# Patient Record
Sex: Female | Born: 1960
Health system: Southern US, Community
[De-identification: ages and names within clinical notes are randomized; demographics above are authoritative.]

## PROBLEM LIST (undated history)

## (undated) DIAGNOSIS — S82892A Other fracture of left lower leg, initial encounter for closed fracture: Secondary | ICD-10-CM

## (undated) DIAGNOSIS — K3184 Gastroparesis: Secondary | ICD-10-CM

## (undated) DIAGNOSIS — I779 Disorder of arteries and arterioles, unspecified: Secondary | ICD-10-CM

## (undated) DIAGNOSIS — N189 Chronic kidney disease, unspecified: Secondary | ICD-10-CM

## (undated) DIAGNOSIS — D72819 Decreased white blood cell count, unspecified: Secondary | ICD-10-CM

## (undated) DIAGNOSIS — I2510 Atherosclerotic heart disease of native coronary artery without angina pectoris: Secondary | ICD-10-CM

## (undated) DIAGNOSIS — N186 End stage renal disease: Secondary | ICD-10-CM

## (undated) DIAGNOSIS — I209 Angina pectoris, unspecified: Secondary | ICD-10-CM

## (undated) DIAGNOSIS — I739 Peripheral vascular disease, unspecified: Secondary | ICD-10-CM

## (undated) DIAGNOSIS — I10 Essential (primary) hypertension: Secondary | ICD-10-CM

## (undated) DIAGNOSIS — I509 Heart failure, unspecified: Secondary | ICD-10-CM

## (undated) DIAGNOSIS — E041 Nontoxic single thyroid nodule: Secondary | ICD-10-CM

## (undated) DIAGNOSIS — E1149 Type 2 diabetes mellitus with other diabetic neurological complication: Secondary | ICD-10-CM

## (undated) DIAGNOSIS — F329 Major depressive disorder, single episode, unspecified: Secondary | ICD-10-CM

## (undated) DIAGNOSIS — E785 Hyperlipidemia, unspecified: Secondary | ICD-10-CM

## (undated) DIAGNOSIS — Z992 Dependence on renal dialysis: Secondary | ICD-10-CM

## (undated) DIAGNOSIS — R1310 Dysphagia, unspecified: Secondary | ICD-10-CM

## (undated) DIAGNOSIS — H9190 Unspecified hearing loss, unspecified ear: Secondary | ICD-10-CM

## (undated) DIAGNOSIS — K589 Irritable bowel syndrome without diarrhea: Secondary | ICD-10-CM

## (undated) DIAGNOSIS — R0600 Dyspnea, unspecified: Secondary | ICD-10-CM

## (undated) DIAGNOSIS — R011 Cardiac murmur, unspecified: Secondary | ICD-10-CM

## (undated) DIAGNOSIS — B029 Zoster without complications: Secondary | ICD-10-CM

## (undated) DIAGNOSIS — D649 Anemia, unspecified: Secondary | ICD-10-CM

## (undated) HISTORY — DX: Other fracture of left lower leg, initial encounter for closed fracture: S82.892A

## (undated) HISTORY — DX: Hyperlipidemia, unspecified: E78.5

## (undated) HISTORY — PX: COLONOSCOPY: SHX174

## (undated) HISTORY — DX: Irritable bowel syndrome without diarrhea: K58.9

## (undated) HISTORY — DX: Zoster without complications: B02.9

## (undated) HISTORY — DX: Nontoxic single thyroid nodule: E04.1

## (undated) HISTORY — DX: Decreased white blood cell count, unspecified: D72.819

## (undated) HISTORY — DX: Anemia, unspecified: D64.9

---

## 1995-12-18 DIAGNOSIS — S82892A Other fracture of left lower leg, initial encounter for closed fracture: Secondary | ICD-10-CM

## 1995-12-18 HISTORY — DX: Other fracture of left lower leg, initial encounter for closed fracture: S82.892A

## 1999-11-06 ENCOUNTER — Other Ambulatory Visit: Admission: RE | Admit: 1999-11-06 | Discharge: 1999-11-06 | Payer: Self-pay | Admitting: Gynecology

## 2000-03-27 ENCOUNTER — Encounter (INDEPENDENT_AMBULATORY_CARE_PROVIDER_SITE_OTHER): Payer: Self-pay | Admitting: *Deleted

## 2000-03-27 ENCOUNTER — Other Ambulatory Visit: Admission: RE | Admit: 2000-03-27 | Discharge: 2000-03-27 | Payer: Self-pay | Admitting: Gynecology

## 2000-11-14 ENCOUNTER — Inpatient Hospital Stay (HOSPITAL_COMMUNITY): Admission: EM | Admit: 2000-11-14 | Discharge: 2000-11-15 | Payer: Self-pay | Admitting: Emergency Medicine

## 2000-12-17 DIAGNOSIS — K589 Irritable bowel syndrome without diarrhea: Secondary | ICD-10-CM

## 2000-12-17 HISTORY — DX: Irritable bowel syndrome, unspecified: K58.9

## 2001-03-03 ENCOUNTER — Encounter: Admission: RE | Admit: 2001-03-03 | Discharge: 2001-03-03 | Payer: Self-pay | Admitting: Family Medicine

## 2001-03-03 ENCOUNTER — Encounter: Payer: Self-pay | Admitting: Family Medicine

## 2001-03-04 ENCOUNTER — Other Ambulatory Visit: Admission: RE | Admit: 2001-03-04 | Discharge: 2001-03-04 | Payer: Self-pay | Admitting: *Deleted

## 2001-12-01 ENCOUNTER — Encounter: Payer: Self-pay | Admitting: Family Medicine

## 2001-12-01 ENCOUNTER — Encounter: Admission: RE | Admit: 2001-12-01 | Discharge: 2001-12-01 | Payer: Self-pay | Admitting: Family Medicine

## 2002-02-23 ENCOUNTER — Other Ambulatory Visit: Admission: RE | Admit: 2002-02-23 | Discharge: 2002-02-23 | Payer: Self-pay | Admitting: *Deleted

## 2002-02-27 ENCOUNTER — Encounter: Payer: Self-pay | Admitting: Family Medicine

## 2002-02-27 ENCOUNTER — Encounter: Admission: RE | Admit: 2002-02-27 | Discharge: 2002-02-27 | Payer: Self-pay | Admitting: Family Medicine

## 2002-10-22 ENCOUNTER — Observation Stay (HOSPITAL_COMMUNITY): Admission: RE | Admit: 2002-10-22 | Discharge: 2002-10-23 | Payer: Self-pay | Admitting: Obstetrics and Gynecology

## 2002-10-22 ENCOUNTER — Encounter (INDEPENDENT_AMBULATORY_CARE_PROVIDER_SITE_OTHER): Payer: Self-pay

## 2002-12-17 DIAGNOSIS — E041 Nontoxic single thyroid nodule: Secondary | ICD-10-CM

## 2002-12-17 DIAGNOSIS — H9190 Unspecified hearing loss, unspecified ear: Secondary | ICD-10-CM

## 2002-12-17 HISTORY — DX: Unspecified hearing loss, unspecified ear: H91.90

## 2002-12-17 HISTORY — DX: Nontoxic single thyroid nodule: E04.1

## 2003-04-16 ENCOUNTER — Encounter: Payer: Self-pay | Admitting: Family Medicine

## 2003-04-16 ENCOUNTER — Encounter: Admission: RE | Admit: 2003-04-16 | Discharge: 2003-04-16 | Payer: Self-pay | Admitting: Family Medicine

## 2003-08-26 ENCOUNTER — Other Ambulatory Visit: Admission: RE | Admit: 2003-08-26 | Discharge: 2003-08-26 | Payer: Self-pay | Admitting: Pediatrics

## 2003-12-18 HISTORY — PX: ABDOMINAL HYSTERECTOMY: SHX81

## 2004-04-05 ENCOUNTER — Encounter: Admission: RE | Admit: 2004-04-05 | Discharge: 2004-07-04 | Payer: Self-pay | Admitting: Family Medicine

## 2004-12-17 DIAGNOSIS — E785 Hyperlipidemia, unspecified: Secondary | ICD-10-CM

## 2004-12-17 DIAGNOSIS — I10 Essential (primary) hypertension: Secondary | ICD-10-CM

## 2004-12-17 DIAGNOSIS — E1149 Type 2 diabetes mellitus with other diabetic neurological complication: Secondary | ICD-10-CM

## 2004-12-17 DIAGNOSIS — D649 Anemia, unspecified: Secondary | ICD-10-CM

## 2004-12-17 HISTORY — DX: Anemia, unspecified: D64.9

## 2004-12-17 HISTORY — DX: Hyperlipidemia, unspecified: E78.5

## 2004-12-17 HISTORY — DX: Essential (primary) hypertension: I10

## 2004-12-17 HISTORY — DX: Type 2 diabetes mellitus with other diabetic neurological complication: E11.49

## 2005-05-22 ENCOUNTER — Other Ambulatory Visit: Admission: RE | Admit: 2005-05-22 | Discharge: 2005-05-22 | Payer: Self-pay | Admitting: *Deleted

## 2007-03-22 ENCOUNTER — Emergency Department (HOSPITAL_COMMUNITY): Admission: EM | Admit: 2007-03-22 | Discharge: 2007-03-22 | Payer: Self-pay | Admitting: Family Medicine

## 2007-04-11 ENCOUNTER — Observation Stay (HOSPITAL_COMMUNITY): Admission: EM | Admit: 2007-04-11 | Discharge: 2007-04-12 | Payer: Self-pay | Admitting: *Deleted

## 2007-12-18 DIAGNOSIS — B029 Zoster without complications: Secondary | ICD-10-CM

## 2007-12-18 HISTORY — DX: Zoster without complications: B02.9

## 2008-02-09 ENCOUNTER — Encounter: Admission: RE | Admit: 2008-02-09 | Discharge: 2008-02-09 | Payer: Self-pay | Admitting: Family Medicine

## 2008-06-28 ENCOUNTER — Ambulatory Visit: Payer: Self-pay | Admitting: Hematology

## 2008-07-08 LAB — CBC WITH DIFFERENTIAL/PLATELET
EOS%: 5.3 % (ref 0.0–7.0)
LYMPH%: 37.4 % (ref 14.0–48.0)
MCH: 28.4 pg (ref 26.0–34.0)
MCV: 84.7 fL (ref 81.0–101.0)
MONO%: 5.2 % (ref 0.0–13.0)
RBC: 4.41 10*6/uL (ref 3.70–5.32)
RDW: 13 % (ref 11.3–14.5)

## 2008-07-08 LAB — MORPHOLOGY

## 2008-07-08 LAB — COMPREHENSIVE METABOLIC PANEL
CO2: 26 mEq/L (ref 19–32)
Calcium: 9.8 mg/dL (ref 8.4–10.5)
Chloride: 103 mEq/L (ref 96–112)
Creatinine, Ser: 0.71 mg/dL (ref 0.40–1.20)
Glucose, Bld: 160 mg/dL — ABNORMAL HIGH (ref 70–99)
Sodium: 139 mEq/L (ref 135–145)
Total Bilirubin: 0.5 mg/dL (ref 0.3–1.2)
Total Protein: 7.7 g/dL (ref 6.0–8.3)

## 2008-07-08 LAB — LACTATE DEHYDROGENASE: LDH: 136 U/L (ref 94–250)

## 2008-11-12 ENCOUNTER — Ambulatory Visit: Payer: Self-pay | Admitting: Hematology

## 2009-01-17 ENCOUNTER — Ambulatory Visit: Payer: Self-pay | Admitting: Hematology

## 2010-01-19 ENCOUNTER — Emergency Department (HOSPITAL_COMMUNITY): Admission: EM | Admit: 2010-01-19 | Discharge: 2010-01-19 | Payer: Self-pay | Admitting: Emergency Medicine

## 2011-01-07 ENCOUNTER — Encounter: Payer: Self-pay | Admitting: Family Medicine

## 2011-03-09 LAB — DIFFERENTIAL
Basophils Absolute: 0.1 10*3/uL (ref 0.0–0.1)
Basophils Relative: 1 % (ref 0–1)
Neutro Abs: 6.4 10*3/uL (ref 1.7–7.7)
Neutrophils Relative %: 47 % (ref 43–77)

## 2011-03-09 LAB — CBC
MCHC: 33.5 g/dL (ref 30.0–36.0)
Platelets: 252 10*3/uL (ref 150–400)
RBC: 4.95 MIL/uL (ref 3.87–5.11)
RDW: 12.8 % (ref 11.5–15.5)

## 2011-03-09 LAB — BASIC METABOLIC PANEL
BUN: 6 mg/dL (ref 6–23)
Calcium: 9.4 mg/dL (ref 8.4–10.5)
GFR calc non Af Amer: >60 mL/min (ref >60)
Glucose, Bld: 236 mg/dL — ABNORMAL HIGH (ref 70–99)
Sodium: 136 mEq/L (ref 135–145)

## 2011-03-09 LAB — POCT CARDIAC MARKERS
CKMB, poc: <1 ng/mL — ABNORMAL LOW (ref 1.0–8.0)
Myoglobin, poc: 48.7 ng/mL (ref 12–200)
Troponin i, poc: <0.05 ng/mL (ref 0.00–0.09)

## 2011-05-04 NOTE — Op Note (Signed)
NAME:  Nancy [name], Nancy [name]                     ACCOUNT NO.:  [id number]   MEDICAL RECORD NO.:  [id number]                   PATIENT TYPE:  OBV   LOCATION:  9399                                 FACILITY:  [facility]   PHYSICIAN:  Eldred Manges, M.D.             DATE OF BIRTH:  [birth year]   DATE OF PROCEDURE:  10/22/2002  DATE OF DISCHARGE:                                 OPERATIVE REPORT   PREOPERATIVE DIAGNOSES:  1. Menorrhagia.  2. Dysmenhorrhea.   POSTOPERATIVE DIAGNOSES:  1. Menorrhagia.  2. Dysmenorrhea.  3. Simple left ovarian cyst.   OPERATION:  1. Total vaginal hysterectomy.  2. Aspiration of left ovarian cyst.   SURGEON:  Eldred Manges, M.D.   FIRST ASSISTANT:  Everett Graff, M.D.   ANESTHESIA:  General orotracheal.   ESTIMATED BLOOD LOSS:  100 cc.   COMPLICATIONS:  None.   FINDINGS:  The uterus was normal size.  The right ovary appeared within  normal limits.  The left ovary appeared within normal limits with a simple  cyst with clear straw colored fluid.   PROCEDURE:  The patient was taken to the operating room after appropriate  identification and placed on the operating table.  After the attainment of  general anesthesia she was placed in the lithotomy position.  The perineum  and vagina were prepped with Hibiclens as the patient has iodine allergy.  It was then draped as a sterile field.  A Foley catheter was inserted into  the bladder and connected to straight drainage.  A weighted speculum was  placed in the posterior vagina and Lahey tenaculum placed on the anterior  and posterior surfaces of the cervix.  The cervicovaginal mucosa was  injected with a dilute solution of Pitressin for a total of 20 cc.  The  cervix was then circumscribed and the anterior vaginal mucosa sharply  dissected off the anterior cervix.  The peritoneum was entered posteriorly.  The uterosacral ligaments on the right and left side were clamped, cut,  suture ligated, and  those sutures held.  The anterior vaginal mucosa and  bladder were further dissected off the anterior cervix and the paracervical  tissues were clamped, cut, and suture ligated and parametrial tissues  clamped, cut, and suture ligated.  The peritoneum was then entered.  The  uterus was inverted and the upper pedicles clamped, cut, suture ligated, and  tied with a free tie.  The uterus was removed from the operative field.  The  aforementioned ovarian cyst was then aspirated for a total of 6 cc of clear  straw colored fluid.  The upper pedicles were inspected and found to be  hemostatic.  The McCall culdoplasty suture was placed in the posterior  peritoneum through the posterior vagina incorporating the uterosacral  ligaments and the intervening posterior peritoneum.  This suture was held.  Vaginal angles were then created with the suture from the uterosacral  ligament on either side  going through the anterior and posterior surfaces of  the vaginal cuff and tied down at the vaginal angles and held.  The  remainder of the vaginal cuff was then closed with figure-of-eight sutures  incorporating the anterior vaginal mucosa, anterior peritoneum, posterior  peritoneum, and posterior vaginal mucosa in each figure-of-eight suture.  Once the entire cuff had been closed hemostasis was noted to be adequate and  the [facility] culdoplasty suture was tied down.  A vaginal pack which had been  moistened with Estrace cream was then placed in the vagina.  All sutures  used in this procedure were 0 Vicryl.  The patient was then awakened from  general anesthesia and taken to the recovery room in satisfactory condition  having tolerated the procedure well with sponge and instrument counts  correct.  Total urine output was 175 cc.                                               Eldred Manges, M.D.    VPH/MEDQ  D:  10/22/2002  T:  10/22/2002  Job:  [id number]

## 2011-05-04 NOTE — H&P (Signed)
NAME:  Nancy [name], STATZER NO.:  [id number]   MEDICAL RECORD NO.:  [id number]          PATIENT TYPE:  INP   LOCATION:  2030                         FACILITY:  [facility]   PHYSICIAN:  Sheila Oats, M.D.DATE OF BIRTH:  24-Oct-1961   DATE OF ADMISSION:  04/11/2007  DATE OF DISCHARGE:                              HISTORY & PHYSICAL   The patient's primary care physician is [facility].   CHIEF COMPLAINT:  Chest pain.   HISTORY OF PRESENT ILLNESS:  The patient is a 50 year old black female  with past medical history significant for type 2 diabetes mellitus,  hypertension, history of costochondritis, GERD, who presents with  complaints of chest pain x1-2 days.  She states that she was in her  usual state of health until the day prior to admission, when she began  having chest pain, right epicardial in location, 6-7/10 in intensity at  its worst, sharp sometimes but for the most part a dull ache.  She  reports that it seems to be slightly alleviated by rest although she  states that it was constant for the most part.  She admits to nausea but  no vomiting.  She denies shortness of breath, diaphoresis, and no  radiation.  She also denies cough, fever, dysuria, abdominal pain,  diarrhea, melena, and no hematochezia.  The patient reports that she had  a cardiac catheterization done in 2001 per Dr. Fransico Him and her  coronaries were within normal limits.   The patient was seen in the ER and point of care markers were negative.  Her chest x-ray was also negative for acute infiltrates.  A D-dimer was  noted to be elevated and a CT angiogram was done and per radiologist  negative for pulmonary embolus.  She is admitted to the [facility] service for further evaluation and management.   PAST MEDICAL HISTORY:  1. As stated above.  2. Depression.  3. History of right thyroid lobe adenoma.  4. Hypothyroidism.  5. History of migraine headaches.  6.  Irritable bowel syndrome.  7. Mitral valve prolapse.  8. History of otosclerosis with some bilateral hearing loss.  9. History of left ankle fracture.   MEDICATIONS:  Altace, Glucophage, Lipitor, Actos, hydrochlorothiazide,  Levoxyl.  The patient does not know the doses of any of her medications.   ALLERGIES/INTOLERANCES:  VICODIN and SULFA cause hives, IODINE causes  vomiting, ASPIRIN causes GI upset, and ROBITUSSIN causes severe GI  upset.   SOCIAL HISTORY:  She states that she smoked for one year and quit in  2007.  She denies alcohol.  She also denies illicit drug use.   FAMILY HISTORY:  Her sister is deceased and was waiting for a heart  transplant following a questioned MI.  Brother age 6 with questioned  coronary artery disease.  Family also positive for thyroid disease,  epilepsy, hypertension, sickle cell trait, breast cancer.   REVIEW OF SYSTEMS:  As per HPI, other review of systems negative.   PHYSICAL EXAMINATION:  GENERAL:  The patient is an overweight middle-  aged black female.  She is alert and appropriate,  in no apparent  distress.  VITAL SIGNS:  Her temperature is 97.3 with a blood pressure of 129/79,  pulse of 80, respiratory rate of 18, and her O2 saturation is 96%.  HEENT:  PERRL, EOMI, sclerae anicteric.  Moist mucous membranes, no oral  exudates.  NECK:  Supple, no adenopathy, no thyromegaly, and no JVD.  LUNGS:  Clear to auscultation bilaterally.  No crackles or wheezes.  CARDIOVASCULAR:  Regular rate and rhythm, normal S1, S2.  No gallops  appreciated.  ABDOMEN:  Soft, bowel sounds present, nontender, nondistended, no  organomegaly and no masses palpable.  EXTREMITIES:  No cyanosis and no edema.  NEUROLOGIC:  She is alert and oriented x3.  Cranial nerves II-XII  grossly intact.  SKIN:  On her upper chest area midline she has a few papular  erythematous lesions and also a few similar lesions on her left lateral  shoulder area, erythematous.    LABORATORY DATA:  Chest x-ray:  No acute infiltrates.  D-dimer 0.51.  Her CT angiogram shows mild diffuse glass opacities, question low volume  versus alveolitis.  Her point of care markers are negative x1.  Her  sodium is 140 with a potassium of 3.8, chloride 104, BUN is 4, glucose  156.  The pH is 7.24, pCO2 65, bicarb is 28.8.  Her white cell count is  7.7, hemoglobin 13.2, hematocrit 39.5, platelet count 285.   ASSESSMENT AND PLAN:  1. Chest pain.  Will obtain serial cardiac enzymes, EKG to rule out      myocardial infarction.  We will place on nitrates.  Noted patient      reports allergy/intolerance to aspirin.  Will continue ACE      inhibitor.  Will place the patient on a PPI to cover for      gastrointestinal etiology, noted history of gastroesophageal reflux      disease.  Will also place the patient on Toradol as she has a      history of costochondritis.  Per E-Chart she had a cardiac      catheterization done in 2001 following presentation with similar      complaints and her coronaries were reported to be normal.  2. Diabetes mellitus.  Monitor Accu-Checks.  Cover with sliding scale.      Continue Actos.  3. Hypertension.  Continue outpatient medications.  4. Rash/dermatitis, not otherwise specified.  Topical      steroids/symptomatic relief, and follow.  5. Hyperlipidemia.  Continue Lipitor.  6. Gastroesophageal reflux disease.  As above.  Place on PPI.      Sheila Oats, M.D.  Electronically Signed     ACV/MEDQ  D:  04/12/2007  T:  04/12/2007  Job:  [id number]   cc:   Fransico Him, M.D.  Development worker, community at [facility], [place].A.

## 2011-05-04 NOTE — Discharge Summary (Signed)
. [facility]  Patient:    Nancy [name], Nancy [name]                        MRN: [id number] Adm. Date:  [id number] Disc. Date: [id number] Attending:  Doyce Para Dictator:   Doyce Para, M.D. CC:         Priscille Heidelberg. Little, M.D.  Fransico Him, M.D.   Discharge Summary  DATE OF BIRTH:  1961-03-29  DISCHARGE DIAGNOSES:  1. Chest pain.     a. Noncardiac.     b. Negative cardiac catheterization.  2. Question gastroesophageal reflux disease.  3. Microcytic anemia.     a. ? secondary to menstruation.  4. Irritable bowel syndrome.     a. Sigmoidoscopy in 1998 (John N. Geri Seminole., M.D. [facility]).  5. Anxiety - depression, controlled.  6. Obesity.  7. Fibroid uterus.  8. Perimenopausal.  9. History of costochondritis. 10. Hypertension.  DISCHARGE MEDICATIONS: 1. (New) Protonix 40 mg q.d. 2. (New) Altace 5 mg q.d. 3. (New) Iron sulfate 325 mg p.o. t.i.d. 4. Colace 100 mg p.o. b.i.d. if constipated. 5. Paxil 20 mg q.d. 6. Levsin p.r.n.  CONDITION ON DISCHARGE:  Stable.  Groin site okay.  Pain-free.  ACTIVITY:  As tolerated.  DIET:  Low fat, low salt.  SPECIAL INSTRUCTIONS:  Contact Dr. Radford Pax if there are problems with the groin site such as bleeding, pain, or redness.  FOLLOW-UP: 1. Call [facility] and schedule a follow-up    appointment with Malachy Mood in one week to check blood pressure and kidneys    while she is on the ACE inhibitor. 2. Dr. Fransico Him on Friday, December 14, at 1:45 p.m.  Recheck blood    pressure.  CONSULTING PHYSICIANS:  Fransico Him, M.D.  PROCEDURE: 1. EKG (Novement 30) normal sinus rhythm with sinus arrhythmia, nonspecific    T wave abnormalities. 2. Chest x-ray; no significant abnormalities. 3. Cardiac catheterization (November 30): Normal.  No evidence of coronary    artery disease. Normal ejection fraction.  HOSPITAL COURSE:  #1 - Chest pain.  [name] is a 50 year old African-American female with  a history of irritable bowel syndrome and microcytic anemia who presents with substernal chest pressure radiating to her left arm and neck.  Her family history is significant for coronary artery disease.  She had a sister who died of an MI at age 38 and her brother had an MI at 82.  Initial EKG and enzymes were negative.  Fransico Him, M.D. was consulted and performed cardiac catheterization on November 15, 2000.  There was no evidence of coronary artery disease.  We empirically treated Ms. Lash for acid reflux and she will follow up with both Dr. Radford Pax and her primary care physician.  #2 - Hypertension.  We did start [name] on an ACE inhibitor prior to discharge.  Her blood pressure and renal function as well as potassium should be checked as an outpatient.  #3 - Microcytic anemia.  Going back through her old records, it is apparent that [name] has had an iron deficiency anemia previously attributed to heavy menstrual cycles.  She apparently has a history of fibroid uterus.  Her hemoglobin here was 10.8 with an MCV of 76.4.  Her ferritin was 6.  I did put her on iron replacement and given her age, would recommend guaiac cards as an outpatient.  Will leave to primary care physician.  DISCHARGE LABORATORY DATA:  TSH  1.975, ferritin 6.  Cholesterol 181, triglycerides 95, HDL 40, LDL 122.  Sodium 137, potassium 3.7, chloride 104, bicarb 30, glucose 105, BUN 9, creatinine 0.6. DD:  11/19/00 TD:  11/19/00 Job: [id number]

## 2011-05-04 NOTE — H&P (Signed)
NAME:  Nancy [name], Nancy [name]                     ACCOUNT NO.:  [id number]   MEDICAL RECORD NO.:  [id number]                   PATIENT TYPE:  AMB   LOCATION:  SDC                                  FACILITY:  [place]   PHYSICIAN:  Eldred Manges, M.D.             DATE OF BIRTH:  [birth year]   DATE OF ADMISSION:  10/16/2002  DATE OF DISCHARGE:                                HISTORY & PHYSICAL   HISTORY OF PRESENT ILLNESS:  The patient is a 50 year old, married, African-  American female,  P1-0-0-1 who presents for a vaginal hysterectomy because  of sever dysmenorrhea and menorrhagia.  For over the past two years, the  patient has had a history of oligomenorrhea (menses every two to three  months) followed by a 7- to 15-day menstrual period in which she changes a  super tampon every hour or less while passing quarter-sized clots.  Along  with her bleeding, the patient experiences menstrual cramps which require  narcotic analgesia.  The patient rates her cramps as a 9/10 on a 10 point  scale and will decrease that pain to 3/10 whenever she takes a narcotic pain  medicine.  On occasion, the patient has experienced such disruptive and  painful menstrual periods that she has to miss work.  Her pelvic ultrasound  in March 2003, showed a normal size uterus and normal ovaries.  A TSH done  at that same time was within normal limits in spite of the patient being  found to have a right lobe thyroid adenoma.  The patient underwent an  endometrial biopsy in September 2003, which revealed benign proliferative  endometrium.  After reviewing consideration of all her medical and surgical  options for menstrual management, the patient has consented to undergo  definitive treatment in the form of a hysterectomy.   PAST OBSTETRICAL HISTORY:  1. G1, P1-0-0-1.  2. Anemia.  3. Cesarean section.  4. Menarche at 50 years old.  5. Menstrual periods are as per history of present illness.  6. The patient uses  condoms for contraception.  7. She denies any history of sexually transmitted diseases or abnormal Pap     smears.  Her most recent Pap smear in March 2003, was within normal     limits.   PAST MEDICAL HISTORY:  1. Otosclerosis with hearing loss bilaterally.  2. Irritable bowel syndrome .  3. Gastroesophageal reflux disease.  4. Hypertension.  5. Right lobe thyroid adenoma with normal TSH.  6. Depression.  7. Left ankle fracture.  8. Migraine.   PAST SURGICAL HISTORY:  1. Cesarean section in 1983.  2. Cardiac catheterization in November 2000 (normal).  3. The patient denies any history of blood transfusions or problems with     anesthesia.   FAMILY HISTORY:  Positive for heart disease, thyroid disease, epilepsy,  hypertension, sickle cell trait, osteopenia and breast cancer (maternal  grandmother at age 27 and maternal aunt in early 42s).  MEDICATIONS:  1. Altace 5 mg daily.  2. Levsin 0.125 mg one to two tablets every four hours as needed.  3. Paxil 20 mg daily.  4. Proventil inhaler as needed.   ALLERGIES:  IODINE which causes severe vomiting.  SULFA causes hives.  ASPIRIN causes severe GI upset.  VICODIN causes hives.  ROBITUSSIN causes  severe GI upset.   HABITS:  The patient does not smoke and she drinks alcohol socially.   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   REVIEW OF SYMPTOMS:  Positive for seasonal allergies and arthralgias.  Otherwise, as in history of present illness, review of systems is negative.   PHYSICAL EXAMINATION:  VITAL SIGNS:  Blood pressure 120/80, weight 220  pounds, height 5 feet 4 inches tall.  NECK:  Supple without masses.  There is no adenopathy or thyromegaly.  HEART:  Regular rate and rhythm.  There is a 2/6 systolic ejection murmur at  the left sternal border.  LUNGS:  Wheezes in left lung field.  There are no rhonchi or rales.  BACK:  Without CVA tenderness.  ABDOMEN:  Bowel sounds present and soft  without tenderness, masses or  organomegaly.  EXTREMITIES:  Without clubbing, cyanosis or edema.  PELVIC:  EG/BUS within normal limits.  Vagina is rugous.  Cervix is  nontender without lesions.  Uterus appears normal size, shape and  consistency without tenderness (exam is limited by body habitus).  Adnexa  without tenderness or masses.  Rectovaginal without tenderness or masses.   IMPRESSION:  1. Severe dysmenorrhea.  2. Menorrhagia.   PLAN:  A long discussion was held with the patient regarding the medical and  surgical options for management of her menstrual symptoms, however, the  patient has consented to undergo a hysterectomy procedure.  The patient  understands the implications for the procedure along with the risk  associated which include, but are not limited to, reaction to anesthesia,  damage to adjacent organs, infection and bleeding.  The patient is scheduled  for a total vaginal hysterectomy at [facility] in [place] on  October 22, 2002, at 7:30 a.m.      [place] J. Elizebeth Koller.                    Eldred Manges, M.D.    EJP/MEDQ  D:  10/16/2002  T:  10/16/2002  Job:  [id number]

## 2011-08-24 ENCOUNTER — Other Ambulatory Visit: Payer: Self-pay | Admitting: Endocrinology

## 2012-02-15 ENCOUNTER — Other Ambulatory Visit: Payer: Self-pay | Admitting: Endocrinology

## 2012-02-15 DIAGNOSIS — Z1231 Encounter for screening mammogram for malignant neoplasm of breast: Secondary | ICD-10-CM

## 2012-02-26 ENCOUNTER — Ambulatory Visit
Admission: RE | Admit: 2012-02-26 | Discharge: 2012-02-26 | Disposition: A | Discharge status: Home / Self Care | Payer: 59 | Source: Ambulatory Visit | Attending: Endocrinology | Admitting: Endocrinology

## 2012-02-26 DIAGNOSIS — Z1231 Encounter for screening mammogram for malignant neoplasm of breast: Secondary | ICD-10-CM

## 2012-03-03 ENCOUNTER — Other Ambulatory Visit: Payer: Self-pay | Admitting: Endocrinology

## 2012-03-03 DIAGNOSIS — R928 Other abnormal and inconclusive findings on diagnostic imaging of breast: Secondary | ICD-10-CM

## 2012-03-05 ENCOUNTER — Ambulatory Visit
Admission: RE | Admit: 2012-03-05 | Discharge: 2012-03-05 | Disposition: A | Discharge status: Home / Self Care | Payer: 59 | Source: Ambulatory Visit | Attending: Endocrinology | Admitting: Endocrinology

## 2012-03-05 DIAGNOSIS — R928 Other abnormal and inconclusive findings on diagnostic imaging of breast: Secondary | ICD-10-CM

## 2012-12-17 DIAGNOSIS — F32A Depression, unspecified: Secondary | ICD-10-CM

## 2012-12-17 HISTORY — DX: Depression, unspecified: F32.A

## 2013-11-17 ENCOUNTER — Emergency Department (HOSPITAL_COMMUNITY): Payer: Self-pay

## 2013-11-17 ENCOUNTER — Emergency Department (HOSPITAL_COMMUNITY)
Admission: EM | Admit: 2013-11-17 | Discharge: 2013-11-17 | Disposition: A | Discharge status: Home / Self Care | Payer: Self-pay | Attending: Emergency Medicine | Admitting: Emergency Medicine

## 2013-11-17 ENCOUNTER — Encounter (HOSPITAL_COMMUNITY): Payer: Self-pay | Admitting: Emergency Medicine

## 2013-11-17 DIAGNOSIS — M79672 Pain in left foot: Secondary | ICD-10-CM

## 2013-11-17 DIAGNOSIS — I10 Essential (primary) hypertension: Secondary | ICD-10-CM | POA: Insufficient documentation

## 2013-11-17 DIAGNOSIS — F329 Major depressive disorder, single episode, unspecified: Secondary | ICD-10-CM | POA: Insufficient documentation

## 2013-11-17 DIAGNOSIS — F3289 Other specified depressive episodes: Secondary | ICD-10-CM | POA: Insufficient documentation

## 2013-11-17 DIAGNOSIS — M25579 Pain in unspecified ankle and joints of unspecified foot: Secondary | ICD-10-CM | POA: Insufficient documentation

## 2013-11-17 DIAGNOSIS — Z794 Long term (current) use of insulin: Secondary | ICD-10-CM | POA: Insufficient documentation

## 2013-11-17 DIAGNOSIS — E119 Type 2 diabetes mellitus without complications: Secondary | ICD-10-CM | POA: Insufficient documentation

## 2013-11-17 DIAGNOSIS — Z8669 Personal history of other diseases of the nervous system and sense organs: Secondary | ICD-10-CM | POA: Insufficient documentation

## 2013-11-17 DIAGNOSIS — Z79899 Other long term (current) drug therapy: Secondary | ICD-10-CM | POA: Insufficient documentation

## 2013-11-17 HISTORY — DX: Essential (primary) hypertension: I10

## 2013-11-17 HISTORY — DX: Major depressive disorder, single episode, unspecified: F32.9

## 2013-11-17 HISTORY — DX: Unspecified hearing loss, unspecified ear: H91.90

## 2013-11-17 MED ORDER — CLINDAMYCIN HCL 150 MG PO CAPS: 150.0000 mg | ORAL_CAPSULE | Freq: Four times a day (QID) | ORAL | Status: DC

## 2013-11-17 NOTE — ED Notes (Signed)
Pt presents with NAD. Pt does not know what she step on. Notice pain to left foot Friday. Has made several attempts to remove object with house hold items without success.

## 2013-11-17 NOTE — ED Provider Notes (Signed)
CSN: [id number]     Arrival date & time 11/17/13  1745 History   First MD Initiated Contact with Patient 11/17/13 1843     Chief Complaint  Patient presents with  . Foot Pain  . Foreign Body in Skin    left foot since firday   (Consider location/radiation/quality/duration/timing/severity/associated sxs/prior Treatment) HPI Comments: Patient presents to the for evaluation of foot pain. Patient reports that she started having pain in the area of her heel of the left foot 4 days ago. The pain has worsened and she feels like there is something under the skin. She has got an area slightly trying to remove the object, has not found anything. Patient is a diabetic. There is no redness or swelling.  Patient is a 52 y.o. female presenting with lower extremity pain.  Foot Pain    Past Medical History  Diagnosis Date  . Diabetes mellitus without complication   . Hypertension   . Depression   . HOH (hard of hearing)    Past Surgical History  Procedure Laterality Date  . Cesarean section     No family history on file. History  Substance Use Topics  . Smoking status: Never Smoker   . Smokeless tobacco: Not on file  . Alcohol Use: No   OB History   Grav Para Term Preterm Abortions TAB SAB Ect Mult Living                 Review of Systems  Constitutional: Negative for fever.  Skin: Positive for wound.  All other systems reviewed and are negative.    Allergies  Hydrocodone and Sulfur  Home Medications   Current Outpatient Rx  Name  Route  Sig  Dispense  Refill  . amitriptyline (ELAVIL) 25 MG tablet   Oral   Take 25 mg by mouth at bedtime.         . Insulin Lispro Prot & Lispro (HUMALOG MIX 75/25 KWIKPEN) (75-25) 100 UNIT/ML SUPN   Subcutaneous   Inject 50 Units into the skin 2 (two) times daily.         Marland Kitchen lisinopril (PRINIVIL,ZESTRIL) 5 MG tablet   Oral   Take 5 mg by mouth daily.         . naproxen sodium (ANAPROX) 220 MG tablet   Oral   Take 220 mg by mouth  2 (two) times daily with a meal.         . pregabalin (LYRICA) 75 MG capsule   Oral   Take 75 mg by mouth daily.         . simvastatin (ZOCOR) 20 MG tablet   Oral   Take 20 mg by mouth every evening.         Marland Kitchen tetrahydrozoline (VISINE) 0.05 % ophthalmic solution   Both Eyes   Place 1 drop into both eyes daily.          BP 152/98  Pulse 97  Temp(Src) 98.1 F (36.7 C) (Oral)  Resp 18  Ht 5\' 3"  (1.6 m)  Wt 192 lb (87.091 kg)  BMI 34.02 kg/m2  SpO2 97% Physical Exam  Constitutional: She is oriented to person, place, and time. She appears well-developed and well-nourished. No distress.  HENT:  Head: Normocephalic and atraumatic.  Right Ear: Hearing normal.  Left Ear: Hearing normal.  Nose: Nose normal.  Mouth/Throat: Oropharynx is clear and moist and mucous membranes are normal.  Eyes: Conjunctivae and EOM are normal. Pupils are equal, round, and reactive to  light.  Neck: Normal range of motion. Neck supple.  Cardiovascular: Regular rhythm, S1 normal and S2 normal.  Exam reveals no gallop and no friction rub.   No murmur heard. Pulmonary/Chest: Effort normal and breath sounds normal. No respiratory distress. She exhibits no tenderness.  Abdominal: Soft. Normal appearance and bowel sounds are normal. There is no hepatosplenomegaly. There is no tenderness. There is no rebound, no guarding, no tenderness at McBurney's point and negative Murphy's sign. No hernia.  Musculoskeletal: Normal range of motion.  Neurological: She is alert and oriented to person, place, and time. She has normal strength. No cranial nerve deficit or sensory deficit. Coordination normal. GCS eye subscore is 4. GCS verbal subscore is 5. GCS motor subscore is 6.  Skin: Skin is warm, dry and intact. No rash noted. No cyanosis.     Psychiatric: She has a normal mood and affect. Her speech is normal and behavior is normal. Thought content normal.    ED Course  Procedures (including critical care  time) Labs Review Labs Reviewed - No data to display Imaging Review Dg Foot Complete Left  11/17/2013   CLINICAL DATA:  Left foot pain without trauma. Question foreign body in heel.  EXAM: LEFT FOOT - COMPLETE 3+ VIEW  COMPARISON:  None.  FINDINGS: No acute fracture or dislocation. Mild degenerative irregularity of the 1st metatarsal phalangeal joint. No radiopaque foreign object. Tiny Achilles and calcaneal spurs.  IMPRESSION: No acute osseous abnormality.   Electronically Signed   By: Abigail Miyamoto M.D.   On: 11/17/2013 19:24    EKG Interpretation   None       MDM  Diagnosis: Foot pain  Patient reports pain in the left heel that started on Friday. As the pain worsened, she started to feel like there might be something in the foot. She has been digging at the area where tweezers, but has not found anything. Examination does not reveal any obvious foreign bodies palpable or visible under the skin. X-ray does not show any foreign body or other acute abnormality. There is no sign of infection at this time. Patient was counseled to stop picking at the area and rest the area. Will empirically be covered with antibiotics. Return to the ER for increased pain, redness, drainage.    Orpah Greek, MD 11/17/13 1929

## 2013-12-17 DIAGNOSIS — D72819 Decreased white blood cell count, unspecified: Secondary | ICD-10-CM

## 2013-12-17 HISTORY — DX: Decreased white blood cell count, unspecified: D72.819

## 2014-08-04 ENCOUNTER — Inpatient Hospital Stay (HOSPITAL_COMMUNITY)
Admission: EM | Admit: 2014-08-04 | Discharge: 2014-08-08 | DRG: 638 | Disposition: A | Discharge status: Home Health | Payer: Managed Care, Other (non HMO) | Attending: Internal Medicine | Admitting: Internal Medicine

## 2014-08-04 ENCOUNTER — Emergency Department (HOSPITAL_COMMUNITY): Payer: Managed Care, Other (non HMO)

## 2014-08-04 ENCOUNTER — Encounter (HOSPITAL_COMMUNITY): Payer: Self-pay | Admitting: Emergency Medicine

## 2014-08-04 DIAGNOSIS — E039 Hypothyroidism, unspecified: Secondary | ICD-10-CM | POA: Diagnosis present

## 2014-08-04 DIAGNOSIS — L02439 Carbuncle of limb, unspecified: Secondary | ICD-10-CM | POA: Diagnosis present

## 2014-08-04 DIAGNOSIS — Z794 Long term (current) use of insulin: Secondary | ICD-10-CM

## 2014-08-04 DIAGNOSIS — K920 Hematemesis: Secondary | ICD-10-CM | POA: Diagnosis present

## 2014-08-04 DIAGNOSIS — A059 Bacterial foodborne intoxication, unspecified: Secondary | ICD-10-CM | POA: Diagnosis present

## 2014-08-04 DIAGNOSIS — F329 Major depressive disorder, single episode, unspecified: Secondary | ICD-10-CM | POA: Diagnosis present

## 2014-08-04 DIAGNOSIS — Z803 Family history of malignant neoplasm of breast: Secondary | ICD-10-CM | POA: Diagnosis not present

## 2014-08-04 DIAGNOSIS — R079 Chest pain, unspecified: Secondary | ICD-10-CM | POA: Diagnosis present

## 2014-08-04 DIAGNOSIS — I10 Essential (primary) hypertension: Secondary | ICD-10-CM | POA: Diagnosis present

## 2014-08-04 DIAGNOSIS — K3184 Gastroparesis: Secondary | ICD-10-CM | POA: Diagnosis present

## 2014-08-04 DIAGNOSIS — Z8249 Family history of ischemic heart disease and other diseases of the circulatory system: Secondary | ICD-10-CM | POA: Diagnosis not present

## 2014-08-04 DIAGNOSIS — L02429 Furuncle of limb, unspecified: Secondary | ICD-10-CM | POA: Diagnosis present

## 2014-08-04 DIAGNOSIS — E1065 Type 1 diabetes mellitus with hyperglycemia: Secondary | ICD-10-CM | POA: Diagnosis present

## 2014-08-04 DIAGNOSIS — E1110 Type 2 diabetes mellitus with ketoacidosis without coma: Secondary | ICD-10-CM | POA: Diagnosis present

## 2014-08-04 DIAGNOSIS — E785 Hyperlipidemia, unspecified: Secondary | ICD-10-CM | POA: Diagnosis present

## 2014-08-04 DIAGNOSIS — E872 Acidosis, unspecified: Secondary | ICD-10-CM

## 2014-08-04 DIAGNOSIS — E1049 Type 1 diabetes mellitus with other diabetic neurological complication: Secondary | ICD-10-CM | POA: Diagnosis present

## 2014-08-04 DIAGNOSIS — E876 Hypokalemia: Secondary | ICD-10-CM

## 2014-08-04 DIAGNOSIS — E1310 Other specified diabetes mellitus with ketoacidosis without coma: Secondary | ICD-10-CM

## 2014-08-04 DIAGNOSIS — E860 Dehydration: Secondary | ICD-10-CM | POA: Diagnosis present

## 2014-08-04 DIAGNOSIS — R779 Abnormality of plasma protein, unspecified: Secondary | ICD-10-CM

## 2014-08-04 DIAGNOSIS — B9789 Other viral agents as the cause of diseases classified elsewhere: Secondary | ICD-10-CM | POA: Diagnosis present

## 2014-08-04 DIAGNOSIS — Z833 Family history of diabetes mellitus: Secondary | ICD-10-CM | POA: Diagnosis not present

## 2014-08-04 DIAGNOSIS — R0789 Other chest pain: Secondary | ICD-10-CM

## 2014-08-04 DIAGNOSIS — E1365 Other specified diabetes mellitus with hyperglycemia: Secondary | ICD-10-CM

## 2014-08-04 DIAGNOSIS — K589 Irritable bowel syndrome without diarrhea: Secondary | ICD-10-CM | POA: Diagnosis present

## 2014-08-04 DIAGNOSIS — F3289 Other specified depressive episodes: Secondary | ICD-10-CM | POA: Diagnosis present

## 2014-08-04 DIAGNOSIS — E1010 Type 1 diabetes mellitus with ketoacidosis without coma: Principal | ICD-10-CM

## 2014-08-04 DIAGNOSIS — E1349 Other specified diabetes mellitus with other diabetic neurological complication: Secondary | ICD-10-CM

## 2014-08-04 DIAGNOSIS — R799 Abnormal finding of blood chemistry, unspecified: Secondary | ICD-10-CM

## 2014-08-04 DIAGNOSIS — E1165 Type 2 diabetes mellitus with hyperglycemia: Secondary | ICD-10-CM

## 2014-08-04 DIAGNOSIS — H9190 Unspecified hearing loss, unspecified ear: Secondary | ICD-10-CM | POA: Diagnosis present

## 2014-08-04 DIAGNOSIS — E1149 Type 2 diabetes mellitus with other diabetic neurological complication: Secondary | ICD-10-CM | POA: Diagnosis present

## 2014-08-04 DIAGNOSIS — R112 Nausea with vomiting, unspecified: Secondary | ICD-10-CM

## 2014-08-04 DIAGNOSIS — R1115 Cyclical vomiting syndrome unrelated to migraine: Secondary | ICD-10-CM

## 2014-08-04 DIAGNOSIS — IMO0002 Reserved for concepts with insufficient information to code with codable children: Secondary | ICD-10-CM

## 2014-08-04 HISTORY — DX: Type 2 diabetes mellitus with other diabetic neurological complication: E11.49

## 2014-08-04 LAB — BASIC METABOLIC PANEL
Anion gap: 13 (ref 5–15)
BUN: 10 mg/dL (ref 6–23)
CHLORIDE: 101 meq/L (ref 96–112)
CO2: 26 mEq/L (ref 19–32)
Calcium: 9.7 mg/dL (ref 8.4–10.5)
Creatinine, Ser: 0.58 mg/dL (ref 0.50–1.10)
GFR calc Af Amer: >90 mL/min (ref >90)
Glucose, Bld: 226 mg/dL — ABNORMAL HIGH (ref 70–99)
POTASSIUM: 3.7 meq/L (ref 3.7–5.3)
SODIUM: 140 meq/L (ref 137–147)

## 2014-08-04 LAB — TROPONIN I
Troponin I: <0.3 ng/mL (ref <0.30)
Troponin I: <0.3 ng/mL (ref <0.30)

## 2014-08-04 LAB — I-STAT TROPONIN, ED: Troponin i, poc: 0 ng/mL (ref 0.00–0.08)

## 2014-08-04 LAB — COMPREHENSIVE METABOLIC PANEL
ALBUMIN: 3.9 g/dL (ref 3.5–5.2)
ALT: 13 U/L (ref 0–35)
ANION GAP: 20 — AB (ref 5–15)
AST: 30 U/L (ref 0–37)
Alkaline Phosphatase: 86 U/L (ref 39–117)
BILIRUBIN TOTAL: 0.7 mg/dL (ref 0.3–1.2)
BUN: 10 mg/dL (ref 6–23)
CO2: 17 mEq/L — ABNORMAL LOW (ref 19–32)
Calcium: 10.2 mg/dL (ref 8.4–10.5)
Chloride: 95 mEq/L — ABNORMAL LOW (ref 96–112)
Creatinine, Ser: 0.43 mg/dL — ABNORMAL LOW (ref 0.50–1.10)
GFR calc Af Amer: >90 mL/min (ref >90)
GFR calc non Af Amer: >90 mL/min (ref >90)
Glucose, Bld: 376 mg/dL — ABNORMAL HIGH (ref 70–99)
POTASSIUM: 5.3 meq/L (ref 3.7–5.3)
Sodium: 132 mEq/L — ABNORMAL LOW (ref 137–147)
Total Protein: 8.9 g/dL — ABNORMAL HIGH (ref 6.0–8.3)

## 2014-08-04 LAB — URINALYSIS, ROUTINE W REFLEX MICROSCOPIC
BILIRUBIN URINE: NEGATIVE
Hgb urine dipstick: NEGATIVE
Ketones, ur: NEGATIVE mg/dL
Leukocytes, UA: NEGATIVE
NITRITE: NEGATIVE
PH: 7 (ref 5.0–8.0)
Protein, ur: NEGATIVE mg/dL
Specific Gravity, Urine: 1.03 (ref 1.005–1.030)
Urobilinogen, UA: 0.2 mg/dL (ref 0.0–1.0)

## 2014-08-04 LAB — CBC WITH DIFFERENTIAL/PLATELET
BASOS PCT: 0 % (ref 0–1)
Basophils Absolute: 0 10*3/uL (ref 0.0–0.1)
Eosinophils Absolute: 0 10*3/uL (ref 0.0–0.7)
Eosinophils Relative: 0 % (ref 0–5)
HCT: 43.7 % (ref 36.0–46.0)
HEMOGLOBIN: 15.3 g/dL — AB (ref 12.0–15.0)
LYMPHS ABS: 2.1 10*3/uL (ref 0.7–4.0)
LYMPHS PCT: 17 % (ref 12–46)
MCH: 28.2 pg (ref 26.0–34.0)
MCHC: 35 g/dL (ref 30.0–36.0)
MCV: 80.5 fL (ref 78.0–100.0)
Monocytes Absolute: 0.5 10*3/uL (ref 0.1–1.0)
Monocytes Relative: 4 % (ref 3–12)
NEUTROS PCT: 79 % — AB (ref 43–77)
Neutro Abs: 9.9 10*3/uL — ABNORMAL HIGH (ref 1.7–7.7)
Platelets: 301 10*3/uL (ref 150–400)
RBC: 5.43 MIL/uL — AB (ref 3.87–5.11)
RDW: 12.6 % (ref 11.5–15.5)
WBC: 12.5 10*3/uL — AB (ref 4.0–10.5)

## 2014-08-04 LAB — CBG MONITORING, ED
GLUCOSE-CAPILLARY: 322 mg/dL — AB (ref 70–99)
Glucose-Capillary: 338 mg/dL — ABNORMAL HIGH (ref 70–99)
Glucose-Capillary: 311 mg/dL — ABNORMAL HIGH (ref 70–99)

## 2014-08-04 LAB — OCCULT BLOOD GASTRIC / DUODENUM (SPECIMEN CUP)
OCCULT BLOOD, GASTRIC: POSITIVE — AB
pH, Gastric: 4

## 2014-08-04 LAB — URINE MICROSCOPIC-ADD ON

## 2014-08-04 LAB — GLUCOSE, CAPILLARY
GLUCOSE-CAPILLARY: 204 mg/dL — AB (ref 70–99)
Glucose-Capillary: 235 mg/dL — ABNORMAL HIGH (ref 70–99)

## 2014-08-04 LAB — LIPASE, BLOOD: Lipase: 15 U/L (ref 11–59)

## 2014-08-04 MED ORDER — SODIUM CHLORIDE 0.9 % IV BOLUS (SEPSIS)
1000.0000 mL | Freq: Once | INTRAVENOUS | Status: AC
  Administered 2014-08-04: 1000 mL via INTRAVENOUS

## 2014-08-04 MED ORDER — OXYCODONE HCL 5 MG PO TABS
5.0000 mg | ORAL_TABLET | ORAL | Status: DC | PRN
  Administered 2014-08-07: 5 mg via ORAL
  Filled 2014-08-04 (×3): qty 1

## 2014-08-04 MED ORDER — HYDROMORPHONE HCL PF 1 MG/ML IJ SOLN
0.5000 mg | Freq: Once | INTRAMUSCULAR | Status: DC
  Filled 2014-08-04: qty 1

## 2014-08-04 MED ORDER — ONDANSETRON HCL 4 MG/2ML IJ SOLN
4.0000 mg | Freq: Three times a day (TID) | INTRAMUSCULAR | Status: DC | PRN
  Administered 2014-08-04: 4 mg via INTRAVENOUS
  Filled 2014-08-04: qty 2

## 2014-08-04 MED ORDER — ASPIRIN 81 MG PO CHEW
324.0000 mg | CHEWABLE_TABLET | Freq: Once | ORAL | Status: AC
  Administered 2014-08-04: 324 mg via ORAL
  Filled 2014-08-04: qty 4

## 2014-08-04 MED ORDER — SODIUM CHLORIDE 0.9 % IV SOLN
INTRAVENOUS | Status: DC
Start: 2014-08-04 — End: 2014-08-06
  Administered 2014-08-04 – 2014-08-06 (×4): via INTRAVENOUS

## 2014-08-04 MED ORDER — ALUM & MAG HYDROXIDE-SIMETH 200-200-20 MG/5ML PO SUSP: 30.0000 mL | Freq: Four times a day (QID) | ORAL | Status: DC | PRN

## 2014-08-04 MED ORDER — MORPHINE SULFATE 4 MG/ML IJ SOLN
4.0000 mg | INTRAMUSCULAR | Status: DC | PRN
  Administered 2014-08-04 – 2014-08-08 (×11): 4 mg via INTRAVENOUS
  Filled 2014-08-04 (×11): qty 1

## 2014-08-04 MED ORDER — INSULIN GLARGINE 100 UNIT/ML ~~LOC~~ SOLN
25.0000 [IU] | Freq: Every day | SUBCUTANEOUS | Status: DC
  Administered 2014-08-04: 25 [IU] via SUBCUTANEOUS
  Filled 2014-08-04: qty 0.25

## 2014-08-04 MED ORDER — HYDROMORPHONE HCL PF 1 MG/ML IJ SOLN
1.0000 mg | INTRAMUSCULAR | Status: DC | PRN
Start: 2014-08-04 — End: 2014-08-04

## 2014-08-04 MED ORDER — ONDANSETRON HCL 4 MG/2ML IJ SOLN
4.0000 mg | Freq: Four times a day (QID) | INTRAMUSCULAR | Status: DC | PRN
  Administered 2014-08-04 – 2014-08-08 (×7): 4 mg via INTRAVENOUS
  Filled 2014-08-04 (×8): qty 2

## 2014-08-04 MED ORDER — FLUCONAZOLE IN SODIUM CHLORIDE 200-0.9 MG/100ML-% IV SOLN
200.0000 mg | INTRAVENOUS | Status: DC
  Administered 2014-08-04 – 2014-08-07 (×4): 200 mg via INTRAVENOUS
  Filled 2014-08-04 (×5): qty 100

## 2014-08-04 MED ORDER — LISINOPRIL 5 MG PO TABS
5.0000 mg | ORAL_TABLET | Freq: Every day | ORAL | Status: DC
  Administered 2014-08-04 – 2014-08-08 (×5): 5 mg via ORAL
  Filled 2014-08-04 (×5): qty 1

## 2014-08-04 MED ORDER — ACETAMINOPHEN 650 MG RE SUPP: 650.0000 mg | Freq: Four times a day (QID) | RECTAL | Status: DC | PRN

## 2014-08-04 MED ORDER — ENOXAPARIN SODIUM 40 MG/0.4ML ~~LOC~~ SOLN
40.0000 mg | SUBCUTANEOUS | Status: DC
  Administered 2014-08-04 – 2014-08-05 (×2): 40 mg via SUBCUTANEOUS
  Filled 2014-08-04 (×3): qty 0.4

## 2014-08-04 MED ORDER — PANTOPRAZOLE SODIUM 40 MG IV SOLR
40.0000 mg | Freq: Every day | INTRAVENOUS | Status: DC
  Administered 2014-08-04 – 2014-08-05 (×2): 40 mg via INTRAVENOUS
  Filled 2014-08-04 (×2): qty 40

## 2014-08-04 MED ORDER — PROMETHAZINE HCL 25 MG/ML IJ SOLN
12.5000 mg | INTRAMUSCULAR | Status: DC | PRN
  Administered 2014-08-04 – 2014-08-07 (×7): 12.5 mg via INTRAVENOUS
  Filled 2014-08-04 (×7): qty 1

## 2014-08-04 MED ORDER — PROMETHAZINE HCL 25 MG/ML IJ SOLN
12.5000 mg | Freq: Once | INTRAMUSCULAR | Status: AC
  Administered 2014-08-04: 12.5 mg via INTRAVENOUS
  Filled 2014-08-04: qty 1

## 2014-08-04 MED ORDER — INSULIN ASPART 100 UNIT/ML ~~LOC~~ SOLN
0.0000 [IU] | Freq: Every day | SUBCUTANEOUS | Status: DC
  Administered 2014-08-04: 2 [IU] via SUBCUTANEOUS

## 2014-08-04 MED ORDER — INSULIN ASPART 100 UNIT/ML ~~LOC~~ SOLN
0.0000 [IU] | Freq: Three times a day (TID) | SUBCUTANEOUS | Status: DC
  Administered 2014-08-04: 5 [IU] via SUBCUTANEOUS

## 2014-08-04 MED ORDER — ASPIRIN 81 MG PO CHEW
324.0000 mg | CHEWABLE_TABLET | Freq: Every day | ORAL | Status: DC
  Administered 2014-08-05: 324 mg via ORAL
  Filled 2014-08-04: qty 4

## 2014-08-04 MED ORDER — SODIUM CHLORIDE 0.9 % IJ SOLN
3.0000 mL | Freq: Two times a day (BID) | INTRAMUSCULAR | Status: DC
  Administered 2014-08-04 – 2014-08-06 (×2): 3 mL via INTRAVENOUS

## 2014-08-04 MED ORDER — NITROGLYCERIN 0.4 MG SL SUBL
0.4000 mg | SUBLINGUAL_TABLET | SUBLINGUAL | Status: DC | PRN
  Administered 2014-08-04 (×2): 0.4 mg via SUBLINGUAL
  Filled 2014-08-04: qty 1

## 2014-08-04 MED ORDER — NYSTATIN 100000 UNIT/GM EX POWD
1.0000 g | Freq: Two times a day (BID) | CUTANEOUS | Status: DC
  Administered 2014-08-04 – 2014-08-08 (×7): 1 g via TOPICAL
  Filled 2014-08-04: qty 15

## 2014-08-04 MED ORDER — ONDANSETRON HCL 4 MG PO TABS: 4.0000 mg | ORAL_TABLET | Freq: Four times a day (QID) | ORAL | Status: DC | PRN

## 2014-08-04 MED ORDER — ONDANSETRON HCL 4 MG/2ML IJ SOLN
4.0000 mg | Freq: Once | INTRAMUSCULAR | Status: AC
  Administered 2014-08-04: 4 mg via INTRAVENOUS
  Filled 2014-08-04: qty 2

## 2014-08-04 MED ORDER — METOCLOPRAMIDE HCL 5 MG/ML IJ SOLN
5.0000 mg | Freq: Four times a day (QID) | INTRAMUSCULAR | Status: DC
  Administered 2014-08-04 – 2014-08-07 (×12): 5 mg via INTRAVENOUS
  Filled 2014-08-04 (×5): qty 1
  Filled 2014-08-04: qty 2
  Filled 2014-08-04 (×3): qty 1
  Filled 2014-08-04: qty 2
  Filled 2014-08-04: qty 1
  Filled 2014-08-04: qty 2
  Filled 2014-08-04 (×3): qty 1

## 2014-08-04 MED ORDER — ACETAMINOPHEN 325 MG PO TABS
650.0000 mg | ORAL_TABLET | Freq: Four times a day (QID) | ORAL | Status: DC | PRN
  Administered 2014-08-08: 650 mg via ORAL
  Filled 2014-08-04: qty 2

## 2014-08-04 MED ORDER — INSULIN ASPART 100 UNIT/ML ~~LOC~~ SOLN
10.0000 [IU] | Freq: Once | SUBCUTANEOUS | Status: AC
  Administered 2014-08-04: 10 [IU] via INTRAVENOUS
  Filled 2014-08-04: qty 1

## 2014-08-04 MED ORDER — INSULIN ASPART 100 UNIT/ML ~~LOC~~ SOLN
4.0000 [IU] | Freq: Three times a day (TID) | SUBCUTANEOUS | Status: DC
  Administered 2014-08-04: 4 [IU] via SUBCUTANEOUS

## 2014-08-04 NOTE — ED Notes (Signed)
In to ready pt for transport to floor. Pt actively vomited. Zofran given per PRN order.

## 2014-08-04 NOTE — Progress Notes (Signed)
Patient  had about 5-10 ml of emesis. Red in color.  PCP on call notified.

## 2014-08-04 NOTE — Plan of Care (Signed)
Problem: Phase I Progression Outcomes Goal: Nausea/vomiting controlled with antiemetics Outcome: Not Progressing Patient had nausea and vomiting once admitted to her room in 1413. Md notified/.

## 2014-08-04 NOTE — ED Notes (Signed)
Bed: WA16 Expected date:  Expected time:  Means of arrival:  Comments: EMS 

## 2014-08-04 NOTE — Progress Notes (Signed)
Patient's emesis was sent to the lab.

## 2014-08-04 NOTE — ED Notes (Signed)
Pt reports n/v starting early this am. Started taking clindamycin last Wed. Believes this is the cause of n/v. Denies abd pain other than r/t vomiting. Denies diarrhea. ems cbg 360. Took Humalog at 6am today. ems IV 20g R hand. 4mg  zofran en route.

## 2014-08-04 NOTE — ED Provider Notes (Signed)
CSN: [id number]     Arrival date & time 08/04/14  1013 History   First MD Initiated Contact with Patient 08/04/14 1019     Chief Complaint  Patient presents with  . Nausea  . Emesis     (Consider location/radiation/quality/duration/timing/severity/associated sxs/prior Treatment) HPI Comments: The patient is a 53 year old female past history hypertension, IBS, Mitral valve prolapse, diabetes, presenting emergency room chief complaint of nausea since approximately 0430 this morning. The patient reports 4 episodes of nonbloody emesis. The patient reports associate "agina" since onset of nausea. She reports associated shortness of breath. The patient reports last stress test 2007, negative. History of early MI in father, multiple brothers. She denies history of MI. Denies diarrhea, constipation, last bowel movement 2 days ago, nonbloody. She reports home glucose 170's normally.  States took 35 units of Humalog 75-25 at 0600.    The history is provided by the patient. No language interpreter was used.    Past Medical History  Diagnosis Date  . Diabetes mellitus without complication   . Hypertension   . Depression   . HOH (hard of hearing)   . Fracture of left ankle   . Shingles   . Hypothyroid   . Hyperlipidemia   . IBS (irritable bowel syndrome)   . Anemia   . Hemorrhoids   . Leukopenia    Past Surgical History  Procedure Laterality Date  . Cesarean section    . Abdominal hysterectomy     Family History  Problem Relation Age of Onset  . Hypertension Mother   . Diabetes Mellitus I Sister   . Breast cancer Maternal Aunt   . Breast cancer Maternal Grandmother   . Colon cancer Maternal Grandfather   . Heart attack Sister    History  Substance Use Topics  . Smoking status: Never Smoker   . Smokeless tobacco: Not on file  . Alcohol Use: No   OB History   Grav Para Term Preterm Abortions TAB SAB Ect Mult Living                 Review of Systems  Constitutional: Positive  for chills. Negative for fever.  Respiratory: Positive for shortness of breath. Negative for cough.   Cardiovascular: Positive for chest pain.  Gastrointestinal: Positive for nausea and vomiting. Negative for abdominal pain, diarrhea, constipation and blood in stool.  Genitourinary: Negative for dysuria and hematuria.      Allergies  Hydrocodone and Sulfur  Home Medications   Prior to Admission medications   Medication Sig Start Date End Date Taking? Authorizing Provider  amitriptyline (ELAVIL) 25 MG tablet Take 25 mg by mouth at bedtime.    Historical Provider, MD  clindamycin (CLEOCIN) 150 MG capsule Take 1 capsule (150 mg total) by mouth every 6 (six) hours. 11/17/13   Orpah Greek, MD  Insulin Lispro Prot & Lispro (HUMALOG MIX 75/25 KWIKPEN) (75-25) 100 UNIT/ML SUPN Inject 50 Units into the skin 2 (two) times daily.    Historical Provider, MD  lisinopril (PRINIVIL,ZESTRIL) 5 MG tablet Take 5 mg by mouth daily.    Historical Provider, MD  naproxen sodium (ANAPROX) 220 MG tablet Take 220 mg by mouth 2 (two) times daily with a meal.    Historical Provider, MD  pregabalin (LYRICA) 75 MG capsule Take 75 mg by mouth daily.    Historical Provider, MD  simvastatin (ZOCOR) 20 MG tablet Take 20 mg by mouth every evening.    Historical Provider, MD  tetrahydrozoline (VISINE) 0.05 %  ophthalmic solution Place 1 drop into both eyes daily.    Historical Provider, MD   BP 183/95  Pulse 100  Temp(Src) 98.8 F (37.1 C) (Oral)  Resp 16  SpO2 99% Physical Exam  Nursing note and vitals reviewed. Constitutional: She is oriented to person, place, and time. She appears well-developed and well-nourished. No distress.  Appears uncomfortable  HENT:  Head: Normocephalic and atraumatic.  Neck: Neck supple.  Cardiovascular: Regular rhythm.  Tachycardia present.   Murmur heard. Pulmonary/Chest: No respiratory distress. She has no decreased breath sounds. She has no wheezes. She has no rhonchi.  She exhibits no tenderness.  Patient is able to speak in complete sentences.   Abdominal: Soft. There is no tenderness. There is no rigidity, no rebound and no guarding.  Musculoskeletal: Normal range of motion.  Neurological: She is alert and oriented to person, place, and time.  Skin: Skin is warm. She is not diaphoretic.  Psychiatric: She has a normal mood and affect. Her behavior is normal.    ED Course  Procedures (including critical care time) Labs Review Results for orders placed during the hospital encounter of 08/04/14  CBC WITH DIFFERENTIAL      Result Value Ref Range   WBC 12.5 (*) 4.0 - 10.5 K/uL   RBC 5.43 (*) 3.87 - 5.11 MIL/uL   Hemoglobin 15.3 (*) 12.0 - 15.0 g/dL   HCT 43.7  36.0 - 46.0 %   MCV 80.5  78.0 - 100.0 fL   MCH 28.2  26.0 - 34.0 pg   MCHC 35.0  30.0 - 36.0 g/dL   RDW 12.6  11.5 - 15.5 %   Platelets 301  150 - 400 K/uL   Neutrophils Relative % 79 (*) 43 - 77 %   Neutro Abs 9.9 (*) 1.7 - 7.7 K/uL   Lymphocytes Relative 17  12 - 46 %   Lymphs Abs 2.1  0.7 - 4.0 K/uL   Monocytes Relative 4  3 - 12 %   Monocytes Absolute 0.5  0.1 - 1.0 K/uL   Eosinophils Relative 0  0 - 5 %   Eosinophils Absolute 0.0  0.0 - 0.7 K/uL   Basophils Relative 0  0 - 1 %   Basophils Absolute 0.0  0.0 - 0.1 K/uL  COMPREHENSIVE METABOLIC PANEL      Result Value Ref Range   Sodium 132 (*) 137 - 147 mEq/L   Potassium 5.3  3.7 - 5.3 mEq/L   Chloride 95 (*) 96 - 112 mEq/L   CO2 17 (*) 19 - 32 mEq/L   Glucose, Bld 376 (*) 70 - 99 mg/dL   BUN 10  6 - 23 mg/dL   Creatinine, Ser 0.43 (*) 0.50 - 1.10 mg/dL   Calcium 10.2  8.4 - 10.5 mg/dL   Total Protein 8.9 (*) 6.0 - 8.3 g/dL   Albumin 3.9  3.5 - 5.2 g/dL   AST 30  0 - 37 U/L   ALT 13  0 - 35 U/L   Alkaline Phosphatase 86  39 - 117 U/L   Total Bilirubin 0.7  0.3 - 1.2 mg/dL   GFR calc non Af Amer >90  >90 mL/min   GFR calc Af Amer >90  >90 mL/min   Anion gap 20 (*) 5 - 15  URINALYSIS, ROUTINE W REFLEX MICROSCOPIC       Result Value Ref Range   Color, Urine YELLOW  YELLOW   APPearance CLEAR  CLEAR   Specific Gravity, Urine 1.030  1.005 - 1.030   pH 7.0  5.0 - 8.0   Glucose, UA >1000 (*) NEGATIVE mg/dL   Hgb urine dipstick NEGATIVE  NEGATIVE   Bilirubin Urine NEGATIVE  NEGATIVE   Ketones, ur NEGATIVE  NEGATIVE mg/dL   Protein, ur NEGATIVE  NEGATIVE mg/dL   Urobilinogen, UA 0.2  0.0 - 1.0 mg/dL   Nitrite NEGATIVE  NEGATIVE   Leukocytes, UA NEGATIVE  NEGATIVE  LIPASE, BLOOD      Result Value Ref Range   Lipase 15  11 - 59 U/L  URINE MICROSCOPIC-ADD ON      Result Value Ref Range   Squamous Epithelial / LPF RARE  RARE   WBC, UA 3-6  <3 WBC/hpf  TROPONIN I      Result Value Ref Range   Troponin I <0.30  <0.30 ng/mL  CBG MONITORING, ED      Result Value Ref Range   Glucose-Capillary 338 (*) 70 - 99 mg/dL  I-STAT TROPOININ, ED      Result Value Ref Range   Troponin i, poc 0.00  0.00 - 0.08 ng/mL   Comment 3           CBG MONITORING, ED      Result Value Ref Range   Glucose-Capillary 322 (*) 70 - 99 mg/dL  CBG MONITORING, ED      Result Value Ref Range   Glucose-Capillary 311 (*) 70 - 99 mg/dL   Comment 1 Notify RN     Dg Chest 2 View  08/04/2014   CLINICAL DATA:  Nausea.  Emesis.  EXAM: CHEST  2 VIEW  COMPARISON:  01/19/2010.  FINDINGS: Mediastinum and hilar structures are normal. Subsegmental atelectasis lung bases. No pleural effusion or pneumothorax. No acute osseus abnormality.  IMPRESSION: Subsegmental atelectasis both lung bases. Exam otherwise unremarkable.   Electronically Signed   By: Marcello Moores  Register   On: 08/04/2014 12:00  Imaging Review No results found.   EKG Interpretation   Date/Time:  Wednesday August 04 2014 10:42:48 EDT Ventricular Rate:  98 PR Interval:  143 QRS Duration: 82 QT Interval:  350 QTC Calculation: 447 R Axis:   -55 Text Interpretation:  Sinus rhythm Inferior infarct, old Consider anterior  infarct Sinus rhythm Artifact T wave abnormality Abnormal ekg  Confirmed by  Carmin Muskrat  MD [phone]) on 08/04/2014 10:47:36 AM      MDM   Final diagnoses:  Diabetic ketoacidosis without coma associated with other specified diabetes mellitus  Other chest pain   Patient presents with chest discomfort and nausea, CBG 3:30 on arrival fluid given. Multiple risk factors for coronary disease, ASA, nitroglycerin given, EMR shows normal catheterization 2001, last stress test 2007/2008.  EKG is shows nonspecific T wave abnormalities, initial troponin negative.  CMP shows anion gap of 20, second bolus ordered. No obvious precipitating cause (negative chest XR, UA) Repeat CBG 311. Second bolus ordered. Re-eval pt sleeping in room reports resolution of abdominal discomfort and chest discomfort. Plan to admit the patient for DKA and ACS and workup. Discussed with Dr. Rockne Menghini, who agrees for admission.   Harvie Heck, PA-C 08/04/14 1623

## 2014-08-04 NOTE — H&P (Signed)
History and Physical:    Nancy [name] [id number] DOB: 05-Jul-1961 DOA: 08/04/2014  Referring provider: Harvie Heck, PA-C PCP: Jacelyn Pi, MD   Chief Complaint: Nausea and vomiting  History of Present Illness:   Nancy [name] is an 53 y.o. female with a PMH of DM, HTN, hyperlipidemia who presents with a chief complaint of several episodes of nausea and vomiting followed by chest pain radiating to the "middle of my back".  Has had a total of 7 episodes of non-bloody vomiting, but no associated diarrhea.  She recently was put on Clindamycin for treatment of furuncles involving the axilla.  The patient also endorses some shortness of breath associated with episodes of vomiting and chest pain. She has multiple cardiac risk factors including positive family history, diabetes, hypertension, hyperlipidemia but reports a negative stress test in 2007 and a prior cardiac cath many years ago that was negative for obstructive pathology. She has not had any recent followup with her PCP secondary to insurance restrictions/finances that require her to pay out of pocket and then get reimbursed.  She is currently chest pain free.  ROS:   Constitutional: No fever, no chills;  Appetite diminished; + 40 lb weight loss over the past year, + fatigue.  HEENT: + blurry vision, no diplopia, no pharyngitis, no dysphagia CV: + chest pain, + chronic palpitations, no PND, no orthopnea, no edema.  Resp: + SOB, no cough, no pleuritic pain. GI: + nausea, + vomiting, no diarrhea, no melena, no hematochezia, no constipation, no abdominal pain.  GU: No dysuria, no hematuria, + frequency, no urgency. MSK: + myalgias, + arthralgias.  Neuro:  No headache, no focal neurological deficits, no history of seizures, + neuropathy.  Psych: + depression, + anxiety.  Endo: No heat intolerance, no cold intolerance, + polyuria, + polydipsia  Skin: No rashes, + axilla skin furuncles.  Heme: No easy bruising.  Travel history:  No recent travel.    Past Medical History:   Past Medical History  Diagnosis Date  . Diabetes mellitus with neurological manifestation   . Hypertension   . Depression   . HOH (hard of hearing)   . Fracture of left ankle   . Shingles   . Hypothyroid   . Hyperlipidemia   . IBS (irritable bowel syndrome)   . Anemia   . Hemorrhoids   . Leukopenia     Past Surgical History:   Past Surgical History  Procedure Laterality Date  . Cesarean section    . Abdominal hysterectomy      Social History:   History   Social History  . Marital Status: Married    Spouse Name: [name]    Number of Children: 1  . Years of Education: N/A   Occupational History  . Employed FT as Landscape architect    Social History Main Topics  . Smoking status: Never Smoker   . Smokeless tobacco: Never Used  . Alcohol Use: No  . Drug Use: No  . Sexual Activity: Not Currently   Other Topics Concern  . Not on file   Social History Narrative   Married.  Lives with husband and daughter.  Works full time in Engineer, structural (high stress).    Family history:   Family History  Problem Relation Age of Onset  . Hypertension Mother   . Diabetes Mellitus I Sister   . Breast cancer Maternal Aunt   . Breast cancer Maternal Grandmother   . Colon cancer Maternal Grandfather   .  Heart attack Sister     Allergies   Hydrocodone and Sulfur  Current Medications:   Prior to Admission medications   Medication Sig Start Date End Date Taking? Authorizing Provider  clindamycin (CLEOCIN) 75 MG capsule Take 75 mg by mouth 4 (four) times daily.   Yes Historical Provider, MD  Insulin Lispro Prot & Lispro (HUMALOG MIX 75/25 KWIKPEN) (75-25) 100 UNIT/ML SUPN Inject 35 Units into the skin 2 (two) times daily.    Yes Historical Provider, MD  lisinopril (PRINIVIL,ZESTRIL) 5 MG tablet Take 5 mg by mouth daily.   Yes Historical Provider, MD  nystatin (MYCOSTATIN/NYSTOP) 100000 UNIT/GM POWD Apply 1 g topically 2  (two) times daily.   Yes Historical Provider, MD    Physical Exam:   Filed Vitals:   08/04/14 1105 08/04/14 1113 08/04/14 1342 08/04/14 1408  BP: 155/87 138/75 149/92   Pulse: 104 106 98   Temp:   98.2 F (36.8 C)   TempSrc:   Oral   Resp: 19 21 18    Height:    5\' 3"  (1.6 m)  Weight:    88.905 kg (196 lb)  SpO2: 97% 100% 98%      Physical Exam: Blood pressure 149/92, pulse 98, temperature 98.2 F (36.8 C), temperature source Oral, resp. rate 18, height 5\' 3"  (1.6 m), weight 88.905 kg (196 lb), SpO2 98.00%. Gen: No acute distress. Head: Normocephalic, atraumatic. Eyes: PERRL, EOMI, sclerae nonicteric. Mouth: Oropharynx clear with dry mucous membranes. Neck: Supple, no thyromegaly, no lymphadenopathy, no jugular venous distention. Chest: Lungs CTAB. CV: Heart sounds are RRR, no M/R/G. Abdomen: Soft, nontender, nondistended with normal active bowel sounds. Extremities: Extremities are without C/E/C. Skin: Warm and dry.  Left axilla with yeast, draining sinus tract, furuncles right axilla. Neuro: Alert and oriented times 3; cranial nerves II through XII grossly intact. Psych: Mood and affect normal.   Data Review:    Labs: Basic Metabolic Panel:  Recent Labs Lab 08/04/14 1104  NA 132*  K 5.3  CL 95*  CO2 17*  GLUCOSE 376*  BUN 10  CREATININE 0.43*  CALCIUM 10.2   Liver Function Tests:  Recent Labs Lab 08/04/14 1104  AST 30  ALT 13  ALKPHOS 86  BILITOT 0.7  PROT 8.9*  ALBUMIN 3.9    Recent Labs Lab 08/04/14 1104  LIPASE 15   CBC:  Recent Labs Lab 08/04/14 1104  WBC 12.5*  NEUTROABS 9.9*  HGB 15.3*  HCT 43.7  MCV 80.5  PLT 301    CBG:  Recent Labs Lab 08/04/14 1028 08/04/14 1209 08/04/14 1216  GLUCAP 338* 322* 311*    Radiographic Studies: Dg Chest 2 View  08/04/2014   CLINICAL DATA:  Nausea.  Emesis.  EXAM: CHEST  2 VIEW  COMPARISON:  01/19/2010.  FINDINGS: Mediastinum and hilar structures are normal. Subsegmental atelectasis  lung bases. No pleural effusion or pneumothorax. No acute osseus abnormality.  IMPRESSION: Subsegmental atelectasis both lung bases. Exam otherwise unremarkable.   Electronically Signed   By: Marcello Moores  Register   On: 08/04/2014 12:00    EKG: Independently reviewed. NSR at 98 bpm, non-specific TWA.   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  Lipase WNL, exam benign.  Treat symptomatically with IVF, anti-emetics, reglan (may have a component of gastroparesis).  Further imaging if symptoms do not resolve with conservative measures, correction of mild DKA.  Vigorously hydrated with 2 L NS, continue IVF at 125 cc/hr.  Will use basal/bolus insulin for now, and obtain DM coordinator consultation.  Patient states her CBGs have been uncontrolled for 2 years.  Check hemoglobin A1c.  Repeat BMET at 6 PM.  If DKA not resolved, may need insulin drip.  Active Problems:   Chest pain  Atypical, likely from episodes of nausea/vomiting.  Cycle troponins Q 6 hours x 3.  Daily ASA ordered and PRN NTG.    Furuncle of axilla  D/C Clindamycin.  Suspect hiadenitis, which are sterile abscesses.  Treat yeast superinfection of left axilla with IV diflucan.    Elevated serum protein level  Likely secondary to dehydration.  Re-check in a.m.    DVT prophylaxis  Lovenox ordered.  Code Status: Full. Family Communication: [name] (husband) [phone] is emergency contact.   Disposition Plan: Home when stable.  Time spent: 70 minutes.  Janyth Riera [facility] Pager [phone] Cell: [phone]   If 7PM-7AM, please contact night-coverage [email or web address] Password [email or web address] 08/04/2014, 3:01 PM

## 2014-08-05 ENCOUNTER — Inpatient Hospital Stay (HOSPITAL_COMMUNITY): Payer: Managed Care, Other (non HMO)

## 2014-08-05 DIAGNOSIS — K920 Hematemesis: Secondary | ICD-10-CM | POA: Diagnosis present

## 2014-08-05 DIAGNOSIS — E785 Hyperlipidemia, unspecified: Secondary | ICD-10-CM

## 2014-08-05 DIAGNOSIS — R1115 Cyclical vomiting syndrome unrelated to migraine: Secondary | ICD-10-CM

## 2014-08-05 DIAGNOSIS — R110 Nausea: Secondary | ICD-10-CM

## 2014-08-05 LAB — COMPREHENSIVE METABOLIC PANEL
ALT: 9 U/L (ref 0–35)
AST: 9 U/L (ref 0–37)
Albumin: 3.2 g/dL — ABNORMAL LOW (ref 3.5–5.2)
Alkaline Phosphatase: 69 U/L (ref 39–117)
Anion gap: 11 (ref 5–15)
BUN: 9 mg/dL (ref 6–23)
CALCIUM: 9.1 mg/dL (ref 8.4–10.5)
CO2: 27 meq/L (ref 19–32)
CREATININE: 0.62 mg/dL (ref 0.50–1.10)
Chloride: 101 mEq/L (ref 96–112)
GFR calc non Af Amer: >90 mL/min (ref >90)
GLUCOSE: 201 mg/dL — AB (ref 70–99)
Potassium: 4 mEq/L (ref 3.7–5.3)
SODIUM: 139 meq/L (ref 137–147)
TOTAL PROTEIN: 7.1 g/dL (ref 6.0–8.3)
Total Bilirubin: 0.7 mg/dL (ref 0.3–1.2)

## 2014-08-05 LAB — CBC
HCT: 36.4 % (ref 36.0–46.0)
Hemoglobin: 12.3 g/dL (ref 12.0–15.0)
MCH: 27.7 pg (ref 26.0–34.0)
MCHC: 33.8 g/dL (ref 30.0–36.0)
MCV: 82 fL (ref 78.0–100.0)
Platelets: 298 10*3/uL (ref 150–400)
RBC: 4.44 MIL/uL (ref 3.87–5.11)
RDW: 12.8 % (ref 11.5–15.5)
WBC: 15.5 10*3/uL — AB (ref 4.0–10.5)

## 2014-08-05 LAB — LIPID PANEL
CHOL/HDL RATIO: 3.9 ratio
Cholesterol: 202 mg/dL — ABNORMAL HIGH (ref 0–200)
HDL: 52 mg/dL (ref >39)
LDL Cholesterol: 129 mg/dL — ABNORMAL HIGH (ref 0–99)
TRIGLYCERIDES: 105 mg/dL (ref <150)
VLDL: 21 mg/dL (ref 0–40)

## 2014-08-05 LAB — GLUCOSE, CAPILLARY
GLUCOSE-CAPILLARY: 177 mg/dL — AB (ref 70–99)
Glucose-Capillary: 254 mg/dL — ABNORMAL HIGH (ref 70–99)
Glucose-Capillary: 158 mg/dL — ABNORMAL HIGH (ref 70–99)

## 2014-08-05 LAB — HEMOGLOBIN A1C
Hgb A1c MFr Bld: 13.4 % — ABNORMAL HIGH (ref <5.7)
Mean Plasma Glucose: 338 mg/dL — ABNORMAL HIGH (ref <117)

## 2014-08-05 LAB — TROPONIN I

## 2014-08-05 MED ORDER — INSULIN ASPART 100 UNIT/ML ~~LOC~~ SOLN
6.0000 [IU] | Freq: Three times a day (TID) | SUBCUTANEOUS | Status: DC
  Administered 2014-08-05 – 2014-08-07 (×3): 6 [IU] via SUBCUTANEOUS

## 2014-08-05 MED ORDER — LORAZEPAM 2 MG/ML IJ SOLN: 1.0000 mg | Freq: Three times a day (TID) | INTRAMUSCULAR | Status: DC

## 2014-08-05 MED ORDER — INSULIN ASPART 100 UNIT/ML ~~LOC~~ SOLN: 0.0000 [IU] | Freq: Every day | SUBCUTANEOUS | Status: DC

## 2014-08-05 MED ORDER — BISACODYL 10 MG RE SUPP
10.0000 mg | Freq: Once | RECTAL | Status: AC
  Administered 2014-08-05: 10 mg via RECTAL
  Filled 2014-08-05: qty 1

## 2014-08-05 MED ORDER — INSULIN GLARGINE 100 UNIT/ML ~~LOC~~ SOLN
35.0000 [IU] | Freq: Every day | SUBCUTANEOUS | Status: DC
  Administered 2014-08-05 – 2014-08-07 (×3): 35 [IU] via SUBCUTANEOUS
  Filled 2014-08-05 (×4): qty 0.35

## 2014-08-05 MED ORDER — PANTOPRAZOLE SODIUM 40 MG IV SOLR
40.0000 mg | Freq: Two times a day (BID) | INTRAVENOUS | Status: DC
  Administered 2014-08-05 – 2014-08-08 (×6): 40 mg via INTRAVENOUS
  Filled 2014-08-05 (×7): qty 40

## 2014-08-05 MED ORDER — LORAZEPAM 2 MG/ML IJ SOLN
1.0000 mg | Freq: Three times a day (TID) | INTRAMUSCULAR | Status: DC | PRN
  Administered 2014-08-06 – 2014-08-07 (×3): 1 mg via INTRAVENOUS
  Filled 2014-08-05 (×3): qty 1

## 2014-08-05 MED ORDER — INSULIN ASPART 100 UNIT/ML ~~LOC~~ SOLN
0.0000 [IU] | Freq: Three times a day (TID) | SUBCUTANEOUS | Status: DC
  Administered 2014-08-05: 11 [IU] via SUBCUTANEOUS
  Administered 2014-08-05: 6 [IU] via SUBCUTANEOUS
  Administered 2014-08-06 – 2014-08-07 (×4): 3 [IU] via SUBCUTANEOUS
  Administered 2014-08-08: 4 [IU] via SUBCUTANEOUS
  Administered 2014-08-08: 3 [IU] via SUBCUTANEOUS

## 2014-08-05 NOTE — Care Management Note (Signed)
    Page 1 of 1   08/05/2014     2:33:45 PM CARE MANAGEMENT NOTE 08/05/2014  Patient:  Nancy [name], Nancy [name]   Account Number:  [id number]  Date Initiated:  08/05/2014  Documentation initiated by:  Dessa Phi  Subjective/Objective Assessment:   53 Y/O F ADMITTED W/N/V.DKA.HX:DM,HTN.     Action/Plan:   FROM HOME.   Anticipated DC Date:  08/09/2014   Anticipated DC Plan:  [place]  CM consult      Choice offered to / List presented to:             Status of service:  In process, will continue to follow Medicare Important Message given?   (If response is "NO", the following Medicare IM given date fields will be blank) Date Medicare IM given:   Medicare IM given by:   Date Additional Medicare IM given:   Additional Medicare IM given by:    Discharge Disposition:    Per UR Regulation:  Reviewed for med. necessity/level of care/duration of stay  If discussed at Long Length of Stay Meetings, dates discussed:    Comments:  08/05/14 Spyros Winch RN,BSN NCM [phone] NO ANTICIPATED D/C NEEDS.

## 2014-08-05 NOTE — Progress Notes (Signed)
Spoke with patient about her diabetes.  Was diagnosed in 2006.  Sees Dr. Bubba Camp, endocrinologist.  Last saw her about 1 1/2 years ago.  Insurance does not cover all of the visit with her, so it has been financially difficult. Now takes Reli-0n 75/25 insulin 35 units BID in the vial due to cost.   Gives injections in abdomen and sometimes the thighs. Did attend DM classes when first diagnosed.  Not interested in talking with dietician at this time.  HgbA1C is 13.4% which is higher than it has been.  Will continue to follow while in hospital. Harvel Ricks RN BSN CDE

## 2014-08-05 NOTE — Progress Notes (Signed)
Progress Note   Nancy [name] [id number] DOB: 06/08/1961 DOA: 08/04/2014 PCP: Jacelyn Pi, MD   Brief Narrative:   Nancy [name] is an 53 y.o. female with a PMH of DM, HTN, hyperlipidemia who was admitted 08/04/14 with nausea, vomiting and chest pain.  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  Lipase WNL, exam benign.  Treated symptomatically with IVF, anti-emetics, reglan (may have a component of gastroparesis).  Vigorously hydrated with 2 L NS, and maintenance IVF at 125 cc/hr. Currently 25 units of Lantus each bedtime and moderate scale SSI with meal coverage.  CBG is 177-322. Change to insulin resistant scale and increase Lantus to 35 units.  DM coordinator consultation requested. Hemoglobin A1c 13.4%. Emesis is Gastroccult positive. Hemoglobin dropped 3 g/24 hours but this is felt to be mostly from dilutional factors. If hemoglobin drops further, will need a GI consultation. In the meantime, increase PPI therapy 2 every 12 hours.  Active Problems:  Hyperlipidemia  Target LDL is less than 100 given her diabetes and hypertension.  We'll start on statin therapy when GI issues resolved.    Chest pain  Atypical, likely from episodes of nausea/vomiting.  Troponins negative x 3. We'll discontinue daily aspirin given GI issues.  Furuncle of axilla  D/C Clindamycin. Suspect hiadenitis, which are sterile abscesses.  Treat yeast superinfection of left axilla with IV diflucan.  Elevated serum protein level  Likely secondary to dehydration. Resolved after being hydrated.  DVT prophylaxis  Lovenox ordered.  Code Status: Full.  Family Communication: [name] (husband) [phone] is emergency contact.  Disposition Plan: Home when  stable.   IV Access:    Peripheral IV   Procedures and diagnostic studies:    Dg Chest 2 View 08/04/14: Subsegmental atelectasis both lung bases. Exam otherwise unremarkable.  DG Abd 2 Views 08/05/14: Diffuse stool throughout the colon. Overall bowel gas pattern unremarkable.   Medical Consultants:    None.   Other Consultants:    Diabetes coordinator   Anti-Infectives:    Diflucan 08/04/14--->  Subjective:   Nancy [name] continues to have nausea/dry heaves.  No diarrhea.  Complains of epigastric pain as well.  Objective:    Filed Vitals:   08/04/14 1342 08/04/14 1408 08/04/14 2032 08/05/14 0609  BP: 149/92  146/99 145/84  Pulse: 98  94 87  Temp: 98.2 F (36.8 C)  98.3 F (36.8 C) 98 F (36.7 C)  TempSrc: Oral  Oral Oral  Resp: 18  20 18   Height:  5\' 3"  (1.6 m)    Weight:  88.905 kg (196 lb)    SpO2: 98%  100% 100%    Intake/Output Summary (Last 24 hours) at 08/05/14 0835 Last data filed at 08/05/14 [id number]  Gross per 24 hour  Intake 2622.08 ml  Output      0 ml  Net 2622.08 ml    Exam: Gen:  Sitting up with emesis bag in hands, nauseated Cardiovascular:  RRR, No M/R/G Respiratory:  Lungs CTAB Gastrointestinal:  Abdomen soft, tender midepigastrium, + BS Extremities:  No C/E/C   Data Reviewed:    Labs: Basic Metabolic Panel:  Recent Labs Lab 08/04/14 1104 08/04/14 1800 08/05/14 0203  NA 132* 140 139  K 5.3 3.7 4.0  CL 95* 101 101  CO2 17*  26 27  GLUCOSE 376* 226* 201*  BUN 10 10 9   CREATININE 0.43* 0.58 0.62  CALCIUM 10.2 9.7 9.1   GFR Estimated Creatinine Clearance: 85 ml/min (by C-G formula based on Cr of 0.62). Liver Function Tests:  Recent Labs Lab 08/04/14 1104 08/05/14 0203  AST 30 9  ALT 13 9  ALKPHOS 86 69  BILITOT 0.7 0.7  PROT 8.9* 7.1  ALBUMIN 3.9 3.2*    Recent Labs Lab 08/04/14 1104  LIPASE 15   CBC:  Recent Labs Lab 08/04/14 1104 08/05/14 0203  WBC 12.5* 15.5*  NEUTROABS 9.9*  --     HGB 15.3* 12.3  HCT 43.7 36.4  MCV 80.5 82.0  PLT 301 298   Cardiac Enzymes:  Recent Labs Lab 08/04/14 1435 08/04/14 1914 08/05/14 0203  TROPONINI <0.30 <0.30 <0.30   CBG:  Recent Labs Lab 08/04/14 1209 08/04/14 1216 08/04/14 1652 08/04/14 2201 08/05/14 0746  GLUCAP 322* 311* 204* 235* 177*   Hgb A1c:  Recent Labs  08/04/14 1510  HGBA1C 13.4*   Lipid Profile:  Recent Labs  08/05/14 0203  CHOL 202*  HDL 52  LDLCALC 129*  TRIG 105  CHOLHDL 3.9   Sepsis Labs:  Recent Labs Lab 08/04/14 1104 08/05/14 0203  WBC 12.5* 15.5*   Microbiology No results found for this or any previous visit (from the past 240 hour(s)).   Medications:   . aspirin  324 mg Oral Daily  . enoxaparin (LOVENOX) injection  40 mg Subcutaneous Q24H  . fluconazole (DIFLUCAN) IV  200 mg Intravenous Q24H  . insulin aspart  0-15 Units Subcutaneous TID WC  . insulin aspart  0-5 Units Subcutaneous QHS  . insulin aspart  4 Units Subcutaneous TID WC  . insulin glargine  25 Units Subcutaneous QHS  . lisinopril  5 mg Oral Daily  . metoCLOPramide (REGLAN) injection  5 mg Intravenous 4 times per day  . nystatin  1 g Topical BID  . pantoprazole (PROTONIX) IV  40 mg Intravenous Daily  . sodium chloride  3 mL Intravenous Q12H   Continuous Infusions: . sodium chloride 125 mL/hr at 08/05/14 0729    Time spent: 35 minutes with > 50% of time discussing current diagnostic test results, clinical impression and plan of care.    LOS: 1 day   Elias Dennington  [facility] Pager [phone]. If unable to reach me by pager, please call my cell phone at [phone].  *Please refer to [email or web address], password [email or web address] to get updated schedule on who will round on this patient, as hospitalists switch teams weekly. If 7PM-7AM, please contact night-coverage at [email or web address], password [email or web address] for any overnight needs.  08/05/2014, 8:35 AM

## 2014-08-06 DIAGNOSIS — E876 Hypokalemia: Secondary | ICD-10-CM | POA: Diagnosis present

## 2014-08-06 LAB — COMPREHENSIVE METABOLIC PANEL
ALBUMIN: 3.3 g/dL — AB (ref 3.5–5.2)
ALK PHOS: 68 U/L (ref 39–117)
ALT: 9 U/L (ref 0–35)
AST: 12 U/L (ref 0–37)
Anion gap: 15 (ref 5–15)
BUN: 7 mg/dL (ref 6–23)
CO2: 24 mEq/L (ref 19–32)
Calcium: 9.1 mg/dL (ref 8.4–10.5)
Chloride: 98 mEq/L (ref 96–112)
Creatinine, Ser: 0.54 mg/dL (ref 0.50–1.10)
GFR calc Af Amer: >90 mL/min (ref >90)
GFR calc non Af Amer: >90 mL/min (ref >90)
Glucose, Bld: 145 mg/dL — ABNORMAL HIGH (ref 70–99)
POTASSIUM: 3.2 meq/L — AB (ref 3.7–5.3)
Sodium: 137 mEq/L (ref 137–147)
Total Bilirubin: 0.6 mg/dL (ref 0.3–1.2)
Total Protein: 7.5 g/dL (ref 6.0–8.3)

## 2014-08-06 LAB — CBC
HEMATOCRIT: 39.5 % (ref 36.0–46.0)
Hemoglobin: 13.4 g/dL (ref 12.0–15.0)
MCH: 27.7 pg (ref 26.0–34.0)
MCHC: 33.9 g/dL (ref 30.0–36.0)
MCV: 81.8 fL (ref 78.0–100.0)
PLATELETS: 276 10*3/uL (ref 150–400)
RBC: 4.83 MIL/uL (ref 3.87–5.11)
RDW: 12.5 % (ref 11.5–15.5)
WBC: 13.5 10*3/uL — ABNORMAL HIGH (ref 4.0–10.5)

## 2014-08-06 LAB — GLUCOSE, CAPILLARY
Glucose-Capillary: 145 mg/dL — ABNORMAL HIGH (ref 70–99)
Glucose-Capillary: 124 mg/dL — ABNORMAL HIGH (ref 70–99)

## 2014-08-06 MED ORDER — POTASSIUM CHLORIDE IN NACL 40-0.9 MEQ/L-% IV SOLN
INTRAVENOUS | Status: DC
  Administered 2014-08-06 – 2014-08-07 (×3): 100 mL/h via INTRAVENOUS
  Administered 2014-08-07 (×2): 10 mL/h via INTRAVENOUS
  Filled 2014-08-06 (×4): qty 1000

## 2014-08-06 MED ORDER — FLEET ENEMA 7-19 GM/118ML RE ENEM
1.0000 | ENEMA | Freq: Once | RECTAL | Status: AC
  Administered 2014-08-06: 1 via RECTAL
  Filled 2014-08-06: qty 1

## 2014-08-06 MED ORDER — HYDRALAZINE HCL 20 MG/ML IJ SOLN
5.0000 mg | INTRAMUSCULAR | Status: DC | PRN
  Administered 2014-08-06 – 2014-08-08 (×3): 10 mg via INTRAVENOUS
  Filled 2014-08-06 (×3): qty 1

## 2014-08-06 MED ORDER — POLYETHYLENE GLYCOL 3350 17 G PO PACK
17.0000 g | PACK | Freq: Every day | ORAL | Status: DC
  Administered 2014-08-06 – 2014-08-08 (×3): 17 g via ORAL
  Filled 2014-08-06 (×3): qty 1

## 2014-08-06 NOTE — Progress Notes (Signed)
CARE MANAGEMENT NOTE 08/06/2014  Patient:  Nancy [name], Nancy [name]   Account Number:  [id number]  Date Initiated:  08/05/2014  Documentation initiated by:  Dessa Phi  Subjective/Objective Assessment:   53 Y/O F ADMITTED W/N/V.DKA.HX:DM,HTN.     Action/Plan:   FROM HOME.   Anticipated DC Date:  08/09/2014   Anticipated DC Plan:  [place]  CM consult      Choice offered to / List presented to:             Status of service:  In process, will continue to follow Medicare Important Message given?   (If response is "NO", the following Medicare IM given date fields will be blank) Date Medicare IM given:   Medicare IM given by:   Date Additional Medicare IM given:   Additional Medicare IM given by:    Discharge Disposition:    Per UR Regulation:  Reviewed for med. necessity/level of care/duration of stay  If discussed at [place] of Stay Meetings, dates discussed:    Comments:  08/06/14 Loy Mccartt RN,BSN NCM [place] LANTUS-$244/MONTH;PEN$366.83/MONTH.WILL PROVIDE DISCOUNT CARD FOR PATIENT.  08/05/14 Alexandre Lightsey RN,BSN NCM [phone] NO ANTICIPATED D/C NEEDS.

## 2014-08-06 NOTE — Progress Notes (Addendum)
Progress Note   [name] [id number] DOB: Oct 14, 1961 DOA: 08/04/2014 PCP: Jacelyn Pi, MD   Brief Narrative:   Nancy [name] is an 53 y.o. female with a PMH of DM, HTN, hyperlipidemia who was admitted 08/04/14 with nausea, vomiting and chest pain.  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  Lipase WNL, exam benign.  Treated symptomatically with IVF, anti-emetics, reglan (may have a component of gastroparesis).  Vigorously hydrated with 2 L NS, and maintenance IVF at 125 cc/hr. Currently 35 units of Lantus each bedtime and resistant scale SSI with meal coverage.  CBG is 145-254.  DM coordinator following. Hemoglobin A1c 13.4%. Emesis is Gastroccult positive. Hemoglobin dropped 3 g/24 hours but this is felt to be mostly from dilutional factors. Hemoglobin stable over the past 24 hours. In the meantime, continue PPI therapy.  Active Problems:  Constipation  No BM with dulcolax, will give Fleet enema today.  Hypokalemia  Add potassium to IV fluids.  Hyperlipidemia  Target LDL is less than 100 given her diabetes and hypertension.  We'll start on statin therapy when GI issues resolved.    Chest pain  Atypical, likely from episodes of nausea/vomiting.  Troponins negative x 3. We'll discontinue daily aspirin given GI issues.  Furuncle of axilla  D/C Clindamycin. Suspect hiadenitis, which are sterile abscesses.  Treat yeast superinfection of left axilla with IV diflucan.  Elevated serum protein level  Likely secondary to dehydration. Resolved after being hydrated.  DVT prophylaxis  Use SCDs given recent hematemesis.  Code Status: Full.  Family Communication: [name] (husband) [phone] is emergency contact.   Disposition Plan: Home when stable.   IV Access:    Peripheral IV   Procedures and diagnostic studies:    Dg Chest 2 View 08/04/14: Subsegmental atelectasis both lung bases. Exam otherwise unremarkable.  DG Abd 2 Views 08/05/14: Diffuse stool throughout the colon. Overall bowel gas pattern unremarkable.   Medical Consultants:    None.   Other Consultants:    Diabetes coordinator   Anti-Infectives:    Diflucan 08/04/14--->  Subjective:   [name] continues to have nausea/dry heaves and abdominal pain.  No BM.    Objective:    Filed Vitals:   08/05/14 2052 08/06/14 0545 08/06/14 0549 08/06/14 0700  BP: 163/97 194/112 185/98 151/87  Pulse:  101    Temp: 98.1 F (36.7 C) 98.2 F (36.8 C)    TempSrc: Oral Oral    Resp: 18 20    Height:      Weight:      SpO2: 100% 100%      Intake/Output Summary (Last 24 hours) at 08/06/14 0830 Last data filed at 08/06/14 0600  Gross per 24 hour  Intake 2985.42 ml  Output    600 ml  Net 2385.42 ml    Exam: Gen:  NAD, nauseated Cardiovascular:  RRR, No M/R/G Respiratory:  Lungs CTAB Gastrointestinal:  Abdomen soft, tender midepigastrium, + BS Extremities:  No C/E/C   Data Reviewed:    Labs: Basic Metabolic Panel:  Recent Labs Lab 08/04/14 1104 08/04/14 1800 08/05/14 0203 08/06/14 0418  NA 132* 140 139 137  K 5.3 3.7 4.0 3.2*  CL 95* 101 101 98  CO2 17* 26 27 24   GLUCOSE 376* 226* 201* 145*  BUN 10 10 9 7   CREATININE 0.43* 0.58 0.62 0.54  CALCIUM 10.2 9.7 9.1 9.1   GFR Estimated Creatinine Clearance: 85 ml/min (by C-G formula based on Cr of 0.54). Liver Function Tests:  Recent Labs Lab 08/04/14 1104 08/05/14 0203 08/06/14 0418  AST 30 9 12   ALT 13 9 9   ALKPHOS 86 69 68  BILITOT 0.7 0.7 0.6  PROT 8.9* 7.1 7.5  ALBUMIN 3.9 3.2* 3.3*    Recent Labs Lab 08/04/14 1104  LIPASE 15   CBC:  Recent Labs Lab 08/04/14 1104 08/05/14 0203 08/06/14 0418  WBC 12.5* 15.5* 13.5*   NEUTROABS 9.9*  --   --   HGB 15.3* 12.3 13.4  HCT 43.7 36.4 39.5  MCV 80.5 82.0 81.8  PLT 301 298 276   Cardiac Enzymes:  Recent Labs Lab 08/04/14 1435 08/04/14 1914 08/05/14 0203  TROPONINI <0.30 <0.30 <0.30   CBG:  Recent Labs Lab 08/04/14 2201 08/05/14 0746 08/05/14 1146 08/05/14 2055 08/06/14 0722  GLUCAP 235* 177* 254* 158* 145*   Hgb A1c:  Recent Labs  08/04/14 1510  HGBA1C 13.4*   Lipid Profile:  Recent Labs  08/05/14 0203  CHOL 202*  HDL 52  LDLCALC 129*  TRIG 105  CHOLHDL 3.9   Sepsis Labs:  Recent Labs Lab 08/04/14 1104 08/05/14 0203 08/06/14 0418  WBC 12.5* 15.5* 13.5*   Microbiology No results found for this or any previous visit (from the past 240 hour(s)).   Medications:   . enoxaparin (LOVENOX) injection  40 mg Subcutaneous Q24H  . fluconazole (DIFLUCAN) IV  200 mg Intravenous Q24H  . insulin aspart  0-20 Units Subcutaneous TID WC  . insulin aspart  0-5 Units Subcutaneous QHS  . insulin aspart  6 Units Subcutaneous TID WC  . insulin glargine  35 Units Subcutaneous QHS  . lisinopril  5 mg Oral Daily  . metoCLOPramide (REGLAN) injection  5 mg Intravenous 4 times per day  . nystatin  1 g Topical BID  . pantoprazole (PROTONIX) IV  40 mg Intravenous Q12H  . sodium chloride  3 mL Intravenous Q12H   Continuous Infusions: . sodium chloride 125 mL/hr at 08/06/14 0108    Time spent: 25 minutes.    LOS: 2 days   Karrie Fluellen  [facility] Pager [phone]. If unable to reach me by pager, please call my cell phone at [phone].  *Please refer to [email or web address], password [email or web address] to get updated schedule on who will round on this patient, as hospitalists switch teams weekly. If 7PM-7AM, please contact night-coverage at [email or web address], password [email or web address] for any overnight needs.  08/06/2014, 8:30 AM

## 2014-08-07 DIAGNOSIS — I10 Essential (primary) hypertension: Secondary | ICD-10-CM

## 2014-08-07 LAB — GLUCOSE, CAPILLARY
GLUCOSE-CAPILLARY: 135 mg/dL — AB (ref 70–99)
GLUCOSE-CAPILLARY: 125 mg/dL — AB (ref 70–99)
GLUCOSE-CAPILLARY: 91 mg/dL (ref 70–99)
Glucose-Capillary: 211 mg/dL — ABNORMAL HIGH (ref 70–99)
Glucose-Capillary: 136 mg/dL — ABNORMAL HIGH (ref 70–99)
Glucose-Capillary: 116 mg/dL — ABNORMAL HIGH (ref 70–99)
Glucose-Capillary: 171 mg/dL — ABNORMAL HIGH (ref 70–99)

## 2014-08-07 LAB — CBC
HEMATOCRIT: 37.9 % (ref 36.0–46.0)
Hemoglobin: 12.9 g/dL (ref 12.0–15.0)
MCH: 27.9 pg (ref 26.0–34.0)
MCHC: 34 g/dL (ref 30.0–36.0)
MCV: 82 fL (ref 78.0–100.0)
PLATELETS: 299 10*3/uL (ref 150–400)
RBC: 4.62 MIL/uL (ref 3.87–5.11)
RDW: 12.7 % (ref 11.5–15.5)
WBC: 14.1 10*3/uL — AB (ref 4.0–10.5)

## 2014-08-07 LAB — BASIC METABOLIC PANEL
ANION GAP: 11 (ref 5–15)
BUN: 10 mg/dL (ref 6–23)
CO2: 28 meq/L (ref 19–32)
Calcium: 8.9 mg/dL (ref 8.4–10.5)
Chloride: 102 mEq/L (ref 96–112)
Creatinine, Ser: 0.71 mg/dL (ref 0.50–1.10)
GFR calc Af Amer: >90 mL/min (ref >90)
GLUCOSE: 108 mg/dL — AB (ref 70–99)
Potassium: 3.4 mEq/L — ABNORMAL LOW (ref 3.7–5.3)
SODIUM: 141 meq/L (ref 137–147)

## 2014-08-07 MED ORDER — METOPROLOL TARTRATE 1 MG/ML IV SOLN
5.0000 mg | Freq: Once | INTRAVENOUS | Status: AC
  Administered 2014-08-07: 5 mg via INTRAVENOUS
  Filled 2014-08-07: qty 5

## 2014-08-07 MED ORDER — POTASSIUM CHLORIDE 10 MEQ/100ML IV SOLN
10.0000 meq | INTRAVENOUS | Status: AC
  Administered 2014-08-07 (×4): 10 meq via INTRAVENOUS
  Filled 2014-08-07 (×4): qty 100

## 2014-08-07 MED ORDER — METOCLOPRAMIDE HCL 5 MG/ML IJ SOLN
10.0000 mg | Freq: Four times a day (QID) | INTRAMUSCULAR | Status: DC
  Administered 2014-08-07 – 2014-08-08 (×4): 10 mg via INTRAVENOUS
  Filled 2014-08-07 (×4): qty 2

## 2014-08-07 MED ORDER — AMLODIPINE BESYLATE 10 MG PO TABS
10.0000 mg | ORAL_TABLET | Freq: Every day | ORAL | Status: DC
  Administered 2014-08-07 – 2014-08-08 (×2): 10 mg via ORAL
  Filled 2014-08-07 (×2): qty 1

## 2014-08-07 NOTE — ED Provider Notes (Signed)
Medical screening examination/treatment/procedure(s) were performed by non-physician practitioner and as supervising physician I was immediately available for consultation/collaboration.   EKG Interpretation   Date/Time:  Wednesday August 04 2014 10:42:48 EDT Ventricular Rate:  98 PR Interval:  143 QRS Duration: 82 QT Interval:  350 QTC Calculation: 447 R Axis:   -55 Text Interpretation:  Sinus rhythm Inferior infarct, old Consider anterior  infarct Sinus rhythm Artifact T wave abnormality Abnormal ekg Confirmed by  Carmin Muskrat  MD ([id number]) on 08/04/2014 10:47:36 AM        Francine Graven, DO 08/07/14 1451

## 2014-08-07 NOTE — Progress Notes (Signed)
Progress Note   Nancy [name] [id number] DOB: 1961-02-07 DOA: 08/04/2014 PCP: Jacelyn Pi, MD   Brief Narrative:   Nancy [name] is an 53 y.o. female with a PMH of DM, HTN, hyperlipidemia who was admitted 08/04/14 with nausea, vomiting and chest pain.  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  Lipase WNL, exam benign.  Treated symptomatically with IVF, anti-emetics, reglan (may have a component of gastroparesis).  Vigorously hydrated with 2 L NS, and maintenance IVF at 125 cc/hr. Currently 35 units of Lantus each bedtime and resistant scale SSI with meal coverage.  CBG is 91-145.  DM coordinator following. Hemoglobin A1c 13.4%. Emesis is Gastroccult positive. Hemoglobin dropped 3 g/24 hours but this is felt to be mostly from dilutional factors. Hemoglobin stable over the past 24 hours. In the meantime, continue PPI therapy.  Active Problems: Accelerated hypertension  Marked elevation of blood pressure noted. On lisinopril. Add Norvasc.  Continue hydralazine when necessary.   Constipation  Bowels moved after enema yesterday.  Continue MiraLAX.  Hypokalemia  We'll give for runs of IV potassium today.  Hyperlipidemia  Target LDL is less than 100 given her diabetes and hypertension.  We'll start on statin therapy when GI issues resolved.    Chest pain  Atypical, likely from episodes of nausea/vomiting.  Troponins negative x 3. Aspirin discontinued secondary hematemesis/GI issues.  Furuncle of axilla  D/C Clindamycin. Suspect hiadenitis, which are sterile abscesses.  Treat yeast superinfection of left axilla with IV diflucan.  Elevated serum protein level  Likely secondary to dehydration. Resolved after being  hydrated.  DVT prophylaxis  Use SCDs given recent hematemesis.  Code Status: Full.  Family Communication: [name] (husband) [phone] is emergency contact.  Disposition Plan: Home when stable.   IV Access:    Peripheral IV   Procedures and diagnostic studies:    Dg Chest 2 View 08/04/14: Subsegmental atelectasis both lung bases. Exam otherwise unremarkable.  DG Abd 2 Views 08/05/14: Diffuse stool throughout the colon. Overall bowel gas pattern unremarkable.   Medical Consultants:    None.   Other Consultants:    Diabetes coordinator   Anti-Infectives:    Diflucan 08/04/14--->  Subjective:   Nancy [name] felt better earlier this morning but has subsequently developed nausea/dry heaves and anxiety.  Bowels moved yesterday after Fleet enema.   Objective:    Filed Vitals:   08/06/14 1900 08/06/14 2152 08/06/14 2228 08/07/14 0500  BP: 151/100 213/109 160/99 137/82  Pulse: 110 99 100 85  Temp: 99.8 F (37.7 C) 98.8 F (37.1 C)  97.9 F (36.6 C)  TempSrc: Oral Oral  Oral  Resp: 20 20  20   Height:      Weight:      SpO2: 100% 99%  100%    Intake/Output Summary (Last 24 hours) at 08/07/14 0848 Last data filed at 08/07/14 0629  Gross per 24 hour  Intake 2068.33 ml  Output      0 ml  Net 2068.33 ml    Exam: Gen:  NAD Cardiovascular:  RRR, No M/R/G Respiratory:  Lungs CTAB Gastrointestinal:  Abdomen soft, tender midepigastrium, + BS Extremities:  No C/E/C   Data Reviewed:    Labs: Basic Metabolic Panel:  Recent Labs Lab 08/04/14 1104 08/04/14 1800 08/05/14 0203 08/06/14  0418 08/07/14 0424  NA 132* 140 139 137 141  K 5.3 3.7 4.0 3.2* 3.4*  CL 95* 101 101 98 102  CO2 17* 26 27 24 28   GLUCOSE 376* 226* 201* 145* 108*  BUN 10 10 9 7 10   CREATININE 0.43* 0.58 0.62 0.54 0.71  CALCIUM 10.2 9.7 9.1 9.1 8.9   GFR Estimated Creatinine Clearance: 85 ml/min (by C-G formula based on Cr of 0.71). Liver Function Tests:  Recent Labs Lab  08/04/14 1104 08/05/14 0203 08/06/14 0418  AST 30 9 12   ALT 13 9 9   ALKPHOS 86 69 68  BILITOT 0.7 0.7 0.6  PROT 8.9* 7.1 7.5  ALBUMIN 3.9 3.2* 3.3*    Recent Labs Lab 08/04/14 1104  LIPASE 15   CBC:  Recent Labs Lab 08/04/14 1104 08/05/14 0203 08/06/14 0418 08/07/14 0424  WBC 12.5* 15.5* 13.5* 14.1*  NEUTROABS 9.9*  --   --   --   HGB 15.3* 12.3 13.4 12.9  HCT 43.7 36.4 39.5 37.9  MCV 80.5 82.0 81.8 82.0  PLT 301 298 276 299   Cardiac Enzymes:  Recent Labs Lab 08/04/14 1435 08/04/14 1914 08/05/14 0203  TROPONINI <0.30 <0.30 <0.30   CBG:  Recent Labs Lab 08/06/14 0722 08/06/14 1156 08/06/14 1646 08/06/14 2155 08/07/14 0738  GLUCAP 145* 124* 135* 125* 91   Hgb A1c:  Recent Labs  08/04/14 1510  HGBA1C 13.4*   Lipid Profile:  Recent Labs  08/05/14 0203  CHOL 202*  HDL 52  LDLCALC 129*  TRIG 105  CHOLHDL 3.9   Microbiology No results found for this or any previous visit (from the past 240 hour(s)).   Medications:   . fluconazole (DIFLUCAN) IV  200 mg Intravenous Q24H  . insulin aspart  0-20 Units Subcutaneous TID WC  . insulin aspart  0-5 Units Subcutaneous QHS  . insulin aspart  6 Units Subcutaneous TID WC  . insulin glargine  35 Units Subcutaneous QHS  . lisinopril  5 mg Oral Daily  . metoCLOPramide (REGLAN) injection  5 mg Intravenous 4 times per day  . nystatin  1 g Topical BID  . pantoprazole (PROTONIX) IV  40 mg Intravenous Q12H  . polyethylene glycol  17 g Oral Daily  . sodium chloride  3 mL Intravenous Q12H   Continuous Infusions: . 0.9 % NaCl with KCl 40 mEq / L 100 mL/hr (08/07/14 0543)    Time spent: 25 minutes.    LOS: 3 days   [facility] Pager [phone]. If unable to reach me by pager, please call my cell phone at [phone].  *Please refer to [email or web address], password [email or web address] to get updated schedule on who will round on this patient, as hospitalists switch teams weekly. If 7PM-7AM, please  contact night-coverage at [email or web address], password [email or web address] for any overnight needs.  08/07/2014, 8:48 AM

## 2014-08-08 LAB — GLUCOSE, CAPILLARY
Glucose-Capillary: 166 mg/dL — ABNORMAL HIGH (ref 70–99)
Glucose-Capillary: 142 mg/dL — ABNORMAL HIGH (ref 70–99)

## 2014-08-08 LAB — BASIC METABOLIC PANEL
ANION GAP: 15 (ref 5–15)
BUN: 8 mg/dL (ref 6–23)
CALCIUM: 9.5 mg/dL (ref 8.4–10.5)
CO2: 25 meq/L (ref 19–32)
Chloride: 96 mEq/L (ref 96–112)
Creatinine, Ser: 0.52 mg/dL (ref 0.50–1.10)
GFR calc non Af Amer: >90 mL/min (ref >90)
Glucose, Bld: 165 mg/dL — ABNORMAL HIGH (ref 70–99)
POTASSIUM: 3.5 meq/L — AB (ref 3.7–5.3)
SODIUM: 136 meq/L — AB (ref 137–147)

## 2014-08-08 MED ORDER — INSULIN LISPRO PROT & LISPRO (75-25 MIX) 100 UNIT/ML KWIKPEN: 30.0000 [IU] | PEN_INJECTOR | Freq: Two times a day (BID) | SUBCUTANEOUS | Status: DC

## 2014-08-08 MED ORDER — LISINOPRIL-HYDROCHLOROTHIAZIDE 20-25 MG PO TABS: 1.0000 | ORAL_TABLET | Freq: Every day | ORAL | Status: DC

## 2014-08-08 MED ORDER — HYDRALAZINE HCL 25 MG PO TABS: 25.0000 mg | ORAL_TABLET | Freq: Three times a day (TID) | ORAL | Status: DC

## 2014-08-08 MED ORDER — HYDRALAZINE HCL 25 MG PO TABS
25.0000 mg | ORAL_TABLET | Freq: Three times a day (TID) | ORAL | Status: DC
  Administered 2014-08-08: 25 mg via ORAL
  Filled 2014-08-08 (×4): qty 1

## 2014-08-08 MED ORDER — POTASSIUM CHLORIDE CRYS ER 20 MEQ PO TBCR
40.0000 meq | EXTENDED_RELEASE_TABLET | Freq: Once | ORAL | Status: AC
  Administered 2014-08-08: 40 meq via ORAL
  Filled 2014-08-08: qty 2

## 2014-08-08 MED ORDER — METOCLOPRAMIDE HCL 10 MG PO TABS: 10.0000 mg | ORAL_TABLET | Freq: Three times a day (TID) | ORAL | Status: DC

## 2014-08-08 MED ORDER — FAMOTIDINE 20 MG PO TABS: 20.0000 mg | ORAL_TABLET | Freq: Two times a day (BID) | ORAL | Status: DC

## 2014-08-08 NOTE — Progress Notes (Signed)
Last night, patient's BP was 200/100. After a new IV was put in, RN was able to give IV morphine and IV apresoline. BP and HR were rechecked 30 minutes later, and BP was 185/99 and HR 120. MD was notified and new orders were given for a one time dose of lopressor. 30 minutes after lopressor was given HR went to 100 and BP to 171/99. Will continue to monitor patient.

## 2014-08-08 NOTE — Progress Notes (Signed)
08/08/2014 1330 Contacted [facility] for [facility] RN. Jonnie Finner RN CCM Case Mgmt phone [phone]

## 2014-08-08 NOTE — Discharge Summary (Signed)
Physician Discharge Summary  Nancy [name] [id number] DOB: July 30, 1961 DOA: 08/04/2014  PCP: Jacelyn Pi, MD  Admit date: 08/04/2014 Discharge date: 08/08/2014   Recommendations for Outpatient Follow-Up:   1. Home health RN set up to evaluate home situation given uncontrolled diabetes and concerns for patient's ability to draw up and inject the correct dose of insulin. 2. Recommend close followup of glycemic and blood pressure control. Recommend initiation of statin therapy once her GI issues are completely resolved. 3. Recommend consideration of outpatient gastric emptying study once symptoms have improved. Note: Patient discharged with therapy with Reglan which would need to be discontinued prior to scheduling a gastric emptying study.   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  Diet recommendation: Low sodium, heart healthy.  Carbohydrate-modified.     History of Present Illness:   Nancy [name] is an 52 y.o. female with a PMH of DM, HTN, hyperlipidemia who was admitted 08/04/14 with nausea, vomiting and chest pain.  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   Lipase WNL, exam benign.   Treated symptomatically with IVF, anti-emetics, reglan (may have a component of gastroparesis).   Vigorously hydrated with 2 L NS,  and maintenance IVF at 125 cc/hr. Currently 35 units of Lantus each bedtime and resistant scale SSI with meal coverage. CBG is 91-145.   DM coordinator following. Hemoglobin A1c 13.4%.   Emesis is Gastroccult positive. Hemoglobin dropped 3 g/24 hours but this was felt to be mostly from dilutional factors. Hemoglobin stable over the past 72 hours. Treated with PPI therapy.  Active Problems:  Accelerated hypertension   Marked elevation of blood pressure noted. Treated with lisinopril and Norvasc.   Due to cost constraints, the patient was discharged on a higher dose of her home regimen of lisinopril with hydrochlorothiazide and hydralazine added.  Needs close followup of blood pressure control.  Constipation   Bowels moved after being given an enema 08/07/14.   Continue MiraLAX.  Hypokalemia   Repleted.  Hyperlipidemia   Target LDL is less than 100 given her diabetes and hypertension.   Chest pain   Atypical, likely from episodes of nausea/vomiting.   Troponins negative x 3. Aspirin discontinued secondary hematemesis/GI issues.  Furuncle of axilla   D/C Clindamycin. Suspect hiadenitis, which are sterile abscesses.   Status post treatment of yeast superinfection of left axilla with IV diflucan x5 days.  Elevated serum protein level   Likely secondary to dehydration. Resolved after being hydrated.   Medical Consultants:    None.   Discharge Exam:   Filed Vitals:   08/08/14 0851  BP: 117/72  Pulse:   Temp:   Resp:    Filed Vitals:   08/07/14 2234 08/08/14 0449 08/08/14 0608 08/08/14 0851  BP: 171/99 187/99 135/71 117/72  Pulse: 100 107    Temp:  98.5 F (  36.9 C)    TempSrc:  Oral    Resp:  20    Height:      Weight:      SpO2:  100%      Gen:  NAD Cardiovascular:  RRR, No M/R/G Respiratory: Lungs CTAB Gastrointestinal: Abdomen soft, NT/ND with normal active bowel sounds. Extremities: No C/E/C   The results of significant diagnostics from this  hospitalization (including imaging, microbiology, ancillary and laboratory) are listed below for reference.     Procedures and Diagnostic Studies:   Dg Chest 2 View 08/04/14: Subsegmental atelectasis both lung bases. Exam otherwise unremarkable.   DG Abd 2 Views 08/05/14: Diffuse stool throughout the colon. Overall bowel gas pattern unremarkable.    Labs:   Basic Metabolic Panel:  Recent Labs Lab 08/04/14 1800 08/05/14 0203 08/06/14 0418 08/07/14 0424 08/08/14 0414  NA 140 139 137 141 136*  K 3.7 4.0 3.2* 3.4* 3.5*  CL 101 101 98 102 96  CO2 26 27 24 28 25   GLUCOSE 226* 201* 145* 108* 165*  BUN 10 9 7 10 8   CREATININE 0.58 0.62 0.54 0.71 0.52  CALCIUM 9.7 9.1 9.1 8.9 9.5   GFR Estimated Creatinine Clearance: 85 ml/min (by C-G formula based on Cr of 0.52). Liver Function Tests:  Recent Labs Lab 08/04/14 1104 08/05/14 0203 08/06/14 0418  AST 30 9 12   ALT 13 9 9   ALKPHOS 86 69 68  BILITOT 0.7 0.7 0.6  PROT 8.9* 7.1 7.5  ALBUMIN 3.9 3.2* 3.3*    Recent Labs Lab 08/04/14 1104  LIPASE 15   CBC:  Recent Labs Lab 08/04/14 1104 08/05/14 0203 08/06/14 0418 08/07/14 0424  WBC 12.5* 15.5* 13.5* 14.1*  NEUTROABS 9.9*  --   --   --   HGB 15.3* 12.3 13.4 12.9  HCT 43.7 36.4 39.5 37.9  MCV 80.5 82.0 81.8 82.0  PLT 301 298 276 299   Cardiac Enzymes:  Recent Labs Lab 08/04/14 1435 08/04/14 1914 08/05/14 0203  TROPONINI <0.30 <0.30 <0.30   CBG:  Recent Labs Lab 08/07/14 1144 08/07/14 1659 08/07/14 2205 08/08/14 0811 08/08/14 1211  GLUCAP 136* 116* 171* 166* 142*     Discharge Instructions:   Discharge Instructions   Activity as tolerated - No restrictions    Complete by:  As directed      Call MD for:  persistant nausea and vomiting    Complete by:  As directed      Call MD for:  severe uncontrolled pain    Complete by:  As directed      Call MD for:    Complete by:  As directed   Persistently elevated blood glucoses greater than 250.       Diet - low sodium heart healthy    Complete by:  As directed      Diet Carb Modified    Complete by:  As directed      Discharge instructions    Complete by:  As directed   You were cared for by Dr. Jacquelynn Cree  (a hospitalist) during your hospital stay. If you have any questions about your discharge medications or the care you received while you were in the hospital after you are discharged, you can call the unit and ask to speak with the hospitalist on call if the hospitalist that took care of you is not available. Once you are discharged, your primary care physician will handle any further medical issues. Please note that NO REFILLS for  any discharge medications will be authorized once you are discharged, as it is imperative that you return to your primary care physician (or establish a relationship with a primary care physician if you do not have one) for your aftercare needs so that they can reassess your need for medications and monitor your lab values.  Any outstanding tests can be reviewed by your PCP at your follow up visit.  It is also important to review any medicine changes with your PCP.  Please bring these d/c instructions with you to your next visit so your physician can review these changes with you.  If you do not have a primary care physician, you can call [phone] for a physician referral.  It is highly recommended that you obtain a PCP for hospital follow up.  It is VERY IMPORTANT that you follow up with a PCP on a regular basis.  Check your blood glucoses before each meal and at bedtime and maintain a log of your readings.  Bring this log with you when you follow up with your PCP so that he or she can adjust your insulin at your follow up visit.     Face-to-face encounter (required for Medicare/Medicaid patients)    Complete by:  As directed   I RAMA,CHRISTINA certify that this patient is under my care and that I, or a nurse practitioner or physician's assistant working with  me, had a face-to-face encounter that meets the physician face-to-face encounter requirements with this patient on 08/08/2014. The encounter with the patient was in whole, or in part for the following medical condition(s) which is the primary reason for home health care (List medical condition): Unc DM, needs HH RN to assess safety to draw up and administer insulin, or teach family how to assist if patient unable to do this due to eyesight limitations.  The encounter with the patient was in whole, or in part, for the following medical condition, which is the primary reason for home health care:  Unc DM on insulin therapy  I certify that, based on my findings, the following services are medically necessary home health services:  Nursing  My clinical findings support the need for the above services:  OTHER SEE COMMENTS  Further, I certify that my clinical findings support that this patient is homebound due to:  Unable to leave home safely without assistance  Reason for Medically Necessary Home Health Services:   Skilled Nursing- Changes in Medication/Medication Management Skilled Nursing- Skilled Assessment/Observation Skilled Nursing- Teaching of Disease Process/Symptom Management       Home Health    Complete by:  As directed   To provide the following care/treatments:  RN            Medication List    STOP taking these medications       clindamycin 75 MG capsule  Commonly known as:  CLEOCIN     lisinopril 5 MG tablet  Commonly known as:  PRINIVIL,ZESTRIL      TAKE these medications       famotidine 20 MG tablet  Commonly known as:  PEPCID  Take 1 tablet (20 mg total) by mouth 2 (two) times daily.     hydrALAZINE 25 MG tablet  Commonly known as:  APRESOLINE  Take 1 tablet (25 mg total) by mouth 3 (three) times daily.     Insulin Lispro Prot & Lispro (75-25) 100 UNIT/ML Kwikpen  Commonly known as:  HUMALOG MIX 75/25 KWIKPEN  Inject 30 Units into the skin  2 (two) times daily.      lisinopril-hydrochlorothiazide 20-25 MG per tablet  Commonly known as:  PRINZIDE,ZESTORETIC  Take 1 tablet by mouth daily.     metoCLOPramide 10 MG tablet  Commonly known as:  REGLAN  Take 1 tablet (10 mg total) by mouth 4 (four) times daily -  before meals and at bedtime.     nystatin 100000 UNIT/GM Powd  Apply 1 g topically 2 (two) times daily.           Follow-up Information   Follow up with [facility]    . (please call to arrange appointment)    Contact information:   [place] [address] [phone]      Follow up with [facility]. ([place])    Contact information:   [address] [place] [address] [phone]        Time coordinating discharge: 35 minutes.  Signed:  RAMA,CHRISTINA  Pager [phone] [facility] 08/08/2014, 4:01 PM

## 2014-08-08 NOTE — Progress Notes (Signed)
CARE MANAGEMENT NOTE 08/08/2014  Patient:  Nancy [name], Nancy [name]   Account Number:  [id number]  Date Initiated:  08/05/2014  Documentation initiated by:  Dessa Phi  Subjective/Objective Assessment:   53 Y/O F ADMITTED W/N/V.DKA.HX:DM,HTN.     Action/Plan:   FROM HOME.   Anticipated DC Date:  08/09/2014   Anticipated DC Plan:  [place]  CM consult  Medication Assistance  [facility]      Choice offered to / List presented to:             Status of service:  Completed, signed off Medicare Important Message given?   (If response is "NO", the following Medicare IM given date fields will be blank) Date Medicare IM given:   Medicare IM given by:   Date Additional Medicare IM given:   Additional Medicare IM given by:    Discharge Disposition:  HOME/SELF CARE  Per UR Regulation:  Reviewed for med. necessity/level of care/duration of stay  If discussed at [place] of Stay Meetings, dates discussed:    Comments:  08/08/2014 1300 NCM spoke to pt and states she uses [facility] to get her medication. Pt will dc on 75/25 Novolog vial, will pick up from [facility] for $29. States her insurance does not cover her meds until she has met $6000 deductible. She still has a high copay for PCP visits and meds. Pt will plan to call [facility] to arrange appt for follow up post dc. States she has to pay $280 out of pocket to see her PCP and her last visit was 9 months ago. States she would receive a $50 check to reimburse for PCP visit. States she works for a [employer] and her monthly payment is $400 for her insurance. States her finances are limited and she cannot afford the high copay cost. Pt will check with her pharmacy to see if she can use the discount card, card may not be honored because she has insurance coverage. Jonnie Finner RN CCM Case Mgmt phone [phone]  08/06/14 KATHY MAHABIR RN,BSN NCM [phone] CHECKED INSURANCE FOR  [place]-$244/MONTH;PEN$366.83/MONTH.WILL PROVIDE DISCOUNT CARD FOR PATIENT.  08/05/14 KATHY MAHABIR RN,BSN NCM [phone] NO ANTICIPATED D/C NEEDS.

## 2014-08-16 NOTE — Progress Notes (Signed)
08/16/2014 1020 Pt called and states she has appt 9/1 at [facility] at 2:30 pm. Jonnie Finner RN CCM Case Mgmt phone [phone]

## 2014-08-17 ENCOUNTER — Telehealth: Payer: Self-pay | Admitting: Family Medicine

## 2014-08-17 ENCOUNTER — Ambulatory Visit: Payer: Managed Care, Other (non HMO) | Attending: Family Medicine | Admitting: Family Medicine

## 2014-08-17 ENCOUNTER — Encounter: Payer: Self-pay | Admitting: Family Medicine

## 2014-08-17 VITALS — BP 140/89 | HR 94 | Temp 98.2°F | Resp 16 | Ht 63.0 in | Wt 192.8 lb

## 2014-08-17 DIAGNOSIS — E1165 Type 2 diabetes mellitus with hyperglycemia: Secondary | ICD-10-CM

## 2014-08-17 DIAGNOSIS — IMO0002 Reserved for concepts with insufficient information to code with codable children: Secondary | ICD-10-CM

## 2014-08-17 DIAGNOSIS — E119 Type 2 diabetes mellitus without complications: Secondary | ICD-10-CM | POA: Diagnosis present

## 2014-08-17 DIAGNOSIS — E876 Hypokalemia: Secondary | ICD-10-CM

## 2014-08-17 DIAGNOSIS — Z794 Long term (current) use of insulin: Secondary | ICD-10-CM | POA: Diagnosis not present

## 2014-08-17 DIAGNOSIS — E1159 Type 2 diabetes mellitus with other circulatory complications: Secondary | ICD-10-CM

## 2014-08-17 DIAGNOSIS — E1149 Type 2 diabetes mellitus with other diabetic neurological complication: Secondary | ICD-10-CM

## 2014-08-17 DIAGNOSIS — Z23 Encounter for immunization: Secondary | ICD-10-CM

## 2014-08-17 DIAGNOSIS — E669 Obesity, unspecified: Secondary | ICD-10-CM | POA: Insufficient documentation

## 2014-08-17 DIAGNOSIS — I152 Hypertension secondary to endocrine disorders: Secondary | ICD-10-CM

## 2014-08-17 DIAGNOSIS — E1349 Other specified diabetes mellitus with other diabetic neurological complication: Secondary | ICD-10-CM

## 2014-08-17 DIAGNOSIS — E1169 Type 2 diabetes mellitus with other specified complication: Secondary | ICD-10-CM

## 2014-08-17 DIAGNOSIS — I10 Essential (primary) hypertension: Secondary | ICD-10-CM

## 2014-08-17 DIAGNOSIS — L02429 Furuncle of limb, unspecified: Secondary | ICD-10-CM

## 2014-08-17 DIAGNOSIS — E041 Nontoxic single thyroid nodule: Secondary | ICD-10-CM

## 2014-08-17 DIAGNOSIS — E785 Hyperlipidemia, unspecified: Secondary | ICD-10-CM

## 2014-08-17 DIAGNOSIS — E1365 Other specified diabetes mellitus with hyperglycemia: Secondary | ICD-10-CM

## 2014-08-17 DIAGNOSIS — L02439 Carbuncle of limb, unspecified: Secondary | ICD-10-CM

## 2014-08-17 DIAGNOSIS — E1010 Type 1 diabetes mellitus with ketoacidosis without coma: Secondary | ICD-10-CM

## 2014-08-17 LAB — BASIC METABOLIC PANEL
BUN: 10 mg/dL (ref 6–23)
CHLORIDE: 99 meq/L (ref 96–112)
CO2: 33 meq/L — AB (ref 19–32)
CREATININE: 0.73 mg/dL (ref 0.50–1.10)
Calcium: 10.3 mg/dL (ref 8.4–10.5)
Glucose, Bld: 203 mg/dL — ABNORMAL HIGH (ref 70–99)
POTASSIUM: 5.2 meq/L (ref 3.5–5.3)
SODIUM: 138 meq/L (ref 135–145)

## 2014-08-17 LAB — GLUCOSE, POCT (MANUAL RESULT ENTRY): POC Glucose: 222 mg/dl — AB (ref 70–99)

## 2014-08-17 LAB — TSH: TSH: 2.587 u[IU]/mL (ref 0.350–4.500)

## 2014-08-17 MED ORDER — ATORVASTATIN CALCIUM 40 MG PO TABS: 40.0000 mg | ORAL_TABLET | Freq: Every day | ORAL | Status: DC

## 2014-08-17 MED ORDER — INSULIN ASPART PROT & ASPART (70-30 MIX) 100 UNIT/ML ~~LOC~~ SUSP: 35.0000 [IU] | Freq: Two times a day (BID) | SUBCUTANEOUS | Status: DC

## 2014-08-17 MED ORDER — PREGABALIN 50 MG PO CAPS: 50.0000 mg | ORAL_CAPSULE | Freq: Three times a day (TID) | ORAL | Status: DC

## 2014-08-17 MED ORDER — METFORMIN HCL ER 500 MG PO TB24: 500.0000 mg | ORAL_TABLET | Freq: Every day | ORAL | Status: DC

## 2014-08-17 NOTE — Assessment & Plan Note (Signed)
Start statin.  

## 2014-08-17 NOTE — Progress Notes (Signed)
Establish Care HFU: feeling good now

## 2014-08-17 NOTE — Assessment & Plan Note (Signed)
Resolved during hospitalization

## 2014-08-17 NOTE — Telephone Encounter (Signed)
Opened in error

## 2014-08-17 NOTE — Assessment & Plan Note (Signed)
History of thyroid nodule. Plan for screening TSH.

## 2014-08-17 NOTE — Assessment & Plan Note (Signed)
A: BP at goal Meds: compliant P: Continue current regimen

## 2014-08-17 NOTE — Patient Instructions (Signed)
Nancy [name],  Thank you for coming in today. It was a pleasure meeting you. I look forward to be a primary doctor.  1. Regarding diabetes: For your diet:  Diet Recommendations for Diabetes   Starchy (carb) foods include: Bread, rice, pasta, potatoes, corn, crackers, bagels, muffins, all baked goods.   Protein foods include: Meat, fish, poultry, eggs, dairy foods, and beans such as pinto and kidney beans (beans also provide carbohydrate).   1. Eat at least 3 meals and 1-2 snacks per day. Never go more than 4-5 hours while awake without eating.  2. Limit starchy foods to TWO per meal and ONE per snack. ONE portion of a starchy  food is equal to the following:   - ONE slice of bread (or its equivalent, such as half of a hamburger bun).   - 1/2 cup of a "scoopable" starchy food such as potatoes or rice.   - 1 OUNCE (28 grams) of starchy snack foods such as crackers or pretzels (look on label).   - 15 grams of carbohydrate as shown on food label.  3. Both lunch and dinner should include a protein food, a carb food, and vegetables.   - Obtain twice as many veg's as protein or carbohydrate foods for both lunch and dinner.   - Try to keep frozen veg's on hand for a quick vegetable serving.     - Fresh or frozen veg's are best.  4. Breakfast should always include protein.    Other recommendations:  3. Exercise such that you sweat some and your heart rate goes up most days of the week.  4. Water, water, water  5. Check blood sugar 2-3 x per week to get a feel for how different foods effect your blood sugar:  Goal fasting 70-130  Goal after eating < 200 6. Beware of hypoglycemia which is blood sugar < 70 with or without symptoms.  Follow up nurse visit in 2 weeks for blood sugar log review and possible insulin dose adjustments based on sugars.  F/u with me in 4-6 weeks.  Dr. Adrian Blackwater

## 2014-08-17 NOTE — Assessment & Plan Note (Addendum)
Improved with resolution of DKA while in the hospital.  Repeat BMP

## 2014-08-17 NOTE — Assessment & Plan Note (Signed)
Resolved per patient.  

## 2014-08-17 NOTE — Assessment & Plan Note (Addendum)
A: uncontrolled diabetes with peripheral neuropathy  P: Start metformin in addition to insulin for improved glycemic control  Start lyrica for peripheral neuropathy  F/u A1c in 12/15  Start statin  RN f/u in 2 weeks for blood sugar log review  F/u wiht me in 4-6 weeks

## 2014-08-17 NOTE — Progress Notes (Signed)
   Subjective:    Patient ID: Nancy [name], female    DOB: [birth year], 53 y.o.   MRN: [id number] CC: HFU for DKA  HPI 53 year old female with diabetes presents to establish care for hospital followup for recent admission for DKA:  #1. HFU for DKA: diagnosed with diabetes in 2006. Previous A1c was 6-10. Over the past year she states insurance and health care has been less accessible to to high co-pay. She has been intermittently compliant with insulin. Since discharge from the hospital she has been compliant with insulin taking 35 units twice a day. She is also compliant with checking CBGs: Fasting: 177-198, 450  Postprandial: 155 Symptoms: tingling/numnbness in feet and hands, vision impairment x 3 months,  Denies: nausea, vomiting, abdominal pain, chest pain, SOB   Social history: Chronic nonsmoker Review of Systems As per history of present illness and    Objective:   Physical Exam BP 140/89  Pulse 94  Temp(Src) 98.2 F (36.8 C) (Oral)  Resp 16  Ht 5\' 3"  (1.6 m)  Wt 192 lb 12.8 oz (87.454 kg)  BMI 34.16 kg/m2  SpO2 98% General appearance: alert, cooperative and no distress Head: Normocephalic, without obvious abnormality, atraumatic Eyes: conjunctivae/corneas clear. PERRL, EOM's intact.  Ears: normal TM's and external ear canals both ears Nose: Nares normal. Septum midline. Mucosa normal. No drainage or sinus tenderness. Throat: lips, mucosa, and tongue normal; teeth and gums normal Neck: no adenopathy, supple, symmetrical, trachea midline and thyroid not enlarged, symmetric, no tenderness/mass/nodules Lungs: clear to auscultation bilaterally Heart: regular rate and rhythm, S1, S2 normal, no murmur, click, rub or gallop Abdomen: soft, non-tender; bowel sounds normal; no masses,  no organomegaly Extremities: extremities normal, atraumatic, no cyanosis or edema Skin: dry and cracked on feet     Lab Results  Component Value Date   HGBA1C 13.4* 08/04/2014   Diabetic  foot exam done: normal foot shape, dry skin, no previous ulcers, ROM normal, skin warm and dry  Pulses: diminished both feet Monofilament: normal except in both heels      Assessment & Plan:

## 2014-08-18 ENCOUNTER — Telehealth: Payer: Self-pay | Admitting: *Deleted

## 2014-08-18 LAB — MICROALBUMIN / CREATININE URINE RATIO
Creatinine, Urine: 50.9 mg/dL
MICROALB UR: 0.5 mg/dL (ref 0.00–1.89)
MICROALB/CREAT RATIO: 9.8 mg/g (ref 0.0–30.0)

## 2014-08-18 NOTE — Telephone Encounter (Signed)
Pt aware of lab results and will continue taking Prinzide

## 2014-08-18 NOTE — Telephone Encounter (Signed)
Message copied by Betti Cruz on Wed Aug 18, 2014  2:38 PM ------      Message from: Boykin Nearing      Created: Wed Aug 18, 2014  9:28 AM       Please inform patient.       BMP wnl except slightly elevated non fasting blood sugar.      Normal urine microalbumin, continue prinzide.      Normal TSH. ------

## 2014-08-21 ENCOUNTER — Encounter (HOSPITAL_COMMUNITY): Payer: Self-pay | Admitting: Emergency Medicine

## 2014-08-21 ENCOUNTER — Emergency Department (HOSPITAL_COMMUNITY): Payer: Managed Care, Other (non HMO)

## 2014-08-21 ENCOUNTER — Emergency Department (HOSPITAL_COMMUNITY)
Admission: EM | Admit: 2014-08-21 | Discharge: 2014-08-21 | Disposition: A | Discharge status: Home / Self Care | Payer: Managed Care, Other (non HMO) | Attending: Emergency Medicine | Admitting: Emergency Medicine

## 2014-08-21 DIAGNOSIS — Z79899 Other long term (current) drug therapy: Secondary | ICD-10-CM | POA: Insufficient documentation

## 2014-08-21 DIAGNOSIS — Z8619 Personal history of other infectious and parasitic diseases: Secondary | ICD-10-CM | POA: Insufficient documentation

## 2014-08-21 DIAGNOSIS — Z794 Long term (current) use of insulin: Secondary | ICD-10-CM | POA: Diagnosis not present

## 2014-08-21 DIAGNOSIS — I10 Essential (primary) hypertension: Secondary | ICD-10-CM | POA: Insufficient documentation

## 2014-08-21 DIAGNOSIS — F3289 Other specified depressive episodes: Secondary | ICD-10-CM | POA: Diagnosis not present

## 2014-08-21 DIAGNOSIS — H9190 Unspecified hearing loss, unspecified ear: Secondary | ICD-10-CM | POA: Diagnosis not present

## 2014-08-21 DIAGNOSIS — R112 Nausea with vomiting, unspecified: Secondary | ICD-10-CM

## 2014-08-21 DIAGNOSIS — R109 Unspecified abdominal pain: Secondary | ICD-10-CM | POA: Insufficient documentation

## 2014-08-21 DIAGNOSIS — E785 Hyperlipidemia, unspecified: Secondary | ICD-10-CM | POA: Insufficient documentation

## 2014-08-21 DIAGNOSIS — K589 Irritable bowel syndrome without diarrhea: Secondary | ICD-10-CM | POA: Diagnosis not present

## 2014-08-21 DIAGNOSIS — Z8781 Personal history of (healed) traumatic fracture: Secondary | ICD-10-CM | POA: Insufficient documentation

## 2014-08-21 DIAGNOSIS — E119 Type 2 diabetes mellitus without complications: Secondary | ICD-10-CM | POA: Diagnosis not present

## 2014-08-21 DIAGNOSIS — F329 Major depressive disorder, single episode, unspecified: Secondary | ICD-10-CM | POA: Insufficient documentation

## 2014-08-21 DIAGNOSIS — Z862 Personal history of diseases of the blood and blood-forming organs and certain disorders involving the immune mechanism: Secondary | ICD-10-CM | POA: Insufficient documentation

## 2014-08-21 LAB — CBC WITH DIFFERENTIAL/PLATELET
Basophils Absolute: 0.1 10*3/uL (ref 0.0–0.1)
Basophils Relative: 1 % (ref 0–1)
EOS PCT: 1 % (ref 0–5)
Eosinophils Absolute: 0.1 10*3/uL (ref 0.0–0.7)
HCT: 41.1 % (ref 36.0–46.0)
Hemoglobin: 14.3 g/dL (ref 12.0–15.0)
LYMPHS ABS: 1.9 10*3/uL (ref 0.7–4.0)
LYMPHS PCT: 19 % (ref 12–46)
MCH: 28.4 pg (ref 26.0–34.0)
MCHC: 34.8 g/dL (ref 30.0–36.0)
MCV: 81.7 fL (ref 78.0–100.0)
Monocytes Absolute: 0.5 10*3/uL (ref 0.1–1.0)
Monocytes Relative: 5 % (ref 3–12)
NEUTROS ABS: 7.3 10*3/uL (ref 1.7–7.7)
NEUTROS PCT: 74 % (ref 43–77)
Platelets: 301 10*3/uL (ref 150–400)
RBC: 5.03 MIL/uL (ref 3.87–5.11)
RDW: 12.3 % (ref 11.5–15.5)
WBC: 9.8 10*3/uL (ref 4.0–10.5)

## 2014-08-21 LAB — COMPREHENSIVE METABOLIC PANEL
ALT: 12 U/L (ref 0–35)
AST: 11 U/L (ref 0–37)
Albumin: 3.8 g/dL (ref 3.5–5.2)
Alkaline Phosphatase: 71 U/L (ref 39–117)
Anion gap: 15 (ref 5–15)
BUN: 12 mg/dL (ref 6–23)
CO2: 26 meq/L (ref 19–32)
Calcium: 10.4 mg/dL (ref 8.4–10.5)
Chloride: 94 mEq/L — ABNORMAL LOW (ref 96–112)
Creatinine, Ser: 0.73 mg/dL (ref 0.50–1.10)
GLUCOSE: 337 mg/dL — AB (ref 70–99)
POTASSIUM: 4.4 meq/L (ref 3.7–5.3)
SODIUM: 135 meq/L — AB (ref 137–147)
Total Bilirubin: 0.6 mg/dL (ref 0.3–1.2)
Total Protein: 8.4 g/dL — ABNORMAL HIGH (ref 6.0–8.3)

## 2014-08-21 LAB — URINALYSIS, ROUTINE W REFLEX MICROSCOPIC
BILIRUBIN URINE: NEGATIVE
Hgb urine dipstick: NEGATIVE
Ketones, ur: 40 mg/dL — AB
LEUKOCYTES UA: NEGATIVE
NITRITE: NEGATIVE
PH: 6 (ref 5.0–8.0)
Protein, ur: NEGATIVE mg/dL
Specific Gravity, Urine: 1.025 (ref 1.005–1.030)
Urobilinogen, UA: 0.2 mg/dL (ref 0.0–1.0)

## 2014-08-21 LAB — I-STAT TROPONIN, ED: Troponin i, poc: 0 ng/mL (ref 0.00–0.08)

## 2014-08-21 LAB — URINE MICROSCOPIC-ADD ON

## 2014-08-21 LAB — LIPASE, BLOOD: Lipase: 35 U/L (ref 11–59)

## 2014-08-21 LAB — CBG MONITORING, ED: Glucose-Capillary: 302 mg/dL — ABNORMAL HIGH (ref 70–99)

## 2014-08-21 MED ORDER — IOHEXOL 300 MG/ML  SOLN
100.0000 mL | Freq: Once | INTRAMUSCULAR | Status: AC | PRN
  Administered 2014-08-21: 100 mL via INTRAVENOUS

## 2014-08-21 MED ORDER — ONDANSETRON HCL 4 MG/2ML IJ SOLN
4.0000 mg | Freq: Once | INTRAMUSCULAR | Status: AC
  Administered 2014-08-21: 4 mg via INTRAVENOUS
  Filled 2014-08-21: qty 2

## 2014-08-21 MED ORDER — IOHEXOL 300 MG/ML  SOLN
50.0000 mL | Freq: Once | INTRAMUSCULAR | Status: AC | PRN
  Administered 2014-08-21: 50 mL via ORAL

## 2014-08-21 MED ORDER — DICYCLOMINE HCL 20 MG PO TABS: 20.0000 mg | ORAL_TABLET | Freq: Three times a day (TID) | ORAL | Status: DC | PRN

## 2014-08-21 MED ORDER — ONDANSETRON 8 MG PO TBDP: 8.0000 mg | ORAL_TABLET | Freq: Three times a day (TID) | ORAL | Status: DC | PRN

## 2014-08-21 MED ORDER — METOCLOPRAMIDE HCL 5 MG/ML IJ SOLN
10.0000 mg | Freq: Once | INTRAMUSCULAR | Status: AC
  Administered 2014-08-21: 10 mg via INTRAVENOUS
  Filled 2014-08-21: qty 2

## 2014-08-21 MED ORDER — MORPHINE SULFATE 4 MG/ML IJ SOLN
6.0000 mg | Freq: Once | INTRAMUSCULAR | Status: AC
  Administered 2014-08-21: 6 mg via INTRAVENOUS
  Filled 2014-08-21: qty 2

## 2014-08-21 MED ORDER — ONDANSETRON HCL 4 MG/2ML IJ SOLN: 4.0000 mg | Freq: Once | INTRAMUSCULAR | Status: DC

## 2014-08-21 MED ORDER — MORPHINE SULFATE 4 MG/ML IJ SOLN
4.0000 mg | Freq: Once | INTRAMUSCULAR | Status: AC
  Administered 2014-08-21: 4 mg via INTRAVENOUS
  Filled 2014-08-21: qty 1

## 2014-08-21 NOTE — ED Notes (Signed)
Pt from home reports that she started vomiting around 1am. Pt has hx of HTN and DM II. Pt husband reports that pt had fever PTA. Pt is A&O and in NAD

## 2014-08-21 NOTE — Discharge Instructions (Signed)

## 2014-08-21 NOTE — ED Notes (Signed)
MD Campos at the bedside answering patient's questions

## 2014-08-21 NOTE — ED Notes (Signed)
Pt actively vomiting when ambulating to BR. Dr Venora Maples notified

## 2014-08-21 NOTE — ED Provider Notes (Signed)
CSN: [id number]     Arrival date & time 08/21/14  0932 History   First MD Initiated Contact with Patient 08/21/14 0940     Chief Complaint  Patient presents with  . Emesis      HPI Patient presents the emergency department complaining of nausea and vomiting.  She denies diarrhea.  She has a history of IBS.  She denies hematemesis.  No fevers or chills.  Does report decreased oral intake over the past 24 hours.  Actively vomiting on arrival to the emergency department.  No urinary symptoms.  Denies lower abdominal pain.  Reports crampy upper abdominal pain.  Symptoms are moderate to severe in severity at this time.  Nothing worsens or improves her pain.   Past Medical History  Diagnosis Date  . HOH (hard of hearing) 2004   . Fracture of left ankle 1997   . Shingles 2009   . IBS (irritable bowel syndrome) 2002   . Leukopenia 2015   . Anemia 2006  . Diabetes mellitus with neurological manifestation 2006  . Hyperlipidemia 2006  . Hypertension 2006  . Depression 2014    previously on amitryptiline   . Thyroid nodule 2004   Past Surgical History  Procedure Laterality Date  . Abdominal hysterectomy  2005  . Cesarean section  1983    Family History  Problem Relation Age of Onset  . Hypertension Mother   . Heart disease Mother   . Diabetes Mother   . Thyroid disease Mother   . Congestive Heart Failure Mother   . Breast cancer Maternal Aunt   . Breast cancer Maternal Grandmother   . Colon cancer Maternal Grandfather   . Heart attack Sister   . Heart disease Brother   . Hyperlipidemia Brother   . Hypertension Brother   . Diabetes Father    History  Substance Use Topics  . Smoking status: Never Smoker   . Smokeless tobacco: Never Used  . Alcohol Use: Yes     Comment: occasion    OB History   Grav Para Term Preterm Abortions TAB SAB Ect Mult Living                 Review of Systems  All other systems reviewed and are negative.     Allergies  Hydrocodone and  Sulfur  Home Medications   Prior to Admission medications   Medication Sig Start Date End Date Taking? Authorizing Provider  acetaminophen (TYLENOL) 500 MG tablet Take 1,000 mg by mouth every 6 (six) hours as needed for mild pain.   Yes Historical Provider, MD  atorvastatin (LIPITOR) 40 MG tablet Take 1 tablet (40 mg total) by mouth daily. 08/17/14  Yes Josalyn C Funches, MD  famotidine (PEPCID) 20 MG tablet Take 1 tablet (20 mg total) by mouth 2 (two) times daily. 08/08/14  Yes Venetia Maxon Rama, MD  hydrALAZINE (APRESOLINE) 25 MG tablet Take 1 tablet (25 mg total) by mouth 3 (three) times daily. 08/08/14  Yes Venetia Maxon Rama, MD  insulin aspart protamine- aspart (NOVOLOG MIX 70/30) (70-30) 100 UNIT/ML injection Inject 0.35 mLs (35 Units total) into the skin 2 (two) times daily with a meal. 08/17/14  Yes Josalyn C Funches, MD  lisinopril-hydrochlorothiazide (PRINZIDE,ZESTORETIC) 20-25 MG per tablet Take 1 tablet by mouth daily. 08/08/14  Yes Venetia Maxon Rama, MD  metFORMIN (GLUCOPHAGE XR) 500 MG 24 hr tablet Take 1 tablet (500 mg total) by mouth daily with breakfast. 08/17/14  Yes Minerva Ends, MD  dicyclomine (  BENTYL) 20 MG tablet Take 1 tablet (20 mg total) by mouth every 8 (eight) hours as needed for spasms. 08/21/14   Hoy Morn, MD  ondansetron (ZOFRAN ODT) 8 MG disintegrating tablet Take 1 tablet (8 mg total) by mouth every 8 (eight) hours as needed for nausea or vomiting. 08/21/14   Hoy Morn, MD   BP 156/94  Pulse 100  Temp(Src) 98.4 F (36.9 C) (Oral)  Resp 14  SpO2 98% Physical Exam  Nursing note and vitals reviewed. Constitutional: She is oriented to person, place, and time. She appears well-developed and well-nourished. No distress.  HENT:  Head: Normocephalic and atraumatic.  Eyes: EOM are normal.  Neck: Normal range of motion.  Cardiovascular: Normal rate, regular rhythm and normal heart sounds.   Pulmonary/Chest: Effort normal and breath sounds normal.  Abdominal:  Soft. She exhibits no distension.  Mild upper abdominal tenderness without guarding or rebound  Musculoskeletal: Normal range of motion.  Neurological: She is alert and oriented to person, place, and time.  Skin: Skin is warm and dry.  Psychiatric: She has a normal mood and affect. Judgment normal.    ED Course  Procedures (including critical care time) Labs Review Labs Reviewed  COMPREHENSIVE METABOLIC PANEL - Abnormal; Notable for the following:    Sodium 135 (*)    Chloride 94 (*)    Glucose, Bld 337 (*)    Total Protein 8.4 (*)    All other components within normal limits  URINALYSIS, ROUTINE W REFLEX MICROSCOPIC - Abnormal; Notable for the following:    Glucose, UA >1000 (*)    Ketones, ur 40 (*)    All other components within normal limits  URINE MICROSCOPIC-ADD ON - Abnormal; Notable for the following:    Squamous Epithelial / LPF FEW (*)    All other components within normal limits  CBG MONITORING, ED - Abnormal; Notable for the following:    Glucose-Capillary 302 (*)    All other components within normal limits  CBC WITH DIFFERENTIAL  LIPASE, BLOOD  I-STAT TROPOININ, ED  CBG MONITORING, ED    Imaging Review Ct Abdomen Pelvis W Contrast  08/21/2014   CLINICAL DATA:  Nausea and vomiting  EXAM: CT ABDOMEN AND PELVIS WITH CONTRAST  TECHNIQUE: Multidetector CT imaging of the abdomen and pelvis was performed using the standard protocol following bolus administration of intravenous contrast.  CONTRAST:  7mL OMNIPAQUE IOHEXOL 300 MG/ML SOLN, 153mL OMNIPAQUE IOHEXOL 300 MG/ML SOLN  COMPARISON:  None.  FINDINGS: Liver, gallbladder, adrenal glands, kidneys, and pancreas are unremarkable. Splenic granuloma incidentally noted. There is minimal hypoenhancement of the pancreatic uncinate process, adjacent duodenal wall thickening, and surrounding stranding. No pancreatic ductal dilatation. No surrounding fluid collection. 7 mm short axis porta hepatis node identified image 24. No free  air. No evidence for bowel obstruction.  No other bowel wall thickening or focal segmental dilatation. The appendix is normal. The bladder is normal. Ovaries are unremarkable. Uterus presumed surgically absent. No acute osseous abnormality.  IMPRESSION: Second portion of duodenum borderline wall thickening, surrounding stranding, and hypoenhancement of the uncinate process of the pancreas; these findings suggest groove pancreatitis. Correlate with amylase and lipase. No complicating features such as abscess/ pseudocyst or bowel obstruction.   Electronically Signed   By: Conchita Paris M.D.   On: 08/21/2014 13:40     EKG Interpretation   Date/Time:  Saturday August 21 2014 09:48:14 EDT Ventricular Rate:  103 PR Interval:  144 QRS Duration: 84 QT Interval:  349  QTC Calculation: 457 R Axis:   -29 Text Interpretation:  Sinus tachycardia Borderline left axis deviation  Abnormal R-wave progression, late transition No significant change since  last tracing Confirmed by [place]  MD, Jeffers [phone]) on 08/21/2014 9:56:42  AM      MDM   Final diagnoses:  Abdominal pain, unspecified abdominal location  Nausea and vomiting, vomiting of unspecified type    3:04 PM CT scan without significant abnormality.  Questionable mild inflammation around the pancreas.  Lipase is normal.  Patient feels much better at this time.  Tolerating oral fluids.  Outpatient PCP followup and GI followup.  She understands to return to the ER for new or worsening symptoms    Hoy Morn, MD 08/21/14 [phone]

## 2014-08-21 NOTE — ED Notes (Signed)
Per pt, same symptoms in August-started feeling nauseated yesterday-actively vomiting

## 2014-08-22 ENCOUNTER — Encounter (HOSPITAL_COMMUNITY): Payer: Self-pay | Admitting: Emergency Medicine

## 2014-08-22 ENCOUNTER — Emergency Department (HOSPITAL_COMMUNITY): Payer: Managed Care, Other (non HMO)

## 2014-08-22 ENCOUNTER — Inpatient Hospital Stay (HOSPITAL_COMMUNITY)
Admission: EM | Admit: 2014-08-22 | Discharge: 2014-08-26 | DRG: 391 | Disposition: A | Discharge status: Home / Self Care | Payer: Managed Care, Other (non HMO) | Attending: Internal Medicine | Admitting: Internal Medicine

## 2014-08-22 DIAGNOSIS — Z79899 Other long term (current) drug therapy: Secondary | ICD-10-CM

## 2014-08-22 DIAGNOSIS — K209 Esophagitis, unspecified without bleeding: Secondary | ICD-10-CM | POA: Diagnosis not present

## 2014-08-22 DIAGNOSIS — R109 Unspecified abdominal pain: Secondary | ICD-10-CM

## 2014-08-22 DIAGNOSIS — E876 Hypokalemia: Secondary | ICD-10-CM

## 2014-08-22 DIAGNOSIS — R634 Abnormal weight loss: Secondary | ICD-10-CM | POA: Diagnosis present

## 2014-08-22 DIAGNOSIS — K226 Gastro-esophageal laceration-hemorrhage syndrome: Secondary | ICD-10-CM | POA: Diagnosis present

## 2014-08-22 DIAGNOSIS — R1110 Vomiting, unspecified: Secondary | ICD-10-CM

## 2014-08-22 DIAGNOSIS — F329 Major depressive disorder, single episode, unspecified: Secondary | ICD-10-CM | POA: Diagnosis present

## 2014-08-22 DIAGNOSIS — E785 Hyperlipidemia, unspecified: Secondary | ICD-10-CM | POA: Diagnosis present

## 2014-08-22 DIAGNOSIS — E1165 Type 2 diabetes mellitus with hyperglycemia: Secondary | ICD-10-CM

## 2014-08-22 DIAGNOSIS — Z794 Long term (current) use of insulin: Secondary | ICD-10-CM

## 2014-08-22 DIAGNOSIS — IMO0002 Reserved for concepts with insufficient information to code with codable children: Secondary | ICD-10-CM

## 2014-08-22 DIAGNOSIS — I10 Essential (primary) hypertension: Secondary | ICD-10-CM

## 2014-08-22 DIAGNOSIS — E669 Obesity, unspecified: Secondary | ICD-10-CM | POA: Diagnosis present

## 2014-08-22 DIAGNOSIS — R739 Hyperglycemia, unspecified: Secondary | ICD-10-CM

## 2014-08-22 DIAGNOSIS — K589 Irritable bowel syndrome without diarrhea: Secondary | ICD-10-CM | POA: Diagnosis present

## 2014-08-22 DIAGNOSIS — Z6832 Body mass index (BMI) 32.0-32.9, adult: Secondary | ICD-10-CM

## 2014-08-22 DIAGNOSIS — F3289 Other specified depressive episodes: Secondary | ICD-10-CM | POA: Diagnosis present

## 2014-08-22 DIAGNOSIS — E1149 Type 2 diabetes mellitus with other diabetic neurological complication: Secondary | ICD-10-CM | POA: Diagnosis present

## 2014-08-22 DIAGNOSIS — E1310 Other specified diabetes mellitus with ketoacidosis without coma: Secondary | ICD-10-CM | POA: Diagnosis present

## 2014-08-22 DIAGNOSIS — H9190 Unspecified hearing loss, unspecified ear: Secondary | ICD-10-CM | POA: Diagnosis present

## 2014-08-22 DIAGNOSIS — K2980 Duodenitis without bleeding: Secondary | ICD-10-CM

## 2014-08-22 LAB — COMPREHENSIVE METABOLIC PANEL
ALBUMIN: 3.7 g/dL (ref 3.5–5.2)
ALT: 12 U/L (ref 0–35)
AST: 15 U/L (ref 0–37)
Alkaline Phosphatase: 66 U/L (ref 39–117)
Anion gap: 20 — ABNORMAL HIGH (ref 5–15)
BUN: 15 mg/dL (ref 6–23)
CO2: 22 mEq/L (ref 19–32)
CREATININE: 0.66 mg/dL (ref 0.50–1.10)
Calcium: 9.4 mg/dL (ref 8.4–10.5)
Chloride: 95 mEq/L — ABNORMAL LOW (ref 96–112)
GFR calc Af Amer: >90 mL/min (ref >90)
GFR calc non Af Amer: >90 mL/min (ref >90)
Glucose, Bld: 171 mg/dL — ABNORMAL HIGH (ref 70–99)
Potassium: 3.7 mEq/L (ref 3.7–5.3)
Sodium: 137 mEq/L (ref 137–147)
TOTAL PROTEIN: 7.9 g/dL (ref 6.0–8.3)
Total Bilirubin: 0.5 mg/dL (ref 0.3–1.2)

## 2014-08-22 LAB — URINALYSIS, ROUTINE W REFLEX MICROSCOPIC
Glucose, UA: NEGATIVE mg/dL
HGB URINE DIPSTICK: NEGATIVE
Ketones, ur: >80 mg/dL — AB
Leukocytes, UA: NEGATIVE
Nitrite: NEGATIVE
PROTEIN: 30 mg/dL — AB
Specific Gravity, Urine: 1.022 (ref 1.005–1.030)
UROBILINOGEN UA: 0.2 mg/dL (ref 0.0–1.0)
pH: 6 (ref 5.0–8.0)

## 2014-08-22 LAB — URINE MICROSCOPIC-ADD ON

## 2014-08-22 LAB — CBC WITH DIFFERENTIAL/PLATELET
BASOS ABS: 0.1 10*3/uL (ref 0.0–0.1)
Basophils Relative: 1 % (ref 0–1)
Eosinophils Absolute: 0 10*3/uL (ref 0.0–0.7)
Eosinophils Relative: 0 % (ref 0–5)
HCT: 39.6 % (ref 36.0–46.0)
Hemoglobin: 13.4 g/dL (ref 12.0–15.0)
LYMPHS ABS: 2 10*3/uL (ref 0.7–4.0)
Lymphocytes Relative: 17 % (ref 12–46)
MCH: 28 pg (ref 26.0–34.0)
MCHC: 33.8 g/dL (ref 30.0–36.0)
MCV: 82.7 fL (ref 78.0–100.0)
Monocytes Absolute: 0.3 10*3/uL (ref 0.1–1.0)
Monocytes Relative: 3 % (ref 3–12)
Neutro Abs: 9.1 10*3/uL — ABNORMAL HIGH (ref 1.7–7.7)
Neutrophils Relative %: 79 % — ABNORMAL HIGH (ref 43–77)
Platelets: 292 10*3/uL (ref 150–400)
RBC: 4.79 MIL/uL (ref 3.87–5.11)
RDW: 12.2 % (ref 11.5–15.5)
WBC: 11.5 10*3/uL — AB (ref 4.0–10.5)

## 2014-08-22 LAB — BLOOD GAS, VENOUS
Acid-base deficit: 0.7 mmol/L (ref 0.0–2.0)
BICARBONATE: 24.8 meq/L — AB (ref 20.0–24.0)
FIO2: 0.21 %
O2 Saturation: 70.4 %
PATIENT TEMPERATURE: 98.6
PH VEN: 7.35 — AB (ref 7.250–7.300)
TCO2: 22 mmol/L (ref 0–100)
pCO2, Ven: 46.3 mmHg (ref 45.0–50.0)
pO2, Ven: 40.5 mmHg (ref 30.0–45.0)

## 2014-08-22 LAB — LIPASE, BLOOD: LIPASE: 22 U/L (ref 11–59)

## 2014-08-22 MED ORDER — SODIUM CHLORIDE 0.9 % IV BOLUS (SEPSIS)
1000.0000 mL | Freq: Once | INTRAVENOUS | Status: AC
  Administered 2014-08-22: 1000 mL via INTRAVENOUS

## 2014-08-22 MED ORDER — HYDROMORPHONE HCL PF 1 MG/ML IJ SOLN
1.0000 mg | Freq: Once | INTRAMUSCULAR | Status: AC
  Administered 2014-08-22: 1 mg via INTRAVENOUS
  Filled 2014-08-22: qty 1

## 2014-08-22 MED ORDER — ONDANSETRON HCL 4 MG/2ML IJ SOLN
4.0000 mg | Freq: Once | INTRAMUSCULAR | Status: AC
  Administered 2014-08-22: 4 mg via INTRAVENOUS
  Filled 2014-08-22: qty 2

## 2014-08-22 NOTE — ED Notes (Signed)
Patient is from home. Patient in with N/V that started this morning. Patient was given 4mg  zofran. C/O SOB, patient has hx anxiety.

## 2014-08-22 NOTE — ED Notes (Signed)
Bed: WA20 Expected date:  Expected time:  Means of arrival:  Comments: EMS/53 yo female with SOB/hx pancreatitis-nausea and vomiting all day

## 2014-08-22 NOTE — ED Provider Notes (Signed)
CSN: [id number]     Arrival date & time 08/22/14  1945 History   First MD Initiated Contact with Patient 08/22/14 1956     Chief Complaint  Patient presents with  . Nausea  . Emesis     (Consider location/radiation/quality/duration/timing/severity/associated sxs/prior Treatment) HPI Comments: Patient with h/o IDDM, hospitalization for DKA from 08/19-08/23/2015, HTN -- presents with uncontrolled N/V, upper abdominal pain with radiation to back. Patient was seen in emergency department yesterday for the same symptoms, was treated, felt better, had CT with question of pancreatitis but normal lipase. She was discharged home with Bentyl and Zofran. She is not able to keep these down. Patient became worse again this morning with uncontrolled nausea and vomiting. She denies alcohol use, no h/o gallbladder disease. She also complains of SOB with vomiting. No fever, URI sx, CP, diarrhea, urinary symptoms, skin rash. The onset of this condition was acute. The course is constant. Aggravating factors: none. Alleviating factors: none.    Patient is a 53 y.o. female presenting with vomiting. The history is provided by the patient and medical records.  Emesis Associated symptoms: abdominal pain   Associated symptoms: no diarrhea, no headaches, no myalgias and no sore throat     Past Medical History  Diagnosis Date  . HOH (hard of hearing) 2004   . Fracture of left ankle 1997   . Shingles 2009   . IBS (irritable bowel syndrome) 2002   . Leukopenia 2015   . Anemia 2006  . Diabetes mellitus with neurological manifestation 2006  . Hyperlipidemia 2006  . Hypertension 2006  . Depression 2014    previously on amitryptiline   . Thyroid nodule 2004   Past Surgical History  Procedure Laterality Date  . Abdominal hysterectomy  2005  . Cesarean section  1983    Family History  Problem Relation Age of Onset  . Hypertension Mother   . Heart disease Mother   . Diabetes Mother   . Thyroid disease Mother    . Congestive Heart Failure Mother   . Breast cancer Maternal Aunt   . Breast cancer Maternal Grandmother   . Colon cancer Maternal Grandfather   . Heart attack Sister   . Heart disease Brother   . Hyperlipidemia Brother   . Hypertension Brother   . Diabetes Father    History  Substance Use Topics  . Smoking status: Never Smoker   . Smokeless tobacco: Never Used  . Alcohol Use: Yes     Comment: occasion    OB History   Grav Para Term Preterm Abortions TAB SAB Ect Mult Living                 Review of Systems  Constitutional: Negative for fever.  HENT: Negative for rhinorrhea and sore throat.   Eyes: Negative for redness.  Respiratory: Positive for shortness of breath. Negative for cough.   Cardiovascular: Negative for chest pain.  Gastrointestinal: Positive for nausea, vomiting and abdominal pain. Negative for diarrhea, constipation, blood in stool and abdominal distention.  Genitourinary: Positive for frequency. Negative for dysuria.  Musculoskeletal: Negative for myalgias.  Skin: Negative for rash.  Neurological: Negative for headaches.      Allergies  Hydrocodone and Sulfur  Home Medications   Prior to Admission medications   Medication Sig Start Date End Date Taking? Authorizing Provider  acetaminophen (TYLENOL) 500 MG tablet Take 1,000 mg by mouth every 6 (six) hours as needed for mild pain.   Yes Historical Provider, MD  atorvastatin (  LIPITOR) 40 MG tablet Take 1 tablet (40 mg total) by mouth daily. 08/17/14  Yes Josalyn C Funches, MD  dicyclomine (BENTYL) 20 MG tablet Take 1 tablet (20 mg total) by mouth every 8 (eight) hours as needed for spasms. 08/21/14  Yes Hoy Morn, MD  famotidine (PEPCID) 20 MG tablet Take 1 tablet (20 mg total) by mouth 2 (two) times daily. 08/08/14  Yes Venetia Maxon Rama, MD  hydrALAZINE (APRESOLINE) 25 MG tablet Take 1 tablet (25 mg total) by mouth 3 (three) times daily. 08/08/14  Yes Venetia Maxon Rama, MD  insulin aspart protamine-  aspart (NOVOLOG MIX 70/30) (70-30) 100 UNIT/ML injection Inject 0.35 mLs (35 Units total) into the skin 2 (two) times daily with a meal. 08/17/14  Yes Josalyn C Funches, MD  lisinopril-hydrochlorothiazide (PRINZIDE,ZESTORETIC) 20-25 MG per tablet Take 1 tablet by mouth daily. 08/08/14  Yes Venetia Maxon Rama, MD  ondansetron (ZOFRAN ODT) 8 MG disintegrating tablet Take 1 tablet (8 mg total) by mouth every 8 (eight) hours as needed for nausea or vomiting. 08/21/14  Yes Hoy Morn, MD   BP 185/97  Pulse 108  Temp(Src) 97.9 F (36.6 C) (Oral)  Resp 24  SpO2 100%  Physical Exam  Nursing note and vitals reviewed. Constitutional: She appears well-developed and well-nourished.  HENT:  Head: Normocephalic and atraumatic.  Eyes: Conjunctivae are normal. Right eye exhibits no discharge. Left eye exhibits no discharge.  Neck: Normal range of motion. Neck supple.  Cardiovascular: Normal rate, regular rhythm and normal heart sounds.   No murmur heard. Pulmonary/Chest: Effort normal and breath sounds normal.  Abdominal: Soft. Bowel sounds are normal. There is tenderness in the epigastric area and left upper quadrant. There is no rigidity, no rebound, no guarding, no tenderness at McBurney's point and negative Murphy's sign.  Musculoskeletal: She exhibits no edema and no tenderness.  Neurological: She is alert.  Skin: Skin is warm and dry.  Psychiatric: She has a normal mood and affect.    ED Course  Procedures (including critical care time) Labs Review Labs Reviewed  URINALYSIS, ROUTINE W REFLEX MICROSCOPIC - Abnormal; Notable for the following:    APPearance CLOUDY (*)    Bilirubin Urine SMALL (*)    Ketones, ur >80 (*)    Protein, ur 30 (*)    All other components within normal limits  CBC WITH DIFFERENTIAL - Abnormal; Notable for the following:    WBC 11.5 (*)    Neutrophils Relative % 79 (*)    Neutro Abs 9.1 (*)    All other components within normal limits  COMPREHENSIVE METABOLIC  PANEL - Abnormal; Notable for the following:    Chloride 95 (*)    Glucose, Bld 171 (*)    Anion gap 20 (*)    All other components within normal limits  URINE MICROSCOPIC-ADD ON - Abnormal; Notable for the following:    Squamous Epithelial / LPF FEW (*)    Casts HYALINE CASTS (*)    All other components within normal limits  BLOOD GAS, VENOUS - Abnormal; Notable for the following:    pH, Ven 7.350 (*)    Bicarbonate 24.8 (*)    All other components within normal limits  CBG MONITORING, ED - Abnormal; Notable for the following:    Glucose-Capillary 192 (*)    All other components within normal limits  LIPASE, BLOOD  CBC WITH DIFFERENTIAL  CBG MONITORING, ED    Imaging Review Ct Abdomen Pelvis W Contrast  08/21/2014  CLINICAL DATA:  Nausea and vomiting  EXAM: CT ABDOMEN AND PELVIS WITH CONTRAST  TECHNIQUE: Multidetector CT imaging of the abdomen and pelvis was performed using the standard protocol following bolus administration of intravenous contrast.  CONTRAST:  49mL OMNIPAQUE IOHEXOL 300 MG/ML SOLN, 174mL OMNIPAQUE IOHEXOL 300 MG/ML SOLN  COMPARISON:  None.  FINDINGS: Liver, gallbladder, adrenal glands, kidneys, and pancreas are unremarkable. Splenic granuloma incidentally noted. There is minimal hypoenhancement of the pancreatic uncinate process, adjacent duodenal wall thickening, and surrounding stranding. No pancreatic ductal dilatation. No surrounding fluid collection. 7 mm short axis porta hepatis node identified image 24. No free air. No evidence for bowel obstruction.  No other bowel wall thickening or focal segmental dilatation. The appendix is normal. The bladder is normal. Ovaries are unremarkable. Uterus presumed surgically absent. No acute osseous abnormality.  IMPRESSION: Second portion of duodenum borderline wall thickening, surrounding stranding, and hypoenhancement of the uncinate process of the pancreas; these findings suggest groove pancreatitis. Correlate with amylase and  lipase. No complicating features such as abscess/ pseudocyst or bowel obstruction.   Electronically Signed   By: Conchita Paris M.D.   On: 08/21/2014 13:40     EKG Interpretation None      9:02 PM Patient seen and examined. Work-up initiated. Medications ordered.   Vital signs reviewed and are as follows: BP 185/97  Pulse 108  Temp(Src) 97.9 F (36.6 C) (Oral)  Resp 24  SpO2 100%  12:32 AM Patient discussed with and seen by Dr. Tomi Bamberger. Patient continues to have vomiting. Query mild DKA, pancreatitis? She has vomited after PO trial of water. At this point she is not well enough to go home. Will consult hospitalist for admission.   12:54 AM Spoke with Dr. [place] Pink who will see patient in ED.   CBG improved to 192.    MDM   Final diagnoses:  Intractable vomiting with nausea, vomiting of unspecified type  Intractable abdominal pain  Hyperglycemia  Essential hypertension   Patient with intractable pain and vomiting, hyperglycemia improved with fluids. Her symptoms however, are not improved with treatment in ED and she is not well enough to go home after 5 hrs of observation in ED.     Carlisle Cater, PA-C 08/23/14 [phone]

## 2014-08-22 NOTE — ED Notes (Signed)
Lab called for recollect on Lt green and Lav.  Informed RN and Tech.

## 2014-08-23 ENCOUNTER — Encounter (HOSPITAL_COMMUNITY): Payer: Self-pay | Admitting: Internal Medicine

## 2014-08-23 DIAGNOSIS — K2980 Duodenitis without bleeding: Secondary | ICD-10-CM

## 2014-08-23 DIAGNOSIS — I10 Essential (primary) hypertension: Secondary | ICD-10-CM | POA: Diagnosis present

## 2014-08-23 DIAGNOSIS — F329 Major depressive disorder, single episode, unspecified: Secondary | ICD-10-CM | POA: Diagnosis present

## 2014-08-23 DIAGNOSIS — K589 Irritable bowel syndrome without diarrhea: Secondary | ICD-10-CM | POA: Diagnosis present

## 2014-08-23 DIAGNOSIS — E1149 Type 2 diabetes mellitus with other diabetic neurological complication: Secondary | ICD-10-CM | POA: Diagnosis present

## 2014-08-23 DIAGNOSIS — F3289 Other specified depressive episodes: Secondary | ICD-10-CM | POA: Diagnosis present

## 2014-08-23 DIAGNOSIS — E785 Hyperlipidemia, unspecified: Secondary | ICD-10-CM | POA: Diagnosis present

## 2014-08-23 DIAGNOSIS — Z794 Long term (current) use of insulin: Secondary | ICD-10-CM | POA: Diagnosis not present

## 2014-08-23 DIAGNOSIS — K226 Gastro-esophageal laceration-hemorrhage syndrome: Secondary | ICD-10-CM | POA: Diagnosis present

## 2014-08-23 DIAGNOSIS — Z79899 Other long term (current) drug therapy: Secondary | ICD-10-CM | POA: Diagnosis not present

## 2014-08-23 DIAGNOSIS — R634 Abnormal weight loss: Secondary | ICD-10-CM | POA: Diagnosis present

## 2014-08-23 DIAGNOSIS — E1310 Other specified diabetes mellitus with ketoacidosis without coma: Secondary | ICD-10-CM | POA: Diagnosis present

## 2014-08-23 DIAGNOSIS — H9190 Unspecified hearing loss, unspecified ear: Secondary | ICD-10-CM | POA: Diagnosis present

## 2014-08-23 DIAGNOSIS — Z6832 Body mass index (BMI) 32.0-32.9, adult: Secondary | ICD-10-CM | POA: Diagnosis not present

## 2014-08-23 DIAGNOSIS — E876 Hypokalemia: Secondary | ICD-10-CM | POA: Diagnosis present

## 2014-08-23 DIAGNOSIS — E669 Obesity, unspecified: Secondary | ICD-10-CM | POA: Diagnosis present

## 2014-08-23 DIAGNOSIS — K209 Esophagitis, unspecified without bleeding: Secondary | ICD-10-CM | POA: Diagnosis present

## 2014-08-23 DIAGNOSIS — R1115 Cyclical vomiting syndrome unrelated to migraine: Secondary | ICD-10-CM

## 2014-08-23 DIAGNOSIS — R109 Unspecified abdominal pain: Secondary | ICD-10-CM | POA: Diagnosis present

## 2014-08-23 LAB — CBG MONITORING, ED: GLUCOSE-CAPILLARY: 192 mg/dL — AB (ref 70–99)

## 2014-08-23 LAB — GLUCOSE, CAPILLARY
GLUCOSE-CAPILLARY: 87 mg/dL (ref 70–99)
GLUCOSE-CAPILLARY: 92 mg/dL (ref 70–99)
Glucose-Capillary: 221 mg/dL — ABNORMAL HIGH (ref 70–99)
Glucose-Capillary: 169 mg/dL — ABNORMAL HIGH (ref 70–99)
Glucose-Capillary: 87 mg/dL (ref 70–99)

## 2014-08-23 LAB — COMPREHENSIVE METABOLIC PANEL
ALT: 10 U/L (ref 0–35)
ANION GAP: 20 — AB (ref 5–15)
AST: 12 U/L (ref 0–37)
Albumin: 3.5 g/dL (ref 3.5–5.2)
Alkaline Phosphatase: 68 U/L (ref 39–117)
BUN: 17 mg/dL (ref 6–23)
CALCIUM: 9.4 mg/dL (ref 8.4–10.5)
CO2: 22 meq/L (ref 19–32)
Chloride: 96 mEq/L (ref 96–112)
Creatinine, Ser: 0.65 mg/dL (ref 0.50–1.10)
GLUCOSE: 228 mg/dL — AB (ref 70–99)
Potassium: 3.5 mEq/L — ABNORMAL LOW (ref 3.7–5.3)
Sodium: 138 mEq/L (ref 137–147)
TOTAL PROTEIN: 7.9 g/dL (ref 6.0–8.3)
Total Bilirubin: 0.4 mg/dL (ref 0.3–1.2)

## 2014-08-23 LAB — BASIC METABOLIC PANEL
Anion gap: 18 — ABNORMAL HIGH (ref 5–15)
BUN: 17 mg/dL (ref 6–23)
CO2: 23 meq/L (ref 19–32)
Calcium: 9.2 mg/dL (ref 8.4–10.5)
Chloride: 96 mEq/L (ref 96–112)
Creatinine, Ser: 0.67 mg/dL (ref 0.50–1.10)
GFR calc Af Amer: >90 mL/min (ref >90)
GFR calc non Af Amer: >90 mL/min (ref >90)
GLUCOSE: 216 mg/dL — AB (ref 70–99)
POTASSIUM: 4 meq/L (ref 3.7–5.3)
SODIUM: 137 meq/L (ref 137–147)

## 2014-08-23 LAB — CBC
HCT: 40.9 % (ref 36.0–46.0)
HEMOGLOBIN: 13.7 g/dL (ref 12.0–15.0)
MCH: 27.9 pg (ref 26.0–34.0)
MCHC: 33.5 g/dL (ref 30.0–36.0)
MCV: 83.3 fL (ref 78.0–100.0)
PLATELETS: 325 10*3/uL (ref 150–400)
RBC: 4.91 MIL/uL (ref 3.87–5.11)
RDW: 12.4 % (ref 11.5–15.5)
WBC: 12.4 10*3/uL — ABNORMAL HIGH (ref 4.0–10.5)

## 2014-08-23 LAB — LACTIC ACID, PLASMA: LACTIC ACID, VENOUS: 1.4 mmol/L (ref 0.5–2.2)

## 2014-08-23 LAB — TROPONIN I

## 2014-08-23 MED ORDER — MORPHINE SULFATE 2 MG/ML IJ SOLN
2.0000 mg | INTRAMUSCULAR | Status: DC | PRN
  Administered 2014-08-23 – 2014-08-25 (×7): 2 mg via INTRAVENOUS
  Filled 2014-08-23 (×7): qty 1

## 2014-08-23 MED ORDER — ONDANSETRON HCL 4 MG/2ML IJ SOLN
4.0000 mg | Freq: Four times a day (QID) | INTRAMUSCULAR | Status: DC | PRN
  Administered 2014-08-23 – 2014-08-25 (×8): 4 mg via INTRAVENOUS
  Filled 2014-08-23 (×7): qty 2

## 2014-08-23 MED ORDER — HYDRALAZINE HCL 20 MG/ML IJ SOLN
10.0000 mg | Freq: Four times a day (QID) | INTRAMUSCULAR | Status: DC | PRN
  Administered 2014-08-23 – 2014-08-25 (×4): 10 mg via INTRAVENOUS
  Filled 2014-08-23 (×4): qty 1

## 2014-08-23 MED ORDER — ACETAMINOPHEN 325 MG PO TABS: 650.0000 mg | ORAL_TABLET | Freq: Four times a day (QID) | ORAL | Status: DC | PRN

## 2014-08-23 MED ORDER — SODIUM CHLORIDE 0.9 % IJ SOLN
3.0000 mL | Freq: Two times a day (BID) | INTRAMUSCULAR | Status: DC
  Administered 2014-08-23 – 2014-08-26 (×4): 3 mL via INTRAVENOUS

## 2014-08-23 MED ORDER — ONDANSETRON HCL 4 MG PO TABS: 4.0000 mg | ORAL_TABLET | Freq: Four times a day (QID) | ORAL | Status: DC | PRN

## 2014-08-23 MED ORDER — INSULIN ASPART 100 UNIT/ML ~~LOC~~ SOLN
0.0000 [IU] | SUBCUTANEOUS | Status: DC
  Administered 2014-08-23: 5 [IU] via SUBCUTANEOUS
  Administered 2014-08-23: 3 [IU] via SUBCUTANEOUS

## 2014-08-23 MED ORDER — SODIUM CHLORIDE 0.45 % IV SOLN
INTRAVENOUS | Status: AC
  Administered 2014-08-23: 03:00:00 via INTRAVENOUS

## 2014-08-23 MED ORDER — HYDRALAZINE HCL 20 MG/ML IJ SOLN
10.0000 mg | Freq: Once | INTRAMUSCULAR | Status: AC
  Administered 2014-08-23: 10 mg via INTRAVENOUS
  Filled 2014-08-23: qty 1

## 2014-08-23 MED ORDER — ACETAMINOPHEN 650 MG RE SUPP: 650.0000 mg | Freq: Four times a day (QID) | RECTAL | Status: DC | PRN

## 2014-08-23 MED ORDER — PANTOPRAZOLE SODIUM 40 MG IV SOLR
40.0000 mg | Freq: Two times a day (BID) | INTRAVENOUS | Status: DC
  Administered 2014-08-23 – 2014-08-25 (×7): 40 mg via INTRAVENOUS
  Filled 2014-08-23 (×9): qty 40

## 2014-08-23 MED ORDER — ONDANSETRON HCL 4 MG/2ML IJ SOLN
4.0000 mg | Freq: Once | INTRAMUSCULAR | Status: AC
  Administered 2014-08-23: 4 mg via INTRAVENOUS
  Filled 2014-08-23: qty 2

## 2014-08-23 MED ORDER — POTASSIUM CHLORIDE IN NACL 40-0.9 MEQ/L-% IV SOLN
INTRAVENOUS | Status: AC
  Administered 2014-08-23: 125 mL/h via INTRAVENOUS
  Filled 2014-08-23 (×2): qty 1000

## 2014-08-23 MED ORDER — ALBUTEROL SULFATE (2.5 MG/3ML) 0.083% IN NEBU: 2.5000 mg | INHALATION_SOLUTION | RESPIRATORY_TRACT | Status: DC | PRN

## 2014-08-23 MED ORDER — INSULIN ASPART PROT & ASPART (70-30 MIX) 100 UNIT/ML ~~LOC~~ SUSP
20.0000 [IU] | Freq: Two times a day (BID) | SUBCUTANEOUS | Status: DC
  Administered 2014-08-23 (×2): 20 [IU] via SUBCUTANEOUS
  Filled 2014-08-23: qty 10

## 2014-08-23 MED ORDER — INSULIN GLARGINE 100 UNIT/ML ~~LOC~~ SOLN
25.0000 [IU] | Freq: Every day | SUBCUTANEOUS | Status: DC
  Filled 2014-08-23: qty 0.25

## 2014-08-23 MED ORDER — INSULIN ASPART 100 UNIT/ML ~~LOC~~ SOLN
0.0000 [IU] | SUBCUTANEOUS | Status: DC
  Administered 2014-08-24: 2 [IU] via SUBCUTANEOUS
  Administered 2014-08-24 (×3): 3 [IU] via SUBCUTANEOUS
  Administered 2014-08-25 (×3): 2 [IU] via SUBCUTANEOUS
  Administered 2014-08-25 (×2): 3 [IU] via SUBCUTANEOUS
  Administered 2014-08-26 (×2): 1 [IU] via SUBCUTANEOUS

## 2014-08-23 MED ORDER — HYDROMORPHONE HCL PF 1 MG/ML IJ SOLN
0.5000 mg | INTRAMUSCULAR | Status: DC | PRN
  Administered 2014-08-23 (×3): 0.5 mg via INTRAVENOUS
  Filled 2014-08-23 (×2): qty 1

## 2014-08-23 NOTE — Consult Note (Signed)
Subjective:   HPI  The patient is a 53 year old female with a history of diabetes mellitus, hypertension, and hyperlipidemia. She was in this hospital 3 weeks ago with diabetic ketoacidosis. She was subsequently discharged. She return to the hospital emergency room where she was subsequently admitted because of ongoing problems with upper abdominal pain, nausea, and vomiting. Patient states that she has been vomiting for 3 months off and on. She got better after her last admission for diabetic ketoacidosis but then the symptoms recurred a few days ago and she has continued to have problems with persistent vomiting. It was suggested on her last admission that a gastric emptying study be done to see if she might have gastroparesis. She takes Aleve twice a day for neck pain. A CT scan of the abdomen was done on this admission which showed findings of duodenitis and findings suggestive of groove pancreatitis. She does not drink alcohol. She has lost 40 pounds in the past year.  Review of Systems Denies chest pain or shortness of breath  Past Medical History  Diagnosis Date  . HOH (hard of hearing) 2004   . Fracture of left ankle 1997   . Shingles 2009   . IBS (irritable bowel syndrome) 2002   . Leukopenia 2015   . Anemia 2006  . Diabetes mellitus with neurological manifestation 2006  . Hyperlipidemia 2006  . Hypertension 2006  . Depression 2014    previously on amitryptiline   . Thyroid nodule 2004   Past Surgical History  Procedure Laterality Date  . Abdominal hysterectomy  2005  . Cesarean section  1983    History   Social History  . Marital Status: Married    Spouse Name: [name]    Number of Children: 1  . Years of Education: some colle   Occupational History  . Employed FT as Animator    Social History Main Topics  . Smoking status: Never Smoker   . Smokeless tobacco: Never Used  . Alcohol Use: Yes     Comment: occasion   . Drug Use: No  . Sexual  Activity: Yes    Birth Control/ Protection: None   Other Topics Concern  . Not on file   Social History Narrative   Married.  Lives with husband and daughter.  Works full time in Engineer, structural (high stress).   Caring for mother who's health is failing, starting dialysis.    family history includes Breast cancer in her maternal aunt and maternal grandmother; Colon cancer in her maternal grandfather; Congestive Heart Failure in her mother; Diabetes in her father and mother; Heart attack in her sister; Heart disease in her brother and mother; Hyperlipidemia in her brother; Hypertension in her brother and mother; Thyroid disease in her mother. Current facility-administered medications:0.45 % sodium chloride infusion, , Intravenous, Continuous, Bonnielee Haff, MD, Last Rate: 75 mL/hr at 08/23/14 0250;  acetaminophen (TYLENOL) suppository 650 mg, 650 mg, Rectal, Q6H PRN, Bonnielee Haff, MD;  acetaminophen (TYLENOL) tablet 650 mg, 650 mg, Oral, Q6H PRN, Bonnielee Haff, MD;  albuterol (PROVENTIL) (2.5 MG/3ML) 0.083% nebulizer solution 2.5 mg, 2.5 mg, Nebulization, Q2H PRN, Bonnielee Haff, MD hydrALAZINE (APRESOLINE) injection 10 mg, 10 mg, Intravenous, Q6H PRN, Bonnielee Haff, MD, 10 mg at 08/23/14 1023;  HYDROmorphone (DILAUDID) injection 0.5 mg, 0.5 mg, Intravenous, Q3H PRN, Bonnielee Haff, MD, 0.5 mg at 08/23/14 1023;  insulin aspart (novoLOG) injection 0-15 Units, 0-15 Units, Subcutaneous, 6 times per day, Bonnielee Haff, MD, 3  Units at 08/23/14 0825 insulin aspart protamine- aspart (NOVOLOG MIX 70/30) injection 20 Units, 20 Units, Subcutaneous, BID WC, Bonnielee Haff, MD, 20 Units at 08/23/14 0826;  ondansetron [facility]) injection 4 mg, 4 mg, Intravenous, Q6H PRN, Bonnielee Haff, MD, 4 mg at 08/23/14 0840;  ondansetron (ZOFRAN) tablet 4 mg, 4 mg, Oral, Q6H PRN, Bonnielee Haff, MD;  pantoprazole (PROTONIX) injection 40 mg, 40 mg, Intravenous, Q12H, Bonnielee Haff, MD, 40 mg at 08/23/14  1023 sodium chloride 0.9 % injection 3 mL, 3 mL, Intravenous, Q12H, Bonnielee Haff, MD, 3 mL at 08/23/14 0450 Allergies  Allergen Reactions  . Hydrocodone Other (See Comments)    Upset stomach  . Sulfur Hives     Objective:     BP 186/106  Pulse 116  Temp(Src) 98.2 F (36.8 C) (Oral)  Resp 24  Ht 5\' 3"  (1.6 m)  Wt 84 kg (185 lb 3 oz)  BMI 32.81 kg/m2  SpO2 100%  She is in no distress  Nonicteric  Heart regular rhythm no murmurs  Lungs clear  Abdomen: Bowel sounds present, soft, tender in the epigastric area    Laboratory No components found with this basename: d1      Assessment:     #1. Diabetes mellitus  #2. Upper abdominal pain  #3. Persistent vomiting  #4. 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 Lipase normal.          Plan:     The patient has several potential reasons for her vomiting. She could have gastroparesis. She could have duodenitis or peptic disease from Aleve. She could have groove pancreatitis. The groove pancreatitis is a segmental chronic pancreatitis that affects the anatomical area between the pancreatic head, duodenum, and common bile duct. Clinical symptoms are weight loss, upper abdominal pain, vomiting, and nausea often do to duodenal stenosis. A differential diagnosis of groove pancreatitis from peri pancreatic cancer is clinically important. Endoscopic ultrasound may be necessary to further investigate. For now I would recommend continuing PPI therapy, keeping the patient n.p.o. I will have Dr. Paulita Fujita look into this further tomorrow. She may need an EUS to further investigate.    Component Value Date/Time   WBC 12.4* 08/23/2014 0612   WBC 12.2* 07/08/2008 1551   HGB 13.7 08/23/2014 0612   HGB 12.5 07/08/2008 1551   HCT 40.9 08/23/2014 0612   HCT 37.3 07/08/2008 1551   PLT 325 08/23/2014 0612   PLT 294 07/08/2008 1551   ALT 10 08/23/2014 0612   AST 12 08/23/2014 0612   NA 138 08/23/2014 0612   K 3.5* 08/23/2014 0612   CL 96 08/23/2014  0612   CREATININE 0.65 08/23/2014 0612   CREATININE 0.73 08/17/2014 1641   BUN 17 08/23/2014 0612   CO2 22 08/23/2014 0612   CALCIUM 9.4 08/23/2014 0612   ALKPHOS 68 08/23/2014 0612    Lab Results  Component Value Date   HGB 13.7 08/23/2014   HGB 13.4 08/22/2014   HGB 14.3 08/21/2014   HGB 12.5 07/08/2008   HCT 40.9 08/23/2014   HCT 39.6 08/22/2014   HCT 41.1 08/21/2014   HCT 37.3 07/08/2008   ALKPHOS 68 08/23/2014   ALKPHOS 66 08/22/2014   ALKPHOS 71 08/21/2014   AST 12 08/23/2014   AST 15 08/22/2014   AST 11 08/21/2014   ALT 10 08/23/2014   ALT 12 08/22/2014   ALT 12 08/21/2014

## 2014-08-23 NOTE — H&P (Signed)
[facility] History and Physical  Nancy [name] LSL:[id number] DOB: August 06, 1961 DOA: 08/22/2014   PCP: Minerva Ends, MD  Specialists: None  Chief Complaint: Abdominal pain with nausea and vomiting  HPI: Nancy [name] is a 53 y.o. female with a past medical history of diabetes on insulin, hypertension, hyperlipidemia, who was in his usual state of health about 2 weeks ago, when she started having pain in the upper abdominal area with nausea and vomiting. The symptoms have gotten worse in the last 4 or 5 days. She's had innumerable episodes of vomiting. Lately she has seen small amounts of blood in the emesis as well. The pain is 7-8 or 10 in intensity, radiating to the back. Denies any fever, but has had chills. Denies any diarrhea. She tells me that over the last one year she has lost about 40 pounds. She's had a colonoscopy in the past, but cannot remember who performed the procedure. It was done in 2007. She actually presented to the emergency department yesterday with similar symptoms. She underwent a CT scan, which showed duodenitis, with possibility of pancreatitis. However, her lipase was normal. She was proved given symptomatic treatment and was discharged home. However, her symptoms have not improved.  Home Medications: Prior to Admission medications   Medication Sig Start Date End Date Taking? Authorizing Provider  acetaminophen (TYLENOL) 500 MG tablet Take 1,000 mg by mouth every 6 (six) hours as needed for mild pain.   Yes Historical Provider, MD  atorvastatin (LIPITOR) 40 MG tablet Take 1 tablet (40 mg total) by mouth daily. 08/17/14  Yes Josalyn C Funches, MD  dicyclomine (BENTYL) 20 MG tablet Take 1 tablet (20 mg total) by mouth every 8 (eight) hours as needed for spasms. 08/21/14  Yes Hoy Morn, MD  famotidine (PEPCID) 20 MG tablet Take 1 tablet (20 mg total) by mouth 2 (two) times daily. 08/08/14  Yes Venetia Maxon Rama, MD  hydrALAZINE (APRESOLINE) 25 MG  tablet Take 1 tablet (25 mg total) by mouth 3 (three) times daily. 08/08/14  Yes Venetia Maxon Rama, MD  insulin aspart protamine- aspart (NOVOLOG MIX 70/30) (70-30) 100 UNIT/ML injection Inject 0.35 mLs (35 Units total) into the skin 2 (two) times daily with a meal. 08/17/14  Yes Josalyn C Funches, MD  lisinopril-hydrochlorothiazide (PRINZIDE,ZESTORETIC) 20-25 MG per tablet Take 1 tablet by mouth daily. 08/08/14  Yes Venetia Maxon Rama, MD  ondansetron (ZOFRAN ODT) 8 MG disintegrating tablet Take 1 tablet (8 mg total) by mouth every 8 (eight) hours as needed for nausea or vomiting. 08/21/14  Yes Hoy Morn, MD    Allergies:  Allergies  Allergen Reactions  . Hydrocodone Other (See Comments)    Upset stomach  . Sulfur Hives    Past Medical History: Past Medical History  Diagnosis Date  . HOH (hard of hearing) 2004   . Fracture of left ankle 1997   . Shingles 2009   . IBS (irritable bowel syndrome) 2002   . Leukopenia 2015   . Anemia 2006  . Diabetes mellitus with neurological manifestation 2006  . Hyperlipidemia 2006  . Hypertension 2006  . Depression 2014    previously on amitryptiline   . Thyroid nodule 2004    Past Surgical History  Procedure Laterality Date  . Abdominal hysterectomy  2005  . Cesarean section  18     Social History: She lives with her husband. Denies smoking, alcohol use illicit drug use. Independent with daily activities  Family History:  Family  History  Problem Relation Age of Onset  . Hypertension Mother   . Heart disease Mother   . Diabetes Mother   . Thyroid disease Mother   . Congestive Heart Failure Mother   . Breast cancer Maternal Aunt   . Breast cancer Maternal Grandmother   . Colon cancer Maternal Grandfather   . Heart attack Sister   . Heart disease Brother   . Hyperlipidemia Brother   . Hypertension Brother   . Diabetes Father      Review of Systems - History obtained from the patient General ROS: positive for  -  fatigue Psychological ROS: negative Ophthalmic ROS: negative ENT ROS: negative Allergy and Immunology ROS: negative Hematological and Lymphatic ROS: negative Endocrine ROS: negative Respiratory ROS: no cough, shortness of breath, or wheezing Cardiovascular ROS: no chest pain or dyspnea on exertion Gastrointestinal ROS: as in hpi Genito-Urinary ROS: no dysuria, trouble voiding, or hematuria Musculoskeletal ROS: negative Neurological ROS: no TIA or stroke symptoms Dermatological ROS: negative  Physical Examination  Filed Vitals:   08/22/14 1947 08/22/14 1948 08/22/14 2230  BP:  185/97 193/107  Pulse:  108 107  Temp:  97.9 F (36.6 C)   TempSrc:  Oral   Resp:  24 14  SpO2: 99% 100% 95%    BP 193/107  Pulse 107  Temp(Src) 97.9 F (36.6 C) (Oral)  Resp 14  SpO2 95%  General appearance: alert, cooperative, appears stated age, no distress and moderately obese Head: Normocephalic, without obvious abnormality, atraumatic Eyes: conjunctivae/corneas clear. PERRL, EOM's intact. Throat: Dry mucous membranes Neck: no adenopathy, no carotid bruit, no JVD, supple, symmetrical, trachea midline and thyroid not enlarged, symmetric, no tenderness/mass/nodules Resp: clear to auscultation bilaterally Cardio: regular rate and rhythm, S1, S2 normal, no murmur, click, rub or gallop GI: Soft. Tender in the upper abdomen without any rebound, rigidity, or guarding. No masses, organomegaly. Extremities: extremities normal, atraumatic, no cyanosis or edema Pulses: 2+ and symmetric Skin: Skin color, texture, turgor normal. No rashes or lesions Lymph nodes: Cervical, supraclavicular, and axillary nodes normal. Neurologic: Alert and oriented x3. No focal neurological deficits are noted  Laboratory Data: Results for orders placed during the hospital encounter of 08/22/14 (from the past 48 hour(s))  CBC WITH DIFFERENTIAL     Status: Abnormal   Collection Time    08/22/14  9:45 PM      Result Value  Ref Range   WBC 11.5 (*) 4.0 - 10.5 K/uL   RBC 4.79  3.87 - 5.11 MIL/uL   Hemoglobin 13.4  12.0 - 15.0 g/dL   HCT 39.6  36.0 - 46.0 %   MCV 82.7  78.0 - 100.0 fL   MCH 28.0  26.0 - 34.0 pg   MCHC 33.8  30.0 - 36.0 g/dL   RDW 12.2  11.5 - 15.5 %   Platelets 292  150 - 400 K/uL   Neutrophils Relative % 79 (*) 43 - 77 %   Lymphocytes Relative 17  12 - 46 %   Monocytes Relative 3  3 - 12 %   Eosinophils Relative 0  0 - 5 %   Basophils Relative 1  0 - 1 %   Neutro Abs 9.1 (*) 1.7 - 7.7 K/uL   Lymphs Abs 2.0  0.7 - 4.0 K/uL   Monocytes Absolute 0.3  0.1 - 1.0 K/uL   Eosinophils Absolute 0.0  0.0 - 0.7 K/uL   Basophils Absolute 0.1  0.0 - 0.1 K/uL   Smear Review MORPHOLOGY UNREMARKABLE  COMPREHENSIVE METABOLIC PANEL     Status: Abnormal   Collection Time    08/22/14  9:45 PM      Result Value Ref Range   Sodium 137  137 - 147 mEq/L   Potassium 3.7  3.7 - 5.3 mEq/L   Chloride 95 (*) 96 - 112 mEq/L   CO2 22  19 - 32 mEq/L   Glucose, Bld 171 (*) 70 - 99 mg/dL   BUN 15  6 - 23 mg/dL   Creatinine, Ser 0.66  0.50 - 1.10 mg/dL   Calcium 9.4  8.4 - 10.5 mg/dL   Total Protein 7.9  6.0 - 8.3 g/dL   Albumin 3.7  3.5 - 5.2 g/dL   AST 15  0 - 37 U/L   ALT 12  0 - 35 U/L   Alkaline Phosphatase 66  39 - 117 U/L   Total Bilirubin 0.5  0.3 - 1.2 mg/dL   GFR calc non Af Amer >90  >90 mL/min   GFR calc Af Amer >90  >90 mL/min   Comment: (NOTE)     The eGFR has been calculated using the CKD EPI equation.     This calculation has not been validated in all clinical situations.     eGFR's persistently <90 mL/min signify possible Chronic Kidney     Disease.   Anion gap 20 (*) 5 - 15  LIPASE, BLOOD     Status: None   Collection Time    08/22/14  9:45 PM      Result Value Ref Range   Lipase 22  11 - 59 U/L  URINALYSIS, ROUTINE W REFLEX MICROSCOPIC     Status: Abnormal   Collection Time    08/22/14 10:10 PM      Result Value Ref Range   Color, Urine YELLOW  YELLOW   APPearance CLOUDY (*)  CLEAR   Specific Gravity, Urine 1.022  1.005 - 1.030   pH 6.0  5.0 - 8.0   Glucose, UA NEGATIVE  NEGATIVE mg/dL   Hgb urine dipstick NEGATIVE  NEGATIVE   Bilirubin Urine SMALL (*) NEGATIVE   Ketones, ur >80 (*) NEGATIVE mg/dL   Protein, ur 30 (*) NEGATIVE mg/dL   Urobilinogen, UA 0.2  0.0 - 1.0 mg/dL   Nitrite NEGATIVE  NEGATIVE   Leukocytes, UA NEGATIVE  NEGATIVE  URINE MICROSCOPIC-ADD ON     Status: Abnormal   Collection Time    08/22/14 10:10 PM      Result Value Ref Range   Squamous Epithelial / LPF FEW (*) RARE   WBC, UA 0-2  <3 WBC/hpf   RBC / HPF 0-2  <3 RBC/hpf   Bacteria, UA RARE  RARE   Casts HYALINE CASTS (*) NEGATIVE   Urine-Other MUCOUS PRESENT    BLOOD GAS, VENOUS     Status: Abnormal   Collection Time    08/22/14 11:17 PM      Result Value Ref Range   FIO2 0.21     pH, Ven 7.350 (*) 7.250 - 7.300   pCO2, Ven 46.3  45.0 - 50.0 mmHg   pO2, Ven 40.5  30.0 - 45.0 mmHg   Bicarbonate 24.8 (*) 20.0 - 24.0 mEq/L   TCO2 22.0  0 - 100 mmol/L   Acid-base deficit 0.7  0.0 - 2.0 mmol/L   O2 Saturation 70.4     Patient temperature 98.6     Collection site VEIN     Drawn by [place]  Sample type VEIN    CBG MONITORING, ED     Status: Abnormal   Collection Time    08/23/14 12:42 AM      Result Value Ref Range   Glucose-Capillary 192 (*) 70 - 99 mg/dL    Radiology Reports: Dg Chest 2 View  08/22/2014   CLINICAL DATA:  Nausea and emesis. Chest and abdomen pain. Weakness.  EXAM: CHEST  2 VIEW  COMPARISON:  07/1914  FINDINGS: The heart size and mediastinal contours are within normal limits. Both lungs are clear. The visualized skeletal structures are unremarkable.  IMPRESSION: No active cardiopulmonary disease.   Electronically Signed   By: Lucienne Capers M.D.   On: 08/22/2014 21:30   Ct Abdomen Pelvis W Contrast  08/21/2014   CLINICAL DATA:  Nausea and vomiting  EXAM: CT ABDOMEN AND PELVIS WITH CONTRAST  TECHNIQUE: Multidetector CT imaging of the abdomen  and pelvis was performed using the standard protocol following bolus administration of intravenous contrast.  CONTRAST:  16m OMNIPAQUE IOHEXOL 300 MG/ML SOLN, 1035mOMNIPAQUE IOHEXOL 300 MG/ML SOLN  COMPARISON:  None.  FINDINGS: Liver, gallbladder, adrenal glands, kidneys, and pancreas are unremarkable. Splenic granuloma incidentally noted. There is minimal hypoenhancement of the pancreatic uncinate process, adjacent duodenal wall thickening, and surrounding stranding. No pancreatic ductal dilatation. No surrounding fluid collection. 7 mm short axis porta hepatis node identified image 24. No free air. No evidence for bowel obstruction.  No other bowel wall thickening or focal segmental dilatation. The appendix is normal. The bladder is normal. Ovaries are unremarkable. Uterus presumed surgically absent. No acute osseous abnormality.  IMPRESSION: Second portion of duodenum borderline wall thickening, surrounding stranding, and hypoenhancement of the uncinate process of the pancreas; these findings suggest groove pancreatitis. Correlate with amylase and lipase. No complicating features such as abscess/ pseudocyst or bowel obstruction.   Electronically Signed   By: GrConchita Paris.D.   On: 08/21/2014 13:40    Electrocardiogram: Sinus tachycardia at 102 beats per minute. Left axis deviation. No Q waves. No concerning ST or T-wave changes.  Problem List  Principal Problem:   Duodenitis Active Problems:   Type II diabetes mellitus with neurological manifestations, uncontrolled   Hypertension associated with diabetes   Hyperlipidemia associated with type 2 diabetes mellitus   Obesity (BMI 30-39.9)   Assessment: This is a [age]ear old, African American female, with a past medical history as stated earlier, presents with nausea vomiting, and upper abdominal pain. CT scans shows findings suggestive of duodenitis. Lipase is normal. LFTs are normal as well. She has mild hyperglycemia with evidence for mild  DKA. She did take her insulin on the morning of September 6. She might have had a mild case of Mallory-Weiss tear.  Plan: #1 nausea, vomiting, and abdominal pain, likely secondary to duodenitis with possible mild Mallory-Weiss tear: She'll be given proton pump inhibitors intravenously. Gastroenterology will be consulted as she will likely require endoscopy. Discussed with Dr. GaPenelope Coopith EaSadie HaberI. She'll be kept n.p.o. for now. There could be an element of diabetic gastroparesis as well. Troponin was normal 2 days ago. This will be repeated.  #2 type 2 diabetes, uncontrolled, with mild DKA: She has been hydrated, in the ED. We will repeat her Bmet at this time. There is no clear indication for intravenous insulin therapy. If the repeat bmet looks worse in terms of acidosis this may have to be considered. I think she can just be placed on her usual insulin regimen. Continue with IV fluids. HbA1c was  13.4 on August 19.  #3 poorly controlled hypertension: Give hydralazine as needed. Resume her home medications as tolerated. Monitor blood pressures closely.  #4 history of weight loss: This will need to be pursued as an outpatient if no clear cause is found on testing to be performed during this hospitalization. TSH was normal on September 1.  DVT Prophylaxis: SCDs for now Code Status: Full code Family Communication: Discussed with the patient and her husband  Disposition Plan: Admit to Tele   Further management decisions will depend on results of further testing and patient's response to treatment.   [facility]  [facility] Pager [phone]  If 7PM-7AM, please contact night-coverage [email or web address] Password [email or web address]  08/23/2014, 1:28 AM  Disclaimer: This note was dictated with voice recognition software. Similar sounding words can inadvertently be transcribed and may not be corrected upon review.

## 2014-08-23 NOTE — ED Provider Notes (Signed)
See prior note   Janice Norrie, MD 08/23/14 0100

## 2014-08-23 NOTE — Progress Notes (Addendum)
PROGRESS NOTE    Nancy [name] [id number] DOB: 21-Apr-1961 DOA: 08/22/2014 PCP: Minerva Ends, MD  HPI/Brief narrative 53 year old female patient with history of DM/IDDM, HTN, HLD, IBS presented with 2-3 month history of intermittent upper abdominal pain with nausea and vomiting which got worse in the last 4-5 days. Lately she saw small amount of blood in the emesis. No BM for 5-6 days prior to which were normal color. She takes Aleve daily for joint pains. She has lost 40 pounds weight over the last 1 year. Recently hospitalized for DKA 3 weeks ago. Colonoscopy in 2007-unknown results. CT abdomen 08/21/14 suggested duodenitis and groove pancreatitis > she was Rx then as acute viral GE and was supposed to have OP GES. GI was consulted.   Assessment/Plan:  1. Intractable nausea, vomiting & abdominal pain: DD-duodenitis, NSAID induced gastritis/PUD, esophagitis, groove pancreatitis/r/o malignancy, IBS flare & diabetic gastroparesis. Blood in emesis could be from any of above reasons or MW tear. Stable hemoglobin argues against significant GI bleed. NPO, IV fluids and IV PPI. GI consultation appreciated and will have colleague reassess in a.m. regarding further evaluation-EGD +/- EUS. Feels better. 2. Uncontrolled type II DM with ?? mild DKA: Treated with IV fluids and subcutaneous insulin. Her bicarbonate is better at 22 but still has AG 20-unclear etiology. Lactate 1.4. We'll continue aggressive IV fluid hydration and follow BMP in a.m. Do not believe she is in DKA. A1c 13.4 in August. At risk for hypoglycemia with 70/30 insulin, especially while NPO-DC same and will place on Lantus 25 units QHS and reduce NovoLog SSI to sensitive. 3. Uncontrolled hypertension: When necessary IV hydralazine. 4. Profound weight loss: May be related to problem #1. Await GI workup. Recent TSH normal. 5. History of hyperlipidemia:   Code Status: Full Family Communication: None at bedside Disposition  Plan: Home in medically stable   Consultants:  Gastroenterology  Procedures:  None  Antibiotics:  None   Subjective: Nonbloody emesis x1 since admission. Complains of epigastric pain-not relieved by Dilaudid-states morphine has worked better in the past.  Objective: Filed Vitals:   08/23/14 0540 08/23/14 1003 08/23/14 1058 08/23/14 1404  BP: 158/86 186/106 129/72 145/85  Pulse: 116 116 110 107  Temp: 98.2 F (36.8 C)   98.6 F (37 C)  TempSrc: Oral   Oral  Resp: 16 24 16 20   Height:      Weight:      SpO2: 100% 100% 99% 100%    Intake/Output Summary (Last 24 hours) at 08/23/14 1655 Last data filed at 08/23/14 1300  Gross per 24 hour  Intake 221.25 ml  Output      0 ml  Net 221.25 ml   Filed Weights   08/23/14 0225  Weight: 84 kg (185 lb 3 oz)     Exam:  General exam: Pleasant middle aged lying comfortably supine in bed. Respiratory system: Clear. No increased work of breathing. Cardiovascular system: S1 & S2 heard, RRR. No JVD, murmurs, gallops, clicks or pedal edema. Tele: ST 110's Gastrointestinal system: Abdomen is nondistended, soft. Mild epigastric tenderness on deep palpation without peritoneal signs. Normal bowel sounds heard. Central nervous system: Alert and oriented. No focal neurological deficits. Extremities: Symmetric 5 x 5 power.   Data Reviewed: Basic Metabolic Panel:  Recent Labs Lab 08/17/14 1641 08/21/14 1005 08/22/14 2145 08/23/14 0200 08/23/14 0612  NA 138 135* 137 137 138  K 5.2 4.4 3.7 4.0 3.5*  CL 99 94* 95* 96 96  CO2 33* 26  22 23 22   GLUCOSE 203* 337* 171* 216* 228*  BUN 10 12 15 17 17   CREATININE 0.73 0.73 0.66 0.67 0.65  CALCIUM 10.3 10.4 9.4 9.2 9.4   Liver Function Tests:  Recent Labs Lab 08/21/14 1005 08/22/14 2145 08/23/14 0612  AST 11 15 12   ALT 12 12 10   ALKPHOS 71 66 68  BILITOT 0.6 0.5 0.4  PROT 8.4* 7.9 7.9  ALBUMIN 3.8 3.7 3.5    Recent Labs Lab 08/21/14 1005 08/22/14 2145  LIPASE 35 22     No results found for this basename: AMMONIA,  in the last 168 hours CBC:  Recent Labs Lab 08/21/14 1005 08/22/14 2145 08/23/14 0612  WBC 9.8 11.5* 12.4*  NEUTROABS 7.3 9.1*  --   HGB 14.3 13.4 13.7  HCT 41.1 39.6 40.9  MCV 81.7 82.7 83.3  PLT 301 292 325   Cardiac Enzymes:  Recent Labs Lab 08/23/14 0200  TROPONINI <0.30   BNP (last 3 results) No results found for this basename: PROBNP,  in the last 8760 hours CBG:  Recent Labs Lab 08/23/14 0042 08/23/14 0324 08/23/14 0757 08/23/14 1133 08/23/14 1535  GLUCAP 192* 221* 169* 87 92    No results found for this or any previous visit (from the past 240 hour(s)).      Studies: Dg Chest 2 View  08/22/2014   CLINICAL DATA:  Nausea and emesis. Chest and abdomen pain. Weakness.  EXAM: CHEST  2 VIEW  COMPARISON:  07/1914  FINDINGS: The heart size and mediastinal contours are within normal limits. Both lungs are clear. The visualized skeletal structures are unremarkable.  IMPRESSION: No active cardiopulmonary disease.   Electronically Signed   By: Lucienne Capers M.D.   On: 08/22/2014 21:30        Scheduled Meds: . insulin aspart  0-15 Units Subcutaneous 6 times per day  . insulin aspart protamine- aspart  20 Units Subcutaneous BID WC  . pantoprazole (PROTONIX) IV  40 mg Intravenous Q12H  . sodium chloride  3 mL Intravenous Q12H   Continuous Infusions:   Principal Problem:   Duodenitis Active Problems:   Type II diabetes mellitus with neurological manifestations, uncontrolled   Hypertension associated with diabetes   Hyperlipidemia associated with type 2 diabetes mellitus   Obesity (BMI 30-39.9)    Time spent: 40 minutes.    Vernell Leep, MD, FACP, FHM. [facility] Pager [phone]  If 7PM-7AM, please contact night-coverage [email or web address] Password [email or web address] 08/23/2014, 4:55 PM    LOS: 1 day

## 2014-08-23 NOTE — ED Provider Notes (Signed)
Patient reports her diabetes has been out of control and her blood sugars were used in the 300s. She reports she's having left upper quadrant pain that radiates into her back that she describes as sharp. She has been having nausea and vomiting. She states she feels short of breath.  Patient appears uncomfortable, she is noted to have a dry tongue. She's tender in her left upper quadrant. Patient is having gagging during her exam.  Medical screening examination/treatment/procedure(s) were conducted as a shared visit with non-physician practitioner(s) and myself.  I personally evaluated the patient during the encounter.   EKG Interpretation None       Rolland Porter, MD, Abram Sander   Janice Norrie, MD 08/23/14 [phone]

## 2014-08-24 ENCOUNTER — Encounter (HOSPITAL_COMMUNITY): Payer: Self-pay | Admitting: Radiology

## 2014-08-24 ENCOUNTER — Inpatient Hospital Stay (HOSPITAL_COMMUNITY): Payer: Managed Care, Other (non HMO)

## 2014-08-24 DIAGNOSIS — E876 Hypokalemia: Secondary | ICD-10-CM

## 2014-08-24 DIAGNOSIS — E1149 Type 2 diabetes mellitus with other diabetic neurological complication: Secondary | ICD-10-CM

## 2014-08-24 DIAGNOSIS — I10 Essential (primary) hypertension: Secondary | ICD-10-CM

## 2014-08-24 LAB — BASIC METABOLIC PANEL
Anion gap: 17 — ABNORMAL HIGH (ref 5–15)
BUN: 11 mg/dL (ref 6–23)
CALCIUM: 9.5 mg/dL (ref 8.4–10.5)
CO2: 22 meq/L (ref 19–32)
CREATININE: 0.6 mg/dL (ref 0.50–1.10)
Chloride: 97 mEq/L (ref 96–112)
GFR calc Af Amer: >90 mL/min (ref >90)
GFR calc non Af Amer: >90 mL/min (ref >90)
Glucose, Bld: 239 mg/dL — ABNORMAL HIGH (ref 70–99)
Potassium: 3.4 mEq/L — ABNORMAL LOW (ref 3.7–5.3)
Sodium: 136 mEq/L — ABNORMAL LOW (ref 137–147)

## 2014-08-24 LAB — GLUCOSE, CAPILLARY
GLUCOSE-CAPILLARY: 150 mg/dL — AB (ref 70–99)
GLUCOSE-CAPILLARY: 210 mg/dL — AB (ref 70–99)
Glucose-Capillary: 156 mg/dL — ABNORMAL HIGH (ref 70–99)
Glucose-Capillary: 163 mg/dL — ABNORMAL HIGH (ref 70–99)
Glucose-Capillary: 184 mg/dL — ABNORMAL HIGH (ref 70–99)
Glucose-Capillary: 210 mg/dL — ABNORMAL HIGH (ref 70–99)
Glucose-Capillary: 217 mg/dL — ABNORMAL HIGH (ref 70–99)
Glucose-Capillary: 235 mg/dL — ABNORMAL HIGH (ref 70–99)
Glucose-Capillary: 78 mg/dL (ref 70–99)

## 2014-08-24 LAB — CBC
HCT: 42.6 % (ref 36.0–46.0)
Hemoglobin: 14.5 g/dL (ref 12.0–15.0)
MCH: 28.5 pg (ref 26.0–34.0)
MCHC: 34 g/dL (ref 30.0–36.0)
MCV: 83.7 fL (ref 78.0–100.0)
Platelets: 359 10*3/uL (ref 150–400)
RBC: 5.09 MIL/uL (ref 3.87–5.11)
RDW: 12.5 % (ref 11.5–15.5)
WBC: 14.1 10*3/uL — ABNORMAL HIGH (ref 4.0–10.5)

## 2014-08-24 LAB — LIPASE, BLOOD: LIPASE: 17 U/L (ref 11–59)

## 2014-08-24 LAB — MAGNESIUM: Magnesium: 2 mg/dL (ref 1.5–2.5)

## 2014-08-24 MED ORDER — INSULIN GLARGINE 100 UNIT/ML ~~LOC~~ SOLN
25.0000 [IU] | Freq: Every day | SUBCUTANEOUS | Status: DC
  Administered 2014-08-24 – 2014-08-25 (×2): 25 [IU] via SUBCUTANEOUS
  Filled 2014-08-24 (×3): qty 0.25

## 2014-08-24 MED ORDER — POTASSIUM CHLORIDE CRYS ER 20 MEQ PO TBCR
40.0000 meq | EXTENDED_RELEASE_TABLET | Freq: Once | ORAL | Status: AC
  Administered 2014-08-24: 40 meq via ORAL
  Filled 2014-08-24: qty 2

## 2014-08-24 MED ORDER — SODIUM CHLORIDE 0.9 % IV SOLN
INTRAVENOUS | Status: DC
  Administered 2014-08-25: 05:00:00 via INTRAVENOUS

## 2014-08-24 MED ORDER — POTASSIUM CL IN DEXTROSE 5% 20 MEQ/L IV SOLN
20.0000 meq | INTRAVENOUS | Status: AC
  Administered 2014-08-24 (×2): 20 meq via INTRAVENOUS
  Filled 2014-08-24 (×2): qty 1000

## 2014-08-24 MED ORDER — IOHEXOL 300 MG/ML  SOLN: 25.0000 mL | INTRAMUSCULAR | Status: AC

## 2014-08-24 MED ORDER — PROMETHAZINE HCL 25 MG/ML IJ SOLN
25.0000 mg | Freq: Three times a day (TID) | INTRAMUSCULAR | Status: DC | PRN
  Administered 2014-08-24 – 2014-08-25 (×4): 25 mg via INTRAVENOUS
  Filled 2014-08-24 (×6): qty 1

## 2014-08-24 MED ORDER — IOHEXOL 300 MG/ML  SOLN
100.0000 mL | Freq: Once | INTRAMUSCULAR | Status: AC | PRN
  Administered 2014-08-24: 100 mL via INTRAVENOUS

## 2014-08-24 MED ORDER — IOHEXOL 300 MG/ML  SOLN
25.0000 mL | INTRAMUSCULAR | Status: AC
  Administered 2014-08-24 (×2): 25 mL via ORAL

## 2014-08-24 MED ORDER — DEXTROSE 50 % IV SOLN
25.0000 mL | Freq: Once | INTRAVENOUS | Status: AC
Start: 2014-08-24 — End: 2014-08-24
  Administered 2014-08-24: 25 mL via INTRAVENOUS
  Filled 2014-08-24: qty 50

## 2014-08-24 NOTE — Anesthesia Preprocedure Evaluation (Addendum)
Anesthesia Evaluation  Patient identified by MRN, date of birth, ID band Patient awake    Reviewed: Allergy & Precautions, H&P , NPO status , Patient's Chart, lab work & pertinent test results  Airway Mallampati: II TM Distance: >3 FB Neck ROM: Full    Dental no notable dental hx.    Pulmonary neg pulmonary ROS,  breath sounds clear to auscultation  Pulmonary exam normal       Cardiovascular hypertension, Pt. on medications negative cardio ROS  Rhythm:Regular Rate:Normal     Neuro/Psych PSYCHIATRIC DISORDERS Anxiety Depression negative neurological ROS     GI/Hepatic Neg liver ROS, IBS   Endo/Other  diabetes, Poorly Controlled, Type 1, Insulin DependentHypothyroidism   Renal/GU negative Renal ROS  negative genitourinary   Musculoskeletal negative musculoskeletal ROS (+)   Abdominal   Peds negative pediatric ROS (+)  Hematology negative hematology ROS (+) anemia ,   Anesthesia Other Findings Patient appears 10x better than yesterday. She denies retching and that her nausea is much improved. She denies headache, vomiting, blurry vision. Blood pressure well controlled overnight.  Reproductive/Obstetrics negative OB ROS                         Anesthesia Physical Anesthesia Plan  ASA: III  Anesthesia Plan: MAC   Post-op Pain Management:    Induction: Intravenous  Airway Management Planned: Nasal Cannula  Additional Equipment:   Intra-op Plan:   Post-operative Plan:   Informed Consent: I have reviewed the patients History and Physical, chart, labs and discussed the procedure including the risks, benefits and alternatives for the proposed anesthesia with the patient or authorized representative who has indicated his/her understanding and acceptance.   Dental advisory given  Plan Discussed with: CRNA and Surgeon  Anesthesia Plan Comments: (Case cancelled yesterday: Patient was  persistently hypertensive with systolics in the 0000000 despite hydralazine and labatelol. She denies headache, blurry vision, LOC but was actively retching. She has been nauseous since her admission. Discussed with Dr. Paulita Fujita who agreed to cancel the case and will discuss blood pressure control with her primary team.  Case rescheduled for today: Blood pressure well controlled overnight on labetalol and hydralazine. )      Anesthesia Quick Evaluation

## 2014-08-24 NOTE — Progress Notes (Addendum)
PROGRESS NOTE    Nancy [name] [id number] DOB: March 02, 1961 DOA: 08/22/2014 PCP: Minerva Ends, MD  HPI/Brief narrative 53 year old female patient with history of DM/IDDM, HTN, HLD, IBS presented with 2-3 month history of intermittent upper abdominal pain with nausea and vomiting which got worse in the last 4-5 days. Lately she saw small amount of blood in the emesis. No BM for 5-6 days prior to which were normal color. She takes Aleve daily for joint pains. She has lost 40 pounds weight over the last 1 year. Recently hospitalized for DKA 3 weeks ago. Colonoscopy in 2007-unknown results. CT abdomen 08/21/14 suggested duodenitis and groove pancreatitis > she was Rx then as acute viral GE and was supposed to have OP GES. GI was consulted.   Assessment/Plan:  1. Intractable nausea, vomiting & abdominal pain: DD-duodenitis, NSAID induced gastritis/PUD, esophagitis, groove pancreatitis/r/o malignancy, IBS flare & diabetic gastroparesis. Blood in emesis could be from any of above reasons or MW tear. Stable hemoglobin argues against significant GI bleed. NPO, IV fluids and IV PPI. GI followup appreciated. CT abdomen and pelvis with contrast 9/8 without acute findings and no pancreatitis identified.? EGD 9/9. 2. Uncontrolled type II DM with ?? mild DKA: Treated with IV fluids and subcutaneous insulin. Lactate 1.4. Do not believe she was in DKA. A1c 13.4 in August. At risk for hypoglycemia with 70/30 insulin, especially while NPO-DC'ed same and placed on Lantus 25 units QHS and reduce NovoLog SSI to sensitive-however did not get last night this dose of Lantus due to CBG in the 70s and concern for hypoglycemia. Resume Lantus today and monitor. 3. Uncontrolled hypertension: When necessary IV hydralazine. 4. Profound weight loss: May be related to problem #1. Await GI workup. Recent TSH normal. 5. History of hyperlipidemia: 6. Hypokalemia: Replace and follow.   Code Status: Full Family  Communication: None at bedside Disposition Plan: Home when medically stable   Consultants:  Gastroenterology  Procedures:  None  Antibiotics:  None   Subjective: Nausea but no vomiting. States that she has 7/10 abdominal pain, however does not look in pain. Feels like eating.  Objective: Filed Vitals:   08/23/14 2009 08/24/14 0514 08/24/14 0646 08/24/14 1440  BP: 139/76 196/100 196/100 163/95  Pulse: 99 95  109  Temp: 98.1 F (36.7 C) 98.3 F (36.8 C)  99.4 F (37.4 C)  TempSrc: Oral Oral  Oral  Resp: 18 18    Height:      Weight:      SpO2: 100% 100%  100%    Intake/Output Summary (Last 24 hours) at 08/24/14 1601 Last data filed at 08/24/14 0700  Gross per 24 hour  Intake 1035.42 ml  Output      2 ml  Net 1033.42 ml   Filed Weights   08/23/14 0225  Weight: 84 kg (185 lb 3 oz)     Exam:  General exam: Pleasant middle aged lying comfortably supine in bed. Respiratory system: Clear. No increased work of breathing. Cardiovascular system: S1 & S2 heard, RRR. No JVD, murmurs, gallops, clicks or pedal edema. Tele: ST 120's- DC telemetry on 9/8 Gastrointestinal system: Abdomen is nondistended, soft and nontender. Normal bowel sounds heard. Central nervous system: Alert and oriented. No focal neurological deficits. Extremities: Symmetric 5 x 5 power.   Data Reviewed: Basic Metabolic Panel:  Recent Labs Lab 08/21/14 1005 08/22/14 2145 08/23/14 0200 08/23/14 0612 08/24/14 0700  NA 135* 137 137 138 136*  K 4.4 3.7 4.0 3.5* 3.4*  CL 94*  95* 96 96 97  CO2 26 22 23 22 22   GLUCOSE 337* 171* 216* 228* 239*  BUN 12 15 17 17 11   CREATININE 0.73 0.66 0.67 0.65 0.60  CALCIUM 10.4 9.4 9.2 9.4 9.5   Liver Function Tests:  Recent Labs Lab 08/21/14 1005 08/22/14 2145 08/23/14 0612  AST 11 15 12   ALT 12 12 10   ALKPHOS 71 66 68  BILITOT 0.6 0.5 0.4  PROT 8.4* 7.9 7.9  ALBUMIN 3.8 3.7 3.5    Recent Labs Lab 08/21/14 1005 08/22/14 2145 08/24/14 0700   LIPASE 35 22 17   No results found for this basename: AMMONIA,  in the last 168 hours CBC:  Recent Labs Lab 08/21/14 1005 08/22/14 2145 08/23/14 0612 08/24/14 0700  WBC 9.8 11.5* 12.4* 14.1*  NEUTROABS 7.3 9.1*  --   --   HGB 14.3 13.4 13.7 14.5  HCT 41.1 39.6 40.9 42.6  MCV 81.7 82.7 83.3 83.7  PLT 301 292 325 359   Cardiac Enzymes:  Recent Labs Lab 08/23/14 0200  TROPONINI <0.30   BNP (last 3 results) No results found for this basename: PROBNP,  in the last 8760 hours CBG:  Recent Labs Lab 08/24/14 0003 08/24/14 0207 08/24/14 0426 08/24/14 0735 08/24/14 1135  GLUCAP 78 150* 156* 210* 217*    No results found for this or any previous visit (from the past 240 hour(s)).      Studies: Dg Chest 2 View  08/22/2014   CLINICAL DATA:  Nausea and emesis. Chest and abdomen pain. Weakness.  EXAM: CHEST  2 VIEW  COMPARISON:  07/1914  FINDINGS: The heart size and mediastinal contours are within normal limits. Both lungs are clear. The visualized skeletal structures are unremarkable.  IMPRESSION: No active cardiopulmonary disease.   Electronically Signed   By: Lucienne Capers M.D.   On: 08/22/2014 21:30   Ct Abdomen Pelvis W Contrast  08/24/2014   CLINICAL DATA:  Diffuse abdominal pain, pancreatitis, leukocytosis, anemia, history diabetes, hypertension, hyperlipidemia, irritable bowel syndrome  EXAM: CT ABDOMEN AND PELVIS WITH CONTRAST  TECHNIQUE: Multidetector CT imaging of the abdomen and pelvis was performed using the standard protocol following bolus administration of intravenous contrast. Sagittal and coronal MPR images reconstructed from axial data set.  CONTRAST:  166mL OMNIPAQUE IOHEXOL 300 MG/ML SOLN IV. Dilute oral contrast.  COMPARISON:  08/21/2014  FINDINGS: Lung bases clear.  Single tiny calcification within RIGHT adrenal gland unchanged.  Single calcified granuloma within spleen.  Liver, spleen, pancreas, kidneys, and adrenal glands otherwise normal appearance.   Specifically, no definite CT features of acute pancreatitis.  Unremarkable bladder, ovaries and ureters with surgical absence of uterus.  Cecal wall minimally prominent but the cecum is incompletely distended, question artifact.  Stomach and bowel loops otherwise normal appearance.  Normal appendix.  No mass, adenopathy, free fluid or inflammatory process.  No acute osseous findings.  IMPRESSION: No acute intra-abdominal or intrapelvic abnormalities.  Specifically no CT features of pancreatitis identified.   Electronically Signed   By: Lavonia Dana M.D.   On: 08/24/2014 15:11        Scheduled Meds: . insulin aspart  0-9 Units Subcutaneous 6 times per day  . insulin glargine  25 Units Subcutaneous Daily  . pantoprazole (PROTONIX) IV  40 mg Intravenous Q12H  . sodium chloride  3 mL Intravenous Q12H   Continuous Infusions: . dextrose 5 % with KCl 20 mEq / L 20 mEq (08/24/14 1446)    Principal Problem:   Duodenitis  Active Problems:   Type II diabetes mellitus with neurological manifestations, uncontrolled   Hypertension associated with diabetes   Hyperlipidemia associated with type 2 diabetes mellitus   Obesity (BMI 30-39.9)    Time spent: 40 minutes.    Vernell Leep, MD, FACP, FHM. [facility] Pager [phone]  If 7PM-7AM, please contact night-coverage [email or web address] Password [email or web address] 08/24/2014, 4:01 PM    LOS: 2 days

## 2014-08-24 NOTE — Progress Notes (Signed)
Subjective: Abdominal pain not improved. Still having significant abdominal pain and vomiting.  Objective: Vital signs in last 24 hours: Temp:  [98.1 F (36.7 C)-98.6 F (37 C)] 98.3 F (36.8 C) (09/08 0514) Pulse Rate:  [95-116] 95 (09/08 0514) Resp:  [16-24] 18 (09/08 0514) BP: (129-196)/(72-106) 196/100 mmHg (09/08 0646) SpO2:  [99 %-100 %] 100 % (09/08 0514) Weight change:  Last BM Date: 08/17/14  PE: GEN:  Uncomfortable, but not acutely toxic-appearing ABD:  Epigastric tenderness with voluntary guarding, hypoactive bowel sounds, no rebound or rigidity  Lab Results: CBC    Component Value Date/Time   WBC 14.1* 08/24/2014 0700   WBC 12.2* 07/08/2008 1551   RBC 5.09 08/24/2014 0700   RBC 4.41 07/08/2008 1551   HGB 14.5 08/24/2014 0700   HGB 12.5 07/08/2008 1551   HCT 42.6 08/24/2014 0700   HCT 37.3 07/08/2008 1551   PLT 359 08/24/2014 0700   PLT 294 07/08/2008 1551   MCV 83.7 08/24/2014 0700   MCV 84.7 07/08/2008 1551   MCH 28.5 08/24/2014 0700   MCH 28.4 07/08/2008 1551   MCHC 34.0 08/24/2014 0700   MCHC 33.5 07/08/2008 1551   RDW 12.5 08/24/2014 0700   RDW 13.0 07/08/2008 1551   LYMPHSABS 2.0 08/22/2014 2145   LYMPHSABS 4.6* 07/08/2008 1551   MONOABS 0.3 08/22/2014 2145   MONOABS 0.6 07/08/2008 1551   EOSABS 0.0 08/22/2014 2145   EOSABS 0.6* 07/08/2008 1551   BASOSABS 0.1 08/22/2014 2145   BASOSABS 0.1 07/08/2008 1551   CMP     Component Value Date/Time   NA 136* 08/24/2014 0700   K 3.4* 08/24/2014 0700   CL 97 08/24/2014 0700   CO2 22 08/24/2014 0700   GLUCOSE 239* 08/24/2014 0700   BUN 11 08/24/2014 0700   CREATININE 0.60 08/24/2014 0700   CREATININE 0.73 08/17/2014 1641   CALCIUM 9.5 08/24/2014 0700   PROT 7.9 08/23/2014 0612   ALBUMIN 3.5 08/23/2014 0612   AST 12 08/23/2014 0612   ALT 10 08/23/2014 0612   ALKPHOS 68 08/23/2014 0612   BILITOT 0.4 08/23/2014 0612   GFRNONAA >90 08/24/2014 0700   GFRAA >90 08/24/2014 0700   Assessment:  1.  Abdominal pain, nausea, vomiting.  Not improved with medical  management. 2.  CT scan with ? Groove pancreatitis; lipase levels normal.  Plan:  1.  Given persistent, and seemingly (by my exam) worsening abdominal pain, and given possible history of pancreatitis (though lipase levels normal), will repeat CT scan today to rule out evolution of pancreatitis (abscess, necrosis, etc) and to rule out evolving abdominal process (worsening small bowel thickening, micro- versus macroperforation, etc.). 2.  If the CT scan doesn't show any significant change, would then do endoscopy in the next day or two. 3.  Repeat lipase today. 4.  Sips ice chips for now; if CT scan looks ok, then could consider clear liquid diet later today. 5.  Will follow.   Landry Dyke 08/24/2014, 9:01 AM

## 2014-08-25 ENCOUNTER — Encounter (HOSPITAL_COMMUNITY): Payer: Self-pay | Admitting: *Deleted

## 2014-08-25 DIAGNOSIS — R7309 Other abnormal glucose: Secondary | ICD-10-CM

## 2014-08-25 DIAGNOSIS — I10 Essential (primary) hypertension: Secondary | ICD-10-CM | POA: Diagnosis present

## 2014-08-25 LAB — GLUCOSE, CAPILLARY
GLUCOSE-CAPILLARY: 209 mg/dL — AB (ref 70–99)
GLUCOSE-CAPILLARY: 171 mg/dL — AB (ref 70–99)
Glucose-Capillary: 199 mg/dL — ABNORMAL HIGH (ref 70–99)
Glucose-Capillary: 186 mg/dL — ABNORMAL HIGH (ref 70–99)
Glucose-Capillary: 173 mg/dL — ABNORMAL HIGH (ref 70–99)

## 2014-08-25 LAB — BASIC METABOLIC PANEL
Anion gap: 14 (ref 5–15)
BUN: 8 mg/dL (ref 6–23)
CHLORIDE: 96 meq/L (ref 96–112)
CO2: 23 mEq/L (ref 19–32)
Calcium: 9.3 mg/dL (ref 8.4–10.5)
Creatinine, Ser: 0.61 mg/dL (ref 0.50–1.10)
GFR calc Af Amer: >90 mL/min (ref >90)
GFR calc non Af Amer: >90 mL/min (ref >90)
GLUCOSE: 218 mg/dL — AB (ref 70–99)
POTASSIUM: 3.7 meq/L (ref 3.7–5.3)
Sodium: 133 mEq/L — ABNORMAL LOW (ref 137–147)

## 2014-08-25 MED ORDER — LABETALOL HCL 5 MG/ML IV SOLN
5.0000 mg | Freq: Once | INTRAVENOUS | Status: AC
  Administered 2014-08-25: 5 mg via INTRAVENOUS

## 2014-08-25 MED ORDER — HYDRALAZINE HCL 20 MG/ML IJ SOLN
INTRAMUSCULAR | Status: AC
  Filled 2014-08-25: qty 1

## 2014-08-25 MED ORDER — HYDRALAZINE HCL 20 MG/ML IJ SOLN: 20.0000 mg | Freq: Four times a day (QID) | INTRAMUSCULAR | Status: DC | PRN

## 2014-08-25 MED ORDER — PROPOFOL 10 MG/ML IV BOLUS
INTRAVENOUS | Status: AC
  Filled 2014-08-25: qty 20

## 2014-08-25 MED ORDER — LABETALOL HCL 5 MG/ML IV SOLN
10.0000 mg | Freq: Two times a day (BID) | INTRAVENOUS | Status: DC
  Administered 2014-08-25 – 2014-08-26 (×3): 10 mg via INTRAVENOUS
  Filled 2014-08-25 (×5): qty 4

## 2014-08-25 MED ORDER — HYDRALAZINE HCL 20 MG/ML IJ SOLN
20.0000 mg | Freq: Three times a day (TID) | INTRAMUSCULAR | Status: DC
  Administered 2014-08-25 – 2014-08-26 (×3): 20 mg via INTRAVENOUS
  Filled 2014-08-25 (×6): qty 1

## 2014-08-25 MED ORDER — LABETALOL HCL 5 MG/ML IV SOLN
INTRAVENOUS | Status: AC
  Filled 2014-08-25: qty 4

## 2014-08-25 NOTE — Progress Notes (Signed)
Subjective: Abdominal pain, nausea, vomiting persists.  Objective: Vital signs in last 24 hours: Temp:  [97.8 F (36.6 C)-99.4 F (37.4 C)] 97.8 F (36.6 C) (09/09 0924) Pulse Rate:  [101-115] 115 (09/09 0532) Resp:  [13-20] 13 (09/09 0924) BP: (148-263)/(84-133) 263/130 mmHg (09/09 0937) SpO2:  [99 %-100 %] 99 % (09/09 0924) Weight:  [83.915 kg (185 lb)] 83.915 kg (185 lb) (09/09 0924) Weight change:  Last BM Date: 08/17/14  PE: GEN:  NAD ABD:  Mild epigastric tenderness, hypoactive but present bowel sounds  Lab Results: Results for orders placed during the hospital encounter of 08/22/14 (from the past 48 hour(s))  GLUCOSE, CAPILLARY     Status: None   Collection Time    08/23/14 11:33 AM      Result Value Ref Range   Glucose-Capillary 87  70 - 99 mg/dL  GLUCOSE, CAPILLARY     Status: None   Collection Time    08/23/14  3:35 PM      Result Value Ref Range   Glucose-Capillary 92  70 - 99 mg/dL  GLUCOSE, CAPILLARY     Status: None   Collection Time    08/23/14  8:11 PM      Result Value Ref Range   Glucose-Capillary 87  70 - 99 mg/dL   Comment 1 Documented in Chart     Comment 2 Notify RN    GLUCOSE, CAPILLARY     Status: None   Collection Time    08/24/14 12:03 AM      Result Value Ref Range   Glucose-Capillary 78  70 - 99 mg/dL   Comment 1 Documented in Chart     Comment 2 Notify RN    GLUCOSE, CAPILLARY     Status: Abnormal   Collection Time    08/24/14  2:07 AM      Result Value Ref Range   Glucose-Capillary 150 (*) 70 - 99 mg/dL  GLUCOSE, CAPILLARY     Status: Abnormal   Collection Time    08/24/14  4:26 AM      Result Value Ref Range   Glucose-Capillary 156 (*) 70 - 99 mg/dL  BASIC METABOLIC PANEL     Status: Abnormal   Collection Time    08/24/14  7:00 AM      Result Value Ref Range   Sodium 136 (*) 137 - 147 mEq/L   Potassium 3.4 (*) 3.7 - 5.3 mEq/L   Chloride 97  96 - 112 mEq/L   CO2 22  19 - 32 mEq/L   Glucose, Bld 239 (*) 70 - 99 mg/dL   BUN  11  6 - 23 mg/dL   Creatinine, Ser 0.60  0.50 - 1.10 mg/dL   Calcium 9.5  8.4 - 10.5 mg/dL   GFR calc non Af Amer >90  >90 mL/min   GFR calc Af Amer >90  >90 mL/min   Comment: (NOTE)     The eGFR has been calculated using the CKD EPI equation.     This calculation has not been validated in all clinical situations.     eGFR's persistently <90 mL/min signify possible Chronic Kidney     Disease.   Anion gap 17 (*) 5 - 15  CBC     Status: Abnormal   Collection Time    08/24/14  7:00 AM      Result Value Ref Range   WBC 14.1 (*) 4.0 - 10.5 K/uL   RBC 5.09  3.87 - 5.11 MIL/uL  Hemoglobin 14.5  12.0 - 15.0 g/dL   HCT 42.6  36.0 - 46.0 %   MCV 83.7  78.0 - 100.0 fL   MCH 28.5  26.0 - 34.0 pg   MCHC 34.0  30.0 - 36.0 g/dL   RDW 12.5  11.5 - 15.5 %   Platelets 359  150 - 400 K/uL  LIPASE, BLOOD     Status: None   Collection Time    08/24/14  7:00 AM      Result Value Ref Range   Lipase 17  11 - 59 U/L  GLUCOSE, CAPILLARY     Status: Abnormal   Collection Time    08/24/14  7:35 AM      Result Value Ref Range   Glucose-Capillary 210 (*) 70 - 99 mg/dL  GLUCOSE, CAPILLARY     Status: Abnormal   Collection Time    08/24/14 11:35 AM      Result Value Ref Range   Glucose-Capillary 217 (*) 70 - 99 mg/dL  GLUCOSE, CAPILLARY     Status: Abnormal   Collection Time    08/24/14  4:09 PM      Result Value Ref Range   Glucose-Capillary 184 (*) 70 - 99 mg/dL  MAGNESIUM     Status: None   Collection Time    08/24/14  4:30 PM      Result Value Ref Range   Magnesium 2.0  1.5 - 2.5 mg/dL  GLUCOSE, CAPILLARY     Status: Abnormal   Collection Time    08/24/14  8:32 PM      Result Value Ref Range   Glucose-Capillary 210 (*) 70 - 99 mg/dL  GLUCOSE, CAPILLARY     Status: Abnormal   Collection Time    08/24/14 11:51 PM      Result Value Ref Range   Glucose-Capillary 235 (*) 70 - 99 mg/dL  BASIC METABOLIC PANEL     Status: Abnormal   Collection Time    08/25/14  4:20 AM      Result Value  Ref Range   Sodium 133 (*) 137 - 147 mEq/L   Potassium 3.7  3.7 - 5.3 mEq/L   Chloride 96  96 - 112 mEq/L   CO2 23  19 - 32 mEq/L   Glucose, Bld 218 (*) 70 - 99 mg/dL   BUN 8  6 - 23 mg/dL   Creatinine, Ser 0.61  0.50 - 1.10 mg/dL   Calcium 9.3  8.4 - 10.5 mg/dL   GFR calc non Af Amer >90  >90 mL/min   GFR calc Af Amer >90  >90 mL/min   Comment: (NOTE)     The eGFR has been calculated using the CKD EPI equation.     This calculation has not been validated in all clinical situations.     eGFR's persistently <90 mL/min signify possible Chronic Kidney     Disease.   Anion gap 14  5 - 15  GLUCOSE, CAPILLARY     Status: Abnormal   Collection Time    08/25/14  4:38 AM      Result Value Ref Range   Glucose-Capillary 199 (*) 70 - 99 mg/dL  GLUCOSE, CAPILLARY     Status: Abnormal   Collection Time    08/25/14  7:44 AM      Result Value Ref Range   Glucose-Capillary 186 (*) 70 - 99 mg/dL   Comment 1 Documented in Chart     Comment 2 Notify RN  Studies/Results: Ct Abdomen Pelvis W Contrast  08/24/2014   CLINICAL DATA:  Diffuse abdominal pain, pancreatitis, leukocytosis, anemia, history diabetes, hypertension, hyperlipidemia, irritable bowel syndrome  EXAM: CT ABDOMEN AND PELVIS WITH CONTRAST  TECHNIQUE: Multidetector CT imaging of the abdomen and pelvis was performed using the standard protocol following bolus administration of intravenous contrast. Sagittal and coronal MPR images reconstructed from axial data set.  CONTRAST:  111mL OMNIPAQUE IOHEXOL 300 MG/ML SOLN IV. Dilute oral contrast.  COMPARISON:  08/21/2014  FINDINGS: Lung bases clear.  Single tiny calcification within RIGHT adrenal gland unchanged.  Single calcified granuloma within spleen.  Liver, spleen, pancreas, kidneys, and adrenal glands otherwise normal appearance.  Specifically, no definite CT features of acute pancreatitis.  Unremarkable bladder, ovaries and ureters with surgical absence of uterus.  Cecal wall minimally  prominent but the cecum is incompletely distended, question artifact.  Stomach and bowel loops otherwise normal appearance.  Normal appendix.  No mass, adenopathy, free fluid or inflammatory process.  No acute osseous findings.  IMPRESSION: No acute intra-abdominal or intrapelvic abnormalities.  Specifically no CT features of pancreatitis identified.   Electronically Signed   By: Lavonia Dana M.D.   On: 08/24/2014 15:11    Assessment:  1.  Abdominal pain. 2.  ? Pancreatitis.  Normal recent lipase and CT yesterday.  Doubt pancreatitis, doubt she ever had pancreatitis. 3.  Poorly controlled hypertension.  Plan:  1.  Anesthesia has cancelled EGD for today, due to patient's significant hypertension. 2.  Will revisit tomorrow; if blood pressure is under better control, we could try to reschedule endoscopy sometime in the next day or two. 3.  Clear liquid diet ok for now, advance slowly when/if patient tolerates. 4.  Continue PPI and medical therapy.   Landry Dyke 08/25/2014, 10:38 AM

## 2014-08-25 NOTE — OR Nursing (Signed)
BP 222/110.  Dr. Jillyn Hidden ordered Labetalol 5 mg IV once.  Given.  Will continue to monitor. GI MD cancelled procedure related to elevated BP.

## 2014-08-25 NOTE — Progress Notes (Signed)
Inpatient Diabetes Program Recommendations  AACE/ADA: New Consensus Statement on Inpatient Glycemic Control (2013)  Target Ranges:  Prepandial:   less than 140 mg/dL      Peak postprandial:   less than 180 mg/dL (1-2 hours)      Critically ill patients:  140 - 180 mg/dL     Results for Nancy [name], Nancy [name] (MRN [id number]) as of 08/25/2014 12:55  Ref. Range 08/24/2014 00:03 08/24/2014 02:07 08/24/2014 04:26 08/24/2014 07:35 08/24/2014 11:35 08/24/2014 16:09 08/24/2014 20:32  Glucose-Capillary Latest Range: 70-99 mg/dL 78 150 (H) 156 (H) 210 (H) 217 (H) 184 (H) 210 (H)    Results for Nancy [name], Nancy [name] (MRN [id number]) as of 08/25/2014 12:55  Ref. Range 08/24/2014 23:51 08/25/2014 04:38 08/25/2014 07:44 08/25/2014 12:01  Glucose-Capillary Latest Range: 70-99 mg/dL 235 (H) 199 (H) 186 (H) 209 (H)     Current Orders: Lantus 25 units daily Novolog Sensitive SSI Q4 hours    Patient NPO for Endoscopy today.  Having some glucose elevations >200 mg/dl.    MD- If patient continues to have glucose elevations, please consider the following:  1. Increase Lantus to 30 units daily 2. Increase SSI to Moderate scale    Will follow Wyn Quaker RN, MSN, CDE Diabetes Coordinator Inpatient Diabetes Program Team Pager: [phone] (8a-10p)

## 2014-08-25 NOTE — OR Nursing (Signed)
BP 209/104. Then 201/98.  Report called to Manuela Schwartz. Pt transported to floor via stretcher.

## 2014-08-25 NOTE — Progress Notes (Signed)
[facility] PROGRESS NOTE  Nancy [name] [id number] DOB: [birth year] DOA: 08/22/2014 PCP: Minerva Ends, MD Brief narrative 53 year old female with history of insulin-dependent diabetes mellitus, hypertension, hyperlipidemia, irritable bowel syndrome is indicative 2-3 months out off-and-on upper abdominal pain with nausea and vomiting which has worsened over the past 4-5 days. She'll also reported small amounts of bloody emesis. Patient has been taking NSAIDs daily for joint pains and has also lost 40 pounds over the past one year. 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 GI consulted.   Assessment/Plan: Abdominal pain with intractable nausea and vomiting Differential includes acute duodenitis, diabetes gastroparesis, gastritis/peptic ulcer disease versus groove pancreatitis. -Hemoglobin stable. Continue n.p.o. with supportive care with IV fluids and IV PPI. Repeat CT abdomen and pelvis with contrast on 9/8 without any acute findings seen on admission. No signs of pancreatitis. -EGD planned for today but was unable to given uncontrolled hypertension and rescheduled for tomorrow.  Accelerated hypertension Will place on scheduled hydralazine and labetalol.  Uncontrolled type 2 diabetes mellitus Last A1c of 13.4. Patient on 70/30 and switch to Lantus given risk for hypoglycemia while she  is n.p.o. Lantus increased to 30 units daily given elevated FSG. Monitor with sliding scale insulin.  Weight loss Possibly to primary problem. Follow with EGD  Hypokalemia Replenished  Diet: N.p.o.  DVT prophylaxis: Subcutaneous Lovenox  Code Status: Full code Family Communication: None at bedside Disposition Plan: home pending EGD   Consultants:  [facility]  Procedures:  For EGD in a.m.  Antibiotics:  None  HPI/Subjective: Patient seen and examined this morning. Patient taken for  EGD but was aborted as blood pressure was uncontrolled. Denies  any nausea or vomiting. Denies abdominal pain  Objective: Filed Vitals:   08/25/14 1411  BP: 181/93  Pulse: 96  Temp: 98.1 F (36.7 C)  Resp: 15    Intake/Output Summary (Last 24 hours) at 08/25/14 1623 Last data filed at 08/25/14 1411  Gross per 24 hour  Intake    207 ml  Output   1600 ml  Net  -1393 ml   Filed Weights   08/23/14 0225 08/25/14 0924  Weight: 84 kg (185 lb 3 oz) 83.915 kg (185 lb)    Exam:   General: Middle aged obese female in no acute distress  HEENT: No pallor, moist oral mucosa  Cardiovascular: Normal S1 and S2, no murmurs  Respiratory: Clear bilaterally, no added sounds  Abdomen: Soft, nondistended, nontender, bowel sounds present  Musculoskeletal: Warm, no edema  CNS: Alert and oriented  Data Reviewed: Basic Metabolic Panel:  Recent Labs Lab 08/22/14 2145 08/23/14 0200 08/23/14 0612 08/24/14 0700 08/24/14 1630 08/25/14 0420  NA 137 137 138 136*  --  133*  K 3.7 4.0 3.5* 3.4*  --  3.7  CL 95* 96 96 97  --  96  CO2 22 23 22 22   --  23  GLUCOSE 171* 216* 228* 239*  --  218*  BUN 15 17 17 11   --  8  CREATININE 0.66 0.67 0.65 0.60  --  0.61  CALCIUM 9.4 9.2 9.4 9.5  --  9.3  MG  --   --   --   --  2.0  --    Liver Function Tests:  Recent Labs Lab 08/21/14 1005 08/22/14 2145 08/23/14 0612  AST 11 15 12   ALT 12 12 10   ALKPHOS 71 66 68  BILITOT 0.6 0.5 0.4  PROT 8.4* 7.9 7.9  ALBUMIN 3.8  3.7 3.5    Recent Labs Lab 08/21/14 1005 08/22/14 2145 08/24/14 0700  LIPASE 35 22 17   No results found for this basename: AMMONIA,  in the last 168 hours CBC:  Recent Labs Lab 08/21/14 1005 08/22/14 2145 08/23/14 0612 08/24/14 0700  WBC 9.8 11.5* 12.4* 14.1*  NEUTROABS 7.3 9.1*  --   --   HGB 14.3 13.4 13.7 14.5  HCT 41.1 39.6 40.9 42.6  MCV 81.7 82.7 83.3 83.7  PLT 301 292 325 359   Cardiac Enzymes:  Recent Labs Lab 08/23/14 0200  TROPONINI <0.30   BNP (last 3 results) No results found for this basename:  PROBNP,  in the last 8760 hours CBG:  Recent Labs Lab 08/24/14 2032 08/24/14 2351 08/25/14 0438 08/25/14 0744 08/25/14 1201  GLUCAP 210* 235* 199* 186* 209*    No results found for this or any previous visit (from the past 240 hour(s)).   Studies: Ct Abdomen Pelvis W Contrast  08/24/2014   CLINICAL DATA:  Diffuse abdominal pain, pancreatitis, leukocytosis, anemia, history diabetes, hypertension, hyperlipidemia, irritable bowel syndrome  EXAM: CT ABDOMEN AND PELVIS WITH CONTRAST  TECHNIQUE: Multidetector CT imaging of the abdomen and pelvis was performed using the standard protocol following bolus administration of intravenous contrast. Sagittal and coronal MPR images reconstructed from axial data set.  CONTRAST:  141mL OMNIPAQUE IOHEXOL 300 MG/ML SOLN IV. Dilute oral contrast.  COMPARISON:  08/21/2014  FINDINGS: Lung bases clear.  Single tiny calcification within RIGHT adrenal gland unchanged.  Single calcified granuloma within spleen.  Liver, spleen, pancreas, kidneys, and adrenal glands otherwise normal appearance.  Specifically, no definite CT features of acute pancreatitis.  Unremarkable bladder, ovaries and ureters with surgical absence of uterus.  Cecal wall minimally prominent but the cecum is incompletely distended, question artifact.  Stomach and bowel loops otherwise normal appearance.  Normal appendix.  No mass, adenopathy, free fluid or inflammatory process.  No acute osseous findings.  IMPRESSION: No acute intra-abdominal or intrapelvic abnormalities.  Specifically no CT features of pancreatitis identified.   Electronically Signed   By: Lavonia Dana M.D.   On: 08/24/2014 15:11    Scheduled Meds: . hydrALAZINE  20 mg Intravenous 3 times per day  . insulin aspart  0-9 Units Subcutaneous 6 times per day  . insulin glargine  25 Units Subcutaneous Daily  . labetalol  10 mg Intravenous Q12H  . pantoprazole (PROTONIX) IV  40 mg Intravenous Q12H  . sodium chloride  3 mL Intravenous Q12H    Continuous Infusions: . sodium chloride 20 mL/hr at 08/25/14 0439      Time spent: 25 minutes    Navika Hoopes, [place]  [facility] Pager [phone]. If 7PM-7AM, please contact night-coverage at [email or web address], password [facility] 08/25/2014, 4:23 PM  LOS: 3 days

## 2014-08-25 NOTE — OR Nursing (Signed)
At 1005 BP was 252/127 and is now 249/121.  MD notified.

## 2014-08-25 NOTE — OR Nursing (Signed)
Pt BP 261/133 then 263/130. Dr, Jillyn Hidden approved Apresoline 10 mg IV to be given. Administered by T. Nirvaan Frett.  Will continue to monitor.

## 2014-08-26 ENCOUNTER — Encounter (HOSPITAL_COMMUNITY): Payer: Self-pay | Admitting: Certified Registered Nurse Anesthetist

## 2014-08-26 ENCOUNTER — Encounter (HOSPITAL_COMMUNITY): Payer: Managed Care, Other (non HMO) | Admitting: Anesthesiology

## 2014-08-26 ENCOUNTER — Inpatient Hospital Stay (HOSPITAL_COMMUNITY): Payer: Managed Care, Other (non HMO) | Admitting: Anesthesiology

## 2014-08-26 ENCOUNTER — Encounter (HOSPITAL_COMMUNITY)
Admission: EM | Disposition: A | Discharge status: Home / Self Care | Payer: Managed Care, Other (non HMO) | Source: Home / Self Care | Attending: Internal Medicine

## 2014-08-26 DIAGNOSIS — K209 Esophagitis, unspecified without bleeding: Principal | ICD-10-CM | POA: Diagnosis present

## 2014-08-26 DIAGNOSIS — R109 Unspecified abdominal pain: Secondary | ICD-10-CM | POA: Diagnosis present

## 2014-08-26 HISTORY — PX: ESOPHAGOGASTRODUODENOSCOPY (EGD) WITH PROPOFOL: SHX5813

## 2014-08-26 LAB — GLUCOSE, CAPILLARY
GLUCOSE-CAPILLARY: 227 mg/dL — AB (ref 70–99)
GLUCOSE-CAPILLARY: 244 mg/dL — AB (ref 70–99)
Glucose-Capillary: 122 mg/dL — ABNORMAL HIGH (ref 70–99)
Glucose-Capillary: 133 mg/dL — ABNORMAL HIGH (ref 70–99)
Glucose-Capillary: 94 mg/dL (ref 70–99)

## 2014-08-26 SURGERY — ESOPHAGOGASTRODUODENOSCOPY (EGD) WITH PROPOFOL
Anesthesia: Monitor Anesthesia Care | Laterality: Left

## 2014-08-26 MED ORDER — METOCLOPRAMIDE HCL 5 MG PO TABS: 5.0000 mg | ORAL_TABLET | Freq: Three times a day (TID) | ORAL | Status: DC | PRN

## 2014-08-26 MED ORDER — PROPOFOL 10 MG/ML IV BOLUS
INTRAVENOUS | Status: DC | PRN
  Administered 2014-08-26: 10 mg via INTRAVENOUS
  Administered 2014-08-26 (×2): 20 mg via INTRAVENOUS

## 2014-08-26 MED ORDER — ONDANSETRON 8 MG PO TBDP: 8.0000 mg | ORAL_TABLET | Freq: Three times a day (TID) | ORAL | Status: DC | PRN

## 2014-08-26 MED ORDER — MIDAZOLAM HCL 2 MG/2ML IJ SOLN
INTRAMUSCULAR | Status: AC
  Filled 2014-08-26: qty 2

## 2014-08-26 MED ORDER — PROPOFOL 10 MG/ML IV BOLUS
INTRAVENOUS | Status: AC
  Filled 2014-08-26: qty 20

## 2014-08-26 MED ORDER — PANTOPRAZOLE SODIUM 40 MG PO TBEC: 40.0000 mg | DELAYED_RELEASE_TABLET | Freq: Every day | ORAL | Status: DC

## 2014-08-26 MED ORDER — HYDRALAZINE HCL 50 MG PO TABS: 50.0000 mg | ORAL_TABLET | Freq: Three times a day (TID) | ORAL | Status: DC

## 2014-08-26 MED ORDER — PANTOPRAZOLE SODIUM 40 MG PO TBEC
40.0000 mg | DELAYED_RELEASE_TABLET | Freq: Every day | ORAL | Status: DC
  Administered 2014-08-26: 40 mg via ORAL
  Filled 2014-08-26: qty 1

## 2014-08-26 MED ORDER — INSULIN GLARGINE 100 UNIT/ML ~~LOC~~ SOLN
30.0000 [IU] | Freq: Every day | SUBCUTANEOUS | Status: DC
  Administered 2014-08-26: 30 [IU] via SUBCUTANEOUS
  Filled 2014-08-26: qty 0.3

## 2014-08-26 MED ORDER — LACTATED RINGERS IV SOLN
INTRAVENOUS | Status: DC | PRN
  Administered 2014-08-26: 08:00:00 via INTRAVENOUS

## 2014-08-26 MED ORDER — LACTATED RINGERS IV SOLN
INTRAVENOUS | Status: DC
Start: 2014-08-26 — End: 2014-08-26
  Administered 2014-08-26: 1000 mL via INTRAVENOUS

## 2014-08-26 MED ORDER — ONDANSETRON HCL 4 MG/2ML IJ SOLN
INTRAMUSCULAR | Status: AC
  Filled 2014-08-26: qty 2

## 2014-08-26 MED ORDER — MIDAZOLAM HCL 5 MG/5ML IJ SOLN
INTRAMUSCULAR | Status: DC | PRN
  Administered 2014-08-26 (×2): 1 mg via INTRAVENOUS

## 2014-08-26 MED ORDER — ONDANSETRON HCL 4 MG/2ML IJ SOLN
INTRAMUSCULAR | Status: DC | PRN
  Administered 2014-08-26 (×2): 2 mg via INTRAVENOUS

## 2014-08-26 MED ORDER — BUTAMBEN-TETRACAINE-BENZOCAINE 2-2-14 % EX AERO
INHALATION_SPRAY | CUTANEOUS | Status: DC | PRN
  Administered 2014-08-26: 2 via TOPICAL

## 2014-08-26 SURGICAL SUPPLY — 14 items

## 2014-08-26 NOTE — H&P (View-Only) (Signed)
[facility] PROGRESS NOTE  SHALYSE KEARLEY [id number] DOB: 02-02-1961 DOA: 08/22/2014 PCP: Minerva Ends, MD Brief narrative 53 year old female with history of insulin-dependent diabetes mellitus, hypertension, hyperlipidemia, irritable bowel syndrome is indicative 2-3 months out off-and-on upper abdominal pain with nausea and vomiting which has worsened over the past 4-5 days. She'll also reported small amounts of bloody emesis. Patient has been taking NSAIDs daily for joint pains and has also lost 40 pounds over the past one year. 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 GI consulted.   Assessment/Plan: Abdominal pain with intractable nausea and vomiting Differential includes acute duodenitis, diabetes gastroparesis, gastritis/peptic ulcer disease versus groove pancreatitis. -Hemoglobin stable. Continue n.p.o. with supportive care with IV fluids and IV PPI. Repeat CT abdomen and pelvis with contrast on 9/8 without any acute findings seen on admission. No signs of pancreatitis. -EGD planned for today but was unable to given uncontrolled hypertension and rescheduled for tomorrow.  Accelerated hypertension Will place on scheduled hydralazine and labetalol.  Uncontrolled type 2 diabetes mellitus Last A1c of 13.4. Patient on 70/30 and switch to Lantus given risk for hypoglycemia while she  is n.p.o. Lantus increased to 30 units daily given elevated FSG. Monitor with sliding scale insulin.  Weight loss Possibly to primary problem. Follow with EGD  Hypokalemia Replenished  Diet: N.p.o.  DVT prophylaxis: Subcutaneous Lovenox  Code Status: Full code Family Communication: None at bedside Disposition Plan: home pending EGD   Consultants:  [facility]  Procedures:  For EGD in a.m.  Antibiotics:  None  HPI/Subjective: Patient seen and examined this morning. Patient taken for  EGD but was aborted as blood pressure was uncontrolled. Denies  any nausea or vomiting. Denies abdominal pain  Objective: Filed Vitals:   08/25/14 1411  BP: 181/93  Pulse: 96  Temp: 98.1 F (36.7 C)  Resp: 15    Intake/Output Summary (Last 24 hours) at 08/25/14 1623 Last data filed at 08/25/14 1411  Gross per 24 hour  Intake    207 ml  Output   1600 ml  Net  -1393 ml   Filed Weights   08/23/14 0225 08/25/14 0924  Weight: 84 kg (185 lb 3 oz) 83.915 kg (185 lb)    Exam:   General: Middle aged obese female in no acute distress  HEENT: No pallor, moist oral mucosa  Cardiovascular: Normal S1 and S2, no murmurs  Respiratory: Clear bilaterally, no added sounds  Abdomen: Soft, nondistended, nontender, bowel sounds present  Musculoskeletal: Warm, no edema  CNS: Alert and oriented  Data Reviewed: Basic Metabolic Panel:  Recent Labs Lab 08/22/14 2145 08/23/14 0200 08/23/14 0612 08/24/14 0700 08/24/14 1630 08/25/14 0420  NA 137 137 138 136*  --  133*  K 3.7 4.0 3.5* 3.4*  --  3.7  CL 95* 96 96 97  --  96  CO2 22 23 22 22   --  23  GLUCOSE 171* 216* 228* 239*  --  218*  BUN 15 17 17 11   --  8  CREATININE 0.66 0.67 0.65 0.60  --  0.61  CALCIUM 9.4 9.2 9.4 9.5  --  9.3  MG  --   --   --   --  2.0  --    Liver Function Tests:  Recent Labs Lab 08/21/14 1005 08/22/14 2145 08/23/14 0612  AST 11 15 12   ALT 12 12 10   ALKPHOS 71 66 68  BILITOT 0.6 0.5 0.4  PROT 8.4* 7.9 7.9  ALBUMIN 3.8  3.7 3.5    Recent Labs Lab 08/21/14 1005 08/22/14 2145 08/24/14 0700  LIPASE 35 22 17   No results found for this basename: AMMONIA,  in the last 168 hours CBC:  Recent Labs Lab 08/21/14 1005 08/22/14 2145 08/23/14 0612 08/24/14 0700  WBC 9.8 11.5* 12.4* 14.1*  NEUTROABS 7.3 9.1*  --   --   HGB 14.3 13.4 13.7 14.5  HCT 41.1 39.6 40.9 42.6  MCV 81.7 82.7 83.3 83.7  PLT 301 292 325 359   Cardiac Enzymes:  Recent Labs Lab 08/23/14 0200  TROPONINI <0.30   BNP (last 3 results) No results found for this basename:  PROBNP,  in the last 8760 hours CBG:  Recent Labs Lab 08/24/14 2032 08/24/14 2351 08/25/14 0438 08/25/14 0744 08/25/14 1201  GLUCAP 210* 235* 199* 186* 209*    No results found for this or any previous visit (from the past 240 hour(s)).   Studies: Ct Abdomen Pelvis W Contrast  08/24/2014   CLINICAL DATA:  Diffuse abdominal pain, pancreatitis, leukocytosis, anemia, history diabetes, hypertension, hyperlipidemia, irritable bowel syndrome  EXAM: CT ABDOMEN AND PELVIS WITH CONTRAST  TECHNIQUE: Multidetector CT imaging of the abdomen and pelvis was performed using the standard protocol following bolus administration of intravenous contrast. Sagittal and coronal MPR images reconstructed from axial data set.  CONTRAST:  159mL OMNIPAQUE IOHEXOL 300 MG/ML SOLN IV. Dilute oral contrast.  COMPARISON:  08/21/2014  FINDINGS: Lung bases clear.  Single tiny calcification within RIGHT adrenal gland unchanged.  Single calcified granuloma within spleen.  Liver, spleen, pancreas, kidneys, and adrenal glands otherwise normal appearance.  Specifically, no definite CT features of acute pancreatitis.  Unremarkable bladder, ovaries and ureters with surgical absence of uterus.  Cecal wall minimally prominent but the cecum is incompletely distended, question artifact.  Stomach and bowel loops otherwise normal appearance.  Normal appendix.  No mass, adenopathy, free fluid or inflammatory process.  No acute osseous findings.  IMPRESSION: No acute intra-abdominal or intrapelvic abnormalities.  Specifically no CT features of pancreatitis identified.   Electronically Signed   By: Lavonia Dana M.D.   On: 08/24/2014 15:11    Scheduled Meds: . hydrALAZINE  20 mg Intravenous 3 times per day  . insulin aspart  0-9 Units Subcutaneous 6 times per day  . insulin glargine  25 Units Subcutaneous Daily  . labetalol  10 mg Intravenous Q12H  . pantoprazole (PROTONIX) IV  40 mg Intravenous Q12H  . sodium chloride  3 mL Intravenous Q12H    Continuous Infusions: . sodium chloride 20 mL/hr at 08/25/14 0439      Time spent: 25 minutes    Santino Kinsella, [place]  [facility] Pager [phone]. If 7PM-7AM, please contact night-coverage at [email or web address], password [facility] 08/25/2014, 4:23 PM  LOS: 3 days

## 2014-08-26 NOTE — Discharge Instructions (Signed)
Esophagitis Esophagitis is inflammation of the esophagus. It can involve swelling, soreness, and pain in the esophagus. This condition can make it difficult and painful to swallow. CAUSES  Most causes of esophagitis are not serious. Many different factors can cause esophagitis, including:  Gastroesophageal reflux disease (GERD). This is when acid from your stomach flows up into the esophagus.  Recurrent vomiting.  An allergic-type reaction.  Certain medicines, especially those that come in large pills.  Ingestion of harmful chemicals, such as household cleaning products.  Heavy alcohol use.  An infection of the esophagus.  Radiation treatment for cancer.  Certain diseases such as sarcoidosis, Crohn's disease, and scleroderma. These diseases may cause recurrent esophagitis. SYMPTOMS   Trouble swallowing.  Painful swallowing.  Chest pain.  Difficulty breathing.  Nausea.  Vomiting.  Abdominal pain. DIAGNOSIS  Your caregiver will take your history and do a physical exam. Depending upon what your caregiver finds, certain tests may also be done, including:  Barium X-ray. You will drink a solution that coats the esophagus, and X-rays will be taken.  Endoscopy. A lighted tube is put down the esophagus so your caregiver can examine the area.  Allergy tests. These can sometimes be arranged through follow-up visits. TREATMENT  Treatment will depend on the cause of your esophagitis. In some cases, steroids or other medicines may be given to help relieve your symptoms or to treat the underlying cause of your condition. Medicines that may be recommended include:  Viscous lidocaine, to soothe the esophagus.  Antacids.  Acid reducers.  Proton pump inhibitors.  Antiviral medicines for certain viral infections of the esophagus.  Antifungal medicines for certain fungal infections of the esophagus.  Antibiotic medicines, depending on the cause of the esophagitis. HOME CARE  INSTRUCTIONS   Avoid foods and drinks that seem to make your symptoms worse.  Eat small, frequent meals instead of large meals.  Avoid eating for the 3 hours prior to your bedtime.  If you have trouble taking pills, use a pill splitter to decrease the size and likelihood of the pill getting stuck or injuring the esophagus on the way down. Drinking water after taking a pill also helps.  Stop smoking if you smoke.  Maintain a healthy weight.  Wear loose-fitting clothing. Do not wear anything tight around your waist that causes pressure on your stomach.  Raise the head of your bed 6 to 8 inches with wood blocks to help you sleep. Extra pillows will not help.  Only take over-the-counter or prescription medicines as directed by your caregiver. SEEK IMMEDIATE MEDICAL CARE IF:  You have severe chest pain that radiates into your arm, neck, or jaw.  You feel sweaty, dizzy, or lightheaded.  You have shortness of breath.  You vomit blood.  You have difficulty or pain with swallowing.  You have bloody or black, tarry stools.  You have a fever.  You have a burning sensation in the chest more than 3 times a week for more than 2 weeks.  You cannot swallow, drink, or eat.  You drool because you cannot swallow your saliva. MAKE SURE YOU:  Understand these instructions.  Will watch your condition.  Will get help right away if you are not doing well or get worse. Document Released: 01/10/2005 Document Revised: 02/25/2012 Document Reviewed: 08/03/2011 Exit[employer] Patient Information 2015 [employer]. This information is not intended to replace advice given to you by your health care provider. Make sure you discuss any questions you have with your health care provider.  

## 2014-08-26 NOTE — Op Note (Signed)
[facility] [place], [address]   ENDOSCOPY PROCEDURE REPORT  PATIENT: Nancy [name], Nancy [name]  MR#: [id number] BIRTHDATE: 17-Apr-1961 , 68  yrs. old GENDER: Female ENDOSCOPIST: Arta Silence, MD REFERRED BY:  [facility] PROCEDURE DATE:  08/26/2014 PROCEDURE:  EGD, diagnostic ASA CLASS:     Class III INDICATIONS:  epigastric abdominal pain. MEDICATIONS: MAC sedation, administered by CRNA TOPICAL ANESTHETIC: Cetacaine Spray  DESCRIPTION OF PROCEDURE: After the risks benefits and alternatives of the procedure were thoroughly explained, informed consent was obtained.  The diagnostic forward-viewing gastroscope was introduced through the mouth and advanced to the second portion of the duodenum. Without limitations.  The instrument was slowly withdrawn as the mucosa was fully examined.     Findings:  Mild distal esophagitis (LA-A/B).  Otherwise normal esophagus.  Normal stomach, including normal retroflexed views of cardia.  Normal pylorus.  Normal duodenum to the second portion.          The scope was then withdrawn from the patient and the procedure completed.  ENDOSCOPIC IMPRESSION:     As above.  No obvious explanation for patient's symptoms identified, though fortunately, with time, her pain has much improved.  RECOMMENDATIONS:     1.  Watch for potential complications of procedure. 2.  Protonix 40 mg po qd for 6 weeks. 3.  Avoid/minimize use of NSAIDs. 4.  Advance diet as tolerated. 5.  As long as patient tolerates advancing of diet, patient can be discharged home from GI perspective. 6.  Will sign-off; will arrange outpatient follow-up; thank you for the consultation; please call with questions.  eSigned:  Arta Silence, MD 08/26/2014 8:53 AM   CC:

## 2014-08-26 NOTE — Interval H&P Note (Signed)
History and Physical Interval Note:  08/26/2014 8:16 AM  Nancy [name]  has presented today for surgery, with the diagnosis of abdominal pain  The various methods of treatment have been discussed with the patient and family. After consideration of risks, benefits and other options for treatment, the patient has consented to  Procedure(s): ESOPHAGOGASTRODUODENOSCOPY (EGD) WITH PROPOFOL (Left) as a surgical intervention .  The patient's history has been reviewed, patient examined, no change in status, stable for surgery.  I have reviewed the patient's chart and labs.  Questions were answered to the patient's satisfaction.     Nancy [name] M  Assessment:  1.  Epigastric abdominal pain.  Much improved. 2.  Hypertension, better controlled.  Plan:  1.  Endoscopy. 2.  Risks (bleeding, infection, bowel perforation that could require surgery, sedation-related changes in cardiopulmonary systems), benefits (identification and possible treatment of source of symptoms, exclusion of certain causes of symptoms), and alternatives (watchful waiting, radiographic imaging studies, empiric medical treatment) of upper endoscopy (EGD) were explained to patient/family in detail and patient wishes to proceed.

## 2014-08-26 NOTE — Anesthesia Postprocedure Evaluation (Signed)
  Anesthesia Post-op Note  Patient: Nancy [name]  Procedure(s) Performed: Procedure(s) (LRB): ESOPHAGOGASTRODUODENOSCOPY (EGD) WITH PROPOFOL (Left)  Patient Location: PACU  Anesthesia Type: MAC  Level of Consciousness: awake and alert   Airway and Oxygen Therapy: Patient Spontanous Breathing  Post-op Pain: mild  Post-op Assessment: Post-op Vital signs reviewed, Patient's Cardiovascular Status Stable, Respiratory Function Stable, Patent Airway and No signs of Nausea or vomiting  Last Vitals:  Filed Vitals:   08/26/14 0848  BP: 137/62  Pulse: 103  Temp: 36.8 C  Resp: 14    Post-op Vital Signs: stable   Complications: No apparent anesthesia complications

## 2014-08-26 NOTE — Transfer of Care (Signed)
Immediate Anesthesia Transfer of Care Note  Patient: Nancy [name]  Procedure(s) Performed: Procedure(s): ESOPHAGOGASTRODUODENOSCOPY (EGD) WITH PROPOFOL (Left)  Patient Location: PACU and Endoscopy Unit  Anesthesia Type:MAC  Level of Consciousness: awake, oriented, patient cooperative, lethargic and responds to stimulation  Airway & Oxygen Therapy: Patient Spontanous Breathing and Patient connected to nasal cannula oxygen  Post-op Assessment: Report given to PACU RN, Post -op Vital signs reviewed and stable and Patient moving all extremities  Post vital signs: Reviewed and stable  Complications: No apparent anesthesia complications

## 2014-08-26 NOTE — Discharge Summary (Signed)
Physician Discharge Summary  Nancy [name] [id number] DOB: [birth year] DOA: 08/22/2014  PCP: Minerva Ends, MD  Admit date: 08/22/2014 Discharge date: 08/26/2014  Time spent: 35 minutes  Recommendations for Outpatient Follow-up:  1. D/c home with outp PCP follow up in 1 week. Patient instructed to keep a log of her blood glucose readings at home. 2. Follow up with Dr Paulita Fujita in 6 weeks.  Discharge Diagnoses:  Principal Problem:   Acute esophagitis   Active Problems:   Type II diabetes mellitus with neurological manifestations, uncontrolled   Hypokalemia   Obesity (BMI 30-39.9)   Severe uncontrolled hypertension   Nausea with vomiting   Intractable abdominal pain   Discharge Condition: fair  Diet recommendation: soft diet, advance to diabetic as tolerated.  Filed Weights   08/23/14 0225 08/25/14 0924  Weight: 84 kg (185 lb 3 oz) 83.915 kg (185 lb)    History of present illness:  Please refer to admission H&P for details, but in brief, 53 year old female with history of insulin-dependent diabetes mellitus, hypertension, hyperlipidemia, irritable bowel syndrome is indicative 2-3 months out off-and-on upper abdominal pain with nausea and vomiting which has worsened over the past 4-5 days. She'll also reported small amounts of bloody emesis. Patient has been taking NSAIDs daily for joint pains and has also lost 40 pounds over the past one year. 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 [facility] consulted.   Hospital Course:  Abdominal pain with intractable nausea and vomiting  -Hemoglobin stable. patient kept n.p.o. with supportive care with IV fluids and IV PPI. Repeat CT abdomen and pelvis with contrast on 9/8 without any acute findings seen on admission. No signs of pancreatitis.  -EGD done on 9/10 shows distal esophagitis without bleeding or ulceration, normal stomach and duodenum ( upto second part). patient tolerate procedure well and  placed on soft diet which she has tolerated. -GI recommends daily protonix for 6 weeks and outpt follow up. patient strongly instructed on avoiding NSAIDs which she has been taking daily as outpt for pain. -cannot r/o component of gastroparesis. Will prescribe prn reglan.    Accelerated hypertension  Will increase hydralazine to 50 mg tid .  continue lisinopril and HCTZ.  Uncontrolled type 2 diabetes mellitus  Last A1c of 13.4. Patient on 70/30 and switch to Lantus given risk for hypoglycemia while she is n.p.o in the hospital.  continue home dose novolog upon discharge. counseled on diet and medication compliance. Also instructed to keep a log of her fsg at home.  Weight loss   May need colonoscopy as outpt.   Hypokalemia  Replenished     Code Status: Full code   Family Communication: None at bedside   Disposition Plan: home    Consultants:  [facility]   Procedures:  EGD on 08/26/2014   Antibiotics:  None     Discharge Exam: Filed Vitals:   08/26/14 1020  BP: 141/77  Pulse: 93  Temp:   Resp:     General:  no acute distress  HEENT:  moist oral mucosa  Cardiovascular: Normal S1 and S2, 2/6 systolic murmur  Respiratory: Clear bilaterally, no added sounds  Abdomen: Soft, nondistended, nontender, bowel sounds present  Musculoskeletal: Warm, no edema     The results of significant diagnostics from this hospitalization (including imaging, microbiology, ancillary and laboratory) are listed below for reference.    Significant Diagnostic Studies: Dg Chest 2 View  08/22/2014   CLINICAL DATA:  Nausea and emesis. Chest and abdomen  pain. Weakness.  EXAM: CHEST  2 VIEW  COMPARISON:  07/1914  FINDINGS: The heart size and mediastinal contours are within normal limits. Both lungs are clear. The visualized skeletal structures are unremarkable.  IMPRESSION: No active cardiopulmonary disease.   Electronically Signed   By: Lucienne Capers M.D.   On: 08/22/2014 21:30   Dg  Chest 2 View  08/04/2014   CLINICAL DATA:  Nausea.  Emesis.  EXAM: CHEST  2 VIEW  COMPARISON:  01/19/2010.  FINDINGS: Mediastinum and hilar structures are normal. Subsegmental atelectasis lung bases. No pleural effusion or pneumothorax. No acute osseus abnormality.  IMPRESSION: Subsegmental atelectasis both lung bases. Exam otherwise unremarkable.   Electronically Signed   By: Marcello Moores  Register   On: 08/04/2014 12:00   Ct Abdomen Pelvis W Contrast  08/24/2014   CLINICAL DATA:  Diffuse abdominal pain, pancreatitis, leukocytosis, anemia, history diabetes, hypertension, hyperlipidemia, irritable bowel syndrome  EXAM: CT ABDOMEN AND PELVIS WITH CONTRAST  TECHNIQUE: Multidetector CT imaging of the abdomen and pelvis was performed using the standard protocol following bolus administration of intravenous contrast. Sagittal and coronal MPR images reconstructed from axial data set.  CONTRAST:  17mL OMNIPAQUE IOHEXOL 300 MG/ML SOLN IV. Dilute oral contrast.  COMPARISON:  08/21/2014  FINDINGS: Lung bases clear.  Single tiny calcification within RIGHT adrenal gland unchanged.  Single calcified granuloma within spleen.  Liver, spleen, pancreas, kidneys, and adrenal glands otherwise normal appearance.  Specifically, no definite CT features of acute pancreatitis.  Unremarkable bladder, ovaries and ureters with surgical absence of uterus.  Cecal wall minimally prominent but the cecum is incompletely distended, question artifact.  Stomach and bowel loops otherwise normal appearance.  Normal appendix.  No mass, adenopathy, free fluid or inflammatory process.  No acute osseous findings.  IMPRESSION: No acute intra-abdominal or intrapelvic abnormalities.  Specifically no CT features of pancreatitis identified.   Electronically Signed   By: Lavonia Dana M.D.   On: 08/24/2014 15:11   Ct Abdomen Pelvis W Contrast  08/21/2014   CLINICAL DATA:  Nausea and vomiting  EXAM: CT ABDOMEN AND PELVIS WITH CONTRAST  TECHNIQUE: Multidetector CT  imaging of the abdomen and pelvis was performed using the standard protocol following bolus administration of intravenous contrast.  CONTRAST:  45mL OMNIPAQUE IOHEXOL 300 MG/ML SOLN, 1103mL OMNIPAQUE IOHEXOL 300 MG/ML SOLN  COMPARISON:  None.  FINDINGS: Liver, gallbladder, adrenal glands, kidneys, and pancreas are unremarkable. Splenic granuloma incidentally noted. There is minimal hypoenhancement of the pancreatic uncinate process, adjacent duodenal wall thickening, and surrounding stranding. No pancreatic ductal dilatation. No surrounding fluid collection. 7 mm short axis porta hepatis node identified image 24. No free air. No evidence for bowel obstruction.  No other bowel wall thickening or focal segmental dilatation. The appendix is normal. The bladder is normal. Ovaries are unremarkable. Uterus presumed surgically absent. No acute osseous abnormality.  IMPRESSION: Second portion of duodenum borderline wall thickening, surrounding stranding, and hypoenhancement of the uncinate process of the pancreas; these findings suggest groove pancreatitis. Correlate with amylase and lipase. No complicating features such as abscess/ pseudocyst or bowel obstruction.   Electronically Signed   By: Conchita Paris M.D.   On: 08/21/2014 13:40   Dg Abd 2 Views  08/05/2014   CLINICAL DATA:  Nausea and vomiting  EXAM: ABDOMEN - 2 VIEW  COMPARISON:  None.  FINDINGS: Supine and upright images were obtained. There is diffuse stool throughout the colon. The bowel gas pattern is unremarkable. No obstruction or free air. There is a small phlebolith  in the right pelvis.  IMPRESSION: Diffuse stool throughout colon. Overall bowel gas pattern unremarkable.   Electronically Signed   By: Lowella Grip M.D.   On: 08/05/2014 14:06    Microbiology: No results found for this or any previous visit (from the past 240 hour(s)).   Labs: Basic Metabolic Panel:  Recent Labs Lab 08/22/14 2145 08/23/14 0200 08/23/14 0612 08/24/14 0700  08/24/14 1630 08/25/14 0420  NA 137 137 138 136*  --  133*  K 3.7 4.0 3.5* 3.4*  --  3.7  CL 95* 96 96 97  --  96  CO2 22 23 22 22   --  23  GLUCOSE 171* 216* 228* 239*  --  218*  BUN 15 17 17 11   --  8  CREATININE 0.66 0.67 0.65 0.60  --  0.61  CALCIUM 9.4 9.2 9.4 9.5  --  9.3  MG  --   --   --   --  2.0  --    Liver Function Tests:  Recent Labs Lab 08/21/14 1005 08/22/14 2145 08/23/14 0612  AST 11 15 12   ALT 12 12 10   ALKPHOS 71 66 68  BILITOT 0.6 0.5 0.4  PROT 8.4* 7.9 7.9  ALBUMIN 3.8 3.7 3.5    Recent Labs Lab 08/21/14 1005 08/22/14 2145 08/24/14 0700  LIPASE 35 22 17   No results found for this basename: AMMONIA,  in the last 168 hours CBC:  Recent Labs Lab 08/21/14 1005 08/22/14 2145 08/23/14 0612 08/24/14 0700  WBC 9.8 11.5* 12.4* 14.1*  NEUTROABS 7.3 9.1*  --   --   HGB 14.3 13.4 13.7 14.5  HCT 41.1 39.6 40.9 42.6  MCV 81.7 82.7 83.3 83.7  PLT 301 292 325 359   Cardiac Enzymes:  Recent Labs Lab 08/23/14 0200  TROPONINI <0.30   BNP: BNP (last 3 results) No results found for this basename: PROBNP,  in the last 8760 hours CBG:  Recent Labs Lab 08/25/14 1615 08/25/14 2012 08/26/14 0006 08/26/14 0430 08/26/14 0914  GLUCAP 171* 173* 133* 122* 94       Signed:  Coben Godshall  [facility] 08/26/2014, 11:12 AM

## 2014-08-26 NOTE — Discharge Summary (Addendum)
Physician Discharge Summary  Nancy [name] [id number] DOB: [birth year] DOA: 08/22/2014  PCP: Minerva Ends, MD  Admit date: 08/22/2014 Discharge date: 08/26/2014  Time spent: 35 minutes  Recommendations for Outpatient Follow-up:  1. D/c home with outp PCP follow up in 1 week.  2. Patient instructed to keep a log of her blood glucose readings at home. Follow up with Dr Paulita Fujita in 6 weeks.   Discharge Diagnoses:  Principal Problem:   Acute esophagitis   Active Problems:   Type II diabetes mellitus with neurological manifestations, uncontrolled   Hypokalemia   Obesity (BMI 30-39.9)   Severe uncontrolled hypertension   Nausea with vomiting   Intractable abdominal pain   Discharge Condition: fair  Diet recommendation: soft diet, advance to diabetic as tolerated.   Filed Weights   08/23/14 0225 08/25/14 0924  Weight: 84 kg (185 lb 3 oz) 83.915 kg (185 lb)    History of present illness:  Please refer to admission H&P for details, but in brief, 53 year old female with history of insulin-dependent diabetes mellitus, hypertension, hyperlipidemia, irritable bowel syndrome presented with 2-3 months  off-and-on upper abdominal pain with nausea and vomiting which has worsened over the past 4-5 days. She also reported small amounts of bloody emesis. Patient has been taking NSAIDs daily for joint pains and has also lost 40 pounds over the past one year. 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 [facility] consulted.   Hospital Course:   Hospital Course:  Abdominal pain with intractable nausea and vomiting  -Hemoglobin stable. patient kept n.p.o. with supportive care with IV fluids and IV PPI. Repeat CT abdomen and pelvis with contrast on 9/8 without any acute findings seen on admission. No signs of pancreatitis.  -EGD done on 9/10 shows distal esophagitis without bleeding or ulceration, normal stomach and duodenum ( upto second part). patient tolerate  procedure well and placed on soft diet which she has tolerated.  -GI recommends daily protonix for 6 weeks and outpt follow up. patient strongly instructed on avoiding NSAIDs which she has been taking daily as outpt for pain.  -cannot r/o component of gastroparesis. Will prescribe prn reglan.   Accelerated hypertension  Will increase hydralazine to 50 mg tid . continue lisinopril and HCTZ.   Uncontrolled type 2 diabetes mellitus  Last A1c of 13.4. Patient on 70/30 and switch to Lantus given risk for hypoglycemia while she is n.p.o in the hospital.  continue home dose novolog upon discharge. counseled on diet and medication compliance. Also instructed to keep a log of her fsg at home.   Weight loss  May need colonoscopy as outpt.   Hypokalemia  Replenished   Code Status: Full code  Family Communication: None at bedside  Disposition Plan: home   Consultants:  [facility]( Dr Paulita Fujita)    Procedures:  EGD on 08/26/2014   Antibiotics:  None       Discharge Exam: Filed Vitals:   08/26/14 1020  BP: 141/77  Pulse: 93  Temp:   Resp:    General: no acute distress  HEENT: moist oral mucosa  Cardiovascular: Normal S1 and S2, 2/6 systolic murmur  Respiratory: Clear bilaterally, no added sounds  Abdomen: Soft, nondistended, nontender, bowel sounds present  Musculoskeletal: Warm, no edema     Discharge Instructions You were cared for by a hospitalist during your hospital stay. If you have any questions about your discharge medications or the care you received while you were in the hospital after you are discharged,  you can call the unit and asked to speak with the hospitalist on call if the hospitalist that took care of you is not available. Once you are discharged, your primary care physician will handle any further medical issues. Please note that NO REFILLS for any discharge medications will be authorized once you are discharged, as it is imperative that you return to your  primary care physician (or establish a relationship with a primary care physician if you do not have one) for your aftercare needs so that they can reassess your need for medications and monitor your lab values.   Current Discharge Medication List    START taking these medications   Details  metoCLOPramide (REGLAN) 5 MG tablet Take 1 tablet (5 mg total) by mouth every 8 (eight) hours as needed for nausea or vomiting. Qty: 30 tablet, Refills: 0    pantoprazole (PROTONIX) 40 MG tablet Take 1 tablet (40 mg total) by mouth daily. Qty: 42 tablet, Refills: 0      CONTINUE these medications which have CHANGED   Details  hydrALAZINE (APRESOLINE) 50 MG tablet Take 1 tablet (50 mg total) by mouth 3 (three) times daily. Qty: 90 tablet, Refills: 0      CONTINUE these medications which have NOT CHANGED   Details  acetaminophen (TYLENOL) 500 MG tablet Take 1,000 mg by mouth every 6 (six) hours as needed for mild pain.    atorvastatin (LIPITOR) 40 MG tablet Take 1 tablet (40 mg total) by mouth daily. Qty: 90 tablet, Refills: 3   Associated Diagnoses: Uncontrolled secondary diabetes mellitus with neurological manifestations; Type II diabetes mellitus with neurological manifestations, uncontrolled    dicyclomine (BENTYL) 20 MG tablet Take 1 tablet (20 mg total) by mouth every 8 (eight) hours as needed for spasms. Qty: 20 tablet, Refills: 0    insulin aspart protamine- aspart (NOVOLOG MIX 70/30) (70-30) 100 UNIT/ML injection Inject 0.35 mLs (35 Units total) into the skin 2 (two) times daily with a meal. Qty: 21 mL, Refills: 11   Associated Diagnoses: Uncontrolled secondary diabetes mellitus with neurological manifestations; Type II diabetes mellitus with neurological manifestations, uncontrolled    lisinopril-hydrochlorothiazide (PRINZIDE,ZESTORETIC) 20-25 MG per tablet Take 1 tablet by mouth daily. Qty: 30 tablet, Refills: 3      STOP taking these medications     famotidine (PEPCID) 20 MG  tablet      ondansetron (ZOFRAN ODT) 8 MG disintegrating tablet        Allergies  Allergen Reactions  . Hydrocodone Other (See Comments)    Upset stomach  . Sulfur Hives   Follow-up Information   Follow up with Minerva Ends, MD. Schedule an appointment as soon as possible for a visit in 1 week.   Specialty:  Family Medicine   Contact information:   [place] [id number] [phone]       Follow up with Landry Dyke, MD In 4 weeks.   Specialty:  Gastroenterology   Contact information:   [id number], [place] [address] [phone]        The results of significant diagnostics from this hospitalization (including imaging, microbiology, ancillary and laboratory) are listed below for reference.    Significant Diagnostic Studies: Dg Chest 2 View  08/22/2014   CLINICAL DATA:  Nausea and emesis. Chest and abdomen pain. Weakness.  EXAM: CHEST  2 VIEW  COMPARISON:  07/1914  FINDINGS: The heart size and mediastinal contours are within normal limits. Both lungs are clear.  The visualized skeletal structures are unremarkable.  IMPRESSION: No active cardiopulmonary disease.   Electronically Signed   By: Lucienne Capers M.D.   On: 08/22/2014 21:30   Dg Chest 2 View  08/04/2014   CLINICAL DATA:  Nausea.  Emesis.  EXAM: CHEST  2 VIEW  COMPARISON:  01/19/2010.  FINDINGS: Mediastinum and hilar structures are normal. Subsegmental atelectasis lung bases. No pleural effusion or pneumothorax. No acute osseus abnormality.  IMPRESSION: Subsegmental atelectasis both lung bases. Exam otherwise unremarkable.   Electronically Signed   By: Marcello Moores  Register   On: 08/04/2014 12:00   Ct Abdomen Pelvis W Contrast  08/24/2014   CLINICAL DATA:  Diffuse abdominal pain, pancreatitis, leukocytosis, anemia, history diabetes, hypertension, hyperlipidemia, irritable bowel syndrome  EXAM: CT ABDOMEN AND PELVIS WITH CONTRAST  TECHNIQUE: Multidetector CT imaging  of the abdomen and pelvis was performed using the standard protocol following bolus administration of intravenous contrast. Sagittal and coronal MPR images reconstructed from axial data set.  CONTRAST:  142mL OMNIPAQUE IOHEXOL 300 MG/ML SOLN IV. Dilute oral contrast.  COMPARISON:  08/21/2014  FINDINGS: Lung bases clear.  Single tiny calcification within RIGHT adrenal gland unchanged.  Single calcified granuloma within spleen.  Liver, spleen, pancreas, kidneys, and adrenal glands otherwise normal appearance.  Specifically, no definite CT features of acute pancreatitis.  Unremarkable bladder, ovaries and ureters with surgical absence of uterus.  Cecal wall minimally prominent but the cecum is incompletely distended, question artifact.  Stomach and bowel loops otherwise normal appearance.  Normal appendix.  No mass, adenopathy, free fluid or inflammatory process.  No acute osseous findings.  IMPRESSION: No acute intra-abdominal or intrapelvic abnormalities.  Specifically no CT features of pancreatitis identified.   Electronically Signed   By: Lavonia Dana M.D.   On: 08/24/2014 15:11   Ct Abdomen Pelvis W Contrast  08/21/2014   CLINICAL DATA:  Nausea and vomiting  EXAM: CT ABDOMEN AND PELVIS WITH CONTRAST  TECHNIQUE: Multidetector CT imaging of the abdomen and pelvis was performed using the standard protocol following bolus administration of intravenous contrast.  CONTRAST:  99mL OMNIPAQUE IOHEXOL 300 MG/ML SOLN, 185mL OMNIPAQUE IOHEXOL 300 MG/ML SOLN  COMPARISON:  None.  FINDINGS: Liver, gallbladder, adrenal glands, kidneys, and pancreas are unremarkable. Splenic granuloma incidentally noted. There is minimal hypoenhancement of the pancreatic uncinate process, adjacent duodenal wall thickening, and surrounding stranding. No pancreatic ductal dilatation. No surrounding fluid collection. 7 mm short axis porta hepatis node identified image 24. No free air. No evidence for bowel obstruction.  No other bowel wall  thickening or focal segmental dilatation. The appendix is normal. The bladder is normal. Ovaries are unremarkable. Uterus presumed surgically absent. No acute osseous abnormality.  IMPRESSION: Second portion of duodenum borderline wall thickening, surrounding stranding, and hypoenhancement of the uncinate process of the pancreas; these findings suggest groove pancreatitis. Correlate with amylase and lipase. No complicating features such as abscess/ pseudocyst or bowel obstruction.   Electronically Signed   By: Conchita Paris M.D.   On: 08/21/2014 13:40   Dg Abd 2 Views  08/05/2014   CLINICAL DATA:  Nausea and vomiting  EXAM: ABDOMEN - 2 VIEW  COMPARISON:  None.  FINDINGS: Supine and upright images were obtained. There is diffuse stool throughout the colon. The bowel gas pattern is unremarkable. No obstruction or free air. There is a small phlebolith in the right pelvis.  IMPRESSION: Diffuse stool throughout colon. Overall bowel gas pattern unremarkable.   Electronically Signed   By: Lowella Grip M.D.  On: 08/05/2014 14:06    Microbiology: No results found for this or any previous visit (from the past 240 hour(s)).   Labs: Basic Metabolic Panel:  Recent Labs Lab 08/22/14 2145 08/23/14 0200 08/23/14 0612 08/24/14 0700 08/24/14 1630 08/25/14 0420  NA 137 137 138 136*  --  133*  K 3.7 4.0 3.5* 3.4*  --  3.7  CL 95* 96 96 97  --  96  CO2 22 23 22 22   --  23  GLUCOSE 171* 216* 228* 239*  --  218*  BUN 15 17 17 11   --  8  CREATININE 0.66 0.67 0.65 0.60  --  0.61  CALCIUM 9.4 9.2 9.4 9.5  --  9.3  MG  --   --   --   --  2.0  --    Liver Function Tests:  Recent Labs Lab 08/21/14 1005 08/22/14 2145 08/23/14 0612  AST 11 15 12   ALT 12 12 10   ALKPHOS 71 66 68  BILITOT 0.6 0.5 0.4  PROT 8.4* 7.9 7.9  ALBUMIN 3.8 3.7 3.5    Recent Labs Lab 08/21/14 1005 08/22/14 2145 08/24/14 0700  LIPASE 35 22 17   No results found for this basename: AMMONIA,  in the last 168  hours CBC:  Recent Labs Lab 08/21/14 1005 08/22/14 2145 08/23/14 0612 08/24/14 0700  WBC 9.8 11.5* 12.4* 14.1*  NEUTROABS 7.3 9.1*  --   --   HGB 14.3 13.4 13.7 14.5  HCT 41.1 39.6 40.9 42.6  MCV 81.7 82.7 83.3 83.7  PLT 301 292 325 359   Cardiac Enzymes:  Recent Labs Lab 08/23/14 0200  TROPONINI <0.30   BNP: BNP (last 3 results) No results found for this basename: PROBNP,  in the last 8760 hours CBG:  Recent Labs Lab 08/25/14 1615 08/25/14 2012 08/26/14 0006 08/26/14 0430 08/26/14 0914  GLUCAP 171* 173* 133* 122* 94       Signed:  Araya Roel  [facility] 08/26/2014, 11:37 AM

## 2014-08-27 ENCOUNTER — Encounter (HOSPITAL_COMMUNITY): Payer: Self-pay | Admitting: Gastroenterology

## 2014-09-07 ENCOUNTER — Encounter (HOSPITAL_COMMUNITY): Payer: Self-pay | Admitting: Emergency Medicine

## 2014-09-07 ENCOUNTER — Inpatient Hospital Stay (HOSPITAL_COMMUNITY)
Admission: EM | Admit: 2014-09-07 | Discharge: 2014-09-14 | DRG: 074 | Disposition: A | Discharge status: Home / Self Care | Payer: Managed Care, Other (non HMO) | Attending: Internal Medicine | Admitting: Internal Medicine

## 2014-09-07 DIAGNOSIS — Z6834 Body mass index (BMI) 34.0-34.9, adult: Secondary | ICD-10-CM

## 2014-09-07 DIAGNOSIS — E876 Hypokalemia: Secondary | ICD-10-CM | POA: Diagnosis present

## 2014-09-07 DIAGNOSIS — E1149 Type 2 diabetes mellitus with other diabetic neurological complication: Secondary | ICD-10-CM | POA: Diagnosis not present

## 2014-09-07 DIAGNOSIS — Z794 Long term (current) use of insulin: Secondary | ICD-10-CM

## 2014-09-07 DIAGNOSIS — E1365 Other specified diabetes mellitus with hyperglycemia: Secondary | ICD-10-CM

## 2014-09-07 DIAGNOSIS — E1165 Type 2 diabetes mellitus with hyperglycemia: Secondary | ICD-10-CM

## 2014-09-07 DIAGNOSIS — I10 Essential (primary) hypertension: Secondary | ICD-10-CM

## 2014-09-07 DIAGNOSIS — K3184 Gastroparesis: Secondary | ICD-10-CM

## 2014-09-07 DIAGNOSIS — R112 Nausea with vomiting, unspecified: Secondary | ICD-10-CM | POA: Diagnosis present

## 2014-09-07 DIAGNOSIS — K209 Esophagitis, unspecified without bleeding: Secondary | ICD-10-CM

## 2014-09-07 DIAGNOSIS — Z79899 Other long term (current) drug therapy: Secondary | ICD-10-CM

## 2014-09-07 DIAGNOSIS — E785 Hyperlipidemia, unspecified: Secondary | ICD-10-CM | POA: Diagnosis present

## 2014-09-07 DIAGNOSIS — E1143 Type 2 diabetes mellitus with diabetic autonomic (poly)neuropathy: Secondary | ICD-10-CM | POA: Diagnosis present

## 2014-09-07 DIAGNOSIS — IMO0002 Reserved for concepts with insufficient information to code with codable children: Secondary | ICD-10-CM | POA: Diagnosis present

## 2014-09-07 DIAGNOSIS — R1110 Vomiting, unspecified: Secondary | ICD-10-CM | POA: Diagnosis present

## 2014-09-07 DIAGNOSIS — R1115 Cyclical vomiting syndrome unrelated to migraine: Secondary | ICD-10-CM

## 2014-09-07 DIAGNOSIS — E669 Obesity, unspecified: Secondary | ICD-10-CM | POA: Diagnosis present

## 2014-09-07 DIAGNOSIS — H9190 Unspecified hearing loss, unspecified ear: Secondary | ICD-10-CM | POA: Diagnosis present

## 2014-09-07 DIAGNOSIS — E1349 Other specified diabetes mellitus with other diabetic neurological complication: Secondary | ICD-10-CM

## 2014-09-07 LAB — CBC WITH DIFFERENTIAL/PLATELET
BASOS PCT: 0 % (ref 0–1)
Basophils Absolute: 0 10*3/uL (ref 0.0–0.1)
Eosinophils Absolute: 0.1 10*3/uL (ref 0.0–0.7)
Eosinophils Relative: 1 % (ref 0–5)
HCT: 41 % (ref 36.0–46.0)
Hemoglobin: 14.2 g/dL (ref 12.0–15.0)
LYMPHS ABS: 2 10*3/uL (ref 0.7–4.0)
Lymphocytes Relative: 18 % (ref 12–46)
MCH: 28.2 pg (ref 26.0–34.0)
MCHC: 34.6 g/dL (ref 30.0–36.0)
MCV: 81.3 fL (ref 78.0–100.0)
Monocytes Absolute: 0.5 10*3/uL (ref 0.1–1.0)
Monocytes Relative: 4 % (ref 3–12)
NEUTROS PCT: 77 % (ref 43–77)
Neutro Abs: 8.9 10*3/uL — ABNORMAL HIGH (ref 1.7–7.7)
Platelets: 282 10*3/uL (ref 150–400)
RBC: 5.04 MIL/uL (ref 3.87–5.11)
RDW: 12.5 % (ref 11.5–15.5)
WBC: 11.4 10*3/uL — ABNORMAL HIGH (ref 4.0–10.5)

## 2014-09-07 LAB — COMPREHENSIVE METABOLIC PANEL
ALBUMIN: 4 g/dL (ref 3.5–5.2)
ALK PHOS: 76 U/L (ref 39–117)
ALT: 16 U/L (ref 0–35)
AST: 10 U/L (ref 0–37)
Anion gap: 17 — ABNORMAL HIGH (ref 5–15)
BUN: 7 mg/dL (ref 6–23)
CO2: 25 mEq/L (ref 19–32)
Calcium: 10.3 mg/dL (ref 8.4–10.5)
Chloride: 94 mEq/L — ABNORMAL LOW (ref 96–112)
Creatinine, Ser: 0.52 mg/dL (ref 0.50–1.10)
GFR calc Af Amer: >90 mL/min (ref >90)
GFR calc non Af Amer: >90 mL/min (ref >90)
GLUCOSE: 356 mg/dL — AB (ref 70–99)
Potassium: 3.7 mEq/L (ref 3.7–5.3)
SODIUM: 136 meq/L — AB (ref 137–147)
TOTAL PROTEIN: 8.6 g/dL — AB (ref 6.0–8.3)
Total Bilirubin: 0.5 mg/dL (ref 0.3–1.2)

## 2014-09-07 LAB — URINALYSIS, ROUTINE W REFLEX MICROSCOPIC
Bilirubin Urine: NEGATIVE
Glucose, UA: >1000 mg/dL — AB
HGB URINE DIPSTICK: NEGATIVE
Ketones, ur: 15 mg/dL — AB
Leukocytes, UA: NEGATIVE
Nitrite: NEGATIVE
Protein, ur: NEGATIVE mg/dL
SPECIFIC GRAVITY, URINE: 1.02 (ref 1.005–1.030)
Urobilinogen, UA: 0.2 mg/dL (ref 0.0–1.0)
pH: 7.5 (ref 5.0–8.0)

## 2014-09-07 LAB — URINE MICROSCOPIC-ADD ON

## 2014-09-07 LAB — GLUCOSE, CAPILLARY: GLUCOSE-CAPILLARY: 250 mg/dL — AB (ref 70–99)

## 2014-09-07 LAB — LIPASE, BLOOD: Lipase: 38 U/L (ref 11–59)

## 2014-09-07 MED ORDER — ACETAMINOPHEN 650 MG RE SUPP: 650.0000 mg | Freq: Four times a day (QID) | RECTAL | Status: DC | PRN

## 2014-09-07 MED ORDER — METOCLOPRAMIDE HCL 5 MG/ML IJ SOLN
10.0000 mg | Freq: Once | INTRAMUSCULAR | Status: AC
  Administered 2014-09-07: 10 mg via INTRAVENOUS
  Filled 2014-09-07: qty 2

## 2014-09-07 MED ORDER — SODIUM CHLORIDE 0.9 % IV SOLN
INTRAVENOUS | Status: AC
  Administered 2014-09-07: 150 mL/h via INTRAVENOUS
  Administered 2014-09-08: 09:00:00 via INTRAVENOUS

## 2014-09-07 MED ORDER — ONDANSETRON HCL 4 MG/2ML IJ SOLN: 4.0000 mg | Freq: Four times a day (QID) | INTRAMUSCULAR | Status: DC | PRN

## 2014-09-07 MED ORDER — INSULIN GLARGINE 100 UNIT/ML ~~LOC~~ SOLN
30.0000 [IU] | Freq: Every day | SUBCUTANEOUS | Status: DC
  Administered 2014-09-07: 30 [IU] via SUBCUTANEOUS
  Filled 2014-09-07 (×2): qty 0.3

## 2014-09-07 MED ORDER — HYDRALAZINE HCL 50 MG PO TABS
50.0000 mg | ORAL_TABLET | Freq: Three times a day (TID) | ORAL | Status: DC
  Administered 2014-09-07 – 2014-09-08 (×5): 50 mg via ORAL
  Filled 2014-09-07 (×9): qty 1

## 2014-09-07 MED ORDER — ALBUTEROL SULFATE (2.5 MG/3ML) 0.083% IN NEBU: 2.5000 mg | INHALATION_SOLUTION | RESPIRATORY_TRACT | Status: DC | PRN

## 2014-09-07 MED ORDER — ONDANSETRON HCL 4 MG/2ML IJ SOLN
4.0000 mg | Freq: Four times a day (QID) | INTRAMUSCULAR | Status: DC | PRN
  Administered 2014-09-08 – 2014-09-11 (×6): 4 mg via INTRAVENOUS
  Filled 2014-09-07 (×6): qty 2

## 2014-09-07 MED ORDER — PROMETHAZINE HCL 25 MG/ML IJ SOLN
25.0000 mg | Freq: Once | INTRAMUSCULAR | Status: AC
  Administered 2014-09-07: 25 mg via INTRAVENOUS
  Filled 2014-09-07: qty 1

## 2014-09-07 MED ORDER — ATORVASTATIN CALCIUM 40 MG PO TABS
40.0000 mg | ORAL_TABLET | Freq: Every day | ORAL | Status: DC
  Administered 2014-09-07 – 2014-09-14 (×8): 40 mg via ORAL
  Filled 2014-09-07 (×8): qty 1

## 2014-09-07 MED ORDER — ONDANSETRON HCL 4 MG/2ML IJ SOLN
4.0000 mg | Freq: Once | INTRAMUSCULAR | Status: AC
  Administered 2014-09-07: 4 mg via INTRAVENOUS
  Filled 2014-09-07: qty 2

## 2014-09-07 MED ORDER — HYDROMORPHONE HCL 1 MG/ML IJ SOLN
0.5000 mg | INTRAMUSCULAR | Status: DC | PRN
  Administered 2014-09-08 – 2014-09-11 (×11): 0.5 mg via INTRAVENOUS
  Filled 2014-09-07 (×11): qty 1

## 2014-09-07 MED ORDER — DIPHENHYDRAMINE HCL 50 MG/ML IJ SOLN
12.5000 mg | Freq: Once | INTRAMUSCULAR | Status: AC
  Administered 2014-09-07: 12.5 mg via INTRAVENOUS
  Filled 2014-09-07: qty 1

## 2014-09-07 MED ORDER — PROMETHAZINE HCL 25 MG/ML IJ SOLN: 6.2500 mg | INTRAMUSCULAR | Status: DC | PRN

## 2014-09-07 MED ORDER — SODIUM CHLORIDE 0.9 % IV SOLN
Freq: Once | INTRAVENOUS | Status: AC
  Administered 2014-09-07: 05:00:00 via INTRAVENOUS

## 2014-09-07 MED ORDER — SUCRALFATE 1 GM/10ML PO SUSP
1.0000 g | Freq: Three times a day (TID) | ORAL | Status: DC
  Administered 2014-09-07 – 2014-09-14 (×19): 1 g via ORAL
  Filled 2014-09-07 (×31): qty 10

## 2014-09-07 MED ORDER — DICYCLOMINE HCL 20 MG PO TABS
20.0000 mg | ORAL_TABLET | Freq: Three times a day (TID) | ORAL | Status: DC | PRN
  Filled 2014-09-07: qty 1

## 2014-09-07 MED ORDER — ACETAMINOPHEN 325 MG PO TABS
650.0000 mg | ORAL_TABLET | Freq: Four times a day (QID) | ORAL | Status: DC | PRN
  Administered 2014-09-08 – 2014-09-12 (×3): 650 mg via ORAL
  Filled 2014-09-07 (×3): qty 2

## 2014-09-07 MED ORDER — METOCLOPRAMIDE HCL 5 MG/ML IJ SOLN
5.0000 mg | Freq: Four times a day (QID) | INTRAMUSCULAR | Status: DC
  Administered 2014-09-07 – 2014-09-09 (×8): 5 mg via INTRAVENOUS
  Filled 2014-09-07: qty 1
  Filled 2014-09-07 (×2): qty 2
  Filled 2014-09-07 (×5): qty 1
  Filled 2014-09-07: qty 2
  Filled 2014-09-07 (×2): qty 1

## 2014-09-07 MED ORDER — PANTOPRAZOLE SODIUM 40 MG IV SOLR
40.0000 mg | Freq: Two times a day (BID) | INTRAVENOUS | Status: DC
  Administered 2014-09-07 – 2014-09-14 (×14): 40 mg via INTRAVENOUS
  Filled 2014-09-07 (×15): qty 40

## 2014-09-07 MED ORDER — INSULIN ASPART 100 UNIT/ML ~~LOC~~ SOLN
0.0000 [IU] | Freq: Every day | SUBCUTANEOUS | Status: DC
  Administered 2014-09-07 – 2014-09-13 (×2): 2 [IU] via SUBCUTANEOUS

## 2014-09-07 MED ORDER — SODIUM CHLORIDE 0.9 % IV BOLUS (SEPSIS)
1000.0000 mL | Freq: Once | INTRAVENOUS | Status: AC
  Administered 2014-09-07: 1000 mL via INTRAVENOUS

## 2014-09-07 MED ORDER — INSULIN ASPART 100 UNIT/ML ~~LOC~~ SOLN
0.0000 [IU] | Freq: Three times a day (TID) | SUBCUTANEOUS | Status: DC
Start: 2014-09-07 — End: 2014-09-08
  Administered 2014-09-07: 2 [IU] via SUBCUTANEOUS
  Administered 2014-09-07: 13:00:00 via SUBCUTANEOUS
  Filled 2014-09-07: qty 1

## 2014-09-07 MED ORDER — LISINOPRIL 20 MG PO TABS
20.0000 mg | ORAL_TABLET | Freq: Every day | ORAL | Status: DC
  Administered 2014-09-07 – 2014-09-08 (×2): 20 mg via ORAL
  Filled 2014-09-07 (×3): qty 1

## 2014-09-07 MED ORDER — ONDANSETRON HCL 4 MG PO TABS: 4.0000 mg | ORAL_TABLET | Freq: Four times a day (QID) | ORAL | Status: DC | PRN

## 2014-09-07 MED ORDER — HYDRALAZINE HCL 20 MG/ML IJ SOLN
10.0000 mg | Freq: Four times a day (QID) | INTRAMUSCULAR | Status: DC | PRN
  Administered 2014-09-07 – 2014-09-10 (×5): 10 mg via INTRAVENOUS
  Filled 2014-09-07 (×6): qty 1

## 2014-09-07 MED ORDER — PROMETHAZINE HCL 25 MG/ML IJ SOLN
12.5000 mg | Freq: Four times a day (QID) | INTRAMUSCULAR | Status: DC | PRN
  Administered 2014-09-07 – 2014-09-11 (×11): 12.5 mg via INTRAVENOUS
  Filled 2014-09-07 (×11): qty 1

## 2014-09-07 NOTE — ED Notes (Signed)
Pt has been in and out of the hospital all month for the same complaint. Pt complains of abd pain and vomiting.

## 2014-09-07 NOTE — ED Provider Notes (Signed)
Medical screening examination/treatment/procedure(s) were performed by non-physician practitioner and as supervising physician I was immediately available for consultation/collaboration.   EKG Interpretation None        Julianne Rice, MD 09/07/14 2314

## 2014-09-07 NOTE — ED Notes (Signed)
CBG 228 VERIFIED

## 2014-09-07 NOTE — ED Notes (Signed)
Attempted to call report. RN not answering at this time.

## 2014-09-07 NOTE — ED Provider Notes (Signed)
CSN: [id number]     Arrival date & time 09/07/14  [id number] History   First MD Initiated Contact with Patient 09/07/14 0431     Chief Complaint  Patient presents with  . Emesis     (Consider location/radiation/quality/duration/timing/severity/associated sxs/prior Treatment) HPI Comments: PAtient with chronic N/V has been vomiting since 6 PM tonight despite taking PO Reglan   Patient is a 53 y.o. female presenting with vomiting. The history is provided by the patient.  Emesis Severity:  Severe Timing:  Intermittent Quality:  Bilious material Progression:  Unchanged Chronicity:  Chronic Recent urination:  Normal Relieved by:  Nothing Ineffective treatments:  Antiemetics Associated symptoms: abdominal pain   Associated symptoms: no chills, no cough, no diarrhea and no fever   Risk factors: diabetes     Past Medical History  Diagnosis Date  . HOH (hard of hearing) 2004   . Fracture of left ankle 1997   . Shingles 2009   . IBS (irritable bowel syndrome) 2002   . Leukopenia 2015   . Anemia 2006  . Diabetes mellitus with neurological manifestation 2006  . Hyperlipidemia 2006  . Hypertension 2006  . Depression 2014    previously on amitryptiline   . Thyroid nodule 2004   Past Surgical History  Procedure Laterality Date  . Abdominal hysterectomy  2005  . Cesarean section  1983   . Esophagogastroduodenoscopy (egd) with propofol Left 08/26/2014    Procedure: ESOPHAGOGASTRODUODENOSCOPY (EGD) WITH PROPOFOL;  Surgeon: Arta Silence, MD;  Location: [facility];  Service: Endoscopy;  Laterality: Left;   Family History  Problem Relation Age of Onset  . Hypertension Mother   . Heart disease Mother   . Diabetes Mother   . Thyroid disease Mother   . Congestive Heart Failure Mother   . Breast cancer Maternal Aunt   . Breast cancer Maternal Grandmother   . Colon cancer Maternal Grandfather   . Heart attack Sister   . Heart disease Brother   . Hyperlipidemia Brother   .  Hypertension Brother   . Diabetes Father    History  Substance Use Topics  . Smoking status: Never Smoker   . Smokeless tobacco: Never Used  . Alcohol Use: Yes     Comment: occasion    OB History   Grav Para Term Preterm Abortions TAB SAB Ect Mult Living                 Review of Systems  Constitutional: Negative for fever and chills.  Gastrointestinal: Positive for nausea, vomiting and abdominal pain. Negative for diarrhea and abdominal distention.  Skin: Positive for pallor.  Neurological: Negative for dizziness.  All other systems reviewed and are negative.     Allergies  Hydrocodone and Sulfur  Home Medications   Prior to Admission medications   Medication Sig Start Date End Date Taking? Authorizing Provider  acetaminophen (TYLENOL) 500 MG tablet Take 1,000 mg by mouth every 6 (six) hours as needed for mild pain.   Yes Historical Provider, MD  atorvastatin (LIPITOR) 40 MG tablet Take 1 tablet (40 mg total) by mouth daily. 08/17/14  Yes Josalyn C Funches, MD  dicyclomine (BENTYL) 20 MG tablet Take 1 tablet (20 mg total) by mouth every 8 (eight) hours as needed for spasms. 08/21/14  Yes Hoy Morn, MD  hydrALAZINE (APRESOLINE) 50 MG tablet Take 1 tablet (50 mg total) by mouth 3 (three) times daily. 08/26/14  Yes Nishant Dhungel, MD  insulin aspart protamine- aspart (NOVOLOG MIX 70/30) (70-30) 100  UNIT/ML injection Inject 0.35 mLs (35 Units total) into the skin 2 (two) times daily with a meal. 08/17/14  Yes Josalyn C Funches, MD  lisinopril-hydrochlorothiazide (PRINZIDE,ZESTORETIC) 20-25 MG per tablet Take 1 tablet by mouth daily. 08/08/14  Yes Venetia Maxon Rama, MD  metoCLOPramide (REGLAN) 5 MG tablet Take 1 tablet (5 mg total) by mouth every 8 (eight) hours as needed for nausea or vomiting. 08/26/14  Yes Nishant Dhungel, MD  pantoprazole (PROTONIX) 40 MG tablet Take 1 tablet (40 mg total) by mouth daily. 08/26/14  Yes Nishant Dhungel, MD   BP 178/124  Pulse 108  Temp(Src) 99.3  F (37.4 C) (Oral)  Resp 18  Ht 5\' 3"  (1.6 m)  Wt 179 lb 14.3 oz (81.6 kg)  BMI 31.88 kg/m2  SpO2 100% Physical Exam  Nursing note and vitals reviewed. Constitutional: She appears well-developed and well-nourished.  HENT:  Head: Normocephalic.  Eyes: Pupils are equal, round, and reactive to light.  Neck: Normal range of motion.  Cardiovascular: Normal rate and regular rhythm.   Pulmonary/Chest: Effort normal.  Abdominal: Soft. Bowel sounds are normal. She exhibits no distension. There is no tenderness.  Musculoskeletal: Normal range of motion.  Neurological: She is alert.  Skin: Skin is warm and dry.    ED Course  Procedures (including critical care time) Labs Review Labs Reviewed  CBC WITH DIFFERENTIAL - Abnormal; Notable for the following:    WBC 11.4 (*)    Neutro Abs 8.9 (*)    All other components within normal limits  COMPREHENSIVE METABOLIC PANEL - Abnormal; Notable for the following:    Sodium 136 (*)    Chloride 94 (*)    Glucose, Bld 356 (*)    Total Protein 8.6 (*)    Anion gap 17 (*)    All other components within normal limits  URINALYSIS, ROUTINE W REFLEX MICROSCOPIC - Abnormal; Notable for the following:    Glucose, UA >1000 (*)    Ketones, ur 15 (*)    All other components within normal limits  URINE MICROSCOPIC-ADD ON - Abnormal; Notable for the following:    Squamous Epithelial / LPF FEW (*)    All other components within normal limits  GLUCOSE, CAPILLARY - Abnormal; Notable for the following:    Glucose-Capillary 250 (*)    All other components within normal limits  LIPASE, BLOOD  BASIC METABOLIC PANEL  CBG MONITORING, ED  CBG MONITORING, ED    Imaging Review No results found.   EKG Interpretation None      MDM   Final diagnoses:  Intractable vomiting with nausea, vomiting of unspecified type  Acute esophagitis  Severe uncontrolled hypertension  Type II diabetes mellitus with neurological manifestations, uncontrolled          Garald Balding, NP 09/07/14 2002

## 2014-09-07 NOTE — ED Notes (Signed)
Pt able to drink 4 ounces of ice water without vomiting. Pt reports still feels nauseous.

## 2014-09-07 NOTE — ED Notes (Signed)
Pt. CBG is 235. RN Notified.

## 2014-09-07 NOTE — H&P (Signed)
History and Physical  KWANA RECZEK [id number] DOB: 05/27/1961 DOA: 09/07/2014  Referring physician: Dr. Evelina Bucy, EDP PCP: Minerva Ends, MD  Outpatient Specialists:  1. None  Chief Complaint: Intractable nausea and vomiting.  HPI: Nancy [name] is a 53 y.o. female with history of DM 2, IBS, HTN, HLD, recent hospitalization 9/6-9/10 for similar complaints when she underwent EGD that showed mild distal esophagitis, presented to the ED with above complaints. She states that she was in usual state of health until 3 days ago when she started having several episodes of nausea and vomiting. Initially she did not have abdominal pain but after several episodes of nausea and vomiting, started complaining of diffuse abdominal soreness. Having normal BM. During the last few episodes, noticed small amount of pink/blood in emesis but no coffee-ground. Unable to eat or drink much over the last 2 days. States that she took her insulins until last night. Denies NSAID use. Despite supportive therapy in the ED, continued to be symptomatic and hospitalist admission was requested.   Review of Systems: All systems reviewed and apart from history of presenting illness, are negative.  Past Medical History  Diagnosis Date  . HOH (hard of hearing) 2004   . Fracture of left ankle 1997   . Shingles 2009   . IBS (irritable bowel syndrome) 2002   . Leukopenia 2015   . Anemia 2006  . Diabetes mellitus with neurological manifestation 2006  . Hyperlipidemia 2006  . Hypertension 2006  . Depression 2014    previously on amitryptiline   . Thyroid nodule 2004   Past Surgical History  Procedure Laterality Date  . Abdominal hysterectomy  2005  . Cesarean section  1983   . Esophagogastroduodenoscopy (egd) with propofol Left 08/26/2014    Procedure: ESOPHAGOGASTRODUODENOSCOPY (EGD) WITH PROPOFOL;  Surgeon: Arta Silence, MD;  Location: [facility];  Service: Endoscopy;  Laterality: Left;    Social History:  reports that she has never smoked. She has never used smokeless tobacco. She reports that she drinks alcohol. She reports that she does not use illicit drugs.  Married. Independent of activities of daily living.   Allergies  Allergen Reactions  . Hydrocodone Other (See Comments)    Upset stomach  . Sulfur Hives    Family History  Problem Relation Age of Onset  . Hypertension Mother   . Heart disease Mother   . Diabetes Mother   . Thyroid disease Mother   . Congestive Heart Failure Mother   . Breast cancer Maternal Aunt   . Breast cancer Maternal Grandmother   . Colon cancer Maternal Grandfather   . Heart attack Sister   . Heart disease Brother   . Hyperlipidemia Brother   . Hypertension Brother   . Diabetes Father     Prior to Admission medications   Medication Sig Start Date End Date Taking? Authorizing Provider  acetaminophen (TYLENOL) 500 MG tablet Take 1,000 mg by mouth every 6 (six) hours as needed for mild pain.   Yes Historical Provider, MD  atorvastatin (LIPITOR) 40 MG tablet Take 1 tablet (40 mg total) by mouth daily. 08/17/14  Yes Josalyn C Funches, MD  dicyclomine (BENTYL) 20 MG tablet Take 1 tablet (20 mg total) by mouth every 8 (eight) hours as needed for spasms. 08/21/14  Yes Hoy Morn, MD  hydrALAZINE (APRESOLINE) 50 MG tablet Take 1 tablet (50 mg total) by mouth 3 (three) times daily. 08/26/14  Yes Nishant Dhungel, MD  insulin aspart protamine-  aspart (NOVOLOG MIX 70/30) (70-30) 100 UNIT/ML injection Inject 0.35 mLs (35 Units total) into the skin 2 (two) times daily with a meal. 08/17/14  Yes Josalyn C Funches, MD  lisinopril-hydrochlorothiazide (PRINZIDE,ZESTORETIC) 20-25 MG per tablet Take 1 tablet by mouth daily. 08/08/14  Yes Venetia Maxon Rama, MD  metoCLOPramide (REGLAN) 5 MG tablet Take 1 tablet (5 mg total) by mouth every 8 (eight) hours as needed for nausea or vomiting. 08/26/14  Yes Nishant Dhungel, MD  pantoprazole (PROTONIX) 40 MG tablet  Take 1 tablet (40 mg total) by mouth daily. 08/26/14  Yes Louellen Molder, MD   Physical Exam: Filed Vitals:   09/07/14 0429 09/07/14 0630 09/07/14 0858  BP: 211/113 159/90 188/97  Pulse: 106 107 111  Temp: 97.8 F (36.6 C)    TempSrc: Oral    Resp: 20 20 18   SpO2: 100% 99% 98%     General exam: Moderately built and nourished middle-age female patient, sitting up on the gurney having dry heaves .  Head, eyes and ENT: Nontraumatic and normocephalic. Pupils equally reacting to light and accommodation. Oral mucosa  borderline hydration .  Neck: Supple. No JVD, carotid bruit or thyromegaly.  Lymphatics: No lymphadenopathy.  Respiratory system: Clear to auscultation. No increased work of breathing.  Cardiovascular system: S1 and S2 heard, RRR. No JVD, murmurs, gallops, clicks or pedal edema.  Gastrointestinal system: Abdomen is nondistended, soft. Mild epigastric tenderness without peritoneal signs . Normal bowel sounds heard. No organomegaly or masses appreciated.  Central nervous system: Alert and oriented. No focal neurological deficits.  Extremities: Symmetric 5 x 5 power. Peripheral pulses symmetrically felt.   Skin: No rashes or acute findings.  Musculoskeletal system: Negative exam.  Psychiatry: Pleasant and cooperative.   Labs on Admission:  Basic Metabolic Panel:  Recent Labs Lab 09/07/14 0459  NA 136*  K 3.7  CL 94*  CO2 25  GLUCOSE 356*  BUN 7  CREATININE 0.52  CALCIUM 10.3   Liver Function Tests:  Recent Labs Lab 09/07/14 0459  AST 10  ALT 16  ALKPHOS 76  BILITOT 0.5  PROT 8.6*  ALBUMIN 4.0    Recent Labs Lab 09/07/14 0940  LIPASE 38   No results found for this basename: AMMONIA,  in the last 168 hours CBC:  Recent Labs Lab 09/07/14 0459  WBC 11.4*  NEUTROABS 8.9*  HGB 14.2  HCT 41.0  MCV 81.3  PLT 282   Cardiac Enzymes: No results found for this basename: CKTOTAL, CKMB, CKMBINDEX, TROPONINI,  in the last 168 hours  BNP  (last 3 results) No results found for this basename: PROBNP,  in the last 8760 hours CBG: No results found for this basename: GLUCAP,  in the last 168 hours  Radiological Exams on Admission: No results found.   Assessment/Plan Principal Problem:   Vomiting without nausea, intractable Active Problems:   Type II diabetes mellitus with neurological manifestations, uncontrolled   Severe uncontrolled hypertension   Acute esophagitis   Intractable nausea and vomiting   1. Intractable nausea and vomiting: Unclear etiology. GI had evaluated during previous admission and performed EGD which showed mild distal esophagitis which did not really explain severity of her symptoms. DD-gastroparesis/esophagitis/? Cyclical vomiting versus other etiology. Treat supportively with bowel rest (clear liquids as tolerated), IV PPI, antiemetics and Reglan. Advance diet as tolerated. 2. Uncontrolled type II DM/IDDM: Even though AG is 17, do not believe that she is in DKA. Aggressive IV fluid hydration. We'll change home 70/30 insulin to Lantus and  sliding scale while in hospital. Monitor closely. 3. Accelerated hypertension: Resume home antihypertensives that she has not taken today. When necessary IV hydralazine. Monitor. 4. Mild distal esophagitis: Seen on recent EGD. Management as above. 5. History of hyperlipidemia: Continue statins    Code Status:  Full   Family Communication: Discussed with spouse at bedside  Disposition Plan:  home when medically stable    Time spent:  45 minutes   Serafino Burciaga, MD, FACP, FHM. [facility] Pager [phone]  If 7PM-7AM, please contact night-coverage [email or web address] Password [facility] 09/07/2014, 11:23 AM

## 2014-09-07 NOTE — ED Provider Notes (Signed)
Patient is 53 yo female with chronic gastroparesis, IBS, DM, depression presenting with persistent nausea, vomiting since 6pm last night. Patient actively vomiting in ED. Patient recently admitted for similar complaints 08/22/14-08/26/14. Patient had Ct with signs of duodenitis and possible pancreatitis. Had repeat CT on 08/24/14 without signs of pancreatitis. Patient had EGD 08/26/14 with signs of distal esophagitis without bleeding or ulceration, normal stomach and duodenum ( up to second part). Patient to take protonix and stop NSAIDs.  Physical Exam  BP 188/97  Pulse 111  Temp(Src) 97.8 F (36.6 C) (Oral)  Resp 18  SpO2 98%  Physical Exam  Nursing note and vitals reviewed. Constitutional: She appears well-developed and well-nourished. No distress.  HENT:  Head: Normocephalic and atraumatic.  Dry mucous membranes  Eyes: Conjunctivae and EOM are normal. Right eye exhibits no discharge. Left eye exhibits no discharge.  Cardiovascular: Normal rate, regular rhythm and normal heart sounds.   Pulmonary/Chest: Effort normal and breath sounds normal. No respiratory distress. She has no wheezes.  Abdominal: Soft.  Diffuse abdominal tenderness without rebound or guarding or signs of peritonitis. +BS  Musculoskeletal:  No midline back tenderness, step off or crepitus. No back tenderness. No CVA tenderness.   Neurological: She is alert. She exhibits normal muscle tone. Coordination normal.  Skin: Skin is warm and dry. She is not diaphoretic.    ED Course  Procedures  Meds given in ED:  Medications  promethazine (PHENERGAN) injection 25 mg (not administered)  0.9 %  sodium chloride infusion ( Intravenous Stopped 09/07/14 0929)  metoCLOPramide (REGLAN) injection 10 mg (10 mg Intravenous Given 09/07/14 0456)  diphenhydrAMINE (BENADRYL) injection 12.5 mg (12.5 mg Intravenous Given 09/07/14 0454)  metoCLOPramide (REGLAN) injection 10 mg (10 mg Intravenous Given 09/07/14 0856)  sodium chloride 0.9 %  bolus 1,000 mL (1,000 mLs Intravenous New Bag/Given 09/07/14 0928)  ondansetron (ZOFRAN) injection 4 mg (4 mg Intravenous Given 09/07/14 0946)    New Prescriptions   No medications on file      MDM Intractable nausea and vomiting.  Gastroparesis  Patient with intractable nausea and vomiting. Patient given 2L NS, two doses of reglan, one zofran and one phenergan. Patient feels better and then begins to vomit again. Patient still vomiting in ED. Patient with persistent tachycardia to 111. Patient with dry mucous membranes.  Labs reviewed with signs of dehydration and hyperglycemia. No DKA. Patients nausea and vomiting not controlled in ED. Consult to [facility] who agree with admission.   Discussed all results and patient verbalizes understanding and agrees with plan.  This is a shared patient. This patient was discussed with the physician, Dr. Mingo Amber who saw and evaluated the patient and agrees with the plan.        Pura Spice, PA-C 09/07/14 1201

## 2014-09-07 NOTE — ED Provider Notes (Signed)
Medical screening examination/treatment/procedure(s) were conducted as a shared visit with non-physician practitioner(s) and myself.  I personally evaluated the patient during the encounter.   EKG Interpretation None      Patient here with vomiting, recently had EGD showing esophagitis. GI was concerned for possible gastroparesis. Here will do well, but then have repeat episodes of vomiting. Persistent vomiting despite anti-emetics here, admitted.  Evelina Bucy, MD 09/07/14 [phone]

## 2014-09-07 NOTE — ED Notes (Signed)
MD at bedside. ADMITTING MD PRESENT 

## 2014-09-08 ENCOUNTER — Observation Stay (HOSPITAL_COMMUNITY): Payer: Managed Care, Other (non HMO)

## 2014-09-08 DIAGNOSIS — E876 Hypokalemia: Secondary | ICD-10-CM | POA: Diagnosis present

## 2014-09-08 DIAGNOSIS — Z79899 Other long term (current) drug therapy: Secondary | ICD-10-CM | POA: Diagnosis not present

## 2014-09-08 DIAGNOSIS — Z794 Long term (current) use of insulin: Secondary | ICD-10-CM | POA: Diagnosis not present

## 2014-09-08 DIAGNOSIS — K3184 Gastroparesis: Secondary | ICD-10-CM | POA: Diagnosis present

## 2014-09-08 DIAGNOSIS — I10 Essential (primary) hypertension: Secondary | ICD-10-CM | POA: Diagnosis present

## 2014-09-08 DIAGNOSIS — Z6834 Body mass index (BMI) 34.0-34.9, adult: Secondary | ICD-10-CM | POA: Diagnosis not present

## 2014-09-08 DIAGNOSIS — R112 Nausea with vomiting, unspecified: Secondary | ICD-10-CM | POA: Diagnosis present

## 2014-09-08 DIAGNOSIS — E1149 Type 2 diabetes mellitus with other diabetic neurological complication: Secondary | ICD-10-CM | POA: Diagnosis present

## 2014-09-08 DIAGNOSIS — E669 Obesity, unspecified: Secondary | ICD-10-CM | POA: Diagnosis present

## 2014-09-08 DIAGNOSIS — E785 Hyperlipidemia, unspecified: Secondary | ICD-10-CM | POA: Diagnosis present

## 2014-09-08 DIAGNOSIS — K209 Esophagitis, unspecified without bleeding: Secondary | ICD-10-CM | POA: Diagnosis present

## 2014-09-08 DIAGNOSIS — H9190 Unspecified hearing loss, unspecified ear: Secondary | ICD-10-CM | POA: Diagnosis present

## 2014-09-08 LAB — GLUCOSE, CAPILLARY
GLUCOSE-CAPILLARY: 228 mg/dL — AB (ref 70–99)
GLUCOSE-CAPILLARY: 264 mg/dL — AB (ref 70–99)
Glucose-Capillary: 314 mg/dL — ABNORMAL HIGH (ref 70–99)
Glucose-Capillary: 228 mg/dL — ABNORMAL HIGH (ref 70–99)
Glucose-Capillary: 235 mg/dL — ABNORMAL HIGH (ref 70–99)
Glucose-Capillary: 267 mg/dL — ABNORMAL HIGH (ref 70–99)
Glucose-Capillary: 172 mg/dL — ABNORMAL HIGH (ref 70–99)
Glucose-Capillary: 140 mg/dL — ABNORMAL HIGH (ref 70–99)

## 2014-09-08 LAB — BASIC METABOLIC PANEL
Anion gap: 15 (ref 5–15)
BUN: 7 mg/dL (ref 6–23)
CALCIUM: 9 mg/dL (ref 8.4–10.5)
CO2: 24 meq/L (ref 19–32)
Chloride: 98 mEq/L (ref 96–112)
Creatinine, Ser: 0.49 mg/dL — ABNORMAL LOW (ref 0.50–1.10)
GFR calc non Af Amer: >90 mL/min (ref >90)
Glucose, Bld: 252 mg/dL — ABNORMAL HIGH (ref 70–99)
Potassium: 3.4 mEq/L — ABNORMAL LOW (ref 3.7–5.3)
Sodium: 137 mEq/L (ref 137–147)

## 2014-09-08 MED ORDER — BOOST / RESOURCE BREEZE PO LIQD
1.0000 | Freq: Three times a day (TID) | ORAL | Status: DC
  Administered 2014-09-08 – 2014-09-13 (×6): 1 via ORAL

## 2014-09-08 MED ORDER — INSULIN ASPART 100 UNIT/ML ~~LOC~~ SOLN
0.0000 [IU] | Freq: Three times a day (TID) | SUBCUTANEOUS | Status: DC
  Administered 2014-09-08: 11 [IU] via SUBCUTANEOUS
  Administered 2014-09-08: 4 [IU] via SUBCUTANEOUS
  Administered 2014-09-09 – 2014-09-12 (×6): 3 [IU] via SUBCUTANEOUS
  Administered 2014-09-13 – 2014-09-14 (×2): 4 [IU] via SUBCUTANEOUS
  Administered 2014-09-14: 3 [IU] via SUBCUTANEOUS

## 2014-09-08 MED ORDER — INSULIN GLARGINE 100 UNIT/ML ~~LOC~~ SOLN
40.0000 [IU] | Freq: Every day | SUBCUTANEOUS | Status: DC
  Administered 2014-09-08 – 2014-09-14 (×7): 40 [IU] via SUBCUTANEOUS
  Filled 2014-09-08 (×7): qty 0.4

## 2014-09-08 NOTE — Progress Notes (Signed)
[facility] PROGRESS NOTE  HAILI VANGUNDY [id number] DOB: June 02, 1961 DOA: 09/07/2014 PCP: Minerva Ends, MD  Assessment/Plan: Intractable nausea and vomiting Possibly gastroparesis related to uncontrolled DM. currently  NPO with plan on gastrci emptying study. Continue supportive care with IV fluids, antiemetics and pain control. needs tight glucose control. continue BID PPI.  Uncontrolled type II DM/IDDM:  A1C >13. Continue  IV fluids. On NPH bid at home which needs to be adjusted. currently on lantus.  Accelerated hypertension:  home antihypertensives and  when necessary IV hydralazine. Monitor.  Mild distal esophagitis: Seen on recent EGD. Continue PPI and sucralfate. If GES negative will ask [facility] to reevaluate.   hyperlipidemia:  Continue statins  DVT prophylaxis  Diet: NPO    Code Status: Full  Family Communication: none at bedside  Disposition Plan: home when medically stable     Consultants:  none  Procedures:  gastric emptying study on 9/23  Antibiotics:  none  HPI/Subjective: Reports nausea and 1 episode of vomiting this am. Reports abd pain  Objective: Filed Vitals:   09/08/14 0654  BP: 184/92  Pulse:   Temp:   Resp:    No intake or output data in the 24 hours ending 09/08/14 1352 Filed Weights   09/07/14 1557 09/08/14 0514  Weight: 81.6 kg (179 lb 14.3 oz) 86.8 kg (191 lb 5.8 oz)    Exam:   General: middle aged female in NAD  HEENT: no pallor, dry mucosa  Chest; clear b/l   CVS: NS1&S2  Abd: soft, BS, non distended, epigastric tenderness  Data Reviewed: Basic Metabolic Panel:  Recent Labs Lab 09/07/14 0459 09/08/14 0516  NA 136* 137  K 3.7 3.4*  CL 94* 98  CO2 25 24  GLUCOSE 356* 252*  BUN 7 7  CREATININE 0.52 0.49*  CALCIUM 10.3 9.0   Liver Function Tests:  Recent Labs Lab 09/07/14 0459  AST 10  ALT 16  ALKPHOS 76  BILITOT 0.5  PROT 8.6*  ALBUMIN 4.0    Recent Labs Lab  09/07/14 0940  LIPASE 38   No results found for this basename: AMMONIA,  in the last 168 hours CBC:  Recent Labs Lab 09/07/14 0459  WBC 11.4*  NEUTROABS 8.9*  HGB 14.2  HCT 41.0  MCV 81.3  PLT 282   Cardiac Enzymes: No results found for this basename: CKTOTAL, CKMB, CKMBINDEX, TROPONINI,  in the last 168 hours BNP (last 3 results) No results found for this basename: PROBNP,  in the last 8760 hours CBG:  Recent Labs Lab 09/07/14 1559 09/07/14 1713 09/07/14 2209 09/08/14 0732 09/08/14 1133  GLUCAP 235* 250* 228* 264* 267*    No results found for this or any previous visit (from the past 240 hour(s)).   Studies: No results found.  Scheduled Meds: . atorvastatin  40 mg Oral Daily  . feeding supplement (RESOURCE BREEZE)  1 Container Oral TID BM  . hydrALAZINE  50 mg Oral TID  . insulin aspart  0-20 Units Subcutaneous TID WC  . insulin aspart  0-5 Units Subcutaneous QHS  . insulin glargine  40 Units Subcutaneous Daily  . lisinopril  20 mg Oral Daily  . metoCLOPramide (REGLAN) injection  5 mg Intravenous 4 times per day  . pantoprazole (PROTONIX) IV  40 mg Intravenous Q12H  . sucralfate  1 g Oral TID WC & HS   Continuous Infusions: . sodium chloride 150 mL/hr at 09/08/14 0903     Time spent: 25 minutes  Louellen Molder  [facility] Pager [phone]. If 7PM-7AM, please contact night-coverage at [email or web address], password [facility] 09/08/2014, 1:52 PM  LOS: 1 day

## 2014-09-08 NOTE — Progress Notes (Signed)
UR completed 

## 2014-09-08 NOTE — Progress Notes (Signed)
INITIAL NUTRITION ASSESSMENT  DOCUMENTATION CODES Per approved criteria  -Obesity Unspecified   INTERVENTION: - Diet advancement per MD - Resource Breeze TID - RD to continue to monitor   NUTRITION DIAGNOSIS: Inadequate oral intake related to clear liquid diet as evidenced by diet order.   Goal: Advance diet as tolerated to diabetic diet  Monitor:  Weights, labs, diet advancement, vomiting  Reason for Assessment: Malnutrition screening tool   53 y.o. female  Admitting Dx: Vomiting without nausea, intractable  ASSESSMENT: Pt with history of DM 2, IBS, HTN, HLD, recent hospitalization 9/6-9/10 for similar complaints when she underwent EGD that showed mild distal esophagitis, presented to the ED with intractable nausea and vomiting. She states that she was in usual state of health until 3 days ago when she started having several episodes of nausea and vomiting. Initially she did not have abdominal pain but after several episodes of nausea and vomiting, started complaining of diffuse abdominal soreness. Having normal BM. During the last few episodes, noticed small amount of pink/blood in emesis but no coffee-ground. Unable to eat or drink much over the last 2 days.   - Attempted to talk with pt however pt had her head in her hands leaning on bedside table and did not look up, appeared uncomfortable, and was asking for Phenergan - RN entered quickly after RD entered  Potassium slightly low   Height: Ht Readings from Last 1 Encounters:  09/07/14 5\' 3"  (1.6 m)    Weight: Wt Readings from Last 1 Encounters:  09/08/14 191 lb 5.8 oz (86.8 kg)    Ideal Body Weight: 115 lbs   % Ideal Body Weight: 166%  Wt Readings from Last 10 Encounters:  09/08/14 191 lb 5.8 oz (86.8 kg)  08/25/14 185 lb (83.915 kg)  08/25/14 185 lb (83.915 kg)  08/17/14 192 lb 12.8 oz (87.454 kg)  08/04/14 196 lb (88.905 kg)  11/17/13 192 lb (87.091 kg)    Usual Body Weight: 185 lbs earlier this  month  % Usual Body Weight: 103%  BMI:  Body mass index is 33.91 kg/(m^2). Class I obesity   Estimated Nutritional Needs: Kcal: 1500-1700 Protein: 60-75g Fluid: 1.5-1.7L/day   Skin: intact   Diet Order: Clear Liquid  EDUCATION NEEDS: -No education needs identified at this time  No intake or output data in the 24 hours ending 09/08/14 1253  Last BM: 9/15  Labs:   Recent Labs Lab 09/07/14 0459 09/08/14 0516  NA 136* 137  K 3.7 3.4*  CL 94* 98  CO2 25 24  BUN 7 7  CREATININE 0.52 0.49*  CALCIUM 10.3 9.0  GLUCOSE 356* 252*    CBG (last 3)   Recent Labs  09/07/14 2209 09/08/14 0732 09/08/14 1133  GLUCAP 228* 264* 267*    Scheduled Meds: . atorvastatin  40 mg Oral Daily  . hydrALAZINE  50 mg Oral TID  . insulin aspart  0-20 Units Subcutaneous TID WC  . insulin aspart  0-5 Units Subcutaneous QHS  . insulin glargine  40 Units Subcutaneous Daily  . lisinopril  20 mg Oral Daily  . metoCLOPramide (REGLAN) injection  5 mg Intravenous 4 times per day  . pantoprazole (PROTONIX) IV  40 mg Intravenous Q12H  . sucralfate  1 g Oral TID WC & HS    Continuous Infusions: . sodium chloride 150 mL/hr at 09/08/14 [id number]    Past Medical History  Diagnosis Date  . HOH (hard of hearing) 2004   . Fracture of left ankle  1997   . Shingles 2009   . IBS (irritable bowel syndrome) 2002   . Leukopenia 2015   . Anemia 2006  . Diabetes mellitus with neurological manifestation 2006  . Hyperlipidemia 2006  . Hypertension 2006  . Depression 2014    previously on amitryptiline   . Thyroid nodule 2004    Past Surgical History  Procedure Laterality Date  . Abdominal hysterectomy  2005  . Cesarean section  1983   . Esophagogastroduodenoscopy (egd) with propofol Left 08/26/2014    Procedure: ESOPHAGOGASTRODUODENOSCOPY (EGD) WITH PROPOFOL;  Surgeon: Arta Silence, MD;  Location: [facility];  Service: Endoscopy;  Laterality: Left;    Carlis Stable MS, [place], [place]  Pager [phone] Weekend/After Hours Pager

## 2014-09-09 LAB — BASIC METABOLIC PANEL
Anion gap: 15 (ref 5–15)
BUN: 11 mg/dL (ref 6–23)
CHLORIDE: 100 meq/L (ref 96–112)
CO2: 24 meq/L (ref 19–32)
CREATININE: 0.58 mg/dL (ref 0.50–1.10)
Calcium: 8.9 mg/dL (ref 8.4–10.5)
GFR calc Af Amer: >90 mL/min (ref >90)
GFR calc non Af Amer: >90 mL/min (ref >90)
Glucose, Bld: 129 mg/dL — ABNORMAL HIGH (ref 70–99)
Potassium: 3.2 mEq/L — ABNORMAL LOW (ref 3.7–5.3)
Sodium: 139 mEq/L (ref 137–147)

## 2014-09-09 LAB — GLUCOSE, CAPILLARY
GLUCOSE-CAPILLARY: 101 mg/dL — AB (ref 70–99)
Glucose-Capillary: 145 mg/dL — ABNORMAL HIGH (ref 70–99)
Glucose-Capillary: 123 mg/dL — ABNORMAL HIGH (ref 70–99)
Glucose-Capillary: 100 mg/dL — ABNORMAL HIGH (ref 70–99)

## 2014-09-09 MED ORDER — HYDRALAZINE HCL 50 MG PO TABS
100.0000 mg | ORAL_TABLET | Freq: Three times a day (TID) | ORAL | Status: DC
  Administered 2014-09-09 – 2014-09-14 (×16): 100 mg via ORAL
  Filled 2014-09-09 (×18): qty 2

## 2014-09-09 MED ORDER — LISINOPRIL 40 MG PO TABS
40.0000 mg | ORAL_TABLET | Freq: Every day | ORAL | Status: DC
  Administered 2014-09-09 – 2014-09-14 (×4): 40 mg via ORAL
  Filled 2014-09-09 (×6): qty 1

## 2014-09-09 MED ORDER — METOCLOPRAMIDE HCL 5 MG/ML IJ SOLN
10.0000 mg | Freq: Four times a day (QID) | INTRAMUSCULAR | Status: DC
  Administered 2014-09-09 – 2014-09-14 (×20): 10 mg via INTRAVENOUS
  Filled 2014-09-09 (×23): qty 2

## 2014-09-09 MED ORDER — POTASSIUM CHLORIDE CRYS ER 20 MEQ PO TBCR
40.0000 meq | EXTENDED_RELEASE_TABLET | Freq: Once | ORAL | Status: AC
  Administered 2014-09-09: 40 meq via ORAL
  Filled 2014-09-09: qty 2

## 2014-09-09 NOTE — Progress Notes (Signed)
[facility] PROGRESS NOTE  SHAKA TANEY [id number] DOB: 10-25-1961 DOA: 09/07/2014 PCP: Minerva Ends, MD  Assessment/Plan: Intractable nausea and vomiting Possibly gastroparesis related to uncontrolled DM. Marland Kitchen Continue supportive care with IV fluids, antiemetics and pain control. needs tight glucose control. continue BID PPI. Patient unable to tolerate gastric emptying study due to ongoing nausea. Have increased scheduled dose of Reglan and possibly perform the study in the morning.   Uncontrolled type II DM/IDDM:  A1C >13. Continue  IV fluids. On NPH bid at home which needs to be adjusted once able to take by mouth. currently on lantus.  Accelerated hypertension:  home antihypertensives and  when necessary IV hydralazine. Monitor.  Mild distal esophagitis:  Seen on recent EGD. Continue PPI and sucralfate. If GES negative will ask [facility] to reevaluate.   hyperlipidemia:  Continue statins  Hypokalemia replenish  DVT prophylaxis  Diet: NPO    Code Status: Full  Family Communication: none at bedside  Disposition Plan: home when medically stable     Consultants:  none  Procedures:  gastric emptying study (pending)  Antibiotics:  none  HPI/Subjective: Patient still nauseous and had a small episode of vomiting this morning. Still has epigastric and right upper quadrant pain  Objective: Filed Vitals:   09/09/14 1351  BP: 177/83  Pulse: 110  Temp: 99 F (37.2 C)  Resp: 16    Intake/Output Summary (Last 24 hours) at 09/09/14 1519 Last data filed at 09/09/14 1300  Gross per 24 hour  Intake    140 ml  Output      0 ml  Net    140 ml   Filed Weights   09/07/14 1557 09/08/14 0514  Weight: 81.6 kg (179 lb 14.3 oz) 86.8 kg (191 lb 5.8 oz)    Exam:   General: middle aged female in NAD  HEENT:  dry mucosa  Chest; clear b/l   CVS: NS1&S2  Abd: soft, BS, non distended, tender to pressure over the epigastric and right upper  quadrant  Extremities: Warm, no edema  Data Reviewed: Basic Metabolic Panel:  Recent Labs Lab 09/07/14 0459 09/08/14 0516 09/09/14 0515  NA 136* 137 139  K 3.7 3.4* 3.2*  CL 94* 98 100  CO2 25 24 24   GLUCOSE 356* 252* 129*  BUN 7 7 11   CREATININE 0.52 0.49* 0.58  CALCIUM 10.3 9.0 8.9   Liver Function Tests:  Recent Labs Lab 09/07/14 0459  AST 10  ALT 16  ALKPHOS 76  BILITOT 0.5  PROT 8.6*  ALBUMIN 4.0    Recent Labs Lab 09/07/14 0940  LIPASE 38   No results found for this basename: AMMONIA,  in the last 168 hours CBC:  Recent Labs Lab 09/07/14 0459  WBC 11.4*  NEUTROABS 8.9*  HGB 14.2  HCT 41.0  MCV 81.3  PLT 282   Cardiac Enzymes: No results found for this basename: CKTOTAL, CKMB, CKMBINDEX, TROPONINI,  in the last 168 hours BNP (last 3 results) No results found for this basename: PROBNP,  in the last 8760 hours CBG:  Recent Labs Lab 09/08/14 1133 09/08/14 1615 09/08/14 2150 09/09/14 0745 09/09/14 1204  GLUCAP 267* 172* 140* 145* 123*    No results found for this or any previous visit (from the past 240 hour(s)).   Studies: No results found.  Scheduled Meds: . atorvastatin  40 mg Oral Daily  . feeding supplement (RESOURCE BREEZE)  1 Container Oral TID BM  . hydrALAZINE  100 mg Oral  TID  . insulin aspart  0-20 Units Subcutaneous TID WC  . insulin aspart  0-5 Units Subcutaneous QHS  . insulin glargine  40 Units Subcutaneous Daily  . lisinopril  40 mg Oral Daily  . metoCLOPramide (REGLAN) injection  10 mg Intravenous 4 times per day  . pantoprazole (PROTONIX) IV  40 mg Intravenous Q12H  . sucralfate  1 g Oral TID WC & HS   Continuous Infusions:     Time spent: 25 minutes    Skyla Champagne, [place]  [facility] Pager [phone]. If 7PM-7AM, please contact night-coverage at [email or web address], password [facility] 09/09/2014, 3:19 PM  LOS: 2 days

## 2014-09-10 ENCOUNTER — Inpatient Hospital Stay (HOSPITAL_COMMUNITY): Payer: Managed Care, Other (non HMO)

## 2014-09-10 LAB — HEPATIC FUNCTION PANEL
ALT: 12 U/L (ref 0–35)
AST: 15 U/L (ref 0–37)
Albumin: 3.5 g/dL (ref 3.5–5.2)
Alkaline Phosphatase: 57 U/L (ref 39–117)
Bilirubin, Direct: <0.2 mg/dL (ref 0.0–0.3)
TOTAL PROTEIN: 7.3 g/dL (ref 6.0–8.3)
Total Bilirubin: 0.6 mg/dL (ref 0.3–1.2)

## 2014-09-10 LAB — GLUCOSE, CAPILLARY
GLUCOSE-CAPILLARY: 111 mg/dL — AB (ref 70–99)
GLUCOSE-CAPILLARY: 126 mg/dL — AB (ref 70–99)
GLUCOSE-CAPILLARY: 94 mg/dL (ref 70–99)
Glucose-Capillary: 94 mg/dL (ref 70–99)

## 2014-09-10 LAB — BASIC METABOLIC PANEL
Anion gap: 15 (ref 5–15)
BUN: 10 mg/dL (ref 6–23)
CHLORIDE: 99 meq/L (ref 96–112)
CO2: 25 mEq/L (ref 19–32)
Calcium: 9.2 mg/dL (ref 8.4–10.5)
Creatinine, Ser: 0.61 mg/dL (ref 0.50–1.10)
GFR calc Af Amer: >90 mL/min (ref >90)
Glucose, Bld: 97 mg/dL (ref 70–99)
Potassium: 3.1 mEq/L — ABNORMAL LOW (ref 3.7–5.3)
Sodium: 139 mEq/L (ref 137–147)

## 2014-09-10 LAB — MAGNESIUM: MAGNESIUM: 1.9 mg/dL (ref 1.5–2.5)

## 2014-09-10 MED ORDER — POTASSIUM CHLORIDE 10 MEQ/100ML IV SOLN
10.0000 meq | INTRAVENOUS | Status: AC
  Administered 2014-09-10 (×4): 10 meq via INTRAVENOUS
  Filled 2014-09-10 (×4): qty 100

## 2014-09-10 MED ORDER — DIPHENHYDRAMINE HCL 50 MG/ML IJ SOLN: 12.5000 mg | Freq: Four times a day (QID) | INTRAMUSCULAR | Status: DC | PRN

## 2014-09-10 NOTE — Progress Notes (Signed)
Pt BP 200/97, HR 113.  Pt complains of headache.  No complaints of chest pain.  Adm scheduled hydralazine 100 mg po.  Notified MD.  Will continue to monitor.

## 2014-09-10 NOTE — Progress Notes (Signed)
[facility] PROGRESS NOTE  Nancy [name] [id number] DOB: 1961/02/12 DOA: 09/07/2014 PCP: Minerva Ends, MD   Brief narrative 53 year old female with uncontrolled type 2 diabetes mellitus, diabetes, hypertension, hyperlipidemia, and recent hospitalization for intractable nausea and vomiting for which he underwent EGD which showed mild distal esophagitis returned to the ED with similar symptoms.    Assessment/Plan: Intractable nausea and vomiting Possibly gastroparesis related to uncontrolled DM. Marland Kitchen Continue supportive care with IV fluids, antiemetics and pain control. FSG better controlled. continue BID PPI. Patient unable to tolerate gastric emptying study due to ongoing nausea.  increased scheduled dose of Reglan . Patient feels she can tolerate the study today. -Patient has increased right upper ordered abdominal pain. LFTs unremarkable. Will check liver ultrasound.  Uncontrolled type II DM/IDDM:  A1C >13. Continue  IV fluids. On NPH bid at home which needs to be adjusted once able to take by mouth. currently on lantus. FSG controlled.  Accelerated hypertension:  home antihypertensives and  when necessary IV hydralazine. Increased home dose hydralazine 200 mg 2 times a day.  Mild distal esophagitis:  Seen on recent EGD. Continue PPI and sucralfate. If GES negative will ask [facility] to reevaluate.   hyperlipidemia:  Continue statins  Hypokalemia replenish with IV KCl. Magnesium of 1.9  DVT prophylaxis  Diet: NPO    Code Status: Full  Family Communication: none at bedside  Disposition Plan: home when medically stable     Consultants:  none  Procedures:  gastric emptying study (pending)  Ultrasound abdomen  Antibiotics:  none  HPI/Subjective: Patient still nauseous but no further vomiting. Reports right upper quadrant pain   Objective: Filed Vitals:   09/10/14 0639  BP: 157/85  Pulse:   Temp:   Resp:     Intake/Output Summary  (Last 24 hours) at 09/10/14 1056 Last data filed at 09/09/14 1300  Gross per 24 hour  Intake     30 ml  Output      0 ml  Net     30 ml   Filed Weights   09/07/14 1557 09/08/14 0514 09/09/14 1351  Weight: 81.6 kg (179 lb 14.3 oz) 86.8 kg (191 lb 5.8 oz) 86.5 kg (190 lb 11.2 oz)    Exam:   General: middle aged female in NAD  HEENT:  dry mucosa  Chest; clear b/l   CVS: NS1&S2  Abd: soft, BS, non distended, right upper quadrant tenderness   Extremities: Warm, no edema  Data Reviewed: Basic Metabolic Panel:  Recent Labs Lab 09/07/14 0459 09/08/14 0516 09/09/14 0515 09/10/14 0512  NA 136* 137 139 139  K 3.7 3.4* 3.2* 3.1*  CL 94* 98 100 99  CO2 25 24 24 25   GLUCOSE 356* 252* 129* 97  BUN 7 7 11 10   CREATININE 0.52 0.49* 0.58 0.61  CALCIUM 10.3 9.0 8.9 9.2  MG  --   --   --  1.9   Liver Function Tests:  Recent Labs Lab 09/07/14 0459 09/10/14 0512  AST 10 15  ALT 16 12  ALKPHOS 76 57  BILITOT 0.5 0.6  PROT 8.6* 7.3  ALBUMIN 4.0 3.5    Recent Labs Lab 09/07/14 0940  LIPASE 38   No results found for this basename: AMMONIA,  in the last 168 hours CBC:  Recent Labs Lab 09/07/14 0459  WBC 11.4*  NEUTROABS 8.9*  HGB 14.2  HCT 41.0  MCV 81.3  PLT 282   Cardiac Enzymes: No results found for this basename: CKTOTAL,  CKMB, CKMBINDEX, TROPONINI,  in the last 168 hours BNP (last 3 results) No results found for this basename: PROBNP,  in the last 8760 hours CBG:  Recent Labs Lab 09/09/14 0745 09/09/14 1204 09/09/14 1636 09/09/14 2150 09/10/14 0752  GLUCAP 145* 123* 101* 100* 94    No results found for this or any previous visit (from the past 240 hour(s)).   Studies: No results found.  Scheduled Meds: . atorvastatin  40 mg Oral Daily  . feeding supplement (RESOURCE BREEZE)  1 Container Oral TID BM  . hydrALAZINE  100 mg Oral TID  . insulin aspart  0-20 Units Subcutaneous TID WC  . insulin aspart  0-5 Units Subcutaneous QHS  . insulin  glargine  40 Units Subcutaneous Daily  . lisinopril  40 mg Oral Daily  . metoCLOPramide (REGLAN) injection  10 mg Intravenous 4 times per day  . pantoprazole (PROTONIX) IV  40 mg Intravenous Q12H  . potassium chloride  10 mEq Intravenous Q1 Hr x 4  . sucralfate  1 g Oral TID WC & HS   Continuous Infusions:     Time spent: 25 minutes    Tajana Crotteau, [place]  [facility] Pager [phone]. If 7PM-7AM, please contact night-coverage at [email or web address], password [facility] 09/10/2014, 10:56 AM  LOS: 3 days

## 2014-09-11 LAB — GLUCOSE, CAPILLARY
GLUCOSE-CAPILLARY: 82 mg/dL (ref 70–99)
GLUCOSE-CAPILLARY: 105 mg/dL — AB (ref 70–99)
Glucose-Capillary: 112 mg/dL — ABNORMAL HIGH (ref 70–99)
Glucose-Capillary: 162 mg/dL — ABNORMAL HIGH (ref 70–99)

## 2014-09-11 LAB — BASIC METABOLIC PANEL
Anion gap: 13 (ref 5–15)
BUN: 10 mg/dL (ref 6–23)
CALCIUM: 8.9 mg/dL (ref 8.4–10.5)
CO2: 26 mEq/L (ref 19–32)
Chloride: 99 mEq/L (ref 96–112)
Creatinine, Ser: 0.66 mg/dL (ref 0.50–1.10)
GFR calc Af Amer: >90 mL/min (ref >90)
Glucose, Bld: 76 mg/dL (ref 70–99)
POTASSIUM: 3.2 meq/L — AB (ref 3.7–5.3)
SODIUM: 138 meq/L (ref 137–147)

## 2014-09-11 MED ORDER — AMLODIPINE BESYLATE 5 MG PO TABS
5.0000 mg | ORAL_TABLET | Freq: Every day | ORAL | Status: DC
  Administered 2014-09-11: 5 mg via ORAL
  Filled 2014-09-11 (×2): qty 1

## 2014-09-11 MED ORDER — POTASSIUM CHLORIDE CRYS ER 20 MEQ PO TBCR
40.0000 meq | EXTENDED_RELEASE_TABLET | ORAL | Status: AC
Start: 2014-09-11 — End: 2014-09-11
  Administered 2014-09-11 (×2): 40 meq via ORAL
  Filled 2014-09-11 (×2): qty 2

## 2014-09-11 NOTE — Progress Notes (Addendum)
[facility] PROGRESS NOTE  Nancy [name] [id number] DOB: 1961-07-20 DOA: 09/07/2014 PCP: Minerva Ends, MD   Brief narrative 53 year old female with uncontrolled type 2 diabetes mellitus, diabetes, hypertension, hyperlipidemia, and recent hospitalization for intractable nausea and vomiting for which he underwent EGD which showed mild distal esophagitis returned to the ED with similar symptoms.    Assessment/Plan: Intractable nausea and vomiting Possibly gastroparesis related to uncontrolled DM. Marland Kitchen Continue supportive care with antiemetics and pain control. FSG better controlled. continue BID PPI. Patient unable to tolerate gastric emptying study due to ongoing nausea and being NPO. As per radiologist she needs to eb able to tolerate food for the test. .  increased scheduled dose of Reglan .  -test scheduled for 9/28 Advance  to soft diet   abdominal pain  secondary to gastroparesis. [place] abd done given RUQ pain and shows GB sludge without inflammation or obstruction. LFTs normal.   Uncontrolled type II DM/IDDM:  A1C >13. Continue  IV fluids. On NPH bid at home which needs to be adjusted once able to take by mouth. currently on lantus. FSG controlled.  Accelerated hypertension:  home antihypertensives and  when necessary IV hydralazine. Increased home dose hydralazine to 100mg  3 times a day. Add norvasc 5 mg daily  Mild distal esophagitis:  Seen on recent EGD. Continue PPI and sucralfate. If GES negative will ask [facility] to reevaluate.   hyperlipidemia:  Continue statins  Hypokalemia replenish with KCl.  DVT prophylaxis  Diet: NPO    Code Status: Full  Family Communication: none at bedside  Disposition Plan: home when medically stable     Consultants:  none  Procedures:  gastric emptying study (pending)  Ultrasound abdomen  Antibiotics:  none  HPI/Subjective: Nausea and abd pain better this am. Wants to advance diet  Objective: Filed  Vitals:   09/11/14 0604  BP: 173/92  Pulse: 91  Temp: 100.3 F (37.9 C)  Resp: 18    Intake/Output Summary (Last 24 hours) at 09/11/14 1052 Last data filed at 09/11/14 0738  Gross per 24 hour  Intake    620 ml  Output      0 ml  Net    620 ml   Filed Weights   09/09/14 1351 09/10/14 1538 09/11/14 0604  Weight: 86.5 kg (190 lb 11.2 oz) 83.734 kg (184 lb 9.6 oz) 87.9 kg (193 lb 12.6 oz)    Exam:   General:  NAD  HEENT: moist  mucosa  Chest; clear b/l   CVS: NS1&S2  Abd: soft, BS, non distended, right upper quadrant tenderness   Extremities: Warm, no edema  Data Reviewed: Basic Metabolic Panel:  Recent Labs Lab 09/07/14 0459 09/08/14 0516 09/09/14 0515 09/10/14 0512 09/11/14 0527  NA 136* 137 139 139 138  K 3.7 3.4* 3.2* 3.1* 3.2*  CL 94* 98 100 99 99  CO2 25 24 24 25 26   GLUCOSE 356* 252* 129* 97 76  BUN 7 7 11 10 10   CREATININE 0.52 0.49* 0.58 0.61 0.66  CALCIUM 10.3 9.0 8.9 9.2 8.9  MG  --   --   --  1.9  --    Liver Function Tests:  Recent Labs Lab 09/07/14 0459 09/10/14 0512  AST 10 15  ALT 16 12  ALKPHOS 76 57  BILITOT 0.5 0.6  PROT 8.6* 7.3  ALBUMIN 4.0 3.5    Recent Labs Lab 09/07/14 0940  LIPASE 38   No results found for this basename: AMMONIA,  in the  last 168 hours CBC:  Recent Labs Lab 09/07/14 0459  WBC 11.4*  NEUTROABS 8.9*  HGB 14.2  HCT 41.0  MCV 81.3  PLT 282   Cardiac Enzymes: No results found for this basename: CKTOTAL, CKMB, CKMBINDEX, TROPONINI,  in the last 168 hours BNP (last 3 results) No results found for this basename: PROBNP,  in the last 8760 hours CBG:  Recent Labs Lab 09/10/14 0752 09/10/14 1153 09/10/14 1651 09/10/14 2127 09/11/14 0728  GLUCAP 94 111* 126* 94 82    No results found for this or any previous visit (from the past 240 hour(s)).   Studies: US Abdomen Complete  09/10/2014   CLINICAL DATA:  Right upper quadrant pain.  Nausea and vomiting  EXAM: ULTRASOUND ABDOMEN COMPLETE   COMPARISON:  08/24/2014  FINDINGS: Gallbladder:  Sludge noted within the gallbladder. No stones or wall thickening identified. Negative sonographic Murphy's sign.  Common bile duct:  Diameter: 5 mm.  Liver:  No focal lesion identified. Within normal limits in parenchymal echogenicity.  IVC:  No abnormality visualized.  Pancreas:  Visualized portion unremarkable.  Spleen:  Size and appearance within normal limits.  Right Kidney:  Length: 11.4 cm. Echogenicity within normal limits. No mass or hydronephrosis visualized.  Left Kidney:  Length: 10.7 cm. Echogenicity within normal limits. No mass or hydronephrosis visualized.  Abdominal aorta:  No aneurysm visualized.  Other findings:  None.  IMPRESSION: 1.  Gallbladder sludge  2.  No acute findings.   Electronically Signed   By: Kerby Moors M.D.   On: 09/10/2014 11:45    Scheduled Meds: . atorvastatin  40 mg Oral Daily  . feeding supplement (RESOURCE BREEZE)  1 Container Oral TID BM  . hydrALAZINE  100 mg Oral TID  . insulin aspart  0-20 Units Subcutaneous TID WC  . insulin aspart  0-5 Units Subcutaneous QHS  . insulin glargine  40 Units Subcutaneous Daily  . lisinopril  40 mg Oral Daily  . metoCLOPramide (REGLAN) injection  10 mg Intravenous 4 times per day  . pantoprazole (PROTONIX) IV  40 mg Intravenous Q12H  . potassium chloride  40 mEq Oral Q4H  . sucralfate  1 g Oral TID WC & HS   Continuous Infusions:     Time spent: 25 minutes    [name], [place]  [facility] Pager [phone]. If 7PM-7AM, please contact night-coverage at [email or web address], password [facility] 09/11/2014, 10:52 AM  LOS: 4 days

## 2014-09-12 LAB — BASIC METABOLIC PANEL
ANION GAP: 14 (ref 5–15)
BUN: 7 mg/dL (ref 6–23)
CO2: 25 meq/L (ref 19–32)
Calcium: 9.2 mg/dL (ref 8.4–10.5)
Chloride: 101 mEq/L (ref 96–112)
Creatinine, Ser: 0.67 mg/dL (ref 0.50–1.10)
GFR calc non Af Amer: >90 mL/min (ref >90)
Glucose, Bld: 78 mg/dL (ref 70–99)
POTASSIUM: 3.2 meq/L — AB (ref 3.7–5.3)
Sodium: 140 mEq/L (ref 137–147)

## 2014-09-12 LAB — GLUCOSE, CAPILLARY
GLUCOSE-CAPILLARY: 81 mg/dL (ref 70–99)
GLUCOSE-CAPILLARY: 198 mg/dL — AB (ref 70–99)
Glucose-Capillary: 139 mg/dL — ABNORMAL HIGH (ref 70–99)
Glucose-Capillary: 141 mg/dL — ABNORMAL HIGH (ref 70–99)
Glucose-Capillary: 149 mg/dL — ABNORMAL HIGH (ref 70–99)

## 2014-09-12 MED ORDER — POTASSIUM CHLORIDE 20 MEQ/15ML (10%) PO LIQD
40.0000 meq | Freq: Once | ORAL | Status: AC
  Administered 2014-09-12: 40 meq via ORAL
  Filled 2014-09-12: qty 30

## 2014-09-12 MED ORDER — AMLODIPINE BESYLATE 10 MG PO TABS
10.0000 mg | ORAL_TABLET | Freq: Every day | ORAL | Status: DC
  Administered 2014-09-12 – 2014-09-14 (×3): 10 mg via ORAL
  Filled 2014-09-12 (×3): qty 1

## 2014-09-12 MED ORDER — LORAZEPAM 0.5 MG PO TABS
0.5000 mg | ORAL_TABLET | Freq: Once | ORAL | Status: AC
  Administered 2014-09-12: 0.5 mg via ORAL
  Filled 2014-09-12: qty 1

## 2014-09-12 NOTE — Progress Notes (Signed)
[facility] PROGRESS NOTE  Nancy [name] [id number] DOB: 1961-10-06 DOA: 09/07/2014 PCP: Nancy Ends, MD   Brief narrative 53 year old female with uncontrolled type 2 diabetes mellitus, diabetes, hypertension, hyperlipidemia, and recent hospitalization for intractable nausea and vomiting for which he underwent EGD which showed mild distal esophagitis returned to the ED with similar symptoms.    Assessment/Plan: Intractable nausea and vomiting Possibly gastroparesis related to uncontrolled DM. Prolonged hospital stay due to ongoing symptoms. supportive care with antiemetics and pain control. continue BID PPI. Patient unable to tolerate gastric emptying study due to ongoing nausea .  -symptoms improving with increased dose of scheduled reglan and better blood sugar control. Was able to tolerate soft diet with some eggs this am. Has minimal nausea and abdominal pain. possibly be able to tolerate GES in am. NPO after midnight.    abdominal pain  secondary to gastroparesis. [place] abd done given RUQ pain and shows GB sludge without inflammation or obstruction. LFTs normal. Symptoms improved today.  Uncontrolled type II DM/IDDM:  A1C >13. Marland Kitchen On NPH bid at home which needs to be adjusted once able to take by mouth. currently on lantus. FSG controlled.  Accelerated hypertension:   Increased home dose hydralazine to 100mg  3 times a day. Added norvasc and dose increased to 10 mg daily  Mild distal esophagitis:  Seen on recent EGD. Continue PPI and sucralfate. If GES negative will ask [facility] to reevaluate.   hyperlipidemia:  Continue statins  Hypokalemia replenishing with KCl.  DVT prophylaxis  Diet: soft. NPO after midnight    Code Status: Full  Family Communication: none at bedside  Disposition Plan: home when medically stable     Consultants:  none  Procedures:  gastric emptying study (pending)  Ultrasound  abdomen  Antibiotics:  none  HPI/Subjective: Nausea and abd pain much  better this am. Tolerating soft diet. Also has been ambulating in the hallway  Objective: Filed Vitals:   09/12/14 0514  BP: 151/77  Pulse: 84  Temp: 98.2 F (36.8 C)  Resp: 16    Intake/Output Summary (Last 24 hours) at 09/12/14 1014 Last data filed at 09/11/14 1811  Gross per 24 hour  Intake    480 ml  Output      0 ml  Net    480 ml   Filed Weights   09/10/14 1538 09/11/14 0604 09/12/14 0500  Weight: 83.734 kg (184 lb 9.6 oz) 87.9 kg (193 lb 12.6 oz) 83.462 kg (184 lb)    Exam:   General:  NAD  HEENT: moist  mucosa  Chest; clear b/l   CVS: NS1&S2  Abd: soft, BS, non distended, minimal  right upper quadrant tenderness   Extremities: Warm, no edema  Data Reviewed: Basic Metabolic Panel:  Recent Labs Lab 09/08/14 0516 09/09/14 0515 09/10/14 0512 09/11/14 0527 09/12/14 0530  NA 137 139 139 138 140  K 3.4* 3.2* 3.1* 3.2* 3.2*  CL 98 100 99 99 101  CO2 24 24 25 26 25   GLUCOSE 252* 129* 97 76 78  BUN 7 11 10 10 7   CREATININE 0.49* 0.58 0.61 0.66 0.67  CALCIUM 9.0 8.9 9.2 8.9 9.2  MG  --   --  1.9  --   --    Liver Function Tests:  Recent Labs Lab 09/07/14 0459 09/10/14 0512  AST 10 15  ALT 16 12  ALKPHOS 76 57  BILITOT 0.5 0.6  PROT 8.6* 7.3  ALBUMIN 4.0 3.5    Recent Labs  Lab 09/07/14 0940  LIPASE 38   No results found for this basename: AMMONIA,  in the last 168 hours CBC:  Recent Labs Lab 09/07/14 0459  WBC 11.4*  NEUTROABS 8.9*  HGB 14.2  HCT 41.0  MCV 81.3  PLT 282   Cardiac Enzymes: No results found for this basename: CKTOTAL, CKMB, CKMBINDEX, TROPONINI,  in the last 168 hours BNP (last 3 results) No results found for this basename: PROBNP,  in the last 8760 hours CBG:  Recent Labs Lab 09/11/14 1135 09/11/14 1629 09/11/14 2103 09/12/14 0727 09/12/14 0814  GLUCAP 112* 105* 162* 81 139*    No results found for this or any previous  visit (from the past 240 hour(s)).   Studies: US Abdomen Complete  09/10/2014   CLINICAL DATA:  Right upper quadrant pain.  Nausea and vomiting  EXAM: ULTRASOUND ABDOMEN COMPLETE  COMPARISON:  08/24/2014  FINDINGS: Gallbladder:  Sludge noted within the gallbladder. No stones or wall thickening identified. Negative sonographic Murphy's sign.  Common bile duct:  Diameter: 5 mm.  Liver:  No focal lesion identified. Within normal limits in parenchymal echogenicity.  IVC:  No abnormality visualized.  Pancreas:  Visualized portion unremarkable.  Spleen:  Size and appearance within normal limits.  Right Kidney:  Length: 11.4 cm. Echogenicity within normal limits. No mass or hydronephrosis visualized.  Left Kidney:  Length: 10.7 cm. Echogenicity within normal limits. No mass or hydronephrosis visualized.  Abdominal aorta:  No aneurysm visualized.  Other findings:  None.  IMPRESSION: 1.  Gallbladder sludge  2.  No acute findings.   Electronically Signed   By: Kerby Moors M.D.   On: 09/10/2014 11:45    Scheduled Meds: . amLODipine  10 mg Oral Daily  . atorvastatin  40 mg Oral Daily  . feeding supplement (RESOURCE BREEZE)  1 Container Oral TID BM  . hydrALAZINE  100 mg Oral TID  . insulin aspart  0-20 Units Subcutaneous TID WC  . insulin aspart  0-5 Units Subcutaneous QHS  . insulin glargine  40 Units Subcutaneous Daily  . lisinopril  40 mg Oral Daily  . metoCLOPramide (REGLAN) injection  10 mg Intravenous 4 times per day  . pantoprazole (PROTONIX) IV  40 mg Intravenous Q12H  . potassium chloride  40 mEq Oral Once  . sucralfate  1 g Oral TID WC & HS   Continuous Infusions:     Time spent: 25 minutes    [name], [place]  [facility] Pager [phone]. If 7PM-7AM, please contact night-coverage at [email or web address], password [facility] 09/12/2014, 10:14 AM  LOS: 5 days

## 2014-09-13 ENCOUNTER — Inpatient Hospital Stay (HOSPITAL_COMMUNITY): Payer: Managed Care, Other (non HMO)

## 2014-09-13 LAB — MAGNESIUM: MAGNESIUM: 2 mg/dL (ref 1.5–2.5)

## 2014-09-13 LAB — GLUCOSE, CAPILLARY
GLUCOSE-CAPILLARY: 105 mg/dL — AB (ref 70–99)
Glucose-Capillary: 92 mg/dL (ref 70–99)
Glucose-Capillary: 175 mg/dL — ABNORMAL HIGH (ref 70–99)
Glucose-Capillary: 202 mg/dL — ABNORMAL HIGH (ref 70–99)

## 2014-09-13 LAB — POTASSIUM: POTASSIUM: 2.9 meq/L — AB (ref 3.7–5.3)

## 2014-09-13 MED ORDER — POTASSIUM CHLORIDE 10 MEQ/100ML IV SOLN
10.0000 meq | INTRAVENOUS | Status: AC
  Administered 2014-09-13 (×2): 10 meq via INTRAVENOUS
  Filled 2014-09-13 (×4): qty 100

## 2014-09-13 MED ORDER — POTASSIUM CHLORIDE CRYS ER 20 MEQ PO TBCR
40.0000 meq | EXTENDED_RELEASE_TABLET | Freq: Once | ORAL | Status: AC
  Administered 2014-09-13: 40 meq via ORAL
  Filled 2014-09-13: qty 2

## 2014-09-13 MED ORDER — LORAZEPAM 0.5 MG PO TABS
0.5000 mg | ORAL_TABLET | Freq: Once | ORAL | Status: AC
  Administered 2014-09-13: 0.5 mg via ORAL
  Filled 2014-09-13: qty 1

## 2014-09-13 MED ORDER — POTASSIUM CHLORIDE 20 MEQ/15ML (10%) PO LIQD
40.0000 meq | Freq: Once | ORAL | Status: DC
  Filled 2014-09-13: qty 30

## 2014-09-13 MED ORDER — LORAZEPAM 0.5 MG PO TABS
0.5000 mg | ORAL_TABLET | Freq: Once | ORAL | Status: AC
  Filled 2014-09-13: qty 1

## 2014-09-13 MED ORDER — TECHNETIUM TC 99M SULFUR COLLOID
2.2000 | Freq: Once | INTRAVENOUS | Status: AC | PRN
  Administered 2014-09-13: 2.2 via ORAL

## 2014-09-13 MED ORDER — POTASSIUM CHLORIDE 10 MEQ/100ML IV SOLN: 10.0000 meq | INTRAVENOUS | Status: DC

## 2014-09-13 NOTE — Progress Notes (Signed)
CRITICAL VALUE ALERT  Critical value received:  K+ 2.9  Date of notification:  09/13/2014  Time of notification:  [id number]  Critical value read back:Yes.    Nurse who received alert:  Allean Found RN  MD notified (1st page):  Kathline Magic,  NP  Time of first page:  [phone]  MD notified (2nd page):  Time of second page:  Responding MD:  Kathline Magic  Time MD responded:  05:41

## 2014-09-13 NOTE — Progress Notes (Signed)
[facility] PROGRESS NOTE  Nancy [name] [id number] DOB: March 19, 1961 DOA: 09/07/2014 PCP: Nancy Ends, MD   Brief narrative 53 year old female with uncontrolled type 2 diabetes mellitus, diabetes, hypertension, hyperlipidemia, and recent hospitalization for intractable nausea and vomiting for which he underwent EGD which showed mild distal esophagitis returned to the ED with similar symptoms.    Assessment/Plan: Intractable nausea and vomiting Possibly gastroparesis related to uncontrolled DM. Prolonged hospital stay due to ongoing symptoms. supportive care with antiemetics and pain control. continue BID PPI. -GES done today shows borderline criteria for gastroparesis. Symptoms improved on scheduled reglan and better blood glucose control. tolerating soft diet.     abdominal pain  secondary to gastroparesis. [place] abd done given RUQ pain and shows GB sludge without inflammation or obstruction. LFTs normal. Symptoms markedly improved.  Uncontrolled type II DM/IDDM:  A1C >13. Marland Kitchen On NPH bid at home which needs to be adjusted once able to take by mouth. currently on lantus. FSG controlled.  Accelerated hypertension:   Increased home dose hydralazine to 100mg  3 times a day. Added norvasc and dose increased to 10 mg daily BP stable  Mild distal esophagitis:  Seen on recent EGD. Continue PPI and sucralfate. If GES negative will ask [facility] to reevaluate.   hyperlipidemia:  Continue statins  Hypokalemia Replenishing daily. k 2.9 today.   DVT prophylaxis  Diet: soft.     Code Status: Full  Family Communication: none at bedside  Disposition Plan: home tomorrow if [place]    Consultants:  none  Procedures:  gastric emptying study   Ultrasound abdomen  Antibiotics:  none  HPI/Subjective: Nausea and abd pain much  Improved. tolerated GES. Tolerating soft diet.  Objective: Filed Vitals:   09/13/14 1359  BP: 134/65  Pulse: 97  Temp: 98.9 F (37.2  C)  Resp: 18   No intake or output data in the 24 hours ending 09/13/14 1518 Filed Weights   09/11/14 0604 09/12/14 0500 09/13/14 0425  Weight: 87.9 kg (193 lb 12.6 oz) 83.462 kg (184 lb) 87.5 kg (192 lb 14.4 oz)    Exam:   General:  NAD  HEENT: moist  mucosa  Chest; clear b/l   CVS: NS1&S2  Abd: soft, BS, non distended, Non tender  Extremities: Warm, no edema  Data Reviewed: Basic Metabolic Panel:  Recent Labs Lab 09/08/14 0516 09/09/14 0515 09/10/14 0512 09/11/14 0527 09/12/14 0530 09/13/14 0437  NA 137 139 139 138 140  --   K 3.4* 3.2* 3.1* 3.2* 3.2* 2.9*  CL 98 100 99 99 101  --   CO2 24 24 25 26 25   --   GLUCOSE 252* 129* 97 76 78  --   BUN 7 11 10 10 7   --   CREATININE 0.49* 0.58 0.61 0.66 0.67  --   CALCIUM 9.0 8.9 9.2 8.9 9.2  --   MG  --   --  1.9  --   --  2.0   Liver Function Tests:  Recent Labs Lab 09/07/14 0459 09/10/14 0512  AST 10 15  ALT 16 12  ALKPHOS 76 57  BILITOT 0.5 0.6  PROT 8.6* 7.3  ALBUMIN 4.0 3.5    Recent Labs Lab 09/07/14 0940  LIPASE 38   No results found for this basename: AMMONIA,  in the last 168 hours CBC:  Recent Labs Lab 09/07/14 0459  WBC 11.4*  NEUTROABS 8.9*  HGB 14.2  HCT 41.0  MCV 81.3  PLT 282   Cardiac Enzymes:  No results found for this basename: CKTOTAL, CKMB, CKMBINDEX, TROPONINI,  in the last 168 hours BNP (last 3 results) No results found for this basename: PROBNP,  in the last 8760 hours CBG:  Recent Labs Lab 09/12/14 1124 09/12/14 1615 09/12/14 2128 09/13/14 0742 09/13/14 1126  GLUCAP 141* 149* 198* 92 105*    No results found for this or any previous visit (from the past 240 hour(s)).   Studies: Nm Gastric Emptying  09/13/2014   CLINICAL DATA:  Nausea and vomiting.  EXAM: NUCLEAR MEDICINE GASTRIC EMPTYING SCAN  TECHNIQUE: After oral ingestion of radiolabeled meal, sequential abdominal images were obtained for 120 minutes. Residual percentage of activity remaining within  the stomach was calculated at 60 and 120 minutes.  RADIOPHARMACEUTICALS:  2.2 Technetium 99-m labeled sulfur colloid  COMPARISON:  CT 08/24/2014  FINDINGS: Expected location of the stomach in the left upper quadrant. Ingested meal empties the stomach gradually over the course of the study with 48% retention at 60 min and 31% retention at 120 min (normal retention less than 30% at a 120 min).  IMPRESSION: The gastric emptying is borderline abnormal. 31% retention at 120 min and normal is less than 30%.   Electronically Signed   By: Nancy [name] M.D.   On: 09/13/2014 10:43    Scheduled Meds: . amLODipine  10 mg Oral Daily  . atorvastatin  40 mg Oral Daily  . feeding supplement (RESOURCE BREEZE)  1 Container Oral TID BM  . hydrALAZINE  100 mg Oral TID  . insulin aspart  0-20 Units Subcutaneous TID WC  . insulin aspart  0-5 Units Subcutaneous QHS  . insulin glargine  40 Units Subcutaneous Daily  . lisinopril  40 mg Oral Daily  . metoCLOPramide (REGLAN) injection  10 mg Intravenous 4 times per day  . pantoprazole (PROTONIX) IV  40 mg Intravenous Q12H  . sucralfate  1 g Oral TID WC & HS   Continuous Infusions:     Time spent: 25 minutes    Nancy [name], [place]  [facility] Pager [phone]. If 7PM-7AM, please contact night-coverage at [email or web address], password [facility] 09/13/2014, 3:18 PM  LOS: 6 days

## 2014-09-13 NOTE — Care Management Note (Addendum)
    Page 1 of 1   09/14/2014     12:46:26 PM CARE MANAGEMENT NOTE 09/14/2014  Patient:  Nancy [name], Nancy [name]   Account Number:  [id number]  Date Initiated:  09/13/2014  Documentation initiated by:  Dessa Phi  Subjective/Objective Assessment:   19 [place] W/N/V.     Action/Plan:   FROM HOME.   Anticipated DC Date:  09/14/2014   Anticipated DC Plan:  [place]  CM consult      Choice offered to / List presented to:             Status of service:  Completed, signed off Medicare Important Message given?   (If response is "NO", the following Medicare IM given date fields will be blank) Date Medicare IM given:   Medicare IM given by:   Date Additional Medicare IM given:   Additional Medicare IM given by:    Discharge Disposition:  HOME/SELF CARE  Per UR Regulation:  Reviewed for med. necessity/level of care/duration of stay  If discussed at [place] of Stay Meetings, dates discussed:   09/14/2014    Comments:  09/14/14 Hether Anselmo RN,BSN NCM [phone] D/C HOME NO NEEDS OR ORDERS.  09/13/14 Lorann Tani RN,BSN NCM [phone] GASTRIC EMPTYING STUDY TODAY.NO ANTICIPATED D/C NEEDS.

## 2014-09-14 DIAGNOSIS — K3184 Gastroparesis: Secondary | ICD-10-CM

## 2014-09-14 DIAGNOSIS — E1143 Type 2 diabetes mellitus with diabetic autonomic (poly)neuropathy: Secondary | ICD-10-CM | POA: Diagnosis present

## 2014-09-14 DIAGNOSIS — E1149 Type 2 diabetes mellitus with other diabetic neurological complication: Secondary | ICD-10-CM

## 2014-09-14 LAB — BASIC METABOLIC PANEL
ANION GAP: 11 (ref 5–15)
BUN: 6 mg/dL (ref 6–23)
CALCIUM: 8.9 mg/dL (ref 8.4–10.5)
CO2: 27 meq/L (ref 19–32)
CREATININE: 0.61 mg/dL (ref 0.50–1.10)
Chloride: 103 mEq/L (ref 96–112)
GFR calc Af Amer: >90 mL/min (ref >90)
GFR calc non Af Amer: >90 mL/min (ref >90)
Glucose, Bld: 166 mg/dL — ABNORMAL HIGH (ref 70–99)
Potassium: 3.7 mEq/L (ref 3.7–5.3)
Sodium: 141 mEq/L (ref 137–147)

## 2014-09-14 LAB — GLUCOSE, CAPILLARY
Glucose-Capillary: 141 mg/dL — ABNORMAL HIGH (ref 70–99)
Glucose-Capillary: 194 mg/dL — ABNORMAL HIGH (ref 70–99)

## 2014-09-14 MED ORDER — INSULIN ASPART PROT & ASPART (70-30 MIX) 100 UNIT/ML ~~LOC~~ SUSP: 40.0000 [IU] | Freq: Two times a day (BID) | SUBCUTANEOUS | Status: DC

## 2014-09-14 MED ORDER — BOOST / RESOURCE BREEZE PO LIQD: 1.0000 | Freq: Three times a day (TID) | ORAL | Status: DC

## 2014-09-14 MED ORDER — HYDRALAZINE HCL 50 MG PO TABS: 100.0000 mg | ORAL_TABLET | Freq: Three times a day (TID) | ORAL | Status: DC

## 2014-09-14 MED ORDER — METOCLOPRAMIDE HCL 10 MG PO TABS: 10.0000 mg | ORAL_TABLET | Freq: Four times a day (QID) | ORAL | Status: DC | PRN

## 2014-09-14 MED ORDER — PANTOPRAZOLE SODIUM 40 MG PO TBEC: 40.0000 mg | DELAYED_RELEASE_TABLET | Freq: Two times a day (BID) | ORAL | Status: DC

## 2014-09-14 MED ORDER — AMLODIPINE BESYLATE 10 MG PO TABS: 10.0000 mg | ORAL_TABLET | Freq: Every day | ORAL | Status: DC

## 2014-09-14 NOTE — Progress Notes (Signed)
This patient is receiving IV Protonix. Based on criteria approved by the Pharmacy and Therapeutics Committee, this medication is being converted to the equivalent oral dose form. These criteria include:  . The patient is eating (either orally or per tube) and/or has been taking other orally administered medications for at least 24 hours.  . This patient has no evidence of active gastrointestinal bleeding or impaired GI absorption (gastrectomy, short bowel, patient on TNA or NPO).  If you have questions about this conversion, please contact the pharmacy department.  Thank you.  Dolly Rias RPh 09/14/2014, 2:33 PM Pager [phone]

## 2014-09-14 NOTE — Discharge Summary (Signed)
Physician Discharge Summary  Nancy [name] [id number] DOB: 1961/12/05 DOA: 09/07/2014  PCP: Minerva Ends, MD  Admit date: 09/07/2014 Discharge date: 09/14/2014  Time spent 35 minutes  Recommendations for Outpatient Follow-up:  discharge home with outpt PCP follow up   Discharge Diagnoses:  Principal Problem:   Diabetic gastroparesis associated with type 2 diabetes mellitus  Active Problems: esophagitis   Type II diabetes mellitus with neurological manifestations, uncontrolled   Hypokalemia   Obesity (BMI 30-39.9)   Severe uncontrolled hypertension   Intractable nausea and vomiting   Discharge Condition: fair  Diet recommendation: diabetic gastroparetic diet ( resource provided)  [employer]   09/11/14 0604 09/12/14 0500 09/13/14 0425  Weight: 87.9 kg (193 lb 12.6 oz) 83.462 kg (184 lb) 87.5 kg (192 lb 14.4 oz)    History of present illness:  53 year old female with uncontrolled type 2 diabetes mellitus, diabetes, hypertension, hyperlipidemia, and recent hospitalization for intractable nausea and vomiting for which he underwent EGD which showed mild distal esophagitis returned to the ED with similar symptoms. Hospital course prolonged due to severe gastroparesis symptoms.    Hospital Course:  Intractable nausea and vomiting secondary to severe diabetic gastroparesis patient has uncontrolled DM contributing to gastroparetic symptoms. Prolonged hospital stay due to ongoing symptoms. supportive care with antiemetics and pain control. continue BID PPI.  -GES done shows borderline criteria for gastroparesis. Symptoms improved on scheduled reglan and better blood glucose control. tolerating soft diet.  -patient has been provided resource and education material on gastroparetic diet. Also instructed on tight blood glucose control. She will be discharged on oral reglan 10 mg q6 hr as needed for nausea ns vomiting symptoms.   abdominal pain  secondary to  gastroparesis. [place] abd done given RUQ pain and shows GB sludge without inflammation or obstruction. LFTs normal. Symptoms resolved.   Uncontrolled type II DM/IDDM:  A1C13.5. . On NPH 35 u bid at home. Will discharge on 40 u bid. Have discussed on need for tight fsg control to improve her gastroparetic symptoms and other complications related to uncontrolled DM.  Accelerated hypertension:  Increased home dose hydralazine to 100mg  3 times a day. Added norvasc and dose increased to 10 mg daily BP stable . Resume HCTZ-lisinopril   Mild distal esophagitis:  Seen on recent EGD. Continue PPI  hyperlipidemia:  Continue statins   Hypokalemia  Replenished     Code Status: Full   Family Communication: none at bedside    Disposition Plan: home   Consultants:  None  Procedures:  gastric emptying study  Ultrasound abdomen   Antibiotics:  none   Discharge Exam: Filed Vitals:   09/14/14 0655  BP: 131/79  Pulse: 89  Temp: 99.2 F (37.3 C)  Resp: 18   General: NAD  HEENT: moist mucosa  Chest; clear b/l  CVS: NS1&S2 , no murmurs Abd: soft, BS, non distended, Non tender  Extremities: Warm, no edema    Discharge Instructions You were cared for by a hospitalist during your hospital stay. If you have any questions about your discharge medications or the care you received while you were in the hospital after you are discharged, you can call the unit and asked to speak with the hospitalist on call if the hospitalist that took care of you is not available. Once you are discharged, your primary care physician will handle any further medical issues. Please note that NO REFILLS for any discharge medications will be authorized once you are discharged, as it is imperative that you  return to your primary care physician (or establish a relationship with a primary care physician if you do not have one) for your aftercare needs so that they can reassess your need for medications and monitor  your lab values.   Current Discharge Medication List    START taking these medications   Details  amLODipine (NORVASC) 10 MG tablet Take 1 tablet (10 mg total) by mouth daily. Qty: 30 tablet, Refills: 0    feeding supplement, RESOURCE BREEZE, (RESOURCE BREEZE) LIQD Take 1 Container by mouth 3 (three) times daily between meals. Qty: 90 Container, Refills: 0      CONTINUE these medications which have CHANGED   Details  hydrALAZINE (APRESOLINE) 50 MG tablet Take 2 tablets (100 mg total) by mouth 3 (three) times daily. Qty: 90 tablet, Refills: 0    insulin aspart protamine- aspart (NOVOLOG MIX 70/30) (70-30) 100 UNIT/ML injection Inject 0.4 mLs (40 Units total) into the skin 2 (two) times daily with a meal. Qty: 21 mL, Refills: 3   Associated Diagnoses: Uncontrolled secondary diabetes mellitus with neurological manifestations; Type II diabetes mellitus with neurological manifestations, uncontrolled    metoCLOPramide (REGLAN) 10 MG tablet Take 1 tablet (10 mg total) by mouth every 6 (six) hours as needed for nausea or vomiting. Qty: 60 tablet, Refills: 0      CONTINUE these medications which have NOT CHANGED   Details  acetaminophen (TYLENOL) 500 MG tablet Take 1,000 mg by mouth every 6 (six) hours as needed for mild pain.    atorvastatin (LIPITOR) 40 MG tablet Take 1 tablet (40 mg total) by mouth daily. Qty: 90 tablet, Refills: 3   Associated Diagnoses: Uncontrolled secondary diabetes mellitus with neurological manifestations; Type II diabetes mellitus with neurological manifestations, uncontrolled    dicyclomine (BENTYL) 20 MG tablet Take 1 tablet (20 mg total) by mouth every 8 (eight) hours as needed for spasms. Qty: 20 tablet, Refills: 0    lisinopril-hydrochlorothiazide (PRINZIDE,ZESTORETIC) 20-25 MG per tablet Take 1 tablet by mouth daily. Qty: 30 tablet, Refills: 3    pantoprazole (PROTONIX) 40 MG tablet Take 1 tablet (40 mg total) by mouth daily. Qty: 42 tablet, Refills:  0       Allergies  Allergen Reactions  . Hydrocodone Other (See Comments)    Upset stomach  . Sulfur Hives   Follow-up Information   Follow up with Minerva Ends, MD. Schedule an appointment as soon as possible for a visit in 1 week.   Specialty:  Family Medicine   Contact information:   [address] [place] [address] [phone]        The results of significant diagnostics from this hospitalization (including imaging, microbiology, ancillary and laboratory) are listed below for reference.    Significant Diagnostic Studies: Dg Chest 2 View  08/22/2014   CLINICAL DATA:  Nausea and emesis. Chest and abdomen pain. Weakness.  EXAM: CHEST  2 VIEW  COMPARISON:  07/1914  FINDINGS: The heart size and mediastinal contours are within normal limits. Both lungs are clear. The visualized skeletal structures are unremarkable.  IMPRESSION: No active cardiopulmonary disease.   Electronically Signed   By: Lucienne Capers M.D.   On: 08/22/2014 21:30   Nm Gastric Emptying  09/13/2014   CLINICAL DATA:  Nausea and vomiting.  EXAM: NUCLEAR MEDICINE GASTRIC EMPTYING SCAN  TECHNIQUE: After oral ingestion of radiolabeled meal, sequential abdominal images were obtained for 120 minutes. Residual percentage of activity remaining within the stomach was calculated at 60 and 120  minutes.  RADIOPHARMACEUTICALS:  2.2 Technetium 99-m labeled sulfur colloid  COMPARISON:  CT 08/24/2014  FINDINGS: Expected location of the stomach in the left upper quadrant. Ingested meal empties the stomach gradually over the course of the study with 48% retention at 60 min and 31% retention at 120 min (normal retention less than 30% at a 120 min).  IMPRESSION: The gastric emptying is borderline abnormal. 31% retention at 120 min and normal is less than 30%.   Electronically Signed   By: Markus Daft M.D.   On: 09/13/2014 10:43   US Abdomen Complete  09/10/2014   CLINICAL DATA:  Right upper quadrant pain.  Nausea  and vomiting  EXAM: ULTRASOUND ABDOMEN COMPLETE  COMPARISON:  08/24/2014  FINDINGS: Gallbladder:  Sludge noted within the gallbladder. No stones or wall thickening identified. Negative sonographic Murphy's sign.  Common bile duct:  Diameter: 5 mm.  Liver:  No focal lesion identified. Within normal limits in parenchymal echogenicity.  IVC:  No abnormality visualized.  Pancreas:  Visualized portion unremarkable.  Spleen:  Size and appearance within normal limits.  Right Kidney:  Length: 11.4 cm. Echogenicity within normal limits. No mass or hydronephrosis visualized.  Left Kidney:  Length: 10.7 cm. Echogenicity within normal limits. No mass or hydronephrosis visualized.  Abdominal aorta:  No aneurysm visualized.  Other findings:  None.  IMPRESSION: 1.  Gallbladder sludge  2.  No acute findings.   Electronically Signed   By: Kerby Moors M.D.   On: 09/10/2014 11:45   Ct Abdomen Pelvis W Contrast  08/24/2014   CLINICAL DATA:  Diffuse abdominal pain, pancreatitis, leukocytosis, anemia, history diabetes, hypertension, hyperlipidemia, irritable bowel syndrome  EXAM: CT ABDOMEN AND PELVIS WITH CONTRAST  TECHNIQUE: Multidetector CT imaging of the abdomen and pelvis was performed using the standard protocol following bolus administration of intravenous contrast. Sagittal and coronal MPR images reconstructed from axial data set.  CONTRAST:  17mL OMNIPAQUE IOHEXOL 300 MG/ML SOLN IV. Dilute oral contrast.  COMPARISON:  08/21/2014  FINDINGS: Lung bases clear.  Single tiny calcification within RIGHT adrenal gland unchanged.  Single calcified granuloma within spleen.  Liver, spleen, pancreas, kidneys, and adrenal glands otherwise normal appearance.  Specifically, no definite CT features of acute pancreatitis.  Unremarkable bladder, ovaries and ureters with surgical absence of uterus.  Cecal wall minimally prominent but the cecum is incompletely distended, question artifact.  Stomach and bowel loops otherwise normal appearance.   Normal appendix.  No mass, adenopathy, free fluid or inflammatory process.  No acute osseous findings.  IMPRESSION: No acute intra-abdominal or intrapelvic abnormalities.  Specifically no CT features of pancreatitis identified.   Electronically Signed   By: Lavonia Dana M.D.   On: 08/24/2014 15:11   Ct Abdomen Pelvis W Contrast  08/21/2014   CLINICAL DATA:  Nausea and vomiting  EXAM: CT ABDOMEN AND PELVIS WITH CONTRAST  TECHNIQUE: Multidetector CT imaging of the abdomen and pelvis was performed using the standard protocol following bolus administration of intravenous contrast.  CONTRAST:  16mL OMNIPAQUE IOHEXOL 300 MG/ML SOLN, 164mL OMNIPAQUE IOHEXOL 300 MG/ML SOLN  COMPARISON:  None.  FINDINGS: Liver, gallbladder, adrenal glands, kidneys, and pancreas are unremarkable. Splenic granuloma incidentally noted. There is minimal hypoenhancement of the pancreatic uncinate process, adjacent duodenal wall thickening, and surrounding stranding. No pancreatic ductal dilatation. No surrounding fluid collection. 7 mm short axis porta hepatis node identified image 24. No free air. No evidence for bowel obstruction.  No other bowel wall thickening or focal segmental dilatation. The appendix is  normal. The bladder is normal. Ovaries are unremarkable. Uterus presumed surgically absent. No acute osseous abnormality.  IMPRESSION: Second portion of duodenum borderline wall thickening, surrounding stranding, and hypoenhancement of the uncinate process of the pancreas; these findings suggest groove pancreatitis. Correlate with amylase and lipase. No complicating features such as abscess/ pseudocyst or bowel obstruction.   Electronically Signed   By: Conchita Paris M.D.   On: 08/21/2014 13:40    Microbiology: No results found for this or any previous visit (from the past 240 hour(s)).   Labs: Basic Metabolic Panel:  Recent Labs Lab 09/09/14 0515 09/10/14 0512 09/11/14 0527 09/12/14 0530 09/13/14 0437 09/14/14 0453  NA  139 139 138 140  --  141  K 3.2* 3.1* 3.2* 3.2* 2.9* 3.7  CL 100 99 99 101  --  103  CO2 24 25 26 25   --  27  GLUCOSE 129* 97 76 78  --  166*  BUN 11 10 10 7   --  6  CREATININE 0.58 0.61 0.66 0.67  --  0.61  CALCIUM 8.9 9.2 8.9 9.2  --  8.9  MG  --  1.9  --   --  2.0  --    Liver Function Tests:  Recent Labs Lab 09/10/14 0512  AST 15  ALT 12  ALKPHOS 57  BILITOT 0.6  PROT 7.3  ALBUMIN 3.5   No results found for this basename: LIPASE, AMYLASE,  in the last 168 hours No results found for this basename: AMMONIA,  in the last 168 hours CBC: No results found for this basename: WBC, NEUTROABS, HGB, HCT, MCV, PLT,  in the last 168 hours Cardiac Enzymes: No results found for this basename: CKTOTAL, CKMB, CKMBINDEX, TROPONINI,  in the last 168 hours BNP: BNP (last 3 results) No results found for this basename: PROBNP,  in the last 8760 hours CBG:  Recent Labs Lab 09/13/14 0742 09/13/14 1126 09/13/14 1634 09/13/14 2222 09/14/14 0756  GLUCAP 92 105* 175* 202* 141*       Signed:  Bacilio Abascal  [facility] 09/14/2014, 10:15 AM

## 2014-09-14 NOTE — Discharge Instructions (Addendum)

## 2014-09-14 NOTE — Progress Notes (Signed)
Nutrition Education Note  RD consulted for nutrition education regarding diabetes and gastroparesis.   Lab Results  Component Value Date   HGBA1C 13.4* 08/04/2014    RD provided "Carbohydrate Counting for People with Diabetes" and "Gastroparesis Nutrition Therapy" handout from the Academy of Nutrition and Dietetics. Discussed different food groups and their effects on blood sugar, emphasizing carbohydrate-containing foods. Provided list of carbohydrates and recommended serving sizes of common foods.  Discussed importance of controlled and consistent carbohydrate intake throughout the day. Provided examples of ways to balance meals/snacks and encouraged intake of low-fiber and low-fat foods. Teach back method used.  Diet recall reviewed, indicates pt following diabetic diet. Drinks diet sodas.   Expect good compliance.  Body mass index is 34.18 kg/(m^2). Pt meets criteria for class I obesity based on current BMI.  Current diet order is soft diet, patient is consuming approximately 100% of meals at this time. Labs and medications reviewed. No further nutrition interventions warranted at this time. RD contact information provided. If additional nutrition issues arise, please re-consult RD.  Carlis Stable MS, [place], LDN [phone] Pager [phone] Weekend/After Hours Pager

## 2014-09-14 NOTE — Progress Notes (Signed)
Patient alert and oriented, discharged instructions given, patient verbalize understanding of orders given, patient declined My Chart access at this time, patient in stable condition at this time

## 2014-09-21 ENCOUNTER — Ambulatory Visit: Payer: Managed Care, Other (non HMO) | Attending: Family Medicine | Admitting: Family Medicine

## 2014-09-21 ENCOUNTER — Encounter: Payer: Self-pay | Admitting: Family Medicine

## 2014-09-21 VITALS — BP 141/87 | HR 88 | Temp 98.3°F | Resp 18 | Ht 63.0 in | Wt 190.0 lb

## 2014-09-21 DIAGNOSIS — M545 Low back pain, unspecified: Secondary | ICD-10-CM | POA: Insufficient documentation

## 2014-09-21 DIAGNOSIS — IMO0002 Reserved for concepts with insufficient information to code with codable children: Secondary | ICD-10-CM

## 2014-09-21 DIAGNOSIS — E1141 Type 2 diabetes mellitus with diabetic mononeuropathy: Secondary | ICD-10-CM

## 2014-09-21 DIAGNOSIS — Z9111 Patient's noncompliance with dietary regimen: Secondary | ICD-10-CM | POA: Insufficient documentation

## 2014-09-21 DIAGNOSIS — R112 Nausea with vomiting, unspecified: Secondary | ICD-10-CM | POA: Diagnosis present

## 2014-09-21 DIAGNOSIS — R739 Hyperglycemia, unspecified: Secondary | ICD-10-CM

## 2014-09-21 DIAGNOSIS — E1149 Type 2 diabetes mellitus with other diabetic neurological complication: Secondary | ICD-10-CM

## 2014-09-21 DIAGNOSIS — E119 Type 2 diabetes mellitus without complications: Secondary | ICD-10-CM | POA: Insufficient documentation

## 2014-09-21 DIAGNOSIS — E1165 Type 2 diabetes mellitus with hyperglycemia: Secondary | ICD-10-CM

## 2014-09-21 DIAGNOSIS — I160 Hypertensive urgency: Secondary | ICD-10-CM | POA: Insufficient documentation

## 2014-09-21 DIAGNOSIS — I10 Essential (primary) hypertension: Secondary | ICD-10-CM

## 2014-09-21 DIAGNOSIS — R1110 Vomiting, unspecified: Secondary | ICD-10-CM

## 2014-09-21 LAB — GLUCOSE, POCT (MANUAL RESULT ENTRY): POC GLUCOSE: 238 mg/dL — AB (ref 70–99)

## 2014-09-21 MED ORDER — CYCLOBENZAPRINE HCL 10 MG PO TABS: 10.0000 mg | ORAL_TABLET | Freq: Three times a day (TID) | ORAL | Status: DC | PRN

## 2014-09-21 MED ORDER — SITAGLIPTIN PHOSPHATE 100 MG PO TABS: 100.0000 mg | ORAL_TABLET | Freq: Every day | ORAL | Status: DC

## 2014-09-21 NOTE — Assessment & Plan Note (Signed)
A: CBG above goal Meds: complaint with insulin, metformin stopped Diet: non complaint Exercise: non compliant  P: Start [place] Counseled regarding low carb diet

## 2014-09-21 NOTE — Assessment & Plan Note (Signed)
resolved off metformin and on reglan/PPI

## 2014-09-21 NOTE — Progress Notes (Signed)
   Subjective:    Patient ID: Nancy [name], female    DOB: [birth year], 53 y.o.   MRN: [id number] CC: HFU for intractable nausea and vomiting  HPI 53 yo F presents for HFU for intractable nausea and vomiting:  1. Nausea and vomiting: resolved off metformin. No reglan and protonix. Avoiding NSAIDs.   2. DM2: non compliant with low carb diet. Compliant with insulin.   Soc hx: chronic non smoker  Review of Systems As per HPI     Objective:   Physical Exam BP 141/87  Pulse 88  Temp(Src) 98.3 F (36.8 C) (Oral)  Resp 18  Ht 5\' 3"  (1.6 m)  Wt 190 lb (86.183 kg)  BMI 33.67 kg/m2  SpO2 100% General appearance: alert, cooperative and no distress Lungs: clear to auscultation bilaterally Heart: regular rate and rhythm, S1, S2 normal, no murmur, click, rub or gallop Abdomen: soft, non-tender; bowel sounds normal; no masses,  no organomegaly Back Exam: Back: Normal Curvature, no deformities or CVA tenderness  Paraspinal Tenderness: R L5 and S1  LE Strength 5/5  LE Sensation: in tact  LE Reflexes 2+ and symmetric  Straight leg raise: negative     Lab Results  Component Value Date   HGBA1C 13.4* 08/04/2014        Assessment & Plan:

## 2014-09-21 NOTE — Patient Instructions (Signed)
[name],  Thank you for coming in to see me today. I am glad you are feeling better.  1. Diabetes: Start [place]. please call if you cannot tolerate.  Low carb diet   2. Low back pain with muscle spasm Flexeril as needed   F/u in 4 week for RN blood sugar check.  F/u in 10 weeks with me.   Dr. Adrian Blackwater

## 2014-09-21 NOTE — Progress Notes (Signed)
HFU Pt stated was in the hospital with stomach problems

## 2014-09-21 NOTE — Assessment & Plan Note (Signed)
A: R sided muscle spasm, no sciatica P: Flexeril prn

## 2014-10-05 ENCOUNTER — Other Ambulatory Visit: Payer: Managed Care, Other (non HMO)

## 2015-02-10 ENCOUNTER — Encounter (INDEPENDENT_AMBULATORY_CARE_PROVIDER_SITE_OTHER): Payer: Self-pay | Admitting: Ophthalmology

## 2015-02-10 DIAGNOSIS — H43813 Vitreous degeneration, bilateral: Secondary | ICD-10-CM

## 2015-02-10 DIAGNOSIS — I10 Essential (primary) hypertension: Secondary | ICD-10-CM

## 2015-02-10 DIAGNOSIS — H2513 Age-related nuclear cataract, bilateral: Secondary | ICD-10-CM

## 2015-02-10 DIAGNOSIS — H35033 Hypertensive retinopathy, bilateral: Secondary | ICD-10-CM

## 2015-02-10 DIAGNOSIS — E11341 Type 2 diabetes mellitus with severe nonproliferative diabetic retinopathy with macular edema: Secondary | ICD-10-CM

## 2015-02-10 DIAGNOSIS — E11311 Type 2 diabetes mellitus with unspecified diabetic retinopathy with macular edema: Secondary | ICD-10-CM

## 2015-04-07 ENCOUNTER — Encounter (INDEPENDENT_AMBULATORY_CARE_PROVIDER_SITE_OTHER): Payer: Self-pay | Admitting: Ophthalmology

## 2015-09-16 ENCOUNTER — Ambulatory Visit: Payer: Managed Care, Other (non HMO) | Attending: Family Medicine

## 2016-07-17 DIAGNOSIS — K3184 Gastroparesis: Secondary | ICD-10-CM

## 2016-07-17 HISTORY — DX: Gastroparesis: K31.84

## 2016-07-20 ENCOUNTER — Ambulatory Visit: Payer: Managed Care, Other (non HMO)

## 2016-07-21 ENCOUNTER — Emergency Department (HOSPITAL_COMMUNITY)
Admission: EM | Admit: 2016-07-21 | Discharge: 2016-07-21 | Disposition: A | Discharge status: Home / Self Care | Payer: Managed Care, Other (non HMO) | Attending: Emergency Medicine | Admitting: Emergency Medicine

## 2016-07-21 ENCOUNTER — Encounter (HOSPITAL_COMMUNITY): Payer: Self-pay | Admitting: Emergency Medicine

## 2016-07-21 DIAGNOSIS — Z79899 Other long term (current) drug therapy: Secondary | ICD-10-CM | POA: Insufficient documentation

## 2016-07-21 DIAGNOSIS — E1165 Type 2 diabetes mellitus with hyperglycemia: Secondary | ICD-10-CM | POA: Insufficient documentation

## 2016-07-21 DIAGNOSIS — I10 Essential (primary) hypertension: Secondary | ICD-10-CM | POA: Insufficient documentation

## 2016-07-21 DIAGNOSIS — R112 Nausea with vomiting, unspecified: Secondary | ICD-10-CM

## 2016-07-21 DIAGNOSIS — R739 Hyperglycemia, unspecified: Secondary | ICD-10-CM

## 2016-07-21 DIAGNOSIS — M545 Low back pain, unspecified: Secondary | ICD-10-CM

## 2016-07-21 DIAGNOSIS — Z794 Long term (current) use of insulin: Secondary | ICD-10-CM | POA: Insufficient documentation

## 2016-07-21 LAB — COMPREHENSIVE METABOLIC PANEL
ALBUMIN: 4.2 g/dL (ref 3.5–5.0)
ALK PHOS: 86 U/L (ref 38–126)
ALT: 14 U/L (ref 14–54)
ANION GAP: 12 (ref 5–15)
AST: 15 U/L (ref 15–41)
BUN: 15 mg/dL (ref 6–20)
CHLORIDE: 95 mmol/L — AB (ref 101–111)
CO2: 26 mmol/L (ref 22–32)
Calcium: 9.3 mg/dL (ref 8.9–10.3)
Creatinine, Ser: 0.78 mg/dL (ref 0.44–1.00)
GFR calc Af Amer: >60 mL/min (ref >60)
GFR calc non Af Amer: >60 mL/min (ref >60)
GLUCOSE: 325 mg/dL — AB (ref 65–99)
POTASSIUM: 3.8 mmol/L (ref 3.5–5.1)
SODIUM: 133 mmol/L — AB (ref 135–145)
Total Bilirubin: 0.9 mg/dL (ref 0.3–1.2)
Total Protein: 8.4 g/dL — ABNORMAL HIGH (ref 6.5–8.1)

## 2016-07-21 LAB — CBC
HEMATOCRIT: 43 % (ref 36.0–46.0)
HEMOGLOBIN: 15 g/dL (ref 12.0–15.0)
MCH: 28.5 pg (ref 26.0–34.0)
MCHC: 34.9 g/dL (ref 30.0–36.0)
MCV: 81.7 fL (ref 78.0–100.0)
Platelets: 320 10*3/uL (ref 150–400)
RBC: 5.26 MIL/uL — ABNORMAL HIGH (ref 3.87–5.11)
RDW: 12.3 % (ref 11.5–15.5)
WBC: 11.7 10*3/uL — ABNORMAL HIGH (ref 4.0–10.5)

## 2016-07-21 LAB — URINE MICROSCOPIC-ADD ON
Bacteria, UA: NONE SEEN
RBC / HPF: NONE SEEN RBC/hpf (ref 0–5)

## 2016-07-21 LAB — LIPASE, BLOOD: LIPASE: 36 U/L (ref 11–51)

## 2016-07-21 LAB — CBG MONITORING, ED: Glucose-Capillary: 316 mg/dL — ABNORMAL HIGH (ref 65–99)

## 2016-07-21 LAB — URINALYSIS, ROUTINE W REFLEX MICROSCOPIC
Hgb urine dipstick: NEGATIVE
Ketones, ur: >80 mg/dL — AB
Nitrite: NEGATIVE
PH: 5.5 (ref 5.0–8.0)
Protein, ur: 100 mg/dL — AB
SPECIFIC GRAVITY, URINE: 1.027 (ref 1.005–1.030)

## 2016-07-21 MED ORDER — ONDANSETRON 4 MG PO TBDP
4.0000 mg | ORAL_TABLET | Freq: Once | ORAL | Status: AC | PRN
  Administered 2016-07-21: 4 mg via ORAL
  Filled 2016-07-21: qty 1

## 2016-07-21 MED ORDER — BUPIVACAINE HCL (PF) 0.5 % IJ SOLN
20.0000 mL | Freq: Once | INTRAMUSCULAR | Status: AC
  Administered 2016-07-21: 20 mL
  Filled 2016-07-21: qty 30

## 2016-07-21 MED ORDER — INSULIN ASPART 100 UNIT/ML ~~LOC~~ SOLN
10.0000 [IU] | Freq: Once | SUBCUTANEOUS | Status: AC
  Administered 2016-07-21: 10 [IU] via INTRAVENOUS
  Filled 2016-07-21: qty 1

## 2016-07-21 MED ORDER — SODIUM CHLORIDE 0.9 % IV BOLUS (SEPSIS)
1000.0000 mL | Freq: Once | INTRAVENOUS | Status: AC
  Administered 2016-07-21: 1000 mL via INTRAVENOUS

## 2016-07-21 NOTE — ED Notes (Signed)
Patient d/c'd self care.  F/U reviewed with patient.  Patient verbalized understanding.

## 2016-07-21 NOTE — ED Notes (Signed)
Patient aware of the need for urine.

## 2016-07-21 NOTE — ED Notes (Signed)
Bupivacaine at bedside.

## 2016-07-21 NOTE — ED Triage Notes (Signed)
Patient presents for right flank pain, 10+ episodes of emesis, nausea, subjective fever, generalized abdominal pain and no BM x8 days.

## 2016-07-21 NOTE — ED Provider Notes (Signed)
[facility] DEPT Provider Note   CSN: [id number] Arrival date & time: 07/21/16  1842  First Provider Contact:  First MD Initiated Contact with Patient 07/21/16 1939        History   Chief Complaint Chief Complaint  Patient presents with  . Flank Pain  . Emesis    HPI Nancy [name] is a 55 y.o. female.  The history is provided by the patient.  Back Pain   This is a new problem. The current episode started 2 days ago. The problem occurs constantly. The problem has not changed since onset.The pain is associated with no known injury. The pain is present in the thoracic spine. Quality: aching. The pain does not radiate. The pain is moderate. The symptoms are aggravated by twisting. Pertinent negatives include no fever, no bowel incontinence and no bladder incontinence. Treatments tried: tylenol. The treatment provided no relief. Risk factors include obesity and lack of exercise.    Past Medical History:  Diagnosis Date  . Anemia 2006  . Depression 2014   previously on amitryptiline   . Diabetes mellitus with neurological manifestation ([place]) 2006  . Fracture of left ankle 1997   . HOH (hard of hearing) 2004   . Hyperlipidemia 2006  . Hypertension 2006  . IBS (irritable bowel syndrome) 2002   . Leukopenia 2015   . Shingles 2009   . Thyroid nodule 2004    Patient Active Problem List   Diagnosis Date Noted  . Low back pain 09/21/2014  . Hypertension 09/21/2014  . Diabetic gastroparesis associated with type 2 diabetes mellitus ([place]) 09/14/2014  . Acute esophagitis 08/26/2014  . Thyroid nodule 08/17/2014  . Obesity (BMI 30-39.9) 08/17/2014  . Type II diabetes mellitus with neurological manifestations, uncontrolled ([place]) 08/04/2014  . Elevated serum protein level 08/04/2014    Past Surgical History:  Procedure Laterality Date  . ABDOMINAL HYSTERECTOMY  2005  . CESAREAN SECTION  1983   . ESOPHAGOGASTRODUODENOSCOPY (EGD) WITH PROPOFOL Left 08/26/2014   Procedure:  ESOPHAGOGASTRODUODENOSCOPY (EGD) WITH PROPOFOL;  Surgeon: Arta Silence, MD;  Location: [facility] END[facility]COPY;  Service: Endoscopy;  Laterality: Left;    OB History    No data available       Home Medications    Prior to Admission medications   Medication Sig Start Date End Date Taking? Authorizing Provider  acetaminophen (TYLENOL) 500 MG tablet Take 1,000 mg by mouth every 6 (six) hours as needed for mild pain.    Historical Provider, MD  amLODipine (NORVASC) 10 MG tablet Take 1 tablet (10 mg total) by mouth daily. 09/14/14   Nishant Dhungel, MD  atorvastatin (LIPITOR) 40 MG tablet Take 1 tablet (40 mg total) by mouth daily. 08/17/14   Josalyn Funches, MD  cyclobenzaprine (FLEXERIL) 10 MG tablet Take 1 tablet (10 mg total) by mouth 3 (three) times daily as needed for muscle spasms. 09/21/14   Josalyn Funches, MD  dicyclomine (BENTYL) 20 MG tablet Take 1 tablet (20 mg total) by mouth every 8 (eight) hours as needed for spasms. 08/21/14   Jola Schmidt, MD  feeding supplement, RESOURCE BREEZE, (RESOURCE BREEZE) LIQD Take 1 Container by mouth 3 (three) times daily between meals. 09/14/14   Nishant Dhungel, MD  hydrALAZINE (APRESOLINE) 50 MG tablet Take 2 tablets (100 mg total) by mouth 3 (three) times daily. 09/14/14   Nishant Dhungel, MD  insulin aspart protamine- aspart (NOVOLOG MIX 70/30) (70-30) 100 UNIT/ML injection Inject 0.4 mLs (40 Units total) into the skin 2 (two) times daily with  a meal. 09/14/14   Nishant Dhungel, MD  lisinopril-hydrochlorothiazide (PRINZIDE,ZESTORETIC) 20-25 MG per tablet Take 1 tablet by mouth daily. 08/08/14   Venetia Maxon Rama, MD  metoCLOPramide (REGLAN) 10 MG tablet Take 1 tablet (10 mg total) by mouth every 6 (six) hours as needed for nausea or vomiting. 09/14/14   Nishant Dhungel, MD  pantoprazole (PROTONIX) 40 MG tablet Take 1 tablet (40 mg total) by mouth daily. 08/26/14   Nishant Dhungel, MD  sitaGLIPtin (JANUVIA) 100 MG tablet Take 1 tablet (100 mg total) by mouth daily.  09/21/14   Boykin Nearing, MD    Family History Family History  Problem Relation Age of Onset  . Hypertension Mother   . Heart disease Mother   . Diabetes Mother   . Thyroid disease Mother   . Congestive Heart Failure Mother   . Breast cancer Maternal Grandmother   . Colon cancer Maternal Grandfather   . Heart attack Sister   . Heart disease Brother   . Hyperlipidemia Brother   . Hypertension Brother   . Diabetes Father   . Breast cancer Maternal Aunt     Social History Social History  Substance Use Topics  . Smoking status: Never Smoker  . Smokeless tobacco: Never Used  . Alcohol use Yes     Comment: occasion      Allergies   Hydrocodone; Metformin and related; and Sulfur   Review of Systems Review of Systems  Constitutional: Negative for fever.  Gastrointestinal: Positive for nausea and vomiting. Negative for bowel incontinence and diarrhea.  Genitourinary: Negative for bladder incontinence.  Musculoskeletal: Positive for back pain.  All other systems reviewed and are negative.    Physical Exam Updated Vital Signs BP 143/96 (BP Location: Right Arm)   Pulse 109   Temp 98.1 F (36.7 C) (Oral)   Resp 18   SpO2 98%   Physical Exam  Constitutional: She is oriented to person, place, and time. She appears well-developed and well-nourished. No distress.  HENT:  Head: Normocephalic.  Eyes: Conjunctivae are normal.  Neck: Neck supple. No tracheal deviation present.  Cardiovascular: Normal rate and regular rhythm.   Pulmonary/Chest: Effort normal. No respiratory distress.  Abdominal: Soft. She exhibits no distension. There is no tenderness. There is no rebound and no guarding.  Musculoskeletal:       Lumbar back: She exhibits tenderness and pain.       Back:  Neurological: She is alert and oriented to person, place, and time.  Skin: Skin is warm and dry.  Psychiatric: She has a normal mood and affect.  Vitals reviewed.    ED Treatments / Results   Labs (all labs ordered are listed, but only abnormal results are displayed) Labs Reviewed  COMPREHENSIVE METABOLIC PANEL - Abnormal; Notable for the following:       Result Value   Sodium 133 (*)    Chloride 95 (*)    Glucose, Bld 325 (*)    Total Protein 8.4 (*)    All other components within normal limits  URINALYSIS, ROUTINE W REFLEX MICROSCOPIC (NOT AT [facility]) - Abnormal; Notable for the following:    APPearance CLOUDY (*)    Glucose, UA >1000 (*)    Bilirubin Urine SMALL (*)    Ketones, ur >80 (*)    Protein, ur 100 (*)    Leukocytes, UA SMALL (*)    All other components within normal limits  CBC - Abnormal; Notable for the following:    WBC 11.7 (*)  RBC 5.26 (*)    All other components within normal limits  URINE MICROSCOPIC-ADD ON - Abnormal; Notable for the following:    Squamous Epithelial / LPF 0-5 (*)    All other components within normal limits  CBG MONITORING, ED - Abnormal; Notable for the following:    Glucose-Capillary 316 (*)    All other components within normal limits  LIPASE, BLOOD    EKG  EKG Interpretation None       Radiology No results found.  Procedures Procedures (including critical care time)  Procedure Note: Trigger Point Injection for Myofascial pain  Performed by Dr. Laneta Simmers Indication: muscle/myofascial pain Muscle body and tendon sheath of the bilateral lumbar paraspinal muscle(s) were injected with 0.5% bupivacaine under sterile technique for release of muscle spasm/pain. Patient tolerated well with immediate improvement of symptoms and no immediate complications following procedure.  CPT Code:   1 or 2 muscle bodies: 20552   Medications Ordered in ED Medications  ondansetron (ZOFRAN-ODT) disintegrating tablet 4 mg (4 mg Oral Given 07/21/16 1902)  bupivacaine (MARCAINE) 0.5 % injection 20 mL (20 mLs Infiltration Given by Other 07/21/16 2150)  insulin aspart (novoLOG) injection 10 Units (10 Units Intravenous Given 07/21/16 2151)   sodium chloride 0.9 % bolus 1,000 mL (0 mLs Intravenous Stopped 07/21/16 2238)     Initial Impression / Assessment and Plan / ED Course  I have reviewed the triage vital signs and the nursing notes.  Pertinent labs & imaging results that were available during my care of the patient were reviewed by me and considered in my medical decision making (see chart for details).  Clinical Course    55 y.o. female presents with low back pain and ogoing nausea and vomiting. Given IV insulin and fluids for poorly controlled diabetes and emesis likely secondary to exacerbation of gastroparesis given history. Trigger point injections applied to lumbar back with improvement in MSK symptoms that appear to be related to stasis. Labs reassuring for uncomplicated hyperglycemia d/t noncompliance. Plan to follow up with PCP as needed and return precautions discussed for worsening or new concerning symptoms.   Final Clinical Impressions(s) / ED Diagnoses   Final diagnoses:  Non-intractable vomiting with nausea, vomiting of unspecified type  Hyperglycemia without ketosis  Bilateral low back pain without sciatica    New Prescriptions New Prescriptions   No medications on file     Leo Grosser, MD 07/23/16 [phone]

## 2016-07-21 NOTE — ED Notes (Signed)
Charge at bedside to attempt blood draw.

## 2016-07-21 NOTE — ED Notes (Signed)
Attempts made to obtain request for redraw of blood work unsuccessful.  Will ask another RN for assistance.

## 2016-07-21 NOTE — ED Notes (Signed)
Per Dr. Laneta Simmers perform POC CBG, if patient glucose still over 300 administer full 10 units of novolog.

## 2016-07-23 ENCOUNTER — Encounter (HOSPITAL_COMMUNITY): Payer: Self-pay

## 2016-07-23 ENCOUNTER — Emergency Department (HOSPITAL_COMMUNITY): Payer: Managed Care, Other (non HMO)

## 2016-07-23 ENCOUNTER — Inpatient Hospital Stay (HOSPITAL_COMMUNITY)
Admission: EM | Admit: 2016-07-23 | Discharge: 2016-07-26 | DRG: 074 | Disposition: A | Discharge status: Home / Self Care | Payer: Managed Care, Other (non HMO) | Attending: Family Medicine | Admitting: Family Medicine

## 2016-07-23 DIAGNOSIS — R112 Nausea with vomiting, unspecified: Secondary | ICD-10-CM

## 2016-07-23 DIAGNOSIS — E871 Hypo-osmolality and hyponatremia: Secondary | ICD-10-CM | POA: Diagnosis present

## 2016-07-23 DIAGNOSIS — K589 Irritable bowel syndrome without diarrhea: Secondary | ICD-10-CM | POA: Diagnosis present

## 2016-07-23 DIAGNOSIS — H9190 Unspecified hearing loss, unspecified ear: Secondary | ICD-10-CM | POA: Diagnosis present

## 2016-07-23 DIAGNOSIS — E1140 Type 2 diabetes mellitus with diabetic neuropathy, unspecified: Secondary | ICD-10-CM | POA: Diagnosis not present

## 2016-07-23 DIAGNOSIS — R739 Hyperglycemia, unspecified: Secondary | ICD-10-CM

## 2016-07-23 DIAGNOSIS — I10 Essential (primary) hypertension: Secondary | ICD-10-CM | POA: Diagnosis not present

## 2016-07-23 DIAGNOSIS — E1143 Type 2 diabetes mellitus with diabetic autonomic (poly)neuropathy: Principal | ICD-10-CM | POA: Diagnosis present

## 2016-07-23 DIAGNOSIS — IMO0002 Reserved for concepts with insufficient information to code with codable children: Secondary | ICD-10-CM | POA: Diagnosis present

## 2016-07-23 DIAGNOSIS — E1149 Type 2 diabetes mellitus with other diabetic neurological complication: Secondary | ICD-10-CM | POA: Diagnosis present

## 2016-07-23 DIAGNOSIS — Z794 Long term (current) use of insulin: Secondary | ICD-10-CM

## 2016-07-23 DIAGNOSIS — D72829 Elevated white blood cell count, unspecified: Secondary | ICD-10-CM | POA: Diagnosis present

## 2016-07-23 DIAGNOSIS — E1165 Type 2 diabetes mellitus with hyperglycemia: Secondary | ICD-10-CM | POA: Diagnosis present

## 2016-07-23 DIAGNOSIS — K3184 Gastroparesis: Secondary | ICD-10-CM | POA: Diagnosis present

## 2016-07-23 DIAGNOSIS — E1349 Other specified diabetes mellitus with other diabetic neurological complication: Secondary | ICD-10-CM

## 2016-07-23 DIAGNOSIS — E785 Hyperlipidemia, unspecified: Secondary | ICD-10-CM | POA: Diagnosis present

## 2016-07-23 DIAGNOSIS — R1110 Vomiting, unspecified: Secondary | ICD-10-CM

## 2016-07-23 DIAGNOSIS — E876 Hypokalemia: Secondary | ICD-10-CM | POA: Diagnosis not present

## 2016-07-23 DIAGNOSIS — E1365 Other specified diabetes mellitus with hyperglycemia: Secondary | ICD-10-CM

## 2016-07-23 DIAGNOSIS — E869 Volume depletion, unspecified: Secondary | ICD-10-CM | POA: Diagnosis present

## 2016-07-23 DIAGNOSIS — R17 Unspecified jaundice: Secondary | ICD-10-CM

## 2016-07-23 HISTORY — DX: Gastroparesis: K31.84

## 2016-07-23 LAB — COMPREHENSIVE METABOLIC PANEL
ALK PHOS: 82 U/L (ref 38–126)
ALT: 15 U/L (ref 14–54)
AST: 27 U/L (ref 15–41)
Albumin: 3.9 g/dL (ref 3.5–5.0)
Anion gap: 9 (ref 5–15)
BILIRUBIN TOTAL: 1.3 mg/dL — AB (ref 0.3–1.2)
BUN: 8 mg/dL (ref 6–20)
CALCIUM: 9.5 mg/dL (ref 8.9–10.3)
CO2: 23 mmol/L (ref 22–32)
CREATININE: 0.73 mg/dL (ref 0.44–1.00)
Chloride: 99 mmol/L — ABNORMAL LOW (ref 101–111)
GFR calc non Af Amer: >60 mL/min (ref >60)
GLUCOSE: 346 mg/dL — AB (ref 65–99)
Potassium: 4.4 mmol/L (ref 3.5–5.1)
SODIUM: 131 mmol/L — AB (ref 135–145)
Total Protein: 7.6 g/dL (ref 6.5–8.1)

## 2016-07-23 LAB — CBC WITH DIFFERENTIAL/PLATELET
Basophils Absolute: 0 10*3/uL (ref 0.0–0.1)
Basophils Relative: 0 %
EOS ABS: 0.1 10*3/uL (ref 0.0–0.7)
Eosinophils Relative: 1 %
HEMATOCRIT: 42.4 % (ref 36.0–46.0)
HEMOGLOBIN: 14.9 g/dL (ref 12.0–15.0)
LYMPHS ABS: 2.3 10*3/uL (ref 0.7–4.0)
LYMPHS PCT: 23 %
MCH: 28.6 pg (ref 26.0–34.0)
MCHC: 35.1 g/dL (ref 30.0–36.0)
MCV: 81.4 fL (ref 78.0–100.0)
Monocytes Absolute: 0.5 10*3/uL (ref 0.1–1.0)
Monocytes Relative: 5 %
NEUTROS ABS: 7 10*3/uL (ref 1.7–7.7)
NEUTROS PCT: 71 %
Platelets: 300 10*3/uL (ref 150–400)
RBC: 5.21 MIL/uL — AB (ref 3.87–5.11)
RDW: 12.1 % (ref 11.5–15.5)
WBC: 10 10*3/uL (ref 4.0–10.5)

## 2016-07-23 LAB — GLUCOSE, CAPILLARY
GLUCOSE-CAPILLARY: 229 mg/dL — AB (ref 65–99)
Glucose-Capillary: 288 mg/dL — ABNORMAL HIGH (ref 65–99)
Glucose-Capillary: 211 mg/dL — ABNORMAL HIGH (ref 65–99)
Glucose-Capillary: 216 mg/dL — ABNORMAL HIGH (ref 65–99)

## 2016-07-23 LAB — URINE MICROSCOPIC-ADD ON

## 2016-07-23 LAB — TROPONIN I
TROPONIN I: 0.04 ng/mL — AB (ref <0.03)
Troponin I: <0.03 ng/mL (ref <0.03)
Troponin I: <0.03 ng/mL (ref <0.03)

## 2016-07-23 LAB — URINALYSIS, ROUTINE W REFLEX MICROSCOPIC
Bilirubin Urine: NEGATIVE
HGB URINE DIPSTICK: NEGATIVE
Ketones, ur: 40 mg/dL — AB
Leukocytes, UA: NEGATIVE
Nitrite: NEGATIVE
Protein, ur: 30 mg/dL — AB
SPECIFIC GRAVITY, URINE: 1.025 (ref 1.005–1.030)
pH: 6.5 (ref 5.0–8.0)

## 2016-07-23 LAB — CBG MONITORING, ED: GLUCOSE-CAPILLARY: 320 mg/dL — AB (ref 65–99)

## 2016-07-23 LAB — LIPASE, BLOOD: Lipase: 33 U/L (ref 11–51)

## 2016-07-23 MED ORDER — HYDRALAZINE HCL 20 MG/ML IJ SOLN
10.0000 mg | Freq: Once | INTRAMUSCULAR | Status: AC
  Administered 2016-07-23: 10 mg via INTRAVENOUS
  Filled 2016-07-23: qty 1

## 2016-07-23 MED ORDER — ACETAMINOPHEN 325 MG PO TABS
650.0000 mg | ORAL_TABLET | Freq: Four times a day (QID) | ORAL | Status: DC | PRN
  Administered 2016-07-23 – 2016-07-26 (×3): 650 mg via ORAL
  Filled 2016-07-23 (×3): qty 2

## 2016-07-23 MED ORDER — HYDRALAZINE HCL 25 MG PO TABS: 50.0000 mg | ORAL_TABLET | Freq: Once | ORAL | Status: DC

## 2016-07-23 MED ORDER — SODIUM CHLORIDE 0.9 % IV BOLUS (SEPSIS)
1000.0000 mL | Freq: Once | INTRAVENOUS | Status: AC
  Administered 2016-07-23: 1000 mL via INTRAVENOUS

## 2016-07-23 MED ORDER — METOCLOPRAMIDE HCL 5 MG/ML IJ SOLN
10.0000 mg | Freq: Once | INTRAMUSCULAR | Status: AC
  Administered 2016-07-23: 10 mg via INTRAVENOUS
  Filled 2016-07-23: qty 2

## 2016-07-23 MED ORDER — METOCLOPRAMIDE HCL 5 MG/ML IJ SOLN
10.0000 mg | Freq: Four times a day (QID) | INTRAMUSCULAR | Status: DC
  Administered 2016-07-23 – 2016-07-26 (×14): 10 mg via INTRAVENOUS
  Filled 2016-07-23 (×14): qty 2

## 2016-07-23 MED ORDER — INSULIN ASPART 100 UNIT/ML ~~LOC~~ SOLN
0.0000 [IU] | SUBCUTANEOUS | Status: DC
  Administered 2016-07-23: 11 [IU] via SUBCUTANEOUS
  Filled 2016-07-23: qty 1

## 2016-07-23 MED ORDER — ONDANSETRON HCL 4 MG/2ML IJ SOLN
4.0000 mg | Freq: Four times a day (QID) | INTRAMUSCULAR | Status: DC | PRN
  Administered 2016-07-23: 4 mg via INTRAVENOUS
  Filled 2016-07-23: qty 2

## 2016-07-23 MED ORDER — ONDANSETRON HCL 4 MG/2ML IJ SOLN: 4.0000 mg | Freq: Three times a day (TID) | INTRAMUSCULAR | Status: DC | PRN

## 2016-07-23 MED ORDER — SODIUM CHLORIDE 0.9 % IV BOLUS (SEPSIS): 1000.0000 mL | Freq: Once | INTRAVENOUS | Status: DC

## 2016-07-23 MED ORDER — METOPROLOL TARTRATE 5 MG/5ML IV SOLN
10.0000 mg | Freq: Four times a day (QID) | INTRAVENOUS | Status: DC
  Administered 2016-07-23 – 2016-07-25 (×7): 10 mg via INTRAVENOUS
  Filled 2016-07-23 (×8): qty 10

## 2016-07-23 MED ORDER — BISACODYL 10 MG RE SUPP
10.0000 mg | Freq: Every day | RECTAL | Status: DC | PRN
  Administered 2016-07-25: 10 mg via RECTAL
  Filled 2016-07-23: qty 1

## 2016-07-23 MED ORDER — SODIUM CHLORIDE 0.9 % IV SOLN
INTRAVENOUS | Status: DC
  Administered 2016-07-23: 23:00:00 via INTRAVENOUS
  Administered 2016-07-23 – 2016-07-24 (×2): 1000 mL via INTRAVENOUS

## 2016-07-23 MED ORDER — TRAZODONE HCL 50 MG PO TABS: 25.0000 mg | ORAL_TABLET | Freq: Every evening | ORAL | Status: DC | PRN

## 2016-07-23 MED ORDER — ONDANSETRON HCL 4 MG PO TABS: 4.0000 mg | ORAL_TABLET | Freq: Four times a day (QID) | ORAL | Status: DC | PRN

## 2016-07-23 MED ORDER — SODIUM CHLORIDE 0.9% FLUSH
3.0000 mL | Freq: Two times a day (BID) | INTRAVENOUS | Status: DC
  Administered 2016-07-23 – 2016-07-26 (×6): 3 mL via INTRAVENOUS

## 2016-07-23 MED ORDER — ENOXAPARIN SODIUM 40 MG/0.4ML ~~LOC~~ SOLN
40.0000 mg | SUBCUTANEOUS | Status: DC
Start: 2016-07-23 — End: 2016-07-26
  Administered 2016-07-23 – 2016-07-26 (×4): 40 mg via SUBCUTANEOUS
  Filled 2016-07-23 (×4): qty 0.4

## 2016-07-23 MED ORDER — HYDRALAZINE HCL 20 MG/ML IJ SOLN
5.0000 mg | Freq: Four times a day (QID) | INTRAMUSCULAR | Status: DC | PRN
  Administered 2016-07-23 – 2016-07-24 (×2): 5 mg via INTRAVENOUS
  Filled 2016-07-23 (×2): qty 1

## 2016-07-23 MED ORDER — HYDRALAZINE HCL 20 MG/ML IJ SOLN
20.0000 mg | Freq: Once | INTRAMUSCULAR | Status: AC
  Administered 2016-07-23: 20 mg via INTRAVENOUS
  Filled 2016-07-23: qty 1

## 2016-07-23 MED ORDER — KETOROLAC TROMETHAMINE 30 MG/ML IJ SOLN
30.0000 mg | Freq: Four times a day (QID) | INTRAMUSCULAR | Status: DC | PRN
  Administered 2016-07-24 – 2016-07-26 (×3): 30 mg via INTRAVENOUS
  Filled 2016-07-23 (×3): qty 1

## 2016-07-23 MED ORDER — MORPHINE SULFATE (PF) 4 MG/ML IV SOLN
4.0000 mg | Freq: Once | INTRAVENOUS | Status: AC
  Administered 2016-07-23: 4 mg via INTRAVENOUS
  Filled 2016-07-23: qty 1

## 2016-07-23 MED ORDER — MAGNESIUM CITRATE PO SOLN: 1.0000 | Freq: Once | ORAL | Status: DC | PRN

## 2016-07-23 MED ORDER — INSULIN GLARGINE 100 UNIT/ML ~~LOC~~ SOLN
30.0000 [IU] | Freq: Every day | SUBCUTANEOUS | Status: DC
  Administered 2016-07-23 – 2016-07-26 (×4): 30 [IU] via SUBCUTANEOUS
  Filled 2016-07-23 (×4): qty 0.3

## 2016-07-23 MED ORDER — MAGNESIUM CITRATE PO SOLN
1.0000 | Freq: Once | ORAL | Status: AC
  Administered 2016-07-23: 1 via ORAL
  Filled 2016-07-23: qty 296

## 2016-07-23 MED ORDER — SENNOSIDES-DOCUSATE SODIUM 8.6-50 MG PO TABS
1.0000 | ORAL_TABLET | Freq: Every evening | ORAL | Status: DC | PRN
  Administered 2016-07-24: 1 via ORAL
  Filled 2016-07-23: qty 1

## 2016-07-23 MED ORDER — SODIUM CHLORIDE 0.9 % IV SOLN
INTRAVENOUS | Status: AC
  Administered 2016-07-23: 06:00:00 via INTRAVENOUS

## 2016-07-23 MED ORDER — ACETAMINOPHEN 650 MG RE SUPP: 650.0000 mg | Freq: Four times a day (QID) | RECTAL | Status: DC | PRN

## 2016-07-23 MED ORDER — FAMOTIDINE IN NACL 20-0.9 MG/50ML-% IV SOLN
20.0000 mg | Freq: Two times a day (BID) | INTRAVENOUS | Status: DC
  Administered 2016-07-23 – 2016-07-26 (×7): 20 mg via INTRAVENOUS
  Filled 2016-07-23 (×9): qty 50

## 2016-07-23 MED ORDER — HALOPERIDOL LACTATE 5 MG/ML IJ SOLN
2.0000 mg | Freq: Once | INTRAMUSCULAR | Status: AC
  Administered 2016-07-23: 2 mg via INTRAVENOUS
  Filled 2016-07-23: qty 1

## 2016-07-23 MED ORDER — INSULIN ASPART 100 UNIT/ML ~~LOC~~ SOLN
0.0000 [IU] | Freq: Three times a day (TID) | SUBCUTANEOUS | Status: DC
  Administered 2016-07-23 (×2): 3 [IU] via SUBCUTANEOUS
  Administered 2016-07-24 – 2016-07-26 (×8): 2 [IU] via SUBCUTANEOUS
  Administered 2016-07-26: 1 [IU] via SUBCUTANEOUS

## 2016-07-23 NOTE — ED Notes (Signed)
Patient transported to X-ray 

## 2016-07-23 NOTE — Progress Notes (Signed)
Admitting MD.  Dr. Nehemiah Settle in to see pt at this time.  Shevy Yaney, RN

## 2016-07-23 NOTE — Progress Notes (Signed)
Pt arrived to floor before 8am.  Introduced self to pt. As nurse 7am to llam.  Call bell at reach.  Instructed to call for assistance as needed.  Will continue to monitor.  Karie Kirks, Therapist, sports.

## 2016-07-23 NOTE — ED Triage Notes (Signed)
Pt reports n/v x 2 days. Pt states it "feelslike my gastroparesis" Pt also reports chest pain x 1 day.  Pt received 8mg  zofran iv pta with no relief.

## 2016-07-23 NOTE — Progress Notes (Signed)
Lab called in trop. 0.04 result.  Dr. Nehemiah Settle notified.  No new order.  Will continue to monitor.  Karie Kirks, Therapist, sports.

## 2016-07-23 NOTE — ED Provider Notes (Signed)
[facility] DEPT Provider Note   CSN: [id number] Arrival date & time: 07/23/16  [id number]  By signing my name below, I, Irene Pap, attest that this documentation has been prepared under the direction and in the presence of Orpah Greek, MD. Electronically Signed: Irene Pap, ED Scribe. 07/23/16. 1:04 AM.  First Provider Contact:  First MD Initiated Contact with Patient 07/23/16 0059      History   Chief Complaint Chief Complaint  Patient presents with  . Chest Pain  . Emesis   The history is provided by the EMS personnel and the patient. No language interpreter was used.  HPI Comments: Nancy [name] is a 54 y.o. Female with a hx of DM, HTN, IBS, and gastroparesis brought in by EMS who presents to the Emergency Department complaining of gradually worsening vomiting onset one day ago. Pt reports associated nausea, chest pain, and abdominal pain. Pt states that her symptoms feel similar to her gastroparesis, but the chest pain is new and began several hours ago. She has not tried anything for her symptoms. Pt has not taken her HTN medication PTA. She denies diarrhea.   Past Medical History:  Diagnosis Date  . Anemia 2006  . Depression 2014   previously on amitryptiline   . Diabetes mellitus with neurological manifestation ([place]) 2006  . Fracture of left ankle 1997   . HOH (hard of hearing) 2004   . Hyperlipidemia 2006  . Hypertension 2006  . IBS (irritable bowel syndrome) 2002   . Leukopenia 2015   . Shingles 2009   . Thyroid nodule 2004    Patient Active Problem List   Diagnosis Date Noted  . Low back pain 09/21/2014  . Hypertension 09/21/2014  . Diabetic gastroparesis associated with type 2 diabetes mellitus ([place]) 09/14/2014  . Acute esophagitis 08/26/2014  . Thyroid nodule 08/17/2014  . Obesity (BMI 30-39.9) 08/17/2014  . Type II diabetes mellitus with neurological manifestations, uncontrolled ([place]) 08/04/2014  . Elevated serum protein level  08/04/2014    Past Surgical History:  Procedure Laterality Date  . ABDOMINAL HYSTERECTOMY  2005  . CESAREAN SECTION  1983   . ESOPHAGOGASTRODUODENOSCOPY (EGD) WITH PROPOFOL Left 08/26/2014   Procedure: ESOPHAGOGASTRODUODENOSCOPY (EGD) WITH PROPOFOL;  Surgeon: Arta Silence, MD;  Location: [facility];  Service: Endoscopy;  Laterality: Left;    OB History    No data available       Home Medications    Prior to Admission medications   Medication Sig Start Date End Date Taking? Authorizing Provider  acetaminophen (TYLENOL) 500 MG tablet Take 1,000 mg by mouth every 6 (six) hours as needed for mild pain.    Historical Provider, MD  amLODipine (NORVASC) 10 MG tablet Take 1 tablet (10 mg total) by mouth daily. 09/14/14   Nishant Dhungel, MD  atorvastatin (LIPITOR) 40 MG tablet Take 1 tablet (40 mg total) by mouth daily. 08/17/14   Josalyn Funches, MD  cyclobenzaprine (FLEXERIL) 10 MG tablet Take 1 tablet (10 mg total) by mouth 3 (three) times daily as needed for muscle spasms. 09/21/14   Josalyn Funches, MD  dicyclomine (BENTYL) 20 MG tablet Take 1 tablet (20 mg total) by mouth every 8 (eight) hours as needed for spasms. 08/21/14   Jola Schmidt, MD  feeding supplement, RESOURCE BREEZE, (RESOURCE BREEZE) LIQD Take 1 Container by mouth 3 (three) times daily between meals. 09/14/14   Nishant Dhungel, MD  hydrALAZINE (APRESOLINE) 50 MG tablet Take 2 tablets (100 mg total) by mouth 3 (three) times daily.  09/14/14   Nishant Dhungel, MD  insulin aspart protamine- aspart (NOVOLOG MIX 70/30) (70-30) 100 UNIT/ML injection Inject 0.4 mLs (40 Units total) into the skin 2 (two) times daily with a meal. 09/14/14   Nishant Dhungel, MD  lisinopril-hydrochlorothiazide (PRINZIDE,ZESTORETIC) 20-25 MG per tablet Take 1 tablet by mouth daily. 08/08/14   Venetia Maxon Rama, MD  metoCLOPramide (REGLAN) 10 MG tablet Take 1 tablet (10 mg total) by mouth every 6 (six) hours as needed for nausea or vomiting. 09/14/14   Nishant  Dhungel, MD  pantoprazole (PROTONIX) 40 MG tablet Take 1 tablet (40 mg total) by mouth daily. 08/26/14   Nishant Dhungel, MD  sitaGLIPtin (JANUVIA) 100 MG tablet Take 1 tablet (100 mg total) by mouth daily. 09/21/14   Boykin Nearing, MD    Family History Family History  Problem Relation Age of Onset  . Hypertension Mother   . Heart disease Mother   . Diabetes Mother   . Thyroid disease Mother   . Congestive Heart Failure Mother   . Breast cancer Maternal Grandmother   . Colon cancer Maternal Grandfather   . Heart attack Sister   . Heart disease Brother   . Hyperlipidemia Brother   . Hypertension Brother   . Diabetes Father   . Breast cancer Maternal Aunt     Social History Social History  Substance Use Topics  . Smoking status: Never Smoker  . Smokeless tobacco: Never Used  . Alcohol use Yes     Comment: occasion      Allergies   Hydrocodone; Metformin and related; and Sulfur   Review of Systems Review of Systems  Cardiovascular: Positive for chest pain.  Gastrointestinal: Positive for abdominal pain, nausea and vomiting. Negative for diarrhea.  All other systems reviewed and are negative.    Physical Exam Updated Vital Signs BP (!) 198/113   Pulse 93   Resp (!) 0   Ht 5\' 3"  (1.6 m)   Wt 182 lb (82.6 kg)   SpO2 99%   BMI 32.24 kg/m   Physical Exam  Constitutional: She is oriented to person, place, and time. She appears well-developed and well-nourished. No distress.  HENT:  Head: Normocephalic and atraumatic.  Right Ear: Hearing normal.  Left Ear: Hearing normal.  Nose: Nose normal.  Mouth/Throat: Oropharynx is clear and moist and mucous membranes are normal.  Eyes: Conjunctivae and EOM are normal. Pupils are equal, round, and reactive to light.  Neck: Normal range of motion. Neck supple.  Cardiovascular: Regular rhythm, S1 normal and S2 normal.  Exam reveals no gallop and no friction rub.   No murmur heard. Pulmonary/Chest: Effort normal and breath  sounds normal. No respiratory distress. She exhibits tenderness. She exhibits no crepitus.  Central and left upper chest wall tenderness   Abdominal: Soft. Normal appearance and bowel sounds are normal. There is no hepatosplenomegaly. There is generalized tenderness. There is no rebound, no guarding, no tenderness at McBurney's point and negative Murphy's sign. No hernia.  Musculoskeletal: Normal range of motion.  Neurological: She is alert and oriented to person, place, and time. She has normal strength. No cranial nerve deficit or sensory deficit. Coordination normal. GCS eye subscore is 4. GCS verbal subscore is 5. GCS motor subscore is 6.  Skin: Skin is warm, dry and intact. No rash noted. No cyanosis.  Psychiatric: She has a normal mood and affect. Her speech is normal and behavior is normal. Thought content normal.  Nursing note and vitals reviewed.    ED Treatments /  Results   DIAGNOSTIC STUDIES: Oxygen Saturation is 99% on RA, normal by my interpretation.    COORDINATION OF CARE: 1:04 AM-Discussed treatment plan which includes labs, EKG, and x-ray with pt at bedside and pt agreed to plan.   Labs (all labs ordered are listed, but only abnormal results are displayed) Labs Reviewed  CBC WITH DIFFERENTIAL/PLATELET - Abnormal; Notable for the following:       Result Value   RBC 5.21 (*)    All other components within normal limits  COMPREHENSIVE METABOLIC PANEL - Abnormal; Notable for the following:    Sodium 131 (*)    Chloride 99 (*)    Glucose, Bld 346 (*)    Total Bilirubin 1.3 (*)    All other components within normal limits  URINALYSIS, ROUTINE W REFLEX MICROSCOPIC (NOT AT [facility]) - Abnormal; Notable for the following:    Glucose, UA >1000 (*)    Ketones, ur 40 (*)    Protein, ur 30 (*)    All other components within normal limits  URINE MICROSCOPIC-ADD ON - Abnormal; Notable for the following:    Squamous Epithelial / LPF 0-5 (*)    Bacteria, UA RARE (*)    All other  components within normal limits  TROPONIN I  LIPASE, BLOOD    EKG  EKG Interpretation  Date/Time:  Monday July 23 2016 01:09:09 EDT Ventricular Rate:  114 PR Interval:    QRS Duration: 85 QT Interval:  347 QTC Calculation: 478 R Axis:   -58 Text Interpretation:  Sinus tachycardia Inferior infarct, old Consider anterior infarct Baseline wander in lead(s) II III aVR aVF V1 No significant change since last tracing Confirmed by Stephan Draughn  MD, Ron Beske [phone]) on 07/23/2016 1:15:17 AM       Radiology Dg Abd Acute W/chest  Result Date: 07/23/2016 CLINICAL DATA:  Acute onset of generalized chest and abdominal pain. Vomiting. Initial encounter. EXAM: DG ABDOMEN ACUTE W/ 1V CHEST COMPARISON:  CT of the abdomen and pelvis performed 08/24/2014, and chest radiograph performed 08/22/2014 FINDINGS: The lungs are well-aerated and clear. There is no evidence of focal opacification, pleural effusion or pneumothorax. The cardiomediastinal silhouette is within normal limits. The visualized bowel gas pattern is unremarkable. Scattered stool and air are seen within the colon; there is no evidence of small bowel dilatation to suggest obstruction. No free intra-abdominal air is identified on the provided upright view. No acute osseous abnormalities are seen; the sacroiliac joints are unremarkable in appearance. IMPRESSION: 1. Unremarkable bowel gas pattern; no free intra-abdominal air seen. 2. No acute cardiopulmonary process seen. Electronically Signed   By: Garald Balding M.D.   On: 07/23/2016 01:33    Procedures Procedures (including critical care time)  Medications Ordered in ED Medications - No data to display   Initial Impression / Assessment and Plan / ED Course  I have reviewed the triage vital signs and the nursing notes.  Pertinent labs & imaging results that were available during my care of the patient were reviewed by me and considered in my medical decision making (see chart for  details).  Clinical Course   Patient presents with complaints of abdominal pain with nausea and vomiting. This has been ongoing for 2 days. She developed chest pain earlier tonight after vomiting. Patient has a history of gastroparesis. She reports that the abdominal pain nausea and vomiting she is experiencing his typical of her gastroparesis. The chest pain, however, is unusual. Patient does have tenderness to palpation. EKG and troponin unremarkable. Symptoms  likely chest wall in nature secondary to the vomiting.  She was very hypertensive at arrival. She does have a history of hypertension. She has not been taking her medications. Blood pressure did come down with IV hydralazine. Attempts at oral challenge and oral medications were unsuccessful. Blood pressure is now coming back up and she continues to have nausea and vomiting after 8 mg of Zofran, 10 mg of Reglan, 2 mg of Haldol IV. Patient will require hospitalization for further management of gastroparesis and hypertension.  Final Clinical Impressions(s) / ED Diagnoses   Final diagnoses:  None   I personally performed the services described in this documentation, which was scribed in my presence. The recorded information has been reviewed and is accurate.    New Prescriptions New Prescriptions   No medications on file     Orpah Greek, MD 07/23/16 [phone]

## 2016-07-23 NOTE — H&P (Signed)
History and Physical    Nancy [name] LKT:[id number] DOB: February 08, 1961 DOA: 07/23/2016   PCP: Minerva Ends, MD   Patient coming from:  Home  Chief Complaint: Nausea and vomiting   HPI: Nancy [name] is a 55 y.o. female with medical history significant for hypertension, depression, anemia, IBS, diabetes with diabetic gastroparesis with  ED visit on 8/5 for same, brought EMS with 2 day history of gradual nausea and vomiting,abdominal pain, and chest wall pain. She reports this symptoms to be similar to her know gastroparesis, but her chest wall pain was new, and lasting several hours, for which she sought medical attention. She had not tried anything for her symptoms at home. Denies diarrhea. Denies any sick contacts. Denies any fever, chills or night sweats.Denies any dizziness or vertigo or headaches. Denies any rashes or cuts. Denies abdominal surgery for obstruction.  No recent antibiotics or changes in his meds. She had a similar episode in 2015, requiring hospitalization, at which time in EGD showed mild distal esophagitis, without any other pathologies. At the time, she was treated with twice a day PPI, with improvement of her symptoms.   ED Course:  BP (!) 166/103   Pulse (!) 133   Resp 18   Ht '5\' 3"'$  (1.6 m)   Wt 82.6 kg (182 lb)   SpO2 100%   BMI 32.24 kg/m    sodium 131 currently generally 1.3 glucose 346 EKG with sinus tachycardia, without significant changes since last tracing, QTc 478.n troponin negative. Her blood pressure was elevated on presentation for which she received hydralazine IV, with initial improvement, later to increase again. Her oral medications were unsuccessful. She received Zofran IV, Reglan, and Haldol IV with initial improvement of her symptoms.  Review of Systems: As per HPI otherwise 10 point review of systems negative.    Past Medical History:  Diagnosis Date  . Anemia 2006  . Depression 2014   previously on amitryptiline   .  Diabetes mellitus with neurological manifestation ([place]) 2006  . Fracture of left ankle 1997   . HOH (hard of hearing) 2004   . Hyperlipidemia 2006  . Hypertension 2006  . IBS (irritable bowel syndrome) 2002   . Leukopenia 2015   . Shingles 2009   . Thyroid nodule 2004    Past Surgical History:  Procedure Laterality Date  . ABDOMINAL HYSTERECTOMY  2005  . CESAREAN SECTION  1983   . ESOPHAGOGASTRODUODENOSCOPY (EGD) WITH PROPOFOL Left 08/26/2014   Procedure: ESOPHAGOGASTRODUODENOSCOPY (EGD) WITH PROPOFOL;  Surgeon: Arta Silence, MD;  Location: [facility];  Service: Endoscopy;  Laterality: Left;    Social History Social History   Social History  . Marital status: Married    Spouse name: [name]  . Number of children: 1  . Years of education: some colle   Occupational History  . Employed FT as Animator    Social History Main Topics  . Smoking status: Never Smoker  . Smokeless tobacco: Never Used  . Alcohol use Yes     Comment: occasion   . Drug use: No  . Sexual activity: Yes    Birth control/ protection: None   Other Topics Concern  . Not on file   Social History Narrative   Married.  Lives with husband and daughter.  Works full time in Engineer, structural (high stress).   Caring for mother who's health is failing, starting dialysis.      Allergies  Allergen Reactions  .  Hydrocodone Other (See Comments)    Upset stomach  . Metformin And Related Nausea And Vomiting and Other (See Comments)    Stomach pains   . Sulfur Hives    Family History  Problem Relation Age of Onset  . Hypertension Mother   . Heart disease Mother   . Diabetes Mother   . Thyroid disease Mother   . Congestive Heart Failure Mother   . Breast cancer Maternal Grandmother   . Colon cancer Maternal Grandfather   . Heart attack Sister   . Heart disease Brother   . Hyperlipidemia Brother   . Hypertension Brother   . Diabetes Father   . Breast cancer Maternal  Aunt       Prior to Admission medications   Medication Sig Start Date End Date Taking? Authorizing Provider  acetaminophen (TYLENOL) 500 MG tablet Take 1,000 mg by mouth every 6 (six) hours as needed for mild pain.   Yes Historical Provider, MD  insulin aspart protamine- aspart (NOVOLOG MIX 70/30) (70-30) 100 UNIT/ML injection Inject 0.4 mLs (40 Units total) into the skin 2 (two) times daily with a meal. Patient taking differently: Inject 40 Units into the skin daily.  09/14/14  Yes Nishant Dhungel, MD    Physical Exam:    Vitals:   07/23/16 0615 07/23/16 0630 07/23/16 0645 07/23/16 0726  BP:  165/98 172/91 (!) 166/103  Pulse: 118 (!) 121 119 (!) 133  Resp: '20 14 15 18  '$ SpO2: 100% 98% 97% 100%  Weight:      Height:           Constitutional: uncomfortable, acutely ill appearing  Vitals:   07/23/16 0615 07/23/16 0630 07/23/16 0645 07/23/16 0726  BP:  165/98 172/91 (!) 166/103  Pulse: 118 (!) 121 119 (!) 133  Resp: '20 14 15 18  '$ SpO2: 100% 98% 97% 100%  Weight:      Height:       Eyes: PERRL, lids and conjunctivae normal ENMT: Mucous membranes are moist. Posterior pharynx clear of any exudate or lesions.Normal dentition.  Neck: normal, supple, no masses, no thyromegaly Respiratory: clear to auscultation bilaterally, no wheezing, no crackles. Normal respiratory effort. No accessory muscle use.  Cardiovascular:  Tachy Regular rate and rhythm, no murmurs / rubs / gallops. No extremity edema. 2+ pedal pulses. No carotid bruits.  Abdomen: no tenderness, no masses palpated. No hepatosplenomegaly. Bowel sounds positive.  Musculoskeletal: no clubbing / cyanosis. No joint deformity upper and lower extremities. Good ROM, no contractures. Normal muscle tone.   Skin: no rashes, lesions, ulcers.  Neurologic: CN 2-12 grossly intact. Sensation intact, DTR normal. Strength 5/5 in all 4.  Psychiatric: Normal judgment and insight. Alert and oriented x 3.    Labs on Admission: I have  personally reviewed following labs and imaging studies  CBC:  Recent Labs Lab 07/21/16 2033 07/23/16 0118  WBC 11.7* 10.0  NEUTROABS  --  7.0  HGB 15.0 14.9  HCT 43.0 42.4  MCV 81.7 81.4  PLT 320 175    Basic Metabolic Panel:  Recent Labs Lab 07/21/16 1953 07/23/16 0118  NA 133* 131*  K 3.8 4.4  CL 95* 99*  CO2 26 23  GLUCOSE 325* 346*  BUN 15 8  CREATININE 0.78 0.73  CALCIUM 9.3 9.5    GFR: Estimated Creatinine Clearance: 80 mL/min (by C-G formula based on SCr of 0.8 mg/dL).  Liver Function Tests:  Recent Labs Lab 07/21/16 1953 07/23/16 0118  AST 15 27  ALT 14  15  ALKPHOS 86 82  BILITOT 0.9 1.3*  PROT 8.4* 7.6  ALBUMIN 4.2 3.9    Recent Labs Lab 07/21/16 1953 07/23/16 0118  LIPASE 36 33   No results for input(s): AMMONIA in the last 168 hours.  Coagulation Profile: No results for input(s): INR, PROTIME in the last 168 hours.  Cardiac Enzymes:  Recent Labs Lab 07/23/16 0118 07/23/16 0614  TROPONINI <0.03 <0.03    BNP (last 3 results) No results for input(s): PROBNP in the last 8760 hours.  HbA1C: No results for input(s): HGBA1C in the last 72 hours.  CBG:  Recent Labs Lab 07/21/16 2116 07/23/16 0512  GLUCAP 316* 320*    Lipid Profile: No results for input(s): CHOL, HDL, LDLCALC, TRIG, CHOLHDL, LDLDIRECT in the last 72 hours.  Thyroid Function Tests: No results for input(s): TSH, T4TOTAL, FREET4, T3FREE, THYROIDAB in the last 72 hours.  Anemia Panel: No results for input(s): VITAMINB12, FOLATE, FERRITIN, TIBC, IRON, RETICCTPCT in the last 72 hours.  Urine analysis:    Component Value Date/Time   COLORURINE YELLOW 07/23/2016 0213   APPEARANCEUR CLEAR 07/23/2016 0213   LABSPEC 1.025 07/23/2016 0213   PHURINE 6.5 07/23/2016 0213   GLUCOSEU >1000 (A) 07/23/2016 0213   HGBUR NEGATIVE 07/23/2016 0213   BILIRUBINUR NEGATIVE 07/23/2016 0213   KETONESUR 40 (A) 07/23/2016 0213   PROTEINUR 30 (A) 07/23/2016 0213    UROBILINOGEN 0.2 09/07/2014 0857   NITRITE NEGATIVE 07/23/2016 0213   LEUKOCYTESUR NEGATIVE 07/23/2016 0213    Sepsis Labs: '@LABRCNTIP'$ (procalcitonin:4,lacticidven:4) )No results found for this or any previous visit (from the past 240 hour(s)).   Radiological Exams on Admission: Dg Abd Acute W/chest  Result Date: 07/23/2016 CLINICAL DATA:  Acute onset of generalized chest and abdominal pain. Vomiting. Initial encounter. EXAM: DG ABDOMEN ACUTE W/ 1V CHEST COMPARISON:  CT of the abdomen and pelvis performed 08/24/2014, and chest radiograph performed 08/22/2014 FINDINGS: The lungs are well-aerated and clear. There is no evidence of focal opacification, pleural effusion or pneumothorax. The cardiomediastinal silhouette is within normal limits. The visualized bowel gas pattern is unremarkable. Scattered stool and air are seen within the colon; there is no evidence of small bowel dilatation to suggest obstruction. No free intra-abdominal air is identified on the provided upright view. No acute osseous abnormalities are seen; the sacroiliac joints are unremarkable in appearance. IMPRESSION: 1. Unremarkable bowel gas pattern; no free intra-abdominal air seen. 2. No acute cardiopulmonary process seen. Electronically Signed   By: Garald Balding M.D.   On: 07/23/2016 01:33    EKG: Independently reviewed.  Assessment/Plan Active Problems:   Type II diabetes mellitus with neurological manifestations, uncontrolled ([place])   Uncontrolled hypertension   Gastroparesis due to DM ([facility])   Elevated bilirubin   Intractable nausea and vomiting likely due to diabetic gastroparesis. No abdominal pain. Afebrile. WBC normal at 10. K 4.4  No bleeding issues. UA unremarkable for infection. Abd XR without acute findings. Received IV Zofran and Reglan with some relief. Symptoms continue to be present.   -Admit to telemetry obs -Bowel rest, advance as tolerated -Scheduled Zofran Continue Reglan IV  IV PPIs (Pepcid 20 mg IV  bid)  IV fluids If symptoms do not improve will consider Abdominal US   Hypertension BP (!) 166/103   Pulse (!) 133   Resp 18   Ht '5\' 3"'$  (1.6 m)   Wt 82.6 kg (182 lb)   SpO2 100%   BMI 32.24 kg/m . Patient not on home antihypertensives. This is poorly  controlled. Denies headaches. Received Hydralazine with temporary relief.  Continue  Hydralazine Q6 hours as needed for BP 160/90  Other agents may be added prior to discharge  Chest wall pain, reproducible, Tn neg, EKG without ACS. XR negative for acute process. Received pain meds with resolution of symptoms Continue supportive therapy   Toradol for severe pain    Type II Diabetes with diabetic gastroparesis Current blood sugar level is 346. Not on oral agents as OP. Lab Results  Component Value Date   HGBA1C 13.4 (H) 08/04/2014  Hgb A1C  SSI Add Lantus 30 sq  Daily  Heart healthy carb modified diet.     Elevated bilirubin in the setting of dehydration, currently at 1.3, with normal AST/ALT 27/15, Alk Phos 82  No history of  Hepatitis  IVG Repeat CMET in am  IVF    Hyperlipidemia, not followed up as OP Check lipid panel   Hyponatremia  likely due to vomiting. No neuro deficits, expected to recover with NS . Current Na 131  Continue IVF Check CMET in am    DVT prophylaxis: Lovenox Code Status:   Full    Family Communication:  Discussed with patient  husband  Disposition Plan: Expect patient to be discharged to home after condition improves Consults called:    None Admission status:Tele  Obs   Aero Drummonds E, PA-C [facility]   If 7PM-7AM, please contact night-coverage [email or web address] Password [email or web address]  07/23/2016, 8:05 AM

## 2016-07-23 NOTE — ED Notes (Signed)
Attempted to call report

## 2016-07-24 DIAGNOSIS — R739 Hyperglycemia, unspecified: Secondary | ICD-10-CM | POA: Diagnosis present

## 2016-07-24 DIAGNOSIS — R112 Nausea with vomiting, unspecified: Secondary | ICD-10-CM

## 2016-07-24 DIAGNOSIS — D72829 Elevated white blood cell count, unspecified: Secondary | ICD-10-CM | POA: Diagnosis present

## 2016-07-24 DIAGNOSIS — E876 Hypokalemia: Secondary | ICD-10-CM | POA: Diagnosis not present

## 2016-07-24 DIAGNOSIS — E1143 Type 2 diabetes mellitus with diabetic autonomic (poly)neuropathy: Secondary | ICD-10-CM | POA: Diagnosis present

## 2016-07-24 DIAGNOSIS — K3184 Gastroparesis: Secondary | ICD-10-CM | POA: Diagnosis present

## 2016-07-24 DIAGNOSIS — R1110 Vomiting, unspecified: Secondary | ICD-10-CM

## 2016-07-24 DIAGNOSIS — G43A1 Cyclical vomiting, intractable: Secondary | ICD-10-CM | POA: Diagnosis not present

## 2016-07-24 DIAGNOSIS — H9190 Unspecified hearing loss, unspecified ear: Secondary | ICD-10-CM | POA: Diagnosis present

## 2016-07-24 DIAGNOSIS — I10 Essential (primary) hypertension: Secondary | ICD-10-CM | POA: Diagnosis present

## 2016-07-24 DIAGNOSIS — E785 Hyperlipidemia, unspecified: Secondary | ICD-10-CM | POA: Diagnosis present

## 2016-07-24 DIAGNOSIS — Z794 Long term (current) use of insulin: Secondary | ICD-10-CM | POA: Diagnosis not present

## 2016-07-24 DIAGNOSIS — K589 Irritable bowel syndrome without diarrhea: Secondary | ICD-10-CM | POA: Diagnosis present

## 2016-07-24 DIAGNOSIS — E1149 Type 2 diabetes mellitus with other diabetic neurological complication: Secondary | ICD-10-CM | POA: Diagnosis present

## 2016-07-24 DIAGNOSIS — E869 Volume depletion, unspecified: Secondary | ICD-10-CM | POA: Diagnosis present

## 2016-07-24 DIAGNOSIS — E1140 Type 2 diabetes mellitus with diabetic neuropathy, unspecified: Secondary | ICD-10-CM | POA: Diagnosis not present

## 2016-07-24 DIAGNOSIS — E1165 Type 2 diabetes mellitus with hyperglycemia: Secondary | ICD-10-CM | POA: Diagnosis present

## 2016-07-24 DIAGNOSIS — E871 Hypo-osmolality and hyponatremia: Secondary | ICD-10-CM | POA: Diagnosis present

## 2016-07-24 LAB — COMPREHENSIVE METABOLIC PANEL
ALK PHOS: 73 U/L (ref 38–126)
ALT: 11 U/L — AB (ref 14–54)
AST: 13 U/L — AB (ref 15–41)
Albumin: 3.5 g/dL (ref 3.5–5.0)
Anion gap: 8 (ref 5–15)
BUN: 7 mg/dL (ref 6–20)
CALCIUM: 9 mg/dL (ref 8.9–10.3)
CHLORIDE: 99 mmol/L — AB (ref 101–111)
CO2: 28 mmol/L (ref 22–32)
CREATININE: 0.71 mg/dL (ref 0.44–1.00)
GFR calc Af Amer: >60 mL/min (ref >60)
Glucose, Bld: 177 mg/dL — ABNORMAL HIGH (ref 65–99)
Potassium: 3.6 mmol/L (ref 3.5–5.1)
Sodium: 135 mmol/L (ref 135–145)
Total Bilirubin: 0.7 mg/dL (ref 0.3–1.2)
Total Protein: 7.3 g/dL (ref 6.5–8.1)

## 2016-07-24 LAB — LIPID PANEL
CHOLESTEROL: 196 mg/dL (ref 0–200)
HDL: 40 mg/dL — ABNORMAL LOW (ref >40)
LDL Cholesterol: 133 mg/dL — ABNORMAL HIGH (ref 0–99)
TRIGLYCERIDES: 116 mg/dL (ref <150)
Total CHOL/HDL Ratio: 4.9 RATIO
VLDL: 23 mg/dL (ref 0–40)

## 2016-07-24 LAB — GLUCOSE, CAPILLARY
GLUCOSE-CAPILLARY: 193 mg/dL — AB (ref 65–99)
GLUCOSE-CAPILLARY: 182 mg/dL — AB (ref 65–99)
GLUCOSE-CAPILLARY: 215 mg/dL — AB (ref 65–99)
Glucose-Capillary: 188 mg/dL — ABNORMAL HIGH (ref 65–99)

## 2016-07-24 LAB — TROPONIN I

## 2016-07-24 LAB — HEMOGLOBIN A1C
Hgb A1c MFr Bld: 13.3 % — ABNORMAL HIGH (ref 4.8–5.6)
Mean Plasma Glucose: 335 mg/dL

## 2016-07-24 LAB — CBC
HCT: 40.6 % (ref 36.0–46.0)
HEMOGLOBIN: 14 g/dL (ref 12.0–15.0)
MCH: 28.5 pg (ref 26.0–34.0)
MCHC: 34.5 g/dL (ref 30.0–36.0)
MCV: 82.7 fL (ref 78.0–100.0)
Platelets: 343 10*3/uL (ref 150–400)
RBC: 4.91 MIL/uL (ref 3.87–5.11)
RDW: 12.3 % (ref 11.5–15.5)
WBC: 17 10*3/uL — ABNORMAL HIGH (ref 4.0–10.5)

## 2016-07-24 MED ORDER — SODIUM CHLORIDE 0.9 % IV SOLN
INTRAVENOUS | Status: DC
  Administered 2016-07-24: 15:00:00 via INTRAVENOUS
  Filled 2016-07-24 (×3): qty 1000

## 2016-07-24 NOTE — Progress Notes (Addendum)
PROGRESS NOTE  Nancy [name] [id number] DOB: Aug 10, 1961 DOA: 07/23/2016 PCP: Minerva Ends, MD  Brief History:  55 year old female with history of diabetes mellitus, hypertension, depression presented with 3 day history of nausea, vomiting, abdominal pain. The patient stated that her symptoms continued to worsen and continued to have intractable vomiting without any blood. She denies any sick contacts or unusual or undercooked foods. Notably, the patient states that she has been off of her antihypertensive medications for the past 6 months, and she has been trying to "stretch out" her Lantus pens for the past 2-3 month but not using her insulin every day. She denied any fevers, chills, chest discomfort, headache, neck pain, rashes, synovitis. Acute abdominal series was unremarkable. BMP and LFTs were unremarkable. The patient was started on clear liquid diet and intravenous fluids.  Assessment/Plan: Intractable nausea and vomiting -Secondary to exacerbation of diabetic gastroparesis -09/13/2014 gastric emptying study--borderline abnormal -The patient's uncontrolled diabetes has likely contributed or exacerbated her gastroparesis -Continue clear liquid diet -Continue metoclopramide -07/24/2016--still feels nauseous but no vomiting -07/23/16-Acute abdominal series unremarkable -continue famotidine -08/26/2014 EGD--mild distal esophagitis  Atypical chest pain -Likely secondary to the patient's vomiting -EKG without concerning ischemic changes -Troponins unremarkable  Hypertension -Previously taking hydralazine, but has not taken in over 6 months -Continue intravenous Lopressor until able to tolerate po reliably  Diabetes mellitus type 2 -07/23/2016 Hemoglobin A1c 13.3 -Previously took Lantus 44 units daily -Continue Lantus 30 units for now and adjust based on CBGs -NovoLog sliding scale--switch to moderate scale  Hyperlipidemia -Start atorvastatin when able to  take po -LDL 133  Hyponatremia -Secondary to volume depletion -Improving with intravenous fluids  Leukocytosis -Likely stress demargination -Afebrile hemodynamically stable -A.m. CBC    Disposition Plan:   Home in 1-2 days  Family Communication:   No Family at bedside--Total time spent 35 minutes.  Greater than 50% spent face to face counseling and coordinating care.  Previous records reviewed and summarized  Consultants:  none  Code Status:  FULL   DVT Prophylaxis: [place] Lovenox   Procedures: As Listed in Progress Note Above  Antibiotics: None    Subjective: She states that her vomiting is improved. She still feels nauseous. Denies any fevers, chills, chest pain, shortness breath, abdominal pain, dysuria, hematuria. No rashes. No synovitis or headache.  Objective: Vitals:   07/24/16 0546 07/24/16 0856 07/24/16 1138 07/24/16 1300  BP: (!) 157/89 (!) 163/94 (!) 199/101 (!) 213/91  Pulse: 95 86 90 98  Resp: 18 18 18    Temp: 98.4 F (36.9 C) 99 F (37.2 C) 98.4 F (36.9 C)   TempSrc: Oral Oral Oral   SpO2: 99% 100% 99%   Weight: 84.8 kg (187 lb)     Height:        Intake/Output Summary (Last 24 hours) at 07/24/16 1329 Last data filed at 07/24/16 0855  Gross per 24 hour  Intake           2696.5 ml  Output             2150 ml  Net            546.5 ml   Weight change: 1.406 kg (3 lb 1.6 oz) Exam:   General:  Pt is alert, follows commands appropriately, not in acute distress  HEENT: No icterus, No thrush, No neck mass, /AT  Cardiovascular: RRR, S1/S2, no rubs, no gallops  Respiratory: CTA bilaterally, no wheezing, no crackles,  no rhonchi  Abdomen: Soft/+BS, non tender, non distended, no guarding  Extremities: No edema, No lymphangitis, No petechiae, No rashes, no synovitis   Data Reviewed: I have personally reviewed following labs and imaging studies Basic Metabolic Panel:  Recent Labs Lab 07/21/16 1953 07/23/16 0118 07/24/16 0028  NA 133*  131* 135  K 3.8 4.4 3.6  CL 95* 99* 99*  CO2 26 23 28   GLUCOSE 325* 346* 177*  BUN 15 8 7   CREATININE 0.78 0.73 0.71  CALCIUM 9.3 9.5 9.0   Liver Function Tests:  Recent Labs Lab 07/21/16 1953 07/23/16 0118 07/24/16 0028  AST 15 27 13*  ALT 14 15 11*  ALKPHOS 86 82 73  BILITOT 0.9 1.3* 0.7  PROT 8.4* 7.6 7.3  ALBUMIN 4.2 3.9 3.5    Recent Labs Lab 07/21/16 1953 07/23/16 0118  LIPASE 36 33   No results for input(s): AMMONIA in the last 168 hours. Coagulation Profile: No results for input(s): INR, PROTIME in the last 168 hours. CBC:  Recent Labs Lab 07/21/16 2033 07/23/16 0118 07/24/16 0028  WBC 11.7* 10.0 17.0*  NEUTROABS  --  7.0  --   HGB 15.0 14.9 14.0  HCT 43.0 42.4 40.6  MCV 81.7 81.4 82.7  PLT 320 300 343   Cardiac Enzymes:  Recent Labs Lab 07/23/16 0118 07/23/16 0614 07/23/16 1154 07/23/16 1734 07/24/16 0019  TROPONINI <0.03 <0.03 0.04* <0.03 <0.03   BNP: Invalid input(s): POCBNP CBG:  Recent Labs Lab 07/23/16 1217 07/23/16 1623 07/23/16 2044 07/24/16 0630 07/24/16 1136  GLUCAP 229* 211* 216* 188* 193*   HbA1C:  Recent Labs  07/23/16 0118  HGBA1C 13.3*   Urine analysis:    Component Value Date/Time   COLORURINE YELLOW 07/23/2016 0213   APPEARANCEUR CLEAR 07/23/2016 0213   LABSPEC 1.025 07/23/2016 0213   PHURINE 6.5 07/23/2016 0213   GLUCOSEU >1000 (A) 07/23/2016 0213   HGBUR NEGATIVE 07/23/2016 0213   BILIRUBINUR NEGATIVE 07/23/2016 0213   KETONESUR 40 (A) 07/23/2016 0213   PROTEINUR 30 (A) 07/23/2016 0213   UROBILINOGEN 0.2 09/07/2014 0857   NITRITE NEGATIVE 07/23/2016 0213   LEUKOCYTESUR NEGATIVE 07/23/2016 0213   Sepsis Labs: @LABRCNTIP (procalcitonin:4,lacticidven:4) )No results found for this or any previous visit (from the past 240 hour(s)).   Scheduled Meds: . enoxaparin (LOVENOX) injection  40 mg Subcutaneous Q24H  . famotidine (PEPCID) IV  20 mg Intravenous Q12H  . insulin aspart  0-9 Units Subcutaneous  TID WC  . insulin glargine  30 Units Subcutaneous Daily  . metoCLOPramide (REGLAN) injection  10 mg Intravenous Q6H  . metoprolol  10 mg Intravenous Q6H  . sodium chloride flush  3 mL Intravenous Q12H   Continuous Infusions: . sodium chloride 1,000 mL (07/24/16 0902)    Procedures/Studies: Dg Abd Acute W/chest  Result Date: 07/23/2016 CLINICAL DATA:  Acute onset of generalized chest and abdominal pain. Vomiting. Initial encounter. EXAM: DG ABDOMEN ACUTE W/ 1V CHEST COMPARISON:  CT of the abdomen and pelvis performed 08/24/2014, and chest radiograph performed 08/22/2014 FINDINGS: The lungs are well-aerated and clear. There is no evidence of focal opacification, pleural effusion or pneumothorax. The cardiomediastinal silhouette is within normal limits. The visualized bowel gas pattern is unremarkable. Scattered stool and air are seen within the colon; there is no evidence of small bowel dilatation to suggest obstruction. No free intra-abdominal air is identified on the provided upright view. No acute osseous abnormalities are seen; the sacroiliac joints are unremarkable in appearance. IMPRESSION: 1. Unremarkable bowel gas pattern;  no free intra-abdominal air seen. 2. No acute cardiopulmonary process seen. Electronically Signed   By: Garald Balding M.D.   On: 07/23/2016 01:33    Terria Deschepper, DO  [facility] Pager [phone]  If 7PM-7AM, please contact night-coverage [email or web address] Password [email or web address] 07/24/2016, 1:29 PM   LOS: 0 days

## 2016-07-24 NOTE — Progress Notes (Addendum)
Inpatient Diabetes Program Recommendations  AACE/ADA: New Consensus Statement on Inpatient Glycemic Control (2015)  Target Ranges:  Prepandial:   less than 140 mg/dL      Peak postprandial:   less than 180 mg/dL (1-2 hours)      Critically ill patients:  140 - 180 mg/dL  Results for Nancy [name], Nancy [name] (MRN [id number]) as of 07/24/2016 07:47  Ref. Range 07/23/2016 08:14 07/23/2016 12:17 07/23/2016 16:23 07/23/2016 20:44 07/24/2016 06:30  Glucose-Capillary Latest Ref Range: 65 - 99 mg/dL 288 (H) 229 (H) 211 (H) 216 (H) 188 (H)   Results for Nancy [name], Nancy [name] (MRN [id number]) as of 07/24/2016 07:47  Ref. Range 07/23/2016 01:18  Hemoglobin A1C Latest Ref Range: 4.8 - 5.6 % 13.3 (H)   Review of Glycemic Control  Diabetes history: DM2 Outpatient Diabetes medications: 70/30 40 units daily Current orders for Inpatient glycemic control: Lantus 30 units daily, Novolog 0-9 units TID with melas  Inpatient Diabetes Program Recommendations: Insulin - Basal: Please consider increasing Lantus to 32 units daily. Correction (SSI): Please consider ordering Novolog bedtime correction scale. Insulin - Meal Coverage: Please consider ordering Novolog 4 units TID with meals for meal coverage. HgbA1C: A1C 13.3% on 07/23/16 indicating an average glucuose of 335 mg/dl over the past 2-3 months. Patient needs to follow up regarding glycemic control. Recommend MD adjusting outpatient DM regimen at time of discharge to improve glycemic control.  Addendum 07/24/16@15 :07-Attempted to call patient over the phone multiple times today to discuss diabetes control and outpatient regimen. Patient answered phone at 13:20 today and stated MD was in room and asked diabetes coordinator to call back. Have called several other times and patient not answering the phone. Will try to talk with patient again on 07/25/16.  Thanks, Barnie Alderman, RN, MSN, CDE Diabetes Coordinator Inpatient Diabetes Program [phone] (Team Pager from [place] to  [place]) [phone] ([facility] office) [phone] [facility] office) [phone] [facility] office)

## 2016-07-25 DIAGNOSIS — Z794 Long term (current) use of insulin: Secondary | ICD-10-CM

## 2016-07-25 DIAGNOSIS — E1140 Type 2 diabetes mellitus with diabetic neuropathy, unspecified: Secondary | ICD-10-CM

## 2016-07-25 DIAGNOSIS — E1143 Type 2 diabetes mellitus with diabetic autonomic (poly)neuropathy: Principal | ICD-10-CM

## 2016-07-25 DIAGNOSIS — K3184 Gastroparesis: Secondary | ICD-10-CM

## 2016-07-25 DIAGNOSIS — E1165 Type 2 diabetes mellitus with hyperglycemia: Secondary | ICD-10-CM

## 2016-07-25 DIAGNOSIS — I10 Essential (primary) hypertension: Secondary | ICD-10-CM

## 2016-07-25 LAB — BASIC METABOLIC PANEL
ANION GAP: 13 (ref 5–15)
BUN: 9 mg/dL (ref 6–20)
CHLORIDE: 100 mmol/L — AB (ref 101–111)
CO2: 22 mmol/L (ref 22–32)
Calcium: 9.1 mg/dL (ref 8.9–10.3)
Creatinine, Ser: 0.62 mg/dL (ref 0.44–1.00)
GFR calc non Af Amer: >60 mL/min (ref >60)
Glucose, Bld: 198 mg/dL — ABNORMAL HIGH (ref 65–99)
POTASSIUM: 3.2 mmol/L — AB (ref 3.5–5.1)
SODIUM: 135 mmol/L (ref 135–145)

## 2016-07-25 LAB — CBC
HEMATOCRIT: 44.4 % (ref 36.0–46.0)
HEMOGLOBIN: 15.3 g/dL — AB (ref 12.0–15.0)
MCH: 28.5 pg (ref 26.0–34.0)
MCHC: 34.5 g/dL (ref 30.0–36.0)
MCV: 82.7 fL (ref 78.0–100.0)
Platelets: 293 10*3/uL (ref 150–400)
RBC: 5.37 MIL/uL — AB (ref 3.87–5.11)
RDW: 12.4 % (ref 11.5–15.5)
WBC: 12.8 10*3/uL — ABNORMAL HIGH (ref 4.0–10.5)

## 2016-07-25 LAB — GLUCOSE, CAPILLARY
GLUCOSE-CAPILLARY: 192 mg/dL — AB (ref 65–99)
GLUCOSE-CAPILLARY: 182 mg/dL — AB (ref 65–99)
GLUCOSE-CAPILLARY: 257 mg/dL — AB (ref 65–99)
Glucose-Capillary: 188 mg/dL — ABNORMAL HIGH (ref 65–99)

## 2016-07-25 LAB — MAGNESIUM: MAGNESIUM: 1.8 mg/dL (ref 1.7–2.4)

## 2016-07-25 MED ORDER — AMLODIPINE BESYLATE 10 MG PO TABS
10.0000 mg | ORAL_TABLET | Freq: Every day | ORAL | Status: DC
  Administered 2016-07-25 – 2016-07-26 (×2): 10 mg via ORAL
  Filled 2016-07-25 (×2): qty 1

## 2016-07-25 MED ORDER — HYDROCHLOROTHIAZIDE 25 MG PO TABS
25.0000 mg | ORAL_TABLET | Freq: Every day | ORAL | Status: DC
  Administered 2016-07-25 – 2016-07-26 (×2): 25 mg via ORAL
  Filled 2016-07-25 (×2): qty 1

## 2016-07-25 MED ORDER — HYDRALAZINE HCL 50 MG PO TABS
100.0000 mg | ORAL_TABLET | Freq: Three times a day (TID) | ORAL | Status: DC
  Administered 2016-07-25 – 2016-07-26 (×4): 100 mg via ORAL
  Filled 2016-07-25 (×4): qty 2

## 2016-07-25 MED ORDER — POTASSIUM CHLORIDE IN NACL 20-0.9 MEQ/L-% IV SOLN
INTRAVENOUS | Status: DC
  Administered 2016-07-25 (×2): via INTRAVENOUS
  Filled 2016-07-25: qty 1000

## 2016-07-25 MED ORDER — LISINOPRIL 20 MG PO TABS
20.0000 mg | ORAL_TABLET | Freq: Every day | ORAL | Status: DC
  Administered 2016-07-25 – 2016-07-26 (×2): 20 mg via ORAL
  Filled 2016-07-25 (×2): qty 1

## 2016-07-25 MED ORDER — POTASSIUM CHLORIDE CRYS ER 20 MEQ PO TBCR
40.0000 meq | EXTENDED_RELEASE_TABLET | Freq: Once | ORAL | Status: AC
  Administered 2016-07-25: 40 meq via ORAL

## 2016-07-25 NOTE — Progress Notes (Signed)
[facility]  PROGRESS NOTE  Nancy [name] [id number] DOB: 02/29/1961 DOA: 07/23/2016 PCP: Minerva Ends, MD    Brief HPI:  55 year old female with history of diabetes mellitus, hypertension, depression presented with 3 day history of nausea, vomiting, abdominal pain. The patient stated that her symptoms continued to worsen and continued to have intractable vomiting without any blood. She denies any sick contacts or unusual or undercooked foods. Notably, the patient states that she has been off of her antihypertensive medications for the past 6 months, and she has been trying to "stretch out" her Lantus pens for the past 2-3 month but not using her insulin every day. She denied any fevers, chills, chest discomfort, headache, neck pain, rashes, synovitis. Acute abdominal series was unremarkable. BMP and LFTs were unremarkable. The patient was started on clear liquid diet and intravenous fluids.     Assessment/Plan:    1. Intractable nausea vomiting/diabetic gastroparesis- continue Reglan, no vomiting. Will advance diet as tolerated. 2. Atypical chest pain- resolved, troponin negative, EKG showed no ischemic changes. 3. Hypertension- will restart home medications  Including hydralazine, HCTZ, amlodipine, lisinopril. 4. Diabetes mellitus- hemoglobin A1c 13.3, continue Lantus 30 units daily, NovoLog moderate sliding scale. Glucose 192,180,188. 5. Hypokalemia- replace potassium and check BMP in am. 6. Leukocytosis- improved today white count is 12000. Likely stress margination.patient is afebrile.   DVT prophylaxis: Lovenox Code Status: full code Family Communication: no family at bedside Disposition Plan: home in 1-2 days   Consultants:  none  Procedures:  none  Antibiotics:  none  Subjective   Patient seen and examined, denies abdominal pain. Tolerating clear liquid diet, wants to try solid food.  Objective    Objective: Vitals:   07/25/16 0757  07/25/16 1000 07/25/16 1137 07/25/16 1632  BP: (!) 214/104 (!) 157/83 (!) 152/78 (!) 151/92  Pulse: 81  86 (!) 105  Resp: 18 18  16   Temp: 99.1 F (37.3 C)  98.3 F (36.8 C) 98.6 F (37 C)  TempSrc: Oral  Oral Oral  SpO2: 100% 99% 100% 98%  Weight:      Height:        Intake/Output Summary (Last 24 hours) at 07/25/16 1725 Last data filed at 07/25/16 1138  Gross per 24 hour  Intake          1171.67 ml  Output             2500 ml  Net         -1328.33 ml   Filed Weights   07/23/16 0816 07/24/16 0546 07/25/16 0609  Weight: 84 kg (185 lb 1.6 oz) 84.8 kg (187 lb) 81.7 kg (180 lb 1.6 oz)    Examination:  General exam: Appears calm and comfortable  Respiratory system: Clear to auscultation. Respiratory effort normal. Cardiovascular system: S1 & S2 heard, RRR. No JVD, murmurs, rubs, gallops or clicks. No pedal edema. Gastrointestinal system: Abdomen is nondistended, soft and nontender. No organomegaly or masses felt. Normal bowel sounds heard. Central nervous system: Alert and oriented. No focal neurological deficits. Extremities: Symmetric 5 x 5 power. Skin: No rashes, lesions or ulcers Psychiatry: Judgement and insight appear normal. Mood & affect appropriate.    Data Reviewed: I have personally reviewed following labs and imaging studies Basic Metabolic Panel:  Recent Labs Lab 07/21/16 1953 07/23/16 0118 07/24/16 0028 07/25/16 0657  NA 133* 131* 135 135  K 3.8 4.4 3.6 3.2*  CL 95* 99* 99* 100*  CO2 26 23 28 22   GLUCOSE 325*  346* 177* 198*  BUN 15 8 7 9   CREATININE 0.78 0.73 0.71 0.62  CALCIUM 9.3 9.5 9.0 9.1  MG  --   --   --  1.8   Liver Function Tests:  Recent Labs Lab 07/21/16 1953 07/23/16 0118 07/24/16 0028  AST 15 27 13*  ALT 14 15 11*  ALKPHOS 86 82 73  BILITOT 0.9 1.3* 0.7  PROT 8.4* 7.6 7.3  ALBUMIN 4.2 3.9 3.5    Recent Labs Lab 07/21/16 1953 07/23/16 0118  LIPASE 36 33   No results for input(s): AMMONIA in the last 168  hours. CBC:  Recent Labs Lab 07/21/16 2033 07/23/16 0118 07/24/16 0028 07/25/16 0453  WBC 11.7* 10.0 17.0* 12.8*  NEUTROABS  --  7.0  --   --   HGB 15.0 14.9 14.0 15.3*  HCT 43.0 42.4 40.6 44.4  MCV 81.7 81.4 82.7 82.7  PLT 320 300 343 293   Cardiac Enzymes:  Recent Labs Lab 07/23/16 0118 07/23/16 0614 07/23/16 1154 07/23/16 1734 07/24/16 0019  TROPONINI <0.03 <0.03 0.04* <0.03 <0.03    CBG:  Recent Labs Lab 07/24/16 1621 07/24/16 2104 07/25/16 0648 07/25/16 1115 07/25/16 1631  GLUCAP 182* 215* 192* 182* 188*    No results found for this or any previous visit (from the past 240 hour(s)).   Studies: No results found.  Scheduled Meds: . amLODipine  10 mg Oral Daily  . enoxaparin (LOVENOX) injection  40 mg Subcutaneous Q24H  . famotidine (PEPCID) IV  20 mg Intravenous Q12H  . hydrALAZINE  100 mg Oral Q8H  . hydrochlorothiazide  25 mg Oral Daily  . insulin aspart  0-9 Units Subcutaneous TID WC  . insulin glargine  30 Units Subcutaneous Daily  . lisinopril  20 mg Oral Daily  . metoCLOPramide (REGLAN) injection  10 mg Intravenous Q6H  . sodium chloride flush  3 mL Intravenous Q12H   Continuous Infusions: . 0.9 % NaCl with KCl 20 mEq / L 100 mL/hr at 07/25/16 0415       Time spent: 25 min    [facility] Pager [phone]. If 7PM-7AM, please contact night-coverage at [email or web address], Office  [phone]  password [email or web address] 07/25/2016, 5:25 PM  LOS: 1 day

## 2016-07-25 NOTE — Progress Notes (Signed)
Pt said  When asked . No bm  Since 9 days ago . Dulcolax suppository giben rectally

## 2016-07-26 DIAGNOSIS — R739 Hyperglycemia, unspecified: Secondary | ICD-10-CM

## 2016-07-26 DIAGNOSIS — G43A1 Cyclical vomiting, intractable: Secondary | ICD-10-CM

## 2016-07-26 LAB — BASIC METABOLIC PANEL
Anion gap: 10 (ref 5–15)
Anion gap: 11 (ref 5–15)
BUN: 16 mg/dL (ref 6–20)
BUN: 16 mg/dL (ref 6–20)
CALCIUM: 8.7 mg/dL — AB (ref 8.9–10.3)
CALCIUM: 9 mg/dL (ref 8.9–10.3)
CHLORIDE: 100 mmol/L — AB (ref 101–111)
CO2: 25 mmol/L (ref 22–32)
CO2: 23 mmol/L (ref 22–32)
CREATININE: 1.05 mg/dL — AB (ref 0.44–1.00)
CREATININE: 0.8 mg/dL (ref 0.44–1.00)
Chloride: 103 mmol/L (ref 101–111)
GFR calc non Af Amer: 58 mL/min — ABNORMAL LOW (ref >60)
GFR calc non Af Amer: >60 mL/min (ref >60)
Glucose, Bld: 149 mg/dL — ABNORMAL HIGH (ref 65–99)
Glucose, Bld: 183 mg/dL — ABNORMAL HIGH (ref 65–99)
Potassium: 3.1 mmol/L — ABNORMAL LOW (ref 3.5–5.1)
Potassium: 3.5 mmol/L (ref 3.5–5.1)
SODIUM: 138 mmol/L (ref 135–145)
Sodium: 134 mmol/L — ABNORMAL LOW (ref 135–145)

## 2016-07-26 LAB — GLUCOSE, CAPILLARY
GLUCOSE-CAPILLARY: 138 mg/dL — AB (ref 65–99)
GLUCOSE-CAPILLARY: 176 mg/dL — AB (ref 65–99)

## 2016-07-26 LAB — MAGNESIUM: Magnesium: 1.8 mg/dL (ref 1.7–2.4)

## 2016-07-26 MED ORDER — FAMOTIDINE 20 MG PO TABS: 20.0000 mg | ORAL_TABLET | Freq: Two times a day (BID) | ORAL | Status: DC

## 2016-07-26 MED ORDER — FAMOTIDINE 20 MG PO TABS: 20.0000 mg | ORAL_TABLET | Freq: Two times a day (BID) | ORAL | 0 refills | Status: DC

## 2016-07-26 MED ORDER — POTASSIUM CHLORIDE CRYS ER 20 MEQ PO TBCR
40.0000 meq | EXTENDED_RELEASE_TABLET | Freq: Once | ORAL | Status: AC
  Administered 2016-07-26: 40 meq via ORAL
  Filled 2016-07-26: qty 2

## 2016-07-26 MED ORDER — CYCLOBENZAPRINE HCL 10 MG PO TABS: 10.0000 mg | ORAL_TABLET | Freq: Three times a day (TID) | ORAL | 0 refills | Status: DC | PRN

## 2016-07-26 MED ORDER — LISINOPRIL 20 MG PO TABS: 20.0000 mg | ORAL_TABLET | Freq: Every day | ORAL | 2 refills | Status: DC

## 2016-07-26 MED ORDER — AMLODIPINE BESYLATE 10 MG PO TABS: 10.0000 mg | ORAL_TABLET | Freq: Every day | ORAL | 2 refills | Status: DC

## 2016-07-26 MED ORDER — HYDROMORPHONE HCL 1 MG/ML IJ SOLN
1.0000 mg | Freq: Once | INTRAMUSCULAR | Status: AC
Start: 2016-07-26 — End: 2016-07-26
  Administered 2016-07-26: 1 mg via INTRAVENOUS
  Filled 2016-07-26: qty 1

## 2016-07-26 MED ORDER — POTASSIUM CHLORIDE CRYS ER 20 MEQ PO TBCR
40.0000 meq | EXTENDED_RELEASE_TABLET | ORAL | Status: DC
  Administered 2016-07-26: 40 meq via ORAL
  Filled 2016-07-26: qty 2

## 2016-07-26 MED ORDER — POTASSIUM CHLORIDE 10 MEQ/100ML IV SOLN
10.0000 meq | INTRAVENOUS | Status: AC
  Administered 2016-07-26 (×2): 10 meq via INTRAVENOUS
  Filled 2016-07-26 (×2): qty 100

## 2016-07-26 MED ORDER — METOCLOPRAMIDE HCL 10 MG PO TABS
10.0000 mg | ORAL_TABLET | Freq: Four times a day (QID) | ORAL | 0 refills | Status: DC | PRN
Start: 2016-07-26 — End: 2017-12-26

## 2016-07-26 MED ORDER — ONDANSETRON HCL 4 MG PO TABS: 4.0000 mg | ORAL_TABLET | Freq: Three times a day (TID) | ORAL | 0 refills | Status: DC | PRN

## 2016-07-26 MED ORDER — HYDROCHLOROTHIAZIDE 25 MG PO TABS: 25.0000 mg | ORAL_TABLET | Freq: Every day | ORAL | 2 refills | Status: DC

## 2016-07-26 MED ORDER — HYDRALAZINE HCL 100 MG PO TABS: 100.0000 mg | ORAL_TABLET | Freq: Three times a day (TID) | ORAL | 2 refills | Status: DC

## 2016-07-26 MED ORDER — INSULIN ASPART PROT & ASPART (70-30 MIX) 100 UNIT/ML ~~LOC~~ SUSP: 40.0000 [IU] | Freq: Two times a day (BID) | SUBCUTANEOUS | 3 refills | Status: DC

## 2016-07-26 NOTE — Progress Notes (Signed)
Potassium 3.1 Potassium given iv/po as ordered . BMt drawn at 1345 by lab DR [place] aware For possible discharge today

## 2016-07-26 NOTE — Progress Notes (Signed)
Pt c/o back pain, Toradol given, tol well, pt slept well during the night.

## 2016-07-26 NOTE — Progress Notes (Signed)
[facility]  PROGRESS NOTE  Nancy [name] [id number] DOB: 1961/03/12 DOA: 07/23/2016 PCP: Minerva Ends, MD    Brief HPI:  55 year old female with history of diabetes mellitus, hypertension, depression presented with 3 day history of nausea, vomiting, abdominal pain. The patient stated that her symptoms continued to worsen and continued to have intractable vomiting without any blood. She denies any sick contacts or unusual or undercooked foods. Notably, the patient states that she has been off of her antihypertensive medications for the past 6 months, and she has been trying to "stretch out" her Lantus pens for the past 2-3 month but not using her insulin every day. She denied any fevers, chills, chest discomfort, headache, neck pain, rashes, synovitis. Acute abdominal series was unremarkable. BMP and LFTs were unremarkable. The patient was started on clear liquid diet and intravenous fluids.     Assessment/Plan:    1. Intractable nausea vomiting/diabetic gastroparesis- continue Reglan, no vomiting. Will advance diet as tolerated. 2. Atypical chest pain- resolved, troponin negative, EKG showed no ischemic changes. 3. Hypertension- BP improved after starting  home medications  Including hydralazine, HCTZ, amlodipine, lisinopril. 4. Diabetes mellitus- hemoglobin A1c 13.3, continue Lantus 30 units daily, NovoLog moderate sliding scale.  5. Hypokalemia- replace potassium and check BMP today 6. Leukocytosis- improved today white count is 12000. Likely stress margination.patient is afebrile.   DVT prophylaxis: Lovenox Code Status: full code Family Communication: no family at bedside Disposition Plan: home in 1-2 days   Consultants:  none  Procedures:  none  Antibiotics:  none  Subjective   Patient seen and examined, denies abdominal pain. Started on regular diet this morning, serum potassium is 3.1 today.  Objective    Objective: Vitals:   07/25/16 2039  07/26/16 0004 07/26/16 0545 07/26/16 1000  BP: (!) 142/78 (!) 103/59 108/71 (!) 163/87  Pulse: (!) 113 91 87   Resp: 18 16 18    Temp: 98.6 F (37 C) 97.7 F (36.5 C) 97.8 F (36.6 C)   TempSrc: Oral Oral Oral   SpO2: 96% 97% 99%   Weight:   82 kg (180 lb 12.8 oz)   Height:        Intake/Output Summary (Last 24 hours) at 07/26/16 1049 Last data filed at 07/26/16 0855  Gross per 24 hour  Intake          1428.33 ml  Output              700 ml  Net           728.33 ml   Filed Weights   07/24/16 0546 07/25/16 0609 07/26/16 0545  Weight: 84.8 kg (187 lb) 81.7 kg (180 lb 1.6 oz) 82 kg (180 lb 12.8 oz)    Examination:  General exam: Appears calm and comfortable  Respiratory system: Clear to auscultation. Respiratory effort normal. Cardiovascular system: S1 & S2 heard, RRR. No JVD, murmurs, rubs, gallops or clicks. No pedal edema. Gastrointestinal system: Abdomen is nondistended, soft and nontender. No organomegaly or masses felt. Normal bowel sounds heard. Central nervous system: Alert and oriented. No focal neurological deficits. Extremities: Symmetric 5 x 5 power. Skin: No rashes, lesions or ulcers Psychiatry: Judgement and insight appear normal. Mood & affect appropriate.    Data Reviewed: I have personally reviewed following labs and imaging studies Basic Metabolic Panel:  Recent Labs Lab 07/21/16 1953 07/23/16 0118 07/24/16 0028 07/25/16 0657 07/26/16 0320  NA 133* 131* 135 135 138  K 3.8 4.4 3.6 3.2* 3.1*  CL 95* 99* 99* 100* 103  CO2 26 23 28 22 25   GLUCOSE 325* 346* 177* 198* 149*  BUN 15 8 7 9 16   CREATININE 0.78 0.73 0.71 0.62 1.05*  CALCIUM 9.3 9.5 9.0 9.1 8.7*  MG  --   --   --  1.8  --    Liver Function Tests:  Recent Labs Lab 07/21/16 1953 07/23/16 0118 07/24/16 0028  AST 15 27 13*  ALT 14 15 11*  ALKPHOS 86 82 73  BILITOT 0.9 1.3* 0.7  PROT 8.4* 7.6 7.3  ALBUMIN 4.2 3.9 3.5    Recent Labs Lab 07/21/16 1953 07/23/16 0118  LIPASE 36 33    No results for input(s): AMMONIA in the last 168 hours. CBC:  Recent Labs Lab 07/21/16 2033 07/23/16 0118 07/24/16 0028 07/25/16 0453  WBC 11.7* 10.0 17.0* 12.8*  NEUTROABS  --  7.0  --   --   HGB 15.0 14.9 14.0 15.3*  HCT 43.0 42.4 40.6 44.4  MCV 81.7 81.4 82.7 82.7  PLT 320 300 343 293   Cardiac Enzymes:  Recent Labs Lab 07/23/16 0118 07/23/16 0614 07/23/16 1154 07/23/16 1734 07/24/16 0019  TROPONINI <0.03 <0.03 0.04* <0.03 <0.03    CBG:  Recent Labs Lab 07/25/16 0648 07/25/16 1115 07/25/16 1631 07/25/16 2058 07/26/16 0634  GLUCAP 192* 182* 188* 257* 138*    No results found for this or any previous visit (from the past 240 hour(s)).   Studies: No results found.  Scheduled Meds: . amLODipine  10 mg Oral Daily  . enoxaparin (LOVENOX) injection  40 mg Subcutaneous Q24H  . famotidine (PEPCID) IV  20 mg Intravenous Q12H  . hydrALAZINE  100 mg Oral Q8H  . hydrochlorothiazide  25 mg Oral Daily  . insulin aspart  0-9 Units Subcutaneous TID WC  . insulin glargine  30 Units Subcutaneous Daily  . lisinopril  20 mg Oral Daily  . metoCLOPramide (REGLAN) injection  10 mg Intravenous Q6H  . potassium chloride  10 mEq Intravenous Q1 Hr x 2  . potassium chloride  40 mEq Oral Q4H  . sodium chloride flush  3 mL Intravenous Q12H   Continuous Infusions:       Time spent: 25 min    [facility] Pager [phone]. If 7PM-7AM, please contact night-coverage at [email or web address], Office  [phone]  password [email or web address] 07/26/2016, 10:49 AM  LOS: 2 days

## 2016-07-26 NOTE — Progress Notes (Addendum)
Inpatient Diabetes Program Recommendations  AACE/ADA: New Consensus Statement on Inpatient Glycemic Control (2015)  Target Ranges:  Prepandial:   less than 140 mg/dL      Peak postprandial:   less than 180 mg/dL (1-2 hours)      Critically ill patients:  140 - 180 mg/dL  Results for Nancy [name], Nancy [name] (MRN [id number]) as of 07/26/2016 09:54  Ref. Range 07/25/2016 06:48 07/25/2016 11:15 07/25/2016 16:31 07/25/2016 20:58 07/26/2016 06:34  Glucose-Capillary Latest Ref Range: 65 - 99 mg/dL 192 (H) 182 (H) 188 (H) 257 (H) 138 (H)    Review of Glycemic Control  Current orders for Inpatient glycemic control: Lantus 30 units daily, Novolog 0-9 units TID with meals  Inpatient Diabetes Program Recommendations: Correction (SSI): Please consider ordeirng Novolog bedtime correction scale. Insulin - Meal Coverage: Please consider ordering Novolog 3 units TID with meals if patient eats at least 50% of meals. HgbA1C: A1C 13.3% on 07/23/16 indicating an average glucuose of 335 mg/dl over the past 2-3 months. Patient needs to follow up regarding glycemic control.  Addendum 07/26/16@9 :58-Called patient over the phone to discuss diabetes, A1C, and outpatient insulin regimen. After introducing myself as RN with the Inpatient Diabetes Team, patient said "I don't want to talk now. You can call later." Attempted to tell patient I would only take a few minutes of her time, but patient hung up the phone. Had noted in the chart that patient is trying to stretch out her Lantus pens for 2-3 months. Was going to let patient know about [employer] which has a $0 co-pay for Lantus if patient has insurance that is NOT federally funded. Will have diabetes coordinator that is on [facility] to take saving card by to patient.  Thanks, Barnie Alderman, RN, MSN, CDE Diabetes Coordinator Inpatient Diabetes Program [phone] (Team Pager from [place] to [place]) [phone] ([place] office) [phone] [facility] office) [phone] [facility]  office)

## 2016-07-26 NOTE — Discharge Summary (Addendum)
Physician Discharge Summary  Nancy [name] [id number] DOB: [birth year] DOA: 07/23/2016  PCP: Minerva Ends, MD  Admit date: 07/23/2016 Discharge date: 07/26/2016  Time spent: 25* minutes  Recommendations for Outpatient Follow-up:  1. Follow up PCP in one week   Discharge Diagnoses:  Active Problems:   Type II diabetes mellitus with neurological manifestations, uncontrolled ([place])   Uncontrolled hypertension   Gastroparesis due to DM (HCC)   Elevated bilirubin   Hyperglycemia   Emesis   Discharge Condition: Stable  Diet recommendation: Low salt/diabetic diet  Filed Weights   07/24/16 0546 07/25/16 0609 07/26/16 0545  Weight: 84.8 kg (187 lb) 81.7 kg (180 lb 1.6 oz) 82 kg (180 lb 12.8 oz)    History of present illness:  55 year old female with history of diabetes mellitus, hypertension, depression presented with 3 day history of nausea, vomiting, abdominal pain. The patient stated that her symptoms continued to worsen and continued to have intractable vomiting without any blood. She denies any sick contacts or unusual or undercooked foods. Notably, the patient states that she has been off of her antihypertensive medications for the past 6 months, and she has been trying to "stretch out" her Lantus pens for the past 2-3 month but not using her insulin every day.She denied any fevers, chills, chest discomfort, headache, neck pain, rashes, synovitis. Acute abdominal series was unremarkable. BMP and LFTs were unremarkable. The patient was started on clear liquid diet and intravenous fluids.   Hospital Course:  1. Intractable nausea vomiting/diabetic gastroparesis- improved on  Reglan, no vomiting. Tolerarting regular diet. Will discharge home on Reglan , Zofran prn. 2. Atypical chest pain- resolved, troponin negative, EKG showed no ischemic changes. 3. Hypertension- BP improved after starting  home medications  Including hydralazine, HCTZ, amlodipine, lisinopril. Continue  with these meds as outpatient. 4. Diabetes mellitus- hemoglobin A1c 13.3, continue Novolog mix 70/30 40 units twice a day. 5. Hypokalemia- replaced potassium and repeat K is 3.5 6. Leukocytosis- improved today white count is 12000. Likely stress margination.patient is afebrile.   Procedures:  None   Consultations:  None   Discharge Exam: Vitals:   07/26/16 1000 07/26/16 1212  BP: (!) 163/87 (!) 172/84  Pulse:  (!) 102  Resp:  20  Temp:  98.3 F (36.8 C)    General: Appears in no acute distress Cardiovascular: S1s2  RRR Respiratory: Clear bilaterally  Discharge Instructions   Discharge Instructions    Diet - low sodium heart healthy    Complete by:  As directed   Increase activity slowly    Complete by:  As directed     Current Discharge Medication List    START taking these medications   Details  amLODipine (NORVASC) 10 MG tablet Take 1 tablet (10 mg total) by mouth daily. Qty: 30 tablet, Refills: 2    cyclobenzaprine (FLEXERIL) 10 MG tablet Take 1 tablet (10 mg total) by mouth 3 (three) times daily as needed for muscle spasms. Qty: 30 tablet, Refills: 0    famotidine (PEPCID) 20 MG tablet Take 1 tablet (20 mg total) by mouth 2 (two) times daily. Qty: 30 tablet, Refills: 0    hydrALAZINE (APRESOLINE) 100 MG tablet Take 1 tablet (100 mg total) by mouth every 8 (eight) hours. Qty: 90 tablet, Refills: 2    hydrochlorothiazide (HYDRODIURIL) 25 MG tablet Take 1 tablet (25 mg total) by mouth daily. Qty: 30 tablet, Refills: 2    lisinopril (PRINIVIL,ZESTRIL) 20 MG tablet Take 1 tablet (20 mg total) by mouth  daily. Qty: 30 tablet, Refills: 2    metoCLOPramide (REGLAN) 10 MG tablet Take 1 tablet (10 mg total) by mouth every 6 (six) hours as needed for nausea. Qty: 30 tablet, Refills: 0    ondansetron (ZOFRAN) 4 MG tablet Take 1 tablet (4 mg total) by mouth every 8 (eight) hours as needed for nausea or vomiting. Qty: 20 tablet, Refills: 0      CONTINUE these  medications which have CHANGED   Details  insulin aspart protamine- aspart (NOVOLOG MIX 70/30) (70-30) 100 UNIT/ML injection Inject 0.4 mLs (40 Units total) into the skin 2 (two) times daily with a meal. Qty: 21 mL, Refills: 3   Associated Diagnoses: Uncontrolled secondary diabetes mellitus with neurological manifestations ([place]); Type II diabetes mellitus with neurological manifestations, uncontrolled ([place])      CONTINUE these medications which have NOT CHANGED   Details  acetaminophen (TYLENOL) 500 MG tablet Take 1,000 mg by mouth every 6 (six) hours as needed for mild pain.       Allergies  Allergen Reactions  . Hydrocodone Other (See Comments)    Upset stomach  . Metformin And Related Nausea And Vomiting and Other (See Comments)    Stomach pains   . Sulfur Hives   Follow-up Information    Minerva Ends, MD. Schedule an appointment as soon as possible for a visit in 1 week(s).   Specialty:  Family Medicine Contact information: [address] [place] [address] [phone]            The results of significant diagnostics from this hospitalization (including imaging, microbiology, ancillary and laboratory) are listed below for reference.    Significant Diagnostic Studies: Dg Abd Acute W/chest  Result Date: 07/23/2016 CLINICAL DATA:  Acute onset of generalized chest and abdominal pain. Vomiting. Initial encounter. EXAM: DG ABDOMEN ACUTE W/ 1V CHEST COMPARISON:  CT of the abdomen and pelvis performed 08/24/2014, and chest radiograph performed 08/22/2014 FINDINGS: The lungs are well-aerated and clear. There is no evidence of focal opacification, pleural effusion or pneumothorax. The cardiomediastinal silhouette is within normal limits. The visualized bowel gas pattern is unremarkable. Scattered stool and air are seen within the colon; there is no evidence of small bowel dilatation to suggest obstruction. No free intra-abdominal air is identified on the provided  upright view. No acute osseous abnormalities are seen; the sacroiliac joints are unremarkable in appearance. IMPRESSION: 1. Unremarkable bowel gas pattern; no free intra-abdominal air seen. 2. No acute cardiopulmonary process seen. Electronically Signed   By: Garald Balding M.D.   On: 07/23/2016 01:33    Microbiology: No results found for this or any previous visit (from the past 240 hour(s)).   Labs: Basic Metabolic Panel:  Recent Labs Lab 07/23/16 0118 07/24/16 0028 07/25/16 0657 07/26/16 0320 07/26/16 1349  NA 131* 135 135 138 134*  K 4.4 3.6 3.2* 3.1* 3.5  CL 99* 99* 100* 103 100*  CO2 23 28 22 25 23   GLUCOSE 346* 177* 198* 149* 183*  BUN 8 7 9 16 16   CREATININE 0.73 0.71 0.62 1.05* 0.80  CALCIUM 9.5 9.0 9.1 8.7* 9.0  MG  --   --  1.8  --  1.8   Liver Function Tests:  Recent Labs Lab 07/21/16 1953 07/23/16 0118 07/24/16 0028  AST 15 27 13*  ALT 14 15 11*  ALKPHOS 86 82 73  BILITOT 0.9 1.3* 0.7  PROT 8.4* 7.6 7.3  ALBUMIN 4.2 3.9 3.5    Recent Labs Lab 07/21/16  1953 07/23/16 0118  LIPASE 36 33   No results for input(s): AMMONIA in the last 168 hours. CBC:  Recent Labs Lab 07/21/16 2033 07/23/16 0118 07/24/16 0028 07/25/16 0453  WBC 11.7* 10.0 17.0* 12.8*  NEUTROABS  --  7.0  --   --   HGB 15.0 14.9 14.0 15.3*  HCT 43.0 42.4 40.6 44.4  MCV 81.7 81.4 82.7 82.7  PLT 320 300 343 293   Cardiac Enzymes:  Recent Labs Lab 07/23/16 0118 07/23/16 0614 07/23/16 1154 07/23/16 1734 07/24/16 0019  TROPONINI <0.03 <0.03 0.04* <0.03 <0.03   BNP: BNP (last 3 results) No results for input(s): BNP in the last 8760 hours.  ProBNP (last 3 results) No results for input(s): PROBNP in the last 8760 hours.  CBG:  Recent Labs Lab 07/25/16 1115 07/25/16 1631 07/25/16 2058 07/26/16 0634 07/26/16 1138  GLUCAP 182* 188* 257* 138* 176*       Signed:  Eleonore Chiquito S MD.  [facility] 07/26/2016, 2:53 PM

## 2016-07-27 LAB — GLUCOSE, CAPILLARY: Glucose-Capillary: 162 mg/dL — ABNORMAL HIGH (ref 65–99)

## 2016-08-17 ENCOUNTER — Encounter: Payer: Self-pay | Admitting: Family Medicine

## 2016-08-17 ENCOUNTER — Ambulatory Visit: Payer: Managed Care, Other (non HMO) | Attending: Family Medicine | Admitting: Family Medicine

## 2016-08-17 VITALS — BP 116/76 | HR 93 | Temp 98.8°F | Wt 186.6 lb

## 2016-08-17 DIAGNOSIS — B353 Tinea pedis: Secondary | ICD-10-CM | POA: Diagnosis not present

## 2016-08-17 DIAGNOSIS — E1140 Type 2 diabetes mellitus with diabetic neuropathy, unspecified: Secondary | ICD-10-CM

## 2016-08-17 DIAGNOSIS — H3530 Unspecified macular degeneration: Secondary | ICD-10-CM | POA: Diagnosis not present

## 2016-08-17 DIAGNOSIS — E1165 Type 2 diabetes mellitus with hyperglycemia: Secondary | ICD-10-CM | POA: Diagnosis not present

## 2016-08-17 DIAGNOSIS — I10 Essential (primary) hypertension: Secondary | ICD-10-CM

## 2016-08-17 DIAGNOSIS — Z794 Long term (current) use of insulin: Secondary | ICD-10-CM | POA: Diagnosis not present

## 2016-08-17 DIAGNOSIS — IMO0002 Reserved for concepts with insufficient information to code with codable children: Secondary | ICD-10-CM

## 2016-08-17 DIAGNOSIS — Z0000 Encounter for general adult medical examination without abnormal findings: Secondary | ICD-10-CM | POA: Diagnosis not present

## 2016-08-17 DIAGNOSIS — K3184 Gastroparesis: Secondary | ICD-10-CM | POA: Diagnosis not present

## 2016-08-17 DIAGNOSIS — Z1231 Encounter for screening mammogram for malignant neoplasm of breast: Secondary | ICD-10-CM

## 2016-08-17 DIAGNOSIS — E1143 Type 2 diabetes mellitus with diabetic autonomic (poly)neuropathy: Secondary | ICD-10-CM

## 2016-08-17 LAB — GLUCOSE, POCT (MANUAL RESULT ENTRY): POC GLUCOSE: 297 mg/dL — AB (ref 70–99)

## 2016-08-17 MED ORDER — KETOCONAZOLE 2 % EX CREA: 1.0000 "application " | TOPICAL_CREAM | Freq: Every day | CUTANEOUS | 0 refills | Status: DC

## 2016-08-17 MED ORDER — INSULIN ASPART 100 UNIT/ML FLEXPEN: 10.0000 [IU] | PEN_INJECTOR | Freq: Three times a day (TID) | SUBCUTANEOUS | 11 refills | Status: DC

## 2016-08-17 MED ORDER — INSULIN GLARGINE 100 UNIT/ML SOLOSTAR PEN: 44.0000 [IU] | PEN_INJECTOR | Freq: Every day | SUBCUTANEOUS | 5 refills | Status: DC

## 2016-08-17 MED ORDER — ATORVASTATIN CALCIUM 40 MG PO TABS: 40.0000 mg | ORAL_TABLET | Freq: Every day | ORAL | 3 refills | Status: DC

## 2016-08-17 MED ORDER — POLYETHYLENE GLYCOL 3350 17 GM/SCOOP PO POWD: 17.0000 g | Freq: Every day | ORAL | 1 refills | Status: DC

## 2016-08-17 MED FILL — ?ATORVASTATIN 40MG TABLET: 40 | 30 days supply | Qty: 30 | Fill #0

## 2016-08-17 MED FILL — POLYETHYLENE GLYCOL 3350 PO: 30 days supply | Qty: 510 | Fill #0

## 2016-08-17 MED FILL — !LANTUS SOLOSTAR 100UNITS/M: 100 | 27 days supply | Qty: 12 | Fill #0 | Status: TO

## 2016-08-17 MED FILL — !NOVOLOG FLEXPEN SYRINGE 1: 100/ML | 30 days supply | Qty: 9 | Fill #0 | Status: TO

## 2016-08-17 MED FILL — KETOCONAZOLE 2% CREAM: 2 | 7 days supply | Qty: 15 | Fill #0

## 2016-08-17 NOTE — Patient Instructions (Addendum)
Miasia was seen today for diabetes.  Diagnoses and all orders for this visit:  Uncontrolled type 2 diabetes mellitus with diabetic neuropathy, with long-term current use of insulin (HCC) -     Glucose (CBG) -     atorvastatin (LIPITOR) 40 MG tablet; Take 1 tablet (40 mg total) by mouth daily. -     insulin aspart (NOVOLOG FLEXPEN) 100 UNIT/ML FlexPen; Inject 10 Units into the skin 3 (three) times daily with meals. -     Insulin Glargine (LANTUS SOLOSTAR) 100 UNIT/ML Solostar Pen; Inject 44 Units into the skin daily.  Macular degeneration  Diabetic gastroparesis associated with type 2 diabetes mellitus ([place]) -     Ambulatory referral to Gastroenterology -     polyethylene glycol powder (GLYCOLAX/MIRALAX) powder; Take 17 g by mouth daily.  Visit for screening mammogram -     MM DIGITAL SCREENING BILATERAL; Future  Healthcare maintenance -     Ambulatory referral to Gastroenterology  Tinea pedis of both feet -     ketoconazole (NIZORAL) 2 % cream; Apply 1 application topically daily. To rash between toes  I must recommend that you not drive until you have your macular degeneration treated uber or lyft are good options for transportation. Please call you insurance company and retina specialist today to schedule treatment.   Diabetes blood sugar goals  Fasting (in AM before breakfast, 8 hrs of no eating or drinking (except water or unsweetened coffee or tea): 90-110 2 hrs after meals: < 160,   No low sugars: nothing < 70    F/u in 4 weeks for diabetes, CBG review and insulin adjustment  Dr. Adrian Blackwater

## 2016-08-17 NOTE — Progress Notes (Signed)
Subjective:  Patient ID: Nancy [name], female    DOB: 05-Nov-1961  Age: 55 y.o. MRN: [id number]  CC: Diabetes   HPI Nancy [name] presents for    1. CHRONIC DIABETES  Disease Monitoring  Blood Sugar Ranges:   Fasting: 187-297  Post prandial: 291     Polyuria: yes   Visual problems: yes   Medication Compliance: yes, lantus 44 U once daily, taking novolog 70/30 44 U BID  Medication Side Effects  Hypoglycemia: no   Preventitive Health Care  Eye Exam: due   Foot Exam: done today   Diet pattern: eats 2 meals a day, lunch and dinner, coffee in AM,  no snacks   Exercise: no   2. HTN: compliant with BP medication. No cough, CP, SOB, HA or swelling.   3. Macular degeneration: she reports that she was evaluated by a retina specialist and was advised to have surgery. She has not scheduled surgery. She reports that her peripheral visions is poor. She denies eye pain.   Past Medical History:  Diagnosis Date  . Anemia 2006  . Depression 2014   previously on amitryptiline   . Diabetes mellitus with neurological manifestation ([place]) 2006  . Fracture of left ankle 1997   . Gastroparesis 07/2016  . HOH (hard of hearing) 2004   . Hyperlipidemia 2006  . Hypertension 2006  . IBS (irritable bowel syndrome) 2002   . Leukopenia 2015   . Shingles 2009   . Thyroid nodule 2004    Social History  Substance Use Topics  . Smoking status: Former Smoker    Packs/day: 0.25    Years: 0.50    Types: Cigarettes  . Smokeless tobacco: Never Used  . Alcohol use Yes     Comment: occasion     Outpatient Medications Prior to Visit  Medication Sig Dispense Refill  . acetaminophen (TYLENOL) 500 MG tablet Take 1,000 mg by mouth every 6 (six) hours as needed for mild pain.    Marland Kitchen amLODipine (NORVASC) 10 MG tablet Take 1 tablet (10 mg total) by mouth daily. 30 tablet 2  . cyclobenzaprine (FLEXERIL) 10 MG tablet Take 1 tablet (10 mg total) by mouth 3 (three) times daily as needed for  muscle spasms. 30 tablet 0  . famotidine (PEPCID) 20 MG tablet Take 1 tablet (20 mg total) by mouth 2 (two) times daily. 30 tablet 0  . hydrALAZINE (APRESOLINE) 100 MG tablet Take 1 tablet (100 mg total) by mouth every 8 (eight) hours. 90 tablet 2  . hydrochlorothiazide (HYDRODIURIL) 25 MG tablet Take 1 tablet (25 mg total) by mouth daily. 30 tablet 2  . insulin aspart protamine- aspart (NOVOLOG MIX 70/30) (70-30) 100 UNIT/ML injection Inject 0.4 mLs (40 Units total) into the skin 2 (two) times daily with a meal. 21 mL 3  . lisinopril (PRINIVIL,ZESTRIL) 20 MG tablet Take 1 tablet (20 mg total) by mouth daily. 30 tablet 2  . metoCLOPramide (REGLAN) 10 MG tablet Take 1 tablet (10 mg total) by mouth every 6 (six) hours as needed for nausea. 30 tablet 0  . ondansetron (ZOFRAN) 4 MG tablet Take 1 tablet (4 mg total) by mouth every 8 (eight) hours as needed for nausea or vomiting. 20 tablet 0   No facility-administered medications prior to visit.     ROS Review of Systems  Constitutional: Positive for fatigue. Negative for chills, fever and unexpected weight change.  Eyes: Positive for visual disturbance.  Respiratory: Negative for shortness of breath.  Cardiovascular: Negative for chest pain.  Gastrointestinal: Positive for constipation and nausea. Negative for abdominal pain and blood in stool.  Endocrine: Positive for polyuria. Negative for polydipsia.  Musculoskeletal: Negative for arthralgias and back pain.  Skin: Negative for rash.  Allergic/Immunologic: Negative for immunocompromised state.  Hematological: Negative for adenopathy. Does not bruise/bleed easily.  Psychiatric/Behavioral: Negative for dysphoric mood and suicidal ideas. The patient is nervous/anxious (financial stressors ).     Objective:  BP 116/76 (BP Location: Left Arm, Patient Position: Sitting, Cuff Size: Small)   Pulse 93   Temp 98.8 F (37.1 C) (Oral)   Wt 186 lb 9.6 oz (84.6 kg)   SpO2 99%   BMI 33.05 kg/m    BP/Weight 08/17/2016 [id number]  Systolic BP 99991111 [id number]  Diastolic BP 76 84 96  Wt. (Lbs) 186.6 180.8 -  BMI 33.05 32.03 -   Wt Readings from Last 3 Encounters:  08/17/16 186 lb 9.6 oz (84.6 kg)  07/26/16 180 lb 12.8 oz (82 kg)  09/21/14 190 lb (86.2 kg)    Physical Exam  Constitutional: She is oriented to person, place, and time. She appears well-developed and well-nourished. No distress.  HENT:  Head: Normocephalic and atraumatic.  Eyes: Conjunctivae and EOM are normal. Pupils are equal, round, and reactive to light.  Cardiovascular: Normal rate, regular rhythm, normal heart sounds and intact distal pulses.   Pulmonary/Chest: Effort normal and breath sounds normal.  Abdominal: Soft. Bowel sounds are normal. She exhibits no distension. There is no tenderness. There is no rebound and no guarding.  Musculoskeletal: She exhibits no edema.  Neurological: She is alert and oriented to person, place, and time.  Skin: Skin is warm and dry. No rash noted.     Psychiatric: She has a normal mood and affect.    Lab Results  Component Value Date   HGBA1C 13.3 (H) 07/23/2016   CBG 297 Assessment & Plan:  [name] was seen today for diabetes.  Diagnoses and all orders for this visit:  Uncontrolled type 2 diabetes mellitus with diabetic neuropathy, with long-term current use of insulin (HCC) -     Glucose (CBG) -     atorvastatin (LIPITOR) 40 MG tablet; Take 1 tablet (40 mg total) by mouth daily. -     insulin aspart (NOVOLOG FLEXPEN) 100 UNIT/ML FlexPen; Inject 10 Units into the skin 3 (three) times daily with meals. -     Insulin Glargine (LANTUS SOLOSTAR) 100 UNIT/ML Solostar Pen; Inject 44 Units into the skin daily.  Macular degeneration  Diabetic gastroparesis associated with type 2 diabetes mellitus ([place]) -     Ambulatory referral to Gastroenterology -     polyethylene glycol powder (GLYCOLAX/MIRALAX) powder; Take 17 g by mouth daily.  Visit for screening  mammogram -     MM DIGITAL SCREENING BILATERAL; Future  Healthcare maintenance -     Ambulatory referral to Gastroenterology -     Flu Vaccine QUAD 36+ mos IM  Tinea pedis of both feet -     ketoconazole (NIZORAL) 2 % cream; Apply 1 application topically daily. To rash between toes  Essential hypertension   There are no diagnoses linked to this encounter.  No orders of the defined types were placed in this encounter.   Follow-up: Return in about 4 weeks (around 09/14/2016) for diabetes .   Boykin Nearing MD

## 2016-08-20 ENCOUNTER — Encounter: Payer: Self-pay | Admitting: Family Medicine

## 2016-08-20 DIAGNOSIS — B353 Tinea pedis: Secondary | ICD-10-CM | POA: Insufficient documentation

## 2016-08-20 NOTE — Assessment & Plan Note (Addendum)
A: diabetes remains uncontrolled. She has chronic nausea due to suspected gastroparesis P: Continue lantus 44 U daily  Add novololg 10 U TID with meals STOP novolog 70/30

## 2016-08-20 NOTE — Assessment & Plan Note (Signed)
A: constipation and chronic nausea P:  miralax and Zofran for symptomatic relief Tighter glycemic control GI referral for screening c-scope

## 2016-08-20 NOTE — Assessment & Plan Note (Signed)
Advised patient to schedule recommended eye surgery Advised that she not drive due to safety concerns

## 2016-08-20 NOTE — Assessment & Plan Note (Signed)
Well controlled on current regimen Continue current treatment

## 2016-09-14 ENCOUNTER — Encounter: Payer: Self-pay | Admitting: Family Medicine

## 2016-09-14 ENCOUNTER — Ambulatory Visit: Payer: Managed Care, Other (non HMO) | Attending: Family Medicine | Admitting: Family Medicine

## 2016-09-14 VITALS — BP 120/83 | HR 88 | Temp 98.6°F | Ht 63.0 in | Wt 194.6 lb

## 2016-09-14 DIAGNOSIS — Z794 Long term (current) use of insulin: Secondary | ICD-10-CM

## 2016-09-14 DIAGNOSIS — IMO0002 Reserved for concepts with insufficient information to code with codable children: Secondary | ICD-10-CM

## 2016-09-14 DIAGNOSIS — Z79899 Other long term (current) drug therapy: Secondary | ICD-10-CM | POA: Diagnosis not present

## 2016-09-14 DIAGNOSIS — E1140 Type 2 diabetes mellitus with diabetic neuropathy, unspecified: Secondary | ICD-10-CM | POA: Diagnosis not present

## 2016-09-14 DIAGNOSIS — M7989 Other specified soft tissue disorders: Secondary | ICD-10-CM | POA: Diagnosis not present

## 2016-09-14 DIAGNOSIS — M791 Myalgia, unspecified site: Secondary | ICD-10-CM | POA: Insufficient documentation

## 2016-09-14 DIAGNOSIS — K589 Irritable bowel syndrome without diarrhea: Secondary | ICD-10-CM | POA: Insufficient documentation

## 2016-09-14 DIAGNOSIS — E1143 Type 2 diabetes mellitus with diabetic autonomic (poly)neuropathy: Secondary | ICD-10-CM | POA: Diagnosis present

## 2016-09-14 DIAGNOSIS — Z1159 Encounter for screening for other viral diseases: Secondary | ICD-10-CM

## 2016-09-14 DIAGNOSIS — E1165 Type 2 diabetes mellitus with hyperglycemia: Secondary | ICD-10-CM | POA: Diagnosis not present

## 2016-09-14 DIAGNOSIS — Z114 Encounter for screening for human immunodeficiency virus [HIV]: Secondary | ICD-10-CM | POA: Diagnosis not present

## 2016-09-14 DIAGNOSIS — G4700 Insomnia, unspecified: Secondary | ICD-10-CM | POA: Diagnosis not present

## 2016-09-14 DIAGNOSIS — E785 Hyperlipidemia, unspecified: Secondary | ICD-10-CM | POA: Insufficient documentation

## 2016-09-14 DIAGNOSIS — I10 Essential (primary) hypertension: Secondary | ICD-10-CM | POA: Diagnosis not present

## 2016-09-14 DIAGNOSIS — Z87891 Personal history of nicotine dependence: Secondary | ICD-10-CM | POA: Diagnosis not present

## 2016-09-14 DIAGNOSIS — F329 Major depressive disorder, single episode, unspecified: Secondary | ICD-10-CM | POA: Insufficient documentation

## 2016-09-14 LAB — GLUCOSE, POCT (MANUAL RESULT ENTRY): POC Glucose: 139 mg/dl — AB (ref 70–99)

## 2016-09-14 MED ORDER — DICLOFENAC SODIUM 1 % TD GEL: 4.0000 g | Freq: Four times a day (QID) | TRANSDERMAL | 3 refills | Status: DC

## 2016-09-14 MED ORDER — MEDICAL COMPRESSION SOCKS MISC: 1.0000 | Freq: Every day | 0 refills | Status: DC

## 2016-09-14 MED ORDER — EPLERENONE 50 MG PO TABS: 50.0000 mg | ORAL_TABLET | Freq: Every day | ORAL | 3 refills | Status: DC

## 2016-09-14 MED ORDER — ZOLPIDEM TARTRATE 5 MG PO TABS: 5.0000 mg | ORAL_TABLET | Freq: Every evening | ORAL | 0 refills | Status: DC | PRN

## 2016-09-14 MED ORDER — CYCLOBENZAPRINE HCL 10 MG PO TABS: 10.0000 mg | ORAL_TABLET | Freq: Three times a day (TID) | ORAL | 2 refills | Status: DC | PRN

## 2016-09-14 NOTE — Assessment & Plan Note (Signed)
Well controlled Continue current regimen Repeat A1c in 10/20/16

## 2016-09-14 NOTE — Progress Notes (Signed)
Subjective:  Patient ID: Nancy [name], female    DOB: [birth year]  Age: 55 y.o. MRN: [id number]  CC: Diabetes   HPI BECKEY POLKOWSKI presents for    1. CHRONIC DIABETES  Disease Monitoring  Blood Sugar Ranges:   Fasting: 187-297  Post prandial: 291     Polyuria: yes   Visual problems: yes   Medication Compliance: yes, lantus 44 U once daily, taking novolog 10 U TID  Medication Side Effects  Hypoglycemia: no   Preventitive Health Care  Eye Exam: due   Foot Exam: done today   Diet pattern: eats 2 meals a day, lunch and dinner, coffee in AM,  no snacks   Exercise: no   2. HTN: compliant with BP medication. No cough, CP, SOB, HA. Swelling in feet is painful.   3. Insomnia: trouble falling asleep and staying asleep. Worried about new job, started 5 months ago, pay cut, finances. Has tried benadryl and Ambien in the past. Sleeps 2-3 hrs per night.   4. Muscle and joint pains: this is chronic. Tylenol helps temporarily. No joint swelling. Massage also helps.   Past Medical History:  Diagnosis Date  . Anemia 2006  . Depression 2014   previously on amitryptiline   . Diabetes mellitus with neurological manifestation ([place]) 2006  . Fracture of left ankle 1997   . Gastroparesis 07/2016  . HOH (hard of hearing) 2004   . Hyperlipidemia 2006  . Hypertension 2006  . IBS (irritable bowel syndrome) 2002   . Leukopenia 2015   . Shingles 2009   . Thyroid nodule 2004    Social History  Substance Use Topics  . Smoking status: Former Smoker    Packs/day: 0.25    Years: 0.50    Types: Cigarettes  . Smokeless tobacco: Never Used  . Alcohol use Yes     Comment: occasion     Outpatient Medications Prior to Visit  Medication Sig Dispense Refill  . acetaminophen (TYLENOL) 500 MG tablet Take 1,000 mg by mouth every 6 (six) hours as needed for mild pain.    Marland Kitchen amLODipine (NORVASC) 10 MG tablet Take 1 tablet (10 mg total) by mouth daily. 30 tablet 2  . atorvastatin  (LIPITOR) 40 MG tablet Take 1 tablet (40 mg total) by mouth daily. 90 tablet 3  . cyclobenzaprine (FLEXERIL) 10 MG tablet Take 1 tablet (10 mg total) by mouth 3 (three) times daily as needed for muscle spasms. 30 tablet 0  . famotidine (PEPCID) 20 MG tablet Take 1 tablet (20 mg total) by mouth 2 (two) times daily. 30 tablet 0  . hydrALAZINE (APRESOLINE) 100 MG tablet Take 1 tablet (100 mg total) by mouth every 8 (eight) hours. 90 tablet 2  . hydrochlorothiazide (HYDRODIURIL) 25 MG tablet Take 1 tablet (25 mg total) by mouth daily. 30 tablet 2  . insulin aspart (NOVOLOG FLEXPEN) 100 UNIT/ML FlexPen Inject 10 Units into the skin 3 (three) times daily with meals. 15 mL 11  . Insulin Glargine (LANTUS SOLOSTAR) 100 UNIT/ML Solostar Pen Inject 44 Units into the skin daily. 5 pen 5  . ketoconazole (NIZORAL) 2 % cream Apply 1 application topically daily. To rash between toes 15 g 0  . lisinopril (PRINIVIL,ZESTRIL) 20 MG tablet Take 1 tablet (20 mg total) by mouth daily. 30 tablet 2  . metoCLOPramide (REGLAN) 10 MG tablet Take 1 tablet (10 mg total) by mouth every 6 (six) hours as needed for nausea. 30 tablet 0  . ondansetron (ZOFRAN)  4 MG tablet Take 1 tablet (4 mg total) by mouth every 8 (eight) hours as needed for nausea or vomiting. 20 tablet 0  . polyethylene glycol powder (GLYCOLAX/MIRALAX) powder Take 17 g by mouth daily. (Patient not taking: Reported on 09/14/2016) 3350 g 1   No facility-administered medications prior to visit.     ROS Review of Systems  Constitutional: Positive for fatigue. Negative for chills, fever and unexpected weight change.  Eyes: Positive for visual disturbance.  Respiratory: Negative for shortness of breath.   Cardiovascular: Negative for chest pain.  Gastrointestinal: Positive for constipation and nausea. Negative for abdominal pain and blood in stool.  Endocrine: Positive for polyuria. Negative for polydipsia.  Musculoskeletal: Positive for arthralgias and myalgias.  Negative for back pain and joint swelling.  Skin: Negative for rash.  Allergic/Immunologic: Negative for immunocompromised state.  Hematological: Negative for adenopathy. Does not bruise/bleed easily.  Psychiatric/Behavioral: Negative for dysphoric mood and suicidal ideas. The patient is nervous/anxious (financial stressors ).     Objective:  BP 120/83 (BP Location: Right Arm, Patient Position: Sitting, Cuff Size: Small)   Pulse 88   Temp 98.6 F (37 C) (Oral)   Ht 5\' 3"  (1.6 m)   Wt 194 lb 9.6 oz (88.3 kg)   SpO2 99%   BMI 34.47 kg/m   BP/Weight 09/14/2016 08/17/2016 2/69/4854  Systolic BP 627 035 009  Diastolic BP 83 76 84  Wt. (Lbs) 194.6 186.6 180.8  BMI 34.47 33.05 32.03   Wt Readings from Last 3 Encounters:  09/14/16 194 lb 9.6 oz (88.3 kg)  08/17/16 186 lb 9.6 oz (84.6 kg)  07/26/16 180 lb 12.8 oz (82 kg)    Physical Exam  Constitutional: She is oriented to person, place, and time. She appears well-developed and well-nourished. No distress.  HENT:  Head: Normocephalic and atraumatic.  Eyes: Conjunctivae and EOM are normal. Pupils are equal, round, and reactive to light.  Cardiovascular: Normal rate, regular rhythm, normal heart sounds and intact distal pulses.   Pulmonary/Chest: Effort normal and breath sounds normal.  Abdominal: Soft. Bowel sounds are normal. She exhibits no distension. There is no tenderness. There is no rebound and no guarding.  Musculoskeletal: She exhibits no edema.  Neurological: She is alert and oriented to person, place, and time.  Skin: Skin is warm and dry. No rash noted.     Psychiatric: She has a normal mood and affect.     Chemistry      Component Value Date/Time   NA 134 (L) 07/26/2016 1349   K 3.5 07/26/2016 1349   CL 100 (L) 07/26/2016 1349   CO2 23 07/26/2016 1349   BUN 16 07/26/2016 1349   CREATININE 0.80 07/26/2016 1349   CREATININE 0.73 08/17/2014 1641      Component Value Date/Time   CALCIUM 9.0 07/26/2016 1349    ALKPHOS 73 07/24/2016 0028   AST 13 (L) 07/24/2016 0028   ALT 11 (L) 07/24/2016 0028   BILITOT 0.7 07/24/2016 0028      Lab Results  Component Value Date   HGBA1C 13.3 (H) 07/23/2016   CBG 139  Assessment & Plan:  Revella was seen today for diabetes.  Diagnoses and all orders for this visit:  Uncontrolled type 2 diabetes mellitus with diabetic neuropathy, with long-term current use of insulin (HCC) -     POCT glucose (manual entry)  Essential hypertension -     eplerenone (INSPRA) 50 MG tablet; Take 1 tablet (50 mg total) by mouth daily. -  Basic Metabolic Panel; Future -     Discontinue: Elastic Bandages & Supports (MEDICAL COMPRESSION SOCKS) MISC; 1 each by Does not apply route daily. -     Elastic Bandages & Supports (MEDICAL COMPRESSION SOCKS) MISC; 1 each by Does not apply route daily. 25-30 mm Hg pressure  Myalgia -     cyclobenzaprine (FLEXERIL) 10 MG tablet; Take 1 tablet (10 mg total) by mouth 3 (three) times daily as needed for muscle spasms. -     diclofenac sodium (VOLTAREN) 1 % GEL; Apply 4 g topically 4 (four) times daily.  Insomnia -     zolpidem (AMBIEN) 5 MG tablet; Take 1 tablet (5 mg total) by mouth at bedtime as needed for sleep.  Screening for HIV (human immunodeficiency virus) -     HIV antibody (with reflex); Future  Need for hepatitis C screening test -     Hepatitis C antibody, reflex; Future  Other orders -     Cancel: POCT glycosylated hemoglobin (Hb A1C)   There are no diagnoses linked to this encounter.  No orders of the defined types were placed in this encounter.   Follow-up: Return in about 4 weeks (around 10/12/2016) for HTN .   Boykin Nearing MD

## 2016-09-14 NOTE — Progress Notes (Signed)
Flu is complete

## 2016-09-14 NOTE — Assessment & Plan Note (Signed)
Recurrent Add [place]

## 2016-09-14 NOTE — Assessment & Plan Note (Signed)
Controlled with foot swelling  Plan: Stop norvasc Add eplerenone 50 mg daily Continue all other meds Closely monitor K+  Compression stockings

## 2016-09-14 NOTE — Assessment & Plan Note (Signed)
Chronic Topical NSAID

## 2016-09-14 NOTE — Patient Instructions (Addendum)
Nancy [name] was seen today for diabetes.  Diagnoses and all orders for this visit:  Uncontrolled type 2 diabetes mellitus with diabetic neuropathy, with long-term current use of insulin (HCC) -     POCT glucose (manual entry)  Essential hypertension -     eplerenone (INSPRA) 50 MG tablet; Take 1 tablet (50 mg total) by mouth daily. -     Basic Metabolic Panel; Future  Myalgia -     cyclobenzaprine (FLEXERIL) 10 MG tablet; Take 1 tablet (10 mg total) by mouth 3 (three) times daily as needed for muscle spasms. -     diclofenac sodium (VOLTAREN) 1 % GEL; Apply 4 g topically 4 (four) times daily.  Insomnia -     zolpidem (AMBIEN) 5 MG tablet; Take 1 tablet (5 mg total) by mouth at bedtime as needed for sleep.  Other orders -     Cancel: POCT glycosylated hemoglobin (Hb A1C)   F/U IN 2 Weeks  FOR LABS   F/U IN 4 WEEKS FOR VISIT WITH ME for HTN and insomnia as well as muscle  aches   Dr. Adrian Blackwater

## 2016-09-15 MED FILL — DICLOFENAC SODIUM 1% GEL: 1 | 30 days supply | Qty: 100 | Fill #0

## 2016-09-15 MED FILL — CYCLOBENZAPRINE 10 MG TAB: 10 | 20 days supply | Qty: 60 | Fill #0

## 2016-09-17 MED FILL — EPLERENONE 50 MG TABLET: 50 | 30 days supply | Qty: 30 | Fill #0

## 2016-10-01 ENCOUNTER — Other Ambulatory Visit: Payer: Managed Care, Other (non HMO)

## 2016-10-25 ENCOUNTER — Encounter: Payer: Self-pay | Admitting: Family Medicine

## 2016-10-25 ENCOUNTER — Ambulatory Visit: Payer: Managed Care, Other (non HMO) | Attending: Family Medicine | Admitting: Family Medicine

## 2016-10-25 VITALS — BP 150/100 | HR 90 | Temp 98.1°F | Ht 63.0 in | Wt 199.6 lb

## 2016-10-25 DIAGNOSIS — Z794 Long term (current) use of insulin: Secondary | ICD-10-CM | POA: Diagnosis not present

## 2016-10-25 DIAGNOSIS — M542 Cervicalgia: Secondary | ICD-10-CM | POA: Diagnosis not present

## 2016-10-25 DIAGNOSIS — E669 Obesity, unspecified: Secondary | ICD-10-CM | POA: Diagnosis not present

## 2016-10-25 DIAGNOSIS — L309 Dermatitis, unspecified: Secondary | ICD-10-CM | POA: Diagnosis not present

## 2016-10-25 DIAGNOSIS — E1165 Type 2 diabetes mellitus with hyperglycemia: Secondary | ICD-10-CM | POA: Diagnosis not present

## 2016-10-25 DIAGNOSIS — Z87891 Personal history of nicotine dependence: Secondary | ICD-10-CM | POA: Insufficient documentation

## 2016-10-25 DIAGNOSIS — I10 Essential (primary) hypertension: Secondary | ICD-10-CM | POA: Insufficient documentation

## 2016-10-25 DIAGNOSIS — Z79899 Other long term (current) drug therapy: Secondary | ICD-10-CM | POA: Diagnosis not present

## 2016-10-25 DIAGNOSIS — M791 Myalgia, unspecified site: Secondary | ICD-10-CM

## 2016-10-25 DIAGNOSIS — G8929 Other chronic pain: Secondary | ICD-10-CM

## 2016-10-25 DIAGNOSIS — E1140 Type 2 diabetes mellitus with diabetic neuropathy, unspecified: Secondary | ICD-10-CM | POA: Insufficient documentation

## 2016-10-25 DIAGNOSIS — M5441 Lumbago with sciatica, right side: Secondary | ICD-10-CM

## 2016-10-25 DIAGNOSIS — G4700 Insomnia, unspecified: Secondary | ICD-10-CM | POA: Diagnosis not present

## 2016-10-25 DIAGNOSIS — IMO0002 Reserved for concepts with insufficient information to code with codable children: Secondary | ICD-10-CM

## 2016-10-25 LAB — POCT GLYCOSYLATED HEMOGLOBIN (HGB A1C): Hemoglobin A1C: 10.7

## 2016-10-25 LAB — GLUCOSE, POCT (MANUAL RESULT ENTRY): POC Glucose: 166 mg/dl — AB (ref 70–99)

## 2016-10-25 MED ORDER — EPLERENONE 50 MG PO TABS: 50.0000 mg | ORAL_TABLET | Freq: Every day | ORAL | 5 refills | Status: DC

## 2016-10-25 MED ORDER — HYDROCORTISONE VALERATE 0.2 % EX OINT: 1.0000 "application " | TOPICAL_OINTMENT | Freq: Two times a day (BID) | CUTANEOUS | 0 refills | Status: DC

## 2016-10-25 MED ORDER — LISINOPRIL 20 MG PO TABS: 20.0000 mg | ORAL_TABLET | Freq: Every day | ORAL | 5 refills | Status: DC

## 2016-10-25 MED ORDER — HYDRALAZINE HCL 100 MG PO TABS: 100.0000 mg | ORAL_TABLET | Freq: Three times a day (TID) | ORAL | 5 refills | Status: DC

## 2016-10-25 MED ORDER — ZOLPIDEM TARTRATE 5 MG PO TABS: 5.0000 mg | ORAL_TABLET | Freq: Every evening | ORAL | 2 refills | Status: DC | PRN

## 2016-10-25 MED ORDER — CELECOXIB 100 MG PO CAPS: 100.0000 mg | ORAL_CAPSULE | Freq: Two times a day (BID) | ORAL | 2 refills | Status: DC

## 2016-10-25 MED ORDER — CYCLOBENZAPRINE HCL 10 MG PO TABS: 10.0000 mg | ORAL_TABLET | Freq: Three times a day (TID) | ORAL | 2 refills | Status: DC | PRN

## 2016-10-25 MED ORDER — HYDROCHLOROTHIAZIDE 25 MG PO TABS: 25.0000 mg | ORAL_TABLET | Freq: Every day | ORAL | 5 refills | Status: DC

## 2016-10-25 NOTE — Patient Instructions (Addendum)
Nancy [name] was seen today for back pain and neck pain.  Diagnoses and all orders for this visit:  Uncontrolled type 2 diabetes mellitus with diabetic neuropathy, with long-term current use of insulin (HCC) -     POCT glucose (manual entry) -     POCT glycosylated hemoglobin (Hb A1C)  Essential hypertension -     lisinopril (PRINIVIL,ZESTRIL) 20 MG tablet; Take 1 tablet (20 mg total) by mouth daily. -     eplerenone (INSPRA) 50 MG tablet; Take 1 tablet (50 mg total) by mouth daily. -     hydrochlorothiazide (HYDRODIURIL) 25 MG tablet; Take 1 tablet (25 mg total) by mouth daily. -     hydrALAZINE (APRESOLINE) 100 MG tablet; Take 1 tablet (100 mg total) by mouth every 8 (eight) hours.  Myalgia -     cyclobenzaprine (FLEXERIL) 10 MG tablet; Take 1 tablet (10 mg total) by mouth 3 (three) times daily as needed for muscle spasms.  Insomnia, unspecified type -     zolpidem (AMBIEN) 5 MG tablet; Take 1 tablet (5 mg total) by mouth at bedtime as needed for sleep.  Chronic cervical pain -     celecoxib (CELEBREX) 100 MG capsule; Take 1 capsule (100 mg total) by mouth 2 (two) times daily. -     cyclobenzaprine (FLEXERIL) 10 MG tablet; Take 1 tablet (10 mg total) by mouth 3 (three) times daily as needed for muscle spasms. -     Ambulatory referral to Physical Therapy -     DG Cervical Spine 2 or 3 views; Future  Chronic bilateral low back pain with right-sided sciatica -     celecoxib (CELEBREX) 100 MG capsule; Take 1 capsule (100 mg total) by mouth 2 (two) times daily. -     cyclobenzaprine (FLEXERIL) 10 MG tablet; Take 1 tablet (10 mg total) by mouth 3 (three) times daily as needed for muscle spasms. -     Ambulatory referral to Physical Therapy -     DG Lumbar Spine 2-3 Views; Future   Please complete x-rays I have referred you to physical therapy  F/u in 4 weeks for HTN and neck and back pain  Dr. Adrian Blackwater

## 2016-10-25 NOTE — Progress Notes (Signed)
Subjective:  Patient ID: Nancy [name], female    DOB: [birth year]  Age: 55 y.o. MRN: [id number]  CC: Back Pain and Neck Pain   HPI Rosalita Chessman Moreheadhas diabetes, HTN, insomnia, obesity she  presents for    1. Chronic back pain: started in 2013. No previous injury. Has previous MRI. Over last 6-7 months having worsening pain on the R side. No loss or urine or stool. No groin numbness. She has weakness in her R leg. No fever or chills. Current pain is 6/10.   MRI in low back done 04/16/2003:  IMPRESSION MILD FACET DEGENERATIVE CHANGES AT L5-S1.  NEGATIVE FOR EVIDENCE OF DISK HERNIATION. FIVE VIEW LUMBAR SPINE PATIENT WITH FIVE NONRIBBEARING LUMBAR TYPE VERTEBRAE.  THE ALIGNMENT IS ANATOMIC.  DISK SPACE IS WELL PRESERVED.  THE OBLIQUE VIEWS SHOW MILD FACET DEGENERATIVE CHANGES AT L5-S1. IMPRESSION MILD FACET DEGENERATIVE CHANGES, LOWER LUMBAR SPINE.   2. chronic neck pain: since 2013. Has knot on R side behind R ear. Also getting pain on L side for past 4 weeks. Also some anterior neck cramps and posterior neck pain. Posterior neck pain has been off and on for the past 6 months. Current pain is 7/10.   Social History  Substance Use Topics  . Smoking status: Former Smoker    Packs/day: 0.25    Years: 0.50    Types: Cigarettes  . Smokeless tobacco: Never Used  . Alcohol use Yes     Comment: occasion     Outpatient Medications Prior to Visit  Medication Sig Dispense Refill  . acetaminophen (TYLENOL) 500 MG tablet Take 1,000 mg by mouth every 6 (six) hours as needed for mild pain.    Marland Kitchen atorvastatin (LIPITOR) 40 MG tablet Take 1 tablet (40 mg total) by mouth daily. 90 tablet 3  . cyclobenzaprine (FLEXERIL) 10 MG tablet Take 1 tablet (10 mg total) by mouth 3 (three) times daily as needed for muscle spasms. 60 tablet 2  . diclofenac sodium (VOLTAREN) 1 % GEL Apply 4 g topically 4 (four) times daily. 100 g 3  . eplerenone (INSPRA) 50 MG tablet Take 1 tablet (50 mg total) by  mouth daily. 30 tablet 3  . famotidine (PEPCID) 20 MG tablet Take 1 tablet (20 mg total) by mouth 2 (two) times daily. 30 tablet 0  . hydrALAZINE (APRESOLINE) 100 MG tablet Take 1 tablet (100 mg total) by mouth every 8 (eight) hours. 90 tablet 2  . hydrochlorothiazide (HYDRODIURIL) 25 MG tablet Take 1 tablet (25 mg total) by mouth daily. 30 tablet 2  . insulin aspart (NOVOLOG FLEXPEN) 100 UNIT/ML FlexPen Inject 10 Units into the skin 3 (three) times daily with meals. 15 mL 11  . Insulin Glargine (LANTUS SOLOSTAR) 100 UNIT/ML Solostar Pen Inject 44 Units into the skin daily. 5 pen 5  . ketoconazole (NIZORAL) 2 % cream Apply 1 application topically daily. To rash between toes 15 g 0  . lisinopril (PRINIVIL,ZESTRIL) 20 MG tablet Take 1 tablet (20 mg total) by mouth daily. 30 tablet 2  . metoCLOPramide (REGLAN) 10 MG tablet Take 1 tablet (10 mg total) by mouth every 6 (six) hours as needed for nausea. 30 tablet 0  . ondansetron (ZOFRAN) 4 MG tablet Take 1 tablet (4 mg total) by mouth every 8 (eight) hours as needed for nausea or vomiting. 20 tablet 0  . zolpidem (AMBIEN) 5 MG tablet Take 1 tablet (5 mg total) by mouth at bedtime as needed for sleep. 30 tablet 0  .  Elastic Bandages & Supports (MEDICAL COMPRESSION SOCKS) MISC 1 each by Does not apply route daily. 25-30 mm Hg pressure (Patient not taking: Reported on 10/25/2016) 2 each 0  . polyethylene glycol powder (GLYCOLAX/MIRALAX) powder Take 17 g by mouth daily. (Patient not taking: Reported on 10/25/2016) 3350 g 1   No facility-administered medications prior to visit.     ROS Review of Systems  Constitutional: Positive for fatigue. Negative for chills, fever and unexpected weight change.  Eyes: Positive for visual disturbance.  Respiratory: Negative for shortness of breath.   Cardiovascular: Negative for chest pain.  Gastrointestinal: Positive for constipation and nausea. Negative for abdominal pain and blood in stool.  Endocrine: Positive for  polyuria. Negative for polydipsia.  Musculoskeletal: Positive for arthralgias, myalgias, neck pain and neck stiffness. Negative for back pain and joint swelling.  Skin: Negative for rash.  Allergic/Immunologic: Negative for immunocompromised state.  Hematological: Negative for adenopathy. Does not bruise/bleed easily.  Psychiatric/Behavioral: Positive for sleep disturbance. Negative for dysphoric mood and suicidal ideas. The patient is nervous/anxious (financial stressors ).     Objective:  BP (!) 150/100 (BP Location: Left Arm, Patient Position: Sitting, Cuff Size: Small)   Pulse 90   Temp 98.1 F (36.7 C) (Oral)   Ht 5\' 3"  (1.6 m)   Wt 199 lb 9.6 oz (90.5 kg)   SpO2 98%   BMI 35.36 kg/m   BP/Weight 10/25/2016 3/81/8299 02/21/1695  Systolic BP 789 381 017  Diastolic BP 510 83 76  Wt. (Lbs) 199.6 194.6 186.6  BMI 35.36 34.47 33.05   Wt Readings from Last 3 Encounters:  10/25/16 199 lb 9.6 oz (90.5 kg)  09/14/16 194 lb 9.6 oz (88.3 kg)  08/17/16 186 lb 9.6 oz (84.6 kg)    Physical Exam  Constitutional: She is oriented to person, place, and time. She appears well-developed and well-nourished. No distress.  HENT:  Head: Normocephalic and atraumatic.    Eyes: Conjunctivae and EOM are normal. Pupils are equal, round, and reactive to light.  Cardiovascular: Normal rate, regular rhythm, normal heart sounds and intact distal pulses.   Pulmonary/Chest: Effort normal and breath sounds normal.  Abdominal: Soft. Bowel sounds are normal. She exhibits no distension. There is no tenderness. There is no rebound and no guarding.  Musculoskeletal: She exhibits no edema.  Neurological: She is alert and oriented to person, place, and time.  Skin: Skin is warm and dry. No rash noted.     Psychiatric: She has a normal mood and affect.     Chemistry      Component Value Date/Time   NA 134 (L) 07/26/2016 1349   K 3.5 07/26/2016 1349   CL 100 (L) 07/26/2016 1349   CO2 23 07/26/2016 1349    BUN 16 07/26/2016 1349   CREATININE 0.80 07/26/2016 1349   CREATININE 0.73 08/17/2014 1641      Component Value Date/Time   CALCIUM 9.0 07/26/2016 1349   ALKPHOS 73 07/24/2016 0028   AST 13 (L) 07/24/2016 0028   ALT 11 (L) 07/24/2016 0028   BILITOT 0.7 07/24/2016 0028      Lab Results  Component Value Date   HGBA1C 10.7 10/25/2016   CBG 139  Assessment & Plan:  Carri was seen today for back pain and neck pain.  Diagnoses and all orders for this visit:  Uncontrolled type 2 diabetes mellitus with diabetic neuropathy, with long-term current use of insulin (HCC) -     POCT glucose (manual entry) -  POCT glycosylated hemoglobin (Hb A1C)  Essential hypertension -     lisinopril (PRINIVIL,ZESTRIL) 20 MG tablet; Take 1 tablet (20 mg total) by mouth daily. -     eplerenone (INSPRA) 50 MG tablet; Take 1 tablet (50 mg total) by mouth daily. -     hydrochlorothiazide (HYDRODIURIL) 25 MG tablet; Take 1 tablet (25 mg total) by mouth daily. -     hydrALAZINE (APRESOLINE) 100 MG tablet; Take 1 tablet (100 mg total) by mouth every 8 (eight) hours.  Myalgia -     cyclobenzaprine (FLEXERIL) 10 MG tablet; Take 1 tablet (10 mg total) by mouth 3 (three) times daily as needed for muscle spasms.  Insomnia, unspecified type -     zolpidem (AMBIEN) 5 MG tablet; Take 1 tablet (5 mg total) by mouth at bedtime as needed for sleep.  Chronic cervical pain -     celecoxib (CELEBREX) 100 MG capsule; Take 1 capsule (100 mg total) by mouth 2 (two) times daily. -     cyclobenzaprine (FLEXERIL) 10 MG tablet; Take 1 tablet (10 mg total) by mouth 3 (three) times daily as needed for muscle spasms. -     Ambulatory referral to Physical Therapy -     DG Cervical Spine 2 or 3 views; Future  Chronic bilateral low back pain with right-sided sciatica -     celecoxib (CELEBREX) 100 MG capsule; Take 1 capsule (100 mg total) by mouth 2 (two) times daily. -     cyclobenzaprine (FLEXERIL) 10 MG tablet; Take 1  tablet (10 mg total) by mouth 3 (three) times daily as needed for muscle spasms. -     Ambulatory referral to Physical Therapy -     DG Lumbar Spine 2-3 Views; Future  Dermatitis -     hydrocortisone valerate ointment (WESTCORT) 0.2 %; Apply 1 application topically 2 (two) times daily.   There are no diagnoses linked to this encounter.  No orders of the defined types were placed in this encounter.   Follow-up: Return in about 4 weeks (around 11/22/2016) for HTN, neck and low back pain .   Boykin Nearing MD

## 2016-10-25 NOTE — Progress Notes (Signed)
Pt is here today for pain in the neck and back.

## 2016-10-29 ENCOUNTER — Ambulatory Visit (HOSPITAL_COMMUNITY)
Admission: RE | Admit: 2016-10-29 | Discharge: 2016-10-29 | Disposition: A | Discharge status: Home / Self Care | Payer: Managed Care, Other (non HMO) | Source: Ambulatory Visit | Attending: Family Medicine | Admitting: Family Medicine

## 2016-10-29 DIAGNOSIS — M542 Cervicalgia: Secondary | ICD-10-CM | POA: Diagnosis not present

## 2016-10-29 DIAGNOSIS — G8929 Other chronic pain: Secondary | ICD-10-CM | POA: Diagnosis not present

## 2016-10-29 DIAGNOSIS — M5441 Lumbago with sciatica, right side: Secondary | ICD-10-CM | POA: Diagnosis not present

## 2016-10-29 DIAGNOSIS — M47892 Other spondylosis, cervical region: Secondary | ICD-10-CM | POA: Diagnosis not present

## 2016-10-29 DIAGNOSIS — L309 Dermatitis, unspecified: Secondary | ICD-10-CM | POA: Insufficient documentation

## 2016-10-29 NOTE — Assessment & Plan Note (Signed)
Chronic neck pain No weakness Adding celebrex

## 2016-10-29 NOTE — Assessment & Plan Note (Signed)
Elevated BP Previously controlled HTN Close f.u for recheck

## 2016-10-29 NOTE — Assessment & Plan Note (Signed)
Topical steroid cream

## 2016-10-29 NOTE — Assessment & Plan Note (Signed)
Chronic pain Adding celebrex

## 2016-11-06 ENCOUNTER — Telehealth: Payer: Self-pay | Admitting: Physical Therapy

## 2016-11-06 NOTE — Telephone Encounter (Signed)
10/29/16 spoke with patient, said she would call back 11/06/16 tried to contact patient, mailbox full

## 2016-12-04 ENCOUNTER — Ambulatory Visit: Payer: Managed Care, Other (non HMO) | Attending: Family Medicine | Admitting: Family Medicine

## 2016-12-04 ENCOUNTER — Encounter: Payer: Self-pay | Admitting: Family Medicine

## 2016-12-04 VITALS — BP 125/85 | HR 98 | Temp 98.4°F | Ht 63.0 in | Wt 195.2 lb

## 2016-12-04 DIAGNOSIS — M542 Cervicalgia: Secondary | ICD-10-CM | POA: Diagnosis not present

## 2016-12-04 DIAGNOSIS — G4700 Insomnia, unspecified: Secondary | ICD-10-CM | POA: Diagnosis not present

## 2016-12-04 DIAGNOSIS — M791 Myalgia, unspecified site: Secondary | ICD-10-CM

## 2016-12-04 DIAGNOSIS — Z6834 Body mass index (BMI) 34.0-34.9, adult: Secondary | ICD-10-CM | POA: Diagnosis not present

## 2016-12-04 DIAGNOSIS — E1140 Type 2 diabetes mellitus with diabetic neuropathy, unspecified: Secondary | ICD-10-CM | POA: Diagnosis not present

## 2016-12-04 DIAGNOSIS — IMO0002 Reserved for concepts with insufficient information to code with codable children: Secondary | ICD-10-CM

## 2016-12-04 DIAGNOSIS — Z87891 Personal history of nicotine dependence: Secondary | ICD-10-CM | POA: Insufficient documentation

## 2016-12-04 DIAGNOSIS — G8929 Other chronic pain: Secondary | ICD-10-CM

## 2016-12-04 DIAGNOSIS — I10 Essential (primary) hypertension: Secondary | ICD-10-CM | POA: Insufficient documentation

## 2016-12-04 DIAGNOSIS — M5441 Lumbago with sciatica, right side: Secondary | ICD-10-CM | POA: Diagnosis not present

## 2016-12-04 DIAGNOSIS — E1165 Type 2 diabetes mellitus with hyperglycemia: Secondary | ICD-10-CM | POA: Diagnosis not present

## 2016-12-04 DIAGNOSIS — Z79899 Other long term (current) drug therapy: Secondary | ICD-10-CM | POA: Diagnosis not present

## 2016-12-04 DIAGNOSIS — E669 Obesity, unspecified: Secondary | ICD-10-CM | POA: Insufficient documentation

## 2016-12-04 DIAGNOSIS — Z794 Long term (current) use of insulin: Secondary | ICD-10-CM

## 2016-12-04 LAB — POCT URINALYSIS DIPSTICK
BILIRUBIN UA: NEGATIVE
GLUCOSE UA: 500
Ketones, UA: NEGATIVE
Leukocytes, UA: NEGATIVE
NITRITE UA: NEGATIVE
RBC UA: NEGATIVE
Spec Grav, UA: 1.01
Urobilinogen, UA: 0.2
pH, UA: 5.5

## 2016-12-04 LAB — GLUCOSE, POCT (MANUAL RESULT ENTRY): POC GLUCOSE: 387 mg/dL — AB (ref 70–99)

## 2016-12-04 MED ORDER — INSULIN GLARGINE 100 UNIT/ML SOLOSTAR PEN: 50.0000 [IU] | PEN_INJECTOR | Freq: Every day | SUBCUTANEOUS | 5 refills | Status: DC

## 2016-12-04 MED ORDER — CYCLOBENZAPRINE HCL 10 MG PO TABS: 5.0000 mg | ORAL_TABLET | Freq: Three times a day (TID) | ORAL | 5 refills | Status: DC | PRN

## 2016-12-04 MED ORDER — PEN NEEDLES 32G X 4 MM MISC: 1.0000 | Freq: Four times a day (QID) | 11 refills | Status: DC

## 2016-12-04 MED ORDER — INSULIN ASPART 100 UNIT/ML ~~LOC~~ SOLN
20.0000 [IU] | Freq: Once | SUBCUTANEOUS | Status: AC
  Administered 2016-12-04: 20 [IU] via SUBCUTANEOUS

## 2016-12-04 MED ORDER — INSULIN LISPRO 100 UNIT/ML (KWIKPEN): 15.0000 [IU] | PEN_INJECTOR | Freq: Three times a day (TID) | SUBCUTANEOUS | 11 refills | Status: DC

## 2016-12-04 MED ORDER — TURMERIC 500 MG PO CAPS: 500.0000 mg | ORAL_CAPSULE | Freq: Every day | ORAL | Status: DC

## 2016-12-04 MED ORDER — ZOLPIDEM TARTRATE 10 MG PO TABS: 10.0000 mg | ORAL_TABLET | Freq: Every evening | ORAL | 5 refills | Status: DC | PRN

## 2016-12-04 MED ORDER — EPLERENONE 50 MG PO TABS: 50.0000 mg | ORAL_TABLET | Freq: Every day | ORAL | 5 refills | Status: DC

## 2016-12-04 MED ORDER — GLUCOSAMINE-CHONDROITIN 750-600 MG PO TABS: 2.0000 | ORAL_TABLET | Freq: Every day | ORAL | 0 refills | Status: DC

## 2016-12-04 MED ORDER — HYDROCHLOROTHIAZIDE 25 MG PO TABS: 25.0000 mg | ORAL_TABLET | Freq: Every day | ORAL | 5 refills | Status: DC

## 2016-12-04 MED ORDER — NAPROXEN 500 MG PO TABS: 500.0000 mg | ORAL_TABLET | Freq: Two times a day (BID) | ORAL | 0 refills | Status: DC

## 2016-12-04 NOTE — Assessment & Plan Note (Signed)
Chronic pain with DJD Naproxen ordered Add tumeric and glucosamine-chondroitin

## 2016-12-04 NOTE — Patient Instructions (Addendum)
Nancy [name] was seen today for hypertension.  Diagnoses and all orders for this visit:  Uncontrolled type 2 diabetes mellitus with diabetic neuropathy, with long-term current use of insulin (HCC) -     Glucose (CBG) -     insulin aspart (novoLOG) injection 20 Units; Inject 0.2 mLs (20 Units total) into the skin once. -     insulin lispro (HUMALOG KWIKPEN) 100 UNIT/ML KiwkPen; Inject 0.15 mLs (15 Units total) into the skin 3 (three) times daily. -     Insulin Glargine (LANTUS SOLOSTAR) 100 UNIT/ML Solostar Pen; Inject 50 Units into the skin daily. -     Insulin Pen Needle (PEN NEEDLES) 32G X 4 MM MISC; 1 each by Does not apply route 4 (four) times daily.  Essential hypertension -     hydrochlorothiazide (HYDRODIURIL) 25 MG tablet; Take 1 tablet (25 mg total) by mouth daily. -     eplerenone (INSPRA) 50 MG tablet; Take 1 tablet (50 mg total) by mouth daily.  Chronic cervical pain -     naproxen (NAPROSYN) 500 MG tablet; Take 1 tablet (500 mg total) by mouth 2 (two) times daily with a meal.  Insomnia, unspecified type -     zolpidem (AMBIEN) 10 MG tablet; Take 1 tablet (10 mg total) by mouth at bedtime as needed for sleep.    Diabetes blood sugar goals  Fasting (in AM before breakfast, 8 hrs of no eating or drinking (except water or unsweetened coffee or tea): 90-110 2 hrs after meals: < 160,   No low sugars: nothing < 70    F/u in 4 weeks for diabetes and chronic neck pain   Dr. Adrian Blackwater

## 2016-12-04 NOTE — Assessment & Plan Note (Signed)
BP at goal Compliant Continue current regimen

## 2016-12-04 NOTE — Assessment & Plan Note (Signed)
Uncontrolled with hyperglycemia Treated hyperglycemia in office today  Increase lantus to 50 U daily Change to humalog at 15 U TID

## 2016-12-04 NOTE — Progress Notes (Signed)
Subjective:  Patient ID: Nancy [name], female    DOB: [birth year]  Age: 55 y.o. MRN: [id number]  CC: Hypertension   HPI ARYIAH MONTEROSSO diabetes, HTN, insomnia, obesity she  presents for    1. Chronic back pain: started in 2013. No previous injury. Has previous MRI. Over last 6-7 months having worsening pain on the R side. No loss or urine or stool. No groin numbness. She has weakness in her R leg. No fever or chills. Current pain is 6/10.   MRI in low back done 04/16/2003:  IMPRESSION MILD FACET DEGENERATIVE CHANGES AT L5-S1.  NEGATIVE FOR EVIDENCE OF DISK HERNIATION. FIVE VIEW LUMBAR SPINE PATIENT WITH FIVE NONRIBBEARING LUMBAR TYPE VERTEBRAE.  THE ALIGNMENT IS ANATOMIC.  DISK SPACE IS WELL PRESERVED.  THE OBLIQUE VIEWS SHOW MILD FACET DEGENERATIVE CHANGES AT L5-S1. IMPRESSION MILD FACET DEGENERATIVE CHANGES, LOWER LUMBAR SPINE.   2. chronic neck pain: since 2013. Has knot on R side behind R ear. Also getting pain on L side for past 4 weeks. Also some anterior neck cramps and posterior neck pain. Posterior neck pain has been off and on for the past 6 months. Current pain is 7/10. She has not tried Celebrex. Flexeril at night has helped with pain.   3. Diabetes: taking novolog 10 U BID. Reports that her insurance will no longer cover novolog after 12/17/2016. Taking lantus. Fasting sugars in 190s or higher. Post prandial sugars in 200-300. Having pain in feet and hands, " feel broken". Not exercising.   Social History  Substance Use Topics  . Smoking status: Former Smoker    Packs/day: 0.25    Years: 0.50    Types: Cigarettes  . Smokeless tobacco: Never Used  . Alcohol use Yes     Comment: occasion     Outpatient Medications Prior to Visit  Medication Sig Dispense Refill  . acetaminophen (TYLENOL) 500 MG tablet Take 1,000 mg by mouth every 6 (six) hours as needed for mild pain.    Marland Kitchen atorvastatin (LIPITOR) 40 MG tablet Take 1 tablet (40 mg total) by mouth daily. 90  tablet 3  . celecoxib (CELEBREX) 100 MG capsule Take 1 capsule (100 mg total) by mouth 2 (two) times daily. 60 capsule 2  . cyclobenzaprine (FLEXERIL) 10 MG tablet Take 1 tablet (10 mg total) by mouth 3 (three) times daily as needed for muscle spasms. 60 tablet 2  . diclofenac sodium (VOLTAREN) 1 % GEL Apply 4 g topically 4 (four) times daily. 100 g 3  . Elastic Bandages & Supports (MEDICAL COMPRESSION SOCKS) MISC 1 each by Does not apply route daily. 25-30 mm Hg pressure (Patient not taking: Reported on 10/25/2016) 2 each 0  . eplerenone (INSPRA) 50 MG tablet Take 1 tablet (50 mg total) by mouth daily. 30 tablet 5  . famotidine (PEPCID) 20 MG tablet Take 1 tablet (20 mg total) by mouth 2 (two) times daily. 30 tablet 0  . hydrALAZINE (APRESOLINE) 100 MG tablet Take 1 tablet (100 mg total) by mouth every 8 (eight) hours. 90 tablet 5  . hydrochlorothiazide (HYDRODIURIL) 25 MG tablet Take 1 tablet (25 mg total) by mouth daily. 30 tablet 5  . hydrocortisone valerate ointment (WESTCORT) 0.2 % Apply 1 application topically 2 (two) times daily. 45 g 0  . insulin aspart (NOVOLOG FLEXPEN) 100 UNIT/ML FlexPen Inject 10 Units into the skin 3 (three) times daily with meals. 15 mL 11  . Insulin Glargine (LANTUS SOLOSTAR) 100 UNIT/ML Solostar Pen Inject 44 Units  into the skin daily. 5 pen 5  . ketoconazole (NIZORAL) 2 % cream Apply 1 application topically daily. To rash between toes 15 g 0  . lisinopril (PRINIVIL,ZESTRIL) 20 MG tablet Take 1 tablet (20 mg total) by mouth daily. 30 tablet 5  . metoCLOPramide (REGLAN) 10 MG tablet Take 1 tablet (10 mg total) by mouth every 6 (six) hours as needed for nausea. 30 tablet 0  . ondansetron (ZOFRAN) 4 MG tablet Take 1 tablet (4 mg total) by mouth every 8 (eight) hours as needed for nausea or vomiting. 20 tablet 0  . polyethylene glycol powder (GLYCOLAX/MIRALAX) powder Take 17 g by mouth daily. (Patient not taking: Reported on 10/25/2016) 3350 g 1  . zolpidem (AMBIEN) 5 MG  tablet Take 1 tablet (5 mg total) by mouth at bedtime as needed for sleep. 30 tablet 2   No facility-administered medications prior to visit.     ROS Review of Systems  Constitutional: Positive for fatigue. Negative for chills, fever and unexpected weight change.  Eyes: Positive for visual disturbance.  Respiratory: Negative for shortness of breath.   Cardiovascular: Negative for chest pain.  Gastrointestinal: Positive for constipation and nausea. Negative for abdominal pain and blood in stool.  Endocrine: Positive for polyuria. Negative for polydipsia.  Musculoskeletal: Positive for arthralgias, myalgias, neck pain and neck stiffness. Negative for back pain and joint swelling.  Skin: Negative for rash.  Allergic/Immunologic: Negative for immunocompromised state.  Hematological: Negative for adenopathy. Does not bruise/bleed easily.  Psychiatric/Behavioral: Positive for sleep disturbance. Negative for dysphoric mood and suicidal ideas. The patient is nervous/anxious (financial stressors ).     Objective:  BP 125/85 (BP Location: Left Arm, Patient Position: Sitting, Cuff Size: Small)   Pulse 98   Temp 98.4 F (36.9 C) (Oral)   Ht 5\' 3"  (1.6 m)   Wt 195 lb 3.2 oz (88.5 kg)   SpO2 99%   BMI 34.58 kg/m   BP/Weight 12/04/2016 10/25/2016 4/65/0354  Systolic BP 656 812 751  Diastolic BP 85 700 83  Wt. (Lbs) 195.2 199.6 194.6  BMI 34.58 35.36 34.47   Wt Readings from Last 3 Encounters:  10/25/16 199 lb 9.6 oz (90.5 kg)  09/14/16 194 lb 9.6 oz (88.3 kg)  08/17/16 186 lb 9.6 oz (84.6 kg)    Physical Exam  Constitutional: She is oriented to person, place, and time. She appears well-developed and well-nourished. No distress.  HENT:  Head: Normocephalic and atraumatic.    Eyes: Conjunctivae and EOM are normal. Pupils are equal, round, and reactive to light.  Cardiovascular: Normal rate, regular rhythm, normal heart sounds and intact distal pulses.   Pulmonary/Chest: Effort normal  and breath sounds normal.  Abdominal: Soft. Bowel sounds are normal. She exhibits no distension. There is no tenderness. There is no rebound and no guarding.  Musculoskeletal: She exhibits no edema.  Neurological: She is alert and oriented to person, place, and time.  Skin: Skin is warm and dry. No rash noted.  Psychiatric: She has a normal mood and affect.     Chemistry      Component Value Date/Time   NA 134 (L) 07/26/2016 1349   K 3.5 07/26/2016 1349   CL 100 (L) 07/26/2016 1349   CO2 23 07/26/2016 1349   BUN 16 07/26/2016 1349   CREATININE 0.80 07/26/2016 1349   CREATININE 0.73 08/17/2014 1641      Component Value Date/Time   CALCIUM 9.0 07/26/2016 1349   ALKPHOS 73 07/24/2016 0028   AST  13 (L) 07/24/2016 0028   ALT 11 (L) 07/24/2016 0028   BILITOT 0.7 07/24/2016 0028      Lab Results  Component Value Date   HGBA1C 10.7 10/25/2016   CBG 387 Treated with 20 U of novolog  Repeat CBG 385  UA: 500 glucose, neg ketones  Assessment & Plan:  Jannessa was seen today for hypertension.  Diagnoses and all orders for this visit:  Uncontrolled type 2 diabetes mellitus with diabetic neuropathy, with long-term current use of insulin (HCC) -     Glucose (CBG) -     insulin aspart (novoLOG) injection 20 Units; Inject 0.2 mLs (20 Units total) into the skin once. -     insulin lispro (HUMALOG KWIKPEN) 100 UNIT/ML KiwkPen; Inject 0.15 mLs (15 Units total) into the skin 3 (three) times daily. -     Insulin Glargine (LANTUS SOLOSTAR) 100 UNIT/ML Solostar Pen; Inject 50 Units into the skin daily. -     Insulin Pen Needle (PEN NEEDLES) 32G X 4 MM MISC; 1 each by Does not apply route 4 (four) times daily. -     POCT urinalysis dipstick  Essential hypertension -     hydrochlorothiazide (HYDRODIURIL) 25 MG tablet; Take 1 tablet (25 mg total) by mouth daily. -     eplerenone (INSPRA) 50 MG tablet; Take 1 tablet (50 mg total) by mouth daily.  Chronic cervical pain -     naproxen  (NAPROSYN) 500 MG tablet; Take 1 tablet (500 mg total) by mouth 2 (two) times daily with a meal. -     cyclobenzaprine (FLEXERIL) 10 MG tablet; Take 0.5 tablets (5 mg total) by mouth 3 (three) times daily as needed for muscle spasms. -     Turmeric 500 MG CAPS; Take 500 mg by mouth daily. -     Glucosamine-Chondroitin 750-600 MG TABS; Take 2 tablets by mouth daily.  Insomnia, unspecified type -     zolpidem (AMBIEN) 10 MG tablet; Take 1 tablet (10 mg total) by mouth at bedtime as needed for sleep.  Myalgia -     cyclobenzaprine (FLEXERIL) 10 MG tablet; Take 0.5 tablets (5 mg total) by mouth 3 (three) times daily as needed for muscle spasms.  Chronic bilateral low back pain with right-sided sciatica -     cyclobenzaprine (FLEXERIL) 10 MG tablet; Take 0.5 tablets (5 mg total) by mouth 3 (three) times daily as needed for muscle spasms.   There are no diagnoses linked to this encounter.  No orders of the defined types were placed in this encounter.   Follow-up: Return in about 4 weeks (around 01/01/2017) for diabetes and chronic neck pain .   Boykin Nearing MD

## 2016-12-25 ENCOUNTER — Ambulatory Visit: Payer: Managed Care, Other (non HMO) | Admitting: Family Medicine

## 2017-01-07 ENCOUNTER — Encounter: Payer: Self-pay | Admitting: Family Medicine

## 2017-01-07 ENCOUNTER — Ambulatory Visit: Payer: Managed Care, Other (non HMO) | Attending: Family Medicine | Admitting: Family Medicine

## 2017-01-07 VITALS — BP 174/93 | HR 89 | Temp 98.1°F | Ht 63.0 in | Wt 201.6 lb

## 2017-01-07 DIAGNOSIS — Z79899 Other long term (current) drug therapy: Secondary | ICD-10-CM | POA: Insufficient documentation

## 2017-01-07 DIAGNOSIS — Z794 Long term (current) use of insulin: Secondary | ICD-10-CM | POA: Insufficient documentation

## 2017-01-07 DIAGNOSIS — E1165 Type 2 diabetes mellitus with hyperglycemia: Secondary | ICD-10-CM

## 2017-01-07 DIAGNOSIS — R632 Polyphagia: Secondary | ICD-10-CM | POA: Diagnosis not present

## 2017-01-07 DIAGNOSIS — G8929 Other chronic pain: Secondary | ICD-10-CM | POA: Diagnosis not present

## 2017-01-07 DIAGNOSIS — Z87891 Personal history of nicotine dependence: Secondary | ICD-10-CM | POA: Insufficient documentation

## 2017-01-07 DIAGNOSIS — I10 Essential (primary) hypertension: Secondary | ICD-10-CM | POA: Diagnosis not present

## 2017-01-07 DIAGNOSIS — M545 Low back pain: Secondary | ICD-10-CM

## 2017-01-07 DIAGNOSIS — E1140 Type 2 diabetes mellitus with diabetic neuropathy, unspecified: Secondary | ICD-10-CM

## 2017-01-07 DIAGNOSIS — M542 Cervicalgia: Secondary | ICD-10-CM | POA: Diagnosis not present

## 2017-01-07 DIAGNOSIS — IMO0002 Reserved for concepts with insufficient information to code with codable children: Secondary | ICD-10-CM

## 2017-01-07 LAB — POCT GLYCOSYLATED HEMOGLOBIN (HGB A1C): HEMOGLOBIN A1C: 14

## 2017-01-07 LAB — GLUCOSE, POCT (MANUAL RESULT ENTRY): POC GLUCOSE: 252 mg/dL — AB (ref 70–99)

## 2017-01-07 MED ORDER — LISINOPRIL 40 MG PO TABS: 40.0000 mg | ORAL_TABLET | Freq: Every day | ORAL | 5 refills | Status: DC

## 2017-01-07 NOTE — Assessment & Plan Note (Signed)
  For chronic pain in neck and back Take tumeric and glucosamine chondroitin daily Low impact physical activity daily

## 2017-01-07 NOTE — Progress Notes (Signed)
Subjective:  Patient ID: Nancy [name], female    DOB: 10/07/1961  Age: 56 y.o. MRN: [id number]  CC: Diabetes; Hypertension; and Pain   HPI Nancy H Moreheadhas diabetes, HTN, insomnia, obesity she  presents for    1. Chronic back pain: started in 2013. No previous injury. Has previous MRI. Over last 6-7 months having worsening pain on the R side. No loss or urine or stool. No groin numbness. She has weakness in her R leg. No fever or chills. Current pain is 6/10.   MRI in low back done 04/16/2003:  IMPRESSION MILD FACET DEGENERATIVE CHANGES AT L5-S1.  NEGATIVE FOR EVIDENCE OF DISK HERNIATION. FIVE VIEW LUMBAR SPINE PATIENT WITH FIVE NONRIBBEARING LUMBAR TYPE VERTEBRAE.  THE ALIGNMENT IS ANATOMIC.  DISK SPACE IS WELL PRESERVED.  THE OBLIQUE VIEWS SHOW MILD FACET DEGENERATIVE CHANGES AT L5-S1. IMPRESSION MILD FACET DEGENERATIVE CHANGES, LOWER LUMBAR SPINE.  DG lumbar spine 10/29/2016:  negative    2. chronic neck pain: since 2013. Has knot on R side behind R ear. Also getting pain on L side for past 4 weeks. Also some anterior neck cramps and posterior neck pain. Posterior neck pain has been off and on for the past 6 months. Current pain is 7/10. Flexeril at night has helped with pain.   DG cervical spine 10/29/2016  IMPRESSION: Cervical spondylosis C3 through C7 of a moderate degree Cervical ribs bilaterally left greater than right  3. Diabetes: gaining weight. No exercise. Drinks 1 regular soda per day. She helps her brother take care of their 69 yo mother and goes from work to her brothers house daily. She has poor night vision. She admits to overeating.   4. HTN: she has not taking BP medicine today. She reports taking all of her medications at night. She takes hydralazine once daily at bedtime.   Social History  Substance Use Topics  . Smoking status: Former Smoker    Packs/day: 0.25    Years: 0.50    Types: Cigarettes  . Smokeless tobacco: Never Used  .  Alcohol use Yes     Comment: occasion     Outpatient Medications Prior to Visit  Medication Sig Dispense Refill  . acetaminophen (TYLENOL) 500 MG tablet Take 1,000 mg by mouth every 6 (six) hours as needed for mild pain.    Marland Kitchen atorvastatin (LIPITOR) 40 MG tablet Take 1 tablet (40 mg total) by mouth daily. 90 tablet 3  . cyclobenzaprine (FLEXERIL) 10 MG tablet Take 0.5 tablets (5 mg total) by mouth 3 (three) times daily as needed for muscle spasms. 60 tablet 5  . diclofenac sodium (VOLTAREN) 1 % GEL Apply 4 g topically 4 (four) times daily. 100 g 3  . Elastic Bandages & Supports (MEDICAL COMPRESSION SOCKS) MISC 1 each by Does not apply route daily. 25-30 mm Hg pressure (Patient not taking: Reported on 12/04/2016) 2 each 0  . eplerenone (INSPRA) 50 MG tablet Take 1 tablet (50 mg total) by mouth daily. 30 tablet 5  . famotidine (PEPCID) 20 MG tablet Take 1 tablet (20 mg total) by mouth 2 (two) times daily. 30 tablet 0  . Glucosamine-Chondroitin 750-600 MG TABS Take 2 tablets by mouth daily.  0  . hydrALAZINE (APRESOLINE) 100 MG tablet Take 1 tablet (100 mg total) by mouth every 8 (eight) hours. 90 tablet 5  . hydrochlorothiazide (HYDRODIURIL) 25 MG tablet Take 1 tablet (25 mg total) by mouth daily. 30 tablet 5  . hydrocortisone valerate ointment (WESTCORT) 0.2 % Apply  1 application topically 2 (two) times daily. 45 g 0  . insulin aspart (NOVOLOG FLEXPEN) 100 UNIT/ML FlexPen Inject 10 Units into the skin 3 (three) times daily with meals. 15 mL 11  . Insulin Glargine (LANTUS SOLOSTAR) 100 UNIT/ML Solostar Pen Inject 50 Units into the skin daily. 5 pen 5  . insulin lispro (HUMALOG KWIKPEN) 100 UNIT/ML KiwkPen Inject 0.15 mLs (15 Units total) into the skin 3 (three) times daily. 15 mL 11  . Insulin Pen Needle (PEN NEEDLES) 32G X 4 MM MISC 1 each by Does not apply route 4 (four) times daily. 120 each 11  . ketoconazole (NIZORAL) 2 % cream Apply 1 application topically daily. To rash between toes 15 g 0    . lisinopril (PRINIVIL,ZESTRIL) 20 MG tablet Take 1 tablet (20 mg total) by mouth daily. 30 tablet 5  . metoCLOPramide (REGLAN) 10 MG tablet Take 1 tablet (10 mg total) by mouth every 6 (six) hours as needed for nausea. 30 tablet 0  . naproxen (NAPROSYN) 500 MG tablet Take 1 tablet (500 mg total) by mouth 2 (two) times daily with a meal. 30 tablet 0  . ondansetron (ZOFRAN) 4 MG tablet Take 1 tablet (4 mg total) by mouth every 8 (eight) hours as needed for nausea or vomiting. 20 tablet 0  . polyethylene glycol powder (GLYCOLAX/MIRALAX) powder Take 17 g by mouth daily. (Patient not taking: Reported on 12/04/2016) 3350 g 1  . Turmeric 500 MG CAPS Take 500 mg by mouth daily.    Marland Kitchen zolpidem (AMBIEN) 10 MG tablet Take 1 tablet (10 mg total) by mouth at bedtime as needed for sleep. 30 tablet 5   No facility-administered medications prior to visit.     ROS Review of Systems  Constitutional: Positive for fatigue. Negative for chills, fever and unexpected weight change.  Eyes: Positive for visual disturbance.  Respiratory: Negative for shortness of breath.   Cardiovascular: Negative for chest pain.  Gastrointestinal: Positive for constipation and nausea. Negative for abdominal pain and blood in stool.  Endocrine: Positive for polyuria. Negative for polydipsia.  Musculoskeletal: Positive for arthralgias, myalgias, neck pain and neck stiffness. Negative for back pain and joint swelling.  Skin: Negative for rash.  Allergic/Immunologic: Negative for immunocompromised state.  Hematological: Negative for adenopathy. Does not bruise/bleed easily.  Psychiatric/Behavioral: Positive for sleep disturbance. Negative for dysphoric mood and suicidal ideas. The patient is nervous/anxious (financial stressors ).     Objective:  BP (!) 174/93 (BP Location: Left Arm, Patient Position: Sitting, Cuff Size: Small)   Pulse 89   Temp 98.1 F (36.7 C) (Oral)   Ht 5\' 3"  (1.6 m)   Wt 201 lb 9.6 oz (91.4 kg)   SpO2 99%    BMI 35.71 kg/m   BP/Weight 01/07/2017 12/04/2016 77/03/1286  Systolic BP 867 672 094  Diastolic BP 93 85 709  Wt. (Lbs) 201.6 195.2 199.6  BMI 35.71 34.58 35.36   Wt Readings from Last 3 Encounters:  01/07/17 201 lb 9.6 oz (91.4 kg)  12/04/16 195 lb 3.2 oz (88.5 kg)  10/25/16 199 lb 9.6 oz (90.5 kg)    Physical Exam  Constitutional: She is oriented to person, place, and time. She appears well-developed and well-nourished. No distress.  HENT:  Head: Normocephalic and atraumatic.    Eyes: Conjunctivae and EOM are normal. Pupils are equal, round, and reactive to light.  Cardiovascular: Normal rate, regular rhythm, normal heart sounds and intact distal pulses.   Pulmonary/Chest: Effort normal and breath sounds normal.  Abdominal: Soft. Bowel sounds are normal. She exhibits no distension. There is no tenderness. There is no rebound and no guarding.  Musculoskeletal: She exhibits no edema.  Neurological: She is alert and oriented to person, place, and time.  Skin: Skin is warm and dry. No rash noted.  Psychiatric: She has a normal mood and affect.     Chemistry      Component Value Date/Time   NA 134 (L) 07/26/2016 1349   K 3.5 07/26/2016 1349   CL 100 (L) 07/26/2016 1349   CO2 23 07/26/2016 1349   BUN 16 07/26/2016 1349   CREATININE 0.80 07/26/2016 1349   CREATININE 0.73 08/17/2014 1641      Component Value Date/Time   CALCIUM 9.0 07/26/2016 1349   ALKPHOS 73 07/24/2016 0028   AST 13 (L) 07/24/2016 0028   ALT 11 (L) 07/24/2016 0028   BILITOT 0.7 07/24/2016 0028      Lab Results  Component Value Date   HGBA1C 10.7 10/25/2016   Lab Results  Component Value Date   HGBA1C 14.0 01/07/2017    CBG 252  Assessment & Plan:  Elyanna was seen today for diabetes, hypertension and pain.  Diagnoses and all orders for this visit:  Uncontrolled type 2 diabetes mellitus with diabetic neuropathy, with long-term current use of insulin (HCC) -     POCT glucose (manual  entry) -     POCT glycosylated hemoglobin (Hb A1C)  Essential hypertension -     lisinopril (PRINIVIL,ZESTRIL) 40 MG tablet; Take 1 tablet (40 mg total) by mouth daily.  Chronic cervical pain  Chronic right-sided low back pain without sciatica   There are no diagnoses linked to this encounter.  No orders of the defined types were placed in this encounter.   Follow-up: Return in about 4 weeks (around 02/04/2017) for HTN and diabetes .   Boykin Nearing MD

## 2017-01-07 NOTE — Assessment & Plan Note (Addendum)
For diabetes: uncontrolled  Stop drinking all soda Start exercise for 30-45 minutes 3-4 times per week Goal is weight loss or 10 lbs to start with Goal is muscle increase Goal is stress decreased  Consider adding GLP-1 to regimen   For chronic pain in neck and back Take tumeric and glucosamine chondroitin daily Low impact physical activity daily    Diabetes blood sugar goals  Fasting (in AM before breakfast, 8 hrs of no eating or drinking (except water or unsweetened coffee or tea): 90-110 2 hrs after meals: < 160,   No low sugars: nothing < 70

## 2017-01-07 NOTE — Assessment & Plan Note (Signed)
For HTN uncontrolled  take eplerenone and hydrochlorothiazide in the morning Increase lisinopril to 40 mg daily Try to take hydralazine 3 times a day for BP control, if you feel tired you

## 2017-01-07 NOTE — Patient Instructions (Addendum)
Nancy [name] was seen today for diabetes, hypertension and pain.  Diagnoses and all orders for this visit:  Uncontrolled type 2 diabetes mellitus with diabetic neuropathy, with long-term current use of insulin (HCC) -     POCT glucose (manual entry) -     POCT glycosylated hemoglobin (Hb A1C)  Essential hypertension -     lisinopril (PRINIVIL,ZESTRIL) 40 MG tablet; Take 1 tablet (40 mg total) by mouth daily.   For HTN  take eplerenone and hydrochlorothiazide in the morning Increase lisinopril to 40 mg daily Try to take hydralazine 3 times a day for BP control, if you feel tired you   For diabetes: Stop drinking all soda Start exercise for 30-45 minutes 3-4 times per week Goal is weight loss or 10 lbs to start with Goal is muscle increase Goal is stress decreased  For chronic pain in neck and back Take tumeric and glucosamine chondroitin daily Low impact physical activity daily    Diabetes blood sugar goals  Fasting (in AM before breakfast, 8 hrs of no eating or drinking (except water or unsweetened coffee or tea): 90-110 2 hrs after meals: < 160,   No low sugars: nothing < 70   F/u in 3-4 weeks for HTN and diabetes   Dr. Adrian Blackwater

## 2017-05-01 ENCOUNTER — Encounter: Payer: Self-pay | Admitting: Family Medicine

## 2017-09-05 ENCOUNTER — Telehealth: Payer: Self-pay | Admitting: Family Medicine

## 2017-09-05 NOTE — Telephone Encounter (Signed)
Please schedule patient for office visit Thank you.

## 2017-09-05 NOTE — Telephone Encounter (Signed)
Pt called requesting medication refill on insulin aspart (NOVOLOG FLEXPEN) 100 UNIT/ML FlexPen  pt is on waitlist to est care with new PCP.  Please f/up

## 2017-09-06 NOTE — Telephone Encounter (Signed)
Patient needs an OV for refill on Insulin

## 2017-10-29 ENCOUNTER — Ambulatory Visit: Payer: Self-pay | Attending: Family Medicine | Admitting: Family Medicine

## 2017-10-29 ENCOUNTER — Other Ambulatory Visit: Payer: Self-pay

## 2017-10-29 ENCOUNTER — Encounter: Payer: Self-pay | Admitting: Family Medicine

## 2017-10-29 ENCOUNTER — Ambulatory Visit: Payer: Self-pay | Attending: Family Medicine | Admitting: Licensed Clinical Social Worker

## 2017-10-29 VITALS — BP 163/89 | HR 91 | Resp 18 | Ht 63.0 in | Wt 206.6 lb

## 2017-10-29 DIAGNOSIS — Z8669 Personal history of other diseases of the nervous system and sense organs: Secondary | ICD-10-CM

## 2017-10-29 DIAGNOSIS — H3530 Unspecified macular degeneration: Secondary | ICD-10-CM | POA: Insufficient documentation

## 2017-10-29 DIAGNOSIS — Z760 Encounter for issue of repeat prescription: Secondary | ICD-10-CM | POA: Insufficient documentation

## 2017-10-29 DIAGNOSIS — F418 Other specified anxiety disorders: Secondary | ICD-10-CM | POA: Insufficient documentation

## 2017-10-29 DIAGNOSIS — Z1231 Encounter for screening mammogram for malignant neoplasm of breast: Secondary | ICD-10-CM

## 2017-10-29 DIAGNOSIS — R0609 Other forms of dyspnea: Secondary | ICD-10-CM | POA: Insufficient documentation

## 2017-10-29 DIAGNOSIS — E119 Type 2 diabetes mellitus without complications: Secondary | ICD-10-CM | POA: Insufficient documentation

## 2017-10-29 DIAGNOSIS — F4323 Adjustment disorder with mixed anxiety and depressed mood: Secondary | ICD-10-CM

## 2017-10-29 DIAGNOSIS — E1165 Type 2 diabetes mellitus with hyperglycemia: Secondary | ICD-10-CM | POA: Insufficient documentation

## 2017-10-29 DIAGNOSIS — R0600 Dyspnea, unspecified: Secondary | ICD-10-CM | POA: Insufficient documentation

## 2017-10-29 DIAGNOSIS — E1122 Type 2 diabetes mellitus with diabetic chronic kidney disease: Secondary | ICD-10-CM | POA: Insufficient documentation

## 2017-10-29 DIAGNOSIS — Z1239 Encounter for other screening for malignant neoplasm of breast: Secondary | ICD-10-CM

## 2017-10-29 DIAGNOSIS — E1140 Type 2 diabetes mellitus with diabetic neuropathy, unspecified: Secondary | ICD-10-CM | POA: Insufficient documentation

## 2017-10-29 DIAGNOSIS — Z794 Long term (current) use of insulin: Secondary | ICD-10-CM | POA: Insufficient documentation

## 2017-10-29 DIAGNOSIS — IMO0002 Reserved for concepts with insufficient information to code with codable children: Secondary | ICD-10-CM

## 2017-10-29 DIAGNOSIS — B353 Tinea pedis: Secondary | ICD-10-CM | POA: Insufficient documentation

## 2017-10-29 DIAGNOSIS — I10 Essential (primary) hypertension: Secondary | ICD-10-CM | POA: Insufficient documentation

## 2017-10-29 LAB — POCT UA - MICROALBUMIN
CREATININE, POC: 100 mg/dL
Microalbumin Ur, POC: 80 mg/L

## 2017-10-29 LAB — POCT GLYCOSYLATED HEMOGLOBIN (HGB A1C): Hemoglobin A1C: 13.9

## 2017-10-29 LAB — GLUCOSE, POCT (MANUAL RESULT ENTRY): POC Glucose: 345 mg/dl — AB (ref 70–99)

## 2017-10-29 MED ORDER — HYDRALAZINE HCL 100 MG PO TABS: 100.0000 mg | ORAL_TABLET | Freq: Three times a day (TID) | ORAL | 2 refills | Status: DC

## 2017-10-29 MED ORDER — EPLERENONE 50 MG PO TABS: 50.0000 mg | ORAL_TABLET | Freq: Every day | ORAL | 3 refills | Status: DC

## 2017-10-29 MED ORDER — INSULIN GLARGINE 100 UNIT/ML SOLOSTAR PEN: 50.0000 [IU] | PEN_INJECTOR | Freq: Every day | SUBCUTANEOUS | 5 refills | Status: DC

## 2017-10-29 MED ORDER — OCUVITE ADULT 50+ PO CAPS: 1.0000 | ORAL_CAPSULE | Freq: Every day | ORAL | 11 refills | Status: DC

## 2017-10-29 MED ORDER — LISINOPRIL 40 MG PO TABS: 40.0000 mg | ORAL_TABLET | Freq: Every day | ORAL | 2 refills | Status: DC

## 2017-10-29 MED ORDER — BUPROPION HCL ER (SR) 150 MG PO TB12: 150.0000 mg | ORAL_TABLET | Freq: Two times a day (BID) | ORAL | 2 refills | Status: DC

## 2017-10-29 MED ORDER — INSULIN ASPART 100 UNIT/ML ~~LOC~~ SOLN
10.0000 [IU] | Freq: Once | SUBCUTANEOUS | Status: AC
  Administered 2017-10-29: 10 [IU] via SUBCUTANEOUS

## 2017-10-29 MED ORDER — HYDROCHLOROTHIAZIDE 25 MG PO TABS: 25.0000 mg | ORAL_TABLET | Freq: Every day | ORAL | 2 refills | Status: DC

## 2017-10-29 MED ORDER — GABAPENTIN 300 MG PO CAPS: 300.0000 mg | ORAL_CAPSULE | Freq: Every day | ORAL | 2 refills | Status: DC

## 2017-10-29 MED ORDER — ATORVASTATIN CALCIUM 40 MG PO TABS: 40.0000 mg | ORAL_TABLET | Freq: Every day | ORAL | 1 refills | Status: DC

## 2017-10-29 MED ORDER — KETOCONAZOLE 2 % EX CREA: 1.0000 "application " | TOPICAL_CREAM | Freq: Every day | CUTANEOUS | 0 refills | Status: DC

## 2017-10-29 MED ORDER — INSULIN ASPART 100 UNIT/ML FLEXPEN: PEN_INJECTOR | SUBCUTANEOUS | 11 refills | Status: DC

## 2017-10-29 MED ORDER — FAMOTIDINE 20 MG PO TABS: 20.0000 mg | ORAL_TABLET | Freq: Two times a day (BID) | ORAL | 11 refills | Status: DC

## 2017-10-29 MED FILL — hydrALAZINE HCL 100 MG TABS: 100 | 30 days supply | Qty: 90 | Fill #0

## 2017-10-29 MED FILL — EPLERENONE 50 MG TABS: 50 | 30 days supply | Qty: 30 | Fill #0

## 2017-10-29 MED FILL — FAMOTIDINE 20 MG TABLET: 20 | 15 days supply | Qty: 30 | Fill #0

## 2017-10-29 MED FILL — ?BUPROPION HCL SR 150MG TAB: 150 | 30 days supply | Qty: 60 | Fill #0

## 2017-10-29 MED FILL — !LANTUS SOLOSTAR 100UNITS/M: 100 | 30 days supply | Qty: 15 | Fill #0

## 2017-10-29 MED FILL — LISINOPRIL 40 MG TABS: 40 | 30 days supply | Qty: 30 | Fill #0

## 2017-10-29 MED FILL — GABAPENTIN 300 MG CAPSULE: 300 | 30 days supply | Qty: 30 | Fill #0

## 2017-10-29 MED FILL — HYDROCHLOROTHIAZIDE 25 MG T: 25 | 30 days supply | Qty: 30 | Fill #0

## 2017-10-29 MED FILL — KETOCONAZOLE 2% CREAM: 2 | 14 days supply | Qty: 15 | Fill #0

## 2017-10-29 MED FILL — ?ATORVASTATIN 40MG TABLET: 40 | 30 days supply | Qty: 30 | Fill #0

## 2017-10-29 NOTE — BH Specialist Note (Signed)
Integrated Behavioral Health Initial Visit  MRN: [id number] Name: Nancy [name]  Number of [place] Clinician visits:: 1/6 Session Start time: 12:10 PM  Session End time: 12:55 PM Total time: 45 minutes  Type of Service: [place] Interpretor:No. Interpretor Name and Language: N/A   Warm Hand Off Completed.       SUBJECTIVE: Nancy [name] is a 56 y.o. female accompanied by self Patient was referred by FNP [place] for depression and anxiety. Patient reports the following symptoms/concerns: overwhelming feelings of sadness and worry, difficulty sleeping, low motivation, decreased concentration, and irritability, and hx of panic attacks Duration of problem: Beginning of 2018; Severity of problem: severe  OBJECTIVE: Mood: Anxious and Depressed and Affect: Tearful Risk of harm to self or others: No plan to harm self or others  LIFE CONTEXT: Family and Social: Pt is currently residing with adult daughter. She is receiving emotional support from child and siblings.  School/Work: Pt recently loss her job of over twenty years due to issues with management. Pt has savings that will assist in paying her bills for three months. Self-Care: Pt uses positive affirmations and watch inspirational videos on YouTube and Facebook Life Changes: In this year, pt lost her long-time job, was forced to sell her house, is grieving the loss of her mother, and is currently in the process of separating from spouse  GOALS ADDRESSED: Patient will: 1. Reduce symptoms of: anxiety and depression 2. Increase knowledge and/or ability of: coping skills  3. Demonstrate ability to: Increase adequate support systems for patient/family and Begin healthy grieving over loss  INTERVENTIONS: Interventions utilized: Solution-Focused Strategies, Supportive Counseling, Psychoeducation and/or Health Education and Link to [employer]  Standardized  Assessments completed: GAD-7 and PHQ 2&9 with C-SSRS  ASSESSMENT: Patient currently experiencing depression and anxiety triggered by the loss her job, house, the death of her mother, and current separation with spouse within a year. She reports overwhelming feelings of sadness and worry, difficulty sleeping, low motivation, decreased concentration, and irritability, and hx of panic attacks. Pt receives support from family and denies SI/HI/AVH.   Patient may benefit from psychoeducation, psychotherapy, and medication management. [place] educated pt on correlation between one's physical and mental health, in addition, to how stress can negatively impact health. LCSWA discussed therapeutic interventions to decrease symptoms. Pt has agreed to initiate medication management and participate in grief counseling. LCSWA provided resources for crisis intervention and grief support. Pt was encouraged to schedule an appointment with Financial Counseling to assist with financial strain.  PLAN: 1. Follow up with behavioral health clinician on : Pt was encouraged to contact LCSWA if symptoms worsen or fail to improve to schedule behavioral appointments at [facility] 2. Behavioral recommendations: LCSWA recommends that pt apply healthy coping skills discussed, comply with medication management, initiate grief therapy, and utilize provided resources. Pt is encouraged to schedule follow up appointment with LCSWA 3. Referral(s): [facility] (In Clinic) and [place] (LME/Outside Clinic) 4. "From scale of 1-10, how likely are you to follow plan?": 9/10  Rebekah Chesterfield, LCSW 10/30/17 11:25 AM

## 2017-10-29 NOTE — Patient Instructions (Addendum)
Start checking blood sugars three times a day before meals and daily at bedtime. Bring list of blood sugar readings or glucometer to next office visit. Start to gradually introduce exercise into your regimen. You will be called with your labs results.  Type 2 Diabetes Mellitus, Self Care, Adult When you have type 2 diabetes (type 2 diabetes mellitus), you must keep your blood sugar (glucose) under control. You can do this with:  Nutrition.  Exercise.  Lifestyle changes.  Medicines or insulin, if needed.  Support from your doctors and others.  How do I manage my blood sugar?  Check your blood sugar level every day, as often as told.  Call your doctor if your blood sugar is above your goal numbers for 2 tests in a row.  Have your A1c (hemoglobin A1c) level checked at least two times a year. Have it checked more often if your doctor tells you to. Your doctor will set treatment goals for you. Generally, you should have these blood sugar levels:  Before meals (preprandial): 80-130 mg/dL (4.4-7.2 mmol/L).  After meals (postprandial): lower than 180 mg/dL (10 mmol/L).  A1c level: less than 7%.  What do I need to know about high blood sugar? High blood sugar is called hyperglycemia. Know the signs of high blood sugar. Signs may include:  Feeling: ? Thirsty. ? Hungry. ? Very tired.  Needing to pee (urinate) more than usual.  Blurry vision.  What do I need to know about low blood sugar? Low blood sugar is called hypoglycemia. This is when blood sugar is at or below 70 mg/dL (3.9 mmol/L). Symptoms may include:  Feeling: ? Hungry. ? Worried or nervous (anxious). ? Sweaty and clammy. ? Confused. ? Dizzy. ? Sleepy. ? Sick to your stomach (nauseous).  Having: ? A fast heartbeat (palpitations). ? A headache. ? A change in your vision. ? Jerky movements that you cannot control (seizure). ? Nightmares. ? Tingling or no feeling (numbness) around the mouth, lips, or  tongue.  Having trouble with: ? Talking. ? Paying attention (concentrating). ? Moving (coordination). ? Sleeping.  Shaking.  Passing out (fainting).  Getting upset easily (irritability).  Treating low blood sugar  To treat low blood sugar, eat or drink something sugary right away. If you can think clearly and swallow safely, follow the 15:15 rule:  Take 15 grams of a fast-acting carb (carbohydrate). Some fast-acting carbs are: ? 1 tube of glucose gel. ? 3 sugar tablets (glucose pills). ? 6-8 pieces of hard candy. ? 4 oz (120 mL) of fruit juice. ? 4 oz (120 mL) regular (not diet) soda.  Check your blood sugar 15 minutes after you take the carb.  If your blood sugar is still at or below 70 mg/dL (3.9 mmol/L), take 15 grams of a carb again.  If your blood sugar does not go above 70 mg/dL (3.9 mmol/L) after 3 tries, get help right away.  After your blood sugar goes back to normal, eat a meal or a snack within 1 hour.  Treating very low blood sugar If your blood sugar is at or below 54 mg/dL (3 mmol/L), you have very low blood sugar (severe hypoglycemia). This is an emergency. Do not wait to see if the symptoms will go away. Get medical help right away. Call your local emergency services (911 in the [place] Do not drive yourself to the hospital. If you have very low blood sugar and you cannot eat or drink, you may need a glucagon shot (injection). A  family member or friend should learn how to check your blood sugar and how to give you a glucagon shot. Ask your doctor if you need to have a glucagon shot kit at home. What else is important to manage my diabetes? Medicine Follow these instructions about insulin and diabetes medicines:  Take them as told by your doctor.  Adjust them as told by your doctor.  Do not run out of them.  Having diabetes can raise your risk for other long-term conditions. These include heart or kidney disease. Your doctor may prescribe medicines to help  prevent problems from diabetes. Food   Make healthy food choices. These include: ? Chicken, fish, egg whites, and beans. ? Oats, whole wheat, bulgur, brown rice, quinoa, and millet. ? Fresh fruits and vegetables. ? Low-fat dairy products. ? Nuts, avocado, olive oil, and canola oil.  Make a food plan with a specialist (dietitian).  Follow instructions from your doctor about what you cannot eat or drink.  Drink enough fluid to keep your pee (urine) clear or pale yellow.  Eat healthy snacks between healthy meals.  Keep track of carbs that you eat. Read food labels. Learn food serving sizes.  Follow your sick day plan when you cannot eat or drink normally. Make this plan with your doctor so it is ready to use. Activity  Exercise at least 3 times a week.  Do not go more than 2 days without exercising.  Talk with your doctor before you start a new exercise. Your doctor may need to adjust your insulin, medicines, or food. Lifestyle   Do not use any tobacco products. These include cigarettes, chewing tobacco, and e-cigarettes.If you need help quitting, ask your doctor.  Ask your doctor how much alcohol is safe for you.  Learn to deal with stress. If you need help with this, ask your doctor. Body care  Stay up to date with your shots (immunizations).  Have your eyes and feet checked by a doctor as often as told.  Check your skin and feet every day. Check for cuts, bruises, redness, blisters, or sores.  Brush your teeth and gums two times a day.  Floss at least one time a day.  Go to the dentist least one time every 6 months.  Stay at a healthy weight. General instructions   Take over-the-counter and prescription medicines only as told by your doctor.  Share your diabetes care plan with: ? Your work or school. ? People you live with.  Check your pee (urine) for ketones: ? When you are sick. ? As told by your doctor.  Carry a card or wear jewelry that says that  you have diabetes.  Ask your doctor: ? Do I need to meet with a diabetes educator? ? Where can I find a support group for people with diabetes?  Keep all follow-up visits as told by your doctor. This is important. Where to find more information: To learn more about diabetes, visit:  American Diabetes Association: www.diabetes.org  American Association of Diabetes Educators: www.diabeteseducator.org/patient-resources  This information is not intended to replace advice given to you by your health care provider. Make sure you discuss any questions you have with your health care provider. Document Released: 03/26/2016 Document Revised: 05/10/2016 Document Reviewed: 01/06/2016 Elsevier Interactive Patient Education  2018 [place] Athlete's foot (tinea pedis) is a fungal infection of the skin on the feet. It often occurs on the skin that is between or underneath the toes. It can also occur  on the soles of the feet. The infection can spread from person to person (is contagious). Follow these instructions at home:  Apply or take over-the-counter and prescription medicines only as told by your doctor.  Keep all follow-up visits as told by your doctor. This is important.  Do not scratch your feet.  Keep your feet dry: ? Wear cotton or wool socks. Change your socks every day or if they become wet. ? Wear shoes that allow air to move around, such as sandals or canvas tennis shoes.  Wash and dry your feet: ? Every day or as told by your doctor. ? After exercising. ? Including the area between your toes.  Wear sandals in wet areas, such as locker rooms and shared showers.  Do not share any of these items: ? Towels. ? Nail clippers. ? Other personal items that touch your feet.  If you have diabetes, keep your blood sugar under control. Contact a doctor if:  You have a fever.  You have swelling, soreness, warmth, or redness in your foot.  You are not getting  better with treatment.  Your symptoms get worse.  You have new symptoms. This information is not intended to replace advice given to you by your health care provider. Make sure you discuss any questions you have with your health care provider. Document Released: 05/21/2008 Document Revised: 05/10/2016 Document Reviewed: 06/06/2015 Elsevier Interactive Patient Education  2018 [employer].

## 2017-10-29 NOTE — Progress Notes (Signed)
Subjective:  Patient ID: Nancy [name], female    DOB: 1961/12/16  Age: 56 y.o. MRN: [id number]  CC: Diabetes   HPI Nancy [name] presents to re-establish care for DM/HTN follow up. Diabetes Mellitus: Patient presents for follow up of diabetes. Symptoms: none. She reports her last ophthalmology visit was Dec. 2017. She report history of macular degeneration. She is taking Ocuvite daily. Patient denies foot ulcerations, hyperglycemia, nausea, paresthesia of the feet and vomitting.  Evaluation to date has been included: fasting blood sugar, fasting lipid panel and hemoglobin A1C.  Home sugars: patient does not check sugars. She reports being without her medications for 3 months. Hypertension: Patient here for follow-up of elevated blood pressure. She is not exercising and is not adherent to low salt diet. She does not check BP home. Cardiac symptoms none. She does reports SOB with activities like walking. She denies any chronic cough, wheezing, or hemoptysis. Patient denies chest pain, chest pressure/discomfort, claudication, exertional chest pressure/discomfort, lower extremity edema, near-syncope, palpitations and syncope.  Cardiovascular risk factors: diabetes mellitus, dyslipidemia, hypertension, obesity (BMI >= 30 kg/m2) and sedentary lifestyle. Use of agents associated with hypertension: none. History of target organ damage: none.  Depressed mood with anxiety: Patient complains of depression mood and anxious mood. She complains of anhedonia, depressed mood, insomnia and irritability. Onset was approximately 8 months ago, gradually worsening since that time.  She denies current suicidal and homicidal plan or intent. .Possible organic causes contributing are: none.  Risk factors: negative life event : job loss in March, home was foreclosed on, mother died a few months after, history of panic attacks. Previous treatment includes none . She is agreeable to speaking with LCSW at this time. She is  agreeable to medication at this time.    Outpatient Medications Prior to Visit  Medication Sig Dispense Refill  . acetaminophen (TYLENOL) 500 MG tablet Take 1,000 mg by mouth every 6 (six) hours as needed for mild pain.    . cyclobenzaprine (FLEXERIL) 10 MG tablet Take 0.5 tablets (5 mg total) by mouth 3 (three) times daily as needed for muscle spasms. 60 tablet 5  . diclofenac sodium (VOLTAREN) 1 % GEL Apply 4 g topically 4 (four) times daily. 100 g 3  . Elastic Bandages & Supports (MEDICAL COMPRESSION SOCKS) MISC 1 each by Does not apply route daily. 25-30 mm Hg pressure (Patient not taking: Reported on 12/04/2016) 2 each 0  . Glucosamine-Chondroitin 750-600 MG TABS Take 2 tablets by mouth daily.  0  . hydrocortisone valerate ointment (WESTCORT) 0.2 % Apply 1 application topically 2 (two) times daily. 45 g 0  . Insulin Pen Needle (PEN NEEDLES) 32G X 4 MM MISC 1 each by Does not apply route 4 (four) times daily. 120 each 11  . metoCLOPramide (REGLAN) 10 MG tablet Take 1 tablet (10 mg total) by mouth every 6 (six) hours as needed for nausea. 30 tablet 0  . naproxen (NAPROSYN) 500 MG tablet Take 1 tablet (500 mg total) by mouth 2 (two) times daily with a meal. 30 tablet 0  . ondansetron (ZOFRAN) 4 MG tablet Take 1 tablet (4 mg total) by mouth every 8 (eight) hours as needed for nausea or vomiting. 20 tablet 0  . polyethylene glycol powder (GLYCOLAX/MIRALAX) powder Take 17 g by mouth daily. (Patient not taking: Reported on 12/04/2016) 3350 g 1  . Turmeric 500 MG CAPS Take 500 mg by mouth daily. (Patient not taking: Reported on 01/07/2017)    . zolpidem (AMBIEN)  10 MG tablet Take 1 tablet (10 mg total) by mouth at bedtime as needed for sleep. 30 tablet 5  . atorvastatin (LIPITOR) 40 MG tablet Take 1 tablet (40 mg total) by mouth daily. 90 tablet 3  . eplerenone (INSPRA) 50 MG tablet Take 1 tablet (50 mg total) by mouth daily. 30 tablet 5  . famotidine (PEPCID) 20 MG tablet Take 1 tablet (20 mg  total) by mouth 2 (two) times daily. 30 tablet 0  . hydrALAZINE (APRESOLINE) 100 MG tablet Take 1 tablet (100 mg total) by mouth every 8 (eight) hours. 90 tablet 5  . hydrochlorothiazide (HYDRODIURIL) 25 MG tablet Take 1 tablet (25 mg total) by mouth daily. 30 tablet 5  . insulin aspart (NOVOLOG FLEXPEN) 100 UNIT/ML FlexPen Inject 10 Units into the skin 3 (three) times daily with meals. 15 mL 11  . Insulin Glargine (LANTUS SOLOSTAR) 100 UNIT/ML Solostar Pen Inject 50 Units into the skin daily. 5 pen 5  . insulin lispro (HUMALOG KWIKPEN) 100 UNIT/ML KiwkPen Inject 0.15 mLs (15 Units total) into the skin 3 (three) times daily. 15 mL 11  . ketoconazole (NIZORAL) 2 % cream Apply 1 application topically daily. To rash between toes 15 g 0  . lisinopril (PRINIVIL,ZESTRIL) 40 MG tablet Take 1 tablet (40 mg total) by mouth daily. 30 tablet 5   No facility-administered medications prior to visit.     ROS Review of Systems  Constitutional: Negative.   Eyes: Visual disturbance: history of macular degeneration.  Respiratory: Positive for shortness of breath (with activity).   Cardiovascular: Negative.   Gastrointestinal: Negative.   Endocrine: Negative.   Skin: Negative.   Psychiatric/Behavioral: Positive for dysphoric mood. The patient is nervous/anxious.        Objective:  BP (!) 163/89 (BP Location: Left Arm, Patient Position: Sitting, Cuff Size: Normal)   Pulse 91   Resp 18   Ht 5\' 3"  (1.6 m)   Wt 206 lb 9.6 oz (93.7 kg)   SpO2 100%   BMI 36.60 kg/m   BP/Weight 10/29/2017 01/07/2017 87/68/1157  Systolic BP 262 035 597  Diastolic BP 89 93 85  Wt. (Lbs) 206.6 201.6 195.2  BMI 36.6 35.71 34.58     Physical Exam  Constitutional: She appears well-developed and well-nourished.  Eyes: Conjunctivae are normal. Pupils are equal, round, and reactive to light.  Neck: No JVD present.  Cardiovascular: Normal rate, regular rhythm, normal heart sounds and intact distal pulses.    Pulmonary/Chest: Effort normal and breath sounds normal. She has no wheezes.  Abdominal: Soft. Bowel sounds are normal. There is no tenderness.  Skin: Skin is warm and dry.  Psychiatric: She is slowed. She exhibits a depressed mood. She expresses no homicidal and no suicidal ideation. She expresses no suicidal plans and no homicidal plans.  Nursing note and vitals reviewed.   Diabetic Foot Exam - Simple   Simple Foot Form Diabetic Foot exam was performed with the following findings:  Yes 10/29/2017 11:41 AM  Visual Inspection No deformities, no ulcerations, no other skin breakdown bilaterally:  Yes See comments:  Yes Sensation Testing Intact to touch and monofilament testing bilaterally:  Yes Pulse Check Posterior Tibialis and Dorsalis pulse intact bilaterally:  Yes Comments Rash to bilateral plantar surface and in between intertriginous areas.     Depression screen [facility] 2/9 10/29/2017 01/07/2017 10/25/2016  Decreased Interest 2 1 1   Down, Depressed, Hopeless 3 1 2   PHQ - 2 Score 5 2 3   Altered sleeping 3 1  3  Tired, decreased energy 3 0 2  Change in appetite 3 0 3  Feeling bad or failure about yourself  3 1 1   Trouble concentrating 2 1 1   Moving slowly or fidgety/restless 1 0 0  Suicidal thoughts 3 0 0  PHQ-9 Score 23 5 13    GAD 7 : Generalized Anxiety Score 10/29/2017 01/07/2017 10/25/2016 09/14/2016  Nervous, Anxious, on Edge 2 1 1 1   Control/stop worrying 3 2 2 2   Worry too much - different things 3 2 3 3   Trouble relaxing 3 1 2 3   Restless 2 0 0 0  Easily annoyed or irritable 2 0 1 0  Afraid - awful might happen 2 0 1 1  Total GAD 7 Score 17 6 10 10      Assessment & Plan:   1. Uncontrolled type 2 diabetes mellitus with diabetic neuropathy, with long-term current use of insulin (HCC)  - POCT UA - Microalbumin - insulin aspart (novoLOG) injection 10 Units - CMP and Liver - Lipid Panel - Glucose (CBG) - HgB A1c - atorvastatin (LIPITOR) 40 MG tablet; Take 1 tablet  (40 mg total) daily by mouth.  Dispense: 90 tablet; Refill: 1 - Insulin Glargine (LANTUS SOLOSTAR) 100 UNIT/ML Solostar Pen; Inject 50 Units daily into the skin.  Dispense: 5 pen; Refill: 5 - insulin aspart (NOVOLOG) 100 UNIT/ML FlexPen; CBG Less than 70,  Do not give insulin. CBG 70-120 =   0 units CBG 121-140 = 2 units CBG 141-180 =  4 units CBG 181-220 =  6 units CBG  221-260 = 8 units CBG 261-300 = 10 units CBG 301-350 = 12 units CBG 351-400 = 14 units Greater than 400 = 16 units. Recheck CBG in 30 minutes. Contact office.  Dispense: 15 mL; Refill: 11 - gabapentin (NEURONTIN) 300 MG capsule; Take 1 capsule (300 mg total) at bedtime by mouth.  Dispense: 30 capsule; Refill: 2  2. Depression with anxiety  LCSW spoke with patient and provided counseling resources. - buPROPion (WELLBUTRIN SR) 150 MG 12 hr tablet; Take 1 tablet (150 mg total) 2 (two) times daily by mouth.  Dispense: 60 tablet; Refill: 2  3. Essential hypertension  - POCT UA - Microalbumin - Lipid Panel - hydrALAZINE (APRESOLINE) 100 MG tablet; Take 1 tablet (100 mg total) every 8 (eight) hours by mouth.  Dispense: 90 tablet; Refill: 2 - hydrochlorothiazide (HYDRODIURIL) 25 MG tablet; Take 1 tablet (25 mg total) daily by mouth.  Dispense: 30 tablet; Refill: 2 - lisinopril (PRINIVIL,ZESTRIL) 40 MG tablet; Take 1 tablet (40 mg total) daily by mouth.  Dispense: 30 tablet; Refill: 2 - eplerenone (INSPRA) 50 MG tablet; Take 1 tablet (50 mg total) daily by mouth.  Dispense: 30 tablet; Refill: 3  4. DOE (dyspnea on exertion)  - EKG 12-Lead  5. History of macular degeneration  - Ambulatory referral to Ophthalmology  6. Screening for breast cancer  - MM SCREENING BREAST TOMO BILATERAL; Future  7. Tinea pedis of both feet  - ketoconazole (NIZORAL) 2 % cream; Apply 1 application daily topically. To rash between toes  Dispense: 15 g; Refill: 0  8. Medication refill  - famotidine (PEPCID) 20 MG tablet; Take 1 tablet (20  mg total) 2 (two) times daily before a meal by mouth. As needed.  Dispense: 30 tablet; Refill: 11      Follow-up: Return in about 2 weeks (around 11/12/2017) for DM/HTN.   Alfonse Spruce FNP

## 2017-10-30 LAB — CMP AND LIVER
ALT: 17 IU/L (ref 0–32)
AST: 13 IU/L (ref 0–40)
Albumin: 4.5 g/dL (ref 3.5–5.5)
Alkaline Phosphatase: 94 IU/L (ref 39–117)
BILIRUBIN TOTAL: 0.2 mg/dL (ref 0.0–1.2)
BUN: 13 mg/dL (ref 6–24)
Bilirubin, Direct: 0.08 mg/dL (ref 0.00–0.40)
CALCIUM: 10.5 mg/dL — AB (ref 8.7–10.2)
CO2: 27 mmol/L (ref 20–29)
Chloride: 95 mmol/L — ABNORMAL LOW (ref 96–106)
Creatinine, Ser: 0.99 mg/dL (ref 0.57–1.00)
GFR, EST AFRICAN AMERICAN: 73 mL/min/{1.73_m2} (ref >59)
GFR, EST NON AFRICAN AMERICAN: 63 mL/min/{1.73_m2} (ref >59)
GLUCOSE: 344 mg/dL — AB (ref 65–99)
Potassium: 4.7 mmol/L (ref 3.5–5.2)
Sodium: 139 mmol/L (ref 134–144)
Total Protein: 7.8 g/dL (ref 6.0–8.5)

## 2017-10-30 LAB — LIPID PANEL
CHOL/HDL RATIO: 3.8 ratio (ref 0.0–4.4)
Cholesterol, Total: 232 mg/dL — ABNORMAL HIGH (ref 100–199)
HDL: 61 mg/dL (ref >39)
LDL CALC: 148 mg/dL — AB (ref 0–99)
Triglycerides: 116 mg/dL (ref 0–149)
VLDL CHOLESTEROL CAL: 23 mg/dL (ref 5–40)

## 2017-11-11 ENCOUNTER — Telehealth: Payer: Self-pay

## 2017-11-11 NOTE — Telephone Encounter (Signed)
-----   Message from Alfonse Spruce, [place] sent at 11/11/2017 12:54 PM EST ----- Kidney function normal Liver function normal Lipid levels were elevated. This can increase your risk of heart disease overtime. Start taking you atorvastatin daily. Start eating a diet low in saturated fat. Limit your intake of fried foods, red meats, and whole milk. Increase activity.

## 2017-11-11 NOTE — Telephone Encounter (Signed)
CMA call regarding lab results   Patient did not answer but left a detailed message & to call back if have any questions

## 2017-11-13 ENCOUNTER — Encounter (HOSPITAL_COMMUNITY): Payer: Self-pay | Admitting: Emergency Medicine

## 2017-11-13 ENCOUNTER — Emergency Department (HOSPITAL_COMMUNITY): Payer: Self-pay

## 2017-11-13 ENCOUNTER — Other Ambulatory Visit: Payer: Self-pay

## 2017-11-13 ENCOUNTER — Emergency Department (HOSPITAL_COMMUNITY)
Admission: EM | Admit: 2017-11-13 | Discharge: 2017-11-13 | Disposition: A | Discharge status: Home / Self Care | Payer: Self-pay | Attending: Emergency Medicine | Admitting: Emergency Medicine

## 2017-11-13 DIAGNOSIS — I10 Essential (primary) hypertension: Secondary | ICD-10-CM | POA: Insufficient documentation

## 2017-11-13 DIAGNOSIS — Z79899 Other long term (current) drug therapy: Secondary | ICD-10-CM | POA: Insufficient documentation

## 2017-11-13 DIAGNOSIS — R739 Hyperglycemia, unspecified: Secondary | ICD-10-CM | POA: Insufficient documentation

## 2017-11-13 DIAGNOSIS — R29898 Other symptoms and signs involving the musculoskeletal system: Secondary | ICD-10-CM | POA: Insufficient documentation

## 2017-11-13 DIAGNOSIS — Z87891 Personal history of nicotine dependence: Secondary | ICD-10-CM | POA: Insufficient documentation

## 2017-11-13 DIAGNOSIS — E1140 Type 2 diabetes mellitus with diabetic neuropathy, unspecified: Secondary | ICD-10-CM | POA: Insufficient documentation

## 2017-11-13 DIAGNOSIS — Z794 Long term (current) use of insulin: Secondary | ICD-10-CM | POA: Insufficient documentation

## 2017-11-13 LAB — I-STAT VENOUS BLOOD GAS, ED
ACID-BASE DEFICIT: 1 mmol/L (ref 0.0–2.0)
BICARBONATE: 27.2 mmol/L (ref 20.0–28.0)
O2 Saturation: 34 %
PH VEN: 7.25 (ref 7.250–7.430)
PO2 VEN: 25 mmHg — AB (ref 32.0–45.0)
TCO2: 29 mmol/L (ref 22–32)
pCO2, Ven: 62 mmHg — ABNORMAL HIGH (ref 44.0–60.0)

## 2017-11-13 LAB — I-STAT CHEM 8, ED
BUN: 22 mg/dL — AB (ref 6–20)
CALCIUM ION: 1.14 mmol/L — AB (ref 1.15–1.40)
CHLORIDE: 96 mmol/L — AB (ref 101–111)
Creatinine, Ser: 1.3 mg/dL — ABNORMAL HIGH (ref 0.44–1.00)
GLUCOSE: 549 mg/dL — AB (ref 65–99)
HCT: 40 % (ref 36.0–46.0)
Hemoglobin: 13.6 g/dL (ref 12.0–15.0)
Potassium: 4.2 mmol/L (ref 3.5–5.1)
SODIUM: 134 mmol/L — AB (ref 135–145)
TCO2: 25 mmol/L (ref 22–32)

## 2017-11-13 LAB — RAPID URINE DRUG SCREEN, HOSP PERFORMED
AMPHETAMINES: NOT DETECTED
Barbiturates: NOT DETECTED
Benzodiazepines: NOT DETECTED
Cocaine: NOT DETECTED
Opiates: NOT DETECTED
Tetrahydrocannabinol: NOT DETECTED

## 2017-11-13 LAB — URINALYSIS, ROUTINE W REFLEX MICROSCOPIC
Bilirubin Urine: NEGATIVE
HGB URINE DIPSTICK: NEGATIVE
KETONES UR: NEGATIVE mg/dL
NITRITE: NEGATIVE
PH: 5 (ref 5.0–8.0)
PROTEIN: NEGATIVE mg/dL
Specific Gravity, Urine: 1.01 (ref 1.005–1.030)

## 2017-11-13 LAB — DIFFERENTIAL
Basophils Absolute: 0.1 10*3/uL (ref 0.0–0.1)
Basophils Relative: 1 %
Eosinophils Absolute: 0.4 10*3/uL (ref 0.0–0.7)
Eosinophils Relative: 4 %
LYMPHS ABS: 4.2 10*3/uL — AB (ref 0.7–4.0)
LYMPHS PCT: 42 %
MONO ABS: 0.6 10*3/uL (ref 0.1–1.0)
MONOS PCT: 7 %
NEUTROS ABS: 4.6 10*3/uL (ref 1.7–7.7)
Neutrophils Relative %: 46 %

## 2017-11-13 LAB — COMPREHENSIVE METABOLIC PANEL
ALK PHOS: 97 U/L (ref 38–126)
ALT: 19 U/L (ref 14–54)
AST: 15 U/L (ref 15–41)
Albumin: 3.5 g/dL (ref 3.5–5.0)
Anion gap: 8 (ref 5–15)
BILIRUBIN TOTAL: 0.3 mg/dL (ref 0.3–1.2)
BUN: 20 mg/dL (ref 6–20)
CALCIUM: 8.9 mg/dL (ref 8.9–10.3)
CO2: 24 mmol/L (ref 22–32)
CREATININE: 1.41 mg/dL — AB (ref 0.44–1.00)
Chloride: 98 mmol/L — ABNORMAL LOW (ref 101–111)
GFR, EST AFRICAN AMERICAN: 47 mL/min — AB (ref >60)
GFR, EST NON AFRICAN AMERICAN: 40 mL/min — AB (ref >60)
Glucose, Bld: 548 mg/dL (ref 65–99)
Potassium: 4 mmol/L (ref 3.5–5.1)
SODIUM: 130 mmol/L — AB (ref 135–145)
Total Protein: 7.2 g/dL (ref 6.5–8.1)

## 2017-11-13 LAB — CBC
HEMATOCRIT: 37.8 % (ref 36.0–46.0)
Hemoglobin: 12.6 g/dL (ref 12.0–15.0)
MCH: 28 pg (ref 26.0–34.0)
MCHC: 33.3 g/dL (ref 30.0–36.0)
MCV: 84 fL (ref 78.0–100.0)
Platelets: 262 10*3/uL (ref 150–400)
RBC: 4.5 MIL/uL (ref 3.87–5.11)
RDW: 12.3 % (ref 11.5–15.5)
WBC: 9.8 10*3/uL (ref 4.0–10.5)

## 2017-11-13 LAB — ETHANOL: Alcohol, Ethyl (B): <10 mg/dL (ref <10)

## 2017-11-13 LAB — I-STAT BETA HCG BLOOD, ED (MC, WL, AP ONLY): I-stat hCG, quantitative: <5 m[IU]/mL (ref <5)

## 2017-11-13 LAB — I-STAT TROPONIN, ED: Troponin i, poc: 0 ng/mL (ref 0.00–0.08)

## 2017-11-13 LAB — PROTIME-INR
INR: 0.91
Prothrombin Time: 12.2 seconds (ref 11.4–15.2)

## 2017-11-13 LAB — APTT: aPTT: 31 seconds (ref 24–36)

## 2017-11-13 LAB — CBG MONITORING, ED: Glucose-Capillary: 339 mg/dL — ABNORMAL HIGH (ref 65–99)

## 2017-11-13 MED ORDER — HYDRALAZINE HCL 25 MG PO TABS
100.0000 mg | ORAL_TABLET | Freq: Once | ORAL | Status: AC
  Administered 2017-11-13: 100 mg via ORAL
  Filled 2017-11-13: qty 4

## 2017-11-13 MED ORDER — INSULIN ASPART 100 UNIT/ML ~~LOC~~ SOLN
10.0000 [IU] | Freq: Once | SUBCUTANEOUS | Status: AC
  Administered 2017-11-13: 10 [IU] via INTRAVENOUS
  Filled 2017-11-13: qty 1

## 2017-11-13 NOTE — ED Notes (Signed)
Pt to MRI

## 2017-11-13 NOTE — Discharge Instructions (Signed)
Your workup today showed no evidence of a stroke or a problem with your low back causing your weakness.  The neurology team did not feel you had a stroke.  Your blood pressure improved with your medications and your glucose is improving.  We did not find evidence of other infections or severe abnormalities requiring admission.  As your weakness has improved we feel you are safe for discharge home.  Please follow-up with your primary care physician in the next few days.  If any symptoms change or worsen, please return to the nearest emergency department.

## 2017-11-13 NOTE — Consult Note (Addendum)
Requesting Physician: Dr. Gustavus Messing    Chief Complaint: Code stroke  History obtained from:  Patient     HPI:                                                                                                                                         Nancy [name] is an 56 y.o. female with multiple risk factors for stroke.  Patient awoke this morning and was fine at 545 this a.m.  At approximately 715 she noted that she felt weak in her lower extremities on her left greater than right.  Patient walked to her daughter's door as they share an apartment.  At that time daughter who became nervous as she was weaker on that left leg.  Patient was brought to the emergency department.  Upon entering the ED code stroke was called.  CT of head was within normal limits.  On exam patient showed positive Hoover's on that left leg.  Patient was not a TPA candidate and code stroke was called off however patient is going to be admitted for further evaluation for stroke as she has have multiple risk factors.  It should be known that patient did have a death in the family approximately 2 months ago.  Blood pressure on arrival was 195/97, blood glucose of 549, BUN 22, creatinine 1.3, sodium 134  Date last known well: Date: 11/13/2017 Time last known well: Time: 05:45 tPA Given: No: Minimal symptoms NIH stroke scale 1  Modified Rankin: Rankin Score=0    Past Medical History:  Diagnosis Date  . Anemia 2006  . Depression 2014   previously on amitryptiline   . 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 Fracture of left ankle 1997   . Gastroparesis 07/2016  . HOH (hard of hearing) 2004   . Hyperlipidemia 2006  . Hypertension 2006  . IBS (irritable bowel syndrome) 2002   . Leukopenia 2015   . Shingles 2009   . Thyroid nodule 2004    Past Surgical History:  Procedure Laterality Date  . ABDOMINAL HYSTERECTOMY  2005  . CESAREAN SECTION  1983   . ESOPHAGOGASTRODUODENOSCOPY (EGD) WITH  PROPOFOL Left 08/26/2014   Procedure: ESOPHAGOGASTRODUODENOSCOPY (EGD) WITH PROPOFOL;  Surgeon: Arta Silence, MD;  Location: [facility];  Service: Endoscopy;  Laterality: Left;    Family History  Problem Relation Age of Onset  . Hypertension Mother   . Heart disease Mother   . Diabetes Mother   . Thyroid disease Mother   . Congestive Heart Failure Mother   . Breast cancer Maternal Grandmother   . Colon cancer Maternal Grandfather   . Heart attack Sister   . Heart disease Brother   . Hyperlipidemia Brother   . Hypertension Brother   . Diabetes Father   . Breast cancer Maternal Aunt    Social History:  reports that she has quit smoking. Her smoking  use included cigarettes. She has a 0.12 pack-year smoking history. she has never used smokeless tobacco. She reports that she drinks alcohol. She reports that she does not use drugs.  Allergies:  Allergies  Allergen Reactions  . Hydrocodone Other (See Comments)    Upset stomach  . Metformin And Related Nausea And Vomiting and Other (See Comments)    Stomach pains   . Sulfur Hives    Medications:                                                                                                                           No current facility-administered medications for this encounter.    Current Outpatient Medications  Medication Sig Dispense Refill  . acetaminophen (TYLENOL) 500 MG tablet Take 1,000 mg by mouth every 6 (six) hours as needed for mild pain.    Marland Kitchen atorvastatin (LIPITOR) 40 MG tablet Take 1 tablet (40 mg total) daily by mouth. 90 tablet 1  . buPROPion (WELLBUTRIN SR) 150 MG 12 hr tablet Take 1 tablet (150 mg total) 2 (two) times daily by mouth. 60 tablet 2  . cyclobenzaprine (FLEXERIL) 10 MG tablet Take 0.5 tablets (5 mg total) by mouth 3 (three) times daily as needed for muscle spasms. 60 tablet 5  . diclofenac sodium (VOLTAREN) 1 % GEL Apply 4 g topically 4 (four) times daily. 100 g 3  . Elastic Bandages & Supports  (MEDICAL COMPRESSION SOCKS) MISC 1 each by Does not apply route daily. 25-30 mm Hg pressure (Patient not taking: Reported on 12/04/2016) 2 each 0  . eplerenone (INSPRA) 50 MG tablet Take 1 tablet (50 mg total) daily by mouth. 30 tablet 3  . famotidine (PEPCID) 20 MG tablet Take 1 tablet (20 mg total) 2 (two) times daily before a meal by mouth. As needed. 30 tablet 11  . gabapentin (NEURONTIN) 300 MG capsule Take 1 capsule (300 mg total) at bedtime by mouth. 30 capsule 2  . Glucosamine-Chondroitin 750-600 MG TABS Take 2 tablets by mouth daily.  0  . hydrALAZINE (APRESOLINE) 100 MG tablet Take 1 tablet (100 mg total) every 8 (eight) hours by mouth. 90 tablet 2  . hydrochlorothiazide (HYDRODIURIL) 25 MG tablet Take 1 tablet (25 mg total) daily by mouth. 30 tablet 2  . hydrocortisone valerate ointment (WESTCORT) 0.2 % Apply 1 application topically 2 (two) times daily. 45 g 0  . insulin aspart (NOVOLOG) 100 UNIT/ML FlexPen CBG Less than 70,  Do not give insulin. CBG 70-120 =   0 units CBG 121-140 = 2 units CBG 141-180 =  4 units CBG 181-220 =  6 units CBG  221-260 = 8 units CBG 261-300 = 10 units CBG 301-350 = 12 units CBG 351-400 = 14 units Greater than 400 = 16 units. Recheck CBG in 30 minutes. Contact office. 15 mL 11  . Insulin Glargine (LANTUS SOLOSTAR) 100 UNIT/ML Solostar Pen Inject 50 Units daily into the skin. 5 pen 5  . Insulin  Pen Needle (PEN NEEDLES) 32G X 4 MM MISC 1 each by Does not apply route 4 (four) times daily. 120 each 11  . ketoconazole (NIZORAL) 2 % cream Apply 1 application daily topically. To rash between toes 15 g 0  . lisinopril (PRINIVIL,ZESTRIL) 40 MG tablet Take 1 tablet (40 mg total) daily by mouth. 30 tablet 2  . metoCLOPramide (REGLAN) 10 MG tablet Take 1 tablet (10 mg total) by mouth every 6 (six) hours as needed for nausea. 30 tablet 0  . Multiple Vitamins-Minerals (OCUVITE ADULT 50+) CAPS Take 1 capsule daily by mouth. Take with meal. 30 capsule 11  . naproxen  (NAPROSYN) 500 MG tablet Take 1 tablet (500 mg total) by mouth 2 (two) times daily with a meal. 30 tablet 0  . ondansetron (ZOFRAN) 4 MG tablet Take 1 tablet (4 mg total) by mouth every 8 (eight) hours as needed for nausea or vomiting. 20 tablet 0  . polyethylene glycol powder (GLYCOLAX/MIRALAX) powder Take 17 g by mouth daily. (Patient not taking: Reported on 12/04/2016) 3350 g 1  . Turmeric 500 MG CAPS Take 500 mg by mouth daily. (Patient not taking: Reported on 01/07/2017)    . zolpidem (AMBIEN) 10 MG tablet Take 1 tablet (10 mg total) by mouth at bedtime as needed for sleep. 30 tablet 5     ROS:                                                                                                                                       History obtained from the patient  General ROS: negative for - chills, fatigue, fever, night sweats, weight gain or weight loss Psychological ROS: negative for - behavioral disorder, hallucinations, memory difficulties, mood swings or suicidal ideation Ophthalmic ROS: negative for - blurry vision, double vision, eye pain or loss of vision ENT ROS: negative for - epistaxis, nasal discharge, oral lesions, sore throat, tinnitus or vertigo Allergy and Immunology ROS: negative for - hives or itchy/watery eyes Hematological and Lymphatic ROS: negative for - bleeding problems, bruising or swollen lymph nodes Endocrine ROS: negative for - galactorrhea, hair pattern changes, polydipsia/polyuria or temperature intolerance Respiratory ROS: negative for - cough, hemoptysis, shortness of breath or wheezing Cardiovascular ROS: negative for - chest pain, dyspnea on exertion, edema or irregular heartbeat Gastrointestinal ROS: negative for - abdominal pain, diarrhea, hematemesis, nausea/vomiting or stool incontinence Genito-Urinary ROS: negative for - dysuria, hematuria, incontinence or urinary frequency/urgency Musculoskeletal ROS: negative for - joint swelling or muscular  weakness Neurological ROS: as noted in HPI Dermatological ROS: negative for rash and skin lesion changes  Neurologic Examination:  Blood pressure (!) 195/97, pulse 97, temperature 98.3 F (36.8 C), temperature source Oral, resp. rate 14, SpO2 100 %.  HEENT-  Normocephalic, no lesions, without obvious abnormality.  Normal external eye and conjunctiva.  Normal TM's bilaterally.  Normal auditory canals and external ears. Normal external nose, mucus membranes and septum.  Normal pharynx. Cardiovascular- S1, S2 normal, pulses palpable throughout   Lungs- chest clear, no wheezing, rales, normal symmetric air entry Abdomen- normal findings: bowel sounds normal Extremities- no edema Lymph-no adenopathy palpable Musculoskeletal-no joint tenderness, deformity or swelling Skin-warm and dry, no hyperpigmentation, vitiligo, or suspicious lesions  Neurological Examination Mental Status: Alert, oriented, thought content appropriate.  Speech fluent without evidence of aphasia.  Able to follow 3 step commands without difficulty. Cranial Nerves: II: Discs flat bilaterally; Visual fields grossly normal,  III,IV, VI: ptosis not present, extra-ocular motions intact bilaterally, pupils equal, round, reactive to light and accommodation V,VII: smile symmetric, facial light touch sensation normal bilaterally VIII: hearing normal bilaterally IX,X: uvula rises symmetrically XI: bilateral shoulder shrug XII: midline tongue extension Motor: Right : Upper extremity   5/5    Left:     Upper extremity   5/5  Lower extremity   5/5     Lower extremity   4/5 - effort dependent  Positive Hoover's Tone and bulk:normal tone throughout; no atrophy noted Sensory: Pinprick and light touch intact throughout, bilaterally Deep Tendon Reflexes: 1+ and symmetric throughout Plantars: Right: downgoing   Left:  downgoing Cerebellar: normal finger-to-nose,and normal heel-to-shin test Gait: Not tested    Lab Results: Basic Metabolic Panel: Recent Labs  Lab 11/13/17 0837  NA 134*  K 4.2  CL 96*  GLUCOSE 549*  BUN 22*  CREATININE 1.30*   CBC: Recent Labs  Lab 11/13/17 0826 11/13/17 0837  WBC 9.8  --   NEUTROABS 4.6  --   HGB 12.6 13.6  HCT 37.8 40.0  MCV 84.0  --   PLT 262  --   Imaging: Ct Head Code Stroke Wo Contrast  Result Date: 11/13/2017 CLINICAL DATA:  Code stroke.  Left leg weakness EXAM: CT HEAD WITHOUT CONTRAST TECHNIQUE: Contiguous axial images were obtained from the base of the skull through the vertex without intravenous contrast. COMPARISON:  None. FINDINGS: Brain: No evidence of acute infarction, hemorrhage, hydrocephalus, extra-axial collection or mass lesion/mass effect. Enlargement of the sella filled with CSF consistent with empty sella. Vascular: Negative for hyperdense vessel Skull: Negative Sinuses/Orbits: Negative Other: None ASPECTS ([place] Stroke Program Early CT Score) - Ganglionic level infarction (caudate, lentiform nuclei, internal capsule, insula, M1-M3 cortex): 7 - Supraganglionic infarction (M4-M6 cortex): 3 Total score (0-10 with 10 being normal): 10 IMPRESSION: 1. Negative CT head.  Empty sella 2. ASPECTS is 10 These results were called by telephone at the time of interpretation on 11/13/2017 at 8:47 am to Dr. Rory Percy, who verbally acknowledged these results. Electronically Signed   By: Franchot Gallo M.D.   On: 11/13/2017 08:48   Assessment and plan discussed with with attending physician and they are in agreement.    Etta Quill PA-C [facility] [phone]  11/13/2017, 8:51 AM   Attending Neurohospitalist Addendum Patient seen and examined. Agree with the history and physical as documented above. Agree with the plan as documented, which I helped formulate. I have independently obtaining history, and review of systems on my exam and  the patient and reviewed the chart including pertinent labs and pertinent imaging.   Assessment: 56 y.o. female presenting to the emergency department with  isolated left leg weakness.  Code stroke was called however exam revealed effort dependent minimal weakness in the left leg, NIH stroke scale of 1.   She did have hyperglycemia with blood sugars in the 500s and hypertension with systolic blood pressure in the 190s on arrival. In my opinion, this is not likely a classical presentation for stroke.  Her symptoms could either be related to the hypertensive urgency or the hyperglycemia, but a functional overlay to her exam cannot be excluded. She does have the risk factors for cerebrovascular disease, for which we will recommend at least obtaining an MRI of the brain and lumbar spine.  If that is normal, no further neurological workup is indicated inpatient.  Stroke Risk Factors - diabetes mellitus, hyperlipidemia and hypertension  Recommend MRI of the brain and MRI of the lumbar spine w/o contrast If both of them were normal, no further neurological workup. She might still need admission for glycemic and hypertensive urgency.  I will defer that decision to the ED provider. If MRI of the brain shows evidence of stroke, then she will require stroke workup as below -Telemetry monitoring -CT Angiogram of Head and neck -Echocardiogram -HgbA1c, fasting lipid panel -Frequent neuro checks -Prophylactic therapy-Antiplatelet med: Aspirin - dose 325mg  PO or 300mg  PR -Atorvastatin 80 mg PO daily -Risk factor modification -PT consult, OT consult, Speech consult  Please call neurology with questions. --- Amie Portland, MD [facility] [phone]  If 7pm to 7am, please call on call as listed on AMION.  CRITICAL CARE ATTESTATION This patient is critically ill and at significant risk of neurological worsening, death and care requires constant monitoring of vital signs,  hemodynamics,respiratory and cardiac monitoring, neurological assessment, discussion with family, other specialists and medical decision making of high complexity. I spent 30  minutes of neurocritical care time  in the care of  this patient.

## 2017-11-13 NOTE — Code Documentation (Signed)
56yo female arriving to [facility] via private vehicle at [phone].  Patient from home where she was LKW at 0554 when she got up to get some water.  She went back to bed and woke up around 0700 and fell.  Patient presented with c/o right leg weakness, however, on ED RN exam found to have left leg weakness.  Code stroke activated.  Patient to CT.  Stroke team to the bedside.  NIHSS 1, see documentation for details and code stroke times.  Patient with left leg drift on exam, however, weakness appears to be effort dependent.  No other focal deficits on exam.  Of note, patient hypertensive and reports taking her BP medication last night but has not been taking her medicine as she reports not having it.  Dr. Rory Percy at the bedside.  No acute stroke treatment.  Code stroke canceled.  Bedside handoff with ED RN Santiago Glad.

## 2017-11-13 NOTE — ED Notes (Signed)
Pt stable, ambulatory, states understanding of discharge instructions 

## 2017-11-13 NOTE — ED Triage Notes (Signed)
Pt here with c/o right leg weakness , pt does have some weakness on the left leg , pt was up at 0554 to get water and could walk fine , pt was up 0700 and fell in the floor not able to walk

## 2017-11-13 NOTE — ED Notes (Signed)
Lab called with critical result glucose 548

## 2017-11-13 NOTE — ED Notes (Signed)
Code Stroke activated @ [phone] via Marden Noble w/ [place].

## 2017-11-13 NOTE — ED Provider Notes (Signed)
[facility] DEPARTMENT Provider Note   CSN: [id number] Arrival date & time: 11/13/17  0805     History   Chief Complaint Chief Complaint  Patient presents with  . Extremity Weakness    HPI Nancy [name] is a 56 y.o. female.  The history is provided by the patient and medical records. No language interpreter was used.  Neurologic Problem  This is a new problem. The current episode started 3 to 5 hours ago. The problem occurs constantly. The problem has not changed since onset.Pertinent negatives include no chest pain, no abdominal pain, no headaches and no shortness of breath. Nothing aggravates the symptoms. Nothing relieves the symptoms. She has tried nothing for the symptoms. The treatment provided no relief.    Past Medical History:  Diagnosis Date  . Anemia 2006  . Depression 2014   previously on amitryptiline   . Diabetes mellitus with neurological manifestation ([place]) 2006  . Fracture of left ankle 1997   . Gastroparesis 07/2016  . HOH (hard of hearing) 2004   . Hyperlipidemia 2006  . Hypertension 2006  . IBS (irritable bowel syndrome) 2002   . Leukopenia 2015   . Shingles 2009   . Thyroid nodule 2004    Patient Active Problem List   Diagnosis Date Noted  . Uncontrolled type 2 diabetes mellitus with diabetic neuropathy, with long-term current use of insulin ([place]) 10/29/2017  . History of macular degeneration 10/29/2017  . Dermatitis 10/29/2016  . Chronic cervical pain 10/29/2016  . Myalgia 09/14/2016  . Insomnia 09/14/2016  . Tinea pedis of both feet 08/20/2016  . Macular degeneration 08/17/2016  . Low back pain 09/21/2014  . Hypertension 09/21/2014  . Diabetic gastroparesis associated with type 2 diabetes mellitus ([place]) 09/14/2014  . Thyroid nodule 08/17/2014  . Obesity (BMI 30-39.9) 08/17/2014  . Hyperlipidemia 08/05/2014  . Type II diabetes mellitus with neurological manifestations, uncontrolled ([place]) 08/04/2014  .  Elevated serum protein level 08/04/2014    Past Surgical History:  Procedure Laterality Date  . ABDOMINAL HYSTERECTOMY  2005  . CESAREAN SECTION  1983   . ESOPHAGOGASTRODUODENOSCOPY (EGD) WITH PROPOFOL Left 08/26/2014   Procedure: ESOPHAGOGASTRODUODENOSCOPY (EGD) WITH PROPOFOL;  Surgeon: Arta Silence, MD;  Location: [facility];  Service: Endoscopy;  Laterality: Left;    OB History    No data available       Home Medications    Prior to Admission medications   Medication Sig Start Date End Date Taking? Authorizing Provider  acetaminophen (TYLENOL) 500 MG tablet Take 1,000 mg by mouth every 6 (six) hours as needed for mild pain.    [provider]  atorvastatin (LIPITOR) 40 MG tablet Take 1 tablet (40 mg total) daily by mouth. 10/29/17   Alfonse Spruce, FNP  buPROPion (WELLBUTRIN SR) 150 MG 12 hr tablet Take 1 tablet (150 mg total) 2 (two) times daily by mouth. 10/29/17   Hairston, Maylon Peppers, FNP  cyclobenzaprine (FLEXERIL) 10 MG tablet Take 0.5 tablets (5 mg total) by mouth 3 (three) times daily as needed for muscle spasms. 12/04/16   Funches, Adriana Mccallum, MD  diclofenac sodium (VOLTAREN) 1 % GEL Apply 4 g topically 4 (four) times daily. 09/14/16   Boykin Nearing, MD  Elastic Bandages & Supports (MEDICAL COMPRESSION SOCKS) MISC 1 each by Does not apply route daily. 25-30 mm Hg pressure Patient not taking: Reported on 12/04/2016 09/14/16   Boykin Nearing, MD  eplerenone (INSPRA) 50 MG tablet Take 1 tablet (50 mg total) daily  by mouth. 10/29/17   Alfonse Spruce, FNP  famotidine (PEPCID) 20 MG tablet Take 1 tablet (20 mg total) 2 (two) times daily before a meal by mouth. As needed. 10/29/17   Alfonse Spruce, FNP  gabapentin (NEURONTIN) 300 MG capsule Take 1 capsule (300 mg total) at bedtime by mouth. 10/29/17   Alfonse Spruce, FNP  Glucosamine-Chondroitin 750-600 MG TABS Take 2 tablets by mouth daily. 12/04/16   Boykin Nearing, MD  hydrALAZINE  (APRESOLINE) 100 MG tablet Take 1 tablet (100 mg total) every 8 (eight) hours by mouth. 10/29/17   Hairston, Maylon Peppers, FNP  hydrochlorothiazide (HYDRODIURIL) 25 MG tablet Take 1 tablet (25 mg total) daily by mouth. 10/29/17   Hairston, Maylon Peppers, FNP  hydrocortisone valerate ointment (WESTCORT) 0.2 % Apply 1 application topically 2 (two) times daily. 10/25/16   Funches, Adriana Mccallum, MD  insulin aspart (NOVOLOG) 100 UNIT/ML FlexPen CBG Less than 70,  Do not give insulin. CBG 70-120 =   0 units CBG 121-140 = 2 units CBG 141-180 =  4 units CBG 181-220 =  6 units CBG  221-260 = 8 units CBG 261-300 = 10 units CBG 301-350 = 12 units CBG 351-400 = 14 units Greater than 400 = 16 units. Recheck CBG in 30 minutes. Contact office. 10/29/17   Alfonse Spruce, FNP  Insulin Glargine (LANTUS SOLOSTAR) 100 UNIT/ML Solostar Pen Inject 50 Units daily into the skin. 10/29/17   Alfonse Spruce, FNP  Insulin Pen Needle (PEN NEEDLES) 32G X 4 MM MISC 1 each by Does not apply route 4 (four) times daily. 12/04/16   Funches, Adriana Mccallum, MD  ketoconazole (NIZORAL) 2 % cream Apply 1 application daily topically. To rash between toes 10/29/17   Hairston, Toy Baker R, FNP  lisinopril (PRINIVIL,ZESTRIL) 40 MG tablet Take 1 tablet (40 mg total) daily by mouth. 10/29/17   Hairston, Maylon Peppers, FNP  metoCLOPramide (REGLAN) 10 MG tablet Take 1 tablet (10 mg total) by mouth every 6 (six) hours as needed for nausea. 07/26/16   Oswald Hillock, MD  Multiple Vitamins-Minerals (OCUVITE ADULT 50+) CAPS Take 1 capsule daily by mouth. Take with meal. 10/29/17   Hairston, Maylon Peppers, FNP  naproxen (NAPROSYN) 500 MG tablet Take 1 tablet (500 mg total) by mouth 2 (two) times daily with a meal. 12/04/16   Funches, Adriana Mccallum, MD  ondansetron (ZOFRAN) 4 MG tablet Take 1 tablet (4 mg total) by mouth every 8 (eight) hours as needed for nausea or vomiting. 07/26/16   Oswald Hillock, MD  polyethylene glycol powder (GLYCOLAX/MIRALAX) powder Take 17 g  by mouth daily. Patient not taking: Reported on 12/04/2016 08/17/16   Boykin Nearing, MD  Turmeric 500 MG CAPS Take 500 mg by mouth daily. Patient not taking: Reported on 01/07/2017 12/04/16   Boykin Nearing, MD  zolpidem (AMBIEN) 10 MG tablet Take 1 tablet (10 mg total) by mouth at bedtime as needed for sleep. 12/04/16   Boykin Nearing, MD    Family History Family History  Problem Relation Age of Onset  . Hypertension Mother   . Heart disease Mother   . Diabetes Mother   . Thyroid disease Mother   . Congestive Heart Failure Mother   . Breast cancer Maternal Grandmother   . Colon cancer Maternal Grandfather   . Heart attack Sister   . Heart disease Brother   . Hyperlipidemia Brother   . Hypertension Brother   . Diabetes Father   . Breast cancer Maternal Aunt  Social History Social History   Tobacco Use  . Smoking status: Former Smoker    Packs/day: 0.25    Years: 0.50    Pack years: 0.12    Types: Cigarettes  . Smokeless tobacco: Never Used  Substance Use Topics  . Alcohol use: Yes    Comment: occasion   . Drug use: No     Allergies   Hydrocodone; Metformin and related; and Sulfur   Review of Systems Review of Systems  Constitutional: Negative for chills, diaphoresis, fatigue and fever.  HENT: Negative for congestion and rhinorrhea.   Respiratory: Negative for chest tightness, shortness of breath, wheezing and stridor.   Cardiovascular: Negative for chest pain and palpitations.  Gastrointestinal: Negative for abdominal pain, constipation, diarrhea, nausea and vomiting.  Genitourinary: Negative for dysuria.  Musculoskeletal: Negative for back pain, neck pain and neck stiffness.  Neurological: Positive for weakness. Negative for seizures, light-headedness, numbness and headaches.  Psychiatric/Behavioral: Negative for agitation.  All other systems reviewed and are negative.    Physical Exam Updated Vital Signs BP (!) 195/97 (BP Location: Left Arm)    Pulse 97   Temp 98.3 F (36.8 C) (Oral)   Resp 14   SpO2 100%   Physical Exam  Constitutional: She is oriented to person, place, and time. She appears well-developed and well-nourished. No distress.  HENT:  Head: Normocephalic.  Mouth/Throat: Oropharynx is clear and moist. No oropharyngeal exudate.  Eyes: Conjunctivae and EOM are normal. Pupils are equal, round, and reactive to light.  Neck: Normal range of motion.  Cardiovascular: Normal rate and intact distal pulses.  No murmur heard. Pulmonary/Chest: Effort normal. No respiratory distress. She has no wheezes. She has no rales. She exhibits no tenderness.  Abdominal: Soft. There is no tenderness.  Musculoskeletal: She exhibits no edema, tenderness or deformity.  Neurological: She is alert and oriented to person, place, and time. She is not disoriented. She displays no tremor. No cranial nerve deficit or sensory deficit. She exhibits abnormal muscle tone. Coordination normal. GCS eye subscore is 4. GCS verbal subscore is 5. GCS motor subscore is 6.  Patient had weakness with left leg raise compared to right.  No facial droop.  Symmetric grip strength in arms.  Normal sensation throughout.  No speech abnormality.  No other focal neurologic deficits on my initial exam.  Finger-nose-finger normal.  Skin: Capillary refill takes less than 2 seconds. No rash noted. She is not diaphoretic. No erythema.  Psychiatric: She has a normal mood and affect.  Nursing note and vitals reviewed.    ED Treatments / Results  Labs (all labs ordered are listed, but only abnormal results are displayed) Labs Reviewed  DIFFERENTIAL - Abnormal; Notable for the following components:      Result Value   Lymphs Abs 4.2 (*)    All other components within normal limits  COMPREHENSIVE METABOLIC PANEL - Abnormal; Notable for the following components:   Sodium 130 (*)    Chloride 98 (*)    Glucose, Bld 548 (*)    Creatinine, Ser 1.41 (*)    GFR calc non Af Amer  40 (*)    GFR calc Af Amer 47 (*)    All other components within normal limits  URINALYSIS, ROUTINE W REFLEX MICROSCOPIC - Abnormal; Notable for the following components:   Color, Urine STRAW (*)    APPearance HAZY (*)    Glucose, UA >=500 (*)    Leukocytes, UA LARGE (*)    Bacteria, UA RARE (*)  Squamous Epithelial / LPF 0-5 (*)    All other components within normal limits  I-STAT CHEM 8, ED - Abnormal; Notable for the following components:   Sodium 134 (*)    Chloride 96 (*)    BUN 22 (*)    Creatinine, Ser 1.30 (*)    Glucose, Bld 549 (*)    Calcium, Ion 1.14 (*)    All other components within normal limits  I-STAT VENOUS BLOOD GAS, ED - Abnormal; Notable for the following components:   pCO2, Ven 62.0 (*)    pO2, Ven 25.0 (*)    All other components within normal limits  CBG MONITORING, ED - Abnormal; Notable for the following components:   Glucose-Capillary 339 (*)    All other components within normal limits  ETHANOL  PROTIME-INR  APTT  CBC  RAPID URINE DRUG SCREEN, HOSP PERFORMED  I-STAT TROPONIN, ED  I-STAT BETA HCG BLOOD, ED ([facility], [facility], [facility] ONLY)    EKG  EKG Interpretation  Date/Time:  Wednesday November 13 2017 08:10:33 EST Ventricular Rate:  98 PR Interval:  154 QRS Duration: 84 QT Interval:  360 QTC Calculation: 459 R Axis:   -43 Text Interpretation:  Normal sinus rhythm Left axis deviation Abnormal ECG When compared to prior, no significant changes seen.  No STEMI Confirmed by Antony Blackbird [id number]) on 11/13/2017 8:51:43 AM       Radiology Mr Brain Wo Contrast  Result Date: 11/13/2017 CLINICAL DATA:  56 year old female code stroke, left side weakness. EXAM: MRI HEAD WITHOUT CONTRAST TECHNIQUE: Multiplanar, multiecho pulse sequences of the brain and surrounding structures were obtained without intravenous contrast. COMPARISON:  Head CT without contrast 0840 hours today. FINDINGS: Brain: No convincing restricted diffusion to suggest acute infarction.  Partially empty sella. No midline shift, mass effect, evidence of mass lesion, ventriculomegaly, extra-axial collection or acute intracranial hemorrhage. Cervicomedullary junction within normal limits. Pearline Cables and white matter signal is within normal limits for age throughout the brain. No encephalomalacia or chronic cerebral blood products identified. Vascular: Major intracranial vascular flow voids are preserved. Skull and upper cervical spine: Partially visible cervical spine disc bulging at C3-C4 and C4-C5. Suggestion of mild degenerative cervical spinal stenosis. Hyperostosis of the calvarium. Visualized bone marrow signal is within normal limits. Sinuses/Orbits: Normal orbits soft tissues. Small polypoid areas of mucosal thickening in the left anterior ethmoid and right maxillary sinus. Paranasal sinuses are otherwise clear. Other: Mastoid air cells are clear. In the posterior right parotid space there is a 2 x 3 cm oval T2 hyperintense T1 hypointense soft tissue mass. This has increased diffusion signal (series 4, image 15). Other visible scalp and face soft tissues are within normal limits. IMPRESSION: 1. No acute intracranial abnormality. Normal noncontrast MRI appearance of the brain. 2. 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 Recommend outpatient ENT follow-up. 3. Partially visible cervical spinal stenosis related to disc bulging. Electronically Signed   By: Genevie Ann M.D.   On: 11/13/2017 11:05   Mr Lumbar Spine Wo Contrast  Result Date: 11/13/2017 CLINICAL DATA:  New onset of left lower extremity weakness. EXAM: MRI LUMBAR SPINE WITHOUT CONTRAST TECHNIQUE: Multiplanar, multisequence MR imaging of the lumbar spine was performed. No intravenous contrast was administered. COMPARISON:  Radiography 10/29/2016 FINDINGS: Segmentation:  5 lumbar type vertebral bodies. Alignment:  No significant malalignment by MR. Vertebrae:  No fracture or  focal lesion. Conus medullaris and cauda equina: Conus extends to the L2 level. Conus  and cauda equina appear normal. Paraspinal and other soft tissues: Distended bladder. Disc levels: No abnormality at L3-4 or above. L4-5: No disc abnormality. Bilateral facet osteoarthritis with hypertrophy, mild edema and joint effusions. This could be a cause of back pain or referred facet syndrome pain. Anterolisthesis could occur with standing or flexion. No neural compression is suspected. L5-S1: Mild bulging of the disc towards the left. Facet osteoarthritis, less advanced than at the L5-S1 level. This could contribute to back pain. IMPRESSION: No compressive lesions seen to explain left lower extremity weakness. L4-5 facet osteoarthritis with hypertrophy, edema and joint effusions. This could certainly be a cause of back pain or referred facet syndrome pain. Anterolisthesis could occur with standing or flexion. L5-S1 disc bulge and facet osteoarthritis which could contribute to low back pain. Electronically Signed   By: Nelson Chimes M.D.   On: 11/13/2017 11:21   Ct Head Code Stroke Wo Contrast  Result Date: 11/13/2017 CLINICAL DATA:  Code stroke.  Left leg weakness EXAM: CT HEAD WITHOUT CONTRAST TECHNIQUE: Contiguous axial images were obtained from the base of the skull through the vertex without intravenous contrast. COMPARISON:  None. FINDINGS: Brain: No evidence of acute infarction, hemorrhage, hydrocephalus, extra-axial collection or mass lesion/mass effect. Enlargement of the sella filled with CSF consistent with empty sella. Vascular: Negative for hyperdense vessel Skull: Negative Sinuses/Orbits: Negative Other: None ASPECTS (Start Stroke Program Early CT Score) - Ganglionic level infarction (caudate, lentiform nuclei, internal capsule, insula, M1-M3 cortex): 7 - Supraganglionic infarction (M4-M6 cortex): 3 Total score (0-10 with 10 being normal): 10 IMPRESSION: 1. Negative CT head.  Empty sella 2. ASPECTS is 10  These results were called by telephone at the time of interpretation on 11/13/2017 at 8:47 am to Dr. Rory Percy, who verbally acknowledged these results. Electronically Signed   By: Franchot Gallo M.D.   On: 11/13/2017 08:48    Procedures Procedures (including critical care time)  Medications Ordered in ED Medications  hydrALAZINE (APRESOLINE) tablet 100 mg (100 mg Oral Given 11/13/17 1154)  insulin aspart (novoLOG) injection 10 Units (10 Units Intravenous Given 11/13/17 1326)     Initial Impression / Assessment and Plan / ED Course  I have reviewed the triage vital signs and the nursing notes.  Pertinent labs & imaging results that were available during my care of the patient were reviewed by me and considered in my medical decision making (see chart for details).     Nancy [name] is a 56 y.o. female with a past medical history significant for hypertension, hyperlipidemia, and diabetes who presents with left leg weakness.  Patient was activated as a code stroke in triage after it was discovered that her last normal was at 5:54 AM this morning.  She reports that she has been having to use the bathroom frequently multiple times a night.  She says that she ambulate a normal at that time but then at 7 AM when she woke up, she fell to the ground because her left leg was weak.  She reports this is a new problem and she has never had this before.  She denies any headache, vision changes, nausea, vomiting, chest pain, abdominal pain, conservation, diarrhea, or dysuria.  She does report some chronic pain in her low back.  She says that her glucose frequently runs elevated.  Patient quickly taken to CT scanner and evaluated by neurology team.  Neurology felt that patient needs workup for elevated glucose and hypertension which may be contributing to symptoms but they  do not feel she is having a stroke.  They did however recommend MRI of the brain and lumbar spine to further evaluate for etiology of  leg weakness.  These tests were ordered.  CT of the head revealed no acute abnormalities aside from empty sella.  EKG showed no STEMI.    Patient's blood pressure was elevated, she will be given blood pressure medication and reevaluated.  Will have workup with blood testing to look for DKA or other etiologies of symptoms.    Laboratory testing revealed hyperglycemia but no DKA.  Anion gap was not elevated.  It was not significantly acidotic and there were no ketones in the urine.  MRI of the head and lumbar spine revealed no significant abnormalities other systems.  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  Patient will follow up with ENT for this.  On reassessment, patient reports her weakness has resolved.  Patient had normal leg strength on reassessment.  No other focal deficits.  Due to hyperglycemia, patient was given insulin.  Patient also given her home blood pressure medicine with resolution of hypertension.  Unclear etiology of patient's transient weakness however, based on neurology evaluation and reassuring workup, do not feel patient requires admission.  Patient will follow up with her PCP and understood strict return precautions.  Patient will continue her home medications.  Patient had no other questions or concerns and was discharged in good condition.   Final Clinical Impressions(s) / ED Diagnoses   Final diagnoses:  Transient weakness of left leg  Hyperglycemia    ED Discharge Orders    None      Clinical Impression: 1. Transient weakness of left leg   2. Hyperglycemia     Disposition: Discharge  Condition: Good  I have discussed the results, Dx and Tx plan with the pt(& family if present). He/she/they expressed understanding and agree(s) with the plan. Discharge instructions discussed at great length. Strict return precautions discussed and pt &/or family have verbalized understanding of the instructions. No further questions  at time of discharge.    This SmartLink is deprecated. Use AVSMEDLIST instead to display the medication list for a patient.  Follow Up: Alfonse Spruce, FNP [place] [id number] [phone]     [place] [address] [id number] [place] [phone]  If symptoms worsen     Legacy Carrender, Gwenyth Allegra, MD 11/13/17 2149

## 2017-12-26 ENCOUNTER — Ambulatory Visit: Payer: Self-pay | Attending: Family Medicine | Admitting: Licensed Clinical Social Worker

## 2017-12-26 ENCOUNTER — Ambulatory Visit: Payer: Self-pay | Attending: Family Medicine | Admitting: Family Medicine

## 2017-12-26 ENCOUNTER — Encounter: Payer: Self-pay | Admitting: Family Medicine

## 2017-12-26 VITALS — BP 156/93 | HR 104 | Temp 98.2°F | Resp 16 | Ht 63.0 in | Wt 207.4 lb

## 2017-12-26 DIAGNOSIS — E1165 Type 2 diabetes mellitus with hyperglycemia: Secondary | ICD-10-CM

## 2017-12-26 DIAGNOSIS — M5441 Lumbago with sciatica, right side: Secondary | ICD-10-CM | POA: Insufficient documentation

## 2017-12-26 DIAGNOSIS — Z1239 Encounter for other screening for malignant neoplasm of breast: Secondary | ICD-10-CM

## 2017-12-26 DIAGNOSIS — F331 Major depressive disorder, recurrent, moderate: Secondary | ICD-10-CM

## 2017-12-26 DIAGNOSIS — I10 Essential (primary) hypertension: Secondary | ICD-10-CM | POA: Insufficient documentation

## 2017-12-26 DIAGNOSIS — Z79899 Other long term (current) drug therapy: Secondary | ICD-10-CM | POA: Insufficient documentation

## 2017-12-26 DIAGNOSIS — F418 Other specified anxiety disorders: Secondary | ICD-10-CM | POA: Insufficient documentation

## 2017-12-26 DIAGNOSIS — K3184 Gastroparesis: Secondary | ICD-10-CM

## 2017-12-26 DIAGNOSIS — E1140 Type 2 diabetes mellitus with diabetic neuropathy, unspecified: Secondary | ICD-10-CM | POA: Insufficient documentation

## 2017-12-26 DIAGNOSIS — E785 Hyperlipidemia, unspecified: Secondary | ICD-10-CM | POA: Insufficient documentation

## 2017-12-26 DIAGNOSIS — G8929 Other chronic pain: Secondary | ICD-10-CM | POA: Insufficient documentation

## 2017-12-26 DIAGNOSIS — D490 Neoplasm of unspecified behavior of digestive system: Secondary | ICD-10-CM | POA: Insufficient documentation

## 2017-12-26 DIAGNOSIS — IMO0002 Reserved for concepts with insufficient information to code with codable children: Secondary | ICD-10-CM

## 2017-12-26 DIAGNOSIS — Z794 Long term (current) use of insulin: Secondary | ICD-10-CM | POA: Insufficient documentation

## 2017-12-26 DIAGNOSIS — E1143 Type 2 diabetes mellitus with diabetic autonomic (poly)neuropathy: Secondary | ICD-10-CM

## 2017-12-26 DIAGNOSIS — M542 Cervicalgia: Secondary | ICD-10-CM

## 2017-12-26 DIAGNOSIS — Z1231 Encounter for screening mammogram for malignant neoplasm of breast: Secondary | ICD-10-CM

## 2017-12-26 DIAGNOSIS — Z5189 Encounter for other specified aftercare: Secondary | ICD-10-CM | POA: Insufficient documentation

## 2017-12-26 DIAGNOSIS — M2550 Pain in unspecified joint: Secondary | ICD-10-CM | POA: Insufficient documentation

## 2017-12-26 DIAGNOSIS — S91109A Unspecified open wound of unspecified toe(s) without damage to nail, initial encounter: Secondary | ICD-10-CM

## 2017-12-26 LAB — POCT URINALYSIS DIPSTICK
Bilirubin, UA: NEGATIVE
Ketones, UA: NEGATIVE
Nitrite, UA: NEGATIVE
PROTEIN UA: NEGATIVE
RBC UA: NEGATIVE
Spec Grav, UA: 1.01 (ref 1.010–1.025)
Urobilinogen, UA: 0.2 E.U./dL
pH, UA: 7 (ref 5.0–8.0)

## 2017-12-26 LAB — GLUCOSE, POCT (MANUAL RESULT ENTRY): POC Glucose: 402 mg/dl — AB (ref 70–99)

## 2017-12-26 MED ORDER — SITAGLIPTIN PHOSPHATE 50 MG PO TABS: 50.0000 mg | ORAL_TABLET | Freq: Every day | ORAL | 2 refills | Status: DC

## 2017-12-26 MED ORDER — INSULIN GLARGINE 100 UNIT/ML SOLOSTAR PEN: 50.0000 [IU] | PEN_INJECTOR | Freq: Every day | SUBCUTANEOUS | 5 refills | Status: DC

## 2017-12-26 MED ORDER — BACITRACIN 500 UNIT/GM EX OINT: 1.0000 "application " | TOPICAL_OINTMENT | Freq: Once | CUTANEOUS | Status: DC

## 2017-12-26 MED ORDER — INSULIN ASPART 100 UNIT/ML ~~LOC~~ SOLN: 12.0000 [IU] | Freq: Once | SUBCUTANEOUS | Status: DC

## 2017-12-26 MED ORDER — CEPHALEXIN 500 MG PO CAPS: 500.0000 mg | ORAL_CAPSULE | Freq: Four times a day (QID) | ORAL | 0 refills | Status: DC

## 2017-12-26 MED ORDER — GLIPIZIDE 5 MG PO TABS: 5.0000 mg | ORAL_TABLET | Freq: Two times a day (BID) | ORAL | 2 refills | Status: DC

## 2017-12-26 MED ORDER — TRAZODONE HCL 50 MG PO TABS: 25.0000 mg | ORAL_TABLET | Freq: Every evening | ORAL | 1 refills | Status: DC | PRN

## 2017-12-26 MED ORDER — AMLODIPINE BESYLATE 5 MG PO TABS: 5.0000 mg | ORAL_TABLET | Freq: Every day | ORAL | 2 refills | Status: DC

## 2017-12-26 MED ORDER — CYCLOBENZAPRINE HCL 10 MG PO TABS: 5.0000 mg | ORAL_TABLET | Freq: Two times a day (BID) | ORAL | 1 refills | Status: DC | PRN

## 2017-12-26 MED ORDER — BUPROPION HCL ER (SR) 150 MG PO TB12: 150.0000 mg | ORAL_TABLET | Freq: Two times a day (BID) | ORAL | 2 refills | Status: DC

## 2017-12-26 MED FILL — JANUVIA 50 MG TABLET: 50 | 30 days supply | Qty: 30 | Fill #0

## 2017-12-26 MED FILL — AMLODIPINE BESYLATE 5 MG TA: 5 | 30 days supply | Qty: 30 | Fill #0

## 2017-12-26 MED FILL — CEPHALEXIN 500 MG CAPSULE: 500 | 5 days supply | Qty: 20 | Fill #0

## 2017-12-26 MED FILL — CYCLOBENZAPRINE 10 MG TAB: 10 | 30 days supply | Qty: 30 | Fill #0

## 2017-12-26 MED FILL — ?GLIPIZIDE 5MG TABLET: 5 | 30 days supply | Qty: 60 | Fill #0

## 2017-12-26 MED FILL — BUPROPION SR 150 MG TABLET: 150 | 30 days supply | Qty: 60 | Fill #0

## 2017-12-26 MED FILL — traZODone HCL 50 MG TABS: 50 | 30 days supply | Qty: 30 | Fill #0

## 2017-12-26 MED FILL — !LANTUS SOLOSTAR 100UNITS/M: 100 | 30 days supply | Qty: 15 | Fill #0

## 2017-12-26 NOTE — Patient Instructions (Signed)
Wound Care, Adult Taking care of your wound properly can help to prevent pain and infection. It can also help your wound to heal more quickly. How is this treated? Wound care  Follow instructions from your health care provider about how to take care of your wound. Make sure you: ? Wash your hands with soap and water before you change the bandage (dressing). If soap and water are not available, use hand sanitizer. ? Change your dressing as told by your health care provider. ? Leave stitches (sutures), skin glue, or adhesive strips in place. These skin closures may need to stay in place for 2 weeks or longer. If adhesive strip edges start to loosen and curl up, you may trim the loose edges. Do not remove adhesive strips completely unless your health care provider tells you to do that.  Check your wound area every day for signs of infection. Check for: ? More redness, swelling, or pain. ? More fluid or blood. ? Warmth. ? Pus or a bad smell.  Ask your health care provider if you should clean the wound with mild soap and water. Doing this may include: ? Using a clean towel to pat the wound dry after cleaning it. Do not rub or scrub the wound. ? Applying a cream or ointment. Do this only as told by your health care provider. ? Covering the incision with a clean dressing.  Ask your health care provider when you can leave the wound uncovered. Medicines   If you were prescribed an antibiotic medicine, cream, or ointment, take or use the antibiotic as told by your health care provider. Do not stop taking or using the antibiotic even if your condition improves.  Take over-the-counter and prescription medicines only as told by your health care provider. If you were prescribed pain medicine, take it at least 30 minutes before doing any wound care or as told by your health care provider. General instructions  Return to your normal activities as told by your health care provider. Ask your health care  provider what activities are safe.  Do not scratch or pick at the wound.  Keep all follow-up visits as told by your health care provider. This is important.  Eat a diet that includes protein, vitamin A, vitamin C, and other nutrient-rich foods. These help the wound heal: ? Protein-rich foods include meat, dairy, beans, nuts, and other sources. ? Vitamin A-rich foods include carrots and dark green, leafy vegetables. ? Vitamin C-rich foods include citrus, tomatoes, and other fruits and vegetables. ? Nutrient-rich foods have protein, carbohydrates, fat, vitamins, or minerals. Eat a variety of healthy foods including vegetables, fruits, and whole grains. Contact a health care provider if:  You received a tetanus shot and you have swelling, severe pain, redness, or bleeding at the injection site.  Your pain is not controlled with medicine.  You have more redness, swelling, or pain around the wound.  You have more fluid or blood coming from the wound.  Your wound feels warm to the touch.  You have pus or a bad smell coming from the wound.  You have a fever or chills.  You are nauseous or you vomit.  You are dizzy. Get help right away if:  You have a red streak going away from your wound.  The edges of the wound open up and separate.  Your wound is bleeding and the bleeding does not stop with gentle pressure.  You have a rash.  You faint.  You have trouble  breathing. This information is not intended to replace advice given to you by your health care provider. Make sure you discuss any questions you have with your health care provider. Document Released: 09/11/2008 Document Revised: 08/01/2016 Document Reviewed: 06/19/2016 Elsevier Interactive Patient Education  2017 [employer].    Diabetes and Foot Care Diabetes may cause you to have problems because of poor blood supply (circulation) to your feet and legs. This may cause the skin on your feet to become thinner, break  easier, and heal more slowly. Your skin may become dry, and the skin may peel and crack. You may also have nerve damage in your legs and feet causing decreased feeling in them. You may not notice minor injuries to your feet that could lead to infections or more serious problems. Taking care of your feet is one of the most important things you can do for yourself. Follow these instructions at home:  Wear shoes at all times, even in the house. Do not go barefoot. Bare feet are easily injured.  Check your feet daily for blisters, cuts, and redness. If you cannot see the bottom of your feet, use a mirror or ask someone for help.  Wash your feet with warm water (do not use hot water) and mild soap. Then pat your feet and the areas between your toes until they are completely dry. Do not soak your feet as this can dry your skin.  Apply a moisturizing lotion or petroleum jelly (that does not contain alcohol and is unscented) to the skin on your feet and to dry, brittle toenails. Do not apply lotion between your toes.  Trim your toenails straight across. Do not dig under them or around the cuticle. File the edges of your nails with an emery board or nail file.  Do not cut corns or calluses or try to remove them with medicine.  Wear clean socks or stockings every day. Make sure they are not too tight. Do not wear knee-high stockings since they may decrease blood flow to your legs.  Wear shoes that fit properly and have enough cushioning. To break in new shoes, wear them for just a few hours a day. This prevents you from injuring your feet. Always look in your shoes before you put them on to be sure there are no objects inside.  Do not cross your legs. This may decrease the blood flow to your feet.  If you find a minor scrape, cut, or break in the skin on your feet, keep it and the skin around it clean and dry. These areas may be cleansed with mild soap and water. Do not cleanse the area with peroxide,  alcohol, or iodine.  When you remove an adhesive bandage, be sure not to damage the skin around it.  If you have a wound, look at it several times a day to make sure it is healing.  Do not use heating pads or hot water bottles. They may burn your skin. If you have lost feeling in your feet or legs, you may not know it is happening until it is too late.  Make sure your health care provider performs a complete foot exam at least annually or more often if you have foot problems. Report any cuts, sores, or bruises to your health care provider immediately. Contact a health care provider if:  You have an injury that is not healing.  You have cuts or breaks in the skin.  You have an ingrown nail.  You  notice redness on your legs or feet.  You feel burning or tingling in your legs or feet.  You have pain or cramps in your legs and feet.  Your legs or feet are numb.  Your feet always feel cold. Get help right away if:  There is increasing redness, swelling, or pain in or around a wound.  There is a red line that goes up your leg.  Pus is coming from a wound.  You develop a fever or as directed by your health care provider.  You notice a bad smell coming from an ulcer or wound. This information is not intended to replace advice given to you by your health care provider. Make sure you discuss any questions you have with your health care provider. Document Released: 11/30/2000 Document Revised: 05/10/2016 Document Reviewed: 05/12/2013 Elsevier Interactive Patient Education  2017 [employer].

## 2017-12-26 NOTE — Progress Notes (Signed)
Subjective:  Patient ID: Nancy [name], female    DOB: 1960-12-20  Age: 57 y.o. MRN: [id number]  CC: Hospitalization Follow-up   HPI Nancy [name] presents for hospitalization follow up. History of ED visit 11/13/18 for new onset of weakness of the left leg. She was worked up as code stroke. CT given and was negative for any acute abnormalities. MRI was negative for any intracranial abnormality. 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 She denies any reoccurring weakness or cardiac symptoms since ED discharge. 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 She reports cramping pain 6/10, 10/10 at worst. She denies any dysphagia.  DM/HTN follow up. Diabetes Mellitus: Patient presents for follow up of diabetes. Symptoms: none. She reports her last ophthalmology visit was Dec. 2017. She report history of macular degeneration. She is taking Ocuvite daily. Patient denies foot ulcerations, hyperglycemia, nausea, paresthesia of the feet and vomitting.  Evaluation to date has been included: fasting blood sugar, fasting lipid panel and hemoglobin A1C.  Home sugars: checks CBG daily 2 hours post-prandial. She does not bring glucometer or log with her to office. Hypertension: Patient here for follow-up of elevated blood pressure. She is not exercising and is not adherent to low salt diet. She does not check BP home. Cardiac symptoms none. She does reports SOB with activities like walking. She denies any chronic cough, wheezing, or hemoptysis. Patient denies chest pain, chest pressure/discomfort, claudication, exertional chest pressure/discomfort, lower extremity edema, near-syncope, palpitations and syncope.  Cardiovascular risk factors: diabetes mellitus, dyslipidemia, hypertension, obesity (BMI >= 30 kg/m2) and sedentary lifestyle. Use of agents associated with hypertension: none. History of target  organ damage: none.  Depressed mood with anxiety: Patient complains of depression mood and anxious mood. She complains of anhedonia, depressed mood, insomnia and irritability. Onset was approximately 10 months ago, gradually worsening since that time.  She denies current suicidal and homicidal plan or intent. .Possible organic causes contributing are: none.  Risk factors: negative life event : job loss in March, home was foreclosed on, mother died a few months after, history of panic attacks. She is currently going through divorce process.  She is agreeable to speaking with LCSW at this time. She is taking medication. She c/o chronic arthralgias to multiple joints. Denies any history of injury.   Outpatient Medications Prior to Visit  Medication Sig Dispense Refill  . acetaminophen (TYLENOL) 500 MG tablet Take 1,000 mg by mouth every 6 (six) hours as needed for mild pain.    Marland Kitchen atorvastatin (LIPITOR) 40 MG tablet Take 1 tablet (40 mg total) daily by mouth. 90 tablet 1  . eplerenone (INSPRA) 50 MG tablet Take 1 tablet (50 mg total) daily by mouth. 30 tablet 3  . famotidine (PEPCID) 20 MG tablet Take 1 tablet (20 mg total) 2 (two) times daily before a meal by mouth. As needed. 30 tablet 11  . gabapentin (NEURONTIN) 300 MG capsule Take 1 capsule (300 mg total) at bedtime by mouth. 30 capsule 2  . hydrALAZINE (APRESOLINE) 100 MG tablet Take 1 tablet (100 mg total) every 8 (eight) hours by mouth. 90 tablet 2  . hydrocortisone valerate ointment (WESTCORT) 0.2 % Apply 1 application topically 2 (two) times daily. 45 g 0  . Insulin Pen Needle (PEN NEEDLES) 32G X 4 MM MISC 1 each by Does not apply route 4 (four) times daily. 120 each 11  . ketoconazole (NIZORAL)  2 % cream Apply 1 application daily topically. To rash between toes 15 g 0  . lisinopril (PRINIVIL,ZESTRIL) 40 MG tablet Take 1 tablet (40 mg total) daily by mouth. 30 tablet 2  . magnesium chloride (SLOW-MAG) 64 MG TBEC SR tablet Take 1 tablet by mouth.     Marland Kitchen buPROPion (WELLBUTRIN SR) 150 MG 12 hr tablet Take 1 tablet (150 mg total) 2 (two) times daily by mouth. 60 tablet 2  . hydrochlorothiazide (HYDRODIURIL) 25 MG tablet Take 1 tablet (25 mg total) daily by mouth. 30 tablet 2  . Insulin Glargine (LANTUS SOLOSTAR) 100 UNIT/ML Solostar Pen Inject 50 Units daily into the skin. (Patient taking differently: Inject 44 Units into the skin daily. ) 5 pen 5  . diclofenac sodium (VOLTAREN) 1 % GEL Apply 4 g topically 4 (four) times daily. (Patient not taking: Reported on 11/13/2017) 100 g 3  . Elastic Bandages & Supports (MEDICAL COMPRESSION SOCKS) MISC 1 each by Does not apply route daily. 25-30 mm Hg pressure (Patient not taking: Reported on 12/04/2016) 2 each 0  . Glucosamine-Chondroitin 750-600 MG TABS Take 2 tablets by mouth daily. (Patient not taking: Reported on 11/13/2017)  0  . insulin aspart (NOVOLOG) 100 UNIT/ML FlexPen CBG Less than 70,  Do not give insulin. CBG 70-120 =   0 units CBG 121-140 = 2 units CBG 141-180 =  4 units CBG 181-220 =  6 units CBG  221-260 = 8 units CBG 261-300 = 10 units CBG 301-350 = 12 units CBG 351-400 = 14 units Greater than 400 = 16 units. Recheck CBG in 30 minutes. Contact office. (Patient not taking: Reported on 12/26/2017) 15 mL 11  . Multiple Vitamins-Minerals (OCUVITE ADULT 50+) CAPS Take 1 capsule daily by mouth. Take with meal. (Patient not taking: Reported on 11/13/2017) 30 capsule 11  . naproxen (NAPROSYN) 500 MG tablet Take 1 tablet (500 mg total) by mouth 2 (two) times daily with a meal. (Patient not taking: Reported on 11/13/2017) 30 tablet 0  . polyethylene glycol powder (GLYCOLAX/MIRALAX) powder Take 17 g by mouth daily. (Patient not taking: Reported on 12/04/2016) 3350 g 1  . Turmeric 500 MG CAPS Take 500 mg by mouth daily. (Patient not taking: Reported on 01/07/2017)    . cyclobenzaprine (FLEXERIL) 10 MG tablet Take 0.5 tablets (5 mg total) by mouth 3 (three) times daily as needed for muscle spasms.  (Patient not taking: Reported on 11/13/2017) 60 tablet 5  . metoCLOPramide (REGLAN) 10 MG tablet Take 1 tablet (10 mg total) by mouth every 6 (six) hours as needed for nausea. (Patient not taking: Reported on 12/26/2017) 30 tablet 0  . ondansetron (ZOFRAN) 4 MG tablet Take 1 tablet (4 mg total) by mouth every 8 (eight) hours as needed for nausea or vomiting. (Patient not taking: Reported on 11/13/2017) 20 tablet 0  . zolpidem (AMBIEN) 10 MG tablet Take 1 tablet (10 mg total) by mouth at bedtime as needed for sleep. (Patient not taking: Reported on 11/13/2017) 30 tablet 5   No facility-administered medications prior to visit.      Review of Systems  Constitutional: Negative.   HENT: Negative.   Eyes:       History of macular degeneration.  Respiratory: Negative.   Cardiovascular: Negative.   Gastrointestinal: Negative.   Musculoskeletal: Positive for joint pain.  Skin: Negative.   Neurological: Negative.   Psychiatric/Behavioral: Positive for depression. Negative for suicidal ideas. The patient has insomnia.      Objective:  BP Marland Kitchen)  156/93 (BP Location: Right Arm, Patient Position: Sitting, Cuff Size: Normal)   Pulse (!) 104   Temp 98.2 F (36.8 C) (Oral)   Resp 16   Ht 5\' 3"  (1.6 m)   Wt 207 lb 6.4 oz (94.1 kg)   SpO2 99%   BMI 36.74 kg/m   BP/Weight 12/26/2017 11/13/2017 33/29/5188  Systolic BP 416 606 301  Diastolic BP 93 76 89  Wt. (Lbs) 207.4 - 206.6  BMI 36.74 - 36.6      Physical Exam  Nursing note and vitals reviewed. Constitutional: She appears well-developed and well-nourished.  Cardiovascular: Normal rate, regular rhythm, normal heart sounds and intact distal pulses.  Respiratory: Effort normal and breath sounds normal.  GI: Soft. Bowel sounds are normal.  Musculoskeletal:       Feet:  Psychiatric: She is slowed. She exhibits a depressed mood. She expresses no homicidal and no suicidal ideation. She expresses no suicidal plans and no homicidal plans. She  is communicative. She is attentive.    Depression screen [facility] 2/9 12/26/2017 10/29/2017 01/07/2017  Decreased Interest 1 2 1   Down, Depressed, Hopeless 1 3 1   PHQ - 2 Score 2 5 2   Altered sleeping 3 3 1   Tired, decreased energy 2 3 0  Change in appetite 3 3 0  Feeling bad or failure about yourself  3 3 1   Trouble concentrating 1 2 1   Moving slowly or fidgety/restless 1 1 0  Suicidal thoughts 0 3 0  PHQ-9 Score 15 23 5    GAD 7 : Generalized Anxiety Score 12/26/2017 10/29/2017 01/07/2017 10/25/2016  Nervous, Anxious, on Edge 1 2 1 1   Control/stop worrying 3 3 2 2   Worry too much - different things 3 3 2 3   Trouble relaxing 3 3 1 2   Restless 1 2 0 0  Easily annoyed or irritable 1 2 0 1  Afraid - awful might happen 3 2 0 1  Total GAD 7 Score 15 17 6 10      Assessment & Plan:   1. Uncontrolled type 2 diabetes mellitus with diabetic neuropathy, with long-term current use of insulin (HCC)   - Glucose (CBG) - insulin aspart (novoLOG) injection 12 Units - Urinalysis Dipstick - Insulin Glargine (LANTUS SOLOSTAR) 100 UNIT/ML Solostar Pen; Inject 50 Units into the skin daily.  Dispense: 5 pen; Refill: 5 - glipiZIDE (GLUCOTROL) 5 MG tablet; Take 1 tablet (5 mg total) by mouth 2 (two) times daily before a meal.  Dispense: 60 tablet; Refill: 2 - sitaGLIPtin (JANUVIA) 50 MG tablet; Take 1 tablet (50 mg total) by mouth daily.  Dispense: 30 tablet; Refill: 2  2. Essential hypertension   - amLODipine (NORVASC) 5 MG tablet; Take 1 tablet (5 mg total) by mouth daily.  Dispense: 30 tablet; Refill: 2  3. Hyperlipidemia, unspecified hyperlipidemia type    4. Avulsion of skin of toe, initial encounter Patient reports cutting her toenail and clipping her skin. - cephALEXin (KEFLEX) 500 MG capsule; Take 1 capsule (500 mg total) by mouth 4 (four) times daily.  Dispense: 20 capsule; Refill: 0 - bacitracin ointment 1 application  5. Chronic cervical pain  - cyclobenzaprine (FLEXERIL) 10 MG  tablet; Take 0.5 tablets (5 mg total) by mouth 2 (two) times daily as needed for muscle spasms.  Dispense: 60 tablet; Refill: 1  6. Chronic bilateral low back pain with right-sided sciatica  - cyclobenzaprine (FLEXERIL) 10 MG tablet; Take 0.5 tablets (5 mg total) by mouth 2 (two) times daily as needed for  muscle spasms.  Dispense: 60 tablet; Refill: 1  7. Arthralgia of multiple sites - Rheumatoid factor  8. Parotid tumor   - Ambulatory referral to ENT  9. Depression with anxiety LCSW spoke with patient and provided resources. - traZODone (DESYREL) 50 MG tablet; Take 0.5-1 tablets (25-50 mg total) by mouth at bedtime as needed for sleep.  Dispense: 30 tablet; Refill: 1 - buPROPion (WELLBUTRIN SR) 150 MG 12 hr tablet; Take 1 tablet (150 mg total) by mouth 2 (two) times daily.  Dispense: 60 tablet; Refill: 2  10. Breast cancer screening   - MM SCREENING BREAST TOMO BILATERAL; Future      Follow-up: Return in about 2 weeks (around 01/09/2018) for DM/ Wound Check.   Alfonse Spruce FNP

## 2017-12-26 NOTE — Progress Notes (Signed)
Patient Is here for a hospital follow-up. Patient stated her neck and back hurt due to arthritis. Patient stated she would like to talk about medication with PCP.

## 2017-12-27 LAB — RHEUMATOID FACTOR: Rhuematoid fact SerPl-aCnc: <10 IU/mL (ref 0.0–13.9)

## 2017-12-27 NOTE — BH Specialist Note (Signed)
Integrated Behavioral Health Initial Visit  MRN: [id number] Name: Nancy [name]  Number of [place] Clinician visits:: 2/6 Session Start time: 3:30 PM  Session End time: 4:00 PM Total time: 30 minutes  Type of Service: [place] Interpretor:No. Interpretor Name and Language: N/A   Warm Hand Off Completed.       SUBJECTIVE: Nancy [name] is a 57 y.o. female accompanied by self Patient was referred by FNP [place] for depression and anxiety. Patient reports the following symptoms/concerns: overwhelming feelings of sadness and worry, difficulty sleeping, low motivation, decreased concentration, and irritability, and hx of panic attacks Duration of problem: Beginning of 2018; Severity of problem: severe  OBJECTIVE: Mood: Anxious and Depressed and Affect: Tearful Risk of harm to self or others: No plan to harm self or others  LIFE CONTEXT: Family and Social: Pt is currently residing with adult daughter. She is receiving emotional support from child and siblings.  School/Work: Pt recently loss her job of over twenty years due to issues with management. Pt has savings that will assist in paying her bills for the next two months. Self-Care: Pt uses positive affirmations and watch inspirational videos on YouTube and Facebook Life Changes: In this year, pt lost her long-time job, was forced to sell her house, is grieving the loss of her mother, and is currently in the process of a divorce. Pt has to find somewhere to live by April 2019  GOALS ADDRESSED: Patient will: 1. Reduce symptoms of: anxiety and depression 2. Increase knowledge and/or ability of: coping skills  3. Demonstrate ability to: Increase adequate support systems for patient/family and Begin healthy grieving over loss  INTERVENTIONS: Interventions utilized: Solution-Focused Strategies, Supportive Counseling and Link to [employer]  Standardized  Assessments completed: GAD-7 and PHQ 2&9  ASSESSMENT: Patient currently experiencing depression and anxiety triggered by the loss her job, house, the death of her mother, and current divorce with spouse within a year. She reports overwhelming feelings of sadness and worry, difficulty sleeping, low motivation, decreased concentration, and irritability, and hx of panic attacks. Pt receives support from family and denies SI/HI/AVH.   Patient may benefit from psychotherapy. She has been participating in medication management; however, recently learned that she was not taking medication correctly. [place] educated pt on SMART goals . Pt agreed to establish goals to assist with building confidence and assist with improving physical health. LCSWA provided pt with community resources to assist with obtaining employment.   PLAN: 1. Follow up with behavioral health clinician on : Pt was encouraged to contact LCSWA if symptoms worsen or fail to improve to schedule behavioral appointments at [facility] 2. Behavioral recommendations: LCSWA recommends that pt apply healthy coping skills discussed, comply with medication management, and utilize provided resources. Pt is encouraged to schedule follow up appointment with LCSWA 3. Referral(s): [place] (In Clinic) 4. "From scale of 1-10, how likely are you to follow plan?": 10/10  Rebekah [place]field, LCSW 12/27/16 5:46 PM

## 2018-01-02 ENCOUNTER — Telehealth: Payer: Self-pay | Admitting: *Deleted

## 2018-01-02 NOTE — Telephone Encounter (Signed)
Medical Assistant left message on patient's home and cell voicemail. Medical Assistant left message on patient's home and cell voicemail. Patient is aware of not having rheumatoid arthritis.

## 2018-01-02 NOTE — Telephone Encounter (Signed)
-----   Message from Alfonse Spruce, [place] sent at 12/31/2017  1:45 PM EST ----- Rheumatoid factor is negative. You do not have rheumatoid arthritis.

## 2018-01-03 ENCOUNTER — Ambulatory Visit: Payer: Self-pay | Attending: Family Medicine

## 2018-01-09 ENCOUNTER — Encounter: Payer: Self-pay | Admitting: Family Medicine

## 2018-01-09 ENCOUNTER — Ambulatory Visit: Payer: Self-pay | Attending: Family Medicine | Admitting: Family Medicine

## 2018-01-09 VITALS — BP 126/79 | HR 99 | Temp 98.5°F | Resp 18 | Ht 63.0 in | Wt 205.0 lb

## 2018-01-09 DIAGNOSIS — E1140 Type 2 diabetes mellitus with diabetic neuropathy, unspecified: Secondary | ICD-10-CM | POA: Insufficient documentation

## 2018-01-09 DIAGNOSIS — IMO0002 Reserved for concepts with insufficient information to code with codable children: Secondary | ICD-10-CM

## 2018-01-09 DIAGNOSIS — L299 Pruritus, unspecified: Secondary | ICD-10-CM | POA: Insufficient documentation

## 2018-01-09 DIAGNOSIS — E1165 Type 2 diabetes mellitus with hyperglycemia: Secondary | ICD-10-CM | POA: Insufficient documentation

## 2018-01-09 DIAGNOSIS — Z0000 Encounter for general adult medical examination without abnormal findings: Secondary | ICD-10-CM | POA: Insufficient documentation

## 2018-01-09 DIAGNOSIS — B353 Tinea pedis: Secondary | ICD-10-CM | POA: Insufficient documentation

## 2018-01-09 DIAGNOSIS — Z5189 Encounter for other specified aftercare: Secondary | ICD-10-CM | POA: Insufficient documentation

## 2018-01-09 DIAGNOSIS — B372 Candidiasis of skin and nail: Secondary | ICD-10-CM | POA: Insufficient documentation

## 2018-01-09 DIAGNOSIS — Z79899 Other long term (current) drug therapy: Secondary | ICD-10-CM | POA: Insufficient documentation

## 2018-01-09 DIAGNOSIS — Z794 Long term (current) use of insulin: Secondary | ICD-10-CM | POA: Insufficient documentation

## 2018-01-09 DIAGNOSIS — L309 Dermatitis, unspecified: Secondary | ICD-10-CM | POA: Insufficient documentation

## 2018-01-09 LAB — GLUCOSE, POCT (MANUAL RESULT ENTRY): POC Glucose: 165 mg/dl — AB (ref 70–99)

## 2018-01-09 MED ORDER — GABAPENTIN 300 MG PO CAPS: 300.0000 mg | ORAL_CAPSULE | Freq: Every day | ORAL | 2 refills | Status: DC

## 2018-01-09 MED ORDER — EUCERIN EX CREA: TOPICAL_CREAM | CUTANEOUS | 0 refills | Status: DC | PRN

## 2018-01-09 MED ORDER — SITAGLIPTIN PHOSPHATE 100 MG PO TABS: 100.0000 mg | ORAL_TABLET | Freq: Every day | ORAL | 2 refills | Status: DC

## 2018-01-09 MED ORDER — KETOCONAZOLE 2 % EX CREA: 1.0000 "application " | TOPICAL_CREAM | Freq: Every day | CUTANEOUS | 0 refills | Status: DC

## 2018-01-09 MED ORDER — TRIAMCINOLONE ACETONIDE 0.025 % EX OINT: 1.0000 "application " | TOPICAL_OINTMENT | Freq: Two times a day (BID) | CUTANEOUS | 0 refills | Status: DC | PRN

## 2018-01-09 MED ORDER — NYSTATIN 100000 UNIT/GM EX POWD: Freq: Three times a day (TID) | CUTANEOUS | 0 refills | Status: DC

## 2018-01-09 MED ORDER — BACITRACIN-NEOMYCIN-POLYMYXIN 400-5-5000 EX OINT: TOPICAL_OINTMENT | Freq: Once | CUTANEOUS | Status: DC

## 2018-01-09 MED FILL — TRIAMCINOLONE 0.025% OINT: 0.025 | 15 days supply | Qty: 30 | Fill #0

## 2018-01-09 MED FILL — KETOCONAZOLE 2% CREAM: 2 | 15 days supply | Qty: 15 | Fill #0

## 2018-01-09 MED FILL — **JANUVIA 100 MG TABLET: 100 | 14 days supply | Qty: 14 | Fill #0

## 2018-01-09 MED FILL — NYSTOP 100,000 UNITS/GM PWD: 100000 | 5 days supply | Qty: 15 | Fill #0

## 2018-01-09 MED FILL — GABAPENTIN 300 MG CAPSULE: 300 | 30 days supply | Qty: 30 | Fill #0

## 2018-01-09 NOTE — Progress Notes (Signed)
Subjective:  Patient ID: Nancy [name], female    DOB: February 05, 1961  Age: 57 y.o. MRN: [id number]  CC: Diabetes   HPI [name] presents for wound care f/u. History of avulsion of skin of toe after she  reported attempting to cut her toenail and clipping her skin a few weeks ago. Diabetes Mellitus: History of dm neuropathy. She Nancy [name] requesting refill of gabapentin. Home sugars: CBG's 190-278. Treatment to date: insulin, glipizide, januvia.  Skin pruritis Onset Sunday. Locations bilateral axilla. She denies any skin lesions. She has tried nothing for symptoms.   Hand pruritis Onset a few months ago. Symptoms intermittent. She denies any new soaps, detergents, contact with plants, animals, contacts with similar symptoms. She denies taking anything for symptoms.    Outpatient Medications Prior to Visit  Medication Sig Dispense Refill  . acetaminophen (TYLENOL) 500 MG tablet Take 1,000 mg by mouth every 6 (six) hours as needed for mild pain.    Marland Kitchen amLODipine (NORVASC) 5 MG tablet Take 1 tablet (5 mg total) by mouth daily. 30 tablet 2  . atorvastatin (LIPITOR) 40 MG tablet Take 1 tablet (40 mg total) daily by mouth. 90 tablet 1  . buPROPion (WELLBUTRIN SR) 150 MG 12 hr tablet Take 1 tablet (150 mg total) by mouth 2 (two) times daily. 60 tablet 2  . cephALEXin (KEFLEX) 500 MG capsule Take 1 capsule (500 mg total) by mouth 4 (four) times daily. 20 capsule 0  . cyclobenzaprine (FLEXERIL) 10 MG tablet Take 0.5 tablets (5 mg total) by mouth 2 (two) times daily as needed for muscle spasms. 60 tablet 1  . eplerenone (INSPRA) 50 MG tablet Take 1 tablet (50 mg total) daily by mouth. 30 tablet 3  . famotidine (PEPCID) 20 MG tablet Take 1 tablet (20 mg total) 2 (two) times daily before a meal by mouth. As needed. 30 tablet 11  . glipiZIDE (GLUCOTROL) 5 MG tablet Take 1 tablet (5 mg total) by mouth 2 (two) times daily before a meal. 60 tablet 2  . hydrALAZINE (APRESOLINE) 100 MG tablet Take 1  tablet (100 mg total) every 8 (eight) hours by mouth. 90 tablet 2  . hydrocortisone valerate ointment (WESTCORT) 0.2 % Apply 1 application topically 2 (two) times daily. 45 g 0  . insulin aspart (NOVOLOG) 100 UNIT/ML FlexPen CBG Less than 70,  Do not give insulin. CBG 70-120 =   0 units CBG 121-140 = 2 units CBG 141-180 =  4 units CBG 181-220 =  6 units CBG  221-260 = 8 units CBG 261-300 = 10 units CBG 301-350 = 12 units CBG 351-400 = 14 units Greater than 400 = 16 units. Recheck CBG in 30 minutes. Contact office. 15 mL 11  . Insulin Glargine (LANTUS SOLOSTAR) 100 UNIT/ML Solostar Pen Inject 50 Units into the skin daily. 5 pen 5  . Insulin Pen Needle (PEN NEEDLES) 32G X 4 MM MISC 1 each by Does not apply route 4 (four) times daily. 120 each 11  . lisinopril (PRINIVIL,ZESTRIL) 40 MG tablet Take 1 tablet (40 mg total) daily by mouth. 30 tablet 2  . magnesium chloride (SLOW-MAG) 64 MG TBEC SR tablet Take 1 tablet by mouth.    . traZODone (DESYREL) 50 MG tablet Take 0.5-1 tablets (25-50 mg total) by mouth at bedtime as needed for sleep. 30 tablet 1  . gabapentin (NEURONTIN) 300 MG capsule Take 1 capsule (300 mg total) at bedtime by mouth. 30 capsule 2  . ketoconazole (NIZORAL) 2 %  cream Apply 1 application daily topically. To rash between toes 15 g 0  . sitaGLIPtin (JANUVIA) 50 MG tablet Take 1 tablet (50 mg total) by mouth daily. 30 tablet 2  . diclofenac sodium (VOLTAREN) 1 % GEL Apply 4 g topically 4 (four) times daily. (Patient not taking: Reported on 11/13/2017) 100 g 3  . Elastic Bandages & Supports (MEDICAL COMPRESSION SOCKS) MISC 1 each by Does not apply route daily. 25-30 mm Hg pressure (Patient not taking: Reported on 12/04/2016) 2 each 0  . Glucosamine-Chondroitin 750-600 MG TABS Take 2 tablets by mouth daily. (Patient not taking: Reported on 11/13/2017)  0  . Multiple Vitamins-Minerals (OCUVITE ADULT 50+) CAPS Take 1 capsule daily by mouth. Take with meal. (Patient not taking: Reported  on 11/13/2017) 30 capsule 11  . naproxen (NAPROSYN) 500 MG tablet Take 1 tablet (500 mg total) by mouth 2 (two) times daily with a meal. (Patient not taking: Reported on 11/13/2017) 30 tablet 0  . polyethylene glycol powder (GLYCOLAX/MIRALAX) powder Take 17 g by mouth daily. (Patient not taking: Reported on 12/04/2016) 3350 g 1  . Turmeric 500 MG CAPS Take 500 mg by mouth daily. (Patient not taking: Reported on 01/07/2017)     Facility-Administered Medications Prior to Visit  Medication Dose Route Frequency Provider Last Rate Last Dose  . bacitracin ointment 1 application  1 application Topical Once Alfonse Spruce, FNP      . insulin aspart (novoLOG) injection 12 Units  12 Units Subcutaneous Once Daelyn Pettaway R, FNP        ROS Review of Systems  Constitutional: Negative.   Respiratory: Negative.   Cardiovascular: Negative.   Skin: Positive for wound.       Skin pruritis  Neurological:       Feet paresthesias  Psychiatric/Behavioral: Negative for suicidal ideas.   Objective:  BP 126/79 (BP Location: Left Arm, Patient Position: Sitting, Cuff Size: Normal)   Pulse 99   Temp 98.5 F (36.9 C) (Oral)   Resp 18   Ht 5\' 3"  (1.6 m)   Wt 205 lb (93 kg)   SpO2 99%   BMI 36.31 kg/m   BP/Weight 01/09/2018 12/26/2017 32/67/1245  Systolic BP 809 983 382  Diastolic BP 79 93 76  Wt. (Lbs) 205 207.4 -  BMI 36.31 36.74 -   Physical Exam  Constitutional: She appears well-developed and well-nourished.  Eyes: Conjunctivae are normal. Pupils are equal, round, and reactive to light.  Cardiovascular: Normal rate, regular rhythm, normal heart sounds and intact distal pulses.  Pulmonary/Chest: Effort normal and breath sounds normal.  Abdominal: Soft. Bowel sounds are normal.  Musculoskeletal:       Feet:  Skin: Skin Nancy [name] warm and dry. Lesion (right great toe) and rash (bilateral hands) noted.  Nursing note and vitals reviewed.    Assessment & Plan:   1. Uncontrolled type 2  diabetes mellitus with diabetic neuropathy, with long-term current use of insulin ([employer]januvia. Start checking CBG's . Bring glucometer or blood glucose log to next office visit. Diet and exercise interventions discussed. - Glucose (CBG) - gabapentin (NEURONTIN) 300 MG capsule; Take 1 capsule (300 mg total) by mouth at bedtime.  Dispense: 30 capsule; Refill: 2 - sitaGLIPtin (JANUVIA) 100 MG tablet; Take 1 tablet (100 mg total) by mouth daily.  Dispense: 30 tablet; Refill: 2  2. Tinea pedis of both feet  - ketoconazole (NIZORAL) 2 % cream; Apply 1 application topically daily. To rash between toes  Dispense: 15 g; Refill: 0  3. Encounter for wound re-check Almost healed. No signs of infection. - neomycin-bacitracin-polymyxin (NEOSPORIN) ointment  4. Intertriginous candidiasis   - nystatin (MYCOSTATIN/NYSTOP) powder; Apply topically 3 (three) times daily. Apply to affected underarm areas.  Dispense: 15 g; Refill: 0  5. Eczema of both hands   - triamcinolone (KENALOG) 0.025 % ointment; Apply 1 application topically 2 (two) times daily as needed. Apply to affected areas of  hands.  Dispense: 30 g; Refill: 0 - Skin Protectants, Misc. (EUCERIN) cream; Apply topically as needed for dry skin.  Dispense: 454 g; Refill: 0  6. Healthcare maintenance   - MM SCREENING BREAST TOMO BILATERAL; Future - Fecal occult blood, imunochemical([facility])       Follow-up: Return in about 2 weeks (around 01/23/2018) for DM .   Alfonse Spruce FNP

## 2018-01-23 ENCOUNTER — Ambulatory Visit: Payer: Self-pay | Admitting: Family Medicine

## 2018-01-30 ENCOUNTER — Ambulatory Visit: Payer: Self-pay | Admitting: Family Medicine

## 2018-02-03 ENCOUNTER — Ambulatory Visit: Payer: Self-pay | Attending: Family Medicine | Admitting: Family Medicine

## 2018-02-03 ENCOUNTER — Ambulatory Visit: Payer: Self-pay | Admitting: Licensed Clinical Social Worker

## 2018-02-03 ENCOUNTER — Encounter: Payer: Self-pay | Admitting: Family Medicine

## 2018-02-03 VITALS — BP 161/99 | HR 102 | Temp 97.9°F | Ht 63.0 in | Wt 211.6 lb

## 2018-02-03 DIAGNOSIS — G4700 Insomnia, unspecified: Secondary | ICD-10-CM | POA: Insufficient documentation

## 2018-02-03 DIAGNOSIS — M5441 Lumbago with sciatica, right side: Secondary | ICD-10-CM

## 2018-02-03 DIAGNOSIS — H3530 Unspecified macular degeneration: Secondary | ICD-10-CM

## 2018-02-03 DIAGNOSIS — Z79899 Other long term (current) drug therapy: Secondary | ICD-10-CM | POA: Insufficient documentation

## 2018-02-03 DIAGNOSIS — M479 Spondylosis, unspecified: Secondary | ICD-10-CM | POA: Insufficient documentation

## 2018-02-03 DIAGNOSIS — IMO0002 Reserved for concepts with insufficient information to code with codable children: Secondary | ICD-10-CM

## 2018-02-03 DIAGNOSIS — I10 Essential (primary) hypertension: Secondary | ICD-10-CM

## 2018-02-03 DIAGNOSIS — F418 Other specified anxiety disorders: Secondary | ICD-10-CM

## 2018-02-03 DIAGNOSIS — M542 Cervicalgia: Secondary | ICD-10-CM

## 2018-02-03 DIAGNOSIS — E1165 Type 2 diabetes mellitus with hyperglycemia: Secondary | ICD-10-CM

## 2018-02-03 DIAGNOSIS — E1149 Type 2 diabetes mellitus with other diabetic neurological complication: Secondary | ICD-10-CM

## 2018-02-03 DIAGNOSIS — Z794 Long term (current) use of insulin: Secondary | ICD-10-CM | POA: Insufficient documentation

## 2018-02-03 DIAGNOSIS — Z791 Long term (current) use of non-steroidal anti-inflammatories (NSAID): Secondary | ICD-10-CM | POA: Insufficient documentation

## 2018-02-03 DIAGNOSIS — G8929 Other chronic pain: Secondary | ICD-10-CM

## 2018-02-03 DIAGNOSIS — M5126 Other intervertebral disc displacement, lumbar region: Secondary | ICD-10-CM

## 2018-02-03 LAB — GLUCOSE, POCT (MANUAL RESULT ENTRY): POC Glucose: 137 mg/dl — AB (ref 70–99)

## 2018-02-03 MED ORDER — BUPROPION HCL ER (SR) 150 MG PO TB12: 150.0000 mg | ORAL_TABLET | Freq: Two times a day (BID) | ORAL | 2 refills | Status: DC

## 2018-02-03 MED ORDER — CYCLOBENZAPRINE HCL 5 MG PO TABS: 5.0000 mg | ORAL_TABLET | Freq: Two times a day (BID) | ORAL | 1 refills | Status: DC | PRN

## 2018-02-03 MED ORDER — LISINOPRIL 40 MG PO TABS: 40.0000 mg | ORAL_TABLET | Freq: Every day | ORAL | 2 refills | Status: DC

## 2018-02-03 MED ORDER — DULOXETINE HCL 20 MG PO CPEP
20.0000 mg | ORAL_CAPSULE | Freq: Every day | ORAL | 2 refills | Status: DC
Start: 2018-02-03 — End: 2019-08-27

## 2018-02-03 MED ORDER — EPLERENONE 50 MG PO TABS: 50.0000 mg | ORAL_TABLET | Freq: Every day | ORAL | 3 refills | Status: DC

## 2018-02-03 MED ORDER — HYDRALAZINE HCL 100 MG PO TABS: 100.0000 mg | ORAL_TABLET | Freq: Three times a day (TID) | ORAL | 2 refills | Status: DC

## 2018-02-03 MED ORDER — ACETAMINOPHEN 500 MG PO TABS: 1000.0000 mg | ORAL_TABLET | Freq: Three times a day (TID) | ORAL | 2 refills | Status: DC | PRN

## 2018-02-03 MED ORDER — AMLODIPINE BESYLATE 10 MG PO TABS: 10.0000 mg | ORAL_TABLET | Freq: Every day | ORAL | 2 refills | Status: DC

## 2018-02-03 MED FILL — ?CYCLOBENZAPRINE 5MG TABLET: 5 | 30 days supply | Qty: 60 | Fill #0

## 2018-02-03 MED FILL — AMLODIPINE BESYLATE 10 MG T: 10 | 30 days supply | Qty: 30 | Fill #0

## 2018-02-03 MED FILL — DULoxetine HCL 20 MG CPEP: 20 | 30 days supply | Qty: 30 | Fill #0

## 2018-02-03 MED FILL — BUPROPION SR 150 MG TABLET: 150 | 30 days supply | Qty: 60 | Fill #0

## 2018-02-03 MED FILL — EPLERENONE 50 MG TABS: 50 | 30 days supply | Qty: 30 | Fill #0

## 2018-02-03 MED FILL — hydrALAZINE HCL 100 MG TABS: 100 | 30 days supply | Qty: 90 | Fill #0

## 2018-02-03 MED FILL — LISINOPRIL 40 MG TAB: 40 | 30 days supply | Qty: 30 | Fill #0

## 2018-02-03 NOTE — Progress Notes (Signed)
Subjective:  Patient ID: Jeronimo Norma, female    DOB: 1961-09-02  Age: 57 y.o. MRN: [id number]  CC: Diabetes   HPI EDITHE DOBBIN presents for diabetes/ HTN follow up. History of avulsion of skin of toe after she reported attempting to cut her toenail and clipping her skin about 1 month ago. She reports completing course of antibiotic therapy. No drainage, swelling, or decreased sensation to toe. Diabetes Mellitus: History of dm neuropathy. Controlled with use of gabapentin. Depression with anxiety:  She complains of anhedonia, depressed mood, insomnia and irritability. Onset was approximately over 1 year ago, gradually worsening since that time.  She denies current suicidal and homicidal plan or intent. .Possible organic causes contributing are: none.  Risk factors: negative life event : job loss in March, home was foreclosed on, mother died a few months after, history of panic attacks. She is currently going through divorce process. She denies any SI/HI.  She is agreeable to speaking with LCSW at this time. She is adherent with taking Wellbutrin. She reports moderate improvement of symptoms however is requesting medication increase. History of chronic back and neck pain. Denies any history of injury. Associated symptoms include paresthesia of right leg. She denies any weakness or incontinence. 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 Cervical xray in 2017 showed  disc degeneration with diffuse uncinate spurring at C3-4, C4-5,C5-6, and C6-7; cervical spondylosis C3 through C7 of a moderate degree.  CHRONIC DIABETES  Disease Monitoring  Blood Sugar Ranges: 168-239 at home. She reports CBG's higher in the am. She does not bring glucometer or blood glucose log with her.  Polyuria: no   Visual problems: history of macular degeneration.    Medication  Compliance: yes.  Medication Side Effects  Hypoglycemia: no   Preventative Health Care  Eye Exam: no  Foot Exam: yes  Diet pattern: carb modified  Exercise: no   CHRONIC HYPERTENSION  Disease Monitoring  Blood pressure range: She does not check BP at home.  Chest pain: no   Dyspnea: no   Claudication: no   Medication compliance: yes.   Medication Side Effects  Lightheadedness: no   Urinary frequency: no   Edema: no     Preventative Healthcare:  Exercise: no   Diet Pattern: lower sodium  Salt Restriction: no        Outpatient Medications Prior to Visit  Medication Sig Dispense Refill  . atorvastatin (LIPITOR) 40 MG tablet Take 1 tablet (40 mg total) daily by mouth. 90 tablet 1  . diclofenac sodium (VOLTAREN) 1 % GEL Apply 4 g topically 4 (four) times daily. 100 g 3  . Elastic Bandages & Supports (MEDICAL COMPRESSION SOCKS) MISC 1 each by Does not apply route daily. 25-30 mm Hg pressure 2 each 0  . famotidine (PEPCID) 20 MG tablet Take 1 tablet (20 mg total) 2 (two) times daily before a meal by mouth. As needed. 30 tablet 11  . gabapentin (NEURONTIN) 300 MG capsule Take 1 capsule (300 mg total) by mouth at bedtime. 30 capsule 2  . glipiZIDE (GLUCOTROL) 5 MG tablet Take 1 tablet (5 mg total) by mouth 2 (two) times daily before a meal. 60 tablet 2  . hydrocortisone valerate ointment (WESTCORT) 0.2 % Apply 1 application topically 2 (two) times daily. 45 g 0  . Insulin Glargine (LANTUS SOLOSTAR) 100 UNIT/ML Solostar Pen Inject 50 Units into the skin daily.  5 pen 5  . Insulin Pen Needle (PEN NEEDLES) 32G X 4 MM MISC 1 each by Does not apply route 4 (four) times daily. 120 each 11  . ketoconazole (NIZORAL) 2 % cream Apply 1 application topically daily. To rash between toes 15 g 0  . magnesium chloride (SLOW-MAG) 64 MG TBEC SR tablet Take 1 tablet by mouth.    . nystatin (MYCOSTATIN/NYSTOP) powder Apply topically 3 (three) times daily. Apply to affected underarm areas. 15 g 0    . polyethylene glycol powder (GLYCOLAX/MIRALAX) powder Take 17 g by mouth daily. 3350 g 1  . sitaGLIPtin (JANUVIA) 100 MG tablet Take 1 tablet (100 mg total) by mouth daily. 30 tablet 2  . Skin Protectants, Misc. (EUCERIN) cream Apply topically as needed for dry skin. 454 g 0  . traZODone (DESYREL) 50 MG tablet Take 0.5-1 tablets (25-50 mg total) by mouth at bedtime as needed for sleep. 30 tablet 1  . triamcinolone (KENALOG) 0.025 % ointment Apply 1 application topically 2 (two) times daily as needed. Apply to affected areas of  hands. 30 g 0  . acetaminophen (TYLENOL) 500 MG tablet Take 1,000 mg by mouth every 6 (six) hours as needed for mild pain.    Marland Kitchen amLODipine (NORVASC) 5 MG tablet Take 1 tablet (5 mg total) by mouth daily. 30 tablet 2  . buPROPion (WELLBUTRIN SR) 150 MG 12 hr tablet Take 1 tablet (150 mg total) by mouth 2 (two) times daily. 60 tablet 2  . cyclobenzaprine (FLEXERIL) 10 MG tablet Take 0.5 tablets (5 mg total) by mouth 2 (two) times daily as needed for muscle spasms. 60 tablet 1  . eplerenone (INSPRA) 50 MG tablet Take 1 tablet (50 mg total) daily by mouth. 30 tablet 3  . hydrALAZINE (APRESOLINE) 100 MG tablet Take 1 tablet (100 mg total) every 8 (eight) hours by mouth. 90 tablet 2  . lisinopril (PRINIVIL,ZESTRIL) 40 MG tablet Take 1 tablet (40 mg total) daily by mouth. 30 tablet 2  . cephALEXin (KEFLEX) 500 MG capsule Take 1 capsule (500 mg total) by mouth 4 (four) times daily. (Patient not taking: Reported on 02/03/2018) 20 capsule 0  . Glucosamine-Chondroitin 750-600 MG TABS Take 2 tablets by mouth daily. (Patient not taking: Reported on 11/13/2017)  0  . insulin aspart (NOVOLOG) 100 UNIT/ML FlexPen CBG Less than 70,  Do not give insulin. CBG 70-120 =   0 units CBG 121-140 = 2 units CBG 141-180 =  4 units CBG 181-220 =  6 units CBG  221-260 = 8 units CBG 261-300 = 10 units CBG 301-350 = 12 units CBG 351-400 = 14 units Greater than 400 = 16 units. Recheck CBG in 30  minutes. Contact office. (Patient not taking: Reported on 02/03/2018) 15 mL 11  . Multiple Vitamins-Minerals (OCUVITE ADULT 50+) CAPS Take 1 capsule daily by mouth. Take with meal. (Patient not taking: Reported on 11/13/2017) 30 capsule 11  . naproxen (NAPROSYN) 500 MG tablet Take 1 tablet (500 mg total) by mouth 2 (two) times daily with a meal. (Patient not taking: Reported on 11/13/2017) 30 tablet 0  . Turmeric 500 MG CAPS Take 500 mg by mouth daily. (Patient not taking: Reported on 01/07/2017)     Facility-Administered Medications Prior to Visit  Medication Dose Route Frequency Provider Last Rate Last Dose  . bacitracin ointment 1 application  1 application Topical Once Alfonse Spruce, FNP      . insulin aspart (novoLOG) injection 12 Units  12 Units  Subcutaneous Once Alfonse Spruce, FNP      . neomycin-bacitracin-polymyxin (NEOSPORIN) ointment   Topical Once Rosanne Wohlfarth R, FNP        ROS Review of Systems  Constitutional: Negative.   Eyes: Visual disturbance: history of macular degeneration.  Respiratory: Negative.   Cardiovascular: Negative.   Musculoskeletal: Positive for arthralgias and back pain.  Skin: Negative.   Neurological: Negative.   Psychiatric/Behavioral: Positive for dysphoric mood. Negative for suicidal ideas.   Objective:  BP (!) 161/99   Pulse (!) 102   Temp 97.9 F (36.6 C) (Oral)   Ht 5\' 3"  (1.6 m)   Wt 211 lb 9.6 oz (96 kg)   SpO2 98%   BMI 37.48 kg/m   BP/Weight 02/03/2018 01/09/2018 06/11/9484  Systolic BP 462 703 500  Diastolic BP 99 79 93  Wt. (Lbs) 211.6 205 207.4  BMI 37.48 36.31 36.74   Physical Exam  Constitutional: She appears well-developed and well-nourished.  Eyes: Conjunctivae are normal. Pupils are equal, round, and reactive to light.  Cardiovascular: Normal rate, regular rhythm, normal heart sounds and intact distal pulses.  Pulmonary/Chest: Effort normal and breath sounds normal.  Abdominal: Soft. Bowel sounds are  normal.  Musculoskeletal:       Lumbar back: She exhibits pain.       Feet:  Skin: Skin is warm and dry.  Nursing note and vitals reviewed.    Assessment & Plan:   1. Type II diabetes mellitus with neurological manifestations, uncontrolled ([place])  - POCT glucose (manual entry) - Ambulatory referral to Ophthalmology - DULoxetine (CYMBALTA) 20 MG capsule; Take 1 capsule (20 mg total) by mouth daily.  Dispense: 30 capsule; Refill: 2  2. Depression with anxiety LSCW spoke with patient and provided counseling resources. Cymbalta added. - buPROPion (WELLBUTRIN SR) 150 MG 12 hr tablet; Take 1 tablet (150 mg total) by mouth 2 (two) times daily.  Dispense: 60 tablet; Refill: 2 - DULoxetine (CYMBALTA) 20 MG capsule; Take 1 capsule (20 mg total) by mouth daily.  Dispense: 30 capsule; Refill: 2  3. Essential hypertension  - hydrALAZINE (APRESOLINE) 100 MG tablet; Take 1 tablet (100 mg total) by mouth every 8 (eight) hours.  Dispense: 90 tablet; Refill: 2 - lisinopril (PRINIVIL,ZESTRIL) 40 MG tablet; Take 1 tablet (40 mg total) by mouth daily.  Dispense: 30 tablet; Refill: 2 - amLODipine (NORVASC) 10 MG tablet; Take 1 tablet (10 mg total) by mouth daily.  Dispense: 30 tablet; Refill: 2 - eplerenone (INSPRA) 50 MG tablet; Take 1 tablet (50 mg total) by mouth daily.  Dispense: 30 tablet; Refill: 3  4. Macular degeneration of both eyes, unspecified type  - Ambulatory referral to Ophthalmology  5. Chronic cervical pain  - cyclobenzaprine (FLEXERIL) 5 MG tablet; Take 1 tablet (5 mg total) by mouth 2 (two) times daily as needed for muscle spasms.  Dispense: 60 tablet; Refill: 1 - acetaminophen (TYLENOL) 500 MG tablet; Take 2 tablets (1,000 mg total) by mouth every 8 (eight) hours as needed for mild pain or moderate pain.  Dispense: 30 tablet; Refill: 2  6. Chronic bilateral low back pain with right-sided sciatica  - Ambulatory referral to Orthopedics - cyclobenzaprine (FLEXERIL) 5 MG tablet;  Take 1 tablet (5 mg total) by mouth 2 (two) times daily as needed for muscle spasms.  Dispense: 60 tablet; Refill: 1 - acetaminophen (TYLENOL) 500 MG tablet; Take 2 tablets (1,000 mg total) by mouth every 8 (eight) hours as needed for mild pain or moderate pain.  Dispense: 30 tablet; Refill: 2  7. Lumbar disc herniation  - Ambulatory referral to Orthopedics   Follow-up: Return in about 6 weeks (around 03/17/2018) for DM/ Depression.   Alfonse Spruce FNP

## 2018-03-06 ENCOUNTER — Ambulatory Visit: Payer: Self-pay | Attending: Internal Medicine | Admitting: Physician Assistant

## 2018-03-06 VITALS — BP 135/89 | HR 109 | Temp 98.6°F | Resp 16 | Ht 63.0 in | Wt 207.2 lb

## 2018-03-06 DIAGNOSIS — I10 Essential (primary) hypertension: Secondary | ICD-10-CM | POA: Insufficient documentation

## 2018-03-06 DIAGNOSIS — E1140 Type 2 diabetes mellitus with diabetic neuropathy, unspecified: Secondary | ICD-10-CM

## 2018-03-06 DIAGNOSIS — Z79899 Other long term (current) drug therapy: Secondary | ICD-10-CM | POA: Insufficient documentation

## 2018-03-06 DIAGNOSIS — Z760 Encounter for issue of repeat prescription: Secondary | ICD-10-CM | POA: Insufficient documentation

## 2018-03-06 DIAGNOSIS — M542 Cervicalgia: Secondary | ICD-10-CM | POA: Insufficient documentation

## 2018-03-06 DIAGNOSIS — F329 Major depressive disorder, single episode, unspecified: Secondary | ICD-10-CM | POA: Insufficient documentation

## 2018-03-06 DIAGNOSIS — G8929 Other chronic pain: Secondary | ICD-10-CM | POA: Insufficient documentation

## 2018-03-06 DIAGNOSIS — Z885 Allergy status to narcotic agent status: Secondary | ICD-10-CM | POA: Insufficient documentation

## 2018-03-06 DIAGNOSIS — F418 Other specified anxiety disorders: Secondary | ICD-10-CM

## 2018-03-06 DIAGNOSIS — E1149 Type 2 diabetes mellitus with other diabetic neurological complication: Secondary | ICD-10-CM | POA: Insufficient documentation

## 2018-03-06 DIAGNOSIS — E1143 Type 2 diabetes mellitus with diabetic autonomic (poly)neuropathy: Secondary | ICD-10-CM | POA: Insufficient documentation

## 2018-03-06 DIAGNOSIS — IMO0002 Reserved for concepts with insufficient information to code with codable children: Secondary | ICD-10-CM

## 2018-03-06 DIAGNOSIS — Z794 Long term (current) use of insulin: Secondary | ICD-10-CM | POA: Insufficient documentation

## 2018-03-06 DIAGNOSIS — K589 Irritable bowel syndrome without diarrhea: Secondary | ICD-10-CM | POA: Insufficient documentation

## 2018-03-06 DIAGNOSIS — E785 Hyperlipidemia, unspecified: Secondary | ICD-10-CM | POA: Insufficient documentation

## 2018-03-06 DIAGNOSIS — M5441 Lumbago with sciatica, right side: Secondary | ICD-10-CM | POA: Insufficient documentation

## 2018-03-06 DIAGNOSIS — M79674 Pain in right toe(s): Secondary | ICD-10-CM | POA: Insufficient documentation

## 2018-03-06 DIAGNOSIS — E1165 Type 2 diabetes mellitus with hyperglycemia: Secondary | ICD-10-CM

## 2018-03-06 DIAGNOSIS — K3184 Gastroparesis: Secondary | ICD-10-CM | POA: Insufficient documentation

## 2018-03-06 LAB — GLUCOSE, POCT (MANUAL RESULT ENTRY): POC Glucose: 280 mg/dl — AB (ref 70–99)

## 2018-03-06 LAB — POCT GLYCOSYLATED HEMOGLOBIN (HGB A1C): Hemoglobin A1C: 11.4

## 2018-03-06 MED ORDER — AMLODIPINE BESYLATE 10 MG PO TABS: 10.0000 mg | ORAL_TABLET | Freq: Every day | ORAL | 2 refills | Status: DC

## 2018-03-06 MED ORDER — INSULIN GLARGINE 100 UNIT/ML SOLOSTAR PEN: 50.0000 [IU] | PEN_INJECTOR | Freq: Every day | SUBCUTANEOUS | 5 refills | Status: DC

## 2018-03-06 MED ORDER — GLIPIZIDE 5 MG PO TABS: 5.0000 mg | ORAL_TABLET | Freq: Two times a day (BID) | ORAL | 2 refills | Status: DC

## 2018-03-06 MED ORDER — GABAPENTIN 300 MG PO CAPS: 300.0000 mg | ORAL_CAPSULE | Freq: Every day | ORAL | 2 refills | Status: DC

## 2018-03-06 MED ORDER — TRAZODONE HCL 50 MG PO TABS: 25.0000 mg | ORAL_TABLET | Freq: Every evening | ORAL | 1 refills | Status: DC | PRN

## 2018-03-06 MED ORDER — LISINOPRIL 40 MG PO TABS: 40.0000 mg | ORAL_TABLET | Freq: Every day | ORAL | 2 refills | Status: DC

## 2018-03-06 MED ORDER — CYCLOBENZAPRINE HCL 5 MG PO TABS: 5.0000 mg | ORAL_TABLET | Freq: Two times a day (BID) | ORAL | 1 refills | Status: DC | PRN

## 2018-03-06 MED ORDER — SITAGLIPTIN PHOSPHATE 100 MG PO TABS: 100.0000 mg | ORAL_TABLET | Freq: Every day | ORAL | 2 refills | Status: DC

## 2018-03-06 MED FILL — LISINOPRIL 40 MG TAB: 40 | 30 days supply | Qty: 30 | Fill #1

## 2018-03-06 MED FILL — CYCLOBENZAPRINE 5 MG TABLET: 5 | 30 days supply | Qty: 60 | Fill #1

## 2018-03-06 MED FILL — glipiZIDE 5 MG TABS: 5 | 30 days supply | Qty: 60 | Fill #1

## 2018-03-06 MED FILL — $JANUVIA 100 MG TABLET: 100 | 30 days supply | Qty: 30 | Fill #0

## 2018-03-06 MED FILL — GABAPENTIN 300 MG CAPSULE: 300 | 30 days supply | Qty: 30 | Fill #1

## 2018-03-06 MED FILL — $LANTUS SOLOSTAR 100 UNITS/: 100 | 30 days supply | Qty: 15 | Fill #1

## 2018-03-06 MED FILL — AMLODIPINE BESYLATE 10 MG T: 10 | 30 days supply | Qty: 30 | Fill #1

## 2018-03-06 MED FILL — traZODone HCL 50 MG TABS: 50 | 30 days supply | Qty: 30 | Fill #1

## 2018-03-06 NOTE — Progress Notes (Signed)
Pt is here for her right big toe black discoloration.  Pt stated she had pulled hanging nail before and it healed fine.  No pain.

## 2018-03-06 NOTE — Progress Notes (Signed)
Patient ID: Nancy [name], female   DOB: Feb 24, 1961, 57 y.o.   MRN: [id number]   Nancy [name], is a 57 y.o. female  [id number]  [id number]  DOB - 05-20-1961  Subjective:  Chief Complaint and HPI: Nancy [name] is a 57 y.o. female here today for med RF and to check on R great toe where she had an injury.  She had nails of B great toes removed many many years ago.  Recently had infection and took antibiotics and the skin of the tip of the toe looks a little dark and feels firm although it has been progressively improving.  She needs RF on all her meds.  She is not taking her glipizide in the evenings.  She takes her lantus most of the time.  She has been out of [place] for a few days.    Taking BP meds.  trazadone is helping for sleep but she feels like she might need a higher dose.    ROS:   Constitutional:  No f/c, No night sweats, No unexplained weight loss. EENT:  No vision changes, No blurry vision, No hearing changes. No mouth, throat, or ear problems.  Respiratory: No cough, No SOB Cardiac: No CP, no palpitations GI:  No abd pain, No N/V/D. GU: No Urinary s/sx Musculoskeletal: No joint pain Neuro: No headache, no dizziness, no motor weakness.  Skin: No rash Endocrine:  No polydipsia. No polyuria.  Psych: Denies SI/HI  No problems updated.  ALLERGIES: Allergies  Allergen Reactions  . Hydrocodone Other (See Comments)    Upset stomach  . Metformin And Related Nausea And Vomiting and Other (See Comments)    Stomach pains   . Sulfur Hives    PAST MEDICAL HISTORY: Past Medical History:  Diagnosis Date  . Anemia 2006  . Depression 2014   previously on amitryptiline   . Diabetes mellitus with neurological manifestation ([place]) 2006  . Fracture of left ankle 1997   . Gastroparesis 07/2016  . HOH (hard of hearing) 2004   . Hyperlipidemia 2006  . Hypertension 2006  . IBS (irritable bowel syndrome) 2002   . Leukopenia 2015   . Shingles 2009   .  Thyroid nodule 2004    MEDICATIONS AT HOME: Prior to Admission medications   Medication Sig Start Date End Date Taking? Authorizing Provider  acetaminophen (TYLENOL) 500 MG tablet Take 2 tablets (1,000 mg total) by mouth every 8 (eight) hours as needed for mild pain or moderate pain. 02/03/18  Yes Hairston, Maylon Peppers, FNP  amLODipine (NORVASC) 10 MG tablet Take 1 tablet (10 mg total) by mouth daily. 03/06/18  Yes Freeman Caldron M, PA-C  atorvastatin (LIPITOR) 40 MG tablet Take 1 tablet (40 mg total) daily by mouth. 10/29/17  Yes Hairston, Mandesia R, FNP  buPROPion (WELLBUTRIN SR) 150 MG 12 hr tablet Take 1 tablet (150 mg total) by mouth 2 (two) times daily. 02/03/18  Yes Hairston, Maylon Peppers, FNP  cyclobenzaprine (FLEXERIL) 5 MG tablet Take 1 tablet (5 mg total) by mouth 2 (two) times daily as needed for muscle spasms. 03/06/18  Yes Argentina Donovan, PA-C  diclofenac sodium (VOLTAREN) 1 % GEL Apply 4 g topically 4 (four) times daily. 09/14/16  Yes Funches, Josalyn, MD  DULoxetine (CYMBALTA) 20 MG capsule Take 1 capsule (20 mg total) by mouth daily. 02/03/18  Yes Hairston, Maylon Peppers, FNP  famotidine (PEPCID) 20 MG tablet Take 1 tablet (20 mg total) 2 (two) times daily before a meal by mouth. As needed.  10/29/17  Yes Hairston, Maylon Peppers, FNP  gabapentin (NEURONTIN) 300 MG capsule Take 1 capsule (300 mg total) by mouth at bedtime. 03/06/18  Yes McClung, Angela M, PA-C  glipiZIDE (GLUCOTROL) 5 MG tablet Take 1 tablet (5 mg total) by mouth 2 (two) times daily before a meal. 03/06/18  Yes McClung, Angela M, PA-C  hydrALAZINE (APRESOLINE) 100 MG tablet Take 1 tablet (100 mg total) by mouth every 8 (eight) hours. 02/03/18  Yes Hairston, Maylon Peppers, FNP  Insulin Glargine (LANTUS SOLOSTAR) 100 UNIT/ML Solostar Pen Inject 50 Units into the skin daily. 03/06/18  Yes McClung, Angela M, PA-C  Insulin Pen Needle (PEN NEEDLES) 32G X 4 MM MISC 1 each by Does not apply route 4 (four) times daily. 12/04/16  Yes Funches,  Josalyn, MD  ketoconazole (NIZORAL) 2 % cream Apply 1 application topically daily. To rash between toes 01/09/18  Yes Hairston, Mandesia R, FNP  lisinopril (PRINIVIL,ZESTRIL) 40 MG tablet Take 1 tablet (40 mg total) by mouth daily. 03/06/18  Yes Freeman Caldron M, PA-C  magnesium chloride (SLOW-MAG) 64 MG TBEC SR tablet Take 1 tablet by mouth.   Yes [provider]  sitaGLIPtin (JANUVIA) 100 MG tablet Take 1 tablet (100 mg total) by mouth daily. 03/06/18  Yes McClung, Dionne Bucy, PA-C  traZODone (DESYREL) 50 MG tablet Take 0.5-1 tablets (25-50 mg total) by mouth at bedtime as needed for sleep. 03/06/18  Yes Argentina Donovan, PA-C  Elastic Bandages & Supports (MEDICAL COMPRESSION SOCKS) MISC 1 each by Does not apply route daily. 25-30 mm Hg pressure Patient not taking: Reported on 03/06/2018 09/14/16   Boykin Nearing, MD  eplerenone (INSPRA) 50 MG tablet Take 1 tablet (50 mg total) by mouth daily. Patient not taking: Reported on 03/06/2018 02/03/18   Alfonse Spruce, FNP  Glucosamine-Chondroitin 750-600 MG TABS Take 2 tablets by mouth daily. Patient not taking: Reported on 11/13/2017 12/04/16   Boykin Nearing, MD  hydrocortisone valerate ointment (WESTCORT) 0.2 % Apply 1 application topically 2 (two) times daily. Patient not taking: Reported on 03/06/2018 10/25/16   Boykin Nearing, MD  Multiple Vitamins-Minerals (OCUVITE ADULT 50+) CAPS Take 1 capsule daily by mouth. Take with meal. Patient not taking: Reported on 11/13/2017 10/29/17   Alfonse Spruce, FNP  naproxen (NAPROSYN) 500 MG tablet Take 1 tablet (500 mg total) by mouth 2 (two) times daily with a meal. Patient not taking: Reported on 03/06/2018 12/04/16   Boykin Nearing, MD  nystatin (MYCOSTATIN/NYSTOP) powder Apply topically 3 (three) times daily. Apply to affected underarm areas. Patient not taking: Reported on 03/06/2018 01/09/18   Alfonse Spruce, FNP  polyethylene glycol powder (GLYCOLAX/MIRALAX) powder Take 17 g by  mouth daily. 08/17/16   Boykin Nearing, MD  Skin Protectants, Misc. (EUCERIN) cream Apply topically as needed for dry skin. 01/09/18   Alfonse Spruce, FNP  triamcinolone (KENALOG) 0.025 % ointment Apply 1 application topically 2 (two) times daily as needed. Apply to affected areas of  hands. Patient not taking: Reported on 03/06/2018 01/09/18   Alfonse Spruce, FNP  Turmeric 500 MG CAPS Take 500 mg by mouth daily. Patient not taking: Reported on 01/07/2017 12/04/16   Boykin Nearing, MD  Insulin NPH Isophane & Regular (RELION 70/30 [place]) Inject 35 Units into the skin 2 (two) times daily.  08/17/14  [provider]     Objective:  EXAM:   Vitals:   03/06/18 1439  BP: 135/89  Pulse: (!) 109  Resp: 16  Temp: 98.6 F (  37 C)  TempSrc: Oral  SpO2: 100%  Weight: 207 lb 3.2 oz (94 kg)  Height: 5\' 3"  (1.6 m)    General appearance : A&OX3. NAD. Non-toxic-appearing HEENT: Atraumatic and Normocephalic.  PERRLA. EOM intact.   Neck: supple, no JVD. No cervical lymphadenopathy. No thyromegaly Chest/Lungs:  Breathing-non-labored, Good air entry bilaterally, breath sounds normal without rales, rhonchi, or wheezing  CVS: S1 S2 regular, no murmurs, gallops, rubs  Extremities: Bilateral Lower Ext shows no edema, both legs are warm to touch with = pulse throughout.  R great toe w/o gangrene or necrosis.  N-V intact.  There is some dark skin at the tip of where a natural nail would end.  She has polish on the skin.   Neurology:  CN II-XII grossly intact, Non focal.   Psych:  TP linear. J/I WNL. Normal speech. Appropriate eye contact and affect.  Skin:  No Rash  Data Review Lab Results  Component Value Date   HGBA1C 11.4 03/06/2018   HGBA1C 13.9 10/29/2017   HGBA1C 14.0 01/07/2017     Assessment & Plan   1. Uncontrolled type 2 diabetes mellitus with diabetic neuropathy, with long-term current use of insulin ([place]) Uncontrolled-but improved since 10/2017.  Needs to be compliant on  current regimen 100% before making additional changes.  Also, work on diabetic diet.  Discussed/counseled at length - Glucose (CBG) - POCT glycosylated hemoglobin (Hb A1C) - gabapentin (NEURONTIN) 300 MG capsule; Take 1 capsule (300 mg total) by mouth at bedtime.  Dispense: 30 capsule; Refill: 2 - sitaGLIPtin (JANUVIA) 100 MG tablet; Take 1 tablet (100 mg total) by mouth daily.  Dispense: 30 tablet; Refill: 2 - glipiZIDE (GLUCOTROL) 5 MG tablet; Take 1 tablet (5 mg total) by mouth 2 (two) times daily before a meal.  Dispense: 60 tablet; Refill: 2 - Ambulatory referral to Podiatry - Insulin Glargine (LANTUS SOLOSTAR) 100 UNIT/ML Solostar Pen; Inject 50 Units into the skin daily.  Dispense: 5 pen; Refill: 5  2. Chronic cervical pain - cyclobenzaprine (FLEXERIL) 5 MG tablet; Take 1 tablet (5 mg total) by mouth 2 (two) times daily as needed for muscle spasms.  Dispense: 60 tablet; Refill: 1  3. Chronic bilateral low back pain with right-sided sciatica - cyclobenzaprine (FLEXERIL) 5 MG tablet; Take 1 tablet (5 mg total) by mouth 2 (two) times daily as needed for muscle spasms.  Dispense: 60 tablet; Refill: 1  4. Essential hypertension Almost at goal.  Work on diet/exercise.  DASH diet. We have discussed target BP range and blood pressure goal. I have advised patient to check BP regularly and to call us back or report to clinic if the numbers are consistently higher than 140/90. We discussed the importance of compliance with medical therapy and DASH diet recommended, consequences of uncontrolled hypertension discussed.  - amLODipine (NORVASC) 10 MG tablet; Take 1 tablet (10 mg total) by mouth daily.  Dispense: 30 tablet; Refill: 2 - lisinopril (PRINIVIL,ZESTRIL) 40 MG tablet; Take 1 tablet (40 mg total) by mouth daily.  Dispense: 30 tablet; Refill: 2  5. Poor sleep - traZODone (DESYREL) 50 MG tablet; Take 0.5-1 tablets (25-50 mg total) by mouth at bedtime as needed for sleep.  Dispense: 30 tablet;  Refill: 1  She can increase to 1.5 tabs at bedtime to improve sleep.    6. Pain of toe of right foot No sign of necrosis or gangrene.  Will have her see podiatry for second opinion due to h/o matrix ablation - Ambulatory referral to Podiatry  Patient have been counseled extensively about nutrition and exercise  Return in about 3 months (around 06/06/2018) for assign new PCP:  DM and htn.  The patient was given clear instructions to go to ER or return to medical center if symptoms don't improve, worsen or new problems develop. The patient verbalized understanding. The patient was told to call to get lab results if they haven't heard anything in the next week.     Freeman Caldron, PA-C [facility] and [place], [place]   03/06/2018, 3:07 PM

## 2018-03-06 NOTE — Patient Instructions (Signed)
Check blood sugars fasting and at bedtime and record

## 2018-03-19 ENCOUNTER — Ambulatory Visit: Payer: Self-pay | Admitting: Nurse Practitioner

## 2018-04-18 ENCOUNTER — Ambulatory Visit: Payer: Self-pay | Attending: Internal Medicine | Admitting: Internal Medicine

## 2018-04-18 ENCOUNTER — Other Ambulatory Visit: Payer: Self-pay

## 2018-04-18 ENCOUNTER — Encounter: Payer: Self-pay | Admitting: Internal Medicine

## 2018-04-18 VITALS — BP 115/76 | HR 90 | Temp 98.5°F | Resp 18 | Ht 63.0 in | Wt 208.2 lb

## 2018-04-18 DIAGNOSIS — E669 Obesity, unspecified: Secondary | ICD-10-CM | POA: Insufficient documentation

## 2018-04-18 DIAGNOSIS — K219 Gastro-esophageal reflux disease without esophagitis: Secondary | ICD-10-CM | POA: Insufficient documentation

## 2018-04-18 DIAGNOSIS — R1312 Dysphagia, oropharyngeal phase: Secondary | ICD-10-CM | POA: Insufficient documentation

## 2018-04-18 DIAGNOSIS — E1142 Type 2 diabetes mellitus with diabetic polyneuropathy: Secondary | ICD-10-CM | POA: Insufficient documentation

## 2018-04-18 DIAGNOSIS — D490 Neoplasm of unspecified behavior of digestive system: Secondary | ICD-10-CM | POA: Insufficient documentation

## 2018-04-18 DIAGNOSIS — M545 Low back pain, unspecified: Secondary | ICD-10-CM

## 2018-04-18 DIAGNOSIS — E041 Nontoxic single thyroid nodule: Secondary | ICD-10-CM | POA: Insufficient documentation

## 2018-04-18 DIAGNOSIS — E1165 Type 2 diabetes mellitus with hyperglycemia: Secondary | ICD-10-CM | POA: Insufficient documentation

## 2018-04-18 DIAGNOSIS — M47896 Other spondylosis, lumbar region: Secondary | ICD-10-CM | POA: Insufficient documentation

## 2018-04-18 DIAGNOSIS — E1143 Type 2 diabetes mellitus with diabetic autonomic (poly)neuropathy: Secondary | ICD-10-CM | POA: Insufficient documentation

## 2018-04-18 DIAGNOSIS — H3530 Unspecified macular degeneration: Secondary | ICD-10-CM | POA: Insufficient documentation

## 2018-04-18 DIAGNOSIS — Z87891 Personal history of nicotine dependence: Secondary | ICD-10-CM | POA: Insufficient documentation

## 2018-04-18 DIAGNOSIS — E1140 Type 2 diabetes mellitus with diabetic neuropathy, unspecified: Secondary | ICD-10-CM

## 2018-04-18 DIAGNOSIS — F329 Major depressive disorder, single episode, unspecified: Secondary | ICD-10-CM | POA: Insufficient documentation

## 2018-04-18 DIAGNOSIS — IMO0002 Reserved for concepts with insufficient information to code with codable children: Secondary | ICD-10-CM

## 2018-04-18 DIAGNOSIS — G8929 Other chronic pain: Secondary | ICD-10-CM | POA: Insufficient documentation

## 2018-04-18 DIAGNOSIS — G4700 Insomnia, unspecified: Secondary | ICD-10-CM | POA: Insufficient documentation

## 2018-04-18 DIAGNOSIS — E785 Hyperlipidemia, unspecified: Secondary | ICD-10-CM | POA: Insufficient documentation

## 2018-04-18 DIAGNOSIS — K3184 Gastroparesis: Secondary | ICD-10-CM | POA: Insufficient documentation

## 2018-04-18 DIAGNOSIS — F419 Anxiety disorder, unspecified: Secondary | ICD-10-CM | POA: Insufficient documentation

## 2018-04-18 DIAGNOSIS — Z6839 Body mass index (BMI) 39.0-39.9, adult: Secondary | ICD-10-CM | POA: Insufficient documentation

## 2018-04-18 DIAGNOSIS — I10 Essential (primary) hypertension: Secondary | ICD-10-CM | POA: Insufficient documentation

## 2018-04-18 DIAGNOSIS — Z794 Long term (current) use of insulin: Secondary | ICD-10-CM | POA: Insufficient documentation

## 2018-04-18 DIAGNOSIS — Z79899 Other long term (current) drug therapy: Secondary | ICD-10-CM | POA: Insufficient documentation

## 2018-04-18 LAB — GLUCOSE, POCT (MANUAL RESULT ENTRY): POC GLUCOSE: 308 mg/dL — AB (ref 70–99)

## 2018-04-18 MED ORDER — OMEPRAZOLE 20 MG PO CPDR
20.0000 mg | DELAYED_RELEASE_CAPSULE | Freq: Every day | ORAL | 3 refills | Status: DC
Start: 2018-04-18 — End: 2018-09-24

## 2018-04-18 MED ORDER — MELOXICAM 15 MG PO TABS: 15.0000 mg | ORAL_TABLET | Freq: Every day | ORAL | 0 refills | Status: DC

## 2018-04-18 MED ORDER — GLIMEPIRIDE 4 MG PO TABS: 4.0000 mg | ORAL_TABLET | Freq: Every day | ORAL | 3 refills | Status: DC

## 2018-04-18 MED ORDER — INSULIN GLARGINE 100 UNIT/ML SOLOSTAR PEN
60.0000 [IU] | PEN_INJECTOR | Freq: Every day | SUBCUTANEOUS | 5 refills | Status: DC
Start: 2018-04-18 — End: 2019-05-04

## 2018-04-18 MED FILL — MELOXICAM 15 MG TABLET: 15 | 30 days supply | Qty: 30 | Fill #0

## 2018-04-18 MED FILL — GLIMEPIRIDE 4 MG TABS: 4 | 30 days supply | Qty: 30 | Fill #0

## 2018-04-18 MED FILL — OMEPRAZOLE 20 MG CAP: 20 | 30 days supply | Qty: 30 | Fill #0

## 2018-04-18 NOTE — Progress Notes (Signed)
Patient ID: Nancy [name], female    DOB: Sep 16, 1961  MRN: [id number]  CC: Follow-up   Subjective: Nancy [name] is a 57 y.o. female who presents for chronic ds management and to establish with me as PCP Her concerns today include:  History of diabetes type 2 with gastroparesis and peripheral neuropathy, HTN, HL, parotid tumor, depression/anxiety osteoarthritis of the lumbar spine  1.  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  Also has globus feeling on the right side of the throat x2 months.  However she denies any problems swallowing solids or liquids.   -Associated with increase of acid reflux and taste of back brush on the back of the throat.  Taking Pepcid but does not feel that it helps.  No significant weight changes  3.  DM: Reports stomach pain when she initially started Januvia or Glipizide but this has since resolved.  She reports being on Amaryl in the past and tolerated that better.  She is wanting to know if she can be switched to Amaryl.  Currently taking Lantus 55 units daily.  Reportedly did not tolerate metformin in the past due to GI upset. Checks blood sugars 2 x a day 168-304 Eating habits:  Wheat bread.  Drinks 1 soda a day, sweet tea  4.  Complains of stiffness and pain in the lower back.  She is wondering whether she needs a higher dose of muscle relaxant.  Patient Active Problem List   Diagnosis Date Noted  . Uncontrolled type 2 diabetes mellitus with diabetic neuropathy, with long-term current use of insulin ([place]) 10/29/2017  . History of macular degeneration 10/29/2017  . Dermatitis 10/29/2016  . Chronic cervical pain 10/29/2016  . Myalgia 09/14/2016  . Insomnia 09/14/2016  . Tinea pedis of both feet 08/20/2016  . Macular degeneration 08/17/2016  . Low back pain 09/21/2014  . Hypertension 09/21/2014  . Diabetic gastroparesis associated with type 2 diabetes mellitus  ([place]) 09/14/2014  . Thyroid nodule 08/17/2014  . Obesity (BMI 30-39.9) 08/17/2014  . Hyperlipidemia 08/05/2014  . Type II diabetes mellitus with neurological manifestations, uncontrolled ([place]) 08/04/2014  . Elevated serum protein level 08/04/2014     Current Outpatient Medications on File Prior to Visit  Medication Sig Dispense Refill  . acetaminophen (TYLENOL) 500 MG tablet Take 2 tablets (1,000 mg total) by mouth every 8 (eight) hours as needed for mild pain or moderate pain. 30 tablet 2  . amLODipine (NORVASC) 10 MG tablet Take 1 tablet (10 mg total) by mouth daily. 30 tablet 2  . atorvastatin (LIPITOR) 40 MG tablet Take 1 tablet (40 mg total) daily by mouth. 90 tablet 1  . buPROPion (WELLBUTRIN SR) 150 MG 12 hr tablet Take 1 tablet (150 mg total) by mouth 2 (two) times daily. 60 tablet 2  . cyclobenzaprine (FLEXERIL) 5 MG tablet Take 1 tablet (5 mg total) by mouth 2 (two) times daily as needed for muscle spasms. 60 tablet 1  . diclofenac sodium (VOLTAREN) 1 % GEL Apply 4 g topically 4 (four) times daily. 100 g 3  . DULoxetine (CYMBALTA) 20 MG capsule Take 1 capsule (20 mg total) by mouth daily. 30 capsule 2  . gabapentin (NEURONTIN) 300 MG capsule Take 1 capsule (300 mg total) by mouth at bedtime. 30 capsule 2  . hydrALAZINE (APRESOLINE) 100 MG tablet Take 1 tablet (100 mg total) by mouth every 8 (eight) hours. 90 tablet 2  .  Insulin Pen Needle (PEN NEEDLES) 32G X 4 MM MISC 1 each by Does not apply route 4 (four) times daily. 120 each 11  . ketoconazole (NIZORAL) 2 % cream Apply 1 application topically daily. To rash between toes 15 g 0  . lisinopril (PRINIVIL,ZESTRIL) 40 MG tablet Take 1 tablet (40 mg total) by mouth daily. 30 tablet 2  . magnesium chloride (SLOW-MAG) 64 MG TBEC SR tablet Take 1 tablet by mouth.    . naproxen (NAPROSYN) 500 MG tablet Take 1 tablet (500 mg total) by mouth 2 (two) times daily with a meal. (Patient not taking: Reported on 03/06/2018) 30 tablet 0  .  polyethylene glycol powder (GLYCOLAX/MIRALAX) powder Take 17 g by mouth daily. 3350 g 1  . sitaGLIPtin (JANUVIA) 100 MG tablet Take 1 tablet (100 mg total) by mouth daily. 30 tablet 2  . Skin Protectants, Misc. (EUCERIN) cream Apply topically as needed for dry skin. 454 g 0  . traZODone (DESYREL) 50 MG tablet Take 0.5-1 tablets (25-50 mg total) by mouth at bedtime as needed for sleep. 30 tablet 1  . [DISCONTINUED] Insulin NPH Isophane & Regular (RELION 70/30 [place]) Inject 35 Units into the skin 2 (two) times daily.     Current Facility-Administered Medications on File Prior to Visit  Medication Dose Route Frequency Provider Last Rate Last Dose  . bacitracin ointment 1 application  1 application Topical Once Alfonse Spruce, FNP      . neomycin-bacitracin-polymyxin (NEOSPORIN) ointment   Topical Once Alfonse Spruce, FNP        Allergies  Allergen Reactions  . Hydrocodone Other (See Comments)    Upset stomach  . Metformin And Related Nausea And Vomiting and Other (See Comments)    Stomach pains   . Sulfur Hives    Social History   Socioeconomic History  . Marital status: Married    Spouse name: [name]  . Number of children: 1  . Years of education: some colle  . Highest education level: Not on file  Occupational History  . Occupation: Employed FT as Landscape architect    Comment: [employer]   Social Needs  . Financial resource strain: Not on file  . Food insecurity:    Worry: Not on file    Inability: Not on file  . Transportation needs:    Medical: Not on file    Non-medical: Not on file  Tobacco Use  . Smoking status: Former Smoker    Packs/day: 0.25    Years: 0.50    Pack years: 0.12    Types: Cigarettes  . Smokeless tobacco: Never Used  Substance and Sexual Activity  . Alcohol use: No    Frequency: Never    Comment: occasion   . Drug use: No  . Sexual activity: Yes    Birth control/protection: None  Lifestyle  . Physical activity:    Days per week: Not on  file    Minutes per session: Not on file  . Stress: Not on file  Relationships  . Social connections:    Talks on phone: Not on file    Gets together: Not on file    Attends religious service: Not on file    Active member of club or organization: Not on file    Attends meetings of clubs or organizations: Not on file    Relationship status: Not on file  . Intimate partner violence:    Fear of current or ex partner: Not on file    Emotionally  abused: Not on file    Physically abused: Not on file    Forced sexual activity: Not on file  Other Topics Concern  . Not on file  Social History Narrative   Married.  Lives with husband and daughter.  Works full time in Engineer, structural (high stress).   Caring for mother who's health is failing, starting dialysis.     Family History  Problem Relation Age of Onset  . Hypertension Mother   . Heart disease Mother   . Diabetes Mother   . Thyroid disease Mother   . Congestive Heart Failure Mother   . Breast cancer Maternal Grandmother   . Colon cancer Maternal Grandfather   . Heart attack Sister   . Heart disease Brother   . Hyperlipidemia Brother   . Hypertension Brother   . Diabetes Father   . Breast cancer Maternal Aunt     Past Surgical History:  Procedure Laterality Date  . ABDOMINAL HYSTERECTOMY  2005  . CESAREAN SECTION  1983   . ESOPHAGOGASTRODUODENOSCOPY (EGD) WITH PROPOFOL Left 08/26/2014   Procedure: ESOPHAGOGASTRODUODENOSCOPY (EGD) WITH PROPOFOL;  Surgeon: Arta Silence, MD;  Location: [facility] END[facility];  Service: Endoscopy;  Laterality: Left;    ROS: Review of Systems Negative except as stated above PHYSICAL EXAM: BP 115/76 (BP Location: Left Arm, Patient Position: Sitting, Cuff Size: Normal)   Pulse 90   Temp 98.5 F (36.9 C) (Oral)   Resp 18   Ht 5\' 3"  (1.6 m)   Wt 208 lb 3.2 oz (94.4 kg)   SpO2 99%   BMI 36.88 kg/m   Wt Readings from Last 3 Encounters:  04/18/18 208 lb 3.2 oz (94.4 kg)  03/06/18 207  lb 3.2 oz (94 kg)  02/03/18 211 lb 9.6 oz (96 kg)    Physical Exam  General appearance - alert, well appearing, and in no distress Mental status - normal mood, behavior, speech, dress, motor activity, and thought processes Mouth - mucous membranes moist, pharynx normal without lesions Neck - supple, no thyroid enlargement or nodules.  Firm mass in the area of the right parotid gland, non-tender to touch Chest - clear to auscultation, no wheezes, rales or rhonchi, symmetric air entry Heart - normal rate, regular rhythm, normal S1, S2, no murmurs, rubs, clicks or gallops Extremities - peripheral pulses normal, no pedal edema, no clubbing or cyanosis  Results for orders placed or performed in visit on 04/18/18  Glucose (CBG)  Result Value Ref Range   POC Glucose 308 (A) 70 - 99 mg/dl   Lab Results  Component Value Date   HGBA1C 11.4 03/06/2018     ASSESSMENT AND PLAN: 1. Uncontrolled type 2 diabetes mellitus with diabetic neuropathy, with long-term current use of insulin ([place]) Discussed the importance of healthy eating habits, regular aerobic exercise (at least 150 minutes a week as tolerated) and medication compliance to achieve or maintain control of diabetes. Increase Lantus to 60 units daily.  Change glipizide to Amaryl.  Continue to monitor blood sugars - Glucose (CBG) - glimepiride (AMARYL) 4 MG tablet; Take 1 tablet (4 mg total) by mouth daily before breakfast.  Dispense: 30 tablet; Refill: 3 - Insulin Glargine (LANTUS SOLOSTAR) 100 UNIT/ML Solostar Pen; Inject 60 Units into the skin daily.  Dispense: 5 pen; Refill: 5  2. Parotid tumor -Patient to let me know when she is approved for the [facility] discount so that I can have them move forward with scheduling the appointment for her to see ENT  3.  Gastroesophageal reflux disease without esophagitis GERD precautions discussed.  Change Pepcid to omeprazole - omeprazole (PRILOSEC) 20 MG capsule; Take 1 capsule (20 mg total) by mouth  daily.  Dispense: 30 capsule; Refill: 3  4. Chronic low back pain without sciatica, unspecified back pain laterality - meloxicam (MOBIC) 15 MG tablet; Take 1 tablet (15 mg total) by mouth daily.  Dispense: 30 tablet; Refill: 0  5. Oropharyngeal dysphagia To improve [facility] we can refer her to GI to be considered for EGD and screening colonoscopy   Patient was given the opportunity to ask questions.  Patient verbalized understanding of the plan and was able to repeat key elements of the plan.   Orders Placed This Encounter  Procedures  . Glucose (CBG)     Requested Prescriptions   Signed Prescriptions Disp Refills  . glimepiride (AMARYL) 4 MG tablet 30 tablet 3    Sig: Take 1 tablet (4 mg total) by mouth daily before breakfast.  . meloxicam (MOBIC) 15 MG tablet 30 tablet 0    Sig: Take 1 tablet (15 mg total) by mouth daily.  Marland Kitchen omeprazole (PRILOSEC) 20 MG capsule 30 capsule 3    Sig: Take 1 capsule (20 mg total) by mouth daily.  . Insulin Glargine (LANTUS SOLOSTAR) 100 UNIT/ML Solostar Pen 5 pen 5    Sig: Inject 60 Units into the skin daily.    Return in about 2 months (around 06/18/2018).  Nancy Plumber, MD, FACP

## 2018-04-18 NOTE — Progress Notes (Signed)
Patient stated that Nancy [name] & Glipizide is making her stomach pain she stop taking them last Sunday and resume on Tuesday

## 2018-04-21 DIAGNOSIS — I10 Essential (primary) hypertension: Secondary | ICD-10-CM

## 2018-05-16 ENCOUNTER — Other Ambulatory Visit: Payer: Self-pay | Admitting: Internal Medicine

## 2018-05-16 DIAGNOSIS — G8929 Other chronic pain: Secondary | ICD-10-CM

## 2018-05-16 DIAGNOSIS — M545 Low back pain: Principal | ICD-10-CM

## 2018-05-16 MED FILL — LISINOPRIL 40 MG TABLET: 40 | 30 days supply | Qty: 30 | Fill #2

## 2018-05-16 MED FILL — traZODone HCL 50 MG TABS: 50 | 30 days supply | Qty: 30 | Fill #0

## 2018-05-16 MED FILL — OMEPRAZOLE 20 MG CAP: 20 | 30 days supply | Qty: 30 | Fill #1

## 2018-05-16 MED FILL — GABAPENTIN 300 MG CAPSULE: 300 | 30 days supply | Qty: 30 | Fill #2

## 2018-05-19 ENCOUNTER — Ambulatory Visit: Payer: Self-pay

## 2018-05-19 MED FILL — MELOXICAM 15 MG TABLET: 15 | 30 days supply | Qty: 30 | Fill #0

## 2018-06-10 MED FILL — GLIMEPIRIDE 4 MG TABS: 4 | 30 days supply | Qty: 30 | Fill #1

## 2018-07-02 MED FILL — BUPROPION SR 150 MG TABLET: 150 | 30 days supply | Qty: 60 | Fill #1

## 2018-07-02 MED FILL — MELOXICAM 15 MG TABLET: 15 | 30 days supply | Qty: 30 | Fill #1

## 2018-07-02 MED FILL — OMEPRAZOLE 20 MG CAP: 20 | 30 days supply | Qty: 30 | Fill #2

## 2018-07-02 MED FILL — CYCLOBENZAPRINE 5 MG TABLET: 5 | 30 days supply | Qty: 60 | Fill #0

## 2018-07-02 MED FILL — GABAPENTIN 300 MG CAPSULE: 300 | 30 days supply | Qty: 30 | Fill #0

## 2018-08-05 MED FILL — CYCLOBENZAPRINE 5 MG TABLET: 5 | 30 days supply | Qty: 60 | Fill #1

## 2018-08-05 MED FILL — OMEPRAZOLE 20 MG CAP: 20 | 30 days supply | Qty: 30 | Fill #3

## 2018-08-05 MED FILL — GABAPENTIN 300 MG CAPSULE: 300 | 30 days supply | Qty: 30 | Fill #1

## 2018-08-05 MED FILL — GLIMEPIRIDE 4 MG TABS: 4 | 30 days supply | Qty: 30 | Fill #2

## 2018-08-05 MED FILL — MELOXICAM 15 MG TABLET: 15 | 30 days supply | Qty: 30 | Fill #2

## 2018-08-27 ENCOUNTER — Ambulatory Visit: Payer: Self-pay | Attending: Internal Medicine | Admitting: Physician Assistant

## 2018-08-27 VITALS — BP 130/82 | HR 97 | Temp 98.2°F | Resp 18 | Ht 63.0 in | Wt 208.0 lb

## 2018-08-27 DIAGNOSIS — Z794 Long term (current) use of insulin: Secondary | ICD-10-CM

## 2018-08-27 DIAGNOSIS — L97511 Non-pressure chronic ulcer of other part of right foot limited to breakdown of skin: Secondary | ICD-10-CM

## 2018-08-27 DIAGNOSIS — E1165 Type 2 diabetes mellitus with hyperglycemia: Secondary | ICD-10-CM

## 2018-08-27 DIAGNOSIS — E11621 Type 2 diabetes mellitus with foot ulcer: Secondary | ICD-10-CM

## 2018-08-27 DIAGNOSIS — E1140 Type 2 diabetes mellitus with diabetic neuropathy, unspecified: Secondary | ICD-10-CM

## 2018-08-27 DIAGNOSIS — IMO0002 Reserved for concepts with insufficient information to code with codable children: Secondary | ICD-10-CM

## 2018-08-27 DIAGNOSIS — I10 Essential (primary) hypertension: Secondary | ICD-10-CM

## 2018-08-27 LAB — POCT GLYCOSYLATED HEMOGLOBIN (HGB A1C): HbA1c, POC (controlled diabetic range): 13.2 % — AB (ref 0.0–7.0)

## 2018-08-27 LAB — GLUCOSE, POCT (MANUAL RESULT ENTRY): POC Glucose: 313 mg/dl — AB (ref 70–99)

## 2018-08-27 MED ORDER — MUPIROCIN 2 % EX OINT: TOPICAL_OINTMENT | CUTANEOUS | 0 refills | Status: DC

## 2018-08-27 MED FILL — MUPIROCIN 2% OINTMENT: 2 | 11 days supply | Qty: 22 | Fill #0

## 2018-08-27 NOTE — Progress Notes (Signed)
Patient ID: JULAINE ZIMNY, female   DOB: August 26, 1961, 57 y.o.   MRN: [id number]   Nancy [name], is a 57 y.o. female  GYK:[id number]  [id number]:[id number]  DOB - November 08, 1961  Subjective:  Chief Complaint and HPI: Nancy [name] is a 57 y.o. female here with sore on toe.  She bumped her R great and 2nd toe on the bed corner about 1 week ago.  There is a small sore on the tip of her great toe.  Not painful.  She was also filing the skin on her toe and thinks the sore may have developed from that.    Blood sugars high.  Says she takes her meds about 70% of the time.     ROS:   Constitutional:  No f/c, No night sweats, No unexplained weight loss. EENT:  No vision changes, No blurry vision, No hearing changes. No mouth, throat, or ear problems.  Respiratory: No cough, No SOB Cardiac: No CP, no palpitations GI:  No abd pain, No N/V/D. GU: No Urinary s/sx Musculoskeletal: No joint pain Neuro: No headache, no dizziness, no motor weakness.  Skin: No rash Endocrine:  No polydipsia. No polyuria.  Psych: Denies SI/HI  No problems updated.  ALLERGIES: Allergies  Allergen Reactions  . Hydrocodone Other (See Comments)    Upset stomach  . Metformin And Related Nausea And Vomiting and Other (See Comments)    Stomach pains   . Sulfur Hives    PAST MEDICAL HISTORY: Past Medical History:  Diagnosis Date  . Anemia 2006  . Depression 2014   previously on amitryptiline   . Diabetes mellitus with neurological manifestation ([place]) 2006  . Fracture of left ankle 1997   . Gastroparesis 07/2016  . HOH (hard of hearing) 2004   . Hyperlipidemia 2006  . Hypertension 2006  . IBS (irritable bowel syndrome) 2002   . Leukopenia 2015   . Shingles 2009   . Thyroid nodule 2004    MEDICATIONS AT HOME: Prior to Admission medications   Medication Sig Start Date End Date Taking? Authorizing Provider  acetaminophen (TYLENOL) 500 MG tablet Take 2 tablets (1,000 mg total) by mouth every 8 (eight)  hours as needed for mild pain or moderate pain. 02/03/18   Alfonse Spruce, FNP  amLODipine (NORVASC) 10 MG tablet Take 1 tablet (10 mg total) by mouth daily. 03/06/18   Argentina Donovan, PA-C  atorvastatin (LIPITOR) 40 MG tablet Take 1 tablet (40 mg total) daily by mouth. 10/29/17   Alfonse Spruce, FNP  buPROPion (WELLBUTRIN SR) 150 MG 12 hr tablet Take 1 tablet (150 mg total) by mouth 2 (two) times daily. 02/03/18   Alfonse Spruce, FNP  cyclobenzaprine (FLEXERIL) 5 MG tablet Take 1 tablet (5 mg total) by mouth 2 (two) times daily as needed for muscle spasms. 03/06/18   Argentina Donovan, PA-C  diclofenac sodium (VOLTAREN) 1 % GEL Apply 4 g topically 4 (four) times daily. 09/14/16   Funches, Adriana Mccallum, MD  DULoxetine (CYMBALTA) 20 MG capsule Take 1 capsule (20 mg total) by mouth daily. 02/03/18   Alfonse Spruce, FNP  gabapentin (NEURONTIN) 300 MG capsule Take 1 capsule (300 mg total) by mouth at bedtime. 03/06/18   Argentina Donovan, PA-C  glimepiride (AMARYL) 4 MG tablet Take 1 tablet (4 mg total) by mouth daily before breakfast. 04/18/18   [name], MD  hydrALAZINE (APRESOLINE) 100 MG tablet Take 1 tablet (100 mg total) by mouth every 8 (eight) hours. 02/03/18  Alfonse Spruce, FNP  Insulin Glargine (LANTUS SOLOSTAR) 100 UNIT/ML Solostar Pen Inject 60 Units into the skin daily. 04/18/18   [name], MD  Insulin Pen Needle (PEN NEEDLES) 32G X 4 MM MISC 1 each by Does not apply route 4 (four) times daily. 12/04/16   Funches, Adriana Mccallum, MD  ketoconazole (NIZORAL) 2 % cream Apply 1 application topically daily. To rash between toes 01/09/18   Hairston, Toy Baker R, FNP  lisinopril (PRINIVIL,ZESTRIL) 40 MG tablet Take 1 tablet (40 mg total) by mouth daily. 03/06/18   Argentina Donovan, PA-C  magnesium chloride (SLOW-MAG) 64 MG TBEC SR tablet Take 1 tablet by mouth.    [provider]  meloxicam (MOBIC) 15 MG tablet TAKE 1 TABLET BY MOUTH DAILY. 05/16/18   [name], MD  mupirocin ointment (BACTROBAN) 2 % Apply bid to toe X 1 week 08/27/18   Argentina Donovan, PA-C  naproxen (NAPROSYN) 500 MG tablet Take 1 tablet (500 mg total) by mouth 2 (two) times daily with a meal. Patient not taking: Reported on 03/06/2018 12/04/16   Boykin Nearing, MD  omeprazole (PRILOSEC) 20 MG capsule Take 1 capsule (20 mg total) by mouth daily. 04/18/18   [name], MD  polyethylene glycol powder (GLYCOLAX/MIRALAX) powder Take 17 g by mouth daily. 08/17/16   Funches, Adriana Mccallum, MD  sitaGLIPtin (JANUVIA) 100 MG tablet Take 1 tablet (100 mg total) by mouth daily. 03/06/18   Argentina Donovan, PA-C  Skin Protectants, Misc. (EUCERIN) cream Apply topically as needed for dry skin. 01/09/18   Alfonse Spruce, FNP  traZODone (DESYREL) 50 MG tablet Take 0.5-1 tablets (25-50 mg total) by mouth at bedtime as needed for sleep. 03/06/18   Argentina Donovan, PA-C  Insulin NPH Isophane & Regular (RELION 70/30 [place]) Inject 35 Units into the skin 2 (two) times daily.  08/17/14  [provider]     Objective:  EXAM:   Vitals:   08/27/18 1038  BP: 130/82  Pulse: 97  Resp: 18  Temp: 98.2 F (36.8 C)  TempSrc: Oral  SpO2: 100%  Weight: 208 lb (94.3 kg)  Height: 5\' 3"  (1.6 m)    General appearance : A&OX3. NAD. Non-toxic-appearing HEENT: Atraumatic and Normocephalic.  PERRLA. EOM intact.  Neck: supple, no JVD. No cervical lymphadenopathy. No thyromegaly Chest/Lungs:  Breathing-non-labored, Good air entry bilaterally, breath sounds normal without rales, rhonchi, or wheezing  CVS: S1 S2 regular, no murmurs, gallops, rubs  Extremities: Bilateral Lower Ext shows no edema, both legs are warm to touch with = pulse throughout.  R vs L foot examined.  R great toe with 63mm superficial ulcer on distal tip.  Color is good.  N-V intact.  Capillary RF sluggish but near normal B.  Sensory decreased.  R 2nd and 5th toe with a darkened nails but normal appearing skin.  No other  ulcerations. Neurology:  CN II-XII grossly intact, Non focal.   Psych:  TP linear. J/I WNL. Normal speech. Appropriate eye contact and affect.  Skin:  No Rash  Data Review Lab Results  Component Value Date   HGBA1C 11.4 03/06/2018   HGBA1C 13.9 10/29/2017   HGBA1C 14.0 01/07/2017     Assessment & Plan   1. Diabetic ulcer of toe of right foot associated with type 2 diabetes mellitus, limited to breakdown of skin ([place])- -close follow-up.  To ED if worsens - mupirocin ointment (BACTROBAN) 2 %; Apply bid to toe X 1 week  Dispense: 22  g; Refill: 0  2. Uncontrolled type 2 diabetes mellitus with diabetic neuropathy, with long-term current use of insulin ([facility]) -13.2=A1C today.  Uncontrolled.   Compliance is imperative with current regimen before adding/changing doses.  Check blood sugars tid and record and bring to next visit - Glucose (CBG) - POCT glycosylated hemoglobin (Hb A1C) - Comprehensive metabolic panel - CBC with Differential/Platelet  3. Essential hypertension Controlled-continue current regimen.   - Comprehensive metabolic panel - CBC with Differential/Platelet     Patient have been counseled extensively about nutrition and exercise  Return in about 1 week (around 09/03/2018) for with me for ulcer and diabetes recheck.  The patient was given clear instructions to go to ER or return to medical center if symptoms don't improve, worsen or new problems develop. The patient verbalized understanding. The patient was told to call to get lab results if they haven't heard anything in the next week.     Freeman Caldron, PA-C [facility] [place], [place]   08/27/2018, 10:48 AM

## 2018-08-27 NOTE — Patient Instructions (Addendum)
Check blood sugars 3 times daily and record and bring to next visit    Diabetes and Foot Care Diabetes may cause you to have problems because of poor blood supply (circulation) to your feet and legs. This may cause the skin on your feet to become thinner, break easier, and heal more slowly. Your skin may become dry, and the skin may peel and crack. You may also have nerve damage in your legs and feet causing decreased feeling in them. You may not notice minor injuries to your feet that could lead to infections or more serious problems. Taking care of your feet is one of the most important things you can do for yourself. Follow these instructions at home:  Wear shoes at all times, even in the house. Do not go barefoot. Bare feet are easily injured.  Check your feet daily for blisters, cuts, and redness. If you cannot see the bottom of your feet, use a mirror or ask someone for help.  Wash your feet with warm water (do not use hot water) and mild soap. Then pat your feet and the areas between your toes until they are completely dry. Do not soak your feet as this can dry your skin.  Apply a moisturizing lotion or petroleum jelly (that does not contain alcohol and is unscented) to the skin on your feet and to dry, brittle toenails. Do not apply lotion between your toes.  Trim your toenails straight across. Do not dig under them or around the cuticle. File the edges of your nails with an emery board or nail file.  Do not cut corns or calluses or try to remove them with medicine.  Wear clean socks or stockings every day. Make sure they are not too tight. Do not wear knee-high stockings since they may decrease blood flow to your legs.  Wear shoes that fit properly and have enough cushioning. To break in new shoes, wear them for just a few hours a day. This prevents you from injuring your feet. Always look in your shoes before you put them on to be sure there are no objects inside.  Do not cross your  legs. This may decrease the blood flow to your feet.  If you find a minor scrape, cut, or break in the skin on your feet, keep it and the skin around it clean and dry. These areas may be cleansed with mild soap and water. Do not cleanse the area with peroxide, alcohol, or iodine.  When you remove an adhesive bandage, be sure not to damage the skin around it.  If you have a wound, look at it several times a day to make sure it is healing.  Do not use heating pads or hot water bottles. They may burn your skin. If you have lost feeling in your feet or legs, you may not know it is happening until it is too late.  Make sure your health care provider performs a complete foot exam at least annually or more often if you have foot problems. Report any cuts, sores, or bruises to your health care provider immediately. Contact a health care provider if:  You have an injury that is not healing.  You have cuts or breaks in the skin.  You have an ingrown nail.  You notice redness on your legs or feet.  You feel burning or tingling in your legs or feet.  You have pain or cramps in your legs and feet.  Your legs or feet are  numb.  Your feet always feel cold. Get help right away if:  There is increasing redness, swelling, or pain in or around a wound.  There is a red line that goes up your leg.  Pus is coming from a wound.  You develop a fever or as directed by your health care provider.  You notice a bad smell coming from an ulcer or wound. This information is not intended to replace advice given to you by your health care provider. Make sure you discuss any questions you have with your health care provider. Document Released: 11/30/2000 Document Revised: 05/10/2016 Document Reviewed: 05/12/2013 Elsevier Interactive Patient Education  2017 [employer].

## 2018-08-28 ENCOUNTER — Telehealth: Payer: Self-pay | Admitting: *Deleted

## 2018-08-28 LAB — COMPREHENSIVE METABOLIC PANEL
ALT: 21 IU/L (ref 0–32)
AST: 16 IU/L (ref 0–40)
Albumin/Globulin Ratio: 1.2 (ref 1.2–2.2)
Albumin: 3.9 g/dL (ref 3.5–5.5)
Alkaline Phosphatase: 101 IU/L (ref 39–117)
BILIRUBIN TOTAL: 0.2 mg/dL (ref 0.0–1.2)
BUN/Creatinine Ratio: 22 (ref 9–23)
BUN: 28 mg/dL — AB (ref 6–24)
CALCIUM: 10 mg/dL (ref 8.7–10.2)
CHLORIDE: 97 mmol/L (ref 96–106)
CO2: 23 mmol/L (ref 20–29)
Creatinine, Ser: 1.29 mg/dL — ABNORMAL HIGH (ref 0.57–1.00)
GFR, EST AFRICAN AMERICAN: 53 mL/min/{1.73_m2} — AB (ref >59)
GFR, EST NON AFRICAN AMERICAN: 46 mL/min/{1.73_m2} — AB (ref >59)
GLUCOSE: 354 mg/dL — AB (ref 65–99)
Globulin, Total: 3.2 g/dL (ref 1.5–4.5)
Potassium: 4.6 mmol/L (ref 3.5–5.2)
Sodium: 138 mmol/L (ref 134–144)
TOTAL PROTEIN: 7.1 g/dL (ref 6.0–8.5)

## 2018-08-28 LAB — CBC WITH DIFFERENTIAL/PLATELET
BASOS ABS: 0.1 10*3/uL (ref 0.0–0.2)
Basos: 1 %
EOS (ABSOLUTE): 0.8 10*3/uL — AB (ref 0.0–0.4)
Eos: 8 %
HEMOGLOBIN: 12.2 g/dL (ref 11.1–15.9)
Hematocrit: 37 % (ref 34.0–46.6)
IMMATURE GRANS (ABS): 0 10*3/uL (ref 0.0–0.1)
IMMATURE GRANULOCYTES: 0 %
LYMPHS: 40 %
Lymphocytes Absolute: 3.9 10*3/uL — ABNORMAL HIGH (ref 0.7–3.1)
MCH: 28 pg (ref 26.6–33.0)
MCHC: 33 g/dL (ref 31.5–35.7)
MCV: 85 fL (ref 79–97)
MONOCYTES: 6 %
Monocytes Absolute: 0.6 10*3/uL (ref 0.1–0.9)
NEUTROS PCT: 45 %
Neutrophils Absolute: 4.5 10*3/uL (ref 1.4–7.0)
PLATELETS: 269 10*3/uL (ref 150–450)
RBC: 4.36 x10E6/uL (ref 3.77–5.28)
RDW: 12.2 % — ABNORMAL LOW (ref 12.3–15.4)
WBC: 9.9 10*3/uL (ref 3.4–10.8)

## 2018-08-28 NOTE — Telephone Encounter (Signed)
Medical Assistant left message on patient's home and cell voicemail. Voicemail states to give a call back to Nubia with [facility] at [phone].  

## 2018-08-28 NOTE — Telephone Encounter (Signed)
-----   Message from Argentina Donovan, [place] sent at 08/28/2018  8:16 AM EDT ----- Your labs show elevated blood sugar and poor kidney function which can lead to kidney failure and the need for dialysis.  It is imperative to take all of your blood sugar medications/insulin as directed/check your blood sugars as directed and follow-up regularly and as planned.  Thanks, Freeman Caldron, PA-C

## 2018-09-03 ENCOUNTER — Ambulatory Visit: Payer: Self-pay

## 2018-09-04 ENCOUNTER — Ambulatory Visit: Payer: Self-pay

## 2018-09-24 ENCOUNTER — Other Ambulatory Visit: Payer: Self-pay | Admitting: Internal Medicine

## 2018-09-24 DIAGNOSIS — K219 Gastro-esophageal reflux disease without esophagitis: Secondary | ICD-10-CM

## 2018-09-24 MED FILL — OMEPRAZOLE 20 MG CAP: 20 | 30 days supply | Qty: 30 | Fill #0

## 2018-09-24 MED FILL — ATORVASTATIN CALCIUM 40 MG: 40 | 30 days supply | Qty: 30 | Fill #1

## 2018-09-24 MED FILL — traZODone HCL 50 MG TABS: 50 | 30 days supply | Qty: 30 | Fill #1

## 2018-09-24 MED FILL — GABAPENTIN 300 MG CAPSULE: 300 | 30 days supply | Qty: 30 | Fill #2

## 2018-09-24 MED FILL — MELOXICAM 15 MG TABLET: 15 | 30 days supply | Qty: 30 | Fill #3

## 2018-09-24 MED FILL — GLIMEPIRIDE 4 MG TABS: 4 | 30 days supply | Qty: 30 | Fill #3

## 2018-09-24 MED FILL — LISINOPRIL 40 MG TABLET: 40 | 30 days supply | Qty: 30 | Fill #0

## 2018-10-06 ENCOUNTER — Emergency Department (HOSPITAL_COMMUNITY)
Admission: EM | Admit: 2018-10-06 | Discharge: 2018-10-07 | Disposition: A | Discharge status: Home / Self Care | Payer: Self-pay | Attending: Emergency Medicine | Admitting: Emergency Medicine

## 2018-10-06 ENCOUNTER — Encounter (HOSPITAL_COMMUNITY): Payer: Self-pay | Admitting: Emergency Medicine

## 2018-10-06 ENCOUNTER — Emergency Department (HOSPITAL_COMMUNITY): Payer: Self-pay

## 2018-10-06 DIAGNOSIS — R739 Hyperglycemia, unspecified: Secondary | ICD-10-CM

## 2018-10-06 DIAGNOSIS — I10 Essential (primary) hypertension: Secondary | ICD-10-CM | POA: Insufficient documentation

## 2018-10-06 DIAGNOSIS — Z79899 Other long term (current) drug therapy: Secondary | ICD-10-CM | POA: Insufficient documentation

## 2018-10-06 DIAGNOSIS — J069 Acute upper respiratory infection, unspecified: Secondary | ICD-10-CM | POA: Insufficient documentation

## 2018-10-06 DIAGNOSIS — E1165 Type 2 diabetes mellitus with hyperglycemia: Secondary | ICD-10-CM | POA: Insufficient documentation

## 2018-10-06 DIAGNOSIS — Z87891 Personal history of nicotine dependence: Secondary | ICD-10-CM | POA: Insufficient documentation

## 2018-10-06 DIAGNOSIS — Z794 Long term (current) use of insulin: Secondary | ICD-10-CM | POA: Insufficient documentation

## 2018-10-06 DIAGNOSIS — L97519 Non-pressure chronic ulcer of other part of right foot with unspecified severity: Secondary | ICD-10-CM

## 2018-10-06 LAB — CBC
HCT: 39.7 % (ref 36.0–46.0)
HEMOGLOBIN: 12.6 g/dL (ref 12.0–15.0)
MCH: 27.5 pg (ref 26.0–34.0)
MCHC: 31.7 g/dL (ref 30.0–36.0)
MCV: 86.7 fL (ref 80.0–100.0)
NRBC: 0 % (ref 0.0–0.2)
PLATELETS: 288 10*3/uL (ref 150–400)
RBC: 4.58 MIL/uL (ref 3.87–5.11)
RDW: 12.3 % (ref 11.5–15.5)
WBC: 8.7 10*3/uL (ref 4.0–10.5)

## 2018-10-06 LAB — BASIC METABOLIC PANEL
ANION GAP: 10 (ref 5–15)
BUN: 14 mg/dL (ref 6–20)
CO2: 25 mmol/L (ref 22–32)
Calcium: 9.4 mg/dL (ref 8.9–10.3)
Chloride: 96 mmol/L — ABNORMAL LOW (ref 98–111)
Creatinine, Ser: 1.47 mg/dL — ABNORMAL HIGH (ref 0.44–1.00)
GFR calc non Af Amer: 38 mL/min — ABNORMAL LOW (ref >60)
GFR, EST AFRICAN AMERICAN: 44 mL/min — AB (ref >60)
Glucose, Bld: 431 mg/dL — ABNORMAL HIGH (ref 70–99)
POTASSIUM: 4.8 mmol/L (ref 3.5–5.1)
Sodium: 131 mmol/L — ABNORMAL LOW (ref 135–145)

## 2018-10-06 LAB — I-STAT TROPONIN, ED: TROPONIN I, POC: 0 ng/mL (ref 0.00–0.08)

## 2018-10-06 LAB — CBG MONITORING, ED: Glucose-Capillary: 375 mg/dL — ABNORMAL HIGH (ref 70–99)

## 2018-10-06 LAB — I-STAT BETA HCG BLOOD, ED (MC, WL, AP ONLY)

## 2018-10-06 NOTE — ED Triage Notes (Signed)
Pt presents with cold/flu like symptoms x 4 days with cough and CP; pt also states she went to the urgent care who told her her CBG was >500; pt also c/o woundsdiscoloration to toes

## 2018-10-07 ENCOUNTER — Emergency Department (HOSPITAL_COMMUNITY): Payer: Self-pay

## 2018-10-07 LAB — CBG MONITORING, ED
GLUCOSE-CAPILLARY: 395 mg/dL — AB (ref 70–99)
Glucose-Capillary: 399 mg/dL — ABNORMAL HIGH (ref 70–99)

## 2018-10-07 LAB — I-STAT TROPONIN, ED: TROPONIN I, POC: 0 ng/mL (ref 0.00–0.08)

## 2018-10-07 MED ORDER — BENZONATATE 100 MG PO CAPS
100.0000 mg | ORAL_CAPSULE | Freq: Once | ORAL | Status: AC
  Administered 2018-10-07: 100 mg via ORAL
  Filled 2018-10-07: qty 1

## 2018-10-07 MED ORDER — CYCLOBENZAPRINE HCL 10 MG PO TABS
10.0000 mg | ORAL_TABLET | Freq: Once | ORAL | Status: AC
  Administered 2018-10-07: 10 mg via ORAL
  Filled 2018-10-07: qty 1

## 2018-10-07 MED ORDER — SODIUM CHLORIDE 0.9 % IV BOLUS
1000.0000 mL | Freq: Once | INTRAVENOUS | Status: AC
  Administered 2018-10-07: 1000 mL via INTRAVENOUS

## 2018-10-07 MED ORDER — PROMETHAZINE-DM 6.25-15 MG/5ML PO SYRP: 5.0000 mL | ORAL_SOLUTION | Freq: Four times a day (QID) | ORAL | 0 refills | Status: DC | PRN

## 2018-10-07 MED ORDER — ALBUTEROL SULFATE HFA 108 (90 BASE) MCG/ACT IN AERS
2.0000 | INHALATION_SPRAY | RESPIRATORY_TRACT | Status: DC | PRN
  Filled 2018-10-07: qty 6.7

## 2018-10-07 MED ORDER — ACETAMINOPHEN 325 MG PO TABS: 325.0000 mg | ORAL_TABLET | Freq: Once | ORAL | Status: DC

## 2018-10-07 MED ORDER — INSULIN GLARGINE 100 UNIT/ML ~~LOC~~ SOLN
55.0000 [IU] | Freq: Once | SUBCUTANEOUS | Status: AC
  Administered 2018-10-07: 55 [IU] via SUBCUTANEOUS
  Filled 2018-10-07: qty 0.55

## 2018-10-07 MED ORDER — PROMETHAZINE-CODEINE 6.25-10 MG/5ML PO SYRP: 5.0000 mL | ORAL_SOLUTION | Freq: Four times a day (QID) | ORAL | Status: DC | PRN

## 2018-10-07 MED FILL — PROMETHAZINE W/DM SYRUP: 6.25-15 | 5 days supply | Qty: 118 | Fill #0

## 2018-10-07 NOTE — ED Provider Notes (Signed)
[facility] EMERGENCY DEPARTMENT Provider Note   CSN: [id number] Arrival date & time: 10/06/18  1537     History   Chief Complaint Chief Complaint  Patient presents with  . Chest Pain  . Dizziness  . Hyperglycemia    HPI Nancy [name] is a 57 y.o. female.  Patient with a history of poorly controlled DM, diabetic neuropathy, HLD, IBS presents with symptoms of cough, chest tightness, nasal congestion, chills without known fever x 4 days. No nausea or vomiting. Seen at Urgent Care prior to arrival and sent here for hyperglycemia. She also reports ulcerations to right great toe that are worsening over months.   The history is provided by the patient. No language interpreter was used.  Chest Pain   Associated symptoms include cough and dizziness. Pertinent negatives include no abdominal pain, no fever, no nausea and no vomiting.  Dizziness  Associated symptoms: chest pain   Associated symptoms: no nausea and no vomiting   Hyperglycemia  Associated symptoms: chest pain and dizziness   Associated symptoms: no abdominal pain, no fever, no nausea and no vomiting     Past Medical History:  Diagnosis Date  . Anemia 2006  . Depression 2014   previously on amitryptiline   . Diabetes mellitus with neurological manifestation ([place]) 2006  . Fracture of left ankle 1997   . Gastroparesis 07/2016  . HOH (hard of hearing) 2004   . Hyperlipidemia 2006  . Hypertension 2006  . IBS (irritable bowel syndrome) 2002   . Leukopenia 2015   . Shingles 2009   . Thyroid nodule 2004    Patient Active Problem List   Diagnosis Date Noted  . Gastroesophageal reflux disease without esophagitis 04/18/2018  . 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 Uncontrolled type 2 diabetes mellitus with diabetic neuropathy, with long-term current use of insulin ([place]) 10/29/2017  . History of macular degeneration 10/29/2017  . Dermatitis 10/29/2016  . Chronic cervical pain 10/29/2016  . Myalgia  09/14/2016  . Insomnia 09/14/2016  . Tinea pedis of both feet 08/20/2016  . Macular degeneration 08/17/2016  . Low back pain 09/21/2014  . Hypertension 09/21/2014  . Diabetic gastroparesis associated with type 2 diabetes mellitus ([place]) 09/14/2014  . Thyroid nodule 08/17/2014  . Obesity (BMI 30-39.9) 08/17/2014  . Hyperlipidemia 08/05/2014  . Type II diabetes mellitus with neurological manifestations, uncontrolled ([place]) 08/04/2014  . Elevated serum protein level 08/04/2014    Past Surgical History:  Procedure Laterality Date  . ABDOMINAL HYSTERECTOMY  2005  . CESAREAN SECTION  1983   . ESOPHAGOGASTRODUODENOSCOPY (EGD) WITH PROPOFOL Left 08/26/2014   Procedure: ESOPHAGOGASTRODUODENOSCOPY (EGD) WITH PROPOFOL;  Surgeon: Arta Silence, MD;  Location: [facility];  Service: Endoscopy;  Laterality: Left;     OB History   None      Home Medications    Prior to Admission medications   Medication Sig Start Date End Date Taking? Authorizing Provider  acetaminophen (TYLENOL) 500 MG tablet Take 2 tablets (1,000 mg total) by mouth every 8 (eight) hours as needed for mild pain or moderate pain. 02/03/18  Yes Hairston, Maylon Peppers, FNP  amLODipine (NORVASC) 10 MG tablet Take 1 tablet (10 mg total) by mouth daily. 03/06/18  Yes Freeman Caldron M, PA-C  atorvastatin (LIPITOR) 40 MG tablet Take 1 tablet (40 mg total) daily by mouth. 10/29/17  Yes Hairston, Mandesia R, FNP  buPROPion (WELLBUTRIN SR) 150 MG 12 hr tablet Take 1 tablet (150 mg total) by mouth 2 (two) times daily. 02/03/18  Yes Hairston, Mandesia R, FNP  gabapentin (NEURONTIN) 300 MG capsule Take 1 capsule (300 mg total) by mouth at bedtime. 03/06/18  Yes Freeman Caldron M, PA-C  glimepiride (AMARYL) 4 MG tablet Take 1 tablet (4 mg total) by mouth daily before breakfast. 04/18/18  Yes [name], MD  Insulin Glargine (LANTUS SOLOSTAR) 100 UNIT/ML Solostar Pen Inject 60 Units into the skin daily. 04/18/18  Yes [name], MD    lisinopril (PRINIVIL,ZESTRIL) 40 MG tablet Take 1 tablet (40 mg total) by mouth daily. 03/06/18  Yes McClung, Angela M, PA-C  meloxicam (MOBIC) 15 MG tablet TAKE 1 TABLET BY MOUTH DAILY. Patient taking differently: Take 15 mg by mouth daily.  05/16/18  Yes [name], MD  omeprazole (PRILOSEC) 20 MG capsule TAKE 1 CAPSULE (20 MG TOTAL) BY MOUTH DAILY. 09/24/18  Yes [name], MD  traZODone (DESYREL) 50 MG tablet Take 0.5-1 tablets (25-50 mg total) by mouth at bedtime as needed for sleep. 03/06/18  Yes McClung, Dionne Bucy, PA-C  cyclobenzaprine (FLEXERIL) 5 MG tablet Take 1 tablet (5 mg total) by mouth 2 (two) times daily as needed for muscle spasms. Patient not taking: Reported on 10/07/2018 03/06/18   Argentina Donovan, PA-C  diclofenac sodium (VOLTAREN) 1 % GEL Apply 4 g topically 4 (four) times daily. Patient not taking: Reported on 10/07/2018 09/14/16   Boykin Nearing, MD  DULoxetine (CYMBALTA) 20 MG capsule Take 1 capsule (20 mg total) by mouth daily. Patient not taking: Reported on 10/07/2018 02/03/18   Alfonse Spruce, FNP  hydrALAZINE (APRESOLINE) 100 MG tablet Take 1 tablet (100 mg total) by mouth every 8 (eight) hours. Patient not taking: Reported on 10/07/2018 02/03/18   Alfonse Spruce, FNP  Insulin Pen Needle (PEN NEEDLES) 32G X 4 MM MISC 1 each by Does not apply route 4 (four) times daily. 12/04/16   Funches, Adriana Mccallum, MD  ketoconazole (NIZORAL) 2 % cream Apply 1 application topically daily. To rash between toes Patient not taking: Reported on 10/07/2018 01/09/18   Alfonse Spruce, FNP  mupirocin ointment (BACTROBAN) 2 % Apply bid to toe X 1 week Patient not taking: Reported on 10/07/2018 08/27/18   Argentina Donovan, PA-C  naproxen (NAPROSYN) 500 MG tablet Take 1 tablet (500 mg total) by mouth 2 (two) times daily with a meal. Patient not taking: Reported on 03/06/2018 12/04/16   Boykin Nearing, MD  polyethylene glycol powder (GLYCOLAX/MIRALAX) powder Take 17  g by mouth daily. Patient not taking: Reported on 10/07/2018 08/17/16   Boykin Nearing, MD  sitaGLIPtin (JANUVIA) 100 MG tablet Take 1 tablet (100 mg total) by mouth daily. Patient not taking: Reported on 10/07/2018 03/06/18   Argentina Donovan, PA-C  Skin Protectants, Misc. (EUCERIN) cream Apply topically as needed for dry skin. Patient not taking: Reported on 10/07/2018 01/09/18   Alfonse Spruce, FNP  Insulin NPH Isophane & Regular (RELION 70/30 [place]) Inject 35 Units into the skin 2 (two) times daily.  08/17/14  [provider]    Family History Family History  Problem Relation Age of Onset  . Hypertension Mother   . Heart disease Mother   . Diabetes Mother   . Thyroid disease Mother   . Congestive Heart Failure Mother   . Breast cancer Maternal Grandmother   . Colon cancer Maternal Grandfather   . Heart attack Sister   . Heart disease Brother   . Hyperlipidemia Brother   . Hypertension Brother   . Diabetes Father   .  Breast cancer Maternal Aunt     Social History Social History   Tobacco Use  . Smoking status: Former Smoker    Packs/day: 0.25    Years: 0.50    Pack years: 0.12    Types: Cigarettes  . Smokeless tobacco: Never Used  Substance Use Topics  . Alcohol use: No    Frequency: Never    Comment: occasion   . Drug use: No     Allergies   Hydrocodone; Metformin and related; Plaquenil [hydroxychloroquine sulfate]; and Sulfur   Review of Systems Review of Systems  Constitutional: Positive for chills. Negative for fever.  HENT: Positive for congestion.   Respiratory: Positive for cough.   Cardiovascular: Positive for chest pain.  Gastrointestinal: Negative for abdominal pain, nausea and vomiting.  Skin: Positive for wound.  Neurological: Positive for dizziness.     Physical Exam Updated Vital Signs BP (!) 161/92   Pulse (!) 103   Temp 100 F (37.8 C) (Oral)   Resp 20   Ht 5\' 3"  (1.6 m)   Wt 94.3 kg   SpO2 93%   BMI 36.85 kg/m    Physical Exam  Constitutional: She is oriented to person, place, and time. She appears well-developed and well-nourished.  HENT:  Head: Normocephalic.  Neck: Normal range of motion. Neck supple.  Cardiovascular: Regular rhythm.  No murmur heard. Mildly tachycardic  Pulmonary/Chest: Effort normal and breath sounds normal. She has no wheezes. She has no rhonchi. She has no rales.  Abdominal: Soft. Bowel sounds are normal. There is no tenderness. There is no rebound and no guarding.  Musculoskeletal: Normal range of motion.       Left lower leg: Normal.  Ulceration to distal right great toe without drainage. No malodor. Nontender.   Neurological: She is alert and oriented to person, place, and time.  Skin: Skin is warm and dry. No rash noted.  Psychiatric: She has a normal mood and affect.     ED Treatments / Results  Labs (all labs ordered are listed, but only abnormal results are displayed) Labs Reviewed  BASIC METABOLIC PANEL - Abnormal; Notable for the following components:      Result Value   Sodium 131 (*)    Chloride 96 (*)    Glucose, Bld 431 (*)    Creatinine, Ser 1.47 (*)    GFR calc non Af Amer 38 (*)    GFR calc Af Amer 44 (*)    All other components within normal limits  CBG MONITORING, ED - Abnormal; Notable for the following components:   Glucose-Capillary 375 (*)    All other components within normal limits  CBC  I-STAT TROPONIN, ED  I-STAT BETA HCG BLOOD, ED ([facility], [facility], [facility] ONLY)  CBG MONITORING, ED    EKG EKG Interpretation  Date/Time:  Monday October 06 2018 16:07:26 EDT Ventricular Rate:  96 PR Interval:  134 QRS Duration: 72 QT Interval:  338 QTC Calculation: 427 R Axis:   -31 Text Interpretation:  Sinus rhythm with occasional Premature ventricular complexes Left axis deviation Abnormal ECG Nonspecific ST abnormality No significant change was found Confirmed by Ezequiel Essex [phone]) on 10/07/2018 1:38:58 AM   Radiology Dg Chest 2  View  Result Date: 10/06/2018 CLINICAL DATA:  Cough for 3-4 days, chest tightness starting yesterday. Shortness of breath with exertion. EXAM: CHEST - 2 VIEW COMPARISON:  Chest x-ray dated 07/23/2016. FINDINGS: Cardiomediastinal silhouette is within normal limits in size and configuration. Lungs are clear. Lung volumes are normal.  No evidence of pneumonia. No pleural effusion. No pneumothorax seen. Osseous structures about the chest are unremarkable. IMPRESSION: No active cardiopulmonary disease. Electronically Signed   By: Franki Cabot M.D.   On: 10/06/2018 17:15    Procedures Procedures (including critical care time)  Medications Ordered in ED Medications  insulin glargine (LANTUS) injection 55 Units (has no administration in time range)  sodium chloride 0.9 % bolus 1,000 mL (has no administration in time range)     Initial Impression / Assessment and Plan / ED Course  I have reviewed the triage vital signs and the nursing notes.  Pertinent labs & imaging results that were available during my care of the patient were reviewed by me and considered in my medical decision making (see chart for details).     Patient to Urgent Care for evaluation of URI symptoms of chest congestion with tightness, cough, chills, mild nasal congestion - x 4 days. She was found to be hyperglycemic and sent here for further evaluation.   She is very well appearing an din NAD. VSS. No hypoxia. CXR is clear without infection. Lung exam without abnormality, full breath sounds. Suspect viral URI. She is a poorly controlled diabetic posing a significant cardiac risk factor. Troponin x 2 checked secondary to c/o chest tightness and both are negative. EKG nonacute.   CBG 431 on arrival without acidosis. No nausea, vomiting. Cr 1.47, no significant change. She did not take her Lantus dose last evening. Meal provided along with this dose (55 U). IVF's also provided without improvement in CBG to 399. Patient's last A1c's  were both over 12 showing poor glycemic control. Previous CBG's are consistently elevated. Feel she can go home without more aggressive decrease to her sugar.   Foot ulceration is old without changes of acute infection. Imaging of the foot is negative for bony involvement. This can be followed with primary care.   Recommend symptomatic treatment of URI, cough medication Rx provided, Tylenol, fluids, PCP follow up. Encouraged better control of DM.   Final Clinical Impressions(s) / ED Diagnoses   Final diagnoses:  None   1. URI, viral 2. Hyperglycemia 3. Right foot ulceration, chronic  ED Discharge Orders    None       Charlann Lange, PA-C 10/07/18 0415    Ezequiel Essex, MD 10/07/18 [phone]

## 2018-10-07 NOTE — ED Notes (Signed)
Pt ambulated to bathroom with steady gait. Denies dizziness/lightheadedness.

## 2018-10-07 NOTE — Discharge Instructions (Addendum)
Tylenol and/or ibuprofen for aches and any fever you may develop. Take Phenergan-DM for cough as directed. Monitor your blood sugar closely and take your regular medications. You need to discuss you diabetes medications with your doctor as you are not able to get the medications that have been prescribed and your blood sugar remains poorly controlled.

## 2018-11-18 ENCOUNTER — Other Ambulatory Visit: Payer: Self-pay | Admitting: Physician Assistant

## 2018-11-18 ENCOUNTER — Other Ambulatory Visit: Payer: Self-pay | Admitting: Internal Medicine

## 2018-11-18 DIAGNOSIS — G8929 Other chronic pain: Secondary | ICD-10-CM

## 2018-11-18 DIAGNOSIS — E1165 Type 2 diabetes mellitus with hyperglycemia: Principal | ICD-10-CM

## 2018-11-18 DIAGNOSIS — F418 Other specified anxiety disorders: Secondary | ICD-10-CM

## 2018-11-18 DIAGNOSIS — M5441 Lumbago with sciatica, right side: Secondary | ICD-10-CM

## 2018-11-18 DIAGNOSIS — E1140 Type 2 diabetes mellitus with diabetic neuropathy, unspecified: Secondary | ICD-10-CM

## 2018-11-18 DIAGNOSIS — M542 Cervicalgia: Secondary | ICD-10-CM

## 2018-11-18 DIAGNOSIS — Z794 Long term (current) use of insulin: Principal | ICD-10-CM

## 2018-11-18 DIAGNOSIS — IMO0002 Reserved for concepts with insufficient information to code with codable children: Secondary | ICD-10-CM

## 2018-11-18 MED FILL — $JANUVIA 100 MG TABLET: 100 | 30 days supply | Qty: 30 | Fill #1

## 2018-11-18 MED FILL — GABAPENTIN 300 MG CAPSULE: 300 | 30 days supply | Qty: 30 | Fill #1

## 2018-11-18 MED FILL — $LANTUS SOLOSTAR 100 UNITS/: 100 | 75 days supply | Qty: 45 | Fill #0

## 2018-11-18 MED FILL — OMEPRAZOLE 20 MG CAP: 20 | 30 days supply | Qty: 30 | Fill #1

## 2018-11-18 MED FILL — MELOXICAM 15 MG TABLET: 15 | 30 days supply | Qty: 30 | Fill #4

## 2018-11-20 MED FILL — GLIMEPIRIDE 4 MG TABLET: 4 | 30 days supply | Qty: 30 | Fill #0

## 2018-11-20 MED FILL — CYCLOBENZAPRINE 5 MG TABLET: 5 | 30 days supply | Qty: 60 | Fill #0

## 2018-11-20 MED FILL — traZODone HCL 50 MG TABS: 50 | 30 days supply | Qty: 30 | Fill #0

## 2018-12-17 DIAGNOSIS — I639 Cerebral infarction, unspecified: Secondary | ICD-10-CM

## 2018-12-17 HISTORY — DX: Cerebral infarction, unspecified: I63.9

## 2019-01-14 ENCOUNTER — Other Ambulatory Visit: Payer: Self-pay | Admitting: Internal Medicine

## 2019-01-14 DIAGNOSIS — E1140 Type 2 diabetes mellitus with diabetic neuropathy, unspecified: Secondary | ICD-10-CM

## 2019-01-14 DIAGNOSIS — M542 Cervicalgia: Secondary | ICD-10-CM

## 2019-01-14 DIAGNOSIS — IMO0002 Reserved for concepts with insufficient information to code with codable children: Secondary | ICD-10-CM

## 2019-01-14 DIAGNOSIS — Z794 Long term (current) use of insulin: Principal | ICD-10-CM

## 2019-01-14 DIAGNOSIS — M5441 Lumbago with sciatica, right side: Secondary | ICD-10-CM

## 2019-01-14 DIAGNOSIS — F418 Other specified anxiety disorders: Secondary | ICD-10-CM

## 2019-01-14 DIAGNOSIS — E1165 Type 2 diabetes mellitus with hyperglycemia: Principal | ICD-10-CM

## 2019-01-14 DIAGNOSIS — G8929 Other chronic pain: Secondary | ICD-10-CM

## 2019-01-14 MED FILL — MELOXICAM 15 MG TABLET: 15 | 30 days supply | Qty: 30 | Fill #5

## 2019-01-14 MED FILL — $JANUVIA 100 MG TABLET: 100 | 30 days supply | Qty: 30 | Fill #2

## 2019-01-14 MED FILL — LISINOPRIL 40 MG TABLET: 40 | 30 days supply | Qty: 30 | Fill #1

## 2019-01-14 MED FILL — OMEPRAZOLE 20 MG CAP: 20 | 30 days supply | Qty: 30 | Fill #2

## 2019-01-15 MED FILL — GLIMEPIRIDE 4 MG TABS: 4 | 30 days supply | Qty: 30 | Fill #0

## 2019-01-15 MED FILL — traZODone HCL 50 MG TABS: 50 | 30 days supply | Qty: 30 | Fill #0

## 2019-01-26 ENCOUNTER — Ambulatory Visit: Payer: Self-pay | Admitting: Internal Medicine

## 2019-02-20 ENCOUNTER — Other Ambulatory Visit: Payer: Self-pay | Admitting: Physician Assistant

## 2019-02-20 ENCOUNTER — Other Ambulatory Visit: Payer: Self-pay | Admitting: Internal Medicine

## 2019-02-20 DIAGNOSIS — IMO0002 Reserved for concepts with insufficient information to code with codable children: Secondary | ICD-10-CM

## 2019-02-20 DIAGNOSIS — M545 Low back pain: Secondary | ICD-10-CM

## 2019-02-20 DIAGNOSIS — E1140 Type 2 diabetes mellitus with diabetic neuropathy, unspecified: Secondary | ICD-10-CM

## 2019-02-20 DIAGNOSIS — E1165 Type 2 diabetes mellitus with hyperglycemia: Principal | ICD-10-CM

## 2019-02-20 DIAGNOSIS — G8929 Other chronic pain: Secondary | ICD-10-CM

## 2019-02-20 DIAGNOSIS — Z794 Long term (current) use of insulin: Principal | ICD-10-CM

## 2019-02-20 DIAGNOSIS — K219 Gastro-esophageal reflux disease without esophagitis: Secondary | ICD-10-CM

## 2019-02-20 DIAGNOSIS — F418 Other specified anxiety disorders: Secondary | ICD-10-CM

## 2019-02-20 MED FILL — LISINOPRIL 40 MG TABLET: 40 | 30 days supply | Qty: 30 | Fill #2

## 2019-02-23 MED FILL — MELOXICAM 15 MG TABLET: 15 | 30 days supply | Qty: 30 | Fill #0

## 2019-02-23 MED FILL — GLIMEPIRIDE 4 MG TABS: 4 | 30 days supply | Qty: 30 | Fill #0

## 2019-02-23 MED FILL — OMEPRAZOLE 20 MG CAP: 20 | 30 days supply | Qty: 30 | Fill #0

## 2019-02-23 MED FILL — traZODone HCL 50 MG TABS: 50 | 30 days supply | Qty: 30 | Fill #0

## 2019-02-23 MED FILL — $JANUVIA 100 MG TABLET: 100 | 30 days supply | Qty: 30 | Fill #0

## 2019-02-25 MED FILL — !LANTUS SOLOSTAR 100UNITS/M: 100 | 15 days supply | Qty: 9 | Fill #1

## 2019-02-27 ENCOUNTER — Ambulatory Visit: Payer: Self-pay | Admitting: Internal Medicine

## 2019-03-09 ENCOUNTER — Other Ambulatory Visit: Payer: Self-pay | Admitting: Internal Medicine

## 2019-03-09 DIAGNOSIS — F418 Other specified anxiety disorders: Secondary | ICD-10-CM

## 2019-05-02 ENCOUNTER — Other Ambulatory Visit: Payer: Self-pay | Admitting: Internal Medicine

## 2019-05-02 DIAGNOSIS — K219 Gastro-esophageal reflux disease without esophagitis: Secondary | ICD-10-CM

## 2019-05-02 DIAGNOSIS — IMO0002 Reserved for concepts with insufficient information to code with codable children: Secondary | ICD-10-CM

## 2019-05-02 DIAGNOSIS — E1140 Type 2 diabetes mellitus with diabetic neuropathy, unspecified: Secondary | ICD-10-CM

## 2019-05-02 DIAGNOSIS — G8929 Other chronic pain: Secondary | ICD-10-CM

## 2019-05-04 MED FILL — GLIMEPIRIDE 4 MG TABS: 4 | 30 days supply | Qty: 30 | Fill #0

## 2019-05-04 MED FILL — CYCLOBENZAPRINE 5 MG TABLET: 5 | 30 days supply | Qty: 60 | Fill #0

## 2019-05-04 MED FILL — OMEPRAZOLE 20 MG CAP: 20 | 30 days supply | Qty: 30 | Fill #0

## 2019-05-05 ENCOUNTER — Other Ambulatory Visit: Payer: Self-pay | Admitting: Internal Medicine

## 2019-05-05 DIAGNOSIS — E1140 Type 2 diabetes mellitus with diabetic neuropathy, unspecified: Secondary | ICD-10-CM

## 2019-05-05 DIAGNOSIS — IMO0002 Reserved for concepts with insufficient information to code with codable children: Secondary | ICD-10-CM

## 2019-05-05 MED ORDER — GABAPENTIN 300 MG PO CAPS: 300.0000 mg | ORAL_CAPSULE | Freq: Every day | ORAL | 2 refills | Status: DC

## 2019-05-05 MED FILL — $LANTUS SOLOSTAR 100 UNITS/: 100 | 25 days supply | Qty: 15 | Fill #0

## 2019-06-04 ENCOUNTER — Ambulatory Visit: Payer: Self-pay | Admitting: Internal Medicine

## 2019-06-15 ENCOUNTER — Other Ambulatory Visit: Payer: Self-pay | Admitting: Internal Medicine

## 2019-06-15 DIAGNOSIS — M542 Cervicalgia: Secondary | ICD-10-CM

## 2019-06-15 DIAGNOSIS — IMO0002 Reserved for concepts with insufficient information to code with codable children: Secondary | ICD-10-CM

## 2019-06-15 DIAGNOSIS — E1140 Type 2 diabetes mellitus with diabetic neuropathy, unspecified: Secondary | ICD-10-CM

## 2019-06-15 DIAGNOSIS — G8929 Other chronic pain: Secondary | ICD-10-CM

## 2019-06-15 MED FILL — GABAPENTIN 300 MG CAPSULE: 300 | 30 days supply | Qty: 30 | Fill #0

## 2019-06-17 MED FILL — GLIMEPIRIDE 4 MG TABS: 4 | 30 days supply | Qty: 30 | Fill #0

## 2019-06-17 MED FILL — $LANTUS SOLOSTAR 100 UNITS/: 100 | 25 days supply | Qty: 15 | Fill #0

## 2019-06-17 MED FILL — CYCLOBENZAPRINE 5 MG TABLET: 5 | 30 days supply | Qty: 60 | Fill #0

## 2019-06-18 ENCOUNTER — Ambulatory Visit: Payer: Self-pay | Admitting: Internal Medicine

## 2019-06-18 ENCOUNTER — Other Ambulatory Visit: Payer: Self-pay

## 2019-06-24 ENCOUNTER — Other Ambulatory Visit: Payer: Self-pay | Admitting: Internal Medicine

## 2019-06-24 DIAGNOSIS — K219 Gastro-esophageal reflux disease without esophagitis: Secondary | ICD-10-CM

## 2019-06-24 MED FILL — OMEPRAZOLE 20 MG CAP: 20 | 30 days supply | Qty: 30 | Fill #0

## 2019-07-03 MED FILL — OMEPRAZOLE 20 MG CAP: 20 | 30 days supply | Qty: 30 | Fill #0

## 2019-07-15 ENCOUNTER — Telehealth: Payer: Self-pay | Admitting: Internal Medicine

## 2019-07-15 NOTE — Telephone Encounter (Signed)
Call returned to Nancy [name] about her concern. She addresses the NO SHOW that is in her MyChart was incorrect. She recalls the day of her appointment with her PCP, she was asked to be at the office at 8:30. She arrived to be the 1st person in the office at 8:15  for an 8:30 appt. When she checked in, no one called her to come to the exam room for 1 hour and she then went to the front desk and was told that the appointment was changed. She states she was a bit confused why no one told her that when she checked in but instead allowed her to wait for 1 hour.  It inconvenienced her as well as her sister who provided transportation and was scheduled to have knee surgery.  Due to sister's health, she needed to cancel the appointment for 07/17/2019 but will call and reschedule.   She would like the No Show removed.  She does not wish for it to appear that she cannot keep her appointments when she calls for refills.   Apology given to Nancy [name]. Informed that the message will be routed to her PCP and the manager/ Practice Administrator will be made aware of the incident.

## 2019-07-15 NOTE — Telephone Encounter (Signed)
Please call pt [phone] about appt 07/06/19. Pt states inconsiderate situation and kindly requests return call. Please contact for service recovery .

## 2019-07-17 ENCOUNTER — Ambulatory Visit: Payer: Self-pay | Admitting: Internal Medicine

## 2019-08-27 ENCOUNTER — Other Ambulatory Visit: Payer: Self-pay

## 2019-08-27 ENCOUNTER — Ambulatory Visit: Payer: Self-pay | Attending: Internal Medicine | Admitting: Internal Medicine

## 2019-08-27 ENCOUNTER — Encounter: Payer: Self-pay | Admitting: Internal Medicine

## 2019-08-27 VITALS — BP 171/122 | HR 101 | Temp 98.1°F | Resp 16 | Wt 201.4 lb

## 2019-08-27 DIAGNOSIS — M5416 Radiculopathy, lumbar region: Secondary | ICD-10-CM

## 2019-08-27 DIAGNOSIS — E1165 Type 2 diabetes mellitus with hyperglycemia: Secondary | ICD-10-CM

## 2019-08-27 DIAGNOSIS — Z5320 Procedure and treatment not carried out because of patient's decision for unspecified reasons: Secondary | ICD-10-CM

## 2019-08-27 DIAGNOSIS — I10 Essential (primary) hypertension: Secondary | ICD-10-CM

## 2019-08-27 DIAGNOSIS — H25013 Cortical age-related cataract, bilateral: Secondary | ICD-10-CM

## 2019-08-27 DIAGNOSIS — E785 Hyperlipidemia, unspecified: Secondary | ICD-10-CM

## 2019-08-27 DIAGNOSIS — G8929 Other chronic pain: Secondary | ICD-10-CM

## 2019-08-27 DIAGNOSIS — IMO0002 Reserved for concepts with insufficient information to code with codable children: Secondary | ICD-10-CM

## 2019-08-27 DIAGNOSIS — E1140 Type 2 diabetes mellitus with diabetic neuropathy, unspecified: Secondary | ICD-10-CM

## 2019-08-27 DIAGNOSIS — Z794 Long term (current) use of insulin: Secondary | ICD-10-CM

## 2019-08-27 DIAGNOSIS — R269 Unspecified abnormalities of gait and mobility: Secondary | ICD-10-CM

## 2019-08-27 DIAGNOSIS — Z9181 History of falling: Secondary | ICD-10-CM

## 2019-08-27 LAB — POCT GLYCOSYLATED HEMOGLOBIN (HGB A1C): HbA1c, POC (controlled diabetic range): 12.5 % — AB (ref 0.0–7.0)

## 2019-08-27 MED ORDER — GABAPENTIN 300 MG PO CAPS: 300.0000 mg | ORAL_CAPSULE | Freq: Every day | ORAL | 2 refills | Status: DC

## 2019-08-27 MED ORDER — NOVOLOG FLEXPEN 100 UNIT/ML ~~LOC~~ SOPN: PEN_INJECTOR | SUBCUTANEOUS | 11 refills | Status: DC

## 2019-08-27 MED ORDER — LISINOPRIL 40 MG PO TABS: 40.0000 mg | ORAL_TABLET | Freq: Every day | ORAL | 2 refills | Status: DC

## 2019-08-27 MED ORDER — HYDROCHLOROTHIAZIDE 12.5 MG PO TABS: 12.5000 mg | ORAL_TABLET | Freq: Every day | ORAL | 3 refills | Status: DC

## 2019-08-27 MED ORDER — DULOXETINE HCL 20 MG PO CPEP: 20.0000 mg | ORAL_CAPSULE | Freq: Every day | ORAL | 2 refills | Status: DC

## 2019-08-27 MED ORDER — LANTUS SOLOSTAR 100 UNIT/ML ~~LOC~~ SOPN: 65.0000 [IU] | PEN_INJECTOR | Freq: Every day | SUBCUTANEOUS | 0 refills | Status: DC

## 2019-08-27 MED FILL — DULoxetine HCL 20 MG CPEP: 20 | 30 days supply | Qty: 30 | Fill #0

## 2019-08-27 MED FILL — HYDROCHLOROTHIAZIDE 12.5 MG: 12.5 | 30 days supply | Qty: 30 | Fill #0

## 2019-08-27 NOTE — Patient Instructions (Addendum)
Your diabetes is not controlled.  Increase Lantus to 65 units daily.  We have added NovoLog.  Take 7 units of the NovoLog with breakfast and dinner.  Stop the Amaryl.  Continue to work on improving her eating habits.  Your blood pressure is not controlled.  Please take the lisinopril daily as prescribed.  We have added a low-dose of another blood pressure medication called hydrochlorothiazide that will help decrease the swelling in the legs.  Continue gabapentin.  We have added the Cymbalta which will help with the neuropathic pain and lower back pain.  Please apply for the orange card/cone discount.  Once you have been approved we can do further work-up and get you in with specialists.  I would also like to refer you for some physical therapy.    Fall Prevention in the Home, Adult Falls can cause injuries. They can happen to people of all ages. There are many things you can do to make your home safe and to help prevent falls. Ask for help when making these changes, if needed. What actions can I take to prevent falls? General Instructions  Use good lighting in all rooms. Replace any light bulbs that burn out.  Turn on the lights when you go into a dark area. Use night-lights.  Keep items that you use often in easy-to-reach places. Lower the shelves around your home if necessary.  Set up your furniture so you have a clear path. Avoid moving your furniture around.  Do not have throw rugs and other things on the floor that can make you trip.  Avoid walking on wet floors.  If any of your floors are uneven, fix them.  Add color or contrast paint or tape to clearly mark and help you see: ? Any grab bars or handrails. ? First and last steps of stairways. ? Where the edge of each step is.  If you use a stepladder: ? Make sure that it is fully opened. Do not climb a closed stepladder. ? Make sure that both sides of the stepladder are locked into place. ? Ask someone to hold the stepladder  for you while you use it.  If there are any pets around you, be aware of where they are. What can I do in the bathroom?      Keep the floor dry. Clean up any water that spills onto the floor as soon as it happens.  Remove soap buildup in the tub or shower regularly.  Use non-skid mats or decals on the floor of the tub or shower.  Attach bath mats securely with double-sided, non-slip rug tape.  If you need to sit down in the shower, use a plastic, non-slip stool.  Install grab bars by the toilet and in the tub and shower. Do not use towel bars as grab bars. What can I do in the bedroom?  Make sure that you have a light by your bed that is easy to reach.  Do not use any sheets or blankets that are too big for your bed. They should not hang down onto the floor.  Have a firm chair that has side arms. You can use this for support while you get dressed. What can I do in the kitchen?  Clean up any spills right away.  If you need to reach something above you, use a strong step stool that has a grab bar.  Keep electrical cords out of the way.  Do not use floor polish or wax that makes  floors slippery. If you must use wax, use non-skid floor wax. What can I do with my stairs?  Do not leave any items on the stairs.  Make sure that you have a light switch at the top of the stairs and the bottom of the stairs. If you do not have them, ask someone to add them for you.  Make sure that there are handrails on both sides of the stairs, and use them. Fix handrails that are broken or loose. Make sure that handrails are as long as the stairways.  Install non-slip stair treads on all stairs in your home.  Avoid having throw rugs at the top or bottom of the stairs. If you do have throw rugs, attach them to the floor with carpet tape.  Choose a carpet that does not hide the edge of the steps on the stairway.  Check any carpeting to make sure that it is firmly attached to the stairs. Fix any  carpet that is loose or worn. What can I do on the outside of my home?  Use bright outdoor lighting.  Regularly fix the edges of walkways and driveways and fix any cracks.  Remove anything that might make you trip as you walk through a door, such as a raised step or threshold.  Trim any bushes or trees on the path to your home.  Regularly check to see if handrails are loose or broken. Make sure that both sides of any steps have handrails.  Install guardrails along the edges of any raised decks and porches.  Clear walking paths of anything that might make someone trip, such as tools or rocks.  Have any leaves, snow, or ice cleared regularly.  Use sand or salt on walking paths during winter.  Clean up any spills in your garage right away. This includes grease or oil spills. What other actions can I take?  Wear shoes that: ? Have a low heel. Do not wear high heels. ? Have rubber bottoms. ? Are comfortable and fit you well. ? Are closed at the toe. Do not wear open-toe sandals.  Use tools that help you move around (mobility aids) if they are needed. These include: ? Canes. ? Walkers. ? Scooters. ? Crutches.  Review your medicines with your doctor. Some medicines can make you feel dizzy. This can increase your chance of falling. Ask your doctor what other things you can do to help prevent falls. Where to find more information  Centers for Disease Control and Prevention, [employer]AD[employer]: https://garcia.biz/  [employer] on Aging: BrainJudge.co.uk Contact a doctor if:  You are afraid of falling at home.  You feel weak, drowsy, or dizzy at home.  You fall at home. Summary  There are many simple things that you can do to make your home safe and to help prevent falls.  Ways to make your home safe include removing tripping hazards and installing grab bars in the bathroom.  Ask for help when making these changes in your home. This information is not intended to  replace advice given to you by your health care provider. Make sure you discuss any questions you have with your health care provider. Document Released: 09/29/2009 Document Revised: 03/26/2019 Document Reviewed: 07/18/2017 Elsevier Patient Education  2020 [employer].

## 2019-08-27 NOTE — Progress Notes (Signed)
Patient ID: Nancy [name], female    DOB: Jun 05, 1961  MRN: [id number]  CC: No chief complaint on file.   Subjective: Nancy [name] is a 58 y.o. 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  Had appt 06/2019 but appt time was mixed up.   C/o balance being off and she has fallen 3 times over last 4 mths.   - Last fall episode was 3 wks - was coming out of grocery store with 2 bags in her hands.  A 2 lt soda dropped out of the bag and she bent over to pick it up and "I just kept going." -feels her knees give out sometimes.  No pain or swelling in knees recently.  Last was 6 mths ago -chronic lower back pain. Sometimes when sciatica flares up, she gets pain radiating down RT leg.  Cannot tell if she is having a flare of sciatica at this time. -ambulates with cane intermittently x 3 yrs.  More constantly x 4 mths -does not drive any more.  Has significant cataracts BL impeding her vision and needs surgery.  Applied ot services for the blind for financial help in getting the surgery done . Hopes to hear back in next 30 days -having a lot of nerve pain in feet and hands.  Gabapentin not helping any more.  She is on 300 mg at nights.  She feels she may need a higher dose  DM: checking BS in afternoons.  Last check yesterday afternoon and it was 455.  BS usually in high 200 Eating habits - changed to Diet Dr. Malachi [name], eating more chicken and Salmon.   Taking Lantus 55 units and Amaryl. Out of Januvia since April.  Can not tolerate Metformin - "causes a cutting glass feeling in my stomach."  HTN:  Only on Lisinopril which she takes at nights but forgot to take las night.  Out Norvasc x 1 yr.  Hydralazine causes hair loss.  Endorses HA and dizziness No  CP/SOB. + edema in ankles   HL: Not taking atorvastatin.  She states that she does not want to be on a statin.  Patient Active Problem List   Diagnosis Date Noted  . Gastroesophageal reflux disease without esophagitis 04/18/2018  . 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 Uncontrolled type 2 diabetes mellitus with diabetic neuropathy, with long-term current use of insulin ([place]) 10/29/2017  . History of macular degeneration 10/29/2017  . Dermatitis 10/29/2016  . Chronic cervical pain 10/29/2016  . Myalgia 09/14/2016  . Insomnia 09/14/2016  . Tinea pedis of both feet 08/20/2016  . Macular degeneration 08/17/2016  . Low back pain 09/21/2014  . Hypertension 09/21/2014  . Diabetic gastroparesis associated with type 2 diabetes mellitus ([place]) 09/14/2014  . Thyroid nodule 08/17/2014  . Obesity (BMI 30-39.9) 08/17/2014  . Hyperlipidemia 08/05/2014  . Type II diabetes mellitus with neurological manifestations, uncontrolled ([place]) 08/04/2014  . Elevated serum protein level 08/04/2014     Current Outpatient Medications on File Prior to Visit  Medication Sig Dispense Refill  . acetaminophen (TYLENOL) 500 MG tablet Take 2 tablets (1,000 mg total) by mouth every 8 (eight) hours as needed for mild pain or moderate pain. 30 tablet 2  . amLODipine (NORVASC) 10 MG tablet Take 1 tablet (10 mg total) by  mouth daily. 30 tablet 2  . atorvastatin (LIPITOR) 40 MG tablet Take 1 tablet (40 mg total) daily by mouth. 90 tablet 1  . buPROPion (WELLBUTRIN SR) 150 MG 12 hr tablet Take 1 tablet (150 mg total) by mouth 2 (two) times daily. 60 tablet 2  . cyclobenzaprine (FLEXERIL) 5 MG tablet TAKE 1 TABLET (5 MG TOTAL) BY MOUTH 2 (TWO) TIMES DAILY AS NEEDED FOR MUSCLE SPASMS. MUST MAKE APPT FOR FURTHER REFILLS 60 tablet 0  . diclofenac sodium (VOLTAREN) 1 % GEL Apply 4 g topically 4 (four) times daily. (Patient not taking: Reported on 10/07/2018) 100 g 3  . DULoxetine (CYMBALTA) 20 MG capsule Take 1 capsule (20 mg total) by  mouth daily. (Patient not taking: Reported on 10/07/2018) 30 capsule 2  . gabapentin (NEURONTIN) 300 MG capsule Take 1 capsule (300 mg total) by mouth at bedtime. 30 capsule 2  . glimepiride (AMARYL) 4 MG tablet TAKE 1 TABLET (4 MG TOTAL) BY MOUTH DAILY WITH BREAKFAST. MUST MAKE APPT FOR FURTHER REFILLS 30 tablet 0  . hydrALAZINE (APRESOLINE) 100 MG tablet Take 1 tablet (100 mg total) by mouth every 8 (eight) hours. (Patient not taking: Reported on 10/07/2018) 90 tablet 2  . Insulin Pen Needle (PEN NEEDLES) 32G X 4 MM MISC 1 each by Does not apply route 4 (four) times daily. 120 each 11  . JANUVIA 100 MG tablet TAKE 1 TABLET (100 MG TOTAL) BY MOUTH DAILY. 30 tablet 0  . ketoconazole (NIZORAL) 2 % cream Apply 1 application topically daily. To rash between toes (Patient not taking: Reported on 10/07/2018) 15 g 0  . LANTUS SOLOSTAR 100 UNIT/ML Solostar Pen INJECT 60 UNITS INTO THE SKIN DAILY. 15 mL 0  . lisinopril (PRINIVIL,ZESTRIL) 40 MG tablet Take 1 tablet (40 mg total) by mouth daily. 30 tablet 2  . meloxicam (MOBIC) 15 MG tablet TAKE 1 TABLET BY MOUTH DAILY. 30 tablet 0  . mupirocin ointment (BACTROBAN) 2 % Apply bid to toe X 1 week (Patient not taking: Reported on 10/07/2018) 22 g 0  . naproxen (NAPROSYN) 500 MG tablet Take 1 tablet (500 mg total) by mouth 2 (two) times daily with a meal. (Patient not taking: Reported on 03/06/2018) 30 tablet 0  . omeprazole (PRILOSEC) 20 MG capsule TAKE 1 CAPSULE (20 MG TOTAL) BY MOUTH DAILY. 30 capsule 0  . polyethylene glycol powder (GLYCOLAX/MIRALAX) powder Take 17 g by mouth daily. (Patient not taking: Reported on 10/07/2018) 3350 g 1  . promethazine-dextromethorphan (PROMETHAZINE-DM) 6.25-15 MG/5ML syrup Take 5 mLs by mouth 4 (four) times daily as needed for cough. 118 mL 0  . Skin Protectants, Misc. (EUCERIN) cream Apply topically as needed for dry skin. (Patient not taking: Reported on 10/07/2018) 454 g 0  . traZODone (DESYREL) 50 MG tablet TAKE 1/2-1  TABLETS (25-50 MG TOTAL) BY MOUTH AT BEDTIME AS NEEDED FOR SLEEP. MUST MAKE APPT FOR FURTHER REFILLS 30 tablet 2  . [DISCONTINUED] Insulin NPH Isophane & Regular (RELION 70/30 [place]) Inject 35 Units into the skin 2 (two) times daily.     Current Facility-Administered Medications on File Prior to Visit  Medication Dose Route Frequency Provider Last Rate Last Dose  . bacitracin ointment 1 application  1 application Topical Once Alfonse Spruce, FNP      . neomycin-bacitracin-polymyxin (NEOSPORIN) ointment   Topical Once Alfonse Spruce, FNP        Allergies  Allergen Reactions  . Hydrocodone Other (See Comments)    Upset stomach  .  Metformin And Related Nausea And Vomiting and Other (See Comments)    Stomach pains   . Plaquenil [Hydroxychloroquine Sulfate] Hives  . Sulfur Hives    Social History   Socioeconomic History  . Marital status: Married    Spouse name: [name]  . Number of children: 1  . Years of education: some colle  . Highest education level: Not on file  Occupational History  . Occupation: Employed FT as Landscape architect    Comment: [employer]   Social Needs  . Financial resource strain: Not on file  . Food insecurity    Worry: Not on file    Inability: Not on file  . Transportation needs    Medical: Not on file    Non-medical: Not on file  Tobacco Use  . Smoking status: Former Smoker    Packs/day: 0.25    Years: 0.50    Pack years: 0.12    Types: Cigarettes  . Smokeless tobacco: Never Used  Substance and Sexual Activity  . Alcohol use: No    Frequency: Never    Comment: occasion   . Drug use: No  . Sexual activity: Yes    Birth control/protection: None  Lifestyle  . Physical activity    Days per week: Not on file    Minutes per session: Not on file  . Stress: Not on file  Relationships  . Social Herbalist on phone: Not on file    Gets together: Not on file    Attends religious service: Not on file    Active member of club or  organization: Not on file    Attends meetings of clubs or organizations: Not on file    Relationship status: Not on file  . Intimate partner violence    Fear of current or ex partner: Not on file    Emotionally abused: Not on file    Physically abused: Not on file    Forced sexual activity: Not on file  Other Topics Concern  . Not on file  Social History Narrative   Married.  Lives with husband and daughter.  Works full time in Engineer, structural (high stress).   Caring for mother who's health is failing, starting dialysis.     Family History  Problem Relation Age of Onset  . Hypertension Mother   . Heart disease Mother   . Diabetes Mother   . Thyroid disease Mother   . Congestive Heart Failure Mother   . Breast cancer Maternal Grandmother   . Colon cancer Maternal Grandfather   . Heart attack Sister   . Heart disease Brother   . Hyperlipidemia Brother   . Hypertension Brother   . Diabetes Father   . Breast cancer Maternal Aunt     Past Surgical History:  Procedure Laterality Date  . ABDOMINAL HYSTERECTOMY  2005  . CESAREAN SECTION  1983   . ESOPHAGOGASTRODUODENOSCOPY (EGD) WITH PROPOFOL Left 08/26/2014   Procedure: ESOPHAGOGASTRODUODENOSCOPY (EGD) WITH PROPOFOL;  Surgeon: Arta Silence, MD;  Location: [facility];  Service: Endoscopy;  Laterality: Left;    ROS: Review of Systems Negative except as stated above  PHYSICAL EXAM: There were no vitals taken for this visit.  Physical Exam  General appearance - alert, well appearing, and in no distress Mental status - normal mood, behavior, speech, dress, motor activity, and thought processes Mouth - mucous membranes moist, pharynx normal without lesions Neck - supple, no significant adenopathy Chest - clear to auscultation, no wheezes, rales or  rhonchi, symmetric air entry Heart - normal rate, regular rhythm, normal S1, S2, no murmurs, rubs, clicks or gallops Neurological -gait is slowed with decreased foot  to floor clearance.  She ambulates with a cane using the right hand.  Power in the upper extremities 5/5 bilaterally.  Power in the lower extremities 5/5 bilaterally. Musculoskeletal -mild tenderness on palpation of the lumbar spine. Extremities -1+ bilateral lower extremity edema Diabetic Foot Exam - Simple   Simple Foot Form Visual Inspection See comments: Yes Sensation Testing See comments: Yes Pulse Check See comments: Yes Comments Patient has peeling with discoloration of the left heel.  Toenails on both big toes are partially broken off or have been removed.  Dorsalis pedis pulses 2+ bilaterally.  She has decreased sensation on the soles of both feet     CMP Latest Ref Rng & Units 10/06/2018 08/27/2018 11/13/2017  Glucose 70 - 99 mg/dL 431(H) 354(H) 549(HH)  BUN 6 - 20 mg/dL 14 28(H) 22(H)  Creatinine 0.44 - 1.00 mg/dL 1.47(H) 1.29(H) 1.30(H)  Sodium 135 - 145 mmol/L 131(L) 138 134(L)  Potassium 3.5 - 5.1 mmol/L 4.8 4.6 4.2  Chloride 98 - 111 mmol/L 96(L) 97 96(L)  CO2 22 - 32 mmol/L 25 23 -  Calcium 8.9 - 10.3 mg/dL 9.4 10.0 -  Total Protein 6.0 - 8.5 g/dL - 7.1 -  Total Bilirubin 0.0 - 1.2 mg/dL - 0.2 -  Alkaline Phos 39 - 117 IU/L - 101 -  AST 0 - 40 IU/L - 16 -  ALT 0 - 32 IU/L - 21 -   Lipid Panel     Component Value Date/Time   CHOL 232 (H) 10/29/2017 1230   TRIG 116 10/29/2017 1230   HDL 61 10/29/2017 1230   CHOLHDL 3.8 10/29/2017 1230   CHOLHDL 4.9 07/24/2016 0028   VLDL 23 07/24/2016 0028   LDLCALC 148 (H) 10/29/2017 1230    CBC    Component Value Date/Time   WBC 8.7 10/06/2018 1619   RBC 4.58 10/06/2018 1619   HGB 12.6 10/06/2018 1619   HGB 12.2 08/27/2018 1131   HGB 12.5 07/08/2008 1551   HCT 39.7 10/06/2018 1619   HCT 37.0 08/27/2018 1131   HCT 37.3 07/08/2008 1551   PLT 288 10/06/2018 1619   PLT 269 08/27/2018 1131   MCV 86.7 10/06/2018 1619   MCV 85 08/27/2018 1131   MCV 84.7 07/08/2008 1551   MCH 27.5 10/06/2018 1619   MCHC 31.7  10/06/2018 1619   RDW 12.3 10/06/2018 1619   RDW 12.2 (L) 08/27/2018 1131   RDW 13.0 07/08/2008 1551   LYMPHSABS 3.9 (H) 08/27/2018 1131   LYMPHSABS 4.6 (H) 07/08/2008 1551   MONOABS 0.6 11/13/2017 0826   MONOABS 0.6 07/08/2008 1551   EOSABS 0.8 (H) 08/27/2018 1131   BASOSABS 0.1 08/27/2018 1131   BASOSABS 0.1 07/08/2008 1551   Results for orders placed or performed in visit on 08/27/19  POCT glycosylated hemoglobin (Hb A1C)  Result Value Ref Range   Hemoglobin A1C     HbA1c POC (<> result, manual entry)     HbA1c, POC (prediabetic range)     HbA1c, POC (controlled diabetic range) 12.5 (A) 0.0 - 7.0 %    ASSESSMENT AND PLAN: 1. Uncontrolled type 2 diabetes mellitus with diabetic neuropathy, with long-term current use of insulin ([place]) -Encourage regular follow-up visits. Discussed the importance of healthy eating habits.  Dietary counseling given. Increase Lantus to 65 units daily.  Stop Amaryl.  Add NovoLog 7  units with breakfast and dinner.  Advised to continue monitoring blood sugars. -Waiting to hear from services for the blind to get assistance with having cataract surgery Advised against [place] increasing gabapentin as it may cause further increase in lower extremity edema.  Instead I recommend Cymbalta which she was prescribed in the past but had not taken.  She is willing to give it a try. - POCT glycosylated hemoglobin (Hb A1C) - DULoxetine (CYMBALTA) 20 MG capsule; Take 1 capsule (20 mg total) by mouth daily.  Dispense: 30 capsule; Refill: 2 - CBC - Comprehensive metabolic panel - Lipid panel - Insulin Glargine (LANTUS SOLOSTAR) 100 UNIT/ML Solostar Pen; Inject 65 Units into the skin at bedtime.  Dispense: 15 mL; Refill: 0 - insulin aspart (NOVOLOG FLEXPEN) 100 UNIT/ML FlexPen; 7 units subcut with breakfast and dinner  Dispense: 15 mL; Refill: 11 - gabapentin (NEURONTIN) 300 MG capsule; Take 1 capsule (300 mg total) by mouth at bedtime.  Dispense: 30 capsule; Refill: 2  2.  Essential hypertension Not at goal.  Discussed health risks associated with uncontrolled blood pressure.  Encouraged her to take lisinopril as prescribed.  I have added low-dose HCTZ due to lower extremity edema - hydrochlorothiazide (HYDRODIURIL) 12.5 MG tablet; Take 1 tablet (12.5 mg total) by mouth daily.  Dispense: 90 tablet; Refill: 3 - lisinopril (ZESTRIL) 40 MG tablet; Take 1 tablet (40 mg total) by mouth daily.  Dispense: 30 tablet; Refill: 2  3. Gait abnormality 4. Personal history of fall Likely multifactorial including diabetic neuropathy, poor vision and chronic lower back issues. -Advised to apply for the orange card/cone discount.  We can refer to physical therapy once she is approved.  5. Cortical age-related cataract of both eyes See #1 above  6. Hyperlipidemia, unspecified hyperlipidemia type Patient declines statin therapy.  Discussed health benefits of statin but she is still not wanting to be on it.  7. Chronic radicular pain of lower back Continue low-dose gabapentin.  Add low-dose Cymbalta.  Last time she had MRI was back in 2018 - DG Lumbar Spine Complete; Future  8. Statin declined See #6 above    Patient was given the opportunity to ask questions.  Patient verbalized understanding of the plan and was able to repeat key elements of the plan.   Orders Placed This Encounter  Procedures  . POCT glycosylated hemoglobin (Hb A1C)     Requested Prescriptions    No prescriptions requested or ordered in this encounter    No follow-ups on file.  [name], MD, FACP

## 2019-08-28 LAB — CBC
Hematocrit: 39.2 % (ref 34.0–46.6)
Hemoglobin: 12.7 g/dL (ref 11.1–15.9)
MCH: 28.2 pg (ref 26.6–33.0)
MCHC: 32.4 g/dL (ref 31.5–35.7)
MCV: 87 fL (ref 79–97)
Platelets: 276 10*3/uL (ref 150–450)
RBC: 4.5 x10E6/uL (ref 3.77–5.28)
RDW: 11.9 % (ref 11.7–15.4)
WBC: 12.7 10*3/uL — ABNORMAL HIGH (ref 3.4–10.8)

## 2019-08-28 LAB — COMPREHENSIVE METABOLIC PANEL
ALT: 15 IU/L (ref 0–32)
AST: 13 IU/L (ref 0–40)
Albumin/Globulin Ratio: 1.4 (ref 1.2–2.2)
Albumin: 4.4 g/dL (ref 3.8–4.9)
Alkaline Phosphatase: 83 IU/L (ref 39–117)
BUN/Creatinine Ratio: 19 (ref 9–23)
BUN: 22 mg/dL (ref 6–24)
Bilirubin Total: 0.2 mg/dL (ref 0.0–1.2)
CO2: 27 mmol/L (ref 20–29)
Calcium: 9.9 mg/dL (ref 8.7–10.2)
Chloride: 95 mmol/L — ABNORMAL LOW (ref 96–106)
Creatinine, Ser: 1.18 mg/dL — ABNORMAL HIGH (ref 0.57–1.00)
GFR calc Af Amer: 58 mL/min/{1.73_m2} — ABNORMAL LOW (ref >59)
GFR calc non Af Amer: 51 mL/min/{1.73_m2} — ABNORMAL LOW (ref >59)
Globulin, Total: 3.1 g/dL (ref 1.5–4.5)
Glucose: 470 mg/dL — ABNORMAL HIGH (ref 65–99)
Potassium: 4.9 mmol/L (ref 3.5–5.2)
Sodium: 135 mmol/L (ref 134–144)
Total Protein: 7.5 g/dL (ref 6.0–8.5)

## 2019-08-28 LAB — LIPID PANEL
Chol/HDL Ratio: 4.5 ratio — ABNORMAL HIGH (ref 0.0–4.4)
Cholesterol, Total: 236 mg/dL — ABNORMAL HIGH (ref 100–199)
HDL: 52 mg/dL (ref >39)
LDL Chol Calc (NIH): 151 mg/dL — ABNORMAL HIGH (ref 0–99)
Triglycerides: 180 mg/dL — ABNORMAL HIGH (ref 0–149)
VLDL Cholesterol Cal: 33 mg/dL (ref 5–40)

## 2019-08-28 MED FILL — $LANTUS SOLOSTAR 100 UNITS/: 100 | 27 days supply | Qty: 15 | Fill #0

## 2019-08-28 MED FILL — GABAPENTIN 300 MG CAPSULE: 300 | 30 days supply | Qty: 30 | Fill #0

## 2019-08-28 MED FILL — LISINOPRIL 40 MG TABLET: 40 | 30 days supply | Qty: 30 | Fill #0

## 2019-08-28 MED FILL — !NOVOLOG FLEXPEN SYRINGE 1: 100/ML | 21 days supply | Qty: 3 | Fill #0

## 2019-08-30 ENCOUNTER — Other Ambulatory Visit: Payer: Self-pay | Admitting: Internal Medicine

## 2019-08-30 DIAGNOSIS — H25013 Cortical age-related cataract, bilateral: Secondary | ICD-10-CM | POA: Insufficient documentation

## 2019-08-30 DIAGNOSIS — Z5320 Procedure and treatment not carried out because of patient's decision for unspecified reasons: Secondary | ICD-10-CM | POA: Insufficient documentation

## 2019-08-30 DIAGNOSIS — R269 Unspecified abnormalities of gait and mobility: Secondary | ICD-10-CM | POA: Insufficient documentation

## 2019-08-30 MED ORDER — EZETIMIBE 10 MG PO TABS: 10.0000 mg | ORAL_TABLET | Freq: Every day | ORAL | 3 refills | Status: DC

## 2019-08-31 MED FILL — EZETIMIBE 10 MG TAB: 10 | 30 days supply | Qty: 30 | Fill #0

## 2019-09-02 ENCOUNTER — Ambulatory Visit (HOSPITAL_COMMUNITY)
Admission: RE | Admit: 2019-09-02 | Discharge: 2019-09-02 | Disposition: A | Discharge status: Home / Self Care | Payer: Self-pay | Source: Ambulatory Visit | Attending: Internal Medicine | Admitting: Internal Medicine

## 2019-09-02 ENCOUNTER — Other Ambulatory Visit: Payer: Self-pay

## 2019-09-02 DIAGNOSIS — G8929 Other chronic pain: Secondary | ICD-10-CM | POA: Insufficient documentation

## 2019-09-02 DIAGNOSIS — M5416 Radiculopathy, lumbar region: Secondary | ICD-10-CM | POA: Insufficient documentation

## 2019-09-28 ENCOUNTER — Ambulatory Visit: Payer: Self-pay | Admitting: Internal Medicine

## 2019-10-07 ENCOUNTER — Emergency Department (HOSPITAL_COMMUNITY)
Admission: EM | Admit: 2019-10-07 | Discharge: 2019-10-09 | Disposition: A | Discharge status: Other Acute Inpatient Hospital | Payer: Self-pay | Attending: Emergency Medicine | Admitting: Emergency Medicine

## 2019-10-07 ENCOUNTER — Ambulatory Visit: Payer: Self-pay | Attending: Internal Medicine | Admitting: Licensed Clinical Social Worker

## 2019-10-07 ENCOUNTER — Other Ambulatory Visit: Payer: Self-pay

## 2019-10-07 ENCOUNTER — Telehealth: Payer: Self-pay | Admitting: Internal Medicine

## 2019-10-07 ENCOUNTER — Encounter (HOSPITAL_COMMUNITY): Payer: Self-pay | Admitting: Emergency Medicine

## 2019-10-07 DIAGNOSIS — R443 Hallucinations, unspecified: Secondary | ICD-10-CM | POA: Insufficient documentation

## 2019-10-07 DIAGNOSIS — Z87891 Personal history of nicotine dependence: Secondary | ICD-10-CM | POA: Insufficient documentation

## 2019-10-07 DIAGNOSIS — E119 Type 2 diabetes mellitus without complications: Secondary | ICD-10-CM | POA: Insufficient documentation

## 2019-10-07 DIAGNOSIS — Z79899 Other long term (current) drug therapy: Secondary | ICD-10-CM | POA: Insufficient documentation

## 2019-10-07 DIAGNOSIS — F29 Unspecified psychosis not due to a substance or known physiological condition: Secondary | ICD-10-CM

## 2019-10-07 DIAGNOSIS — Z8673 Personal history of transient ischemic attack (TIA), and cerebral infarction without residual deficits: Secondary | ICD-10-CM | POA: Insufficient documentation

## 2019-10-07 DIAGNOSIS — F321 Major depressive disorder, single episode, moderate: Secondary | ICD-10-CM | POA: Insufficient documentation

## 2019-10-07 DIAGNOSIS — Z794 Long term (current) use of insulin: Secondary | ICD-10-CM | POA: Insufficient documentation

## 2019-10-07 DIAGNOSIS — Z20828 Contact with and (suspected) exposure to other viral communicable diseases: Secondary | ICD-10-CM | POA: Insufficient documentation

## 2019-10-07 DIAGNOSIS — I10 Essential (primary) hypertension: Secondary | ICD-10-CM | POA: Insufficient documentation

## 2019-10-07 DIAGNOSIS — R441 Visual hallucinations: Secondary | ICD-10-CM

## 2019-10-07 LAB — RAPID URINE DRUG SCREEN, HOSP PERFORMED
Amphetamines: NOT DETECTED
Barbiturates: NOT DETECTED
Benzodiazepines: NOT DETECTED
Cocaine: NOT DETECTED
Opiates: NOT DETECTED
Tetrahydrocannabinol: NOT DETECTED

## 2019-10-07 NOTE — ED Provider Notes (Signed)
[place] EMERGENCY DEPARTMENT Provider Note   CSN: [id number] Arrival date & time: 10/07/19  1358     History   Chief Complaint Chief Complaint  Patient presents with  . Hallucinations    HPI Nancy [name] is a 58 y.o. female.     Patient presents to the emergency department with chief complaint of hallucinations.  She states that she is having injury about 2 months ago after she tripped and fell at home.  She states that it knocked her out, but she was never evaluated because she felt okay after work.  She states that several weeks later she developed hallucinations in which objects "all out of her eyes."  She states that she has delusions, needles, toothpicks, spiders, and worms fall out of her eyes.  She acknowledges that these hallucinations are not real, but is uncertain as to why she is having them.  She denies starting any new medications or using any drugs or alcohol.  She denies any other symptoms.  She has been seen by ophthalmology, and was told that she had cataracts, but no other ophthalmologic abnormalities.  The history is provided by the patient. No language interpreter was used.    Past Medical History:  Diagnosis Date  . Anemia 2006  . Depression 2014   previously on amitryptiline   . Diabetes mellitus with neurological manifestation ([place]) 2006  . Fracture of left ankle 1997   . Gastroparesis 07/2016  . HOH (hard of hearing) 2004   . Hyperlipidemia 2006  . Hypertension 2006  . IBS (irritable bowel syndrome) 2002   . Leukopenia 2015   . Shingles 2009   . Thyroid nodule 2004    Patient Active Problem List   Diagnosis Date Noted  . Cortical age-related cataract of both eyes 08/30/2019  . Gait abnormality 08/30/2019  . Statin declined 08/30/2019  . Gastroesophageal reflux disease without esophagitis 04/18/2018  . 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 Uncontrolled type 2 diabetes mellitus with diabetic neuropathy, with long-term current  use of insulin ([place]) 10/29/2017  . History of macular degeneration 10/29/2017  . Chronic cervical pain 10/29/2016  . Myalgia 09/14/2016  . Insomnia 09/14/2016  . Tinea pedis of both feet 08/20/2016  . Macular degeneration 08/17/2016  . Low back pain 09/21/2014  . Hypertension 09/21/2014  . Diabetic gastroparesis associated with type 2 diabetes mellitus ([place]) 09/14/2014  . Thyroid nodule 08/17/2014  . Obesity (BMI 30-39.9) 08/17/2014  . Hyperlipidemia 08/05/2014  . Type II diabetes mellitus with neurological manifestations, uncontrolled ([place]) 08/04/2014  . Elevated serum protein level 08/04/2014    Past Surgical History:  Procedure Laterality Date  . ABDOMINAL HYSTERECTOMY  2005  . CESAREAN SECTION  1983   . ESOPHAGOGASTRODUODENOSCOPY (EGD) WITH PROPOFOL Left 08/26/2014   Procedure: ESOPHAGOGASTRODUODENOSCOPY (EGD) WITH PROPOFOL;  Surgeon: Arta Silence, MD;  Location: [facility];  Service: Endoscopy;  Laterality: Left;     OB History   No obstetric history on file.      Home Medications    Prior to Admission medications   Medication Sig Start Date End Date Taking? Authorizing Provider  acetaminophen (TYLENOL) 500 MG tablet Take 2 tablets (1,000 mg total) by mouth every 8 (eight) hours as needed for mild pain or moderate pain. 02/03/18   Alfonse Spruce, FNP  cyclobenzaprine (FLEXERIL) 5 MG tablet TAKE 1 TABLET (5 MG TOTAL) BY MOUTH 2 (TWO) TIMES DAILY AS NEEDED FOR MUSCLE SPASMS. MUST MAKE APPT FOR FURTHER REFILLS 06/16/19   Wynetta Emery,  Dalbert Batman, MD  diclofenac sodium (VOLTAREN) 1 % GEL Apply 4 g topically 4 (four) times daily. Patient not taking: Reported on 10/07/2018 09/14/16   Boykin Nearing, MD  DULoxetine (CYMBALTA) 20 MG capsule Take 1 capsule (20 mg total) by mouth daily. 08/27/19   [name], MD  ezetimibe (ZETIA) 10 MG tablet Take 1 tablet (10 mg total) by mouth daily. 08/30/19   [name], MD  gabapentin (NEURONTIN) 300 MG capsule Take 1 capsule  (300 mg total) by mouth at bedtime. 08/27/19   [name], MD  hydrochlorothiazide (HYDRODIURIL) 12.5 MG tablet Take 1 tablet (12.5 mg total) by mouth daily. 08/27/19   [name], MD  insulin aspart (NOVOLOG FLEXPEN) 100 UNIT/ML FlexPen 7 units subcut with breakfast and dinner 08/27/19   [name], MD  Insulin Glargine (LANTUS SOLOSTAR) 100 UNIT/ML Solostar Pen Inject 65 Units into the skin at bedtime. 08/27/19   [name], MD  Insulin Pen Needle (PEN NEEDLES) 32G X 4 MM MISC 1 each by Does not apply route 4 (four) times daily. 12/04/16   Funches, Adriana Mccallum, MD  lisinopril (ZESTRIL) 40 MG tablet Take 1 tablet (40 mg total) by mouth daily. 08/27/19   [name], MD  omeprazole (PRILOSEC) 20 MG capsule TAKE 1 CAPSULE (20 MG TOTAL) BY MOUTH DAILY. 06/24/19   [name], MD  traZODone (DESYREL) 50 MG tablet TAKE 1/2-1 TABLETS (25-50 MG TOTAL) BY MOUTH AT BEDTIME AS NEEDED FOR SLEEP. MUST MAKE APPT FOR FURTHER REFILLS 03/09/19   [name], MD  Insulin NPH Isophane & Regular (RELION 70/30 [place]) Inject 35 Units into the skin 2 (two) times daily.  08/17/14  [provider]    Family History Family History  Problem Relation Age of Onset  . Hypertension Mother   . Heart disease Mother   . Diabetes Mother   . Thyroid disease Mother   . Congestive Heart Failure Mother   . Breast cancer Maternal Grandmother   . Colon cancer Maternal Grandfather   . Heart attack Sister   . Heart disease Brother   . Hyperlipidemia Brother   . Hypertension Brother   . Diabetes Father   . Breast cancer Maternal Aunt     Social History Social History   Tobacco Use  . Smoking status: Former Smoker    Packs/day: 0.25    Years: 0.50    Pack years: 0.12    Types: Cigarettes  . Smokeless tobacco: Never Used  Substance Use Topics  . Alcohol use: No    Frequency: Never    Comment: occasion   . Drug use: No     Allergies   Hydrocodone, Metformin and  related, Plaquenil [hydroxychloroquine sulfate], and Sulfur   Review of Systems Review of Systems  All other systems reviewed and are negative.    Physical Exam Updated Vital Signs BP (!) 181/99 (BP Location: Right Arm)   Pulse 95   Temp 99 F (37.2 C) (Oral)   Resp 16   SpO2 100%   Physical Exam Vitals signs and nursing note reviewed.  Constitutional:      General: She is not in acute distress.    Appearance: She is well-developed.     Comments: Actively pinching the area in front of her eyes, attempting to remove objects from her eyes, but without actually touching  HENT:     Head: Normocephalic and atraumatic.  Eyes:     Conjunctiva/sclera: Conjunctivae normal.  Neck:  Musculoskeletal: Neck supple.  Cardiovascular:     Rate and Rhythm: Normal rate and regular rhythm.     Heart sounds: No murmur.  Pulmonary:     Effort: Pulmonary effort is normal. No respiratory distress.     Breath sounds: Normal breath sounds.  Abdominal:     Palpations: Abdomen is soft.     Tenderness: There is no abdominal tenderness.  Skin:    General: Skin is warm and dry.  Neurological:     Mental Status: She is alert.  Psychiatric:        Mood and Affect: Mood normal.        Behavior: Behavior normal.        Thought Content: Thought content normal.        Judgment: Judgment normal.     Comments: All questions appropriately, seems appropriately concerned about her hallucinations      ED Treatments / Results  Labs (all labs ordered are listed, but only abnormal results are displayed) Labs Reviewed  RAPID URINE DRUG SCREEN, HOSP PERFORMED  CBC WITH DIFFERENTIAL/PLATELET  COMPREHENSIVE METABOLIC PANEL  ACETAMINOPHEN LEVEL  SALICYLATE LEVEL  ETHANOL    EKG None  Radiology No results found.  Procedures Procedures (including critical care time)  Medications Ordered in ED Medications - No data to display   Initial Impression / Assessment and Plan / ED Course  I have  reviewed the triage vital signs and the nursing notes.  Pertinent labs & imaging results that were available during my care of the patient were reviewed by me and considered in my medical decision making (see chart for details).        Patient with head trauma about 2 months ago and now having hallucinations.  Question postconcussive syndrome.  Will check head CT.  Likely need neurology follow-up.  Psychiatry to evaluate patient in the morning.  Unclear whether this is psychogenic or neurologic.  2:06 AM CT head shows age indeterminate infarct.  Discussed with Dr. Rory Percy, who recommends MRI.  If positive, admit to medicine.  If negative, outpatient follow-up.  5:22 AM MRI shows remote infarcts.  Also slow or absent flow of right ICA.  Will need close outpatient follow-up.  I have requested an ambulatory referral.  Psychiatry to see patient in the AM.  Dispo pending psych.  Final Clinical Impressions(s) / ED Diagnoses   Final diagnoses:  Hallucinations  Cerebral infarction, watershed distribution, unilateral, remote, resolved    ED Discharge Orders    None       Montine Circle, PA-C 10/08/19 [id number]    Gareth Morgan, MD 10/09/19 1346

## 2019-10-07 NOTE — Telephone Encounter (Signed)
New Message  Pt is requesting a referral to a Neurologist-Dr. Ellouise Newer. Please f/u

## 2019-10-07 NOTE — ED Triage Notes (Signed)
Pt reports multiple falls due to back and leg trouble. Pt states she has spiders and worms in her eyes. Seen an eye doctor and states she needs cataract surgery.

## 2019-10-07 NOTE — Telephone Encounter (Signed)
Please contact pt and see why she is needing referral

## 2019-10-07 NOTE — BH Assessment (Signed)
Tele Assessment Note   Patient Name: Nancy [name] MRN: [id number] Referring Physician: Montine Circle, PA Location of Patient: [facility] Location of Provider: [place] is an 58 y.o. female.  Patient had called [name] requesting a referral to Dr. Ellouise Newer (neurologist).  [name], LCSW called patient back and spoke to her about what was going on.  See Nancy [name] note in Epic.  Pt says she was brought over to [name] by Education officer, museum.  Patient reports having had "a serious fall" two months ago.  She said she has had two other falls subsequent to that one.  She said that she has had eye problems since then.  Patient said that 2-3 weeks after that she started feeling like needles & pins were coming out of her eyes. She said that they are tiny and she cannot see them but she can feel them.  She says "I know it does not make sense."  She appears to be describing a tactile hallucination of objects coming out of her eyes.  She puts her fingers near her eyes and makes a "pulling" motion downward.  She does this off and on during the assessment.  She says that over the last few days it feels like worms or spiders are crawling around in her eyes and trying to get out.  Patient says she has not slept in the last two days.  She becomes tearful and said "I'm afraid to sleep because I don't know what to do."  Patient says that she has had a passing thought about suicide but no plan or intention.  No current plan or intention to commit suicide either.  Patient denies HI.  She also denies auditory and visual hallucinations.  Patient denies use of ETOH or illicit drug use.  Patient has been seen by Dr. Katy Fitch (ophthalmology) twice since fall.  She has cataracts and is unable to drive due to them.  Patient acknowledges some depression.  She has good eye contact.  She appears sad.  Her thought process is coherent and logical.  Patient does appear to be  responding to tactile hallucinations.  Patient was seen by a Education officer, museum in 2018 and was prescribed Welbutrin.  She said she did not get a refill.  Patient has no outpatient services currently and has no inpatient psychiatric hx.  -Clinician discussed patient care with Talbot Grumbling, NP who recommends observation overnight and to be seen by psychiatry in AM.  Clinician discussed with Montine Circle, PA and informed him of disposition.  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 Anemia 2006  . Depression 2014   previously on amitryptiline   . 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 Fracture of left ankle 1997   . Gastroparesis 07/2016  . HOH (hard of hearing) 2004   . Hyperlipidemia 2006  . Hypertension 2006  . IBS (irritable bowel syndrome) 2002   . Leukopenia 2015   . Shingles 2009   . Thyroid nodule 2004    Past Surgical History:  Procedure Laterality Date  . ABDOMINAL HYSTERECTOMY  2005  . CESAREAN SECTION  1983   . ESOPHAGOGASTRODUODENOSCOPY (EGD) WITH PROPOFOL Left 08/26/2014   Procedure: ESOPHAGOGASTRODUODENOSCOPY (EGD) WITH PROPOFOL;  Surgeon: Arta Silence, MD;  Location: [facility];  Service: Endoscopy;  Laterality: Left;    Family History:  Family History  Problem Relation Age of Onset  . Hypertension  Mother   . Heart disease Mother   . Diabetes Mother   . Thyroid disease Mother   . Congestive Heart Failure Mother   . Breast cancer Maternal Grandmother   . Colon cancer Maternal Grandfather   . Heart attack Sister   . Heart disease Brother   . Hyperlipidemia Brother   . Hypertension Brother   . Diabetes Father   . Breast cancer Maternal Aunt     Social History:  reports that she has quit smoking. Her smoking use included cigarettes. She has a 0.13 pack-year smoking history. She has never used smokeless tobacco. She reports that she does not drink alcohol or use  drugs.  Additional Social History:  Alcohol / Drug Use Pain Medications: None Prescriptions: See PTA med list Over the Counter: See PTA medication list. History of alcohol / drug use?: No history of alcohol / drug abuse  CIWA: CIWA-Ar BP: (!) 181/99 Pulse Rate: 95 COWS:    Allergies:  Allergies  Allergen Reactions  . Hydrocodone Other (See Comments)    Upset stomach  . Metformin And Related Nausea And Vomiting and Other (See Comments)    Stomach pains   . Plaquenil [Hydroxychloroquine Sulfate] Hives  . Sulfur Hives    Home Medications: (Not in a hospital admission)   OB/GYN Status:  No LMP recorded. Patient has had a hysterectomy.  General Assessment Data Location of Assessment: [facility] TTS Assessment: In system Is this a Tele or Face-to-Face Assessment?: Tele Assessment Is this an Initial Assessment or a Re-assessment for this encounter?: Initial Assessment Patient Accompanied by:: N/A Language Other than English: No Living Arrangements: Other (Comment)(Lives alone.) What gender do you identify as?: Female Marital status: Separated Maiden name: [name] Pregnancy Status: No Living Arrangements: Alone Can pt return to current living arrangement?: Yes Admission Status: Voluntary Is patient capable of signing voluntary admission?: Yes Referral Source: Other(CSW [name]) Insurance type: self pay     Crisis Care Plan Living Arrangements: Alone Name of Psychiatrist: None Name of Therapist: None  Education Status Is patient currently in school?: No Is the patient employed, unemployed or receiving disability?: Unemployed  Risk to self with the past 6 months Suicidal Ideation: No Has patient been a risk to self within the past 6 months prior to admission? : No Suicidal Intent: No Has patient had any suicidal intent within the past 6 months prior to admission? : No Is patient at risk for suicide?: No Suicidal Plan?: No Has patient had any suicidal plan  within the past 6 months prior to admission? : No Access to Means: No What has been your use of drugs/alcohol within the last 12 months?: None Previous Attempts/Gestures: No How many times?: 0 Other Self Harm Risks: None Triggers for Past Attempts: None known Intentional Self Injurious Behavior: None Family Suicide History: No Recent stressful life event(s): Recent negative physical changes(Had a fall two months ago.) Persecutory voices/beliefs?: No Depression: Yes Depression Symptoms: Despondent, Insomnia, Loss of interest in usual pleasures, Tearfulness, Isolating Substance abuse history and/or treatment for substance abuse?: No Suicide prevention information given to non-admitted patients: Not applicable  Risk to Others within the past 6 months Homicidal Ideation: No Does patient have any lifetime risk of violence toward others beyond the six months prior to admission? : No Thoughts of Harm to Others: No Current Homicidal Intent: No Current Homicidal Plan: No Access to Homicidal Means: No Identified Victim: No one History of harm to others?: No Assessment of Violence: None Noted Violent Behavior  Description: None reported Does patient have access to weapons?: No Criminal Charges Pending?: No Does patient have a court date: No Is patient on probation?: No  Psychosis Hallucinations: Tactile(Feeling things coming out of her eyes.) Delusions: None noted, Somatic  Mental Status Report Appearance/Hygiene: Unremarkable Eye Contact: Good Motor Activity: Freedom of movement, Unremarkable Speech: Logical/coherent Level of Consciousness: Alert Mood: Depressed, Helpless, Pleasant Affect: Appropriate to circumstance Anxiety Level: Panic Attacks Panic attack frequency: Two in the last two months. Most recent panic attack: 3 weeks ago Thought Processes: Coherent, Relevant Judgement: Unimpaired Orientation: Person, Place, Situation, Time Obsessive Compulsive Thoughts/Behaviors:  None  Cognitive Functioning Concentration: Normal Memory: Recent Impaired, Remote Intact Is patient IDD: No Insight: Good Impulse Control: Good Appetite: Fair Have you had any weight changes? : Loss Amount of the weight change? (lbs): (20 lbs but she attributes this to diabetes) Sleep: Decreased Total Hours of Sleep: (No sleep in 2 days. Frightened of what will happen.) Vegetative Symptoms: None  ADLScreening [facility] Assessment Services) Patient's cognitive ability adequate to safely complete daily activities?: Yes Patient able to express need for assistance with ADLs?: Yes Independently performs ADLs?: Yes (appropriate for developmental age)  Prior Inpatient Therapy Prior Inpatient Therapy: No  Prior Outpatient Therapy Prior Outpatient Therapy: Yes Prior Therapy Dates: 2018 Prior Therapy Facilty/Provider(s): [facility] Reason for Treatment: counseling Does patient have an ACCT team?: No Does patient have Intensive In-House Services?  : No Does patient have [facility] services? : No Does patient have P4CC services?: No  ADL Screening (condition at time of admission) Patient's cognitive ability adequate to safely complete daily activities?: Yes Is the patient deaf or have difficulty hearing?: Yes("My hearing is way low.") Does the patient have difficulty seeing, even when wearing glasses/contacts?: Yes(Feeling like things are coming out of her eyes.) Does the patient have difficulty concentrating, remembering, or making decisions?: Yes Patient able to express need for assistance with ADLs?: Yes Does the patient have difficulty dressing or bathing?: No Independently performs ADLs?: Yes (appropriate for developmental age) Does the patient have difficulty walking or climbing stairs?: Yes(uses a cane due to arthritis in back and neck.) Weakness of Legs: Both Weakness of Arms/Hands: Both  Home Assistive Devices/Equipment Home Assistive Devices/Equipment: Cane (specify quad or  straight)    Abuse/Neglect Assessment (Assessment to be complete while patient is alone) Abuse/Neglect Assessment Can Be Completed: Yes Physical Abuse: Denies Verbal Abuse: Denies Sexual Abuse: Denies Exploitation of patient/patient's resources: Denies Self-Neglect: Denies                Disposition:  Disposition Initial Assessment Completed for this Encounter: Yes Patient referred to: (Observe overnight and AM psych eval.)  This service was provided via telemedicine using a 2-way, interactive audio and video technology.  Names of all persons participating in this telemedicine service and their role in this encounter. Name: Ronae Noell Role: Patient  Name: Allen Norris QP Role: clinician  Name:  Role:   Name:  Role:     Raymondo Band 10/07/2019 11:40 PM

## 2019-10-07 NOTE — BH Specialist Note (Signed)
Integrated Behavioral Health Visit via Telemedicine (Telephone)  10/07/2019 Nancy [name] [id number]   Session Start time: 11:08 AM  Session End time: 11:23 AM Total time: 15 minutes  Referring Provider: PCP is Dr. Wynetta Emery Type of Visit: Telephonic Patient location: Home [facility] Provider location: Office All persons participating in visit: Pt and LCSW  Confirmed patient's address: No  Confirmed patient's phone number: Yes  Any changes to demographics: No   Confirmed patient's insurance: Yes  Any changes to patient's insurance: No   Discussed confidentiality: No    The following statements were read to the patient and/or legal guardian that are established with the [facility] Provider.  "The purpose of this phone visit is to provide behavioral health care while limiting exposure to the coronavirus (COVID19).  There is a possibility of technology failure and discussed alternative modes of communication if that failure occurs."  "By engaging in this telephone visit, you consent to the provision of healthcare.  Additionally, you authorize for your insurance to be billed for the services provided during this telephone visit."   Patient and/or legal guardian consented to telephone visit: Yes   PRESENTING CONCERNS: Patient and/or family reports the following symptoms/concerns: Pt shared that she has been experiencing visual hallucinations after hitting her head from a fall approximately 2 months ago.   Pt reports feeling various objects in both eyes, such as, toothpicks, baby worms, and as of last night spiders daily. Pt pulls objects out of eye with fingers for temporary relief. She has not slept in two days.  Duration of problem: 2 months; Severity of problem: severe  STRENGTHS (Protective Factors/Coping Skills): Pt receives strong support from family Pt has good insight regarding how reported symptoms negatively impact her safety  GOALS ADDRESSED: Patient will: 1.   Reduce symptoms of: visual hallucination  2.  Increase knowledge and/or ability of: self-management skills  3.  Demonstrate ability to: assess for personal safety   INTERVENTIONS: Interventions utilized:  Solution-Focused Strategies and Supportive Counseling Standardized Assessments completed: Not Needed  ASSESSMENT: Patient currently experiencing visual hallucinations resulting in patient pulling eyes on a daily basis for approximately 4-5 hours. Visual hallucinations have increased causing pt not to sleep for 2 days. Pt denies SI/HI and feels safe at current residence.    Patient may benefit from a medical evaluation. LCSW provided supportive counseling and discussed how symptoms negatively impact pt's safety. Pt agreed to go to nearest ED to be assessed. Pt's sister will transport patient to ED in the next hour. Note will be routed to pt's PCP.  PLAN: 1. Follow up with behavioral health clinician on : Schedule follow up appointment with LCSW, if needed 2. Behavioral recommendations: LCSW recommends pt go to nearest ED to complete medical evaluation to address reported symptoms 3. Referral(s): [place] (In Clinic)  Rebekah Chesterfield, L:CSW 10/07/2019

## 2019-10-08 ENCOUNTER — Emergency Department (HOSPITAL_COMMUNITY): Payer: Self-pay

## 2019-10-08 DIAGNOSIS — R443 Hallucinations, unspecified: Secondary | ICD-10-CM

## 2019-10-08 DIAGNOSIS — Z8673 Personal history of transient ischemic attack (TIA), and cerebral infarction without residual deficits: Secondary | ICD-10-CM

## 2019-10-08 LAB — CBC WITH DIFFERENTIAL/PLATELET
Abs Immature Granulocytes: 0.03 10*3/uL (ref 0.00–0.07)
Basophils Absolute: 0.1 10*3/uL (ref 0.0–0.1)
Basophils Relative: 1 %
Eosinophils Absolute: 0.4 10*3/uL (ref 0.0–0.5)
Eosinophils Relative: 3 %
HCT: 39.6 % (ref 36.0–46.0)
Hemoglobin: 12.5 g/dL (ref 12.0–15.0)
Immature Granulocytes: 0 %
Lymphocytes Relative: 37 %
Lymphs Abs: 4.5 10*3/uL — ABNORMAL HIGH (ref 0.7–4.0)
MCH: 28.3 pg (ref 26.0–34.0)
MCHC: 31.6 g/dL (ref 30.0–36.0)
MCV: 89.8 fL (ref 80.0–100.0)
Monocytes Absolute: 0.7 10*3/uL (ref 0.1–1.0)
Monocytes Relative: 6 %
Neutro Abs: 6.4 10*3/uL (ref 1.7–7.7)
Neutrophils Relative %: 53 %
Platelets: 305 10*3/uL (ref 150–400)
RBC: 4.41 MIL/uL (ref 3.87–5.11)
RDW: 12.2 % (ref 11.5–15.5)
WBC: 12.2 10*3/uL — ABNORMAL HIGH (ref 4.0–10.5)
nRBC: 0 % (ref 0.0–0.2)

## 2019-10-08 LAB — COMPREHENSIVE METABOLIC PANEL
ALT: 13 U/L (ref 0–44)
AST: 13 U/L — ABNORMAL LOW (ref 15–41)
Albumin: 3.9 g/dL (ref 3.5–5.0)
Alkaline Phosphatase: 64 U/L (ref 38–126)
Anion gap: 10 (ref 5–15)
BUN: 17 mg/dL (ref 6–20)
CO2: 24 mmol/L (ref 22–32)
Calcium: 9.5 mg/dL (ref 8.9–10.3)
Chloride: 105 mmol/L (ref 98–111)
Creatinine, Ser: 1.03 mg/dL — ABNORMAL HIGH (ref 0.44–1.00)
GFR calc Af Amer: >60 mL/min (ref >60)
GFR calc non Af Amer: 59 mL/min — ABNORMAL LOW (ref >60)
Glucose, Bld: 111 mg/dL — ABNORMAL HIGH (ref 70–99)
Potassium: 3.5 mmol/L (ref 3.5–5.1)
Sodium: 139 mmol/L (ref 135–145)
Total Bilirubin: 0.6 mg/dL (ref 0.3–1.2)
Total Protein: 7.8 g/dL (ref 6.5–8.1)

## 2019-10-08 LAB — ACETAMINOPHEN LEVEL: Acetaminophen (Tylenol), Serum: <10 ug/mL — ABNORMAL LOW (ref 10–30)

## 2019-10-08 LAB — CBG MONITORING, ED: Glucose-Capillary: 176 mg/dL — ABNORMAL HIGH (ref 70–99)

## 2019-10-08 LAB — SALICYLATE LEVEL: Salicylate Lvl: <7 mg/dL (ref 2.8–30.0)

## 2019-10-08 LAB — TSH: TSH: 1.72 u[IU]/mL (ref 0.350–4.500)

## 2019-10-08 LAB — ETHANOL: Alcohol, Ethyl (B): <10 mg/dL (ref <10)

## 2019-10-08 MED ORDER — GABAPENTIN 300 MG PO CAPS
300.0000 mg | ORAL_CAPSULE | Freq: Every day | ORAL | Status: DC
  Administered 2019-10-08: 300 mg via ORAL
  Filled 2019-10-08: qty 1

## 2019-10-08 MED ORDER — TRAZODONE HCL 50 MG PO TABS
50.0000 mg | ORAL_TABLET | Freq: Every evening | ORAL | Status: DC | PRN
  Administered 2019-10-08: 50 mg via ORAL
  Filled 2019-10-08: qty 1

## 2019-10-08 MED ORDER — EZETIMIBE 10 MG PO TABS
10.0000 mg | ORAL_TABLET | Freq: Every day | ORAL | Status: DC
  Administered 2019-10-09: 10 mg via ORAL
  Filled 2019-10-08: qty 1

## 2019-10-08 MED ORDER — DULOXETINE HCL 20 MG PO CPEP
20.0000 mg | ORAL_CAPSULE | Freq: Every day | ORAL | Status: DC
  Administered 2019-10-08: 20 mg via ORAL
  Filled 2019-10-08: qty 1

## 2019-10-08 MED ORDER — LORAZEPAM 1 MG PO TABS
1.0000 mg | ORAL_TABLET | Freq: Once | ORAL | Status: AC | PRN
  Administered 2019-10-08: 1 mg via ORAL
  Filled 2019-10-08: qty 1

## 2019-10-08 MED ORDER — PANTOPRAZOLE SODIUM 40 MG PO TBEC
40.0000 mg | DELAYED_RELEASE_TABLET | Freq: Every day | ORAL | Status: DC
  Administered 2019-10-08 – 2019-10-09 (×2): 40 mg via ORAL
  Filled 2019-10-08 (×2): qty 1

## 2019-10-08 MED ORDER — HYDROCHLOROTHIAZIDE 25 MG PO TABS
12.5000 mg | ORAL_TABLET | Freq: Every day | ORAL | Status: DC
  Administered 2019-10-08 – 2019-10-09 (×2): 12.5 mg via ORAL
  Filled 2019-10-08 (×2): qty 1

## 2019-10-08 MED ORDER — LISINOPRIL 20 MG PO TABS
40.0000 mg | ORAL_TABLET | Freq: Every day | ORAL | Status: DC
  Administered 2019-10-08 – 2019-10-09 (×2): 40 mg via ORAL
  Filled 2019-10-08 (×2): qty 2

## 2019-10-08 MED ORDER — INSULIN ASPART 100 UNIT/ML ~~LOC~~ SOLN
7.0000 [IU] | Freq: Two times a day (BID) | SUBCUTANEOUS | Status: DC
  Administered 2019-10-09 (×2): 7 [IU] via SUBCUTANEOUS

## 2019-10-08 MED ORDER — INSULIN GLARGINE 100 UNIT/ML ~~LOC~~ SOLN
65.0000 [IU] | Freq: Every day | SUBCUTANEOUS | Status: DC
  Administered 2019-10-09: 65 [IU] via SUBCUTANEOUS
  Filled 2019-10-08 (×2): qty 0.65

## 2019-10-08 NOTE — ED Notes (Signed)
CBG: 176 °

## 2019-10-08 NOTE — ED Notes (Addendum)
Home meds requested from Dr. Kathrynn Humble

## 2019-10-08 NOTE — ED Notes (Signed)
Family at bedside. 

## 2019-10-08 NOTE — Progress Notes (Signed)
Patient ID: Nancy [name], female   DOB: 23-Feb-1961, 58 y.o.   MRN: [id number]   Patient assessed by this provider following request for psychiatric consultation. Patient was admitted tot the ED following reports of tactile hallucination. During this assessment, patient is alert and oriented x4, calm and cooperative. Patient provides exact information as she did during her initial evaluation. She reports having a fall two months ago where she hit her face. She reports two other falls following that incident. Reports shortly after the fall, she begin to have visual disturbances, she was seen by seen by Dr. Katy Fitch (ophthalmology) and diagnosed with  Cataracts. Reports she too went to a retinologist and was cleared. Although she was cleared, she continues to describe tactile hallucinations," feelings of toothpicks coming out my eyes that later started to feel different like deflated balloons, cotton balls, and worms crawling."  She denies ever seeing these objects. She denies other psychosis and she has no psychiatric background besides being diagnosed with depression in 2017. She reports at times, the objects are painful and interferes with her sleep. She denies auditory hallucinations and  at no time during this assessment did she appear internally preoccupied. She does however, pick at her eyes.  She again denied changes in medication. She denies SI or HI. She denies use of ETOH or illicit drug use.  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  Prior to making a disposition,Dr. Dwyane Dee has requested a neuro consult and for [facility] to be notified  following consult and neuro's recommendations.

## 2019-10-08 NOTE — ED Provider Notes (Signed)
  Physical Exam  BP (!) 181/99 (BP Location: Right Arm)   Pulse 95   Temp 99 F (37.2 C) (Oral)   Resp 16   SpO2 100%   Physical Exam  ED Course/Procedures     Procedures  MDM  Psychiatry request neuro consult.       Davonna Belling, MD 10/08/19 [phone]

## 2019-10-08 NOTE — ED Notes (Signed)
Per [facility] Pt List, pt will be reassessed in AM. Pt remains calm, cooperative. Denies SI/HI.

## 2019-10-08 NOTE — ED Notes (Signed)
[facility] notified of neuro consult and recommendations for psych eval to continue. The patient is still pulling at her eyes and states she knows the "toothpicks" are not really there.

## 2019-10-08 NOTE — Consult Note (Addendum)
Neurology Consultation  Reason for Consult: Feeling as though things such as toothpicks and worms are coming out of her eyes  Referring Physician: Dr. Alvino Chapel  History is obtained from: Patient and chart  HPI: Nancy [name] is a 58 y.o. female with thyroid nodules, irritable bowel syndrome, hyperlipidemia, hypertension, depression, anemia, diabetes, Graves' disease.  Patient states that approximately 2 months ago she had a serious fall in which she hit the back of her head.  Approximately 2 weeks ago she started feeling as though she was having eye pain, and felt as though there were needles and toothpicks that were poking her behind her eyes.  It got to the point where she felt as though she could pick them out of her eyes but noted there was nothing there when she tried to look at what she felt she had pulled out. She states that she knows the needles and toothpicks are not real, but does not attempt to reconcile this with the fact that she has exhibited movements as though actively/consciously picking something out of her eyes.   Over the last few days she has noticed that she is also seeing worms and/or spiders crawling around in her eyes.  Patient did go to an ophthalmologist twice since her fall who did not note any abnormalities other than cataracts.  Patient admits that she is somewhat depressed but has not had any significant abnormalities or stress in her life recently.  She has never had these symptoms before.  Other than the symptoms described above she has no other symptomatic neurological abnormalities.  Initially while standing outside the door and watching patient she was doing nothing that was abnormal.  However once I started to consult she quickly started to state that she felt the toothpicks coming out of her eyes and continued throughout the conversation to act as though she was picking something out of both eyes.  When asked if she realizes there are no toothpicks in her eyes  she states "yes I do".   ROS: A 14 point ROS was performed and is negative except as noted in the HPI.   Past Medical History:  Diagnosis Date  . Anemia 2006  . Depression 2014   previously on amitryptiline   . 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 Fracture of left ankle 1997   . Gastroparesis 07/2016  . HOH (hard of hearing) 2004   . Hyperlipidemia 2006  . Hypertension 2006  . IBS (irritable bowel syndrome) 2002   . Leukopenia 2015   . Shingles 2009   . Thyroid nodule 2004    Family History  Problem Relation Age of Onset  . Hypertension Mother   . Heart disease Mother   . Diabetes Mother   . Thyroid disease Mother   . Congestive Heart Failure Mother   . Breast cancer Maternal Grandmother   . Colon cancer Maternal Grandfather   . Heart attack Sister   . Heart disease Brother   . Hyperlipidemia Brother   . Hypertension Brother   . Diabetes Father   . Breast cancer Maternal Aunt    Social History:   reports that she has quit smoking. Her smoking use included cigarettes. She has a 0.13 pack-year smoking history. She has never used smokeless tobacco. She reports that she does not drink alcohol or use drugs.  Medications No current facility-administered medications for this encounter.   Current Outpatient Medications:  .  acetaminophen (TYLENOL) 500 MG tablet, Take 2 tablets (1,000  mg total) by mouth every 8 (eight) hours as needed for mild pain or moderate pain., Disp: 30 tablet, Rfl: 2 .  cyclobenzaprine (FLEXERIL) 5 MG tablet, TAKE 1 TABLET (5 MG TOTAL) BY MOUTH 2 (TWO) TIMES DAILY AS NEEDED FOR MUSCLE SPASMS. MUST MAKE APPT FOR FURTHER REFILLS, Disp: 60 tablet, Rfl: 0 .  diclofenac sodium (VOLTAREN) 1 % GEL, Apply 4 g topically 4 (four) times daily. (Patient not taking: Reported on 10/07/2018), Disp: 100 g, Rfl: 3 .  DULoxetine (CYMBALTA) 20 MG capsule, Take 1 capsule (20 mg total) by mouth daily., Disp: 30 capsule, Rfl: 2 .  ezetimibe (ZETIA)  10 MG tablet, Take 1 tablet (10 mg total) by mouth daily., Disp: 90 tablet, Rfl: 3 .  gabapentin (NEURONTIN) 300 MG capsule, Take 1 capsule (300 mg total) by mouth at bedtime., Disp: 30 capsule, Rfl: 2 .  hydrochlorothiazide (HYDRODIURIL) 12.5 MG tablet, Take 1 tablet (12.5 mg total) by mouth daily., Disp: 90 tablet, Rfl: 3 .  insulin aspart (NOVOLOG FLEXPEN) 100 UNIT/ML FlexPen, 7 units subcut with breakfast and dinner, Disp: 15 mL, Rfl: 11 .  Insulin Glargine (LANTUS SOLOSTAR) 100 UNIT/ML Solostar Pen, Inject 65 Units into the skin at bedtime., Disp: 15 mL, Rfl: 0 .  Insulin Pen Needle (PEN NEEDLES) 32G X 4 MM MISC, 1 each by Does not apply route 4 (four) times daily., Disp: 120 each, Rfl: 11 .  lisinopril (ZESTRIL) 40 MG tablet, Take 1 tablet (40 mg total) by mouth daily., Disp: 30 tablet, Rfl: 2 .  omeprazole (PRILOSEC) 20 MG capsule, TAKE 1 CAPSULE (20 MG TOTAL) BY MOUTH DAILY., Disp: 30 capsule, Rfl: 0 .  traZODone (DESYREL) 50 MG tablet, TAKE 1/2-1 TABLETS (25-50 MG TOTAL) BY MOUTH AT BEDTIME AS NEEDED FOR SLEEP. MUST MAKE APPT FOR FURTHER REFILLS, Disp: 30 tablet, Rfl: 2   Exam: Current vital signs: BP (!) 181/99 (BP Location: Right Arm)   Pulse 95   Temp 99 F (37.2 C) (Oral)   Resp 16   Ht 5\' 3"  (1.6 m)   Wt 91.4 kg   SpO2 100%   BMI 35.69 kg/m  Vital signs in last 24 hours: Temp:  [99 F (37.2 C)] 99 F (37.2 C) (10/21 1413) Pulse Rate:  [95-105] 95 (10/21 1747) Resp:  [16-18] 16 (10/21 1747) BP: (165-181)/(92-99) 181/99 (10/21 1747) SpO2:  [100 %] 100 % (10/21 1747) Weight:  [91.4 kg] 91.4 kg (10/22 1101)  Physical Exam  General ROS: negative for - chills, fatigue, fever, night sweats, weight gain or weight loss Psychological ROS:  positive tive for - behavioral disorder-depression, suicidal ideations Ophthalmic ROS: Positive for -feels as though she is pulling toothpicks and other objects out of her eyes ENT ROS: negative for - epistaxis, nasal discharge, oral  lesions, sore throat, tinnitus or vertigo Respiratory ROS: negative for - cough, hemoptysis, shortness of breath or wheezing Cardiovascular ROS: negative for - chest pain, dyspnea on exertion, edema or irregular heartbeat Gastrointestinal ROS: negative for - abdominal pain, diarrhea, hematemesis, nausea/vomiting or stool incontinence Musculoskeletal ROS: negative for - joint swelling or muscular weakness Neurological ROS: as noted in HPI Dermatological ROS: negative for rash and skin lesion changes    Neuro: Mental Status: Patient is awake, alert, oriented to person, place, month, year Patient is able to give a clear and coherent history. No signs of aphasia or neglect At this time patient appears delusional and feels as though she is picking toothpicks out of her eyes. When asked to clarify  if they are real, she states that there are no toothpicks Cranial Nerves: II: Visual Fields are full.  III,IV, VI: EOMI without ptosis or diplopia. Pupils equal, round and reactive to light V: Facial sensation is symmetric to temperature VII: Facial movement is symmetric.  VIII: hearing is intact to voice Motor: Tone is normal. Bulk is normal. 5/5 strength was present in all four extremities.  Sensory: Sensation is symmetric to light touch and temperature in the arms and legs. Deep Tendon Reflexes: 2+ and symmetric in the biceps and 1+ patellae.  Plantars: Toes are downgoing bilaterally.  Cerebellar: FNF and HKS are intact bilaterally  Labs I have reviewed labs in epic and the results pertinent to this consultation are:  CBC    Component Value Date/Time   WBC 12.2 (H) 10/08/2019 0113   RBC 4.41 10/08/2019 0113   HGB 12.5 10/08/2019 0113   HGB 12.7 08/27/2019 1707   HGB 12.5 07/08/2008 1551   HCT 39.6 10/08/2019 0113   HCT 39.2 08/27/2019 1707   HCT 37.3 07/08/2008 1551   PLT 305 10/08/2019 0113   PLT 276 08/27/2019 1707   MCV 89.8 10/08/2019 0113   MCV 87 08/27/2019 1707   MCV  84.7 07/08/2008 1551   MCH 28.3 10/08/2019 0113   MCHC 31.6 10/08/2019 0113   RDW 12.2 10/08/2019 0113   RDW 11.9 08/27/2019 1707   RDW 13.0 07/08/2008 1551   LYMPHSABS 4.5 (H) 10/08/2019 0113   LYMPHSABS 3.9 (H) 08/27/2018 1131   LYMPHSABS 4.6 (H) 07/08/2008 1551   MONOABS 0.7 10/08/2019 0113   MONOABS 0.6 07/08/2008 1551   EOSABS 0.4 10/08/2019 0113   EOSABS 0.8 (H) 08/27/2018 1131   BASOSABS 0.1 10/08/2019 0113   BASOSABS 0.1 08/27/2018 1131   BASOSABS 0.1 07/08/2008 1551    CMP     Component Value Date/Time   NA 139 10/08/2019 0113   NA 135 08/27/2019 1707   K 3.5 10/08/2019 0113   CL 105 10/08/2019 0113   CO2 24 10/08/2019 0113   GLUCOSE 111 (H) 10/08/2019 0113   BUN 17 10/08/2019 0113   BUN 22 08/27/2019 1707   CREATININE 1.03 (H) 10/08/2019 0113   CREATININE 0.73 08/17/2014 1641   CALCIUM 9.5 10/08/2019 0113   PROT 7.8 10/08/2019 0113   PROT 7.5 08/27/2019 1707   ALBUMIN 3.9 10/08/2019 0113   ALBUMIN 4.4 08/27/2019 1707   AST 13 (L) 10/08/2019 0113   ALT 13 10/08/2019 0113   ALKPHOS 64 10/08/2019 0113   BILITOT 0.6 10/08/2019 0113   BILITOT 0.2 08/27/2019 1707   GFRNONAA 59 (L) 10/08/2019 0113   GFRAA >60 10/08/2019 0113    Lipid Panel     Component Value Date/Time   CHOL 236 (H) 08/27/2019 1707   TRIG 180 (H) 08/27/2019 1707   HDL 52 08/27/2019 1707   CHOLHDL 4.5 (H) 08/27/2019 1707   CHOLHDL 4.9 07/24/2016 0028   VLDL 23 07/24/2016 0028   LDLCALC 151 (H) 08/27/2019 1707     Imaging I have reviewed the images obtained:  CT-scan of the brain-age-indeterminate infarct of the right posterior parieto-occipital lobe which is new from prior CT in 2018  MRI examination of the brain-slow or absent flow in the right cervical ICA with 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  There has also been a small remote left occipital cortical infarct.  Etta Quill PA-C [facility] [phone] 10/08/2019, 11:59 AM     Assessment: 58 year old female presenting to the hospital due to  having tactile hallucinations of toothpicks and pins poking the backs of her eyeballs, along with formed visual hallucinations of spiders and worms.  Patient appears to have good insight regarding the fact that these hallucinations are not real.  She is describing  3 forms of tactile hallucinations.  The first being the fact that she feels the toothpicks and objects behind her eye, the second that she feels the sensation of the object coming out or being pulled out of her eye and the third being she feels the toothpicks and objects in her fingers.   1. Exam does not show any lateralized findings as well as no tremor, asterixis or delirium to suggest a toxic/metabolic or infectious etiology.   2. 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 3. Overall impression is that her presentation is psychiatric and not neurologic.   Recommendations: -Psychiatry evaluations to continue with possible admission to behavioral health --Neurology service will sign off. Please call if there are additional questions.   Electronically signed: Dr. Kerney Elbe

## 2019-10-08 NOTE — ED Notes (Signed)
Neuro MD/PA in w/pt.

## 2019-10-08 NOTE — ED Notes (Signed)
[facility] requests neuro consult and to be notified of neuro's recommendations. Dr. Alvino Chapel notified.

## 2019-10-09 ENCOUNTER — Emergency Department (HOSPITAL_COMMUNITY): Payer: Self-pay

## 2019-10-09 LAB — SARS CORONAVIRUS 2 BY RT PCR (HOSPITAL ORDER, PERFORMED IN ~~LOC~~ HOSPITAL LAB): SARS Coronavirus 2: NEGATIVE

## 2019-10-09 LAB — CBG MONITORING, ED
Glucose-Capillary: 119 mg/dL — ABNORMAL HIGH (ref 70–99)
Glucose-Capillary: 160 mg/dL — ABNORMAL HIGH (ref 70–99)

## 2019-10-09 MED ORDER — QUETIAPINE FUMARATE 25 MG PO TABS
25.0000 mg | ORAL_TABLET | Freq: Two times a day (BID) | ORAL | Status: DC
  Administered 2019-10-09: 25 mg via ORAL
  Filled 2019-10-09: qty 1

## 2019-10-09 NOTE — ED Notes (Signed)
Diet was ordered for Lunch. 

## 2019-10-09 NOTE — Progress Notes (Signed)
Pt accepted to [facility]  Dr. Raford Pitcher is the accepting/attending provider.   Call report to [phone] [place] [facility] notified.    Pt is voluntary and will be transported by [employer]  Pt is scheduled to arrive at [facility] as soon as transportation can be arranged.   Audree Camel, LCSW, [place] Disposition [place] [facility]TT[facility] [phone]

## 2019-10-09 NOTE — Progress Notes (Signed)
Pt meets inpatient criteria. Referral information has been sent to the following hospitals for review:  [facility]  Disposition will continue to follow.   Audree Camel, LCSW, [place] Disposition [facility] [phone]

## 2019-10-09 NOTE — ED Notes (Signed)
Patient CBG was 119.

## 2019-10-26 ENCOUNTER — Other Ambulatory Visit: Payer: Self-pay | Admitting: Internal Medicine

## 2019-10-26 ENCOUNTER — Telehealth: Payer: Self-pay | Admitting: Licensed Clinical Social Worker

## 2019-10-26 DIAGNOSIS — IMO0002 Reserved for concepts with insufficient information to code with codable children: Secondary | ICD-10-CM

## 2019-10-26 DIAGNOSIS — E1140 Type 2 diabetes mellitus with diabetic neuropathy, unspecified: Secondary | ICD-10-CM

## 2019-10-26 MED FILL — !NOVOLOG FLEXPEN SYRINGE 1: 100/ML | 21 days supply | Qty: 3 | Fill #1

## 2019-10-26 MED FILL — traZODone HCL 50 MG TABS: 50 | 30 days supply | Qty: 30 | Fill #0

## 2019-10-26 MED FILL — DULoxetine HCL 20 MG CPEP: 20 | 30 days supply | Qty: 30 | Fill #1

## 2019-10-26 MED FILL — LISINOPRIL 40 MG TABLET: 40 | 30 days supply | Qty: 30 | Fill #1

## 2019-10-26 MED FILL — GABAPENTIN 300 MG CAPSULE: 300 | 30 days supply | Qty: 30 | Fill #1

## 2019-10-26 MED FILL — EZETIMIBE 10 MG TAB: 10 | 30 days supply | Qty: 30 | Fill #1

## 2019-10-26 NOTE — Telephone Encounter (Signed)
LCSW received a missed call from patient requesting assistance with paper prescription that was mis-placed. LCSW placed return call and requested a call back.

## 2019-10-27 MED FILL — $LANTUS SOLOSTAR 100 UNITS/: 100 | 32 days supply | Qty: 21 | Fill #0

## 2019-10-28 MED FILL — risperiDONE 2 MG TABS: 2 | 30 days supply | Qty: 30 | Fill #0

## 2019-11-02 ENCOUNTER — Telehealth: Payer: Self-pay | Admitting: Licensed Clinical Social Worker

## 2019-11-02 NOTE — Telephone Encounter (Signed)
Call placed to patient and LCSW left message for a return call.

## 2019-11-05 ENCOUNTER — Other Ambulatory Visit: Payer: Self-pay

## 2019-11-05 ENCOUNTER — Ambulatory Visit (HOSPITAL_BASED_OUTPATIENT_CLINIC_OR_DEPARTMENT_OTHER): Payer: Self-pay | Admitting: Licensed Clinical Social Worker

## 2019-11-05 ENCOUNTER — Encounter: Payer: Self-pay | Admitting: Internal Medicine

## 2019-11-05 ENCOUNTER — Ambulatory Visit (HOSPITAL_BASED_OUTPATIENT_CLINIC_OR_DEPARTMENT_OTHER): Payer: Self-pay | Admitting: Pharmacist

## 2019-11-05 ENCOUNTER — Ambulatory Visit: Payer: Self-pay | Attending: Internal Medicine | Admitting: Internal Medicine

## 2019-11-05 VITALS — BP 165/100 | HR 93 | Temp 98.7°F | Resp 16 | Wt 207.4 lb

## 2019-11-05 DIAGNOSIS — IMO0002 Reserved for concepts with insufficient information to code with codable children: Secondary | ICD-10-CM

## 2019-11-05 DIAGNOSIS — I10 Essential (primary) hypertension: Secondary | ICD-10-CM

## 2019-11-05 DIAGNOSIS — E1169 Type 2 diabetes mellitus with other specified complication: Secondary | ICD-10-CM

## 2019-11-05 DIAGNOSIS — I639 Cerebral infarction, unspecified: Secondary | ICD-10-CM

## 2019-11-05 DIAGNOSIS — F419 Anxiety disorder, unspecified: Secondary | ICD-10-CM

## 2019-11-05 DIAGNOSIS — Z794 Long term (current) use of insulin: Secondary | ICD-10-CM

## 2019-11-05 DIAGNOSIS — D490 Neoplasm of unspecified behavior of digestive system: Secondary | ICD-10-CM

## 2019-11-05 DIAGNOSIS — Z23 Encounter for immunization: Secondary | ICD-10-CM

## 2019-11-05 DIAGNOSIS — F329 Major depressive disorder, single episode, unspecified: Secondary | ICD-10-CM

## 2019-11-05 DIAGNOSIS — E1140 Type 2 diabetes mellitus with diabetic neuropathy, unspecified: Secondary | ICD-10-CM

## 2019-11-05 DIAGNOSIS — R442 Other hallucinations: Secondary | ICD-10-CM

## 2019-11-05 DIAGNOSIS — F32A Depression, unspecified: Secondary | ICD-10-CM

## 2019-11-05 DIAGNOSIS — E785 Hyperlipidemia, unspecified: Secondary | ICD-10-CM

## 2019-11-05 DIAGNOSIS — I6521 Occlusion and stenosis of right carotid artery: Secondary | ICD-10-CM

## 2019-11-05 DIAGNOSIS — E1165 Type 2 diabetes mellitus with hyperglycemia: Secondary | ICD-10-CM

## 2019-11-05 LAB — GLUCOSE, POCT (MANUAL RESULT ENTRY): POC Glucose: 184 mg/dl — AB (ref 70–99)

## 2019-11-05 MED ORDER — ATORVASTATIN CALCIUM 10 MG PO TABS: 10.0000 mg | ORAL_TABLET | Freq: Every day | ORAL | 3 refills | Status: DC

## 2019-11-05 MED ORDER — ASPIRIN EC 81 MG PO TBEC: 81.0000 mg | DELAYED_RELEASE_TABLET | Freq: Every day | ORAL | 1 refills | Status: DC

## 2019-11-05 NOTE — Progress Notes (Signed)
Patient presents for vaccination against influenza per orders of Dr. Johnson. Consent given. Counseling provided. No contraindications exists. Vaccine administered without incident.   

## 2019-11-05 NOTE — Progress Notes (Signed)
Patient ID: Nancy [name], female    DOB: 01-14-1961  MRN: [id number]  CC: Diabetes and Hypertension   Subjective: Nancy [name] is a 58 y.o. female who presents for hosp f/u and chronic ds management.  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  CT head revealed age indeterminate infarct of RT posterior parieto-occipital lobe new from 2018.  MRI brain revealed small remote infarcts in RT cerebral hemisphere and small remote LT occipital cortex infarct; RT cervical ICA with slow or absent flow,3 cm mass/neoplasm RT parotid that is stable in size since 2018.  Seen by neurology in ER and presentation at the time assess to be psychiatric not neurology. Pt sent to [facility] for psychiatric admission where she remained for 10 days.  Dischg on Risperidone  2 mg and pt told to est care with [facility] for ongoing psychiatric care.  Today she reports doing well on the med.  No further hallucinations -not had any falls since last visit   DM:  On last visit, we d/c Amaryl and started Novolog 7 units with BF and dinner instead.  Lantus was increased to 65 units.  BS were low while in hospital so Lantus dec to 50 units and she takes Novolog once a day.  Lancet device no longer works so she has been unable to check BS.  Drinks apple and prune juice to help decrease constipation.  Miralax makes her gastroparesis symptoms worse.   -did not get assistance from [facility] to get cataract surgery  HTN:  On last visit, HCTZ was added to Lisinopril but she has not been taking.  RT parotid mass: was suppose to apply for [facility] discount/OC since last 04/2018 so that we could refer to ENT but has not done so  She is applying for disability and needs help in  applying for Medicaid.  Patient Active Problem List   Diagnosis Date Noted  . Cortical age-related cataract of both eyes 08/30/2019  . Gait abnormality 08/30/2019  . Statin declined 08/30/2019  . Gastroesophageal reflux disease without esophagitis 04/18/2018  . 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 Uncontrolled type 2 diabetes mellitus with diabetic neuropathy, with long-term current use of insulin ([place]) 10/29/2017  . History of macular degeneration 10/29/2017  . Chronic cervical pain 10/29/2016  . Myalgia 09/14/2016  . Insomnia 09/14/2016  . Tinea pedis of both feet 08/20/2016  . Macular degeneration 08/17/2016  . Low back pain 09/21/2014  . Hypertension 09/21/2014  . Diabetic gastroparesis associated with type 2 diabetes mellitus ([place]) 09/14/2014  . Thyroid nodule 08/17/2014  . Obesity (BMI 30-39.9) 08/17/2014  . Hyperlipidemia 08/05/2014  . Type II diabetes mellitus with neurological manifestations, uncontrolled ([place]) 08/04/2014  . Elevated serum protein level 08/04/2014     Current Outpatient Medications on File Prior to Visit  Medication Sig Dispense Refill  . acetaminophen (TYLENOL) 500 MG tablet Take 2 tablets (1,000 mg total) by mouth every 8 (eight) hours as needed for mild pain or moderate pain. 30 tablet 2  . cyclobenzaprine (FLEXERIL) 5 MG tablet TAKE 1 TABLET (5 MG TOTAL) BY MOUTH 2 (TWO) TIMES DAILY AS NEEDED FOR MUSCLE SPASMS. MUST MAKE APPT FOR FURTHER REFILLS 60 tablet 0  . diclofenac sodium (VOLTAREN) 1 % GEL Apply 4 g topically 4 (four) times daily. 100  g 3  . DULoxetine (CYMBALTA) 20 MG capsule Take 1 capsule (20 mg total) by mouth daily. 30 capsule 2  . ezetimibe (ZETIA) 10 MG tablet Take 1 tablet (10 mg total) by mouth daily. 90 tablet 3  . gabapentin (NEURONTIN) 300 MG capsule Take 1 capsule (300 mg total) by mouth at bedtime. 30 capsule 2  . hydrochlorothiazide (HYDRODIURIL) 12.5 MG tablet Take 1 tablet (12.5 mg total) by mouth daily. 90 tablet 3  . insulin aspart  (NOVOLOG FLEXPEN) 100 UNIT/ML FlexPen 7 units subcut with breakfast and dinner (Patient taking differently: Inject 7 Units into the skin 2 (two) times daily. 7 units subcut with breakfast and dinner) 15 mL 11  . Insulin Pen Needle (PEN NEEDLES) 32G X 4 MM MISC 1 each by Does not apply route 4 (four) times daily. 120 each 11  . LANTUS SOLOSTAR 100 UNIT/ML Solostar Pen INJECT 65 UNITS INTO THE SKIN AT BEDTIME. 15 mL 2  . lisinopril (ZESTRIL) 40 MG tablet Take 1 tablet (40 mg total) by mouth daily. 30 tablet 2  . omeprazole (PRILOSEC) 20 MG capsule TAKE 1 CAPSULE (20 MG TOTAL) BY MOUTH DAILY. 30 capsule 0  . traZODone (DESYREL) 50 MG tablet TAKE 1/2-1 TABLETS (25-50 MG TOTAL) BY MOUTH AT BEDTIME AS NEEDED FOR SLEEP. MUST MAKE APPT FOR FURTHER REFILLS (Patient not taking: Reported on 10/08/2019) 30 tablet 2  . [DISCONTINUED] Insulin NPH Isophane & Regular (RELION 70/30 [place]) Inject 35 Units into the skin 2 (two) times daily.     No current facility-administered medications on file prior to visit.     Allergies  Allergen Reactions  . Hydrocodone Other (See Comments)    Upset stomach  . Metformin And Related Nausea And Vomiting and Other (See Comments)    Stomach pains   . Plaquenil [Hydroxychloroquine Sulfate] Hives  . Sulfur Hives    Social History   Socioeconomic History  . Marital status: Legally Separated    Spouse name: [name]  . Number of children: 1  . Years of education: some colle  . Highest education level: Not on file  Occupational History  . Occupation: Employed FT as Landscape architect    Comment: [employer]   Social Needs  . Financial resource strain: Not on file  . Food insecurity    Worry: Not on file    Inability: Not on file  . Transportation needs    Medical: Not on file    Non-medical: Not on file  Tobacco Use  . Smoking status: Former Smoker    Packs/day: 0.25    Years: 0.50    Pack years: 0.12    Types: Cigarettes  . Smokeless tobacco: Never Used  Substance and  Sexual Activity  . Alcohol use: No    Frequency: Never    Comment: occasion   . Drug use: No  . Sexual activity: Yes    Birth control/protection: None  Lifestyle  . Physical activity    Days per week: Not on file    Minutes per session: Not on file  . Stress: Not on file  Relationships  . Social Herbalist on phone: Not on file    Gets together: Not on file    Attends religious service: Not on file    Active member of club or organization: Not on file    Attends meetings of clubs or organizations: Not on file    Relationship status: Not on file  . Intimate partner violence  Fear of current or ex partner: Not on file    Emotionally abused: Not on file    Physically abused: Not on file    Forced sexual activity: Not on file  Other Topics Concern  . Not on file  Social History Narrative   Married.  Lives with husband and daughter.  Works full time in Engineer, structural (high stress).   Caring for mother who's health is failing, starting dialysis.     Family History  Problem Relation Age of Onset  . Hypertension Mother   . Heart disease Mother   . Diabetes Mother   . Thyroid disease Mother   . Congestive Heart Failure Mother   . Breast cancer Maternal Grandmother   . Colon cancer Maternal Grandfather   . Heart attack Sister   . Heart disease Brother   . Hyperlipidemia Brother   . Hypertension Brother   . Diabetes Father   . Breast cancer Maternal Aunt     Past Surgical History:  Procedure Laterality Date  . ABDOMINAL HYSTERECTOMY  2005  . CESAREAN SECTION  1983   . ESOPHAGOGASTRODUODENOSCOPY (EGD) WITH PROPOFOL Left 08/26/2014   Procedure: ESOPHAGOGASTRODUODENOSCOPY (EGD) WITH PROPOFOL;  Surgeon: Arta Silence, MD;  Location: [facility] ENDOSCOPY;  Service: Endoscopy;  Laterality: Left;    ROS: Review of Systems Negative except as stated above  PHYSICAL EXAM: BP (!) 165/100   Pulse 93   Temp 98.7 F (37.1 C) (Oral)   Resp 16   Wt 207 lb 6.4  oz (94.1 kg)   SpO2 99%   BMI 36.74 kg/m   Wt Readings from Last 3 Encounters:  11/05/19 207 lb 6.4 oz (94.1 kg)  10/08/19 201 lb 8 oz (91.4 kg)  08/27/19 201 lb 6.4 oz (91.4 kg)   Repeat BP 141/79 Physical Exam  General appearance - alert, well appearing, and in no distress Mental status - normal mood, behavior, speech, dress, motor activity, and thought processes Mouth - mucous membranes moist, pharynx normal without lesions Neck - no cervical LN. Firm movable RT sided parotid mass felt.  + LT side carotid bruit Chest - clear to auscultation, no wheezes, rales or rhonchi, symmetric air entry Heart - normal rate, regular rhythm, normal S1, S2, no murmurs, rubs, clicks or gallops Neurological - ambulates with cane. Except for dec vision, CN grossly intact.  Power 4+/5 UEs and LE ext Extremities -no LE edema   Depression screen [facility] 2/9 08/27/2018 04/18/2018 03/06/2018  Decreased Interest 1 1 1   Down, Depressed, Hopeless 1 2 2   PHQ - 2 Score 2 3 3   Altered sleeping 0 1 3  Tired, decreased energy 3 1 1   Change in appetite 1 1 2   Feeling bad or failure about yourself  2 2 3   Trouble concentrating 1 1 2   Moving slowly or fidgety/restless 0 0 1  Suicidal thoughts 0 0 0  PHQ-9 Score 9 9 15   Some recent data might be hidden     CMP Latest Ref Rng & Units 10/08/2019 08/27/2019 10/06/2018  Glucose 70 - 99 mg/dL 111(H) 470(H) 431(H)  BUN 6 - 20 mg/dL 17 22 14   Creatinine 0.44 - 1.00 mg/dL 1.03(H) 1.18(H) 1.47(H)  Sodium 135 - 145 mmol/L 139 135 131(L)  Potassium 3.5 - 5.1 mmol/L 3.5 4.9 4.8  Chloride 98 - 111 mmol/L 105 95(L) 96(L)  CO2 22 - 32 mmol/L 24 27 25   Calcium 8.9 - 10.3 mg/dL 9.5 9.9 9.4  Total Protein 6.5 - 8.1 g/dL 7.8  7.5 -  Total Bilirubin 0.3 - 1.2 mg/dL 0.6 0.2 -  Alkaline Phos 38 - 126 U/L 64 83 -  AST 15 - 41 U/L 13(L) 13 -  ALT 0 - 44 U/L 13 15 -   Lipid Panel     Component Value Date/Time   CHOL 236 (H) 08/27/2019 1707   TRIG 180 (H) 08/27/2019 1707   HDL  52 08/27/2019 1707   CHOLHDL 4.5 (H) 08/27/2019 1707   CHOLHDL 4.9 07/24/2016 0028   VLDL 23 07/24/2016 0028   LDLCALC 151 (H) 08/27/2019 1707    CBC    Component Value Date/Time   WBC 12.2 (H) 10/08/2019 0113   RBC 4.41 10/08/2019 0113   HGB 12.5 10/08/2019 0113   HGB 12.7 08/27/2019 1707   HGB 12.5 07/08/2008 1551   HCT 39.6 10/08/2019 0113   HCT 39.2 08/27/2019 1707   HCT 37.3 07/08/2008 1551   PLT 305 10/08/2019 0113   PLT 276 08/27/2019 1707   MCV 89.8 10/08/2019 0113   MCV 87 08/27/2019 1707   MCV 84.7 07/08/2008 1551   MCH 28.3 10/08/2019 0113   MCHC 31.6 10/08/2019 0113   RDW 12.2 10/08/2019 0113   RDW 11.9 08/27/2019 1707   RDW 13.0 07/08/2008 1551   LYMPHSABS 4.5 (H) 10/08/2019 0113   LYMPHSABS 3.9 (H) 08/27/2018 1131   LYMPHSABS 4.6 (H) 07/08/2008 1551   MONOABS 0.7 10/08/2019 0113   MONOABS 0.6 07/08/2008 1551   EOSABS 0.4 10/08/2019 0113   EOSABS 0.8 (H) 08/27/2018 1131   BASOSABS 0.1 10/08/2019 0113   BASOSABS 0.1 08/27/2018 1131   BASOSABS 0.1 07/08/2008 1551   Results for orders placed or performed in visit on 11/05/19  Glucose (CBG)  Result Value Ref Range   POC Glucose 184 (A) 70 - 99 mg/dl    ASSESSMENT AND PLAN: 1. Uncontrolled type 2 diabetes mellitus with diabetic neuropathy, with long-term current use of insulin ([place]) Rxn sent to pharmacy for new lancet device so that she can check BS. Continue Lantus 50 units which she states she was on while in hosp but restart Novolog 7 units with BF and dinner.  Advise to stop consuming sugar drinks including apple and prune juice.  Will prescribe [place] to use PRN - Glucose (CBG) - Microalbumin/Creatinine Ratio, Urine  2. Essential hypertension Not at goal.  Cont Lisinopril and start HCTZ as intended on last visit  3. Tactile hallucinations Resolved on Rispirdone Seen by LCSW today Advise to f/u with [facility]   4. Cerebrovascular accident (CVA), unspecified mechanism ([place]) 5. Stenosis of right  carotid artery Discussed importance of secondary prevention -start ASA. Pt had been resistant to being on statin in the past.  However, now known CVA, I strongly recommend starting med.  She is agreeable to trail.  Instead of high dose which is usually indicated, I have decided to start at low dose then increase if she tolerates -complete work up with Echo and carotid US Refer to neurology once she has Medicaid or [facility] discount - aspirin EC 81 MG tablet; Take 1 tablet (81 mg total) by mouth daily.  Dispense: 100 tablet; Refill: 1 - atorvastatin (LIPITOR) 10 MG tablet; Take 1 tablet (10 mg total) by mouth daily.  Dispense: 30 tablet; Refill: 3 - ECHOCARDIOGRAM COMPLETE; Future - US Carotid Duplex Bilateral; Future  6. Hyperlipidemia associated with type 2 diabetes mellitus ([place]) See discussion above - atorvastatin (LIPITOR) 10 MG tablet; Take 1 tablet (10 mg total) by mouth daily.  Dispense: 30 tablet; Refill: 3  7. Parotid tumor Needs to see ENT. Pt tells me she has an appt with ENT specialist on Monday.  Her daughter has agreed to pay for the visit   Message sent to Case Worker to see what assistance she can give pt in applying for Medicaid Patient was given the opportunity to ask questions.  Patient verbalized understanding of the plan and was able to repeat key elements of the plan.   Orders Placed This Encounter  Procedures  . Microalbumin/Creatinine Ratio, Urine  . Glucose (CBG)     Requested Prescriptions    No prescriptions requested or ordered in this encounter    No follow-ups on file.  [name], MD, FACP

## 2019-11-05 NOTE — Patient Instructions (Signed)
Stop Zetia.  Start Atorvastatin instead.   Restart Hydrochlorothiazide.   Ischemic Stroke  An ischemic stroke (cerebrovascular accident, or CVA) is the sudden death of brain tissue that occurs when an area of the brain does not get enough oxygen. It is a medical emergency that must be treated right away. An ischemic stroke can cause permanent loss of brain function. This can cause problems with how different parts of your body function. What are the causes? This condition is caused by a decrease of oxygen supply to an area of the brain, which may be the result of:  A small blood clot (embolus) or a buildup of plaque in the blood vessels (atherosclerosis) that blocks blood flow in the brain.  An abnormal heart rhythm (atrial fibrillation).  A blocked or damaged artery in the head or neck. Sometimes the cause of stroke is not known (cryptogenic). What increases the risk? Certain factors may make you more likely to develop this condition. Some of these factors are things that you can change, such as:  Obesity.  Smoking cigarettes.  Taking oral birth control, especially if you also use tobacco.  Physical inactivity.  Excessive alcohol use.  Use of illegal drugs, especially cocaine and methamphetamine. Other risk factors include:  High blood pressure (hypertension).  High cholesterol.  Diabetes mellitus.  Heart disease.  Being [place] American, Native American, Hispanic, or [place] Native.  Being over age 52.  Family history of stroke.  Previous history of blood clots, stroke, or transient ischemic attack (TIA).  Sickle cell disease.  Being a woman with a history of preeclampsia.  Migraine headache.  Sleep apnea.  Irregular heartbeats, such as atrial fibrillation.  Chronic inflammatory diseases, such as rheumatoid arthritis or lupus.  Blood clotting disorders (hypercoagulable state). What are the signs or symptoms? Symptoms of this condition usually develop  suddenly, or you may notice them after waking up from sleep. Symptoms may include sudden:  Weakness or numbness in your face, arm, or leg, especially on one side of your body.  Trouble walking or difficulty moving your arms or legs.  Loss of balance or coordination.  Confusion.  Slurred speech (dysarthria).  Trouble speaking, understanding speech, or both (aphasia).  Vision changes-such as double vision, blurred vision, or loss of vision-in one or both eyes.  Dizziness.  Nausea and vomiting.  Severe headache with no known cause. The headache is often described as the worst headache ever experienced. If possible, make note of the exact time that you last felt like your normal self and what time your symptoms started. Tell your health care provider. If symptoms come and go, this could be a sign of a warning stroke, or TIA. Get help right away, even if you feel better. How is this diagnosed? This condition may be diagnosed based on:  Your symptoms, your medical history, and a physical exam.  CT scan of the brain.  MRI.  CT angiogram. This test uses a computer to take X-rays of your arteries. A dye may be injected into your blood to show the inside of your blood vessels more clearly.  MRI angiogram. This is a type of MRI that is used to evaluate the blood vessels.  Cerebral angiogram. This test uses X-rays and a dye to show the blood vessels in the brain and neck. You may need to see a health care provider who specializes in stroke care. A stroke specialist can be seen in person or through communication using telephone or television technology (telemedicine). Other tests  may also be done to find the cause of the stroke, such as:  Electrocardiogram (ECG).  Continuous heart monitoring.  Echocardiogram.  Transesophageal echocardiogram (TEE).  Carotid ultrasound.  A scan of the brain circulation.  Blood tests.  Sleep study to check for sleep apnea. How is this treated?  Treatment for this condition will depend on the duration, severity, and cause of your symptoms and on the area of the brain affected. It is very important to get treatment at the first sign of stroke symptoms. Some treatments work better if they are done within 3-6 hours of the onset of stroke symptoms. These initial treatments may include:  Aspirin.  Medicines to control blood pressure.  Medicine given by injection to dissolve the blood clot (thrombolytic).  Treatments given directly to the affected artery to remove or dissolve the blood clot. Other treatment options may include:  Oxygen.  IV fluids.  Medicines to thin the blood (anticoagulants or antiplatelets).  Procedures to increase blood flow. Medicines and changes to your diet may be used to help treat and manage risk factors for stroke, such as diabetes, high cholesterol, and high blood pressure. After a stroke, you may work with physical, speech, mental health, or occupational therapists to help you recover. Follow these instructions at home: Medicines  Take over-the-counter and prescription medicines only as told by your health care provider.  If you were told to take a medicine to thin your blood, such as aspirin or an anticoagulant, take it exactly as told by your health care provider. ? Taking too much blood-thinning medicine can cause bleeding. ? If you do not take enough blood-thinning medicine, you will not have the protection that you need against another stroke and other problems.  Understand the side effects of taking anticoagulant medicine. When taking this type of medicine, make sure you: ? Hold pressure over any cuts for longer than usual. ? Tell your dentist and other health care providers that you are taking anticoagulants before you have any procedures that may cause bleeding. ? Avoid activities that may cause trauma or injury. Eating and drinking  Follow instructions from your health care provider about  diet.  Eat healthy foods.  If your ability to swallow was affected by the stroke, you may need to take steps to avoid choking, such as: ? Taking small bites when eating. ? Eating foods that are soft or pureed. Safety  Follow instructions from your health care team about physical activity.  Use a walker or cane as told by your health care provider.  Take steps to create a safe home environment in order to reduce the risk of falls. This may include: ? Having your home looked at by specialists. ? Installing grab bars in the bedroom and bathroom. ? Using safety equipment, such as raised toilets and a seat in the shower. General instructions  Do not use any tobacco products, such as cigarettes, chewing tobacco, and e-cigarettes. If you need help quitting, ask your health care provider.  Limit alcohol intake to no more than 1 drink a day for nonpregnant women and 2 drinks a day for men. One drink equals 12 oz of beer, 5 oz of wine, or 1 oz of hard liquor.  If you need help to stop using drugs or alcohol, ask your health care provider about a referral to a program or specialist.  Maintain an active and healthy lifestyle. Get regular exercise as told by your health care provider.  Keep all follow-up visits as told  by your health care provider, including visits with all specialists on your health care team. This is important. How is this prevented? Your risk of another stroke can be decreased by managing high blood pressure, high cholesterol, diabetes, heart disease, sleep apnea, and obesity. It can also be decreased by quitting smoking, limiting alcohol, and staying physically active. Your health care provider will continue to work with you on measures to prevent short-term and long-term complications of stroke. Get help right away if:   You have any symptoms of a stroke. "BE FAST" is an easy way to remember the main warning signs of a stroke: ? B - Balance. Signs are dizziness, sudden  trouble walking, or loss of balance. ? E - Eyes. Signs are trouble seeing or a sudden change in vision. ? F - Face. Signs are sudden weakness or numbness of the face, or the face or eyelid drooping on one side. ? A - Arms. Signs are weakness or numbness in an arm. This happens suddenly and usually on one side of the body. ? S - Speech. Signs are sudden trouble speaking, slurred speech, or trouble understanding what people say. ? T - Time. Time to call emergency services. Write down what time symptoms started.  You have other signs of a stroke, such as: ? A sudden, severe headache with no known cause. ? Nausea or vomiting. ? Seizure.  These symptoms may represent a serious problem that is an emergency. Do not wait to see if the symptoms will go away. Get medical help right away. Call your local emergency services (911 in the [place] Do not drive yourself to the hospital. Summary  An ischemic stroke (cerebrovascular accident, or CVA) is the sudden death of brain tissue that occurs when an area of the brain does not get enough oxygen.  Symptoms of this condition usually develop suddenly, or you may notice them after waking up from sleep.  It is very important to get treatment at the first sign of stroke symptoms. Stroke is a medical emergency that must be treated right away. This information is not intended to replace advice given to you by your health care provider. Make sure you discuss any questions you have with your health care provider. Document Released: 12/03/2005 Document Revised: 08/22/2018 Document Reviewed: 02/29/2016 Elsevier Patient Education  [place].

## 2019-11-06 ENCOUNTER — Telehealth: Payer: Self-pay | Admitting: Internal Medicine

## 2019-11-06 ENCOUNTER — Ambulatory Visit: Payer: Self-pay | Admitting: Pharmacist

## 2019-11-06 MED FILL — ATORVASTATIN 10 MG TABLET: 10 | 30 days supply | Qty: 30 | Fill #0

## 2019-11-06 NOTE — Telephone Encounter (Signed)
Patient would like the nurse to giver her a call about the test your supposed to order

## 2019-11-08 DIAGNOSIS — I639 Cerebral infarction, unspecified: Secondary | ICD-10-CM | POA: Insufficient documentation

## 2019-11-08 DIAGNOSIS — E1169 Type 2 diabetes mellitus with other specified complication: Secondary | ICD-10-CM | POA: Insufficient documentation

## 2019-11-08 DIAGNOSIS — E782 Mixed hyperlipidemia: Secondary | ICD-10-CM | POA: Insufficient documentation

## 2019-11-08 DIAGNOSIS — I6521 Occlusion and stenosis of right carotid artery: Secondary | ICD-10-CM | POA: Insufficient documentation

## 2019-11-08 MED ORDER — SENNOSIDES-DOCUSATE SODIUM 8.6-50 MG PO TABS: 1.0000 | ORAL_TABLET | Freq: Every day | ORAL | 2 refills | Status: DC | PRN

## 2019-11-08 MED ORDER — TRUE METRIX METER W/DEVICE KIT: PACK | 0 refills | Status: DC

## 2019-11-09 ENCOUNTER — Telehealth: Payer: Self-pay

## 2019-11-09 NOTE — Telephone Encounter (Signed)
Call placed to patient at request of Dr Wynetta Emery. Patient would like to apply for medicaid.  Informed her that an application will be left at [facility] front desk for her to pick up . She said she would stop by tomorrow to get it. She noted that she is applying as a single person.

## 2019-11-10 NOTE — Telephone Encounter (Signed)
Dr. Wynetta Emery has to change an order once one it's changed I will contact the scheduling department and schedule both images

## 2019-11-16 ENCOUNTER — Encounter (HOSPITAL_COMMUNITY): Payer: Self-pay

## 2019-11-16 NOTE — BH Specialist Note (Signed)
Integrated Behavioral Health Initial Visit  MRN: [id number] Name: Nancy [name]  Number of Welcome Clinician visits:: 1/6 Session Start time: 4:25 PM  Session End time: 4:50 PM Total time: 25  Type of Service: [place] Interpretor:No. Interpretor Name and Language: NA   Warm Hand Off Completed.       SUBJECTIVE: Nancy [name] is a 58 y.o. female accompanied by self Patient was referred by Dr. Wynetta Emery for tactile hallucinations. Patient reports the following symptoms/concerns: Pt reports a strong decrease in previous reported tactile hallucinations since the implementation of Risperidone. She is open to following up with [facility] for psychiarty Duration of problem: weeks; Severity of problem: moderate  OBJECTIVE: Mood: Pleasant and Affect: Appropriate Risk of harm to self or others: No plan to harm self or others  LIFE CONTEXT: Family and Social: Pt receives strong support from sister School/Work: Pt is interested in applying for [employer] to assist with medical coverage Self-Care: Pt is open to establishing with [facility] of the [place] for medication management Life Changes: Pt was recently hospitalized at [facility] for approx 10 days due to tactile hallucinations. Pt has been compliant with medication management and is interested in follow up care  GOALS ADDRESSED: Patient will: 1. Reduce symptoms of: anxiety and depression 2. Increase knowledge and/or ability of: coping skills and healthy habits  3. Demonstrate ability to: Increase adequate support systems for patient/family  INTERVENTIONS: Interventions utilized: Solution-Focused Strategies, Supportive Counseling and Psychoeducation and/or Health Education  Standardized Assessments completed: GAD-7 and PHQ 2&9  ASSESSMENT: Patient was recently hospitalized at [facility] for approx 10 days due to tactile hallucinations.  Pt reports a strong decrease in symptoms since the implementation of Risperidone. She is open to following up with [facility] of the [place] for psychiarty.   LCSW commended patient on compliance with medication management. Healthy coping skills were discussed and contact information for [facility] was provided.   PLAN: 1. Follow up with behavioral health clinician on : Contact LCSW with any additional behavioral health and/or resource needs 2. Behavioral recommendations: Continue utilizing healthy coping skills and follow up with [facility] of the [place] for medication management 3. Referral(s): [facility] (In Clinic) and [facility] (LME/Outside Clinic) 4. "From scale of 1-10, how likely are you to follow plan?": [place], LCSW 11/16/2019 12:10 PM

## 2019-11-17 ENCOUNTER — Ambulatory Visit (HOSPITAL_BASED_OUTPATIENT_CLINIC_OR_DEPARTMENT_OTHER)
Admission: RE | Admit: 2019-11-17 | Discharge: 2019-11-17 | Disposition: A | Discharge status: Home / Self Care | Payer: Self-pay | Source: Ambulatory Visit | Attending: Internal Medicine | Admitting: Internal Medicine

## 2019-11-17 ENCOUNTER — Ambulatory Visit (HOSPITAL_COMMUNITY)
Admission: RE | Admit: 2019-11-17 | Discharge: 2019-11-17 | Disposition: A | Discharge status: Home / Self Care | Payer: Self-pay | Source: Ambulatory Visit | Attending: Internal Medicine | Admitting: Internal Medicine

## 2019-11-17 ENCOUNTER — Other Ambulatory Visit: Payer: Self-pay

## 2019-11-17 DIAGNOSIS — E669 Obesity, unspecified: Secondary | ICD-10-CM | POA: Insufficient documentation

## 2019-11-17 DIAGNOSIS — I639 Cerebral infarction, unspecified: Secondary | ICD-10-CM

## 2019-11-17 DIAGNOSIS — I340 Nonrheumatic mitral (valve) insufficiency: Secondary | ICD-10-CM | POA: Insufficient documentation

## 2019-11-17 DIAGNOSIS — I6521 Occlusion and stenosis of right carotid artery: Secondary | ICD-10-CM | POA: Insufficient documentation

## 2019-11-17 DIAGNOSIS — E119 Type 2 diabetes mellitus without complications: Secondary | ICD-10-CM | POA: Insufficient documentation

## 2019-11-17 DIAGNOSIS — H3530 Unspecified macular degeneration: Secondary | ICD-10-CM

## 2019-11-17 DIAGNOSIS — E785 Hyperlipidemia, unspecified: Secondary | ICD-10-CM | POA: Insufficient documentation

## 2019-11-17 DIAGNOSIS — I10 Essential (primary) hypertension: Secondary | ICD-10-CM | POA: Insufficient documentation

## 2019-11-17 HISTORY — PX: CATARACT EXTRACTION: SUR2

## 2019-11-17 HISTORY — DX: Unspecified macular degeneration: H35.30

## 2019-11-17 NOTE — Progress Notes (Signed)
*  PRELIMINARY RESULTS* Echocardiogram 2D Echocardiogram has been performed.  11/17/2019, 3:59 PM

## 2019-11-18 ENCOUNTER — Telehealth: Payer: Self-pay | Admitting: Internal Medicine

## 2019-11-18 NOTE — Telephone Encounter (Signed)
PC placed to pt today to discuss echo and carotid doppler results.  Left VMM informing of who I am, reason for call and that I will have my medical assistant reach out to her again to inform of results. Please let pt know that her echo came back okay showing good heart function and no significant valve disorders.  Carotid US showed complete blockage of mid portion of the RT carotid artery and mid to moderate narrowing of the left carotid artery. These blockages contributed to her having had stroke and increase risk for future stroke. Very important that she takes an aspirin and cholesterol lowering med daily. I would like to refer her to neurologist as soon as she gets [facility] discount or Medicaid.

## 2019-11-18 NOTE — Telephone Encounter (Signed)
Contacted pt to go over Dr. Wynetta Emery message pt is aware and doesn't;t have any questions or concerns

## 2019-11-27 ENCOUNTER — Telehealth: Payer: Self-pay | Admitting: Licensed Clinical Social Worker

## 2019-11-27 NOTE — Telephone Encounter (Signed)
Follow up call placed to patient. LCSW left message requesting a return call.  

## 2019-12-07 ENCOUNTER — Other Ambulatory Visit: Payer: Self-pay | Admitting: Internal Medicine

## 2019-12-07 DIAGNOSIS — M542 Cervicalgia: Secondary | ICD-10-CM

## 2019-12-07 DIAGNOSIS — G8929 Other chronic pain: Secondary | ICD-10-CM

## 2019-12-07 MED FILL — GABAPENTIN 300 MG CAPSULE: 300 | 30 days supply | Qty: 30 | Fill #2

## 2019-12-07 MED FILL — traZODone HCL 50 MG TABS: 50 | 30 days supply | Qty: 30 | Fill #1

## 2019-12-08 MED FILL — CYCLOBENZAPRINE 5 MG TABLET: 5 | 30 days supply | Qty: 60 | Fill #0

## 2019-12-22 ENCOUNTER — Ambulatory Visit: Payer: Self-pay | Admitting: Internal Medicine

## 2019-12-31 ENCOUNTER — Ambulatory Visit: Payer: Self-pay | Admitting: Internal Medicine

## 2020-01-04 MED FILL — EZETIMIBE 10 MG TAB: 10 | 30 days supply | Qty: 30 | Fill #2

## 2020-01-04 MED FILL — HYDROCHLOROTHIAZIDE 12.5 MG: 12.5 | 30 days supply | Qty: 30 | Fill #1

## 2020-01-04 MED FILL — LISINOPRIL 40 MG TABLET: 40 | 30 days supply | Qty: 30 | Fill #2

## 2020-01-04 MED FILL — risperiDONE 2 MG TABS: 2 | 30 days supply | Qty: 30 | Fill #1

## 2020-01-04 MED FILL — DULoxetine HCL 20 MG CPEP: 20 | 30 days supply | Qty: 30 | Fill #2

## 2020-01-04 MED FILL — CYCLOBENZAPRINE 5 MG TABLET: 5 | 30 days supply | Qty: 60 | Fill #0

## 2020-01-04 MED FILL — NOVOLOG FLEXPEN SYRINGE: 100 | 21 days supply | Qty: 3 | Fill #2

## 2020-01-21 ENCOUNTER — Ambulatory Visit: Payer: Self-pay | Admitting: Internal Medicine

## 2020-02-09 ENCOUNTER — Encounter: Payer: Self-pay | Admitting: Internal Medicine

## 2020-02-09 ENCOUNTER — Ambulatory Visit: Payer: 59 | Attending: Internal Medicine | Admitting: Internal Medicine

## 2020-02-09 ENCOUNTER — Other Ambulatory Visit: Payer: Self-pay

## 2020-02-09 DIAGNOSIS — E1165 Type 2 diabetes mellitus with hyperglycemia: Secondary | ICD-10-CM | POA: Diagnosis not present

## 2020-02-09 DIAGNOSIS — Z1211 Encounter for screening for malignant neoplasm of colon: Secondary | ICD-10-CM

## 2020-02-09 DIAGNOSIS — I6523 Occlusion and stenosis of bilateral carotid arteries: Secondary | ICD-10-CM | POA: Diagnosis not present

## 2020-02-09 DIAGNOSIS — IMO0002 Reserved for concepts with insufficient information to code with codable children: Secondary | ICD-10-CM

## 2020-02-09 DIAGNOSIS — I639 Cerebral infarction, unspecified: Secondary | ICD-10-CM

## 2020-02-09 DIAGNOSIS — E1140 Type 2 diabetes mellitus with diabetic neuropathy, unspecified: Secondary | ICD-10-CM | POA: Diagnosis not present

## 2020-02-09 DIAGNOSIS — D490 Neoplasm of unspecified behavior of digestive system: Secondary | ICD-10-CM

## 2020-02-09 DIAGNOSIS — F419 Anxiety disorder, unspecified: Secondary | ICD-10-CM | POA: Diagnosis not present

## 2020-02-09 DIAGNOSIS — E1169 Type 2 diabetes mellitus with other specified complication: Secondary | ICD-10-CM

## 2020-02-09 DIAGNOSIS — F329 Major depressive disorder, single episode, unspecified: Secondary | ICD-10-CM

## 2020-02-09 DIAGNOSIS — Z5320 Procedure and treatment not carried out because of patient's decision for unspecified reasons: Secondary | ICD-10-CM

## 2020-02-09 DIAGNOSIS — Z794 Long term (current) use of insulin: Secondary | ICD-10-CM

## 2020-02-09 DIAGNOSIS — E785 Hyperlipidemia, unspecified: Secondary | ICD-10-CM

## 2020-02-09 DIAGNOSIS — F32A Depression, unspecified: Secondary | ICD-10-CM

## 2020-02-09 MED ORDER — ATORVASTATIN CALCIUM 20 MG PO TABS: 20.0000 mg | ORAL_TABLET | Freq: Every day | ORAL | 3 refills | Status: DC

## 2020-02-09 MED ORDER — LANTUS SOLOSTAR 100 UNIT/ML ~~LOC~~ SOPN: 60.0000 [IU] | PEN_INJECTOR | Freq: Every day | SUBCUTANEOUS | 11 refills | Status: DC

## 2020-02-09 NOTE — Progress Notes (Signed)
Virtual Visit via Telephone Note Due to current restrictions/limitations of in-office visits due to the COVID-19 pandemic, this scheduled clinical appointment was converted to a telehealth visit  I connected with Nancy [name] on 02/09/20 at 9:20 a.m by telephone and verified that I am speaking with the correct person using two identifiers. I am in my office.  The patient is at home.  Only the patient and myself participated in this encounter.  I discussed the limitations, risks, security and privacy concerns of performing an evaluation and management service by telephone and the availability of in person appointments. I also discussed with the patient that there may be a patient responsible charge related to this service. The patient expressed understanding and agreed to proceed.   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  Patient was last seen 10/2019.  Purpose of today's visit is chronic disease management.  Since last visit she now has insurance.  She has not filed the paperwork for disability as yet but still plans to do so..  Hallucinations/dep/anxiety: since last visit, she did not get in with [facility].  Has transportation issues and loss paperwork to take with her.  Doing well on Risperidone 2 mg.  Needs refill. -feels depression/anxiety doing okay on Cymbalta.  No SI.  CVA: On last visit I had recommended starting statin therapy; she had declined it in the past fearing possible side effects.  Tolerating Atorvastatin and ASA.  Echo was normal.  Carotid ultrasound revealed occluded mid right ICA and 40-50% occlusion of the left ICA.   We had plan to refer her to neurology once she has insurance.  DIABETES TYPE 2 Last A1C:   Results for orders placed or performed in visit on 11/05/19  Glucose (CBG)  Result Value Ref Range    POC Glucose 184 (A) 70 - 99 mg/dl    Lab Results  Component Value Date   HGBA1C 12.5 (A) 08/27/2019   Med Adherence:  '[x]'$  Yes -reports taking taking Lantus 55 units and Novolog 6 with dinner (suppose to be with BF and dinner)     Medication side effects:  '[]'$  Yes    '[x]'$  No Home Monitoring?  '[x]'$  Yes BID before BF and dinner    Home glucose results range: before BF - This morning was 267.  Other readings 248, 197, 295.  Before dinner: 149, 168, 195 Diet Adherence: '[x]'$  Yes    '[]'$  No Exercise: '[x]'$  Yes -walking  Hypoglycemic episodes?: '[]'$  Yes    '[x]'$  No Numbness of the feet? '[x]'$  Yes; tingling and numbness in hands also.  No ulcers on feet   Retinopathy hx? '[]'$  Yes    '[]'$  No Last eye exam: Had cataract surgery LT 11/18/2019 Dr. Schuyler Amor. Some blurred vision on RT.  Told she has macular degeneration BL Comments:   HYPERTENSION Currently taking: see medication list Med Adherence: '[x]'$  Yes    '[]'$  No Medication side effects: '[]'$  Yes    '[x]'$  No Adherence with salt restriction: '[x]'$  Yes    '[]'$  No Home Monitoring?: '[]'$  Yes    '[x]'$  No - no device to check Monitoring Frequency: '[]'$  Yes    '[]'$  No Home BP results range: '[]'$  Yes    '[]'$  No SOB? '[]'$  Yes    '[x]'$  No Chest Pain?: '[]'$  Yes    '[x]'$  No Leg swelling?: '[]'$  Yes    '[x]'$  No Headaches?: '[]'$  Yes    [  x] No Dizziness? '[]'$  Yes    '[x]'$  No Comments:   Parotid Tumor:  Saw ENT, Dr. Redmond Baseman, since last visit. 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   Outpatient Encounter Medications as of 02/09/2020  Medication Sig  . acetaminophen (TYLENOL) 500 MG tablet Take 2 tablets (1,000 mg total) by mouth every 8 (eight) hours as needed for mild pain or moderate pain.  Marland Kitchen aspirin EC 81 MG tablet Take 1 tablet (81 mg total) by mouth daily.  Marland Kitchen atorvastatin (LIPITOR) 10 MG tablet Take 1 tablet (10 mg total) by mouth daily.  . Blood Glucose Monitoring Suppl (TRUE METRIX METER) w/Device KIT Use as directed  .  cyclobenzaprine (FLEXERIL) 5 MG tablet Take 1 tablet (5 mg total) by mouth 2 (two) times daily as needed for muscle spasms.  . diclofenac sodium (VOLTAREN) 1 % GEL Apply 4 g topically 4 (four) times daily.  . DULoxetine (CYMBALTA) 20 MG capsule Take 1 capsule (20 mg total) by mouth daily.  Marland Kitchen gabapentin (NEURONTIN) 300 MG capsule Take 1 capsule (300 mg total) by mouth at bedtime.  . hydrochlorothiazide (HYDRODIURIL) 12.5 MG tablet Take 1 tablet (12.5 mg total) by mouth daily.  . insulin aspart (NOVOLOG FLEXPEN) 100 UNIT/ML FlexPen 7 units subcut with breakfast and dinner (Patient taking differently: Inject 7 Units into the skin 2 (two) times daily. 7 units subcut with breakfast and dinner)  . Insulin Pen Needle (PEN NEEDLES) 32G X 4 MM MISC 1 each by Does not apply route 4 (four) times daily.  Marland Kitchen LANTUS SOLOSTAR 100 UNIT/ML Solostar Pen INJECT 65 UNITS INTO THE SKIN AT BEDTIME.  Marland Kitchen lisinopril (ZESTRIL) 40 MG tablet Take 1 tablet (40 mg total) by mouth daily.  Marland Kitchen omeprazole (PRILOSEC) 20 MG capsule TAKE 1 CAPSULE (20 MG TOTAL) BY MOUTH DAILY.  Marland Kitchen senna-docusate (SENOKOT S) 8.6-50 MG tablet Take 1 tablet by mouth daily as needed for moderate constipation.  . traZODone (DESYREL) 50 MG tablet TAKE 1/2-1 TABLETS (25-50 MG TOTAL) BY MOUTH AT BEDTIME AS NEEDED FOR SLEEP. MUST MAKE APPT FOR FURTHER REFILLS (Patient not taking: Reported on 10/08/2019)  . [DISCONTINUED] Insulin NPH Isophane & Regular (RELION 70/30 [place]) Inject 35 Units into the skin 2 (two) times daily.   No facility-administered encounter medications on file as of 02/09/2020.      Observations/Objective: Depression screen [facility] 2/9 02/09/2020 11/05/2019 08/27/2018  Decreased Interest '1 1 1  '$ Down, Depressed, Hopeless '3 1 1  '$ PHQ - 2 Score '4 2 2  '$ Altered sleeping 2 2 0  Tired, decreased energy '1 1 3  '$ Change in appetite 2 - 1  Feeling bad or failure about yourself  '1 1 2  '$ Trouble concentrating '1 1 1  '$ Moving slowly or fidgety/restless 0 0 0   Suicidal thoughts 0 0 0  PHQ-9 Score '11 7 9  '$ Some recent data might be hidden   GAD 7 : Generalized Anxiety Score 02/09/2020 11/05/2019 08/27/2018 04/18/2018  Nervous, Anxious, on Edge '1 1 1 1  '$ Control/stop worrying 2 - 2 2  Worry too much - different things '1 2 1 2  '$ Trouble relaxing '1 3 1 1  '$ Restless 1 - 0 0  Easily annoyed or irritable 0 1 1 0  Afraid - awful might happen '1 2 1 2  '$ Total GAD 7 Score 7 - 7 8     Assessment and Plan: 1. Uncontrolled type 2 diabetes mellitus with diabetic neuropathy,  with long-term current use of insulin ([place]) Not at goal.  I recommend increasing Lantus insulin to 60 units daily.  Also reminded her that the NovoLog is to be taken twice a day with breakfast and dinner.  Advised to continue checking blood sugars at least twice a day before meals and bring in readings in 2 to 3 weeks to meet with our clinical pharmacist.  Continue to counsel her on healthy eating habits. - Microalbumin / creatinine urine ratio; Future - Hemoglobin A1c; Future - Insulin Glargine (LANTUS SOLOSTAR) 100 UNIT/ML Solostar Pen; Inject 60 Units into the skin at bedtime.  Dispense: 15 mL; Refill: 11  2. Bilateral carotid artery stenosis  3. Cerebrovascular accident (CVA), unspecified mechanism ([place]) Continue aspirin.  Since she has tolerated the low-dose atorvastatin I will increase the dose further to 20 mg daily.  We can continue to titrate the dose once she tolerates. - Ambulatory referral to Neurology - atorvastatin (LIPITOR) 20 MG tablet; Take 1 tablet (20 mg total) by mouth daily.  Dispense: 90 tablet; Refill: 3  4. Hyperlipidemia associated with type 2 diabetes mellitus ([place]) See #3 above - Lipid panel; Future - atorvastatin (LIPITOR) 20 MG tablet; Take 1 tablet (20 mg total) by mouth daily.  Dispense: 90 tablet; Refill: 3  5. Anxiety and depression Continue Cymbalta  6. Parotid tumor We will send her back to Dr. Redmond Baseman now that she has insurance - Ambulatory referral  to ENT  7. Screening for colon cancer Discussed colon cancer screening.  She is agreeable for referral for colonoscopy - Ambulatory referral to Gastroenterology  8. Screening mammography declined   Follow Up Instructions: Follow-up with clinical pharmacist in 3 weeks for recheck on diabetes. Follow-up with me in 3 months.   I discussed the assessment and treatment plan with the patient. The patient was provided an opportunity to ask questions and all were answered. The patient agreed with the plan and demonstrated an understanding of the instructions.   The patient was advised to call back or seek an in-person evaluation if the symptoms worsen or if the condition fails to improve as anticipated.  I provided 22 minutes of non-face-to-face time during this encounter.   [name], MD

## 2020-02-09 NOTE — Progress Notes (Signed)
Pt states her blood sugar this morning was 267

## 2020-02-10 MED FILL — ATORVASTATIN CALCIUM 20 MG: 20 | 30 days supply | Qty: 30 | Fill #0

## 2020-02-10 MED FILL — LANTUS SOLOSTAR 100 UNITS/M: 100 | 25 days supply | Qty: 15 | Fill #0

## 2020-02-11 ENCOUNTER — Ambulatory Visit: Payer: 59 | Attending: Internal Medicine

## 2020-02-11 ENCOUNTER — Other Ambulatory Visit: Payer: Self-pay

## 2020-02-11 DIAGNOSIS — E1140 Type 2 diabetes mellitus with diabetic neuropathy, unspecified: Secondary | ICD-10-CM

## 2020-02-11 DIAGNOSIS — E1169 Type 2 diabetes mellitus with other specified complication: Secondary | ICD-10-CM

## 2020-02-11 DIAGNOSIS — IMO0002 Reserved for concepts with insufficient information to code with codable children: Secondary | ICD-10-CM

## 2020-02-12 ENCOUNTER — Other Ambulatory Visit: Payer: Self-pay | Admitting: Internal Medicine

## 2020-02-12 DIAGNOSIS — I10 Essential (primary) hypertension: Secondary | ICD-10-CM

## 2020-02-12 DIAGNOSIS — IMO0002 Reserved for concepts with insufficient information to code with codable children: Secondary | ICD-10-CM

## 2020-02-12 DIAGNOSIS — E1140 Type 2 diabetes mellitus with diabetic neuropathy, unspecified: Secondary | ICD-10-CM

## 2020-02-12 LAB — HEMOGLOBIN A1C
Est. average glucose Bld gHb Est-mCnc: 326 mg/dL
Hgb A1c MFr Bld: 13 % — ABNORMAL HIGH (ref 4.8–5.6)

## 2020-02-12 LAB — LIPID PANEL
Chol/HDL Ratio: 4.8 ratio — ABNORMAL HIGH (ref 0.0–4.4)
Cholesterol, Total: 193 mg/dL (ref 100–199)
HDL: 40 mg/dL (ref >39)
LDL Chol Calc (NIH): 122 mg/dL — ABNORMAL HIGH (ref 0–99)
Triglycerides: 177 mg/dL — ABNORMAL HIGH (ref 0–149)
VLDL Cholesterol Cal: 31 mg/dL (ref 5–40)

## 2020-02-12 LAB — MICROALBUMIN / CREATININE URINE RATIO
Creatinine, Urine: 189.7 mg/dL
Microalb/Creat Ratio: 38 mg/g creat — ABNORMAL HIGH (ref 0–29)
Microalbumin, Urine: 72.4 ug/mL

## 2020-02-12 MED FILL — EZETIMIBE 10 MG TAB: 10 | 30 days supply | Qty: 30 | Fill #3

## 2020-02-12 MED FILL — HYDROCHLOROTHIAZIDE 12.5 MG: 12.5 | 30 days supply | Qty: 30 | Fill #2

## 2020-02-12 MED FILL — GABAPENTIN 300 MG CAPSULE: 300 | 30 days supply | Qty: 30 | Fill #1

## 2020-02-12 MED FILL — traZODone HCL 50 MG TABS: 50 | 30 days supply | Qty: 30 | Fill #2

## 2020-02-15 ENCOUNTER — Ambulatory Visit: Payer: Self-pay | Admitting: Internal Medicine

## 2020-02-15 MED FILL — DULoxetine HCL 20 MG CPEP: 20 | 30 days supply | Qty: 30 | Fill #0

## 2020-02-15 MED FILL — LISINOPRIL 40 MG TABLET: 40 | 30 days supply | Qty: 30 | Fill #0

## 2020-02-17 ENCOUNTER — Telehealth: Payer: Self-pay

## 2020-02-17 NOTE — Telephone Encounter (Signed)
Contacted pt to go over lab results pt is aware and doesn't have any questions or concerns 

## 2020-02-18 ENCOUNTER — Encounter: Payer: Self-pay | Admitting: Neurology

## 2020-02-18 ENCOUNTER — Other Ambulatory Visit: Payer: Self-pay

## 2020-02-18 ENCOUNTER — Ambulatory Visit (INDEPENDENT_AMBULATORY_CARE_PROVIDER_SITE_OTHER): Payer: 59 | Admitting: Neurology

## 2020-02-18 VITALS — BP 108/76 | HR 92 | Temp 97.7°F | Ht 63.0 in | Wt 202.0 lb

## 2020-02-18 DIAGNOSIS — R269 Unspecified abnormalities of gait and mobility: Secondary | ICD-10-CM

## 2020-02-18 DIAGNOSIS — G3281 Cerebellar ataxia in diseases classified elsewhere: Secondary | ICD-10-CM

## 2020-02-18 NOTE — Progress Notes (Addendum)
Reason for visit: Cerebrovascular disease  Referring physician: Dr. Lucilla Edin is a 59 y.o. female  History of present illness:  Nancy [name] is a 59 year old right-handed black female with a history of an admission to the hospital on 07 October 2019.  The patient was having hallucinations at that time.  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  The patient eventually underwent a work-up for this with a carotid Doppler study that confirmed an occlusion of the right carotid artery, a 2D echocardiogram showed no evidence of any source of cardiogenic emboli.  The patient has been treated with aspirin.  She has a history of hypertension and diabetes and dyslipidemia.  The patient is sent to this office for further evaluation.  The patient claims that she has had some gait instability over the last 3 months or so.  The actual time of onset of the stroke that was seen by MRI is unclear.  The patient claims however over the last 3 to 4 weeks she has had a change in her ability to walk, she has noted that her left leg is now collapsing on her, she has fallen on 2 occasions.  She is using a walker or cane for ambulation.  She does report some low back pain but no pain going down the legs.  She has had daily headaches over the last 2 weeks, prior to this she was having only 1 or 2 a month.  The headaches are in the frontal areas bilaterally.  The patient does have some numbness in the hands and feet associated with her diabetes that has been present for several years.  The patient does notice some palpitations of heart at times.  She reports some problems with constipation, here recently she has had some difficulty with urinary incontinence as well.  The patient is sent here for further evaluation.  Past Medical History:  Diagnosis Date    . Anemia 2006  . Depression 2014   previously on amitryptiline   . 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 Fracture of left ankle 1997   . Gastroparesis 07/2016  . HOH (hard of hearing) 2004   . Hyperlipidemia 2006  . Hypertension 2006  . IBS (irritable bowel syndrome) 2002   . Leukopenia 2015   . Macular degeneration 11/2019  . Shingles 2009   . Stroke ([place]) 2020  . Thyroid nodule 2004    Past Surgical History:  Procedure Laterality Date  . ABDOMINAL HYSTERECTOMY  2005  . CATARACT EXTRACTION Left 11/2019  . CESAREAN SECTION  1983   . ESOPHAGOGASTRODUODENOSCOPY (EGD) WITH PROPOFOL Left 08/26/2014   Procedure: ESOPHAGOGASTRODUODENOSCOPY (EGD) WITH PROPOFOL;  Surgeon: Arta Silence, MD;  Location: [facility];  Service: Endoscopy;  Laterality: Left;    Family History  Problem Relation Age of Onset  . Hypertension Mother   . Heart disease Mother   . Diabetes Mother   . Thyroid disease Mother   . Congestive Heart Failure Mother   . Breast cancer Maternal Grandmother   . Colon cancer Maternal Grandfather   . Heart attack Sister   . Heart disease Brother   . Hyperlipidemia Brother   . Hypertension Brother   . Diabetes Father   . Breast cancer Maternal Aunt     Social history:  reports that she  has quit smoking. Her smoking use included cigarettes. She has a 0.13 pack-year smoking history. She has never used smokeless tobacco. She reports that she does not drink alcohol or use drugs.  Medications:  Prior to Admission medications   Medication Sig Start Date End Date Taking? Authorizing Provider  acetaminophen (TYLENOL) 500 MG tablet Take 2 tablets (1,000 mg total) by mouth every 8 (eight) hours as needed for mild pain or moderate pain. 02/03/18  Yes Alfonse Spruce, FNP  aspirin EC 81 MG tablet Take 1 tablet (81 mg total) by mouth daily. 11/05/19  Yes [name], MD  atorvastatin (LIPITOR) 20 MG tablet Take 1 tablet (20 mg total) by  mouth daily. 02/09/20  Yes [name], MD  Blood Glucose Monitoring Suppl (TRUE METRIX METER) w/Device KIT Use as directed 11/08/19  Yes [name], MD  cyclobenzaprine (FLEXERIL) 5 MG tablet Take 1 tablet (5 mg total) by mouth 2 (two) times daily as needed for muscle spasms. 12/08/19  Yes [name], MD  diclofenac sodium (VOLTAREN) 1 % GEL Apply 4 g topically 4 (four) times daily. 09/14/16  Yes Funches, Josalyn, MD  DULoxetine (CYMBALTA) 20 MG capsule TAKE 1 CAPSULE (20 MG TOTAL) BY MOUTH DAILY. 02/12/20  Yes [name], MD  ezetimibe (ZETIA) 10 MG tablet Take 10 mg by mouth daily. 02/12/20  Yes [provider]  gabapentin (NEURONTIN) 300 MG capsule Take 1 capsule (300 mg total) by mouth at bedtime. 08/27/19  Yes [name], MD  hydrochlorothiazide (HYDRODIURIL) 12.5 MG tablet Take 1 tablet (12.5 mg total) by mouth daily. 08/27/19  Yes [name], MD  insulin aspart (NOVOLOG FLEXPEN) 100 UNIT/ML FlexPen 7 units subcut with breakfast and dinner Patient taking differently: Inject 7 Units into the skin 2 (two) times daily. 7 units subcut with breakfast and dinner 08/27/19  Yes [name], MD  Insulin Glargine (LANTUS SOLOSTAR) 100 UNIT/ML Solostar Pen Inject 60 Units into the skin at bedtime. 02/09/20  Yes [name], MD  Insulin Pen Needle (PEN NEEDLES) 32G X 4 MM MISC 1 each by Does not apply route 4 (four) times daily. 12/04/16  Yes Funches, Josalyn, MD  lisinopril (ZESTRIL) 40 MG tablet TAKE 1 TABLET (40 MG TOTAL) BY MOUTH DAILY. 02/12/20  Yes [name], MD  omeprazole (PRILOSEC) 20 MG capsule TAKE 1 CAPSULE (20 MG TOTAL) BY MOUTH DAILY. 06/24/19  Yes [name], MD  risperiDONE (RISPERDAL) 2 MG tablet Take 2 mg by mouth at bedtime. 01/04/20  Yes [provider]  senna-docusate (SENOKOT S) 8.6-50 MG tablet Take 1 tablet by mouth daily as needed for moderate constipation. 11/08/19  Yes [name], MD    traZODone (DESYREL) 50 MG tablet TAKE 1/2-1 TABLETS (25-50 MG TOTAL) BY MOUTH AT BEDTIME AS NEEDED FOR SLEEP. MUST MAKE APPT FOR FURTHER REFILLS 03/09/19  Yes [name], MD  Insulin NPH Isophane & Regular (RELION 70/30 [place]) Inject 35 Units into the skin 2 (two) times daily.  08/17/14  [provider]      Allergies  Allergen Reactions  . Hydralazine Hcl     Hair loss  . Hydrocodone Other (See Comments)    Upset stomach, itching  . Metformin And Related Nausea And Vomiting and Other (See Comments)    Stomach pains   . Plaquenil [Hydroxychloroquine Sulfate] Hives  . Sulfur Hives    ROS:  Out of a complete 14 system review of symptoms, the  patient complains only of the following symptoms, and all other reviewed systems are negative.  Walking difficulty, left leg weakness Chronic low back pain Numbness in the hands and feet  Blood pressure 108/76, pulse 92, temperature 97.7 F (36.5 C), height '5\' 3"'$  (1.6 m), weight 202 lb (91.6 kg).  Physical Exam  General: The patient is alert and cooperative at the time of the examination.  Affect is somewhat flat.  Eyes: Pupils are equal, round, and reactive to light. Discs are flat bilaterally.  Neck: The neck is supple, no carotid bruits are noted.  Respiratory: The respiratory examination is clear.  Cardiovascular: The cardiovascular examination reveals a regular rate and rhythm, no obvious murmurs or rubs are noted.  Skin: Extremities are with 1-2+ edema below the knees bilaterally.  Neurologic Exam  Mental status: The patient is alert and oriented x 3 at the time of the examination. The patient has apparent normal recent and remote memory, with an apparently normal attention span and concentration ability.  Cranial nerves: Facial symmetry is present. There is good sensation of the face to pinprick and soft touch bilaterally. The strength of the facial muscles and the muscles to head turning and shoulder shrug are  normal bilaterally. Speech is well enunciated, no aphasia or dysarthria is noted. Extraocular movements are full. Visual fields are full. The tongue is midline, and the patient has symmetric elevation of the soft palate. No obvious hearing deficits are noted.  Motor: The motor testing reveals 5 over 5 strength of all 4 extremities. Good symmetric motor tone is noted throughout.  Sensory: Sensory testing is intact to pinprick, soft touch, vibration sensation, and position sense on the upper extremities.  With the lower extremities there is a stocking pattern pinprick sensory deficit to just below the knees bilaterally.  Minimal impairment of vibration position sense is noted in both feet.  No evidence of extinction is noted.  Coordination: Cerebellar testing reveals good finger-nose-finger and heel-to-shin bilaterally.  Gait and station: Gait is slightly wide-based, the patient can walk without assistance but usually uses a cane.  Tandem gait is not attempted.  Romberg is negative.  The patient is able to stand from a seated position with arms crossed.  Reflexes: Deep tendon reflexes are symmetric, but are depressed bilaterally. Toes are downgoing bilaterally.   MRI brain 10/07/20:  IMPRESSION: 1. Slow or absent flow in the right cervical ICA with 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 There has also been a small remote left occipital cortex infarct. These findings are new from 2018 comparison. 2. 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  * MRI scan images were reviewed online. I agree with the written report.   Carotid doppler 11/16/20:  Summary:  Right Carotid: Occlusion of mid ICA is demonstrated.   Left Carotid: Velocities in the left ICA are consistent with a 40-59%  stenosis.        However, elevated velocities may be due to contralateral        occlusion.    2D echo 11/17/19:  IMPRESSIONS    1. Left  ventricular ejection fraction, by visual estimation, is 55 to  60%. The left ventricle has normal function. There is no left ventricular  hypertrophy.  2. Global right ventricle has normal systolic function.The right  ventricular size is normal. No increase in right ventricular wall  thickness.  3. Left atrial size was normal.  4. Right atrial size was normal.  5. The  mitral valve is normal in structure. Trace mitral valve  regurgitation. No evidence of mitral stenosis.  6. The tricuspid valve is normal in structure. Tricuspid valve  regurgitation is trivial.  7. The aortic valve is tricuspid. Aortic valve regurgitation is not  visualized. No evidence of aortic valve sclerosis or stenosis.  8. The pulmonic valve was normal in structure. Pulmonic valve  regurgitation is not visualized.  9. Normal pulmonary artery systolic pressure.  10. The inferior vena cava is normal in size with <50% respiratory  variability, suggesting right atrial pressure of 8 mmHg.     Assessment/Plan:  1.  Cerebrovascular disease, right carotid artery occlusion and history of right brain stroke  2.  Diabetes, probable diabetic neuropathy  3.  Gait disorder, recent change in walking ability  The patient reports a recent change in her ability to walk, she claims that the left leg is collapsing and she is falling.  I do not see evidence of asymmetry of strength in the lower extremities.  However, given the history of a right brain stroke event, we will recheck MRI of the brain.  The patient be set up for nerve conductions on both legs and EMG on the left leg.  She will be set up for home health physical therapy to work on leg strengthening exercises and walking stability.  She will follow-up otherwise here in 4 months.  Jill Alexanders MD 02/18/2020 4:21 PM  [facility] [address] [place], [place] [address]  Phone [phone] Fax [phone]

## 2020-02-25 ENCOUNTER — Telehealth: Payer: Self-pay | Admitting: Internal Medicine

## 2020-02-25 NOTE — Telephone Encounter (Signed)
Patient called and requested to speak with the LCSW. Please follow up at your earliest convenience.

## 2020-02-28 ENCOUNTER — Telehealth: Payer: Self-pay | Admitting: Internal Medicine

## 2020-02-28 MED ORDER — RISPERIDONE 2 MG PO TABS: 2.0000 mg | ORAL_TABLET | Freq: Every day | ORAL | 2 refills | Status: DC

## 2020-02-28 NOTE — Telephone Encounter (Signed)
-----   Message from Nancy Chesterfield, LCSW sent at 02/26/2020 10:07 AM EST ----- Regarding: Medication Refill LCSW received incoming call from patient. Pt shared anxiety about running out of her Rispirdone medication in approximately three days. States that she has not established with a psychiatrist since [facility] discharge Nov 2020.   Pt is requesting refill.   LCSW received verbal consent to complete referral to [facility] for psychiatry.

## 2020-02-29 ENCOUNTER — Telehealth: Payer: Self-pay | Admitting: Neurology

## 2020-02-29 ENCOUNTER — Encounter (HOSPITAL_COMMUNITY): Payer: Self-pay

## 2020-02-29 ENCOUNTER — Inpatient Hospital Stay (HOSPITAL_COMMUNITY)
Admission: EM | Admit: 2020-02-29 | Discharge: 2020-03-07 | DRG: 417 | Disposition: A | Discharge status: Skilled Nursing Facility | Payer: 59 | Attending: Internal Medicine | Admitting: Internal Medicine

## 2020-02-29 ENCOUNTER — Emergency Department (HOSPITAL_COMMUNITY): Payer: 59

## 2020-02-29 ENCOUNTER — Other Ambulatory Visit: Payer: Self-pay

## 2020-02-29 DIAGNOSIS — Z794 Long term (current) use of insulin: Secondary | ICD-10-CM

## 2020-02-29 DIAGNOSIS — E785 Hyperlipidemia, unspecified: Secondary | ICD-10-CM | POA: Diagnosis present

## 2020-02-29 DIAGNOSIS — Z7982 Long term (current) use of aspirin: Secondary | ICD-10-CM | POA: Diagnosis not present

## 2020-02-29 DIAGNOSIS — K3184 Gastroparesis: Secondary | ICD-10-CM | POA: Diagnosis present

## 2020-02-29 DIAGNOSIS — Z803 Family history of malignant neoplasm of breast: Secondary | ICD-10-CM

## 2020-02-29 DIAGNOSIS — N179 Acute kidney failure, unspecified: Secondary | ICD-10-CM | POA: Diagnosis not present

## 2020-02-29 DIAGNOSIS — R0600 Dyspnea, unspecified: Secondary | ICD-10-CM

## 2020-02-29 DIAGNOSIS — R2689 Other abnormalities of gait and mobility: Secondary | ICD-10-CM | POA: Diagnosis present

## 2020-02-29 DIAGNOSIS — K219 Gastro-esophageal reflux disease without esophagitis: Secondary | ICD-10-CM | POA: Diagnosis present

## 2020-02-29 DIAGNOSIS — N1831 Chronic kidney disease, stage 3a: Secondary | ICD-10-CM | POA: Diagnosis present

## 2020-02-29 DIAGNOSIS — Z87891 Personal history of nicotine dependence: Secondary | ICD-10-CM

## 2020-02-29 DIAGNOSIS — Z20822 Contact with and (suspected) exposure to covid-19: Secondary | ICD-10-CM | POA: Diagnosis present

## 2020-02-29 DIAGNOSIS — H3530 Unspecified macular degeneration: Secondary | ICD-10-CM | POA: Diagnosis present

## 2020-02-29 DIAGNOSIS — E1122 Type 2 diabetes mellitus with diabetic chronic kidney disease: Secondary | ICD-10-CM | POA: Diagnosis present

## 2020-02-29 DIAGNOSIS — Z800 Family history of malignant neoplasm of digestive organs: Secondary | ICD-10-CM

## 2020-02-29 DIAGNOSIS — E860 Dehydration: Secondary | ICD-10-CM | POA: Diagnosis present

## 2020-02-29 DIAGNOSIS — K859 Acute pancreatitis without necrosis or infection, unspecified: Secondary | ICD-10-CM | POA: Diagnosis not present

## 2020-02-29 DIAGNOSIS — I69398 Other sequelae of cerebral infarction: Secondary | ICD-10-CM

## 2020-02-29 DIAGNOSIS — Z8249 Family history of ischemic heart disease and other diseases of the circulatory system: Secondary | ICD-10-CM | POA: Diagnosis not present

## 2020-02-29 DIAGNOSIS — E1143 Type 2 diabetes mellitus with diabetic autonomic (poly)neuropathy: Secondary | ICD-10-CM | POA: Diagnosis present

## 2020-02-29 DIAGNOSIS — Z8349 Family history of other endocrine, nutritional and metabolic diseases: Secondary | ICD-10-CM | POA: Diagnosis not present

## 2020-02-29 DIAGNOSIS — K660 Peritoneal adhesions (postprocedural) (postinfection): Secondary | ICD-10-CM | POA: Diagnosis present

## 2020-02-29 DIAGNOSIS — Z885 Allergy status to narcotic agent status: Secondary | ICD-10-CM

## 2020-02-29 DIAGNOSIS — K851 Biliary acute pancreatitis without necrosis or infection: Secondary | ICD-10-CM | POA: Diagnosis not present

## 2020-02-29 DIAGNOSIS — K8689 Other specified diseases of pancreas: Secondary | ICD-10-CM

## 2020-02-29 DIAGNOSIS — H401430 Capsular glaucoma with pseudoexfoliation of lens, bilateral, stage unspecified: Secondary | ICD-10-CM | POA: Diagnosis present

## 2020-02-29 DIAGNOSIS — R1013 Epigastric pain: Secondary | ICD-10-CM | POA: Diagnosis not present

## 2020-02-29 DIAGNOSIS — Z882 Allergy status to sulfonamides status: Secondary | ICD-10-CM

## 2020-02-29 DIAGNOSIS — K8020 Calculus of gallbladder without cholecystitis without obstruction: Secondary | ICD-10-CM

## 2020-02-29 DIAGNOSIS — R1084 Generalized abdominal pain: Secondary | ICD-10-CM | POA: Diagnosis not present

## 2020-02-29 DIAGNOSIS — D72829 Elevated white blood cell count, unspecified: Secondary | ICD-10-CM

## 2020-02-29 DIAGNOSIS — L89623 Pressure ulcer of left heel, stage 3: Secondary | ICD-10-CM | POA: Diagnosis present

## 2020-02-29 DIAGNOSIS — Z833 Family history of diabetes mellitus: Secondary | ICD-10-CM

## 2020-02-29 DIAGNOSIS — R109 Unspecified abdominal pain: Secondary | ICD-10-CM

## 2020-02-29 DIAGNOSIS — D490 Neoplasm of unspecified behavior of digestive system: Secondary | ICD-10-CM | POA: Diagnosis present

## 2020-02-29 DIAGNOSIS — R112 Nausea with vomiting, unspecified: Secondary | ICD-10-CM | POA: Diagnosis present

## 2020-02-29 DIAGNOSIS — I129 Hypertensive chronic kidney disease with stage 1 through stage 4 chronic kidney disease, or unspecified chronic kidney disease: Secondary | ICD-10-CM | POA: Diagnosis present

## 2020-02-29 DIAGNOSIS — K8010 Calculus of gallbladder with chronic cholecystitis without obstruction: Secondary | ICD-10-CM | POA: Diagnosis present

## 2020-02-29 DIAGNOSIS — Z888 Allergy status to other drugs, medicaments and biological substances status: Secondary | ICD-10-CM

## 2020-02-29 DIAGNOSIS — H25013 Cortical age-related cataract, bilateral: Secondary | ICD-10-CM | POA: Diagnosis present

## 2020-02-29 DIAGNOSIS — K582 Mixed irritable bowel syndrome: Secondary | ICD-10-CM | POA: Diagnosis present

## 2020-02-29 DIAGNOSIS — E1165 Type 2 diabetes mellitus with hyperglycemia: Secondary | ICD-10-CM | POA: Diagnosis present

## 2020-02-29 LAB — COMPREHENSIVE METABOLIC PANEL
ALT: 18 U/L (ref 0–44)
AST: 19 U/L (ref 15–41)
Albumin: 4.1 g/dL (ref 3.5–5.0)
Alkaline Phosphatase: 68 U/L (ref 38–126)
Anion gap: 17 — ABNORMAL HIGH (ref 5–15)
BUN: 23 mg/dL — ABNORMAL HIGH (ref 6–20)
CO2: 22 mmol/L (ref 22–32)
Calcium: 9.6 mg/dL (ref 8.9–10.3)
Chloride: 100 mmol/L (ref 98–111)
Creatinine, Ser: 1.2 mg/dL — ABNORMAL HIGH (ref 0.44–1.00)
GFR calc Af Amer: 57 mL/min — ABNORMAL LOW (ref >60)
GFR calc non Af Amer: 49 mL/min — ABNORMAL LOW (ref >60)
Glucose, Bld: 249 mg/dL — ABNORMAL HIGH (ref 70–99)
Potassium: 4.9 mmol/L (ref 3.5–5.1)
Sodium: 139 mmol/L (ref 135–145)
Total Bilirubin: 0.9 mg/dL (ref 0.3–1.2)
Total Protein: 8.3 g/dL — ABNORMAL HIGH (ref 6.5–8.1)

## 2020-02-29 LAB — CBC
HCT: 43.7 % (ref 36.0–46.0)
HCT: 41.8 % (ref 36.0–46.0)
Hemoglobin: 13.8 g/dL (ref 12.0–15.0)
Hemoglobin: 13.4 g/dL (ref 12.0–15.0)
MCH: 28.3 pg (ref 26.0–34.0)
MCH: 29 pg (ref 26.0–34.0)
MCHC: 31.6 g/dL (ref 30.0–36.0)
MCHC: 32.1 g/dL (ref 30.0–36.0)
MCV: 89.5 fL (ref 80.0–100.0)
MCV: 90.5 fL (ref 80.0–100.0)
Platelets: 308 10*3/uL (ref 150–400)
Platelets: 290 10*3/uL (ref 150–400)
RBC: 4.88 MIL/uL (ref 3.87–5.11)
RBC: 4.62 MIL/uL (ref 3.87–5.11)
RDW: 12.2 % (ref 11.5–15.5)
RDW: 12.1 % (ref 11.5–15.5)
WBC: 12.8 10*3/uL — ABNORMAL HIGH (ref 4.0–10.5)
WBC: 13.3 10*3/uL — ABNORMAL HIGH (ref 4.0–10.5)
nRBC: 0 % (ref 0.0–0.2)
nRBC: 0 % (ref 0.0–0.2)

## 2020-02-29 LAB — LIPID PANEL
Cholesterol: 149 mg/dL (ref 0–200)
HDL: 49 mg/dL (ref >40)
LDL Cholesterol: 81 mg/dL (ref 0–99)
Total CHOL/HDL Ratio: 3 RATIO
Triglycerides: 97 mg/dL (ref <150)
VLDL: 19 mg/dL (ref 0–40)

## 2020-02-29 LAB — URINALYSIS, ROUTINE W REFLEX MICROSCOPIC
Bacteria, UA: NONE SEEN
Bilirubin Urine: NEGATIVE
Glucose, UA: >=500 mg/dL — AB
Hgb urine dipstick: NEGATIVE
Ketones, ur: 20 mg/dL — AB
Leukocytes,Ua: NEGATIVE
Nitrite: NEGATIVE
Protein, ur: 100 mg/dL — AB
Specific Gravity, Urine: 1.041 — ABNORMAL HIGH (ref 1.005–1.030)
pH: 6 (ref 5.0–8.0)

## 2020-02-29 LAB — GLUCOSE, CAPILLARY
Glucose-Capillary: 228 mg/dL — ABNORMAL HIGH (ref 70–99)
Glucose-Capillary: 240 mg/dL — ABNORMAL HIGH (ref 70–99)

## 2020-02-29 LAB — HEMOGLOBIN A1C
Hgb A1c MFr Bld: 10.8 % — ABNORMAL HIGH (ref 4.8–5.6)
Mean Plasma Glucose: 263.26 mg/dL

## 2020-02-29 LAB — LIPASE, BLOOD
Lipase: 54 U/L — ABNORMAL HIGH (ref 11–51)
Lipase: 53 U/L — ABNORMAL HIGH (ref 11–51)

## 2020-02-29 MED ORDER — RISPERIDONE 1 MG PO TABS
2.0000 mg | ORAL_TABLET | Freq: Every day | ORAL | Status: DC
  Administered 2020-02-29 – 2020-03-06 (×7): 2 mg via ORAL
  Filled 2020-02-29 (×8): qty 2

## 2020-02-29 MED ORDER — ONDANSETRON HCL 4 MG/2ML IJ SOLN
4.0000 mg | Freq: Once | INTRAMUSCULAR | Status: AC
  Administered 2020-02-29: 16:00:00 4 mg via INTRAVENOUS
  Filled 2020-02-29: qty 2

## 2020-02-29 MED ORDER — ATORVASTATIN CALCIUM 20 MG PO TABS
20.0000 mg | ORAL_TABLET | Freq: Every day | ORAL | Status: DC
  Administered 2020-03-01 – 2020-03-07 (×7): 20 mg via ORAL
  Filled 2020-02-29 (×7): qty 1

## 2020-02-29 MED ORDER — PANTOPRAZOLE SODIUM 40 MG PO TBEC
40.0000 mg | DELAYED_RELEASE_TABLET | Freq: Every day | ORAL | Status: DC
  Administered 2020-03-01: 40 mg via ORAL
  Filled 2020-02-29 (×2): qty 1

## 2020-02-29 MED ORDER — HYDROMORPHONE HCL 1 MG/ML IJ SOLN
1.0000 mg | Freq: Once | INTRAMUSCULAR | Status: AC
  Administered 2020-02-29: 18:00:00 1 mg via INTRAVENOUS
  Filled 2020-02-29: qty 1

## 2020-02-29 MED ORDER — INSULIN ASPART 100 UNIT/ML ~~LOC~~ SOLN
0.0000 [IU] | SUBCUTANEOUS | Status: DC
  Administered 2020-02-29 (×2): 3 [IU] via SUBCUTANEOUS
  Administered 2020-03-01: 2 [IU] via SUBCUTANEOUS
  Administered 2020-03-01: 1 [IU] via SUBCUTANEOUS
  Administered 2020-03-01 – 2020-03-02 (×5): 2 [IU] via SUBCUTANEOUS
  Administered 2020-03-02: 1 [IU] via SUBCUTANEOUS
  Administered 2020-03-02 (×2): 2 [IU] via SUBCUTANEOUS
  Administered 2020-03-02: 1 [IU] via SUBCUTANEOUS
  Administered 2020-03-02 – 2020-03-03 (×2): 2 [IU] via SUBCUTANEOUS
  Administered 2020-03-04: 1 [IU] via SUBCUTANEOUS
  Administered 2020-03-04 (×2): 3 [IU] via SUBCUTANEOUS
  Administered 2020-03-04 – 2020-03-05 (×2): 2 [IU] via SUBCUTANEOUS
  Administered 2020-03-05: 1 [IU] via SUBCUTANEOUS
  Administered 2020-03-05: 2 [IU] via SUBCUTANEOUS
  Administered 2020-03-05: 5 [IU] via SUBCUTANEOUS
  Administered 2020-03-06 (×2): 2 [IU] via SUBCUTANEOUS
  Administered 2020-03-06: 3 [IU] via SUBCUTANEOUS
  Administered 2020-03-06: 2 [IU] via SUBCUTANEOUS
  Filled 2020-02-29: qty 0.09

## 2020-02-29 MED ORDER — HYDROMORPHONE HCL 1 MG/ML IJ SOLN
1.0000 mg | INTRAMUSCULAR | Status: DC | PRN
  Administered 2020-03-01 – 2020-03-02 (×7): 1 mg via INTRAVENOUS
  Filled 2020-02-29 (×7): qty 1

## 2020-02-29 MED ORDER — DULOXETINE HCL 20 MG PO CPEP
20.0000 mg | ORAL_CAPSULE | Freq: Every day | ORAL | Status: DC
  Administered 2020-03-01 – 2020-03-07 (×7): 20 mg via ORAL
  Filled 2020-02-29 (×7): qty 1

## 2020-02-29 MED ORDER — BISACODYL 10 MG RE SUPP: 10.0000 mg | Freq: Every day | RECTAL | Status: DC | PRN

## 2020-02-29 MED ORDER — AMLODIPINE BESYLATE 10 MG PO TABS
10.0000 mg | ORAL_TABLET | Freq: Every day | ORAL | Status: DC
  Administered 2020-03-01 – 2020-03-07 (×7): 10 mg via ORAL
  Filled 2020-02-29 (×7): qty 1

## 2020-02-29 MED ORDER — OXYCODONE HCL 5 MG PO TABS
5.0000 mg | ORAL_TABLET | Freq: Four times a day (QID) | ORAL | Status: DC | PRN
  Administered 2020-03-02 – 2020-03-07 (×6): 5 mg via ORAL
  Filled 2020-02-29 (×8): qty 1

## 2020-02-29 MED ORDER — SENNOSIDES-DOCUSATE SODIUM 8.6-50 MG PO TABS
2.0000 | ORAL_TABLET | Freq: Two times a day (BID) | ORAL | Status: DC
  Administered 2020-02-29 – 2020-03-07 (×12): 2 via ORAL
  Filled 2020-02-29 (×15): qty 2

## 2020-02-29 MED ORDER — SODIUM CHLORIDE (PF) 0.9 % IJ SOLN
INTRAMUSCULAR | Status: AC
  Filled 2020-02-29: qty 50

## 2020-02-29 MED ORDER — ASPIRIN EC 81 MG PO TBEC
81.0000 mg | DELAYED_RELEASE_TABLET | Freq: Every day | ORAL | Status: DC
  Administered 2020-03-01 – 2020-03-07 (×7): 81 mg via ORAL
  Filled 2020-02-29 (×7): qty 1

## 2020-02-29 MED ORDER — METOPROLOL TARTRATE 5 MG/5ML IV SOLN
5.0000 mg | INTRAVENOUS | Status: DC | PRN
  Administered 2020-03-01 (×2): 5 mg via INTRAVENOUS
  Filled 2020-02-29 (×2): qty 5

## 2020-02-29 MED ORDER — ENOXAPARIN SODIUM 40 MG/0.4ML ~~LOC~~ SOLN
40.0000 mg | SUBCUTANEOUS | Status: DC
  Administered 2020-02-29 – 2020-03-06 (×7): 40 mg via SUBCUTANEOUS
  Filled 2020-02-29 (×8): qty 0.4

## 2020-02-29 MED ORDER — ONDANSETRON HCL 4 MG/2ML IJ SOLN
4.0000 mg | Freq: Four times a day (QID) | INTRAMUSCULAR | Status: DC | PRN
  Administered 2020-02-29 – 2020-03-07 (×8): 4 mg via INTRAVENOUS
  Filled 2020-02-29 (×8): qty 2

## 2020-02-29 MED ORDER — SODIUM CHLORIDE 0.9 % IV SOLN: INTRAVENOUS | Status: DC

## 2020-02-29 MED ORDER — ONDANSETRON HCL 4 MG/2ML IJ SOLN
4.0000 mg | Freq: Once | INTRAMUSCULAR | Status: AC
  Administered 2020-02-29: 14:00:00 4 mg via INTRAVENOUS
  Filled 2020-02-29: qty 2

## 2020-02-29 MED ORDER — EZETIMIBE 10 MG PO TABS
10.0000 mg | ORAL_TABLET | Freq: Every day | ORAL | Status: DC
  Administered 2020-03-01 – 2020-03-07 (×7): 10 mg via ORAL
  Filled 2020-02-29 (×7): qty 1

## 2020-02-29 MED ORDER — SODIUM CHLORIDE 0.9% FLUSH
3.0000 mL | Freq: Once | INTRAVENOUS | Status: AC
  Administered 2020-02-29: 14:00:00 3 mL via INTRAVENOUS

## 2020-02-29 MED ORDER — AMLODIPINE BESYLATE 5 MG PO TABS
5.0000 mg | ORAL_TABLET | Freq: Every day | ORAL | Status: DC
  Administered 2020-02-29: 5 mg via ORAL
  Filled 2020-02-29 (×2): qty 1

## 2020-02-29 MED ORDER — TRAZODONE HCL 50 MG PO TABS
50.0000 mg | ORAL_TABLET | Freq: Every evening | ORAL | Status: DC | PRN
  Administered 2020-02-29 – 2020-03-01 (×2): 50 mg via ORAL
  Filled 2020-02-29 (×2): qty 1

## 2020-02-29 MED ORDER — IOHEXOL 300 MG/ML  SOLN
100.0000 mL | Freq: Once | INTRAMUSCULAR | Status: AC | PRN
  Administered 2020-02-29: 100 mL via INTRAVENOUS

## 2020-02-29 MED ORDER — HYDROMORPHONE HCL 1 MG/ML IJ SOLN
1.0000 mg | Freq: Once | INTRAMUSCULAR | Status: AC
  Administered 2020-02-29: 16:00:00 1 mg via INTRAVENOUS
  Filled 2020-02-29: qty 1

## 2020-02-29 MED ORDER — SODIUM CHLORIDE 0.9 % IV BOLUS
1000.0000 mL | Freq: Once | INTRAVENOUS | Status: AC
  Administered 2020-02-29: 1000 mL via INTRAVENOUS

## 2020-02-29 MED ORDER — ACETAMINOPHEN 325 MG PO TABS
650.0000 mg | ORAL_TABLET | Freq: Four times a day (QID) | ORAL | Status: DC | PRN
  Administered 2020-03-04: 650 mg via ORAL
  Filled 2020-02-29 (×2): qty 2

## 2020-02-29 MED FILL — risperiDONE 2 MG TABS: 2 | 30 days supply | Qty: 30 | Fill #0

## 2020-02-29 NOTE — ED Triage Notes (Addendum)
Per EMS- Patient is from home. Patient reports N/v/D and bilateral abdominal pain x 24 hours. Patient states a history of gastroparesis.  Patient was given zofran 4 mg IM prior to arrival to the ED.

## 2020-02-29 NOTE — ED Notes (Signed)
Attempted to call report to 3E. Transferred to RN from Network engineer. No answer. Will attempt to call back.

## 2020-02-29 NOTE — ED Notes (Signed)
Patient assisted to BR in wheelchair to provide UA.

## 2020-02-29 NOTE — H&P (Addendum)
History and Physical  [name] EZM:[id number] DOB: 1961-01-20 DOA: 02/29/2020  Referring physician: Dr. Roderic Palau, [place]  PCP: [name], MD  Outpatient Specialists: None Patient coming from: Home  Chief Complaint: Nausea, vomiting, and abdominal pain.  HPI: Nancy [name] is a 59 y.o. female with medical history significant for essential hypertension, CVA, right carotid artery stenosis, diabetic gastroparesis, type 2 diabetes, neoplasm of parotid gland, GERD, hyperlipidemia, gait abnormality who presented to [facility] ED with complaints of worsening nausea of 2 weeks duration.  Associated with vomiting and abdominal pain.  In the past 2 days she has not been able to keep anything down.  Vomits every time she eats.  Prior to presenting to the ED vomited 6 times and while in the ED 4 times.  Denies a personal history of gallstones or pancreatitis.  No use of alcoholic beverage.  No significant weight loss.  CT scan abdomen pelvis with contrast done in the ED showed evidence of acute pancreatitis.  Lipase elevated at 54.  TRH asked to admit.  ED Course: Received IV pain medications for presumptive acute pancreatitis.  We will start IV fluids and obtain lipid panel.  Review of Systems: Review of systems as noted in the HPI. All other systems reviewed and are negative.   Past Medical History:  Diagnosis Date  . Anemia 2006  . Depression 2014   previously on amitryptiline   . Diabetes mellitus with neurological manifestation ([place]) 2006  . Fracture of left ankle 1997   . Gastroparesis 07/2016  . HOH (hard of hearing) 2004   . Hyperlipidemia 2006  . Hypertension 2006  . IBS (irritable bowel syndrome) 2002   . Leukopenia 2015   . Macular degeneration 11/2019  . Shingles 2009   . Stroke ([place]) 2020  . Thyroid nodule 2004   Past Surgical History:  Procedure Laterality Date  . ABDOMINAL HYSTERECTOMY  2005  . CATARACT EXTRACTION Left 11/2019  . CESAREAN SECTION  1983   .  ESOPHAGOGASTRODUODENOSCOPY (EGD) WITH PROPOFOL Left 08/26/2014   Procedure: ESOPHAGOGASTRODUODENOSCOPY (EGD) WITH PROPOFOL;  Surgeon: Arta Silence, MD;  Location: [facility];  Service: Endoscopy;  Laterality: Left;    Social History:  reports that she has quit smoking. Her smoking use included cigarettes. She has a 0.13 pack-year smoking history. She has never used smokeless tobacco. She reports that she does not drink alcohol or use drugs.   Allergies  Allergen Reactions  . Hydralazine Hcl     Hair loss  . Hydrocodone Other (See Comments)    Upset stomach, itching  . Metformin And Related Nausea And Vomiting and Other (See Comments)    Stomach pains   . Plaquenil [Hydroxychloroquine Sulfate] Hives  . Sulfur Hives    Family History  Problem Relation Age of Onset  . Hypertension Mother   . Heart disease Mother   . Diabetes Mother   . Thyroid disease Mother   . Congestive Heart Failure Mother   . Breast cancer Maternal Grandmother   . Colon cancer Maternal Grandfather   . Heart attack Sister   . Heart disease Brother   . Hyperlipidemia Brother   . Hypertension Brother   . Diabetes Father   . Breast cancer Maternal Aunt       Prior to Admission medications   Medication Sig Start Date End Date Taking? Authorizing Provider  acetaminophen (TYLENOL) 500 MG tablet Take 2 tablets (1,000 mg total) by mouth every 8 (eight) hours as needed for mild pain or  moderate pain. 02/03/18   Alfonse Spruce, FNP  aspirin EC 81 MG tablet Take 1 tablet (81 mg total) by mouth daily. 11/05/19   [name], MD  atorvastatin (LIPITOR) 20 MG tablet Take 1 tablet (20 mg total) by mouth daily. 02/09/20   [name], MD  Blood Glucose Monitoring Suppl (TRUE METRIX METER) w/Device KIT Use as directed 11/08/19   [name], MD  cyclobenzaprine (FLEXERIL) 5 MG tablet Take 1 tablet (5 mg total) by mouth 2 (two) times daily as needed for muscle spasms. 12/08/19   [name], MD  diclofenac sodium (VOLTAREN) 1 % GEL Apply 4 g topically 4 (four) times daily. 09/14/16   Funches, Adriana Mccallum, MD  DULoxetine (CYMBALTA) 20 MG capsule TAKE 1 CAPSULE (20 MG TOTAL) BY MOUTH DAILY. 02/12/20   [name], MD  ezetimibe (ZETIA) 10 MG tablet Take 10 mg by mouth daily. 02/12/20   [provider]  gabapentin (NEURONTIN) 300 MG capsule Take 1 capsule (300 mg total) by mouth at bedtime. 08/27/19   [name], MD  hydrochlorothiazide (HYDRODIURIL) 12.5 MG tablet Take 1 tablet (12.5 mg total) by mouth daily. 08/27/19   [name], MD  insulin aspart (NOVOLOG FLEXPEN) 100 UNIT/ML FlexPen 7 units subcut with breakfast and dinner Patient taking differently: Inject 7 Units into the skin 2 (two) times daily. 7 units subcut with breakfast and dinner 08/27/19   [name], MD  Insulin Glargine (LANTUS SOLOSTAR) 100 UNIT/ML Solostar Pen Inject 60 Units into the skin at bedtime. 02/09/20   [name], MD  Insulin Pen Needle (PEN NEEDLES) 32G X 4 MM MISC 1 each by Does not apply route 4 (four) times daily. 12/04/16   Funches, Adriana Mccallum, MD  lisinopril (ZESTRIL) 40 MG tablet TAKE 1 TABLET (40 MG TOTAL) BY MOUTH DAILY. 02/12/20   [name], MD  omeprazole (PRILOSEC) 20 MG capsule TAKE 1 CAPSULE (20 MG TOTAL) BY MOUTH DAILY. 06/24/19   [name], MD  risperiDONE (RISPERDAL) 2 MG tablet Take 1 tablet (2 mg total) by mouth at bedtime. 02/28/20   [name], MD  senna-docusate (SENOKOT S) 8.6-50 MG tablet Take 1 tablet by mouth daily as needed for moderate constipation. 11/08/19   [name], MD  traZODone (DESYREL) 50 MG tablet TAKE 1/2-1 TABLETS (25-50 MG TOTAL) BY MOUTH AT BEDTIME AS NEEDED FOR SLEEP. MUST MAKE APPT FOR FURTHER REFILLS 03/09/19   [name], MD  Insulin NPH Isophane & Regular (RELION 70/30 [employer]) Inject 35 Units into the skin 2 (two) times daily.  08/17/14  [provider]    Physical Exam: BP (!)  193/112   Pulse (!) 105   Temp 97.9 F (36.6 C)   Resp 13   Ht '5\' 3"'$  (1.6 m)   Wt 91.6 kg   SpO2 96%   BMI 35.78 kg/m   . General: 59 y.o. year-old female well developed well nourished in no acute distress.  Alert and oriented x3. . Cardiovascular: Regular rate and rhythm with no rubs or gallops.  No thyromegaly or JVD noted.  No lower extremity edema. 2/4 pulses in all 4 extremities. Marland Kitchen Respiratory: Clear to auscultation with no wheezes or rales. Good inspiratory effort. . Abdomen: Obese, tender in epigastric region.  Hypoactive bowel sounds. . Muskuloskeletal: No cyanosis, clubbing or edema noted bilaterally . Neuro: CN II-XII intact, strength, sensation, reflexes . Skin: Left heel wound, POA.  No rashes. Marland Kitchen  Psychiatry: Judgement and insight appear normal. Mood is appropriate for condition and setting          Labs on Admission:  Basic Metabolic Panel: Recent Labs  Lab 02/29/20 1315  NA 139  K 4.9  CL 100  CO2 22  GLUCOSE 249*  BUN 23*  CREATININE 1.20*  CALCIUM 9.6   Liver Function Tests: Recent Labs  Lab 02/29/20 1315  AST 19  ALT 18  ALKPHOS 68  BILITOT 0.9  PROT 8.3*  ALBUMIN 4.1   Recent Labs  Lab 02/29/20 1315 02/29/20 1407  LIPASE 54* 53*   No results for input(s): AMMONIA in the last 168 hours. CBC: Recent Labs  Lab 02/29/20 1315 02/29/20 1442  WBC 12.8* 13.3*  HGB 13.8 13.4  HCT 43.7 41.8  MCV 89.5 90.5  PLT 308 290   Cardiac Enzymes: No results for input(s): CKTOTAL, CKMB, CKMBINDEX, TROPONINI in the last 168 hours.  BNP (last 3 results) No results for input(s): BNP in the last 8760 hours.  ProBNP (last 3 results) No results for input(s): PROBNP in the last 8760 hours.  CBG: No results for input(s): GLUCAP in the last 168 hours.  Radiological Exams on Admission: CT ABDOMEN PELVIS W CONTRAST  Result Date: 02/29/2020 CLINICAL DATA:  Nausea, vomiting, gastric paresis EXAM: CT ABDOMEN AND PELVIS WITH CONTRAST TECHNIQUE:  Multidetector CT imaging of the abdomen and pelvis was performed using the standard protocol following bolus administration of intravenous contrast. CONTRAST:  154m OMNIPAQUE IOHEXOL 300 MG/ML  SOLN COMPARISON:  08/24/2014 FINDINGS: Lower chest: Lung bases are clear. No effusions. Heart is normal size. Hepatobiliary: Layering high-density material within the gallbladder, likely layering stones. Gallbladder is contracted. No focal hepatic abnormality. Pancreas: Subtle haziness noted around the pancreatic head suggesting the possibility of early acute pancreatitis. In addition, there is a subtle low-density area within the inferior pancreatic head measuring approximately 2.5 cm. No pancreatic ductal dilatation. Spleen: No focal abnormality.  Normal size. Adrenals/Urinary Tract: No adrenal abnormality. No focal renal abnormality. No stones or hydronephrosis. Urinary bladder is unremarkable. Stomach/Bowel: Normal appendix. Moderate stool burden throughout the colon. Stomach, large and small bowel grossly unremarkable. Vascular/Lymphatic: No evidence of aneurysm or adenopathy. Reproductive: Prior hysterectomy.  No adnexal masses. Other: No free fluid or free air. Musculoskeletal: No acute bony abnormality. IMPRESSION: Subtle haziness noted around the pancreatic head. This could reflect focal acute pancreatitis. Recommend clinical correlation. There is a subtle low-density area within the inferior pancreatic head. Cannot exclude mass. Recommend outpatient MRI after acute symptoms resolve for further evaluation. Gallbladder is contracted with layering high-density material, likely small stones, similar to prior study. Moderate stool throughout the colon. Electronically Signed   By: KRolm BaptiseM.D.   On: 02/29/2020 16:38    EKG: I independently viewed the EKG done and my findings are as followed: Sinus tachycardia with a rate of 102.  No specific ST-T changes.  Assessment/Plan Present on Admission: . Acute  pancreatitis  Active Problems:   Acute pancreatitis  Suspected acute gallstone pancreatitis as evidenced by CT scan CT scan showed evidence of acute pancreatitis with layering high density material within the gallbladder likely layering stones. Mildly elevated lipase level 54 LFTs normal Epigastric pain noted on exam. Repeat lipase level daily. N.p.o. Start IV fluids normal saline at 125 cc/h Start pain management with IV Dilaudid as needed for severe pain Start IV antiemetics PRN Monitor symptomatology May need MRCP in the AM if develops transaminitis and lipase starts to trend up  Leukocytosis suspect reactive in the setting of acute pancreatitis WBC 13.3K Afebrile UA negative, CT lung field negative for lobular infiltrates Obtain CBC with differential tomorrow Obtain procalcitonin and lactic acid tomorrow  Hyperlipidemia Obtain lipid panel Resume home medications  Type 2 diabetes with hyperglycemia Obtain hemoglobin A1c Start sliding scale insulin every 4 hours since n.p.o.  Uncontrolled hypertension  Likely contributed by severe abdominal pain Pain management Hold PTA HCTZ and lisinopril Start norvasc 10 mg Start IV hydralazine PRN Continue to monitor VS  Sinus tachycardia likely in the setting of dehydration from poor oral intake Started IV fluid Continue to monitor  Chronic constipation Reports no bowel movement in the last 2 weeks Start bowel regimen  Left heel wound, POA Wound care specialist for local wound care  CKD 3A Appears to be at her baseline creatinine 1.2 with GFR 57 Avoid nephrotoxins and dehydration Monitor urine output Repeat BMP in the morning  History of CVA Resume home medications  Ambulatory dysfunction post CVA Uses a cane to ambulate at baseline PT OT to assess Continue PT OT with assistance and fall precautions   DVT prophylaxis: Sq Lovenox daily   Code Status: Full code per the patient herself  Family Communication:  None at her bedside in the ED   Disposition Plan: Admit to med-surg   Consults called: None   Admission status: Inpatient status     Kayleen Memos MD [facility] Pager [phone]  If 7PM-7AM, please contact night-coverage [email or web address] Password [facility]  02/29/2020, 6:13 PM

## 2020-02-29 NOTE — Progress Notes (Signed)
RN in room at the time of call RN attempted to call for report from ED RN during shift change will attempt to call back.

## 2020-02-29 NOTE — Telephone Encounter (Signed)
LVM for pt to call back about scheduling [facility] auth: [id number] (exp 02/24/20-04/09/20).

## 2020-02-29 NOTE — ED Notes (Signed)
Pt transported to CT ?

## 2020-02-29 NOTE — ED Provider Notes (Signed)
[name]   CSN: [id number] Arrival date & time: 02/29/20  1258     History Chief Complaint  Patient presents with  . Emesis  . Diarrhea    Nancy [name] is a 59 y.o. female.  HPI 59 year old female with history of gastroparesis presents today complaining of nausea and vomiting.  She states this began yesterday.  She vomited approximately 6 times yesterday.  She denies any hematemesis or coffee-ground emesis.  She describes the pain is similar to her previous episodes of gastroparesis but has only has these episodes once every couple of years.  She denies fever, chills, frequency of urination or abdominal pain.  She does states she is having difficulty urinating here today.    Past Medical History:  Diagnosis Date  . Anemia 2006  . Depression 2014   previously on amitryptiline   . Diabetes mellitus with neurological manifestation ([place]) 2006  . Fracture of left ankle 1997   . Gastroparesis 07/2016  . HOH (hard of hearing) 2004   . Hyperlipidemia 2006  . Hypertension 2006  . IBS (irritable bowel syndrome) 2002   . Leukopenia 2015   . Macular degeneration 11/2019  . Shingles 2009   . Stroke ([place]) 2020  . Thyroid nodule 2004    Patient Active Problem List   Diagnosis Date Noted  . Cerebrovascular accident (CVA) ([place]) 11/08/2019  . Stenosis of right carotid artery 11/08/2019  . Hyperlipidemia associated with type 2 diabetes mellitus ([place]) 11/08/2019  . Cortical age-related cataract of both eyes 08/30/2019  . Gait abnormality 08/30/2019  . Statin declined 08/30/2019  . Gastroesophageal reflux disease without esophagitis 04/18/2018  . 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 Uncontrolled type 2 diabetes mellitus with diabetic neuropathy, with long-term current use of insulin ([place]) 10/29/2017  . History of macular degeneration 10/29/2017  . Chronic cervical pain 10/29/2016  . Myalgia 09/14/2016  . Insomnia 09/14/2016  . Tinea  pedis of both feet 08/20/2016  . Macular degeneration 08/17/2016  . Low back pain 09/21/2014  . Hypertension 09/21/2014  . Diabetic gastroparesis associated with type 2 diabetes mellitus ([place]) 09/14/2014  . Thyroid nodule 08/17/2014  . Obesity (BMI 30-39.9) 08/17/2014  . Hyperlipidemia 08/05/2014  . Type II diabetes mellitus with neurological manifestations, uncontrolled ([place]) 08/04/2014  . Elevated serum protein level 08/04/2014    Past Surgical History:  Procedure Laterality Date  . ABDOMINAL HYSTERECTOMY  2005  . CATARACT EXTRACTION Left 11/2019  . CESAREAN SECTION  1983   . ESOPHAGOGASTRODUODENOSCOPY (EGD) WITH PROPOFOL Left 08/26/2014   Procedure: ESOPHAGOGASTRODUODENOSCOPY (EGD) WITH PROPOFOL;  Surgeon: Arta Silence, MD;  Location: [facility];  Service: Endoscopy;  Laterality: Left;     OB History   No obstetric history on file.     Family History  Problem Relation Age of Onset  . Hypertension Mother   . Heart disease Mother   . Diabetes Mother   . Thyroid disease Mother   . Congestive Heart Failure Mother   . Breast cancer Maternal Grandmother   . Colon cancer Maternal Grandfather   . Heart attack Sister   . Heart disease Brother   . Hyperlipidemia Brother   . Hypertension Brother   . Diabetes Father   . Breast cancer Maternal Aunt     Social History   Tobacco Use  . Smoking status: Former Smoker    Packs/day: 0.25    Years: 0.50    Pack years: 0.12    Types: Cigarettes  . Smokeless  tobacco: Never Used  . Tobacco comment: quit yrs ago  Substance Use Topics  . Alcohol use: No    Comment: occasion   . Drug use: No    Home Medications Prior to Admission medications   Medication Sig Start Date End Date Taking? Authorizing Provider  acetaminophen (TYLENOL) 500 MG tablet Take 2 tablets (1,000 mg total) by mouth every 8 (eight) hours as needed for mild pain or moderate pain. 02/03/18   Alfonse Spruce, FNP  aspirin EC 81 MG tablet Take 1 tablet  (81 mg total) by mouth daily. 11/05/19   [name], MD  atorvastatin (LIPITOR) 20 MG tablet Take 1 tablet (20 mg total) by mouth daily. 02/09/20   [name], MD  Blood Glucose Monitoring Suppl (TRUE METRIX METER) w/Device KIT Use as directed 11/08/19   [name], MD  cyclobenzaprine (FLEXERIL) 5 MG tablet Take 1 tablet (5 mg total) by mouth 2 (two) times daily as needed for muscle spasms. 12/08/19   [name], MD  diclofenac sodium (VOLTAREN) 1 % GEL Apply 4 g topically 4 (four) times daily. 09/14/16   Funches, Adriana Mccallum, MD  DULoxetine (CYMBALTA) 20 MG capsule TAKE 1 CAPSULE (20 MG TOTAL) BY MOUTH DAILY. 02/12/20   [name], MD  ezetimibe (ZETIA) 10 MG tablet Take 10 mg by mouth daily. 02/12/20   [provider]  gabapentin (NEURONTIN) 300 MG capsule Take 1 capsule (300 mg total) by mouth at bedtime. 08/27/19   [name], MD  hydrochlorothiazide (HYDRODIURIL) 12.5 MG tablet Take 1 tablet (12.5 mg total) by mouth daily. 08/27/19   [name], MD  insulin aspart (NOVOLOG FLEXPEN) 100 UNIT/ML FlexPen 7 units subcut with breakfast and dinner Patient taking differently: Inject 7 Units into the skin 2 (two) times daily. 7 units subcut with breakfast and dinner 08/27/19   [name], MD  Insulin Glargine (LANTUS SOLOSTAR) 100 UNIT/ML Solostar Pen Inject 60 Units into the skin at bedtime. 02/09/20   [name], MD  Insulin Pen Needle (PEN NEEDLES) 32G X 4 MM MISC 1 each by Does not apply route 4 (four) times daily. 12/04/16   Funches, Adriana Mccallum, MD  lisinopril (ZESTRIL) 40 MG tablet TAKE 1 TABLET (40 MG TOTAL) BY MOUTH DAILY. 02/12/20   [name], MD  omeprazole (PRILOSEC) 20 MG capsule TAKE 1 CAPSULE (20 MG TOTAL) BY MOUTH DAILY. 06/24/19   [name], MD  risperiDONE (RISPERDAL) 2 MG tablet Take 1 tablet (2 mg total) by mouth at bedtime. 02/28/20   [name], MD  senna-docusate (SENOKOT S) 8.6-50 MG tablet  Take 1 tablet by mouth daily as needed for moderate constipation. 11/08/19   [name], MD  traZODone (DESYREL) 50 MG tablet TAKE 1/2-1 TABLETS (25-50 MG TOTAL) BY MOUTH AT BEDTIME AS NEEDED FOR SLEEP. MUST MAKE APPT FOR FURTHER REFILLS 03/09/19   [name], MD  Insulin NPH Isophane & Regular (RELION 70/30 [place]) Inject 35 Units into the skin 2 (two) times daily.  08/17/14  [provider]    Allergies    Hydralazine hcl, Hydrocodone, Metformin and related, Plaquenil [hydroxychloroquine sulfate], and Sulfur  Review of Systems   Review of Systems  All other systems reviewed and are negative.   Physical Exam Updated Vital Signs BP (!) 186/98   Pulse (!) 105   Temp 97.9 F (36.6 C)   Resp 18   Ht 1.6 m ('5\' 3"'$ )   Wt  91.6 kg   SpO2 100%   BMI 35.78 kg/m   Physical Exam Vitals and nursing note reviewed.  Constitutional:      General: She is not in acute distress.    Appearance: She is obese. She is not ill-appearing.  HENT:     Head: Normocephalic.     Right Ear: External ear normal.     Left Ear: External ear normal.     Nose: Nose normal.     Mouth/Throat:     Mouth: Mucous membranes are moist.  Eyes:     Extraocular Movements: Extraocular movements intact.     Pupils: Pupils are equal, round, and reactive to light.  Cardiovascular:     Rate and Rhythm: Normal rate and regular rhythm.     Pulses: Normal pulses.  Pulmonary:     Effort: Pulmonary effort is normal.  Abdominal:     General: Abdomen is flat. Bowel sounds are normal.     Palpations: Abdomen is soft.     Tenderness: There is abdominal tenderness.     Comments: Diffuse tenderness to palpation   Musculoskeletal:        General: Normal range of motion.     Cervical back: Normal range of motion.     Comments: Wound on left heel Patient states that she peeled the skin off yesterday.  Skin:    General: Skin is warm.     Capillary Refill: Capillary refill takes less than 2 seconds.    Neurological:     General: No focal deficit present.     Mental Status: She is alert and oriented to person, place, and time.  Psychiatric:        Mood and Affect: Mood normal.     ED Results / Procedures / Treatments   Labs (all labs ordered are listed, but only abnormal results are displayed) Labs Reviewed  LIPASE, BLOOD - Abnormal; Notable for the following components:      Result Value   Lipase 54 (*)    All other components within normal limits  COMPREHENSIVE METABOLIC PANEL - Abnormal; Notable for the following components:   Glucose, Bld 249 (*)    BUN 23 (*)    Creatinine, Ser 1.20 (*)    Total Protein 8.3 (*)    GFR calc non Af Amer 49 (*)    GFR calc Af Amer 57 (*)    Anion gap 17 (*)    All other components within normal limits  CBC - Abnormal; Notable for the following components:   WBC 12.8 (*)    All other components within normal limits  URINALYSIS, ROUTINE W REFLEX MICROSCOPIC  CBC  LIPASE, BLOOD    EKG EKG Interpretation  Date/Time:  Monday February 29 2020 14:26:22 EDT Ventricular Rate:  102 PR Interval:    QRS Duration: 81 QT Interval:  340 QTC Calculation: 443 R Axis:   -47 Text Interpretation: Sinus tachycardia Ventricular premature complex Abnormal R-wave progression, late transition Confirmed by Pattricia Boss [phone]) on 02/29/2020 2:53:18 PM   Radiology No results found.  Procedures Procedures (including critical care time)  Medications Ordered in ED Medications  sodium chloride 0.9 % bolus 1,000 mL (has no administration in time range)  ondansetron (ZOFRAN) injection 4 mg (has no administration in time range)  sodium chloride flush (NS) 0.9 % injection 3 mL (3 mLs Intravenous Given 02/29/20 1345)    ED Course  I have reviewed the triage vital signs and the nursing notes.  Pertinent labs &  imaging results that were available during my care of the patient were reviewed by me and considered in my medical decision making (see chart for  details).    MDM Rules/Calculators/A&P                     59 year old female history of diabetes, gastroparesis presents today complaining of nausea vomiting and also has diffuse abdominal pain on exam.  Labs obtained with hyperglycemia but no evidence of DKA and mildly elevated lipase.  Labs otherwise normal.  Mild leukocytosis noted CT pending.  Plan discussed with Dr. Roderic Palau he will follow up on results Final Clinical Impression(s) / ED Diagnoses Final diagnoses:  Nausea and vomiting, intractability of vomiting not specified, unspecified vomiting type  Generalized abdominal pain    Rx / DC Orders ED Discharge Orders    None       Pattricia Boss, MD 02/29/20 1455

## 2020-03-01 ENCOUNTER — Ambulatory Visit: Payer: 59 | Admitting: Pharmacist

## 2020-03-01 ENCOUNTER — Inpatient Hospital Stay (HOSPITAL_COMMUNITY): Payer: 59

## 2020-03-01 ENCOUNTER — Inpatient Hospital Stay: Payer: Self-pay

## 2020-03-01 LAB — CBC WITH DIFFERENTIAL/PLATELET
Abs Immature Granulocytes: 0.05 10*3/uL (ref 0.00–0.07)
Basophils Absolute: 0 10*3/uL (ref 0.0–0.1)
Basophils Relative: 0 %
Eosinophils Absolute: 0 10*3/uL (ref 0.0–0.5)
Eosinophils Relative: 0 %
HCT: 35.5 % — ABNORMAL LOW (ref 36.0–46.0)
Hemoglobin: 11.5 g/dL — ABNORMAL LOW (ref 12.0–15.0)
Immature Granulocytes: 0 %
Lymphocytes Relative: 14 %
Lymphs Abs: 1.8 10*3/uL (ref 0.7–4.0)
MCH: 29 pg (ref 26.0–34.0)
MCHC: 32.4 g/dL (ref 30.0–36.0)
MCV: 89.4 fL (ref 80.0–100.0)
Monocytes Absolute: 0.9 10*3/uL (ref 0.1–1.0)
Monocytes Relative: 6 %
Neutro Abs: 10.8 10*3/uL — ABNORMAL HIGH (ref 1.7–7.7)
Neutrophils Relative %: 80 %
Platelets: 254 10*3/uL (ref 150–400)
RBC: 3.97 MIL/uL (ref 3.87–5.11)
RDW: 12.5 % (ref 11.5–15.5)
WBC: 13.5 10*3/uL — ABNORMAL HIGH (ref 4.0–10.5)
nRBC: 0 % (ref 0.0–0.2)

## 2020-03-01 LAB — GLUCOSE, CAPILLARY
Glucose-Capillary: 144 mg/dL — ABNORMAL HIGH (ref 70–99)
Glucose-Capillary: 156 mg/dL — ABNORMAL HIGH (ref 70–99)
Glucose-Capillary: 166 mg/dL — ABNORMAL HIGH (ref 70–99)
Glucose-Capillary: 167 mg/dL — ABNORMAL HIGH (ref 70–99)
Glucose-Capillary: 172 mg/dL — ABNORMAL HIGH (ref 70–99)
Glucose-Capillary: 177 mg/dL — ABNORMAL HIGH (ref 70–99)

## 2020-03-01 LAB — TROPONIN I (HIGH SENSITIVITY): Troponin I (High Sensitivity): 6 ng/L (ref <18)

## 2020-03-01 LAB — COMPREHENSIVE METABOLIC PANEL
ALT: 14 U/L (ref 0–44)
AST: 11 U/L — ABNORMAL LOW (ref 15–41)
Albumin: 3.4 g/dL — ABNORMAL LOW (ref 3.5–5.0)
Alkaline Phosphatase: 57 U/L (ref 38–126)
Anion gap: 10 (ref 5–15)
BUN: 27 mg/dL — ABNORMAL HIGH (ref 6–20)
CO2: 23 mmol/L (ref 22–32)
Calcium: 8.9 mg/dL (ref 8.9–10.3)
Chloride: 107 mmol/L (ref 98–111)
Creatinine, Ser: 1.31 mg/dL — ABNORMAL HIGH (ref 0.44–1.00)
GFR calc Af Amer: 52 mL/min — ABNORMAL LOW (ref >60)
GFR calc non Af Amer: 44 mL/min — ABNORMAL LOW (ref >60)
Glucose, Bld: 178 mg/dL — ABNORMAL HIGH (ref 70–99)
Potassium: 4.4 mmol/L (ref 3.5–5.1)
Sodium: 140 mmol/L (ref 135–145)
Total Bilirubin: 0.5 mg/dL (ref 0.3–1.2)
Total Protein: 7.4 g/dL (ref 6.5–8.1)

## 2020-03-01 LAB — BLOOD GAS, ARTERIAL
Acid-base deficit: 0.7 mmol/L (ref 0.0–2.0)
Bicarbonate: 23.9 mmol/L (ref 20.0–28.0)
Drawn by: 461261
FIO2: 21
O2 Saturation: 96 %
Patient temperature: 98.6
pCO2 arterial: 41.1 mmHg (ref 32.0–48.0)
pH, Arterial: 7.381 (ref 7.350–7.450)
pO2, Arterial: 79.9 mmHg — ABNORMAL LOW (ref 83.0–108.0)

## 2020-03-01 LAB — LACTIC ACID, PLASMA: Lactic Acid, Venous: 0.8 mmol/L (ref 0.5–1.9)

## 2020-03-01 LAB — PROCALCITONIN: Procalcitonin: <0.1 ng/mL

## 2020-03-01 LAB — LIPASE, BLOOD: Lipase: 33 U/L (ref 11–51)

## 2020-03-01 MED ORDER — METOPROLOL TARTRATE 12.5 MG HALF TABLET
12.5000 mg | ORAL_TABLET | Freq: Two times a day (BID) | ORAL | Status: DC
  Administered 2020-03-01 – 2020-03-04 (×6): 12.5 mg via ORAL
  Filled 2020-03-01 (×7): qty 1

## 2020-03-01 MED ORDER — GADOBUTROL 1 MMOL/ML IV SOLN
10.0000 mL | Freq: Once | INTRAVENOUS | Status: AC | PRN
  Administered 2020-03-01: 10 mL via INTRAVENOUS

## 2020-03-01 MED ORDER — PROCHLORPERAZINE EDISYLATE 10 MG/2ML IJ SOLN
10.0000 mg | INTRAMUSCULAR | Status: AC | PRN
  Administered 2020-03-01 (×2): 10 mg via INTRAVENOUS
  Filled 2020-03-01 (×2): qty 2

## 2020-03-01 MED ORDER — SODIUM CHLORIDE 0.9% FLUSH
10.0000 mL | INTRAVENOUS | Status: DC | PRN
  Administered 2020-03-04: 20 mL

## 2020-03-01 MED ORDER — MORPHINE SULFATE (PF) 2 MG/ML IV SOLN
1.0000 mg | Freq: Once | INTRAVENOUS | Status: AC
  Administered 2020-03-01: 1 mg via INTRAVENOUS
  Filled 2020-03-01: qty 1

## 2020-03-01 MED ORDER — CHLORHEXIDINE GLUCONATE CLOTH 2 % EX PADS
6.0000 | MEDICATED_PAD | Freq: Every day | CUTANEOUS | Status: DC
  Administered 2020-03-01 – 2020-03-07 (×6): 6 via TOPICAL

## 2020-03-01 NOTE — Progress Notes (Signed)
PT Cancellation Note  Patient Details Name: Nancy [name] MRN: [id number] DOB: [birth year]   Cancelled Treatment:    Reason Eval/Treat Not Completed: Medical issues which prohibited therapy;Pain limiting ability to participate  Rollen Sox, PT # [phone] CGV cell  Casandra Doffing 03/01/2020, 12:56 PM

## 2020-03-01 NOTE — Progress Notes (Signed)
PROGRESS NOTE  Nancy [name] NWG:[id number] DOB: 1961/06/22 DOA: 02/29/2020 PCP: [name], MD  HPI/Recap of past 24 hours:  Nancy [name] is a 59 y.o. female with medical history significant for essential hypertension, CVA, right carotid artery stenosis, diabetic gastroparesis, type 2 diabetes, neoplasm of parotid gland, GERD, hyperlipidemia, gait abnormality who presented to [facility] ED with complaints of worsening nausea of 2 weeks duration.  Associated with vomiting and abdominal pain.  In the past 2 days she has not been able to keep anything down.  Vomits every time she eats.  Prior to presenting to the ED vomited 6 times and while in the ED 4 times.  Denies a personal history of gallstones or pancreatitis.  No use of alcoholic beverage.  No significant weight loss.  CT scan abdomen pelvis with contrast done in the ED showed evidence of acute pancreatitis.  Lipase elevated at 54.  TRH asked to admit.  03/01/20: Seen and examined.  Reports epigastric pain which is persistent. Later this morning with complaints of dyspnea without hypoxia.  Vital signs and labs reviewed and are stable.     Assessment/Plan: Active Problems:   Acute pancreatitis   Possible mass within the inferior pancreatic head versus focal acute pancreatitis CT scan independently reviewed.  Noted possible mass-affecting the head of the pancreas. Lipase level has normalized LFTs are normal Triglycerides normal Will obtain an MRI to further assess due to persistent symptomatology Resume p.o., clear liquid diet.  Will advance as tolerated.  Leukocytosis suspect reactive in the setting of intraabdominal pathology  WBC 13.3K>> 13.5K Afebrile UA negative, CT lung field negative for lobular infiltrates Leukocytosis and neutrophilia present on CBC with differentials Procalcitonin and lactic acid negative. No clear sources of active infective process.  Hyperlipidemia Triglycerides normal Continue home  medications  Type 2 diabetes with hyperglycemia Hemoglobin A1c 10.8 on 02/29/2020. Start clear liquid diet.  Uncontrolled hypertension  Likely contributed by severe abdominal pain Pain management Hold PTA HCTZ and lisinopril Continue norvasc 10 mg Continue IV hydralazine PRN Continue to monitor VS  Sinus tachycardia likely in the setting of dehydration from poor oral intake Continue IV fluid, reduce rate to 75 cc/h. Continue to monitor  Chronic constipation Reports no bowel movement in the last 2 weeks Continue bowel regimen Start clear liquid diet and advance as tolerated  Left heel wound, POA Wound care specialist for local wound care Continue local wound care  CKD 3A Appears to be at her baseline GFR 52 Avoid nephrotoxins and dehydration Monitor urine output  History of CVA Continue home medications  Ambulatory dysfunction post CVA Uses a cane to ambulate at baseline PT OT to assess Continue PT OT with assistance and fall precautions   DVT prophylaxis: Sq Lovenox daily   Code Status: Full code per the patient herself  Family Communication: None at her bedside in the ED   Disposition Plan:  Patient is from home.  Anticipate discharge to home in the next 24 to 48 hours.  Barrier to discharge ongoing work-up for possible mass affecting the head of the pancreas.  Consults called: None     Objective: Vitals:   03/01/20 0922 03/01/20 1337 03/01/20 1359 03/01/20 1448  BP: (!) 175/94 (!) 157/104 (!) 162/98 (!) 171/90  Pulse: 98 (!) 102 91 83  Resp:  18    Temp:  98.5 F (36.9 C)    TempSrc:  Oral    SpO2: 100% 100%    Weight:  Height:        Intake/Output Summary (Last 24 hours) at 03/01/2020 1547 Last data filed at 03/01/2020 1400 Gross per 24 hour  Intake 2318.18 ml  Output 950 ml  Net 1368.18 ml   Filed Weights   02/29/20 1304 02/29/20 1954  Weight: 91.6 kg 91.6 kg    Exam:  . General: 59 y.o. year-old female well  developed well nourished in no acute distress.  Alert and oriented x3. . Cardiovascular: Regular rate and rhythm with no rubs or gallops.  No thyromegaly or JVD noted.   Marland Kitchen Respiratory: Clear to auscultation with no wheezes or rales. Good inspiratory effort. . Abdomen: Epigastric tenderness on palpation.  Hypoactive bowel sounds present. . Musculoskeletal: No lower extremity edema. 2/4 pulses in all 4 extremities. Marland Kitchen Psychiatry: Mood is appropriate for condition and setting   Data Reviewed: CBC: Recent Labs  Lab 02/29/20 1315 02/29/20 1442 03/01/20 1010  WBC 12.8* 13.3* 13.5*  NEUTROABS  --   --  10.8*  HGB 13.8 13.4 11.5*  HCT 43.7 41.8 35.5*  MCV 89.5 90.5 89.4  PLT 308 290 629   Basic Metabolic Panel: Recent Labs  Lab 02/29/20 1315 03/01/20 1010  NA 139 140  K 4.9 4.4  CL 100 107  CO2 22 23  GLUCOSE 249* 178*  BUN 23* 27*  CREATININE 1.20* 1.31*  CALCIUM 9.6 8.9   GFR: Estimated Creatinine Clearance: 49.7 mL/min (A) (by C-G formula based on SCr of 1.31 mg/dL (H)). Liver Function Tests: Recent Labs  Lab 02/29/20 1315 03/01/20 1010  AST 19 11*  ALT 18 14  ALKPHOS 68 57  BILITOT 0.9 0.5  PROT 8.3* 7.4  ALBUMIN 4.1 3.4*   Recent Labs  Lab 02/29/20 1315 02/29/20 1407 03/01/20 1010  LIPASE 54* 53* 33   No results for input(s): AMMONIA in the last 168 hours. Coagulation Profile: No results for input(s): INR, PROTIME in the last 168 hours. Cardiac Enzymes: No results for input(s): CKTOTAL, CKMB, CKMBINDEX, TROPONINI in the last 168 hours. BNP (last 3 results) No results for input(s): PROBNP in the last 8760 hours. HbA1C: Recent Labs    02/29/20 1442  HGBA1C 10.8*   CBG: Recent Labs  Lab 02/29/20 2018 02/29/20 2348 03/01/20 0423 03/01/20 0740 03/01/20 1153  GLUCAP 240* 228* 166* 177* 167*   Lipid Profile: Recent Labs    02/29/20 1315  CHOL 149  HDL 49  LDLCALC 81  TRIG 97  CHOLHDL 3.0   Thyroid Function Tests: No results for input(s):  TSH, T4TOTAL, FREET4, T3FREE, THYROIDAB in the last 72 hours. Anemia Panel: No results for input(s): VITAMINB12, FOLATE, FERRITIN, TIBC, IRON, RETICCTPCT in the last 72 hours. Urine analysis:    Component Value Date/Time   COLORURINE YELLOW 02/29/2020 1315   APPEARANCEUR CLEAR 02/29/2020 1315   LABSPEC 1.041 (H) 02/29/2020 1315   PHURINE 6.0 02/29/2020 1315   GLUCOSEU >=500 (A) 02/29/2020 1315   HGBUR NEGATIVE 02/29/2020 1315   BILIRUBINUR NEGATIVE 02/29/2020 1315   BILIRUBINUR negative 12/26/2017 1458   KETONESUR 20 (A) 02/29/2020 1315   PROTEINUR 100 (A) 02/29/2020 1315   UROBILINOGEN 0.2 12/26/2017 1458   UROBILINOGEN 0.2 09/07/2014 0857   NITRITE NEGATIVE 02/29/2020 1315   LEUKOCYTESUR NEGATIVE 02/29/2020 1315   Sepsis Labs: @LABRCNTIP (procalcitonin:4,lacticidven:4)  )No results found for this or any previous visit (from the past 240 hour(s)).    Studies: CT ABDOMEN PELVIS W CONTRAST  Result Date: 02/29/2020 CLINICAL DATA:  Nausea, vomiting, gastric paresis EXAM: CT ABDOMEN AND  PELVIS WITH CONTRAST TECHNIQUE: Multidetector CT imaging of the abdomen and pelvis was performed using the standard protocol following bolus administration of intravenous contrast. CONTRAST:  129mL OMNIPAQUE IOHEXOL 300 MG/ML  SOLN COMPARISON:  08/24/2014 FINDINGS: Lower chest: Lung bases are clear. No effusions. Heart is normal size. Hepatobiliary: Layering high-density material within the gallbladder, likely layering stones. Gallbladder is contracted. No focal hepatic abnormality. Pancreas: Subtle haziness noted around the pancreatic head suggesting the possibility of early acute pancreatitis. In addition, there is a subtle low-density area within the inferior pancreatic head measuring approximately 2.5 cm. No pancreatic ductal dilatation. Spleen: No focal abnormality.  Normal size. Adrenals/Urinary Tract: No adrenal abnormality. No focal renal abnormality. No stones or hydronephrosis. Urinary bladder is  unremarkable. Stomach/Bowel: Normal appendix. Moderate stool burden throughout the colon. Stomach, large and small bowel grossly unremarkable. Vascular/Lymphatic: No evidence of aneurysm or adenopathy. Reproductive: Prior hysterectomy.  No adnexal masses. Other: No free fluid or free air. Musculoskeletal: No acute bony abnormality. IMPRESSION: Subtle haziness noted around the pancreatic head. This could reflect focal acute pancreatitis. Recommend clinical correlation. There is a subtle low-density area within the inferior pancreatic head. Cannot exclude mass. Recommend outpatient MRI after acute symptoms resolve for further evaluation. Gallbladder is contracted with layering high-density material, likely small stones, similar to prior study. Moderate stool throughout the colon. Electronically Signed   By: Rolm Baptise M.D.   On: 02/29/2020 16:38   DG CHEST PORT 1 VIEW  Result Date: 03/01/2020 CLINICAL DATA:  Dyspnea EXAM: PORTABLE CHEST 1 VIEW COMPARISON:  10/09/2019 FINDINGS: Cardiomediastinal contours accentuated by portable technique potentially mildly enlarged. Hilar structures are normal. Lungs are clear. No acute bone findings. Areas of subtle opacity project over left cardiac border and to the right of cardiac border likely external to the patient. IMPRESSION: No acute cardiopulmonary disease.  Mild cardiomegaly. Electronically Signed   By: Zetta Bills M.D.   On: 03/01/2020 10:23   [place] EKG SITE RITE  Result Date: 03/01/2020 If Site Rite image not attached, placement could not be confirmed due to current cardiac rhythm.   Scheduled Meds: . amLODipine  10 mg Oral Daily  . aspirin EC  81 mg Oral Daily  . atorvastatin  20 mg Oral Daily  . Chlorhexidine Gluconate Cloth  6 each Topical Daily  . DULoxetine  20 mg Oral Daily  . enoxaparin (LOVENOX) injection  40 mg Subcutaneous Q24H  . ezetimibe  10 mg Oral Daily  . insulin aspart  0-9 Units Subcutaneous Q4H  . pantoprazole  40 mg Oral Daily  .  risperiDONE  2 mg Oral QHS  . senna-docusate  2 tablet Oral BID    Continuous Infusions: . sodium chloride 125 mL/hr at 03/01/20 1219     LOS: 1 day     Kayleen Memos, MD [facility] Pager [phone]  If 7PM-7AM, please contact night-coverage [email or web address] Password [facility] 03/01/2020, 3:47 PM

## 2020-03-01 NOTE — Progress Notes (Signed)
Peripherally Inserted Central Catheter/Midline Placement  The IV Nurse has discussed with the patient and/or persons authorized to consent for the patient, the purpose of this procedure and the potential benefits and risks involved with this procedure.  The benefits include less needle sticks, lab draws from the catheter, and the patient may be discharged home with the catheter. Risks include, but not limited to, infection, bleeding, blood clot (thrombus formation), and puncture of an artery; nerve damage and irregular heartbeat and possibility to perform a PICC exchange if needed/ordered by physician.  Alternatives to this procedure were also discussed.  Bard Power PICC patient education guide, fact sheet on infection prevention and patient information card has been provided to patient /or left at bedside.    PICC/Midline Placement Documentation  PICC Double Lumen 15/72/62 PICC Right Basilic 39 cm 0 cm (Active)  Indication for Insertion or Continuance of Line Poor Vasculature-patient has had multiple peripheral attempts or PIVs lasting less than 24 hours 03/01/20 1110  Exposed Catheter (cm) 0 cm 03/01/20 1110  Site Assessment Clean;Dry;Intact 03/01/20 1110  Lumen #1 Status Flushed;Blood return noted 03/01/20 1110  Lumen #2 Status Flushed;Blood return noted 03/01/20 1110  Dressing Type Transparent 03/01/20 1110  Dressing Status Clean;Antimicrobial disc in place;Intact;Other (Comment) 03/01/20 1110  Dressing Intervention New dressing 03/01/20 1110  Dressing Change Due 03/08/20 03/01/20 1110       Christella Noa Albarece 03/01/2020, 11:11 AM

## 2020-03-01 NOTE — Consult Note (Signed)
[facility] Nurse Consult Note: Reason for Consult: Consult requested for left heel. Wound type: Chronic healing stage 3 pressure injury Pressure Injury POA: Yes Measurement: 5X5X.1cm Wound bed: pink and dry, surrounded by loose peeling skin Drainage (amount, consistency, odor) no odor, drainage or fluctuance Periwound: pink dry scar tissue surrounding Dressing procedure/placement/frequency: Foam dressing to protect from further injury and promote healing.  Float heel to reduce pressure. Please re-consult if further assistance is needed.  Thank-you,  Julien Girt MSN, [place], [place], [place], [place]

## 2020-03-01 NOTE — Progress Notes (Signed)
OT Cancellation Note  Patient Details Name: Nancy [name] MRN: [id number] DOB: 1961/02/21   Cancelled Treatment:    Reason Eval/Treat Not Completed: Medical issues which prohibited therapy;Pain limiting ability to participate. Plan to reattempt at a later time/date.  Tyrone Schimke, OT Acute Rehabilitation Services Pager: [phone] Office: [phone]  03/01/2020, 1:04 PM

## 2020-03-01 NOTE — Progress Notes (Signed)
Notified [facility] to come and assess patient as she was saying she could not breathe and her chest hurt. MD was notified, orders given.

## 2020-03-02 ENCOUNTER — Telehealth: Payer: Self-pay | Admitting: Licensed Clinical Social Worker

## 2020-03-02 LAB — GLUCOSE, CAPILLARY
Glucose-Capillary: 131 mg/dL — ABNORMAL HIGH (ref 70–99)
Glucose-Capillary: 159 mg/dL — ABNORMAL HIGH (ref 70–99)
Glucose-Capillary: 143 mg/dL — ABNORMAL HIGH (ref 70–99)
Glucose-Capillary: 161 mg/dL — ABNORMAL HIGH (ref 70–99)
Glucose-Capillary: 187 mg/dL — ABNORMAL HIGH (ref 70–99)
Glucose-Capillary: 200 mg/dL — ABNORMAL HIGH (ref 70–99)
Glucose-Capillary: 160 mg/dL — ABNORMAL HIGH (ref 70–99)

## 2020-03-02 LAB — LIPASE, BLOOD: Lipase: 21 U/L (ref 11–51)

## 2020-03-02 MED ORDER — LIDOCAINE VISCOUS HCL 2 % MT SOLN
15.0000 mL | Freq: Once | OROMUCOSAL | Status: AC
  Administered 2020-03-02: 11:00:00 15 mL via ORAL
  Filled 2020-03-02: qty 15

## 2020-03-02 MED ORDER — PANTOPRAZOLE SODIUM 40 MG IV SOLR
40.0000 mg | Freq: Two times a day (BID) | INTRAVENOUS | Status: DC
  Administered 2020-03-02 – 2020-03-04 (×5): 40 mg via INTRAVENOUS
  Filled 2020-03-02 (×5): qty 40

## 2020-03-02 MED ORDER — ALUM & MAG HYDROXIDE-SIMETH 200-200-20 MG/5ML PO SUSP
30.0000 mL | Freq: Once | ORAL | Status: AC
  Administered 2020-03-02: 30 mL via ORAL
  Filled 2020-03-02: qty 30

## 2020-03-02 NOTE — Evaluation (Signed)
Occupational Therapy Evaluation Patient Details Name: Nancy [name] MRN: [id number] DOB: 07-02-1961 Today's Date: 03/02/2020    History of Present Illness 59 year old female admitted with N/V. ? of acute pancreatitis. PMH includes essential HTN, CVA, Right carotid artery stenosis. Diabetic gastroparesis, neoplasm of partotid gland. Has cataracts (pending surgery)   Clinical Impression   Pt admitted with the above diagnoses and presents with below problem list. Pt will benefit from continued acute OT to address the below listed deficits and maximize independence with basic ADLs prior to d/c to venue below. PTA pt reports she used a cane for ambulation in the home, assist needed at times with bathing/dressing tasks. Pt is currently min to mod A with UB/LB ADLs, min A with pivotal transfers. Of note, pt appeared to become lightheaded while pivoting to recliner, noted to immediately close eyes once sitting down but easily arousable to voice. Pt placed in reclined position in chair and vitals assessed: bp 113/74, HR 80, O2 on RA 99. Nursing notified.  Pt lives with spouse who appears to be her sole caregiver. Spouse works from 3-11pm per pt report. If spouse is able to arrange at least initial 24/7 supervision/assist at time of d/c then she should be fine to d/c straight home. If not, may need to consider short-term stay at SNF for further rehab prior to returning home.      Follow Up Recommendations  Supervision/Assistance - 24 hour;SNF    Equipment Recommendations  3 in 1 bedside commode    Recommendations for Other Services PT consult     Precautions / Restrictions Precautions Precautions: Fall Restrictions Weight Bearing Restrictions: No      Mobility Bed Mobility Overal bed mobility: Needs Assistance Bed Mobility: Supine to Sit     Supine to sit: Min assist;HOB elevated     General bed mobility comments: Pt able to advance BLE off EOB, assist to steady while coming powerin  up trunk and coming to full EOB position.  Transfers Overall transfer level: Needs assistance Equipment used: Rolling walker (2 wheeled);Straight cane Transfers: Sit to/from [facility] Sit to Stand: Min guard;Min assist;From elevated surface Stand pivot transfers: Min guard;Min assist;From elevated surface       General transfer comment: Trialed rw on initial stand then pt requesting to trial with cane. Min guard to light min A to pivot to chair with pt appearing to be + lightheaded with OOB activity.     Balance Overall balance assessment: Needs assistance Sitting-balance support: Feet supported;No upper extremity supported Sitting balance-Leahy Scale: Fair     Standing balance support: Single extremity supported Standing balance-Leahy Scale: Poor Standing balance comment: cane and close min guard to min A to steady while pivoting to recliner                           ADL either performed or assessed with clinical judgement   ADL Overall ADL's : Needs assistance/impaired Eating/Feeding: Set up;Sitting   Grooming: Minimal assistance;Sitting   Upper Body Bathing: Moderate assistance;Sitting   Lower Body Bathing: Moderate assistance;Sit to/from stand   Upper Body Dressing : Moderate assistance;Sitting   Lower Body Dressing: Moderate assistance;Sit to/from stand   Toilet Transfer: Minimal assistance;Stand-pivot;RW;BSC;Moderate assistance   Toileting- Clothing Manipulation and Hygiene: Moderate assistance;Sit to/from stand         General ADL Comments: Pt completed bed mobility, sat EOB a couple of minutes then pivotal steps to sit up in recliner. Pt  r/o "feeling so tired" while transfering to recliner. Pt appearing lightheaded and noted to close eyes once sitting in recliner. Easily arousable. Vitals assessed, bp 113/74.     Vision         Perception     Praxis      Pertinent Vitals/Pain Pain Assessment: Faces Faces Pain Scale: Hurts  little more Pain Location: abdomen Pain Descriptors / Indicators: Guarding Pain Intervention(s): Monitored during session;Limited activity within patient's tolerance;Repositioned     Hand Dominance     Extremity/Trunk Assessment Upper Extremity Assessment Upper Extremity Assessment: Generalized weakness   Lower Extremity Assessment Lower Extremity Assessment: Defer to PT evaluation       Communication Communication Communication: Expressive difficulties(delayed, slow verbal responses at times)   Cognition Arousal/Alertness: Awake/alert Behavior During Therapy: Flat affect;WFL for tasks assessed/performed Overall Cognitive Status: No family/caregiver present to determine baseline cognitive functioning                                 General Comments: A&O x3. delayed responses at times. slow processing. difficulty sequencing   General Comments       Exercises     Shoulder Instructions      Home Living Family/patient expects to be discharged to:: Private residence Living Arrangements: Spouse/significant other Available Help at Discharge: Family;Other (Comment)(spouse works 3pm-11pm) Type of Home: Apartment Home Access: Stairs to enter Technical brewer of Steps: 3 Entrance Stairs-Rails: None Home Layout: One level     Bathroom Shower/Tub: Tub/shower unit         Home Equipment: Marine scientist - single point          Prior Functioning/Environment Level of Independence: Needs assistance  Gait / Transfers Assistance Needed: cane at baseline. mostly limited to household distances but does walk to car with spouse once a week for appointments ADL's / Homemaking Assistance Needed: spouse assist with bathing/dressing as needed depending on how pt is feeling per her report            OT Problem List: Decreased strength;Decreased activity tolerance;Impaired balance (sitting and/or standing);Decreased cognition;Decreased knowledge of use of DME  or AE;Decreased knowledge of precautions;Pain      OT Treatment/Interventions: Self-care/ADL training;Therapeutic exercise;Energy conservation;DME and/or AE instruction;Therapeutic activities;Patient/family education;Balance training    OT Goals(Current goals can be found in the care plan section) Acute Rehab OT Goals Patient Stated Goal: feel better, find out what's going on OT Goal Formulation: With patient Time For Goal Achievement: 03/16/20 Potential to Achieve Goals: Good ADL Goals Pt Will Perform Upper Body Bathing: with supervision;sitting Pt Will Perform Lower Body Bathing: with min guard assist;sit to/from stand Pt Will Perform Upper Body Dressing: with supervision;sitting Pt Will Perform Lower Body Dressing: with min guard assist;sit to/from stand Pt Will Transfer to Toilet: with modified independence;ambulating Pt Will Perform Toileting - Clothing Manipulation and hygiene: with modified independence;sit to/from stand Pt Will Perform Tub/Shower Transfer: with min guard assist;Tub transfer;shower seat;ambulating  OT Frequency: Min 2X/week   Barriers to D/C: Decreased caregiver support  spouse works 3-11pm.        Co-evaluation              AM-PAC OT "6 Clicks" Daily Activity     Outcome Measure Help from another person eating meals?: None Help from another person taking care of personal grooming?: A Little Help from another person toileting, which includes using toliet, bedpan, or urinal?: A Lot Help from another person  bathing (including washing, rinsing, drying)?: A Lot Help from another person to put on and taking off regular upper body clothing?: A Little Help from another person to put on and taking off regular lower body clothing?: A Lot 6 Click Score: 16   End of Session Equipment Utilized During Treatment: Rolling walker;Other (comment)(cane) Nurse Communication: Mobility status;Other (comment);Precautions(+ lightheaded, bp 113/74 once in chair)  Activity  Tolerance: Other (comment);Patient limited by fatigue;Patient limited by pain(+ lightheaded in standing) Patient left: in chair;with call bell/phone within reach;with chair alarm set(reclined, feet elevated with pillows supporting BLE)  OT Visit Diagnosis: Unsteadiness on feet (R26.81);Other abnormalities of gait and mobility (R26.89);Muscle weakness (generalized) (M62.81);Other symptoms and signs involving cognitive function;Pain;Cognitive communication deficit (R41.841)                Time: 6160-7371 OT Time Calculation (min): 29 min Charges:  OT General Charges $OT Visit: 1 Visit OT Evaluation $OT Eval Moderate Complexity: 1 Mod OT Treatments $Self Care/Home Management : 8-22 mins  Tyrone Schimke, OT Acute Rehabilitation Services Pager: [phone] Office: [phone]   Hortencia Pilar 03/02/2020, 2:14 PM

## 2020-03-02 NOTE — Progress Notes (Signed)
PROGRESS NOTE  Nancy [name] WVP:[id number] DOB: [birth year] DOA: 02/29/2020 PCP: [name], MD  HPI/Recap of past 24 hours:  Nancy [name] is a 59 y.o. female with medical history significant for essential hypertension, CVA, right carotid artery stenosis, diabetic gastroparesis, type 2 diabetes, neoplasm of parotid gland, GERD, hyperlipidemia, gait abnormality who presented to [facility] ED with complaints of worsening nausea of 2 weeks duration.  Associated with vomiting and abdominal pain.  In the past 2 days she has not been able to keep anything down.  Vomits every time she eats.  Prior to presenting to the ED vomited 6 times and while in the ED 4 times.  Denies a personal history of gallstones or pancreatitis.  No use of alcoholic beverage.  No significant weight loss.  CT scan abdomen pelvis with contrast done in the ED showed evidence of acute pancreatitis.   TRH asked to admit.  Initial Lipase elevated at 54, normalized.  MRI abdomen negative for mass but showing acute pancreatitis.  Lab work unremarkable despite persistent symptomatology.  Last EGD 2015 by Dr. Paulita Fujita was normal.  [facility] consulted to assist with the management.  03/02/20: Seen and examined.  Reports persistent pain and nausea.  Unclear etiology.  Started IV Protonix 40 mg BID and GI cocktail.   Assessment/Plan: Active Problems:   Acute pancreatitis   Focal acute pancreatitis on CT and MRI CT scan independently reviewed.  Noted possible mass-affecting the head of the pancreas. MRI showing acute pancreatittis Negative lipase LFTs normal Tbili, triglycerides normal Lactic acid and procalcitonin negative Last EGD normal 2015 Continue as tolerated clear liquid diet Continue gentle IV fluid hydration due to poor oral intake Continue to complaint of epigastric pain and nausea Continue symptoms management [facility] consulted to further assess, Dr. Paulita Fujita.  Leukocytosis suspect reactive in the setting  of intraabdominal pathology  WBC 13.3K>> 13.5K Afebrile UA negative, CT lung field negative for lobular infiltrates Leukocytosis and neutrophilia present on CBC with differentials Procalcitonin and lactic acid negative. No clear sources of active infective process.  Hyperlipidemia Triglycerides normal Continue home medications  Type 2 diabetes with hyperglycemia Hemoglobin A1c 10.8 on 02/29/2020. Continue as tolerated clear liquid diet.  Resolved uncontrolled hypertension  Likely contributed by severe abdominal pain Pain management in place. Hold PTA HCTZ and lisinopril Continue norvasc 10 mg Continue IV hydralazine PRN Continue to monitor VS  Sinus tachycardia likely in the setting of dehydration from poor oral intake Continue IV fluid, reduce rate to 75 cc/h. Continue to monitor  Chronic constipation Reports no bowel movement in the last 2 weeks Continue bowel regimen Continue clear liquid diet and advance as tolerated  Left heel wound, POA Wound care specialist for local wound care Continue local wound care  CKD 3A Appears to be at her baseline GFR 52 Avoid nephrotoxins and dehydration Monitor urine output Obtain CMP in the morning  History of CVA Continue home medications  Ambulatory dysfunction post CVA Uses a cane to ambulate at baseline PT OT to assess Continue PT OT with assistance and fall precautions   DVT prophylaxis: Sq Lovenox daily   Code Status: Full code per the patient herself  Family Communication: None at her bedside in the ED   Disposition Plan:  Patient is from home.  Anticipate discharge to home in the next 48 hours or when GI signs off.  Barrier to discharge ongoing work-up for intractable abdominal pain and nausea.  Consults called: GI    Objective: Vitals:  03/01/20 1653 03/01/20 2124 03/02/20 0631 03/02/20 1200  BP: 140/82 132/87 (!) 160/97 113/74  Pulse: 88 83 96 80  Resp:  19 18   Temp:  97.9 F (36.6 C)  98.6 F (37 C)   TempSrc:  Oral Oral   SpO2:  100% 100% 99%  Weight:      Height:        Intake/Output Summary (Last 24 hours) at 03/02/2020 1434 Last data filed at 03/02/2020 1000 Gross per 24 hour  Intake 1547.61 ml  Output 1225 ml  Net 322.61 ml   Filed Weights   02/29/20 1304 02/29/20 1954  Weight: 91.6 kg 91.6 kg    Exam:  . General: 59 y.o. year-old female well-developed well-nourished in no acute distress.  Very hard of hearing.  Alert and interactive.   . Cardiovascular: Regular rate and rhythm no rubs or gallops . Respiratory: Clear to auscultation no wheezes or rales.   . Abdomen: Epigastric tenderness on palpation.  Bowel sounds present. . Musculoskeletal: No lower extremity edema bilaterally. Marland Kitchen Psychiatry: Mood is appropriate for condition and setting.  Data Reviewed: CBC: Recent Labs  Lab 02/29/20 1315 02/29/20 1442 03/01/20 1010  WBC 12.8* 13.3* 13.5*  NEUTROABS  --   --  10.8*  HGB 13.8 13.4 11.5*  HCT 43.7 41.8 35.5*  MCV 89.5 90.5 89.4  PLT 308 290 182   Basic Metabolic Panel: Recent Labs  Lab 02/29/20 1315 03/01/20 1010  NA 139 140  K 4.9 4.4  CL 100 107  CO2 22 23  GLUCOSE 249* 178*  BUN 23* 27*  CREATININE 1.20* 1.31*  CALCIUM 9.6 8.9   GFR: Estimated Creatinine Clearance: 49.7 mL/min (A) (by C-G formula based on SCr of 1.31 mg/dL (H)). Liver Function Tests: Recent Labs  Lab 02/29/20 1315 03/01/20 1010  AST 19 11*  ALT 18 14  ALKPHOS 68 57  BILITOT 0.9 0.5  PROT 8.3* 7.4  ALBUMIN 4.1 3.4*   Recent Labs  Lab 02/29/20 1315 02/29/20 1407 03/01/20 1010 03/02/20 0422  LIPASE 54* 53* 33 21   No results for input(s): AMMONIA in the last 168 hours. Coagulation Profile: No results for input(s): INR, PROTIME in the last 168 hours. Cardiac Enzymes: No results for input(s): CKTOTAL, CKMB, CKMBINDEX, TROPONINI in the last 168 hours. BNP (last 3 results) No results for input(s): PROBNP in the last 8760 hours. HbA1C: Recent  Labs    02/29/20 1442  HGBA1C 10.8*   CBG: Recent Labs  Lab 03/01/20 1925 03/01/20 2332 03/02/20 0327 03/02/20 0733 03/02/20 1139  GLUCAP 156* 144* 131* 159* 143*   Lipid Profile: Recent Labs    02/29/20 1315  CHOL 149  HDL 49  LDLCALC 81  TRIG 97  CHOLHDL 3.0   Thyroid Function Tests: No results for input(s): TSH, T4TOTAL, FREET4, T3FREE, THYROIDAB in the last 72 hours. Anemia Panel: No results for input(s): VITAMINB12, FOLATE, FERRITIN, TIBC, IRON, RETICCTPCT in the last 72 hours. Urine analysis:    Component Value Date/Time   COLORURINE YELLOW 02/29/2020 1315   APPEARANCEUR CLEAR 02/29/2020 1315   LABSPEC 1.041 (H) 02/29/2020 1315   PHURINE 6.0 02/29/2020 1315   GLUCOSEU >=500 (A) 02/29/2020 1315   HGBUR NEGATIVE 02/29/2020 1315   BILIRUBINUR NEGATIVE 02/29/2020 1315   BILIRUBINUR negative 12/26/2017 1458   KETONESUR 20 (A) 02/29/2020 1315   PROTEINUR 100 (A) 02/29/2020 1315   UROBILINOGEN 0.2 12/26/2017 1458   UROBILINOGEN 0.2 09/07/2014 0857   NITRITE NEGATIVE 02/29/2020 1315   LEUKOCYTESUR  NEGATIVE 02/29/2020 1315   Sepsis Labs: @LABRCNTIP (procalcitonin:4,lacticidven:4)  )No results found for this or any previous visit (from the past 240 hour(s)).    Studies: MR 3D Recon At Scanner  Result Date: 03/02/2020 CLINICAL DATA:  Acute abdominal pain. Nausea and vomiting. Possible pancreatic mass on recent CT EXAM: MRI ABDOMEN WITHOUT AND WITH CONTRAST (INCLUDING MRCP) TECHNIQUE: Multiplanar multisequence MR imaging of the abdomen was performed both before and after the administration of intravenous contrast. Heavily T2-weighted images of the biliary and pancreatic ducts were obtained, and three-dimensional MRCP images were rendered by post processing. CONTRAST:  66mL GADAVIST GADOBUTROL 1 MMOL/ML IV SOLN COMPARISON:  CT on 02/29/2020 FINDINGS: Lower chest: No acute findings. Hepatobiliary: No hepatic masses identified. Layering sludge is noted in the  gallbladder, but there is no evidence of cholecystitis or biliary ductal dilatation. Pancreas: Swelling of the pancreatic head is seen with minimal adjacent peripancreatic edema/fluid, consistent with focal pancreatitis. No pancreatic mass identified. No evidence of pancreatic ductal dilatation. No evidence of pancreatic necrosis or peripancreatic pseudocysts. Spleen:  Within normal limits in size and appearance. Adrenals/Urinary Tract: No masses identified. No evidence of hydronephrosis. Stomach/Bowel: Visualized portion unremarkable. Vascular/Lymphatic: No pathologically enlarged lymph nodes identified. No abdominal aortic aneurysm. Other:  None. Musculoskeletal:  No suspicious bone lesions identified. IMPRESSION: 1. Focal acute pancreatitis involving the pancreatic head. No evidence of pancreatic necrosis, mass, or ductal dilatation. 2. Gallbladder sludge, without radiographic evidence of cholecystitis or biliary ductal dilatation. Electronically Signed   By: Marlaine Hind M.D.   On: 03/02/2020 08:06   MR ABDOMEN MRCP W WO CONTAST  Result Date: 03/02/2020 CLINICAL DATA:  Acute abdominal pain. Nausea and vomiting. Possible pancreatic mass on recent CT EXAM: MRI ABDOMEN WITHOUT AND WITH CONTRAST (INCLUDING MRCP) TECHNIQUE: Multiplanar multisequence MR imaging of the abdomen was performed both before and after the administration of intravenous contrast. Heavily T2-weighted images of the biliary and pancreatic ducts were obtained, and three-dimensional MRCP images were rendered by post processing. CONTRAST:  44mL GADAVIST GADOBUTROL 1 MMOL/ML IV SOLN COMPARISON:  CT on 02/29/2020 FINDINGS: Lower chest: No acute findings. Hepatobiliary: No hepatic masses identified. Layering sludge is noted in the gallbladder, but there is no evidence of cholecystitis or biliary ductal dilatation. Pancreas: Swelling of the pancreatic head is seen with minimal adjacent peripancreatic edema/fluid, consistent with focal pancreatitis.  No pancreatic mass identified. No evidence of pancreatic ductal dilatation. No evidence of pancreatic necrosis or peripancreatic pseudocysts. Spleen:  Within normal limits in size and appearance. Adrenals/Urinary Tract: No masses identified. No evidence of hydronephrosis. Stomach/Bowel: Visualized portion unremarkable. Vascular/Lymphatic: No pathologically enlarged lymph nodes identified. No abdominal aortic aneurysm. Other:  None. Musculoskeletal:  No suspicious bone lesions identified. IMPRESSION: 1. Focal acute pancreatitis involving the pancreatic head. No evidence of pancreatic necrosis, mass, or ductal dilatation. 2. Gallbladder sludge, without radiographic evidence of cholecystitis or biliary ductal dilatation. Electronically Signed   By: Marlaine Hind M.D.   On: 03/02/2020 08:06    Scheduled Meds: . amLODipine  10 mg Oral Daily  . aspirin EC  81 mg Oral Daily  . atorvastatin  20 mg Oral Daily  . Chlorhexidine Gluconate Cloth  6 each Topical Daily  . DULoxetine  20 mg Oral Daily  . enoxaparin (LOVENOX) injection  40 mg Subcutaneous Q24H  . ezetimibe  10 mg Oral Daily  . insulin aspart  0-9 Units Subcutaneous Q4H  . metoprolol tartrate  12.5 mg Oral BID  . pantoprazole (PROTONIX) IV  40 mg Intravenous Q12H  .  risperiDONE  2 mg Oral QHS  . senna-docusate  2 tablet Oral BID    Continuous Infusions: . sodium chloride 75 mL/hr at 03/01/20 2336     LOS: 2 days     Kayleen Memos, MD [facility] Pager [phone]  If 7PM-7AM, please contact night-coverage [email or web address] Password [facility] 03/02/2020, 2:34 PM

## 2020-03-02 NOTE — Evaluation (Signed)
Physical Therapy Evaluation Patient Details Name: Nancy [name] MRN: [id number] DOB: [birth year] Today's Date: 03/02/2020   History of Present Illness  59 year old female admitted with N/V. ? of acute pancreatitis. PMH includes essential HTN, CVA, Right carotid artery stenosis. Diabetic gastroparesis, neoplasm of partotid gland. Has cataracts (pending surgery)  Clinical Impression  She was in [facility] chair when PT arrived for session. She relates that she was feeling better today, but that her stomach was bothering her "a little". She reports that she had been sitting up for over 2 hours and was getting tired. Agreed to ex format. According to patient she lives with spouse in an apt unit, tub-shower combo with grab bars, elevated commode, no outside steps. She indicates that her sister does a lot of the driving. She relates that she has neuropathies- for which she takes Gabapentin. Admittedly she has been very much out of control with her DM. She is generally weak and very dependent on pain meds. She was instructed in HEP, LE and UE ex as noted on flowsheet. Printed sheet issued. She was transferred back to bed after completion of PT session- assist of 2 with [facility]- she had taken Duladid a few minutes prior, and almost immediately became lightheaded and unsteady. Left with bed in low position, remote in reach, bed alarm activated. She overall presents as very childlike, slow to process, and tends to whisper. She should benefit from ongoing PT for optimal functional outcomes and reduce risk of falls.    Follow Up Recommendations CIR(She presents as a falls risk, and currently very dependent on pain meds)    Equipment Recommendations  Rolling walker with 5" wheels    Recommendations for Other Services       Precautions / Restrictions Precautions Precautions: Fall Precaution Comments: At the end of PT session, she asked for pain meds and RN provided Dulodid- within a few minutes she complained of  feeling light headed- "woozy". Suggest assist of 2 for safety Restrictions Weight Bearing Restrictions: No      Mobility  Bed Mobility Overal bed mobility: Needs Assistance Bed Mobility: Supine to Sit     Supine to sit: Min assist;HOB elevated     General bed mobility comments: Pt able to advance BLE off EOB, assist to steady while coming powerin up trunk and coming to full EOB position.  Transfers Overall transfer level: Needs assistance Equipment used: Rolling walker (2 wheeled);Straight cane Transfers: Sit to/from [facility] Sit to Stand: Min guard;Min assist;From elevated surface;+2 physical assistance Stand pivot transfers: Min guard;Min assist;From elevated surface       General transfer comment: Used SPC (hers) as she wanted to return to bed and she was very sleepy almost immediately with the Dulodid.  Ambulation/Gait Ambulation/Gait assistance: Min assist;+2 physical assistance;Mod assist Gait Distance (Feet): 8 Feet Assistive device: Straight cane Gait Pattern/deviations: Wide base of support;Shuffle   Gait velocity interpretation: 1.31 - 2.62 ft/sec, indicative of limited community ambulator General Gait Details: Social worker    Modified Rankin (Stroke Patients Only)       Balance Overall balance assessment: Needs assistance Sitting-balance support: Feet supported;No upper extremity supported Sitting balance-Leahy Scale: Fair     Standing balance support: Single extremity supported Standing balance-Leahy Scale: Fair(P+,F-) Standing balance comment: min-mod A of 2 for ambulating BS chair back to bed- unsteady.  Pertinent Vitals/Pain Pain Assessment: Faces Faces Pain Scale: Hurts little more Pain Location: abdomen Pain Descriptors / Indicators: Guarding Pain Intervention(s): Monitored during session;Limited activity within patient's  tolerance;Repositioned    Home Living Family/patient expects to be discharged to:: Private residence Living Arrangements: Spouse/significant other Available Help at Discharge: Family;Other (Comment) Type of Home: Apartment Home Access: Stairs to enter Entrance Stairs-Rails: None Entrance Stairs-Number of Steps: 3 Home Layout: One level Home Equipment: Shower seat;Cane - single point      Prior Function Level of Independence: Needs assistance   Gait / Transfers Assistance Needed: cane at baseline. mostly limited to household distances but does walk to car with spouse once a week for appointments  ADL's / Homemaking Assistance Needed: spouse assist with bathing/dressing as needed depending on how pt is feeling per her report        Hand Dominance   Dominant Hand: Right    Extremity/Trunk Assessment   Upper Extremity Assessment Upper Extremity Assessment: Generalized weakness    Lower Extremity Assessment Lower Extremity Assessment: Generalized weakness    Cervical / Trunk Assessment Cervical / Trunk Assessment: Kyphotic  Communication   Communication: Expressive difficulties(She presents as very childlikep slow to process, also whispers.)  Cognition Arousal/Alertness: Awake/alert Behavior During Therapy: Flat affect;WFL for tasks assessed/performed Overall Cognitive Status: No family/caregiver present to determine baseline cognitive functioning                                 General Comments: A&O x3. delayed responses at times. slow processing. difficulty sequencing      General Comments General comments (skin integrity, edema, etc.): Monitor skin and joint integrity closely- has wounds on LEFT foot- dressing in place.    Exercises General Exercises - Lower Extremity Ankle Circles/Pumps: Seated;AROM;Both;10 reps Quad Sets: AROM;Seated Gluteal Sets: AROM;Seated;Both;10 reps Short Arc Quad: AROM;Seated;Both;10 reps Heel Slides: AROM;Seated;Both;10  reps Hip ABduction/ADduction: AROM;10 reps;Both;Seated Other Exercises Other Exercises: Instructed in all LE format and UE shoulder FLEXION and ABD- printed sheet issued with good return demo   Assessment/Plan    PT Assessment Patient needs continued PT services  PT Problem List Decreased strength;Decreased activity tolerance;Decreased safety awareness;Decreased mobility       PT Treatment Interventions DME instruction;Gait training;Functional mobility training;Patient/family education;Therapeutic activities;Therapeutic exercise    PT Goals (Current goals can be found in the Care Plan section)  Acute Rehab PT Goals Patient Stated Goal: Feel better and be able to go home Time For Goal Achievement: 03/16/20 Potential to Achieve Goals: Good    Frequency Min 3X/week   Barriers to discharge        Co-evaluation               AM-PAC PT "6 Clicks" Mobility  Outcome Measure Help needed turning from your back to your side while in a flat bed without using bedrails?: A Little Help needed moving from lying on your back to sitting on the side of a flat bed without using bedrails?: A Little Help needed moving to and from a bed to a chair (including a wheelchair)?: A Lot Help needed standing up from a chair using your arms (e.g., wheelchair or bedside chair)?: A Lot Help needed to walk in hospital room?: A Lot Help needed climbing 3-5 steps with a railing? : A Lot 6 Click Score: 14    End of Session   Activity Tolerance: Patient tolerated treatment well Patient left: in bed;with call bell/phone within  reach;with bed alarm set   PT Visit Diagnosis: Unsteadiness on feet (R26.81);Muscle weakness (generalized) (M62.81);Other abnormalities of gait and mobility (R26.89)    Time: 9150-4136 PT Time Calculation (min) (ACUTE ONLY): 32 min   Charges:     PT Treatments $Therapeutic Exercise: 8-22 mins       Rollen Sox, PT # [phone] CGV cell  Casandra Doffing 03/02/2020, 4:22  PM

## 2020-03-02 NOTE — Progress Notes (Addendum)
Called the patient's spouse to give updates.  No answer.  Left a voicemail message.   GI [facility] has been consulted to assist in the management of her intractable abdominal pain and nausea.  Lab studies unremarkable.  Images suggestive of acute pancreatitis.  Discussed with Dr. Carlean Purl.  Possible EGD in the AM.  NPO after midnight.

## 2020-03-02 NOTE — Progress Notes (Signed)
Rehab Admissions Coordinator Note:  Patient was screened by Cleatrice Burke for appropriateness for an Inpatient Acute Rehab Consult per PT recommendation for CIR. OT recommends SNF.   At this time, we are recommending await further progress with therapy once pain and nausea controlled. Noted GI consulted I will follow. Please call me with any questions.   Cleatrice Burke RN MSN 03/02/2020, 6:46 PM  I can be reached at [phone].

## 2020-03-02 NOTE — Telephone Encounter (Signed)
Completed referral to [facility] faxed

## 2020-03-03 ENCOUNTER — Encounter (HOSPITAL_COMMUNITY)
Admission: EM | Disposition: A | Discharge status: Skilled Nursing Facility | Payer: Self-pay | Source: Home / Self Care | Attending: Internal Medicine

## 2020-03-03 ENCOUNTER — Inpatient Hospital Stay (HOSPITAL_COMMUNITY): Payer: 59 | Admitting: Certified Registered Nurse Anesthetist

## 2020-03-03 ENCOUNTER — Inpatient Hospital Stay (HOSPITAL_COMMUNITY): Payer: 59

## 2020-03-03 DIAGNOSIS — R112 Nausea with vomiting, unspecified: Secondary | ICD-10-CM

## 2020-03-03 DIAGNOSIS — R1013 Epigastric pain: Secondary | ICD-10-CM

## 2020-03-03 DIAGNOSIS — K859 Acute pancreatitis without necrosis or infection, unspecified: Secondary | ICD-10-CM

## 2020-03-03 DIAGNOSIS — R1084 Generalized abdominal pain: Secondary | ICD-10-CM

## 2020-03-03 HISTORY — PX: ESOPHAGOGASTRODUODENOSCOPY (EGD) WITH PROPOFOL: SHX5813

## 2020-03-03 LAB — CBC WITH DIFFERENTIAL/PLATELET
Abs Immature Granulocytes: 0.02 10*3/uL (ref 0.00–0.07)
Basophils Absolute: 0 10*3/uL (ref 0.0–0.1)
Basophils Relative: 0 %
Eosinophils Absolute: 0.1 10*3/uL (ref 0.0–0.5)
Eosinophils Relative: 1 %
HCT: 31.4 % — ABNORMAL LOW (ref 36.0–46.0)
Hemoglobin: 9.7 g/dL — ABNORMAL LOW (ref 12.0–15.0)
Immature Granulocytes: 0 %
Lymphocytes Relative: 46 %
Lymphs Abs: 4.9 10*3/uL — ABNORMAL HIGH (ref 0.7–4.0)
MCH: 28.9 pg (ref 26.0–34.0)
MCHC: 30.9 g/dL (ref 30.0–36.0)
MCV: 93.5 fL (ref 80.0–100.0)
Monocytes Absolute: 0.9 10*3/uL (ref 0.1–1.0)
Monocytes Relative: 8 %
Neutro Abs: 4.9 10*3/uL (ref 1.7–7.7)
Neutrophils Relative %: 45 %
Platelets: 222 10*3/uL (ref 150–400)
RBC: 3.36 MIL/uL — ABNORMAL LOW (ref 3.87–5.11)
RDW: 12.5 % (ref 11.5–15.5)
WBC: 10.9 10*3/uL — ABNORMAL HIGH (ref 4.0–10.5)
nRBC: 0 % (ref 0.0–0.2)

## 2020-03-03 LAB — GLUCOSE, CAPILLARY
Glucose-Capillary: 107 mg/dL — ABNORMAL HIGH (ref 70–99)
Glucose-Capillary: 97 mg/dL (ref 70–99)
Glucose-Capillary: 119 mg/dL — ABNORMAL HIGH (ref 70–99)
Glucose-Capillary: 95 mg/dL (ref 70–99)
Glucose-Capillary: 105 mg/dL — ABNORMAL HIGH (ref 70–99)
Glucose-Capillary: 173 mg/dL — ABNORMAL HIGH (ref 70–99)

## 2020-03-03 LAB — COMPREHENSIVE METABOLIC PANEL
ALT: 13 U/L (ref 0–44)
AST: 11 U/L — ABNORMAL LOW (ref 15–41)
Albumin: 3 g/dL — ABNORMAL LOW (ref 3.5–5.0)
Alkaline Phosphatase: 44 U/L (ref 38–126)
Anion gap: 7 (ref 5–15)
BUN: 36 mg/dL — ABNORMAL HIGH (ref 6–20)
CO2: 25 mmol/L (ref 22–32)
Calcium: 8.4 mg/dL — ABNORMAL LOW (ref 8.9–10.3)
Chloride: 107 mmol/L (ref 98–111)
Creatinine, Ser: 1.72 mg/dL — ABNORMAL HIGH (ref 0.44–1.00)
GFR calc Af Amer: 37 mL/min — ABNORMAL LOW (ref >60)
GFR calc non Af Amer: 32 mL/min — ABNORMAL LOW (ref >60)
Glucose, Bld: 105 mg/dL — ABNORMAL HIGH (ref 70–99)
Potassium: 4.3 mmol/L (ref 3.5–5.1)
Sodium: 139 mmol/L (ref 135–145)
Total Bilirubin: 0.4 mg/dL (ref 0.3–1.2)
Total Protein: 6.3 g/dL — ABNORMAL LOW (ref 6.5–8.1)

## 2020-03-03 LAB — LIPASE, BLOOD: Lipase: 20 U/L (ref 11–51)

## 2020-03-03 LAB — RESPIRATORY PANEL BY RT PCR (FLU A&B, COVID)
Influenza A by PCR: NEGATIVE
Influenza B by PCR: NEGATIVE
SARS Coronavirus 2 by RT PCR: NEGATIVE

## 2020-03-03 LAB — MAGNESIUM: Magnesium: 2.2 mg/dL (ref 1.7–2.4)

## 2020-03-03 SURGERY — ESOPHAGOGASTRODUODENOSCOPY (EGD) WITH PROPOFOL
Anesthesia: Monitor Anesthesia Care

## 2020-03-03 MED ORDER — BISACODYL 10 MG RE SUPP
10.0000 mg | Freq: Once | RECTAL | Status: AC
  Administered 2020-03-03: 10 mg via RECTAL
  Filled 2020-03-03: qty 1

## 2020-03-03 MED ORDER — PROPOFOL 500 MG/50ML IV EMUL
INTRAVENOUS | Status: AC
  Filled 2020-03-03: qty 50

## 2020-03-03 MED ORDER — LACTATED RINGERS IV SOLN
INTRAVENOUS | Status: AC | PRN
  Administered 2020-03-03: 1000 mL via INTRAVENOUS

## 2020-03-03 MED ORDER — PROPOFOL 10 MG/ML IV BOLUS
INTRAVENOUS | Status: AC
  Filled 2020-03-03: qty 20

## 2020-03-03 MED ORDER — DIPHENHYDRAMINE HCL 25 MG PO CAPS: 25.0000 mg | ORAL_CAPSULE | Freq: Three times a day (TID) | ORAL | Status: DC | PRN

## 2020-03-03 MED ORDER — LIDOCAINE 2% (20 MG/ML) 5 ML SYRINGE
INTRAMUSCULAR | Status: DC | PRN
  Administered 2020-03-03: 80 mg via INTRAVENOUS

## 2020-03-03 MED ORDER — PROPOFOL 500 MG/50ML IV EMUL
INTRAVENOUS | Status: DC | PRN
  Administered 2020-03-03: 125 ug/kg/min via INTRAVENOUS

## 2020-03-03 MED ORDER — PROPOFOL 10 MG/ML IV BOLUS
INTRAVENOUS | Status: DC | PRN
  Administered 2020-03-03: 10 mg via INTRAVENOUS

## 2020-03-03 SURGICAL SUPPLY — 14 items

## 2020-03-03 NOTE — Anesthesia Postprocedure Evaluation (Signed)
Anesthesia Post Note  Patient: Nancy [name]  Procedure(s) Performed: ESOPHAGOGASTRODUODENOSCOPY (EGD) WITH PROPOFOL (N/A )     Patient location during evaluation: PACU Anesthesia Type: MAC Level of consciousness: awake and alert Pain management: pain level controlled Vital Signs Assessment: post-procedure vital signs reviewed and stable Respiratory status: spontaneous breathing Cardiovascular status: stable Anesthetic complications: no    Last Vitals:  Vitals:   03/03/20 1620 03/03/20 1634  BP: (!) 179/87 (!) 155/81  Pulse: 87 87  Resp: 13 12  Temp:  36.6 C  SpO2: 99% 100%    Last Pain:  Vitals:   03/03/20 2010  TempSrc:   PainSc: 0-No pain                 Nolon Nations

## 2020-03-03 NOTE — Transfer of Care (Signed)
Immediate Anesthesia Transfer of Care Note  Patient: Nancy [name]  Procedure(s) Performed: ESOPHAGOGASTRODUODENOSCOPY (EGD) WITH PROPOFOL (N/A )  Patient Location: Endoscopy Unit  Anesthesia Type:MAC  Level of Consciousness: drowsy  Airway & Oxygen Therapy: Patient Spontanous Breathing and Patient connected to nasal cannula oxygen  Post-op Assessment: Report given to RN and Post -op Vital signs reviewed and stable  Post vital signs: Reviewed and stable  Last Vitals:  Vitals Value Taken Time  BP 129/84 03/03/20 1606  Temp    Pulse 83 03/03/20 1607  Resp 12 03/03/20 1607  SpO2 100 % 03/03/20 1607  Vitals shown include unvalidated device data.  Last Pain:  Vitals:   03/03/20 1437  TempSrc: Oral  PainSc:       Patients Stated Pain Goal: 2 (48/01/65 5374)  Complications: No apparent anesthesia complications

## 2020-03-03 NOTE — Anesthesia Preprocedure Evaluation (Addendum)
Anesthesia Evaluation  Patient identified by MRN, date of birth, ID band Patient awake    Reviewed: Allergy & Precautions, NPO status , Patient's Chart, lab work & pertinent test results  Airway Mallampati: II  TM Distance: >3 FB Neck ROM: Full    Dental  (+) Dental Advisory Given   Pulmonary former smoker,    Pulmonary exam normal breath sounds clear to auscultation       Cardiovascular hypertension, Pt. on medications Normal cardiovascular exam Rhythm:Regular Rate:Normal  ECG: ST, rate 102  ECHO: 1. Left ventricular ejection fraction, by visual estimation, is 55 to 60%. The left ventricle has normal function. There is no left ventricular hypertrophy. 2. Global right ventricle has normal systolic function.The right ventricular size is normal. No increase in right ventricular wall thickness. 3. Left atrial size was normal. 4. Right atrial size was normal. 5. The mitral valve is normal in structure. Trace mitral valve regurgitation. No evidence of mitral stenosis. 6. The tricuspid valve is normal in structure. Tricuspid valve regurgitation is trivial. 7. The aortic valve is tricuspid. Aortic valve regurgitation is not visualized. No evidence of aortic valve sclerosis or stenosis. 8. The pulmonic valve was normal in structure. Pulmonic valve regurgitation is not visualized. 9. Normal pulmonary artery systolic pressure. 10. The inferior vena cava is normal in size with <50% respiratory variability, suggesting right atrial pressure of 8 mmHg.   Neuro/Psych PSYCHIATRIC DISORDERS Depression CVA    GI/Hepatic Neg liver ROS, GERD  Medicated,IBS (irritable bowel syndrome)   Endo/Other  diabetes, Insulin Dependent  Renal/GU ARFRenal disease     Musculoskeletal negative musculoskeletal ROS (+)   Abdominal (+) + obese,   Peds  Hematology  (+) anemia , HLD   Anesthesia Other Findings Abdominal pain, Nausea and Vomiting  Reproductive/Obstetrics                            Anesthesia Physical Anesthesia Plan  ASA: III  Anesthesia Plan: MAC   Post-op Pain Management:    Induction:   PONV Risk Score and Plan: 2 and Treatment may vary due to age or medical condition and Propofol infusion  Airway Management Planned: Nasal Cannula  Additional Equipment:   Intra-op Plan:   Post-operative Plan:   Informed Consent:   Plan Discussed with:   Anesthesia Plan Comments:         Anesthesia Quick Evaluation

## 2020-03-03 NOTE — TOC Initial Note (Signed)
Transition of Care [facility]) - Initial/Assessment Note    Patient Details  Name: Nancy [name] MRN: [id number] Date of Birth: 1961-09-10  Transition of Care [facility]) CM/SW Contact:    Lia Hopping, [place] Phone Number: 03/03/2020, 4:28 PM  Clinical Narrative:                 CIR following for placement needs .  TOC staff will follow for SNF needs if denied by CIR PASRR completed-level II for manuel review.   Expected Discharge Plan: IP Rehab Facility Barriers to Discharge: Continued Medical Work up   Patient Goals and CMS Choice   CMS Medicare.gov Compare Post Acute Care list provided to:: Patient Choice offered to / list presented to : Patient  Expected Discharge Plan and Services Expected Discharge Plan: [place] In-house Referral: Clinical Social Work   Post Acute Care Choice: [place], IP Rehab Living arrangements for the past 2 months: Single Family Home                                      Prior Living Arrangements/Services Living arrangements for the past 2 months: Single Family Home Lives with:: Spouse Patient language and need for interpreter reviewed:: No Do you feel safe going back to the place where you live?: Yes      Need for Family Participation in Patient Care: Yes (Comment)     Criminal Activity/Legal Involvement Pertinent to Current Situation/Hospitalization: No - Comment as needed  Activities of Daily Living Home Assistive Devices/Equipment: Eyeglasses, Cane (specify quad or straight) ADL Screening (condition at time of admission) Patient's cognitive ability adequate to safely complete daily activities?: Yes Is the patient deaf or have difficulty hearing?: No Does the patient have difficulty seeing, even when wearing glasses/contacts?: No Does the patient have difficulty concentrating, remembering, or making decisions?: No Patient able to express need for assistance with ADLs?: Yes Does the patient have  difficulty dressing or bathing?: No Independently performs ADLs?: Yes (appropriate for developmental age) Does the patient have difficulty walking or climbing stairs?: Yes Weakness of Legs: Both(weakness both knees) Weakness of Arms/Hands: None  Permission Sought/Granted Permission sought to share information with : Customer service manager                Emotional Assessment Appearance:: Appears stated age Attitude/Demeanor/Rapport: Engaged Affect (typically observed): Calm Orientation: : Oriented to Self, Oriented to Place, Oriented to  Time, Oriented to Situation Alcohol / Substance Use: Not Applicable Psych Involvement: No (comment)  Admission diagnosis:  Acute pancreatitis [K85.90] Generalized abdominal pain [R10.84] Nausea and vomiting, intractability of vomiting not specified, unspecified vomiting type [R11.2] Patient Active Problem List   Diagnosis Date Noted  . Generalized abdominal pain   . Acute pancreatitis 02/29/2020  . Cerebrovascular accident (CVA) ([place]) 11/08/2019  . Stenosis of right carotid artery 11/08/2019  . Hyperlipidemia associated with type 2 diabetes mellitus ([place]) 11/08/2019  . Cortical age-related cataract of both eyes 08/30/2019  . Gait abnormality 08/30/2019  . Statin declined 08/30/2019  . Gastroesophageal reflux disease without esophagitis 04/18/2018  . 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 Uncontrolled type 2 diabetes mellitus with diabetic neuropathy, with long-term current use of insulin ([place]) 10/29/2017  . History of macular degeneration 10/29/2017  . Chronic cervical pain 10/29/2016  . Myalgia 09/14/2016  . Insomnia 09/14/2016  . Tinea pedis of both feet 08/20/2016  . Macular degeneration 08/17/2016  .  Low back pain 09/21/2014  . Hypertension 09/21/2014  . Diabetic gastroparesis associated with type 2 diabetes mellitus ([place]) 09/14/2014  . Thyroid nodule 08/17/2014  . Obesity (BMI 30-39.9) 08/17/2014  . Hyperlipidemia 08/05/2014  .  Type II diabetes mellitus with neurological manifestations, uncontrolled ([place]) 08/04/2014  . Elevated serum protein level 08/04/2014   PCP:  [name], MD Pharmacy:   [facility] [address] [place] [address] Phone: [phone] Fax: [phone]     Social Determinants of Health (SDOH) Interventions    Readmission Risk Interventions No flowsheet data found.

## 2020-03-03 NOTE — Progress Notes (Signed)
PROGRESS NOTE  Nancy [name] QMG:[id number] DOB: [birth year] DOA: 02/29/2020 PCP: Nancy Pier, MD  HPI/Recap of past 24 hours:  Nancy [name] is a 59 y.o. female with medical history significant for essential hypertension, CVA, right carotid artery stenosis, diabetic gastroparesis, type 2 diabetes, neoplasm of parotid gland, GERD, hyperlipidemia, gait abnormality who presented to [name] ED with complaints of worsening nausea of 2 weeks duration.  Associated with vomiting and abdominal pain.  In the past 2 days she has not been able to keep anything down.  Vomits every time she eats.  Prior to presenting to the ED vomited 6 times and while in the ED 4 times.  Denies a personal history of gallstones or pancreatitis.  No use of alcoholic beverage.  No significant weight loss.  CT scan abdomen pelvis with contrast done in the ED showed evidence of acute pancreatitis.   TRH asked to admit.  Initial Lipase elevated at 54, normalized.  MRI abdomen negative for mass but showing acute pancreatitis.  Lab work unremarkable despite persistent symptomatology.  Last EGD 2015 by Dr. Paulita [name] was normal.  [facility]ancy [name] consulted to assist with the management.  03/03/20: Seen and examined.  Persistent abdominal pain.  Nausea is intermittent, improved with IV antiemetics.  Seen by Nancy with plan for EGD to rule out other pathology.    Assessment/Plan: Active Problems:   Acute pancreatitis   Generalized abdominal pain   Focal acute pancreatitis on CT and MRI As evidenced by symptomatology and positive CT scan for acute pancreatitis.   MRI also showing acute pancreatittis Negative lipase. LFTs normal, calcium, Tbili, triglycerides normal Lactic acid and procalcitonin negative Last EGD normal 2015 EGD planned today to rule out other pathology Nancy following.  Appreciate assistance. Continue supportive care with IV fluid, IV antiemetics and pain management  Resolving leukocytosis suspect reactive in the  setting of intraabdominal pathology  WBC 13.3K>> 13.5K>.10.9K Afebrile UA negative, CT lung field negative for lobular infiltrates Neutrophilia resolved Procalcitonin and lactic acid negative. No clear sources of active infective process.  Hyperlipidemia Triglycerides normal Continue home medications  Type 2 diabetes with hyperglycemia Hemoglobin A1c 10.8 on 02/29/2020. Continue as tolerated clear liquid diet. Advance diet as tolerated post EGD  Resolved uncontrolled hypertension  Likely contributed by severe abdominal pain Pain management in place. Hold PTA HCTZ and lisinopril Continue norvasc 10 mg Continue IV hydralazine PRN Continue to monitor VS  Sinus tachycardia likely in the setting of dehydration from poor oral intake Continue IV fluid, reduce rate to 75 cc/h. Continue to monitor  Chronic constipation Reports no bowel movement in the last 2 weeks Continue bowel regimen Continue clear liquid diet and advance as tolerated  Left heel wound, POA Wound care specialist for local wound care Continue local wound care  CKD 3A Appears to be at her baseline GFR 52 Avoid nephrotoxins and dehydration Monitor urine output  History of CVA Continue home medications  Ambulatory dysfunction post CVA Uses a cane to ambulate at baseline PT OT recommend CIR or SNF Continue PT OT with assistance and fall precautions TOC assisting with placement   DVT prophylaxis: Sq Lovenox daily   Code Status: Full code per the patient herself  Family Communication: None at her bedside in the ED   Disposition Plan:  Patient is from home.  Anticipate discharge to home in the next 48 hours or when Nancy signs off.  Barrier to discharge ongoing work-up for intractable abdominal pain and nausea.  Consults called: Nancy  Objective: Vitals:   03/03/20 0411 03/03/20 1243 03/03/20 1344 03/03/20 1437  BP:   131/71 (!) 163/84  Pulse:   80   Resp:    10  Temp:   98.5 F (36.9  C) 98.7 F (37.1 C)  TempSrc:   Oral Oral  SpO2: 99% 99% 97% 97%  Weight:      Height:        Intake/Output Summary (Last 24 hours) at 03/03/2020 1516 Last data filed at 03/03/2020 1400 Gross per 24 hour  Intake 1900.32 ml  Output 850 ml  Net 1050.32 ml   Filed Weights   02/29/20 1304 02/29/20 1954  Weight: 91.6 kg 91.6 kg    Exam:  . General: 59 y.o. year-old female well-developed well-nourished.  No acute distress.  Alert and interactive. . Cardiovascular: Regular rate and rhythm no rubs or gallops.   Marland Kitchen Respiratory: Clear to Auscultation No Wheezes or Rales.   . Abdomen: Epigastric and left upper quadrant pain noted with palpation.  Bowel sounds present. . Musculoskeletal: No lower extremity edema. Marland Kitchen Psychiatry: Mood is appropriate for condition . Data Reviewed: CBC: Recent Labs  Lab 02/29/20 1315 02/29/20 1442 03/01/20 1010 03/03/20 0341  WBC 12.8* 13.3* 13.5* 10.9*  NEUTROABS  --   --  10.8* 4.9  HGB 13.8 13.4 11.5* 9.7*  HCT 43.7 41.8 35.5* 31.4*  MCV 89.5 90.5 89.4 93.5  PLT 308 290 254 878   Basic Metabolic Panel: Recent Labs  Lab 02/29/20 1315 03/01/20 1010 03/03/20 0341  NA 139 140 139  K 4.9 4.4 4.3  CL 100 107 107  CO2 22 23 25   GLUCOSE 249* 178* 105*  BUN 23* 27* 36*  CREATININE 1.20* 1.31* 1.72*  CALCIUM 9.6 8.9 8.4*  MG  --   --  2.2   GFR: Estimated Creatinine Clearance: 37.9 mL/min (A) (by C-G formula based on SCr of 1.72 mg/dL (H)). Liver Function Tests: Recent Labs  Lab 02/29/20 1315 03/01/20 1010 03/03/20 0341  AST 19 11* 11*  ALT 18 14 13   ALKPHOS 68 57 44  BILITOT 0.9 0.5 0.4  PROT 8.3* 7.4 6.3*  ALBUMIN 4.1 3.4* 3.0*   Recent Labs  Lab 02/29/20 1315 02/29/20 1407 03/01/20 1010 03/02/20 0422 03/03/20 0341  LIPASE 54* 53* 33 21 20   No results for input(s): AMMONIA in the last 168 hours. Coagulation Profile: No results for input(s): INR, PROTIME in the last 168 hours. Cardiac Enzymes: No results for input(s):  CKTOTAL, CKMB, CKMBINDEX, TROPONINI in the last 168 hours. BNP (last 3 results) No results for input(s): PROBNP in the last 8760 hours. HbA1C: No results for input(s): HGBA1C in the last 72 hours. CBG: Recent Labs  Lab 03/02/20 2325 03/03/20 0323 03/03/20 0740 03/03/20 1152 03/03/20 1504  GLUCAP 160* 107* 97 119* 95   Lipid Profile: No results for input(s): CHOL, HDL, LDLCALC, TRIG, CHOLHDL, LDLDIRECT in the last 72 hours. Thyroid Function Tests: No results for input(s): TSH, T4TOTAL, FREET4, T3FREE, THYROIDAB in the last 72 hours. Anemia Panel: No results for input(s): VITAMINB12, FOLATE, FERRITIN, TIBC, IRON, RETICCTPCT in the last 72 hours. Urine analysis:    Component Value Date/Time   COLORURINE YELLOW 02/29/2020 1315   APPEARANCEUR CLEAR 02/29/2020 1315   LABSPEC 1.041 (H) 02/29/2020 1315   PHURINE 6.0 02/29/2020 1315   GLUCOSEU >=500 (A) 02/29/2020 1315   HGBUR NEGATIVE 02/29/2020 1315   BILIRUBINUR NEGATIVE 02/29/2020 1315   BILIRUBINUR negative 12/26/2017 1458   KETONESUR 20 (A) 02/29/2020 1315  PROTEINUR 100 (A) 02/29/2020 1315   UROBILINOGEN 0.2 12/26/2017 1458   UROBILINOGEN 0.2 09/07/2014 0857   NITRITE NEGATIVE 02/29/2020 1315   LEUKOCYTESUR NEGATIVE 02/29/2020 1315   Sepsis Labs: @LABRCNTIP (procalcitonin:4,lacticidven:4)  ) Recent Results (from the past 240 hour(s))  Respiratory Panel by RT PCR (Flu A&B, Covid) - Nasopharyngeal Swab     Status: None   Collection Time: 03/03/20 11:15 AM   Specimen: Nasopharyngeal Swab  Result Value Ref Range Status   SARS Coronavirus 2 by RT PCR NEGATIVE NEGATIVE Final    Comment: (NOTE) SARS-CoV-2 target nucleic acids are NOT DETECTED. The SARS-CoV-2 RNA is generally detectable in upper respiratoy specimens during the acute phase of infection. The lowest concentration of SARS-CoV-2 viral copies this assay can detect is 131 copies/mL. A negative result does not preclude SARS-Cov-2 infection and should not be used  as the sole basis for treatment or other patient management decisions. A negative result may occur with  improper specimen collection/handling, submission of specimen other than nasopharyngeal swab, presence of viral mutation(s) within the areas targeted by this assay, and inadequate number of viral copies (<131 copies/mL). A negative result must be combined with clinical observations, patient history, and epidemiological information. The expected result is Negative. Fact Sheet for Patients:  PinkCheek.be Fact Sheet for Healthcare Providers:  GravelBags.it This test is not yet ap proved or cleared by the [place] FDA and  has been authorized for detection and/or diagnosis of SARS-CoV-2 by FDA under an Emergency Use Authorization (EUA). This EUA will remain  in effect (meaning this test can be used) for the duration of the COVID-19 declaration under Section 564(b)(1) of the Act, 21 U.S.C. section 360bbb-3(b)(1), unless the authorization is terminated or revoked sooner.    Influenza A by PCR NEGATIVE NEGATIVE Final   Influenza B by PCR NEGATIVE NEGATIVE Final    Comment: (NOTE) The Xpert Xpress SARS-CoV-2/FLU/RSV assay is intended as an aid in  the diagnosis of influenza from Nasopharyngeal swab specimens and  should not be used as a sole basis for treatment. Nasal washings and  aspirates are unacceptable for Xpert Xpress SARS-CoV-2/FLU/RSV  testing. Fact Sheet for Patients: PinkCheek.be Fact Sheet for Healthcare Providers: GravelBags.it This test is not yet approved or cleared by the [place] FDA and  has been authorized for detection and/or diagnosis of SARS-CoV-2 by  FDA under an Emergency Use Authorization (EUA). This EUA will remain  in effect (meaning this test can be used) for the duration of the  Covid-19 declaration under Section 564(b)(1) of the Act,  21  U.S.C. section 360bbb-3(b)(1), unless the authorization is  terminated or revoked. Performed at [facility], [place] [address] [place], [place] [address]       Studies: DG Abd Portable 1V  Result Date: 03/03/2020 CLINICAL DATA:  Abdominal pain EXAM: PORTABLE ABDOMEN - 1 VIEW COMPARISON:  CT 02/29/2020 FINDINGS: Moderate stool burden. Nonobstructive bowel gas pattern. No organomegaly, suspicious calcification or free air. IMPRESSION: Moderate stool burden.  No acute findings. Electronically Signed   By: Rolm Baptise M.D.   On: 03/03/2020 08:45    Scheduled Meds: . [MAR Hold] amLODipine  10 mg Oral Daily  . [MAR Hold] aspirin EC  81 mg Oral Daily  . [MAR Hold] atorvastatin  20 mg Oral Daily  . [MAR Hold] Chlorhexidine Gluconate Cloth  6 each Topical Daily  . [MAR Hold] DULoxetine  20 mg Oral Daily  . [MAR Hold] enoxaparin (LOVENOX) injection  40 mg Subcutaneous Q24H  . [  MAR Hold] ezetimibe  10 mg Oral Daily  . [MAR Hold] insulin aspart  0-9 Units Subcutaneous Q4H  . [MAR Hold] metoprolol tartrate  12.5 mg Oral BID  . [MAR Hold] pantoprazole (PROTONIX) IV  40 mg Intravenous Q12H  . [MAR Hold] risperiDONE  2 mg Oral QHS  . [MAR Hold] senna-docusate  2 tablet Oral BID    Continuous Infusions: . sodium chloride 75 mL/hr at 03/03/20 0026  . lactated ringers 1,000 mL (03/03/20 1510)     LOS: 3 days     Kayleen Memos, MD [facility] Pager [phone]  If 7PM-7AM, please contact night-coverage [email or web address] Password [name] 03/03/2020, 3:16 PM

## 2020-03-03 NOTE — Consult Note (Addendum)
Consultation  Referring Provider: Dr. Nevada Crane    Primary Care Physician:  [name], MD Primary Gastroenterologist: Althia Forts       Reason for Consultation: Pancreatitis, epigastric pain and nausea and vomiting         HPI:   Nancy [name] is a 59 y.o. female with a past medical history significant for hypertension, CVA, right carotid artery stenosis, diabetic gastroparesis, type 2 diabetes, neoplasm of parotid gland, GERD and others, who presented to the ER initially on 02/29/2020 with worsening nausea of 2 weeks duration.  Patient had CT scan of the abdomen pelvis with contrast done in the ED which showed evidence of acute pancreatitis with a lipase elevated at 54.  We are consulted as patient is not getting any better with conservative treatment over the past 3 days.    Today, the patient explains that she was nauseous for 2 weeks and had some epigastric pain rated as a 9/10 at times.  Then over the past 3 days she started vomiting and could not keep anything down, even a sip of water, and her epigastric pain increased in intensity.  Since being in the hospital her nausea and vomiting has gotten better but she still complains of an epigastric pain which can increase to a 9/10 at times but seems to come and go.  Describes a history of being diagnosed with gastroparesis back in 2015 when she had epigastric pain.  Tells me that she has not had any symptoms from this over the past 4 years.  Typically she only battles constipation.  Currently reports that she has not had a bowel movement in 2-1/2 weeks regardless of Senokot and Citrucel at home as well as Senokot here in the hospital.  Also tells me she has not passed gas in a week.    Denies history of alcohol use or previous history of pancreatitis.    Denies fever, chills, blood in her stool or symptoms that awaken her from sleep.  Hospital course: 02/29/2020-admitted with signs of acute pancreatitis on CT suspected from gallstones,  started on IV fluids normal saline at 125 cc/h and IV Dilaudid as well as antiemetics 03/01/2020-patient reported epigastric pain is persistent and had complaints of dyspnea without hypoxia, white count increased from 13.3-13.5-MRI was ordered to further assess 03/02/2020-continue to report persistent pain and nausea with unclear etiology, started on IV Protonix 40 twice daily and a GI cocktail, MRI reviewed and showed acute pancreatitis with no signs of pancreatic necrosis, mass or ductal dilatation, there was gallbladder sludge without radiographic evidence of cholecystitis or biliary ductal dilatation  GI history: 09/13/2014 gastric emptying study: Borderline abnormal, 31% retention at 120 minutes and normal is less than 30%. 2015 EGD by Dr. Paulita Fujita in the hospital: Done for epigastric abdominal pain was normal  Past Medical History:  Diagnosis Date  . Anemia 2006  . Depression 2014   previously on amitryptiline   . Diabetes mellitus with neurological manifestation ([place]) 2006  . Fracture of left ankle 1997   . Gastroparesis 07/2016  . HOH (hard of hearing) 2004   . Hyperlipidemia 2006  . Hypertension 2006  . IBS (irritable bowel syndrome) 2002   . Leukopenia 2015   . Macular degeneration 11/2019  . Shingles 2009   . Stroke ([place]) 2020  . Thyroid nodule 2004    Past Surgical History:  Procedure Laterality Date  . ABDOMINAL HYSTERECTOMY  2005  . CATARACT EXTRACTION Left 11/2019  . CESAREAN SECTION  1983   . ESOPHAGOGASTRODUODENOSCOPY (EGD) WITH PROPOFOL Left 08/26/2014   Procedure: ESOPHAGOGASTRODUODENOSCOPY (EGD) WITH PROPOFOL;  Surgeon: Arta Silence, MD;  Location: [facility];  Service: Endoscopy;  Laterality: Left;    Family History  Problem Relation Age of Onset  . Hypertension Mother   . Heart disease Mother   . Diabetes Mother   . Thyroid disease Mother   . Congestive Heart Failure Mother   . Breast cancer Maternal Grandmother   . Colon cancer Maternal Grandfather     . Heart attack Sister   . Heart disease Brother   . Hyperlipidemia Brother   . Hypertension Brother   . Diabetes Father   . Breast cancer Maternal Aunt     Social History   Tobacco Use  . Smoking status: Former Smoker    Packs/day: 0.25    Years: 0.50    Pack years: 0.12    Types: Cigarettes  . Smokeless tobacco: Never Used  . Tobacco comment: quit yrs ago  Substance Use Topics  . Alcohol use: No    Comment: occasion   . Drug use: No    Prior to Admission medications   Medication Sig Start Date End Date Taking? Authorizing Provider  acetaminophen (TYLENOL) 500 MG tablet Take 2 tablets (1,000 mg total) by mouth every 8 (eight) hours as needed for mild pain or moderate pain. 02/03/18  Yes Alfonse Spruce, FNP  aspirin EC 81 MG tablet Take 1 tablet (81 mg total) by mouth daily. 11/05/19  Yes [name], MD  atorvastatin (LIPITOR) 20 MG tablet Take 1 tablet (20 mg total) by mouth daily. 02/09/20  Yes [name], MD  cyclobenzaprine (FLEXERIL) 5 MG tablet Take 1 tablet (5 mg total) by mouth 2 (two) times daily as needed for muscle spasms. 12/08/19  Yes [name], MD  DULoxetine (CYMBALTA) 20 MG capsule TAKE 1 CAPSULE (20 MG TOTAL) BY MOUTH DAILY. 02/12/20  Yes [name], MD  ezetimibe (ZETIA) 10 MG tablet Take 10 mg by mouth daily. 02/12/20  Yes [provider]  gabapentin (NEURONTIN) 300 MG capsule Take 1 capsule (300 mg total) by mouth at bedtime. 08/27/19  Yes [name], MD  hydrochlorothiazide (HYDRODIURIL) 12.5 MG tablet Take 1 tablet (12.5 mg total) by mouth daily. 08/27/19  Yes [name], MD  insulin aspart (NOVOLOG FLEXPEN) 100 UNIT/ML FlexPen 7 units subcut with breakfast and dinner Patient taking differently: Inject 7 Units into the skin 2 (two) times daily. 7 units subcut with breakfast and dinner 08/27/19  Yes [name], MD  Insulin Glargine (LANTUS SOLOSTAR) 100 UNIT/ML Solostar Pen Inject 60 Units into  the skin at bedtime. 02/09/20  Yes [name], MD  lisinopril (ZESTRIL) 40 MG tablet TAKE 1 TABLET (40 MG TOTAL) BY MOUTH DAILY. 02/12/20  Yes [name], MD  omeprazole (PRILOSEC) 20 MG capsule TAKE 1 CAPSULE (20 MG TOTAL) BY MOUTH DAILY. 06/24/19  Yes [name], MD  risperiDONE (RISPERDAL) 2 MG tablet Take 1 tablet (2 mg total) by mouth at bedtime. 02/28/20  Yes [name], MD  senna-docusate (SENOKOT S) 8.6-50 MG tablet Take 1 tablet by mouth daily as needed for moderate constipation. 11/08/19  Yes [name], MD  traZODone (DESYREL) 50 MG tablet TAKE 1/2-1 TABLETS (25-50 MG TOTAL) BY MOUTH AT BEDTIME AS NEEDED FOR SLEEP. MUST MAKE APPT FOR FURTHER REFILLS Patient taking differently: Take 25-50 mg by mouth at bedtime as needed for sleep.  03/09/19  Yes [name], MD  Blood Glucose Monitoring Suppl (TRUE METRIX METER) w/Device KIT Use as directed 11/08/19   [name], MD  diclofenac sodium (VOLTAREN) 1 % GEL Apply 4 g topically 4 (four) times daily. Patient not taking: Reported on 02/29/2020 09/14/16   Boykin Nearing, MD  Insulin Pen Needle (PEN NEEDLES) 32G X 4 MM MISC 1 each by Does not apply route 4 (four) times daily. 12/04/16   Funches, Adriana Mccallum, MD  Insulin NPH Isophane & Regular (RELION 70/30 [place]) Inject 35 Units into the skin 2 (two) times daily.  08/17/14  [provider]    Current Facility-Administered Medications  Medication Dose Route Frequency Provider Last Rate Last Admin  . 0.9 %  sodium chloride infusion   Intravenous Continuous Kayleen Memos, DO 75 mL/hr at 03/03/20 0026 New Bag at 03/03/20 0026  . acetaminophen (TYLENOL) tablet 650 mg  650 mg Oral Q6H PRN Irene Pap N, DO      . amLODipine (NORVASC) tablet 10 mg  10 mg Oral Daily Irene Pap N, DO   10 mg at 03/02/20 0931  . aspirin EC tablet 81 mg  81 mg Oral Daily Irene Pap N, DO   81 mg at 03/02/20 1017  . atorvastatin (LIPITOR) tablet 20 mg  20 mg Oral Daily  [place], Carole N, DO   20 mg at 03/02/20 1017  . bisacodyl (DULCOLAX) suppository 10 mg  10 mg Rectal Daily PRN Irene Pap N, DO      . Chlorhexidine Gluconate Cloth 2 % PADS 6 each  6 each Topical Daily Irene Pap N, DO   6 each at 03/01/20 1200  . diphenhydrAMINE (BENADRYL) capsule 25 mg  25 mg Oral Q8H PRN Irene Pap N, DO      . DULoxetine (CYMBALTA) DR capsule 20 mg  20 mg Oral Daily [place], Carole N, DO   20 mg at 03/02/20 1147  . enoxaparin (LOVENOX) injection 40 mg  40 mg Subcutaneous Q24H Irene Pap N, DO   40 mg at 03/02/20 2144  . ezetimibe (ZETIA) tablet 10 mg  10 mg Oral Daily Irene Pap N, DO   10 mg at 03/02/20 1016  . insulin aspart (novoLOG) injection 0-9 Units  0-9 Units Subcutaneous Q4H Kayleen Memos, DO   2 Units at 03/02/20 2333  . metoprolol tartrate (LOPRESSOR) injection 5 mg  5 mg Intravenous Q5 min PRN Lang Snow, FNP   5 mg at 03/01/20 1402  . metoprolol tartrate (LOPRESSOR) tablet 12.5 mg  12.5 mg Oral BID Irene Pap N, DO   12.5 mg at 03/02/20 2144  . ondansetron (ZOFRAN) injection 4 mg  4 mg Intravenous Q6H PRN Irene Pap N, DO   4 mg at 03/02/20 0926  . oxyCODONE (Oxy IR/ROXICODONE) immediate release tablet 5 mg  5 mg Oral Q6H PRN Irene Pap N, DO   5 mg at 03/02/20 1906  . pantoprazole (PROTONIX) injection 40 mg  40 mg Intravenous Q12H Irene Pap N, DO   40 mg at 03/02/20 2144  . risperiDONE (RISPERDAL) tablet 2 mg  2 mg Oral QHS [place], Carole N, DO   2 mg at 03/02/20 2144  . senna-docusate (Senokot-S) tablet 2 tablet  2 tablet Oral BID Irene Pap N, DO   2 tablet at 03/02/20 2144  . sodium chloride flush (NS) 0.9 % injection 10-40 mL  10-40 mL Intracatheter PRN Irene Pap N, DO      . traZODone (DESYREL) tablet  50 mg  50 mg Oral QHS PRN Kayleen Memos, DO   50 mg at 03/01/20 2131    Allergies as of 02/29/2020 - Review Complete 02/29/2020  Allergen Reaction Noted  . Hydralazine hcl  11/08/2019  . Hydrocodone Other (See Comments) 11/17/2013    . Metformin and related Nausea And Vomiting and Other (See Comments) 09/21/2014  . Plaquenil [hydroxychloroquine sulfate] Hives 10/07/2018  . Sulfur Hives 11/17/2013     Review of Systems:    Constitutional: No weight loss, fever or chills Skin: No rash  Cardiovascular: No chest pain   Respiratory: No SOB  Gastrointestinal: See HPI and otherwise negative Genitourinary: No dysuria  Neurological: No headache, dizziness or syncope Musculoskeletal: No new muscle or joint pain Hematologic: No bleeding Psychiatric: No history of depression or anxiety    Physical Exam:  Vital signs in last 24 hours: Temp:  [97.4 F (36.3 C)-98.4 F (36.9 C)] 97.6 F (36.4 C) (03/18 0409) Pulse Rate:  [77-84] 77 (03/18 0409) Resp:  [14-18] 14 (03/18 0409) BP: (104-136)/(71-89) 104/71 (03/18 0409) SpO2:  [89 %-99 %] 99 % (03/18 0411) Last BM Date: (2 weeks ago) General:   Pleasant overweight AA female appears to be in NAD, Well developed, Well nourished, alert and cooperative Head:  Normocephalic and atraumatic. Eyes:   PEERL, EOMI. No icterus. Conjunctiva pink. Ears:  Normal auditory acuity. Neck:  Supple Throat: Oral cavity and pharynx without inflammation, swelling or lesion.  Lungs: Respirations even and unlabored. Lungs clear to auscultation bilaterally.   No wheezes, crackles, or rhonchi.  Heart: Normal S1, S2. No MRG. Regular rate and rhythm. No peripheral edema, cyanosis or pallor.  Abdomen:  Soft, nondistended, maybe mild epigastric ttp,  (but no real pain when patient is distracted during exam). No rebound or guarding. Normal bowel sounds. No appreciable masses or hepatomegaly. Rectal:  Not performed.  Msk:  Symmetrical without gross deformities. Peripheral pulses intact.  Extremities:  Without edema, no deformity or joint abnormality.  Neurologic:  Alert and  oriented x4;  grossly normal neurologically.  Skin:   Dry and intact without significant lesions or rashes. Psychiatric:  Demonstrates good judgement and reason without abnormal affect or behaviors.Does ask a lot of clarifying questions after already stating that she understands   LAB RESULTS: Recent Labs    02/29/20 1442 03/01/20 1010 03/03/20 0341  WBC 13.3* 13.5* 10.9*  HGB 13.4 11.5* 9.7*  HCT 41.8 35.5* 31.4*  PLT 290 254 222   BMET Recent Labs    02/29/20 1315 03/01/20 1010 03/03/20 0341  NA 139 140 139  K 4.9 4.4 4.3  CL 100 107 107  CO2 _0 GLUCOSE 249* 178* 105*  BUN 23* 27* 36*  CREATININE 1.20* 1.31* 1.72*  CALCIUM 9.6 8.9 8.4*   LFT Recent Labs    03/03/20 0341  PROT 6.3*  ALBUMIN 3.0*  AST 11*  ALT 13  ALKPHOS 44  BILITOT 0.4    STUDIES: MR 3D Recon At Scanner  Result Date: 03/02/2020 CLINICAL DATA:  Acute abdominal pain. Nausea and vomiting. Possible pancreatic mass on recent CT EXAM: MRI ABDOMEN WITHOUT AND WITH CONTRAST (INCLUDING MRCP) TECHNIQUE: Multiplanar multisequence MR imaging of the abdomen was performed both before and after the administration of intravenous contrast. Heavily T2-weighted images of the biliary and pancreatic ducts were obtained, and three-dimensional MRCP images were rendered by post processing. CONTRAST:  14m GADAVIST GADOBUTROL 1 MMOL/ML IV SOLN COMPARISON:  CT on 02/29/2020 FINDINGS: Lower chest: No acute  findings. Hepatobiliary: No hepatic masses identified. Layering sludge is noted in the gallbladder, but there is no evidence of cholecystitis or biliary ductal dilatation. Pancreas: Swelling of the pancreatic head is seen with minimal adjacent peripancreatic edema/fluid, consistent with focal pancreatitis. No pancreatic mass identified. No evidence of pancreatic ductal dilatation. No evidence of pancreatic necrosis or peripancreatic pseudocysts. Spleen:  Within normal limits in size and appearance. Adrenals/Urinary Tract: No masses identified. No evidence of hydronephrosis. Stomach/Bowel: Visualized portion unremarkable. Vascular/Lymphatic:  No pathologically enlarged lymph nodes identified. No abdominal aortic aneurysm. Other:  None. Musculoskeletal:  No suspicious bone lesions identified. IMPRESSION: 1. Focal acute pancreatitis involving the pancreatic head. No evidence of pancreatic necrosis, mass, or ductal dilatation. 2. Gallbladder sludge, without radiographic evidence of cholecystitis or biliary ductal dilatation. Electronically Signed   By: Marlaine Hind M.D.   On: 03/02/2020 08:06   DG CHEST PORT 1 VIEW  Result Date: 03/01/2020 CLINICAL DATA:  Dyspnea EXAM: PORTABLE CHEST 1 VIEW COMPARISON:  10/09/2019 FINDINGS: Cardiomediastinal contours accentuated by portable technique potentially mildly enlarged. Hilar structures are normal. Lungs are clear. No acute bone findings. Areas of subtle opacity project over left cardiac border and to the right of cardiac border likely external to the patient. IMPRESSION: No acute cardiopulmonary disease.  Mild cardiomegaly. Electronically Signed   By: Zetta Bills M.D.   On: 03/01/2020 10:23   MR ABDOMEN MRCP W WO CONTAST  Result Date: 03/02/2020 CLINICAL DATA:  Acute abdominal pain. Nausea and vomiting. Possible pancreatic mass on recent CT EXAM: MRI ABDOMEN WITHOUT AND WITH CONTRAST (INCLUDING MRCP) TECHNIQUE: Multiplanar multisequence MR imaging of the abdomen was performed both before and after the administration of intravenous contrast. Heavily T2-weighted images of the biliary and pancreatic ducts were obtained, and three-dimensional MRCP images were rendered by post processing. CONTRAST:  16m GADAVIST GADOBUTROL 1 MMOL/ML IV SOLN COMPARISON:  CT on 02/29/2020 FINDINGS: Lower chest: No acute findings. Hepatobiliary: No hepatic masses identified. Layering sludge is noted in the gallbladder, but there is no evidence of cholecystitis or biliary ductal dilatation. Pancreas: Swelling of the pancreatic head is seen with minimal adjacent peripancreatic edema/fluid, consistent with focal pancreatitis. No  pancreatic mass identified. No evidence of pancreatic ductal dilatation. No evidence of pancreatic necrosis or peripancreatic pseudocysts. Spleen:  Within normal limits in size and appearance. Adrenals/Urinary Tract: No masses identified. No evidence of hydronephrosis. Stomach/Bowel: Visualized portion unremarkable. Vascular/Lymphatic: No pathologically enlarged lymph nodes identified. No abdominal aortic aneurysm. Other:  None. Musculoskeletal:  No suspicious bone lesions identified. IMPRESSION: 1. Focal acute pancreatitis involving the pancreatic head. No evidence of pancreatic necrosis, mass, or ductal dilatation. 2. Gallbladder sludge, without radiographic evidence of cholecystitis or biliary ductal dilatation. Electronically Signed   By: JMarlaine HindM.D.   On: 03/02/2020 08:06   [place] SITE RITE  Result Date: 03/01/2020 If Site Rite image not attached, placement could not be confirmed due to current cardiac rhythm.   Impression / Plan:   Impression: 1.  Suspected acute gallstone pancreatitis as evidenced by CT scan: Initial CT scan with evidence of acute pancreatitis and layering high density material within the gallbladder within elevated lipase of 54, LFTs normal, patient still with reported epigastric pain (though minimal on exam), history of diagnosis of gastroparesis in 2015 but with only borderline abnormal gastric emptying study and no symptoms reported over the past 4 years from this; suspect this is all pancreatitis 2.  Leukocytosis: Improving now 3.  Chronic constipation: Patient typically uses Senokot and Citrucel at  home but this has not been working over the past 2 weeks; may be contributing to abdominal discomfort and nausea 4.  CKD stage III  Plan: 1.  Continue antiemetics and analgesics 2.  Will discuss possible EGD with Dr. [name].  I am not sure that she necessarily needs this but it may help to provide clarity towards a diagnosis of pancreatitis.  Did go ahead and discussed  risks, benefits, limitations and alternatives and the patient agrees to proceed. 3.  Patient does have diagnosis of gastroparesis in the past but with only borderline abnormal imaging and no reported symptoms of nausea vomiting or chronic abdominal pain over the past 4 years, this is less likely.  Could consider repeat gastric emptying study at some point to clarify, likely not during this admission with all the meds on board. 4.  Patient to remain n.p.o. for now.  If she does not have procedure today could proceed with clear liquids today. 5.  Please await further recommendations from Dr. [name] later today.  Thank you for your kind consultation, we will continue to follow.  Lavone Nian [facility]  03/03/2020, 8:36 AM  Addendum: 10  Spoke with Dr. [name]. Plans to proceed with EGD. This will be scheduled for tomorrow since patient has not been COVID tested.  Ordered COVID testing today.  Ellouise Newer, PA-C

## 2020-03-03 NOTE — Progress Notes (Addendum)
Patient has only voided 150 mls in the last 12 hours, bladder scanner showed 184 mls. She is currently NPO. NP Sharlet Salina was notified. Per Sharlet Salina wait till the doctor sees the patient this morning to address her urine output.

## 2020-03-03 NOTE — Progress Notes (Signed)
Physical Therapy Treatment Patient Details Name: Nancy [name] MRN: [id number] DOB: 1961/07/26 Today's Date: 03/03/2020    History of Present Illness 59 year old female admitted with N/V. ? of acute pancreatitis. PMH includes essential HTN, CVA, Right carotid artery stenosis. Diabetic gastroparesis, neoplasm of partotid gland. Has cataracts (pending surgery)    PT Comments    Patient resting in bed with spouse visiting at bedside. Excellent session, and significant improvement overall. She no longer was exhibiting "whispering" when communicating. No complaints of nausea, and indicating that her abdominal pain was so much less. Scheduled for EGD today. Required assist of 2 for bed mobility- has difficulty with log rolling. Once in seated on edge of bed, able to sit 5 minutes- erect with no complaints of dizziness. Able to ambulate in hallway with RW, recliner behind her, 120 feet - once back in room positioned in BS chair- reviewed all LE /UE ex as per printed HEP issued yesterday. Good return demo. At end of session, left in chair, remote/call light , and telephone in reach. LE elevated- covers in place, BS table over her lap. Should continue to benefit from PT for optimal functional outcomes. She is very receptive to PT and wants very much to be considered for [facility] - definitely could benefit from PT and OT in such a setting.   Follow Up Recommendations  CIR     Equipment Recommendations  Rolling walker with 5" wheels    Recommendations for Other Services       Precautions / Restrictions Precautions Precautions: Fall Precaution Comments: Good session today- still at risk for falls but significant improvement. Restrictions Weight Bearing Restrictions: No    Mobility  Bed Mobility Overal bed mobility: Needs Assistance Bed Mobility: Supine to Sit     Supine to sit: Min assist;HOB elevated     General bed mobility comments: Has difficulty with log rolling to sit - to protect  back and abdomen. Able to sit erect on edge of bed fo 5 minutes with no complaints of pain or dizziness.  Transfers Overall transfer level: Needs assistance Equipment used: Rolling walker (2 wheeled);Straight cane Transfers: Sit to/from [facility] Sit to Stand: Min guard;Min assist;From elevated surface;+2 physical assistance;+2 safety/equipment Stand pivot transfers: Min guard;Min assist;From elevated surface;+2 safety/equipment          Ambulation/Gait Ambulation/Gait assistance: Min assist;+2 physical assistance;Mod assist Gait Distance (Feet): 120 Feet Assistive device: Rolling walker (2 wheeled)(followed with BS chair (recliner)) Gait Pattern/deviations: Wide base of support;Shuffle   Gait velocity interpretation: 1.31 - 2.62 ft/sec, indicative of limited community ambulator General Gait Details: Unsteady- but improving overall   Stairs             Wheelchair Mobility    Modified Rankin (Stroke Patients Only)       Balance Overall balance assessment: Needs assistance Sitting-balance support: Feet supported;No upper extremity supported Sitting balance-Leahy Scale: Good     Standing balance support: Single extremity supported Standing balance-Leahy Scale: Fair(F+=G-) Standing balance comment: F+,G-                            Cognition Arousal/Alertness: Awake/alert Behavior During Therapy: (less flat today and no longer whispering in converstaion.) Overall Cognitive Status: (Husband present = visiting at bedside)                                 General  Comments: Much more oriented overall- Does present with reduced understanding of her diabetes- had long discussion with patient and spouse- seems her average BS is often 298!- but she admitted to have been taking the same amount of insulin no matter what her BS range was.      Exercises General Exercises - Lower Extremity Ankle Circles/Pumps: Seated;AROM;Both;10  reps Quad Sets: AROM;Seated Gluteal Sets: AROM;Seated;Both;10 reps Short Arc Quad: AROM;Seated;Both;10 reps Heel Slides: AROM;Seated;Both;10 reps Hip ABduction/ADduction: AROM;10 reps;Both;Seated Hip Flexion/Marching: AROM;Seated;10 reps Other Exercises Other Exercises: Reviewed all LE format and she was able to do good return demo on all.    General Comments General comments (skin integrity, edema, etc.): Monitor skin and joint integrity closely- has wounds on LEFT foot/heel, dressings in place      Pertinent Vitals/Pain Pain Assessment: 0-10 Pain Score: 2  Faces Pain Scale: Hurts little more Pain Location: abdomen Pain Descriptors / Indicators: Guarding    Home Living                      Prior Function            PT Goals (current goals can now be found in the care plan section) Acute Rehab PT Goals Patient Stated Goal: Feel better and be able to go home Time For Goal Achievement: 03/16/20 Potential to Achieve Goals: Good Progress towards PT goals: Progressing toward goals    Frequency    Min 3X/week      PT Plan      Co-evaluation              AM-PAC PT "6 Clicks" Mobility   Outcome Measure  Help needed turning from your back to your side while in a flat bed without using bedrails?: A Little Help needed moving from lying on your back to sitting on the side of a flat bed without using bedrails?: A Little Help needed moving to and from a bed to a chair (including a wheelchair)?: A Little Help needed standing up from a chair using your arms (e.g., wheelchair or bedside chair)?: A Lot Help needed to walk in hospital room?: A Lot Help needed climbing 3-5 steps with a railing? : A Lot 6 Click Score: 15    End of Session   Activity Tolerance: Patient tolerated treatment well Patient left: in chair;with call bell/phone within reach;with chair alarm set   PT Visit Diagnosis: Unsteadiness on feet (R26.81);Muscle weakness (generalized)  (M62.81);Other abnormalities of gait and mobility (R26.89)     Time: 7948-0165 PT Time Calculation (min) (ACUTE ONLY): 46 min  Charges:  $Gait Training: 8-22 mins $Therapeutic Exercise: 8-22 mins $Therapeutic Activity: 8-22 mins                    Rollen Sox, PT # [phone] CGV cell  Casandra Doffing 03/03/2020, 12:58 PM

## 2020-03-03 NOTE — NC FL2 (Signed)
[place] LEVEL OF CARE SCREENING TOOL     IDENTIFICATION  Patient Name: Nancy [name] Birthdate: [birth year] Sex: female Admission Date (Current Location): 02/29/2020  [facility] and [place] Number:  Herbalist and Address:  [facility],  [address] [place], [place]      Provider Number: [id number]  Attending Physician Name and Address:  Kayleen Memos, DO  Relative Name and Phone Number:     Isabelly, Kobler [phone]  [phone]       Current Level of Care: Hospital Recommended Level of Care: [place] Prior Approval Number:    Date Approved/Denied:   PASRR Number:    Discharge Plan: SNF    Current Diagnoses: Patient Active Problem List   Diagnosis Date Noted  . Generalized abdominal pain   . Acute pancreatitis 02/29/2020  . Cerebrovascular accident (CVA) ([place]) 11/08/2019  . Stenosis of right carotid artery 11/08/2019  . Hyperlipidemia associated with type 2 diabetes mellitus ([place]) 11/08/2019  . Cortical age-related cataract of both eyes 08/30/2019  . Gait abnormality 08/30/2019  . Statin declined 08/30/2019  . Gastroesophageal reflux disease without esophagitis 04/18/2018  . 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 Uncontrolled type 2 diabetes mellitus with diabetic neuropathy, with long-term current use of insulin ([place]) 10/29/2017  . History of macular degeneration 10/29/2017  . Chronic cervical pain 10/29/2016  . Myalgia 09/14/2016  . Insomnia 09/14/2016  . Tinea pedis of both feet 08/20/2016  . Macular degeneration 08/17/2016  . Low back pain 09/21/2014  . Hypertension 09/21/2014  . Diabetic gastroparesis associated with type 2 diabetes mellitus ([place]) 09/14/2014  . Thyroid nodule 08/17/2014  . Obesity (BMI 30-39.9) 08/17/2014  . Hyperlipidemia 08/05/2014  . Type II diabetes mellitus with neurological manifestations, uncontrolled ([place]) 08/04/2014  . Elevated serum protein level 08/04/2014     Orientation RESPIRATION BLADDER Height & Weight     Self, Time, Situation, Place  Normal Continent Weight: 202 lb (91.6 kg) Height:  5\' 3"  (160 cm)  BEHAVIORAL SYMPTOMS/MOOD NEUROLOGICAL BOWEL NUTRITION STATUS      Continent (See discharge Summary)  AMBULATORY STATUS COMMUNICATION OF NEEDS Skin   Extensive Assist Verbally PU Stage and Appropriate Care(Left Heel Wound)                       Personal Care Assistance Level of Assistance  Bathing, Dressing, Feeding Bathing Assistance: Limited assistance Feeding assistance: Independent Dressing Assistance: Limited assistance     Functional Limitations Info  Sight, Hearing, Speech Sight Info: Adequate Hearing Info: Adequate Speech Info: Adequate    SPECIAL CARE FACTORS FREQUENCY  PT (By licensed PT), OT (By licensed OT)     PT Frequency: 5x/week OT Frequency: 5x/week            Contractures Contractures Info: Not present    Additional Factors Info  Insulin Sliding Scale Code Status Info: Fullcode Allergies Info: Allergies: Hydralazine Hcl, Hydrocodone, Metformin And Related, Plaquenil Hydroxychloroquine Sulfate, Sulfur   Insulin Sliding Scale Info: insulin aspart (novoLOG) injection 0-9 Units , Every 4 hours       Current Medications (03/03/2020):  This is the current hospital active medication list Current Facility-Administered Medications  Medication Dose Route Frequency Provider Last Rate Last Admin  . 0.9 %  sodium chloride infusion   Intravenous Continuous Kayleen Memos, DO 75 mL/hr at 03/03/20 0026 New Bag at 03/03/20 0026  . acetaminophen (TYLENOL) tablet 650 mg  650 mg Oral Q6H PRN Irene Pap  N, DO      . amLODipine (NORVASC) tablet 10 mg  10 mg Oral Daily Irene Pap N, DO   10 mg at 03/03/20 0913  . aspirin EC tablet 81 mg  81 mg Oral Daily Irene Pap N, DO   81 mg at 03/03/20 0913  . atorvastatin (LIPITOR) tablet 20 mg  20 mg Oral Daily Irene Pap N, DO   20 mg at 03/03/20 0912  . bisacodyl  (DULCOLAX) suppository 10 mg  10 mg Rectal Daily PRN Irene Pap N, DO      . Chlorhexidine Gluconate Cloth 2 % PADS 6 each  6 each Topical Daily Kayleen Memos, DO   6 each at 03/03/20 0913  . diphenhydrAMINE (BENADRYL) capsule 25 mg  25 mg Oral Q8H PRN Irene Pap N, DO      . DULoxetine (CYMBALTA) DR capsule 20 mg  20 mg Oral Daily Irene Pap N, DO   20 mg at 03/03/20 0912  . enoxaparin (LOVENOX) injection 40 mg  40 mg Subcutaneous Q24H Irene Pap N, DO   40 mg at 03/02/20 2144  . ezetimibe (ZETIA) tablet 10 mg  10 mg Oral Daily Irene Pap N, DO   10 mg at 03/03/20 0912  . insulin aspart (novoLOG) injection 0-9 Units  0-9 Units Subcutaneous Q4H Kayleen Memos, DO   2 Units at 03/02/20 2333  . metoprolol tartrate (LOPRESSOR) injection 5 mg  5 mg Intravenous Q5 min PRN Lang Snow, FNP   5 mg at 03/01/20 1402  . metoprolol tartrate (LOPRESSOR) tablet 12.5 mg  12.5 mg Oral BID Irene Pap N, DO   12.5 mg at 03/03/20 0913  . ondansetron (ZOFRAN) injection 4 mg  4 mg Intravenous Q6H PRN Irene Pap N, DO   4 mg at 03/02/20 0926  . oxyCODONE (Oxy IR/ROXICODONE) immediate release tablet 5 mg  5 mg Oral Q6H PRN Irene Pap N, DO   5 mg at 03/02/20 1906  . pantoprazole (PROTONIX) injection 40 mg  40 mg Intravenous Q12H Hall, Archie Patten N, DO   40 mg at 03/03/20 0913  . risperiDONE (RISPERDAL) tablet 2 mg  2 mg Oral QHS Hall, Carole N, DO   2 mg at 03/02/20 2144  . senna-docusate (Senokot-S) tablet 2 tablet  2 tablet Oral BID Irene Pap N, DO   2 tablet at 03/03/20 0912  . sodium chloride flush (NS) 0.9 % injection 10-40 mL  10-40 mL Intracatheter PRN Kayleen Memos, DO      . traZODone (DESYREL) tablet 50 mg  50 mg Oral QHS PRN Irene Pap N, DO   50 mg at 03/01/20 2131     Discharge Medications: Please see discharge summary for a list of discharge medications.  Relevant Imaging Results:  Relevant Lab Results:   Additional Information [id number]  Lia Hopping, LCSW

## 2020-03-03 NOTE — Op Note (Signed)
[facility] Patient Name: Nancy [name] Procedure Date: 03/03/2020 MRN: [id number] Attending MD: Jerene Bears , MD Date of Birth: [birth year] CSN: [id number] Age: 59 Admit Type: Inpatient Procedure:                Upper GI endoscopy Indications:              Epigastric abdominal pain, Nausea with vomiting,                            recent pancreatitis Providers:                Lajuan Lines. [name], MD, Glori Bickers, RN, Lina Sar,                            Technician, Adair Laundry, CRNA Referring MD:             [facility] Medicines:                Monitored Anesthesia Care Complications:            No immediate complications. Estimated Blood Loss:     Estimated blood loss: none. Procedure:                Pre-Anesthesia Assessment:                           - Prior to the procedure, a History and Physical                            was performed, and patient medications and                            allergies were reviewed. The patient's tolerance of                            previous anesthesia was also reviewed. The risks                            and benefits of the procedure and the sedation                            options and risks were discussed with the patient.                            All questions were answered, and informed consent                            was obtained. Prior Anticoagulants: The patient has                            taken no previous anticoagulant or antiplatelet                            agents. ASA Grade Assessment: II - A patient with  mild systemic disease. After reviewing the risks                            and benefits, the patient was deemed in                            satisfactory condition to undergo the procedure.                           After obtaining informed consent, the endoscope was                            passed under direct vision. Throughout the             procedure, the patient's blood pressure, pulse, and                            oxygen saturations were monitored continuously. The                            [id number]-H190 ([id number]) Olympus gastroscope was                            introduced through the mouth, and advanced to the                            second part of duodenum. The upper GI endoscopy was                            accomplished without difficulty. The patient                            tolerated the procedure well. Scope In: Scope Out: Findings:      The esophagus was normal.      The stomach was normal.      The examined duodenum was normal. Impression:               - Normal esophagus.                           - Normal stomach.                           - Normal examined duodenum.                           - No specimens collected. Moderate Sedation:      N/A Recommendation:           - Return patient to hospital ward for ongoing care.                           - Advance diet as tolerated.                           - Continue present medications and supportive care.                           -  Outpatient surgical consultation for                            consideration of cholecystectomy given recent                            pancreatitis. Procedure Code(s):        --- Professional ---                           [phone], Esophagogastroduodenoscopy, flexible,                            transoral; diagnostic, including collection of                            specimen(s) by brushing or washing, when performed                            (separate procedure) Diagnosis Code(s):        --- Professional ---                           R10.13, Epigastric pain                           R11.2, Nausea with vomiting, unspecified CPT copyright 2019 American Medical Association. All rights reserved. The codes documented in this report are preliminary and upon coder review may  be revised to meet current compliance  requirements. Jerene Bears, MD 03/03/2020 4:07:24 PM This report has been signed electronically. Number of Addenda: 0

## 2020-03-04 ENCOUNTER — Encounter: Payer: Self-pay | Admitting: *Deleted

## 2020-03-04 ENCOUNTER — Inpatient Hospital Stay (HOSPITAL_COMMUNITY): Payer: 59

## 2020-03-04 LAB — CREATININE, SERUM
Creatinine, Ser: 1.09 mg/dL — ABNORMAL HIGH (ref 0.44–1.00)
GFR calc Af Amer: >60 mL/min (ref >60)
GFR calc non Af Amer: 56 mL/min — ABNORMAL LOW (ref >60)

## 2020-03-04 LAB — GLUCOSE, CAPILLARY
Glucose-Capillary: 113 mg/dL — ABNORMAL HIGH (ref 70–99)
Glucose-Capillary: 132 mg/dL — ABNORMAL HIGH (ref 70–99)
Glucose-Capillary: 148 mg/dL — ABNORMAL HIGH (ref 70–99)
Glucose-Capillary: 192 mg/dL — ABNORMAL HIGH (ref 70–99)
Glucose-Capillary: 202 mg/dL — ABNORMAL HIGH (ref 70–99)
Glucose-Capillary: 214 mg/dL — ABNORMAL HIGH (ref 70–99)
Glucose-Capillary: 79 mg/dL (ref 70–99)
Glucose-Capillary: 87 mg/dL (ref 70–99)

## 2020-03-04 MED ORDER — PANTOPRAZOLE SODIUM 40 MG PO TBEC
40.0000 mg | DELAYED_RELEASE_TABLET | Freq: Two times a day (BID) | ORAL | Status: DC
  Administered 2020-03-04 – 2020-03-06 (×4): 40 mg via ORAL
  Filled 2020-03-04 (×4): qty 1

## 2020-03-04 MED ORDER — METOPROLOL TARTRATE 25 MG PO TABS
25.0000 mg | ORAL_TABLET | Freq: Two times a day (BID) | ORAL | Status: DC
  Administered 2020-03-04 – 2020-03-07 (×6): 25 mg via ORAL
  Filled 2020-03-04 (×7): qty 1

## 2020-03-04 MED ORDER — METOPROLOL TARTRATE 12.5 MG HALF TABLET
12.5000 mg | ORAL_TABLET | Freq: Once | ORAL | Status: AC
  Administered 2020-03-04: 12.5 mg via ORAL
  Filled 2020-03-04: qty 1

## 2020-03-04 NOTE — Progress Notes (Signed)
The patient is receiving Protonix by the intravenous route.  Based on criteria approved by the Pharmacy and [place], the medication is being converted to the equivalent oral dose form.  These criteria include: -No active GI bleeding -Able to tolerate diet of full liquids (or better) or tube feeding -Able to tolerate other medications by the oral or enteral route  If you have any questions about this conversion, please contact the Pharmacy Department (phone [phone]).  Thank you.  Minda Ditto PharmD 03/04/2020, 11:31 AM

## 2020-03-04 NOTE — Progress Notes (Addendum)
Transition of Care (TOC) -30 day Note       Patient Details  Name: Nancy [name]. Nancy [name]  UQJ:[id number] Date of Birth:September 01, 1961   Transition of Care [facility]) CM/SW Contact  Name: Kathrin Greathouse Phone Number: [phone] Date:03/04/2020 Time:8:50am   MUST ID:    To Whom it May Concern:   Please be advised that the above patient will require a short-term nursing home stay, anticipated 30 days or less rehabilitation and strengthening. The plan is for return home.

## 2020-03-04 NOTE — Progress Notes (Signed)
PROGRESS NOTE  Nancy [name] LKG:[id number] DOB: 09-02-1961 DOA: 02/29/2020 PCP: [name], MD  HPI/Recap of past 24 hours:  Nancy [name] is a 59 y.o. female with medical history significant for essential hypertension, CVA, right carotid artery stenosis, diabetic gastroparesis, type 2 diabetes, neoplasm of parotid gland, GERD, hyperlipidemia, gait abnormality who presented to [facility] ED with complaints of worsening nausea of 2 weeks duration.  Associated with vomiting and abdominal pain.  In the past 2 days she has not been able to keep anything down.  Vomits every time she eats.  Prior to presenting to the ED vomited 6 times and while in the ED 4 times.  Denies a personal history of gallstones or pancreatitis.  No use of alcoholic beverage.  No significant weight loss.  CT scan abdomen pelvis with contrast done in the ED showed evidence of acute pancreatitis.   TRH asked to admit.  Initial Lipase elevated at 54, normalized.  MRI abdomen negative for mass but showing acute pancreatitis.  Lab work unremarkable despite persistent symptomatology.  Last EGD 2015 by Dr. Paulita Fujita was normal.  [facility] consulted to assist with the management.  EGD completed 03/03/20 by Dr. [name]: normal.  General surgery contacted due to presumed gallstone pancreatitis.  03/04/20:  Seen and examined.  States she feels a lot better.  She denies nausea or abdominal pain at this time.  Tolerating a solid diet.     Assessment/Plan: Active Problems:   Acute pancreatitis   Generalized abdominal pain   Resolved Acute pancreatitis, presumed gallstone pancreatitis  As evidenced by symptomatology and positive CT scan and MRI for acute pancreatitis.   CT shows gallbladder sludge, gallstones could not be ruled out Negative lipase. LFTs normal, calcium, Tbili, triglycerides normal Lactic acid and procalcitonin negative Last EGD normal 2015, EGD 3/18 Dr. [name] normal Symptoms resolved and tolerating solid  diet General surgery contacted due to presumed gallstone pancreatitis. May need cholecystectomy.  Defer to general surgery to decide.  Resolving leukocytosis suspect reactive in the setting of intraabdominal pathology  WBC 13.3K>> 13.5K>.10.9K Afebrile, UA negative, CT lung field negative for lobular infiltrates Neutrophilia resolved Procalcitonin and lactic acid negative. No clear sources of active infective process.  AKI likely prerenal 2/2 to dehydration from poor oral intake Presented with elevated creatinine, peaked at 1.7. Repeated creatinine 1.09 after IV fluid hydration.  Encourage oral intake and avoid dehydration, nephrotoxins, and hypotension.  Hyperlipidemia Triglycerides normal Continue home medications  Type 2 diabetes with hyperglycemia Hemoglobin A1c 10.8 on 02/29/2020.  Uncontrolled hypertension  Blood pressure is not at goal Hold PTA HCTZ and lisinopril Continue norvasc 10 mg Added metoprolol 12.5 mg twice daily, increase dose to 25 mg twice daily.  Resolved sinus tachycardia likely in the setting of dehydration from poor oral intake Received IV fluid  Resolved chronic constipation Had a bowel movement 03/03/2020 Continue bowel regimen  Left heel wound, POA Wound care specialist for local wound care Continue local wound care  CKD 3A  History of CVA Continue home medications  Ambulatory dysfunction post CVA Uses a cane to ambulate at baseline PT OT recommend CIR or SNF Continue PT OT with assistance and fall precautions TOC assisting with placement   DVT prophylaxis: Sq Lovenox daily   Code Status: Full code per the patient herself  Family Communication: None at her bedside in the ED   Disposition Plan:  Patient is from home.  Anticipate discharge to home in the next 24 hours pending placement.  Consults called: GI    Objective: Vitals:   03/03/20 1620 03/03/20 1634 03/03/20 2048 03/04/20 0440  BP: (!) 179/87 (!) 155/81  (!) 168/94 (!) 157/84  Pulse: 87 87 88 82  Resp: 13 12 18 16   Temp:  97.9 F (36.6 C) 98.3 F (36.8 C) 98.6 F (37 C)  TempSrc:  Oral Oral Oral  SpO2: 99% 100% 100% 97%  Weight:      Height:        Intake/Output Summary (Last 24 hours) at 03/04/2020 1317 Last data filed at 03/04/2020 0944 Gross per 24 hour  Intake 1425.08 ml  Output 1400 ml  Net 25.08 ml   Filed Weights   02/29/20 1304 02/29/20 1954  Weight: 91.6 kg 91.6 kg    Exam:  . General: 59 y.o. year-old female R granulation no acute distress.  Alert and oriented x3.   . Cardiovascular: Regular rate and rhythm no rubs or gallops..   . Respiratory: Clear to auscultation no wheezes or Rales.   . Abdomen: Soft nontender normal bowel sounds present.   . Musculoskeletal: No lower extremity edema bilaterally.   Marland Kitchen Psychiatry: Mood is appropriate for condition and setting.    Data Reviewed: CBC: Recent Labs  Lab 02/29/20 1315 02/29/20 1442 03/01/20 1010 03/03/20 0341  WBC 12.8* 13.3* 13.5* 10.9*  NEUTROABS  --   --  10.8* 4.9  HGB 13.8 13.4 11.5* 9.7*  HCT 43.7 41.8 35.5* 31.4*  MCV 89.5 90.5 89.4 93.5  PLT 308 290 254 092   Basic Metabolic Panel: Recent Labs  Lab 02/29/20 1315 03/01/20 1010 03/03/20 0341 03/04/20 0337  NA 139 140 139  --   K 4.9 4.4 4.3  --   CL 100 107 107  --   CO2 22 23 25   --   GLUCOSE 249* 178* 105*  --   BUN 23* 27* 36*  --   CREATININE 1.20* 1.31* 1.72* 1.09*  CALCIUM 9.6 8.9 8.4*  --   MG  --   --  2.2  --    GFR: Estimated Creatinine Clearance: 59.7 mL/min (A) (by C-G formula based on SCr of 1.09 mg/dL (H)). Liver Function Tests: Recent Labs  Lab 02/29/20 1315 03/01/20 1010 03/03/20 0341  AST 19 11* 11*  ALT 18 14 13   ALKPHOS 68 57 44  BILITOT 0.9 0.5 0.4  PROT 8.3* 7.4 6.3*  ALBUMIN 4.1 3.4* 3.0*   Recent Labs  Lab 02/29/20 1315 02/29/20 1407 03/01/20 1010 03/02/20 0422 03/03/20 0341  LIPASE 54* 53* 33 21 20   No results for input(s): AMMONIA in the  last 168 hours. Coagulation Profile: No results for input(s): INR, PROTIME in the last 168 hours. Cardiac Enzymes: No results for input(s): CKTOTAL, CKMB, CKMBINDEX, TROPONINI in the last 168 hours. BNP (last 3 results) No results for input(s): PROBNP in the last 8760 hours. HbA1C: No results for input(s): HGBA1C in the last 72 hours. CBG: Recent Labs  Lab 03/03/20 2001 03/03/20 2359 03/04/20 0443 03/04/20 0732 03/04/20 1138  GLUCAP 173* 113* 87 79 202*   Lipid Profile: No results for input(s): CHOL, HDL, LDLCALC, TRIG, CHOLHDL, LDLDIRECT in the last 72 hours. Thyroid Function Tests: No results for input(s): TSH, T4TOTAL, FREET4, T3FREE, THYROIDAB in the last 72 hours. Anemia Panel: No results for input(s): VITAMINB12, FOLATE, FERRITIN, TIBC, IRON, RETICCTPCT in the last 72 hours. Urine analysis:    Component Value Date/Time   COLORURINE YELLOW 02/29/2020 [place] 02/29/2020 1315  LABSPEC 1.041 (H) 02/29/2020 1315   PHURINE 6.0 02/29/2020 1315   GLUCOSEU >=500 (A) 02/29/2020 1315   HGBUR NEGATIVE 02/29/2020 1315   BILIRUBINUR NEGATIVE 02/29/2020 1315   BILIRUBINUR negative 12/26/2017 1458   KETONESUR 20 (A) 02/29/2020 1315   PROTEINUR 100 (A) 02/29/2020 1315   UROBILINOGEN 0.2 12/26/2017 1458   UROBILINOGEN 0.2 09/07/2014 0857   NITRITE NEGATIVE 02/29/2020 1315   LEUKOCYTESUR NEGATIVE 02/29/2020 1315   Sepsis Labs: @LABRCNTIP (procalcitonin:4,lacticidven:4)  ) Recent Results (from the past 240 hour(s))  Respiratory Panel by RT PCR (Flu A&B, Covid) - Nasopharyngeal Swab     Status: None   Collection Time: 03/03/20 11:15 AM   Specimen: Nasopharyngeal Swab  Result Value Ref Range Status   SARS Coronavirus 2 by RT PCR NEGATIVE NEGATIVE Final    Comment: (NOTE) SARS-CoV-2 target nucleic acids are NOT DETECTED. The SARS-CoV-2 RNA is generally detectable in upper respiratoy specimens during the acute phase of infection. The lowest concentration of  SARS-CoV-2 viral copies this assay can detect is 131 copies/mL. A negative result does not preclude SARS-Cov-2 infection and should not be used as the sole basis for treatment or other patient management decisions. A negative result may occur with  improper specimen collection/handling, submission of specimen other than nasopharyngeal swab, presence of viral mutation(s) within the areas targeted by this assay, and inadequate number of viral copies (<131 copies/mL). A negative result must be combined with clinical observations, patient history, and epidemiological information. The expected result is Negative. Fact Sheet for Patients:  PinkCheek.be Fact Sheet for Healthcare Providers:  GravelBags.it This test is not yet ap proved or cleared by the [place] FDA and  has been authorized for detection and/or diagnosis of SARS-CoV-2 by FDA under an Emergency Use Authorization (EUA). This EUA will remain  in effect (meaning this test can be used) for the duration of the COVID-19 declaration under Section 564(b)(1) of the Act, 21 U.S.C. section 360bbb-3(b)(1), unless the authorization is terminated or revoked sooner.    Influenza A by PCR NEGATIVE NEGATIVE Final   Influenza B by PCR NEGATIVE NEGATIVE Final    Comment: (NOTE) The Xpert Xpress SARS-CoV-2/FLU/RSV assay is intended as an aid in  the diagnosis of influenza from Nasopharyngeal swab specimens and  should not be used as a sole basis for treatment. Nasal washings and  aspirates are unacceptable for Xpert Xpress SARS-CoV-2/FLU/RSV  testing. Fact Sheet for Patients: PinkCheek.be Fact Sheet for Healthcare Providers: GravelBags.it This test is not yet approved or cleared by the [place] FDA and  has been authorized for detection and/or diagnosis of SARS-CoV-2 by  FDA under an Emergency Use Authorization (EUA).  This EUA will remain  in effect (meaning this test can be used) for the duration of the  Covid-19 declaration under Section 564(b)(1) of the Act, 21  U.S.C. section 360bbb-3(b)(1), unless the authorization is  terminated or revoked. Performed at [facility], [place] [address] [place], [place] [address]       Studies: No results found.  Scheduled Meds: . amLODipine  10 mg Oral Daily  . aspirin EC  81 mg Oral Daily  . atorvastatin  20 mg Oral Daily  . Chlorhexidine Gluconate Cloth  6 each Topical Daily  . DULoxetine  20 mg Oral Daily  . enoxaparin (LOVENOX) injection  40 mg Subcutaneous Q24H  . ezetimibe  10 mg Oral Daily  . insulin aspart  0-9 Units Subcutaneous Q4H  . metoprolol tartrate  12.5 mg Oral BID  .  pantoprazole  40 mg Oral BID  . risperiDONE  2 mg Oral QHS  . senna-docusate  2 tablet Oral BID    Continuous Infusions:    LOS: 4 days     Kayleen Memos, MD [facility] Pager [phone]  If 7PM-7AM, please contact night-coverage [email or web address] Password [email or web address] 03/04/2020, 1:17 PM

## 2020-03-04 NOTE — Progress Notes (Signed)
Inpatient Rehabilitation Admissions Coordinator  Contacted by SW, Elmyra Ricks, that patient is medically ready for d/c. There is no bed available this week at [facility] CIR through the weekend for this patient and we have not yet completed full rehab assessment. I recommend pursuing inpt rehab at other local facilities at this time.   Danne Baxter, RN, MSN Rehab Admissions Coordinator [phone] 03/04/2020 10:45 AM

## 2020-03-04 NOTE — TOC Progression Note (Addendum)
Transition of Care [facility]) - Progression Note    Patient Details  Name: Nancy [name] MRN: [id number] Date of Birth: February 03, 1961  Transition of Care [facility]) CM/SW [place], [place] Phone Number: 03/04/2020, 11:47 AM  Clinical Narrative:    Patient agreeable for CSW to provide clinical information to other IP rehab facilities.  CSW simultaneously working on other CIR placement options and SNF options if CIR unable to accept.   CSW reached out to IP [facility]  Pam [phone] and faxed referral for review.   CSW reached out to [place] [phone] and faxed referral for review.   Patient first SNF choice [facility] unable to accept to due to insurance.   UPDATE:Patient does not want to go any other SNF, the patient  prefers to go home with therapy services if she cannot go to IP rehab. [facility] will not review until Monday. [facility] will notify csw soon. CSW will order 3in1 and RW if the patient goes home today.    3:31pm Patient is agreeable to [facility], SNF initiated authorization.    Expected Discharge Plan: IP Rehab Facility Barriers to Discharge: Insurance Authorization  Expected Discharge Plan and Services Expected Discharge Plan: [place] In-house Referral: Clinical Social Work   Post Acute Care Choice: [place] arrangements for the past 2 months: Single Family Home Expected Discharge Date: 03/04/20                                     Social Determinants of Health (SDOH) Interventions    Readmission Risk Interventions No flowsheet data found.

## 2020-03-04 NOTE — Progress Notes (Signed)
Occupational Therapy Treatment Patient Details Name: Nancy [name] MRN: [id number] DOB: 06/22/1961 Today's Date: 03/04/2020    History of present illness 59 year old female admitted with N/V. ? of acute pancreatitis. PMH includes essential HTN, CVA, Right carotid artery stenosis. Diabetic gastroparesis, neoplasm of partotid gland. Has cataracts (pending surgery)   OT comments  Pt. Seen for skilled OT treatment session.  Focus of session ADLs.  Pt. Able to ambulate to/from b.room for simulated toilet tasks, return demo of tub transfer both with MIN A.  Pt. Able to demonstrate LB dressing seated eob set up.    Follow Up Recommendations  Supervision/Assistance - 24 hour;SNF    Equipment Recommendations  3 in 1 bedside commode    Recommendations for Other Services      Precautions / Restrictions Precautions Precautions: Fall       Mobility Bed Mobility               General bed mobility comments: seated in chair beginning and end of session  Transfers Overall transfer level: Needs assistance Equipment used: Straight cane Transfers: Sit to/from Stand;Stand Pivot Transfers Sit to Stand: Min assist Stand pivot transfers: Min assist       General transfer comment: Used SPC (hers)    Balance                                           ADL either performed or assessed with clinical judgement   ADL Overall ADL's : Needs assistance/impaired             Lower Body Bathing: Set up;Sitting/lateral leans Lower Body Bathing Details (indicate cue type and reason): simulated during LB dressing task, educated on bringing legs toward her vs. leaning forward to reach feet for safety     Lower Body Dressing: Set up;Sitting/lateral leans Lower Body Dressing Details (indicate cue type and reason): good tech. with bringing legs up towards pt. to don and adj. socks Toilet Transfer: Minimal assistance;Ambulation Toilet Transfer Details (indicate cue type  and reason): amb. to b.room with SPC also noted pt. using intermittent furniture walking with L hand with R hand on the [facility]     Tub/ Shower Transfer: Tub transfer;Ambulation;Minimal assistance;Grab bars Tub/Shower Transfer Details (indicate cue type and reason): pt. simulated tub transfer with L faucet. used grab bars. reports she has grab bars at home too Functional mobility during ADLs: Minimal assistance General ADL Comments: pt. able to complete amb. to/from b.room for simulated toileting tasks, tub transfer, and back to chair.  LB dressing also for socks.     Vision       Perception     Praxis      Cognition Arousal/Alertness: Lethargic Behavior During Therapy: WFL for tasks assessed/performed Overall Cognitive Status: Within Functional Limits for tasks assessed                                          Exercises     Shoulder Instructions       General Comments      Pertinent Vitals/ Pain       Pain Assessment: No/denies pain  Home Living  Prior Functioning/Environment              Frequency  Min 2X/week        Progress Toward Goals  OT Goals(current goals can now be found in the care plan section)  Progress towards OT goals: Progressing toward goals     Plan Discharge plan remains appropriate    Co-evaluation                 AM-PAC OT "6 Clicks" Daily Activity     Outcome Measure   Help from another person eating meals?: None Help from another person taking care of personal grooming?: A Little Help from another person toileting, which includes using toliet, bedpan, or urinal?: A Lot Help from another person bathing (including washing, rinsing, drying)?: A Lot Help from another person to put on and taking off regular upper body clothing?: A Little Help from another person to put on and taking off regular lower body clothing?: A Lot 6 Click Score: 16    End of  Session Equipment Utilized During Treatment: Gait belt;Other (comment)(SPC)  OT Visit Diagnosis: Unsteadiness on feet (R26.81);Other abnormalities of gait and mobility (R26.89);Muscle weakness (generalized) (M62.81);Other symptoms and signs involving cognitive function;Pain;Cognitive communication deficit (R41.841)   Activity Tolerance Patient tolerated treatment well   Patient Left in chair;with call bell/phone within reach;with chair alarm set   Nurse Communication          Time: 7096-4383 OT Time Calculation (min): 8 min  Charges: OT General Charges $OT Visit: 1 Visit OT Treatments $Self Care/Home Management : 8-22 mins  [name], COTA/L Acute Rehabilitation [phone]   [name] 03/04/2020, 12:13 PM

## 2020-03-04 NOTE — Consult Note (Signed)
[facility] Surgery Consult Note  Nancy [name] [birth year]  [id number].    Requesting MD: [place] Crane Chief Complaint/Reason for Consult: biliary pancreatitis HPI:  Patient is a 59 year old female who presented to [facility] with nausea, vomiting and abdominal pain. She reports 2 weeks of worsening nausea, hx of diabetic gastroparesis as well. For 2 days prior to admission on 02/29/20 she was unable to keep anything down. She reports prior to this she has not had similar symptoms in the last 4 years. Workup in the ED showed acute pancreatitis with minimal elevation in lipase to 53 (ref. Range 11-51 normal). No known history of gallstones or acute pancreatitis. Patient denies alcohol use, tobacco use or illicit drug use. PMH otherwise significant for HTN, CVA with gait abnormality, R carotid artery stenosis, T2DM with gastroparesis, Neoplasm of parotid gland, GERD, HLD. Patient does not take any blood thinning medications at home. Past abdominal surgery includes c-section and abdominal hysterectomy. Patient underwent upper endoscopy with Dr. [name] 3/18 which was negative for other cause of symptoms, ERCP was not done.    ROS: Review of Systems  Constitutional: Negative for chills and fever.  Respiratory: Negative for shortness of breath.   Cardiovascular: Negative for chest pain.  Gastrointestinal: Positive for abdominal pain. Negative for blood in stool, constipation, diarrhea, melena, nausea and vomiting.  Genitourinary: Negative for dysuria, frequency and urgency.  All other systems reviewed and are negative.   Family History  Problem Relation Age of Onset  . Hypertension Mother   . Heart disease Mother   . Diabetes Mother   . Thyroid disease Mother   . Congestive Heart Failure Mother   . Breast cancer Maternal Grandmother   . Colon cancer Maternal Grandfather   . Heart attack Sister   . Heart disease Brother   . Hyperlipidemia Brother   . Hypertension Brother   . Diabetes Father    . Breast cancer Maternal Aunt     Past Medical History:  Diagnosis Date  . Anemia 2006  . Depression 2014   previously on amitryptiline   . Diabetes mellitus with neurological manifestation ([place]) 2006  . Fracture of left ankle 1997   . Gastroparesis 07/2016  . HOH (hard of hearing) 2004   . Hyperlipidemia 2006  . Hypertension 2006  . IBS (irritable bowel syndrome) 2002   . Leukopenia 2015   . Macular degeneration 11/2019  . Shingles 2009   . Stroke ([place]) 2020  . Thyroid nodule 2004    Past Surgical History:  Procedure Laterality Date  . ABDOMINAL HYSTERECTOMY  2005  . CATARACT EXTRACTION Left 11/2019  . CESAREAN SECTION  1983   . ESOPHAGOGASTRODUODENOSCOPY (EGD) WITH PROPOFOL Left 08/26/2014   Procedure: ESOPHAGOGASTRODUODENOSCOPY (EGD) WITH PROPOFOL;  Surgeon: Arta Silence, MD;  Location: [facility] ENDOSCOPY;  Service: Endoscopy;  Laterality: Left;  . ESOPHAGOGASTRODUODENOSCOPY (EGD) WITH PROPOFOL N/A 03/03/2020   Procedure: ESOPHAGOGASTRODUODENOSCOPY (EGD) WITH PROPOFOL;  Surgeon: Jerene Bears, MD;  Location: [facility] ENDOSCOPY;  Service: Gastroenterology;  Laterality: N/A;    Social History:  reports that she has quit smoking. Her smoking use included cigarettes. She has a 0.13 pack-year smoking history. She has never used smokeless tobacco. She reports that she does not drink alcohol or use drugs.  Allergies:  Allergies  Allergen Reactions  . Hydralazine Hcl     Hair loss  . Hydrocodone Other (See Comments)    Upset stomach, itching  . Metformin And Related Nausea And Vomiting and Other (See Comments)  Stomach pains   . Plaquenil [Hydroxychloroquine Sulfate] Hives  . Sulfur Hives    Medications Prior to Admission  Medication Sig Dispense Refill  . acetaminophen (TYLENOL) 500 MG tablet Take 2 tablets (1,000 mg total) by mouth every 8 (eight) hours as needed for mild pain or moderate pain. 30 tablet 2  . aspirin EC 81 MG tablet Take 1 tablet (81 mg total) by mouth daily.  100 tablet 1  . atorvastatin (LIPITOR) 20 MG tablet Take 1 tablet (20 mg total) by mouth daily. 90 tablet 3  . cyclobenzaprine (FLEXERIL) 5 MG tablet Take 1 tablet (5 mg total) by mouth 2 (two) times daily as needed for muscle spasms. 60 tablet 2  . DULoxetine (CYMBALTA) 20 MG capsule TAKE 1 CAPSULE (20 MG TOTAL) BY MOUTH DAILY. 30 capsule 2  . ezetimibe (ZETIA) 10 MG tablet Take 10 mg by mouth daily.    Marland Kitchen gabapentin (NEURONTIN) 300 MG capsule Take 1 capsule (300 mg total) by mouth at bedtime. 30 capsule 2  . hydrochlorothiazide (HYDRODIURIL) 12.5 MG tablet Take 1 tablet (12.5 mg total) by mouth daily. 90 tablet 3  . insulin aspart (NOVOLOG FLEXPEN) 100 UNIT/ML FlexPen 7 units subcut with breakfast and dinner (Patient taking differently: Inject 7 Units into the skin 2 (two) times daily. 7 units subcut with breakfast and dinner) 15 mL 11  . Insulin Glargine (LANTUS SOLOSTAR) 100 UNIT/ML Solostar Pen Inject 60 Units into the skin at bedtime. 15 mL 11  . lisinopril (ZESTRIL) 40 MG tablet TAKE 1 TABLET (40 MG TOTAL) BY MOUTH DAILY. 30 tablet 2  . omeprazole (PRILOSEC) 20 MG capsule TAKE 1 CAPSULE (20 MG TOTAL) BY MOUTH DAILY. 30 capsule 0  . risperiDONE (RISPERDAL) 2 MG tablet Take 1 tablet (2 mg total) by mouth at bedtime. 30 tablet 2  . senna-docusate (SENOKOT S) 8.6-50 MG tablet Take 1 tablet by mouth daily as needed for moderate constipation. 30 tablet 2  . traZODone (DESYREL) 50 MG tablet TAKE 1/2-1 TABLETS (25-50 MG TOTAL) BY MOUTH AT BEDTIME AS NEEDED FOR SLEEP. MUST MAKE APPT FOR FURTHER REFILLS (Patient taking differently: Take 25-50 mg by mouth at bedtime as needed for sleep. ) 30 tablet 2  . Blood Glucose Monitoring Suppl (TRUE METRIX METER) w/Device KIT Use as directed 1 kit 0  . diclofenac sodium (VOLTAREN) 1 % GEL Apply 4 g topically 4 (four) times daily. (Patient not taking: Reported on 02/29/2020) 100 g 3  . Insulin Pen Needle (PEN NEEDLES) 32G X 4 MM MISC 1 each by Does not apply route 4  (four) times daily. 120 each 11    Blood pressure (!) 163/87, pulse 76, temperature 98.4 F (36.9 C), temperature source Oral, resp. rate 17, height 5' 3" (1.6 m), weight 91.6 kg, SpO2 99 %. Physical Exam:  General: pleasant, WD, obese female who is sitting in chair in NAD HEENT: Sclera are anicteric.  PERRL.  Ears and nose without any masses or lesions.  Mouth is pink and moist Heart: regular, rate, and rhythm.  Normal s1,s2. No obvious murmurs, gallops, or rubs noted.  Palpable radial and pedal pulses bilaterally Lungs: CTAB, no wheezes, rhonchi, or rales noted.  Respiratory effort nonlabored Abd: soft, mild ttp in epigastrium, ND, +BS, no masses, hernias, or organomegaly MS: all 4 extremities are symmetrical with no cyanosis, clubbing, or edema. Skin: warm and dry with no masses, lesions, or rashes Neuro: Cranial nerves 2-12 grossly intact, sensation grossly intact throughout Psych: A&Ox3 with an appropriate affect.  Results for orders placed or performed during the hospital encounter of 02/29/20 (from the past 48 hour(s))  Glucose, capillary     Status: Abnormal   Collection Time: 03/02/20  4:05 PM  Result Value Ref Range   Glucose-Capillary 161 (H) 70 - 99 mg/dL    Comment: Glucose reference range applies only to samples taken after fasting for at least 8 hours.  Glucose, capillary     Status: Abnormal   Collection Time: 03/02/20  6:11 PM  Result Value Ref Range   Glucose-Capillary 187 (H) 70 - 99 mg/dL    Comment: Glucose reference range applies only to samples taken after fasting for at least 8 hours.  Glucose, capillary     Status: Abnormal   Collection Time: 03/02/20  7:51 PM  Result Value Ref Range   Glucose-Capillary 200 (H) 70 - 99 mg/dL    Comment: Glucose reference range applies only to samples taken after fasting for at least 8 hours.  Glucose, capillary     Status: Abnormal   Collection Time: 03/02/20 11:25 PM  Result Value Ref Range   Glucose-Capillary 160 (H) 70  - 99 mg/dL    Comment: Glucose reference range applies only to samples taken after fasting for at least 8 hours.  Glucose, capillary     Status: Abnormal   Collection Time: 03/03/20  3:23 AM  Result Value Ref Range   Glucose-Capillary 107 (H) 70 - 99 mg/dL    Comment: Glucose reference range applies only to samples taken after fasting for at least 8 hours.  Lipase, blood     Status: None   Collection Time: 03/03/20  3:41 AM  Result Value Ref Range   Lipase 20 11 - 51 U/L    Comment: Performed at [facility], [place] [address] [place], [place] [address]  CBC with Differential/Platelet     Status: Abnormal   Collection Time: 03/03/20  3:41 AM  Result Value Ref Range   WBC 10.9 (H) 4.0 - 10.5 K/uL   RBC 3.36 (L) 3.87 - 5.11 MIL/uL   Hemoglobin 9.7 (L) 12.0 - 15.0 g/dL   HCT 31.4 (L) 36.0 - 46.0 %   MCV 93.5 80.0 - 100.0 fL   MCH 28.9 26.0 - 34.0 pg   MCHC 30.9 30.0 - 36.0 g/dL   RDW 12.5 11.5 - 15.5 %   Platelets 222 150 - 400 K/uL   nRBC 0.0 0.0 - 0.2 %   Neutrophils Relative % 45 %   Neutro Abs 4.9 1.7 - 7.7 K/uL   Lymphocytes Relative 46 %   Lymphs Abs 4.9 (H) 0.7 - 4.0 K/uL   Monocytes Relative 8 %   Monocytes Absolute 0.9 0.1 - 1.0 K/uL   Eosinophils Relative 1 %   Eosinophils Absolute 0.1 0.0 - 0.5 K/uL   Basophils Relative 0 %   Basophils Absolute 0.0 0.0 - 0.1 K/uL   Immature Granulocytes 0 %   Abs Immature Granulocytes 0.02 0.00 - 0.07 K/uL    Comment: Performed at [facility], [place] [address], [place], [place] [address]  Comprehensive metabolic panel     Status: Abnormal   Collection Time: 03/03/20  3:41 AM  Result Value Ref Range   Sodium 139 135 - 145 mmol/L   Potassium 4.3 3.5 - 5.1 mmol/L   Chloride 107 98 - 111 mmol/L   CO2 25 22 - 32 mmol/L   Glucose, Bld 105 (H) 70 - 99 mg/dL    Comment:  Glucose reference range applies only to samples taken after fasting for at least 8 hours.   BUN 36 (H) 6 - 20 mg/dL   Creatinine, Ser  1.72 (H) 0.44 - 1.00 mg/dL   Calcium 8.4 (L) 8.9 - 10.3 mg/dL   Total Protein 6.3 (L) 6.5 - 8.1 g/dL   Albumin 3.0 (L) 3.5 - 5.0 g/dL   AST 11 (L) 15 - 41 U/L   ALT 13 0 - 44 U/L   Alkaline Phosphatase 44 38 - 126 U/L   Total Bilirubin 0.4 0.3 - 1.2 mg/dL   GFR calc non Af Amer 32 (L) >60 mL/min   GFR calc Af Amer 37 (L) >60 mL/min   Anion gap 7 5 - 15    Comment: Performed at [facility], [place] [address], [place], [place] [address]  Magnesium     Status: None   Collection Time: 03/03/20  3:41 AM  Result Value Ref Range   Magnesium 2.2 1.7 - 2.4 mg/dL    Comment: Performed at [facility], [place] [address], [place], [place] [address]  Glucose, capillary     Status: None   Collection Time: 03/03/20  7:40 AM  Result Value Ref Range   Glucose-Capillary 97 70 - 99 mg/dL    Comment: Glucose reference range applies only to samples taken after fasting for at least 8 hours.  Respiratory Panel by RT PCR (Flu A&B, Covid) - Nasopharyngeal Swab     Status: None   Collection Time: 03/03/20 11:15 AM   Specimen: Nasopharyngeal Swab  Result Value Ref Range   SARS Coronavirus 2 by RT PCR NEGATIVE NEGATIVE    Comment: (NOTE) SARS-CoV-2 target nucleic acids are NOT DETECTED. The SARS-CoV-2 RNA is generally detectable in upper respiratoy specimens during the acute phase of infection. The lowest concentration of SARS-CoV-2 viral copies this assay can detect is 131 copies/mL. A negative result does not preclude SARS-Cov-2 infection and should not be used as the sole basis for treatment or other patient management decisions. A negative result may occur with  improper specimen collection/handling, submission of specimen other than nasopharyngeal swab, presence of viral mutation(s) within the areas targeted by this assay, and inadequate number of viral copies (<131 copies/mL). A negative result must be combined with clinical observations, patient history, and  epidemiological information. The expected result is Negative. Fact Sheet for Patients:  PinkCheek.be Fact Sheet for Healthcare Providers:  GravelBags.it This test is not yet ap proved or cleared by the [place] FDA and  has been authorized for detection and/or diagnosis of SARS-CoV-2 by FDA under an Emergency Use Authorization (EUA). This EUA will remain  in effect (meaning this test can be used) for the duration of the COVID-19 declaration under Section 564(b)(1) of the Act, 21 U.S.C. section 360bbb-3(b)(1), unless the authorization is terminated or revoked sooner.    Influenza A by PCR NEGATIVE NEGATIVE   Influenza B by PCR NEGATIVE NEGATIVE    Comment: (NOTE) The Xpert Xpress SARS-CoV-2/FLU/RSV assay is intended as an aid in  the diagnosis of influenza from Nasopharyngeal swab specimens and  should not be used as a sole basis for treatment. Nasal washings and  aspirates are unacceptable for Xpert Xpress SARS-CoV-2/FLU/RSV  testing. Fact Sheet for Patients: PinkCheek.be Fact Sheet for Healthcare Providers: GravelBags.it This test is not yet approved or cleared by the [place] FDA and  has been authorized for detection and/or diagnosis of SARS-CoV-2 by  FDA under an Emergency Use Authorization (  EUA). This EUA will remain  in effect (meaning this test can be used) for the duration of the  Covid-19 declaration under Section 564(b)(1) of the Act, 21  U.S.C. section 360bbb-3(b)(1), unless the authorization is  terminated or revoked. Performed at [facility], [place] [address] [place], [place] [address]   Glucose, capillary     Status: Abnormal   Collection Time: 03/03/20 11:52 AM  Result Value Ref Range   Glucose-Capillary 119 (H) 70 - 99 mg/dL    Comment: Glucose reference range applies only to samples taken after fasting for at least 8  hours.  Glucose, capillary     Status: None   Collection Time: 03/03/20  3:04 PM  Result Value Ref Range   Glucose-Capillary 95 70 - 99 mg/dL    Comment: Glucose reference range applies only to samples taken after fasting for at least 8 hours.  Glucose, capillary     Status: Abnormal   Collection Time: 03/03/20  4:37 PM  Result Value Ref Range   Glucose-Capillary 105 (H) 70 - 99 mg/dL    Comment: Glucose reference range applies only to samples taken after fasting for at least 8 hours.  Glucose, capillary     Status: Abnormal   Collection Time: 03/03/20  8:01 PM  Result Value Ref Range   Glucose-Capillary 173 (H) 70 - 99 mg/dL    Comment: Glucose reference range applies only to samples taken after fasting for at least 8 hours.  Glucose, capillary     Status: Abnormal   Collection Time: 03/03/20 11:59 PM  Result Value Ref Range   Glucose-Capillary 113 (H) 70 - 99 mg/dL    Comment: Glucose reference range applies only to samples taken after fasting for at least 8 hours.  Creatinine, serum     Status: Abnormal   Collection Time: 03/04/20  3:37 AM  Result Value Ref Range   Creatinine, Ser 1.09 (H) 0.44 - 1.00 mg/dL   GFR calc non Af Amer 56 (L) >60 mL/min   GFR calc Af Amer >60 >60 mL/min    Comment: Performed at [facility], [place] [address], [place], [place] [address]  Glucose, capillary     Status: None   Collection Time: 03/04/20  4:43 AM  Result Value Ref Range   Glucose-Capillary 87 70 - 99 mg/dL    Comment: Glucose reference range applies only to samples taken after fasting for at least 8 hours.  Glucose, capillary     Status: None   Collection Time: 03/04/20  7:32 AM  Result Value Ref Range   Glucose-Capillary 79 70 - 99 mg/dL    Comment: Glucose reference range applies only to samples taken after fasting for at least 8 hours.  Glucose, capillary     Status: Abnormal   Collection Time: 03/04/20 11:38 AM  Result Value Ref Range   Glucose-Capillary 202 (H)  70 - 99 mg/dL    Comment: Glucose reference range applies only to samples taken after fasting for at least 8 hours.   DG Abd Portable 1V  Result Date: 03/03/2020 CLINICAL DATA:  Abdominal pain EXAM: PORTABLE ABDOMEN - 1 VIEW COMPARISON:  CT 02/29/2020 FINDINGS: Moderate stool burden. Nonobstructive bowel gas pattern. No organomegaly, suspicious calcification or free air. IMPRESSION: Moderate stool burden.  No acute findings. Electronically Signed   By: Rolm Baptise M.D.   On: 03/03/2020 08:45      Assessment/Plan HTN CVA with gait abnormality R carotid artery stenosis T2DM with gastroparesis  Neoplasm of parotid gland GERD HLD  Acute pancreatitis - presumed gallstone but imaging without definitive stones - LFTs and Tbili have all been normal since admission - patient having very minimal abdominal pain at this point and tolerating diet - recommend RUQ [place] to better define gallstones vs biliary sludge - if patient has stones present, reasonable to consider lap chole prior to discharge to prevent future biliary pancreatitis  FEN: HH diet VTE: SCDs, lovenox ID: no current abx, WBC 10.9 and patient is afebrile  Brigid Re, [facility] Surgery 03/04/2020, 2:13 PM Please see Amion for pager number during day hours 7:00am-4:30pm

## 2020-03-05 ENCOUNTER — Encounter (HOSPITAL_COMMUNITY)
Admission: EM | Disposition: A | Discharge status: Skilled Nursing Facility | Payer: Self-pay | Source: Home / Self Care | Attending: Internal Medicine

## 2020-03-05 ENCOUNTER — Inpatient Hospital Stay (HOSPITAL_COMMUNITY): Payer: 59

## 2020-03-05 ENCOUNTER — Encounter (HOSPITAL_COMMUNITY): Payer: Self-pay | Admitting: Internal Medicine

## 2020-03-05 ENCOUNTER — Inpatient Hospital Stay (HOSPITAL_COMMUNITY): Payer: 59 | Admitting: Anesthesiology

## 2020-03-05 HISTORY — PX: CHOLECYSTECTOMY: SHX55

## 2020-03-05 LAB — CBC WITH DIFFERENTIAL/PLATELET
Abs Immature Granulocytes: 0.02 10*3/uL (ref 0.00–0.07)
Basophils Absolute: 0.1 10*3/uL (ref 0.0–0.1)
Basophils Relative: 1 %
Eosinophils Absolute: 0.2 10*3/uL (ref 0.0–0.5)
Eosinophils Relative: 3 %
HCT: 33.3 % — ABNORMAL LOW (ref 36.0–46.0)
Hemoglobin: 10.5 g/dL — ABNORMAL LOW (ref 12.0–15.0)
Immature Granulocytes: 0 %
Lymphocytes Relative: 41 %
Lymphs Abs: 3.7 10*3/uL (ref 0.7–4.0)
MCH: 28.4 pg (ref 26.0–34.0)
MCHC: 31.5 g/dL (ref 30.0–36.0)
MCV: 90 fL (ref 80.0–100.0)
Monocytes Absolute: 0.7 10*3/uL (ref 0.1–1.0)
Monocytes Relative: 8 %
Neutro Abs: 4.3 10*3/uL (ref 1.7–7.7)
Neutrophils Relative %: 47 %
Platelets: 231 10*3/uL (ref 150–400)
RBC: 3.7 MIL/uL — ABNORMAL LOW (ref 3.87–5.11)
RDW: 12.2 % (ref 11.5–15.5)
WBC: 9 10*3/uL (ref 4.0–10.5)
nRBC: 0 % (ref 0.0–0.2)

## 2020-03-05 LAB — COMPREHENSIVE METABOLIC PANEL
ALT: 22 U/L (ref 0–44)
AST: 16 U/L (ref 15–41)
Albumin: 3.1 g/dL — ABNORMAL LOW (ref 3.5–5.0)
Alkaline Phosphatase: 50 U/L (ref 38–126)
Anion gap: 8 (ref 5–15)
BUN: 24 mg/dL — ABNORMAL HIGH (ref 6–20)
CO2: 26 mmol/L (ref 22–32)
Calcium: 8.9 mg/dL (ref 8.9–10.3)
Chloride: 108 mmol/L (ref 98–111)
Creatinine, Ser: 0.86 mg/dL (ref 0.44–1.00)
GFR calc Af Amer: >60 mL/min (ref >60)
GFR calc non Af Amer: >60 mL/min (ref >60)
Glucose, Bld: 112 mg/dL — ABNORMAL HIGH (ref 70–99)
Potassium: 4 mmol/L (ref 3.5–5.1)
Sodium: 142 mmol/L (ref 135–145)
Total Bilirubin: 0.7 mg/dL (ref 0.3–1.2)
Total Protein: 6.3 g/dL — ABNORMAL LOW (ref 6.5–8.1)

## 2020-03-05 LAB — GLUCOSE, CAPILLARY
Glucose-Capillary: 185 mg/dL — ABNORMAL HIGH (ref 70–99)
Glucose-Capillary: 130 mg/dL — ABNORMAL HIGH (ref 70–99)
Glucose-Capillary: 141 mg/dL — ABNORMAL HIGH (ref 70–99)
Glucose-Capillary: 176 mg/dL — ABNORMAL HIGH (ref 70–99)
Glucose-Capillary: 271 mg/dL — ABNORMAL HIGH (ref 70–99)

## 2020-03-05 LAB — MAGNESIUM: Magnesium: 1.9 mg/dL (ref 1.7–2.4)

## 2020-03-05 LAB — TYPE AND SCREEN
ABO/RH(D): A POS
Antibody Screen: NEGATIVE

## 2020-03-05 LAB — MRSA PCR SCREENING: MRSA by PCR: NEGATIVE

## 2020-03-05 LAB — ABO/RH: ABO/RH(D): A POS

## 2020-03-05 LAB — PHOSPHORUS: Phosphorus: 2.3 mg/dL — ABNORMAL LOW (ref 2.5–4.6)

## 2020-03-05 SURGERY — LAPAROSCOPIC CHOLECYSTECTOMY WITH INTRAOPERATIVE CHOLANGIOGRAM
Anesthesia: General | Site: Abdomen

## 2020-03-05 MED ORDER — BUPIVACAINE-EPINEPHRINE 0.25% -1:200000 IJ SOLN
INTRAMUSCULAR | Status: DC | PRN
  Administered 2020-03-05: 20 mL

## 2020-03-05 MED ORDER — HYDROMORPHONE HCL 1 MG/ML IJ SOLN: 0.2500 mg | INTRAMUSCULAR | Status: DC | PRN

## 2020-03-05 MED ORDER — PROPOFOL 10 MG/ML IV BOLUS
INTRAVENOUS | Status: AC
  Filled 2020-03-05: qty 20

## 2020-03-05 MED ORDER — MEPERIDINE HCL 50 MG/ML IJ SOLN: 6.2500 mg | INTRAMUSCULAR | Status: DC | PRN

## 2020-03-05 MED ORDER — BUPIVACAINE-EPINEPHRINE (PF) 0.5% -1:200000 IJ SOLN
INTRAMUSCULAR | Status: AC
  Filled 2020-03-05: qty 30

## 2020-03-05 MED ORDER — MIDAZOLAM HCL 2 MG/2ML IJ SOLN
INTRAMUSCULAR | Status: AC
  Filled 2020-03-05: qty 2

## 2020-03-05 MED ORDER — MIDAZOLAM HCL 5 MG/5ML IJ SOLN
INTRAMUSCULAR | Status: DC | PRN
  Administered 2020-03-05: 2 mg via INTRAVENOUS

## 2020-03-05 MED ORDER — SCOPOLAMINE 1 MG/3DAYS TD PT72
MEDICATED_PATCH | TRANSDERMAL | Status: AC
  Filled 2020-03-05: qty 1

## 2020-03-05 MED ORDER — ACETAMINOPHEN 160 MG/5ML PO SOLN: 325.0000 mg | Freq: Once | ORAL | Status: DC | PRN

## 2020-03-05 MED ORDER — ONDANSETRON HCL 4 MG/2ML IJ SOLN
INTRAMUSCULAR | Status: AC
  Filled 2020-03-05: qty 2

## 2020-03-05 MED ORDER — DEXAMETHASONE SODIUM PHOSPHATE 10 MG/ML IJ SOLN
INTRAMUSCULAR | Status: AC
  Filled 2020-03-05: qty 1

## 2020-03-05 MED ORDER — ROCURONIUM BROMIDE 10 MG/ML (PF) SYRINGE
PREFILLED_SYRINGE | INTRAVENOUS | Status: AC
  Filled 2020-03-05: qty 10

## 2020-03-05 MED ORDER — ACETAMINOPHEN 10 MG/ML IV SOLN: 1000.0000 mg | Freq: Once | INTRAVENOUS | Status: DC | PRN

## 2020-03-05 MED ORDER — CEFAZOLIN SODIUM-DEXTROSE 2-4 GM/100ML-% IV SOLN
2.0000 g | INTRAVENOUS | Status: AC
  Administered 2020-03-05: 2 g via INTRAVENOUS

## 2020-03-05 MED ORDER — SODIUM CHLORIDE 0.9 % IV SOLN
INTRAVENOUS | Status: DC | PRN
  Administered 2020-03-05: 5 mL

## 2020-03-05 MED ORDER — FENTANYL CITRATE (PF) 250 MCG/5ML IJ SOLN
INTRAMUSCULAR | Status: AC
  Filled 2020-03-05: qty 5

## 2020-03-05 MED ORDER — PROPOFOL 10 MG/ML IV BOLUS
INTRAVENOUS | Status: DC | PRN
  Administered 2020-03-05: 130 mg via INTRAVENOUS

## 2020-03-05 MED ORDER — LIDOCAINE 2% (20 MG/ML) 5 ML SYRINGE
INTRAMUSCULAR | Status: DC | PRN
  Administered 2020-03-05: 40 mg via INTRAVENOUS

## 2020-03-05 MED ORDER — OXYCODONE HCL 5 MG/5ML PO SOLN: 5.0000 mg | Freq: Once | ORAL | Status: DC | PRN

## 2020-03-05 MED ORDER — SCOPOLAMINE 1 MG/3DAYS TD PT72
MEDICATED_PATCH | TRANSDERMAL | Status: DC | PRN
  Administered 2020-03-05: 1 via TRANSDERMAL

## 2020-03-05 MED ORDER — ROCURONIUM BROMIDE 50 MG/5ML IV SOSY
PREFILLED_SYRINGE | INTRAVENOUS | Status: DC | PRN
  Administered 2020-03-05: 50 mg via INTRAVENOUS

## 2020-03-05 MED ORDER — FENTANYL CITRATE (PF) 100 MCG/2ML IJ SOLN
INTRAMUSCULAR | Status: DC | PRN
  Administered 2020-03-05: 100 ug via INTRAVENOUS

## 2020-03-05 MED ORDER — EPHEDRINE 5 MG/ML INJ
INTRAVENOUS | Status: AC
  Filled 2020-03-05: qty 10

## 2020-03-05 MED ORDER — ACETAMINOPHEN 325 MG PO TABS: 325.0000 mg | ORAL_TABLET | Freq: Once | ORAL | Status: DC | PRN

## 2020-03-05 MED ORDER — PROMETHAZINE HCL 25 MG/ML IJ SOLN: 6.2500 mg | INTRAMUSCULAR | Status: DC | PRN

## 2020-03-05 MED ORDER — ONDANSETRON HCL 4 MG/2ML IJ SOLN
INTRAMUSCULAR | Status: DC | PRN
  Administered 2020-03-05: 4 mg via INTRAVENOUS

## 2020-03-05 MED ORDER — LACTATED RINGERS IV SOLN: INTRAVENOUS | Status: DC

## 2020-03-05 MED ORDER — DEXAMETHASONE SODIUM PHOSPHATE 10 MG/ML IJ SOLN
INTRAMUSCULAR | Status: DC | PRN
  Administered 2020-03-05: 4 mg via INTRAVENOUS

## 2020-03-05 MED ORDER — LACTATED RINGERS IV SOLN
INTRAVENOUS | Status: DC | PRN
  Administered 2020-03-05: 1000 mL

## 2020-03-05 MED ORDER — EPHEDRINE SULFATE-NACL 50-0.9 MG/10ML-% IV SOSY
PREFILLED_SYRINGE | INTRAVENOUS | Status: DC | PRN
  Administered 2020-03-05 (×2): 5 mg via INTRAVENOUS

## 2020-03-05 MED ORDER — LIDOCAINE 2% (20 MG/ML) 5 ML SYRINGE
INTRAMUSCULAR | Status: AC
  Filled 2020-03-05: qty 5

## 2020-03-05 MED ORDER — CEFAZOLIN SODIUM-DEXTROSE 2-4 GM/100ML-% IV SOLN
INTRAVENOUS | Status: AC
  Filled 2020-03-05: qty 100

## 2020-03-05 MED ORDER — 0.9 % SODIUM CHLORIDE (POUR BTL) OPTIME
TOPICAL | Status: DC | PRN
  Administered 2020-03-05: 1000 mL

## 2020-03-05 MED ORDER — OXYCODONE HCL 5 MG PO TABS: 5.0000 mg | ORAL_TABLET | Freq: Once | ORAL | Status: DC | PRN

## 2020-03-05 SURGICAL SUPPLY — 40 items
APPLIER CLIP ROT 10 11.4 M/L (STAPLE) ×2
BENZOIN TINCTURE PRP APPL 2/3 (GAUZE/BANDAGES/DRESSINGS) ×2 IMPLANT
CHLORAPREP W/TINT 26 (MISCELLANEOUS) ×2 IMPLANT
CLIP APPLIE ROT 10 11.4 M/L (STAPLE) ×1 IMPLANT
COVER MAYO STAND STRL (DRAPES) ×2 IMPLANT
COVER SURGICAL LIGHT HANDLE (MISCELLANEOUS) ×2 IMPLANT
COVER WAND RF STERILE (DRAPES) IMPLANT
DECANTER SPIKE VIAL GLASS SM (MISCELLANEOUS) ×2 IMPLANT
DRAPE C-ARM 42X120 X-RAY (DRAPES) ×2 IMPLANT
DRAPE UTILITY XL STRL (DRAPES) ×2 IMPLANT
DRSG TEGADERM 2-3/8X2-3/4 SM (GAUZE/BANDAGES/DRESSINGS) ×6 IMPLANT
DRSG TEGADERM 4X4.75 (GAUZE/BANDAGES/DRESSINGS) ×2 IMPLANT
ELECT REM PT RETURN 15FT ADLT (MISCELLANEOUS) ×2 IMPLANT
FILTER SMOKE EVAC LAPAROSHD (FILTER) ×2 IMPLANT
GLOVE BIO SURGEON STRL SZ7 (GLOVE) ×2 IMPLANT
GLOVE BIOGEL PI IND STRL 7.5 (GLOVE) ×1 IMPLANT
GLOVE BIOGEL PI INDICATOR 7.5 (GLOVE) ×1
GOWN STRL REUS W/TWL LRG LVL3 (GOWN DISPOSABLE) ×2 IMPLANT
GOWN STRL REUS W/TWL XL LVL3 (GOWN DISPOSABLE) ×4 IMPLANT
KIT BASIN OR (CUSTOM PROCEDURE TRAY) ×2 IMPLANT
KIT TURNOVER KIT A (KITS) IMPLANT
NS IRRIG 1000ML POUR BTL (IV SOLUTION) ×2 IMPLANT
PENCIL SMOKE EVACUATOR (MISCELLANEOUS) IMPLANT
POUCH SPECIMEN RETRIEVAL 10MM (ENDOMECHANICALS) ×2 IMPLANT
PROTECTOR NERVE ULNAR (MISCELLANEOUS) IMPLANT
SCISSORS LAP 5X35 DISP (ENDOMECHANICALS) ×2 IMPLANT
SET CHOLANGIOGRAPH MIX (MISCELLANEOUS) ×2 IMPLANT
SET IRRIG TUBING LAPAROSCOPIC (IRRIGATION / IRRIGATOR) ×2 IMPLANT
SET TUBE SMOKE EVAC HIGH FLOW (TUBING) IMPLANT
STRIP CLOSURE SKIN 1/2X4 (GAUZE/BANDAGES/DRESSINGS) ×2 IMPLANT
SUT MNCRL AB 4-0 PS2 18 (SUTURE) ×2 IMPLANT
SYR 20ML LL LF (SYRINGE) IMPLANT
TAPE CLOTH 4X10 WHT NS (GAUZE/BANDAGES/DRESSINGS) IMPLANT
TOWEL OR 17X26 10 PK STRL BLUE (TOWEL DISPOSABLE) ×2 IMPLANT
TOWEL OR NON WOVEN STRL DISP B (DISPOSABLE) ×2 IMPLANT
TRAY LAPAROSCOPIC (CUSTOM PROCEDURE TRAY) ×2 IMPLANT
TROCAR BLADELESS OPT 5 100 (ENDOMECHANICALS) ×4 IMPLANT
TROCAR XCEL BLUNT TIP 100MML (ENDOMECHANICALS) ×2 IMPLANT
TROCAR XCEL NON-BLD 11X100MML (ENDOMECHANICALS) ×2 IMPLANT
WATER STERILE IRR 1000ML POUR (IV SOLUTION) ×2 IMPLANT

## 2020-03-05 NOTE — Progress Notes (Signed)
PROGRESS NOTE  Nancy [name] IWL:[id number] DOB: Jul 18, 1961 DOA: 02/29/2020 PCP: [name], MD  HPI/Recap of past 24 hours:  Nancy [name] is a 58 y.o. female with medical history significant for essential hypertension, CVA, right carotid artery stenosis, diabetic gastroparesis, type 2 diabetes, neoplasm of parotid gland, GERD, hyperlipidemia, gait abnormality who presented to [facility] ED with complaints of worsening nausea of 2 weeks duration.  Associated with vomiting, abdominal pain, and poor oral intake.  Denies a personal history of gallstones or pancreatitis.  No use of alcoholic beverage.  No significant weight loss.  CT scan abdomen pelvis with contrast done in the ED showed evidence of acute pancreatitis with gallbladder sludge and possible gallstones.   TRH asked to admit.  Initial Lipase elevated at 54, normalized.  MRI abdomen negative for mass but showing acute pancreatitis.  Seen by GI.  EGD completed 03/03/20 by Dr. [name]: normal.  General surgery contacted due to presumptive gallstone pancreatitis.  RUQ abdominal US confirmed gallstones.  Taken to OR on 3/20 for lap chole.  03/05/20: Seen and examined this morning she feels a lot better.  Seen by general surgery.  Will go to the OR for cholecystectomy to reduce her risk of having recurrent gallstone pancreatitis.    Assessment/Plan: Principal Problem:   Pancreatitis, acute Active Problems:   Generalized abdominal pain   Resolved Acute gallstone pancreatitis Likely the stone has passed.  Right upper quadrant ultrasound 3/19 confirmed gallstones. Taken to the OR on 03/05/2020 for laparoscopic cholecystectomy to reduce her risk of having recurrent gallstone pancreatitis. Seen by GI.  EGD normal 2015, EGD 3/18 Dr. [name] normal  Resolved leukocytosis likely reactive in the setting of acute gallstone pancreatitis.   WBC 13.3K>> 13.5K>.10.9K>> 9K  Resolved AKI likely prerenal 2/2 to dehydration from poor oral  intake Presented with elevated creatinine, peaked at 1.7. Creatinine is back to her baseline 0.8 with GFR greater than 60.  Hyperlipidemia Triglycerides normal Continue home medications  Type 2 diabetes with hyperglycemia Hemoglobin A1c 10.8 on 02/29/2020. Currently well controlled on current management  Uncontrolled hypertension  Blood pressure is stable. Continue to hold prior to admission HCTZ and lisinopril due to recent AKI.   Continue metoprolol 25 mg twice daily and Norvasc 10 mg daily.  norvasc 10 mg Continue to monitor vital signs.  Resolved sinus tachycardia likely in the setting of dehydration from poor oral intake Received IV fluid  Resolved chronic constipation Had a bowel movement 03/03/2020 Continue bowel regimen  Left heel wound, POA Wound care specialist for local wound care Continue local wound care  History of CVA Continue home medications  Ambulatory dysfunction post CVA Uses a cane to ambulate at baseline PT OT recommend CIR or SNF Continue PT OT with assistance and fall precautions TOC assisting with placement   DVT prophylaxis: Sq Lovenox daily   Code Status: Full code  Family Communication:  Will call family if okay with the patient.  Disposition Plan:  Patient is from home.  Anticipate discharge to home in the next 24 hours pending general surgery signing off and placement for physical rehab.   Consults called: GI, general surgery.    Objective: Vitals:   03/04/20 2255 03/05/20 0402 03/05/20 0931 03/05/20 1248  BP: 138/78 (!) 156/78 (!) 156/76 (!) (P) 148/60  Pulse: 80 70 76 88  Resp: 18 18 16 10   Temp: 99 F (37.2 C) 98.3 F (36.8 C) 98.6 F (37 C)   TempSrc: Oral Oral Oral  SpO2: 98% 100% 100% 100%  Weight:      Height:        Intake/Output Summary (Last 24 hours) at 03/05/2020 1250 Last data filed at 03/05/2020 1230 Gross per 24 hour  Intake 738.11 ml  Output 461 ml  Net 277.11 ml   Filed Weights   02/29/20  1304 02/29/20 1954  Weight: 91.6 kg 91.6 kg    Exam:  . General: 59 y.o. year-old female well-nourished in no acute distress.  Alert and oriented x3.   . Cardiovascular: Regular rate and rhythm no rubs or gallops.   Marland Kitchen Respiratory: Clear consultation no wheezes or rales.   . Abdomen: Soft nontender normal bowel sounds present. . Musculoskeletal: No lower extremity edema bilaterally.   Marland Kitchen Psychiatry: Mood is appropriate for condition and setting.   Data Reviewed: CBC: Recent Labs  Lab 02/29/20 1315 02/29/20 1442 03/01/20 1010 03/03/20 0341 03/05/20 0850  WBC 12.8* 13.3* 13.5* 10.9* 9.0  NEUTROABS  --   --  10.8* 4.9 4.3  HGB 13.8 13.4 11.5* 9.7* 10.5*  HCT 43.7 41.8 35.5* 31.4* 33.3*  MCV 89.5 90.5 89.4 93.5 90.0  PLT 308 290 254 222 923   Basic Metabolic Panel: Recent Labs  Lab 02/29/20 1315 03/01/20 1010 03/03/20 0341 03/04/20 0337 03/05/20 0850  NA 139 140 139  --  142  K 4.9 4.4 4.3  --  4.0  CL 100 107 107  --  108  CO2 22 23 25   --  26  GLUCOSE 249* 178* 105*  --  112*  BUN 23* 27* 36*  --  24*  CREATININE 1.20* 1.31* 1.72* 1.09* 0.86  CALCIUM 9.6 8.9 8.4*  --  8.9  MG  --   --  2.2  --  1.9  PHOS  --   --   --   --  2.3*   GFR: Estimated Creatinine Clearance: 75.7 mL/min (by C-G formula based on SCr of 0.86 mg/dL). Liver Function Tests: Recent Labs  Lab 02/29/20 1315 03/01/20 1010 03/03/20 0341 03/05/20 0850  AST 19 11* 11* 16  ALT 18 14 13 22   ALKPHOS 68 57 44 50  BILITOT 0.9 0.5 0.4 0.7  PROT 8.3* 7.4 6.3* 6.3*  ALBUMIN 4.1 3.4* 3.0* 3.1*   Recent Labs  Lab 02/29/20 1315 02/29/20 1407 03/01/20 1010 03/02/20 0422 03/03/20 0341  LIPASE 54* 53* 33 21 20   No results for input(s): AMMONIA in the last 168 hours. Coagulation Profile: No results for input(s): INR, PROTIME in the last 168 hours. Cardiac Enzymes: No results for input(s): CKTOTAL, CKMB, CKMBINDEX, TROPONINI in the last 168 hours. BNP (last 3 results) No results for input(s):  PROBNP in the last 8760 hours. HbA1C: No results for input(s): HGBA1C in the last 72 hours. CBG: Recent Labs  Lab 03/04/20 1948 03/04/20 2104 03/04/20 2335 03/05/20 0400 03/05/20 0723  GLUCAP 148* 132* 214* 185* 130*   Lipid Profile: No results for input(s): CHOL, HDL, LDLCALC, TRIG, CHOLHDL, LDLDIRECT in the last 72 hours. Thyroid Function Tests: No results for input(s): TSH, T4TOTAL, FREET4, T3FREE, THYROIDAB in the last 72 hours. Anemia Panel: No results for input(s): VITAMINB12, FOLATE, FERRITIN, TIBC, IRON, RETICCTPCT in the last 72 hours. Urine analysis:    Component Value Date/Time   COLORURINE YELLOW 02/29/2020 1315   APPEARANCEUR CLEAR 02/29/2020 1315   LABSPEC 1.041 (H) 02/29/2020 1315   PHURINE 6.0 02/29/2020 1315   GLUCOSEU >=500 (A) 02/29/2020 1315   HGBUR NEGATIVE 02/29/2020 1315  BILIRUBINUR NEGATIVE 02/29/2020 1315   BILIRUBINUR negative 12/26/2017 1458   KETONESUR 20 (A) 02/29/2020 1315   PROTEINUR 100 (A) 02/29/2020 1315   UROBILINOGEN 0.2 12/26/2017 1458   UROBILINOGEN 0.2 09/07/2014 0857   NITRITE NEGATIVE 02/29/2020 1315   LEUKOCYTESUR NEGATIVE 02/29/2020 1315   Sepsis Labs: @LABRCNTIP (procalcitonin:4,lacticidven:4)  ) Recent Results (from the past 240 hour(s))  Respiratory Panel by RT PCR (Flu A&B, Covid) - Nasopharyngeal Swab     Status: None   Collection Time: 03/03/20 11:15 AM   Specimen: Nasopharyngeal Swab  Result Value Ref Range Status   SARS Coronavirus 2 by RT PCR NEGATIVE NEGATIVE Final    Comment: (NOTE) SARS-CoV-2 target nucleic acids are NOT DETECTED. The SARS-CoV-2 RNA is generally detectable in upper respiratoy specimens during the acute phase of infection. The lowest concentration of SARS-CoV-2 viral copies this assay can detect is 131 copies/mL. A negative result does not preclude SARS-Cov-2 infection and should not be used as the sole basis for treatment or other patient management decisions. A negative result may occur  with  improper specimen collection/handling, submission of specimen other than nasopharyngeal swab, presence of viral mutation(s) within the areas targeted by this assay, and inadequate number of viral copies (<131 copies/mL). A negative result must be combined with clinical observations, patient history, and epidemiological information. The expected result is Negative. Fact Sheet for Patients:  PinkCheek.be Fact Sheet for Healthcare Providers:  GravelBags.it This test is not yet ap proved or cleared by the [place] FDA and  has been authorized for detection and/or diagnosis of SARS-CoV-2 by FDA under an Emergency Use Authorization (EUA). This EUA will remain  in effect (meaning this test can be used) for the duration of the COVID-19 declaration under Section 564(b)(1) of the Act, 21 U.S.C. section 360bbb-3(b)(1), unless the authorization is terminated or revoked sooner.    Influenza A by PCR NEGATIVE NEGATIVE Final   Influenza B by PCR NEGATIVE NEGATIVE Final    Comment: (NOTE) The Xpert Xpress SARS-CoV-2/FLU/RSV assay is intended as an aid in  the diagnosis of influenza from Nasopharyngeal swab specimens and  should not be used as a sole basis for treatment. Nasal washings and  aspirates are unacceptable for Xpert Xpress SARS-CoV-2/FLU/RSV  testing. Fact Sheet for Patients: PinkCheek.be Fact Sheet for Healthcare Providers: GravelBags.it This test is not yet approved or cleared by the [place] FDA and  has been authorized for detection and/or diagnosis of SARS-CoV-2 by  FDA under an Emergency Use Authorization (EUA). This EUA will remain  in effect (meaning this test can be used) for the duration of the  Covid-19 declaration under Section 564(b)(1) of the Act, 21  U.S.C. section 360bbb-3(b)(1), unless the authorization is  terminated or revoked. Performed  at [facility], [place] [address], [place], [place] [address]   MRSA PCR Screening     Status: None   Collection Time: 03/05/20  8:40 AM   Specimen: Nasopharyngeal  Result Value Ref Range Status   MRSA by PCR NEGATIVE NEGATIVE Final    Comment:        The GeneXpert MRSA Assay (FDA approved for NASAL specimens only), is one component of a comprehensive MRSA colonization surveillance program. It is not intended to diagnose MRSA infection nor to guide or monitor treatment for MRSA infections. Performed at [facility], [place] [address], [place], [place] [address]       Studies: DG Cholangiogram Operative  Result Date: 03/05/2020 CLINICAL DATA:  59 year old female undergoing  laparoscopic cholecystectomy with intraoperative cholangiogram EXAM: INTRAOPERATIVE CHOLANGIOGRAM TECHNIQUE: Cholangiographic images from the C-arm fluoroscopic device were submitted for interpretation post-operatively. Please see the procedural report for the amount of contrast and the fluoroscopy time utilized. COMPARISON:  Abdominal ultrasound 03/04/2020 FINDINGS: A single cine clip is submitted for interpretation. There is attempted intraoperative cholangiogram with partial filling of the cystic duct. However, the majority of the injected contrast material extravasates into the peritoneal space. IMPRESSION: Unsuccessful intraoperative cholangiogram. Electronically Signed   By: Jacqulynn Cadet M.D.   On: 03/05/2020 12:27   US Abdomen Limited RUQ  Result Date: 03/04/2020 CLINICAL DATA:  Gallstone pancreatitis EXAM: ULTRASOUND ABDOMEN LIMITED RIGHT UPPER QUADRANT COMPARISON:  CT 02/29/2020 FINDINGS: Gallbladder: Multiple small stones/gravel layering in the gallbladder. No wall thickening or sonographic Murphy sign. Common bile duct: Diameter: Normal caliber, 4 mm Liver: No focal lesion identified. Within normal limits in parenchymal echogenicity. Portal vein is patent on color  Doppler imaging with normal direction of blood flow towards the liver. Other: None. IMPRESSION: Numerous small layering gallstones. No evidence of acute cholecystitis. Electronically Signed   By: Rolm Baptise M.D.   On: 03/04/2020 22:49    Scheduled Meds: . [MAR Hold] amLODipine  10 mg Oral Daily  . [MAR Hold] aspirin EC  81 mg Oral Daily  . [MAR Hold] atorvastatin  20 mg Oral Daily  . [MAR Hold] Chlorhexidine Gluconate Cloth  6 each Topical Daily  . [MAR Hold] DULoxetine  20 mg Oral Daily  . [MAR Hold] enoxaparin (LOVENOX) injection  40 mg Subcutaneous Q24H  . [MAR Hold] ezetimibe  10 mg Oral Daily  . [MAR Hold] insulin aspart  0-9 Units Subcutaneous Q4H  . [MAR Hold] metoprolol tartrate  25 mg Oral BID  . [MAR Hold] pantoprazole  40 mg Oral BID  . [MAR Hold] risperiDONE  2 mg Oral QHS  . [MAR Hold] senna-docusate  2 tablet Oral BID    Continuous Infusions: . lactated ringers 75 mL/hr at 03/05/20 0619     LOS: 5 days     Kayleen Memos, MD [facility] Pager [phone]  If 7PM-7AM, please contact night-coverage [email or web address] Password [facility] 03/05/2020, 12:50 PM

## 2020-03-05 NOTE — Anesthesia Postprocedure Evaluation (Signed)
Anesthesia Post Note  Patient: Nancy [name]  Procedure(s) Performed: LAPAROSCOPIC CHOLECYSTECTOMY WITH INTRAOPERATIVE CHOLANGIOGRAM (N/A Abdomen)     Patient location during evaluation: PACU Anesthesia Type: General Level of consciousness: awake and alert Pain management: pain level controlled Vital Signs Assessment: post-procedure vital signs reviewed and stable Respiratory status: spontaneous breathing, nonlabored ventilation, respiratory function stable and patient connected to nasal cannula oxygen Cardiovascular status: blood pressure returned to baseline and stable Postop Assessment: no apparent nausea or vomiting Anesthetic complications: no    Last Vitals:  Vitals:   03/05/20 1315 03/05/20 1330  BP: (!) 152/67 (!) 149/71  Pulse: 83 84  Resp: 12 12  Temp:  36.6 C  SpO2: 97% 97%    Last Pain:  Vitals:   03/05/20 1330  TempSrc:   PainSc: 0-No pain                 Effie Berkshire

## 2020-03-05 NOTE — Anesthesia Procedure Notes (Addendum)
Procedure Name: Intubation Date/Time: 03/05/2020 11:32 AM Performed by: Montel Clock, CRNA Pre-anesthesia Checklist: Patient identified, Emergency Drugs available, Suction available, Patient being monitored and Timeout performed Patient Re-evaluated:Patient Re-evaluated prior to induction Oxygen Delivery Method: Circle system utilized Preoxygenation: Pre-oxygenation with 100% oxygen Induction Type: IV induction Ventilation: Mask ventilation without difficulty Laryngoscope Size: Mac and 3 Grade View: Grade I Tube type: Oral Tube size: 7.0 mm Number of attempts: 1 Airway Equipment and Method: Stylet Placement Confirmation: ETT inserted through vocal cords under direct vision,  positive ETCO2 and breath sounds checked- equal and bilateral Secured at: 21 cm Tube secured with: Tape Dental Injury: Teeth and Oropharynx as per pre-operative assessment

## 2020-03-05 NOTE — Transfer of Care (Signed)
Immediate Anesthesia Transfer of Care Note  Patient: Nancy [name]  Procedure(s) Performed: LAPAROSCOPIC CHOLECYSTECTOMY WITH INTRAOPERATIVE CHOLANGIOGRAM (N/A Abdomen)  Patient Location: PACU  Anesthesia Type:General  Level of Consciousness: drowsy and patient cooperative  Airway & Oxygen Therapy: Patient Spontanous Breathing and Patient connected to face mask oxygen  Post-op Assessment: Report given to RN and Post -op Vital signs reviewed and stable  Post vital signs: Reviewed and stable  Last Vitals:  Vitals Value Taken Time  BP 149/71 03/05/20 1330  Temp 36.6 C 03/05/20 1330  Pulse 82 03/05/20 1331  Resp 13 03/05/20 1331  SpO2 93 % 03/05/20 1331  Vitals shown include unvalidated device data.  Last Pain:  Vitals:   03/05/20 1330  TempSrc:   PainSc: 0-No pain      Patients Stated Pain Goal: 2 (63/89/37 3428)  Complications: No apparent anesthesia complications

## 2020-03-05 NOTE — Op Note (Signed)
Laparoscopic Cholecystectomy Procedure Note  Indications: This patient presents with gallstone pancreatitis and presents now for cholecystectomy.  Pre-operative Diagnosis: Gallstone pancreatitis  Post-operative Diagnosis: Same  Surgeon: Maia Petties   Assistants: none  Anesthesia: General endotracheal anesthesia  ASA Class: 2  Procedure Details  The patient was seen again in the Holding Room. The risks, benefits, complications, treatment options, and expected outcomes were discussed with the patient. The possibilities of reaction to medication, pulmonary aspiration, perforation of viscus, bleeding, recurrent infection, finding a normal gallbladder, the need for additional procedures, failure to diagnose a condition, the possible need to convert to an open procedure, and creating a complication requiring transfusion or operation were discussed with the patient. The likelihood of improving the patient's symptoms with return to their baseline status is good.  The patient and/or family concurred with the proposed plan, giving informed consent. The site of surgery properly noted. The patient was taken to Operating Room, identified as Jeronimo Norma and the procedure verified as Laparoscopic Cholecystectomy with Intraoperative Cholangiogram. A Time Out was held and the above information confirmed.  Prior to the induction of general anesthesia, antibiotic prophylaxis was administered. General endotracheal anesthesia was then administered and tolerated well. After the induction, the abdomen was prepped with Chloraprep and draped in the sterile fashion. The patient was positioned in the supine position.  Local anesthetic agent was injected into the skin below the umbilicus and an incision made. We dissected down to the abdominal fascia with blunt dissection.  The fascia was incised vertically and we entered the peritoneal cavity bluntly.  A pursestring suture of 0-Vicryl was placed around the  fascial opening.  The Hasson cannula was inserted and secured with the stay suture.  Pneumoperitoneum was then created with CO2 and tolerated well without any adverse changes in the patient's vital signs. An 11-mm port was placed in the subxiphoid position.  Two 5-mm ports were placed in the right upper quadrant. All skin incisions were infiltrated with a local anesthetic agent before making the incision and placing the trocars.   We positioned the patient in reverse Trendelenburg, tilted slightly to the patient's left.  The gallbladder was identified, the fundus grasped and retracted cephalad. There are some omental adhesions to the fundus of the gallbladder, but the wall of the gallbladder was quite thin.  Adhesions were lysed bluntly and with the electrocautery where indicated, taking care not to injure any adjacent organs or viscus. The infundibulum was grasped and retracted laterally, exposing the peritoneum overlying the triangle of Calot. This was then divided and exposed in a blunt fashion. A critical view of the cystic duct and cystic artery was obtained.  The cystic duct was clearly identified and bluntly dissected circumferentially. The cystic duct was ligated with a clip distally.   An incision was made in the cystic duct and we attempted a cholangiogram.  However, this was unsuccessful because we were unable to pass the catheter into the cystic duct far enough to secure it with a clip.    The cystic duct was then ligated with clips and divided. The cystic artery was identified, dissected free, ligated with clips and divided as well.   The gallbladder was dissected from the liver bed in retrograde fashion with the electrocautery.  Some bile and tiny stones were spilled but these were immediately suctioned out. The gallbladder was removed and placed in an Endocatch sac. The liver bed was irrigated and inspected. Hemostasis was achieved with the electrocautery. Copious irrigation was utilized  and was  repeatedly aspirated until clear.    The gallbladder and Endocatch sac were then removed through the umbilical port site.  The pursestring suture was used to close the umbilical fascia.    We again inspected the right upper quadrant for hemostasis.  Pneumoperitoneum was released as we removed the trocars.  4-0 Monocryl was used to close the skin.   Benzoin, steri-strips, and clean dressings were applied. The patient was then extubated and brought to the recovery room in stable condition. Instrument, sponge, and needle counts were correct at closure and at the conclusion of the case.   Findings: Chronic Cholecystitis with Cholelithiasis  Estimated Blood Loss: less than 50 mL         Drains: none         Specimens: Gallbladder           Complications: None; patient tolerated the procedure well.         Disposition: PACU - hemodynamically stable.         Condition: stable  Nancy [name]. [name], MD, [facility] Surgery  General/ Trauma Surgery   03/05/2020 12:35 PM

## 2020-03-05 NOTE — Progress Notes (Signed)
Pancreatitis, acute  Subjective: Pt with gallstones and sludge on [place].  Ready for OR today  Objective: Vital signs in last 24 hours: Temp:  [98.3 F (36.8 C)-99 F (37.2 C)] 98.3 F (36.8 C) (03/20 0402) Pulse Rate:  [70-80] 70 (03/20 0402) Resp:  [17-18] 18 (03/20 0402) BP: (138-163)/(78-87) 156/78 (03/20 0402) SpO2:  [98 %-100 %] 100 % (03/20 0402) Last BM Date: 03/04/20  Intake/Output from previous day: 03/19 0701 - 03/20 0700 In: 860 [P.O.:840; I.V.:20] Out: 1051 [Urine:1051] Intake/Output this shift: No intake/output data recorded.  General appearance: alert and cooperative GI: normal findings: soft, non-tender  Lab Results:  Results for orders placed or performed during the hospital encounter of 02/29/20 (from the past 24 hour(s))  Glucose, capillary     Status: Abnormal   Collection Time: 03/04/20 11:38 AM  Result Value Ref Range   Glucose-Capillary 202 (H) 70 - 99 mg/dL  Glucose, capillary     Status: Abnormal   Collection Time: 03/04/20  3:30 PM  Result Value Ref Range   Glucose-Capillary 192 (H) 70 - 99 mg/dL  Glucose, capillary     Status: Abnormal   Collection Time: 03/04/20  7:48 PM  Result Value Ref Range   Glucose-Capillary 148 (H) 70 - 99 mg/dL  Glucose, capillary     Status: Abnormal   Collection Time: 03/04/20  9:04 PM  Result Value Ref Range   Glucose-Capillary 132 (H) 70 - 99 mg/dL  Glucose, capillary     Status: Abnormal   Collection Time: 03/04/20 11:35 PM  Result Value Ref Range   Glucose-Capillary 214 (H) 70 - 99 mg/dL  Glucose, capillary     Status: Abnormal   Collection Time: 03/05/20  4:00 AM  Result Value Ref Range   Glucose-Capillary 185 (H) 70 - 99 mg/dL   Comment 1 Notify RN    Comment 2 Document in Chart   Glucose, capillary     Status: Abnormal   Collection Time: 03/05/20  7:23 AM  Result Value Ref Range   Glucose-Capillary 130 (H) 70 - 99 mg/dL     Studies/Results Radiology     MEDS, Scheduled . amLODipine  10 mg  Oral Daily  . aspirin EC  81 mg Oral Daily  . atorvastatin  20 mg Oral Daily  . Chlorhexidine Gluconate Cloth  6 each Topical Daily  . DULoxetine  20 mg Oral Daily  . enoxaparin (LOVENOX) injection  40 mg Subcutaneous Q24H  . ezetimibe  10 mg Oral Daily  . insulin aspart  0-9 Units Subcutaneous Q4H  . metoprolol tartrate  25 mg Oral BID  . pantoprazole  40 mg Oral BID  . risperiDONE  2 mg Oral QHS  . senna-docusate  2 tablet Oral BID     Assessment: Pancreatitis, acute Presumed etiology is gallstones  Plan: Plan OR today for lap chole.  The anatomy & physiology of hepatobiliary & pancreatic function was discussed.  The pathophysiology of gallbladder dysfunction was discussed.  Natural history risks without surgery was discussed.   I feel the risks of no intervention will lead to serious problems that outweigh the operative risks; therefore, I recommended cholecystectomy to remove the pathology.  I explained laparoscopic techniques with possible need for an open approach.  Probable cholangiogram to evaluate the bilary tract was explained as well.    Risks such as bleeding, infection, abscess, leak, injury to other organs, need for further treatment, heart attack, death, and other risks were discussed.  I noted a good  likelihood this will help address the problem.  Possibility that this will not correct all abdominal symptoms was explained.  Goals of post-operative recovery were discussed as well.  We will work to minimize complications.  An educational handout further explaining the pathology and treatment options was given as well.  Questions were answered.  The patient expresses understanding & wishes to proceed with surgery.  LOS: 5 days    Rosario Adie, MD [facility], [place]    03/05/2020 8:11 AM

## 2020-03-05 NOTE — Anesthesia Preprocedure Evaluation (Addendum)
Anesthesia Evaluation  Patient identified by MRN, date of birth, ID band Patient awake    Reviewed: Allergy & Precautions, NPO status , Patient's Chart, lab work & pertinent test results  Airway Mallampati: I  TM Distance: >3 FB Neck ROM: Full    Dental  (+) Partial Upper, Dental Advisory Given   Pulmonary former smoker,    breath sounds clear to auscultation       Cardiovascular hypertension, Pt. on medications  Rhythm:Regular Rate:Normal     Neuro/Psych PSYCHIATRIC DISORDERS Depression CVA    GI/Hepatic Neg liver ROS, GERD  Medicated,  Endo/Other  diabetes, Type 2, Insulin Dependent  Renal/GU negative Renal ROS     Musculoskeletal   Abdominal (+) + obese,   Peds  Hematology   Anesthesia Other Findings - HLD  Reproductive/Obstetrics                            Anesthesia Physical Anesthesia Plan  ASA: II  Anesthesia Plan: General   Post-op Pain Management:    Induction: Intravenous  PONV Risk Score and Plan: 4 or greater and Dexamethasone, Ondansetron, Midazolam and Scopolamine patch - Pre-op  Airway Management Planned: Oral ETT  Additional Equipment: None  Intra-op Plan:   Post-operative Plan: Extubation in OR  Informed Consent: I have reviewed the patients History and Physical, chart, labs and discussed the procedure including the risks, benefits and alternatives for the proposed anesthesia with the patient or authorized representative who has indicated his/her understanding and acceptance.     Dental advisory given  Plan Discussed with: CRNA  Anesthesia Plan Comments:        Anesthesia Quick Evaluation

## 2020-03-06 LAB — CBC
HCT: 30 % — ABNORMAL LOW (ref 36.0–46.0)
Hemoglobin: 9.7 g/dL — ABNORMAL LOW (ref 12.0–15.0)
MCH: 29.1 pg (ref 26.0–34.0)
MCHC: 32.3 g/dL (ref 30.0–36.0)
MCV: 90.1 fL (ref 80.0–100.0)
Platelets: 249 10*3/uL (ref 150–400)
RBC: 3.33 MIL/uL — ABNORMAL LOW (ref 3.87–5.11)
RDW: 12.3 % (ref 11.5–15.5)
WBC: 13.6 10*3/uL — ABNORMAL HIGH (ref 4.0–10.5)
nRBC: 0 % (ref 0.0–0.2)

## 2020-03-06 LAB — COMPREHENSIVE METABOLIC PANEL
ALT: 37 U/L (ref 0–44)
AST: 32 U/L (ref 15–41)
Albumin: 2.8 g/dL — ABNORMAL LOW (ref 3.5–5.0)
Alkaline Phosphatase: 44 U/L (ref 38–126)
Anion gap: 6 (ref 5–15)
BUN: 21 mg/dL — ABNORMAL HIGH (ref 6–20)
CO2: 26 mmol/L (ref 22–32)
Calcium: 8.5 mg/dL — ABNORMAL LOW (ref 8.9–10.3)
Chloride: 105 mmol/L (ref 98–111)
Creatinine, Ser: 0.97 mg/dL (ref 0.44–1.00)
GFR calc Af Amer: >60 mL/min (ref >60)
GFR calc non Af Amer: >60 mL/min (ref >60)
Glucose, Bld: 179 mg/dL — ABNORMAL HIGH (ref 70–99)
Potassium: 4.2 mmol/L (ref 3.5–5.1)
Sodium: 137 mmol/L (ref 135–145)
Total Bilirubin: 0.7 mg/dL (ref 0.3–1.2)
Total Protein: 5.9 g/dL — ABNORMAL LOW (ref 6.5–8.1)

## 2020-03-06 LAB — GLUCOSE, CAPILLARY
Glucose-Capillary: 147 mg/dL — ABNORMAL HIGH (ref 70–99)
Glucose-Capillary: 154 mg/dL — ABNORMAL HIGH (ref 70–99)
Glucose-Capillary: 156 mg/dL — ABNORMAL HIGH (ref 70–99)
Glucose-Capillary: 174 mg/dL — ABNORMAL HIGH (ref 70–99)
Glucose-Capillary: 216 mg/dL — ABNORMAL HIGH (ref 70–99)

## 2020-03-06 LAB — SARS CORONAVIRUS 2 (TAT 6-24 HRS): SARS Coronavirus 2: NEGATIVE

## 2020-03-06 MED ORDER — INSULIN ASPART 100 UNIT/ML ~~LOC~~ SOLN
0.0000 [IU] | Freq: Three times a day (TID) | SUBCUTANEOUS | Status: DC
  Administered 2020-03-06 – 2020-03-07 (×3): 1 [IU] via SUBCUTANEOUS
  Administered 2020-03-07: 2 [IU] via SUBCUTANEOUS

## 2020-03-06 MED ORDER — OXYCODONE HCL 5 MG PO TABS: 5.0000 mg | ORAL_TABLET | ORAL | 0 refills | Status: DC | PRN

## 2020-03-06 MED ORDER — PANTOPRAZOLE SODIUM 40 MG IV SOLR
40.0000 mg | Freq: Two times a day (BID) | INTRAVENOUS | Status: DC
  Administered 2020-03-06 – 2020-03-07 (×3): 40 mg via INTRAVENOUS
  Filled 2020-03-06 (×3): qty 40

## 2020-03-06 MED ORDER — MORPHINE SULFATE (PF) 2 MG/ML IV SOLN
1.0000 mg | Freq: Once | INTRAVENOUS | Status: AC
  Administered 2020-03-06: 1 mg via INTRAVENOUS
  Filled 2020-03-06: qty 1

## 2020-03-06 MED ORDER — PROMETHAZINE HCL 25 MG/ML IJ SOLN
12.5000 mg | Freq: Four times a day (QID) | INTRAMUSCULAR | Status: DC | PRN
  Administered 2020-03-06: 12.5 mg via INTRAVENOUS
  Filled 2020-03-06: qty 1

## 2020-03-06 NOTE — Progress Notes (Signed)
At request of provider in anticipation of probable D/Con Mon 3/22, Tracey in Admission at [facility] confirms an updated COVID test will be needed prior to pt going to [facility].  Provider has initiated COVID test, as requested.  CSW will continue to follow for D/C needs.  Alphonse Guild. Jelesa Mangini  MSW, LCSW, LCAS, CSI Transitions of Care Clinical Social Worker Care Coordination Department Ph: [phone]

## 2020-03-06 NOTE — Discharge Instructions (Signed)
LAPAROSCOPIC SURGERY: POST OP INSTRUCTIONS ° °1. DIET: Follow a light bland diet the first 24 hours after arrival home, such as soup, liquids, crackers, etc.  Be sure to include lots of fluids daily.  Avoid fast food or heavy meals as your are more likely to get nauseated.  Eat a low fat the next few days after surgery.   °2. Take your usually prescribed home medications unless otherwise directed. °3. PAIN CONTROL: °a. Pain is best controlled by a usual combination of three different methods TOGETHER: °i. Ice/Heat °ii. Over the counter pain medication °iii. Prescription pain medication °b. Most patients will experience some swelling and bruising around the incisions.  Ice packs or heating pads (30-60 minutes up to 6 times a day) will help. Use ice for the first few days to help decrease swelling and bruising, then switch to heat to help relax tight/sore spots and speed recovery.  Some people prefer to use ice alone, heat alone, alternating between ice & heat.  Experiment to what works for you.  Swelling and bruising can take several weeks to resolve.   °c. It is helpful to take an over-the-counter pain medication regularly for the first few weeks.  Choose one of the following that works best for you: °i. Naproxen (Aleve, etc)  Two 220mg tabs twice a day °ii. Ibuprofen (Advil, etc) Three 200mg tabs four times a day (every meal & bedtime) °d. A  prescription for pain medication (such as percocet, vicodin, oxycodone, hydrocodone, etc) should be given to you upon discharge.  Take your pain medication as prescribed.  °i. If you are having problems/concerns with the prescription medicine (does not control pain, nausea, vomiting, rash, itching, etc), please call us [phone] to see if we need to switch you to a different pain medicine that will work better for you and/or control your side effect better. °ii. If you need a refill on your pain medication, please contact your pharmacy.  They will contact our office to  request authorization. Prescriptions will not be filled after 5 pm or on week-ends. ° ° °4. Avoid getting constipated.  Between the surgery and the pain medications, it is common to experience some constipation.  Increasing fluid intake and taking a fiber supplement (such as Metamucil, Citrucel, FiberCon, MiraLax, etc) 1-2 times a day regularly will usually help prevent this problem from occurring.  A mild laxative (prune juice, Milk of Magnesia, MiraLax, etc) should be taken according to package directions if there are no bowel movements after 48 hours.   °5. Watch out for diarrhea.  If you have many loose bowel movements, simplify your diet to bland foods & liquids for a few days.  Stop any stool softeners and decrease your fiber supplement.  Switching to mild anti-diarrheal medications (Kayopectate, Pepto Bismol) can help.  If this worsens or does not improve, please call us. °6. Wash / shower every day.  You may shower over the dressings as they are waterproof.  Continue to shower over incision(s) after the dressing is off. °7. Remove your waterproof bandages 5 days after surgery.  You may leave the incision open to air.  You may replace a dressing/Band-Aid to cover the incision for comfort if you wish.  °8. ACTIVITIES as tolerated:   °a. You may resume regular (light) daily activities beginning the next day--such as daily self-care, walking, climbing stairs--gradually increasing activities as tolerated.  If you can walk 30 minutes without difficulty, it is safe to try more intense activity such as   jogging, treadmill, bicycling, low-impact aerobics, swimming, etc. °b. Save the most intensive and strenuous activity for last such as sit-ups, heavy lifting, contact sports, etc  Refrain from any heavy lifting or straining until you are off narcotics for pain control.   °c. DO NOT PUSH THROUGH PAIN.  Let pain be your guide: If it hurts to do something, don't do it.  Pain is your body warning you to avoid that  activity for another week until the pain goes down. °d. You may drive when you are no longer taking prescription pain medication, you can comfortably wear a seatbelt, and you can safely maneuver your car and apply brakes. °e. You may have sexual intercourse when it is comfortable.  °9. FOLLOW UP in our office °a. Please call [facility] at [phone] to set up an appointment to see your surgeon in the office for a follow-up appointment approximately 2-3 weeks after your surgery. °b. Make sure that you call for this appointment the day you arrive home to insure a convenient appointment time. °10. IF YOU HAVE DISABILITY OR FAMILY LEAVE FORMS, BRING THEM TO THE OFFICE FOR PROCESSING.  DO NOT GIVE THEM TO YOUR DOCTOR. ° ° °WHEN TO CALL US [phone]: °1. Poor pain control °2. Reactions / problems with new medications (rash/itching, nausea, etc)  °3. Fever over 101.5 F (38.5 C) °4. Inability to urinate °5. Nausea and/or vomiting °6. Worsening swelling or bruising °7. Continued bleeding from incision. °8. Increased pain, redness, or drainage from the incision ° ° The clinic staff is available to answer your questions during regular business hours (8:30am-5pm).  Please don’t hesitate to call and ask to speak to one of our nurses for clinical concerns.  ° If you have a medical emergency, go to the nearest emergency room or call 911. ° A surgeon from [facility] is always on call at the hospitals ° ° °[facility] °[address], [address], [place], [place]  [address] ? °MAIN: [phone] ? TOLL FREE: [phone] ?  °FAX [phone] °[email or web address] ° ° °

## 2020-03-06 NOTE — Progress Notes (Signed)
Patient complained of chest pain. Vital signs and EKG obtained. Skin warm and dry. Color WNL. Alert and oriented. Patient reports this has occurred before today at home. Dr Nevada Crane paged to make her aware. Donne Hazel, RN

## 2020-03-06 NOTE — Progress Notes (Signed)
PROGRESS NOTE  [name] ASN:[id number] DOB: [birth year] DOA: 02/29/2020 PCP: [name], MD  HPI/Recap of past 24 hours:  Nancy [name] is a 59 y.o. female with medical history significant for essential hypertension, CVA, right carotid artery stenosis, diabetic gastroparesis, type 2 diabetes, neoplasm of parotid gland, GERD, hyperlipidemia, gait abnormality who presented to [facility] ED with complaints of worsening nausea of 2 weeks duration.  Associated with vomiting, abdominal pain, and poor oral intake.  Denies a personal history of gallstones or pancreatitis.  No use of alcoholic beverage.  No significant weight loss.  CT scan abdomen pelvis with contrast done in the ED showed evidence of acute pancreatitis with gallbladder sludge and possible gallstones.   TRH asked to admit.  Initial Lipase elevated at 54, normalized.  MRI abdomen negative for mass but showing acute pancreatitis.  Seen by GI.  EGD completed 03/03/20 by Dr. [name]: normal.  General surgery contacted due to presumptive gallstone pancreatitis.  RUQ abdominal US confirmed gallstones.  Taken to OR on 3/20 for lap chole.  3/21:  Minimal pain this AM.  No new complaints.  + Flatus.  Tolerating a diet.  TOC assisting with placement.     Assessment/Plan: Principal Problem:   Pancreatitis, acute Active Problems:   Generalized abdominal pain   Resolved Acute gallstone pancreatitis post lap chole 3/20 Dr. [name] Right upper quadrant ultrasound 3/19 confirmed gallstones. Taken to the OR on 03/05/2020 for laparoscopic cholecystectomy to reduce her risk of having recurrent gallstone pancreatitis. Seen by GI.  EGD normal 2015, EGD 3/18 Dr. [name] normal Will need to follow up with surgery post op Pain well controlled Tolerating a diet  Resolved leukocytosis likely reactive in the setting of acute gallstone pancreatitis.   WBC 13.3K>> 13.5K>.10.9K>> 9K  Resolved AKI likely prerenal 2/2 to dehydration from poor  oral intake Presented with elevated creatinine, peaked at 1.7. Creatinine is back to her baseline 0.8 with GFR greater than 60.  Hyperlipidemia Triglycerides normal Continue home medications  Type 2 diabetes with hyperglycemia Hemoglobin A1c 10.8 on 02/29/2020. Currently well controlled on current management  Uncontrolled hypertension  Blood pressure is stable. Continue to hold prior to admission HCTZ and lisinopril due to recent AKI.   Continue metoprolol 25 mg twice daily and Norvasc 10 mg daily.  norvasc 10 mg Continue to monitor vital signs.  Resolved sinus tachycardia likely in the setting of dehydration from poor oral intake Received IV fluid  Resolved chronic constipation Had a bowel movement 03/03/2020 Continue bowel regimen  Left heel wound, POA Wound care specialist for local wound care Continue local wound care  History of CVA Continue home medications  Ambulatory dysfunction post CVA/physical debility Uses a cane to ambulate at baseline TOC assisting with placement    DVT prophylaxis: Sq Lovenox daily   Code Status: Full code  Family Communication:  Will call family if okay with the patient.  Disposition Plan:  Patient is from home.  Anticipate discharge to [facility] likely tomorrow 03/07/2020.  Consults called: GI, general surgery.    Objective: Vitals:   03/05/20 2044 03/06/20 0207 03/06/20 0705 03/06/20 1001  BP: (!) 157/87 137/67 133/63 122/67  Pulse: 91 74 70 75  Resp: 16 16 18 16   Temp: 98.1 F (36.7 C) 99 F (37.2 C) 98 F (36.7 C) 98.2 F (36.8 C)  TempSrc: Oral Oral Oral Oral  SpO2: 99% 100% 98% 100%  Weight:      Height:  Intake/Output Summary (Last 24 hours) at 03/06/2020 1239 Last data filed at 03/06/2020 1000 Gross per 24 hour  Intake 2054.03 ml  Output --  Net 2054.03 ml   Filed Weights   02/29/20 1304 02/29/20 1954  Weight: 91.6 kg 91.6 kg    Exam:  . General: 59 y.o. year-old female  well-developed well-nourished no acute distress.  Alert and oriented x 3.   . Cardiovascular: Regular rate and rhythm no rubs or gallops. Marland Kitchen Respiratory: Clear to auscultation no wheezes or rales.   . Abdomen: Soft nontender normal bowel sounds present.  Dressings clean with no drainage.   . Musculoskeletal: No lower extremity edema bilaterally.   Marland Kitchen Psychiatry: Mood is appropriate for condition and setting.  Data Reviewed: CBC: Recent Labs  Lab 02/29/20 1442 03/01/20 1010 03/03/20 0341 03/05/20 0850 03/06/20 0325  WBC 13.3* 13.5* 10.9* 9.0 13.6*  NEUTROABS  --  10.8* 4.9 4.3  --   HGB 13.4 11.5* 9.7* 10.5* 9.7*  HCT 41.8 35.5* 31.4* 33.3* 30.0*  MCV 90.5 89.4 93.5 90.0 90.1  PLT 290 254 222 231 468   Basic Metabolic Panel: Recent Labs  Lab 02/29/20 1315 02/29/20 1315 03/01/20 1010 03/03/20 0341 03/04/20 0337 03/05/20 0850 03/06/20 0325  NA 139  --  140 139  --  142 137  K 4.9  --  4.4 4.3  --  4.0 4.2  CL 100  --  107 107  --  108 105  CO2 22  --  23 25  --  26 26  GLUCOSE 249*  --  178* 105*  --  112* 179*  BUN 23*  --  27* 36*  --  24* 21*  CREATININE 1.20*   < > 1.31* 1.72* 1.09* 0.86 0.97  CALCIUM 9.6  --  8.9 8.4*  --  8.9 8.5*  MG  --   --   --  2.2  --  1.9  --   PHOS  --   --   --   --   --  2.3*  --    < > = values in this interval not displayed.   GFR: Estimated Creatinine Clearance: 67.1 mL/min (by C-G formula based on SCr of 0.97 mg/dL). Liver Function Tests: Recent Labs  Lab 02/29/20 1315 03/01/20 1010 03/03/20 0341 03/05/20 0850 03/06/20 0325  AST 19 11* 11* 16 32  ALT 18 14 13 22  37  ALKPHOS 68 57 44 50 44  BILITOT 0.9 0.5 0.4 0.7 0.7  PROT 8.3* 7.4 6.3* 6.3* 5.9*  ALBUMIN 4.1 3.4* 3.0* 3.1* 2.8*   Recent Labs  Lab 02/29/20 1315 02/29/20 1407 03/01/20 1010 03/02/20 0422 03/03/20 0341  LIPASE 54* 53* 33 21 20   No results for input(s): AMMONIA in the last 168 hours. Coagulation Profile: No results for input(s): INR, PROTIME in the  last 168 hours. Cardiac Enzymes: No results for input(s): CKTOTAL, CKMB, CKMBINDEX, TROPONINI in the last 168 hours. BNP (last 3 results) No results for input(s): PROBNP in the last 8760 hours. HbA1C: No results for input(s): HGBA1C in the last 72 hours. CBG: Recent Labs  Lab 03/05/20 1559 03/05/20 2000 03/06/20 0016 03/06/20 0419 03/06/20 1136  GLUCAP 176* 271* 216* 154* 174*   Lipid Profile: No results for input(s): CHOL, HDL, LDLCALC, TRIG, CHOLHDL, LDLDIRECT in the last 72 hours. Thyroid Function Tests: No results for input(s): TSH, T4TOTAL, FREET4, T3FREE, THYROIDAB in the last 72 hours. Anemia Panel: No results for input(s): VITAMINB12, FOLATE, FERRITIN, TIBC, IRON,  RETICCTPCT in the last 72 hours. Urine analysis:    Component Value Date/Time   COLORURINE YELLOW 02/29/2020 1315   APPEARANCEUR CLEAR 02/29/2020 1315   LABSPEC 1.041 (H) 02/29/2020 1315   PHURINE 6.0 02/29/2020 1315   GLUCOSEU >=500 (A) 02/29/2020 1315   HGBUR NEGATIVE 02/29/2020 1315   BILIRUBINUR NEGATIVE 02/29/2020 1315   BILIRUBINUR negative 12/26/2017 1458   KETONESUR 20 (A) 02/29/2020 1315   PROTEINUR 100 (A) 02/29/2020 1315   UROBILINOGEN 0.2 12/26/2017 1458   UROBILINOGEN 0.2 09/07/2014 0857   NITRITE NEGATIVE 02/29/2020 1315   LEUKOCYTESUR NEGATIVE 02/29/2020 1315   Sepsis Labs: @LABRCNTIP (procalcitonin:4,lacticidven:4)  ) Recent Results (from the past 240 hour(s))  Respiratory Panel by RT PCR (Flu A&B, Covid) - Nasopharyngeal Swab     Status: None   Collection Time: 03/03/20 11:15 AM   Specimen: Nasopharyngeal Swab  Result Value Ref Range Status   SARS Coronavirus 2 by RT PCR NEGATIVE NEGATIVE Final    Comment: (NOTE) SARS-CoV-2 target nucleic acids are NOT DETECTED. The SARS-CoV-2 RNA is generally detectable in upper respiratoy specimens during the acute phase of infection. The lowest concentration of SARS-CoV-2 viral copies this assay can detect is 131 copies/mL. A negative result  does not preclude SARS-Cov-2 infection and should not be used as the sole basis for treatment or other patient management decisions. A negative result may occur with  improper specimen collection/handling, submission of specimen other than nasopharyngeal swab, presence of viral mutation(s) within the areas targeted by this assay, and inadequate number of viral copies (<131 copies/mL). A negative result must be combined with clinical observations, patient history, and epidemiological information. The expected result is Negative. Fact Sheet for Patients:  PinkCheek.be Fact Sheet for Healthcare Providers:  GravelBags.it This test is not yet ap proved or cleared by the [place] FDA and  has been authorized for detection and/or diagnosis of SARS-CoV-2 by FDA under an Emergency Use Authorization (EUA). This EUA will remain  in effect (meaning this test can be used) for the duration of the COVID-19 declaration under Section 564(b)(1) of the Act, 21 U.S.C. section 360bbb-3(b)(1), unless the authorization is terminated or revoked sooner.    Influenza A by PCR NEGATIVE NEGATIVE Final   Influenza B by PCR NEGATIVE NEGATIVE Final    Comment: (NOTE) The Xpert Xpress SARS-CoV-2/FLU/RSV assay is intended as an aid in  the diagnosis of influenza from Nasopharyngeal swab specimens and  should not be used as a sole basis for treatment. Nasal washings and  aspirates are unacceptable for Xpert Xpress SARS-CoV-2/FLU/RSV  testing. Fact Sheet for Patients: PinkCheek.be Fact Sheet for Healthcare Providers: GravelBags.it This test is not yet approved or cleared by the [place] FDA and  has been authorized for detection and/or diagnosis of SARS-CoV-2 by  FDA under an Emergency Use Authorization (EUA). This EUA will remain  in effect (meaning this test can be used) for the duration of  the  Covid-19 declaration under Section 564(b)(1) of the Act, 21  U.S.C. section 360bbb-3(b)(1), unless the authorization is  terminated or revoked. Performed at [facility], [place] [address], [place], [place] [address]   MRSA PCR Screening     Status: None   Collection Time: 03/05/20  8:40 AM   Specimen: Nasopharyngeal  Result Value Ref Range Status   MRSA by PCR NEGATIVE NEGATIVE Final    Comment:        The GeneXpert MRSA Assay (FDA approved for NASAL specimens only), is one component of a comprehensive  MRSA colonization surveillance program. It is not intended to diagnose MRSA infection nor to guide or monitor treatment for MRSA infections. Performed at [facility], [place] [address] [place], [place] [address]       Studies: No results found.  Scheduled Meds: . amLODipine  10 mg Oral Daily  . aspirin EC  81 mg Oral Daily  . atorvastatin  20 mg Oral Daily  . Chlorhexidine Gluconate Cloth  6 each Topical Daily  . DULoxetine  20 mg Oral Daily  . enoxaparin (LOVENOX) injection  40 mg Subcutaneous Q24H  . ezetimibe  10 mg Oral Daily  . insulin aspart  0-9 Units Subcutaneous Q4H  . metoprolol tartrate  25 mg Oral BID  . pantoprazole  40 mg Oral BID  . risperiDONE  2 mg Oral QHS  . senna-docusate  2 tablet Oral BID    Continuous Infusions: . lactated ringers 75 mL/hr at 03/05/20 2141     LOS: 6 days     Kayleen Memos, MD [facility] Pager [phone]  If 7PM-7AM, please contact night-coverage [email or web address] Password [facility] 03/06/2020, 12:39 PM

## 2020-03-06 NOTE — Progress Notes (Addendum)
TOC CSW reviewed chart and sees that on FRi 3/19 CSW :   3/19: Patient is agreeable to [place], SNF initiated authorization, but per Olivia Mackie at [facility], pt will not get answer until mon 3/22.  RN CM updated.  CSW will continue to follow for D/C needs.  Alphonse Guild. Laramie Gelles  MSW, LCSW, LCAS, CSI Transitions of Care Clinical Social Worker Care Coordination Department Ph: [phone]

## 2020-03-06 NOTE — Progress Notes (Signed)
Patient vomiting; on phone with Dr Nevada Crane; orders noted. Donne Hazel, RN

## 2020-03-06 NOTE — Progress Notes (Signed)
Rapid Covid swab obtained and sent to lab. Donne Hazel, RN

## 2020-03-06 NOTE — Progress Notes (Signed)
1 Day Post-Op lap chole Subjective: Doing well.  Pain controlled  Objective: Vital signs in last 24 hours: Temp:  [97.8 F (36.6 C)-99 F (37.2 C)] 98 F (36.7 C) (03/21 0705) Pulse Rate:  [70-91] 70 (03/21 0705) Resp:  [10-18] 18 (03/21 0705) BP: (124-157)/(59-87) 133/63 (03/21 0705) SpO2:  [97 %-100 %] 98 % (03/21 0705)   Intake/Output from previous day: 03/20 0701 - 03/21 0700 In: 1231.9 [P.O.:240; I.V.:991.9] Out: 10 [Blood:10] Intake/Output this shift: No intake/output data recorded.   General appearance: alert and cooperative GI: normal findings: soft, non-tender  Incision: no significant drainage  Lab Results:  Recent Labs    03/05/20 0850 03/06/20 0325  WBC 9.0 13.6*  HGB 10.5* 9.7*  HCT 33.3* 30.0*  PLT 231 249   BMET Recent Labs    03/05/20 0850 03/06/20 0325  NA 142 137  K 4.0 4.2  CL 108 105  CO2 26 26  GLUCOSE 112* 179*  BUN 24* 21*  CREATININE 0.86 0.97  CALCIUM 8.9 8.5*   PT/INR No results for input(s): LABPROT, INR in the last 72 hours. ABG No results for input(s): PHART, HCO3 in the last 72 hours.  Invalid input(s): PCO2, PO2  MEDS, Scheduled . amLODipine  10 mg Oral Daily  . aspirin EC  81 mg Oral Daily  . atorvastatin  20 mg Oral Daily  . Chlorhexidine Gluconate Cloth  6 each Topical Daily  . DULoxetine  20 mg Oral Daily  . enoxaparin (LOVENOX) injection  40 mg Subcutaneous Q24H  . ezetimibe  10 mg Oral Daily  . insulin aspart  0-9 Units Subcutaneous Q4H  . metoprolol tartrate  25 mg Oral BID  . pantoprazole  40 mg Oral BID  . risperiDONE  2 mg Oral QHS  . senna-docusate  2 tablet Oral BID    Studies/Results: DG Cholangiogram Operative  Result Date: 03/05/2020 CLINICAL DATA:  59 year old female undergoing laparoscopic cholecystectomy with intraoperative cholangiogram EXAM: INTRAOPERATIVE CHOLANGIOGRAM TECHNIQUE: Cholangiographic images from the C-arm fluoroscopic device were submitted for interpretation post-operatively.  Please see the procedural report for the amount of contrast and the fluoroscopy time utilized. COMPARISON:  Abdominal ultrasound 03/04/2020 FINDINGS: A single cine clip is submitted for interpretation. There is attempted intraoperative cholangiogram with partial filling of the cystic duct. However, the majority of the injected contrast material extravasates into the peritoneal space. IMPRESSION: Unsuccessful intraoperative cholangiogram. Electronically Signed   By: Jacqulynn Cadet M.D.   On: 03/05/2020 12:27   US Abdomen Limited RUQ  Result Date: 03/04/2020 CLINICAL DATA:  Gallstone pancreatitis EXAM: ULTRASOUND ABDOMEN LIMITED RIGHT UPPER QUADRANT COMPARISON:  CT 02/29/2020 FINDINGS: Gallbladder: Multiple small stones/gravel layering in the gallbladder. No wall thickening or sonographic Murphy sign. Common bile duct: Diameter: Normal caliber, 4 mm Liver: No focal lesion identified. Within normal limits in parenchymal echogenicity. Portal vein is patent on color Doppler imaging with normal direction of blood flow towards the liver. Other: None. IMPRESSION: Numerous small layering gallstones. No evidence of acute cholecystitis. Electronically Signed   By: Rolm Baptise M.D.   On: 03/04/2020 22:49    Assessment: s/p Procedure(s): LAPAROSCOPIC CHOLECYSTECTOMY WITH INTRAOPERATIVE CHOLANGIOGRAM Patient Active Problem List   Diagnosis Date Noted  . Generalized abdominal pain   . Pancreatitis, acute 02/29/2020  . Cerebrovascular accident (CVA) ([place]) 11/08/2019  . Stenosis of right carotid artery 11/08/2019  . Hyperlipidemia associated with type 2 diabetes mellitus ([place]) 11/08/2019  . Cortical age-related cataract of both eyes 08/30/2019  . Gait abnormality 08/30/2019  .  Statin declined 08/30/2019  . Gastroesophageal reflux disease without esophagitis 04/18/2018  . 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 Uncontrolled type 2 diabetes mellitus with diabetic neuropathy, with long-term current use of insulin ([place])  10/29/2017  . History of macular degeneration 10/29/2017  . Chronic cervical pain 10/29/2016  . Myalgia 09/14/2016  . Insomnia 09/14/2016  . Tinea pedis of both feet 08/20/2016  . Macular degeneration 08/17/2016  . Low back pain 09/21/2014  . Hypertension 09/21/2014  . Diabetic gastroparesis associated with type 2 diabetes mellitus ([place]) 09/14/2014  . Thyroid nodule 08/17/2014  . Obesity (BMI 30-39.9) 08/17/2014  . Hyperlipidemia 08/05/2014  . Type II diabetes mellitus with neurological manifestations, uncontrolled ([place]) 08/04/2014  . Elevated serum protein level 08/04/2014    Expected post op course  Plan: Advance diet  Ok to d/c today   LOS: 6 days     .Rosario Adie, MD [facility], [place]    03/06/2020 8:21 AM

## 2020-03-06 NOTE — Progress Notes (Signed)
Occupational Therapy Treatment Patient Details Name: Nancy [name] MRN: [id number] DOB: [birth year] Today's Date: 03/06/2020    History of present illness 59 year old female admitted with N/V. ? of acute pancreatitis. PMH includes essential HTN, CVA, Right carotid artery stenosis. Diabetic gastroparesis, neoplasm of partotid gland. Has cataracts (pending surgery)   OT comments  Pt making excellent progress toward OT goals. Focused session on LB ADLS, activity tolerance for BADL routine, and functional mobility progression. Pt plan is to now d/c home. Pt completed bed mobility and sit <> stands at min guard assist with RW. Pt able to engage in LB dressing at set up A. She then completed functional mobility into the bathroom to engage in grooming tasks. Pt then completed functional mobility at min guard level with RW at household distance. Pt will need BSC for d/c home. Updated recs to [facility] with supervision. Will continue to follow.   Follow Up Recommendations  Home health OT;Supervision/Assistance - 24 hour    Equipment Recommendations  3 in 1 bedside commode    Recommendations for Other Services      Precautions / Restrictions Precautions Precautions: Fall Restrictions Weight Bearing Restrictions: No       Mobility Bed Mobility Overal bed mobility: Needs Assistance Bed Mobility: Supine to Sit     Supine to sit: Min guard     General bed mobility comments: increased time and use of rails  Transfers Overall transfer level: Needs assistance Equipment used: Rolling walker (2 wheeled) Transfers: Sit to/from Stand Sit to Stand: Min guard         General transfer comment: min guard for safety with RW usage    Balance Overall balance assessment: Needs assistance Sitting-balance support: Feet supported;No upper extremity supported Sitting balance-Leahy Scale: Good     Standing balance support: Single extremity supported Standing balance-Leahy Scale: Fair Standing  balance comment: able to statically stand at sink without external support                           ADL either performed or assessed with clinical judgement   ADL Overall ADL's : Needs assistance/impaired     Grooming: Set up;Standing Grooming Details (indicate cue type and reason): standing at sink to complete oral care; able to statically stand without UE support             Lower Body Dressing: Set up;Sitting/lateral leans;Bed level Lower Body Dressing Details (indicate cue type and reason): pt donning socks long sitting in bed with set up A, also with improved technique of figure 4 Toilet Transfer: Min guard;RW             General ADL Comments: pt able to progressi n LB dressing and functional mobility this date. Encouraged use of RW for continued safety and promotion of balance     Vision Patient Visual Report: No change from baseline     Perception     Praxis      Cognition Arousal/Alertness: Awake/alert Behavior During Therapy: WFL for tasks assessed/performed Overall Cognitive Status: Within Functional Limits for tasks assessed                                          Exercises     Shoulder Instructions       General Comments      Pertinent Vitals/ Pain  Pain Assessment: Faces Faces Pain Scale: Hurts a little bit Pain Location: abdomen Pain Descriptors / Indicators: Aching Pain Intervention(s): Monitored during session  Home Living                                          Prior Functioning/Environment              Frequency  Min 2X/week        Progress Toward Goals  OT Goals(current goals can now be found in the care plan section)  Progress towards OT goals: Progressing toward goals  Acute Rehab OT Goals Patient Stated Goal: Feel better and be able to go home OT Goal Formulation: With patient Time For Goal Achievement: 03/16/20 Potential to Achieve Goals: Good  Plan  Discharge plan needs to be updated    Co-evaluation                 AM-PAC OT "6 Clicks" Daily Activity     Outcome Measure   Help from another person eating meals?: None Help from another person taking care of personal grooming?: None Help from another person toileting, which includes using toliet, bedpan, or urinal?: A Little Help from another person bathing (including washing, rinsing, drying)?: A Little Help from another person to put on and taking off regular upper body clothing?: A Little Help from another person to put on and taking off regular lower body clothing?: A Little 6 Click Score: 20    End of Session Equipment Utilized During Treatment: Gait belt;Rolling walker  OT Visit Diagnosis: Unsteadiness on feet (R26.81);Other abnormalities of gait and mobility (R26.89);Muscle weakness (generalized) (M62.81);Other symptoms and signs involving cognitive function;Pain;Cognitive communication deficit (R41.841)   Activity Tolerance Patient tolerated treatment well   Patient Left in chair;with call bell/phone within reach   Nurse Communication Mobility status        Time: 5643-3295 OT Time Calculation (min): 15 min  Charges: OT General Charges $OT Visit: 1 Visit OT Treatments $Self Care/Home Management : 8-22 mins  Zenovia Jarred, MSOT, OTR/L Acute Rehabilitation Services [facility] Office Number: [phone]  Zenovia Jarred 03/06/2020, 9:14 AM

## 2020-03-06 NOTE — Progress Notes (Signed)
Inpatient Rehabilitation Admissions Coordinator  Noted OT has updated recs to [facility]. Pt not in need of an inpt rehab admit at current level. We will sign off at this time.  Danne Baxter, RN, MSN Rehab Admissions Coordinator [phone] 03/06/2020 3:13 PM

## 2020-03-07 ENCOUNTER — Inpatient Hospital Stay (HOSPITAL_COMMUNITY): Payer: 59

## 2020-03-07 LAB — CBC
HCT: 31.2 % — ABNORMAL LOW (ref 36.0–46.0)
Hemoglobin: 10.1 g/dL — ABNORMAL LOW (ref 12.0–15.0)
MCH: 28.2 pg (ref 26.0–34.0)
MCHC: 32.4 g/dL (ref 30.0–36.0)
MCV: 87.2 fL (ref 80.0–100.0)
Platelets: 228 10*3/uL (ref 150–400)
RBC: 3.58 MIL/uL — ABNORMAL LOW (ref 3.87–5.11)
RDW: 12.1 % (ref 11.5–15.5)
WBC: 14 10*3/uL — ABNORMAL HIGH (ref 4.0–10.5)
nRBC: 0 % (ref 0.0–0.2)

## 2020-03-07 LAB — COMPREHENSIVE METABOLIC PANEL
ALT: 43 U/L (ref 0–44)
AST: 29 U/L (ref 15–41)
Albumin: 3.2 g/dL — ABNORMAL LOW (ref 3.5–5.0)
Alkaline Phosphatase: 48 U/L (ref 38–126)
Anion gap: 9 (ref 5–15)
BUN: 11 mg/dL (ref 6–20)
CO2: 29 mmol/L (ref 22–32)
Calcium: 9 mg/dL (ref 8.9–10.3)
Chloride: 101 mmol/L (ref 98–111)
Creatinine, Ser: 0.87 mg/dL (ref 0.44–1.00)
GFR calc Af Amer: >60 mL/min (ref >60)
GFR calc non Af Amer: >60 mL/min (ref >60)
Glucose, Bld: 181 mg/dL — ABNORMAL HIGH (ref 70–99)
Potassium: 3.7 mmol/L (ref 3.5–5.1)
Sodium: 139 mmol/L (ref 135–145)
Total Bilirubin: 0.7 mg/dL (ref 0.3–1.2)
Total Protein: 6.5 g/dL (ref 6.5–8.1)

## 2020-03-07 LAB — GLUCOSE, CAPILLARY
Glucose-Capillary: 117 mg/dL — ABNORMAL HIGH (ref 70–99)
Glucose-Capillary: 134 mg/dL — ABNORMAL HIGH (ref 70–99)
Glucose-Capillary: 173 mg/dL — ABNORMAL HIGH (ref 70–99)
Glucose-Capillary: 143 mg/dL — ABNORMAL HIGH (ref 70–99)

## 2020-03-07 MED ORDER — METOCLOPRAMIDE HCL 5 MG PO TABS: 5.0000 mg | ORAL_TABLET | Freq: Three times a day (TID) | ORAL | 0 refills | Status: DC

## 2020-03-07 MED ORDER — INSULIN GLARGINE 100 UNIT/ML ~~LOC~~ SOLN
4.0000 [IU] | Freq: Every day | SUBCUTANEOUS | Status: DC
  Filled 2020-03-07: qty 0.04

## 2020-03-07 MED ORDER — INSULIN ASPART 100 UNIT/ML FLEXPEN: 4.0000 [IU] | PEN_INJECTOR | Freq: Three times a day (TID) | SUBCUTANEOUS | 0 refills | Status: DC

## 2020-03-07 MED ORDER — MAGNESIUM HYDROXIDE 400 MG/5ML PO SUSP
30.0000 mL | Freq: Once | ORAL | Status: AC
  Administered 2020-03-07: 30 mL via ORAL
  Filled 2020-03-07: qty 30

## 2020-03-07 MED ORDER — INSULIN GLARGINE 100 UNIT/ML ~~LOC~~ SOLN
7.0000 [IU] | Freq: Every day | SUBCUTANEOUS | Status: DC
  Filled 2020-03-07: qty 0.07

## 2020-03-07 MED ORDER — METOPROLOL TARTRATE 25 MG PO TABS: 25.0000 mg | ORAL_TABLET | Freq: Two times a day (BID) | ORAL | 0 refills | Status: DC

## 2020-03-07 MED ORDER — AMLODIPINE BESYLATE 10 MG PO TABS: 10.0000 mg | ORAL_TABLET | Freq: Every day | ORAL | 0 refills | Status: DC

## 2020-03-07 MED ORDER — METOCLOPRAMIDE HCL 5 MG PO TABS
5.0000 mg | ORAL_TABLET | Freq: Three times a day (TID) | ORAL | Status: DC
  Administered 2020-03-07 (×2): 5 mg via ORAL
  Filled 2020-03-07 (×2): qty 1

## 2020-03-07 MED ORDER — ONDANSETRON HCL 4 MG PO TABS: 4.0000 mg | ORAL_TABLET | Freq: Three times a day (TID) | ORAL | 0 refills | Status: DC | PRN

## 2020-03-07 MED ORDER — INSULIN GLARGINE 100 UNIT/ML SOLOSTAR PEN: 4.0000 [IU] | PEN_INJECTOR | Freq: Every day | SUBCUTANEOUS | 0 refills | Status: DC

## 2020-03-07 MED ORDER — ONDANSETRON HCL 4 MG PO TABS
4.0000 mg | ORAL_TABLET | Freq: Three times a day (TID) | ORAL | Status: DC | PRN
  Administered 2020-03-07: 4 mg via ORAL
  Filled 2020-03-07: qty 1

## 2020-03-07 MED ORDER — OXYCODONE HCL 5 MG PO TABS: 5.0000 mg | ORAL_TABLET | Freq: Two times a day (BID) | ORAL | 0 refills | Status: AC | PRN

## 2020-03-07 MED ORDER — INSULIN ASPART 100 UNIT/ML ~~LOC~~ SOLN
4.0000 [IU] | Freq: Three times a day (TID) | SUBCUTANEOUS | Status: DC
  Administered 2020-03-07: 4 [IU] via SUBCUTANEOUS

## 2020-03-07 MED FILL — LANTUS SOLOSTAR 100 UNITS/M: 100 | 28 days supply | Qty: 3 | Fill #0

## 2020-03-07 NOTE — Progress Notes (Signed)
[facility] came to transport patient to [facility]. Vitals signs stable and patient in no distress upon discharge.

## 2020-03-07 NOTE — Discharge Summary (Addendum)
Discharge Summary  Nancy [name] UEA:[id number] DOB: June 01, 1961  PCP: Nancy Pier, MD  Admit date: 02/29/2020 Discharge date: 03/07/2020  Time spent: 35 minutes  Recommendations for Outpatient Follow-up:  1. Follow-up with general surgery in 1 week 2. Follow-up with GI within a week 3. Follow-up with your primary care provider in 1-2 weeks 4. Follow-up with your neurologist 5. Continue PT OT with assistance and fall precautions. 6. Repeat CBC on Friday, 26, 2021.    Discharge Diagnoses:  Active Hospital Problems   Diagnosis Date Noted  . Pancreatitis, acute 02/29/2020  . Generalized abdominal pain     Resolved Hospital Problems  No resolved problems to display.    Discharge Condition: Stable  Diet recommendation: Small frequent meals that are low in fats and contain only soluble fiber.  Heart healthy carb modified diet.  Vitals:   03/07/20 0827 03/07/20 1314  BP: (!) 178/86 (!) 172/81  Pulse: 79 82  Resp:  17  Temp:  98.2 F (36.8 C)  SpO2:  98%    History of present illness:  Nancy [name] a 59 y.o.femalewith medical history significant foressential hypertension, CVA, right carotid artery stenosis, diabetic gastroparesis, type 2 diabetes, neoplasm of parotid gland, GERD, hyperlipidemia, gait abnormality who presented to [facility] with complaints of worsening nausea and vomiting of 2 weeks duration. Associated with abdominal pain, and poor oral intake. Denies a personal history of gallstonesor pancreatitis. No use of alcoholic beverage. No significant weight loss. CT scan abdomen pelvis with contrast done in the ED showed evidence of acute pancreatitis with gallbladder sludge and possible gallstones. Nancy [name] to admit.  Initial Lipase elevated at 54, normalized.  MRI abdomen negative for mass but showing acute pancreatitis.  Seen by GI.  EGD completed 03/03/20 by Nancy [name]: normal.  General surgery contacted due to presumptive gallstone  pancreatitis.  RUQ abdominal US confirmed gallstones.  Taken to OR on 3/20 for lap chole.  Had an episode of nausea and vomiting on 3/21.  Nausea intermittent on 03/07/20 no vomiting.  Abdominal xray unremarkakle for any acute findings.  Started po Reglan.  03/07/20: Seen and examined.  She has no new complaints.  She denies nausea or abdominal pain at the time of this exam.  Seen by general surgery, okay to discharge from a surgical standpoint.  Per her bedside RN Nancy [name], during breakfast time "she is eating and no complaints of nausea today."  Complained of nausea later, early this afternoon prior to starting Reglan.  Received po Zofran with improvement.  No vomiting.  She will need to follow-up with GI within 1 week.  For initial management of gastroparesis we started with dietary modifications.  She will need to eat small frequent meals that are low in fats and contain only soluble fiber.  Since her nausea is intermittent, we started po Reglan.  May need a repeat gastric emptying study outpatient.  Defer to her gastroenterologist, if indicated.    GI history: 09/13/2014 gastric emptying study: Borderline abnormal, 31% retention at 120 minutes and normal is less than 30%. 2015 EGD by Nancy [name] in the hospital: Done for epigastric abdominal pain was normal. 2021 EGD by Nancy [name] in the hospital:  Done for abdominal pain, N,V, and recent gallstone pancreatitis was normal.    Hospital Course:  Principal Problem:   Pancreatitis, acute Active Problems:   Generalized abdominal pain  Resolved Acute gallstone pancreatitis post lap chole 3/20 Dr. Georgette [name] Right upper quadrant ultrasound 03/04/20 confirmed gallstones. Recent pancreatitis, presumed  gallstone induced. Taken to the OR on 03/05/2020 for laparoscopic cholecystectomy to reduce her risk of having recurrent gallstone pancreatitis. Seen by GI.  EGD normal 2015, EGD 03/03/20 Nancy [name] normal Follow-up with general surgery and GI  posthospitalization.  Leukocytosis likely reactive in the setting of recent surgery, lap chole.  Acute gallstone pancreatitis.   WBC 13.3K>> 13.5K>.10.9K>> 9K>> 14K Repeated CBC on Friday 03/11/20 in the clinic.  Resolved AKI likely prerenal 2/2 to dehydration from poor oral intake Presented with elevated creatinine, peaked at 1.7. Creatinine is back to her baseline 0.8 with GFR greater than 60.  Hyperlipidemia Triglycerides normal Continue home medications  Type 2 diabetes with hyperglycemia Hemoglobin A1c 10.8 on 02/29/2020. Lantus 4U qhs and novolog 4U TID before meals Follow up with your PCP  History of Gastroparesis For initial management of gastroparesis started with dietary modifications.  She will need to eat small frequent meals that are low in fats and contain only soluble fiber.  Since her nausea is intermittent, we started Reglan.  May need a repeat gastric emptying study.  Defer to her gastroenterologist.  09/13/2014 gastric emptying study: Borderline abnormal, 31% retention at 120 minutes and normal is less than 30%.  Essential hypertension  Continue to hold prior to admission HCTZ and lisinopril due to recent AKI.   Continue metoprolol 25 mg twice daily and Norvasc 10 mg daily.  Follow-up with your PCP.  Resolved sinus tachycardia likely in the setting of dehydration from poor oral intake Received IV fluid  Resolved chronic constipation Continue bowel regimen.  Left heel wound, POA Wound care specialist for local wound care Continue local wound care Recommendations by wound care specialist: [place] Nurse Consult Note Reason for Consult: Consult requested for left heel. Wound type: Chronic healing stage 3 pressure injury Pressure Injury POA: Yes Measurement: 5X5X.1cm Wound bed: pink and dry, surrounded by loose peeling skin Drainage (amount, consistency, odor) no odor, drainage or fluctuance Periwound: pink dry scar tissue surrounding Dressing  procedure/placement/frequency: Foam dressing to protect from further injury and promote healing.  Float heel to reduce pressure.  History of CVA Continue aspirin and Lipitor Follow-up with neurology.  Ambulatory dysfunction post CVA/physical debility Continue PT OT with assistance and fall precautions TOC assisting with SNF placement. Accepted at [facility].  Informed her husband and updated him, he is in agreement.     Code Status:Full code   Consults called:GI, general surgery.     Discharge Exam: BP (!) 172/81 (BP Location: Left Arm)   Pulse 82   Temp 98.2 F (36.8 C)   Resp 17   Ht '5\' 3"'$  (1.6 m)   Wt 91.6 kg   SpO2 98%   BMI 35.78 kg/m  . General: 59 y.o. year-old female well developed well nourished in no acute distress.  Alert and interactive. . Cardiovascular: Regular rate and rhythm with no rubs or gallops.  No thyromegaly or JVD noted.   Marland Kitchen Respiratory: Clear to auscultation with no wheezes or rales. Good inspiratory effort. . Abdomen: Soft nontender nondistended with normal bowel sounds x4 quadrants.  Dressings are intact with no drainage. . Musculoskeletal: Trace lower extremity edema bilaterally. Marland Kitchen Psychiatry: Mood is appropriate for condition and setting  Discharge Instructions You were cared for by a hospitalist during your hospital stay. If you have any questions about your discharge medications or the care you received while you were in the hospital after you are discharged, you can call the unit and asked to speak with the hospitalist on call  if the hospitalist that took care of you is not available. Once you are discharged, your primary care physician will handle any further medical issues. Please note that NO REFILLS for any discharge medications will be authorized once you are discharged, as it is imperative that you return to your primary care physician (or establish a relationship with a primary care physician if you do not have one) for your  aftercare needs so that they can reassess your need for medications and monitor your lab values.   Allergies as of 03/07/2020      Reactions   Hydralazine Hcl    Hair loss   Hydrocodone Other (See Comments)   Upset stomach, itching   Metformin And Related Nausea And Vomiting, Other (See Comments)   Stomach pains    Plaquenil [hydroxychloroquine Sulfate] Hives   Sulfur Hives      Medication List    STOP taking these medications   cyclobenzaprine 5 MG tablet Commonly known as: FLEXERIL   diclofenac sodium 1 % Gel Commonly known as: VOLTAREN   hydrochlorothiazide 12.5 MG tablet Commonly known as: HYDRODIURIL   lisinopril 40 MG tablet Commonly known as: ZESTRIL   traZODone 50 MG tablet Commonly known as: DESYREL     TAKE these medications   acetaminophen 500 MG tablet Commonly known as: TYLENOL Take 2 tablets (1,000 mg total) by mouth every 8 (eight) hours as needed for mild pain or moderate pain.   amLODipine 10 MG tablet Commonly known as: NORVASC Take 1 tablet (10 mg total) by mouth daily. Start taking on: March 08, 2020   aspirin EC 81 MG tablet Take 1 tablet (81 mg total) by mouth daily.   atorvastatin 20 MG tablet Commonly known as: LIPITOR Take 1 tablet (20 mg total) by mouth daily.   DULoxetine 20 MG capsule Commonly known as: CYMBALTA TAKE 1 CAPSULE (20 MG TOTAL) BY MOUTH DAILY.   ezetimibe 10 MG tablet Commonly known as: ZETIA Take 10 mg by mouth daily.   gabapentin 300 MG capsule Commonly known as: NEURONTIN Take 1 capsule (300 mg total) by mouth at bedtime.   insulin aspart 100 UNIT/ML FlexPen Commonly known as: NOVOLOG Inject 4 Units into the skin 3 (three) times daily with meals. What changed:   how much to take  how to take this  when to take this  additional instructions   insulin glargine 100 UNIT/ML Solostar Pen Commonly known as: LANTUS Inject 4 Units into the skin at bedtime. What changed: how much to take     metoCLOPramide 5 MG tablet Commonly known as: REGLAN Take 1 tablet (5 mg total) by mouth 4 (four) times daily -  before meals and at bedtime for 10 days.   metoprolol tartrate 25 MG tablet Commonly known as: LOPRESSOR Take 1 tablet (25 mg total) by mouth 2 (two) times daily.   omeprazole 20 MG capsule Commonly known as: PRILOSEC TAKE 1 CAPSULE (20 MG TOTAL) BY MOUTH DAILY.   ondansetron 4 MG tablet Commonly known as: ZOFRAN Take 1 tablet (4 mg total) by mouth every 8 (eight) hours as needed for nausea or vomiting.   oxyCODONE 5 MG immediate release tablet Commonly known as: Oxy IR/ROXICODONE Take 1 tablet (5 mg total) by mouth 2 (two) times daily as needed for up to 5 days for severe pain or breakthrough pain.   Pen Needles 32G X 4 MM Misc 1 each by Does not apply route 4 (four) times daily.   risperiDONE 2 MG tablet  Commonly known as: RISPERDAL Take 1 tablet (2 mg total) by mouth at bedtime.   senna-docusate 8.6-50 MG tablet Commonly known as: Senokot S Take 1 tablet by mouth daily as needed for moderate constipation.   True Metrix Meter w/Device Kit Use as directed            Durable Medical Equipment  (From admission, onward)         Start     Ordered   03/02/20 1719  For home use only DME Walker rolling  Once    Question Answer Comment  Walker: With 5 Inch Wheels   Patient needs a walker to treat with the following condition Ambulatory dysfunction      03/02/20 1718   03/02/20 1432  For home use only DME 3 n 1  Once     03/02/20 1431         Allergies  Allergen Reactions  . Hydralazine Hcl     Hair loss  . Hydrocodone Other (See Comments)    Upset stomach, itching  . Metformin And Related Nausea And Vomiting and Other (See Comments)    Stomach pains   . Plaquenil [Hydroxychloroquine Sulfate] Hives  . Sulfur Hives    Contact information for follow-up providers    [facility], [place]. Go on 03/29/2020.   Specialty: General  Surgery Why: Follow up appointment scheduled for 10:15 AM. Please arrive 30 min prior to appointment time. Bring photo ID and insurance information.  Contact information: [address] [place] [phone]           Contact information for after-discharge care    Destination    [facility] .   Service: Skilled Nursing Contact information: [address] [place] [phone]                   The results of significant diagnostics from this hospitalization (including imaging, microbiology, ancillary and laboratory) are listed below for reference.    Significant Diagnostic Studies: DG Cholangiogram Operative  Result Date: 03/05/2020 CLINICAL DATA:  59 year old female undergoing laparoscopic cholecystectomy with intraoperative cholangiogram EXAM: INTRAOPERATIVE CHOLANGIOGRAM TECHNIQUE: Cholangiographic images from the C-arm fluoroscopic device were submitted for interpretation post-operatively. Please see the procedural report for the amount of contrast and the fluoroscopy time utilized. COMPARISON:  Abdominal ultrasound 03/04/2020 FINDINGS: A single cine clip is submitted for interpretation. There is attempted intraoperative cholangiogram with partial filling of the cystic duct. However, the majority of the injected contrast material extravasates into the peritoneal space. IMPRESSION: Unsuccessful intraoperative cholangiogram. Electronically Signed   By: Jacqulynn Cadet M.D.   On: 03/05/2020 12:27   CT ABDOMEN PELVIS W CONTRAST  Result Date: 02/29/2020 CLINICAL DATA:  Nausea, vomiting, gastric paresis EXAM: CT ABDOMEN AND PELVIS WITH CONTRAST TECHNIQUE: Multidetector CT imaging of the abdomen and pelvis was performed using the standard protocol following bolus administration of intravenous contrast. CONTRAST:  114m OMNIPAQUE IOHEXOL 300 MG/ML  SOLN COMPARISON:  08/24/2014  FINDINGS: Lower chest: Lung bases are clear. No effusions. Heart is normal size. Hepatobiliary: Layering high-density material within the gallbladder, likely layering stones. Gallbladder is contracted. No focal hepatic abnormality. Pancreas: Subtle haziness noted around the pancreatic head suggesting the possibility of early acute pancreatitis. In addition, there is a subtle low-density area within the inferior pancreatic head measuring approximately 2.5 cm. No pancreatic ductal dilatation. Spleen: No focal abnormality.  Normal size. Adrenals/Urinary Tract: No adrenal abnormality. No focal renal  abnormality. No stones or hydronephrosis. Urinary bladder is unremarkable. Stomach/Bowel: Normal appendix. Moderate stool burden throughout the colon. Stomach, large and small bowel grossly unremarkable. Vascular/Lymphatic: No evidence of aneurysm or adenopathy. Reproductive: Prior hysterectomy.  No adnexal masses. Other: No free fluid or free air. Musculoskeletal: No acute bony abnormality. IMPRESSION: Subtle haziness noted around the pancreatic head. This could reflect focal acute pancreatitis. Recommend clinical correlation. There is a subtle low-density area within the inferior pancreatic head. Cannot exclude mass. Recommend outpatient MRI after acute symptoms resolve for further evaluation. Gallbladder is contracted with layering high-density material, likely small stones, similar to prior study. Moderate stool throughout the colon. Electronically Signed   By: Rolm Baptise M.D.   On: 02/29/2020 16:38   MR 3D Recon At Scanner  Result Date: 03/02/2020 CLINICAL DATA:  Acute abdominal pain. Nausea and vomiting. Possible pancreatic mass on recent CT EXAM: MRI ABDOMEN WITHOUT AND WITH CONTRAST (INCLUDING MRCP) TECHNIQUE: Multiplanar multisequence MR imaging of the abdomen was performed both before and after the administration of intravenous contrast. Heavily T2-weighted images of the biliary and pancreatic ducts were  obtained, and three-dimensional MRCP images were rendered by post processing. CONTRAST:  43m GADAVIST GADOBUTROL 1 MMOL/ML IV SOLN COMPARISON:  CT on 02/29/2020 FINDINGS: Lower chest: No acute findings. Hepatobiliary: No hepatic masses identified. Layering sludge is noted in the gallbladder, but there is no evidence of cholecystitis or biliary ductal dilatation. Pancreas: Swelling of the pancreatic head is seen with minimal adjacent peripancreatic edema/fluid, consistent with focal pancreatitis. No pancreatic mass identified. No evidence of pancreatic ductal dilatation. No evidence of pancreatic necrosis or peripancreatic pseudocysts. Spleen:  Within normal limits in size and appearance. Adrenals/Urinary Tract: No masses identified. No evidence of hydronephrosis. Stomach/Bowel: Visualized portion unremarkable. Vascular/Lymphatic: No pathologically enlarged lymph nodes identified. No abdominal aortic aneurysm. Other:  None. Musculoskeletal:  No suspicious bone lesions identified. IMPRESSION: 1. Focal acute pancreatitis involving the pancreatic head. No evidence of pancreatic necrosis, mass, or ductal dilatation. 2. Gallbladder sludge, without radiographic evidence of cholecystitis or biliary ductal dilatation. Electronically Signed   By: JMarlaine HindM.D.   On: 03/02/2020 08:06   DG CHEST PORT 1 VIEW  Result Date: 03/01/2020 CLINICAL DATA:  Dyspnea EXAM: PORTABLE CHEST 1 VIEW COMPARISON:  10/09/2019 FINDINGS: Cardiomediastinal contours accentuated by portable technique potentially mildly enlarged. Hilar structures are normal. Lungs are clear. No acute bone findings. Areas of subtle opacity project over left cardiac border and to the right of cardiac border likely external to the patient. IMPRESSION: No acute cardiopulmonary disease.  Mild cardiomegaly. Electronically Signed   By: GZetta BillsM.D.   On: 03/01/2020 10:23   DG Abd Portable 1V  Result Date: 03/07/2020 CLINICAL DATA:  Nausea and vomiting.  EXAM: PORTABLE ABDOMEN - 1 VIEW COMPARISON:  Abdominal x-ray dated March 03, 2020. FINDINGS: The bowel gas pattern is normal. Unchanged moderate colonic stool burden. No radio-opaque calculi or other significant radiographic abnormality are seen. Prior cholecystectomy. No acute osseous abnormality. IMPRESSION: No acute findings. Electronically Signed   By: WTitus DubinM.D.   On: 03/07/2020 10:30   DG Abd Portable 1V  Result Date: 03/03/2020 CLINICAL DATA:  Abdominal pain EXAM: PORTABLE ABDOMEN - 1 VIEW COMPARISON:  CT 02/29/2020 FINDINGS: Moderate stool burden. Nonobstructive bowel gas pattern. No organomegaly, suspicious calcification or free air. IMPRESSION: Moderate stool burden.  No acute findings. Electronically Signed   By: KRolm BaptiseM.D.   On: 03/03/2020 08:45   MR ABDOMEN MRCP W WO CONTAST  Result Date: 03/02/2020 CLINICAL DATA:  Acute abdominal pain. Nausea and vomiting. Possible pancreatic mass on recent CT EXAM: MRI ABDOMEN WITHOUT AND WITH CONTRAST (INCLUDING MRCP) TECHNIQUE: Multiplanar multisequence MR imaging of the abdomen was performed both before and after the administration of intravenous contrast. Heavily T2-weighted images of the biliary and pancreatic ducts were obtained, and three-dimensional MRCP images were rendered by post processing. CONTRAST:  8m GADAVIST GADOBUTROL 1 MMOL/ML IV SOLN COMPARISON:  CT on 02/29/2020 FINDINGS: Lower chest: No acute findings. Hepatobiliary: No hepatic masses identified. Layering sludge is noted in the gallbladder, but there is no evidence of cholecystitis or biliary ductal dilatation. Pancreas: Swelling of the pancreatic head is seen with minimal adjacent peripancreatic edema/fluid, consistent with focal pancreatitis. No pancreatic mass identified. No evidence of pancreatic ductal dilatation. No evidence of pancreatic necrosis or peripancreatic pseudocysts. Spleen:  Within normal limits in size and appearance. Adrenals/Urinary Tract: No masses  identified. No evidence of hydronephrosis. Stomach/Bowel: Visualized portion unremarkable. Vascular/Lymphatic: No pathologically enlarged lymph nodes identified. No abdominal aortic aneurysm. Other:  None. Musculoskeletal:  No suspicious bone lesions identified. IMPRESSION: 1. Focal acute pancreatitis involving the pancreatic head. No evidence of pancreatic necrosis, mass, or ductal dilatation. 2. Gallbladder sludge, without radiographic evidence of cholecystitis or biliary ductal dilatation. Electronically Signed   By: JMarlaine HindM.D.   On: 03/02/2020 08:06   [place]KG SITE RITE  Result Date: 03/01/2020 If Site Rite image not attached, placement could not be confirmed due to current cardiac rhythm.  [place]oreaAbdomen Limited RUQ  Result Date: 03/04/2020 CLINICAL DATA:  Gallstone pancreatitis EXAM: ULTRASOUND ABDOMEN LIMITED RIGHT UPPER QUADRANT COMPARISON:  CT 02/29/2020 FINDINGS: Gallbladder: Multiple small stones/gravel layering in the gallbladder. No wall thickening or sonographic Murphy sign. Common bile duct: Diameter: Normal caliber, 4 mm Liver: No focal lesion identified. Within normal limits in parenchymal echogenicity. Portal vein is patent on color Doppler imaging with normal direction of blood flow towards the liver. Other: None. IMPRESSION: Numerous small layering gallstones. No evidence of acute cholecystitis. Electronically Signed   By: KRolm BaptiseM.D.   On: 03/04/2020 22:49    Microbiology: Recent Results (from the past 240 hour(s))  Respiratory Panel by RT PCR (Flu A&B, Covid) - Nasopharyngeal Swab     Status: None   Collection Time: 03/03/20 11:15 AM   Specimen: Nasopharyngeal Swab  Result Value Ref Range Status   SARS Coronavirus 2 by RT PCR NEGATIVE NEGATIVE Final    Comment: (NOTE) SARS-CoV-2 target nucleic acids are NOT DETECTED. The SARS-CoV-2 RNA is generally detectable in upper respiratoy specimens during the acute phase of infection. The lowest concentration of SARS-CoV-2  viral copies this assay can detect is 131 copies/mL. A negative result does not preclude SARS-Cov-2 infection and should not be used as the sole basis for treatment or other patient management decisions. A negative result may occur with  improper specimen collection/handling, submission of specimen other than nasopharyngeal swab, presence of viral mutation(s) within the areas targeted by this assay, and inadequate number of viral copies (<131 copies/mL). A negative result must be combined with clinical observations, patient history, and epidemiological information. The expected result is Negative. Fact Sheet for Patients:  hPinkCheek.beFact Sheet for Healthcare Providers:  hGravelBags.itThis test is not yet ap proved or cleared by the [place] and  has been authorized for detection and/or diagnosis of SARS-CoV-2 by FDA under an Emergency Use Authorization (EUA). This EUA will remain  in effect (meaning this test can be used)  for the duration of the COVID-19 declaration under Section 564(b)(1) of the Act, 21 U.S.C. section 360bbb-3(b)(1), unless the authorization is terminated or revoked sooner.    Influenza A by PCR NEGATIVE NEGATIVE Final   Influenza B by PCR NEGATIVE NEGATIVE Final    Comment: (NOTE) The Xpert Xpress SARS-CoV-2/FLU/RSV assay is intended as an aid in  the diagnosis of influenza from Nasopharyngeal swab specimens and  should not be used as a sole basis for treatment. Nasal washings and  aspirates are unacceptable for Xpert Xpress SARS-CoV-2/FLU/RSV  testing. Fact Sheet for Patients: PinkCheek.be Fact Sheet for Healthcare Providers: GravelBags.it This test is not yet approved or cleared by the [place] FDA and  has been authorized for detection and/or diagnosis of SARS-CoV-2 by  FDA under an Emergency Use Authorization (EUA). This EUA will  remain  in effect (meaning this test can be used) for the duration of the  Covid-19 declaration under Section 564(b)(1) of the Act, 21  U.S.C. section 360bbb-3(b)(1), unless the authorization is  terminated or revoked. Performed at [facility], [place] [address], [place], [place] [address]   MRSA PCR Screening     Status: None   Collection Time: 03/05/20  8:40 AM   Specimen: Nasopharyngeal  Result Value Ref Range Status   MRSA by PCR NEGATIVE NEGATIVE Final    Comment:        The GeneXpert MRSA Assay (FDA approved for NASAL specimens only), is one component of a comprehensive MRSA colonization surveillance program. It is not intended to diagnose MRSA infection nor to guide or monitor treatment for MRSA infections. Performed at [facility], [place] [address] [place], [place] [address]   SARS CORONAVIRUS 2 (TAT 6-24 HRS) Nasopharyngeal Nasopharyngeal Swab     Status: None   Collection Time: 03/06/20 12:56 PM   Specimen: Nasopharyngeal Swab  Result Value Ref Range Status   SARS Coronavirus 2 NEGATIVE NEGATIVE Final    Comment: (NOTE) SARS-CoV-2 target nucleic acids are NOT DETECTED. The SARS-CoV-2 RNA is generally detectable in upper and lower respiratory specimens during the acute phase of infection. Negative results do not preclude SARS-CoV-2 infection, do not rule out co-infections with other pathogens, and should not be used as the sole basis for treatment or other patient management decisions. Negative results must be combined with clinical observations, patient history, and epidemiological information. The expected result is Negative. Fact Sheet for Patients: SugarRoll.be Fact Sheet for Healthcare Providers: https://www.woods-mathews.com/ This test is not yet approved or cleared by the [place] FDA and  has been authorized for detection and/or diagnosis of SARS-CoV-2 by FDA under an  Emergency Use Authorization (EUA). This EUA will remain  in effect (meaning this test can be used) for the duration of the COVID-19 declaration under Section 56 4(b)(1) of the Act, 21 U.S.C. section 360bbb-3(b)(1), unless the authorization is terminated or revoked sooner. Performed at [facility], [place] [address] [place], [place] [address]      Labs: Basic Metabolic Panel: Recent Labs  Lab 03/01/20 1010 03/01/20 1010 03/03/20 0341 03/04/20 0337 03/05/20 0850 03/06/20 0325 03/07/20 1253  NA 140  --  139  --  142 137 139  K 4.4  --  4.3  --  4.0 4.2 3.7  CL 107  --  107  --  108 105 101  CO2 23  --  25  --  '26 26 29  '$ GLUCOSE 178*  --  105*  --  112* 179* 181*  BUN 27*  --  36*  --  24* 21* 11  CREATININE 1.31*   < > 1.72* 1.09* 0.86 0.97 0.87  CALCIUM 8.9  --  8.4*  --  8.9 8.5* 9.0  MG  --   --  2.2  --  1.9  --   --   PHOS  --   --   --   --  2.3*  --   --    < > = values in this interval not displayed.   Liver Function Tests: Recent Labs  Lab 03/01/20 1010 03/03/20 0341 03/05/20 0850 03/06/20 0325 03/07/20 1253  AST 11* 11* 16 32 29  ALT '14 13 22 '$ 37 43  ALKPHOS 57 44 50 44 48  BILITOT 0.5 0.4 0.7 0.7 0.7  PROT 7.4 6.3* 6.3* 5.9* 6.5  ALBUMIN 3.4* 3.0* 3.1* 2.8* 3.2*   Recent Labs  Lab 03/01/20 1010 03/02/20 0422 03/03/20 0341  LIPASE 33 21 20   No results for input(s): AMMONIA in the last 168 hours. CBC: Recent Labs  Lab 03/01/20 1010 03/03/20 0341 03/05/20 0850 03/06/20 0325 03/07/20 1258  WBC 13.5* 10.9* 9.0 13.6* 14.0*  NEUTROABS 10.8* 4.9 4.3  --   --   HGB 11.5* 9.7* 10.5* 9.7* 10.1*  HCT 35.5* 31.4* 33.3* 30.0* 31.2*  MCV 89.4 93.5 90.0 90.1 87.2  PLT 254 222 231 249 228   Cardiac Enzymes: No results for input(s): CKTOTAL, CKMB, CKMBINDEX, TROPONINI in the last 168 hours. BNP: BNP (last 3 results) No results for input(s): BNP in the last 8760 hours.  ProBNP (last 3 results) No results for input(s): PROBNP in the last 8760  hours.  CBG: Recent Labs  Lab 03/06/20 1136 03/06/20 1638 03/06/20 2146 03/07/20 0721 03/07/20 1145  GLUCAP 174* 156* 147* 134* 173*       Signed:  Kayleen Memos, MD [facility] 03/07/2020, 3:07 PM

## 2020-03-07 NOTE — Progress Notes (Signed)
[facility] Progress Note  2 Days Post-Op  Subjective: Patient reports some soreness in RUQ. Still having issues with nausea and vomiting but unclear whether this is related to surgery vs chronic gastroparesis. No flatus or BM.     Objective: Vital signs in last 24 hours: Temp:  [98 F (36.7 C)-98.4 F (36.9 C)] 98.1 F (36.7 C) (03/22 0600) Pulse Rate:  [75-93] 79 (03/22 0827) Resp:  [16-18] 18 (03/22 0600) BP: (122-193)/(67-106) 178/86 (03/22 0827) SpO2:  [97 %-100 %] 100 % (03/22 0600) Last BM Date: 03/04/20  Intake/Output from previous day: 03/21 0701 - 03/22 0700 In: 2212.9 [P.O.:420; I.V.:1792.9] Out: -  Intake/Output this shift: No intake/output data recorded.  PE: General: pleasant, WD, WN female who is sitting up in NAD Heart: regular, rate, and rhythm.  Normal s1,s2. No obvious murmurs, gallops, or rubs noted.  Palpable radial and pedal pulses bilaterally Lungs: CTAB, no wheezes, rhonchi, or rales noted.  Respiratory effort nonlabored Abd: soft, NT, ND, +BS, no masses, hernias, or organomegaly Skin: warm and dry with no masses, lesions, or rashes Psych: A&Ox3 with a flat affect.   Lab Results:  Recent Labs    03/05/20 0850 03/06/20 0325  WBC 9.0 13.6*  HGB 10.5* 9.7*  HCT 33.3* 30.0*  PLT 231 249   BMET Recent Labs    03/05/20 0850 03/06/20 0325  NA 142 137  K 4.0 4.2  CL 108 105  CO2 26 26  GLUCOSE 112* 179*  BUN 24* 21*  CREATININE 0.86 0.97  CALCIUM 8.9 8.5*   PT/INR No results for input(s): LABPROT, INR in the last 72 hours. CMP     Component Value Date/Time   NA 137 03/06/2020 0325   NA 135 08/27/2019 1707   K 4.2 03/06/2020 0325   CL 105 03/06/2020 0325   CO2 26 03/06/2020 0325   GLUCOSE 179 (H) 03/06/2020 0325   BUN 21 (H) 03/06/2020 0325   BUN 22 08/27/2019 1707   CREATININE 0.97 03/06/2020 0325   CREATININE 0.73 08/17/2014 1641   CALCIUM 8.5 (L) 03/06/2020 0325   PROT 5.9 (L) 03/06/2020 0325   PROT 7.5  08/27/2019 1707   ALBUMIN 2.8 (L) 03/06/2020 0325   ALBUMIN 4.4 08/27/2019 1707   AST 32 03/06/2020 0325   ALT 37 03/06/2020 0325   ALKPHOS 44 03/06/2020 0325   BILITOT 0.7 03/06/2020 0325   BILITOT 0.2 08/27/2019 1707   GFRNONAA >60 03/06/2020 0325   GFRAA >60 03/06/2020 0325   Lipase     Component Value Date/Time   LIPASE 20 03/03/2020 0341       Studies/Results: DG Cholangiogram Operative  Result Date: 03/05/2020 CLINICAL DATA:  59 year old female undergoing laparoscopic cholecystectomy with intraoperative cholangiogram EXAM: INTRAOPERATIVE CHOLANGIOGRAM TECHNIQUE: Cholangiographic images from the C-arm fluoroscopic device were submitted for interpretation post-operatively. Please see the procedural report for the amount of contrast and the fluoroscopy time utilized. COMPARISON:  Abdominal ultrasound 03/04/2020 FINDINGS: A single cine clip is submitted for interpretation. There is attempted intraoperative cholangiogram with partial filling of the cystic duct. However, the majority of the injected contrast material extravasates into the peritoneal space. IMPRESSION: Unsuccessful intraoperative cholangiogram. Electronically Signed   By: Jacqulynn Cadet M.D.   On: 03/05/2020 12:27    Anti-infectives: Anti-infectives (From admission, onward)   Start     Dose/Rate Route Frequency Ordered Stop   03/05/20 1116  ceFAZolin (ANCEF) 2-4 GM/100ML-% IVPB    Note to Pharmacy: Darlys Gales   : cabinet override  03/05/20 1116 03/05/20 1145   03/05/20 1045  ceFAZolin (ANCEF) IVPB 2g/100 mL premix     2 g 200 mL/hr over 30 Minutes Intravenous On call to O.R. 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 [name] - POD#2 - incisions c/d/i - patient having complaints of nausea and vomiting - likely more  related to gastroparesis but will check labs and abdominal film to confirm - if LFTs and Tbili normal, ok to discharge from a surgical standpoint - if abdominal film shows constipation, consider more aggressive bowel regimen; if film shows gastroparesis recommend continuing chronic management for this  - follow up in chart   FEN: HH diet VTE: SCDs, lovenox ID: no current abx, WBC 10.9 and patient is afebrile  LOS: 7 days    Brigid Re , [facility] 03/07/2020, 9:01 AM Please see Amion for pager number during day hours 7:00am-4:30pm

## 2020-03-07 NOTE — Progress Notes (Signed)
Right arm DL PICC removed per protocol for pt's discharge. Manual pressure applied for 5 mins. No bleeding or swelling noted. Instructed patient to remain in bed until 1630. Educated patient about S/S of infection and when to call MD; no heavy lifting or pressure on right side for 24 hours; keep dressing dry and intact until 1600 tomorrow. Pt verbalized comprehension.

## 2020-03-07 NOTE — Progress Notes (Signed)
PT Cancellation Note  Patient Details Name: Nancy [name] MRN: [id number] DOB: 1961-03-16   Cancelled Treatment:    Reason Eval/Treat Not Completed: Other (comment);Medical issues which prohibited therapy(Secondary to removal of PICC line, She is on bedrest until 4:30 awaiting transport to SNF for [facility]) Rollen Sox, PT # [phone] CGV cell  Casandra Doffing 03/07/2020, 4:31 PM

## 2020-03-07 NOTE — TOC Transition Note (Addendum)
Transition of Care [facility]) - CM/SW Discharge Note   Patient Details  Name: Nancy [name] MRN: [id number] Date of Birth: July 27, 1961  [facility]) CM/SW Contact:  Lia Hopping, LCSW Phone Number: 03/07/2020, 2:24 PM   Clinical Narrative:    [facility] has received authorization for rehab placement . PASRR number provided  Nurse/physician notified. Spouse notified of discharge.  PTAR to transport.   Nurse call report: [facility], [phone]   Final next level of care: Skilled Nursing Facility Barriers to Discharge: Barriers Resolved   Patient Goals and CMS Choice   CMS Medicare.gov Compare Post Acute Care list provided to:: Patient Choice offered to / list presented to : Patient  Discharge Placement PASRR number recieved: 03/04/20            Patient chooses bed at: [facility] Patient to be transferred to facility by: [facility] Name of family member notified: Patient/Physician notified spouse of transport to SNF Patient and family notified of of transfer: 03/07/20  Discharge Plan and Services In-house Referral: Clinical Social Work   Post Acute Care Choice: [facility], [facility]                               Social Determinants of Health (SDOH) Interventions     Readmission Risk Interventions No flowsheet data found.

## 2020-03-07 NOTE — Progress Notes (Addendum)
Updated the patient's spouse Mr. Fyfe.  All questions answered.  Recurrent intermittent nausea with no vomiting. Recommend gastric emptying study in the outpatient setting due to concern for gastroparesis.    We'll start Reglan since conservative management not helpful.

## 2020-03-07 NOTE — Progress Notes (Signed)
Nurse at [facility] was given report on patient.

## 2020-03-07 NOTE — Progress Notes (Signed)
Attempted report to [facility] receiving RN.

## 2020-03-08 LAB — SURGICAL PATHOLOGY

## 2020-03-23 ENCOUNTER — Encounter: Payer: Self-pay | Admitting: Internal Medicine

## 2020-03-28 ENCOUNTER — Encounter: Payer: 59 | Admitting: Neurology

## 2020-04-20 ENCOUNTER — Emergency Department (HOSPITAL_COMMUNITY): Payer: 59

## 2020-04-20 ENCOUNTER — Other Ambulatory Visit: Payer: Self-pay

## 2020-04-20 ENCOUNTER — Encounter (HOSPITAL_COMMUNITY): Payer: Self-pay

## 2020-04-20 ENCOUNTER — Observation Stay (HOSPITAL_COMMUNITY): Payer: 59

## 2020-04-20 ENCOUNTER — Inpatient Hospital Stay (HOSPITAL_COMMUNITY)
Admission: EM | Admit: 2020-04-20 | Discharge: 2020-04-22 | DRG: 683 | Disposition: A | Discharge status: Home Health | Payer: 59 | Attending: Internal Medicine | Admitting: Internal Medicine

## 2020-04-20 DIAGNOSIS — E861 Hypovolemia: Secondary | ICD-10-CM | POA: Diagnosis present

## 2020-04-20 DIAGNOSIS — IMO0002 Reserved for concepts with insufficient information to code with codable children: Secondary | ICD-10-CM | POA: Diagnosis present

## 2020-04-20 DIAGNOSIS — E785 Hyperlipidemia, unspecified: Secondary | ICD-10-CM | POA: Diagnosis present

## 2020-04-20 DIAGNOSIS — E669 Obesity, unspecified: Secondary | ICD-10-CM | POA: Diagnosis present

## 2020-04-20 DIAGNOSIS — E1140 Type 2 diabetes mellitus with diabetic neuropathy, unspecified: Secondary | ICD-10-CM

## 2020-04-20 DIAGNOSIS — E1143 Type 2 diabetes mellitus with diabetic autonomic (poly)neuropathy: Secondary | ICD-10-CM | POA: Diagnosis not present

## 2020-04-20 DIAGNOSIS — E119 Type 2 diabetes mellitus without complications: Secondary | ICD-10-CM

## 2020-04-20 DIAGNOSIS — I160 Hypertensive urgency: Secondary | ICD-10-CM | POA: Diagnosis present

## 2020-04-20 DIAGNOSIS — E1122 Type 2 diabetes mellitus with diabetic chronic kidney disease: Secondary | ICD-10-CM

## 2020-04-20 DIAGNOSIS — N184 Chronic kidney disease, stage 4 (severe): Secondary | ICD-10-CM

## 2020-04-20 DIAGNOSIS — I639 Cerebral infarction, unspecified: Secondary | ICD-10-CM | POA: Diagnosis present

## 2020-04-20 DIAGNOSIS — K589 Irritable bowel syndrome without diarrhea: Secondary | ICD-10-CM | POA: Diagnosis present

## 2020-04-20 DIAGNOSIS — Z833 Family history of diabetes mellitus: Secondary | ICD-10-CM

## 2020-04-20 DIAGNOSIS — Z803 Family history of malignant neoplasm of breast: Secondary | ICD-10-CM

## 2020-04-20 DIAGNOSIS — E860 Dehydration: Secondary | ICD-10-CM | POA: Diagnosis present

## 2020-04-20 DIAGNOSIS — Z888 Allergy status to other drugs, medicaments and biological substances status: Secondary | ICD-10-CM

## 2020-04-20 DIAGNOSIS — I69354 Hemiplegia and hemiparesis following cerebral infarction affecting left non-dominant side: Secondary | ICD-10-CM

## 2020-04-20 DIAGNOSIS — Z794 Long term (current) use of insulin: Secondary | ICD-10-CM

## 2020-04-20 DIAGNOSIS — Z800 Family history of malignant neoplasm of digestive organs: Secondary | ICD-10-CM

## 2020-04-20 DIAGNOSIS — R5383 Other fatigue: Secondary | ICD-10-CM | POA: Diagnosis not present

## 2020-04-20 DIAGNOSIS — N179 Acute kidney failure, unspecified: Secondary | ICD-10-CM | POA: Diagnosis not present

## 2020-04-20 DIAGNOSIS — R112 Nausea with vomiting, unspecified: Secondary | ICD-10-CM

## 2020-04-20 DIAGNOSIS — N178 Other acute kidney failure: Secondary | ICD-10-CM

## 2020-04-20 DIAGNOSIS — Z6835 Body mass index (BMI) 35.0-35.9, adult: Secondary | ICD-10-CM

## 2020-04-20 DIAGNOSIS — E1165 Type 2 diabetes mellitus with hyperglycemia: Secondary | ICD-10-CM

## 2020-04-20 DIAGNOSIS — I10 Essential (primary) hypertension: Secondary | ICD-10-CM | POA: Diagnosis present

## 2020-04-20 DIAGNOSIS — H25013 Cortical age-related cataract, bilateral: Secondary | ICD-10-CM | POA: Diagnosis present

## 2020-04-20 DIAGNOSIS — Z885 Allergy status to narcotic agent status: Secondary | ICD-10-CM

## 2020-04-20 DIAGNOSIS — Z8249 Family history of ischemic heart disease and other diseases of the circulatory system: Secondary | ICD-10-CM

## 2020-04-20 DIAGNOSIS — Z20822 Contact with and (suspected) exposure to covid-19: Secondary | ICD-10-CM | POA: Diagnosis present

## 2020-04-20 DIAGNOSIS — K3184 Gastroparesis: Secondary | ICD-10-CM | POA: Diagnosis present

## 2020-04-20 DIAGNOSIS — K219 Gastro-esophageal reflux disease without esophagitis: Secondary | ICD-10-CM | POA: Diagnosis present

## 2020-04-20 DIAGNOSIS — Z87891 Personal history of nicotine dependence: Secondary | ICD-10-CM

## 2020-04-20 DIAGNOSIS — Z882 Allergy status to sulfonamides status: Secondary | ICD-10-CM

## 2020-04-20 DIAGNOSIS — E1169 Type 2 diabetes mellitus with other specified complication: Secondary | ICD-10-CM | POA: Diagnosis present

## 2020-04-20 DIAGNOSIS — H9190 Unspecified hearing loss, unspecified ear: Secondary | ICD-10-CM | POA: Diagnosis present

## 2020-04-20 DIAGNOSIS — H3530 Unspecified macular degeneration: Secondary | ICD-10-CM | POA: Diagnosis present

## 2020-04-20 DIAGNOSIS — Z83438 Family history of other disorder of lipoprotein metabolism and other lipidemia: Secondary | ICD-10-CM

## 2020-04-20 DIAGNOSIS — R531 Weakness: Secondary | ICD-10-CM

## 2020-04-20 DIAGNOSIS — E1149 Type 2 diabetes mellitus with other diabetic neurological complication: Secondary | ICD-10-CM | POA: Diagnosis present

## 2020-04-20 DIAGNOSIS — Z7982 Long term (current) use of aspirin: Secondary | ICD-10-CM

## 2020-04-20 DIAGNOSIS — Z8349 Family history of other endocrine, nutritional and metabolic diseases: Secondary | ICD-10-CM

## 2020-04-20 LAB — CBC WITH DIFFERENTIAL/PLATELET
Abs Immature Granulocytes: 0.03 10*3/uL (ref 0.00–0.07)
Basophils Absolute: 0.1 10*3/uL (ref 0.0–0.1)
Basophils Relative: 1 %
Eosinophils Absolute: 0.1 10*3/uL (ref 0.0–0.5)
Eosinophils Relative: 1 %
HCT: 39.1 % (ref 36.0–46.0)
Hemoglobin: 12.5 g/dL (ref 12.0–15.0)
Immature Granulocytes: 0 %
Lymphocytes Relative: 21 %
Lymphs Abs: 2.3 10*3/uL (ref 0.7–4.0)
MCH: 28.9 pg (ref 26.0–34.0)
MCHC: 32 g/dL (ref 30.0–36.0)
MCV: 90.5 fL (ref 80.0–100.0)
Monocytes Absolute: 0.6 10*3/uL (ref 0.1–1.0)
Monocytes Relative: 6 %
Neutro Abs: 7.7 10*3/uL (ref 1.7–7.7)
Neutrophils Relative %: 71 %
Platelets: 313 10*3/uL (ref 150–400)
RBC: 4.32 MIL/uL (ref 3.87–5.11)
RDW: 12.6 % (ref 11.5–15.5)
WBC: 10.8 10*3/uL — ABNORMAL HIGH (ref 4.0–10.5)
nRBC: 0 % (ref 0.0–0.2)

## 2020-04-20 LAB — URINALYSIS, ROUTINE W REFLEX MICROSCOPIC
Bilirubin Urine: NEGATIVE
Glucose, UA: NEGATIVE mg/dL
Hgb urine dipstick: NEGATIVE
Ketones, ur: NEGATIVE mg/dL
Leukocytes,Ua: NEGATIVE
Nitrite: NEGATIVE
Protein, ur: NEGATIVE mg/dL
Specific Gravity, Urine: 1.009 (ref 1.005–1.030)
pH: 5 (ref 5.0–8.0)

## 2020-04-20 LAB — COMPREHENSIVE METABOLIC PANEL
ALT: 15 U/L (ref 0–44)
AST: 17 U/L (ref 15–41)
Albumin: 4 g/dL (ref 3.5–5.0)
Alkaline Phosphatase: 61 U/L (ref 38–126)
Anion gap: 13 (ref 5–15)
BUN: 22 mg/dL — ABNORMAL HIGH (ref 6–20)
CO2: 26 mmol/L (ref 22–32)
Calcium: 9.3 mg/dL (ref 8.9–10.3)
Chloride: 97 mmol/L — ABNORMAL LOW (ref 98–111)
Creatinine, Ser: 2.38 mg/dL — ABNORMAL HIGH (ref 0.44–1.00)
GFR calc Af Amer: 25 mL/min — ABNORMAL LOW (ref >60)
GFR calc non Af Amer: 21 mL/min — ABNORMAL LOW (ref >60)
Glucose, Bld: 162 mg/dL — ABNORMAL HIGH (ref 70–99)
Potassium: 4.8 mmol/L (ref 3.5–5.1)
Sodium: 136 mmol/L (ref 135–145)
Total Bilirubin: 0.9 mg/dL (ref 0.3–1.2)
Total Protein: 8 g/dL (ref 6.5–8.1)

## 2020-04-20 LAB — SODIUM, URINE, RANDOM: Sodium, Ur: 31 mmol/L

## 2020-04-20 LAB — TSH: TSH: 1.42 u[IU]/mL (ref 0.350–4.500)

## 2020-04-20 LAB — LIPASE, BLOOD: Lipase: 17 U/L (ref 11–51)

## 2020-04-20 LAB — CREATININE, URINE, RANDOM: Creatinine, Urine: 119.11 mg/dL

## 2020-04-20 LAB — GLUCOSE, CAPILLARY: Glucose-Capillary: 177 mg/dL — ABNORMAL HIGH (ref 70–99)

## 2020-04-20 LAB — TROPONIN I (HIGH SENSITIVITY)
Troponin I (High Sensitivity): 3 ng/L (ref <18)
Troponin I (High Sensitivity): 4 ng/L (ref <18)

## 2020-04-20 LAB — CK: Total CK: 68 U/L (ref 38–234)

## 2020-04-20 LAB — MAGNESIUM: Magnesium: 1.9 mg/dL (ref 1.7–2.4)

## 2020-04-20 LAB — LACTIC ACID, PLASMA: Lactic Acid, Venous: 1.6 mmol/L (ref 0.5–1.9)

## 2020-04-20 MED ORDER — SODIUM CHLORIDE 0.9 % IV SOLN: INTRAVENOUS | Status: DC

## 2020-04-20 MED ORDER — EZETIMIBE 10 MG PO TABS
10.0000 mg | ORAL_TABLET | Freq: Every day | ORAL | Status: DC
  Administered 2020-04-21 – 2020-04-22 (×2): 10 mg via ORAL
  Filled 2020-04-20 (×2): qty 1

## 2020-04-20 MED ORDER — INSULIN ASPART 100 UNIT/ML ~~LOC~~ SOLN
0.0000 [IU] | Freq: Three times a day (TID) | SUBCUTANEOUS | Status: DC
  Administered 2020-04-21 (×2): 2 [IU] via SUBCUTANEOUS
  Administered 2020-04-21 – 2020-04-22 (×2): 1 [IU] via SUBCUTANEOUS
  Filled 2020-04-20: qty 0.09

## 2020-04-20 MED ORDER — SORBITOL 70 % SOLN
30.0000 mL | Freq: Every day | Status: DC | PRN
  Filled 2020-04-20: qty 30

## 2020-04-20 MED ORDER — ATORVASTATIN CALCIUM 20 MG PO TABS
20.0000 mg | ORAL_TABLET | Freq: Every day | ORAL | Status: DC
  Administered 2020-04-21 – 2020-04-22 (×2): 20 mg via ORAL
  Filled 2020-04-20 (×2): qty 1

## 2020-04-20 MED ORDER — ACETAMINOPHEN 650 MG RE SUPP: 650.0000 mg | Freq: Four times a day (QID) | RECTAL | Status: DC | PRN

## 2020-04-20 MED ORDER — INSULIN ASPART 100 UNIT/ML ~~LOC~~ SOLN
0.0000 [IU] | Freq: Every day | SUBCUTANEOUS | Status: DC
  Filled 2020-04-20: qty 0.05

## 2020-04-20 MED ORDER — METOPROLOL TARTRATE 5 MG/5ML IV SOLN: 5.0000 mg | Freq: Four times a day (QID) | INTRAVENOUS | Status: DC | PRN

## 2020-04-20 MED ORDER — RISPERIDONE 1 MG PO TABS
2.0000 mg | ORAL_TABLET | Freq: Every day | ORAL | Status: DC
  Administered 2020-04-21 (×2): 2 mg via ORAL
  Filled 2020-04-20 (×2): qty 2

## 2020-04-20 MED ORDER — ONDANSETRON HCL 4 MG/2ML IJ SOLN
4.0000 mg | Freq: Once | INTRAMUSCULAR | Status: AC
  Administered 2020-04-20: 4 mg via INTRAVENOUS
  Filled 2020-04-20: qty 2

## 2020-04-20 MED ORDER — ONDANSETRON HCL 4 MG PO TABS: 4.0000 mg | ORAL_TABLET | Freq: Four times a day (QID) | ORAL | Status: DC | PRN

## 2020-04-20 MED ORDER — ASPIRIN EC 81 MG PO TBEC
81.0000 mg | DELAYED_RELEASE_TABLET | Freq: Every day | ORAL | Status: DC
  Administered 2020-04-21 – 2020-04-22 (×2): 81 mg via ORAL
  Filled 2020-04-20 (×2): qty 1

## 2020-04-20 MED ORDER — INSULIN GLARGINE 100 UNIT/ML ~~LOC~~ SOLN
5.0000 [IU] | Freq: Every day | SUBCUTANEOUS | Status: DC
  Administered 2020-04-20 – 2020-04-21 (×2): 5 [IU] via SUBCUTANEOUS
  Filled 2020-04-20 (×2): qty 0.05

## 2020-04-20 MED ORDER — GABAPENTIN 300 MG PO CAPS
300.0000 mg | ORAL_CAPSULE | Freq: Every day | ORAL | Status: DC
  Administered 2020-04-21 (×2): 300 mg via ORAL
  Filled 2020-04-20 (×2): qty 1

## 2020-04-20 MED ORDER — ONDANSETRON HCL 4 MG/2ML IJ SOLN: 4.0000 mg | Freq: Four times a day (QID) | INTRAMUSCULAR | Status: DC | PRN

## 2020-04-20 MED ORDER — ACETAMINOPHEN 325 MG PO TABS
650.0000 mg | ORAL_TABLET | Freq: Once | ORAL | Status: AC
  Administered 2020-04-20: 650 mg via ORAL
  Filled 2020-04-20: qty 2

## 2020-04-20 MED ORDER — SODIUM CHLORIDE 0.9 % IV BOLUS
1000.0000 mL | Freq: Once | INTRAVENOUS | Status: AC
  Administered 2020-04-20: 1000 mL via INTRAVENOUS

## 2020-04-20 MED ORDER — AMLODIPINE BESYLATE 10 MG PO TABS
10.0000 mg | ORAL_TABLET | Freq: Every day | ORAL | Status: DC
  Administered 2020-04-21 – 2020-04-22 (×2): 10 mg via ORAL
  Filled 2020-04-20 (×2): qty 1

## 2020-04-20 MED ORDER — METOCLOPRAMIDE HCL 5 MG/ML IJ SOLN
5.0000 mg | Freq: Four times a day (QID) | INTRAMUSCULAR | Status: DC
  Administered 2020-04-21 – 2020-04-22 (×6): 5 mg via INTRAVENOUS
  Filled 2020-04-20 (×6): qty 2

## 2020-04-20 MED ORDER — METOPROLOL TARTRATE 25 MG PO TABS
25.0000 mg | ORAL_TABLET | Freq: Two times a day (BID) | ORAL | Status: DC
  Administered 2020-04-21 – 2020-04-22 (×4): 25 mg via ORAL
  Filled 2020-04-20 (×4): qty 1

## 2020-04-20 MED ORDER — SENNOSIDES-DOCUSATE SODIUM 8.6-50 MG PO TABS: 1.0000 | ORAL_TABLET | Freq: Every day | ORAL | Status: DC | PRN

## 2020-04-20 MED ORDER — SODIUM CHLORIDE 0.9% FLUSH: 3.0000 mL | Freq: Two times a day (BID) | INTRAVENOUS | Status: DC

## 2020-04-20 MED ORDER — HEPARIN SODIUM (PORCINE) 5000 UNIT/ML IJ SOLN
5000.0000 [IU] | Freq: Three times a day (TID) | INTRAMUSCULAR | Status: DC
  Administered 2020-04-21 – 2020-04-22 (×5): 5000 [IU] via SUBCUTANEOUS
  Filled 2020-04-20 (×5): qty 1

## 2020-04-20 MED ORDER — DULOXETINE HCL 20 MG PO CPEP
20.0000 mg | ORAL_CAPSULE | Freq: Every day | ORAL | Status: DC
  Administered 2020-04-21 – 2020-04-22 (×2): 20 mg via ORAL
  Filled 2020-04-20 (×2): qty 1

## 2020-04-20 MED ORDER — PANTOPRAZOLE SODIUM 40 MG PO TBEC
40.0000 mg | DELAYED_RELEASE_TABLET | Freq: Every day | ORAL | Status: DC
  Administered 2020-04-21 – 2020-04-22 (×2): 40 mg via ORAL
  Filled 2020-04-20 (×2): qty 1

## 2020-04-20 MED ORDER — ACETAMINOPHEN 325 MG PO TABS
650.0000 mg | ORAL_TABLET | Freq: Four times a day (QID) | ORAL | Status: DC | PRN
  Administered 2020-04-21 – 2020-04-22 (×3): 650 mg via ORAL
  Filled 2020-04-20 (×3): qty 2

## 2020-04-20 NOTE — ED Triage Notes (Signed)
Arrived by [facility] from home. Patient reports generalized weakness and fatigue over last few weeks. Patient states she hasn't really had the energy to get up to do anything. Patient has appointment with PCP on Friday to address this issue. Patient reports nausea and vomiting since around noon today and that her husband had to help her get to the bathroom.

## 2020-04-20 NOTE — H&P (Signed)
History and Physical    AMBUR PROVINCE GBM:[id number] DOB: 09-04-1961 DOA: 04/20/2020  PCP: [name], MD  Patient coming from: Home  I have personally briefly reviewed patient's old medical records in [place]  Chief Complaint: Generalized weakness/nausea vomiting/recent diarrhea  HPI: Nancy [name] is a 59 y.o. 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  Patient stated had 2 falls about a week ago.  Patient denies any melena, no hematemesis, no hematochezia, no syncope, no chest pain, no fever, no chills, no abdominal pain.  Patient does endorse some chronic shortness of breath that has been ongoing for several months.  ED Course: Patient seen in the ED, comprehensive metabolic profile with a BUN of 22, creatinine of 2.38 otherwise was within normal limits.  Lipase of 17.  Magnesium of 1.9.  Total CK of 68, high-sensitivity troponin of 3 and 4, lactic acid level of 1.6.  CBC with a white count of 10.8 otherwise was within normal limits.  TSH within normal limits at 1.420.  Urinalysis cloudy, negative ketones, negative leukocytes, negative nitrites, specific gravity 1.009.  Patient given IV fluids.  Hospitalist were called to admit the patient for further evaluation and management.  Review of Systems: As per HPI otherwise all other systems  reviewed and are negative.  Past Medical History:  Diagnosis Date  . Anemia 2006  . Depression 2014   previously on amitryptiline   . Diabetes mellitus with neurological manifestation ([place]) 2006  . Fracture of left ankle 1997   . Gastroparesis 07/2016  . HOH (hard of hearing) 2004   . Hyperlipidemia 2006  . Hypertension 2006  . IBS (irritable bowel syndrome) 2002   . Leukopenia 2015   . Macular degeneration 11/2019  . Shingles 2009   . Stroke ([place]) 2020  . Thyroid nodule 2004    Past Surgical History:  Procedure Laterality Date  . ABDOMINAL HYSTERECTOMY  2005  . CATARACT EXTRACTION Left 11/2019  . CESAREAN SECTION  1983   . CHOLECYSTECTOMY N/A 03/05/2020   Procedure: LAPAROSCOPIC CHOLECYSTECTOMY WITH INTRAOPERATIVE CHOLANGIOGRAM;  Surgeon: Donnie Mesa, MD;  Location: [facility];  Service: General;  Laterality: N/A;  . ESOPHAGOGASTRODUODENOSCOPY (EGD) WITH PROPOFOL Left 08/26/2014   Procedure: ESOPHAGOGASTRODUODENOSCOPY (EGD) WITH PROPOFOL;  Surgeon: Arta Silence, MD;  Location: [facility];  Service: Endoscopy;  Laterality: Left;  . ESOPHAGOGASTRODUODENOSCOPY (EGD) WITH PROPOFOL N/A 03/03/2020   Procedure: ESOPHAGOGASTRODUODENOSCOPY (EGD) WITH PROPOFOL;  Surgeon: Jerene Bears, MD;  Location: [facility];  Service: Gastroenterology;  Laterality: N/A;    Social History  reports that she has quit smoking. Her smoking use included cigarettes. She has a 0.13 pack-year smoking history. She has never used smokeless tobacco. She reports that she does not drink alcohol or use drugs.  Allergies  Allergen Reactions  . Hydralazine Hcl     Hair loss  . Hydrocodone Other (See Comments)    Upset stomach, itching  . Metformin And Related Nausea  And Vomiting and Other (See Comments)    Stomach pains   . Plaquenil [Hydroxychloroquine Sulfate] Hives  . Sulfur Hives    Family History  Problem Relation Age of Onset  . Hypertension Mother   . Heart disease Mother   . Diabetes Mother     . Thyroid disease Mother   . Congestive Heart Failure Mother   . Breast cancer Maternal Grandmother   . Colon cancer Maternal Grandfather   . Heart attack Sister   . Heart disease Brother   . Hyperlipidemia Brother   . Hypertension Brother   . Diabetes Father   . Breast cancer Maternal Aunt    Mother deceased age 89 with an enlarged heart per patient.  Father unknown.  Prior to Admission medications   Medication Sig Start Date End Date Taking? Authorizing Provider  acetaminophen (TYLENOL) 500 MG tablet Take 500 mg by mouth every 6 (six) hours as needed for moderate pain or headache.   Yes [provider]  acetaminophen (TYLENOL) 500 MG tablet Take 2 tablets (1,000 mg total) by mouth every 8 (eight) hours as needed for mild pain or moderate pain. Patient not taking: Reported on 04/20/2020 02/03/18   Alfonse Spruce, FNP  amLODipine (NORVASC) 10 MG tablet Take 1 tablet (10 mg total) by mouth daily. 03/08/20 05/07/20  Kayleen Memos, DO  aspirin EC 81 MG tablet Take 1 tablet (81 mg total) by mouth daily. 11/05/19   [name], MD  atorvastatin (LIPITOR) 20 MG tablet Take 1 tablet (20 mg total) by mouth daily. 02/09/20   [name], MD  Blood Glucose Monitoring Suppl (TRUE METRIX METER) w/Device KIT Use as directed 11/08/19   [name], MD  DULoxetine (CYMBALTA) 20 MG capsule TAKE 1 CAPSULE (20 MG TOTAL) BY MOUTH DAILY. 02/12/20   [name], MD  ezetimibe (ZETIA) 10 MG tablet Take 10 mg by mouth daily. 02/12/20   [provider]  gabapentin (NEURONTIN) 300 MG capsule Take 1 capsule (300 mg total) by mouth at bedtime. 08/27/19   [name], MD  insulin aspart (NOVOLOG) 100 UNIT/ML FlexPen Inject 4 Units into the skin 3 (three) times daily with meals. 03/07/20   Kayleen Memos, DO  insulin glargine (LANTUS) 100 UNIT/ML Solostar Pen Inject 4 Units into the skin at bedtime. 03/07/20   Kayleen Memos, DO  Insulin Pen Needle (PEN NEEDLES) 32G X  4 MM MISC 1 each by Does not apply route 4 (four) times daily. 12/04/16   Funches, Adriana Mccallum, MD  metoCLOPramide (REGLAN) 5 MG tablet Take 1 tablet (5 mg total) by mouth 4 (four) times daily -  before meals and at bedtime for 10 days. 03/07/20 03/17/20  Kayleen Memos, DO  metoprolol tartrate (LOPRESSOR) 25 MG tablet Take 1 tablet (25 mg total) by mouth 2 (two) times daily. 03/07/20 05/06/20  Kayleen Memos, DO  mupirocin ointment (BACTROBAN) 2 % APPLY OINTMENT TOPICALLY TO AFFECTED AREA TWICE DAILY 04/06/20   [provider]  omeprazole (PRILOSEC) 20 MG capsule TAKE 1 CAPSULE (20 MG TOTAL) BY MOUTH DAILY. 06/24/19   [name], MD  ondansetron (ZOFRAN) 4 MG tablet Take 1 tablet (4 mg total) by mouth every 8 (eight) hours as needed for nausea or vomiting. 03/07/20   Kayleen Memos, DO  risperiDONE (RISPERDAL) 2 MG tablet Take 1 tablet (2 mg total) by mouth at bedtime. 02/28/20   [name], MD  senna-docusate (SENOKOT S) 8.6-50 MG  tablet Take 1 tablet by mouth daily as needed for moderate constipation. 11/08/19   [name], MD  traZODone (DESYREL) 50 MG tablet Take 50 mg by mouth at bedtime. 04/09/20   [provider]  Insulin NPH Isophane & Regular (RELION 70/30 [place]) Inject 35 Units into the skin 2 (two) times daily.  08/17/14  [provider]    Physical Exam: Vitals:   04/20/20 4268 04/20/20 2004 04/20/20 2005 04/20/20 2203  BP:   135/73 127/75  Pulse:  89  91  Resp:    18  Temp:      TempSrc:      SpO2: 99% 100%  100%    Constitutional: NAD, calm, comfortable Vitals:   04/20/20 1716 04/20/20 2004 04/20/20 2005 04/20/20 2203  BP:   135/73 127/75  Pulse:  89  91  Resp:    18  Temp:      TempSrc:      SpO2: 99% 100%  100%   Eyes: PERRL, lids and conjunctivae normal ENMT: Mucous membranes are dry. Posterior pharynx clear of any exudate or lesions.Normal dentition.  Neck: normal, supple, no masses, no thyromegaly Respiratory: clear to  auscultation bilaterally, no wheezing, no crackles. Normal respiratory effort. No accessory muscle use.  Cardiovascular: Regular rate and rhythm, no murmurs / rubs / gallops. No extremity edema. 2+ pedal pulses. No carotid bruits.  Abdomen: no tenderness, no masses palpated. No hepatosplenomegaly. Bowel sounds positive.  Musculoskeletal: no clubbing / cyanosis. No joint deformity upper and lower extremities. Good ROM, no contractures. Normal muscle tone.  Skin: no rashes, lesions, ulcers. No induration Neurologic: CN 2-12 grossly intact. Sensation intact, DTR normal. Strength 5/5 in all 4.  Psychiatric: Normal judgment and insight. Alert and oriented x 3. Normal mood.    Labs on Admission: I have personally reviewed following labs and imaging studies  CBC: Recent Labs  Lab 04/20/20 1651  WBC 10.8*  NEUTROABS 7.7  HGB 12.5  HCT 39.1  MCV 90.5  PLT 341    Basic Metabolic Panel: Recent Labs  Lab 04/20/20 1651  NA 136  K 4.8  CL 97*  CO2 26  GLUCOSE 162*  BUN 22*  CREATININE 2.38*  CALCIUM 9.3  MG 1.9    GFR: CrCl cannot be calculated (Unknown ideal weight.).  Liver Function Tests: Recent Labs  Lab 04/20/20 1651  AST 17  ALT 15  ALKPHOS 61  BILITOT 0.9  PROT 8.0  ALBUMIN 4.0    Urine analysis:    Component Value Date/Time   COLORURINE YELLOW 04/20/2020 2008   APPEARANCEUR CLOUDY (A) 04/20/2020 2008   LABSPEC 1.009 04/20/2020 2008   PHURINE 5.0 04/20/2020 2008   GLUCOSEU NEGATIVE 04/20/2020 2008   HGBUR NEGATIVE 04/20/2020 2008   BILIRUBINUR NEGATIVE 04/20/2020 2008   BILIRUBINUR negative 12/26/2017 [place] 04/20/2020 2008   PROTEINUR NEGATIVE 04/20/2020 2008   UROBILINOGEN 0.2 12/26/2017 1458   UROBILINOGEN 0.2 09/07/2014 0857   NITRITE NEGATIVE 04/20/2020 2008   LEUKOCYTESUR NEGATIVE 04/20/2020 2008    Radiological Exams on Admission: DG Chest 2 View  Result Date: 04/20/2020 CLINICAL DATA:  Generalized weakness, fatigue, nausea,  vomiting EXAM: CHEST - 2 VIEW COMPARISON:  03/01/2020 FINDINGS: Frontal and lateral views of the chest demonstrate a stable cardiac silhouette. No airspace disease, effusion, or pneumothorax. No acute bony abnormalities. IMPRESSION: 1. Stable chest, no acute process. Electronically Signed   By: Randa Ngo M.D.   On: 04/20/2020 17:25    EKG: Independently  reviewed.  Normal sinus rhythm left axis deviation, T wave inversion in lead aVL which is old.  Assessment/Plan Principal Problem:   ARF (acute renal failure) (HCC) Active Problems:   Type II diabetes mellitus with neurological manifestations, uncontrolled ([place])   Hyperlipidemia   Diabetic gastroparesis associated with type 2 diabetes mellitus ([place])   Hypertension   Uncontrolled type 2 diabetes mellitus with diabetic neuropathy, with long-term current use of insulin (HCC)   Gastroesophageal reflux disease without esophagitis   Cerebrovascular accident (CVA) ([place])   Hyperlipidemia associated with type 2 diabetes mellitus ([place])   Generalized weakness   Dehydration  (please populate well all problems here in Problem List. (For example, if patient is on BP meds at home and you resume or decide to hold them, it is a problem that needs to be her. Same for CAD, COPD, HLD and so on)  1 acute renal failure Likely secondary to prerenal azotemia secondary to GI losses, in the setting of ACE inhibitor and diuretics.  Patient presented with nausea vomiting, significant generalized weakness, decreased oral intake, 3-day history of diarrhea which has since resolved, lightheadedness and noted to have a creatinine on admission of 2.38.  Last noted creatinine of 0.87 on 03/07/2020. Check a urine sodium and urine creatinine.  Hydrate with IV fluids.  Follow urine output.  If no improvement with renal function will get a renal ultrasound, place a Foley catheter and consult with nephrology.  Follow for now.  2.  Nausea vomiting Questionable etiology.  May be  secondary to diabetic gastroparesis.  Patient noted on med rec is supposed to be on Reglan however noted not to be taking.  Patient denies any abdominal pain.  Lipase levels within normal limits.  LFTs within normal limits.  Patient asking for food.  Check an abdominal plain films.  Patient stated ate some solid food around lunchtime today with no further emesis and is willing to try solid food at this time and does not want clear liquids.  Will place on a carb modified diet.  Reglan 5 mg IV every 6 hours.  Supportive care.  3.  Dehydration IV fluids.  4.  Poorly controlled type 2 diabetes mellitus with gastroparesis and neuropathy Last hemoglobin A1c was 10.8 (02/29/2020).  Place on Lantus 5 units nightly as patient noted to be on Lantus 4 units at bedtime.  Hold meal coverage NovoLog for now until patient is eating greater than 50% of her meals.  Placed on sliding scale insulin.  Placed on Reglan 5 mg IV every 6 hours.  Resume home regimen of Neurontin.  Consult with diabetic coordinator.  Follow.  5.  Generalized weakness Likely secondary to significant dehydration in the setting of nausea vomiting and diarrhea.  IV fluids.  Supportive care.  PT/OT.  6.  History of other nonhemorrhagic CVA Continue aspirin for secondary stroke prophylaxis.  Resume home regimen statin, zeita.  PT/OT.  7.  Gastroesophageal reflux disease PPI.  8.  Hypertension Resume home regimen Norvasc.  Hold HCTZ and lisinopril secondary to acute renal failure.  DVT prophylaxis: Heparin Code Status: Full Family Communication:  Updated patient and husband at bedside. Disposition Plan:   Patient is from:  Home  Anticipated DC to:  Home  Anticipated DC date:  5/6-06/2020  Anticipated DC barriers: Acute renal failure/nausea and vomiting/generalized weakness.  Consults called: None Admission status:  Place in observation.  Severity of Illness: Patient will be place in observation.  Patient meets criteria for observation.   Patient will  be hydrated with IV fluids, renal function monitored for resolution of acute renal failure.  Patient will be placed on Reglan.  Will likely improve and not require over 2 midnight stay.      MD [facility]  How to contact the [facility] Attending or Consulting provider 7A - 7P or covering provider during after hours 7P -7A, for this patient?   1. Check the care team in [facility] and look for a) attending/consulting [facility] provider listed and b) the [facility] team listed 2. Log into [email or web address] and use [place]'s universal password to access. If you do not have the password, please contact the hospital operator. 3. Locate the [facility] provider you are looking for under [facility] and page to a number that you can be directly reached. 4. If you still have difficulty reaching the provider, please page the DOC (Director on Call) for the Hospitalists listed on amion for assistance.  04/20/2020, 11:04 PM   

## 2020-04-20 NOTE — ED Notes (Signed)
Husband at bedside.  

## 2020-04-20 NOTE — ED Provider Notes (Signed)
Nancy [name] Provider Note   CSN: [id number] Arrival date & time: 04/20/20  1636     History No chief complaint on file. Fatigue, generalized weakness, N/V, recent diarrhea   Nancy [name] is a 59 y.o. female.  The history is provided by the patient and medical records.  Illness Location:  Generalized fatigue and weakness Quality:  All over Severity:  Severe Onset quality:  Gradual Duration:  2 weeks Timing:  Constant Progression:  Waxing and waning Chronicity:  New Associated symptoms: diarrhea, fatigue, nausea and vomiting   Associated symptoms: no abdominal pain, no chest pain, no congestion, no cough, no fever, no headaches, no loss of consciousness, no rhinorrhea, no shortness of breath and no wheezing        Past Medical History:  Diagnosis Date  . Anemia 2006  . Depression 2014   previously on amitryptiline   . Diabetes mellitus with neurological manifestation ([place]) 2006  . Fracture of left ankle 1997   . Gastroparesis 07/2016  . HOH (hard of hearing) 2004   . Hyperlipidemia 2006  . Hypertension 2006  . IBS (irritable bowel syndrome) 2002   . Leukopenia 2015   . Macular degeneration 11/2019  . Shingles 2009   . Stroke ([place]) 2020  . Thyroid nodule 2004    Patient Active Problem List   Diagnosis Date Noted  . Generalized abdominal pain   . Pancreatitis, acute 02/29/2020  . Cerebrovascular accident (CVA) ([place]) 11/08/2019  . Stenosis of right carotid artery 11/08/2019  . Hyperlipidemia associated with type 2 diabetes mellitus ([place]) 11/08/2019  . Cortical age-related cataract of both eyes 08/30/2019  . Gait abnormality 08/30/2019  . Statin declined 08/30/2019  . Gastroesophageal reflux disease without esophagitis 04/18/2018  . 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 Uncontrolled type 2 diabetes mellitus with diabetic neuropathy, with long-term current use of insulin ([place]) 10/29/2017  . History of macular degeneration  10/29/2017  . Chronic cervical pain 10/29/2016  . Myalgia 09/14/2016  . Insomnia 09/14/2016  . Tinea pedis of both feet 08/20/2016  . Macular degeneration 08/17/2016  . Low back pain 09/21/2014  . Hypertension 09/21/2014  . Diabetic gastroparesis associated with type 2 diabetes mellitus ([place]) 09/14/2014  . Thyroid nodule 08/17/2014  . Obesity (BMI 30-39.9) 08/17/2014  . Hyperlipidemia 08/05/2014  . Type II diabetes mellitus with neurological manifestations, uncontrolled ([place]) 08/04/2014  . Elevated serum protein level 08/04/2014    Past Surgical History:  Procedure Laterality Date  . ABDOMINAL HYSTERECTOMY  2005  . CATARACT EXTRACTION Left 11/2019  . CESAREAN SECTION  1983   . CHOLECYSTECTOMY N/A 03/05/2020   Procedure: LAPAROSCOPIC CHOLECYSTECTOMY WITH INTRAOPERATIVE CHOLANGIOGRAM;  Surgeon: Donnie Mesa, MD;  Location: [facility];  Service: General;  Laterality: N/A;  . ESOPHAGOGASTRODUODENOSCOPY (EGD) WITH PROPOFOL Left 08/26/2014   Procedure: ESOPHAGOGASTRODUODENOSCOPY (EGD) WITH PROPOFOL;  Surgeon: Arta Silence, MD;  Location: [facility];  Service: Endoscopy;  Laterality: Left;  . ESOPHAGOGASTRODUODENOSCOPY (EGD) WITH PROPOFOL N/A 03/03/2020   Procedure: ESOPHAGOGASTRODUODENOSCOPY (EGD) WITH PROPOFOL;  Surgeon: Jerene Bears, MD;  Location: [facility];  Service: Gastroenterology;  Laterality: N/A;     OB History   No obstetric history on file.     Family History  Problem Relation Age of Onset  . Hypertension Mother   . Heart disease Mother   . Diabetes Mother   . Thyroid disease Mother   . Congestive Heart Failure Mother   . Breast cancer Maternal Grandmother   . Colon cancer Maternal Grandfather   .  Heart attack Sister   . Heart disease Brother   . Hyperlipidemia Brother   . Hypertension Brother   . Diabetes Father   . Breast cancer Maternal Aunt     Social History   Tobacco Use  . Smoking status: Former Smoker    Packs/day: 0.25    Years: 0.50    Pack  years: 0.12    Types: Cigarettes  . Smokeless tobacco: Never Used  . Tobacco comment: quit yrs ago  Substance Use Topics  . Alcohol use: No    Comment: occasion   . Drug use: No    Home Medications Prior to Admission medications   Medication Sig Start Date End Date Taking? Authorizing Provider  acetaminophen (TYLENOL) 500 MG tablet Take 2 tablets (1,000 mg total) by mouth every 8 (eight) hours as needed for mild pain or moderate pain. 02/03/18   Alfonse Spruce, FNP  amLODipine (NORVASC) 10 MG tablet Take 1 tablet (10 mg total) by mouth daily. 03/08/20 05/07/20  Kayleen Memos, DO  aspirin EC 81 MG tablet Take 1 tablet (81 mg total) by mouth daily. 11/05/19   [name], MD  atorvastatin (LIPITOR) 20 MG tablet Take 1 tablet (20 mg total) by mouth daily. 02/09/20   [name], MD  Blood Glucose Monitoring Suppl (TRUE METRIX METER) w/Device KIT Use as directed 11/08/19   [name], MD  DULoxetine (CYMBALTA) 20 MG capsule TAKE 1 CAPSULE (20 MG TOTAL) BY MOUTH DAILY. 02/12/20   [name], MD  ezetimibe (ZETIA) 10 MG tablet Take 10 mg by mouth daily. 02/12/20   [provider]  gabapentin (NEURONTIN) 300 MG capsule Take 1 capsule (300 mg total) by mouth at bedtime. 08/27/19   [name], MD  insulin aspart (NOVOLOG) 100 UNIT/ML FlexPen Inject 4 Units into the skin 3 (three) times daily with meals. 03/07/20   Kayleen Memos, DO  insulin glargine (LANTUS) 100 UNIT/ML Solostar Pen Inject 4 Units into the skin at bedtime. 03/07/20   Kayleen Memos, DO  Insulin Pen Needle (PEN NEEDLES) 32G X 4 MM MISC 1 each by Does not apply route 4 (four) times daily. 12/04/16   Funches, Adriana Mccallum, MD  metoCLOPramide (REGLAN) 5 MG tablet Take 1 tablet (5 mg total) by mouth 4 (four) times daily -  before meals and at bedtime for 10 days. 03/07/20 03/17/20  Kayleen Memos, DO  metoprolol tartrate (LOPRESSOR) 25 MG tablet Take 1 tablet (25 mg total) by mouth 2 (two) times  daily. 03/07/20 05/06/20  Kayleen Memos, DO  omeprazole (PRILOSEC) 20 MG capsule TAKE 1 CAPSULE (20 MG TOTAL) BY MOUTH DAILY. 06/24/19   [name], MD  ondansetron (ZOFRAN) 4 MG tablet Take 1 tablet (4 mg total) by mouth every 8 (eight) hours as needed for nausea or vomiting. 03/07/20   Kayleen Memos, DO  risperiDONE (RISPERDAL) 2 MG tablet Take 1 tablet (2 mg total) by mouth at bedtime. 02/28/20   [name], MD  senna-docusate (SENOKOT S) 8.6-50 MG tablet Take 1 tablet by mouth daily as needed for moderate constipation. 11/08/19   [name], MD  Insulin NPH Isophane & Regular (RELION 70/30 [place]) Inject 35 Units into the skin 2 (two) times daily.  08/17/14  [provider]    Allergies    Hydralazine hcl, Hydrocodone, Metformin and related, Plaquenil [hydroxychloroquine sulfate], and Sulfur  Review of Systems   Review of Systems  Constitutional: Positive for  fatigue. Negative for chills, diaphoresis and fever.  HENT: Negative for congestion and rhinorrhea.   Eyes: Negative for visual disturbance.  Respiratory: Negative for cough, chest tightness, shortness of breath and wheezing.   Cardiovascular: Negative for chest pain, palpitations and leg swelling.  Gastrointestinal: Positive for diarrhea, nausea and vomiting. Negative for abdominal distention, abdominal pain and constipation.  Genitourinary: Negative for dysuria, flank pain and frequency.  Musculoskeletal: Negative for back pain, neck pain and neck stiffness.  Skin: Negative for wound.  Neurological: Positive for light-headedness. Negative for dizziness, seizures, loss of consciousness, facial asymmetry, speech difficulty, weakness, numbness and headaches.  Psychiatric/Behavioral: Negative for agitation and confusion.  All other systems reviewed and are negative.   Physical Exam Updated Vital Signs There were no vitals taken for this visit.  Physical Exam Vitals and nursing note reviewed.    Constitutional:      General: She is not in acute distress.    Appearance: She is well-developed. She is not ill-appearing, toxic-appearing or diaphoretic.  HENT:     Head: Normocephalic and atraumatic.     Nose: No congestion or rhinorrhea.     Mouth/Throat:     Mouth: Mucous membranes are dry.     Pharynx: No oropharyngeal exudate or posterior oropharyngeal erythema.  Eyes:     Extraocular Movements: Extraocular movements intact.     Conjunctiva/sclera: Conjunctivae normal.     Pupils: Pupils are equal, round, and reactive to light.  Cardiovascular:     Rate and Rhythm: Normal rate and regular rhythm.     Pulses: Normal pulses.     Heart sounds: No murmur.  Pulmonary:     Effort: Pulmonary effort is normal. No respiratory distress.     Breath sounds: Normal breath sounds. No wheezing, rhonchi or rales.  Chest:     Chest wall: No tenderness.  Abdominal:     General: Abdomen is flat.     Palpations: Abdomen is soft.     Tenderness: There is no abdominal tenderness. There is no right CVA tenderness, left CVA tenderness, guarding or rebound.  Musculoskeletal:        General: No tenderness.     Cervical back: Neck supple. No tenderness.     Right lower leg: No edema.     Left lower leg: No edema.  Skin:    General: Skin is warm and dry.     Capillary Refill: Capillary refill takes less than 2 seconds.     Findings: No erythema.  Neurological:     General: No focal deficit present.     Mental Status: She is alert.     Sensory: No sensory deficit.     Motor: No weakness.  Psychiatric:        Mood and Affect: Mood normal.     ED Results / Procedures / Treatments   Labs (all labs ordered are listed, but only abnormal results are displayed) Labs Reviewed  CBC WITH DIFFERENTIAL/PLATELET - Abnormal; Notable for the following components:      Result Value   WBC 10.8 (*)    All other components within normal limits  COMPREHENSIVE METABOLIC PANEL - Abnormal; Notable for the  following components:   Chloride 97 (*)    Glucose, Bld 162 (*)    BUN 22 (*)    Creatinine, Ser 2.38 (*)    GFR calc non Af Amer 21 (*)    GFR calc Af Amer 25 (*)    All other components within normal limits  URINALYSIS, ROUTINE W REFLEX MICROSCOPIC - Abnormal; Notable for the following components:   APPearance CLOUDY (*)    All other components within normal limits  COMPREHENSIVE METABOLIC PANEL - Abnormal; Notable for the following components:   Glucose, Bld 191 (*)    BUN 27 (*)    Creatinine, Ser 2.42 (*)    GFR calc non Af Amer 21 (*)    GFR calc Af Amer 24 (*)    All other components within normal limits  GLUCOSE, CAPILLARY - Abnormal; Notable for the following components:   Glucose-Capillary 177 (*)    All other components within normal limits  RESPIRATORY PANEL BY RT PCR (FLU A&B, COVID)  URINE CULTURE  LIPASE, BLOOD  LACTIC ACID, PLASMA  LACTIC ACID, PLASMA  MAGNESIUM  CK  TSH  SODIUM, URINE, RANDOM  CREATININE, URINE, RANDOM  MAGNESIUM  HIV ANTIBODY (ROUTINE TESTING W REFLEX)  CBC WITH DIFFERENTIAL/PLATELET  CBC WITH DIFFERENTIAL/PLATELET  TROPONIN I (HIGH SENSITIVITY)  TROPONIN I (HIGH SENSITIVITY)    EKG EKG Interpretation  Date/Time:  Wednesday Apr 20 2020 17:05:33 EDT Ventricular Rate:  84 PR Interval:    QRS Duration: 81 QT Interval:  393 QTC Calculation: 465 R Axis:   -30 Text Interpretation: Sinus rhythm Ventricular premature complex Left axis deviation Nonspecific T abnormalities, lateral leads When compared to prior, no significant changes see. No STEMI Confirmed by Antony Blackbird [id number]) on 04/20/2020 5:35:21 PM   Radiology DG Chest 2 View  Result Date: 04/20/2020 CLINICAL DATA:  Generalized weakness, fatigue, nausea, vomiting EXAM: CHEST - 2 VIEW COMPARISON:  03/01/2020 FINDINGS: Frontal and lateral views of the chest demonstrate a stable cardiac silhouette. No airspace disease, effusion, or pneumothorax. No acute bony abnormalities.  IMPRESSION: 1. Stable chest, no acute process. Electronically Signed   By: Randa Ngo M.D.   On: 04/20/2020 17:25   DG Abd 2 Views  Result Date: 04/20/2020 CLINICAL DATA:  Nausea and vomiting EXAM: ABDOMEN - 2 VIEW COMPARISON:  03/07/2020 FINDINGS: Surgical clips in the right upper quadrant. Moderate stool in the colon. No definite dilated small bowel. Scattered fluid levels within the central and right lower quadrant of the abdomen. IMPRESSION: Scattered fluid levels over the central and right lower quadrant of the abdomen, possible enteritis. If bowel obstruction is a concern, CT could be obtained for further evaluation. Electronically Signed   By: Donavan Foil M.D.   On: 04/20/2020 23:17    Procedures Procedures (including critical care time)  Medications Ordered in ED Medications  0.9 %  sodium chloride infusion ( Intravenous New Bag/Given 04/20/20 2348)  metoCLOPramide (REGLAN) injection 5 mg (5 mg Intravenous Given 04/21/20 0005)  aspirin EC tablet 81 mg (has no administration in time range)  amLODipine (NORVASC) tablet 10 mg (has no administration in time range)  atorvastatin (LIPITOR) tablet 20 mg (has no administration in time range)  ezetimibe (ZETIA) tablet 10 mg (has no administration in time range)  metoprolol tartrate (LOPRESSOR) tablet 25 mg (25 mg Oral Given 04/21/20 0005)  DULoxetine (CYMBALTA) DR capsule 20 mg (has no administration in time range)  risperiDONE (RISPERDAL) tablet 2 mg (2 mg Oral Given 04/21/20 0005)  senna-docusate (Senokot-S) tablet 1 tablet (has no administration in time range)  gabapentin (NEURONTIN) capsule 300 mg (300 mg Oral Given 04/21/20 0005)  heparin injection 5,000 Units (5,000 Units Subcutaneous Given 04/21/20 0005)  sodium chloride flush (NS) 0.9 % injection 3 mL (3 mLs Intravenous Not Given 04/20/20 2350)  acetaminophen (TYLENOL) tablet 650 mg (650  mg Oral Given 04/21/20 0015)    Or  acetaminophen (TYLENOL) suppository 650 mg ( Rectal See Alternative  04/21/20 0015)  sorbitol 70 % solution 30 mL (has no administration in time range)  ondansetron (ZOFRAN) tablet 4 mg (has no administration in time range)    Or  ondansetron (ZOFRAN) injection 4 mg (has no administration in time range)  metoprolol tartrate (LOPRESSOR) injection 5 mg (has no administration in time range)  insulin glargine (LANTUS) injection 5 Units (5 Units Subcutaneous Given 04/20/20 2349)  insulin aspart (novoLOG) injection 0-9 Units (has no administration in time range)  insulin aspart (novoLOG) injection 0-5 Units (0 Units Subcutaneous Not Given 04/20/20 2355)  pantoprazole (PROTONIX) EC tablet 40 mg (has no administration in time range)  bisacodyl (DULCOLAX) suppository 10 mg (has no administration in time range)  sodium chloride 0.9 % bolus 1,000 mL (0 mLs Intravenous Stopped 04/20/20 2206)  ondansetron (ZOFRAN) injection 4 mg (4 mg Intravenous Given 04/20/20 1713)  acetaminophen (TYLENOL) tablet 650 mg (650 mg Oral Given 04/20/20 2007)    ED Course  I have reviewed the triage vital signs and the nursing notes.  Pertinent labs & imaging results that were available during my care of the patient were reviewed by me and considered in my medical decision making (see chart for details).    MDM Rules/Calculators/A&P                      Nancy [name] is a 59 y.o. female with a past medical history significant for hypertension, hyperlipidemia, diabetes with prior gastroparesis, prior stroke, recent gallstone pancreatitis status post cholecystectomy on 03/05/2020, anemia, and irritable bowel syndrome who presents with recent diarrhea as well as several weeks of worsening generalized fatigue, weakness, and 1 day of nausea, vomiting, and lightheadedness.  Patient reports that the last 2 weeks has been feeling worsening fatigue and generalized weakness.  There is no focal complaints from a neurologic standpoint.  She says that she has been so fatigued and tired that she has been  lightheaded and unable to ambulate well today.  She says that several days ago she had multiple days of diarrhea that has resolved.  She says that she has had nausea and vomiting today and she has not been able to keep fluids down.  She feels very lightheaded when she sits up or stands up.  She has not had any reported complications from her recent cholecystectomy 2 months ago and says she was doing well until the last 2 weeks.  She reports no chest pain or palpitations, shortness of breath.  Although feeling lightheaded, she has not syncopized.  She denies any numbness, tingling, or focal weakness of extremity.  No difficulty with speech, facial droop, or true dizziness.  She denies any headache, neck pain, neck stiffness.  She denies any new back pains.  She denies any trauma.  EMS reports that her vital signs were reassuring in route.  On exam, patient has no focal deficits with normal sensation and strength in extremities.  Normal finger-nose-finger testing bilaterally.  Symmetric smile.  Pupils are symmetric and reactive with normal extraocular movements.  Clear speech.  Lungs were clear and chest was nontender.  Abdomen was nontender.  No CVA or back tenderness.  No neck tenderness.  Patient resting comfortably with her emesis bag due to the nausea vomiting today.  Clinically suspect patient is dehydrated given her recent bout of diarrhea as well as the new nausea and vomiting with  decreased oral intake.  EMS reports that patient told them she did not get up for the last day and a half from the couch because she was too tired and fatigued.  Question possible rhabdo she is not improving.  Will get labs to look for dehydration or occult infection prompting her generalized fatigue.  We will give her some fluids and nausea medicine initially.  Will make sure this is not a cardiac cause of her generalized fatigue and weakness.  Anticipate reassessment for work-up and if work-up is reassuring and patient feels  better after fluids, anticipate discharge home.   Work-up reveals acute kidney injury.  Due to her fatigue and new AKI with the nausea vomiting, diarrhea, suspect is related to dehydration.  She will be admitted for rehydration and renal function monitoring.    Final Clinical Impression(s) / ED Diagnoses Final diagnoses:  AKI (acute kidney injury) ([place])    Clinical Impression: 1. AKI (acute kidney injury) ([place])   2. Nausea & vomiting     Disposition: Admit  This note was prepared with assistance of Dragon voice recognition software. Occasional wrong-word or sound-a-like substitutions may have occurred due to the inherent limitations of voice recognition software.     Lavina Resor, Gwenyth Allegra, MD 04/21/20 [phone]

## 2020-04-20 NOTE — ED Notes (Signed)
Pt placed on bedpan to provide urine sample

## 2020-04-21 ENCOUNTER — Observation Stay (HOSPITAL_COMMUNITY): Payer: 59

## 2020-04-21 DIAGNOSIS — K589 Irritable bowel syndrome without diarrhea: Secondary | ICD-10-CM | POA: Diagnosis present

## 2020-04-21 DIAGNOSIS — N179 Acute kidney failure, unspecified: Principal | ICD-10-CM

## 2020-04-21 DIAGNOSIS — Z6835 Body mass index (BMI) 35.0-35.9, adult: Secondary | ICD-10-CM | POA: Diagnosis not present

## 2020-04-21 DIAGNOSIS — K3184 Gastroparesis: Secondary | ICD-10-CM

## 2020-04-21 DIAGNOSIS — E669 Obesity, unspecified: Secondary | ICD-10-CM | POA: Diagnosis present

## 2020-04-21 DIAGNOSIS — H9190 Unspecified hearing loss, unspecified ear: Secondary | ICD-10-CM | POA: Diagnosis present

## 2020-04-21 DIAGNOSIS — I69354 Hemiplegia and hemiparesis following cerebral infarction affecting left non-dominant side: Secondary | ICD-10-CM | POA: Diagnosis not present

## 2020-04-21 DIAGNOSIS — Z20822 Contact with and (suspected) exposure to covid-19: Secondary | ICD-10-CM | POA: Diagnosis present

## 2020-04-21 DIAGNOSIS — Z803 Family history of malignant neoplasm of breast: Secondary | ICD-10-CM | POA: Diagnosis not present

## 2020-04-21 DIAGNOSIS — Z83438 Family history of other disorder of lipoprotein metabolism and other lipidemia: Secondary | ICD-10-CM | POA: Diagnosis not present

## 2020-04-21 DIAGNOSIS — Z87891 Personal history of nicotine dependence: Secondary | ICD-10-CM | POA: Diagnosis not present

## 2020-04-21 DIAGNOSIS — E1143 Type 2 diabetes mellitus with diabetic autonomic (poly)neuropathy: Secondary | ICD-10-CM

## 2020-04-21 DIAGNOSIS — E861 Hypovolemia: Secondary | ICD-10-CM | POA: Diagnosis present

## 2020-04-21 DIAGNOSIS — Z8249 Family history of ischemic heart disease and other diseases of the circulatory system: Secondary | ICD-10-CM | POA: Diagnosis not present

## 2020-04-21 DIAGNOSIS — E1149 Type 2 diabetes mellitus with other diabetic neurological complication: Secondary | ICD-10-CM | POA: Diagnosis present

## 2020-04-21 DIAGNOSIS — E860 Dehydration: Secondary | ICD-10-CM

## 2020-04-21 DIAGNOSIS — I10 Essential (primary) hypertension: Secondary | ICD-10-CM | POA: Diagnosis present

## 2020-04-21 DIAGNOSIS — H3530 Unspecified macular degeneration: Secondary | ICD-10-CM | POA: Diagnosis present

## 2020-04-21 DIAGNOSIS — E785 Hyperlipidemia, unspecified: Secondary | ICD-10-CM | POA: Diagnosis present

## 2020-04-21 DIAGNOSIS — H25013 Cortical age-related cataract, bilateral: Secondary | ICD-10-CM | POA: Diagnosis present

## 2020-04-21 DIAGNOSIS — R5383 Other fatigue: Secondary | ICD-10-CM | POA: Diagnosis present

## 2020-04-21 DIAGNOSIS — N178 Other acute kidney failure: Secondary | ICD-10-CM | POA: Diagnosis not present

## 2020-04-21 DIAGNOSIS — Z800 Family history of malignant neoplasm of digestive organs: Secondary | ICD-10-CM | POA: Diagnosis not present

## 2020-04-21 DIAGNOSIS — Z833 Family history of diabetes mellitus: Secondary | ICD-10-CM | POA: Diagnosis not present

## 2020-04-21 DIAGNOSIS — K219 Gastro-esophageal reflux disease without esophagitis: Secondary | ICD-10-CM | POA: Diagnosis present

## 2020-04-21 DIAGNOSIS — Z8349 Family history of other endocrine, nutritional and metabolic diseases: Secondary | ICD-10-CM | POA: Diagnosis not present

## 2020-04-21 LAB — GLUCOSE, CAPILLARY
Glucose-Capillary: 131 mg/dL — ABNORMAL HIGH (ref 70–99)
Glucose-Capillary: 181 mg/dL — ABNORMAL HIGH (ref 70–99)
Glucose-Capillary: 190 mg/dL — ABNORMAL HIGH (ref 70–99)
Glucose-Capillary: 165 mg/dL — ABNORMAL HIGH (ref 70–99)

## 2020-04-21 LAB — CBC WITH DIFFERENTIAL/PLATELET
Abs Immature Granulocytes: 0.07 10*3/uL (ref 0.00–0.07)
Basophils Absolute: 0.1 10*3/uL (ref 0.0–0.1)
Basophils Relative: 0 %
Eosinophils Absolute: 0.2 10*3/uL (ref 0.0–0.5)
Eosinophils Relative: 2 %
HCT: 35.6 % — ABNORMAL LOW (ref 36.0–46.0)
Hemoglobin: 10.9 g/dL — ABNORMAL LOW (ref 12.0–15.0)
Immature Granulocytes: 1 %
Lymphocytes Relative: 31 %
Lymphs Abs: 4.3 10*3/uL — ABNORMAL HIGH (ref 0.7–4.0)
MCH: 28.3 pg (ref 26.0–34.0)
MCHC: 30.6 g/dL (ref 30.0–36.0)
MCV: 92.5 fL (ref 80.0–100.0)
Monocytes Absolute: 1.4 10*3/uL — ABNORMAL HIGH (ref 0.1–1.0)
Monocytes Relative: 10 %
Neutro Abs: 8.2 10*3/uL — ABNORMAL HIGH (ref 1.7–7.7)
Neutrophils Relative %: 56 %
Platelets: 247 10*3/uL (ref 150–400)
RBC: 3.85 MIL/uL — ABNORMAL LOW (ref 3.87–5.11)
RDW: 12.5 % (ref 11.5–15.5)
WBC: 14.2 10*3/uL — ABNORMAL HIGH (ref 4.0–10.5)
nRBC: 0 % (ref 0.0–0.2)

## 2020-04-21 LAB — BASIC METABOLIC PANEL
Anion gap: 10 (ref 5–15)
BUN: 31 mg/dL — ABNORMAL HIGH (ref 6–20)
CO2: 24 mmol/L (ref 22–32)
Calcium: 8.7 mg/dL — ABNORMAL LOW (ref 8.9–10.3)
Chloride: 103 mmol/L (ref 98–111)
Creatinine, Ser: 1.89 mg/dL — ABNORMAL HIGH (ref 0.44–1.00)
GFR calc Af Amer: 33 mL/min — ABNORMAL LOW (ref >60)
GFR calc non Af Amer: 28 mL/min — ABNORMAL LOW (ref >60)
Glucose, Bld: 244 mg/dL — ABNORMAL HIGH (ref 70–99)
Potassium: 4.1 mmol/L (ref 3.5–5.1)
Sodium: 137 mmol/L (ref 135–145)

## 2020-04-21 LAB — URINE CULTURE

## 2020-04-21 LAB — RESPIRATORY PANEL BY RT PCR (FLU A&B, COVID)
Influenza A by PCR: NEGATIVE
Influenza B by PCR: NEGATIVE
SARS Coronavirus 2 by RT PCR: NEGATIVE

## 2020-04-21 LAB — COMPREHENSIVE METABOLIC PANEL
ALT: 15 U/L (ref 0–44)
AST: 16 U/L (ref 15–41)
Albumin: 3.7 g/dL (ref 3.5–5.0)
Alkaline Phosphatase: 58 U/L (ref 38–126)
Anion gap: 13 (ref 5–15)
BUN: 27 mg/dL — ABNORMAL HIGH (ref 6–20)
CO2: 22 mmol/L (ref 22–32)
Calcium: 9 mg/dL (ref 8.9–10.3)
Chloride: 101 mmol/L (ref 98–111)
Creatinine, Ser: 2.42 mg/dL — ABNORMAL HIGH (ref 0.44–1.00)
GFR calc Af Amer: 24 mL/min — ABNORMAL LOW (ref >60)
GFR calc non Af Amer: 21 mL/min — ABNORMAL LOW (ref >60)
Glucose, Bld: 191 mg/dL — ABNORMAL HIGH (ref 70–99)
Potassium: 4.5 mmol/L (ref 3.5–5.1)
Sodium: 136 mmol/L (ref 135–145)
Total Bilirubin: 0.7 mg/dL (ref 0.3–1.2)
Total Protein: 7.5 g/dL (ref 6.5–8.1)

## 2020-04-21 LAB — LACTIC ACID, PLASMA: Lactic Acid, Venous: 1.8 mmol/L (ref 0.5–1.9)

## 2020-04-21 LAB — HIV ANTIBODY (ROUTINE TESTING W REFLEX): HIV Screen 4th Generation wRfx: NONREACTIVE

## 2020-04-21 LAB — MAGNESIUM: Magnesium: 1.8 mg/dL (ref 1.7–2.4)

## 2020-04-21 MED ORDER — BISACODYL 10 MG RE SUPP: 10.0000 mg | Freq: Once | RECTAL | Status: DC

## 2020-04-21 NOTE — Progress Notes (Signed)
[facility]                                                                              Patient Demographics  Nancy [name], is a 59 y.o. female, DOB - 06-02-1961, ZES:[id number]  Admit date - 04/20/2020   Admitting Physician Eugenie Filler, MD  Outpatient Primary MD for the patient is [name], MD  Outpatient specialists:   LOS - 0  days   Medical records reviewed and are as summarized below:    Chief Complaint  Patient presents with  . 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  Patient was unable to get out of the couch over the last 2 days due to weakness and had falls x2 about a week ago  In ED, creatinine 2.38, lipase 17, magnesium 1.9, UA negative for UTI.  Patient was admitted for further work-up, acute kidney injury  Assessment & Plan    Principal Problem: Acute kidney injury ([place]) -Likely prerenal azotemia secondary to intractable nausea vomiting, diarrhea, poor p.o. intake, medications including lisinopril, HCTZ - Continue to hold lisinopril, HCTZ, creatinine trended up to 2.4 today, baseline 0.8 - Continue aggressive IV fluid hydration  - Renal ultrasound normal, no hydronephrosis or obstructive uropathy  - will recheck renal function later today, if no significant improvement, obtain nephrology consult.  UA with negative proteinuria, no UTI or ketones.   Active Problems:   Nausea & vomiting underlying history of gastroparesis -Currently improving, no active nausea or vomiting -Continue IV  fluid hydration, lipase within normal limits, no transaminitis -KUB consistent with enteritis -Continue Reglan  Dehydration Continue aggressive IV fluid hydration    Type II diabetes mellitus with neurological manifestations, uncontrolled ([place]), with gastroparesis -Last hemoglobin A1c 10.8 in 02/2020 -Good glycemic control, continue Lantus 5 units at bedtime, NovoLog meal coverage    Hyperlipidemia -Continue Zetia    Diabetic gastroparesis associated with type 2 diabetes mellitus (HCC) -Continue Reglan  GERD -Continue PPI  History of nonhemorrhagic CVA Continue aspirin for secondary stroke prophylaxis, resume statin, Zetia - will start PT OT  Hypertension Resume Norvasc, continue to hold HCTZ, lisinopril  Obesity Estimated body mass index is 35.78 kg/m as calculated from the following:   Height as of 02/29/20: 5\' 3"  (1.6 m).   Weight as of 02/29/20: 91.6 kg.  Code Status: Full CODE STATUS DVT Prophylaxis: Heparin subcu Family Communication: Discussed all imaging results, lab results, explained to the patient. Called patient's husband and updated on phone.    Disposition Plan:     Status is: Observation  The patient remains OBS appropriate and will d/c before 2 midnights.  Dispo: The patient is from: Home              Anticipated d/c is  to: Home              Anticipated d/c date is: 1 day              Patient currently is not medically stable to d/c.  Creatinine still trending up, will continue aggressive IV fluid hydration, recheck bmet later today if improvement in the trend, otherwise will need nephrology consult. PT eval pending   Time Spent in minutes 35 minutes  Procedures:  Renal ultrasound  Consultants:   None  Antimicrobials:   Anti-infectives (From admission, onward)   None          Medications  Scheduled Meds: . amLODipine  10 mg Oral Daily  . aspirin EC  81 mg Oral Daily  . atorvastatin  20 mg Oral Daily  . bisacodyl  10 mg Rectal Once    . DULoxetine  20 mg Oral Daily  . ezetimibe  10 mg Oral Daily  . gabapentin  300 mg Oral QHS  . heparin  5,000 Units Subcutaneous Q8H  . insulin aspart  0-5 Units Subcutaneous QHS  . insulin aspart  0-9 Units Subcutaneous TID WC  . insulin glargine  5 Units Subcutaneous QHS  . metoCLOPramide (REGLAN) injection  5 mg Intravenous Q6H  . metoprolol tartrate  25 mg Oral BID  . pantoprazole  40 mg Oral Q0600  . risperiDONE  2 mg Oral QHS  . sodium chloride flush  3 mL Intravenous Q12H   Continuous Infusions: . sodium chloride 125 mL/hr at 04/21/20 0723   PRN Meds:.acetaminophen **OR** acetaminophen, metoprolol tartrate, ondansetron **OR** ondansetron (ZOFRAN) IV, senna-docusate, sorbitol      Subjective:   Koraline Phillipson was seen and examined today.  Patient reports that nausea vomiting is improving, wants to eat solid diet.  No diarrhea.  Patient denies dizziness, chest pain, shortness of breath, abdominal pain, new weakness, numbess, tingling. No acute events overnight.    Objective:   Vitals:   04/20/20 2245 04/20/20 2345 04/21/20 0320 04/21/20 0755  BP: 118/84 123/79 120/74 129/65  Pulse: 87 91 85 75  Resp: 16 17 16 15   Temp:  98.1 F (36.7 C) 97.9 F (36.6 C) 97.9 F (36.6 C)  TempSrc:  Oral Oral Oral  SpO2: 97% 98% 100% 98%    Intake/Output Summary (Last 24 hours) at 04/21/2020 1013 Last data filed at 04/20/2020 2206 Gross per 24 hour  Intake 1000 ml  Output --  Net 1000 ml     Wt Readings from Last 3 Encounters:  02/29/20 91.6 kg  02/18/20 91.6 kg  11/05/19 94.1 kg     Exam  General: Alert and oriented x 3, NAD  Cardiovascular: S1 S2 auscultated, no murmurs, RRR  Respiratory: Clear to auscultation bilaterally, no wheezing, rales or rhonchi  Gastrointestinal: Soft, nontender, nondistended, + bowel sounds  Ext: no pedal edema bilaterally  Neuro: AAOx3, Cr N's II- XII. Strength 5/5 upper and lower extremities bilaterally  Musculoskeletal: No  digital cyanosis, clubbing  Skin: No rashes  Psych: Normal affect and demeanor, alert and oriented x3    Data Reviewed:  I have personally reviewed following labs and imaging studies  Micro Results Recent Results (from the past 240 hour(s))  Respiratory Panel by RT PCR (Flu A&B, Covid) - Nasopharyngeal Swab     Status: None   Collection Time: 04/20/20 11:18 PM   Specimen: Nasopharyngeal Swab  Result Value Ref Range Status   SARS Coronavirus 2 by RT PCR NEGATIVE NEGATIVE Final  Comment: (NOTE) SARS-CoV-2 target nucleic acids are NOT DETECTED. The SARS-CoV-2 RNA is generally detectable in upper respiratoy specimens during the acute phase of infection. The lowest concentration of SARS-CoV-2 viral copies this assay can detect is 131 copies/mL. A negative result does not preclude SARS-Cov-2 infection and should not be used as the sole basis for treatment or other patient management decisions. A negative result may occur with  improper specimen collection/handling, submission of specimen other than nasopharyngeal swab, presence of viral mutation(s) within the areas targeted by this assay, and inadequate number of viral copies (<131 copies/mL). A negative result must be combined with clinical observations, patient history, and epidemiological information. The expected result is Negative. Fact Sheet for Patients:  PinkCheek.be Fact Sheet for Healthcare Providers:  GravelBags.it This test is not yet ap proved or cleared by the [place] FDA and  has been authorized for detection and/or diagnosis of SARS-CoV-2 by FDA under an Emergency Use Authorization (EUA). This EUA will remain  in effect (meaning this test can be used) for the duration of the COVID-19 declaration under Section 564(b)(1) of the Act, 21 U.S.C. section 360bbb-3(b)(1), unless the authorization is terminated or revoked sooner.    Influenza A by PCR NEGATIVE  NEGATIVE Final   Influenza B by PCR NEGATIVE NEGATIVE Final    Comment: (NOTE) The Xpert Xpress SARS-CoV-2/FLU/RSV assay is intended as an aid in  the diagnosis of influenza from Nasopharyngeal swab specimens and  should not be used as a sole basis for treatment. Nasal washings and  aspirates are unacceptable for Xpert Xpress SARS-CoV-2/FLU/RSV  testing. Fact Sheet for Patients: PinkCheek.be Fact Sheet for Healthcare Providers: GravelBags.it This test is not yet approved or cleared by the [place] FDA and  has been authorized for detection and/or diagnosis of SARS-CoV-2 by  FDA under an Emergency Use Authorization (EUA). This EUA will remain  in effect (meaning this test can be used) for the duration of the  Covid-19 declaration under Section 564(b)(1) of the Act, 21  U.S.C. section 360bbb-3(b)(1), unless the authorization is  terminated or revoked. Performed at [facility], [place] [address] [place], [place] [address]     Radiology Reports DG Chest 2 View  Result Date: 04/20/2020 CLINICAL DATA:  Generalized weakness, fatigue, nausea, vomiting EXAM: CHEST - 2 VIEW COMPARISON:  03/01/2020 FINDINGS: Frontal and lateral views of the chest demonstrate a stable cardiac silhouette. No airspace disease, effusion, or pneumothorax. No acute bony abnormalities. IMPRESSION: 1. Stable chest, no acute process. Electronically Signed   By: Randa Ngo M.D.   On: 04/20/2020 17:25   US RENAL  Result Date: 04/21/2020 CLINICAL DATA:  Acute kidney injury.  Hypertension and diabetes. EXAM: RENAL / URINARY TRACT ULTRASOUND COMPLETE COMPARISON:  None. FINDINGS: Right Kidney: Renal measurements: 12.2 x 5.3 x 5.5 cm = volume: 185.9 mL . Echogenicity within normal limits. No mass or hydronephrosis visualized. Left Kidney: Renal measurements: 12.6 x 5.1 x 4.8 cm = volume: 161 mL. Echogenicity within normal limits. No mass or  hydronephrosis visualized. Bladder: Appears normal for degree of bladder distention. Other: None. IMPRESSION: 1. Normal renal sonogram. Electronically Signed   By: Kerby Moors M.D.   On: 04/21/2020 08:47   DG Abd 2 Views  Result Date: 04/20/2020 CLINICAL DATA:  Nausea and vomiting EXAM: ABDOMEN - 2 VIEW COMPARISON:  03/07/2020 FINDINGS: Surgical clips in the right upper quadrant. Moderate stool in the colon. No definite dilated small bowel. Scattered fluid levels within the central and right lower  quadrant of the abdomen. IMPRESSION: Scattered fluid levels over the central and right lower quadrant of the abdomen, possible enteritis. If bowel obstruction is a concern, CT could be obtained for further evaluation. Electronically Signed   By: Donavan Foil M.D.   On: 04/20/2020 23:17    Lab Data:  CBC: Recent Labs  Lab 04/20/20 1651 04/21/20 0201  WBC 10.8* 14.2*  NEUTROABS 7.7 8.2*  HGB 12.5 10.9*  HCT 39.1 35.6*  MCV 90.5 92.5  PLT 313 583   Basic Metabolic Panel: Recent Labs  Lab 04/20/20 1651 04/21/20 0037  NA 136 136  K 4.8 4.5  CL 97* 101  CO2 26 22  GLUCOSE 162* 191*  BUN 22* 27*  CREATININE 2.38* 2.42*  CALCIUM 9.3 9.0  MG 1.9 1.8   GFR: CrCl cannot be calculated (Unknown ideal weight.). Liver Function Tests: Recent Labs  Lab 04/20/20 1651 04/21/20 0037  AST 17 16  ALT 15 15  ALKPHOS 61 58  BILITOT 0.9 0.7  PROT 8.0 7.5  ALBUMIN 4.0 3.7   Recent Labs  Lab 04/20/20 1651  LIPASE 17   No results for input(s): AMMONIA in the last 168 hours. Coagulation Profile: No results for input(s): INR, PROTIME in the last 168 hours. Cardiac Enzymes: Recent Labs  Lab 04/20/20 1651  CKTOTAL 68   BNP (last 3 results) No results for input(s): PROBNP in the last 8760 hours. HbA1C: No results for input(s): HGBA1C in the last 72 hours. CBG: Recent Labs  Lab 04/20/20 2341 04/21/20 0805  GLUCAP 177* 131*   Lipid Profile: No results for input(s): CHOL, HDL,  LDLCALC, TRIG, CHOLHDL, LDLDIRECT in the last 72 hours. Thyroid Function Tests: Recent Labs    04/20/20 1652  TSH 1.420   Anemia Panel: No results for input(s): VITAMINB12, FOLATE, FERRITIN, TIBC, IRON, RETICCTPCT in the last 72 hours. Urine analysis:    Component Value Date/Time   COLORURINE YELLOW 04/20/2020 2008   APPEARANCEUR CLOUDY (A) 04/20/2020 2008   LABSPEC 1.009 04/20/2020 2008   PHURINE 5.0 04/20/2020 2008   GLUCOSEU NEGATIVE 04/20/2020 2008   HGBUR NEGATIVE 04/20/2020 2008   BILIRUBINUR NEGATIVE 04/20/2020 2008   BILIRUBINUR negative 12/26/2017 [place] 04/20/2020 2008   PROTEINUR NEGATIVE 04/20/2020 2008   UROBILINOGEN 0.2 12/26/2017 1458   UROBILINOGEN 0.2 09/07/2014 0857   NITRITE NEGATIVE 04/20/2020 2008   LEUKOCYTESUR NEGATIVE 04/20/2020 2008     Issaac Shipper M.D. [facility] 04/21/2020, 10:13 AM   Call night coverage person covering after 7pm

## 2020-04-21 NOTE — Evaluation (Signed)
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  Patient was unable to get out of the couch over the last 2 days due to weakness and had falls x2 about a week ago    Clinical Impression  Pt admitted with above diagnosis. Pt with initial unsteadiness upon standing, but improves with cues and use of RW. Pt able to take steps in room, but due to fear of falling requests to return to sitting in bedside chair; denies pain, lightheadedness, dizziness. Pt does endorse multiple falls over the last week which has increased her fear of falling. Pt currently with functional limitations due to the deficits listed below (see PT Problem List). Pt will benefit from skilled PT to increase their independence and safety with mobility to allow discharge to the venue listed below.       Follow Up Recommendations Home health PT;Supervision - Intermittent    Equipment Recommendations  3in1 (PT)    Recommendations for Other Services       Precautions / Restrictions Precautions Precautions: Fall Restrictions Weight Bearing Restrictions: No      Mobility  Bed Mobility Overal bed mobility: Needs Assistance Bed Mobility: Supine to Sit  Supine to sit: Supervision;HOB elevated  General bed mobility comments: use of bedrail, HOB elevated, slow, labored movement  Transfers Overall transfer level: Needs assistance Equipment used: Rolling walker (2 wheeled) Transfers:  Sit to/from [facility] Sit to Stand: Min guard Stand pivot transfers: Min guard  General transfer comment: slow, labored movement, weight through heels requiring cues for flat foot posture  Ambulation/Gait Ambulation/Gait assistance: Min guard Gait Distance (Feet): 5 Feet Assistive device: Rolling walker (2 wheeled) Gait Pattern/deviations: Step-to pattern;Decreased stride length;Shuffle Gait velocity: decreased  General Gait Details: slow, steady steps, pt appears fearful of falling and requests to sit down, reliant on RW  Stairs            Wheelchair Mobility    Modified Rankin (Stroke Patients Only)       Balance Overall balance assessment: Needs assistance;History of Falls Sitting-balance support: Feet supported;No upper extremity supported Sitting balance-Leahy Scale: Good Sitting balance - Comments: seated EOB   Standing balance support: During functional activity;Bilateral upper extremity supported Standing balance-Leahy Scale: Poor Standing balance comment: reliant on RW, appears fearful of falling                Pertinent Vitals/Pain Pain Assessment: No/denies pain    Home Living Family/patient expects to be discharged to:: Private residence Living Arrangements: Spouse/significant other(works nights) Available Help at Discharge: Family;Available PRN/intermittently(pt's sister) Type of Home: Apartment Home Access: Stairs to enter   Entrance Stairs-Number of Steps: 1 threshold Home Layout: One level Home Equipment: Walker - 2 wheels;Cane - single point;Shower seat      Prior Function Level of Independence: Needs assistance   Gait / Transfers Assistance Needed: household ambulator with RW/SPC, uses riding cart in stores  ADL's / Homemaking Assistance Needed: spouse cooks and cleans; independent with bathing, dressing, laundry  Comments: sister provides transportation     Journalist, newspaper  Dominant Hand: Right     Extremity/Trunk Assessment   Upper Extremity Assessment Upper Extremity Assessment: Defer to OT evaluation    Lower Extremity Assessment Lower Extremity Assessment: Overall WFL for tasks assessed(BLE AROM WNL, strength 4/5)    Cervical / Trunk Assessment Cervical / Trunk Assessment: Normal  Communication      Cognition Arousal/Alertness: Awake/alert Behavior During Therapy: Flat affect Overall Cognitive Status: No family/caregiver present to determine baseline cognitive functioning             General Comments      Exercises     Assessment/Plan    PT Assessment Patient needs continued PT services  PT Problem List Decreased strength;Decreased activity tolerance;Decreased balance;Decreased mobility;Decreased knowledge of use of DME       PT Treatment Interventions DME instruction;Gait training;Functional mobility training;Therapeutic activities;Therapeutic exercise;Balance training;Neuromuscular re-education;Patient/family education    PT Goals (Current goals can be found in the Care Plan section)  Acute Rehab PT Goals Patient Stated Goal: return home with spouse PT Goal Formulation: With patient Time For Goal Achievement: 04/28/20 Potential to Achieve Goals: Good    Frequency Min 3X/week   Barriers to discharge        Co-evaluation               AM-PAC PT "6 Clicks" Mobility  Outcome Measure Help needed turning from your back to your side while in a flat bed without using bedrails?: A Little Help needed moving from lying on your back to sitting on the side of a flat bed without using bedrails?: A Little Help needed moving to and from a bed to a chair (including a wheelchair)?: A Little Help needed standing up from a chair using your arms (e.g., wheelchair or bedside chair)?: A Little Help needed to walk in hospital room?: A Little Help needed climbing 3-5 steps with a railing? : A Lot 6 Click Score: 17    End of Session Equipment Utilized During  Treatment: Gait belt Activity Tolerance: Patient tolerated treatment well;Other (comment)(fearful of falling) Patient left: in chair;with call bell/phone within reach;with chair alarm set Nurse Communication: Mobility status PT Visit Diagnosis: Unsteadiness on feet (R26.81);Other abnormalities of gait and mobility (R26.89);History of falling (Z91.81)    Time: 9417-4081 PT Time Calculation (min) (ACUTE ONLY): 26 min   Charges:   PT Evaluation $PT Eval Moderate Complexity: 1 Mod         Tori Adlyn Fife PT, DPT 04/21/20, 1:00 PM [phone]

## 2020-04-21 NOTE — Progress Notes (Signed)
Inpatient Diabetes Program Recommendations  AACE/ADA: New Consensus Statement on Inpatient Glycemic Control (2015)  Target Ranges:  Prepandial:   less than 140 mg/dL      Peak postprandial:   less than 180 mg/dL (1-2 hours)      Critically ill patients:  140 - 180 mg/dL   Lab Results  Component Value Date   GLUCAP 181 (H) 04/21/2020   HGBA1C 10.8 (H) 02/29/2020    Review of Glycemic Control  Diabetes history: DM2 Outpatient Diabetes medications: Lantus 4 units QHS, Novolog 4 units tidwc Current orders for Inpatient glycemic control: Lantus 5 units QHS, Novolog 0-9 units tidwc and hs  HgbA1C - 10.8% PCP - [facility]  Inpatient Diabetes Program Recommendations:     Add Novolog 3 units tidwc for meal coverage insulin if pt eats > 50% meal.  Will need prescription for meter prior to discharge. To speak with pt in am.   Thank you. Lorenda Peck, RD, LDN, CDE Inpatient Diabetes Coordinator [phone]

## 2020-04-21 NOTE — Evaluation (Signed)
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  Patient was unable to get out of the couch over the last 2 days due to weakness and had falls x2 about a week ago   Clinical Impression   Patient presents with overall good strength and ability to perform ADLs and mobility but with some safety concerns and reports of history of falling. Patient will benefit from skilled OT services to improve safety and independence with ADLs and functional mobility in order to return home safely.    Follow Up Recommendations  No OT follow up    Equipment Recommendations       Recommendations for Other Services       Precautions / Restrictions Precautions Precautions: Fall Restrictions Weight Bearing Restrictions: No      Mobility Bed Mobility Overal bed mobility: Needs Assistance Bed Mobility: Supine to Sit     Supine to sit: Supervision;HOB elevated     General bed mobility comments: patient seated in recliner when therapist entered room.  Transfers Overall transfer level: Needs assistance Equipment used: Rolling walker (2 wheeled) Transfers: Sit to/from [facility] Sit to Stand: Min guard Stand pivot transfers: Min guard       General transfer comment: min guard for all ambulation for safety secondary to drowsiness.    Balance Overall balance assessment: No apparent balance deficits (not formally  assessed) Sitting-balance support: Feet supported;No upper extremity supported Sitting balance-Leahy Scale: Good Sitting balance - Comments: seated EOB   Standing balance support: During functional activity;Bilateral upper extremity supported Standing balance-Leahy Scale: Poor Standing balance comment: reliant on RW, appears fearful of falling                           ADL either performed or assessed with clinical judgement   ADL Overall ADL's : Needs assistance/impaired Eating/Feeding: Independent   Grooming: Set up   Upper Body Bathing: Supervision/ safety   Lower Body Bathing: Supervison/ safety   Upper Body Dressing : Supervision/safety   Lower Body Dressing: Supervision/safety Lower Body Dressing Details (indicate cue type and reason): donned socks seated in recliner. Toilet Transfer: Min guard;Ambulation;Regular Toilet;Grab bars Toilet Transfer Details (indicate cue type and reason): Patinet ambulated to bathroom using Rw. verbal cues for safety - to back up to toilet and use grab bar (which she didn't do). Toileting- Clothing Manipulation and Hygiene: Supervision/safety       Functional mobility during ADLs: Min guard General ADL Comments: Min guard for ambulation and verbal cues for safety. Patient drowsy throughout evaluation .     Vision Baseline Vision/History: Wears glasses Patient Visual Report: No change from baseline       Perception     Praxis      Pertinent Vitals/Pain Pain Assessment: No/denies pain     Hand Dominance Right   Extremity/Trunk Assessment Upper Extremity Assessment Upper Extremity Assessment: Overall WFL for tasks assessed   Lower Extremity Assessment Lower Extremity Assessment: Defer to PT evaluation   Cervical / Trunk Assessment Cervical / Trunk Assessment: Normal  Communication Communication Communication: No difficulties   Cognition Arousal/Alertness: (Patient had to be awaken with tactile stimuli. Patient  drowsy througout eval.) Behavior During Therapy: Flat affect Overall Cognitive Status: No family/caregiver present to determine baseline cognitive functioning                                     General Comments       Exercises     Shoulder Instructions      Home Living Family/patient expects to be discharged to:: Private residence Living Arrangements: Spouse/significant other(works nights) Available Help at Discharge: Family;Available PRN/intermittently(pt's sister) Type of Home: Apartment Home Access: Stairs to enter Entrance Stairs-Number of Steps: 1 threshold   Home Layout: One level     Bathroom Shower/Tub: Teacher, early years/pre: Handicapped height     Home Equipment: Environmental consultant - 2 wheels;Cane - single point;Shower seat;Bedside commode      Lives With: Spouse    Prior Functioning/Environment Level of Independence: Needs assistance  Gait / Transfers Assistance Needed: household ambulator with RW/SPC, uses riding cart in stores ADL's / Homemaking Assistance Needed: spouse cooks and cleans; independent with bathing, dressing, laundry   Comments: sister provides transportation        OT Problem List: Decreased safety awareness      OT Treatment/Interventions: Self-care/ADL training;Patient/family education    OT Goals(Current goals can be found in the care plan section) Acute Rehab OT Goals Patient Stated Goal: to ho home at discharge OT Goal Formulation: With patient Time For Goal Achievement: 05/05/20 Potential to Achieve Goals: Good  OT Frequency: Min 2X/week   Barriers to D/C:            Co-evaluation              AM-PAC OT "6 Clicks" Daily Activity     Outcome Measure Help from another person eating meals?: None Help from another person taking care of personal grooming?: A Little Help from another person toileting, which includes using toliet, bedpan, or urinal?: A Little Help from another person bathing (including  washing, rinsing, drying)?: A Little Help from another person to put on and taking off regular upper body clothing?: A Little Help from another person to put on and taking off regular lower body clothing?: A Little 6 Click Score: 19   End of Session Equipment Utilized During Treatment: Gait belt;Rolling walker Nurse Communication: Mobility status  Activity Tolerance: Patient tolerated treatment well Patient left: in chair;with call bell/phone within reach;with chair alarm set  OT Visit Diagnosis: History of falling (Z91.81)                Time: 6761-9509 OT Time Calculation (min): 16 min Charges:  OT General Charges $OT Visit: 1 Visit OT Evaluation $OT Eval Low Complexity: 1 Low  Latreece Mochizuki, OTR/L [place]  Office [place] 04/21/2020, 3:22 PM

## 2020-04-21 NOTE — Evaluation (Signed)
Clinical/Bedside Swallow Evaluation Patient Details  Name: Nancy [name] MRN: [id number] Date of Birth: 1960-12-19  Today's Date: 04/21/2020 Time: SLP Start Time (ACUTE ONLY): 1000 SLP Stop Time (ACUTE ONLY): 1020 SLP Time Calculation (min) (ACUTE ONLY): 20 min  Past Medical History:  Past Medical History:  Diagnosis Date  . Anemia 2006  . Depression 2014   previously on amitryptiline   . Diabetes mellitus with neurological manifestation ([place]) 2006  . Fracture of left ankle 1997   . Gastroparesis 07/2016  . HOH (hard of hearing) 2004   . Hyperlipidemia 2006  . Hypertension 2006  . IBS (irritable bowel syndrome) 2002   . Leukopenia 2015   . Macular degeneration 11/2019  . Shingles 2009   . Stroke ([place]) 2020  . Thyroid nodule 2004   Past Surgical History:  Past Surgical History:  Procedure Laterality Date  . ABDOMINAL HYSTERECTOMY  2005  . CATARACT EXTRACTION Left 11/2019  . CESAREAN SECTION  1983   . CHOLECYSTECTOMY N/A 03/05/2020   Procedure: LAPAROSCOPIC CHOLECYSTECTOMY WITH INTRAOPERATIVE CHOLANGIOGRAM;  Surgeon: Donnie Mesa, MD;  Location: [facility];  Service: General;  Laterality: N/A;  . ESOPHAGOGASTRODUODENOSCOPY (EGD) WITH PROPOFOL Left 08/26/2014   Procedure: ESOPHAGOGASTRODUODENOSCOPY (EGD) WITH PROPOFOL;  Surgeon: Arta Silence, MD;  Location: [facility];  Service: Endoscopy;  Laterality: Left;  . ESOPHAGOGASTRODUODENOSCOPY (EGD) WITH PROPOFOL N/A 03/03/2020   Procedure: ESOPHAGOGASTRODUODENOSCOPY (EGD) WITH PROPOFOL;  Surgeon: Jerene Bears, MD;  Location: [facility];  Service: Gastroenterology;  Laterality: N/A;   HPI:  59yo female admitted 04/20/20 with 2-3 weeks of generalized weakness, n/v, diarrhea. PMH: GERD, poorly controlled DM2, gastroparesis, HTN, HLD, R CVA with residual left weakness (2020), gallstone pancreatitis, macular degeneration. CXR negative.   Assessment / Plan / Recommendation Clinical Impression  Pt presents with adequate oral motor  strength and function. Adequate dentition, unremarkable CN exam. Pt accepted trials of thin liquid, puree, and solid textures. No obvious oral issues or overt s/s aspiration on any consistency. Pt reports no difficulty or history of dysphagia. Recommend continuing current diet. No further ST intervention recommended for dysphagia.   SLP Visit Diagnosis: Dysphagia, unspecified (R13.10)    Aspiration Risk  Mild aspiration risk    Diet Recommendation Regular;Thin liquid   Liquid Administration via: Cup;Straw Medication Administration: Whole meds with liquid Supervision: Patient able to self feed Compensations: Slow rate;Small sips/bites Postural Changes: Seated upright at 90 degrees;Remain upright for at least 30 minutes after po intake    Other  Recommendations Oral Care Recommendations: Oral care BID   Follow up Recommendations None          Prognosis Prognosis for Safe Diet Advancement: Good      Swallow Study   General Date of Onset: 04/20/20 HPI: 59yo female admitted 04/20/20 with 2-3 weeks of generalized weakness, n/v, diarrhea. PMH: GERD, poorly controlled DM2, gastroparesis, HTN, HLD, R CVA with residual left weakness (2020), gallstone pancreatitis, macular degeneration. CXR negative. Type of Study: Bedside Swallow Evaluation Previous Swallow Assessment: none found Diet Prior to this Study: Regular;Thin liquids Temperature Spikes Noted: No Respiratory Status: Room air History of Recent Intubation: No Behavior/Cognition: Alert;Cooperative;Pleasant mood Oral Cavity Assessment: Within Functional Limits Oral Care Completed by SLP: No Oral Cavity - Dentition: Adequate natural dentition Vision: Functional for self-feeding Self-Feeding Abilities: Able to feed self Patient Positioning: Upright in chair Baseline Vocal Quality: Normal Volitional Cough: Strong Volitional Swallow: Able to elicit    Oral/Motor/Sensory Function Overall Oral Motor/Sensory Function: Within functional  limits   Ice Chips  Ice chips: Not tested   Thin Liquid Thin Liquid: Within functional limits Presentation: Spoon;Self Fed    Nectar Thick Nectar Thick Liquid: Not tested   Honey Thick Honey Thick Liquid: Not tested   Puree Puree: Within functional limits Presentation: Spoon;Self Fed   Solid     Solid: Within functional limits Presentation: [place] B. Quentin Ore, [facility], [place] Speech Language Pathologist Office: [phone] Pager: [phone]  Shonna Chock 04/21/2020,11:03 AM

## 2020-04-21 NOTE — Evaluation (Signed)
Speech Language Pathology Evaluation Patient Details Name: Nancy [name] MRN: [id number] DOB: 05/12/1961 Today's Date: 04/21/2020 Time: 1020-1040 SLP Time Calculation (min) (ACUTE ONLY): 20 min  Problem List:  Patient Active Problem List   Diagnosis Date Noted  . ARF (acute renal failure) ([place]) 04/20/2020  . Generalized weakness 04/20/2020  . Dehydration 04/20/2020  . Generalized abdominal pain   . Pancreatitis, acute 02/29/2020  . Cerebrovascular accident (CVA) ([place]) 11/08/2019  . Stenosis of right carotid artery 11/08/2019  . Hyperlipidemia associated with type 2 diabetes mellitus ([place]) 11/08/2019  . Cortical age-related cataract of both eyes 08/30/2019  . Gait abnormality 08/30/2019  . Statin declined 08/30/2019  . Gastroesophageal reflux disease without esophagitis 04/18/2018  . 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 Uncontrolled type 2 diabetes mellitus with diabetic neuropathy, with long-term current use of insulin ([place]) 10/29/2017  . History of macular degeneration 10/29/2017  . Chronic cervical pain 10/29/2016  . Myalgia 09/14/2016  . Insomnia 09/14/2016  . Tinea pedis of both feet 08/20/2016  . Macular degeneration 08/17/2016  . Low back pain 09/21/2014  . Hypertension 09/21/2014  . Diabetic gastroparesis associated with type 2 diabetes mellitus ([place]) 09/14/2014  . Thyroid nodule 08/17/2014  . Obesity (BMI 30-39.9) 08/17/2014  . Hyperlipidemia 08/05/2014  . Nausea & vomiting 08/04/2014  . Type II diabetes mellitus with neurological manifestations, uncontrolled ([place]) 08/04/2014  . Elevated serum protein level 08/04/2014   Past Medical History:  Past Medical History:  Diagnosis Date  . Anemia 2006  . Depression 2014   previously on amitryptiline   . Diabetes mellitus with neurological manifestation ([place]) 2006  . Fracture of left ankle 1997   . Gastroparesis 07/2016  . HOH (hard of hearing) 2004   . Hyperlipidemia 2006  . Hypertension 2006  . IBS (irritable bowel  syndrome) 2002   . Leukopenia 2015   . Macular degeneration 11/2019  . Shingles 2009   . Stroke ([place]) 2020  . Thyroid nodule 2004   Past Surgical History:  Past Surgical History:  Procedure Laterality Date  . ABDOMINAL HYSTERECTOMY  2005  . CATARACT EXTRACTION Left 11/2019  . CESAREAN SECTION  1983   . CHOLECYSTECTOMY N/A 03/05/2020   Procedure: LAPAROSCOPIC CHOLECYSTECTOMY WITH INTRAOPERATIVE CHOLANGIOGRAM;  Surgeon: Donnie Mesa, MD;  Location: [facility];  Service: General;  Laterality: N/A;  . ESOPHAGOGASTRODUODENOSCOPY (EGD) WITH PROPOFOL Left 08/26/2014   Procedure: ESOPHAGOGASTRODUODENOSCOPY (EGD) WITH PROPOFOL;  Surgeon: Arta Silence, MD;  Location: [facility];  Service: Endoscopy;  Laterality: Left;  . ESOPHAGOGASTRODUODENOSCOPY (EGD) WITH PROPOFOL N/A 03/03/2020   Procedure: ESOPHAGOGASTRODUODENOSCOPY (EGD) WITH PROPOFOL;  Surgeon: Jerene Bears, MD;  Location[facility];  Service: Gastroenterology;  Laterality: N/A;   HPI:  59yo female admitted 04/20/20 with 2-3 weeks of generalized weakness, n/v, diarrhea. PMH: GERD, poorly controlled DM2, gastroparesis, HTN, HLD, R CVA with residual left weakness (2020), gallstone pancreatitis, macular degeneration. CXR negative.   Assessment / Plan / Recommendation Clinical Impression  [facility] Mental Status (SLUMS) Examination was administered. Pt scored 24/30, indicating mild neuro-cognitive deficits. Points were lost on digit reversal, delayed recall, and auditory attention/recall. Pt indicates decreased hearing acuity, which is baseline. She also indicates residual deficits from 09/2019 CVA, including left side weakness, and some difficulty with thinking skills. Pt indicates she did not receive speech therapy following her stroke. Recommend consideration of home health or outpatient speech therapy if pt/family feel this would be beneficial to maximize pt independence and reduce caregiver burden. No further acute ST needs at  this time. Signing off.    SLP Assessment  SLP Recommendation/Assessment: All further Speech Language Pathology needs can be addressed in the next venue of care  SLP Visit Diagnosis: Cognitive communication deficit (R41.841)    Follow Up Recommendations  Home health SLP;Outpatient SLP(if needs arise)       SLP Evaluation Cognition  Overall Cognitive Status: No family/caregiver present to determine baseline cognitive functioning Arousal/Alertness: Awake/alert Orientation Level: Oriented X4 Attention: Focused;Sustained;Selective Focused Attention: Appears intact Sustained Attention: Appears intact Selective Attention: Appears intact Memory: Impaired Memory Impairment: Retrieval deficit;Decreased short term memory Decreased Short Term Memory: Verbal basic       Comprehension  Auditory Comprehension Overall Auditory Comprehension: Appears within functional limits for tasks assessed Interfering Components: Hearing    Expression Expression Primary Mode of Expression: Verbal Verbal Expression Overall Verbal Expression: Appears within functional limits for tasks assessed Written Expression Dominant Hand: Right   Oral / Motor  Oral Motor/Sensory Function Overall Oral Motor/Sensory Function: Within functional limits Motor Speech Overall Motor Speech: Appears within functional limits for tasks assessed   GO                   Nancy [name] B. Nancy [name], [facility], [place] Speech Language Pathologist Office: [phone] Pager: [phone]  Nancy [name] 04/21/2020, 11:11 AM

## 2020-04-22 ENCOUNTER — Ambulatory Visit: Payer: 59 | Admitting: Internal Medicine

## 2020-04-22 DIAGNOSIS — N178 Other acute kidney failure: Secondary | ICD-10-CM

## 2020-04-22 LAB — BASIC METABOLIC PANEL
Anion gap: 7 (ref 5–15)
BUN: 28 mg/dL — ABNORMAL HIGH (ref 6–20)
CO2: 23 mmol/L (ref 22–32)
Calcium: 8.5 mg/dL — ABNORMAL LOW (ref 8.9–10.3)
Chloride: 108 mmol/L (ref 98–111)
Creatinine, Ser: 1.24 mg/dL — ABNORMAL HIGH (ref 0.44–1.00)
GFR calc Af Amer: 55 mL/min — ABNORMAL LOW (ref >60)
GFR calc non Af Amer: 47 mL/min — ABNORMAL LOW (ref >60)
Glucose, Bld: 189 mg/dL — ABNORMAL HIGH (ref 70–99)
Potassium: 4.2 mmol/L (ref 3.5–5.1)
Sodium: 138 mmol/L (ref 135–145)

## 2020-04-22 LAB — GLUCOSE, CAPILLARY: Glucose-Capillary: 141 mg/dL — ABNORMAL HIGH (ref 70–99)

## 2020-04-22 LAB — CBC
HCT: 31.6 % — ABNORMAL LOW (ref 36.0–46.0)
Hemoglobin: 9.9 g/dL — ABNORMAL LOW (ref 12.0–15.0)
MCH: 29.1 pg (ref 26.0–34.0)
MCHC: 31.3 g/dL (ref 30.0–36.0)
MCV: 92.9 fL (ref 80.0–100.0)
Platelets: 229 10*3/uL (ref 150–400)
RBC: 3.4 MIL/uL — ABNORMAL LOW (ref 3.87–5.11)
RDW: 12.4 % (ref 11.5–15.5)
WBC: 10.2 10*3/uL (ref 4.0–10.5)
nRBC: 0 % (ref 0.0–0.2)

## 2020-04-22 MED ORDER — AMLODIPINE BESYLATE 10 MG PO TABS: 10.0000 mg | ORAL_TABLET | Freq: Every day | ORAL | 3 refills | Status: DC

## 2020-04-22 MED ORDER — LISINOPRIL 40 MG PO TABS: 40.0000 mg | ORAL_TABLET | Freq: Every day | ORAL | Status: DC

## 2020-04-22 MED ORDER — ONDANSETRON HCL 4 MG PO TABS: 4.0000 mg | ORAL_TABLET | Freq: Three times a day (TID) | ORAL | 0 refills | Status: DC | PRN

## 2020-04-22 MED ORDER — INSULIN GLARGINE 100 UNIT/ML SOLOSTAR PEN
7.0000 [IU] | PEN_INJECTOR | Freq: Every day | SUBCUTANEOUS | 2 refills | Status: DC
Start: 2020-04-22 — End: 2020-05-06

## 2020-04-22 MED ORDER — INSULIN ASPART 100 UNIT/ML ~~LOC~~ SOLN
3.0000 [IU] | Freq: Three times a day (TID) | SUBCUTANEOUS | Status: DC
  Administered 2020-04-22: 3 [IU] via SUBCUTANEOUS

## 2020-04-22 MED ORDER — OMEPRAZOLE 20 MG PO CPDR: 20.0000 mg | DELAYED_RELEASE_CAPSULE | Freq: Every day | ORAL | 1 refills | Status: DC

## 2020-04-22 MED ORDER — METOPROLOL TARTRATE 25 MG PO TABS: 25.0000 mg | ORAL_TABLET | Freq: Two times a day (BID) | ORAL | 3 refills | Status: DC

## 2020-04-22 MED ORDER — METOCLOPRAMIDE HCL 5 MG PO TABS
5.0000 mg | ORAL_TABLET | Freq: Three times a day (TID) | ORAL | 1 refills | Status: DC
Start: 2020-04-22 — End: 2021-03-07

## 2020-04-22 MED ORDER — DIPHENHYDRAMINE HCL 25 MG PO CAPS
25.0000 mg | ORAL_CAPSULE | Freq: Three times a day (TID) | ORAL | Status: DC | PRN
  Administered 2020-04-22: 25 mg via ORAL
  Filled 2020-04-22: qty 1

## 2020-04-22 MED FILL — METOCLOPRAMIDE 5 MG TABLET: 5 | 30 days supply | Qty: 90 | Fill #0

## 2020-04-22 MED FILL — risperiDONE 2 MG TABS: 2 | 30 days supply | Qty: 30 | Fill #0

## 2020-04-22 MED FILL — METOPROLOL TARTRATE 25 MG T: 25 | 30 days supply | Qty: 120 | Fill #0

## 2020-04-22 MED FILL — AMLODIPINE BESYLATE 10 MG T: 10 | 30 days supply | Qty: 30 | Fill #0

## 2020-04-22 MED FILL — OMEPRAZOLE 20 MG CAP: 20 | 30 days supply | Qty: 30 | Fill #0

## 2020-04-22 MED FILL — LANTUS SOLOSTAR 100 UNITS/M: 100 | 30 days supply | Qty: 3 | Fill #0

## 2020-04-22 MED FILL — ONDANSETRON HCL 4 MG TABLET: 4 | 10 days supply | Qty: 30 | Fill #0

## 2020-04-22 NOTE — Progress Notes (Signed)
Patient requesting Benadryl for itching. Paged floor coverage.

## 2020-04-22 NOTE — Discharge Summary (Signed)
Physician Discharge Summary   Patient ID: Nancy [name] MRN: [id number] DOB/AGE: Aug 18, 1961 59 y.o.  Admit date: 04/20/2020 Discharge date: 04/22/2020  Primary Care Physician:  [name], MD   Recommendations for Outpatient Follow-up:  1. Follow up with PCP in 1-2 weeks 2. Please obtain BMP in one week  3. HCTZ discontinued, holding lisinopril until follow-up with PCP and then may be restarted at lower dose. 4. Restarted metoprolol 25 mg twice daily  Home Health: HHPT Equipment/Devices: 3n1  Discharge Condition: stable  CODE STATUS: FULL  Diet recommendation: Heart healthy diet   Discharge Diagnoses:    . Acute kidney injury . Dehydration    . Type II diabetes mellitus with neurological manifestations, uncontrolled ([place]) . Hyperlipidemia . Diabetic gastroparesis associated with type 2 diabetes mellitus ([place]) . Hypertension . Gastroesophageal reflux disease without esophagitis . Cerebrovascular accident (CVA) ([place]) . Hyperlipidemia associated with type 2 diabetes mellitus ([place])    Consults:   None    Allergies:   Allergies  Allergen Reactions  . Hydralazine Hcl     Hair loss  . Hydrocodone Other (See Comments)    Upset stomach, itching  . Metformin And Related Nausea And Vomiting and Other (See Comments)    Stomach pains   . Plaquenil [Hydroxychloroquine Sulfate] Hives  . Sulfur Hives     DISCHARGE MEDICATIONS: Allergies as of 04/22/2020      Reactions   Hydralazine Hcl    Hair loss   Hydrocodone Other (See Comments)   Upset stomach, itching   Metformin And Related Nausea And Vomiting, Other (See Comments)   Stomach pains    Plaquenil [hydroxychloroquine Sulfate] Hives   Sulfur Hives      Medication List    STOP taking these medications   hydrochlorothiazide 12.5 MG capsule Commonly known as: MICROZIDE   lisinopril 40 MG tablet Commonly known as: ZESTRIL     TAKE these medications   acetaminophen 500 MG tablet Commonly known  as: TYLENOL Take 500 mg by mouth every 6 (six) hours as needed for moderate pain or headache.   amLODipine 10 MG tablet Commonly known as: NORVASC Take 1 tablet (10 mg total) by mouth daily.   aspirin EC 81 MG tablet Take 1 tablet (81 mg total) by mouth daily.   atorvastatin 20 MG tablet Commonly known as: LIPITOR Take 1 tablet (20 mg total) by mouth daily.   cyclobenzaprine 5 MG tablet Commonly known as: FLEXERIL Take 5 mg by mouth daily as needed for muscle spasms.   DULoxetine 20 MG capsule Commonly known as: CYMBALTA TAKE 1 CAPSULE (20 MG TOTAL) BY MOUTH DAILY.   ezetimibe 10 MG tablet Commonly known as: ZETIA Take 10 mg by mouth daily.   gabapentin 300 MG capsule Commonly known as: NEURONTIN Take 1 capsule (300 mg total) by mouth at bedtime.   insulin aspart 100 UNIT/ML FlexPen Commonly known as: NOVOLOG Inject 4 Units into the skin 3 (three) times daily with meals.   insulin glargine 100 UNIT/ML Solostar Pen Commonly known as: LANTUS Inject 7 Units into the skin at bedtime. What changed: how much to take   metoCLOPramide 5 MG tablet Commonly known as: Reglan Take 1 tablet (5 mg total) by mouth 3 (three) times daily before meals.   metoprolol tartrate 25 MG tablet Commonly known as: LOPRESSOR Take 1 tablet (25 mg total) by mouth 2 (two) times daily.   omeprazole 20 MG capsule Commonly known as: PRILOSEC Take 1 capsule (20 mg total) by mouth  daily.   ondansetron 4 MG tablet Commonly known as: ZOFRAN Take 1 tablet (4 mg total) by mouth every 8 (eight) hours as needed for nausea or vomiting.   Pen Needles 32G X 4 MM Misc 1 each by Does not apply route 4 (four) times daily.   risperiDONE 2 MG tablet Commonly known as: RISPERDAL Take 1 tablet (2 mg total) by mouth at bedtime.   senna-docusate 8.6-50 MG tablet Commonly known as: Senokot S Take 1 tablet by mouth daily as needed for moderate constipation.   SYSTANE COMPLETE OP Apply 1 drop to eye daily  as needed (dry eyes).   traZODone 50 MG tablet Commonly known as: DESYREL Take 50 mg by mouth at bedtime.   True Metrix Meter w/Device Kit Use as directed            Durable Medical Equipment  (From admission, onward)         Start     Ordered   04/22/20 0610  For home use only DME 3 n 1  Once     04/22/20 0609           Brief H and P: For complete details please refer to admission H and P, but in briefPatient is a 59 year old female with poorly controlled diabetes mellitus type 2 with gastroparesis, no hypertension, hyperlipidemia, prior history of CVA with some residual left-sided weakness, recent hospitalization for gallstone pancreatitis status post cholecystectomy in March 2021 presented with 2 to 3-week history of worsening generalized weakness, fatigue, nausea and emesis, 3-day history of diarrhea which had resolved week ago prior to admission.  Patient was unable to get out of the couch over the last 2 days due to weakness and had falls x2 about a week ago  In ED, creatinine 2.38, lipase 17, magnesium 1.9, UA negative for UTI.  Patient was admitted for further work-up, acute kidney injury   Hospital Course:   Acute kidney injury ([place]) -Likely prerenal azotemia secondary to intractable nausea vomiting, diarrhea, poor p.o. intake, medications including lisinopril, HCTZ -Lisinopril and HCTZ were held.  Creatinine was 2.3 at the time of admission, trended up to 2.4, baseline 0.8.  -Patient was placed on aggressive IV fluid hydration - Renal ultrasound was normal, no hydronephrosis or obstructive uropathy. -  UA with negative proteinuria, no UTI or ketones. -At the time of discharge, creatinine has normalized 1.2, tolerating diet without any difficulty -BP stable, patient may resume lisinopril outpatient, however hold off on HCTZ due to dehydration, diarrhea and hypovolemia.  Will need to follow-up with PCP and restart if BP running elevated and patient is doing  well.      Nausea & vomiting underlying history of gastroparesis, likely gastroparesis flare -Currently improving, no active nausea or vomiting - lipase within normal limits, no transaminitis -KUB consistent with enteritis -Patient had not been taking Reglan, will discharge on Reglan, outpatient follow-up with PCP  Dehydration -Patient was placed on aggressive IV fluid hydration, creatinine now normalized, tolerating diet    Type II diabetes mellitus with neurological manifestations, uncontrolled ([place]), with gastroparesis -Last hemoglobin A1c 10.8 in 02/2020 -Increase Lantus to 7 units at bedtime, continue NovoLog 3 units 3 times daily and follow-up with PCP    Hyperlipidemia -Continue Zetia    Diabetic gastroparesis associated with type 2 diabetes mellitus ([facility]) -Continue Reglan  GERD -Continue PPI  History of nonhemorrhagic CVA Continue aspirin for secondary stroke prophylaxis, resume statin, Zetia - PT OT evaluation done, recommended home health PT  Hypertension  Resumed Norvasc, metoprolol, hold off on HCTZ, ACE inhibitor due to dehydration, acute kidney injury  Obesity Estimated body mass index is 35.78 kg/m as calculated from the following:   Height as of 02/29/20: '5\' 3"'$  (1.6 m).   Weight as of 02/29/20: 91.6 kg.  Day of Discharge S: Doing well, tolerating diet.  Hoping to go home today  BP 132/71 (BP Location: Right Arm)   Pulse 72   Temp 97.7 F (36.5 C) (Oral)   Resp 16   Ht '5\' 3"'$  (1.6 m)   Wt 93.4 kg   SpO2 97%   BMI 36.49 kg/m   Physical Exam: General: Alert and awake oriented x3 not in any acute distress. HEENT: anicteric sclera, pupils reactive to light and accommodation CVS: S1-S2 clear no murmur rubs or gallops Chest: clear to auscultation bilaterally, no wheezing rales or rhonchi Abdomen: soft nontender, nondistended, normal bowel sounds Extremities: no cyanosis, clubbing or edema noted bilaterally Neuro: Cranial nerves II-XII intact,  no focal neurological deficits    Get Medicines reviewed and adjusted: Please take all your medications with you for your next visit with your Primary MD  Please request your Primary MD to go over all hospital tests and procedure/radiological results at the follow up. Please ask your Primary MD to get all Hospital records sent to his/her office.  If you experience worsening of your admission symptoms, develop shortness of breath, life threatening emergency, suicidal or homicidal thoughts you must seek medical attention immediately by calling 911 or calling your MD immediately  if symptoms less severe.  You must read complete instructions/literature along with all the possible adverse reactions/side effects for all the Medicines you take and that have been prescribed to you. Take any new Medicines after you have completely understood and accept all the possible adverse reactions/side effects.   Do not drive when taking pain medications.   Do not take more than prescribed Pain, Sleep and Anxiety Medications  Special Instructions: If you have smoked or chewed Tobacco  in the last 2 yrs please stop smoking, stop any regular Alcohol  and or any Recreational drug use.  Wear Seat belts while driving.  Please note  You were cared for by a hospitalist during your hospital stay. Once you are discharged, your primary care physician will handle any further medical issues. Please note that NO REFILLS for any discharge medications will be authorized once you are discharged, as it is imperative that you return to your primary care physician (or establish a relationship with a primary care physician if you do not have one) for your aftercare needs so that they can reassess your need for medications and monitor your lab values.   The results of significant diagnostics from this hospitalization (including imaging, microbiology, ancillary and laboratory) are listed below for reference.       Procedures/Studies:  DG Chest 2 View  Result Date: 04/20/2020 CLINICAL DATA:  Generalized weakness, fatigue, nausea, vomiting EXAM: CHEST - 2 VIEW COMPARISON:  03/01/2020 FINDINGS: Frontal and lateral views of the chest demonstrate a stable cardiac silhouette. No airspace disease, effusion, or pneumothorax. No acute bony abnormalities. IMPRESSION: 1. Stable chest, no acute process. Electronically Signed   By: Randa Ngo M.D.   On: 04/20/2020 17:25   US RENAL  Result Date: 04/21/2020 CLINICAL DATA:  Acute kidney injury.  Hypertension and diabetes. EXAM: RENAL / URINARY TRACT ULTRASOUND COMPLETE COMPARISON:  None. FINDINGS: Right Kidney: Renal measurements: 12.2 x 5.3 x 5.5 cm = volume: 185.9 mL .  Echogenicity within normal limits. No mass or hydronephrosis visualized. Left Kidney: Renal measurements: 12.6 x 5.1 x 4.8 cm = volume: 161 mL. Echogenicity within normal limits. No mass or hydronephrosis visualized. Bladder: Appears normal for degree of bladder distention. Other: None. IMPRESSION: 1. Normal renal sonogram. Electronically Signed   By: Kerby Moors M.D.   On: 04/21/2020 08:47   DG Abd 2 Views  Result Date: 04/20/2020 CLINICAL DATA:  Nausea and vomiting EXAM: ABDOMEN - 2 VIEW COMPARISON:  03/07/2020 FINDINGS: Surgical clips in the right upper quadrant. Moderate stool in the colon. No definite dilated small bowel. Scattered fluid levels within the central and right lower quadrant of the abdomen. IMPRESSION: Scattered fluid levels over the central and right lower quadrant of the abdomen, possible enteritis. If bowel obstruction is a concern, CT could be obtained for further evaluation. Electronically Signed   By: Donavan Foil M.D.   On: 04/20/2020 23:17      LAB RESULTS: Basic Metabolic Panel: Recent Labs  Lab 04/21/20 0037 04/21/20 0037 04/21/20 1338 04/22/20 0449  NA 136   < > 137 138  K 4.5   < > 4.1 4.2  CL 101   < > 103 108  CO2 22   < > 24 23  GLUCOSE 191*   < >  244* 189*  BUN 27*   < > 31* 28*  CREATININE 2.42*   < > 1.89* 1.24*  CALCIUM 9.0   < > 8.7* 8.5*  MG 1.8  --   --   --    < > = values in this interval not displayed.   Liver Function Tests: Recent Labs  Lab 04/20/20 1651 04/21/20 0037  AST 17 16  ALT 15 15  ALKPHOS 61 58  BILITOT 0.9 0.7  PROT 8.0 7.5  ALBUMIN 4.0 3.7   Recent Labs  Lab 04/20/20 1651  LIPASE 17   No results for input(s): AMMONIA in the last 168 hours. CBC: Recent Labs  Lab 04/21/20 0201 04/21/20 0201 04/22/20 0449  WBC 14.2*  --  10.2  NEUTROABS 8.2*  --   --   HGB 10.9*  --  9.9*  HCT 35.6*  --  31.6*  MCV 92.5   < > 92.9  PLT 247  --  229   < > = values in this interval not displayed.   Cardiac Enzymes: Recent Labs  Lab 04/20/20 1651  CKTOTAL 68   BNP: Invalid input(s): POCBNP CBG: Recent Labs  Lab 04/21/20 2148 04/22/20 0724  GLUCAP 165* 141*       Disposition and Follow-up: Discharge Instructions    Diet Carb Modified   Complete by: As directed    Discharge instructions   Complete by: As directed    It is VERY IMPORTANT that you follow up with a PCP on a regular basis.  Check your blood glucoses before each meal and at bedtime and maintain a log of your readings.  Bring this log with you when you follow up with your PCP so that he or she can adjust your insulin at your follow up visit.   Increase activity slowly   Complete by: As directed        DISPOSITION: [place]    [name], MD. Schedule an appointment as soon as possible for a visit in 2 week(s).   Specialty: Internal Medicine Why: please check BMET before starting lisinopril Contact information: [address] [place] [address]  [phone]            Time coordinating discharge:  35 minutes  Signed:   Estill Cotta M.D. [facility] 04/22/2020, 11:32 AM

## 2020-04-22 NOTE — TOC Transition Note (Signed)
Transition of Care [facility]) - CM/SW Discharge Note   Patient Details  Name: Nancy [name] MRN: [id number] Date of Birth: [birth year]  Transition of Care [facility] CM/SW Contact:  Lynnell Catalan, RN Phone Number: 04/22/2020, 3:20 PM   Clinical Narrative:    Pt from home with husband. To go back there with his 24hr supervision. Pt states she has RW and 3in1 at home currently. This CM unable to obtain home health services due to pt insurance. Pt states she can do outpatient physical therapy once she is settled at home.   Final next level of care: Home/Self Care Barriers to Discharge: No Barriers Identified      Readmission Risk Interventions Readmission Risk Prevention Plan 03/07/2020  Transportation Screening Complete  PCP or Specialist Appt within 3-5 Days Complete  HRI or [place] (No Data)  Social Work Consult for [place] Planning/Counseling [place] Not Applicable  Medication Review [employer]) Complete  Some recent data might be hidden

## 2020-04-25 ENCOUNTER — Telehealth: Payer: Self-pay

## 2020-04-25 NOTE — Telephone Encounter (Signed)
Transition Care Management Follow-up Telephone Call  Date of discharge and from where: 04/22/2020, [place] [facility]   How have you been since you were released from the hospital? She said she is doing okay  Any questions or concerns? She explained that she has been experiencing an increase in urination for a couple of weeks.  She said she has to use the bathroom about every 30 minutes. Urine is clear, no pain, burning with urination. She had this issue when she was hospitalized and was told that her urine was negative for UTI.informed her that Dr Wynetta Emery would be notified of her concern. Items Reviewed:  Did the pt receive and understand the discharge instructions provided?yes  Medications obtained and verified?   she said she has all medications and did not have any questions.  There is a change noted for her lantus on the AVS instructing her to take 7 units at bedtime.   She said she has been taking 55 units, not 7 units. This dose of 55 units was clarified a couple of times The AVS also instructs her to stop taking the hydrochlorothiazide and lisinopril.  She said that she thought she was only to stop taking it for one day, so she is currently taking those medications.  Informed her that this CM would notify Dr Wynetta Emery of these discrepancies with the lantus and hydrochlorothiazide and lisinopril.  - this information was shared with DrJohnson    After the call was completed this CM spoke to Dr Wynetta Emery who  recommended that that patient continue with lantus 55 units daily at bedtime and continue to monitor her blood sugars. She should hold the hydrochlorothiazide and lisinopril.  The urinary frequency may be due to her diabetes.  This CM placed another call to the patient # [phone] to inform her of Dr Durenda Age orders. Message left with call back requested to this CM.   Any new allergies since your discharge?  none reported   Do you have support at home?  her husband  Other (ie: DME, Home  Health, etc) they were unable to arrange home health at the time of discharge.   She has a RW, cane, shower seat and 3:1 commode.   Has  Glucometer.  Blood sugar 178 this morning . She said she checks her blood sugars three times daily and keeps a log of the results.   Functional Questionnaire: (I = Independent and D = Dependent) ADL's: independent   Follow up appointments reviewed:    PCP Hospital f/u appt confirmed? appointment with Dr Wynetta Emery 05/06/2020 @ [facility] f/u appt confirmed? .neurology testing tomorrow - 04/26/2020  Are transportation arrangements needed?  no  If their condition worsens, is the pt aware to call  their PCP or go to the ED?  yes  Was the patient provided with contact information for the PCP's office or ED?  she has the clinic phone number  Was the pt encouraged to call back with questions or concerns?   yes

## 2020-04-25 NOTE — Telephone Encounter (Signed)
PA APPROVED THRU 04/22/2021 FOR NOVOLOG

## 2020-04-26 ENCOUNTER — Encounter: Payer: Self-pay | Admitting: Neurology

## 2020-04-26 ENCOUNTER — Ambulatory Visit (INDEPENDENT_AMBULATORY_CARE_PROVIDER_SITE_OTHER): Payer: 59 | Admitting: Neurology

## 2020-04-26 ENCOUNTER — Other Ambulatory Visit: Payer: Self-pay

## 2020-04-26 ENCOUNTER — Telehealth: Payer: Self-pay

## 2020-04-26 DIAGNOSIS — E1165 Type 2 diabetes mellitus with hyperglycemia: Secondary | ICD-10-CM

## 2020-04-26 DIAGNOSIS — G3281 Cerebellar ataxia in diseases classified elsewhere: Secondary | ICD-10-CM

## 2020-04-26 DIAGNOSIS — R269 Unspecified abnormalities of gait and mobility: Secondary | ICD-10-CM

## 2020-04-26 DIAGNOSIS — E1140 Type 2 diabetes mellitus with diabetic neuropathy, unspecified: Secondary | ICD-10-CM

## 2020-04-26 DIAGNOSIS — E1142 Type 2 diabetes mellitus with diabetic polyneuropathy: Secondary | ICD-10-CM | POA: Diagnosis not present

## 2020-04-26 DIAGNOSIS — IMO0002 Reserved for concepts with insufficient information to code with codable children: Secondary | ICD-10-CM

## 2020-04-26 DIAGNOSIS — Z794 Long term (current) use of insulin: Secondary | ICD-10-CM

## 2020-04-26 HISTORY — DX: Type 2 diabetes mellitus with diabetic polyneuropathy: E11.42

## 2020-04-26 NOTE — Procedures (Signed)
     HISTORY:  Nancy [name] is a 59 year old patient with history of diabetes who has had some problems with low back pain.  She recently had a watershed type stroke with evidence of right carotid artery occlusion.  She reports episodic falls associated with collapse of the left leg.  She is being evaluated for a possible neuropathy or a radiculopathy.  NERVE CONDUCTION STUDIES:  Nerve conduction studies were performed on both lower extremities.  The distal motor latencies for the peroneal nerves bilaterally and for the left posterior tibial nerve were normal, no response was seen for the right posterior tibial nerve.  The motor amplitudes for the peroneal nerves bilaterally and for the left posterior tibial nerve were low with slowing seen for the right peroneal nerve and for the left posterior tibial nerve, no response for the right posterior tibial nerve or left peroneal nerve.  The sural and peroneal sensory latencies were unobtainable.  The left posterior tibial F-wave latency was prolonged.  EMG STUDIES:  EMG study was performed on the left lower extremity:  The tibialis anterior muscle reveals 2 to 4K motor units with full recruitment. No fibrillations or positive waves were seen. The peroneus tertius muscle reveals 2 to 4K motor units with full recruitment. No fibrillations or positive waves were seen. The medial gastrocnemius muscle reveals 1 to 3K motor units with full recruitment. No fibrillations or positive waves were seen. The vastus lateralis muscle reveals 2 to 4K motor units with full recruitment. No fibrillations or positive waves were seen. The iliopsoas muscle reveals 2 to 4K motor units with full recruitment. No fibrillations or positive waves were seen. The biceps femoris muscle (long head) reveals 2 to 4K motor units with full recruitment. No fibrillations or positive waves were seen. The lumbosacral paraspinal muscles were tested at 3 levels, and revealed 1+ positive  waves at all 3 levels tested. There was good relaxation.   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  EMG evaluation of the left lower extremity was relatively unremarkable surprisingly, yet mild acute denervation was seen in all 3 levels of the lumbosacral paraspinal muscles suggestive of an overlying lumbosacral radiculopathy of indeterminate level.  Jill Alexanders MD 04/26/2020 4:06 PM  [facility] [address] [place], [place] [address]  Phone [phone] Fax [phone]

## 2020-04-26 NOTE — Telephone Encounter (Signed)
Call placed to patient # [phone] to inform her that Dr Wynetta Emery recommends that she  continue with lantus 55 units daily at bedtime and continue to monitor her blood sugars. She should hold the hydrochlorothiazide and lisinopril.  The urinary frequency may be due to her diabetes.  Message left with call back requested to this CM.

## 2020-04-26 NOTE — Progress Notes (Addendum)
The patient comes in for EMG nerve conduction study evaluation.  She has evidence of a peripheral neuropathy, likely associated with diabetes.  This may add to the gait instability problems.  The patient reports ongoing falls, and since last seen she has been in the hospital twice, once for a gallbladder resection and then the second time on 5 May she had acute renal failure.  With these illnesses, she has gotten somewhat weaker and has fallen more.  She was in rehab for 3 weeks after the gallbladder resection but this did not help her gait instability.  The patient reports that she feels woozy when she stands up and that her legs may collapse.  EMG evaluation of the left leg is relatively unremarkable but the patient does have some mild acute denervation at all levels of the lumbosacral paraspinal muscles, there may potentially be a lumbar radiculopathy of indeterminate level.  The patient has not yet had her MRI of the brain.  We will try to get this done.  I will get some physical therapy done as an outpatient, she claims that her insurance will not cover home health physical therapy.          [place]    Nerve / Sites Muscle Latency Ref. Amplitude Ref. Rel Amp Segments Distance Velocity Ref. Area    ms ms mV mV %  cm m/s m/s mVms  R Peroneal - EDB     Ankle EDB 6.5 ?6.5 0.7 ?2.0 100 Ankle - EDB 9   1.7     Fib head EDB 15.6  0.1  17.9 Fib head - Ankle 27 30 ?44 0.2     Pop fossa EDB 18.4  0.4  318 Pop fossa - Fib head 10 36 ?44 0.7         Pop fossa - Ankle      L Peroneal - EDB     Ankle EDB 5.3 ?6.5 0.2 ?2.0 100 Ankle - EDB 9   1.1     Fib head EDB NR  NR  NR Fib head - Ankle 28 NR ?44 NR     Pop fossa EDB 17.5  0.1   Pop fossa - Fib head 10 NR ?44 0.5         Pop fossa - Ankle      R Tibial - AH     Ankle AH NR ?5.8 NR ?4.0 NR Ankle - AH 9   NR  L Tibial - AH     Ankle AH 5.8 ?5.8 0.3 ?4.0 100 Ankle - AH 9   1.0     Pop fossa AH 18.1  0.1  20.1 Pop fossa - Ankle 38 31 ?41 0.2           SNC    Nerve / Sites Rec. Site Peak Lat Ref.  Amp Ref. Segments Distance    ms ms V V  cm  R Sural - Ankle (Calf)     Calf Ankle NR ?4.4 NR ?6 Calf - Ankle 14  L Sural - Ankle (Calf)     Calf Ankle NR ?4.4 NR ?6 Calf - Ankle 14  R Superficial peroneal - Ankle     Lat leg Ankle NR ?4.4 NR ?6 Lat leg - Ankle 14  L Superficial peroneal - Ankle     Lat leg Ankle NR ?4.4 NR ?6 Lat leg - Ankle 14             F  Wave  Nerve F Lat Ref.   ms ms  L Tibial - AH 72.4 ?56.0

## 2020-04-26 NOTE — Progress Notes (Signed)
Please refer to EMG and nerve conduction procedure note.  

## 2020-04-27 NOTE — Telephone Encounter (Signed)
x2 LVM for pt to call back  [employer] update Auth: [id number] (exp. 04/27/20 to 06/11/20) E

## 2020-04-27 NOTE — Telephone Encounter (Signed)
Call received from patient.  Informed her that  Dr Wynetta Emery recommends that she continue with lantus 55 units daily at bedtime and continue to monitor her blood sugars.  Also informed her that She should hold the hydrochlorothiazide and lisinopril. The urinary frequency may be due to her diabetes. She said that she understood and did not have any questions.  She noted that her blood sugar last night was 178 and this morning prior to breakfast it was 218.

## 2020-05-03 ENCOUNTER — Telehealth: Payer: Self-pay | Admitting: Neurology

## 2020-05-03 NOTE — Telephone Encounter (Signed)
Left voicemail for patient to call me back.

## 2020-05-03 NOTE — Telephone Encounter (Signed)
Patient returned my call she is scheduled at [facility] for 05/04/20.

## 2020-05-03 NOTE — Telephone Encounter (Signed)
Pt called to schedule MRI apt

## 2020-05-04 ENCOUNTER — Other Ambulatory Visit: Payer: Self-pay

## 2020-05-04 ENCOUNTER — Ambulatory Visit (INDEPENDENT_AMBULATORY_CARE_PROVIDER_SITE_OTHER): Payer: 59

## 2020-05-04 DIAGNOSIS — R269 Unspecified abnormalities of gait and mobility: Secondary | ICD-10-CM | POA: Diagnosis not present

## 2020-05-04 DIAGNOSIS — G3281 Cerebellar ataxia in diseases classified elsewhere: Secondary | ICD-10-CM | POA: Diagnosis not present

## 2020-05-05 ENCOUNTER — Telehealth: Payer: Self-pay | Admitting: Neurology

## 2020-05-05 NOTE — Telephone Encounter (Signed)
I called the patient.  MRI of the brain shows no change from the fall 2020.  The patient mainly is suffering from fatigue issues, she could benefit from physical therapy but her insurance would not cover home health therapy.  We have set her up for outpatient therapy.   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 - Mild periventricular subcortical chronic small vessel ischemic disease.   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  - No acute findings. No change from 10/08/19.

## 2020-05-06 ENCOUNTER — Encounter: Payer: Self-pay | Admitting: Internal Medicine

## 2020-05-06 ENCOUNTER — Other Ambulatory Visit: Payer: Self-pay | Admitting: Pharmacist

## 2020-05-06 ENCOUNTER — Ambulatory Visit: Payer: 59 | Attending: Internal Medicine | Admitting: Internal Medicine

## 2020-05-06 ENCOUNTER — Other Ambulatory Visit: Payer: Self-pay

## 2020-05-06 VITALS — BP 129/83 | HR 87 | Temp 97.2°F | Resp 16 | Wt 195.4 lb

## 2020-05-06 DIAGNOSIS — I6930 Unspecified sequelae of cerebral infarction: Secondary | ICD-10-CM

## 2020-05-06 DIAGNOSIS — E1140 Type 2 diabetes mellitus with diabetic neuropathy, unspecified: Secondary | ICD-10-CM | POA: Diagnosis not present

## 2020-05-06 DIAGNOSIS — I10 Essential (primary) hypertension: Secondary | ICD-10-CM | POA: Diagnosis not present

## 2020-05-06 DIAGNOSIS — E1165 Type 2 diabetes mellitus with hyperglycemia: Secondary | ICD-10-CM | POA: Diagnosis not present

## 2020-05-06 DIAGNOSIS — IMO0002 Reserved for concepts with insufficient information to code with codable children: Secondary | ICD-10-CM

## 2020-05-06 DIAGNOSIS — Z5320 Procedure and treatment not carried out because of patient's decision for unspecified reasons: Secondary | ICD-10-CM

## 2020-05-06 DIAGNOSIS — Z794 Long term (current) use of insulin: Secondary | ICD-10-CM | POA: Diagnosis not present

## 2020-05-06 DIAGNOSIS — D649 Anemia, unspecified: Secondary | ICD-10-CM

## 2020-05-06 DIAGNOSIS — Z1211 Encounter for screening for malignant neoplasm of colon: Secondary | ICD-10-CM

## 2020-05-06 DIAGNOSIS — E860 Dehydration: Secondary | ICD-10-CM | POA: Diagnosis not present

## 2020-05-06 DIAGNOSIS — Z09 Encounter for follow-up examination after completed treatment for conditions other than malignant neoplasm: Secondary | ICD-10-CM

## 2020-05-06 LAB — GLUCOSE, POCT (MANUAL RESULT ENTRY): POC Glucose: 95 mg/dl (ref 70–99)

## 2020-05-06 MED ORDER — ONETOUCH VERIO VI STRP: ORAL_STRIP | 6 refills | Status: DC

## 2020-05-06 MED ORDER — INSULIN ASPART 100 UNIT/ML FLEXPEN: PEN_INJECTOR | SUBCUTANEOUS | 1 refills | Status: DC

## 2020-05-06 MED ORDER — INSULIN GLARGINE 100 UNIT/ML SOLOSTAR PEN: 55.0000 [IU] | PEN_INJECTOR | Freq: Every day | SUBCUTANEOUS | 2 refills | Status: DC

## 2020-05-06 MED ORDER — ONETOUCH VERIO W/DEVICE KIT: PACK | 0 refills | Status: DC

## 2020-05-06 MED ORDER — ONETOUCH DELICA PLUS LANCET33G MISC: 6 refills | Status: DC

## 2020-05-06 MED FILL — AMLODIPINE BESYLATE 10 MG T: 10 | 30 days supply | Qty: 30 | Fill #0

## 2020-05-06 MED FILL — risperiDONE 2 MG TABS: 2 | 30 days supply | Qty: 30 | Fill #0

## 2020-05-06 MED FILL — OMEPRAZOLE 20 MG CAP: 20 | 30 days supply | Qty: 30 | Fill #0

## 2020-05-06 MED FILL — METOPROLOL TARTRATE 25 MG T: 25 | 30 days supply | Qty: 120 | Fill #0

## 2020-05-06 MED FILL — ONDANSETRON HCL 4 MG TABLET: 4 | 10 days supply | Qty: 30 | Fill #0

## 2020-05-06 MED FILL — LANTUS SOLOSTAR 100 UNITS/M: 100 | 25 days supply | Qty: 15 | Fill #1

## 2020-05-06 MED FILL — METOCLOPRAMIDE 5 MG TABLET: 5 | 30 days supply | Qty: 90 | Fill #0

## 2020-05-06 NOTE — Progress Notes (Signed)
Patient ID: Nancy [name], female    DOB: 15-Aug-1961  MRN: [id number]  CC: Transition of Care Visit Date of hospitalization: 5/5-06/2020 Date of telephone call from case worker: 04/25/2020  Subjective: Nancy [name] is a 59 y.o. female who presents for Transition of Care Visit.  Her husband is with her. 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   Patient hospitalized x2 since last visit with me.  She was hospitalized in March for gallbladder pancreatitis and underwent laparoscopic cholecystectomy. -Most recent hospitalization was for dehydration with weakness.  Patient had reported nausea/vomiting and diarrhea with weakness and falls.  She was found to be dehydrated with creatinine of 2.38.  HCTZ and lisinopril were held.  Patient placed on Norvasc and metoprolol instead for blood pressure.  By the time of discharge creatinine was 1.24.  Today: She did not bring her medications with her. Since discharge, patient states that she is doing okay except urinating a lot.  She denies any dysuria.  She has not had any further vomiting or diarrhea.  However she notes that she sometimes gets diarrhea since having cholecystectomy and it depends on what she eats.  No falls since hospital discharge.  Recently saw the neurologist Dr. Jannifer Franklin.  He repeated MRI of the head.  This revealed chronic right parietal and right occipital cortical ischemic infarcts.  He has referred her for some outpatient physical therapy.  HTN: HCTZ was discontinued and patient told to hold lisinopril until she sees me in follow-up.  She has complied with this.  -placed on Norvasc 10 mg and metoprolol 25 mg twice a day instead. She tells me that she does not have either of these medicines at home.  DM: Checking blood sugars 3 times daily before meals  up until 4 days ago when she broke the lancet device.  She is requesting new diabetic testing supplies. -Log with her today.  Blood sugars before breakfast between 193-276.  Last reading was 99 4 days ago.  Before lunch 99-10 38.  Before dinner 115-208 She is taking Lantus 55 units daily and NovoLog 7 units with breakfast only -Doing better with eating habits.  She is baking all of her meats.  Reports that she is unable to eat fatty foods without having diarrhea since cholecystectomy.  Weight is down 7 pounds since March.  Parotid tumor: Referred back to ENT Dr. Redmond Baseman on last visit for biopsy given that she now has insurance.  Patient states she does not recall being called for an appointment as yet.   Patient Active Problem List   Diagnosis Date Noted  . Diabetic peripheral neuropathy ([place]) 04/26/2020  . ARF (acute renal failure) ([place]) 04/20/2020  . Generalized weakness 04/20/2020  . Dehydration 04/20/2020  . Generalized abdominal pain   . Pancreatitis, acute 02/29/2020  . Cerebrovascular accident (CVA) ([place]) 11/08/2019  . Stenosis of right carotid artery 11/08/2019  . Hyperlipidemia associated with type 2 diabetes mellitus ([place]) 11/08/2019  . Cortical age-related cataract of both eyes 08/30/2019  . Gait abnormality 08/30/2019  . Statin declined 08/30/2019  . Gastroesophageal reflux disease without esophagitis 04/18/2018  . 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 Uncontrolled type 2 diabetes mellitus with diabetic neuropathy, with long-term current use of insulin ([place]) 10/29/2017  . History of macular degeneration 10/29/2017  . Chronic cervical pain 10/29/2016  . Myalgia 09/14/2016  . Insomnia 09/14/2016  .  Tinea pedis of both feet 08/20/2016  . Macular degeneration 08/17/2016  . Low back pain 09/21/2014  . Hypertension 09/21/2014  . Diabetic gastroparesis associated with type 2 diabetes mellitus ([place]) 09/14/2014  . Thyroid nodule 08/17/2014  . Obesity (BMI 30-39.9) 08/17/2014  .  Hyperlipidemia 08/05/2014  . Nausea & vomiting 08/04/2014  . Type II diabetes mellitus with neurological manifestations, uncontrolled ([place]) 08/04/2014  . Elevated serum protein level 08/04/2014     Current Outpatient Medications on File Prior to Visit  Medication Sig Dispense Refill  . acetaminophen (TYLENOL) 500 MG tablet Take 500 mg by mouth every 6 (six) hours as needed for moderate pain or headache.    Marland Kitchen amLODipine (NORVASC) 10 MG tablet Take 1 tablet (10 mg total) by mouth daily. 30 tablet 3  . aspirin EC 81 MG tablet Take 1 tablet (81 mg total) by mouth daily. 100 tablet 1  . atorvastatin (LIPITOR) 20 MG tablet Take 1 tablet (20 mg total) by mouth daily. 90 tablet 3  . cyclobenzaprine (FLEXERIL) 5 MG tablet Take 5 mg by mouth daily as needed for muscle spasms.    . DULoxetine (CYMBALTA) 20 MG capsule TAKE 1 CAPSULE (20 MG TOTAL) BY MOUTH DAILY. 30 capsule 2  . ezetimibe (ZETIA) 10 MG tablet Take 10 mg by mouth daily.    Marland Kitchen gabapentin (NEURONTIN) 300 MG capsule Take 1 capsule (300 mg total) by mouth at bedtime. 30 capsule 2  . Insulin Pen Needle (PEN NEEDLES) 32G X 4 MM MISC 1 each by Does not apply route 4 (four) times daily. 120 each 11  . metoCLOPramide (REGLAN) 5 MG tablet Take 1 tablet (5 mg total) by mouth 3 (three) times daily before meals. 90 tablet 1  . metoprolol tartrate (LOPRESSOR) 25 MG tablet Take 1 tablet (25 mg total) by mouth 2 (two) times daily. 120 tablet 3  . omeprazole (PRILOSEC) 20 MG capsule Take 1 capsule (20 mg total) by mouth daily. 30 capsule 1  . ondansetron (ZOFRAN) 4 MG tablet Take 1 tablet (4 mg total) by mouth every 8 (eight) hours as needed for nausea or vomiting. 30 tablet 0  . Propylene Glycol (SYSTANE COMPLETE OP) Apply 1 drop to eye daily as needed (dry eyes).    . risperiDONE (RISPERDAL) 2 MG tablet Take 1 tablet (2 mg total) by mouth at bedtime. 30 tablet 2  . senna-docusate (SENOKOT S) 8.6-50 MG tablet Take 1 tablet by mouth daily as needed for  moderate constipation. 30 tablet 2  . traZODone (DESYREL) 50 MG tablet Take 50 mg by mouth at bedtime.    . [DISCONTINUED] Insulin NPH Isophane & Regular (RELION 70/30 [place]) Inject 35 Units into the skin 2 (two) times daily.     No current facility-administered medications on file prior to visit.    Allergies  Allergen Reactions  . Hydralazine Hcl     Hair loss  . Hydrocodone Other (See Comments)    Upset stomach, itching  . Metformin And Related Nausea And Vomiting and Other (See Comments)    Stomach pains   . Plaquenil [Hydroxychloroquine Sulfate] Hives  . Sulfur Hives    Social History   Socioeconomic History  . Marital status: Legally Separated    Spouse name: [name]  . Number of children: 1  . Years of education: some colle  . Highest education level: Not on file  Occupational History  . Occupation: Employed FT as Landscape architect    Comment: [employer] , NA  Tobacco Use  .  Smoking status: Former Smoker    Packs/day: 0.25    Years: 0.50    Pack years: 0.12    Types: Cigarettes  . Smokeless tobacco: Never Used  . Tobacco comment: quit yrs ago  Substance and Sexual Activity  . Alcohol use: No    Comment: occasion   . Drug use: No  . Sexual activity: Yes    Birth control/protection: None  Other Topics Concern  . Not on file  Social History Narrative   02/18/20 lives alone     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    Social Determinants of Health   Financial Resource Strain:   . Difficulty of Paying Living Expenses:   Food Insecurity:   . Worried About Charity fundraiser in the Last Year:   . Arboriculturist in the Last Year:   Transportation Needs:   . Film/video editor (Medical):   Marland Kitchen Lack of Transportation (Non-Medical):   Physical Activity:   . Days of Exercise per Week:   . Minutes of Exercise per Session:   Stress:   . Feeling of Stress :   Social Connections:   . Frequency of Communication with Friends and  Family:   . Frequency of Social Gatherings with Friends and Family:   . Attends Religious Services:   . Active Member of Clubs or Organizations:   . Attends Archivist Meetings:   Marland Kitchen Marital Status:   Intimate Partner Violence:   . Fear of Current or Ex-Partner:   . Emotionally Abused:   Marland Kitchen Physically Abused:   . Sexually Abused:     Family History  Problem Relation Age of Onset  . Hypertension Mother   . Heart disease Mother   . Diabetes Mother   . Thyroid disease Mother   . Congestive Heart Failure Mother   . Breast cancer Maternal Grandmother   . Colon cancer Maternal Grandfather   . Heart attack Sister   . Heart disease Brother   . Hyperlipidemia Brother   . Hypertension Brother   . Diabetes Father   . Breast cancer Maternal Aunt     Past Surgical History:  Procedure Laterality Date  . ABDOMINAL HYSTERECTOMY  2005  . CATARACT EXTRACTION Left 11/2019  . CESAREAN SECTION  1983   . CHOLECYSTECTOMY N/A 03/05/2020   Procedure: LAPAROSCOPIC CHOLECYSTECTOMY WITH INTRAOPERATIVE CHOLANGIOGRAM;  Surgeon: Donnie Mesa, MD;  Location: [facility];  Service: General;  Laterality: N/A;  . ESOPHAGOGASTRODUODENOSCOPY (EGD) WITH PROPOFOL Left 08/26/2014   Procedure: ESOPHAGOGASTRODUODENOSCOPY (EGD) WITH PROPOFOL;  Surgeon: Arta Silence, MD;  Location: [facility];  Service: Endoscopy;  Laterality: Left;  . ESOPHAGOGASTRODUODENOSCOPY (EGD) WITH PROPOFOL N/A 03/03/2020   Procedure: ESOPHAGOGASTRODUODENOSCOPY (EGD) WITH PROPOFOL;  Surgeon: Jerene Bears, MD;  Location: [facility];  Service: Gastroenterology;  Laterality: N/A;    ROS: Review of Systems Negative except as stated above  PHYSICAL EXAM: BP 129/83   Pulse 87   Temp (!) 97.2 F (36.2 C)   Resp 16   Wt 195 lb 6.4 oz (88.6 kg)   SpO2 96%   BMI 34.61 kg/m   Wt Readings from Last 3 Encounters:  05/06/20 195 lb 6.4 oz (88.6 kg)  04/21/20 206 lb (93.4 kg)  02/29/20 202 lb (91.6 kg)   BP 115/90 Physical  Exam  General appearance - alert, well appearing, and in no distress Mental status - normal mood, behavior, speech, dress, motor activity, and thought processes Mouth -oral  mucosa is slightly dry. Neck -supple without lymphadenopathy. Chest - clear to auscultation, no wheezes, rales or rhonchi, symmetric air entry Heart - normal rate, regular rhythm, normal S1, S2, no murmurs, rubs, clicks or gallops Neurological -she ambulates with a cane  extremities -trace lower extremity edema   CMP Latest Ref Rng & Units 04/22/2020 04/21/2020 04/21/2020  Glucose 70 - 99 mg/dL 189(H) 244(H) 191(H)  BUN 6 - 20 mg/dL 28(H) 31(H) 27(H)  Creatinine 0.44 - 1.00 mg/dL 1.24(H) 1.89(H) 2.42(H)  Sodium 135 - 145 mmol/L 138 137 136  Potassium 3.5 - 5.1 mmol/L 4.2 4.1 4.5  Chloride 98 - 111 mmol/L 108 103 101  CO2 22 - 32 mmol/L 23 24 22   Calcium 8.9 - 10.3 mg/dL 8.5(L) 8.7(L) 9.0  Total Protein 6.5 - 8.1 g/dL - - 7.5  Total Bilirubin 0.3 - 1.2 mg/dL - - 0.7  Alkaline Phos 38 - 126 U/L - - 58  AST 15 - 41 U/L - - 16  ALT 0 - 44 U/L - - 15   Lipid Panel     Component Value Date/Time   CHOL 149 02/29/2020 1315   CHOL 193 02/11/2020 1103   TRIG 97 02/29/2020 1315   HDL 49 02/29/2020 1315   HDL 40 02/11/2020 1103   CHOLHDL 3.0 02/29/2020 1315   VLDL 19 02/29/2020 1315   LDLCALC 81 02/29/2020 1315   LDLCALC 122 (H) 02/11/2020 1103    CBC    Component Value Date/Time   WBC 10.2 04/22/2020 0449   RBC 3.40 (L) 04/22/2020 0449   HGB 9.9 (L) 04/22/2020 0449   HGB 12.7 08/27/2019 1707   HGB 12.5 07/08/2008 1551   HCT 31.6 (L) 04/22/2020 0449   HCT 39.2 08/27/2019 1707   HCT 37.3 07/08/2008 1551   PLT 229 04/22/2020 0449   PLT 276 08/27/2019 1707   MCV 92.9 04/22/2020 0449   MCV 87 08/27/2019 1707   MCV 84.7 07/08/2008 1551   MCH 29.1 04/22/2020 0449   MCHC 31.3 04/22/2020 0449   RDW 12.4 04/22/2020 0449   RDW 11.9 08/27/2019 1707   RDW 13.0 07/08/2008 1551   LYMPHSABS 4.3 (H) 04/21/2020 0201    LYMPHSABS 3.9 (H) 08/27/2018 1131   LYMPHSABS 4.6 (H) 07/08/2008 1551   MONOABS 1.4 (H) 04/21/2020 0201   MONOABS 0.6 07/08/2008 1551   EOSABS 0.2 04/21/2020 0201   EOSABS 0.8 (H) 08/27/2018 1131   BASOSABS 0.1 04/21/2020 0201   BASOSABS 0.1 08/27/2018 1131   BASOSABS 0.1 07/08/2008 1551    ASSESSMENT AND PLAN: 1. Hospital discharge follow-up 2. Dehydration Patient is tongue is dry suggesting she may still be a little dehydrated.  I have held off on restarting HCTZ.  Encouraged her to drink several glasses of water daily. - Basic Metabolic Panel  3. Uncontrolled type 2 diabetes mellitus with diabetic neuropathy, with long-term current use of insulin (HCC) Continue Lantus 55 units.  Change NovoLog to 4 units with breakfast and dinner. We will send a prescription to her pharmacy for diabetic testing supplies.  Advised to continue checking blood sugars 3 times a day before meals and bring in those readings on her next visit to see me in 2 weeks.  Continue healthy eating habits - POCT glucose (manual entry) - insulin aspart (NOVOLOG) 100 UNIT/ML FlexPen; 4 units with breakfast and dinner  Dispense: 15 mL; Refill: 1 - insulin glargine (LANTUS) 100 UNIT/ML Solostar Pen; Inject 55 Units into the skin at bedtime.  Dispense: 15 mL; Refill:  2  4. Essential hypertension Blood pressure today is not at goal but close.  She was discharged home from the hospital on amlodipine and metoprolol but patient states she is not taking even though she did medication reconciliation over the phone with our case manager post hospital.  Advised her to follow-up with me in 2 weeks and bring all of her medicines with her so that we can do a med reconciliation  5. Screening mammography declined Discussed the importance of screening for breast cancer.  Patient declined again having a mammogram ordered  6. Screening for colon cancer - Ambulatory referral to Gastroenterology  7. History of cerebrovascular accident  (CVA) with residual deficit Focus is on good blood pressure and diabetes control.  Continue aspirin and atorvastatin  8. Normocytic anemia -pt with anemia during hospitalization.  Part of this may be dilutional as she received fluids on that admission for dehydration.  Recheck CBC today - CBC - Iron, TIBC and Ferritin Panel     Patient was given the opportunity to ask questions.  Patient verbalized understanding of the plan and was able to repeat key elements of the plan.   Orders Placed This Encounter  Procedures  . Basic Metabolic Panel  . CBC  . Iron, TIBC and Ferritin Panel  . Ambulatory referral to Gastroenterology  . POCT glucose (manual entry)     Requested Prescriptions   Signed Prescriptions Disp Refills  . insulin aspart (NOVOLOG) 100 UNIT/ML FlexPen 15 mL 1    Sig: 4 units with breakfast and dinner  . insulin glargine (LANTUS) 100 UNIT/ML Solostar Pen 15 mL 2    Sig: Inject 55 Units into the skin at bedtime.    Return in about 2 weeks (around 05/20/2020).  [name], MD, FACP

## 2020-05-06 NOTE — Patient Instructions (Signed)
He will follow-up with me again in 2 weeks. Please bring all of your medications with you to that appointment for [place] to do a medication reconciliation. Also bring your blood sugar readings with you so that we can determine whether to make further adjustments to your insulin.  Continue Lantus insulin 55 units daily. Use NovoLog insulin 4 units with breakfast and dinner.

## 2020-05-07 LAB — IRON,TIBC AND FERRITIN PANEL
Ferritin: 125 ng/mL (ref 15–150)
Iron Saturation: 24 % (ref 15–55)
Iron: 56 ug/dL (ref 27–159)
Total Iron Binding Capacity: 238 ug/dL — ABNORMAL LOW (ref 250–450)
UIBC: 182 ug/dL (ref 131–425)

## 2020-05-07 LAB — CBC
Hematocrit: 34.5 % (ref 34.0–46.6)
Hemoglobin: 11 g/dL — ABNORMAL LOW (ref 11.1–15.9)
MCH: 28.7 pg (ref 26.6–33.0)
MCHC: 31.9 g/dL (ref 31.5–35.7)
MCV: 90 fL (ref 79–97)
Platelets: 285 10*3/uL (ref 150–450)
RBC: 3.83 x10E6/uL (ref 3.77–5.28)
RDW: 12.2 % (ref 11.7–15.4)
WBC: 9.3 10*3/uL (ref 3.4–10.8)

## 2020-05-07 LAB — BASIC METABOLIC PANEL
BUN/Creatinine Ratio: 14 (ref 12–28)
BUN: 16 mg/dL (ref 8–27)
CO2: 25 mmol/L (ref 20–29)
Calcium: 9.6 mg/dL (ref 8.7–10.3)
Chloride: 104 mmol/L (ref 96–106)
Creatinine, Ser: 1.18 mg/dL — ABNORMAL HIGH (ref 0.57–1.00)
GFR calc Af Amer: 58 mL/min/{1.73_m2} — ABNORMAL LOW (ref >59)
GFR calc non Af Amer: 50 mL/min/{1.73_m2} — ABNORMAL LOW (ref >59)
Glucose: 87 mg/dL (ref 65–99)
Potassium: 4.7 mmol/L (ref 3.5–5.2)
Sodium: 142 mmol/L (ref 134–144)

## 2020-05-07 NOTE — Progress Notes (Signed)
Let patient know that her kidney function is not 100% but has improved compared to 2 weeks ago when she was in the hospital.  She still has some mild anemia but that has improved compared to 2 weeks ago.  Iron level reveals that the anemia is most likely due to chronic kidney issue.

## 2020-05-09 ENCOUNTER — Telehealth: Payer: Self-pay

## 2020-05-09 ENCOUNTER — Encounter: Payer: Self-pay | Admitting: Internal Medicine

## 2020-05-09 NOTE — Telephone Encounter (Signed)
Contacted pt to go over lab results pt is aware and doesn't have any questions or concerns 

## 2020-05-12 ENCOUNTER — Ambulatory Visit: Payer: 59 | Admitting: Internal Medicine

## 2020-05-19 ENCOUNTER — Ambulatory Visit: Payer: 59 | Admitting: Internal Medicine

## 2020-05-26 ENCOUNTER — Other Ambulatory Visit: Payer: Self-pay | Admitting: Pharmacist

## 2020-05-26 MED ORDER — INSULIN LISPRO (1 UNIT DIAL) 100 UNIT/ML (KWIKPEN): PEN_INJECTOR | SUBCUTANEOUS | 1 refills | Status: DC

## 2020-05-26 NOTE — Progress Notes (Signed)
Rx sent for Humalog per insurance preference.

## 2020-06-01 ENCOUNTER — Telehealth: Payer: Self-pay

## 2020-06-01 NOTE — Telephone Encounter (Signed)
I would recommend that we see her in the office first to discuss given medical complexity prior to screening colonoscopy

## 2020-06-01 NOTE — Telephone Encounter (Signed)
Dr. [name]:  Pt is fairly complex but overall stable in reiview except for Neuro-status.  She saw her Neurologist, Dr. Margette Fast on 02/18/20  He notes: "Slow or absent flow in the right cervical ICA with 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 There has also been a small remote left occipital cortex infarct. These findings are new from 2018 comparison."  She also she also saw her medical doc-Dr.Johnson in May and notes "Recently saw the neurologist Dr. Jannifer Franklin.  He repeated MRI of the head.  This revealed chronic right parietal and right occipital cortical ischemic infarcts.  He has referred her for some outpatient physical therapy."  Not sure if the changes are as recent as our 3 month guideline for CVAs or if significant enough, would like for you to review and advise.  Also been admitted to hospital in March for EGD w/you and gallblader removal.    Also had a second ER visit in May for dehydration.  She's got a lot going on.  Thanks for reviewing and directing.

## 2020-06-07 NOTE — Telephone Encounter (Signed)
Pt called and cancelled all procedures.  Will call back to address issues at a later date.

## 2020-06-21 ENCOUNTER — Telehealth: Payer: Self-pay

## 2020-06-21 NOTE — Telephone Encounter (Signed)
Will route to pcp 

## 2020-06-21 NOTE — Telephone Encounter (Signed)
Contacted pt to see is she is able to to a virtual visit with pcp regarding low blood sugars. Pt states she didn't want to do a virtual visit that she will keep her appointment with Amy on 7/20. Made pcp aware

## 2020-06-21 NOTE — Telephone Encounter (Signed)
Pt called to report EMS came to her home 06/20/20 for low blood sugar 35.  Pt reports today it is 154. Pt req return call. Pt scheduled appt 07/05/20 Durene Fruits NP.

## 2020-06-23 ENCOUNTER — Ambulatory Visit: Payer: 59 | Admitting: Neurology

## 2020-06-23 ENCOUNTER — Encounter: Payer: 59 | Admitting: Internal Medicine

## 2020-07-05 ENCOUNTER — Other Ambulatory Visit: Payer: Self-pay | Admitting: Family

## 2020-07-05 ENCOUNTER — Other Ambulatory Visit: Payer: Self-pay

## 2020-07-05 ENCOUNTER — Encounter: Payer: Self-pay | Admitting: Family

## 2020-07-05 ENCOUNTER — Ambulatory Visit: Payer: 59 | Attending: Family | Admitting: Family

## 2020-07-05 VITALS — BP 134/81 | HR 82 | Temp 98.1°F | Resp 16 | Ht 63.0 in | Wt 199.0 lb

## 2020-07-05 DIAGNOSIS — E1165 Type 2 diabetes mellitus with hyperglycemia: Secondary | ICD-10-CM

## 2020-07-05 DIAGNOSIS — F419 Anxiety disorder, unspecified: Secondary | ICD-10-CM

## 2020-07-05 DIAGNOSIS — R442 Other hallucinations: Secondary | ICD-10-CM | POA: Diagnosis not present

## 2020-07-05 DIAGNOSIS — Z794 Long term (current) use of insulin: Secondary | ICD-10-CM | POA: Diagnosis not present

## 2020-07-05 DIAGNOSIS — E162 Hypoglycemia, unspecified: Secondary | ICD-10-CM | POA: Diagnosis not present

## 2020-07-05 DIAGNOSIS — IMO0002 Reserved for concepts with insufficient information to code with codable children: Secondary | ICD-10-CM

## 2020-07-05 DIAGNOSIS — E1140 Type 2 diabetes mellitus with diabetic neuropathy, unspecified: Secondary | ICD-10-CM | POA: Diagnosis not present

## 2020-07-05 DIAGNOSIS — F329 Major depressive disorder, single episode, unspecified: Secondary | ICD-10-CM

## 2020-07-05 DIAGNOSIS — F32A Depression, unspecified: Secondary | ICD-10-CM

## 2020-07-05 LAB — POCT GLYCOSYLATED HEMOGLOBIN (HGB A1C): Hemoglobin A1C: 7.2 % — AB (ref 4.0–5.6)

## 2020-07-05 LAB — GLUCOSE, POCT (MANUAL RESULT ENTRY): POC Glucose: 322 mg/dl — AB (ref 70–99)

## 2020-07-05 MED ORDER — INSULIN GLARGINE 100 UNIT/ML SOLOSTAR PEN: 58.0000 [IU] | PEN_INJECTOR | Freq: Every day | SUBCUTANEOUS | 3 refills | Status: DC

## 2020-07-05 MED ORDER — RISPERIDONE 2 MG PO TABS: 2.0000 mg | ORAL_TABLET | Freq: Every day | ORAL | 2 refills | Status: DC

## 2020-07-05 MED ORDER — DULOXETINE HCL 20 MG PO CPEP: 20.0000 mg | ORAL_CAPSULE | Freq: Every day | ORAL | 2 refills | Status: DC

## 2020-07-05 MED ORDER — INSULIN LISPRO (1 UNIT DIAL) 100 UNIT/ML (KWIKPEN): PEN_INJECTOR | SUBCUTANEOUS | 3 refills | Status: DC

## 2020-07-05 MED ORDER — ONETOUCH VERIO VI STRP: ORAL_STRIP | 6 refills | Status: DC

## 2020-07-05 MED ORDER — ONETOUCH DELICA PLUS LANCET33G MISC: 6 refills | Status: DC

## 2020-07-05 MED FILL — HUMALOG 100 UNITS/ML KWIKPE: 100 | 28 days supply | Qty: 3 | Fill #0

## 2020-07-05 MED FILL — LANTUS SOLOSTAR 100 UNITS/M: 100 | 25 days supply | Qty: 15 | Fill #0

## 2020-07-05 MED FILL — risperiDONE 2 MG TABS: 2 | 30 days supply | Qty: 30 | Fill #0

## 2020-07-05 NOTE — Progress Notes (Signed)
Routine DM heck up  BGS 322 breakfast 30 to 45 min ago  A1c 7.2

## 2020-07-05 NOTE — Progress Notes (Signed)
Blood glucose and hemoglobin A1C discussed in clinic.

## 2020-07-05 NOTE — Patient Instructions (Signed)
Increase Lantus. Continue Humalog. Follow-up with clinical pharmacist in 1 month for diabetes checkup. Follow-up with primary physician in 3 months or sooner if needed. Diabetes Basics  Diabetes (diabetes mellitus) is a long-term (chronic) disease. It occurs when the body does not properly use sugar (glucose) that is released from food after you eat. Diabetes may be caused by one or both of these problems:  Your pancreas does not make enough of a hormone called insulin.  Your body does not react in a normal way to insulin that it makes. Insulin lets sugars (glucose) go into cells in your body. This gives you energy. If you have diabetes, sugars cannot get into cells. This causes high blood sugar (hyperglycemia). Follow these instructions at home: How is diabetes treated? You may need to take insulin or other diabetes medicines daily to keep your blood sugar in balance. Take your diabetes medicines every day as told by your doctor. List your diabetes medicines here: Diabetes medicines  Name of medicine: ______________________________ ? Amount (dose): _______________ Time (a.m./p.m.): _______________ Notes: ___________________________________  Name of medicine: ______________________________ ? Amount (dose): _______________ Time (a.m./p.m.): _______________ Notes: ___________________________________  Name of medicine: ______________________________ ? Amount (dose): _______________ Time (a.m./p.m.): _______________ Notes: ___________________________________ If you use insulin, you will learn how to give yourself insulin by injection. You may need to adjust the amount based on the food that you eat. List the types of insulin you use here: Insulin  Insulin type: ______________________________ ? Amount (dose): _______________ Time (a.m./p.m.): _______________ Notes: ___________________________________  Insulin type: ______________________________ ? Amount (dose): _______________ Time  (a.m./p.m.): _______________ Notes: ___________________________________  Insulin type: ______________________________ ? Amount (dose): _______________ Time (a.m./p.m.): _______________ Notes: ___________________________________  Insulin type: ______________________________ ? Amount (dose): _______________ Time (a.m./p.m.): _______________ Notes: ___________________________________  Insulin type: ______________________________ ? Amount (dose): _______________ Time (a.m./p.m.): _______________ Notes: ___________________________________ How do I manage my blood sugar?  Check your blood sugar levels using a blood glucose monitor as directed by your doctor. Your doctor will set treatment goals for you. Generally, you should have these blood sugar levels:  Before meals (preprandial): 80-130 mg/dL (4.4-7.2 mmol/L).  After meals (postprandial): below 180 mg/dL (10 mmol/L).  A1c level: less than 7%. Write down the times that you will check your blood sugar levels: Blood sugar checks  Time: _______________ Notes: ___________________________________  Time: _______________ Notes: ___________________________________  Time: _______________ Notes: ___________________________________  Time: _______________ Notes: ___________________________________  Time: _______________ Notes: ___________________________________  Time: _______________ Notes: ___________________________________  What do I need to know about low blood sugar? Low blood sugar is called hypoglycemia. This is when blood sugar is at or below 70 mg/dL (3.9 mmol/L). Symptoms may include:  Feeling: ? Hungry. ? Worried or nervous (anxious). ? Sweaty and clammy. ? Confused. ? Dizzy. ? Sleepy. ? Sick to your stomach (nauseous).  Having: ? A fast heartbeat. ? A headache. ? A change in your vision. ? Tingling or no feeling (numbness) around the mouth, lips, or tongue. ? Jerky movements that you cannot control  (seizure).  Having trouble with: ? Moving (coordination). ? Sleeping. ? Passing out (fainting). ? Getting upset easily (irritability). Treating low blood sugar To treat low blood sugar, eat or drink something sugary right away. If you can think clearly and swallow safely, follow the 15:15 rule:  Take 15 grams of a fast-acting carb (carbohydrate). Talk with your doctor about how much you should take.  Some fast-acting carbs are: ? Sugar tablets (glucose pills). Take 3-4 glucose pills. ? 6-8 pieces of hard candy. ? 4-6  oz (120-150 mL) of fruit juice. ? 4-6 oz (120-150 mL) of regular (not diet) soda. ? 1 Tbsp (15 mL) honey or sugar.  Check your blood sugar 15 minutes after you take the carb.  If your blood sugar is still at or below 70 mg/dL (3.9 mmol/L), take 15 grams of a carb again.  If your blood sugar does not go above 70 mg/dL (3.9 mmol/L) after 3 tries, get help right away.  After your blood sugar goes back to normal, eat a meal or a snack within 1 hour. Treating very low blood sugar If your blood sugar is at or below 54 mg/dL (3 mmol/L), you have very low blood sugar (severe hypoglycemia). This is an emergency. Do not wait to see if the symptoms will go away. Get medical help right away. Call your local emergency services (911 in the [place] Do not drive yourself to the hospital. Questions to ask your health care provider  Do I need to meet with a diabetes educator?  What equipment will I need to care for myself at home?  What diabetes medicines do I need? When should I take them?  How often do I need to check my blood sugar?  What number can I call if I have questions?  When is my next doctor's visit?  Where can I find a support group for people with diabetes? Where to find more information  American Diabetes Association: www.diabetes.org  American Association of Diabetes Educators: www.diabeteseducator.org/patient-resources Contact a doctor if:  Your blood  sugar is at or above 240 mg/dL (13.3 mmol/L) for 2 days in a row.  You have been sick or have had a fever for 2 days or more, and you are not getting better.  You have any of these problems for more than 6 hours: ? You cannot eat or drink. ? You feel sick to your stomach (nauseous). ? You throw up (vomit). ? You have watery poop (diarrhea). Get help right away if:  Your blood sugar is lower than 54 mg/dL (3 mmol/L).  You get confused.  You have trouble: ? Thinking clearly. ? Breathing. Summary  Diabetes (diabetes mellitus) is a long-term (chronic) disease. It occurs when the body does not properly use sugar (glucose) that is released from food after digestion.  Take insulin and diabetes medicines as told.  Check your blood sugar every day, as often as told.  Keep all follow-up visits as told by your doctor. This is important. This information is not intended to replace advice given to you by your health care provider. Make sure you discuss any questions you have with your health care provider. Document Revised: 08/26/2019 Document Reviewed: 03/07/2018 [employer] Patient Education  [place].

## 2020-07-05 NOTE — Progress Notes (Signed)
Patient ID: Nancy [name], female    DOB: 1961-09-02  MRN: [id number]  CC: Diabetes Follow-Up  Subjective: Nancy [name] is a 59 y.o. female with history of hypertension, cerebrovascular accident, stenosis of right carotid artery, type II diabetes mellitus with neurological manifestations, hyperlipidemia associated with type 2 diabetes mellitus, and acute renal failure who presents for low blood sugar.  1. DIABETES TYPE 2 FOLLOW-UP: Last A1C:  7.2% on 07/05/2020  Are you fasting today: '[]'$  Yes '[x]'$  No, ate frosted rice krispies with whole milk 30 minutes prior to this appointment  Have you taken your anti-diabetic medications today: '[x]'$  Yes '[]'$  No  Med Adherence:  '[x]'$  Yes    '[]'$  No Medication side effects:  '[]'$  Yes    '[x]'$  No Home Monitoring?  '[x]'$  Yes    '[]'$  No Home glucose results range:  190's in the morning  140's midday 1480's at night Diet Adherence: '[x]'$  Yes    '[]'$  No Exercise: '[]'$  Yes    '[x]'$  No Hypoglycemic episodes?: '[x]'$  Yes    '[]'$  No Numbness of the feet? '[x]'$  Yes, reports she has an appointment at [facility] in 3 days.  Retinopathy hx? '[]'$  Yes    '[x]'$  No Last eye exam: December 2020, cataract removed last year left    Comments: Last visit 05/06/2020 with Nancy [name]. During that encounter continued on Lantus 55 units. Change Novolog to 4 units with breakfast and dinner.   On 05/26/2020 patient switched to Insulin Lispro 4 units with breakfast and dinner per insurance preference.   On 06/21/2020 patient called to report EMS came to her home on 06/20/2020 for low blood sugar 35.   Today reports the low blood sugar incident occurred on 06/20/2020 sometime around 5 pm (reports she had not eaten dinner at that time). Reports she cannot recall all of the events surrounding that time. However, reports that she can especially remember checking her blood sugar a few hours prior to 5 pm with a reading of 178. Reports she had not eaten lunch when she checked it at that time and still  administered the Humalog. Reports later (around 5 pm) she remembers getting up from her bed and falling backwards landing on the bed. Reports at the time she believes she did pass out. Reports her brother was at home with her during that time and called EMS. Reports when she came to she was laying on the bed at home and saw a lot of medical workers around her. Reports her blood sugar at that time was 35 and she was given a peanut butter sandwich, orange juice, and a sugar tablet. Reports her blood sugar was rechecked and it was 95. Reports because her blood sugar increased she did not get transported to a medical facility. Denies any additional low blood sugars since that occurrence.    2. HALLUCINATIONS FOLLOW-UP:  Last visit 11/05/2019 with Nancy [name]. During that encounter patient with tactile hallucination. Resolved with Risperidone. Advised to follow-up with [facility].  Today patient reports she went to [facility] and was told they do not take her insurance. Reports she then went to [facility], last prescription filled in June, and states they will no longer fill her prescription because she needs to be seen in-person for an appointment. Reports hallucinations are better with medication. Reports she has been without medication for at least 4 weeks because she ran out. Reports she has bilateral eye hallucinations where she feels like items are in her eye and as a  result she picks at her eyes a lot.   3. ANXIETY AND DEPRESSION FOLLOW-UP:  Today reports the main source of anxiety and depression for her is that she is currently living with brother because she does not have anywhere else to go. Denies thoughts of self-harm and suicidal ideation.   GAD 7 : Generalized Anxiety Score 07/05/2020 05/06/2020 02/09/2020 11/05/2019  Nervous, Anxious, on Edge 0 0 1 1  Control/stop worrying '3 2 2 '$ -  Worry too much - different things '1 2 1 2  '$ Trouble relaxing '1 1 1 3  '$ Restless 0 0 1 -  Easily annoyed or irritable 0 0 0 1    Afraid - awful might happen 0 '1 1 2  '$ Total GAD 7 Score '5 6 7 '$ -    Depression screen [facility] 2/9 07/05/2020 05/06/2020 02/09/2020  Decreased Interest 0 0 1  Down, Depressed, Hopeless '1 1 3  '$ PHQ - 2 Score '1 1 4  '$ Altered sleeping 0 - 2  Tired, decreased energy 1 - 1  Change in appetite 0 - 2  Feeling bad or failure about yourself  1 - 1  Trouble concentrating 0 - 1  Moving slowly or fidgety/restless 0 - 0  Suicidal thoughts 0 - 0  PHQ-9 Score 3 - 11  Some recent data might be hidden     Patient Active Problem List   Diagnosis Date Noted   History of cerebrovascular accident (CVA) with residual deficit 05/06/2020   Diabetic peripheral neuropathy ([place]) 04/26/2020   ARF (acute renal failure) ([place]) 04/20/2020   Generalized weakness 04/20/2020   Dehydration 04/20/2020   Generalized abdominal pain    Pancreatitis, acute 02/29/2020   Cerebrovascular accident (CVA) ([place]) 11/08/2019   Stenosis of right carotid artery 11/08/2019   Hyperlipidemia associated with type 2 diabetes mellitus ([place]) 11/08/2019   Cortical age-related cataract of both eyes 08/30/2019   Gait abnormality 08/30/2019   Statin declined 08/30/2019   Gastroesophageal reflux disease without esophagitis 04/18/2018   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   Uncontrolled type 2 diabetes mellitus with diabetic neuropathy, with long-term current use of insulin ([place]) 10/29/2017   History of macular degeneration 10/29/2017   Chronic cervical pain 10/29/2016   Myalgia 09/14/2016   Insomnia 09/14/2016   Tinea pedis of both feet 08/20/2016   Macular degeneration 08/17/2016   Low back pain 09/21/2014   Hypertension 09/21/2014   Diabetic gastroparesis associated with type 2 diabetes mellitus ([place]) 09/14/2014   Thyroid nodule 08/17/2014   Obesity (BMI 30-39.9) 08/17/2014   Hyperlipidemia 08/05/2014   Nausea & vomiting 08/04/2014   Type II diabetes mellitus with neurological manifestations, uncontrolled ([place])  08/04/2014   Elevated serum protein level 08/04/2014     Current Outpatient Medications on File Prior to Visit  Medication Sig Dispense Refill   acetaminophen (TYLENOL) 500 MG tablet Take 500 mg by mouth every 6 (six) hours as needed for moderate pain or headache.     amLODipine (NORVASC) 10 MG tablet Take 1 tablet (10 mg total) by mouth daily. 30 tablet 3   aspirin EC 81 MG tablet Take 1 tablet (81 mg total) by mouth daily. 100 tablet 1   atorvastatin (LIPITOR) 20 MG tablet Take 1 tablet (20 mg total) by mouth daily. 90 tablet 3   Blood Glucose Monitoring Suppl (ONETOUCH VERIO) w/Device KIT Check blood sugar three times daily. E11.40 1 kit 0   cyclobenzaprine (FLEXERIL) 5 MG tablet Take 5 mg by mouth daily as needed for muscle spasms.  DULoxetine (CYMBALTA) 20 MG capsule TAKE 1 CAPSULE (20 MG TOTAL) BY MOUTH DAILY. 30 capsule 2   ezetimibe (ZETIA) 10 MG tablet Take 10 mg by mouth daily.     gabapentin (NEURONTIN) 300 MG capsule Take 1 capsule (300 mg total) by mouth at bedtime. 30 capsule 2   glucose blood (ONETOUCH VERIO) test strip Use as instructed to check blood sugar three times daily. E11.40 100 each 6   insulin glargine (LANTUS) 100 UNIT/ML Solostar Pen Inject 55 Units into the skin at bedtime. 15 mL 2   insulin lispro (HUMALOG KWIKPEN) 100 UNIT/ML KwikPen Inject 4 units before breakfast and dinner. 15 mL 1   Insulin Pen Needle (PEN NEEDLES) 32G X 4 MM MISC 1 each by Does not apply route 4 (four) times daily. 120 each 11   Lancets (ONETOUCH DELICA PLUS GDJMEQ68T) MISC Use to check blood sugar three times daily. E11.40 100 each 6   metoCLOPramide (REGLAN) 5 MG tablet Take 1 tablet (5 mg total) by mouth 3 (three) times daily before meals. 90 tablet 1   metoprolol tartrate (LOPRESSOR) 25 MG tablet Take 1 tablet (25 mg total) by mouth 2 (two) times daily. 120 tablet 3   omeprazole (PRILOSEC) 20 MG capsule Take 1 capsule (20 mg total) by mouth daily. 30 capsule 1    ondansetron (ZOFRAN) 4 MG tablet Take 1 tablet (4 mg total) by mouth every 8 (eight) hours as needed for nausea or vomiting. 30 tablet 0   Propylene Glycol (SYSTANE COMPLETE OP) Apply 1 drop to eye daily as needed (dry eyes).     risperiDONE (RISPERDAL) 2 MG tablet Take 1 tablet (2 mg total) by mouth at bedtime. 30 tablet 2   senna-docusate (SENOKOT S) 8.6-50 MG tablet Take 1 tablet by mouth daily as needed for moderate constipation. 30 tablet 2   traZODone (DESYREL) 50 MG tablet Take 50 mg by mouth at bedtime.     [DISCONTINUED] Insulin NPH Isophane & Regular (RELION 70/30 [place]) Inject 35 Units into the skin 2 (two) times daily.     No current facility-administered medications on file prior to visit.    Allergies  Allergen Reactions   Hydralazine Hcl     Hair loss   Hydrocodone Other (See Comments)    Upset stomach, itching   Metformin And Related Nausea And Vomiting and Other (See Comments)    Stomach pains    Plaquenil [Hydroxychloroquine Sulfate] Hives   Sulfur Hives    Social History   Socioeconomic History   Marital status: Legally Separated    Spouse name: [name]   Number of children: 1   Years of education: some colle   Highest education level: Not on file  Occupational History   Occupation: Employed FT as Landscape architect    Comment: [employer] , [place]  Tobacco Use   Smoking status: Former Smoker    Packs/day: 0.25    Years: 0.50    Pack years: 0.12    Types: Cigarettes   Smokeless tobacco: Never Used   Tobacco comment: quit yrs ago  Vaping Use   Vaping Use: Never used  Substance and Sexual Activity   Alcohol use: No    Comment: occasion    Drug use: No   Sexual activity: Yes    Birth control/protection: None  Other Topics Concern   Not on file  Social History Narrative   02/18/20 lives alone     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  Social Determinants of Health   Financial Resource Strain:     Difficulty of Paying Living Expenses:   Food Insecurity:    Worried About Charity fundraiser in the Last Year:    Arboriculturist in the Last Year:   Transportation Needs:    Film/video editor (Medical):    Lack of Transportation (Non-Medical):   Physical Activity:    Days of Exercise per Week:    Minutes of Exercise per Session:   Stress:    Feeling of Stress :   Social Connections:    Frequency of Communication with Friends and Family:    Frequency of Social Gatherings with Friends and Family:    Attends Religious Services:    Active Member of Clubs or Organizations:    Attends Music therapist:    Marital Status:   Intimate Partner Violence:    Fear of Current or Ex-Partner:    Emotionally Abused:    Physically Abused:    Sexually Abused:     Family History  Problem Relation Age of Onset   Hypertension Mother    Heart disease Mother    Diabetes Mother    Thyroid disease Mother    Congestive Heart Failure Mother    Breast cancer Maternal Grandmother    Colon cancer Maternal Grandfather    Heart attack Sister    Heart disease Brother    Hyperlipidemia Brother    Hypertension Brother    Diabetes Father    Breast cancer Maternal Aunt     Past Surgical History:  Procedure Laterality Date   ABDOMINAL HYSTERECTOMY  2005   CATARACT EXTRACTION Left 11/2019   CESAREAN SECTION  1983    CHOLECYSTECTOMY N/A 03/05/2020   Procedure: LAPAROSCOPIC CHOLECYSTECTOMY WITH INTRAOPERATIVE CHOLANGIOGRAM;  Surgeon: Donnie Mesa, MD;  Location: [facility];  Service: General;  Laterality: N/A;   ESOPHAGOGASTRODUODENOSCOPY (EGD) WITH PROPOFOL Left 08/26/2014   Procedure: ESOPHAGOGASTRODUODENOSCOPY (EGD) WITH PROPOFOL;  Surgeon: Arta Silence, MD;  Location: [facility];  Service: Endoscopy;  Laterality: Left;   ESOPHAGOGASTRODUODENOSCOPY (EGD) WITH PROPOFOL N/A 03/03/2020   Procedure: ESOPHAGOGASTRODUODENOSCOPY (EGD) WITH PROPOFOL;   Surgeon: Jerene Bears, MD;  Location: [facility];  Service: Gastroenterology;  Laterality: N/A;    ROS: Review of Systems Negative except as stated above  PHYSICAL EXAM: Vitals with BMI 07/05/2020 05/06/2020 04/22/2020  Height '5\' 3"'$  - -  Weight 199 lbs 195 lbs 6 oz -  BMI 53.97 67.34 -  Systolic 193 790 240  Diastolic 81 83 71  Pulse 82 87 72  SpO2- 97%, room air  Temperature- 98.42F, oral   Physical Exam  General appearance - alert, well appearing, and in no distress and oriented to person, place, and time Mental status - alert, oriented to person, place, and time Neck - supple, no significant adenopathy Lymphatics - no palpable lymphadenopathy, no hepatosplenomegaly Chest - clear to auscultation, no wheezes, rales or rhonchi, symmetric air entry, no tachypnea, retractions or cyanosis Neurological - she ambulates with a cane Extremities - 1+ bilateral lower extremity edema Skin -   LEFT FOOT, LATERAL VIEW    LEFT FOOT MEDIAL VIEW  Results for orders placed or performed in visit on 07/05/20  POCT glycosylated hemoglobin (Hb A1C)  Result Value Ref Range   Hemoglobin A1C 7.2 (A) 4.0 - 5.6 %   HbA1c POC (<> result, manual entry)     HbA1c, POC (prediabetic range)     HbA1c, POC (controlled diabetic range)  Glucose (CBG)  Result Value Ref Range   POC Glucose 322 (A) 70 - 99 mg/dl    ASSESSMENT AND PLAN: 1. Uncontrolled type 2 diabetes mellitus with diabetic neuropathy, with long-term current use of insulin (HCC): -Blood glucose elevated in office today, patient asymptomatic. Patient reports she ate 30 minutes prior to this appointment and that she did take her Humalog this morning. Today hemoglobin A1C close to goal at 7.2%, goal < 7%. Improved from previous hemoglobin A1C of 10.8% in March 2021. -Blood sugars at home trend higher while fasting in the mornings in the 190's. Midday and evening blood sugars average closer to goal in the 140's.  -Endorses that she takes  both Lantus and Humalog as prescribed and without missing doses.  -Increase Lantus from 55 units at bedtime to 58 units at bedtime.  -Continue Humalog as prescribed.  -Glucose testing strips and lancets refilled. Check blood glucose at least 3 times daily. -To achieve an A1C goal of less than or equal to 7.0 percent, a fasting blood sugar of 80 to 130 mg/dL and a postprandial glucose (90 to 120 minutes after a meal) less than 180 mg/dL. In the event of sugars less than 60 mg/dl or greater than 902 mg/dl please notify the clinic ASAP. It is recommended that you undergo annual eye exams and annual foot exams. -Discussed the importance of healthy eating habits, low-carbohydrate diet, low-sugar diet, regular aerobic exercise (at least 150 minutes a week as tolerated) and medication compliance to achieve or maintain control of diabetes. -Return in 4 weeks for diabetes meter check with clinical pharmacist. Medication may be adjusted at that time if needed.  -Follow-up with primary physician in 3 months or sooner if needed.  -Last BMP, CBC, and iron panel obtained 05/06/2020. -Last microalbumin/creatinine urine ratio obtained 02/11/2020. - POCT glycosylated hemoglobin (Hb A1C) - Glucose (CBG) - insulin glargine (LANTUS) 100 UNIT/ML Solostar Pen; Inject 58 Units into the skin at bedtime.  Dispense: 15 mL; Refill: 3 - insulin lispro (HUMALOG KWIKPEN) 100 UNIT/ML KwikPen; Inject 4 units before breakfast and dinner.  Dispense: 15 mL; Refill: 3 - Lancets (ONETOUCH DELICA PLUS [id number]) MISC; Use to check blood sugar three times daily. E11.40  Dispense: 100 each; Refill: 6 - glucose blood (ONETOUCH VERIO) test strip; Use as instructed to check blood sugar three times daily. E11.40  Dispense: 100 each; Refill: 6 - Amb Referral to Clinical Pharmacist  2. Hypoglycemia: -Today reports the low blood sugar incident occurred on 06/20/2020 sometime around 5 pm (reports she had not eaten dinner at that time). Reports she  cannot recall all of the events surrounding that time. However, reports that she can especially remember checking her blood sugar a few hours prior to 5 pm with a reading of 178. Reports she had not eaten lunch when she checked it at that time and still administered the Humalog.   Reports later (around 5 pm) she remembers getting up from her bed and falling backwards landing on the bed. Reports at the time she believes she did pass out. Reports her brother was at home with her during that time and called EMS. Reports when she came to she was laying on the bed at home and saw a lot of medical workers around her. Reports her blood sugar at that time was 35 and she was given a peanut butter sandwich, orange juice, and a sugar tablet. Reports her blood sugar was rechecked and it was 95.   Reports because her blood sugar  increased she did not get transported to a medical facility. Reports she has not had a low blood sugar since that occurrence.   -Counseled patient that hypoglycemic event likely related to her taking Humalog insulin without eating. Counseled patient on the importance of eating a snack with each insulin administration. Patient verbalized understanding.  -Follow-up with primary physician as needed.  - POCT glycosylated hemoglobin (Hb A1C) - Glucose (CBG)  3. Tactile hallucinations:  -Patient reports she has bilateral eye hallucinations where she feels like items are in her eye and as a result she picks at her eyes a lot.  -Patient reports hallucinations are controlled while taking Risperidone. Reports she ran out of medication about 4 weeks ago.  -Continue Risperidone for tactile hallucinations.  -Follow-up with primary physician in 3 months or sooner if needed. - risperiDONE (RISPERDAL) 2 MG tablet; Take 1 tablet (2 mg total) by mouth at bedtime.  Dispense: 30 tablet; Refill: 2  4. Anxiety and depression: -Denies thoughts of self-harm and suicidal ideation.  -Continue Duloxetine for  anxiety and depression.  -Follow-up with primary physician in 3 months or sooner if needed. - DULoxetine (CYMBALTA) 20 MG capsule; Take 1 capsule (20 mg total) by mouth daily.  Dispense: 30 capsule; Refill: 2   Patient was given the opportunity to ask questions.  Patient verbalized understanding of the plan and was able to repeat key elements of the plan. Patient was given clear instructions to go to Emergency Department or return to medical center if symptoms don't improve, worsen, or new problems develop.The patient verbalized understanding.   Camillia Herter, NP

## 2020-07-11 ENCOUNTER — Other Ambulatory Visit: Payer: Self-pay | Admitting: Pharmacist

## 2020-07-11 DIAGNOSIS — IMO0002 Reserved for concepts with insufficient information to code with codable children: Secondary | ICD-10-CM

## 2020-07-11 MED ORDER — TRUEPLUS LANCETS 28G MISC: 11 refills | Status: DC

## 2020-07-11 MED ORDER — TRUE METRIX METER W/DEVICE KIT: PACK | 0 refills | Status: DC

## 2020-07-11 MED ORDER — GLUCOSE BLOOD VI STRP: ORAL_STRIP | 12 refills | Status: DC

## 2020-07-11 MED FILL — TRUE METRIX GO GLUCOSE METE: W/DEVICE | 30 days supply | Qty: 1 | Fill #0

## 2020-07-11 MED FILL — TRUEplus LANCETS 28G MISC: 30 days supply | Qty: 100 | Fill #0

## 2020-07-11 MED FILL — TRUE METRIX TEST STRIP: 90 days supply | Qty: 100 | Fill #0

## 2020-07-25 DIAGNOSIS — Z0271 Encounter for disability determination: Secondary | ICD-10-CM

## 2020-08-01 ENCOUNTER — Telehealth: Payer: Self-pay | Admitting: Internal Medicine

## 2020-08-01 NOTE — Telephone Encounter (Signed)
I call back the Pt an inform her what she need to bring for her application

## 2020-08-01 NOTE — Telephone Encounter (Signed)
Patient is calling to schedule an appt with Nancy [name] for orange card application. Please advise C- [phone]

## 2020-08-04 ENCOUNTER — Ambulatory Visit: Payer: 59 | Admitting: Pharmacist

## 2020-08-10 ENCOUNTER — Other Ambulatory Visit: Payer: Self-pay

## 2020-08-10 ENCOUNTER — Ambulatory Visit: Payer: Self-pay | Attending: Internal Medicine

## 2020-08-23 ENCOUNTER — Telehealth: Payer: Self-pay | Admitting: Internal Medicine

## 2020-08-23 NOTE — Telephone Encounter (Signed)
Pt was sent a letter from financial dept. Inform them, that the application they submitted was incomplete, since they were missing some documentation at the time of the appointment, Pt need to reschedule and resubmit all new papers and application for CAFA and OC, P.S. old documents has been sent back by mail to the Pt and Pt. need to make a new appt. 

## 2020-09-23 MED FILL — risperiDONE 2 MG TABS: 2 | 30 days supply | Qty: 30 | Fill #1

## 2020-10-06 ENCOUNTER — Ambulatory Visit: Payer: 59 | Admitting: Internal Medicine

## 2020-12-13 MED FILL — risperiDONE 2 MG TABS: 2 | 30 days supply | Qty: 30 | Fill #2

## 2021-02-06 ENCOUNTER — Other Ambulatory Visit: Payer: Self-pay

## 2021-02-06 ENCOUNTER — Encounter (HOSPITAL_COMMUNITY): Payer: Self-pay | Admitting: Psychiatry

## 2021-02-06 ENCOUNTER — Telehealth (INDEPENDENT_AMBULATORY_CARE_PROVIDER_SITE_OTHER): Payer: No Payment, Other | Admitting: Psychiatry

## 2021-02-06 ENCOUNTER — Ambulatory Visit (INDEPENDENT_AMBULATORY_CARE_PROVIDER_SITE_OTHER): Payer: No Payment, Other | Admitting: Licensed Clinical Social Worker

## 2021-02-06 ENCOUNTER — Other Ambulatory Visit (HOSPITAL_COMMUNITY): Payer: Self-pay | Admitting: Psychiatry

## 2021-02-06 DIAGNOSIS — F419 Anxiety disorder, unspecified: Secondary | ICD-10-CM

## 2021-02-06 DIAGNOSIS — R442 Other hallucinations: Secondary | ICD-10-CM

## 2021-02-06 DIAGNOSIS — F32A Depression, unspecified: Secondary | ICD-10-CM

## 2021-02-06 DIAGNOSIS — F418 Other specified anxiety disorders: Secondary | ICD-10-CM

## 2021-02-06 DIAGNOSIS — F411 Generalized anxiety disorder: Secondary | ICD-10-CM

## 2021-02-06 MED ORDER — MIRTAZAPINE 15 MG PO TABS: 15.0000 mg | ORAL_TABLET | Freq: Every day | ORAL | 2 refills | Status: DC

## 2021-02-06 MED ORDER — RISPERIDONE 3 MG PO TABS: 3.0000 mg | ORAL_TABLET | Freq: Every day | ORAL | 2 refills | Status: DC

## 2021-02-06 MED ORDER — DULOXETINE HCL 20 MG PO CPEP: 20.0000 mg | ORAL_CAPSULE | Freq: Every day | ORAL | 2 refills | Status: DC

## 2021-02-06 MED FILL — DULoxetine HCL 20 MG CPEP: 20 | 30 days supply | Qty: 30 | Fill #0

## 2021-02-06 MED FILL — risperiDONE 3 MG TABS: 3 | 30 days supply | Qty: 30 | Fill #0

## 2021-02-06 MED FILL — MIRTAZAPINE 15 MG TABLET: 15 | 30 days supply | Qty: 30 | Fill #0

## 2021-02-06 NOTE — Progress Notes (Signed)
Psychiatric Initial Adult Assessment  Virtual Visit via Video Note  I connected with Nancy [name] on 02/06/21 at 11:00 AM EST by a video enabled telemedicine application and verified that I am speaking with the correct person using two identifiers.  Location: Patient: Home Provider: Clinic   I discussed the limitations of evaluation and management by telemedicine and the availability of in person appointments. The patient expressed understanding and agreed to proceed.  I provided 45 minutes of non-face-to-face time during this encounter.    Patient Identification: Nancy [name] MRN:  [id number] Date of Evaluation:  02/06/2021 Referral Source: [name] Chief Complaint:  : "I need to talk to someone about my depression, hallucinations, and sleep"  Visit Diagnosis:    ICD-10-CM   1. Tactile hallucinations  R44.2 risperiDONE (RISPERDAL) 3 MG tablet  2. Anxiety and depression  F41.9 mirtazapine (REMERON) 15 MG tablet   F32.A DULoxetine (CYMBALTA) 20 MG capsule    History of Present Illness:  60 year old female seen today for initial psychiatric evaluation. She was referred to outpatient psychiatry by her PCP for medication management. She has a psychiatric history of depression. She is currently managed on Trazodone 50 mg nightly, Cymbalta 20 mg (notes receives by PCP for pain and depression), gabapentin 300 mg nightly (recieves from PCP), and Risperdal 2 mg nighty. She informed provider that her PCP wants psychiatry to take over her psychiatric medications. She notes her medications are somewhat effective in managing her psychiatric conditions.  Today she is well groomed, pleasant, cooperative, maintained eye contact, and engaged conversation. She informed provider that she has been more depressed lately and having problems sleeping. Provider conducted a PHQ 9 and patient scored a 16. Provider also conducted a GAD 7 and patient scored a 12. She notes that she constantly  worries about her sleep, her housing situation (noting that she currently sleeps on her sister couch), pain, and walking. Today she denies SI/HI/VAH, mania, or paranoia. She does endorse tactile hallucinations noting that she feels objects/shapes in her eyes and on her skin. She notes she picks at her eye in order to remove the objects. Patient notes that 3-4 days a week she gets 6-7 hours of sleep. She notes on the other days she suffers from insomnia. She also endorses having a poor appetite and weight loss.   Patient notes that the death of her mother in 02-28-2017 and her sister in 2000/02/29 was traumatic. She denies flashbacks, nightmares or avoidant behaviors.   Patient reports that Trazodone has been ineffective in managing her sleep. She is agreeable to discontinuing Trazodone and starting Mirtazapine 15 mg nightly to help manage, anxiety, appetite, depression, and sleep. Potential side effects of medication and risks vs benefits of treatment vs non-treatment were explained and discussed. All questions were answered.She is also agreeable to increasing Risperdal 2 mg nightly to 3 mg nightly to help manage tactile hallucinations. She will continue all other medications as prescribed.   Associated Signs/Symptoms: Depression Symptoms:  depressed mood, anhedonia, insomnia, fatigue, feelings of worthlessness/guilt, difficulty concentrating, hopelessness, impaired memory, anxiety, panic attacks, loss of energy/fatigue, weight loss, decreased appetite, (Hypo) Manic Symptoms:  Hallucinations, Irritable Mood, Anxiety Symptoms:  Excessive Worry, Psychotic Symptoms:  Hallucinations: Tactile PTSD Symptoms: Had a traumatic exposure:  Notes that the death of her mother in 02-28-2017 and her sister in 02-29-00 was traumatic  Past Psychiatric History: Depression   Previous Psychotropic Medications: Risperdal, trazodone, gabapentin (nerve pain), Cymbala (pain)  Substance Abuse History in the last  12 months:   No.  Consequences of Substance Abuse: NA  Past Medical History:  Past Medical History:  Diagnosis Date  . Anemia 2006  . Depression 2014   previously on amitryptiline   . Diabetes mellitus with neurological manifestation ([place]) 2006  . Diabetic peripheral neuropathy ([place]) 04/26/2020  . Fracture of left ankle 1997   . Gastroparesis 07/2016  . HOH (hard of hearing) 2004   . Hyperlipidemia 2006  . Hypertension 2006  . IBS (irritable bowel syndrome) 2002   . Leukopenia 2015   . Macular degeneration 11/2019  . Shingles 2009   . Stroke ([place]) 2020  . Thyroid nodule 2004    Past Surgical History:  Procedure Laterality Date  . ABDOMINAL HYSTERECTOMY  2005  . CATARACT EXTRACTION Left 11/2019  . CESAREAN SECTION  1983   . CHOLECYSTECTOMY N/A 03/05/2020   Procedure: LAPAROSCOPIC CHOLECYSTECTOMY WITH INTRAOPERATIVE CHOLANGIOGRAM;  Surgeon: Donnie Mesa, MD;  Location: [facility];  Service: General;  Laterality: N/A;  . ESOPHAGOGASTRODUODENOSCOPY (EGD) WITH PROPOFOL Left 08/26/2014   Procedure: ESOPHAGOGASTRODUODENOSCOPY (EGD) WITH PROPOFOL;  Surgeon: Arta Silence, MD;  Location: [facility];  Service: Endoscopy;  Laterality: Left;  . ESOPHAGOGASTRODUODENOSCOPY (EGD) WITH PROPOFOL N/A 03/03/2020   Procedure: ESOPHAGOGASTRODUODENOSCOPY (EGD) WITH PROPOFOL;  Surgeon: Jerene Bears, MD;  Location: [facility];  Service: Gastroenterology;  Laterality: N/A;    Family Psychiatric History: Brother bipolar disorder  Family History:  Family History  Problem Relation Age of Onset  . Hypertension Mother   . Heart disease Mother   . Diabetes Mother   . Thyroid disease Mother   . Congestive Heart Failure Mother   . Breast cancer Maternal Grandmother   . Colon cancer Maternal Grandfather   . Heart attack Sister   . Heart disease Brother   . Hyperlipidemia Brother   . Hypertension Brother   . Diabetes Father   . Breast cancer Maternal Aunt     Social History:   Social History    Socioeconomic History  . Marital status: Legally Separated    Spouse name: [name]  . Number of children: 1  . Years of education: some colle  . Highest education level: Not on file  Occupational History  . Occupation: Employed FT as Landscape architect    Comment: [employer] , NA  Tobacco Use  . Smoking status: Former Smoker    Packs/day: 0.25    Years: 0.50    Pack years: 0.12    Types: Cigarettes  . Smokeless tobacco: Never Used  . Tobacco comment: quit yrs ago  Vaping Use  . Vaping Use: Never used  Substance and Sexual Activity  . Alcohol use: No    Comment: occasion   . Drug use: No  . Sexual activity: Yes    Birth control/protection: None  Other Topics Concern  . Not on file  Social History Narrative   02/18/20 lives alone     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    Social Determinants of Health   Financial Resource Strain: Not on file  Food Insecurity: Not on file  Transportation Needs: Not on file  Physical Activity: Not on file  Stress: Not on file  Social Connections: Not on file    Additional Social History: Patient resides in [place] with her sister. She is married and she has one daughter. She is currently unemployed. She denies tobacco, alcohol, or illegal drug use.   Allergies:   Allergies  Allergen Reactions  . Elemental  Sulfur Hives  . Hydralazine Hcl     Hair loss  . Hydrocodone Other (See Comments)    Upset stomach, itching  . Metformin And Related Nausea And Vomiting and Other (See Comments)    Stomach pains   . Plaquenil [Hydroxychloroquine Sulfate] Hives    Metabolic Disorder Labs: Lab Results  Component Value Date   HGBA1C 7.2 (A) 07/05/2020   MPG 263.26 02/29/2020   MPG 335 07/23/2016   No results found for: PROLACTIN Lab Results  Component Value Date   CHOL 149 02/29/2020   TRIG 97 02/29/2020   HDL 49 02/29/2020   CHOLHDL 3.0 02/29/2020   VLDL 19 02/29/2020   LDLCALC 81 02/29/2020    LDLCALC 122 (H) 02/11/2020   Lab Results  Component Value Date   TSH 1.420 04/20/2020    Therapeutic Level Labs: No results found for: LITHIUM No results found for: CBMZ No results found for: VALPROATE  Current Medications: Current Outpatient Medications  Medication Sig Dispense Refill  . mirtazapine (REMERON) 15 MG tablet Take 1 tablet (15 mg total) by mouth at bedtime. 30 tablet 2  . acetaminophen (TYLENOL) 500 MG tablet Take 500 mg by mouth every 6 (six) hours as needed for moderate pain or headache.    Marland Kitchen amLODipine (NORVASC) 10 MG tablet Take 1 tablet (10 mg total) by mouth daily. 30 tablet 3  . aspirin EC 81 MG tablet Take 1 tablet (81 mg total) by mouth daily. 100 tablet 1  . atorvastatin (LIPITOR) 20 MG tablet Take 1 tablet (20 mg total) by mouth daily. 90 tablet 3  . Blood Glucose Monitoring Suppl (ONETOUCH VERIO) w/Device KIT Check blood sugar three times daily. E11.40 1 kit 0  . Blood Glucose Monitoring Suppl (TRUE METRIX METER) w/Device KIT Check blood sugar three times daily. E11.40 1 kit 0  . cyclobenzaprine (FLEXERIL) 5 MG tablet Take 5 mg by mouth daily as needed for muscle spasms.    . DULoxetine (CYMBALTA) 20 MG capsule Take 1 capsule (20 mg total) by mouth daily. 30 capsule 2  . ezetimibe (ZETIA) 10 MG tablet Take 10 mg by mouth daily.    Marland Kitchen gabapentin (NEURONTIN) 300 MG capsule Take 1 capsule (300 mg total) by mouth at bedtime. 30 capsule 2  . glucose blood test strip Use as instructedCheck blood sugar three times daily. E11.40 100 each 12  . insulin glargine (LANTUS) 100 UNIT/ML Solostar Pen Inject 58 Units into the skin at bedtime. 15 mL 3  . insulin lispro (HUMALOG KWIKPEN) 100 UNIT/ML KwikPen Inject 4 units before breakfast and dinner. 15 mL 3  . Insulin Pen Needle (PEN NEEDLES) 32G X 4 MM MISC 1 each by Does not apply route 4 (four) times daily. 120 each 11  . metoCLOPramide (REGLAN) 5 MG tablet Take 1 tablet (5 mg total) by mouth 3 (three) times daily before  meals. 90 tablet 1  . metoprolol tartrate (LOPRESSOR) 25 MG tablet Take 1 tablet (25 mg total) by mouth 2 (two) times daily. 120 tablet 3  . omeprazole (PRILOSEC) 20 MG capsule Take 1 capsule (20 mg total) by mouth daily. 30 capsule 1  . ondansetron (ZOFRAN) 4 MG tablet Take 1 tablet (4 mg total) by mouth every 8 (eight) hours as needed for nausea or vomiting. 30 tablet 0  . Propylene Glycol (SYSTANE COMPLETE OP) Apply 1 drop to eye daily as needed (dry eyes).    . risperiDONE (RISPERDAL) 3 MG tablet Take 1 tablet (3 mg total) by mouth  at bedtime. 30 tablet 2  . senna-docusate (SENOKOT S) 8.6-50 MG tablet Take 1 tablet by mouth daily as needed for moderate constipation. 30 tablet 2  . TRUEplus Lancets 28G MISC Check blood sugar three times daily. E11.40 100 each 11   No current facility-administered medications for this visit.    Musculoskeletal: Strength & Muscle Tone: Unable to assess due to telehealth visit [place]: Unable to assess due to telehealth visit Patient leans: N/A  Psychiatric Specialty Exam: Review of Systems  There were no vitals taken for this visit.There is no height or weight on file to calculate BMI.  General Appearance: Well Groomed  Eye Contact:  Good  Speech:  Clear and Coherent and Normal Rate  Volume:  Normal  Mood:  Anxious and Depressed  Affect:  Appropriate and Congruent  Thought Process:  Coherent, Goal Directed and Linear  Orientation:  Full (Time, Place, and Person)  Thought Content:  Logical and Hallucinations: Tactile  Suicidal Thoughts:  No  Homicidal Thoughts:  No  Memory:  Immediate;   Good Recent;   Good Remote;   Good  Judgement:  Good  Insight:  Good  Psychomotor Activity:  Normal  Concentration:  Concentration: Good and Attention Span: Good  Recall:  Good  Fund of Knowledge:Good  Language: Good  Akathisia:  No  Handed:  Right  AIMS (if indicated): Not done  Assets:  Communication Skills Desire for Improvement Financial  Resources/Insurance Housing Intimacy Leisure Time Social Support  ADL's:  Intact  Cognition: WNL  Sleep:  Fair   Screenings: GAD-7   Flowsheet Row Video Visit from 02/06/2021 in [facility] Office Visit from 07/05/2020 in [place] Office Visit from 05/06/2020 in [place] Office Visit from 02/09/2020 in [place] Office Visit from 08/27/2018 in [place]  Total GAD-7 Score $RemoveBef'12 5 6 7 7    'CuPXbhSDHP$ PHQ2-9   Flowsheet Row Video Visit from 02/06/2021 in [facility] Most recent reading at 02/06/2021 11:08 AM Counselor from 02/06/2021 in [facility] Most recent reading at 02/06/2021 10:15 AM Office Visit from 07/05/2020 in [place] Most recent reading at 07/05/2020 10:08 AM Office Visit from 05/06/2020 in [place] Most recent reading at 05/06/2020 11:16 AM Office Visit from 02/09/2020 in [place] Most recent reading at 02/09/2020  8:51 AM  PHQ-2 Total Score $RemoveBef'3 5 1 1 4  'sbQXJUIfAZ$ PHQ-9 Total Score $RemoveBef'16 12 3 'hzIhyFwODR$ -- 11    Flowsheet Row Video Visit from 02/06/2021 in [facility] Most recent reading at 02/06/2021 11:08 AM Counselor from 02/06/2021 in [facility] Most recent reading at 02/06/2021 10:19 AM  C-SSRS RISK CATEGORY Error: Q7 should not be populated when Q6 is No Low Risk      Assessment and Plan: Patient endorses symptoms of anxiety, depression, insomnia, and tactile hallucinations. She notes that Trazodone has been ineffective in managing her sleep. She is agreeable to discontinuing Trazodone and starting Mirtazapine 15 mg nightly to help manage, anxiety, depression, and sleep. She is also agreeable to increasing Risperdal 2 mg nightly to 3 mg nightly to help  manage tactile hallucinations. She will continue all other medications as prescribed.   1. Tactile hallucinations  Increased- risperiDONE (RISPERDAL) 3 MG tablet; Take 1 tablet (3 mg total) by  mouth at bedtime.  Dispense: 30 tablet; Refill: 2  2. Anxiety and depression  Start- mirtazapine (REMERON) 15 MG tablet; Take 1 tablet (15 mg total) by mouth at bedtime.  Dispense: 30 tablet; Refill: 2 Continue- DULoxetine (CYMBALTA) 20 MG capsule; Take 1 capsule (20 mg total) by mouth daily.  Dispense: 30 capsule; Refill: 2  Follow up in 3 months   Salley Slaughter, NP 2/21/202211:33 AM

## 2021-02-08 NOTE — Progress Notes (Signed)
Comprehensive Clinical Assessment (CCA) Note  02/08/2021 [name] [id number]  Chief Complaint:  Chief Complaint  Patient presents with  . Hallucinations   Visit Diagnosis: Anxiety State  CCA Biopsychosocial Intake/Chief Complaint:  "Eye hallucinations"  Current Symptoms/Problems: Pt reports she has "objects coming out of my eyes" States this started Oct 2021. She reports "an array of objects" and "spiders at it's worst".  Patient Reported Schizophrenia/Schizoaffective Diagnosis in Past: No  Strengths: Seeking help  Preferences: In person sessions, call her Nancy [name]  Type of Services Patient Feels are Needed: Counseling and med management  Initial Clinical Notes/Concerns: LCSW reviewed informed consent for counseling with pt's full acknowledgement. Pt states she has not participated in ongoing counseling before. Reports she was hospitalized when hallucintations began and has been on MH meds, Risperdal. Feels this is helpful, she states she needs refills on med and has med management appt following this session.   Mental Health Symptoms Depression:  None   Duration of Depressive symptoms: No data recorded  Mania:  None   Anxiety:   Tension; Worrying   Psychosis:  Hallucinations   Duration of Psychotic symptoms: Less than six months   Trauma:  None   Obsessions:  None   Compulsions:  None   Inattention:  Forgetful   Hyperactivity/Impulsivity:  N/A   Oppositional/Defiant Behaviors:  None   Emotional Irregularity:  Mood lability   Other Mood/Personality Symptoms:  intermittent panic with sob, heart racing, worse in crowds    Mental Status Exam Appearance and self-care  Stature:  Average   Weight:  Overweight   Clothing:  Casual   Grooming:  Normal   Cosmetic use:  None   Posture/gait:  Tense   Motor activity:  Not Remarkable   Sensorium  Attention:  Normal   Concentration:  Variable   Orientation:  X5   Recall/memory:  Defective in  Remote   Affect and Mood  Affect:  Anxious   Mood:  Anxious   Relating  Eye contact:  Normal   Facial expression:  Tense   Attitude toward examiner:  Cooperative   Thought and Language  Speech flow: Soft   Thought content:  Appropriate to Mood and Circumstances   Preoccupation:  Other (Comment) (Housing, Hallucinations)   Hallucinations:  Tactile; Visual   Organization:  No data recorded  Transport planner of Knowledge:  -- (Needs additional assessment)   Intelligence:  Needs investigation   Abstraction:  -- (Needs additional assessment)   Judgement:  -- (Needs additional assessment)   Reality Testing:  Variable   Insight:  Other (Comment) (Needs additional assessment)   Decision Making:  Vacilates   Social Functioning  Social Maturity:  Isolates   Social Judgement:  Normal   Stress  Stressors:  Illness; Financial; Housing   Coping Ability:  Exhausted   Skill Deficits:  Decision making   Supports:  Family; Friends/Service system     Religion: Religion/Spirituality Are You A Religious Person?: Yes What is Your Religious Affiliation?: Personal assistant: Leisure / Recreation Do You Have Hobbies?: No  Exercise/Diet: Exercise/Diet Do You Exercise?: No Have You Gained or Lost A Significant Amount of Weight in the Past Six Months?: No Do You Follow a Special Diet?: No Do You Have Any Trouble Sleeping?: Yes Explanation of Sleeping Difficulties: falling and staying asleep  CCA Employment/Education Employment/Work Situation: Employment / Work Situation Employment situation: Unemployed (Disability pending) Has patient ever been in the [employer]?: No  Education: Education Is Patient Currently Attending School?:  No Last Grade Completed: 12 Did You Graduate From [employer]?: Yes Did You Attend College?: Yes What Type of College Degree Do you Have?: 1.5 yrs college.  CCA Family/Childhood History Family and Relationship  History: Family history Marital status: Married Number of Years Married: 41 What types of issues is patient dealing with in the relationship?: Healthy but feel I am a strain on him. Additional relationship information: Husband currently staying with his cousin and pt living with sister after they lost their apartment 3 mon ago. Pt advises husband was laid off from work and she has no income so no money for rent. Pt is unsure how long she will be able to stay with her sister. Reports she is sleeping on the couch. Was at brother's but had to leave there as not on lease. Does patient have children?: Yes How many children?: 1 (one dtr) How is patient's relationship with their children?: Talk on phone, lives in [place]. "good"  Childhood History:  Childhood History By whom was/is the patient raised?: Mother Additional childhood history information: Little communication with dad as a child Description of patient's relationship with caregiver when they were a child: "good" Patient's description of current relationship with people who raised him/her: Mom died in 03-27-17, no help at this time with grief. Does patient have siblings?: Yes Number of Siblings: 3 Description of patient's current relationship with siblings: 2 sister, one brother. Lost oldest 03/27/00, Living with one of her sis and relationship "good" with brother. Did patient suffer any verbal/emotional/physical/sexual abuse as a child?: No Did patient suffer from severe childhood neglect?: No Has patient ever been sexually abused/assaulted/raped as an adolescent or adult?: No Witnessed domestic violence?: No Has patient been affected by domestic violence as an adult?: No  CCA Substance Use Alcohol/Drug Use: Alcohol / Drug Use History of alcohol / drug use?: No history of alcohol / drug abuse   DSM5 Diagnoses: Patient Active Problem List   Diagnosis Date Noted  . History of cerebrovascular accident (CVA) with residual deficit  05/06/2020  . Diabetic peripheral neuropathy ([place]) 04/26/2020  . ARF (acute renal failure) ([place]) 04/20/2020  . Generalized weakness 04/20/2020  . Dehydration 04/20/2020  . Generalized abdominal pain   . Pancreatitis, acute 02/29/2020  . Cerebrovascular accident (CVA) ([place]) 11/08/2019  . Stenosis of right carotid artery 11/08/2019  . Hyperlipidemia associated with type 2 diabetes mellitus ([place]) 11/08/2019  . Cortical age-related cataract of both eyes 08/30/2019  . Gait abnormality 08/30/2019  . Statin declined 08/30/2019  . Gastroesophageal reflux disease without esophagitis 04/18/2018  . 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 Uncontrolled type 2 diabetes mellitus with diabetic neuropathy, with long-term current use of insulin ([place]) 10/29/2017  . History of macular degeneration 10/29/2017  . Chronic cervical pain 10/29/2016  . Myalgia 09/14/2016  . Insomnia 09/14/2016  . Tinea pedis of both feet 08/20/2016  . Macular degeneration 08/17/2016  . Low back pain 09/21/2014  . Hypertension 09/21/2014  . Diabetic gastroparesis associated with type 2 diabetes mellitus ([place]) 09/14/2014  . Thyroid nodule 08/17/2014  . Obesity (BMI 30-39.9) 08/17/2014  . Hyperlipidemia 08/05/2014  . Nausea & vomiting 08/04/2014  . Type II diabetes mellitus with neurological manifestations, uncontrolled ([place]) 08/04/2014  . Elevated serum protein level 08/04/2014    Patient Centered Plan: Patient is on the following Treatment Plan(s):  Anxiety  Hermine Messick, LCSW

## 2021-02-22 ENCOUNTER — Ambulatory Visit: Payer: Self-pay | Admitting: *Deleted

## 2021-02-22 NOTE — Telephone Encounter (Signed)
  C/o shortness of breath periodically with exertion. No SOB at this time. Bilateral feet and legs swollen up to knees since this week. Reports abdomen swelling. Laying down causes coughing and shortness of breath. Light chest pain after coughing. Reports some wheezing at times. No distress or fever at this time. No available virtual My Chart appt until May. Instructed patient to legs . Care advise given. Patient verbalized understanding of care advise and to call back or go to [facility] or ED if symptoms worsen. Please advise if available appt can be made.   Reason for Disposition . [1] MILD difficulty breathing (e.g., minimal/no SOB at rest, SOB with walking, pulse <100) AND [2] NEW-onset or WORSE than normal  Answer Assessment - Initial Assessment Questions 1. RESPIRATORY STATUS: "Describe your breathing?" (e.g., wheezing, shortness of breath, unable to speak, severe coughing)      Shortness of breath periodically with exertion 2. ONSET: "When did this breathing problem begin?"      This week 3. PATTERN "Does the difficult breathing come and go, or has it been constant since it started?"      Comes and goes  4. SEVERITY: "How bad is your breathing?" (e.g., mild, moderate, severe)    - MILD: No SOB at rest, mild SOB with walking, speaks normally in sentences, can lay down, no retractions, pulse < 100.    - MODERATE: SOB at rest, SOB with minimal exertion and prefers to sit, cannot lie down flat, speaks in phrases, mild retractions, audible wheezing, pulse 100-120.    - SEVERE: Very SOB at rest, speaks in single words, struggling to breathe, sitting hunched forward, retractions, pulse > 120      Moderate  5. RECURRENT SYMPTOM: "Have you had difficulty breathing before?" If Yes, ask: "When was the last time?" and "What happened that time?"      Yes, couple of months ago . Does not know what happened 6. CARDIAC HISTORY: "Do you have any history of heart disease?" (e.g., heart attack, angina, bypass  surgery, angioplasty)      no 7. LUNG HISTORY: "Do you have any history of lung disease?"  (e.g., pulmonary embolus, asthma, emphysema)     Get bronchitis a lot  8. CAUSE: "What do you think is causing the breathing problem?"      Does not know  9. OTHER SYMPTOMS: "Do you have any other symptoms? (e.g., dizziness, runny nose, cough, chest pain, fever)     Cough, chest pain, bilateral feet and legs to knees swollen, stomach swelling noted  10. PREGNANCY: "Is there any chance you are pregnant?" "When was your last menstrual period?"       na 11. TRAVEL: "Have you traveled out of the country in the last month?" (e.g., travel history, exposures)       na  Protocols used: BREATHING DIFFICULTY-A-AH

## 2021-02-23 ENCOUNTER — Other Ambulatory Visit: Payer: Self-pay

## 2021-02-23 ENCOUNTER — Ambulatory Visit: Payer: Self-pay | Admitting: Physician Assistant

## 2021-02-23 VITALS — BP 184/84 | HR 84 | Temp 98.2°F | Resp 18 | Ht 63.0 in | Wt 216.0 lb

## 2021-02-23 DIAGNOSIS — E1140 Type 2 diabetes mellitus with diabetic neuropathy, unspecified: Secondary | ICD-10-CM

## 2021-02-23 DIAGNOSIS — R600 Localized edema: Secondary | ICD-10-CM

## 2021-02-23 DIAGNOSIS — IMO0002 Reserved for concepts with insufficient information to code with codable children: Secondary | ICD-10-CM

## 2021-02-23 DIAGNOSIS — Z794 Long term (current) use of insulin: Secondary | ICD-10-CM

## 2021-02-23 DIAGNOSIS — R0601 Orthopnea: Secondary | ICD-10-CM

## 2021-02-23 DIAGNOSIS — E1165 Type 2 diabetes mellitus with hyperglycemia: Secondary | ICD-10-CM

## 2021-02-23 DIAGNOSIS — I10 Essential (primary) hypertension: Secondary | ICD-10-CM

## 2021-02-23 DIAGNOSIS — R7989 Other specified abnormal findings of blood chemistry: Secondary | ICD-10-CM

## 2021-02-23 LAB — POCT GLYCOSYLATED HEMOGLOBIN (HGB A1C): Hemoglobin A1C: 6.8 % — AB (ref 4.0–5.6)

## 2021-02-23 NOTE — Patient Instructions (Signed)
I encourage you to take your blood pressure medications, check your blood pressure on a regular basis and keep a written log.  I encourage you to keep your feet elevated while at rest.  We will contact you with your lab results  Kennieth Rad, PA-C Physician Assistant [facility] [email or web address]    Edema  Edema is an abnormal buildup of fluids in the body tissues and under the skin. Swelling of the legs, feet, and ankles is a common symptom that becomes more likely as you get older. Swelling is also common in looser tissues, like around the eyes. When the affected area is squeezed, the fluid may move out of that spot and leave a dent for a few moments. This dent is called pitting edema. There are many possible causes of edema. Eating too much salt (sodium) and being on your feet or sitting for a long time can cause edema in your legs, feet, and ankles. Hot weather may make edema worse. Common causes of edema include:  Heart failure.  Liver or kidney disease.  Weak leg blood vessels.  Cancer.  An injury.  Pregnancy.  Medicines.  Being obese.  Low protein levels in the blood. Edema is usually painless. Your skin may look swollen or shiny. Follow these instructions at home:  Keep the affected body part raised (elevated) above the level of your heart when you are sitting or lying down.  Do not sit still or stand for long periods of time.  Do not wear tight clothing. Do not wear garters on your upper legs.  Exercise your legs to get your circulation going. This helps to move the fluid back into your blood vessels, and it may help the swelling go down.  Wear elastic bandages or support stockings to reduce swelling as told by your health care provider.  Eat a low-salt (low-sodium) diet to reduce fluid as told by your health care provider.  Depending on the cause of your swelling, you may need to limit how much  fluid you drink (fluid restriction).  Take over-the-counter and prescription medicines only as told by your health care provider. Contact a health care provider if:  Your edema does not get better with treatment.  You have heart, liver, or kidney disease and have symptoms of edema.  You have sudden and unexplained weight gain. Get help right away if:  You develop shortness of breath or chest pain.  You cannot breathe when you lie down.  You develop pain, redness, or warmth in the swollen areas.  You have heart, liver, or kidney disease and suddenly get edema.  You have a fever and your symptoms suddenly get worse. Summary  Edema is an abnormal buildup of fluids in the body tissues and under the skin.  Eating too much salt (sodium) and being on your feet or sitting for a long time can cause edema in your legs, feet, and ankles.  Keep the affected body part raised (elevated) above the level of your heart when you are sitting or lying down. This information is not intended to replace advice given to you by your health care provider. Make sure you discuss any questions you have with your health care provider. Document Revised: 04/22/2019 Document Reviewed: 01/05/2017 Elsevier Patient Education  2021 [employer].

## 2021-02-23 NOTE — Progress Notes (Signed)
Patient shares she forgot to take her BP medication prior to a morning appointment she had scheduled. Patient has eaten today. Patient reports bilateral leg swelling and bloating for the past week and a half. Patient reports wheezing at night and coughing. Patient reports using an old inhaler to help with the concern which is almost out.

## 2021-02-23 NOTE — Telephone Encounter (Signed)
Called patient and offered soonest available appointment with one of our providers not until April 14th. Patient asked if I had anything sooner and I advised her of the mobile medicine address for today 3/10. Patient stated she would likely go this afternoon.

## 2021-02-23 NOTE — Progress Notes (Signed)
Established Patient Office Visit  Subjective:  Patient ID: Nancy [name], female    DOB: Oct 08, 1961  Age: 61 y.o. MRN: [id number]  CC:  Chief Complaint  Patient presents with  . Edema    HPI YAILIN BIEDERMAN states that she has been been having swelling in both feet and stomach for the past week; states that she is getting SOB with exertion and is having difficulty breathing when laying down, states using extra pillows does offer some relief .  Does not check BP at home, has not taken BP meds today   Past Medical History:  Diagnosis Date  . Anemia 2006  . Depression 2014   previously on amitryptiline   . Diabetes mellitus with neurological manifestation ([place]) 2006  . Diabetic peripheral neuropathy ([place]) 04/26/2020  . Fracture of left ankle 1997   . Gastroparesis 07/2016  . HOH (hard of hearing) 2004   . Hyperlipidemia 2006  . Hypertension 2006  . IBS (irritable bowel syndrome) 2002   . Leukopenia 2015   . Macular degeneration 11/2019  . Shingles 2009   . Stroke ([place]) 2020  . Thyroid nodule 2004    Past Surgical History:  Procedure Laterality Date  . ABDOMINAL HYSTERECTOMY  2005  . CATARACT EXTRACTION Left 11/2019  . CESAREAN SECTION  1983   . CHOLECYSTECTOMY N/A 03/05/2020   Procedure: LAPAROSCOPIC CHOLECYSTECTOMY WITH INTRAOPERATIVE CHOLANGIOGRAM;  Surgeon: Donnie Mesa, MD;  Location: [facility];  Service: General;  Laterality: N/A;  . ESOPHAGOGASTRODUODENOSCOPY (EGD) WITH PROPOFOL Left 08/26/2014   Procedure: ESOPHAGOGASTRODUODENOSCOPY (EGD) WITH PROPOFOL;  Surgeon: Arta Silence, MD;  Location: [facility];  Service: Endoscopy;  Laterality: Left;  . ESOPHAGOGASTRODUODENOSCOPY (EGD) WITH PROPOFOL N/A 03/03/2020   Procedure: ESOPHAGOGASTRODUODENOSCOPY (EGD) WITH PROPOFOL;  Surgeon: Jerene Bears, MD;  Location: [facility];  Service: Gastroenterology;  Laterality: N/A;    Family History  Problem Relation Age of Onset  . Hypertension Mother   . Heart  disease Mother   . Diabetes Mother   . Thyroid disease Mother   . Congestive Heart Failure Mother   . Breast cancer Maternal Grandmother   . Colon cancer Maternal Grandfather   . Heart attack Sister   . Heart disease Brother   . Hyperlipidemia Brother   . Hypertension Brother   . Diabetes Father   . Breast cancer Maternal Aunt     Social History   Socioeconomic History  . Marital status: Legally Separated    Spouse name: [name]  . Number of children: 1  . Years of education: some colle  . Highest education level: Not on file  Occupational History  . Occupation: Employed FT as Landscape architect    Comment: [employer] , NA  Tobacco Use  . Smoking status: Former Smoker    Packs/day: 0.25    Years: 0.50    Pack years: 0.12    Types: Cigarettes  . Smokeless tobacco: Never Used  . Tobacco comment: quit yrs ago  Vaping Use  . Vaping Use: Never used  Substance and Sexual Activity  . Alcohol use: No    Comment: occasion   . Drug use: No  . Sexual activity: Yes    Birth control/protection: None  Other Topics Concern  . Not on file  Social History Narrative   02/18/20 lives alone     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    Social Determinants of Health   Financial Resource Strain: Not on file  Food Insecurity:  Not on file  Transportation Needs: Not on file  Physical Activity: Not on file  Stress: Not on file  Social Connections: Not on file  Intimate Partner Violence: Not on file    Outpatient Medications Prior to Visit  Medication Sig Dispense Refill  . acetaminophen (TYLENOL) 500 MG tablet Take 500 mg by mouth every 6 (six) hours as needed for moderate pain or headache.    Marland Kitchen amLODipine (NORVASC) 10 MG tablet Take 1 tablet (10 mg total) by mouth daily. 30 tablet 3  . aspirin EC 81 MG tablet Take 1 tablet (81 mg total) by mouth daily. 100 tablet 1  . atorvastatin (LIPITOR) 20 MG tablet Take 1 tablet (20 mg total) by mouth daily. 90  tablet 3  . Blood Glucose Monitoring Suppl (ONETOUCH VERIO) w/Device KIT Check blood sugar three times daily. E11.40 1 kit 0  . Blood Glucose Monitoring Suppl (TRUE METRIX METER) w/Device KIT Check blood sugar three times daily. E11.40 1 kit 0  . cyclobenzaprine (FLEXERIL) 5 MG tablet Take 5 mg by mouth daily as needed for muscle spasms.    . DULoxetine (CYMBALTA) 20 MG capsule Take 1 capsule (20 mg total) by mouth daily. 30 capsule 2  . ezetimibe (ZETIA) 10 MG tablet Take 10 mg by mouth daily.    Marland Kitchen gabapentin (NEURONTIN) 300 MG capsule Take 1 capsule (300 mg total) by mouth at bedtime. 30 capsule 2  . glucose blood test strip Use as instructedCheck blood sugar three times daily. E11.40 100 each 12  . insulin glargine (LANTUS) 100 UNIT/ML Solostar Pen Inject 58 Units into the skin at bedtime. 15 mL 3  . insulin lispro (HUMALOG KWIKPEN) 100 UNIT/ML KwikPen Inject 4 units before breakfast and dinner. 15 mL 3  . Insulin Pen Needle (PEN NEEDLES) 32G X 4 MM MISC 1 each by Does not apply route 4 (four) times daily. 120 each 11  . metoCLOPramide (REGLAN) 5 MG tablet Take 1 tablet (5 mg total) by mouth 3 (three) times daily before meals. 90 tablet 1  . metoprolol tartrate (LOPRESSOR) 25 MG tablet Take 1 tablet (25 mg total) by mouth 2 (two) times daily. 120 tablet 3  . mirtazapine (REMERON) 15 MG tablet Take 1 tablet (15 mg total) by mouth at bedtime. 30 tablet 2  . omeprazole (PRILOSEC) 20 MG capsule Take 1 capsule (20 mg total) by mouth daily. 30 capsule 1  . ondansetron (ZOFRAN) 4 MG tablet Take 1 tablet (4 mg total) by mouth every 8 (eight) hours as needed for nausea or vomiting. 30 tablet 0  . Propylene Glycol (SYSTANE COMPLETE OP) Apply 1 drop to eye daily as needed (dry eyes).    . risperiDONE (RISPERDAL) 3 MG tablet Take 1 tablet (3 mg total) by mouth at bedtime. 30 tablet 2  . senna-docusate (SENOKOT S) 8.6-50 MG tablet Take 1 tablet by mouth daily as needed for moderate constipation. 30 tablet 2   . TRUEplus Lancets 28G MISC Check blood sugar three times daily. E11.40 100 each 11   No facility-administered medications prior to visit.    Allergies  Allergen Reactions  . Elemental Sulfur Hives  . Hydralazine Hcl     Hair loss  . Hydrocodone Other (See Comments)    Upset stomach, itching  . Metformin And Related Nausea And Vomiting and Other (See Comments)    Stomach pains   . Plaquenil [Hydroxychloroquine Sulfate] Hives    ROS Review of Systems  Constitutional: Negative for chills and fever.  HENT: Negative.   Eyes: Negative.   Respiratory: Positive for shortness of breath. Negative for cough and wheezing.   Cardiovascular: Negative for chest pain and palpitations.  Gastrointestinal: Positive for abdominal distention. Negative for abdominal pain, nausea and vomiting.  Endocrine: Negative.   Genitourinary: Negative.   Musculoskeletal: Negative.   Skin: Negative.   Allergic/Immunologic: Negative.   Neurological: Negative for dizziness, syncope, weakness and headaches.  Hematological: Negative.   Psychiatric/Behavioral: Negative.       Objective:    Physical Exam Vitals and nursing note reviewed.  Constitutional:      General: She is not in acute distress.    Appearance: Normal appearance. She is not ill-appearing.  HENT:     Head: Normocephalic and atraumatic.     Right Ear: External ear normal.     Left Ear: External ear normal.     Nose: Nose normal.     Mouth/Throat:     Mouth: Mucous membranes are moist.     Pharynx: Oropharynx is clear.  Eyes:     Conjunctiva/sclera: Conjunctivae normal.     Pupils: Pupils are equal, round, and reactive to light.  Cardiovascular:     Rate and Rhythm: Normal rate and regular rhythm.     Pulses: Normal pulses.     Heart sounds: Normal heart sounds.  Pulmonary:     Effort: Pulmonary effort is normal.     Breath sounds: Normal breath sounds. No wheezing.  Abdominal:     General: Bowel sounds are normal. There is  distension.     Palpations: Abdomen is soft.     Tenderness: There is no abdominal tenderness.  Musculoskeletal:     Cervical back: Normal range of motion and neck supple.     Right lower leg: 1+ Edema present.     Left lower leg: 1+ Edema present.  Skin:    General: Skin is warm and dry.  Neurological:     General: No focal deficit present.     Mental Status: She is alert and oriented to person, place, and time.  Psychiatric:        Mood and Affect: Mood normal.        Behavior: Behavior normal.        Thought Content: Thought content normal.        Judgment: Judgment normal.     BP (!) 184/84 (BP Location: Left Arm, Patient Position: Sitting, Cuff Size: Large)   Pulse 84   Temp 98.2 F (36.8 C) (Oral)   Resp 18   Ht 5' 3" (1.6 m)   Wt 216 lb (98 kg)   SpO2 95%   BMI 38.26 kg/m  Wt Readings from Last 3 Encounters:  02/23/21 216 lb (98 kg)  07/05/20 199 lb (90.3 kg)  05/06/20 195 lb 6.4 oz (88.6 kg)     Health Maintenance Due  Topic Date Due  . Hepatitis C Screening  Never done  . PNEUMOCOCCAL POLYSACCHARIDE VACCINE AGE 44-64 HIGH RISK  Never done  . COVID-19 Vaccine (1) Never done  . TETANUS/TDAP  Never done  . COLONOSCOPY (Pts 45-97yr Insurance coverage will need to be confirmed)  Never done  . FOOT EXAM  10/29/2018  . INFLUENZA VACCINE  07/17/2020  . OPHTHALMOLOGY EXAM  11/17/2020  . URINE MICROALBUMIN  02/10/2021    There are no preventive care reminders to display for this patient.  Lab Results  Component Value Date   TSH 1.420 04/20/2020   Lab Results  Component Value  Date   WBC 9.5 02/23/2021   HGB 9.2 (L) 02/23/2021   HCT 28.9 (L) 02/23/2021   MCV 89 02/23/2021   PLT 265 02/23/2021   Lab Results  Component Value Date   NA 140 02/23/2021   K 4.5 02/23/2021   CO2 25 05/06/2020   GLUCOSE 274 (H) 02/23/2021   BUN 27 02/23/2021   CREATININE 1.90 (H) 02/23/2021   BILITOT 0.3 02/23/2021   ALKPHOS 71 02/23/2021   AST 11 02/23/2021   ALT 15  04/21/2020   PROT 7.2 02/23/2021   ALBUMIN 4.0 02/23/2021   CALCIUM 9.0 02/23/2021   ANIONGAP 7 04/22/2020   Lab Results  Component Value Date   CHOL 149 02/29/2020   Lab Results  Component Value Date   HDL 49 02/29/2020   Lab Results  Component Value Date   LDLCALC 81 02/29/2020   Lab Results  Component Value Date   TRIG 97 02/29/2020   Lab Results  Component Value Date   CHOLHDL 3.0 02/29/2020   Lab Results  Component Value Date   HGBA1C 6.8 (A) 02/23/2021      Assessment & Plan:   Problem List Items Addressed This Visit      Cardiovascular and Mediastinum   Hypertension (Chronic)     Endocrine   Uncontrolled type 2 diabetes mellitus with diabetic neuropathy, with long-term current use of insulin (HCC) (Chronic)   Relevant Orders   HgB A1c (Completed)     Other   Localized edema - Primary   Relevant Orders   CBC with Differential/Platelet (Completed)   Brain natriuretic peptide (Completed)   Comp. Metabolic Panel (12) (Completed)   Orthopnea   Relevant Orders   CBC with Differential/Platelet (Completed)   Brain natriuretic peptide (Completed)   Comp. Metabolic Panel (12) (Completed)    1. Localized edema Patient education given on low-sodium diet, keeping feet elevated at rest.  On review of patient's chart, patient had echocardiogram completed December 2020 that was within normal limits.  Consider low-dose Lasix if appropriate.  - CBC wDifferential/Platelet - Brain natriuretic peptide - Comp. Metabolic Panel (12)  2. Orthopnea  - CBC with Differential/Platelet - Brain natriuretic peptide - Comp. Metabolic Panel (12)  3. Uncontrolled type 2 diabetes mellitus with diabetic neuropathy, with long-term current use of insulin (HCC) A1c 6.8 - HgB A1c  4. Primary hypertension Patient encouraged to continue checking blood pressure at home, keep a written log and have available for all office visits   I have reviewed the patient's medical history  (PMH, PSH, Social History, Family History, Medications, and allergies) , and have been updated if relevant. I spent 30 minutes reviewing chart and  face to face time with patient. Pick up the patient's     No orders of the defined types were placed in this encounter.   Follow-up: Return if symptoms worsen or fail to improve.    Loraine Grip Nevaen Tredway, PA-C

## 2021-02-25 ENCOUNTER — Other Ambulatory Visit: Payer: Self-pay | Admitting: Physician Assistant

## 2021-02-25 DIAGNOSIS — R600 Localized edema: Secondary | ICD-10-CM | POA: Insufficient documentation

## 2021-02-25 DIAGNOSIS — R0601 Orthopnea: Secondary | ICD-10-CM | POA: Insufficient documentation

## 2021-02-25 LAB — COMP. METABOLIC PANEL (12)
AST: 11 IU/L (ref 0–40)
Albumin/Globulin Ratio: 1.3 (ref 1.2–2.2)
Albumin: 4 g/dL (ref 3.8–4.9)
Alkaline Phosphatase: 71 IU/L (ref 44–121)
BUN/Creatinine Ratio: 14 (ref 12–28)
BUN: 27 mg/dL (ref 8–27)
Bilirubin Total: 0.3 mg/dL (ref 0.0–1.2)
Calcium: 9 mg/dL (ref 8.7–10.3)
Chloride: 103 mmol/L (ref 96–106)
Creatinine, Ser: 1.9 mg/dL — ABNORMAL HIGH (ref 0.57–1.00)
Globulin, Total: 3.2 g/dL (ref 1.5–4.5)
Glucose: 274 mg/dL — ABNORMAL HIGH (ref 65–99)
Potassium: 4.5 mmol/L (ref 3.5–5.2)
Sodium: 140 mmol/L (ref 134–144)
Total Protein: 7.2 g/dL (ref 6.0–8.5)
eGFR: 30 mL/min/{1.73_m2} — ABNORMAL LOW (ref >59)

## 2021-02-25 LAB — CBC WITH DIFFERENTIAL/PLATELET
Basophils Absolute: 0.1 10*3/uL (ref 0.0–0.2)
Basos: 1 %
EOS (ABSOLUTE): 0.5 10*3/uL — ABNORMAL HIGH (ref 0.0–0.4)
Eos: 5 %
Hematocrit: 28.9 % — ABNORMAL LOW (ref 34.0–46.6)
Hemoglobin: 9.2 g/dL — ABNORMAL LOW (ref 11.1–15.9)
Immature Grans (Abs): 0 10*3/uL (ref 0.0–0.1)
Immature Granulocytes: 0 %
Lymphocytes Absolute: 2.5 10*3/uL (ref 0.7–3.1)
Lymphs: 27 %
MCH: 28.4 pg (ref 26.6–33.0)
MCHC: 31.8 g/dL (ref 31.5–35.7)
MCV: 89 fL (ref 79–97)
Monocytes Absolute: 0.7 10*3/uL (ref 0.1–0.9)
Monocytes: 7 %
Neutrophils Absolute: 5.7 10*3/uL (ref 1.4–7.0)
Neutrophils: 60 %
Platelets: 265 10*3/uL (ref 150–450)
RBC: 3.24 x10E6/uL — ABNORMAL LOW (ref 3.77–5.28)
RDW: 12.9 % (ref 11.7–15.4)
WBC: 9.5 10*3/uL (ref 3.4–10.8)

## 2021-02-25 LAB — BRAIN NATRIURETIC PEPTIDE: BNP: 321.8 pg/mL — ABNORMAL HIGH (ref 0.0–100.0)

## 2021-02-25 MED ORDER — FUROSEMIDE 20 MG PO TABS: 20.0000 mg | ORAL_TABLET | Freq: Every day | ORAL | 0 refills | Status: DC

## 2021-02-25 NOTE — Addendum Note (Signed)
Addended by: Kennieth Rad on: 02/25/2021 08:29 PM   Modules accepted: Orders

## 2021-02-27 ENCOUNTER — Telehealth: Payer: Self-pay | Admitting: Internal Medicine

## 2021-02-27 NOTE — Telephone Encounter (Signed)
Patient verified DOB Patient is aware of mild congestive heart failure being noted on her blood work. Patient advised to take low dose lasix and look out for a phone call from cardiology. Patient is returning to [facility] on 02/27/21 AT 11AM.

## 2021-02-27 NOTE — Telephone Encounter (Signed)
Pt was seen by Cari on 3/10. Please advise

## 2021-02-27 NOTE — Telephone Encounter (Signed)
Copied from [place] [id number]. Topic: General - Other >> Feb 27, 2021 10:43 AM Yvette Rack wrote: Reason for CRM: Pt request update on most recent lab results. Pt stated she can not access [facility]

## 2021-03-01 ENCOUNTER — Other Ambulatory Visit: Payer: Self-pay

## 2021-03-01 ENCOUNTER — Other Ambulatory Visit: Payer: Self-pay | Admitting: Physician Assistant

## 2021-03-01 ENCOUNTER — Ambulatory Visit: Payer: Self-pay | Admitting: Physician Assistant

## 2021-03-01 VITALS — BP 191/97 | HR 85 | Temp 98.7°F | Resp 18 | Ht 62.0 in

## 2021-03-01 DIAGNOSIS — R600 Localized edema: Secondary | ICD-10-CM

## 2021-03-01 DIAGNOSIS — R7989 Other specified abnormal findings of blood chemistry: Secondary | ICD-10-CM

## 2021-03-01 MED ORDER — FUROSEMIDE 20 MG PO TABS
20.0000 mg | ORAL_TABLET | Freq: Every day | ORAL | 0 refills | Status: DC
Start: 2021-03-01 — End: 2021-03-07

## 2021-03-01 NOTE — Progress Notes (Signed)
 Established Patient Office Visit  Subjective:  Patient ID: Nancy [name], female    DOB: 12/16/1961  Age: 60 y.o. MRN: [id number]  CC:  Chief Complaint  Patient presents with  . Follow-up    CHF    HPI Araseli H Lando reports that she has been taking the Lasix on a daily basis, today was her third dose.  Reports that she does feel that the swelling in her feet has improved, breathing is much improved.  Reports that she has not been checking her blood pressure at home, does not have access to a scale.  Reports that she is currently living in a hotel.    Past Medical History:  Diagnosis Date  . Anemia 2006  . Depression 2014   previously on amitryptiline   . Diabetes mellitus with neurological manifestation (HCC) 2006  . Diabetic peripheral neuropathy (HCC) 04/26/2020  . Fracture of left ankle 1997   . Gastroparesis 07/2016  . HOH (hard of hearing) 2004   . Hyperlipidemia 2006  . Hypertension 2006  . IBS (irritable bowel syndrome) 2002   . Leukopenia 2015   . Macular degeneration 11/2019  . Shingles 2009   . Stroke (HCC) 2020  . Thyroid nodule 2004    Past Surgical History:  Procedure Laterality Date  . ABDOMINAL HYSTERECTOMY  2005  . CATARACT EXTRACTION Left 11/2019  . CESAREAN SECTION  1983   . CHOLECYSTECTOMY N/A 03/05/2020   Procedure: LAPAROSCOPIC CHOLECYSTECTOMY WITH INTRAOPERATIVE CHOLANGIOGRAM;  Surgeon: Tsuei, Matthew, MD;  Location: [facility];  Service: General;  Laterality: N/A;  . ESOPHAGOGASTRODUODENOSCOPY (EGD) WITH PROPOFOL Left 08/26/2014   Procedure: ESOPHAGOGASTRODUODENOSCOPY (EGD) WITH PROPOFOL;  Surgeon: William Outlaw, MD;  Location: [facility];  Service: Endoscopy;  Laterality: Left;  . ESOPHAGOGASTRODUODENOSCOPY (EGD) WITH PROPOFOL N/A 03/03/2020   Procedure: ESOPHAGOGASTRODUODENOSCOPY (EGD) WITH PROPOFOL;  Surgeon: Pyrtle, Jay M, MD;  Location: [facility];  Service: Gastroenterology;  Laterality: N/A;    Family History  Problem  Relation Age of Onset  . Hypertension Mother   . Heart disease Mother   . Diabetes Mother   . Thyroid disease Mother   . Congestive Heart Failure Mother   . Breast cancer Maternal Grandmother   . Colon cancer Maternal Grandfather   . Heart attack Sister   . Heart disease Brother   . Hyperlipidemia Brother   . Hypertension Brother   . Diabetes Father   . Breast cancer Maternal Aunt     Social History   Socioeconomic History  . Marital status: Legally Separated    Spouse name: [name]  . Number of children: 1  . Years of education: some colle  . Highest education level: Not on file  Occupational History  . Occupation: Employed FT as data analyst    Comment: [employer] , NA  Tobacco Use  . Smoking status: Former Smoker    Packs/day: 0.25    Years: 0.50    Pack years: 0.12    Types: Cigarettes  . Smokeless tobacco: Never Used  . Tobacco comment: quit yrs ago  Vaping Use  . Vaping Use: Never used  Substance and Sexual Activity  . Alcohol use: No    Comment: occasion   . Drug use: No  . Sexual activity: Yes    Birth control/protection: None  Other Topics Concern  . Not on file  Social History Narrative   02/18/20 lives alone     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      Social Determinants of Health   Financial Resource Strain: Not on file  Food Insecurity: Not on file  Transportation Needs: Not on file  Physical Activity: Not on file  Stress: Not on file  Social Connections: Not on file  Intimate Partner Violence: Not on file    Outpatient Medications Prior to Visit  Medication Sig Dispense Refill  . acetaminophen (TYLENOL) 500 MG tablet Take 500 mg by mouth every 6 (six) hours as needed for moderate pain or headache.    Marland Kitchen aspirin EC 81 MG tablet Take 1 tablet (81 mg total) by mouth daily. 100 tablet 1  . atorvastatin (LIPITOR) 20 MG tablet Take 1 tablet (20 mg total) by mouth daily. 90 tablet 3  . Blood Glucose Monitoring  Suppl (ONETOUCH VERIO) w/Device KIT Check blood sugar three times daily. E11.40 1 kit 0  . Blood Glucose Monitoring Suppl (TRUE METRIX METER) w/Device KIT Check blood sugar three times daily. E11.40 1 kit 0  . cyclobenzaprine (FLEXERIL) 5 MG tablet Take 5 mg by mouth daily as needed for muscle spasms.    . DULoxetine (CYMBALTA) 20 MG capsule Take 1 capsule (20 mg total) by mouth daily. 30 capsule 2  . ezetimibe (ZETIA) 10 MG tablet Take 10 mg by mouth daily.    Marland Kitchen gabapentin (NEURONTIN) 300 MG capsule Take 1 capsule (300 mg total) by mouth at bedtime. 30 capsule 2  . glucose blood test strip Use as instructedCheck blood sugar three times daily. E11.40 100 each 12  . insulin glargine (LANTUS) 100 UNIT/ML Solostar Pen Inject 58 Units into the skin at bedtime. 15 mL 3  . insulin lispro (HUMALOG KWIKPEN) 100 UNIT/ML KwikPen Inject 4 units before breakfast and dinner. 15 mL 3  . Insulin Pen Needle (PEN NEEDLES) 32G X 4 MM MISC 1 each by Does not apply route 4 (four) times daily. 120 each 11  . metoCLOPramide (REGLAN) 5 MG tablet Take 1 tablet (5 mg total) by mouth 3 (three) times daily before meals. 90 tablet 1  . metoprolol tartrate (LOPRESSOR) 25 MG tablet Take 1 tablet (25 mg total) by mouth 2 (two) times daily. 120 tablet 3  . mirtazapine (REMERON) 15 MG tablet Take 1 tablet (15 mg total) by mouth at bedtime. 30 tablet 2  . omeprazole (PRILOSEC) 20 MG capsule Take 1 capsule (20 mg total) by mouth daily. 30 capsule 1  . ondansetron (ZOFRAN) 4 MG tablet Take 1 tablet (4 mg total) by mouth every 8 (eight) hours as needed for nausea or vomiting. 30 tablet 0  . Propylene Glycol (SYSTANE COMPLETE OP) Apply 1 drop to eye daily as needed (dry eyes).    . risperiDONE (RISPERDAL) 3 MG tablet Take 1 tablet (3 mg total) by mouth at bedtime. 30 tablet 2  . senna-docusate (SENOKOT S) 8.6-50 MG tablet Take 1 tablet by mouth daily as needed for moderate constipation. 30 tablet 2  . TRUEplus Lancets 28G MISC Check  blood sugar three times daily. E11.40 100 each 11  . furosemide (LASIX) 20 MG tablet Take 1 tablet (20 mg total) by mouth daily. 10 tablet 0  . amLODipine (NORVASC) 10 MG tablet Take 1 tablet (10 mg total) by mouth daily. (Patient not taking: Reported on 03/01/2021) 30 tablet 3   No facility-administered medications prior to visit.    Allergies  Allergen Reactions  . Elemental Sulfur Hives  . Hydralazine Hcl     Hair loss  . Hydrocodone Other (See Comments)    Upset stomach,  itching  . Metformin And Related Nausea And Vomiting and Other (See Comments)    Stomach pains   . Plaquenil [Hydroxychloroquine Sulfate] Hives    ROS Review of Systems  Constitutional: Negative for fever.  HENT: Negative.   Eyes: Negative.   Respiratory: Negative for shortness of breath.   Cardiovascular: Positive for leg swelling. Negative for chest pain.  Endocrine: Negative.   Genitourinary: Negative.   Skin: Negative.   Allergic/Immunologic: Negative.   Neurological: Negative.   Hematological: Negative.   Psychiatric/Behavioral: Negative.       Objective:    Physical Exam Vitals and nursing note reviewed.  Constitutional:      General: She is not in acute distress.    Appearance: Normal appearance. She is not ill-appearing.  HENT:     Head: Normocephalic and atraumatic.     Right Ear: External ear normal.     Left Ear: External ear normal.     Nose: Nose normal.     Mouth/Throat:     Mouth: Mucous membranes are moist.     Pharynx: Oropharynx is clear.  Eyes:     Extraocular Movements: Extraocular movements intact.     Conjunctiva/sclera: Conjunctivae normal.     Pupils: Pupils are equal, round, and reactive to light.  Cardiovascular:     Rate and Rhythm: Normal rate and regular rhythm.     Pulses: Normal pulses.     Heart sounds: Normal heart sounds.  Pulmonary:     Effort: Pulmonary effort is normal.     Breath sounds: Normal breath sounds.  Abdominal:     General: Abdomen is  flat.  Musculoskeletal:     Cervical back: Normal range of motion and neck supple.     Right lower leg: 1+ Edema present.     Left lower leg: 1+ Edema present.  Neurological:     General: No focal deficit present.     Mental Status: She is alert and oriented to person, place, and time.  Psychiatric:        Mood and Affect: Mood normal.        Behavior: Behavior normal.        Thought Content: Thought content normal.        Judgment: Judgment normal.     BP (!) 191/97 (BP Location: Left Arm, Patient Position: Sitting, Cuff Size: Normal)   Pulse 85   Temp 98.7 F (37.1 C) (Oral)   Resp 18   Ht 5' 2" (1.575 m)   SpO2 96%   BMI 39.51 kg/m  Wt Readings from Last 3 Encounters:  02/23/21 216 lb (98 kg)  07/05/20 199 lb (90.3 kg)  05/06/20 195 lb 6.4 oz (88.6 kg)     Health Maintenance Due  Topic Date Due  . Hepatitis C Screening  Never done  . PNEUMOCOCCAL POLYSACCHARIDE VACCINE AGE 2-64 HIGH RISK  Never done  . COVID-19 Vaccine (1) Never done  . TETANUS/TDAP  Never done  . COLONOSCOPY (Pts 45-49yrs Insurance coverage will need to be confirmed)  Never done  . FOOT EXAM  10/29/2018  . INFLUENZA VACCINE  07/17/2020  . OPHTHALMOLOGY EXAM  11/17/2020  . URINE MICROALBUMIN  02/10/2021    There are no preventive care reminders to display for this patient.  Lab Results  Component Value Date   TSH 1.420 04/20/2020   Lab Results  Component Value Date   WBC 9.5 02/23/2021   HGB 9.2 (L) 02/23/2021   HCT 28.9 (L) 02/23/2021     MCV 89 02/23/2021   PLT 265 02/23/2021   Lab Results  Component Value Date   NA 141 03/01/2021   K 4.3 03/01/2021   CO2 20 03/01/2021   GLUCOSE 165 (H) 03/01/2021   BUN 32 (H) 03/01/2021   CREATININE 2.15 (H) 03/01/2021   BILITOT 0.3 02/23/2021   ALKPHOS 71 02/23/2021   AST 11 02/23/2021   ALT 15 04/21/2020   PROT 7.2 02/23/2021   ALBUMIN 4.0 02/23/2021   CALCIUM 8.8 03/01/2021   ANIONGAP 7 04/22/2020   Lab Results  Component Value Date    CHOL 149 02/29/2020   Lab Results  Component Value Date   HDL 49 02/29/2020   Lab Results  Component Value Date   LDLCALC 81 02/29/2020   Lab Results  Component Value Date   TRIG 97 02/29/2020   Lab Results  Component Value Date   CHOLHDL 3.0 02/29/2020   Lab Results  Component Value Date   HGBA1C 6.8 (A) 02/23/2021      Assessment & Plan:   Problem List Items Addressed This Visit      Other   Localized edema   Relevant Medications   furosemide (LASIX) 20 MG tablet   Elevated brain natriuretic peptide (BNP) level - Primary   Relevant Medications   furosemide (LASIX) 20 MG tablet   Other Relevant Orders   Basic Metabolic Panel (Completed)   Brain natriuretic peptide     1. Elevated brain natriuretic peptide (BNP) level Encouraged patient to keep appointment with cardiology, she has appointment on March 07, 2021 for further evaluation of new onset heart failure.  Patient education given on lifestyle modifications, strongly encouraged checking blood pressure on a daily basis, checking weights, lowering sodium intake.  Red flags given for prompt reevaluation - Basic Metabolic Panel - furosemide (LASIX) 20 MG tablet; Take 1 tablet (20 mg total) by mouth daily.  Dispense: 10 tablet; Refill: 0 - Brain natriuretic peptide  2. Localized edema Much improved, will check kidney function due to chronic kidney disease. - furosemide (LASIX) 20 MG tablet; Take 1 tablet (20 mg total) by mouth daily.  Dispense: 10 tablet; Refill: 0   I have reviewed the patient's medical history (PMH, PSH, Social History, Family History, Medications, and allergies) , and have been updated if relevant. I spent 20 minutes reviewing chart and  face to face time with patient.     Meds ordered this encounter  Medications  . furosemide (LASIX) 20 MG tablet    Sig: Take 1 tablet (20 mg total) by mouth daily.    Dispense:  10 tablet    Refill:  0    Order Specific Question:   Supervising  Provider    Answer:   Elsie Stain [[id number]]    Follow-up: Return if symptoms worsen or fail to improve.    Loraine Grip Mayers, PA-C

## 2021-03-01 NOTE — Progress Notes (Signed)
Patient presents for follow up from 1 week visit. Patient has not eaten today and patient has taken medication today. Patient denies pain at this time.

## 2021-03-01 NOTE — Patient Instructions (Signed)
Continue using the Lasix on a daily basis, we will call you with your lab results.  Make sure that you keep your follow-up appointment with cardiology.  I encourage you to check your blood pressure at home, keep a written log and have available for all office visits  Kennieth Rad, PA-C Physician Assistant [place] [email or web address]    Heart Failure, Diagnosis  Heart failure is a condition in which the heart has trouble pumping blood. This may mean that the heart cannot pump enough blood out to the body or that the heart does not fill up with enough blood. For some people with heart failure, fluid may back up into the lungs. There may also be swelling (edema) in the lower legs. Heart failure is usually a long-term (chronic) condition. It is important for you to take good care of yourself and follow the treatment plan from your health care provider. What are the causes? This condition may be caused by:  High blood pressure (hypertension). Hypertension causes the heart muscle to work harder than normal.  Coronary artery disease, or CAD. CAD is the buildup of cholesterol and fat (plaque) in the arteries of the heart.  Heart attack, also called myocardial infarction. This injures the heart muscle, making it hard for the heart to pump blood.  Abnormal heart valves. The valves do not open and close properly, forcing the heart to pump harder to keep the blood flowing.  Heart muscle disease, inflammation, or infection (cardiomyopathy or myocarditis). This is damage to the heart muscle. It can increase the risk of heart failure.  Lung disease. The heart works harder when the lungs are not healthy. What increases the risk? The risk of heart failure increases as a person ages. This condition is also more likely to develop in people who:  Are obese.  Are female.  Use tobacco or nicotine products.  Abuse alcohol or drugs.  Have  taken medicines that can damage the heart, such as chemotherapy drugs.  Have any of these conditions: ? Diabetes. ? Abnormal heart rhythms. ? Thyroid problems. ? Low blood counts (anemia). ? Chronic kidney disease.  Have a family history of heart failure. What are the signs or symptoms? Symptoms of this condition include:  Shortness of breath with activity, such as when climbing stairs.  A cough that does not go away.  Swelling of the feet, ankles, legs, or abdomen.  Losing or gaining weight for no reason.  Trouble breathing when lying flat.  Waking from sleep because of the need to sit up and get more air.  Rapid heartbeat.  Tiredness (fatigue) and loss of energy.  Feeling light-headed, dizzy, or close to fainting.  Nausea or loss of appetite.  Waking up more often during the night to urinate (nocturia).  Confusion. How is this diagnosed? This condition is diagnosed based on:  Your medical history, symptoms, and a physical exam.  Diagnostic tests, which may include: ? Echocardiogram. ? Electrocardiogram (ECG). ? Chest X-ray. ? Blood tests. ? Exercise stress test. ? Cardiac MRI. ? Cardiac catheterization and angiogram. ? Radionuclide scans. How is this treated? Treatment for this condition is aimed at managing the symptoms of heart failure. Medicines Treatment may include medicines that:  Help lower blood pressure by relaxing (dilating) the blood vessels. These medicines are called ACE inhibitors (angiotensin-converting enzyme), ARBs (angiotensin receptor blockers), or vasodilators.  Cause the kidneys to remove salt and water from the blood through urination (diuretics).  Improve heart muscle strength and prevent  the heart from beating too fast (beta blockers).  Increase the force of the heartbeat (digoxin).  Lower heart rates. Certain diabetes medicines (SGLT-2 inhibitors) may also be used in treatment. Healthy behavior changes Treatment may also  include making healthy lifestyle changes, such as:  Reaching and staying at a healthy weight.  Not using tobacco or nicotine products.  Eating heart-healthy foods.  Limiting or avoiding alcohol.  Stopping the use of illegal drugs.  Being physically active.  Participating in a cardiac rehabilitation program, which is a treatment program to improve your health and well-being through exercise training, education, and counseling. Other treatments Other treatments may include:  Procedures to open blocked arteries or repair damaged valves.  Placing a pacemaker to improve heart function (cardiac resynchronization therapy).  Placing a device to treat serious abnormal heart rhythms (implantable cardioverter defibrillator, or ICD).  Placing a device to improve the pumping ability of the heart (left ventricular assist device, or LVAD).  Receiving a healthy heart from a donor (heart transplant). This is done when other treatments have not helped. Follow these instructions at home:  Manage other health conditions as told by your health care provider. These may include hypertension, diabetes, thyroid disease, or abnormal heart rhythms.  Get ongoing education and support as needed. Learn as much as you can about heart failure.  Keep all follow-up visits. This is important. Summary  Heart failure is a condition in which the heart has trouble pumping blood.  This condition is commonly caused by high blood pressure and other diseases of the heart and lungs.  Symptoms of this condition include shortness of breath, tiredness (fatigue), nausea, and swelling of the feet, ankles, legs, or abdomen.  Treatments for this condition may include medicines, lifestyle changes, and surgery.  Manage other health conditions as told by your health care provider. This information is not intended to replace advice given to you by your health care provider. Make sure you discuss any questions you have with  your health care provider. Document Revised: 06/25/2020 Document Reviewed: 06/25/2020 Elsevier Patient Education  [place].

## 2021-03-02 ENCOUNTER — Telehealth: Payer: Self-pay | Admitting: *Deleted

## 2021-03-02 DIAGNOSIS — R7989 Other specified abnormal findings of blood chemistry: Secondary | ICD-10-CM | POA: Insufficient documentation

## 2021-03-02 LAB — BASIC METABOLIC PANEL
BUN/Creatinine Ratio: 15 (ref 12–28)
BUN: 32 mg/dL — ABNORMAL HIGH (ref 8–27)
CO2: 20 mmol/L (ref 20–29)
Calcium: 8.8 mg/dL (ref 8.7–10.3)
Chloride: 105 mmol/L (ref 96–106)
Creatinine, Ser: 2.15 mg/dL — ABNORMAL HIGH (ref 0.57–1.00)
Glucose: 165 mg/dL — ABNORMAL HIGH (ref 65–99)
Potassium: 4.3 mmol/L (ref 3.5–5.2)
Sodium: 141 mmol/L (ref 134–144)
eGFR: 26 mL/min/{1.73_m2} — ABNORMAL LOW (ref >59)

## 2021-03-02 LAB — BRAIN NATRIURETIC PEPTIDE: BNP: 238.8 pg/mL — ABNORMAL HIGH (ref 0.0–100.0)

## 2021-03-02 NOTE — Telephone Encounter (Signed)
-----   Message from Kennieth Rad, [place] sent at 03/02/2021 11:08 AM EDT ----- Let her know that her kidney function has worsened slightly, I encourage her to use the Lasix every other day until she sees cardiology for further review.

## 2021-03-02 NOTE — Telephone Encounter (Signed)
Patient verified DOB Patient is aware of kidney function worsening and needing to take lasix EOD until she sees cardiology for further review.

## 2021-03-07 ENCOUNTER — Other Ambulatory Visit: Payer: Self-pay

## 2021-03-07 ENCOUNTER — Other Ambulatory Visit: Payer: Self-pay | Admitting: Cardiovascular Disease

## 2021-03-07 ENCOUNTER — Encounter: Payer: Self-pay | Admitting: Cardiovascular Disease

## 2021-03-07 ENCOUNTER — Ambulatory Visit (INDEPENDENT_AMBULATORY_CARE_PROVIDER_SITE_OTHER): Payer: Self-pay | Admitting: Cardiovascular Disease

## 2021-03-07 VITALS — BP 134/90 | HR 81 | Ht 63.0 in | Wt 215.0 lb

## 2021-03-07 DIAGNOSIS — E782 Mixed hyperlipidemia: Secondary | ICD-10-CM

## 2021-03-07 DIAGNOSIS — I10 Essential (primary) hypertension: Secondary | ICD-10-CM

## 2021-03-07 DIAGNOSIS — R0600 Dyspnea, unspecified: Secondary | ICD-10-CM

## 2021-03-07 DIAGNOSIS — R0609 Other forms of dyspnea: Secondary | ICD-10-CM

## 2021-03-07 DIAGNOSIS — I6521 Occlusion and stenosis of right carotid artery: Secondary | ICD-10-CM

## 2021-03-07 MED ORDER — FUROSEMIDE 20 MG PO TABS: 20.0000 mg | ORAL_TABLET | Freq: Every day | ORAL | 3 refills | Status: DC

## 2021-03-07 MED FILL — FUROSEMIDE 20 MG TABS: 20 | 30 days supply | Qty: 30 | Fill #0

## 2021-03-07 NOTE — Progress Notes (Signed)
03/07/2021 Nancy [name]   26-Apr-1961  [id number]  Primary Physician Nancy Pier, MD Primary Cardiologist: Nancy Harp MD Nancy [name], [place]  HPI:  Nancy [name] is a 60 y.o. moderate to severely overweight married African-American female mother of 1 child with no grandchildren who does not work and was referred by her primary care provider, Nancy Meiers PA-C for evaluation of dyspnea on exertion and orthopnea.  She does have a brief history of tobacco abuse having begun smoking at age 4 and stopped 4 months ago.  She has treated hypertension and diabetes and untreated hyperlipidemia.  She does have a family history of heart disease with a mother that had myocardial infarction in brother who has had stents.  She apparently had asymptomatic strokes found on CT but has never been clinically diagnosed with 1.  She is never had a heart attack.  She denies chest pain but does admit to dyspnea on exertion has been getting progressively worse as well as bilateral lower extremity edema for which she was prescribed furosemide.  She also still describes symptoms compatible with orthopnea.  She had blood work performed by her PCP that showed elevated BNP in the 300 range.   Current Meds  Medication Sig  . acetaminophen (TYLENOL) 500 MG tablet Take 500 mg by mouth every 6 (six) hours as needed for moderate pain or headache.  Marland Kitchen amLODipine (NORVASC) 10 MG tablet Take 1 tablet (10 mg total) by mouth daily.  Marland Kitchen aspirin EC 81 MG tablet Take 1 tablet (81 mg total) by mouth daily.  . Blood Glucose Monitoring Suppl (ONETOUCH VERIO) w/Device KIT Check blood sugar three times daily. E11.40  . Blood Glucose Monitoring Suppl (TRUE METRIX METER) w/Device KIT Check blood sugar three times daily. E11.40  . DULoxetine (CYMBALTA) 20 MG capsule Take 1 capsule (20 mg total) by mouth daily.  Marland Kitchen glucose blood test strip Use as instructedCheck blood sugar three times daily. E11.40  . insulin  glargine (LANTUS) 100 UNIT/ML Solostar Pen Inject 58 Units into the skin at bedtime. (Patient taking differently: Inject 55 Units into the skin at bedtime.)  . insulin lispro (HUMALOG KWIKPEN) 100 UNIT/ML KwikPen Inject 4 units before breakfast and dinner.  . metoprolol tartrate (LOPRESSOR) 25 MG tablet Take 1 tablet (25 mg total) by mouth 2 (two) times daily.  . mirtazapine (REMERON) 15 MG tablet Take 1 tablet (15 mg total) by mouth at bedtime.  . risperiDONE (RISPERDAL) 3 MG tablet Take 1 tablet (3 mg total) by mouth at bedtime.  . [DISCONTINUED] furosemide (LASIX) 20 MG tablet Take 20 mg by mouth every other day.     Allergies  Allergen Reactions  . Elemental Sulfur Hives  . Hydralazine Hcl     Hair loss  . Hydrocodone Other (See Comments)    Upset stomach, itching  . Metformin And Related Nausea And Vomiting and Other (See Comments)    Stomach pains   . Plaquenil [Hydroxychloroquine Sulfate] Hives    Social History   Socioeconomic History  . Marital status: Legally Separated    Spouse name: Nancy [name]  . Number of children: 1  . Years of education: some colle  . Highest education level: Not on file  Occupational History  . Occupation: Employed FT as Landscape architect    Comment: Nancy [name] , NA  Tobacco Use  . Smoking status: Former Smoker    Packs/day: 0.25    Years: 0.50    Pack years: 0.12  Types: Cigarettes  . Smokeless tobacco: Never Used  . Tobacco comment: quit yrs ago  Vaping Use  . Vaping Use: Never used  Substance and Sexual Activity  . Alcohol use: No    Comment: occasion   . Drug use: No  . Sexual activity: Yes    Birth control/protection: None  Other Topics Concern  . 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  Cardiovascular: negative for chest pain, dyspnea on exertion, edema, orthopnea, palpitations, paroxysmal nocturnal dyspnea or shortness of breath Dermatological: negative for rash Respiratory: negative for cough or wheezing Urologic: negative for hematuria Abdominal: negative for nausea, vomiting, diarrhea, bright red blood per rectum, melena, or hematemesis Neurologic: negative for visual changes, syncope, or dizziness All other systems reviewed and are otherwise negative except as noted above.    Blood pressure 134/90, pulse 81, height $RemoveBe'5\' 3"'uGceXZYlk$  (1.6 m), weight 215 lb (97.5 kg).  General appearance: alert and no distress Neck: no adenopathy, no JVD, supple, symmetrical, trachea midline, thyroid not enlarged, symmetric, no tenderness/mass/nodules and Right carotid bruit Lungs: clear to auscultation bilaterally Heart: regular rate and rhythm, S1, S2 normal, no murmur, click, rub or gallop Extremities: 2-3+ pitting edema bilaterally Pulses: 2+ and symmetric Skin: Skin color, texture, turgor normal. No rashes or lesions Neurologic: Alert and oriented X 3, normal strength and tone. Normal symmetric reflexes. Normal coordination and gait  EKG sinus rhythm at 81 with left axis deviation and nonspecific ST and T wave changes.  I personally reviewed this EKG.  ASSESSMENT AND PLAN:   Hyperlipidemia History of hyperlipidemia not on statin therapy with lipid profile performed 02/29/2020 revealing total cholesterol 149, LDL of 81 and HDL 49.  Hypertension History of essential hypertension blood pressure measured today 134/90.  She is on amlodipine.  Stenosis of right carotid artery History of carotid artery disease with Doppler study performed 11/17/2019 revealing occluded right internal  carotid artery and moderate left ICA stenosis.  We will repeat carotid Dopplers.  Dyspnea on exertion Nancy [name] was referred to me by Nancy Meiers PA-C for elevated BNP and symptoms of congestive heart failure.  She does have a history of hypertension, hyperlipidemia and diabetes.  She has a family history of heart disease.  She is never had a heart attack but has had asymptomatic strokes in the past with known carotid artery disease.  Of last several months she is noted lower extremity edema and orthopnea.  She was begun on low-dose Lasix every day which was decreased to every other day because of progressive renal insufficiency.  She has limited her salt intake.  Recent lab work did show elevated BNP of 321 on March 10 which decreased to 338 on March 16.  I am going get a 2D echo to further evaluate.  In addition, I am going to go back up on her furosemide every day, get a basic metabolic panel today and again in 7 to 10 days.      Nancy Harp MD FACP,FACC,FAHA,  FSCAI 03/07/2021 2:24 PM

## 2021-03-07 NOTE — Assessment & Plan Note (Signed)
History of hyperlipidemia not on statin therapy with lipid profile performed 02/29/2020 revealing total cholesterol 149, LDL of 81 and HDL 49.

## 2021-03-07 NOTE — Assessment & Plan Note (Signed)
History of essential hypertension blood pressure measured today 134/90.  She is on amlodipine.

## 2021-03-07 NOTE — Patient Instructions (Signed)
Medication Instructions:   -Start taking furosemide (lasix) 20mg  daily.  *If you need a refill on your cardiac medications before your next appointment, please call your pharmacy*   Lab Work: Your physician recommends that you have labs drawn today: BMET  Your physician recommends that you return for lab work in: 2 weeks fasting lipid/liver profile and BMET   If you have labs (blood work) drawn today and your tests are completely normal, you will receive your results only by: Marland Kitchen MyChart Message (if you have MyChart) OR . A paper copy in the mail If you have any lab test that is abnormal or we need to change your treatment, we will call you to review the results.   Testing/Procedures: Your physician has requested that you have an echocardiogram. Echocardiography is a painless test that uses sound waves to create images of your heart. It provides your doctor with information about the size and shape of your heart and how well your heart's chambers and valves are working. This procedure takes approximately one hour. There are no restrictions for this procedure. This procedure is done at [address] physician has requested that you have a carotid duplex. This test is an ultrasound of the carotid arteries in your neck. It looks at blood flow through these arteries that supply the brain with blood. Allow one hour for this exam. There are no restrictions or special instructions. This procedure is done at [place]. 2nd Floor.   Follow-Up: At [facility], you and your health needs are our priority.  As part of our continuing mission to provide you with exceptional heart care, we have created designated Provider Care Teams.  These Care Teams include your primary Cardiologist (physician) and Advanced Practice Providers (APPs -  Physician Assistants and Nurse Practitioners) who all work together to provide you with the care you need, when you need it.  We recommend  signing up for the patient portal called "MyChart".  Sign up information is provided on this After Visit Summary.  MyChart is used to connect with patients for Virtual Visits (Telemedicine).  Patients are able to view lab/test results, encounter notes, upcoming appointments, etc.  Non-urgent messages can be sent to your provider as well.   To learn more about what you can do with MyChart, go to [email or web address].    Your next appointment:   6 week(s)  The format for your next appointment:   In Person  Provider:   Quay Burow, MD

## 2021-03-07 NOTE — Assessment & Plan Note (Signed)
History of carotid artery disease with Doppler study performed 11/17/2019 revealing occluded right internal carotid artery and moderate left ICA stenosis.  We will repeat carotid Dopplers.

## 2021-03-07 NOTE — Assessment & Plan Note (Signed)
Ms. Clendenin was referred to me by [facility] PA-C for elevated BNP and symptoms of congestive heart failure.  She does have a history of hypertension, hyperlipidemia and diabetes.  She has a family history of heart disease.  She is never had a heart attack but has had asymptomatic strokes in the past with known carotid artery disease.  Of last several months she is noted lower extremity edema and orthopnea.  She was begun on low-dose Lasix every day which was decreased to every other day because of progressive renal insufficiency.  She has limited her salt intake.  Recent lab work did show elevated BNP of 321 on March 10 which decreased to 338 on March 16.  I am going get a 2D echo to further evaluate.  In addition, I am going to go back up on her furosemide every day, get a basic metabolic panel today and again in 7 to 10 days.

## 2021-03-08 LAB — BASIC METABOLIC PANEL
BUN/Creatinine Ratio: 13 (ref 12–28)
BUN: 23 mg/dL (ref 8–27)
CO2: 22 mmol/L (ref 20–29)
Calcium: 9.2 mg/dL (ref 8.7–10.3)
Chloride: 106 mmol/L (ref 96–106)
Creatinine, Ser: 1.76 mg/dL — ABNORMAL HIGH (ref 0.57–1.00)
Glucose: 146 mg/dL — ABNORMAL HIGH (ref 65–99)
Potassium: 5.2 mmol/L (ref 3.5–5.2)
Sodium: 142 mmol/L (ref 134–144)
eGFR: 33 mL/min/{1.73_m2} — ABNORMAL LOW (ref >59)

## 2021-03-09 ENCOUNTER — Other Ambulatory Visit: Payer: Self-pay

## 2021-03-09 DIAGNOSIS — I10 Essential (primary) hypertension: Secondary | ICD-10-CM

## 2021-03-10 ENCOUNTER — Other Ambulatory Visit: Payer: Self-pay

## 2021-03-10 ENCOUNTER — Ambulatory Visit (HOSPITAL_COMMUNITY)
Admission: RE | Admit: 2021-03-10 | Discharge: 2021-03-10 | Disposition: A | Discharge status: Home / Self Care | Payer: Self-pay | Source: Ambulatory Visit | Attending: Cardiology | Admitting: Cardiology

## 2021-03-10 DIAGNOSIS — I6521 Occlusion and stenosis of right carotid artery: Secondary | ICD-10-CM | POA: Insufficient documentation

## 2021-03-24 ENCOUNTER — Other Ambulatory Visit: Payer: Self-pay

## 2021-03-29 ENCOUNTER — Ambulatory Visit (HOSPITAL_COMMUNITY): Payer: No Payment, Other | Admitting: Licensed Clinical Social Worker

## 2021-04-05 ENCOUNTER — Other Ambulatory Visit: Payer: Self-pay

## 2021-04-05 ENCOUNTER — Ambulatory Visit (HOSPITAL_COMMUNITY): Payer: Medicaid Other | Attending: Cardiovascular Disease

## 2021-04-05 DIAGNOSIS — R0600 Dyspnea, unspecified: Secondary | ICD-10-CM

## 2021-04-05 DIAGNOSIS — R0609 Other forms of dyspnea: Secondary | ICD-10-CM

## 2021-04-05 LAB — ECHOCARDIOGRAM COMPLETE
Area-P 1/2: 5.31 cm2
MV VTI: 2 cm2
P 1/2 time: 56 msec
S' Lateral: 3.2 cm

## 2021-04-17 ENCOUNTER — Ambulatory Visit (HOSPITAL_COMMUNITY): Payer: Self-pay | Admitting: Licensed Clinical Social Worker

## 2021-04-18 ENCOUNTER — Other Ambulatory Visit: Payer: Self-pay

## 2021-04-18 ENCOUNTER — Ambulatory Visit (INDEPENDENT_AMBULATORY_CARE_PROVIDER_SITE_OTHER): Payer: Self-pay | Admitting: Cardiovascular Disease

## 2021-04-18 ENCOUNTER — Encounter: Payer: Self-pay | Admitting: Cardiovascular Disease

## 2021-04-18 DIAGNOSIS — I6521 Occlusion and stenosis of right carotid artery: Secondary | ICD-10-CM

## 2021-04-18 DIAGNOSIS — E782 Mixed hyperlipidemia: Secondary | ICD-10-CM

## 2021-04-18 DIAGNOSIS — R0600 Dyspnea, unspecified: Secondary | ICD-10-CM

## 2021-04-18 DIAGNOSIS — R0609 Other forms of dyspnea: Secondary | ICD-10-CM

## 2021-04-18 DIAGNOSIS — I10 Essential (primary) hypertension: Secondary | ICD-10-CM

## 2021-04-18 MED ORDER — METOPROLOL TARTRATE 50 MG PO TABS
50.0000 mg | ORAL_TABLET | Freq: Two times a day (BID) | ORAL | 3 refills | Status: DC
  Filled 2021-04-18: qty 60, 30d supply, fill #0
  Filled 2021-06-05: qty 60, 30d supply, fill #1

## 2021-04-18 MED ORDER — FUROSEMIDE 20 MG PO TABS
40.0000 mg | ORAL_TABLET | Freq: Every day | ORAL | 3 refills | Status: DC
  Filled 2021-04-18: qty 60, 30d supply, fill #0

## 2021-04-18 MED ORDER — AMLODIPINE BESYLATE 2.5 MG PO TABS
2.5000 mg | ORAL_TABLET | Freq: Every day | ORAL | 3 refills | Status: DC
  Filled 2021-04-18: qty 30, 30d supply, fill #0

## 2021-04-18 NOTE — Assessment & Plan Note (Signed)
History of carotid artery disease by duplex ultrasound recently checked 03/10/2021 revealing occluded right internal carotid artery and moderate left ICA stenosis.  This will be repeated on annual basis.

## 2021-04-18 NOTE — Addendum Note (Signed)
Addended by: Rexanne Mano B on: 04/18/2021 01:46 PM   Modules accepted: Orders

## 2021-04-18 NOTE — Progress Notes (Signed)
Paroxetine is what the Paxil gave her 1 dose     04/18/2021 LIBI CORSO   10-29-1961  [id number]  Primary Physician Nancy Pier, MD Primary Cardiologist: Nancy Harp MD Nancy [name], [place]  HPI:  Nancy [name] is a 60 y.o.  moderate to severely overweight married African-American female mother of 1 child with no grandchildren who does not work and was referred by her primary care provider, Nancy Meiers PA-C for evaluation of dyspnea on exertion and orthopnea.  She is accompanied by [name] today.  I last saw her in the office 03/07/2021. She does have a brief history of tobacco abuse having begun smoking at age 69 and stopped 4 months ago.  She has treated hypertension and diabetes and untreated hyperlipidemia.  She does have a family history of heart disease with a mother that had myocardial infarction in brother who has had stents.  She apparently had asymptomatic strokes found on CT but has never been clinically diagnosed with 1.  She is never had a heart attack.  She denies chest pain but does admit to dyspnea on exertion has been getting progressively worse as well as bilateral lower extremity edema for which she was prescribed furosemide.  She also still describes symptoms compatible with orthopnea.  She had blood work performed by her PCP that showed elevated BNP in the 300 range.   I did get a 2D echocardiogram on her 04/05/2021 that showed normal LV systolic function, grade 2 diastolic dysfunction and no valvular abnormalities.  I did increase her furosemide to 20 mg a day which did not substantively change her renal function.  Last creatinine was 1.76.  Carotid Dopplers performed 03/10/2021 showed an occluded right internal carotid artery with moderate left ICA stenosis.  Current Meds  Medication Sig  . acetaminophen (TYLENOL) 500 MG tablet Take 500 mg by mouth every 6 (six) hours as needed for moderate pain or headache.  Marland Kitchen aspirin EC 81 MG tablet Take  1 tablet (81 mg total) by mouth daily.  . Blood Glucose Monitoring Suppl (ONETOUCH VERIO) w/Device KIT Check blood sugar three times daily. E11.40  . Blood Glucose Monitoring Suppl (TRUE METRIX METER) w/Device KIT Check blood sugar three times daily. E11.40  . DULoxetine (CYMBALTA) 20 MG capsule TAKE 1 CAPSULE (20 MG TOTAL) BY MOUTH DAILY.  . furosemide (LASIX) 20 MG tablet TAKE 1 TABLET (20 MG TOTAL) BY MOUTH DAILY.  Marland Kitchen glucose blood test strip Use as instructedCheck blood sugar three times daily. E11.40  . insulin glargine (LANTUS) 100 UNIT/ML Solostar Pen Inject 58 Units into the skin at bedtime. (Patient taking differently: Inject 55 Units into the skin at bedtime.)  . insulin lispro (HUMALOG KWIKPEN) 100 UNIT/ML KwikPen Inject 4 units before breakfast and dinner.  . metoprolol tartrate (LOPRESSOR) 25 MG tablet Take 1 tablet (25 mg total) by mouth 2 (two) times daily.  . mirtazapine (REMERON) 15 MG tablet TAKE 1 TABLET (15 MG TOTAL) BY MOUTH AT BEDTIME.  Marland Kitchen risperiDONE (RISPERDAL) 3 MG tablet TAKE 1 TABLET (3 MG TOTAL) BY MOUTH AT BEDTIME.  . [DISCONTINUED] amLODipine (NORVASC) 10 MG tablet Take 1 tablet (10 mg total) by mouth daily.     Allergies  Allergen Reactions  . Elemental Sulfur Hives  . Hydralazine Hcl     Hair loss  . Hydrocodone Other (See Comments)    Upset stomach, itching  . Metformin And Related Nausea And Vomiting and Other (See Comments)    Stomach pains   .  Plaquenil [Hydroxychloroquine Sulfate] Hives    Social History   Socioeconomic History  . Marital status: Legally Separated    Spouse name: [name]  . Number of children: 1  . Years of education: some colle  . Highest education level: Not on file  Occupational History  . Occupation: Employed FT as Landscape architect    Comment: [employer] , NA  Tobacco Use  . Smoking status: Former Smoker    Packs/day: 0.25    Years: 0.50    Pack years: 0.12    Types: Cigarettes  . Smokeless tobacco: Never Used  . Tobacco  comment: quit yrs ago  Vaping Use  . Vaping Use: Never used  Substance and Sexual Activity  . Alcohol use: No    Comment: occasion   . Drug use: No  . Sexual activity: Yes    Birth control/protection: None  Other Topics Concern  . 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  Cardiovascular: negative for chest pain, dyspnea on exertion, edema, orthopnea, palpitations, paroxysmal nocturnal dyspnea or shortness of breath Dermatological: negative for rash Respiratory: negative for cough or wheezing Urologic: negative for hematuria Abdominal: negative for nausea, vomiting, diarrhea, bright red blood per rectum, melena, or hematemesis Neurologic: negative for visual changes, syncope, or dizziness All other systems reviewed and are otherwise negative except as noted above.    Blood pressure (!) 142/86, pulse 90, height 5' 3" (1.6 m), weight 222 lb 12.8 oz (101.1 kg), SpO2 97 %.  General appearance: alert and no distress Neck: no adenopathy, no JVD, supple, symmetrical, trachea midline, thyroid not enlarged, symmetric, no tenderness/mass/nodules and Soft right carotid bruit Lungs: clear to auscultation bilaterally Heart: regular rate and rhythm, S1, S2 normal, no murmur, click, rub or gallop Extremities: 2-3+ pitting edema bilaterally Pulses: 2+ and symmetric Skin: Skin color, texture, turgor normal. No rashes or lesions Neurologic: Alert and oriented X 3, normal strength and tone. Normal symmetric reflexes. Normal coordination and  gait  EKG not performed today  ASSESSMENT AND PLAN:   Hyperlipidemia History of hyperlipidemia not on statin therapy with lipid profile performed 02/29/2020 revealing a total cholesterol of 149, LDL of 81 and HDL 49.  Hypertension History of essential hypertension with blood pressure measured today at 142/86.  She is on high-dose amlodipine in addition to metoprolol.  Her serum creatinine is in the high 1 range.  I am going to decrease her amlodipine from 10 mg to 2.5 mg since this may be contributory to her edema, increase her beta-blocker and her diuretic.  Stenosis of right carotid artery History of carotid artery disease by duplex ultrasound recently checked 03/10/2021 revealing occluded right internal carotid artery and moderate left ICA stenosis.  This will be repeated on annual basis.  Dyspnea on exertion History of dyspnea on exertion orthopnea with only a 2 to 3-year history of tobacco abuse having stopped 6 months ago.  I did get a 2D echo on her 04/05/2021 revealing normal LV systolic function with grade 2  diastolic dysfunction and no valvular abnormalities.  She is on 20 mg of Lasix and still has 2-3+ pitting edema.  Her serum creatinine most recently checked was 1.76.  I am going to increase her furosemide from 20 to 40 mg a day and we will recheck a basic metabolic panel in 2 weeks.  I am also going to decrease her amlodipine from 10 mg to 2.5 mg a day since this may be contributing to her edema.  Its not clear to me what is causing her significant dyspnea on exertion and orthopnea at this time.  I am going to get a Lexiscan Myoview to further evaluate.      Nancy Harp MD FACP,FACC,FAHA, [facility] 04/18/2021 11:39 AM

## 2021-04-18 NOTE — Addendum Note (Signed)
Addended by: [name] on: 04/18/2021 02:45 PM   Modules accepted: Orders

## 2021-04-18 NOTE — Assessment & Plan Note (Signed)
History of dyspnea on exertion orthopnea with only a 2 to 3-year history of tobacco abuse having stopped 6 months ago.  I did get a 2D echo on her 04/05/2021 revealing normal LV systolic function with grade 2 diastolic dysfunction and no valvular abnormalities.  She is on 20 mg of Lasix and still has 2-3+ pitting edema.  Her serum creatinine most recently checked was 1.76.  I am going to increase her furosemide from 20 to 40 mg a day and we will recheck a basic metabolic panel in 2 weeks.  I am also going to decrease her amlodipine from 10 mg to 2.5 mg a day since this may be contributing to her edema.  Its not clear to me what is causing her significant dyspnea on exertion and orthopnea at this time.  I am going to get a Lexiscan Myoview to further evaluate.

## 2021-04-18 NOTE — Assessment & Plan Note (Signed)
History of essential hypertension with blood pressure measured today at 142/86.  She is on high-dose amlodipine in addition to metoprolol.  Her serum creatinine is in the high 1 range.  I am going to decrease her amlodipine from 10 mg to 2.5 mg since this may be contributory to her edema, increase her beta-blocker and her diuretic.

## 2021-04-18 NOTE — Assessment & Plan Note (Signed)
History of hyperlipidemia not on statin therapy with lipid profile performed 02/29/2020 revealing a total cholesterol of 149, LDL of 81 and HDL 49.

## 2021-04-18 NOTE — Patient Instructions (Signed)
Medication Instructions:  INCREASE: METOPROLOL TARTRATE TO 50mg  (1) TABLET TWICE DAILY  INCREASE LASIX (FUROSEMIDE) TO 40mg  (2 TABLETS) DAILY  DECREASE AMLODIPINE TO 2.5mg  (1 TABLET) DAILY *If you need a refill on your cardiac medications before your next appointment, please call your pharmacy*  Lab Work: BMET- PLEASE RETURN IN 2 WEEKS FOR BLOOD WORK If you have labs (blood work) drawn today and your tests are completely normal, you will receive your results only by: Marland Kitchen MyChart Message (if you have MyChart) OR . A paper copy in the mail If you have any lab test that is abnormal or we need to change your treatment, we will call you to review the results.  Testing/Procedures: Your physician has requested that you have a lexiscan myoview. For further information please visit [email or web address]. Please follow instruction sheet, as given.    The test will take approximately 3 to 4 hours to complete; you may bring reading material.  If someone comes with you to your appointment, they will need to remain in the main lobby due to limited space in the testing area.    How to prepare for your Myocardial Perfusion Test:  Do not eat or drink 3 hours prior to your test, except you may have water.  Do not consume products containing caffeine (regular or decaffeinated) 12 hours prior to your test. (ex: coffee, chocolate, sodas, tea).  Do wear comfortable clothes (no dresses or overalls) and walking shoes, tennis shoes preferred (No heels or open toe shoes are allowed).  Do NOT wear cologne, perfume, aftershave, or lotions (deodorant is allowed).  If you use an inhaler, use it the AM of your test and bring it with you.   If you use a nebulizer, use it the AM of your test.   If these instructions are not followed, your test will have to be rescheduled.  Follow-Up: At [facility], you and your health needs are our priority.  As part of our continuing mission to provide you with exceptional heart  care, we have created designated Provider Care Teams.  These Care Teams include your primary Cardiologist (physician) and Advanced Practice Providers (APPs -  Physician Assistants and Nurse Practitioners) who all work together to provide you with the care you need, when you need it.  Your next appointment:   4 week(s)  The format for your next appointment:   In Person  Provider:   You will see one of the following Advanced Practice Providers on your designated Care Team:    Sande Rives, PA-C  Coletta Memos, FNP  Then, Dr. Gwenlyn Found will plan to see you again in 3 month(s).

## 2021-04-19 ENCOUNTER — Other Ambulatory Visit: Payer: Self-pay

## 2021-04-20 ENCOUNTER — Telehealth (HOSPITAL_COMMUNITY): Payer: Self-pay | Admitting: *Deleted

## 2021-04-20 NOTE — Telephone Encounter (Signed)
Close encounter 

## 2021-04-21 ENCOUNTER — Other Ambulatory Visit: Payer: Self-pay

## 2021-04-21 ENCOUNTER — Ambulatory Visit (INDEPENDENT_AMBULATORY_CARE_PROVIDER_SITE_OTHER): Payer: Self-pay | Admitting: Cardiovascular Disease

## 2021-04-21 ENCOUNTER — Ambulatory Visit (HOSPITAL_COMMUNITY)
Admission: RE | Admit: 2021-04-21 | Discharge: 2021-04-21 | Disposition: A | Discharge status: Home / Self Care | Payer: Medicaid Other | Source: Ambulatory Visit | Attending: Cardiology | Admitting: Cardiology

## 2021-04-21 ENCOUNTER — Encounter: Payer: Self-pay | Admitting: Cardiovascular Disease

## 2021-04-21 VITALS — BP 168/86 | HR 90 | Ht 63.0 in | Wt 223.6 lb

## 2021-04-21 DIAGNOSIS — E782 Mixed hyperlipidemia: Secondary | ICD-10-CM

## 2021-04-21 DIAGNOSIS — I10 Essential (primary) hypertension: Secondary | ICD-10-CM

## 2021-04-21 DIAGNOSIS — R0600 Dyspnea, unspecified: Secondary | ICD-10-CM | POA: Diagnosis present

## 2021-04-21 DIAGNOSIS — R0609 Other forms of dyspnea: Secondary | ICD-10-CM

## 2021-04-21 LAB — MYOCARDIAL PERFUSION IMAGING
LV dias vol: 112 mL (ref 46–106)
LV sys vol: 60 mL
Peak HR: 94 {beats}/min
Rest HR: 86 {beats}/min
SDS: 5
SRS: 10
SSS: 15
TID: 1.09

## 2021-04-21 MED ORDER — TECHNETIUM TC 99M TETROFOSMIN IV KIT
9.8000 | PACK | Freq: Once | INTRAVENOUS | Status: AC | PRN
  Administered 2021-04-21: 9.8 via INTRAVENOUS
  Filled 2021-04-21: qty 10

## 2021-04-21 MED ORDER — TECHNETIUM TC 99M TETROFOSMIN IV KIT
28.7000 | PACK | Freq: Once | INTRAVENOUS | Status: AC | PRN
  Administered 2021-04-21: 28.7 via INTRAVENOUS
  Filled 2021-04-21: qty 29

## 2021-04-21 MED ORDER — REGADENOSON 0.4 MG/5ML IV SOLN
0.4000 mg | Freq: Once | INTRAVENOUS | Status: AC
  Administered 2021-04-21: 0.4 mg via INTRAVENOUS

## 2021-04-21 NOTE — Progress Notes (Signed)
Nancy [name] returns today for follow-up of her Myoview stress test performed today that showed anterior ischemia.  Given her right carotid occlusion and progressive dyspnea despite normal LV function on 2D echo (grade 2 diastolic dysfunction) I think we will proceed with outpatient diagnostic coronary angiography.  Her serum creatinine is 1.76 although given her dyspnea I do not think I am going to prehydrated her.  I have reviewed the risks, indications, and alternatives to cardiac catheterization, possible angioplasty, and stenting with the patient. Risks include but are not limited to bleeding, infection, vascular injury, stroke, myocardial infection, arrhythmia, kidney injury, radiation-related injury in the case of prolonged fluoroscopy use, emergency cardiac surgery, and death. The patient understands the risks of serious complication is 1-2 in 8421 with diagnostic cardiac cath and 1-2% or less with angioplasty/stenting.   [name], M.D., [place], [facility], [place]ver[place] [address] [place], [place]  [address]  [phone] 04/21/2021 4:38 PM

## 2021-04-21 NOTE — Patient Instructions (Addendum)
    [facility] [place] [address] Dept: [phone] Loc: [phone]  ASHLAND OSMER  04/21/2021  You are scheduled for a Cardiac Catheterization on Thursday, May 12 with Dr. Quay Burow.  1. Please arrive at the [facility] (Main Entrance A) at [facility]: [address] [place], [place] [address] at 5:30 AM (This time is two hours before your procedure to ensure your preparation). Free valet parking service is available.   Special note: Every effort is made to have your procedure done on time. Please understand that emergencies sometimes delay scheduled procedures.  2. Diet: Do not eat solid foods after midnight.  The patient may have clear liquids until 5am upon the day of the procedure.  3. Labs: You will need to have blood drawn today.  4. Medication instructions in preparation for your procedure:  Hold furosemide (lasix) day of your procedure.  Take only 22 units of insulin the night before your procedure. Do not take any insulin on the day of the procedure.   On the morning of your procedure, take your Aspirin and any morning medicines NOT listed above.  You may use sips of water.  5. Plan for one night stay--bring personal belongings. 6. Bring a current list of your medications and current insurance cards. 7. You MUST have a responsible person to drive you home. 8. Someone MUST be with you the first 24 hours after you arrive home or your discharge will be delayed. 9. Please wear clothes that are easy to get on and off and wear slip-on shoes.  Thank you for allowing [place] to care for you!   -- [facility]  You will need a COVID-19  test prior to your procedure. You are scheduled for Monday, May 9th at 11:35 AM. This is a Drive Up Visit at [address] [place], [place] [address]. Someone will direct you to the  appropriate testing line. Stay in your car and someone will be with you shortly.  We will schedule you a 2 week follow up with Dr. Gwenlyn Found.

## 2021-04-21 NOTE — H&P (View-Only) (Signed)
Ms. Gilchrest returns today for follow-up of her Myoview stress test performed today that showed anterior ischemia.  Given her right carotid occlusion and progressive dyspnea despite normal LV function on 2D echo (grade 2 diastolic dysfunction) I think we will proceed with outpatient diagnostic coronary angiography.  Her serum creatinine is 1.76 although given her dyspnea I do not think I am going to prehydrated her.  I have reviewed the risks, indications, and alternatives to cardiac catheterization, possible angioplasty, and stenting with the patient. Risks include but are not limited to bleeding, infection, vascular injury, stroke, myocardial infection, arrhythmia, kidney injury, radiation-related injury in the case of prolonged fluoroscopy use, emergency cardiac surgery, and death. The patient understands the risks of serious complication is 1-2 in 3112 with diagnostic cardiac cath and 1-2% or less with angioplasty/stenting.   [name], M.D., [place], [facility], [place]ver[place] [address]. [place], [place]  [address]  [phone] 04/21/2021 4:38 PM

## 2021-04-22 LAB — CBC
Hematocrit: 29.6 % — ABNORMAL LOW (ref 34.0–46.6)
Hemoglobin: 9.4 g/dL — ABNORMAL LOW (ref 11.1–15.9)
MCH: 27.9 pg (ref 26.6–33.0)
MCHC: 31.8 g/dL (ref 31.5–35.7)
MCV: 88 fL (ref 79–97)
Platelets: 279 10*3/uL (ref 150–450)
RBC: 3.37 x10E6/uL — ABNORMAL LOW (ref 3.77–5.28)
RDW: 12.6 % (ref 11.7–15.4)
WBC: 9.7 10*3/uL (ref 3.4–10.8)

## 2021-04-22 LAB — BASIC METABOLIC PANEL
BUN/Creatinine Ratio: 11 — ABNORMAL LOW (ref 12–28)
BUN: 23 mg/dL (ref 8–27)
CO2: 21 mmol/L (ref 20–29)
Calcium: 9.3 mg/dL (ref 8.7–10.3)
Chloride: 101 mmol/L (ref 96–106)
Creatinine, Ser: 2.03 mg/dL — ABNORMAL HIGH (ref 0.57–1.00)
Glucose: 174 mg/dL — ABNORMAL HIGH (ref 65–99)
Potassium: 4.4 mmol/L (ref 3.5–5.2)
Sodium: 138 mmol/L (ref 134–144)
eGFR: 27 mL/min/{1.73_m2} — ABNORMAL LOW (ref >59)

## 2021-04-24 ENCOUNTER — Other Ambulatory Visit (HOSPITAL_COMMUNITY)
Admission: RE | Admit: 2021-04-24 | Discharge: 2021-04-24 | Disposition: A | Discharge status: Home / Self Care | Payer: Medicaid Other | Source: Ambulatory Visit | Attending: Cardiovascular Disease | Admitting: Cardiovascular Disease

## 2021-04-24 DIAGNOSIS — Z01812 Encounter for preprocedural laboratory examination: Secondary | ICD-10-CM | POA: Insufficient documentation

## 2021-04-24 DIAGNOSIS — Z20822 Contact with and (suspected) exposure to covid-19: Secondary | ICD-10-CM | POA: Insufficient documentation

## 2021-04-24 LAB — SARS CORONAVIRUS 2 (TAT 6-24 HRS): SARS Coronavirus 2: NEGATIVE

## 2021-04-25 ENCOUNTER — Telehealth: Payer: Self-pay | Admitting: *Deleted

## 2021-04-25 ENCOUNTER — Other Ambulatory Visit: Payer: Self-pay

## 2021-04-25 DIAGNOSIS — R9439 Abnormal result of other cardiovascular function study: Secondary | ICD-10-CM

## 2021-04-25 MED ORDER — SODIUM CHLORIDE 0.9% FLUSH: 3.0000 mL | Freq: Two times a day (BID) | INTRAVENOUS | Status: DC

## 2021-04-25 NOTE — Telephone Encounter (Addendum)
Pt contacted pre-catheterization scheduled at [facility] for: Thursday Apr 27, 2021 7:30 AM * Verified arrival time and place: [facility]) at: 5:30 AM   No solid food after midnight prior to cath, clear liquids until 5 AM day of procedure.  Hold: Lasix-day before and day of procedure-eGFR 27 Insulin-AM of procedure/ 1/2 usual Insulin HS prior to procedure  Except hold medications AM meds can be  taken pre-cath with sips of water including: ASA 81 mg   Confirmed patient has responsible adult to drive home post procedure and be with patient first 24 hours after arriving home: yes  You are allowed ONE visitor in the waiting room during the time you are at the hospital for your procedure. Both you and your visitor must wear a mask once you enter the hospital.     Reviewed procedure/mask/visitor instructions with patient.               Pt reports she does not tolerate shrimp, but does not recall past reaction to IV contrast.               * 04/21/21 eGFR 27-Dr Gwenlyn Found does not recommend pre-procedure hydration.

## 2021-04-27 ENCOUNTER — Ambulatory Visit (HOSPITAL_COMMUNITY)
Admission: RE | Disposition: A | Discharge status: Home / Self Care | Payer: Self-pay | Source: Home / Self Care | Attending: Cardiovascular Disease

## 2021-04-27 ENCOUNTER — Ambulatory Visit (HOSPITAL_COMMUNITY)
Admission: RE | Admit: 2021-04-27 | Discharge: 2021-04-27 | Disposition: A | Discharge status: Home / Self Care | Payer: Medicaid Other | Attending: Cardiovascular Disease | Admitting: Cardiovascular Disease

## 2021-04-27 DIAGNOSIS — R0609 Other forms of dyspnea: Secondary | ICD-10-CM | POA: Diagnosis present

## 2021-04-27 DIAGNOSIS — I272 Pulmonary hypertension, unspecified: Secondary | ICD-10-CM | POA: Insufficient documentation

## 2021-04-27 DIAGNOSIS — Z87891 Personal history of nicotine dependence: Secondary | ICD-10-CM | POA: Diagnosis not present

## 2021-04-27 DIAGNOSIS — E119 Type 2 diabetes mellitus without complications: Secondary | ICD-10-CM | POA: Insufficient documentation

## 2021-04-27 DIAGNOSIS — E785 Hyperlipidemia, unspecified: Secondary | ICD-10-CM | POA: Diagnosis not present

## 2021-04-27 DIAGNOSIS — R9439 Abnormal result of other cardiovascular function study: Secondary | ICD-10-CM | POA: Diagnosis present

## 2021-04-27 DIAGNOSIS — R0600 Dyspnea, unspecified: Secondary | ICD-10-CM

## 2021-04-27 DIAGNOSIS — I2510 Atherosclerotic heart disease of native coronary artery without angina pectoris: Secondary | ICD-10-CM | POA: Diagnosis not present

## 2021-04-27 DIAGNOSIS — Z8249 Family history of ischemic heart disease and other diseases of the circulatory system: Secondary | ICD-10-CM | POA: Diagnosis not present

## 2021-04-27 HISTORY — PX: RIGHT/LEFT HEART CATH AND CORONARY ANGIOGRAPHY: CATH118266

## 2021-04-27 LAB — POCT I-STAT EG7
Acid-Base Excess: 0 mmol/L (ref 0.0–2.0)
Acid-base deficit: 1 mmol/L (ref 0.0–2.0)
Bicarbonate: 27.3 mmol/L (ref 20.0–28.0)
Bicarbonate: 26.9 mmol/L (ref 20.0–28.0)
Calcium, Ion: 1.26 mmol/L (ref 1.15–1.40)
Calcium, Ion: 1.27 mmol/L (ref 1.15–1.40)
HCT: 26 % — ABNORMAL LOW (ref 36.0–46.0)
HCT: 26 % — ABNORMAL LOW (ref 36.0–46.0)
Hemoglobin: 8.8 g/dL — ABNORMAL LOW (ref 12.0–15.0)
Hemoglobin: 8.8 g/dL — ABNORMAL LOW (ref 12.0–15.0)
O2 Saturation: 57 %
O2 Saturation: 57 %
Potassium: 4.3 mmol/L (ref 3.5–5.1)
Potassium: 4.4 mmol/L (ref 3.5–5.1)
Sodium: 140 mmol/L (ref 135–145)
Sodium: 140 mmol/L (ref 135–145)
TCO2: 29 mmol/L (ref 22–32)
TCO2: 29 mmol/L (ref 22–32)
pCO2, Ven: 61 mmHg — ABNORMAL HIGH (ref 44.0–60.0)
pCO2, Ven: 60.5 mmHg — ABNORMAL HIGH (ref 44.0–60.0)
pH, Ven: 7.259 (ref 7.250–7.430)
pH, Ven: 7.257 (ref 7.250–7.430)
pO2, Ven: 35 mmHg (ref 32.0–45.0)
pO2, Ven: 35 mmHg (ref 32.0–45.0)

## 2021-04-27 LAB — POCT I-STAT 7, (LYTES, BLD GAS, ICA,H+H)
Acid-base deficit: 1 mmol/L (ref 0.0–2.0)
Bicarbonate: 25.8 mmol/L (ref 20.0–28.0)
Calcium, Ion: 1.24 mmol/L (ref 1.15–1.40)
HCT: 26 % — ABNORMAL LOW (ref 36.0–46.0)
Hemoglobin: 8.8 g/dL — ABNORMAL LOW (ref 12.0–15.0)
O2 Saturation: 99 %
Potassium: 4.3 mmol/L (ref 3.5–5.1)
Sodium: 140 mmol/L (ref 135–145)
TCO2: 27 mmol/L (ref 22–32)
pCO2 arterial: 52.3 mmHg — ABNORMAL HIGH (ref 32.0–48.0)
pH, Arterial: 7.302 — ABNORMAL LOW (ref 7.350–7.450)
pO2, Arterial: 135 mmHg — ABNORMAL HIGH (ref 83.0–108.0)

## 2021-04-27 LAB — BASIC METABOLIC PANEL
Anion gap: 6 (ref 5–15)
BUN: 23 mg/dL (ref 8–23)
CO2: 26 mmol/L (ref 22–32)
Calcium: 9 mg/dL (ref 8.9–10.3)
Chloride: 105 mmol/L (ref 98–111)
Creatinine, Ser: 2.02 mg/dL — ABNORMAL HIGH (ref 0.44–1.00)
GFR, Estimated: 28 mL/min — ABNORMAL LOW (ref >60)
Glucose, Bld: 145 mg/dL — ABNORMAL HIGH (ref 70–99)
Potassium: 4.8 mmol/L (ref 3.5–5.1)
Sodium: 137 mmol/L (ref 135–145)

## 2021-04-27 LAB — GLUCOSE, CAPILLARY
Glucose-Capillary: 141 mg/dL — ABNORMAL HIGH (ref 70–99)
Glucose-Capillary: 162 mg/dL — ABNORMAL HIGH (ref 70–99)

## 2021-04-27 SURGERY — RIGHT/LEFT HEART CATH AND CORONARY ANGIOGRAPHY
Anesthesia: LOCAL

## 2021-04-27 MED ORDER — HYDRALAZINE HCL 20 MG/ML IJ SOLN: 10.0000 mg | INTRAMUSCULAR | Status: DC | PRN

## 2021-04-27 MED ORDER — HEPARIN SODIUM (PORCINE) 1000 UNIT/ML IJ SOLN
INTRAMUSCULAR | Status: AC
  Filled 2021-04-27: qty 1

## 2021-04-27 MED ORDER — SODIUM CHLORIDE 0.9 % IV SOLN: 250.0000 mL | INTRAVENOUS | Status: DC | PRN

## 2021-04-27 MED ORDER — MIDAZOLAM HCL 2 MG/2ML IJ SOLN
INTRAMUSCULAR | Status: AC
  Filled 2021-04-27: qty 2

## 2021-04-27 MED ORDER — ONDANSETRON HCL 4 MG/2ML IJ SOLN: 4.0000 mg | Freq: Four times a day (QID) | INTRAMUSCULAR | Status: DC | PRN

## 2021-04-27 MED ORDER — ASPIRIN 81 MG PO CHEW
81.0000 mg | CHEWABLE_TABLET | ORAL | Status: DC
Start: 2021-04-28 — End: 2021-04-27

## 2021-04-27 MED ORDER — SODIUM CHLORIDE 0.9% FLUSH: 3.0000 mL | INTRAVENOUS | Status: DC | PRN

## 2021-04-27 MED ORDER — SODIUM CHLORIDE 0.9 % IV SOLN: INTRAVENOUS | Status: DC

## 2021-04-27 MED ORDER — MIDAZOLAM HCL 2 MG/2ML IJ SOLN
INTRAMUSCULAR | Status: DC | PRN
  Administered 2021-04-27: 1 mg via INTRAVENOUS

## 2021-04-27 MED ORDER — HEPARIN (PORCINE) IN NACL 1000-0.9 UT/500ML-% IV SOLN
INTRAVENOUS | Status: DC | PRN
  Administered 2021-04-27: 500 mL

## 2021-04-27 MED ORDER — SODIUM CHLORIDE 0.9% FLUSH: 3.0000 mL | Freq: Two times a day (BID) | INTRAVENOUS | Status: DC

## 2021-04-27 MED ORDER — ASPIRIN 81 MG PO CHEW
81.0000 mg | CHEWABLE_TABLET | Freq: Every day | ORAL | Status: DC
Start: 2021-04-27 — End: 2021-04-27

## 2021-04-27 MED ORDER — ACETAMINOPHEN 325 MG PO TABS: 650.0000 mg | ORAL_TABLET | ORAL | Status: DC | PRN

## 2021-04-27 MED ORDER — HEPARIN SODIUM (PORCINE) 1000 UNIT/ML IJ SOLN
INTRAMUSCULAR | Status: DC | PRN
  Administered 2021-04-27: 5000 [IU] via INTRAVENOUS

## 2021-04-27 MED ORDER — LIDOCAINE HCL (PF) 1 % IJ SOLN
INTRAMUSCULAR | Status: AC
  Filled 2021-04-27: qty 30

## 2021-04-27 MED ORDER — VERAPAMIL HCL 2.5 MG/ML IV SOLN
INTRA_ARTERIAL | Status: DC | PRN
  Administered 2021-04-27: 10 mL via INTRA_ARTERIAL

## 2021-04-27 MED ORDER — NITROGLYCERIN 1 MG/10 ML FOR IR/CATH LAB
INTRA_ARTERIAL | Status: AC
  Filled 2021-04-27: qty 10

## 2021-04-27 MED ORDER — FENTANYL CITRATE (PF) 100 MCG/2ML IJ SOLN
INTRAMUSCULAR | Status: DC | PRN
  Administered 2021-04-27: 25 ug via INTRAVENOUS

## 2021-04-27 MED ORDER — HEPARIN (PORCINE) IN NACL 1000-0.9 UT/500ML-% IV SOLN
INTRAVENOUS | Status: AC
  Filled 2021-04-27: qty 1500

## 2021-04-27 MED ORDER — SODIUM CHLORIDE 0.9% FLUSH
3.0000 mL | INTRAVENOUS | Status: DC | PRN
Start: 2021-04-27 — End: 2021-04-27

## 2021-04-27 MED ORDER — LABETALOL HCL 5 MG/ML IV SOLN
10.0000 mg | INTRAVENOUS | Status: DC | PRN
Start: 2021-04-27 — End: 2021-04-27
  Administered 2021-04-27: 10 mg via INTRAVENOUS
  Filled 2021-04-27: qty 4

## 2021-04-27 MED ORDER — FENTANYL CITRATE (PF) 100 MCG/2ML IJ SOLN
INTRAMUSCULAR | Status: AC
  Filled 2021-04-27: qty 2

## 2021-04-27 MED ORDER — IOHEXOL 350 MG/ML SOLN
INTRAVENOUS | Status: DC | PRN
  Administered 2021-04-27: 50 mL

## 2021-04-27 MED ORDER — LIDOCAINE HCL (PF) 1 % IJ SOLN
INTRAMUSCULAR | Status: DC | PRN
  Administered 2021-04-27: 3 mL
  Administered 2021-04-27: 4 mL

## 2021-04-27 MED ORDER — SODIUM CHLORIDE 0.9 % IV SOLN: INTRAVENOUS | Status: AC

## 2021-04-27 MED ORDER — VERAPAMIL HCL 2.5 MG/ML IV SOLN
INTRAVENOUS | Status: AC
  Filled 2021-04-27: qty 2

## 2021-04-27 SURGICAL SUPPLY — 13 items
CATH BALLN WEDGE 5F 110CM (CATHETERS) ×2 IMPLANT
CATH INFINITI 5FR ANG PIGTAIL (CATHETERS) ×2 IMPLANT
CATH OPTITORQUE TIG 4.0 5F (CATHETERS) ×2 IMPLANT
DEVICE RAD COMP TR BAND LRG (VASCULAR PRODUCTS) ×2 IMPLANT
GLIDESHEATH SLEND A-KIT 6F 22G (SHEATH) ×2 IMPLANT
GUIDEWIRE INQWIRE 1.5J.035X260 (WIRE) ×1 IMPLANT
INQWIRE 1.5J .035X260CM (WIRE) ×2
KIT HEART LEFT (KITS) ×2 IMPLANT
PACK CARDIAC CATHETERIZATION (CUSTOM PROCEDURE TRAY) ×2 IMPLANT
SHEATH GLIDE SLENDER 4/5FR (SHEATH) ×2 IMPLANT
TRANSDUCER W/STOPCOCK (MISCELLANEOUS) ×2 IMPLANT
TUBING CIL FLEX 10 FLL-RA (TUBING) ×2 IMPLANT
WIRE HI TORQ VERSACORE-J 145CM (WIRE) ×2 IMPLANT

## 2021-04-27 NOTE — Research (Signed)
IDENTIFY Informed Consent   Subject Name: Nancy [name]  Subject met inclusion and exclusion criteria.  The informed consent form, study requirements and expectations were reviewed with the subject and questions and concerns were addressed prior to the signing of the consent form.  The subject verbalized understanding of the trail requirements.  The subject agreed to participate in the IDENTIFY trial and signed the informed consent.  The informed consent was obtained prior to performance of any protocol-specific procedures for the subject.  A copy of the signed informed consent was given to the subject and a copy was placed in the subject's medical record.  Philemon Kingdom D 04/27/2021, [phone] am

## 2021-04-27 NOTE — Discharge Instructions (Signed)
Radial Site Care  This sheet gives you information about how to care for yourself after your procedure. Your health care provider may also give you more specific instructions. If you have problems or questions, contact your health care provider. What can I expect after the procedure? After the procedure, it is common to have:  Bruising and tenderness at the catheter insertion area. Follow these instructions at home: Medicines  Take over-the-counter and prescription medicines only as told by your health care provider. Insertion site care  Follow instructions from your health care provider about how to take care of your insertion site. Make sure you: ? Wash your hands with soap and water before you change your bandage (dressing). If soap and water are not available, use hand sanitizer. ? Change your dressing as told by your health care provider. ? Leave stitches (sutures), skin glue, or adhesive strips in place. These skin closures may need to stay in place for 2 weeks or longer. If adhesive strip edges start to loosen and curl up, you may trim the loose edges. Do not remove adhesive strips completely unless your health care provider tells you to do that.  Check your insertion site every day for signs of infection. Check for: ? Redness, swelling, or pain. ? Fluid or blood. ? Pus or a bad smell. ? Warmth.  Do not take baths, swim, or use a hot tub until your health care provider approves.  You may shower 24-48 hours after the procedure, or as directed by your health care provider. ? Remove the dressing and gently wash the site with plain soap and water. ? Pat the area dry with a clean towel. ? Do not rub the site. That could cause bleeding.  Do not apply powder or lotion to the site. Activity  For 24 hours after the procedure, or as directed by your health care provider: ? Do not flex or bend the affected arm. ? Do not push or pull heavy objects with the affected arm. ? Do not drive  yourself home from the hospital or clinic. You may drive 24 hours after the procedure unless your health care provider tells you not to. ? Do not operate machinery or power tools.  Do not lift anything that is heavier than 10 lb (4.5 kg), or the limit that you are told, until your health care provider says that it is safe.  Ask your health care provider when it is okay to: ? Return to work or school. ? Resume usual physical activities or sports. ? Resume sexual activity.   General instructions  If the catheter site starts to bleed, raise your arm and put firm pressure on the site. If the bleeding does not stop, get help right away. This is a medical emergency.  If you went home on the same day as your procedure, a responsible adult should be with you for the first 24 hours after you arrive home.  Keep all follow-up visits as told by your health care provider. This is important. Contact a health care provider if:  You have a fever.  You have redness, swelling, or yellow drainage around your insertion site. Get help right away if:  You have unusual pain at the radial site.  The catheter insertion area swells very fast.  The insertion area is bleeding, and the bleeding does not stop when you hold steady pressure on the area.  Your arm or hand becomes pale, cool, tingly, or numb. These symptoms may represent a serious   problem that is an emergency. Do not wait to see if the symptoms will go away. Get medical help right away. Call your local emergency services (911 in the [place] Do not drive yourself to the hospital. Summary  After the procedure, it is common to have bruising and tenderness at the site.  Follow instructions from your health care provider about how to take care of your radial site wound. Check the wound every day for signs of infection.  Do not lift anything that is heavier than 10 lb (4.5 kg), or the limit that you are told, until your health care provider says that it  is safe. This information is not intended to replace advice given to you by your health care provider. Make sure you discuss any questions you have with your health care provider. Document Revised: 01/08/2018 Document Reviewed: 01/08/2018 Elsevier Patient Education  2021 [employer]  

## 2021-04-27 NOTE — Interval H&P Note (Signed)
Cath Lab Visit (complete for each Cath Lab visit)  Clinical Evaluation Leading to the Procedure:   ACS: No.  Non-ACS:    Anginal Classification: No Symptoms  Anti-ischemic medical therapy: Maximal Therapy (2 or more classes of medications)  Non-Invasive Test Results: Intermediate-risk stress test findings: cardiac mortality 1-3%/year  Prior CABG: No previous CABG      History and Physical Interval Note:  04/27/2021 7:40 AM  Nancy [name]  has presented today for surgery, with the diagnosis of Positive Stress Test.  The various methods of treatment have been discussed with the patient and family. After consideration of risks, benefits and other options for treatment, the patient has consented to  Procedure(s): RIGHT/LEFT HEART CATH AND CORONARY ANGIOGRAPHY (N/A) as a surgical intervention.  The patient's history has been reviewed, patient examined, no change in status, stable for surgery.  I have reviewed the patient's chart and labs.  Questions were answered to the patient's satisfaction.     Quay Burow

## 2021-04-27 NOTE — Progress Notes (Signed)
Green top with yellow circle sent for BMP to station #[id number] @ [phone]

## 2021-04-28 ENCOUNTER — Encounter (HOSPITAL_COMMUNITY): Payer: Self-pay | Admitting: Cardiovascular Disease

## 2021-04-28 MED FILL — Heparin Sod (Porcine)-NaCl IV Soln 1000 Unit/500ML-0.9%: INTRAVENOUS | Qty: 500 | Status: AC

## 2021-05-02 ENCOUNTER — Encounter (HOSPITAL_COMMUNITY): Payer: No Payment, Other | Admitting: Psychiatry

## 2021-05-03 ENCOUNTER — Encounter (HOSPITAL_COMMUNITY): Payer: Self-pay | Admitting: Cardiology

## 2021-05-09 ENCOUNTER — Encounter (HOSPITAL_COMMUNITY): Payer: Self-pay

## 2021-05-09 ENCOUNTER — Other Ambulatory Visit: Payer: Self-pay

## 2021-05-09 ENCOUNTER — Emergency Department (HOSPITAL_COMMUNITY): Payer: Medicaid Other

## 2021-05-09 ENCOUNTER — Inpatient Hospital Stay (HOSPITAL_COMMUNITY)
Admission: EM | Admit: 2021-05-09 | Discharge: 2021-05-12 | DRG: 291 | Disposition: A | Discharge status: Home / Self Care | Payer: Medicaid Other | Attending: Family Medicine | Admitting: Family Medicine

## 2021-05-09 DIAGNOSIS — Z8349 Family history of other endocrine, nutritional and metabolic diseases: Secondary | ICD-10-CM

## 2021-05-09 DIAGNOSIS — H3530 Unspecified macular degeneration: Secondary | ICD-10-CM | POA: Diagnosis present

## 2021-05-09 DIAGNOSIS — I5033 Acute on chronic diastolic (congestive) heart failure: Secondary | ICD-10-CM | POA: Diagnosis present

## 2021-05-09 DIAGNOSIS — I160 Hypertensive urgency: Secondary | ICD-10-CM | POA: Diagnosis present

## 2021-05-09 DIAGNOSIS — I6521 Occlusion and stenosis of right carotid artery: Secondary | ICD-10-CM | POA: Diagnosis present

## 2021-05-09 DIAGNOSIS — Z794 Long term (current) use of insulin: Secondary | ICD-10-CM

## 2021-05-09 DIAGNOSIS — Z90710 Acquired absence of both cervix and uterus: Secondary | ICD-10-CM

## 2021-05-09 DIAGNOSIS — N1832 Chronic kidney disease, stage 3b: Secondary | ICD-10-CM | POA: Diagnosis present

## 2021-05-09 DIAGNOSIS — F32A Depression, unspecified: Secondary | ICD-10-CM | POA: Diagnosis present

## 2021-05-09 DIAGNOSIS — Z803 Family history of malignant neoplasm of breast: Secondary | ICD-10-CM

## 2021-05-09 DIAGNOSIS — Z8249 Family history of ischemic heart disease and other diseases of the circulatory system: Secondary | ICD-10-CM

## 2021-05-09 DIAGNOSIS — I272 Pulmonary hypertension, unspecified: Secondary | ICD-10-CM | POA: Diagnosis present

## 2021-05-09 DIAGNOSIS — N179 Acute kidney failure, unspecified: Secondary | ICD-10-CM | POA: Diagnosis present

## 2021-05-09 DIAGNOSIS — Z882 Allergy status to sulfonamides status: Secondary | ICD-10-CM

## 2021-05-09 DIAGNOSIS — J9811 Atelectasis: Secondary | ICD-10-CM | POA: Diagnosis present

## 2021-05-09 DIAGNOSIS — Z79899 Other long term (current) drug therapy: Secondary | ICD-10-CM

## 2021-05-09 DIAGNOSIS — I2510 Atherosclerotic heart disease of native coronary artery without angina pectoris: Secondary | ICD-10-CM | POA: Diagnosis present

## 2021-05-09 DIAGNOSIS — E1122 Type 2 diabetes mellitus with diabetic chronic kidney disease: Secondary | ICD-10-CM | POA: Diagnosis present

## 2021-05-09 DIAGNOSIS — A0839 Other viral enteritis: Secondary | ICD-10-CM | POA: Diagnosis present

## 2021-05-09 DIAGNOSIS — Z87891 Personal history of nicotine dependence: Secondary | ICD-10-CM

## 2021-05-09 DIAGNOSIS — E1142 Type 2 diabetes mellitus with diabetic polyneuropathy: Secondary | ICD-10-CM | POA: Diagnosis present

## 2021-05-09 DIAGNOSIS — I10 Essential (primary) hypertension: Secondary | ICD-10-CM

## 2021-05-09 DIAGNOSIS — K3184 Gastroparesis: Secondary | ICD-10-CM | POA: Diagnosis present

## 2021-05-09 DIAGNOSIS — I130 Hypertensive heart and chronic kidney disease with heart failure and stage 1 through stage 4 chronic kidney disease, or unspecified chronic kidney disease: Principal | ICD-10-CM | POA: Diagnosis present

## 2021-05-09 DIAGNOSIS — Z20822 Contact with and (suspected) exposure to covid-19: Secondary | ICD-10-CM | POA: Diagnosis present

## 2021-05-09 DIAGNOSIS — R112 Nausea with vomiting, unspecified: Secondary | ICD-10-CM | POA: Diagnosis present

## 2021-05-09 DIAGNOSIS — E1169 Type 2 diabetes mellitus with other specified complication: Secondary | ICD-10-CM | POA: Diagnosis present

## 2021-05-09 DIAGNOSIS — Z800 Family history of malignant neoplasm of digestive organs: Secondary | ICD-10-CM

## 2021-05-09 DIAGNOSIS — E1143 Type 2 diabetes mellitus with diabetic autonomic (poly)neuropathy: Secondary | ICD-10-CM | POA: Diagnosis present

## 2021-05-09 DIAGNOSIS — Z8673 Personal history of transient ischemic attack (TIA), and cerebral infarction without residual deficits: Secondary | ICD-10-CM

## 2021-05-09 DIAGNOSIS — I509 Heart failure, unspecified: Secondary | ICD-10-CM

## 2021-05-09 DIAGNOSIS — E785 Hyperlipidemia, unspecified: Secondary | ICD-10-CM | POA: Diagnosis present

## 2021-05-09 DIAGNOSIS — Z833 Family history of diabetes mellitus: Secondary | ICD-10-CM

## 2021-05-09 DIAGNOSIS — Z7982 Long term (current) use of aspirin: Secondary | ICD-10-CM

## 2021-05-09 DIAGNOSIS — Z888 Allergy status to other drugs, medicaments and biological substances status: Secondary | ICD-10-CM

## 2021-05-09 DIAGNOSIS — Z9111 Patient's noncompliance with dietary regimen: Secondary | ICD-10-CM

## 2021-05-09 LAB — HIV ANTIBODY (ROUTINE TESTING W REFLEX): HIV Screen 4th Generation wRfx: NONREACTIVE

## 2021-05-09 LAB — RESP PANEL BY RT-PCR (FLU A&B, COVID) ARPGX2
Influenza A by PCR: NEGATIVE
Influenza B by PCR: NEGATIVE
SARS Coronavirus 2 by RT PCR: NEGATIVE

## 2021-05-09 LAB — CBG MONITORING, ED
Glucose-Capillary: 97 mg/dL (ref 70–99)
Glucose-Capillary: 148 mg/dL — ABNORMAL HIGH (ref 70–99)

## 2021-05-09 LAB — COMPREHENSIVE METABOLIC PANEL
ALT: 10 U/L (ref 0–44)
AST: 11 U/L — ABNORMAL LOW (ref 15–41)
Albumin: 3.2 g/dL — ABNORMAL LOW (ref 3.5–5.0)
Alkaline Phosphatase: 55 U/L (ref 38–126)
Anion gap: 6 (ref 5–15)
BUN: 24 mg/dL — ABNORMAL HIGH (ref 8–23)
CO2: 26 mmol/L (ref 22–32)
Calcium: 8.6 mg/dL — ABNORMAL LOW (ref 8.9–10.3)
Chloride: 106 mmol/L (ref 98–111)
Creatinine, Ser: 1.97 mg/dL — ABNORMAL HIGH (ref 0.44–1.00)
GFR, Estimated: 28 mL/min — ABNORMAL LOW (ref >60)
Glucose, Bld: 140 mg/dL — ABNORMAL HIGH (ref 70–99)
Potassium: 4.1 mmol/L (ref 3.5–5.1)
Sodium: 138 mmol/L (ref 135–145)
Total Bilirubin: 0.5 mg/dL (ref 0.3–1.2)
Total Protein: 6.9 g/dL (ref 6.5–8.1)

## 2021-05-09 LAB — CBC
HCT: 28.8 % — ABNORMAL LOW (ref 36.0–46.0)
Hemoglobin: 8.9 g/dL — ABNORMAL LOW (ref 12.0–15.0)
MCH: 29 pg (ref 26.0–34.0)
MCHC: 30.9 g/dL (ref 30.0–36.0)
MCV: 93.8 fL (ref 80.0–100.0)
Platelets: 234 10*3/uL (ref 150–400)
RBC: 3.07 MIL/uL — ABNORMAL LOW (ref 3.87–5.11)
RDW: 13.8 % (ref 11.5–15.5)
WBC: 7.6 10*3/uL (ref 4.0–10.5)
nRBC: 0 % (ref 0.0–0.2)

## 2021-05-09 LAB — TROPONIN I (HIGH SENSITIVITY)
Troponin I (High Sensitivity): 15 ng/L (ref <18)
Troponin I (High Sensitivity): 13 ng/L (ref <18)

## 2021-05-09 LAB — BRAIN NATRIURETIC PEPTIDE: B Natriuretic Peptide: 895 pg/mL — ABNORMAL HIGH (ref 0.0–100.0)

## 2021-05-09 MED ORDER — HYDRALAZINE HCL 20 MG/ML IJ SOLN
10.0000 mg | Freq: Three times a day (TID) | INTRAMUSCULAR | Status: DC | PRN
  Administered 2021-05-10 – 2021-05-11 (×3): 10 mg via INTRAVENOUS
  Filled 2021-05-09 (×3): qty 1

## 2021-05-09 MED ORDER — ONDANSETRON HCL 4 MG/2ML IJ SOLN: 4.0000 mg | Freq: Four times a day (QID) | INTRAMUSCULAR | Status: DC | PRN

## 2021-05-09 MED ORDER — RISPERIDONE 1 MG PO TABS
3.0000 mg | ORAL_TABLET | Freq: Every day | ORAL | Status: DC
  Administered 2021-05-09 – 2021-05-11 (×3): 3 mg via ORAL
  Filled 2021-05-09: qty 3
  Filled 2021-05-09: qty 1
  Filled 2021-05-09: qty 3

## 2021-05-09 MED ORDER — HEPARIN SODIUM (PORCINE) 5000 UNIT/ML IJ SOLN
5000.0000 [IU] | Freq: Three times a day (TID) | INTRAMUSCULAR | Status: DC
  Administered 2021-05-09 – 2021-05-12 (×9): 5000 [IU] via SUBCUTANEOUS
  Filled 2021-05-09 (×9): qty 1

## 2021-05-09 MED ORDER — FUROSEMIDE 10 MG/ML IJ SOLN
20.0000 mg | Freq: Two times a day (BID) | INTRAMUSCULAR | Status: DC
  Administered 2021-05-10 – 2021-05-11 (×3): 20 mg via INTRAVENOUS
  Filled 2021-05-09 (×2): qty 2
  Filled 2021-05-09: qty 4

## 2021-05-09 MED ORDER — FUROSEMIDE 10 MG/ML IJ SOLN: 40.0000 mg | Freq: Two times a day (BID) | INTRAMUSCULAR | Status: DC

## 2021-05-09 MED ORDER — ACETAMINOPHEN 325 MG PO TABS
650.0000 mg | ORAL_TABLET | Freq: Four times a day (QID) | ORAL | Status: DC | PRN
  Administered 2021-05-10 – 2021-05-11 (×4): 650 mg via ORAL
  Filled 2021-05-09 (×4): qty 2

## 2021-05-09 MED ORDER — POLYETHYLENE GLYCOL 3350 17 G PO PACK: 17.0000 g | PACK | Freq: Every day | ORAL | Status: DC | PRN

## 2021-05-09 MED ORDER — ONDANSETRON HCL 4 MG PO TABS: 4.0000 mg | ORAL_TABLET | Freq: Four times a day (QID) | ORAL | Status: DC | PRN

## 2021-05-09 MED ORDER — HYDROMORPHONE HCL 2 MG/ML IJ SOLN
2.0000 mg | Freq: Once | INTRAMUSCULAR | Status: AC
Start: 2021-05-09 — End: 2021-05-09
  Administered 2021-05-09: 2 mg via INTRAVENOUS
  Filled 2021-05-09: qty 1

## 2021-05-09 MED ORDER — METOCLOPRAMIDE HCL 5 MG/ML IJ SOLN
10.0000 mg | Freq: Once | INTRAMUSCULAR | Status: AC
  Administered 2021-05-09: 10 mg via INTRAVENOUS
  Filled 2021-05-09: qty 2

## 2021-05-09 MED ORDER — ACETAMINOPHEN 650 MG RE SUPP: 650.0000 mg | Freq: Four times a day (QID) | RECTAL | Status: DC | PRN

## 2021-05-09 MED ORDER — INSULIN GLARGINE 100 UNIT/ML ~~LOC~~ SOLN
15.0000 [IU] | Freq: Every day | SUBCUTANEOUS | Status: DC
  Administered 2021-05-09 – 2021-05-11 (×3): 15 [IU] via SUBCUTANEOUS
  Filled 2021-05-09 (×3): qty 0.15

## 2021-05-09 MED ORDER — METOPROLOL TARTRATE 5 MG/5ML IV SOLN: 5.0000 mg | Freq: Three times a day (TID) | INTRAVENOUS | Status: DC

## 2021-05-09 MED ORDER — INSULIN ASPART 100 UNIT/ML IJ SOLN
4.0000 [IU] | Freq: Three times a day (TID) | INTRAMUSCULAR | Status: DC
  Administered 2021-05-10 – 2021-05-12 (×5): 4 [IU] via SUBCUTANEOUS
  Filled 2021-05-09: qty 0.04

## 2021-05-09 MED ORDER — ONDANSETRON HCL 4 MG/2ML IJ SOLN
4.0000 mg | Freq: Once | INTRAMUSCULAR | Status: AC
  Administered 2021-05-09: 4 mg via INTRAVENOUS
  Filled 2021-05-09: qty 2

## 2021-05-09 MED ORDER — PANTOPRAZOLE SODIUM 40 MG IV SOLR
40.0000 mg | INTRAVENOUS | Status: DC
  Administered 2021-05-09 – 2021-05-10 (×2): 40 mg via INTRAVENOUS
  Filled 2021-05-09 (×2): qty 40

## 2021-05-09 MED ORDER — FUROSEMIDE 10 MG/ML IJ SOLN
40.0000 mg | Freq: Once | INTRAMUSCULAR | Status: AC
  Administered 2021-05-09: 40 mg via INTRAVENOUS
  Filled 2021-05-09: qty 4

## 2021-05-09 MED ORDER — INSULIN ASPART 100 UNIT/ML IJ SOLN
0.0000 [IU] | Freq: Three times a day (TID) | INTRAMUSCULAR | Status: DC
  Administered 2021-05-10 – 2021-05-11 (×4): 3 [IU] via SUBCUTANEOUS
  Filled 2021-05-09: qty 0.2

## 2021-05-09 NOTE — ED Notes (Signed)
Went to check on patient. She is still awake, but comfortable. No needs at this time.

## 2021-05-09 NOTE — ED Triage Notes (Signed)
Patient c/o of bilateral leg swelling x 2 weeks, but worse today. Patient states she takes Lasix 20 mg bid.

## 2021-05-09 NOTE — H&P (Signed)
History and Physical        Hospital Admission Note Date: 05/09/2021  Patient name: Nancy [name] Medical record number: [id number] Date of birth: 1961/03/21 Age: 60 y.o. Gender: female  PCP: [name], MD    Chief Complaint    Chief Complaint  Patient presents with  . Leg Swelling      HPI:   This is a 60 year old female with past medical history of chronic diastolic CHF, CKD 3b, right carotid artery stenosis, hypertension, hyperlipidemia, pulmonary hypertension, type 2 diabetes with peripheral neuropathy, CVA who presented to the ED with bilateral lower extremity swelling x2 weeks which has been worsening over the past few days.  Associated with a nonproductive cough for several days.  Recent cardiac cath on 04/27/2021 showed mild to moderate CAD and pulmonary hypertension.  She has been compliant with her Lasix 20 mg twice daily which she started 1 month ago but has not had any improvement and in fact worsening of her volume status.  She does admit to orthopnea.  Admits to eating fast food multiple times weekly and will eat "what ever."She has also had nausea and vomiting over the past couple days as well and is actively vomiting during our encounter which is nonbloody.  She denies any fevers, chills or any other complaints.  ED Course: Afebrile, hypertensive, on room air. Notable Labs: Sodium 138, K4.1, glucose 140, BUN 24, creatinine 1.97 (at baseline), BNP 895, troponin negative x2, WBC 7.6, Hb 8.9, COVID-19 pending. Notable Imaging: CXR-streaky lung markings bilaterally which could be interstitial edema or infection, small bilateral pleural effusions and mild left lower lobe atelectasis. Patient received Lasix 40 mg IV x1, Dilaudid, Zofran.  After her IV Lasix in the ED, plan was initially to be discharged home from the ED however she began having nausea and vomiting  (which she did not initially report to the ED doc).  The ED provider is concerned about outpatient diuresis for heart failure and active volume management while actively vomiting, prompting [place] admission   Vitals:   05/09/21 1500 05/09/21 1600  BP: (!) 178/78 (!) 180/94  Pulse: 65 68  Resp: (!) 22 12  Temp:    SpO2: 91% 97%     Review of Systems:  Review of Systems  All other systems reviewed and are negative.   Medical/Social/Family History   Past Medical History: Past Medical History:  Diagnosis Date  . Anemia 2006  . Depression 2014   previously on amitryptiline   . Diabetes mellitus with neurological manifestation ([place]) 2006  . Diabetic peripheral neuropathy ([place]) 04/26/2020  . Fracture of left ankle 1997   . Gastroparesis 07/2016  . HOH (hard of hearing) 2004   . Hyperlipidemia 2006  . Hypertension 2006  . IBS (irritable bowel syndrome) 2002   . Leukopenia 2015   . Macular degeneration 11/2019  . Shingles 2009   . Stroke ([place]) 2020  . Thyroid nodule 2004    Past Surgical History:  Procedure Laterality Date  . ABDOMINAL HYSTERECTOMY  2005  . CATARACT EXTRACTION Left 11/2019  . CESAREAN SECTION  1983   . CHOLECYSTECTOMY N/A 03/05/2020   Procedure: LAPAROSCOPIC CHOLECYSTECTOMY WITH INTRAOPERATIVE CHOLANGIOGRAM;  Surgeon: Donnie Mesa, MD;  Location: [facility];  Service: General;  Laterality: N/A;  . ESOPHAGOGASTRODUODENOSCOPY (EGD) WITH PROPOFOL Left 08/26/2014   Procedure: ESOPHAGOGASTRODUODENOSCOPY (EGD) WITH PROPOFOL;  Surgeon: Arta Silence, MD;  Location: [facility];  Service: Endoscopy;  Laterality: Left;  . ESOPHAGOGASTRODUODENOSCOPY (EGD) WITH PROPOFOL N/A 03/03/2020   Procedure: ESOPHAGOGASTRODUODENOSCOPY (EGD) WITH PROPOFOL;  Surgeon: Jerene Bears, MD;  Location: [facility];  Service: Gastroenterology;  Laterality: N/A;  . RIGHT/LEFT HEART CATH AND CORONARY ANGIOGRAPHY N/A 04/27/2021   Procedure: RIGHT/LEFT HEART CATH AND CORONARY ANGIOGRAPHY;   Surgeon: [name], MD;  Location: [facility];  Service: Cardiovascular;  Laterality: N/A;    Medications: Prior to Admission medications   Medication Sig Start Date End Date Taking? Authorizing Provider  acetaminophen (TYLENOL) 500 MG tablet Take 500 mg by mouth every 6 (six) hours as needed for moderate pain or headache.   Yes [provider]  albuterol (VENTOLIN HFA) 108 (90 Base) MCG/ACT inhaler Inhale 1-2 puffs into the lungs every 6 (six) hours as needed for wheezing or shortness of breath.   Yes [provider]  amLODipine (NORVASC) 2.5 MG tablet Take 1 tablet (2.5 mg total) by mouth daily. 04/18/21  Yes [name], MD  aspirin EC 81 MG tablet Take 1 tablet (81 mg total) by mouth daily. 11/05/19  Yes [name], MD  DULoxetine (CYMBALTA) 20 MG capsule TAKE 1 CAPSULE (20 MG TOTAL) BY MOUTH DAILY. Patient taking differently: Take 20 mg by mouth daily. 02/06/21 02/06/22 Yes Eulis Canner E, NP  furosemide (LASIX) 20 MG tablet Take 2 tablets (40 mg total) by mouth daily. Patient taking differently: Take 20 mg by mouth 2 (two) times daily. 04/18/21  Yes [name], MD  insulin glargine (LANTUS) 100 UNIT/ML Solostar Pen Inject 58 Units into the skin at bedtime. Patient taking differently: Inject 55 Units into the skin at bedtime. 07/05/20  Yes Minette Brine, Amy J, NP  insulin lispro (HUMALOG KWIKPEN) 100 UNIT/ML KwikPen Inject 4 units before breakfast and dinner. 07/05/20  Yes Minette Brine, Amy J, NP  metoprolol tartrate (LOPRESSOR) 50 MG tablet Take 1 tablet (50 mg total) by mouth 2 (two) times daily. 04/18/21  Yes [name], MD  risperiDONE (RISPERDAL) 3 MG tablet TAKE 1 TABLET (3 MG TOTAL) BY MOUTH AT BEDTIME. Patient taking differently: Take 3 mg by mouth at bedtime. 02/06/21 02/06/22 Yes Eulis Canner E, NP  Blood Glucose Monitoring Suppl (ONETOUCH VERIO) w/Device KIT Check blood sugar three times daily. E11.40 05/06/20   [name], MD  Blood Glucose Monitoring Suppl (TRUE METRIX METER) w/Device KIT Check blood sugar three times daily. E11.40 07/11/20   [name], MD  glucose blood test strip Use as instructedCheck blood sugar three times daily. E11.40 07/11/20   [name], MD  mirtazapine (REMERON) 15 MG tablet TAKE 1 TABLET (15 MG TOTAL) BY MOUTH AT BEDTIME. Patient taking differently: Take 15 mg by mouth at bedtime. 02/06/21 02/06/22  Salley Slaughter, NP  Insulin NPH Isophane & Regular (RELION 70/30 [place]) Inject 35 Units into the skin 2 (two) times daily.  08/17/14  [provider]    Allergies:   Allergies  Allergen Reactions  . Elemental Sulfur Hives and Other (See Comments)    PATIENT STATED THIS, MORE THAN LIKELY, SHOULD HAVE BEEN LOGGED AS "SULFA"  . Hydralazine Hcl Other (See Comments)    Hair loss  . Hydrocodone Itching and Other (See Comments)    Upset stomach  .  Metformin And Related Nausea And Vomiting and Other (See Comments)    Stomach pains, also  . Other Nausea Only and Other (See Comments)    Lettuce- Does not digest this!!  . Plaquenil [Hydroxychloroquine Sulfate] Hives  . Shellfish-Derived Products Nausea Only and Other (See Comments)    Caused an upset stomach  . Shrimp (Diagnostic) Nausea Only and Other (See Comments)    Upset stomach   . Sulfa Antibiotics Hives    Social History:  reports that she has quit smoking. Her smoking use included cigarettes. She has a 0.13 pack-year smoking history. She has never used smokeless tobacco. She reports that she does not drink alcohol and does not use drugs.  Family History: Family History  Problem Relation Age of Onset  . Hypertension Mother   . Heart disease Mother   . Diabetes Mother   . Thyroid disease Mother   . Congestive Heart Failure Mother   . Breast cancer Maternal Grandmother   . Colon cancer Maternal Grandfather   . Heart attack Sister   . Heart disease Brother   . Hyperlipidemia Brother   .  Hypertension Brother   . Diabetes Father   . Breast cancer Maternal Aunt      Objective   Physical Exam: Blood pressure (!) 180/94, pulse 68, temperature 98.2 F (36.8 C), temperature source Oral, resp. rate 12, height $RemoveBe'5\' 3"'hkbtDPKmN$  (1.6 m), weight 100.7 kg, SpO2 97 %.  Physical Exam Vitals and nursing note reviewed.  Constitutional:      General: She is not in acute distress.    Appearance: Normal appearance.     Comments: Actively vomiting  HENT:     Head: Normocephalic and atraumatic.  Eyes:     Conjunctiva/sclera: Conjunctivae normal.  Cardiovascular:     Rate and Rhythm: Normal rate and regular rhythm.  Pulmonary:     Effort: Pulmonary effort is normal.     Breath sounds: Rales present.     Comments: Slight audible wheeze Abdominal:     General: Abdomen is flat.     Palpations: Abdomen is soft.  Musculoskeletal:        General: No tenderness.     Comments: 1+ bilateral lower extremity edema  Skin:    Coloration: Skin is not jaundiced or pale.  Neurological:     Mental Status: She is alert. Mental status is at baseline.  Psychiatric:        Mood and Affect: Mood normal.        Behavior: Behavior normal.     LABS on Admission: I have personally reviewed all the labs and imaging below    Basic Metabolic Panel: Recent Labs  Lab 05/09/21 0904  NA 138  K 4.1  CL 106  CO2 26  GLUCOSE 140*  BUN 24*  CREATININE 1.97*  CALCIUM 8.6*   Liver Function Tests: Recent Labs  Lab 05/09/21 0904  AST 11*  ALT 10  ALKPHOS 55  BILITOT 0.5  PROT 6.9  ALBUMIN 3.2*   No results for input(s): LIPASE, AMYLASE in the last 168 hours. No results for input(s): AMMONIA in the last 168 hours. CBC: Recent Labs  Lab 05/09/21 0904  WBC 7.6  HGB 8.9*  HCT 28.8*  MCV 93.8  PLT 234   Cardiac Enzymes: No results for input(s): CKTOTAL, CKMB, CKMBINDEX, TROPONINI in the last 168 hours. BNP: Invalid input(s): POCBNP CBG: No results for input(s): GLUCAP in the last 168  hours.  Radiological Exams on Admission:  DG Chest  2 View  Result Date: 05/09/2021 CLINICAL DATA:  Cough and short of breath EXAM: CHEST - 2 VIEW COMPARISON:  04/20/2020 FINDINGS: Heart size upper normal. There are streaky perihilar lung markings bilaterally which have developed in the interval. This is symmetric. There are small pleural effusions bilaterally. Mild left lower lobe atelectasis. IMPRESSION: Streaky lung markings bilaterally which could be due to interstitial edema or infection. Small bilateral effusions. Mild left lower lobe atelectasis. Electronically Signed   By: Franchot Gallo M.D.   On: 05/09/2021 10:16      EKG: unchanged from previous tracings, normal sinus rhythm, nonspecific ST and T waves changes   A & P   Principal Problem:   Acute CHF (congestive heart failure) (HCC) Active Problems:   Nausea & vomiting   Diabetic gastroparesis associated with type 2 diabetes mellitus ([place])   Hypertension   Hyperlipidemia associated with type 2 diabetes mellitus ([place])   1. Suspected acute on chronic diastolic CHF exacerbation, likely in part from dietary noncompliance  Pulmonary Hypertension a. 04/05/2021 EF: 60 to 62% grade 2 diastolic dysfunction b. 01/16/8656 right/left heart cath: Pulmonary hypertension with high LVEDP and right atrial pressure, mild to moderate CAD c. BNP 800+, troponin negative x2 d. Continue with gentle IV diuresis, monitoring renal function/volume status closely since she is actively vomiting e. Daily weights and intake/output  2. Nausea/vomiting, suspect viral gastritis vs. Diabetic gastroparesis a. Clear liquid diet b. Protonix c. Zofran as needed d. reglan x 1 e. Monitor renal function as she may easily get dehydrated  3. Hypertension a. Hydralazine as needed b. Holding home meds  4. Type 2 diabetes a. Basal bolus insulin with sliding scale, monitor for hypoglycemia on clear liquid diet  5. CKD 3b, at baseline   DVT prophylaxis:  Heparin   Code Status: Full Code  Diet: Clear liquid diet Family Communication: Admission, patients condition and plan of care including tests being ordered have been discussed with the patient who indicates understanding and agrees with the plan and Code Status. Patient's brother was updated  Disposition Plan: The appropriate patient status for this patient is OBSERVATION. Observation status is judged to be reasonable and necessary in order to provide the required intensity of service to ensure the patient's safety. The patient's presenting symptoms, physical exam findings, and initial radiographic and laboratory data in the context of their medical condition is felt to place them at decreased risk for further clinical deterioration. Furthermore, it is anticipated that the patient will be medically stable for discharge from the hospital within 2 midnights of admission. The following factors support the patient status of observation.   " The patient's presenting symptoms include shortness of breath, lower extremity edema, nausea and vomiting. " The physical exam findings include vomiting. " The initial radiographic and laboratory data are elevated BNP.      Consultants  . None  Procedures  . None  Time Spent on Admission: 62 minutes    Harold Hedge, DO [facility]  05/09/2021, 5:03 PM

## 2021-05-09 NOTE — ED Notes (Signed)
Patient ambulated to the bathroom using her cane. She is now back in bed.

## 2021-05-09 NOTE — ED Notes (Signed)
Patient has wheezing in the upper lobes. Patient is alert and oriented X4 and soft spoken. Patient states she feels slightly better since having nausea medicine.

## 2021-05-09 NOTE — ED Provider Notes (Signed)
[facility] DEPT Provider Note   CSN: [id number] Arrival date & time: 05/09/21  6578     History Chief Complaint  Patient presents with  . Leg Swelling    Nancy [name] is a 60 y.o. female.  HPI Patient presents with leg swelling.  Bilateral.  History of heart failure and pulmonary hypertension.  Recent heart catheterization showed no coronary disease.  He is on Lasix 20 mg twice a day and states has been taking it.  States she has more swelling on her legs than baseline but less than it had been previously.  States she is also is short of breath.  Had a cough with no real sputum production.  No fevers.  No numbness or weakness.  No abdominal pain.    Past Medical History:  Diagnosis Date  . Anemia 2006  . Depression 2014   previously on amitryptiline   . Diabetes mellitus with neurological manifestation ([place]) 2006  . Diabetic peripheral neuropathy ([place]) 04/26/2020  . Fracture of left ankle 1997   . Gastroparesis 07/2016  . HOH (hard of hearing) 2004   . Hyperlipidemia 2006  . Hypertension 2006  . IBS (irritable bowel syndrome) 2002   . Leukopenia 2015   . Macular degeneration 11/2019  . Shingles 2009   . Stroke ([place]) 2020  . Thyroid nodule 2004    Patient Active Problem List   Diagnosis Date Noted  . Abnormal nuclear stress test   . Dyspnea on exertion 03/07/2021  . Elevated brain natriuretic peptide (BNP) level 03/02/2021  . Localized edema 02/25/2021  . Orthopnea 02/25/2021  . History of cerebrovascular accident (CVA) with residual deficit 05/06/2020  . Diabetic peripheral neuropathy ([place]) 04/26/2020  . ARF (acute renal failure) ([place]) 04/20/2020  . Generalized weakness 04/20/2020  . Dehydration 04/20/2020  . Generalized abdominal pain   . Pancreatitis, acute 02/29/2020  . Cerebrovascular accident (CVA) ([place]) 11/08/2019  . Stenosis of right carotid artery 11/08/2019  . Hyperlipidemia associated with type 2 diabetes mellitus  ([place]) 11/08/2019  . Cortical age-related cataract of both eyes 08/30/2019  . Gait abnormality 08/30/2019  . Statin declined 08/30/2019  . Gastroesophageal reflux disease without esophagitis 04/18/2018  . 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 Uncontrolled type 2 diabetes mellitus with diabetic neuropathy, with long-term current use of insulin ([place]) 10/29/2017  . History of macular degeneration 10/29/2017  . Chronic cervical pain 10/29/2016  . Myalgia 09/14/2016  . Insomnia 09/14/2016  . Tinea pedis of both feet 08/20/2016  . Macular degeneration 08/17/2016  . Low back pain 09/21/2014  . Hypertension 09/21/2014  . Diabetic gastroparesis associated with type 2 diabetes mellitus ([place]) 09/14/2014  . Thyroid nodule 08/17/2014  . Obesity (BMI 30-39.9) 08/17/2014  . Hyperlipidemia 08/05/2014  . Nausea & vomiting 08/04/2014  . Type II diabetes mellitus with neurological manifestations, uncontrolled ([place]) 08/04/2014  . Elevated serum protein level 08/04/2014    Past Surgical History:  Procedure Laterality Date  . ABDOMINAL HYSTERECTOMY  2005  . CATARACT EXTRACTION Left 11/2019  . CESAREAN SECTION  1983   . CHOLECYSTECTOMY N/A 03/05/2020   Procedure: LAPAROSCOPIC CHOLECYSTECTOMY WITH INTRAOPERATIVE CHOLANGIOGRAM;  Surgeon: Donnie Mesa, MD;  Location: [facility];  Service: General;  Laterality: N/A;  . ESOPHAGOGASTRODUODENOSCOPY (EGD) WITH PROPOFOL Left 08/26/2014   Procedure: ESOPHAGOGASTRODUODENOSCOPY (EGD) WITH PROPOFOL;  Surgeon: Arta Silence, MD;  Location: [facility];  Service: Endoscopy;  Laterality: Left;  . ESOPHAGOGASTRODUODENOSCOPY (EGD) WITH PROPOFOL N/A 03/03/2020   Procedure: ESOPHAGOGASTRODUODENOSCOPY (EGD) WITH PROPOFOL;  Surgeon: [name],  Lajuan Lines, MD;  Location: [facility];  Service: Gastroenterology;  Laterality: N/A;  . RIGHT/LEFT HEART CATH AND CORONARY ANGIOGRAPHY N/A 04/27/2021   Procedure: RIGHT/LEFT HEART CATH AND CORONARY ANGIOGRAPHY;  Surgeon: [name], MD;  Location:  [facility];  Service: Cardiovascular;  Laterality: N/A;     OB History   No obstetric history on file.     Family History  Problem Relation Age of Onset  . Hypertension Mother   . Heart disease Mother   . Diabetes Mother   . Thyroid disease Mother   . Congestive Heart Failure Mother   . Breast cancer Maternal Grandmother   . Colon cancer Maternal Grandfather   . Heart attack Sister   . Heart disease Brother   . Hyperlipidemia Brother   . Hypertension Brother   . Diabetes Father   . Breast cancer Maternal Aunt     Social History   Tobacco Use  . Smoking status: Former Smoker    Packs/day: 0.25    Years: 0.50    Pack years: 0.12    Types: Cigarettes  . Smokeless tobacco: Never Used  . Tobacco comment: quit yrs ago  Vaping Use  . Vaping Use: Never used  Substance Use Topics  . Alcohol use: No  . Drug use: No    Home Medications Prior to Admission medications   Medication Sig Start Date End Date Taking? Authorizing Provider  acetaminophen (TYLENOL) 500 MG tablet Take 500 mg by mouth every 6 (six) hours as needed for moderate pain or headache.   Yes [provider]  albuterol (VENTOLIN HFA) 108 (90 Base) MCG/ACT inhaler Inhale 1-2 puffs into the lungs every 6 (six) hours as needed for wheezing or shortness of breath.   Yes [provider]  amLODipine (NORVASC) 2.5 MG tablet Take 1 tablet (2.5 mg total) by mouth daily. 04/18/21  Yes [name], MD  aspirin EC 81 MG tablet Take 1 tablet (81 mg total) by mouth daily. 11/05/19  Yes [name], MD  DULoxetine (CYMBALTA) 20 MG capsule TAKE 1 CAPSULE (20 MG TOTAL) BY MOUTH DAILY. Patient taking differently: Take 20 mg by mouth daily. 02/06/21 02/06/22 Yes Eulis Canner E, NP  furosemide (LASIX) 20 MG tablet Take 2 tablets (40 mg total) by mouth daily. Patient taking differently: Take 20 mg by mouth 2 (two) times daily. 04/18/21  Yes [name], MD  insulin glargine (LANTUS) 100  UNIT/ML Solostar Pen Inject 58 Units into the skin at bedtime. Patient taking differently: Inject 55 Units into the skin at bedtime. 07/05/20  Yes Minette Brine, Amy J, NP  insulin lispro (HUMALOG KWIKPEN) 100 UNIT/ML KwikPen Inject 4 units before breakfast and dinner. 07/05/20  Yes Minette Brine, Amy J, NP  metoprolol tartrate (LOPRESSOR) 50 MG tablet Take 1 tablet (50 mg total) by mouth 2 (two) times daily. 04/18/21  Yes [name], MD  risperiDONE (RISPERDAL) 3 MG tablet TAKE 1 TABLET (3 MG TOTAL) BY MOUTH AT BEDTIME. Patient taking differently: Take 3 mg by mouth at bedtime. 02/06/21 02/06/22 Yes Eulis Canner E, NP  Blood Glucose Monitoring Suppl (ONETOUCH VERIO) w/Device KIT Check blood sugar three times daily. E11.40 05/06/20   [name], MD  Blood Glucose Monitoring Suppl (TRUE METRIX METER) w/Device KIT Check blood sugar three times daily. E11.40 07/11/20   [name], MD  glucose blood test strip Use as instructedCheck blood sugar three times daily. E11.40 07/11/20   [name], MD  mirtazapine (REMERON) 15 MG tablet TAKE 1 TABLET (15 MG TOTAL) BY MOUTH AT BEDTIME. Patient taking differently: Take 15 mg by mouth at bedtime. 02/06/21 02/06/22  Salley Slaughter, NP  Insulin NPH Isophane & Regular (RELION 70/30 [place]) Inject 35 Units into the skin 2 (two) times daily.  08/17/14  [provider]    Allergies    Elemental sulfur, Hydralazine hcl, Hydrocodone, Metformin and related, Other, Plaquenil [hydroxychloroquine sulfate], Shellfish-derived products, Shrimp (diagnostic), and Sulfa antibiotics  Review of Systems   Review of Systems  Constitutional: Negative for appetite change and fever.  Respiratory: Positive for cough, shortness of breath and wheezing.   Cardiovascular: Positive for leg swelling. Negative for chest pain.  Gastrointestinal: Negative for abdominal pain.  Genitourinary: Negative for flank pain.  Musculoskeletal: Negative for gait problem.  Skin:  Negative for rash.  Neurological: Negative for weakness.  Psychiatric/Behavioral: Negative for confusion.    Physical Exam Updated Vital Signs BP (!) 178/78   Pulse 65   Temp 98.2 F (36.8 C) (Oral)   Resp (!) 22   Ht _0  (1.6 m)   Wt 100.7 kg   SpO2 91%   BMI 39.33 kg/m   Physical Exam Vitals and nursing note reviewed.  HENT:     Head: Normocephalic.     Right Ear: External ear normal.     Left Ear: External ear normal.  Eyes:     Pupils: Pupils are equal, round, and reactive to light.  Cardiovascular:     Rate and Rhythm: Normal rate.  Pulmonary:     Breath sounds: Wheezing present.     Comments: Mild wheezes without focal rales or rhonchi. Musculoskeletal:     Cervical back: Neck supple.     Right lower leg: Edema present.     Left lower leg: Edema present.     Comments: Pitting edema bilateral lower extremities  Skin:    Capillary Refill: Capillary refill takes less than 2 seconds.  Neurological:     Mental Status: She is alert and oriented to person, place, and time.     ED Results / Procedures / Treatments   Labs (all labs ordered are listed, but only abnormal results are displayed) Labs Reviewed  BRAIN NATRIURETIC PEPTIDE - Abnormal; Notable for the following components:      Result Value   B Natriuretic Peptide 895.0 (*)    All other components within normal limits  CBC - Abnormal; Notable for the following components:   RBC 3.07 (*)    Hemoglobin 8.9 (*)    HCT 28.8 (*)    All other components within normal limits  COMPREHENSIVE METABOLIC PANEL - Abnormal; Notable for the following components:   Glucose, Bld 140 (*)    BUN 24 (*)    Creatinine, Ser 1.97 (*)    Calcium 8.6 (*)    Albumin 3.2 (*)    AST 11 (*)    GFR, Estimated 28 (*)    All other components within normal limits  RESP PANEL BY RT-PCR (FLU A&B, COVID) ARPGX2  TROPONIN I (HIGH SENSITIVITY)  TROPONIN I (HIGH SENSITIVITY)    EKG EKG Interpretation  Date/Time:  Tuesday May 09 2021 09:11:14 EDT Ventricular Rate:  68 PR Interval:  154 QRS Duration: 80 QT Interval:  452 QTC Calculation: 481 R Axis:   2 Text Interpretation: Sinus rhythm Consider anterior infarct Borderline T abnormalities, inferior leads Baseline wander in lead(s) V4 12 Lead; [place] No significant change since last tracing Confirmed  by Davonna Belling [phone]) on 05/09/2021 9:14:58 AM   Radiology DG Chest 2 View  Result Date: 05/09/2021 CLINICAL DATA:  Cough and short of breath EXAM: CHEST - 2 VIEW COMPARISON:  04/20/2020 FINDINGS: Heart size upper normal. There are streaky perihilar lung markings bilaterally which have developed in the interval. This is symmetric. There are small pleural effusions bilaterally. Mild left lower lobe atelectasis. IMPRESSION: Streaky lung markings bilaterally which could be due to interstitial edema or infection. Small bilateral effusions. Mild left lower lobe atelectasis. Electronically Signed   By: Franchot Gallo M.D.   On: 05/09/2021 10:16    Procedures Procedures   Medications Ordered in ED Medications  ondansetron (ZOFRAN) injection 4 mg (has no administration in time range)  furosemide (LASIX) injection 40 mg (40 mg Intravenous Given 05/09/21 1401)  HYDROmorphone (DILAUDID) injection 2 mg (2 mg Intravenous Given 05/09/21 1450)    ED Course  I have reviewed the triage vital signs and the nursing notes.  Pertinent labs & imaging results that were available during my care of the patient were reviewed by me and considered in my medical decision making (see chart for details).    MDM Rules/Calculators/A&P                          Patient presents with shortness of breath and increasing peripheral edema.  History of CHF and pulmonary hypertension.  Has been coughing more with some sputum production.  Chest x-ray shows some volume overload.  BNP is above baseline.  Creatinine appears to be at around baseline at 2, IV Lasix given and had some output, but  patient then had more nausea and vomiting.  Patient now states she is had more nausea and vomiting the last couple days.  Unable to tolerate orals at this time.  I do not think she is a good candidate for outpatient diuresis/fluid adjustment when she is actively vomiting.  Will discuss with hospitalist.  Had had previous acute kidney injury when she had become over diuresed. Final Clinical Impression(s) / ED Diagnoses Final diagnoses:  Congestive heart failure, unspecified HF chronicity, unspecified heart failure type (HCC)  Nausea and vomiting, intractability of vomiting not specified, unspecified vomiting type    Rx / DC Orders ED Discharge Orders    None       Davonna Belling, MD 05/09/21 1554

## 2021-05-10 DIAGNOSIS — A0839 Other viral enteritis: Secondary | ICD-10-CM | POA: Diagnosis present

## 2021-05-10 DIAGNOSIS — Z8349 Family history of other endocrine, nutritional and metabolic diseases: Secondary | ICD-10-CM | POA: Diagnosis not present

## 2021-05-10 DIAGNOSIS — Z833 Family history of diabetes mellitus: Secondary | ICD-10-CM | POA: Diagnosis not present

## 2021-05-10 DIAGNOSIS — N179 Acute kidney failure, unspecified: Secondary | ICD-10-CM | POA: Diagnosis present

## 2021-05-10 DIAGNOSIS — Z8249 Family history of ischemic heart disease and other diseases of the circulatory system: Secondary | ICD-10-CM | POA: Diagnosis not present

## 2021-05-10 DIAGNOSIS — Z803 Family history of malignant neoplasm of breast: Secondary | ICD-10-CM | POA: Diagnosis not present

## 2021-05-10 DIAGNOSIS — E1169 Type 2 diabetes mellitus with other specified complication: Secondary | ICD-10-CM | POA: Diagnosis present

## 2021-05-10 DIAGNOSIS — I2510 Atherosclerotic heart disease of native coronary artery without angina pectoris: Secondary | ICD-10-CM | POA: Diagnosis present

## 2021-05-10 DIAGNOSIS — E785 Hyperlipidemia, unspecified: Secondary | ICD-10-CM | POA: Diagnosis present

## 2021-05-10 DIAGNOSIS — Z79899 Other long term (current) drug therapy: Secondary | ICD-10-CM | POA: Diagnosis not present

## 2021-05-10 DIAGNOSIS — J9811 Atelectasis: Secondary | ICD-10-CM | POA: Diagnosis present

## 2021-05-10 DIAGNOSIS — E1142 Type 2 diabetes mellitus with diabetic polyneuropathy: Secondary | ICD-10-CM | POA: Diagnosis present

## 2021-05-10 DIAGNOSIS — I6521 Occlusion and stenosis of right carotid artery: Secondary | ICD-10-CM | POA: Diagnosis present

## 2021-05-10 DIAGNOSIS — F32A Depression, unspecified: Secondary | ICD-10-CM | POA: Diagnosis present

## 2021-05-10 DIAGNOSIS — I509 Heart failure, unspecified: Secondary | ICD-10-CM | POA: Diagnosis present

## 2021-05-10 DIAGNOSIS — N1832 Chronic kidney disease, stage 3b: Secondary | ICD-10-CM | POA: Diagnosis present

## 2021-05-10 DIAGNOSIS — E1122 Type 2 diabetes mellitus with diabetic chronic kidney disease: Secondary | ICD-10-CM | POA: Diagnosis present

## 2021-05-10 DIAGNOSIS — I272 Pulmonary hypertension, unspecified: Secondary | ICD-10-CM | POA: Diagnosis present

## 2021-05-10 DIAGNOSIS — I130 Hypertensive heart and chronic kidney disease with heart failure and stage 1 through stage 4 chronic kidney disease, or unspecified chronic kidney disease: Secondary | ICD-10-CM | POA: Diagnosis not present

## 2021-05-10 DIAGNOSIS — E1143 Type 2 diabetes mellitus with diabetic autonomic (poly)neuropathy: Secondary | ICD-10-CM | POA: Diagnosis present

## 2021-05-10 DIAGNOSIS — I5033 Acute on chronic diastolic (congestive) heart failure: Secondary | ICD-10-CM | POA: Diagnosis present

## 2021-05-10 DIAGNOSIS — H3530 Unspecified macular degeneration: Secondary | ICD-10-CM | POA: Diagnosis present

## 2021-05-10 DIAGNOSIS — Z20822 Contact with and (suspected) exposure to covid-19: Secondary | ICD-10-CM | POA: Diagnosis present

## 2021-05-10 DIAGNOSIS — K3184 Gastroparesis: Secondary | ICD-10-CM | POA: Diagnosis present

## 2021-05-10 DIAGNOSIS — Z8673 Personal history of transient ischemic attack (TIA), and cerebral infarction without residual deficits: Secondary | ICD-10-CM | POA: Diagnosis not present

## 2021-05-10 LAB — CBC
HCT: 30.1 % — ABNORMAL LOW (ref 36.0–46.0)
Hemoglobin: 9.1 g/dL — ABNORMAL LOW (ref 12.0–15.0)
MCH: 28.3 pg (ref 26.0–34.0)
MCHC: 30.2 g/dL (ref 30.0–36.0)
MCV: 93.8 fL (ref 80.0–100.0)
Platelets: 224 10*3/uL (ref 150–400)
RBC: 3.21 MIL/uL — ABNORMAL LOW (ref 3.87–5.11)
RDW: 13.6 % (ref 11.5–15.5)
WBC: 8.8 10*3/uL (ref 4.0–10.5)
nRBC: 0 % (ref 0.0–0.2)

## 2021-05-10 LAB — BASIC METABOLIC PANEL
Anion gap: 7 (ref 5–15)
BUN: 29 mg/dL — ABNORMAL HIGH (ref 8–23)
CO2: 27 mmol/L (ref 22–32)
Calcium: 9 mg/dL (ref 8.9–10.3)
Chloride: 105 mmol/L (ref 98–111)
Creatinine, Ser: 2.31 mg/dL — ABNORMAL HIGH (ref 0.44–1.00)
GFR, Estimated: 23 mL/min — ABNORMAL LOW (ref >60)
Glucose, Bld: 156 mg/dL — ABNORMAL HIGH (ref 70–99)
Potassium: 4.7 mmol/L (ref 3.5–5.1)
Sodium: 139 mmol/L (ref 135–145)

## 2021-05-10 LAB — GLUCOSE, CAPILLARY
Glucose-Capillary: 136 mg/dL — ABNORMAL HIGH (ref 70–99)
Glucose-Capillary: 144 mg/dL — ABNORMAL HIGH (ref 70–99)
Glucose-Capillary: 87 mg/dL (ref 70–99)

## 2021-05-10 LAB — CBG MONITORING, ED: Glucose-Capillary: 131 mg/dL — ABNORMAL HIGH (ref 70–99)

## 2021-05-10 NOTE — ED Notes (Signed)
Breakfast tray given. °

## 2021-05-10 NOTE — ED Notes (Signed)
Patient complains of back pain. Tylenol given. Pain is 7/10.

## 2021-05-10 NOTE — ED Notes (Signed)
Nancy [name], her husband call to check on the condition of the patient. Patient gave permission to speak with him.

## 2021-05-10 NOTE — ED Notes (Signed)
Patient is asleep.  

## 2021-05-10 NOTE — Progress Notes (Addendum)
PROGRESS NOTE    Nancy [name]  WNU:[id number] DOB: [birth year] DOA: 05/09/2021 PCP: [name], MD   Brief Narrative: This 60 years old female with PMH significant for chronic diastolic CHF, CKD stage IIIb, right carotid artery stenosis, hypertension, hyperlipidemia, pulmonary hypertension, type 2 diabetes with peripheral neuropathy, CVA presented in the ED with bilateral lower extremity swelling for 2 weeks which has been worsening over the past few days. Patient also reported nonproductive cough for several days.  Patient reports orthopnea, weight gain despite taking her scheduled Lasix dosing twice daily.  Patient is admitted for suspected acute on chronic CHF exacerbation due to dietary noncompliance.  Assessment & Plan:   Principal Problem:   Acute CHF (congestive heart failure) (HCC) Active Problems:   Nausea & vomiting   Diabetic gastroparesis associated with type 2 diabetes mellitus ([place])   Hypertension   Hyperlipidemia associated with type 2 diabetes mellitus ([place])  Suspected acute on chronic diastolic CHF exacerbation likely due to dietary noncompliance. Patient presented with orthopnea, weight gain, bilateral pedal edema, nonproductive cough. 04/05/2021 Echo showed EF 60 to 65%,  grade 2 diastolic dysfunction. BNP 800+, troponin negative. Continue gentle IV diuresis with lasix,  monitor intake output charting.  Daily weight.  Nausea and vomiting:  Could be multifactorial,  viral gastritis versus diabetic gastroparesis Continue clear liquid diet,  continue Protonix,  Zofran as needed. Reglan as needed.  Hypertension: Continue home medications hydralazine as needed  Diabetes: Continue sliding scale. Continue home Lantus.  AKI on CKD stage IIIb Avoid nephrotoxic medications.   Recheck labs tomorrow morning. Consider nephro consult if creatinine continues to trend up   DVT prophylaxis: Heparin Code Status:  Full code. Family Communication: No family  at bed side. Disposition Plan:  Status is: Inpatient  Remains inpatient appropriate because:Inpatient level of care appropriate due to severity of illness   Dispo: The patient is from: Home              Anticipated d/c is to: Home              Patient currently is not medically stable to d/c.   Difficult to place patient No  Consultants:   None  Procedures: Antimicrobials:   Anti-infectives (From admission, onward)   None      Subjective: Patient was seen and examined at bedside.  Overnight events noted.   She reports feeling better but is still feels short of breath.  Objective: Vitals:   05/10/21 1017 05/10/21 1132 05/10/21 1151 05/10/21 1358  BP: (!) 160/74 (!) 166/76  (!) 163/71  Pulse: 78 82  84  Resp: 14 16  18   Temp:  98.1 F (36.7 C)  98.2 F (36.8 C)  TempSrc:      SpO2: 99% 100%  100%  Weight:   100.9 kg   Height:   5\' 3"  (1.6 m)    No intake or output data in the 24 hours ending 05/10/21 1641 Filed Weights   05/09/21 0840 05/10/21 1151  Weight: 100.7 kg 100.9 kg    Examination:  General exam: Appears calm and comfortable, no acute distress. Respiratory system: Clear to auscultation. Respiratory effort normal. Cardiovascular system: S1 & S2 heard, RRR. No JVD, murmurs, rubs, gallops or clicks. No pedal edema. Gastrointestinal system: Abdomen is nondistended, soft and nontender. No organomegaly or masses felt. Normal bowel sounds heard. Central nervous system: Alert and oriented. No focal neurological deficits. Extremities: Symmetric 5 x 5 power.  Pitting edema+, no cyanosis, no clubbing.  Skin: No rashes, lesions or ulcers Psychiatry: Judgement and insight appear normal. Mood & affect appropriate.     Data Reviewed: I have personally reviewed following labs and imaging studies  CBC: Recent Labs  Lab 05/09/21 0904 05/10/21 0500  WBC 7.6 8.8  HGB 8.9* 9.1*  HCT 28.8* 30.1*  MCV 93.8 93.8  PLT 234 093   Basic Metabolic Panel: Recent  Labs  Lab 05/09/21 0904 05/10/21 0500  NA 138 139  K 4.1 4.7  CL 106 105  CO2 26 27  GLUCOSE 140* 156*  BUN 24* 29*  CREATININE 1.97* 2.31*  CALCIUM 8.6* 9.0   GFR: Estimated Creatinine Clearance: 29 mL/min (A) (by C-G formula based on SCr of 2.31 mg/dL (H)). Liver Function Tests: Recent Labs  Lab 05/09/21 0904  AST 11*  ALT 10  ALKPHOS 55  BILITOT 0.5  PROT 6.9  ALBUMIN 3.2*   No results for input(s): LIPASE, AMYLASE in the last 168 hours. No results for input(s): AMMONIA in the last 168 hours. Coagulation Profile: No results for input(s): INR, PROTIME in the last 168 hours. Cardiac Enzymes: No results for input(s): CKTOTAL, CKMB, CKMBINDEX, TROPONINI in the last 168 hours. BNP (last 3 results) No results for input(s): PROBNP in the last 8760 hours. HbA1C: No results for input(s): HGBA1C in the last 72 hours. CBG: Recent Labs  Lab 05/09/21 1710 05/09/21 2312 05/10/21 0815 05/10/21 1135  GLUCAP 97 148* 131* 136*   Lipid Profile: No results for input(s): CHOL, HDL, LDLCALC, TRIG, CHOLHDL, LDLDIRECT in the last 72 hours. Thyroid Function Tests: No results for input(s): TSH, T4TOTAL, FREET4, T3FREE, THYROIDAB in the last 72 hours. Anemia Panel: No results for input(s): VITAMINB12, FOLATE, FERRITIN, TIBC, IRON, RETICCTPCT in the last 72 hours. Sepsis Labs: No results for input(s): PROCALCITON, LATICACIDVEN in the last 168 hours.  Recent Results (from the past 240 hour(s))  Resp Panel by RT-PCR (Flu A&B, Covid) Nasopharyngeal Swab     Status: None   Collection Time: 05/09/21  3:53 PM   Specimen: Nasopharyngeal Swab; Nasopharyngeal(NP) swabs in vial transport medium  Result Value Ref Range Status   SARS Coronavirus 2 by RT PCR NEGATIVE NEGATIVE Final    Comment: (NOTE) SARS-CoV-2 target nucleic acids are NOT DETECTED.  The SARS-CoV-2 RNA is generally detectable in upper respiratory specimens during the acute phase of infection. The lowest concentration of  SARS-CoV-2 viral copies this assay can detect is 138 copies/mL. A negative result does not preclude SARS-Cov-2 infection and should not be used as the sole basis for treatment or other patient management decisions. A negative result may occur with  improper specimen collection/handling, submission of specimen other than nasopharyngeal swab, presence of viral mutation(s) within the areas targeted by this assay, and inadequate number of viral copies(<138 copies/mL). A negative result must be combined with clinical observations, patient history, and epidemiological information. The expected result is Negative.  Fact Sheet for Patients:  EntrepreneurPulse.com.au  Fact Sheet for Healthcare Providers:  IncredibleEmployment.be  This test is no t yet approved or cleared by the [place] FDA and  has been authorized for detection and/or diagnosis of SARS-CoV-2 by FDA under an Emergency Use Authorization (EUA). This EUA will remain  in effect (meaning this test can be used) for the duration of the COVID-19 declaration under Section 564(b)(1) of the Act, 21 U.S.C.section 360bbb-3(b)(1), unless the authorization is terminated  or revoked sooner.       Influenza A by PCR NEGATIVE NEGATIVE Final   Influenza B  by PCR NEGATIVE NEGATIVE Final    Comment: (NOTE) The Xpert Xpress SARS-CoV-2/FLU/RSV plus assay is intended as an aid in the diagnosis of influenza from Nasopharyngeal swab specimens and should not be used as a sole basis for treatment. Nasal washings and aspirates are unacceptable for Xpert Xpress SARS-CoV-2/FLU/RSV testing.  Fact Sheet for Patients: EntrepreneurPulse.com.au  Fact Sheet for Healthcare Providers: IncredibleEmployment.be  This test is not yet approved or cleared by the [place] FDA and has been authorized for detection and/or diagnosis of SARS-CoV-2 by FDA under an Emergency Use  Authorization (EUA). This EUA will remain in effect (meaning this test can be used) for the duration of the COVID-19 declaration under Section 564(b)(1) of the Act, 21 U.S.C. section 360bbb-3(b)(1), unless the authorization is terminated or revoked.  Performed at [facility], [place] [address] [place],  [address]      Radiology Studies: DG Chest 2 View  Result Date: 05/09/2021 CLINICAL DATA:  Cough and short of breath EXAM: CHEST - 2 VIEW COMPARISON:  04/20/2020 FINDINGS: Heart size upper normal. There are streaky perihilar lung markings bilaterally which have developed in the interval. This is symmetric. There are small pleural effusions bilaterally. Mild left lower lobe atelectasis. IMPRESSION: Streaky lung markings bilaterally which could be due to interstitial edema or infection. Small bilateral effusions. Mild left lower lobe atelectasis. Electronically Signed   By: Franchot Gallo M.D.   On: 05/09/2021 10:16   Scheduled Meds: . furosemide  20 mg Intravenous BID  . heparin  5,000 Units Subcutaneous Q8H  . insulin aspart  0-20 Units Subcutaneous TID WC  . insulin aspart  4 Units Subcutaneous TID WC  . insulin glargine  15 Units Subcutaneous QHS  . pantoprazole (PROTONIX) IV  40 mg Intravenous Q24H  . risperiDONE  3 mg Oral QHS   Continuous Infusions:   LOS: 0 days    Time spent: 35 mins    Margaretmary Prisk, MD [facility]   If 7PM-7AM, please contact night-coverage

## 2021-05-11 LAB — BASIC METABOLIC PANEL
Anion gap: 8 (ref 5–15)
BUN: 27 mg/dL — ABNORMAL HIGH (ref 8–23)
CO2: 24 mmol/L (ref 22–32)
Calcium: 9.2 mg/dL (ref 8.9–10.3)
Chloride: 105 mmol/L (ref 98–111)
Creatinine, Ser: 2.32 mg/dL — ABNORMAL HIGH (ref 0.44–1.00)
GFR, Estimated: 23 mL/min — ABNORMAL LOW (ref >60)
Glucose, Bld: 116 mg/dL — ABNORMAL HIGH (ref 70–99)
Potassium: 4.8 mmol/L (ref 3.5–5.1)
Sodium: 137 mmol/L (ref 135–145)

## 2021-05-11 LAB — CBC
HCT: 30.5 % — ABNORMAL LOW (ref 36.0–46.0)
Hemoglobin: 9.5 g/dL — ABNORMAL LOW (ref 12.0–15.0)
MCH: 28.3 pg (ref 26.0–34.0)
MCHC: 31.1 g/dL (ref 30.0–36.0)
MCV: 90.8 fL (ref 80.0–100.0)
Platelets: 167 10*3/uL (ref 150–400)
RBC: 3.36 MIL/uL — ABNORMAL LOW (ref 3.87–5.11)
RDW: 13.6 % (ref 11.5–15.5)
WBC: 9.1 10*3/uL (ref 4.0–10.5)
nRBC: 0 % (ref 0.0–0.2)

## 2021-05-11 LAB — GLUCOSE, CAPILLARY
Glucose-Capillary: 120 mg/dL — ABNORMAL HIGH (ref 70–99)
Glucose-Capillary: 98 mg/dL (ref 70–99)
Glucose-Capillary: 141 mg/dL — ABNORMAL HIGH (ref 70–99)
Glucose-Capillary: 140 mg/dL — ABNORMAL HIGH (ref 70–99)

## 2021-05-11 LAB — PHOSPHORUS: Phosphorus: 3.7 mg/dL (ref 2.5–4.6)

## 2021-05-11 LAB — HEMOGLOBIN A1C
Hgb A1c MFr Bld: 7.1 % — ABNORMAL HIGH (ref 4.8–5.6)
Mean Plasma Glucose: 157 mg/dL

## 2021-05-11 LAB — MAGNESIUM: Magnesium: 1.9 mg/dL (ref 1.7–2.4)

## 2021-05-11 MED ORDER — DULOXETINE HCL 20 MG PO CPEP
20.0000 mg | ORAL_CAPSULE | Freq: Every day | ORAL | Status: DC
  Administered 2021-05-11 – 2021-05-12 (×2): 20 mg via ORAL
  Filled 2021-05-11 (×2): qty 1

## 2021-05-11 MED ORDER — PANTOPRAZOLE SODIUM 40 MG PO TBEC
40.0000 mg | DELAYED_RELEASE_TABLET | Freq: Every day | ORAL | Status: DC
  Administered 2021-05-11: 40 mg via ORAL
  Filled 2021-05-11: qty 1

## 2021-05-11 MED ORDER — ASPIRIN EC 81 MG PO TBEC
81.0000 mg | DELAYED_RELEASE_TABLET | Freq: Every day | ORAL | Status: DC
  Administered 2021-05-11 – 2021-05-12 (×2): 81 mg via ORAL
  Filled 2021-05-11 (×2): qty 1

## 2021-05-11 MED ORDER — FUROSEMIDE 10 MG/ML IJ SOLN
40.0000 mg | Freq: Two times a day (BID) | INTRAMUSCULAR | Status: DC
  Administered 2021-05-11: 40 mg via INTRAVENOUS
  Filled 2021-05-11: qty 4

## 2021-05-11 MED ORDER — METOPROLOL TARTRATE 50 MG PO TABS
50.0000 mg | ORAL_TABLET | Freq: Two times a day (BID) | ORAL | Status: DC
  Administered 2021-05-11 – 2021-05-12 (×3): 50 mg via ORAL
  Filled 2021-05-11 (×3): qty 1

## 2021-05-11 MED ORDER — MIRTAZAPINE 15 MG PO TABS
15.0000 mg | ORAL_TABLET | Freq: Every day | ORAL | Status: DC
  Administered 2021-05-11: 15 mg via ORAL
  Filled 2021-05-11: qty 1

## 2021-05-11 MED ORDER — LABETALOL HCL 5 MG/ML IV SOLN: 20.0000 mg | INTRAVENOUS | Status: DC | PRN

## 2021-05-11 MED ORDER — AMLODIPINE BESYLATE 5 MG PO TABS
2.5000 mg | ORAL_TABLET | Freq: Every day | ORAL | Status: DC
  Administered 2021-05-11 – 2021-05-12 (×2): 2.5 mg via ORAL
  Filled 2021-05-11 (×2): qty 1

## 2021-05-11 NOTE — Progress Notes (Signed)
Patient with elevated B/P 191/85. See mar for prn given. Day shift RN updated.  B/P recheck follow up approximately one hour.

## 2021-05-11 NOTE — Progress Notes (Signed)

## 2021-05-11 NOTE — Progress Notes (Signed)
PROGRESS NOTE    Nancy [name]  YNW:[id number] DOB: 20-Oct-1961 DOA: 05/09/2021 PCP: [name], MD   Brief Narrative: This 60 years old female with PMH significant for chronic diastolic CHF, CKD stage IIIb, right carotid artery stenosis, hypertension, hyperlipidemia, pulmonary hypertension, type 2 diabetes with peripheral neuropathy, CVA presented in the ED with bilateral lower extremity swelling for 2 weeks which has been worsening over the past few days. Patient also reported nonproductive cough for several days.  Patient reports orthopnea, weight gain despite taking her scheduled Lasix dosing twice daily.  Patient is admitted for suspected acute on chronic CHF exacerbation due to dietary noncompliance.  Assessment & Plan:   Principal Problem:   Acute CHF (congestive heart failure) (HCC) Active Problems:   Nausea & vomiting   Diabetic gastroparesis associated with type 2 diabetes mellitus ([place])   Hypertension   Hyperlipidemia associated with type 2 diabetes mellitus ([place])  Suspected acute on chronic diastolic CHF exacerbation likely due to dietary noncompliance. Patient presented with orthopnea, weight gain, bilateral pedal edema, nonproductive cough. 04/05/2021 Echo showed EF 60 to 65%,  grade 2 diastolic dysfunction. BNP 800+, troponin negative. Continue gentle IV diuresis with lasix, Monitor intake output charting.  Daily weight. 0.5 L negative balance.  Nausea and vomiting: > Resolved. Could be multifactorial,  viral gastritis versus diabetic gastroparesis. Continue clear liquid diet,  continue Protonix,  Zofran as needed. Reglan as needed.  Hypertension: Continue home medications,  Hydralazine as needed  Diabetes: Continue sliding scale. Continue home Lantus.  AKI on CKD stage IIIb Avoid nephrotoxic medications.   Recheck labs tomorrow morning. Consider nephro consult if creatinine continues to trend up.   DVT prophylaxis: Heparin Code Status:  Full  code. Family Communication: No family at bed side. Disposition Plan:  Status is: Inpatient  Remains inpatient appropriate because:Inpatient level of care appropriate due to severity of illness   Dispo: The patient is from: Home              Anticipated d/c is to: Home on 5/27              Patient currently is not medically stable to d/c.   Difficult to place patient No  Consultants:   None  Procedures: Antimicrobials:   Anti-infectives (From admission, onward)   None      Subjective: Patient was seen and examined at bedside.  Overnight events noted.   She reports feeling better , still reports having bilateral leg swelling. Patient denies any chest pain or shortness of breath at rest.  Objective: Vitals:   05/11/21 0000 05/11/21 0500 05/11/21 0700 05/11/21 1237  BP: (!) 169/80 (!) 191/85 (!) 178/84 (!) 141/69  Pulse:  94  67  Resp:    16  Temp:  98.2 F (36.8 C)  98.7 F (37.1 C)  TempSrc:  Oral    SpO2:  97%  100%  Weight:  100.7 kg    Height:        Intake/Output Summary (Last 24 hours) at 05/11/2021 1303 Last data filed at 05/11/2021 0500 Gross per 24 hour  Intake 240 ml  Output 775 ml  Net -535 ml   Filed Weights   05/09/21 0840 05/10/21 1151 05/11/21 0500  Weight: 100.7 kg 100.9 kg 100.7 kg    Examination:  General exam: Appears calm and comfortable, not in any acute distress. Respiratory system: Clear to auscultation. Respiratory effort normal. Cardiovascular system: S1 & S2 heard, RRR. No JVD, murmurs, rubs, gallops or clicks.  No pedal edema. Gastrointestinal system: Abdomen is nondistended, soft and nontender. No organomegaly or masses felt. Normal bowel sounds heard. Central nervous system: Alert and oriented. No focal neurological deficits. Extremities: Symmetric 5 x 5 power.  Pitting edema+, no cyanosis, no clubbing. Skin: No rashes, lesions or ulcers Psychiatry: Judgement and insight appear normal. Mood & affect appropriate.     Data  Reviewed: I have personally reviewed following labs and imaging studies  CBC: Recent Labs  Lab 05/09/21 0904 05/10/21 0500 05/11/21 0426  WBC 7.6 8.8 9.1  HGB 8.9* 9.1* 9.5*  HCT 28.8* 30.1* 30.5*  MCV 93.8 93.8 90.8  PLT 234 224 443   Basic Metabolic Panel: Recent Labs  Lab 05/09/21 0904 05/10/21 0500 05/11/21 0426  NA 138 139 137  K 4.1 4.7 4.8  CL 106 105 105  CO2 26 27 24   GLUCOSE 140* 156* 116*  BUN 24* 29* 27*  CREATININE 1.97* 2.31* 2.32*  CALCIUM 8.6* 9.0 9.2  MG  --   --  1.9  PHOS  --   --  3.7   GFR: Estimated Creatinine Clearance: 28.8 mL/min (A) (by C-G formula based on SCr of 2.32 mg/dL (H)). Liver Function Tests: Recent Labs  Lab 05/09/21 0904  AST 11*  ALT 10  ALKPHOS 55  BILITOT 0.5  PROT 6.9  ALBUMIN 3.2*   No results for input(s): LIPASE, AMYLASE in the last 168 hours. No results for input(s): AMMONIA in the last 168 hours. Coagulation Profile: No results for input(s): INR, PROTIME in the last 168 hours. Cardiac Enzymes: No results for input(s): CKTOTAL, CKMB, CKMBINDEX, TROPONINI in the last 168 hours. BNP (last 3 results) No results for input(s): PROBNP in the last 8760 hours. HbA1C: Recent Labs    05/10/21 0500  HGBA1C 7.1*   CBG: Recent Labs  Lab 05/10/21 1135 05/10/21 1654 05/10/21 2050 05/11/21 0741 05/11/21 1135  GLUCAP 136* 144* 87 120* 98   Lipid Profile: No results for input(s): CHOL, HDL, LDLCALC, TRIG, CHOLHDL, LDLDIRECT in the last 72 hours. Thyroid Function Tests: No results for input(s): TSH, T4TOTAL, FREET4, T3FREE, THYROIDAB in the last 72 hours. Anemia Panel: No results for input(s): VITAMINB12, FOLATE, FERRITIN, TIBC, IRON, RETICCTPCT in the last 72 hours. Sepsis Labs: No results for input(s): PROCALCITON, LATICACIDVEN in the last 168 hours.  Recent Results (from the past 240 hour(s))  Resp Panel by RT-PCR (Flu A&B, Covid) Nasopharyngeal Swab     Status: None   Collection Time: 05/09/21  3:53 PM    Specimen: Nasopharyngeal Swab; Nasopharyngeal(NP) swabs in vial transport medium  Result Value Ref Range Status   SARS Coronavirus 2 by RT PCR NEGATIVE NEGATIVE Final    Comment: (NOTE) SARS-CoV-2 target nucleic acids are NOT DETECTED.  The SARS-CoV-2 RNA is generally detectable in upper respiratory specimens during the acute phase of infection. The lowest concentration of SARS-CoV-2 viral copies this assay can detect is 138 copies/mL. A negative result does not preclude SARS-Cov-2 infection and should not be used as the sole basis for treatment or other patient management decisions. A negative result may occur with  improper specimen collection/handling, submission of specimen other than nasopharyngeal swab, presence of viral mutation(s) within the areas targeted by this assay, and inadequate number of viral copies(<138 copies/mL). A negative result must be combined with clinical observations, patient history, and epidemiological information. The expected result is Negative.  Fact Sheet for Patients:  EntrepreneurPulse.com.au  Fact Sheet for Healthcare Providers:  IncredibleEmployment.be  This test is no t  yet approved or cleared by the [place] and  has been authorized for detection and/or diagnosis of SARS-CoV-2 by FDA under an Emergency Use Authorization (EUA). This EUA will remain  in effect (meaning this test can be used) for the duration of the COVID-19 declaration under Section 564(b)(1) of the Act, 21 U.S.C.section 360bbb-3(b)(1), unless the authorization is terminated  or revoked sooner.       Influenza A by PCR NEGATIVE NEGATIVE Final   Influenza B by PCR NEGATIVE NEGATIVE Final    Comment: (NOTE) The Xpert Xpress SARS-CoV-2/FLU/RSV plus assay is intended as an aid in the diagnosis of influenza from Nasopharyngeal swab specimens and should not be used as a sole basis for treatment. Nasal washings and aspirates are  unacceptable for Xpert Xpress SARS-CoV-2/FLU/RSV testing.  Fact Sheet for Patients: EntrepreneurPulse.com.au  Fact Sheet for Healthcare Providers: IncredibleEmployment.be  This test is not yet approved or cleared by the [place] FDA and has been authorized for detection and/or diagnosis of SARS-CoV-2 by FDA under an Emergency Use Authorization (EUA). This EUA will remain in effect (meaning this test can be used) for the duration of the COVID-19 declaration under Section 564(b)(1) of the Act, 21 U.S.C. section 360bbb-3(b)(1), unless the authorization is terminated or revoked.  Performed at [facility], [place] [address] [place], [place] [address]      Radiology Studies: No results found. Scheduled Meds: . amLODipine  2.5 mg Oral Daily  . aspirin EC  81 mg Oral Daily  . DULoxetine  20 mg Oral Daily  . furosemide  40 mg Intravenous BID  . heparin  5,000 Units Subcutaneous Q8H  . insulin aspart  0-20 Units Subcutaneous TID WC  . insulin aspart  4 Units Subcutaneous TID WC  . insulin glargine  15 Units Subcutaneous QHS  . metoprolol tartrate  50 mg Oral BID  . mirtazapine  15 mg Oral QHS  . pantoprazole (PROTONIX) IV  40 mg Intravenous Q24H  . risperiDONE  3 mg Oral QHS   Continuous Infusions:   LOS: 1 day    Time spent: 25 mins    Shawna Clamp, MD [facility]   If 7PM-7AM, please contact night-coverage

## 2021-05-11 NOTE — Plan of Care (Signed)
  Problem: Clinical Measurements: Goal: Respiratory complications will improve Outcome: Progressing   Problem: Activity: Goal: Risk for activity intolerance will decrease Outcome: Progressing   

## 2021-05-11 NOTE — Plan of Care (Signed)

## 2021-05-12 ENCOUNTER — Ambulatory Visit: Payer: Self-pay | Admitting: Cardiovascular Disease

## 2021-05-12 LAB — BASIC METABOLIC PANEL
Anion gap: 7 (ref 5–15)
BUN: 34 mg/dL — ABNORMAL HIGH (ref 8–23)
CO2: 25 mmol/L (ref 22–32)
Calcium: 8.9 mg/dL (ref 8.9–10.3)
Chloride: 106 mmol/L (ref 98–111)
Creatinine, Ser: 2.5 mg/dL — ABNORMAL HIGH (ref 0.44–1.00)
GFR, Estimated: 21 mL/min — ABNORMAL LOW (ref >60)
Glucose, Bld: 104 mg/dL — ABNORMAL HIGH (ref 70–99)
Potassium: 4.6 mmol/L (ref 3.5–5.1)
Sodium: 138 mmol/L (ref 135–145)

## 2021-05-12 LAB — CBC
HCT: 28 % — ABNORMAL LOW (ref 36.0–46.0)
Hemoglobin: 8.5 g/dL — ABNORMAL LOW (ref 12.0–15.0)
MCH: 28.5 pg (ref 26.0–34.0)
MCHC: 30.4 g/dL (ref 30.0–36.0)
MCV: 94 fL (ref 80.0–100.0)
Platelets: 232 10*3/uL (ref 150–400)
RBC: 2.98 MIL/uL — ABNORMAL LOW (ref 3.87–5.11)
RDW: 13.9 % (ref 11.5–15.5)
WBC: 7.4 10*3/uL (ref 4.0–10.5)
nRBC: 0 % (ref 0.0–0.2)

## 2021-05-12 LAB — GLUCOSE, CAPILLARY
Glucose-Capillary: 99 mg/dL (ref 70–99)
Glucose-Capillary: 113 mg/dL — ABNORMAL HIGH (ref 70–99)

## 2021-05-12 LAB — BRAIN NATRIURETIC PEPTIDE: B Natriuretic Peptide: 281.2 pg/mL — ABNORMAL HIGH (ref 0.0–100.0)

## 2021-05-12 MED ORDER — FUROSEMIDE 10 MG/ML IJ SOLN: 20.0000 mg | Freq: Two times a day (BID) | INTRAMUSCULAR | Status: DC

## 2021-05-12 NOTE — Evaluation (Signed)
Physical Therapy Evaluation Patient Details Name: Nancy [name] MRN: [id number] DOB: 12/06/1961 Today's Date: 05/12/2021   History of Present Illness  This 60 years old female with PMH significant for chronic diastolic CHF, CKD stage IIIb, right carotid artery stenosis, hypertension, hyperlipidemia, pulmonary hypertension, type 2 diabetes with peripheral neuropathy, CVA presented in the ED with bilateral lower extremity swelling,Patient is admitted for suspected acute on chronic CHF exacerbation due to dietary noncompliance  Clinical Impression  The patient ambulated x 120' using SPC, gait is guarded but steady for hosehold distances. Patient did not complain of any dizziness. Patient ambulated in room then into hallway. No further PT needs. Has assistance at home by family. Patient plans to DC today hopefully.     Follow Up Recommendations No PT follow up    Equipment Recommendations  None recommended by PT    Recommendations for Other Services       Precautions / Restrictions Precautions Precautions: Fall      Mobility  Bed Mobility               General bed mobility comments: in recliner    Transfers Overall transfer level: Modified independent                  Ambulation/Gait Ambulation/Gait assistance: Supervision Gait Distance (Feet): 120 Feet Assistive device: Straight cane Gait Pattern/deviations: Step-through pattern   Gait velocity interpretation: <1.31 ft/sec, indicative of household ambulator General Gait Details: mildly guarded  Stairs            Wheelchair Mobility    Modified Rankin (Stroke Patients Only)       Balance Overall balance assessment: Needs assistance Sitting-balance support: No upper extremity supported;Feet supported Sitting balance-Leahy Scale: Good     Standing balance support: During functional activity;Single extremity supported Standing balance-Leahy Scale: Fair                                Pertinent Vitals/Pain Pain Assessment: No/denies pain    Home Living Family/patient expects to be discharged to:: Private residence Living Arrangements: Spouse/significant other Available Help at Discharge: Family;Available 24 hours/day Type of Home: Apartment Home Access: Stairs to enter   Entrance Stairs-Number of Steps: threshhold Home Layout: One level Home Equipment: Walker - 2 wheels;Cane - single point;Bedside commode      Prior Function Level of Independence: Needs assistance   Gait / Transfers Assistance Needed: household ambulator with cane mostly  ADL's / Homemaking Assistance Needed: spouse cooks, cleans, has assist for ADL's        Hand Dominance   Dominant Hand: Right    Extremity/Trunk Assessment   Upper Extremity Assessment Upper Extremity Assessment: Overall WFL for tasks assessed    Lower Extremity Assessment Lower Extremity Assessment: Generalized weakness    Cervical / Trunk Assessment Cervical / Trunk Assessment: Normal  Communication   Communication: No difficulties  Cognition Arousal/Alertness: Awake/alert Behavior During Therapy: WFL for tasks assessed/performed Overall Cognitive Status: Within Functional Limits for tasks assessed                                        General Comments      Exercises     Assessment/Plan    PT Assessment Patent does not need any further PT services  PT Problem List  PT Treatment Interventions      PT Goals (Current goals can be found in the Care Plan section)  Acute Rehab PT Goals Patient Stated Goal: go home PT Goal Formulation: All assessment and education complete, DC therapy    Frequency     Barriers to discharge        Co-evaluation               AM-PAC PT "6 Clicks" Mobility  Outcome Measure Help needed turning from your back to your side while in a flat bed without using bedrails?: None Help needed moving from lying on your back to sitting  on the side of a flat bed without using bedrails?: None Help needed moving to and from a bed to a chair (including a wheelchair)?: A Little Help needed standing up from a chair using your arms (e.g., wheelchair or bedside chair)?: A Little Help needed to walk in hospital room?: A Little Help needed climbing 3-5 steps with a railing? : A Little 6 Click Score: 20    End of Session Equipment Utilized During Treatment: Gait belt Activity Tolerance: Patient tolerated treatment well Patient left: in chair;with call bell/phone within reach Nurse Communication: Mobility status PT Visit Diagnosis: Unsteadiness on feet (R26.81)    Time: 1010-1029 PT Time Calculation (min) (ACUTE ONLY): 19 min   Charges:   PT Evaluation $PT Eval Low Complexity: Nora Pager [phone] Office [phone]   Claretha Cooper 05/12/2021, 10:40 AM

## 2021-05-12 NOTE — Progress Notes (Signed)
Pt to be discharged to home this afternoon. Pt given discharge teaching including review of all discharge Medications and schedules for these Medications. Pt verbalized understanding of all discharge teaching. Discharge AVS with pt at time of discharge

## 2021-05-12 NOTE — Discharge Summary (Signed)
Physician Discharge Summary  Nancy [name] PQZ:[id number] DOB: 09-Mar-1961 DOA: 05/09/2021  PCP: [name], MD  Admit date: 05/09/2021 .  Discharge date: 05/12/2021  Admitted From: Home.  Disposition:  Home.  Recommendations for Outpatient Follow-up:  1. Follow up with PCP in 1-2 weeks 2. Please obtain BMP/CBC in one week 3. Advised to follow-up with Nephrology as scheduled. 4. Appointment has been made with [facility] ( Dr. Joylene Grapes )  [place]: None. Equipment/Devices: None.  Discharge Condition: Stable CODE STATUS:Full code Diet recommendation: Renal diet  Brief Summary/ Hospital Course: This 60 years old female with PMH significant for chronic diastolic CHF, CKD stage IIIb, right carotid artery stenosis, hypertension, hyperlipidemia, pulmonary hypertension, type 2 diabetes with peripheral neuropathy, CVA presented in the ED with bilateral lower extremity swelling for 2 weeks which has been worsening over the past few days. Patient also reported nonproductive cough for several days.  Patient reports orthopnea, weight gain despite taking her scheduled Lasix dosing twice daily.   Patient was admitted for suspected acute on chronic CHF exacerbation due to dietary noncompliance.  She was also found to have nausea and vomiting which has resolved.  Patient was started on IV Lasix therapy.  Recent echocardiogram shows LVEF 60 to 33%, grade 2 diastolic dysfunction. Patient's BNP was 810 on admission, troponins x 2 negative, Patient had good urine output.  Patient seems much improved,  ambulated well without any shortness of breath.  Leg swelling has significantly improved.  Patient serum creatinine has slightly increased from her baseline.  Patient has not seen nephrology in the past.  Appointment has been made with Dr. Joylene Grapes.  Patient feels strong and wants to be discharged.  She was managed for below problems.  Discharge Diagnoses:  Principal Problem:   Acute CHF  (congestive heart failure) (HCC) Active Problems:   Nausea & vomiting   Diabetic gastroparesis associated with type 2 diabetes mellitus ([place])   Hypertension   Hyperlipidemia associated with type 2 diabetes mellitus ([place])  Suspected acute on chronic diastolic CHF exacerbation likely due to dietary noncompliance. Patient presented with orthopnea, weight gain, bilateral pedal edema, nonproductive cough. 04/05/2021 Echo showed EF 60 to 65%,  grade 2 diastolic dysfunction. BNP 800+, troponin negative. Continue gentle IV diuresis with lasix, Monitor intake output charting.  Daily weight. 1.5 L negative balance.  Nausea and vomiting: > Resolved. Could be multifactorial,  viral gastritis versus diabetic gastroparesis. Continue clear liquid diet,  continue Protonix,  Zofran as needed. Reglan as needed.  Hypertension: Continue home medications,  Hydralazine as needed  Diabetes: Continue sliding scale. Continue home Lantus.  AKI on CKD stage IIIb Avoid nephrotoxic medications.   Recheck labs tomorrow morning. Consider nephro consult if creatinine continues to trend up.   Discharge Instructions  Discharge Instructions    Call MD for:  difficulty breathing, headache or visual disturbances   Complete by: As directed    Call MD for:  persistant dizziness or light-headedness   Complete by: As directed    Call MD for:  persistant nausea and vomiting   Complete by: As directed    Diet - low sodium heart healthy   Complete by: As directed    Diet Carb Modified   Complete by: As directed    Discharge instructions   Complete by: As directed    Advised to follow-up with primary care physician in 1 week. Advised to follow-up with nephrology . Appointment has been made with [facility].   Increase activity slowly  Complete by: As directed      Allergies as of 05/12/2021      Reactions   Elemental Sulfur Hives, Other (See Comments)   PATIENT STATED THIS, MORE THAN LIKELY,  SHOULD HAVE BEEN LOGGED AS "SULFA"   Hydralazine Hcl Other (See Comments)   Hair loss   Hydrocodone Itching, Other (See Comments)   Upset stomach   Metformin And Related Nausea And Vomiting, Other (See Comments)   Stomach pains, also   Other Nausea Only, Other (See Comments)   Lettuce- Does not digest this!!   Plaquenil [hydroxychloroquine Sulfate] Hives   Shellfish-derived Products Nausea Only, Other (See Comments)   Caused an upset stomach   Shrimp (diagnostic) Nausea Only, Other (See Comments)   Upset stomach    Sulfa Antibiotics Hives      Medication List    TAKE these medications   acetaminophen 500 MG tablet Commonly known as: TYLENOL Take 500 mg by mouth every 6 (six) hours as needed for moderate pain or headache.   albuterol 108 (90 Base) MCG/ACT inhaler Commonly known as: VENTOLIN HFA Inhale 1-2 puffs into the lungs every 6 (six) hours as needed for wheezing or shortness of breath.   amLODipine 2.5 MG tablet Commonly known as: NORVASC Take 1 tablet (2.5 mg total) by mouth daily. Notes to patient: Last Dose of this Medication was given on May 12, 2021 at 10:05 am   aspirin EC 81 MG tablet Take 1 tablet (81 mg total) by mouth daily. Notes to patient: Last Dose of this Medication was given on 05/12/2021 at 10:05 am   DULoxetine 20 MG capsule Commonly known as: CYMBALTA TAKE 1 CAPSULE (20 MG TOTAL) BY MOUTH DAILY. What changed: how much to take Notes to patient: Last Dose of this Medication was given on May 12, 2021 at 10:06 am   furosemide 20 MG tablet Commonly known as: LASIX Take 2 tablets (40 mg total) by mouth daily. What changed:   how much to take  when to take this   glucose blood test strip Use as instructedCheck blood sugar three times daily. E11.40   insulin glargine 100 UNIT/ML Solostar Pen Commonly known as: LANTUS Inject 58 Units into the skin at bedtime. What changed: how much to take   insulin lispro 100 UNIT/ML KwikPen Commonly known  as: HumaLOG KwikPen Inject 4 units before breakfast and dinner.   metoprolol tartrate 50 MG tablet Commonly known as: LOPRESSOR Take 1 tablet (50 mg total) by mouth 2 (two) times daily. Notes to patient: Last Dose of this Medication was given on May 12, 2021 at 10:06 am.  Please take a second dose of this Medication tonight   mirtazapine 15 MG tablet Commonly known as: REMERON TAKE 1 TABLET (15 MG TOTAL) BY MOUTH AT BEDTIME. What changed: how much to take   OneTouch Verio w/Device Kit Check blood sugar three times daily. E11.40   True Metrix Meter w/Device Kit Check blood sugar three times daily. E11.40   risperiDONE 3 MG tablet Commonly known as: RISPERDAL TAKE 1 TABLET (3 MG TOTAL) BY MOUTH AT BEDTIME. What changed:   how much to take  how to take this  when to take this       Follow-up Information    [name], MD Follow up in 1 week(s).   Specialty: Internal Medicine Contact information: [place] [address] [phone]        Reesa Chew, MD Follow up in 1 week(s).  Specialty: Internal Medicine Contact information: [address] [place] [address] [phone]              Allergies  Allergen Reactions  . Elemental Sulfur Hives and Other (See Comments)    PATIENT STATED THIS, MORE THAN LIKELY, SHOULD HAVE BEEN LOGGED AS "SULFA"  . Hydralazine Hcl Other (See Comments)    Hair loss  . Hydrocodone Itching and Other (See Comments)    Upset stomach  . Metformin And Related Nausea And Vomiting and Other (See Comments)    Stomach pains, also  . Other Nausea Only and Other (See Comments)    Lettuce- Does not digest this!!  . Plaquenil [Hydroxychloroquine Sulfate] Hives  . Shellfish-Derived Products Nausea Only and Other (See Comments)    Caused an upset stomach  . Shrimp (Diagnostic) Nausea Only and Other (See Comments)    Upset stomach   . Sulfa Antibiotics Hives     Consultations:  None   Procedures/Studies: DG Chest 2 View  Result Date: 05/09/2021 CLINICAL DATA:  Cough and short of breath EXAM: CHEST - 2 VIEW COMPARISON:  04/20/2020 FINDINGS: Heart size upper normal. There are streaky perihilar lung markings bilaterally which have developed in the interval. This is symmetric. There are small pleural effusions bilaterally. Mild left lower lobe atelectasis. IMPRESSION: Streaky lung markings bilaterally which could be due to interstitial edema or infection. Small bilateral effusions. Mild left lower lobe atelectasis. Electronically Signed   By: Franchot Gallo M.D.   On: 05/09/2021 10:16   CARDIAC CATHETERIZATION  Result Date: 04/27/2021  Ost LAD to Prox LAD lesion is 40% stenosed.  Mid LAD lesion is 50% stenosed.  Mid LAD to Dist LAD lesion is 40% stenosed.  Hemodynamic findings consistent with pulmonary hypertension.  Nancy [name] is a 60 y.o. female  [id number] LOCATION:  FACILITY: [facility] PHYSICIAN: Nancy [name], M.D. Jun 18, 1961 DATE OF PROCEDURE:  04/27/2021 DATE OF DISCHARGE: CARDIAC CATHETERIZATION History obtained from chart review. Nancy [name] is a 60 y.o.  moderate to severely overweight married African-American female mother of 1 child with no grandchildren who does not work and was referred by her primary care provider,Nancy Mayers PA-Cfor evaluation of dyspnea on exertion and orthopnea.  She is accompanied by [name] today.  I last saw her in the office 03/07/2021.She does have a brief history of tobacco abuse having begun smoking at age 33 and stopped 4 months ago. She has treated hypertension and diabetes and untreated hyperlipidemia. She does have a family history of heart disease with a mother that had myocardial infarction in brother who has had stents. She apparently had asymptomatic strokes found on CT but has never been clinically diagnosed with 1. She is never had a heart attack. She denies chest pain but does  admit to dyspnea on exertion has been getting progressively worse as well as bilateral lower extremity edema for which she was prescribed furosemide. She also still describes symptoms compatible with orthopnea. She had blood work performed by her PCP that showed elevated BNP in the 300 range.   I did get a 2D echocardiogram on her 04/05/2021 that showed normal LV systolic function, grade 2 diastolic dysfunction and no valvular abnormalities.  I did increase her furosemide to 20 mg a day which did not substantively change her renal function.  Last creatinine was 1.76.  Carotid Dopplers performed 03/10/2021 showed an occluded right internal carotid artery with moderate left ICA stenosis.  Her Kim[place] did suggest anterior ischemia versus breast  attenuation.  She presents today for right left heart cath to define her anatomy and physiology.   Ms. Browder has pulmonary hypertension with high LVEDP and right atrial pressure as well as wedge.  She has mild to moderate segmental proximal mid and distal LAD disease with out a focal culprit lesion.  Is hard to tell whether her Myoview was truly positive or real represented breast attenuation artifact but in any event there is nothing that requires revascularization.  Her circumflex and right coronary artery were free of significant disease.  She will need medical therapy for her pulmonary hypertension including diuresis and vasodilators.  The radial sheath was removed and a TR band was placed on the right wrist to achieve patent hemostasis.  The patient left the lab in stable condition.  She will be discharged home today as an outpatient and will follow up with me next week.  I will arrange for her to see one of the advanced heart failure MDs and the heart failure clinic.  She left the lab in stable condition. Nancy [name]. MD, [facility] 04/27/2021 8:34 AM   MYOCARDIAL PERFUSION IMAGING  Result Date: 04/21/2021  Lexiscan stress is electricallly negative for  ischemia  Myoview scan shows smal basal anterior defect, base/mid anterolateral defect and basal inferior defect that partially improves consistent with scar and ischemia,; cannot exclude coexistent soft tissue attenuation (breast, shifting breast)  LVEF calculated at 46% with lateral hypokinesis  OVerall intermediate risk scan  Note: patient scheduled for L heart catheterization to define anatomy       Subjective: Patient was seen and examined at bedside.  Overnight events noted.  Patient reports feeling much improved.   Patient has ambulated in the hallway , reports feeling better.  Patient wants to be discharged.  Discharge Exam: Vitals:   05/12/21 0522 05/12/21 1005  BP: 139/87 135/72  Pulse: 70 74  Resp: 14   Temp: 98.5 F (36.9 C)   SpO2: 99%    Vitals:   05/11/21 1237 05/11/21 2214 05/12/21 0522 05/12/21 1005  BP: (!) 141/69 (!) 168/85 139/87 135/72  Pulse: 67 80 70 74  Resp: _0 Temp: 98.7 F (37.1 C) 98.3 F (36.8 C) 98.5 F (36.9 C)   TempSrc:  Oral Oral   SpO2: 100% 100% 99%   Weight:   100.8 kg   Height:        General: Pt is alert, awake, not in acute distress Cardiovascular: RRR, S1/S2 +, no rubs, no gallops Respiratory: CTA bilaterally, no wheezing, no rhonchi Abdominal: Soft, NT, ND, bowel sounds + Extremities: no edema, no cyanosis    The results of significant diagnostics from this hospitalization (including imaging, microbiology, ancillary and laboratory) are listed below for reference.     Microbiology: Recent Results (from the past 240 hour(s))  Resp Panel by RT-PCR (Flu A&B, Covid) Nasopharyngeal Swab     Status: None   Collection Time: 05/09/21  3:53 PM   Specimen: Nasopharyngeal Swab; Nasopharyngeal(NP) swabs in vial transport medium  Result Value Ref Range Status   SARS Coronavirus 2 by RT PCR NEGATIVE NEGATIVE Final    Comment: (NOTE) SARS-CoV-2 target nucleic acids are NOT DETECTED.  The SARS-CoV-2 RNA is generally  detectable in upper respiratory specimens during the acute phase of infection. The lowest concentration of SARS-CoV-2 viral copies this assay can detect is 138 copies/mL. A negative result does not preclude SARS-Cov-2 infection and should not be used as the sole basis for treatment or other  patient management decisions. A negative result may occur with  improper specimen collection/handling, submission of specimen other than nasopharyngeal swab, presence of viral mutation(s) within the areas targeted by this assay, and inadequate number of viral copies(<138 copies/mL). A negative result must be combined with clinical observations, patient history, and epidemiological information. The expected result is Negative.  Fact Sheet for Patients:  EntrepreneurPulse.com.au  Fact Sheet for Healthcare Providers:  IncredibleEmployment.be  This test is no t yet approved or cleared by the [place] FDA and  has been authorized for detection and/or diagnosis of SARS-CoV-2 by FDA under an Emergency Use Authorization (EUA). This EUA will remain  in effect (meaning this test can be used) for the duration of the COVID-19 declaration under Section 564(b)(1) of the Act, 21 U.S.C.section 360bbb-3(b)(1), unless the authorization is terminated  or revoked sooner.       Influenza A by PCR NEGATIVE NEGATIVE Final   Influenza B by PCR NEGATIVE NEGATIVE Final    Comment: (NOTE) The Xpert Xpress SARS-CoV-2/FLU/RSV plus assay is intended as an aid in the diagnosis of influenza from Nasopharyngeal swab specimens and should not be used as a sole basis for treatment. Nasal washings and aspirates are unacceptable for Xpert Xpress SARS-CoV-2/FLU/RSV testing.  Fact Sheet for Patients: EntrepreneurPulse.com.au  Fact Sheet for Healthcare Providers: IncredibleEmployment.be  This test is not yet approved or cleared by the [place] FDA  and has been authorized for detection and/or diagnosis of SARS-CoV-2 by FDA under an Emergency Use Authorization (EUA). This EUA will remain in effect (meaning this test can be used) for the duration of the COVID-19 declaration under Section 564(b)(1) of the Act, 21 U.S.C. section 360bbb-3(b)(1), unless the authorization is terminated or revoked.  Performed at [facility], [place] [address] [place], [place] [address]      Labs: BNP (last 3 results) Recent Labs    03/01/21 1130 05/09/21 0904 05/12/21 0512  BNP 238.8* 895.0* 378.5*   Basic Metabolic Panel: Recent Labs  Lab 05/09/21 0904 05/10/21 0500 05/11/21 0426 05/12/21 0512  NA 138 139 137 138  K 4.1 4.7 4.8 4.6  CL 106 105 105 106  CO2 _0 GLUCOSE 140* 156* 116* 104*  BUN 24* 29* 27* 34*  CREATININE 1.97* 2.31* 2.32* 2.50*  CALCIUM 8.6* 9.0 9.2 8.9  MG  --   --  1.9  --   PHOS  --   --  3.7  --    Liver Function Tests: Recent Labs  Lab 05/09/21 0904  AST 11*  ALT 10  ALKPHOS 55  BILITOT 0.5  PROT 6.9  ALBUMIN 3.2*   No results for input(s): LIPASE, AMYLASE in the last 168 hours. No results for input(s): AMMONIA in the last 168 hours. CBC: Recent Labs  Lab 05/09/21 0904 05/10/21 0500 05/11/21 0426 05/12/21 0512  WBC 7.6 8.8 9.1 7.4  HGB 8.9* 9.1* 9.5* 8.5*  HCT 28.8* 30.1* 30.5* 28.0*  MCV 93.8 93.8 90.8 94.0  PLT 234 224 167 232   Cardiac Enzymes: No results for input(s): CKTOTAL, CKMB, CKMBINDEX, TROPONINI in the last 168 hours. BNP: Invalid input(s): POCBNP CBG: Recent Labs  Lab 05/11/21 1135 05/11/21 1655 05/11/21 2201 05/12/21 0708 05/12/21 1157  GLUCAP 98 141* 140* 99 113*   D-Dimer No results for input(s): DDIMER in the last 72 hours. Hgb A1c Recent Labs    05/10/21 0500  HGBA1C 7.1*   Lipid Profile No results for input(s): CHOL, HDL, LDLCALC, TRIG, CHOLHDL,  LDLDIRECT in the last 72 hours. Thyroid function studies No results for input(s):  TSH, T4TOTAL, T3FREE, THYROIDAB in the last 72 hours.  Invalid input(s): FREET3 Anemia work up No results for input(s): VITAMINB12, FOLATE, FERRITIN, TIBC, IRON, RETICCTPCT in the last 72 hours. Urinalysis    Component Value Date/Time   COLORURINE YELLOW 04/20/2020 2008   APPEARANCEUR CLOUDY (A) 04/20/2020 2008   LABSPEC 1.009 04/20/2020 2008   PHURINE 5.0 04/20/2020 2008   GLUCOSEU NEGATIVE 04/20/2020 2008   HGBUR NEGATIVE 04/20/2020 2008   BILIRUBINUR NEGATIVE 04/20/2020 2008   BILIRUBINUR negative 12/26/2017 [place] 04/20/2020 2008   PROTEINUR NEGATIVE 04/20/2020 2008   UROBILINOGEN 0.2 12/26/2017 1458   UROBILINOGEN 0.2 09/07/2014 0857   NITRITE NEGATIVE 04/20/2020 2008   LEUKOCYTESUR NEGATIVE 04/20/2020 2008   Sepsis Labs Invalid input(s): PROCALCITONIN,  WBC,  LACTICIDVEN Microbiology Recent Results (from the past 240 hour(s))  Resp Panel by RT-PCR (Flu A&B, Covid) Nasopharyngeal Swab     Status: None   Collection Time: 05/09/21  3:53 PM   Specimen: Nasopharyngeal Swab; Nasopharyngeal(NP) swabs in vial transport medium  Result Value Ref Range Status   SARS Coronavirus 2 by RT PCR NEGATIVE NEGATIVE Final    Comment: (NOTE) SARS-CoV-2 target nucleic acids are NOT DETECTED.  The SARS-CoV-2 RNA is generally detectable in upper respiratory specimens during the acute phase of infection. The lowest concentration of SARS-CoV-2 viral copies this assay can detect is 138 copies/mL. A negative result does not preclude SARS-Cov-2 infection and should not be used as the sole basis for treatment or other patient management decisions. A negative result may occur with  improper specimen collection/handling, submission of specimen other than nasopharyngeal swab, presence of viral mutation(s) within the areas targeted by this assay, and inadequate number of viral copies(<138 copies/mL). A negative result must be combined with clinical observations, patient history,  and epidemiological information. The expected result is Negative.  Fact Sheet for Patients:  EntrepreneurPulse.com.au  Fact Sheet for Healthcare Providers:  IncredibleEmployment.be  This test is no t yet approved or cleared by the [place] FDA and  has been authorized for detection and/or diagnosis of SARS-CoV-2 by FDA under an Emergency Use Authorization (EUA). This EUA will remain  in effect (meaning this test can be used) for the duration of the COVID-19 declaration under Section 564(b)(1) of the Act, 21 U.S.C.section 360bbb-3(b)(1), unless the authorization is terminated  or revoked sooner.       Influenza A by PCR NEGATIVE NEGATIVE Final   Influenza B by PCR NEGATIVE NEGATIVE Final    Comment: (NOTE) The Xpert Xpress SARS-CoV-2/FLU/RSV plus assay is intended as an aid in the diagnosis of influenza from Nasopharyngeal swab specimens and should not be used as a sole basis for treatment. Nasal washings and aspirates are unacceptable for Xpert Xpress SARS-CoV-2/FLU/RSV testing.  Fact Sheet for Patients: EntrepreneurPulse.com.au  Fact Sheet for Healthcare Providers: IncredibleEmployment.be  This test is not yet approved or cleared by the [place] FDA and has been authorized for detection and/or diagnosis of SARS-CoV-2 by FDA under an Emergency Use Authorization (EUA). This EUA will remain in effect (meaning this test can be used) for the duration of the COVID-19 declaration under Section 564(b)(1) of the Act, 21 U.S.C. section 360bbb-3(b)(1), unless the authorization is terminated or revoked.  Performed at [facility], [place] [address] [place], [place] [address]      Time coordinating discharge: Over 30 minutes  SIGNED:   Shawna Clamp, MD  [facility] 05/12/2021, 12:52 PM Pager   If 7PM-7AM, please contact night-coverage [email or web address]

## 2021-05-12 NOTE — Evaluation (Signed)
Occupational Therapy Evaluation Patient Details Name: Nancy [name] MRN: [id number] DOB: May 11, 1961 Today's Date: 05/12/2021    History of Present Illness This 60 years old female with PMH significant for chronic diastolic CHF, CKD stage IIIb, right carotid artery stenosis, hypertension, hyperlipidemia, pulmonary hypertension, type 2 diabetes with peripheral neuropathy, CVA presented in the ED with bilateral lower extremity swelling,Patient is admitted for suspected acute on chronic CHF exacerbation due to dietary noncompliance   Clinical Impression   Nancy [name] is a 60 year old woman who presents with above medical history. On evaluation patient presents near her baseline in regards to functional abilities - needing assistance for ADLs (needs assistance for lower body dressing and assistance for tub transfer and bathing) and patient's spouse performing cooking and cleaning. However patient unsteady and needs min guard for all standing during ADLs and ambulation. She had three anterior loss of balances with therapist -requiring min assist from therapist to steady patient - twice with dressing and once towards ambulation to the chair. Patient reports she has a walker at home and therapist recommended use of walker for safety. Therapist also recommends initial 24/7 assistance - patient reports her husband goes to work at 2:oo pm. Therapist recommended patient have family come stay with her. From a functional standpoint - patient near her baseline but unsteady. Will f/u acutely to work on balance and safety while in hospital.    Follow Up Recommendations  Supervision/Assistance - 24 hour;No OT follow up    Equipment Recommendations  None recommended by OT    Recommendations for Other Services       Precautions / Restrictions Precautions Precautions: Fall Restrictions Weight Bearing Restrictions: No      Mobility Bed Mobility               General bed mobility  comments: siiting at edge of bed    Transfers Overall transfer level: Needs assistance Equipment used: None             General transfer comment: with cane or hand hold assistance patient ambulated in room. Two anterior LOBs with standing to pull up clothing - min assist. Another anterior loss of balance going towards recliner - patient did not use cane.    Balance Overall balance assessment: Needs assistance Sitting-balance support: No upper extremity supported;Feet supported Sitting balance-Leahy Scale: Good     Standing balance support: Single extremity supported Standing balance-Leahy Scale: Fair Standing balance comment: Able to stand - unable to tolerate challange resulting in anterior loss of balances                           ADL either performed or assessed with clinical judgement   ADL Overall ADL's : Needs assistance/impaired Eating/Feeding: Independent   Grooming: Min guard;Standing   Upper Body Bathing: Supervision/ safety   Lower Body Bathing: Min guard;Sit to/from stand   Upper Body Dressing : Supervision/safety   Lower Body Dressing: Moderate assistance;Sit to/from stand Lower Body Dressing Details (indicate cue type and reason): assistance to don clothing over feet and don shoes and min assist to steady with standing. Two LOBs with pulling clothing up. Toilet Transfer: Electronics engineer and Hygiene: Min guard;Sit to/from stand               Vision Patient Visual Report: No change from baseline       Perception     Praxis  Pertinent Vitals/Pain Pain Assessment: No/denies pain     Hand Dominance Right   Extremity/Trunk Assessment Upper Extremity Assessment Upper Extremity Assessment: Overall WFL for tasks assessed   Lower Extremity Assessment Lower Extremity Assessment: Defer to PT evaluation   Cervical / Trunk Assessment Cervical / Trunk Assessment: Normal    Communication Communication Communication: No difficulties   Cognition Arousal/Alertness: Awake/alert Behavior During Therapy: WFL for tasks assessed/performed Overall Cognitive Status: Within Functional Limits for tasks assessed                                 General Comments: Questionable insight into deficits and concerns for safety.   General Comments       Exercises     Shoulder Instructions      Home Living Family/patient expects to be discharged to:: Private residence Living Arrangements: Spouse/significant other Available Help at Discharge: Family;Available 24 hours/day Type of Home: Apartment Home Access: Stairs to enter Entrance Stairs-Number of Steps: threshhold   Home Layout: One level     Bathroom Shower/Tub: Teacher, early years/pre: Handicapped height     Home Equipment: Environmental consultant - 2 wheels;Cane - single point;Bedside commode          Prior Functioning/Environment Level of Independence: Needs assistance  Gait / Transfers Assistance Needed: household ambulator with cane mostly ADL's / Homemaking Assistance Needed: spouse cooks, cleans, has assist for ADL's            OT Problem List: Impaired balance (sitting and/or standing)      OT Treatment/Interventions: Self-care/ADL training;Balance training;Patient/family education;DME and/or AE instruction    OT Goals(Current goals can be found in the care plan section) Acute Rehab OT Goals Patient Stated Goal: improve balance and safety OT Goal Formulation: With patient Time For Goal Achievement: 05/26/21 Potential to Achieve Goals: Good  OT Frequency: Min 2X/week   Barriers to D/C:            Co-evaluation              AM-PAC OT "6 Clicks" Daily Activity     Outcome Measure Help from another person eating meals?: None Help from another person taking care of personal grooming?: A Little Help from another person toileting, which includes using toliet, bedpan, or  urinal?: A Little Help from another person bathing (including washing, rinsing, drying)?: A Little Help from another person to put on and taking off regular upper body clothing?: A Little Help from another person to put on and taking off regular lower body clothing?: A Lot 6 Click Score: 18   End of Session Nurse Communication: Mobility status  Activity Tolerance: Patient tolerated treatment well Patient left: in chair;with call bell/phone within reach  OT Visit Diagnosis: Unsteadiness on feet (R26.81)                Time: 8325-4982 OT Time Calculation (min): 10 min Charges:  OT General Charges $OT Visit: 1 Visit OT Evaluation $OT Eval Low Complexity: 1 Low  Brynnlee Cumpian, OTR/L [place]  Office [phone] Pager: [place] 05/12/2021, 12:07 PM

## 2021-05-12 NOTE — Plan of Care (Signed)
  Problem: Clinical Measurements: Goal: Respiratory complications will improve Outcome: Progressing Goal: Cardiovascular complication will be avoided Outcome: Progressing   Problem: Activity: Goal: Risk for activity intolerance will decrease Outcome: Progressing   Problem: Nutrition: Goal: Adequate nutrition will be maintained Outcome: Progressing   Problem: Coping: Goal: Level of anxiety will decrease Outcome: Progressing   Problem: Elimination: Goal: Will not experience complications related to bowel motility Outcome: Progressing Goal: Will not experience complications related to urinary retention Outcome: Progressing   Problem: Pain Managment: Goal: General experience of comfort will improve Outcome: Progressing   Problem: Safety: Goal: Ability to remain free from injury will improve Outcome: Progressing   Problem: Skin Integrity: Goal: Risk for impaired skin integrity will decrease Outcome: Progressing   

## 2021-05-12 NOTE — Discharge Instructions (Signed)
Advised to follow-up with primary care physician in 1 week. Advised to follow-up with nephrology . Appointment has been made with [facility].

## 2021-05-16 ENCOUNTER — Telehealth: Payer: Self-pay

## 2021-05-16 NOTE — Telephone Encounter (Signed)
Transition Care Management Follow-up Telephone Call  Date of discharge and from where: 05/12/2021; [facility]   How have you been since you were released from the hospital? She said she is doing okay  Any questions or concerns? No  Items Reviewed:  Did the pt receive and understand the discharge instructions provided? Yes   Medications obtained and verified? Yes  - she said she has all medications and did not have any questions about her med regime.  Other? No   Any new allergies since your discharge? No   Do you have support at home? Yes , her husband  [place] and Equipment/Supplies: Were home health services ordered? no If so, what is the name of the agency? n/a  Has the agency set up a time to come to the patient's home? not applicable Were any new equipment or medical supplies ordered?  No What is the name of the medical supply agency? n/a Were you able to get the supplies/equipment? not applicable Do you have any questions related to the use of the equipment or supplies? No   She said that she is going to get a scale on Friday 05/19/2021 and she will also get a glucometer.   Functional Questionnaire: (I = Independent and D = Dependent) ADLs: independent, has cane to use with ambulation.   Follow up appointments reviewed:   PCP Hospital f/u appt confirmed? No  - she did not want to schedule an appointment at this time.  She said she would call the clinic when ready.  Explained to her that she could be seen at another clinic if Dr Wynetta Emery does not have any appointments available.   [facility] f/u appt confirmed? Yes  - [facility] - 05/23/2021; cardiology - 07/11/2021.    Are transportation arrangements needed? No   If their condition worsens, is the pt aware to call PCP or go to the Emergency Dept.? Yes  Was the patient provided with contact information for the PCP's office or ED? Yes  Was to pt encouraged to call back with questions or concerns?  Yes

## 2021-05-24 ENCOUNTER — Telehealth (HOSPITAL_COMMUNITY): Payer: No Payment, Other | Admitting: Psychiatry

## 2021-05-26 ENCOUNTER — Ambulatory Visit: Payer: Self-pay | Admitting: Physician Assistant

## 2021-05-28 ENCOUNTER — Emergency Department (HOSPITAL_COMMUNITY)
Admission: EM | Admit: 2021-05-28 | Discharge: 2021-05-28 | Disposition: A | Discharge status: Home / Self Care | Payer: Medicaid Other | Attending: Emergency Medicine | Admitting: Emergency Medicine

## 2021-05-28 ENCOUNTER — Emergency Department (HOSPITAL_COMMUNITY): Payer: Medicaid Other

## 2021-05-28 ENCOUNTER — Encounter (HOSPITAL_COMMUNITY): Payer: Self-pay | Admitting: Emergency Medicine

## 2021-05-28 ENCOUNTER — Other Ambulatory Visit: Payer: Self-pay

## 2021-05-28 DIAGNOSIS — E1140 Type 2 diabetes mellitus with diabetic neuropathy, unspecified: Secondary | ICD-10-CM | POA: Diagnosis not present

## 2021-05-28 DIAGNOSIS — I509 Heart failure, unspecified: Secondary | ICD-10-CM | POA: Insufficient documentation

## 2021-05-28 DIAGNOSIS — Z7982 Long term (current) use of aspirin: Secondary | ICD-10-CM | POA: Insufficient documentation

## 2021-05-28 DIAGNOSIS — Z794 Long term (current) use of insulin: Secondary | ICD-10-CM | POA: Insufficient documentation

## 2021-05-28 DIAGNOSIS — E1169 Type 2 diabetes mellitus with other specified complication: Secondary | ICD-10-CM | POA: Diagnosis not present

## 2021-05-28 DIAGNOSIS — I110 Hypertensive heart disease with heart failure: Secondary | ICD-10-CM | POA: Diagnosis not present

## 2021-05-28 DIAGNOSIS — E785 Hyperlipidemia, unspecified: Secondary | ICD-10-CM | POA: Insufficient documentation

## 2021-05-28 DIAGNOSIS — Z79899 Other long term (current) drug therapy: Secondary | ICD-10-CM | POA: Insufficient documentation

## 2021-05-28 DIAGNOSIS — R609 Edema, unspecified: Secondary | ICD-10-CM | POA: Insufficient documentation

## 2021-05-28 DIAGNOSIS — Z87891 Personal history of nicotine dependence: Secondary | ICD-10-CM | POA: Diagnosis not present

## 2021-05-28 LAB — CBC WITH DIFFERENTIAL/PLATELET
Abs Immature Granulocytes: 0.02 10*3/uL (ref 0.00–0.07)
Basophils Absolute: 0 10*3/uL (ref 0.0–0.1)
Basophils Relative: 0 %
Eosinophils Absolute: 0.5 10*3/uL (ref 0.0–0.5)
Eosinophils Relative: 5 %
HCT: 29.3 % — ABNORMAL LOW (ref 36.0–46.0)
Hemoglobin: 9 g/dL — ABNORMAL LOW (ref 12.0–15.0)
Immature Granulocytes: 0 %
Lymphocytes Relative: 26 %
Lymphs Abs: 2.5 10*3/uL (ref 0.7–4.0)
MCH: 28.8 pg (ref 26.0–34.0)
MCHC: 30.7 g/dL (ref 30.0–36.0)
MCV: 93.6 fL (ref 80.0–100.0)
Monocytes Absolute: 0.8 10*3/uL (ref 0.1–1.0)
Monocytes Relative: 8 %
Neutro Abs: 6 10*3/uL (ref 1.7–7.7)
Neutrophils Relative %: 61 %
Platelets: 290 10*3/uL (ref 150–400)
RBC: 3.13 MIL/uL — ABNORMAL LOW (ref 3.87–5.11)
RDW: 14.3 % (ref 11.5–15.5)
WBC: 9.8 10*3/uL (ref 4.0–10.5)
nRBC: 0 % (ref 0.0–0.2)

## 2021-05-28 LAB — BASIC METABOLIC PANEL
Anion gap: 9 (ref 5–15)
BUN: 42 mg/dL — ABNORMAL HIGH (ref 8–23)
CO2: 24 mmol/L (ref 22–32)
Calcium: 9.2 mg/dL (ref 8.9–10.3)
Chloride: 104 mmol/L (ref 98–111)
Creatinine, Ser: 2.28 mg/dL — ABNORMAL HIGH (ref 0.44–1.00)
GFR, Estimated: 24 mL/min — ABNORMAL LOW (ref >60)
Glucose, Bld: 148 mg/dL — ABNORMAL HIGH (ref 70–99)
Potassium: 4.5 mmol/L (ref 3.5–5.1)
Sodium: 137 mmol/L (ref 135–145)

## 2021-05-28 LAB — BRAIN NATRIURETIC PEPTIDE: B Natriuretic Peptide: 785.3 pg/mL — ABNORMAL HIGH (ref 0.0–100.0)

## 2021-05-28 MED ORDER — FUROSEMIDE 10 MG/ML IJ SOLN
40.0000 mg | INTRAMUSCULAR | Status: AC
  Administered 2021-05-28: 40 mg via INTRAVENOUS
  Filled 2021-05-28: qty 4

## 2021-05-28 NOTE — ED Notes (Signed)
Patient transported to X-ray 

## 2021-05-28 NOTE — ED Provider Notes (Signed)
[facility] DEPT Provider Note   CSN: [id number] Arrival date & time: 05/28/21  0046     History Chief Complaint  Patient presents with   Leg Swelling    Nancy [name] is a 60 y.o. female.  Patient with past medical history notable for CHF, diabetes, hypertension, hyperlipidemia, presents to the emergency department with a chief complaint of bilateral lower extremity edema.  She states that she has been having worsening edema over the past several days along with a 5 pound weight gain.  She has been compliant with her Lasix.  She reports that she is urinating frequently.  She denies any significant shortness of breath, but states that she sometimes feels slightly winded.  Denies any chest pain.  She denies any fever, chills, cough.  The history is provided by the patient. No language interpreter was used.      Past Medical History:  Diagnosis Date   Anemia 2006   Depression 2014   previously on amitryptiline    Diabetes mellitus with neurological manifestation ([place]) 2006   Diabetic peripheral neuropathy ([place]) 04/26/2020   Fracture of left ankle 1997    Gastroparesis 07/2016   HOH (hard of hearing) 2004    Hyperlipidemia 2006   Hypertension 2006   IBS (irritable bowel syndrome) 2002    Leukopenia 2015    Macular degeneration 11/2019   Shingles 2009    Stroke [facility]) 2020   Thyroid nodule 2004    Patient Active Problem List   Diagnosis Date Noted   Acute CHF (congestive heart failure) ([place]) 05/09/2021   Abnormal nuclear stress test    Dyspnea on exertion 03/07/2021   Elevated brain natriuretic peptide (BNP) level 03/02/2021   Localized edema 02/25/2021   Orthopnea 02/25/2021   History of cerebrovascular accident (CVA) with residual deficit 05/06/2020   Diabetic peripheral neuropathy ([place]) 04/26/2020   ARF (acute renal failure) ([place]) 04/20/2020   Generalized weakness 04/20/2020   Dehydration 04/20/2020   Generalized abdominal pain     Pancreatitis, acute 02/29/2020   Cerebrovascular accident (CVA) ([place]) 11/08/2019   Stenosis of right carotid artery 11/08/2019   Hyperlipidemia associated with type 2 diabetes mellitus ([place]) 11/08/2019   Cortical age-related cataract of both eyes 08/30/2019   Gait abnormality 08/30/2019   Statin declined 08/30/2019   Gastroesophageal reflux disease without esophagitis 04/18/2018   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   Uncontrolled type 2 diabetes mellitus with diabetic neuropathy, with long-term current use of insulin ([place]) 10/29/2017   History of macular degeneration 10/29/2017   Chronic cervical pain 10/29/2016   Myalgia 09/14/2016   Insomnia 09/14/2016   Tinea pedis of both feet 08/20/2016   Macular degeneration 08/17/2016   Low back pain 09/21/2014   Hypertension 09/21/2014   Diabetic gastroparesis associated with type 2 diabetes mellitus ([place]) 09/14/2014   Thyroid nodule 08/17/2014   Obesity (BMI 30-39.9) 08/17/2014   Hyperlipidemia 08/05/2014   Nausea & vomiting 08/04/2014   Type II diabetes mellitus with neurological manifestations, uncontrolled ([place]) 08/04/2014   Elevated serum protein level 08/04/2014    Past Surgical History:  Procedure Laterality Date   ABDOMINAL HYSTERECTOMY  2005   CATARACT EXTRACTION Left 11/2019   CESAREAN SECTION  1983    CHOLECYSTECTOMY N/A 03/05/2020   Procedure: LAPAROSCOPIC CHOLECYSTECTOMY WITH INTRAOPERATIVE CHOLANGIOGRAM;  Surgeon: Donnie Mesa, MD;  Location: [facility];  Service: General;  Laterality: N/A;   ESOPHAGOGASTRODUODENOSCOPY (EGD) WITH PROPOFOL Left 08/26/2014   Procedure: ESOPHAGOGASTRODUODENOSCOPY (EGD) WITH PROPOFOL;  Surgeon: Arta Silence, MD;  Location: [facility] END[facility];  Service: Endoscopy;  Laterality: Left;   ESOPHAGOGASTRODUODENOSCOPY (EGD) WITH PROPOFOL N/A 03/03/2020   Procedure: ESOPHAGOGASTRODUODENOSCOPY (EGD) WITH PROPOFOL;  Surgeon: Jerene Bears, MD;  Location: [facility];  Service: Gastroenterology;  Laterality: N/A;    RIGHT/LEFT HEART CATH AND CORONARY ANGIOGRAPHY N/A 04/27/2021   Procedure: RIGHT/LEFT HEART CATH AND CORONARY ANGIOGRAPHY;  Surgeon: [name], MD;  Location: [facility];  Service: Cardiovascular;  Laterality: N/A;     OB History   No obstetric history on file.     Family History  Problem Relation Age of Onset   Hypertension Mother    Heart disease Mother    Diabetes Mother    Thyroid disease Mother    Congestive Heart Failure Mother    Breast cancer Maternal Grandmother    Colon cancer Maternal Grandfather    Heart attack Sister    Heart disease Brother    Hyperlipidemia Brother    Hypertension Brother    Diabetes Father    Breast cancer Maternal Aunt     Social History   Tobacco Use   Smoking status: Former    Packs/day: 0.25    Years: 0.50    Pack years: 0.13    Types: Cigarettes   Smokeless tobacco: Never   Tobacco comments:    quit yrs ago  Vaping Use   Vaping Use: Never used  Substance Use Topics   Alcohol use: No   Drug use: No    Home Medications Prior to Admission medications   Medication Sig Start Date End Date Taking? Authorizing Provider  acetaminophen (TYLENOL) 500 MG tablet Take 500 mg by mouth every 6 (six) hours as needed for moderate pain or headache.    [provider]  albuterol (VENTOLIN HFA) 108 (90 Base) MCG/ACT inhaler Inhale 1-2 puffs into the lungs every 6 (six) hours as needed for wheezing or shortness of breath.    [provider]  amLODipine (NORVASC) 2.5 MG tablet Take 1 tablet (2.5 mg total) by mouth daily. 04/18/21   [name], MD  aspirin EC 81 MG tablet Take 1 tablet (81 mg total) by mouth daily. 11/05/19   [name], MD  Blood Glucose Monitoring Suppl (ONETOUCH VERIO) w/Device KIT Check blood sugar three times daily. E11.40 05/06/20   [name], MD  Blood Glucose Monitoring Suppl (TRUE METRIX METER) w/Device KIT Check blood sugar three times daily. E11.40 07/11/20   [name], MD  DULoxetine (CYMBALTA) 20 MG capsule TAKE 1 CAPSULE (20 MG TOTAL) BY MOUTH DAILY. Patient taking differently: Take 20 mg by mouth daily. 02/06/21 02/06/22  Salley Slaughter, NP  furosemide (LASIX) 20 MG tablet Take 2 tablets (40 mg total) by mouth daily. Patient taking differently: Take 20 mg by mouth 2 (two) times daily. 04/18/21   [name], MD  glucose blood test strip Use as instructedCheck blood sugar three times daily. E11.40 07/11/20   [name], MD  insulin glargine (LANTUS) 100 UNIT/ML Solostar Pen Inject 58 Units into the skin at bedtime. Patient taking differently: Inject 55 Units into the skin at bedtime. 07/05/20   Camillia Herter, NP  insulin lispro (HUMALOG KWIKPEN) 100 UNIT/ML KwikPen Inject 4 units before breakfast and dinner. 07/05/20   Camillia Herter, NP  metoprolol tartrate (LOPRESSOR) 50 MG tablet Take 1 tablet (50 mg total) by mouth 2 (two) times daily. 04/18/21   [name], MD  mirtazapine (REMERON) 15 MG tablet  TAKE 1 TABLET (15 MG TOTAL) BY MOUTH AT BEDTIME. Patient taking differently: Take 15 mg by mouth at bedtime. 02/06/21 02/06/22  Eulis Canner E, NP  risperiDONE (RISPERDAL) 3 MG tablet TAKE 1 TABLET (3 MG TOTAL) BY MOUTH AT BEDTIME. Patient taking differently: Take 3 mg by mouth at bedtime. 02/06/21 02/06/22  Salley Slaughter, NP  Insulin NPH Isophane & Regular (RELION 70/30 [place]) Inject 35 Units into the skin 2 (two) times daily.  08/17/14  [provider]    Allergies    Elemental sulfur, Hydralazine hcl, Hydrocodone, Metformin and related, Other, Plaquenil [hydroxychloroquine sulfate], Shellfish-derived products, Shrimp (diagnostic), and Sulfa antibiotics  Review of Systems   Review of Systems  All other systems reviewed and are negative.  Physical Exam Updated Vital Signs BP (!) 193/88   Pulse 76   Temp 99.2 F (37.3 C) (Oral)   Resp (!) 21   Ht $R'5\' 3"'zn$  (1.6 m)   Wt 104.8 kg   SpO2 100%   BMI 40.92 kg/m    Physical Exam Vitals and nursing note reviewed.  Constitutional:      General: She is not in acute distress.    Appearance: She is well-developed.  HENT:     Head: Normocephalic and atraumatic.  Eyes:     Conjunctiva/sclera: Conjunctivae normal.  Cardiovascular:     Rate and Rhythm: Normal rate and regular rhythm.     Heart sounds: No murmur heard. Pulmonary:     Effort: Pulmonary effort is normal. No respiratory distress.     Breath sounds: Normal breath sounds.     Comments: Lung sounds are clear, no respiratory distress Abdominal:     Palpations: Abdomen is soft.     Tenderness: There is no abdominal tenderness.  Musculoskeletal:     Cervical back: Neck supple.     Right lower leg: Edema present.     Left lower leg: Edema present.  Skin:    General: Skin is warm and dry.  Neurological:     Mental Status: She is alert and oriented to person, place, and time.  Psychiatric:        Mood and Affect: Mood normal.        Behavior: Behavior normal.    ED Results / Procedures / Treatments   Labs (all labs ordered are listed, but only abnormal results are displayed) Labs Reviewed  CBC WITH DIFFERENTIAL/PLATELET - Abnormal; Notable for the following components:      Result Value   RBC 3.13 (*)    Hemoglobin 9.0 (*)    HCT 29.3 (*)    All other components within normal limits  BASIC METABOLIC PANEL - Abnormal; Notable for the following components:   Glucose, Bld 148 (*)    BUN 42 (*)    Creatinine, Ser 2.28 (*)    GFR, Estimated 24 (*)    All other components within normal limits  BRAIN NATRIURETIC PEPTIDE - Abnormal; Notable for the following components:   B Natriuretic Peptide 785.3 (*)    All other components within normal limits    EKG None  Radiology DG Chest 2 View  Result Date: 05/28/2021 CLINICAL DATA:  Shortness of breath EXAM: CHEST - 2 VIEW COMPARISON:  05/09/2021 FINDINGS: Small bilateral pleural effusions. Bibasilar atelectasis or infiltrates. Heart  is normal size. No effusions or acute bony abnormality. IMPRESSION: Small bilateral pleural effusions with bibasilar atelectasis or infiltrates. Electronically Signed   By: Rolm Baptise M.D.   On: 05/28/2021 02:18  Procedures Procedures   Medications Ordered in ED Medications  furosemide (LASIX) injection 40 mg (40 mg Intravenous Given 05/28/21 0241)    ED Course  I have reviewed the triage vital signs and the nursing notes.  Pertinent labs & imaging results that were available during my care of the patient were reviewed by me and considered in my medical decision making (see chart for details).    MDM Rules/Calculators/A&P                          Patient here with bilateral peripheral edema.  She has pitting edema.  She does have history of CHF.  Laboratory work-up notable for no leukocytosis, hemoglobin is about baseline at 9.0, creatinine is about baseline at 2.28, BNP is increased from previous at 785.3.  Chest x-ray shows small bilateral pleural effusions without bibasilar atelectasis or infiltrates.  Will give patient 40 IV Lasix and reassess.  Patient feels significantly improved after being diuresed in the ED.  She is able to ambulate to the bathroom without difficulty.  We discussed admission versus discharge.  Patient would like to be discharged.  I have encouraged her to take an additional tablet of Lasix with each dose for the next few days and follow-up with her doctor.  Patient understands agrees the plan.  She is in no acute distress.  She is discharged in good condition.  Final Clinical Impression(s) / ED Diagnoses Final diagnoses:  Peripheral edema    Rx / DC Orders ED Discharge Orders     None        Montine Circle, PA-C 05/28/21 0442    Maudie Flakes, MD 05/28/21 [phone]

## 2021-05-28 NOTE — Discharge Instructions (Addendum)
You can take an additional tablet of furosemide at each dose for the next 3 days.  Please contact your doctor and schedule a follow-up appointment.  Return to the emergency department if your symptoms worsen.

## 2021-05-28 NOTE — ED Triage Notes (Signed)
Pt c/o bilateral leg swelling that she noticed this afternoon. Pt states she fell asleep in a chair and woke up and her legs were sore. She weighed herself and states that she gained 5 lbs. Pt has hx of CHF

## 2021-06-05 ENCOUNTER — Other Ambulatory Visit: Payer: Self-pay

## 2021-06-05 ENCOUNTER — Other Ambulatory Visit: Payer: Self-pay | Admitting: Family

## 2021-06-05 DIAGNOSIS — IMO0002 Reserved for concepts with insufficient information to code with codable children: Secondary | ICD-10-CM

## 2021-06-05 MED FILL — Risperidone Tab 3 MG: ORAL | 30 days supply | Qty: 30 | Fill #0 | Status: CN

## 2021-06-06 ENCOUNTER — Other Ambulatory Visit: Payer: Self-pay

## 2021-06-07 ENCOUNTER — Other Ambulatory Visit: Payer: Self-pay

## 2021-06-09 ENCOUNTER — Other Ambulatory Visit: Payer: Self-pay

## 2021-06-10 ENCOUNTER — Emergency Department (HOSPITAL_COMMUNITY): Payer: Medicaid Other

## 2021-06-10 ENCOUNTER — Other Ambulatory Visit: Payer: Self-pay

## 2021-06-10 ENCOUNTER — Inpatient Hospital Stay (HOSPITAL_COMMUNITY)
Admission: EM | Admit: 2021-06-10 | Discharge: 2021-06-15 | DRG: 291 | Disposition: A | Discharge status: Home / Self Care | Payer: Medicaid Other | Attending: Internal Medicine | Admitting: Internal Medicine

## 2021-06-10 ENCOUNTER — Encounter (HOSPITAL_COMMUNITY): Payer: Self-pay | Admitting: Internal Medicine

## 2021-06-10 DIAGNOSIS — E785 Hyperlipidemia, unspecified: Secondary | ICD-10-CM | POA: Diagnosis present

## 2021-06-10 DIAGNOSIS — Z83438 Family history of other disorder of lipoprotein metabolism and other lipidemia: Secondary | ICD-10-CM | POA: Diagnosis not present

## 2021-06-10 DIAGNOSIS — F419 Anxiety disorder, unspecified: Secondary | ICD-10-CM | POA: Diagnosis present

## 2021-06-10 DIAGNOSIS — Z87891 Personal history of nicotine dependence: Secondary | ICD-10-CM

## 2021-06-10 DIAGNOSIS — H25013 Cortical age-related cataract, bilateral: Secondary | ICD-10-CM | POA: Diagnosis present

## 2021-06-10 DIAGNOSIS — K219 Gastro-esophageal reflux disease without esophagitis: Secondary | ICD-10-CM | POA: Diagnosis present

## 2021-06-10 DIAGNOSIS — Z6841 Body Mass Index (BMI) 40.0 and over, adult: Secondary | ICD-10-CM

## 2021-06-10 DIAGNOSIS — Z20822 Contact with and (suspected) exposure to covid-19: Secondary | ICD-10-CM | POA: Diagnosis present

## 2021-06-10 DIAGNOSIS — I6930 Unspecified sequelae of cerebral infarction: Secondary | ICD-10-CM

## 2021-06-10 DIAGNOSIS — Z833 Family history of diabetes mellitus: Secondary | ICD-10-CM

## 2021-06-10 DIAGNOSIS — Z882 Allergy status to sulfonamides status: Secondary | ICD-10-CM

## 2021-06-10 DIAGNOSIS — N184 Chronic kidney disease, stage 4 (severe): Secondary | ICD-10-CM | POA: Diagnosis not present

## 2021-06-10 DIAGNOSIS — N179 Acute kidney failure, unspecified: Secondary | ICD-10-CM | POA: Diagnosis not present

## 2021-06-10 DIAGNOSIS — H3530 Unspecified macular degeneration: Secondary | ICD-10-CM | POA: Diagnosis present

## 2021-06-10 DIAGNOSIS — Z794 Long term (current) use of insulin: Secondary | ICD-10-CM

## 2021-06-10 DIAGNOSIS — E1165 Type 2 diabetes mellitus with hyperglycemia: Secondary | ICD-10-CM | POA: Diagnosis present

## 2021-06-10 DIAGNOSIS — K5900 Constipation, unspecified: Secondary | ICD-10-CM | POA: Diagnosis present

## 2021-06-10 DIAGNOSIS — I509 Heart failure, unspecified: Secondary | ICD-10-CM

## 2021-06-10 DIAGNOSIS — F32A Depression, unspecified: Secondary | ICD-10-CM | POA: Diagnosis not present

## 2021-06-10 DIAGNOSIS — Z9049 Acquired absence of other specified parts of digestive tract: Secondary | ICD-10-CM

## 2021-06-10 DIAGNOSIS — I130 Hypertensive heart and chronic kidney disease with heart failure and stage 1 through stage 4 chronic kidney disease, or unspecified chronic kidney disease: Principal | ICD-10-CM | POA: Diagnosis present

## 2021-06-10 DIAGNOSIS — Z885 Allergy status to narcotic agent status: Secondary | ICD-10-CM

## 2021-06-10 DIAGNOSIS — E1143 Type 2 diabetes mellitus with diabetic autonomic (poly)neuropathy: Secondary | ICD-10-CM | POA: Diagnosis not present

## 2021-06-10 DIAGNOSIS — Z8249 Family history of ischemic heart disease and other diseases of the circulatory system: Secondary | ICD-10-CM

## 2021-06-10 DIAGNOSIS — E1122 Type 2 diabetes mellitus with diabetic chronic kidney disease: Secondary | ICD-10-CM | POA: Diagnosis present

## 2021-06-10 DIAGNOSIS — E11649 Type 2 diabetes mellitus with hypoglycemia without coma: Secondary | ICD-10-CM | POA: Diagnosis not present

## 2021-06-10 DIAGNOSIS — Z888 Allergy status to other drugs, medicaments and biological substances status: Secondary | ICD-10-CM

## 2021-06-10 DIAGNOSIS — Z79899 Other long term (current) drug therapy: Secondary | ICD-10-CM

## 2021-06-10 DIAGNOSIS — IMO0002 Reserved for concepts with insufficient information to code with codable children: Secondary | ICD-10-CM | POA: Diagnosis present

## 2021-06-10 DIAGNOSIS — E875 Hyperkalemia: Secondary | ICD-10-CM | POA: Diagnosis not present

## 2021-06-10 DIAGNOSIS — K589 Irritable bowel syndrome without diarrhea: Secondary | ICD-10-CM | POA: Diagnosis present

## 2021-06-10 DIAGNOSIS — H9190 Unspecified hearing loss, unspecified ear: Secondary | ICD-10-CM | POA: Diagnosis present

## 2021-06-10 DIAGNOSIS — Z7982 Long term (current) use of aspirin: Secondary | ICD-10-CM

## 2021-06-10 DIAGNOSIS — I5043 Acute on chronic combined systolic (congestive) and diastolic (congestive) heart failure: Secondary | ICD-10-CM | POA: Diagnosis present

## 2021-06-10 DIAGNOSIS — E1169 Type 2 diabetes mellitus with other specified complication: Secondary | ICD-10-CM | POA: Diagnosis not present

## 2021-06-10 DIAGNOSIS — I160 Hypertensive urgency: Secondary | ICD-10-CM | POA: Diagnosis present

## 2021-06-10 DIAGNOSIS — E1142 Type 2 diabetes mellitus with diabetic polyneuropathy: Secondary | ICD-10-CM | POA: Diagnosis present

## 2021-06-10 DIAGNOSIS — Z90710 Acquired absence of both cervix and uterus: Secondary | ICD-10-CM

## 2021-06-10 DIAGNOSIS — Z91013 Allergy to seafood: Secondary | ICD-10-CM

## 2021-06-10 DIAGNOSIS — I5032 Chronic diastolic (congestive) heart failure: Secondary | ICD-10-CM | POA: Insufficient documentation

## 2021-06-10 DIAGNOSIS — I5033 Acute on chronic diastolic (congestive) heart failure: Secondary | ICD-10-CM

## 2021-06-10 LAB — BASIC METABOLIC PANEL
Anion gap: 9 (ref 5–15)
BUN: 34 mg/dL — ABNORMAL HIGH (ref 8–23)
CO2: 24 mmol/L (ref 22–32)
Calcium: 8.6 mg/dL — ABNORMAL LOW (ref 8.9–10.3)
Chloride: 103 mmol/L (ref 98–111)
Creatinine, Ser: 2.61 mg/dL — ABNORMAL HIGH (ref 0.44–1.00)
GFR, Estimated: 20 mL/min — ABNORMAL LOW (ref >60)
Glucose, Bld: 185 mg/dL — ABNORMAL HIGH (ref 70–99)
Potassium: 4.8 mmol/L (ref 3.5–5.1)
Sodium: 136 mmol/L (ref 135–145)

## 2021-06-10 LAB — CBC WITH DIFFERENTIAL/PLATELET
Abs Immature Granulocytes: 0.02 10*3/uL (ref 0.00–0.07)
Basophils Absolute: 0 10*3/uL (ref 0.0–0.1)
Basophils Relative: 0 %
Eosinophils Absolute: 0.4 10*3/uL (ref 0.0–0.5)
Eosinophils Relative: 5 %
HCT: 28.1 % — ABNORMAL LOW (ref 36.0–46.0)
Hemoglobin: 8.4 g/dL — ABNORMAL LOW (ref 12.0–15.0)
Immature Granulocytes: 0 %
Lymphocytes Relative: 27 %
Lymphs Abs: 2.1 10*3/uL (ref 0.7–4.0)
MCH: 28.4 pg (ref 26.0–34.0)
MCHC: 29.9 g/dL — ABNORMAL LOW (ref 30.0–36.0)
MCV: 94.9 fL (ref 80.0–100.0)
Monocytes Absolute: 0.7 10*3/uL (ref 0.1–1.0)
Monocytes Relative: 9 %
Neutro Abs: 4.8 10*3/uL (ref 1.7–7.7)
Neutrophils Relative %: 59 %
Platelets: 216 10*3/uL (ref 150–400)
RBC: 2.96 MIL/uL — ABNORMAL LOW (ref 3.87–5.11)
RDW: 14.1 % (ref 11.5–15.5)
WBC: 8.1 10*3/uL (ref 4.0–10.5)
nRBC: 0 % (ref 0.0–0.2)

## 2021-06-10 LAB — GLUCOSE, CAPILLARY
Glucose-Capillary: 138 mg/dL — ABNORMAL HIGH (ref 70–99)
Glucose-Capillary: 187 mg/dL — ABNORMAL HIGH (ref 70–99)

## 2021-06-10 LAB — BRAIN NATRIURETIC PEPTIDE: B Natriuretic Peptide: 511.7 pg/mL — ABNORMAL HIGH (ref 0.0–100.0)

## 2021-06-10 MED ORDER — FUROSEMIDE 10 MG/ML IJ SOLN
60.0000 mg | Freq: Two times a day (BID) | INTRAMUSCULAR | Status: DC
  Administered 2021-06-10 – 2021-06-11 (×3): 60 mg via INTRAVENOUS
  Filled 2021-06-10 (×3): qty 6

## 2021-06-10 MED ORDER — INSULIN GLARGINE 100 UNIT/ML ~~LOC~~ SOLN
45.0000 [IU] | Freq: Every day | SUBCUTANEOUS | Status: DC
  Administered 2021-06-10 – 2021-06-11 (×2): 45 [IU] via SUBCUTANEOUS
  Filled 2021-06-10 (×2): qty 0.45

## 2021-06-10 MED ORDER — RISPERIDONE 1 MG PO TABS
3.0000 mg | ORAL_TABLET | Freq: Every day | ORAL | Status: DC
  Administered 2021-06-10 – 2021-06-14 (×5): 3 mg via ORAL
  Filled 2021-06-10 (×5): qty 3

## 2021-06-10 MED ORDER — POLYETHYLENE GLYCOL 3350 17 G PO PACK
17.0000 g | PACK | Freq: Every day | ORAL | Status: AC
  Administered 2021-06-10 – 2021-06-11 (×2): 17 g via ORAL
  Filled 2021-06-10 (×3): qty 1

## 2021-06-10 MED ORDER — DOCUSATE SODIUM 100 MG PO CAPS
100.0000 mg | ORAL_CAPSULE | Freq: Two times a day (BID) | ORAL | Status: DC
  Administered 2021-06-10 – 2021-06-14 (×7): 100 mg via ORAL
  Filled 2021-06-10 (×9): qty 1

## 2021-06-10 MED ORDER — INSULIN ASPART 100 UNIT/ML IJ SOLN
0.0000 [IU] | Freq: Three times a day (TID) | INTRAMUSCULAR | Status: DC
  Administered 2021-06-10 – 2021-06-11 (×2): 2 [IU] via SUBCUTANEOUS
  Filled 2021-06-10: qty 0.15

## 2021-06-10 MED ORDER — TRAMADOL HCL 50 MG PO TABS
50.0000 mg | ORAL_TABLET | Freq: Once | ORAL | Status: AC
  Administered 2021-06-10: 50 mg via ORAL
  Filled 2021-06-10: qty 1

## 2021-06-10 MED ORDER — ACETAMINOPHEN 325 MG PO TABS
650.0000 mg | ORAL_TABLET | Freq: Four times a day (QID) | ORAL | Status: DC | PRN
  Administered 2021-06-12 – 2021-06-14 (×4): 650 mg via ORAL
  Filled 2021-06-10 (×5): qty 2

## 2021-06-10 MED ORDER — ALBUTEROL SULFATE (2.5 MG/3ML) 0.083% IN NEBU: 3.0000 mL | INHALATION_SOLUTION | Freq: Four times a day (QID) | RESPIRATORY_TRACT | Status: DC | PRN

## 2021-06-10 MED ORDER — HEPARIN SODIUM (PORCINE) 5000 UNIT/ML IJ SOLN
5000.0000 [IU] | Freq: Three times a day (TID) | INTRAMUSCULAR | Status: DC
  Administered 2021-06-10 – 2021-06-15 (×14): 5000 [IU] via SUBCUTANEOUS
  Filled 2021-06-10 (×14): qty 1

## 2021-06-10 MED ORDER — ASPIRIN EC 81 MG PO TBEC
81.0000 mg | DELAYED_RELEASE_TABLET | Freq: Every day | ORAL | Status: DC
  Administered 2021-06-11 – 2021-06-15 (×5): 81 mg via ORAL
  Filled 2021-06-10 (×5): qty 1

## 2021-06-10 MED ORDER — FUROSEMIDE 10 MG/ML IJ SOLN
40.0000 mg | Freq: Once | INTRAMUSCULAR | Status: AC
  Administered 2021-06-10: 40 mg via INTRAVENOUS
  Filled 2021-06-10: qty 4

## 2021-06-10 MED ORDER — DULOXETINE HCL 20 MG PO CPEP
20.0000 mg | ORAL_CAPSULE | Freq: Every day | ORAL | Status: DC
  Administered 2021-06-10 – 2021-06-15 (×6): 20 mg via ORAL
  Filled 2021-06-10 (×7): qty 1

## 2021-06-10 MED ORDER — METHOCARBAMOL 500 MG PO TABS
500.0000 mg | ORAL_TABLET | Freq: Once | ORAL | Status: AC
  Administered 2021-06-10: 500 mg via ORAL
  Filled 2021-06-10: qty 1

## 2021-06-10 MED ORDER — METOPROLOL TARTRATE 5 MG/5ML IV SOLN
5.0000 mg | Freq: Four times a day (QID) | INTRAVENOUS | Status: DC | PRN
Start: 2021-06-10 — End: 2021-06-15
  Administered 2021-06-15: 5 mg via INTRAVENOUS
  Filled 2021-06-10: qty 5

## 2021-06-10 MED ORDER — ACETAMINOPHEN 650 MG RE SUPP: 650.0000 mg | Freq: Four times a day (QID) | RECTAL | Status: DC | PRN

## 2021-06-10 MED ORDER — METOPROLOL TARTRATE 50 MG PO TABS
50.0000 mg | ORAL_TABLET | Freq: Two times a day (BID) | ORAL | Status: DC
  Administered 2021-06-11 – 2021-06-15 (×9): 50 mg via ORAL
  Filled 2021-06-10 (×9): qty 1

## 2021-06-10 MED ORDER — AMLODIPINE BESYLATE 5 MG PO TABS
2.5000 mg | ORAL_TABLET | Freq: Every day | ORAL | Status: DC
  Administered 2021-06-11 – 2021-06-15 (×5): 2.5 mg via ORAL
  Filled 2021-06-10 (×5): qty 1

## 2021-06-10 MED ORDER — MIRTAZAPINE 15 MG PO TABS
15.0000 mg | ORAL_TABLET | Freq: Every day | ORAL | Status: DC
  Administered 2021-06-10 – 2021-06-14 (×5): 15 mg via ORAL
  Filled 2021-06-10 (×5): qty 1

## 2021-06-10 NOTE — ED Notes (Signed)
Pt ambulated in hallway with cane. O2 sats stayed above 93% on RA. Pt winded while walking.

## 2021-06-10 NOTE — ED Triage Notes (Signed)
Patient states she is retaining fluid, history of chf, says weight has changed from 227 to 241 in past 48 hours. Denies pain.

## 2021-06-10 NOTE — H&P (Signed)
History and Physical    Nancy [name] VZC:[id number] DOB: 1961-03-19 DOA: 06/10/2021  PCP: [name], MD  Patient coming from: Home  Chief Complaint: swelling  HPI: Nancy [name] is a 60 y.o. female with medical history significant of DM, HTN, HFpEF, HLD. Presenting with BLE swelling and dyspnea. She notes chronic BLE swelling. However, 2 days ago she noted increased swelling that was making it difficult to stand. She weighed herself and reports a 14 lbs increase over that time. She began having difficulty lying flat, and she has had to sleep in an upright position during that time. She has also noted that she had become more short of breath with movement. She continued to take her normal lasix; but did not try any different treatments. She decided to come to the ED for help. She denies any other aggravating or alleviating factors.   ED Course: She was found to have BLE edema and an elevated BNP. She was given $RemoveB'40mg'[id number]$  IV lasix. TRH was called for admission.   Review of Systems:  Denies CP, palpitations, cough, syncopal episodes, N/V/D. Reports constipation. Review of systems is otherwise negative for all not mentioned in HPI.   PMHx Past Medical History:  Diagnosis Date   Anemia 2006   Depression 2014   previously on amitryptiline    Diabetes mellitus with neurological manifestation ([place]) 2006   Diabetic peripheral neuropathy ([place]) 04/26/2020   Fracture of left ankle 1997    Gastroparesis 07/2016   HOH (hard of hearing) 2004    Hyperlipidemia 2006   Hypertension 2006   IBS (irritable bowel syndrome) 2002    Leukopenia 2015    Macular degeneration 11/2019   Shingles 2009    Stroke [facility]) 2020   Thyroid nodule 2004    PSHx Past Surgical History:  Procedure Laterality Date   ABDOMINAL HYSTERECTOMY  2005   CATARACT EXTRACTION Left 11/2019   CESAREAN SECTION  1983    CHOLECYSTECTOMY N/A 03/05/2020   Procedure: LAPAROSCOPIC CHOLECYSTECTOMY WITH INTRAOPERATIVE  CHOLANGIOGRAM;  Surgeon: Donnie Mesa, MD;  Location: [facility];  Service: General;  Laterality: N/A;   ESOPHAGOGASTRODUODENOSCOPY (EGD) WITH PROPOFOL Left 08/26/2014   Procedure: ESOPHAGOGASTRODUODENOSCOPY (EGD) WITH PROPOFOL;  Surgeon: Arta Silence, MD;  [facility];  Service: Endoscopy;  Laterality: Left;   ESOPHAGOGASTRODUODENOSCOPY (EGD) WITH PROPOFOL N/A 03/03/2020   Procedure: ESOPHAGOGASTRODUODENOSCOPY (EGD) WITH PROPOFOL;  Surgeon: Jerene Bears, MD;  [facility];  Service: Gastroenterology;  Laterality: N/A;   RIGHT/LEFT HEART CATH AND CORONARY ANGIOGRAPHY N/A 04/27/2021   Procedure: RIGHT/LEFT HEART CATH AND CORONARY ANGIOGRAPHY;  Surgeon: [name], MD;  Location: [place] CV LAB;  Service: Cardiovascular;  Laterality: N/A;    SocHx  reports that she has quit smoking. Her smoking use included cigarettes. She has a 0.13 pack-year smoking history. She has never used smokeless tobacco. She reports that she does not drink alcohol and does not use drugs.  Allergies  Allergen Reactions   Elemental Sulfur Hives and Other (See Comments)    PATIENT STATED THIS, MORE THAN LIKELY, SHOULD HAVE BEEN LOGGED AS "SULFA"   Hydralazine Hcl Other (See Comments)    Hair loss   Hydrocodone Itching and Other (See Comments)    Upset stomach   Metformin And Related Nausea And Vomiting and Other (See Comments)    Stomach pains, also   Other Nausea Only and Other (See Comments)    Lettuce- Does not digest this!!   Plaquenil [Hydroxychloroquine Sulfate] Hives   Shellfish-Derived  Products Nausea Only and Other (See Comments)    Caused an upset stomach   Shrimp (Diagnostic) Nausea Only and Other (See Comments)    Upset stomach    Sulfa Antibiotics Hives    FamHx Family History  Problem Relation Age of Onset   Hypertension Mother    Heart disease Mother    Diabetes Mother    Thyroid disease Mother    Congestive Heart Failure Mother    Breast cancer Maternal  Grandmother    Colon cancer Maternal Grandfather    Heart attack Sister    Heart disease Brother    Hyperlipidemia Brother    Hypertension Brother    Diabetes Father    Breast cancer Maternal Aunt     Prior to Admission medications   Medication Sig Start Date End Date Taking? Authorizing Provider  acetaminophen (TYLENOL) 500 MG tablet Take 500 mg by mouth every 6 (six) hours as needed for moderate pain or headache.    [provider]  albuterol (VENTOLIN HFA) 108 (90 Base) MCG/ACT inhaler Inhale 1-2 puffs into the lungs every 6 (six) hours as needed for wheezing or shortness of breath.    [provider]  amLODipine (NORVASC) 2.5 MG tablet Take 1 tablet (2.5 mg total) by mouth daily. 04/18/21   Runell Gess, MD  aspirin EC 81 MG tablet Take 1 tablet (81 mg total) by mouth daily. 11/05/19   Marcine Matar, MD  Blood Glucose Monitoring Suppl (ONETOUCH VERIO) w/Device KIT Check blood sugar three times daily. E11.40 05/06/20   Marcine Matar, MD  Blood Glucose Monitoring Suppl (TRUE METRIX METER) w/Device KIT Check blood sugar three times daily. E11.40 07/11/20   Marcine Matar, MD  DULoxetine (CYMBALTA) 20 MG capsule TAKE 1 CAPSULE (20 MG TOTAL) BY MOUTH DAILY. Patient taking differently: Take 20 mg by mouth daily. 02/06/21 02/06/22  Shanna Cisco, NP  furosemide (LASIX) 20 MG tablet Take 2 tablets (40 mg total) by mouth daily. Patient taking differently: Take 20 mg by mouth 2 (two) times daily. 04/18/21   Runell Gess, MD  glucose blood test strip Use as instructedCheck blood sugar three times daily. E11.40 07/11/20   Marcine Matar, MD  insulin glargine (LANTUS) 100 UNIT/ML Solostar Pen Inject 58 Units into the skin at bedtime. Patient taking differently: Inject 55 Units into the skin at bedtime. 07/05/20   Rema Fendt, NP  insulin lispro (HUMALOG KWIKPEN) 100 UNIT/ML KwikPen Inject 4 units before breakfast and dinner. 07/05/20   Rema Fendt, NP   metoprolol tartrate (LOPRESSOR) 50 MG tablet Take 1 tablet (50 mg total) by mouth 2 (two) times daily. 04/18/21   Runell Gess, MD  mirtazapine (REMERON) 15 MG tablet TAKE 1 TABLET (15 MG TOTAL) BY MOUTH AT BEDTIME. Patient taking differently: Take 15 mg by mouth at bedtime. 02/06/21 02/06/22  Toy Cookey E, NP  risperiDONE (RISPERDAL) 3 MG tablet TAKE 1 TABLET (3 MG TOTAL) BY MOUTH AT BEDTIME. Patient taking differently: Take 3 mg by mouth at bedtime. 02/06/21 02/06/22  Shanna Cisco, NP  Insulin NPH Isophane & Regular (RELION 70/30 [place]) Inject 35 Units into the skin 2 (two) times daily.  08/17/14  [provider]    Physical Exam: Vitals:   06/10/21 1238 06/10/21 1341 06/10/21 1349 06/10/21 1400  BP: (!) 155/138 (!) 237/125 (!) 196/87 (!) 147/78  Pulse: 84 86 81 88  Resp: 20 (!) 25 (!) 24 (!) 26  Temp:  TempSrc:      SpO2: 100% 100% 100% 98%  Weight:      Height:        General: 60 y.o. female resting in bed in NAD Eyes: PERRL, normal sclera ENMT: Nares patent w/o discharge, orophaynx clear, dentition normal, ears w/o discharge/lesions/ulcers Neck: Supple, trachea midline Cardiovascular: RRR, +S1, S2, no m/g/r, equal pulses throughout Respiratory: somewhat decreased at bases, no w/r/r, normal WOB GI: BS+, NDNT, no masses noted, no organomegaly noted MSK: No c/c; BLE edema to upper thighs Neuro: A&O x 3, no focal deficits Psyc: Appropriate interaction and affect, calm/cooperative  Labs on Admission: I have personally reviewed following labs and imaging studies  CBC: Recent Labs  Lab 06/10/21 1210  WBC 8.1  NEUTROABS 4.8  HGB 8.4*  HCT 28.1*  MCV 94.9  PLT 588   Basic Metabolic Panel: Recent Labs  Lab 06/10/21 1210  NA 136  K 4.8  CL 103  CO2 24  GLUCOSE 185*  BUN 34*  CREATININE 2.61*  CALCIUM 8.6*   GFR: Estimated Creatinine Clearance: 26.9 mL/min (A) (by C-G formula based on SCr of 2.61 mg/dL (H)). Liver Function Tests: No results  for input(s): AST, ALT, ALKPHOS, BILITOT, PROT, ALBUMIN in the last 168 hours. No results for input(s): LIPASE, AMYLASE in the last 168 hours. No results for input(s): AMMONIA in the last 168 hours. Coagulation Profile: No results for input(s): INR, PROTIME in the last 168 hours. Cardiac Enzymes: No results for input(s): CKTOTAL, CKMB, CKMBINDEX, TROPONINI in the last 168 hours. BNP (last 3 results) No results for input(s): PROBNP in the last 8760 hours. HbA1C: No results for input(s): HGBA1C in the last 72 hours. CBG: No results for input(s): GLUCAP in the last 168 hours. Lipid Profile: No results for input(s): CHOL, HDL, LDLCALC, TRIG, CHOLHDL, LDLDIRECT in the last 72 hours. Thyroid Function Tests: No results for input(s): TSH, T4TOTAL, FREET4, T3FREE, THYROIDAB in the last 72 hours. Anemia Panel: No results for input(s): VITAMINB12, FOLATE, FERRITIN, TIBC, IRON, RETICCTPCT in the last 72 hours. Urine analysis:    Component Value Date/Time   COLORURINE YELLOW 04/20/2020 2008   APPEARANCEUR CLOUDY (A) 04/20/2020 2008   LABSPEC 1.009 04/20/2020 2008   PHURINE 5.0 04/20/2020 2008   GLUCOSEU NEGATIVE 04/20/2020 2008   HGBUR NEGATIVE 04/20/2020 2008   BILIRUBINUR NEGATIVE 04/20/2020 2008   BILIRUBINUR negative 12/26/2017 [place] 04/20/2020 2008   PROTEINUR NEGATIVE 04/20/2020 2008   UROBILINOGEN 0.2 12/26/2017 1458   UROBILINOGEN 0.2 09/07/2014 0857   NITRITE NEGATIVE 04/20/2020 2008   LEUKOCYTESUR NEGATIVE 04/20/2020 2008    Radiological Exams on Admission: DG Chest 2 View  Result Date: 06/10/2021 CLINICAL DATA:  Fluid retention. EXAM: CHEST - 2 VIEW COMPARISON:  Chest x-ray dated May 28, 2021. FINDINGS: Unchanged mild cardiomegaly. Unchanged mild interstitial edema at the lung bases and small bilateral pleural effusions. No consolidation or pneumothorax. No acute osseous abnormality. IMPRESSION: Unchanged mild congestive heart failure. Electronically Signed    By: Titus Dubin M.D.   On: 06/10/2021 13:02    EKG: Independently reviewed. Sinus, no st elevations  Assessment/Plan Acute exacerbation of HFpEF     - admit to inpt, tele     - lasix 60 IV BID, fluid restriction to 1200cc     - daily wt/I&O, echo     - continue home metoprolol  HTN     - continue home metoprolol and norvasc  Constipation     - schedule 3 days miralax  and colace  DM2     - resume home lantus; add SSI, DM diet, glucose checks  HLD     - continue home statin  CKD4     - slightly above baseline; watch nephrotoxins, follow UOP  Anxiety/Depression Hx of tactile hallucinations     - continue home regimen  DVT prophylaxis: lovenox  Code Status: FULL  Family Communication: None at bedside  Consults called: None  Status is: Inpatient  Remains inpatient appropriate because:Inpatient level of care appropriate due to severity of illness  Dispo: The patient is from: Home              Anticipated d/c is to: Home              Patient currently is not medically stable to d/c.   Difficult to place patient No  Time spent coordinating admission: 45 minutes  [facility]  If 7PM-7AM, please contact night-coverage [email or web address]  06/10/2021, 3:03 PM

## 2021-06-10 NOTE — ED Provider Notes (Addendum)
[place] DEPT Provider Note   CSN: [id number] Arrival date & time: 06/10/21  1122     History Chief Complaint  Patient presents with   fluid retention    Nancy [name] is a 60 y.o. female.  The history is provided by the patient and medical records. No language interpreter was used.   60 year old female significant history CHF, diabetes, peripheral neuropathy, prior stroke, hypertension presenting complaining of fluid retention.  Patient report she noticed quite a bit of fluid retention within the past 2 days.  States 2 days ago when she weighed herself she was at 227 pounds but today when she weighed herself it is 241 pounds.  She also endorsed having increased shortness of breath especially with laying down or with exertion.  She has to sleep sitting up.  She does not complain of any fever chills productive cough chest pain abdominal pain.  Denies any recent medication changes and states she has been compliant with her diuretic medication.  She has been monitoring her dietary consumption and monitoring her salt intake as well.  She does endorse tightness to her legs due to the swelling.  Past Medical History:  Diagnosis Date   Anemia 2006   Depression 2014   previously on amitryptiline    Diabetes mellitus with neurological manifestation ([place]) 2006   Diabetic peripheral neuropathy ([place]) 04/26/2020   Fracture of left ankle 1997    Gastroparesis 07/2016   HOH (hard of hearing) 2004    Hyperlipidemia 2006   Hypertension 2006   IBS (irritable bowel syndrome) 2002    Leukopenia 2015    Macular degeneration 11/2019   Shingles 2009    Stroke [facility]) 2020   Thyroid nodule 2004    Patient Active Problem List   Diagnosis Date Noted   Acute CHF (congestive heart failure) ([place]) 05/09/2021   Abnormal nuclear stress test    Dyspnea on exertion 03/07/2021   Elevated brain natriuretic peptide (BNP) level 03/02/2021   Localized edema 02/25/2021    Orthopnea 02/25/2021   History of cerebrovascular accident (CVA) with residual deficit 05/06/2020   Diabetic peripheral neuropathy ([place]) 04/26/2020   ARF (acute renal failure) ([place]) 04/20/2020   Generalized weakness 04/20/2020   Dehydration 04/20/2020   Generalized abdominal pain    Pancreatitis, acute 02/29/2020   Cerebrovascular accident (CVA) ([place]) 11/08/2019   Stenosis of right carotid artery 11/08/2019   Hyperlipidemia associated with type 2 diabetes mellitus ([place]) 11/08/2019   Cortical age-related cataract of both eyes 08/30/2019   Gait abnormality 08/30/2019   Statin declined 08/30/2019   Gastroesophageal reflux disease without esophagitis 04/18/2018   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   Uncontrolled type 2 diabetes mellitus with diabetic neuropathy, with long-term current use of insulin ([place]) 10/29/2017   History of macular degeneration 10/29/2017   Chronic cervical pain 10/29/2016   Myalgia 09/14/2016   Insomnia 09/14/2016   Tinea pedis of both feet 08/20/2016   Macular degeneration 08/17/2016   Low back pain 09/21/2014   Hypertension 09/21/2014   Diabetic gastroparesis associated with type 2 diabetes mellitus ([place]) 09/14/2014   Thyroid nodule 08/17/2014   Obesity (BMI 30-39.9) 08/17/2014   Hyperlipidemia 08/05/2014   Nausea & vomiting 08/04/2014   Type II diabetes mellitus with neurological manifestations, uncontrolled ([place]) 08/04/2014   Elevated serum protein level 08/04/2014    Past Surgical History:  Procedure Laterality Date   ABDOMINAL HYSTERECTOMY  2005   CATARACT EXTRACTION Left 11/2019   CESAREAN SECTION  1983    CHOLECYSTECTOMY  N/A 03/05/2020   Procedure: LAPAROSCOPIC CHOLECYSTECTOMY WITH INTRAOPERATIVE CHOLANGIOGRAM;  Surgeon: Donnie Mesa, MD;  [facility];  Service: General;  Laterality: N/A;   ESOPHAGOGASTRODUODENOSCOPY (EGD) WITH PROPOFOL Left 08/26/2014   Procedure: ESOPHAGOGASTRODUODENOSCOPY (EGD) WITH PROPOFOL;  Surgeon: Arta Silence, MD;  [facility];  Service: Endoscopy;  Laterality: Left;   ESOPHAGOGASTRODUODENOSCOPY (EGD) WITH PROPOFOL N/A 03/03/2020   Procedure: ESOPHAGOGASTRODUODENOSCOPY (EGD) WITH PROPOFOL;  Surgeon: Jerene Bears, MD;  [facility];  Service: Gastroenterology;  Laterality: N/A;   RIGHT/LEFT HEART CATH AND CORONARY ANGIOGRAPHY N/A 04/27/2021   Procedure: RIGHT/LEFT HEART CATH AND CORONARY ANGIOGRAPHY;  Surgeon: [name], MD;  Location: [place] CV LAB;  Service: Cardiovascular;  Laterality: N/A;     OB History   No obstetric history on file.     Family History  Problem Relation Age of Onset   Hypertension Mother    Heart disease Mother    Diabetes Mother    Thyroid disease Mother    Congestive Heart Failure Mother    Breast cancer Maternal Grandmother    Colon cancer Maternal Grandfather    Heart attack Sister    Heart disease Brother    Hyperlipidemia Brother    Hypertension Brother    Diabetes Father    Breast cancer Maternal Aunt     Social History   Tobacco Use   Smoking status: Former    Packs/day: 0.25    Years: 0.50    Pack years: 0.13    Types: Cigarettes   Smokeless tobacco: Never   Tobacco comments:    quit yrs ago  Vaping Use   Vaping Use: Never used  Substance Use Topics   Alcohol use: No   Drug use: No    Home Medications Prior to Admission medications   Medication Sig Start Date End Date Taking? Authorizing Provider  acetaminophen (TYLENOL) 500 MG tablet Take 500 mg by mouth every 6 (six) hours as needed for moderate pain or headache.    [provider]  albuterol (VENTOLIN HFA) 108 (90 Base) MCG/ACT inhaler Inhale 1-2 puffs into the lungs every 6 (six) hours as needed for wheezing or shortness of breath.    [provider]  amLODipine (NORVASC) 2.5 MG tablet Take 1 tablet (2.5 mg total) by mouth daily. 04/18/21   [name], MD  aspirin EC 81 MG tablet Take 1 tablet (81 mg total) by mouth daily. 11/05/19   [name], MD  Blood Glucose Monitoring Suppl (ONETOUCH VERIO) w/Device KIT Check blood sugar three times daily. E11.40 05/06/20   [name], MD  Blood Glucose Monitoring Suppl (TRUE METRIX METER) w/Device KIT Check blood sugar three times daily. E11.40 07/11/20   [name], MD  DULoxetine (CYMBALTA) 20 MG capsule TAKE 1 CAPSULE (20 MG TOTAL) BY MOUTH DAILY. Patient taking differently: Take 20 mg by mouth daily. 02/06/21 02/06/22  Salley Slaughter, NP  furosemide (LASIX) 20 MG tablet Take 2 tablets (40 mg total) by mouth daily. Patient taking differently: Take 20 mg by mouth 2 (two) times daily. 04/18/21   [name], MD  glucose blood test strip Use as instructedCheck blood sugar three times daily. E11.40 07/11/20   [name], MD  insulin glargine (LANTUS) 100 UNIT/ML Solostar Pen Inject 58 Units into the skin at bedtime. Patient taking differently: Inject 55 Units into the skin at bedtime. 07/05/20   Camillia Herter, NP  insulin lispro (HUMALOG KWIKPEN) 100 UNIT/ML KwikPen  Inject 4 units before breakfast and dinner. 07/05/20   Camillia Herter, NP  metoprolol tartrate (LOPRESSOR) 50 MG tablet Take 1 tablet (50 mg total) by mouth 2 (two) times daily. 04/18/21   [name], MD  mirtazapine (REMERON) 15 MG tablet TAKE 1 TABLET (15 MG TOTAL) BY MOUTH AT BEDTIME. Patient taking differently: Take 15 mg by mouth at bedtime. 02/06/21 02/06/22  Eulis Canner E, NP  risperiDONE (RISPERDAL) 3 MG tablet TAKE 1 TABLET (3 MG TOTAL) BY MOUTH AT BEDTIME. Patient taking differently: Take 3 mg by mouth at bedtime. 02/06/21 02/06/22  Salley Slaughter, NP  Insulin NPH Isophane & Regular (RELION 70/30 [place]) Inject 35 Units into the skin 2 (two) times daily.  08/17/14  [provider]    Allergies    Elemental sulfur, Hydralazine hcl, Hydrocodone, Metformin and related, Other, Plaquenil [hydroxychloroquine sulfate], Shellfish-derived products, Shrimp (diagnostic), and Sulfa  antibiotics  Review of Systems   Review of Systems  All other systems reviewed and are negative.  Physical Exam Updated Vital Signs BP (!) 188/98   Pulse 85   Temp 98.8 F (37.1 C) (Oral)   Resp 18   Ht _0  (1.6 m)   Wt 109.3 kg   SpO2 100%   BMI 42.69 kg/m   Physical Exam Vitals and nursing note reviewed.  Constitutional:      General: She is not in acute distress.    Appearance: She is well-developed. She is obese.  HENT:     Head: Atraumatic.  Eyes:     Conjunctiva/sclera: Conjunctivae normal.  Neck:     Comments: No JVD Cardiovascular:     Rate and Rhythm: Normal rate and regular rhythm.     Pulses: Normal pulses.     Heart sounds: Normal heart sounds.  Pulmonary:     Effort: Pulmonary effort is normal.     Breath sounds: No wheezing, rhonchi or rales.  Abdominal:     Palpations: Abdomen is soft.     Tenderness: There is no abdominal tenderness.  Musculoskeletal:     Cervical back: Neck supple.     Right lower leg: Edema present.     Left lower leg: Edema present.     Comments: 3+ pitting edema to bilateral lower extremities extending towards the knees and thigh.  Intact pedal pulses  Skin:    Findings: No rash.  Neurological:     Mental Status: She is alert and oriented to person, place, and time.  Psychiatric:        Mood and Affect: Mood normal.    ED Results / Procedures / Treatments   Labs (all labs ordered are listed, but only abnormal results are displayed) Labs Reviewed  BASIC METABOLIC PANEL - Abnormal; Notable for the following components:      Result Value   Glucose, Bld 185 (*)    BUN 34 (*)    Creatinine, Ser 2.61 (*)    Calcium 8.6 (*)    GFR, Estimated 20 (*)    All other components within normal limits  BRAIN NATRIURETIC PEPTIDE - Abnormal; Notable for the following components:   B Natriuretic Peptide 511.7 (*)    All other components within normal limits  CBC WITH DIFFERENTIAL/PLATELET - Abnormal; Notable for the following  components:   RBC 2.96 (*)    Hemoglobin 8.4 (*)    HCT 28.1 (*)    MCHC 29.9 (*)    All other components within normal limits  SARS CORONAVIRUS 2 (  TAT 6-24 HRS)    EKG EKG Interpretation  Date/Time:  Saturday June 10 2021 12:19:24 EDT Ventricular Rate:  85 PR Interval:  155 QRS Duration: 84 QT Interval:  378 QTC Calculation: 450 R Axis:   -9 Text Interpretation: Sinus rhythm Probable left atrial enlargement Low voltage, precordial leads Consider anterior infarct Confirmed by Lacretia Leigh ([id number]) on 06/10/2021 2:36:52 PM  Radiology DG Chest 2 View  Result Date: 06/10/2021 CLINICAL DATA:  Fluid retention. EXAM: CHEST - 2 VIEW COMPARISON:  Chest x-ray dated May 28, 2021. FINDINGS: Unchanged mild cardiomegaly. Unchanged mild interstitial edema at the lung bases and small bilateral pleural effusions. No consolidation or pneumothorax. No acute osseous abnormality. IMPRESSION: Unchanged mild congestive heart failure. Electronically Signed   By: Titus Dubin M.D.   On: 06/10/2021 13:02    Procedures Procedures   Medications Ordered in ED Medications  furosemide (LASIX) injection 40 mg (40 mg Intravenous Given 06/10/21 1239)    ED Course  I have reviewed the triage vital signs and the nursing notes.  Pertinent labs & imaging results that were available during my care of the patient were reviewed by me and considered in my medical decision making (see chart for details).    MDM Rules/Calculators/A&P                          BP (!) 188/98   Pulse 85   Temp 98.8 F (37.1 C) (Oral)   Resp 18   Ht _0  (1.6 m)   Wt 109.3 kg   SpO2 100%   BMI 42.69 kg/m   Final Clinical Impression(s) / ED Diagnoses Final diagnoses:  Acute on chronic congestive heart failure, unspecified heart failure type ([place])    Rx / DC Orders ED Discharge Orders     None      12:15 PM Patient here with report of increased fluid retention for the past 2 days.  Her weight was at 227 2 days  ago and now it is at 241 pounds.  She does have moderate peripheral edema to her lower extremities but lung sounds clear and no JVD.  No hypoxia. Last echocardiogram was on 04/05/2021 which demonstrated an EF of 60-65%  1:45 PM Evidence of abnormal renal function with a creatinine of 2.61, increase from baseline.  GFR is 20.  BNP is elevated at 511.  Hemoglobin is 8.4 similar to baseline.  Chest x-ray demonstrate unchanged mild congestive heart failure.  Patient however is hypertensive with blood pressure of 237/125.  2:38 PM On vital sign recheck, blood pressure is 147/78 without any intervention.  When ambulating, O2 sats is at 93% but patient was symptomatic.  Care discussed with Dr. Zenia Resides, plan to have patient admitted for further managements of her CHF.  2:48 PM Appreciate consultation from [facility] hospitalist, Dr. Marylyn Ishihara who agrees to see and admit patient for further care.   Domenic Moras, PA-C 06/10/21 1452    Domenic Moras, PA-C 06/10/21 1452    Lacretia Leigh, MD 06/13/21 1052

## 2021-06-10 NOTE — Plan of Care (Signed)
°  Problem: Coping: °Goal: Level of anxiety will decrease °Outcome: Progressing °  °

## 2021-06-11 ENCOUNTER — Inpatient Hospital Stay (HOSPITAL_COMMUNITY): Payer: Medicaid Other

## 2021-06-11 DIAGNOSIS — I5031 Acute diastolic (congestive) heart failure: Secondary | ICD-10-CM

## 2021-06-11 DIAGNOSIS — N184 Chronic kidney disease, stage 4 (severe): Secondary | ICD-10-CM

## 2021-06-11 DIAGNOSIS — I5023 Acute on chronic systolic (congestive) heart failure: Secondary | ICD-10-CM

## 2021-06-11 LAB — COMPREHENSIVE METABOLIC PANEL
ALT: 12 U/L (ref 0–44)
AST: 10 U/L — ABNORMAL LOW (ref 15–41)
Albumin: 3.1 g/dL — ABNORMAL LOW (ref 3.5–5.0)
Alkaline Phosphatase: 52 U/L (ref 38–126)
Anion gap: 5 (ref 5–15)
BUN: 36 mg/dL — ABNORMAL HIGH (ref 8–23)
CO2: 26 mmol/L (ref 22–32)
Calcium: 8.4 mg/dL — ABNORMAL LOW (ref 8.9–10.3)
Chloride: 106 mmol/L (ref 98–111)
Creatinine, Ser: 2.49 mg/dL — ABNORMAL HIGH (ref 0.44–1.00)
GFR, Estimated: 21 mL/min — ABNORMAL LOW (ref >60)
Glucose, Bld: 107 mg/dL — ABNORMAL HIGH (ref 70–99)
Potassium: 4.9 mmol/L (ref 3.5–5.1)
Sodium: 137 mmol/L (ref 135–145)
Total Bilirubin: 0.6 mg/dL (ref 0.3–1.2)
Total Protein: 6.5 g/dL (ref 6.5–8.1)

## 2021-06-11 LAB — CBC
HCT: 26.4 % — ABNORMAL LOW (ref 36.0–46.0)
Hemoglobin: 7.9 g/dL — ABNORMAL LOW (ref 12.0–15.0)
MCH: 28.3 pg (ref 26.0–34.0)
MCHC: 29.9 g/dL — ABNORMAL LOW (ref 30.0–36.0)
MCV: 94.6 fL (ref 80.0–100.0)
Platelets: 227 10*3/uL (ref 150–400)
RBC: 2.79 MIL/uL — ABNORMAL LOW (ref 3.87–5.11)
RDW: 14.2 % (ref 11.5–15.5)
WBC: 6.9 10*3/uL (ref 4.0–10.5)
nRBC: 0 % (ref 0.0–0.2)

## 2021-06-11 LAB — ECHOCARDIOGRAM COMPLETE
Area-P 1/2: 4.21 cm2
Height: 63 in
S' Lateral: 2.6 cm
Weight: 3767.22 oz

## 2021-06-11 LAB — GLUCOSE, CAPILLARY
Glucose-Capillary: 77 mg/dL (ref 70–99)
Glucose-Capillary: 123 mg/dL — ABNORMAL HIGH (ref 70–99)
Glucose-Capillary: 118 mg/dL — ABNORMAL HIGH (ref 70–99)
Glucose-Capillary: 103 mg/dL — ABNORMAL HIGH (ref 70–99)

## 2021-06-11 LAB — SARS CORONAVIRUS 2 (TAT 6-24 HRS): SARS Coronavirus 2: NEGATIVE

## 2021-06-11 NOTE — Assessment & Plan Note (Addendum)
-   last echo 04/05/21: EF 60-65%, Gr II DD - BNP 511, CXR consistent with pulm edema; she has tense 3+ B/L LE edema - continue IV lasix; hold today due to bump in creatinine, resume tomorrow if renal function better - strict I&O -Continue aspirin, Lopressor

## 2021-06-11 NOTE — Assessment & Plan Note (Addendum)
-   Continue amlodipine and Lopressor and will treat further if needed

## 2021-06-11 NOTE — Progress Notes (Signed)
  Echocardiogram 2D Echocardiogram has been performed.  Randa Lynn Nazier Neyhart 06/11/2021, 2:57 PM

## 2021-06-11 NOTE — Assessment & Plan Note (Signed)
-   Continue SSI and CBG monitoring ?

## 2021-06-11 NOTE — Assessment & Plan Note (Addendum)
-  patient has history of CKD4. Baseline creat ~ 2.3-2.5, eGFR 21-25 -at baseline on admission (mild increase today, holding lasix 1 day to see if improves).  Monitor renal function while on IV diuresis

## 2021-06-11 NOTE — Assessment & Plan Note (Signed)
-   will treat if symptomatic

## 2021-06-11 NOTE — Hospital Course (Signed)
Nancy [name] is a 60 y.o. female with medical history significant of DMII, HTN, HFpEF, HLD. Presenting with BLE swelling and dyspnea. She notes chronic BLE swelling.  However, 2 days PTA she noted increased swelling that was making it difficult to stand. She weighed herself and reports a 14 lbs increase over that time. She began having difficulty lying flat, and she has had to sleep in an upright position during that time. She has also noted that she had become more short of breath with movement. She continued to take her normal lasix; but did not try any different treatments. She decided to come to the ED for help. She denies any other aggravating or alleviating factors.   On work-up in the ER she was noted to have significant lower extremity edema, elevated BNP (511), and CXR was notable for interstitial edema. She was started on IV Lasix and admitted for further work-up and treatment.

## 2021-06-11 NOTE — Assessment & Plan Note (Signed)
-   Continue aspirin - Patient has declined statin in the past

## 2021-06-11 NOTE — Progress Notes (Signed)
Progress Note    Nancy [name]   SWN:[id number]  DOB: [birth year]  DOA: 06/10/2021     1  PCP: [name], MD  CC: SOB, swelling  Hospital Course: Nancy [name] is a 60 y.o. female with medical history significant of DMII, HTN, HFpEF, HLD. Presenting with BLE swelling and dyspnea. She notes chronic BLE swelling.  However, 2 days PTA she noted increased swelling that was making it difficult to stand. She weighed herself and reports a 14 lbs increase over that time. She began having difficulty lying flat, and she has had to sleep in an upright position during that time. She has also noted that she had become more short of breath with movement. She continued to take her normal lasix; but did not try any different treatments. She decided to come to the ED for help. She denies any other aggravating or alleviating factors.   On work-up in the ER she was noted to have significant lower extremity edema, elevated BNP (511), and CXR was notable for interstitial edema. She was started on IV Lasix and admitted for further work-up and treatment.   Interval History:  Seen in her room resting in bed this morning.  States that her breathing has improved some and she can tell a difference from the IV Lasix.  Still has significant swelling in her legs and understands will take several days for some improvement.  ROS: Constitutional: negative for chills and fevers, Respiratory: positive for dyspnea on exertion, negative for cough and sputum, Cardiovascular: negative for chest pain, and Gastrointestinal: negative for abdominal pain  Assessment & Plan: * Acute diastolic CHF (congestive heart failure) ([place]) - last echo 04/05/21: EF 60-65%, Gr II DD - BNP 511, CXR consistent with pulm edema; she has tense 3+ B/L LE edema - continue IV lasix - strict I&O -Continue aspirin, Lopressor  CKD (chronic kidney disease), stage IV ([place]) - patient has history of CKD4. Baseline creat ~ 2.3-2.5,  eGFR 21-25 -Currently at baseline.  Monitor renal function while on IV diuresis  History of cerebrovascular accident (CVA) with residual deficit - Continue aspirin - Patient has declined statin in the past  Gastroesophageal reflux disease without esophagitis - will treat if symptomatic  Hypertension - Continue amlodipine and Lopressor and will treat further if needed  Type II diabetes mellitus with neurological manifestations, uncontrolled ([place]) - Continue SSI and CBG monitoring    Old records reviewed in assessment of this patient  Antimicrobials:   DVT prophylaxis: heparin injection 5,000 Units Start: 06/10/21 2200   Code Status:   Code Status: Full Code Family Communication:   Disposition Plan: Status is: Inpatient  Remains inpatient appropriate because:IV treatments appropriate due to intensity of illness or inability to take PO and Inpatient level of care appropriate due to severity of illness  Dispo: The patient is from: Home              Anticipated d/c is to: Home              Patient currently is not medically stable to d/c.   Difficult to place patient No      Risk of unplanned readmission score: Unplanned Admission- Pilot do not use: 24.61   Objective: Blood pressure (!) 147/92, pulse 65, temperature 98.1 F (36.7 C), temperature source Oral, resp. rate 16, height $RemoveBe'5\' 3"'emyPUhYOD$  (1.6 m), weight 106.8 kg, SpO2 100 %.  Examination: General appearance: alert, cooperative, and no distress Head: Normocephalic, without obvious abnormality, atraumatic Eyes:  EOMI Lungs:  Scattered coarse breath sounds with some crackles, no wheezing Heart: regular rate and rhythm and S1, S2 normal Abdomen: normal findings: bowel sounds normal and soft, non-tender Extremities:  Tense 3+ bilateral lower extremity pitting edema up to knees Skin: mobility and turgor normal Neurologic: Grossly normal  Consultants:    Procedures:    Data Reviewed: I have personally reviewed  following labs and imaging studies Results for orders placed or performed during the hospital encounter of 06/10/21 (from the past 24 hour(s))  SARS CORONAVIRUS 2 (TAT 6-24 HRS) Nasopharyngeal Nasopharyngeal Swab     Status: None   Collection Time: 06/10/21  2:54 PM   Specimen: Nasopharyngeal Swab  Result Value Ref Range   SARS Coronavirus 2 NEGATIVE NEGATIVE  Glucose, capillary     Status: Abnormal   Collection Time: 06/10/21  4:17 PM  Result Value Ref Range   Glucose-Capillary 138 (H) 70 - 99 mg/dL  Glucose, capillary     Status: Abnormal   Collection Time: 06/10/21 10:47 PM  Result Value Ref Range   Glucose-Capillary 187 (H) 70 - 99 mg/dL  Comprehensive metabolic panel     Status: Abnormal   Collection Time: 06/11/21  3:34 AM  Result Value Ref Range   Sodium 137 135 - 145 mmol/L   Potassium 4.9 3.5 - 5.1 mmol/L   Chloride 106 98 - 111 mmol/L   CO2 26 22 - 32 mmol/L   Glucose, Bld 107 (H) 70 - 99 mg/dL   BUN 36 (H) 8 - 23 mg/dL   Creatinine, Ser 2.49 (H) 0.44 - 1.00 mg/dL   Calcium 8.4 (L) 8.9 - 10.3 mg/dL   Total Protein 6.5 6.5 - 8.1 g/dL   Albumin 3.1 (L) 3.5 - 5.0 g/dL   AST 10 (L) 15 - 41 U/L   ALT 12 0 - 44 U/L   Alkaline Phosphatase 52 38 - 126 U/L   Total Bilirubin 0.6 0.3 - 1.2 mg/dL   GFR, Estimated 21 (L) >60 mL/min   Anion gap 5 5 - 15  CBC     Status: Abnormal   Collection Time: 06/11/21  3:34 AM  Result Value Ref Range   WBC 6.9 4.0 - 10.5 K/uL   RBC 2.79 (L) 3.87 - 5.11 MIL/uL   Hemoglobin 7.9 (L) 12.0 - 15.0 g/dL   HCT 26.4 (L) 36.0 - 46.0 %   MCV 94.6 80.0 - 100.0 fL   MCH 28.3 26.0 - 34.0 pg   MCHC 29.9 (L) 30.0 - 36.0 g/dL   RDW 14.2 11.5 - 15.5 %   Platelets 227 150 - 400 K/uL   nRBC 0.0 0.0 - 0.2 %  Glucose, capillary     Status: None   Collection Time: 06/11/21  8:00 AM  Result Value Ref Range   Glucose-Capillary 77 70 - 99 mg/dL  Glucose, capillary     Status: Abnormal   Collection Time: 06/11/21 12:36 PM  Result Value Ref Range    Glucose-Capillary 123 (H) 70 - 99 mg/dL    Recent Results (from the past 240 hour(s))  SARS CORONAVIRUS 2 (TAT 6-24 HRS) Nasopharyngeal Nasopharyngeal Swab     Status: None   Collection Time: 06/10/21  2:54 PM   Specimen: Nasopharyngeal Swab  Result Value Ref Range Status   SARS Coronavirus 2 NEGATIVE NEGATIVE Final    Comment: (NOTE) SARS-CoV-2 target nucleic acids are NOT DETECTED.  The SARS-CoV-2 RNA is generally detectable in upper and lower respiratory specimens during the acute phase  of infection. Negative results do not preclude SARS-CoV-2 infection, do not rule out co-infections with other pathogens, and should not be used as the sole basis for treatment or other patient management decisions. Negative results must be combined with clinical observations, patient history, and epidemiological information. The expected result is Negative.  Fact Sheet for Patients: SugarRoll.be  Fact Sheet for Healthcare Providers: https://www.woods-mathews.com/  This test is not yet approved or cleared by the [place] FDA and  has been authorized for detection and/or diagnosis of SARS-CoV-2 by FDA under an Emergency Use Authorization (EUA). This EUA will remain  in effect (meaning this test can be used) for the duration of the COVID-19 declaration under Se ction 564(b)(1) of the Act, 21 U.S.C. section 360bbb-3(b)(1), unless the authorization is terminated or revoked sooner.  Performed at [facility], [place] [address], [place], [place] [address]      Radiology Studies: DG Chest 2 View  Result Date: 06/10/2021 CLINICAL DATA:  Fluid retention. EXAM: CHEST - 2 VIEW COMPARISON:  Chest x-ray dated May 28, 2021. FINDINGS: Unchanged mild cardiomegaly. Unchanged mild interstitial edema at the lung bases and small bilateral pleural effusions. No consolidation or pneumothorax. No acute osseous abnormality. IMPRESSION: Unchanged mild congestive  heart failure. Electronically Signed   By: Titus Dubin M.D.   On: 06/10/2021 13:02   DG Chest 2 View  Final Result      Scheduled Meds:  amLODipine  2.5 mg Oral Daily   aspirin EC  81 mg Oral Daily   docusate sodium  100 mg Oral BID   DULoxetine  20 mg Oral Daily   furosemide  60 mg Intravenous BID   heparin  5,000 Units Subcutaneous Q8H   insulin aspart  0-15 Units Subcutaneous TID WC   insulin glargine  45 Units Subcutaneous QHS   metoprolol tartrate  50 mg Oral BID   mirtazapine  15 mg Oral QHS   polyethylene glycol  17 g Oral Daily   risperiDONE  3 mg Oral QHS   PRN Meds: acetaminophen **OR** acetaminophen, albuterol, metoprolol tartrate Continuous Infusions:   LOS: 1 day  Time spent: Greater than 50% of the 35 minute visit was spent in counseling/coordination of care for the patient as laid out in the A&P.   Dwyane Dee, MD [facility] 06/11/2021, 12:39 PM

## 2021-06-12 ENCOUNTER — Other Ambulatory Visit: Payer: Self-pay

## 2021-06-12 ENCOUNTER — Encounter (HOSPITAL_COMMUNITY): Payer: Self-pay | Admitting: Internal Medicine

## 2021-06-12 LAB — CBC WITH DIFFERENTIAL/PLATELET
Abs Immature Granulocytes: 0.02 10*3/uL (ref 0.00–0.07)
Basophils Absolute: 0 10*3/uL (ref 0.0–0.1)
Basophils Relative: 0 %
Eosinophils Absolute: 0.4 10*3/uL (ref 0.0–0.5)
Eosinophils Relative: 6 %
HCT: 28.2 % — ABNORMAL LOW (ref 36.0–46.0)
Hemoglobin: 8.3 g/dL — ABNORMAL LOW (ref 12.0–15.0)
Immature Granulocytes: 0 %
Lymphocytes Relative: 31 %
Lymphs Abs: 1.8 10*3/uL (ref 0.7–4.0)
MCH: 28.5 pg (ref 26.0–34.0)
MCHC: 29.4 g/dL — ABNORMAL LOW (ref 30.0–36.0)
MCV: 96.9 fL (ref 80.0–100.0)
Monocytes Absolute: 0.7 10*3/uL (ref 0.1–1.0)
Monocytes Relative: 12 %
Neutro Abs: 2.8 10*3/uL (ref 1.7–7.7)
Neutrophils Relative %: 51 %
Platelets: 231 10*3/uL (ref 150–400)
RBC: 2.91 MIL/uL — ABNORMAL LOW (ref 3.87–5.11)
RDW: 14.1 % (ref 11.5–15.5)
WBC: 5.7 10*3/uL (ref 4.0–10.5)
nRBC: 0 % (ref 0.0–0.2)

## 2021-06-12 LAB — GLUCOSE, CAPILLARY
Glucose-Capillary: 43 mg/dL — CL (ref 70–99)
Glucose-Capillary: 86 mg/dL (ref 70–99)
Glucose-Capillary: 185 mg/dL — ABNORMAL HIGH (ref 70–99)
Glucose-Capillary: 170 mg/dL — ABNORMAL HIGH (ref 70–99)
Glucose-Capillary: 176 mg/dL — ABNORMAL HIGH (ref 70–99)

## 2021-06-12 LAB — MAGNESIUM: Magnesium: 2.5 mg/dL — ABNORMAL HIGH (ref 1.7–2.4)

## 2021-06-12 LAB — BASIC METABOLIC PANEL
Anion gap: 5 (ref 5–15)
BUN: 46 mg/dL — ABNORMAL HIGH (ref 8–23)
CO2: 27 mmol/L (ref 22–32)
Calcium: 8.6 mg/dL — ABNORMAL LOW (ref 8.9–10.3)
Chloride: 106 mmol/L (ref 98–111)
Creatinine, Ser: 3.15 mg/dL — ABNORMAL HIGH (ref 0.44–1.00)
GFR, Estimated: 16 mL/min — ABNORMAL LOW (ref >60)
Glucose, Bld: 50 mg/dL — ABNORMAL LOW (ref 70–99)
Potassium: 5.6 mmol/L — ABNORMAL HIGH (ref 3.5–5.1)
Sodium: 138 mmol/L (ref 135–145)

## 2021-06-12 MED ORDER — FUROSEMIDE 10 MG/ML IJ SOLN: 40.0000 mg | Freq: Every day | INTRAMUSCULAR | Status: DC

## 2021-06-12 MED ORDER — INSULIN ASPART 100 UNIT/ML IJ SOLN
0.0000 [IU] | Freq: Three times a day (TID) | INTRAMUSCULAR | Status: DC
Start: 2021-06-12 — End: 2021-06-15
  Administered 2021-06-12 (×3): 3 [IU] via SUBCUTANEOUS
  Administered 2021-06-13: 2 [IU] via SUBCUTANEOUS
  Administered 2021-06-13: 5 [IU] via SUBCUTANEOUS
  Administered 2021-06-14 (×2): 2 [IU] via SUBCUTANEOUS
  Administered 2021-06-14 – 2021-06-15 (×2): 3 [IU] via SUBCUTANEOUS

## 2021-06-12 NOTE — Progress Notes (Signed)
Hypoglycemic Event  CBG: 48  Treatment: Orange juice and Ensure  Symptoms: None  Follow-up CBG: Time:0809 CBG Result: 86  Possible Reasons for Event: decrease oral intake   Comments/MD notified:MD made aware    Zadie Rhine

## 2021-06-12 NOTE — Plan of Care (Signed)

## 2021-06-12 NOTE — Progress Notes (Signed)
Progress Note    Nancy [name]   [id number]:[id number]  DOB: 06-24-1961  DOA: 06/10/2021     2  PCP: [name], MD  CC: SOB, swelling  Hospital Course: Nancy [name] is a 60 y.o. female with medical history significant of DMII, HTN, HFpEF, HLD. Presenting with BLE swelling and dyspnea. She notes chronic BLE swelling.  However, 2 days PTA she noted increased swelling that was making it difficult to stand. She weighed herself and reports a 14 lbs increase over that time. She began having difficulty lying flat, and she has had to sleep in an upright position during that time. She has also noted that she had become more short of breath with movement. She continued to take her normal lasix; but did not try any different treatments. She decided to come to the ED for help. She denies any other aggravating or alleviating factors.   On work-up in the ER she was noted to have significant lower extremity edema, elevated BNP (511), and CXR was notable for interstitial edema. She was started on IV Lasix and admitted for further work-up and treatment.   Interval History:  No events overnight. Breathing is still improved but still has significant LE edema. Discussed we'd hold lasix today and resume tomorrow to see response in renal function.   ROS: Constitutional: negative for chills and fevers, Respiratory: positive for dyspnea on exertion, negative for cough and sputum, Cardiovascular: negative for chest pain, and Gastrointestinal: negative for abdominal pain  Assessment & Plan: * Acute diastolic CHF (congestive heart failure) ([place]) - last echo 04/05/21: EF 60-65%, Gr II DD - BNP 511, CXR consistent with pulm edema; she has tense 3+ B/L LE edema - continue IV lasix; hold today due to bump in creatinine, resume tomorrow if renal function better - strict I&O -Continue aspirin, Lopressor  CKD (chronic kidney disease), stage IV ([place]) - patient has history of CKD4. Baseline creat ~  2.3-2.5, eGFR 21-25 -at baseline on admission (mild increase today, holding lasix 1 day to see if improves).  Monitor renal function while on IV diuresis  History of cerebrovascular accident (CVA) with residual deficit - Continue aspirin - Patient has declined statin in the past  Gastroesophageal reflux disease without esophagitis - will treat if symptomatic  Hypertension - Continue amlodipine and Lopressor and will treat further if needed  Type II diabetes mellitus with neurological manifestations, uncontrolled ([place]) - Continue SSI and CBG monitoring   Old records reviewed in assessment of this patient  Antimicrobials:   DVT prophylaxis: heparin injection 5,000 Units Start: 06/10/21 2200   Code Status:   Code Status: Full Code Family Communication:   Disposition Plan: Status is: Inpatient  Remains inpatient appropriate because:IV treatments appropriate due to intensity of illness or inability to take PO and Inpatient level of care appropriate due to severity of illness  Dispo: The patient is from: Home              Anticipated d/c is to: Home              Patient currently is not medically stable to d/c.   Difficult to place patient No  Risk of unplanned readmission score: Unplanned Admission- Pilot do not use: 25.77   Objective: Blood pressure (!) 143/71, pulse 67, temperature 97.7 F (36.5 C), temperature source Oral, resp. rate 18, height $RemoveBe'5\' 3"'CvIzMSnmM$  (1.6 m), weight 106 kg, SpO2 100 %.  Examination: General appearance: alert, cooperative, and no distress Head: Normocephalic, without obvious  abnormality, atraumatic Eyes:  EOMI Lungs:  Scattered coarse breath sounds with some crackles, no wheezing Heart: regular rate and rhythm and S1, S2 normal Abdomen: normal findings: bowel sounds normal and soft, non-tender Extremities:  Tense 3+ bilateral lower extremity pitting edema up to knees Skin: mobility and turgor normal Neurologic: Grossly normal  Consultants:     Procedures:    Data Reviewed: I have personally reviewed following labs and imaging studies Results for orders placed or performed during the hospital encounter of 06/10/21 (from the past 24 hour(s))  Glucose, capillary     Status: Abnormal   Collection Time: 06/11/21  4:43 PM  Result Value Ref Range   Glucose-Capillary 118 (H) 70 - 99 mg/dL  Glucose, capillary     Status: Abnormal   Collection Time: 06/11/21  9:24 PM  Result Value Ref Range   Glucose-Capillary 103 (H) 70 - 99 mg/dL  Basic metabolic panel     Status: Abnormal   Collection Time: 06/12/21  3:27 AM  Result Value Ref Range   Sodium 138 135 - 145 mmol/L   Potassium 5.6 (H) 3.5 - 5.1 mmol/L   Chloride 106 98 - 111 mmol/L   CO2 27 22 - 32 mmol/L   Glucose, Bld 50 (L) 70 - 99 mg/dL   BUN 46 (H) 8 - 23 mg/dL   Creatinine, Ser 3.15 (H) 0.44 - 1.00 mg/dL   Calcium 8.6 (L) 8.9 - 10.3 mg/dL   GFR, Estimated 16 (L) >60 mL/min   Anion gap 5 5 - 15  CBC with Differential/Platelet     Status: Abnormal   Collection Time: 06/12/21  3:27 AM  Result Value Ref Range   WBC 5.7 4.0 - 10.5 K/uL   RBC 2.91 (L) 3.87 - 5.11 MIL/uL   Hemoglobin 8.3 (L) 12.0 - 15.0 g/dL   HCT 28.2 (L) 36.0 - 46.0 %   MCV 96.9 80.0 - 100.0 fL   MCH 28.5 26.0 - 34.0 pg   MCHC 29.4 (L) 30.0 - 36.0 g/dL   RDW 14.1 11.5 - 15.5 %   Platelets 231 150 - 400 K/uL   nRBC 0.0 0.0 - 0.2 %   Neutrophils Relative % 51 %   Neutro Abs 2.8 1.7 - 7.7 K/uL   Lymphocytes Relative 31 %   Lymphs Abs 1.8 0.7 - 4.0 K/uL   Monocytes Relative 12 %   Monocytes Absolute 0.7 0.1 - 1.0 K/uL   Eosinophils Relative 6 %   Eosinophils Absolute 0.4 0.0 - 0.5 K/uL   Basophils Relative 0 %   Basophils Absolute 0.0 0.0 - 0.1 K/uL   Immature Granulocytes 0 %   Abs Immature Granulocytes 0.02 0.00 - 0.07 K/uL  Magnesium     Status: Abnormal   Collection Time: 06/12/21  3:27 AM  Result Value Ref Range   Magnesium 2.5 (H) 1.7 - 2.4 mg/dL  Glucose, capillary     Status: Abnormal    Collection Time: 06/12/21  7:36 AM  Result Value Ref Range   Glucose-Capillary 43 (LL) 70 - 99 mg/dL   Comment 1 Notify RN   Glucose, capillary     Status: None   Collection Time: 06/12/21  8:09 AM  Result Value Ref Range   Glucose-Capillary 86 70 - 99 mg/dL  Glucose, capillary     Status: Abnormal   Collection Time: 06/12/21 11:41 AM  Result Value Ref Range   Glucose-Capillary 185 (H) 70 - 99 mg/dL    Recent Results (from the past  240 hour(s))  SARS CORONAVIRUS 2 (TAT 6-24 HRS) Nasopharyngeal Nasopharyngeal Swab     Status: None   Collection Time: 06/10/21  2:54 PM   Specimen: Nasopharyngeal Swab  Result Value Ref Range Status   SARS Coronavirus 2 NEGATIVE NEGATIVE Final    Comment: (NOTE) SARS-CoV-2 target nucleic acids are NOT DETECTED.  The SARS-CoV-2 RNA is generally detectable in upper and lower respiratory specimens during the acute phase of infection. Negative results do not preclude SARS-CoV-2 infection, do not rule out co-infections with other pathogens, and should not be used as the sole basis for treatment or other patient management decisions. Negative results must be combined with clinical observations, patient history, and epidemiological information. The expected result is Negative.  Fact Sheet for Patients: SugarRoll.be  Fact Sheet for Healthcare Providers: https://www.woods-mathews.com/  This test is not yet approved or cleared by the [place] FDA and  has been authorized for detection and/or diagnosis of SARS-CoV-2 by FDA under an Emergency Use Authorization (EUA). This EUA will remain  in effect (meaning this test can be used) for the duration of the COVID-19 declaration under Se ction 564(b)(1) of the Act, 21 U.S.C. section 360bbb-3(b)(1), unless the authorization is terminated or revoked sooner.  Performed at [facility], [place] [address], [place], [place] [address]      Radiology  Studies: ECHOCARDIOGRAM COMPLETE  Result Date: 06/11/2021    ECHOCARDIOGRAM REPORT   Patient Name:   KANDAS OLIVETO Date of Exam: 06/11/2021 Medical Rec #:  [id number]           Height:       63.0 in Accession #:    [id number]          Weight:       235.4 lb Date of Birth:  1961/01/26           BSA:          2.073 m Patient Age:    15 years            BP:           140/82 mmHg Patient Gender: F                   HR:           72 bpm. Exam Location:  Inpatient Procedure: 2D Echo, Cardiac Doppler and Color Doppler Indications:    I50.23 Acute on chronic systolic (congestive) heart failure  History:        Patient has prior history of Echocardiogram examinations, most                 recent 04/05/2021. Stroke; Risk Factors:Hypertension, Diabetes                 and Dyslipidemia.  Sonographer:    Tiffany Dance Referring Phys: [id number] [place]  1. Left ventricular ejection fraction, by estimation, is 55 to 60%. The left ventricle has normal function. The left ventricle has no regional wall motion abnormalities. There is moderate left ventricular hypertrophy. Left ventricular diastolic parameters are indeterminate.  2. Right ventricular systolic function is normal. The right ventricular size is mildly enlarged. There is mildly elevated pulmonary artery systolic pressure. The estimated right ventricular systolic pressure is 88.9 mmHg.  3. The mitral valve is normal in structure. No evidence of mitral valve regurgitation. No evidence of mitral stenosis.  4. Tricuspid valve regurgitation is mild to moderate.  5. The aortic valve is tricuspid. Aortic valve  regurgitation is not visualized. No aortic stenosis is present.  6. The inferior vena cava is dilated in size with <50% respiratory variability, suggesting right atrial pressure of 15 mmHg. FINDINGS  Left Ventricle: Left ventricular ejection fraction, by estimation, is 55 to 60%. The left ventricle has normal function. The left ventricle has no  regional wall motion abnormalities. The left ventricular internal cavity size was normal in size. There is  moderate left ventricular hypertrophy. Left ventricular diastolic parameters are indeterminate. Right Ventricle: The right ventricular size is mildly enlarged. Right vetricular wall thickness was not well visualized. Right ventricular systolic function is normal. There is mildly elevated pulmonary artery systolic pressure. The tricuspid regurgitant  velocity is 2.31 m/s, and with an assumed right atrial pressure of 15 mmHg, the estimated right ventricular systolic pressure is 36.3 mmHg. Left Atrium: Left atrial size was normal in size. Right Atrium: Right atrial size was normal in size. Pericardium: Trivial pericardial effusion is present. Mitral Valve: The mitral valve is normal in structure. No evidence of mitral valve regurgitation. No evidence of mitral valve stenosis. Tricuspid Valve: The tricuspid valve is normal in structure. Tricuspid valve regurgitation is mild to moderate. Aortic Valve: The aortic valve is tricuspid. Aortic valve regurgitation is not visualized. No aortic stenosis is present. Pulmonic Valve: The pulmonic valve was not well visualized. Pulmonic valve regurgitation is not visualized. Aorta: The aortic root and ascending aorta are structurally normal, with no evidence of dilitation. Venous: The inferior vena cava is dilated in size with less than 50% respiratory variability, suggesting right atrial pressure of 15 mmHg. IAS/Shunts: The interatrial septum was not well visualized.  LEFT VENTRICLE PLAX 2D LVIDd:         4.10 cm  Diastology LVIDs:         2.60 cm  LV e' medial:    4.46 cm/s LV PW:         1.40 cm  LV E/e' medial:  25.8 LV IVS:        1.00 cm  LV e' lateral:   5.87 cm/s LVOT diam:     1.80 cm  LV E/e' lateral: 19.6 LV SV:         43 LV SV Index:   21 LVOT Area:     2.54 cm  RIGHT VENTRICLE            IVC RV Basal diam:  3.20 cm    IVC diam: 2.10 cm RV Mid diam:    2.30 cm RV  S prime:     9.14 cm/s TAPSE (M-mode): 1.8 cm LEFT ATRIUM             Index       RIGHT ATRIUM           Index LA diam:        4.20 cm 2.03 cm/m  RA Area:     16.90 cm LA Vol (A2C):   46.6 ml 22.48 ml/m RA Volume:   47.90 ml  23.11 ml/m LA Vol (A4C):   40.4 ml 19.49 ml/m LA Biplane Vol: 47.0 ml 22.68 ml/m  AORTIC VALVE LVOT Vmax:   82.00 cm/s LVOT Vmean:  53.600 cm/s LVOT VTI:    0.168 m  AORTA Ao Root diam: 2.20 cm Ao Asc diam:  2.50 cm MITRAL VALVE                TRICUSPID VALVE MV Area (PHT): 4.21 cm     TR Peak grad:  21.3 mmHg MV Decel Time: 180 msec     TR Vmax:        231.00 cm/s MV E velocity: 115.00 cm/s MV A velocity: 70.40 cm/s   SHUNTS MV E/A ratio:  1.63         Systemic VTI:  0.17 m                             Systemic Diam: 1.80 cm Oswaldo Milian MD Electronically signed by Oswaldo Milian MD Signature Date/Time: 06/11/2021/3:56:17 PM    Final    DG Chest 2 View  Final Result      Scheduled Meds:  amLODipine  2.5 mg Oral Daily   aspirin EC  81 mg Oral Daily   docusate sodium  100 mg Oral BID   DULoxetine  20 mg Oral Daily   [START ON 06/13/2021] furosemide  40 mg Intravenous Daily   heparin  5,000 Units Subcutaneous Q8H   insulin aspart  0-15 Units Subcutaneous TID AC & HS   metoprolol tartrate  50 mg Oral BID   mirtazapine  15 mg Oral QHS   polyethylene glycol  17 g Oral Daily   risperiDONE  3 mg Oral QHS   PRN Meds: acetaminophen **OR** acetaminophen, albuterol, metoprolol tartrate Continuous Infusions:   LOS: 2 days  Time spent: Greater than 50% of the 35 minute visit was spent in counseling/coordination of care for the patient as laid out in the A&P.   Dwyane Dee, MD [facility] 06/12/2021, 2:54 PM

## 2021-06-13 LAB — CBC WITH DIFFERENTIAL/PLATELET
Abs Immature Granulocytes: 0.02 10*3/uL (ref 0.00–0.07)
Basophils Absolute: 0 10*3/uL (ref 0.0–0.1)
Basophils Relative: 0 %
Eosinophils Absolute: 0.4 10*3/uL (ref 0.0–0.5)
Eosinophils Relative: 6 %
HCT: 26.1 % — ABNORMAL LOW (ref 36.0–46.0)
Hemoglobin: 7.7 g/dL — ABNORMAL LOW (ref 12.0–15.0)
Immature Granulocytes: 0 %
Lymphocytes Relative: 26 %
Lymphs Abs: 1.8 10*3/uL (ref 0.7–4.0)
MCH: 28.5 pg (ref 26.0–34.0)
MCHC: 29.5 g/dL — ABNORMAL LOW (ref 30.0–36.0)
MCV: 96.7 fL (ref 80.0–100.0)
Monocytes Absolute: 0.8 10*3/uL (ref 0.1–1.0)
Monocytes Relative: 11 %
Neutro Abs: 4.1 10*3/uL (ref 1.7–7.7)
Neutrophils Relative %: 57 %
Platelets: 218 10*3/uL (ref 150–400)
RBC: 2.7 MIL/uL — ABNORMAL LOW (ref 3.87–5.11)
RDW: 14.5 % (ref 11.5–15.5)
WBC: 7.1 10*3/uL (ref 4.0–10.5)
nRBC: 0 % (ref 0.0–0.2)

## 2021-06-13 LAB — GLUCOSE, CAPILLARY
Glucose-Capillary: 125 mg/dL — ABNORMAL HIGH (ref 70–99)
Glucose-Capillary: 154 mg/dL — ABNORMAL HIGH (ref 70–99)
Glucose-Capillary: 167 mg/dL — ABNORMAL HIGH (ref 70–99)
Glucose-Capillary: 245 mg/dL — ABNORMAL HIGH (ref 70–99)
Glucose-Capillary: 83 mg/dL (ref 70–99)

## 2021-06-13 LAB — BASIC METABOLIC PANEL
Anion gap: 7 (ref 5–15)
BUN: 55 mg/dL — ABNORMAL HIGH (ref 8–23)
CO2: 25 mmol/L (ref 22–32)
Calcium: 8.2 mg/dL — ABNORMAL LOW (ref 8.9–10.3)
Chloride: 106 mmol/L (ref 98–111)
Creatinine, Ser: 3.11 mg/dL — ABNORMAL HIGH (ref 0.44–1.00)
GFR, Estimated: 16 mL/min — ABNORMAL LOW (ref >60)
Glucose, Bld: 145 mg/dL — ABNORMAL HIGH (ref 70–99)
Potassium: 5.5 mmol/L — ABNORMAL HIGH (ref 3.5–5.1)
Sodium: 138 mmol/L (ref 135–145)

## 2021-06-13 LAB — MAGNESIUM: Magnesium: 2.7 mg/dL — ABNORMAL HIGH (ref 1.7–2.4)

## 2021-06-13 MED ORDER — SODIUM ZIRCONIUM CYCLOSILICATE 10 G PO PACK
10.0000 g | PACK | Freq: Once | ORAL | Status: AC
  Administered 2021-06-13: 10 g via ORAL
  Filled 2021-06-13: qty 1

## 2021-06-13 MED ORDER — TORSEMIDE 20 MG PO TABS
40.0000 mg | ORAL_TABLET | Freq: Every day | ORAL | Status: DC
  Administered 2021-06-13 – 2021-06-15 (×3): 40 mg via ORAL
  Filled 2021-06-13 (×3): qty 2

## 2021-06-13 NOTE — Plan of Care (Signed)

## 2021-06-13 NOTE — Plan of Care (Signed)
  Problem: Education: Goal: Knowledge of General Education information will improve Description: Including pain rating scale, medication(s)/side effects and non-pharmacologic comfort measures 06/13/2021 1453 by Zadie Rhine, RN Outcome: Progressing 06/13/2021 1352 by Zadie Rhine, RN Outcome: Progressing   Problem: Health Behavior/Discharge Planning: Goal: Ability to manage health-related needs will improve 06/13/2021 1453 by Zadie Rhine, RN Outcome: Progressing 06/13/2021 1352 by Zadie Rhine, RN Outcome: Progressing   Problem: Clinical Measurements: Goal: Ability to maintain clinical measurements within normal limits will improve Outcome: Progressing Goal: Will remain free from infection Outcome: Progressing

## 2021-06-13 NOTE — Progress Notes (Addendum)
Progress Note    CEOLA PARA   MOQ:[id number]  DOB: 08-05-1961  DOA: 06/10/2021     3  PCP: [name], MD  CC: SOB, swelling  Hospital Course: Nancy [name] is a 60 y.o. female with medical history significant of DMII, HTN, HFpEF, HLD. Presenting with BLE swelling and dyspnea. She notes chronic BLE swelling.  However, 2 days PTA she noted increased swelling that was making it difficult to stand. She weighed herself and reports a 14 lbs increase over that time. She began having difficulty lying flat, and she has had to sleep in an upright position during that time. She has also noted that she had become more short of breath with movement. She continued to take her normal lasix; but did not try any different treatments. She decided to come to the ED for help. She denies any other aggravating or alleviating factors.   On work-up in the ER she was noted to have significant lower extremity edema, elevated BNP (511), and CXR was notable for interstitial edema. She was started on IV Lasix and admitted for further work-up and treatment.   Interval History:  Patient was seen and examined today.  No new complaints.  Denies any chest pain or shortness of breath.  Assessment & Plan: Old records reviewed in assessment of this patient  Acute diastolic CHF (congestive heart failure) ([place]) - last echo 04/05/21: EF 60-65%, Gr II DD - BNP 511, CXR consistent with pulm edema; she has tense 3+ B/L LE edema, which started improving. -Switch IV Lasix with torsemide at 40 mg daily. - strict I&O -Continue aspirin, Lopressor  Hyperkalemia.  Potassium at 5.5. -Give her Lokelma -Monitor potassium   CKD (chronic kidney disease), stage IV ([place]) - patient has history of CKD4. Baseline creat ~ 2.3-2.5, eGFR 21-25 -Increase in creatinine with IV diuresis, it was held yesterday. -Monitor renal function.  history of cerebrovascular accident (CVA) with residual deficit - Continue  aspirin - Patient has declined statin in the past   Gastroesophageal reflux disease without esophagitis - will treat if symptomatic   Hypertension - Continue amlodipine and Lopressor and will treat further if needed   Type II diabetes mellitus with neurological manifestations, uncontrolled ([place]) - Continue SSI and CBG monitoring   Antimicrobials:  DVT prophylaxis: heparin injection 5,000 Units Start: 06/10/21 2200   Code Status:   Code Status: Full Code Family Communication: Discussed with patient  Disposition Plan: Status is: Inpatient  Remains inpatient appropriate because:IV treatments appropriate due to intensity of illness or inability to take PO and Inpatient level of care appropriate due to severity of illness  Dispo: The patient is from: Home              Anticipated d/c is to: Home              Patient currently is not medically stable to d/c.   Difficult to place patient No  Risk of unplanned readmission score: Unplanned Admission- Pilot do not use: 26.17   Objective: Blood pressure 134/87, pulse 67, temperature 98.5 F (36.9 C), temperature source Oral, resp. rate 18, height 5' 3" (1.6 m), weight 109.6 kg, SpO2 96 %.  Examination: General.  Well-developed lady, in no acute distress. Pulmonary.  Lungs clear bilaterally, normal respiratory effort. CV.  Regular rate and rhythm, no JVD, rub or murmur. Abdomen.  Soft, nontender, nondistended, BS positive. CNS.  Alert and oriented .  No focal neurologic deficit. Extremities.  2+ LE edema, no  cyanosis, pulses intact and symmetrical. Psychiatry.  Judgment and insight appears normal.   Consultants:    Procedures:    Data Reviewed: I have personally reviewed following labs and imaging studies Results for orders placed or performed during the hospital encounter of 06/10/21 (from the past 24 hour(s))  Glucose, capillary     Status: Abnormal   Collection Time: 06/12/21  5:05 PM  Result Value Ref Range    Glucose-Capillary 170 (H) 70 - 99 mg/dL  Glucose, capillary     Status: Abnormal   Collection Time: 06/12/21  8:43 PM  Result Value Ref Range   Glucose-Capillary 176 (H) 70 - 99 mg/dL  Glucose, capillary     Status: Abnormal   Collection Time: 06/13/21 12:47 AM  Result Value Ref Range   Glucose-Capillary 167 (H) 70 - 99 mg/dL  Basic metabolic panel     Status: Abnormal   Collection Time: 06/13/21  3:21 AM  Result Value Ref Range   Sodium 138 135 - 145 mmol/L   Potassium 5.5 (H) 3.5 - 5.1 mmol/L   Chloride 106 98 - 111 mmol/L   CO2 25 22 - 32 mmol/L   Glucose, Bld 145 (H) 70 - 99 mg/dL   BUN 55 (H) 8 - 23 mg/dL   Creatinine, Ser 3.11 (H) 0.44 - 1.00 mg/dL   Calcium 8.2 (L) 8.9 - 10.3 mg/dL   GFR, Estimated 16 (L) >60 mL/min   Anion gap 7 5 - 15  CBC with Differential/Platelet     Status: Abnormal   Collection Time: 06/13/21  3:21 AM  Result Value Ref Range   WBC 7.1 4.0 - 10.5 K/uL   RBC 2.70 (L) 3.87 - 5.11 MIL/uL   Hemoglobin 7.7 (L) 12.0 - 15.0 g/dL   HCT 26.1 (L) 36.0 - 46.0 %   MCV 96.7 80.0 - 100.0 fL   MCH 28.5 26.0 - 34.0 pg   MCHC 29.5 (L) 30.0 - 36.0 g/dL   RDW 14.5 11.5 - 15.5 %   Platelets 218 150 - 400 K/uL   nRBC 0.0 0.0 - 0.2 %   Neutrophils Relative % 57 %   Neutro Abs 4.1 1.7 - 7.7 K/uL   Lymphocytes Relative 26 %   Lymphs Abs 1.8 0.7 - 4.0 K/uL   Monocytes Relative 11 %   Monocytes Absolute 0.8 0.1 - 1.0 K/uL   Eosinophils Relative 6 %   Eosinophils Absolute 0.4 0.0 - 0.5 K/uL   Basophils Relative 0 %   Basophils Absolute 0.0 0.0 - 0.1 K/uL   Immature Granulocytes 0 %   Abs Immature Granulocytes 0.02 0.00 - 0.07 K/uL  Magnesium     Status: Abnormal   Collection Time: 06/13/21  3:21 AM  Result Value Ref Range   Magnesium 2.7 (H) 1.7 - 2.4 mg/dL  Glucose, capillary     Status: None   Collection Time: 06/13/21  8:03 AM  Result Value Ref Range   Glucose-Capillary 83 70 - 99 mg/dL  Glucose, capillary     Status: Abnormal   Collection Time: 06/13/21  12:04 PM  Result Value Ref Range   Glucose-Capillary 125 (H) 70 - 99 mg/dL    Recent Results (from the past 240 hour(s))  SARS CORONAVIRUS 2 (TAT 6-24 HRS) Nasopharyngeal Nasopharyngeal Swab     Status: None   Collection Time: 06/10/21  2:54 PM   Specimen: Nasopharyngeal Swab  Result Value Ref Range Status   SARS Coronavirus 2 NEGATIVE NEGATIVE Final    Comment: (  NOTE) SARS-CoV-2 target nucleic acids are NOT DETECTED.  The SARS-CoV-2 RNA is generally detectable in upper and lower respiratory specimens during the acute phase of infection. Negative results do not preclude SARS-CoV-2 infection, do not rule out co-infections with other pathogens, and should not be used as the sole basis for treatment or other patient management decisions. Negative results must be combined with clinical observations, patient history, and epidemiological information. The expected result is Negative.  Fact Sheet for Patients: SugarRoll.be  Fact Sheet for Healthcare Providers: https://www.woods-mathews.com/  This test is not yet approved or cleared by the [place] FDA and  has been authorized for detection and/or diagnosis of SARS-CoV-2 by FDA under an Emergency Use Authorization (EUA). This EUA will remain  in effect (meaning this test can be used) for the duration of the COVID-19 declaration under Se ction 564(b)(1) of the Act, 21 U.S.C. section 360bbb-3(b)(1), unless the authorization is terminated or revoked sooner.  Performed at [facility], [place] [address] [place], [place] [address]      Radiology Studies: No results found. DG Chest 2 View  Final Result      Scheduled Meds:  amLODipine  2.5 mg Oral Daily   aspirin EC  81 mg Oral Daily   docusate sodium  100 mg Oral BID   DULoxetine  20 mg Oral Daily   heparin  5,000 Units Subcutaneous Q8H   insulin aspart  0-15 Units Subcutaneous TID AC & HS   metoprolol tartrate  50 mg Oral BID    mirtazapine  15 mg Oral QHS   risperiDONE  3 mg Oral QHS   torsemide  40 mg Oral Daily   PRN Meds: acetaminophen **OR** acetaminophen, albuterol, metoprolol tartrate Continuous Infusions:   LOS: 3 days  Time spent: Greater than 50% of the 35 minute visit was spent in counseling/coordination of care for the patient as laid out in the A&P.   Lorella Nimrod, MD [facility] 06/13/2021, 4:49 PM

## 2021-06-14 ENCOUNTER — Other Ambulatory Visit: Payer: Self-pay

## 2021-06-14 LAB — CBC WITH DIFFERENTIAL/PLATELET
Abs Immature Granulocytes: 0.02 10*3/uL (ref 0.00–0.07)
Basophils Absolute: 0 10*3/uL (ref 0.0–0.1)
Basophils Relative: 0 %
Eosinophils Absolute: 0.5 10*3/uL (ref 0.0–0.5)
Eosinophils Relative: 7 %
HCT: 26.5 % — ABNORMAL LOW (ref 36.0–46.0)
Hemoglobin: 7.9 g/dL — ABNORMAL LOW (ref 12.0–15.0)
Immature Granulocytes: 0 %
Lymphocytes Relative: 33 %
Lymphs Abs: 2.3 10*3/uL (ref 0.7–4.0)
MCH: 28.1 pg (ref 26.0–34.0)
MCHC: 29.8 g/dL — ABNORMAL LOW (ref 30.0–36.0)
MCV: 94.3 fL (ref 80.0–100.0)
Monocytes Absolute: 0.8 10*3/uL (ref 0.1–1.0)
Monocytes Relative: 11 %
Neutro Abs: 3.5 10*3/uL (ref 1.7–7.7)
Neutrophils Relative %: 49 %
Platelets: 182 10*3/uL (ref 150–400)
RBC: 2.81 MIL/uL — ABNORMAL LOW (ref 3.87–5.11)
RDW: 14.8 % (ref 11.5–15.5)
WBC: 7.2 10*3/uL (ref 4.0–10.5)
nRBC: 0 % (ref 0.0–0.2)

## 2021-06-14 LAB — GLUCOSE, CAPILLARY
Glucose-Capillary: 104 mg/dL — ABNORMAL HIGH (ref 70–99)
Glucose-Capillary: 154 mg/dL — ABNORMAL HIGH (ref 70–99)
Glucose-Capillary: 130 mg/dL — ABNORMAL HIGH (ref 70–99)
Glucose-Capillary: 134 mg/dL — ABNORMAL HIGH (ref 70–99)

## 2021-06-14 LAB — POTASSIUM
Potassium: 5.9 mmol/L — ABNORMAL HIGH (ref 3.5–5.1)
Potassium: 5 mmol/L (ref 3.5–5.1)

## 2021-06-14 LAB — BASIC METABOLIC PANEL
Anion gap: 6 (ref 5–15)
BUN: 60 mg/dL — ABNORMAL HIGH (ref 8–23)
CO2: 26 mmol/L (ref 22–32)
Calcium: 8.3 mg/dL — ABNORMAL LOW (ref 8.9–10.3)
Chloride: 107 mmol/L (ref 98–111)
Creatinine, Ser: 2.93 mg/dL — ABNORMAL HIGH (ref 0.44–1.00)
GFR, Estimated: 18 mL/min — ABNORMAL LOW (ref >60)
Glucose, Bld: 113 mg/dL — ABNORMAL HIGH (ref 70–99)
Potassium: 5.7 mmol/L — ABNORMAL HIGH (ref 3.5–5.1)
Sodium: 139 mmol/L (ref 135–145)

## 2021-06-14 LAB — MAGNESIUM: Magnesium: 2.7 mg/dL — ABNORMAL HIGH (ref 1.7–2.4)

## 2021-06-14 MED ORDER — SODIUM ZIRCONIUM CYCLOSILICATE 10 G PO PACK
10.0000 g | PACK | Freq: Every day | ORAL | Status: AC
  Administered 2021-06-14 – 2021-06-15 (×2): 10 g via ORAL
  Filled 2021-06-14 (×2): qty 1

## 2021-06-14 MED ORDER — SODIUM POLYSTYRENE SULFONATE 15 GM/60ML PO SUSP: 60.0000 g | Freq: Once | ORAL | Status: DC

## 2021-06-14 MED ORDER — DEXTROSE 50 % IV SOLN
1.0000 | Freq: Once | INTRAVENOUS | Status: AC
  Administered 2021-06-14: 50 mL via INTRAVENOUS
  Filled 2021-06-14: qty 50

## 2021-06-14 MED ORDER — SODIUM POLYSTYRENE SULFONATE 15 GM/60ML PO SUSP
60.0000 g | Freq: Four times a day (QID) | ORAL | Status: AC
  Administered 2021-06-14 (×2): 60 g via ORAL
  Filled 2021-06-14 (×2): qty 240

## 2021-06-14 MED ORDER — SODIUM ZIRCONIUM CYCLOSILICATE 10 G PO PACK: 10.0000 g | PACK | Freq: Once | ORAL | Status: DC

## 2021-06-14 MED ORDER — INSULIN ASPART 100 UNIT/ML IV SOLN
10.0000 [IU] | Freq: Once | INTRAVENOUS | Status: AC
  Administered 2021-06-14: 10 [IU] via INTRAVENOUS

## 2021-06-14 MED ORDER — CALCIUM GLUCONATE-NACL 1-0.675 GM/50ML-% IV SOLN
1.0000 g | Freq: Once | INTRAVENOUS | Status: AC
  Administered 2021-06-14: 1000 mg via INTRAVENOUS
  Filled 2021-06-14: qty 50

## 2021-06-14 NOTE — Plan of Care (Signed)

## 2021-06-14 NOTE — Progress Notes (Signed)
Pt with increase K+ Md updated and orders received and complete. Pt asymptomatic, a/o denies chest pain. 12 lead EKG completed as ordered. Pt tele and cont O2 sating on RA 98%. VS stable. Will cont to monitor. SRP, RN

## 2021-06-14 NOTE — Progress Notes (Signed)
   06/14/21 1426  Mobility  Activity Ambulated in hall  Level of Assistance Contact guard assist, steadying assist  Assistive Device Front wheel walker  Distance Ambulated (ft) 40 ft  Mobility Ambulated with assistance in hallway  Mobility Response Tolerated well  Mobility performed by Mobility specialist  $Mobility charge 1 Mobility    Pre-mobility: 154/72 BP, 98 SpO2 During mobility: 99 SpO2 Post-mobility: 100 SPO2  Pt usually ambulates with cane, but when standing pt reached for assistance. Transferred to RW before ambulation. Pt then ambulated 40 ft in hallway with contact guard assist. Pt needed 1 standing rest break during ambulation and signs of SOB present at end of session. Overall SpO2 maintained in upper 90s throughout session. Pt left in bed with call bell at side and bed alarm on per nurse and pt request.  Ralene Muskrat Mobility Specialist Acute Rehabilitation Services Office: [phone] 06/14/21, 2:35 PM

## 2021-06-14 NOTE — Progress Notes (Signed)
Results for TRELLIS, GUIRGUIS (MRN [id number]) as of 06/14/2021 17:33  Ref. Range 06/14/2021 15:49  Potassium Latest Ref Range: 3.5 - 5.1 mmol/L 5.9 (H)     MD made aware, RN

## 2021-06-14 NOTE — Progress Notes (Signed)
Progress Note    Nancy [name]   QQP:[id number]  DOB: 08-12-1961  DOA: 06/10/2021     4  PCP: [name], MD  CC: SOB, swelling  Hospital Course: Nancy [name] is a 60 y.o. female with medical history significant of DMII, HTN, HFpEF, HLD. Presenting with BLE swelling and dyspnea. She notes chronic BLE swelling.  However, 2 days PTA she noted increased swelling that was making it difficult to stand. She weighed herself and reports a 14 lbs increase over that time. She began having difficulty lying flat, and she has had to sleep in an upright position during that time. She has also noted that she had become more short of breath with movement. She continued to take her normal lasix; but did not try any different treatments. She decided to come to the ED for help. She denies any other aggravating or alleviating factors.   On work-up in the ER she was noted to have significant lower extremity edema, elevated BNP (511), and CXR was notable for interstitial edema. She was started on IV Lasix and admitted for further work-up and treatment.   Worsening hyperkalemia despite Lokelma yesterday.  Interval History:  Patient was seen and examined today.  No new complaint.  Patient to  Assessment & Plan: Old records reviewed in assessment of this patient  Acute diastolic CHF (congestive heart failure) ([place]) - last echo 04/05/21: EF 60-65%, Gr II DD - BNP 511, CXR consistent with pulm edema; she has tense 3+ B/L LE edema, which started improving. -Switched IV Lasix with torsemide at 40 mg daily. - strict I&O -Continue aspirin, Lopressor  Hyperkalemia.  Potassium at 5.7 despite getting 1 dose of Lokelma yesterday. -Repeat Lokelma -Monitor potassium   CKD (chronic kidney disease), stage IV ([place]) - patient has history of CKD4. Baseline creat ~ 2.3-2.5, eGFR 21-25 Creatinine improving and seems to be at baseline now. -Monitor renal function.  history of cerebrovascular  accident (CVA) with residual deficit - Continue aspirin - Patient has declined statin in the past   Gastroesophageal reflux disease without esophagitis - will treat if symptomatic   Hypertension - Continue amlodipine and Lopressor and will treat further if needed   Type II diabetes mellitus with neurological manifestations, uncontrolled ([place]) - Continue SSI and CBG monitoring   Antimicrobials:  DVT prophylaxis: heparin injection 5,000 Units Start: 06/10/21 2200   Code Status:   Code Status: Full Code Family Communication: Discussed with patient  Disposition Plan: Status is: Inpatient  Remains inpatient appropriate because:IV treatments appropriate due to intensity of illness or inability to take PO and Inpatient level of care appropriate due to severity of illness  Dispo: The patient is from: Home              Anticipated d/c is to: Home              Patient currently is not medically stable to d/c.   Difficult to place patient No  Risk of unplanned readmission score: Unplanned Admission- Pilot do not use: 26.75   Objective: Blood pressure (!) 154/72, pulse 67, temperature 98.5 F (36.9 C), temperature source Oral, resp. rate 16, height _0  (1.6 m), weight 109.4 kg, SpO2 100 %.  Examination:  General.  Well-developed lady, in no acute distress. Pulmonary.  Lungs clear bilaterally, normal respiratory effort. CV.  Regular rate and rhythm, no JVD, rub or murmur. Abdomen.  Soft, nontender, nondistended, BS positive. CNS.  Alert and oriented x3.  No focal neurologic  deficit. Extremities.  2+ LE edema, no cyanosis, pulses intact and symmetrical. Psychiatry.  Judgment and insight appears normal.   Consultants:    Procedures:    Data Reviewed: I have personally reviewed following labs and imaging studies Results for orders placed or performed during the hospital encounter of 06/10/21 (from the past 24 hour(s))  Glucose, capillary     Status: Abnormal   Collection  Time: 06/13/21  7:44 PM  Result Value Ref Range   Glucose-Capillary 245 (H) 70 - 99 mg/dL  Basic metabolic panel     Status: Abnormal   Collection Time: 06/14/21  3:55 AM  Result Value Ref Range   Sodium 139 135 - 145 mmol/L   Potassium 5.7 (H) 3.5 - 5.1 mmol/L   Chloride 107 98 - 111 mmol/L   CO2 26 22 - 32 mmol/L   Glucose, Bld 113 (H) 70 - 99 mg/dL   BUN 60 (H) 8 - 23 mg/dL   Creatinine, Ser 2.93 (H) 0.44 - 1.00 mg/dL   Calcium 8.3 (L) 8.9 - 10.3 mg/dL   GFR, Estimated 18 (L) >60 mL/min   Anion gap 6 5 - 15  CBC with Differential/Platelet     Status: Abnormal   Collection Time: 06/14/21  3:55 AM  Result Value Ref Range   WBC 7.2 4.0 - 10.5 K/uL   RBC 2.81 (L) 3.87 - 5.11 MIL/uL   Hemoglobin 7.9 (L) 12.0 - 15.0 g/dL   HCT 26.5 (L) 36.0 - 46.0 %   MCV 94.3 80.0 - 100.0 fL   MCH 28.1 26.0 - 34.0 pg   MCHC 29.8 (L) 30.0 - 36.0 g/dL   RDW 14.8 11.5 - 15.5 %   Platelets 182 150 - 400 K/uL   nRBC 0.0 0.0 - 0.2 %   Neutrophils Relative % 49 %   Neutro Abs 3.5 1.7 - 7.7 K/uL   Lymphocytes Relative 33 %   Lymphs Abs 2.3 0.7 - 4.0 K/uL   Monocytes Relative 11 %   Monocytes Absolute 0.8 0.1 - 1.0 K/uL   Eosinophils Relative 7 %   Eosinophils Absolute 0.5 0.0 - 0.5 K/uL   Basophils Relative 0 %   Basophils Absolute 0.0 0.0 - 0.1 K/uL   Immature Granulocytes 0 %   Abs Immature Granulocytes 0.02 0.00 - 0.07 K/uL  Magnesium     Status: Abnormal   Collection Time: 06/14/21  3:55 AM  Result Value Ref Range   Magnesium 2.7 (H) 1.7 - 2.4 mg/dL  Glucose, capillary     Status: Abnormal   Collection Time: 06/14/21  7:24 AM  Result Value Ref Range   Glucose-Capillary 104 (H) 70 - 99 mg/dL  Glucose, capillary     Status: Abnormal   Collection Time: 06/14/21 11:30 AM  Result Value Ref Range   Glucose-Capillary 154 (H) 70 - 99 mg/dL  Glucose, capillary     Status: Abnormal   Collection Time: 06/14/21  4:29 PM  Result Value Ref Range   Glucose-Capillary 130 (H) 70 - 99 mg/dL     Recent Results (from the past 240 hour(s))  SARS CORONAVIRUS 2 (TAT 6-24 HRS) Nasopharyngeal Nasopharyngeal Swab     Status: None   Collection Time: 06/10/21  2:54 PM   Specimen: Nasopharyngeal Swab  Result Value Ref Range Status   SARS Coronavirus 2 NEGATIVE NEGATIVE Final    Comment: (NOTE) SARS-CoV-2 target nucleic acids are NOT DETECTED.  The SARS-CoV-2 RNA is generally detectable in upper and lower respiratory specimens during  the acute phase of infection. Negative results do not preclude SARS-CoV-2 infection, do not rule out co-infections with other pathogens, and should not be used as the sole basis for treatment or other patient management decisions. Negative results must be combined with clinical observations, patient history, and epidemiological information. The expected result is Negative.  Fact Sheet for Patients: SugarRoll.be  Fact Sheet for Healthcare Providers: https://www.woods-mathews.com/  This test is not yet approved or cleared by the [place] FDA and  has been authorized for detection and/or diagnosis of SARS-CoV-2 by FDA under an Emergency Use Authorization (EUA). This EUA will remain  in effect (meaning this test can be used) for the duration of the COVID-19 declaration under Se ction 564(b)(1) of the Act, 21 U.S.C. section 360bbb-3(b)(1), unless the authorization is terminated or revoked sooner.  Performed at [facility], [place] [address] [place], [place] [address]      Radiology Studies: No results found. DG Chest 2 View  Final Result      Scheduled Meds:  amLODipine  2.5 mg Oral Daily   aspirin EC  81 mg Oral Daily   docusate sodium  100 mg Oral BID   DULoxetine  20 mg Oral Daily   heparin  5,000 Units Subcutaneous Q8H   insulin aspart  0-15 Units Subcutaneous TID AC & HS   metoprolol tartrate  50 mg Oral BID   mirtazapine  15 mg Oral QHS   risperiDONE  3 mg Oral QHS   sodium zirconium  cyclosilicate  10 g Oral Daily   torsemide  40 mg Oral Daily   PRN Meds: acetaminophen **OR** acetaminophen, albuterol, metoprolol tartrate Continuous Infusions:   LOS: 4 days  Time spent: Greater than 50% of the 33 minute visit was spent in counseling/coordination of care for the patient as laid out in the A&P.   Lorella Nimrod, MD [facility] 06/14/2021, 5:16 PM

## 2021-06-15 ENCOUNTER — Other Ambulatory Visit: Payer: Self-pay

## 2021-06-15 LAB — CBC WITH DIFFERENTIAL/PLATELET
Abs Immature Granulocytes: 0.04 10*3/uL (ref 0.00–0.07)
Basophils Absolute: 0 10*3/uL (ref 0.0–0.1)
Basophils Relative: 0 %
Eosinophils Absolute: 0.5 10*3/uL (ref 0.0–0.5)
Eosinophils Relative: 6 %
HCT: 30.6 % — ABNORMAL LOW (ref 36.0–46.0)
Hemoglobin: 9 g/dL — ABNORMAL LOW (ref 12.0–15.0)
Immature Granulocytes: 1 %
Lymphocytes Relative: 27 %
Lymphs Abs: 2.3 10*3/uL (ref 0.7–4.0)
MCH: 28.3 pg (ref 26.0–34.0)
MCHC: 29.4 g/dL — ABNORMAL LOW (ref 30.0–36.0)
MCV: 96.2 fL (ref 80.0–100.0)
Monocytes Absolute: 0.8 10*3/uL (ref 0.1–1.0)
Monocytes Relative: 9 %
Neutro Abs: 4.9 10*3/uL (ref 1.7–7.7)
Neutrophils Relative %: 57 %
Platelets: 254 10*3/uL (ref 150–400)
RBC: 3.18 MIL/uL — ABNORMAL LOW (ref 3.87–5.11)
RDW: 14.9 % (ref 11.5–15.5)
WBC: 8.5 10*3/uL (ref 4.0–10.5)
nRBC: 0 % (ref 0.0–0.2)

## 2021-06-15 LAB — BASIC METABOLIC PANEL
Anion gap: 9 (ref 5–15)
BUN: 59 mg/dL — ABNORMAL HIGH (ref 8–23)
CO2: 25 mmol/L (ref 22–32)
Calcium: 8.9 mg/dL (ref 8.9–10.3)
Chloride: 107 mmol/L (ref 98–111)
Creatinine, Ser: 2.91 mg/dL — ABNORMAL HIGH (ref 0.44–1.00)
GFR, Estimated: 18 mL/min — ABNORMAL LOW (ref >60)
Glucose, Bld: 154 mg/dL — ABNORMAL HIGH (ref 70–99)
Potassium: 4.9 mmol/L (ref 3.5–5.1)
Sodium: 141 mmol/L (ref 135–145)

## 2021-06-15 LAB — MAGNESIUM: Magnesium: 2.5 mg/dL — ABNORMAL HIGH (ref 1.7–2.4)

## 2021-06-15 LAB — GLUCOSE, CAPILLARY: Glucose-Capillary: 170 mg/dL — ABNORMAL HIGH (ref 70–99)

## 2021-06-15 LAB — POTASSIUM: Potassium: 4.7 mmol/L (ref 3.5–5.1)

## 2021-06-15 MED ORDER — INSULIN GLARGINE 100 UNIT/ML SOLOSTAR PEN
20.0000 [IU] | PEN_INJECTOR | Freq: Every day | SUBCUTANEOUS | 3 refills | Status: DC
  Filled 2021-06-15 – 2021-06-16 (×2): qty 6, 30d supply, fill #0

## 2021-06-15 MED ORDER — MORPHINE SULFATE (PF) 2 MG/ML IV SOLN
1.0000 mg | Freq: Once | INTRAVENOUS | Status: AC
Start: 2021-06-15 — End: 2021-06-15
  Administered 2021-06-15: 1 mg via INTRAVENOUS
  Filled 2021-06-15: qty 1

## 2021-06-15 MED ORDER — TORSEMIDE 20 MG PO TABS
20.0000 mg | ORAL_TABLET | Freq: Every day | ORAL | 1 refills | Status: DC
  Filled 2021-06-15: qty 60, 30d supply, fill #0
  Filled 2021-06-15: qty 30, fill #0
  Filled 2021-06-16: qty 60, 30d supply, fill #0

## 2021-06-15 NOTE — Plan of Care (Signed)
?  Problem: Clinical Measurements: ?Goal: Will remain free from infection ?Outcome: Progressing ?Goal: Diagnostic test results will improve ?Outcome: Progressing ?Goal: Respiratory complications will improve ?Outcome: Progressing ?  ?

## 2021-06-15 NOTE — Discharge Summary (Signed)
Physician Discharge Summary  KADANCE MCCUISTION GSU:[id number] DOB: 28-Feb-1961 DOA: 06/10/2021  PCP: [name], MD  Admit date: 06/10/2021 Discharge date: 06/15/2021  Admitted From: Home Disposition: Home   Recommendations for Outpatient Follow-up:  Follow up with PCP in 1-2 weeks Please check potasium in 3-4 days. Please obtain BMP/CBC in one week Please follow up on the following pending results:None  Home Health:No Equipment/Devices:Rolling walker Discharge Condition: Stable CODE STATUS: Full Diet recommendation: Heart Healthy / Carb Modified   Brief/Interim Summary: Nancy [name] is a 61 y.o. female with medical history significant of DMII, HTN, HFpEF, HLD, CKD stage IV presenting with BLE swelling and dyspnea. She notes chronic BLE swelling. However, 2 days PTA she noted increased swelling that was making it difficult to stand. She weighed herself and reports a 14 lbs increase over that time. She began having difficulty lying flat, and she has had to sleep in an upright position during that time. She has also noted that she had become more short of breath with movement. She continued to take her normal lasix; but did not try any different treatments. She decided to come to the ED for help. She denies any other aggravating or alleviating factors.    On work-up in the ER she was noted to have significant lower extremity edema, elevated BNP (511), and CXR was notable for interstitial edema. She was started on IV Lasix and admitted for further work-up and treatment.  IV Lasix was discontinued due to increase in her creatinine from baseline.  She was later transitioned to torsemide.  Renal function continued to improve and at baseline before discharge. She will continue with current management and follow-up with cardiology as an outpatient.  Patient also developed hyperkalemia most likely secondary to worsening renal function, did not responded to [facility] initially and  received Kayexalate with improvement of potassium. She was advised to follow-up with PCP early next week and have her potassium levels rechecked.  She will continue rest of her home medications and follow-up with her providers.  Discharge Diagnoses:  Principal Problem:   Acute diastolic CHF (congestive heart failure) (HCC) Active Problems:   Type II diabetes mellitus with neurological manifestations, uncontrolled (HCC)   Hyperlipidemia   Hypertension   Gastroesophageal reflux disease without esophagitis   History of cerebrovascular accident (CVA) with residual deficit   CKD (chronic kidney disease), stage IV [facility])   Discharge Instructions  Discharge Instructions     Diet - low sodium heart healthy   Complete by: As directed    Discharge instructions   Complete by: As directed    It was pleasure taking care of you. We decreased the dose of Lantus, please check your blood glucose level regularly and follow-up with your primary care provider for further management. Please have your potassium level checked within next few days.   Increase activity slowly   Complete by: As directed    No dressing needed   Complete by: As directed       Allergies as of 06/15/2021       Reactions   Elemental Sulfur Hives, Other (See Comments)   PATIENT STATED THIS, MORE THAN LIKELY, SHOULD HAVE BEEN LOGGED AS "SULFA"   Hydralazine Hcl Other (See Comments)   Hair loss   Hydrocodone Itching, Other (See Comments)   Upset stomach   Metformin And Related Nausea And Vomiting, Other (See Comments)   Stomach pains, also   Other Nausea Only, Other (See Comments)   Lettuce- Does not digest  this!!   Plaquenil [hydroxychloroquine Sulfate] Hives   Shellfish-derived Products Nausea Only, Other (See Comments)   Caused an upset stomach   Shrimp (diagnostic) Nausea Only, Other (See Comments)   Upset stomach    Sulfa Antibiotics Hives        Medication List     STOP taking these medications     furosemide 20 MG tablet Commonly known as: LASIX       TAKE these medications    acetaminophen 500 MG tablet Commonly known as: TYLENOL Take 500 mg by mouth every 6 (six) hours as needed for moderate pain or headache.   albuterol 108 (90 Base) MCG/ACT inhaler Commonly known as: VENTOLIN HFA Inhale 1-2 puffs into the lungs every 6 (six) hours as needed for wheezing or shortness of breath.   amLODipine 2.5 MG tablet Commonly known as: NORVASC Take 1 tablet (2.5 mg total) by mouth daily.   aspirin EC 81 MG tablet Take 1 tablet (81 mg total) by mouth daily.   glucose blood test strip Use as instructedCheck blood sugar three times daily. E11.40   insulin glargine 100 UNIT/ML Solostar Pen Commonly known as: LANTUS Inject 20 Units into the skin at bedtime. What changed: how much to take   insulin lispro 100 UNIT/ML KwikPen Commonly known as: HumaLOG KwikPen Inject 4 units before breakfast and dinner. What changed:  how much to take how to take this when to take this additional instructions   metoprolol tartrate 50 MG tablet Commonly known as: LOPRESSOR Take 1 tablet (50 mg total) by mouth 2 (two) times daily.   OneTouch Verio w/Device Kit Check blood sugar three times daily. E11.40   True Metrix Meter w/Device Kit Check blood sugar three times daily. E11.40   Torsemide 40 MG Tabs Take 40 mg by mouth daily.       ASK your doctor about these medications    DULoxetine 20 MG capsule Commonly known as: CYMBALTA TAKE 1 CAPSULE (20 MG TOTAL) BY MOUTH DAILY.   mirtazapine 15 MG tablet Commonly known as: REMERON TAKE 1 TABLET (15 MG TOTAL) BY MOUTH AT BEDTIME.   risperiDONE 3 MG tablet Commonly known as: RISPERDAL TAKE 1 TABLET (3 MG TOTAL) BY MOUTH AT BEDTIME.               Discharge Care Instructions  (From admission, onward)           Start     Ordered   06/15/21 0000  No dressing needed        06/15/21 1108            Follow-up  Information     [name], MD. Schedule an appointment as soon as possible for a visit in 3 day(s).   Specialty: Internal Medicine Contact information: [place] [address] [phone]                Allergies  Allergen Reactions   Elemental Sulfur Hives and Other (See Comments)    PATIENT STATED THIS, MORE THAN LIKELY, SHOULD HAVE BEEN LOGGED AS "SULFA"   Hydralazine Hcl Other (See Comments)    Hair loss   Hydrocodone Itching and Other (See Comments)    Upset stomach   Metformin And Related Nausea And Vomiting and Other (See Comments)    Stomach pains, also   Other Nausea Only and Other (See Comments)    Lettuce- Does not digest this!!   Plaquenil [Hydroxychloroquine Sulfate] Hives   Shellfish-Derived Products  Nausea Only and Other (See Comments)    Caused an upset stomach   Shrimp (Diagnostic) Nausea Only and Other (See Comments)    Upset stomach    Sulfa Antibiotics Hives    Consultations: None  Procedures/Studies: DG Chest 2 View  Result Date: 06/10/2021 CLINICAL DATA:  Fluid retention. EXAM: CHEST - 2 VIEW COMPARISON:  Chest x-ray dated May 28, 2021. FINDINGS: Unchanged mild cardiomegaly. Unchanged mild interstitial edema at the lung bases and small bilateral pleural effusions. No consolidation or pneumothorax. No acute osseous abnormality. IMPRESSION: Unchanged mild congestive heart failure. Electronically Signed   By: Titus Dubin M.D.   On: 06/10/2021 13:02   DG Chest 2 View  Result Date: 05/28/2021 CLINICAL DATA:  Shortness of breath EXAM: CHEST - 2 VIEW COMPARISON:  05/09/2021 FINDINGS: Small bilateral pleural effusions. Bibasilar atelectasis or infiltrates. Heart is normal size. No effusions or acute bony abnormality. IMPRESSION: Small bilateral pleural effusions with bibasilar atelectasis or infiltrates. Electronically Signed   By: Rolm Baptise M.D.   On: 05/28/2021 02:18   ECHOCARDIOGRAM COMPLETE  Result Date:  06/11/2021    ECHOCARDIOGRAM REPORT   Patient Name:   BERDINE RASMUSSON Date of Exam: 06/11/2021 Medical Rec #:  [id number]           Height:       63.0 in Accession #:    [id number]          Weight:       235.4 lb Date of Birth:  Feb 21, 1961           BSA:          2.073 m Patient Age:    60 years            BP:           140/82 mmHg Patient Gender: F                   HR:           72 bpm. Exam Location:  Inpatient Procedure: 2D Echo, Cardiac Doppler and Color Doppler Indications:    I50.23 Acute on chronic systolic (congestive) heart failure  History:        Patient has prior history of Echocardiogram examinations, most                 recent 04/05/2021. Stroke; Risk Factors:Hypertension, Diabetes                 and Dyslipidemia.  Sonographer:    Tiffany Dance Referring Phys: [id number] [place]  1. Left ventricular ejection fraction, by estimation, is 55 to 60%. The left ventricle has normal function. The left ventricle has no regional wall motion abnormalities. There is moderate left ventricular hypertrophy. Left ventricular diastolic parameters are indeterminate.  2. Right ventricular systolic function is normal. The right ventricular size is mildly enlarged. There is mildly elevated pulmonary artery systolic pressure. The estimated right ventricular systolic pressure is 25.4 mmHg.  3. The mitral valve is normal in structure. No evidence of mitral valve regurgitation. No evidence of mitral stenosis.  4. Tricuspid valve regurgitation is mild to moderate.  5. The aortic valve is tricuspid. Aortic valve regurgitation is not visualized. No aortic stenosis is present.  6. The inferior vena cava is dilated in size with <50% respiratory variability, suggesting right atrial pressure of 15 mmHg. FINDINGS  Left Ventricle: Left ventricular ejection fraction, by estimation, is 55 to 60%. The left ventricle has normal function.  The left ventricle has no regional wall motion abnormalities. The left ventricular  internal cavity size was normal in size. There is  moderate left ventricular hypertrophy. Left ventricular diastolic parameters are indeterminate. Right Ventricle: The right ventricular size is mildly enlarged. Right vetricular wall thickness was not well visualized. Right ventricular systolic function is normal. There is mildly elevated pulmonary artery systolic pressure. The tricuspid regurgitant  velocity is 2.31 m/s, and with an assumed right atrial pressure of 15 mmHg, the estimated right ventricular systolic pressure is 56.3 mmHg. Left Atrium: Left atrial size was normal in size. Right Atrium: Right atrial size was normal in size. Pericardium: Trivial pericardial effusion is present. Mitral Valve: The mitral valve is normal in structure. No evidence of mitral valve regurgitation. No evidence of mitral valve stenosis. Tricuspid Valve: The tricuspid valve is normal in structure. Tricuspid valve regurgitation is mild to moderate. Aortic Valve: The aortic valve is tricuspid. Aortic valve regurgitation is not visualized. No aortic stenosis is present. Pulmonic Valve: The pulmonic valve was not well visualized. Pulmonic valve regurgitation is not visualized. Aorta: The aortic root and ascending aorta are structurally normal, with no evidence of dilitation. Venous: The inferior vena cava is dilated in size with less than 50% respiratory variability, suggesting right atrial pressure of 15 mmHg. IAS/Shunts: The interatrial septum was not well visualized.  LEFT VENTRICLE PLAX 2D LVIDd:         4.10 cm  Diastology LVIDs:         2.60 cm  LV e' medial:    4.46 cm/s LV PW:         1.40 cm  LV E/e' medial:  25.8 LV IVS:        1.00 cm  LV e' lateral:   5.87 cm/s LVOT diam:     1.80 cm  LV E/e' lateral: 19.6 LV SV:         43 LV SV Index:   21 LVOT Area:     2.54 cm  RIGHT VENTRICLE            IVC RV Basal diam:  3.20 cm    IVC diam: 2.10 cm RV Mid diam:    2.30 cm RV S prime:     9.14 cm/s TAPSE (M-mode): 1.8 cm LEFT  ATRIUM             Index       RIGHT ATRIUM           Index LA diam:        4.20 cm 2.03 cm/m  RA Area:     16.90 cm LA Vol (A2C):   46.6 ml 22.48 ml/m RA Volume:   47.90 ml  23.11 ml/m LA Vol (A4C):   40.4 ml 19.49 ml/m LA Biplane Vol: 47.0 ml 22.68 ml/m  AORTIC VALVE LVOT Vmax:   82.00 cm/s LVOT Vmean:  53.600 cm/s LVOT VTI:    0.168 m  AORTA Ao Root diam: 2.20 cm Ao Asc diam:  2.50 cm MITRAL VALVE                TRICUSPID VALVE MV Area (PHT): 4.21 cm     TR Peak grad:   21.3 mmHg MV Decel Time: 180 msec     TR Vmax:        231.00 cm/s MV E velocity: 115.00 cm/s MV A velocity: 70.40 cm/s   SHUNTS MV E/A ratio:  1.63         Systemic VTI:  0.17 m                             Systemic Diam: 1.80 cm Oswaldo Milian MD Electronically signed by Oswaldo Milian MD Signature Date/Time: 06/11/2021/3:56:17 PM    Final     Subjective: Patient examined today.  No new complaint.  She was glad to hear that her potasium is now normal and she can home now.  Discharge Exam: Vitals:   06/15/21 0546 06/15/21 0706  BP: (!) 187/97 (!) 169/91  Pulse: 81 79  Resp: 20   Temp: 98.5 F (36.9 C)   SpO2: 100%    Vitals:   06/14/21 2147 06/15/21 0500 06/15/21 0546 06/15/21 0706  BP: (!) 169/79  (!) 187/97 (!) 169/91  Pulse: 75  81 79  Resp: 19  20   Temp: 98.5 F (36.9 C)  98.5 F (36.9 C)   TempSrc: Oral  Oral   SpO2: 98%  100%   Weight:  104.6 kg    Height:        General: Pt is alert, awake, not in acute distress Cardiovascular: RRR, S1/S2 +, no rubs, no gallops Respiratory: CTA bilaterally, no wheezing, no rhonchi Abdominal: Soft, NT, ND, bowel sounds + Extremities: no edema, no cyanosis   The results of significant diagnostics from this hospitalization (including imaging, microbiology, ancillary and laboratory) are listed below for reference.    Microbiology: Recent Results (from the past 240 hour(s))  SARS CORONAVIRUS 2 (TAT 6-24 HRS) Nasopharyngeal Nasopharyngeal Swab     Status:  None   Collection Time: 06/10/21  2:54 PM   Specimen: Nasopharyngeal Swab  Result Value Ref Range Status   SARS Coronavirus 2 NEGATIVE NEGATIVE Final    Comment: (NOTE) SARS-CoV-2 target nucleic acids are NOT DETECTED.  The SARS-CoV-2 RNA is generally detectable in upper and lower respiratory specimens during the acute phase of infection. Negative results do not preclude SARS-CoV-2 infection, do not rule out co-infections with other pathogens, and should not be used as the sole basis for treatment or other patient management decisions. Negative results must be combined with clinical observations, patient history, and epidemiological information. The expected result is Negative.  Fact Sheet for Patients: SugarRoll.be  Fact Sheet for Healthcare Providers: https://www.woods-mathews.com/  This test is not yet approved or cleared by the [place] FDA and  has been authorized for detection and/or diagnosis of SARS-CoV-2 by FDA under an Emergency Use Authorization (EUA). This EUA will remain  in effect (meaning this test can be used) for the duration of the COVID-19 declaration under Se ction 564(b)(1) of the Act, 21 U.S.C. section 360bbb-3(b)(1), unless the authorization is terminated or revoked sooner.  Performed at [facility], [place] [address], [place], [place] [address]      Labs: BNP (last 3 results) Recent Labs    05/12/21 0512 05/28/21 0105 06/10/21 1210  BNP 281.2* 785.3* 030.0*   Basic Metabolic Panel: Recent Labs  Lab 06/11/21 0334 06/12/21 0327 06/13/21 0321 06/14/21 0355 06/14/21 1549 06/14/21 2113 06/15/21 0128 06/15/21 0559  NA 137 138 138 139  --   --  141  --   K 4.9 5.6* 5.5* 5.7* 5.9* 5.0 4.9 4.7  CL 106 106 106 107  --   --  107  --   CO2 _0 --   --  25  --   GLUCOSE 107* 50* 145* 113*  --   --  154*  --   BUN 36* 46* 55* 60*  --   --  59*  --   CREATININE 2.49* 3.15* 3.11* 2.93*  --    --  2.91*  --   CALCIUM 8.4* 8.6* 8.2* 8.3*  --   --  8.9  --   MG  --  2.5* 2.7* 2.7*  --   --  2.5*  --    Liver Function Tests: Recent Labs  Lab 06/11/21 0334  AST 10*  ALT 12  ALKPHOS 52  BILITOT 0.6  PROT 6.5  ALBUMIN 3.1*   No results for input(s): LIPASE, AMYLASE in the last 168 hours. No results for input(s): AMMONIA in the last 168 hours. CBC: Recent Labs  Lab 06/10/21 1210 06/11/21 0334 06/12/21 0327 06/13/21 0321 06/14/21 0355 06/15/21 0128  WBC 8.1 6.9 5.7 7.1 7.2 8.5  NEUTROABS 4.8  --  2.8 4.1 3.5 4.9  HGB 8.4* 7.9* 8.3* 7.7* 7.9* 9.0*  HCT 28.1* 26.4* 28.2* 26.1* 26.5* 30.6*  MCV 94.9 94.6 96.9 96.7 94.3 96.2  PLT 216 227 231 218 182 254   Cardiac Enzymes: No results for input(s): CKTOTAL, CKMB, CKMBINDEX, TROPONINI in the last 168 hours. BNP: Invalid input(s): POCBNP CBG: Recent Labs  Lab 06/14/21 0724 06/14/21 1130 06/14/21 1629 06/14/21 2152 06/15/21 0733  GLUCAP 104* 154* 130* 134* 170*   D-Dimer No results for input(s): DDIMER in the last 72 hours. Hgb A1c No results for input(s): HGBA1C in the last 72 hours. Lipid Profile No results for input(s): CHOL, HDL, LDLCALC, TRIG, CHOLHDL, LDLDIRECT in the last 72 hours. Thyroid function studies No results for input(s): TSH, T4TOTAL, T3FREE, THYROIDAB in the last 72 hours.  Invalid input(s): FREET3 Anemia work up No results for input(s): VITAMINB12, FOLATE, FERRITIN, TIBC, IRON, RETICCTPCT in the last 72 hours. Urinalysis    Component Value Date/Time   COLORURINE YELLOW 04/20/2020 2008   APPEARANCEUR CLOUDY (A) 04/20/2020 2008   LABSPEC 1.009 04/20/2020 2008   PHURINE 5.0 04/20/2020 2008   GLUCOSEU NEGATIVE 04/20/2020 2008   HGBUR NEGATIVE 04/20/2020 2008   BILIRUBINUR NEGATIVE 04/20/2020 2008   BILIRUBINUR negative 12/26/2017 [place] 04/20/2020 2008   PROTEINUR NEGATIVE 04/20/2020 2008   UROBILINOGEN 0.2 12/26/2017 1458   UROBILINOGEN 0.2 09/07/2014 0857    NITRITE NEGATIVE 04/20/2020 2008   LEUKOCYTESUR NEGATIVE 04/20/2020 2008   Sepsis Labs Invalid input(s): PROCALCITONIN,  WBC,  LACTICIDVEN Microbiology Recent Results (from the past 240 hour(s))  SARS CORONAVIRUS 2 (TAT 6-24 HRS) Nasopharyngeal Nasopharyngeal Swab     Status: None   Collection Time: 06/10/21  2:54 PM   Specimen: Nasopharyngeal Swab  Result Value Ref Range Status   SARS Coronavirus 2 NEGATIVE NEGATIVE Final    Comment: (NOTE) SARS-CoV-2 target nucleic acids are NOT DETECTED.  The SARS-CoV-2 RNA is generally detectable in upper and lower respiratory specimens during the acute phase of infection. Negative results do not preclude SARS-CoV-2 infection, do not rule out co-infections with other pathogens, and should not be used as the sole basis for treatment or other patient management decisions. Negative results must be combined with clinical observations, patient history, and epidemiological information. The expected result is Negative.  Fact Sheet for Patients: SugarRoll.be  Fact Sheet for Healthcare Providers: https://www.woods-mathews.com/  This test is not yet approved or cleared by the [place] FDA and  has been authorized for detection and/or diagnosis of SARS-CoV-2 by FDA under an Emergency Use Authorization (EUA). This EUA will remain  in  effect (meaning this test can be used) for the duration of the COVID-19 declaration under Se ction 564(b)(1) of the Act, 21 U.S.C. section 360bbb-3(b)(1), unless the authorization is terminated or revoked sooner.  Performed at [facility], [place] [address], [place], [place] [address]     Time coordinating discharge: Over 30 minutes  SIGNED:  Lorella Nimrod, MD  [facility] 06/15/2021, 11:09 AM  If 7PM-7AM, please contact night-coverage [email or web address]  This record has been created using Systems analyst. Errors have been sought and  corrected,but may not always be located. Such creation errors do not reflect on the standard of care.

## 2021-06-16 ENCOUNTER — Other Ambulatory Visit: Payer: Self-pay

## 2021-06-16 ENCOUNTER — Telehealth: Payer: Self-pay

## 2021-06-16 NOTE — Telephone Encounter (Signed)
Transition Care Management Unsuccessful Follow-up Telephone Call  Date of discharge and from where:  [facility] on 06/15/2021  Attempts:  1st Attempt  Reason for unsuccessful TCM follow-up call:  Left voice message at [phone], unable to reach at this time.   Pt need to schedule HFU appt .

## 2021-06-20 ENCOUNTER — Telehealth: Payer: Self-pay

## 2021-06-20 NOTE — Telephone Encounter (Signed)
Transition Care Management Follow-up Telephone Call Date of discharge and from where: 06/15/2021, [facility]  How have you been since you were released from the hospital? She said she is feeling fair, her legs remain swollen and she is taking her medications, including the new one- torsemide.  She said she is watching her sodium and fluid intake.  Any questions or concerns? Yes -noted above.  She is also concerned about her potassium. She said it was normal when she left the hospital but she wants to make sure that it remains normal. Scheduled her to see Dr Wynetta Emery - 06/30/2021. Informed her that the clinic will contact her if Dr Wynetta Emery wants her to have labs done prior to that appointment.   Items Reviewed: Did the pt receive and understand the discharge instructions provided? Yes  Medications obtained and verified? Yes - She said that she has all medications and did not have any questions about her med regime.  Other? No  Any new allergies since your discharge? No  Do you have support at home? Yes   Home Care and Equipment/Supplies: Were home health services ordered? no If so, what is the name of the agency? N/a  Has the agency set up a time to come to the patient's home? not applicable Were any new equipment or medical supplies ordered?  No What is the name of the medical supply agency? N/a Were you able to get the supplies/equipment?n/a Do you have any questions related to the use of the equipment or supplies? No  Functional Questionnaire: (I = Independent and D = Dependent) ADLs: independent with personal care/ADLs.  Has cane to use with ambulation.    Follow up appointments reviewed:  PCP Hospital f/u appt confirmed? Yes  Scheduled to see Dr Wynetta Emery  on 06/16/2021 @ 1050. [facility] f/u appt confirmed? Yes - cardiology - 07/07/2021; CHF- 07/11/2021.   Are transportation arrangements needed? No  If their condition worsens, is the pt aware to call PCP or go to the  Emergency Dept.? Yes Was the patient provided with contact information for the PCP's office or ED? Yes Was to pt encouraged to call back with questions or concerns? Yes

## 2021-06-30 ENCOUNTER — Ambulatory Visit: Payer: Self-pay | Attending: Internal Medicine | Admitting: Internal Medicine

## 2021-06-30 ENCOUNTER — Other Ambulatory Visit: Payer: Self-pay

## 2021-06-30 ENCOUNTER — Other Ambulatory Visit (HOSPITAL_COMMUNITY)
Admission: RE | Admit: 2021-06-30 | Discharge: 2021-06-30 | Disposition: A | Discharge status: Home / Self Care | Payer: Medicaid Other | Source: Ambulatory Visit | Attending: Internal Medicine | Admitting: Internal Medicine

## 2021-06-30 ENCOUNTER — Encounter: Payer: Self-pay | Admitting: Internal Medicine

## 2021-06-30 VITALS — BP 173/98 | HR 90 | Resp 16

## 2021-06-30 DIAGNOSIS — I272 Pulmonary hypertension, unspecified: Secondary | ICD-10-CM

## 2021-06-30 DIAGNOSIS — D62 Acute posthemorrhagic anemia: Secondary | ICD-10-CM | POA: Insufficient documentation

## 2021-06-30 DIAGNOSIS — D649 Anemia, unspecified: Secondary | ICD-10-CM | POA: Insufficient documentation

## 2021-06-30 DIAGNOSIS — N898 Other specified noninflammatory disorders of vagina: Secondary | ICD-10-CM | POA: Insufficient documentation

## 2021-06-30 DIAGNOSIS — Z794 Long term (current) use of insulin: Secondary | ICD-10-CM

## 2021-06-30 DIAGNOSIS — N184 Chronic kidney disease, stage 4 (severe): Secondary | ICD-10-CM

## 2021-06-30 DIAGNOSIS — I10 Essential (primary) hypertension: Secondary | ICD-10-CM

## 2021-06-30 DIAGNOSIS — I5032 Chronic diastolic (congestive) heart failure: Secondary | ICD-10-CM

## 2021-06-30 DIAGNOSIS — E1140 Type 2 diabetes mellitus with diabetic neuropathy, unspecified: Secondary | ICD-10-CM

## 2021-06-30 DIAGNOSIS — Z09 Encounter for follow-up examination after completed treatment for conditions other than malignant neoplasm: Secondary | ICD-10-CM

## 2021-06-30 DIAGNOSIS — E1165 Type 2 diabetes mellitus with hyperglycemia: Secondary | ICD-10-CM

## 2021-06-30 DIAGNOSIS — IMO0002 Reserved for concepts with insufficient information to code with codable children: Secondary | ICD-10-CM

## 2021-06-30 DIAGNOSIS — D631 Anemia in chronic kidney disease: Secondary | ICD-10-CM

## 2021-06-30 LAB — GLUCOSE, POCT (MANUAL RESULT ENTRY): POC Glucose: 202 mg/dl — AB (ref 70–99)

## 2021-06-30 MED ORDER — INSULIN GLARGINE 100 UNIT/ML SOLOSTAR PEN
55.0000 [IU] | PEN_INJECTOR | Freq: Every day | SUBCUTANEOUS | 3 refills | Status: DC
  Filled 2021-06-30: qty 15, 27d supply, fill #0

## 2021-06-30 NOTE — Progress Notes (Signed)
Patient ID: Nancy [name], female    DOB: 01/04/1961  MRN: [id number]  CC: Hospitalization Follow-up   Subjective: Nancy [name] is a 60 y.o. female who presents for hosp f/u.  Her husband, Nancy [name], is with her. 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   Patient last saw me 04/2020. She did not bring her medications with her to this visit and does not know them by name except for torsemide.  Patient hospitalized 6/25-30/2022 with decompensated CHF.  She presented with increasing lower extremity edema PND.  She was found to have elevated BNP of 511 and chest x-ray notable for interstitial edema.  She was diuresed but this caused an increase in creatinine from baseline.  She was transitioned to torsemide.  Renal function improved and reportedly at baseline before discharge.  She had hyperkalemia thought to be due to worsening renal function.  Did not respond to [facility] initially but improved with Kayexalate.  Today: Diastolic CHF/hypertension/pulmonary hypertension/CAD: She reports compliance with taking torsemide.  She thinks she is also taking amlodipine and metoprolol.  She does not have medications with her today.  Weight at the time of discharge was 230 pounds.  She tells me that when she last weighed herself 2 days ago her weight was 241 pounds.  +LE edema.  She endorses frequent urination when she takes the torsemide.  Endorses shortness of breath with ambulation.  She sleeps on 2 pillows and is short of breath at times.  No chest pains.  She has been limiting the salt in the foods.  Blood pressure elevated today.  She did not take the Norvasc or metoprolol this morning  before coming to the appointment.  She tells me blood pressure at home has been elevated.  DM:  Lab Results  Component Value Date   HGBA1C 7.1 (H) 05/10/2021   Checking blood sugars several times a week.  Reports range has been 170s to 190.  Med list says that she is on Lantus 20 units.  However patient tells me she has been taking 55 units once a day and Humalog 7 units with dinner.  CKD: I have reviewed her chart.  Her GFR has been in the teens to 20s over the past several months.  Creatinine at the time of hospital discharge was 2.91.  She is not on any NSAIDs.  She has an appointment coming up with the nephrologist on 07/11/2021. She has a moderate anemia.  Last hemoglobin was 9.  Iron studies done last year was consistent with anemia of chronic disease.  In regards to her mental health, she tells me that she is being seen by Dr. Sharol Given with: Behavioral health and was last seen in April of this year.  She stopped taking Remeron and Cymbalta.  She does take the risperidone. Patient Active Problem List   Diagnosis Date Noted   CKD (chronic kidney disease), stage IV (HCC) 06/11/2021   Acute diastolic CHF (congestive heart failure) (HCC) 06/10/2021   Abnormal nuclear stress test    Dyspnea on exertion 03/07/2021   Orthopnea 02/25/2021   History of cerebrovascular accident (CVA) with residual deficit 05/06/2020   Diabetic peripheral neuropathy (HCC) 04/26/2020   ARF (acute renal failure) (HCC) 04/20/2020   Generalized weakness  04/20/2020   Dehydration 04/20/2020   Generalized abdominal pain    Pancreatitis, acute 02/29/2020   Cerebrovascular accident (CVA) (HCC) 11/08/2019   Stenosis of right carotid artery 11/08/2019   Hyperlipidemia associated with type 2 diabetes mellitus (HCC) 11/08/2019   Cortical age-related cataract of both eyes 08/30/2019   Gait abnormality 08/30/2019   Statin declined 08/30/2019   Gastroesophageal reflux disease without esophagitis 04/18/2018   Parotid tumor  04/18/2018   Uncontrolled type 2 diabetes mellitus with diabetic neuropathy, with long-term current use of insulin (HCC) 10/29/2017   History of macular degeneration 10/29/2017   Chronic cervical pain 10/29/2016   Myalgia 09/14/2016   Insomnia 09/14/2016   Tinea pedis of both feet 08/20/2016   Macular degeneration 08/17/2016   Low back pain 09/21/2014   Hypertension 09/21/2014   Diabetic gastroparesis associated with type 2 diabetes mellitus (HCC) 09/14/2014   Thyroid nodule 08/17/2014   Obesity (BMI 30-39.9) 08/17/2014   Hyperlipidemia 08/05/2014   Nausea & vomiting 08/04/2014   Type II diabetes mellitus with neurological manifestations, uncontrolled (HCC) 08/04/2014     Current Outpatient Medications on File Prior to Visit  Medication Sig Dispense Refill   acetaminophen (TYLENOL) 500 MG tablet Take 500 mg by mouth every 6 (six) hours as needed for moderate pain or headache.     albuterol (VENTOLIN HFA) 108 (90 Base) MCG/ACT inhaler Inhale 1-2 puffs into the lungs every 6 (six) hours as needed for wheezing or shortness of breath.     amLODipine (NORVASC) 2.5 MG tablet Take 1 tablet (2.5 mg total) by mouth daily. 30 tablet 3   aspirin EC 81 MG tablet Take 1 tablet (81 mg total) by mouth daily. 100 tablet 1   Blood Glucose Monitoring Suppl (ONETOUCH VERIO) w/Device KIT Check blood sugar three times daily. E11.40 1 kit 0   Blood Glucose Monitoring Suppl (TRUE METRIX METER) w/Device KIT Check blood sugar three times daily. E11.40 1 kit 0   DULoxetine (CYMBALTA) 20 MG capsule TAKE 1 CAPSULE (20 MG TOTAL) BY MOUTH DAILY. (Patient taking differently: Take 20 mg by mouth daily.) 30 capsule 2   glucose blood test strip Use as instructedCheck blood sugar three times daily. E11.40 100 each 12   insulin glargine (LANTUS) 100 UNIT/ML Solostar Pen Inject 20 Units into the skin at bedtime. 15 mL 3   insulin lispro (HUMALOG KWIKPEN) 100 UNIT/ML KwikPen Inject 4 units before breakfast and dinner. 15 mL  3   metoprolol tartrate (LOPRESSOR) 50 MG tablet Take 1 tablet (50 mg total) by mouth 2 (two) times daily. 60 tablet 3   mirtazapine (REMERON) 15 MG tablet TAKE 1 TABLET (15 MG TOTAL) BY MOUTH AT BEDTIME. (Patient taking differently: Take 15 mg by mouth at bedtime.) 30 tablet 2   risperiDONE (RISPERDAL) 3 MG tablet TAKE 1 TABLET (3 MG TOTAL) BY MOUTH AT BEDTIME. (Patient taking differently: Take 3 mg by mouth at bedtime.) 30 tablet 2   torsemide (DEMADEX) 20 MG tablet Take 2 tablet (40 mg total) by mouth daily. 60 tablet 1   [DISCONTINUED] Insulin NPH Isophane & Regular (RELION 70/30 Victoria Vera) Inject 35 Units into the skin 2 (two) times daily.     No current facility-administered medications on file prior to visit.    Allergies  Allergen Reactions   Elemental Sulfur Hives and Other (See Comments)    PATIENT STATED THIS, MORE THAN LIKELY, SHOULD HAVE BEEN LOGGED AS "SULFA"   Hydralazine Hcl Other (See Comments)    Hair loss  Hydrocodone Itching and Other (See Comments)    Upset stomach   Metformin And Related Nausea And Vomiting and Other (See Comments)    Stomach pains, also   Other Nausea Only and Other (See Comments)    Lettuce- Does not digest this!!   Plaquenil [Hydroxychloroquine Sulfate] Hives   Shellfish-Derived Products Nausea Only and Other (See Comments)    Caused an upset stomach   Shrimp (Diagnostic) Nausea Only and Other (See Comments)    Upset stomach    Sulfa Antibiotics Hives    Social History   Socioeconomic History   Marital status: Legally Separated    Spouse name: [name]   Number of children: 1   Years of education: some colle   Highest education level: Not on file  Occupational History   Occupation: Employed FT as Landscape architect    Comment: [employer] , NA  Tobacco Use   Smoking status: Former    Packs/day: 0.25    Years: 0.50    Pack years: 0.13    Types: Cigarettes   Smokeless tobacco: Never   Tobacco comments:    quit yrs ago  Vaping Use   Vaping Use:  Never used  Substance and Sexual Activity   Alcohol use: No   Drug use: No   Sexual activity: Yes    Birth control/protection: None  Other Topics Concern   Not on file  Social History Narrative   02/18/20 lives alone     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    Social Determinants of Health   Financial Resource Strain: Not on file  Food Insecurity: Not on file  Transportation Needs: Not on file  Physical Activity: Not on file  Stress: Not on file  Social Connections: Not on file  Intimate Partner Violence: Not on file    Family History  Problem Relation Age of Onset   Hypertension Mother    Heart disease Mother    Diabetes Mother    Thyroid disease Mother    Congestive Heart Failure Mother    Breast cancer Maternal Grandmother    Colon cancer Maternal Grandfather    Heart attack Sister    Heart disease Brother    Hyperlipidemia Brother    Hypertension Brother    Diabetes Father    Breast cancer Maternal Aunt     Past Surgical History:  Procedure Laterality Date   ABDOMINAL HYSTERECTOMY  2005   CATARACT EXTRACTION Left 11/2019   CESAREAN SECTION  1983    CHOLECYSTECTOMY N/A 03/05/2020   Procedure: LAPAROSCOPIC CHOLECYSTECTOMY WITH INTRAOPERATIVE CHOLANGIOGRAM;  Surgeon: Donnie Mesa, MD;  Location: [facility];  Service: General;  Laterality: N/A;   ESOPHAGOGASTRODUODENOSCOPY (EGD) WITH PROPOFOL Left 08/26/2014   Procedure: ESOPHAGOGASTRODUODENOSCOPY (EGD) WITH PROPOFOL;  Surgeon: Arta Silence, MD;  Location: [facility];  Service: Endoscopy;  Laterality: Left;   ESOPHAGOGASTRODUODENOSCOPY (EGD) WITH PROPOFOL N/A 03/03/2020   Procedure: ESOPHAGOGASTRODUODENOSCOPY (EGD) WITH PROPOFOL;  Surgeon: Jerene Bears, MD;  Location: [facility];  Service: Gastroenterology;  Laterality: N/A;   RIGHT/LEFT HEART CATH AND CORONARY ANGIOGRAPHY N/A 04/27/2021   Procedure: RIGHT/LEFT HEART CATH AND CORONARY ANGIOGRAPHY;  Surgeon: [name], MD;   Location: [place] [facility];  Service: Cardiovascular;  Laterality: N/A;    ROS: Review of Systems Negative except as stated above  PHYSICAL EXAM: BP (!) 173/98   Pulse 90   Resp 16   SpO2 98%    Physical Exam  General appearance -older African-American female sitting  in wheelchair in NAD. Mental status -patient with flat affect.  She is forgetful. Neck - supple, no significant adenopathy Chest - clear to auscultation, no wheezes, rales or rhonchi, symmetric air entry Heart - normal rate, regular rhythm, normal S1, S2, no murmurs, rubs, clicks or gallops Extremities -2+BL LE edema   CMP Latest Ref Rng & Units 06/15/2021 06/15/2021 06/14/2021  Glucose 70 - 99 mg/dL - 154(H) -  BUN 8 - 23 mg/dL - 59(H) -  Creatinine 0.44 - 1.00 mg/dL - 2.91(H) -  Sodium 135 - 145 mmol/L - 141 -  Potassium 3.5 - 5.1 mmol/L 4.7 4.9 5.0  Chloride 98 - 111 mmol/L - 107 -  CO2 22 - 32 mmol/L - 25 -  Calcium 8.9 - 10.3 mg/dL - 8.9 -  Total Protein 6.5 - 8.1 g/dL - - -  Total Bilirubin 0.3 - 1.2 mg/dL - - -  Alkaline Phos 38 - 126 U/L - - -  AST 15 - 41 U/L - - -  ALT 0 - 44 U/L - - -   Lipid Panel     Component Value Date/Time   CHOL 149 02/29/2020 1315   CHOL 193 02/11/2020 1103   TRIG 97 02/29/2020 1315   HDL 49 02/29/2020 1315   HDL 40 02/11/2020 1103   CHOLHDL 3.0 02/29/2020 1315   VLDL 19 02/29/2020 1315   LDLCALC 81 02/29/2020 1315   LDLCALC 122 (H) 02/11/2020 1103    CBC    Component Value Date/Time   WBC 8.5 06/15/2021 0128   RBC 3.18 (L) 06/15/2021 0128   HGB 9.0 (L) 06/15/2021 0128   HGB 9.4 (L) 04/21/2021 1703   HGB 12.5 07/08/2008 1551   HCT 30.6 (L) 06/15/2021 0128   HCT 29.6 (L) 04/21/2021 1703   HCT 37.3 07/08/2008 1551   PLT 254 06/15/2021 0128   PLT 279 04/21/2021 1703   MCV 96.2 06/15/2021 0128   MCV 88 04/21/2021 1703   MCV 84.7 07/08/2008 1551   MCH 28.3 06/15/2021 0128   MCHC 29.4 (L) 06/15/2021 0128   RDW 14.9 06/15/2021 0128   RDW 12.6 04/21/2021  1703   RDW 13.0 07/08/2008 1551   LYMPHSABS 2.3 06/15/2021 0128   LYMPHSABS 2.5 02/23/2021 1523   LYMPHSABS 4.6 (H) 07/08/2008 1551   MONOABS 0.8 06/15/2021 0128   MONOABS 0.6 07/08/2008 1551   EOSABS 0.5 06/15/2021 0128   EOSABS 0.5 (H) 02/23/2021 1523   BASOSABS 0.0 06/15/2021 0128   BASOSABS 0.1 02/23/2021 1523   BASOSABS 0.1 07/08/2008 1551    ASSESSMENT AND PLAN: 1. Hospital discharge follow-up   2. Diastolic CHF, chronic ([place]) 3. Pulmonary hypertension ([place]) -Patient is fluid overloaded.  Stressed the importance of limiting salt in the foods. -Recommend increasing torsemide from 40 mg daily to 40 mg in the morning and 20 mg in the evening for the next 3 days.  If weight does not decrease or if shortness of breath and lower extremity edema gets worse, she should be seen in the emergency room. -Keep upcoming appointment with Dr. Gwenlyn Found on 07/06/2021  4. Essential hypertension Uncontrolled.  She did not take Norvasc or metoprolol as yet for today.  She will take them when she returns home.    5. Uncontrolled type 2 diabetes mellitus with diabetic neuropathy, with long-term current use of insulin ([place]) Encouraged her to check blood sugars more often.  She should try to check at least once to twice a day.  I will update the Lantus  prescription to reflect the dose she is taking of 55 units once a day. - POCT glucose (manual entry) - Microalbumin / creatinine urine ratio  6. CKD (chronic kidney disease) stage 4, GFR 15-29 ml/min (HCC) Inform patient that we are walking a tight rope due to the interplay of congestive heart failure and CKD.  Keep upcoming appointment with nephrologist.  Check urine microalbumin today. - Comprehensive metabolic panel  7.  Anemia and chronic kidney disease Anemia of chronic disease. - CBC - Iron, TIBC and Ferritin Panel  8. Vaginal discharge At the end of the visit, patient reported that she has been having a black vaginal discharge for the past  several months.  I asked her whether it is blood, she states she does not know "it is just black." -I will have her do a vaginal swab today and then bring her back in a few weeks to do a pelvic exam Hx of hysterectomy in 2005    Patient was given the opportunity to ask questions.  Patient verbalized understanding of the plan and was able to repeat key elements of the plan.   Orders Placed This Encounter  Procedures   CBC   Comprehensive metabolic panel   Iron, TIBC and Ferritin Panel   Microalbumin / creatinine urine ratio   POCT glucose (manual entry)     Requested Prescriptions    No prescriptions requested or ordered in this encounter    Return in about 2 weeks (around 07/14/2021) for Pelvic exam.  [name], MD, Rosalita Chessman

## 2021-06-30 NOTE — Patient Instructions (Signed)
You have excess fluid in your body.  I recommend increasing the torsemide to 20 mg 2 tablets in the morning and 1 tablet in the evening for 3 days.  After that please go back to taking 20 mg 2 tablets daily.

## 2021-06-30 NOTE — Progress Notes (Signed)
Pt would like her potassium checked

## 2021-07-01 LAB — CBC
Hematocrit: 28.3 % — ABNORMAL LOW (ref 34.0–46.6)
Hemoglobin: 8.8 g/dL — ABNORMAL LOW (ref 11.1–15.9)
MCH: 27.7 pg (ref 26.6–33.0)
MCHC: 31.1 g/dL — ABNORMAL LOW (ref 31.5–35.7)
MCV: 89 fL (ref 79–97)
Platelets: 304 10*3/uL (ref 150–450)
RBC: 3.18 x10E6/uL — ABNORMAL LOW (ref 3.77–5.28)
RDW: 13.6 % (ref 11.7–15.4)
WBC: 10 10*3/uL (ref 3.4–10.8)

## 2021-07-01 LAB — IRON,TIBC AND FERRITIN PANEL
Ferritin: 145 ng/mL (ref 15–150)
Iron Saturation: 15 % (ref 15–55)
Iron: 33 ug/dL (ref 27–139)
Total Iron Binding Capacity: 224 ug/dL — ABNORMAL LOW (ref 250–450)
UIBC: 191 ug/dL (ref 118–369)

## 2021-07-01 LAB — COMPREHENSIVE METABOLIC PANEL
ALT: 9 IU/L (ref 0–32)
AST: 10 IU/L (ref 0–40)
Albumin/Globulin Ratio: 1.1 — ABNORMAL LOW (ref 1.2–2.2)
Albumin: 3.5 g/dL — ABNORMAL LOW (ref 3.8–4.8)
Alkaline Phosphatase: 56 IU/L (ref 44–121)
BUN/Creatinine Ratio: 12 (ref 12–28)
BUN: 32 mg/dL — ABNORMAL HIGH (ref 8–27)
Bilirubin Total: 0.3 mg/dL (ref 0.0–1.2)
CO2: 27 mmol/L (ref 20–29)
Calcium: 8.8 mg/dL (ref 8.7–10.3)
Chloride: 99 mmol/L (ref 96–106)
Creatinine, Ser: 2.63 mg/dL — ABNORMAL HIGH (ref 0.57–1.00)
Globulin, Total: 3.1 g/dL (ref 1.5–4.5)
Glucose: 167 mg/dL — ABNORMAL HIGH (ref 65–99)
Potassium: 4.3 mmol/L (ref 3.5–5.2)
Sodium: 138 mmol/L (ref 134–144)
Total Protein: 6.6 g/dL (ref 6.0–8.5)
eGFR: 20 mL/min/{1.73_m2} — ABNORMAL LOW (ref >59)

## 2021-07-01 LAB — MICROALBUMIN / CREATININE URINE RATIO
Creatinine, Urine: 49.2 mg/dL
Microalb/Creat Ratio: 2693 mg/g creat — ABNORMAL HIGH (ref 0–29)
Microalbumin, Urine: 1325 ug/mL

## 2021-07-01 NOTE — Progress Notes (Signed)
Let pt know that she still has a moderate anemia likely caused by her kidney disease.  Kidney function is abnormal.  Keep upcoming appointment with kidney specialist.

## 2021-07-03 ENCOUNTER — Other Ambulatory Visit: Payer: Self-pay

## 2021-07-03 LAB — CERVICOVAGINAL ANCILLARY ONLY
Bacterial Vaginitis (gardnerella): POSITIVE — AB
Candida Glabrata: NEGATIVE
Candida Vaginitis: NEGATIVE
Chlamydia: NEGATIVE
Comment: NEGATIVE
Comment: NEGATIVE
Comment: NEGATIVE
Comment: NEGATIVE
Comment: NEGATIVE
Comment: NORMAL
Neisseria Gonorrhea: NEGATIVE
Trichomonas: NEGATIVE

## 2021-07-06 ENCOUNTER — Other Ambulatory Visit: Payer: Self-pay | Admitting: Nephrology

## 2021-07-06 DIAGNOSIS — N184 Chronic kidney disease, stage 4 (severe): Secondary | ICD-10-CM

## 2021-07-06 DIAGNOSIS — I129 Hypertensive chronic kidney disease with stage 1 through stage 4 chronic kidney disease, or unspecified chronic kidney disease: Secondary | ICD-10-CM

## 2021-07-07 ENCOUNTER — Ambulatory Visit (INDEPENDENT_AMBULATORY_CARE_PROVIDER_SITE_OTHER): Payer: Self-pay | Admitting: Cardiovascular Disease

## 2021-07-07 ENCOUNTER — Emergency Department (HOSPITAL_COMMUNITY): Payer: Medicaid Other

## 2021-07-07 ENCOUNTER — Emergency Department (HOSPITAL_COMMUNITY): Admission: EM | Admit: 2021-07-07 | Discharge: 2021-07-07 | Discharge status: Against Medical Advice | Payer: MEDICAID

## 2021-07-07 ENCOUNTER — Encounter (HOSPITAL_COMMUNITY): Payer: Self-pay

## 2021-07-07 ENCOUNTER — Encounter: Payer: Self-pay | Admitting: Cardiovascular Disease

## 2021-07-07 ENCOUNTER — Telehealth: Payer: Self-pay

## 2021-07-07 ENCOUNTER — Inpatient Hospital Stay (HOSPITAL_COMMUNITY)
Admission: EM | Admit: 2021-07-07 | Discharge: 2021-07-23 | DRG: 286 | Disposition: A | Discharge status: Home / Self Care | Payer: Medicaid Other | Attending: Cardiology | Admitting: Cardiology

## 2021-07-07 DIAGNOSIS — I5033 Acute on chronic diastolic (congestive) heart failure: Secondary | ICD-10-CM | POA: Diagnosis present

## 2021-07-07 DIAGNOSIS — Z885 Allergy status to narcotic agent status: Secondary | ICD-10-CM

## 2021-07-07 DIAGNOSIS — K219 Gastro-esophageal reflux disease without esophagitis: Secondary | ICD-10-CM | POA: Diagnosis present

## 2021-07-07 DIAGNOSIS — E1151 Type 2 diabetes mellitus with diabetic peripheral angiopathy without gangrene: Secondary | ICD-10-CM | POA: Diagnosis present

## 2021-07-07 DIAGNOSIS — E669 Obesity, unspecified: Secondary | ICD-10-CM | POA: Diagnosis present

## 2021-07-07 DIAGNOSIS — Z83438 Family history of other disorder of lipoprotein metabolism and other lipidemia: Secondary | ICD-10-CM

## 2021-07-07 DIAGNOSIS — I2510 Atherosclerotic heart disease of native coronary artery without angina pectoris: Secondary | ICD-10-CM | POA: Diagnosis present

## 2021-07-07 DIAGNOSIS — E875 Hyperkalemia: Secondary | ICD-10-CM | POA: Diagnosis not present

## 2021-07-07 DIAGNOSIS — Z888 Allergy status to other drugs, medicaments and biological substances status: Secondary | ICD-10-CM

## 2021-07-07 DIAGNOSIS — I10 Essential (primary) hypertension: Secondary | ICD-10-CM

## 2021-07-07 DIAGNOSIS — H9190 Unspecified hearing loss, unspecified ear: Secondary | ICD-10-CM | POA: Diagnosis present

## 2021-07-07 DIAGNOSIS — N184 Chronic kidney disease, stage 4 (severe): Secondary | ICD-10-CM | POA: Diagnosis present

## 2021-07-07 DIAGNOSIS — K3184 Gastroparesis: Secondary | ICD-10-CM | POA: Diagnosis present

## 2021-07-07 DIAGNOSIS — E782 Mixed hyperlipidemia: Secondary | ICD-10-CM

## 2021-07-07 DIAGNOSIS — I509 Heart failure, unspecified: Secondary | ICD-10-CM

## 2021-07-07 DIAGNOSIS — E785 Hyperlipidemia, unspecified: Secondary | ICD-10-CM | POA: Diagnosis present

## 2021-07-07 DIAGNOSIS — N179 Acute kidney failure, unspecified: Secondary | ICD-10-CM

## 2021-07-07 DIAGNOSIS — Z91013 Allergy to seafood: Secondary | ICD-10-CM

## 2021-07-07 DIAGNOSIS — Z87891 Personal history of nicotine dependence: Secondary | ICD-10-CM

## 2021-07-07 DIAGNOSIS — E1142 Type 2 diabetes mellitus with diabetic polyneuropathy: Secondary | ICD-10-CM | POA: Diagnosis present

## 2021-07-07 DIAGNOSIS — K589 Irritable bowel syndrome without diarrhea: Secondary | ICD-10-CM | POA: Diagnosis present

## 2021-07-07 DIAGNOSIS — Z794 Long term (current) use of insulin: Secondary | ICD-10-CM | POA: Diagnosis not present

## 2021-07-07 DIAGNOSIS — Z8249 Family history of ischemic heart disease and other diseases of the circulatory system: Secondary | ICD-10-CM

## 2021-07-07 DIAGNOSIS — Z20822 Contact with and (suspected) exposure to covid-19: Secondary | ICD-10-CM | POA: Diagnosis present

## 2021-07-07 DIAGNOSIS — I5031 Acute diastolic (congestive) heart failure: Secondary | ICD-10-CM

## 2021-07-07 DIAGNOSIS — E1149 Type 2 diabetes mellitus with other diabetic neurological complication: Secondary | ICD-10-CM | POA: Diagnosis present

## 2021-07-07 DIAGNOSIS — D631 Anemia in chronic kidney disease: Secondary | ICD-10-CM | POA: Diagnosis present

## 2021-07-07 DIAGNOSIS — E1122 Type 2 diabetes mellitus with diabetic chronic kidney disease: Secondary | ICD-10-CM | POA: Diagnosis present

## 2021-07-07 DIAGNOSIS — Z833 Family history of diabetes mellitus: Secondary | ICD-10-CM

## 2021-07-07 DIAGNOSIS — Z8673 Personal history of transient ischemic attack (TIA), and cerebral infarction without residual deficits: Secondary | ICD-10-CM

## 2021-07-07 DIAGNOSIS — Z9109 Other allergy status, other than to drugs and biological substances: Secondary | ICD-10-CM

## 2021-07-07 DIAGNOSIS — D649 Anemia, unspecified: Secondary | ICD-10-CM

## 2021-07-07 DIAGNOSIS — IMO0002 Reserved for concepts with insufficient information to code with codable children: Secondary | ICD-10-CM

## 2021-07-07 DIAGNOSIS — E1140 Type 2 diabetes mellitus with diabetic neuropathy, unspecified: Secondary | ICD-10-CM

## 2021-07-07 DIAGNOSIS — N289 Disorder of kidney and ureter, unspecified: Secondary | ICD-10-CM

## 2021-07-07 DIAGNOSIS — Z90710 Acquired absence of both cervix and uterus: Secondary | ICD-10-CM

## 2021-07-07 DIAGNOSIS — I6521 Occlusion and stenosis of right carotid artery: Secondary | ICD-10-CM | POA: Diagnosis present

## 2021-07-07 DIAGNOSIS — Z6839 Body mass index (BMI) 39.0-39.9, adult: Secondary | ICD-10-CM | POA: Diagnosis not present

## 2021-07-07 DIAGNOSIS — Z7982 Long term (current) use of aspirin: Secondary | ICD-10-CM

## 2021-07-07 DIAGNOSIS — Z79899 Other long term (current) drug therapy: Secondary | ICD-10-CM

## 2021-07-07 DIAGNOSIS — E1169 Type 2 diabetes mellitus with other specified complication: Secondary | ICD-10-CM | POA: Diagnosis present

## 2021-07-07 DIAGNOSIS — Z882 Allergy status to sulfonamides status: Secondary | ICD-10-CM

## 2021-07-07 DIAGNOSIS — I130 Hypertensive heart and chronic kidney disease with heart failure and stage 1 through stage 4 chronic kidney disease, or unspecified chronic kidney disease: Secondary | ICD-10-CM | POA: Diagnosis present

## 2021-07-07 DIAGNOSIS — Z9049 Acquired absence of other specified parts of digestive tract: Secondary | ICD-10-CM

## 2021-07-07 LAB — CBC WITH DIFFERENTIAL/PLATELET
Abs Immature Granulocytes: 0.02 10*3/uL (ref 0.00–0.07)
Basophils Absolute: 0 10*3/uL (ref 0.0–0.1)
Basophils Relative: 0 %
Eosinophils Absolute: 0.2 10*3/uL (ref 0.0–0.5)
Eosinophils Relative: 2 %
HCT: 30.5 % — ABNORMAL LOW (ref 36.0–46.0)
Hemoglobin: 9 g/dL — ABNORMAL LOW (ref 12.0–15.0)
Immature Granulocytes: 0 %
Lymphocytes Relative: 13 %
Lymphs Abs: 1.3 10*3/uL (ref 0.7–4.0)
MCH: 27.9 pg (ref 26.0–34.0)
MCHC: 29.5 g/dL — ABNORMAL LOW (ref 30.0–36.0)
MCV: 94.4 fL (ref 80.0–100.0)
Monocytes Absolute: 0.7 10*3/uL (ref 0.1–1.0)
Monocytes Relative: 8 %
Neutro Abs: 7.2 10*3/uL (ref 1.7–7.7)
Neutrophils Relative %: 77 %
Platelets: 317 10*3/uL (ref 150–400)
RBC: 3.23 MIL/uL — ABNORMAL LOW (ref 3.87–5.11)
RDW: 14.4 % (ref 11.5–15.5)
WBC: 9.4 10*3/uL (ref 4.0–10.5)
nRBC: 0 % (ref 0.0–0.2)

## 2021-07-07 LAB — RESP PANEL BY RT-PCR (FLU A&B, COVID) ARPGX2
Influenza A by PCR: NEGATIVE
Influenza B by PCR: NEGATIVE
SARS Coronavirus 2 by RT PCR: NEGATIVE

## 2021-07-07 LAB — BASIC METABOLIC PANEL
Anion gap: 6 (ref 5–15)
BUN: 25 mg/dL — ABNORMAL HIGH (ref 8–23)
CO2: 28 mmol/L (ref 22–32)
Calcium: 8.6 mg/dL — ABNORMAL LOW (ref 8.9–10.3)
Chloride: 104 mmol/L (ref 98–111)
Creatinine, Ser: 2.56 mg/dL — ABNORMAL HIGH (ref 0.44–1.00)
GFR, Estimated: 21 mL/min — ABNORMAL LOW (ref >60)
Glucose, Bld: 180 mg/dL — ABNORMAL HIGH (ref 70–99)
Potassium: 4.1 mmol/L (ref 3.5–5.1)
Sodium: 138 mmol/L (ref 135–145)

## 2021-07-07 LAB — BRAIN NATRIURETIC PEPTIDE: B Natriuretic Peptide: 633.1 pg/mL — ABNORMAL HIGH (ref 0.0–100.0)

## 2021-07-07 MED ORDER — HEPARIN SODIUM (PORCINE) 5000 UNIT/ML IJ SOLN
5000.0000 [IU] | Freq: Three times a day (TID) | INTRAMUSCULAR | Status: DC
Start: 2021-07-07 — End: 2021-07-23
  Administered 2021-07-07 – 2021-07-23 (×46): 5000 [IU] via SUBCUTANEOUS
  Filled 2021-07-07 (×47): qty 1

## 2021-07-07 MED ORDER — SODIUM CHLORIDE 0.9% FLUSH
3.0000 mL | Freq: Two times a day (BID) | INTRAVENOUS | Status: DC
Start: 2021-07-07 — End: 2021-07-23
  Administered 2021-07-07 – 2021-07-23 (×15): 3 mL via INTRAVENOUS

## 2021-07-07 MED ORDER — ONDANSETRON HCL 4 MG/2ML IJ SOLN: 4.0000 mg | Freq: Four times a day (QID) | INTRAMUSCULAR | Status: DC | PRN

## 2021-07-07 MED ORDER — AMLODIPINE BESYLATE 2.5 MG PO TABS
2.5000 mg | ORAL_TABLET | Freq: Every day | ORAL | Status: DC
  Administered 2021-07-07 – 2021-07-08 (×2): 2.5 mg via ORAL
  Filled 2021-07-07 (×2): qty 1

## 2021-07-07 MED ORDER — METOPROLOL TARTRATE 50 MG PO TABS
50.0000 mg | ORAL_TABLET | Freq: Two times a day (BID) | ORAL | Status: DC
  Administered 2021-07-07 – 2021-07-10 (×7): 50 mg via ORAL
  Filled 2021-07-07 (×7): qty 1

## 2021-07-07 MED ORDER — ACETAMINOPHEN 500 MG PO TABS: 500.0000 mg | ORAL_TABLET | Freq: Four times a day (QID) | ORAL | Status: DC | PRN

## 2021-07-07 MED ORDER — SODIUM CHLORIDE 0.9 % IV SOLN
250.0000 mL | INTRAVENOUS | Status: DC | PRN
  Administered 2021-07-07: 250 mL via INTRAVENOUS

## 2021-07-07 MED ORDER — SODIUM CHLORIDE 0.9% FLUSH: 3.0000 mL | INTRAVENOUS | Status: DC | PRN

## 2021-07-07 MED ORDER — ASPIRIN EC 81 MG PO TBEC: 81.0000 mg | DELAYED_RELEASE_TABLET | Freq: Every day | ORAL | Status: DC

## 2021-07-07 MED ORDER — FUROSEMIDE 10 MG/ML IJ SOLN
120.0000 mg | Freq: Once | INTRAVENOUS | Status: AC
  Administered 2021-07-07: 120 mg via INTRAVENOUS
  Filled 2021-07-07: qty 2

## 2021-07-07 MED ORDER — FUROSEMIDE 10 MG/ML IJ SOLN: 40.0000 mg | Freq: Two times a day (BID) | INTRAMUSCULAR | Status: DC

## 2021-07-07 MED ORDER — ACETAMINOPHEN 325 MG PO TABS
650.0000 mg | ORAL_TABLET | ORAL | Status: DC | PRN
  Administered 2021-07-13 – 2021-07-16 (×2): 650 mg via ORAL
  Filled 2021-07-07 (×2): qty 2

## 2021-07-07 MED ORDER — ASPIRIN EC 81 MG PO TBEC
81.0000 mg | DELAYED_RELEASE_TABLET | Freq: Every day | ORAL | Status: DC
  Administered 2021-07-08 – 2021-07-23 (×15): 81 mg via ORAL
  Filled 2021-07-07 (×15): qty 1

## 2021-07-07 NOTE — Patient Instructions (Signed)
Medication Instructions:  No Changes In Medications at this time.  *If you need a refill on your cardiac medications before your next appointment, please call your pharmacy*  Follow-Up: At [facility], you and your health needs are our priority.  As part of our continuing mission to provide you with exceptional heart care, we have created designated Provider Care Teams.  These Care Teams include your primary Cardiologist (physician) and Advanced Practice Providers (APPs -  Physician Assistants and Nurse Practitioners) who all work together to provide you with the care you need, when you need it.  Your next appointment:   Nancy [name]   The format for your next appointment:   In Person  Provider:   Quay Burow, MD  Other Instructions PLEASE REPORT STRAIGHT TO [facility]

## 2021-07-07 NOTE — ED Provider Notes (Signed)
Emergency Medicine Provider Triage Evaluation Note  Nancy [name] , a 60 y.o. female  was evaluated in triage.  Pt complains of orthopnea shortness of breath, leg swelling.  Was seen at her cardiology office earlier today and sent here for admission for IV diuresis.  She is becoming increasingly short of breath, but denies any chest pain.  Shortness breath is worse when she lays down flat..  Review of Systems  Positive: Edema, orthopnea, shortness of breath Negative: Chest pain  Physical Exam  BP (!) 182/88   Pulse 70   Temp 97.9 F (36.6 C) (Oral)   Resp 18   Ht 5\' 4"  (1.626 m)   Wt 111.8 kg   SpO2 100%   BMI 42.31 kg/m  Gen:   Awake, no distress   Resp:  Normal effort  MSK:   Moves extremities without difficulty  Other:  Pitting edema bilaterally.  Medical Decision Making  Medically screening exam initiated at 3:30 PM.  Appropriate orders placed.  ASHANA TULLO was informed that the remainder of the evaluation will be completed by another provider, this initial triage assessment does not replace that evaluation, and the importance of remaining in the ED until their evaluation is complete.     Sherrill Raring, PA-C 07/07/21 1531    Pattricia Boss, MD 07/16/21 [phone]

## 2021-07-07 NOTE — Progress Notes (Signed)
    Please see office note from Dr. Gwenlyn Found dated 07/07/21 to serve as H&P. Pt was seen in the office today found to have significant fluid volume overload. She has been admitted for IV diuresis for acute diastolic CHF. Also has a hx of HTN, HLD, and CKD stage III/IV.     Kathyrn Drown NP-C [place] Pager: [phone]

## 2021-07-07 NOTE — Assessment & Plan Note (Signed)
History of essential hypertension a blood pressure measured today 172/85.  She is on low-dose amlodipine as well as metoprolol.  She has chronic renal insufficiency.  She can probably have her amlodipine titrated to be started on hydralazine.  This can be done as an inpatient.

## 2021-07-07 NOTE — ED Triage Notes (Signed)
Pt arrives POV for eval of blt LE swelling, SOB, orthopnea. Pt sent by MD for IV diuresis and admission. Pt reports gradually worsening over the last 4 weeks, but worst today. Denies CP. Sent by PCP who evaluated her this AM.

## 2021-07-07 NOTE — Progress Notes (Signed)
07/07/2021 Nancy [name]   November 22, 1961  [id number]  Primary Physician [name], MD Primary Cardiologist: [name] MD [name], [place]  HPI:  Nancy [name] is a 60 y.o.  moderate to severely overweight married African-American female mother of 1 child with no grandchildren who does not work and was referred by her primary care provider, Carrolyn Meiers PA-C for evaluation of dyspnea on exertion and orthopnea.  She is accompanied by her husband Nancy [name] today.  I last saw her in the office 04/18/2021.. She does have a brief history of tobacco abuse having begun smoking at age 63 and stopped 4 months ago.  She has treated hypertension and diabetes and untreated hyperlipidemia.  She does have a family history of heart disease with a mother that had myocardial infarction in brother who has had stents.  She apparently had asymptomatic strokes found on CT but has never been clinically diagnosed with 1.  She is never had a heart attack.  She denies chest pain but does admit to dyspnea on exertion has been getting progressively worse as well as bilateral lower extremity edema for which she was prescribed furosemide.  She also still describes symptoms compatible with orthopnea.  She had blood work performed by her PCP that showed elevated BNP in the 300 range.     I did get a 2D echocardiogram on her 04/05/2021 that showed normal LV systolic function, grade 2 diastolic dysfunction and no valvular abnormalities.  I did increase her furosemide to 20 mg a day which did not substantively change her renal function.  Last creatinine was 1.76.  Carotid Dopplers performed 03/10/2021 showed an occluded right internal carotid artery with moderate left ICA stenosis.  I performed right and left heart cath on her 04/27/2021 that showed nonobstructive CAD with an LVEDP of 33 and high wedge and RA pressures.  She was on torsemide and has been moderate renal insufficiency.  She was admitted to the  hospital in late June for 5 days for volume overload and IV diuresis.  Since being discharged has had progressive weight gain, progressive edema, dyspnea and orthopnea.  She did go to the emergency room as this morning for shortness of breath but left before being seen.     Current Meds  Medication Sig   acetaminophen (TYLENOL) 500 MG tablet Take 500 mg by mouth every 6 (six) hours as needed for moderate pain or headache.   albuterol (VENTOLIN HFA) 108 (90 Base) MCG/ACT inhaler Inhale 1-2 puffs into the lungs every 6 (six) hours as needed for wheezing or shortness of breath.   amLODipine (NORVASC) 2.5 MG tablet Take 1 tablet (2.5 mg total) by mouth daily.   aspirin EC 81 MG tablet Take 1 tablet (81 mg total) by mouth daily.   Blood Glucose Monitoring Suppl (ONETOUCH VERIO) w/Device KIT Check blood sugar three times daily. E11.40   Blood Glucose Monitoring Suppl (TRUE METRIX METER) w/Device KIT Check blood sugar three times daily. E11.40   DULoxetine (CYMBALTA) 20 MG capsule TAKE 1 CAPSULE (20 MG TOTAL) BY MOUTH DAILY. (Patient taking differently: Take 20 mg by mouth daily.)   glucose blood test strip Use as instructedCheck blood sugar three times daily. E11.40   insulin glargine (LANTUS) 100 UNIT/ML Solostar Pen Inject 55 Units into the skin at bedtime.   insulin lispro (HUMALOG KWIKPEN) 100 UNIT/ML KwikPen Inject 4 units before breakfast and dinner.   metoprolol tartrate (LOPRESSOR) 50 MG tablet Take 1 tablet (50  mg total) by mouth 2 (two) times daily.   mirtazapine (REMERON) 15 MG tablet TAKE 1 TABLET (15 MG TOTAL) BY MOUTH AT BEDTIME. (Patient taking differently: Take 15 mg by mouth at bedtime.)   risperiDONE (RISPERDAL) 3 MG tablet TAKE 1 TABLET (3 MG TOTAL) BY MOUTH AT BEDTIME. (Patient taking differently: Take 3 mg by mouth at bedtime.)   torsemide (DEMADEX) 20 MG tablet Take 2 tablet (40 mg total) by mouth daily.     Allergies  Allergen Reactions   Elemental Sulfur Hives and Other (See  Comments)    PATIENT STATED THIS, MORE THAN LIKELY, SHOULD HAVE BEEN LOGGED AS "SULFA"   Hydralazine Hcl Other (See Comments)    Hair loss   Hydrocodone Itching and Other (See Comments)    Upset stomach   Metformin And Related Nausea And Vomiting and Other (See Comments)    Stomach pains, also   Other Nausea Only and Other (See Comments)    Lettuce- Does not digest this!!   Plaquenil [Hydroxychloroquine Sulfate] Hives   Shellfish-Derived Products Nausea Only and Other (See Comments)    Caused an upset stomach   Shrimp (Diagnostic) Nausea Only and Other (See Comments)    Upset stomach    Sulfa Antibiotics Hives    Social History   Socioeconomic History   Marital status: Legally Separated    Spouse name: Nancy [name]   Number of children: 1   Years of education: some colle   Highest education level: Not on file  Occupational History   Occupation: Employed FT as Landscape architect    Comment: [employer] , NA  Tobacco Use   Smoking status: Former    Packs/day: 0.25    Years: 0.50    Pack years: 0.13    Types: Cigarettes   Smokeless tobacco: Never   Tobacco comments:    quit yrs ago  Vaping Use   Vaping Use: Never used  Substance and Sexual Activity   Alcohol use: No   Drug use: No   Sexual activity: Yes    Birth control/protection: None  Other Topics Concern   Not on file  Social History Narrative   02/18/20 lives alone     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    Social Determinants of Health   Financial Resource Strain: Not on file  Food Insecurity: Not on file  Transportation Needs: Not on file  Physical Activity: Not on file  Stress: Not on file  Social Connections: Not on file  Intimate Partner Violence: Not on file     Review of Systems: General: negative for chills, fever, night sweats or weight changes.  Cardiovascular: negative for chest pain, dyspnea on exertion, edema, orthopnea, palpitations, paroxysmal nocturnal dyspnea or  shortness of breath Dermatological: negative for rash Respiratory: negative for cough or wheezing Urologic: negative for hematuria Abdominal: negative for nausea, vomiting, diarrhea, bright red blood per rectum, melena, or hematemesis Neurologic: negative for visual changes, syncope, or dizziness All other systems reviewed and are otherwise negative except as noted above.    Blood pressure (!) 172/85, pulse 67, height $RemoveBe'5\' 4"'IzeNWiKGg$  (1.626 m), weight 246 lb 6.4 oz (111.8 kg), SpO2 97 %.  General appearance: alert and no distress Neck: no adenopathy, no carotid bruit, no JVD, supple, symmetrical, trachea midline, and thyroid not enlarged, symmetric, no tenderness/mass/nodules Lungs: clear to auscultation bilaterally Heart: regular rate and rhythm, S1, S2 normal, no murmur, click, rub or gallop Extremities: Tense 3+ pitting edema bilaterally Pulses: 2+  and symmetric Skin: Skin color, texture, turgor normal. No rashes or lesions Neurologic: Grossly normal  EKG sinus rhythm at 67 with nonspecific ST and T wave changes.  I personally reviewed this EKG.  ASSESSMENT AND PLAN:   Hyperlipidemia History of hyperlipidemia not on statin therapy with lipid profile performed 02/29/2020 revealing total cholesterol of 49, LDL of 81 and HDL 49.  Hypertension History of essential hypertension a blood pressure measured today 172/85.  She is on low-dose amlodipine as well as metoprolol.  She has chronic renal insufficiency.  She can probably have her amlodipine titrated to be started on hydralazine.  This can be done as an inpatient.  Acute diastolic CHF (congestive heart failure) ([place]) Ms. Amaro is being seen today for progressive volume overload and diastolic heart failure.  She has orthopnea as well.  I did do a heart cath on her 04/27/2021 that showed nonobstructive CAD with an LVEDP of 33, V wave of 37 and right atrial pressure mean of 23.  Her most recent 2D echocardiogram performed 06/11/2021 revealed  normal LV systolic function with a right ventricular pressure of 36.  She was admitted late June for volume overload and diuresed.  Since that time she had progressive weight gain and lower extremity edema.  She did go to the ER this morning but left before being seen.  She has tense edema in the lower extremities and orthopnea.  She will need to be admitted for IV diuresis.     [name] MD FACP,FACC,FAHA, [facility] 07/07/2021 2:20 PM

## 2021-07-07 NOTE — Assessment & Plan Note (Signed)
Nancy [name] is being seen today for progressive volume overload and diastolic heart failure.  She has orthopnea as well.  I did do a heart cath on her 04/27/2021 that showed nonobstructive CAD with an LVEDP of 33, V wave of 37 and right atrial pressure mean of 23.  Her most recent 2D echocardiogram performed 06/11/2021 revealed normal LV systolic function with a right ventricular pressure of 36.  She was admitted late June for volume overload and diuresed.  Since that time she had progressive weight gain and lower extremity edema.  She did go to the ER this morning but left before being seen.  She has tense edema in the lower extremities and orthopnea.  She will need to be admitted for IV diuresis.

## 2021-07-07 NOTE — Telephone Encounter (Signed)
Contacted pt to go over lab results pt is aware and doesn't have any questions or concerns 

## 2021-07-07 NOTE — ED Notes (Signed)
I called patient for vitals and no one responded.  Screeners stated patient left

## 2021-07-07 NOTE — ED Provider Notes (Addendum)
[place] [facility] EMERGENCY DEPARTMENT Provider Note   CSN: [id number] Arrival date & time: 07/07/21  1448     History Chief Complaint  Patient presents with   Shortness of Breath   Edema    Nancy [name] is a 60 y.o. female.  The history is provided by the patient.  Shortness of Breath He has history of hypertension, diabetes, hyperlipidemia, stroke, diastolic heart failure, chronic kidney disease and was sent here by her cardiologist because of progressive dyspnea on exertion and orthopnea in spite of increasing doses of torsemide.  She states that she had been on torsemide 40 mg a day, and her nephrologist increased it to 60 mg a day.  She thinks that there has been some slight decrease in her leg edema but she still has severe dyspnea on exertion.  She states she can only walk 5-10 feet before she gets dyspneic.  She also has severe orthopnea.  She has not been able to lay flat for at least 3 weeks.  She denies chest pain, heaviness, tightness, pressure.  Past Medical History:  Diagnosis Date   Anemia 2006   Depression 2014   previously on amitryptiline    Diabetes mellitus with neurological manifestation ([place]) 2006   Diabetic peripheral neuropathy ([place]) 04/26/2020   Fracture of left ankle 1997    Gastroparesis 07/2016   HOH (hard of hearing) 2004    Hyperlipidemia 2006   Hypertension 2006   IBS (irritable bowel syndrome) 2002    Leukopenia 2015    Macular degeneration 11/2019   Shingles 2009    Stroke [facility]) 2020   Thyroid nodule 2004    Patient Active Problem List   Diagnosis Date Noted   CHF (congestive heart failure) ([place]) 07/07/2021   Normocytic anemia 06/30/2021   CKD (chronic kidney disease), stage IV ([place]) 65/68/1275   Acute diastolic CHF (congestive heart failure) ([place]) 06/10/2021   Abnormal nuclear stress test    Dyspnea on exertion 03/07/2021   Orthopnea 02/25/2021   History of cerebrovascular accident (CVA) with residual deficit  05/06/2020   Diabetic peripheral neuropathy ([place]) 04/26/2020   ARF (acute renal failure) ([place]) 04/20/2020   Generalized weakness 04/20/2020   Dehydration 04/20/2020   Generalized abdominal pain    Pancreatitis, acute 02/29/2020   Cerebrovascular accident (CVA) ([place]) 11/08/2019   Stenosis of right carotid artery 11/08/2019   Hyperlipidemia associated with type 2 diabetes mellitus ([place]) 11/08/2019   Cortical age-related cataract of both eyes 08/30/2019   Gait abnormality 08/30/2019   Statin declined 08/30/2019   Gastroesophageal reflux disease without esophagitis 04/18/2018   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   Uncontrolled type 2 diabetes mellitus with diabetic neuropathy, with long-term current use of insulin ([place]) 10/29/2017   History of macular degeneration 10/29/2017   Chronic cervical pain 10/29/2016   Myalgia 09/14/2016   Insomnia 09/14/2016   Tinea pedis of both feet 08/20/2016   Macular degeneration 08/17/2016   Low back pain 09/21/2014   Hypertension 09/21/2014   Diabetic gastroparesis associated with type 2 diabetes mellitus ([place]) 09/14/2014   Thyroid nodule 08/17/2014   Obesity (BMI 30-39.9) 08/17/2014   Hyperlipidemia 08/05/2014   Nausea & vomiting 08/04/2014   Type II diabetes mellitus with neurological manifestations, uncontrolled ([place]) 08/04/2014    Past Surgical History:  Procedure Laterality Date   ABDOMINAL HYSTERECTOMY  2005   CATARACT EXTRACTION Left 11/2019   CESAREAN SECTION  1983    CHOLECYSTECTOMY N/A 03/05/2020   Procedure: LAPAROSCOPIC CHOLECYSTECTOMY WITH INTRAOPERATIVE CHOLANGIOGRAM;  Surgeon: [name],  Rodman Key, MD;  Location: [facility];  Service: General;  Laterality: N/A;   ESOPHAGOGASTRODUODENOSCOPY (EGD) WITH PROPOFOL Left 08/26/2014   Procedure: ESOPHAGOGASTRODUODENOSCOPY (EGD) WITH PROPOFOL;  Surgeon: Arta Silence, MD;  Location: [facility] ENDOSCOPY;  Service: Endoscopy;  Laterality: Left;   ESOPHAGOGASTRODUODENOSCOPY (EGD) WITH PROPOFOL N/A 03/03/2020   Procedure:  ESOPHAGOGASTRODUODENOSCOPY (EGD) WITH PROPOFOL;  Surgeon: Jerene Bears, MD;  Location: [facility] ENDOSCOPY;  Service: Gastroenterology;  Laterality: N/A;   RIGHT/LEFT HEART CATH AND CORONARY ANGIOGRAPHY N/A 04/27/2021   Procedure: RIGHT/LEFT HEART CATH AND CORONARY ANGIOGRAPHY;  Surgeon: [name], MD;  Location: [facility];  Service: Cardiovascular;  Laterality: N/A;     OB History   No obstetric history on file.     Family History  Problem Relation Age of Onset   Hypertension Mother    Heart disease Mother    Diabetes Mother    Thyroid disease Mother    Congestive Heart Failure Mother    Breast cancer Maternal Grandmother    Colon cancer Maternal Grandfather    Heart attack Sister    Heart disease Brother    Hyperlipidemia Brother    Hypertension Brother    Diabetes Father    Breast cancer Maternal Aunt     Social History   Tobacco Use   Smoking status: Former    Packs/day: 0.25    Years: 0.50    Pack years: 0.13    Types: Cigarettes   Smokeless tobacco: Never   Tobacco comments:    quit yrs ago  Vaping Use   Vaping Use: Never used  Substance Use Topics   Alcohol use: No   Drug use: No    Home Medications Prior to Admission medications   Medication Sig Start Date End Date Taking? Authorizing Provider  acetaminophen (TYLENOL) 500 MG tablet Take 500 mg by mouth every 6 (six) hours as needed for moderate pain or headache.    [provider]  albuterol (VENTOLIN HFA) 108 (90 Base) MCG/ACT inhaler Inhale 1-2 puffs into the lungs every 6 (six) hours as needed for wheezing or shortness of breath.    [provider]  amLODipine (NORVASC) 2.5 MG tablet Take 1 tablet (2.5 mg total) by mouth daily. 04/18/21   [name], MD  aspirin EC 81 MG tablet Take 1 tablet (81 mg total) by mouth daily. 11/05/19   [name], MD  Blood Glucose Monitoring Suppl (ONETOUCH VERIO) w/Device KIT Check blood sugar three times daily. E11.40 05/06/20    [name], MD  Blood Glucose Monitoring Suppl (TRUE METRIX METER) w/Device KIT Check blood sugar three times daily. E11.40 07/11/20   [name], MD  DULoxetine (CYMBALTA) 20 MG capsule TAKE 1 CAPSULE (20 MG TOTAL) BY MOUTH DAILY. Patient taking differently: Take 20 mg by mouth daily. 02/06/21 02/06/22  Eulis Canner E, NP  glucose blood test strip Use as instructedCheck blood sugar three times daily. E11.40 07/11/20   [name], MD  insulin glargine (LANTUS) 100 UNIT/ML Solostar Pen Inject 55 Units into the skin at bedtime. 06/30/21   [name], MD  insulin lispro (HUMALOG KWIKPEN) 100 UNIT/ML KwikPen Inject 4 units before breakfast and dinner. 07/05/20   Camillia Herter, NP  metoprolol tartrate (LOPRESSOR) 50 MG tablet Take 1 tablet (50 mg total) by mouth 2 (two) times daily. 04/18/21   [name], MD  mirtazapine (REMERON) 15 MG tablet TAKE 1 TABLET (15 MG TOTAL) BY MOUTH AT BEDTIME. Patient taking  differently: Take 15 mg by mouth at bedtime. 02/06/21 02/06/22  Eulis Canner E, NP  risperiDONE (RISPERDAL) 3 MG tablet TAKE 1 TABLET (3 MG TOTAL) BY MOUTH AT BEDTIME. Patient taking differently: Take 3 mg by mouth at bedtime. 02/06/21 02/06/22  Salley Slaughter, NP  torsemide (DEMADEX) 20 MG tablet Take 2 tablet (40 mg total) by mouth daily. 06/15/21   Lorella Nimrod, MD  Insulin NPH Isophane & Regular (RELION 70/30 [place]) Inject 35 Units into the skin 2 (two) times daily.  08/17/14  [provider]    Allergies    Elemental sulfur, Hydralazine hcl, Hydrocodone, Metformin and related, Other, Plaquenil [hydroxychloroquine sulfate], Shellfish-derived products, Shrimp (diagnostic), and Sulfa antibiotics  Review of Systems   Review of Systems  Respiratory:  Positive for shortness of breath.   All other systems reviewed and are negative.  Physical Exam Updated Vital Signs BP (!) 182/88   Pulse 70   Temp 97.9 F (36.6 C) (Oral)   Resp 18   Ht _0   (1.626 m)   Wt 111.8 kg   SpO2 100%   BMI 42.31 kg/m   Physical Exam Vitals and nursing note reviewed.  60 year old female, resting comfortably and in no acute distress. Vital signs are significant for elevated systolic blood pressure. Oxygen saturation is 100%, which is normal. Head is normocephalic and atraumatic. PERRLA, EOMI. Oropharynx is clear. Neck is nontender and supple without adenopathy or JVD. Back is nontender and there is no CVA tenderness. Lungs have scattered rales without wheezes or rhonchi. Chest is nontender. Heart has regular rate and rhythm without murmur. Abdomen is soft, flat, nontender without masses or hepatosplenomegaly and peristalsis is normoactive. Extremities have 3+ edema, full range of motion is present. Skin is warm and dry without rash. Neurologic: Mental status is normal, cranial nerves are intact, there are no motor or sensory deficits.  ED Results / Procedures / Treatments   Labs (all labs ordered are listed, but only abnormal results are displayed) Labs Reviewed  BASIC METABOLIC PANEL - Abnormal; Notable for the following components:      Result Value   Glucose, Bld 180 (*)    BUN 25 (*)    Creatinine, Ser 2.56 (*)    Calcium 8.6 (*)    GFR, Estimated 21 (*)    All other components within normal limits  CBC WITH DIFFERENTIAL/PLATELET - Abnormal; Notable for the following components:   RBC 3.23 (*)    Hemoglobin 9.0 (*)    HCT 30.5 (*)    MCHC 29.5 (*)    All other components within normal limits  RESP PANEL BY RT-PCR (FLU A&B, COVID) ARPGX2  BRAIN NATRIURETIC PEPTIDE    EKG EKG Interpretation  Date/Time:  Friday July 07 2021 15:26:57 EDT Ventricular Rate:  71 PR Interval:  168 QRS Duration: 78 QT Interval:  414 QTC Calculation: 449 R Axis:   12 Text Interpretation: Normal sinus rhythm Nonspecific T wave abnormality Abnormal ECG When compared with ECG of 06/14/2021, T wave inversion Lateral leads is no longer present Confirmed by  Delora Fuel ([id number]) on 07/07/2021 4:09:43 PM  Radiology DG Chest 2 View  Result Date: 07/07/2021 CLINICAL DATA:  SOB EXAM: CHEST - 2 VIEW COMPARISON:  June 10, 2021 FINDINGS: The cardiomediastinal silhouette is unchanged and enlarged in contour. Small bilateral pleural effusions. No pneumothorax. Diffuse interstitial prominence. Bibasilar heterogeneous opacities most consistent with atelectasis. Surgical clips project over the RIGHT upper quadrant. No acute osseous abnormality. IMPRESSION: Constellation of  findings are favored to reflect pulmonary edema with scattered bibasilar atelectasis and small bilateral pleural effusions. Atypical infection could present similarly. Electronically Signed   By: Valentino Saxon MD   On: 07/07/2021 15:57    Procedures Procedures   Medications Ordered in ED Medications  furosemide (LASIX) 120 mg in dextrose 5 % 50 mL IVPB (has no administration in time range)    ED Course  I have reviewed the triage vital signs and the nursing notes.  Pertinent labs & imaging results that were available during my care of the patient were reviewed by me and considered in my medical decision making (see chart for details).   MDM Rules/Calculators/A&P                         Acute on chronic diastolic heart failure.  Chest x-ray is consistent with pulmonary edema.  ECG is significant for normalization of T waves in the lateral leads, no acute ST or T changes.  Labs show stable renal insufficiency and stable anemia.  Old records are reviewed confirming history of diastolic heart failure.  Recent echocardiogram showed normal ejection fraction and normal LV systolic function and indeterminant diastolic parameters.  Cardiac catheterization 04/27/2021 showed elevated left ventricular end-diastolic pressures, mild to moderate obstructions of the LAD.  She will need to be admitted for IV diuresis.  Case is discussed with cardiology service, who agree to admit the patient.  Final  Clinical Impression(s) / ED Diagnoses Final diagnoses:  Acute on chronic diastolic heart failure ([place])  Renal insufficiency  Normocytic anemia    Rx / DC Orders ED Discharge Orders     None        Delora Fuel, MD 11/00/34 [id number]    Delora Fuel, MD 64/35/39 [phone]

## 2021-07-07 NOTE — Assessment & Plan Note (Signed)
History of hyperlipidemia not on statin therapy with lipid profile performed 02/29/2020 revealing total cholesterol of 49, LDL of 81 and HDL 49.

## 2021-07-08 DIAGNOSIS — I5033 Acute on chronic diastolic (congestive) heart failure: Secondary | ICD-10-CM

## 2021-07-08 DIAGNOSIS — N184 Chronic kidney disease, stage 4 (severe): Secondary | ICD-10-CM

## 2021-07-08 LAB — GLUCOSE, CAPILLARY
Glucose-Capillary: 195 mg/dL — ABNORMAL HIGH (ref 70–99)
Glucose-Capillary: 162 mg/dL — ABNORMAL HIGH (ref 70–99)
Glucose-Capillary: 137 mg/dL — ABNORMAL HIGH (ref 70–99)

## 2021-07-08 LAB — BASIC METABOLIC PANEL
Anion gap: 7 (ref 5–15)
BUN: 26 mg/dL — ABNORMAL HIGH (ref 8–23)
CO2: 28 mmol/L (ref 22–32)
Calcium: 8.8 mg/dL — ABNORMAL LOW (ref 8.9–10.3)
Chloride: 106 mmol/L (ref 98–111)
Creatinine, Ser: 2.71 mg/dL — ABNORMAL HIGH (ref 0.44–1.00)
GFR, Estimated: 19 mL/min — ABNORMAL LOW (ref >60)
Glucose, Bld: 226 mg/dL — ABNORMAL HIGH (ref 70–99)
Potassium: 4 mmol/L (ref 3.5–5.1)
Sodium: 141 mmol/L (ref 135–145)

## 2021-07-08 LAB — MRSA NEXT GEN BY PCR, NASAL: MRSA by PCR Next Gen: NOT DETECTED

## 2021-07-08 MED ORDER — AMLODIPINE BESYLATE 2.5 MG PO TABS
2.5000 mg | ORAL_TABLET | Freq: Once | ORAL | Status: AC
  Administered 2021-07-08: 2.5 mg via ORAL
  Filled 2021-07-08: qty 1

## 2021-07-08 MED ORDER — AMLODIPINE BESYLATE 5 MG PO TABS
5.0000 mg | ORAL_TABLET | Freq: Every day | ORAL | Status: DC
  Administered 2021-07-09 – 2021-07-23 (×14): 5 mg via ORAL
  Filled 2021-07-08 (×15): qty 1

## 2021-07-08 MED ORDER — INSULIN ASPART 100 UNIT/ML IJ SOLN
0.0000 [IU] | Freq: Three times a day (TID) | INTRAMUSCULAR | Status: DC
Start: 2021-07-08 — End: 2021-07-23
  Administered 2021-07-08 – 2021-07-09 (×4): 4 [IU] via SUBCUTANEOUS
  Administered 2021-07-10: 3 [IU] via SUBCUTANEOUS
  Administered 2021-07-11: 4 [IU] via SUBCUTANEOUS
  Administered 2021-07-11: 3 [IU] via SUBCUTANEOUS
  Administered 2021-07-12: 4 [IU] via SUBCUTANEOUS
  Administered 2021-07-12 – 2021-07-13 (×3): 3 [IU] via SUBCUTANEOUS
  Administered 2021-07-13: 5 [IU] via SUBCUTANEOUS
  Administered 2021-07-14 – 2021-07-15 (×3): 4 [IU] via SUBCUTANEOUS
  Administered 2021-07-16 (×2): 3 [IU] via SUBCUTANEOUS
  Administered 2021-07-16: 4 [IU] via SUBCUTANEOUS
  Administered 2021-07-17: 7 [IU] via SUBCUTANEOUS
  Administered 2021-07-17 – 2021-07-18 (×3): 4 [IU] via SUBCUTANEOUS
  Administered 2021-07-19: 7 [IU] via SUBCUTANEOUS
  Administered 2021-07-19 – 2021-07-20 (×3): 4 [IU] via SUBCUTANEOUS
  Administered 2021-07-21 (×2): 3 [IU] via SUBCUTANEOUS
  Administered 2021-07-22: 7 [IU] via SUBCUTANEOUS
  Administered 2021-07-22: 4 [IU] via SUBCUTANEOUS
  Administered 2021-07-23: 3 [IU] via SUBCUTANEOUS

## 2021-07-08 MED ORDER — FUROSEMIDE 10 MG/ML IJ SOLN
12.0000 mg/h | INTRAVENOUS | Status: DC
  Administered 2021-07-08: 6 mg/h via INTRAVENOUS
  Administered 2021-07-10: 8 mg/h via INTRAVENOUS
  Administered 2021-07-11 – 2021-07-12 (×3): 12 mg/h via INTRAVENOUS
  Filled 2021-07-08 (×7): qty 20

## 2021-07-08 MED ORDER — INSULIN GLARGINE 100 UNIT/ML ~~LOC~~ SOLN
45.0000 [IU] | Freq: Every day | SUBCUTANEOUS | Status: DC
  Filled 2021-07-08: qty 0.45

## 2021-07-08 NOTE — Progress Notes (Signed)
Inpatient Diabetes Program Recommendations  AACE/ADA: New Consensus Statement on Inpatient Glycemic Control   Target Ranges:  Prepandial:   less than 140 mg/dL      Peak postprandial:   less than 180 mg/dL (1-2 hours)      Critically ill patients:  140 - 180 mg/dL   Results for Nancy [name], Nancy [name] (MRN [id number]) as of 07/08/2021 10:52  Ref. Range 07/07/2021 15:30 07/08/2021 00:11  Glucose Latest Ref Range: 70 - 99 mg/dL 180 (H) 226 (H)   Results for Nancy [name], Nancy [name] (MRN [id number]) as of 07/08/2021 10:52  Ref. Range 02/23/2021 14:10 05/10/2021 05:00  Hemoglobin A1C Latest Ref Range: 4.8 - 5.6 % 6.8 (A) 7.1 (H)   Review of Glycemic Control  Diabetes history: DM2 Outpatient Diabetes medications: Lantus 55 units QHS, Humalog 4 units QPM with dinner Current orders for Inpatient glycemic control: Lantus 45 units QHS, Novolog 0-20 units TID with meals  Inpatient Diabetes Program Recommendations:    Insulin: Would recommend discontinuing Lantus at this time. Consider adding Novolog 0-5 units QHS for bedtime correction. Then if glucose is consistently over 180 mg/dl with Novolog correction then could consider reordering Lantus.  NOTE: Noted consult for Diabetes Coordinator. Diabetes Coordinator is not on campus over the weekend but available by pager from 8am to 5pm for questions or concerns. Chart reviewed. Noted patient seen Dr. Wynetta Emery at [facility] on 06/30/21 and per note, "Checking blood sugars several times a week.  Reports range has been 170s to 190.  Med list says that she is on Lantus 20 units.  However patient tells me she has been taking 55 units once a day and Humalog 7 units with dinner." Noted patient was last inpatient 06/10/21-06/15/21 and was only ordered Novolog 0-15 units AC&HS for glycemic control. In reviewing glucose trends during prior hospitalization, glucose ranged from 43 mg/dl - 245 mg/dl with just Novolog correction scale. Tried calling patient on  room phone and cell phone number in chart [phone]) but no answer on either phone.   Thanks, Barnie Alderman, RN, MSN, CDE Diabetes Coordinator Inpatient Diabetes Program [phone] (Team Pager from 8am to 5pm)

## 2021-07-08 NOTE — Plan of Care (Signed)
  Problem: Education: Goal: Knowledge of General Education information will improve Description: Including pain rating scale, medication(s)/side effects and non-pharmacologic comfort measures Outcome: Progressing   Problem: Health Behavior/Discharge Planning: Goal: Ability to manage health-related needs will improve Outcome: Progressing   Problem: Clinical Measurements: Goal: Diagnostic test results will improve Outcome: Progressing Goal: Respiratory complications will improve Outcome: Progressing Goal: Cardiovascular complication will be avoided Outcome: Progressing   Problem: Activity: Goal: Risk for activity intolerance will decrease Outcome: Progressing   

## 2021-07-08 NOTE — Progress Notes (Signed)
Progress Note  Patient Name: Nancy [name] Date of Encounter: 07/08/2021  Primary Cardiologist: Quay Burow, MD  Subjective   No shortness of breath at rest, leg swelling and reported 12 pound weight gain over the last 3 weeks.  No chest pain.  Inpatient Medications    Scheduled Meds:  amLODipine  2.5 mg Oral Daily   aspirin EC  81 mg Oral Daily   heparin  5,000 Units Subcutaneous Q8H   metoprolol tartrate  50 mg Oral BID   sodium chloride flush  3 mL Intravenous Q12H   Continuous Infusions:  sodium chloride 10 mL/hr at 07/08/21 0300   PRN Meds: sodium chloride, acetaminophen, ondansetron (ZOFRAN) IV, sodium chloride flush   Vital Signs    Vitals:   07/08/21 0630 07/08/21 0729 07/08/21 0732 07/08/21 0825  BP:  (!) 179/87 (!) 166/82 (!) 173/95  Pulse:  75  73  Resp:  16    Temp:  97.7 F (36.5 C)    TempSrc:  Oral    SpO2:  95%    Weight: 108.3 kg     Height:        Intake/Output Summary (Last 24 hours) at 07/08/2021 0856 Last data filed at 07/08/2021 0833 Gross per 24 hour  Intake 144.17 ml  Output 400 ml  Net -255.83 ml   Filed Weights   07/07/21 1521 07/07/21 1930 07/08/21 0630  Weight: 111.8 kg 107.9 kg 108.3 kg    Telemetry    Sinus rhythm.  Personally reviewed.  ECG    An ECG dated 07/07/2021 was personally reviewed today and demonstrated:  Sinus rhythm with nonspecific T wave changes.  Physical Exam   GEN: No acute distress.   Neck: Increased JVD. Cardiac: RRR, 1/6 systolic murmur, rub, or gallop.  Respiratory: Nonlabored.  Decreased breath sounds with few crackles at the bases. GI: Soft, nontender, bowel sounds present. MS: 2+ edema; No deformity. Neuro:  Nonfocal. Psych: Alert and oriented x 3. Normal affect.  Labs    Chemistry Recent Labs  Lab 07/07/21 1530 07/08/21 0011  NA 138 141  K 4.1 4.0  CL 104 106  CO2 28 28  GLUCOSE 180* 226*  BUN 25* 26*  CREATININE 2.56* 2.71*  CALCIUM 8.6* 8.8*  GFRNONAA 21* 19*   ANIONGAP 6 7     Hematology Recent Labs  Lab 07/07/21 1530  WBC 9.4  RBC 3.23*  HGB 9.0*  HCT 30.5*  MCV 94.4  MCH 27.9  MCHC 29.5*  RDW 14.4  PLT 317    BNP Recent Labs  Lab 07/07/21 1530  BNP 633.1*      Radiology    DG Chest 2 View  Result Date: 07/07/2021 CLINICAL DATA:  SOB EXAM: CHEST - 2 VIEW COMPARISON:  June 10, 2021 FINDINGS: The cardiomediastinal silhouette is unchanged and enlarged in contour. Small bilateral pleural effusions. No pneumothorax. Diffuse interstitial prominence. Bibasilar heterogeneous opacities most consistent with atelectasis. Surgical clips project over the RIGHT upper quadrant. No acute osseous abnormality. IMPRESSION: Constellation of findings are favored to reflect pulmonary edema with scattered bibasilar atelectasis and small bilateral pleural effusions. Atypical infection could present similarly. Electronically Signed   By: Valentino Saxon MD   On: 07/07/2021 15:57    Cardiac Studies   Echocardiogram 06/11/2021:  1. Left ventricular ejection fraction, by estimation, is 55 to 60%. The  left ventricle has normal function. The left ventricle has no regional  wall motion abnormalities. There is moderate left ventricular hypertrophy.  Left ventricular  diastolic  parameters are indeterminate.   2. Right ventricular systolic function is normal. The right ventricular  size is mildly enlarged. There is mildly elevated pulmonary artery  systolic pressure. The estimated right ventricular systolic pressure is  25.0 mmHg.   3. The mitral valve is normal in structure. No evidence of mitral valve  regurgitation. No evidence of mitral stenosis.   4. Tricuspid valve regurgitation is mild to moderate.   5. The aortic valve is tricuspid. Aortic valve regurgitation is not  visualized. No aortic stenosis is present.   6. The inferior vena cava is dilated in size with <50% respiratory  variability, suggesting right atrial pressure of 15  mmHg.  Patient Profile     60 y.o. female with a history of hypertension, type 2 diabetes mellitus, hyperlipidemia, chronic diastolic heart failure, nonobstructive CAD, and CKD stage IV now presenting with acute on chronic diastolic heart failure and fluid overload.  Assessment & Plan   1.  Acute on chronic diastolic heart failure with volume overload.  Weight is increased to 108 kg from 104 kg at discharge in June despite increasing doses of Demadex as an outpatient.  Volume status is complicated by CKD stage IV and hypertensive heart disease as well.  2.  CKD stage IV, creatinine 2.71 with normal potassium.  3.  Nonobstructive CAD by cardiac catheterization in May of this year.  4.  Essential hypertension.  Currently on Lopressor and Norvasc.  5.  Type 2 diabetes mellitus, on insulin as an outpatient.  Plan to initiate Lasix infusion and follow urine output along with renal function.  Increase Norvasc to 5 mg daily, continue Lopressor.  Resume outpatient insulin with sliding scale coverage and consult diabetes coordinator.  Signed, Rozann Lesches, MD  07/08/2021, 8:56 AM

## 2021-07-09 LAB — GLUCOSE, CAPILLARY
Glucose-Capillary: 113 mg/dL — ABNORMAL HIGH (ref 70–99)
Glucose-Capillary: 158 mg/dL — ABNORMAL HIGH (ref 70–99)
Glucose-Capillary: 158 mg/dL — ABNORMAL HIGH (ref 70–99)
Glucose-Capillary: 146 mg/dL — ABNORMAL HIGH (ref 70–99)

## 2021-07-09 LAB — CBC
HCT: 28 % — ABNORMAL LOW (ref 36.0–46.0)
Hemoglobin: 8.4 g/dL — ABNORMAL LOW (ref 12.0–15.0)
MCH: 28.3 pg (ref 26.0–34.0)
MCHC: 30 g/dL (ref 30.0–36.0)
MCV: 94.3 fL (ref 80.0–100.0)
Platelets: 280 10*3/uL (ref 150–400)
RBC: 2.97 MIL/uL — ABNORMAL LOW (ref 3.87–5.11)
RDW: 14.6 % (ref 11.5–15.5)
WBC: 7.6 10*3/uL (ref 4.0–10.5)
nRBC: 0 % (ref 0.0–0.2)

## 2021-07-09 LAB — BASIC METABOLIC PANEL
Anion gap: 9 (ref 5–15)
BUN: 26 mg/dL — ABNORMAL HIGH (ref 8–23)
CO2: 25 mmol/L (ref 22–32)
Calcium: 8.7 mg/dL — ABNORMAL LOW (ref 8.9–10.3)
Chloride: 106 mmol/L (ref 98–111)
Creatinine, Ser: 2.66 mg/dL — ABNORMAL HIGH (ref 0.44–1.00)
GFR, Estimated: 20 mL/min — ABNORMAL LOW (ref >60)
Glucose, Bld: 112 mg/dL — ABNORMAL HIGH (ref 70–99)
Potassium: 4 mmol/L (ref 3.5–5.1)
Sodium: 140 mmol/L (ref 135–145)

## 2021-07-09 MED ORDER — METOLAZONE 2.5 MG PO TABS
5.0000 mg | ORAL_TABLET | Freq: Once | ORAL | Status: AC
  Administered 2021-07-09: 5 mg via ORAL
  Filled 2021-07-09: qty 2

## 2021-07-09 NOTE — Progress Notes (Signed)
Progress Note  Patient Name: Nancy [name] Date of Encounter: 07/09/2021  Primary Cardiologist: Quay Burow, MD  Subjective   Started to urinate more but output not accurately recorded and weight is steady.  No chest pain or breathlessness at rest.  Inpatient Medications    Scheduled Meds:  amLODipine  5 mg Oral Daily   aspirin EC  81 mg Oral Daily   heparin  5,000 Units Subcutaneous Q8H   insulin aspart  0-20 Units Subcutaneous TID WC   metoprolol tartrate  50 mg Oral BID   sodium chloride flush  3 mL Intravenous Q12H   Continuous Infusions:  sodium chloride Stopped (07/08/21 2102)   furosemide (LASIX) 200 mg in dextrose 5% 100 mL (2mg /mL) infusion 6 mg/hr (07/09/21 0200)   PRN Meds: sodium chloride, acetaminophen, ondansetron (ZOFRAN) IV, sodium chloride flush   Vital Signs    Vitals:   07/08/21 2339 07/09/21 0352 07/09/21 0500 07/09/21 0742  BP: (!) 170/91 (!) 145/91  (!) 170/93  Pulse: 62 66  78  Resp: 15 15  13   Temp: 98 F (36.7 C) 97.8 F (36.6 C)  98.2 F (36.8 C)  TempSrc: Oral Oral  Oral  SpO2: 97% 97%  96%  Weight:   108.6 kg   Height:        Intake/Output Summary (Last 24 hours) at 07/09/2021 0814 Last data filed at 07/09/2021 0200 Gross per 24 hour  Intake 412.68 ml  Output 500 ml  Net -87.32 ml   Filed Weights   07/07/21 1930 07/08/21 0630 07/09/21 0500  Weight: 107.9 kg 108.3 kg 108.6 kg    Telemetry    Sinus rhythm.  Personally reviewed.  ECG    An ECG dated 07/07/2021 was personally reviewed today and demonstrated:  Sinus rhythm with nonspecific T wave changes.  Physical Exam   GEN: No acute distress.   Neck: Increased JVD. Cardiac: RRR, 1/6 systolic murmur, rub, or gallop.  Respiratory: Nonlabored.  Decreased breath sounds with few crackles at the bases. GI: Soft, nontender, bowel sounds present. MS: 2+ edema; No deformity. Neuro:  Nonfocal. Psych: Alert and oriented x 3. Normal affect.  Labs    Chemistry Recent  Labs  Lab 07/07/21 1530 07/08/21 0011 07/09/21 0039  NA 138 141 140  K 4.1 4.0 4.0  CL 104 106 106  CO2 28 28 25   GLUCOSE 180* 226* 112*  BUN 25* 26* 26*  CREATININE 2.56* 2.71* 2.66*  CALCIUM 8.6* 8.8* 8.7*  GFRNONAA 21* 19* 20*  ANIONGAP 6 7 9      Hematology Recent Labs  Lab 07/07/21 1530 07/09/21 0039  WBC 9.4 7.6  RBC 3.23* 2.97*  HGB 9.0* 8.4*  HCT 30.5* 28.0*  MCV 94.4 94.3  MCH 27.9 28.3  MCHC 29.5* 30.0  RDW 14.4 14.6  PLT 317 280    BNP Recent Labs  Lab 07/07/21 1530  BNP 633.1*      Radiology    DG Chest 2 View  Result Date: 07/07/2021 CLINICAL DATA:  SOB EXAM: CHEST - 2 VIEW COMPARISON:  June 10, 2021 FINDINGS: The cardiomediastinal silhouette is unchanged and enlarged in contour. Small bilateral pleural effusions. No pneumothorax. Diffuse interstitial prominence. Bibasilar heterogeneous opacities most consistent with atelectasis. Surgical clips project over the RIGHT upper quadrant. No acute osseous abnormality. IMPRESSION: Constellation of findings are favored to reflect pulmonary edema with scattered bibasilar atelectasis and small bilateral pleural effusions. Atypical infection could present similarly. Electronically Signed   By: Valentino Saxon MD  On: 07/07/2021 15:57    Cardiac Studies   Echocardiogram 06/11/2021:  1. Left ventricular ejection fraction, by estimation, is 55 to 60%. The  left ventricle has normal function. The left ventricle has no regional  wall motion abnormalities. There is moderate left ventricular hypertrophy.  Left ventricular diastolic  parameters are indeterminate.   2. Right ventricular systolic function is normal. The right ventricular  size is mildly enlarged. There is mildly elevated pulmonary artery  systolic pressure. The estimated right ventricular systolic pressure is  61.6 mmHg.   3. The mitral valve is normal in structure. No evidence of mitral valve  regurgitation. No evidence of mitral stenosis.   4.  Tricuspid valve regurgitation is mild to moderate.   5. The aortic valve is tricuspid. Aortic valve regurgitation is not  visualized. No aortic stenosis is present.   6. The inferior vena cava is dilated in size with <50% respiratory  variability, suggesting right atrial pressure of 15 mmHg.  Patient Profile     60 y.o. female with a history of hypertension, type 2 diabetes mellitus, hyperlipidemia, chronic diastolic heart failure, nonobstructive CAD, and CKD stage IV now presenting with acute on chronic diastolic heart failure and fluid overload.  Assessment & Plan   1.  Acute on chronic diastolic heart failure with volume overload.  Weight increased to 108 kg from 104 kg at discharge in June despite increasing doses of Demadex as an outpatient.  Volume status is complicated by CKD stage IV and hypertensive heart disease as well.  She is currently on a Lasix drip, net urine output not accurately recorded.  2.  CKD stage IV, creatinine 2.71 down to 2.66 this morning with normal potassium.  3.  Nonobstructive CAD by cardiac catheterization in May of this year.  4.  Essential hypertension.  Currently on Lopressor and Norvasc.  5.  Type 2 diabetes mellitus, on insulin as an outpatient.  Increase Lasix drip to 8 mg/h and give dose of metolazone today.  Norvasc was increased to 5 mg daily, continue Lopressor and aspirin.  Recheck BMET tomorrow.  Signed, Rozann Lesches, MD  07/09/2021, 8:14 AM

## 2021-07-10 ENCOUNTER — Other Ambulatory Visit: Payer: Self-pay

## 2021-07-10 DIAGNOSIS — E1122 Type 2 diabetes mellitus with diabetic chronic kidney disease: Secondary | ICD-10-CM

## 2021-07-10 DIAGNOSIS — Z794 Long term (current) use of insulin: Secondary | ICD-10-CM

## 2021-07-10 LAB — BASIC METABOLIC PANEL
Anion gap: 7 (ref 5–15)
BUN: 29 mg/dL — ABNORMAL HIGH (ref 8–23)
CO2: 26 mmol/L (ref 22–32)
Calcium: 8.8 mg/dL — ABNORMAL LOW (ref 8.9–10.3)
Chloride: 104 mmol/L (ref 98–111)
Creatinine, Ser: 2.81 mg/dL — ABNORMAL HIGH (ref 0.44–1.00)
GFR, Estimated: 19 mL/min — ABNORMAL LOW (ref >60)
Glucose, Bld: 127 mg/dL — ABNORMAL HIGH (ref 70–99)
Potassium: 4 mmol/L (ref 3.5–5.1)
Sodium: 137 mmol/L (ref 135–145)

## 2021-07-10 LAB — GLUCOSE, CAPILLARY
Glucose-Capillary: 106 mg/dL — ABNORMAL HIGH (ref 70–99)
Glucose-Capillary: 136 mg/dL — ABNORMAL HIGH (ref 70–99)
Glucose-Capillary: 120 mg/dL — ABNORMAL HIGH (ref 70–99)
Glucose-Capillary: 162 mg/dL — ABNORMAL HIGH (ref 70–99)

## 2021-07-10 MED ORDER — DULOXETINE HCL 20 MG PO CPEP
20.0000 mg | ORAL_CAPSULE | Freq: Every day | ORAL | Status: DC
  Administered 2021-07-11 – 2021-07-23 (×13): 20 mg via ORAL
  Filled 2021-07-10 (×13): qty 1

## 2021-07-10 MED ORDER — MIRTAZAPINE 7.5 MG PO TABS
15.0000 mg | ORAL_TABLET | Freq: Every day | ORAL | Status: DC
  Administered 2021-07-10 – 2021-07-22 (×13): 15 mg via ORAL
  Filled 2021-07-10 (×13): qty 2

## 2021-07-10 MED ORDER — RISPERIDONE 3 MG PO TABS
3.0000 mg | ORAL_TABLET | Freq: Every day | ORAL | Status: DC
  Administered 2021-07-10 – 2021-07-22 (×13): 3 mg via ORAL
  Filled 2021-07-10 (×14): qty 1

## 2021-07-10 NOTE — Progress Notes (Signed)
Progress Note  Patient Name: Nancy [name] Date of Encounter: 07/10/2021  [facility] Cardiologist: Quay Burow, MD   Subjective   Feeling better.  No dyspnea at rest sitting up in bed, may be still a little orthopnea last night. Denies angina. Good diuresis (800 mL net fluid loss overnight, 1.3 L since admission although I suspect these values are under measured).  Weight down, but still roughly 5 pounds above previous estimation of dry weight. Blood pressure remains mildly elevated and renal function is essentially unchanged.  Normal potassium.  Inpatient Medications    Scheduled Meds:  amLODipine  5 mg Oral Daily   aspirin EC  81 mg Oral Daily   heparin  5,000 Units Subcutaneous Q8H   insulin aspart  0-20 Units Subcutaneous TID WC   metoprolol tartrate  50 mg Oral BID   sodium chloride flush  3 mL Intravenous Q12H   Continuous Infusions:  sodium chloride Stopped (07/08/21 2102)   furosemide (LASIX) 200 mg in dextrose 5% 100 mL (2mg /mL) infusion 8 mg/hr (07/10/21 0400)   PRN Meds: sodium chloride, acetaminophen, ondansetron (ZOFRAN) IV, sodium chloride flush   Vital Signs    Vitals:   07/09/21 2354 07/10/21 0424 07/10/21 0441 07/10/21 0736  BP: (!) 150/83 (!) 164/62  (!) 152/74  Pulse: 66 65    Resp: 14 17  19   Temp: 98.3 F (36.8 C) 98.2 F (36.8 C)  98.3 F (36.8 C)  TempSrc: Oral Oral    SpO2: 99% 98%    Weight:   106.6 kg   Height:        Intake/Output Summary (Last 24 hours) at 07/10/2021 0919 Last data filed at 07/10/2021 0424 Gross per 24 hour  Intake 331.3 ml  Output 1650 ml  Net -1318.7 ml   Last 3 Weights 07/10/2021 07/09/2021 07/08/2021  Weight (lbs) 235 lb 0.2 oz 239 lb 6.7 oz 238 lb 12.1 oz  Weight (kg) 106.6 kg 108.6 kg 108.3 kg      Telemetry    Sinus rhythm- Personally Reviewed  ECG    Normal sinus rhythm, ossific T wave changes, normal QTc 450 ms - Personally Reviewed  Physical Exam  Obese, appears comfortable GEN: No  acute distress.   Neck: 6-7 cm JVD and prompt hepatojugular reflux Cardiac: RRR, no murmurs, rubs, or gallops.  Respiratory: Clear to auscultation bilaterally. GI: Soft, nontender, non-distended  MS: No edema; No deformity. Neuro:  Nonfocal  Psych: Normal affect   Labs    High Sensitivity Troponin:  No results for input(s): TROPONINIHS in the last 720 hours.    Chemistry Recent Labs  Lab 07/08/21 0011 07/09/21 0039 07/10/21 0006  NA 141 140 137  K 4.0 4.0 4.0  CL 106 106 104  CO2 28 25 26   GLUCOSE 226* 112* 127*  BUN 26* 26* 29*  CREATININE 2.71* 2.66* 2.81*  CALCIUM 8.8* 8.7* 8.8*  GFRNONAA 19* 20* 19*  ANIONGAP 7 9 7      Hematology Recent Labs  Lab 07/07/21 1530 07/09/21 0039  WBC 9.4 7.6  RBC 3.23* 2.97*  HGB 9.0* 8.4*  HCT 30.5* 28.0*  MCV 94.4 94.3  MCH 27.9 28.3  MCHC 29.5* 30.0  RDW 14.4 14.6  PLT 317 280    BNP Recent Labs  Lab 07/07/21 1530  BNP 633.1*     DDimer No results for input(s): DDIMER in the last 168 hours.   Radiology    No results found.  Cardiac Studies   Echocardiogram 06/11/2021:  1. Left ventricular ejection fraction, by estimation, is 55 to 60%. The  left ventricle has normal function. The left ventricle has no regional  wall motion abnormalities. There is moderate left ventricular hypertrophy.  Left ventricular diastolic  parameters are indeterminate.   2. Right ventricular systolic function is normal. The right ventricular  size is mildly enlarged. There is mildly elevated pulmonary artery  systolic pressure. The estimated right ventricular systolic pressure is  10.0 mmHg.   3. The mitral valve is normal in structure. No evidence of mitral valve  regurgitation. No evidence of mitral stenosis.   4. Tricuspid valve regurgitation is mild to moderate.   5. The aortic valve is tricuspid. Aortic valve regurgitation is not  visualized. No aortic stenosis is present.   6. The inferior vena cava is dilated in size with  <50% respiratory  variability, suggesting right atrial pressure of 15 mmHg.  Patient Profile     60 y.o. female admitted for acute on chronic diastolic heart failure with a history of hypertension, type 2 diabetes mellitus, nonobstructive CAD and CKD stage IV  Assessment & Plan    CHF: Still has clinical signs of hypervolemia.  Continue diuresis till we reach a weight of roughly 104 kg, while monitoring renal function.  Received metolazone yesterday, today we will continue just with the furosemide drip. CKD 4: Creatinine nominally higher at 2.8, but BUN unchanged. HTN: Systolic blood pressure remains elevated, but amlodipine dose just increased. DM2: Insulin requiring.  For questions or updates, please contact [facility] Please consult [email or web address] for contact info under        Signed, Sanda Klein, MD  07/10/2021, 9:19 AM

## 2021-07-10 NOTE — Progress Notes (Signed)
Inpatient Diabetes Program Recommendations  AACE/ADA: New Consensus Statement on Inpatient Glycemic Control   Target Ranges:  Prepandial:   less than 140 mg/dL      Peak postprandial:   less than 180 mg/dL (1-2 hours)      Critically ill patients:  140 - 180 mg/dL   Results for Nancy [name], Nancy [name] (MRN [id number]) as of 07/10/2021 15:02  Ref. Range 07/09/2021 05:49 07/09/2021 11:09 07/09/2021 14:59 07/09/2021 21:25 07/10/2021 06:21 07/10/2021 11:04  Glucose-Capillary Latest Ref Range: 70 - 99 mg/dL 113 (H) 158 (H) 158 (H) 146 (H) 106 (H) 136 (H)  Results for Nancy [name], Nancy [name] (MRN [id number]) as of 07/10/2021 15:02  Ref. Range 05/10/2021 05:00  Hemoglobin A1C Latest Ref Range: 4.8 - 5.6 % 7.1 (H)   Review of Glycemic Control  Diabetes history: DM2 Outpatient Diabetes medications: Lantus 55 units QHS, Humalog 4 units QPM with supper Current orders for Inpatient glycemic control: Novolog 0-20 units TID with meals  NOTE: Spoke with patient about diabetes and home regimen for diabetes control. Patient reports being followed by [facility] for diabetes management and currently taking Lantus 55 units QHS and Humalog 4 units with supper as an outpatient for diabetes control. Inquired about whether patient ever adjust dose of Lantus or Humalog or skips and patient states she is consistently taking it everyday as prescribed. Patient states she checks her glucose one time a day and it is usually in the low to mid 100's mg/dl.  Explained that she is currently only ordered Novolog correction and glucose has been 106-158 mg/dl over the past 24 hours with very little Novolog correction. Asked patient if she had any idea what may be different that she would require so much insulin outpatient versus what she is receiving inpatient and patient stated she did not know. Patient states she usually tries to follow carb modified diet outpatient and feels her diet outpatient is similar to diet she is  on as an inpatient. Patient states she rarely has hypoglycemia. Encouraged patient to try to check glucose 2-3 times a day, to take glucometer with her to follow up appointments, and be sure to reach out to provider if she is having issues with hypoglycemia.  Patient verbalized understanding of information discussed and reports no further questions at this time related to diabetes.  Thanks, Barnie Alderman, RN, MSN, CDE Diabetes Coordinator Inpatient Diabetes Program [phone] (Team Pager)

## 2021-07-11 ENCOUNTER — Telehealth: Payer: Self-pay

## 2021-07-11 ENCOUNTER — Encounter (HOSPITAL_COMMUNITY): Payer: Self-pay | Admitting: Cardiology

## 2021-07-11 DIAGNOSIS — I5043 Acute on chronic combined systolic (congestive) and diastolic (congestive) heart failure: Secondary | ICD-10-CM

## 2021-07-11 LAB — BASIC METABOLIC PANEL
Anion gap: 11 (ref 5–15)
BUN: 35 mg/dL — ABNORMAL HIGH (ref 8–23)
CO2: 24 mmol/L (ref 22–32)
Calcium: 9 mg/dL (ref 8.9–10.3)
Chloride: 102 mmol/L (ref 98–111)
Creatinine, Ser: 2.86 mg/dL — ABNORMAL HIGH (ref 0.44–1.00)
GFR, Estimated: 18 mL/min — ABNORMAL LOW (ref >60)
Glucose, Bld: 125 mg/dL — ABNORMAL HIGH (ref 70–99)
Potassium: 4.2 mmol/L (ref 3.5–5.1)
Sodium: 137 mmol/L (ref 135–145)

## 2021-07-11 LAB — LIPID PANEL
Cholesterol: 169 mg/dL (ref 0–200)
HDL: 52 mg/dL (ref >40)
LDL Cholesterol: 99 mg/dL (ref 0–99)
Total CHOL/HDL Ratio: 3.3 RATIO
Triglycerides: 88 mg/dL (ref <150)
VLDL: 18 mg/dL (ref 0–40)

## 2021-07-11 LAB — GLUCOSE, CAPILLARY
Glucose-Capillary: 118 mg/dL — ABNORMAL HIGH (ref 70–99)
Glucose-Capillary: 149 mg/dL — ABNORMAL HIGH (ref 70–99)
Glucose-Capillary: 177 mg/dL — ABNORMAL HIGH (ref 70–99)
Glucose-Capillary: 195 mg/dL — ABNORMAL HIGH (ref 70–99)

## 2021-07-11 LAB — MAGNESIUM: Magnesium: 2.1 mg/dL (ref 1.7–2.4)

## 2021-07-11 MED ORDER — METOLAZONE 2.5 MG PO TABS
2.5000 mg | ORAL_TABLET | Freq: Once | ORAL | Status: AC
  Administered 2021-07-11: 2.5 mg via ORAL
  Filled 2021-07-11: qty 1

## 2021-07-11 MED ORDER — CARVEDILOL 12.5 MG PO TABS
12.5000 mg | ORAL_TABLET | Freq: Two times a day (BID) | ORAL | Status: DC
  Administered 2021-07-11 – 2021-07-13 (×4): 12.5 mg via ORAL
  Filled 2021-07-11 (×4): qty 1

## 2021-07-11 MED ORDER — METOPROLOL TARTRATE 50 MG PO TABS
50.0000 mg | ORAL_TABLET | Freq: Two times a day (BID) | ORAL | Status: DC
  Administered 2021-07-11: 50 mg via ORAL
  Filled 2021-07-11: qty 1

## 2021-07-11 MED ORDER — ATORVASTATIN CALCIUM 40 MG PO TABS
40.0000 mg | ORAL_TABLET | Freq: Every day | ORAL | Status: DC
  Administered 2021-07-11 – 2021-07-23 (×13): 40 mg via ORAL
  Filled 2021-07-11 (×13): qty 1

## 2021-07-11 NOTE — Plan of Care (Signed)
  Problem: Clinical Measurements: Goal: Ability to maintain clinical measurements within normal limits will improve Outcome: Progressing Goal: Cardiovascular complication will be avoided Outcome: Progressing   Problem: Cardiac: Goal: Ability to achieve and maintain adequate cardiopulmonary perfusion will improve Outcome: Progressing   Problem: Clinical Measurements: Goal: Diagnostic test results will improve Outcome: Not Progressing

## 2021-07-11 NOTE — Consult Note (Addendum)
Advanced Heart Failure Team Consult Note   Primary Physician: [name], MD PCP-Cardiologist:  Quay Burow, MD  Reason for Consultation: Acute on chronic diastolic heart failure   HPI:    Nancy [name] is seen today for evaluation of acute on chronic diastolic heart failure at the request of Dr. Sallyanne Kuster, Cardiology.   60 y/o AAF w/ chronic diastolic heart failure w/ multiple readmissions for a/c CHF in the last 2 months, Stage IV CKD (baseline SCr ~2-3), HTN, T2DM, HLD, CVA, PVD (carotid artery duplex 3/22: occluded Rt ICA, mod Lt ICA stenosis) and tobacco abuse.   Echo 11/2019: EF 55-60%, RV normal. No LVH. No significant valvular dysfunction   Recently referred to Dr. Gwenlyn Found for [facility] by PCP in April of this year. Complained of progressive exertional dyspnea, LEE and orthopnea. Echo 4/22: EF 60-65%, GIIDD, mild LVH, RV normal. NST w/ findings suggestive of possible anterior ischemia vs breast attenuation. Subsequent cardiac cath on 04/27/21 showed mild nonobstructive CAD and RHC hemodynamics c/w pulmonary HTN (see cath data below). Diuretics were increased w/ plans to refer to the [facility]. Dry wt felt to be ~228 lb.   Unfortunately, she has had 3 consecutive re-admissions for a/c dCHF since that time.  Admitted 5/24-5/27=> discharged home on Lasix 40 daily  Admitted 6/25-6/30 =>SCr bump to 3.2 (2.9 at d/c). Discharged home on Torsemide 40 daily. Echo EF 55-60%, RV normal  Admitted 07/07/21= > direct admit from Dr. Kennon Holter office w/ marked fluid overload, progressive wt gain, exertional dyspnea, LEE and orthopnea. Clinic BP elevated 172/85. SCr 2.6 on admit. Started on lasix gtt.   She is responding well to IV Lasix. Currently on gtt at 8/hr. Wt down 12 lb since admit from 246lb>>234 lb. Slight bump in SCr to 2.9. GFR <25. BP has been elevated. Home amlodipine has been increased to 5 mg daily, also on Lopressor 50 bid. SBPs now 140s-160s   Overall feeling better but remains  fluid overloaded and unable to lay flat.   Cardiac Studies   Echos  12/20: EF 55-60%, RV normal. No LVH. No significant valvular dysfunction  4/22: EF 60-65%, GIIDD, mild LVH, RV normal.  6/22: EF 55-60%, RV normal    R/LHC   Ost LAD to Prox LAD lesion is 40% stenosed.   Mid LAD lesion is 50% stenosed.   Mid LAD to Dist LAD lesion is 40% stenosed.   Hemodynamic findings consistent with pulmonary hypertension.   Right Heart Pressures Hemodynamic findings consistent with pulmonary hypertension.  1: Right atrial pressure-30/26, mean 23  2: Right ventricular pressure- 65/7, end-diastolic 24  3: Pulmonary artery pressure- 96/25, mean 51  4: Pulmonary wedge pressure-A-wave 28, V wave 37, mean 27  5: LVEDP- 33  6: Cardiac output-5 L/min with an index of 2.5 L/min/m     Review of Systems: [y] = yes, [ ]  = no   General: Weight gain [ Y]; Weight loss [ ] ; Anorexia [ ] ; Fatigue [ ] ; Fever [ ] ; Chills [ ] ; Weakness [ ]   Cardiac: Chest pain/pressure [ ] ; Resting SOB [Y ]; Exertional SOB [ Y]; Orthopnea [ Y]; Pedal Edema [ Y]; Palpitations [ ] ; Syncope [ ] ; Presyncope [ ] ; Paroxysmal nocturnal dyspnea[ Y]  Pulmonary: Cough [ ] ; Wheezing[ ] ; Hemoptysis[ ] ; Sputum [ ] ; Snoring [ ]   GI: Vomiting[ ] ; Dysphagia[ ] ; Melena[ ] ; Hematochezia [ ] ; Heartburn[ ] ; Abdominal pain [ ] ; Constipation [ ] ; Diarrhea [ ] ; BRBPR [ ]   GU: Hematuria[ ] ;  Dysuria [ ] ; Nocturia[ ]   Vascular: Pain in legs with walking [ ] ; Pain in feet with lying flat [ ] ; Non-healing sores [ ] ; Stroke [ ] ; TIA [ ] ; Slurred speech [ ] ;  Neuro: Headaches[ ] ; Vertigo[ ] ; Seizures[ ] ; Paresthesias[ ] ;Blurred vision [ ] ; Diplopia [ ] ; Vision changes [ ]   Ortho/Skin: Arthritis [ ] ; Joint pain [ ] ; Muscle pain [ ] ; Joint swelling [ ] ; Back Pain [ ] ; Rash [ ]   Psych: Depression[ ] ; Anxiety[ ]   Heme: Bleeding problems [ ] ; Clotting disorders [ ] ; Anemia [ ]   Endocrine: Diabetes [ ] ; Thyroid dysfunction[ ]   Home Medications Prior to  Admission medications   Medication Sig Start Date End Date Taking? Authorizing Provider  acetaminophen (TYLENOL) 500 MG tablet Take 500 mg by mouth every 6 (six) hours as needed for moderate pain or headache.   Yes [provider]  albuterol (VENTOLIN HFA) 108 (90 Base) MCG/ACT inhaler Inhale 1-2 puffs into the lungs every 6 (six) hours as needed for wheezing or shortness of breath.   Yes [provider]  amLODipine (NORVASC) 2.5 MG tablet Take 1 tablet (2.5 mg total) by mouth daily. 04/18/21  Yes [name], MD  aspirin EC 81 MG tablet Take 1 tablet (81 mg total) by mouth daily. 11/05/19  Yes [name], MD  DULoxetine (CYMBALTA) 20 MG capsule TAKE 1 CAPSULE (20 MG TOTAL) BY MOUTH DAILY. Patient taking differently: Take 20 mg by mouth daily. 02/06/21 02/06/22 Yes Eulis Canner E, NP  insulin glargine (LANTUS) 100 UNIT/ML Solostar Pen Inject 55 Units into the skin at bedtime. 06/30/21  Yes [name], MD  insulin lispro (HUMALOG KWIKPEN) 100 UNIT/ML KwikPen Inject 4 units before breakfast and dinner. Patient taking differently: Inject 4 Units into the skin every evening. 07/05/20  Yes Minette Brine, Amy J, NP  metoprolol tartrate (LOPRESSOR) 50 MG tablet Take 1 tablet (50 mg total) by mouth 2 (two) times daily. 04/18/21  Yes [name], MD  mirtazapine (REMERON) 15 MG tablet TAKE 1 TABLET (15 MG TOTAL) BY MOUTH AT BEDTIME. Patient taking differently: Take 15 mg by mouth at bedtime. 02/06/21 02/06/22 Yes Eulis Canner E, NP  risperiDONE (RISPERDAL) 3 MG tablet TAKE 1 TABLET (3 MG TOTAL) BY MOUTH AT BEDTIME. Patient taking differently: Take 3 mg by mouth at bedtime. 02/06/21 02/06/22 Yes Eulis Canner E, NP  torsemide (DEMADEX) 20 MG tablet Take 2 tablet (40 mg total) by mouth daily. Patient taking differently: Take 30 mg by mouth daily. 06/15/21  Yes Lorella Nimrod, MD  Blood Glucose Monitoring Suppl (ONETOUCH VERIO) w/Device KIT Check blood sugar three times  daily. E11.40 05/06/20   [name], MD  Blood Glucose Monitoring Suppl (TRUE METRIX METER) w/Device KIT Check blood sugar three times daily. E11.40 07/11/20   [name], MD  glucose blood test strip Use as instructedCheck blood sugar three times daily. E11.40 07/11/20   [name], MD  Insulin NPH Isophane & Regular (RELION 70/30 [place]) Inject 35 Units into the skin 2 (two) times daily.  08/17/14  [provider]    Past Medical History: Past Medical History:  Diagnosis Date   Anemia 2006   Depression 2014   previously on amitryptiline    Diabetes mellitus with neurological manifestation ([place]) 2006   Diabetic peripheral neuropathy ([place]) 04/26/2020   Fracture of left ankle 1997    Gastroparesis 07/2016   HOH (hard of hearing) 2004    Hyperlipidemia 2006  Hypertension 2006   IBS (irritable bowel syndrome) 2002    Leukopenia 2015    Macular degeneration 11/2019   Shingles 2009    Stroke [facility]) 2020   Thyroid nodule 2004    Past Surgical History: Past Surgical History:  Procedure Laterality Date   ABDOMINAL HYSTERECTOMY  2005   CATARACT EXTRACTION Left 11/2019   CESAREAN SECTION  1983    CHOLECYSTECTOMY N/A 03/05/2020   Procedure: LAPAROSCOPIC CHOLECYSTECTOMY WITH INTRAOPERATIVE CHOLANGIOGRAM;  Surgeon: Donnie Mesa, MD;  Location: [facility];  Service: General;  Laterality: N/A;   ESOPHAGOGASTRODUODENOSCOPY (EGD) WITH PROPOFOL Left 08/26/2014   Procedure: ESOPHAGOGASTRODUODENOSCOPY (EGD) WITH PROPOFOL;  Surgeon: Arta Silence, MD;  Location: [facility];  Service: Endoscopy;  Laterality: Left;   ESOPHAGOGASTRODUODENOSCOPY (EGD) WITH PROPOFOL N/A 03/03/2020   Procedure: ESOPHAGOGASTRODUODENOSCOPY (EGD) WITH PROPOFOL;  Surgeon: Jerene Bears, MD;  Location: [facility];  Service: Gastroenterology;  Laterality: N/A;   RIGHT/LEFT HEART CATH AND CORONARY ANGIOGRAPHY N/A 04/27/2021   Procedure: RIGHT/LEFT HEART CATH AND CORONARY ANGIOGRAPHY;  Surgeon: [name], MD;  Location: [place] [facility];  Service: Cardiovascular;  Laterality: N/A;    Family History: Family History  Problem Relation Age of Onset   Hypertension Mother    Heart disease Mother    Diabetes Mother    Thyroid disease Mother    Congestive Heart Failure Mother    Breast cancer Maternal Grandmother    Colon cancer Maternal Grandfather    Heart attack Sister    Heart disease Brother    Hyperlipidemia Brother    Hypertension Brother    Diabetes Father    Breast cancer Maternal Aunt     Social History: Social History   Socioeconomic History   Marital status: Legally Separated    Spouse name: [name]   Number of children: 1   Years of education: some colle   Highest education level: Not on file  Occupational History   Occupation: Employed FT as Landscape architect    Comment: [employer] , NA  Tobacco Use   Smoking status: Former    Packs/day: 0.25    Years: 0.50    Pack years: 0.13    Types: Cigarettes   Smokeless tobacco: Never   Tobacco comments:    quit yrs ago  Vaping Use   Vaping Use: Never used  Substance and Sexual Activity   Alcohol use: No   Drug use: No   Sexual activity: Yes    Birth control/protection: None  Other Topics Concern   Not on file  Social History Narrative   02/18/20 lives alone     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    Social Determinants of Health   Financial Resource Strain: Not on file  Food Insecurity: Not on file  Transportation Needs: Not on file  Physical Activity: Not on file  Stress: Not on file  Social Connections: Not on file    Allergies:  Allergies  Allergen Reactions   Elemental Sulfur Hives and Other (See Comments)    PATIENT STATED THIS, MORE THAN LIKELY, SHOULD HAVE BEEN LOGGED AS "SULFA"   Hydralazine Hcl Other (See Comments)    Hair loss   Hydrocodone Itching and Other (See Comments)    Upset stomach   Metformin And Related Nausea And Vomiting and Other (See  Comments)    Stomach pains, also   Other Nausea Only and Other (See Comments)    Lettuce- Does not digest this!!   Plaquenil [Hydroxychloroquine  Sulfate] Hives   Shellfish-Derived Products Nausea Only and Other (See Comments)    Caused an upset stomach   Shrimp (Diagnostic) Nausea Only and Other (See Comments)    Upset stomach    Sulfa Antibiotics Hives    Objective:    Vital Signs:   Temp:  [97.7 F (36.5 C)-98.7 F (37.1 C)] 97.8 F (36.6 C) (07/26 0721) Pulse Rate:  [60-81] 60 (07/26 0401) Resp:  [13-20] 17 (07/26 0721) BP: (125-169)/(50-87) 147/50 (07/26 0721) SpO2:  [95 %-100 %] 95 % (07/26 0401) Weight:  [106.5 kg] 106.5 kg (07/26 0401) Last BM Date: 07/09/21  Weight change: Filed Weights   07/09/21 0500 07/10/21 0441 07/11/21 0401  Weight: 108.6 kg 106.6 kg 106.5 kg    Intake/Output:   Intake/Output Summary (Last 24 hours) at 07/11/2021 0855 Last data filed at 07/11/2021 0600 Gross per 24 hour  Intake 336.54 ml  Output 2050 ml  Net -1713.46 ml      Physical Exam    General:  Well appearing, moderately obese, siting up in chair. No resp difficulty HEENT: normal, dentition in poor repair  Neck: supple. JVP elevated to jaw . Carotids 2+ bilat; + left bruit. No lymphadenopathy or thyromegaly appreciated. Cor: PMI nondisplaced. Regular rate & rhythm. No rubs, gallops or murmurs. Lungs: decreased BS at the bases bilaterally  Abdomen: soft, nontender, nondistended. No hepatosplenomegaly. No bruits or masses. Good bowel sounds. Extremities: no cyanosis, clubbing, rash, 2+ bilateral LE edema Neuro: alert & orientedx3, cranial nerves grossly intact. moves all 4 extremities w/o difficulty. Affect pleasant   Telemetry   NSR 90s   EKG    Admit EKG NSR w/ nonspecific Twave abnormality 71 bpm   Labs   Basic Metabolic Panel: Recent Labs  Lab 07/07/21 1530 07/08/21 0011 07/09/21 0039 07/10/21 0006 07/11/21 0054  NA 138 141 140 137 137  K 4.1 4.0 4.0 4.0  4.2  CL 104 106 106 104 102  CO2 $Re'28 28 25 26 24  'wHR$ GLUCOSE 180* 226* 112* 127* 125*  BUN 25* 26* 26* 29* 35*  CREATININE 2.56* 2.71* 2.66* 2.81* 2.86*  CALCIUM 8.6* 8.8* 8.7* 8.8* 9.0  MG  --   --   --   --  2.1    Liver Function Tests: No results for input(s): AST, ALT, ALKPHOS, BILITOT, PROT, ALBUMIN in the last 168 hours. No results for input(s): LIPASE, AMYLASE in the last 168 hours. No results for input(s): AMMONIA in the last 168 hours.  CBC: Recent Labs  Lab 07/07/21 1530 07/09/21 0039  WBC 9.4 7.6  NEUTROABS 7.2  --   HGB 9.0* 8.4*  HCT 30.5* 28.0*  MCV 94.4 94.3  PLT 317 280    Cardiac Enzymes: No results for input(s): CKTOTAL, CKMB, CKMBINDEX, TROPONINI in the last 168 hours.  BNP: BNP (last 3 results) Recent Labs    05/28/21 0105 06/10/21 1210 07/07/21 1530  BNP 785.3* 511.7* 633.1*    ProBNP (last 3 results) No results for input(s): PROBNP in the last 8760 hours.   CBG: Recent Labs  Lab 07/10/21 0621 07/10/21 1104 07/10/21 1601 07/10/21 2131 07/11/21 0633  GLUCAP 106* 136* 120* 162* 118*    Coagulation Studies: No results for input(s): LABPROT, INR in the last 72 hours.   Imaging   No results found.   Medications:     Current Medications:  amLODipine  5 mg Oral Daily   aspirin EC  81 mg Oral Daily   DULoxetine  20 mg Oral Daily  heparin  5,000 Units Subcutaneous Q8H   insulin aspart  0-20 Units Subcutaneous TID WC   metoprolol tartrate  50 mg Oral BID   mirtazapine  15 mg Oral QHS   risperiDONE  3 mg Oral QHS   sodium chloride flush  3 mL Intravenous Q12H    Infusions:  sodium chloride Stopped (07/08/21 2102)   furosemide (LASIX) 200 mg in dextrose 5% 100 mL ($Remov'2mg'HcajMg$ /mL) infusion 8 mg/hr (07/10/21 2300)      Patient Profile   60 y/o female w/ chronic diastolic heart failure, mild nonobstructive CAD by recent cath, HTN, T2DM, h/o CVA, carotid artery disease and Stage IV CKD admitted w/ a/c CHF (3rd admission for CHF in the  last 2 months).   Assessment/Plan   1. Acute on Chronic Diastolic Heart Failure:  - multiple readmits for a/c CHF in last 2 months.  - Echos  12/20: EF 55-60%, RV normal. No LVH. No significant valvular dysfunction  4/22: EF 60-65%, GIIDD, mild LVH, RV normal.  6/22: EF 55-60%, RV normal  - R/LHC 5/22: mRAP 23, mRVSP 51, mPCWP 27, LVEDP 33. CO 5L/min, CI 2.5 L/min/m. Mild non obstructive CAD  - diuresing w/ lasix gtt but remains fluid overloaded - apply UNNA boots - c/w lasix gtt, increase to 10/hr  - will need repeat RHC and consideration for CardioMEMs placement for close outpatient volume management to help reduce readmissions  - GFR too low for Jardiance  - no Entresto nor spiro w/ Stage IV CKD  - needs better BP control    2. Hypertension  - BPs have been poorly controlled at home, reports avg SBPs ~180s - meds adjusted this admit  - on amlodipine 5 + Lopressor 50 bid, SBPs now ~140s-160s  - consider switching ? blocker  to Coreg for better BP control  - can further titrate amlodipine as needed  - reports h/o snoring, needs outpatient sleep eval   3. Stage IV CKD  - followed by Dr. Hollie Salk  - Renal US 6/21 normal  - SCr has ranged from 2-3 last several months  - 2.6 on admit, bumped to 2.9 w/ diuresis  - follow BMP   4. T2DM  - Hgb A1c 7.1  - c/w insulin   5. Carotid Artery Disease  - Dopplers 3/22 w/ occluded Rt ICA and mod Lt ICA disease (40-59%)  - followed by Dr. Gwenlyn Found  - on ASA  - recommend statin, start atorva 40    6. CAD  - nonobstructive single vessel dz by recent cath 5/22  - ost-proxLAD 40%, mLAD 50%, m-dLAD 40%  - RCA and LCx normal  - c/w ASA  - recommend statin w/ LDL goal < 70. Start atorva 40  - BP control per above   Length of Stay: [address] PA-C  07/11/2021, 8:55 AM  Advanced Heart Failure Team Pager [phone] (M-F; 7a - 5p)  Please contact [facility] for night-coverage after hours (4p -7a ) and weekends on  [email or web address]  Patient seen with PA, agree with the above note .  See history above, she has had multiple admissions with diastolic CHF.  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  Last echo in 6/22 showed EF 55-60%, moderate LVH, normal RV, dilated IVC.  Complicated by CKD stage IV, creatinine 2.86 today.  She was admitted with recurrent volume overload.   General: NAD Neck: JVP 12 cm, no thyromegaly or thyroid nodule.  Lungs: Clear to auscultation bilaterally with normal respiratory effort. CV: Nondisplaced  PMI.  Heart regular S1/S2, no S3/S4, no murmur.  2+ edema to knees.   Abdomen: Soft, nontender, no hepatosplenomegaly, no distention.  Skin: Intact without lesions or rashes.  Neurologic: Alert and oriented x 3.  Psych: Normal affect. Extremities: No clubbing or cyanosis.  HEENT: Normal.   1. Acute on chronic diastolic CHF: Last echo in 6/22 showed EF 55-60%, moderate LVH, normal RV, dilated IVC.  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  Multiple admissions recently with CHF, complicated by CKD stage IV.  She is volume overloaded on exam.  Creatinine stable at 2.86.  - Increase Lasix gtt to 12 mg/hr and give metolazone 2.5 x 1.  - Follow creatinine closely.  - PYP scan, myeloma panel, urine immunofixation to rule out cardiac amyloidosis.  - Will probably need RHC prior to discharge, and would likely benefit from [place].  2. HTN: BP still elevated.  - Continue amlodipine.  - Stop metoprolol, start Coreg 12.5 mg bid.  3. CKD stage IV: Creatinine stable at 2.86, follow closely.  4. Carotid disease: Occluded RICA.  - Continue ASA, statin.   Loralie Champagne 07/11/2021 3:10 PM

## 2021-07-11 NOTE — Telephone Encounter (Signed)
Contacted pt to go over lab results pt didn't answer lvm   Sent a CRM and forward labs to NT to give pt labs when they call back   

## 2021-07-11 NOTE — Progress Notes (Signed)
Orthopedic Tech Progress Note Patient Details:  Nancy [name] 08-Dec-1961 [id number]  Ortho Devices Type of Ortho Device: Haematologist Ortho Device/Splint Location: BLE Ortho Device/Splint Interventions: Ordered, Application, Adjustment   Post Interventions Patient Tolerated: Well Instructions Provided: Care of [name] 07/11/2021, 5:36 PM

## 2021-07-11 NOTE — TOC Initial Note (Signed)
Transition of Care (TOC) - Initial/Assessment Note  Heart Failure   Patient Details  Name: Nancy [name] MRN: [id number] Date of Birth: 06-28-1961  Transition of Care [facility]) CM/SW Contact:    [place], [place] Phone Number: 07/11/2021, 12:21 PM  Clinical Narrative:                 CSW spoke with the patient at bedside and completed a very brief SDOH with the patient who reported that she applied for Medicaid and disability at her last hospital visit and she is waiting to get approved. CSW provided Ms. Fatheree with a CAFA application due to her having no health insurance. CSW obtained the patients signature for a Food Stamp application as she reported that would be helpful for her.   CSW will continue to follow throughout discharge.   Barriers to Discharge: Continued Medical Work up   Patient Goals and CMS Choice        Expected Discharge Plan and Services   In-house Referral: Clinical Social Work     Living arrangements for the past 2 months: Apartment                                      Prior Living Arrangements/Services Living arrangements for the past 2 months: Apartment Lives with:: Self Patient language and need for interpreter reviewed:: Yes        Need for Family Participation in Patient Care: No (Comment) Care giver support system in place?: No (comment)   Criminal Activity/Legal Involvement Pertinent to Current Situation/Hospitalization: No - Comment as needed  Activities of Daily Living Home Assistive Devices/Equipment: CBG Meter, Cane (specify quad or straight) ADL Screening (condition at time of admission) Patient's cognitive ability adequate to safely complete daily activities?: Yes Is the patient deaf or have difficulty hearing?: No Does the patient have difficulty seeing, even when wearing glasses/contacts?: No Does the patient have difficulty concentrating, remembering, or making decisions?: No Patient able to express need for  assistance with ADLs?: Yes Does the patient have difficulty dressing or bathing?: Yes Independently performs ADLs?: No Does the patient have difficulty walking or climbing stairs?: Yes Weakness of Legs: Both Weakness of Arms/Hands: None  Permission Sought/Granted                  Emotional Assessment Appearance:: Appears stated age Attitude/Demeanor/Rapport: Engaged Affect (typically observed): Pleasant Orientation: : Oriented to Self, Oriented to Place, Oriented to  Time, Oriented to Situation   Psych Involvement: No (comment)  Admission diagnosis:  Acute on chronic diastolic heart failure (HCC) [I50.33] CHF (congestive heart failure) ([place]) [I50.9] Normocytic anemia [D64.9] Renal insufficiency [N28.9] Patient Active Problem List   Diagnosis Date Noted   CHF (congestive heart failure) ([place]) 07/07/2021   Normocytic anemia 06/30/2021   CKD (chronic kidney disease), stage IV ([place]) 45/36/4680   Acute diastolic CHF (congestive heart failure) ([place]) 06/10/2021   Abnormal nuclear stress test    Dyspnea on exertion 03/07/2021   Orthopnea 02/25/2021   History of cerebrovascular accident (CVA) with residual deficit 05/06/2020   Diabetic peripheral neuropathy ([place]) 04/26/2020   ARF (acute renal failure) ([place]) 04/20/2020   Generalized weakness 04/20/2020   Dehydration 04/20/2020   Generalized abdominal pain    Pancreatitis, acute 02/29/2020   Cerebrovascular accident (CVA) ([place]) 11/08/2019   Stenosis of right carotid artery 11/08/2019   Hyperlipidemia associated with type 2 diabetes mellitus ([place]) 11/08/2019  Cortical age-related cataract of both eyes 08/30/2019   Gait abnormality 08/30/2019   Statin declined 08/30/2019   Gastroesophageal reflux disease without esophagitis 04/18/2018   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   Uncontrolled type 2 diabetes mellitus with diabetic neuropathy, with long-term current use of insulin ([place]) 10/29/2017   History of macular degeneration 10/29/2017    Chronic cervical pain 10/29/2016   Myalgia 09/14/2016   Insomnia 09/14/2016   Tinea pedis of both feet 08/20/2016   Macular degeneration 08/17/2016   Low back pain 09/21/2014   Hypertension 09/21/2014   Diabetic gastroparesis associated with type 2 diabetes mellitus ([name]) 09/14/2014   Thyroid nodule 08/17/2014   Obesity (BMI 30-39.9) 08/17/2014   Hyperlipidemia 08/05/2014   Nausea & vomiting 08/04/2014   Type II diabetes mellitus with neurological manifestations, uncontrolled ([place]) 08/04/2014   PCP:  [name], MD Pharmacy:   [facility] [place] [address] [place] [address] Phone: [phone] Fax: [phone]     Social Determinants of Health (SDOH) Interventions Food Insecurity Interventions: Assist with SNAP Application Financial Strain Interventions: Development worker, community (Referral to [employer]) Housing Interventions: Intervention Not Indicated Transportation Interventions: Intervention Not Indicated  Readmission Risk Interventions Readmission Risk Prevention Plan 03/07/2020  Transportation Screening Complete  PCP or Specialist Appt within 3-5 Days Complete  HRI or [place] (No Data)  Social Work Consult for [place] Planning/Counseling [place] Not Applicable  Medication Review Press photographer) Complete  Some recent data might be hidden   [place], MSW, [place] Social Worker

## 2021-07-12 ENCOUNTER — Inpatient Hospital Stay (HOSPITAL_COMMUNITY): Payer: Medicaid Other

## 2021-07-12 LAB — BASIC METABOLIC PANEL
Anion gap: 9 (ref 5–15)
BUN: 42 mg/dL — ABNORMAL HIGH (ref 8–23)
CO2: 28 mmol/L (ref 22–32)
Calcium: 8.5 mg/dL — ABNORMAL LOW (ref 8.9–10.3)
Chloride: 101 mmol/L (ref 98–111)
Creatinine, Ser: 2.93 mg/dL — ABNORMAL HIGH (ref 0.44–1.00)
GFR, Estimated: 18 mL/min — ABNORMAL LOW (ref >60)
Glucose, Bld: 177 mg/dL — ABNORMAL HIGH (ref 70–99)
Potassium: 3.9 mmol/L (ref 3.5–5.1)
Sodium: 138 mmol/L (ref 135–145)

## 2021-07-12 LAB — GLUCOSE, CAPILLARY
Glucose-Capillary: 126 mg/dL — ABNORMAL HIGH (ref 70–99)
Glucose-Capillary: 140 mg/dL — ABNORMAL HIGH (ref 70–99)
Glucose-Capillary: 173 mg/dL — ABNORMAL HIGH (ref 70–99)
Glucose-Capillary: 152 mg/dL — ABNORMAL HIGH (ref 70–99)

## 2021-07-12 LAB — HEMOGLOBIN A1C

## 2021-07-12 MED ORDER — TECHNETIUM TC 99M PYROPHOSPHATE
19.2000 | Freq: Once | INTRAVENOUS | Status: AC | PRN
  Administered 2021-07-12: 19.2 via INTRAVENOUS
  Filled 2021-07-12: qty 20

## 2021-07-12 MED ORDER — METOLAZONE 2.5 MG PO TABS
2.5000 mg | ORAL_TABLET | Freq: Once | ORAL | Status: AC
  Administered 2021-07-12: 2.5 mg via ORAL
  Filled 2021-07-12: qty 1

## 2021-07-12 MED ORDER — POTASSIUM CHLORIDE CRYS ER 20 MEQ PO TBCR
40.0000 meq | EXTENDED_RELEASE_TABLET | Freq: Once | ORAL | Status: AC
  Administered 2021-07-12: 40 meq via ORAL
  Filled 2021-07-12: qty 2

## 2021-07-12 NOTE — TOC Initial Note (Addendum)
Transition of Care [facility]) - Initial/Assessment Note    Patient Details  Name: Nancy [name] MRN: [id number] Date of Birth: 1961-02-04  Transition of Care [facility]) CM/SW Contact:    Erenest Rasher, RN Phone Number: [phone]  07/12/2021, 4:00 PM  Clinical Narrative:                 TOC CM spoke to pt and states she lives at home with husband. Husband able to assist with transportation. She uses the [facility] for meds. She has DME at home. Also has a scale. Pt states she is having difficult with sleeping at night because when she lies flat in her bed she begins to cough. Discussed wedge pillow to elevate head. [place] for cost. Pt states she has pill planner at home and monitors her sodium intake. Review she had 3 IP admission and 2 Ed visits in past six months.   Message sent to attending, Dr Gwenlyn Found, order given for pt to sleep with wedge pillow. [place] for price, wedge pillow is an oop purchase $25. Sent request to HF CSW for approval to assist and approval received. Waiting [place] to get back with delivery.   Expected Discharge Plan: Home/Self Care Barriers to Discharge: Continued Medical Work up   Patient Goals and CMS Choice        Expected Discharge Plan and Services Expected Discharge Plan: Home/Self Care In-house Referral: Clinical Social Work Discharge Planning Services: CM Consult, Medication Assistance Post Acute Care Choice: Durable Medical Equipment Living arrangements for the past 2 months: Apartment                                      Prior Living Arrangements/Services Living arrangements for the past 2 months: Apartment Lives with:: Spouse Patient language and need for interpreter reviewed:: Yes Do you feel safe going back to the place where you live?: Yes      Need for Family Participation in Patient Care: Yes (Comment) Care giver support system in place?: Yes (comment) Current home  services: DME (cane, rolling walker, bedside commode) Criminal Activity/Legal Involvement Pertinent to Current Situation/Hospitalization: No - Comment as needed  Activities of Daily Living Home Assistive Devices/Equipment: CBG Meter, Cane (specify quad or straight) ADL Screening (condition at time of admission) Patient's cognitive ability adequate to safely complete daily activities?: Yes Is the patient deaf or have difficulty hearing?: No Does the patient have difficulty seeing, even when wearing glasses/contacts?: No Does the patient have difficulty concentrating, remembering, or making decisions?: No Patient able to express need for assistance with ADLs?: Yes Does the patient have difficulty dressing or bathing?: Yes Independently performs ADLs?: No Does the patient have difficulty walking or climbing stairs?: Yes Weakness of Legs: Both Weakness of Arms/Hands: None  Permission Sought/Granted Permission sought to share information with : Case Manager, PCP, Family Supports Permission granted to share information with : Yes, Verbal Permission Granted  Share Information with NAME: Ahmiya Abee     Permission granted to share info w Relationship: husband  Permission granted to share info w Contact Information: [phone]  Emotional Assessment Appearance:: Appears stated age Attitude/Demeanor/Rapport: Engaged Affect (typically observed): Accepting Orientation: : Oriented to Self, Oriented to Place, Oriented to  Time, Oriented to Situation   Psych Involvement: No (comment)  Admission diagnosis:  Acute on chronic diastolic heart failure (HCC) [I50.33] CHF (congestive heart  failure) ([place]) [I50.9] Normocytic anemia [D64.9] Renal insufficiency [N28.9] Patient Active Problem List   Diagnosis Date Noted   CHF (congestive heart failure) ([place]) 07/07/2021   Normocytic anemia 06/30/2021   CKD (chronic kidney disease), stage IV (HCC) 37/48/2707   Acute diastolic CHF (congestive heart  failure) ([place]) 06/10/2021   Abnormal nuclear stress test    Dyspnea on exertion 03/07/2021   Orthopnea 02/25/2021   History of cerebrovascular accident (CVA) with residual deficit 05/06/2020   Diabetic peripheral neuropathy ([place]) 04/26/2020   ARF (acute renal failure) ([place]) 04/20/2020   Generalized weakness 04/20/2020   Dehydration 04/20/2020   Generalized abdominal pain    Pancreatitis, acute 02/29/2020   Cerebrovascular accident (CVA) ([place]) 11/08/2019   Stenosis of right carotid artery 11/08/2019   Hyperlipidemia associated with type 2 diabetes mellitus ([place]) 11/08/2019   Cortical age-related cataract of both eyes 08/30/2019   Gait abnormality 08/30/2019   Statin declined 08/30/2019   Gastroesophageal reflux disease without esophagitis 04/18/2018   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) 10/29/2017   History of macular degeneration 10/29/2017   Chronic cervical pain 10/29/2016   Myalgia 09/14/2016   Insomnia 09/14/2016   Tinea pedis of both feet 08/20/2016   Macular degeneration 08/17/2016   Low back pain 09/21/2014   Hypertension 09/21/2014   Diabetic gastroparesis associated with type 2 diabetes mellitus ([place]) 09/14/2014   Thyroid nodule 08/17/2014   Obesity (BMI 30-39.9) 08/17/2014   Hyperlipidemia 08/05/2014   Nausea & vomiting 08/04/2014   Type II diabetes mellitus with neurological manifestations, uncontrolled ([place]) 08/04/2014   PCP:  [name], MD Pharmacy:   [facility] [address] [place] [address] Phone: [phone] Fax: [phone]     Social Determinants of Health (SDOH) Interventions Food Insecurity Interventions: Assist with SNAP Application Financial Strain Interventions: Development worker, community (Referral to [employer]) Housing Interventions: Intervention Not Indicated Transportation Interventions:  Intervention Not Indicated  Readmission Risk Interventions Readmission Risk Prevention Plan 03/07/2020  Transportation Screening Complete  PCP or Specialist Appt within 3-5 Days Complete  HRI or [place] (No Data)  Social Work Consult for [place] Planning/Counseling [place] Not Applicable  Medication Review Press photographer) Complete  Some recent data might be hidden

## 2021-07-12 NOTE — Progress Notes (Signed)
Patient ID: Nancy [name], female   DOB: 1960-12-23, 60 y.o.   MRN: [id number]     Advanced Heart Failure Rounding Note  PCP-Cardiologist: Quay Burow, MD   Subjective:    Good diuresis yesterday, weight down 2 lbs but creatinine mildly higher 2.86 => 2.93.     Objective:   Weight Range: 105.4 kg Body mass index is 39.89 kg/m.   Vital Signs:   Temp:  [97.7 F (36.5 C)-98.1 F (36.7 C)] 97.7 F (36.5 C) (07/27 0400) Pulse Rate:  [60-75] 60 (07/27 0400) Resp:  [12-15] 13 (07/27 0400) BP: (128-161)/(66-90) 161/77 (07/27 0400) SpO2:  [95 %-98 %] 98 % (07/27 0400) Weight:  [105.4 kg] 105.4 kg (07/27 0600) Last BM Date: 07/09/21  Weight change: Filed Weights   07/10/21 0441 07/11/21 0401 07/12/21 0600  Weight: 106.6 kg 106.5 kg 105.4 kg    Intake/Output:   Intake/Output Summary (Last 24 hours) at 07/12/2021 0947 Last data filed at 07/12/2021 0600 Gross per 24 hour  Intake --  Output 2075 ml  Net -2075 ml      Physical Exam    General:  Well appearing. No resp difficulty HEENT: Normal Neck: Supple. JVP 14 cm. Carotids 2+ bilat; no bruits. No lymphadenopathy or thyromegaly appreciated. Cor: PMI nondisplaced. Regular rate & rhythm. No rubs, gallops or murmurs. Lungs: Clear Abdomen: Soft, nontender, nondistended. No hepatosplenomegaly. No bruits or masses. Good bowel sounds. Extremities: No cyanosis, clubbing, rash.  2+ edema to thighs.  Neuro: Alert & orientedx3, cranial nerves grossly intact. moves all 4 extremities w/o difficulty. Affect pleasant   Telemetry   NSR (personally reviewed)  Labs    CBC No results for input(s): WBC, NEUTROABS, HGB, HCT, MCV, PLT in the last 72 hours. Basic Metabolic Panel Recent Labs    07/11/21 0054 07/12/21 0019  NA 137 138  K 4.2 3.9  CL 102 101  CO2 24 28  GLUCOSE 125* 177*  BUN 35* 42*  CREATININE 2.86* 2.93*  CALCIUM 9.0 8.5*  MG 2.1  --    Liver Function Tests No results for input(s): AST, ALT,  ALKPHOS, BILITOT, PROT, ALBUMIN in the last 72 hours. No results for input(s): LIPASE, AMYLASE in the last 72 hours. Cardiac Enzymes No results for input(s): CKTOTAL, CKMB, CKMBINDEX, TROPONINI in the last 72 hours.  BNP: BNP (last 3 results) Recent Labs    05/28/21 0105 06/10/21 1210 07/07/21 1530  BNP 785.3* 511.7* 633.1*    ProBNP (last 3 results) No results for input(s): PROBNP in the last 8760 hours.   D-Dimer No results for input(s): DDIMER in the last 72 hours. Hemoglobin A1C No results for input(s): HGBA1C in the last 72 hours. Fasting Lipid Panel Recent Labs    07/11/21 0054  CHOL 169  HDL 52  LDLCALC 99  TRIG 88  CHOLHDL 3.3   Thyroid Function Tests No results for input(s): TSH, T4TOTAL, T3FREE, THYROIDAB in the last 72 hours.  Invalid input(s): FREET3  Other results:   Imaging    No results found.   Medications:     Scheduled Medications:  amLODipine  5 mg Oral Daily   aspirin EC  81 mg Oral Daily   atorvastatin  40 mg Oral Daily   carvedilol  12.5 mg Oral BID WC   DULoxetine  20 mg Oral Daily   heparin  5,000 Units Subcutaneous Q8H   insulin aspart  0-20 Units Subcutaneous TID WC   metolazone  2.5 mg Oral Once   mirtazapine  15 mg Oral QHS   potassium chloride  40 mEq Oral Once   risperiDONE  3 mg Oral QHS   sodium chloride flush  3 mL Intravenous Q12H    Infusions:  sodium chloride Stopped (07/08/21 2102)   furosemide (LASIX) 200 mg in dextrose 5% 100 mL (2mg /mL) infusion 12 mg/hr (07/12/21 0644)    PRN Medications: sodium chloride, acetaminophen, ondansetron (ZOFRAN) IV, sodium chloride flush   Assessment/Plan   1. Acute on chronic diastolic CHF: Last echo in 6/22 showed EF 55-60%, moderate LVH, normal RV, dilated IVC.  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  Multiple admissions recently with CHF, complicated by CKD stage IV.  She is volume overloaded on exam. Creatinine mildly higher 2.86 => 2.93.  She diuresed reasonably  yesterday.  - Continue Lasix gtt 12 mg/hr and give metolazone 2.5 x 1.  Concerned we will be limited by renal function.  - Follow creatinine closely. - PYP scan, myeloma panel, urine immunofixation to rule out cardiac amyloidosis. - RHC this admission, possibly Friday.  - Would likely benefit from [place]. 2. HTN: BP still elevated. - Continue amlodipine. - Have replaced metoprolol with Coreg.  3. CKD stage IV: Creatinine mildly higher at 2.93, follow closely. 4. Carotid disease: Occluded RICA. - Continue ASA, statin.  Length of Stay: [place], MD  07/12/2021, 9:47 AM  Advanced Heart Failure Team Pager [phone] (M-F; 7a - 5p)  Please contact [facility] for night-coverage after hours (5p -7a ) and weekends on [email or web address]

## 2021-07-13 LAB — BASIC METABOLIC PANEL
Anion gap: 7 (ref 5–15)
BUN: 47 mg/dL — ABNORMAL HIGH (ref 8–23)
CO2: 30 mmol/L (ref 22–32)
Calcium: 8.9 mg/dL (ref 8.9–10.3)
Chloride: 100 mmol/L (ref 98–111)
Creatinine, Ser: 3.07 mg/dL — ABNORMAL HIGH (ref 0.44–1.00)
GFR, Estimated: 17 mL/min — ABNORMAL LOW (ref >60)
Glucose, Bld: 121 mg/dL — ABNORMAL HIGH (ref 70–99)
Potassium: 4.3 mmol/L (ref 3.5–5.1)
Sodium: 137 mmol/L (ref 135–145)

## 2021-07-13 LAB — GLUCOSE, CAPILLARY
Glucose-Capillary: 127 mg/dL — ABNORMAL HIGH (ref 70–99)
Glucose-Capillary: 126 mg/dL — ABNORMAL HIGH (ref 70–99)
Glucose-Capillary: 205 mg/dL — ABNORMAL HIGH (ref 70–99)
Glucose-Capillary: 148 mg/dL — ABNORMAL HIGH (ref 70–99)

## 2021-07-13 LAB — IMMUNOFIXATION, URINE

## 2021-07-13 MED ORDER — SODIUM CHLORIDE 0.9 % IV SOLN: INTRAVENOUS | Status: DC

## 2021-07-13 MED ORDER — CARVEDILOL 6.25 MG PO TABS
18.7500 mg | ORAL_TABLET | Freq: Two times a day (BID) | ORAL | Status: DC
  Administered 2021-07-13 – 2021-07-16 (×6): 18.75 mg via ORAL
  Filled 2021-07-13 (×6): qty 1

## 2021-07-13 MED ORDER — ASPIRIN 81 MG PO CHEW
81.0000 mg | CHEWABLE_TABLET | ORAL | Status: AC
Start: 2021-07-14 — End: 2021-07-14
  Administered 2021-07-14: 81 mg via ORAL
  Filled 2021-07-13: qty 1

## 2021-07-13 MED ORDER — SODIUM CHLORIDE 0.9 % IV SOLN: 250.0000 mL | INTRAVENOUS | Status: DC | PRN

## 2021-07-13 MED ORDER — SODIUM CHLORIDE 0.9% FLUSH: 3.0000 mL | INTRAVENOUS | Status: DC | PRN

## 2021-07-13 MED ORDER — SODIUM CHLORIDE 0.9% FLUSH
3.0000 mL | Freq: Two times a day (BID) | INTRAVENOUS | Status: DC
Start: 2021-07-13 — End: 2021-07-23
  Administered 2021-07-13 – 2021-07-23 (×15): 3 mL via INTRAVENOUS

## 2021-07-13 NOTE — Progress Notes (Addendum)
Patient ID: Nancy [name], female   DOB: [birth year], 60 y.o.   MRN: [id number]     Advanced Heart Failure Rounding Note  PCP-Cardiologist: Quay Burow, MD   Subjective:     Brisk diuresis noted with lasix drip + metolazone. Weight trending down another 8 pounds.    Feels ok today.   Objective:   Weight Range: 101.8 kg Body mass index is 38.52 kg/m.   Vital Signs:   Temp:  [97.5 F (36.4 C)-98.5 F (36.9 C)] 98.5 F (36.9 C) (07/28 0753) Pulse Rate:  [68-80] 71 (07/28 0753) Resp:  [12-19] 12 (07/28 0753) BP: (137-169)/(70-93) 144/73 (07/28 0753) SpO2:  [90 %-99 %] 97 % (07/28 0753) Weight:  [101.8 kg] 101.8 kg (07/28 0355) Last BM Date: 07/09/21  Weight change: Filed Weights   07/11/21 0401 07/12/21 0600 07/13/21 0355  Weight: 106.5 kg 105.4 kg 101.8 kg    Intake/Output:   Intake/Output Summary (Last 24 hours) at 07/13/2021 0837 Last data filed at 07/13/2021 0715 Gross per 24 hour  Intake 600 ml  Output 3350 ml  Net -2750 ml      Physical Exam    General:  . In bed. No resp difficulty HEENT: normal Neck: supple. no JVD. Carotids 2+ bilat; no bruits. No lymphadenopathy or thryomegaly appreciated. Cor: PMI nondisplaced. Regular rate & rhythm. No rubs, gallops or murmurs. Lungs: clear Abdomen: soft, nontender, nondistended. No hepatosplenomegaly. No bruits or masses. Good bowel sounds. Extremities: no cyanosis, clubbing, rash, R and LLE 1+ with unna boots.  Neuro: alert & orientedx3, cranial nerves grossly intact. moves all 4 extremities w/o difficulty. Affect pleasant   Telemetry   NSR 60-80s   Labs    CBC No results for input(s): WBC, NEUTROABS, HGB, HCT, MCV, PLT in the last 72 hours. Basic Metabolic Panel Recent Labs    07/11/21 0054 07/12/21 0019 07/13/21 0020  NA 137 138 137  K 4.2 3.9 4.3  CL 102 101 100  CO2 24 28 30   GLUCOSE 125* 177* 121*  BUN 35* 42* 47*  CREATININE 2.86* 2.93* 3.07*  CALCIUM 9.0 8.5* 8.9  MG 2.1  --   --     Liver Function Tests No results for input(s): AST, ALT, ALKPHOS, BILITOT, PROT, ALBUMIN in the last 72 hours. No results for input(s): LIPASE, AMYLASE in the last 72 hours. Cardiac Enzymes No results for input(s): CKTOTAL, CKMB, CKMBINDEX, TROPONINI in the last 72 hours.  BNP: BNP (last 3 results) Recent Labs    05/28/21 0105 06/10/21 1210 07/07/21 1530  BNP 785.3* 511.7* 633.1*    ProBNP (last 3 results) No results for input(s): PROBNP in the last 8760 hours.   D-Dimer No results for input(s): DDIMER in the last 72 hours. Hemoglobin A1C No results for input(s): HGBA1C in the last 72 hours. Fasting Lipid Panel Recent Labs    07/11/21 0054  CHOL 169  HDL 52  LDLCALC 99  TRIG 88  CHOLHDL 3.3   Thyroid Function Tests No results for input(s): TSH, T4TOTAL, T3FREE, THYROIDAB in the last 72 hours.  Invalid input(s): FREET3  Other results:   Imaging    NM CARDIAC AMYLOID TUMOR LOC INFLAM SPECT 1 DAY  Result Date: 07/12/2021 CLINICAL DATA:  HEART FAILURE. CONCERN FOR CARDIAC AMYLOIDOSIS. EXAM: NUCLEAR MEDICINE TUMOR LOCALIZATION. PYP CARDIAC AMYLOIDOSIS SCAN WITH SPECT TECHNIQUE: Following intravenous administration of radiopharmaceutical, anterior planar images of the chest were obtained. Regions of interest were placed on the heart and contralateral chest wall for quantitative assessment.  Additional SPECT imaging of the chest was obtained. RADIOPHARMACEUTICALS:  19.2 mCi TECHNETIUM 99 PYROPHOSPHATE FINDINGS: Planar Visual assessment: Anterior planar imaging demonstrates radiotracer uptake within the heart less than than uptake within the adjacent ribs (Grade 1). Quantitative assessment : Quantitative assessment of the cardiac uptake compared to the contralateral chest wall is equal to(H/CL = 1.0). SPECT assessment: SPECT imaging of the chest demonstrates equivocal radiotracer accumulation within the LEFT ventricle. 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 Electronically Signed   By: Kerby Moors M.D.   On: 07/12/2021 17:03     Medications:     Scheduled Medications:  amLODipine  5 mg Oral Daily   aspirin EC  81 mg Oral Daily   atorvastatin  40 mg Oral Daily   carvedilol  12.5 mg Oral BID WC   DULoxetine  20 mg Oral Daily   heparin  5,000 Units Subcutaneous Q8H   insulin aspart  0-20 Units Subcutaneous TID WC   mirtazapine  15 mg Oral QHS   risperiDONE  3 mg Oral QHS   sodium chloride flush  3 mL Intravenous Q12H    Infusions:  sodium chloride Stopped (07/08/21 2102)   furosemide (LASIX) 200 mg in dextrose 5% 100 mL (2mg /mL) infusion 12 mg/hr (07/12/21 2342)    PRN Medications: sodium chloride, acetaminophen, ondansetron (ZOFRAN) IV, sodium chloride flush   Assessment/Plan   1. Acute on chronic diastolic CHF: Last echo in 6/22 showed EF 55-60%, moderate LVH, normal RV, dilated IVC.  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  Multiple admissions recently with CHF, complicated by CKD stage IV.   Volume status much improved. Will stop lasix drip. Creatinine mildly higher 2.86 => 2.93=>3.07.    -Concerned we will be limited by renal function.  - Stop Lasix today.  - PYP scan, myeloma panel, urine immunofixation to rule out cardiac amyloidosis => PYP scan not suggestive of TTR cardiac amyloidosis. - RHC tomorrow. Discussed with  - Would likely benefit from [place]. 2. HTN: BP still elevated. - Continue amlodipine. - Have replaced metoprolol with Coreg.  3. CKD stage IV: Creatinine mildly higher at 3.07, follow closely. 4. Carotid disease: Occluded RICA. - Continue ASA, statin.   Set up [place] for tomorrow at 0900 Length of Stay: [place], NP  07/13/2021, 8:37 AM  Advanced Heart Failure Team Pager [phone] (M-F; 7a - 5p)  Please contact [facility] for night-coverage after hours (5p -7a ) and weekends on [email or web address]  Patient seen with NP, agree  with the above note.   Good diuresis yesterday, weight down 8 lbs.  Creatinine higher at 3.07.  BP still high.   General: NAD Neck: No JVD, no thyromegaly or thyroid nodule.  Lungs: Clear to auscultation bilaterally with normal respiratory effort. CV: Nondisplaced PMI.  Heart regular S1/S2, no S3/S4, no murmur.  1+ edema to knees.  Abdomen: Soft, nontender, no hepatosplenomegaly, no distention.  Skin: Intact without lesions or rashes.  Neurologic: Alert and oriented x 3.  Psych: Normal affect. Extremities: No clubbing or cyanosis.  HEENT: Normal.   PYP scan not suggestive of cardiac amyloidosis.   Stop Lasix today, volume status looks better.  With rise in creatinine, hold Lasix today.  Will get RHC tomorrow (discussed risks/benefits with patient and she agrees) and start on torsemide most likely when creatinine has settled down.   Increase Coreg to 18.75 mg bid with ongoing elevated BP.   Loralie Champagne 07/13/2021 9:41  AM

## 2021-07-13 NOTE — Plan of Care (Signed)
  Problem: Clinical Measurements: Goal: Ability to maintain clinical measurements within normal limits will improve Outcome: Not Progressing Goal: Diagnostic test results will improve Outcome: Not Progressing   

## 2021-07-13 NOTE — H&P (View-Only) (Signed)
Patient ID: Nancy [name], female   DOB: [birth year], 60 y.o.   MRN: [id number]     Advanced Heart Failure Rounding Note  PCP-Cardiologist: Quay Burow, MD   Subjective:     Brisk diuresis noted with lasix drip + metolazone. Weight trending down another 8 pounds.    Feels ok today.   Objective:   Weight Range: 101.8 kg Body mass index is 38.52 kg/m.   Vital Signs:   Temp:  [97.5 F (36.4 C)-98.5 F (36.9 C)] 98.5 F (36.9 C) (07/28 0753) Pulse Rate:  [68-80] 71 (07/28 0753) Resp:  [12-19] 12 (07/28 0753) BP: (137-169)/(70-93) 144/73 (07/28 0753) SpO2:  [90 %-99 %] 97 % (07/28 0753) Weight:  [101.8 kg] 101.8 kg (07/28 0355) Last BM Date: 07/09/21  Weight change: Filed Weights   07/11/21 0401 07/12/21 0600 07/13/21 0355  Weight: 106.5 kg 105.4 kg 101.8 kg    Intake/Output:   Intake/Output Summary (Last 24 hours) at 07/13/2021 0837 Last data filed at 07/13/2021 0715 Gross per 24 hour  Intake 600 ml  Output 3350 ml  Net -2750 ml      Physical Exam    General:  . In bed. No resp difficulty HEENT: normal Neck: supple. no JVD. Carotids 2+ bilat; no bruits. No lymphadenopathy or thryomegaly appreciated. Cor: PMI nondisplaced. Regular rate & rhythm. No rubs, gallops or murmurs. Lungs: clear Abdomen: soft, nontender, nondistended. No hepatosplenomegaly. No bruits or masses. Good bowel sounds. Extremities: no cyanosis, clubbing, rash, R and LLE 1+ with unna boots.  Neuro: alert & orientedx3, cranial nerves grossly intact. moves all 4 extremities w/o difficulty. Affect pleasant   Telemetry   NSR 60-80s   Labs    CBC No results for input(s): WBC, NEUTROABS, HGB, HCT, MCV, PLT in the last 72 hours. Basic Metabolic Panel Recent Labs    07/11/21 0054 07/12/21 0019 07/13/21 0020  NA 137 138 137  K 4.2 3.9 4.3  CL 102 101 100  CO2 24 28 30   GLUCOSE 125* 177* 121*  BUN 35* 42* 47*  CREATININE 2.86* 2.93* 3.07*  CALCIUM 9.0 8.5* 8.9  MG 2.1  --   --     Liver Function Tests No results for input(s): AST, ALT, ALKPHOS, BILITOT, PROT, ALBUMIN in the last 72 hours. No results for input(s): LIPASE, AMYLASE in the last 72 hours. Cardiac Enzymes No results for input(s): CKTOTAL, CKMB, CKMBINDEX, TROPONINI in the last 72 hours.  BNP: BNP (last 3 results) Recent Labs    05/28/21 0105 06/10/21 1210 07/07/21 1530  BNP 785.3* 511.7* 633.1*    ProBNP (last 3 results) No results for input(s): PROBNP in the last 8760 hours.   D-Dimer No results for input(s): DDIMER in the last 72 hours. Hemoglobin A1C No results for input(s): HGBA1C in the last 72 hours. Fasting Lipid Panel Recent Labs    07/11/21 0054  CHOL 169  HDL 52  LDLCALC 99  TRIG 88  CHOLHDL 3.3   Thyroid Function Tests No results for input(s): TSH, T4TOTAL, T3FREE, THYROIDAB in the last 72 hours.  Invalid input(s): FREET3  Other results:   Imaging    NM CARDIAC AMYLOID TUMOR LOC INFLAM SPECT 1 DAY  Result Date: 07/12/2021 CLINICAL DATA:  HEART FAILURE. CONCERN FOR CARDIAC AMYLOIDOSIS. EXAM: NUCLEAR MEDICINE TUMOR LOCALIZATION. PYP CARDIAC AMYLOIDOSIS SCAN WITH SPECT TECHNIQUE: Following intravenous administration of radiopharmaceutical, anterior planar images of the chest were obtained. Regions of interest were placed on the heart and contralateral chest wall for quantitative assessment.  Additional SPECT imaging of the chest was obtained. RADIOPHARMACEUTICALS:  19.2 mCi TECHNETIUM 99 PYROPHOSPHATE FINDINGS: Planar Visual assessment: Anterior planar imaging demonstrates radiotracer uptake within the heart less than than uptake within the adjacent ribs (Grade 1). Quantitative assessment : Quantitative assessment of the cardiac uptake compared to the contralateral chest wall is equal to(H/CL = 1.0). SPECT assessment: SPECT imaging of the chest demonstrates equivocal radiotracer accumulation within the LEFT ventricle. 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 Electronically Signed   By: Kerby Moors M.D.   On: 07/12/2021 17:03     Medications:     Scheduled Medications:  amLODipine  5 mg Oral Daily   aspirin EC  81 mg Oral Daily   atorvastatin  40 mg Oral Daily   carvedilol  12.5 mg Oral BID WC   DULoxetine  20 mg Oral Daily   heparin  5,000 Units Subcutaneous Q8H   insulin aspart  0-20 Units Subcutaneous TID WC   mirtazapine  15 mg Oral QHS   risperiDONE  3 mg Oral QHS   sodium chloride flush  3 mL Intravenous Q12H    Infusions:  sodium chloride Stopped (07/08/21 2102)   furosemide (LASIX) 200 mg in dextrose 5% 100 mL (2mg /mL) infusion 12 mg/hr (07/12/21 2342)    PRN Medications: sodium chloride, acetaminophen, ondansetron (ZOFRAN) IV, sodium chloride flush   Assessment/Plan   1. Acute on chronic diastolic CHF: Last echo in 6/22 showed EF 55-60%, moderate LVH, normal RV, dilated IVC.  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  Multiple admissions recently with CHF, complicated by CKD stage IV.   Volume status much improved. Will stop lasix drip. Creatinine mildly higher 2.86 => 2.93=>3.07.    -Concerned we will be limited by renal function.  - Stop Lasix today.  - PYP scan, myeloma panel, urine immunofixation to rule out cardiac amyloidosis => PYP scan not suggestive of TTR cardiac amyloidosis. - RHC tomorrow. Discussed with  - Would likely benefit from [place]. 2. HTN: BP still elevated. - Continue amlodipine. - Have replaced metoprolol with Coreg.  3. CKD stage IV: Creatinine mildly higher at 3.07, follow closely. 4. Carotid disease: Occluded RICA. - Continue ASA, statin.   Set up [place] for tomorrow at 0900 Length of Stay: [place], NP  07/13/2021, 8:37 AM  Advanced Heart Failure Team Pager [phone] (M-F; 7a - 5p)  Please contact [facility] for night-coverage after hours (5p -7a ) and weekends on [email or web address]  Patient seen with NP, agree  with the above note.   Good diuresis yesterday, weight down 8 lbs.  Creatinine higher at 3.07.  BP still high.   General: NAD Neck: No JVD, no thyromegaly or thyroid nodule.  Lungs: Clear to auscultation bilaterally with normal respiratory effort. CV: Nondisplaced PMI.  Heart regular S1/S2, no S3/S4, no murmur.  1+ edema to knees.  Abdomen: Soft, nontender, no hepatosplenomegaly, no distention.  Skin: Intact without lesions or rashes.  Neurologic: Alert and oriented x 3.  Psych: Normal affect. Extremities: No clubbing or cyanosis.  HEENT: Normal.   PYP scan not suggestive of cardiac amyloidosis.   Stop Lasix today, volume status looks better.  With rise in creatinine, hold Lasix today.  Will get RHC tomorrow (discussed risks/benefits with patient and she agrees) and start on torsemide most likely when creatinine has settled down.   Increase Coreg to 18.75 mg bid with ongoing elevated BP.   Loralie Champagne 07/13/2021 9:41  AM

## 2021-07-13 NOTE — Progress Notes (Signed)
Pt resulting in 170s- current 179/82- paged cardiology on call with this information.

## 2021-07-13 NOTE — Progress Notes (Signed)
Mobility Specialist: Progress Note   07/13/21 1538  Mobility  Activity Ambulated in hall  Level of Assistance Minimal assist, patient does 75% or more  Assistive Device [facility] Ambulated (ft) 150 ft  Mobility Ambulated with assistance in hallway  Mobility Response Tolerated well  Mobility performed by Mobility specialist  Bed Position Chair  $Mobility charge 1 Mobility   Pre-Mobility: 71 HR, 94% SpO2 Post-Mobility: 94 HR, 98% SpO2  Pt required minA to sit EOB from supine as well as to stand. Pt c/o tightness/pain in her lower back during ambulation, no rating given. Pt to recliner after ambulation with NT present in the room.   [facility] Beau Vanduzer Mobility Specialist Mobility Specialist Phone: [phone]

## 2021-07-14 ENCOUNTER — Encounter (HOSPITAL_COMMUNITY): Payer: Self-pay | Admitting: Cardiology

## 2021-07-14 ENCOUNTER — Encounter (HOSPITAL_COMMUNITY)
Admission: EM | Disposition: A | Discharge status: Home / Self Care | Payer: Self-pay | Source: Home / Self Care | Attending: Cardiovascular Disease

## 2021-07-14 DIAGNOSIS — I10 Essential (primary) hypertension: Secondary | ICD-10-CM

## 2021-07-14 HISTORY — PX: RIGHT HEART CATH: CATH118263

## 2021-07-14 LAB — CBC
HCT: 29.2 % — ABNORMAL LOW (ref 36.0–46.0)
Hemoglobin: 8.8 g/dL — ABNORMAL LOW (ref 12.0–15.0)
MCH: 27.7 pg (ref 26.0–34.0)
MCHC: 30.1 g/dL (ref 30.0–36.0)
MCV: 91.8 fL (ref 80.0–100.0)
Platelets: 240 10*3/uL (ref 150–400)
RBC: 3.18 MIL/uL — ABNORMAL LOW (ref 3.87–5.11)
RDW: 14.8 % (ref 11.5–15.5)
WBC: 8.3 10*3/uL (ref 4.0–10.5)
nRBC: 0 % (ref 0.0–0.2)

## 2021-07-14 LAB — POCT I-STAT EG7
Acid-Base Excess: 8 mmol/L — ABNORMAL HIGH (ref 0.0–2.0)
Acid-Base Excess: 8 mmol/L — ABNORMAL HIGH (ref 0.0–2.0)
Bicarbonate: 34.4 mmol/L — ABNORMAL HIGH (ref 20.0–28.0)
Bicarbonate: 34.6 mmol/L — ABNORMAL HIGH (ref 20.0–28.0)
Calcium, Ion: 1.21 mmol/L (ref 1.15–1.40)
Calcium, Ion: 1.23 mmol/L (ref 1.15–1.40)
HCT: 27 % — ABNORMAL LOW (ref 36.0–46.0)
HCT: 28 % — ABNORMAL LOW (ref 36.0–46.0)
Hemoglobin: 9.2 g/dL — ABNORMAL LOW (ref 12.0–15.0)
Hemoglobin: 9.5 g/dL — ABNORMAL LOW (ref 12.0–15.0)
O2 Saturation: 55 %
O2 Saturation: 57 %
Potassium: 4 mmol/L (ref 3.5–5.1)
Potassium: 4.1 mmol/L (ref 3.5–5.1)
Sodium: 140 mmol/L (ref 135–145)
Sodium: 140 mmol/L (ref 135–145)
TCO2: 36 mmol/L — ABNORMAL HIGH (ref 22–32)
TCO2: 36 mmol/L — ABNORMAL HIGH (ref 22–32)
pCO2, Ven: 57.4 mmHg (ref 44.0–60.0)
pCO2, Ven: 57.5 mmHg (ref 44.0–60.0)
pH, Ven: 7.385 (ref 7.250–7.430)
pH, Ven: 7.388 (ref 7.250–7.430)
pO2, Ven: 30 mmHg — CL (ref 32.0–45.0)
pO2, Ven: 31 mmHg — CL (ref 32.0–45.0)

## 2021-07-14 LAB — GLUCOSE, CAPILLARY
Glucose-Capillary: 95 mg/dL (ref 70–99)
Glucose-Capillary: 115 mg/dL — ABNORMAL HIGH (ref 70–99)
Glucose-Capillary: 167 mg/dL — ABNORMAL HIGH (ref 70–99)
Glucose-Capillary: 180 mg/dL — ABNORMAL HIGH (ref 70–99)

## 2021-07-14 LAB — BASIC METABOLIC PANEL
Anion gap: 11 (ref 5–15)
BUN: 47 mg/dL — ABNORMAL HIGH (ref 8–23)
CO2: 32 mmol/L (ref 22–32)
Calcium: 9.3 mg/dL (ref 8.9–10.3)
Chloride: 94 mmol/L — ABNORMAL LOW (ref 98–111)
Creatinine, Ser: 3.08 mg/dL — ABNORMAL HIGH (ref 0.44–1.00)
GFR, Estimated: 17 mL/min — ABNORMAL LOW (ref >60)
Glucose, Bld: 137 mg/dL — ABNORMAL HIGH (ref 70–99)
Potassium: 4.3 mmol/L (ref 3.5–5.1)
Sodium: 137 mmol/L (ref 135–145)

## 2021-07-14 SURGERY — RIGHT HEART CATH
Anesthesia: LOCAL

## 2021-07-14 MED ORDER — HEPARIN (PORCINE) IN NACL 1000-0.9 UT/500ML-% IV SOLN
INTRAVENOUS | Status: AC
  Filled 2021-07-14: qty 500

## 2021-07-14 MED ORDER — HEPARIN (PORCINE) IN NACL 1000-0.9 UT/500ML-% IV SOLN
INTRAVENOUS | Status: DC | PRN
  Administered 2021-07-14: 500 mL

## 2021-07-14 MED ORDER — FUROSEMIDE 10 MG/ML IJ SOLN
12.0000 mg/h | INTRAVENOUS | Status: DC
  Administered 2021-07-14 – 2021-07-15 (×2): 12 mg/h via INTRAVENOUS
  Filled 2021-07-14 (×3): qty 20

## 2021-07-14 MED ORDER — LIDOCAINE HCL (PF) 1 % IJ SOLN
INTRAMUSCULAR | Status: DC | PRN
  Administered 2021-07-14: 2 mL

## 2021-07-14 MED ORDER — FUROSEMIDE 10 MG/ML IJ SOLN
80.0000 mg | Freq: Once | INTRAMUSCULAR | Status: AC
  Administered 2021-07-14: 80 mg via INTRAVENOUS
  Filled 2021-07-14: qty 8

## 2021-07-14 MED ORDER — LIDOCAINE HCL (PF) 1 % IJ SOLN
INTRAMUSCULAR | Status: AC
  Filled 2021-07-14: qty 30

## 2021-07-14 SURGICAL SUPPLY — 9 items
CATH BALLN WEDGE 5F 110CM (CATHETERS) ×2 IMPLANT
KIT HEART LEFT (KITS) ×2 IMPLANT
PACK CARDIAC CATHETERIZATION (CUSTOM PROCEDURE TRAY) ×2 IMPLANT
PROTECTION STATION PRESSURIZED (MISCELLANEOUS) ×2
SHEATH GLIDE SLENDER 4/5FR (SHEATH) ×2 IMPLANT
STATION PROTECTION PRESSURIZED (MISCELLANEOUS) ×1 IMPLANT
TRANSDUCER W/STOPCOCK (MISCELLANEOUS) ×2 IMPLANT
TUBING ART PRESS 72  MALE/FEM (TUBING) ×2
TUBING ART PRESS 72 MALE/FEM (TUBING) ×1 IMPLANT

## 2021-07-14 NOTE — Progress Notes (Addendum)
Patient ID: Nancy [name], female   DOB: 05-12-1961, 60 y.o.   MRN: [id number]     Advanced Heart Failure Rounding Note  PCP-Cardiologist: Quay Burow, MD   Subjective:    Yesterday coreg increased 18.75 mg twice a day and diuretics held due to elevated creatinine.   No complaints.   Objective:   Weight Range: 102 kg Body mass index is 38.6 kg/m.   Vital Signs:   Temp:  [97.8 F (36.6 C)-98.7 F (37.1 C)] 97.8 F (36.6 C) (07/29 0717) Pulse Rate:  [61-75] 70 (07/29 0831) Resp:  [12-23] 15 (07/29 0717) BP: (114-179)/(57-93) 114/93 (07/29 0717) SpO2:  [91 %-100 %] 97 % (07/29 0717) Weight:  [102 kg] 102 kg (07/29 0622) Last BM Date: 07/09/21  Weight change: Filed Weights   07/12/21 0600 07/13/21 0355 07/14/21 0622  Weight: 105.4 kg 101.8 kg 102 kg    Intake/Output:   Intake/Output Summary (Last 24 hours) at 07/14/2021 0903 Last data filed at 07/14/2021 0620 Gross per 24 hour  Intake 3 ml  Output 1550 ml  Net -1547 ml      Physical Exam    General:  No resp difficulty HEENT: normal Neck: supple. no JVD. Carotids 2+ bilat; no bruits. No lymphadenopathy or thryomegaly appreciated. Cor: PMI nondisplaced. Regular rate & rhythm. No rubs, gallops or murmurs. Lungs: clear Abdomen: soft, nontender, nondistended. No hepatosplenomegaly. No bruits or masses. Good bowel sounds. Extremities: no cyanosis, clubbing, rash, R and LLE 1+ edema Neuro: alert & orientedx3, cranial nerves grossly intact. moves all 4 extremities w/o difficulty. Affect pleasant   Telemetry   NSr 60-80s   Labs    CBC Recent Labs    07/14/21 0012  WBC 8.3  HGB 8.8*  HCT 29.2*  MCV 91.8  PLT 932   Basic Metabolic Panel Recent Labs    07/13/21 0020 07/14/21 0012  NA 137 137  K 4.3 4.3  CL 100 94*  CO2 30 32  GLUCOSE 121* 137*  BUN 47* 47*  CREATININE 3.07* 3.08*  CALCIUM 8.9 9.3   Liver Function Tests No results for input(s): AST, ALT, ALKPHOS, BILITOT, PROT, ALBUMIN  in the last 72 hours. No results for input(s): LIPASE, AMYLASE in the last 72 hours. Cardiac Enzymes No results for input(s): CKTOTAL, CKMB, CKMBINDEX, TROPONINI in the last 72 hours.  BNP: BNP (last 3 results) Recent Labs    05/28/21 0105 06/10/21 1210 07/07/21 1530  BNP 785.3* 511.7* 633.1*    ProBNP (last 3 results) No results for input(s): PROBNP in the last 8760 hours.   D-Dimer No results for input(s): DDIMER in the last 72 hours. Hemoglobin A1C No results for input(s): HGBA1C in the last 72 hours. Fasting Lipid Panel No results for input(s): CHOL, HDL, LDLCALC, TRIG, CHOLHDL, LDLDIRECT in the last 72 hours.  Thyroid Function Tests No results for input(s): TSH, T4TOTAL, T3FREE, THYROIDAB in the last 72 hours.  Invalid input(s): FREET3  Other results:   Imaging    No results found.   Medications:     Scheduled Medications:  amLODipine  5 mg Oral Daily   aspirin EC  81 mg Oral Daily   atorvastatin  40 mg Oral Daily   carvedilol  18.75 mg Oral BID WC   DULoxetine  20 mg Oral Daily   heparin  5,000 Units Subcutaneous Q8H   insulin aspart  0-20 Units Subcutaneous TID WC   mirtazapine  15 mg Oral QHS   risperiDONE  3 mg Oral QHS  sodium chloride flush  3 mL Intravenous Q12H   sodium chloride flush  3 mL Intravenous Q12H    Infusions:  sodium chloride Stopped (07/08/21 2102)   sodium chloride     sodium chloride 10 mL/hr at 07/14/21 0620    PRN Medications: sodium chloride, sodium chloride, acetaminophen, ondansetron (ZOFRAN) IV, sodium chloride flush, sodium chloride flush   Assessment/Plan   1. Acute on chronic diastolic CHF: Last echo in 6/22 showed EF 55-60%, moderate LVH, normal RV, dilated IVC.  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  Multiple admissions recently with CHF, complicated by CKD stage IV.   Volume status much improved. Will stop lasix drip. Creatinine mildly higher 2.86 => 2.93=>3.07=>3.08     -Concerned we will be limited by  renal function.  - Adjust diuretics after cath.  - PYP scan not suggestive amyloid.  myeloma panel (pending), urine immunofixation (negative) to rule out cardiac amyloidosis => PYP scan not suggestive of TTR cardiac amyloidosis. - RHC today.   - Would likely benefit from [place]. 2. HTN: BP still elevated. - Continue amlodipine. - 3. CKD stage IV: Creatinine plateau at 3.08.  4. Carotid disease: Occluded RICA. - Continue ASA, statin.   RHC today .  Length of Stay: [place], NP  07/14/2021, 9:03 AM  Advanced Heart Failure Team Pager [phone] (M-F; 7a - 5p)  Please contact [facility] for night-coverage after hours (5p -7a ) and weekends on [email or web address]  Patient seen with NP, agree with the above note.   Creatinine stable at 3.07 today, she did not get any diuretic yesterday.    RHC Procedural Findings: Hemodynamics (mmHg) RA mean 13 RV 68/19 PA 68/22, mean 42 PCWP mean 25 with v-waves to 46 Oxygen saturations: PA 56% AO 98% Cardiac Output (Fick) 5.44  Cardiac Index (Fick) 2.65 PVR 3.1 WU  General: NAD Neck: JVP 12+, no thyromegaly or thyroid nodule.  Lungs: Clear to auscultation bilaterally with normal respiratory effort. CV: Nondisplaced PMI.  Heart regular S1/S2, no S3/S4, no murmur.  2+ edema to knees. Abdomen: Soft, nontender, no hepatosplenomegaly, no distention.  Skin: Intact without lesions or rashes.  Neurologic: Alert and oriented x 3.  Psych: Normal affect. Extremities: No clubbing or cyanosis.  HEENT: Normal.   RHC suggests ongoing volume overload with elevated PCWP and RA.  Prominent v-waves in PCWP but think due to severe diastolic dysfunction as there is no significant MR on review of recent echo.  PYP study was not suggestive of TTR cardiac amyloidosis.  Suspect severe diastolic dysfunction due to long-standing and poorly controlled HTN.  - I will restart Lasix today, 80 mg IV bolus with 12 mg/hr gtt.  - Follow renal function closely, this will  be a limiting factor.  May have to involve nephrology.   Loralie Champagne 07/14/2021 9:56 AM

## 2021-07-14 NOTE — Progress Notes (Signed)
Pt to cath lab.

## 2021-07-14 NOTE — Interval H&P Note (Signed)
History and Physical Interval Note:  07/14/2021 9:19 AM  Nancy [name]  has presented today for surgery, with the diagnosis of heart failure.  The various methods of treatment have been discussed with the patient and family. After consideration of risks, benefits and other options for treatment, the patient has consented to  Procedure(s): RIGHT HEART CATH (N/A) as a surgical intervention.  The patient's history has been reviewed, patient examined, no change in status, stable for surgery.  I have reviewed the patient's chart and labs.  Questions were answered to the patient's satisfaction.     Nancy [name] [employer]

## 2021-07-15 LAB — GLUCOSE, CAPILLARY
Glucose-Capillary: 117 mg/dL — ABNORMAL HIGH (ref 70–99)
Glucose-Capillary: 153 mg/dL — ABNORMAL HIGH (ref 70–99)
Glucose-Capillary: 189 mg/dL — ABNORMAL HIGH (ref 70–99)
Glucose-Capillary: 224 mg/dL — ABNORMAL HIGH (ref 70–99)

## 2021-07-15 LAB — MULTIPLE MYELOMA PANEL, SERUM
Albumin SerPl Elph-Mcnc: 3.1 g/dL (ref 2.9–4.4)
Albumin/Glob SerPl: 1.1 (ref 0.7–1.7)
Alpha 1: 0.2 g/dL (ref 0.0–0.4)
Alpha2 Glob SerPl Elph-Mcnc: 0.9 g/dL (ref 0.4–1.0)
B-Globulin SerPl Elph-Mcnc: 0.9 g/dL (ref 0.7–1.3)
Gamma Glob SerPl Elph-Mcnc: 1 g/dL (ref 0.4–1.8)
Globulin, Total: 3.1 g/dL (ref 2.2–3.9)
IgA: 469 mg/dL — ABNORMAL HIGH (ref 87–352)
IgG (Immunoglobin G), Serum: 1250 mg/dL (ref 586–1602)
IgM (Immunoglobulin M), Srm: 99 mg/dL (ref 26–217)
Total Protein ELP: 6.2 g/dL (ref 6.0–8.5)

## 2021-07-15 LAB — BASIC METABOLIC PANEL
Anion gap: 7 (ref 5–15)
BUN: 51 mg/dL — ABNORMAL HIGH (ref 8–23)
CO2: 34 mmol/L — ABNORMAL HIGH (ref 22–32)
Calcium: 9.1 mg/dL (ref 8.9–10.3)
Chloride: 96 mmol/L — ABNORMAL LOW (ref 98–111)
Creatinine, Ser: 3.13 mg/dL — ABNORMAL HIGH (ref 0.44–1.00)
GFR, Estimated: 16 mL/min — ABNORMAL LOW (ref >60)
Glucose, Bld: 127 mg/dL — ABNORMAL HIGH (ref 70–99)
Potassium: 4.5 mmol/L (ref 3.5–5.1)
Sodium: 137 mmol/L (ref 135–145)

## 2021-07-15 NOTE — Progress Notes (Signed)
Patient ID: Nancy [name], female   DOB: Aug 22, 1961, 60 y.o.   MRN: [id number]     Advanced Heart Failure Rounding Note  PCP-Cardiologist: Quay Burow, MD   Subjective:    SBP 130s today.  Good diuresis, weight down 4 more lbs.  Creatinine 3.08 => 3.13. Remains on Lasix gtt 12 mg/hr.   RHC Procedural Findings (7/29): Hemodynamics (mmHg) RA mean 13 RV 68/19 PA 68/22, mean 42 PCWP mean 25 with v-waves to 46 Oxygen saturations: PA 56% AO 98% Cardiac Output (Fick) 5.44  Cardiac Index (Fick) 2.65 PVR 3.1 WU  Objective:   Weight Range: 99.8 kg Body mass index is 37.77 kg/m.   Vital Signs:   Temp:  [97.8 F (36.6 C)-98.5 F (36.9 C)] 98.5 F (36.9 C) (07/30 0745) Pulse Rate:  [66-73] 73 (07/30 0745) Resp:  [11-21] 19 (07/30 0745) BP: (130-158)/(61-93) 130/61 (07/30 0745) SpO2:  [95 %-100 %] 97 % (07/30 0745) Weight:  [99.8 kg] 99.8 kg (07/30 0500) Last BM Date: 07/09/21  Weight change: Filed Weights   07/13/21 0355 07/14/21 0622 07/15/21 0500  Weight: 101.8 kg 102 kg 99.8 kg    Intake/Output:   Intake/Output Summary (Last 24 hours) at 07/15/2021 1009 Last data filed at 07/15/2021 0816 Gross per 24 hour  Intake 860.14 ml  Output 3450 ml  Net -2589.86 ml      Physical Exam    General: NAD Neck: JVP 8 cm, no thyromegaly or thyroid nodule.  Lungs: Clear to auscultation bilaterally with normal respiratory effort. CV: Nondisplaced PMI.  Heart regular S1/S2, no S3/S4, no murmur.  1+ ankle edema (improved) Abdomen: Soft, nontender, no hepatosplenomegaly, no distention.  Skin: Intact without lesions or rashes.  Neurologic: Alert and oriented x 3.  Psych: Normal affect. Extremities: No clubbing or cyanosis.  HEENT: Normal.    Telemetry   NSR 70s (personally reviewed)  Labs    CBC Recent Labs    07/14/21 0012 07/14/21 0939  WBC 8.3  --   HGB 8.8* 9.5*  9.2*  HCT 29.2* 28.0*  27.0*  MCV 91.8  --   PLT 240  --    Basic Metabolic  Panel Recent Labs    07/14/21 0012 07/14/21 0939 07/15/21 0011  NA 137 140  140 137  K 4.3 4.0  4.1 4.5  CL 94*  --  96*  CO2 32  --  34*  GLUCOSE 137*  --  127*  BUN 47*  --  51*  CREATININE 3.08*  --  3.13*  CALCIUM 9.3  --  9.1   Liver Function Tests No results for input(s): AST, ALT, ALKPHOS, BILITOT, PROT, ALBUMIN in the last 72 hours. No results for input(s): LIPASE, AMYLASE in the last 72 hours. Cardiac Enzymes No results for input(s): CKTOTAL, CKMB, CKMBINDEX, TROPONINI in the last 72 hours.  BNP: BNP (last 3 results) Recent Labs    05/28/21 0105 06/10/21 1210 07/07/21 1530  BNP 785.3* 511.7* 633.1*    ProBNP (last 3 results) No results for input(s): PROBNP in the last 8760 hours.   D-Dimer No results for input(s): DDIMER in the last 72 hours. Hemoglobin A1C No results for input(s): HGBA1C in the last 72 hours. Fasting Lipid Panel No results for input(s): CHOL, HDL, LDLCALC, TRIG, CHOLHDL, LDLDIRECT in the last 72 hours.  Thyroid Function Tests No results for input(s): TSH, T4TOTAL, T3FREE, THYROIDAB in the last 72 hours.  Invalid input(s): FREET3  Other results:   Imaging    No results found.  Medications:     Scheduled Medications:  amLODipine  5 mg Oral Daily   aspirin EC  81 mg Oral Daily   atorvastatin  40 mg Oral Daily   carvedilol  18.75 mg Oral BID WC   DULoxetine  20 mg Oral Daily   heparin  5,000 Units Subcutaneous Q8H   insulin aspart  0-20 Units Subcutaneous TID WC   mirtazapine  15 mg Oral QHS   risperiDONE  3 mg Oral QHS   sodium chloride flush  3 mL Intravenous Q12H   sodium chloride flush  3 mL Intravenous Q12H    Infusions:  sodium chloride Stopped (07/08/21 2102)    PRN Medications: sodium chloride, acetaminophen, ondansetron (ZOFRAN) IV, sodium chloride flush   Assessment/Plan   1. Acute on chronic diastolic CHF: Last echo in 6/22 showed EF 55-60%, moderate LVH, normal RV, dilated IVC.  LHC in 5/22 showed  nonobstructive CAD.  Multiple admissions recently with CHF, complicated by CKD stage IV.  RHC on 7/29 suggested ongoing volume overload with elevated PCWP and RA.  Prominent v-waves in PCWP but think due to severe diastolic dysfunction as there is no significant MR on review of recent echo.  PYP study was not suggestive of TTR cardiac amyloidosis.  Suspect severe diastolic dysfunction due to long-standing and poorly controlled HTN.  Good diuresis yesterday again on Lasix 12 mg/hr, weight down another 4 lbs (26 total). Creatinine mildly higher 3.08 => 3.13.  - I think we've diuresed her as much as we will be able to acutely given renal dysfunction => stop Lasix today.  - Start torsemide tomorrow if renal function stable.    - Would likely benefit from [facility], will aim to place as outpatient.  2. HTN: Improved BP.  3. AKI on CKD stage IV: Creatinine 3.13 today, mildly higher.   - Stopping IV Lasix.   4. Carotid disease: Occluded RICA. - Continue ASA, statin.   Ambulate in hall  Length of Stay: [place], MD  07/15/2021, 10:09 AM  Advanced Heart Failure Team Pager [phone] (M-F; 7a - 5p)  Please contact [facility] for night-coverage after hours (5p -7a ) and weekends on [email or web address]

## 2021-07-15 NOTE — Progress Notes (Signed)
CARDIAC REHAB PHASE I   PRE:  Rate/Rhythm: 77 SR    BP: lying 150/81    SaO2: 97 RA  MODE:  Ambulation: 90 ft   POST:  Rate/Rhythm: 82 SR    BP: sitting 111/64     SaO2: 100 RA  Upon standing pt unsteady on feet with her cane. Used RW with improved balance, gait belt assist. At 45 ft pt suddenly c/o fatigue. Rested and able to walk back to room. However BP significantly lower in recliner after walk. Pt then admits to dizziness walking which she stated was fatigue in hall. Notified RN, pt stable in recliner.  [place], ACSM 07/15/2021 2:32 PM

## 2021-07-16 LAB — BASIC METABOLIC PANEL
Anion gap: 8 (ref 5–15)
BUN: 57 mg/dL — ABNORMAL HIGH (ref 8–23)
CO2: 34 mmol/L — ABNORMAL HIGH (ref 22–32)
Calcium: 9 mg/dL (ref 8.9–10.3)
Chloride: 94 mmol/L — ABNORMAL LOW (ref 98–111)
Creatinine, Ser: 3.34 mg/dL — ABNORMAL HIGH (ref 0.44–1.00)
GFR, Estimated: 15 mL/min — ABNORMAL LOW (ref >60)
Glucose, Bld: 141 mg/dL — ABNORMAL HIGH (ref 70–99)
Potassium: 4.4 mmol/L (ref 3.5–5.1)
Sodium: 136 mmol/L (ref 135–145)

## 2021-07-16 LAB — GLUCOSE, CAPILLARY
Glucose-Capillary: 132 mg/dL — ABNORMAL HIGH (ref 70–99)
Glucose-Capillary: 179 mg/dL — ABNORMAL HIGH (ref 70–99)
Glucose-Capillary: 146 mg/dL — ABNORMAL HIGH (ref 70–99)
Glucose-Capillary: 205 mg/dL — ABNORMAL HIGH (ref 70–99)

## 2021-07-16 MED ORDER — CARVEDILOL 12.5 MG PO TABS
12.5000 mg | ORAL_TABLET | Freq: Two times a day (BID) | ORAL | Status: DC
  Administered 2021-07-16 – 2021-07-23 (×14): 12.5 mg via ORAL
  Filled 2021-07-16 (×14): qty 1

## 2021-07-16 NOTE — Progress Notes (Signed)
Patient ID: Nancy [name], female   DOB: 10/28/1961, 60 y.o.   MRN: [id number]     Advanced Heart Failure Rounding Note  PCP-Cardiologist: Quay Burow, MD   Subjective:    BP stable.  Weight down 2 lbs.  Creatinine 3.08 => 3.13 => 3.34.  Off diuretics.   RHC Procedural Findings (7/29): Hemodynamics (mmHg) RA mean 13 RV 68/19 PA 68/22, mean 42 PCWP mean 25 with v-waves to 46 Oxygen saturations: PA 56% AO 98% Cardiac Output (Fick) 5.44  Cardiac Index (Fick) 2.65 PVR 3.1 WU  Objective:   Weight Range: 99.2 kg Body mass index is 37.54 kg/m.   Vital Signs:   Temp:  [97.7 F (36.5 C)-98.8 F (37.1 C)] 98 F (36.7 C) (07/31 0737) Pulse Rate:  [61-74] 70 (07/31 0737) Resp:  [11-19] 16 (07/31 0737) BP: (111-147)/(48-75) 143/71 (07/31 0737) SpO2:  [94 %-100 %] 96 % (07/31 0737) Weight:  [99.2 kg] 99.2 kg (07/31 0400) Last BM Date: 07/14/21  Weight change: Filed Weights   07/14/21 0622 07/15/21 0500 07/16/21 0400  Weight: 102 kg 99.8 kg 99.2 kg    Intake/Output:   Intake/Output Summary (Last 24 hours) at 07/16/2021 0929 Last data filed at 07/16/2021 0700 Gross per 24 hour  Intake 600 ml  Output 1200 ml  Net -600 ml      Physical Exam    General: NAD Neck: No JVD, no thyromegaly or thyroid nodule.  Lungs: Clear to auscultation bilaterally with normal respiratory effort. CV: Nondisplaced PMI.  Heart regular S1/S2, no S3/S4, no murmur.  1+ edema to knees.  Abdomen: Soft, nontender, no hepatosplenomegaly, no distention.  Skin: Intact without lesions or rashes.  Neurologic: Alert and oriented x 3.  Psych: Normal affect. Extremities: No clubbing or cyanosis.  HEENT: Normal.    Telemetry   NSR 70s (personally reviewed)  Labs    CBC Recent Labs    07/14/21 0012 07/14/21 0939  WBC 8.3  --   HGB 8.8* 9.5*  9.2*  HCT 29.2* 28.0*  27.0*  MCV 91.8  --   PLT 240  --    Basic Metabolic Panel Recent Labs    07/15/21 0011 07/16/21 0021  NA 137  136  K 4.5 4.4  CL 96* 94*  CO2 34* 34*  GLUCOSE 127* 141*  BUN 51* 57*  CREATININE 3.13* 3.34*  CALCIUM 9.1 9.0   Liver Function Tests No results for input(s): AST, ALT, ALKPHOS, BILITOT, PROT, ALBUMIN in the last 72 hours. No results for input(s): LIPASE, AMYLASE in the last 72 hours. Cardiac Enzymes No results for input(s): CKTOTAL, CKMB, CKMBINDEX, TROPONINI in the last 72 hours.  BNP: BNP (last 3 results) Recent Labs    05/28/21 0105 06/10/21 1210 07/07/21 1530  BNP 785.3* 511.7* 633.1*    ProBNP (last 3 results) No results for input(s): PROBNP in the last 8760 hours.   D-Dimer No results for input(s): DDIMER in the last 72 hours. Hemoglobin A1C No results for input(s): HGBA1C in the last 72 hours. Fasting Lipid Panel No results for input(s): CHOL, HDL, LDLCALC, TRIG, CHOLHDL, LDLDIRECT in the last 72 hours.  Thyroid Function Tests No results for input(s): TSH, T4TOTAL, T3FREE, THYROIDAB in the last 72 hours.  Invalid input(s): FREET3  Other results:   Imaging    No results found.   Medications:     Scheduled Medications:  amLODipine  5 mg Oral Daily   aspirin EC  81 mg Oral Daily   atorvastatin  40 mg  Oral Daily   carvedilol  12.5 mg Oral BID WC   DULoxetine  20 mg Oral Daily   heparin  5,000 Units Subcutaneous Q8H   insulin aspart  0-20 Units Subcutaneous TID WC   mirtazapine  15 mg Oral QHS   risperiDONE  3 mg Oral QHS   sodium chloride flush  3 mL Intravenous Q12H   sodium chloride flush  3 mL Intravenous Q12H    Infusions:  sodium chloride Stopped (07/08/21 2102)    PRN Medications: sodium chloride, acetaminophen, ondansetron (ZOFRAN) IV, sodium chloride flush   Assessment/Plan   1. Acute on chronic diastolic CHF: Last echo in 6/22 showed EF 55-60%, moderate LVH, normal RV, dilated IVC.  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  Multiple admissions recently with CHF, complicated by CKD stage IV.  RHC on 7/29 suggested ongoing volume  overload with elevated PCWP and RA.  Prominent v-waves in PCWP but think due to severe diastolic dysfunction as there is no significant MR on review of recent echo.  PYP study was not suggestive of TTR cardiac amyloidosis.  Suspect severe diastolic dysfunction due to long-standing and poorly controlled HTN.  She diuresed well on Lasix 12 mg/hr, now off with creatinine rise. Creatinine higher 3.08 => 3.13 => 3.34.  - Off diuretics, will not start po yet with rise in creatinine today.  Start torsemide tomorrow if creatinine stabilizes.   - Would likely benefit from Cardiomems, will aim to place as outpatient.  2. HTN: Improved BP. Her BP was noted to drop with standing and she had lightheadedness while walking.  - Decrease Coreg to 12.5 mg bid.  3. AKI on CKD stage IV: Creatinine 3.34 today, higher.   - No diuretic.  4. Carotid disease: Occluded RICA. - Continue ASA, statin.   Keep today and follow creatinine, if stable tomorrow may be able to go home.   Length of Stay: 9  Loralie Champagne, MD  07/16/2021, 9:29 AM  Advanced Heart Failure Team Pager [phone] (M-F; 7a - 5p)  Please contact [facility] for night-coverage after hours (5p -7a ) and weekends on [email or web address]

## 2021-07-16 NOTE — Evaluation (Signed)
Physical Therapy Evaluation Patient Details Name: Nancy [name] MRN: [id number] DOB: [birth year] Today's Date: 07/16/2021   History of Present Illness  60 y.o. female presents to [facility] ED from PCP due to DOE and orthopnea. Pt admitted for management of CHF. Pt underwent R heart cath on 07/14/2021. PMH includes anemia, depression, DM, peripheral neuropathy, HLD, HTN, CVAm CHF.  Clinical Impression  Pt presents to PT with deficits in activity tolerance, gait, balance, power. Pt is largely limited by low back pain at this time, but is likely not far from baseline. Pt is able to ambulate household distances without physical assistance. Pt does require BUE support of walker due to balance deficits, but has access to a walker at home. Pt will benefit from continued acute PT services to improve balance and aide in a return to her prior level of function.    Follow Up Recommendations No PT follow up    Equipment Recommendations  None recommended by PT    Recommendations for Other Services       Precautions / Restrictions Precautions Precautions: Fall Restrictions Weight Bearing Restrictions: No      Mobility  Bed Mobility               General bed mobility comments: pt received and left in recliner    Transfers Overall transfer level: Needs assistance Equipment used: Rolling walker (2 wheeled);Straight cane Transfers: Sit to/from Stand Sit to Stand: Supervision         General transfer comment: high guard once standing with cane  Ambulation/Gait Ambulation/Gait assistance: Supervision Gait Distance (Feet): 100 Feet Assistive device: Rolling walker (2 wheeled) Gait Pattern/deviations: Step-through pattern Gait velocity: reduced Gait velocity interpretation: 1.31 - 2.62 ft/sec, indicative of limited community ambulator General Gait Details: pt with slowed step-through gait. Pt attempts to initiate gait with cane however unsteady and requiring BUE support  Stairs             Wheelchair Mobility    Modified Rankin (Stroke Patients Only)       Balance Overall balance assessment: Needs assistance Sitting-balance support: No upper extremity supported;Feet supported Sitting balance-Leahy Scale: Good     Standing balance support: Single extremity supported;Bilateral upper extremity supported Standing balance-Leahy Scale: Poor Standing balance comment: reliant on UE support                             Pertinent Vitals/Pain Pain Assessment: No/denies pain    Home Living Family/patient expects to be discharged to:: Private residence Living Arrangements: Spouse/significant other Available Help at Discharge: Available PRN/intermittently Type of Home: Apartment Home Access: Stairs to enter Entrance Stairs-Rails: None Entrance Stairs-Number of Steps: 1 Home Layout: One level Home Equipment: Walker - 2 wheels;Cane - single point;Bedside commode;Shower seat      Prior Function Level of Independence: Needs assistance   Gait / Transfers Assistance Needed: household ambulator with cane mostly  ADL's / Homemaking Assistance Needed: spouse cooks, cleans, has assist for ADL's        Hand Dominance   Dominant Hand: Right    Extremity/Trunk Assessment   Upper Extremity Assessment Upper Extremity Assessment: Overall WFL for tasks assessed    Lower Extremity Assessment Lower Extremity Assessment: Generalized weakness    Cervical / Trunk Assessment Cervical / Trunk Assessment: Normal  Communication   Communication: No difficulties  Cognition Arousal/Alertness: Awake/alert Behavior During Therapy: WFL for tasks assessed/performed Overall Cognitive Status: No family/caregiver present to determine baseline cognitive  functioning                                 General Comments: likely baseline, follows commands well, participates appropriately      General Comments General comments (skin integrity, edema,  etc.): VSS on RA    Exercises     Assessment/Plan    PT Assessment Patient needs continued PT services  PT Problem List Decreased strength;Decreased activity tolerance;Decreased balance;Decreased mobility;Pain       PT Treatment Interventions DME instruction;Gait training;Stair training;Functional mobility training;Therapeutic activities;Therapeutic exercise;Balance training;Patient/family education    PT Goals (Current goals can be found in the Care Plan section)  Acute Rehab PT Goals Patient Stated Goal: to go home PT Goal Formulation: With patient Time For Goal Achievement: 07/30/21 Potential to Achieve Goals: Good    Frequency Min 3X/week   Barriers to discharge        Co-evaluation               AM-PAC PT "6 Clicks" Mobility  Outcome Measure Help needed turning from your back to your side while in a flat bed without using bedrails?: A Little Help needed moving from lying on your back to sitting on the side of a flat bed without using bedrails?: A Little Help needed moving to and from a bed to a chair (including a wheelchair)?: A Little Help needed standing up from a chair using your arms (e.g., wheelchair or bedside chair)?: A Little Help needed to walk in hospital room?: A Little Help needed climbing 3-5 steps with a railing? : A Lot 6 Click Score: 17    End of Session   Activity Tolerance: Patient tolerated treatment well Patient left: with call bell/phone within reach;in bed;with bed alarm set Nurse Communication: Mobility status PT Visit Diagnosis: Other abnormalities of gait and mobility (R26.89);Muscle weakness (generalized) (M62.81);Pain Pain - part of body:  (back)    Time: 8675-4492 PT Time Calculation (min) (ACUTE ONLY): 24 min   Charges:   PT Evaluation $PT Eval Low Complexity: 1 Low          Zenaida Niece, PT, DPT Acute Rehabilitation Pager: [phone]   Zenaida Niece 07/16/2021, 1:10 PM

## 2021-07-17 LAB — BASIC METABOLIC PANEL WITH GFR
Anion gap: 9 (ref 5–15)
BUN: 67 mg/dL — ABNORMAL HIGH (ref 8–23)
CO2: 32 mmol/L (ref 22–32)
Calcium: 8.8 mg/dL — ABNORMAL LOW (ref 8.9–10.3)
Chloride: 94 mmol/L — ABNORMAL LOW (ref 98–111)
Creatinine, Ser: 3.57 mg/dL — ABNORMAL HIGH (ref 0.44–1.00)
GFR, Estimated: 14 mL/min — ABNORMAL LOW
Glucose, Bld: 168 mg/dL — ABNORMAL HIGH (ref 70–99)
Potassium: 4.6 mmol/L (ref 3.5–5.1)
Sodium: 135 mmol/L (ref 135–145)

## 2021-07-17 LAB — GLUCOSE, CAPILLARY
Glucose-Capillary: 119 mg/dL — ABNORMAL HIGH (ref 70–99)
Glucose-Capillary: 201 mg/dL — ABNORMAL HIGH (ref 70–99)
Glucose-Capillary: 185 mg/dL — ABNORMAL HIGH (ref 70–99)
Glucose-Capillary: 189 mg/dL — ABNORMAL HIGH (ref 70–99)

## 2021-07-17 NOTE — Hospital Course (Addendum)
  60 y/o AAF w/ chronic diastolic heart failure w/ multiple readmissions for a/c CHF in the last 2 months, Stage IV CKD (baseline SCr ~2.5-3 recently), HTN, T2DM, HLD, CVA, carotid artery stenosis ([place] 3/22: occluded Rt ICA, mod Lt ICA stenosis) and tobacco abuse.  This is her 3rd hospital admit for A/C diastolic heart failure.   Admitted with marked volume overload. Advanced Heart Failure Team consulted. Diuresed IV lasix and started on lasix drip.  Diuretics stopped 07/30. Once diuresed had RHC which showed elevated right and left heart filling pressures, prominent v-waving in PCWP tracing (likely d/t severe diastolic dysfunction), primarily pulmonary venous hypertension and preserved cardiac output. PYP scan not suggestive of TTR cardiac amyloid. Creatinine continued to rise after diuretic stopped and Nephrology consulted. AKI on CKD felt to be due to diuresis and temperamental kidneys in setting of proteinuria. Attempted to start Torsemide d/t mild volume overload on 08/04 but D/C'd the next day when creatinine crept up to 4.    RHC Procedural Findings (7/29): Hemodynamics (mmHg) RA mean 13 RV 68/19 PA 68/22, mean 42 PCWP mean 25 with v-waves to 46 Oxygen saturations: PA 56% AO 98% Cardiac Output (Fick) 5.44  Cardiac Index (Fick) 2.65 PVR 3.1 WU   1. Acute on chronic diastolic CHF: -Last echo in 6/22 showed EF 55-60%, moderate LVH, normal RV, dilated IVC.  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  Multiple admissions recently with CHF, complicated by CKD stage IV.  RHC on 7/29 suggested ongoing volume overload with elevated PCWP and RA.  Prominent v-waves in PCWP but think due to severe diastolic dysfunction as there is no significant MR on review of recent echo.  PYP study was not suggestive of TTR cardiac amyloidosis.  Suspect severe diastolic dysfunction due to long-standing and poorly controlled HTN.   -She diuresed well on Lasix 12 mg/hr, now off with creatinine rise. Volume up 08/04,  restarted torsemide at 40 mg daily. Good output. Down 2 lb. JVP ~ 8. Hold torsemide today with Scr continuing to trend up. Creatinine continues to trend up - 3.08 => 3.13 => 3.34 => 3.57=> 3.7 => 3.7=> 3.85=> 4.00 -Would likely benefit from Cardiomems, will aim to place as outpatient. Discussed with patient. -Close f/u after discharge. 2. HTN: -BP stable -Avoid hypotension with AKI on stage IV CKD. 3. AKI on CKD stage IV: -Creatinine 3.7>3.7>3.85>4.0 -Nephrology consulted, appreciate input. AKI felt to be due to hemodynamic shifts from diuresis, temperamental kidneys with significant proteinuria -Renal US with no evidence of obstruction -UA/urine sodium collected -Will watch in hospital until creatinine plateaus 4. Carotid disease: -Occluded RICA. -Continue ASA, statin. 5. Hyperkalemia  -K 5.2>4.7>5.4 --Received [place] today.   -Possible D/C over weekend. Sent meds [facility] f/u scheduled

## 2021-07-17 NOTE — TOC Transition Note (Addendum)
Transition of Care [facility]) - CM/SW Discharge Note   Patient Details  Name: Nancy [name] MRN: [id number] Date of Birth: 08-Oct-1961  Transition of Care [facility]) CM/SW Contact:  Erenest Rasher, RN Phone Number:  [phone] 07/17/2021, 2:12 PM   Clinical Narrative:     TOC CM reviewed chart for possible dc tomorrow. Pt has follow up appt at [facility] on 08/01/2021. Pt uses [facility] for her meds which she receives at a discounted rate. Pt may utilize the one time free fill if she has not used in a year. [facility] has wedge pillows at [facility] to be paid from HF fund, $25.   Final next level of care: Home/Self Care Barriers to Discharge: No Barriers Identified   Patient Goals and CMS Choice        Discharge Placement                       Discharge Plan and Services In-house Referral: Clinical Social Work Discharge Planning Services: CM Consult, Medication Assistance Post Acute Care Choice: Durable Medical Equipment          DME Arranged: Other see comment (wedge pillow) DME Agency: [facility] Date DME Agency Contacted: 07/17/21 Time DME Agency Contacted: [phone] Representative spoke with at DME Agency: Melina Modena            Social Determinants of Health ([place]) Interventions Food Insecurity Interventions: Assist with SNAP Application Financial Strain Interventions: Development worker, community (Referral to [employer]) Housing Interventions: Intervention Not Indicated Transportation Interventions: Intervention Not Indicated   Readmission Risk Interventions Readmission Risk Prevention Plan 03/07/2020  Transportation Screening Complete  PCP or Specialist Appt within 3-5 Days Complete  HRI or [place] (No Data)  Social Work Consult for [place] Planning/Counseling [place] Not Applicable  Medication Review Press photographer) Complete  Some recent data might be hidden

## 2021-07-17 NOTE — Progress Notes (Signed)
Patient ID: Nancy [name], female   DOB: 13-Jul-1961, 60 y.o.   MRN: [id number]     Advanced Heart Failure Rounding Note  PCP-Cardiologist: Quay Burow, MD   Subjective:    BP stable and weight stable.  Creatinine 3.08 => 3.13 => 3.34 => 3.57.  Off diuretics.   RHC Procedural Findings (7/29): Hemodynamics (mmHg) RA mean 13 RV 68/19 PA 68/22, mean 42 PCWP mean 25 with v-waves to 46 Oxygen saturations: PA 56% AO 98% Cardiac Output (Fick) 5.44  Cardiac Index (Fick) 2.65 PVR 3.1 WU  Objective:   Weight Range: 98.9 kg Body mass index is 37.43 kg/m.   Vital Signs:   Temp:  [97.5 F (36.4 C)-98.7 F (37.1 C)] 97.5 F (36.4 C) (08/01 0808) Pulse Rate:  [62-73] 69 (08/01 0808) Resp:  [11-18] 14 (08/01 0808) BP: (121-158)/(67-85) 121/78 (08/01 0808) SpO2:  [96 %-98 %] 96 % (08/01 0808) Weight:  [98.9 kg] 98.9 kg (08/01 0500) Last BM Date: 07/15/21  Weight change: Filed Weights   07/15/21 0500 07/16/21 0400 07/17/21 0500  Weight: 99.8 kg 99.2 kg 98.9 kg    Intake/Output:   Intake/Output Summary (Last 24 hours) at 07/17/2021 0820 Last data filed at 07/17/2021 0814 Gross per 24 hour  Intake 780 ml  Output 1450 ml  Net -670 ml      Physical Exam    General: NAD Neck: JVP 8 cm, no thyromegaly or thyroid nodule.  Lungs: Clear to auscultation bilaterally with normal respiratory effort. CV: Nondisplaced PMI.  Heart regular S1/S2, no S3/S4, no murmur.  1+ edema to knees.  No carotid bruit.  Normal pedal pulses.  Abdomen: Soft, nontender, no hepatosplenomegaly, no distention.  Skin: Intact without lesions or rashes.  Neurologic: Alert and oriented x 3.  Psych: Normal affect. Extremities: No clubbing or cyanosis.  HEENT: Normal.    Telemetry   NSR 70s (personally reviewed)  Labs    CBC Recent Labs    07/14/21 0939  HGB 9.5*  9.2*  HCT 28.0*  70.0*   Basic Metabolic Panel Recent Labs    07/16/21 0021 07/17/21 0004  NA 136 135  K 4.4 4.6  CL  94* 94*  CO2 34* 32  GLUCOSE 141* 168*  BUN 57* 67*  CREATININE 3.34* 3.57*  CALCIUM 9.0 8.8*   Liver Function Tests No results for input(s): AST, ALT, ALKPHOS, BILITOT, PROT, ALBUMIN in the last 72 hours. No results for input(s): LIPASE, AMYLASE in the last 72 hours. Cardiac Enzymes No results for input(s): CKTOTAL, CKMB, CKMBINDEX, TROPONINI in the last 72 hours.  BNP: BNP (last 3 results) Recent Labs    05/28/21 0105 06/10/21 1210 07/07/21 1530  BNP 785.3* 511.7* 633.1*    ProBNP (last 3 results) No results for input(s): PROBNP in the last 8760 hours.   D-Dimer No results for input(s): DDIMER in the last 72 hours. Hemoglobin A1C No results for input(s): HGBA1C in the last 72 hours. Fasting Lipid Panel No results for input(s): CHOL, HDL, LDLCALC, TRIG, CHOLHDL, LDLDIRECT in the last 72 hours.  Thyroid Function Tests No results for input(s): TSH, T4TOTAL, T3FREE, THYROIDAB in the last 72 hours.  Invalid input(s): FREET3  Other results:   Imaging    No results found.   Medications:     Scheduled Medications:  amLODipine  5 mg Oral Daily   aspirin EC  81 mg Oral Daily   atorvastatin  40 mg Oral Daily   carvedilol  12.5 mg Oral BID WC  DULoxetine  20 mg Oral Daily   heparin  5,000 Units Subcutaneous Q8H   insulin aspart  0-20 Units Subcutaneous TID WC   mirtazapine  15 mg Oral QHS   risperiDONE  3 mg Oral QHS   sodium chloride flush  3 mL Intravenous Q12H   sodium chloride flush  3 mL Intravenous Q12H    Infusions:  sodium chloride Stopped (07/08/21 2102)    PRN Medications: sodium chloride, acetaminophen, ondansetron (ZOFRAN) IV, sodium chloride flush   Assessment/Plan   1. Acute on chronic diastolic CHF: Last echo in 6/22 showed EF 55-60%, moderate LVH, normal RV, dilated IVC.  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  Multiple admissions recently with CHF, complicated by CKD stage IV.  RHC on 7/29 suggested ongoing volume overload with  elevated PCWP and RA.  Prominent v-waves in PCWP but think due to severe diastolic dysfunction as there is no significant MR on review of recent echo.  PYP study was not suggestive of TTR cardiac amyloidosis.  Suspect severe diastolic dysfunction due to long-standing and poorly controlled HTN.  She diuresed well on Lasix 12 mg/hr, now off with creatinine rise. Creatinine higher 3.08 => 3.13 => 3.34 => 3.57.  - Off diuretics, will not start po yet with rise in creatinine today.  Start torsemide tomorrow if creatinine stabilizes.   - Would likely benefit from Cardiomems, will aim to place as outpatient.  2. HTN: Stable on current regmen.  3. AKI on CKD stage IV: Creatinine 3.57 today, higher.   - No diuretic.  - Will watch in hospital until creatinine plateaus and we can restart her po diuretic.  4. Carotid disease: Occluded RICA. - Continue ASA, statin.   Keep today and follow creatinine, if stable tomorrow may be able to go home.   Length of Stay: [place], MD  07/17/2021, 8:20 AM  Advanced Heart Failure Team Pager [phone] (M-F; 7a - 5p)  Please contact [facility] for night-coverage after hours (5p -7a ) and weekends on [email or web address]

## 2021-07-17 NOTE — Plan of Care (Signed)
  Problem: Education: Goal: Knowledge of General Education information will improve Description: Including pain rating scale, medication(s)/side effects and non-pharmacologic comfort measures 07/17/2021 0548 by Marcie Mowers, RN Outcome: Progressing 07/17/2021 0547 by Marcie Mowers, RN Outcome: Progressing   Problem: Clinical Measurements: Goal: Ability to maintain clinical measurements within normal limits will improve 07/17/2021 0548 by Marcie Mowers, RN Outcome: Progressing 07/17/2021 0547 by Marcie Mowers, RN Outcome: Progressing Goal: Will remain free from infection 07/17/2021 0548 by Marcie Mowers, RN Outcome: Progressing 07/17/2021 0547 by Marcie Mowers, RN Outcome: Progressing Goal: Respiratory complications will improve 07/17/2021 0548 by Marcie Mowers, RN Outcome: Progressing 07/17/2021 0547 by Marcie Mowers, RN Outcome: Progressing Goal: Cardiovascular complication will be avoided Outcome: Progressing   Problem: Activity: Goal: Risk for activity intolerance will decrease 07/17/2021 0548 by Marcie Mowers, RN Outcome: Progressing 07/17/2021 0547 by Marcie Mowers, RN Outcome: Progressing   Problem: Nutrition: Goal: Adequate nutrition will be maintained Outcome: Progressing   Problem: Pain Managment: Goal: General experience of comfort will improve Outcome: Progressing   Problem: Skin Integrity: Goal: Risk for impaired skin integrity will decrease Outcome: Progressing

## 2021-07-17 NOTE — Progress Notes (Signed)
Orthopedic Tech Progress Note Patient Details:  Nancy [name] [birth year] [id number]  Ortho Devices Type of Ortho Device: Haematologist Ortho Device/Splint Location: BLE Ortho Device/Splint Interventions: Ordered, Application, Adjustment   Post Interventions Patient Tolerated: Well Instructions Provided: Care of [place] 07/17/2021, 4:25 PM

## 2021-07-17 NOTE — Progress Notes (Signed)
CARDIAC REHAB PHASE I   PRE:  Rate/Rhythm: 69 SR  BP:  Supine: 121/78  Sitting:   Standing:    SaO2: 99%RA  MODE:  Ambulation: 150 ft   POST:  Rate/Rhythm: 76 SR  BP:  Supine:   Sitting: 125/72  Standing:    SaO2: 99%RA 0810-0847 Pt walked 150 ft on RA with gait belt use, rolling walker and asst x 1 with slow steady gait. Stopped twice to rest. Pt stated distance limited by back and not SOB. To recliner with call bell. Gave pt low sodium diets and discussed 2000 mg sodium restriction. Encouraged daily weights and discussed when to call MD with weight gain or signs of CHF.Discussed 2L FR. Pt stated she has scales at home. Gave pt warm blanket and got comfortable in recliner. Removed purewick and pt to call staff when she needs to go to bathroom. Pt stated she quit smoking in September. Congratulated pt on this.   Graylon Good, RN BSN  07/17/2021 8:44 AM

## 2021-07-17 NOTE — Progress Notes (Signed)
Unna boots changed by ortho tech after washing legs with soap and water.

## 2021-07-18 DIAGNOSIS — I5031 Acute diastolic (congestive) heart failure: Secondary | ICD-10-CM

## 2021-07-18 LAB — GLUCOSE, CAPILLARY
Glucose-Capillary: 118 mg/dL — ABNORMAL HIGH (ref 70–99)
Glucose-Capillary: 167 mg/dL — ABNORMAL HIGH (ref 70–99)
Glucose-Capillary: 163 mg/dL — ABNORMAL HIGH (ref 70–99)
Glucose-Capillary: 191 mg/dL — ABNORMAL HIGH (ref 70–99)

## 2021-07-18 LAB — BASIC METABOLIC PANEL
Anion gap: 10 (ref 5–15)
BUN: 70 mg/dL — ABNORMAL HIGH (ref 8–23)
CO2: 30 mmol/L (ref 22–32)
Calcium: 9 mg/dL (ref 8.9–10.3)
Chloride: 94 mmol/L — ABNORMAL LOW (ref 98–111)
Creatinine, Ser: 3.68 mg/dL — ABNORMAL HIGH (ref 0.44–1.00)
GFR, Estimated: 13 mL/min — ABNORMAL LOW (ref >60)
Glucose, Bld: 146 mg/dL — ABNORMAL HIGH (ref 70–99)
Potassium: 4.6 mmol/L (ref 3.5–5.1)
Sodium: 134 mmol/L — ABNORMAL LOW (ref 135–145)

## 2021-07-18 NOTE — Progress Notes (Signed)
Physical Therapy Treatment Patient Details Name: Nancy [name] MRN: [id number] DOB: 1961-11-22 Today's Date: 07/18/2021    History of Present Illness 60 y.o. female presents to [facility] ED from PCP due to DOE and orthopnea. Pt admitted for management of CHF. Pt underwent R heart cath on 07/14/2021. PMH includes anemia, depression, DM, peripheral neuropathy, HLD, HTN, CVAm CHF.    PT Comments    Pt tolerated treatment well with VSS throughout session (see below). Pt demonstrated increased ambulation distance, limited by fatigue. Pt educated on energy conservation and breathing techniques. Encourage to increase ambulation distance and recommend walking program for home.  Pre-vitals - HR 69, spO2 97% on RA, BP 139/72(93) Vitals during ambulation - max HR 74, spO2 90-94% on RA Post-vitals - HR 68, BP 138/58(80), spO2 99  Follow Up Recommendations  No PT follow up     Equipment Recommendations  None recommended by PT    Recommendations for Other Services       Precautions / Restrictions Precautions Precautions: Fall Restrictions Weight Bearing Restrictions: No    Mobility  Bed Mobility               General bed mobility comments: Pt in recliner upon arrival    Transfers Overall transfer level: Needs assistance Equipment used: Rolling walker (2 wheeled) Transfers: Sit to/from Stand Sit to Stand: Supervision         General transfer comment: Required supervision with assist with leads and RW. Demonstrated good hand placement with transfer  Ambulation/Gait Ambulation/Gait assistance: Supervision Gait Distance (Feet): 175 Feet Assistive device: Rolling walker (2 wheeled) Gait Pattern/deviations: Step-through pattern Gait velocity: decreased Gait velocity interpretation: 1.31 - 2.62 ft/sec, indicative of limited community ambulator General Gait Details: Pt mentioned feeling fatigued and required seated rest after 132ft while ambulating back to room. Walked an  additional 50 ft back to room.   Stairs             Wheelchair Mobility    Modified Rankin (Stroke Patients Only)       Balance Overall balance assessment: Needs assistance     Sitting balance - Comments: Pt in recliner upon entry     Standing balance-Leahy Scale: Poor Standing balance comment: BUE support on RW                            Cognition Arousal/Alertness: Awake/alert Behavior During Therapy: WFL for tasks assessed/performed Overall Cognitive Status: No family/caregiver present to determine baseline cognitive functioning                                 General Comments: Follows multi-step commands with accuracy      Exercises      General Comments General comments (skin integrity, edema, etc.): VSS on RA      Pertinent Vitals/Pain Pain Assessment: No/denies pain    Home Living                      Prior Function            PT Goals (current goals can now be found in the care plan section) Acute Rehab PT Goals Patient Stated Goal: to go home PT Goal Formulation: With patient Time For Goal Achievement: 07/30/21 Potential to Achieve Goals: Good Progress towards PT goals: Progressing toward goals    Frequency    Min 3X/week  PT Plan      Co-evaluation              AM-PAC PT "6 Clicks" Mobility   Outcome Measure  Help needed turning from your back to your side while in a flat bed without using bedrails?: A Little Help needed moving from lying on your back to sitting on the side of a flat bed without using bedrails?: A Little Help needed moving to and from a bed to a chair (including a wheelchair)?: A Little Help needed standing up from a chair using your arms (e.g., wheelchair or bedside chair)?: A Little Help needed to walk in hospital room?: A Little Help needed climbing 3-5 steps with a railing? : A Lot 6 Click Score: 17    End of Session   Activity Tolerance: Patient limited by  fatigue Patient left: in chair;with call bell/phone within reach;with chair alarm set Nurse Communication: Mobility status PT Visit Diagnosis: Other abnormalities of gait and mobility (R26.89);Muscle weakness (generalized) (M62.81);Pain     Time: 6950-7225 PT Time Calculation (min) (ACUTE ONLY): 23 min  Charges:  $Gait Training: 8-22 mins $Therapeutic Activity: 8-22 mins                     Louie Casa, SPT Acute Rehab: [phone]    Domingo Dimes 07/18/2021, 12:18 PM

## 2021-07-18 NOTE — TOC CM/SW Note (Signed)
HF TOC CM spoke to pt at bedside. Explained her meds will come up from [place] or she can pick up at [facility]. Pt states she has never used MATCH. Explained the program is available once per year with a $3 copay for meds. Match entered for scheduled dc on 07/19/2021. Delivered pt's wedge pillow to room. Pillow was paid by HF fund and supplied by [place]. [place], [place] ED TOC CM [phone]

## 2021-07-18 NOTE — Progress Notes (Signed)
CARDIAC REHAB PHASE I   PRE:  Rate/Rhythm: 71 SR  BP:  Supine: 144/72  Sitting:   Standing:    SaO2: 90%RA  MODE:  Ambulation: 150 ft   POST:  Rate/Rhythm: 79 SR  BP:  Supine:   Sitting: 127/58  Standing:    SaO2: 97%RA 0750-0807 Pt walked 150 ft on RA with rolling walker independently. Stopped twice to rest . To recliner with call bell. Tolerated well.   Graylon Good, RN BSN  07/18/2021 8:05 AM

## 2021-07-18 NOTE — Progress Notes (Addendum)
Patient ID: Nancy [name], female   DOB: Oct 07, 1961, 60 y.o.   MRN: [id number]     Advanced Heart Failure Rounding Note  PCP-Cardiologist: Quay Burow, MD   Subjective:   Creatinine 3.08 => 3.13 => 3.34 => 3.57=>3.7.  Diuretics have been on hold.   Complaining dizziness when standing. Able to walk 150 feet.    RHC Procedural Findings (7/29): Hemodynamics (mmHg) RA mean 13 RV 68/19 PA 68/22, mean 42 PCWP mean 25 with v-waves to 46 Oxygen saturations: PA 56% AO 98% Cardiac Output (Fick) 5.44  Cardiac Index (Fick) 2.65 PVR 3.1 WU  Objective:   Weight Range: 98.8 kg Body mass index is 37.39 kg/m.   Vital Signs:   Temp:  [97.6 F (36.4 C)-98.1 F (36.7 C)] 98.1 F (36.7 C) (08/02 0738) Pulse Rate:  [64-76] 69 (08/02 0738) Resp:  [12-18] 15 (08/02 0738) BP: (119-147)/(57-74) 144/72 (08/02 0738) SpO2:  [90 %-98 %] 98 % (08/02 0738) Weight:  [98.8 kg] 98.8 kg (08/02 0326) Last BM Date: 07/15/21  Weight change: Filed Weights   07/16/21 0400 07/17/21 0500 07/18/21 0326  Weight: 99.2 kg 98.9 kg 98.8 kg    Intake/Output:   Intake/Output Summary (Last 24 hours) at 07/18/2021 0823 Last data filed at 07/18/2021 2505 Gross per 24 hour  Intake 203 ml  Output 1700 ml  Net -1497 ml      Physical Exam    General:  Sitting in the chair. No resp difficulty HEENT: normal Neck: supple. JVP 6-7  Carotids 2+ bilat; no bruits. No lymphadenopathy or thryomegaly appreciated. Cor: PMI nondisplaced. Regular rate & rhythm. No rubs, gallops or murmurs. Lungs: clear Abdomen: soft, nontender, nondistended. No hepatosplenomegaly. No bruits or masses. Good bowel sounds. Extremities: no cyanosis, clubbing, rash, edema Neuro: alert & orientedx3, cranial nerves grossly intact. moves all 4 extremities w/o difficulty. Affect pleasant  Telemetry   NSR personally reviewed.   Labs    CBC No results for input(s): WBC, NEUTROABS, HGB, HCT, MCV, PLT in the last 72 hours.  Basic  Metabolic Panel Recent Labs    07/17/21 0004 07/18/21 0056  NA 135 134*  K 4.6 4.6  CL 94* 94*  CO2 32 30  GLUCOSE 168* 146*  BUN 67* 70*  CREATININE 3.57* 3.68*  CALCIUM 8.8* 9.0   Liver Function Tests No results for input(s): AST, ALT, ALKPHOS, BILITOT, PROT, ALBUMIN in the last 72 hours. No results for input(s): LIPASE, AMYLASE in the last 72 hours. Cardiac Enzymes No results for input(s): CKTOTAL, CKMB, CKMBINDEX, TROPONINI in the last 72 hours.  BNP: BNP (last 3 results) Recent Labs    05/28/21 0105 06/10/21 1210 07/07/21 1530  BNP 785.3* 511.7* 633.1*    ProBNP (last 3 results) No results for input(s): PROBNP in the last 8760 hours.   D-Dimer No results for input(s): DDIMER in the last 72 hours. Hemoglobin A1C No results for input(s): HGBA1C in the last 72 hours. Fasting Lipid Panel No results for input(s): CHOL, HDL, LDLCALC, TRIG, CHOLHDL, LDLDIRECT in the last 72 hours.  Thyroid Function Tests No results for input(s): TSH, T4TOTAL, T3FREE, THYROIDAB in the last 72 hours.  Invalid input(s): FREET3  Other results:   Imaging    No results found.   Medications:     Scheduled Medications:  amLODipine  5 mg Oral Daily   aspirin EC  81 mg Oral Daily   atorvastatin  40 mg Oral Daily   carvedilol  12.5 mg Oral BID WC   DULoxetine  20 mg Oral Daily   heparin  5,000 Units Subcutaneous Q8H   insulin aspart  0-20 Units Subcutaneous TID WC   mirtazapine  15 mg Oral QHS   risperiDONE  3 mg Oral QHS   sodium chloride flush  3 mL Intravenous Q12H   sodium chloride flush  3 mL Intravenous Q12H    Infusions:  sodium chloride Stopped (07/08/21 2102)    PRN Medications: sodium chloride, acetaminophen, ondansetron (ZOFRAN) IV, sodium chloride flush   Assessment/Plan   1. Acute on chronic diastolic CHF: Last echo in 6/22 showed EF 55-60%, moderate LVH, normal RV, dilated IVC.  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  Multiple admissions recently with  CHF, complicated by CKD stage IV.  RHC on 7/29 suggested ongoing volume overload with elevated PCWP and RA.  Prominent v-waves in PCWP but think due to severe diastolic dysfunction as there is no significant MR on review of recent echo.  PYP study was not suggestive of TTR cardiac amyloidosis.  Suspect severe diastolic dysfunction due to long-standing and poorly controlled HTN.  She diuresed well on Lasix 12 mg/hr, now off with creatinine rise. Creatinine higher 3.08 => 3.13 => 3.34 => 3.57=>3.7   - Check orthostatics.  - Off diuretics, will not start po yet with rise in creatinine today.   - Would likely benefit from Cardiomems, will aim to place as outpatient.  2. HTN: Stable.  3. AKI on CKD stage IV: Creatinine 3.7 today, higher.   - No diuretic.  - Will watch in hospital until creatinine plateaus and we can restart her po diuretic.  4. Carotid disease: Occluded RICA. - Continue ASA, statin.   Length of Stay: [place], NP  07/18/2021, 8:23 AM  Advanced Heart Failure Team Pager [phone] (M-F; 7a - 5p)  Please contact [facility] for night-coverage after hours (5p -7a ) and weekends on [email or web address]  Patient seen with NP, agree with the above note.   Volume stable.  Creatinine up again to 3.7.    Watch today, hopefully plateau tomorrow and to po diuretic and home.   Loralie Champagne 07/18/2021 8:51 AM

## 2021-07-19 DIAGNOSIS — N179 Acute kidney failure, unspecified: Secondary | ICD-10-CM

## 2021-07-19 LAB — BASIC METABOLIC PANEL
Anion gap: 8 (ref 5–15)
BUN: 75 mg/dL — ABNORMAL HIGH (ref 8–23)
CO2: 31 mmol/L (ref 22–32)
Calcium: 9 mg/dL (ref 8.9–10.3)
Chloride: 96 mmol/L — ABNORMAL LOW (ref 98–111)
Creatinine, Ser: 3.68 mg/dL — ABNORMAL HIGH (ref 0.44–1.00)
GFR, Estimated: 13 mL/min — ABNORMAL LOW (ref >60)
Glucose, Bld: 155 mg/dL — ABNORMAL HIGH (ref 70–99)
Potassium: 5.2 mmol/L — ABNORMAL HIGH (ref 3.5–5.1)
Sodium: 135 mmol/L (ref 135–145)

## 2021-07-19 LAB — GLUCOSE, CAPILLARY
Glucose-Capillary: 100 mg/dL — ABNORMAL HIGH (ref 70–99)
Glucose-Capillary: 158 mg/dL — ABNORMAL HIGH (ref 70–99)
Glucose-Capillary: 204 mg/dL — ABNORMAL HIGH (ref 70–99)
Glucose-Capillary: 209 mg/dL — ABNORMAL HIGH (ref 70–99)

## 2021-07-19 MED ORDER — SODIUM ZIRCONIUM CYCLOSILICATE 10 G PO PACK
10.0000 g | PACK | Freq: Once | ORAL | Status: AC
  Administered 2021-07-19: 10 g via ORAL
  Filled 2021-07-19: qty 1

## 2021-07-19 NOTE — Progress Notes (Signed)
CARDIAC REHAB PHASE I   PRE:  Rate/Rhythm: 68 SR  BP:  Supine: 137/70  Sitting: 121/63  Standing: 127/66   SaO2: 95%RA  MODE:  Ambulation: 250 ft   POST:  Rate/Rhythm: 76 SR  BP:  Supine:   Sitting: 127/55  Standing:    SaO2: 95%RA 5501-5868 Orthostatics as above. Pt denied dizziness standing. Stated dizziness better today when walking. Stopped several times to rest due to lower back pain. To recliner with call bell after walk. Walked 250 ft on RA with rolling walker.   Graylon Good, RN BSN  07/19/2021 8:49 AM

## 2021-07-19 NOTE — Progress Notes (Addendum)
Patient ID: Nancy [name], female   DOB: Nov 03, 1961, 60 y.o.   MRN: [id number]     Advanced Heart Failure Rounding Note  PCP-Cardiologist: Quay Burow, MD   Subjective:   Creatinine 3.08 => 3.13 => 3.34 => 3.57=>3.7=>3.7  Diuretics have been on hold.   Having mild dizziness.   RHC Procedural Findings (7/29): Hemodynamics (mmHg) RA mean 13 RV 68/19 PA 68/22, mean 42 PCWP mean 25 with v-waves to 46 Oxygen saturations: PA 56% AO 98% Cardiac Output (Fick) 5.44  Cardiac Index (Fick) 2.65 PVR 3.1 WU  Objective:   Weight Range: 93.7 kg Body mass index is 35.46 kg/m.   Vital Signs:   Temp:  [97.9 F (36.6 C)-98.1 F (36.7 C)] 98 F (36.7 C) (08/03 0449) Pulse Rate:  [63-71] 66 (08/03 0648) Resp:  [10-14] 13 (08/03 0449) BP: (112-153)/(55-75) 112/55 (08/03 0648) SpO2:  [91 %-97 %] 93 % (08/03 0449) Weight:  [93.7 kg] 93.7 kg (08/03 0449) Last BM Date: 07/15/21  Weight change: Filed Weights   07/17/21 0500 07/18/21 0326 07/19/21 0449  Weight: 98.9 kg 98.8 kg 93.7 kg    Intake/Output:   Intake/Output Summary (Last 24 hours) at 07/19/2021 0757 Last data filed at 07/19/2021 0450 Gross per 24 hour  Intake 484 ml  Output 1300 ml  Net -816 ml      Physical Exam   General: No resp difficulty HEENT: normal Neck: supple. JVP 5-6 . Carotids 2+ bilat; no bruits. No lymphadenopathy or thryomegaly appreciated. Cor: PMI nondisplaced. Regular rate & rhythm. No rubs, gallops or murmurs. Lungs: clear Abdomen: soft, nontender, nondistended. No hepatosplenomegaly. No bruits or masses. Good bowel sounds. Extremities: no cyanosis, clubbing, rash, edema Neuro: alert & orientedx3, cranial nerves grossly intact. moves all 4 extremities w/o difficulty. Affect pleasant  Telemetry  NSR personally reviewed.   Labs    CBC No results for input(s): WBC, NEUTROABS, HGB, HCT, MCV, PLT in the last 72 hours.  Basic Metabolic Panel Recent Labs    07/18/21 0056 07/19/21 0020   NA 134* 135  K 4.6 5.2*  CL 94* 96*  CO2 30 31  GLUCOSE 146* 155*  BUN 70* 75*  CREATININE 3.68* 3.68*  CALCIUM 9.0 9.0   Liver Function Tests No results for input(s): AST, ALT, ALKPHOS, BILITOT, PROT, ALBUMIN in the last 72 hours. No results for input(s): LIPASE, AMYLASE in the last 72 hours. Cardiac Enzymes No results for input(s): CKTOTAL, CKMB, CKMBINDEX, TROPONINI in the last 72 hours.  BNP: BNP (last 3 results) Recent Labs    05/28/21 0105 06/10/21 1210 07/07/21 1530  BNP 785.3* 511.7* 633.1*    ProBNP (last 3 results) No results for input(s): PROBNP in the last 8760 hours.   D-Dimer No results for input(s): DDIMER in the last 72 hours. Hemoglobin A1C No results for input(s): HGBA1C in the last 72 hours. Fasting Lipid Panel No results for input(s): CHOL, HDL, LDLCALC, TRIG, CHOLHDL, LDLDIRECT in the last 72 hours.  Thyroid Function Tests No results for input(s): TSH, T4TOTAL, T3FREE, THYROIDAB in the last 72 hours.  Invalid input(s): FREET3  Other results:   Imaging    No results found.   Medications:     Scheduled Medications:  amLODipine  5 mg Oral Daily   aspirin EC  81 mg Oral Daily   atorvastatin  40 mg Oral Daily   carvedilol  12.5 mg Oral BID WC   DULoxetine  20 mg Oral Daily   heparin  5,000 Units Subcutaneous Q8H  insulin aspart  0-20 Units Subcutaneous TID WC   mirtazapine  15 mg Oral QHS   risperiDONE  3 mg Oral QHS   sodium chloride flush  3 mL Intravenous Q12H   sodium chloride flush  3 mL Intravenous Q12H    Infusions:  sodium chloride Stopped (07/08/21 2102)    PRN Medications: sodium chloride, acetaminophen, ondansetron (ZOFRAN) IV, sodium chloride flush   Assessment/Plan   1. Acute on chronic diastolic CHF: Last echo in 6/22 showed EF 55-60%, moderate LVH, normal RV, dilated IVC.  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  Multiple admissions recently with CHF, complicated by CKD stage IV.  RHC on 7/29 suggested  ongoing volume overload with elevated PCWP and RA.  Prominent v-waves in PCWP but think due to severe diastolic dysfunction as there is no significant MR on review of recent echo.  PYP study was not suggestive of TTR cardiac amyloidosis.  Suspect severe diastolic dysfunction due to long-standing and poorly controlled HTN.  She diuresed well on Lasix 12 mg/hr, now off with creatinine rise. Creatinine higher 3.08 => 3.13 => 3.34 => 3.57=>3.7 =>3.7   - Continue to hold diuretics.   -  Would likely benefit from Cardiomems, will aim to place as outpatient.  2. HTN: Stable.  3. AKI on CKD stage IV: Creatinine 3.7>3.7  today.    - No diuretic.  - Will watch in hospital until creatinine plateaus and we can restart her po diuretic.  4. Carotid disease: Occluded RICA. - Continue ASA, statin. 5. Hyperkalemia  K 5.2.  Give dose of lokelma   Length of Stay: [place], NP  07/19/2021, 7:57 AM  Advanced Heart Failure Team Pager [phone] (M-F; 7a - 5p)  Please contact [facility] for night-coverage after hours (5p -7a ) and weekends on [email or web address]  Patient seen NP, agree with the above note.    Creatinine plateaued but BUN higher and K high.  Will give Lokelma today, hopefully numbers trend down tomorrow and we can safely restart torsemide and let her go home.   Volume status ok.   Loralie Champagne 07/19/2021 8:36 AM

## 2021-07-20 ENCOUNTER — Inpatient Hospital Stay (HOSPITAL_COMMUNITY): Payer: Medicaid Other

## 2021-07-20 LAB — GLUCOSE, CAPILLARY
Glucose-Capillary: 103 mg/dL — ABNORMAL HIGH (ref 70–99)
Glucose-Capillary: 175 mg/dL — ABNORMAL HIGH (ref 70–99)
Glucose-Capillary: 168 mg/dL — ABNORMAL HIGH (ref 70–99)
Glucose-Capillary: 156 mg/dL — ABNORMAL HIGH (ref 70–99)

## 2021-07-20 LAB — BASIC METABOLIC PANEL
Anion gap: 9 (ref 5–15)
BUN: 79 mg/dL — ABNORMAL HIGH (ref 8–23)
CO2: 29 mmol/L (ref 22–32)
Calcium: 8.9 mg/dL (ref 8.9–10.3)
Chloride: 95 mmol/L — ABNORMAL LOW (ref 98–111)
Creatinine, Ser: 3.85 mg/dL — ABNORMAL HIGH (ref 0.44–1.00)
GFR, Estimated: 13 mL/min — ABNORMAL LOW (ref >60)
Glucose, Bld: 158 mg/dL — ABNORMAL HIGH (ref 70–99)
Potassium: 4.7 mmol/L (ref 3.5–5.1)
Sodium: 133 mmol/L — ABNORMAL LOW (ref 135–145)

## 2021-07-20 MED ORDER — TORSEMIDE 20 MG PO TABS
40.0000 mg | ORAL_TABLET | Freq: Every day | ORAL | Status: DC
Start: 2021-07-20 — End: 2021-07-21
  Administered 2021-07-20: 40 mg via ORAL
  Filled 2021-07-20: qty 2

## 2021-07-20 MED ORDER — MAGNESIUM HYDROXIDE 400 MG/5ML PO SUSP
30.0000 mL | Freq: Every day | ORAL | Status: DC | PRN
  Administered 2021-07-20: 30 mL via ORAL
  Filled 2021-07-20: qty 30

## 2021-07-20 NOTE — Consult Note (Signed)
Nephrology Consult   Requesting provider: Loralie Champagne, MD Service requesting consult: Heat failure Reason for consult: AKI on CKD   Assessment/Recommendations: Nancy [name] is a/an 60 y.o. female with a past medical history obesity, heart failure, HLD, DM 2, HTN, CKD who presents with heart failure exacerbation c/b AKI on CKD  Non-Oliguric AKI on CKD 4, unstable worsening: Likely secondary to hemodynamic shifts associated with diuresis. Likely temperamental kidneys given h/o significant proteinuria. Agree with holding diuretics for now and hopefully monitor for renal recovery.  -Agree with holding diuretics -Continue to monitor daily Cr, Dose meds for GFR -Monitor Daily I/Os, Daily weight  -Maintain MAP>65 for optimal renal perfusion.  -Avoid nephrotoxic medications including NSAIDs and Vanc/Zosyn combo -Check Renal U/S to rule out obstruction -Obtain urinalysis -Currently no indication for HD  Heart failure exacerbation: Agree with holding diuretics for now.  Primary team felt euvolemia has been achieved.  Defer management to heart failure team  Hypertension: Blood pressure improved since admission.  Continue current medications  Anemia of chronic disease: Hemoglobin 9.5.  Likely multifactorial.  Continue to monitor  Diabetes mellitus type 2: Poorly controlled in the past.  More recently well controlled.  Continue management per primary team  Hyponatremia: Sodium 133.  Likely related to heart failure and AKI.  No changes at this time  Recommendations conveyed to primary service.    [facility] 07/20/2021 9:01 AM   _____________________________________________________________________________________ CC: AKI on CKD  History of Present Illness: Nancy [name] is a/an 60 y.o. female with a past medical history of obesity, heart failure, HLD, DM 2, HTN, CKD who presents with heart failure exacerbation.  Patient initially presented on  7/22 after being evaluated in cardiology clinic for volume overload.  She had been endorsing worsening dyspnea on exertion and orthopnea.  In the outpatient setting she had been on torsemide 40 mg daily and was recently increased to 60 mg daily by her nephrologist, Dr. Hollie Salk.  Despite these changes she continued to accumulate fluid.  She was also very weak at that time.  Did not have significant chest pain.  Possibly some nausea.  Since admission she has been aggressively diuresed with weight improving from 108 kg to 98 kg.  Overall her symptoms have improved.  Creatinines have fluctuated widely in the past.  Creatinine was around 1.8-2 around March and May 2022.  More recently creatinines have been around 2.5-3.  On arrival to the hospital her creatinine was 2.6.  Creatinine has steadily risen with diuresis.  Despite stopping diuresis couple days ago the creatinine has continued to rise and was 3.9 today.  Patient has had extensive proteinuria in the past likely related to history of diabetes causing diabetic kidney disease.  She had RHC on 7/29 with showed preserved cardiac output with elevated right left filling pressures and pulmonary venous htn.  Patient states she is feeling a lot better. No significant SOB or orthopnea. No n/v. Urine has been clear. No dysuria or hematuria.   Medications:  Current Facility-Administered Medications  Medication Dose Route Frequency Provider Last Rate Last Admin   0.9 %  sodium chloride infusion  250 mL Intravenous PRN Tommie Raymond, NP   Stopped at 07/08/21 2102   acetaminophen (TYLENOL) tablet 650 mg  650 mg Oral Q4H PRN Kathyrn Drown D, NP   650 mg at 07/16/21 2101   amLODipine (NORVASC) tablet 5 mg  5 mg Oral Daily Kroeger, Krista M., PA-C   5 mg at 07/19/21  4098   aspirin EC tablet 81 mg  81 mg Oral Daily [name], MD   81 mg at 07/19/21 0908   atorvastatin (LIPITOR) tablet 40 mg  40 mg Oral Daily Lyda Jester M, PA-C   40 mg at 07/19/21  1191   carvedilol (COREG) tablet 12.5 mg  12.5 mg Oral BID WC Larey Dresser, MD   12.5 mg at 07/20/21 4782   DULoxetine (CYMBALTA) DR capsule 20 mg  20 mg Oral Daily Lyndee Leo, RPH   20 mg at 07/19/21 9562   heparin injection 5,000 Units  5,000 Units Subcutaneous Q8H Kathyrn Drown D, NP   5,000 Units at 07/20/21 1308   insulin aspart (novoLOG) injection 0-20 Units  0-20 Units Subcutaneous TID WC Satira Sark, MD   7 Units at 07/19/21 1639   mirtazapine (REMERON) tablet 15 mg  15 mg Oral QHS Lyndee Leo, RPH   15 mg at 07/19/21 2121   ondansetron (ZOFRAN) injection 4 mg  4 mg Intravenous Q6H PRN Kathyrn Drown D, NP       risperiDONE (RISPERDAL) tablet 3 mg  3 mg Oral QHS Lyndee Leo, RPH   3 mg at 07/19/21 2121   sodium chloride flush (NS) 0.9 % injection 3 mL  3 mL Intravenous Q12H Kathyrn Drown D, NP   3 mL at 07/19/21 2122   sodium chloride flush (NS) 0.9 % injection 3 mL  3 mL Intravenous PRN Kathyrn Drown D, NP       sodium chloride flush (NS) 0.9 % injection 3 mL  3 mL Intravenous Q12H Clegg, Amy D, NP   3 mL at 07/19/21 0912   torsemide (DEMADEX) tablet 40 mg  40 mg Oral Daily Larey Dresser, MD         ALLERGIES Elemental sulfur, Hydralazine hcl, Hydrocodone, Metformin and related, Other, Plaquenil [hydroxychloroquine sulfate], Shellfish-derived products, Shrimp (diagnostic), and Sulfa antibiotics  MEDICAL HISTORY Past Medical History:  Diagnosis Date   Anemia 2006   Depression 2014   previously on amitryptiline    Diabetes mellitus with neurological manifestation ([place]) 2006   Diabetic peripheral neuropathy ([place]) 04/26/2020   Fracture of left ankle 1997    Gastroparesis 07/2016   HOH (hard of hearing) 2004    Hyperlipidemia 2006   Hypertension 2006   IBS (irritable bowel syndrome) 2002    Leukopenia 2015    Macular degeneration 11/2019   Shingles 2009    Stroke [facility]) 2020   Thyroid nodule 2004     SOCIAL HISTORY Social History    Socioeconomic History   Marital status: Legally Separated    Spouse name: [name]   Number of children: 1   Years of education: some colle   Highest education level: Not on file  Occupational History   Occupation: Employed FT as Landscape architect    Comment: [employer] , NA  Tobacco Use   Smoking status: Former    Packs/day: 0.25    Years: 0.50    Pack years: 0.13    Types: Cigarettes   Smokeless tobacco: Never   Tobacco comments:    quit yrs ago  Vaping Use   Vaping Use: Never used  Substance and Sexual Activity   Alcohol use: No   Drug use: No   Sexual activity: Yes    Birth control/protection: None  Other Topics Concern   Not on file  Social History Narrative   02/18/20 lives alone     Worked full time  in data compensation analysis (high stress before stroke    Social Determinants of Health   Financial Resource Strain: High Risk   Difficulty of Paying Living Expenses: Hard  Food Insecurity: Food Insecurity Present   Worried About Charity fundraiser in the Last Year: Sometimes true   Ran Out of Food in the Last Year: Sometimes true  Transportation Needs: No Transportation Needs   Lack of Transportation (Medical): No   Lack of Transportation (Non-Medical): No  Physical Activity: Not on file  Stress: Not on file  Social Connections: Not on file  Intimate Partner Violence: Not on file     FAMILY HISTORY Family History  Problem Relation Age of Onset   Hypertension Mother    Heart disease Mother    Diabetes Mother    Thyroid disease Mother    Congestive Heart Failure Mother    Breast cancer Maternal Grandmother    Colon cancer Maternal Grandfather    Heart attack Sister    Heart disease Brother    Hyperlipidemia Brother    Hypertension Brother    Diabetes Father    Breast cancer Maternal Aunt       Review of Systems: 12 systems reviewed Otherwise as per HPI, all other systems reviewed and negative  Physical Exam: Vitals:   07/20/21 0735 07/20/21 0825   BP:  111/62  Pulse: 70 70  Resp:  15  Temp:  97.8 F (36.6 C)  SpO2:  95%   No intake/output data recorded.  Intake/Output Summary (Last 24 hours) at 07/20/2021 0901 Last data filed at 07/20/2021 0614 Gross per 24 hour  Intake --  Output 1350 ml  Net -1350 ml   General: well-appearing, no acute distress HEENT: anicteric sclera, oropharynx clear without lesions CV: regular rate, no audible rub, trace to 1+ pitting edema in the BLE wrapped in Ace wraps Lungs: clear to auscultation bilaterally, normal work of breathing Abd: soft, non-tender, non-distended Skin: no visible lesions or rashes Psych: alert, engaged, appropriate mood and affect Musculoskeletal: no obvious deformities Neuro: normal speech, no gross focal deficits   Test Results Reviewed Lab Results  Component Value Date   NA 133 (L) 07/20/2021   K 4.7 07/20/2021   CL 95 (L) 07/20/2021   CO2 29 07/20/2021   BUN 79 (H) 07/20/2021   CREATININE 3.85 (H) 07/20/2021   CALCIUM 8.9 07/20/2021   ALBUMIN 3.5 (L) 06/30/2021   PHOS 3.7 05/11/2021     I have reviewed all relevant outside healthcare records related to the patient's current hospitalization

## 2021-07-20 NOTE — Progress Notes (Signed)
Physical Therapy Treatment Patient Details Name: Nancy [name] MRN: [id number] DOB: 08-14-1961 Today's Date: 07/20/2021    History of Present Illness 60 y.o. female presents to [facility] ED from PCP due to DOE and orthopnea. Pt admitted for management of CHF. Pt underwent R heart cath on 07/14/2021. PMH includes anemia, depression, DM, peripheral neuropathy, HLD, HTN, CVAm CHF.    PT Comments    Pt demonstrated decreased assist level with bed mobility and increased ambulation distance compared to last session. Pt mentioned feeling fatigue and dizzy during session, limiting ambulation distance, with VSS (see below).  Pt educated on home walking program, energy conservation techniques, and use of rollator.  Pre-vitals - HR 71, BP 120/61 Vitals during ambulation - HR 74, BP 111/89 (98)  Follow Up Recommendations  No PT follow up     Equipment Recommendations  Other (comment) (rollator)    Recommendations for Other Services       Precautions / Restrictions Precautions Precautions: Fall Restrictions Weight Bearing Restrictions: No    Mobility  Bed Mobility Overal bed mobility: Needs Assistance Bed Mobility: Supine to Sit     Supine to sit: Supervision;HOB elevated     General bed mobility comments: supervision for lines with increased time    Transfers Overall transfer level: Needs assistance   Transfers: Sit to/from Stand Sit to Stand: Supervision         General transfer comment: cues for brake use of rollator and assist for lines. Pt with sit to stand x 3 trials  Ambulation/Gait Ambulation/Gait assistance: Supervision Gait Distance (Feet): 200 Feet Assistive device: 4-wheeled walker Gait Pattern/deviations: Step-through pattern;Decreased stride length;Trunk flexed   Gait velocity interpretation: 1.31 - 2.62 ft/sec, indicative of limited community ambulator General Gait Details: cues for posture and brake use with transfers. Pt ambulated 85', 140', 200'  respectively with seated rest between each. Pt stated fatigue and dizziness with VSS   Stairs             Wheelchair Mobility    Modified Rankin (Stroke Patients Only)       Balance Overall balance assessment: Needs assistance   Sitting balance-Leahy Scale: Good Sitting balance - Comments: EOb without assist   Standing balance support: Single extremity supported;Bilateral upper extremity supported Standing balance-Leahy Scale: Poor Standing balance comment: UE support in standing                            Cognition Arousal/Alertness: Awake/alert Behavior During Therapy: WFL for tasks assessed/performed Overall Cognitive Status: Within Functional Limits for tasks assessed                                        Exercises General Exercises - Lower Extremity Long Arc Quad: AROM;Both;Seated;15 reps Hip Flexion/Marching: AROM;Both;Seated;15 reps    General Comments        Pertinent Vitals/Pain Pain Assessment: No/denies pain    Home Living                      Prior Function            PT Goals (current goals can now be found in the care plan section) Progress towards PT goals: Progressing toward goals    Frequency    Min 3X/week      PT Plan Current plan remains appropriate    Co-evaluation  AM-PAC PT "6 Clicks" Mobility   Outcome Measure  Help needed turning from your back to your side while in a flat bed without using bedrails?: None Help needed moving from lying on your back to sitting on the side of a flat bed without using bedrails?: A Little Help needed moving to and from a bed to a chair (including a wheelchair)?: A Little Help needed standing up from a chair using your arms (e.g., wheelchair or bedside chair)?: A Little Help needed to walk in hospital room?: A Little Help needed climbing 3-5 steps with a railing? : A Little 6 Click Score: 19    End of Session Equipment Utilized  During Treatment: Gait belt Activity Tolerance: Patient tolerated treatment well Patient left: in chair;with call bell/phone within reach Nurse Communication: Mobility status PT Visit Diagnosis: Other abnormalities of gait and mobility (R26.89);Muscle weakness (generalized) (M62.81)     Time: 4888-9169 PT Time Calculation (min) (ACUTE ONLY): 36 min  Charges:  $Gait Training: 8-22 mins $Therapeutic Activity: 8-22 mins                     Louie Casa, SPT Acute Rehab: [phone]    Domingo Dimes 07/20/2021, 10:03 AM

## 2021-07-20 NOTE — Progress Notes (Addendum)
Patient ID: Nancy [name], female   DOB: 1961-09-14, 60 y.o.   MRN: [id number]     Advanced Heart Failure Rounding Note  PCP-Cardiologist: Quay Burow, MD HF: Dr. Aundra Dubin  Subjective:   Creatinine 3.08 => 3.13 => 3.34 => 3.57=>3.7=>3.7=>3.85.  Diuretics have been on hold.   Denies dyspnea. No CP. ? Weight up 13lb overnight?  RHC, 7/29: Hemodynamics (mmHg) RA mean 13 RV 68/19 PA 68/22, mean 42 PCWP mean 25 with v-waves to 46 Oxygen saturations: PA 56% AO 98% Cardiac Output (Fick) 5.44  Cardiac Index (Fick) 2.65 PVR 3.1 WU  Objective:   Weight Range: 98.4 kg Body mass index is 37.24 kg/m.   Vital Signs:   Temp:  [97.7 F (36.5 C)-98.1 F (36.7 C)] 97.8 F (36.6 C) (08/04 0323) Pulse Rate:  [63-69] 63 (08/04 0323) Resp:  [11-19] 14 (08/04 0323) BP: (95-140)/(61-79) 95/79 (08/04 0323) SpO2:  [93 %-100 %] 97 % (08/04 0323) Weight:  [98.4 kg] 98.4 kg (08/04 0613) Last BM Date: 07/15/21  Weight change: Filed Weights   07/18/21 0326 07/19/21 0449 07/20/21 6222  Weight: 98.8 kg 93.7 kg 98.4 kg    Intake/Output:   Intake/Output Summary (Last 24 hours) at 07/20/2021 0703 Last data filed at 07/20/2021 9798 Gross per 24 hour  Intake --  Output 1350 ml  Net -1350 ml      Physical Exam  General: No resp difficulty HEENT: normal Neck: supple. JVP ~ 6 cm. Carotids 2+ bilat; no bruits. No lymphadenopathy or thryomegaly appreciated. Cor: PMI nondisplaced. Regular rate & rhythm. No rubs, gallops or murmurs. Lungs: CTA Abdomen: soft, nontender, nondistended. No hepatosplenomegaly. No bruits or masses. Good bowel sounds. Extremities: no cyanosis, clubbing, rash, edema. ACE wraps on. Neuro: alert & orientedx3, cranial nerves grossly intact. moves all 4 extremities w/o difficulty. Affect pleasant  Telemetry  NSR. 60s (Personally reviewed)  Labs    CBC No results for input(s): WBC, NEUTROABS, HGB, HCT, MCV, PLT in the last 72 hours.  Basic Metabolic  Panel Recent Labs    07/19/21 0020 07/20/21 0150  NA 135 133*  K 5.2* 4.7  CL 96* 95*  CO2 31 29  GLUCOSE 155* 158*  BUN 75* 79*  CREATININE 3.68* 3.85*  CALCIUM 9.0 8.9   Liver Function Tests No results for input(s): AST, ALT, ALKPHOS, BILITOT, PROT, ALBUMIN in the last 72 hours. No results for input(s): LIPASE, AMYLASE in the last 72 hours. Cardiac Enzymes No results for input(s): CKTOTAL, CKMB, CKMBINDEX, TROPONINI in the last 72 hours.  BNP: BNP (last 3 results) Recent Labs    05/28/21 0105 06/10/21 1210 07/07/21 1530  BNP 785.3* 511.7* 633.1*    ProBNP (last 3 results) No results for input(s): PROBNP in the last 8760 hours.   D-Dimer No results for input(s): DDIMER in the last 72 hours. Hemoglobin A1C No results for input(s): HGBA1C in the last 72 hours. Fasting Lipid Panel No results for input(s): CHOL, HDL, LDLCALC, TRIG, CHOLHDL, LDLDIRECT in the last 72 hours.  Thyroid Function Tests No results for input(s): TSH, T4TOTAL, T3FREE, THYROIDAB in the last 72 hours.  Invalid input(s): FREET3  Other results:   Imaging    No results found.   Medications:     Scheduled Medications:  amLODipine  5 mg Oral Daily   aspirin EC  81 mg Oral Daily   atorvastatin  40 mg Oral Daily   carvedilol  12.5 mg Oral BID WC   DULoxetine  20 mg Oral Daily  heparin  5,000 Units Subcutaneous Q8H   insulin aspart  0-20 Units Subcutaneous TID WC   mirtazapine  15 mg Oral QHS   risperiDONE  3 mg Oral QHS   sodium chloride flush  3 mL Intravenous Q12H   sodium chloride flush  3 mL Intravenous Q12H    Infusions:  sodium chloride Stopped (07/08/21 2102)    PRN Medications: sodium chloride, acetaminophen, ondansetron (ZOFRAN) IV, sodium chloride flush   Assessment/Plan   1. Acute on chronic diastolic CHF:  -Last echo in 6/22 showed EF 55-60%, moderate LVH, normal RV, dilated IVC.  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  Multiple admissions recently with CHF,  complicated by CKD stage IV.  RHC on 7/29 suggested ongoing volume overload with elevated PCWP and RA.  Prominent v-waves in PCWP but think due to severe diastolic dysfunction as there is no significant MR on review of recent echo.  PYP study was not suggestive of TTR cardiac amyloidosis.  Suspect severe diastolic dysfunction due to long-standing and poorly controlled HTN.   -She diuresed well on Lasix 12 mg/hr, now off with creatinine rise. Creatinine higher 3.08 => 3.13 => 3.34 => 3.57=>3.7 =>3.7=>3.85   -Continue to hold diuretics.   -Would likely benefit from Cardiomems, will aim to place as outpatient. Discussed with patient.  -Close f/u after discharge. 2. HTN:  -Just one low SBP this am, otherwise BP has been stable.  -Avoid hypotension with stage IV CKD. 3. AKI on CKD stage IV:  -Creatinine 3.7>3.7>3.85  -Will consult nephrology d/t worsening renal function -No diuretic.  -Will watch in hospital until creatinine plateaus and we can restart her po diuretic.  4. Carotid disease:  -Occluded RICA. -Continue ASA, statin. 5. Hyperkalemia  -Lokelma yesterday. K 5.2 > 4.7.  Length of Stay: [address], LINDSAY N, PA-C  07/20/2021, 7:03 AM  Advanced Heart Failure Team Pager [phone] (M-F; 7a - 5p)  Please contact [facility] for night-coverage after hours (5p -7a ) and weekends on [email or web address]  Patient seen with PA, agree with the above note.   No complaints today, but creatinine higher again at 3.85.    General: NAD Neck: JVP 9-10 cm, no thyromegaly or thyroid nodule.  Lungs: Clear to auscultation bilaterally with normal respiratory effort. CV: Nondisplaced PMI.  Heart regular S1/S2, no S3/S4, no murmur.  No peripheral edema.   Abdomen: Soft, nontender, no hepatosplenomegaly, no distention.  Skin: Intact without lesions or rashes.  Neurologic: Alert and oriented x 3.  Psych: Normal affect. Extremities: No clubbing or cyanosis.  HEENT: Normal.   Now looks mildly volume  overloaded again.  However, creatinine still slowly rising.  - I am going to start her back on torsemide 40 mg daily, follow creatinine tomorrow.  - I am going to ask nephrology to see her at this point.  She will need outpatient nephrology followup.   Loralie Champagne 07/20/2021 10:35 AM

## 2021-07-20 NOTE — Progress Notes (Signed)
Orthopedic Tech Progress Note Patient Details:  Nancy [name] 1961/10/15 [id number]  Ortho Devices Type of Ortho Device: Haematologist Ortho Device/Splint Location: Bilateral Ortho Device/Splint Interventions: Ordered, Application   Post Interventions Patient Tolerated: Well Instructions Provided: Care of device  Yasmyn Bellisario A Aarian Griffie 07/20/2021, 5:05 PM

## 2021-07-21 ENCOUNTER — Other Ambulatory Visit: Payer: Self-pay

## 2021-07-21 ENCOUNTER — Other Ambulatory Visit (HOSPITAL_COMMUNITY): Payer: Self-pay

## 2021-07-21 LAB — CBC
HCT: 30.4 % — ABNORMAL LOW (ref 36.0–46.0)
Hemoglobin: 9.5 g/dL — ABNORMAL LOW (ref 12.0–15.0)
MCH: 28.3 pg (ref 26.0–34.0)
MCHC: 31.3 g/dL (ref 30.0–36.0)
MCV: 90.5 fL (ref 80.0–100.0)
Platelets: 197 10*3/uL (ref 150–400)
RBC: 3.36 MIL/uL — ABNORMAL LOW (ref 3.87–5.11)
RDW: 14.5 % (ref 11.5–15.5)
WBC: 7.7 10*3/uL (ref 4.0–10.5)
nRBC: 0 % (ref 0.0–0.2)

## 2021-07-21 LAB — GLUCOSE, CAPILLARY
Glucose-Capillary: 110 mg/dL — ABNORMAL HIGH (ref 70–99)
Glucose-Capillary: 147 mg/dL — ABNORMAL HIGH (ref 70–99)
Glucose-Capillary: 138 mg/dL — ABNORMAL HIGH (ref 70–99)
Glucose-Capillary: 166 mg/dL — ABNORMAL HIGH (ref 70–99)

## 2021-07-21 LAB — BASIC METABOLIC PANEL
Anion gap: 9 (ref 5–15)
BUN: 80 mg/dL — ABNORMAL HIGH (ref 8–23)
CO2: 29 mmol/L (ref 22–32)
Calcium: 9.2 mg/dL (ref 8.9–10.3)
Chloride: 97 mmol/L — ABNORMAL LOW (ref 98–111)
Creatinine, Ser: 4 mg/dL — ABNORMAL HIGH (ref 0.44–1.00)
GFR, Estimated: 12 mL/min — ABNORMAL LOW (ref >60)
Glucose, Bld: 158 mg/dL — ABNORMAL HIGH (ref 70–99)
Potassium: 5.4 mmol/L — ABNORMAL HIGH (ref 3.5–5.1)
Sodium: 135 mmol/L (ref 135–145)

## 2021-07-21 MED ORDER — CARVEDILOL 12.5 MG PO TABS
12.5000 mg | ORAL_TABLET | Freq: Two times a day (BID) | ORAL | 0 refills | Status: DC
  Filled 2021-07-21: qty 60, 30d supply, fill #0

## 2021-07-21 MED ORDER — ATORVASTATIN CALCIUM 40 MG PO TABS
40.0000 mg | ORAL_TABLET | Freq: Every day | ORAL | 1 refills | Status: DC
  Filled 2021-07-21: qty 30, 30d supply, fill #0

## 2021-07-21 MED ORDER — AMLODIPINE BESYLATE 5 MG PO TABS
5.0000 mg | ORAL_TABLET | Freq: Every day | ORAL | 0 refills | Status: DC
  Filled 2021-07-21: qty 30, 30d supply, fill #0

## 2021-07-21 MED ORDER — PATIROMER SORBITEX CALCIUM 8.4 G PO PACK
8.4000 g | PACK | Freq: Two times a day (BID) | ORAL | Status: AC
  Administered 2021-07-21 (×2): 8.4 g via ORAL
  Filled 2021-07-21 (×2): qty 1

## 2021-07-21 MED ORDER — TORSEMIDE 20 MG PO TABS
40.0000 mg | ORAL_TABLET | Freq: Every day | ORAL | 0 refills | Status: DC
  Filled 2021-07-21: qty 60, 30d supply, fill #0

## 2021-07-21 MED ORDER — SODIUM CHLORIDE 0.9 % IV SOLN
510.0000 mg | Freq: Once | INTRAVENOUS | Status: AC
  Administered 2021-07-21: 510 mg via INTRAVENOUS
  Filled 2021-07-21: qty 17

## 2021-07-21 NOTE — Progress Notes (Signed)
Physical Therapy Treatment Patient Details Name: Nancy [name] MRN: [id number] DOB: 09/19/1961 Today's Date: 07/21/2021    History of Present Illness 60 y.o. female presents to [facility] ED from PCP due to DOE and orthopnea. Pt admitted for management of CHF. Pt underwent R heart cath on 07/14/2021. PMH includes anemia, depression, DM, peripheral neuropathy, HLD, HTN, CVAm CHF.    PT Comments    Pt supine in bed on arrival this session.  Agreeable to PT session.  Bout of dizziness limited gt distance.    Follow Up Recommendations  No PT follow up     Equipment Recommendations       Recommendations for Other Services       Precautions / Restrictions Precautions Precautions: Fall Restrictions Weight Bearing Restrictions: No    Mobility  Bed Mobility Overal bed mobility: Needs Assistance Bed Mobility: Supine to Sit;Sit to Supine     Supine to sit: Supervision;HOB elevated     General bed mobility comments: supervision for lines with increased time    Transfers Overall transfer level: Needs assistance Equipment used: 4-wheeled walker Transfers: Sit to/from Stand Sit to Stand: Supervision         General transfer comment: cues for brake use of rollator and assist for lines. Pt with sit to stand x 3 trials  Ambulation/Gait Ambulation/Gait assistance: Supervision;Min guard Gait Distance (Feet): 90 Feet (x2 became dizzy after 1st bout) Assistive device: 4-wheeled walker Gait Pattern/deviations: Step-through pattern;Decreased stride length;Trunk flexed     General Gait Details: cues for posture and brake use with transfers.  Pt with dizziness with BP in sitting. 128/51 and in standing 118/46   Stairs             Wheelchair Mobility    Modified Rankin (Stroke Patients Only)       Balance Overall balance assessment: Needs assistance   Sitting balance-Leahy Scale: Good       Standing balance-Leahy Scale: Fair                               Cognition Arousal/Alertness: Awake/alert Behavior During Therapy: WFL for tasks assessed/performed Overall Cognitive Status: Within Functional Limits for tasks assessed                                 General Comments: Follows multi-step commands with accuracy      Exercises      General Comments        Pertinent Vitals/Pain Pain Assessment: No/denies pain    Home Living                      Prior Function            PT Goals (current goals can now be found in the care plan section) Acute Rehab PT Goals Patient Stated Goal: to go home Potential to Achieve Goals: Good Progress towards PT goals: Progressing toward goals    Frequency    Min 3X/week      PT Plan Current plan remains appropriate    Co-evaluation              AM-PAC PT "6 Clicks" Mobility   Outcome Measure  Help needed turning from your back to your side while in a flat bed without using bedrails?: A Little Help needed moving from lying on your back to sitting on the  side of a flat bed without using bedrails?: A Little Help needed moving to and from a bed to a chair (including a wheelchair)?: A Little Help needed standing up from a chair using your arms (e.g., wheelchair or bedside chair)?: A Little Help needed to walk in hospital room?: A Little Help needed climbing 3-5 steps with a railing? : A Little 6 Click Score: 18    End of Session Equipment Utilized During Treatment: Gait belt Activity Tolerance: Patient tolerated treatment well Patient left: in chair;with call bell/phone within reach Nurse Communication: Mobility status PT Visit Diagnosis: Other abnormalities of gait and mobility (R26.89);Muscle weakness (generalized) (M62.81) Pain - part of body:  (back)     Time: 1444-5848 PT Time Calculation (min) (ACUTE ONLY): 22 min  Charges:  $Gait Training: 8-22 mins                     Erasmo Leventhal , PTA Acute Rehabilitation Services Pager  [phone] Office [phone]    Philander Ake Eli Hose 07/21/2021, 4:53 PM

## 2021-07-21 NOTE — Progress Notes (Signed)
Patient ID: Nancy [name], female   DOB: 11/08/1961, 60 y.o.   MRN: [id number] [facility] Progress Note   Assessment/ Plan:   1. Acute kidney Injury on chronic kidney disease stage IV: This appears to be hemodynamically mediated in the setting of diuresis-creatinine slightly higher this morning after torsemide use yesterday.  Based on her volume status, discussed with cardiology to hold diuretics at this time and monitor renal function on labs.  She does not have any indications for hemodialysis at this time. 2.  Hyperkalemia: Secondary to lower GFR; will give Veltassa today for hyperkalemia management (avoiding Lokelma to limit sodium load). 3.  Acute exacerbation of congestive heart failure: Clinically appears to be euvolemic and without any significant respiratory distress, monitor off diuretics at this time. 4.  Anemia: We will check CBC with labs tomorrow to decide on potential targets of therapy as this can be one of the sources of CHF exacerbation.  Low iron saturation noted on labs from 7/15-we will give intravenous iron (selected Feraheme rather than Nulecit to limit volume). 5.  Hypertension: Blood pressure under acceptable control, monitor off diuretics.  Subjective:   Feeling fair and denies any chest pain or shortness of breath this morning.  Yesterday had some dizziness ambulating halls with PT.   Objective:   BP 134/75 (BP Location: Right Arm)   Pulse 68   Temp 98.2 F (36.8 C) (Oral)   Resp 15   Ht 5\' 4"  (1.626 m)   Wt 97.5 kg   SpO2 98%   BMI 36.90 kg/m   Intake/Output Summary (Last 24 hours) at 07/21/2021 4098 Last data filed at 07/21/2021 0404 Gross per 24 hour  Intake --  Output 1250 ml  Net -1250 ml   Weight change: -0.9 kg  Physical Exam: Gen: Comfortably resting in bed, watching television CVS: Pulse regular rhythm, normal rate, S1 and S2 normal Resp: Clear to auscultation bilaterally without rales/rhonchi Abd: Soft, obese, nontender,  bowel sounds normal Ext: Bilateral Ace wraps in place, no significant edema  Imaging: US RENAL  Result Date: 07/20/2021 CLINICAL DATA:  Acute kidney injury EXAM: RENAL / URINARY TRACT ULTRASOUND COMPLETE COMPARISON:  04/21/2020 FINDINGS: Right Kidney: Renal measurements: 11.1 x 5.8 x 5.6 cm = volume: 189 mL. Echogenicity within normal limits. No mass or hydronephrosis visualized. Left Kidney: Renal measurements: 12.6 x 6.2 x 5.4 cm = volume: 221 mL. Echogenicity within normal limits. No mass or hydronephrosis visualized. Bladder: Appears normal for degree of bladder distention. Other: None. IMPRESSION: No evidence of obstructive uropathy. Electronically Signed   By: Davina Poke D.O.   On: 07/20/2021 13:37    Labs: BMET Recent Labs  Lab 07/15/21 0011 07/16/21 0021 07/17/21 0004 07/18/21 0056 07/19/21 0020 07/20/21 0150 07/21/21 0016  NA 137 136 135 134* 135 133* 135  K 4.5 4.4 4.6 4.6 5.2* 4.7 5.4*  CL 96* 94* 94* 94* 96* 95* 97*  CO2 34* 34* 32 30 31 29 29   GLUCOSE 127* 141* 168* 146* 155* 158* 158*  BUN 51* 57* 67* 70* 75* 79* 80*  CREATININE 3.13* 3.34* 3.57* 3.68* 3.68* 3.85* 4.00*  CALCIUM 9.1 9.0 8.8* 9.0 9.0 8.9 9.2   CBC Recent Labs  Lab 07/14/21 0939  HGB 9.5*  9.2*  HCT 28.0*  27.0*    Medications:     amLODipine  5 mg Oral Daily   aspirin EC  81 mg Oral Daily   atorvastatin  40 mg Oral Daily   carvedilol  12.5  mg Oral BID WC   DULoxetine  20 mg Oral Daily   heparin  5,000 Units Subcutaneous Q8H   insulin aspart  0-20 Units Subcutaneous TID WC   mirtazapine  15 mg Oral QHS   patiromer  8.4 g Oral BID   risperiDONE  3 mg Oral QHS   sodium chloride flush  3 mL Intravenous Q12H   sodium chloride flush  3 mL Intravenous Q12H   Elmarie Shiley, MD 07/21/2021, 8:26 AM

## 2021-07-21 NOTE — Progress Notes (Addendum)
Patient ID: Nancy [name], female   DOB: 09-21-1961, 60 y.o.   MRN: [id number]     Advanced Heart Failure Rounding Note  PCP-Cardiologist: Quay Burow, MD HF: Dr. Aundra Dubin  Subjective:   Creatinine 3.08 => 3.13 => 3.34 => 3.57=>3.7=>3.7=>3.85 => 4.00  K 5.4  BP okay  No dyspnea or chest pain. Some dizziness noted when ambulating halls with PT.   RHC, 7/29: Hemodynamics (mmHg) RA mean 13 RV 68/19 PA 68/22, mean 42 PCWP mean 25 with v-waves to 46 Oxygen saturations: PA 56% AO 98% Cardiac Output (Fick) 5.44  Cardiac Index (Fick) 2.65 PVR 3.1 WU  Objective:   Weight Range: 97.5 kg Body mass index is 36.9 kg/m.   Vital Signs:   Temp:  [97.8 F (36.6 C)-98.5 F (36.9 C)] 98.1 F (36.7 C) (08/05 0402) Pulse Rate:  [67-79] 68 (08/05 0402) Resp:  [13-20] 15 (08/05 0402) BP: (106-151)/(58-90) 106/59 (08/05 0402) SpO2:  [95 %-100 %] 98 % (08/05 0402) Weight:  [97.5 kg] 97.5 kg (08/05 0633) Last BM Date: 07/20/21  Weight change: Filed Weights   07/19/21 0449 07/20/21 0613 07/21/21 4431  Weight: 93.7 kg 98.4 kg 97.5 kg    Intake/Output:   Intake/Output Summary (Last 24 hours) at 07/21/2021 0711 Last data filed at 07/21/2021 0404 Gross per 24 hour  Intake --  Output 1250 ml  Net -1250 ml      Physical Exam  General: No resp difficulty HEENT: normal Neck: supple. JVP ~ 8. Carotids 2+ bilat; no bruits. No lymphadenopathy or thryomegaly appreciated. Cor: PMI nondisplaced. Regular rate & rhythm. No rubs, gallops or murmurs. Lungs: CTA Abdomen: soft, nontender, nondistended. No hepatosplenomegaly. No bruits or masses. Good bowel sounds. Extremities: no cyanosis, clubbing, rash, edema. ACE wraps on. Neuro: alert & orientedx3, cranial nerves grossly intact. moves all 4 extremities w/o difficulty. Affect pleasant  Telemetry  NSR 70s (Personally reviewed)  Labs    CBC No results for input(s): WBC, NEUTROABS, HGB, HCT, MCV, PLT in the last 72 hours.  Basic  Metabolic Panel Recent Labs    07/20/21 0150 07/21/21 0016  NA 133* 135  K 4.7 5.4*  CL 95* 97*  CO2 29 29  GLUCOSE 158* 158*  BUN 79* 80*  CREATININE 3.85* 4.00*  CALCIUM 8.9 9.2   Liver Function Tests No results for input(s): AST, ALT, ALKPHOS, BILITOT, PROT, ALBUMIN in the last 72 hours. No results for input(s): LIPASE, AMYLASE in the last 72 hours. Cardiac Enzymes No results for input(s): CKTOTAL, CKMB, CKMBINDEX, TROPONINI in the last 72 hours.  BNP: BNP (last 3 results) Recent Labs    05/28/21 0105 06/10/21 1210 07/07/21 1530  BNP 785.3* 511.7* 633.1*    ProBNP (last 3 results) No results for input(s): PROBNP in the last 8760 hours.   D-Dimer No results for input(s): DDIMER in the last 72 hours. Hemoglobin A1C No results for input(s): HGBA1C in the last 72 hours. Fasting Lipid Panel No results for input(s): CHOL, HDL, LDLCALC, TRIG, CHOLHDL, LDLDIRECT in the last 72 hours.  Thyroid Function Tests No results for input(s): TSH, T4TOTAL, T3FREE, THYROIDAB in the last 72 hours.  Invalid input(s): FREET3  Other results:   Imaging    US RENAL  Result Date: 07/20/2021 CLINICAL DATA:  Acute kidney injury EXAM: RENAL / URINARY TRACT ULTRASOUND COMPLETE COMPARISON:  04/21/2020 FINDINGS: Right Kidney: Renal measurements: 11.1 x 5.8 x 5.6 cm = volume: 189 mL. Echogenicity within normal limits. No mass or hydronephrosis visualized. Left Kidney: Renal measurements:  12.6 x 6.2 x 5.4 cm = volume: 221 mL. Echogenicity within normal limits. No mass or hydronephrosis visualized. Bladder: Appears normal for degree of bladder distention. Other: None. IMPRESSION: No evidence of obstructive uropathy. Electronically Signed   By: Davina Poke D.O.   On: 07/20/2021 13:37     Medications:     Scheduled Medications:  amLODipine  5 mg Oral Daily   aspirin EC  81 mg Oral Daily   atorvastatin  40 mg Oral Daily   carvedilol  12.5 mg Oral BID WC   DULoxetine  20 mg Oral Daily    heparin  5,000 Units Subcutaneous Q8H   insulin aspart  0-20 Units Subcutaneous TID WC   mirtazapine  15 mg Oral QHS   risperiDONE  3 mg Oral QHS   sodium chloride flush  3 mL Intravenous Q12H   sodium chloride flush  3 mL Intravenous Q12H   torsemide  40 mg Oral Daily    Infusions:  sodium chloride Stopped (07/08/21 2102)    PRN Medications: sodium chloride, acetaminophen, magnesium hydroxide, ondansetron (ZOFRAN) IV, sodium chloride flush   Assessment/Plan   1. Acute on chronic diastolic CHF:  -Last echo in 6/22 showed EF 55-60%, moderate LVH, normal RV, dilated IVC.  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  Multiple admissions recently with CHF, complicated by CKD stage IV.  RHC on 7/29 suggested ongoing volume overload with elevated PCWP and RA.  Prominent v-waves in PCWP but think due to severe diastolic dysfunction as there is no significant MR on review of recent echo.  PYP study was not suggestive of TTR cardiac amyloidosis.  Suspect severe diastolic dysfunction due to long-standing and poorly controlled HTN.   -She diuresed well on Lasix 12 mg/hr, now off with creatinine rise. Volume up yesterday, restarted torsemide at 40 mg daily. Good output. Down 2 lb. JVP ~ 8. Will hold torsemide with Scr continuing to trend up. Creatinine continues to trend up - 3.08 => 3.13 => 3.34 => 3.57=> 3.7 => 3.7=> 3.85=> 4.00 -Would likely benefit from Cardiomems, will aim to place as outpatient. Discussed with patient.  -Close f/u after discharge. 2. HTN:  -BP stable -Avoid hypotension with AKI on stage IV CKD. 3. AKI on CKD stage IV:  -Creatinine 3.7>3.7>3.85>4.0 -Nephrology consulted, appreciate input. AKI felt to be due to hemodynamic shifts from diuresis, temperamental kidneys with significant proteinuria -Renal US with no evidence of obstruction -UA/urine sodium collected -Will watch in hospital until creatinine plateaus  4. Carotid disease:  -Occluded RICA. -Continue ASA,  statin. 5. Hyperkalemia  -K 5.2>4.7>5.4 --Received Lokelma 08/03, repeat today  -Possible D/C over weekend. Will send any new meds at D/C to [place] today. -Clinic f/u will be scheduled  Length of Stay: [address] LINDSAY N, PA-C  07/21/2021, 7:11 AM  Advanced Heart Failure Team Pager [phone] (M-F; 7a - 5p)  Please contact [facility] for night-coverage after hours (5p -7a ) and weekends on [email or web address]  Patient seen with PA, agree with the above note.   Creatinine up to 4 today and K 5.4.  She got torsemide 40 mg x 1 yesterday.   No complaints, walking in hall.  BP controlled.   General: NAD Neck: JVP 8 cm, no thyromegaly or thyroid nodule.  Lungs: Clear to auscultation bilaterally with normal respiratory effort. CV: Nondisplaced PMI.  Heart regular S1/S2, no S3/S4, no murmur.  No peripheral edema.   Abdomen: Soft, nontender, no hepatosplenomegaly, no distention.  Skin: Intact without lesions  or rashes.  Neurologic: Alert and oriented x 3.  Psych: Normal affect. Extremities: No clubbing or cyanosis.  HEENT: Normal.   Will give a dose of Lokelma this morning.  Hold diuretics.  Discussed with renal.  Will need to follow in hospital until renal function stabilizes. She will need a diuretic regimen for home eventually.   Loralie Champagne 07/21/2021 8:26 AM

## 2021-07-22 LAB — CBC
HCT: 28 % — ABNORMAL LOW (ref 36.0–46.0)
Hemoglobin: 8.7 g/dL — ABNORMAL LOW (ref 12.0–15.0)
MCH: 28.2 pg (ref 26.0–34.0)
MCHC: 31.1 g/dL (ref 30.0–36.0)
MCV: 90.6 fL (ref 80.0–100.0)
Platelets: 220 10*3/uL (ref 150–400)
RBC: 3.09 MIL/uL — ABNORMAL LOW (ref 3.87–5.11)
RDW: 14.6 % (ref 11.5–15.5)
WBC: 7.1 10*3/uL (ref 4.0–10.5)
nRBC: 0 % (ref 0.0–0.2)

## 2021-07-22 LAB — RENAL FUNCTION PANEL
Albumin: 3.2 g/dL — ABNORMAL LOW (ref 3.5–5.0)
Anion gap: 8 (ref 5–15)
BUN: 78 mg/dL — ABNORMAL HIGH (ref 8–23)
CO2: 28 mmol/L (ref 22–32)
Calcium: 9 mg/dL (ref 8.9–10.3)
Chloride: 97 mmol/L — ABNORMAL LOW (ref 98–111)
Creatinine, Ser: 4.07 mg/dL — ABNORMAL HIGH (ref 0.44–1.00)
GFR, Estimated: 12 mL/min — ABNORMAL LOW (ref >60)
Glucose, Bld: 173 mg/dL — ABNORMAL HIGH (ref 70–99)
Phosphorus: 5.1 mg/dL — ABNORMAL HIGH (ref 2.5–4.6)
Potassium: 4.9 mmol/L (ref 3.5–5.1)
Sodium: 133 mmol/L — ABNORMAL LOW (ref 135–145)

## 2021-07-22 LAB — GLUCOSE, CAPILLARY
Glucose-Capillary: 90 mg/dL (ref 70–99)
Glucose-Capillary: 208 mg/dL — ABNORMAL HIGH (ref 70–99)
Glucose-Capillary: 156 mg/dL — ABNORMAL HIGH (ref 70–99)
Glucose-Capillary: 194 mg/dL — ABNORMAL HIGH (ref 70–99)

## 2021-07-22 LAB — MAGNESIUM: Magnesium: 3 mg/dL — ABNORMAL HIGH (ref 1.7–2.4)

## 2021-07-22 MED ORDER — DARBEPOETIN ALFA 60 MCG/0.3ML IJ SOSY
60.0000 ug | PREFILLED_SYRINGE | INTRAMUSCULAR | Status: DC
  Administered 2021-07-22: 60 ug via SUBCUTANEOUS
  Filled 2021-07-22: qty 0.3

## 2021-07-22 NOTE — Plan of Care (Signed)

## 2021-07-22 NOTE — Progress Notes (Signed)
Progress Note  Patient Name: Nancy [name] Date of Encounter: 07/22/2021  Primary Cardiologist:   Quay Burow, MD   Subjective   No chest pain.  No SOB.   Inpatient Medications    Scheduled Meds:  amLODipine  5 mg Oral Daily   aspirin EC  81 mg Oral Daily   atorvastatin  40 mg Oral Daily   carvedilol  12.5 mg Oral BID WC   darbepoetin (ARANESP) injection - NON-DIALYSIS  60 mcg Subcutaneous Q Sat-1800   DULoxetine  20 mg Oral Daily   heparin  5,000 Units Subcutaneous Q8H   insulin aspart  0-20 Units Subcutaneous TID WC   mirtazapine  15 mg Oral QHS   risperiDONE  3 mg Oral QHS   sodium chloride flush  3 mL Intravenous Q12H   sodium chloride flush  3 mL Intravenous Q12H   Continuous Infusions:  sodium chloride Stopped (07/08/21 2102)   PRN Meds: sodium chloride, acetaminophen, magnesium hydroxide, ondansetron (ZOFRAN) IV, sodium chloride flush   Vital Signs    Vitals:   07/22/21 0300 07/22/21 0406 07/22/21 0627 07/22/21 0720  BP:  130/73 114/66 110/61  Pulse:  62 65 64  Resp: 17 14  14   Temp:  98.2 F (36.8 C)  98 F (36.7 C)  TempSrc:  Oral  Oral  SpO2:  97%  97%  Weight:   97.6 kg   Height:        Intake/Output Summary (Last 24 hours) at 07/22/2021 0916 Last data filed at 07/22/2021 0900 Gross per 24 hour  Intake 480 ml  Output 1700 ml  Net -1220 ml   Filed Weights   07/20/21 0613 07/21/21 0633 07/22/21 0627  Weight: 98.4 kg 97.5 kg 97.6 kg    Telemetry    NSR - Personally Reviewed  ECG    NA - Personally Reviewed  Physical Exam   GEN: No acute distress.   Neck: No  JVD Cardiac: RRR, no murmurs, rubs, or gallops.  Respiratory: Clear  to auscultation bilaterally. GI: Soft, nontender, non-distended  MS:    Mild leg edema; No deformity. Neuro:  Nonfocal  Psych: Normal affect   Labs    Chemistry Recent Labs  Lab 07/20/21 0150 07/21/21 0016 07/22/21 0020  NA 133* 135 133*  K 4.7 5.4* 4.9  CL 95* 97* 97*  CO2 29 29 28    GLUCOSE 158* 158* 173*  BUN 79* 80* 78*  CREATININE 3.85* 4.00* 4.07*  CALCIUM 8.9 9.2 9.0  ALBUMIN  --   --  3.2*  GFRNONAA 13* 12* 12*  ANIONGAP 9 9 8      Hematology Recent Labs  Lab 07/21/21 0921 07/22/21 0020  WBC 7.7 7.1  RBC 3.36* 3.09*  HGB 9.5* 8.7*  HCT 30.4* 28.0*  MCV 90.5 90.6  MCH 28.3 28.2  MCHC 31.3 31.1  RDW 14.5 14.6  PLT 197 220    Cardiac EnzymesNo results for input(s): TROPONINI in the last 168 hours. No results for input(s): TROPIPOC in the last 168 hours.   BNPNo results for input(s): BNP, PROBNP in the last 168 hours.   DDimer No results for input(s): DDIMER in the last 168 hours.   Radiology    US RENAL  Result Date: 07/20/2021 CLINICAL DATA:  Acute kidney injury EXAM: RENAL / URINARY TRACT ULTRASOUND COMPLETE COMPARISON:  04/21/2020 FINDINGS: Right Kidney: Renal measurements: 11.1 x 5.8 x 5.6 cm = volume: 189 mL. Echogenicity within normal limits. No mass or hydronephrosis visualized. Left Kidney:  Renal measurements: 12.6 x 6.2 x 5.4 cm = volume: 221 mL. Echogenicity within normal limits. No mass or hydronephrosis visualized. Bladder: Appears normal for degree of bladder distention. Other: None. IMPRESSION: No evidence of obstructive uropathy. Electronically Signed   By: Davina Poke D.O.   On: 07/20/2021 13:37    Cardiac Studies   Echo   1. Left ventricular ejection fraction, by estimation, is 55 to 60%. The  left ventricle has normal function. The left ventricle has no regional  wall motion abnormalities. There is moderate left ventricular hypertrophy.  Left ventricular diastolic  parameters are indeterminate.   2. Right ventricular systolic function is normal. The right ventricular  size is mildly enlarged. There is mildly elevated pulmonary artery  systolic pressure. The estimated right ventricular systolic pressure is  09.8 mmHg.   3. The mitral valve is normal in structure. No evidence of mitral valve  regurgitation. No evidence  of mitral stenosis.   4. Tricuspid valve regurgitation is mild to moderate.   5. The aortic valve is tricuspid. Aortic valve regurgitation is not  visualized. No aortic stenosis is present.   6. The inferior vena cava is dilated in size with <50% respiratory  variability, suggesting right atrial pressure of 15 mmHg.     Right Heart  Right Heart Pressures RHC Procedural Findings: Hemodynamics (mmHg) RA mean 13 RV 68/19 PA 68/22, mean 42 PCWP mean 25 with v-waves to 46  Oxygen saturations: PA 56% AO 98%  Cardiac Output (Fick) 5.44  Cardiac Index (Fick) 2.65 PVR 3.1 WU     Patient Profile     60 y.o. female acute on chronic diastolic heart failure with a history of hypertension, type 2 diabetes mellitus, nonobstructive CAD and CKD stage IV  Assessment & Plan    ACUTE ON CHRONIC DIASTOLIC HF:   Net negative 19 liters.   Holding diuretic.  Creat relatively stable.  No change in therapy.  Continue to follow.  Discharge if creat continues to fall.      HTN:    BP controlled.      AKI:   Creat is stable but still above baseline.   Renal ultrasound without acute findings.    HYPERKALEMIA:   Improved post intervention.      For questions or updates, please contact [place] Please consult [email or web address] for contact info under Cardiology/STEMI.   Signed, Minus Breeding, MD  07/22/2021, 9:16 AM

## 2021-07-22 NOTE — Progress Notes (Signed)
Patient ID: Nancy [name], female   DOB: [birth year], 60 y.o.   MRN: [id number] [facility] Progress Note   Assessment/ Plan:   1. Acute kidney Injury on chronic kidney disease stage IV: This appears to be hemodynamically mediated in the setting of diuresis-creatinine appears to have plateaued overnight off of torsemide with 1.7 L urine output.  Continue to hold torsemide at this time. 2.  Hyperkalemia: Secondary to lower GFR; corrected with Veltassa yesterday. 3.  Acute exacerbation of congestive heart failure: Clinically appears to be euvolemic and without any significant respiratory distress, monitor off diuretics at this time. 4.  Anemia: Status post initiation of IV iron-add darbepoetin today. 5.  Hypertension: Blood pressure under acceptable control, monitor off diuretics.  Subjective:   Continues to feel well and denies any chest pain or shortness of breath.   Objective:   BP 110/61 (BP Location: Right Arm)   Pulse 64   Temp 98 F (36.7 C) (Oral)   Resp 14   Ht 5\' 4"  (1.626 m)   Wt 97.6 kg   SpO2 97%   BMI 36.93 kg/m   Intake/Output Summary (Last 24 hours) at 07/22/2021 0836 Last data filed at 07/22/2021 0630 Gross per 24 hour  Intake 240 ml  Output 1700 ml  Net -1460 ml   Weight change: 0.1 kg  Physical Exam: Gen: Comfortably resting in bed, watching television CVS: Pulse regular rhythm, normal rate, S1 and S2 normal Resp: Clear to auscultation bilaterally without rales/rhonchi Abd: Soft, obese, nontender, bowel sounds normal Ext: Bilateral Ace wraps in place, no significant edema  Imaging: US RENAL  Result Date: 07/20/2021 CLINICAL DATA:  Acute kidney injury EXAM: RENAL / URINARY TRACT ULTRASOUND COMPLETE COMPARISON:  04/21/2020 FINDINGS: Right Kidney: Renal measurements: 11.1 x 5.8 x 5.6 cm = volume: 189 mL. Echogenicity within normal limits. No mass or hydronephrosis visualized. Left Kidney: Renal measurements: 12.6 x 6.2 x 5.4 cm = volume: 221 mL.  Echogenicity within normal limits. No mass or hydronephrosis visualized. Bladder: Appears normal for degree of bladder distention. Other: None. IMPRESSION: No evidence of obstructive uropathy. Electronically Signed   By: Davina Poke D.O.   On: 07/20/2021 13:37    Labs: BMET Recent Labs  Lab 07/16/21 0021 07/17/21 0004 07/18/21 0056 07/19/21 0020 07/20/21 0150 07/21/21 0016 07/22/21 0020  NA 136 135 134* 135 133* 135 133*  K 4.4 4.6 4.6 5.2* 4.7 5.4* 4.9  CL 94* 94* 94* 96* 95* 97* 97*  CO2 34* 32 30 31 29 29 28   GLUCOSE 141* 168* 146* 155* 158* 158* 173*  BUN 57* 67* 70* 75* 79* 80* 78*  CREATININE 3.34* 3.57* 3.68* 3.68* 3.85* 4.00* 4.07*  CALCIUM 9.0 8.8* 9.0 9.0 8.9 9.2 9.0  PHOS  --   --   --   --   --   --  5.1*   CBC Recent Labs  Lab 07/21/21 0921 07/22/21 0020  WBC 7.7 7.1  HGB 9.5* 8.7*  HCT 30.4* 28.0*  MCV 90.5 90.6  PLT 197 220    Medications:     amLODipine  5 mg Oral Daily   aspirin EC  81 mg Oral Daily   atorvastatin  40 mg Oral Daily   carvedilol  12.5 mg Oral BID WC   DULoxetine  20 mg Oral Daily   heparin  5,000 Units Subcutaneous Q8H   insulin aspart  0-20 Units Subcutaneous TID WC   mirtazapine  15 mg Oral QHS   risperiDONE  3 mg Oral QHS   sodium chloride flush  3 mL Intravenous Q12H   sodium chloride flush  3 mL Intravenous Q12H   Elmarie Shiley, MD 07/22/2021, 8:36 AM

## 2021-07-22 NOTE — Progress Notes (Signed)
CARDIAC REHAB PHASE I   PRE:  Rate/Rhythm: 72SR  BP:  Supine: 124/68  Sitting:   Standing:    SaO2: 97 RA  MODE:  Ambulation: 260 ft   POST:  Rate/Rhythm: 80 SR  BP:  Supine: 131/72  Sitting:   Standing:    SaO2: 97 RA  Pt tolerated exercise well and AMB 260 Ft with standby assist. Pt used rollator Armel Rabbani and needed 1 sitting rest break for about 3 minutes due to pain in her back, legs and feet. No c/o CP or SOB. Pt left in recliner with call bell in reach.    [id number] Kirby Funk ACSM-EP 07/22/2021 1:29 PM

## 2021-07-23 LAB — BASIC METABOLIC PANEL
Anion gap: 11 (ref 5–15)
BUN: 81 mg/dL — ABNORMAL HIGH (ref 8–23)
CO2: 25 mmol/L (ref 22–32)
Calcium: 9 mg/dL (ref 8.9–10.3)
Chloride: 100 mmol/L (ref 98–111)
Creatinine, Ser: 3.99 mg/dL — ABNORMAL HIGH (ref 0.44–1.00)
GFR, Estimated: 12 mL/min — ABNORMAL LOW (ref >60)
Glucose, Bld: 152 mg/dL — ABNORMAL HIGH (ref 70–99)
Potassium: 4.5 mmol/L (ref 3.5–5.1)
Sodium: 136 mmol/L (ref 135–145)

## 2021-07-23 LAB — GLUCOSE, CAPILLARY
Glucose-Capillary: 99 mg/dL (ref 70–99)
Glucose-Capillary: 148 mg/dL — ABNORMAL HIGH (ref 70–99)
Glucose-Capillary: 184 mg/dL — ABNORMAL HIGH (ref 70–99)

## 2021-07-23 MED ORDER — TORSEMIDE 20 MG PO TABS
40.0000 mg | ORAL_TABLET | Freq: Every day | ORAL | 0 refills | Status: DC
  Filled 2021-07-23: qty 60, 30d supply, fill #0

## 2021-07-23 NOTE — Discharge Summary (Signed)
Discharge Summary    Patient ID: Nancy [name] MRN: [id number]; DOB: Feb 15, 1961  Admit date: 07/07/2021 Discharge date: 07/23/2021  PCP:  [name], MD   [facility] Cardiologist:  Quay Burow, MD  Advanced Heart Failure:  Loralie Champagne, MD       Discharge Diagnoses    Active Problems:   CHF (congestive heart failure) [facility])    Diagnostic Studies/Procedures    R HEART CATH:  07/14/2021 1. Elevated right and left heart filling pressures. 2. Prominent v-wave in PCWP tracing. 3. Primarily pulmonary venous hypertension. 4. Preserved cardiac output.  RHC Procedural Findings: Hemodynamics (mmHg) RA mean 13 RV 68/19 PA 68/22, mean 42 PCWP mean 25 with v-waves to 46  Oxygen saturations: PA 56% AO 98%  Cardiac Output (Fick) 5.44  Cardiac Index (Fick) 2.65 PVR 3.1 WU _____________   History of Present Illness     Nancy [name] is a 60 y.o. female with history of diastolic CHF, hypertension, CKD and carotid disease, who was admitted on 07/07/2021 for acute on chronic diastolic CHF and AKI.    Hospital Course     Consultants: Nephrology, Heart Failure  1. Acute on chronic diastolic CHF: -Last echo in 6/22 showed EF 55-60%, moderate LVH, normal RV, dilated IVC.  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  Multiple admissions recently with CHF, complicated by CKD stage IV.    RHC on 7/29 suggested ongoing volume overload with elevated PCWP and RA.  Prominent v-waves in PCWP but think due to severe diastolic dysfunction as there is no significant MR on review of recent echo.  PYP study was not suggestive of TTR cardiac amyloidosis.  Suspect severe diastolic dysfunction due to long-standing and poorly controlled HTN.    -She diuresed well on Lasix 12 mg/hr, off since 7/30 with creatinine rise. Volume up 8/4, restarted torsemide at 40 mg daily.  But it was discontinued on 8/5 due to worsening renal function.  Good output from the torsemide,  restart as an outpatient in a few days. Creatinine continued to trend up - 3.08 => 3.13 => 3.34 => 3.57=> 3.7 => 3.7=> 3.85=> 4.00 => 3.99 at discharge. -Would likely benefit from Cardiomems, will aim to place as outpatient. Discussed with patient. -Close f/u after discharge.  She has an appointment at the CHF clinic on 8/19.  2. HTN: -BP stable -Avoid hypotension with AKI on stage IV CKD.  3. AKI on CKD stage IV: -Creatinine 3.7>3.7>3.85>4.0> 3.99 -Nephrology consulted, appreciate input. AKI felt to be due to hemodynamic shifts from diuresis, temperamental kidneys with significant proteinuria -Renal US with no evidence of obstruction -UA/urine sodium collected -Urine immunofixation pattern unremarkable, evidence of monoclonal protein is not apparent. -Will watch in hospital until creatinine plateaus, the plateau seems to be in the high threes, followed closely as an outpatient.  4. Carotid disease: -Occluded RICA. -Continue ASA, statin.  5. Hyperkalemia  -K 5.2>4.7>5.4> 4.5 at discharge --Received [facility] 08/03, continue to follow potassium  Did the patient have an acute coronary syndrome (MI, NSTEMI, STEMI, etc) this admission?:  No                               Did the patient have a percutaneous coronary intervention (stent / angioplasty)?:  No.       _____________  Discharge Vitals Blood pressure (!) 112/59, pulse 68, temperature 98.2 F (36.8 C), temperature source Oral, resp. rate 12, height _0  (1.626  m), weight 98.1 kg, SpO2 96 %.  Filed Weights   07/21/21 0633 07/22/21 0627 07/23/21 0417  Weight: 97.5 kg 97.6 kg 98.1 kg    Labs & Radiologic Studies    CBC Recent Labs    07/21/21 0921 07/22/21 0020  WBC 7.7 7.1  HGB 9.5* 8.7*  HCT 30.4* 28.0*  MCV 90.5 90.6  PLT 197 448   Basic Metabolic Panel Recent Labs    07/22/21 0020 07/23/21 0909  NA 133* 136  K 4.9 4.5  CL 97* 100  CO2 28 25  GLUCOSE 173* 152*  BUN 78* 81*  CREATININE 4.07* 3.99*   CALCIUM 9.0 9.0  MG 3.0*  --   PHOS 5.1*  --    Liver Function Tests Recent Labs    07/22/21 0020  ALBUMIN 3.2*   No results for input(s): LIPASE, AMYLASE in the last 72 hours. High Sensitivity Troponin:   No results for input(s): TROPONINIHS in the last 720 hours.  BNP    Component Value Date/Time   BNP 633.1 (H) 07/07/2021 1530  Hemoglobin A1C No results for input(s): HGBA1C in the last 72 hours. Fasting Lipid Panel No results for input(s): CHOL, HDL, LDLCALC, TRIG, CHOLHDL, LDLDIRECT in the last 72 hours. Thyroid Function Tests No results for input(s): TSH, T4TOTAL, T3FREE, THYROIDAB in the last 72 hours.  Invalid input(s): FREET3 _____________  DG Chest 2 View  Result Date: 07/07/2021 CLINICAL DATA:  SOB EXAM: CHEST - 2 VIEW COMPARISON:  June 10, 2021 FINDINGS: The cardiomediastinal silhouette is unchanged and enlarged in contour. Small bilateral pleural effusions. No pneumothorax. Diffuse interstitial prominence. Bibasilar heterogeneous opacities most consistent with atelectasis. Surgical clips project over the RIGHT upper quadrant. No acute osseous abnormality. IMPRESSION: Constellation of findings are favored to reflect pulmonary edema with scattered bibasilar atelectasis and small bilateral pleural effusions. Atypical infection could present similarly. Electronically Signed   By: Valentino Saxon MD   On: 07/07/2021 15:57   NM CARDIAC AMYLOID TUMOR LOC INFLAM SPECT 1 DAY  Result Date: 07/12/2021 CLINICAL DATA:  HEART FAILURE. CONCERN FOR CARDIAC AMYLOIDOSIS. EXAM: NUCLEAR MEDICINE TUMOR LOCALIZATION. PYP CARDIAC AMYLOIDOSIS SCAN WITH SPECT TECHNIQUE: Following intravenous administration of radiopharmaceutical, anterior planar images of the chest were obtained. Regions of interest were placed on the heart and contralateral chest wall for quantitative assessment. Additional SPECT imaging of the chest was obtained. RADIOPHARMACEUTICALS:  19.2 mCi TECHNETIUM 99 PYROPHOSPHATE  FINDINGS: Planar Visual assessment: Anterior planar imaging demonstrates radiotracer uptake within the heart less than than uptake within the adjacent ribs (Grade 1). Quantitative assessment : Quantitative assessment of the cardiac uptake compared to the contralateral chest wall is equal to(H/CL = 1.0). SPECT assessment: SPECT imaging of the chest demonstrates equivocal radiotracer accumulation within the LEFT ventricle. IMPRESSION: Visual and quantitative assessment (grade 1, H/CLL equal 1.0) are equivocal suggestive of transthyretin amyloidosis. Electronically Signed   By: Kerby Moors M.D.   On: 07/12/2021 17:03   CARDIAC CATHETERIZATION  Result Date: 07/14/2021 1. Elevated right and left heart filling pressures. 2. Prominent v-wave in PCWP tracing. 3. Primarily pulmonary venous hypertension. 4. Preserved cardiac output.   US RENAL  Result Date: 07/20/2021 CLINICAL DATA:  Acute kidney injury EXAM: RENAL / URINARY TRACT ULTRASOUND COMPLETE COMPARISON:  04/21/2020 FINDINGS: Right Kidney: Renal measurements: 11.1 x 5.8 x 5.6 cm = volume: 189 mL. Echogenicity within normal limits. No mass or hydronephrosis visualized. Left Kidney: Renal measurements: 12.6 x 6.2 x 5.4 cm = volume: 221 mL. Echogenicity within normal limits. No  mass or hydronephrosis visualized. Bladder: Appears normal for degree of bladder distention. Other: None. IMPRESSION: No evidence of obstructive uropathy. Electronically Signed   By: Davina Poke D.O.   On: 07/20/2021 13:37   Disposition   Pt is being discharged home today in good condition.  Follow-up Plans & Appointments     Follow-up Information     [facility] Follow up.   Why: 08/01/21 _0 :Novella Rob Contact information: [place] [phone] [facility] Follow up on 08/04/2021.   Specialty: Cardiology Why: Advanced Heart Failure Clinic at  [facility] at 12 pm Entrance C  Garage Code [id number] Contact information: [address] [id number] [place] [phone]               Discharge Instructions     ([place]) Call MD:  Anytime you have any of the following symptoms: 1) 3 pound weight gain in 24 hours or 5 pounds in 1 week 2) shortness of breath, with or without a dry hacking cough 3) swelling in the hands, feet or stomach 4) if you have to sleep on extra pillows at night in order to breathe.   Complete by: As directed    Diet - low sodium heart healthy   Complete by: As directed    Diet Carb Modified   Complete by: As directed    Increase activity slowly   Complete by: As directed        Discharge Medications   Allergies as of 07/23/2021       Reactions   Elemental Sulfur Hives, Other (See Comments)   PATIENT STATED THIS, MORE THAN LIKELY, SHOULD HAVE BEEN LOGGED AS "SULFA"   Hydralazine Hcl Other (See Comments)   Hair loss   Hydrocodone Itching, Other (See Comments)   Upset stomach   Metformin And Related Nausea And Vomiting, Other (See Comments)   Stomach pains, also   Other Nausea Only, Other (See Comments)   Lettuce- Does not digest this!!   Plaquenil [hydroxychloroquine Sulfate] Hives   Shellfish-derived Products Nausea Only, Other (See Comments)   Caused an upset stomach   Shrimp (diagnostic) Nausea Only, Other (See Comments)   Upset stomach    Sulfa Antibiotics Hives        Medication List     STOP taking these medications    albuterol 108 (90 Base) MCG/ACT inhaler Commonly known as: VENTOLIN HFA   metoprolol tartrate 50 MG tablet Commonly known as: LOPRESSOR       TAKE these medications    acetaminophen 500 MG tablet Commonly known as: TYLENOL Take 500 mg by mouth every 6 (six) hours as needed for moderate pain or headache.   amLODipine 5 MG tablet Commonly known as: NORVASC Take 1 tablet (5 mg total) by mouth daily. What  changed:  medication strength how much to take   aspirin EC 81 MG tablet Take 1 tablet (81 mg total) by mouth daily.   atorvastatin 40 MG tablet Commonly known as: LIPITOR Take 1 tablet (40 mg total) by mouth daily.   carvedilol 12.5 MG tablet Commonly known as: COREG Take 1 tablet (12.5 mg total) by mouth 2 (two) times daily with a meal.   DULoxetine 20 MG capsule Commonly known as: CYMBALTA TAKE 1 CAPSULE (20 MG TOTAL) BY MOUTH DAILY. What changed: how much to take   glucose blood  test strip Use as instructedCheck blood sugar three times daily. E11.40   insulin glargine 100 UNIT/ML Solostar Pen Commonly known as: LANTUS Inject 55 Units into the skin at bedtime.   mirtazapine 15 MG tablet Commonly known as: REMERON TAKE 1 TABLET (15 MG TOTAL) BY MOUTH AT BEDTIME. What changed: how much to take   OneTouch Verio w/Device Kit Check blood sugar three times daily. E11.40   True Metrix Meter w/Device Kit Check blood sugar three times daily. E11.40   risperiDONE 3 MG tablet Commonly known as: RISPERDAL TAKE 1 TABLET (3 MG TOTAL) BY MOUTH AT BEDTIME. What changed:  how much to take how to take this when to take this   torsemide 20 MG tablet Commonly known as: DEMADEX Take 2 tablets (40 mg total) by mouth daily. Start taking on: July 26, 2021 What changed:  how much to take These instructions start on July 26, 2021. If you are unsure what to do until then, ask your doctor or other care provider.       ASK your doctor about these medications    insulin lispro 100 UNIT/ML KwikPen Commonly known as: HumaLOG KwikPen Inject 4 units before breakfast and dinner.               Durable Medical Equipment  (From admission, onward)           Start     Ordered   07/13/21 0842  For home use only DME Other see comment  Once       Comments: Wedge pillow  Question:  Length of Need  Answer:  Lifetime   07/13/21 0841               Outstanding  Labs/Studies   None  Duration of Discharge Encounter   Greater than 30 minutes including physician time.  Signed, Rosaria Ferries, PA-C 07/23/2021, 5:07 PM

## 2021-07-23 NOTE — Progress Notes (Signed)
Patient ID: Nancy [name], female   DOB: 10/09/1961, 60 y.o.   MRN: [id number] [facility] Progress Note   Assessment/ Plan:   1. Acute kidney Injury on chronic kidney disease stage IV: This appears to be hemodynamically mediated in the setting of diuresis and torsemide on hold after tipping from euvolemic to slightly hypovolemic state.  Labs this morning showing slight improvement of renal function and she might be heading towards renal recovery.  Unfortunately cannot go home today because of transportation challenges and will be monitored overnight with labs tomorrow morning prior to planning discharge home.  She will need close follow-up with labs/nephrology evaluation upon discharge and may possibly be discharged home on torsemide 30 mg daily (her admission dose) or 20 mg twice a day. 2.  Hyperkalemia: Secondary to acute kidney injury and improved with Veltassa/renal diet. 3.  Acute exacerbation of congestive heart failure: Clinically appears to be euvolemic and without any significant respiratory distress, monitor off diuretics at this time. 4.  Anemia: Likely anemia of chronic kidney disease/chronic illness and started on intravenous iron along with darbepoetin.  No overt blood loss. 5.  Hypertension: Blood pressure under acceptable control, monitor off diuretics.  Subjective:   Denies any acute events overnight and continues to feel well without chest pain or shortness of breath.  Urine not completely collected per patient.   Objective:   BP 133/77 (BP Location: Right Arm)   Pulse 64   Temp 98 F (36.7 C) (Oral)   Resp 16   Ht 5\' 4"  (1.626 m)   Wt 98.1 kg   SpO2 94%   BMI 37.12 kg/m   Intake/Output Summary (Last 24 hours) at 07/23/2021 0841 Last data filed at 07/23/2021 0800 Gross per 24 hour  Intake 683 ml  Output 1100 ml  Net -417 ml   Weight change: 0.5 kg  Physical Exam: Gen: Resting comfortably in bed, awaiting assistance to use bathroom CVS: Pulse  regular rhythm, normal rate, S1 and S2 normal Resp: Clear to auscultation bilaterally without rales/rhonchi Abd: Soft, obese, nontender, bowel sounds normal Ext: Without lower extremity edema  Imaging: No results found.  Labs: BMET Recent Labs  Lab 07/17/21 0004 07/18/21 0056 07/19/21 0020 07/20/21 0150 07/21/21 0016 07/22/21 0020  NA 135 134* 135 133* 135 133*  K 4.6 4.6 5.2* 4.7 5.4* 4.9  CL 94* 94* 96* 95* 97* 97*  CO2 32 30 31 29 29 28   GLUCOSE 168* 146* 155* 158* 158* 173*  BUN 67* 70* 75* 79* 80* 78*  CREATININE 3.57* 3.68* 3.68* 3.85* 4.00* 4.07*  CALCIUM 8.8* 9.0 9.0 8.9 9.2 9.0  PHOS  --   --   --   --   --  5.1*   CBC Recent Labs  Lab 07/21/21 0921 07/22/21 0020  WBC 7.7 7.1  HGB 9.5* 8.7*  HCT 30.4* 28.0*  MCV 90.5 90.6  PLT 197 220    Medications:     amLODipine  5 mg Oral Daily   aspirin EC  81 mg Oral Daily   atorvastatin  40 mg Oral Daily   carvedilol  12.5 mg Oral BID WC   darbepoetin (ARANESP) injection - NON-DIALYSIS  60 mcg Subcutaneous Q Sat-1800   DULoxetine  20 mg Oral Daily   heparin  5,000 Units Subcutaneous Q8H   insulin aspart  0-20 Units Subcutaneous TID WC   mirtazapine  15 mg Oral QHS   risperiDONE  3 mg Oral QHS   sodium chloride flush  3 mL Intravenous Q12H   sodium chloride flush  3 mL Intravenous Q12H   Elmarie Shiley, MD 07/23/2021, 8:41 AM

## 2021-07-23 NOTE — Progress Notes (Addendum)
Progress Note  Patient Name: Nancy [name] Date of Encounter: 07/23/2021  Primary Cardiologist:   Quay Burow, MD   Subjective   No pain.  No SOB.  Ambulated in the hallway  Inpatient Medications    Scheduled Meds:  amLODipine  5 mg Oral Daily   aspirin EC  81 mg Oral Daily   atorvastatin  40 mg Oral Daily   carvedilol  12.5 mg Oral BID WC   darbepoetin (ARANESP) injection - NON-DIALYSIS  60 mcg Subcutaneous Q Sat-1800   DULoxetine  20 mg Oral Daily   heparin  5,000 Units Subcutaneous Q8H   insulin aspart  0-20 Units Subcutaneous TID WC   mirtazapine  15 mg Oral QHS   risperiDONE  3 mg Oral QHS   sodium chloride flush  3 mL Intravenous Q12H   sodium chloride flush  3 mL Intravenous Q12H   Continuous Infusions:  sodium chloride Stopped (07/08/21 2102)   PRN Meds: sodium chloride, acetaminophen, magnesium hydroxide, ondansetron (ZOFRAN) IV, sodium chloride flush   Vital Signs    Vitals:   07/22/21 2030 07/23/21 0007 07/23/21 0417 07/23/21 0723  BP:  (!) 143/67 (!) 153/77 133/77  Pulse:  64 63 64  Resp: 14 17 11 16   Temp:  97.9 F (36.6 C) 97.6 F (36.4 C) 98 F (36.7 C)  TempSrc:  Oral Oral Oral  SpO2:  94% 95% 94%  Weight:   98.1 kg   Height:        Intake/Output Summary (Last 24 hours) at 07/23/2021 0841 Last data filed at 07/23/2021 0800 Gross per 24 hour  Intake 683 ml  Output 1100 ml  Net -417 ml   Filed Weights   07/21/21 0633 07/22/21 0627 07/23/21 0417  Weight: 97.5 kg 97.6 kg 98.1 kg    Telemetry    NSR - Personally Reviewed  ECG    NA - Personally Reviewed  Physical Exam   GEN: No  acute distress.   Neck: No  JVD Cardiac: RRR, no murmurs, rubs, or gallops.  Respiratory: Clear  to auscultation bilaterally. GI: Soft, nontender, non-distended, normal bowel sounds  MS:  Mild edema; No deformity.  Legs wrapped Neuro:   Nonfocal  Psych: Oriented and appropriate    Labs    Chemistry Recent Labs  Lab 07/20/21 0150  07/21/21 0016 07/22/21 0020  NA 133* 135 133*  K 4.7 5.4* 4.9  CL 95* 97* 97*  CO2 29 29 28   GLUCOSE 158* 158* 173*  BUN 79* 80* 78*  CREATININE 3.85* 4.00* 4.07*  CALCIUM 8.9 9.2 9.0  ALBUMIN  --   --  3.2*  GFRNONAA 13* 12* 12*  ANIONGAP 9 9 8      Hematology Recent Labs  Lab 07/21/21 0921 07/22/21 0020  WBC 7.7 7.1  RBC 3.36* 3.09*  HGB 9.5* 8.7*  HCT 30.4* 28.0*  MCV 90.5 90.6  MCH 28.3 28.2  MCHC 31.3 31.1  RDW 14.5 14.6  PLT 197 220    Cardiac EnzymesNo results for input(s): TROPONINI in the last 168 hours. No results for input(s): TROPIPOC in the last 168 hours.   BNPNo results for input(s): BNP, PROBNP in the last 168 hours.   DDimer No results for input(s): DDIMER in the last 168 hours.   Radiology    No results found.  Cardiac Studies   Echo   1. Left ventricular ejection fraction, by estimation, is 55 to 60%. The  left ventricle has normal function. The left ventricle has  no regional  wall motion abnormalities. There is moderate left ventricular hypertrophy.  Left ventricular diastolic  parameters are indeterminate.   2. Right ventricular systolic function is normal. The right ventricular  size is mildly enlarged. There is mildly elevated pulmonary artery  systolic pressure. The estimated right ventricular systolic pressure is  60.6 mmHg.   3. The mitral valve is normal in structure. No evidence of mitral valve  regurgitation. No evidence of mitral stenosis.   4. Tricuspid valve regurgitation is mild to moderate.   5. The aortic valve is tricuspid. Aortic valve regurgitation is not  visualized. No aortic stenosis is present.   6. The inferior vena cava is dilated in size with <50% respiratory  variability, suggesting right atrial pressure of 15 mmHg.     Right Heart  Right Heart Pressures RHC Procedural Findings: Hemodynamics (mmHg) RA mean 13 RV 68/19 PA 68/22, mean 42 PCWP mean 25 with v-waves to 46  Oxygen saturations: PA 56% AO  98%  Cardiac Output (Fick) 5.44  Cardiac Index (Fick) 2.65 PVR 3.1 WU     Patient Profile     60 y.o. female acute on chronic diastolic heart failure with a history of hypertension, type 2 diabetes mellitus, nonobstructive CAD and CKD stage IV  Assessment & Plan    ACUTE ON CHRONIC DIASTOLIC HF:   Net negative 19.6 liters.   Net negative 600 cc yesterday holding diuretics.    Hold again today.  Check BMET.  Discussed possibly going home but she has no transportation and would need to be discharged tomorrow.    HTN:    BP controlled.      AKI:   Creat is stable but still above baseline.   Renal ultrasound without acute findings.   BMET pending.    HYPERKALEMIA:   Improved post intervention.     Pending as above.   For questions or updates, please contact [place] Please consult [email or web address] for contact info under Cardiology/STEMI.   Signed, Minus Breeding, MD  07/23/2021, 8:41 AM    Addendum:  Labs OK.  She thinks she can get a ride home.  I am going to send her home without diuretic but she will need follow up early this week in HF Clinic.

## 2021-07-23 NOTE — Progress Notes (Addendum)
PA has placed discharge, patient is not able to afford medications, TOC is closed today, health and wellness not open today, she will not be able to leave today and get appropriate medications, PA is aware, I have called social work waiting on return call to see if any other arrangements can be made so that patient can go home today--TOC has provided daytime meds, patient states she has night meds already at home and will pick up refills at [facility] tomorrow

## 2021-07-24 ENCOUNTER — Telehealth: Payer: Self-pay

## 2021-07-24 ENCOUNTER — Telehealth (HOSPITAL_COMMUNITY): Payer: Self-pay | Admitting: Vascular Surgery

## 2021-07-24 ENCOUNTER — Other Ambulatory Visit: Payer: Self-pay

## 2021-07-24 NOTE — Telephone Encounter (Signed)
Transition Care Management Unsuccessful Follow-up Telephone Call  Date of discharge and from where:  07/23/2021, [facility]   Attempts:  1st Attempt  Reason for unsuccessful TCM follow-up call:  Left voice message on # [phone].  Patient has appointment with Dr Wynetta Emery 08/01/2021.

## 2021-07-25 ENCOUNTER — Telehealth: Payer: Self-pay

## 2021-07-25 ENCOUNTER — Encounter (HOSPITAL_COMMUNITY): Payer: Self-pay

## 2021-07-25 NOTE — Telephone Encounter (Signed)
Reached out to pt to sch a post hosp appt for this week per Dr Aundra Dubin, pt offered multiple appts for Tue, Wed, or Fri however pt refused stating she can not do any of those days. Also offered to move her Fri 8/19 appt up to earlier next week but pt declined, she wants to keep the 8/19 appt, she will start on Torsemide 40 mg Daily

## 2021-07-25 NOTE — Telephone Encounter (Signed)
Transition Care Management Unsuccessful Follow-up Telephone Call  Date of discharge and from where:  07/23/2021, [facility]   Attempts:  2nd Attempt  Reason for unsuccessful TCM follow-up call:  Left voice message on # [phone]    Patient has appointment with Dr Wynetta Emery 08/01/2021

## 2021-07-26 ENCOUNTER — Telehealth: Payer: Self-pay

## 2021-07-26 ENCOUNTER — Encounter (HOSPITAL_COMMUNITY): Payer: Self-pay

## 2021-07-26 NOTE — Telephone Encounter (Signed)
Transition Care Management Unsuccessful Follow-up Telephone Call  Date of discharge and from where:  07/23/2021, [facility]   Attempts:  3rd Attempt  Reason for unsuccessful TCM follow-up call:  Left voice message  on # [phone]     Patient has appointment with Dr Wynetta Emery 08/01/2021

## 2021-07-28 ENCOUNTER — Ambulatory Visit: Payer: Self-pay | Admitting: Cardiovascular Disease

## 2021-08-01 ENCOUNTER — Other Ambulatory Visit: Payer: Self-pay

## 2021-08-01 ENCOUNTER — Ambulatory Visit: Payer: Self-pay | Attending: Internal Medicine | Admitting: Internal Medicine

## 2021-08-01 VITALS — BP 175/94 | HR 78 | Temp 98.2°F | Ht 64.0 in | Wt 226.4 lb

## 2021-08-01 DIAGNOSIS — D631 Anemia in chronic kidney disease: Secondary | ICD-10-CM

## 2021-08-01 DIAGNOSIS — I10 Essential (primary) hypertension: Secondary | ICD-10-CM

## 2021-08-01 DIAGNOSIS — I5032 Chronic diastolic (congestive) heart failure: Secondary | ICD-10-CM

## 2021-08-01 DIAGNOSIS — N184 Chronic kidney disease, stage 4 (severe): Secondary | ICD-10-CM

## 2021-08-01 DIAGNOSIS — Z09 Encounter for follow-up examination after completed treatment for conditions other than malignant neoplasm: Secondary | ICD-10-CM

## 2021-08-01 MED ORDER — FERROUS SULFATE 325 (65 FE) MG PO TABS
325.0000 mg | ORAL_TABLET | Freq: Every day | ORAL | 1 refills | Status: DC
  Filled 2021-08-01: qty 30, 30d supply, fill #0

## 2021-08-01 NOTE — Patient Instructions (Signed)
Start taking Iron supplement called Ferrous Sulfate daily.  The prescription has been sent to the pharmacy.  Please bring all medications with you when you come to your appointments.  Take your medicines with you when you go to see the cardiologist this week .

## 2021-08-01 NOTE — Progress Notes (Signed)
Patient ID: Nancy [name], female    DOB: 1961-04-23  MRN: [id number]  CC: Hospitalization Follow-up   Subjective: Nancy [name] is a 60 y.o. female who presents for hosp f/u.  Her husband is with her. 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    Pt did not bring meds with her.  Pt hospitalized 7/22-07/23/21 with CHF exacerbation.  Right heart catheterization suggested ongoing volume overload with elevated PCWP and RA.  PYP study was not suggestive of cardiac amyloid.  Suspect severe diastolic dysfunction due to longstanding and poorly controlled HTN.  Patient diuresed.  Course was complicated by worsening renal function with diuresis.  At the time of discharge creatinine was 3.99.  Nephrology was consulted during her hospital stay.  Renal ultrasound revealed no evidence of obstruction.  Today: HTN/diastolic CHF/pulmonary hypertension -she denies any increased shortness of breath.  She reports mild increase in edema since hospitalization. -Weight is up 10 pounds post hospital discharge -Denies chest pains, palpitations, PND orthopnea. -She is taking 40 mg of torsemide.  Making good urine.  Blood pressure elevated today.  She has not taken all of her medications as yet for the morning.  She is supposed to be on carvedilol, amlodipine, and torsemide. -She has both lower legs wrapped.  She states they were wrapped at the hospital to help decrease the edema and she was told not to change/remove the wraps until she is seen in CHF clinic post hospital. -She has appointment coming up in 3 days with cardiology CHF clinic which she plans to keep.  CKD  4/macroalbuminuria: Creatinine at the time of discharge was 3.99 with GFR of 12.  Urine microalbumin on last visit was 2.7 g.  Anemia of chronic disease: Iron studies done on last visit consistent with anemia of chronic disease.  She denies any blood in the stools.  No dizziness.  She is not on iron supplement.  Patient Active Problem List   Diagnosis Date Noted   CHF (congestive heart failure) (HCC) 07/07/2021   Normocytic anemia 06/30/2021   CKD (chronic kidney disease), stage IV (HCC) 06/11/2021   Acute diastolic CHF (congestive heart failure) (HCC) 06/10/2021   Abnormal nuclear stress test    Dyspnea on exertion 03/07/2021   Orthopnea 02/25/2021   History of cerebrovascular accident (CVA) with residual deficit 05/06/2020   Diabetic peripheral neuropathy (HCC) 04/26/2020   ARF (acute renal failure) (HCC) 04/20/2020   Generalized weakness 04/20/2020   Dehydration 04/20/2020   Generalized abdominal pain    Pancreatitis, acute 02/29/2020   Cerebrovascular accident (CVA) (HCC) 11/08/2019   Stenosis of right carotid artery 11/08/2019   Hyperlipidemia associated with type 2 diabetes mellitus (HCC) 11/08/2019   Cortical age-related cataract of both eyes 08/30/2019   Gait abnormality 08/30/2019   Statin declined 08/30/2019   Gastroesophageal reflux disease without esophagitis 04/18/2018   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   Uncontrolled type 2 diabetes mellitus with diabetic neuropathy, with long-term current use of insulin (HCC) 10/29/2017   History of macular degeneration 10/29/2017   Chronic cervical pain 10/29/2016   Myalgia 09/14/2016   Insomnia 09/14/2016   Tinea pedis of both feet 08/20/2016   Macular degeneration 08/17/2016   Low back pain 09/21/2014   Hypertension  09/21/2014   Diabetic gastroparesis associated with type 2 diabetes mellitus ([place]) 09/14/2014   Thyroid nodule 08/17/2014   Obesity (BMI 30-39.9) 08/17/2014   Hyperlipidemia 08/05/2014   Nausea & vomiting 08/04/2014    Type II diabetes mellitus with neurological manifestations, uncontrolled ([place]) 08/04/2014     Current Outpatient Medications on File Prior to Visit  Medication Sig Dispense Refill   acetaminophen (TYLENOL) 500 MG tablet Take 500 mg by mouth every 6 (six) hours as needed for moderate pain or headache.     amLODipine (NORVASC) 5 MG tablet Take 1 tablet (5 mg total) by mouth daily. 30 tablet 0   aspirin EC 81 MG tablet Take 1 tablet (81 mg total) by mouth daily. 100 tablet 1   atorvastatin (LIPITOR) 40 MG tablet Take 1 tablet (40 mg total) by mouth daily. 30 tablet 1   Blood Glucose Monitoring Suppl (ONETOUCH VERIO) w/Device KIT Check blood sugar three times daily. E11.40 1 kit 0   Blood Glucose Monitoring Suppl (TRUE METRIX METER) w/Device KIT Check blood sugar three times daily. E11.40 1 kit 0   carvedilol (COREG) 12.5 MG tablet Take 1 tablet (12.5 mg total) by mouth 2 (two) times daily with a meal. 60 tablet 0   DULoxetine (CYMBALTA) 20 MG capsule TAKE 1 CAPSULE (20 MG TOTAL) BY MOUTH DAILY. 30 capsule 2   glucose blood test strip Use as instructedCheck blood sugar three times daily. E11.40 100 each 12   insulin glargine (LANTUS) 100 UNIT/ML Solostar Pen Inject 55 Units into the skin at bedtime. 15 mL 3   insulin lispro (HUMALOG KWIKPEN) 100 UNIT/ML KwikPen Inject 4 units before breakfast and dinner. (Patient taking differently: Inject 4 Units into the skin every evening.) 15 mL 3   risperiDONE (RISPERDAL) 3 MG tablet TAKE 1 TABLET (3 MG TOTAL) BY MOUTH AT BEDTIME. 30 tablet 2   torsemide (DEMADEX) 20 MG tablet Take 2 tablets (40 mg total) by mouth daily. 60 tablet 0   mirtazapine (REMERON) 15 MG tablet TAKE 1 TABLET (15 MG TOTAL) BY MOUTH AT BEDTIME. (Patient not taking: Reported on 08/01/2021) 30 tablet 2   [DISCONTINUED] Insulin NPH Isophane & Regular (RELION 70/30 [place]) Inject 35 Units into the skin 2 (two) times daily.     No current facility-administered medications on file prior to visit.     Allergies  Allergen Reactions   Elemental Sulfur Hives and Other (See Comments)    PATIENT STATED THIS, MORE THAN LIKELY, SHOULD HAVE BEEN LOGGED AS "SULFA"   Hydralazine Hcl Other (See Comments)    Hair loss   Hydrocodone Itching and Other (See Comments)    Upset stomach   Metformin And Related Nausea And Vomiting and Other (See Comments)    Stomach pains, also   Other Nausea Only and Other (See Comments)    Lettuce- Does not digest this!!   Plaquenil [Hydroxychloroquine Sulfate] Hives   Shellfish-Derived Products Nausea Only and Other (See Comments)    Caused an upset stomach   Shrimp (Diagnostic) Nausea Only and Other (See Comments)    Upset stomach    Sulfa Antibiotics Hives    Social History   Socioeconomic History   Marital status: Legally Separated    Spouse name: [name]   Number of children: 1   Years of education: some colle   Highest education level: Not on file  Occupational History   Occupation: Employed FT as Landscape architect    Comment: [employer] , [place]  Tobacco Use   Smoking  status: Former    Packs/day: 0.25    Years: 0.50    Pack years: 0.13    Types: Cigarettes   Smokeless tobacco: Never   Tobacco comments:    quit yrs ago  Vaping Use   Vaping Use: Never used  Substance and Sexual Activity   Alcohol use: No   Drug use: No   Sexual activity: Yes    Birth control/protection: None  Other Topics Concern   Not on file  Social History Narrative   02/18/20 lives alone     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    Social Determinants of Health   Financial Resource Strain: High Risk   Difficulty of Paying Living Expenses: Hard  Food Insecurity: Food Insecurity Present   Worried About Charity fundraiser in the Last Year: Sometimes true   Ran Out of Food in the Last Year: Sometimes true  Transportation Needs: No Transportation Needs   Lack of Transportation (Medical): No   Lack of Transportation (Non-Medical): No   Physical Activity: Not on file  Stress: Not on file  Social Connections: Not on file  Intimate Partner Violence: Not on file    Family History  Problem Relation Age of Onset   Hypertension Mother    Heart disease Mother    Diabetes Mother    Thyroid disease Mother    Congestive Heart Failure Mother    Breast cancer Maternal Grandmother    Colon cancer Maternal Grandfather    Heart attack Sister    Heart disease Brother    Hyperlipidemia Brother    Hypertension Brother    Diabetes Father    Breast cancer Maternal Aunt     Past Surgical History:  Procedure Laterality Date   ABDOMINAL HYSTERECTOMY  2005   CATARACT EXTRACTION Left 11/2019   CESAREAN SECTION  1983    CHOLECYSTECTOMY N/A 03/05/2020   Procedure: LAPAROSCOPIC CHOLECYSTECTOMY WITH INTRAOPERATIVE CHOLANGIOGRAM;  Surgeon: Donnie Mesa, MD;  Location: [facility];  Service: General;  Laterality: N/A;   ESOPHAGOGASTRODUODENOSCOPY (EGD) WITH PROPOFOL Left 08/26/2014   Procedure: ESOPHAGOGASTRODUODENOSCOPY (EGD) WITH PROPOFOL;  Surgeon: Arta Silence, MD;  Location: [facility];  Service: Endoscopy;  Laterality: Left;   ESOPHAGOGASTRODUODENOSCOPY (EGD) WITH PROPOFOL N/A 03/03/2020   Procedure: ESOPHAGOGASTRODUODENOSCOPY (EGD) WITH PROPOFOL;  Surgeon: Jerene Bears, MD;  Location: [facility];  Service: Gastroenterology;  Laterality: N/A;   RIGHT HEART CATH N/A 07/14/2021   Procedure: RIGHT HEART CATH;  Surgeon: Larey Dresser, MD;  Location: [place] [facility];  Service: Cardiovascular;  Laterality: N/A;   RIGHT/LEFT HEART CATH AND CORONARY ANGIOGRAPHY N/A 04/27/2021   Procedure: RIGHT/LEFT HEART CATH AND CORONARY ANGIOGRAPHY;  Surgeon: [name], MD;  Location: [place] [facility];  Service: Cardiovascular;  Laterality: N/A;    ROS: Review of Systems Negative except as stated above  PHYSICAL EXAM: BP (!) 175/94 Comment: no meds this AM  Pulse 78   Temp 98.2 F (36.8 C) (Oral)   Ht $R'5\' 4"'xF$  (1.626 m)   Wt 226 lb  6.4 oz (102.7 kg)   SpO2 99%   BMI 38.86 kg/m   Physical Exam  General appearance -older African-American female who appears chronically ill.  She is in no acute distress. Mental status -patient is a little forgetful but answers most questions appropriately. Mouth -oral mucosa slightly dry. Chest -breath sounds slightly decreased at the bases but no crackles or wheezes heard. Heart - RRR Extremities - lower legs wrapped in  cling like wrap. No significant edema appreciated Skin - skin on toes dried and crack  Lab Results  Component Value Date   IRON 33 06/30/2021   TIBC 224 (L) 06/30/2021   FERRITIN 145 06/30/2021    CMP Latest Ref Rng & Units 08/01/2021 07/23/2021 07/22/2021  Glucose 65 - 99 mg/dL 157(H) 152(H) 173(H)  BUN 8 - 27 mg/dL 41(H) 81(H) 78(H)  Creatinine 0.57 - 1.00 mg/dL 2.76(H) 3.99(H) 4.07(H)  Sodium 134 - 144 mmol/L 139 136 133(L)  Potassium 3.5 - 5.2 mmol/L 4.8 4.5 4.9  Chloride 96 - 106 mmol/L 102 100 97(L)  CO2 20 - 29 mmol/L $RemoveB'21 25 28  '[id number]$ Calcium 8.7 - 10.3 mg/dL 9.3 9.0 9.0  Total Protein 6.0 - 8.5 g/dL - - -  Total Bilirubin 0.0 - 1.2 mg/dL - - -  Alkaline Phos 44 - 121 IU/L - - -  AST 0 - 40 IU/L - - -  ALT 0 - 32 IU/L - - -   Lipid Panel     Component Value Date/Time   CHOL 169 07/11/2021 0054   CHOL 193 02/11/2020 1103   TRIG 88 07/11/2021 0054   HDL 52 07/11/2021 0054   HDL 40 02/11/2020 1103   CHOLHDL 3.3 07/11/2021 0054   VLDL 18 07/11/2021 0054   LDLCALC 99 07/11/2021 0054   LDLCALC 122 (H) 02/11/2020 1103    CBC    Component Value Date/Time   WBC 7.1 07/22/2021 0020   RBC 3.09 (L) 07/22/2021 0020   HGB 8.7 (L) 07/22/2021 0020   HGB 8.8 (L) 06/30/2021 1226   HGB 12.5 07/08/2008 1551   HCT 28.0 (L) 07/22/2021 0020   HCT 28.3 (L) 06/30/2021 1226   HCT 37.3 07/08/2008 1551   PLT 220 07/22/2021 0020   PLT 304 06/30/2021 1226   MCV 90.6 07/22/2021 0020   MCV 89 06/30/2021 1226   MCV 84.7 07/08/2008 1551   MCH 28.2 07/22/2021 0020   MCHC  31.1 07/22/2021 0020   RDW 14.6 07/22/2021 0020   RDW 13.6 06/30/2021 1226   RDW 13.0 07/08/2008 1551   LYMPHSABS 1.3 07/07/2021 1530   LYMPHSABS 2.5 02/23/2021 1523   LYMPHSABS 4.6 (H) 07/08/2008 1551   MONOABS 0.7 07/07/2021 1530   MONOABS 0.6 07/08/2008 1551   EOSABS 0.2 07/07/2021 1530   EOSABS 0.5 (H) 02/23/2021 1523   BASOSABS 0.0 07/07/2021 1530   BASOSABS 0.1 02/23/2021 1523   BASOSABS 0.1 07/08/2008 1551    ASSESSMENT AND PLAN: 1. Hospital discharge follow-up   2. Diastolic CHF, chronic (HCC) Patient's weight is up 10 pounds.  She denies any increase shortness of breath at this time.  She will continue torsemide 40 mg daily.  Advised patient that if she has any further weight increase prior to seeing the cardiology in 3 days, she should take an extra dose of the torsemide.  Encouraged to limit salt in the foods.   3. Essential hypertension Not at goal.  She has not taken all of her medicines as yet for the morning.  Advised to take meds when she returns home. Advised to take her meds before going to appt with cardiologist later this wk and to also take her bottles with her  4. CKD (chronic kidney disease) stage 4, GFR 15-29 ml/min (HCC) Recheck BMP today. Levels fluctuate depending on volume status - Ambulatory referral to Nephrology - Basic Metabolic Panel  5. Anemia in chronic kidney disease (CODE) Start iron supplement Even though this is not IDA,  she needs colon cancer screening.  Will refer for c-scope once she has applied and is approved for [facility] discount card. - Ambulatory referral to Nephrology - ferrous sulfate 325 (65 FE) MG tablet; Take 1 tablet (325 mg total) by mouth daily with breakfast.  Dispense: 100 tablet; Refill: 1    Patient was given the opportunity to ask questions.  Patient verbalized understanding of the plan and was able to repeat key elements of the plan.   Orders Placed This Encounter  Procedures   Basic Metabolic Panel   Ambulatory  referral to Nephrology     Requested Prescriptions   Signed Prescriptions Disp Refills   ferrous sulfate 325 (65 FE) MG tablet 100 tablet 1    Sig: Take 1 tablet (325 mg total) by mouth daily with breakfast.    Return in about 4 weeks (around 08/29/2021) for PAP/breast exam.  [name], MD, FACP

## 2021-08-02 ENCOUNTER — Telehealth: Payer: Self-pay

## 2021-08-02 LAB — BASIC METABOLIC PANEL
BUN/Creatinine Ratio: 15 (ref 12–28)
BUN: 41 mg/dL — ABNORMAL HIGH (ref 8–27)
CO2: 21 mmol/L (ref 20–29)
Calcium: 9.3 mg/dL (ref 8.7–10.3)
Chloride: 102 mmol/L (ref 96–106)
Creatinine, Ser: 2.76 mg/dL — ABNORMAL HIGH (ref 0.57–1.00)
Glucose: 157 mg/dL — ABNORMAL HIGH (ref 65–99)
Potassium: 4.8 mmol/L (ref 3.5–5.2)
Sodium: 139 mmol/L (ref 134–144)
eGFR: 19 mL/min/{1.73_m2} — ABNORMAL LOW (ref >59)

## 2021-08-02 NOTE — Telephone Encounter (Signed)
Patient name and DOB has been verified Patient was informed of lab results. Patient had no questions.  

## 2021-08-02 NOTE — Telephone Encounter (Signed)
-----   Message from [name], MD sent at 08/02/2021 10:23 AM EDT ----- Kidney function still abnormal but improved from when she left the hospital.

## 2021-08-03 NOTE — Progress Notes (Signed)
Advanced Heart Failure Clinic Note   PCP: Nancy Pier, MD PCP-Cardiologist: Nancy Burow, MD  HF Cardiologist: Nancy [name]  HPI:   Nancy [name] 60 y.o. AAF w/ chronic diastolic heart failure, Stage IV CKD (baseline SCr ~2-3), HTN, T2DM, HLD, CVA, PVD (carotid artery duplex 3/22: occluded Rt ICA, mod Lt ICA stenosis) and tobacco abuse.   Echo 11/2019: EF 55-60%, RV normal. No LVH. No significant valvular dysfunction   She was referred to Nancy [name] for [facility] by PCP in April 2022. Complained of progressive exertional dyspnea, LEE and orthopnea. Echo 4/22: EF 60-65%, GIIDD, mild LVH, RV normal. NST w/ findings suggestive of possible anterior ischemia vs breast attenuation. R/LHC on 04/27/21 showed mild nonobstructive CAD and RHC hemodynamics c/w pulmonary HTN (see cath data below). Diuretics were increased w/ plans to refer to the [facility]. Dry wt felt to be ~228 lb.   Unfortunately, she has had 3 consecutive re-admissions for a/c dCHF since that time.   Admitted 5/24-5/27=> discharged home on Lasix 40 daily.   Admitted 6/25-6/30 =>SCr bump to 3.2 (2.9 at d/c). Discharged home on Torsemide 40 daily. Echo EF 55-60%, RV normal.   Admitted 07/07/21= > direct admit from Nancy [name] office w/ marked fluid overload, progressive wt gain, exertional dyspnea, LEE and orthopnea. Clinic BP elevated 172/85. SCr 2.6 on admit. Started on lasix gtt. AHF team consulted to assist with further management. She underwent RHC, suggests ongoing volume overload with elevated PCWP and RA.  Prominent v-waves in PCWP but think due to severe diastolic dysfunction as there is no significant MR on review of recent echo.  PYP study was not suggestive of TTR cardiac amyloidosis.  Suspect severe diastolic dysfunction due to long-standing and poorly controlled HTN. SCr and potassium continued to climb and nephrology consulted. Her diuretics were adjusted and she was able to be discharged on low dose carvedilol and torsemide  40 daily; weight 226 lbs.  Today she returns for post hospitalization HF follow up with her husband. She says she is starting to swell in her legs more since being discharged. Uses a cane to walk around the house due to right-sided weakness from prior stroke. She has dyspnea with increased physical exertion.  Denies CP, dizziness, or PND/Orthopnea. Appetite ok. No fever or chills. Weight at home 232 pounds. Taking all medications. Drinking >2L fluid/day, eats out a couple times a week, most recently at [employer].  Review of Systems: [y] = yes, [ ] = no   General: Weight gain [ ]; Weight loss [ ]; Anorexia [ ]; Fatigue [ ]; Fever [ ]; Chills [ ]; Weakness [name] ]  Cardiac: Chest pain/pressure [ ]; Resting SOB [ ]; Exertional SOB [name] ]; Orthopnea [ ]; Pedal Edema [name] ]; Palpitations [ ]; Syncope [ ]; Presyncope [ ]; Paroxysmal nocturnal dyspnea[ ]  Pulmonary: Cough [ ]; Wheezing[ ]; Hemoptysis[ ]; Sputum [ ]; Snoring [ ]  GI: Vomiting[ ]; Dysphagia[ ]; Melena[ ]; Hematochezia [ ]; Heartburn[ ]; Abdominal pain [ ]; Constipation [ ]; Diarrhea [ ]; BRBPR [ ]  GU: Hematuria[ ]; Dysuria [ ]; Nocturia[ ]  Vascular: Pain in legs with walking [ ]; Pain in feet with lying flat [ ]; Non-healing sores [ ]; Stroke [ y]; TIA [ ]; Slurred speech [y];  Neuro: Headaches[ ]; Vertigo[ ]; Seizures[ ]; Paresthesias[ ];Blurred vision [ ]; Diplopia [ ]; Vision changes [ ]  Ortho/Skin: Arthritis [ ]; Joint pain [ ]; Muscle pain [ ]; Joint swelling [ ];  Back Pain [ ]; Rash [ ]  Psych: Depression[ ]; Anxiety[ ]  Heme: Bleeding problems [ ]; Clotting disorders [ ]; Anemia [ ]  Endocrine: Diabetes [name] ]; Thyroid dysfunction[ ]  Past Medical History:  Diagnosis Date   Anemia 2006   Depression 2014   previously on amitryptiline    Diabetes mellitus with neurological manifestation ([place]) 2006   Diabetic peripheral neuropathy ([place]) 04/26/2020   Fracture of left ankle 1997    Gastroparesis 07/2016   HOH (hard of hearing) 2004     Hyperlipidemia 2006   Hypertension 2006   IBS (irritable bowel syndrome) 2002    Leukopenia 2015    Macular degeneration 11/2019   Shingles 2009    Stroke [facility]) 2020   Thyroid nodule 2004   Current Outpatient Medications  Medication Sig Dispense Refill   acetaminophen (TYLENOL) 500 MG tablet Take 500 mg by mouth every 6 (six) hours as needed for moderate pain or headache.     amLODipine (NORVASC) 5 MG tablet Take 1 tablet (5 mg total) by mouth daily. 30 tablet 0   aspirin EC 81 MG tablet Take 1 tablet (81 mg total) by mouth daily. 100 tablet 1   atorvastatin (LIPITOR) 40 MG tablet Take 1 tablet (40 mg total) by mouth daily. 30 tablet 1   Blood Glucose Monitoring Suppl (ONETOUCH VERIO) w/Device KIT Check blood sugar three times daily. E11.40 1 kit 0   Blood Glucose Monitoring Suppl (TRUE METRIX METER) w/Device KIT Check blood sugar three times daily. E11.40 1 kit 0   carvedilol (COREG) 12.5 MG tablet Take 1 tablet (12.5 mg total) by mouth 2 (two) times daily with a meal. 60 tablet 0   DULoxetine (CYMBALTA) 20 MG capsule TAKE 1 CAPSULE (20 MG TOTAL) BY MOUTH DAILY. 30 capsule 2   ferrous sulfate 325 (65 FE) MG tablet Take 1 tablet (325 mg total) by mouth daily with breakfast. 100 tablet 1   glucose blood test strip Use as instructedCheck blood sugar three times daily. E11.40 100 each 12   insulin glargine (LANTUS) 100 UNIT/ML Solostar Pen Inject 55 Units into the skin at bedtime. 15 mL 3   insulin lispro (HUMALOG KWIKPEN) 100 UNIT/ML KwikPen Inject 4 units before breakfast and dinner. (Patient taking differently: Inject 4 Units into the skin every evening.) 15 mL 3   risperiDONE (RISPERDAL) 3 MG tablet TAKE 1 TABLET (3 MG TOTAL) BY MOUTH AT BEDTIME. 30 tablet 2   torsemide (DEMADEX) 20 MG tablet Take 2 tablets (40 mg total) by mouth daily. 60 tablet 0   No current facility-administered medications for this encounter.   Allergies  Allergen Reactions   Elemental Sulfur Hives and Other (See  Comments)    PATIENT STATED THIS, MORE THAN LIKELY, SHOULD HAVE BEEN LOGGED AS "SULFA"   Hydralazine Hcl Other (See Comments)    Hair loss   Hydrocodone Itching and Other (See Comments)    Upset stomach   Metformin And Related Nausea And Vomiting and Other (See Comments)    Stomach pains, also   Other Nausea Only and Other (See Comments)    Lettuce- Does not digest this!!   Plaquenil [Hydroxychloroquine Sulfate] Hives   Shellfish-Derived Products Nausea Only and Other (See Comments)    Caused an upset stomach   Shrimp (Diagnostic) Nausea Only and Other (See Comments)    Upset stomach    Sulfa Antibiotics Hives   Social History   Socioeconomic History   Marital status: Legally Separated  Spouse name: [name]   Number of children: 1   Years of education: some colle   Highest education level: Not on file  Occupational History   Occupation: Employed FT as Landscape architect    Comment: [employer] , [place]  Tobacco Use   Smoking status: Former    Packs/day: 0.25    Years: 0.50    Pack years: 0.13    Types: Cigarettes   Smokeless tobacco: Never   Tobacco comments:    quit yrs ago  Vaping Use   Vaping Use: Never used  Substance and Sexual Activity   Alcohol use: No   Drug use: No   Sexual activity: Yes    Birth control/protection: None  Other Topics Concern   Not on file  Social History Narrative   02/18/20 lives alone     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    Social Determinants of Health   Financial Resource Strain: High Risk   Difficulty of Paying Living Expenses: Hard  Food Insecurity: Food Insecurity Present   Worried About Charity fundraiser in the Last Year: Sometimes true   Ran Out of Food in the Last Year: Sometimes true  Transportation Needs: No Transportation Needs   Lack of Transportation (Medical): No   Lack of Transportation (Non-Medical): No  Physical Activity: Not on file  Stress: Not on file  Social Connections: Not on  file  Intimate Partner Violence: Not on file   Family History  Problem Relation Age of Onset   Hypertension Mother    Heart disease Mother    Diabetes Mother    Thyroid disease Mother    Congestive Heart Failure Mother    Breast cancer Maternal Grandmother    Colon cancer Maternal Grandfather    Heart attack Sister    Heart disease Brother    Hyperlipidemia Brother    Hypertension Brother    Diabetes Father    Breast cancer Maternal Aunt    Pulse 82   Wt 106.1 kg   SpO2 99%   BMI 40.13 kg/m   Wt Readings from Last 3 Encounters:  08/04/21 106.1 kg  08/01/21 102.7 kg  07/23/21 98.1 kg   PHYSICAL EXAM: General:  NAD. No resp difficulty, arrived in [facility] HEENT: Normal Neck: Supple. No JVD. Carotids 2+ bilat; no bruits. No lymphadenopathy or thryomegaly appreciated. Cor: PMI nondisplaced. Regular rate & rhythm. No rubs, gallops or murmurs. Lungs: Clear Abdomen: Obese, nontender, nondistended. No hepatosplenomegaly. No bruits or masses. Good bowel sounds. Extremities: No cyanosis, clubbing, rash, 2+ LE edema to knees Neuro: Alert & oriented x 3, cranial nerves grossly intact. Moves all 4 extremities w/o difficulty. Affect pleasant; right-sided weakness, slight dysarthria.  ECG: SR, non-specific lateral TW abnormality (personally reviewed).  ASSESSMENT & PLAN: 1. Chronic diastolic CHF: Echo in 0/27 showed EF 55-60%, moderate LVH, normal RV, dilated IVC.  R/LHC in 5/22 showed nonobstructive CAD, pulmonary hypertension .  Multiple admissions recently with CHF, complicated by CKD stage IV.  RHC on 7/29 suggested ongoing volume overload with elevated PCWP and RA.  Prominent v-waves in PCWP but think due to severe diastolic dysfunction as there is no significant MR on review of recent echo.  PYP study was not suggestive of TTR cardiac amyloidosis.  Suspect severe diastolic dysfunction due to long-standing and poorly controlled HTN.  NYHA III, although functional class difficult due to  right hemiparesis and physical deconditioning, she is volume overloaded on exam. - Will arrange for [employer]  boots with Home Health. Will also give her prescription for compression stockings for future use. - Increase torsemide to 80 mg bid x 3 days then back to 40 mg daily. BMET & BNP today; repeat BMET in 10 days. - Discussed importance of daily weights, limiting fluid to less than 2L/day and limiting high-sodium foods. - We discussed Cardiomems further today. Unfortunately, she is currently not a candidate as she has no insurance. I spoke with HFSW, she will reach out to see if we can help with securing insurance. 2. CAD: LHC on 5/22, she has mild to moderate segmental proximal mid and distal LAD disease. Her circumflex and RCA do not have significant disease. - Denies CP. - Continue ASA + statin. 3. HLD: On atorvastatin 40. LDL 99, HDL 52 (7/22). - Will repeat lipids in 6-8 weeks. 4. HTN: BP stable. Avoid hypotension CKD. - Continue carvedilol 12.5 mg bid. - Continue amlodipine 5 mg daily. 5. CKD stage IV: Recent AKI felt to be due to hemodynamic shifts from diuresis, temperamental kidneys with significant proteinuria. Renal US with no evidence of obstruction. - She has been referred to Nephrology. BMET today. 6. Carotid disease: Occluded R ICA, moderate L ICA - Continue ASA, statin.  Follow up with APP in 2 weeks to assess volume.  Miller, FNP 08/04/21

## 2021-08-04 ENCOUNTER — Other Ambulatory Visit: Payer: Self-pay

## 2021-08-04 ENCOUNTER — Ambulatory Visit (HOSPITAL_COMMUNITY)
Admit: 2021-08-04 | Discharge: 2021-08-04 | Disposition: A | Discharge status: Home / Self Care | Payer: Medicaid Other | Attending: Family Medicine | Admitting: Family Medicine

## 2021-08-04 ENCOUNTER — Ambulatory Visit: Payer: Self-pay | Admitting: Internal Medicine

## 2021-08-04 ENCOUNTER — Encounter (HOSPITAL_COMMUNITY): Payer: Self-pay

## 2021-08-04 VITALS — BP 140/92 | HR 82 | Wt 233.8 lb

## 2021-08-04 DIAGNOSIS — Z833 Family history of diabetes mellitus: Secondary | ICD-10-CM | POA: Insufficient documentation

## 2021-08-04 DIAGNOSIS — Z885 Allergy status to narcotic agent status: Secondary | ICD-10-CM | POA: Insufficient documentation

## 2021-08-04 DIAGNOSIS — I10 Essential (primary) hypertension: Secondary | ICD-10-CM

## 2021-08-04 DIAGNOSIS — Z794 Long term (current) use of insulin: Secondary | ICD-10-CM | POA: Insufficient documentation

## 2021-08-04 DIAGNOSIS — E785 Hyperlipidemia, unspecified: Secondary | ICD-10-CM | POA: Diagnosis not present

## 2021-08-04 DIAGNOSIS — E782 Mixed hyperlipidemia: Secondary | ICD-10-CM

## 2021-08-04 DIAGNOSIS — N184 Chronic kidney disease, stage 4 (severe): Secondary | ICD-10-CM

## 2021-08-04 DIAGNOSIS — Z87891 Personal history of nicotine dependence: Secondary | ICD-10-CM | POA: Diagnosis not present

## 2021-08-04 DIAGNOSIS — E1143 Type 2 diabetes mellitus with diabetic autonomic (poly)neuropathy: Secondary | ICD-10-CM | POA: Insufficient documentation

## 2021-08-04 DIAGNOSIS — Z8249 Family history of ischemic heart disease and other diseases of the circulatory system: Secondary | ICD-10-CM | POA: Diagnosis not present

## 2021-08-04 DIAGNOSIS — I6523 Occlusion and stenosis of bilateral carotid arteries: Secondary | ICD-10-CM

## 2021-08-04 DIAGNOSIS — I2510 Atherosclerotic heart disease of native coronary artery without angina pectoris: Secondary | ICD-10-CM

## 2021-08-04 DIAGNOSIS — Z7982 Long term (current) use of aspirin: Secondary | ICD-10-CM | POA: Insufficient documentation

## 2021-08-04 DIAGNOSIS — I130 Hypertensive heart and chronic kidney disease with heart failure and stage 1 through stage 4 chronic kidney disease, or unspecified chronic kidney disease: Secondary | ICD-10-CM | POA: Insufficient documentation

## 2021-08-04 DIAGNOSIS — Z79899 Other long term (current) drug therapy: Secondary | ICD-10-CM | POA: Insufficient documentation

## 2021-08-04 DIAGNOSIS — Z882 Allergy status to sulfonamides status: Secondary | ICD-10-CM | POA: Insufficient documentation

## 2021-08-04 DIAGNOSIS — I5022 Chronic systolic (congestive) heart failure: Secondary | ICD-10-CM

## 2021-08-04 DIAGNOSIS — E1122 Type 2 diabetes mellitus with diabetic chronic kidney disease: Secondary | ICD-10-CM | POA: Insufficient documentation

## 2021-08-04 DIAGNOSIS — I272 Pulmonary hypertension, unspecified: Secondary | ICD-10-CM | POA: Insufficient documentation

## 2021-08-04 DIAGNOSIS — I639 Cerebral infarction, unspecified: Secondary | ICD-10-CM

## 2021-08-04 DIAGNOSIS — Z8349 Family history of other endocrine, nutritional and metabolic diseases: Secondary | ICD-10-CM | POA: Insufficient documentation

## 2021-08-04 DIAGNOSIS — I5032 Chronic diastolic (congestive) heart failure: Secondary | ICD-10-CM

## 2021-08-04 LAB — BASIC METABOLIC PANEL
Anion gap: 9 (ref 5–15)
BUN: 53 mg/dL — ABNORMAL HIGH (ref 8–23)
CO2: 23 mmol/L (ref 22–32)
Calcium: 8.4 mg/dL — ABNORMAL LOW (ref 8.9–10.3)
Chloride: 102 mmol/L (ref 98–111)
Creatinine, Ser: 3.37 mg/dL — ABNORMAL HIGH (ref 0.44–1.00)
GFR, Estimated: 15 mL/min — ABNORMAL LOW (ref >60)
Glucose, Bld: 214 mg/dL — ABNORMAL HIGH (ref 70–99)
Potassium: 4.4 mmol/L (ref 3.5–5.1)
Sodium: 134 mmol/L — ABNORMAL LOW (ref 135–145)

## 2021-08-04 LAB — BRAIN NATRIURETIC PEPTIDE: B Natriuretic Peptide: 292 pg/mL — ABNORMAL HIGH (ref 0.0–100.0)

## 2021-08-04 NOTE — Patient Instructions (Addendum)
EKG done today.  Labs done today. We will contact you only if your labs are abnormal.  INCREASE Torsemide to 80mg  (4 tablets) by mouth daily for  3 days THEN DECREASE back down to 40mg  (2 tablets) by mouth daily.  No other medication changes were made. Please continue all current medications as prescribed.  Someone will be in contact with you regarding home health.   Wear compression hoses until home health contacts you.You can purchase them at [facility] supply [address] [place], [place] [address] [phone]  Your physician recommends that you schedule a follow-up appointment in: 2 weeks  If you have any questions or concerns before your next appointment please send [place] a message through mychart or call our office at [phone].    TO LEAVE A MESSAGE FOR THE NURSE SELECT OPTION 2, PLEASE LEAVE A MESSAGE INCLUDING: YOUR NAME DATE OF BIRTH CALL BACK NUMBER REASON FOR CALL**this is important as we prioritize the call backs  YOU WILL RECEIVE A CALL BACK THE SAME DAY AS LONG AS YOU CALL BEFORE 4:00 PM   Do the following things EVERYDAY: Weigh yourself in the morning before breakfast. Write it down and keep it in a log. Take your medicines as prescribed Eat low salt foods--Limit salt (sodium) to 2000 mg per day.  Stay as active as you can everyday Limit all fluids for the day to less than 2 liters   At the [facility], you and your health needs are our priority. As part of our continuing mission to provide you with exceptional heart care, we have created designated Provider Care Teams. These Care Teams include your primary Cardiologist (physician) and Advanced Practice Providers (APPs- Physician Assistants and Nurse Practitioners) who all work together to provide you with the care you need, when you need it.   You may see any of the following providers on your designated Care Team at your next follow up: Dr Glori Bickers Dr Haynes Kerns, NP Lyda Jester, [place] Audry Riles, PharmD   Please be sure to bring in all your medications bottles to every appointment.

## 2021-08-10 ENCOUNTER — Telehealth: Payer: Self-pay | Admitting: Internal Medicine

## 2021-08-10 ENCOUNTER — Telehealth (HOSPITAL_COMMUNITY): Payer: Self-pay | Admitting: Cardiology

## 2021-08-10 NOTE — Telephone Encounter (Signed)
Copied from [place] [id number]. Topic: General - Other >> Aug 10, 2021  3:22 PM Leward Quan A wrote: Reason for CRM: Patient brother Laqueta Due called in to inform Dr Wynetta Emery that patient was over to see him on 08/08/21 and they noticed that she was very swollen all over her body and they are very concerned since she has CHF and last time she had to go into the hospital in order to be brought back to normal. Please advise and call Gwenlyn Perking at Ph# [phone]

## 2021-08-10 NOTE — Telephone Encounter (Addendum)
Patient called triage to report increase B LE edema Reports she is unable to lay flat at night Increased SOB with minimal exertion Reports she can feel the fluid in her chest Mild C/P- denies dizziness  Weight today 230, weight normally 216 at home  Reports compliance with fluid and sodium restrictions Pt was able to get compression stockings as ordered at last office visit and wears daily.  Reports compliance with Torsemide 40 daily Next follow up 9/9  Labs done 08/04/21 BUN 53 Cr 3.37 B N P 292  Above reviewed with Darrick Grinder, NP Ok to increase torsemide to 40 mg BID If patient becomes extremely SOB/uncomfortable/symptomatic, should report to ER for further evaluation   Patient aware  Reports she will increase medications first, if symptoms do not improve will check in at ER  Agree with above.  Amy Clegg NP-C  11:30 AM

## 2021-08-11 NOTE — Telephone Encounter (Signed)
FYI Pt has spoken to cardiology

## 2021-08-12 ENCOUNTER — Emergency Department (HOSPITAL_COMMUNITY)
Admission: EM | Admit: 2021-08-12 | Discharge: 2021-08-13 | Disposition: A | Discharge status: Home / Self Care | Payer: Medicaid Other | Attending: Emergency Medicine | Admitting: Emergency Medicine

## 2021-08-12 ENCOUNTER — Emergency Department (HOSPITAL_COMMUNITY): Payer: Medicaid Other

## 2021-08-12 DIAGNOSIS — E1140 Type 2 diabetes mellitus with diabetic neuropathy, unspecified: Secondary | ICD-10-CM | POA: Insufficient documentation

## 2021-08-12 DIAGNOSIS — I130 Hypertensive heart and chronic kidney disease with heart failure and stage 1 through stage 4 chronic kidney disease, or unspecified chronic kidney disease: Secondary | ICD-10-CM | POA: Insufficient documentation

## 2021-08-12 DIAGNOSIS — R600 Localized edema: Secondary | ICD-10-CM | POA: Insufficient documentation

## 2021-08-12 DIAGNOSIS — N184 Chronic kidney disease, stage 4 (severe): Secondary | ICD-10-CM | POA: Diagnosis not present

## 2021-08-12 DIAGNOSIS — Z7982 Long term (current) use of aspirin: Secondary | ICD-10-CM | POA: Insufficient documentation

## 2021-08-12 DIAGNOSIS — R609 Edema, unspecified: Secondary | ICD-10-CM

## 2021-08-12 DIAGNOSIS — Z79899 Other long term (current) drug therapy: Secondary | ICD-10-CM | POA: Insufficient documentation

## 2021-08-12 DIAGNOSIS — I5031 Acute diastolic (congestive) heart failure: Secondary | ICD-10-CM | POA: Insufficient documentation

## 2021-08-12 DIAGNOSIS — Z87891 Personal history of nicotine dependence: Secondary | ICD-10-CM | POA: Diagnosis not present

## 2021-08-12 LAB — BRAIN NATRIURETIC PEPTIDE: B Natriuretic Peptide: 522.3 pg/mL — ABNORMAL HIGH (ref 0.0–100.0)

## 2021-08-12 LAB — CBC WITH DIFFERENTIAL/PLATELET
Abs Immature Granulocytes: 0.03 10*3/uL (ref 0.00–0.07)
Basophils Absolute: 0 10*3/uL (ref 0.0–0.1)
Basophils Relative: 0 %
Eosinophils Absolute: 0.7 10*3/uL — ABNORMAL HIGH (ref 0.0–0.5)
Eosinophils Relative: 8 %
HCT: 29 % — ABNORMAL LOW (ref 36.0–46.0)
Hemoglobin: 9 g/dL — ABNORMAL LOW (ref 12.0–15.0)
Immature Granulocytes: 0 %
Lymphocytes Relative: 21 %
Lymphs Abs: 2 10*3/uL (ref 0.7–4.0)
MCH: 28.8 pg (ref 26.0–34.0)
MCHC: 31 g/dL (ref 30.0–36.0)
MCV: 92.9 fL (ref 80.0–100.0)
Monocytes Absolute: 0.8 10*3/uL (ref 0.1–1.0)
Monocytes Relative: 8 %
Neutro Abs: 6.1 10*3/uL (ref 1.7–7.7)
Neutrophils Relative %: 63 %
Platelets: 244 10*3/uL (ref 150–400)
RBC: 3.12 MIL/uL — ABNORMAL LOW (ref 3.87–5.11)
RDW: 15 % (ref 11.5–15.5)
WBC: 9.7 10*3/uL (ref 4.0–10.5)
nRBC: 0 % (ref 0.0–0.2)

## 2021-08-12 LAB — COMPREHENSIVE METABOLIC PANEL
ALT: 9 U/L (ref 0–44)
AST: 11 U/L — ABNORMAL LOW (ref 15–41)
Albumin: 3 g/dL — ABNORMAL LOW (ref 3.5–5.0)
Alkaline Phosphatase: 53 U/L (ref 38–126)
Anion gap: 7 (ref 5–15)
BUN: 44 mg/dL — ABNORMAL HIGH (ref 8–23)
CO2: 23 mmol/L (ref 22–32)
Calcium: 8.7 mg/dL — ABNORMAL LOW (ref 8.9–10.3)
Chloride: 105 mmol/L (ref 98–111)
Creatinine, Ser: 3.07 mg/dL — ABNORMAL HIGH (ref 0.44–1.00)
GFR, Estimated: 17 mL/min — ABNORMAL LOW (ref >60)
Glucose, Bld: 232 mg/dL — ABNORMAL HIGH (ref 70–99)
Potassium: 4.6 mmol/L (ref 3.5–5.1)
Sodium: 135 mmol/L (ref 135–145)
Total Bilirubin: 0.6 mg/dL (ref 0.3–1.2)
Total Protein: 6.8 g/dL (ref 6.5–8.1)

## 2021-08-12 LAB — TROPONIN I (HIGH SENSITIVITY): Troponin I (High Sensitivity): 12 ng/L (ref <18)

## 2021-08-12 NOTE — ED Provider Notes (Signed)
Emergency Medicine Provider Triage Evaluation Note  Nancy [name] , a 60 y.o. female  was evaluated in triage.  Pt complains of sob and CP. Sx x 4-5 days. Increase in LE edema. Compliant with home meds. Sob worse with activity. CP constant. No fever, cough, back pain, weakness. Uses cane at baseline.  Review of Systems  Positive: Sob, cp, le edema Negative: Cough, fever, emesis, back pain, weakness  Physical Exam  BP (!) 164/87 (BP Location: Right Arm)   Pulse 84   Temp 99.1 F (37.3 C) (Oral)   Resp 13   SpO2 100%  Gen:   Awake, no distress   Resp:  Normal effort , speaks in full sentences MSK:   Moves extremities without difficulty Feet:  Pitting edema to mid thigh bilaterally Other:    Medical Decision Making  Medically screening exam initiated at 6:27 PM.  Appropriate orders placed.  Nancy [name] was informed that the remainder of the evaluation will be completed by another provider, this initial triage assessment does not replace that evaluation, and the importance of remaining in the ED until their evaluation is complete.  CP, SOB   Nancy [name] A, PA-C 08/12/21 1827    Nancy Dusky, MD 08/12/21 [phone]

## 2021-08-12 NOTE — ED Triage Notes (Signed)
Pt c/o swelling BLE, abd swelling, SHOB & CP x1wk. Hx CHF, compliant w HTN/CHF meds.  5/10 achy belly pain

## 2021-08-13 LAB — TROPONIN I (HIGH SENSITIVITY): Troponin I (High Sensitivity): 14 ng/L (ref <18)

## 2021-08-13 MED ORDER — FUROSEMIDE 10 MG/ML IJ SOLN
80.0000 mg | Freq: Once | INTRAMUSCULAR | Status: AC
  Administered 2021-08-13: 80 mg via INTRAVENOUS
  Filled 2021-08-13: qty 8

## 2021-08-13 NOTE — ED Provider Notes (Signed)
[facility] EMERGENCY DEPARTMENT Provider Note   CSN: [id number] Arrival date & time: 08/12/21  1754     History Chief Complaint  Patient presents with   Leg Swelling    Nancy [name] is a 60 y.o. female.   Leg Pain Location:  Leg Leg location:  R lower leg and L lower leg Pain details:    Quality:  Aching and pressure   Severity:  Mild   Onset quality:  Gradual   Duration:  3 days   Timing:  Constant   Progression:  Worsening Chronicity:  New Dislocation: no   Relieved by:  None tried Worsened by:  Nothing Ineffective treatments:  None tried Associated symptoms: no fatigue, no fever and no neck pain       Past Medical History:  Diagnosis Date   Anemia 2006   Depression 2014   previously on amitryptiline    Diabetes mellitus with neurological manifestation ([place]) 2006   Diabetic peripheral neuropathy ([place]) 04/26/2020   Fracture of left ankle 1997    Gastroparesis 07/2016   HOH (hard of hearing) 2004    Hyperlipidemia 2006   Hypertension 2006   IBS (irritable bowel syndrome) 2002    Leukopenia 2015    Macular degeneration 11/2019   Shingles 2009    Stroke [facility]) 2020   Thyroid nodule 2004    Patient Active Problem List   Diagnosis Date Noted   CHF (congestive heart failure) ([place]) 07/07/2021   Normocytic anemia 06/30/2021   CKD (chronic kidney disease), stage IV ([place]) 82/42/3536   Acute diastolic CHF (congestive heart failure) ([place]) 06/10/2021   Abnormal nuclear stress test    Dyspnea on exertion 03/07/2021   Orthopnea 02/25/2021   History of cerebrovascular accident (CVA) with residual deficit 05/06/2020   Diabetic peripheral neuropathy ([place]) 04/26/2020   ARF (acute renal failure) ([place]) 04/20/2020   Generalized weakness 04/20/2020   Dehydration 04/20/2020   Generalized abdominal pain    Pancreatitis, acute 02/29/2020   Cerebrovascular accident (CVA) ([place]) 11/08/2019   Stenosis of right carotid artery 11/08/2019    Hyperlipidemia associated with type 2 diabetes mellitus ([place]) 11/08/2019   Cortical age-related cataract of both eyes 08/30/2019   Gait abnormality 08/30/2019   Statin declined 08/30/2019   Gastroesophageal reflux disease without esophagitis 04/18/2018   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   Uncontrolled type 2 diabetes mellitus with diabetic neuropathy, with long-term current use of insulin ([place]) 10/29/2017   History of macular degeneration 10/29/2017   Chronic cervical pain 10/29/2016   Myalgia 09/14/2016   Insomnia 09/14/2016   Tinea pedis of both feet 08/20/2016   Macular degeneration 08/17/2016   Low back pain 09/21/2014   Hypertension 09/21/2014   Diabetic gastroparesis associated with type 2 diabetes mellitus ([place]) 09/14/2014   Thyroid nodule 08/17/2014   Obesity (BMI 30-39.9) 08/17/2014   Hyperlipidemia 08/05/2014   Nausea & vomiting 08/04/2014   Type II diabetes mellitus with neurological manifestations, uncontrolled ([place]) 08/04/2014    Past Surgical History:  Procedure Laterality Date   ABDOMINAL HYSTERECTOMY  2005   CATARACT EXTRACTION Left 11/2019   CESAREAN SECTION  1983    CHOLECYSTECTOMY N/A 03/05/2020   Procedure: LAPAROSCOPIC CHOLECYSTECTOMY WITH INTRAOPERATIVE CHOLANGIOGRAM;  Surgeon: Donnie Mesa, MD;  Location: [facility] ORS;  Service: General;  Laterality: N/A;   ESOPHAGOGASTRODUODENOSCOPY (EGD) WITH PROPOFOL Left 08/26/2014   Procedure: ESOPHAGOGASTRODUODENOSCOPY (EGD) WITH PROPOFOL;  Surgeon: Arta Silence, MD;  Location: [facility] ENDOSCOPY;  Service: Endoscopy;  Laterality: Left;   ESOPHAGOGASTRODUODENOSCOPY (EGD) WITH PROPOFOL  N/A 03/03/2020   Procedure: ESOPHAGOGASTRODUODENOSCOPY (EGD) WITH PROPOFOL;  Surgeon: Jerene Bears, MD;  Location: [facility] ENDOSCOPY;  Service: Gastroenterology;  Laterality: N/A;   RIGHT HEART CATH N/A 07/14/2021   Procedure: RIGHT HEART CATH;  Surgeon: Larey Dresser, MD;  Location: [place] [facility];  Service: Cardiovascular;  Laterality: N/A;   RIGHT/LEFT  HEART CATH AND CORONARY ANGIOGRAPHY N/A 04/27/2021   Procedure: RIGHT/LEFT HEART CATH AND CORONARY ANGIOGRAPHY;  Surgeon: [name], MD;  Location: [place] [facility];  Service: Cardiovascular;  Laterality: N/A;     OB History   No obstetric history on file.     Family History  Problem Relation Age of Onset   Hypertension Mother    Heart disease Mother    Diabetes Mother    Thyroid disease Mother    Congestive Heart Failure Mother    Breast cancer Maternal Grandmother    Colon cancer Maternal Grandfather    Heart attack Sister    Heart disease Brother    Hyperlipidemia Brother    Hypertension Brother    Diabetes Father    Breast cancer Maternal Aunt     Social History   Tobacco Use   Smoking status: Former    Packs/day: 0.25    Years: 0.50    Pack years: 0.13    Types: Cigarettes   Smokeless tobacco: Never   Tobacco comments:    quit yrs ago  Vaping Use   Vaping Use: Never used  Substance Use Topics   Alcohol use: No   Drug use: No    Home Medications Prior to Admission medications   Medication Sig Start Date End Date Taking? Authorizing Provider  acetaminophen (TYLENOL) 500 MG tablet Take 500 mg by mouth every 6 (six) hours as needed for moderate pain or headache.    [provider]  amLODipine (NORVASC) 5 MG tablet Take 1 tablet (5 mg total) by mouth daily. 07/22/21   Joette Catching, PA-C  aspirin EC 81 MG tablet Take 1 tablet (81 mg total) by mouth daily. 11/05/19   [name], MD  atorvastatin (LIPITOR) 40 MG tablet Take 1 tablet (40 mg total) by mouth daily. 07/22/21   Joette Catching, PA-C  Blood Glucose Monitoring Suppl (ONETOUCH VERIO) w/Device KIT Check blood sugar three times daily. E11.40 05/06/20   [name], MD  Blood Glucose Monitoring Suppl (TRUE METRIX METER) w/Device KIT Check blood sugar three times daily. E11.40 07/11/20   [name], MD  carvedilol (COREG) 12.5 MG tablet Take 1 tablet (12.5 mg  total) by mouth 2 (two) times daily with a meal. 07/21/21   Joette Catching, PA-C  DULoxetine (CYMBALTA) 20 MG capsule TAKE 1 CAPSULE (20 MG TOTAL) BY MOUTH DAILY. 02/06/21 02/06/22  Salley Slaughter, NP  ferrous sulfate 325 (65 FE) MG tablet Take 1 tablet (325 mg total) by mouth daily with breakfast. 08/01/21   [name], MD  glucose blood test strip Use as instructedCheck blood sugar three times daily. E11.40 07/11/20   [name], MD  insulin glargine (LANTUS) 100 UNIT/ML Solostar Pen Inject 55 Units into the skin at bedtime. 06/30/21   [name], MD  insulin lispro (HUMALOG KWIKPEN) 100 UNIT/ML KwikPen Inject 4 units before breakfast and dinner. Patient taking differently: Inject 4 Units into the skin every evening. 07/05/20   Camillia Herter, NP  risperiDONE (RISPERDAL) 3 MG tablet TAKE 1 TABLET (3 MG TOTAL) BY MOUTH AT BEDTIME.  02/06/21 02/06/22  Salley Slaughter, NP  torsemide (DEMADEX) 20 MG tablet Take 2 tablets (40 mg total) by mouth daily. 07/26/21   Barrett, Evelene Croon, PA-C  Insulin NPH Isophane & Regular (RELION 70/30 March ARB) Inject 35 Units into the skin 2 (two) times daily.  08/17/14  [provider]    Allergies    Elemental sulfur, Hydralazine hcl, Hydrocodone, Metformin and related, Other, Plaquenil [hydroxychloroquine sulfate], Shellfish-derived products, Shrimp (diagnostic), and Sulfa antibiotics  Review of Systems   Review of Systems  Constitutional:  Negative for fatigue and fever.  Musculoskeletal:  Negative for neck pain.   Physical Exam Updated Vital Signs BP (!) 167/76   Pulse 77   Temp 99.1 F (37.3 C) (Oral)   Resp 16   SpO2 97%   Physical Exam Vitals and nursing note reviewed.  HENT:     Head: Normocephalic.     Mouth/Throat:     Mouth: Mucous membranes are moist.     Pharynx: Oropharynx is clear.  Eyes:     Pupils: Pupils are equal, round, and reactive to light.  Cardiovascular:     Rate and Rhythm: Normal rate.   Pulmonary:     Effort: Pulmonary effort is normal.  Abdominal:     General: Abdomen is flat.  Musculoskeletal:        General: Swelling (bilateral lower extremity) present. No tenderness. Normal range of motion.     Right lower leg: Edema present.     Left lower leg: Edema present.  Skin:    General: Skin is warm and dry.  Neurological:     General: No focal deficit present.    ED Results / Procedures / Treatments   Labs (all labs ordered are listed, but only abnormal results are displayed) Labs Reviewed  CBC WITH DIFFERENTIAL/PLATELET - Abnormal; Notable for the following components:      Result Value   RBC 3.12 (*)    Hemoglobin 9.0 (*)    HCT 29.0 (*)    Eosinophils Absolute 0.7 (*)    All other components within normal limits  COMPREHENSIVE METABOLIC PANEL - Abnormal; Notable for the following components:   Glucose, Bld 232 (*)    BUN 44 (*)    Creatinine, Ser 3.07 (*)    Calcium 8.7 (*)    Albumin 3.0 (*)    AST 11 (*)    GFR, Estimated 17 (*)    All other components within normal limits  BRAIN NATRIURETIC PEPTIDE - Abnormal; Notable for the following components:   B Natriuretic Peptide 522.3 (*)    All other components within normal limits  TROPONIN I (HIGH SENSITIVITY)  TROPONIN I (HIGH SENSITIVITY)    EKG EKG Interpretation  Date/Time:  Saturday August 12 2021 18:42:26 EDT Ventricular Rate:  83 PR Interval:  160 QRS Duration: 82 QT Interval:  372 QTC Calculation: 437 R Axis:   -25 Text Interpretation: Normal sinus rhythm Nonspecific T wave abnormality Abnormal ECG Confirmed by Merrily Pew [phone]) on 08/13/2021 2:54:30 AM  Radiology DG Chest 2 View  Result Date: 08/12/2021 CLINICAL DATA:  Chest pain.  Shortness of breath. EXAM: CHEST - 2 VIEW COMPARISON:  July 07, 2021 FINDINGS: The heart size is borderline to mildly enlarged. The hila and mediastinum are normal. No pneumothorax. No nodules or masses. Mild atelectasis in the bases. No focal  infiltrates. No overt edema. IMPRESSION: Mild atelectasis in the bases. No acute abnormalities. No overt edema on today's study. Electronically Signed   By: Shanon Brow  Jimmye Norman III M.D.   On: 08/12/2021 19:11    Procedures Procedures   Medications Ordered in ED Medications  furosemide (LASIX) injection 80 mg (80 mg Intravenous Given 08/13/21 0600)    ED Course  I have reviewed the triage vital signs and the nursing notes.  Pertinent labs & imaging results that were available during my care of the patient were reviewed by me and considered in my medical decision making (see chart for details).    MDM Rules/Calculators/A&P                         60 yo F w/ peripheral edema. Given some lasix. Improved. No distress or abnormalities. Will double her torsemide at home w/ outpatient follow up.  Final Clinical Impression(s) / ED Diagnoses Final diagnoses:  Peripheral edema    Rx / DC Orders ED Discharge Orders     None        Nikkolas Coomes, [name] Cornea, MD 08/13/21 [phone]

## 2021-08-13 NOTE — Discharge Instructions (Addendum)
Double your torsemide for the next 5 days. Follow up with your doctor who prescribes it in a week to ensure improvement in symptoms. Return here for new or worsening symptoms.

## 2021-08-25 ENCOUNTER — Other Ambulatory Visit: Payer: Self-pay

## 2021-08-25 ENCOUNTER — Ambulatory Visit (HOSPITAL_COMMUNITY)
Admission: RE | Admit: 2021-08-25 | Discharge: 2021-08-25 | Disposition: A | Discharge status: Home / Self Care | Payer: MEDICAID | Source: Ambulatory Visit | Attending: Family Medicine | Admitting: Family Medicine

## 2021-08-30 ENCOUNTER — Encounter: Payer: Self-pay | Admitting: Internal Medicine

## 2021-08-30 NOTE — Progress Notes (Signed)
Note received from patient's neurologist Dr. Hollie Salk.  Patient seen 08/16/2021. She notes that patient has had a relatively rapid decline in GFR in the past year with repeated insults of AKI in the setting of volume overload.  Suspect cardiorenal syndrome especially in the setting of recent right heart catheterization results. Not on ACE inhibitor or ARB or SGLT2i due to decline in eGFR. Spoke with patient about dialysis as she foresees that she will have a quick decline to end-stage renal disease.

## 2021-09-04 ENCOUNTER — Emergency Department (HOSPITAL_COMMUNITY): Payer: Medicaid Other

## 2021-09-04 ENCOUNTER — Inpatient Hospital Stay (HOSPITAL_COMMUNITY)
Admission: EM | Admit: 2021-09-04 | Discharge: 2021-09-21 | DRG: 637 | Disposition: A | Discharge status: Home Health | Payer: Medicaid Other | Attending: Internal Medicine | Admitting: Internal Medicine

## 2021-09-04 ENCOUNTER — Encounter (HOSPITAL_COMMUNITY): Payer: Self-pay | Admitting: Emergency Medicine

## 2021-09-04 ENCOUNTER — Other Ambulatory Visit: Payer: Self-pay

## 2021-09-04 DIAGNOSIS — E669 Obesity, unspecified: Secondary | ICD-10-CM | POA: Diagnosis present

## 2021-09-04 DIAGNOSIS — I130 Hypertensive heart and chronic kidney disease with heart failure and stage 1 through stage 4 chronic kidney disease, or unspecified chronic kidney disease: Secondary | ICD-10-CM | POA: Diagnosis present

## 2021-09-04 DIAGNOSIS — E11621 Type 2 diabetes mellitus with foot ulcer: Principal | ICD-10-CM | POA: Diagnosis present

## 2021-09-04 DIAGNOSIS — Z79899 Other long term (current) drug therapy: Secondary | ICD-10-CM

## 2021-09-04 DIAGNOSIS — Z91018 Allergy to other foods: Secondary | ICD-10-CM

## 2021-09-04 DIAGNOSIS — Z885 Allergy status to narcotic agent status: Secondary | ICD-10-CM

## 2021-09-04 DIAGNOSIS — N179 Acute kidney failure, unspecified: Secondary | ICD-10-CM

## 2021-09-04 DIAGNOSIS — L97529 Non-pressure chronic ulcer of other part of left foot with unspecified severity: Secondary | ICD-10-CM | POA: Diagnosis present

## 2021-09-04 DIAGNOSIS — H02402 Unspecified ptosis of left eyelid: Secondary | ICD-10-CM | POA: Diagnosis present

## 2021-09-04 DIAGNOSIS — Z6839 Body mass index (BMI) 39.0-39.9, adult: Secondary | ICD-10-CM

## 2021-09-04 DIAGNOSIS — Z7982 Long term (current) use of aspirin: Secondary | ICD-10-CM

## 2021-09-04 DIAGNOSIS — Z91013 Allergy to seafood: Secondary | ICD-10-CM

## 2021-09-04 DIAGNOSIS — L89620 Pressure ulcer of left heel, unstageable: Secondary | ICD-10-CM | POA: Diagnosis present

## 2021-09-04 DIAGNOSIS — D631 Anemia in chronic kidney disease: Secondary | ICD-10-CM | POA: Diagnosis present

## 2021-09-04 DIAGNOSIS — Z90710 Acquired absence of both cervix and uterus: Secondary | ICD-10-CM

## 2021-09-04 DIAGNOSIS — Z833 Family history of diabetes mellitus: Secondary | ICD-10-CM

## 2021-09-04 DIAGNOSIS — L89152 Pressure ulcer of sacral region, stage 2: Secondary | ICD-10-CM | POA: Diagnosis present

## 2021-09-04 DIAGNOSIS — Z7189 Other specified counseling: Secondary | ICD-10-CM

## 2021-09-04 DIAGNOSIS — Z87891 Personal history of nicotine dependence: Secondary | ICD-10-CM

## 2021-09-04 DIAGNOSIS — H9190 Unspecified hearing loss, unspecified ear: Secondary | ICD-10-CM | POA: Diagnosis present

## 2021-09-04 DIAGNOSIS — L97509 Non-pressure chronic ulcer of other part of unspecified foot with unspecified severity: Secondary | ICD-10-CM | POA: Diagnosis present

## 2021-09-04 DIAGNOSIS — E1122 Type 2 diabetes mellitus with diabetic chronic kidney disease: Secondary | ICD-10-CM | POA: Diagnosis present

## 2021-09-04 DIAGNOSIS — H3530 Unspecified macular degeneration: Secondary | ICD-10-CM | POA: Diagnosis present

## 2021-09-04 DIAGNOSIS — Z20822 Contact with and (suspected) exposure to covid-19: Secondary | ICD-10-CM | POA: Diagnosis present

## 2021-09-04 DIAGNOSIS — Z8249 Family history of ischemic heart disease and other diseases of the circulatory system: Secondary | ICD-10-CM

## 2021-09-04 DIAGNOSIS — Z882 Allergy status to sulfonamides status: Secondary | ICD-10-CM

## 2021-09-04 DIAGNOSIS — L97519 Non-pressure chronic ulcer of other part of right foot with unspecified severity: Secondary | ICD-10-CM | POA: Diagnosis present

## 2021-09-04 DIAGNOSIS — R002 Palpitations: Secondary | ICD-10-CM | POA: Diagnosis not present

## 2021-09-04 DIAGNOSIS — L97429 Non-pressure chronic ulcer of left heel and midfoot with unspecified severity: Secondary | ICD-10-CM | POA: Diagnosis present

## 2021-09-04 DIAGNOSIS — R34 Anuria and oliguria: Secondary | ICD-10-CM | POA: Diagnosis not present

## 2021-09-04 DIAGNOSIS — L89610 Pressure ulcer of right heel, unstageable: Secondary | ICD-10-CM | POA: Diagnosis present

## 2021-09-04 DIAGNOSIS — E1152 Type 2 diabetes mellitus with diabetic peripheral angiopathy with gangrene: Secondary | ICD-10-CM | POA: Diagnosis present

## 2021-09-04 DIAGNOSIS — Z66 Do not resuscitate: Secondary | ICD-10-CM | POA: Diagnosis not present

## 2021-09-04 DIAGNOSIS — L03116 Cellulitis of left lower limb: Secondary | ICD-10-CM

## 2021-09-04 DIAGNOSIS — E1142 Type 2 diabetes mellitus with diabetic polyneuropathy: Secondary | ICD-10-CM | POA: Diagnosis present

## 2021-09-04 DIAGNOSIS — Z635 Disruption of family by separation and divorce: Secondary | ICD-10-CM

## 2021-09-04 DIAGNOSIS — I5033 Acute on chronic diastolic (congestive) heart failure: Secondary | ICD-10-CM | POA: Diagnosis present

## 2021-09-04 DIAGNOSIS — Z515 Encounter for palliative care: Secondary | ICD-10-CM

## 2021-09-04 DIAGNOSIS — Z83438 Family history of other disorder of lipoprotein metabolism and other lipidemia: Secondary | ICD-10-CM

## 2021-09-04 DIAGNOSIS — K589 Irritable bowel syndrome without diarrhea: Secondary | ICD-10-CM | POA: Diagnosis present

## 2021-09-04 DIAGNOSIS — Z8349 Family history of other endocrine, nutritional and metabolic diseases: Secondary | ICD-10-CM

## 2021-09-04 DIAGNOSIS — E1143 Type 2 diabetes mellitus with diabetic autonomic (poly)neuropathy: Secondary | ICD-10-CM | POA: Diagnosis present

## 2021-09-04 DIAGNOSIS — Z794 Long term (current) use of insulin: Secondary | ICD-10-CM

## 2021-09-04 DIAGNOSIS — R442 Other hallucinations: Secondary | ICD-10-CM | POA: Diagnosis present

## 2021-09-04 DIAGNOSIS — F03918 Unspecified dementia, unspecified severity, with other behavioral disturbance: Secondary | ICD-10-CM | POA: Diagnosis present

## 2021-09-04 DIAGNOSIS — N184 Chronic kidney disease, stage 4 (severe): Secondary | ICD-10-CM | POA: Diagnosis present

## 2021-09-04 DIAGNOSIS — Z888 Allergy status to other drugs, medicaments and biological substances status: Secondary | ICD-10-CM

## 2021-09-04 DIAGNOSIS — I69354 Hemiplegia and hemiparesis following cerebral infarction affecting left non-dominant side: Secondary | ICD-10-CM

## 2021-09-04 DIAGNOSIS — E785 Hyperlipidemia, unspecified: Secondary | ICD-10-CM | POA: Diagnosis present

## 2021-09-04 DIAGNOSIS — E1165 Type 2 diabetes mellitus with hyperglycemia: Secondary | ICD-10-CM | POA: Diagnosis present

## 2021-09-04 DIAGNOSIS — F32A Depression, unspecified: Secondary | ICD-10-CM | POA: Diagnosis present

## 2021-09-04 DIAGNOSIS — E1149 Type 2 diabetes mellitus with other diabetic neurological complication: Secondary | ICD-10-CM | POA: Diagnosis present

## 2021-09-04 DIAGNOSIS — Z9049 Acquired absence of other specified parts of digestive tract: Secondary | ICD-10-CM

## 2021-09-04 DIAGNOSIS — I96 Gangrene, not elsewhere classified: Secondary | ICD-10-CM

## 2021-09-04 LAB — SARS CORONAVIRUS 2 (TAT 6-24 HRS): SARS Coronavirus 2: NEGATIVE

## 2021-09-04 LAB — BASIC METABOLIC PANEL
Anion gap: 11 (ref 5–15)
BUN: 39 mg/dL — ABNORMAL HIGH (ref 8–23)
CO2: 22 mmol/L (ref 22–32)
Calcium: 8.7 mg/dL — ABNORMAL LOW (ref 8.9–10.3)
Chloride: 99 mmol/L (ref 98–111)
Creatinine, Ser: 3.71 mg/dL — ABNORMAL HIGH (ref 0.44–1.00)
GFR, Estimated: 13 mL/min — ABNORMAL LOW (ref >60)
Glucose, Bld: 177 mg/dL — ABNORMAL HIGH (ref 70–99)
Potassium: 4.2 mmol/L (ref 3.5–5.1)
Sodium: 132 mmol/L — ABNORMAL LOW (ref 135–145)

## 2021-09-04 LAB — CBC WITH DIFFERENTIAL/PLATELET
Abs Immature Granulocytes: 0.1 10*3/uL — ABNORMAL HIGH (ref 0.00–0.07)
Basophils Absolute: 0.1 10*3/uL (ref 0.0–0.1)
Basophils Relative: 0 %
Eosinophils Absolute: 0.2 10*3/uL (ref 0.0–0.5)
Eosinophils Relative: 1 %
HCT: 27.1 % — ABNORMAL LOW (ref 36.0–46.0)
Hemoglobin: 8.2 g/dL — ABNORMAL LOW (ref 12.0–15.0)
Immature Granulocytes: 1 %
Lymphocytes Relative: 8 %
Lymphs Abs: 1.4 10*3/uL (ref 0.7–4.0)
MCH: 27.8 pg (ref 26.0–34.0)
MCHC: 30.3 g/dL (ref 30.0–36.0)
MCV: 91.9 fL (ref 80.0–100.0)
Monocytes Absolute: 1.6 10*3/uL — ABNORMAL HIGH (ref 0.1–1.0)
Monocytes Relative: 10 %
Neutro Abs: 13.8 10*3/uL — ABNORMAL HIGH (ref 1.7–7.7)
Neutrophils Relative %: 80 %
Platelets: 317 10*3/uL (ref 150–400)
RBC: 2.95 MIL/uL — ABNORMAL LOW (ref 3.87–5.11)
RDW: 14.5 % (ref 11.5–15.5)
WBC: 17.1 10*3/uL — ABNORMAL HIGH (ref 4.0–10.5)
nRBC: 0 % (ref 0.0–0.2)

## 2021-09-04 LAB — LACTIC ACID, PLASMA
Lactic Acid, Venous: 1.1 mmol/L (ref 0.5–1.9)
Lactic Acid, Venous: 1.1 mmol/L (ref 0.5–1.9)

## 2021-09-04 LAB — HEMOGLOBIN A1C
Hgb A1c MFr Bld: 6.8 % — ABNORMAL HIGH (ref 4.8–5.6)
Mean Plasma Glucose: 148.46 mg/dL

## 2021-09-04 LAB — MAGNESIUM: Magnesium: 2.1 mg/dL (ref 1.7–2.4)

## 2021-09-04 LAB — PHOSPHORUS: Phosphorus: 3.9 mg/dL (ref 2.5–4.6)

## 2021-09-04 MED ORDER — ACETAMINOPHEN 500 MG PO TABS
1000.0000 mg | ORAL_TABLET | Freq: Once | ORAL | Status: AC
  Administered 2021-09-04: 1000 mg via ORAL
  Filled 2021-09-04: qty 2

## 2021-09-04 MED ORDER — PIPERACILLIN-TAZOBACTAM 3.375 G IVPB
3.3750 g | Freq: Two times a day (BID) | INTRAVENOUS | Status: DC
  Administered 2021-09-05: 3.375 g via INTRAVENOUS
  Filled 2021-09-04 (×2): qty 50

## 2021-09-04 MED ORDER — HEPARIN SODIUM (PORCINE) 5000 UNIT/ML IJ SOLN
5000.0000 [IU] | Freq: Three times a day (TID) | INTRAMUSCULAR | Status: DC
  Administered 2021-09-04 – 2021-09-21 (×51): 5000 [IU] via SUBCUTANEOUS
  Filled 2021-09-04 (×48): qty 1

## 2021-09-04 MED ORDER — ACETAMINOPHEN 325 MG PO TABS
650.0000 mg | ORAL_TABLET | Freq: Four times a day (QID) | ORAL | Status: DC | PRN
  Administered 2021-09-05 – 2021-09-19 (×8): 650 mg via ORAL
  Filled 2021-09-04 (×10): qty 2

## 2021-09-04 MED ORDER — PIPERACILLIN-TAZOBACTAM IN DEX 2-0.25 GM/50ML IV SOLN
2.2500 g | Freq: Three times a day (TID) | INTRAVENOUS | Status: DC
  Filled 2021-09-04 (×2): qty 50

## 2021-09-04 MED ORDER — ATORVASTATIN CALCIUM 40 MG PO TABS
40.0000 mg | ORAL_TABLET | Freq: Every day | ORAL | Status: DC
  Administered 2021-09-04 – 2021-09-21 (×18): 40 mg via ORAL
  Filled 2021-09-04 (×18): qty 1

## 2021-09-04 MED ORDER — CARVEDILOL 12.5 MG PO TABS
12.5000 mg | ORAL_TABLET | Freq: Two times a day (BID) | ORAL | Status: DC
  Administered 2021-09-04 – 2021-09-21 (×34): 12.5 mg via ORAL
  Filled 2021-09-04 (×11): qty 1
  Filled 2021-09-04: qty 4
  Filled 2021-09-04 (×22): qty 1

## 2021-09-04 MED ORDER — ASPIRIN 81 MG PO CHEW
81.0000 mg | CHEWABLE_TABLET | Freq: Every day | ORAL | Status: DC
  Administered 2021-09-05 – 2021-09-21 (×17): 81 mg via ORAL
  Filled 2021-09-04 (×17): qty 1

## 2021-09-04 MED ORDER — AMLODIPINE BESYLATE 5 MG PO TABS
5.0000 mg | ORAL_TABLET | Freq: Every day | ORAL | Status: DC
  Administered 2021-09-05 – 2021-09-18 (×14): 5 mg via ORAL
  Filled 2021-09-04 (×14): qty 1

## 2021-09-04 MED ORDER — PIPERACILLIN-TAZOBACTAM 3.375 G IVPB 30 MIN
3.3750 g | Freq: Once | INTRAVENOUS | Status: AC
  Administered 2021-09-04: 3.375 g via INTRAVENOUS
  Filled 2021-09-04: qty 50

## 2021-09-04 MED ORDER — FUROSEMIDE 10 MG/ML IJ SOLN
40.0000 mg | Freq: Once | INTRAMUSCULAR | Status: AC
  Administered 2021-09-04: 40 mg via INTRAVENOUS
  Filled 2021-09-04: qty 4

## 2021-09-04 MED ORDER — SENNOSIDES-DOCUSATE SODIUM 8.6-50 MG PO TABS
1.0000 | ORAL_TABLET | Freq: Every day | ORAL | Status: DC
  Administered 2021-09-04 – 2021-09-17 (×9): 1 via ORAL
  Filled 2021-09-04 (×15): qty 1

## 2021-09-04 MED ORDER — PIPERACILLIN-TAZOBACTAM 3.375 G IVPB: 3.3750 g | Freq: Two times a day (BID) | INTRAVENOUS | Status: DC

## 2021-09-04 MED ORDER — PIPERACILLIN-TAZOBACTAM 3.375 G IVPB: 3.3750 g | Freq: Three times a day (TID) | INTRAVENOUS | Status: DC

## 2021-09-04 MED ORDER — FENTANYL CITRATE PF 50 MCG/ML IJ SOSY
50.0000 ug | PREFILLED_SYRINGE | Freq: Once | INTRAMUSCULAR | Status: AC
  Administered 2021-09-04: 50 ug via INTRAVENOUS
  Filled 2021-09-04: qty 1

## 2021-09-04 MED ORDER — VANCOMYCIN HCL 2000 MG/400ML IV SOLN
2000.0000 mg | Freq: Once | INTRAVENOUS | Status: AC
  Administered 2021-09-04: 2000 mg via INTRAVENOUS
  Filled 2021-09-04: qty 400

## 2021-09-04 MED ORDER — OXYCODONE HCL 5 MG PO TABS
5.0000 mg | ORAL_TABLET | Freq: Four times a day (QID) | ORAL | Status: DC | PRN
  Administered 2021-09-04 – 2021-09-19 (×8): 5 mg via ORAL
  Filled 2021-09-04 (×8): qty 1

## 2021-09-04 MED ORDER — VANCOMYCIN VARIABLE DOSE PER UNSTABLE RENAL FUNCTION (PHARMACIST DOSING): Status: DC

## 2021-09-04 NOTE — Hospital Course (Addendum)
Nancy [name] is a 60 year old female with a past medical history of HFpEF with a pertinent family history of CHF in both parents, CKD Stage IV, TIA, and Type II DM who presented with left foot pain and was admitted for a left heel ulcer secondary to diabetic neuropathy.   #Gangrenous Diabetic Foot Ulcer, heel of left foot #Diabetic Foot Ulcer, lateral L foot #Left and Right Dorsal Foot Skin Lacerations #PAD Patient has diabetic foot ulcers in the setting of diabetic neuropathy and is uncertain how long they have been present.  Difficulty with ambulation due to pain. MRI without evidence of osteomyelitis.  Patient presented to the ED on 09/19, and was given broad-spec abx for gangrenous ulcer and concern for OM. ABIs completed, and the patient has moderate bilateral LE arterial disease.  Treated with IV antibiotics.  Vascular and Ortho recommend transtibial amputation given the low likelihood of her wound to heal.  Patient declining at this time and would like to try and salvage her left lower extremity with oral antibiotics and wound care.  It was recommended that patient go to a SNF however she is declining SNF placement at this time.  Palliative care met with her and she did not want to discuss her thoughts and feelings about all that has occurred, but she gave them permission to speak with her husband. Her husband supported her decisions regarding amputation but expressed his inability to care for her at home.  He believes SNF placement would be better for her, especially considering he works from 3-11pm, but they do not have insurance (medicaid pending). Home health set up and patient to be discharged home.  Patient will discharge with a 5 day course of oral doxycycline and Augmentin. She also discharged with Prevalon heel boots to help offload pressure.  She also be set up with orthopedic surgery for wound care and continued discussions regarding possible amputation. Will continue her home  aspirin 81 mg daily, Lipitor 40 mg daily.   #Acute on chronic HFpEF exacerbation Patient presented with lower extremity edema concerning for acute on chronic HFpEF exacerbation, which has improved with lasix. Patient had echo 06/22 with EF of 55 to 60%.  She was started back on her home torsemide dose which she will continue on discharge.  She will also continue her home Coreg.  #AKI on CKD Stage V #Oliguria #Anemia of CKD Patient has progressive renal insufficiency likely secondary to diabetic nephropathy. Patient had worsening kidney function this admission with creatinine 5.2 on 9/21.  UA without concerns for infection or casts.  Urine sodium <10.  AKI possibly due to cardiorenal type I, though BUN/CR more consistent with intrinsic kidney damage.  Renal ultrasound normal.  PTH elevated 113.  Patient continues to have oliguria. -Nephrology consulted this hospital admission and stated that patient would likely need AV fistula and hemodialysis. This was discussed with patient and she is currently not interested in hemodialysis.  Nephrology concerned about her dramatic decline in renal function over the last 12 months and progression from CKD Stage IV to Stage V, but there is no absolute indication for urgent or emergent need for hemodialysis at this time.  Nephrology believes her kidney function is stabilizing at CKD Stage V. Patient received a one-time dose of darbepoetin this admission.  She will discharge home with a phosphate binder and will continue her home torsemide for hypervolemia.  She needs to schedule an appointment in outpatient setting with nephrology.  #Poorly Controlled Type II DM #Diabetic neuropathy Patient  monitors her blood sugar once daily before meals, and her home sugars have been between 178-197. Most recent A1c is 6.8. Her husband says she has been eating well in the hospital.  Blood sugars appropriate this admission. She was continued on her home lantus and humalog + SSI And  her home duloxetine 20 mg daily for her neuropathy.  She was discharged on these medications   #History of tactile hallucinations Patient was continued on risperidone 3 mg nightly while inpatient.   #HTN Patient's blood pressure medications, Amlodipine 5 mg daily and carvedilol 12.5 mg twice daily, were continued. SBPs 160s and increased amlodipine 5 to 10. Otherwise, whe was mostly normotensive while inpatient. She will discharge on her home BP medications.

## 2021-09-04 NOTE — ED Triage Notes (Signed)
Pt here sent here from her foot md for a necrotic left heel , pt is a diabetic , wound started 2 -3 weeks ago

## 2021-09-04 NOTE — Progress Notes (Addendum)
Pharmacy Antibiotic Note  Nancy [name] is a 60 y.o. female admitted on 09/04/2021 with cellulitis.  Pharmacy has been consulted for vancomycin and zosyn dosing. Patient was sent here by PCP for a necrotic left heel that started about 2-3 weeks ago.  Patient's serum creatinine is 3.71 which is above baseline (~2-3). No intervention documented per nephrology at this time.  Plan: Zosyn 3.375g every 12 hours (CrCl 18.11mL/min) Vancomycin 2000 mg once, subsequent dosing as indicated per random vancomycin level until renal function stable and/or improved, at which time scheduled dosing can be considered Follow-up cultures and de-escalate antibiotics as appropriate. F/u nephrology recommendations F/u cultures De-escalate when able    Temp (24hrs), Avg:101 F (38.3 C), Min:101 F (38.3 C), Max:101 F (38.3 C)  Recent Labs  Lab 09/04/21 1145  WBC 17.1*  CREATININE 3.71*  LATICACIDVEN 1.1    CrCl cannot be calculated (Unknown ideal weight.).    Allergies  Allergen Reactions   Elemental Sulfur Hives and Other (See Comments)    PATIENT STATED THIS, MORE THAN LIKELY, SHOULD HAVE BEEN LOGGED AS "SULFA"   Hydralazine Hcl Other (See Comments)    Hair loss   Hydrocodone Itching and Other (See Comments)    Upset stomach   Metformin And Related Nausea And Vomiting and Other (See Comments)    Stomach pains, also   Other Nausea Only and Other (See Comments)    Lettuce- Does not digest this!!   Plaquenil [Hydroxychloroquine Sulfate] Hives   Shellfish-Derived Products Nausea Only and Other (See Comments)    Caused an upset stomach   Shrimp (Diagnostic) Nausea Only and Other (See Comments)    Upset stomach    Sulfa Antibiotics Hives    Antimicrobials this admission: zosyn 9/19 >>  vancomycin 9/19 >>  Microbiology results: Pending  Thank you for allowing pharmacy to be a part of this patient's care.  Laurey Arrow, PharmD PGY1 Pharmacy Resident 09/04/2021  7:59 PM  Please check  [email or web address] for unit-specific pharmacy phone numbers.

## 2021-09-04 NOTE — ED Notes (Signed)
Pt asked for bedpan, continent of 1 soft brown BM. Re-applied new purewick.

## 2021-09-04 NOTE — ED Notes (Signed)
Pt in MRI.

## 2021-09-04 NOTE — ED Provider Notes (Signed)
[facility] EMERGENCY DEPARTMENT Provider Note   CSN: [id number] Arrival date & time: 09/04/21  1131     History No chief complaint on file.   Nancy [name] is a 60 y.o. female with pertinent PMHx  insulin dependent T2DM with neuropathy (>53yr diagnosed; A1c-6.8%); gastroparesis; HLD; HTN; and CHF presenting with c/o left foot infection.  Patient stated that about 3 weeks ago she was admitted to the hospital for CHF exacerbation. She reports using "blue hospital socks" that resulted in breaking of the skin at the dorsal surface her foot. Since then she has been having foot pain when she stands and worsening foot wound.   [facility] specialist saw this patient this morning and instructed them to come to the ED to address possible necrotic foot wound located at the heel of the left foot. This is a preexisting wound, unknown duration. Foot Specialist has been changing the bandage for this wound regularly, according to the patient.             Past Medical History:  Diagnosis Date   Anemia 2006   Depression 2014   previously on amitryptiline    Diabetes mellitus with neurological manifestation ([facility] 2006   Diabetic peripheral neuropathy ([facility] 04/26/2020   Fracture of left ankle 1997    Gastroparesis 07/2016   HOH (hard of hearing) 2004    Hyperlipidemia 2006   Hypertension 2006   IBS (irritable bowel syndrome) 2002    Leukopenia 2015    Macular degeneration 11/2019   Shingles 2009    Stroke ([facility] 2020   Thyroid nodule 2004    Patient Active Problem List   Diagnosis Date Noted   CHF (congestive heart failure) ([facility] 07/07/2021   Normocytic anemia 06/30/2021   CKD (chronic kidney disease), stage IV ([place] 040/09/2724  Acute diastolic CHF (congestive heart failure) ([place] 06/10/2021   Abnormal nuclear stress test    Dyspnea on exertion 03/07/2021   Orthopnea 02/25/2021   History of cerebrovascular accident (CVA) with residual deficit  05/06/2020   Diabetic peripheral neuropathy ([facility] 04/26/2020   ARF (acute renal failure) ([facility] [place] 04/20/2020   Generalized weakness 04/20/2020   Dehydration 04/20/2020   Generalized abdominal pain    Pancreatitis, acute 02/29/2020   Cerebrovascular accident (CVA) ([facility] 11/08/2019   Stenosis of right carotid artery 11/08/2019   Hyperlipidemia associated with type 2 diabetes mellitus ([facility] 11/08/2019   Cortical age-related cataract of both eyes 08/30/2019   Gait abnormality 08/30/2019   Statin declined 08/30/2019   Gastroesophageal reflux disease without esophagitis 04/18/2018   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   Uncontrolled type 2 diabetes mellitus with diabetic neuropathy, with long-term current use of insulin ([place] 10/29/2017   History of macular degeneration 10/29/2017   Chronic cervical pain 10/29/2016   Myalgia 09/14/2016   Insomnia 09/14/2016   Tinea pedis of both feet 08/20/2016   Macular degeneration 08/17/2016   Low back pain 09/21/2014   Hypertension 09/21/2014   Diabetic gastroparesis associated with type 2 diabetes mellitus ([place] 09/14/2014   Thyroid nodule 08/17/2014   Obesity (BMI 30-39.9) 08/17/2014   Hyperlipidemia 08/05/2014   Nausea & vomiting 08/04/2014   Type II diabetes mellitus with neurological manifestations, uncontrolled ([facility] 08/04/2014    Past Surgical History:  Procedure Laterality Date   ABDOMINAL HYSTERECTOMY  2005   CATARACT EXTRACTION Left 11/2019   CESAREAN SECTION  1983    CHOLECYSTECTOMY N/A 03/05/2020   Procedure: LAPAROSCOPIC CHOLECYSTECTOMY WITH INTRAOPERATIVE CHOLANGIOGRAM;  Surgeon: TDonnie Mesa MD;  Location: [facility];  Service: General;  Laterality: N/A;   ESOPHAGOGASTRODUODENOSCOPY (EGD) WITH PROPOFOL Left 08/26/2014   Procedure: ESOPHAGOGASTRODUODENOSCOPY (EGD) WITH PROPOFOL;  Surgeon: Arta Silence, MD;  Location: [facility];  Service: Endoscopy;  Laterality: Left;   ESOPHAGOGASTRODUODENOSCOPY (EGD) WITH PROPOFOL N/A 03/03/2020   Procedure:  ESOPHAGOGASTRODUODENOSCOPY (EGD) WITH PROPOFOL;  Surgeon: Jerene Bears, MD;  Location: [facility];  Service: Gastroenterology;  Laterality: N/A;   RIGHT HEART CATH N/A 07/14/2021   Procedure: RIGHT HEART CATH;  Surgeon: Larey Dresser, MD;  Location: [place] [facility];  Service: Cardiovascular;  Laterality: N/A;   RIGHT/LEFT HEART CATH AND CORONARY ANGIOGRAPHY N/A 04/27/2021   Procedure: RIGHT/LEFT HEART CATH AND CORONARY ANGIOGRAPHY;  Surgeon: [name], MD;  Location: [place] [facility];  Service: Cardiovascular;  Laterality: N/A;     OB History   No obstetric history on file.     Family History  Problem Relation Age of Onset   Hypertension Mother    Heart disease Mother    Diabetes Mother    Thyroid disease Mother    Congestive Heart Failure Mother    Breast cancer Maternal Grandmother    Colon cancer Maternal Grandfather    Heart attack Sister    Heart disease Brother    Hyperlipidemia Brother    Hypertension Brother    Diabetes Father    Breast cancer Maternal Aunt     Social History   Tobacco Use   Smoking status: Former    Packs/day: 0.25    Years: 0.50    Pack years: 0.13    Types: Cigarettes   Smokeless tobacco: Never   Tobacco comments:    quit yrs ago  Vaping Use   Vaping Use: Never used  Substance Use Topics   Alcohol use: No   Drug use: No    Home Medications Prior to Admission medications   Medication Sig Start Date End Date Taking? Authorizing Provider  acetaminophen (TYLENOL) 500 MG tablet Take 500 mg by mouth every 6 (six) hours as needed for moderate pain or headache.    [provider]  amLODipine (NORVASC) 5 MG tablet Take 1 tablet (5 mg total) by mouth daily. 07/22/21   Joette Catching, PA-C  aspirin EC 81 MG tablet Take 1 tablet (81 mg total) by mouth daily. 11/05/19   [name], MD  atorvastatin (LIPITOR) 40 MG tablet Take 1 tablet (40 mg total) by mouth daily. 07/22/21   Joette Catching, PA-C  Blood  Glucose Monitoring Suppl (ONETOUCH VERIO) w/Device KIT Check blood sugar three times daily. E11.40 05/06/20   [name], MD  Blood Glucose Monitoring Suppl (TRUE METRIX METER) w/Device KIT Check blood sugar three times daily. E11.40 07/11/20   [name], MD  carvedilol (COREG) 12.5 MG tablet Take 1 tablet (12.5 mg total) by mouth 2 (two) times daily with a meal. 07/21/21   Joette Catching, PA-C  DULoxetine (CYMBALTA) 20 MG capsule TAKE 1 CAPSULE (20 MG TOTAL) BY MOUTH DAILY. 02/06/21 02/06/22  Salley Slaughter, NP  ferrous sulfate 325 (65 FE) MG tablet Take 1 tablet (325 mg total) by mouth daily with breakfast. 08/01/21   [name], MD  glucose blood test strip Use as instructedCheck blood sugar three times daily. E11.40 07/11/20   [name], MD  insulin glargine (LANTUS) 100 UNIT/ML Solostar Pen Inject 55 Units into the skin at bedtime. 06/30/21   [name], MD  insulin lispro (HUMALOG  KWIKPEN) 100 UNIT/ML KwikPen Inject 4 units before breakfast and dinner. Patient taking differently: Inject 4 Units into the skin every evening. 07/05/20   Camillia Herter, NP  risperiDONE (RISPERDAL) 3 MG tablet TAKE 1 TABLET (3 MG TOTAL) BY MOUTH AT BEDTIME. 02/06/21 02/06/22  Eulis Canner E, NP  torsemide (DEMADEX) 20 MG tablet Take 2 tablets (40 mg total) by mouth daily. 07/26/21   Barrett, Evelene Croon, PA-C  Insulin NPH Isophane & Regular (RELION 70/30 [place]) Inject 35 Units into the skin 2 (two) times daily.  08/17/14  [provider]    Allergies    Elemental sulfur, Hydralazine hcl, Hydrocodone, Metformin and related, Other, Plaquenil [hydroxychloroquine sulfate], Shellfish-derived products, Shrimp (diagnostic), and Sulfa antibiotics  Review of Systems   Review of Systems  Constitutional:  Negative for chills and fever.  Respiratory:  Negative for shortness of breath.   Cardiovascular:  Positive for leg swelling. Negative for chest pain.  Gastrointestinal:   Negative for constipation, diarrhea, nausea and vomiting.  Endocrine: Positive for polydipsia and polyuria.  Genitourinary:  Negative for dysuria.  Neurological:  Negative for dizziness, light-headedness, numbness and headaches.   Physical Exam Updated Vital Signs BP (!) 180/82   Pulse 96   Temp (!) 101 F (38.3 C) (Oral)   Resp (!) 25   SpO2 98%   Physical Exam Constitutional:      Appearance: She is ill-appearing.     Comments: Patient appeared fatigue, ill appearing, slow to respond to questions during interview  HENT:     Head: Normocephalic and atraumatic.  Cardiovascular:     Rate and Rhythm: Normal rate and regular rhythm.     Heart sounds: Normal heart sounds.  Pulmonary:     Effort: Pulmonary effort is normal.     Breath sounds: Decreased breath sounds present.  Abdominal:     General: Abdomen is protuberant. Bowel sounds are normal.     Palpations: Abdomen is soft.     Tenderness: There is no abdominal tenderness.  Musculoskeletal:     Right lower leg: 1+ Edema present.     Left lower leg: 1+ Edema present.     Comments: Bilat lower legs are warm to touch  Feet:     Comments: 2inch horizontal wound located at the dorsal surface of her left foot.  Necrotic; erythematous; foul smelling wound present on left heel. Skin:    General: Skin is warm and dry.     Comments: Bilateral lower extremities- dry, cracked skin. Pitting edema noted.  Neurological:     Mental Status: She is alert.  Psychiatric:        Behavior: Behavior is cooperative.      ED Results / Procedures / Treatments   Labs (all labs ordered are listed, but only abnormal results are displayed) Labs Reviewed  CBC WITH DIFFERENTIAL/PLATELET - Abnormal; Notable for the following components:      Result Value   WBC 17.1 (*)    RBC 2.95 (*)    Hemoglobin 8.2 (*)    HCT 27.1 (*)    Neutro Abs 13.8 (*)    Monocytes Absolute 1.6 (*)    Abs Immature Granulocytes 0.10 (*)    All other components  within normal limits  BASIC METABOLIC PANEL - Abnormal; Notable for the following components:   Sodium 132 (*)    Glucose, Bld 177 (*)    BUN 39 (*)    Creatinine, Ser 3.71 (*)    Calcium 8.7 (*)  GFR, Estimated 13 (*)    All other components within normal limits  HEMOGLOBIN A1C - Abnormal; Notable for the following components:   Hgb A1c MFr Bld 6.8 (*)    All other components within normal limits  CULTURE, BLOOD (ROUTINE X 2)  CULTURE, BLOOD (ROUTINE X 2)  LACTIC ACID, PLASMA  LACTIC ACID, PLASMA    EKG None  Radiology MR FOOT LEFT WO CONTRAST  Result Date: 09/04/2021 CLINICAL DATA:  Diabetic left heel wound EXAM: MRI OF THE LEFT FOOT WITHOUT CONTRAST TECHNIQUE: Multiplanar, multisequence MR imaging of the left hindfoot was performed. No intravenous contrast was administered. COMPARISON:  X-ray 09/04/2021 FINDINGS: Technical Note: Despite efforts by the technologist and patient, motion artifact is present on today's exam and could not be eliminated. This reduces exam sensitivity and specificity. Bones/Joint/Cartilage No acute fracture or dislocation. No bony erosion or marrow replacement. No bone marrow edema. No joint effusion. No significant arthropathy of the ankle or hindfoot. Ligaments Ligamentous structures about the ankle appear grossly intact within the limitations of this motion limited examination. Muscles and Tendons Diffuse edema of the lower leg and foot musculature which may reflect denervation changes and/or myositis. Tendinous structures appear intact. No tenosynovitis. Soft tissues Plantar ulceration underlying the posterior calcaneus. Diffuse soft tissue edema about the ankle and hindfoot. No organized or drainable fluid collections. IMPRESSION: 1. No evidence of osteomyelitis of the left hindfoot. 2. Plantar ulceration underlying the posterior calcaneus. Diffuse soft tissue edema about the ankle and hindfoot which may reflect cellulitis. No organized or drainable fluid  collections. 3. Diffuse edema of the lower leg and foot musculature which may reflect denervation changes and/or myositis. Electronically Signed   By: Davina Poke D.O.   On: 09/04/2021 14:13   DG Foot Complete Left  Result Date: 09/04/2021 CLINICAL DATA:  Left foot infection. EXAM: LEFT FOOT - COMPLETE 3+ VIEW COMPARISON:  November 17, 2013. FINDINGS: There is no evidence of fracture or dislocation. No lytic destruction is seen to suggest osteomyelitis. There is no evidence of arthropathy or other focal bone abnormality. Soft tissues are unremarkable. IMPRESSION: Negative. Electronically Signed   By: Marijo Conception M.D.   On: 09/04/2021 13:34    Procedures Procedures   Medications Ordered in ED Medications  acetaminophen (TYLENOL) tablet 1,000 mg (has no administration in time range)  piperacillin-tazobactam (ZOSYN) IVPB 3.375 g (has no administration in time range)    Followed by  piperacillin-tazobactam (ZOSYN) IVPB 3.375 g (has no administration in time range)  vancomycin (VANCOREADY) IVPB 2000 mg/400 mL (has no administration in time range)  vancomycin variable dose per unstable renal function (pharmacist dosing) (has no administration in time range)    ED Course  I have reviewed the triage vital signs and the nursing notes.  Pertinent labs & imaging results that were available during my care of the patient were reviewed by me and considered in my medical decision making (see chart for details).    MDM Rules/Calculators/A&P                           Cellulitis of left foot/diabetic foot wound Patient presented with complaint of left foot infection that she noticed 3 weeks ago. She denies trauma to the foot, but rather "rough socks" breaking the dorsal surface of the left foot. There is also a preexisting left heel wound present with unknown duration. Pt stated that the foot specialist advised them to come to the ED due  to the wound on the heel appears necrotic. Initial labs and  vitals reveal leukocytosis and fever of 101. Lactate WNL. Blood cultures pending. X-ray of left foot unremarkable; MRI of left foot shows no evidence of osteomyelitis. Patient will be admitted for further evaluation.   Final Clinical Impression(s) / ED Diagnoses Final diagnoses:  Cellulitis of left foot    Rx / DC Orders ED Discharge Orders     None        Timothy Lasso, MD 09/04/21 1423    Lucrezia Starch, MD 09/11/21 [phone]

## 2021-09-04 NOTE — H&P (Addendum)
Date: 09/04/2021               Patient Name:  Nancy [name] MRN: [id number]  DOB: Jul 30, 1961 Age / Sex: 60 y.o., female   PCP: [name], MD              Medical Service: Internal Medicine Teaching Service              Attending Physician: Dr. Sid Falcon, MD    First Contact: Shea Evans, MS3 Pager: [phone]  Second Contact: Dr. Dr. Rick Duff, MD Pager: [phone]  Third Contact Dr. Dr. Farrel Gordon, DO Pager: [phone]       After Hours (After 5p/  First Contact Pager: [phone]  weekends / holidays): Second Contact Pager: [phone]   Chief Complaint: Left foot pain  History of Present Illness: Nancy [name] is a 60 year old female with a past medical history of Acute Diastolic CHF with a pertinent family history of CHF in both parents, CKD Stage IV, TIA, and Type II DM who presented with left foot pain. Since June, she has had volume overload with progressive weight gain and lower extremity edema. Her last foot exam was with her primary care provider 2 months ago, and at that time, she was told her foot was normal appearing with normal sensation. Last month, she was hospitalized for a heart failure exacerbation. For the past 3 weeks, she has had difficulty standing because of pain in her left foot. The patient said after hospitalization, she kept wearing the hospital socks. They were so tight that they were cutting into the skin of her foot but she did not realize it until she took her socks off to get her toes done. The socks had cut into the skin across dorsal surface of both feet.  She was told by a podiatrist earlier today that she now has an ulcer on the bottom of her left foot. The podiatrist suggested she come to the hospital. It is located at the heel, and she had not seen the appearance of the bottom of her foot before today. She has diabetic neuropathy, and she has also had a stroke, which she says impacted her left leg more. She has had a sore on the  bottom other left foot in the past, but she says it mostly healed on its own. She did soak the foot in epsom salt, but she has not tried any remedies for the current ulcer. She currently has numbness in her left foot and 8/10 severe pain. She said she has not noticed any blood in her socks, but she notices clear/yellow drainage occasionally. She denies fevers and chills. She is able to use her wheelchair and cane to get around.  Meds:  Current Meds  Medication Sig   acetaminophen (TYLENOL) 500 MG tablet Take 500 mg by mouth every 6 (six) hours as needed for moderate pain or headache.   amLODipine (NORVASC) 5 MG tablet Take 1 tablet (5 mg total) by mouth daily.   aspirin EC 81 MG tablet Take 1 tablet (81 mg total) by mouth daily.   atorvastatin (LIPITOR) 40 MG tablet Take 1 tablet (40 mg total) by mouth daily.   carvedilol (COREG) 12.5 MG tablet Take 1 tablet (12.5 mg total) by mouth 2 (two) times daily with a meal.   DULoxetine (CYMBALTA) 20 MG capsule TAKE 1 CAPSULE (20 MG TOTAL) BY MOUTH DAILY.   ferrous sulfate 325 (65 FE) MG tablet Take 1 tablet (325 mg total)  by mouth daily with breakfast.   insulin glargine (LANTUS) 100 UNIT/ML Solostar Pen Inject 55 Units into the skin at bedtime.   insulin lispro (HUMALOG KWIKPEN) 100 UNIT/ML KwikPen Inject 4 units before breakfast and dinner. (Patient taking differently: Inject 4 Units into the skin every evening.)   risperiDONE (RISPERDAL) 3 MG tablet TAKE 1 TABLET (3 MG TOTAL) BY MOUTH AT BEDTIME.   torsemide (DEMADEX) 20 MG tablet Take 2 tablets (40 mg total) by mouth daily.  The patient said she has no difficulty with compliance or trouble affording her mediations.   Allergies: Patient reported that she is allergic to vicodin (reaction: upset stomach), metformin (reaction: upset stomach with pain), and sulfa (reaction: upset stomach). She said that she has other allergies but could not remember them. Allergies as of 09/04/2021 - Review Complete  09/04/2021  Allergen Reaction Noted   Elemental sulfur Hives and Other (See Comments) 11/17/2013   Hydralazine hcl Other (See Comments) 11/08/2019   Hydrocodone Itching and Other (See Comments) 11/17/2013   Metformin and related Nausea And Vomiting and Other (See Comments) 09/21/2014   Other Nausea Only and Other (See Comments) 05/09/2021   Plaquenil [hydroxychloroquine sulfate] Hives 10/07/2018   Shellfish-derived products Nausea Only and Other (See Comments) 05/09/2021   Shrimp (diagnostic) Nausea Only and Other (See Comments) 04/24/2021   Sulfa antibiotics Hives 05/09/2021   Past Medical History:  Diagnosis Date   Anemia 2006   Depression 2014   previously on amitryptiline    Diabetes mellitus with neurological manifestation ([place]) 2006   Diabetic peripheral neuropathy ([place]) 04/26/2020   Fracture of left ankle 1997    Gastroparesis 07/2016   HOH (hard of hearing) 2004    Hyperlipidemia 2006   Hypertension 2006   IBS (irritable bowel syndrome) 2002    Leukopenia 2015    Macular degeneration 11/2019   Shingles 2009    Stroke [facility]) 2020   Thyroid nodule 2004  She regularly follows up with her PCP Dr. Wynetta Emery and goes to [facility]. She checks her blood sugars daily before eating, and she says her sugars range from 178-197.  Surgeries: Gallbladder removal, C-Section  Family History: The patient said her mother and father both had heart failure and had heart attacks.  Social History: The patient said she is married and lives with [name]) in [place], [place]. She has one daughter, who lives in [place], [place]. She does not work and is on disability. She eats one or two meals a day, and she likes to eat fish, baked chicken, cabbage, and macaroni and cheese. She has a fluid restriction of 64 and is adherent. She has difficulty sleeping sometimes because she has to sleep sitting up because of her heart failure. She says she smoked 5 years ago. She smoked 1/2 pack  per day for 2 years and then quit. She denies alcohol and other substance use.   Review of Systems: A complete ROS was negative except as per HPI.   Physical Exam: Blood pressure (!) 165/76, pulse 87, temperature (!) 101 F (38.3 C), temperature source Oral, resp. rate (!) 21, SpO2 98 %.  Physical Exam Vitals reviewed.  Constitutional:      General: She is not in acute distress.    Appearance: She is ill-appearing.  HENT:     Head: Normocephalic and atraumatic.  Eyes:     Extraocular Movements: Extraocular movements intact.     Pupils:     Right eye: Pupil is reactive.  Left eye: Pupil is reactive.     Comments: Left ptosis  Cardiovascular:     Rate and Rhythm: Normal rate.     Comments: DP and PT pulses palpable bilaterally. JVD not appreciated. Pulmonary:     Comments: Decreased breath sounds Abdominal:     General: Bowel sounds are normal.     Palpations: Abdomen is soft.     Tenderness: There is no abdominal tenderness.  Musculoskeletal:     Comments: BLE swelling  Skin:    General: Skin is dry.     Comments: Skin of her feet were cold. Left foot necrotic ulcer near the heel on the plantar surface of the foot that was at least 5cmx5cm. Another area of ulceration on the lateral side of the left foot. Erythematous horizontal areas across the dorsal surface of both feet where socks had cut into the skin and exposed underlying tissues.  Neurological:     Mental Status: She is alert and oriented to person, place, and time.     Cranial Nerves: Cranial nerves are intact. No dysarthria or facial asymmetry.     Motor: No weakness.     Comments: RLE weaker with reduced ROM compared to left, but good strength bilaterally  Psychiatric:        Behavior: Behavior normal.            CBC    Component Value Date/Time   WBC 17.1 (H) 09/04/2021 1145   RBC 2.95 (L) 09/04/2021 1145   HGB 8.2 (L) 09/04/2021 1145   HGB 8.8 (L) 06/30/2021 1226   HGB 12.5 07/08/2008 1551    HCT 27.1 (L) 09/04/2021 1145   HCT 28.3 (L) 06/30/2021 1226   HCT 37.3 07/08/2008 1551   PLT 317 09/04/2021 1145   PLT 304 06/30/2021 1226   MCV 91.9 09/04/2021 1145   MCV 89 06/30/2021 1226   MCV 84.7 07/08/2008 1551   MCH 27.8 09/04/2021 1145   MCHC 30.3 09/04/2021 1145   RDW 14.5 09/04/2021 1145   RDW 13.6 06/30/2021 1226   RDW 13.0 07/08/2008 1551   LYMPHSABS 1.4 09/04/2021 1145   LYMPHSABS 2.5 02/23/2021 1523   LYMPHSABS 4.6 (H) 07/08/2008 1551   MONOABS 1.6 (H) 09/04/2021 1145   MONOABS 0.6 07/08/2008 1551   EOSABS 0.2 09/04/2021 1145   EOSABS 0.5 (H) 02/23/2021 1523   BASOSABS 0.1 09/04/2021 1145   BASOSABS 0.1 02/23/2021 1523   BASOSABS 0.1 07/08/2008 1551   CMP     Component Value Date/Time   NA 132 (L) 09/04/2021 1145   NA 139 08/01/2021 1031   K 4.2 09/04/2021 1145   CL 99 09/04/2021 1145   CO2 22 09/04/2021 1145   GLUCOSE 177 (H) 09/04/2021 1145   BUN 39 (H) 09/04/2021 1145   BUN 41 (H) 08/01/2021 1031   CREATININE 3.71 (H) 09/04/2021 1145   CREATININE 0.73 08/17/2014 1641   CALCIUM 8.7 (L) 09/04/2021 1145   PROT 6.8 08/12/2021 1837   PROT 6.6 06/30/2021 1226   ALBUMIN 3.0 (L) 08/12/2021 1837   ALBUMIN 3.5 (L) 06/30/2021 1226   AST 11 (L) 08/12/2021 1837   ALT 9 08/12/2021 1837   ALKPHOS 53 08/12/2021 1837   BILITOT 0.6 08/12/2021 1837   BILITOT 0.3 06/30/2021 1226   GFRNONAA 13 (L) 09/04/2021 1145   GFRAA 58 (L) 05/06/2020 1207     Imaging  Left Foot X-ray:  LEFT FOOT - COMPLETE 3+ VIEW COMPARISON:  November 17, 2013. FINDINGS: There is no evidence of  fracture or dislocation. No lytic destruction is seen to suggest osteomyelitis. There is no evidence of arthropathy or other focal bone abnormality. Soft tissues are unremarkable.  IMPRESSION: Negative.  Left Foot MRI: MRI OF THE LEFT FOOT WITHOUT CONTRAST TECHNIQUE: Multiplanar, multisequence MR imaging of the left hindfoot was performed. No intravenous contrast was administered. COMPARISON:   X-ray 09/04/2021 FINDINGS: Technical Note: Despite efforts by the technologist and patient, motion artifact is present on today's exam and could not be eliminated. This reduces exam sensitivity and specificity.    Bones/Joint/Cartilage: No acute fracture or dislocation. No bony erosion or marrow replacement. No bone marrow edema. No joint effusion. No significant arthropathy of the ankle or hindfoot.     Ligaments: Ligamentous structures about the ankle appear grossly intact within the limitations of this motion limited examination.     Muscles and Tendons: Diffuse edema of the lower leg and foot musculature which may reflect denervation changes and/or myositis. Tendinous structures appear intact. No tenosynovitis.     Soft tissues: Plantar ulceration underlying the posterior calcaneus. Diffuse soft tissue edema about the ankle and hindfoot. No organized or drainable fluid collections.   IMPRESSION: 1. No evidence of osteomyelitis of the left hindfoot. 2. Plantar ulceration underlying the posterior calcaneus. Diffuse soft tissue edema about the ankle and hindfoot which may reflect cellulitis. No organized or drainable fluid collections. 3. Diffuse edema of the lower leg and foot musculature which may reflect denervation changes and/or myositis.   Assessment & Plan by Problem: Active Problems:   Diabetic foot ulcer [facility])  Mrs. Millenia Waldvogel is a 60 year old female with a past medical history of Acute Diastolic CHF with a pertinent family history of CHF in both parents, CKD, TIA, and Type II DM who presented with left foot pain and is being admitted for diabetic foot ulcer with concerns for infection.   #Diabetic Foot Ulcer #Left Foot Ulcer #Left Foot Pain #Left and Right Feet Skin Lacerations Patient has diabetic foot ulcer, and she does not know how long it has been present. Diabetic neuropathy caused it to go undetected because the patient did not have any noticeable difference in  sensation. Her walking was hindered, and she had never been to a podiatrist before, but she scheduled an appointment herself. The podiatrist recommended returning to the hospital. Home blood sugars of 178-197 before meals are elevated, but her A1c is 6.8 today. The fever at admission (101 F) and elevated white count raise concern for infection. Foot x-ray was unremarkable. MRI showed no evidence of osteomyelitis but plantar ulceration of posterior calcaneus. There is also soft tissue edema about the ankle and hindfoot, which could indicate there is cellulitis, but the fluid is not organized or drainable, and the diffuse edema of musculature could reflect denervation changes.  - Follow up blood cultures - Zosyn 3.375g q8h unless change in renal function - Vancomycin 2000 mg once - De-escalate antibiotics as appropriate pending culture results  - Oxycodone 6mg  tablet q6 PRN and tylenol 650mg  q6 PRN for pain - Wound care consult.   #Acute Diastolic CHF Patient has CHF. ECHO in June 2022 showed left ventricle ejection fraction 55-60% with no regional wall motion abnormalities. Right ventricular systolic function was normal with mildly elevated systolic pressure. Right heart cath at the end of July showed elevated right and left heart filling pressures, prominent v-wave in PCWP tracing, pulmonary venous hypertension, and preserved cardiac output. On exam, she appeared hypervolemic and feels she is experiencing fluid overload with increased swelling in  her legs bilaterally. She says she has been cognizant of fluid restriction and has no difficulty taking her medications. She requested lasix. - IV lasix 40mg  X 2 doses and coreg 12.5 mg BID - Strict I/O's - Daily weight  #Type II DM Patient monitors her blood sugar once daily before meals, and her home sugars have been between 178-197. A1c today is 6.8.  - Continue lantus and humalog + SSI  CKD Stage IV Patient has had decline in GFR in the past year with  repeated insults of AKI. When she was diuresed recently in August, she experienced AKI because diuresis tipped the patient from euvolemic to slightly hypovolemic. With frequent volume overload, decline in kidney function could be related to a cardiorenal syndrome. - Hold home torsemide while getting IV lasix - Monitor blood pressure - Monitor urine output  Best Practice: Diet: Thin fluids IVF: None VTE: Heparin 5,000 units Code: Full AB: Zosyn 3.375g q8h per pharmacy, one dose of Vancomycin 2000mg  Dispo: Admit patient to Inpatient with expected length of stay greater than 2 midnights.  Signed: Danie Chandler, Medical Student 09/04/2021, 4:19 PM  Pager: @[phone] @

## 2021-09-05 ENCOUNTER — Observation Stay (HOSPITAL_COMMUNITY): Payer: Medicaid Other

## 2021-09-05 DIAGNOSIS — D631 Anemia in chronic kidney disease: Secondary | ICD-10-CM | POA: Diagnosis present

## 2021-09-05 DIAGNOSIS — R34 Anuria and oliguria: Secondary | ICD-10-CM | POA: Diagnosis not present

## 2021-09-05 DIAGNOSIS — Z515 Encounter for palliative care: Secondary | ICD-10-CM | POA: Diagnosis not present

## 2021-09-05 DIAGNOSIS — I130 Hypertensive heart and chronic kidney disease with heart failure and stage 1 through stage 4 chronic kidney disease, or unspecified chronic kidney disease: Secondary | ICD-10-CM | POA: Diagnosis present

## 2021-09-05 DIAGNOSIS — I69354 Hemiplegia and hemiparesis following cerebral infarction affecting left non-dominant side: Secondary | ICD-10-CM | POA: Diagnosis not present

## 2021-09-05 DIAGNOSIS — F32A Depression, unspecified: Secondary | ICD-10-CM | POA: Diagnosis present

## 2021-09-05 DIAGNOSIS — E11621 Type 2 diabetes mellitus with foot ulcer: Secondary | ICD-10-CM | POA: Diagnosis not present

## 2021-09-05 DIAGNOSIS — E1152 Type 2 diabetes mellitus with diabetic peripheral angiopathy with gangrene: Secondary | ICD-10-CM | POA: Diagnosis present

## 2021-09-05 DIAGNOSIS — Z20822 Contact with and (suspected) exposure to covid-19: Secondary | ICD-10-CM | POA: Diagnosis present

## 2021-09-05 DIAGNOSIS — L0390 Cellulitis, unspecified: Secondary | ICD-10-CM

## 2021-09-05 DIAGNOSIS — Z6839 Body mass index (BMI) 39.0-39.9, adult: Secondary | ICD-10-CM | POA: Diagnosis not present

## 2021-09-05 DIAGNOSIS — E1165 Type 2 diabetes mellitus with hyperglycemia: Secondary | ICD-10-CM | POA: Diagnosis present

## 2021-09-05 DIAGNOSIS — E1143 Type 2 diabetes mellitus with diabetic autonomic (poly)neuropathy: Secondary | ICD-10-CM | POA: Diagnosis present

## 2021-09-05 DIAGNOSIS — E1122 Type 2 diabetes mellitus with diabetic chronic kidney disease: Secondary | ICD-10-CM | POA: Diagnosis present

## 2021-09-05 DIAGNOSIS — Z66 Do not resuscitate: Secondary | ICD-10-CM | POA: Diagnosis not present

## 2021-09-05 DIAGNOSIS — L97529 Non-pressure chronic ulcer of other part of left foot with unspecified severity: Secondary | ICD-10-CM | POA: Diagnosis present

## 2021-09-05 DIAGNOSIS — L03116 Cellulitis of left lower limb: Secondary | ICD-10-CM

## 2021-09-05 DIAGNOSIS — F03918 Unspecified dementia, unspecified severity, with other behavioral disturbance: Secondary | ICD-10-CM | POA: Diagnosis present

## 2021-09-05 DIAGNOSIS — E1149 Type 2 diabetes mellitus with other diabetic neurological complication: Secondary | ICD-10-CM | POA: Diagnosis present

## 2021-09-05 DIAGNOSIS — N184 Chronic kidney disease, stage 4 (severe): Secondary | ICD-10-CM | POA: Diagnosis present

## 2021-09-05 DIAGNOSIS — E1142 Type 2 diabetes mellitus with diabetic polyneuropathy: Secondary | ICD-10-CM | POA: Diagnosis present

## 2021-09-05 DIAGNOSIS — L97402 Non-pressure chronic ulcer of unspecified heel and midfoot with fat layer exposed: Secondary | ICD-10-CM

## 2021-09-05 DIAGNOSIS — E669 Obesity, unspecified: Secondary | ICD-10-CM | POA: Diagnosis present

## 2021-09-05 DIAGNOSIS — I5033 Acute on chronic diastolic (congestive) heart failure: Secondary | ICD-10-CM | POA: Diagnosis present

## 2021-09-05 DIAGNOSIS — L89152 Pressure ulcer of sacral region, stage 2: Secondary | ICD-10-CM | POA: Diagnosis present

## 2021-09-05 LAB — COMPREHENSIVE METABOLIC PANEL
ALT: 10 U/L (ref 0–44)
AST: 9 U/L — ABNORMAL LOW (ref 15–41)
Albumin: 2.5 g/dL — ABNORMAL LOW (ref 3.5–5.0)
Alkaline Phosphatase: 37 U/L — ABNORMAL LOW (ref 38–126)
Anion gap: 10 (ref 5–15)
BUN: 44 mg/dL — ABNORMAL HIGH (ref 8–23)
CO2: 22 mmol/L (ref 22–32)
Calcium: 8.5 mg/dL — ABNORMAL LOW (ref 8.9–10.3)
Chloride: 101 mmol/L (ref 98–111)
Creatinine, Ser: 4.14 mg/dL — ABNORMAL HIGH (ref 0.44–1.00)
GFR, Estimated: 12 mL/min — ABNORMAL LOW (ref >60)
Glucose, Bld: 119 mg/dL — ABNORMAL HIGH (ref 70–99)
Potassium: 4.1 mmol/L (ref 3.5–5.1)
Sodium: 133 mmol/L — ABNORMAL LOW (ref 135–145)
Total Bilirubin: 0.7 mg/dL (ref 0.3–1.2)
Total Protein: 6.5 g/dL (ref 6.5–8.1)

## 2021-09-05 LAB — IRON AND TIBC
Iron: 16 ug/dL — ABNORMAL LOW (ref 28–170)
Saturation Ratios: 9 % — ABNORMAL LOW (ref 10.4–31.8)
TIBC: 182 ug/dL — ABNORMAL LOW (ref 250–450)
UIBC: 166 ug/dL

## 2021-09-05 LAB — GLUCOSE, CAPILLARY
Glucose-Capillary: 118 mg/dL — ABNORMAL HIGH (ref 70–99)
Glucose-Capillary: 135 mg/dL — ABNORMAL HIGH (ref 70–99)
Glucose-Capillary: 126 mg/dL — ABNORMAL HIGH (ref 70–99)

## 2021-09-05 LAB — CBC
HCT: 23.2 % — ABNORMAL LOW (ref 36.0–46.0)
Hemoglobin: 7.2 g/dL — ABNORMAL LOW (ref 12.0–15.0)
MCH: 28.7 pg (ref 26.0–34.0)
MCHC: 31 g/dL (ref 30.0–36.0)
MCV: 92.4 fL (ref 80.0–100.0)
Platelets: 279 10*3/uL (ref 150–400)
RBC: 2.51 MIL/uL — ABNORMAL LOW (ref 3.87–5.11)
RDW: 14.6 % (ref 11.5–15.5)
WBC: 12.3 10*3/uL — ABNORMAL HIGH (ref 4.0–10.5)
nRBC: 0 % (ref 0.0–0.2)

## 2021-09-05 LAB — MAGNESIUM: Magnesium: 2.3 mg/dL (ref 1.7–2.4)

## 2021-09-05 LAB — FOLATE: Folate: 21.8 ng/mL (ref >5.9)

## 2021-09-05 LAB — FERRITIN: Ferritin: 165 ng/mL (ref 11–307)

## 2021-09-05 LAB — VITAMIN B12: Vitamin B-12: 360 pg/mL (ref 180–914)

## 2021-09-05 MED ORDER — DULOXETINE HCL 20 MG PO CPEP
20.0000 mg | ORAL_CAPSULE | Freq: Every day | ORAL | Status: DC
  Administered 2021-09-06 – 2021-09-21 (×16): 20 mg via ORAL
  Filled 2021-09-05 (×16): qty 1

## 2021-09-05 MED ORDER — SODIUM CHLORIDE 0.9 % IV SOLN
250.0000 mg | Freq: Every day | INTRAVENOUS | Status: AC
  Administered 2021-09-05 – 2021-09-06 (×2): 250 mg via INTRAVENOUS
  Filled 2021-09-05 (×2): qty 20

## 2021-09-05 MED ORDER — INSULIN ASPART 100 UNIT/ML IJ SOLN
0.0000 [IU] | Freq: Three times a day (TID) | INTRAMUSCULAR | Status: DC
  Administered 2021-09-06: 3 [IU] via SUBCUTANEOUS
  Administered 2021-09-06 – 2021-09-10 (×8): 2 [IU] via SUBCUTANEOUS
  Administered 2021-09-11: 3 [IU] via SUBCUTANEOUS
  Administered 2021-09-11: 2 [IU] via SUBCUTANEOUS
  Administered 2021-09-12: 3 [IU] via SUBCUTANEOUS
  Administered 2021-09-13: 8 [IU] via SUBCUTANEOUS
  Administered 2021-09-13 – 2021-09-14 (×3): 3 [IU] via SUBCUTANEOUS
  Administered 2021-09-14 – 2021-09-15 (×2): 2 [IU] via SUBCUTANEOUS
  Administered 2021-09-15: 3 [IU] via SUBCUTANEOUS
  Administered 2021-09-16: 2 [IU] via SUBCUTANEOUS
  Administered 2021-09-16: 5 [IU] via SUBCUTANEOUS
  Administered 2021-09-16 – 2021-09-18 (×5): 3 [IU] via SUBCUTANEOUS
  Administered 2021-09-18 – 2021-09-19 (×2): 2 [IU] via SUBCUTANEOUS
  Administered 2021-09-19 – 2021-09-20 (×3): 3 [IU] via SUBCUTANEOUS

## 2021-09-05 MED ORDER — PIPERACILLIN-TAZOBACTAM 3.375 G IVPB
3.3750 g | Freq: Three times a day (TID) | INTRAVENOUS | Status: DC
  Administered 2021-09-05 – 2021-09-06 (×3): 3.375 g via INTRAVENOUS
  Filled 2021-09-05 (×4): qty 50

## 2021-09-05 MED ORDER — RISPERIDONE 3 MG PO TABS
3.0000 mg | ORAL_TABLET | Freq: Every day | ORAL | Status: DC
  Administered 2021-09-05 – 2021-09-20 (×16): 3 mg via ORAL
  Filled 2021-09-05 (×17): qty 1

## 2021-09-05 NOTE — Progress Notes (Signed)
ABI's have been completed. Preliminary results can be found in CV Proc through chart review.   09/05/21 4:38 PM Nancy [name] RVT

## 2021-09-05 NOTE — Consult Note (Signed)
WOC Nurse Consult Note: Patient receiving care in [facility] 5N18. Reason for Consult: BLE wounds  Wound type: Patient with DM type 2, CKD, CHF. According to the H&P from 09/04/21 at 1619, "Her last foot exam was with her primary care provider 2 months ago, and at that time, she was told her foot was normal appearing with normal sensation. Last month, she was hospitalized for a heart failure exacerbation. For the past 3 weeks, she has had difficulty standing because of pain in her left foot. The patient said after hospitalization, she kept wearing the hospital socks. They were so tight that they were cutting into the skin of her foot but she did not realize it until she took her socks off to get her toes done. The socks had cut into the skin across dorsal surface of both feet." Pressure Injury POA: Yes/No/NA Measurement: To be provided by the bedside RN in the flowsheet section  Wound bed: see photos Drainage (amount, consistency, odor)  Periwound: Dressing procedure/placement/frequency: I spoke with the Resident for pager [phone].  I understand an ABI was ordered yesterday, which will help provide information in the course of treatment for the left foot especially. We also talked about possibly contacting Dr. Sharol Given and inquiring on the use of the VIVE compressions socks he has access to for this patient's feet.  The resident explained she will communicate this information to her team when they round.  For now, I have ordered twice daily application of iodine to all wounds on both feet.  This will help reduce microbial contamination, assist in keeping the wounds dry and stable, and allow [place] to formulate a plan for this patient.  Also, I have ordered Prevalon heel lift boots.  [place] nurse will not follow at this time.  Please re-consult the [place] team if needed.  Val Riles, RN, MSN, CWOCN, CNS-BC, pager [phone]

## 2021-09-05 NOTE — Progress Notes (Addendum)
Nancy [name] is a 60 year old female with a past medical history of diastolic CHF with a pertinent family history of CHF in both parents, CKD Stage IV, TIA, and Type II DM who presented with left foot pain and was admitted for a left heel ulcer secondary to diabetic neuropathy.  Subjective:  Patient said she is feeling better this morning compared to yesterday. She feels no pain as she lays in bed. She has not tried to ambulate. She was relieved to hear that the MRI showed no evidence of osteomyelitis. There were bandages on her feet covering all of the open sores, ulcers, and areas of broken skin bilaterally on the dorsal and plantar surfaces. She was given lasix, and she feels her swelling has gone down "a little bit".  Objective:  Vital signs in last 24 hours: Vitals:   09/05/21 0214 09/05/21 0332 09/05/21 0755 09/05/21 1308  BP: (!) 151/70 (!) 156/97 (!) 156/68 (!) 105/54  Pulse: 69 70 67 70  Resp: 18   17  Temp: 98.1 F (36.7 C) 98.8 F (37.1 C) 98.6 F (37 C) 98.7 F (37.1 C)  TempSrc: Oral Oral Oral Oral  SpO2: 96% 98% 100% 100%  Weight:      Height:       Weight change:   Intake/Output Summary (Last 24 hours) at 09/05/2021 1316 Last data filed at 09/05/2021 0956 Gross per 24 hour  Intake 876 ml  Output --  Net 876 ml   Physical Exam Vitals reviewed.  HENT:     Head: Normocephalic and atraumatic.     Nose: No congestion.  Cardiovascular:     Rate and Rhythm: Normal rate.  Pulmonary:     Effort: No respiratory distress.     Breath sounds: No wheezing.  Abdominal:     General: There is no distension.     Tenderness: There is no abdominal tenderness.  Musculoskeletal:     Comments: BLE 1+ pitting edema  Skin:    General: Skin is dry.     Comments: Both feet had bandages covering the individual sores, ulcers, areas of broken skin  Neurological:     General: No focal deficit present.     Mental Status: She is alert and oriented to person, place,  and time.  Psychiatric:        Behavior: Behavior normal.        Thought Content: Thought content normal.    Assessment/Plan:  Active Problems:   Diabetic foot ulcer ([place])  Nancy [name] is a 60 year old female with a past medical history of Acute Diastolic CHF with a pertinent family history of CHF in both parents, CKD Stage IV, TIA, and Type II DM who presented with left foot pain and was admitted for a left heel ulcer secondary to diabetic neuropathy.  #Diabetic Foot Ulcer, heel of left foot #Diabetic Foot Ulcer, lateral foot #Left and Right Foot Skin Lacerations Patient has a diabetic foot ulcers likely due to diabetic neuropathy and is uncertain how long they have been present . She has had trouble walking due to pain. MRI showed no evidence of osteomyelitis, but showed signs of possible cellulitis and denervation changes. One dose vancomycin 2000 mg given yesterday. ABIs pending.  Per chart review, Unna boots placed during prior hospitalization may have been wrapped for prolonged duration, and this could have caused the lacerations on dorsum of both feet. -F/U ABI results - Follow up blood cultures (NGTD) - Zosyn 3.375g q8h, monitor for change  in renal function - De-escalate antibiotics as appropriate pending culture results  - Oxycodone 6mg  tablet q6 PRN and tylenol 650mg  q6 PRN for pain - Wound care consulted, appreciate recommendations      - Contact Dr. Sharol Given to inquire about VIVE compression socks      - Twice daly iodine application to all wounds on both feet      - Prevalon heel lift boots ordered   #Acute on chronic HFpEF exacerbation Patient presented with lower extremity edema concerning for acute on chronic HFpEF exacerbation, which has improved with lasix. Patient had echo 05/22 with EF of 55 to 60%.  - IV lasix 40mg  X 2 doses and coreg 12.5 mg BID - Hold home torsemide (30mg  daily) while getting IV lasix - Strict I/O's - Daily weight   #Type II  DM #Diabetic neuropathy Patient monitors her blood sugar once daily before meals, and her home sugars have been between 178-197. Most recent A1c is 6.8.  - Continue lantus and humalog + SSI -Continue home duloxetine 20 mg daily   #CKD Stage IV Patient has had decline in GFR in the past year with repeated insults of AKI. When she was diuresed recently in August, she experienced AKI because diuresis tipped the patient from euvolemic to slightly hypovolemic. With frequent volume overload, decline in kidney function could be related to a cardiorenal syndrome. - Hold home torsemide (30mg  daily) while getting IV lasix - Monitor BP - Monitor urine output  #History of tactile hallucinations -Patient was continued on risperidone 3 mg nightly  Best Practice: Diet: Heart healthy diet IVF: None VTE: Heparin 5,000 units Code: Full AB: Zosyn 3.375g q8h per pharmacy, one dose of Vancomycin 2000mg  Dispo: Admit patient to Inpatient with expected length of stay greater than 2 midnights.   LOS: 0 days   Danie Chandler, Medical Student 09/05/2021, 1:16 PM  Attestation for Student Documentation:  I personally was present and performed or re-performed the history, physical exam and medical decision-making activities of this service and have verified that the service and findings are accurately documented in the student's note.  Wayland Denis, MD 09/05/2021, 4:34 PM

## 2021-09-06 DIAGNOSIS — Z87891 Personal history of nicotine dependence: Secondary | ICD-10-CM

## 2021-09-06 DIAGNOSIS — Z7982 Long term (current) use of aspirin: Secondary | ICD-10-CM

## 2021-09-06 DIAGNOSIS — E1122 Type 2 diabetes mellitus with diabetic chronic kidney disease: Secondary | ICD-10-CM

## 2021-09-06 DIAGNOSIS — I509 Heart failure, unspecified: Secondary | ICD-10-CM

## 2021-09-06 DIAGNOSIS — E11621 Type 2 diabetes mellitus with foot ulcer: Secondary | ICD-10-CM

## 2021-09-06 DIAGNOSIS — N184 Chronic kidney disease, stage 4 (severe): Secondary | ICD-10-CM

## 2021-09-06 DIAGNOSIS — L97429 Non-pressure chronic ulcer of left heel and midfoot with unspecified severity: Secondary | ICD-10-CM

## 2021-09-06 LAB — BASIC METABOLIC PANEL
Anion gap: 14 (ref 5–15)
BUN: 48 mg/dL — ABNORMAL HIGH (ref 8–23)
CO2: 18 mmol/L — ABNORMAL LOW (ref 22–32)
Calcium: 8.3 mg/dL — ABNORMAL LOW (ref 8.9–10.3)
Chloride: 100 mmol/L (ref 98–111)
Creatinine, Ser: 4.68 mg/dL — ABNORMAL HIGH (ref 0.44–1.00)
GFR, Estimated: 10 mL/min — ABNORMAL LOW (ref >60)
Glucose, Bld: 88 mg/dL (ref 70–99)
Potassium: 4.4 mmol/L (ref 3.5–5.1)
Sodium: 132 mmol/L — ABNORMAL LOW (ref 135–145)

## 2021-09-06 LAB — GLUCOSE, CAPILLARY
Glucose-Capillary: 137 mg/dL — ABNORMAL HIGH (ref 70–99)
Glucose-Capillary: 166 mg/dL — ABNORMAL HIGH (ref 70–99)
Glucose-Capillary: 177 mg/dL — ABNORMAL HIGH (ref 70–99)

## 2021-09-06 LAB — VANCOMYCIN, RANDOM: Vancomycin Rm: 24

## 2021-09-06 MED ORDER — SODIUM CHLORIDE 0.9 % IV BOLUS
500.0000 mL | Freq: Once | INTRAVENOUS | Status: AC
  Administered 2021-09-06: 500 mL via INTRAVENOUS

## 2021-09-06 MED ORDER — SODIUM CHLORIDE 0.9 % IV SOLN
3.0000 g | Freq: Two times a day (BID) | INTRAVENOUS | Status: DC
  Administered 2021-09-07: 3 g via INTRAVENOUS
  Filled 2021-09-06 (×2): qty 8

## 2021-09-06 MED ORDER — SODIUM CHLORIDE 0.9 % IV SOLN: INTRAVENOUS | Status: DC

## 2021-09-06 MED ORDER — SODIUM CHLORIDE 0.9 % IV SOLN
3.0000 g | INTRAVENOUS | Status: DC
  Administered 2021-09-06: 3 g via INTRAVENOUS
  Filled 2021-09-06: qty 8

## 2021-09-06 NOTE — Evaluation (Signed)
Physical Therapy Evaluation Patient Details Name: Nancy [name] MRN: [id number] DOB: [birth year] Today's Date: 09/06/2021  History of Present Illness  60 y.o. female admitted on 9/19 with necrotic L heel.  MRI (+) for ulceration with possible cellulitis/myositis, (-) for OM.  Recent admission x 3 weeks ago for CHF; per pt, poor fitting hospital socks caused initial foot wound. PMH includes anemia, depression, DM, peripheral neuropathy, HLD, HTN, CVA, CHF, HOH, CKD  Clinical Impression  Pt. Demos transfer difficulty, dec activity tolerance and LE weakness and would benefit from skilled PT in acute care to address deficits indicated in initial eval.  Pt. Was previously mod IND for all functional mobility with use of SPC, is currently req'ing use of RW.  Pt. Is safe to return home with assist from spouse, use of RW and HHC PT.       Recommendations for follow up therapy are one component of a multi-disciplinary discharge planning process, led by the attending physician.  Recommendations may be updated based on patient status, additional functional criteria and insurance authorization.  Follow Up Recommendations Home health PT    Equipment Recommendations   (Pt. already owns necessary equipment)    Recommendations for Other Services OT consult     Precautions / Restrictions Precautions Required Braces or Orthoses:  (B heel lift boots) Restrictions Weight Bearing Restrictions: Yes LLE Weight Bearing: Weight bearing as tolerated Other Position/Activity Restrictions: No WB orders in chart, per NSG, proceed with WBAT for L LE for now.      Mobility  Bed Mobility Overal bed mobility: Modified Independent             General bed mobility comments: Mod I with use of bed rails. Patient Response: Flat affect;Cooperative  Transfers Overall transfer level: Needs assistance Equipment used: Rolling walker (2 wheeled) Transfers: Sit to/from Stand Sit to Stand: Min assist          General transfer comment: Complains of dizziness upon sitting.  Inc rest time prior to standing up. Pt. has SPC in room from home, but is encouraged to use RW due to heel wound (WBAT per NSG).  Pt. completes sit > stand with RW and min A.  Uses rocking technique to build momentum prior to transfer.  Fair standing balance.  Ambulation/Gait Ambulation/Gait assistance: Min assist Gait Distance (Feet): 1 Feet Assistive device: Rolling walker (2 wheeled) Gait Pattern/deviations: Step-to pattern     General Gait Details: Pt. able to take small steps over to chair from EOB with min A for safety.  Demos fair eccentric control on sitting.  Stairs            Wheelchair Mobility    Modified Rankin (Stroke Patients Only)       Balance Overall balance assessment: Mild deficits observed, not formally tested                                           Pertinent Vitals/Pain Pain Assessment: 0-10 Pain Score: 6  Pain Location: B LE Pain Descriptors / Indicators: Discomfort;Aching Pain Intervention(s): Limited activity within patient's tolerance;Repositioned    Home Living Family/patient expects to be discharged to:: Private residence Living Arrangements: Spouse/significant other Available Help at Discharge: Family Type of Home: House       Home Layout: One level Home Equipment: Environmental consultant - 2 wheels;Cane - single point Additional Comments: Pt. states she typically uses SPC  but has 2WW available.    Prior Function Level of Independence: Needs assistance      ADL's / Homemaking Assistance Needed: Pt. reports having assist from spouse at baseline for ADLs.  Comments: Pt. states that spouse works and is out of the house most of the day.  However, reports that he would be able to take time off to help care for her on D/C.     Hand Dominance        Extremity/Trunk Assessment   Upper Extremity Assessment Upper Extremity Assessment: Overall WFL for tasks  assessed    Lower Extremity Assessment Lower Extremity Assessment: Generalized weakness       Communication   Communication: Other (comment) (Slow to respond to questions, inc processing time.)  Cognition Arousal/Alertness: Awake/alert Behavior During Therapy: Flat affect Overall Cognitive Status: Impaired/Different from baseline Area of Impairment: Orientation                 Orientation Level: Time             General Comments: Pt. unable to state date, thinks it's August.  A + O x 2.      General Comments General comments (skin integrity, edema, etc.): Pt. has concerns about what assist she can receive at home due to not having insurance.    Exercises     Assessment/Plan    PT Assessment Patient needs continued PT services  PT Problem List Decreased strength;Decreased mobility;Decreased activity tolerance;Decreased cognition;Decreased balance       PT Treatment Interventions DME instruction;Therapeutic exercise;Gait training;Balance training;Functional mobility training;Therapeutic activities;Patient/family education    PT Goals (Current goals can be found in the Care Plan section)  Acute Rehab PT Goals Patient Stated Goal: Pt's goal is to return home PT Goal Formulation: With patient Time For Goal Achievement: 09/20/21 Potential to Achieve Goals: Good    Frequency Min 3X/week   Barriers to discharge   N/A - per pt, spouse can assist as needed    Co-evaluation               AM-PAC PT "6 Clicks" Mobility  Outcome Measure Help needed turning from your back to your side while in a flat bed without using bedrails?: A Little Help needed moving from lying on your back to sitting on the side of a flat bed without using bedrails?: A Little Help needed moving to and from a bed to a chair (including a wheelchair)?: A Little Help needed standing up from a chair using your arms (e.g., wheelchair or bedside chair)?: A Little Help needed to walk in  hospital room?: A Little Help needed climbing 3-5 steps with a railing? : A Lot 6 Click Score: 17    End of Session Equipment Utilized During Treatment: Gait belt Activity Tolerance: Patient tolerated treatment well;Other (comment) (Limited by dizziness) Patient left: in chair;with call bell/phone within reach;with chair alarm set Nurse Communication: Mobility status PT Visit Diagnosis: Other abnormalities of gait and mobility (R26.89);Muscle weakness (generalized) (M62.81)    Time: 8250-0370 PT Time Calculation (min) (ACUTE ONLY): 28 min   Charges:   PT Evaluation $PT Eval Low Complexity: 1 Low PT Treatments $Therapeutic Activity: 8-22 mins        Everley Evora A. Memorie Yokoyama, PT, DPT Acute Rehabilitation Services Office: [place] 09/06/2021, 11:21 AM

## 2021-09-06 NOTE — Consult Note (Addendum)
Hospital Consult    Reason for Consult:  BLE wounds Requesting Physician:  IM MRN #:  [id number]  History of Present Illness: This is a 60 y.o. female with past medical history significant for CHF, insulin-dependent diabetes mellitus, CKD stage IV.  She was admitted to the hospital with suspected infected diabetic left heel ulceration.  Work-up included right ABI of 0.45 and local ABI of 0.5.  She was started on broad-spectrum antibiotics.  Patient states left heel wound has been present for about 1 month.  She states it is painful to ambulate however prior to presence of wound she was ambulating with a cane.  She denies claudication or rest pain of bilateral lower extremities.  She is a former smoker who quit about 1 year ago.  She is on aspirin daily.  Past Medical History:  Diagnosis Date   Anemia 2006   Depression 2014   previously on amitryptiline    Diabetes mellitus with neurological manifestation ([place]) 2006   Diabetic peripheral neuropathy ([place]) 04/26/2020   Fracture of left ankle 1997    Gastroparesis 07/2016   HOH (hard of hearing) 2004    Hyperlipidemia 2006   Hypertension 2006   IBS (irritable bowel syndrome) 2002    Leukopenia 2015    Macular degeneration 11/2019   Shingles 2009    Stroke [facility]) 2020   Thyroid nodule 2004    Past Surgical History:  Procedure Laterality Date   ABDOMINAL HYSTERECTOMY  2005   CATARACT EXTRACTION Left 11/2019   CESAREAN SECTION  1983    CHOLECYSTECTOMY N/A 03/05/2020   Procedure: LAPAROSCOPIC CHOLECYSTECTOMY WITH INTRAOPERATIVE CHOLANGIOGRAM;  Surgeon: Donnie Mesa, MD;  Location: [facility];  Service: General;  Laterality: N/A;   ESOPHAGOGASTRODUODENOSCOPY (EGD) WITH PROPOFOL Left 08/26/2014   Procedure: ESOPHAGOGASTRODUODENOSCOPY (EGD) WITH PROPOFOL;  Surgeon: Arta Silence, MD;  Location: [facility];  Service: Endoscopy;  Laterality: Left;   ESOPHAGOGASTRODUODENOSCOPY (EGD) WITH PROPOFOL N/A 03/03/2020   Procedure:  ESOPHAGOGASTRODUODENOSCOPY (EGD) WITH PROPOFOL;  Surgeon: Jerene Bears, MD;  Location: [facility];  Service: Gastroenterology;  Laterality: N/A;   RIGHT HEART CATH N/A 07/14/2021   Procedure: RIGHT HEART CATH;  Surgeon: Larey Dresser, MD;  Location: [place] [facility];  Service: Cardiovascular;  Laterality: N/A;   RIGHT/LEFT HEART CATH AND CORONARY ANGIOGRAPHY N/A 04/27/2021   Procedure: RIGHT/LEFT HEART CATH AND CORONARY ANGIOGRAPHY;  Surgeon: [name], MD;  Location: [place] [facility];  Service: Cardiovascular;  Laterality: N/A;    Allergies  Allergen Reactions   Elemental Sulfur Hives and Other (See Comments)    PATIENT STATED THIS, MORE THAN LIKELY, SHOULD HAVE BEEN LOGGED AS "SULFA"   Hydralazine Hcl Other (See Comments)    Hair loss   Hydrocodone Itching and Other (See Comments)    Upset stomach   Metformin And Related Nausea And Vomiting and Other (See Comments)    Stomach pains, also   Other Nausea Only and Other (See Comments)    Lettuce- Does not digest this!!   Plaquenil [Hydroxychloroquine Sulfate] Hives   Shellfish-Derived Products Nausea Only and Other (See Comments)    Caused an upset stomach   Shrimp (Diagnostic) Nausea Only and Other (See Comments)    Upset stomach    Sulfa Antibiotics Hives    Prior to Admission medications   Medication Sig Start Date End Date Taking? Authorizing Provider  acetaminophen (TYLENOL) 500 MG tablet Take 500 mg by mouth every 6 (six) hours as needed for moderate pain or headache.  Yes [provider]  amLODipine (NORVASC) 5 MG tablet Take 1 tablet (5 mg total) by mouth daily. 07/22/21  Yes Joette Catching, PA-C  aspirin EC 81 MG tablet Take 1 tablet (81 mg total) by mouth daily. 11/05/19  Yes [name], MD  atorvastatin (LIPITOR) 40 MG tablet Take 1 tablet (40 mg total) by mouth daily. 07/22/21  Yes Joette Catching, PA-C  carvedilol (COREG) 12.5 MG tablet Take 1 tablet (12.5 mg total) by mouth 2  (two) times daily with a meal. 07/21/21  Yes Joette Catching, PA-C  DULoxetine (CYMBALTA) 20 MG capsule TAKE 1 CAPSULE (20 MG TOTAL) BY MOUTH DAILY. 02/06/21 02/06/22 Yes Eulis Canner E, NP  ferrous sulfate 325 (65 FE) MG tablet Take 1 tablet (325 mg total) by mouth daily with breakfast. 08/01/21  Yes [name], MD  insulin glargine (LANTUS) 100 UNIT/ML Solostar Pen Inject 55 Units into the skin at bedtime. 06/30/21  Yes [name], MD  insulin lispro (HUMALOG KWIKPEN) 100 UNIT/ML KwikPen Inject 4 units before breakfast and dinner. Patient taking differently: Inject 4 Units into the skin every evening. 07/05/20  Yes Minette Brine, Amy J, NP  risperiDONE (RISPERDAL) 3 MG tablet TAKE 1 TABLET (3 MG TOTAL) BY MOUTH AT BEDTIME. 02/06/21 02/06/22 Yes Eulis Canner E, NP  torsemide (DEMADEX) 20 MG tablet Take 2 tablets (40 mg total) by mouth daily. 07/26/21  Yes Barrett, Evelene Croon, PA-C  Blood Glucose Monitoring Suppl (ONETOUCH VERIO) w/Device KIT Check blood sugar three times daily. E11.40 05/06/20   [name], MD  Blood Glucose Monitoring Suppl (TRUE METRIX METER) w/Device KIT Check blood sugar three times daily. E11.40 07/11/20   [name], MD  glucose blood test strip Use as instructedCheck blood sugar three times daily. E11.40 07/11/20   [name], MD  Insulin NPH Isophane & Regular (RELION 70/30 [place]) Inject 35 Units into the skin 2 (two) times daily.  08/17/14  [provider]    Social History   Socioeconomic History   Marital status: Legally Separated    Spouse name: [name]   Number of children: 1   Years of education: some colle   Highest education level: Not on file  Occupational History   Occupation: Employed FT as Landscape architect    Comment: [employer] , NA  Tobacco Use   Smoking status: Former    Packs/day: 0.25    Years: 0.50    Pack years: 0.13    Types: Cigarettes   Smokeless tobacco: Never   Tobacco comments:    quit yrs ago   Vaping Use   Vaping Use: Never used  Substance and Sexual Activity   Alcohol use: No   Drug use: No   Sexual activity: Yes    Birth control/protection: None  Other Topics Concern   Not on file  Social History Narrative   02/18/20 lives alone     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    Social Determinants of Health   Financial Resource Strain: High Risk   Difficulty of Paying Living Expenses: Hard  Food Insecurity: Food Insecurity Present   Worried About Charity fundraiser in the Last Year: Sometimes true   Ran Out of Food in the Last Year: Sometimes true  Transportation Needs: No Transportation Needs   Lack of Transportation (Medical): No   Lack of Transportation (Non-Medical): No  Physical Activity: Not on file  Stress: Not on file  Social Connections: Not on file  Intimate Partner Violence: Not on file     Family History  Problem Relation Age of Onset   Hypertension Mother    Heart disease Mother    Diabetes Mother    Thyroid disease Mother    Congestive Heart Failure Mother    Breast cancer Maternal Grandmother    Colon cancer Maternal Grandfather    Heart attack Sister    Heart disease Brother    Hyperlipidemia Brother    Hypertension Brother    Diabetes Father    Breast cancer Maternal Aunt     ROS: Otherwise negative unless mentioned in HPI  Physical Examination  Vitals:   09/05/21 2131 09/06/21 0751  BP: 123/89 140/62  Pulse: 76 72  Resp: 16 16  Temp: 100.3 F (37.9 C) 98.9 F (37.2 C)  SpO2: 99% 98%   Body mass index is 39.82 kg/m.  General:  WDWN in NAD Gait: Not observed HENT: WNL, normocephalic Pulmonary: normal non-labored breathing, without Rales, rhonchi,  wheezing Cardiac: regular Abdomen:  soft, NT/ND, no masses Skin: without rashes Vascular Exam/Pulses: no palpable pedal pulses Extremities: wounds pictured below Musculoskeletal: no muscle wasting or atrophy  Neurologic: A&O X 3;  No focal  weakness or paresthesias are detected; speech is fluent/normal Psychiatric:  The pt has Normal affect. Lymph:  Unremarkable  CBC    Component Value Date/Time   WBC 12.3 (H) 09/05/2021 0650   RBC 2.51 (L) 09/05/2021 0650   HGB 7.2 (L) 09/05/2021 0650   HGB 8.8 (L) 06/30/2021 1226   HGB 12.5 07/08/2008 1551   HCT 23.2 (L) 09/05/2021 0650   HCT 28.3 (L) 06/30/2021 1226   HCT 37.3 07/08/2008 1551   PLT 279 09/05/2021 0650   PLT 304 06/30/2021 1226   MCV 92.4 09/05/2021 0650   MCV 89 06/30/2021 1226   MCV 84.7 07/08/2008 1551   MCH 28.7 09/05/2021 0650   MCHC 31.0 09/05/2021 0650   RDW 14.6 09/05/2021 0650   RDW 13.6 06/30/2021 1226   RDW 13.0 07/08/2008 1551   LYMPHSABS 1.4 09/04/2021 1145   LYMPHSABS 2.5 02/23/2021 1523   LYMPHSABS 4.6 (H) 07/08/2008 1551   MONOABS 1.6 (H) 09/04/2021 1145   MONOABS 0.6 07/08/2008 1551   EOSABS 0.2 09/04/2021 1145   EOSABS 0.5 (H) 02/23/2021 1523   BASOSABS 0.1 09/04/2021 1145   BASOSABS 0.1 02/23/2021 1523   BASOSABS 0.1 07/08/2008 1551    BMET    Component Value Date/Time   NA 132 (L) 09/06/2021 0726   NA 139 08/01/2021 1031   K 4.4 09/06/2021 0726   CL 100 09/06/2021 0726   CO2 18 (L) 09/06/2021 0726   GLUCOSE 88 09/06/2021 0726   BUN 48 (H) 09/06/2021 0726   BUN 41 (H) 08/01/2021 1031   CREATININE 4.68 (H) 09/06/2021 0726   CREATININE 0.73 08/17/2014 1641   CALCIUM 8.3 (L) 09/06/2021 0726   GFRNONAA 10 (L) 09/06/2021 0726   GFRAA 58 (L) 05/06/2020 1207    COAGS: Lab Results  Component Value Date   INR 0.91 11/13/2017     Non-Invasive Vascular Imaging:   ABI/TBIToday's ABIToday's TBIPrevious ABIPrevious TBI  +-------+-----------+-----------+------------+------------+  Right  0.45                                            +-------+-----------+-----------+------------+------------+  Left   0.5  ASSESSMENT/PLAN: This is a 60 y.o. female with PAD and extensive L heel diabetic ulcer  - Patient  has an extensive L heel ulceration; agree with IV antibiotics - Evidence of arterial occlusive disease with L ABI of 0.5 with monophasic waveforms at the ankle - Patient also with CKD stage IV.  Angiography could potentially push patient into renal failure and initiation of HD.  Agree with daily painting of wounds with betadine as well as offloading heel for now.  Patient is aware she is at risk for limb loss and does not want to risk further kidney injury with angiography -On call vascular surgeon Dr. Trula Slade will evaluate the patient later today and provide further treatment plans   Dagoberto Ligas PA-C [facility] [phone]  I agree with the above.  I have seen and evaluated the patient.  Briefly, this is a 60 year old female with history of diabetes congestive heart failure and CKD 5 with creatinine greater than 4.  She states that she has a left wound on her heel that has been present for about a month.  She had ABIs which were 0.45 on the right and 0.5 on the left.  She has been on antibiotics.  MRI did not reveal osteomyelitis.  On exam, the wound is rather extensive on the heel and has a boggy appearance to it.  I discussed with the patient that she is at very high risk for a transtibial amputation.  We discussed the possibility of angiography however this would carry a significant risk for converting her into end-stage renal disease.  For now I would continue IV antibiotics and pressure offloading.  I have asked Dr. Sharol Given to evaluate her heel as a second opinion.  [name]

## 2021-09-06 NOTE — Plan of Care (Signed)
  Problem: Education: Goal: Knowledge of General Education information will improve Description: Including pain rating scale, medication(s)/side effects and non-pharmacologic comfort measures Outcome: Progressing   Problem: Health Behavior/Discharge Planning: Goal: Ability to manage health-related needs will improve Outcome: Progressing   Problem: Nutrition: Goal: Adequate nutrition will be maintained Outcome: Progressing   Problem: Coping: Goal: Level of anxiety will decrease Outcome: Progressing   Problem: Pain Managment: Goal: General experience of comfort will improve Outcome: Progressing   Problem: Safety: Goal: Ability to remain free from injury will improve Outcome: Progressing   Problem: Skin Integrity: Goal: Risk for impaired skin integrity will decrease Outcome: Progressing   

## 2021-09-06 NOTE — Progress Notes (Addendum)
Nancy [name] is a 60 year old female with a past medical history of HFpEF with a pertinent family history of CHF in both parents, CKD Stage IV, TIA, and Type II DM who presented with left foot pain and was admitted for a left heel ulcer secondary to diabetic neuropathy.  Subjective:  The patient said she was feeling fine this morning. She said she felt no pain. Her pain increased overnight to 8/10, and she required oxycodone and tylenol. She was able to ambulate once with a nurse's assistance to the restroom. She says the amount of urine produced was normal for her.   This afternoon, she said she was feeling a little "defeated". She was told her foot might have to be cut off by vascular, but she wants to keep it. She also said that she has had trouble seeing out of her left eye more than usual. She said she could not see the tv from the chair beside her bed with both of her eyes open. She also said she had an episode of palpitations this morning, but this resolved. She also said she has felt short of breath, but this has not been any different from baseline for her. She got out of bed to go to the bathroom and was able to walk. She has had no discomfort wearing the prevalon boots.   Her husband has come to visit her twice in the hospital, and over the phone, he expressed that he believes her mentation, ambulation, appetite, and bowel habits have been normal or improved from before he brought her to the ED. He was frustrated that the "blue hospital socks" had been twisted on her feet before she had fluid accumulation during her last hospitalization. He revealed that when he removed the socks after about a week, he noticed they had cut into the dorsal surfaces of her feet.  Objective:  Vital signs in last 24 hours: Vitals:   09/05/21 0755 09/05/21 1308 09/05/21 2131 09/06/21 0751  BP: (!) 156/68 (!) 105/54 123/89 140/62  Pulse: 67 70 76 72  Resp:  17 16 16   Temp: 98.6 F (37 C) 98.7 F  (37.1 C) 100.3 F (37.9 C) 98.9 F (37.2 C)  TempSrc: Oral Oral Oral Oral  SpO2: 100% 100% 99% 98%  Weight:      Height:       Weight change:   Intake/Output Summary (Last 24 hours) at 09/06/2021 1123 Last data filed at 09/06/2021 2947 Gross per 24 hour  Intake --  Output 420 ml  Net -420 ml   Physical Exam Vitals reviewed.  Constitutional:      General: She is not in acute distress.    Appearance: She is not ill-appearing.  HENT:     Head: Normocephalic and atraumatic.  Eyes:     Comments: Left ptosis  Cardiovascular:     Rate and Rhythm: Normal rate.  Pulmonary:     Effort: Pulmonary effort is normal.  Musculoskeletal:     Comments: Decreased BLE edema  Skin:    General: Skin is dry.     Comments: Feet in prevalon boots  Neurological:     General: No focal deficit present.     Mental Status: She is alert.  Psychiatric:        Thought Content: Thought content normal.     Comments: Slow to answer some questions    Assessment/Plan:  Active Problems:   Diabetic foot ulcer (HCC)   Cellulitis of left foot  Nancy [name] is a 60 year old female with a past medical history of HFpEF with a pertinent family history of CHF in both parents, CKD Stage IV, TIA, and Type II DM who presented with left foot pain and was admitted for a left heel ulcer secondary to diabetic neuropathy.  #Diabetic Foot Ulcer, heel of left foot #Diabetic Foot Ulcer, lateral foot #Left and Right Foot Skin Lacerations Patient has a diabetic foot ulcers likely due to diabetic neuropathy and is uncertain how long they have been present. She has had trouble walking due to pain. MRI showed no evidence of osteomyelitis, but showed signs of possible cellulitis and denervation changes. Per a conversation with her husband (09/21), the "blue hospital socks were twisted on her feet" before her legs had fluid accumulation. After about one week, her fluid level had increased, and when he removed the  socks, he noticed new lacerations on dorsum of both feet. He said he had noticed the ulcer but he did not know what it was and he feels a doctor should have told them about it so it could have been treated sooner. It is not clear if the wound was evaluated by another physician prior to podiatry evaluation and admission.  On 09/19, the patient was given one dose of Vancomycin 2000mg  and started on Zosyn. Yesterday, ABIs were completed, and the patient has moderate right and left arterial disease. Vascular has made the patient aware of the possibility of limb loss, and she does not want to lose her foot. She also does not want to risk further kidney injury with angiography. - Vascular surgery consulted, appreciate recommendations - Follow up blood cultures (NGTD) - Unasyn 3g in sodim chloride 0.9% 174mL IVPB (discontinued Zosyn 3.375g q8h after 5 total doses) - De-escalate antibiotics as appropriate pending culture results  - Oxycodone 6mg  tablet q6 PRN and tylenol 650mg  q6 PRN for pain - Wound care consulted, appreciate recommendations      - Twice daly iodine application to all wounds on both feet      - Prevalon heel lift boots   #Acute on chronic HFpEF exacerbation Patient presented with lower extremity edema concerning for acute on chronic HFpEF exacerbation, which has improved with lasix. Patient had echo 06/22 with EF of 55 to 60%.  - Completed IV lasix 40mg  X 2 doses - Continue coreg 12.5 mg BID - Hold home torsemide (30mg  daily) while getting IV lasix - Strict I/O's - Daily weight  #CKD Stage IV Patient has had decline in GFR in the past year with repeated insults of AKI. When she was diuresed recently in August, she experienced AKI because diuresis tipped the patient from euvolemic to slightly hypovolemic. With frequent volume overload, decline in kidney function could be related to a cardiorenal syndrome. Labs on 09/21 have introduced concerns about potential kidney injury. sCr is 4.68  today and it was 3.07 three weeks ago. - Hold home torsemide (30mg  daily) while getting IV lasix - Give 100cc/hr for 5 hours and see how the patient responds - Strict I/O's - Monitor BP - Monitor urine output   #Type II DM #Diabetic neuropathy Patient monitors her blood sugar once daily before meals, and her home sugars have been between 178-197. Most recent A1c is 6.8. Her husband says she has been eating well in the hospital. - Continue lantus and humalog + SSI - Continue home duloxetine 20 mg daily   #History of tactile hallucinations - Patient was continued on risperidone 3 mg nightly  Best Practice: Diet: Heart healthy diet IVF: None VTE: Heparin 5,000 units Code: Full AB: Unasyn 3g in sodim chloride 0.9% 150mL IVPB (discontinued Zosyn 3.375g q8h after 5 doses), one dose of Vancomycin 2000mg  on 09/19 Dispo: Admit patient to Inpatient with expected length of stay greater than 2 midnights.    LOS: 1 day   Danie Chandler, Medical Student 09/06/2021, 11:23 AM

## 2021-09-07 ENCOUNTER — Inpatient Hospital Stay (HOSPITAL_COMMUNITY): Payer: Medicaid Other

## 2021-09-07 DIAGNOSIS — N179 Acute kidney failure, unspecified: Secondary | ICD-10-CM

## 2021-09-07 DIAGNOSIS — I96 Gangrene, not elsewhere classified: Secondary | ICD-10-CM

## 2021-09-07 LAB — URINALYSIS, ROUTINE W REFLEX MICROSCOPIC
Bacteria, UA: NONE SEEN
Bilirubin Urine: NEGATIVE
Glucose, UA: NEGATIVE mg/dL
Ketones, ur: 5 mg/dL — AB
Leukocytes,Ua: NEGATIVE
Nitrite: NEGATIVE
Protein, ur: 100 mg/dL — AB
Specific Gravity, Urine: 1.021 (ref 1.005–1.030)
pH: 5 (ref 5.0–8.0)

## 2021-09-07 LAB — CBC
HCT: 28 % — ABNORMAL LOW (ref 36.0–46.0)
Hemoglobin: 8.4 g/dL — ABNORMAL LOW (ref 12.0–15.0)
MCH: 27.9 pg (ref 26.0–34.0)
MCHC: 30 g/dL (ref 30.0–36.0)
MCV: 93 fL (ref 80.0–100.0)
Platelets: 365 10*3/uL (ref 150–400)
RBC: 3.01 MIL/uL — ABNORMAL LOW (ref 3.87–5.11)
RDW: 15.1 % (ref 11.5–15.5)
WBC: 14.6 10*3/uL — ABNORMAL HIGH (ref 4.0–10.5)
nRBC: 0 % (ref 0.0–0.2)

## 2021-09-07 LAB — GLUCOSE, CAPILLARY
Glucose-Capillary: 124 mg/dL — ABNORMAL HIGH (ref 70–99)
Glucose-Capillary: 97 mg/dL (ref 70–99)
Glucose-Capillary: 135 mg/dL — ABNORMAL HIGH (ref 70–99)
Glucose-Capillary: 104 mg/dL — ABNORMAL HIGH (ref 70–99)

## 2021-09-07 LAB — BASIC METABOLIC PANEL
Anion gap: 13 (ref 5–15)
BUN: 51 mg/dL — ABNORMAL HIGH (ref 8–23)
CO2: 20 mmol/L — ABNORMAL LOW (ref 22–32)
Calcium: 8.7 mg/dL — ABNORMAL LOW (ref 8.9–10.3)
Chloride: 98 mmol/L (ref 98–111)
Creatinine, Ser: 5.2 mg/dL — ABNORMAL HIGH (ref 0.44–1.00)
GFR, Estimated: 9 mL/min — ABNORMAL LOW (ref >60)
Glucose, Bld: 164 mg/dL — ABNORMAL HIGH (ref 70–99)
Potassium: 4.8 mmol/L (ref 3.5–5.1)
Sodium: 131 mmol/L — ABNORMAL LOW (ref 135–145)

## 2021-09-07 LAB — MAGNESIUM: Magnesium: 2.6 mg/dL — ABNORMAL HIGH (ref 1.7–2.4)

## 2021-09-07 LAB — SODIUM, URINE, RANDOM: Sodium, Ur: <10 mmol/L

## 2021-09-07 MED ORDER — SODIUM CHLORIDE 0.9 % IV SOLN: INTRAVENOUS | Status: AC

## 2021-09-07 MED ORDER — SODIUM CHLORIDE 0.9 % IV SOLN
3.0000 g | Freq: Every day | INTRAVENOUS | Status: DC
  Filled 2021-09-07: qty 8

## 2021-09-07 MED ORDER — SODIUM CHLORIDE 0.9 % IV SOLN
100.0000 mg | Freq: Two times a day (BID) | INTRAVENOUS | Status: DC
  Administered 2021-09-07 – 2021-09-08 (×2): 100 mg via INTRAVENOUS
  Filled 2021-09-07 (×3): qty 100

## 2021-09-07 NOTE — Progress Notes (Signed)
  Progress Note    09/07/2021 9:16 AM * No surgery found *  Subjective:  no complaints. Denies any pain in left leg/ foot. Very overwhelmed with thinking about having to have amputation   Vitals:   09/06/21 2020 09/07/21 0805  BP: 116/68 137/83  Pulse: 63 72  Resp: 16 17  Temp: 98.5 F (36.9 C) 99 F (37.2 C)  SpO2: 98% 100%   Physical Exam: Cardiac:  regular Lungs:  non labored Extremities:  left heel and foot dressed. Malodorous. Prevalon offloading boot in place. No palpable distal pulses Neurologic: alert and oriented  CBC    Component Value Date/Time   WBC 14.6 (H) 09/07/2021 0058   RBC 3.01 (L) 09/07/2021 0058   HGB 8.4 (L) 09/07/2021 0058   HGB 8.8 (L) 06/30/2021 1226   HGB 12.5 07/08/2008 1551   HCT 28.0 (L) 09/07/2021 0058   HCT 28.3 (L) 06/30/2021 1226   HCT 37.3 07/08/2008 1551   PLT 365 09/07/2021 0058   PLT 304 06/30/2021 1226   MCV 93.0 09/07/2021 0058   MCV 89 06/30/2021 1226   MCV 84.7 07/08/2008 1551   MCH 27.9 09/07/2021 0058   MCHC 30.0 09/07/2021 0058   RDW 15.1 09/07/2021 0058   RDW 13.6 06/30/2021 1226   RDW 13.0 07/08/2008 1551   LYMPHSABS 1.4 09/04/2021 1145   LYMPHSABS 2.5 02/23/2021 1523   LYMPHSABS 4.6 (H) 07/08/2008 1551   MONOABS 1.6 (H) 09/04/2021 1145   MONOABS 0.6 07/08/2008 1551   EOSABS 0.2 09/04/2021 1145   EOSABS 0.5 (H) 02/23/2021 1523   BASOSABS 0.1 09/04/2021 1145   BASOSABS 0.1 02/23/2021 1523   BASOSABS 0.1 07/08/2008 1551    BMET    Component Value Date/Time   NA 131 (L) 09/07/2021 0058   NA 139 08/01/2021 1031   K 4.8 09/07/2021 0058   CL 98 09/07/2021 0058   CO2 20 (L) 09/07/2021 0058   GLUCOSE 164 (H) 09/07/2021 0058   BUN 51 (H) 09/07/2021 0058   BUN 41 (H) 08/01/2021 1031   CREATININE 5.20 (H) 09/07/2021 0058   CREATININE 0.73 08/17/2014 1641   CALCIUM 8.7 (L) 09/07/2021 0058   GFRNONAA 9 (L) 09/07/2021 0058   GFRAA 58 (L) 05/06/2020 1207    INR    Component Value Date/Time   INR 0.91  11/13/2017 0826     Intake/Output Summary (Last 24 hours) at 09/07/2021 0916 Last data filed at 09/06/2021 1700 Gross per 24 hour  Intake 634.91 ml  Output 120 ml  Net 514.91 ml     Assessment/Plan:  60 y.o. female with PAD and extensive left heel wound. Due to her CKD IV angiography is very high risk for progression to ESRD. It was discussed with her yesterday that she is at high risk for limb loss. Dr. Trula Slade asked Dr. Sharol Given to see patient for a second opinion. Dr. Sharol Given has seen and evaluated patient and agrees that she best would be managed with left BKA due to extensiveness of her heel wound. She is presently not ready to make decision regarding amputation and would like to discuss this further with her family. Continue IV Abx and pressure offloading for now.  Karoline Caldwell, PA-C [facility] [phone] 09/07/2021 9:16 AM

## 2021-09-07 NOTE — Progress Notes (Addendum)
Nancy [name] is a 60 year old female with a past medical history of HFpEF with a pertinent family history of CHF in both parents, CKD Stage IV, TIA, and Type II DM who presented with left foot pain and was admitted for a left heel ulcer secondary to diabetic neuropathy.  Subjective:  The patient said she is feeling alright today. She is having no foot pain but needed oxycodone and tylenol last night. She is having no chest pain and no shortness of breath. She had a bowel movement yesterday and has been able to urinate. She said she had not talked to any other doctors about an updated plan earlier this morning.   Objective:  Vital signs in last 24 hours: Vitals:   09/06/21 0751 09/06/21 2020 09/07/21 0805 09/07/21 1304  BP: 140/62 116/68 137/83 (!) 142/66  Pulse: 72 63 72 67  Resp: 16 16 17 17   Temp: 98.9 F (37.2 C) 98.5 F (36.9 C) 99 F (37.2 C) 98.6 F (37 C)  TempSrc: Oral Oral Oral Oral  SpO2: 98% 98% 100% 100%  Weight:      Height:       Weight change:   Intake/Output Summary (Last 24 hours) at 09/07/2021 1459 Last data filed at 09/07/2021 1100 Gross per 24 hour  Intake 1778.46 ml  Output --  Net 1778.46 ml   Physical Exam Vitals reviewed.  Constitutional:      Appearance: Normal appearance.  HENT:     Head: Normocephalic and atraumatic.  Eyes:     Comments: Left ptosis  Cardiovascular:     Rate and Rhythm: Normal rate and regular rhythm.  Pulmonary:     Effort: Pulmonary effort is normal.     Comments: The patient was too weak to sit up in bed, so we could not listen Abdominal:     Palpations: Abdomen is soft.  Skin:    General: Skin is warm and dry.     Comments: Prevalon boots  Neurological:     Mental Status: She is alert and oriented to person, place, and time.     Motor: Weakness present.  Psychiatric:        Mood and Affect: Mood normal.     Comments: Slow to respond to questions    Assessment/Plan:  Active Problems:   Diabetic  foot ulcer (HCC)   Cellulitis of left foot   Gangrene of left foot [facility])  [name] is a 60 year old female with a past medical history of HFpEF with a pertinent family history of CHF in both parents, CKD Stage IV, TIA, and Type II DM who presented with left foot pain and was admitted for a left heel ulcer secondary to diabetic neuropathy.  #Diabetic Foot Ulcer, heel of left foot #Diabetic Foot Ulcer, lateral foot #Left and Right Foot Skin Lacerations #PAD Patient has a diabetic foot ulcers likely due to diabetic neuropathy and is uncertain how long they have been present. She has had trouble walking due to pain. MRI showed no evidence of osteomyelitis, but showed signs of possible cellulitis and denervation changes. Per a conversation with her husband (09/21), the "blue hospital socks were twisted on her feet" before her legs had fluid accumulation. After about one week, her fluid level had increased, and when he removed the socks, he noticed new lacerations on dorsum of both feet. It is unclear if the heel ulcer was evaluated by another physician prior to podiatry evaluation and admission. On 09/19, the patient was given  one dose of Vancomycin 2000mg  and started on Zosyn. ABIs completed, and the patient has moderate right and left arterial disease. Vascular surgery made the patient aware of the possibility of limb loss, and she does not want to lose her foot. She also does not want to risk further kidney injury with angiography. Ortho also provided an assessment and recommends the patient proceed with transtibial amputation on the left because there are no foot salvage intervention options, even if vascular could improve her circulation. Surgery could be performed this week pending family decision. - Vascular surgery following, appreciate recommendations - Orthopedics consulted, appreciate recommendations - Vascular surgery and orthopedics are recommending transtibial amputation on the  Left.  Patient will consider surgical intervention.  Orthopedics could proceed with surgery tomorrow, Friday. - Follow up blood cultures (3 days no growth) -Day 4 of antibiotics: Currently on doxycycline and Unasyn - De-escalate antibiotics as appropriate pending culture results  - Oxycodone 6mg  tablet q6 PRN and tylenol 650mg  q6 PRN for pain - Wound care consulted, appreciate recommendations      - Twice daly iodine application to all wounds on both feet      - Prevalon heel lift boots   #Acute on chronic HFpEF exacerbation Patient presented with lower extremity edema concerning for acute on chronic HFpEF exacerbation, which has improved with lasix. Patient had echo 06/22 with EF of 55 to 60%.  - s/p IV lasix 40mg  X 2 doses - Continue coreg 12.5 mg BID - Hold home torsemide (30mg  daily) - Strict I/O's - Daily weight   #AKI on CKD Stage IV Patient has had decline in GFR in the past year with repeated episodes of AKI. With frequent volume overload 2/2 HF, decline in kidney function could be related to cardiorenal syndrome.  BUN 51 sCr 5.2, GFR 9 today. BUN:Cr ratio of 10:1 make [place] concerned for ATN. Low urine output, but unsure if I/O's have been strict. Concern for oliguric renal failure.  Patient also received dose of Vanc on admission. -Nephrology consulted, appreciate recs - Gentle fluids (100cc/hr for 3 hours) - F/U repeat BMP this afternoon - F/U urinalysis - F/U urine Na -F/U renal ultrasound - F/U PTH - Strict I/O's - Avoid contrast to kidney or nephrotoxic agents   #Type II DM #Diabetic neuropathy Patient monitors her blood sugar once daily before meals, and her home sugars have been between 178-197. Most recent A1c is 6.8. Her husband says she has been eating well in the hospital.  Blood sugars appropriate this admission. - Continue lantus and humalog + SSI - Continue home duloxetine 20 mg daily   #History of tactile hallucinations - Patient was continued on risperidone 3  mg nightly  #PAD #HTN Patient was continued on aspirin 81 mg daily, Lipitor 40 mg daily.  Patient's blood pressure medications: Amlodipine 5 mg daily, carvedilol 12.5 mg twice daily, were continued.  Locations continue this admission: Cymbalta 20 mg daily   Best Practice: Diet: Heart healthy diet IVF: None VTE: Heparin 5,000 units Code: Full AB: Unasyn 3g in sodim chloride 0.9% 120mL IVPB (discontinued Zosyn 3.375g q8h after 5 doses), one dose of Vancomycin 2000mg  on 09/19 Dispo: Admit patient to Inpatient with expected length of stay greater than 2 midnights.   LOS: 2 days   Danie Chandler, Medical Student 09/07/2021, 2:59 PM  Attestation for Student Documentation:  I personally was present and performed or re-performed the history, physical exam and medical decision-making activities of this service and have verified that the  service and findings are accurately documented in the student's note.  Wayland Denis, MD 09/07/2021, 4:29 PM

## 2021-09-07 NOTE — Evaluation (Signed)
Occupational Therapy Evaluation Patient Details Name: Nancy [name] MRN: [id number] DOB: 1961/05/21 Today's Date: 09/07/2021   History of Present Illness 60 y.o. female admitted on 9/19 with necrotic L heel.  MRI (+) for ulceration with possible cellulitis/myositis, (-) for OM.  Recent admission x 3 weeks ago for CHF; per pt, poor fitting hospital socks caused initial foot wound. Possilbe BKA to L friday. PMH includes anemia, depression, DM, peripheral neuropathy, HLD, HTN, CVA, CHF, HOH, CKD   Clinical Impression   PTA patient reports using cane for mobility and assist required for LB ADLs since LE edema, prior independent with ADLs. Admitted for above and presenting with problem list below, including decreased activity tolerance, B LE edema, abdominal/ LE pain, generalized weakness, impaired balance and decreased activity tolerance.  Patient requires mod assist for bed mobility, min guard for limited sitting tolerance at EOB, and up to total assist for ADLs.  She is oriented today, follows simple commands inconsistently and requires multimodal cueing with greatly increased time, poor awareness.  She will benefit from continued OT services while admitted and after dc at SNF level to optimize independence, safety, and return to PLOF. Will follow.      Recommendations for follow up therapy are one component of a multi-disciplinary discharge planning process, led by the attending physician.  Recommendations may be updated based on patient status, additional functional criteria and insurance authorization.   Follow Up Recommendations  SNF;Supervision/Assistance - 24 hour    Equipment Recommendations  3 in 1 bedside commode    Recommendations for Other Services Other (comment) (palliative care)     Precautions / Restrictions Precautions Precautions: Fall Precaution Comments: no WB orders but per notes "Offload" L LE Required Braces or Orthoses: Other Brace Other Brace: B prevelon  boots Restrictions Weight Bearing Restrictions: Yes Other Position/Activity Restrictions: no WB orders in chart, per notes "offload" L LE      Mobility Bed Mobility Overal bed mobility: Needs Assistance Bed Mobility: Supine to Sit;Sit to Supine     Supine to sit: Mod assist Sit to supine: Mod assist   General bed mobility comments: HOB elevated with mod assist to elevate trunk and scoot hips forward, multiple trials required as pt reports "i'm giving out".  Returned to supine with support to raise BLEs back to supine    Transfers                 General transfer comment: deferred due to fatigue, weakness    Balance Overall balance assessment: Needs assistance Sitting-balance support: No upper extremity supported;Feet supported Sitting balance-Leahy Scale: Poor Sitting balance - Comments: min guard for safety with limited tolerance due to fatigue                                   ADL either performed or assessed with clinical judgement   ADL Overall ADL's : Needs assistance/impaired     Grooming: Minimal assistance;Sitting           Upper Body Dressing : Moderate assistance;Sitting   Lower Body Dressing: Total assistance;Sitting/lateral leans;Bed level     Toilet Transfer Details (indicate cue type and reason): deferred Toileting- Clothing Manipulation and Hygiene: Total assistance;Bed level Toileting - Clothing Manipulation Details (indicate cue type and reason): bed level hygiene     Functional mobility during ADLs: Moderate assistance;Cueing for sequencing General ADL Comments: pt limited by weakness, decreased activity tolerance, pain  Vision Patient Visual Report: No change from baseline Vision Assessment?: No apparent visual deficits Additional Comments: requires cueing to maintain eyes open during session     Perception     Praxis      Pertinent Vitals/Pain Pain Assessment: Faces Faces Pain Scale: Hurts a little bit Pain  Location: stomach Pain Descriptors / Indicators: Discomfort Pain Intervention(s): Limited activity within patient's tolerance;Monitored during session;Repositioned     Hand Dominance Right   Extremity/Trunk Assessment Upper Extremity Assessment Upper Extremity Assessment: Generalized weakness   Lower Extremity Assessment Lower Extremity Assessment: Defer to PT evaluation       Communication Communication Communication: No difficulties   Cognition Arousal/Alertness: Lethargic Behavior During Therapy: Flat affect Overall Cognitive Status: Impaired/Different from baseline Area of Impairment: Problem solving;Awareness;Safety/judgement;Following commands;Attention                   Current Attention Level: Focused   Following Commands: Follows one step commands inconsistently;Follows one step commands with increased time Safety/Judgement: Decreased awareness of safety;Decreased awareness of deficits Awareness: Emergent Problem Solving: Slow processing;Difficulty sequencing;Requires verbal cues;Decreased initiation;Requires tactile cues General Comments: pt oriented today, requires increased time to follow simple commands with inconsistent response and poor awareness to deficits   General Comments       Exercises     Shoulder Instructions      Home Living Family/patient expects to be discharged to:: Private residence Living Arrangements: Spouse/significant other Available Help at Discharge: Family ("most of the time") Type of Home: House Home Access: Level entry     Home Layout: One level     Bathroom Shower/Tub: Teacher, early years/pre: Handicapped height     Home Equipment: Environmental consultant - 2 wheels;Cane - single point;Bedside commode;Shower seat          Prior Functioning/Environment Level of Independence: Needs assistance  Gait / Transfers Assistance Needed: reports using cane for mobility at home ADL's / Homemaking Assistance Needed: spouse  assists with LB dressing/bathing since leg swelling started, independent to restroom; completed IADLs prior to leg swelling            OT Problem List: Decreased strength;Decreased activity tolerance;Impaired balance (sitting and/or standing);Decreased coordination;Decreased safety awareness;Decreased knowledge of use of DME or AE;Decreased knowledge of precautions;Decreased cognition;Pain;Obesity;Increased edema      OT Treatment/Interventions: Self-care/ADL training;Therapeutic exercise;DME and/or AE instruction;Therapeutic activities;Patient/family education;Balance training;Energy conservation;Cognitive remediation/compensation    OT Goals(Current goals can be found in the care plan section) Acute Rehab OT Goals Patient Stated Goal: to lay back down OT Goal Formulation: With patient Time For Goal Achievement: 09/21/21 Potential to Achieve Goals: Good  OT Frequency: Min 2X/week   Barriers to D/C:            Co-evaluation              AM-PAC OT "6 Clicks" Daily Activity     Outcome Measure Help from another person eating meals?: A Little Help from another person taking care of personal grooming?: A Little Help from another person toileting, which includes using toliet, bedpan, or urinal?: Total Help from another person bathing (including washing, rinsing, drying)?: A Lot Help from another person to put on and taking off regular upper body clothing?: A Lot Help from another person to put on and taking off regular lower body clothing?: Total 6 Click Score: 12   End of Session Nurse Communication: Mobility status  Activity Tolerance: Patient limited by lethargy Patient left: in bed;with call bell/phone within reach;with bed alarm set  OT Visit Diagnosis: Other abnormalities of gait and mobility (R26.89);Muscle weakness (generalized) (M62.81);Pain;Other symptoms and signs involving cognitive function Pain - part of body:  (abdomen)                Time: 1558-2833 OT  Time Calculation (min): 24 min Charges:  OT General Charges $OT Visit: 1 Visit OT Evaluation $OT Eval Moderate Complexity: 1 Mod OT Treatments $Self Care/Home Management : 8-22 mins  Jolaine Artist, OT Acute Rehabilitation Services Pager [phone] Office [phone]   Delight Stare 09/07/2021, 10:59 AM

## 2021-09-07 NOTE — Consult Note (Signed)
ORTHOPAEDIC CONSULTATION  REQUESTING PHYSICIAN: Sid Falcon, MD  Chief Complaint: Dry gangrene left heel with severe peripheral vascular disease.  HPI: Nancy [name] is a 60 y.o. female who presents with wet gangrenous ulcer involving the entire left heel pad..  Patient has diabetes with peripheral vascular disease.,  With wet gangrenous ulcer of the left heel currently with bacteremia from the infection.  Past Medical History:  Diagnosis Date   Anemia 2006   Depression 2014   previously on amitryptiline    Diabetes mellitus with neurological manifestation ([place]) 2006   Diabetic peripheral neuropathy ([place]) 04/26/2020   Fracture of left ankle 1997    Gastroparesis 07/2016   HOH (hard of hearing) 2004    Hyperlipidemia 2006   Hypertension 2006   IBS (irritable bowel syndrome) 2002    Leukopenia 2015    Macular degeneration 11/2019   Shingles 2009    Stroke [facility]) 2020   Thyroid nodule 2004   Past Surgical History:  Procedure Laterality Date   ABDOMINAL HYSTERECTOMY  2005   CATARACT EXTRACTION Left 11/2019   CESAREAN SECTION  1983    CHOLECYSTECTOMY N/A 03/05/2020   Procedure: LAPAROSCOPIC CHOLECYSTECTOMY WITH INTRAOPERATIVE CHOLANGIOGRAM;  Surgeon: Donnie Mesa, MD;  Location: [facility];  Service: General;  Laterality: N/A;   ESOPHAGOGASTRODUODENOSCOPY (EGD) WITH PROPOFOL Left 08/26/2014   Procedure: ESOPHAGOGASTRODUODENOSCOPY (EGD) WITH PROPOFOL;  Surgeon: Arta Silence, MD;  Location: [facility];  Service: Endoscopy;  Laterality: Left;   ESOPHAGOGASTRODUODENOSCOPY (EGD) WITH PROPOFOL N/A 03/03/2020   Procedure: ESOPHAGOGASTRODUODENOSCOPY (EGD) WITH PROPOFOL;  Surgeon: Jerene Bears, MD;  Location: [facility];  Service: Gastroenterology;  Laterality: N/A;   RIGHT HEART CATH N/A 07/14/2021   Procedure: RIGHT HEART CATH;  Surgeon: Larey Dresser, MD;  Location: [place] [facility];  Service: Cardiovascular;  Laterality: N/A;   RIGHT/LEFT HEART CATH AND CORONARY  ANGIOGRAPHY N/A 04/27/2021   Procedure: RIGHT/LEFT HEART CATH AND CORONARY ANGIOGRAPHY;  Surgeon: [name], MD;  Location: [place] [facility];  Service: Cardiovascular;  Laterality: N/A;   Social History   Socioeconomic History   Marital status: Legally Separated    Spouse name: [name]   Number of children: 1   Years of education: some colle   Highest education level: Not on file  Occupational History   Occupation: Employed FT as Landscape architect    Comment: [employer] , [place]  Tobacco Use   Smoking status: Former    Packs/day: 0.25    Years: 0.50    Pack years: 0.13    Types: Cigarettes   Smokeless tobacco: Never   Tobacco comments:    quit yrs ago  Vaping Use   Vaping Use: Never used  Substance and Sexual Activity   Alcohol use: No   Drug use: No   Sexual activity: Yes    Birth control/protection: None  Other Topics Concern   Not on file  Social History Narrative   02/18/20 lives alone     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    Social Determinants of Health   Financial Resource Strain: High Risk   Difficulty of Paying Living Expenses: Hard  Food Insecurity: Food Insecurity Present   Worried About Charity fundraiser in the Last Year: Sometimes true   Ran Out of Food in the Last Year: Sometimes true  Transportation Needs: No Transportation Needs   Lack of Transportation (Medical): No   Lack of Transportation (Non-Medical): No  Physical Activity:  Not on file  Stress: Not on file  Social Connections: Not on file   Family History  Problem Relation Age of Onset   Hypertension Mother    Heart disease Mother    Diabetes Mother    Thyroid disease Mother    Congestive Heart Failure Mother    Breast cancer Maternal Grandmother    Colon cancer Maternal Grandfather    Heart attack Sister    Heart disease Brother    Hyperlipidemia Brother    Hypertension Brother    Diabetes Father    Breast cancer Maternal Aunt    - negative  except otherwise stated in the family history section Allergies  Allergen Reactions   Elemental Sulfur Hives and Other (See Comments)    PATIENT STATED THIS, MORE THAN LIKELY, SHOULD HAVE BEEN LOGGED AS "SULFA"   Hydralazine Hcl Other (See Comments)    Hair loss   Hydrocodone Itching and Other (See Comments)    Upset stomach   Metformin And Related Nausea And Vomiting and Other (See Comments)    Stomach pains, also   Other Nausea Only and Other (See Comments)    Lettuce- Does not digest this!!   Plaquenil [Hydroxychloroquine Sulfate] Hives   Shellfish-Derived Products Nausea Only and Other (See Comments)    Caused an upset stomach   Shrimp (Diagnostic) Nausea Only and Other (See Comments)    Upset stomach    Sulfa Antibiotics Hives   Prior to Admission medications   Medication Sig Start Date End Date Taking? Authorizing Provider  acetaminophen (TYLENOL) 500 MG tablet Take 500 mg by mouth every 6 (six) hours as needed for moderate pain or headache.   Yes [provider]  amLODipine (NORVASC) 5 MG tablet Take 1 tablet (5 mg total) by mouth daily. 07/22/21  Yes Joette Catching, PA-C  aspirin EC 81 MG tablet Take 1 tablet (81 mg total) by mouth daily. 11/05/19  Yes [name], MD  atorvastatin (LIPITOR) 40 MG tablet Take 1 tablet (40 mg total) by mouth daily. 07/22/21  Yes Joette Catching, PA-C  carvedilol (COREG) 12.5 MG tablet Take 1 tablet (12.5 mg total) by mouth 2 (two) times daily with a meal. 07/21/21  Yes Joette Catching, PA-C  DULoxetine (CYMBALTA) 20 MG capsule TAKE 1 CAPSULE (20 MG TOTAL) BY MOUTH DAILY. 02/06/21 02/06/22 Yes Eulis Canner E, NP  ferrous sulfate 325 (65 FE) MG tablet Take 1 tablet (325 mg total) by mouth daily with breakfast. 08/01/21  Yes [name], MD  insulin glargine (LANTUS) 100 UNIT/ML Solostar Pen Inject 55 Units into the skin at bedtime. 06/30/21  Yes [name], MD  insulin lispro (HUMALOG KWIKPEN) 100  UNIT/ML KwikPen Inject 4 units before breakfast and dinner. Patient taking differently: Inject 4 Units into the skin every evening. 07/05/20  Yes Minette Brine, Amy J, NP  risperiDONE (RISPERDAL) 3 MG tablet TAKE 1 TABLET (3 MG TOTAL) BY MOUTH AT BEDTIME. 02/06/21 02/06/22 Yes Eulis Canner E, NP  torsemide (DEMADEX) 20 MG tablet Take 2 tablets (40 mg total) by mouth daily. 07/26/21  Yes Barrett, Evelene Croon, PA-C  Blood Glucose Monitoring Suppl (ONETOUCH VERIO) w/Device KIT Check blood sugar three times daily. E11.40 05/06/20   [name], MD  Blood Glucose Monitoring Suppl (TRUE METRIX METER) w/Device KIT Check blood sugar three times daily. E11.40 07/11/20   [name], MD  glucose blood test strip Use as instructedCheck blood sugar three times daily. E11.40 07/11/20   Wynetta Emery,  Dalbert Batman, MD  Insulin NPH Isophane & Regular (RELION 70/30 [place]) Inject 35 Units into the skin 2 (two) times daily.  08/17/14  [provider]   VAS [place] ABI WITH/WO TBI  Result Date: 09/05/2021  LOWER EXTREMITY DOPPLER STUDY Patient Name:  Nancy [name]  Date of Exam:   09/05/2021 Medical Rec #: [id number]            Accession #:    [id number] Date of Birth: 05-14-1961            Patient Gender: F Patient Age:   31 years Exam Location:  [facility] Procedure:      VAS [place] ABI WITH/WO TBI Referring Phys: Madalyn Rob --------------------------------------------------------------------------------  Indications: Ulceration. High Risk Factors: Hypertension, hyperlipidemia, Diabetes.  Limitations: Today's exam was limited due to patient positioning, an open wound,              bandages , involuntary patient movement and poor patient              cooperation. Performing Technologist: Carlos Levering RVT  Examination Guidelines: A complete evaluation includes at minimum, Doppler waveform signals and systolic blood pressure reading at the level of bilateral brachial, anterior tibial, and posterior tibial arteries,  when vessel segments are accessible. Bilateral testing is considered an integral part of a complete examination. Photoelectric Plethysmograph (PPG) waveforms and toe systolic pressure readings are included as required and additional duplex testing as needed. Limited examinations for reoccurring indications may be performed as noted.  ABI Findings: +--------+------------------+-----+----------+--------+ Right   Rt Pressure (mmHg)IndexWaveform  Comment  +--------+------------------+-----+----------+--------+ [id number]                    triphasic          +--------+------------------+-----+----------+--------+ PTA     72                0.45 monophasic         +--------+------------------+-----+----------+--------+ DP      64                0.40 monophasic         +--------+------------------+-----+----------+--------+ +--------+------------------+-----+----------+-------+ Left    Lt Pressure (mmHg)IndexWaveform  Comment +--------+------------------+-----+----------+-------+ [id number]                    triphasic         +--------+------------------+-----+----------+-------+ PTA     74                0.46 monophasic        +--------+------------------+-----+----------+-------+ DP      81                0.50 monophasic        +--------+------------------+-----+----------+-------+ +-------+-----------+-----------+------------+------------+ ABI/TBIToday's ABIToday's TBIPrevious ABIPrevious TBI +-------+-----------+-----------+------------+------------+ Right  0.45                                           +-------+-----------+-----------+------------+------------+ Left   0.5                                            +-------+-----------+-----------+------------+------------+  Summary: Right: Resting right ankle-brachial index indicates moderate right lower extremity arterial disease. Unable to obtain TBI due to constant patient movement. Left:  Resting  left ankle-brachial index indicates moderate left lower extremity arterial disease. Unable to obtain TBI due to constant patient movement.  *See table(s) above for measurements and observations.  Electronically signed by [name] MD on 09/05/2021 at 6:54:53 PM.    Final    - pertinent xrays, CT, MRI studies were reviewed and independently interpreted  Positive ROS: All other systems have been reviewed and were otherwise negative with the exception of those mentioned in the HPI and as above.  Physical Exam: General: Alert, no acute distress Psychiatric: Patient is competent for consent with normal mood and affect Lymphatic: No axillary or cervical lymphadenopathy Cardiovascular: No pedal edema Respiratory: No cyanosis, no use of accessory musculature GI: No organomegaly, abdomen is soft and non-tender    Images:  _0 @  Labs:  Lab Results  Component Value Date   HGBA1C 6.8 (H) 09/04/2021   HGBA1C QNSTST 07/09/2021   HGBA1C 7.1 (H) 05/10/2021   REPTSTATUS PENDING 09/04/2021   CULT  09/04/2021    NO GROWTH 2 DAYS Performed at [facility], [place] [address] [place], [place] [address]     Lab Results  Component Value Date   ALBUMIN 2.5 (L) 09/05/2021   ALBUMIN 3.0 (L) 08/12/2021   ALBUMIN 3.2 (L) 07/22/2021     CBC EXTENDED Latest Ref Rng & Units 09/07/2021 09/05/2021 09/04/2021  WBC 4.0 - 10.5 K/uL 14.6(H) 12.3(H) 17.1(H)  RBC 3.87 - 5.11 MIL/uL 3.01(L) 2.51(L) 2.95(L)  HGB 12.0 - 15.0 g/dL 8.4(L) 7.2(L) 8.2(L)  HCT 36.0 - 46.0 % 28.0(L) 23.2(L) 27.1(L)  PLT 150 - 400 K/uL 365 279 317  NEUTROABS 1.7 - 7.7 K/uL - - 13.8(H)  LYMPHSABS 0.7 - 4.0 K/uL - - 1.4    Neurologic: Patient does not have protective sensation bilateral lower extremities.   MUSCULOSKELETAL:   Skin: Examination both feet are thin and atrophic there are superficial ulcers on the right lower extremity.  Examination of the left lower extremity she does not have palpable pulses she  has a wet full-thickness gangrenous ulcer of the heel pad that extends down to bone.  There is also an ischemic ulcer over the dorsum of the midfoot.  Ankle-brachial indices shows monophasic flow at the ankle with absent toe pressures and significant diminished ankle-brachial indices.  MRI scan shows ulcerative tissue that extends down to the calcaneus.  There is no abscess no bony changes consistent with osteomyelitis.  Radiographs are unremarkable.  Assessment: Assessment: Diabetic insensate neuropathy with significant peripheral vascular disease with right gangrenous ulceration of the entire left heel pad with ischemic changes to the right foot as well.  Plan: Patient does not have foot salvage intervention options even with improved circulation.  I recommend the patient proceed with a transtibial amputation on the left.  Patient states she is not ready to make this decision at this time.  I discussed with her that we could proceed with surgery tomorrow Friday.  I will proceed when patient agrees to surgical intervention.  Thank you for the consult and the opportunity to see Ms. Leana Roe, MD [facility] [phone] 8:38 AM

## 2021-09-08 ENCOUNTER — Inpatient Hospital Stay (HOSPITAL_COMMUNITY): Payer: Self-pay

## 2021-09-08 LAB — BASIC METABOLIC PANEL
Anion gap: 13 (ref 5–15)
BUN: 57 mg/dL — ABNORMAL HIGH (ref 8–23)
CO2: 18 mmol/L — ABNORMAL LOW (ref 22–32)
Calcium: 8.2 mg/dL — ABNORMAL LOW (ref 8.9–10.3)
Chloride: 101 mmol/L (ref 98–111)
Creatinine, Ser: 5.99 mg/dL — ABNORMAL HIGH (ref 0.44–1.00)
GFR, Estimated: 7 mL/min — ABNORMAL LOW (ref >60)
Glucose, Bld: 89 mg/dL (ref 70–99)
Potassium: 4.7 mmol/L (ref 3.5–5.1)
Sodium: 132 mmol/L — ABNORMAL LOW (ref 135–145)

## 2021-09-08 LAB — CBC
HCT: 23 % — ABNORMAL LOW (ref 36.0–46.0)
Hemoglobin: 6.9 g/dL — CL (ref 12.0–15.0)
MCH: 28 pg (ref 26.0–34.0)
MCHC: 30 g/dL (ref 30.0–36.0)
MCV: 93.5 fL (ref 80.0–100.0)
Platelets: 306 10*3/uL (ref 150–400)
RBC: 2.46 MIL/uL — ABNORMAL LOW (ref 3.87–5.11)
RDW: 15.5 % (ref 11.5–15.5)
WBC: 13.9 10*3/uL — ABNORMAL HIGH (ref 4.0–10.5)
nRBC: 0 % (ref 0.0–0.2)

## 2021-09-08 LAB — PTH, INTACT AND CALCIUM
Calcium, Total (PTH): 8.3 mg/dL — ABNORMAL LOW (ref 8.7–10.3)
PTH: 113 pg/mL — ABNORMAL HIGH (ref 15–65)

## 2021-09-08 LAB — HEMOGLOBIN AND HEMATOCRIT, BLOOD
HCT: 27.1 % — ABNORMAL LOW (ref 36.0–46.0)
Hemoglobin: 8 g/dL — ABNORMAL LOW (ref 12.0–15.0)

## 2021-09-08 LAB — GLUCOSE, CAPILLARY
Glucose-Capillary: 79 mg/dL (ref 70–99)
Glucose-Capillary: 82 mg/dL (ref 70–99)
Glucose-Capillary: 144 mg/dL — ABNORMAL HIGH (ref 70–99)
Glucose-Capillary: 144 mg/dL — ABNORMAL HIGH (ref 70–99)
Glucose-Capillary: 168 mg/dL — ABNORMAL HIGH (ref 70–99)

## 2021-09-08 LAB — PREPARE RBC (CROSSMATCH)

## 2021-09-08 LAB — PHOSPHORUS: Phosphorus: 6.7 mg/dL — ABNORMAL HIGH (ref 2.5–4.6)

## 2021-09-08 MED ORDER — DARBEPOETIN ALFA 40 MCG/0.4ML IJ SOSY
40.0000 ug | PREFILLED_SYRINGE | INTRAMUSCULAR | Status: DC
  Administered 2021-09-08: 40 ug via SUBCUTANEOUS
  Filled 2021-09-08: qty 0.4

## 2021-09-08 MED ORDER — AMOXICILLIN-POT CLAVULANATE 250-125 MG PO TABS
1.0000 | ORAL_TABLET | Freq: Two times a day (BID) | ORAL | Status: DC
  Administered 2021-09-08 – 2021-09-21 (×27): 1 via ORAL
  Filled 2021-09-08 (×28): qty 1

## 2021-09-08 MED ORDER — TORSEMIDE 20 MG PO TABS
20.0000 mg | ORAL_TABLET | Freq: Two times a day (BID) | ORAL | Status: DC
  Administered 2021-09-08 – 2021-09-21 (×27): 20 mg via ORAL
  Filled 2021-09-08 (×29): qty 1

## 2021-09-08 MED ORDER — SODIUM CHLORIDE 0.9% IV SOLUTION: Freq: Once | INTRAVENOUS | Status: AC

## 2021-09-08 MED ORDER — DOXYCYCLINE HYCLATE 100 MG PO TABS
100.0000 mg | ORAL_TABLET | Freq: Two times a day (BID) | ORAL | Status: DC
  Administered 2021-09-08 – 2021-09-21 (×27): 100 mg via ORAL
  Filled 2021-09-08 (×27): qty 1

## 2021-09-08 MED ORDER — POLYSACCHARIDE IRON COMPLEX 150 MG PO CAPS
150.0000 mg | ORAL_CAPSULE | ORAL | Status: DC
  Administered 2021-09-08 – 2021-09-20 (×13): 150 mg via ORAL
  Filled 2021-09-08 (×14): qty 1

## 2021-09-08 NOTE — Consult Note (Addendum)
[facility] Renal Consultation Note  Requesting MD:  Indication for Consultation: Acute on chronic kidney disease, maintenance euvolemia, assessment treatment of electrolyte and acid-base abnormalities, assessment treatment of anemia of chronic disease.  HPI:  Nancy [name] is a 60 y.o. female.  Who has a history of chronic kidney disease stage IV, history of TIA type 2 diabetes and diastolic heart failure.  She presented with left heel ulcer secondary diabetic neuropathy.  She appears to have a history of chronic kidney disease.  With a creatinine in August of 4 mg/dL.  Renal ultrasound 09/07/2021 did not show any evidence of hydronephrosis.  Urinalysis shows no evidence of red blood cells white blood cells and has 100 mg/dL protein.  She was seen by nephrology August 2022.  She is a patient of Dr Ezequiel Essex. The thoughts are to continue dialogue regarding dialysis. If she is agreeable we can have access placed in hospital   Blood pressure 125/92 pulse 63 temperature 97.8 O2 sats 96% room air  Sodium 131 potassium 4.8 chloride 98 CO2 20 BUN 31 creatinine 5.2 glucose 164 calcium 8.7 hemoglobin 8.4  Medications: Aspirin 81 mg daily Coreg 12.5 mg twice daily iron 325 mg daily insulin, Demadex 40 mg daily.  Amlodipine 5 mg daily Lipitor 40 mg daily Cymbalta 20 mg daily risperidone 3 mg nightly  Right heart catheterization 07/14/2021 primary pulmonary venous hypertension with elevated right and left-sided heart filling pressures.  ECHO    EF 60 %  Creat  Date/Time Value Ref Range Status  08/17/2014 04:41 PM 0.73 0.50 - 1.10 mg/dL Final   Creatinine, Ser  Date/Time Value Ref Range Status  09/07/2021 12:58 AM 5.20 (H) 0.44 - 1.00 mg/dL Final  09/06/2021 07:26 AM 4.68 (H) 0.44 - 1.00 mg/dL Final  09/05/2021 06:50 AM 4.14 (H) 0.44 - 1.00 mg/dL Final  09/04/2021 11:45 AM 3.71 (H) 0.44 - 1.00 mg/dL Final  08/12/2021 06:37 PM 3.07 (H) 0.44 - 1.00 mg/dL Final  08/04/2021 12:33 PM  3.37 (H) 0.44 - 1.00 mg/dL Final  08/01/2021 10:31 AM 2.76 (H) 0.57 - 1.00 mg/dL Final  07/23/2021 09:09 AM 3.99 (H) 0.44 - 1.00 mg/dL Final  07/22/2021 12:20 AM 4.07 (H) 0.44 - 1.00 mg/dL Final  07/21/2021 12:16 AM 4.00 (H) 0.44 - 1.00 mg/dL Final  07/20/2021 01:50 AM 3.85 (H) 0.44 - 1.00 mg/dL Final  07/19/2021 12:20 AM 3.68 (H) 0.44 - 1.00 mg/dL Final  07/18/2021 12:56 AM 3.68 (H) 0.44 - 1.00 mg/dL Final  07/17/2021 12:04 AM 3.57 (H) 0.44 - 1.00 mg/dL Final  07/16/2021 12:21 AM 3.34 (H) 0.44 - 1.00 mg/dL Final  07/15/2021 12:11 AM 3.13 (H) 0.44 - 1.00 mg/dL Final  07/14/2021 12:12 AM 3.08 (H) 0.44 - 1.00 mg/dL Final  07/13/2021 12:20 AM 3.07 (H) 0.44 - 1.00 mg/dL Final  07/12/2021 12:19 AM 2.93 (H) 0.44 - 1.00 mg/dL Final  07/11/2021 12:54 AM 2.86 (H) 0.44 - 1.00 mg/dL Final  07/10/2021 12:06 AM 2.81 (H) 0.44 - 1.00 mg/dL Final  07/09/2021 12:39 AM 2.66 (H) 0.44 - 1.00 mg/dL Final  07/08/2021 12:11 AM 2.71 (H) 0.44 - 1.00 mg/dL Final  07/07/2021 03:30 PM 2.56 (H) 0.44 - 1.00 mg/dL Final  06/30/2021 12:26 PM 2.63 (H) 0.57 - 1.00 mg/dL Final  06/15/2021 01:28 AM 2.91 (H) 0.44 - 1.00 mg/dL Final  06/14/2021 03:55 AM 2.93 (H) 0.44 - 1.00 mg/dL Final  06/13/2021 03:21 AM 3.11 (H) 0.44 - 1.00 mg/dL Final  06/12/2021 03:27 AM 3.15 (H) 0.44 - 1.00  mg/dL Final  06/11/2021 03:34 AM 2.49 (H) 0.44 - 1.00 mg/dL Final  06/10/2021 12:10 PM 2.61 (H) 0.44 - 1.00 mg/dL Final  05/28/2021 01:05 AM 2.28 (H) 0.44 - 1.00 mg/dL Final  05/12/2021 05:12 AM 2.50 (H) 0.44 - 1.00 mg/dL Final  05/11/2021 04:26 AM 2.32 (H) 0.44 - 1.00 mg/dL Final  05/10/2021 05:00 AM 2.31 (H) 0.44 - 1.00 mg/dL Final  05/09/2021 09:04 AM 1.97 (H) 0.44 - 1.00 mg/dL Final  04/27/2021 06:06 AM 2.02 (H) 0.44 - 1.00 mg/dL Final  04/21/2021 05:03 PM 2.03 (H) 0.57 - 1.00 mg/dL Final  03/07/2021 02:44 PM 1.76 (H) 0.57 - 1.00 mg/dL Final  03/01/2021 11:30 AM 2.15 (H) 0.57 - 1.00 mg/dL Final  02/23/2021 03:23 PM 1.90 (H) 0.57 - 1.00  mg/dL Final  05/06/2020 12:07 PM 1.18 (H) 0.57 - 1.00 mg/dL Final  04/22/2020 04:49 AM 1.24 (H) 0.44 - 1.00 mg/dL Final  04/21/2020 01:38 PM 1.89 (H) 0.44 - 1.00 mg/dL Final  04/21/2020 12:37 AM 2.42 (H) 0.44 - 1.00 mg/dL Final  04/20/2020 04:51 PM 2.38 (H) 0.44 - 1.00 mg/dL Final  03/07/2020 12:53 PM 0.87 0.44 - 1.00 mg/dL Final  03/06/2020 03:25 AM 0.97 0.44 - 1.00 mg/dL Final  03/05/2020 08:50 AM 0.86 0.44 - 1.00 mg/dL Final  03/04/2020 03:37 AM 1.09 (H) 0.44 - 1.00 mg/dL Final  03/03/2020 03:41 AM 1.72 (H) 0.44 - 1.00 mg/dL Final  03/01/2020 10:10 AM 1.31 (H) 0.44 - 1.00 mg/dL Final     PMHx:   Past Medical History:  Diagnosis Date   Anemia 2006   Depression 2014   previously on amitryptiline    Diabetes mellitus with neurological manifestation ([place]) 2006   Diabetic peripheral neuropathy ([place]) 04/26/2020   Fracture of left ankle 1997    Gastroparesis 07/2016   HOH (hard of hearing) 2004    Hyperlipidemia 2006   Hypertension 2006   IBS (irritable bowel syndrome) 2002    Leukopenia 2015    Macular degeneration 11/2019   Shingles 2009    Stroke [facility]) 2020   Thyroid nodule 2004    Past Surgical History:  Procedure Laterality Date   ABDOMINAL HYSTERECTOMY  2005   CATARACT EXTRACTION Left 11/2019   CESAREAN SECTION  1983    CHOLECYSTECTOMY N/A 03/05/2020   Procedure: LAPAROSCOPIC CHOLECYSTECTOMY WITH INTRAOPERATIVE CHOLANGIOGRAM;  Surgeon: Donnie Mesa, MD;  Location: [facility];  Service: General;  Laterality: N/A;   ESOPHAGOGASTRODUODENOSCOPY (EGD) WITH PROPOFOL Left 08/26/2014   Procedure: ESOPHAGOGASTRODUODENOSCOPY (EGD) WITH PROPOFOL;  Surgeon: Arta Silence, MD;  Location: [facility];  Service: Endoscopy;  Laterality: Left;   ESOPHAGOGASTRODUODENOSCOPY (EGD) WITH PROPOFOL N/A 03/03/2020   Procedure: ESOPHAGOGASTRODUODENOSCOPY (EGD) WITH PROPOFOL;  Surgeon: Jerene Bears, MD;  Location: [facility];  Service: Gastroenterology;  Laterality: N/A;   RIGHT HEART CATH N/A  07/14/2021   Procedure: RIGHT HEART CATH;  Surgeon: Larey Dresser, MD;  Location: [place] [facility];  Service: Cardiovascular;  Laterality: N/A;   RIGHT/LEFT HEART CATH AND CORONARY ANGIOGRAPHY N/A 04/27/2021   Procedure: RIGHT/LEFT HEART CATH AND CORONARY ANGIOGRAPHY;  Surgeon: [name], MD;  Location: [place] [facility];  Service: Cardiovascular;  Laterality: N/A;    Family Hx:  Family History  Problem Relation Age of Onset   Hypertension Mother    Heart disease Mother    Diabetes Mother    Thyroid disease Mother    Congestive Heart Failure Mother    Breast cancer Maternal Grandmother    Colon cancer Maternal Grandfather  Heart attack Sister    Heart disease Brother    Hyperlipidemia Brother    Hypertension Brother    Diabetes Father    Breast cancer Maternal Aunt     Social History:  reports that she has quit smoking. Her smoking use included cigarettes. She has a 0.13 pack-year smoking history. She has never used smokeless tobacco. She reports that she does not drink alcohol and does not use drugs.  Allergies:  Allergies  Allergen Reactions   Elemental Sulfur Hives and Other (See Comments)    PATIENT STATED THIS, MORE THAN LIKELY, SHOULD HAVE BEEN LOGGED AS "SULFA"   Hydralazine Hcl Other (See Comments)    Hair loss   Hydrocodone Itching and Other (See Comments)    Upset stomach   Metformin And Related Nausea And Vomiting and Other (See Comments)    Stomach pains, also   Other Nausea Only and Other (See Comments)    Lettuce- Does not digest this!!   Plaquenil [Hydroxychloroquine Sulfate] Hives   Shellfish-Derived Products Nausea Only and Other (See Comments)    Caused an upset stomach   Shrimp (Diagnostic) Nausea Only and Other (See Comments)    Upset stomach    Sulfa Antibiotics Hives    Medications: Prior to Admission medications   Medication Sig Start Date End Date Taking? Authorizing Provider  acetaminophen (TYLENOL) 500 MG tablet Take 500 mg by  mouth every 6 (six) hours as needed for moderate pain or headache.   Yes [provider]  amLODipine (NORVASC) 5 MG tablet Take 1 tablet (5 mg total) by mouth daily. 07/22/21  Yes Joette Catching, PA-C  aspirin EC 81 MG tablet Take 1 tablet (81 mg total) by mouth daily. 11/05/19  Yes [name], MD  atorvastatin (LIPITOR) 40 MG tablet Take 1 tablet (40 mg total) by mouth daily. 07/22/21  Yes Joette Catching, PA-C  carvedilol (COREG) 12.5 MG tablet Take 1 tablet (12.5 mg total) by mouth 2 (two) times daily with a meal. 07/21/21  Yes Joette Catching, PA-C  DULoxetine (CYMBALTA) 20 MG capsule TAKE 1 CAPSULE (20 MG TOTAL) BY MOUTH DAILY. 02/06/21 02/06/22 Yes Eulis Canner E, NP  ferrous sulfate 325 (65 FE) MG tablet Take 1 tablet (325 mg total) by mouth daily with breakfast. 08/01/21  Yes [name], MD  insulin glargine (LANTUS) 100 UNIT/ML Solostar Pen Inject 55 Units into the skin at bedtime. 06/30/21  Yes [name], MD  insulin lispro (HUMALOG KWIKPEN) 100 UNIT/ML KwikPen Inject 4 units before breakfast and dinner. Patient taking differently: Inject 4 Units into the skin every evening. 07/05/20  Yes Minette Brine, Amy J, NP  risperiDONE (RISPERDAL) 3 MG tablet TAKE 1 TABLET (3 MG TOTAL) BY MOUTH AT BEDTIME. 02/06/21 02/06/22 Yes Eulis Canner E, NP  torsemide (DEMADEX) 20 MG tablet Take 2 tablets (40 mg total) by mouth daily. 07/26/21  Yes Barrett, Evelene Croon, PA-C  Blood Glucose Monitoring Suppl (ONETOUCH VERIO) w/Device KIT Check blood sugar three times daily. E11.40 05/06/20   [name], MD  Blood Glucose Monitoring Suppl (TRUE METRIX METER) w/Device KIT Check blood sugar three times daily. E11.40 07/11/20   [name], MD  glucose blood test strip Use as instructedCheck blood sugar three times daily. E11.40 07/11/20   [name], MD  Insulin NPH Isophane & Regular (RELION 70/30 [place]) Inject 35 Units into the skin 2 (two) times daily.   08/17/14  [provider]  Labs:  Results for orders placed or performed during the hospital encounter of 09/04/21 (from the past 48 hour(s))  Glucose, capillary     Status: Abnormal   Collection Time: 09/06/21 11:25 AM  Result Value Ref Range   Glucose-Capillary 137 (H) 70 - 99 mg/dL    Comment: Glucose reference range applies only to samples taken after fasting for at least 8 hours.  Glucose, capillary     Status: Abnormal   Collection Time: 09/06/21  4:59 PM  Result Value Ref Range   Glucose-Capillary 166 (H) 70 - 99 mg/dL    Comment: Glucose reference range applies only to samples taken after fasting for at least 8 hours.  Glucose, capillary     Status: Abnormal   Collection Time: 09/06/21  8:27 PM  Result Value Ref Range   Glucose-Capillary 177 (H) 70 - 99 mg/dL    Comment: Glucose reference range applies only to samples taken after fasting for at least 8 hours.  CBC     Status: Abnormal   Collection Time: 09/07/21 12:58 AM  Result Value Ref Range   WBC 14.6 (H) 4.0 - 10.5 K/uL   RBC 3.01 (L) 3.87 - 5.11 MIL/uL   Hemoglobin 8.4 (L) 12.0 - 15.0 g/dL   HCT 28.0 (L) 36.0 - 46.0 %   MCV 93.0 80.0 - 100.0 fL   MCH 27.9 26.0 - 34.0 pg   MCHC 30.0 30.0 - 36.0 g/dL   RDW 15.1 11.5 - 15.5 %   Platelets 365 150 - 400 K/uL   nRBC 0.0 0.0 - 0.2 %    Comment: Performed at [facility], [place] [address] [place], [place] [address]  Basic metabolic panel     Status: Abnormal   Collection Time: 09/07/21 12:58 AM  Result Value Ref Range   Sodium 131 (L) 135 - 145 mmol/L   Potassium 4.8 3.5 - 5.1 mmol/L   Chloride 98 98 - 111 mmol/L   CO2 20 (L) 22 - 32 mmol/L   Glucose, Bld 164 (H) 70 - 99 mg/dL    Comment: Glucose reference range applies only to samples taken after fasting for at least 8 hours.   BUN 51 (H) 8 - 23 mg/dL   Creatinine, Ser 5.20 (H) 0.44 - 1.00 mg/dL   Calcium 8.7 (L) 8.9 - 10.3 mg/dL   GFR, Estimated 9 (L) >60 mL/min    Comment: (NOTE) Calculated  using the CKD-EPI Creatinine Equation (2021)    Anion gap 13 5 - 15    Comment: Performed at [facility] [address], [place],  [address]  Magnesium     Status: Abnormal   Collection Time: 09/07/21 12:58 AM  Result Value Ref Range   Magnesium 2.6 (H) 1.7 - 2.4 mg/dL    Comment: Performed at [place] [address], [place], [place] [address]  Glucose, capillary     Status: Abnormal   Collection Time: 09/07/21  6:58 AM  Result Value Ref Range   Glucose-Capillary 124 (H) 70 - 99 mg/dL    Comment: Glucose reference range applies only to samples taken after fasting for at least 8 hours.  Urinalysis, Routine w reflex microscopic     Status: Abnormal   Collection Time: 09/07/21  7:25 AM  Result Value Ref Range   Color, Urine AMBER (A) YELLOW    Comment: BIOCHEMICALS MAY BE AFFECTED BY COLOR   APPearance CLOUDY (A) CLEAR   Specific Gravity, Urine 1.021 1.005 -  1.030   pH 5.0 5.0 - 8.0   Glucose, UA NEGATIVE NEGATIVE mg/dL   Hgb urine dipstick SMALL (A) NEGATIVE   Bilirubin Urine NEGATIVE NEGATIVE   Ketones, ur 5 (A) NEGATIVE mg/dL   Protein, ur 100 (A) NEGATIVE mg/dL   Nitrite NEGATIVE NEGATIVE   Leukocytes,Ua NEGATIVE NEGATIVE   RBC / HPF 0-5 0 - 5 RBC/hpf   Bacteria, UA NONE SEEN NONE SEEN   Squamous Epithelial / LPF 0-5 0 - 5   Mucus PRESENT    Amorphous Crystal PRESENT     Comment: Performed at [facility], [place] [address] [place], [place] [address]  Sodium, urine, random     Status: None   Collection Time: 09/07/21  7:26 AM  Result Value Ref Range   Sodium, Ur <10 mmol/L    Comment: Performed at [place] [address] [place], [place] [address]  Glucose, capillary     Status: None   Collection Time: 09/07/21 11:21 AM  Result Value Ref Range   Glucose-Capillary 97 70 - 99 mg/dL    Comment: Glucose reference range applies only to samples taken after fasting for at least 8 hours.  PTH, intact and calcium     Status: Abnormal    Collection Time: 09/07/21 11:29 AM  Result Value Ref Range   PTH 113 (H) 15 - 65 pg/mL   Calcium, Total (PTH) 8.3 (L) 8.7 - 10.3 mg/dL   PTH Interp Comment     Comment: (NOTE) Interpretation                 Intact PTH    Calcium                                (pg/mL)      (mg/dL) Normal                          15 - 65     8.6 - 10.2 Primary Hyperparathyroidism         >65          >10.2 Secondary Hyperparathyroidism       >65          <10.2 Non-Parathyroid Hypercalcemia       <65          >10.2 Hypoparathyroidism                  <15          < 8.6 Non-Parathyroid Hypocalcemia    15 - 65          < 8.6 Performed At: [facility] [place], [place] [id number] Rush Farmer MD EZ:[id number]   Glucose, capillary     Status: Abnormal   Collection Time: 09/07/21  4:13 PM  Result Value Ref Range   Glucose-Capillary 135 (H) 70 - 99 mg/dL    Comment: Glucose reference range applies only to samples taken after fasting for at least 8 hours.  Glucose, capillary     Status: Abnormal   Collection Time: 09/07/21  9:20 PM  Result Value Ref Range   Glucose-Capillary 104 (H) 70 - 99 mg/dL    Comment: Glucose reference range applies only to samples taken after fasting for at least 8 hours.  Glucose, capillary     Status: None   Collection Time: 09/08/21  6:26 AM  Result  Value Ref Range   Glucose-Capillary 79 70 - 99 mg/dL    Comment: Glucose reference range applies only to samples taken after fasting for at least 8 hours.  Glucose, capillary     Status: None   Collection Time: 09/08/21  7:50 AM  Result Value Ref Range   Glucose-Capillary 82 70 - 99 mg/dL    Comment: Glucose reference range applies only to samples taken after fasting for at least 8 hours.     ROS:  As per HPI  Physical Exam: Vitals:   09/07/21 2240 09/08/21 0751  BP: (!) 154/66 (!) 125/92  Pulse: 70 63  Resp:  18  Temp:  97.8 F (36.6 C)  SpO2:  96%     General: Appears to be stable with no  acute illness.  Nondistressed HEENT: Normocephalic atraumatic Eyes: Pupils round equal reactive Neck: Supple no thyromegaly adenopathy Heart: Regular rate and rhythm no murmurs rubs gallops Lungs: No wheeze or rales Abdomen: Soft nontender Extremities: Bilateral lower extremity edema Skin: Diabetic foot ulcer Neuro: Grossly intact  Assessment/Plan: 1.Progressive renal insufficiency.  Probably secondary to diabetic nephropathy.  Has been evaluated and treated by nephrology in the past.  She will need outpatient follow-up with her nephrologist.  There are no absolute indications for dialysis at this point.  Continue to avoid nephrotoxins.  No ACE inhibitor's ARB's nonsteroidal anti-inflammatory drugs.  No IV contrast.  Renally adjust medications.  There does appear to have been some fairly drastic progression of her renal disease over the last 12 months.  This does not appear well. 2. Hypertension/volume  -appears to have some mild volume overload.  Would continue home dose of diuretics. 3.  Anemia iron stores low.  I am avoiding iron at this present time due to diabetic ulcer.  Transfuse. 4.  Diabetic foot ulcer.  Followed by vascular surgery.  Really avoiding use of IV contrast due to CKD 5.   5.  Will need placement of AV fistula at some point.  She is already being followed by vascular surgery.    She is followed by Dr Buddy Duty 09/08/2021, 8:05 AM

## 2021-09-08 NOTE — Progress Notes (Addendum)
Nancy [name] is a 60 year old female with a past medical history of HFpEF with a pertinent family history of CHF in both parents, CKD Stage IV, TIA, and Type II DM who presented with left foot pain and was admitted for a left gangrenous heel ulcer secondary to diabetic neuropathy.  Subjective:  No acute concerns or events overnight.  Husband at bedside. Patient denies shortness of breath, chest pain, foot pain.  Discussed her thoughts on amputation.  Patient declining amputation and will choose medical management.  Discussed plan to set up for outpatient wound care and p.o. antibiotics.  Objective:  Vital signs in last 24 hours: Vitals:   09/08/21 0751 09/08/21 1131 09/08/21 1213 09/08/21 1326  BP: (!) 125/92 (!) 155/86 136/76 118/64  Pulse: 63 64 63 98  Resp: 18 18 20 18   Temp: 97.8 F (36.6 C) 98.6 F (37 C) 98.6 F (37 C) 99.3 F (37.4 C)  TempSrc: Oral Oral  Oral  SpO2: 96% 100%  100%  Weight:      Height:       Weight change:   Intake/Output Summary (Last 24 hours) at 09/08/2021 1503 Last data filed at 09/08/2021 0013 Gross per 24 hour  Intake 250 ml  Output 70 ml  Net 180 ml    Physical Exam Vitals reviewed.  Constitutional:      Appearance: Normal appearance.  HENT:     Head: Normocephalic and atraumatic.  Eyes:     Comments: Left ptosis  Cardiovascular:     Rate and Rhythm: Normal rate and regular rhythm.  Pulmonary:     Effort: Pulmonary effort is normal. No respiratory distress.     Comments: Lungs clear to auscultation Abdominal:     Palpations: Abdomen is soft.  Skin:    General: Skin is warm and dry.     Comments: Prevalon boots in place bilaterally  Neurological:     Mental Status: She is alert and oriented to person, place, and time.  Psychiatric:        Mood and Affect: Mood normal.     Comments: Slow to respond to questions    Assessment/Plan:  Active Problems:   AKI (acute kidney injury) ([place])   Diabetic foot ulcer (HCC)    Cellulitis of left foot   Gangrene of left foot [facility])  [name] is a 60 year old female with a past medical history of HFpEF with a pertinent family history of CHF in both parents, CKD Stage IV, TIA, and Type II DM who presented with left foot pain and was admitted for a left heel ulcer secondary to diabetic neuropathy.  #Gangrenous Diabetic Foot Ulcer, heel of left foot #Diabetic Foot Ulcer, lateral L foot #Left and Right Dorsal Foot Skin Lacerations #PAD Patient has a diabetic foot ulcers 2/2 diabetic neuropathy and is uncertain how long they have been present.  Difficulty with ambulation due to pain. MRI without evidence of osteomyelitis.  Likely no one has inspected the feet at follow-up appointments or at home since last admission.  Patient presented to the ED on 09/19, and was given broad-spec abx for gangrenous ulcer and concern for OM. ABIs completed, and the patient has moderate bilateral LE arterial disease.  Treated with IV antibiotics.  Vascular and Ortho recommend transtibial amputation.  Patient declining at this time.  We will pursue wound care outpatient and medical management.  Transition from IV antibiotics to p.o. antibiotics on 9/23. - Vascular surgery following, appreciate recommendations - Orthopedics consulted, appreciate  recommendations -Continue aspirin 81 mg daily, Lipitor 40 mg daily. - Vascular surgery and orthopedics recommend L transtibial amputation.  Patient declined. -Blood cultures NGTD -Day 5 of antibiotics: Currently on doxycycline p.o. and Augmentin p.o., and to continue antibiotics at discharge - Oxycodone 6mg  tablet q6 PRN and tylenol 650mg  q6 PRN for pain - Wound care consulted, appreciate recommendations      - Twice daly iodine application to all wounds on both feet      - Prevalon heel lift boots -Amb referral to wound care -Take Prevalon boots home and continue to use for heel offloading -PT/OT skilled for SNF, patient declining SNF,  amendable to [facility] PT/OT, DME bedside commode   #Acute on chronic HFpEF exacerbation Patient presented with lower extremity edema concerning for acute on chronic HFpEF exacerbation, which has improved with lasix. Patient had echo 06/22 with EF of 55 to 60%.  -Restarted torsemide 20 mg twice daily - Continue coreg 12.5 mg BID - Strict I/O's - Daily weight   #AKI on CKD Stage V #Oliguria #Anemia of CKD Patient has progressive renal insufficiency likely secondary to diabetic nephropathy. Patient had worsening kidney function this admission with creatinine 5.2 on 9/21.  UA without concerns for infection or casts.  Urine sodium <10.  AKI possibly due to cardiorenal type I, though BUN/CR more consistent with intrinsic kidney damage.  Renal ultrasound normal.  PTH elevated 113.  Patient continues to have oliguria. -Nephrology consulted, appreciate recs -No indication for dialysis or phosphate binders at this time -Restart home torsemide for volume control -Darbepoetin alfa every 4 weeks -Holding iron due to gangrenous foot ulcer - Strict I/O's - Avoid contrast to kidney or nephrotoxic agents, avoid ACE inhibitors, ARB's, NSAIDs -Patient will need AV fistula placement at some point -Outpatient follow-up with nephrologist   #Poorly Controlled Type II DM #Diabetic neuropathy Patient monitors her blood sugar once daily before meals, and her home sugars have been between 178-197. Most recent A1c is 6.8. Her husband says she has been eating well in the hospital.  Blood sugars appropriate this admission. - Continue lantus and humalog + SSI - Continue home duloxetine 20 mg daily   #History of tactile hallucinations - Patient was continued on risperidone 3 mg nightly  #HTN Patient's blood pressure medications: Amlodipine 5 mg daily, carvedilol 12.5 mg twice daily, were continued.  Medications continue this admission: Cymbalta 20 mg daily   Best Practice: Diet: Heart healthy diet IVF: None VTE:  Heparin 5,000 units Code: Full AB: Doxy + Augmentin both p.o.  Dispo: Admit patient to Inpatient with expected length of stay greater than 2 midnights.   LOS: 3 days   Wayland Denis, MD 09/08/2021, 3:03 PM Pager: [phone] If after 5 PM on weekdays, or on weekends please page: [phone]

## 2021-09-09 LAB — GLUCOSE, CAPILLARY
Glucose-Capillary: 129 mg/dL — ABNORMAL HIGH (ref 70–99)
Glucose-Capillary: 107 mg/dL — ABNORMAL HIGH (ref 70–99)
Glucose-Capillary: 124 mg/dL — ABNORMAL HIGH (ref 70–99)
Glucose-Capillary: 132 mg/dL — ABNORMAL HIGH (ref 70–99)

## 2021-09-09 LAB — TYPE AND SCREEN
ABO/RH(D): A POS
Antibody Screen: NEGATIVE
Unit division: 0

## 2021-09-09 LAB — CULTURE, BLOOD (ROUTINE X 2)
Culture: NO GROWTH
Culture: NO GROWTH

## 2021-09-09 LAB — RENAL FUNCTION PANEL
Albumin: 2.4 g/dL — ABNORMAL LOW (ref 3.5–5.0)
Anion gap: 13 (ref 5–15)
BUN: 58 mg/dL — ABNORMAL HIGH (ref 8–23)
CO2: 18 mmol/L — ABNORMAL LOW (ref 22–32)
Calcium: 8.3 mg/dL — ABNORMAL LOW (ref 8.9–10.3)
Chloride: 100 mmol/L (ref 98–111)
Creatinine, Ser: 6.19 mg/dL — ABNORMAL HIGH (ref 0.44–1.00)
GFR, Estimated: 7 mL/min — ABNORMAL LOW (ref >60)
Glucose, Bld: 142 mg/dL — ABNORMAL HIGH (ref 70–99)
Phosphorus: 7 mg/dL — ABNORMAL HIGH (ref 2.5–4.6)
Potassium: 4.9 mmol/L (ref 3.5–5.1)
Sodium: 131 mmol/L — ABNORMAL LOW (ref 135–145)

## 2021-09-09 LAB — CBC
HCT: 27 % — ABNORMAL LOW (ref 36.0–46.0)
Hemoglobin: 8.4 g/dL — ABNORMAL LOW (ref 12.0–15.0)
MCH: 28.5 pg (ref 26.0–34.0)
MCHC: 31.1 g/dL (ref 30.0–36.0)
MCV: 91.5 fL (ref 80.0–100.0)
Platelets: 318 10*3/uL (ref 150–400)
RBC: 2.95 MIL/uL — ABNORMAL LOW (ref 3.87–5.11)
RDW: 16.5 % — ABNORMAL HIGH (ref 11.5–15.5)
WBC: 13.7 10*3/uL — ABNORMAL HIGH (ref 4.0–10.5)
nRBC: 0 % (ref 0.0–0.2)

## 2021-09-09 LAB — BPAM RBC
Blood Product Expiration Date: 202210172359
ISSUE DATE / TIME: 202209231147
Unit Type and Rh: 6200

## 2021-09-09 MED ORDER — AMOXICILLIN-POT CLAVULANATE 250-125 MG PO TABS: 1.0000 | ORAL_TABLET | Freq: Two times a day (BID) | ORAL | 0 refills | Status: DC

## 2021-09-09 MED ORDER — CALCIUM ACETATE (PHOS BINDER) 667 MG PO CAPS: 667.0000 mg | ORAL_CAPSULE | Freq: Three times a day (TID) | ORAL | 0 refills | Status: DC

## 2021-09-09 MED ORDER — CALCIUM ACETATE (PHOS BINDER) 667 MG PO CAPS
667.0000 mg | ORAL_CAPSULE | Freq: Three times a day (TID) | ORAL | Status: DC
  Administered 2021-09-09 – 2021-09-21 (×36): 667 mg via ORAL
  Filled 2021-09-09 (×38): qty 1

## 2021-09-09 MED ORDER — DOXYCYCLINE HYCLATE 100 MG PO TABS: 100.0000 mg | ORAL_TABLET | Freq: Two times a day (BID) | ORAL | 0 refills | Status: DC

## 2021-09-09 NOTE — Discharge Summary (Addendum)
Name: Nancy [name] MRN: [id number] DOB: 25-Dec-1960 60 y.o. PCP: Nancy Pier, MD  Date of Admission: 09/04/2021 11:33 AM Date of Discharge: 09/09/21 Attending Physician: Dr. Philipp Ovens  Discharge Diagnosis: 1. Diabetic foot ulcers   Discharge Medications: Allergies as of 09/09/2021       Reactions   Elemental Sulfur Hives, Other (See Comments)   PATIENT STATED THIS, MORE THAN LIKELY, SHOULD HAVE BEEN LOGGED AS "SULFA"   Hydralazine Hcl Other (See Comments)   Hair loss   Hydrocodone Itching, Other (See Comments)   Upset stomach   Metformin And Related Nausea And Vomiting, Other (See Comments)   Stomach pains, also   Other Nausea Only, Other (See Comments)   Lettuce- Does not digest this!!   Plaquenil [hydroxychloroquine Sulfate] Hives   Shellfish-derived Products Nausea Only, Other (See Comments)   Caused an upset stomach   Shrimp (diagnostic) Nausea Only, Other (See Comments)   Upset stomach    Sulfa Antibiotics Hives        Medication List     TAKE these medications    acetaminophen 500 MG tablet Commonly known as: TYLENOL Take 500 mg by mouth every 6 (six) hours as needed for moderate pain or headache.   amLODipine 5 MG tablet Commonly known as: NORVASC Take 1 tablet (5 mg total) by mouth daily.   amoxicillin-clavulanate 250-125 MG tablet Commonly known as: AUGMENTIN Take 1 tablet by mouth every 12 (twelve) hours for 21 days.   aspirin EC 81 MG tablet Take 1 tablet (81 mg total) by mouth daily.   atorvastatin 40 MG tablet Commonly known as: LIPITOR Take 1 tablet (40 mg total) by mouth daily.   calcium acetate 667 MG capsule Commonly known as: PHOSLO Take 1 capsule (667 mg total) by mouth 3 (three) times daily with meals.   carvedilol 12.5 MG tablet Commonly known as: COREG Take 1 tablet (12.5 mg total) by mouth 2 (two) times daily with a meal.   doxycycline 100 MG tablet Commonly known as: VIBRA-TABS Take 1 tablet (100 mg total) by  mouth every 12 (twelve) hours for 21 days.   DULoxetine 20 MG capsule Commonly known as: CYMBALTA TAKE 1 CAPSULE (20 MG TOTAL) BY MOUTH DAILY.   FeroSul 325 (65 FE) MG tablet Generic drug: ferrous sulfate Take 1 tablet (325 mg total) by mouth daily with breakfast.   glucose blood test strip Use as instructedCheck blood sugar three times daily. E11.40   insulin glargine 100 UNIT/ML Solostar Pen Commonly known as: LANTUS Inject 55 Units into the skin at bedtime.   insulin lispro 100 UNIT/ML KwikPen Commonly known as: HumaLOG KwikPen Inject 4 units before breakfast and dinner. What changed:  how much to take how to take this when to take this additional instructions   OneTouch Verio w/Device Kit Check blood sugar three times daily. E11.40   True Metrix Meter w/Device Kit Check blood sugar three times daily. E11.40   risperiDONE 3 MG tablet Commonly known as: RISPERDAL TAKE 1 TABLET (3 MG TOTAL) BY MOUTH AT BEDTIME.   torsemide 20 MG tablet Commonly known as: DEMADEX Take 2 tablets (40 mg total) by mouth daily.               Durable Medical Equipment  (From admission, onward)           Start     Ordered   09/08/21 1521  For home use only DME 3 n 1  Once  09/08/21 1522            Disposition and follow-up:   [name] was [facility] from [facility] in Stable condition.  At the hospital follow up visit please address:  1.  Wound care of bilateral feet, repeat BMP, CBC to ensure kidney function stable as well as blood counts. Follow up with PCP, nephrology, orthopedics for wound care and ongoing discussions surrounding possible amputation.   2.  Labs / imaging needed at time of follow-up: CBC, BMP  3.  Pending labs/ test needing follow-up: None   Follow-up Appointments:  Follow-up [place] Follow up.   Contact information: [place] [id number] [phone]                Hospital Course:   [name] is a 60 year old female with a past medical history of HFpEF with a pertinent family history of CHF in both parents, CKD Stage IV, TIA, and Type II DM who presented with left foot pain and was admitted for a left heel ulcer secondary to diabetic neuropathy.   #Gangrenous Diabetic Foot Ulcer, heel of left foot #Diabetic Foot Ulcer, lateral L foot #Left and Right Dorsal Foot Skin Lacerations #PAD Patient has diabetic foot ulcers in the setting of diabetic neuropathy and is uncertain how long they have been present.  Difficulty with ambulation due to pain. MRI without evidence of osteomyelitis. Patient presented to the ED on 09/19, and was given broad-spec abx for gangrenous ulcer and concern for OM. ABIs completed, and the patient has moderate bilateral LE arterial disease.  Treated with IV antibiotics.  Vascular and Ortho recommend transtibial amputation given the low likelihood of her wound to heal.  Patient declining at this time and would like to try and salvage her left lower extremity with oral antibiotics and wound care.  It was recommended that patient go to a SNF however she is declining SNF placement at this time.  She was discharged with a bedside commode.  Patient will discharge with a 3-week course of oral doxycycline and Augmentin.  She also discharged with Prevalon heel boots to help offload pressure.  She also be set up with orthopedic surgery for wound care and continued discussions regarding possible amputation. Will continue her home aspirin 81 mg daily, Lipitor 40 mg daily.    #Acute on chronic HFpEF exacerbation Patient presented with lower extremity edema concerning for acute on chronic HFpEF exacerbation, which has improved with lasix. Patient had echo 06/22 with EF of 55 to 60%.  She was started back on her home torsemide dose which she will continue on discharge.  She also  continue her home Coreg.  #AKI on CKD Stage V #Oliguria #Anemia of CKD/Fe deficiency  Patient has progressive renal insufficiency likely secondary to diabetic nephropathy. Patient had worsening kidney function this admission with creatinine 5.2 on 9/21.  UA without concerns for infection or casts.  Urine sodium <10.  AKI possibly due to cardiorenal type I, though BUN/CR more consistent with intrinsic kidney damage.  Renal ultrasound normal.  PTH elevated 113.  Patient continues to have oliguria. -Nephrology consulted this hospital admission and stated that patient would likely need AV fistula and hemodialysis.  This was discussed with patient and she is currently not interested in hemodialysis.  There was no urgent or emergent need for hemodialysis at this time.  Patient received a one-time dose of darbepoetin  this admission.  She will discharge home with a phosphate binder and will continue her home torsemide for hypervolemia.  Schedule an appointment in outpatient setting with nephrology. -Patient also had anemia down to 6.9 she received 1 u of pRBCs, darbepoetin, and IV iron. She will discharge on her home oral iron supplementation. Will likely require darbepoetin or other ESA in the outpatient setting.     #Poorly Controlled Type II DM #Diabetic neuropathy Patient monitors her blood sugar once daily before meals, and her home sugars have been between 178-197. Most recent A1c is 6.8. Her husband says she has been eating well in the hospital.  Blood sugars appropriate this admission. She was continued on her home lantus and humalog + SSI And her home duloxetine 20 mg daily for her neuropathy.  She was discharged on these medications   #History of tactile hallucinations - Patient was continued on risperidone 3 mg nightly while inpatient.     #HTN Patient's blood pressure medications: Amlodipine 5 mg daily, carvedilol 12.5 mg twice daily, were continued. She was mostly normotensive while  inpatient. She will discharge on these.   On the day of discharge, patient denied any chest pain shortness of breath.  She felt her leg swelling was decreasing.  She endorsed understanding of the follow-up appointments that she needs to have.  Discharge Exam:   BP (!) 183/79   Pulse 67   Temp 98.2 F (36.8 C) (Oral)   Resp 18   Ht _0  (1.626 m)   Wt 105.2 kg   SpO2 98%   BMI 39.82 kg/m  Discharge exam:   Gen: Nad CV: RRR, no m/r/g Pulm: Non labored breathing on RA, no wheezing or crackles Abd: Soft, NT, ND Ext: Feet with dressings, c/d/I, 2+ pitting edema to the knees bilaterally   Pertinent Labs, Studies, and Procedures:  BMP Latest Ref Rng & Units 09/09/2021 09/08/2021 09/07/2021  Glucose 70 - 99 mg/dL 142(H) 89 164(H)  BUN 8 - 23 mg/dL 58(H) 57(H) 51(H)  Creatinine 0.44 - 1.00 mg/dL 6.19(H) 5.99(H) 5.20(H)  BUN/Creat Ratio 12 - 28 - - -  Sodium 135 - 145 mmol/L 131(L) 132(L) 131(L)  Potassium 3.5 - 5.1 mmol/L 4.9 4.7 4.8  Chloride 98 - 111 mmol/L 100 101 98  CO2 22 - 32 mmol/L 18(L) 18(L) 20(L)  Calcium 8.9 - 10.3 mg/dL 8.3(L) 8.2(L) 8.3(L)   CBC Latest Ref Rng & Units 09/09/2021 09/08/2021 09/08/2021  WBC 4.0 - 10.5 K/uL 13.7(H) - 13.9(H)  Hemoglobin 12.0 - 15.0 g/dL 8.4(L) 8.0(L) 6.9(LL)  Hematocrit 36.0 - 46.0 % 27.0(L) 27.1(L) 23.0(L)  Platelets 150 - 400 K/uL 318 - 306     Discharge Instructions: Discharge Instructions     AMB referral to wound care center   Complete by: As directed    Gangrenous diabetic foot ulcer       Signed: Rick Duff, MD 09/09/2021, 1:55 PM   Pager: [phone]

## 2021-09-09 NOTE — TOC Initial Note (Addendum)
Transition of Care [facility]) - Initial/Assessment Note    Patient Details  Name: Nancy [name] MRN: [id number] Date of Birth: [birth year]  Transition [facility] CM/SW Contact:    Bartholomew Crews, RN Phone Number: [phone] 09/09/2021, 5:08 PM  Clinical Narrative:                  12:00 pm - Spoke with patient at the bedside to discuss transition plans. Patient home with spouse who is her caregiver. She stated that spouse assists her with adls and transportation to medical appointments. She has DME at home to include cane, RW, and 3/1. Discussed recommenations for [facility] PT/OT, advised that CM will reach out to this week's charity agency for potential outpatient follow up. Patient verbalized understanding.   [place] letter provided for medication needs.   4:00 pm - Verbal consent from patient this afternoon to discuss plans to transition home with spouse. Message left for spouse with CM contact information.   5:00 pm - Unable to reach home health agency to place referral. Noted that patient has wounds and would benefit from [facility] RN if [facility] eligibility accepted.   Spoke with nurse who expressed concerns for safe discharge home stating that spouse visited earlier. Nurse stated that spouse works 3-11 and no one is home with patient during that time. MD notified of concerns. DC canceled for today.   TOC following for transition needs.   Expected Discharge Plan: [place] Barriers to Discharge: No [facility] will accept this patient   Patient Goals and CMS Choice Patient states their goals for this hospitalization and ongoing recovery are:: return home with husband CMS Medicare.gov Compare Post Acute Care list provided to:: Patient Choice offered to / list presented to : Patient  Expected Discharge Plan and Services Expected Discharge Plan: [place] In-house Referral: Clinical Social Work Discharge Planning Services: CM Consult Post Acute Care  Choice: Home Health, Durable Medical Equipment Living arrangements for the past 2 months: Apartment Expected Discharge Date: 09/09/21               DME Arranged: N/A DME Agency: NA                  Prior Living Arrangements/Services Living arrangements for the past 2 months: Apartment Lives with:: Self, Spouse Patient language and need for interpreter reviewed:: Yes Do you feel safe going back to the place where you live?: Yes      Need for Family Participation in Patient Care: Yes (Comment) Care giver support system in place?: Yes (comment) Current home services: DME (cane, RW, 3/1) Criminal Activity/Legal Involvement Pertinent to Current Situation/Hospitalization: No - Comment as needed  Activities of Daily Living Home Assistive Devices/Equipment: Other (Comment) (Cane) ADL Screening (condition at time of admission) Patient's cognitive ability adequate to safely complete daily activities?: Yes Is the patient deaf or have difficulty hearing?: Yes Does the patient have difficulty seeing, even when wearing glasses/contacts?: No Does the patient have difficulty concentrating, remembering, or making decisions?: No Patient able to express need for assistance with ADLs?: Yes Does the patient have difficulty dressing or bathing?: Yes Independently performs ADLs?: No Communication: Independent Dressing (OT): Needs assistance Is this a change from baseline?: Pre-admission baseline Grooming: Needs assistance Is this a change from baseline?: Pre-admission baseline Feeding: Independent Bathing: Needs assistance Is this a change from baseline?: Pre-admission baseline Toileting: Needs assistance Is this a change from baseline?: Pre-admission baseline In/Out Bed: Needs  assistance Is this a change from baseline?: Pre-admission baseline Walks in Home: Needs assistance Is this a change from baseline?: Pre-admission baseline Does the patient have difficulty walking or climbing stairs?:  Yes Weakness of Legs: Both Weakness of Arms/Hands: None  Permission Sought/Granted Permission sought to share information with : Family Supports    Share Information with NAME: Dezarae Mcclaran     Permission granted to share info w Relationship: spouse  Permission granted to share info w Contact Information: [phone]  Emotional Assessment Appearance:: Appears stated age Attitude/Demeanor/Rapport: Lethargic Affect (typically observed): Accepting Orientation: : Oriented to Self, Oriented to  Time, Oriented to Place, Oriented to Situation Alcohol / Substance Use: Not Applicable Psych Involvement: No (comment)  Admission diagnosis:  Diabetic foot ulcer ([place]) [Q22.297, L97.509] Cellulitis of left foot [L03.116] Patient Active Problem List   Diagnosis Date Noted   Gangrene of left foot ([place])    Cellulitis of left foot    Diabetic foot ulcer ([place]) 09/04/2021   CHF (congestive heart failure) ([place]) 07/07/2021   Normocytic anemia 06/30/2021   CKD (chronic kidney disease), stage IV (HCC) 98/92/1194   Acute diastolic CHF (congestive heart failure) ([place]) 06/10/2021   Abnormal nuclear stress test    Dyspnea on exertion 03/07/2021   Orthopnea 02/25/2021   History of cerebrovascular accident (CVA) with residual deficit 05/06/2020   Diabetic peripheral neuropathy ([place]) 04/26/2020   AKI (acute kidney injury) ([place]) 04/20/2020   Generalized weakness 04/20/2020   Dehydration 04/20/2020   Generalized abdominal pain    Pancreatitis, acute 02/29/2020   Cerebrovascular accident (CVA) ([place]) 11/08/2019   Stenosis of right carotid artery 11/08/2019   Hyperlipidemia associated with type 2 diabetes mellitus ([place]) 11/08/2019   Cortical age-related cataract of both eyes 08/30/2019   Gait abnormality 08/30/2019   Statin declined 08/30/2019   Gastroesophageal reflux disease without esophagitis 04/18/2018   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   Uncontrolled type 2 diabetes mellitus with diabetic neuropathy,  with long-term current use of insulin ([place]) 10/29/2017   History of macular degeneration 10/29/2017   Chronic cervical pain 10/29/2016   Myalgia 09/14/2016   Insomnia 09/14/2016   Tinea pedis of both feet 08/20/2016   Macular degeneration 08/17/2016   Low back pain 09/21/2014   Hypertension 09/21/2014   Diabetic gastroparesis associated with type 2 diabetes mellitus ([place]) 09/14/2014   Thyroid nodule 08/17/2014   Obesity (BMI 30-39.9) 08/17/2014   Hyperlipidemia 08/05/2014   Nausea & vomiting 08/04/2014   Type II diabetes mellitus with neurological manifestations, uncontrolled ([place]) 08/04/2014   PCP:  [name], MD Pharmacy:   [facility] [place] [address] [place] [address] Phone: [phone] Fax: [place] [address] [place] [address] Phone: [phone] Fax: [phone]  [id number] , [place] [address] Phone: [phone] Fax: [phone]     Social Determinants of Health ([place]) Interventions    Readmission Risk Interventions Readmission Risk Prevention Plan 03/07/2020  Transportation Screening Complete  PCP or Specialist Appt within 3-5 Days Complete  HRI or [place] (No Data)  Social Work Consult for [place] Planning/Counseling [place] Not Applicable  Medication Review Press photographer) Complete  Some recent data might be hidden

## 2021-09-09 NOTE — Progress Notes (Addendum)
[facility] ROUNDING NOTE   Subjective:   Interval History: This is a 60 year old lady who has a history of chronic kidney disease stage IV, history of TIA type 2 diabetes and diastolic heart failure.  She presented with left heel ulcer secondary diabetic neuropathy.  She appears to have a history of chronic kidney disease.  With a creatinine in August of 4 mg/dL.  Renal ultrasound 09/07/2021 did not show any evidence of hydronephrosis.  Urinalysis shows no evidence of red blood cells white blood cells and has 100 mg/dL protein.  She was seen by nephrology August 2022.  She is a patient of Dr Ezequiel Essex. The thoughts are to continue dialogue regarding dialysis. At this point she is not agreeable for access placed in hospital. She stated that she is not interested in proceeding with dialysis  Blood pressure 183/79 pulse 69 temperature 98.2 O2 sats 98% room  Sodium 131 potassium 4.9 chloride 100 CO2 18 BUN 58 creatinine 6.19 glucose 142 calcium 8.3 phosphorus 7.0 albumin 2.4 hemoglobin 8.4  Objective:  Vital signs in last 24 hours:  Temp:  [97.7 F (36.5 C)-99.3 F (37.4 C)] 98.2 F (36.8 C) (09/24 0457) Pulse Rate:  [62-98] 67 (09/24 0806) Resp:  [18-20] 18 (09/24 0457) BP: (118-183)/(61-86) 183/79 (09/24 0806) SpO2:  [98 %-100 %] 98 % (09/24 0806)  Weight change:  Filed Weights   09/04/21 1900 09/04/21 1930  Weight: 105.2 kg 105.2 kg    Intake/Output: I/O last 3 completed shifts: In: 1093 [P.O.:340; Blood:424; IV Piggyback:250] Out: 300 [Urine:300]   Intake/Output this shift:  No intake/output data recorded.  General: Appears to be stable with no acute illness.  Nondistressed HEENT: Normocephalic atraumatic Eyes: Pupils round equal reactive Neck: Supple no thyromegaly adenopathy Heart: Regular rate and rhythm no murmurs rubs gallops Lungs: No wheeze or rales Abdomen: Soft nontender Extremities: Bilateral lower extremity edema Skin: Diabetic foot ulcer Neuro:  Grossly intact   Basic Metabolic Panel: Recent Labs  Lab 09/04/21 1613 09/05/21 0650 09/06/21 0726 09/07/21 0058 09/07/21 1129 09/08/21 0732 09/09/21 0442  NA  --  133* 132* 131*  --  132* 131*  K  --  4.1 4.4 4.8  --  4.7 4.9  CL  --  101 100 98  --  101 100  CO2  --  22 18* 20*  --  18* 18*  GLUCOSE  --  119* 88 164*  --  89 142*  BUN  --  44* 48* 51*  --  57* 58*  CREATININE  --  4.14* 4.68* 5.20*  --  5.99* 6.19*  CALCIUM  --  8.5* 8.3* 8.7* 8.3* 8.2* 8.3*  MG 2.1 2.3  --  2.6*  --   --   --   PHOS 3.9  --   --   --   --  6.7* 7.0*    Liver Function Tests: Recent Labs  Lab 09/05/21 0650 09/09/21 0442  AST 9*  --   ALT 10  --   ALKPHOS 37*  --   BILITOT 0.7  --   PROT 6.5  --   ALBUMIN 2.5* 2.4*   No results for input(s): LIPASE, AMYLASE in the last 168 hours. No results for input(s): AMMONIA in the last 168 hours.  CBC: Recent Labs  Lab 09/04/21 1145 09/05/21 0650 09/07/21 0058 09/08/21 0732 09/08/21 1627 09/09/21 0442  WBC 17.1* 12.3* 14.6* 13.9*  --  13.7*  NEUTROABS 13.8*  --   --   --   --   --  HGB 8.2* 7.2* 8.4* 6.9* 8.0* 8.4*  HCT 27.1* 23.2* 28.0* 23.0* 27.1* 27.0*  MCV 91.9 92.4 93.0 93.5  --  91.5  PLT 317 279 365 306  --  318    Cardiac Enzymes: No results for input(s): CKTOTAL, CKMB, CKMBINDEX, TROPONINI in the last 168 hours.  BNP: Invalid input(s): POCBNP  CBG: Recent Labs  Lab 09/08/21 0750 09/08/21 1128 09/08/21 1604 09/08/21 2133 09/09/21 0800  GLUCAP 82 144* 144* 168* 129*    Microbiology: Results for orders placed or performed during the hospital encounter of 09/04/21  Blood culture (routine x 2)     Status: None   Collection Time: 09/04/21 12:00 PM   Specimen: BLOOD  Result Value Ref Range Status   Specimen Description BLOOD LEFT ANTECUBITAL  Final   Special Requests   Final    BOTTLES DRAWN AEROBIC AND ANAEROBIC Blood Culture results may not be optimal due to an inadequate volume of blood received in culture  bottles   Culture   Final    NO GROWTH 5 DAYS Performed at [facility], [place] [address] [place], [place] [address]    Report Status 09/09/2021 FINAL  Final  Blood culture (routine x 2)     Status: None   Collection Time: 09/04/21 12:06 PM   Specimen: BLOOD LEFT HAND  Result Value Ref Range Status   Specimen Description BLOOD LEFT HAND  Final   Special Requests   Final    BOTTLES DRAWN AEROBIC AND ANAEROBIC Blood Culture results may not be optimal due to an inadequate volume of blood received in culture bottles   Culture   Final    NO GROWTH 5 DAYS Performed at [facility], [place] [address], [place], [place] [address]    Report Status 09/09/2021 FINAL  Final  SARS CORONAVIRUS 2 (TAT 6-24 HRS) Nasopharyngeal Nasopharyngeal Swab     Status: None   Collection Time: 09/04/21  5:35 PM   Specimen: Nasopharyngeal Swab  Result Value Ref Range Status   SARS Coronavirus 2 NEGATIVE NEGATIVE Final    Comment: (NOTE) SARS-CoV-2 target nucleic acids are NOT DETECTED.  The SARS-CoV-2 RNA is generally detectable in upper and lower respiratory specimens during the acute phase of infection. Negative results do not preclude SARS-CoV-2 infection, do not rule out co-infections with other pathogens, and should not be used as the sole basis for treatment or other patient management decisions. Negative results must be combined with clinical observations, patient history, and epidemiological information. The expected result is Negative.  Fact Sheet for Patients: SugarRoll.be  Fact Sheet for Healthcare Providers: https://www.woods-mathews.com/  This test is not yet approved or cleared by the [place] FDA and  has been authorized for detection and/or diagnosis of SARS-CoV-2 by FDA under an Emergency Use Authorization (EUA). This EUA will remain  in effect (meaning this test can be used) for the duration of the COVID-19 declaration under Se  ction 564(b)(1) of the Act, 21 U.S.C. section 360bbb-3(b)(1), unless the authorization is terminated or revoked sooner.  Performed at [facility], [place] [address], [place], [place] [address]     Coagulation Studies: No results for input(s): LABPROT, INR in the last 72 hours.  Urinalysis: Recent Labs    09/07/21 0725  COLORURINE AMBER*  LABSPEC 1.021  PHURINE 5.0  GLUCOSEU NEGATIVE  HGBUR SMALL*  BILIRUBINUR NEGATIVE  KETONESUR 5*  PROTEINUR 100*  NITRITE NEGATIVE  LEUKOCYTESUR NEGATIVE      Imaging: US RENAL  Result Date: 09/07/2021 CLINICAL DATA:  Acute renal injury. EXAM: RENAL / URINARY TRACT ULTRASOUND COMPLETE COMPARISON:  July 20, 2021 FINDINGS: Right Kidney: Renal measurements: 11.2 cm x 5.3 cm x 5.3 cm = volume: 165.7 mL. Echogenicity within normal limits. No mass or hydronephrosis visualized. Left Kidney: Renal measurements: 12.0 cm x 6.5 cm x 5.1 cm = volume: 235.6 mL. Echogenicity within normal limits. No mass or hydronephrosis visualized. Bladder: The urinary bladder is empty and subsequently limited in evaluation. Other: None. IMPRESSION: Normal renal ultrasound. Electronically Signed   By: Virgina Norfolk M.D.   On: 09/07/2021 16:54     Medications:     amLODipine  5 mg Oral Daily   amoxicillin-clavulanate  1 tablet Oral Q12H   aspirin  81 mg Oral Daily   atorvastatin  40 mg Oral Daily   carvedilol  12.5 mg Oral BID   darbepoetin (ARANESP) injection - NON-DIALYSIS  40 mcg Subcutaneous Q30 days   doxycycline  100 mg Oral Q12H   DULoxetine  20 mg Oral Daily   heparin  5,000 Units Subcutaneous Q8H   insulin aspart  0-15 Units Subcutaneous TID WC   iron polysaccharides  150 mg Oral Q24H   risperiDONE  3 mg Oral QHS   senna-docusate  1 tablet Oral Daily   torsemide  20 mg Oral BID   acetaminophen, oxyCODONE  Assessment/ Plan:   1.Progressive renal insufficiency.  Probably secondary to diabetic nephropathy.  Has been evaluated and treated by  nephrology in the past.  She will need outpatient follow-up with her nephrologist.  There are no absolute indications for dialysis at this point.  Continue to avoid nephrotoxins.  No ACE inhibitor's ARB's nonsteroidal anti-inflammatory drugs.  No IV contrast.  Renally adjust medications.  There does appear to have been some fairly drastic progression of her renal disease over the last 12 months.  This does not appear well. She does not want dialysis at this point . We shall continue the dialogue 2. Hypertension/volume  -appears to have some mild volume overload.  Would continue home dose of diuretics. 3.  Anemia iron stores low.  I am avoiding iron at this present time due to diabetic ulcer.  Transfuse. 4.  Diabetic foot ulcer.  Followed by vascular surgery.  Really avoiding use of IV contrast due to CKD 5.   5.  Will need placement of AV fistula at some point.  She is already being followed by vascular surgery.   6.  Bone mineral.  We will add calcium phosphate binders.  We will check PTH     LOS: Whitney @TODAY @9 :11 AM

## 2021-09-09 NOTE — Discharge Instructions (Signed)
Nancy [name] regarding the wound on your foot you will need to follow-up with Dr. Jess Barters office for wound care and continued discussions regarding possible amputation if necessary.  In addition you need to schedule an appointment with the nephrologist or kidney doctors as your kidney function is now worsening.  Finally, you need an appointment with your primary care physician sometime next week.  I will call each of these offices on Monday to ensure that she have an appointment scheduled.  Please take your antibiotics as schedule and make sure to clean the wound on the bottom of your feet daily.  When you are sitting down or lying down please use the boots that we have provided you.

## 2021-09-09 NOTE — Progress Notes (Signed)
Physical Therapy Treatment Patient Details Name: Nancy [name] MRN: [id number] DOB: October 21, 1961 Today's Date: 09/09/2021   History of Present Illness 60 y.o. female admitted on 9/19 with necrotic L heel.  MRI (+) for ulceration with possible cellulitis/myositis, (-) for OM.  Recent admission x 3 weeks ago for CHF; per pt, poor fitting hospital socks caused initial foot wound. Pt has refused MD recommendation of BK amp to LLE.  PMH includes anemia, depression, DM, peripheral neuropathy, HLD, HTN, CVA, CHF, HOH, CKD    PT Comments    Pt was seen for mobility on side of bed, then to [facility] and then chair.  Her husband arrived and was supportive, talking about her skin routine he did for protection of her lower legs, including washing and placing skin cream on them.  Pt is requiring less help as she becomes a bit more alert, more responsive, and safer with moving.  PT is still recommending 24/7 at home to avoid a fall risk on her less alert moments, particularly since pt cannot even toilet herself yet.  Follow for HHPT plan but will need to consider rehab placement if the request for 24/7 is not there.   Recommendations for follow up therapy are one component of a multi-disciplinary discharge planning process, led by the attending physician.  Recommendations may be updated based on patient status, additional functional criteria and insurance authorization.  Follow Up Recommendations  Home health PT     Equipment Recommendations  None recommended by PT    Recommendations for Other Services OT consult     Precautions / Restrictions Precautions Precautions: Fall Precaution Comments: no WB orders but per notes "Offload" L LE Required Braces or Orthoses: Other Brace Other Brace: B prevelon boots Restrictions Weight Bearing Restrictions: No LLE Weight Bearing:  (minimize pressure on L heel) Other Position/Activity Restrictions: no WB orders in chart, per notes "offload" L LE     Mobility   Bed Mobility Overal bed mobility: Needs Assistance Bed Mobility: Supine to Sit     Supine to sit: Mod assist     General bed mobility comments: mod assist to get to side of bed with trunk support then mod to finish scooting out    Transfers Overall transfer level: Needs assistance Equipment used: Rolling walker (2 wheeled) Transfers: Sit to/from [facility] Sit to Stand: Min assist Stand pivot transfers: Mod assist       General transfer comment: pt required help but could get to [facility] and then to chair with min guard second pivot transition  Ambulation/Gait             General Gait Details: deferred due to her instructions on LLE   Stairs             Wheelchair Mobility    Modified Rankin (Stroke Patients Only)       Balance Overall balance assessment: Needs assistance Sitting-balance support: Feet supported Sitting balance-Leahy Scale: Fair   Postural control: Right lateral lean Standing balance support: Bilateral upper extremity supported;During functional activity Standing balance-Leahy Scale: Poor                              Cognition Arousal/Alertness: Lethargic Behavior During Therapy: Flat affect Overall Cognitive Status: Impaired/Different from baseline Area of Impairment: Problem solving;Awareness;Safety/judgement;Following commands;Memory;Attention;Orientation                 Orientation Level: Situation;Time Current Attention Level: Selective Memory: Decreased short-term  memory Following Commands: Follows one step commands inconsistently;Follows one step commands with increased time Safety/Judgement: Decreased awareness of safety;Decreased awareness of deficits Awareness: Anticipatory;Emergent Problem Solving: Slow processing;Requires verbal cues;Requires tactile cues General Comments: pt is limited by her flat affect, slow to respond to cues and directions      Exercises      General Comments  General comments (skin integrity, edema, etc.): pt was assisted to get to chair from [facility], noting her ease by comparison with initial efforts.  Pt had become more alert from being up on chair and was more focused on the goal      Pertinent Vitals/Pain Pain Assessment: Faces Faces Pain Scale: Hurts little more Pain Location: stomach Pain Descriptors / Indicators: Discomfort Pain Intervention(s): Limited activity within patient's tolerance;Monitored during session;Repositioned    Home Living                      Prior Function            PT Goals (current goals can now be found in the care plan section) Acute Rehab PT Goals Patient Stated Goal: get to chair Progress towards PT goals: Progressing toward goals    Frequency    Min 3X/week      PT Plan Current plan remains appropriate    Co-evaluation              AM-PAC PT "6 Clicks" Mobility   Outcome Measure  Help needed turning from your back to your side while in a flat bed without using bedrails?: A Little Help needed moving from lying on your back to sitting on the side of a flat bed without using bedrails?: A Little Help needed moving to and from a bed to a chair (including a wheelchair)?: A Little Help needed standing up from a chair using your arms (e.g., wheelchair or bedside chair)?: A Little Help needed to walk in hospital room?: A Little Help needed climbing 3-5 steps with a railing? : A Lot 6 Click Score: 17    End of Session Equipment Utilized During Treatment: Gait belt Activity Tolerance: Patient tolerated treatment well;Patient limited by fatigue Patient left: in chair;with call bell/phone within reach;with chair alarm set;with family/visitor present Nurse Communication: Mobility status PT Visit Diagnosis: Other abnormalities of gait and mobility (R26.89);Muscle weakness (generalized) (M62.81)     Time: 1610-9604 PT Time Calculation (min) (ACUTE ONLY): 30 min  Charges:  $Therapeutic  Activity: 23-37 mins                 Ramond Dial 09/09/2021, 5:43 PM  Mee Hives, PT MS Acute Rehab Dept. Number: [place]

## 2021-09-10 DIAGNOSIS — Z7189 Other specified counseling: Secondary | ICD-10-CM

## 2021-09-10 LAB — RENAL FUNCTION PANEL
Albumin: 2.4 g/dL — ABNORMAL LOW (ref 3.5–5.0)
Anion gap: 11 (ref 5–15)
BUN: 62 mg/dL — ABNORMAL HIGH (ref 8–23)
CO2: 20 mmol/L — ABNORMAL LOW (ref 22–32)
Calcium: 8.5 mg/dL — ABNORMAL LOW (ref 8.9–10.3)
Chloride: 103 mmol/L (ref 98–111)
Creatinine, Ser: 5.8 mg/dL — ABNORMAL HIGH (ref 0.44–1.00)
GFR, Estimated: 8 mL/min — ABNORMAL LOW (ref >60)
Glucose, Bld: 99 mg/dL (ref 70–99)
Phosphorus: 7 mg/dL — ABNORMAL HIGH (ref 2.5–4.6)
Potassium: 4.9 mmol/L (ref 3.5–5.1)
Sodium: 134 mmol/L — ABNORMAL LOW (ref 135–145)

## 2021-09-10 LAB — CBC
HCT: 27.4 % — ABNORMAL LOW (ref 36.0–46.0)
Hemoglobin: 8.4 g/dL — ABNORMAL LOW (ref 12.0–15.0)
MCH: 27.8 pg (ref 26.0–34.0)
MCHC: 30.7 g/dL (ref 30.0–36.0)
MCV: 90.7 fL (ref 80.0–100.0)
Platelets: 339 10*3/uL (ref 150–400)
RBC: 3.02 MIL/uL — ABNORMAL LOW (ref 3.87–5.11)
RDW: 16.9 % — ABNORMAL HIGH (ref 11.5–15.5)
WBC: 14.7 10*3/uL — ABNORMAL HIGH (ref 4.0–10.5)
nRBC: 0 % (ref 0.0–0.2)

## 2021-09-10 LAB — GLUCOSE, CAPILLARY
Glucose-Capillary: 104 mg/dL — ABNORMAL HIGH (ref 70–99)
Glucose-Capillary: 93 mg/dL (ref 70–99)
Glucose-Capillary: 126 mg/dL — ABNORMAL HIGH (ref 70–99)
Glucose-Capillary: 116 mg/dL — ABNORMAL HIGH (ref 70–99)

## 2021-09-10 MED ORDER — SODIUM CHLORIDE 0.9 % IV SOLN
25.0000 mg | Freq: Once | INTRAVENOUS | Status: AC
  Administered 2021-09-10: 25 mg via INTRAVENOUS
  Filled 2021-09-10: qty 2

## 2021-09-10 NOTE — Progress Notes (Signed)
Nancy [name] is a 60 year old female with a past medical history of HFpEF, CKD Stage IV, TIA, and Type II DM who presented with left foot pain and was admitted for a left gangrenous heel ulcer secondary to diabetic neuropathy.  Subjective:  No acute concerns or events overnight.  Patient denies SOB, chest pain, foot pain. She has not been able to walk with PT, she feels too unsteady on her feet. She states she can attend all follow up appointments, her husband can drive her. We discussed her kidney function and the possible need for HD in the future. Patient would be amendable to having a GOC discussion with palliative to discuss what her future wishes would be concerning dialysis and the possible future need for foot amputation if her infection were to worsen. When asked if she would consider low intensity rehab placement prior to discharge home, patient declined, states she wants to go home. When asked if she wants to go home today versus having [place] discussion she stated she would like to stay to have that discussion.   Objective:  Vital signs in last 24 hours: Vitals:   09/09/21 0806 09/09/21 1300 09/09/21 2049 09/10/21 0800  BP: (!) 183/79 (!) 180/79 (!) 162/89 (!) 162/86  Pulse: 67 68 69 71  Resp:  18 18 16   Temp:  98.2 F (36.8 C) 98.5 F (36.9 C) 98.9 F (37.2 C)  TempSrc:  Oral Oral   SpO2: 98% 99% 99% 100%  Weight:      Height:       Weight change:   Intake/Output Summary (Last 24 hours) at 09/10/2021 1610 Last data filed at 09/10/2021 9604 Gross per 24 hour  Intake 240 ml  Output 800 ml  Net -560 ml    Physical Exam: General: Obese african [place] female, NAD HENT: normocephalic, atraumatic, ptosis chronic EYES: conjunctiva non-erythematous, no scleral icterus CV: regular rate, normal rhythm, no murmurs, rubs, gallops, 2+ pitting edema to thigh Pulmonary: sating well on RA, lungs clear to auscultation Abdominal: non-distended, soft, non-tender to  palpation, normal BS Skin: Warm and dry, no rashes or lesions, Prevalon boots in place Neurological: MS: awake, alert and oriented x3, patient lacks insight into medical situation, responding more promptly today with less delay. Motor: moves all extremities antigravity Psych: normal affect   Assessment/Plan:  Active Problems:   AKI (acute kidney injury) ([place])   Diabetic foot ulcer (HCC)   Cellulitis of left foot   Gangrene of left foot [facility])  Nancy [name] is a 60 year old female with a past medical history of HFpEF with a pertinent family history of CHF in both parents, CKD Stage IV, TIA, and Type II DM who presented with left foot pain and was admitted for a left heel ulcer secondary to diabetic neuropathy.  #Gangrenous Diabetic Foot Ulcer, heel of left foot #Diabetic Foot Ulcer, lateral L foot #Left and Right Dorsal Foot Skin Lacerations #PAD Patient has a diabetic foot ulcers 2/2 diabetic neuropathy and is uncertain how long they have been present.  Difficulty with ambulation due to pain. MRI without evidence of osteomyelitis.  Likely no one has inspected the feet at follow-up appointments or at home since last admission.  Patient presented to the ED on 09/19, and was given broad-spec abx for gangrenous ulcer and concern for OM. ABIs completed, and the patient has moderate bilateral LE arterial disease.  Treated with IV antibiotics.  Vascular and Ortho recommend transtibial amputation.  Patient declined.  We will pursue  wound care outpatient and medical management.  Transitioned from IV antibiotics to p.o. antibiotics on 9/23. - Vascular surgery following, appreciate recommendations -Orthopedics consulted, appreciate recommendations -Continue aspirin 81 mg daily, Lipitor 40 mg daily. -Blood cultures NGTD -Day 7 of antibiotics: Currently on doxycycline p.o. and Augmentin p.o., continue antibiotics at discharge - Oxycodone 6mg  tablet q6 PRN and tylenol 650mg  q6 PRN for pain -  Wound care consulted, appreciate recommendations      - Twice daly iodine application to all wounds on both feet      - Prevalon heel lift boots -Amb referral to wound care -Take Prevalon boots home and continue to use for heel offloading -Patient declining SNF, patient uninsured, was receiving disability, is Medicaid pending however this can take 2 years unless patient is an ESRD patient which patient does not qualify for at this time. Patient unlikely to get insurance coverage for SNF or HH at this time.  -Palliative care consult -SLP cognitive eval   #Acute on chronic HFpEF exacerbation Patient presented with lower extremity edema concerning for acute on chronic HFpEF exacerbation, which has improved with lasix. Patient had echo 06/22 with EF of 55 to 60%.  -Torsemide 20 mg twice daily - Continue coreg 12.5 mg BID - Strict I/O's - Daily weight   #AKI on CKD Stage V #Oliguria #Anemia of CKD Patient has progressive renal insufficiency likely secondary to diabetic nephropathy. Patient had worsening kidney function this admission with creatinine 5.2 on 9/21.  UA without concerns for infection or casts.  Urine sodium <10.  AKI possibly due to cardiorenal type I, though BUN/CR more consistent with intrinsic kidney damage.  Renal ultrasound normal.  PTH elevated 113.  Patient continues to have oliguria. -Nephrology consulted, appreciate recs -No indication for dialysis at this time -Restart home torsemide for volume control -Calcium phosphate binders -Darbepoetin alfa every 4 weeks -PO iron + IV iron one dose, low concern for bacteremia - Strict I/O's - Avoid contrast to kidney or nephrotoxic agents, avoid ACE inhibitors, ARB's, NSAIDs -Patient will need AV fistula placement at some point, declining at this time -Outpatient follow-up with nephrologist (has followed with Dr. Hollie Salk)   #Poorly Controlled Type II DM #Diabetic neuropathy Patient monitors her blood sugar once daily before  meals, and her home sugars have been between 178-197. Most recent A1c is 6.8. Blood sugars appropriate this admission. - Continue lantus and humalog + SSI - Continue home duloxetine 20 mg daily   #History of tactile hallucinations - Patient was continued on risperidone 3 mg nightly  #HTN Patient's blood pressure medications: Amlodipine 5 mg daily, carvedilol 12.5 mg twice daily, were continued.  Home medications continued this admission: Cymbalta 20 mg daily   Best Practice: Diet: Heart healthy diet IVF: None VTE: Heparin 5,000 units Code: Full AB: Doxy + Augmentin both p.o.  Dispo: Admit patient to Inpatient with expected length of stay greater than 2 midnights.   LOS: 5 days   Wayland Denis, MD 09/10/2021, 8:35 AM Pager: [phone] If after 5 PM on weekdays, or on weekends please page: [phone]

## 2021-09-10 NOTE — Consult Note (Signed)
Consultation Note Date: 09/10/2021   Patient Name: Nancy [name]  DOB: 28-Sep-1961  MRN: [id number]  Age / Sex: 60 y.o., female  PCP: Nancy Pier, MD Referring Physician: Sid Falcon, MD  Reason for Consultation: Establishing goals of care  HPI/Patient Profile: 60 y.o. female  with past medical history of Diastolic CHF, CKD Stage IV, TIA, and Type II DM with diabetic neuropathy, anemia of CKD, HTN, HLD admitted on 09/04/2021 with left heel ulcer.  Patient has been admitted 4 times in the past 6 months. Palliative medicine has been consulted to assist with goals of care conversation.  Clinical Assessment and Goals of Care:  I have reviewed medical records including EPIC notes, labs and imaging, received report from RN, assessed the patient and then called her husband to discuss diagnosis prognosis, GOC, EOL wishes, disposition and options.  I introduced Palliative Medicine as specialized medical care for people living with serious illness. It focuses on providing relief from the symptoms and stress of a serious illness. The goal is to improve quality of life for both the patient and the family.  We discussed a brief life review of the patient and then focused on their current illness. Nancy [name] and Nancy [name] live together and have been married for 22 years. She walks with a cane/walker at baseline, although endorses worsening of her weakness recently. She is minimally conversing today, although she will answer with brief yes/no responses and seek clarification at times. She confirms that she has received thorough updates from the medical team and has an understanding of her current illness. She confirms that she does not want surgical amputation or SNF placement and would not want dialysis if needed in the future. Declines to discuss her thoughts and feelings on this any further. Shared my concern that  her illness may worsen despite medical management and she nods in understanding. She then gives me permission to call her husband for further discussion.  Patient's husband shares that he is very concerned for her safety and he is unable to care for her at home at this time. His priority is supporting her in her decisions, such as declining AKA and HD, while arranging for short-term SNF placement and eventually additional caregiver support in the home. They are a family of faith and he is very hopeful for her improvement. I emphasized the importance of preparing for best case and worst case scenarios while also hoping and praying for improvement. Counseled that if her infection and kidney function do not improve or worsens despite medical management, hospice would then be an appropriate alternative to interventions such as surgery and HD. Nancy [name] verbalizes his understanding and he is clear this is not currently an option they wish to consider.  I attempted to elicit values and goals of care important to the patient.    The difference between aggressive medical intervention and comfort care was considered in light of the patient's goals of care.   Advanced directives, concepts specific to code status, artifical feeding and hydration, and rehospitalization  were considered and discussed.  Hospice and Palliative Care services outpatient were explained and offered.  Discussed the importance of continued conversation with family and the medical providers regarding overall plan of care and treatment options, ensuring decisions are within the context of the patient's values and GOCs.    Questions and concerns were addressed. The family was encouraged to call with questions or concerns.  PMT will continue to support holistically.   PATIENT is primary Media planner. Next of kin is husband Nancy [name]. No HCPOA on file.    SUMMARY OF RECOMMENDATIONS   -Full code -Continue medical management (patient declines  AKA) -Encouraged husband to discuss disposition further with patient - he is unable to take her home and wants SNF while she adamantly refuses SNF during our conversation -Patient would not want HD, declines to discuss further today -Will follow up tomorrow as patient's participation allows  Code Status/Advance Care Planning: Full code   Palliative Prophylaxis:  Delirium Protocol  Additional Recommendations (Limitations, Scope, Preferences): No Hemodialysis and No Surgical Procedures  Psycho-social/Spiritual:  Desire for further Chaplaincy support:tbd Additional Recommendations: Caregiving  Support/Resources, Education on Hospice, Medicaid/Financial Assistance, and Referral to [employer]   Prognosis:  Unable to determine  Discharge Planning: To Be Determined      Primary Diagnoses: Present on Admission:  Diabetic foot ulcer ([place])   I have reviewed the medical record, interviewed the patient and family, and examined the patient. The following aspects are pertinent.  Past Medical History:  Diagnosis Date   Anemia 2006   Depression 2014   previously on amitryptiline    Diabetes mellitus with neurological manifestation ([place]) 2006   Diabetic peripheral neuropathy ([place]) 04/26/2020   Fracture of left ankle 1997    Gastroparesis 07/2016   HOH (hard of hearing) 2004    Hyperlipidemia 2006   Hypertension 2006   IBS (irritable bowel syndrome) 2002    Leukopenia 2015    Macular degeneration 11/2019   Shingles 2009    Stroke [facility]) 2020   Thyroid nodule 2004   Social History   Socioeconomic History   Marital status: Legally Separated    Spouse name: Nancy [name]   Number of children: 1   Years of education: some colle   Highest education level: Not on file  Occupational History   Occupation: Employed FT as Landscape architect    Comment: [employer] , [place]  Tobacco Use   Smoking status: Former    Packs/day: 0.25    Years: 0.50    Pack years: 0.13    Types: Cigarettes    Smokeless tobacco: Never   Tobacco comments:    quit yrs ago  Vaping Use   Vaping Use: Never used  Substance and Sexual Activity   Alcohol use: No   Drug use: No   Sexual activity: Yes    Birth control/protection: None  Other Topics Concern   Not on file  Social History Narrative   02/18/20 lives alone     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    Social Determinants of Health   Financial Resource Strain: High Risk   Difficulty of Paying Living Expenses: Hard  Food Insecurity: Food Insecurity Present   Worried About Charity fundraiser in the Last Year: Sometimes true   Ran Out of Food in the Last Year: Sometimes true  Transportation Needs: No Transportation Needs   Lack of Transportation (Medical): No   Lack of Transportation (Non-Medical): No  Physical Activity: Not on  file  Stress: Not on file  Social Connections: Not on file   Family History  Problem Relation Age of Onset   Hypertension Mother    Heart disease Mother    Diabetes Mother    Thyroid disease Mother    Congestive Heart Failure Mother    Breast cancer Maternal Grandmother    Colon cancer Maternal Grandfather    Heart attack Sister    Heart disease Brother    Hyperlipidemia Brother    Hypertension Brother    Diabetes Father    Breast cancer Maternal Aunt    Scheduled Meds:  amLODipine  5 mg Oral Daily   amoxicillin-clavulanate  1 tablet Oral Q12H   aspirin  81 mg Oral Daily   atorvastatin  40 mg Oral Daily   calcium acetate  667 mg Oral TID WC   carvedilol  12.5 mg Oral BID   darbepoetin (ARANESP) injection - NON-DIALYSIS  40 mcg Subcutaneous Q30 days   doxycycline  100 mg Oral Q12H   DULoxetine  20 mg Oral Daily   heparin  5,000 Units Subcutaneous Q8H   insulin aspart  0-15 Units Subcutaneous TID WC   iron polysaccharides  150 mg Oral Q24H   risperiDONE  3 mg Oral QHS   senna-docusate  1 tablet Oral Daily   torsemide  20 mg Oral BID   Continuous  Infusions: PRN Meds:.acetaminophen, oxyCODONE Medications Prior to Admission:  Prior to Admission medications   Medication Sig Start Date End Date Taking? Authorizing Provider  acetaminophen (TYLENOL) 500 MG tablet Take 500 mg by mouth every 6 (six) hours as needed for moderate pain or headache.   Yes [provider]  amLODipine (NORVASC) 5 MG tablet Take 1 tablet (5 mg total) by mouth daily. 07/22/21  Yes Joette Catching, PA-C  aspirin EC 81 MG tablet Take 1 tablet (81 mg total) by mouth daily. 11/05/19  Yes Nancy Pier, MD  atorvastatin (LIPITOR) 40 MG tablet Take 1 tablet (40 mg total) by mouth daily. 07/22/21  Yes Joette Catching, PA-C  carvedilol (COREG) 12.5 MG tablet Take 1 tablet (12.5 mg total) by mouth 2 (two) times daily with a meal. 07/21/21  Yes Joette Catching, PA-C  DULoxetine (CYMBALTA) 20 MG capsule TAKE 1 CAPSULE (20 MG TOTAL) BY MOUTH DAILY. 02/06/21 02/06/22 Yes Eulis Canner E, NP  ferrous sulfate 325 (65 FE) MG tablet Take 1 tablet (325 mg total) by mouth daily with breakfast. 08/01/21  Yes Nancy Pier, MD  insulin glargine (LANTUS) 100 UNIT/ML Solostar Pen Inject 55 Units into the skin at bedtime. 06/30/21  Yes Nancy Pier, MD  insulin lispro (HUMALOG KWIKPEN) 100 UNIT/ML KwikPen Inject 4 units before breakfast and dinner. Patient taking differently: Inject 4 Units into the skin every evening. 07/05/20  Yes Minette Brine, Amy J, NP  risperiDONE (RISPERDAL) 3 MG tablet TAKE 1 TABLET (3 MG TOTAL) BY MOUTH AT BEDTIME. 02/06/21 02/06/22 Yes Eulis Canner E, NP  torsemide (DEMADEX) 20 MG tablet Take 2 tablets (40 mg total) by mouth daily. 07/26/21  Yes Barrett, Evelene Croon, PA-C  amoxicillin-clavulanate (AUGMENTIN) 250-125 MG tablet Take 1 tablet by mouth every 12 (twelve) hours for 21 days. 09/09/21 09/30/21  Rick Duff, MD  Blood Glucose Monitoring Suppl [facility] VERIO) w/Device KIT Check blood sugar three times daily. E11.40 05/06/20    Nancy Pier, MD  Blood Glucose Monitoring Suppl (TRUE METRIX METER) w/Device KIT Check blood sugar three times daily. E11.40 07/11/20   Wynetta Emery,  Dalbert Batman, MD  calcium acetate (PHOSLO) 667 MG capsule Take 1 capsule (667 mg total) by mouth 3 (three) times daily with meals. 09/09/21 10/09/21  Rick Duff, MD  doxycycline (VIBRA-TABS) 100 MG tablet Take 1 tablet (100 mg total) by mouth every 12 (twelve) hours for 21 days. 09/09/21 09/30/21  Rick Duff, MD  glucose blood test strip Use as instructedCheck blood sugar three times daily. E11.40 07/11/20   Nancy Pier, MD  Insulin NPH Isophane & Regular (RELION 70/30 [place]) Inject 35 Units into the skin 2 (two) times daily.  08/17/14  [provider]   Allergies  Allergen Reactions   Elemental Sulfur Hives and Other (See Comments)    PATIENT STATED THIS, MORE THAN LIKELY, SHOULD HAVE BEEN LOGGED AS "SULFA"   Hydralazine Hcl Other (See Comments)    Hair loss   Hydrocodone Itching and Other (See Comments)    Upset stomach   Metformin And Related Nausea And Vomiting and Other (See Comments)    Stomach pains, also   Other Nausea Only and Other (See Comments)    Lettuce- Does not digest this!!   Plaquenil [Hydroxychloroquine Sulfate] Hives   Shellfish-Derived Products Nausea Only and Other (See Comments)    Caused an upset stomach   Shrimp (Diagnostic) Nausea Only and Other (See Comments)    Upset stomach    Sulfa Antibiotics Hives   Review of Systems  All other systems reviewed and are negative.  Physical Exam Vitals and nursing note reviewed.  Constitutional:      General: She is not in acute distress.    Appearance: She is ill-appearing.  Cardiovascular:     Rate and Rhythm: Normal rate.  Pulmonary:     Effort: Pulmonary effort is normal.  Neurological:     Mental Status: She is alert.  Psychiatric:        Mood and Affect: Affect is flat.        Behavior: Behavior is withdrawn.    Vital Signs: BP (!)  162/86 (BP Location: Right Arm)   Pulse 71   Temp 98.9 F (37.2 C)   Resp 16   Ht $R'5\' 4"'ti$  (1.626 m)   Wt 105.2 kg   SpO2 100%   BMI 39.82 kg/m  Pain Scale: 0-10 POSS *See Group Information*: S-Acceptable,Sleep, easy to arouse Pain Score: 0-No pain   SpO2: SpO2: 100 % O2 Device:SpO2: 100 % O2 Flow Rate: .   IO: Intake/output summary:  Intake/Output Summary (Last 24 hours) at 09/10/2021 1315 Last data filed at 09/10/2021 1006 Gross per 24 hour  Intake --  Output 1200 ml  Net -1200 ml    LBM: Last BM Date: 09/08/21 Baseline Weight: Weight: 105.2 kg Most recent weight: Weight: 105.2 kg     Palliative Assessment/Data: 50%     Time In: 11:30am Time Out: 12:40pm Time Total: 70 minutes Greater than 50% of this time was spent in counseling and coordinating care related to the above assessment and plan.  Dorthy Cooler, PA-C Palliative Medicine Team Team phone # [phone]  Thank you for allowing the Palliative Medicine Team to assist in the care of this patient. Please utilize secure chat with additional questions, if there is no response within 30 minutes please call the above phone number.  Palliative Medicine Team providers are available by phone from 7am to 7pm daily and can be reached through the team cell phone.  Should this patient require assistance outside of these hours, please call the patient's attending physician.

## 2021-09-10 NOTE — Progress Notes (Signed)
Subjective:   Patient denied any chest pain shortness of breath.  She felt her leg swelling was decreasing.  She endorsed understanding of the follow-up appointments that she needs to have.  Objective:  Vital signs in last 24 hours: Vitals:   09/09/21 0806 09/09/21 1300 09/09/21 2049 09/10/21 0800  BP: (!) 183/79 (!) 180/79 (!) 162/89 (!) 162/86  Pulse: 67 68 69 71  Resp:  18 18 16   Temp:  98.2 F (36.8 C) 98.5 F (36.9 C) 98.9 F (37.2 C)  TempSrc:  Oral Oral   SpO2: 98% 99% 99% 100%  Weight:      Height:       Gen: Nad CV: RRR, no m/r/g Pulm: Non labored breathing on RA, no wheezing or crackles Abd: Soft, NT, ND Ext: Feet with dressings, c/d/I, 2+ pitting edema to the knees bilaterally   Assessment/Plan:  Active Problems:   AKI (acute kidney injury) ([place])   Diabetic foot ulcer (H[facility])   Cellulitis of left foot   Gangrene of left foot (HCC)   #Gangrenous Diabetic foot ulcer of L heel  #Pad Patient has diabetic foot ulcers in the setting of diabetic neuropathy and is uncertain how long they have been present.  Difficulty with ambulation due to pain. MRI without evidence of osteomyelitis. Patient presented to the ED on 09/19, and was given broad-spec abx for gangrenous ulcer and concern for OM. ABIs completed, and the patient has moderate bilateral LE arterial disease.  Treated with IV antibiotics.  Vascular and Ortho recommend transtibial amputation given the low likelihood of her wound to heal.  Patient declining at this time and would like to try and salvage her left lower extremity with oral antibiotics and wound care. -Continue antibiotics and wound care   #Acute on chronic HFpEF exacerbation Patient presented with lower extremity edema concerning for acute on chronic HFpEF exacerbation, which has improved with lasix. Patient had echo 06/22 with EF of 55 to 60%.  She was started back on her home torsemide dose. -Continue torsemide and home coreg   #AKI on CKD stage  V, oliguria Patient has progressive renal insufficiency likely secondary to diabetic nephropathy. Patient had worsening kidney function this admission with creatinine 5.2 on 9/21.  UA without concerns for infection or casts.  Urine sodium <10.  AKI possibly due to cardiorenal type I, though BUN/CR more consistent with intrinsic kidney damage.  Renal ultrasound normal.  PTH elevated 113.  Patient continues to have oliguria. -Nephrology consulted this hospital admission and stated that patient would likely need AV fistula and hemodialysis.  This was discussed with patient and she is currently not interested in hemodialysis.  There was no urgent or emergent need for hemodialysis at this time.   -Continue home torsemide -Avoid nephrotoxic agents -F/u nephrology outpatient regarding this   #Anemia of CKD/Fe deficiency  -Patient also had anemia down to 6.9 she received 1 u of pRBCs, darbepoetin, and IV iron.  -Continue IV iron -Transfuse Hgb<7  #Poorly Controlled Type II DM #Diabetic neuropathy Patient monitors her blood sugar once daily before meals, and her home sugars have been between 178-197. Most recent A1c is 6.8. Her husband says she has been eating well in the hospital.   -Continue SSI while inpatient  -Continue duloxetine for neuropathic pain   #History of tactile hallucinations - Continue risperidone 3 mg nightly while inpatient.    #HTN Patient's blood pressure medications: Amlodipine 5 mg daily, carvedilol 12.5 mg twice daily, were continued.  -Normotensive, continue  home medications   Diet: Heart healthy diet IVF: None VTE: Heparin 5,000 units Code: Full AB: Doxy + Augmentin both p.o.   Dispo: Admit patient to Inpatient with expected length of stay greater than 2 midnights.  Rick Duff, MD 09/10/2021, 12:30 PM Pager: [phone] After 5pm on weekdays and 1pm on weekends: On Call pager [phone]   Note is back dated as patient did not discharge due to social factors.

## 2021-09-10 NOTE — Progress Notes (Signed)
[name] ROUNDING NOTE   Subjective:   Interval History: This is a 60 year old lady who has a history of chronic kidney disease stage IV, history of TIA type 2 diabetes and diastolic heart failure.  She presented with left heel ulcer secondary diabetic neuropathy.  She appears to have a history of chronic kidney disease.  With a creatinine in August of 4 mg/dL.  Renal ultrasound 09/07/2021 did not show any evidence of hydronephrosis.  Urinalysis shows no evidence of red blood cells white blood cells and has 100 mg/dL protein.  She was seen by nephrology August 2022.  She is a patient of Dr Ezequiel Essex. The thoughts are to continue dialogue regarding dialysis. At this point she is not agreeable for access placed in hospital. She stated that she is not interested in proceeding with dialysis  Blood pressure 162/86 pulse 71 temperature 98.9 O2 sats 90% room air.  Urine output 600 cc 09/09/2021 500 cc 09/10/2021  Renal panel pending.  Hemoglobin 8.4  Objective:  Vital signs in last 24 hours:  Temp:  [98.2 F (36.8 C)-98.9 F (37.2 C)] 98.9 F (37.2 C) (09/25 0800) Pulse Rate:  [68-71] 71 (09/25 0800) Resp:  [16-18] 16 (09/25 0800) BP: (162-180)/(79-89) 162/86 (09/25 0800) SpO2:  [99 %-100 %] 100 % (09/25 0800)  Weight change:  Filed Weights   09/04/21 1900 09/04/21 1930  Weight: 105.2 kg 105.2 kg    Intake/Output: I/O last 3 completed shifts: In: 360 [P.O.:360] Out: 1100 [Urine:1100]   Intake/Output this shift:  No intake/output data recorded.  General: Appears to be stable with no acute illness.  Nondistressed HEENT: Normocephalic atraumatic Eyes: Pupils round equal reactive Neck: Supple no thyromegaly adenopathy Heart: Regular rate and rhythm no murmurs rubs gallops Lungs: No wheeze or rales Abdomen: Soft nontender Extremities: Bilateral lower extremity edema Skin: Diabetic foot ulcer Neuro: Grossly intact   Basic Metabolic Panel: Recent Labs  Lab  09/04/21 1613 09/05/21 0650 09/06/21 0726 09/07/21 0058 09/07/21 1129 09/08/21 0732 09/09/21 0442  NA  --  133* 132* 131*  --  132* 131*  K  --  4.1 4.4 4.8  --  4.7 4.9  CL  --  101 100 98  --  101 100  CO2  --  22 18* 20*  --  18* 18*  GLUCOSE  --  119* 88 164*  --  89 142*  BUN  --  44* 48* 51*  --  57* 58*  CREATININE  --  4.14* 4.68* 5.20*  --  5.99* 6.19*  CALCIUM  --  8.5* 8.3* 8.7* 8.3* 8.2* 8.3*  MG 2.1 2.3  --  2.6*  --   --   --   PHOS 3.9  --   --   --   --  6.7* 7.0*     Liver Function Tests: Recent Labs  Lab 09/05/21 0650 09/09/21 0442  AST 9*  --   ALT 10  --   ALKPHOS 37*  --   BILITOT 0.7  --   PROT 6.5  --   ALBUMIN 2.5* 2.4*    No results for input(s): LIPASE, AMYLASE in the last 168 hours. No results for input(s): AMMONIA in the last 168 hours.  CBC: Recent Labs  Lab 09/04/21 1145 09/05/21 0650 09/07/21 0058 09/08/21 0732 09/08/21 1627 09/09/21 0442 09/10/21 0836  WBC 17.1* 12.3* 14.6* 13.9*  --  13.7* 14.7*  NEUTROABS 13.8*  --   --   --   --   --   --  HGB 8.2* 7.2* 8.4* 6.9* 8.0* 8.4* 8.4*  HCT 27.1* 23.2* 28.0* 23.0* 27.1* 27.0* 27.4*  MCV 91.9 92.4 93.0 93.5  --  91.5 90.7  PLT 317 279 365 306  --  318 339     Cardiac Enzymes: No results for input(s): CKTOTAL, CKMB, CKMBINDEX, TROPONINI in the last 168 hours.  BNP: Invalid input(s): POCBNP  CBG: Recent Labs  Lab 09/09/21 0800 09/09/21 1138 09/09/21 1632 09/09/21 2044 09/10/21 0757  GLUCAP 129* 107* 124* 132* 104*     Microbiology: Results for orders placed or performed during the hospital encounter of 09/04/21  Blood culture (routine x 2)     Status: None   Collection Time: 09/04/21 12:00 PM   Specimen: BLOOD  Result Value Ref Range Status   Specimen Description BLOOD LEFT ANTECUBITAL  Final   Special Requests   Final    BOTTLES DRAWN AEROBIC AND ANAEROBIC Blood Culture results may not be optimal due to an inadequate volume of blood received in culture bottles    Culture   Final    NO GROWTH 5 DAYS Performed at [facility], [place] [address] [place], [place] [address]    Report Status 09/09/2021 FINAL  Final  Blood culture (routine x 2)     Status: None   Collection Time: 09/04/21 12:06 PM   Specimen: BLOOD LEFT HAND  Result Value Ref Range Status   Specimen Description BLOOD LEFT HAND  Final   Special Requests   Final    BOTTLES DRAWN AEROBIC AND ANAEROBIC Blood Culture results may not be optimal due to an inadequate volume of blood received in culture bottles   Culture   Final    NO GROWTH 5 DAYS Performed at [facility], [place] [address] [place], [place] [address]    Report Status 09/09/2021 FINAL  Final  SARS CORONAVIRUS 2 (TAT 6-24 HRS) Nasopharyngeal Nasopharyngeal Swab     Status: None   Collection Time: 09/04/21  5:35 PM   Specimen: Nasopharyngeal Swab  Result Value Ref Range Status   SARS Coronavirus 2 NEGATIVE NEGATIVE Final    Comment: (NOTE) SARS-CoV-2 target nucleic acids are NOT DETECTED.  The SARS-CoV-2 RNA is generally detectable in upper and lower respiratory specimens during the acute phase of infection. Negative results do not preclude SARS-CoV-2 infection, do not rule out co-infections with other pathogens, and should not be used as the sole basis for treatment or other patient management decisions. Negative results must be combined with clinical observations, patient history, and epidemiological information. The expected result is Negative.  Fact Sheet for Patients: SugarRoll.be  Fact Sheet for Healthcare Providers: https://www.woods-mathews.com/  This test is not yet approved or cleared by the [place] FDA and  has been authorized for detection and/or diagnosis of SARS-CoV-2 by FDA under an Emergency Use Authorization (EUA). This EUA will remain  in effect (meaning this test can be used) for the duration of the COVID-19 declaration under Se ction  564(b)(1) of the Act, 21 U.S.C. section 360bbb-3(b)(1), unless the authorization is terminated or revoked sooner.  Performed at [facility], [place] [address], [place], [place] [address]     Coagulation Studies: No results for input(s): LABPROT, INR in the last 72 hours.  Urinalysis: No results for input(s): COLORURINE, LABSPEC, PHURINE, GLUCOSEU, HGBUR, BILIRUBINUR, KETONESUR, PROTEINUR, UROBILINOGEN, NITRITE, LEUKOCYTESUR in the last 72 hours.  Invalid input(s): APPERANCEUR     Imaging: No results found.   Medications:    ferric gluconate (  FERRLECIT/NULECIT) Test Dose      amLODipine  5 mg Oral Daily   amoxicillin-clavulanate  1 tablet Oral Q12H   aspirin  81 mg Oral Daily   atorvastatin  40 mg Oral Daily   calcium acetate  667 mg Oral TID WC   carvedilol  12.5 mg Oral BID   darbepoetin (ARANESP) injection - NON-DIALYSIS  40 mcg Subcutaneous Q30 days   doxycycline  100 mg Oral Q12H   DULoxetine  20 mg Oral Daily   heparin  5,000 Units Subcutaneous Q8H   insulin aspart  0-15 Units Subcutaneous TID WC   iron polysaccharides  150 mg Oral Q24H   risperiDONE  3 mg Oral QHS   senna-docusate  1 tablet Oral Daily   torsemide  20 mg Oral BID   acetaminophen, oxyCODONE  Assessment/ Plan:   1.Progressive renal insufficiency.  Probably secondary to diabetic nephropathy.  Has been evaluated and treated by nephrology in the past.  She will need outpatient follow-up with her nephrologist.  There are no absolute indications for dialysis at this point.  Continue to avoid nephrotoxins.  No ACE inhibitor's ARB's nonsteroidal anti-inflammatory drugs.  No IV contrast.  Renally adjust medications.  There does appear to have been some fairly drastic progression of her renal disease over the last 12 months.  This does not appear well. She does not want dialysis at this point . We shall continue the dialogue 2. Hypertension/volume  -appears to have some mild volume overload.  Would  continue home dose of diuretics. 3.  Anemia iron stores low.  I am avoiding iron at this present time due to diabetic ulcer.  Transfuse. 4.  Diabetic foot ulcer.  Followed by vascular surgery.  Really avoiding use of IV contrast due to CKD 5.   5.  Will need placement of AV fistula at some point.  She is already being followed by vascular surgery.   6.  Bone mineral.  We will add calcium phosphate binders.  PTH in goal.   LOS: [place] @TODAY @9 :38 AM

## 2021-09-10 NOTE — TOC Progression Note (Signed)
Transition of Care [facility]) - Progression Note    Patient Details  Name: Nancy [name] MRN: [id number] Date of Birth: 10-Aug-1961  Transition of Care [facility]) CM/SW Contact  Bartholomew Crews, RN Phone Number: [phone] 09/10/2021, 9:58 AM  Clinical Narrative:     Received call back from patient's spouse this morning. Spouse is concerned about patient being home alone while he works 3-11 Sunday to Thursday stating she would be home alone for 8.5 hours each day. Discussed cheching in with family, friends, or church to inquire about assistance. Discussed private pay home care services.   Patient is currently Medicaid pending. She does receive a disability check, but he not sure when her Medicare will kick in. 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   Patient did go to [facility] in the Spring 2021 where she received care for 1-2 months. Spouse is interested in patient receiving short-term skilled care in a nursing facility at this time.   Spouse confirmed that she has RW and 3/1 for DME.   Email sent to financial counselor to inquire about Medicaid or Medicare status.   TOC following for transition needs.   Expected Discharge Plan: [place] Barriers to Discharge: No [facility] will accept this patient  Expected Discharge Plan and Services Expected Discharge Plan: [place] In-house Referral: Clinical Social Work Discharge Planning Services: CM Consult Post Acute Care Choice: Home Health, Durable Medical Equipment Living arrangements for the past 2 months: Apartment Expected Discharge Date: 09/09/21               DME Arranged: N/A DME Agency: NA                   Social Determinants of Health ([place]) Interventions    Readmission Risk Interventions Readmission Risk Prevention Plan 03/07/2020  Transportation Screening Complete  PCP or Specialist Appt within 3-5 Days Complete  HRI or [place] (No  Data)  Social Work Consult for [place] Planning/Counseling [place] Not Applicable  Medication Review Press photographer) Complete  Some recent data might be hidden

## 2021-09-11 ENCOUNTER — Telehealth: Payer: Self-pay | Admitting: Orthopedic Surgery

## 2021-09-11 LAB — CBC
HCT: 26.6 % — ABNORMAL LOW (ref 36.0–46.0)
Hemoglobin: 8.2 g/dL — ABNORMAL LOW (ref 12.0–15.0)
MCH: 28.1 pg (ref 26.0–34.0)
MCHC: 30.8 g/dL (ref 30.0–36.0)
MCV: 91.1 fL (ref 80.0–100.0)
Platelets: 349 10*3/uL (ref 150–400)
RBC: 2.92 MIL/uL — ABNORMAL LOW (ref 3.87–5.11)
RDW: 17 % — ABNORMAL HIGH (ref 11.5–15.5)
WBC: 13.9 10*3/uL — ABNORMAL HIGH (ref 4.0–10.5)
nRBC: 0 % (ref 0.0–0.2)

## 2021-09-11 LAB — RENAL FUNCTION PANEL
Albumin: 2.3 g/dL — ABNORMAL LOW (ref 3.5–5.0)
Anion gap: 12 (ref 5–15)
BUN: 64 mg/dL — ABNORMAL HIGH (ref 8–23)
CO2: 19 mmol/L — ABNORMAL LOW (ref 22–32)
Calcium: 8.6 mg/dL — ABNORMAL LOW (ref 8.9–10.3)
Chloride: 103 mmol/L (ref 98–111)
Creatinine, Ser: 5.46 mg/dL — ABNORMAL HIGH (ref 0.44–1.00)
GFR, Estimated: 8 mL/min — ABNORMAL LOW (ref >60)
Glucose, Bld: 94 mg/dL (ref 70–99)
Phosphorus: 6.5 mg/dL — ABNORMAL HIGH (ref 2.5–4.6)
Potassium: 4.8 mmol/L (ref 3.5–5.1)
Sodium: 134 mmol/L — ABNORMAL LOW (ref 135–145)

## 2021-09-11 LAB — GLUCOSE, CAPILLARY
Glucose-Capillary: 156 mg/dL — ABNORMAL HIGH (ref 70–99)
Glucose-Capillary: 125 mg/dL — ABNORMAL HIGH (ref 70–99)
Glucose-Capillary: 116 mg/dL — ABNORMAL HIGH (ref 70–99)

## 2021-09-11 LAB — MAGNESIUM: Magnesium: 2.5 mg/dL — ABNORMAL HIGH (ref 1.7–2.4)

## 2021-09-11 NOTE — Progress Notes (Addendum)
Nancy [name] is a 60 year old female with a past medical history of HFpEF, CKD Stage V, TIA, and Type II DM who presented with left foot pain and was admitted for a gangrenous left heel ulcer secondary to diabetic neuropathy.  Subjective:  No reported events overnight. Patient believes her strength has improved from last week. She denies chest pain and pain in her feet. She continues declining rehab and says she feels ready to go home.  Objective:  Vital signs in last 24 hours: Vitals:   09/09/21 2049 09/10/21 0800 09/10/21 2207 09/11/21 0720  BP: (!) 162/89 (!) 162/86 (!) 153/64 (!) 168/81  Pulse: 69 71 73 74  Resp: $Remo'18 16 18 17  'rQOaZ$ Temp: 98.5 F (36.9 C) 98.9 F (37.2 C) 98.5 F (36.9 C) 98.9 F (37.2 C)  TempSrc: Oral  Oral Oral  SpO2: 99% 100% 93% 93%  Weight:      Height:       Weight change:   Intake/Output Summary (Last 24 hours) at 09/11/2021 1255 Last data filed at 09/11/2021 0600 Gross per 24 hour  Intake 120 ml  Output 300 ml  Net -180 ml    Physical Exam: General: Obese african [place] female, NAD HENT: normocephalic, atraumatic, ptosis chronic EYES: conjunctiva non-erythematous, no scleral icterus CV: regular rate, normal rhythm, no murmurs, rubs, gallops, 2+ pitting edema to thigh Pulmonary: sating well on RA, lungs clear to auscultation Abdominal: non-distended, soft, non-tender to palpation, normal BS Skin: Warm and dry, no rashes or lesions, Prevalon boots in place Neurological: MS: awake, alert and oriented x3, patient lacks insight into medical situation, responding more promptly today with less delay. Motor: moves all extremities antigravity Psych: normal affect  Assessment/Plan:  Active Problems:   AKI (acute kidney injury) ([place])   Diabetic foot ulcer (HCC)   Cellulitis of left foot   Gangrene of left foot (HCC)   Goals of care, counseling/discussion  Nancy [name] is a 60 year old female with a past medical history of HFpEF,  CKD Stage V, TIA, and Type II DM who presented with left foot pain and was admitted for a gangrenous left heel ulcer secondary to diabetic neuropathy.  #Gangrenous Diabetic Foot Ulcer, heel of left foot #Diabetic Foot Ulcer, lateral L foot #Left and Right Dorsal Foot Skin Lacerations #PAD Patient has a diabetic foot ulcers 2/2 diabetic neuropathy and is uncertain how long they have been present. She has had difficulty with ambulation due to pain. MRI w/o OM. Likely no one has inspected the feet at follow-up appointments or at home since last admission.  Patient presented to the ED on 09/19, and was given broad-spec abx for gangrenous ulcer and concern for OM. ABIs completed, and the patient has moderate bilateral LE arterial disease. Treated with IV antibiotics. Vascular and Ortho recommend transtibial amputation. Patient declined. We will pursue wound care outpatient and medical management. Transitioned from IV antibiotics to p.o. antibiotics on 9/23. Patient declined SNF placement, but her husband expressed concerns about caring for her at home. Palliative met with her and their goals of care were discussed.  Patient and husband would be agreeable with hospice at home if patient were to decline despite medical management. - Vascular surgery following, appreciate recommendations - Orthopedics consulted, appreciate recommendations -Palliative care consulted, appreciate recs - Continue aspirin 81 mg daily, Lipitor 40 mg daily. - Blood cultures NGTD - Day 8 of antibiotics: Currently on doxycycline p.o. and Augmentin p.o., continue antibiotics at discharge - Oxycodone $RemoveBef'6mg'WifMRvRhjp$  tablet q6 PRN and tylenol  $'650mg'j$  q6 PRN for pain - Wound care consulted, appreciate recommendations      -Reconsulted wound care for final recommendations. - Ambulatory referral to wound care - Take Prevalon boots home and continue to use for heel offloading - Patient declining SNF, patient uninsured, was receiving disability, is  Medicaid pending however this can take 90 days unless patient is an ESRD patient which patient does not qualify for at this time. Patient unlikely to get insurance coverage for SNF or [facility] at this time.  -Patient status now DNR -CM/SW working hard to try to obtain charity care home nursing for patient.  [place] pending decision. - SLP cognitive eval still pending   #Acute on chronic HFpEF exacerbation Patient presented with lower extremity edema concerning for acute on chronic HFpEF exacerbation, which has improved with lasix. Patient had echo 06/22 with EF of 55 to 60%.  - Torsemide 20 mg twice daily - Continue coreg 12.5 mg BID - Strict I/O's - Daily weight   #AKI on CKD Stage V #Oliguria #Anemia of CKD Patient has progressive renal insufficiency likely secondary to diabetic nephropathy. Patient had worsening kidney function this admission with creatinine 5.2 on 9/21. UA without concerns for infection or casts. Urine sodium <10. AKI possibly due to cardiorenal type I, though BUN/CR more consistent with intrinsic kidney damage. Renal ultrasound normal. PTH elevated 113. Patient continues to have oliguria. Nephrology believes her kidney function is stabilizing at CKD Stage V. - Nephrology consulted, appreciate recommendations - No indication for dialysis at this time - Restart home torsemide for volume control - Calcium phosphate binders - Darbepoetin alfa every 4 weeks - PO iron + IV iron one dose, low concern for bacteremia - Strict I/O's - Avoid contrast to kidney or nephrotoxic agents, avoid ACE inhibitors, ARB's, NSAIDs - Patient will need AV fistula placement at some point, declining at this time - Outpatient follow-up with nephrologist (has followed with Dr. Hollie Salk)   #Poorly Controlled Type II DM #Diabetic neuropathy Patient monitors her blood sugar once daily before meals, and her home sugars have been between 178-197. Most recent A1c is 6.8. Blood sugars appropriate  this admission. - Continue lantus and humalog + SSI - Continue home duloxetine 20 mg daily   #History of tactile hallucinations - Patient was continued on risperidone 3 mg nightly   #HTN Patient's blood pressure medications: Amlodipine 5 mg daily, carvedilol 12.5 mg twice daily, were continued.   Home medications continued this admission: Cymbalta 20 mg daily   Best Practice: Diet: Heart healthy diet IVF: None VTE: Heparin 5,000 units Code: Full AB: Doxy + Augmentin both p.o.   Dispo: Admit patient to Inpatient with expected length of stay greater than 2 midnights.     LOS: 6 days   Danie Chandler, Medical Student 09/11/2021, 12:55 PM  Attestation for Student Documentation:  I personally was present and performed or re-performed the history, physical exam and medical decision-making activities of this service and have verified that the service and findings are accurately documented in the student's note.  Wayland Denis, MD 09/11/2021, 1:49 PM

## 2021-09-11 NOTE — Progress Notes (Signed)
SLP Cancellation Note  Patient Details Name: Nancy [name] MRN: [id number] DOB: 03-22-1961   Cancelled treatment:       Reason Eval/Treat Not Completed: Patient at procedure or test/unavailable. Attempt x3 this am.    Lynann Beaver 09/11/2021, 11:12 AM

## 2021-09-11 NOTE — Progress Notes (Signed)
[facility] Progress Note   60 year old lady who has a history of chronic kidney disease stage IV, history of TIA type 2 diabetes and diastolic heart failure.  She presented with left heel ulcer secondary diabetic neuropathy.  She appears to have a history of chronic kidney disease with a creatinine in August of 4 mg/dL.  Renal ultrasound 09/07/2021 did not show any evidence of hydronephrosis.  Urinalysis shows no evidence of red blood cells white blood cells and has 100 mg/dL protein.  She was seen by nephrology August 2022.  She is a patient of Dr Ezequiel Essex. The thoughts are to continue dialogue regarding dialysis. At this point she is not agreeable for access placed in hospital. She stated that she is not interested in proceeding with dialysis  Assessment/ Plan:   1.Progressive renal insufficiency.  Probably secondary to diabetic nephropathy.  Has been evaluated and treated by nephrology in the past.  She will need outpatient follow-up with her nephrologist.  There are no absolute indications for dialysis at this point.  Continue to avoid nephrotoxins.  No ACE inhibitor's ARB's nonsteroidal anti-inflammatory drugs.  No IV contrast.  Renally adjust medications.  There does appear to have been some fairly drastic progression of her renal disease over the last 12 months.  I d/w patient again this AM and she is clear that she doesn't want dialysis at this time. I also subject if she would want RRT were her function to decline to the point of requiring dialysis. She decline RRT but I also wonder about her cognition and whether she is able to make that decision. Will defer to and appreciate palliative assistance. Will follow peripherally as fortunately renal function appears to have stabilized (CKD 5).  2. Hypertension/volume  -appears to have some mild volume overload.  Would continue home dose of diuretics. 3.  Anemia iron stores low.  I am avoiding iron at this present time due to diabetic  ulcer.  Transfuse. 4.  Diabetic foot ulcer.  Followed by vascular surgery.  Really avoiding use of IV contrast due to CKD 5.   5.  Will need placement of AV fistula at some point.  She is already being followed by vascular surgery.   6.  Bone mineral.  We started on Phoslo 1 tab TIDM.  PTH in goal.  Subjective:   Denies n/v/dyspnea/ CP/F/C/ pain.   Objective:   BP (!) 168/81 (BP Location: Right Arm)   Pulse 74   Temp 98.9 F (37.2 C) (Oral)   Resp 17   Ht 5\' 4"  (1.626 m)   Wt 105.2 kg   SpO2 93%   BMI 39.82 kg/m   Intake/Output Summary (Last 24 hours) at 09/11/2021 0729 Last data filed at 09/11/2021 0600 Gross per 24 hour  Intake 770 ml  Output 1550 ml  Net -780 ml   Weight change:   Physical Exam: General: NAD  HEENT: Normocephalic atraumatic Heart: Regular rate and rhythm no murmurs rubs gallops Lungs: No wheeze or rales Abdomen: Soft nontender Extremities: Bilateral lower extremity edema Skin: Diabetic foot ulcer Neuro: Grossly intact  Imaging: No results found.  Labs: BMET Recent Labs  Lab 09/04/21 1613 09/05/21 0650 09/06/21 0726 09/07/21 0058 09/07/21 1129 09/08/21 0732 09/09/21 0442 09/10/21 0836 09/11/21 0356  NA  --  133* 132* 131*  --  132* 131* 134* 134*  K  --  4.1 4.4 4.8  --  4.7 4.9 4.9 4.8  CL  --  101 100 98  --  101 100 103 103  CO2  --  22 18* 20*  --  18* 18* 20* 19*  GLUCOSE  --  119* 88 164*  --  89 142* 99 94  BUN  --  44* 48* 51*  --  57* 58* 62* 64*  CREATININE  --  4.14* 4.68* 5.20*  --  5.99* 6.19* 5.80* 5.46*  CALCIUM  --  8.5* 8.3* 8.7* 8.3* 8.2* 8.3* 8.5* 8.6*  PHOS 3.9  --   --   --   --  6.7* 7.0* 7.0* 6.5*   CBC Recent Labs  Lab 09/04/21 1145 09/05/21 0650 09/08/21 0732 09/08/21 1627 09/09/21 0442 09/10/21 0836 09/11/21 0356  WBC 17.1*   < > 13.9*  --  13.7* 14.7* 13.9*  NEUTROABS 13.8*  --   --   --   --   --   --   HGB 8.2*   < > 6.9* 8.0* 8.4* 8.4* 8.2*  HCT 27.1*   < > 23.0* 27.1* 27.0* 27.4* 26.6*  MCV  91.9   < > 93.5  --  91.5 90.7 91.1  PLT 317   < > 306  --  318 339 349   < > = values in this interval not displayed.    Medications:     amLODipine  5 mg Oral Daily   amoxicillin-clavulanate  1 tablet Oral Q12H   aspirin  81 mg Oral Daily   atorvastatin  40 mg Oral Daily   calcium acetate  667 mg Oral TID WC   carvedilol  12.5 mg Oral BID   darbepoetin (ARANESP) injection - NON-DIALYSIS  40 mcg Subcutaneous Q30 days   doxycycline  100 mg Oral Q12H   DULoxetine  20 mg Oral Daily   heparin  5,000 Units Subcutaneous Q8H   insulin aspart  0-15 Units Subcutaneous TID WC   iron polysaccharides  150 mg Oral Q24H   risperiDONE  3 mg Oral QHS   senna-docusate  1 tablet Oral Daily   torsemide  20 mg Oral BID      Otelia Santee, MD 09/11/2021, 7:29 AM

## 2021-09-11 NOTE — Telephone Encounter (Signed)
Received call from [facility] Internal Medicine Resident with La[facility] Penning. He asked if Dr Sharol Given will follow-up with patient for wound care. Jaci Standard advised patient  declined surgery. Jaci Standard advised patient is self pay right now. The number to contact Jaci Standard is [phone]

## 2021-09-11 NOTE — Progress Notes (Signed)
Daily Progress Note   Patient Name: Nancy [name]       Date: 09/11/2021 DOB: [birth year]  Age: 60 y.o. MRN#: [id number] Attending Physician: Sid Falcon, MD Primary Care Physician: [name], MD Admit Date: 09/04/2021  Reason for Consultation/Follow-up: Establishing goals of care  Subjective: Medical records reviewed. Patient assessed at the bedside. She is alert and oriented x3, denies pain or distress.   We discussed risks/benefits of code status, current treatment options, the difference between palliative care and hospice, and then reviewed comfort care and home hospice at length. [name] understands that she is at high risk for medical management of her wound infection to fail. She is still thinking about surgery, but Wednesday seems to be much too soon. She is still leaning towards declining this option. She is much more confident about her decision to decline dialysis if ever recommended in the future. She is slightly overwhelmed by the amount of updates and conversations she has had over the past week. Bee is open to palliative care support and finds it helpful to know that hospice is an option if her infection and/or kidney function worsen despite current efforts.  [name] then joined [place] at the bedside for a review of the above conversation. He feels strongly that there is an opportunity for God to heal her wound without surgical amputation. He also agrees with patient's decision to avoid HD, as his mother was on dialysis before she passed away. [name] shares that he has also had difficulty following updates from the different medical teams. After further clarification, he states that he would be agreeable with hospice at home if patient were to decline  despite medical management. He is not ready for this currently as he is still very hopeful for improvement. [name] tells me he has arranged assistance with supervision while he is at work and would be comfortable taking her home as soon as possible. We talked about PT recommendations and the importance of patient's safety.  Questions and concerns addressed. Hard Choices booklet provided for review. PMT will continue to support holistically.     Length of Stay: 6  Current Medications: Scheduled Meds:   amLODipine  5 mg Oral Daily   amoxicillin-clavulanate  1 tablet Oral Q12H   aspirin  81 mg Oral Daily   atorvastatin  40 mg Oral Daily   calcium acetate  667 mg Oral TID WC   carvedilol  12.5 mg Oral BID   darbepoetin (ARANESP) injection - NON-DIALYSIS  40 mcg Subcutaneous Q30 days   doxycycline  100 mg Oral Q12H   DULoxetine  20 mg Oral Daily   heparin  5,000 Units Subcutaneous Q8H   insulin aspart  0-15 Units Subcutaneous TID WC   iron polysaccharides  150 mg Oral Q24H   risperiDONE  3 mg Oral QHS   senna-docusate  1 tablet Oral Daily   torsemide  20 mg Oral BID    Continuous Infusions:   PRN Meds: acetaminophen, oxyCODONE  Physical Exam Vitals and nursing note reviewed.  Constitutional:      General: She is not in acute distress.    Appearance: She is obese.  Cardiovascular:     Rate and Rhythm: Normal rate.  Pulmonary:     Effort: Pulmonary effort is normal.  Skin:    General: Skin is warm and dry.  Neurological:     Mental Status: She is alert and oriented to person, place, and time.  Psychiatric:        Attention and Perception: Attention normal.        Mood and Affect: Mood normal.        Speech: Speech normal.        Behavior: Behavior is cooperative.            Vital Signs: BP (!) 168/81 (BP Location: Right Arm)   Pulse 74   Temp 98.9 F (37.2 C) (Oral)   Resp 17   Ht 5\' 4"  (1.626 m)   Wt 105.2 kg   SpO2 93%   BMI 39.82 kg/m  SpO2: SpO2: 93 % O2  Device: O2 Device: Room Air O2 Flow Rate:    Intake/output summary:  Intake/Output Summary (Last 24 hours) at 09/11/2021 1315 Last data filed at 09/11/2021 0600 Gross per 24 hour  Intake 120 ml  Output 300 ml  Net -180 ml   LBM: Last BM Date: 09/06/21 Baseline Weight: Weight: 105.2 kg Most recent weight: Weight: 105.2 kg       Palliative Assessment/Data: 50%      Patient Active Problem List   Diagnosis Date Noted   Goals of care, counseling/discussion    Gangrene of left foot (HCC)    Cellulitis of left foot    Diabetic foot ulcer ([place]) 09/04/2021   CHF (congestive heart failure) ([place]) 07/07/2021   Normocytic anemia 06/30/2021   CKD (chronic kidney disease), stage IV (HCC) 78/58/8502   Acute diastolic CHF (congestive heart failure) ([place]) 06/10/2021   Abnormal nuclear stress test    Dyspnea on exertion 03/07/2021   Orthopnea 02/25/2021   History of cerebrovascular accident (CVA) with residual deficit 05/06/2020   Diabetic peripheral neuropathy ([place]) 04/26/2020   AKI (acute kidney injury) ([place]) 04/20/2020   Generalized weakness 04/20/2020   Dehydration 04/20/2020   Generalized abdominal pain    Pancreatitis, acute 02/29/2020   Cerebrovascular accident (CVA) ([place]) 11/08/2019   Stenosis of right carotid artery 11/08/2019   Hyperlipidemia associated with type 2 diabetes mellitus ([place]) 11/08/2019   Cortical age-related cataract of both eyes 08/30/2019   Gait abnormality 08/30/2019   Statin declined 08/30/2019   Gastroesophageal reflux disease without esophagitis 04/18/2018   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   Uncontrolled type 2 diabetes mellitus with diabetic neuropathy, with long-term current use of insulin ([place]) 10/29/2017   History of macular degeneration 10/29/2017   Chronic  cervical pain 10/29/2016   Myalgia 09/14/2016   Insomnia 09/14/2016   Tinea pedis of both feet 08/20/2016   Macular degeneration 08/17/2016   Low back pain 09/21/2014   Hypertension 09/21/2014    Diabetic gastroparesis associated with type 2 diabetes mellitus ([place]) 09/14/2014   Thyroid nodule 08/17/2014   Obesity (BMI 30-39.9) 08/17/2014   Hyperlipidemia 08/05/2014   Nausea & vomiting 08/04/2014   Type II diabetes mellitus with neurological manifestations, uncontrolled ([place]) 08/04/2014    Palliative Care Assessment & Plan   Patient Profile: 60 y.o. female  with past medical history of Diastolic CHF, CKD Stage IV, TIA, and Type II DM with diabetic neuropathy, anemia of CKD, HTN, HLD admitted on 09/04/2021 with left heel ulcer.   Patient has been admitted 4 times in the past 6 months. Palliative medicine has been consulted to assist with goals of care conversation.    Assessment: AKI on CKD4 Diabetic foot ulcer, left Gangrene of left foot Goals of care conversation  Recommendations/Plan: DNR - will place gold form in patient's hard chart for discharge Continue medical management - patient will likely decline surgery and certainly never consider dialysis in the future. She would prefer to transition to home hospice if she declines despite treatment with antibiotics/wound care Patient's husband feels ready to safety take her home today or tomorrow Psychosocial and emotional support provided [facility] assistance appreciated with family's need for resources, patient is agreeable to OP PC if able to afford with medicaid pending PMT is available as needed and family is encouraged to call for acute needs   Goals of Care and Additional Recommendations: Limitations on Scope of Treatment: No Hemodialysis and No Surgical Procedures   Prognosis:  Unable to determine  Discharge Planning: Home with Palliative Services  Care plan was discussed with patient, patient's husband, IM TS, TOC   Time in: 10:25am Time out: 11:30am Prolonged time: Yes Total time: 65 minutes  Greater than 50% of this time was spent in counseling and coordinating care related to the above assessment and  plan.  Dorthy Cooler, PA-C Palliative Medicine Team Team phone # [phone]  Thank you for allowing the Palliative Medicine Team to assist in the care of this patient. Please utilize secure chat with additional questions, if there is no response within 30 minutes please call the above phone number.  Palliative Medicine Team providers are available by phone from 7am to 7pm daily and can be reached through the team cell phone.  Should this patient require assistance outside of these hours, please call the patient's attending physician.

## 2021-09-11 NOTE — Telephone Encounter (Signed)
Dr. Sharol Given called and sw resident to advise that he can not follow for wound care as she has a risk of becoming septic and putting her life in danger. Encourage resident to reach out and advise pt that we have services that we can provide her care after surgery and that she will not be alone in her post operative journey.

## 2021-09-11 NOTE — Progress Notes (Signed)
Patient ID: KAISLEE CHAO, female   DOB: February 27, 1961, 60 y.o.   MRN: [id number] Patient is seen in follow-up for gangrenous ulcer left heel.  Patient has foul-smelling drainage at this time.  Her brother was at bedside.  Recommended proceeding with a left below the knee amputation.  Patient's mother also underwent a below-knee amputation.  Patient states that she does not feel like she has enough help to proceed with caring for a below the knee amputation.  Discussed with her the risk of systemic infection and consequences of the systemic infection if she did not proceed with amputation.  Patient states she understands she is not convinced that she wants to proceed with a surgery at this time.  I will post surgery for Wednesday and will follow-up on Tuesday.

## 2021-09-11 NOTE — Consult Note (Addendum)
[place] Nurse Consult Note: Patient receiving care in [facility]N18 Reason for Consult: BL foot wounds Wound type: Horizontal lesions MDRPI (wearing compression socks for a long period of time) on the anterior aspect of both feet that have a yellow surrounding border with dried, crusted skin in the center of the wounds.  Right heel is a superficial ulcer. Left heel is an unstageable PI that has a yellow/pink border and fluid that is felt under the eschar. Per Dr. Sharol Given she has wet gangrenous ulcer involving the entire left heel pad with severe peripheral vascular disease. It is recommended that she have a BKA of this LLE but she is not ready yet.  I will recommend at this time that both LE are to be cleansed thoroughly with soap and water, rinsed and pat dry. Apply a generous coat of Sween moisturizing lotion to the lower legs and to the feet except for the open wounds.  Continue the betadine application to the left heel and allow to air dry. Apply a small piece of Aquacel Advantage over the anterior aspect of both feet. Wrap bilateral LE from the tip of the toes to below the knees with Kerlix and then replace Prevalon boots. Sween moisturizing lotion will not allow foam dressings to stick. Change daily.   Monitor the wound area(s) for worsening of condition such as: Signs/symptoms of infection, increase in size, development of or worsening of odor, development of pain, or increased pain at the affected locations.   Notify the medical team if any of these develop.  Thank you for the consult. [place] nurse will not follow at this time.   Please re-consult the [place] team if needed.  Cathlean Marseilles Tamala Julian, MSN, RN, [place], Lysle Pearl, [facility] Wound Treatment Associate Pager [phone]

## 2021-09-12 LAB — CBC
HCT: 27.8 % — ABNORMAL LOW (ref 36.0–46.0)
Hemoglobin: 8.6 g/dL — ABNORMAL LOW (ref 12.0–15.0)
MCH: 27.8 pg (ref 26.0–34.0)
MCHC: 30.9 g/dL (ref 30.0–36.0)
MCV: 90 fL (ref 80.0–100.0)
Platelets: 351 10*3/uL (ref 150–400)
RBC: 3.09 MIL/uL — ABNORMAL LOW (ref 3.87–5.11)
RDW: 16.7 % — ABNORMAL HIGH (ref 11.5–15.5)
WBC: 16.1 10*3/uL — ABNORMAL HIGH (ref 4.0–10.5)
nRBC: 0.1 % (ref 0.0–0.2)

## 2021-09-12 LAB — RENAL FUNCTION PANEL
Albumin: 2.3 g/dL — ABNORMAL LOW (ref 3.5–5.0)
Anion gap: 9 (ref 5–15)
BUN: 63 mg/dL — ABNORMAL HIGH (ref 8–23)
CO2: 22 mmol/L (ref 22–32)
Calcium: 8.7 mg/dL — ABNORMAL LOW (ref 8.9–10.3)
Chloride: 104 mmol/L (ref 98–111)
Creatinine, Ser: 4.84 mg/dL — ABNORMAL HIGH (ref 0.44–1.00)
GFR, Estimated: 10 mL/min — ABNORMAL LOW (ref >60)
Glucose, Bld: 114 mg/dL — ABNORMAL HIGH (ref 70–99)
Phosphorus: 6 mg/dL — ABNORMAL HIGH (ref 2.5–4.6)
Potassium: 4.8 mmol/L (ref 3.5–5.1)
Sodium: 135 mmol/L (ref 135–145)

## 2021-09-12 LAB — GLUCOSE, CAPILLARY
Glucose-Capillary: 100 mg/dL — ABNORMAL HIGH (ref 70–99)
Glucose-Capillary: 164 mg/dL — ABNORMAL HIGH (ref 70–99)
Glucose-Capillary: 114 mg/dL — ABNORMAL HIGH (ref 70–99)
Glucose-Capillary: 106 mg/dL — ABNORMAL HIGH (ref 70–99)

## 2021-09-12 LAB — PARATHYROID HORMONE, INTACT (NO CA): PTH: 83 pg/mL — ABNORMAL HIGH (ref 15–65)

## 2021-09-12 MED ORDER — ENSURE ENLIVE PO LIQD
237.0000 mL | Freq: Two times a day (BID) | ORAL | Status: DC
  Administered 2021-09-13 – 2021-09-21 (×3): 237 mL via ORAL

## 2021-09-12 NOTE — Progress Notes (Signed)
Orthopedic Tech Progress Note Patient Details:  Nancy [name] 11-Apr-1961 [id number]  Called in order to [employer] for a [facility] PRAFO BOOT   Patient ID: Nancy [name], female   DOB: 12/28/1960, 60 y.o.   MRN: [id number]  [name] 09/12/2021, 10:06 AM

## 2021-09-12 NOTE — Evaluation (Signed)
Speech Language Pathology Evaluation Patient Details Name: Nancy [name] MRN: [id number] DOB: [birth year] Today's Date: 09/12/2021 Time:  -     Problem List:  Patient Active Problem List   Diagnosis Date Noted   Goals of care, counseling/discussion    Gangrene of left foot ([place])    Cellulitis of left foot    Diabetic foot ulcer ([place]) 09/04/2021   CHF (congestive heart failure) ([place]) 07/07/2021   Normocytic anemia 06/30/2021   CKD (chronic kidney disease), stage IV (HCC) 40/98/1191   Acute diastolic CHF (congestive heart failure) ([place]) 06/10/2021   Abnormal nuclear stress test    Dyspnea on exertion 03/07/2021   Orthopnea 02/25/2021   History of cerebrovascular accident (CVA) with residual deficit 05/06/2020   Diabetic peripheral neuropathy ([place]) 04/26/2020   AKI (acute kidney injury) ([place]) 04/20/2020   Generalized weakness 04/20/2020   Dehydration 04/20/2020   Generalized abdominal pain    Pancreatitis, acute 02/29/2020   Cerebrovascular accident (CVA) ([place]) 11/08/2019   Stenosis of right carotid artery 11/08/2019   Hyperlipidemia associated with type 2 diabetes mellitus ([place]) 11/08/2019   Cortical age-related cataract of both eyes 08/30/2019   Gait abnormality 08/30/2019   Statin declined 08/30/2019   Gastroesophageal reflux disease without esophagitis 04/18/2018   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   Uncontrolled type 2 diabetes mellitus with diabetic neuropathy, with long-term current use of insulin ([place]) 10/29/2017   History of macular degeneration 10/29/2017   Chronic cervical pain 10/29/2016   Myalgia 09/14/2016   Insomnia 09/14/2016   Tinea pedis of both feet 08/20/2016   Macular degeneration 08/17/2016   Low back pain 09/21/2014   Hypertension 09/21/2014   Diabetic gastroparesis associated with type 2 diabetes mellitus ([place]) 09/14/2014   Thyroid nodule 08/17/2014   Obesity (BMI 30-39.9) 08/17/2014   Hyperlipidemia 08/05/2014   Nausea & vomiting 08/04/2014   Type  II diabetes mellitus with neurological manifestations, uncontrolled ([place]) 08/04/2014   Past Medical History:  Past Medical History:  Diagnosis Date   Anemia 2006   Depression 2014   previously on amitryptiline    Diabetes mellitus with neurological manifestation ([place]) 2006   Diabetic peripheral neuropathy ([place]) 04/26/2020   Fracture of left ankle 1997    Gastroparesis 07/2016   HOH (hard of hearing) 2004    Hyperlipidemia 2006   Hypertension 2006   IBS (irritable bowel syndrome) 2002    Leukopenia 2015    Macular degeneration 11/2019   Shingles 2009    Stroke [facility]) 2020   Thyroid nodule 2004   Past Surgical History:  Past Surgical History:  Procedure Laterality Date   ABDOMINAL HYSTERECTOMY  2005   CATARACT EXTRACTION Left 11/2019   CESAREAN SECTION  1983    CHOLECYSTECTOMY N/A 03/05/2020   Procedure: LAPAROSCOPIC CHOLECYSTECTOMY WITH INTRAOPERATIVE CHOLANGIOGRAM;  Surgeon: Donnie Mesa, MD;  Location: [facility];  Service: General;  Laterality: N/A;   ESOPHAGOGASTRODUODENOSCOPY (EGD) WITH PROPOFOL Left 08/26/2014   Procedure: ESOPHAGOGASTRODUODENOSCOPY (EGD) WITH PROPOFOL;  Surgeon: Arta Silence, MD;  Location: [facility] ENDOSCOPY;  Service: Endoscopy;  Laterality: Left;   ESOPHAGOGASTRODUODENOSCOPY (EGD) WITH PROPOFOL N/A 03/03/2020   Procedure: ESOPHAGOGASTRODUODENOSCOPY (EGD) WITH PROPOFOL;  Surgeon: Jerene Bears, MD;  Location: [facility] END[facility];  Service: Gastroenterology;  Laterality: N/A;   RIGHT HEART CATH N/A 07/14/2021   Procedure: RIGHT HEART CATH;  Surgeon: Larey Dresser, MD;  Location: [place] CV LAB;  Service: Cardiovascular;  Laterality: N/A;   RIGHT/LEFT HEART CATH AND CORONARY ANGIOGRAPHY N/A 04/27/2021   Procedure: RIGHT/LEFT HEART CATH AND CORONARY  ANGIOGRAPHY;  Surgeon: [name], MD;  Location: [facility] CV LAB;  Service: Cardiovascular;  Laterality: N/A;   HPI:  60 y.o. female  with past medical history of Diastolic CHF, CKD Stage IV, TIA, and Type II DM  with diabetic neuropathy, anemia of CKD, HTN, HLD admitted on 09/04/2021 with left heel ulcer.     Patient has been admitted 4 times in the past 6 months. Pt has a history of stroke in 2021 SLUMS administered, scored 24/30 indicating mild cognitive impairment   Assessment / Plan / Recommendation Clinical Impression  Pt demonstrates adequate speech and language ability though there are moderate to severe cognitive impairments present. Family at bedside report that since pts prior CVA, her cognition has been altered. At that time pt scored a 24/30 on SLUMS assessment ; today pt scored a 17/30 indicating 'dementia-like' severity of cognitive impairment, with severe deficits in working memory, problem solving and sustained attention. Pt also demonstrates a paucity of verbal interaction. She likely needs assist with all basic ADLs and would benefit from further SLP interventions to address these areas. Recommend SNF.    SLP Assessment  SLP Recommendation/Assessment: All further Speech Lanaguage Pathology  needs can be addressed in the next venue of care SLP Visit Diagnosis: Cognitive communication deficit (R41.841)    Recommendations for follow up therapy are one component of a multi-disciplinary discharge planning process, led by the attending physician.  Recommendations may be updated based on patient status, additional functional criteria and insurance authorization.    Follow Up Recommendations  Skilled Nursing facility    Frequency and Duration           SLP Evaluation Cognition  Overall Cognitive Status: Impaired/Different from baseline Arousal/Alertness: Awake/alert Orientation Level: Oriented to person;Oriented to place;Oriented to time Attention: Focused;Sustained Focused Attention: Appears intact Sustained Attention: Impaired Sustained Attention Impairment: Verbal complex Memory: Impaired Awareness: Impaired Problem Solving: Impaired Problem Solving Impairment: Verbal  complex Safety/Judgment: Impaired       Comprehension  Auditory Comprehension Overall Auditory Comprehension: Appears within functional limits for tasks assessed    Expression Verbal Expression Overall Verbal Expression: Appears within functional limits for tasks assessed   Oral / Motor  Oral Motor/Sensory Function Overall Oral Motor/Sensory Function: Within functional limits Motor Speech Overall Motor Speech: Appears within functional limits for tasks assessed   GO                    Govind Furey, Katherene Ponto 09/12/2021, 11:25 AM

## 2021-09-12 NOTE — Progress Notes (Signed)
Nancy [name] is a 60 year old female with a past medical history of HFpEF, CKD Stage V, TIA, and Type II DM who presented with left foot pain and was admitted for a gangrenous left heel ulcer secondary to diabetic neuropathy.  Subjective:  No acute events or concerns overnight. Patient denies foot pain, chest pain, shortness of breath.  Unsure if lower extremity swelling is improving.  Sister and brother of patient are at bedside.  During our discussion, it has become apparent that patient does not have supervision at home while husband is at work.  Sister and brother have their own medical needs and will be unable to care for our patient at home.  CW Levada Dy, also present during this conversation, with patient's husband on speaker phone.  The decision was made to pursue SNF placement, and patient is amendable to this solution.  Brother and sister of patient expressed concerns that her husband may not be providing accurate information, may not be able to manage her care at home.  Objective:  Vital signs in last 24 hours: Vitals:   09/11/21 0720 09/11/21 1527 09/11/21 2116 09/12/21 0800  BP: (!) 168/81 (!) 141/64 (!) 145/90 (!) 168/76  Pulse: 74 70 72 70  Resp: $Remo'17 17 17 17  'iLNGH$ Temp: 98.9 F (37.2 C) 98.3 F (36.8 C) 98.8 F (37.1 C) 98.1 F (36.7 C)  TempSrc: Oral Oral    SpO2: 93% 97% 100% 100%  Weight:      Height:       Weight change:   Intake/Output Summary (Last 24 hours) at 09/12/2021 1114 Last data filed at 09/12/2021 7017 Gross per 24 hour  Intake 120 ml  Output 1600 ml  Net -1480 ml    Physical Exam: General: Obese african [place] female, NAD HENT: normocephalic, atraumatic EYES: conjunctiva non-erythematous, no scleral icterus CV: regular rate, normal rhythm, murmur best heard at upper R and L sternal border, 2+ pitting edema to thigh Pulmonary: sating well on RA, lungs clear to auscultation Abdominal: non-distended, soft, non-tender to palpation, normal  BS Skin: Warm and dry, no rashes or lesions, Prevalon boots in place Neurological: MS: awake, alert and oriented x3, patient lacks insight into medical situation, below average fund of knowledge Motor: moves all extremities independently Psych: normal affect  Assessment/Plan:  Active Problems:   AKI (acute kidney injury) ([place])   Diabetic foot ulcer (HCC)   Cellulitis of left foot   Gangrene of left foot (HCC)   Goals of care, counseling/discussion  Nancy [name] is a 60 year old female with a past medical history of HFpEF, CKD Stage V, TIA, and Type II DM who presented with left foot pain and was admitted for a gangrenous left heel ulcer secondary to diabetic neuropathy.  #Gangrenous Diabetic Foot Ulcer, heel of left foot #Diabetic Foot Ulcer, lateral L foot #Left and Right Dorsal Foot Skin Lacerations #PAD Patient has a diabetic foot ulcers 2/2 diabetic neuropathy and is uncertain how long they have been present. She has had difficulty with ambulation due to pain. MRI w/o OM. Likely no one has inspected the feet at follow-up appointments or at home since last admission.  Patient presented to the ED on 09/19, and was given broad-spec abx for gangrenous ulcer and concern for OM. ABIs completed, and the patient has moderate bilateral LE arterial disease. Treated with IV antibiotics. Vascular and Ortho recommend transtibial amputation. Patient declined. We will pursue wound care and medical management. Transitioned from IV antibiotics to p.o. antibiotics on 9/23. Palliative met  with her and their goals of care were discussed.  Patient and husband would be agreeable with hospice at home if patient were to decline despite medical management.  Patient originally declining SNF, plan was for home health however patient will not have adequate supervision at home when husband is at work.  Patient now agreeable with SNF placement. - Vascular surgery following, appreciate recommendations -  Orthopedics consulted, appreciate recommendations -Palliative care consulted, appreciate recs - Continue aspirin 81 mg daily, Lipitor 40 mg daily. - Blood cultures NGTD - Day 9 of antibiotics: Currently on doxycycline p.o. and Augmentin p.o., continue antibiotics at discharge for full 3-week course - Oxycodone $RemoveBef'6mg'rVeLAZrrqN$  tablet q6 PRN and tylenol $RemoveBef'650mg'AXVvNFxhbK$  q6 PRN for pain - Wound care re-consulted, appreciate final recommendation - Ambulatory referral to wound care - Take Prevalon boots  and continue to use for heel offloading, PR AFO boot ordered -Pending SNF placement, CW will submit letter of assurance, Medicaid pending can take up to 90 days -Patient status switched to DNR - SLP cognitive eval still pending   #Acute on chronic HFpEF exacerbation Patient presented with lower extremity edema concerning for acute on chronic HFpEF exacerbation, which has improved with lasix. Patient had echo 06/22 with EF of 55 to 60%.  - Torsemide 20 mg twice daily - Coreg 12.5 mg BID - Strict I/O's - Daily weight   #AKI on CKD Stage V #Oliguria #Anemia of CKD Patient has progressive renal insufficiency likely secondary to diabetic nephropathy. Patient had worsening kidney function this admission with creatinine 5.2 on 9/21. UA without concerns for infection or casts. Urine sodium <10. AKI possibly due to cardiorenal type I, though BUN/CR more consistent with intrinsic kidney damage. Renal ultrasound normal. PTH elevated 113. Nephrology has concerns that patient will likely require dialysis soon, recommended patient start process for graft placement, however patient declining at this time. - Nephrology consulted, appreciate recommendations - No indication for dialysis at this time - Home torsemide for volume control - Calcium phosphate binders - Darbepoetin alfa every 4 weeks - PO iron + IV iron one dose, low concern for bacteremia - Strict I/O's - Avoid contrast to kidney or nephrotoxic agents, avoid ACE  inhibitors, ARB's, NSAIDs - Patient will need AV fistula placement at some point, declining at this time - Outpatient follow-up with nephrologist (has followed with Dr. Hollie Salk)   #Poorly Controlled Type II DM #Diabetic neuropathy Patient monitors her blood sugar once daily before meals, and her home sugars have been between 178-197. Most recent A1c is 6.8. Blood sugars appropriate this admission. - Continue lantus and humalog + SSI - Continue home duloxetine 20 mg daily   #History of tactile hallucinations - Patient was continued on Risperidone 3 mg nightly   #HTN Patient's blood pressure medications: Amlodipine 5 mg daily, carvedilol 12.5 mg twice daily, were continued.   Home medications continued this admission: Cymbalta 20 mg daily   Best Practice: Diet: Heart healthy diet IVF: None VTE: Heparin 5,000 units q8h Code: Full AB: Doxy + Augmentin both p.o.  Prior to admission living arrangement: Home Anticipated discharge location: SNF Barriers to discharge: Placement Disposition: Anticipated discharge in 1-3 days   LOS: 7 days   Wayland Denis, MD 09/12/2021, 11:14 AM Pager: [phone] After 5 PM on weekdays and 1 PM on weekends, please call pager: [phone]

## 2021-09-12 NOTE — Progress Notes (Signed)
Physical Therapy Treatment Patient Details Name: Nancy [name] MRN: [id number] DOB: 1961/11/10 Today's Date: 09/12/2021   History of Present Illness 60 y.o. female admitted on 9/19 with necrotic L heel.  MRI (+) for ulceration with possible cellulitis/myositis, (-) for OM.  Recent admission x 3 weeks ago for CHF; per pt, poor fitting hospital socks caused initial foot wound. Pt has refused MD recommendation of BK amp to LLE.  PMH includes anemia, depression, DM, peripheral neuropathy, HLD, HTN, CVA, CHF, HOH, CKD    PT Comments    Continuing work on functional mobility and activity tolerance;  Session focused on walking with PRAFO to help unweigh L heel, with success; short distance with close chair follow; pt sat unexpectedly -- high fall risk;    Noted pt now agreeable to sNF; PT is in hearty agreement  Recommendations for follow up therapy are one component of a multi-disciplinary discharge planning process, led by the attending physician.  Recommendations may be updated based on patient status, additional functional criteria and insurance authorization.  Follow Up Recommendations  SNF     Equipment Recommendations       Recommendations for Other Services       Precautions / Restrictions Precautions Precautions: Fall Precaution Comments: no WB orders but per notes "Offload" L LE Required Braces or Orthoses: Other Brace Other Brace: use walking PRAFO L with amb; B prevelon boots Restrictions Other Position/Activity Restrictions: Try to keep L heel off loaded during amb; walking PRAFO helps     Mobility  Bed Mobility Overal bed mobility: Needs Assistance Bed Mobility: Supine to Sit     Supine to sit: Min assist     General bed mobility comments: Min assist for LE managemetn coming off of the bed; Cues to scoot fully to EOB    Transfers Overall transfer level: Needs assistance Equipment used: Rolling walker (2 wheeled) Transfers: Sit to/from Stand Sit to  Stand: Mod assist;Min assist         General transfer comment: attempted sit to stand from low bed, and pt required heavy mod assist to get to stand; Noted her foot was slipping inside PRAFO, and sat down to get better fit; then stood from elevated bed with min assist  Ambulation/Gait Ambulation/Gait assistance: Min assist;+2 safety/equipment Gait Distance (Feet): 6 Feet Assistive device: Rolling walker (2 wheeled) Gait Pattern/deviations: Step-to pattern     General Gait Details: short, shuffle steps with appropriate heavy push through RW   Stairs             Wheelchair Mobility    Modified Rankin (Stroke Patients Only)       Balance     Sitting balance-Leahy Scale: Fair       Standing balance-Leahy Scale: Poor                              Cognition Arousal/Alertness: Awake/alert Behavior During Therapy: WFL for tasks assessed/performed Overall Cognitive Status: Impaired/Different from baseline Area of Impairment: Problem solving;Awareness;Safety/judgement;Following commands;Memory;Attention;Orientation                 Orientation Level: Situation;Time Current Attention Level: Selective Memory: Decreased short-term memory Following Commands: Follows one step commands inconsistently;Follows one step commands with increased time Safety/Judgement: Decreased awareness of safety;Decreased awareness of deficits     General Comments: Sits without warning      Exercises      General Comments General comments (skin integrity, edema, etc.): Needs very  close chair push for safety      Pertinent Vitals/Pain Pain Assessment: No/denies pain Faces Pain Scale: Hurts a little bit Pain Location: [place] with effort Pain Descriptors / Indicators: Discomfort Pain Intervention(s): Monitored during session    Home Living                      Prior Function            PT Goals (current goals can now be found in the care plan  section) Acute Rehab PT Goals Patient Stated Goal: Walk to window PT Goal Formulation: With patient Time For Goal Achievement: 09/20/21 Potential to Achieve Goals: Good Progress towards PT goals: Progressing toward goals    Frequency    Min 2X/week      PT Plan Discharge plan needs to be updated;Frequency needs to be updated    Co-evaluation              AM-PAC PT "6 Clicks" Mobility   Outcome Measure  Help needed turning from your back to your side while in a flat bed without using bedrails?: A Little Help needed moving from lying on your back to sitting on the side of a flat bed without using bedrails?: A Little Help needed moving to and from a bed to a chair (including a wheelchair)?: A Little Help needed standing up from a chair using your arms (e.g., wheelchair or bedside chair)?: A Lot Help needed to walk in hospital room?: A Lot Help needed climbing 3-5 steps with a railing? : A Lot 6 Click Score: 15    End of Session Equipment Utilized During Treatment: Gait belt Activity Tolerance: Patient tolerated treatment well Patient left: in chair;with call bell/phone within reach;with chair alarm set Nurse Communication: Mobility status PT Visit Diagnosis: Other abnormalities of gait and mobility (R26.89);Muscle weakness (generalized) (M62.81)     Time: 3500-9381 PT Time Calculation (min) (ACUTE ONLY): 16 min  Charges:  $Gait Training: 8-22 mins                     Roney Marion, [place] Pager [phone] Office [phone]    Colletta [place] 09/12/2021, 4:30 PM

## 2021-09-12 NOTE — NC FL2 (Signed)
[place] LEVEL OF CARE SCREENING TOOL     IDENTIFICATION  Patient Name: Nancy [name] Birthdate: May 23, 1961 Sex: female Admission Date (Current Location): 09/04/2021  [facility] and [place] Number:  Herbalist and Address:  [facility], [place] [address], [place], [place] [address]      Provider Number: [id number]  Attending Physician Name and Address:  Sid Falcon, MD  Relative Name and Phone Number:       Current Level of Care: Hospital Recommended Level of Care: [place] Prior Approval Number:    Date Approved/Denied:   PASRR Number: pending  Discharge Plan: SNF    Current Diagnoses: Patient Active Problem List   Diagnosis Date Noted   Goals of care, counseling/discussion    Gangrene of left foot ([place])    Cellulitis of left foot    Diabetic foot ulcer ([place]) 09/04/2021   CHF (congestive heart failure) ([place]) 07/07/2021   Normocytic anemia 06/30/2021   CKD (chronic kidney disease), stage IV ([place]) 63/14/9702   Acute diastolic CHF (congestive heart failure) ([place]) 06/10/2021   Abnormal nuclear stress test    Dyspnea on exertion 03/07/2021   Orthopnea 02/25/2021   History of cerebrovascular accident (CVA) with residual deficit 05/06/2020   Diabetic peripheral neuropathy ([place]) 04/26/2020   AKI (acute kidney injury) ([place]) 04/20/2020   Generalized weakness 04/20/2020   Dehydration 04/20/2020   Generalized abdominal pain    Pancreatitis, acute 02/29/2020   Cerebrovascular accident (CVA) ([place]) 11/08/2019   Stenosis of right carotid artery 11/08/2019   Hyperlipidemia associated with type 2 diabetes mellitus ([place]) 11/08/2019   Cortical age-related cataract of both eyes 08/30/2019   Gait abnormality 08/30/2019   Statin declined 08/30/2019   Gastroesophageal reflux disease without esophagitis 04/18/2018   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   Uncontrolled type 2 diabetes mellitus with diabetic neuropathy, with  long-term current use of insulin ([place]) 10/29/2017   History of macular degeneration 10/29/2017   Chronic cervical pain 10/29/2016   Myalgia 09/14/2016   Insomnia 09/14/2016   Tinea pedis of both feet 08/20/2016   Macular degeneration 08/17/2016   Low back pain 09/21/2014   Hypertension 09/21/2014   Diabetic gastroparesis associated with type 2 diabetes mellitus ([place]) 09/14/2014   Thyroid nodule 08/17/2014   Obesity (BMI 30-39.9) 08/17/2014   Hyperlipidemia 08/05/2014   Nausea & vomiting 08/04/2014   Type II diabetes mellitus with neurological manifestations, uncontrolled ([place]) 08/04/2014    Orientation RESPIRATION BLADDER Height & Weight     Self, Time, Situation, Place  Normal External catheter Weight: 105.2 kg Height:  5\' 4"  (162.6 cm)  BEHAVIORAL SYMPTOMS/MOOD NEUROLOGICAL BOWEL NUTRITION STATUS      Incontinent Diet (refer to d/c summary)  AMBULATORY STATUS COMMUNICATION OF NEEDS Skin   Extensive Assist Verbally Other (Comment) (BL foot wounds)                       Personal Care Assistance Level of Assistance  Bathing, Feeding, Dressing Bathing Assistance: Maximum assistance Feeding assistance: Independent Dressing Assistance: Maximum assistance     Functional Limitations Info  Sight, Hearing, Speech Sight Info: Adequate Hearing Info: Adequate Speech Info: Adequate    SPECIAL CARE FACTORS FREQUENCY  PT (By licensed PT), OT (By licensed OT)     PT Frequency: 5x/week , evaluate and treat OT Frequency: 5x/week , evaluate and treat            Contractures Contractures Info: Not present  Additional Factors Info  Code Status, Allergies Code Status Info: Full Code Allergies Info: Elemental Sulfur, Hydralazine Hcl, Hydrocodone, Metformin And Related, Other, Plaquenil (Hydroxychloroquine Sulfate), Shellfish-derived Products, Shrimp (Diagnostic), Sulfa Antibiotics           Current Medications (09/12/2021):  This is the current hospital active  medication list Current Facility-Administered Medications  Medication Dose Route Frequency Provider Last Rate Last Admin   acetaminophen (TYLENOL) tablet 650 mg  650 mg Oral Q6H PRN Rick Duff, MD   650 mg at 09/10/21 2204   amLODipine (NORVASC) tablet 5 mg  5 mg Oral Daily Rick Duff, MD   5 mg at 09/12/21 0911   amoxicillin-clavulanate (AUGMENTIN) 250-125 MG per tablet 1 tablet  1 tablet Oral Q12H Wayland Denis, MD   1 tablet at 09/12/21 0912   aspirin chewable tablet 81 mg  81 mg Oral Daily Rick Duff, MD   81 mg at 09/12/21 0911   atorvastatin (LIPITOR) tablet 40 mg  40 mg Oral Daily Rick Duff, MD   40 mg at 09/12/21 0911   calcium acetate (PHOSLO) capsule 667 mg  667 mg Oral TID WC Edrick Oh, MD   667 mg at 09/12/21 1215   carvedilol (COREG) tablet 12.5 mg  12.5 mg Oral BID Rick Duff, MD   12.5 mg at 09/12/21 0911   Darbepoetin Alfa (ARANESP) injection 40 mcg  40 mcg Subcutaneous Q30 days Wayland Denis, MD   40 mcg at 09/08/21 1655   doxycycline (VIBRA-TABS) tablet 100 mg  100 mg Oral Q12H Wayland Denis, MD   100 mg at 09/12/21 0911   DULoxetine (CYMBALTA) DR capsule 20 mg  20 mg Oral Daily Rick Duff, MD   20 mg at 09/12/21 0912   feeding supplement (ENSURE ENLIVE / ENSURE PLUS) liquid 237 mL  237 mL Oral BID BM Rick Duff, MD       heparin injection 5,000 Units  5,000 Units Subcutaneous Q8H Rick Duff, MD   5,000 Units at 09/12/21 0646   insulin aspart (novoLOG) injection 0-15 Units  0-15 Units Subcutaneous TID WC Rick Duff, MD   3 Units at 09/12/21 1212   iron polysaccharides (NIFEREX) capsule 150 mg  150 mg Oral Q24H Rick Duff, MD   150 mg at 09/11/21 1710   oxyCODONE (Oxy IR/ROXICODONE) immediate release tablet 5 mg  5 mg Oral Q6H PRN Rick Duff, MD   5 mg at 09/08/21 0124   risperiDONE (RISPERDAL) tablet 3 mg  3 mg Oral QHS Rick Duff, MD   3 mg at 09/11/21 2118   senna-docusate (Senokot-S) tablet  1 tablet  1 tablet Oral Daily Rick Duff, MD   1 tablet at 09/11/21 1030   torsemide (DEMADEX) tablet 20 mg  20 mg Oral BID Rick Duff, MD   20 mg at 09/12/21 4010     Discharge Medications: Please see discharge summary for a list of discharge medications.  Relevant Imaging Results:  Relevant Lab Results:   Additional Information [id number]  Sharin Mons, RN

## 2021-09-12 NOTE — TOC Progression Note (Signed)
Transition of Care [facility]) - Progression Note    Patient Details  Name: Nancy [name] MRN: [id number] Date of Birth: 1961/11/23  [facility] CM/SW Contact  Sharin Mons, RN Phone Number: 09/12/2021, 11:35 AM  Clinical Narrative:    NCM spoke with pt @ bedside along with medical team with husband on speaker phone regarding d/c plan. Pt without 24/7 supervision @ home. Husband works 3p-11pm . Husband states pt without family , friend support. NCM reinforced safety need, shared PT 's evaluation /recommendations....consider rehab placement if the request for 24/7 is not there. Pt and husband  now in agreement to SNF placement . Pt without insurance , medicaid pending. Case discussed with [facility] leadership and approval received for  SNF/LOG. NCM to provided Medicare SNF ratings list. Pt without preference for SNF placement. No further questions reported at this time. TOC team will continue to follow and assist with discharge planning needs.    Expected Discharge Plan: Skilled Nursing Facility Barriers to Discharge: No SNF bed  Expected Discharge Plan and Services Expected Discharge Plan: [place] In-house Referral: Clinical Social Work Discharge Planning Services: CM Consult Post Acute Care Choice: Home Health, Durable Medical Equipment Living arrangements for the past 2 months: Apartment Expected Discharge Date: 09/09/21               DME Arranged: N/A DME Agency: NA       HH Arranged: PT, OT, RN [place] Agency: [facility] Date [place]: 09/12/21 Time [place]: [phone] Representative spoke with at [place]: [place] ([place]) Interventions    Readmission Risk Interventions Readmission Risk Prevention Plan 03/07/2020  Transportation Screening Complete  PCP or Specialist Appt within 3-5 Days Complete  HRI or [place] (No Data)  Social Work Consult for [place] Planning/Counseling  [place] Not Applicable  Medication Review Press photographer) Complete  Some recent data might be hidden

## 2021-09-12 NOTE — H&P (Signed)
RE: Nancy [name] Date of Birth: 1961/10/20 Date: 9/27  Please be advised that the above-named patient will require a short-term nursing home stay - anticipated 30 days or less for rehabilitation and strengthening.  The plan is for return home.

## 2021-09-12 NOTE — Progress Notes (Signed)
Patient ID: Nancy [name], female   DOB: Sep 26, 1961, 60 y.o.   MRN: [id number] Reviewed surgical options with the patient she states that she is not ready to consider amputation.  Discussed risk and benefits including potential for sepsis and patient states she understands.  I have reviewed patient's conversation with palliative care.  Her mother was on both dialysis and had an amputation.  I feel patient would like to proceed with discharge to home with oral antibiotics.  I can follow-up if patient has further questions or wants to proceed with surgery.

## 2021-09-13 ENCOUNTER — Encounter (HOSPITAL_COMMUNITY)
Admission: EM | Disposition: A | Discharge status: Home Health | Payer: Self-pay | Source: Home / Self Care | Attending: Internal Medicine

## 2021-09-13 LAB — RENAL FUNCTION PANEL
Albumin: 2.4 g/dL — ABNORMAL LOW (ref 3.5–5.0)
Anion gap: 9 (ref 5–15)
BUN: 58 mg/dL — ABNORMAL HIGH (ref 8–23)
CO2: 22 mmol/L (ref 22–32)
Calcium: 8.9 mg/dL (ref 8.9–10.3)
Chloride: 106 mmol/L (ref 98–111)
Creatinine, Ser: 4.16 mg/dL — ABNORMAL HIGH (ref 0.44–1.00)
GFR, Estimated: 12 mL/min — ABNORMAL LOW (ref >60)
Glucose, Bld: 102 mg/dL — ABNORMAL HIGH (ref 70–99)
Phosphorus: 5.2 mg/dL — ABNORMAL HIGH (ref 2.5–4.6)
Potassium: 4.6 mmol/L (ref 3.5–5.1)
Sodium: 137 mmol/L (ref 135–145)

## 2021-09-13 LAB — CBC
HCT: 28.3 % — ABNORMAL LOW (ref 36.0–46.0)
Hemoglobin: 8.6 g/dL — ABNORMAL LOW (ref 12.0–15.0)
MCH: 28.1 pg (ref 26.0–34.0)
MCHC: 30.4 g/dL (ref 30.0–36.0)
MCV: 92.5 fL (ref 80.0–100.0)
Platelets: 355 10*3/uL (ref 150–400)
RBC: 3.06 MIL/uL — ABNORMAL LOW (ref 3.87–5.11)
RDW: 16.7 % — ABNORMAL HIGH (ref 11.5–15.5)
WBC: 14.9 10*3/uL — ABNORMAL HIGH (ref 4.0–10.5)
nRBC: 0 % (ref 0.0–0.2)

## 2021-09-13 LAB — GLUCOSE, CAPILLARY
Glucose-Capillary: 94 mg/dL (ref 70–99)
Glucose-Capillary: 255 mg/dL — ABNORMAL HIGH (ref 70–99)
Glucose-Capillary: 109 mg/dL — ABNORMAL HIGH (ref 70–99)
Glucose-Capillary: 172 mg/dL — ABNORMAL HIGH (ref 70–99)
Glucose-Capillary: 175 mg/dL — ABNORMAL HIGH (ref 70–99)

## 2021-09-13 SURGERY — AMPUTATION BELOW KNEE
Anesthesia: Choice | Site: Knee | Laterality: Left

## 2021-09-13 NOTE — Progress Notes (Signed)
[name] is a 60 year old female with a past medical history of HFpEF, CKD Stage V, TIA, and Type II DM who presented with left foot pain and was admitted for a gangrenous left heel ulcer secondary to diabetic neuropathy.  Subjective:  No acute events or concerns overnight. Patient denies foot pain, chest pain, shortness of breath.  Unsure if lower extremity swelling is improving.  Patient lying back in bed this morning.  She denies orthopnea, shortness of breath, foot pain.  She has been eating okay, having regular BMs.  Updated patient, we are waiting on SNF placement.  Objective:  Vital signs in last 24 hours: Vitals:   09/12/21 0800 09/12/21 1411 09/12/21 2103 09/13/21 0700  BP: (!) 168/76 (!) 159/68 (!) 189/74 (!) 161/69  Pulse: 70 70 73 78  Resp: 17 17 17 18   Temp: 98.1 F (36.7 C) 98.1 F (36.7 C) 98.1 F (36.7 C) 98.7 F (37.1 C)  TempSrc:  Oral Oral Oral  SpO2: 100% 100% 96% 100%  Weight:      Height:       Weight change:   Intake/Output Summary (Last 24 hours) at 09/13/2021 1346 Last data filed at 09/13/2021 0700 Gross per 24 hour  Intake 460 ml  Output --  Net 460 ml    Physical Exam: General: Obese african [place] female, NAD HENT: normocephalic, atraumatic EYES: conjunctiva non-erythematous, no scleral icterus CV: regular rate, normal rhythm, murmur best heard at upper R and L sternal border, 1+ pitting edema to thigh Pulmonary: sating well on RA, lungs clear to auscultation Abdominal: non-distended, soft, non-tender to palpation, normal BS Skin: Warm and dry, no rashes or lesions, Prevalon boots in place Neurological: MS: awake, alert and oriented x3, patient lacks insight into medical situation, below average fund of knowledge Motor: moves all extremities independently Psych: normal affect  Assessment/Plan:  Active Problems:   AKI (acute kidney injury) ([place])   Diabetic foot ulcer (HCC)   Cellulitis of left foot   Gangrene of left  foot (HCC)   Goals of care, counseling/discussion  [name] is a 60 year old female with a past medical history of HFpEF, CKD Stage V, TIA, and Type II DM who presented with left foot pain and was admitted for a gangrenous left heel ulcer secondary to diabetic neuropathy.  #Gangrenous Diabetic Foot Ulcer, heel of left foot #Diabetic Foot Ulcer, lateral L foot #Left and Right Dorsal Foot Skin Lacerations #PAD Vascular and Ortho recommend transtibial amputation. Patient declined. We will pursue wound care and medical management. Transitioned from IV antibiotics to p.o. antibiotics on 9/23. Patient originally declining SNF, plan was for home health however patient will not have adequate supervision at home when husband is at work.  Patient now agreeable with SNF placement. Patient is medically stable for discharge. - Vascular surgery following, appreciate recommendations - Orthopedics consulted, appreciate recommendations - Palliative care consulted, appreciate recs - Continue aspirin 81 mg daily, Lipitor 40 mg daily. - Blood cultures NGTD - Day 10 of antibiotics: Currently on doxycycline p.o. and Augmentin p.o., continue antibiotics at discharge for full 3-week course - Oxycodone 6mg  tablet q6 PRN and tylenol 650mg  q6 PRN for pain - Wound care re-consulted, appreciate final recommendation - Take Prevalon boots  and continue to use for heel offloading, PR AFO boot ordered -Pending SNF placement, CW will submit letter of assurance, Medicaid pending can take up to 90 days -Patient status switched to DNR   #Acute on chronic HFpEF exacerbation Patient presented with lower  extremity edema concerning for acute on chronic HFpEF exacerbation, which has improved with lasix. Patient had echo 06/22 with EF of 55 to 60%.  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  SLP performed cognitive evaluation during admission.  Family reports that since patient's prior CVA her cognition has been altered. During cognitive eval this admission patient scored 17 out of 30 indicating "dementia like" severity of cognitive impairment, with severe deficits in working memory problem solving sustained attention.  Patient also demonstrated paucity of verbal interaction.  SLP recommending SNF, patient requiring assistance with all basic ADLs  #AKI on CKD Stage V #Oliguria #Anemia of CKD Patient has progressive renal insufficiency secondary to diabetic nephropathy. Patient had worsening kidney function this admission. Nephrology has concerns that patient will likely require dialysis soon, recommended patient start process for graft placement, however patient declining at this time. - Nephrology signed off - No indication for dialysis at this time - Home torsemide for volume control - Calcium phosphate binders - Darbepoetin alfa every 4 weeks - PO iron + IV iron one dose, low concern for bacteremia - Strict I/O's - Avoid contrast to kidney or nephrotoxic agents, avoid ACE inhibitors, ARB's, NSAIDs - Patient will need AV fistula placement at some point, declining at this time - Outpatient follow-up with nephrologist (has followed with Dr. Hollie Salk)   #Poorly Controlled Type II DM #Diabetic neuropathy Patient monitors her blood sugar once daily before meals, and her home sugars have been between 178-197. Most recent A1c is 6.8. Blood sugars appropriate this admission. - Continue lantus and humalog + SSI - Continue home duloxetine 20 mg daily   #History of tactile hallucinations - Patient was continued on Risperidone 3 mg nightly   #HTN Patient's blood pressure medications: Amlodipine 5 mg daily, carvedilol 12.5 mg twice daily, were continued.   Home medications continued this admission: Cymbalta  20 mg daily   Best Practice: Diet: Heart healthy diet IVF: None VTE: Heparin 5,000 units q8h Code: Full AB: Doxy + Augmentin both p.o.  Prior to admission living arrangement: Home Anticipated discharge location: SNF Barriers to discharge: Placement Disposition: Anticipated discharge in 1-3 days   LOS: 8 days   Wayland Denis, MD 09/13/2021, 1:46 PM Pager: [phone] After 5 PM on weekdays and 1 PM on weekends, please call pager: [phone]

## 2021-09-13 NOTE — Plan of Care (Signed)

## 2021-09-13 NOTE — TOC Progression Note (Signed)
Transition of Care [facility]) - Initial/Assessment Note    Patient Details  Name: Nancy [name] MRN: [id number] Date of Birth: [birth year]  [facility]) CM/SW Contact:    Milinda Antis, LCSWA Phone Number: 09/13/2021, 11:52 AM  Clinical Narrative:                 11:45-  CSW submitted additional information to PASRR.  No facility has extended a bed offer.    TOC Barriers to d/c:  Pending SNF bed offers and PASRR number  Expected Discharge Plan: [place] Barriers to Discharge: No SNF bed   Patient Goals and CMS Choice Patient states their goals for this hospitalization and ongoing recovery are:: return home with husband CMS Medicare.gov Compare Post Acute Care list provided to:: Patient Choice offered to / list presented to : Patient  Expected Discharge Plan and Services Expected Discharge Plan: [place] In-house Referral: Clinical Social Work Discharge Planning Services: CM Consult Post Acute Care Choice: Home Health, Durable Medical Equipment Living arrangements for the past 2 months: Apartment Expected Discharge Date: 09/09/21               DME Arranged: N/A DME Agency: NA       HH Arranged: PT, OT, RN [place] Agency: [facility] Date [place] Agency Contacted: 09/12/21 Time [place]: [phone] Representative spoke with at [place]: Seth Bake  Prior Living Arrangements/Services Living arrangements for the past 2 months: Apartment Lives with:: Self, Spouse Patient language and need for interpreter reviewed:: Yes Do you feel safe going back to the place where you live?: Yes      Need for Family Participation in Patient Care: Yes (Comment) Care giver support system in place?: Yes (comment) Current home services: DME (cane, RW, 3/1) Criminal Activity/Legal Involvement Pertinent to Current Situation/Hospitalization: No - Comment as needed  Activities of Daily Living Home Assistive Devices/Equipment: Other (Comment)  (Cane) ADL Screening (condition at time of admission) Patient's cognitive ability adequate to safely complete daily activities?: Yes Is the patient deaf or have difficulty hearing?: Yes Does the patient have difficulty seeing, even when wearing glasses/contacts?: No Does the patient have difficulty concentrating, remembering, or making decisions?: No Patient able to express need for assistance with ADLs?: Yes Does the patient have difficulty dressing or bathing?: Yes Independently performs ADLs?: No Communication: Independent Dressing (OT): Needs assistance Is this a change from baseline?: Pre-admission baseline Grooming: Needs assistance Is this a change from baseline?: Pre-admission baseline Feeding: Independent Bathing: Needs assistance Is this a change from baseline?: Pre-admission baseline Toileting: Needs assistance Is this a change from baseline?: Pre-admission baseline In/Out Bed: Needs assistance Is this a change from baseline?: Pre-admission baseline Walks in Home: Needs assistance Is this a change from baseline?: Pre-admission baseline Does the patient have difficulty walking or climbing stairs?: Yes Weakness of Legs: Both Weakness of Arms/Hands: None  Permission Sought/Granted Permission sought to share information with : Family Supports    Share Information with NAME: Leaha Cuervo     Permission granted to share info w Relationship: spouse  Permission granted to share info w Contact Information: [phone]  Emotional Assessment Appearance:: Appears stated age Attitude/Demeanor/Rapport: Lethargic Affect (typically observed): Accepting Orientation: : Oriented to Self, Oriented to  Time, Oriented to Place, Oriented to Situation Alcohol / Substance Use: Not Applicable Psych Involvement: No (comment)  Admission diagnosis:  Diabetic foot ulcer ([place]) [V95.638, L97.509] Cellulitis of left foot [L03.116] Patient Active Problem List   Diagnosis Date Noted  Goals of care, counseling/discussion    Gangrene of left foot ([place])    Cellulitis of left foot    Diabetic foot ulcer ([place]) 09/04/2021   CHF (congestive heart failure) ([place]) 07/07/2021   Normocytic anemia 06/30/2021   CKD (chronic kidney disease), stage IV ([place]) 33/54/5625   Acute diastolic CHF (congestive heart failure) ([place]) 06/10/2021   Abnormal nuclear stress test    Dyspnea on exertion 03/07/2021   Orthopnea 02/25/2021   History of cerebrovascular accident (CVA) with residual deficit 05/06/2020   Diabetic peripheral neuropathy ([place]) 04/26/2020   AKI (acute kidney injury) ([place]) 04/20/2020   Generalized weakness 04/20/2020   Dehydration 04/20/2020   Generalized abdominal pain    Pancreatitis, acute 02/29/2020   Cerebrovascular accident (CVA) ([place]) 11/08/2019   Stenosis of right carotid artery 11/08/2019   Hyperlipidemia associated with type 2 diabetes mellitus ([place]) 11/08/2019   Cortical age-related cataract of both eyes 08/30/2019   Gait abnormality 08/30/2019   Statin declined 08/30/2019   Gastroesophageal reflux disease without esophagitis 04/18/2018   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   Uncontrolled type 2 diabetes mellitus with diabetic neuropathy, with long-term current use of insulin ([place]) 10/29/2017   History of macular degeneration 10/29/2017   Chronic cervical pain 10/29/2016   Myalgia 09/14/2016   Insomnia 09/14/2016   Tinea pedis of both feet 08/20/2016   Macular degeneration 08/17/2016   Low back pain 09/21/2014   Hypertension 09/21/2014   Diabetic gastroparesis associated with type 2 diabetes mellitus ([place]) 09/14/2014   Thyroid nodule 08/17/2014   Obesity (BMI 30-39.9) 08/17/2014   Hyperlipidemia 08/05/2014   Nausea & vomiting 08/04/2014   Type II diabetes mellitus with neurological manifestations, uncontrolled ([place]) 08/04/2014   PCP:  [name], MD Pharmacy:   [facility] [address] [place]  [address] Phone: [phone] Fax: [place] [address] [place] [address] Phone: [phone] Fax: [phone]  [id number] - [place], [place] [address] Phone: [phone] Fax: [phone]     Social Determinants of Health ([place]) Interventions    Readmission Risk Interventions Readmission Risk Prevention Plan 03/07/2020  Transportation Screening Complete  PCP or Specialist Appt within 3-5 Days Complete  HRI or [place] (No Data)  Social Work Consult for [place] Planning/Counseling [place] Not Applicable  Medication Review Press photographer) Complete  Some recent data might be hidden

## 2021-09-13 NOTE — Progress Notes (Signed)
Occupational Therapy Treatment Patient Details Name: Nancy [name] MRN: [id number] DOB: 1961/10/25 Today's Date: 09/13/2021   History of present illness 60 y.o. female admitted on 9/19 with necrotic L heel.  MRI (+) for ulceration with possible cellulitis/myositis, (-) for OM.  Recent admission x 3 weeks ago for CHF; per pt, poor fitting hospital socks caused initial foot wound. Pt has refused MD recommendation of BK amp to LLE.  PMH includes anemia, depression, DM, peripheral neuropathy, HLD, HTN, CVA, CHF, HOH, CKD   OT comments  Pt encountered lying in bed when OT arrived and agreeable to OT session. Pt transferred to [facility] commode with Min A using RW and completed toilet hygiene with Min A. Pt also washed hands and face with setup A while seated. Pt ambulated from [facility] to chair with Min guard and RW. Pt left in chair with all needs in reach. Will follow acutely.   Recommendations for follow up therapy are one component of a multi-disciplinary discharge planning process, led by the attending physician.  Recommendations may be updated based on patient status, additional functional criteria and insurance authorization.    Follow Up Recommendations  SNF;Supervision/Assistance - 24 hour    Equipment Recommendations  3 in 1 bedside commode    Recommendations for Other Services Other (comment)    Precautions / Restrictions Precautions Precautions: Fall Precaution Comments: no WB orders but per notes "Offload" L LE Required Braces or Orthoses: Other Brace Other Brace: use walking PRAFO L with amb; B prevelon boots Restrictions Weight Bearing Restrictions: No Other Position/Activity Restrictions: Try to keep L heel off loaded during amb; walking PRAFO helps       Mobility Bed Mobility Overal bed mobility: Needs Assistance Bed Mobility: Supine to Sit     Supine to sit: Supervision          Transfers                      Balance Overall balance assessment: Needs  assistance Sitting-balance support: Feet supported Sitting balance-Leahy Scale: Good     Standing balance support: Bilateral upper extremity supported;During functional activity Standing balance-Leahy Scale: Fair                             ADL either performed or assessed with clinical judgement   ADL Overall ADL's : Needs assistance/impaired     Grooming: Set up Grooming Details (indicate cue type and reason): Pt washed face and hands in sitting position with setup A                 Toilet Transfer: Minimal assistance;+2 for safety/equipment;Stand-pivot;BSC;RW   Toileting- Clothing Manipulation and Hygiene: Minimal assistance Toileting - Clothing Manipulation Details (indicate cue type and reason): Min A with hygeine for thoroughness       General ADL Comments: Pt limited by weakness and decreased activity tolerance     Vision       Perception     Praxis      Cognition Arousal/Alertness: Awake/alert Behavior During Therapy: WFL for tasks assessed/performed Overall Cognitive Status: Within Functional Limits for tasks assessed                        Exercises     Shoulder Instructions       General Comments      Pertinent Vitals/ Pain       Pain Assessment: No/denies pain Pain  Score: 0-No pain  Home Living                                          Prior Functioning/Environment              Frequency  Min 2X/week        Progress Toward Goals  OT Goals(current goals can now be found in the care plan section)  Progress towards OT goals: Progressing toward goals  Acute Rehab OT Goals Patient Stated Goal: Walk to window OT Goal Formulation: With patient Time For Goal Achievement: 09/21/21 Potential to Achieve Goals: Good  Plan Discharge plan remains appropriate;Frequency remains appropriate    Co-evaluation                 AM-PAC OT "6 Clicks" Daily Activity     Outcome Measure   Help  from another person eating meals?: A Little Help from another person taking care of personal grooming?: A Little Help from another person toileting, which includes using toliet, bedpan, or urinal?: A Lot Help from another person bathing (including washing, rinsing, drying)?: A Lot Help from another person to put on and taking off regular upper body clothing?: A Lot Help from another person to put on and taking off regular lower body clothing?: A Lot 6 Click Score: 14    End of Session Equipment Utilized During Treatment: Gait belt;Rolling walker;Other (comment) (BSC, PRAFO)  OT Visit Diagnosis: Other abnormalities of gait and mobility (R26.89);Muscle weakness (generalized) (M62.81);Pain;Other symptoms and signs involving cognitive function   Activity Tolerance Patient limited by fatigue   Patient Left in chair;with call bell/phone within reach;with chair alarm set   Nurse Communication Mobility status        Time: 0110-0137 OT Time Calculation (min): 27 min  Charges: OT General Charges $OT Visit: 1 Visit OT Treatments $Self Care/Home Management : 23-37 mins  Cian Costanzo C acute rehab [place] 09/13/2021, 2:15 PM

## 2021-09-13 NOTE — NC FL2 (Addendum)
[place] LEVEL OF CARE SCREENING TOOL     IDENTIFICATION  Patient Name: Nancy [name] Birthdate: 02-10-1961 Sex: female Admission Date (Current Location): 09/04/2021  [facility] [place] Number:  Herbalist and Address:  [facility] [employer], [place] [address], [place],  [address]      Provider Number: [id number]  Attending Physician Name and Address:  Sid Falcon, MD  Relative Name and Phone Number:       Current Level of Care: Hospital Recommended Level of Care: [place] Prior Approval Number:    Date Approved/Denied:   PASRR Number: pending  Discharge Plan: SNF    Current Diagnoses: Patient Active Problem List   Diagnosis Date Noted   Goals of care, counseling/discussion    Gangrene of left foot ([place])    Cellulitis of left foot    Diabetic foot ulcer ([place]) 09/04/2021   CHF (congestive heart failure) ([place]) 07/07/2021   Normocytic anemia 06/30/2021   CKD (chronic kidney disease), stage IV ([place]) 43/15/4008   Acute diastolic CHF (congestive heart failure) ([place]) 06/10/2021   Abnormal nuclear stress test    Dyspnea on exertion 03/07/2021   Orthopnea 02/25/2021   History of cerebrovascular accident (CVA) with residual deficit 05/06/2020   Diabetic peripheral neuropathy ([place]) 04/26/2020   AKI (acute kidney injury) ([place]) 04/20/2020   Generalized weakness 04/20/2020   Dehydration 04/20/2020   Generalized abdominal pain    Pancreatitis, acute 02/29/2020   Cerebrovascular accident (CVA) ([place]) 11/08/2019   Stenosis of right carotid artery 11/08/2019   Hyperlipidemia associated with type 2 diabetes mellitus ([place]) 11/08/2019   Cortical age-related cataract of both eyes 08/30/2019   Gait abnormality 08/30/2019   Statin declined 08/30/2019   Gastroesophageal reflux disease without esophagitis 04/18/2018   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   Uncontrolled type 2 diabetes mellitus with diabetic neuropathy, with  long-term current use of insulin ([place]) 10/29/2017   History of macular degeneration 10/29/2017   Chronic cervical pain 10/29/2016   Myalgia 09/14/2016   Insomnia 09/14/2016   Tinea pedis of both feet 08/20/2016   Macular degeneration 08/17/2016   Low back pain 09/21/2014   Hypertension 09/21/2014   Diabetic gastroparesis associated with type 2 diabetes mellitus ([place]) 09/14/2014   Thyroid nodule 08/17/2014   Obesity (BMI 30-39.9) 08/17/2014   Hyperlipidemia 08/05/2014   Nausea & vomiting 08/04/2014   Type II diabetes mellitus with neurological manifestations, uncontrolled ([place]) 08/04/2014    Orientation RESPIRATION BLADDER Height & Weight     Self, Time, Situation, Place  Normal External catheter Weight: 232 lb (105.2 kg) Height:  5\' 4"  (162.6 cm)  BEHAVIORAL SYMPTOMS/MOOD NEUROLOGICAL BOWEL NUTRITION STATUS      Incontinent Diet (refer to d/c summary)  AMBULATORY STATUS COMMUNICATION OF NEEDS Skin   Extensive Assist Verbally Other (Comment) (BL foot wounds)                       Personal Care Assistance Level of Assistance  Bathing, Feeding, Dressing Bathing Assistance: Maximum assistance Feeding assistance: Independent Dressing Assistance: Maximum assistance     Functional Limitations Info  Sight, Hearing, Speech Sight Info: Adequate Hearing Info: Adequate Speech Info: Adequate    SPECIAL CARE FACTORS FREQUENCY  PT (By licensed PT), OT (By licensed OT)     PT Frequency: 5x/week , evaluate and treat OT Frequency: 5x/week , evaluate and treat            Contractures Contractures Info: Not present  Additional Factors Info  Code Status, Allergies, Psychotropic Code Status Info: Full Code Allergies Info: Elemental Sulfur, Hydralazine Hcl, Hydrocodone, Metformin And Related, Other, Plaquenil (Hydroxychloroquine Sulfate), Shellfish-derived Products, Shrimp (Diagnostic), Sulfa Antibiotics Psychotropic Info: depression         Current Medications  (09/13/2021):  This is the current hospital active medication list Current Facility-Administered Medications  Medication Dose Route Frequency Provider Last Rate Last Admin   acetaminophen (TYLENOL) tablet 650 mg  650 mg Oral Q6H PRN Rick Duff, MD   650 mg at 09/13/21 0550   amLODipine (NORVASC) tablet 5 mg  5 mg Oral Daily Rick Duff, MD   5 mg at 09/13/21 0831   amoxicillin-clavulanate (AUGMENTIN) 250-125 MG per tablet 1 tablet  1 tablet Oral Q12H Wayland Denis, MD   1 tablet at 09/13/21 4540   aspirin chewable tablet 81 mg  81 mg Oral Daily Rick Duff, MD   81 mg at 09/13/21 0830   atorvastatin (LIPITOR) tablet 40 mg  40 mg Oral Daily Rick Duff, MD   40 mg at 09/13/21 0831   calcium acetate (PHOSLO) capsule 667 mg  667 mg Oral TID WC Edrick Oh, MD   667 mg at 09/13/21 1210   carvedilol (COREG) tablet 12.5 mg  12.5 mg Oral BID Rick Duff, MD   12.5 mg at 09/13/21 0831   Darbepoetin Alfa (ARANESP) injection 40 mcg  40 mcg Subcutaneous Q30 days Wayland Denis, MD   40 mcg at 09/08/21 1655   doxycycline (VIBRA-TABS) tablet 100 mg  100 mg Oral Q12H Wayland Denis, MD   100 mg at 09/13/21 0830   DULoxetine (CYMBALTA) DR capsule 20 mg  20 mg Oral Daily Rick Duff, MD   20 mg at 09/13/21 9811   feeding supplement (ENSURE ENLIVE / ENSURE PLUS) liquid 237 mL  237 mL Oral BID BM Rick Duff, MD   237 mL at 09/13/21 0933   heparin injection 5,000 Units  5,000 Units Subcutaneous Q8H Rick Duff, MD   5,000 Units at 09/13/21 1459   insulin aspart (novoLOG) injection 0-15 Units  0-15 Units Subcutaneous TID WC Rick Duff, MD   8 Units at 09/13/21 9147   iron polysaccharides (NIFEREX) capsule 150 mg  150 mg Oral Q24H Rick Duff, MD   150 mg at 09/13/21 1501   oxyCODONE (Oxy IR/ROXICODONE) immediate release tablet 5 mg  5 mg Oral Q6H PRN Rick Duff, MD   5 mg at 09/08/21 0124   risperiDONE (RISPERDAL) tablet 3 mg  3 mg Oral QHS Rick Duff, MD   3 mg at 09/12/21 2300   senna-docusate (Senokot-S) tablet 1 tablet  1 tablet Oral Daily Rick Duff, MD   1 tablet at 09/11/21 1030   torsemide (DEMADEX) tablet 20 mg  20 mg Oral BID Rick Duff, MD   20 mg at 09/13/21 0932     Discharge Medications: Please see discharge summary for a list of discharge medications.  Relevant Imaging Results:  Relevant Lab Results:   Additional Information [id number]  Paulene Floor Tenzin Edelman, LCSWA

## 2021-09-14 LAB — GLUCOSE, CAPILLARY
Glucose-Capillary: 144 mg/dL — ABNORMAL HIGH (ref 70–99)
Glucose-Capillary: 179 mg/dL — ABNORMAL HIGH (ref 70–99)
Glucose-Capillary: 126 mg/dL — ABNORMAL HIGH (ref 70–99)
Glucose-Capillary: 157 mg/dL — ABNORMAL HIGH (ref 70–99)

## 2021-09-14 MED ORDER — LABETALOL HCL 5 MG/ML IV SOLN
5.0000 mg | INTRAVENOUS | Status: DC | PRN
  Administered 2021-09-19: 5 mg via INTRAVENOUS
  Filled 2021-09-14 (×2): qty 4

## 2021-09-14 MED ORDER — ONDANSETRON HCL 4 MG PO TABS
4.0000 mg | ORAL_TABLET | Freq: Three times a day (TID) | ORAL | Status: DC | PRN
  Administered 2021-09-14 – 2021-09-20 (×3): 4 mg via ORAL
  Filled 2021-09-14 (×3): qty 1

## 2021-09-14 NOTE — Progress Notes (Signed)
Nancy [name] is a 60 year old female with a past medical history of HFpEF, CKD Stage V, TIA, and Type II DM who presented with left foot pain and was admitted for a gangrenous left heel ulcer secondary to diabetic neuropathy.  Subjective:  No acute events or concerns overnight. Patient denies foot pain, chest pain, shortness of breath.  Unsure if lower extremity swelling is improving.  We will continue to work with Theatre stage manager to find bed placement.  Objective:  Vital signs in last 24 hours: Vitals:   09/13/21 0700 09/13/21 1500 09/13/21 2003 09/14/21 0753  BP: (!) 161/69 (!) 139/92 (!) 144/73 (!) 157/59  Pulse: 78 72 71 70  Resp: 18 19 15 17   Temp: 98.7 F (37.1 C) 98.6 F (37 C) 98 F (36.7 C) 98.6 F (37 C)  TempSrc: Oral Oral Oral Oral  SpO2: 100% 99% 99% 100%  Weight:      Height:       Weight change:   Intake/Output Summary (Last 24 hours) at 09/14/2021 1135 Last data filed at 09/14/2021 0258 Gross per 24 hour  Intake 220 ml  Output 1000 ml  Net -780 ml    Physical Exam: General: Obese african [place] female, NAD HENT: normocephalic, atraumatic EYES: conjunctiva non-erythematous, no scleral icterus CV: regular rate, normal rhythm, murmur best heard at upper R and L sternal border, 2+ pitting bilateral lower extremity edema to thigh Pulmonary: sating well on RA, lungs clear to auscultation Abdominal: non-distended, soft, non-tender to palpation, normal BS Skin: Warm and dry, no rashes or lesions, Prevalon boots in place Neurological: MS: awake, alert and oriented x3, patient lacks insight into medical situation, below average fund of knowledge Motor: moves all extremities independently Psych: normal affect  Assessment/Plan:  Active Problems:   AKI (acute kidney injury) ([place])   Diabetic foot ulcer (HCC)   Cellulitis of left foot   Gangrene of left foot (HCC)   Goals of care, counseling/discussion  [name] is a  60 year old female with a past medical history of HFpEF, CKD Stage V, TIA, and Type II DM who presented with left foot pain and was admitted for a gangrenous left heel ulcer secondary to diabetic neuropathy.  #Gangrenous Diabetic Foot Ulcer, heel of left foot #Diabetic Foot Ulcer, lateral L foot #Left and Right Dorsal Foot Skin Lacerations #PAD Vascular and Ortho recommend transtibial amputation. Patient declined. We will pursue wound care and medical management. Transitioned from IV antibiotics to p.o. antibiotics on 9/23. Patient originally declining SNF, plan was for home health however patient will not have adequate supervision at home when husband is at work.  Patient now agreeable with SNF placement. Patient is medically stable for discharge. - Vascular surgery following, appreciate recommendations - Orthopedics consulted, appreciate recommendations - Palliative care consulted, appreciate recs - Continue aspirin 81 mg daily, Lipitor 40 mg daily. - Blood cultures NGTD - Day 10 of antibiotics: Currently on doxycycline p.o. and Augmentin p.o., continue antibiotics at discharge for full 3-week course - Oxycodone 6mg  tablet q6 PRN and tylenol 650mg  q6 PRN for pain - Wound care re-consulted, appreciate final recommendation - Take Prevalon boots  and continue to use for heel offloading, PR AFO boot ordered -Pending charity SNF placement, Medicaid pending can take up to 90 days -Patient status switched to DNR   #Acute on chronic HFpEF exacerbation Patient presented with lower extremity edema concerning for acute on chronic HFpEF exacerbation, which has improved with lasix. Patient had echo 06/22 with EF of 55  to 60%.  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  SLP performed cognitive evaluation during  admission.  Family reports that since patient's prior CVA her cognition has been altered. During cognitive eval this admission patient scored 17 out of 30 indicating "dementia like" severity of cognitive impairment, with severe deficits in working memory problem solving sustained attention.  Patient also demonstrated paucity of verbal interaction.  SLP recommending SNF, patient requiring assistance with all basic ADLs  #AKI on CKD Stage V #Oliguria #Anemia of CKD Patient has progressive renal insufficiency secondary to diabetic nephropathy. Patient had worsening kidney function this admission. Nephrology has concerns that patient will likely require dialysis soon, recommended patient start process for graft placement, however patient declining at this time. - Nephrology signed off - No indication for dialysis at this time - Home torsemide for volume control - Calcium phosphate binders - Darbepoetin alfa every 4 weeks - PO iron + IV iron one dose, low concern for bacteremia - Strict I/O's - Avoid contrast to kidney or nephrotoxic agents, avoid ACE inhibitors, ARB's, NSAIDs - Patient will need AV fistula placement at some point, declining at this time - Outpatient follow-up with nephrologist (has followed with Dr. Hollie Salk)   #Poorly Controlled Type II DM #Diabetic neuropathy Patient monitors her blood sugar once daily before meals, and her home sugars have been between 178-197. Most recent A1c is 6.8. Blood sugars appropriate this admission. - Continue lantus and humalog + SSI - Continue home duloxetine 20 mg daily   #History of tactile hallucinations - Patient was continued on Risperidone 3 mg nightly   #HTN Patient's blood pressure medications: Amlodipine 5 mg daily, carvedilol 12.5 mg twice daily, were continued.   Home medications continued this admission: Cymbalta 20 mg daily   Best Practice: Diet: Heart healthy diet IVF: None VTE: Heparin 5,000 units q8h Code: Full AB: Doxy +  Augmentin both p.o.  Prior to admission living arrangement: Home Anticipated discharge location: SNF Barriers to discharge: Placement Disposition: Anticipated discharge in 1-3 days   LOS: 9 days   Wayland Denis, MD 09/14/2021, 11:35 AM Pager: [phone] After 5 PM on weekdays and 1 PM on weekends, please call pager: [phone]

## 2021-09-14 NOTE — TOC Progression Note (Addendum)
Transition of Care [facility]) - Initial/Assessment Note    Patient Details  Name: Nancy [name] MRN: [id number] Date of Birth: Nov 17, 1961  Transition of [facility]) CM/SW Contact:    Milinda Antis, LCSWA Phone Number: 09/14/2021, 10:05 AM  Clinical Narrative:              PASRR received.  [phone]  10:15-  CSW contacted Tressa Busman with amissions at [place] ([facility]) to inquire about the facilities ability to accept  the patient with a LOG.  The facility is reviewing the patient and will inform CSW when a decision is made.  14:45-  CSW informed by Tressa Busman at [facility] that the facility will not be able to accept the LOG.  The patient would have to be approved for Medicaid first and then they could accept.  CSW spoke with Shirlee Limerick at [facility], [place] with [facility], Threasa Beards with [facility], and [place] with [facility].  None are able to extend a bed offer.  Pending: bed offers  Expected Discharge Plan: [place] Barriers to Discharge: No SNF bed   Patient Goals and CMS Choice Patient states their goals for this hospitalization and ongoing recovery are:: return home with husband CMS Medicare.gov Compare Post Acute Care list provided to:: Patient Choice offered to / list presented to : Patient  Expected Discharge Plan and Services Expected Discharge Plan: Echo In-house Referral: Clinical Social Work Discharge Planning Services: CM Consult Post Acute Care Choice: Home Health, Durable Medical Equipment Living arrangements for the past 2 months: Apartment Expected Discharge Date: 09/09/21               DME Arranged: N/A DME Agency: NA       HH Arranged: PT, OT, RN [place] Agency: [facility] Date [place] Agency Contacted: 09/12/21 Time [place]: [phone] Representative spoke with at [place]: Seth Bake  Prior Living Arrangements/Services Living arrangements for the past 2 months: Apartment Lives with:: Self,  Spouse Patient language and need for interpreter reviewed:: Yes Do you feel safe going back to the place where you live?: Yes      Need for Family Participation in Patient Care: Yes (Comment) Care giver support system in place?: Yes (comment) Current home services: DME (cane, RW, 3/1) Criminal Activity/Legal Involvement Pertinent to Current Situation/Hospitalization: No - Comment as needed  Activities of Daily Living Home Assistive Devices/Equipment: Other (Comment) (Cane) ADL Screening (condition at time of admission) Patient's cognitive ability adequate to safely complete daily activities?: Yes Is the patient deaf or have difficulty hearing?: Yes Does the patient have difficulty seeing, even when wearing glasses/contacts?: No Does the patient have difficulty concentrating, remembering, or making decisions?: No Patient able to express need for assistance with ADLs?: Yes Does the patient have difficulty dressing or bathing?: Yes Independently performs ADLs?: No Communication: Independent Dressing (OT): Needs assistance Is this a change from baseline?: Pre-admission baseline Grooming: Needs assistance Is this a change from baseline?: Pre-admission baseline Feeding: Independent Bathing: Needs assistance Is this a change from baseline?: Pre-admission baseline Toileting: Needs assistance Is this a change from baseline?: Pre-admission baseline In/Out Bed: Needs assistance Is this a change from baseline?: Pre-admission baseline Walks in Home: Needs assistance Is this a change from baseline?: Pre-admission baseline Does the patient have difficulty walking or climbing stairs?: Yes Weakness of Legs: Both Weakness of Arms/Hands: None  Permission Sought/Granted Permission sought to share information with : Family Supports    Share Information with NAME: Avila Albritton  Permission granted to share info w Relationship: spouse  Permission granted to share info w Contact Information:  [phone]  Emotional Assessment Appearance:: Appears stated age Attitude/Demeanor/Rapport: Lethargic Affect (typically observed): Accepting Orientation: : Oriented to Self, Oriented to  Time, Oriented to Place, Oriented to Situation Alcohol / Substance Use: Not Applicable Psych Involvement: No (comment)  Admission diagnosis:  Diabetic foot ulcer ([place]) [U88.916, L97.509] Cellulitis of left foot [L03.116] Patient Active Problem List   Diagnosis Date Noted   Goals of care, counseling/discussion    Gangrene of left foot ([place])    Cellulitis of left foot    Diabetic foot ulcer ([place]) 09/04/2021   CHF (congestive heart failure) ([place]) 07/07/2021   Normocytic anemia 06/30/2021   CKD (chronic kidney disease), stage IV ([place]) 94/50/3888   Acute diastolic CHF (congestive heart failure) ([place]) 06/10/2021   Abnormal nuclear stress test    Dyspnea on exertion 03/07/2021   Orthopnea 02/25/2021   History of cerebrovascular accident (CVA) with residual deficit 05/06/2020   Diabetic peripheral neuropathy ([place]) 04/26/2020   AKI (acute kidney injury) ([place]) 04/20/2020   Generalized weakness 04/20/2020   Dehydration 04/20/2020   Generalized abdominal pain    Pancreatitis, acute 02/29/2020   Cerebrovascular accident (CVA) ([place]) 11/08/2019   Stenosis of right carotid artery 11/08/2019   Hyperlipidemia associated with type 2 diabetes mellitus ([place]) 11/08/2019   Cortical age-related cataract of both eyes 08/30/2019   Gait abnormality 08/30/2019   Statin declined 08/30/2019   Gastroesophageal reflux disease without esophagitis 04/18/2018   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   Uncontrolled type 2 diabetes mellitus with diabetic neuropathy, with long-term current use of insulin ([place]) 10/29/2017   History of macular degeneration 10/29/2017   Chronic cervical pain 10/29/2016   Myalgia 09/14/2016   Insomnia 09/14/2016   Tinea pedis of both feet 08/20/2016   Macular degeneration 08/17/2016   Low back pain  09/21/2014   Hypertension 09/21/2014   Diabetic gastroparesis associated with type 2 diabetes mellitus ([place]) 09/14/2014   Thyroid nodule 08/17/2014   Obesity (BMI 30-39.9) 08/17/2014   Hyperlipidemia 08/05/2014   Nausea & vomiting 08/04/2014   Type II diabetes mellitus with neurological manifestations, uncontrolled ([place]) 08/04/2014   PCP:  [name], MD Pharmacy:   [facility] [address] [place] [address] Phone: [phone] Fax: [place] [address] [place] [address] Phone: [phone] Fax: [phone]  [id number] [place], [place] [address] Phone: [phone] Fax: [phone]     Social Determinants of Health ([place]) Interventions    Readmission Risk Interventions Readmission Risk Prevention Plan 03/07/2020  Transportation Screening Complete  PCP or Specialist Appt within 3-5 Days Complete  HRI or [place] (No Data)  Social Work Consult for [place] Planning/Counseling [place] Not Applicable  Medication Review Press photographer) Complete  Some recent data might be hidden

## 2021-09-15 LAB — RENAL FUNCTION PANEL
Albumin: 2.3 g/dL — ABNORMAL LOW (ref 3.5–5.0)
Anion gap: 7 (ref 5–15)
BUN: 46 mg/dL — ABNORMAL HIGH (ref 8–23)
CO2: 26 mmol/L (ref 22–32)
Calcium: 9 mg/dL (ref 8.9–10.3)
Chloride: 104 mmol/L (ref 98–111)
Creatinine, Ser: 3.22 mg/dL — ABNORMAL HIGH (ref 0.44–1.00)
GFR, Estimated: 16 mL/min — ABNORMAL LOW (ref >60)
Glucose, Bld: 123 mg/dL — ABNORMAL HIGH (ref 70–99)
Phosphorus: 4.5 mg/dL (ref 2.5–4.6)
Potassium: 4.3 mmol/L (ref 3.5–5.1)
Sodium: 137 mmol/L (ref 135–145)

## 2021-09-15 LAB — GLUCOSE, CAPILLARY
Glucose-Capillary: 102 mg/dL — ABNORMAL HIGH (ref 70–99)
Glucose-Capillary: 134 mg/dL — ABNORMAL HIGH (ref 70–99)
Glucose-Capillary: 175 mg/dL — ABNORMAL HIGH (ref 70–99)
Glucose-Capillary: 220 mg/dL — ABNORMAL HIGH (ref 70–99)

## 2021-09-15 LAB — CBC
HCT: 30 % — ABNORMAL LOW (ref 36.0–46.0)
Hemoglobin: 9.3 g/dL — ABNORMAL LOW (ref 12.0–15.0)
MCH: 28.3 pg (ref 26.0–34.0)
MCHC: 31 g/dL (ref 30.0–36.0)
MCV: 91.2 fL (ref 80.0–100.0)
Platelets: 318 10*3/uL (ref 150–400)
RBC: 3.29 MIL/uL — ABNORMAL LOW (ref 3.87–5.11)
RDW: 16.3 % — ABNORMAL HIGH (ref 11.5–15.5)
WBC: 11.4 10*3/uL — ABNORMAL HIGH (ref 4.0–10.5)
nRBC: 0 % (ref 0.0–0.2)

## 2021-09-15 MED ORDER — SODIUM CHLORIDE 0.9 % IV SOLN
6.2500 mg | Freq: Once | INTRAVENOUS | Status: DC
  Filled 2021-09-15: qty 0.25

## 2021-09-15 NOTE — Progress Notes (Signed)
Nancy [name] is a 60 year old female with a past medical history of HFpEF, CKD Stage V, TIA, and Type II DM who presented with left foot pain and was admitted for a gangrenous left heel ulcer secondary to diabetic neuropathy.  Subjective:  No acute events or concerns overnight. Patient denies foot pain, chest pain, shortness of breath.  Unsure if lower extremity swelling is improving.  We will continue to work with Theatre stage manager to find bed placement.  Objective:  Vital signs in last 24 hours: Vitals:   09/14/21 1338 09/14/21 1549 09/14/21 1935 09/15/21 0807  BP:  (!) 177/74 (!) 172/71 (!) 161/75  Pulse:  76 71 75  Resp:   18 17  Temp:  98.4 F (36.9 C) 98.9 F (37.2 C) 98.7 F (37.1 C)  TempSrc:  Oral Oral Oral  SpO2:  98% 95% 99%  Weight: 105.2 kg     Height:       Weight change:   Intake/Output Summary (Last 24 hours) at 09/15/2021 0850 Last data filed at 09/14/2021 1610 Gross per 24 hour  Intake 240 ml  Output 1200 ml  Net -960 ml    Physical Exam: General: Obese african [place] female, NAD HENT: normocephalic, atraumatic EYES: conjunctiva non-erythematous, no scleral icterus CV: regular rate, normal rhythm, murmur best heard at upper R and L sternal border, 2+ pitting bilateral lower extremity edema to thigh Pulmonary: sating well on RA, lungs clear to auscultation Abdominal: non-distended, soft, non-tender to palpation, normal BS Skin: Warm and dry, no rashes or lesions, Prevalon boots in place Neurological: MS: awake, alert and oriented x3, patient lacks insight into medical situation, below average fund of knowledge Motor: moves all extremities independently Psych: normal affect  Assessment/Plan:  Active Problems:   AKI (acute kidney injury) ([place])   Diabetic foot ulcer (HCC)   Cellulitis of left foot   Gangrene of left foot (HCC)   Goals of care, counseling/discussion  Nancy [name] is a 60 year old female with a past  medical history of HFpEF, CKD Stage V, TIA, and Type II DM who presented with left foot pain and was admitted for a gangrenous left heel ulcer secondary to diabetic neuropathy.  #Gangrenous Diabetic Foot Ulcer, heel of left foot #Diabetic Foot Ulcer, lateral L foot #Left and Right Dorsal Foot Skin Lacerations #PAD Vascular and Ortho recommend transtibial amputation. Patient declined. We will pursue wound care and medical management. Transitioned from IV antibiotics to p.o. antibiotics on 9/23. Patient originally declining SNF, plan was for home health however patient will not have adequate supervision at home when husband is at work.  Patient now agreeable with SNF placement. Patient is medically stable for discharge. - Vascular, orthopedics, palliative, wound care signed off - Continue aspirin 81 mg daily, Lipitor 40 mg daily. - Blood cultures NGTD - Day 10 of antibiotics: Currently on doxycycline p.o. and Augmentin p.o., continue antibiotics at discharge for full 3-week course, end date: 10/10 - Oxycodone 6mg  tablet q6 PRN and tylenol 650mg  q6 PRN for pain - Take Prevalon boots  and continue to use for heel offloading, PR AFO boot ordered -Pending charity SNF placement, Medicaid pending can take up to 90 days    #Acute on chronic HFpEF exacerbation Patient presented with lower extremity edema concerning for acute on chronic HFpEF exacerbation, which has improved with lasix. Patient had echo 06/22 with EF of 55 to 60%.  - Torsemide 20 mg twice daily - Coreg 12.5 mg BID - Strict I/O's - Daily weight    #  CVA, noted in 2020, reported residual left-sided weakness #Cognitive impairment #Hx of cryptococcus meningitis Patient was continued on aspirin 81 mg daily and Lipitor 40 mg daily.  SLP performed cognitive evaluation during admission.  Family reports that since patient's prior CVA her cognition has been altered. During cognitive eval this admission patient scored 17 out of 30 indicating  "dementia like" severity of cognitive impairment, with severe deficits in working memory problem solving sustained attention.  Patient also demonstrated paucity of verbal interaction.  SLP recommending SNF, patient requiring assistance with all basic ADLs  #AKI on CKD Stage V #Oliguria #Anemia of CKD Patient has progressive renal insufficiency secondary to diabetic nephropathy. Patient had worsening kidney function this admission. Nephrology has concerns that patient will likely require dialysis soon, recommended patient start process for graft placement, however patient declining at this time.  Kidney function improving. - Nephrology signed off - No indication for dialysis at this time - Home torsemide for volume control - Calcium phosphate binders - Darbepoetin alfa every 4 weeks - PO iron + IV iron one dose, low concern for bacteremia - Strict I/O's - Avoid contrast to kidney or nephrotoxic agents, avoid ACE inhibitors, ARB's, NSAIDs - Patient will need AV fistula placement at some point, declining at this time - Outpatient follow-up with nephrologist (has followed with Dr. Hollie Salk)   #Poorly Controlled Type II DM #Diabetic neuropathy Patient monitors her blood sugar once daily before meals, and her home sugars have been between 178-197. Most recent A1c is 6.8. Blood sugars appropriate this admission. - Continue lantus and humalog + SSI - Continue home duloxetine 20 mg daily   #History of tactile hallucinations - Patient was continued on Risperidone 3 mg nightly   #HTN Patient's blood pressure medications: Amlodipine 5 mg daily, carvedilol 12.5 mg twice daily, were continued.   Home medications continued this admission: Cymbalta 20 mg daily   Best Practice: Diet: Heart healthy diet IVF: None VTE: Heparin 5,000 units q8h Code: Full AB: Doxy + Augmentin both p.o.  Prior to admission living arrangement: Home Anticipated discharge location: SNF Barriers to discharge:  Placement Disposition: Anticipated discharge in 1-3 days   LOS: 10 days   Wayland Denis, MD 09/15/2021, 8:50 AM Pager: [phone] After 5 PM on weekdays and 1 PM on weekends, please call pager: [phone]

## 2021-09-15 NOTE — TOC Progression Note (Signed)
Transition of Care [facility]) - Initial/Assessment Note    Patient Details  Name: Nancy [name] MRN: [id number] Date of Birth: 1961-07-24  Transition of Care [facility]) CM/SW Contact:    Milinda Antis, LCSWA Phone Number: 09/15/2021, 1:26 PM  Clinical Narrative:                 CSW contacted [place] and [place],  [place] (SNF) facilities, and [facility] in [place] [phone]) to inquire about their ability to accept an LOG.  CSW left VM with all of the agencies above.     Expected Discharge Plan: Skilled Nursing Facility Barriers to Discharge: No SNF bed   Patient Goals and CMS Choice Patient states their goals for this hospitalization and ongoing recovery are:: return home with husband CMS Medicare.gov Compare Post Acute Care list provided to:: Patient Choice offered to / list presented to : Patient  Expected Discharge Plan and Services Expected Discharge Plan: [place] In-house Referral: Clinical Social Work Discharge Planning Services: CM Consult Post Acute Care Choice: Home Health, Durable Medical Equipment Living arrangements for the past 2 months: Apartment Expected Discharge Date: 09/09/21               DME Arranged: N/A DME Agency: NA       HH Arranged: PT, OT, RN [place] Agency: [facility] Date [place] Agency Contacted: 09/12/21 Time [place]: [phone] Representative spoke with at [place]: Seth Bake  Prior Living Arrangements/Services Living arrangements for the past 2 months: Apartment Lives with:: Self, Spouse Patient language and need for interpreter reviewed:: Yes Do you feel safe going back to the place where you live?: Yes      Need for Family Participation in Patient Care: Yes (Comment) Care giver support system in place?: Yes (comment) Current home services: DME (cane, RW, 3/1) Criminal Activity/Legal Involvement Pertinent to Current Situation/Hospitalization: No - Comment as needed  Activities of Daily  Living Home Assistive Devices/Equipment: Other (Comment) (Cane) ADL Screening (condition at time of admission) Patient's cognitive ability adequate to safely complete daily activities?: Yes Is the patient deaf or have difficulty hearing?: Yes Does the patient have difficulty seeing, even when wearing glasses/contacts?: No Does the patient have difficulty concentrating, remembering, or making decisions?: No Patient able to express need for assistance with ADLs?: Yes Does the patient have difficulty dressing or bathing?: Yes Independently performs ADLs?: No Communication: Independent Dressing (OT): Needs assistance Is this a change from baseline?: Pre-admission baseline Grooming: Needs assistance Is this a change from baseline?: Pre-admission baseline Feeding: Independent Bathing: Needs assistance Is this a change from baseline?: Pre-admission baseline Toileting: Needs assistance Is this a change from baseline?: Pre-admission baseline In/Out Bed: Needs assistance Is this a change from baseline?: Pre-admission baseline Walks in Home: Needs assistance Is this a change from baseline?: Pre-admission baseline Does the patient have difficulty walking or climbing stairs?: Yes Weakness of Legs: Both Weakness of Arms/Hands: None  Permission Sought/Granted Permission sought to share information with : Family Supports    Share Information with NAME: Danyah Guastella     Permission granted to share info w Relationship: spouse  Permission granted to share info w Contact Information: [phone]  Emotional Assessment Appearance:: Appears stated age Attitude/Demeanor/Rapport: Lethargic Affect (typically observed): Accepting Orientation: : Oriented to Self, Oriented to  Time, Oriented to Place, Oriented to Situation Alcohol / Substance Use: Not Applicable Psych Involvement: No (comment)  Admission diagnosis:  Diabetic foot ulcer ([place]) [W80.321, L97.509] Cellulitis of left foot  [  V95.638] Patient Active Problem List   Diagnosis Date Noted   Goals of care, counseling/discussion    Gangrene of left foot ([place])    Cellulitis of left foot    Diabetic foot ulcer ([place]) 09/04/2021   CHF (congestive heart failure) ([place]) 07/07/2021   Normocytic anemia 06/30/2021   CKD (chronic kidney disease), stage IV (HCC) 75/64/3329   Acute diastolic CHF (congestive heart failure) ([place]) 06/10/2021   Abnormal nuclear stress test    Dyspnea on exertion 03/07/2021   Orthopnea 02/25/2021   History of cerebrovascular accident (CVA) with residual deficit 05/06/2020   Diabetic peripheral neuropathy ([place]) 04/26/2020   AKI (acute kidney injury) ([place]) 04/20/2020   Generalized weakness 04/20/2020   Dehydration 04/20/2020   Generalized abdominal pain    Pancreatitis, acute 02/29/2020   Cerebrovascular accident (CVA) ([place]) 11/08/2019   Stenosis of right carotid artery 11/08/2019   Hyperlipidemia associated with type 2 diabetes mellitus ([place]) 11/08/2019   Cortical age-related cataract of both eyes 08/30/2019   Gait abnormality 08/30/2019   Statin declined 08/30/2019   Gastroesophageal reflux disease without esophagitis 04/18/2018   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   Uncontrolled type 2 diabetes mellitus with diabetic neuropathy, with long-term current use of insulin ([place]) 10/29/2017   History of macular degeneration 10/29/2017   Chronic cervical pain 10/29/2016   Myalgia 09/14/2016   Insomnia 09/14/2016   Tinea pedis of both feet 08/20/2016   Macular degeneration 08/17/2016   Low back pain 09/21/2014   Hypertension 09/21/2014   Diabetic gastroparesis associated with type 2 diabetes mellitus ([place]) 09/14/2014   Thyroid nodule 08/17/2014   Obesity (BMI 30-39.9) 08/17/2014   Hyperlipidemia 08/05/2014   Nausea & vomiting 08/04/2014   Type II diabetes mellitus with neurological manifestations, uncontrolled ([place]) 08/04/2014   PCP:  [name], MD Pharmacy:   [facility] [place] [address] [place] [address] Phone: [phone] Fax: [place] [address] [place] [address] Phone: [phone] Fax: [phone]  [id number] [place], [place] [address] Phone: [phone] Fax: [phone]     Social Determinants of Health ([place]) Interventions    Readmission Risk Interventions Readmission Risk Prevention Plan 03/07/2020  Transportation Screening Complete  PCP or Specialist Appt within 3-5 Days Complete  HRI or [place] (No Data)  Social Work Consult for [place] Planning/Counseling [place] Not Applicable  Medication Review Press photographer) Complete  Some recent data might be hidden

## 2021-09-15 NOTE — Progress Notes (Addendum)
Physical Therapy Treatment Patient Details Name: Nancy [name] MRN: [id number] DOB: 05-01-1961 Today's Date: 09/15/2021   History of Present Illness 60 y.o. female admitted on 9/19 with necrotic L heel.  MRI (+) for ulceration with possible cellulitis/myositis, (-) for OM.  Recent admission x 3 weeks ago for CHF; per pt, poor fitting hospital socks caused initial foot wound. Pt has refused MD recommendation of BK amp to LLE.  PMH includes anemia, depression, DM, peripheral neuropathy, HLD, HTN, CVA, CHF, HOH, CKD    PT Comments    Pt making good progress today with min cues for decreased weight on L heel.  L PRAFO walking boot did help maintain offloading heel.  Needs chair follow for safety and rest breaks.  [place] plan of care.     Recommendations for follow up therapy are one component of a multi-disciplinary discharge planning process, led by the attending physician.  Recommendations may be updated based on patient status, additional functional criteria and insurance authorization.  Follow Up Recommendations  SNF     Equipment Recommendations  None recommended by PT    Recommendations for Other Services       Precautions / Restrictions Precautions Precautions: Fall Precaution Comments: no WB orders but per notes "Offload" L LE Required Braces or Orthoses: Other Brace Other Brace: use walking PRAFO L with amb; B prevelon boots Restrictions Weight Bearing Restrictions: No LLE Weight Bearing:  (minimize pressure on L heel) Other Position/Activity Restrictions: Try to keep L heel off loaded during amb; walking PRAFO helps     Mobility  Bed Mobility Overal bed mobility: Needs Assistance Bed Mobility: Supine to Sit     Supine to sit: Supervision          Transfers Overall transfer level: Needs assistance Equipment used: Rolling walker (2 wheeled) Transfers: Sit to/from Stand Sit to Stand: Mod assist;Min guard;Min assist         General transfer comment:  Sit to stand x 6 during session.  From elevated bed x 3 with min guard.  From Recliner x 3 with min A initially but mod A with fatige; cues for hand placement  Ambulation/Gait Ambulation/Gait assistance: Min assist;+2 safety/equipment Gait Distance (Feet): 30 Feet (15', 30', 20') Assistive device: Rolling walker (2 wheeled) Gait Pattern/deviations: Step-to pattern;Decreased stance time - left;Decreased weight shift to left Gait velocity: decreased   General Gait Details: Step to L gait with shuffle pattern and cues for RW to limit weight on heel.  Chair follow with seated rest breaks   Stairs             Wheelchair Mobility    Modified Rankin (Stroke Patients Only)       Balance Overall balance assessment: Needs assistance Sitting-balance support: Feet supported Sitting balance-Leahy Scale: Good     Standing balance support: Bilateral upper extremity supported;Single extremity supported Standing balance-Leahy Scale: Fair Standing balance comment: Requiring RW for gait but could static stand without AD                            Cognition Arousal/Alertness: Awake/alert Behavior During Therapy: Flat affect Overall Cognitive Status: Within Functional Limits for tasks assessed                                      AROM R ankle pumps and circles x 10 with cues  Exercises  General Comments General comments (skin integrity, edema, etc.): Noted indention in L LE from PRAFO straps due to edema.  Loosened straps once positioned in chair.      Pertinent Vitals/Pain Pain Assessment: No/denies pain    Home Living                      Prior Function            PT Goals (current goals can now be found in the care plan section) Progress towards PT goals: Progressing toward goals    Frequency    Min 2X/week      PT Plan Current plan remains appropriate    Co-evaluation              AM-PAC PT "6 Clicks" Mobility    Outcome Measure  Help needed turning from your back to your side while in a flat bed without using bedrails?: A Little Help needed moving from lying on your back to sitting on the side of a flat bed without using bedrails?: A Little Help needed moving to and from a bed to a chair (including a wheelchair)?: A Little Help needed standing up from a chair using your arms (e.g., wheelchair or bedside chair)?: A Little Help needed to walk in hospital room?: A Little Help needed climbing 3-5 steps with a railing? : A Lot 6 Click Score: 17    End of Session Equipment Utilized During Treatment: Other (comment);Gait belt (PRAFO boot) Activity Tolerance: Patient tolerated treatment well Patient left: in chair;with call bell/phone within reach;with chair alarm set Nurse Communication: Mobility status PT Visit Diagnosis: Other abnormalities of gait and mobility (R26.89);Muscle weakness (generalized) (M62.81)     Time: 4163-8453 PT Time Calculation (min) (ACUTE ONLY): 20 min  Charges:  $Gait Training: 8-22 mins                     Abran Richard, PT Acute Rehab Services Pager [phone] [facility] [place] 09/15/2021, 11:49 AM

## 2021-09-15 NOTE — Progress Notes (Signed)
Pt has no complains today. Dressing was done on L foot, boots was placed. Pt stayed in the chair for hours. Pending placement.

## 2021-09-15 NOTE — Plan of Care (Signed)
  Problem: Education: Goal: Knowledge of General Education information will improve Description: Including pain rating scale, medication(s)/side effects and non-pharmacologic comfort measures 09/15/2021 0015 by Jule Ser, RN Outcome: Progressing 09/15/2021 0015 by Jule Ser, RN Outcome: Progressing   Problem: Health Behavior/Discharge Planning: Goal: Ability to manage health-related needs will improve 09/15/2021 0015 by Jule Ser, RN Outcome: Progressing 09/15/2021 0015 by Jule Ser, RN Outcome: Progressing   Problem: Clinical Measurements: Goal: Ability to maintain clinical measurements within normal limits will improve 09/15/2021 0015 by Jule Ser, RN Outcome: Progressing 09/15/2021 0015 by Jule Ser, RN Outcome: Progressing Goal: Will remain free from infection 09/15/2021 0015 by Jule Ser, RN Outcome: Progressing 09/15/2021 0015 by Jule Ser, RN Outcome: Progressing Goal: Diagnostic test results will improve 09/15/2021 0015 by Jule Ser, RN Outcome: Progressing 09/15/2021 0015 by Jule Ser, RN Outcome: Progressing Goal: Respiratory complications will improve 09/15/2021 0015 by Jule Ser, RN Outcome: Progressing 09/15/2021 0015 by Jule Ser, RN Outcome: Progressing Goal: Cardiovascular complication will be avoided 09/15/2021 0015 by Jule Ser, RN Outcome: Progressing 09/15/2021 0015 by Jule Ser, RN Outcome: Progressing   Problem: Activity: Goal: Risk for activity intolerance will decrease 09/15/2021 0015 by Jule Ser, RN Outcome: Progressing 09/15/2021 0015 by Jule Ser, RN Outcome: Progressing   Problem: Nutrition: Goal: Adequate nutrition will be maintained 09/15/2021 0015 by Jule Ser, RN Outcome: Progressing 09/15/2021 0015 by Jule Ser, RN Outcome: Progressing   Problem: Coping: Goal: Level of anxiety will decrease 09/15/2021 0015 by Jule Ser, RN Outcome: Progressing 09/15/2021 0015 by Jule Ser, RN Outcome: Progressing   Problem: Elimination: Goal: Will not experience complications related to bowel motility 09/15/2021 0015 by Jule Ser, RN Outcome: Progressing 09/15/2021 0015 by Jule Ser, RN Outcome: Progressing Goal: Will not experience complications related to urinary retention 09/15/2021 0015 by Jule Ser, RN Outcome: Progressing 09/15/2021 0015 by Jule Ser, RN Outcome: Progressing   Problem: Pain Managment: Goal: General experience of comfort will improve 09/15/2021 0015 by Jule Ser, RN Outcome: Progressing 09/15/2021 0015 by Jule Ser, RN Outcome: Progressing   Problem: Safety: Goal: Ability to remain free from injury will improve 09/15/2021 0015 by Jule Ser, RN Outcome: Progressing 09/15/2021 0015 by Jule Ser, RN Outcome: Progressing   Problem: Skin Integrity: Goal: Risk for impaired skin integrity will decrease 09/15/2021 0015 by Jule Ser, RN Outcome: Progressing 09/15/2021 0015 by Jule Ser, RN Outcome: Progressing

## 2021-09-16 LAB — BASIC METABOLIC PANEL
Anion gap: 8 (ref 5–15)
BUN: 45 mg/dL — ABNORMAL HIGH (ref 8–23)
CO2: 25 mmol/L (ref 22–32)
Calcium: 8.8 mg/dL — ABNORMAL LOW (ref 8.9–10.3)
Chloride: 104 mmol/L (ref 98–111)
Creatinine, Ser: 2.99 mg/dL — ABNORMAL HIGH (ref 0.44–1.00)
GFR, Estimated: 17 mL/min — ABNORMAL LOW (ref >60)
Glucose, Bld: 167 mg/dL — ABNORMAL HIGH (ref 70–99)
Potassium: 4.3 mmol/L (ref 3.5–5.1)
Sodium: 137 mmol/L (ref 135–145)

## 2021-09-16 LAB — GLUCOSE, CAPILLARY
Glucose-Capillary: 151 mg/dL — ABNORMAL HIGH (ref 70–99)
Glucose-Capillary: 247 mg/dL — ABNORMAL HIGH (ref 70–99)
Glucose-Capillary: 143 mg/dL — ABNORMAL HIGH (ref 70–99)
Glucose-Capillary: 173 mg/dL — ABNORMAL HIGH (ref 70–99)

## 2021-09-16 NOTE — Plan of Care (Signed)
  Problem: Education: Goal: Knowledge of General Education information will improve Description Including pain rating scale, medication(s)/side effects and non-pharmacologic comfort measures Outcome: Progressing   Problem: Nutrition: Goal: Adequate nutrition will be maintained Outcome: Progressing   Problem: Pain Managment: Goal: General experience of comfort will improve Outcome: Progressing   

## 2021-09-16 NOTE — Progress Notes (Signed)
Subjective:  Reports some nausea overnight no improvement with Zofran.  Says nausea has improved this morning.  Ate some pancakes for breakfast.  Not having worsening leg pain.  No acute complaints this morning.  Objective:  Vital signs in last 24 hours: Vitals:   09/15/21 0807 09/15/21 1450 09/15/21 2118 09/16/21 0700  BP: (!) 161/75 (!) 142/75 (!) 159/79 (!) 173/86  Pulse: 75 68 82 83  Resp: 17 17 16 19   Temp: 98.7 F (37.1 C) 98.5 F (36.9 C) 98.3 F (36.8 C) 98.5 F (36.9 C)  TempSrc: Oral Oral Oral Oral  SpO2: 99% 90% 97% 100%  Weight:      Height:        Intake/Output Summary (Last 24 hours) at 09/16/2021 1252 Last data filed at 09/16/2021 0900 Gross per 24 hour  Intake 720 ml  Output 1400 ml  Net -680 ml    Constitutional: African [place] female, laying in bed, in no acute distress HENT: Normocephalic and atraumatic, EOMI, conjunctiva normal, moist mucous membranes Cardiovascular: Normal rate, regular rhythm, S1 and S2 present, no murmurs, rubs, gallops.  2+ bilateral pitting edema to the thigh Respiratory: No respiratory distress, no accessory muscle use.  Effort is normal.  Lungs are clear to auscultation bilaterally. GI: Nondistended, soft, nontender to palpation, normal active bowel sounds Musculoskeletal: LE in boot, wound dressed and clean Neurological: Is alert and oriented x4, no apparent focal deficits noted. Skin: Warm and dry.  LE in  boot, wound appears dressed and clean Psychiatric: Normal mood and affect.   Assessment/Plan:  Active Problems:   AKI (acute kidney injury) ([place])   Diabetic foot ulcer (HCC)   Cellulitis of left foot   Gangrene of left foot (HCC)   Goals of care, counseling/discussion  Ms. Schemm is a 60 year old person living with HFpEF, CKD stage V, TIA, type 2 diabetes who presented for left foot pain and was admitted for gangrene  left heel ulcer secondary to diabetic nephropathy.   #Gangrenous Diabetic Foot Ulcer, heel of  left foot #Diabetic Foot Ulcer, lateral L foot #Left and Right Dorsal Foot Skin Lacerations #PAD Evaluated by vascular and orthopedic surgery recommending transtibial amputation however patient declined this.  Continue with wound care and medical management.  Currently on Doxy and Augmentin.  Currently awaiting SNF placement. Patient is medically stable for discharge. - Continue aspirin 81 mg daily, Lipitor 40 mg daily. - Blood cultures negative - Day 11/21 of antibiotics, continue Doxy and Augmentin last day of antibiotics 10/10 - Oxycodone 6mg  tablet q6 PRN and tylenol 650mg  q6 PRN for pain -Continue offloading left heel with boot  -Continue wound care -Pending charity SNF placement, Medicaid pending can take up to 90 days   #Acute on chronic HFpEF exacerbation Patient had echo 06/22 with EF of 55 to 60%.  No shortness of breath.  Continues to have good urine output. - Continue torsemide 20 mg daily, Coreg 12.5 mg twice daily - Strict I/Os daily weights   #CVA, noted in 2020, reported residual left-sided weakness #Cognitive impairment #Hx of cryptococcus meningitis Patient was continued on aspirin 81 mg daily and Lipitor 40 mg daily.  SLP performed cognitive evaluation during admission.  Family reports that since patient's prior CVA her cognition has been altered. During cognitive eval this admission patient scored 17 out of 30 indicating "dementia like" severity of cognitive impairment, with severe deficits in working memory problem solving sustained attention.  Patient also demonstrated paucity of verbal interaction.  SLP recommending SNF, patient  requiring assistance with all basic ADLs   #AKI on CKD Stage V #Oliguria #Anemia of CKD Recently worsening renal function secondary to diabetic nephropathy.no indication for dialysis currently but may need dialysis soon per nephrology recommendations.  However patient has declined AV fistula placement.  Some slow improvement GFR of 17 today  and BUN/creatinine of 45, 2.99 today. - Nephrology signed off -Torsemide 20 mg daily - Calcium phosphate binders - Darbepoetin alfa every 4 weeks - Iron supplementation - Strict I/O's, monitor renal function - Avoid contrast to kidney or nephrotoxic agents - Outpatient follow-up with nephrologist (has followed with Dr. Hollie Salk), discontinue discussed dialysis and AV graft placement with patient should her desire strain   #Poorly Controlled Type II DM #Diabetic neuropathy Last A1c of 6.8 - SSI moderate - Continue home duloxetine 20 mg daily   #History of tactile hallucinations -Continue risperidone 3 mg before bedtime   #HTN -Amlodipine 5 mg daily, carvedilol 12.5 mg twice daily,  Best Practice: Diet: Heart healthy diet IVF: None VTE: Heparin 5,000 units q8h Code: Full AB: Doxy + Augmentin both p.o.   Prior to Admission Living Arrangement: Home Anticipated Discharge Location: SNF Barriers to Discharge: SNF placement Dispo: Anticipated discharge in approximately 1-3 day(s).   Iona Beard, MD 09/16/2021, 12:47 PM Pager: [phone] After 5pm on weekdays and 1pm on weekends: On Call pager [phone]

## 2021-09-17 LAB — BASIC METABOLIC PANEL
Anion gap: 8 (ref 5–15)
BUN: 42 mg/dL — ABNORMAL HIGH (ref 8–23)
CO2: 27 mmol/L (ref 22–32)
Calcium: 9 mg/dL (ref 8.9–10.3)
Chloride: 101 mmol/L (ref 98–111)
Creatinine, Ser: 2.79 mg/dL — ABNORMAL HIGH (ref 0.44–1.00)
GFR, Estimated: 19 mL/min — ABNORMAL LOW (ref >60)
Glucose, Bld: 169 mg/dL — ABNORMAL HIGH (ref 70–99)
Potassium: 4.9 mmol/L (ref 3.5–5.1)
Sodium: 136 mmol/L (ref 135–145)

## 2021-09-17 LAB — CBC
HCT: 30.3 % — ABNORMAL LOW (ref 36.0–46.0)
Hemoglobin: 9 g/dL — ABNORMAL LOW (ref 12.0–15.0)
MCH: 27.4 pg (ref 26.0–34.0)
MCHC: 29.7 g/dL — ABNORMAL LOW (ref 30.0–36.0)
MCV: 92.1 fL (ref 80.0–100.0)
Platelets: 263 10*3/uL (ref 150–400)
RBC: 3.29 MIL/uL — ABNORMAL LOW (ref 3.87–5.11)
RDW: 15.9 % — ABNORMAL HIGH (ref 11.5–15.5)
WBC: 9.5 10*3/uL (ref 4.0–10.5)
nRBC: 0 % (ref 0.0–0.2)

## 2021-09-17 LAB — GLUCOSE, CAPILLARY
Glucose-Capillary: 191 mg/dL — ABNORMAL HIGH (ref 70–99)
Glucose-Capillary: 161 mg/dL — ABNORMAL HIGH (ref 70–99)
Glucose-Capillary: 159 mg/dL — ABNORMAL HIGH (ref 70–99)
Glucose-Capillary: 140 mg/dL — ABNORMAL HIGH (ref 70–99)
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
## 2021-09-17 NOTE — Progress Notes (Signed)
Subjective:  Patient examined at bedside.  States she is no longer having any nausea.  Does note she often has cold fingers and wears gloves for the past 3 years.  Denies any worsening foot pain although has little to no sensation of her feet secondary to diabetic neuropathy.  We showed pictures of her feet to her.  We also discussed risk and benefits of amputation versus continued medical treatment alone.  Discussed she may develop life-threatening infection from the gangrene in her leg.  She states if that were to happen she is unsure of what she would choose.  Discussed this with her more tomorrow morning.  Objective:  Vital signs in last 24 hours: Vitals:   09/16/21 0700 09/16/21 1500 09/16/21 2132 09/17/21 0527  BP: (!) 173/86 (!) 155/65 (!) 179/80 (!) 172/76  Pulse: 83 72 77 74  Resp: 19 17 20 16   Temp: 98.5 F (36.9 C) 98.3 F (36.8 C) 98.9 F (37.2 C) 98.8 F (37.1 C)  TempSrc: Oral Oral Oral Oral  SpO2: 100% 96% 95% 96%  Weight:      Height:        Intake/Output Summary (Last 24 hours) at 09/17/2021 1017 Last data filed at 09/16/2021 1700 Gross per 24 hour  Intake 480 ml  Output 2200 ml  Net -1720 ml    Constitutional: African [place] female, laying in bed, in no acute distress HENT: Normocephalic and atraumatic, EOMI, conjunctiva normal, moist mucous membranes Cardiovascular: Normal rate, regular rhythm, soft systolic murmur best heard at upper sternal border.  Bilateral lower extremity edema 1+ Respiratory: No respiratory distress, no accessory muscle use.  Effort is normal.  Lungs are clear to auscultation bilaterally. GI: Nondistended, soft, nontender to palpation, normal active bowel sounds Musculoskeletal: LE in boot, wound dressed and clean Neurological: Is alert and oriented x4, no apparent focal deficits noted. Skin: Patient has chronic appearing ulcers with dried yellow gold exudate on the dorsal of both feet.  Left big toe is red but blanchable.  Gangrenous  wounds on heels of bilateral feet that is boggy on palpation.  Patient left with elevated heels. Psychiatric: Normal mood and affect.   Assessment/Plan:  Active Problems:   AKI (acute kidney injury) ([place])   Diabetic foot ulcer (HCC)   Cellulitis of left foot   Gangrene of left foot (HCC)   Goals of care, counseling/discussion  Nancy [name] is a 60 year old person living with HFpEF, CKD stage V, TIA, type 2 diabetes who presented for left foot pain and was admitted for gangrene  left heel ulcer secondary to diabetic nephropathy.   #Gangrenous Diabetic Foot Ulcer, heel of left foot #Diabetic Foot Ulcer, lateral L foot #Left and Right Dorsal Foot Skin Lacerations #PAD Evaluated by vascular and orthopedic surgery recommending transtibial amputation however patient declined this. Bilateral gangrenous heel wound left worse than right.  Boggy to palpation concerning for poor healing. Currently awaiting SNF placement.  - Continue aspirin 81 mg daily, Lipitor 40 mg daily. - Day 12/21 of Doxy and Augmentin, last day of antibiotics 10/10 - Oxycodone 6mg  tablet q6 PRN and tylenol 650mg  q6 PRN for pain - Prevalon boots to continue offloading heels. -Continue wound care -Pending charity SNF placement, Medicaid pending can take up to 90 days   #Acute on chronic HFpEF exacerbation Patient had echo 06/22 with EF of 55 to 60%.  No shortness of breath.  Continues to have good urine output. 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  SLP performed cognitive evaluation during admission.  Family reports that since patient's prior CVA her cognition has been altered. During cognitive eval this admission patient scored 17 out of 30 indicating "dementia like" severity of cognitive  impairment, with severe deficits in working memory problem solving sustained attention.  Patient also demonstrated paucity of verbal interaction.  SLP recommending SNF, patient requiring assistance with all basic ADLs.  We will continue to have ongoing conversations with her regarding her medical care to help better determine her capacity to make medical decisions for self.   #AKI on CKD Stage V #Oliguria #Anemia of CKD Recently worsening renal function secondary to diabetic nephropathy. No indication for dialysis currently but may need dialysis soon per nephrology recommendations.  However patient has declined AV fistula placement.  Remains euvolemic on exam.  Continue good urine output.  With 1.4 L out in the last 24 hours. - Nephrology signed off -Torsemide 20 mg daily - Calcium phosphate binders - Darbepoetin alfa every 4 weeks - Iron supplementation - Strict I/O's, monitor renal function - Avoid contrast to kidney or nephrotoxic agents - Outpatient follow-up with nephrologist (has followed with Dr. Hollie Salk), discontinue discussed dialysis and AV graft placement with patient should her desire strain   #Poorly Controlled Type II DM #Diabetic neuropathy Last A1c of 6.8 - SSI moderate - Continue home duloxetine 20 mg daily   #History of tactile hallucinations -Continue risperidone 3 mg before bedtime   #HTN -Amlodipine 5 mg daily, carvedilol 12.5 mg twice daily,  Best Practice: Diet: Heart healthy diet IVF: None VTE: Heparin 5,000 units q8h Code: Full AB: Doxy + Augmentin both p.o.   Prior to Admission Living Arrangement: Home Anticipated Discharge Location: SNF Barriers to Discharge: SNF placement Dispo: Anticipated discharge in approximately 1-3 day(s).   Iona Beard, MD 09/17/2021, 6:44 AM Pager: [phone] After 5pm on weekdays and 1pm on weekends: On Call pager [phone]

## 2021-09-18 LAB — BASIC METABOLIC PANEL
Anion gap: 10 (ref 5–15)
BUN: 42 mg/dL — ABNORMAL HIGH (ref 8–23)
CO2: 22 mmol/L (ref 22–32)
Calcium: 8.7 mg/dL — ABNORMAL LOW (ref 8.9–10.3)
Chloride: 103 mmol/L (ref 98–111)
Creatinine, Ser: 2.72 mg/dL — ABNORMAL HIGH (ref 0.44–1.00)
GFR, Estimated: 19 mL/min — ABNORMAL LOW (ref >60)
Glucose, Bld: 119 mg/dL — ABNORMAL HIGH (ref 70–99)
Potassium: 4.6 mmol/L (ref 3.5–5.1)
Sodium: 135 mmol/L (ref 135–145)

## 2021-09-18 LAB — GLUCOSE, CAPILLARY
Glucose-Capillary: 104 mg/dL — ABNORMAL HIGH (ref 70–99)
Glucose-Capillary: 121 mg/dL — ABNORMAL HIGH (ref 70–99)
Glucose-Capillary: 153 mg/dL — ABNORMAL HIGH (ref 70–99)
Glucose-Capillary: 156 mg/dL — ABNORMAL HIGH (ref 70–99)

## 2021-09-18 MED ORDER — SENNOSIDES-DOCUSATE SODIUM 8.6-50 MG PO TABS
2.0000 | ORAL_TABLET | Freq: Every day | ORAL | Status: DC
  Administered 2021-09-18 – 2021-09-20 (×3): 2 via ORAL
  Filled 2021-09-18 (×3): qty 2

## 2021-09-18 MED ORDER — AMLODIPINE BESYLATE 10 MG PO TABS
10.0000 mg | ORAL_TABLET | Freq: Every day | ORAL | Status: DC
  Administered 2021-09-19 – 2021-09-21 (×3): 10 mg via ORAL
  Filled 2021-09-18 (×3): qty 1

## 2021-09-18 MED ORDER — SODIUM CHLORIDE 0.9 % IV SOLN
1000.0000 mg | Freq: Once | INTRAVENOUS | Status: AC
  Administered 2021-09-18: 1000 mg via INTRAVENOUS
  Filled 2021-09-18: qty 20

## 2021-09-18 MED ORDER — SODIUM CHLORIDE 0.9 % IV SOLN
25.0000 mg | Freq: Once | INTRAVENOUS | Status: AC
  Administered 2021-09-18: 25 mg via INTRAVENOUS
  Filled 2021-09-18: qty 0.5

## 2021-09-18 MED ORDER — AMLODIPINE BESYLATE 5 MG PO TABS
5.0000 mg | ORAL_TABLET | Freq: Every day | ORAL | Status: AC
  Administered 2021-09-18: 5 mg via ORAL
  Filled 2021-09-18: qty 1

## 2021-09-18 NOTE — Progress Notes (Signed)
Subjective: No acute overnight events. Patient was seen at bedside during rounds today. Nancy [name] reports feeling well. Nancy [name] denies fever, chills. SOB, or difficulty ambulating. She states she is keeping pressure off of her heels. No other complains or concerns at this time.   Objective:  Vital signs in last 24 hours: Vitals:   09/17/21 1143 09/17/21 2005 09/18/21 0741 09/18/21 1452  BP: (!) 161/78 (!) 161/70 (!) 171/75 (!) 159/65  Pulse: 75 81 78 78  Resp: 18 16 17 17   Temp: 98.5 F (36.9 C) 98.6 F (37 C) 98.9 F (37.2 C) 97.6 F (36.4 C)  TempSrc: Oral Oral Oral Oral  SpO2: 100% 100% 99% 96%  Weight:      Height:       Constitutional: alert, well-appearing, in no acute distress Cardiovascular: regular rate and rhythm, no m/r/g Pulmonary/Chest: normal work of breathing on room air, lungs clear to auscultation bilaterally Abdominal: soft, non-tender to palpation, non-distended MSK: normal bulk and tone  Neurological: alert & oriented x 3  Skin: Warm and dry.  LE in  boot, wound appears dressed and clean Psych: normal behavior, normal affect   Assessment/Plan:  Active Problems:   AKI (acute kidney injury) ([place])   Diabetic foot ulcer ([place])   Cellulitis of left foot   Gangrene of left foot (HCC)   Goals of care, counseling/discussion  Ms. Geisinger is a 60 year old person living with HFpEF, CKD stage V, TIA, type 2 diabetes who presented for left foot pain and was admitted for gangrene left heel ulcer secondary to diabetic nephropathy, and is medically stable for d/c pending SNF placement.    Bilateral gangrenous pressure foot Ulcer, heel of both feet  Left and Right Dorsal Foot Skin Lacerations PAD Evaluated by vascular and orthopedic surgery recommending transtibial amputation however patient declined this, opting for non-surgical healing. Nancy [name] expresses full understanding of tx options and risks of not having surgery. Bilateral gangrenous heel wound left worse than right. Boggy  to palpation concerning for poor healing. Nancy [name] denies any pain in the extremities. Plan to continue following up with wound care after discharge.  - Day 14/21 of Doxy and Augmentin, last day of antibiotics 10/10 - Continue aspirin 81 mg daily, Lipitor 40 mg daily. - Oxycodone 6mg  tablet q6 PRN and tylenol 650mg  q6 PRN for pain - Prevalon boots to continue offloading heels. - Continue wound care - Pending charity SNF placement, Medicaid pending can take up to 90 days   Acute on chronic HFpEF exacerbation Patient had echo 06/22 with EF of 55 to 60%. No SOB. Continues to have good urine output, ~1L today.  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 SLP recommending SNF, patient requiring assistance with all basic ADLs. We will continue to have ongoing conversations with her regarding her medical care to help better determine her capacity to make medical decisions for self, though she shows full understanding of diagnosis, treatment options, and risks vs benefits.    AKI on CKD IV Oliguria Anemia of CKD Recently worsening renal function secondary to diabetic nephropathy. No indication for dialysis currently but may need dialysis soon per nephrology recommendations. patient has declined AV fistula placement currently. Remains euvolemic on exam. S/p IV Iron dextran to improve her anemia and wound healing, given s/p 2 weeks of abx.  Continues good urine output with ~1L out in the last 24 hours  - Nephrology signed off -Torsemide 20 mg daily - Calcium phosphate binders - Darbepoetin alfa every 4 weeks - Iron supplementation - Strict I/O's, monitor renal function - Avoid contrast to kidney or nephrotoxic agents - Outpatient follow-up with nephrologist (has followed with Dr. Hollie Salk), discuss  dialysis and AV graft placement with patient should her desire change   Poorly Controlled Type II DM Diabetic neuropathy Last A1c of 6.8. Blood sugars well controlled 110-140s, for proper wound healing.  - SSI moderate - Continue home duloxetine 20 mg daily   History of tactile hallucinations -Continue risperidone 3 mg before bedtime   HTN SBP stable in 160s after increase in amlodipine 5 mg to 10 mg qd yesterday. Continue carvedilol 12.5 mg twice daily.    Best Practice: Diet: Heart healthy diet IVF: None VTE: Heparin 5,000 units q8h Code: Full AB: Doxy + Augmentin both p.o.  Signature: [name], MD  Internal Medicine Resident, PGY-1 [name]  Pager: [phone] After 5pm on weekdays and 1pm on weekends: On Call pager [phone]

## 2021-09-18 NOTE — Progress Notes (Signed)
Occupational Therapy Treatment Patient Details Name: Nancy [name] MRN: [id number] DOB: 1961-11-14 Today's Date: 09/18/2021   History of present illness 60 y.o. female admitted on 9/19 with necrotic L heel.  MRI (+) for ulceration with possible cellulitis/myositis, (-) for OM.  Recent admission x 3 weeks ago for CHF; per pt, poor fitting hospital socks caused initial foot wound. Pt has refused MD recommendation of BK amp to LLE.  PMH includes anemia, depression, DM, peripheral neuropathy, HLD, HTN, CVA, CHF, HOH, CKD   OT comments  Pt progressing towards acute OT goals. Focus of session was working on toileting goals. Pt able to walk bathroom distance and complete simulated toilet transfer with up to min A. Encouraged pt to call NT if she needs to void for assistance walking to/from the bathroom. Pt + dizziness with positional changes which resolved with rest break. D/c plan remains appropriate.    Recommendations for follow up therapy are one component of a multi-disciplinary discharge planning process, led by the attending physician.  Recommendations may be updated based on patient status, additional functional criteria and insurance authorization.    Follow Up Recommendations  SNF;Supervision/Assistance - 24 hour    Equipment Recommendations  3 in 1 bedside commode    Recommendations for Other Services      Precautions / Restrictions Precautions Precautions: Fall Precaution Comments: no WB orders but per notes "Offload" L LE Required Braces or Orthoses: Other Brace Other Brace: use walking PRAFO L with amb; B prevelon boots Restrictions Weight Bearing Restrictions: Yes LLE Weight Bearing: Non weight bearing Other Position/Activity Restrictions: Try to keep L heel off loaded during amb; walking PRAFO helps       Mobility Bed Mobility Overal bed mobility: Needs Assistance Bed Mobility: Supine to Sit     Supine to sit: Supervision     General bed mobility comments:  supervision for safety. extra time and effort    Transfers Overall transfer level: Needs assistance Equipment used: Rolling walker (2 wheeled) Transfers: Sit to/from Stand Sit to Stand: Min assist         General transfer comment: up to min A this session. to/from elevated EOB and recliner heights. cues for technique    Balance Overall balance assessment: Needs assistance Sitting-balance support: Feet supported Sitting balance-Leahy Scale: Good     Standing balance support: Bilateral upper extremity supported;Single extremity supported Standing balance-Leahy Scale: Fair Standing balance comment: Requiring RW for gait but could static stand without AD                           ADL either performed or assessed with clinical judgement   ADL Overall ADL's : Needs assistance/impaired                         Toilet Transfer: Min guard;Minimal assistance;Ambulation;RW Toilet Transfer Details (indicate cue type and reason): able to walk bathroom distance mostly at min guard level, min A for sit<>stand transfer. Encouraged pt to call NT when she needs to void for assistance walking to the bathroom.         Functional mobility during ADLs: Min guard;Minimal assistance General ADL Comments: Up to min A to steady. Pt completed bed mobility and walked bathroom distance. Up in recliner at end of session.     Vision       Perception     Praxis      Cognition Arousal/Alertness: Awake/alert Behavior During Therapy:  Flat affect Overall Cognitive Status: No family/caregiver present to determine baseline cognitive functioning Area of Impairment: Problem solving;Awareness;Safety/judgement;Following commands;Memory;Attention;Orientation                   Current Attention Level: Sustained Memory: Decreased short-term memory Following Commands: Follows one step commands with increased time Safety/Judgement: Decreased awareness of safety;Decreased  awareness of deficits Awareness: Anticipatory;Emergent Problem Solving: Slow processing;Requires verbal cues          Exercises     Shoulder Instructions       General Comments + dizzy with positional changes, resolved with rest break    Pertinent Vitals/ Pain       Pain Assessment: Faces Faces Pain Scale: Hurts a little bit Pain Location: [place] with effort Pain Descriptors / Indicators: Discomfort Pain Intervention(s): Monitored during session  Home Living                                          Prior Functioning/Environment              Frequency  Min 2X/week        Progress Toward Goals  OT Goals(current goals can now be found in the care plan section)  Progress towards OT goals: Progressing toward goals  Acute Rehab OT Goals Patient Stated Goal: Walk to window OT Goal Formulation: With patient Time For Goal Achievement: 09/21/21 Potential to Achieve Goals: Good ADL Goals Pt Will Perform Grooming: with supervision;sitting Pt Will Perform Lower Body Dressing: with min assist;sitting/lateral leans;with adaptive equipment;sit to/from stand Pt Will Transfer to Toilet: bedside commode;with min assist;stand pivot transfer Pt Will Perform Toileting - Clothing Manipulation and hygiene: with min assist;sitting/lateral leans;sit to/from stand Pt/caregiver will Perform Home Exercise Program: Increased strength;Both right and left upper extremity;With written HEP provided Additional ADL Goal #1: Pt will  complete bed mobility with min assist as precursor to ADLs.  Plan Discharge plan remains appropriate;Frequency remains appropriate    Co-evaluation                 AM-PAC OT "6 Clicks" Daily Activity     Outcome Measure   Help from another person eating meals?: A Little Help from another person taking care of personal grooming?: A Little Help from another person toileting, which includes using toliet, bedpan, or urinal?: A Lot Help  from another person bathing (including washing, rinsing, drying)?: A Lot Help from another person to put on and taking off regular upper body clothing?: A Lot Help from another person to put on and taking off regular lower body clothing?: A Lot 6 Click Score: 14    End of Session Equipment Utilized During Treatment: Rolling walker;Other (comment) (PRAFO)  OT Visit Diagnosis: Other abnormalities of gait and mobility (R26.89);Muscle weakness (generalized) (M62.81);Pain;Other symptoms and signs involving cognitive function   Activity Tolerance Patient tolerated treatment well;Patient limited by fatigue   Patient Left in chair;with call bell/phone within reach;with chair alarm set   Nurse Communication          Time: [phone] OT Time Calculation (min): 22 min  Charges: OT General Charges $OT Visit: 1 Visit OT Treatments $Self Care/Home Management : 8-22 mins  Tyrone Schimke, OT Acute Rehabilitation Services Pager: [phone] Office: [phone]   Nancy [name] 09/18/2021, 11:22 AM

## 2021-09-18 NOTE — Plan of Care (Signed)

## 2021-09-19 LAB — BASIC METABOLIC PANEL
Anion gap: 9 (ref 5–15)
BUN: 41 mg/dL — ABNORMAL HIGH (ref 8–23)
CO2: 28 mmol/L (ref 22–32)
Calcium: 9.3 mg/dL (ref 8.9–10.3)
Chloride: 99 mmol/L (ref 98–111)
Creatinine, Ser: 2.61 mg/dL — ABNORMAL HIGH (ref 0.44–1.00)
GFR, Estimated: 20 mL/min — ABNORMAL LOW (ref >60)
Glucose, Bld: 118 mg/dL — ABNORMAL HIGH (ref 70–99)
Potassium: 4.7 mmol/L (ref 3.5–5.1)
Sodium: 136 mmol/L (ref 135–145)

## 2021-09-19 LAB — GLUCOSE, CAPILLARY
Glucose-Capillary: 102 mg/dL — ABNORMAL HIGH (ref 70–99)
Glucose-Capillary: 166 mg/dL — ABNORMAL HIGH (ref 70–99)
Glucose-Capillary: 137 mg/dL — ABNORMAL HIGH (ref 70–99)
Glucose-Capillary: 126 mg/dL — ABNORMAL HIGH (ref 70–99)

## 2021-09-19 NOTE — Consult Note (Signed)
WOC Nurse Consult Note: Patient receiving care in 5N18. Reason for Consult: Reconsulted for bilateral foot wounds Wound type: Dr. Sharol Given and [facility] have seen the patient. Amputation surgery is needed, but patient refuses. Topical therapy at time time will not change. Orders for wound care are in the record.  I discussed this with Dr. Jerilynn Mages. Patel. No new interventions needed, no need for further WOC evaluation. Pressure Injury POA: Yes/No/NA Measurement: Wound bed: Drainage (amount, consistency, odor)  Periwound: Dressing procedure/placement/frequency: WOC nurse will not follow at this time.  Please re-consult the [place] team if needed.  Val Riles, RN, MSN, CWOCN, CNS-BC, pager [phone]

## 2021-09-19 NOTE — Progress Notes (Signed)
Physical Therapy Treatment Patient Details Name: Nancy [name] MRN: [id number] DOB: 08/13/1961 Today's Date: 09/19/2021   History of Present Illness 60 y.o. female admitted on 9/19 with necrotic L heel.  MRI (+) for ulceration with possible cellulitis/myositis, (-) for OM.  Recent admission x 3 weeks ago for CHF; per pt, poor fitting hospital socks caused initial foot wound. Pt has refused MD recommendation of BK amp to LLE.  PMH includes anemia, depression, DM, peripheral neuropathy, HLD, HTN, CVA, CHF, HOH, CKD    PT Comments    Patient progressing slowly towards PT goals. Improved ambulation distance with Min A and use of RW for support; noted to have LOB with turning need Mod A to prevent fall. Needs cues to offload left heel with mobility despite wearing PRAFO. Noted to have slow processing and poor awareness of safety/medical condition. Requires mod A to stand from low surfaces. Continues to be appropriate for SNF. Will follow.    Recommendations for follow up therapy are one component of a multi-disciplinary discharge planning process, led by the attending physician.  Recommendations may be updated based on patient status, additional functional criteria and insurance authorization.  Follow Up Recommendations  SNF     Equipment Recommendations  None recommended by PT    Recommendations for Other Services       Precautions / Restrictions Precautions Precautions: Fall Precaution Comments: no WB orders but per notes "Offload" L LE Required Braces or Orthoses: Other Brace Other Brace: use walking PRAFO L with amb; B prevalon boots Restrictions Weight Bearing Restrictions: Yes Other Position/Activity Restrictions: Try to keep L heel off loaded during amb; walking PRAFO helps     Mobility  Bed Mobility Overal bed mobility: Needs Assistance Bed Mobility: Supine to Sit     Supine to sit: Mod assist     General bed mobility comments: ASsist to scoot bottom to EOB with  increased time. "I am stuck in the hole."    Transfers Overall transfer level: Needs assistance Equipment used: Rolling walker (2 wheeled) Transfers: Sit to/from Stand Sit to Stand: Min assist;Mod assist         General transfer comment: Assist to power to standing with cues for hand placement/technique, stood from EOB x2, from toiletx1, more assist needed from toilet to rise, transferred to chair post ambulation.  Ambulation/Gait Ambulation/Gait assistance: Min assist;+2 safety/equipment;Mod assist Gait Distance (Feet): 80 Feet (+12' + 12') Assistive device: Rolling walker (2 wheeled) Gait Pattern/deviations: Step-to pattern;Decreased stance time - left;Decreased weight shift to left;Shuffle;Step-through pattern Gait velocity: decreased   General Gait Details: Slow, mildly unsteady gait with shuffling of LLE at times, cues to offload weight through heel. LOB with turning needing Mod A to correct. + dizziness, Sp02 97%.   Stairs             Wheelchair Mobility    Modified Rankin (Stroke Patients Only)       Balance Overall balance assessment: Needs assistance Sitting-balance support: Feet supported;No upper extremity supported Sitting balance-Leahy Scale: Good     Standing balance support: During functional activity Standing balance-Leahy Scale: Fair Standing balance comment: Able to stand at sink and wash hands with min A at times for balance. Needs UE support for walking.                            Cognition Arousal/Alertness: Awake/alert Behavior During Therapy: Flat affect Overall Cognitive Status: No family/caregiver present to determine baseline cognitive functioning  Area of Impairment: Problem solving;Awareness;Safety/judgement;Following commands;Memory;Attention                   Current Attention Level: Sustained Memory: Decreased short-term memory Following Commands: Follows one step commands with increased time Safety/Judgement:  Decreased awareness of safety;Decreased awareness of deficits Awareness: Emergent Problem Solving: Slow processing;Requires verbal cues General Comments: Slow processing, also HOH so needs repetition. Follows simple 1 step commands but difficulty with multi step commands.      Exercises      General Comments General comments (skin integrity, edema, etc.): Dizziness reported with walking/sitting up.      Pertinent Vitals/Pain Pain Assessment: Faces Faces Pain Scale: No hurt    Home Living                      Prior Function            PT Goals (current goals can now be found in the care plan section) Acute Rehab PT Goals Patient Stated Goal: get better PT Goal Formulation: With patient Time For Goal Achievement: 10/03/21 Potential to Achieve Goals: Good Progress towards PT goals: Progressing toward goals    Frequency    Min 2X/week      PT Plan Current plan remains appropriate    Co-evaluation              AM-PAC PT "6 Clicks" Mobility   Outcome Measure  Help needed turning from your back to your side while in a flat bed without using bedrails?: A Little Help needed moving from lying on your back to sitting on the side of a flat bed without using bedrails?: A Little Help needed moving to and from a bed to a chair (including a wheelchair)?: A Little Help needed standing up from a chair using your arms (e.g., wheelchair or bedside chair)?: A Lot Help needed to walk in hospital room?: A Little Help needed climbing 3-5 steps with a railing? : A Lot 6 Click Score: 16    End of Session Equipment Utilized During Treatment: Other (comment);Gait belt (PRAFO boot) Activity Tolerance: Patient tolerated treatment well Patient left: in chair;with call bell/phone within reach;with chair alarm set Nurse Communication: Mobility status PT Visit Diagnosis: Other abnormalities of gait and mobility (R26.89);Muscle weakness (generalized) (M62.81)     Time:  8563-1497 PT Time Calculation (min) (ACUTE ONLY): 23 min  Charges:  $Gait Training: 8-22 mins $Therapeutic Activity: 8-22 mins                     Marisa Severin, PT, DPT Acute Rehabilitation Services Pager [phone] Office [place] 09/19/2021, 11:30 AM

## 2021-09-19 NOTE — Progress Notes (Signed)
Subjective: No acute overnight events. Patient was seen at bedside during rounds today. Pt reports feeling well. Pt denies fever, chills. SOB, or difficulty ambulating. She states she is keeping pressure off of her heels. No other complains or concerns at this time.   Objective:  Vital signs in last 24 hours: Vitals:   09/18/21 1452 09/18/21 1952 09/19/21 0743 09/19/21 1342  BP: (!) 159/65 (!) 162/66 (!) 164/70 (!) 152/66  Pulse: 78 69 73 73  Resp: 17 18 17 15   Temp: 97.6 F (36.4 C) 98.6 F (37 C) 98.2 F (36.8 C) (!) 97.5 F (36.4 C)  TempSrc: Oral   Oral  SpO2: 96% 98% 95% 95%  Weight:      Height:       Constitutional: alert, well-appearing, in no acute distress Cardiovascular: regular rate and rhythm, no m/r/g Pulmonary/Chest: normal work of breathing on room air, lungs clear to auscultation bilaterally Abdominal: soft, non-tender to palpation, non-distended MSK: normal bulk and tone  Neurological: alert & oriented x 3  Skin: Warm and dry.  LE in  boot, wound appears dressed and clean Psych: normal behavior, normal affect   Assessment/Plan:  Active Problems:   AKI (acute kidney injury) ([place])   Diabetic foot ulcer ([place])   Cellulitis of left foot   Gangrene of left foot ([facility])   Goals of care, counseling/discussion  Nancy [name] is a 60 year old person living with HFpEF, CKD stage IV, TIA, type 2 diabetes who presented for left foot pain and was admitted for gangrene left heel ulcer secondary to diabetic nephropathy, and is medically stable for d/c pending SNF placement.    Bilateral gangrenous pressure foot Ulcer, heel of both feet  Left and Right Dorsal Foot Skin Lacerations PAD Evaluated by vascular and orthopedic surgery recommending transtibial amputation however patient declined this, opting for non-surgical healing. Pt expresses full understanding of tx options and risks of not having surgery. Bilateral gangrenous heel wound left worse than right. Boggy to  palpation concerning for poor healing. Pt denies any pain in the extremities. Plan to continue following up with wound care after discharge.  - Day 15/21 of Doxy and Augmentin, last day of antibiotics 10/10 - Continue aspirin 81 mg daily, Lipitor 40 mg daily. - Oxycodone 6mg  tablet q6 PRN and tylenol 650mg  q6 PRN for pain - Prevalon boots to continue offloading heels. - Continue wound care teams recommendations  - Pending charity SNF placement, Medicaid pending can take up to 90 days   Acute on chronic HFpEF exacerbation Patient had echo 06/22 with EF of 55 to 60%. No SOB. Continues to have good urine output, ~1.3 L today.  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 SLP recommending SNF, patient requiring assistance with all basic ADLs. We will continue to have ongoing conversations with her regarding her medical care to help better determine her capacity to make medical decisions for self, though she shows full understanding of diagnosis, treatment options, and risks vs benefits.    AKI on CKD IV Oliguria Anemia of CKD Recently worsening renal function secondary to diabetic nephropathy. No indication for dialysis currently but may need dialysis soon per nephrology recommendations. patient has declined AV fistula placement currently. Remains euvolemic on exam. S/p IV Iron dextran to improve her anemia and wound healing, given  s/p 2 weeks of abx. Continues good urine output with ~1.3 L out in the last 24 hours  - Nephrology signed off -Torsemide 20 mg daily - Calcium phosphate binders - Darbepoetin alfa every 4 weeks - Iron supplementation - Strict I/O's, monitor renal function - Avoid contrast to kidney or nephrotoxic agents - Outpatient follow-up with nephrologist (has followed  with Dr. Hollie Salk), discuss dialysis and AV graft placement with patient should her desire change   Poorly Controlled Type II DM Diabetic neuropathy Last A1c of 6.8. Blood sugars well controlled 110-140s, for proper wound healing.  - SSI moderate - Continue home duloxetine 20 mg daily   History of tactile hallucinations -Continue risperidone 3 mg before bedtime   HTN SBP stable in 160s  - Amlodipine 10 mg qd  - Carvedilol 12.5 mg bid.    Best Practice: Diet: Heart healthy diet IVF: None VTE: Heparin 5,000 units q8h Code: Full AB: Doxy + Augmentin both p.o.  Signature: [name], MD  Internal Medicine Resident, PGY-1 [name]  Pager: [phone] After 5pm on weekdays and 1pm on weekends: On Call pager [phone]

## 2021-09-20 LAB — GLUCOSE, CAPILLARY
Glucose-Capillary: 105 mg/dL — ABNORMAL HIGH (ref 70–99)
Glucose-Capillary: 156 mg/dL — ABNORMAL HIGH (ref 70–99)
Glucose-Capillary: 161 mg/dL — ABNORMAL HIGH (ref 70–99)
Glucose-Capillary: 141 mg/dL — ABNORMAL HIGH (ref 70–99)

## 2021-09-20 LAB — BASIC METABOLIC PANEL
Anion gap: 9 (ref 5–15)
BUN: 47 mg/dL — ABNORMAL HIGH (ref 8–23)
CO2: 27 mmol/L (ref 22–32)
Calcium: 9.3 mg/dL (ref 8.9–10.3)
Chloride: 101 mmol/L (ref 98–111)
Creatinine, Ser: 2.72 mg/dL — ABNORMAL HIGH (ref 0.44–1.00)
GFR, Estimated: 19 mL/min — ABNORMAL LOW (ref >60)
Glucose, Bld: 102 mg/dL — ABNORMAL HIGH (ref 70–99)
Potassium: 4.6 mmol/L (ref 3.5–5.1)
Sodium: 137 mmol/L (ref 135–145)

## 2021-09-20 NOTE — Plan of Care (Signed)

## 2021-09-20 NOTE — Progress Notes (Signed)
Occupational Therapy Treatment Patient Details Name: CIA GARRETSON MRN: [id number] DOB: 1961-01-17 Today's Date: 09/20/2021   History of present illness 60 y.o. female admitted on 9/19 with necrotic L heel.  MRI (+) for ulceration with possible cellulitis/myositis, (-) for OM.  Recent admission x 3 weeks ago for CHF; per pt, poor fitting hospital socks caused initial foot wound. Pt has refused MD recommendation of BK amp to LLE.  PMH includes anemia, depression, DM, peripheral neuropathy, HLD, HTN, CVA, CHF, HOH, CKD   OT comments  Patient received seated in recliner and agreeable to participate with OT treatment. Patient asked to use bathroom and required min assist to power up from recliner and min guard to walk to bathroom with RW. Patient required min assist to safely lower to toilet and to complete toilet hygiene. Patient performed hand hygiene standing at sink. Began UE HEP with theraband exercises for BUE shoulder flexion and abduction and elbow flexion/extension. Acute OT to continue to follow.    Recommendations for follow up therapy are one component of a multi-disciplinary discharge planning process, led by the attending physician.  Recommendations may be updated based on patient status, additional functional criteria and insurance authorization.    Follow Up Recommendations  SNF;Supervision/Assistance - 24 hour    Equipment Recommendations  3 in 1 bedside commode    Recommendations for Other Services      Precautions / Restrictions Precautions Precautions: Fall Precaution Comments: no WB orders but per notes "Offload" L LE Required Braces or Orthoses: Other Brace Other Brace: use walking PRAFO L with amb; B prevalon boots Restrictions Weight Bearing Restrictions: Yes LLE Weight Bearing: Weight bearing as tolerated       Mobility Bed Mobility Overal bed mobility: Needs Assistance Bed Mobility: Sit to Supine       Sit to supine: Mod assist   General bed  mobility comments: patient required verbal and tactile cues for rail use and requried assistance with BLEs    Transfers Overall transfer level: Needs assistance Equipment used: Rolling walker (2 wheeled) Transfers: Sit to/from Stand Sit to Stand: Min guard;Min assist Stand pivot transfers: Min guard;Min assist       General transfer comment: min assist to power up from recliner    Balance Overall balance assessment: Needs assistance Sitting-balance support: Feet supported;No upper extremity supported Sitting balance-Leahy Scale: Good     Standing balance support: During functional activity Standing balance-Leahy Scale: Fair Standing balance comment: min guard to wash hands standing at sink                           ADL either performed or assessed with clinical judgement   ADL Overall ADL's : Needs assistance/impaired     Grooming: Min guard;Standing;Wash/dry hands Grooming Details (indicate cue type and reason): washed hands standing at sink                 Toilet Transfer: Min guard;Minimal assistance;Ambulation;RW Toilet Transfer Details (indicate cue type and reason): min guard to ambulate to bathroom, min assist for hand placement to lower to toilet Toileting- Clothing Manipulation and Hygiene: Minimal assistance Toileting - Clothing Manipulation Details (indicate cue type and reason): Min A with hygeine for thoroughness     Functional mobility during ADLs: Min guard;Minimal assistance General ADL Comments: min assist to power up from [place]  Cognition Arousal/Alertness: Awake/alert Behavior During Therapy: Flat affect Overall Cognitive Status: No family/caregiver present to determine baseline cognitive functioning Area of Impairment: Problem solving;Awareness;Safety/judgement;Following commands;Memory;Attention                 Orientation Level: Situation;Time Current Attention Level:  Sustained Memory: Decreased short-term memory Following Commands: Follows one step commands with increased time Safety/Judgement: Decreased awareness of safety;Decreased awareness of deficits Awareness: Emergent Problem Solving: Slow processing;Requires verbal cues General Comments: requires cues for safety        Exercises Exercises: General Upper Extremity General Exercises - Upper Extremity Shoulder Flexion: Strengthening;Both;10 reps;Seated;Theraband Theraband Level (Shoulder Flexion): Level 2 (Red) Shoulder ABduction: Strengthening;Both;10 reps;Seated;Theraband Theraband Level (Shoulder Abduction): Level 2 (Red) Elbow Flexion: Strengthening;Both;10 reps;Seated;Theraband Theraband Level (Elbow Flexion): Level 2 (Red) Elbow Extension: Strengthening;Both;10 reps;Seated;Theraband Theraband Level (Elbow Extension): Level 2 (Red)   Shoulder Instructions       General Comments      Pertinent Vitals/ Pain       Pain Assessment: No/denies pain  Home Living                                          Prior Functioning/Environment              Frequency  Min 2X/week        Progress Toward Goals  OT Goals(current goals can now be found in the care plan section)  Progress towards OT goals: Progressing toward goals  Acute Rehab OT Goals Patient Stated Goal: get better OT Goal Formulation: With patient Time For Goal Achievement: 09/21/21 Potential to Achieve Goals: Good ADL Goals Pt Will Perform Grooming: with supervision;sitting Pt Will Perform Lower Body Dressing: with min assist;sitting/lateral leans;with adaptive equipment;sit to/from stand Pt Will Transfer to Toilet: bedside commode;with min assist;stand pivot transfer Pt Will Perform Toileting - Clothing Manipulation and hygiene: with min assist;sitting/lateral leans;sit to/from stand Pt/caregiver will Perform Home Exercise Program: Increased strength;Both right and left upper extremity;With  written HEP provided Additional ADL Goal #1: Pt will  complete bed mobility with min assist as precursor to ADLs.  Plan Discharge plan remains appropriate;Frequency remains appropriate    Co-evaluation                 AM-PAC OT "6 Clicks" Daily Activity     Outcome Measure   Help from another person eating meals?: A Little Help from another person taking care of personal grooming?: A Little Help from another person toileting, which includes using toliet, bedpan, or urinal?: A Little Help from another person bathing (including washing, rinsing, drying)?: A Lot Help from another person to put on and taking off regular upper body clothing?: A Lot Help from another person to put on and taking off regular lower body clothing?: A Lot 6 Click Score: 15    End of Session Equipment Utilized During Treatment: Gait belt;Rolling walker  OT Visit Diagnosis: Other abnormalities of gait and mobility (R26.89);Muscle weakness (generalized) (M62.81);Pain;Other symptoms and signs involving cognitive function   Activity Tolerance Patient tolerated treatment well;Patient limited by fatigue   Patient Left in bed;with call bell/phone within reach;with bed alarm set   Nurse Communication Mobility status        Time: 1430-1502 OT Time Calculation (min): 32 min  Charges: OT General Charges $OT Visit: 1 Visit OT Treatments $Self Care/Home Management : 8-22 mins $Therapeutic Exercise: 8-22 mins  Lodema Hong,  OTA   Nicolis Boody Alexis Goodell 09/20/2021, 3:19 PM

## 2021-09-21 ENCOUNTER — Other Ambulatory Visit: Payer: Self-pay

## 2021-09-21 LAB — GLUCOSE, CAPILLARY
Glucose-Capillary: 104 mg/dL — ABNORMAL HIGH (ref 70–99)
Glucose-Capillary: 113 mg/dL — ABNORMAL HIGH (ref 70–99)

## 2021-09-21 MED ORDER — SENNOSIDES-DOCUSATE SODIUM 8.6-50 MG PO TABS
2.0000 | ORAL_TABLET | Freq: Every day | ORAL | 0 refills | Status: AC
  Filled 2021-09-21: qty 60, 30d supply, fill #0

## 2021-09-21 MED ORDER — AMOXICILLIN-POT CLAVULANATE 250-125 MG PO TABS
1.0000 | ORAL_TABLET | Freq: Two times a day (BID) | ORAL | 0 refills | Status: AC
  Filled 2021-09-21: qty 10, 5d supply, fill #0

## 2021-09-21 MED ORDER — AMOXICILLIN-POT CLAVULANATE 500-125 MG PO TABS
1.0000 | ORAL_TABLET | Freq: Two times a day (BID) | ORAL | 0 refills | Status: DC
  Filled 2021-09-21: qty 10, 5d supply, fill #0

## 2021-09-21 MED ORDER — DOXYCYCLINE HYCLATE 100 MG PO TABS
100.0000 mg | ORAL_TABLET | Freq: Two times a day (BID) | ORAL | 0 refills | Status: AC
  Filled 2021-09-21: qty 10, 5d supply, fill #0

## 2021-09-21 NOTE — TOC Progression Note (Addendum)
Transition of Care [facility]) - Progression Note    Patient Details  Name: Nancy [name] MRN: [id number] Date of Birth: 1961-10-27  Transition of Care [facility]) CM/SW Contact  Curlene Labrum, RN Phone Number: 09/21/2021, 9:11 AM  Clinical Narrative:    Case management met with the patient at the bedside to discuss transitions of care.  The patient lives with her husband at the home.  The patient is ambulating 80 ft with rolling walker and has all available DME at the home including rolling walker and 3:1.  The patient's husband works 3 pm - 11 pm and is able to provide transportation to appointments and assist with simple dressing changes as ordered by the physician.  Bedside nursing will need to instruct the husband regarding ordered discharge dressings changes to lower legs and sacral wound and provide appropriate dressing change supplies since the patient does not have available insurance to assist with expense.  CM will call the patient's husband and discuss the discharge plan to home.  CM called and spoke with Dietrich Pates, [place] with [facility] and she will call back to confirm home health services for the patient set up 1 week ago prior to conversation with the patient's husband.  The patient did not have a preference regarding palliative care company follow up - CM called and spoke with Christlyn Edison Pace, CM with [facility] and referral placed for outpatient follow up at the home.  The patient will need discharge medications provided through [place] to assist the patient's husband with medications for home.  CM and MSW with DTP Team will continue to follow the patient for plans for discharge to home.  09/21/2021 1014 - Called and spoke with Harley Alto, [place] at [facility] and she has confirmed that [facility] has accepted the patient for home health services.  I called and left a voicemail with the patient's husband to discuss discharge planning for home.     Expected Discharge Plan: [place] Barriers to Discharge: Inadequate or no insurance (waiting for home health confirmation  - plan to discharge home with [facility] / palliative follow up outpatient)  Expected Discharge Plan and Services Expected Discharge Plan: [place] In-house Referral: Clinical Social Work, Hospice / Palliative Care, PCP / [facility] Discharge Planning Services: Medication Assistance, Follow-up appt scheduled, CM Consult, [place] Acute Care Choice: Durable Medical Equipment, Home Health (palliative care outpatient) Living arrangements for the past 2 months: Apartment Expected Discharge Date: 09/09/21               DME Arranged:  (dressing supplies supplied by nursing unit prior to discharge to home.) DME Agency: NA       HH Arranged: RN, PT, Social Work [employer] Agency: [facility] Date HH Agency Contacted: 09/21/21 Time HH Agency Contacted: 0830 Representative spoke with at [place]: Dietrich Pates, [place] at [place] ([place]) Interventions    Readmission Risk Interventions Readmission Risk Prevention Plan 09/21/2021 03/07/2020  Transportation Screening Complete Complete  PCP or Specialist Appt within 3-5 Days - Complete  HRI or [place] - (No Data)  Social Work Consult for [place] Planning/Counseling - [place] - Not Applicable  Medication Review Press [employer]) Complete Complete  PCP or Specialist appointment within 3-5 days of discharge Complete -  HRI or Home Care Consult Complete -  SW Recovery Care/Counseling Consult Complete -  Palliative Care Screening Complete -  Skilled Nursing Facility Not Applicable -  Some recent data might be hidden

## 2021-09-21 NOTE — Progress Notes (Signed)
[facility] ([facility])  Hospital Liaison RN note         Notified by TOC manager of patient/family request for [facility] Palliative services at home after discharge.              [facility] Palliative team will follow up with patient after discharge.         Please call with any hospice or palliative related questions.         Thank you for the opportunity to participate in this patient's care.     Chrislyn King, BSN, RN [facility] Hospital Liaison (listed on [facility] under Hospice/[facility])    [phone] (24h on call)    

## 2021-09-21 NOTE — Discharge Summary (Signed)
Name: Nancy [name] MRN: [id number] DOB: Feb 12, 1961 60 y.o. PCP: [name], MD  Date of Admission: 09/04/2021 11:33 AM Date of Discharge:  09/21/21 Attending Physician: Dr. Angelia Mould  DISCHARGE DIAGNOSIS:  Primary Problem: Bilateral diabetic heel ulcer   Hospital Problems: Active Problems:   AKI (acute kidney injury) ([facility])   Diabetic foot ulcer ([facility])   Cellulitis of left foot   Gangrene of left foot ([facility])   Goals of care, counseling/discussion    DISCHARGE MEDICATIONS:   Allergies as of 09/21/2021       Reactions   Elemental Sulfur Hives, Other (See Comments)   PATIENT STATED THIS, MORE THAN LIKELY, SHOULD HAVE BEEN LOGGED AS "SULFA"   Hydralazine Hcl Other (See Comments)   Hair loss   Hydrocodone Itching, Other (See Comments)   Upset stomach   Metformin And Related Nausea And Vomiting, Other (See Comments)   Stomach pains, also   Other Nausea Only, Other (See Comments)   Lettuce- Does not digest this!!   Plaquenil [hydroxychloroquine Sulfate] Hives   Shellfish-derived Products Nausea Only, Other (See Comments)   Caused an upset stomach   Shrimp (diagnostic) Nausea Only, Other (See Comments)   Upset stomach    Sulfa Antibiotics Hives        Medication List     TAKE these medications    acetaminophen 500 MG tablet Commonly known as: TYLENOL Take 500 mg by mouth every 6 (six) hours as needed for moderate pain or headache.   amLODipine 5 MG tablet Commonly known as: NORVASC Take 1 tablet (5 mg total) by mouth daily.   amoxicillin-clavulanate 250-125 MG tablet Commonly known as: AUGMENTIN Take 1 tablet by mouth every 12 (twelve) hours for 5 days.   aspirin EC 81 MG tablet Take 1 tablet (81 mg total) by mouth daily.   atorvastatin 40 MG tablet Commonly known as: LIPITOR Take 1 tablet (40 mg total) by mouth daily.   calcium acetate 667 MG capsule Commonly known as: PHOSLO Take 1 capsule (667 mg total) by mouth 3 (three) times daily with  meals.   carvedilol 12.5 MG tablet Commonly known as: COREG Take 1 tablet (12.5 mg total) by mouth 2 (two) times daily with a meal.   doxycycline 100 MG tablet Commonly known as: VIBRA-TABS Take 1 tablet (100 mg total) by mouth every 12 (twelve) hours for 5 days.   DULoxetine 20 MG capsule Commonly known as: CYMBALTA TAKE 1 CAPSULE (20 MG TOTAL) BY MOUTH DAILY.   FeroSul 325 (65 FE) MG tablet Generic drug: ferrous sulfate Take 1 tablet (325 mg total) by mouth daily with breakfast.   glucose blood test strip Use as instructedCheck blood sugar three times daily. E11.40   insulin glargine 100 UNIT/ML Solostar Pen Commonly known as: LANTUS Inject 55 Units into the skin at bedtime.   insulin lispro 100 UNIT/ML KwikPen Commonly known as: HumaLOG KwikPen Inject 4 units before breakfast and dinner. What changed:  how much to take how to take this when to take this additional instructions   OneTouch Verio w/Device Kit Check blood sugar three times daily. E11.40   True Metrix Meter w/Device Kit Check blood sugar three times daily. E11.40   risperiDONE 3 MG tablet Commonly known as: RISPERDAL TAKE 1 TABLET (3 MG TOTAL) BY MOUTH AT BEDTIME.   senna-docusate 8.6-50 MG tablet Commonly known as: Senokot-S Take 2 tablets by mouth at bedtime.   torsemide 20 MG tablet Commonly known as: DEMADEX Take 2 tablets (40  mg total) by mouth daily.               Durable Medical Equipment  (From admission, onward)           Start     Ordered   09/08/21 1521  For home use only DME 3 n 1  Once        09/08/21 1522              Discharge Care Instructions  (From admission, onward)           Start     Ordered   09/21/21 0000  Discharge wound care:       Comments: As taught by wound care nurse during day of discharge   09/21/21 1143            DISPOSITION AND FOLLOW-UP:  Ms.Jazmin H Nunziato was discharged from [facility] in stable  condition. At the hospital follow up visit please address:  Follow-up Recommendations: Consults: none Labs: none  Studies: none  Medications: complete 5 days of doxycycline and Augmentin    Follow-up Appointments:  Follow-up Information     [facility]. Go on 10/06/2021.   Why: You are scheduled for an appointment with Dr. [name] on Friday, 10/06/2021 at 0930 am. Contact information: [place] [address] [phone]        [facility]             . Call.   Contact information: [address] [address] [place] Follow up.   Specialty: Home Health Services Why: home health services will be provided by [facility] , start of care within 48 hrs post discharge Contact information: [address] [place] [address] [phone]         Newt Minion, MD Follow up today.   Specialty: Orthopedic Surgery Why: As needed, If symptoms worsen Contact information: [place] [address] [phone]         [facility] Follow up.   Specialty: PALLIATIVE CARE Why: [facility] palliatiative care will follow up with you at the home for support. Contact information: [place]        [name], MD. Schedule an appointment as soon as possible for a visit in 1 week(s).   Specialty: Internal Medicine Contact information: [place] [address] [phone]         [name], MD .   Specialties: Cardiology, Radiology Contact information: [address] [place] [address] [phone]         Larey Dresser, MD .   Specialty: Cardiology Contact information: [place] [address] [phone]                  HOSPITAL COURSE:  Patient Summary: Nancy [name] is a 60 year old female with a past medical history of HFpEF with a pertinent family history of CHF in both parents, CKD Stage IV, TIA, and Type II DM who presented with left foot pain and was admitted for a left heel ulcer secondary to diabetic neuropathy.   #Gangrenous Diabetic Foot Ulcer, heel of left foot #Diabetic Foot Ulcer, lateral L foot #Left and Right Dorsal Foot Skin Lacerations #PAD Patient  has diabetic foot ulcers in the setting of diabetic neuropathy and is uncertain how long they have been present.  Difficulty with ambulation due to pain. MRI without evidence of osteomyelitis.  Patient presented to the ED on 09/19, and was given broad-spec abx for gangrenous ulcer and concern for OM. ABIs completed, and the patient has moderate bilateral LE arterial disease.  Treated with IV antibiotics.  Vascular and Ortho recommend transtibial amputation given the low likelihood of her wound to heal.  Patient declining at this time and would like to try and salvage her left lower extremity with oral antibiotics and wound care.  It was recommended that patient go to a SNF however she is declining SNF placement at this time.  Palliative care met with her and she did not want to discuss her thoughts and feelings about all that has occurred, but she gave them permission to speak with her husband. Her husband supported her decisions regarding amputation but expressed his inability to care for her at home.  He believes SNF placement would be better for her, especially considering he works from 3-11pm, but they do not have insurance (medicaid pending). Home health set up and patient to be discharged home.  Patient will discharge with a 5 day course of oral doxycycline and Augmentin. She also discharged with Prevalon heel boots to help offload pressure.  She also be set up with orthopedic surgery for wound care and continued discussions regarding  possible amputation. Will continue her home aspirin 81 mg daily, Lipitor 40 mg daily.   #Acute on chronic HFpEF exacerbation Patient presented with lower extremity edema concerning for acute on chronic HFpEF exacerbation, which has improved with lasix. Patient had echo 06/22 with EF of 55 to 60%.  She was started back on her home torsemide dose which she will continue on discharge.  She will also continue her home Coreg.  #AKI on CKD Stage V #Oliguria #Anemia of CKD Patient has progressive renal insufficiency likely secondary to diabetic nephropathy. Patient had worsening kidney function this admission with creatinine 5.2 on 9/21.  UA without concerns for infection or casts.  Urine sodium <10.  AKI possibly due to cardiorenal type I, though BUN/CR more consistent with intrinsic kidney damage.  Renal ultrasound normal.  PTH elevated 113.  Patient continues to have oliguria. -Nephrology consulted this hospital admission and stated that patient would likely need AV fistula and hemodialysis. This was discussed with patient and she is currently not interested in hemodialysis.  Nephrology concerned about her dramatic decline in renal function over the last 12 months and progression from CKD Stage IV to Stage V, but there is no absolute indication for urgent or emergent need for hemodialysis at this time.  Nephrology believes her kidney function is stabilizing at CKD Stage V. Patient received a one-time dose of darbepoetin this admission.  She will discharge home with a phosphate binder and will continue her home torsemide for hypervolemia.  She needs to schedule an appointment in outpatient setting with nephrology.  #Poorly Controlled Type II DM #Diabetic neuropathy Patient monitors her blood sugar once daily before meals, and her home sugars have been between 178-197. Most recent A1c is 6.8. Her husband says she has been eating well in the hospital.  Blood sugars appropriate this admission. She was  continued on her home lantus and humalog + SSI And her home duloxetine 20 mg daily for her neuropathy.  She was discharged on these medications   #History of tactile hallucinations Patient was  continued on risperidone 3 mg nightly while inpatient.   #HTN Patient's blood pressure medications, Amlodipine 5 mg daily and carvedilol 12.5 mg twice daily, were continued. SBPs 160s and increased amlodipine 5 to 10. Otherwise, whe was mostly normotensive while inpatient. She will discharge on her home BP medications.    DISCHARGE INSTRUCTIONS:   Discharge Instructions     AMB referral to wound care center   Complete by: As directed    Gangrenous diabetic foot ulcer   Call MD for:  difficulty breathing, headache or visual disturbances   Complete by: As directed    Call MD for:  extreme fatigue   Complete by: As directed    Call MD for:  hives   Complete by: As directed    Call MD for:  persistant dizziness or light-headedness   Complete by: As directed    Call MD for:  persistant nausea and vomiting   Complete by: As directed    Call MD for:  redness, tenderness, or signs of infection (pain, swelling, redness, odor or green/yellow discharge around incision site)   Complete by: As directed    Call MD for:  severe uncontrolled pain   Complete by: As directed    Call MD for:  temperature >100.4   Complete by: As directed    Diet - low sodium heart healthy   Complete by: As directed    Discharge wound care:   Complete by: As directed    As taught by wound care nurse during day of discharge   Increase activity slowly   Complete by: As directed        SUBJECTIVE:  No acute overnight events. Patient was seen at bedside during rounds today. Pt reports feeling well. Pt denies fever, chills. SOB, or difficulty ambulating. She states she is keeping pressure off of her heels. No other complains or concerns at this time.    Social worker has extensive conversation with patient regarding  discharge and plans for wounds care. Plan is for patient to go home with [facility].  Discharge Vitals:   BP (!) 150/66 (BP Location: Left Arm)   Pulse 73   Temp 99.1 F (37.3 C) (Oral)   Resp 18   Ht $R'5\' 4"'LK$  (1.626 m)   Wt 105.2 kg   SpO2 92%   BMI 39.81 kg/m   OBJECTIVE:  Constitutional: alert, well-appearing, in no acute distress Cardiovascular: regular rate and rhythm, no m/r/g Pulmonary/Chest: normal work of breathing on room air, lungs clear to auscultation bilaterally Abdominal: soft, non-tender to palpation, non-distended MSK: normal bulk and tone  Neurological: alert & oriented x 3  Skin: Warm and dry. LE in  boot, wound appears dressed and clean Psych: normal behavior, normal affect  Pertinent Labs, Studies, and Procedures:  CBC Latest Ref Rng & Units 09/17/2021 09/15/2021 09/13/2021  WBC 4.0 - 10.5 K/uL 9.5 11.4(H) 14.9(H)  Hemoglobin 12.0 - 15.0 g/dL 9.0(L) 9.3(L) 8.6(L)  Hematocrit 36.0 - 46.0 % 30.3(L) 30.0(L) 28.3(L)  Platelets 150 - 400 K/uL 263 318 355    CMP Latest Ref Rng & Units 09/20/2021 09/19/2021 09/18/2021  Glucose 70 - 99 mg/dL 102(H) 118(H) 119(H)  BUN 8 - 23 mg/dL 47(H) 41(H) 42(H)  Creatinine 0.44 - 1.00 mg/dL 2.72(H) 2.61(H) 2.72(H)  Sodium 135 - 145 mmol/L 137 136 135  Potassium 3.5 - 5.1 mmol/L 4.6 4.7 4.6  Chloride 98 - 111 mmol/L 101 99 103  CO2 22 - 32 mmol/L $RemoveB'27 28 22  'sLahoESK$ Calcium  8.9 - 10.3 mg/dL 9.3 9.3 8.7(L)  Total Protein 6.5 - 8.1 g/dL - - -  Total Bilirubin 0.3 - 1.2 mg/dL - - -  Alkaline Phos 38 - 126 U/L - - -  AST 15 - 41 U/L - - -  ALT 0 - 44 U/L - - -    MR FOOT LEFT WO CONTRAST  Result Date: 09/04/2021 CLINICAL DATA:  Diabetic left heel wound EXAM: MRI OF THE LEFT FOOT WITHOUT CONTRAST TECHNIQUE: Multiplanar, multisequence MR imaging of the left hindfoot was performed. No intravenous contrast was administered. COMPARISON:  X-ray 09/04/2021 FINDINGS: Technical Note: Despite efforts by the technologist and patient, motion  artifact is present on today's exam and could not be eliminated. This reduces exam sensitivity and specificity. Bones/Joint/Cartilage No acute fracture or dislocation. No bony erosion or marrow replacement. No bone marrow edema. No joint effusion. No significant arthropathy of the ankle or hindfoot. Ligaments Ligamentous structures about the ankle appear grossly intact within the limitations of this motion limited examination. Muscles and Tendons Diffuse edema of the lower leg and foot musculature which may reflect denervation changes and/or myositis. Tendinous structures appear intact. No tenosynovitis. Soft tissues Plantar ulceration underlying the posterior calcaneus. Diffuse soft tissue edema about the ankle and hindfoot. No organized or drainable fluid collections. IMPRESSION: 1. No evidence of osteomyelitis of the left hindfoot. 2. Plantar ulceration underlying the posterior calcaneus. Diffuse soft tissue edema about the ankle and hindfoot which may reflect cellulitis. No organized or drainable fluid collections. 3. Diffuse edema of the lower leg and foot musculature which may reflect denervation changes and/or myositis. Electronically Signed   By: Davina Poke D.O.   On: 09/04/2021 14:13   DG Foot Complete Left  Result Date: 09/04/2021 CLINICAL DATA:  Left foot infection. EXAM: LEFT FOOT - COMPLETE 3+ VIEW COMPARISON:  November 17, 2013. FINDINGS: There is no evidence of fracture or dislocation. No lytic destruction is seen to suggest osteomyelitis. There is no evidence of arthropathy or other focal bone abnormality. Soft tissues are unremarkable. IMPRESSION: Negative. Electronically Signed   By: Marijo Conception M.D.   On: 09/04/2021 13:34   VAS [place] ABI WITH/WO TBI  Result Date: 09/05/2021  LOWER EXTREMITY DOPPLER STUDY Patient Name:  CYNDI MONTEJANO  Date of Exam:   09/05/2021 Medical Rec #: [id number]            Accession #:    [id number] Date of Birth: Oct 01, 1961            Patient Gender: F  Patient Age:   4 years Exam Location:  [facility] Procedure:      VAS [place] ABI WITH/WO TBI Referring Phys: Madalyn Rob --------------------------------------------------------------------------------  Indications: Ulceration. High Risk Factors: Hypertension, hyperlipidemia, Diabetes.  Limitations: Today's exam was limited due to patient positioning, an open wound,              bandages , involuntary patient movement and poor patient              cooperation. Performing Technologist: Carlos Levering RVT  Examination Guidelines: A complete evaluation includes at minimum, Doppler waveform signals and systolic blood pressure reading at the level of bilateral brachial, anterior tibial, and posterior tibial arteries, when vessel segments are accessible. Bilateral testing is considered an integral part of a complete examination. Photoelectric Plethysmograph (PPG) waveforms and toe systolic pressure readings are included as required and additional duplex testing as needed. Limited examinations for reoccurring indications may be performed as noted.  ABI Findings: +--------+------------------+-----+----------+--------+ Right   Rt Pressure (mmHg)IndexWaveform  Comment  +--------+------------------+-----+----------+--------+ [id number]                    triphasic          +--------+------------------+-----+----------+--------+ PTA     72                0.45 monophasic         +--------+------------------+-----+----------+--------+ DP      64                0.40 monophasic         +--------+------------------+-----+----------+--------+ +--------+------------------+-----+----------+-------+ Left    Lt Pressure (mmHg)IndexWaveform  Comment +--------+------------------+-----+----------+-------+ [id number]                    triphasic         +--------+------------------+-----+----------+-------+ PTA     74                0.46 monophasic         +--------+------------------+-----+----------+-------+ DP      81                0.50 monophasic        +--------+------------------+-----+----------+-------+ +-------+-----------+-----------+------------+------------+ ABI/TBIToday's ABIToday's TBIPrevious ABIPrevious TBI +-------+-----------+-----------+------------+------------+ Right  0.45                                           +-------+-----------+-----------+------------+------------+ Left   0.5                                            +-------+-----------+-----------+------------+------------+  Summary: Right: Resting right ankle-brachial index indicates moderate right lower extremity arterial disease. Unable to obtain TBI due to constant patient movement. Left: Resting left ankle-brachial index indicates moderate left lower extremity arterial disease. Unable to obtain TBI due to constant patient movement.  *See table(s) above for measurements and observations.  Electronically signed by [name] MD on 09/05/2021 at 6:54:53 PM.    Final      Signed: [name], MD Internal Medicine Resident, PGY-1 [name]  Pager: [phone] 11:51 AM, 09/21/2021

## 2021-09-21 NOTE — Progress Notes (Signed)
DC orders reviewed with pt and husband-all belongings gathered for pt to take home-dressing changes reviewed with husband and supplies given-pt was DC via wheelchair in stable condition

## 2021-09-21 NOTE — Progress Notes (Signed)
CSW and RN CM met with patient at bedside to have discussion regarding discharge plan. Patient confirms she resides with her husband and that her daughter resides in [place]. Patient reports her husband works 3pm-11pm Sunday through Thursday. Patient confirms she has a glucometer to check her glucose levels, a bedside commode, and a walker at home. Patient states she only requires dressing changes once per day. Patient is agreeable to discharge home with her husband instead of SNF. RN CM to secure home health agency and supplies prior to discharge.  MD and RN made aware of discharge plan.    , MSW, LCSW Transitions of Care  Clinical Social Worker II [phone]  

## 2021-09-21 NOTE — Progress Notes (Signed)
Physical Therapy Treatment Patient Details Name: Nancy [name] MRN: [id number] DOB: 04-18-1961 Today's Date: 09/21/2021   History of Present Illness 60 y.o. female admitted on 9/19 with necrotic L heel; MRI (+) for ulceration with possible cellulitis/myositis. Pt has refused MD recommendation of LLE BKA. PMH includes anemia, depression, DM, peripheral neuropathy, HLD, HTN, CVA, CHF, HOH, CKD.   PT Comments    Pt preparing for d/c home today or tomorrow. Today's session focused on transfer and gait training with RW, pt requiring up to modA to stand from low surface height. Noted pt currently staying at hotel with husband; increased time discussing activity recommendations and fall risk reduction. If pt to remain admitted, will continue to follow acutely.    Recommendations for follow up therapy are one component of a multi-disciplinary discharge planning process, led by the attending physician.  Recommendations may be updated based on patient status, additional functional criteria and insurance authorization.  Follow Up Recommendations  Home health PT;Supervision for mobility/OOB (SNF declined)     Equipment Recommendations  Wheelchair (measurements PT);Wheelchair cushion (measurements PT)    Recommendations for Other Services       Precautions / Restrictions Precautions Precautions: Fall;Other (comment) Precaution Comments: Urine incontinence Required Braces or Orthoses: Other Brace Other Brace: L foot PRAFO with ambulation Restrictions Other Position/Activity Restrictions: no WB orders but per notes "Offload" L LE     Mobility  Bed Mobility Overal bed mobility: Needs Assistance Bed Mobility: Supine to Sit     Supine to sit: Supervision;HOB elevated     General bed mobility comments: No physical assist required, increased time and effort    Transfers Overall transfer level: Needs assistance Equipment used: Rolling walker (2 wheeled) Transfers: Sit to/from  Stand Sit to Stand: Min assist;Mod assist         General transfer comment: Initial minA to stand from EOB to RW, cues for hand placement and use of momentum to power up; additional standing trial from low couch height (as pt reports she sits on couch in motel room), pt requiring modA for trunk elevation, additional cues for hand placement  Ambulation/Gait Ambulation/Gait assistance: Min guard Gait Distance (Feet): 24 Feet Assistive device: Rolling walker (2 wheeled) Gait Pattern/deviations: Step-to pattern;Decreased weight shift to left;Antalgic;Trunk flexed Gait velocity: Decreased   General Gait Details: Slow, mostly steady gait with RW and min guard for balance; noted L foot sliding in PRAFO but difficulty adjusting device to prevent this from happening   Stairs             Wheelchair Mobility    Modified Rankin (Stroke Patients Only)       Balance Overall balance assessment: Needs assistance Sitting-balance support: Feet supported;No upper extremity supported Sitting balance-Leahy Scale: Good     Standing balance support: During functional activity;No upper extremity supported;Bilateral upper extremity supported Standing balance-Leahy Scale: Fair Standing balance comment: Can static stand without UE support; static and dynamic stability improved with RW                            Cognition Arousal/Alertness: Awake/alert Behavior During Therapy: Flat affect Overall Cognitive Status: No family/caregiver present to determine baseline cognitive functioning Area of Impairment: Attention;Memory;Following commands;Safety/judgement;Awareness;Problem solving                   Current Attention Level: Sustained Memory: Decreased short-term memory Following Commands: Follows one step commands with increased time Safety/Judgement: Decreased awareness of safety;Decreased awareness of  deficits Awareness: Emergent Problem Solving: Slow  processing;Requires verbal cues General Comments: requires cues for safety      Exercises      General Comments General comments (skin integrity, edema, etc.): Pt preparing for d/c home today or tomorrow; pt staying at motel with husband, reports no stairs she has to manage; notes she sleeps in bed and sits on couch during day, therefore practiced standing from low couch height; pt has RW and 3in1, would likely be unable to safely get to/from [facility] without assist from husband (who works 3-11p), therefore educ on wearing Depends due to bladder/bowel incontinence      Pertinent Vitals/Pain Pain Assessment: Faces Faces Pain Scale: Hurts a little bit Pain Location: head Pain Descriptors / Indicators: Headache Pain Intervention(s): Monitored during session    Home Living                      Prior Function            PT Goals (current goals can now be found in the care plan section) Progress towards PT goals: Progressing toward goals    Frequency    Min 3X/week      PT Plan Discharge plan needs to be updated;Frequency needs to be updated    Co-evaluation              AM-PAC PT "6 Clicks" Mobility   Outcome Measure  Help needed turning from your back to your side while in a flat bed without using bedrails?: A Little Help needed moving from lying on your back to sitting on the side of a flat bed without using bedrails?: A Little Help needed moving to and from a bed to a chair (including a wheelchair)?: A Little Help needed standing up from a chair using your arms (e.g., wheelchair or bedside chair)?: A Lot Help needed to walk in hospital room?: A Little Help needed climbing 3-5 steps with a railing? : A Lot 6 Click Score: 16    End of Session Equipment Utilized During Treatment: Gait belt Activity Tolerance: Patient tolerated treatment well Patient left: in chair;with call bell/phone within reach;with chair alarm set Nurse Communication: Mobility status PT  Visit Diagnosis: Other abnormalities of gait and mobility (R26.89);Muscle weakness (generalized) (M62.81)     Time: 0973-5329 PT Time Calculation (min) (ACUTE ONLY): 22 min  Charges:  $Therapeutic Activity: 8-22 mins                     Mabeline Caras, PT, DPT Acute Rehabilitation Services  Pager [phone] Office [place] 09/21/2021, 2:28 PM

## 2021-09-22 ENCOUNTER — Telehealth: Payer: Self-pay

## 2021-09-22 NOTE — Telephone Encounter (Signed)
Transition Care Management Follow-up Telephone Call Date of discharge and from where: 09/21/2021  [facility]  How have you been since you were released from the hospital? She said she is doing okay Any questions or concerns? No   Items Reviewed: Did the pt receive and understand the discharge instructions provided? Yes  Medications obtained and verified? Yes  - she said she has all medications and did not have any questions about her med regime. Other? No  Any new allergies since your discharge? No  Do you have support at home? Yes , her husband   [place] and Equipment/Supplies: Were home health services ordered? no Stated she have a bedside commode and a cane if needed    Functional Questionnaire: (I = Independent and D = Dependent) ADLs: independent, has cane to use with ambulation.    Follow up appointments reviewed:   PCP Hospital f/u appt confirmed? Yes Dr Wynetta Emery on 10/06/2021  Specialist Hospital f/u appt confirmed? Yes   Are transportation arrangements needed? No  If their condition worsens, is the pt aware to call PCP or go to the Emergency Dept.? Yes Was the patient provided with contact information for the PCP's office or ED? Yes Was to pt encouraged to call back with questions or concerns? Yes

## 2021-09-22 NOTE — Progress Notes (Signed)
The patient's husband called from home during the daily dressing changes and he states that he is missing the butterfly Mepalix dressing for the heel and asked if the 5 N nursing unit could provide a few of the dressings until [facility] home health comes to the home.  I spoke with the charge RN on 5 N and Lennie Muckle, [facility] [place] and they are aware.  Charge RN left the appropriate dressings with the secretary on the unit for the husband to pick up today.  I called Dr. Posey Pronto and made him aware that the discharge instructions in the AVS need to be clarified for the home health agency and he is making a note in the discharge summary.  The patient's husband was given written discharge instructions when he was sent home by Cheri Guppy, RN yesterday and the husband was able to list the name of the missing dressing from the instructions.  I called the patient's husband back and he plans to pick up the supplies from the unit.

## 2021-09-22 NOTE — Progress Notes (Signed)
WOUND CARE DISCHARGE INSTRUCTION:  I will recommend at this time that both LE are to be cleansed thoroughly with soap and water, rinsed and pat dry. Apply a generous coat of Sween moisturizing lotion to the lower legs and to the feet except for the open wounds.  Continue the betadine application to the left heel and allow to air dry. Apply a small piece of Aquacel Advantage over the anterior aspect of both feet. Wrap bilateral LE from the tip of the toes to below the knees with Kerlix and then replace Prevalon boots. Sween moisturizing lotion will not allow foam dressings to stick. Change daily.  Monitor the wound area(s) for worsening of condition such as: Signs/symptoms of infection, increase in size, development of or worsening of odor, development of pain, or increased pain at the affected locations.   Notify the medical team if any of these develop

## 2021-09-26 ENCOUNTER — Encounter (HOSPITAL_COMMUNITY): Payer: Self-pay

## 2021-09-26 ENCOUNTER — Ambulatory Visit: Payer: Self-pay

## 2021-09-26 ENCOUNTER — Other Ambulatory Visit: Payer: Self-pay

## 2021-09-26 ENCOUNTER — Ambulatory Visit (HOSPITAL_COMMUNITY)
Admit: 2021-09-26 | Discharge: 2021-09-26 | Disposition: A | Discharge status: Home / Self Care | Payer: Medicaid Other | Source: Ambulatory Visit | Attending: Family Medicine | Admitting: Family Medicine

## 2021-09-26 ENCOUNTER — Other Ambulatory Visit (HOSPITAL_COMMUNITY): Payer: Self-pay

## 2021-09-26 VITALS — BP 144/89 | HR 80 | Wt 229.8 lb

## 2021-09-26 DIAGNOSIS — Z139 Encounter for screening, unspecified: Secondary | ICD-10-CM

## 2021-09-26 DIAGNOSIS — I5033 Acute on chronic diastolic (congestive) heart failure: Secondary | ICD-10-CM

## 2021-09-26 DIAGNOSIS — I10 Essential (primary) hypertension: Secondary | ICD-10-CM

## 2021-09-26 DIAGNOSIS — Z596 Low income: Secondary | ICD-10-CM | POA: Insufficient documentation

## 2021-09-26 DIAGNOSIS — Z794 Long term (current) use of insulin: Secondary | ICD-10-CM | POA: Diagnosis not present

## 2021-09-26 DIAGNOSIS — R0602 Shortness of breath: Secondary | ICD-10-CM | POA: Diagnosis present

## 2021-09-26 DIAGNOSIS — Z7901 Long term (current) use of anticoagulants: Secondary | ICD-10-CM | POA: Diagnosis not present

## 2021-09-26 DIAGNOSIS — N185 Chronic kidney disease, stage 5: Secondary | ICD-10-CM | POA: Diagnosis not present

## 2021-09-26 DIAGNOSIS — Z8249 Family history of ischemic heart disease and other diseases of the circulatory system: Secondary | ICD-10-CM | POA: Diagnosis not present

## 2021-09-26 DIAGNOSIS — I739 Peripheral vascular disease, unspecified: Secondary | ICD-10-CM

## 2021-09-26 DIAGNOSIS — R5381 Other malaise: Secondary | ICD-10-CM

## 2021-09-26 DIAGNOSIS — I639 Cerebral infarction, unspecified: Secondary | ICD-10-CM

## 2021-09-26 DIAGNOSIS — E1152 Type 2 diabetes mellitus with diabetic peripheral angiopathy with gangrene: Secondary | ICD-10-CM | POA: Insufficient documentation

## 2021-09-26 DIAGNOSIS — Z79899 Other long term (current) drug therapy: Secondary | ICD-10-CM | POA: Diagnosis not present

## 2021-09-26 DIAGNOSIS — E785 Hyperlipidemia, unspecified: Secondary | ICD-10-CM | POA: Diagnosis not present

## 2021-09-26 DIAGNOSIS — E1122 Type 2 diabetes mellitus with diabetic chronic kidney disease: Secondary | ICD-10-CM | POA: Insufficient documentation

## 2021-09-26 DIAGNOSIS — I2510 Atherosclerotic heart disease of native coronary artery without angina pectoris: Secondary | ICD-10-CM | POA: Diagnosis not present

## 2021-09-26 DIAGNOSIS — Z09 Encounter for follow-up examination after completed treatment for conditions other than malignant neoplasm: Secondary | ICD-10-CM | POA: Diagnosis not present

## 2021-09-26 DIAGNOSIS — R0789 Other chest pain: Secondary | ICD-10-CM | POA: Diagnosis not present

## 2021-09-26 DIAGNOSIS — Z87891 Personal history of nicotine dependence: Secondary | ICD-10-CM | POA: Insufficient documentation

## 2021-09-26 DIAGNOSIS — I272 Pulmonary hypertension, unspecified: Secondary | ICD-10-CM | POA: Diagnosis not present

## 2021-09-26 DIAGNOSIS — E1143 Type 2 diabetes mellitus with diabetic autonomic (poly)neuropathy: Secondary | ICD-10-CM | POA: Diagnosis not present

## 2021-09-26 DIAGNOSIS — Z5901 Sheltered homelessness: Secondary | ICD-10-CM | POA: Insufficient documentation

## 2021-09-26 DIAGNOSIS — E1142 Type 2 diabetes mellitus with diabetic polyneuropathy: Secondary | ICD-10-CM | POA: Insufficient documentation

## 2021-09-26 DIAGNOSIS — I132 Hypertensive heart and chronic kidney disease with heart failure and with stage 5 chronic kidney disease, or end stage renal disease: Secondary | ICD-10-CM | POA: Diagnosis not present

## 2021-09-26 DIAGNOSIS — I5022 Chronic systolic (congestive) heart failure: Secondary | ICD-10-CM

## 2021-09-26 DIAGNOSIS — L8990 Pressure ulcer of unspecified site, unspecified stage: Secondary | ICD-10-CM

## 2021-09-26 DIAGNOSIS — Z7982 Long term (current) use of aspirin: Secondary | ICD-10-CM | POA: Insufficient documentation

## 2021-09-26 DIAGNOSIS — I6523 Occlusion and stenosis of bilateral carotid arteries: Secondary | ICD-10-CM

## 2021-09-26 DIAGNOSIS — I96 Gangrene, not elsewhere classified: Secondary | ICD-10-CM

## 2021-09-26 DIAGNOSIS — E782 Mixed hyperlipidemia: Secondary | ICD-10-CM

## 2021-09-26 DIAGNOSIS — Z8673 Personal history of transient ischemic attack (TIA), and cerebral infarction without residual deficits: Secondary | ICD-10-CM | POA: Diagnosis not present

## 2021-09-26 DIAGNOSIS — R531 Weakness: Secondary | ICD-10-CM | POA: Insufficient documentation

## 2021-09-26 LAB — CBC
HCT: 34 % — ABNORMAL LOW (ref 36.0–46.0)
Hemoglobin: 10.3 g/dL — ABNORMAL LOW (ref 12.0–15.0)
MCH: 28.1 pg (ref 26.0–34.0)
MCHC: 30.3 g/dL (ref 30.0–36.0)
MCV: 92.6 fL (ref 80.0–100.0)
Platelets: 225 10*3/uL (ref 150–400)
RBC: 3.67 MIL/uL — ABNORMAL LOW (ref 3.87–5.11)
RDW: 15.8 % — ABNORMAL HIGH (ref 11.5–15.5)
WBC: 9.1 10*3/uL (ref 4.0–10.5)
nRBC: 0 % (ref 0.0–0.2)

## 2021-09-26 LAB — BASIC METABOLIC PANEL
Anion gap: 9 (ref 5–15)
BUN: 35 mg/dL — ABNORMAL HIGH (ref 8–23)
CO2: 27 mmol/L (ref 22–32)
Calcium: 8.9 mg/dL (ref 8.9–10.3)
Chloride: 101 mmol/L (ref 98–111)
Creatinine, Ser: 2.75 mg/dL — ABNORMAL HIGH (ref 0.44–1.00)
GFR, Estimated: 19 mL/min — ABNORMAL LOW (ref >60)
Glucose, Bld: 137 mg/dL — ABNORMAL HIGH (ref 70–99)
Potassium: 4.5 mmol/L (ref 3.5–5.1)
Sodium: 137 mmol/L (ref 135–145)

## 2021-09-26 LAB — BRAIN NATRIURETIC PEPTIDE: B Natriuretic Peptide: 548.5 pg/mL — ABNORMAL HIGH (ref 0.0–100.0)

## 2021-09-26 MED ORDER — CARVEDILOL 12.5 MG PO TABS
12.5000 mg | ORAL_TABLET | Freq: Two times a day (BID) | ORAL | 2 refills | Status: DC
  Filled 2021-09-26: qty 60, 30d supply, fill #0

## 2021-09-26 MED ORDER — TORSEMIDE 20 MG PO TABS
40.0000 mg | ORAL_TABLET | Freq: Every day | ORAL | 2 refills | Status: DC
  Filled 2021-09-26: qty 60, 30d supply, fill #0

## 2021-09-26 MED ORDER — ASPIRIN 81 MG PO TBEC
81.0000 mg | DELAYED_RELEASE_TABLET | Freq: Every day | ORAL | 1 refills | Status: AC
  Filled 2021-09-26: qty 30, 30d supply, fill #0

## 2021-09-26 MED ORDER — ATORVASTATIN CALCIUM 40 MG PO TABS
40.0000 mg | ORAL_TABLET | Freq: Every day | ORAL | 4 refills | Status: DC
  Filled 2021-09-26: qty 30, 30d supply, fill #0
  Filled 2021-11-03 (×2): qty 30, 30d supply, fill #1

## 2021-09-26 MED ORDER — AMLODIPINE BESYLATE 5 MG PO TABS
5.0000 mg | ORAL_TABLET | Freq: Every day | ORAL | 4 refills | Status: DC
  Filled 2021-09-26: qty 30, 30d supply, fill #0

## 2021-09-26 NOTE — Progress Notes (Signed)
Advanced Heart Failure Clinic Note   PCP: [name], MD PCP-Cardiologist: Quay Burow, MD  HF Cardiologist: Dr. Aundra Dubin  HPI:   Nancy [name] 60 y.o. AAF w/ chronic diastolic heart failure, Stage IV CKD (baseline SCr ~2-3), HTN, T2DM, HLD, CVA, PVD (carotid artery duplex 3/22: occluded Rt ICA, mod Lt ICA stenosis) and tobacco abuse.   Echo 11/2019: EF 55-60%, RV normal. No LVH. No significant valvular dysfunction   She was referred to Dr. Gwenlyn Found for [facility] by PCP in April 2022. Complained of progressive exertional dyspnea, LEE and orthopnea. Echo 4/22: EF 60-65%, GIIDD, mild LVH, RV normal. NST w/ findings suggestive of possible anterior ischemia vs breast attenuation. R/LHC on 04/27/21 showed mild nonobstructive CAD and RHC hemodynamics c/w pulmonary HTN (see cath data below). Diuretics were increased w/ plans to [facility]. Dry wt felt to be ~228 lb.   Unfortunately, she has had 3 consecutive re-admissions for a/c dCHF since that time.   Admitted 5/24-5/27=> discharged home on Lasix 40 daily.   Admitted 6/25-6/30 =>SCr bump to 3.2 (2.9 at d/c). Discharged home on Torsemide 40 daily. Echo EF 55-60%, RV normal.   Admitted 07/07/21= > direct admit from Dr. Kennon Holter office w/ marked fluid overload, progressive wt gain, exertional dyspnea, LEE and orthopnea. Clinic BP elevated 172/85. SCr 2.6 on admit. Started on lasix gtt. AHF team consulted to assist with further management. She underwent RHC, suggests ongoing volume overload with elevated PCWP and RA.  Prominent v-waves in PCWP but think due to severe diastolic dysfunction as there is no significant MR on review of recent echo.  PYP study was not suggestive of TTR cardiac amyloidosis.  Suspect severe diastolic dysfunction due to long-standing and poorly controlled HTN. SCr and potassium continued to climb and nephrology consulted. Her diuretics were adjusted and she was able to be discharged on low dose carvedilol and torsemide  40 daily; weight 226 lbs.  Post hospital follow up 08/04/21 she was volume overloaded in the setting of dietary indiscretion. Torsemide increased to 80 mg x 3 days and unna boots ordered.   Seen in ED 08/13/21 with leg swelling. Given IV lasix with good response. Home torsemide was doubled and she was discharged.  Admitted 09/04/21 with a left heel ulcer secondary to diabetic neuropathy.  Work-up included right ABI of 0.45 and local ABI of 0.5.  She was started on antibiotics, no osteomyelitis on MRI. Vascular consulted and did not pursue angiography as it could potentially push her into renal failure and require initiation of HD. Ortho recommended left transtibial amputation  but she declined. Palliative care met with her but she did not want to discuss her health issues. There was no indication for HD, per nephrology, but she will likely need AV fistula and HD soon. She is not interested in this. PT recommended SNF however patient declined. She was discharged with Home Health (she is Medicaid pending, no insurance).   Today she returns for post hospitalization HF follow up with her husband. They are now living in an extended-stay motel. She is more SOB and has atypical chest pain since last night.  She is NWB to left leg and says she is not in pain. She uses a cane to get around her house due to right-sided weakness from previous stroke. Denies dizziness, edema, or PND/Orthopnea. Appetite ok. No fever or chills. She does not weigh regularly at home but has a scale. Taking all medications. She has finished her antibiotics.  ECG (personally reviewed):  sinus rhythm   ReDs: 42%  ROS: All systems reviewed and negative except as per HPI.   Past Medical History:  Diagnosis Date   Anemia 2006   Depression 2014   previously on amitryptiline    Diabetes mellitus with neurological manifestation ([place]) 2006   Diabetic peripheral neuropathy ([place]) 04/26/2020   Fracture of left ankle 1997    Gastroparesis  07/2016   HOH (hard of hearing) 2004    Hyperlipidemia 2006   Hypertension 2006   IBS (irritable bowel syndrome) 2002    Leukopenia 2015    Macular degeneration 11/2019   Shingles 2009    Stroke [facility]) 2020   Thyroid nodule 2004   Current Outpatient Medications  Medication Sig Dispense Refill   acetaminophen (TYLENOL) 500 MG tablet Take 500 mg by mouth every 6 (six) hours as needed for moderate pain or headache.     amLODipine (NORVASC) 5 MG tablet Take 1 tablet (5 mg total) by mouth daily. 30 tablet 0   amoxicillin-clavulanate (AUGMENTIN) 500-125 MG tablet Take 1 tablet (500 mg total) by mouth 2 (two) times daily for 5 days. 10 tablet 0   aspirin EC 81 MG tablet Take 1 tablet (81 mg total) by mouth daily. 100 tablet 1   atorvastatin (LIPITOR) 40 MG tablet Take 1 tablet (40 mg total) by mouth daily. 30 tablet 1   Blood Glucose Monitoring Suppl (ONETOUCH VERIO) w/Device KIT Check blood sugar three times daily. E11.40 1 kit 0   Blood Glucose Monitoring Suppl (TRUE METRIX METER) w/Device KIT Check blood sugar three times daily. E11.40 1 kit 0   calcium acetate (PHOSLO) 667 MG capsule Take 1 capsule (667 mg total) by mouth 3 (three) times daily with meals. 90 capsule 0   carvedilol (COREG) 12.5 MG tablet Take 1 tablet (12.5 mg total) by mouth 2 (two) times daily with a meal. 60 tablet 0   doxycycline (VIBRA-TABS) 100 MG tablet Take 1 tablet (100 mg total) by mouth every 12 (twelve) hours for 5 days. 10 tablet 0   DULoxetine (CYMBALTA) 20 MG capsule TAKE 1 CAPSULE (20 MG TOTAL) BY MOUTH DAILY. 30 capsule 2   ferrous sulfate 325 (65 FE) MG tablet Take 1 tablet (325 mg total) by mouth daily with breakfast. 100 tablet 1   glucose blood test strip Use as instructedCheck blood sugar three times daily. E11.40 100 each 12   insulin glargine (LANTUS) 100 UNIT/ML Solostar Pen Inject 55 Units into the skin at bedtime. 15 mL 3   insulin lispro (HUMALOG KWIKPEN) 100 UNIT/ML KwikPen Inject 4 units before  breakfast and dinner. (Patient taking differently: Inject 4 Units into the skin every evening.) 15 mL 3   risperiDONE (RISPERDAL) 3 MG tablet TAKE 1 TABLET (3 MG TOTAL) BY MOUTH AT BEDTIME. 30 tablet 2   senna-docusate (SENOKOT-S) 8.6-50 MG tablet Take 2 tablets by mouth at bedtime. 60 tablet 0   torsemide (DEMADEX) 20 MG tablet Take 2 tablets (40 mg total) by mouth daily. 60 tablet 0   amoxicillin-clavulanate (AUGMENTIN) 250-125 MG tablet Take 1 tablet by mouth every 12 (twelve) hours for 5 days. (Patient not taking: Reported on 09/26/2021) 10 tablet 0   No current facility-administered medications for this encounter.   Allergies  Allergen Reactions   Elemental Sulfur Hives and Other (See Comments)    PATIENT STATED THIS, MORE THAN LIKELY, SHOULD HAVE BEEN LOGGED AS "SULFA"   Hydralazine Hcl Other (See Comments)    Hair loss   Hydrocodone Itching  and Other (See Comments)    Upset stomach   Metformin And Related Nausea And Vomiting and Other (See Comments)    Stomach pains, also   Other Nausea Only and Other (See Comments)    Lettuce- Does not digest this!!   Plaquenil [Hydroxychloroquine Sulfate] Hives   Shellfish-Derived Products Nausea Only and Other (See Comments)    Caused an upset stomach   Shrimp (Diagnostic) Nausea Only and Other (See Comments)    Upset stomach    Sulfa Antibiotics Hives   Social History   Socioeconomic History   Marital status: Legally Separated    Spouse name: [name]   Number of children: 1   Years of education: some colle   Highest education level: Not on file  Occupational History   Occupation: Employed FT as Landscape architect    Comment: [employer] , NA  Tobacco Use   Smoking status: Former    Packs/day: 0.25    Years: 0.50    Pack years: 0.13    Types: Cigarettes   Smokeless tobacco: Never   Tobacco comments:    quit yrs ago  Vaping Use   Vaping Use: Never used  Substance and Sexual Activity   Alcohol use: No   Drug use: No   Sexual  activity: Yes    Birth control/protection: None  Other Topics Concern   Not on file  Social History Narrative   02/18/20 lives alone     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    Social Determinants of Health   Financial Resource Strain: High Risk   Difficulty of Paying Living Expenses: Hard  Food Insecurity: Food Insecurity Present   Worried About Charity fundraiser in the Last Year: Sometimes true   Ran Out of Food in the Last Year: Sometimes true  Transportation Needs: No Transportation Needs   Lack of Transportation (Medical): No   Lack of Transportation (Non-Medical): No  Physical Activity: Not on file  Stress: Not on file  Social Connections: Not on file  Intimate Partner Violence: Not on file   Family History  Problem Relation Age of Onset   Hypertension Mother    Heart disease Mother    Diabetes Mother    Thyroid disease Mother    Congestive Heart Failure Mother    Breast cancer Maternal Grandmother    Colon cancer Maternal Grandfather    Heart attack Sister    Heart disease Brother    Hyperlipidemia Brother    Hypertension Brother    Diabetes Father    Breast cancer Maternal Aunt    BP (!) 144/89   Pulse 80   Wt 104.2 kg (229 lb 12.8 oz)   SpO2 99%   BMI 39.45 kg/m   Wt Readings from Last 3 Encounters:  09/26/21 104.2 kg (229 lb 12.8 oz)  09/14/21 105.2 kg (231 lb 14.8 oz)  08/04/21 106.1 kg (233 lb 12.8 oz)   PHYSICAL EXAM: General:  NAD. No resp difficulty, arrived in [facility], chronically-ill appearing. HEENT: Normal Neck: Supple. No JVD. Carotids 2+ bilat; no bruits. No lymphadenopathy or thryomegaly appreciated. Cor: PMI nondisplaced. Regular rate & rhythm. No rubs, gallops or murmurs. Lungs: Clear Abdomen: Obese, nontender, nondistended. No hepatosplenomegaly. No bruits or masses. Good bowel sounds. Extremities: No cyanosis, clubbing, rash, 2+ BLE edema; left ortho boot in place w/ foul-smelling drainage from left  foot. Neuro: Alert & oriented x 3, cranial nerves grossly intact. Moves all 4 extremities w/o difficulty. Affect pleasant.  R-sided weakness, mild dysarthria.  ASSESSMENT & PLAN: 1. Acute on chronic diastolic CHF: Echo in 9/29 showed EF 55-60%, moderate LVH, normal RV, dilated IVC.  R/LHC in 5/22 showed nonobstructive CAD, pulmonary hypertension .  Multiple admissions recently with CHF, complicated by CKD stage IV.  RHC on 7/29 suggested ongoing volume overload with elevated PCWP and RA.  Prominent v-waves in PCWP but think due to severe diastolic dysfunction as there is no significant MR on review of recent echo.  PYP study was not suggestive of TTR cardiac amyloidosis.  Suspect severe diastolic dysfunction due to long-standing and poorly controlled HTN.  NYHA III, although functional class difficult due to right hemiparesis and physical deconditioning. She is volume overloaded on exam, ReDs 42%. - Increase torsemide to 40 mg bid x 3 days then start 60 mg daily (previously on 40 mg daily). BMET/BNP today, repeat BMET in 2 weeks. - Discussed importance of daily weights, limiting fluid to less than 2L/day and limiting high-sodium foods. - Have had discussions regarding Cardiomems. Unfortunately, she is currently not a candidate as she has no insurance. Can revisit, she is Medicaid-pending. 2. CAD: LHC on 5/22, she has mild to moderate segmental proximal mid and distal LAD disease. Her circumflex and RCA do not have significant disease. - Suspect current CP is related to volume overload, diurese as above. - ECG without ischemia. - Continue ASA + statin. 3. HLD: On atorvastatin 40. LDL 99, HDL 52 (7/22). - Will repeat lipids at next visit. 4. HTN: Elevated. Diurese as above. Avoid hypotension CKD. - Continue carvedilol 12.5 mg bid. - Continue amlodipine 5 mg daily. 5. CKD stage V: Renal US with no evidence of obstruction. Will likely need AV fistula placement soon. She is followed by vascular, however  she tells me she does not want HD. - She has appointment with [facility] 10/12/21. 6. Carotid disease: Occluded R ICA, moderate L ICA - Continue ASA, statin. 7. PAD: Now with bilateral gangrenous pressure ulcers, L >R. Evaluated by vascular and orthopedic surgery during recent admission, recommend transtibial amputation however patient declined this, opting for non-surgical healing. Pt denies any pain in the extremities. Foul-smelling discharge to left foot today. She has home health RN doing weekly dressing changes. - Check CBC today. - I will arrange for a follow-up appointment with [facility]. - Continue aspirin + statin. - Prevalon boots to continue offloading heels. 8. Deconditioning: Home Health PT to come out today. She will most likely need a wheelchair. I have given her husband a signed application to obtain a handicap placard. - I will refer for outpatient Palliative care services. Her husband works Sunday-Thursday 3p-11p and they are living in a motel until he can get "housing figured out." She is very high risk for readmission, and with her refusal for amputation, options are limited. 9. SDOH: I will reach out to our social worker to see if we can assist with any additional support services for housing or assistance with insurance process. They are agreeable to Paramedicine referral.  - They were given # for [facility] transport for Adonica's medical appointments. - I will sent all of her heart medications to [place] under [facility].  Follow up with APP in 2 weeks to assess volume.  [place], FNP 09/26/21

## 2021-09-26 NOTE — Telephone Encounter (Signed)
[place] stated pt's BP high. Initial BP 240/150 at 1500. Leah repeated BP and was 180/111. Pt was c/o dizziness and headache that comes and goes. Pt denies missing any BP meds.  Pt is awake alert and talking. Feels "tired" and is febrile to 100.3.  Denies weakness, numbness or tingling to face, arms and legs. Pt has a gangrenous ulcer to left that may be amputated. Pt decided to wear a orthopedic boot. Pt is using a cane and will be transitioned to a walker.   Routing to office for provider to review.     Reason for Disposition  [4] Systolic BP  >= 098 OR Diastolic >= 119 AND [1] having NO cardiac or neurologic symptoms  Answer Assessment - Initial Assessment Questions 1. BLOOD PRESSURE: "What is the blood pressure?" "Did you take at least two measurements 5 minutes apart?"     240/150 2. ONSET: "When did you take your blood pressure?"     1500 3. HOW: "How did you obtain the blood pressure?" (e.g., visiting nurse, automatic home BP monitor)     Automatic and manual 4. HISTORY: "Do you have a history of high blood pressure?"     yes 5. MEDICATIONS: "Are you taking any medications for blood pressure?" "Have you missed any doses recently?"     No  6. OTHER SYMPTOMS: "Do you have any symptoms?" (e.g., headache, chest pain, blurred vision, difficulty breathing, weakness)    H/o dizziness and headache.- denied sx during call. 7. PREGNANCY: "Is there any chance you are pregnant?" "When was your last menstrual period?"     N/a  Protocols used: Blood Pressure - High-A-AH

## 2021-09-26 NOTE — Patient Instructions (Addendum)
[name] Address: [address] [place] [address]  EKG was done today   Labs were done today, if any labs come back abnormal the clinic will call you  Your physician recommends that you schedule a follow-up appointment in: 2 weeks and then in 3 months   You have a appointment with the Kidney doctor [facility] [phone]  October 27th ,2022 at 11:00 am   You have a appointment with [facility] [phone] December 2nd, 2022 at 9:00 am the clinic will call you if there is any cancellations  to get you an earlier appointment  [place] [phone] to help you with transportation to your doctor appointments  We are following up with Palliative care and will contact you or the clinic will contact you with follow up care information  Increase Torsemide to 40 mg twice daily for 3 days ONLY then go to 60 mg 3 tablets daily  At the [facility], you and your health needs are our priority. As part of our continuing mission to provide you with exceptional heart care, we have created designated Provider Care Teams. These Care Teams include your primary Cardiologist (physician) and Advanced Practice Providers (APPs- Physician Assistants and Nurse Practitioners) who all work together to provide you with the care you need, when you need it.   You may see any of the following providers on your designated Care Team at your next follow up: Dr Glori Bickers Dr Loralie Champagne Dr Patrice Paradise, NP Lyda Jester, [place] Ginnie Smart Audry Riles, PharmD   Please be sure to bring in all your medications bottles to every appointment.    If you have any questions or concerns before your next appointment please send [place] a message through [place] or call our office at [phone].    TO LEAVE A MESSAGE FOR THE NURSE SELECT OPTION 2, PLEASE LEAVE A MESSAGE INCLUDING: YOUR NAME DATE OF  BIRTH CALL BACK NUMBER REASON FOR CALL**this is important as we prioritize the call backs  YOU WILL RECEIVE A CALL BACK THE SAME DAY AS LONG AS YOU CALL BEFORE 4:00 PM

## 2021-09-26 NOTE — Progress Notes (Signed)
ReDS Vest / Clip - 09/26/21 1100       ReDS Vest / Clip   Station Marker B    Ruler Value 33    ReDS Value Range High volume overload    ReDS Actual Value 42

## 2021-09-27 ENCOUNTER — Telehealth: Payer: Self-pay | Admitting: *Deleted

## 2021-09-27 ENCOUNTER — Other Ambulatory Visit: Payer: Self-pay

## 2021-09-27 MED ORDER — AMOXICILLIN-POT CLAVULANATE 500-125 MG PO TABS
1.0000 | ORAL_TABLET | Freq: Two times a day (BID) | ORAL | 0 refills | Status: AC
  Filled 2021-09-27: qty 10, 5d supply, fill #0

## 2021-09-27 MED ORDER — AMLODIPINE BESYLATE 10 MG PO TABS
10.0000 mg | ORAL_TABLET | Freq: Every day | ORAL | 3 refills | Status: DC
  Filled 2021-09-27: qty 30, 30d supply, fill #0

## 2021-09-27 NOTE — Telephone Encounter (Signed)
Copied from [place] [phone] [phone]. Topic: General - Other >> Sep 22, 2021  2:35 PM Tessa Lerner A wrote: Reason for CRM: [name] shares that the patient has been discharged from [facility] on 09/21/21 and was recommended for palliative care services   [facility] would like approval to provide services from the patients PCP

## 2021-09-27 NOTE — Telephone Encounter (Signed)
PC placed to pt today after reviewing message from RN. Patient's blood pressure was elevated and she was complaining of dizziness and headache when she was seen by home health nurse.   Patient tells me that she had taken her blood pressure medicines already before the nurse came.  I recommend that she increase amlodipine from 5 mg daily to 10 mg daily.  This means she will take 2 of the 5 mg tablets a day.  She expressed understanding.  She states that her headache and dizziness have resolved. In regards to the fever, I inquired whether she was taking the doxycycline and Augmentin antibiotics.  Her husband was in the background and was able to confirm that she does have the doxycycline but does not have the Augmentin.  I told him that I will send that prescription to our pharmacy.  Advised patient that if she continues to run fever she needs to return to the emergency room.  She expressed understanding.

## 2021-09-27 NOTE — Telephone Encounter (Signed)
Will forward to provider  

## 2021-09-27 NOTE — Addendum Note (Signed)
Addended by: [name] B on: 09/27/2021 01:05 PM   Modules accepted: Orders

## 2021-09-28 ENCOUNTER — Telehealth: Payer: Self-pay

## 2021-09-28 ENCOUNTER — Other Ambulatory Visit: Payer: Self-pay

## 2021-09-28 ENCOUNTER — Telehealth (HOSPITAL_COMMUNITY): Payer: Self-pay | Admitting: Licensed Clinical Social Worker

## 2021-09-28 MED FILL — Risperidone Tab 3 MG: ORAL | 30 days supply | Qty: 30 | Fill #0 | Status: AC

## 2021-09-28 NOTE — Telephone Encounter (Signed)
Spoke with patient's husband Nancy [name] and scheduled an in-person Palliative Consult for 10/17/21 @ 1PM with Dr. Hollace Kinnier. Documentation will be noted in [place].   COVID screening was negative. No pets in home. Patient lives with husband.  Consent obtained; updated Outlook/Netsmart/Team List and Epic.   Family is aware they may be receiving a call from provider the day before or day of to confirm appointment.

## 2021-09-28 NOTE — Progress Notes (Signed)
Heart and Vascular Care Navigation  09/28/2021  DIKSHA TAGLIAFERRO 07/12/1961 [id number]  Reason for Referral: Paramedicine Referral for concerns with medication adherence/comprehension and general concerns with home medical management   Engaged with patient by telephone for initial visit for Heart and Vascular Care Coordination.                                                                                                   Paramedicine Initial Assessment:  Housing:  In what kind of housing do you live? House/apt/trailer/shelter? Currently living in an extended stay hotel- wanting to move to more permanent housing budget is $1000/month for rent  Do you rent/pay a mortgage/own? Pays rent  Do you live with anyone? spouse  Are you currently worried about losing your housing? No not at this time   Social:  What is your current marital status? married  Do you have family or friends who live locally? Sister and brother who are closest support after spouse  Income:  What is your current source of income? Spouse works full time and supports them both  How hard is it for you to pay for the basics like food housing, medical care, and utilities? Not very hard  Do you have outstanding medical bills? Yes- working with [facility] to get [employer]:  Are you currently insured? no  Do you have prescription coverage? No- works with [facility] to get medications and also reports working with our outpatient pharmacy ([facility]?)  If no insurance, have you applied for coverage (Medicaid, disability, marketplace etc)? Has application pending for medicaid with [employer].  Transportation:  Do you have transportation to your medical appointments? Spouse transports her to and from appts   Daily Health Needs:  How do you manage your medications at home? Takes them out of the bottle  Do you ever take your medications differently than prescribed? Not intentionally but admits to having trouble  keeping mediation regimen straight  Do you have issues affording your medications? Gets assistance with [facility]  Do you have any concerns with mobility at home? Yes is limited with mobility but states she can complete own ADLs  Do you use any assistive devices at home or have PCS at home? cane  Do you have a PCP? Yes- [name]  Do you have any trouble reading or writing? no  Are there any additional barriers you see to getting the care you need? no               HRT/VAS Care Coordination     Living arrangements for the past 2 months Hotel/Motel   Lives with: Spouse   Patient Current Insurance Coverage Medicaid Pending   Home Assistive Devices/Equipment Other (Comment)  Cane   DME Agency NA   [facility]   Current home services DME  RW and 3:1 at home       Social History:  SDOH Screenings   Alcohol Screen: Not on file  Depression (PHQ2-9): Low Risk    PHQ-2 Score: 0  Financial Resource Strain: Low Risk    Difficulty of Paying Living Expenses: Not very hard  Food Insecurity: No Food Insecurity   Worried About Charity fundraiser in the Last Year: Never true   Ran Out of Food in the Last Year: Never true  Housing: Low Risk    Last Housing Risk Score: 0  Physical Activity: Not on file  Social Connections: Not on file  Stress: Not on file  Tobacco Use: Medium Risk   Smoking Tobacco Use: Former   Smokeless Tobacco Use: Never  Transportation Needs: No Transportation Needs   Lack of Transportation (Medical): No   Lack of Transportation (Non-Medical): No    SDOH Interventions: Financial Resources:  Sales promotion account executive Interventions: Intervention Not Indicated   Food Insecurity:   None reported  Housing Insecurity:  Housing Interventions: Intervention Not Indicated  Transportation:   Transportation Interventions: Intervention Not Indicated   Follow-up plan:    CSW to send out  referral to paramedics for assignment  CSW emailing pt list of housing in their price range for them to look into.  Will continue to follow through community paramedicine program  Jorge Ny, [facility] Desk#: [phone] Cell#: [phone]

## 2021-09-29 NOTE — Telephone Encounter (Signed)
Will forward to provider  

## 2021-09-29 NOTE — Telephone Encounter (Signed)
Pt has been scheduled for Palliative Consult for 10/17/21 @ 1PM(documentation in chart)

## 2021-10-03 ENCOUNTER — Other Ambulatory Visit: Payer: Self-pay

## 2021-10-04 ENCOUNTER — Encounter (HOSPITAL_BASED_OUTPATIENT_CLINIC_OR_DEPARTMENT_OTHER): Payer: Medicaid Other | Attending: Internal Medicine | Admitting: Internal Medicine

## 2021-10-04 ENCOUNTER — Other Ambulatory Visit: Payer: Self-pay

## 2021-10-04 DIAGNOSIS — F0390 Unspecified dementia without behavioral disturbance: Secondary | ICD-10-CM | POA: Insufficient documentation

## 2021-10-04 DIAGNOSIS — Z882 Allergy status to sulfonamides status: Secondary | ICD-10-CM | POA: Insufficient documentation

## 2021-10-04 DIAGNOSIS — E1122 Type 2 diabetes mellitus with diabetic chronic kidney disease: Secondary | ICD-10-CM | POA: Insufficient documentation

## 2021-10-04 DIAGNOSIS — I132 Hypertensive heart and chronic kidney disease with heart failure and with stage 5 chronic kidney disease, or end stage renal disease: Secondary | ICD-10-CM | POA: Diagnosis not present

## 2021-10-04 DIAGNOSIS — E1152 Type 2 diabetes mellitus with diabetic peripheral angiopathy with gangrene: Secondary | ICD-10-CM | POA: Insufficient documentation

## 2021-10-04 DIAGNOSIS — E11621 Type 2 diabetes mellitus with foot ulcer: Secondary | ICD-10-CM | POA: Insufficient documentation

## 2021-10-04 DIAGNOSIS — I120 Hypertensive chronic kidney disease with stage 5 chronic kidney disease or end stage renal disease: Secondary | ICD-10-CM | POA: Diagnosis not present

## 2021-10-04 DIAGNOSIS — L97528 Non-pressure chronic ulcer of other part of left foot with other specified severity: Secondary | ICD-10-CM | POA: Diagnosis not present

## 2021-10-04 DIAGNOSIS — N184 Chronic kidney disease, stage 4 (severe): Secondary | ICD-10-CM | POA: Diagnosis not present

## 2021-10-04 DIAGNOSIS — Z9115 Patient's noncompliance with renal dialysis: Secondary | ICD-10-CM | POA: Diagnosis not present

## 2021-10-04 DIAGNOSIS — Z885 Allergy status to narcotic agent status: Secondary | ICD-10-CM | POA: Insufficient documentation

## 2021-10-04 DIAGNOSIS — L97428 Non-pressure chronic ulcer of left heel and midfoot with other specified severity: Secondary | ICD-10-CM | POA: Diagnosis not present

## 2021-10-04 DIAGNOSIS — Z992 Dependence on renal dialysis: Secondary | ICD-10-CM | POA: Insufficient documentation

## 2021-10-05 ENCOUNTER — Emergency Department (HOSPITAL_COMMUNITY): Payer: Medicaid Other

## 2021-10-05 ENCOUNTER — Other Ambulatory Visit: Payer: Self-pay

## 2021-10-05 ENCOUNTER — Inpatient Hospital Stay (HOSPITAL_COMMUNITY)
Admission: EM | Admit: 2021-10-05 | Discharge: 2021-10-10 | DRG: 291 | Disposition: A | Discharge status: Home / Self Care | Payer: Medicaid Other | Attending: Internal Medicine | Admitting: Internal Medicine

## 2021-10-05 DIAGNOSIS — Z79899 Other long term (current) drug therapy: Secondary | ICD-10-CM

## 2021-10-05 DIAGNOSIS — I5033 Acute on chronic diastolic (congestive) heart failure: Secondary | ICD-10-CM

## 2021-10-05 DIAGNOSIS — Z885 Allergy status to narcotic agent status: Secondary | ICD-10-CM

## 2021-10-05 DIAGNOSIS — I2510 Atherosclerotic heart disease of native coronary artery without angina pectoris: Secondary | ICD-10-CM | POA: Diagnosis present

## 2021-10-05 DIAGNOSIS — Z882 Allergy status to sulfonamides status: Secondary | ICD-10-CM

## 2021-10-05 DIAGNOSIS — E782 Mixed hyperlipidemia: Secondary | ICD-10-CM | POA: Diagnosis present

## 2021-10-05 DIAGNOSIS — N184 Chronic kidney disease, stage 4 (severe): Secondary | ICD-10-CM | POA: Diagnosis present

## 2021-10-05 DIAGNOSIS — R0902 Hypoxemia: Secondary | ICD-10-CM | POA: Diagnosis present

## 2021-10-05 DIAGNOSIS — I272 Pulmonary hypertension, unspecified: Secondary | ICD-10-CM | POA: Diagnosis present

## 2021-10-05 DIAGNOSIS — Z794 Long term (current) use of insulin: Secondary | ICD-10-CM

## 2021-10-05 DIAGNOSIS — R0603 Acute respiratory distress: Secondary | ICD-10-CM

## 2021-10-05 DIAGNOSIS — L97509 Non-pressure chronic ulcer of other part of unspecified foot with unspecified severity: Secondary | ICD-10-CM | POA: Diagnosis present

## 2021-10-05 DIAGNOSIS — Z8249 Family history of ischemic heart disease and other diseases of the circulatory system: Secondary | ICD-10-CM

## 2021-10-05 DIAGNOSIS — E1143 Type 2 diabetes mellitus with diabetic autonomic (poly)neuropathy: Secondary | ICD-10-CM | POA: Diagnosis present

## 2021-10-05 DIAGNOSIS — L89629 Pressure ulcer of left heel, unspecified stage: Secondary | ICD-10-CM | POA: Diagnosis present

## 2021-10-05 DIAGNOSIS — E1122 Type 2 diabetes mellitus with diabetic chronic kidney disease: Secondary | ICD-10-CM | POA: Diagnosis present

## 2021-10-05 DIAGNOSIS — I5032 Chronic diastolic (congestive) heart failure: Secondary | ICD-10-CM | POA: Diagnosis present

## 2021-10-05 DIAGNOSIS — Z20822 Contact with and (suspected) exposure to covid-19: Secondary | ICD-10-CM | POA: Diagnosis present

## 2021-10-05 DIAGNOSIS — E1142 Type 2 diabetes mellitus with diabetic polyneuropathy: Secondary | ICD-10-CM | POA: Diagnosis present

## 2021-10-05 DIAGNOSIS — E119 Type 2 diabetes mellitus without complications: Secondary | ICD-10-CM

## 2021-10-05 DIAGNOSIS — L89619 Pressure ulcer of right heel, unspecified stage: Secondary | ICD-10-CM | POA: Diagnosis present

## 2021-10-05 DIAGNOSIS — Z8673 Personal history of transient ischemic attack (TIA), and cerebral infarction without residual deficits: Secondary | ICD-10-CM

## 2021-10-05 DIAGNOSIS — Z9114 Patient's other noncompliance with medication regimen: Secondary | ICD-10-CM

## 2021-10-05 DIAGNOSIS — Z90710 Acquired absence of both cervix and uterus: Secondary | ICD-10-CM

## 2021-10-05 DIAGNOSIS — Z833 Family history of diabetes mellitus: Secondary | ICD-10-CM

## 2021-10-05 DIAGNOSIS — I130 Hypertensive heart and chronic kidney disease with heart failure and stage 1 through stage 4 chronic kidney disease, or unspecified chronic kidney disease: Principal | ICD-10-CM | POA: Diagnosis present

## 2021-10-05 DIAGNOSIS — L97529 Non-pressure chronic ulcer of other part of left foot with unspecified severity: Secondary | ICD-10-CM | POA: Diagnosis present

## 2021-10-05 DIAGNOSIS — Z888 Allergy status to other drugs, medicaments and biological substances status: Secondary | ICD-10-CM

## 2021-10-05 DIAGNOSIS — I501 Left ventricular failure: Secondary | ICD-10-CM | POA: Diagnosis present

## 2021-10-05 DIAGNOSIS — I509 Heart failure, unspecified: Secondary | ICD-10-CM

## 2021-10-05 DIAGNOSIS — Z87891 Personal history of nicotine dependence: Secondary | ICD-10-CM

## 2021-10-05 DIAGNOSIS — E1169 Type 2 diabetes mellitus with other specified complication: Secondary | ICD-10-CM | POA: Diagnosis present

## 2021-10-05 DIAGNOSIS — Z91013 Allergy to seafood: Secondary | ICD-10-CM

## 2021-10-05 DIAGNOSIS — K3184 Gastroparesis: Secondary | ICD-10-CM | POA: Diagnosis present

## 2021-10-05 DIAGNOSIS — E11621 Type 2 diabetes mellitus with foot ulcer: Secondary | ICD-10-CM | POA: Diagnosis present

## 2021-10-05 DIAGNOSIS — Z83438 Family history of other disorder of lipoprotein metabolism and other lipidemia: Secondary | ICD-10-CM

## 2021-10-05 DIAGNOSIS — I160 Hypertensive urgency: Secondary | ICD-10-CM | POA: Diagnosis present

## 2021-10-05 DIAGNOSIS — E1149 Type 2 diabetes mellitus with other diabetic neurological complication: Secondary | ICD-10-CM | POA: Diagnosis present

## 2021-10-05 LAB — I-STAT VENOUS BLOOD GAS, ED
Acid-Base Excess: 0 mmol/L (ref 0.0–2.0)
Bicarbonate: 25.8 mmol/L (ref 20.0–28.0)
Calcium, Ion: 1.12 mmol/L — ABNORMAL LOW (ref 1.15–1.40)
HCT: 32 % — ABNORMAL LOW (ref 36.0–46.0)
Hemoglobin: 10.9 g/dL — ABNORMAL LOW (ref 12.0–15.0)
O2 Saturation: 85 %
Potassium: 4.3 mmol/L (ref 3.5–5.1)
Sodium: 138 mmol/L (ref 135–145)
TCO2: 27 mmol/L (ref 22–32)
pCO2, Ven: 48.4 mmHg (ref 44.0–60.0)
pH, Ven: 7.335 (ref 7.250–7.430)
pO2, Ven: 54 mmHg — ABNORMAL HIGH (ref 32.0–45.0)

## 2021-10-05 LAB — CBC WITH DIFFERENTIAL/PLATELET
Abs Immature Granulocytes: 0.05 10*3/uL (ref 0.00–0.07)
Basophils Absolute: 0 10*3/uL (ref 0.0–0.1)
Basophils Relative: 0 %
Eosinophils Absolute: 0.3 10*3/uL (ref 0.0–0.5)
Eosinophils Relative: 2 %
HCT: 33.7 % — ABNORMAL LOW (ref 36.0–46.0)
Hemoglobin: 10 g/dL — ABNORMAL LOW (ref 12.0–15.0)
Immature Granulocytes: 0 %
Lymphocytes Relative: 12 %
Lymphs Abs: 1.4 10*3/uL (ref 0.7–4.0)
MCH: 27.8 pg (ref 26.0–34.0)
MCHC: 29.7 g/dL — ABNORMAL LOW (ref 30.0–36.0)
MCV: 93.6 fL (ref 80.0–100.0)
Monocytes Absolute: 0.8 10*3/uL (ref 0.1–1.0)
Monocytes Relative: 7 %
Neutro Abs: 9.1 10*3/uL — ABNORMAL HIGH (ref 1.7–7.7)
Neutrophils Relative %: 79 %
Platelets: 299 10*3/uL (ref 150–400)
RBC: 3.6 MIL/uL — ABNORMAL LOW (ref 3.87–5.11)
RDW: 14.8 % (ref 11.5–15.5)
WBC: 11.7 10*3/uL — ABNORMAL HIGH (ref 4.0–10.5)
nRBC: 0 % (ref 0.0–0.2)

## 2021-10-05 NOTE — ED Triage Notes (Signed)
PT BIB EMS from home. Pt has been SOB since yesterday. Pt went to the doctor for a wound check on her foot and was told she was full of fluid per EMS but sent home. Pt reports being SOB all day, when husband found pt she could not talk in complete sentences. EMS found pt to be 70% on RA, 36 RR, diminished in all lobes. EMS started on NRB and pt improved to 93% - EMS started pt on CPAP and pt improved to 100% and rales in all fields.  VS with EMS  230/120 HR 100 RR 26 CBG 232

## 2021-10-05 NOTE — ED Provider Notes (Signed)
[facility] EMERGENCY DEPARTMENT Provider Note   CSN: [id number] Arrival date & time: 10/05/21  2211     History Chief Complaint  Patient presents with   Respiratory Distress    Nancy [name] is a 60 y.o. female with history of CHF who present in respiratory distress.  Per patient she had shortness of breath x2 days and was seen at her doctor yesterday for wound check of her left heel.  At the time she was informed that she had evidence of fluid in her lungs on physical exam but was sent home.  Patient's husband called EMS when he was called by the patient on the phone and she was unable to complete sentences due to her shortness of breath.  EMS found patient to be hypoxic to the 70s on room air and tachypneic with rales in all lobes.  She started on nonrebreather with improvement in her sats to 38 and ultimately placed on CPAP in route with improvement in her sats to 100%.  LEVEL 5 CAVEAT due to patient's acuity on presentation.   I personally reviewed this patient's medical records.  Has history of diabetes, hypertension, hyperlipidemia, CVA, and CHF.  She takes torsemide 40 mg twice daily.  She is not anticoagulated.  HPI     Past Medical History:  Diagnosis Date   Anemia 2006   Depression 2014   previously on amitryptiline    Diabetes mellitus with neurological manifestation ([place]) 2006   Diabetic peripheral neuropathy ([place]) 04/26/2020   Fracture of left ankle 1997    Gastroparesis 07/2016   HOH (hard of hearing) 2004    Hyperlipidemia 2006   Hypertension 2006   IBS (irritable bowel syndrome) 2002    Leukopenia 2015    Macular degeneration 11/2019   Shingles 2009    Stroke [facility]) 2020   Thyroid nodule 2004    Patient Active Problem List   Diagnosis Date Noted   Acute cardiogenic pulmonary edema ([place]) 10/06/2021   Goals of care, counseling/discussion    Gangrene of left foot ([place])    Cellulitis of left foot    Diabetic foot ulcer ([place])  09/04/2021   CHF (congestive heart failure) ([place]) 07/07/2021   Normocytic anemia 06/30/2021   CKD (chronic kidney disease), stage IV ([place]) 06/11/9484   Acute diastolic CHF (congestive heart failure) ([place]) 06/10/2021   Abnormal nuclear stress test    Dyspnea on exertion 03/07/2021   Orthopnea 02/25/2021   History of cerebrovascular accident (CVA) with residual deficit 05/06/2020   Diabetic peripheral neuropathy ([place]) 04/26/2020   AKI (acute kidney injury) ([place]) 04/20/2020   Generalized weakness 04/20/2020   Dehydration 04/20/2020   Generalized abdominal pain    Pancreatitis, acute 02/29/2020   Cerebrovascular accident (CVA) ([place]) 11/08/2019   Stenosis of right carotid artery 11/08/2019   Hyperlipidemia associated with type 2 diabetes mellitus ([place]) 11/08/2019   Cortical age-related cataract of both eyes 08/30/2019   Gait abnormality 08/30/2019   Statin declined 08/30/2019   Gastroesophageal reflux disease without esophagitis 04/18/2018   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   Uncontrolled type 2 diabetes mellitus with diabetic neuropathy, with long-term current use of insulin 10/29/2017   History of macular degeneration 10/29/2017   Chronic cervical pain 10/29/2016   Myalgia 09/14/2016   Insomnia 09/14/2016   Tinea pedis of both feet 08/20/2016   Macular degeneration 08/17/2016   Low back pain 09/21/2014   Hypertension 09/21/2014   Diabetic gastroparesis associated with type 2 diabetes mellitus ([place]) 09/14/2014   Thyroid  nodule 08/17/2014   Obesity (BMI 30-39.9) 08/17/2014   Hyperlipidemia 08/05/2014   Nausea & vomiting 08/04/2014   Type II diabetes mellitus with neurological manifestations, uncontrolled 08/04/2014    Past Surgical History:  Procedure Laterality Date   ABDOMINAL HYSTERECTOMY  2005   CATARACT EXTRACTION Left 11/2019   CESAREAN SECTION  1983    CHOLECYSTECTOMY N/A 03/05/2020   Procedure: LAPAROSCOPIC CHOLECYSTECTOMY WITH INTRAOPERATIVE CHOLANGIOGRAM;  Surgeon: Donnie Mesa, MD;  Location: [facility];  Service: General;  Laterality: N/A;   ESOPHAGOGASTRODUODENOSCOPY (EGD) WITH PROPOFOL Left 08/26/2014   Procedure: ESOPHAGOGASTRODUODENOSCOPY (EGD) WITH PROPOFOL;  Surgeon: Arta Silence, MD;  Location: [facility] END[facility];  Service: Endoscopy;  Laterality: Left;   ESOPHAGOGASTRODUODENOSCOPY (EGD) WITH PROPOFOL N/A 03/03/2020   Procedure: ESOPHAGOGASTRODUODENOSCOPY (EGD) WITH PROPOFOL;  Surgeon: Jerene Bears, MD;  Location: [facility];  Service: Gastroenterology;  Laterality: N/A;   RIGHT HEART CATH N/A 07/14/2021   Procedure: RIGHT HEART CATH;  Surgeon: Larey Dresser, MD;  Location: [place] [facility];  Service: Cardiovascular;  Laterality: N/A;   RIGHT/LEFT HEART CATH AND CORONARY ANGIOGRAPHY N/A 04/27/2021   Procedure: RIGHT/LEFT HEART CATH AND CORONARY ANGIOGRAPHY;  Surgeon: [name], MD;  Location: [place] [facility];  Service: Cardiovascular;  Laterality: N/A;     OB History   No obstetric history on file.     Family History  Problem Relation Age of Onset   Hypertension Mother    Heart disease Mother    Diabetes Mother    Thyroid disease Mother    Congestive Heart Failure Mother    Breast cancer Maternal Grandmother    Colon cancer Maternal Grandfather    Heart attack Sister    Heart disease Brother    Hyperlipidemia Brother    Hypertension Brother    Diabetes Father    Breast cancer Maternal Aunt     Social History   Tobacco Use   Smoking status: Former    Packs/day: 0.25    Years: 0.50    Pack years: 0.13    Types: Cigarettes   Smokeless tobacco: Never   Tobacco comments:    quit yrs ago  Vaping Use   Vaping Use: Never used  Substance Use Topics   Alcohol use: No   Drug use: No    Home Medications Prior to Admission medications   Medication Sig Start Date End Date Taking? Authorizing Provider  acetaminophen (TYLENOL) 500 MG tablet Take 500 mg by mouth every 6 (six) hours as needed for moderate pain or headache.     [provider]  amLODipine (NORVASC) 10 MG tablet Take 1 tablet (10 mg total) by mouth daily. 09/27/21   [name], MD  aspirin 81 MG EC tablet Take 1 tablet (81 mg total) by mouth daily. 09/26/21   Milford, Maricela Bo, FNP  atorvastatin (LIPITOR) 40 MG tablet Take 1 tablet (40 mg total) by mouth daily. 09/26/21   [place], Maricela Bo, FNP  Blood Glucose Monitoring Suppl (ONETOUCH VERIO) w/Device KIT Check blood sugar three times daily. E11.40 05/06/20   [name], MD  Blood Glucose Monitoring Suppl (TRUE METRIX METER) w/Device KIT Check blood sugar three times daily. E11.40 07/11/20   [name], MD  calcium acetate (PHOSLO) 667 MG capsule Take 1 capsule (667 mg total) by mouth 3 (three) times daily with meals. 09/09/21 10/09/21  Rick Duff, MD  carvedilol (COREG) 12.5 MG tablet Take 1 tablet (12.5 mg total) by mouth 2 (two) times daily with a  meal. 09/26/21   Milford, Maricela Bo, FNP  DULoxetine (CYMBALTA) 20 MG capsule TAKE 1 CAPSULE (20 MG TOTAL) BY MOUTH DAILY. 02/06/21 02/06/22  Salley Slaughter, NP  ferrous sulfate 325 (65 FE) MG tablet Take 1 tablet (325 mg total) by mouth daily with breakfast. 08/01/21   [name], MD  glucose blood test strip Use as instructedCheck blood sugar three times daily. E11.40 07/11/20   [name], MD  insulin glargine (LANTUS) 100 UNIT/ML Solostar Pen Inject 55 Units into the skin at bedtime. 06/30/21   [name], MD  insulin lispro (HUMALOG KWIKPEN) 100 UNIT/ML KwikPen Inject 4 units before breakfast and dinner. Patient taking differently: Inject 4 Units into the skin every evening. 07/05/20   Camillia Herter, NP  risperiDONE (RISPERDAL) 3 MG tablet TAKE 1 TABLET (3 MG TOTAL) BY MOUTH AT BEDTIME. 02/06/21 02/06/22  Eulis Canner E, NP  senna-docusate (SENOKOT-S) 8.6-50 MG tablet Take 2 tablets by mouth at bedtime. 09/21/21 10/21/21  [name], MD  torsemide (DEMADEX) 20 MG tablet Take 2 tablets (40 mg  total) by mouth daily. 09/26/21   [place], Maricela Bo, FNP  Insulin NPH Isophane & Regular (RELION 70/30 [place]) Inject 35 Units into the skin 2 (two) times daily.  08/17/14  [provider]    Allergies    Elemental sulfur, Hydralazine hcl, Hydrocodone, Metformin and related, Other, Plaquenil [hydroxychloroquine sulfate], Shellfish-derived products, Shrimp (diagnostic), and Sulfa antibiotics  Review of Systems   Review of Systems  Constitutional:  Positive for fatigue. Negative for activity change, appetite change, chills, diaphoresis and fever.  HENT: Negative.    Eyes: Negative.   Respiratory:  Positive for shortness of breath.   Cardiovascular:  Positive for leg swelling.  Gastrointestinal:  Positive for abdominal distention. Negative for abdominal pain, constipation, diarrhea, nausea, rectal pain and vomiting.  Genitourinary: Negative.   Musculoskeletal: Negative.   Skin: Negative.   Neurological:  Negative for dizziness, facial asymmetry, weakness, light-headedness and headaches.   Physical Exam Updated Vital Signs BP (!) 195/79   Pulse 86   Temp 98.1 F (36.7 C) (Axillary)   Resp 20   SpO2 100%   Physical Exam Vitals and nursing note reviewed.  Constitutional:      Appearance: She is obese. She is not ill-appearing or toxic-appearing.  HENT:     Head: Normocephalic and atraumatic.     Nose: Nose normal. No congestion.     Mouth/Throat:     Mouth: Mucous membranes are moist.     Pharynx: Oropharynx is clear. Uvula midline. No oropharyngeal exudate or posterior oropharyngeal erythema.     Tonsils: No tonsillar exudate.  Eyes:     General: Lids are normal. Vision grossly intact.        Right eye: No discharge.        Left eye: No discharge.     Extraocular Movements: Extraocular movements intact.     Conjunctiva/sclera: Conjunctivae normal.     Pupils: Pupils are equal, round, and reactive to light.  Neck:     Trachea: Trachea and phonation normal.   Cardiovascular:     Rate and Rhythm: Normal rate and regular rhythm.     Pulses:          Dorsalis pedis pulses are 1+ on the right side and 1+ on the left side.     Heart sounds: Normal heart sounds. No murmur heard. Pulmonary:     Effort: Pulmonary effort is normal. No tachypnea, bradypnea, accessory  muscle usage, prolonged expiration or respiratory distress.     Breath sounds: Examination of the right-middle field reveals rales. Examination of the left-middle field reveals rales. Examination of the right-lower field reveals rales. Examination of the left-lower field reveals rales. Rales present. No wheezing.     Comments: On CPAP Chest:     Chest wall: No mass, lacerations, deformity, swelling, tenderness, crepitus or edema.  Abdominal:     General: Bowel sounds are normal. There is no distension.     Palpations: Abdomen is soft.     Tenderness: There is no abdominal tenderness. There is no right CVA tenderness, left CVA tenderness, guarding or rebound.  Musculoskeletal:        General: No deformity.     Cervical back: Normal range of motion and neck supple. No edema, rigidity, tenderness or crepitus. No pain with movement, spinous process tenderness or muscular tenderness.     Right lower leg: 3+ Pitting Edema present.     Left lower leg: 3+ Pitting Edema present.  Lymphadenopathy:     Cervical: No cervical adenopathy.  Skin:    General: Skin is warm and dry.     Capillary Refill: Capillary refill takes less than 2 seconds.  Neurological:     General: No focal deficit present.     Mental Status: She is alert and oriented to person, place, and time. Mental status is at baseline.  Psychiatric:        Mood and Affect: Mood normal.    ED Results / Procedures / Treatments   Labs (all labs ordered are listed, but only abnormal results are displayed) Labs Reviewed  COMPREHENSIVE METABOLIC PANEL - Abnormal; Notable for the following components:      Result Value   Glucose, Bld 196  (*)    BUN 25 (*)    Creatinine, Ser 2.62 (*)    Calcium 8.8 (*)    Albumin 2.8 (*)    AST 11 (*)    GFR, Estimated 20 (*)    All other components within normal limits  CBC WITH DIFFERENTIAL/PLATELET - Abnormal; Notable for the following components:   WBC 11.7 (*)    RBC 3.60 (*)    Hemoglobin 10.0 (*)    HCT 33.7 (*)    MCHC 29.7 (*)    Neutro Abs 9.1 (*)    All other components within normal limits  I-STAT VENOUS BLOOD GAS, ED - Abnormal; Notable for the following components:   pO2, Ven 54.0 (*)    Calcium, Ion 1.12 (*)    HCT 32.0 (*)    Hemoglobin 10.9 (*)    All other components within normal limits  RESP PANEL BY RT-PCR (FLU A&B, COVID) ARPGX2  BRAIN NATRIURETIC PEPTIDE  URINALYSIS, ROUTINE W REFLEX MICROSCOPIC  TROPONIN I (HIGH SENSITIVITY)  TROPONIN I (HIGH SENSITIVITY)    EKG EKG is nonischemic with sinus rhythm without ischemic changes.  Radiology DG Chest Portable 1 View  Result Date: 10/05/2021 CLINICAL DATA:  Shortness of breath EXAM: PORTABLE CHEST 1 VIEW COMPARISON:  08/12/2021 FINDINGS: Cardiac shadow is mildly prominent but stable. Vascular congestion is seen bilaterally without focal edema. Small left pleural effusion is noted. IMPRESSION: Changes of vascular congestion without significant edema. Small left pleural effusion. Electronically Signed   By: Inez Catalina M.D.   On: 10/05/2021 23:22    Procedures .Critical Care Performed by: Emeline Darling, PA-C Authorized by: Emeline Darling, PA-C   Critical care provider statement:    Critical care  time (minutes):  45   Critical care was necessary to treat or prevent imminent or life-threatening deterioration of the following conditions:  Respiratory failure   Critical care was time spent personally by me on the following activities:  Development of treatment plan with patient or surrogate, discussions with consultants, evaluation of patient's response to treatment, examination of patient,  obtaining history from patient or surrogate, ordering and performing treatments and interventions, ordering and review of laboratory studies, ordering and review of radiographic studies, pulse oximetry and re-evaluation of patient's condition   Medications Ordered in ED Medications  furosemide (LASIX) injection 60 mg (has no administration in time range)    ED Course  I have reviewed the triage vital signs and the nursing notes.  Pertinent labs & imaging results that were available during my care of the patient were reviewed by me and considered in my medical decision making (see chart for details).  Clinical Course as of 10/06/21 0105  Fri Oct 06, 2021  0103 Consult to hospitalist, Dr. Marlyce Huge, was agreeable to seeing the patient and admitting her to his service.  He does recommend increasing Lasix order to 60 from 40 mg that was initially ordered.  This has been changed.  We will continue to monitor her and try to wean her from CPAP to nonrebreather.  Feel this is reasonable at this time.  I appreciate his collaboration in the care of this patient. [RS]    Clinical Course User Index [RS] Tennyson Wacha, Sharlene Dory   MDM Rules/Calculators/A&P                         60 year old female presents with concern for shortness of breath.  Differential diagnosis includes presented to CHF exacerbation, ACS, PE, pleural effusion, dysrhythmia, pneumonia, sepsis.  Hypertensive and tachypneic on intake.  Satting 100% on BiPAP.  Cardiac exam is unremarkable, pulmonary exam with rales throughout the lung fields bilaterally.  Abdominal exam with mild distention per patient but soft, no fluid wave.  Bilateral lower extremity edema.  Patient clinically appears fluid overloaded.  There is also a boot on the left lower extremity reportedly for patient's wound on her heel.  CBC with mild leukocytosis of 11.7 , Hemoglobin of 10 near patient's baseline.  CMP with creatinine of 2.6 at patient's baseline.   I-STAT without acidosis or acid-base deficit.  Respiratory pathogen panel is negative.  Troponin is negative, 12.  EKG is nonischemic with sinus rhythm without ischemic changes.  Patient reports she is comfortable on CPAP now, she is no longer tachypneic with support of CPAP.  Do feel patient would benefit from admission to the hospital.  Chest x-ray revealed vascular congestion with pleural effusion on the left.  On my interpretation of the chest x-ray does appear to be consistent with pulmonary edema.  Lasix ordered.  I do feel patient would benefit from mission to the hospital due to respiratory distress upon presentation.  Amily voiced understanding of her medical evaluation and treatment plan.  Each of her questions was answered to her expressed satisfaction.  She is amenable to plan for admission at this time  Consult to hospitalist as above.  Patient admitted to his service.  This chart was dictated using voice recognition software, Dragon. Despite the best efforts of this provider to proofread and correct errors, errors may still occur which can change documentation meaning.   Final Clinical Impression(s) / ED Diagnoses Final diagnoses:  Respiratory distress    Rx /  DC Orders ED Discharge Orders     None        Aura Dials 10/06/21 0105    Fredia Sorrow, MD 10/07/21 [phone]

## 2021-10-06 ENCOUNTER — Inpatient Hospital Stay: Payer: Self-pay | Admitting: Internal Medicine

## 2021-10-06 ENCOUNTER — Encounter (HOSPITAL_COMMUNITY): Payer: Self-pay | Admitting: Internal Medicine

## 2021-10-06 ENCOUNTER — Other Ambulatory Visit: Payer: Self-pay

## 2021-10-06 ENCOUNTER — Inpatient Hospital Stay (HOSPITAL_COMMUNITY): Payer: Medicaid Other

## 2021-10-06 DIAGNOSIS — I160 Hypertensive urgency: Secondary | ICD-10-CM

## 2021-10-06 DIAGNOSIS — Z9114 Patient's other noncompliance with medication regimen: Secondary | ICD-10-CM | POA: Diagnosis not present

## 2021-10-06 DIAGNOSIS — E782 Mixed hyperlipidemia: Secondary | ICD-10-CM

## 2021-10-06 DIAGNOSIS — E1169 Type 2 diabetes mellitus with other specified complication: Secondary | ICD-10-CM

## 2021-10-06 DIAGNOSIS — N184 Chronic kidney disease, stage 4 (severe): Secondary | ICD-10-CM | POA: Diagnosis present

## 2021-10-06 DIAGNOSIS — Z8673 Personal history of transient ischemic attack (TIA), and cerebral infarction without residual deficits: Secondary | ICD-10-CM | POA: Diagnosis not present

## 2021-10-06 DIAGNOSIS — E1122 Type 2 diabetes mellitus with diabetic chronic kidney disease: Secondary | ICD-10-CM

## 2021-10-06 DIAGNOSIS — R0603 Acute respiratory distress: Secondary | ICD-10-CM | POA: Diagnosis present

## 2021-10-06 DIAGNOSIS — I5033 Acute on chronic diastolic (congestive) heart failure: Secondary | ICD-10-CM | POA: Diagnosis present

## 2021-10-06 DIAGNOSIS — I2510 Atherosclerotic heart disease of native coronary artery without angina pectoris: Secondary | ICD-10-CM | POA: Diagnosis present

## 2021-10-06 DIAGNOSIS — I501 Left ventricular failure: Secondary | ICD-10-CM | POA: Diagnosis present

## 2021-10-06 DIAGNOSIS — Z794 Long term (current) use of insulin: Secondary | ICD-10-CM

## 2021-10-06 DIAGNOSIS — E11621 Type 2 diabetes mellitus with foot ulcer: Secondary | ICD-10-CM | POA: Diagnosis present

## 2021-10-06 DIAGNOSIS — Z20822 Contact with and (suspected) exposure to covid-19: Secondary | ICD-10-CM | POA: Diagnosis present

## 2021-10-06 DIAGNOSIS — E1149 Type 2 diabetes mellitus with other diabetic neurological complication: Secondary | ICD-10-CM | POA: Diagnosis present

## 2021-10-06 DIAGNOSIS — Z87891 Personal history of nicotine dependence: Secondary | ICD-10-CM | POA: Diagnosis not present

## 2021-10-06 DIAGNOSIS — R0902 Hypoxemia: Secondary | ICD-10-CM | POA: Diagnosis present

## 2021-10-06 DIAGNOSIS — E1142 Type 2 diabetes mellitus with diabetic polyneuropathy: Secondary | ICD-10-CM | POA: Diagnosis present

## 2021-10-06 DIAGNOSIS — L89629 Pressure ulcer of left heel, unspecified stage: Secondary | ICD-10-CM | POA: Diagnosis present

## 2021-10-06 DIAGNOSIS — I130 Hypertensive heart and chronic kidney disease with heart failure and stage 1 through stage 4 chronic kidney disease, or unspecified chronic kidney disease: Secondary | ICD-10-CM | POA: Diagnosis present

## 2021-10-06 DIAGNOSIS — L89619 Pressure ulcer of right heel, unspecified stage: Secondary | ICD-10-CM | POA: Diagnosis present

## 2021-10-06 DIAGNOSIS — Z79899 Other long term (current) drug therapy: Secondary | ICD-10-CM | POA: Diagnosis not present

## 2021-10-06 DIAGNOSIS — L97529 Non-pressure chronic ulcer of other part of left foot with unspecified severity: Secondary | ICD-10-CM | POA: Diagnosis present

## 2021-10-06 DIAGNOSIS — E1143 Type 2 diabetes mellitus with diabetic autonomic (poly)neuropathy: Secondary | ICD-10-CM | POA: Diagnosis present

## 2021-10-06 DIAGNOSIS — K3184 Gastroparesis: Secondary | ICD-10-CM | POA: Diagnosis present

## 2021-10-06 DIAGNOSIS — I272 Pulmonary hypertension, unspecified: Secondary | ICD-10-CM | POA: Diagnosis present

## 2021-10-06 LAB — CBC WITH DIFFERENTIAL/PLATELET
Abs Immature Granulocytes: 0.03 10*3/uL (ref 0.00–0.07)
Basophils Absolute: 0.1 10*3/uL (ref 0.0–0.1)
Basophils Relative: 1 %
Eosinophils Absolute: 0.2 10*3/uL (ref 0.0–0.5)
Eosinophils Relative: 2 %
HCT: 32.1 % — ABNORMAL LOW (ref 36.0–46.0)
Hemoglobin: 9.8 g/dL — ABNORMAL LOW (ref 12.0–15.0)
Immature Granulocytes: 0 %
Lymphocytes Relative: 20 %
Lymphs Abs: 2.1 10*3/uL (ref 0.7–4.0)
MCH: 27.9 pg (ref 26.0–34.0)
MCHC: 30.5 g/dL (ref 30.0–36.0)
MCV: 91.5 fL (ref 80.0–100.0)
Monocytes Absolute: 0.9 10*3/uL (ref 0.1–1.0)
Monocytes Relative: 8 %
Neutro Abs: 7.5 10*3/uL (ref 1.7–7.7)
Neutrophils Relative %: 69 %
Platelets: 294 10*3/uL (ref 150–400)
RBC: 3.51 MIL/uL — ABNORMAL LOW (ref 3.87–5.11)
RDW: 14.8 % (ref 11.5–15.5)
WBC: 10.8 10*3/uL — ABNORMAL HIGH (ref 4.0–10.5)
nRBC: 0 % (ref 0.0–0.2)

## 2021-10-06 LAB — ECHOCARDIOGRAM COMPLETE
Area-P 1/2: 3.89 cm2
S' Lateral: 3.2 cm

## 2021-10-06 LAB — COMPREHENSIVE METABOLIC PANEL
ALT: 16 U/L (ref 0–44)
ALT: 16 U/L (ref 0–44)
AST: 11 U/L — ABNORMAL LOW (ref 15–41)
AST: 10 U/L — ABNORMAL LOW (ref 15–41)
Albumin: 2.8 g/dL — ABNORMAL LOW (ref 3.5–5.0)
Albumin: 2.7 g/dL — ABNORMAL LOW (ref 3.5–5.0)
Alkaline Phosphatase: 55 U/L (ref 38–126)
Alkaline Phosphatase: 53 U/L (ref 38–126)
Anion gap: 7 (ref 5–15)
Anion gap: 8 (ref 5–15)
BUN: 25 mg/dL — ABNORMAL HIGH (ref 8–23)
BUN: 24 mg/dL — ABNORMAL HIGH (ref 8–23)
CO2: 24 mmol/L (ref 22–32)
CO2: 26 mmol/L (ref 22–32)
Calcium: 8.8 mg/dL — ABNORMAL LOW (ref 8.9–10.3)
Calcium: 8.9 mg/dL (ref 8.9–10.3)
Chloride: 104 mmol/L (ref 98–111)
Chloride: 104 mmol/L (ref 98–111)
Creatinine, Ser: 2.62 mg/dL — ABNORMAL HIGH (ref 0.44–1.00)
Creatinine, Ser: 2.55 mg/dL — ABNORMAL HIGH (ref 0.44–1.00)
GFR, Estimated: 20 mL/min — ABNORMAL LOW (ref >60)
GFR, Estimated: 21 mL/min — ABNORMAL LOW (ref >60)
Glucose, Bld: 196 mg/dL — ABNORMAL HIGH (ref 70–99)
Glucose, Bld: 143 mg/dL — ABNORMAL HIGH (ref 70–99)
Potassium: 4.3 mmol/L (ref 3.5–5.1)
Potassium: 4.1 mmol/L (ref 3.5–5.1)
Sodium: 135 mmol/L (ref 135–145)
Sodium: 138 mmol/L (ref 135–145)
Total Bilirubin: 0.7 mg/dL (ref 0.3–1.2)
Total Bilirubin: 0.9 mg/dL (ref 0.3–1.2)
Total Protein: 7.2 g/dL (ref 6.5–8.1)
Total Protein: 6.9 g/dL (ref 6.5–8.1)

## 2021-10-06 LAB — URINALYSIS, ROUTINE W REFLEX MICROSCOPIC
Bacteria, UA: NONE SEEN
Bilirubin Urine: NEGATIVE
Glucose, UA: 50 mg/dL — AB
Ketones, ur: NEGATIVE mg/dL
Leukocytes,Ua: NEGATIVE
Nitrite: NEGATIVE
Protein, ur: >=300 mg/dL — AB
Specific Gravity, Urine: 1.009 (ref 1.005–1.030)
pH: 5 (ref 5.0–8.0)

## 2021-10-06 LAB — TROPONIN I (HIGH SENSITIVITY)
Troponin I (High Sensitivity): 12 ng/L (ref <18)
Troponin I (High Sensitivity): 17 ng/L (ref <18)

## 2021-10-06 LAB — GLUCOSE, CAPILLARY
Glucose-Capillary: 93 mg/dL (ref 70–99)
Glucose-Capillary: 112 mg/dL — ABNORMAL HIGH (ref 70–99)

## 2021-10-06 LAB — MRSA NEXT GEN BY PCR, NASAL: MRSA by PCR Next Gen: NOT DETECTED

## 2021-10-06 LAB — CBG MONITORING, ED
Glucose-Capillary: 123 mg/dL — ABNORMAL HIGH (ref 70–99)
Glucose-Capillary: 139 mg/dL — ABNORMAL HIGH (ref 70–99)
Glucose-Capillary: 105 mg/dL — ABNORMAL HIGH (ref 70–99)

## 2021-10-06 LAB — RESP PANEL BY RT-PCR (FLU A&B, COVID) ARPGX2
Influenza A by PCR: NEGATIVE
Influenza B by PCR: NEGATIVE
SARS Coronavirus 2 by RT PCR: NEGATIVE

## 2021-10-06 LAB — BRAIN NATRIURETIC PEPTIDE: B Natriuretic Peptide: 545.6 pg/mL — ABNORMAL HIGH (ref 0.0–100.0)

## 2021-10-06 LAB — MAGNESIUM: Magnesium: 2.1 mg/dL (ref 1.7–2.4)

## 2021-10-06 MED ORDER — FUROSEMIDE 10 MG/ML IJ SOLN: 40.0000 mg | Freq: Once | INTRAMUSCULAR | Status: DC

## 2021-10-06 MED ORDER — CALCIUM ACETATE (PHOS BINDER) 667 MG PO CAPS
667.0000 mg | ORAL_CAPSULE | Freq: Three times a day (TID) | ORAL | Status: DC
  Administered 2021-10-06 – 2021-10-10 (×12): 667 mg via ORAL
  Filled 2021-10-06 (×11): qty 1

## 2021-10-06 MED ORDER — ACETAMINOPHEN 325 MG PO TABS
650.0000 mg | ORAL_TABLET | Freq: Four times a day (QID) | ORAL | Status: DC | PRN
  Administered 2021-10-06 – 2021-10-08 (×5): 650 mg via ORAL
  Filled 2021-10-06 (×5): qty 2

## 2021-10-06 MED ORDER — INSULIN ASPART 100 UNIT/ML IJ SOLN
4.0000 [IU] | Freq: Three times a day (TID) | INTRAMUSCULAR | Status: DC
  Administered 2021-10-06 – 2021-10-08 (×3): 4 [IU] via SUBCUTANEOUS

## 2021-10-06 MED ORDER — POLYETHYLENE GLYCOL 3350 17 G PO PACK
17.0000 g | PACK | Freq: Every day | ORAL | Status: DC | PRN
  Administered 2021-10-08: 17 g via ORAL
  Filled 2021-10-06: qty 1

## 2021-10-06 MED ORDER — CARVEDILOL 12.5 MG PO TABS
12.5000 mg | ORAL_TABLET | Freq: Two times a day (BID) | ORAL | Status: DC
  Administered 2021-10-06 – 2021-10-10 (×9): 12.5 mg via ORAL
  Filled 2021-10-06 (×9): qty 1

## 2021-10-06 MED ORDER — ATORVASTATIN CALCIUM 40 MG PO TABS
40.0000 mg | ORAL_TABLET | Freq: Every day | ORAL | Status: DC
  Administered 2021-10-06 – 2021-10-10 (×5): 40 mg via ORAL
  Filled 2021-10-06 (×5): qty 1

## 2021-10-06 MED ORDER — DULOXETINE HCL 20 MG PO CPEP
20.0000 mg | ORAL_CAPSULE | Freq: Every day | ORAL | Status: DC
  Administered 2021-10-06 – 2021-10-10 (×5): 20 mg via ORAL
  Filled 2021-10-06 (×5): qty 1

## 2021-10-06 MED ORDER — ENOXAPARIN SODIUM 30 MG/0.3ML IJ SOSY
30.0000 mg | PREFILLED_SYRINGE | Freq: Every day | INTRAMUSCULAR | Status: DC
  Administered 2021-10-06 – 2021-10-10 (×5): 30 mg via SUBCUTANEOUS
  Filled 2021-10-06 (×5): qty 0.3

## 2021-10-06 MED ORDER — METOLAZONE 2.5 MG PO TABS
2.5000 mg | ORAL_TABLET | Freq: Every day | ORAL | Status: DC
  Administered 2021-10-06: 2.5 mg via ORAL
  Filled 2021-10-06: qty 1

## 2021-10-06 MED ORDER — FERROUS SULFATE 325 (65 FE) MG PO TABS
325.0000 mg | ORAL_TABLET | Freq: Every day | ORAL | Status: DC
  Administered 2021-10-06 – 2021-10-10 (×5): 325 mg via ORAL
  Filled 2021-10-06 (×5): qty 1

## 2021-10-06 MED ORDER — FUROSEMIDE 10 MG/ML IJ SOLN
60.0000 mg | Freq: Two times a day (BID) | INTRAMUSCULAR | Status: DC
  Administered 2021-10-06 (×2): 60 mg via INTRAVENOUS
  Filled 2021-10-06 (×2): qty 6

## 2021-10-06 MED ORDER — INSULIN ASPART 100 UNIT/ML IJ SOLN
0.0000 [IU] | Freq: Three times a day (TID) | INTRAMUSCULAR | Status: DC
  Administered 2021-10-06 – 2021-10-09 (×3): 2 [IU] via SUBCUTANEOUS

## 2021-10-06 MED ORDER — RISPERIDONE 3 MG PO TABS
3.0000 mg | ORAL_TABLET | Freq: Every day | ORAL | Status: DC
  Administered 2021-10-06 – 2021-10-09 (×4): 3 mg via ORAL
  Filled 2021-10-06 (×5): qty 1

## 2021-10-06 MED ORDER — OXYCODONE-ACETAMINOPHEN 5-325 MG PO TABS
1.0000 | ORAL_TABLET | Freq: Once | ORAL | Status: AC
  Administered 2021-10-06: 1 via ORAL
  Filled 2021-10-06: qty 1

## 2021-10-06 MED ORDER — LABETALOL HCL 5 MG/ML IV SOLN
20.0000 mg | INTRAVENOUS | Status: DC | PRN
  Administered 2021-10-06 (×2): 20 mg via INTRAVENOUS
  Filled 2021-10-06 (×2): qty 4

## 2021-10-06 MED ORDER — ASPIRIN EC 81 MG PO TBEC
81.0000 mg | DELAYED_RELEASE_TABLET | Freq: Every day | ORAL | Status: DC
  Administered 2021-10-06 – 2021-10-10 (×5): 81 mg via ORAL
  Filled 2021-10-06 (×5): qty 1

## 2021-10-06 MED ORDER — INSULIN GLARGINE 100 UNIT/ML ~~LOC~~ SOLN
40.0000 [IU] | Freq: Every day | SUBCUTANEOUS | Status: DC
  Administered 2021-10-06: 40 [IU] via SUBCUTANEOUS
  Filled 2021-10-06 (×2): qty 0.4

## 2021-10-06 MED ORDER — AMLODIPINE BESYLATE 10 MG PO TABS
10.0000 mg | ORAL_TABLET | Freq: Every day | ORAL | Status: DC
  Administered 2021-10-06 – 2021-10-10 (×5): 10 mg via ORAL
  Filled 2021-10-06: qty 2
  Filled 2021-10-06 (×4): qty 1

## 2021-10-06 MED ORDER — ONDANSETRON HCL 4 MG PO TABS: 4.0000 mg | ORAL_TABLET | Freq: Four times a day (QID) | ORAL | Status: DC | PRN

## 2021-10-06 MED ORDER — SODIUM CHLORIDE 0.9 % IV SOLN
6.2500 mg | Freq: Once | INTRAVENOUS | Status: AC
  Administered 2021-10-06: 6.25 mg via INTRAVENOUS
  Filled 2021-10-06: qty 0.25

## 2021-10-06 MED ORDER — ACETAMINOPHEN 650 MG RE SUPP: 650.0000 mg | Freq: Four times a day (QID) | RECTAL | Status: DC | PRN

## 2021-10-06 MED ORDER — FUROSEMIDE 10 MG/ML IJ SOLN
60.0000 mg | Freq: Once | INTRAMUSCULAR | Status: AC
  Administered 2021-10-06: 60 mg via INTRAVENOUS
  Filled 2021-10-06: qty 6

## 2021-10-06 MED ORDER — ONDANSETRON HCL 4 MG/2ML IJ SOLN
4.0000 mg | Freq: Four times a day (QID) | INTRAMUSCULAR | Status: DC | PRN
  Administered 2021-10-06: 4 mg via INTRAVENOUS
  Filled 2021-10-06: qty 2

## 2021-10-06 NOTE — ED Notes (Signed)
Unable to obtain SpO2 reading on hands/ear/forehead/toes

## 2021-10-06 NOTE — ED Notes (Signed)
Admitting MD team into room, at [facility].

## 2021-10-06 NOTE — Progress Notes (Signed)
  Heart Failure Navigation Team  - Followed in the [facility]. HF CM will assess.   FU Appointment  10/10/21 at 10:30   [place] NP-C   3:00 PM

## 2021-10-06 NOTE — Assessment & Plan Note (Signed)
.   Patient been placed on Accu-Cheks before every meal and nightly with sliding scale insulin . Placing patient on reduced regimen of basal insulin therapy due to tenuous oral intake . Prandial insulin additionally ordered . Hemoglobin A1c 6.8% in September. . Diabetic Diet

## 2021-10-06 NOTE — H&P (Signed)
History and Physical    Nancy [name] JTT:[id number] DOB: 18-Aug-1961 DOA: 10/05/2021  PCP: [name], MD  Patient coming from: Home via EMS   Chief Complaint:  Chief Complaint  Patient presents with   Respiratory Distress     HPI:    60 year old female with past medical history of diastolic congestive heart failure (last Echo 05/2021 EF 55-60%), chronic kidney disease stage IV (baseline Cr 2.6-2.7), hypertension, insulin-dependent diabetes mellitus type 2 (hgb A1C 6.8% 08/2021), hyperlipidemia, peripheral artery disease (occluded right ICA, moderate left ICA stensosis), coronary artery disease (LHC 04/2021 with mild nonobstructive CAD), pulmonary hypertension, diabetic polyneuropathy, chronic left heel ulceration, previous stroke, nicotine dependence who presents to [facility] emergency department via EMS with complaints of shortness of breath.  Patient states that since her recent hospitalization and discharge in early October she has been developing progressively worsening bilateral lower extremity edema.  As the patient's edema progressively got worse patient began to develop shortness of breath.  Approximately 2 days ago the shortness of breath became particularly severe. Shortness with a severe in intensity, worse with minimal exertion and improved with rest.  Patient additionally complains of pillow orthopnea paroxysmal nocturnal dyspnea over the span of time.  Patient presented to wound care clinic on 10/19 and was told by the provider there that she was "full of fluid."  Patient denies fever, sick contacts, recent travel or contact with confirmed COVID-19 infection.    Patient's symptoms continue to progressively worsened to the point where patient is unable to complete sentences without having to take of breath.  EMS was then contacted who promptly came to evaluate the patient.  On their arrival, patient was noted to be saturating 70% on room air.  Patient  initially placed on nonrebreather mask but then later switched to CPAP.  Patient was then brought to [facility] emergency department for evaluation.    On evaluation in the emergency department patient continued to be in respiratory distress with attempts to wean patient off noninvasive positive pressure ventilation failing.  VBG was performed revealing a PCO2 of 48.4 with pH of 7.33.  Chest x-ray revealed evidence of cardiogenic pulmonary edema.  Troponins were found to be unremarkable at 12 and 17.  Patient was administered 60 mg of intravenous Lasix.  The hospitalist group was then called to assess the patient for admission to the hospital.    Review of Systems:   Review of Systems  Respiratory:  Positive for shortness of breath.   All other systems reviewed and are negative.  Past Medical History:  Diagnosis Date   Anemia 2006   Depression 2014   previously on amitryptiline    Diabetes mellitus with neurological manifestation ([place]) 2006   Diabetic peripheral neuropathy ([place]) 04/26/2020   Fracture of left ankle 1997    Gastroparesis 07/2016   HOH (hard of hearing) 2004    Hyperlipidemia 2006   Hypertension 2006   IBS (irritable bowel syndrome) 2002    Leukopenia 2015    Macular degeneration 11/2019   Shingles 2009    Stroke [facility]) 2020   Thyroid nodule 2004    Past Surgical History:  Procedure Laterality Date   ABDOMINAL HYSTERECTOMY  2005   CATARACT EXTRACTION Left 11/2019   CESAREAN SECTION  1983    CHOLECYSTECTOMY N/A 03/05/2020   Procedure: LAPAROSCOPIC CHOLECYSTECTOMY WITH INTRAOPERATIVE CHOLANGIOGRAM;  Surgeon: Donnie Mesa, MD;  Location: [facility] ORS;  Service: General;  Laterality: N/A;   ESOPHAGOGASTRODUODENOSCOPY (EGD) WITH PROPOFOL Left  08/26/2014   Procedure: ESOPHAGOGASTRODUODENOSCOPY (EGD) WITH PROPOFOL;  Surgeon: Arta Silence, MD;  Location: [facility] ENDOSCOPY;  Service: Endoscopy;  Laterality: Left;   ESOPHAGOGASTRODUODENOSCOPY (EGD) WITH PROPOFOL N/A 03/03/2020    Procedure: ESOPHAGOGASTRODUODENOSCOPY (EGD) WITH PROPOFOL;  Surgeon: Jerene Bears, MD;  Location: [facility] ENDOSCOPY;  Service: Gastroenterology;  Laterality: N/A;   RIGHT HEART CATH N/A 07/14/2021   Procedure: RIGHT HEART CATH;  Surgeon: Larey Dresser, MD;  Location: [place] CV LAB;  Service: Cardiovascular;  Laterality: N/A;   RIGHT/LEFT HEART CATH AND CORONARY ANGIOGRAPHY N/A 04/27/2021   Procedure: RIGHT/LEFT HEART CATH AND CORONARY ANGIOGRAPHY;  Surgeon: [name], MD;  Location: [place] CV LAB;  Service: Cardiovascular;  Laterality: N/A;     reports that she has quit smoking. Her smoking use included cigarettes. She has a 0.13 pack-year smoking history. She has never used smokeless tobacco. She reports that she does not drink alcohol and does not use drugs.  Allergies  Allergen Reactions   Elemental Sulfur Hives and Other (See Comments)    PATIENT STATED THIS, MORE THAN LIKELY, SHOULD HAVE BEEN LOGGED AS "SULFA"   Hydralazine Hcl Other (See Comments)    Hair loss   Hydrocodone Itching and Other (See Comments)    Upset stomach   Metformin And Related Nausea And Vomiting and Other (See Comments)    Stomach pains, also   Other Nausea Only and Other (See Comments)    Lettuce- Does not digest this!!   Plaquenil [Hydroxychloroquine Sulfate] Hives   Shellfish-Derived Products Nausea Only and Other (See Comments)    Caused an upset stomach   Shrimp (Diagnostic) Nausea Only and Other (See Comments)    Upset stomach    Sulfa Antibiotics Hives    Family History  Problem Relation Age of Onset   Hypertension Mother    Heart disease Mother    Diabetes Mother    Thyroid disease Mother    Congestive Heart Failure Mother    Breast cancer Maternal Grandmother    Colon cancer Maternal Grandfather    Heart attack Sister    Heart disease Brother    Hyperlipidemia Brother    Hypertension Brother    Diabetes Father    Breast cancer Maternal Aunt      Prior to Admission  medications   Medication Sig Start Date End Date Taking? Authorizing Provider  acetaminophen (TYLENOL) 500 MG tablet Take 500 mg by mouth every 6 (six) hours as needed for moderate pain or headache.    [provider]  amLODipine (NORVASC) 10 MG tablet Take 1 tablet (10 mg total) by mouth daily. 09/27/21   [name], MD  aspirin 81 MG EC tablet Take 1 tablet (81 mg total) by mouth daily. 09/26/21   Milford, Maricela Bo, FNP  atorvastatin (LIPITOR) 40 MG tablet Take 1 tablet (40 mg total) by mouth daily. 09/26/21   Milford, Maricela Bo, FNP  Blood Glucose Monitoring Suppl (ONETOUCH VERIO) w/Device KIT Check blood sugar three times daily. E11.40 05/06/20   [name], MD  Blood Glucose Monitoring Suppl (TRUE METRIX METER) w/Device KIT Check blood sugar three times daily. E11.40 07/11/20   [name], MD  calcium acetate (PHOSLO) 667 MG capsule Take 1 capsule (667 mg total) by mouth 3 (three) times daily with meals. 09/09/21 10/09/21  Rick Duff, MD  carvedilol (COREG) 12.5 MG tablet Take 1 tablet (12.5 mg total) by mouth 2 (two) times daily with a meal. 09/26/21   Milford, Maricela Bo,  FNP  DULoxetine (CYMBALTA) 20 MG capsule TAKE 1 CAPSULE (20 MG TOTAL) BY MOUTH DAILY. 02/06/21 02/06/22  Salley Slaughter, NP  ferrous sulfate 325 (65 FE) MG tablet Take 1 tablet (325 mg total) by mouth daily with breakfast. 08/01/21   [name], MD  glucose blood test strip Use as instructedCheck blood sugar three times daily. E11.40 07/11/20   [name], MD  insulin glargine (LANTUS) 100 UNIT/ML Solostar Pen Inject 55 Units into the skin at bedtime. 06/30/21   [name], MD  insulin lispro (HUMALOG KWIKPEN) 100 UNIT/ML KwikPen Inject 4 units before breakfast and dinner. Patient taking differently: Inject 4 Units into the skin every evening. 07/05/20   Camillia Herter, NP  risperiDONE (RISPERDAL) 3 MG tablet TAKE 1 TABLET (3 MG TOTAL) BY MOUTH AT BEDTIME. 02/06/21  02/06/22  Eulis Canner E, NP  senna-docusate (SENOKOT-S) 8.6-50 MG tablet Take 2 tablets by mouth at bedtime. 09/21/21 10/21/21  [name], MD  torsemide (DEMADEX) 20 MG tablet Take 2 tablets (40 mg total) by mouth daily. 09/26/21   Milford, Maricela Bo, FNP  Insulin NPH Isophane & Regular (RELION 70/30 [place]) Inject 35 Units into the skin 2 (two) times daily.  08/17/14  [provider]    Physical Exam: Vitals:   10/06/21 0205 10/06/21 0315 10/06/21 0330 10/06/21 0400  BP: (!) 184/91 (!) 158/76 (!) 146/74 (!) 176/87  Pulse: 84 74    Resp: $Remo'20 11 13 12  'tOobQ$ Temp:      TempSrc:      SpO2: 98% 100%      Constitutional: Awake alert and oriented x3, no associated distress.   Skin: Dressing of right foot clean dry and intact.  No rashes, no lesions, good skin turgor noted. Eyes: Pupils are equally reactive to light.  No evidence of scleral icterus or conjunctival pallor.  ENMT: Moist mucous membranes noted.  Posterior pharynx clear of any exudate or lesions.   Neck: normal, supple, no masses, no thyromegaly.  Slightly elevated jugular venous pulse at 45 degrees.   Respiratory: Bibasilar rales with scattered rhonchi bilaterally.  No evidence of wheezing.  Increased respiratory effort with some evidence of accessory muscle use.   Cardiovascular: Regular rate and rhythm, no murmurs / rubs / gallops.  Extensive bilateral lower extremity pitting edema that tracks in the feet all the way up to the lower back.  2+ pedal pulses. No carotid bruits.  Chest:   Nontender without crepitus or deformity.   Back:   Nontender without crepitus or deformity. Abdomen: Abdomen is protuberant but soft and nontender.  No evidence of intra-abdominal masses.  Positive bowel sounds noted in all quadrants.   Musculoskeletal: Significant edema of the bilateral lower extremities as noted in the cardiovascular examination.  Dressing of the left foot as noted in the skin examination.  No joint deformity upper and lower  extremities. Good ROM, no contractures. Normal muscle tone.  Neurologic: CN 2-12 grossly intact. Sensation intact.  Patient moving all 4 extremities spontaneously.  Patient is following all commands.  Patient is responsive to verbal stimuli.   Psychiatric: Patient exhibits normal mood with appropriate affect.  Patient seems to possess insight as to their current situation.     Labs on Admission: I have personally reviewed following labs and imaging studies -   CBC: Recent Labs  Lab 10/05/21 2223 10/05/21 2324  WBC 11.7*  --   NEUTROABS 9.1*  --   HGB 10.0* 10.9*  HCT 33.7* 32.0*  MCV 93.6  --   PLT 299  --    Basic Metabolic Panel: Recent Labs  Lab 10/05/21 2223 10/05/21 2324  NA 135 138  K 4.3 4.3  CL 104  --   CO2 24  --   GLUCOSE 196*  --   BUN 25*  --   CREATININE 2.62*  --   CALCIUM 8.8*  --    GFR: Estimated Creatinine Clearance: 26.5 mL/min (A) (by C-G formula based on SCr of 2.62 mg/dL (H)). Liver Function Tests: Recent Labs  Lab 10/05/21 2223  AST 11*  ALT 16  ALKPHOS 55  BILITOT 0.7  PROT 7.2  ALBUMIN 2.8*   No results for input(s): LIPASE, AMYLASE in the last 168 hours. No results for input(s): AMMONIA in the last 168 hours. Coagulation Profile: No results for input(s): INR, PROTIME in the last 168 hours. Cardiac Enzymes: No results for input(s): CKTOTAL, CKMB, CKMBINDEX, TROPONINI in the last 168 hours. BNP (last 3 results) No results for input(s): PROBNP in the last 8760 hours. HbA1C: No results for input(s): HGBA1C in the last 72 hours. CBG: No results for input(s): GLUCAP in the last 168 hours. Lipid Profile: No results for input(s): CHOL, HDL, LDLCALC, TRIG, CHOLHDL, LDLDIRECT in the last 72 hours. Thyroid Function Tests: No results for input(s): TSH, T4TOTAL, FREET4, T3FREE, THYROIDAB in the last 72 hours. Anemia Panel: No results for input(s): VITAMINB12, FOLATE, FERRITIN, TIBC, IRON, RETICCTPCT in the last 72 hours. Urine analysis:     Component Value Date/Time   COLORURINE YELLOW 10/06/2021 0138   APPEARANCEUR CLEAR 10/06/2021 0138   LABSPEC 1.009 10/06/2021 0138   PHURINE 5.0 10/06/2021 0138   GLUCOSEU 50 (A) 10/06/2021 0138   HGBUR MODERATE (A) 10/06/2021 0138   BILIRUBINUR NEGATIVE 10/06/2021 0138   BILIRUBINUR negative 12/26/2017 1458   KETONESUR NEGATIVE 10/06/2021 0138   PROTEINUR >=300 (A) 10/06/2021 0138   UROBILINOGEN 0.2 12/26/2017 1458   UROBILINOGEN 0.2 09/07/2014 0857   NITRITE NEGATIVE 10/06/2021 0138   LEUKOCYTESUR NEGATIVE 10/06/2021 0138    Radiological Exams on Admission - Personally Reviewed: DG Chest Portable 1 View  Result Date: 10/05/2021 CLINICAL DATA:  Shortness of breath EXAM: PORTABLE CHEST 1 VIEW COMPARISON:  08/12/2021 FINDINGS: Cardiac shadow is mildly prominent but stable. Vascular congestion is seen bilaterally without focal edema. Small left pleural effusion is noted. IMPRESSION: Changes of vascular congestion without significant edema. Small left pleural effusion. Electronically Signed   By: Inez Catalina M.D.   On: 10/05/2021 23:22    EKG: Personally reviewed.  Rhythm is normal sinus rhythm with heart rate of 97 bpm.  No dynamic ST segment changes appreciated.  Assessment/Plan  * Acute on chronic diastolic CHF (congestive heart failure) ([place]) Patient presenting with severe shortness of breath, pulmonary rales on examination, evidence of cardiogenic pulm edema on chest x-ray concerning for recurrent acute on chronic diastolic congestive heart failure. Due to extreme work of breathing in route and in the emergency department patient is requiring noninvasive positive pressure ventilation Patient's been placed on furosemide 60 mg twice daily.  We will additionally add metolazone 2.5 mg daily Strict input and output monitoring Supplemental oxygen for bouts of hypoxia Monitoring renal function and electrolytes with serial chemistires Admitting to progressive unit   Acute  cardiogenic pulmonary edema (HCC) Please see assessment and plan above   Coronary artery disease involving native coronary artery of native heart without angina pectoris Patient is currently chest pain free Monitoring patient on telemetry Continue home regimen of  antiplatelet therapy, lipid lowering therapy and AV nodal blocking therapy   Type 2 diabetes mellitus with stage 4 chronic kidney disease, with long-term current use of insulin ([place]) Patient been placed on Accu-Cheks before every meal and nightly with sliding scale insulin Placing patient on reduced regimen of basal insulin therapy due to tenuous oral intake Prandial insulin additionally ordered Hemoglobin A1c 6.8% in September. Diabetic Diet   CKD (chronic kidney disease), stage IV (HCC) Strict intake and output monitoring Creatinine near baseline Minimizing nephrotoxic agents as much as possible Serial chemistries to monitor renal function and electrolytes   Hypertensive urgency Patient presenting with markedly elevated blood pressures in the emergency department as high as 195/79 Uncontrolled blood pressures is likely exacerbating patient's presentation Resuming home regimen of antihypertensive therapy Additionally provided patient with as needed dosing intravenous labetalol for markedly elevated blood pressures If this is unsuccessful and patient's respiratory distress worsens we will consider nitroglycerin infusion.    Code Status:  Full code  code status decision has been confirmed with: patient Family Communication: deferred   Status is: Inpatient  Remains inpatient appropriate because: Severe respiratory distress due to severe cardiogenic volume overload with cardiogenic pulmonary edema and acute on chronic diastolic congestive heart failure requiring aggressive dosing of intravenous diuretics and noninvasive positive pressure ventilation with close monitoring in the progressive unit.        Vernelle Emerald MD [facility] Pager [phone]  If 7PM-7AM, please contact night-coverage [email or web address] Use universal [place] password for that web site. If you do not have the password, please call the hospital operator.  10/06/2021, 4:30 AM

## 2021-10-06 NOTE — Consult Note (Signed)
[place] Nurse Consult Note: Reason for Consult:Patient with critical limb ischemia bilaterally, seen recently by both VVS (Dr. Trula Slade, 9/21) and Orthopedics (Dr. Sharol Given, 9/22) who both recommend amputation.  Patient has refused.  Seen in the past by my associates S. Avel Sensor on 9/20, 9/26 and 10/4.I will continue their orders/recommendations for Palliative wound care for these nonhealable wounds. Wound type: Arterial insufficiency Pressure Injury POA: N/A Measurement:Left heel pad with gangrenous ulceration. Bilateral LEs with scattered open areas with nonviable tissue. Wound bed:As noted above Drainage (amount, consistency, odor) serosanguinous with odor. Periwound:As noted above. Dressing procedure/placement/frequency: As noted above, amputation is indicated.  In the interim, conservative care orders consisting of daily washing of LEs with soap and water.  Apply a generous layer of house moisturizing cream (Sween 24) to the LEs and feet, but not over ulcerated areas. Apply xeroform gauze to open areas at the anterior aspects of feet. Paint the left heel with betadine swabstick and allow to air-dry. When dry, cover with dry gauze and secure with Kerlix roll gauze wrapped from just below toes to just below knees/paper tape. Place feet into [employer]. A silicone foam bordered dressing is to be placed over the sacrum for pressure injury prevention.  Recommend reconsult Dr. Sharol Given or Dr. Trula Slade for continued discussion regarding amputation.  [place] nursing team will not follow, but will remain available to this patient, the nursing and medical teams.  Please re-consult if needed. Thanks, Maudie Flakes, MSN, RN, [place], Arther Abbott  Pager# [phone]

## 2021-10-06 NOTE — ED Notes (Addendum)
MD Shalhoub at bedside 

## 2021-10-06 NOTE — ED Notes (Signed)
Sleeping/resting. Arousable to voice. Interactive. Not eating meal tray in front of her.

## 2021-10-06 NOTE — ED Notes (Signed)
Admitting MD at BS.  

## 2021-10-06 NOTE — Assessment & Plan Note (Signed)
•   Patient is currently chest pain free °• Monitoring patient on telemetry °• Continue home regimen of antiplatelet therapy, lipid lowering therapy and AV nodal blocking therapy ° °

## 2021-10-06 NOTE — Assessment & Plan Note (Signed)
   Dressing is clean dry and intact and currently, no clinical concern for infection.  Patient was last seen in her wound care clinic on 10/19.  Wound care consultation placed for continued wound care during hospitalization.

## 2021-10-06 NOTE — Progress Notes (Addendum)
Subjective: No acute overnight events. Patient was seen at bedside during rounds today.   Pt reports feeling "much better". Pt reports improvement in [facility]. She states that she sleeps in a recliner at home, and is resting comfortably at a 40 degree angle in bed. She denies CP, abd pain, confusion, and palpitations. No other complaints or concerns at this time.   Pt is updated on the plan for today, and all questions are addressed.   Objective:  Vital signs in last 24 hours: Vitals:   10/06/21 1045 10/06/21 1100 10/06/21 1130 10/06/21 1145  BP: 111/65 (!) 157/73 (!) 143/61 124/69  Pulse: 68 69 68 66  Resp: 11 10 20  (!) 21  Temp:      TempSrc:      SpO2: 97% 97% 98% 98%   Constitutional: awake and alert, well-appearing, in NAD HENT: normocephalic, atraumatic Eyes: conjunctiva non-erythematous, EOMI Cardiovascular: RRR, no m/r/g, 3+ bilateral LE pitting edema up to knees Pulmonary/Chest: mildly increased respiratory effort on BiPAP, LCTAB, no appreciable wheezing. Abdominal: soft, non-tender to palpation, non-distended MSK: normal bulk and tone Neurological: A&O x 3 Skin: warm and dry Psych: normal behavior, normal affect   Assessment/Plan:  Principal Problem:   Acute on chronic diastolic CHF (congestive heart failure) (HCC) Active Problems:   Hypertensive urgency   Type 2 diabetes mellitus with stage 4 chronic kidney disease, with long-term current use of insulin (HCC)   Mixed diabetic hyperlipidemia associated with type 2 diabetes mellitus (HCC)   CKD (chronic kidney disease), stage IV (HCC)   Diabetic ulcer of left foot (HCC)   Acute cardiogenic pulmonary edema (HCC)   Coronary artery disease involving native coronary artery of native heart without angina pectoris   Nancy [name] is a 60 year old female with PMH of HFpEF (05/2021 EF 55-60%), CKD stage 4 (baseline Cr 2.6-2.7), HTN, T2DM (hgb A1C 6.8% 08/2021), HLD, PAD, CAD (LHC 04/2021 with mild nonobstructive CAD),  pulmonary HTN, diabetic polyneuropathy, chronic left heel ulceration, and previous stroke, admitted to the ED for acute on chronic HFpEF exacerbation.   Acute on chronic HFpEF exacerbation in setting of hypertensive urgency  Acute cardiogenic pulmonary edema  05/2021 EF 55-60%, repeat 2D echo pending. Pt reporting subjective improvement in [facility] and orthopnea. 3+ bilateral pitting edema up to knees. No longer requiring BiPAP and saturating well on RA at 98%. BP improved to 120/70s. Net output ~900 cc.  - continue IV lasix 60 bid  - continue metolazone 2.5 mg qd  - resume home coreg 12.5 bid - labetalol 20 IV prn  - f/u on repeat 2D echo  - strict I/O and daily weights  - supplemental O2 as needed - continue to monitor kidney function    Hx CAD  Chest pain free. CTM.  - Telemetry  - Continue ASA 81 and atorvastatin   T2DM A1c 6.8 in Sept. - SSI - Novolog 4 U tid - Lantus 40 U daily at bedtime  CKD 4 Cr near baseline  - avoid nephrotoxic agents  - CTM BMP  Gangrenous Diabetic Foot Ulcer, heel of left foot Left and Right Dorsal Foot Skin Lacerations No active complaints. She is s/p abx treatment. Continue ASA 81 and lipitor 40 qd.  - Continue to follow wound care consults    Best Practice: Diet: Carb-Modified IVF: None,None VTE: Enoxaparin Code: Full   [name], MD  Internal Medicine Resident, PGY-1 [name]  Pager: [phone] After 5pm on weekdays and 1pm on weekends: On Call  pager [phone]

## 2021-10-06 NOTE — ED Notes (Signed)
Lunch ordered 

## 2021-10-06 NOTE — Assessment & Plan Note (Signed)
.   Continuing home regimen of lipid lowering therapy.  

## 2021-10-06 NOTE — Assessment & Plan Note (Signed)
Strict intake and output monitoring Creatinine near baseline Minimizing nephrotoxic agents as much as possible Serial chemistries to monitor renal function and electrolytes  

## 2021-10-06 NOTE — Progress Notes (Signed)
Pt placed back on Bipap per pt's request. Pt on previous Bipap settings.

## 2021-10-06 NOTE — ED Notes (Signed)
Reports HA and nausea resolved.

## 2021-10-06 NOTE — ED Notes (Signed)
MD Shalhoub made aware of pt BP

## 2021-10-06 NOTE — Assessment & Plan Note (Signed)
·   Please see assessment and plan above °

## 2021-10-06 NOTE — ED Notes (Signed)
MD Shalhoub notified of pt BP

## 2021-10-06 NOTE — Assessment & Plan Note (Signed)
.   Patient presenting with severe shortness of breath, pulmonary rales on examination, evidence of cardiogenic pulm edema on chest x-ray concerning for recurrent acute on chronic diastolic congestive heart failure. . Due to extreme work of breathing in route and in the emergency department patient is requiring noninvasive positive pressure ventilation . Patient's been placed on furosemide 60 mg twice daily.  We will additionally add metolazone 2.5 mg daily . Strict input and output monitoring . Supplemental oxygen for bouts of hypoxia . Monitoring renal function and electrolytes with serial chemistires . Admitting to progressive unit

## 2021-10-06 NOTE — Progress Notes (Signed)
Pt taken off Bipap and placed on NRB mask per PA request.  Pt tolerating well at this time. Bipap on standby if pt develops SOB.

## 2021-10-06 NOTE — ED Notes (Signed)
Bladder Scan 351ml

## 2021-10-06 NOTE — ED Notes (Signed)
Pt calling out to state that she feels like she cannot breathe on NRB and asking to be placed back on bipap - RT notified - MD notified

## 2021-10-06 NOTE — Assessment & Plan Note (Signed)
   Patient presenting with markedly elevated blood pressures in the emergency department as high as 195/79  Uncontrolled blood pressures is likely exacerbating patient's presentation  Resuming home regimen of antihypertensive therapy  Additionally provided patient with as needed dosing intravenous labetalol for markedly elevated blood pressures  If this is unsuccessful and patient's respiratory distress worsens we will consider nitroglycerin infusion.

## 2021-10-07 DIAGNOSIS — I5033 Acute on chronic diastolic (congestive) heart failure: Secondary | ICD-10-CM

## 2021-10-07 LAB — CBC
HCT: 28.3 % — ABNORMAL LOW (ref 36.0–46.0)
Hemoglobin: 8.6 g/dL — ABNORMAL LOW (ref 12.0–15.0)
MCH: 27.9 pg (ref 26.0–34.0)
MCHC: 30.4 g/dL (ref 30.0–36.0)
MCV: 91.9 fL (ref 80.0–100.0)
Platelets: 253 10*3/uL (ref 150–400)
RBC: 3.08 MIL/uL — ABNORMAL LOW (ref 3.87–5.11)
RDW: 14.9 % (ref 11.5–15.5)
WBC: 8 10*3/uL (ref 4.0–10.5)
nRBC: 0 % (ref 0.0–0.2)

## 2021-10-07 LAB — BASIC METABOLIC PANEL
Anion gap: 5 (ref 5–15)
BUN: 27 mg/dL — ABNORMAL HIGH (ref 8–23)
CO2: 26 mmol/L (ref 22–32)
Calcium: 8.8 mg/dL — ABNORMAL LOW (ref 8.9–10.3)
Chloride: 104 mmol/L (ref 98–111)
Creatinine, Ser: 2.66 mg/dL — ABNORMAL HIGH (ref 0.44–1.00)
GFR, Estimated: 20 mL/min — ABNORMAL LOW (ref >60)
Glucose, Bld: 96 mg/dL (ref 70–99)
Potassium: 5 mmol/L (ref 3.5–5.1)
Sodium: 135 mmol/L (ref 135–145)

## 2021-10-07 LAB — GLUCOSE, CAPILLARY
Glucose-Capillary: 70 mg/dL (ref 70–99)
Glucose-Capillary: 120 mg/dL — ABNORMAL HIGH (ref 70–99)
Glucose-Capillary: 103 mg/dL — ABNORMAL HIGH (ref 70–99)
Glucose-Capillary: 119 mg/dL — ABNORMAL HIGH (ref 70–99)

## 2021-10-07 MED ORDER — INSULIN GLARGINE 100 UNIT/ML ~~LOC~~ SOLN
35.0000 [IU] | Freq: Every day | SUBCUTANEOUS | Status: DC
  Administered 2021-10-07: 35 [IU] via SUBCUTANEOUS
  Filled 2021-10-07 (×2): qty 0.35

## 2021-10-07 MED ORDER — FUROSEMIDE 10 MG/ML IJ SOLN: 80.0000 mg | Freq: Two times a day (BID) | INTRAMUSCULAR | Status: DC

## 2021-10-07 MED ORDER — FUROSEMIDE 10 MG/ML IJ SOLN
80.0000 mg | Freq: Two times a day (BID) | INTRAMUSCULAR | Status: DC
  Administered 2021-10-07 – 2021-10-09 (×5): 80 mg via INTRAVENOUS
  Filled 2021-10-07 (×5): qty 8

## 2021-10-07 NOTE — Plan of Care (Signed)
  Problem: Education: Goal: Knowledge of General Education information will improve Description: Including pain rating scale, medication(s)/side effects and non-pharmacologic comfort measures Outcome: Progressing   Problem: Health Behavior/Discharge Planning: Goal: Ability to manage health-related needs will improve Outcome: Progressing   Problem: Clinical Measurements: Goal: Ability to maintain clinical measurements within normal limits will improve Outcome: Progressing Goal: Cardiovascular complication will be avoided Outcome: Progressing   Problem: Pain Managment: Goal: General experience of comfort will improve Outcome: Progressing   Problem: Skin Integrity: Goal: Risk for impaired skin integrity will decrease Outcome: Progressing

## 2021-10-07 NOTE — Progress Notes (Addendum)
Subjective: No acute overnight events. Patient was seen at bedside during rounds today.   She reports feeling well today, and has noticed improvement in [facility] and leg swelling. Denies CP, abd pain, confusion, and palpitations. She expresses interest in initiating dialysis. No other complaints or concerns at this time.   Pt is updated on the plan for today, and all questions are addressed.   Objective:  Vital signs in last 24 hours: Vitals:   10/07/21 0319 10/07/21 0553 10/07/21 0718 10/07/21 1145  BP: (!) 143/72  (!) 167/80 (!) 129/119  Pulse: 70  86 77  Resp: 12  17 15   Temp: 97.7 F (36.5 C)  97.8 F (36.6 C) 98 F (36.7 C)  TempSrc: Oral  Oral Oral  SpO2: 96%  93% 93%  Weight:  107.1 kg    Height:  5\' 4"  (1.626 m)     Constitutional: awake and alert, well-appearing, in NAD HENT: normocephalic, atraumatic Eyes: conjunctiva non-erythematous, EOMI Cardiovascular: RRR, no m/r/g, 2+ bilateral LE pitting edema up to knees Pulmonary/Chest: breathing comfortably on RA, LCTAB Abdominal: soft, non-tender to palpation, non-distended MSK: normal bulk and tone Neurological: A&O x 3 Skin: warm and dry Psych: normal behavior, normal affect   Assessment/Plan:  Principal Problem:   Acute on chronic diastolic CHF (congestive heart failure) (HCC) Active Problems:   Hypertensive urgency   Type 2 diabetes mellitus with stage 4 chronic kidney disease, with long-term current use of insulin (HCC)   Mixed diabetic hyperlipidemia associated with type 2 diabetes mellitus (HCC)   CKD (chronic kidney disease), stage IV (HCC)   Diabetic ulcer of left foot (HCC)   Acute cardiogenic pulmonary edema (HCC)   Coronary artery disease involving native coronary artery of native heart without angina pectoris   Nancy [name] is a 60 year old female with PMH of HFpEF (05/2021 EF 55-60%), CKD stage 4 (baseline Cr 2.6-2.7), HTN, T2DM (hgb A1C 6.8% 08/2021), HLD, PAD, CAD (LHC 04/2021 with mild  nonobstructive CAD), pulmonary HTN, diabetic polyneuropathy, chronic left heel ulceration, and previous stroke, admitted to the ED for acute on chronic HFpEF exacerbation.   Acute on chronic HFpEF exacerbation in setting of hypertensive urgency  Acute cardiogenic pulmonary edema  10/22 echo EF 50 to 55%, moderate LVH, grade 2 DD, with no significant valvular abnormalities. Subjective improvement in SHOB, orthopnea, and leg swelling. Net output ~1.3 L, but weight up 2 kg, will restart metolazone. 2+ bilateral pitting edema up to knees. Saturating well >98% on RA, and BP 140/80's. - IV lasix 80 bid  - Start metolazone 2.5 qd (30 mins prior to lasix) - coreg 12.5 bid - amlodipine 10 qd - strict I/O and daily weights   Hx CAD  - ASA 81  - Atorvastatin 40 qd   T2DM A1c 6.8 in Sept. CBG 80-115's. States she is eating her meals.  - SSI - D/c mealtime Novolog 4 U tid - Decrease Lantus 35 to 30 U daily at bedtime  CKD 4 Cr 2.8 at baseline (2-3). 07/20/21 renal US negative. Nephrology consulted during prior admission 1 mo ago: no indication for starting dialysis while IP, rec f/u with her nephrologist, [name].  - avoid nephrotoxic agents  - CTM BMP  Gangrenous Diabetic Foot Ulcer, heel of left foot Left and Right Dorsal Foot Skin Lacerations - Continue wound care  - Continue OP follow up with wound care clinic    Best Practice: Diet: Carb-Modified IVF: None,None VTE: Enoxaparin Code: Full   [name], MD  Internal Medicine Resident, PGY-1 [name]  Pager: [phone] After 5pm on weekdays and 1pm on weekends: On Call pager [phone]

## 2021-10-07 NOTE — Discharge Summary (Addendum)
Name: Nancy [name] MRN: [id number] DOB: 28-Aug-1961 60 y.o. PCP: [name], MD  Date of Admission: 10/05/2021 10:11 PM Date of Discharge:  10/10/2021 Attending Physician: Dr. Dareen Piano  DISCHARGE DIAGNOSIS:  Primary Problem: Acute on chronic diastolic CHF (congestive heart failure) [facility])   Hospital Problems: Principal Problem:   Acute on chronic diastolic CHF (congestive heart failure) (HCC) Active Problems:   Hypertensive urgency   Type 2 diabetes mellitus with stage 4 chronic kidney disease, with long-term current use of insulin (HCC)   Mixed diabetic hyperlipidemia associated with type 2 diabetes mellitus (HCC)   CKD (chronic kidney disease), stage IV (HCC)   Diabetic ulcer of left foot (HCC)   Acute cardiogenic pulmonary edema (HCC)   Coronary artery disease involving native coronary artery of native heart without angina pectoris    DISCHARGE MEDICATIONS:   Allergies as of 10/10/2021       Reactions   Elemental Sulfur Hives, Other (See Comments)   PATIENT STATED THIS, MORE THAN LIKELY, SHOULD HAVE BEEN LOGGED AS "SULFA"   Hydralazine Hcl Other (See Comments)   Hair loss   Hydrocodone Itching, Other (See Comments)   Upset stomach   Metformin And Related Nausea And Vomiting, Other (See Comments)   Stomach pains, also   Other Nausea Only, Other (See Comments)   Lettuce- Does not digest this!!   Plaquenil [hydroxychloroquine Sulfate] Hives   Shellfish-derived Products Nausea Only, Other (See Comments)   Caused an upset stomach   Shrimp (diagnostic) Nausea Only, Other (See Comments)   Upset stomach    Sulfa Antibiotics Hives        Medication List     TAKE these medications    acetaminophen 500 MG tablet Commonly known as: TYLENOL Take 500 mg by mouth every 6 (six) hours as needed for moderate pain or headache.   amLODipine 10 MG tablet Commonly known as: NORVASC Take 1 tablet (10 mg total) by mouth daily.   Aspirin Low Dose 81 MG EC  tablet Generic drug: aspirin Take 1 tablet (81 mg total) by mouth daily.   atorvastatin 40 MG tablet Commonly known as: LIPITOR Take 1 tablet (40 mg total) by mouth daily.   calcium acetate 667 MG capsule Commonly known as: PHOSLO Take 1 capsule (667 mg total) by mouth 3 (three) times daily with meals.   carvedilol 12.5 MG tablet Commonly known as: COREG Take 1 tablet (12.5 mg total) by mouth 2 (two) times daily with a meal.   DULoxetine 20 MG capsule Commonly known as: CYMBALTA TAKE 1 CAPSULE (20 MG TOTAL) BY MOUTH DAILY.   FeroSul 325 (65 FE) MG tablet Generic drug: ferrous sulfate Take 1 tablet (325 mg total) by mouth daily with breakfast.   glucose blood test strip Use as instructedCheck blood sugar three times daily. E11.40   insulin glargine 100 UNIT/ML Solostar Pen Commonly known as: LANTUS Inject 30 Units into the skin at bedtime. What changed: how much to take   insulin lispro 100 UNIT/ML KwikPen Commonly known as: HumaLOG KwikPen Inject 4 units before breakfast and dinner. What changed:  how much to take how to take this when to take this additional instructions   Insulin Pen Needle 32G X 4 MM Misc Use to inject insulin as directed.   OneTouch Verio w/Device Kit Check blood sugar three times daily. E11.40 What changed: Another medication with the same name was removed. Continue taking this medication, and follow the directions you see here.   risperiDONE 3 MG  tablet Commonly known as: RISPERDAL TAKE 1 TABLET (3 MG TOTAL) BY MOUTH AT BEDTIME. What changed:  how much to take how to take this when to take this   senna-docusate 8.6-50 MG tablet Commonly known as: Senokot-S Take 2 tablets by mouth at bedtime.   torsemide 20 MG tablet Commonly known as: DEMADEX Take 3 tablets (60 mg total) by mouth daily. Start taking on: October 11, 2021 What changed: how much to take               Discharge Care Instructions  (From admission, onward)            Start     Ordered   10/10/21 0000  Discharge wound care:       Comments: Wound care to bilateral LEs:  Wash bilateral LEs with soap and water. Pat dry.  Apply a generous layer of house moisturizing cream (Sween 24, pink topped tube) to intact areas of the LEs and feet, do not apply over ulcerated areas. Apply xeroform gauze Kellie Simmering # 294) to open areas at the anterior aspects of feet. Paint the left heel with betadine swabstick and allow to air-dry. When dry, cover wounds with dry gauze or ABD pads and secure with Kerlix roll gauze wrapped from just below toes to just below knees/paper tape. Place feet into [employer].   10/10/21 1039            DISPOSITION AND FOLLOW-UP:  Ms.Mckinley H Mazzola was discharged from [facility] in stable condition. At the hospital follow up visit please address:  Follow-up Recommendations: Consults: none  Labs: Basic Metabolic Profile in 1-2 weeks for Cr and electrolytes  Studies: none  Medications: Torsemide 60 instead of 40 every day. Lantus 30 instead of 55 units as CBG's low while in hospital. Continue taking your 4 units of Humalog with meals.   Follow-up Appointments:  Follow-up Information     [facility]. Go in 4 day(s).   Specialty: Cardiology Why: 10/10/21 at 10:30 am for your heart failure appt Contact information: [address] [place]        [name], MD. Schedule an appointment as soon as possible for a visit in 2 day(s).   Specialty: Internal Medicine Why: PLEASE schedule for an appointment with your doctor THIS WEEK: for a post hospitalization PCP visit to discuss any changes that need to be made to your health care plan. VERY important! Contact information: [place] [address] [phone]         Madelon Lips, MD. Schedule an appointment as soon as  possible for a visit in 1 week(s).   Specialty: Nephrology Why: Please schedule an appointment with [name] in the next week to determine next steps for starting dialysis. Contact information: [place] [address] [phone]                 HOSPITAL COURSE:  Patient Summary: Nancy [name] is a 60 year old female with PMH of HFpEF (05/2021 EF 55-60%), CKD stage 4 (baseline Cr 2.6-2.7), HTN, T2DM (hgb A1C 6.8% 08/2021), HLD, PAD, CAD (LHC 04/2021 with mild nonobstructive CAD), pulmonary HTN, diabetic polyneuropathy, chronic left heel ulceration, and previous stroke, admitted for acute on chronic HFpEF exacerbation.   Acute on chronic HFpEF exacerbation in setting of hypertensive urgency  Acute cardiogenic pulmonary edema  EMS noted patient to be satting 70% on room  air and was started on CPAP.  In the ED she was noted to be in continued respiratory distress and was started on BiPAP and was admitted to the hospital for an acute CHF exacerbation. CXR showed pulmonary vascular congestion without focal edema. On exam, patient noted to have 2+ bilateral lower extremity pitting edema up to the knees and mild increased effort with breathing but no wheezing or rales noted on exam.  Etiology likely 2/2 to medication noncompliance and increase in fluid intake. Strict I's and O's, daily weights, BMP monitored.  IV lasix 60 bid started, with notable improvement in respiratory status and edema. BP improvement as well from SBP 190s to 120-150's. Echo on 10/22 with a EF of 50 to 55%, moderate LVH, grade 2 diastolic dysfunction. No significant valvular abnormalities. Increased IV lasix to 80 bid and continued monitoring. Also tried metolazone 2.5 with not much diuresis noted. Diuresed 2 L with 2.5 days of lasix, and improved to 3.5 net liters after switching to torsemide 60. Pt reporting significant symptomatic improvement, and was d/c on 10/25 with home health PT and OT.  Continued Coreg  12.5 mg twice daily and amlodipine 10 mg qd.   Hx CAD  Chest pain free.  -- ASA 81 mg - Atorvastatin 40 mg daily   T2DM A1c 6.8 in September.  CBG's range from 75-115's. - SSI and Lantus 30 U while in hospital.  - On d/c, decreased Lantus 55 to 30 units to prevent hypoglycemia, may adjust as necessary depending on blood glucose.    CKD 4 Cr bump to 3.12 at baseline (2-3). Nephrology consulted during prior admission 1 mo ago: no indication for starting dialysis while IP, rec f/u with her nephrologist, [name].  - avoid nephrotoxic agents  - CTM BMP - OP nephrology appt with [name] to discuss dialysis initation   Gangrenous pressure ulcers of bilateral heels Left and Right Dorsal Foot Skin Lacerations PAD -Continue wound care -Continue outpatient follow-up with wound care clinic   DISCHARGE INSTRUCTIONS:   Discharge Instructions     (HEART FAILURE PATIENTS) Call MD:  Anytime you have any of the following symptoms: 1) 3 pound weight gain in 24 hours or 5 pounds in 1 week 2) shortness of breath, with or without a dry hacking cough 3) swelling in the hands, feet or stomach 4) if you have to sleep on extra pillows at night in order to breathe.   Complete by: As directed    Call MD for:  difficulty breathing, headache or visual disturbances   Complete by: As directed    Call MD for:  extreme fatigue   Complete by: As directed    Call MD for:  persistant dizziness or light-headedness   Complete by: As directed    Call MD for:  persistant nausea and vomiting   Complete by: As directed    Call MD for:  redness, tenderness, or signs of infection (pain, swelling, redness, odor or green/yellow discharge around incision site)   Complete by: As directed    Call MD for:  severe uncontrolled pain   Complete by: As directed    Call MD for:  temperature >100.4   Complete by: As directed    Diet - low sodium heart healthy   Complete by: As directed    Discharge wound care:   Complete by:  As directed    Wound care to bilateral LEs:  Wash bilateral LEs with soap and water. Pat dry.  Apply a generous  layer of house moisturizing cream (Sween 24, pink topped tube) to intact areas of the LEs and feet, do not apply over ulcerated areas. Apply xeroform gauze Kellie Simmering # 294) to open areas at the anterior aspects of feet. Paint the left heel with betadine swabstick and allow to air-dry. When dry, cover wounds with dry gauze or ABD pads and secure with Kerlix roll gauze wrapped from just below toes to just below knees/paper tape. Place feet into [employer].   Face-to-face encounter (required for Medicare/Medicaid patients)   Complete by: As directed    I [name] certify that this patient is under my care and that I, or a nurse practitioner or physician's assistant working with me, had a face-to-face encounter that meets the physician face-to-face encounter requirements with this patient on 10/10/2021. The encounter with the patient was in whole, or in part for the following medical condition(s) which is the primary reason for home health care (List medical condition): shortness of breath, instability   The encounter with the patient was in whole, or in part, for the following medical condition, which is the primary reason for home health care: shortness of breath   I certify that, based on my findings, the following services are medically necessary home health services: Physical therapy   Reason for Medically Necessary Home Health Services: Therapy- Personnel officer, Public librarian   My clinical findings support the need for the above services: Shortness of breath with activity   Further, I certify that my clinical findings support that this patient is homebound due to:  Unsafe ambulation due to balance issues Shortness of Breath with activity     Home Health   Complete by: As directed    To provide the following care/treatments:  PT OT     Increase activity slowly    Complete by: As directed        SUBJECTIVE:  No overnight events.    Patient was seen at bedside during rounds today.    She reports feeling well today. Her SHOB, LE swelling, and HA have all been improving. No other complaints or concerns at this time. She continues to diurese well and made clinical improvement, and is stable for d/c.    Pt is updated on the plan for today, and all questions are addressed.   Discharge Vitals:   BP (!) 165/70 (BP Location: Right Arm)   Pulse 76   Temp 97.6 F (36.4 C) (Axillary)   Resp 17   Ht _0  (1.626 m)   Wt 105.3 kg   SpO2 96%   BMI 39.85 kg/m   OBJECTIVE:  Constitutional: awake and alert, well-appearing, in NAD HENT: normocephalic, atraumatic Eyes: conjunctiva non-erythematous, EOMI Cardiovascular: RRR, no m/r/g, 1+ bilateral LE pitting edema up to knees Pulmonary/Chest: breathing comfortably on RA, LCTAB Abdominal: soft, non-tender to palpation, non-distended MSK: normal bulk and tone Neurological: A&O x 3 Skin: warm and dry Psych: normal behavior, normal affect  Pertinent Labs, Studies, and Procedures:  CBC Latest Ref Rng & Units 10/10/2021 10/09/2021 10/08/2021  WBC 4.0 - 10.5 K/uL 7.8 7.6 8.5  Hemoglobin 12.0 - 15.0 g/dL 8.8(L) 8.0(L) 8.0(L)  Hematocrit 36.0 - 46.0 % 28.2(L) 26.0(L) 26.3(L)  Platelets 150 - 400 K/uL 263 259 226    CMP Latest Ref Rng & Units 10/10/2021 10/09/2021 10/08/2021  Glucose 70 - 99 mg/dL 80 121(H) 136(H)  BUN 8 - 23 mg/dL 46(H) 40(H) 32(H)  Creatinine 0.44 - 1.00 mg/dL 3.12(H) 3.01(H) 2.82(H)  Sodium 135 - 145 mmol/L 131(L) 133(L) 131(L)  Potassium 3.5 - 5.1 mmol/L 5.0 5.2(H) 5.1  Chloride 98 - 111 mmol/L 97(L) 100 100  CO2 22 - 32 mmol/L _0 Calcium 8.9 - 10.3 mg/dL 8.9 8.7(L) 8.5(L)  Total Protein 6.5 - 8.1 g/dL - - -  Total Bilirubin 0.3 - 1.2 mg/dL - - -  Alkaline Phos 38 - 126 U/L - - -  AST 15 - 41 U/L - - -  ALT 0 - 44 U/L - - -    DG Chest Portable 1 View  Result Date:  10/05/2021 CLINICAL DATA:  Shortness of breath EXAM: PORTABLE CHEST 1 VIEW COMPARISON:  08/12/2021 FINDINGS: Cardiac shadow is mildly prominent but stable. Vascular congestion is seen bilaterally without focal edema. Small left pleural effusion is noted. IMPRESSION: Changes of vascular congestion without significant edema. Small left pleural effusion. Electronically Signed   By: Inez Catalina M.D.   On: 10/05/2021 23:22   ECHOCARDIOGRAM COMPLETE  Result Date: 10/06/2021    ECHOCARDIOGRAM REPORT   Patient Name:   KAYDI KLEY Date of Exam: 10/06/2021 Medical Rec #:  [id number]           Height:       64.0 in Accession #:    [id number]          Weight:       229.8 lb Date of Birth:  10-Jul-1961           BSA:          2.075 m Patient Age:    63 years            BP:           137/76 mmHg Patient Gender: F                   HR:           69 bpm. Exam Location:  Inpatient Procedure: 2D Echo, Cardiac Doppler and Color Doppler Indications:    CHF  History:        Patient has prior history of Echocardiogram examinations. CHF;                 Risk Factors:Dyslipidemia, Diabetes and Hypertension.  Sonographer:    Danne Baxter RDCS, FE, PE Referring Phys: [id number] NISCHAL NARENDRA IMPRESSIONS  1. LV function low normal to mildly reduced.  2. Left ventricular ejection fraction, by estimation, is 50 to 55%. The left ventricle has low normal function. The left ventricle has no regional wall motion abnormalities. There is moderate left ventricular hypertrophy. Left ventricular diastolic parameters are consistent with Grade II diastolic dysfunction (pseudonormalization). Elevated left atrial pressure.  3. Right ventricular systolic function is normal. The right ventricular size is normal.  4. Left atrial size was mildly dilated.  5. The mitral valve is normal in structure. No evidence of mitral valve regurgitation. No evidence of mitral stenosis.  6. The aortic valve is tricuspid. Aortic valve regurgitation is not  visualized. No aortic stenosis is present.  7. The inferior vena cava is normal in size with greater than 50% respiratory variability, suggesting right atrial pressure of 3 mmHg. FINDINGS  Left Ventricle: Left ventricular ejection fraction, by estimation, is 50 to 55%. The left ventricle has low normal function. The left ventricle has no regional wall motion abnormalities. The left ventricular internal cavity size was normal in size. There is moderate left ventricular hypertrophy. Left ventricular diastolic parameters are consistent with  Grade II diastolic dysfunction (pseudonormalization). Elevated left atrial pressure. Right Ventricle: The right ventricular size is normal. Right ventricular systolic function is normal. Left Atrium: Left atrial size was mildly dilated. Right Atrium: Right atrial size was normal in size. Pericardium: There is no evidence of pericardial effusion. Mitral Valve: The mitral valve is normal in structure. No evidence of mitral valve regurgitation. No evidence of mitral valve stenosis. Tricuspid Valve: The tricuspid valve is normal in structure. Tricuspid valve regurgitation is trivial. No evidence of tricuspid stenosis. Aortic Valve: The aortic valve is tricuspid. Aortic valve regurgitation is not visualized. No aortic stenosis is present. Pulmonic Valve: The pulmonic valve was not well visualized. Pulmonic valve regurgitation is not visualized. No evidence of pulmonic stenosis. Aorta: The aortic root is normal in size and structure. Venous: The inferior vena cava is normal in size with greater than 50% respiratory variability, suggesting right atrial pressure of 3 mmHg. IAS/Shunts: No atrial level shunt detected by color flow Doppler. Additional Comments: LV function low normal to mildly reduced.  LEFT VENTRICLE PLAX 2D LVIDd:         4.50 cm   Diastology LVIDs:         3.20 cm   LV e' medial:    4.78 cm/s LV PW:         0.90 cm   LV E/e' medial:  18.1 LV IVS:        1.00 cm   LV e'  lateral:   5.84 cm/s LVOT diam:     1.90 cm   LV E/e' lateral: 14.8 LV SV:         62 LV SV Index:   30 LVOT Area:     2.84 cm  RIGHT VENTRICLE RV S prime:     10.90 cm/s TAPSE (M-mode): 1.8 cm LEFT ATRIUM             Index        RIGHT ATRIUM           Index LA diam:        4.20 cm 2.02 cm/m   RA Area:     18.30 cm LA Vol (A2C):   64.9 ml 31.27 ml/m  RA Volume:   54.30 ml  26.16 ml/m LA Vol (A4C):   88.3 ml 42.55 ml/m LA Biplane Vol: 80.7 ml 38.89 ml/m  AORTIC VALVE LVOT Vmax:   92.00 cm/s LVOT Vmean:  62.400 cm/s LVOT VTI:    0.217 m  AORTA Ao Root diam: 2.60 cm Ao Asc diam:  2.60 cm MITRAL VALVE               TRICUSPID VALVE MV Area (PHT): 3.89 cm    TR Peak grad:   10.2 mmHg MV Decel Time: 195 msec    TR Vmax:        160.00 cm/s MV E velocity: 86.50 cm/s MV A velocity: 77.60 cm/s  SHUNTS MV E/A ratio:  1.11        Systemic VTI:  0.22 m                            Systemic Diam: 1.90 cm Kirk Ruths MD Electronically signed by Kirk Ruths MD Signature Date/Time: 10/06/2021/4:31:12 PM    Final       [name], MD Internal Medicine Resident, PGY-1 [name]  Pager: [phone] 11:12 AM, 10/10/2021

## 2021-10-07 NOTE — Hospital Course (Addendum)
Nancy [name] is a 60 year old female with PMH of HFpEF (05/2021 EF 55-60%), CKD stage 4 (baseline Cr 2.6-2.7), HTN, T2DM (hgb A1C 6.8% 08/2021), HLD, PAD, CAD (LHC 04/2021 with mild nonobstructive CAD), pulmonary HTN, diabetic polyneuropathy, chronic left heel ulceration, and previous stroke, admitted for acute on chronic HFpEF exacerbation.   Acute on chronic HFpEF exacerbation in setting of hypertensive urgency  Acute cardiogenic pulmonary edema  EMS noted patient to be satting 70% on room air and was started on CPAP.  In the ED she was noted to be in continued respiratory distress and was started on BiPAP and was admitted to the hospital for an acute CHF exacerbation. CXR showed pulmonary vascular congestion without focal edema. On exam, patient noted to have 2+ bilateral lower extremity pitting edema up to the knees and mild increased effort with breathing but no wheezing or rales noted on exam.  Etiology likely 2/2 to medication noncompliance and increase in fluid intake. Strict I's and O's, daily weights, BMP monitored.  IV lasix 60 bid started, with notable improvement in respiratory status and edema. BP improvement as well from SBP 190s to 120-150's. Echo on 10/22 with a EF of 50 to 55%, moderate LVH, grade 2 diastolic dysfunction. No significant valvular abnormalities. Increased IV lasix to 80 bid and continued monitoring. Also tried metolazone 2.5 with not much diuresis noted. Diuresed 2 L with 2.5 days of lasix, and improved to 3.5 net liters after switching to torsemide 60. Pt reporting significant symptomatic improvement, and was d/c on 10/25 with home health PT and OT.  Continued Coreg 12.5 mg twice daily and amlodipine 10 mg qd.   Hx CAD  Chest pain free.  -- ASA 81 mg - Atorvastatin 40 mg daily   T2DM A1c 6.8 in September.  CBG's range from 75-115's. - SSI and Lantus 30 U while in hospital.  - On d/c, decreased Lantus 55 to 30 units to prevent hypoglycemia, may adjust as  necessary depending on blood glucose.    CKD 4 Cr bump to 3.12 at baseline (2-3). Nephrology consulted during prior admission 1 mo ago: no indication for starting dialysis while IP, rec f/u with her nephrologist, [name].  - avoid nephrotoxic agents  - CTM BMP - OP nephrology appt with [name] to discuss dialysis initation   Gangrenous pressure ulcers of bilateral heels Left and Right Dorsal Foot Skin Lacerations PAD -Continue wound care -Continue outpatient follow-up with wound care clinic

## 2021-10-07 NOTE — Progress Notes (Addendum)
Subjective: No acute overnight events. Patient was seen at bedside during rounds today.   Feels like SHOB has improved. Denies CP. Endorses a throbbing headache for which she uses Tylenol, but this has not alleviated her pain. Her HA's started early this morning. She does state that her swelling has improved. Did not urinate much overnight. No other complaints or concerns today.  Pt is updated on the plan for today, and all questions were addressed.   Objective:  Vital signs in last 24 hours: Vitals:   10/06/21 1946 10/07/21 0048 10/07/21 0319 10/07/21 0553  BP: (!) 141/91 (!) 147/78 (!) 143/72   Pulse: 62 66 70   Resp: 14 12 12    Temp: (!) 97.5 F (36.4 C) 97.6 F (36.4 C) 97.7 F (36.5 C)   TempSrc: Axillary Oral Oral   SpO2: 96% 92% 96%   Weight:    107.1 kg  Height:    5\' 4"  (1.626 m)    Constitutional: awake and alert, well-appearing, in NAD HENT: normocephalic, atraumatic Eyes: conjunctiva non-erythematous, EOMI Cardiovascular: RRR, no m/r/g, continues to have significant bilateral lower extremity pitting edema 3+  Pulmonary/Chest: Breathing comfortably on room air.  Lungs clear to auscultation bilaterally. Abdominal: soft, non-tender to palpation, non-distended MSK: normal bulk and tone, left foot in offloading boot. Neurological: A&O x 3 Skin: warm and dry Psych: normal behavior, normal affect  CBC Latest Ref Rng & Units 10/07/2021 10/06/2021 10/05/2021  WBC 4.0 - 10.5 K/uL 8.0 10.8(H) -  Hemoglobin 12.0 - 15.0 g/dL 8.6(L) 9.8(L) 10.9(L)  Hematocrit 36.0 - 46.0 % 28.3(L) 32.1(L) 32.0(L)  Platelets 150 - 400 K/uL 253 294 -   BMP Latest Ref Rng & Units 10/07/2021 10/06/2021 10/05/2021  Glucose 70 - 99 mg/dL 96 143(H) -  BUN 8 - 23 mg/dL 27(H) 24(H) -  Creatinine 0.44 - 1.00 mg/dL 2.66(H) 2.55(H) -  BUN/Creat Ratio 12 - 28 - - -  Sodium 135 - 145 mmol/L 135 138 138  Potassium 3.5 - 5.1 mmol/L 5.0 4.1 4.3  Chloride 98 - 111 mmol/L 104 104 -  CO2 22 - 32  mmol/L 26 26 -  Calcium 8.9 - 10.3 mg/dL 8.8(L) 8.9 -   Assessment/Plan:  Principal Problem:   Acute on chronic diastolic CHF (congestive heart failure) (HCC) Active Problems:   Hypertensive urgency   Type 2 diabetes mellitus with stage 4 chronic kidney disease, with long-term current use of insulin (HCC)   Mixed diabetic hyperlipidemia associated with type 2 diabetes mellitus (HCC)   CKD (chronic kidney disease), stage IV (HCC)   Diabetic ulcer of left foot (HCC)   Acute cardiogenic pulmonary edema (HCC)   Coronary artery disease involving native coronary artery of native heart without angina pectoris   Nancy [name] is a 60 year old female with PMH of HFpEF (05/2021 EF 55-60%), CKD stage 4 (baseline Cr 2.6-2.7), HTN, T2DM (hgb A1C 6.8% 08/2021), HLD, PAD, CAD (LHC 04/2021 with mild nonobstructive CAD), pulmonary HTN, diabetic polyneuropathy, chronic left heel ulceration, and previous stroke, admitted to the ED for acute on chronic HFpEF exacerbation.  Acute on chronic HFpEF exacerbation in setting of hypertensive urgency  Acute cardiogenic pulmonary edema  Blood pressure seems to have improved overnight.  However in room this morning BPs in 160s over 90s.  Patient with improved respiratory status today.  She is saturating well on room air.  Continues to have significant bilateral lower extremity edema.  Net 200 mL urine output overnight.  Echo on 10/22 with a EF  of 50 to 55%, moderate LVH, grade 2 diastolic dysfunction.  No significant valvular abnormalities.  Does not appear that she had much diuresis overnight -Increase Lasix IV 80 mg twice daily -Continue Coreg 12.5 mg twice daily -Continue amlodipine 10 mg daily, consider addition of blood pressure agent if BP remains high after diuresis. -Strict I's and O's, daily weights, BMP  Hx CAD  - ASA 81 mg - Atorvastatin 40 mg daily  T2DM A1c 6.8 in September.  Glucose of 70 this morning.  She was asymptomatic and eating breakfast in  the room today we will decrease her Lantus to 35 units. - SSI - Novolog 4units tid - Lantus 35 U daily at bedtime  CKD 4 Stable at baseline. -Monitor BMP, avoid nephrotoxins.  Gangrenous pressure ulcers of bilateral heels Left and Right Dorsal Foot Skin Lacerations PAD -Continue wound care -Continue outpatient follow-up with wound care clinic   Best Practice: Diet: Carb-Modified IVF: None,None VTE: Enoxaparin Code: Full  Iona Beard, MD Internal Medicine Resident, PGY-2 [name]  Pager: [phone] After 5pm on weekdays and 1pm on weekends: On Call pager [phone]

## 2021-10-07 NOTE — Discharge Instructions (Addendum)
Nancy [name] you were admitted to [facility] Internal Medicine Service because of shortness of breath and leg swelling. We did several labs and tests to rule out many life threatening things, as well as to determine the cause of your symptoms. We discovered that this is likely from your heart failure exacerbation because of not taking your medications as directed and by drinking too much fluids. Also you had a high blood pressure when you came in, and this is also a possible reason for these symptoms.   We treated you by giving you water pills and medications for your high blood pressure. You are improved significantly with this treatment, and we continued to monitor you until you were stable enough for discharge.   PLEASE continue to take your medications as prescribed by your doctor, with some notable changes including: Take torsemide 60 instead of 40 every day. Take only 30 units of Lantus instead of 55 units. Continue taking your 4 units of Humalog with meals.   PLEASE do not miss any doses of your medications as it is very important in ensuring you continue to feel better and remain stable. ALSO please continue to limit your water intake to about 3- 16 oz water bottles   You need to follow up with your primary care doctor by calling their office within the next week to create an appointment to discuss the events of this hospitalization, and to determine the best management plan for you and your conditions, to prevent you from having to return to the hospital. ADDITIONALLY, please go to any other appointments made for you in this discharge information, such as the Heart Failure clinic.   RETURN to the ED if you have similar or worsening symptoms, and do not feel like your normal self.

## 2021-10-08 LAB — CBC
HCT: 26.3 % — ABNORMAL LOW (ref 36.0–46.0)
Hemoglobin: 8 g/dL — ABNORMAL LOW (ref 12.0–15.0)
MCH: 28 pg (ref 26.0–34.0)
MCHC: 30.4 g/dL (ref 30.0–36.0)
MCV: 92 fL (ref 80.0–100.0)
Platelets: 226 10*3/uL (ref 150–400)
RBC: 2.86 MIL/uL — ABNORMAL LOW (ref 3.87–5.11)
RDW: 14.6 % (ref 11.5–15.5)
WBC: 8.5 10*3/uL (ref 4.0–10.5)
nRBC: 0 % (ref 0.0–0.2)

## 2021-10-08 LAB — GLUCOSE, CAPILLARY
Glucose-Capillary: 130 mg/dL — ABNORMAL HIGH (ref 70–99)
Glucose-Capillary: 86 mg/dL (ref 70–99)
Glucose-Capillary: 88 mg/dL (ref 70–99)
Glucose-Capillary: 107 mg/dL — ABNORMAL HIGH (ref 70–99)
Glucose-Capillary: 132 mg/dL — ABNORMAL HIGH (ref 70–99)

## 2021-10-08 LAB — BASIC METABOLIC PANEL
Anion gap: 6 (ref 5–15)
BUN: 32 mg/dL — ABNORMAL HIGH (ref 8–23)
CO2: 25 mmol/L (ref 22–32)
Calcium: 8.5 mg/dL — ABNORMAL LOW (ref 8.9–10.3)
Chloride: 100 mmol/L (ref 98–111)
Creatinine, Ser: 2.82 mg/dL — ABNORMAL HIGH (ref 0.44–1.00)
GFR, Estimated: 18 mL/min — ABNORMAL LOW (ref >60)
Glucose, Bld: 136 mg/dL — ABNORMAL HIGH (ref 70–99)
Potassium: 5.1 mmol/L (ref 3.5–5.1)
Sodium: 131 mmol/L — ABNORMAL LOW (ref 135–145)

## 2021-10-08 LAB — MAGNESIUM: Magnesium: 2.1 mg/dL (ref 1.7–2.4)

## 2021-10-08 MED ORDER — INSULIN GLARGINE 100 UNIT/ML ~~LOC~~ SOLN
30.0000 [IU] | Freq: Every day | SUBCUTANEOUS | Status: DC
  Administered 2021-10-08 – 2021-10-09 (×2): 30 [IU] via SUBCUTANEOUS
  Filled 2021-10-08 (×2): qty 0.3

## 2021-10-08 MED ORDER — ACETAMINOPHEN 325 MG PO TABS: 650.0000 mg | ORAL_TABLET | ORAL | Status: DC | PRN

## 2021-10-08 MED ORDER — ACETAMINOPHEN 650 MG RE SUPP: 650.0000 mg | Freq: Four times a day (QID) | RECTAL | Status: DC | PRN

## 2021-10-08 MED ORDER — SENNOSIDES-DOCUSATE SODIUM 8.6-50 MG PO TABS
1.0000 | ORAL_TABLET | Freq: Every day | ORAL | Status: DC | PRN
  Administered 2021-10-08: 1 via ORAL
  Filled 2021-10-08: qty 1

## 2021-10-08 MED ORDER — OXYCODONE HCL 5 MG PO TABS
5.0000 mg | ORAL_TABLET | Freq: Once | ORAL | Status: AC
  Administered 2021-10-08: 5 mg via ORAL
  Filled 2021-10-08: qty 1

## 2021-10-08 MED ORDER — METOLAZONE 2.5 MG PO TABS
2.5000 mg | ORAL_TABLET | Freq: Every day | ORAL | Status: DC
  Administered 2021-10-08 – 2021-10-09 (×2): 2.5 mg via ORAL
  Filled 2021-10-08: qty 1

## 2021-10-08 NOTE — Progress Notes (Addendum)
Subjective: ON: Nurse paged for increased wheezing and SHOB, RT paged, and CXR ordered, which was negative.   Patient was seen at bedside during rounds today.   She reports feeling well today. Her SHOB, LE swelling, and HA have all been improving. Her HA was 10/10 yesterday and is now a 7/10. She endorses drinking about 5 cups of water per day. She did have some wheezing overnight, has since improved. No other complaints or concerns at this time.   Pt is updated on the plan for today, and all questions are addressed.   Objective:  Vital signs in last 24 hours: Vitals:   10/07/21 1645 10/07/21 2004 10/08/21 0008 10/08/21 0442  BP: (!) 148/95 (!) 153/62 (!) 153/69 (!) 145/80  Pulse: 73 73 73 67  Resp: 17 16 17 12   Temp: 98 F (36.7 C) 97.7 F (36.5 C) 97.7 F (36.5 C) 97.7 F (36.5 C)  TempSrc: Oral Oral Oral Oral  SpO2: 98% 94% 100%   Weight:    109.4 kg  Height:       Constitutional: awake and alert, well-appearing, in NAD HENT: normocephalic, atraumatic Eyes: conjunctiva non-erythematous, EOMI Cardiovascular: RRR, no m/r/g, 2+ bilateral LE pitting edema up to knees which appears improved  Pulmonary/Chest: breathing comfortably on RA, LCTAB Abdominal: soft, non-tender to palpation, non-distended MSK: normal bulk and tone Neurological: A&O x 3 Skin: warm and dry Psych: normal behavior, normal affect   Assessment/Plan:  Principal Problem:   Acute on chronic diastolic CHF (congestive heart failure) (HCC) Active Problems:   Hypertensive urgency   Type 2 diabetes mellitus with stage 4 chronic kidney disease, with long-term current use of insulin (HCC)   Mixed diabetic hyperlipidemia associated with type 2 diabetes mellitus (HCC)   CKD (chronic kidney disease), stage IV (HCC)   Diabetic ulcer of left foot (HCC)   Acute cardiogenic pulmonary edema (HCC)   Coronary artery disease involving native coronary artery of native heart without angina pectoris   CORNELIUS SCHUITEMA is a 60 year old female with PMH of HFpEF (05/2021 EF 55-60%), CKD stage 4 (baseline Cr 2.6-2.7), HTN, T2DM (hgb A1C 6.8% 08/2021), HLD, PAD, CAD (LHC 04/2021 with mild nonobstructive CAD), pulmonary HTN, diabetic polyneuropathy, chronic left heel ulceration, and previous stroke, admitted to the ED for acute on chronic HFpEF exacerbation.  Acute on chronic HFpEF exacerbation in setting of hypertensive urgency  Acute cardiogenic pulmonary edema  10/22 echo EF 50-55%, moderate LVH, grade 2 DD, with no significant valvular abnormalities. Subjective improvement in SHOB, orthopnea, and leg swelling. 24 hr output 900 mL, net output ~1.4 L; weight up 5 kg, but likely in setting of weights being taken +/- prevalon boots, as she continues to make clinical improvement on metolazone and lasix 80 bid and CXR mostly clear. Improved 2+ bilateral pitting edema up to knees. Saturating well >98% on RA, and BP 140/80's. - Transition IV lasix 80 bid to torsemide 60 mg - d/c metolazone 2.5 qd - coreg 12.5 bid - amlodipine 10 qd - strict I/O and daily weights   Hx CAD  - ASA 81  - Atorvastatin 40 qd   T2DM A1c 6.8 in Sept. CBG 100-120s  - SSI - Lantus 30 U daily at bedtime  CKD 4 Cr bump to 3.0 at baseline (2-3). Nephrology consulted during prior admission 1 mo ago: no indication for starting dialysis while IP, rec f/u with her nephrologist, [name].  - avoid nephrotoxic agents  - CTM BMP  Gangrenous Diabetic Foot Ulcer,  heel of left foot Left and Right Dorsal Foot Skin Lacerations - Continue wound care  - Continue OP follow up with wound care clinic    Best Practice: Diet: Carb-Modified IVF: None,None VTE: Enoxaparin Code: Full   [name], MD  Internal Medicine Resident, PGY-1 [name]  Pager: [phone] After 5pm on weekdays and 1pm on weekends: On Call pager [phone]

## 2021-10-09 ENCOUNTER — Inpatient Hospital Stay (HOSPITAL_COMMUNITY): Payer: Medicaid Other

## 2021-10-09 LAB — CBC
HCT: 26 % — ABNORMAL LOW (ref 36.0–46.0)
Hemoglobin: 8 g/dL — ABNORMAL LOW (ref 12.0–15.0)
MCH: 28.4 pg (ref 26.0–34.0)
MCHC: 30.8 g/dL (ref 30.0–36.0)
MCV: 92.2 fL (ref 80.0–100.0)
Platelets: 259 10*3/uL (ref 150–400)
RBC: 2.82 MIL/uL — ABNORMAL LOW (ref 3.87–5.11)
RDW: 14.8 % (ref 11.5–15.5)
WBC: 7.6 10*3/uL (ref 4.0–10.5)
nRBC: 0 % (ref 0.0–0.2)

## 2021-10-09 LAB — GLUCOSE, CAPILLARY
Glucose-Capillary: 81 mg/dL (ref 70–99)
Glucose-Capillary: 115 mg/dL — ABNORMAL HIGH (ref 70–99)
Glucose-Capillary: 140 mg/dL — ABNORMAL HIGH (ref 70–99)

## 2021-10-09 LAB — BASIC METABOLIC PANEL
Anion gap: 4 — ABNORMAL LOW (ref 5–15)
BUN: 40 mg/dL — ABNORMAL HIGH (ref 8–23)
CO2: 29 mmol/L (ref 22–32)
Calcium: 8.7 mg/dL — ABNORMAL LOW (ref 8.9–10.3)
Chloride: 100 mmol/L (ref 98–111)
Creatinine, Ser: 3.01 mg/dL — ABNORMAL HIGH (ref 0.44–1.00)
GFR, Estimated: 17 mL/min — ABNORMAL LOW (ref >60)
Glucose, Bld: 121 mg/dL — ABNORMAL HIGH (ref 70–99)
Potassium: 5.2 mmol/L — ABNORMAL HIGH (ref 3.5–5.1)
Sodium: 133 mmol/L — ABNORMAL LOW (ref 135–145)

## 2021-10-09 MED ORDER — TORSEMIDE 20 MG PO TABS
60.0000 mg | ORAL_TABLET | Freq: Every day | ORAL | Status: DC
  Administered 2021-10-09 – 2021-10-10 (×2): 60 mg via ORAL
  Filled 2021-10-09 (×2): qty 3

## 2021-10-09 MED ORDER — ALBUTEROL SULFATE (2.5 MG/3ML) 0.083% IN NEBU
2.5000 mg | INHALATION_SOLUTION | RESPIRATORY_TRACT | Status: DC | PRN
  Administered 2021-10-09: 2.5 mg via RESPIRATORY_TRACT

## 2021-10-09 MED ORDER — ALBUTEROL SULFATE (2.5 MG/3ML) 0.083% IN NEBU
INHALATION_SOLUTION | RESPIRATORY_TRACT | Status: AC
  Filled 2021-10-09: qty 3

## 2021-10-09 NOTE — Progress Notes (Signed)
Subjective: No overnight events.   Patient was seen at bedside during rounds today.   She reports feeling well today. Her SHOB, LE swelling, and HA have all been improving. No other complaints or concerns at this time. She continues to diurese well and made clinical improvement, and is stable for d/c later today pending PT eval.  Pt is updated on the plan for today, and all questions are addressed.   Objective:  Vital signs in last 24 hours: Vitals:   10/09/21 0427 10/09/21 0522 10/09/21 0741 10/09/21 0805  BP: (!) 147/75   (!) 144/77  Pulse: 72  75   Resp: 11   16  Temp: 98 F (36.7 C)   97.9 F (36.6 C)  TempSrc: Oral     SpO2: 98% 99% 96%   Weight: 112.1 kg     Height:       Constitutional: awake and alert, well-appearing, in NAD HENT: normocephalic, atraumatic Eyes: conjunctiva non-erythematous, EOMI Cardiovascular: RRR, no m/r/g, 1+ bilateral LE pitting edema up to knees which appears improved  Pulmonary/Chest: breathing comfortably on RA, LCTAB Abdominal: soft, non-tender to palpation, non-distended MSK: normal bulk and tone Neurological: A&O x 3 Skin: warm and dry Psych: normal behavior, normal affect   Assessment/Plan:  Principal Problem:   Acute on chronic diastolic CHF (congestive heart failure) (HCC) Active Problems:   Hypertensive urgency   Type 2 diabetes mellitus with stage 4 chronic kidney disease, with long-term current use of insulin (HCC)   Mixed diabetic hyperlipidemia associated with type 2 diabetes mellitus (HCC)   CKD (chronic kidney disease), stage IV (HCC)   Diabetic ulcer of left foot (HCC)   Acute cardiogenic pulmonary edema (HCC)   Coronary artery disease involving native coronary artery of native heart without angina pectoris   Nancy [name] is a 60 year old female with PMH of HFpEF (05/2021 EF 55-60%), CKD stage 4 (baseline Cr 2.6-2.7), HTN, T2DM (hgb A1C 6.8% 08/2021), HLD, PAD, CAD (LHC 04/2021 with mild nonobstructive CAD),  pulmonary HTN, diabetic polyneuropathy, chronic left heel ulceration, and previous stroke, admitted to the ED for acute on chronic HFpEF exacerbation.  Acute on chronic HFpEF exacerbation in setting of hypertensive urgency  Acute cardiogenic pulmonary edema  10/22 echo EF 50-55%, moderate LVH, grade 2 DD, with no significant valvular abnormalities. Daily subjective improvement in SHOB, orthopnea, and leg swelling. 24 hr output 1.5 L, net output ~3.2 L; significant clinical improvement on torsemide and with 3 days of IV lasix. Improved 2+ to 1+  edema. Saturating well >98% on RA, and BP 140/80's.  Pt is medically stable for d/c, pending PT eval. She will f/u OP with her PCP this week and with nephrology.  - Torsemide 60 mg - Coreg 12.5 bid - Amlodipine 10 qd - Strict I/O and daily weights   Hx CAD  - ASA 81  - Atorvastatin 40 qd   T2DM A1c 6.8 in Sept. CBG 115-140's  - SSI - Lantus 30 -> 27 U daily at bedtime  CKD 4 Cr bump to 3.12 at baseline (2-3). Nephrology consulted during prior admission 1 mo ago: no indication for starting dialysis while IP, rec f/u with her nephrologist, Dr Nancy [name].  - avoid nephrotoxic agents  - CTM BMP - OP nephrology appt with Dr Nancy [name] to discuss dialysis initation  Gangrenous Diabetic Foot Ulcer, heel of left foot Left and Right Dorsal Foot Skin Lacerations - Continue wound care  - Continue OP follow up with wound care clinic, next appt 11/3.  Best Practice: Diet: Carb-Modified IVF: None,None VTE: Enoxaparin Code: Full   Nancy Manes, MD  Internal Medicine Resident, PGY-1 [name]  Pager: [phone] After 5pm on weekdays and 1pm on weekends: On Call pager [phone]

## 2021-10-09 NOTE — Progress Notes (Signed)
Pt wheezing increasing- although pt O2 sats 98-100% she says she feels SOB-  Called Resp Therapist to come assess the pt-  See MAR Also paged IM on call to request a possible CXR  MD on call ordered CXR- will monitor pt and await results

## 2021-10-09 NOTE — Plan of Care (Signed)
  Problem: Education: Goal: Knowledge of General Education information will improve Description: Including pain rating scale, medication(s)/side effects and non-pharmacologic comfort measures Outcome: Progressing   Problem: Clinical Measurements: Goal: Ability to maintain clinical measurements within normal limits will improve Outcome: Progressing Goal: Will remain free from infection Outcome: Progressing Goal: Diagnostic test results will improve Outcome: Progressing Goal: Respiratory complications will improve Outcome: Progressing Goal: Cardiovascular complication will be avoided Outcome: Progressing   Problem: Nutrition: Goal: Adequate nutrition will be maintained Outcome: Progressing   Problem: Coping: Goal: Level of anxiety will decrease Outcome: Progressing   Problem: Elimination: Goal: Will not experience complications related to urinary retention Outcome: Progressing   Problem: Pain Managment: Goal: General experience of comfort will improve Outcome: Progressing

## 2021-10-09 NOTE — Progress Notes (Signed)
Mobility Specialist Progress Note:   10/09/21 1055  Therapy Vitals  Pulse Rate 78  Mobility  Activity Ambulated in hall  Level of Assistance Contact guard assist, steadying assist  Assistive Device Front wheel walker  Distance Ambulated (ft) 135 ft  Mobility Ambulated with assistance in hallway  Mobility Response Tolerated well  Mobility performed by Mobility specialist  $Mobility charge 1 Mobility   Pre Mobility: HR 78 bpm During Mobility: HR 84 bpm Post Mobility: HR 81 bpm  Session done on RA. Pt wheezing during entire session, otherwise asx.    Nelta Numbers Mobility Specialist  Phone [phone]

## 2021-10-09 NOTE — Progress Notes (Signed)
Patient refused bipap.

## 2021-10-10 ENCOUNTER — Telehealth (HOSPITAL_COMMUNITY): Payer: Self-pay

## 2021-10-10 ENCOUNTER — Other Ambulatory Visit (HOSPITAL_COMMUNITY): Payer: Self-pay

## 2021-10-10 DIAGNOSIS — I5033 Acute on chronic diastolic (congestive) heart failure: Secondary | ICD-10-CM | POA: Diagnosis not present

## 2021-10-10 LAB — GLUCOSE, CAPILLARY
Glucose-Capillary: 136 mg/dL — ABNORMAL HIGH (ref 70–99)
Glucose-Capillary: 119 mg/dL — ABNORMAL HIGH (ref 70–99)
Glucose-Capillary: 102 mg/dL — ABNORMAL HIGH (ref 70–99)

## 2021-10-10 LAB — CBC
HCT: 28.2 % — ABNORMAL LOW (ref 36.0–46.0)
Hemoglobin: 8.8 g/dL — ABNORMAL LOW (ref 12.0–15.0)
MCH: 28.5 pg (ref 26.0–34.0)
MCHC: 31.2 g/dL (ref 30.0–36.0)
MCV: 91.3 fL (ref 80.0–100.0)
Platelets: 263 10*3/uL (ref 150–400)
RBC: 3.09 MIL/uL — ABNORMAL LOW (ref 3.87–5.11)
RDW: 14.7 % (ref 11.5–15.5)
WBC: 7.8 10*3/uL (ref 4.0–10.5)
nRBC: 0 % (ref 0.0–0.2)

## 2021-10-10 LAB — BASIC METABOLIC PANEL
Anion gap: 8 (ref 5–15)
BUN: 46 mg/dL — ABNORMAL HIGH (ref 8–23)
CO2: 26 mmol/L (ref 22–32)
Calcium: 8.9 mg/dL (ref 8.9–10.3)
Chloride: 97 mmol/L — ABNORMAL LOW (ref 98–111)
Creatinine, Ser: 3.12 mg/dL — ABNORMAL HIGH (ref 0.44–1.00)
GFR, Estimated: 16 mL/min — ABNORMAL LOW (ref >60)
Glucose, Bld: 80 mg/dL (ref 70–99)
Potassium: 5 mmol/L (ref 3.5–5.1)
Sodium: 131 mmol/L — ABNORMAL LOW (ref 135–145)

## 2021-10-10 MED ORDER — TORSEMIDE 20 MG PO TABS
60.0000 mg | ORAL_TABLET | Freq: Every day | ORAL | 0 refills | Status: DC
  Filled 2021-10-10: qty 90, 30d supply, fill #0

## 2021-10-10 MED ORDER — INSULIN PEN NEEDLE 32G X 4 MM MISC
0 refills | Status: DC
  Filled 2021-10-10: qty 100, 25d supply, fill #0

## 2021-10-10 MED ORDER — INSULIN GLARGINE 100 UNIT/ML SOLOSTAR PEN
30.0000 [IU] | PEN_INJECTOR | Freq: Every day | SUBCUTANEOUS | 3 refills | Status: DC
  Filled 2021-10-10: qty 9, 30d supply, fill #0

## 2021-10-10 MED ORDER — CALCIUM ACETATE (PHOS BINDER) 667 MG PO CAPS
667.0000 mg | ORAL_CAPSULE | Freq: Three times a day (TID) | ORAL | 0 refills | Status: DC
  Filled 2021-10-10: qty 90, 30d supply, fill #0

## 2021-10-10 MED ORDER — INSULIN GLARGINE-YFGN 100 UNIT/ML ~~LOC~~ SOLN
27.0000 [IU] | Freq: Every day | SUBCUTANEOUS | Status: DC
  Filled 2021-10-10: qty 0.27

## 2021-10-10 MED ORDER — INSULIN GLARGINE 100 UNIT/ML ~~LOC~~ SOLN
27.0000 [IU] | Freq: Every day | SUBCUTANEOUS | Status: DC
  Filled 2021-10-10: qty 0.27

## 2021-10-10 NOTE — TOC Initial Note (Signed)
Transition of Care [facility]) - Initial/Assessment Note    Patient Details  Name: Nancy [name] MRN: [id number] Date of Birth: 07-04-1961  Transition of Care [facility]) CM/SW Contact:    Erenest Rasher, RN Phone Number: [phone] 10/10/2021, 3:30 PM  Clinical Narrative:                 HF TOC CM spoke to pt at bedside. States she is active with [place] with [facility]. She has RW and bedside commode. Lives at home with husband. Will need HH orders with [facility] for resumption of care. Contacted [facility] with roc.   Expected Discharge Plan: [place] Barriers to Discharge: No Barriers Identified   Patient Goals and CMS Choice Patient states their goals for this hospitalization and ongoing recovery are:: return home to husband CMS Medicare.gov Compare Post Acute Care list provided to:: Patient Choice offered to / list presented to : Patient  Expected Discharge Plan and Services Expected Discharge Plan: [place] In-house Referral: Clinical Social Work Discharge Planning Services: CM Consult Post Acute Care Choice: [place] arrangements for the past 2 months: Hotel/Motel Expected Discharge Date: 10/10/21                         HH Arranged: RN, PT HH Agency: [facility] Date [facility] Agency Contacted: 10/10/21 Time HH Agency Contacted: 51 Representative spoke with at [place]: Bronwen Betters RN  Prior Living Arrangements/Services Living arrangements for the past 2 months: Hotel/Motel Lives with:: Spouse Patient language and need for interpreter reviewed:: Yes Do you feel safe going back to the place where you live?: Yes      Need for Family Participation in Patient Care: Yes (Comment) Care giver support system in place?: Yes (comment) Current home services: DME (rolling walker, 3n1 bedside commode) Criminal Activity/Legal Involvement Pertinent to Current Situation/Hospitalization: No - Comment as needed  Activities of Daily  Living Home Assistive Devices/Equipment: Cane (specify quad or straight) ADL Screening (condition at time of admission) Patient's cognitive ability adequate to safely complete daily activities?: Yes Is the patient deaf or have difficulty hearing?: Yes Does the patient have difficulty seeing, even when wearing glasses/contacts?: No Does the patient have difficulty concentrating, remembering, or making decisions?: No Patient able to express need for assistance with ADLs?: Yes Does the patient have difficulty dressing or bathing?: Yes Independently performs ADLs?: No Communication: Independent Dressing (OT): Needs assistance Is this a change from baseline?: Pre-admission baseline Grooming: Needs assistance Is this a change from baseline?: Pre-admission baseline Feeding: Independent Bathing: Needs assistance Is this a change from baseline?: Pre-admission baseline Toileting: Needs assistance Is this a change from baseline?: Pre-admission baseline In/Out Bed: Needs assistance Is this a change from baseline?: Pre-admission baseline Walks in Home: Needs assistance Is this a change from baseline?: Pre-admission baseline Does the patient have difficulty walking or climbing stairs?: Yes Weakness of Legs: Both Weakness of Arms/Hands: None  Permission Sought/Granted Permission sought to share information with : Case Manager, PCP, Family Supports, Customer service manager Permission granted to share information with : Yes, Verbal Permission Granted  Share Information with NAME: Nancy [name]     Permission granted to share info w Relationship: spouse  Permission granted to share info w Contact Information: [phone]  Emotional Assessment Appearance:: Appears stated age Attitude/Demeanor/Rapport: Engaged Affect (typically observed): Accepting Orientation: : Oriented to Self, Oriented to Place, Oriented to  Time, Oriented to Situation  Psych Involvement: No  (comment)  Admission diagnosis:  Respiratory distress [R06.03] Acute cardiogenic pulmonary edema (HCC) [I50.1] Patient Active Problem List   Diagnosis Date Noted   Acute cardiogenic pulmonary edema ([place]) 10/06/2021   Coronary artery disease involving native coronary artery of native heart without angina pectoris 10/06/2021   Goals of care, counseling/discussion    Gangrene of left foot ([place])    Cellulitis of left foot    Diabetic ulcer of left foot ([place]) 09/04/2021   CHF (congestive heart failure) ([place]) 07/07/2021   Normocytic anemia 06/30/2021   CKD (chronic kidney disease), stage IV ([place]) 06/11/2021   Acute on chronic diastolic CHF (congestive heart failure) ([place]) 06/10/2021   Abnormal nuclear stress test    Dyspnea on exertion 03/07/2021   Orthopnea 02/25/2021   History of cerebrovascular accident (CVA) with residual deficit 05/06/2020   Diabetic peripheral neuropathy ([place]) 04/26/2020   AKI (acute kidney injury) ([place]) 04/20/2020   Generalized weakness 04/20/2020   Dehydration 04/20/2020   Generalized abdominal pain    Pancreatitis, acute 02/29/2020   Cerebrovascular accident (CVA) ([place]) 11/08/2019   Stenosis of right carotid artery 11/08/2019   Mixed diabetic hyperlipidemia associated with type 2 diabetes mellitus ([place]) 11/08/2019   Cortical age-related cataract of both eyes 08/30/2019   Gait abnormality 08/30/2019   Statin declined 08/30/2019   Gastroesophageal reflux disease without esophagitis 04/18/2018   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   Type 2 diabetes mellitus with stage 4 chronic kidney disease, with long-term current use of insulin ([place]) 10/29/2017   History of macular degeneration 10/29/2017   Chronic cervical pain 10/29/2016   Myalgia 09/14/2016   Insomnia 09/14/2016   Tinea pedis of both feet 08/20/2016   Macular degeneration 08/17/2016   Low back pain 09/21/2014   Hypertensive urgency 09/21/2014   Diabetic gastroparesis associated with type 2 diabetes mellitus  ([place]) 09/14/2014   Thyroid nodule 08/17/2014   Obesity (BMI 30-39.9) 08/17/2014   Nausea & vomiting 08/04/2014   Type II diabetes mellitus with neurological manifestations, uncontrolled 08/04/2014   PCP:  [name], MD Pharmacy:   [name] [address] [place] [address] Phone: [phone] Fax: [place] [address] [place] [address] Phone: [phone] Fax: [phone]     Social Determinants of Health (SDOH) Interventions    Readmission Risk Interventions Readmission Risk Prevention Plan 09/21/2021 03/07/2020  Transportation Screening Complete Complete  PCP or Specialist Appt within 3-5 Days - Complete  HRI or [place] - (No Data)  Social Work Consult for [place] Planning/Counseling - Complete  Palliative Care Screening - Not Applicable  Medication Review Press photographer) Complete Complete  PCP or Specialist appointment within 3-5 days of discharge Complete -  [place] or Home Care Consult Complete -  SW Recovery Care/Counseling Consult Complete -  Palliative Care Screening Complete -  [place] Not Applicable -  Some recent data might be hidden

## 2021-10-10 NOTE — Telephone Encounter (Signed)
Spoke to Nancy [name] who agreed to initial home visit next Thursday at 2:00. Reminded her of her appointment for this Friday in clinic and she agreed to same. She reports she has her medication at home at present. Call complete.

## 2021-10-10 NOTE — Evaluation (Signed)
Occupational Therapy Evaluation Patient Details Name: Nancy [name] MRN: [id number] DOB: 08-07-1961 Today's Date: 10/10/2021   History of Present Illness 60 y.o. female admitted on 10/20 with worsening SOB and BLE edema. Patient admitted with acute on chronic diastolic CHF and hypertensive emergency. Previous admission 9/19 with necrotic L heel, ulceration and possible cellulitis/myositis followed by outpatient wound clinic. Patient also with admission ~1 month ago with CHF exacerbation. PMHx significant for anemia, depression, DMII with peripheral neuropathy, HLD, HTN, CVA, PAD, CHF, HOH and CKD.   Clinical Impression   PTA patient was living with her spouse in a motel and reports requiring assist for LB ADLs. Uses RW vs SPC for household mobility. Patient currently requiring set-up assist for UB ADLs and Min to Mod A grossly for LB ADLs with use of RW. Patient also limited by deficits listed below including decreased cardiopulmonary status and would benefit from continued acute OT services in prep for safe d/c home with spouse. OT will continue to follow acutely.      Recommendations for follow up therapy are one component of a multi-disciplinary discharge planning process, led by the attending physician.  Recommendations may be updated based on patient status, additional functional criteria and insurance authorization.   Follow Up Recommendations  Home health OT    Assistance Recommended at Discharge Intermittent Supervision/Assistance  Functional Status Assessment  Patient has had a recent decline in their functional status and demonstrates the ability to make significant improvements in function in a reasonable and predictable amount of time.  Equipment Recommendations  None recommended by OT    Recommendations for Other Services       Precautions / Restrictions Precautions Precautions: Fall;Other (comment) Precaution Comments: Urine incontinence, L foot wound with foul  odor Required Braces or Orthoses: Other Brace Other Brace: L foot PRAFO with ambulation Restrictions Weight Bearing Restrictions: Yes LLE Weight Bearing: Weight bearing as tolerated      Mobility Bed Mobility Overal bed mobility: Needs Assistance Bed Mobility: Supine to Sit     Supine to sit: Supervision;HOB elevated     General bed mobility comments: No physical assist required, increased time and effort    Transfers Overall transfer level: Needs assistance Equipment used: Rolling walker (2 wheels) Transfers: Sit to/from Stand Sit to Stand: Min guard           General transfer comment: Min guard for steady. Good recall for hand placement.      Balance Overall balance assessment: Needs assistance Sitting-balance support: Feet supported;No upper extremity supported Sitting balance-Leahy Scale: Good     Standing balance support: Bilateral upper extremity supported;During functional activity;Reliant on assistive device for balance Standing balance-Leahy Scale: Fair Standing balance comment: Able to maintain static standing at sink level without UE support during grooming tasks. Reliant on BUE support on RW with mobility.                           ADL either performed or assessed with clinical judgement   ADL Overall ADL's : Needs assistance/impaired     Grooming: Supervision/safety;Standing Grooming Details (indicate cue type and reason): 2/3 grooming tasks standing at sink level with supervision A. Upper Body Bathing: Set up;Sitting   Lower Body Bathing: Minimal assistance;Sit to/from stand   Upper Body Dressing : Set up;Sitting   Lower Body Dressing: Minimal assistance;Sit to/from stand   Toilet Transfer: Min guard;Rolling walker (2 wheels) Toilet Transfer Details (indicate cue type and reason): Simulated with transfer  to recliner with Min guard and use of RW.                 Vision Baseline Vision/History: 0 No visual deficits  (Readers) Ability to See in Adequate Light: 0 Adequate Patient Visual Report: No change from baseline Vision Assessment?: No apparent visual deficits     Perception     Praxis      Pertinent Vitals/Pain Pain Assessment: No/denies pain Pain Intervention(s): Monitored during session     Hand Dominance Right   Extremity/Trunk Assessment Upper Extremity Assessment Upper Extremity Assessment: Generalized weakness   Lower Extremity Assessment Lower Extremity Assessment: Defer to PT evaluation   Cervical / Trunk Assessment Cervical / Trunk Assessment: Normal   Communication Communication Communication: No difficulties   Cognition Arousal/Alertness: Awake/alert Behavior During Therapy: Flat affect Overall Cognitive Status: No family/caregiver present to determine baseline cognitive functioning Area of Impairment: Attention;Memory;Following commands;Safety/judgement;Awareness;Problem solving                   Current Attention Level: Sustained Memory: Decreased short-term memory Following Commands: Follows one step commands with increased time   Awareness: Emergent Problem Solving: Slow processing General Comments: A&Ox4; follows 1-2 step verbal commands with increased time.     General Comments  VSS on RA. 1/4 DOE with activity.    Exercises     Shoulder Instructions      Home Living Family/patient expects to be discharged to:: Other (Comment) (Motel with spouse) Living Arrangements: Spouse/significant other Available Help at Discharge: Family;Available PRN/intermittently (Husband works on the other side of town from 3-11pm)         Home Layout: One level     Bathroom Shower/Tub: Teacher, early years/pre: Handicapped height     [place]: Conservation officer, nature (2 wheels);Cane - single point;BSC   Additional Comments: Active with HHPT PTA.      Prior Functioning/Environment Prior Level of Function : Needs assist       Physical Assist :  ADLs (physical)   ADLs (physical): Dressing;Bathing;IADLs Mobility Comments: RW vs SPC ADLs Comments: Assist for LB bathing/dressing. Has urinary incontinence. Does not use briefs.        OT Problem List: Decreased strength;Impaired balance (sitting and/or standing);Decreased activity tolerance;Decreased cognition;Cardiopulmonary status limiting activity;Increased edema      OT Treatment/Interventions: Self-care/ADL training;Therapeutic exercise;DME and/or AE instruction;Therapeutic activities;Patient/family education;Balance training;Energy conservation;Cognitive remediation/compensation    OT Goals(Current goals can be found in the care plan section) Acute Rehab OT Goals Patient Stated Goal: To return home. OT Goal Formulation: With patient Time For Goal Achievement: 10/24/21 Potential to Achieve Goals: Good ADL Goals Pt Will Perform Grooming: with modified independence;standing Pt Will Perform Upper Body Dressing: Independently;sitting Pt Will Perform Lower Body Dressing: with modified independence;with adaptive equipment;sit to/from stand Pt Will Transfer to Toilet: with modified independence;ambulating;bedside commode Pt Will Perform Toileting - Clothing Manipulation and hygiene: with modified independence;sit to/from stand Pt/caregiver will Perform Home Exercise Program: Increased ROM;Increased strength;Both right and left upper extremity  OT Frequency: Min 2X/week   Barriers to D/C: Decreased caregiver support  PRN supervision/assist from husband.       Co-evaluation              AM-PAC OT "6 Clicks" Daily Activity     Outcome Measure Help from another person eating meals?: A Little Help from another person taking care of personal grooming?: A Little Help from another person toileting, which includes using toliet, bedpan, or urinal?: A Little Help from  another person bathing (including washing, rinsing, drying)?: A Little Help from another person to put on and  taking off regular upper body clothing?: A Little Help from another person to put on and taking off regular lower body clothing?: A Little 6 Click Score: 18   End of Session Equipment Utilized During Treatment: Gait belt;Rolling walker (2 wheels) Nurse Communication: Mobility status  Activity Tolerance: Patient tolerated treatment well;Patient limited by fatigue Patient left: in chair;with call bell/phone within reach;with chair alarm set  OT Visit Diagnosis: Other abnormalities of gait and mobility (R26.89);Muscle weakness (generalized) (M62.81);Other symptoms and signs involving cognitive function                Time: 0940-7680 OT Time Calculation (min): 25 min Charges:  OT General Charges $OT Visit: 1 Visit OT Evaluation $OT Eval Low Complexity: 1 Low OT Treatments $Self Care/Home Management : 8-22 mins  Arlita Buffkin H. OTR/L Supplemental OT, Department of rehab services [phone]  Erby Sanderson R H. 10/10/2021, 9:33 AM

## 2021-10-10 NOTE — Progress Notes (Signed)
Nancy, [name] ([id number]) Visit Report for 10/04/2021 Chief Complaint Document Details Patient Name: Date of Service: [name] 10/04/2021 9:00 A M Medical Record Number: [id number] Patient Account Number: [id number] Date of Birth/Sex: Treating RN: Dec 24, 1960 (60 y.o. Nancy Fetter Primary Care Provider: [name] Other Clinician: Referring Provider: Treating Provider/Extender: Adrian Prows, Pecola Leisure in Treatment: 0 Information Obtained from: Patient Chief Complaint 10/04/2021; patient is here for review of wounds on her left foot x2 and left heel Electronic Signature(s) Signed: 10/04/2021 4:24:04 PM By: Linton Ham MD Entered By: Linton Ham on 10/04/2021 10:24:31 -------------------------------------------------------------------------------- HPI Details Patient Name: Date of Service: Nancy [name]. 10/04/2021 9:00 A M Medical Record Number: [id number] Patient Account Number: [id number] Date of Birth/Sex: Treating RN: 08/16/1961 (60 y.o. Nancy Fetter Primary Care Provider: [name] Other Clinician: Referring Provider: Treating Provider/Extender: Nyra Market in Treatment: 0 History of Present Illness HPI Description: ADMISSION 10/04/2021 This is a 60 year old woman with multiple severe medical issues who arrives for review of wounds on her left foot. Recent medical history in epic shows a cellulitis of these left foot with gangrenous change and admission to hospital from 9/19 through 10/6. An MRI of the foot surprisingly did not show osteomyelitis. In the hospital she was treated with IV antibiotics and then discharged on Augmentin and doxycycline she has completed this. She also had noninvasive arterial studies that showed an ABI in the right of 0.45 on the left 0.46 monophasic waveforms bilaterally. They did not do TBI's. The patient was seen by Dr. Trula Slade in consult in  the hospital. He felt she had evidence of arterial occlusive disease with an ABI on the left of 0.5 with monophasic waveforms at the ankle. Noted that any attempt at using diet in this patient would potentially push her into acute renal failure and with the patient who has been refusing dialysis this would be a fatal event. The patient saw palliative care but she was not interested in pursuing pure palliative care or SNF placement she has Medicaid pending but she does have home health The patient also was seen by nephrology in the hospital. She told them she did not want dialysis I think because her mother was on dialysis at 1 point. They did recommend a AV fistula. Again the patient refused. They noted deterioration from stage IV to stage V chronic renal failure but they did not feel there was an urgent need for dialysis at that moment. The patient has a large boggy ischemic wound on most of her left heel. On the left lateral foot there is an open area with covering dry gangrene. On the dorsal foot just distal to the ankle there is a better looking wound surface that may have been a wrap injury at 1 point. The patient says that her heel and lateral foot have been there for about 2 months and the dorsal foot is uncertain. She has minimal tolerance with walking because of pain in her legs likely claudication. 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 Her discharge creatinine in the hospital was 2.75 with an estimated GFR of 19. It was much higher than this on presentation. Both vascular and Ortho recommended an amputation which the patient refused Electronic Signature(s) Signed: 10/04/2021 4:24:04 PM By: Linton Ham MD Entered By: Linton Ham on 10/04/2021 10:30:48 -------------------------------------------------------------------------------- Physical Exam  Details Patient Name: Date  of Service: [name] 10/04/2021 9:00 A M Medical Record Number: [id number] Patient Account Number: [id number] Date of Birth/Sex: Treating RN: 1961/08/05 (60 y.o. Nancy Fetter Primary Care Provider: [name] Other Clinician: Referring Provider: Treating Provider/Extender: Nyra Market in Treatment: 0 Constitutional Patient is hypertensive.. Pulse regular and within target range for patient.Marland Kitchen Respirations regular, non-labored and within target range.. Temperature is normal and within the target range for the patient.Marland Kitchen Appears in no distress. Respiratory work of breathing is normal. Bilateral breath sounds are clear and equal in all lobes with no wheezes, rales or rhonchi.. Cardiovascular Heart rhythm and rate regular, without murmur or gallop.. Pedal pulses absent bilaterally. In the feet. I believe I could feel popliteal pulses. Massive pitting edema up into her groin area. She also has coccyx pitting edema extending up her back. Psychiatric Patient appears depressed today.. Notes Wound exam; the patient has a large boggy ulcer over her left heel somewhat malodorous but not overtly infected. On the lateral foot an open area with black eschar/dry gangrene. On the dorsal foot a linear wound this does not look as bad might of been a wrap injury at some point. Electronic Signature(s) Signed: 10/04/2021 4:24:04 PM By: Linton Ham MD Entered By: Linton Ham on 10/04/2021 10:32:16 -------------------------------------------------------------------------------- Physician Orders Details Patient Name: Date of Service: [place]. 10/04/2021 9:00 A M Medical Record Number: [id number] Patient Account Number: [id number] Date of Birth/Sex: Treating RN: 1961-09-25 (60 y.o. Nancy [name], Nancy [name] Primary Care Provider: [name] Other Clinician: Referring Provider: Treating Provider/Extender:  Nyra Market in Treatment: 0 Verbal / Phone Orders: No Diagnosis Coding Follow-up Appointments ppointment in 2 weeks. - D.r. Dellia Nims. Return A Bathing/ Shower/ Hygiene May shower with protection but do not get wound dressing(s) wet. Edema Control - Lymphedema / SCD / Other Elevate legs to the level of the heart or above for 30 minutes daily and/or when sitting, a frequency of: Avoid standing for long periods of time. Off-Loading Other: - Offloading boot to left foot; float heel/foot when sitting/laying down. Use pillow and place under your leg so your heel isnt laying against bed!!!! [place] wound care orders this week; continue Home Health for wound care. May utilize formulary equivalent dressing for wound treatment orders unless otherwise specified. - [facility] to change 2-3 x a wk Wound Treatment Wound #1 - Foot Wound Laterality: Dorsal, Left Cleanser: Normal Saline (Home Health) Every Other Day/15 Days Discharge Instructions: Cleanse the wound with Normal Saline prior to applying a clean dressing using gauze sponges, not tissue or cotton balls. Cleanser: Soap and Water [facility]) Every Other Day/15 Days Discharge Instructions: May shower and wash wound with dial antibacterial soap and water prior to dressing change. Prim Dressing: Promogran Prisma Matrix, 4.34 (sq in) (silver collagen) (Home Health) Every Other Day/15 Days ary Discharge Instructions: Moisten collagen with saline or hydrogel Secondary Dressing: Woven Gauze Sponge, Non-Sterile 4x4 in [facility]) Every Other Day/15 Days Discharge Instructions: Apply over primary dressing as directed. Secondary Dressing: ABD Pad, 5x9 [facility]) Every Other Day/15 Days Discharge Instructions: Apply over primary dressing as directed. Secured With: [employer], 4.5x3.1 (in/yd) [facility]) Every Other Day/15 Days Discharge Instructions: Secure with Kerlix as directed. Secured  With: 52M Medipore H Soft Cloth Surgical T ape, 4 x 10 (in/yd) (Home Health) Every Other Day/15 Days Discharge Instructions: Secure with tape as directed. Wound #2 - Calcaneus Wound  Laterality: Left Cleanser: Normal Saline (Home Health) 1 x Per YNW/29 Days Discharge Instructions: Cleanse the wound with Normal Saline prior to applying a clean dressing using gauze sponges, not tissue or cotton balls. Cleanser: Soap and Water [facility]) 1 x Per FAO/13 Days Discharge Instructions: May shower and wash wound with dial antibacterial soap and water prior to dressing change. Cleanser: Wound Cleanser (Home Health) 1 x Per Day/15 Days Discharge Instructions: Cleanse the wound with wound cleanser prior to applying a clean dressing using gauze sponges, not tissue or cotton balls. Prim Dressing: betadine 1 x Per Day/15 Days ary Discharge Instructions: Apply betadine to wound areas daily Secondary Dressing: Woven Gauze Sponge, Non-Sterile 4x4 in (Home Health) 1 x Per Day/15 Days Discharge Instructions: Apply over primary dressing as directed. Secondary Dressing: ABD Pad, 5x9 (Home Health) 1 x Per Day/15 Days Discharge Instructions: Apply over primary dressing as directed. Secured With: [employer], 4.5x3.1 (in/yd) (Home Health) 1 x Per Day/15 Days Discharge Instructions: Secure with Kerlix as directed. Secured With: 77M Medipore H Soft Cloth Surgical T ape, 4 x 10 (in/yd) (Home Health) 1 x Per Day/15 Days Discharge Instructions: Secure with tape as directed. Wound #3 - Foot Wound Laterality: Left, Lateral Cleanser: Normal Saline (Home Health) 1 x Per Day/15 Days Discharge Instructions: Cleanse the wound with Normal Saline prior to applying a clean dressing using gauze sponges, not tissue or cotton balls. Cleanser: Soap and Water [facility]) 1 x Per YQM/57 Days Discharge Instructions: May shower and wash wound with dial antibacterial soap and water prior to dressing change. Cleanser: Wound  Cleanser (Home Health) 1 x Per Day/15 Days Discharge Instructions: Cleanse the wound with wound cleanser prior to applying a clean dressing using gauze sponges, not tissue or cotton balls. Prim Dressing: betadine 1 x Per Day/15 Days ary Discharge Instructions: Apply betadine to wound areas daily Secondary Dressing: Woven Gauze Sponge, Non-Sterile 4x4 in (Home Health) 1 x Per Day/15 Days Discharge Instructions: Apply over primary dressing as directed. Secondary Dressing: ABD Pad, 5x9 (Home Health) 1 x Per Day/15 Days Discharge Instructions: Apply over primary dressing as directed. Secured With: [employer], 4.5x3.1 (in/yd) (Home Health) 1 x Per Day/15 Days Discharge Instructions: Secure with Kerlix as directed. Secured With: 77M Medipore H Soft Cloth Surgical T ape, 4 x 10 (in/yd) (Home Health) 1 x Per Day/15 Days Discharge Instructions: Secure with tape as directed. Electronic Signature(s) Signed: 10/04/2021 4:24:04 PM By: Linton Ham MD Signed: 10/10/2021 5:23:14 PM By: Rhae Hammock RN Entered By: Rhae Hammock on 10/04/2021 10:05:02 -------------------------------------------------------------------------------- Problem List Details Patient Name: Date of Service: [name] [facility] [facility]. 10/04/2021 9:00 A M Medical Record Number: [id number] Patient Account Number: [id number] Date of Birth/Sex: Treating RN: Oct 14, 1961 (60 y.o. 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 10/04/2021 No Yes severity L97.528  Non-pressure chronic ulcer of other part of left foot with other specified 10/04/2021 No Yes severity I12.0 Hypertensive chronic kidney disease with stage 5 chronic kidney disease or 10/04/2021 No Yes end stage renal disease Inactive Problems Resolved Problems Electronic Signature(s) Signed: 10/04/2021 4:24:04 PM By: Linton Ham MD Entered By:  Linton Ham on 10/04/2021 10:03:48 -------------------------------------------------------------------------------- Progress Note Details Patient Name: Date of Service: [name] 10/04/2021 9:00 A M Medical Record Number: [id number] Patient Account Number: [id number] Date of Birth/Sex: Treating RN: November 23, 1961 (60 y.o. Nancy Fetter Primary Care Provider: [name] Other Clinician: Referring Provider: Treating Provider/Extender: Nyra Market in Treatment: 0 Subjective Chief Complaint Information obtained from Patient 10/04/2021; patient is here for review of wounds on her left foot x2 and left heel History of Present Illness (HPI) ADMISSION 10/04/2021 This is a 60 year old woman with multiple severe medical issues who arrives for review of wounds on her left foot. Recent medical history in epic shows a cellulitis of these left foot with gangrenous change and admission to hospital from 9/19 through 10/6. An MRI of the foot surprisingly did not show osteomyelitis. In the hospital she was treated with IV antibiotics and then discharged on Augmentin and doxycycline she has completed this. She also had noninvasive arterial studies that showed an ABI in the right of 0.45 on the left 0.46 monophasic waveforms bilaterally. They did not do TBI's. The patient was seen by Dr. Trula Slade in consult in the hospital. He felt she had evidence of arterial occlusive disease with an ABI on the left of 0.5 with monophasic waveforms at the ankle. Noted that any attempt at using diet in this patient would  potentially push her into acute renal failure and with the patient who has been refusing dialysis this would be a fatal event. The patient saw palliative care but she was not interested in pursuing pure palliative care or SNF placement she has Medicaid pending but she does have home health The patient also was seen by nephrology in the hospital. She told them she did not want dialysis I think because her mother was on dialysis at 1 point. They did recommend a AV fistula. Again the patient refused. They noted deterioration from stage IV to stage V chronic renal failure but they did not feel there was an urgent need for dialysis at that moment. The patient has a large boggy ischemic wound on most of her left heel. On the left lateral foot there is an open area with covering dry gangrene. On the dorsal foot just distal to the ankle there is a better looking wound surface that may have been a wrap injury at 1 point. The patient says that her heel and lateral foot have been there for about 2 months and the dorsal foot is uncertain. She has minimal tolerance with walking because of pain in her legs likely claudication. 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 Her discharge creatinine in the hospital was 2.75 with an estimated GFR of 19. It was much higher than this on presentation. Both vascular and Ortho recommended an amputation which the patient refused Patient History Unable to Obtain Patient History due to Altered Mental Status. Information obtained from Patient, Caregiver, Chart. Allergies Sulfa (Sulfonamide Antibiotics), hydralazine, hydrocodone, metformin, lettuce, hydroxychloroquine sulfate, Shellfish Containing Products, shrimp, metformin Family History Heart Disease - Mother,Father, No family history of Cancer, Diabetes, Hereditary Spherocytosis, Kidney Disease, Lung  Disease. Social History Never smoker, Marital Status - Married, Alcohol Use - Never, Drug Use - No History, Caffeine Use - Moderate. Medical History Eyes Denies history of Cataracts, Glaucoma, Optic Neuritis Cardiovascular Patient has history of Congestive Heart Failure, Hypertension, Peripheral Arterial Disease Denies history of Angina, Arrhythmia, Coronary Artery  Disease, Deep Vein Thrombosis, Hypotension, Myocardial Infarction, Peripheral Venous Disease, Phlebitis, Vasculitis Endocrine Patient has history of Type II Diabetes Denies history of Type I Diabetes Genitourinary Denies history of End Stage Renal Disease Integumentary (Skin) Denies history of History of Burn Musculoskeletal Denies history of Gout, Rheumatoid Arthritis, Osteoarthritis, Osteomyelitis Neurologic Patient has history of Dementia, Neuropathy Denies history of Quadriplegia, Paraplegia, Seizure Disorder Psychiatric Denies history of Anorexia/bulimia, Confinement Anxiety Medical A Surgical History Notes 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 Eyes Denies complaints or symptoms of Dry Eyes, Vision Changes, Glasses / Contacts. Ear/Nose/Mouth/Throat Denies complaints or symptoms of Chronic sinus problems or rhinitis. Respiratory Denies complaints or symptoms of Chronic or frequent coughs, Shortness of Breath. Cardiovascular Denies complaints or symptoms of Chest pain. Gastrointestinal Denies complaints or symptoms of Frequent diarrhea, Nausea, Vomiting. Integumentary (Skin) Complains or has symptoms of Wounds. Musculoskeletal Denies complaints or symptoms of Muscle Pain, Muscle Weakness. Psychiatric Denies complaints or symptoms of Claustrophobia, Suicidal. Objective Constitutional Patient is hypertensive.. Pulse regular and within  target range for patient.Marland Kitchen Respirations regular, non-labored and within target range.. Temperature is normal and within the target range for the patient.Marland Kitchen Appears in no distress. Vitals Time Taken: 9:04 AM, Height: 63 in, Source: Stated, Weight: 232 lbs, Source: Stated, BMI: 41.1, Temperature: 98.9 F, Pulse: 84 bpm, Respiratory Rate: 17 breaths/min, Blood Pressure: 174/74 mmHg, Capillary Blood Glucose: 178 mg/dl. Respiratory work of breathing is normal. Bilateral breath sounds are clear and equal in all lobes with no wheezes, rales or rhonchi.. Cardiovascular Heart rhythm and rate regular, without murmur or gallop.. Pedal pulses absent bilaterally. In the feet. I believe I could feel popliteal pulses. Massive pitting edema up into her groin area. She also has coccyx pitting edema extending up her back. Psychiatric Patient appears depressed today.. General Notes: Wound exam; the patient has a large boggy ulcer over her left heel somewhat malodorous but not overtly infected. On the lateral foot an open area with black eschar/dry gangrene. On the dorsal foot a linear wound this does not look as bad might of been a wrap injury at some point. Integumentary (Hair, Skin) Wound #1 status is Open. Original cause of wound was Gradually Appeared. The date acquired was: 09/04/2021. The wound is located on the Left,Dorsal Foot. The wound measures 1cm length x 4.3cm width x 0.1cm depth; 3.377cm^2 area and 0.338cm^3 volume. There is no tunneling or undermining noted. There is a medium amount of serosanguineous drainage noted. Foul odor after cleansing was noted. The wound margin is distinct with the outline attached to the wound base. There is no granulation within the wound bed. There is a large (67-100%) amount of necrotic tissue within the wound bed including Eschar and Adherent Slough. Wound #2 status is Open. Original cause of wound was Gradually Appeared. The date acquired was: 08/04/2021. The wound is  located on the Left Calcaneus. The wound measures 5.8cm length x 5cm width x 0.1cm depth; 22.777cm^2 area and 2.278cm^3 volume. There is no tunneling or undermining noted. There is a medium amount of serosanguineous drainage noted. Foul odor after cleansing was noted. The wound margin is distinct with the outline attached to the wound base. There is no granulation within the wound bed. There is a large (67-100%) amount of necrotic tissue within the wound bed including Eschar and Adherent Slough. Wound #3 status is Open. Original cause of wound was Gradually Appeared. The  date acquired was: 08/04/2021. The wound is located on the Left,Lateral Foot. The wound measures 2cm length x 3.2cm width x 0.1cm depth; 5.027cm^2 area and 0.503cm^3 volume. There is no tunneling or undermining noted. There is a medium amount of serosanguineous drainage noted. The wound margin is distinct with the outline attached to the wound base. There is no granulation within the wound bed. There is a large (67-100%) amount of necrotic tissue within the wound bed including Eschar and Adherent Slough. 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 - D.r. Dellia Nims. Bathing/ Shower/ Hygiene: May shower with protection but do not get wound dressing(s) wet. Edema Control - Lymphedema / SCD / Other: Elevate legs to the level of the heart or above for 30 minutes daily and/or when sitting, a frequency of: Avoid standing for long periods of time. Off-Loading: Other: - Offloading boot to left foot; float  heel/foot when sitting/laying down. Use pillow and place under your leg so your heel isnt laying against bed!!!! Home Health: New wound care orders this week; continue Home Health for wound care. May utilize formulary equivalent dressing for wound treatment orders unless otherwise specified. - [place] to change 2-3 x a wk WOUND #1: - Foot Wound Laterality: Dorsal, Left Cleanser: Normal Saline (Home Health) Every Other Day/15 Days Discharge Instructions: Cleanse the wound with Normal Saline prior to applying a clean dressing using gauze sponges, not tissue or cotton balls. Cleanser: Soap and Water [facility]) Every Other Day/15 Days Discharge Instructions: May shower and wash wound with dial antibacterial soap and water prior to dressing change. Prim Dressing: Promogran Prisma Matrix, 4.34 (sq in) (silver collagen) (Home Health) Every Other Day/15 Days ary Discharge Instructions: Moisten collagen with saline or hydrogel Secondary Dressing: Woven Gauze Sponge, Non-Sterile 4x4 in [facility]) Every Other Day/15 Days Discharge Instructions: Apply over primary dressing as directed. Secondary Dressing: ABD Pad, 5x9 [facility]) Every Other Day/15 Days Discharge Instructions: Apply over primary dressing as directed. Secured With: [employer], 4.5x3.1 (in/yd) [facility]) Every Other Day/15 Days Discharge Instructions: Secure with Kerlix as directed. Secured With: 96M Medipore H Soft Cloth Surgical T ape, 4 x 10 (in/yd) (Home Health) Every Other Day/15 Days Discharge Instructions: Secure with tape as directed. WOUND #2: - Calcaneus Wound Laterality: Left Cleanser: Normal Saline (Home Health) 1 x Per WLN/98 Days Discharge Instructions: Cleanse the wound with Normal Saline prior to applying a clean dressing using gauze sponges, not tissue or cotton balls. Cleanser: Soap and Water [facility]) 1 x Per XQJ/19 Days Discharge Instructions: May shower and wash wound with dial  antibacterial soap and water prior to dressing change. Cleanser: Wound Cleanser (Home Health) 1 x Per Day/15 Days Discharge Instructions: Cleanse the wound with wound cleanser prior to applying a clean dressing using gauze sponges, not tissue or cotton balls. Prim Dressing: betadine 1 x Per Day/15 Days ary Discharge Instructions: Apply betadine to wound areas daily Secondary Dressing: Woven Gauze Sponge, Non-Sterile 4x4 in (Home Health) 1 x Per Day/15 Days Discharge Instructions: Apply over primary dressing as directed. Secondary Dressing: ABD Pad, 5x9 (Home Health) 1 x Per ERD/40 Days Discharge  Instructions: Apply over primary dressing as directed. Secured With: [employer], 4.5x3.1 (in/yd) (Home Health) 1 x Per Day/15 Days Discharge Instructions: Secure with Kerlix as directed. Secured With: 11M Medipore H Soft Cloth Surgical T ape, 4 x 10 (in/yd) (Home Health) 1 x Per Day/15 Days Discharge Instructions: Secure with tape as directed. WOUND #3: - Foot Wound Laterality: Left, Lateral Cleanser: Normal Saline (Home Health) 1 x Per FXT/02 Days Discharge Instructions: Cleanse the wound with Normal Saline prior to applying a clean dressing using gauze sponges, not tissue or cotton balls. Cleanser: Soap and Water [facility]) 1 x Per IOX/73 Days Discharge Instructions: May shower and wash wound with dial antibacterial soap and water prior to dressing change. Cleanser: Wound Cleanser (Home Health) 1 x Per Day/15 Days Discharge Instructions: Cleanse the wound with wound cleanser prior to applying a clean dressing using gauze sponges, not tissue or cotton balls. Prim Dressing: betadine 1 x Per Day/15 Days ary Discharge Instructions: Apply betadine to wound areas daily Secondary Dressing: Woven Gauze Sponge, Non-Sterile 4x4 in (Home Health) 1 x Per Day/15 Days Discharge Instructions: Apply over primary dressing as directed. Secondary Dressing: ABD Pad, 5x9 (Home Health) 1 x Per Day/15  Days Discharge Instructions: Apply over primary dressing as directed. Secured With: [employer], 4.5x3.1 (in/yd) (Home Health) 1 x Per Day/15 Days Discharge Instructions: Secure with Kerlix as directed. Secured With: 11M Medipore H Soft Cloth Surgical T ape, 4 x 10 (in/yd) (Home Health) 1 x Per Day/15 Days Discharge Instructions: Secure with tape as directed. 1. Limb threatening ischemia on the left. This did not seem much different from the hospital description. She is using Betadine on the heel and Betadine on the lateral foot which I did not change. We will use silver collagen on the better looking wound dorsally. Kerlix wrap. 2. It is not clear to me that patient really understood anything about what she was told in the hospital. First of all she is very hard of hearing. Her husband I think would have to be there to have complicated medical issues. It is not clear to me that she would refuse dialysis. 3. I agree with the concern in this situation about using contrast for angiography especially if she is refusing dialysis. However right now she does not have pain at rest these are not actively infected but she does have claudication with minimal activity 4. She apparently has seen nephrology in the past. I think based on review of the hospitalization and currently I think she is at least would need preparation for dialysis if she would agree to actually go through it. I spent a considerable amount of time talking to her and her husband about this today including a brief review of what dialysis is, preparation etc. 5. I will review her again in 2 weeks. I think the husband was not present because he works second shift during the intense discussions in the hospital. I think he would need to be present for any further discussion Electronic Signature(s) Signed: 10/04/2021 4:24:04 PM By: Linton Ham MD Entered By: Linton Ham on 10/04/2021  10:35:22 -------------------------------------------------------------------------------- HxROS Details Patient Name: Date of Service: [place]. 10/04/2021 9:00 A M Medical Record Number: [id number] Patient Account Number: [id number] Date of Birth/Sex: Treating RN: [birth year] (60 y.o. Nancy [name], Nancy [name] Primary Care Provider: [name] Other Clinician: Referring Provider: Treating Provider/Extender: Nyra Market in Treatment: 0 Unable to Obtain Patient History due to Altered Mental  Status Information Obtained From Patient Caregiver Chart Constitutional Symptoms (General Health) Complaints and Symptoms: Negative for: Fatigue; Fever; Chills; Marked Weight Change Eyes Complaints and Symptoms: Negative for: Dry Eyes; Vision Changes; Glasses / Contacts Medical History: Negative for: Cataracts; Glaucoma; Optic Neuritis Ear/Nose/Mouth/Throat Complaints and Symptoms: Negative for: Chronic sinus problems or rhinitis Respiratory Complaints and Symptoms: Negative for: Chronic or frequent coughs; Shortness of Breath Cardiovascular Complaints and Symptoms: Negative for: Chest pain Medical History: Positive for: Congestive Heart Failure; Hypertension; Peripheral Arterial Disease Negative for: Angina; Arrhythmia; Coronary Artery Disease; Deep Vein Thrombosis; Hypotension; Myocardial Infarction; Peripheral Venous Disease; Phlebitis; Vasculitis Gastrointestinal Complaints and Symptoms: Negative for: Frequent diarrhea; Nausea; Vomiting Integumentary (Skin) Complaints and Symptoms: Positive for: Wounds Medical History: Negative for: History of Burn Musculoskeletal Complaints and Symptoms: Negative for: Muscle Pain; Muscle Weakness Medical History: Negative for: Gout; Rheumatoid Arthritis; Osteoarthritis; Osteomyelitis Psychiatric Complaints and Symptoms: Negative for: Claustrophobia; Suicidal Medical History: Negative for:  Anorexia/bulimia; Confinement Anxiety Past Medical History Notes: hallucinations with risperidone Hematologic/Lymphatic Endocrine Medical History: Positive for: Type II Diabetes Negative for: Type I Diabetes Genitourinary Medical History: Negative for: End Stage Renal Disease Past Medical History Notes: CKDIV Immunological Neurologic Medical History: Positive for: Dementia; Neuropathy Negative for: Quadriplegia; Paraplegia; Seizure Disorder Oncologic Immunizations Pneumococcal Vaccine: Received Pneumococcal Vaccination: Yes Received Pneumococcal Vaccination On or After 60th Birthday: Yes Implantable Devices None Family and Social History Cancer: No; Diabetes: No; Heart Disease: Yes - Mother,Father; Hereditary Spherocytosis: No; Kidney Disease: No; Lung Disease: No; Never smoker; Marital Status - Married; Alcohol Use: Never; Drug Use: No History; Caffeine Use: Moderate; Financial Concerns: No; Food, Clothing or Shelter Needs: No; Support System Lacking: No; Transportation Concerns: No Electronic Signature(s) Signed: 10/04/2021 4:24:04 PM By: Linton Ham MD Signed: 10/10/2021 5:23:14 PM By: Rhae Hammock RN Entered By: Rhae Hammock on 10/04/2021 09:15:47 -------------------------------------------------------------------------------- SuperBill Details Patient Name: Date of Service: [name] 10/04/2021 Medical Record Number: [id number] Patient Account Number: [id number] Date of Birth/Sex: Treating RN: 11-12-1961 (60 y.o. 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 Facility Procedures CPT4 Code: [id number] Description: 17510 - WOUND CARE VISIT-LEV 5 EST PT Modifier: Quantity: 1 Physician Procedures : CPT4 Code Description Modifier [id number] - WC PHYS LEVEL 4 - NEW PT ICD-10 Diagnosis Description E11.621 Type 2 diabetes mellitus with foot ulcer E11.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

## 2021-10-10 NOTE — Plan of Care (Signed)

## 2021-10-10 NOTE — Progress Notes (Signed)
Discharge instructions reviewed with patient.  Written instructions given to patient with medications and follow up appointments. TOC delivered medications to patient. Via wheelchair in stable condition to spouses waiting car.

## 2021-10-10 NOTE — Progress Notes (Signed)
Nancy [name], Nancy [name] ([id number]) Visit Report for 10/04/2021 Allergy List Details Patient Name: Date of Service: Nancy [name] 10/04/2021 9:00 A M Medical Record Number: [id number] Patient Account Number: [id number] Date of Birth/Sex: Treating RN: 1961/06/25 (60 y.o. Tonita Phoenix, Lauren Primary Care Nabeeha Badertscher: [name] Other Clinician: Referring Obinna Ehresman: Treating Caspian Deleonardis/Extender: Nyra Market in Treatment: 0 Allergies Active Allergies Sulfa (Sulfonamide Antibiotics) hydralazine hydrocodone metformin lettuce hydroxychloroquine sulfate Shellfish Containing Products shrimp metformin Allergy Notes Electronic Signature(s) Signed: 10/10/2021 5:23:14 PM By: Rhae Hammock RN Entered By: Rhae Hammock on 10/04/2021 09:09:10 -------------------------------------------------------------------------------- Arrival Information Details Patient Name: Date of Service: Nancy Faythe Ghee NICA H. 10/04/2021 9:00 A M Medical Record Number: [id number] Patient Account Number: [id number] Date of Birth/Sex: Treating RN: 1960-12-30 (60 y.o. Tonita Phoenix, Lauren Primary Care Renold Kozar: [name] Other Clinician: Referring Jovanie Verge: Treating Mckinsley Koelzer/Extender: Nyra Market in Treatment: 0 Visit Information Patient Arrived: Wheel Chair Arrival Time: 09:02 Accompanied By: husband Transfer Assistance: Manual Patient Identification Verified: Yes Secondary Verification Process Completed: Yes Patient Requires Transmission-Based Precautions: No Patient Has Alerts: Yes Patient Alerts: ABI's: 09/22 R:0.45 L0.5 Electronic Signature(s) Signed: 10/10/2021 5:23:14 PM By: Rhae Hammock RN Entered By: Rhae Hammock on 10/04/2021 09:04:22 -------------------------------------------------------------------------------- Clinic Level of Care Assessment Details Patient Name: Date of Service: Nancy Nancy Sewer ERO NICA  H. 10/04/2021 9:00 A M Medical Record Number: [id number] Patient Account Number: [id number] Date of Birth/Sex: Treating RN: 1961-05-16 (60 y.o. Tonita Phoenix, Lauren Primary Care Larissa Pegg: [name] Other Clinician: Referring Karem Farha: Treating Saidee Geremia/Extender: Nyra Market in Treatment: 0 Clinic Level of Care Assessment Items TOOL 4 Quantity Score X- 1 0 Use when only an EandM is performed on FOLLOW-UP visit ASSESSMENTS - Nursing Assessment / Reassessment X- 1 10 Reassessment of Co-morbidities (includes updates in patient status) X- 1 5 Reassessment of Adherence to Treatment Plan ASSESSMENTS - Wound and Skin A ssessment / Reassessment []  - 0 Simple Wound Assessment / Reassessment - one wound X- 3 5 Complex Wound Assessment / Reassessment - multiple wounds []  - 0 Dermatologic / Skin Assessment (not related to wound area) ASSESSMENTS - Focused Assessment X- 1 5 Circumferential Edema Measurements - multi extremities []  - 0 Nutritional Assessment / Counseling / Intervention []  - 0 Lower Extremity Assessment (monofilament, tuning fork, pulses) []  - 0 Peripheral Arterial Disease Assessment (using hand held doppler) ASSESSMENTS - Ostomy and/or Continence Assessment and Care []  - 0 Incontinence Assessment and Management []  - 0 Ostomy Care Assessment and Management (repouching, etc.) PROCESS - Coordination of Care []  - 0 Simple Patient / Family Education for ongoing care X- 1 20 Complex (extensive) Patient / Family Education for ongoing care X- 1 10 Staff obtains Programmer, systems, Records, T Results / Process Orders est X- 1 10 Staff telephones HHA, Nursing Homes / Clarify orders / etc []  - 0 Routine Transfer to another Facility (non-emergent condition) []  - 0 Routine Hospital Admission (non-emergent condition) X- 1 15 New Admissions / Biomedical engineer / Ordering NPWT Apligraf, etc. , []  - 0 Emergency Hospital Admission (emergent  condition) []  - 0 Simple Discharge Coordination X- 1 15 Complex (extensive) Discharge Coordination PROCESS - Special Needs []  - 0 Pediatric / Minor Patient Management []  - 0 Isolation Patient Management []  - 0 Hearing / Language / Visual special needs []  - 0 Assessment of Community assistance (transportation, D/C planning, etc.) []  - 0 Additional assistance / Altered mentation []  - 0 Support Surface(s) Assessment (bed, cushion,  seat, etc.) INTERVENTIONS - Wound Cleansing / Measurement []  - 0 Simple Wound Cleansing - one wound X- 3 5 Complex Wound Cleansing - multiple wounds X- 1 5 Wound Imaging (photographs - any number of wounds) []  - 0 Wound Tracing (instead of photographs) []  - 0 Simple Wound Measurement - one wound X- 3 5 Complex Wound Measurement - multiple wounds INTERVENTIONS - Wound Dressings []  - 0 Small Wound Dressing one or multiple wounds []  - 0 Medium Wound Dressing one or multiple wounds X- 3 20 Large Wound Dressing one or multiple wounds X- 1 5 Application of Medications - topical []  - 0 Application of Medications - injection INTERVENTIONS - Miscellaneous []  - 0 External ear exam []  - 0 Specimen Collection (cultures, biopsies, blood, body fluids, etc.) []  - 0 Specimen(s) / Culture(s) sent or taken to Lab for analysis []  - 0 Patient Transfer (multiple staff / Civil Service fast streamer / Similar devices) []  - 0 Simple Staple / Suture removal (25 or less) []  - 0 Complex Staple / Suture removal (26 or more) []  - 0 Hypo / Hyperglycemic Management (close monitor of Blood Glucose) []  - 0 Ankle / Brachial Index (ABI) - do not check if billed separately X- 1 5 Vital Signs Has the patient been seen at the hospital within the last three years: Yes Total Score: 210 Level Of Care: New/Established - Level 5 Electronic Signature(s) Signed: 10/10/2021 5:23:14 PM By: Rhae Hammock RN Entered By: Rhae Hammock on 10/04/2021  11:50:44 -------------------------------------------------------------------------------- Encounter Discharge Information Details Patient Name: Date of Service: Nancy Faythe Ghee NICA H. 10/04/2021 9:00 A M Medical Record Number: [id number] Patient Account Number: [id number] Date of Birth/Sex: Treating RN: September 03, 1961 (60 y.o. Tonita Phoenix, Lauren Primary Care Raley Novicki: [name] Other Clinician: Referring Jaslynn Thome: Treating Errin Chewning/Extender: Nyra Market in Treatment: 0 Encounter Discharge Information Items Discharge Condition: Stable Ambulatory Status: Wheelchair Discharge Destination: Home Transportation: Private Auto Accompanied By: Harrel Carina Schedule Follow-up Appointment: Yes Clinical Summary of Care: Patient Declined Electronic Signature(s) Signed: 10/10/2021 5:23:14 PM By: Rhae Hammock RN Entered By: Rhae Hammock on 10/04/2021 11:51:24 -------------------------------------------------------------------------------- Lower Extremity Assessment Details Patient Name: Date of Service: Nancy [place]. 10/04/2021 9:00 A M Medical Record Number: [id number] Patient Account Number: [id number] Date of Birth/Sex: Treating RN: [birth year] (61 y.o. Tonita Phoenix, Lauren Primary Care Alishea Beaudin: [name] Other Clinician: Referring Darlys Buis: Treating Haedyn Ancrum/Extender: Nyra Market in Treatment: 0 Edema Assessment Assessed: Shirlyn Goltz: Yes] Patrice Paradise: No] Edema: [Left: Ye] [Right: s] Calf Left: Right: Point of Measurement: 33 cm From Medial Instep 49 cm Ankle Left: Right: Point of Measurement: 10 cm From Medial Instep 29.4 cm Vascular Assessment Pulses: Dorsalis Pedis Palpable: [Left:Yes] Posterior Tibial Palpable: [Left:Yes] Electronic Signature(s) Signed: 10/10/2021 5:23:14 PM By: Rhae Hammock RN Entered By: Rhae Hammock on 10/04/2021  09:17:13 -------------------------------------------------------------------------------- Multi Wound Chart Details Patient Name: Date of Service: Nancy [place]. 10/04/2021 9:00 A M Medical Record Number: [id number] Patient Account Number: [id number] Date of Birth/Sex: Treating RN: August 14, 1961 (60 y.o. Nancy Fetter Primary Care Keonda Dow: [name] Other Clinician: Referring Jigar Zielke: Treating Jakylan Ron/Extender: Nyra Market in Treatment: 0 Vital Signs Height(in): 63 Capillary Blood Glucose(mg/dl): 178 Weight(lbs): 232 Pulse(bpm): 43 Body Mass Index(BMI): 29 Blood Pressure(mmHg): 174/74 Temperature(F): 98.9 Respiratory Rate(breaths/min): 17 Photos: Left, Dorsal Foot Left Calcaneus Left, Lateral Foot Wound Location: Gradually Appeared Gradually Appeared Gradually Appeared Wounding Event: Diabetic Wound/Ulcer of the Lower Diabetic Wound/Ulcer of the Lower Diabetic Wound/Ulcer  of the Lower Primary Etiology: Extremity Extremity Extremity Congestive Heart Failure, Congestive Heart Failure, Congestive Heart Failure, Comorbid History: Hypertension, Peripheral Arterial Hypertension, Peripheral Arterial Hypertension, Peripheral Arterial Disease, Type II Diabetes, Dementia, Disease, Type II Diabetes, Dementia, Disease, Type II Diabetes, Dementia, Neuropathy Neuropathy Neuropathy 09/04/2021 08/04/2021 08/04/2021 Date Acquired: 0 0 0 Weeks of Treatment: Open Open Open Wound Status: 1x4.3x0.1 5.8x5x0.1 2x3.2x0.1 Measurements L x W x D (cm) 3.377 22.777 5.027 A (cm) : rea 0.338 2.278 0.503 Volume (cm) : 0.00% 0.00% 0.00% % Reduction in A rea: 0.00% 0.00% 0.00% % Reduction in Volume: Unable to visualize wound bed Unable to visualize wound bed Unable to visualize wound bed Classification: Medium Medium Medium Exudate A mount: Serosanguineous Serosanguineous Serosanguineous Exudate Type: red, brown red, brown red,  brown Exudate Color: Yes Yes No Foul Odor A Cleansing: fter No No N/A Odor A nticipated Due to Product Use: Distinct, outline attached Distinct, outline attached Distinct, outline attached Wound Margin: None Present (0%) None Present (0%) None Present (0%) Granulation A mount: Large (67-100%) Large (67-100%) Large (67-100%) Necrotic A mount: Eschar, Adherent Slough Eschar, Adherent Slough Eschar, Adherent Slough Necrotic Tissue: Fascia: No Fascia: No Fascia: No Exposed Structures: Fat Layer (Subcutaneous Tissue): No Fat Layer (Subcutaneous Tissue): No Fat Layer (Subcutaneous Tissue): No Tendon: No Tendon: No Tendon: No Muscle: No Muscle: No Muscle: No Joint: No Joint: No Joint: No Bone: No Bone: No Bone: No None None None Epithelialization: Treatment Notes Electronic Signature(s) Signed: 10/04/2021 4:24:04 PM By: Linton Ham MD Signed: 10/04/2021 5:56:18 PM By: Levan Hurst RN, BSN Entered By: Linton Ham on 10/04/2021 10:04:00 -------------------------------------------------------------------------------- Multi-Disciplinary Care Plan Details Patient Name: Date of Service: Nancy Nancy Sewer ERO NICA H. 10/04/2021 9:00 A M Medical Record Number: [id number] Patient Account Number: [id number] Date of Birth/Sex: Treating RN: 09-Apr-1961 (60 y.o. Tonita Phoenix, Lauren Primary Care Lopez Dentinger: [name] Other Clinician: Referring Inza Mikrut: Treating Abby Tucholski/Extender: Nyra Market in Treatment: 0 Active Inactive Orientation to the Wound Care Program Nursing Diagnoses: Knowledge deficit related to the wound healing center program Goals: Patient/caregiver will verbalize understanding of the [place] Date Initiated: 10/04/2021 Target Resolution Date: 10/20/2021 Goal Status: Active Interventions: Provide education on orientation to the wound center Notes: Wound/Skin Impairment Nursing Diagnoses: Impaired  tissue integrity Knowledge deficit related to ulceration/compromised skin integrity Goals: Patient will have a decrease in wound volume by X% from date: (specify in notes) Date Initiated: 10/04/2021 Target Resolution Date: 10/20/2021 Goal Status: Active Patient/caregiver will verbalize understanding of skin care regimen Date Initiated: 10/04/2021 Target Resolution Date: 10/20/2021 Goal Status: Active Ulcer/skin breakdown will have a volume reduction of 30% by week 4 Date Initiated: 10/04/2021 Target Resolution Date: 10/20/2021 Goal Status: Active Interventions: Assess patient/caregiver ability to obtain necessary supplies Assess patient/caregiver ability to perform ulcer/skin care regimen upon admission and as needed Assess ulceration(s) every visit Notes: Electronic Signature(s) Signed: 10/10/2021 5:23:14 PM By: Rhae Hammock RN Entered By: Rhae Hammock on 10/04/2021 10:05:50 -------------------------------------------------------------------------------- Pain Assessment Details Patient Name: Date of Service: Nancy Faythe Ghee NICA H. 10/04/2021 9:00 A M Medical Record Number: [id number] Patient Account Number: [id number] Date of Birth/Sex: Treating RN: 1961/11/21 (60 y.o. Tonita Phoenix, Lauren Primary Care Trampas Stettner: [name] Other Clinician: Referring Odarius Dines: Treating Tifanny Dollens/Extender: Nyra Market in Treatment: 0 Active Problems Location of Pain Severity and Description of Pain Patient Has Paino No Site Locations Pain Management and Medication Current Pain Management: Electronic Signature(s) Signed: 10/10/2021 5:23:14 PM By: Rhae Hammock RN  Entered By: Rhae Hammock on 10/04/2021 09:11:21 -------------------------------------------------------------------------------- Patient/Caregiver Education Details Patient Name: Date of Service: Nancy [name] 10/19/2022andnbsp9:00 [place] Record Number:  [id number] Patient Account Number: [id number] Date of Birth/Gender: Treating RN: [birth year] (60 y.o. Tonita Phoenix, Lauren Primary Care Physician: [name] Other Clinician: Referring Physician: Treating Physician/Extender: Nyra Market in Treatment: 0 Education Assessment Education Provided To: Patient Education Topics Provided Welcome T [facility]: o Methods: Explain/Verbal Responses: Reinforcements needed, State content correctly Electronic Signature(s) Signed: 10/10/2021 5:23:14 PM By: Rhae Hammock RN Entered By: Rhae Hammock on 10/04/2021 10:05:58 -------------------------------------------------------------------------------- Wound Assessment Details Patient Name: Date of Service: [place]. 10/04/2021 9:00 A M Medical Record Number: [id number] Patient Account Number: [id number] Date of Birth/Sex: Treating RN: [birth year] (60 y.o. Tonita Phoenix, Lauren Primary Care Sollie Vultaggio: [name] Other Clinician: Referring Zulay Corrie: Treating Michiel Sivley/Extender: Nyra Market in Treatment: 0 Wound Status Wound Number: 1 Primary Diabetic Wound/Ulcer of the Lower Extremity Etiology: Wound Location: Left, Dorsal Foot Wound Open Wounding Event: Gradually Appeared Status: Date Acquired: 09/04/2021 Comorbid Congestive Heart Failure, Hypertension, Peripheral Arterial Weeks Of Treatment: 0 History: Disease, Type II Diabetes, Dementia, Neuropathy Clustered Wound: No Photos Wound Measurements Length: (cm) 1 Width: (cm) 4.3 Depth: (cm) 0.1 Area: (cm) 3.377 Volume: (cm) 0.338 % Reduction in Area: 0% % Reduction in Volume: 0% Epithelialization: None Tunneling: No Undermining: No Wound Description Classification: Unable to visualize wound bed Wound Margin: Distinct, outline attached Exudate Amount: Medium Exudate Type: Serosanguineous Exudate Color: red, brown Foul Odor After  Cleansing: Yes Due to Product Use: No Slough/Fibrino Yes Wound Bed Granulation Amount: None Present (0%) Exposed Structure Necrotic Amount: Large (67-100%) Fascia Exposed: No Necrotic Quality: Eschar, Adherent Slough Fat Layer (Subcutaneous Tissue) Exposed: No Tendon Exposed: No Muscle Exposed: No Joint Exposed: No Bone Exposed: No Treatment Notes Wound #1 (Foot) Wound Laterality: Dorsal, Left Cleanser Normal Saline Discharge Instruction: Cleanse the wound with Normal Saline prior to applying a clean dressing using gauze sponges, not tissue or cotton balls. Soap and Water Discharge Instruction: May shower and wash wound with dial antibacterial soap and water prior to dressing change. Peri-Wound Care Topical Primary Dressing Promogran Prisma Matrix, 4.34 (sq in) (silver collagen) Discharge Instruction: Moisten collagen with saline or hydrogel Secondary Dressing Woven Gauze Sponge, Non-Sterile 4x4 in Discharge Instruction: Apply over primary dressing as directed. ABD Pad, 5x9 Discharge Instruction: Apply over primary dressing as directed. Secured With [employer], 4.5x3.1 (in/yd) Discharge Instruction: Secure with Kerlix as directed. 37M Medipore H Soft Cloth Surgical T ape, 4 x 10 (in/yd) Discharge Instruction: Secure with tape as directed. Compression Wrap Compression Stockings Add-Ons Electronic Signature(s) Signed: 10/04/2021 5:56:18 PM By: Levan Hurst RN, BSN Signed: 10/10/2021 5:23:14 PM By: Rhae Hammock RN Entered By: Levan Hurst on 10/04/2021 09:25:04 -------------------------------------------------------------------------------- Wound Assessment Details Patient Name: Date of Service: Nancy Nancy Sewer ERO NICA H. 10/04/2021 9:00 A M Medical Record Number: [id number] Patient Account Number: [id number] Date of Birth/Sex: Treating RN: 1961/03/02 (60 y.o. Tonita Phoenix, Lauren Primary Care Ronney Honeywell: [name] Other Clinician: Referring  Evely Gainey: Treating Breion Novacek/Extender: Nyra Market in Treatment: 0 Wound Status Wound Number: 2 Primary Diabetic Wound/Ulcer of the Lower Extremity Etiology: Wound Location: Left Calcaneus Wound Open Wounding Event: Gradually Appeared Status: Date Acquired: 08/04/2021 Comorbid Congestive Heart Failure, Hypertension, Peripheral Arterial Weeks Of Treatment: 0 History: Disease, Type II Diabetes, Dementia, Neuropathy Clustered Wound: No Photos Wound Measurements Length: (cm)  5.8 Width: (cm) 5 Depth: (cm) 0.1 Area: (cm) 22.777 Volume: (cm) 2.278 % Reduction in Area: 0% % Reduction in Volume: 0% Epithelialization: None Tunneling: No Undermining: No Wound Description Classification: Unable to visualize wound bed Wound Margin: Distinct, outline attached Exudate Amount: Medium Exudate Type: Serosanguineous Exudate Color: red, brown Foul Odor After Cleansing: Yes Due to Product Use: No Slough/Fibrino Yes Wound Bed Granulation Amount: None Present (0%) Exposed Structure Necrotic Amount: Large (67-100%) Fascia Exposed: No Necrotic Quality: Eschar, Adherent Slough Fat Layer (Subcutaneous Tissue) Exposed: No Tendon Exposed: No Muscle Exposed: No Joint Exposed: No Bone Exposed: No Treatment Notes Wound #2 (Calcaneus) Wound Laterality: Left Cleanser Normal Saline Discharge Instruction: Cleanse the wound with Normal Saline prior to applying a clean dressing using gauze sponges, not tissue or cotton balls. Soap and Water Discharge Instruction: May shower and wash wound with dial antibacterial soap and water prior to dressing change. Wound Cleanser Discharge Instruction: Cleanse the wound with wound cleanser prior to applying a clean dressing using gauze sponges, not tissue or cotton balls. Peri-Wound Care Topical Primary Dressing betadine Discharge Instruction: Apply betadine to wound areas daily Secondary Dressing Woven Gauze Sponge, Non-Sterile  4x4 in Discharge Instruction: Apply over primary dressing as directed. ABD Pad, 5x9 Discharge Instruction: Apply over primary dressing as directed. Secured With [employer], 4.5x3.1 (in/yd) Discharge Instruction: Secure with Kerlix as directed. 85M Medipore H Soft Cloth Surgical T ape, 4 x 10 (in/yd) Discharge Instruction: Secure with tape as directed. Compression Wrap Compression Stockings Add-Ons Electronic Signature(s) Signed: 10/04/2021 5:56:18 PM By: Levan Hurst RN, BSN Signed: 10/10/2021 5:23:14 PM By: Rhae Hammock RN Entered By: Levan Hurst on 10/04/2021 09:26:17 -------------------------------------------------------------------------------- Wound Assessment Details Patient Name: Date of Service: Nancy Faythe Ghee NICA H. 10/04/2021 9:00 A M Medical Record Number: [id number] Patient Account Number: [id number] Date of Birth/Sex: Treating RN: 06/18/1961 (60 y.o. Tonita Phoenix, Lauren Primary Care Thera Basden: [name] Other Clinician: Referring Ola Fawver: Treating Freyja Govea/Extender: Nyra Market in Treatment: 0 Wound Status Wound Number: 3 Primary Diabetic Wound/Ulcer of the Lower Extremity Etiology: Wound Location: Left, Lateral Foot Wound Open Wounding Event: Gradually Appeared Status: Date Acquired: 08/04/2021 Comorbid Congestive Heart Failure, Hypertension, Peripheral Arterial Weeks Of Treatment: 0 History: Disease, Type II Diabetes, Dementia, Neuropathy Clustered Wound: No Photos Wound Measurements Length: (cm) 2 Width: (cm) 3.2 Depth: (cm) 0.1 Area: (cm) 5.027 Volume: (cm) 0.503 % Reduction in Area: 0% % Reduction in Volume: 0% Epithelialization: None Tunneling: No Undermining: No Wound Description Classification: Unable to visualize wound bed Wound Margin: Distinct, outline attached Exudate Amount: Medium Exudate Type: Serosanguineous Exudate Color: red, brown Foul Odor After Cleansing:  No Slough/Fibrino Yes Wound Bed Granulation Amount: None Present (0%) Exposed Structure Necrotic Amount: Large (67-100%) Fascia Exposed: No Necrotic Quality: Eschar, Adherent Slough Fat Layer (Subcutaneous Tissue) Exposed: No Tendon Exposed: No Muscle Exposed: No Joint Exposed: No Bone Exposed: No Treatment Notes Wound #3 (Foot) Wound Laterality: Left, Lateral Cleanser Normal Saline Discharge Instruction: Cleanse the wound with Normal Saline prior to applying a clean dressing using gauze sponges, not tissue or cotton balls. Soap and Water Discharge Instruction: May shower and wash wound with dial antibacterial soap and water prior to dressing change. Wound Cleanser Discharge Instruction: Cleanse the wound with wound cleanser prior to applying a clean dressing using gauze sponges, not tissue or cotton balls. Peri-Wound Care Topical Primary Dressing betadine Discharge Instruction: Apply betadine to wound areas daily Secondary Dressing Woven Gauze Sponge, Non-Sterile 4x4 in Discharge Instruction: Apply  over primary dressing as directed. ABD Pad, 5x9 Discharge Instruction: Apply over primary dressing as directed. Secured With [employer], 4.5x3.1 (in/yd) Discharge Instruction: Secure with Kerlix as directed. 53M Medipore H Soft Cloth Surgical T ape, 4 x 10 (in/yd) Discharge Instruction: Secure with tape as directed. Compression Wrap Compression Stockings Add-Ons Electronic Signature(s) Signed: 10/04/2021 5:56:18 PM By: Levan Hurst RN, BSN Signed: 10/10/2021 5:23:14 PM By: Rhae Hammock RN Entered By: Levan Hurst on 10/04/2021 09:27:16 -------------------------------------------------------------------------------- Vitals Details Patient Name: Date of Service: Nancy Nancy Sewer ERO NICA H. 10/04/2021 9:00 A M Medical Record Number: [id number] Patient Account Number: [id number] Date of Birth/Sex: Treating RN: 02-Apr-1961 (60 y.o. Tonita Phoenix, Lauren Primary  Care Lennie Dunnigan: [name] Other Clinician: Referring Leandro Berkowitz: Treating Bill Mcvey/Extender: Nyra Market in Treatment: 0 Vital Signs Time Taken: 09:04 Temperature (F): 98.9 Height (in): 63 Pulse (bpm): 84 Source: Stated Respiratory Rate (breaths/min): 17 Weight (lbs): 232 Blood Pressure (mmHg): 174/74 Source: Stated Capillary Blood Glucose (mg/dl): 178 Body Mass Index (BMI): 41.1 Reference Range: 80 - 120 mg / dl Electronic Signature(s) Signed: 10/10/2021 5:23:14 PM By: Rhae Hammock RN Entered By: Rhae Hammock on 10/04/2021 09:05:04

## 2021-10-10 NOTE — Progress Notes (Signed)
Nancy [name], Nancy [name] ([id number]) Visit Report for 10/04/2021 Abuse/Suicide Risk Screen Details Patient Name: Date of Service: [name] 10/04/2021 9:00 A M Medical Record Number: [id number] Patient Account Number: [id number] Date of Birth/Sex: Treating RN: 1961-11-05 (60 y.o. Nancy [name], Nancy [name] Primary Care Nancy [name]: Nancy [name] Other Clinician: Referring Nancy [name]: Treating Nancy [name]/Extender: Nancy [name] in Treatment: 0 Abuse/Suicide Risk Screen Items Answer ABUSE RISK SCREEN: Has anyone close to you tried to hurt or harm you recentlyo No Do you feel uncomfortable with anyone in your familyo No Has anyone forced you do things that you didnt want to doo No Electronic Signature(s) Signed: 10/10/2021 5:23:14 PM By: Rhae Hammock RN Entered By: Rhae Hammock on 10/04/2021 09:09:19 -------------------------------------------------------------------------------- Activities of Daily Living Details Patient Name: Date of Service: [name] 10/04/2021 9:00 A M Medical Record Number: [id number] Patient Account Number: [id number] Date of Birth/Sex: Treating RN: Oct 18, 1961 (60 y.o. Nancy [name], Nancy [name] Primary Care Nancy [name]: Nancy [name] Other Clinician: Referring Nancy [name]: Treating Nancy [name]/Extender: Nancy [name] in Treatment: 0 Activities of Daily Living Items Answer Activities of Daily Living (Please select one for each item) Drive Automobile Not Able T Medications ake Need Assistance Use T elephone Need Assistance Care for Appearance Need Assistance Use T oilet Need Assistance Bath / Shower Need Assistance Dress Self Need Assistance Feed Self Completely Able Walk Need Assistance Get In / Out Bed Need Assistance Housework Need Assistance Prepare Meals Need Assistance Handle Money Need Assistance Shop for Self Need Assistance Electronic Signature(s) Signed: 10/10/2021 5:23:14  PM By: Rhae Hammock RN Entered By: Rhae Hammock on 10/04/2021 09:10:06 -------------------------------------------------------------------------------- Education Screening Details Patient Name: Date of Service: MO Nancy Ghee [facility]. 10/04/2021 9:00 A M Medical Record Number: [id number] Patient Account Number: [id number] Date of Birth/Sex: Treating RN: December 21, 1960 (60 y.o. Nancy [name], Nancy [name] Primary Care Nancy [name]: Nancy [name] Other Clinician: Referring Nancy [name]: Treating Nancy [name]/Extender: Nancy [name] in Treatment: 0 Primary Learner Assessed: Caregiver dementia Reason Patient is not Primary Learner: Nancy [name] Learning Preferences/Education Level/Primary Language Learning Preference: Explanation, Demonstration, Communication Board, Printed Material Highest Education Level: High School Preferred Language: English Cognitive Barrier Language Barrier: No Translator Needed: No Memory Deficit: No Emotional Barrier: No Cultural/Religious Beliefs Affecting Medical Care: No Physical Barrier Impaired Vision: No Impaired Hearing: Yes hoh Decreased Hand dexterity: No Knowledge/Comprehension Knowledge Level: High Comprehension Level: High Ability to understand written instructions: High Ability to understand verbal instructions: High Motivation Anxiety Level: Calm Cooperation: Cooperative Education Importance: Denies Need Interest in Health Problems: Asks Questions Perception: Coherent Willingness to Engage in Self-Management High Activities: Readiness to Engage in Self-Management High Activities: Electronic Signature(s) Signed: 10/10/2021 5:23:14 PM By: Rhae Hammock RN Entered By: Rhae Hammock on 10/04/2021 09:10:56 -------------------------------------------------------------------------------- Fall Risk Assessment Details Patient Name: Date of Service: [name] 10/04/2021 9:00 A M Medical Record  Number: [id number] Patient Account Number: [id number] Date of Birth/Sex: Treating RN: 03-18-1961 (60 y.o. Nancy [name], Nancy [name] Primary Care Nancy [name]: Nancy [name] Other Clinician: Referring Jaggar [name]: Treating Nancy [name]/Extender: Nancy [name] in Treatment: 0 Fall Risk Assessment Items Have you had 2 or more falls in the last 12 monthso 0 No Have you had any fall that resulted in injury in the last 12 monthso 0 No FALLS RISK SCREEN History of falling - immediate or within 3 months 0 No Secondary diagnosis (Do you have 2 or more medical diagnoseso) 0 No Ambulatory aid None/bed rest/wheelchair/nurse 0  No Crutches/cane/walker 0 No Furniture 0 No Intravenous therapy Access/Saline/Heparin Lock 0 No Gait/Transferring Normal/ bed rest/ wheelchair 0 No Weak (short steps with or without shuffle, stooped but able to lift head while walking, may seek 0 No support from furniture) Impaired (short steps with shuffle, may have difficulty arising from chair, head down, impaired 0 No balance) Mental Status Oriented to own ability 0 No Electronic Signature(s) Signed: 10/10/2021 5:23:14 PM By: Rhae Hammock RN Entered By: Rhae Hammock on 10/04/2021 09:11:05 -------------------------------------------------------------------------------- Foot Assessment Details Patient Name: Date of Service: [name] 10/04/2021 9:00 A M Medical Record Number: [id number] Patient Account Number: [id number] Date of Birth/Sex: Treating RN: 02-Sep-1961 (61 y.o. Nancy [name], Nancy [name] Primary Care Nancy [name]: Nancy [name] Other Clinician: Referring Nancy [name]: Treating Nancy [name]/Extender: Nancy [name] in Treatment: 0 Foot Assessment Items Site Locations + = Sensation present, - = Sensation absent, C = Callus, U = Ulcer R = Redness, W = Warmth, M = Maceration, PU = Pre-ulcerative lesion F = Fissure, S = Swelling, D =  Dryness Assessment Right: Left: Other Deformity: No No Prior Foot Ulcer: No Yes Prior Amputation: No No Charcot Joint: No No Ambulatory Status: Ambulatory With Help Assistance Device: Wheelchair Gait: Administrator, arts) Signed: 10/10/2021 5:23:14 PM By: Rhae Hammock RN Entered By: Rhae Hammock on 10/04/2021 09:51:20 -------------------------------------------------------------------------------- Nutrition Risk Screening Details Patient Name: Date of Service: MO [place]. 10/04/2021 9:00 [place] Record Number: [id number] Patient Account Number: [id number] Date of Birth/Sex: Treating RN: 1961-07-30 (60 y.o. Nancy [name], Nancy [name] Primary Care [place] Mielke: Nancy [name] Other Clinician: Referring Burrel Legrand: Treating Kamla Skilton/Extender: Nancy [name] in Treatment: 0 Height (in): 63 Weight (lbs): 232 Body Mass Index (BMI): 41.1 Nutrition Risk Screening Items Score Screening NUTRITION RISK SCREEN: I have an illness or condition that made me change the kind and/or amount of food I eat 0 No I eat fewer than two meals per day 0 No I eat few fruits and vegetables, or milk products 0 No I have three or more drinks of beer, liquor or wine almost every day 0 No I have tooth or mouth problems that make it hard for me to eat 0 No I don't always have enough money to buy the food I need 0 No I eat alone most of the time 0 No I take three or more different prescribed or over-the-counter drugs a day 0 No Without wanting to, I have lost or gained 10 pounds in the last six months 0 No I am not always physically able to shop, cook and/or feed myself 0 No Nutrition Protocols Good Risk Protocol 0 No interventions needed Moderate Risk Protocol High Risk Proctocol Risk Level: Good Risk Score: 0 Electronic Signature(s) Signed: 10/10/2021 5:23:14 PM By: Rhae Hammock RN Entered By: Rhae Hammock on 10/04/2021 09:11:10

## 2021-10-10 NOTE — TOC CM/SW Note (Signed)
HF TOC CM did speak to [facility], rep, Levada Dy and pt is active. They will resume care. [place], Heart Failure TOC CM [phone]

## 2021-10-10 NOTE — Evaluation (Addendum)
Physical Therapy Evaluation Patient Details Name: Nancy [name] MRN: [id number] DOB: 09/20/1961 Today's Date: 10/10/2021  History of Present Illness  60 y.o. female admitted on 10/20 with worsening SOB and BLE edema. Patient admitted with acute on chronic diastolic CHF and hypertensive emergency. Previous admission 9/19 with necrotic L heel, ulceration and possible cellulitis/myositis followed by outpatient wound clinic. Patient also with admission ~1 month ago with CHF exacerbation. PMHx significant for anemia, depression, DMII with peripheral neuropathy, HLD, HTN, CVA, PAD, CHF, HOH and CKD.  Clinical Impression  Pt doing well with mobility and ready for DC home with resumption of HH services.         Recommendations for follow up therapy are one component of a multi-disciplinary discharge planning process, led by the attending physician.  Recommendations may be updated based on patient status, additional functional criteria and insurance authorization.  Follow Up Recommendations Home health PT    Assistance Recommended at Discharge Intermittent Supervision/Assistance  Functional Status Assessment Patient has had a recent decline in their functional status and demonstrates the ability to make significant improvements in function in a reasonable and predictable amount of time.  Equipment Recommendations  None recommended by PT    Recommendations for Other Services       Precautions / Restrictions Precautions Precautions: Fall;Other (comment) Precaution Comments: Urine incontinence, L foot wound with foul odor Required Braces or Orthoses: Other Brace Other Brace: L foot PRAFO with ambulation Restrictions Weight Bearing Restrictions: Yes LLE Weight Bearing: Weight bearing as tolerated      Mobility  Bed Mobility            General bed mobility comments: Pt up in recliner    Transfers Overall transfer level: Needs assistance Equipment used: Rolling walker (2  wheels) Transfers: Sit to/from Stand Sit to Stand: Min guard           General transfer comment: Assist for safety    Ambulation/Gait Ambulation/Gait assistance: Supervision Gait Distance (Feet): 130 Feet Assistive device: Rolling walker (2 wheels) Gait Pattern/deviations: Step-to pattern;Decreased weight shift to left Gait velocity: Decreased Gait velocity interpretation: 1.31 - 2.62 ft/sec, indicative of limited community ambulator General Gait Details: Steady gait with rolling walker with supervision for safety and lines  Stairs            Wheelchair Mobility    Modified Rankin (Stroke Patients Only)       Balance Overall balance assessment: Needs assistance Sitting-balance support: Feet supported;No upper extremity supported Sitting balance-Leahy Scale: Good     Standing balance support: No upper extremity supported Standing balance-Leahy Scale: Fair Standing balance comment: Able to maintain static standing at sink level without UE support during grooming tasks. Reliant on BUE support on RW with mobility.                             Pertinent Vitals/Pain Pain Assessment: No/denies pain Pain Intervention(s): Monitored during session    Home Living Family/patient expects to be discharged to:: Other (Comment) ([employer] with spouse) Living Arrangements: Spouse/significant other Available Help at Discharge: Family;Available PRN/intermittently (Husband works on the other side of town from 3-11pm)           Home Layout: One level Home Equipment: Rolling Walker (2 wheels);Cane - single point;BSC Additional Comments: Active with HHPT PTA.    Prior Function Prior Level of Function : Needs assist       Physical Assist : ADLs (physical)   ADLs (  physical): Dressing;Bathing;IADLs Mobility Comments: RW vs SPC ADLs Comments: Assist for LB bathing/dressing. Has urinary incontinence. Does not use briefs.     Hand Dominance   Dominant Hand:  Right    Extremity/Trunk Assessment   Upper Extremity Assessment Upper Extremity Assessment: Defer to OT evaluation    Lower Extremity Assessment Lower Extremity Assessment: Generalized weakness    Cervical / Trunk Assessment Cervical / Trunk Assessment: Normal  Communication   Communication: No difficulties  Cognition Arousal/Alertness: Awake/alert Behavior During Therapy: Flat affect Overall Cognitive Status: No family/caregiver present to determine baseline cognitive functioning                                   General Comments General comments (skin integrity, edema, etc.): VSS on RA.     Exercises     Assessment/Plan    PT Assessment All further PT needs can be met in the next venue of care  PT Problem List Decreased strength;Decreased balance;Decreased mobility       PT Treatment Interventions      PT Goals (Current goals can be found in the Care Plan section)  Acute Rehab PT Goals PT Goal Formulation: All assessment and education complete, DC therapy    Frequency     Barriers to discharge        Co-evaluation               AM-PAC PT "6 Clicks" Mobility  Outcome Measure Help needed turning from your back to your side while in a flat bed without using bedrails?: None Help needed moving from lying on your back to sitting on the side of a flat bed without using bedrails?: None Help needed moving to and from a bed to a chair (including a wheelchair)?: A Little Help needed standing up from a chair using your arms (e.g., wheelchair or bedside chair)?: A Little Help needed to walk in hospital room?: A Little Help needed climbing 3-5 steps with a railing? : A Little 6 Click Score: 20    End of Session   Activity Tolerance: Patient tolerated treatment well Patient left: in chair;with call bell/phone within reach;with chair alarm set Nurse Communication: Mobility status PT Visit Diagnosis: Other abnormalities of gait and mobility  (R26.89);Muscle weakness (generalized) (M62.81)    Time: 0918-0932 PT Time Calculation (min) (ACUTE ONLY): 14 min   Charges:   PT Evaluation $PT Eval Low Complexity: 1 Low            PT Acute Rehabilitation Services Pager [phone] Office [phone]    W [place] 10/10/2021, 10:10 AM  

## 2021-10-10 NOTE — Telephone Encounter (Signed)
Attempted to reach out to Nancy [name] for setting up paramedicine with no answer on call. I left a voicemail and will continue to follow up.

## 2021-10-11 ENCOUNTER — Telehealth: Payer: Self-pay

## 2021-10-11 NOTE — Telephone Encounter (Signed)
Transition Care Management Follow-up Telephone Call Date of discharge and from where: 10/10/2021, [facility]  How have you been since you were released from the hospital? She said she is feeling okay.  Any questions or concerns? No  Items Reviewed: Did the pt receive and understand the discharge instructions provided? Yes  Medications obtained and verified? Yes - she said she has all medications and did not have any questions about her med regime. She correctly stated the new order for lantus..  Other? No  Any new allergies since your discharge? No  Dietary orders reviewed? Yes Do you have support at home? Yes   Home Care and Equipment/Supplies: Were home health services ordered? yes If so, what is the name of the agency? [facility]  Has the agency set up a time to come to the patient's home? Yes. Visit scheduled for tomorrow.  Were any new equipment or medical supplies ordered?  Yes: wound care supplies  What is the name of the medical supply agency? Not addressed Were you able to get the supplies/equipment? yes Do you have any questions related to the use of the equipment or supplies? No  She stated that she has all wound care supplies and her husband has been doing her dressing changes. She has PRAFO for left foot.   She also has a glucometer but has not checked her blood sugar yet since being home  She has been referred to [employer] [facility].   Functional Questionnaire: (I = Independent and D = Dependent) ADLs: she said her husband has been providing assistance with personal care as needed. Has RW to use with ambulation, WBAT LLE.   She also has a BSC and shower chair. .   Follow up appointments reviewed:  PCP Hospital f/u appt confirmed? Yes  Scheduled to see Dr Wynetta Emery  on 10/20/2021 @ 1050.  She said Fridays are the best day for her appointments and she already has an appt with cardiology 10/13/2021.  [facility] f/u appt confirmed? Yes  Scheduled to  see cardiology on 10/10/2021; wound care- 10/19/2021.  Are transportation arrangements needed? No  If their condition worsens, is the pt aware to call PCP or go to the Emergency Dept.? Yes Was the patient provided with contact information for the PCP's office or ED? Yes Was to pt encouraged to call back with questions or concerns? Yes

## 2021-10-12 NOTE — Progress Notes (Signed)
Advanced Heart Failure Clinic Note   PCP: [name], MD PCP-Cardiologist: Quay Burow, MD  HF Cardiologist: Dr. Aundra Dubin  HPI:   Nancy [name] 60 y.o. AAF w/ chronic diastolic heart failure, Stage IV CKD (baseline SCr ~2-3), HTN, T2DM, HLD, CVA, PVD (carotid artery duplex 3/22: occluded Rt ICA, mod Lt ICA stenosis) and tobacco abuse.   Echo 11/2019: EF 55-60%, RV normal. No LVH. No significant valvular dysfunction   She was referred to Dr. Gwenlyn Found for [facility] by PCP in April 2022. Complained of progressive exertional dyspnea, LEE and orthopnea. Echo 4/22: EF 60-65%, GIIDD, mild LVH, RV normal. NST w/ findings suggestive of possible anterior ischemia vs breast attenuation. R/LHC on 04/27/21 showed mild nonobstructive CAD and RHC hemodynamics c/w pulmonary HTN (see cath data below). Diuretics were increased w/ plans to refer to the [facility]. Dry wt felt to be ~228 lb.   Unfortunately, she has had 3 consecutive re-admissions for a/c dCHF since that time.   Admitted 5/24-5/27=> discharged home on Lasix 40 daily.   Admitted 6/25-6/30 =>SCr bump to 3.2 (2.9 at d/c). Discharged home on Torsemide 40 daily. Echo EF 55-60%, RV normal.   Admitted 07/07/21= > direct admit from Dr. Kennon Holter office w/ marked fluid overload, progressive wt gain, exertional dyspnea, LEE and orthopnea. Clinic BP elevated 172/85. SCr 2.6 on admit. Started on lasix gtt. AHF team consulted to assist with further management. She underwent RHC, suggests ongoing volume overload with elevated PCWP and RA.  Prominent v-waves in PCWP but think due to severe diastolic dysfunction as there is no significant MR on review of recent echo.  PYP study was not suggestive of TTR cardiac amyloidosis.  Suspect severe diastolic dysfunction due to long-standing and poorly controlled HTN. SCr and potassium continued to climb and nephrology consulted. Her diuretics were adjusted and she was able to be discharged on low dose carvedilol and torsemide  40 daily; weight 226 lbs.  Post hospital follow up 08/04/21 she was volume overloaded in the setting of dietary indiscretion. Torsemide increased to 80 mg x 3 days and unna boots ordered.   Seen in ED 08/13/21 with leg swelling. Given IV lasix with good response. Home torsemide was doubled and she was discharged.  Admitted 09/04/21 with a left heel ulcer secondary to diabetic neuropathy.  Work-up included right ABI of 0.45 and local ABI of 0.5.  She was started on antibiotics, no osteomyelitis on MRI. Vascular consulted and did not pursue angiography as it could potentially push her into renal failure and require initiation of HD. Ortho recommended left transtibial amputation  but she declined. Palliative care met with her but she did not want to discuss her health issues. There was no indication for HD, per nephrology, but she will likely need AV fistula and HD soon. She was not interested in this. PT recommended SNF however patient declined. She was discharged with Home Health (she is Medicaid pending, no insurance).   Recently readmitted again earlier this month w/ acute on chronic HFpEF exacerbation w/ acute pulmonary edema in setting of hypertensive urgency. Acute exacerbation felt triggered by poor med compliance and dietary indiscretion w/ sodium + high fluid intake. Echo EF 50-55%, mod LVH, G2DD. Course c/b worsening CKD. SCr bumped to 3.12 (prior baseline ~ 2.5). Nephrology was consulted but felt no emergent indication for HD. She responded well to IV Lasix, though nephrology anticipates that she may bee nearing need for HD soon. Plan is to follow in the outpatient setting. Also noted to have  gangrenous pressure ulcers of bilateral heels left and right dorsal feet. Now followed by wound care. Discharged home on torsemide 60 mg daily. D/c SCr 3.12. D/c wt 231 lb.   Presents to clinic today for post hospital f/u. Here w/ husband. In wheel chair. Volume up. Wt up 7 lb post hospital. 2+ bilateral LEE on  exam. Denies resting dyspnea but SOB w/ activity, NYHA Class II-early III. She reports she just had post hospital f/u w/ [facility] yesterday and they increased her diuretics. She also had repeat blood work and given report that her kidney function was "stable". We have contacted [facility] and have requested copy of progress note and labs be faxed for our review.   Her BP is elevated today but she has not yet taken her am meds. Plans to take when she returns home.   Reports sodium and fluid restriction.   Continues w/ routine wound care for feet. Denies pain. No fever or chills.    ECG: not performed   ROS: All systems reviewed and negative except as per HPI.   Past Medical History:  Diagnosis Date   Anemia 2006   Depression 2014   previously on amitryptiline    Diabetes mellitus with neurological manifestation ([place]) 2006   Diabetic peripheral neuropathy ([place]) 04/26/2020   Fracture of left ankle 1997    Gastroparesis 07/2016   HOH (hard of hearing) 2004    Hyperlipidemia 2006   Hypertension 2006   IBS (irritable bowel syndrome) 2002    Leukopenia 2015    Macular degeneration 11/2019   Shingles 2009    Stroke [facility]) 2020   Thyroid nodule 2004   Current Outpatient Medications  Medication Sig Dispense Refill   acetaminophen (TYLENOL) 500 MG tablet Take 500 mg by mouth every 6 (six) hours as needed for moderate pain or headache.     amLODipine (NORVASC) 10 MG tablet Take 1 tablet (10 mg total) by mouth daily. 30 tablet 3   aspirin 81 MG EC tablet Take 1 tablet (81 mg total) by mouth daily. 60 tablet 1   atorvastatin (LIPITOR) 40 MG tablet Take 1 tablet (40 mg total) by mouth daily. 30 tablet 4   Blood Glucose Monitoring Suppl (ONETOUCH VERIO) w/Device KIT Check blood sugar three times daily. E11.40 1 kit 0   calcium acetate (PHOSLO) 667 MG capsule Take 1 capsule (667 mg total) by mouth 3 (three) times daily with meals. 90 capsule 0   carvedilol (COREG) 12.5 MG tablet Take 1 tablet (12.5 mg  total) by mouth 2 (two) times daily with a meal. 60 tablet 2   DULoxetine (CYMBALTA) 20 MG capsule TAKE 1 CAPSULE (20 MG TOTAL) BY MOUTH DAILY. (Patient not taking: Reported on 10/06/2021) 30 capsule 2   ferrous sulfate 325 (65 FE) MG tablet Take 1 tablet (325 mg total) by mouth daily with breakfast. 100 tablet 1   glucose blood test strip Use as instructedCheck blood sugar three times daily. E11.40 100 each 12   insulin glargine (LANTUS) 100 UNIT/ML Solostar Pen Inject 30 Units into the skin at bedtime. 15 mL 3   insulin lispro (HUMALOG KWIKPEN) 100 UNIT/ML KwikPen Inject 4 units before breakfast and dinner. (Patient taking differently: Inject 4 Units into the skin every evening.) 15 mL 3   Insulin Pen Needle 32G X 4 MM MISC Use to inject insulin as directed. 100 each 0   risperiDONE (RISPERDAL) 3 MG tablet TAKE 1 TABLET (3 MG TOTAL) BY MOUTH AT BEDTIME. (Patient taking differently:  Take 3 mg by mouth at bedtime.) 30 tablet 2   senna-docusate (SENOKOT-S) 8.6-50 MG tablet Take 2 tablets by mouth at bedtime. (Patient not taking: Reported on 10/06/2021) 60 tablet 0   torsemide (DEMADEX) 20 MG tablet Take 3 tablets (60 mg total) by mouth daily. 90 tablet 0   No current facility-administered medications for this visit.   Allergies  Allergen Reactions   Elemental Sulfur Hives and Other (See Comments)    PATIENT STATED THIS, MORE THAN LIKELY, SHOULD HAVE BEEN LOGGED AS "SULFA"   Hydralazine Hcl Other (See Comments)    Hair loss   Hydrocodone Itching and Other (See Comments)    Upset stomach   Metformin And Related Nausea And Vomiting and Other (See Comments)    Stomach pains, also   Other Nausea Only and Other (See Comments)    Lettuce- Does not digest this!!   Plaquenil [Hydroxychloroquine Sulfate] Hives   Shellfish-Derived Products Nausea Only and Other (See Comments)    Caused an upset stomach   Shrimp (Diagnostic) Nausea Only and Other (See Comments)    Upset stomach    Sulfa Antibiotics  Hives   Social History   Socioeconomic History   Marital status: Legally Separated    Spouse name: [name]   Number of children: 1   Years of education: some colle   Highest education level: Not on file  Occupational History   Occupation: Employed FT as Landscape architect    Comment: [employer] , [place]  Tobacco Use   Smoking status: Former    Packs/day: 0.25    Years: 0.50    Pack years: 0.13    Types: Cigarettes   Smokeless tobacco: Never   Tobacco comments:    quit yrs ago  Vaping Use   Vaping Use: Never used  Substance and Sexual Activity   Alcohol use: No   Drug use: No   Sexual activity: Yes    Birth control/protection: None  Other Topics Concern   Not on file  Social History Narrative   02/18/20 lives alone     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    Social Determinants of Health   Financial Resource Strain: Low Risk    Difficulty of Paying Living Expenses: Not very hard  Food Insecurity: No Food Insecurity   Worried About Charity fundraiser in the Last Year: Never true   Ran Out of Food in the Last Year: Never true  Transportation Needs: No Transportation Needs   Lack of Transportation (Medical): No   Lack of Transportation (Non-Medical): No  Physical Activity: Not on file  Stress: Not on file  Social Connections: Not on file  Intimate Partner Violence: Not on file   Family History  Problem Relation Age of Onset   Hypertension Mother    Heart disease Mother    Diabetes Mother    Thyroid disease Mother    Congestive Heart Failure Mother    Breast cancer Maternal Grandmother    Colon cancer Maternal Grandfather    Heart attack Sister    Heart disease Brother    Hyperlipidemia Brother    Hypertension Brother    Diabetes Father    Breast cancer Maternal Aunt    There were no vitals taken for this visit.  Wt Readings from Last 3 Encounters:  10/10/21 105.3 kg  09/26/21 104.2 kg  09/14/21 105.2 kg   PHYSICAL  EXAM: General:  fatigued appearing F in [place]. No respiratory difficulty HEENT:  normal Neck: supple. Thick neck, JVD not well visualized. Carotids 2+ bilat; no bruits. No lymphadenopathy or thyromegaly appreciated. Cor: PMI nondisplaced. Regular rate & rhythm. No rubs, gallops or murmurs. Lungs: decreased BS at the bases bilaterally  Abdomen: obese, soft, nontender, nondistended. No hepatosplenomegaly. No bruits or masses. Good bowel sounds. Extremities: no cyanosis, clubbing, rash, 2+ bilateral LEE up to knees, lt foot in boot  Neuro: alert & oriented x 3, cranial nerves grossly intact. moves all 4 extremities w/o difficulty. Affect pleasant.   ASSESSMENT & PLAN: 1. Acute on chronic diastolic CHF: Echo in 8/26 showed EF 55-60%, moderate LVH, normal RV, dilated IVC.  R/LHC in 5/22 showed nonobstructive CAD, pulmonary hypertension .  Multiple admissions recently with CHF, complicated by CKD stage IV.  RHC on 7/29 suggested ongoing volume overload with elevated PCWP and RA.  Prominent v-waves in PCWP but think due to severe diastolic dysfunction as there is no significant MR on review of recent echo.  PYP study was not suggestive of TTR cardiac amyloidosis.  Suspect severe diastolic dysfunction due to long-standing and poorly controlled HTN.  Recent admit for a/c CHF 10/22 w/ fluid overload, c/b CKD. Volume trending back up post d/c. Wt up 7 lb w/ bilateral LEE. NYHA Class II-early III. Followed closely by Nephrology. No indication for dialysis currently. Continuing w/ diuretics, which was just increased at nephrology office yesterday (on torsemide). Agree that she needs further diuresis. We have contacted [facility] and have requested copy of progress note and labs be faxed for our review.  - Discussed importance of daily weights, limiting fluid to less than 2L/day and limiting high-sodium foods. - Have had discussions regarding Cardiomems. Unfortunately, she is currently not a candidate as she has no insurance.  Suspect she is also likely nearing HD.  2. CAD: LHC on 5/22, she has mild to moderate segmental proximal mid and distal LAD disease. Her circumflex and RCA do not have significant disease. - stable w/o CP - Continue ASA + statin. 3. HLD: On atorvastatin 40. LDL 99, HDL 52 (7/22). 4. HTN: Moderately elevated but hasn't yet taken AM meds. No med adjustments today. Avoid aggressive BP lowing given CKD. - Continue carvedilol 12.5 mg bid. - Continue amlodipine 10 mg daily. 5. CKD stage V: Renal US with no evidence of obstruction. Followed by nephrology. Likely nearing HD soon. Will likely need AV fistula placement soon. She is followed by vascular surgery. 6. Carotid disease: Occluded R ICA, moderate L ICA - Continue ASA, statin. 7. PAD: Now with bilateral gangrenous pressure ulcers, L >R. Evaluated by vascular and orthopedic surgery during recent admission, recommend transtibial amputation however patient declined this, opting for non-surgical healing.  - followed by [facility]. - Continue aspirin + statin. - Prevalon boots to continue offloading heels.  F/u in 3-4 more weeks w/ APP. She has f/u w/ [facility] in 2 months, per pt report.   Lyda Jester, PA-C 10/12/21

## 2021-10-13 ENCOUNTER — Ambulatory Visit (HOSPITAL_COMMUNITY)
Admission: RE | Admit: 2021-10-13 | Discharge: 2021-10-13 | Disposition: A | Discharge status: Home / Self Care | Payer: Medicaid Other | Source: Ambulatory Visit | Attending: Cardiology | Admitting: Cardiology

## 2021-10-13 ENCOUNTER — Other Ambulatory Visit: Payer: Self-pay

## 2021-10-13 ENCOUNTER — Encounter (HOSPITAL_COMMUNITY): Payer: Self-pay

## 2021-10-13 VITALS — BP 160/98 | HR 88 | Wt 238.6 lb

## 2021-10-13 DIAGNOSIS — I272 Pulmonary hypertension, unspecified: Secondary | ICD-10-CM | POA: Diagnosis not present

## 2021-10-13 DIAGNOSIS — Z87891 Personal history of nicotine dependence: Secondary | ICD-10-CM | POA: Diagnosis not present

## 2021-10-13 DIAGNOSIS — I132 Hypertensive heart and chronic kidney disease with heart failure and with stage 5 chronic kidney disease, or end stage renal disease: Secondary | ICD-10-CM | POA: Insufficient documentation

## 2021-10-13 DIAGNOSIS — E785 Hyperlipidemia, unspecified: Secondary | ICD-10-CM | POA: Insufficient documentation

## 2021-10-13 DIAGNOSIS — Z8249 Family history of ischemic heart disease and other diseases of the circulatory system: Secondary | ICD-10-CM | POA: Diagnosis not present

## 2021-10-13 DIAGNOSIS — N185 Chronic kidney disease, stage 5: Secondary | ICD-10-CM | POA: Insufficient documentation

## 2021-10-13 DIAGNOSIS — E1152 Type 2 diabetes mellitus with diabetic peripheral angiopathy with gangrene: Secondary | ICD-10-CM | POA: Insufficient documentation

## 2021-10-13 DIAGNOSIS — Z79899 Other long term (current) drug therapy: Secondary | ICD-10-CM | POA: Diagnosis not present

## 2021-10-13 DIAGNOSIS — Z8673 Personal history of transient ischemic attack (TIA), and cerebral infarction without residual deficits: Secondary | ICD-10-CM | POA: Diagnosis not present

## 2021-10-13 DIAGNOSIS — E1143 Type 2 diabetes mellitus with diabetic autonomic (poly)neuropathy: Secondary | ICD-10-CM | POA: Diagnosis not present

## 2021-10-13 DIAGNOSIS — Z09 Encounter for follow-up examination after completed treatment for conditions other than malignant neoplasm: Secondary | ICD-10-CM | POA: Insufficient documentation

## 2021-10-13 DIAGNOSIS — Z7982 Long term (current) use of aspirin: Secondary | ICD-10-CM | POA: Insufficient documentation

## 2021-10-13 DIAGNOSIS — Z794 Long term (current) use of insulin: Secondary | ICD-10-CM | POA: Insufficient documentation

## 2021-10-13 DIAGNOSIS — I5033 Acute on chronic diastolic (congestive) heart failure: Secondary | ICD-10-CM | POA: Insufficient documentation

## 2021-10-13 DIAGNOSIS — E1122 Type 2 diabetes mellitus with diabetic chronic kidney disease: Secondary | ICD-10-CM | POA: Insufficient documentation

## 2021-10-13 DIAGNOSIS — I5032 Chronic diastolic (congestive) heart failure: Secondary | ICD-10-CM

## 2021-10-13 DIAGNOSIS — E1142 Type 2 diabetes mellitus with diabetic polyneuropathy: Secondary | ICD-10-CM | POA: Diagnosis not present

## 2021-10-13 DIAGNOSIS — I2510 Atherosclerotic heart disease of native coronary artery without angina pectoris: Secondary | ICD-10-CM | POA: Insufficient documentation

## 2021-10-13 NOTE — Patient Instructions (Signed)
Your physician recommends that you schedule a follow-up appointment in: 3-4 weeks  At the [facility], you and your health needs are our priority. As part of our continuing mission to provide you with exceptional heart care, we have created designated Provider Care Teams. These Care Teams include your primary Cardiologist (physician) and Advanced Practice Providers (APPs- Physician Assistants and Nurse Practitioners) who all work together to provide you with the care you need, when you need it.   You may see any of the following providers on your designated Care Team at your next follow up: Dr Glori Bickers Dr Haynes Kerns, NP Lyda Jester, [facility] [place], [place] Audry Riles, PharmD   Please be sure to bring in all your medications bottles to every appointment.    If you have any questions or concerns before your next appointment please send [place] a message through [place] or call our office at [phone].    TO LEAVE A MESSAGE FOR THE NURSE SELECT OPTION 2, PLEASE LEAVE A MESSAGE INCLUDING: YOUR NAME DATE OF BIRTH CALL BACK NUMBER REASON FOR CALL**this is important as we prioritize the call backs  YOU WILL RECEIVE A CALL BACK THE SAME DAY AS LONG AS YOU CALL BEFORE 4:00 PM

## 2021-10-19 ENCOUNTER — Encounter (HOSPITAL_BASED_OUTPATIENT_CLINIC_OR_DEPARTMENT_OTHER): Payer: Medicaid Other | Attending: Internal Medicine | Admitting: Internal Medicine

## 2021-10-19 ENCOUNTER — Telehealth (HOSPITAL_COMMUNITY): Payer: Self-pay

## 2021-10-19 ENCOUNTER — Other Ambulatory Visit: Payer: Self-pay

## 2021-10-19 DIAGNOSIS — I132 Hypertensive heart and chronic kidney disease with heart failure and with stage 5 chronic kidney disease, or end stage renal disease: Secondary | ICD-10-CM | POA: Insufficient documentation

## 2021-10-19 DIAGNOSIS — I509 Heart failure, unspecified: Secondary | ICD-10-CM | POA: Insufficient documentation

## 2021-10-19 DIAGNOSIS — L97428 Non-pressure chronic ulcer of left heel and midfoot with other specified severity: Secondary | ICD-10-CM | POA: Diagnosis not present

## 2021-10-19 DIAGNOSIS — E1140 Type 2 diabetes mellitus with diabetic neuropathy, unspecified: Secondary | ICD-10-CM | POA: Diagnosis not present

## 2021-10-19 DIAGNOSIS — E11621 Type 2 diabetes mellitus with foot ulcer: Secondary | ICD-10-CM | POA: Insufficient documentation

## 2021-10-19 DIAGNOSIS — E1122 Type 2 diabetes mellitus with diabetic chronic kidney disease: Secondary | ICD-10-CM | POA: Diagnosis not present

## 2021-10-19 DIAGNOSIS — L97528 Non-pressure chronic ulcer of other part of left foot with other specified severity: Secondary | ICD-10-CM | POA: Diagnosis not present

## 2021-10-19 DIAGNOSIS — Z992 Dependence on renal dialysis: Secondary | ICD-10-CM | POA: Diagnosis not present

## 2021-10-19 DIAGNOSIS — E1151 Type 2 diabetes mellitus with diabetic peripheral angiopathy without gangrene: Secondary | ICD-10-CM | POA: Insufficient documentation

## 2021-10-19 DIAGNOSIS — N186 End stage renal disease: Secondary | ICD-10-CM | POA: Insufficient documentation

## 2021-10-19 NOTE — Progress Notes (Signed)
Nancy [name], Nancy [name] ([id number]) Visit Report for 10/19/2021 Debridement Details Patient Name: Date of Service: Nancy Nancy [name] 10/19/2021 10:45 A M Medical Record Number: [id number] Patient Account Number: [id number] Date of Birth/Sex: Treating RN: 06/26/1961 (60 y.o. Nancy [name], Nancy [name] Primary Care Provider: Karle [name] Other Clinician: Referring Provider: Treating Provider/Extender: Nancy [name] in Treatment: 2 Debridement Performed for Assessment: Wound #2 Left Calcaneus Performed By: Physician Nancy Dillon., MD Debridement Type: Debridement Severity of Tissue Pre Debridement: Fat layer exposed Level of Consciousness (Pre-procedure): Awake and Alert Pre-procedure Verification/Time Out Yes - 11:50 Taken: Start Time: 11:51 Pain Control: Other : Benzocaine 20% T Area Debrided (L x W): otal 5 (cm) x 5.5 (cm) = 27.5 (cm) Tissue and other material debrided: Viable, Non-Viable, Eschar, Fat, Slough, Subcutaneous, Fibrin/Exudate, Slough Level: Skin/Subcutaneous Tissue Debridement Description: Excisional Instrument: Blade, Scissors Bleeding: Moderate Hemostasis Achieved: Pressure End Time: 12:00 Procedural Pain: 0 Post Procedural Pain: 0 Response to Treatment: Procedure was tolerated well Level of Consciousness (Post- Awake and Alert procedure): Post Debridement Measurements of Total Wound Length: (cm) 5 Width: (cm) 5.5 Depth: (cm) 1.4 Volume: (cm) 30.238 Character of Wound/Ulcer Post Debridement: Requires Further Debridement Severity of Tissue Post Debridement: Fat layer exposed Post Procedure Diagnosis Same as Pre-procedure Electronic Signature(s) Signed: 10/19/2021 5:02:06 PM By: Nancy Ham MD Signed: 10/19/2021 6:11:26 PM By: Nancy Pilling RN, BSN Entered By: Nancy [name] on 10/19/2021 12:22:56 -------------------------------------------------------------------------------- Debridement Details Patient Name: Date of  Service: Nancy [name] 10/19/2021 10:45 A M Medical Record Number: [id number] Patient Account Number: [id number] Date of Birth/Sex: Treating RN: 01/29/1961 (60 y.o. Nancy [name], Nancy [name] Primary Care Provider: Karle [name] Other Clinician: Referring Provider: Treating Provider/Extender: Nancy [name] in Treatment: 2 Debridement Performed for Assessment: Wound #3 Left,Lateral Foot Performed By: Physician Nancy Dillon., MD Debridement Type: Debridement Severity of Tissue Pre Debridement: Fat layer exposed Level of Consciousness (Pre-procedure): Awake and Alert Pre-procedure Verification/Time Out Yes - 11:50 Taken: Start Time: 11:51 Pain Control: Other : Benzocaine 20% T Area Debrided (L x W): otal 2 (cm) x 3.2 (cm) = 6.4 (cm) Tissue and other material debrided: Viable, Non-Viable, Eschar, Fat, Slough, Subcutaneous, Fibrin/Exudate, Slough Level: Skin/Subcutaneous Tissue Debridement Description: Excisional Instrument: Blade, Scissors Bleeding: Moderate Hemostasis Achieved: Pressure End Time: 12:00 Procedural Pain: 0 Post Procedural Pain: 0 Response to Treatment: Procedure was tolerated well Level of Consciousness (Post- Awake and Alert procedure): Post Debridement Measurements of Total Wound Length: (cm) 2 Width: (cm) 3.2 Depth: (cm) 0.4 Volume: (cm) 2.011 Character of Wound/Ulcer Post Debridement: Requires Further Debridement Severity of Tissue Post Debridement: Fat layer exposed Post Procedure Diagnosis Same as Pre-procedure Electronic Signature(s) Signed: 10/19/2021 5:02:06 PM By: Nancy Ham MD Signed: 10/19/2021 6:11:26 PM By: Nancy Pilling RN, BSN Entered By: Nancy [name] on 10/19/2021 12:23:06 -------------------------------------------------------------------------------- HPI Details Patient Name: Date of Service: Nancy [name] 10/19/2021 10:45 A M Medical Record Number: [id number] Patient Account Number:  [id number] Date of Birth/Sex: Treating RN: 15-Aug-1961 (60 y.o. Nancy [name] Primary Care Provider: Karle [name] Other Clinician: Referring Provider: Treating Provider/Extender: Nancy [name] in Treatment: 2 History of Present Illness HPI Description: ADMISSION 10/04/2021 This is a 60 year old woman with multiple severe medical issues who arrives for review of wounds on her left foot. Recent medical history in epic shows a cellulitis of these left foot with gangrenous change and admission to hospital from 9/19 through 10/6. An MRI of the foot  surprisingly did not show osteomyelitis. In the hospital she was treated with IV antibiotics and then discharged on Augmentin and doxycycline she has completed this. She also had noninvasive arterial studies that showed an ABI in the right of 0.45 on the left 0.46 monophasic waveforms bilaterally. They did not do TBI's. The patient was seen by Dr. Trula [name] in consult in the hospital. He felt she had evidence of arterial occlusive disease with an ABI on the left of 0.5 with monophasic waveforms at the ankle. Noted that any attempt at using dye in this patient would potentially push her into acute renal failure and with the patient who has been refusing dialysis this would be a fatal event. The patient saw palliative care but she was not interested in pursuing pure palliative care or SNF placement she has Medicaid pending but she does have home health The patient also was seen by nephrology in the hospital. She told them she did not want dialysis I think because her mother was on dialysis at 1 point. They did recommend a AV fistula. Again the patient refused. They noted deterioration from stage IV to stage V chronic renal failure but they did not feel there was an urgent need for dialysis at that moment. The patient has a large boggy ischemic wound on most of her left heel. On the left lateral foot there is an open area with  covering dry gangrene. On the dorsal foot just distal to the ankle there is a better looking wound surface that may have been a wrap injury at 1 point. The patient says that her heel and lateral foot have been there for about 2 months and the dorsal foot is uncertain. She has minimal tolerance with walking because of pain in her legs likely claudication. 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 Her discharge creatinine in the hospital was 2.75 with an estimated GFR of 19. It was much higher than this on presentation. Both vascular and Ortho recommended an amputation which the patient refused 11/3; since the patient was last here she was hospitalized for 5 days from 10/05/2021 through 10/10/2021 this was because of acute on chronic congestive heart failure. Post diuresis she was felt to have chronic kidney disease stage IV. Nephrology was consulted during the original admission and felt there was no indication for starting dialysis while an inpatient. Apparently has a follow-up with nephrologist Dr. Hollie Salk in 2 or 3 months. She did not see a vascular during this hospitalization although she previously saw Dr. Trula [name] at a point in time where she was apparently refusing dialysis although I have said previously I do not think she really understood what she was being told We have been using Betadine on the necrotic wounds on the heel and the lateral foot and silver collagen on the dorsal foot wound on the left she does not have any wounds on the right. She comes in today with a necrotic eschar on the heel and the lateral foot separating visibly. Electronic Signature(s) Signed: 10/19/2021 5:27:39 PM By: Nancy Ham MD Previous Signature: 10/19/2021 5:02:06 PM Version By: Nancy Ham MD Entered By: Nancy [name] on 10/19/2021  17:03:57 -------------------------------------------------------------------------------- Physical Exam Details Patient Name: Date of Service: Nancy [name] 10/19/2021 10:45 A M Medical Record Number: [id number] Patient Account Number: [id number] Date of Birth/Sex: Treating RN: 12/08/1961 (60 y.o. Nancy [name] Primary Care Provider: Karle [name] Other Clinician: Referring Provider:  Treating Provider/Extender: Nancy [name] in Treatment: 2 Constitutional Sitting or standing Blood Pressure is within target range for patient.. Pulse regular and within target range for patient.Marland Kitchen Respirations regular, non-labored and within target range.. Temperature is normal and within the target range for the patient.Marland Kitchen Appears in no distress. Cardiovascular Absent in the left foot. Notes Wound exam; the patient has a large boggy separating eschar over her left heel and also on the lateral foot. I used pickups and a #10 scalpel to remove this and as much of the underlying necrosis as I could do. There was bleeding from the heel not from the lateral foot. Paradoxically the area on the dorsal foot looks somewhat better. We use silver collagen here. There is no surrounding infection on any of the wounds. I am not sure how deep the necrotic wounds are but it cannot be far off the surface of bone Electronic Signature(s) Signed: 10/19/2021 5:02:06 PM By: Nancy Ham MD Entered By: Nancy [name] on 10/19/2021 12:27:58 -------------------------------------------------------------------------------- Physician Orders Details Patient Name: Date of Service: Nancy [name] 10/19/2021 10:45 A M Medical Record Number: [id number] Patient Account Number: [id number] Date of Birth/Sex: Treating RN: 01/18/1961 (60 y.o. 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 - Dr. Dellia Nims Return A Go pick up materials to make the 1/4 strength Dakin's. Bathing/ Shower/ Hygiene May shower with protection but do not get wound dressing(s) wet. Edema Control - Lymphedema / SCD / Other Elevate legs to the level of the heart or above for 30 minutes daily and/or when sitting, a frequency of: Avoid standing for long periods of time. Off-Loading Other: - Offloading boot to left foot; float heel/foot when sitting/laying down. Use pillow and place under your leg so your heel isnt laying against bed!!!! [place] wound care orders this week; continue Home Health for wound care. May utilize formulary equivalent dressing for wound treatment orders unless otherwise specified. - [place] to change 2-3 x a wk Wound Treatment Wound #1 - Foot Wound Laterality: Dorsal, Left Cleanser: Normal Saline (Home Health) 1 x Per Day/30 Days Discharge Instructions: Cleanse the wound with Normal Saline prior to applying a clean dressing using gauze sponges, not tissue or cotton balls. Cleanser: Soap and Water [facility]) 1 x Per Day/30 Days Discharge Instructions: May shower and wash wound with dial antibacterial soap and water prior to dressing change. Cleanser: Wound Cleanser (Home Health) 1 x Per Day/30 Days Discharge Instructions: Cleanse the wound with wound cleanser prior to applying a clean dressing  using gauze sponges, not tissue or cotton balls. Prim Dressing: Promogran Prisma Matrix, 4.34 (sq in) (silver collagen) (Home Health) 1 x Per Day/30 Days ary Discharge Instructions: Moisten collagen with saline or hydrogel Secondary Dressing: Woven Gauze Sponge, Non-Sterile 4x4 in (Home Health) 1 x Per Day/30 Days Discharge Instructions: Apply over primary dressing as directed. Secondary Dressing: ABD Pad, 5x9 (Home Health) 1 x Per  Day/30 Days Discharge Instructions: Apply over primary dressing as directed. Secured With: [employer], 4.5x3.1 (in/yd) (Home Health) 1 x Per Day/30 Days Discharge Instructions: Secure with Kerlix as directed. Secured With: 31M Medipore H Soft Cloth Surgical T ape, 4 x 10 (in/yd) (Home Health) 1 x Per Day/30 Days Discharge Instructions: Secure with tape as directed. Wound #2 - Calcaneus Wound Laterality: Left Cleanser: Normal Saline (Home Health) 1 x Per Day/30 Days Discharge Instructions: Cleanse the wound with Normal Saline prior to applying a clean dressing using gauze sponges, not tissue or cotton balls. Cleanser: Soap and Water [facility]) 1 x Per Day/30 Days Discharge Instructions: May shower and wash wound with dial antibacterial soap and water prior to dressing change. Cleanser: Wound Cleanser (Home Health) 1 x Per Day/30 Days Discharge Instructions: Cleanse the wound with wound cleanser prior to applying a clean dressing using gauze sponges, not tissue or cotton balls. Prim Dressing: 1/4 strength Dakin's wet to dry (Home Health) 1 x Per Day/30 Days ary Discharge Instructions: apply Dakin's wet to dry apply directly to wound bed. Secondary Dressing: Woven Gauze Sponge, Non-Sterile 4x4 in (Home Health) 1 x Per Day/30 Days Discharge Instructions: Apply over primary dressing as directed. Secondary Dressing: ABD Pad, 5x9 (Home Health) 1 x Per Day/30 Days Discharge Instructions: Apply over primary dressing as directed. Secured With: [employer], 4.5x3.1 (in/yd) (Home Health) 1 x Per Day/30 Days Discharge Instructions: Secure with Kerlix as directed. Secured With: 31M Medipore H Soft Cloth Surgical T ape, 4 x 10 (in/yd) (Home Health) 1 x Per Day/30 Days Discharge Instructions: Secure with tape as directed. Wound #3 - Foot Wound Laterality: Left, Lateral Cleanser: Normal Saline (Home Health) 1 x Per Day/30 Days Discharge Instructions: Cleanse the wound with Normal Saline prior to applying a clean dressing using gauze sponges, not tissue or cotton balls. Cleanser: Soap and Water [facility]) 1 x Per Day/30 Days Discharge Instructions: May shower and wash wound with dial antibacterial soap and water prior to dressing change. Cleanser: Wound Cleanser (Home Health) 1 x Per Day/30 Days Discharge Instructions: Cleanse the wound with wound cleanser prior to applying a clean dressing using gauze sponges, not tissue or cotton balls. Prim Dressing: 1/4 strength Dakin's wet to dry (Home Health) 1 x Per Day/30 Days ary Discharge Instructions: apply Dakin's wet to dry apply directly to wound bed. Secondary Dressing: Woven Gauze Sponge, Non-Sterile 4x4 in (Home Health) 1 x Per Day/30 Days Discharge Instructions: Apply over primary dressing as directed. Secondary Dressing: ABD Pad, 5x9 (Home Health) 1 x Per Day/30 Days Discharge Instructions: Apply over primary dressing as directed. Secured With: [employer], 4.5x3.1 (in/yd) (Home Health) 1 x Per Day/30 Days Discharge Instructions: Secure with Kerlix as directed. Secured With: 31M Medipore H Soft Cloth Surgical T ape, 4 x 10 (in/yd) (Home Health) 1 x Per Day/30 Days Discharge Instructions: Secure with tape as directed. Electronic Signature(s) Signed: 10/19/2021 5:02:06 PM By: Nancy Ham MD Signed: 10/19/2021 6:11:26 PM By: Nancy Pilling RN, BSN Entered By: Nancy [name] on 10/19/2021  12:08:51 -------------------------------------------------------------------------------- Problem List Details Patient Name: Date of Service: Nancy [name] 10/19/2021 10:45 A M Medical Record Number: [id number] Patient Account Number: [id number] Date of Birth/Sex: Treating RN: [birth year] (60 y.o. Nancy [name] Primary Care Provider: Karle [name] Other Clinician: Referring Provider: Treating Provider/Extender: Nancy [name] in Treatment: 2 Active Problems ICD-10 Encounter Code Description Active Date MDM Diagnosis E11.621 Type 2 diabetes mellitus with  foot ulcer 10/04/2021 No Yes E11.52 Type 2 diabetes mellitus with diabetic peripheral angiopathy with gangrene 10/04/2021 No Yes L97.428 Non-pressure chronic ulcer of left heel and midfoot with other specified 10/04/2021 No Yes severity L97.528 Non-pressure chronic ulcer of other part of left foot with other specified 10/04/2021 No Yes severity I12.0 Hypertensive chronic kidney disease with stage 5 chronic kidney disease or 10/04/2021 No Yes end stage renal disease Inactive Problems Resolved Problems Electronic Signature(s) Signed: 10/19/2021 5:02:06 PM By: Nancy Ham MD Entered By: Nancy [name] on 10/19/2021 12:13:42 -------------------------------------------------------------------------------- Progress Note Details Patient Name: Date of Service: Nancy [name] 10/19/2021 10:45 A M Medical Record Number: [id number] Patient Account Number: [id number] Date of Birth/Sex: Treating RN: 09-Dec-1961 (60 y.o. Nancy [name] Primary Care Provider: Karle [name] Other Clinician: Referring Provider: Treating Provider/Extender: Nancy [name] in Treatment: 2 Subjective History of Present Illness (HPI) ADMISSION 10/04/2021 This is a 60 year old woman with multiple severe medical issues who arrives for review of wounds on her left foot. Recent  medical history in epic shows a cellulitis of these left foot with gangrenous change and admission to hospital from 9/19 through 10/6. An MRI of the foot surprisingly did not show osteomyelitis. In the hospital she was treated with IV antibiotics and then discharged on Augmentin and doxycycline she has completed this. She also had noninvasive arterial studies that showed an ABI in the right of 0.45 on the left 0.46 monophasic waveforms bilaterally. They did not do TBI's. The patient was seen by Dr. Trula [name] in consult in the hospital. He felt she had evidence of arterial occlusive disease with an ABI on the left of 0.5 with monophasic waveforms at the ankle. Noted that any attempt at using diet in this patient would potentially push her into acute renal failure and with the patient who has been refusing dialysis this would be a fatal event. The patient saw palliative care but she was not interested in pursuing pure palliative care or SNF placement she has Medicaid pending but she does have home health The patient also was seen by nephrology in the hospital. She told them she did not want dialysis I think because her mother was on dialysis at 1 point. They did recommend a AV fistula. Again the patient refused. They noted deterioration from stage IV to stage V chronic renal failure but they did not feel there was an urgent need for dialysis at that moment. The patient has a large boggy ischemic wound on most of her left heel. On the left lateral foot there is an open area with covering dry gangrene. On the dorsal foot just distal to the ankle there is a better looking wound surface that may have been a wrap injury at 1 point. The patient says that her heel and lateral foot have been there for about 2 months and the dorsal foot is uncertain. She has minimal tolerance with walking because of pain in her legs likely claudication. Past medical history includes type 2 diabetes with peripheral neuropathy on  insulin, diastolically mediated congestive heart failure, hyperlipidemia, hypertension. Her discharge creatinine in the hospital was 2.75 with an estimated GFR of 19. It was much higher than this on presentation. Both vascular and Ortho recommended an amputation which the patient refused 11/3; since the patient was last here she was hospitalized for 5 days from 10/05/2021 through 10/10/2021 this was because of acute on chronic congestive heart failure. Post diuresis she was felt to have chronic kidney  disease stage IV. Nephrology was consulted during the original admission and felt there was no indication for starting dialysis while an inpatient. Apparently has a follow-up with nephrologist Dr. Hollie Salk in 2 or 3 months. She did not see a vascular during this hospitalization although she previously saw Dr. Trula [name] at a point in time where she was apparently refusing dialysis although I have said previously I do not think she really understood what she was being told We have been using Betadine on the necrotic wounds on the heel and the lateral foot and silver collagen on the dorsal foot wound on the left she does not have any wounds on the right. She comes in today with a necrotic eschar on the heel and the lateral foot separating visibly. Patient History Unable to Obtain Patient History due to Altered Mental Status. Information obtained from Patient, Caregiver, Chart. Family History Heart Disease - Mother,Father, No family history of Cancer, Diabetes, Hereditary Spherocytosis, Kidney Disease, Lung Disease. Social History Never smoker, Marital Status - Married, Alcohol Use - Never, Drug Use - No History, Caffeine Use - Moderate. Medical History Eyes Denies history of Cataracts, Glaucoma, Optic Neuritis Cardiovascular Patient has history of Congestive Heart Failure, Hypertension, Peripheral Arterial Disease Denies history of Angina, Arrhythmia, Coronary Artery Disease, Deep Vein Thrombosis,  Hypotension, Myocardial Infarction, Peripheral Venous Disease, Phlebitis, Vasculitis Endocrine Patient has history of Type II Diabetes Denies history of Type I Diabetes Genitourinary Denies history of End Stage Renal Disease Integumentary (Skin) Denies history of History of Burn Musculoskeletal Denies history of Gout, Rheumatoid Arthritis, Osteoarthritis, Osteomyelitis Neurologic Patient has history of Dementia, Neuropathy Denies history of Quadriplegia, Paraplegia, Seizure Disorder Psychiatric Denies history of Anorexia/bulimia, Confinement Anxiety Hospitalization/Surgery History - CHF fluid overload 10/20-10/25-2022 [place]. Medical A Surgical History Notes nd Genitourinary [facility] Psychiatric hallucinations with risperidone Objective Constitutional Sitting or standing Blood Pressure is within target range for patient.. Pulse regular and within target range for patient.Marland Kitchen Respirations regular, non-labored and within target range.. Temperature is normal and within the target range for the patient.Marland Kitchen Appears in no distress. Vitals Time Taken: 11:09 AM, Height: 63 in, Weight: 232 lbs, BMI: 41.1, Temperature: 99.0 F, Pulse: 82 bpm, Respiratory Rate: 17 breaths/min, Blood Pressure: 142/64 mmHg, Capillary Blood Glucose: 178 mg/dl. Cardiovascular Absent in the left foot. General Notes: Wound exam; the patient has a large boggy separating eschar over her left heel and also on the lateral foot. I used pickups and a #10 scalpel to remove this and as much of the underlying necrosis as I could do. There was bleeding from the heel not from the lateral foot. Paradoxically the area on the dorsal foot looks somewhat better. We use silver collagen here. There is no surrounding infection on any of the wounds. I am not sure how deep the necrotic wounds are but it cannot be far off the surface of bone Integumentary (Hair, Skin) Wound #1 status is Open. Original cause of wound was Gradually  Appeared. The date acquired was: 09/04/2021. The wound has been in treatment 2 weeks. The wound is located on the Left,Dorsal Foot. The wound measures 0.4cm length x 1.9cm width x 0.1cm depth; 0.597cm^2 area and 0.06cm^3 volume. There is Fat Layer (Subcutaneous Tissue) exposed. There is no tunneling or undermining noted. There is a medium amount of serosanguineous drainage noted. The wound margin is distinct with the outline attached to the wound base. There is medium (34-66%) granulation within the wound bed. There is a medium (34-66%) amount of necrotic tissue within the  wound bed including Adherent Slough. Wound #2 status is Open. Original cause of wound was Gradually Appeared. The date acquired was: 08/04/2021. The wound has been in treatment 2 weeks. The wound is located on the Left Calcaneus. The wound measures 5cm length x 5.5cm width x 1.4cm depth; 21.598cm^2 area and 30.238cm^3 volume. There is no tunneling or undermining noted. There is a large amount of serosanguineous drainage noted. The wound margin is distinct with the outline attached to the wound base. There is no granulation within the wound bed. There is a large (67-100%) amount of necrotic tissue within the wound bed including Eschar and Adherent Slough. Wound #3 status is Open. Original cause of wound was Gradually Appeared. The date acquired was: 08/04/2021. The wound has been in treatment 2 weeks. The wound is located on the Left,Lateral Foot. The wound measures 2cm length x 3.2cm width x 0.3cm depth; 5.027cm^2 area and 1.508cm^3 volume. There is no tunneling or undermining noted. There is a medium amount of serosanguineous drainage noted. The wound margin is distinct with the outline attached to the wound base. There is no granulation within the wound bed. There is a large (67-100%) amount of necrotic tissue within the wound bed including Eschar and Adherent Slough. 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 There was a Excisional Skin/Subcutaneous Tissue Debridement with a total area of 27.5 sq cm performed by Nancy Dillon., MD. With the following instrument(s): Blade, and Scissors to remove Viable and Non-Viable tissue/material. Material removed includes Fat, Eschar, Subcutaneous Tissue, Slough, and Fibrin/Exudate after achieving pain control using Other (Benzocaine 20%). A time out was conducted at 11:50, prior to the start of the procedure. A Moderate amount of bleeding was controlled with Pressure. The procedure was tolerated well with a pain level of 0 throughout and a pain level of 0 following the procedure. Post Debridement Measurements: 5cm length x 5.5cm width x 1.4cm depth; 30.238cm^3 volume. Character of Wound/Ulcer Post Debridement requires further debridement. Severity of Tissue Post Debridement is: Fat layer exposed. 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 There was a Excisional Skin/Subcutaneous Tissue Debridement with a total area of 6.4 sq cm performed by Nancy Dillon., MD. With the following instrument(s): Blade, and Scissors to remove Viable and Non-Viable tissue/material. Material removed includes Fat, Eschar, Subcutaneous Tissue,  Slough, and Fibrin/Exudate after achieving pain control using Other (Benzocaine 20%). A time out was conducted at 11:50, prior to the start of the procedure. A Moderate amount of bleeding was controlled with Pressure. The procedure was tolerated well with a pain level of 0 throughout and a pain level of 0 following the procedure. Post Debridement Measurements: 2cm length x 3.2cm width x 0.4cm depth; 2.011cm^3 volume. Character of Wound/Ulcer Post Debridement requires further debridement. Severity of Tissue Post Debridement is: Fat layer exposed. Post procedure Diagnosis Wound #3:  Same as Pre-Procedure Plan Follow-up Appointments: Return Appointment in 2 weeks. - Dr. Dellia Nims Go pick up materials to make the 1/4 strength Dakin's. Bathing/ Shower/ Hygiene: May shower with protection but do not get wound dressing(s) wet. Edema Control - Lymphedema / SCD / Other: Elevate legs to the level of the heart or above for 30 minutes daily and/or when sitting, a frequency of: Avoid standing for long periods of time. Off-Loading: Other: - Offloading boot to left foot; float heel/foot when sitting/laying down. Use pillow and place under your leg so your heel isnt laying against bed!!!! Home Health: New wound care orders this week; continue Home Health for wound care. May utilize formulary equivalent dressing for wound treatment orders unless otherwise specified. - [place] to change 2-3 x a wk WOUND #1: - Foot Wound Laterality: Dorsal, Left Cleanser: Normal Saline (Home Health) 1 x Per Day/30 Days Discharge Instructions: Cleanse the wound with Normal Saline prior to applying a clean dressing using gauze sponges, not tissue or cotton balls. Cleanser: Soap and Water [facility]) 1 x Per Day/30 Days Discharge Instructions: May shower and wash wound with dial antibacterial soap and water prior to dressing change. Cleanser: Wound Cleanser (Home Health) 1 x Per Day/30 Days Discharge Instructions:  Cleanse the wound with wound cleanser prior to applying a clean dressing using gauze sponges, not tissue or cotton balls. Prim Dressing: Promogran Prisma Matrix, 4.34 (sq in) (silver collagen) (Home Health) 1 x Per Day/30 Days ary Discharge Instructions: Moisten collagen with saline or hydrogel Secondary Dressing: Woven Gauze Sponge, Non-Sterile 4x4 in (Home Health) 1 x Per Day/30 Days Discharge Instructions: Apply over primary dressing as directed. Secondary Dressing: ABD Pad, 5x9 (Home Health) 1 x Per Day/30 Days Discharge Instructions: Apply over primary dressing as directed. Secured With: [employer], 4.5x3.1 (in/yd) (Home Health) 1 x Per Day/30 Days Discharge Instructions: Secure with Kerlix as directed. Secured With: 87M Medipore H Soft Cloth Surgical T ape, 4 x 10 (in/yd) (Home Health) 1 x Per Day/30 Days Discharge Instructions: Secure with tape as directed. WOUND #2: - Calcaneus Wound Laterality: Left Cleanser: Normal Saline (Home Health) 1 x Per Day/30 Days Discharge Instructions: Cleanse the wound with Normal Saline prior to applying a clean dressing using gauze sponges, not tissue or cotton balls. Cleanser: Soap and Water [facility]) 1 x Per Day/30 Days Discharge Instructions: May shower and wash wound with dial antibacterial soap and water prior to dressing change. Cleanser: Wound Cleanser (Home Health) 1 x Per Day/30 Days Discharge Instructions: Cleanse the wound with wound cleanser prior to applying a clean dressing using gauze sponges, not tissue or cotton balls. Prim Dressing: 1/4 strength Dakin's wet to dry (Home Health) 1 x Per Day/30 Days ary Discharge Instructions: apply Dakin's wet to dry apply directly to wound bed. Secondary Dressing: Woven Gauze Sponge, Non-Sterile 4x4 in (Home Health) 1 x Per Day/30 Days Discharge Instructions: Apply over primary dressing as directed. Secondary Dressing: ABD Pad, 5x9 (Home Health) 1 x Per Day/30 Days Discharge Instructions:  Apply over primary dressing as directed. Secured With: [employer], 4.5x3.1 (in/yd) (Home Health) 1 x Per Day/30 Days Discharge Instructions: Secure with Kerlix as directed. Secured With: 87M Medipore H Soft Cloth Surgical T ape, 4 x 10 (in/yd) (Home Health) 1 x Per Day/30 Days Discharge Instructions: Secure with tape as directed. WOUND #3: - Foot Wound Laterality: Left, Lateral Cleanser: Normal Saline (Home Health) 1 x Per Day/30 Days Discharge Instructions: Cleanse the wound with Normal  Saline prior to applying a clean dressing using gauze sponges, not tissue or cotton balls. Cleanser: Soap and Water [facility]) 1 x Per Day/30 Days Discharge Instructions: May shower and wash wound with dial antibacterial soap and water prior to dressing change. Cleanser: Wound Cleanser (Home Health) 1 x Per Day/30 Days Discharge Instructions: Cleanse the wound with wound cleanser prior to applying a clean dressing using gauze sponges, not tissue or cotton balls. Prim Dressing: 1/4 strength Dakin's wet to dry (Home Health) 1 x Per Day/30 Days ary Discharge Instructions: apply Dakin's wet to dry apply directly to wound bed. Secondary Dressing: Woven Gauze Sponge, Non-Sterile 4x4 in (Home Health) 1 x Per Day/30 Days Discharge Instructions: Apply over primary dressing as directed. Secondary Dressing: ABD Pad, 5x9 (Home Health) 1 x Per Day/30 Days Discharge Instructions: Apply over primary dressing as directed. Secured With: [employer], 4.5x3.1 (in/yd) (Home Health) 1 x Per Day/30 Days Discharge Instructions: Secure with Kerlix as directed. Secured With: 4M Medipore H Soft Cloth Surgical T ape, 4 x 10 (in/yd) (Home Health) 1 x Per Day/30 Days Discharge Instructions: Secure with tape as directed. #1 after careful consideration at the suggestion of our intake nurse we elected to use Dakin's wet-to-dry change daily to the 2 necrotic areas on the heel and the lateral foot. She has Medicaid this  would make her largely ineligible for Santyl and Medicaid does not cover many other wound care supplies. 2. According to the tone of the most recent admission last week she does not need urgent dialysis. 3. The patient is going to need angiography. Whether anybody would be prepared to use standard contrast or not I am uncertain. She did see vein and vascular in the hospital a month ago but at that time she was refusing dialysis and I certainly agree that that would make her ineligible for angiography. It is never been clear to me that the patient understood a word of what she was told during that admission and she is now saying she would take dialysis if this was her only option 4. I will contact Dr. Gwenlyn Found to see if he would be willing to attempt angiography on the left leg perhaps CO2 angiography question of 5. This continues to be a limb threatening situation. So far no undue pain which I think is probably neuropathic and no spreading infection Electronic Signature(s) Signed: 10/19/2021 5:02:06 PM By: Nancy Ham MD Entered By: Nancy [name] on 10/19/2021 12:30:50 -------------------------------------------------------------------------------- HxROS Details Patient Name: Date of Service: Nancy [name] 10/19/2021 10:45 A M Medical Record Number: [id number] Patient Account Number: [id number] Date of Birth/Sex: Treating RN: 1961/09/18 (60 y.o. Nancy [name] Primary Care Provider: Karle [name] Other Clinician: Referring Provider: Treating Provider/Extender: Nancy [name] in Treatment: 2 Unable to Obtain Patient History due to Altered Mental Status Information Obtained From Patient Caregiver Chart Eyes Medical History: Negative for: Cataracts; Glaucoma; Optic Neuritis Cardiovascular Medical History: Positive for: Congestive Heart Failure; Hypertension; Peripheral Arterial Disease Negative for: Angina; Arrhythmia; Coronary Artery Disease;  Deep Vein Thrombosis; Hypotension; Myocardial Infarction; Peripheral Venous Disease; Phlebitis; Vasculitis Endocrine Medical History: Positive for: Type II Diabetes Negative for: Type I Diabetes Genitourinary Medical History: Negative for: End Stage Renal Disease Past Medical History Notes: [employer] Integumentary (Skin) Medical History: Negative for: History of Burn Musculoskeletal Medical History: Negative for: Gout; Rheumatoid Arthritis; Osteoarthritis; Osteomyelitis Neurologic Medical History: Positive for: Dementia; Neuropathy Negative for: Quadriplegia; Paraplegia; Seizure Disorder Psychiatric Medical History: Negative for: Anorexia/bulimia;  Confinement Anxiety Past Medical History Notes: hallucinations with risperidone Immunizations Pneumococcal Vaccine: Received Pneumococcal Vaccination: Yes Received Pneumococcal Vaccination On or After 60th Birthday: Yes Implantable Devices None Hospitalization / Surgery History Type of Hospitalization/Surgery CHF fluid overload 10/20-10/25-2022 [place] Family and Social History Cancer: No; Diabetes: No; Heart Disease: Yes - Mother,Father; Hereditary Spherocytosis: No; Kidney Disease: No; Lung Disease: No; Never smoker; Marital Status - Married; Alcohol Use: Never; Drug Use: No History; Caffeine Use: Moderate; Financial Concerns: No; Food, Clothing or Shelter Needs: No; Support System Lacking: No; Transportation Concerns: No Engineer, maintenance) Signed: 10/19/2021 5:02:06 PM By: Nancy Ham MD Signed: 10/19/2021 6:11:26 PM By: Nancy Pilling RN, BSN Entered By: Nancy [name] on 10/19/2021 11:41:50 -------------------------------------------------------------------------------- SuperBill Details Patient Name: Date of Service: Nancy Gorman. 10/19/2021 Medical Record Number: [id number] Patient Account Number: [id number] Date of Birth/Sex: Treating RN: 18-Mar-1961 (60 y.o. 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 L97.528 Non-pressure chronic ulcer of other part of left foot with other specified severity I12.0 Hypertensive chronic kidney disease with stage 5 chronic kidney disease or end stage renal disease Facility Procedures CPT4 Code: [id number] Description: 54008 - DEB SUBQ TISSUE 20 SQ CM/< ICD-10 Diagnosis Description L97.428 Non-pressure chronic ulcer of left heel and midfoot with other specified severi L97.528 Non-pressure chronic ulcer of other part of left foot with other specified seve Modifier: ty rity Quantity: 1 CPT4 Code: [id number] Description: 11045 - DEB SUBQ TISS EA ADDL 20CM ICD-10 Diagnosis Description L97.528 Non-pressure chronic ulcer of other part of left foot with other specified severity L97.428 Non-pressure chronic ulcer of left heel and midfoot with other specified  severity Modifier: Quantity: 1 Physician Procedures : CPT4 Code Description Modifier [id number] 11042 - WC PHYS SUBQ TISS 20 SQ CM ICD-10 Diagnosis Description L97.428 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

## 2021-10-19 NOTE — Telephone Encounter (Signed)
Spoke to Nancy [name] to remind her of her upcoming appointments today and tomorrow. She does have transportation set up for both. We decided to reschedule paramedicine visit for Monday. I will call and set up time Monday morning with her on 11/7. Call complete.

## 2021-10-20 ENCOUNTER — Encounter: Payer: Self-pay | Admitting: Internal Medicine

## 2021-10-20 ENCOUNTER — Other Ambulatory Visit: Payer: Self-pay

## 2021-10-20 ENCOUNTER — Telehealth: Payer: Self-pay | Admitting: Internal Medicine

## 2021-10-20 ENCOUNTER — Ambulatory Visit: Payer: Medicaid Other | Attending: Internal Medicine | Admitting: Internal Medicine

## 2021-10-20 VITALS — BP 172/93 | HR 79 | Resp 16

## 2021-10-20 DIAGNOSIS — I130 Hypertensive heart and chronic kidney disease with heart failure and stage 1 through stage 4 chronic kidney disease, or unspecified chronic kidney disease: Secondary | ICD-10-CM | POA: Diagnosis not present

## 2021-10-20 DIAGNOSIS — Z09 Encounter for follow-up examination after completed treatment for conditions other than malignant neoplasm: Secondary | ICD-10-CM | POA: Diagnosis not present

## 2021-10-20 DIAGNOSIS — L97408 Non-pressure chronic ulcer of unspecified heel and midfoot with other specified severity: Secondary | ICD-10-CM

## 2021-10-20 DIAGNOSIS — E1169 Type 2 diabetes mellitus with other specified complication: Secondary | ICD-10-CM | POA: Diagnosis not present

## 2021-10-20 DIAGNOSIS — N184 Chronic kidney disease, stage 4 (severe): Secondary | ICD-10-CM

## 2021-10-20 DIAGNOSIS — Z1231 Encounter for screening mammogram for malignant neoplasm of breast: Secondary | ICD-10-CM

## 2021-10-20 DIAGNOSIS — E669 Obesity, unspecified: Secondary | ICD-10-CM

## 2021-10-20 DIAGNOSIS — I5030 Unspecified diastolic (congestive) heart failure: Secondary | ICD-10-CM

## 2021-10-20 DIAGNOSIS — Z2821 Immunization not carried out because of patient refusal: Secondary | ICD-10-CM

## 2021-10-20 DIAGNOSIS — E11621 Type 2 diabetes mellitus with foot ulcer: Secondary | ICD-10-CM

## 2021-10-20 MED ORDER — CARVEDILOL 12.5 MG PO TABS
18.7500 mg | ORAL_TABLET | Freq: Two times a day (BID) | ORAL | 5 refills | Status: DC
  Filled 2021-10-20: qty 90, 30d supply, fill #0

## 2021-10-20 NOTE — Telephone Encounter (Signed)
Copied from [place] [id number]. Topic: General - Other >> Oct 20, 2021  9:38 AM Leward Quan A wrote: Reason for CRM: Tillie Rung with [facility] called in to inform Dr Wynetta Emery that patient just got out the hospital and want to postpone her evaluation until next Tuesday 10/24/21. Any further questions please call Tillie Rung at Ph# [phone]

## 2021-10-20 NOTE — Progress Notes (Signed)
Patient ID: Nancy [name], female    DOB: 11-04-1961  MRN: [id number]  CC: Transition of care visit Patient hospitalized 10/20-25/2022 Phone call from case worker 10/11/2021  Subjective: Nancy [name] is a 60 y.o. female who presents for transition of care visit.  Her husband is with her. 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   Patient hospitalized with acute on chronic diastolic CHF and hypertensive urgency.  Etiology determined likely secondary to medication noncompliance and increased fluid intake.  She was diuresed appropriately with good results.  She was discharged home with home physical therapy and Occupational Therapy.  She was continued on carvedilol 12.5 mg twice a day, amlodipine 10 mg daily and torsemide changed to 60 mg daily.  Baseline creatinine usually 2-3.  It had bumped to 3.12 in the hospital at the time of discharge. In regards to her diabetes, her A1c was 6.8 in September.  Lantus insulin decreased from 55 units to 30 units daily to help prevent hypoglycemia. She has gangrenous pressure ulcers of bilateral heels.  Plan was for her to continue with wound care as an outpatient.  Today: CHF/HTN: Patient does not have medications with her.  However she reports compliance with torsemide 60 mg daily, amlodipine and carvedilol.  She tells me she has taken them all for today. -Endorses some increasing lower extremity edema. Denies any increased shortness of breath.  Sleeps in a lazy boy recliner.  She has been trying to restrict her fluids.  Checks blood pressure at home but does not recall her readings. -Next appointment with cardiologist is  in January. -She saw her kidney specialist Dr. Hollie [name] the end of August.  DM: She is taking NovoLog 4 units with meals and Lantus 30 units daily.  Lowest blood sugar reading has been 97.  She continues to go to wound care and is wearing a boot on the left lower leg from wound care.  She last saw them yesterday.  HM: She tells me that she had her flu shot recently but does not recall where.  She declines pneumonia vaccine, Tdap and shingles vaccine.  Overdue for diabetic eye exam and for mammogram. Patient Active Problem List   Diagnosis Date Noted   Acute cardiogenic pulmonary edema ([place]) 10/06/2021   Coronary artery disease involving native coronary artery of native heart without angina pectoris 10/06/2021   Goals of care, counseling/discussion    Gangrene of left foot ([place])    Cellulitis of left foot    Diabetic ulcer of left foot ([place]) 09/04/2021   CHF (congestive heart failure) ([place]) 07/07/2021   Normocytic anemia 06/30/2021   CKD (chronic kidney disease), stage IV ([place]) 06/11/2021   Acute on chronic diastolic CHF (congestive heart failure) ([place]) 06/10/2021   Abnormal nuclear stress test    Dyspnea on exertion 03/07/2021   Orthopnea 02/25/2021   History of cerebrovascular accident (CVA) with residual deficit 05/06/2020   Diabetic peripheral neuropathy ([place]) 04/26/2020   AKI (acute kidney injury) ([place]) 04/20/2020   Generalized weakness 04/20/2020   Dehydration 04/20/2020   Generalized abdominal pain    Pancreatitis, acute 02/29/2020   Cerebrovascular accident (CVA) ([place]) 11/08/2019   Stenosis of right carotid artery 11/08/2019   Mixed diabetic hyperlipidemia  associated with type 2 diabetes mellitus ([place]) 11/08/2019   Cortical age-related cataract of both eyes 08/30/2019   Gait abnormality 08/30/2019   Statin declined 08/30/2019   Gastroesophageal reflux disease without esophagitis 04/18/2018   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   Type 2 diabetes mellitus with stage 4 chronic kidney  disease, with long-term current use of insulin ([place]) 10/29/2017   History of macular degeneration 10/29/2017   Chronic cervical pain 10/29/2016   Myalgia 09/14/2016   Insomnia 09/14/2016   Tinea pedis of both feet 08/20/2016   Macular degeneration 08/17/2016   Low back pain 09/21/2014   Hypertensive urgency 09/21/2014   Diabetic gastroparesis associated with type 2 diabetes mellitus ([place]) 09/14/2014   Thyroid nodule 08/17/2014   Obesity (BMI 30-39.9) 08/17/2014   Nausea & vomiting 08/04/2014   Type II diabetes mellitus with neurological manifestations, uncontrolled 08/04/2014     Current Outpatient Medications on File Prior to Visit  Medication Sig Dispense Refill   acetaminophen (TYLENOL) 500 MG tablet Take 500 mg by mouth every 6 (six) hours as needed for moderate pain or headache.     amLODipine (NORVASC) 10 MG tablet Take 1 tablet (10 mg total) by mouth daily. 30 tablet 3   aspirin 81 MG EC tablet Take 1 tablet (81 mg total) by mouth daily. 60 tablet 1   atorvastatin (LIPITOR) 40 MG tablet Take 1 tablet (40 mg total) by mouth daily. 30 tablet 4   Blood Glucose Monitoring Suppl (ONETOUCH VERIO) w/Device KIT Check blood sugar three times daily. E11.40 1 kit 0   calcium acetate (PHOSLO) 667 MG capsule Take 1 capsule (667 mg total) by mouth 3 (three) times daily with meals. 90 capsule 0   carvedilol (COREG) 12.5 MG tablet Take 1 tablet (12.5 mg total) by mouth 2 (two) times daily with a meal. 60 tablet 2   ferrous sulfate 325 (65 FE) MG tablet Take 1 tablet (325 mg total) by mouth daily with breakfast. 100 tablet 1   glucose blood test strip Use as instructedCheck blood sugar three times daily. E11.40 100 each 12   insulin glargine (LANTUS) 100 UNIT/ML Solostar Pen Inject 30 Units into the skin at bedtime. 15 mL 3   insulin lispro (HUMALOG KWIKPEN) 100 UNIT/ML KwikPen Inject 4 units before breakfast and dinner. (Patient taking differently: Inject 4 Units into the skin every evening.) 15  mL 3   Insulin Pen Needle 32G X 4 MM MISC Use to inject insulin as directed. 100 each 0   risperiDONE (RISPERDAL) 3 MG tablet TAKE 1 TABLET (3 MG TOTAL) BY MOUTH AT BEDTIME. 30 tablet 2   torsemide (DEMADEX) 20 MG tablet Take 3 tablets (60 mg total) by mouth daily. 90 tablet 0   senna-docusate (SENOKOT-S) 8.6-50 MG tablet Take 2 tablets by mouth at bedtime. (Patient not taking: Reported on 10/20/2021) 60 tablet 0   [DISCONTINUED] Insulin NPH Isophane & Regular (RELION 70/30 [place]) Inject 35 Units into the skin 2 (two) times daily.     No current facility-administered medications on file prior to visit.    Allergies  Allergen Reactions   Elemental Sulfur Hives and Other (See Comments)    PATIENT STATED THIS, MORE THAN LIKELY, SHOULD HAVE BEEN LOGGED AS "SULFA"   Hydralazine Hcl Other (See Comments)    Hair loss   Hydrocodone Itching and Other (See Comments)    Upset stomach   Metformin And Related Nausea And Vomiting and Other (See Comments)    Stomach pains, also   Other Nausea  Only and Other (See Comments)    Lettuce- Does not digest this!!   Plaquenil [Hydroxychloroquine Sulfate] Hives   Shellfish-Derived Products Nausea Only and Other (See Comments)    Caused an upset stomach   Shrimp (Diagnostic) Nausea Only and Other (See Comments)    Upset stomach    Sulfa Antibiotics Hives    Social History   Socioeconomic History   Marital status: Legally Separated    Spouse name: [name]   Number of children: 1   Years of education: some colle   Highest education level: Not on file  Occupational History   Occupation: Employed FT as Landscape architect    Comment: [employer] , NA  Tobacco Use   Smoking status: Former    Packs/day: 0.25    Years: 0.50    Pack years: 0.13    Types: Cigarettes   Smokeless tobacco: Never   Tobacco comments:    quit yrs ago  Vaping Use   Vaping Use: Never used  Substance and Sexual Activity   Alcohol use: No   Drug use: No   Sexual activity: Yes    Birth  control/protection: None  Other Topics Concern   Not on file  Social History Narrative   02/18/20 lives alone     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    Social Determinants of Health   Financial Resource Strain: Low Risk    Difficulty of Paying Living Expenses: Not very hard  Food Insecurity: No Food Insecurity   Worried About Charity fundraiser in the Last Year: Never true   Ran Out of Food in the Last Year: Never true  Transportation Needs: No Transportation Needs   Lack of Transportation (Medical): No   Lack of Transportation (Non-Medical): No  Physical Activity: Not on file  Stress: Not on file  Social Connections: Not on file  Intimate Partner Violence: Not on file    Family History  Problem Relation Age of Onset   Hypertension Mother    Heart disease Mother    Diabetes Mother    Thyroid disease Mother    Congestive Heart Failure Mother    Breast cancer Maternal Grandmother    Colon cancer Maternal Grandfather    Heart attack Sister    Heart disease Brother    Hyperlipidemia Brother    Hypertension Brother    Diabetes Father    Breast cancer Maternal Aunt     Past Surgical History:  Procedure Laterality Date   ABDOMINAL HYSTERECTOMY  2005   CATARACT EXTRACTION Left 11/2019   CESAREAN SECTION  1983    CHOLECYSTECTOMY N/A 03/05/2020   Procedure: LAPAROSCOPIC CHOLECYSTECTOMY WITH INTRAOPERATIVE CHOLANGIOGRAM;  Surgeon: Donnie Mesa, MD;  Location: [facility];  Service: General;  Laterality: N/A;   ESOPHAGOGASTRODUODENOSCOPY (EGD) WITH PROPOFOL Left 08/26/2014   Procedure: ESOPHAGOGASTRODUODENOSCOPY (EGD) WITH PROPOFOL;  Surgeon: Arta Silence, MD;  Location: [facility];  Service: Endoscopy;  Laterality: Left;   ESOPHAGOGASTRODUODENOSCOPY (EGD) WITH PROPOFOL N/A 03/03/2020   Procedure: ESOPHAGOGASTRODUODENOSCOPY (EGD) WITH PROPOFOL;  Surgeon: Jerene Bears, MD;  Location: [facility];  Service: Gastroenterology;  Laterality: N/A;    RIGHT HEART CATH N/A 07/14/2021   Procedure: RIGHT HEART CATH;  Surgeon: Larey Dresser, MD;  Location: [facility];  Service: Cardiovascular;  Laterality: N/A;   RIGHT/LEFT HEART CATH AND CORONARY ANGIOGRAPHY N/A 04/27/2021   Procedure: RIGHT/LEFT HEART CATH AND CORONARY ANGIOGRAPHY;  Surgeon: [name], MD;  Location: [facility];  Service: Cardiovascular;  Laterality: N/A;    ROS: Review of Systems Negative except as stated above  PHYSICAL EXAM: BP (!) 172/93   Pulse 79   Resp 16   SpO2 96%   Physical Exam  General appearance - alert, well appearing, and in no distress.  Patient sitting in wheelchair. Mental status -patient is a bit forgetful.  Husband provides some of the history. Neck - supple, no significant adenopathy Chest - clear to auscultation, no wheezes, rales or rhonchi, symmetric air entry Heart - normal rate, regular rhythm, normal S1, S2, no murmurs, rubs, clicks or gallops Extremities -2+ edema in the lower extremities.  Boot not removed of left leg.   CMP Latest Ref Rng & Units 10/10/2021 10/09/2021 10/08/2021  Glucose 70 - 99 mg/dL 80 121(H) 136(H)  BUN 8 - 23 mg/dL 46(H) 40(H) 32(H)  Creatinine 0.44 - 1.00 mg/dL 3.12(H) 3.01(H) 2.82(H)  Sodium 135 - 145 mmol/L 131(L) 133(L) 131(L)  Potassium 3.5 - 5.1 mmol/L 5.0 5.2(H) 5.1  Chloride 98 - 111 mmol/L 97(L) 100 100  CO2 22 - 32 mmol/L _0 Calcium 8.9 - 10.3 mg/dL 8.9 8.7(L) 8.5(L)  Total Protein 6.5 - 8.1 g/dL - - -  Total Bilirubin 0.3 - 1.2 mg/dL - - -  Alkaline Phos 38 - 126 U/L - - -  AST 15 - 41 U/L - - -  ALT 0 - 44 U/L - - -   Lipid Panel     Component Value Date/Time   CHOL 169 07/11/2021 0054   CHOL 193 02/11/2020 1103   TRIG 88 07/11/2021 0054   HDL 52 07/11/2021 0054   HDL 40 02/11/2020 1103   CHOLHDL 3.3 07/11/2021 0054   VLDL 18 07/11/2021 0054   LDLCALC 99 07/11/2021 0054   LDLCALC 122 (H) 02/11/2020 1103    CBC    Component Value Date/Time   WBC  7.8 10/10/2021 0111   RBC 3.09 (L) 10/10/2021 0111   HGB 8.8 (L) 10/10/2021 0111   HGB 8.8 (L) 06/30/2021 1226   HGB 12.5 07/08/2008 1551   HCT 28.2 (L) 10/10/2021 0111   HCT 28.3 (L) 06/30/2021 1226   HCT 37.3 07/08/2008 1551   PLT 263 10/10/2021 0111   PLT 304 06/30/2021 1226   MCV 91.3 10/10/2021 0111   MCV 89 06/30/2021 1226   MCV 84.7 07/08/2008 1551   MCH 28.5 10/10/2021 0111   MCHC 31.2 10/10/2021 0111   RDW 14.7 10/10/2021 0111   RDW 13.6 06/30/2021 1226   RDW 13.0 07/08/2008 1551   LYMPHSABS 2.1 10/06/2021 0500   LYMPHSABS 2.5 02/23/2021 1523   LYMPHSABS 4.6 (H) 07/08/2008 1551   MONOABS 0.9 10/06/2021 0500   MONOABS 0.6 07/08/2008 1551   EOSABS 0.2 10/06/2021 0500   EOSABS 0.5 (H) 02/23/2021 1523   BASOSABS 0.1 10/06/2021 0500   BASOSABS 0.1 02/23/2021 1523   BASOSABS 0.1 07/08/2008 1551    ASSESSMENT AND PLAN: 1. Hospital discharge follow-up   2. 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  Currently on torsemide 60 mg daily.  I have told her to increase to 80 mg daily for 2 days then go back to 60 mg twice a day. -Not at goal.  Increase carvedilol to 12.5 mg 1-1/2 tablets twice a day. - carvedilol (COREG) 12.5 MG tablet; Take 1.5 tablets (18.75 mg total) by mouth 2 (two) times daily with a meal.  Dispense: 90 tablet; Refill: 5  3. CKD (chronic kidney disease) stage 4, GFR 15-29 ml/min (HCC) She has cardiorenal syndrome.  We will recheck BMP today. - Basic Metabolic Panel  4. Type 2 diabetes mellitus with obesity (HCC) Blood sugars are good.  She will continue current dose of Lantus and NovoLog. - Ambulatory referral to Ophthalmology  5. Diabetic ulcer of heel associated with type 2 diabetes mellitus, with other ulcer severity, unspecified laterality ([place]) Continue follow-up with wound care.  6. Pneumococcal vaccination declined  7. Encounter for  screening mammogram for malignant neoplasm of breast - MM Digital Screening; Future    Patient was given the opportunity to ask questions.  Patient verbalized understanding of the plan and was able to repeat key elements of the plan.   No orders of the defined types were placed in this encounter.    Requested Prescriptions    No prescriptions requested or ordered in this encounter    No follow-ups on file.  [name], MD, FACP

## 2021-10-20 NOTE — Patient Instructions (Signed)
Increase carvedilol to 12.5 mg 1-1/2 tablets twice a day. Take torsemide 20 mg 4 tablets today and tomorrow then go back to taking 3 tablets daily.

## 2021-10-21 LAB — BASIC METABOLIC PANEL
BUN/Creatinine Ratio: 12 (ref 12–28)
BUN: 36 mg/dL — ABNORMAL HIGH (ref 8–27)
CO2: 26 mmol/L (ref 20–29)
Calcium: 8.6 mg/dL — ABNORMAL LOW (ref 8.7–10.3)
Chloride: 95 mmol/L — ABNORMAL LOW (ref 96–106)
Creatinine, Ser: 3 mg/dL — ABNORMAL HIGH (ref 0.57–1.00)
Glucose: 192 mg/dL — ABNORMAL HIGH (ref 70–99)
Potassium: 4.5 mmol/L (ref 3.5–5.2)
Sodium: 136 mmol/L (ref 134–144)
eGFR: 17 mL/min/{1.73_m2} — ABNORMAL LOW (ref >59)

## 2021-10-23 ENCOUNTER — Other Ambulatory Visit (HOSPITAL_COMMUNITY): Payer: Self-pay

## 2021-10-23 NOTE — Progress Notes (Signed)
Nancy [name], Nancy [name] ([id number]) Visit Report for 10/19/2021 Arrival Information Details Patient Name: Date of Service: Nancy [name] 10/19/2021 10:45 A M Medical Record Number: [id number] Patient Account Number: [id number] Date of Birth/Sex: Treating RN: Sep 29, 1961 (60 y.o. [name], Meta.Reding Primary Care Berkeley Veldman: [name] Other Clinician: Referring Regla Fitzgibbon: Treating Tiwana Chavis/Extender: Nyra Market in Treatment: 2 Visit Information History Since Last Visit Added or deleted any medications: No Patient Arrived: Ambulatory Any new allergies or adverse reactions: No Arrival Time: 11:08 Had a fall or experienced change in No Accompanied By: husband activities of daily living that may affect Transfer Assistance: None risk of falls: Patient Identification Verified: Yes Signs or symptoms of abuse/neglect since last visito No Secondary Verification Process Completed: Yes Hospitalized since last visit: No Patient Requires Transmission-Based Precautions: No Implantable device outside of the clinic excluding No Patient Has Alerts: Yes cellular tissue based products placed in the center Patient Alerts: ABI's: 09/22 R:0.45 L0.5 since last visit: Has Dressing in Place as Prescribed: Yes Pain Present Now: No Electronic Signature(s) Signed: 10/23/2021 3:53:30 PM By: Sandre Kitty Entered By: Sandre Kitty on 10/19/2021 11:09:08 -------------------------------------------------------------------------------- Encounter Discharge Information Details Patient Name: Date of Service: Nancy [name] 10/19/2021 10:45 A M Medical Record Number: [id number] Patient Account Number: [id number] Date of Birth/Sex: Treating RN: January 18, 1961 (60 y.o. [name] Primary Care Story Vanvranken: [name] Other Clinician: Referring Kella Splinter: Treating Haizel Gatchell/Extender: Nyra Market in Treatment: 2 Encounter Discharge  Information Items Post Procedure Vitals Discharge Condition: Stable Temperature (F): 99 Ambulatory Status: Ambulatory Pulse (bpm): 82 Discharge Destination: Home Respiratory Rate (breaths/min): 17 Transportation: Private Auto Blood Pressure (mmHg): 142/64 Accompanied By: spouse Schedule Follow-up Appointment: Yes Clinical Summary of Care: Electronic Signature(s) Signed: 10/19/2021 6:11:26 PM By: Deon Pilling RN, BSN Entered By: Deon Pilling on 10/19/2021 12:21:01 -------------------------------------------------------------------------------- Lower Extremity Assessment Details Patient Name: Date of Service: Nancy [name] 10/19/2021 10:45 A M Medical Record Number: [id number] Patient Account Number: [id number] Date of Birth/Sex: Treating RN: September 06, 1961 (60 y.o. [name] Primary Care Margery Szostak: [name] Other Clinician: Referring Yehya Brendle: Treating Adeliz Tonkinson/Extender: Nyra Market in Treatment: 2 Edema Assessment Assessed: Shirlyn Goltz: Yes] Patrice Paradise: No] Edema: [Left: Ye] [Right: s] Calf Left: Right: Point of Measurement: 33 cm From Medial Instep 45 cm Ankle Left: Right: Point of Measurement: 10 cm From Medial Instep 30 cm Vascular Assessment Pulses: Dorsalis Pedis Palpable: [Left:Yes] Electronic Signature(s) Signed: 10/19/2021 6:11:26 PM By: Deon Pilling RN, BSN Entered By: Deon Pilling on 10/19/2021 11:30:32 -------------------------------------------------------------------------------- Multi Wound Chart Details Patient Name: Date of Service: Nancy [name] 10/19/2021 10:45 A M Medical Record Number: [id number] Patient Account Number: [id number] Date of Birth/Sex: Treating RN: 06-11-1961 (60 y.o. [name], Meta.Reding Primary Care Kynzie Polgar: [name] Other Clinician: Referring Makalyn Lennox: Treating Keyli Duross/Extender: Nyra Market in Treatment: 2 Vital Signs Height(in):  63 Capillary Blood Glucose(mg/dl): 178 Weight(lbs): 232 Pulse(bpm): 77 Body Mass Index(BMI): 64 Blood Pressure(mmHg): 142/64 Temperature(F): 99.0 Respiratory Rate(breaths/min): 17 Photos: [1:Left, Dorsal Foot] [2:Left Calcaneus] [3:Left, Lateral Foot] Wound Location: [1:Gradually Appeared] [2:Gradually Appeared] [3:Gradually Appeared] Wounding Event: [1:Diabetic Wound/Ulcer of the Lower] [2:Diabetic Wound/Ulcer of the Lower] [3:Diabetic Wound/Ulcer of the Lower] Primary Etiology: [1:Extremity Congestive Heart Failure,] [2:Extremity Congestive Heart Failure,] [3:Extremity Congestive Heart Failure,] Comorbid History: [1:Hypertension, Peripheral Arterial Disease, Type II Diabetes, Dementia, Disease, Type II Diabetes, Dementia, Neuropathy 09/04/2021] [2:Hypertension, Peripheral Arterial Neuropathy 08/04/2021] [3:Hypertension, Peripheral Arterial  Disease, Type II Diabetes, Dementia, Neuropathy 08/04/2021] Date Acquired: [1:2] [2:2] [3:2] Weeks of Treatment: [1:Open] [2:Open] [3:Open] Wound Status: [1:0.4x1.9x0.1] [2:5x5.5x1.4] [3:2x3.2x0.3] Measurements L x W x D (cm) [1:0.597] [2:21.598] [3:5.027] A (cm) : rea [1:0.06] [2:30.238] [3:1.508] Volume (cm) : [1:82.30%] [2:5.20%] [3:0.00%] % Reduction in A [1:rea: 82.20%] [2:-1227.40%] [3:-199.80%] % Reduction in Volume: [1:Grade 2] [2:Unable to visualize wound bed] [3:Unable to visualize wound bed] Classification: [1:Medium] [2:Large] [3:Medium] Exudate A mount: [1:Serosanguineous] [2:Serosanguineous] [3:Serosanguineous] Exudate Type: [1:red, brown] [2:red, brown] [3:red, brown] Exudate Color: [1:Distinct, outline attached] [2:Distinct, outline attached] [3:Distinct, outline attached] Wound Margin: [1:Medium (34-66%)] [2:None Present (0%)] [3:None Present (0%)] Granulation A mount: [1:Medium (34-66%)] [2:Large (67-100%)] [3:Large (67-100%)] Necrotic A mount: [1:Adherent Slough] [2:Eschar, Adherent Slough] [3:Eschar, Adherent Slough] Necrotic  Tissue: [1:Fat Layer (Subcutaneous Tissue): Yes Fascia: No] [3:Fascia: No] Exposed Structures: [1:Fascia: No Tendon: No Muscle: No Joint: No Bone: No None] [2:Fat Layer (Subcutaneous Tissue): No Tendon: No Muscle: No Joint: No Bone: No Small (1-33%)] [3:Fat Layer (Subcutaneous Tissue): No Tendon: No Muscle: No Joint: No Bone: No None] Epithelialization: [1:N/A] [2:Debridement - Excisional] [3:Debridement - Excisional] Debridement: Pre-procedure Verification/Time Out N/A [2:11:50] [3:11:50] Taken: [1:N/A] [2:Other] [3:Other] Pain Control: [1:N/A] [2:Necrotic/Eschar, Fat, Subcutaneous,] [3:Necrotic/Eschar, Fat, Subcutaneous,] Tissue Debrided: [1:N/A] [2:Slough Skin/Subcutaneous Tissue] [3:Slough Skin/Subcutaneous Tissue] Level: [1:N/A] [2:27.5] [3:6.4] Debridement A (sq cm): [1:rea N/A] [2:Blade, Scissors] [3:Blade, Scissors] Instrument: [1:N/A] [2:Moderate] [3:Moderate] Bleeding: [1:N/A] [2:Pressure] [3:Pressure] Hemostasis Achieved: [1:N/A] [2:0] [3:0] Procedural Pain: [1:N/A] [2:0] [3:0] Post Procedural Pain: [1:N/A] [2:Procedure was tolerated well] [3:Procedure was tolerated well] Debridement Treatment Response: [1:N/A] [2:5x5.5x1.4] [3:2x3.2x0.4] Post Debridement Measurements L x W x D (cm) [1:N/A] [2:30.238] [3:2.011] Post Debridement Volume: (cm) [1:N/A] [2:Debridement] [3:Debridement] Treatment Notes Wound #1 (Foot) Wound Laterality: Dorsal, Left Cleanser Normal Saline Discharge Instruction: Cleanse the wound with Normal Saline prior to applying a clean dressing using gauze sponges, not tissue or cotton balls. Soap and Water Discharge Instruction: May shower and wash wound with dial antibacterial soap and water prior to dressing change. Wound Cleanser Discharge Instruction: Cleanse the wound with wound cleanser prior to applying a clean dressing using gauze sponges, not tissue or cotton balls. Peri-Wound Care Topical Primary Dressing Promogran Prisma Matrix, 4.34 (sq in)  (silver collagen) Discharge Instruction: Moisten collagen with saline or hydrogel Secondary Dressing Woven Gauze Sponge, Non-Sterile 4x4 in Discharge Instruction: Apply over primary dressing as directed. ABD Pad, 5x9 Discharge Instruction: Apply over primary dressing as directed. Secured With [employer], 4.5x3.1 (in/yd) Discharge Instruction: Secure with Kerlix as directed. 326M Medipore H Soft Cloth Surgical T ape, 4 x 10 (in/yd) Discharge Instruction: Secure with tape as directed. Compression Wrap Compression Stockings Add-Ons Wound #2 (Calcaneus) Wound Laterality: Left Cleanser Normal Saline Discharge Instruction: Cleanse the wound with Normal Saline prior to applying a clean dressing using gauze sponges, not tissue or cotton balls. Soap and Water Discharge Instruction: May shower and wash wound with dial antibacterial soap and water prior to dressing change. Wound Cleanser Discharge Instruction: Cleanse the wound with wound cleanser prior to applying a clean dressing using gauze sponges, not tissue or cotton balls. Peri-Wound Care Topical Primary Dressing 1/4 strength Dakin's wet to dry Discharge Instruction: apply Dakin's wet to dry apply directly to wound bed. Secondary Dressing Woven Gauze Sponge, Non-Sterile 4x4 in Discharge Instruction: Apply over primary dressing as directed. ABD Pad, 5x9 Discharge Instruction: Apply over primary dressing as directed. Secured With [employer], 4.5x3.1 (in/yd) Discharge Instruction: Secure with Kerlix as directed. 326M Medipore H Soft Cloth Surgical T  ape, 4 x 10 (in/yd) Discharge Instruction: Secure with tape as directed. Compression Wrap Compression Stockings Add-Ons Wound #3 (Foot) Wound Laterality: Left, Lateral Cleanser Normal Saline Discharge Instruction: Cleanse the wound with Normal Saline prior to applying a clean dressing using gauze sponges, not tissue or cotton balls. Soap and Water Discharge  Instruction: May shower and wash wound with dial antibacterial soap and water prior to dressing change. Wound Cleanser Discharge Instruction: Cleanse the wound with wound cleanser prior to applying a clean dressing using gauze sponges, not tissue or cotton balls. Peri-Wound Care Topical Primary Dressing 1/4 strength Dakin's wet to dry Discharge Instruction: apply Dakin's wet to dry apply directly to wound bed. Secondary Dressing Woven Gauze Sponge, Non-Sterile 4x4 in Discharge Instruction: Apply over primary dressing as directed. ABD Pad, 5x9 Discharge Instruction: Apply over primary dressing as directed. Secured With [employer], 4.5x3.1 (in/yd) Discharge Instruction: Secure with Kerlix as directed. 65M Medipore H Soft Cloth Surgical Tape, 4 x 10 (in/yd) Discharge Instruction: Secure with tape as directed. Compression Wrap Compression Stockings Add-Ons Electronic Signature(s) Signed: 10/19/2021 5:02:06 PM By: Linton Ham MD Signed: 10/19/2021 6:11:26 PM By: Deon Pilling RN, BSN Entered By: Linton Ham on 10/19/2021 12:22:40 -------------------------------------------------------------------------------- Multi-Disciplinary Care Plan Details Patient Name: Date of Service: [address] NICA H. 10/19/2021 10:45 A M Medical Record Number: [id number] Patient Account Number: [id number] Date of Birth/Sex: Treating RN: [birth year] (60 y.o. [name], [name] Primary Care Nickcole Bralley: [name] Other Clinician: Referring Tifanny Dollens: Treating Lamica Mccart/Extender: Nyra Market in Treatment: 2 Active Inactive Wound/Skin Impairment Nursing Diagnoses: Impaired tissue integrity Knowledge deficit related to ulceration/compromised skin integrity Goals: Patient will have a decrease in wound volume by X% from date: (specify in notes) Date Initiated: 10/04/2021 Target Resolution Date: 12/15/2021 Goal Status: Active Patient/caregiver will verbalize  understanding of skin care regimen Date Initiated: 10/04/2021 Target Resolution Date: 12/08/2021 Goal Status: Active Ulcer/skin breakdown will have a volume reduction of 30% by week 4 Date Initiated: 10/04/2021 Target Resolution Date: 12/14/2021 Goal Status: Active Interventions: Assess patient/caregiver ability to obtain necessary supplies Assess patient/caregiver ability to perform ulcer/skin care regimen upon admission and as needed Assess ulceration(s) every visit Notes: Electronic Signature(s) Signed: 10/19/2021 6:11:26 PM By: Deon Pilling RN, BSN Entered By: Deon Pilling on 10/19/2021 11:48:10 -------------------------------------------------------------------------------- Pain Assessment Details Patient Name: Date of Service: Nancy [name] 10/19/2021 10:45 A M Medical Record Number: [id number] Patient Account Number: [id number] Date of Birth/Sex: Treating RN: 04/08/1961 (60 y.o. [name] Primary Care Milagro Belmares: Other Clinician: [name] Referring Brydan Downard: Treating Luisalberto Beegle/Extender: Nyra Market in Treatment: 2 Active Problems Location of Pain Severity and Description of Pain Patient Has Paino No Site Locations Pain Management and Medication Current Pain Management: Electronic Signature(s) Signed: 10/19/2021 6:11:26 PM By: Deon Pilling RN, BSN Signed: 10/23/2021 3:53:30 PM By: Sandre Kitty Entered By: Sandre Kitty on 10/19/2021 11:10:06 -------------------------------------------------------------------------------- Patient/Caregiver Education Details Patient Name: Date of Service: [place] 11/3/2022andnbsp10:45 [place] Record Number: [id number] Patient Account Number: [id number] Date of Birth/Gender: Treating RN: 10-22-1961 (60 y.o. [name] Primary Care Physician: [name] Other Clinician: Referring Physician: Treating Physician/Extender: Nyra Market in Treatment: 2 Education Assessment Education Provided To: Patient Education Topics Provided Wound/Skin Impairment: Handouts: Skin Care Do's and Dont's Methods: Explain/Verbal Responses: Reinforcements needed Electronic Signature(s) Signed: 10/19/2021 6:11:26 PM By: Deon Pilling RN, BSN Entered By: Deon Pilling on 10/19/2021 11:48:41 -------------------------------------------------------------------------------- Wound Assessment  Details Patient Name: Date of Service: Nancy [name] 10/19/2021 10:45 A M Medical Record Number: [id number] Patient Account Number: [id number] Date of Birth/Sex: Treating RN: 06-14-1961 (60 y.o. [name], Meta.Reding Primary Care Tillman Kazmierski: [name] Other Clinician: Referring Delania Ferg: Treating Jarious Lyon/Extender: Nyra Market in Treatment: 2 Wound Status Wound Number: 1 Primary Diabetic Wound/Ulcer of the Lower Extremity Etiology: Wound Location: Left, Dorsal Foot Wound Open Wounding Event: Gradually Appeared Status: Date Acquired: 09/04/2021 Comorbid Congestive Heart Failure, Hypertension, Peripheral Arterial Weeks Of Treatment: 2 History: Disease, Type II Diabetes, Dementia, Neuropathy Clustered Wound: No Photos Wound Measurements Length: (cm) 0.4 Width: (cm) 1.9 Depth: (cm) 0.1 Area: (cm) 0.597 Volume: (cm) 0.06 % Reduction in Area: 82.3% % Reduction in Volume: 82.2% Epithelialization: None Tunneling: No Undermining: No Wound Description Classification: Grade 2 Wound Margin: Distinct, outline attached Exudate Amount: Medium Exudate Type: Serosanguineous Exudate Color: red, brown Foul Odor After Cleansing: No Slough/Fibrino Yes Wound Bed Granulation Amount: Medium (34-66%) Exposed Structure Necrotic Amount: Medium (34-66%) Fascia Exposed: No Necrotic Quality: Adherent Slough Fat Layer (Subcutaneous Tissue) Exposed: Yes Tendon Exposed: No Muscle  Exposed: No Joint Exposed: No Bone Exposed: No Treatment Notes Wound #1 (Foot) Wound Laterality: Dorsal, Left Cleanser Normal Saline Discharge Instruction: Cleanse the wound with Normal Saline prior to applying a clean dressing using gauze sponges, not tissue or cotton balls. Soap and Water Discharge Instruction: May shower and wash wound with dial antibacterial soap and water prior to dressing change. Wound Cleanser Discharge Instruction: Cleanse the wound with wound cleanser prior to applying a clean dressing using gauze sponges, not tissue or cotton balls. Peri-Wound Care Topical Primary Dressing Promogran Prisma Matrix, 4.34 (sq in) (silver collagen) Discharge Instruction: Moisten collagen with saline or hydrogel Secondary Dressing Woven Gauze Sponge, Non-Sterile 4x4 in Discharge Instruction: Apply over primary dressing as directed. ABD Pad, 5x9 Discharge Instruction: Apply over primary dressing as directed. Secured With [employer], 4.5x3.1 (in/yd) Discharge Instruction: Secure with Kerlix as directed. 74M Medipore H Soft Cloth Surgical T ape, 4 x 10 (in/yd) Discharge Instruction: Secure with tape as directed. Compression Wrap Compression Stockings Add-Ons Electronic Signature(s) Signed: 10/19/2021 6:11:26 PM By: Deon Pilling RN, BSN Entered By: Deon Pilling on 10/19/2021 11:31:16 -------------------------------------------------------------------------------- Wound Assessment Details Patient Name: Date of Service: Nancy [name] 10/19/2021 10:45 A M Medical Record Number: [id number] Patient Account Number: [id number] Date of Birth/Sex: Treating RN: [birth year] (60 y.o. [name] Primary Care Collier Bohnet: [name] Other Clinician: Referring Bill Mcvey: Treating Sharna Gabrys/Extender: Nyra Market in Treatment: 2 Wound Status Wound Number: 2 Primary Diabetic Wound/Ulcer of the Lower Extremity Etiology: Wound  Location: Left Calcaneus Wound Open Wounding Event: Gradually Appeared Status: Date Acquired: 08/04/2021 Comorbid Congestive Heart Failure, Hypertension, Peripheral Arterial Weeks Of Treatment: 2 History: Disease, Type II Diabetes, Dementia, Neuropathy Clustered Wound: No Photos Wound Measurements Length: (cm) 5 Width: (cm) 5.5 Depth: (cm) 1.4 Area: (cm) 21.598 Volume: (cm) 30.238 Wound Description Classification: Unable to visualize wound bed Wound Margin: Distinct, outline attached Exudate Amount: Large Exudate Type: Serosanguineous Exudate Color: red, brown Foul Odor After Cleansing: Slough/Fibrino % Reduction in Area: 5.2% % Reduction in Volume: -1227.4% Epithelialization: Small (1-33%) Tunneling: No Undermining: No No Yes Wound Bed Granulation Amount: None Present (0%) Exposed Structure Necrotic Amount: Large (67-100%) Fascia Exposed: No Necrotic Quality: Eschar, Adherent Slough Fat Layer (Subcutaneous Tissue) Exposed: No Tendon Exposed: No Muscle Exposed: No Joint Exposed: No Bone Exposed: No Treatment Notes Wound #2 (  Calcaneus) Wound Laterality: Left Cleanser Normal Saline Discharge Instruction: Cleanse the wound with Normal Saline prior to applying a clean dressing using gauze sponges, not tissue or cotton balls. Soap and Water Discharge Instruction: May shower and wash wound with dial antibacterial soap and water prior to dressing change. Wound Cleanser Discharge Instruction: Cleanse the wound with wound cleanser prior to applying a clean dressing using gauze sponges, not tissue or cotton balls. Peri-Wound Care Topical Primary Dressing 1/4 strength Dakin's wet to dry Discharge Instruction: apply Dakin's wet to dry apply directly to wound bed. Secondary Dressing Woven Gauze Sponge, Non-Sterile 4x4 in Discharge Instruction: Apply over primary dressing as directed. ABD Pad, 5x9 Discharge Instruction: Apply over primary dressing as directed. Secured  With [employer], 4.5x3.1 (in/yd) Discharge Instruction: Secure with Kerlix as directed. 39M Medipore H Soft Cloth Surgical T ape, 4 x 10 (in/yd) Discharge Instruction: Secure with tape as directed. Compression Wrap Compression Stockings Add-Ons Electronic Signature(s) Signed: 10/19/2021 6:11:26 PM By: Deon Pilling RN, BSN Entered By: Deon Pilling on 10/19/2021 11:31:59 -------------------------------------------------------------------------------- Wound Assessment Details Patient Name: Date of Service: Nancy [name] 10/19/2021 10:45 A M Medical Record Number: [id number] Patient Account Number: [id number] Date of Birth/Sex: Treating RN: [birth year] (60 y.o. [name], Meta.Reding Primary Care Jaaron Oleson: [name] Other Clinician: Referring Miryah Ralls: Treating Giavonna Pflum/Extender: Nyra Market in Treatment: 2 Wound Status Wound Number: 3 Primary Diabetic Wound/Ulcer of the Lower Extremity Etiology: Wound Location: Left, Lateral Foot Wound Open Wounding Event: Gradually Appeared Status: Date Acquired: 08/04/2021 Comorbid Congestive Heart Failure, Hypertension, Peripheral Arterial Weeks Of Treatment: 2 History: Disease, Type II Diabetes, Dementia, Neuropathy Clustered Wound: No Photos Wound Measurements Length: (cm) 2 Width: (cm) 3.2 Depth: (cm) 0.3 Area: (cm) 5.027 Volume: (cm) 1.508 % Reduction in Area: 0% % Reduction in Volume: -199.8% Epithelialization: None Tunneling: No Undermining: No Wound Description Classification: Unable to visualize wound bed Wound Margin: Distinct, outline attached Exudate Amount: Medium Exudate Type: Serosanguineous Exudate Color: red, brown Foul Odor After Cleansing: No Slough/Fibrino Yes Wound Bed Granulation Amount: None Present (0%) Exposed Structure Necrotic Amount: Large (67-100%) Fascia Exposed: No Necrotic Quality: Eschar, Adherent Slough Fat Layer (Subcutaneous Tissue) Exposed:  No Tendon Exposed: No Muscle Exposed: No Joint Exposed: No Bone Exposed: No Treatment Notes Wound #3 (Foot) Wound Laterality: Left, Lateral Cleanser Normal Saline Discharge Instruction: Cleanse the wound with Normal Saline prior to applying a clean dressing using gauze sponges, not tissue or cotton balls. Soap and Water Discharge Instruction: May shower and wash wound with dial antibacterial soap and water prior to dressing change. Wound Cleanser Discharge Instruction: Cleanse the wound with wound cleanser prior to applying a clean dressing using gauze sponges, not tissue or cotton balls. Peri-Wound Care Topical Primary Dressing 1/4 strength Dakin's wet to dry Discharge Instruction: apply Dakin's wet to dry apply directly to wound bed. Secondary Dressing Woven Gauze Sponge, Non-Sterile 4x4 in Discharge Instruction: Apply over primary dressing as directed. ABD Pad, 5x9 Discharge Instruction: Apply over primary dressing as directed. Secured With [employer], 4.5x3.1 (in/yd) Discharge Instruction: Secure with Kerlix as directed. 39M Medipore H Soft Cloth Surgical T ape, 4 x 10 (in/yd) Discharge Instruction: Secure with tape as directed. Compression Wrap Compression Stockings Add-Ons Electronic Signature(s) Signed: 10/19/2021 6:11:26 PM By: Deon Pilling RN, BSN Entered By: Deon Pilling on 10/19/2021 11:32:46 -------------------------------------------------------------------------------- Vitals Details Patient Name: Date of Service: Nancy [name] 10/19/2021 10:45 A M Medical Record Number: [id number] Patient Account  Number: [id number] Date of Birth/Sex: Treating RN: April 11, 1961 (60 y.o. [name], Meta.Reding Primary Care Mauri Tolen: [name] Other Clinician: Referring Seham Gardenhire: Treating Rueben Kassim/Extender: Nyra Market in Treatment: 2 Vital Signs Time Taken: 11:09 Temperature (F): 99.0 Height (in): 63 Pulse (bpm):  82 Weight (lbs): 232 Respiratory Rate (breaths/min): 17 Body Mass Index (BMI): 41.1 Blood Pressure (mmHg): 142/64 Capillary Blood Glucose (mg/dl): 178 Reference Range: 80 - 120 mg / dl Electronic Signature(s) Signed: 10/23/2021 3:53:30 PM By: Sandre Kitty Entered By: Sandre Kitty on 10/19/2021 11:09:53

## 2021-10-23 NOTE — Progress Notes (Signed)
Paramedicine Encounter    Patient ID: Nancy [name], female    DOB: 1961-04-21, 60 y.o.   MRN: [id number]   Patient Care Team: [name], MD as PCP - General (Internal Medicine) [name], MD as PCP - Cardiology (Cardiology) Larey Dresser, MD as PCP - Advanced Heart Failure (Cardiology)  Patient Active Problem List   Diagnosis Date Noted   Acute cardiogenic pulmonary edema ([place]) 10/06/2021   Coronary artery disease involving native coronary artery of native heart without angina pectoris 10/06/2021   Goals of care, counseling/discussion    Gangrene of left foot ([place])    Cellulitis of left foot    Diabetic ulcer of left foot ([place]) 09/04/2021   CHF (congestive heart failure) ([place]) 07/07/2021   Normocytic anemia 06/30/2021   CKD (chronic kidney disease), stage IV ([place]) 06/11/2021   Acute on chronic diastolic CHF (congestive heart failure) ([place]) 06/10/2021   Abnormal nuclear stress test    Dyspnea on exertion 03/07/2021   Orthopnea 02/25/2021   History of cerebrovascular accident (CVA) with residual deficit 05/06/2020   Diabetic peripheral neuropathy ([place]) 04/26/2020   AKI (acute kidney injury) (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   Type 2 diabetes mellitus with stage 4 chronic kidney disease, with long-term current use of insulin ([place]) 10/29/2017   History of macular degeneration 10/29/2017   Chronic cervical pain 10/29/2016   Myalgia 09/14/2016   Insomnia 09/14/2016    Tinea pedis of both feet 08/20/2016   Macular degeneration 08/17/2016   Low back pain 09/21/2014   Hypertensive urgency 09/21/2014   Diabetic gastroparesis associated with type 2 diabetes mellitus ([place]) 09/14/2014   Thyroid nodule 08/17/2014   Obesity (BMI 30-39.9) 08/17/2014   Nausea & vomiting 08/04/2014   Type II diabetes mellitus with neurological manifestations, uncontrolled 08/04/2014    Current Outpatient Medications:    acetaminophen (TYLENOL) 500 MG tablet, Take 500 mg by mouth every 6 (six) hours as needed for moderate pain or headache., Disp: , Rfl:    amLODipine (NORVASC) 10 MG tablet, Take 1 tablet (10 mg total) by mouth daily., Disp: 30 tablet, Rfl: 3   aspirin 81 MG EC tablet, Take 1 tablet (81 mg total) by mouth daily., Disp: 60 tablet, Rfl: 1   atorvastatin (LIPITOR) 40 MG tablet, Take 1 tablet (40 mg total) by mouth daily., Disp: 30 tablet, Rfl: 4   Blood Glucose Monitoring Suppl (ONETOUCH VERIO) w/Device KIT, Check blood sugar three times daily. E11.40, Disp: 1 kit, Rfl: 0   calcium acetate (PHOSLO) 667 MG capsule, Take 1 capsule (667 mg total) by mouth 3 (three) times daily with meals., Disp: 90 capsule, Rfl: 0   carvedilol (COREG) 12.5 MG tablet, Take 1.5 tablets (18.75 mg total) by mouth 2 (two) times daily with a meal., Disp: 90 tablet, Rfl: 5   ferrous sulfate 325 (65 FE) MG tablet, Take 1 tablet (325 mg total) by mouth daily with breakfast., Disp: 100 tablet, Rfl: 1   glucose blood test strip, Use as instructedCheck blood sugar three times daily. E11.40, Disp:  100 each, Rfl: 12   insulin glargine (LANTUS) 100 UNIT/ML Solostar Pen, Inject 30 Units into the skin at bedtime., Disp: 15 mL, Rfl: 3   insulin lispro (HUMALOG KWIKPEN) 100 UNIT/ML KwikPen, Inject 4 units before breakfast and dinner. (Patient taking differently: Inject 4 Units into the skin every evening.), Disp: 15 mL, Rfl: 3   Insulin Pen Needle 32G X 4 MM MISC, Use to inject insulin as directed., Disp: 100  each, Rfl: 0   risperiDONE (RISPERDAL) 3 MG tablet, TAKE 1 TABLET (3 MG TOTAL) BY MOUTH AT BEDTIME., Disp: 30 tablet, Rfl: 2   torsemide (DEMADEX) 20 MG tablet, Take 3 tablets (60 mg total) by mouth daily., Disp: 90 tablet, Rfl: 0 Allergies  Allergen Reactions   Elemental Sulfur Hives and Other (See Comments)    PATIENT STATED THIS, MORE THAN LIKELY, SHOULD HAVE BEEN LOGGED AS "SULFA"   Hydralazine Hcl Other (See Comments)    Hair loss   Hydrocodone Itching and Other (See Comments)    Upset stomach   Metformin And Related Nausea And Vomiting and Other (See Comments)    Stomach pains, also   Other Nausea Only and Other (See Comments)    Lettuce- Does not digest this!!   Plaquenil [Hydroxychloroquine Sulfate] Hives   Shellfish-Derived Products Nausea Only and Other (See Comments)    Caused an upset stomach   Shrimp (Diagnostic) Nausea Only and Other (See Comments)    Upset stomach    Sulfa Antibiotics Hives     Social History   Socioeconomic History   Marital status: Legally Separated    Spouse name: [name]   Number of children: 1   Years of education: some colle   Highest education level: Not on file  Occupational History   Occupation: Employed FT as Landscape architect    Comment: [employer] , NA  Tobacco Use   Smoking status: Former    Packs/day: 0.25    Years: 0.50    Pack years: 0.13    Types: Cigarettes   Smokeless tobacco: Never   Tobacco comments:    quit yrs ago  Vaping Use   Vaping Use: Never used  Substance and Sexual Activity   Alcohol use: No   Drug use: No   Sexual activity: Yes    Birth control/protection: None  Other Topics Concern   Not on file  Social History Narrative   02/18/20 lives alone     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    Social Determinants of Health   Financial Resource Strain: Low Risk    Difficulty of Paying Living Expenses: Not very hard  Food Insecurity: No Food Insecurity   Worried About  Charity fundraiser in the Last Year: Never true   Ran Out of Food in the Last Year: Never true  Transportation Needs: No Transportation Needs   Lack of Transportation (Medical): No   Lack of Transportation (Non-Medical): No  Physical Activity: Not on file  Stress: Not on file  Social Connections: Not on file  Intimate Partner Violence: Not on file    Physical Exam Vitals reviewed.  Constitutional:      Appearance: Normal appearance. She is normal weight.  HENT:     Head: Normocephalic.     Nose: Nose normal.     Mouth/Throat:     Mouth: Mucous membranes are moist.     Pharynx: Oropharynx is clear.  Eyes:     Conjunctiva/sclera: Conjunctivae normal.  Pupils: Pupils are equal, round, and reactive to light.  Cardiovascular:     Rate and Rhythm: Normal rate and regular rhythm.     Pulses: Normal pulses.     Heart sounds: Normal heart sounds.  Pulmonary:     Effort: Pulmonary effort is normal.     Breath sounds: Normal breath sounds.  Abdominal:     General: Abdomen is flat.     Palpations: Abdomen is soft.  Musculoskeletal:        General: Swelling present. Normal range of motion.     Cervical back: Normal range of motion.     Right lower leg: Edema present.     Left lower leg: Edema present.  Skin:    General: Skin is warm and dry.     Capillary Refill: Capillary refill takes less than 2 seconds.  Neurological:     General: No focal deficit present.     Mental Status: She is alert. Mental status is at baseline.  Psychiatric:        Mood and Affect: Mood normal.   Arrived for initial paramedicine visit for [name] who was alert and oriented seated in a chair in the living room of their [address] apartment. [place] husband is present and reports to assist with her care.   Tallia reports having shortness of breath while sitting, she does not walk much. Her bedside toilet is placed in front of her chair where she stands and pivots to use the bathroom. Her  medications are on a table bedside her chair. There is another table to the left of her with various snacks and food items with a small cooler with ice with canned drinks.   Tesha reports she has a wound to the left foot which is being treated by wound care at this time. Her husband says he cleans and wraps it at home. She has a boot on it at present. Both legs appear swollen.   Lungs clear, no abdominal swelling or JVD noted. No present signs of respiratory distress.   Medications verified and confirmed. We will set up pill box next week during home visit.   Talita reports getting her medications at [facility], but is open to the idea of getting them at [facility] for waived medicaid copays. I will look into this for her. She reports getting HF meds at HF fund I will look into this also.   Currently patient reports getting disability check which is $1979 per month. Her husband works in [place] and gets $3200 per month.   She says they can afford rent at $1,000 per month.   Her medicines currently are $40  She is interested in renting a home, I will print off options and bring to next visit.   Ranyia had no other needs at this time, I will be back in one week for visit. Appointments reviewed and confirmed.   Refills: NONE   CBG- [phone]     Future Appointments  Date Time Provider [place]  11/02/2021 10:30 AM Ricard Dillon, MD [facility]  11/03/2021 10:30 AM [facility] PA/NP [facility] None  01/05/2022 12:00 PM Larey Dresser, MD [facility] None     ACTION: Home visit completed

## 2021-10-24 ENCOUNTER — Inpatient Hospital Stay (HOSPITAL_COMMUNITY)
Admission: EM | Admit: 2021-10-24 | Discharge: 2021-11-01 | DRG: 264 | Disposition: A | Discharge status: Home Health | Payer: Medicaid Other | Attending: Internal Medicine | Admitting: Internal Medicine

## 2021-10-24 ENCOUNTER — Telehealth: Payer: Self-pay

## 2021-10-24 ENCOUNTER — Encounter (HOSPITAL_COMMUNITY): Payer: Self-pay | Admitting: Internal Medicine

## 2021-10-24 ENCOUNTER — Emergency Department (HOSPITAL_COMMUNITY): Payer: Medicaid Other

## 2021-10-24 DIAGNOSIS — I6523 Occlusion and stenosis of bilateral carotid arteries: Secondary | ICD-10-CM | POA: Diagnosis present

## 2021-10-24 DIAGNOSIS — N184 Chronic kidney disease, stage 4 (severe): Secondary | ICD-10-CM | POA: Diagnosis present

## 2021-10-24 DIAGNOSIS — Z91013 Allergy to seafood: Secondary | ICD-10-CM

## 2021-10-24 DIAGNOSIS — I69319 Unspecified symptoms and signs involving cognitive functions following cerebral infarction: Secondary | ICD-10-CM

## 2021-10-24 DIAGNOSIS — E1152 Type 2 diabetes mellitus with diabetic peripheral angiopathy with gangrene: Secondary | ICD-10-CM | POA: Diagnosis present

## 2021-10-24 DIAGNOSIS — E1143 Type 2 diabetes mellitus with diabetic autonomic (poly)neuropathy: Secondary | ICD-10-CM | POA: Diagnosis present

## 2021-10-24 DIAGNOSIS — E11621 Type 2 diabetes mellitus with foot ulcer: Secondary | ICD-10-CM | POA: Diagnosis present

## 2021-10-24 DIAGNOSIS — I130 Hypertensive heart and chronic kidney disease with heart failure and stage 1 through stage 4 chronic kidney disease, or unspecified chronic kidney disease: Principal | ICD-10-CM | POA: Diagnosis present

## 2021-10-24 DIAGNOSIS — M869 Osteomyelitis, unspecified: Secondary | ICD-10-CM

## 2021-10-24 DIAGNOSIS — I96 Gangrene, not elsewhere classified: Secondary | ICD-10-CM | POA: Diagnosis present

## 2021-10-24 DIAGNOSIS — Z83438 Family history of other disorder of lipoprotein metabolism and other lipidemia: Secondary | ICD-10-CM

## 2021-10-24 DIAGNOSIS — I69354 Hemiplegia and hemiparesis following cerebral infarction affecting left non-dominant side: Secondary | ICD-10-CM

## 2021-10-24 DIAGNOSIS — Z800 Family history of malignant neoplasm of digestive organs: Secondary | ICD-10-CM

## 2021-10-24 DIAGNOSIS — L97422 Non-pressure chronic ulcer of left heel and midfoot with fat layer exposed: Secondary | ICD-10-CM | POA: Diagnosis not present

## 2021-10-24 DIAGNOSIS — D631 Anemia in chronic kidney disease: Secondary | ICD-10-CM | POA: Diagnosis present

## 2021-10-24 DIAGNOSIS — E1142 Type 2 diabetes mellitus with diabetic polyneuropathy: Secondary | ICD-10-CM | POA: Diagnosis present

## 2021-10-24 DIAGNOSIS — L97529 Non-pressure chronic ulcer of other part of left foot with unspecified severity: Secondary | ICD-10-CM | POA: Diagnosis present

## 2021-10-24 DIAGNOSIS — E1122 Type 2 diabetes mellitus with diabetic chronic kidney disease: Secondary | ICD-10-CM | POA: Diagnosis present

## 2021-10-24 DIAGNOSIS — Z20822 Contact with and (suspected) exposure to covid-19: Secondary | ICD-10-CM | POA: Diagnosis present

## 2021-10-24 DIAGNOSIS — M86271 Subacute osteomyelitis, right ankle and foot: Secondary | ICD-10-CM

## 2021-10-24 DIAGNOSIS — L97428 Non-pressure chronic ulcer of left heel and midfoot with other specified severity: Secondary | ICD-10-CM | POA: Diagnosis present

## 2021-10-24 DIAGNOSIS — Z794 Long term (current) use of insulin: Secondary | ICD-10-CM

## 2021-10-24 DIAGNOSIS — L97518 Non-pressure chronic ulcer of other part of right foot with other specified severity: Secondary | ICD-10-CM | POA: Diagnosis present

## 2021-10-24 DIAGNOSIS — Z8249 Family history of ischemic heart disease and other diseases of the circulatory system: Secondary | ICD-10-CM

## 2021-10-24 DIAGNOSIS — Z882 Allergy status to sulfonamides status: Secondary | ICD-10-CM

## 2021-10-24 DIAGNOSIS — J9601 Acute respiratory failure with hypoxia: Secondary | ICD-10-CM | POA: Diagnosis present

## 2021-10-24 DIAGNOSIS — I509 Heart failure, unspecified: Secondary | ICD-10-CM

## 2021-10-24 DIAGNOSIS — D509 Iron deficiency anemia, unspecified: Secondary | ICD-10-CM | POA: Diagnosis present

## 2021-10-24 DIAGNOSIS — I2510 Atherosclerotic heart disease of native coronary artery without angina pectoris: Secondary | ICD-10-CM | POA: Diagnosis present

## 2021-10-24 DIAGNOSIS — Z6841 Body Mass Index (BMI) 40.0 and over, adult: Secondary | ICD-10-CM | POA: Diagnosis not present

## 2021-10-24 DIAGNOSIS — E785 Hyperlipidemia, unspecified: Secondary | ICD-10-CM | POA: Diagnosis present

## 2021-10-24 DIAGNOSIS — J810 Acute pulmonary edema: Secondary | ICD-10-CM

## 2021-10-24 DIAGNOSIS — M86372 Chronic multifocal osteomyelitis, left ankle and foot: Secondary | ICD-10-CM | POA: Diagnosis not present

## 2021-10-24 DIAGNOSIS — Z888 Allergy status to other drugs, medicaments and biological substances status: Secondary | ICD-10-CM

## 2021-10-24 DIAGNOSIS — I739 Peripheral vascular disease, unspecified: Secondary | ICD-10-CM

## 2021-10-24 DIAGNOSIS — L97426 Non-pressure chronic ulcer of left heel and midfoot with bone involvement without evidence of necrosis: Secondary | ICD-10-CM | POA: Diagnosis not present

## 2021-10-24 DIAGNOSIS — M86672 Other chronic osteomyelitis, left ankle and foot: Secondary | ICD-10-CM | POA: Diagnosis present

## 2021-10-24 DIAGNOSIS — E119 Type 2 diabetes mellitus without complications: Secondary | ICD-10-CM

## 2021-10-24 DIAGNOSIS — R442 Other hallucinations: Secondary | ICD-10-CM | POA: Diagnosis present

## 2021-10-24 DIAGNOSIS — Z79899 Other long term (current) drug therapy: Secondary | ICD-10-CM

## 2021-10-24 DIAGNOSIS — E1169 Type 2 diabetes mellitus with other specified complication: Secondary | ICD-10-CM | POA: Diagnosis present

## 2021-10-24 DIAGNOSIS — I10 Essential (primary) hypertension: Secondary | ICD-10-CM

## 2021-10-24 DIAGNOSIS — L97509 Non-pressure chronic ulcer of other part of unspecified foot with unspecified severity: Secondary | ICD-10-CM | POA: Diagnosis present

## 2021-10-24 DIAGNOSIS — Z7982 Long term (current) use of aspirin: Secondary | ICD-10-CM

## 2021-10-24 DIAGNOSIS — Z803 Family history of malignant neoplasm of breast: Secondary | ICD-10-CM

## 2021-10-24 DIAGNOSIS — I5033 Acute on chronic diastolic (congestive) heart failure: Secondary | ICD-10-CM | POA: Diagnosis present

## 2021-10-24 DIAGNOSIS — Z833 Family history of diabetes mellitus: Secondary | ICD-10-CM

## 2021-10-24 DIAGNOSIS — Z8349 Family history of other endocrine, nutritional and metabolic diseases: Secondary | ICD-10-CM

## 2021-10-24 DIAGNOSIS — K3184 Gastroparesis: Secondary | ICD-10-CM | POA: Diagnosis present

## 2021-10-24 DIAGNOSIS — Z87891 Personal history of nicotine dependence: Secondary | ICD-10-CM

## 2021-10-24 LAB — I-STAT VENOUS BLOOD GAS, ED
Acid-Base Excess: 2 mmol/L (ref 0.0–2.0)
Bicarbonate: 25.2 mmol/L (ref 20.0–28.0)
Calcium, Ion: 1.03 mmol/L — ABNORMAL LOW (ref 1.15–1.40)
HCT: 29 % — ABNORMAL LOW (ref 36.0–46.0)
Hemoglobin: 9.9 g/dL — ABNORMAL LOW (ref 12.0–15.0)
O2 Saturation: 86 %
Potassium: 4.2 mmol/L (ref 3.5–5.1)
Sodium: 135 mmol/L (ref 135–145)
TCO2: 26 mmol/L (ref 22–32)
pCO2, Ven: 33.2 mmHg — ABNORMAL LOW (ref 44.0–60.0)
pH, Ven: 7.487 — ABNORMAL HIGH (ref 7.250–7.430)
pO2, Ven: 46 mmHg — ABNORMAL HIGH (ref 32.0–45.0)

## 2021-10-24 LAB — CBC WITH DIFFERENTIAL/PLATELET
Abs Immature Granulocytes: 0.03 10*3/uL (ref 0.00–0.07)
Basophils Absolute: 0.1 10*3/uL (ref 0.0–0.1)
Basophils Relative: 1 %
Eosinophils Absolute: 0.7 10*3/uL — ABNORMAL HIGH (ref 0.0–0.5)
Eosinophils Relative: 6 %
HCT: 30 % — ABNORMAL LOW (ref 36.0–46.0)
Hemoglobin: 9.1 g/dL — ABNORMAL LOW (ref 12.0–15.0)
Immature Granulocytes: 0 %
Lymphocytes Relative: 11 %
Lymphs Abs: 1.4 10*3/uL (ref 0.7–4.0)
MCH: 28 pg (ref 26.0–34.0)
MCHC: 30.3 g/dL (ref 30.0–36.0)
MCV: 92.3 fL (ref 80.0–100.0)
Monocytes Absolute: 1 10*3/uL (ref 0.1–1.0)
Monocytes Relative: 8 %
Neutro Abs: 9.4 10*3/uL — ABNORMAL HIGH (ref 1.7–7.7)
Neutrophils Relative %: 74 %
Platelets: 221 10*3/uL (ref 150–400)
RBC: 3.25 MIL/uL — ABNORMAL LOW (ref 3.87–5.11)
RDW: 14.5 % (ref 11.5–15.5)
WBC: 12.6 10*3/uL — ABNORMAL HIGH (ref 4.0–10.5)
nRBC: 0 % (ref 0.0–0.2)

## 2021-10-24 LAB — BASIC METABOLIC PANEL
Anion gap: 11 (ref 5–15)
BUN: 33 mg/dL — ABNORMAL HIGH (ref 8–23)
CO2: 23 mmol/L (ref 22–32)
Calcium: 8.8 mg/dL — ABNORMAL LOW (ref 8.9–10.3)
Chloride: 99 mmol/L (ref 98–111)
Creatinine, Ser: 3.12 mg/dL — ABNORMAL HIGH (ref 0.44–1.00)
GFR, Estimated: 16 mL/min — ABNORMAL LOW (ref >60)
Glucose, Bld: 182 mg/dL — ABNORMAL HIGH (ref 70–99)
Potassium: 4.3 mmol/L (ref 3.5–5.1)
Sodium: 133 mmol/L — ABNORMAL LOW (ref 135–145)

## 2021-10-24 LAB — RESP PANEL BY RT-PCR (FLU A&B, COVID) ARPGX2
Influenza A by PCR: NEGATIVE
Influenza B by PCR: NEGATIVE
SARS Coronavirus 2 by RT PCR: NEGATIVE

## 2021-10-24 LAB — TROPONIN I (HIGH SENSITIVITY): Troponin I (High Sensitivity): 8 ng/L (ref <18)

## 2021-10-24 MED ORDER — ASPIRIN 81 MG PO CHEW
81.0000 mg | CHEWABLE_TABLET | Freq: Every day | ORAL | Status: DC
  Administered 2021-10-25 – 2021-11-01 (×8): 81 mg via ORAL
  Filled 2021-10-24 (×8): qty 1

## 2021-10-24 MED ORDER — ACETAMINOPHEN 325 MG PO TABS
650.0000 mg | ORAL_TABLET | Freq: Four times a day (QID) | ORAL | Status: DC | PRN
  Administered 2021-10-25: 650 mg via ORAL
  Filled 2021-10-24: qty 2

## 2021-10-24 MED ORDER — AMLODIPINE BESYLATE 10 MG PO TABS
10.0000 mg | ORAL_TABLET | Freq: Every day | ORAL | Status: DC
  Administered 2021-10-25 – 2021-11-01 (×8): 10 mg via ORAL
  Filled 2021-10-24 (×8): qty 1

## 2021-10-24 MED ORDER — RISPERIDONE 1 MG PO TABS
3.0000 mg | ORAL_TABLET | Freq: Every day | ORAL | Status: DC
  Administered 2021-10-25 – 2021-10-31 (×7): 3 mg via ORAL
  Filled 2021-10-24 (×7): qty 3

## 2021-10-24 MED ORDER — CALCIUM ACETATE (PHOS BINDER) 667 MG PO CAPS
667.0000 mg | ORAL_CAPSULE | Freq: Three times a day (TID) | ORAL | Status: DC
  Administered 2021-10-25 – 2021-11-01 (×22): 667 mg via ORAL
  Filled 2021-10-24 (×22): qty 1

## 2021-10-24 MED ORDER — ENOXAPARIN SODIUM 30 MG/0.3ML IJ SOSY
30.0000 mg | PREFILLED_SYRINGE | INTRAMUSCULAR | Status: DC
  Administered 2021-10-25 – 2021-10-31 (×7): 30 mg via SUBCUTANEOUS
  Filled 2021-10-24 (×6): qty 0.3

## 2021-10-24 MED ORDER — ACETAMINOPHEN 650 MG RE SUPP: 650.0000 mg | Freq: Four times a day (QID) | RECTAL | Status: DC | PRN

## 2021-10-24 MED ORDER — NITROGLYCERIN 2 % TD OINT
1.0000 [in_us] | TOPICAL_OINTMENT | Freq: Once | TRANSDERMAL | Status: AC
  Administered 2021-10-24: 1 [in_us] via TOPICAL
  Filled 2021-10-24: qty 1

## 2021-10-24 MED ORDER — CARVEDILOL 6.25 MG PO TABS: 18.7500 mg | ORAL_TABLET | Freq: Two times a day (BID) | ORAL | Status: DC

## 2021-10-24 MED ORDER — INSULIN ASPART 100 UNIT/ML IJ SOLN
0.0000 [IU] | Freq: Three times a day (TID) | INTRAMUSCULAR | Status: DC
  Administered 2021-10-25: 3 [IU] via SUBCUTANEOUS
  Administered 2021-10-25: 2 [IU] via SUBCUTANEOUS
  Administered 2021-10-26: 3 [IU] via SUBCUTANEOUS
  Administered 2021-10-26: 2 [IU] via SUBCUTANEOUS
  Administered 2021-10-27: 5 [IU] via SUBCUTANEOUS
  Administered 2021-10-28: 3 [IU] via SUBCUTANEOUS
  Administered 2021-10-28: 2 [IU] via SUBCUTANEOUS
  Administered 2021-10-29: 3 [IU] via SUBCUTANEOUS
  Administered 2021-10-29 – 2021-10-30 (×2): 2 [IU] via SUBCUTANEOUS
  Administered 2021-10-30: 11 [IU] via SUBCUTANEOUS
  Administered 2021-10-31: 5 [IU] via SUBCUTANEOUS
  Administered 2021-10-31: 2 [IU] via SUBCUTANEOUS
  Administered 2021-11-01: 3 [IU] via SUBCUTANEOUS
  Administered 2021-11-01: 2 [IU] via SUBCUTANEOUS

## 2021-10-24 MED ORDER — FERROUS SULFATE 325 (65 FE) MG PO TABS
325.0000 mg | ORAL_TABLET | Freq: Every day | ORAL | Status: DC
  Administered 2021-10-25 – 2021-11-01 (×8): 325 mg via ORAL
  Filled 2021-10-24 (×8): qty 1

## 2021-10-24 MED ORDER — ATORVASTATIN CALCIUM 40 MG PO TABS
40.0000 mg | ORAL_TABLET | Freq: Every day | ORAL | Status: DC
  Administered 2021-10-25 – 2021-11-01 (×8): 40 mg via ORAL
  Filled 2021-10-24 (×8): qty 1

## 2021-10-24 MED ORDER — INSULIN GLARGINE-YFGN 100 UNIT/ML ~~LOC~~ SOLN
15.0000 [IU] | Freq: Every day | SUBCUTANEOUS | Status: DC
  Administered 2021-10-25 – 2021-10-31 (×7): 15 [IU] via SUBCUTANEOUS
  Filled 2021-10-24 (×11): qty 0.15

## 2021-10-24 MED ORDER — FUROSEMIDE 10 MG/ML IJ SOLN
20.0000 mg | Freq: Once | INTRAMUSCULAR | Status: AC
  Administered 2021-10-25: 20 mg via INTRAVENOUS
  Filled 2021-10-24: qty 2

## 2021-10-24 MED ORDER — FUROSEMIDE 10 MG/ML IJ SOLN
60.0000 mg | Freq: Once | INTRAMUSCULAR | Status: AC
  Administered 2021-10-24: 60 mg via INTRAVENOUS
  Filled 2021-10-24: qty 6

## 2021-10-24 NOTE — Telephone Encounter (Signed)
Contacted pt to go over lab results pt is aware and doesn't have any questions or concerns 

## 2021-10-24 NOTE — ED Triage Notes (Signed)
Pt from home via [facility] as resp distress; initial sat w fire 72% on RA, 99% on NRB. Hx CHF, reports increased swelling to extremities x "awhile," states this feels similar to CHF exacerbation. No meds PTA Clear upper lungs, diminished lower

## 2021-10-24 NOTE — Progress Notes (Signed)
Patient arrived via EMS on NRB mask.  Upon arrival patient's sats were noted to be 100% however patient did have prolonged exhalation.  Patient auscultated with fine crackles noted at upper bases.  Patient was placed on bipap and states that her breathing feels a little easier at this time.  Tolerating bipap well.  Will continue to monitor.

## 2021-10-24 NOTE — ED Provider Notes (Signed)
[facility] EMERGENCY DEPARTMENT Provider Note   CSN: [id number] Arrival date & time: 10/24/21  1720     History Chief Complaint  Patient presents with   Respiratory Distress    Nancy [name] is a 60 y.o. female.  60 yo F with a chief complaints of shortness of breath.  Going on for the past day.  Patient feels like it is her heart failure again.  Has had problems with this in the past.  Worsening lower extremity edema.  Found to be profoundly hypoxic with EMS into the 70s and started on nonrebreather.  Placed on BiPAP on arrival to the ED.  Patient feels a bit better now.  Denies any chest pain or pressure denies cough or fever.  The history is provided by the patient.  Illness Severity:  Severe Onset quality:  Sudden Duration:  2 hours Timing:  Constant Progression:  Worsening Chronicity:  Recurrent Associated symptoms: shortness of breath   Associated symptoms: no chest pain, no congestion, no fever, no headaches, no myalgias, no nausea, no rhinorrhea, no vomiting and no wheezing       Past Medical History:  Diagnosis Date   Anemia 2006   Depression 2014   previously on amitryptiline    Diabetes mellitus with neurological manifestation ([place]) 2006   Diabetic peripheral neuropathy ([place]) 04/26/2020   Fracture of left ankle 1997    Gastroparesis 07/2016   HOH (hard of hearing) 2004    Hyperlipidemia 2006   Hypertension 2006   IBS (irritable bowel syndrome) 2002    Leukopenia 2015    Macular degeneration 11/2019   Shingles 2009    Stroke [facility]) 2020   Thyroid nodule 2004    Patient Active Problem List   Diagnosis Date Noted   Acute cardiogenic pulmonary edema ([place]) 10/06/2021   Coronary artery disease involving native coronary artery of native heart without angina pectoris 10/06/2021   Goals of care, counseling/discussion    Gangrene of left foot ([place])    Cellulitis of left foot    Diabetic ulcer of left foot ([place]) 09/04/2021   CHF  (congestive heart failure) ([place]) 07/07/2021   Normocytic anemia 06/30/2021   CKD (chronic kidney disease), stage IV ([place]) 06/11/2021   Acute on chronic diastolic CHF (congestive heart failure) ([place]) 06/10/2021   Abnormal nuclear stress test    Dyspnea on exertion 03/07/2021   Orthopnea 02/25/2021   History of cerebrovascular accident (CVA) with residual deficit 05/06/2020   Diabetic peripheral neuropathy ([place]) 04/26/2020   AKI (acute kidney injury) ([place]) 04/20/2020   Generalized weakness 04/20/2020   Dehydration 04/20/2020   Generalized abdominal pain    Pancreatitis, acute 02/29/2020   Cerebrovascular accident (CVA) ([place]) 11/08/2019   Stenosis of right carotid artery 11/08/2019   Mixed diabetic hyperlipidemia associated with type 2 diabetes mellitus ([place]) 11/08/2019   Cortical age-related cataract of both eyes 08/30/2019   Gait abnormality 08/30/2019   Statin declined 08/30/2019   Gastroesophageal reflux disease without esophagitis 04/18/2018   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   Type 2 diabetes mellitus with stage 4 chronic kidney disease, with long-term current use of insulin ([place]) 10/29/2017   History of macular degeneration 10/29/2017   Chronic cervical pain 10/29/2016   Myalgia 09/14/2016   Insomnia 09/14/2016   Tinea pedis of both feet 08/20/2016   Macular degeneration 08/17/2016   Low back pain 09/21/2014   Hypertensive urgency 09/21/2014   Diabetic gastroparesis associated with type 2 diabetes mellitus ([place]) 09/14/2014   Thyroid nodule 08/17/2014  Obesity (BMI 30-39.9) 08/17/2014   Nausea & vomiting 08/04/2014   Type II diabetes mellitus with neurological manifestations, uncontrolled 08/04/2014    Past Surgical History:  Procedure Laterality Date   ABDOMINAL HYSTERECTOMY  2005   CATARACT EXTRACTION Left 11/2019   CESAREAN SECTION  1983    CHOLECYSTECTOMY N/A 03/05/2020   Procedure: LAPAROSCOPIC CHOLECYSTECTOMY WITH INTRAOPERATIVE CHOLANGIOGRAM;  Surgeon: Donnie Mesa,  MD;  Location: [facility];  Service: General;  Laterality: N/A;   ESOPHAGOGASTRODUODENOSCOPY (EGD) WITH PROPOFOL Left 08/26/2014   Procedure: ESOPHAGOGASTRODUODENOSCOPY (EGD) WITH PROPOFOL;  Surgeon: Arta Silence, MD;  Location: [facility];  Service: Endoscopy;  Laterality: Left;   ESOPHAGOGASTRODUODENOSCOPY (EGD) WITH PROPOFOL N/A 03/03/2020   Procedure: ESOPHAGOGASTRODUODENOSCOPY (EGD) WITH PROPOFOL;  Surgeon: Jerene Bears, MD;  Location: [facility];  Service: Gastroenterology;  Laterality: N/A;   RIGHT HEART CATH N/A 07/14/2021   Procedure: RIGHT HEART CATH;  Surgeon: Larey Dresser, MD;  Location: [place] [facility];  Service: Cardiovascular;  Laterality: N/A;   RIGHT/LEFT HEART CATH AND CORONARY ANGIOGRAPHY N/A 04/27/2021   Procedure: RIGHT/LEFT HEART CATH AND CORONARY ANGIOGRAPHY;  Surgeon: [name], MD;  Location: [place] [facility];  Service: Cardiovascular;  Laterality: N/A;     OB History   No obstetric history on file.     Family History  Problem Relation Age of Onset   Hypertension Mother    Heart disease Mother    Diabetes Mother    Thyroid disease Mother    Congestive Heart Failure Mother    Breast cancer Maternal Grandmother    Colon cancer Maternal Grandfather    Heart attack Sister    Heart disease Brother    Hyperlipidemia Brother    Hypertension Brother    Diabetes Father    Breast cancer Maternal Aunt     Social History   Tobacco Use   Smoking status: Former    Packs/day: 0.25    Years: 0.50    Pack years: 0.13    Types: Cigarettes   Smokeless tobacco: Never   Tobacco comments:    quit yrs ago  Vaping Use   Vaping Use: Never used  Substance Use Topics   Alcohol use: No   Drug use: No    Home Medications Prior to Admission medications   Medication Sig Start Date End Date Taking? Authorizing Provider  acetaminophen (TYLENOL) 500 MG tablet Take 500 mg by mouth every 6 (six) hours as needed for moderate pain or headache.    [provider]  amLODipine (NORVASC) 10 MG tablet Take 1 tablet (10 mg total) by mouth daily. 09/27/21   [name], MD  aspirin 81 MG EC tablet Take 1 tablet (81 mg total) by mouth daily. 09/26/21   [place], Maricela Bo, FNP  atorvastatin (LIPITOR) 40 MG tablet Take 1 tablet (40 mg total) by mouth daily. 09/26/21   [place], Maricela Bo, FNP  Blood Glucose Monitoring Suppl (ONETOUCH VERIO) w/Device KIT Check blood sugar three times daily. E11.40 Patient not taking: Reported on 10/23/2021 05/06/20   [name], MD  calcium acetate (PHOSLO) 667 MG capsule Take 1 capsule (667 mg total) by mouth 3 (three) times daily with meals. 10/10/21 11/09/21  Aldine Contes, MD  carvedilol (COREG) 12.5 MG tablet Take 1.5 tablets (18.75 mg total) by mouth 2 (two) times daily with a meal. 10/20/21   [name], MD  ferrous sulfate 325 (65 FE) MG tablet Take 1 tablet (325 mg total) by mouth daily with  breakfast. 08/01/21   [name], MD  glucose blood test strip Use as instructedCheck blood sugar three times daily. E11.40 Patient not taking: Reported on 10/23/2021 07/11/20   [name], MD  insulin glargine (LANTUS) 100 UNIT/ML Solostar Pen Inject 30 Units into the skin at bedtime. 10/10/21   Aldine Contes, MD  insulin lispro (HUMALOG KWIKPEN) 100 UNIT/ML KwikPen Inject 4 units before breakfast and dinner. Patient taking differently: Inject 4 Units into the skin every evening. 07/05/20   Camillia Herter, NP  Insulin Pen Needle 32G X 4 MM MISC Use to inject insulin as directed. 10/09/21   Aldine Contes, MD  risperiDONE (RISPERDAL) 3 MG tablet TAKE 1 TABLET (3 MG TOTAL) BY MOUTH AT BEDTIME. 02/06/21 02/06/22  Eulis Canner E, NP  torsemide (DEMADEX) 20 MG tablet Take 3 tablets (60 mg total) by mouth daily. 10/11/21 11/10/21  Aldine Contes, MD  Insulin NPH Isophane & Regular (RELION 70/30 [place]) Inject 35 Units into the skin 2 (two) times daily.  08/17/14  [provider]    Allergies    Elemental sulfur, Hydralazine hcl, Hydrocodone, Metformin and related, Other, Plaquenil [hydroxychloroquine sulfate], Shellfish-derived products, Shrimp (diagnostic), and Sulfa antibiotics  Review of Systems   Review of Systems  Constitutional:  Negative for chills and fever.  HENT:  Negative for congestion and rhinorrhea.   Eyes:  Negative for redness and visual disturbance.  Respiratory:  Positive for shortness of breath. Negative for wheezing.   Cardiovascular:  Negative for chest pain and palpitations.  Gastrointestinal:  Negative for nausea and vomiting.  Genitourinary:  Negative for dysuria and urgency.  Musculoskeletal:  Negative for arthralgias and myalgias.  Skin:  Negative for pallor and wound.  Neurological:  Negative for dizziness and headaches.   Physical Exam Updated Vital Signs BP (!) 178/80   Pulse 89   Temp 97.8 F (36.6 C) (Tympanic)   Resp (!) 25   SpO2 100%   Physical Exam Vitals and nursing note reviewed.  Constitutional:      General: She is not in acute distress.    Appearance: She is well-developed. She is not diaphoretic.     Comments: BMI 40  HENT:     Head: Normocephalic and atraumatic.  Eyes:     Pupils: Pupils are equal, round, and reactive to light.  Cardiovascular:     Rate and Rhythm: Normal rate and regular rhythm.     Heart sounds: No murmur heard.   No friction rub. No gallop.  Pulmonary:     Effort: Pulmonary effort is normal.     Breath sounds: No wheezing or rales.  Abdominal:     General: There is no distension.     Palpations: Abdomen is soft.     Tenderness: There is no abdominal tenderness.  Musculoskeletal:        General: No tenderness.     Cervical back: Normal range of motion and neck supple.     Right lower leg: Edema present.     Left lower leg: Edema present.     Comments: 4+ edema to the thighs bilaterally.  Skin:    General: Skin is warm and dry.  Neurological:     Mental Status: She is  alert and oriented to person, place, and time.  Psychiatric:        Behavior: Behavior normal.    ED Results / Procedures / Treatments   Labs (all labs ordered are listed, but only abnormal results are displayed) Labs  Reviewed  CBC WITH DIFFERENTIAL/PLATELET - Abnormal; Notable for the following components:      Result Value   WBC 12.6 (*)    RBC 3.25 (*)    Hemoglobin 9.1 (*)    HCT 30.0 (*)    Neutro Abs 9.4 (*)    Eosinophils Absolute 0.7 (*)    All other components within normal limits  BASIC METABOLIC PANEL - Abnormal; Notable for the following components:   Sodium 133 (*)    Glucose, Bld 182 (*)    BUN 33 (*)    Creatinine, Ser 3.12 (*)    Calcium 8.8 (*)    GFR, Estimated 16 (*)    All other components within normal limits  I-STAT VENOUS BLOOD GAS, ED - Abnormal; Notable for the following components:   pH, Ven 7.487 (*)    pCO2, Ven 33.2 (*)    pO2, Ven 46.0 (*)    Calcium, Ion 1.03 (*)    HCT 29.0 (*)    Hemoglobin 9.9 (*)    All other components within normal limits  BRAIN NATRIURETIC PEPTIDE  TROPONIN I (HIGH SENSITIVITY)    EKG EKG Interpretation  Date/Time:  Tuesday October 24 2021 17:26:28 EST Ventricular Rate:  93 PR Interval:  185 QRS Duration: 88 QT Interval:  358 QTC Calculation: 446 R Axis:   -3 Text Interpretation: Sinus rhythm No significant change since last tracing Confirmed by Deno Etienne [phone]) on 10/24/2021 5:34:25 PM  Radiology DG Chest Port 1 View  Result Date: 10/24/2021 CLINICAL DATA:  Shortness of breath. EXAM: PORTABLE CHEST 1 VIEW COMPARISON:  10/09/2021 FINDINGS: 1746 hours. Low volumes. Vascular congestion noted with patchy/nodular bilateral lung opacities. Probable tiny effusions. The visualized bony structures of the thorax show no acute abnormality. Telemetry leads overlie the chest. IMPRESSION: Low volume film with vascular congestion and patchy/nodular bilateral lung opacities. Imaging features likely reflect pulmonary edema  although infection not excluded. Electronically Signed   By: Misty Stanley M.D.   On: 10/24/2021 18:37    Procedures Procedures  Procedure note: Ultrasound Guided Peripheral IV Ultrasound guided peripheral 1.88 inch angiocath IV placement performed by me. Indications: Nursing unable to place IV. Details: The antecubital fossa and upper arm were evaluated with a multifrequency linear probe. Patent brachial veins were noted. 1 attempt was made to cannulate a vein under realtime US guidance with successful cannulation of the vein and catheter placement. There is return of non-pulsatile dark red blood. The patient tolerated the procedure well without complications. Images archived electronically.  CPT codes: [phone] and [phone]  Medications Ordered in ED Medications  nitroGLYCERIN (NITROGLYN) 2 % ointment 1 inch (1 inch Topical Given 10/24/21 1811)  furosemide (LASIX) injection 60 mg (60 mg Intravenous Given 10/24/21 1824)    ED Course  I have reviewed the triage vital signs and the nursing notes.  Pertinent labs & imaging results that were available during my care of the patient were reviewed by me and considered in my medical decision making (see chart for details).    MDM Rules/Calculators/A&P                           60 yo F with a chief complaints of bilateral lower extremity edema shortness of breath.  Has a history of heart failure, feels the same.  Recently admitted a couple weeks ago with a similar presentation.  We will obtain a chest x-ray blood work reassess.  Work-up consistent with acute pulmonary  edema.  Chest x-ray viewed by me with increased interstitial fluid.  Renal function at baseline.  Given 60 of Lasix.  Nitropaste.  Discussed with medicine for admission.  CRITICAL CARE Performed by: Cecilio Asper   Total critical care time: 80 minutes  Critical care time was exclusive of separately billable procedures and treating other patients.  Critical care was necessary  to treat or prevent imminent or life-threatening deterioration.  Critical care was time spent personally by me on the following activities: development of treatment plan with patient and/or surrogate as well as nursing, discussions with consultants, evaluation of patient's response to treatment, examination of patient, obtaining history from patient or surrogate, ordering and performing treatments and interventions, ordering and review of laboratory studies, ordering and review of radiographic studies, pulse oximetry and re-evaluation of patient's condition.  The patients results and plan were reviewed and discussed.   Any x-rays performed were independently reviewed by myself.   Differential diagnosis were considered with the presenting HPI.  Medications  nitroGLYCERIN (NITROGLYN) 2 % ointment 1 inch (1 inch Topical Given 10/24/21 1811)  furosemide (LASIX) injection 60 mg (60 mg Intravenous Given 10/24/21 1824)    Vitals:   10/24/21 1729 10/24/21 1730 10/24/21 1858 10/24/21 1916  BP: (!) 160/92  (!) 178/80   Pulse: 95  91 89  Resp: 20  (!) 26 (!) 25  Temp: 97.8 F (36.6 C)     TempSrc: Tympanic     SpO2: 100% 100% 100% 100%    Final diagnoses:  Acute pulmonary edema (HCC)    Admission/ observation were discussed with the admitting physician, patient and/or family and they are comfortable with the plan.   Final Clinical Impression(s) / ED Diagnoses Final diagnoses:  Acute pulmonary edema [facility])    Rx / DC Orders ED Discharge Orders     None        Deno Etienne, DO 10/24/21 1944

## 2021-10-24 NOTE — ED Notes (Signed)
Got patient into a gown on the monitor did ekg shown to Dr Tyrone Nine patient is resting with call bel in reach and nurse at bedside

## 2021-10-24 NOTE — H&P (Addendum)
Date: 10/24/2021               Patient Name:  Nancy [name] MRN: [id number]  DOB: 22-Oct-1961 Age / Sex: 60 y.o., female   PCP: [name], MD         Medical Service: Internal Medicine Teaching Service         Attending Physician: Dr. Jimmye Norman, Elaina Pattee, MD    First Contact: Dr. Timothy Lasso Pager: [phone]  Second Contact: Dr. Linwood Dibbles Pager: [phone]       After Hours (After 5p/  First Contact Pager: [phone]  weekends / holidays): Second Contact Pager: [phone]   Chief Complaint: SOB  History of Present Illness:   Nancy [name] is a 60 year old African-American female with past medical history of hypertension, hyperlipidemia, bilateral carotid artery stenosis, CVA 2020, HFpEF EF 55 to 60%, CKD stage V, TIA, type 2 diabetes mellitus with diabetic neuropathy, history of gangrenous left heel ulcer, history of tactile hallucinations, mild CAD, anemia of chronic disease, who presents to [facility] ED via EMS for shortness of breath.  Patient reports she was at her baseline until 3 PM today when she was watching TV and began to short of breath.  Her shortness of breath worsened over the course of several hours at which time her husband called EMS.  She reports her shortness of breath feels the same as last time she had heart failure exacerbation. She has also had worsening lower extremity edema for the last several days and feels she has been gaining weight for the last few weeks.  She reports she has been unable to sleep in a bed and has been sleeping in a recliner.  She reports she does not ambulate much at home, mostly sits in her recliner all day.  Her husband helps manage some ADLs.  She reports adherence to her torsemide, she manages her medications.  Denies recent flulike symptoms, fever, cough.  Patient recently had OV with IM on 11/4.  No weight was recorded, the patient was noted to have 2+ pitting edema bilateral lower extremities.  Patient was  instructed to increase torsemide to 80 mg daily for 2 days then go back to 60 mg twice a day.  Patient had recent admission August-October for diabetic foot wound and heart failure exacerbation.  At discharge patient's estimated dry weight 105 kg or ~230 pounds.  Patient's weight today 111 kg, suggesting 13 pound weight increase.  Patient has done well to follow-up with wound care, heart failure, and medicine following this admission. Has not followed up with Nephrology.  ED course: Patient found to be profoundly hypoxic with EMS enroute to ED with oxygen saturations in the 70s started on nonrebreather.  Placed on BiPAP on arrival to the ED.  Systolic blood pressure 527P 170s.  Chest x-ray showed pulmonary edema.  Patient was given 60 mg of Lasix IV and Nitropaste for BPs.  CBC and BMP stable.  Medicine called for admission.   Meds:  Current Outpatient Medications  Medication Instructions   acetaminophen (TYLENOL) 500 mg, Oral, Every 6 hours PRN   amLODipine (NORVASC) 10 mg, Oral, Daily   Aspirin Low Dose 81 mg, Oral, Daily   atorvastatin (LIPITOR) 40 mg, Oral, Daily   Blood Glucose Monitoring Suppl (ONETOUCH VERIO) w/Device KIT Check blood sugar three times daily. E11.40   calcium acetate (PHOSLO) 667 mg, Oral, 3 times daily with meals   carvedilol (COREG) 18.75 mg, Oral, 2 times daily with  meals   FeroSul 325 mg, Oral, Daily with breakfast   glucose blood test strip Use as instructedCheck blood sugar three times daily. E11.40   insulin lispro (HUMALOG KWIKPEN) 100 UNIT/ML KwikPen Inject 4 units before breakfast and dinner.   Insulin Pen Needle 32G X 4 MM MISC Use to inject insulin as directed.   Lantus SoloStar 30 Units, Subcutaneous, Daily at bedtime   risperiDONE (RISPERDAL) 3 MG tablet TAKE 1 TABLET (3 MG TOTAL) BY MOUTH AT BEDTIME.   torsemide (DEMADEX) 60 mg, Oral, Daily    Allergies: Allergies as of 10/24/2021 - Review Complete 10/24/2021  Allergen Reaction Noted   Elemental  sulfur Hives and Other (See Comments) 11/17/2013   Hydralazine hcl Other (See Comments) 11/08/2019   Hydrocodone Itching and Other (See Comments) 11/17/2013   Metformin and related Nausea And Vomiting and Other (See Comments) 09/21/2014   Other Nausea Only and Other (See Comments) 05/09/2021   Plaquenil [hydroxychloroquine sulfate] Hives 10/07/2018   Shellfish-derived products Nausea Only and Other (See Comments) 05/09/2021   Shrimp (diagnostic) Nausea Only and Other (See Comments) 04/24/2021   Sulfa antibiotics Hives 05/09/2021    Past Medical History:  Diagnosis Date   Anemia 2006   Depression 2014   previously on amitryptiline    Diabetes mellitus with neurological manifestation ([employer] [employer]) 2006   Diabetic peripheral neuropathy ([place]) 04/26/2020   Fracture of left ankle 1997    Gastroparesis 07/2016   HOH (hard of hearing) 2004    Hyperlipidemia 2006   Hypertension 2006   IBS (irritable bowel syndrome) 2002    Leukopenia 2015    Macular degeneration 11/2019   Shingles 2009    Stroke [facility]) 2020   Thyroid nodule 2004    Family History:  Family History  Problem Relation Age of Onset   Hypertension Mother    Heart disease Mother    Diabetes Mother    Thyroid disease Mother    Congestive Heart Failure Mother    Breast cancer Maternal Grandmother    Colon cancer Maternal Grandfather    Heart attack Sister    Heart disease Brother    Hyperlipidemia Brother    Hypertension Brother    Diabetes Father    Breast cancer Maternal Aunt     Social History:  Patient lives with her husband.  She does not work, spends most of her time in the recliner.  Does not ambulate much, uses WC to get to appointments.  Her husband is the primary caregiver, however he works second shift, and patient is home alone at this time.  Patient's husband assists with some ADLs.  Patient does transfer to bedside commode on her own.  Patient's husband assists with wound care and bringing her to appointments.   She has no additional family that is able to help provide care at home, brother and sister have their own medical concerns and cannot provide daily care.  Review of Systems: A complete ROS was negative except as per HPI.   Physical Exam: Blood pressure (!) 178/80, pulse 89, temperature 97.8 F (36.6 C), temperature source Tympanic, resp. rate (!) 25, SpO2 100 %. Physical Exam: General: Comfortable appearing African-American female on BiPAP, NAD HENT: normocephalic, atraumatic EYES: conjunctiva non-erythematous, no scleral icterus CV: regular rate, normal rhythm, no murmurs, rubs, gallops.  3+ pitting edema to the knee, with dependent pitting edema to the hip Pulmonary: normal work of breathing on Bipap, diminished breath sounds bilateral lung bases  Abdominal: non-distended, soft, non-tender to palpation, normal BS  Skin: Warm and dry, left heel diabetic foot ulcer see updated photo in media tab Neurological: MS: awake, alert and oriented x3, normal speech and fund of knowledge Motor: moves all extremities antigravity Psych: Flat affect  EKG: personally reviewed my interpretation is NSR  CXR: personally reviewed my interpretation is pulmonary congestion, fluffy infiltrates  CBC    Component Value Date/Time   WBC 12.6 (H) 10/24/2021 1728   RBC 3.25 (L) 10/24/2021 1728   HGB 9.9 (L) 10/24/2021 1750   HGB 8.8 (L) 06/30/2021 1226   HGB 12.5 07/08/2008 1551   HCT 29.0 (L) 10/24/2021 1750   HCT 28.3 (L) 06/30/2021 1226   HCT 37.3 07/08/2008 1551   PLT 221 10/24/2021 1728   PLT 304 06/30/2021 1226   MCV 92.3 10/24/2021 1728   MCV 89 06/30/2021 1226   MCV 84.7 07/08/2008 1551   MCH 28.0 10/24/2021 1728   MCHC 30.3 10/24/2021 1728   RDW 14.5 10/24/2021 1728   RDW 13.6 06/30/2021 1226   RDW 13.0 07/08/2008 1551   LYMPHSABS 1.4 10/24/2021 1728   LYMPHSABS 2.5 02/23/2021 1523   LYMPHSABS 4.6 (H) 07/08/2008 1551   MONOABS 1.0 10/24/2021 1728   MONOABS 0.6 07/08/2008 1551   EOSABS  0.7 (H) 10/24/2021 1728   EOSABS 0.5 (H) 02/23/2021 1523   BASOSABS 0.1 10/24/2021 1728   BASOSABS 0.1 02/23/2021 1523   BASOSABS 0.1 07/08/2008 1551   CMP     Component Value Date/Time   NA 135 10/24/2021 1750   NA 136 10/20/2021 1206   K 4.2 10/24/2021 1750   CL 99 10/24/2021 1728   CO2 23 10/24/2021 1728   GLUCOSE 182 (H) 10/24/2021 1728   BUN 33 (H) 10/24/2021 1728   BUN 36 (H) 10/20/2021 1206   CREATININE 3.12 (H) 10/24/2021 1728   CREATININE 0.73 08/17/2014 1641   CALCIUM 8.8 (L) 10/24/2021 1728   CALCIUM 8.3 (L) 09/07/2021 1129   PROT 6.9 10/06/2021 0500   PROT 6.6 06/30/2021 1226   ALBUMIN 2.7 (L) 10/06/2021 0500   ALBUMIN 3.5 (L) 06/30/2021 1226   AST 10 (L) 10/06/2021 0500   ALT 16 10/06/2021 0500   ALKPHOS 53 10/06/2021 0500   BILITOT 0.9 10/06/2021 0500   BILITOT 0.3 06/30/2021 1226   GFRNONAA 16 (L) 10/24/2021 1728   GFRAA 58 (L) 05/06/2020 1207   Troponin 8 VBG pH 7.48  Assessment & Plan by Problem: Active Problems:   Acute exacerbation of congestive heart failure [facility])  Nancy [name] is a 60 year old African-American female with past medical history of hypertension, hyperlipidemia, bilateral carotid artery stenosis, CVA 2020, HFpEF EF 55 to 60%, CKD stage V, TIA, type 2 diabetes mellitus with diabetic neuropathy, history of gangrenous left heel ulcer, history of tactile hallucinations, mild CAD, anemia of chronic disease, who presents to [facility] ED for shortness of breath and was found to have acute on chronic heart failure exacerbation.  #Acute hypoxic respiratory failure #Acute on chronic HFpEF exacerbation #NYHA class III at baseline, ACC/AHA stage C Patient has had 13 pound weight gain since last discharge, now endorsing 1 day history of SOB, several week history of orthopnea and lower extremity swelling.  Dry weight estimated to be ~230 pounds.  Last echo completed 6/22 showed EF 55 to 60%, moderate LVH, normal RV, dilated IVC.  Patient reports  adherence to medications and diet and fluid restrictions, however patient noted to have cognitive deficits following CVA in 2020, therefore we have concerns she may not be fully adherent  to therapies.  Patient noted to be volume up on heart failure clinic visit 10/28 and medicine OV on 11/4.  Plan at OV was to increase diuresis for 2 days.  Unclear if patient was able to follow these instructions.  In the ED patient noted to be hypoxemic, CXR with pulmonary edema, euvolemic on exam. Patient initially on BiPAP, now transitioned to O2 nasal cannula 4 L.  Patient likely has acute on chronic heart failure exacerbation causing acute hypoxic respiratory failure with new O2 requirement.  Also on the differential given relatively sudden SOB was pulmonary embolism.  Given patient is not tachycardic, Wells score 1.5 given immobility, PE less likely.  Patient has no signs or symptoms of infection.  Plan: -Total 80 mg IV Lasix -O2 supplementation as needed -BNP 507 -Strict I's and O's, daily weights -Carvedilol 18.75 mg twice daily -GDMT limited due to CKD stage IV, cannot take ACE or ARB, spironolactone, SGLT2i -PT eval and treat -Consult heart failure in the a.m. -Continue to follow-up with heart failure clinic at discharge  #Hypertension #HLD Elevated systolic blood pressures [id number] on arrival to the ED.  Patient started on Nitropaste while on BiPAP.  Now transitioned to nasal cannula.  We restarted patient on home antihypertensives.  Systolic blood pressure improved to 150s. Plan: -Continue home amlodipine 10 mg daily -Continue home carvedilol 18.75 mg twice daily -Continue home Lipitor 40 mg daily  #Insulin-dependent type 2 diabetes #Diabetic neuropathy and diabetic nephropathy #History diabetic gastroparesis Last hemoglobin A1c 08/2021 was 6.8%.  Patient takes Lantus 30 units at bedtime, lispro 4 units before breakfast and dinner. Plan: -Semglee 15 units nightly -SSI moderate -CBG before meals  and at bedtime  #CKD stage 4, GFR 15-29 ml/min likely 2/2 hypertensive nephropathy and diabetic nephropathy BUN, creatinine, GFR stable.  Electrolytes normal.  Kidney function has been slowly declining since last few admissions.  Likely cardiorenal syndrome contributing to worsening renal function.  Nephrology was consulted during last admission, there is no indication for HD at that time though there was concern patient would likely progressed to needing hemodialysis in the near future.  Patient declining hemodialysis during last admission.  Patient reportedly has followed up with Dr. Hollie Salk, nephrology, last seen in August. Plan: -Continue home PhosLo 3 times daily -Continue to monitor kidney function, AM renal function panel -Avoid nephrotoxic agents -Patient needs outpatient nephrology follow-up at discharge  #Gangrenous diabetic Foot Ulcer, heel of left foot #Diabetic Foot Ulcer, lateral L foot #PAD Patient was admitted August-October for gangrenous diabetic left foot ulcer.  Vascular and Ortho had recommended transtibial amputation at that time and patient declined.  Instead we pursued wound care and medical management with p.o. antibiotics end date 10/10.  Patient has followed with wound care, and husband has helped to provide wound care at home with daily dressing changes.  Patient has no signs or symptoms of infection at this time. 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 SLP performed cognitive evaluation during previous admission, patient scored 68 out of 30 indicating "dementia like" severity of cognitive impairment, with severe deficits in working memory, problem solving, sustained  attention.    #Chronic normocytic anemia secondary to anemia of CKD, ACD, history IDA Hemoglobin stable 8-9. Last iron panel 08/2021 with low  iron, low percent sat and normal ferritin. Plan:  -Iron supplement daily  #History of tactile hallucinations - Patient was continued on Risperidone 3 mg nightly  Diet: Renal/CM with fluid restriction 1200 mL VTE: Lovenox IVF: None Code: DNR  Dispo: Admit patient to Inpatient with expected length of stay greater than 2 midnights.  Portions of this report may have been transcribed using voice recognition software. Every effort was made to ensure accuracy; however, inadvertent computerized transcription errors may be present.   Signed:  Wayland Denis, MD 10/25/21,  1:31 AM Internal Medicine Resident, PGY-1 [name]  Pager: [phone] After 5pm on weekdays and 1pm on weekends: On Call pager: [phone]

## 2021-10-24 NOTE — ED Notes (Signed)
Placed a external cath patient is resting with call bell in reach 

## 2021-10-25 ENCOUNTER — Other Ambulatory Visit: Payer: Self-pay

## 2021-10-25 LAB — BRAIN NATRIURETIC PEPTIDE: B Natriuretic Peptide: 507.3 pg/mL — ABNORMAL HIGH (ref 0.0–100.0)

## 2021-10-25 LAB — CBC
HCT: 27.4 % — ABNORMAL LOW (ref 36.0–46.0)
Hemoglobin: 8.5 g/dL — ABNORMAL LOW (ref 12.0–15.0)
MCH: 28.2 pg (ref 26.0–34.0)
MCHC: 31 g/dL (ref 30.0–36.0)
MCV: 91 fL (ref 80.0–100.0)
Platelets: 219 10*3/uL (ref 150–400)
RBC: 3.01 MIL/uL — ABNORMAL LOW (ref 3.87–5.11)
RDW: 14.4 % (ref 11.5–15.5)
WBC: 11.9 10*3/uL — ABNORMAL HIGH (ref 4.0–10.5)
nRBC: 0 % (ref 0.0–0.2)

## 2021-10-25 LAB — RENAL FUNCTION PANEL
Albumin: 3 g/dL — ABNORMAL LOW (ref 3.5–5.0)
Anion gap: 9 (ref 5–15)
BUN: 32 mg/dL — ABNORMAL HIGH (ref 8–23)
CO2: 27 mmol/L (ref 22–32)
Calcium: 8.8 mg/dL — ABNORMAL LOW (ref 8.9–10.3)
Chloride: 98 mmol/L (ref 98–111)
Creatinine, Ser: 3.06 mg/dL — ABNORMAL HIGH (ref 0.44–1.00)
GFR, Estimated: 17 mL/min — ABNORMAL LOW (ref >60)
Glucose, Bld: 159 mg/dL — ABNORMAL HIGH (ref 70–99)
Phosphorus: 4.8 mg/dL — ABNORMAL HIGH (ref 2.5–4.6)
Potassium: 4.1 mmol/L (ref 3.5–5.1)
Sodium: 134 mmol/L — ABNORMAL LOW (ref 135–145)

## 2021-10-25 LAB — GLUCOSE, CAPILLARY
Glucose-Capillary: 142 mg/dL — ABNORMAL HIGH (ref 70–99)
Glucose-Capillary: 148 mg/dL — ABNORMAL HIGH (ref 70–99)
Glucose-Capillary: 159 mg/dL — ABNORMAL HIGH (ref 70–99)
Glucose-Capillary: 233 mg/dL — ABNORMAL HIGH (ref 70–99)

## 2021-10-25 LAB — D-DIMER, QUANTITATIVE: D-Dimer, Quant: 1.99 ug/mL-FEU — ABNORMAL HIGH (ref 0.00–0.50)

## 2021-10-25 MED ORDER — ACETAMINOPHEN 650 MG RE SUPP: 650.0000 mg | Freq: Four times a day (QID) | RECTAL | Status: DC | PRN

## 2021-10-25 MED ORDER — ACETAMINOPHEN 500 MG PO TABS
1000.0000 mg | ORAL_TABLET | Freq: Three times a day (TID) | ORAL | Status: DC
  Administered 2021-10-25 – 2021-10-26 (×4): 1000 mg via ORAL
  Filled 2021-10-25 (×4): qty 2

## 2021-10-25 MED ORDER — FUROSEMIDE 10 MG/ML IJ SOLN
60.0000 mg | Freq: Every day | INTRAMUSCULAR | Status: DC
  Administered 2021-10-25 – 2021-10-31 (×7): 60 mg via INTRAVENOUS
  Filled 2021-10-25 (×7): qty 6

## 2021-10-25 MED ORDER — CARVEDILOL 6.25 MG PO TABS: 18.7500 mg | ORAL_TABLET | Freq: Once | ORAL | Status: DC

## 2021-10-25 MED ORDER — ACETAMINOPHEN 650 MG RE SUPP: 650.0000 mg | Freq: Three times a day (TID) | RECTAL | Status: DC

## 2021-10-25 MED ORDER — CARVEDILOL 6.25 MG PO TABS
18.7500 mg | ORAL_TABLET | Freq: Two times a day (BID) | ORAL | Status: DC
  Administered 2021-10-25 – 2021-11-01 (×15): 18.75 mg via ORAL
  Filled 2021-10-25 (×15): qty 3

## 2021-10-25 MED ORDER — ACETAMINOPHEN 325 MG PO TABS: 650.0000 mg | ORAL_TABLET | Freq: Four times a day (QID) | ORAL | Status: DC | PRN

## 2021-10-25 NOTE — Evaluation (Signed)
Physical Therapy Evaluation Patient Details Name: Nancy [name] MRN: [id number] DOB: 09/02/1961 Today's Date: 10/25/2021  History of Present Illness  Pt is a 60 y.o. female who presented 10/24/21 with SOB and CHF exacerbation. PMH: hypertension, hyperlipidemia, bilateral carotid artery stenosis, CVA 2020, HFpEF EF 55 to 60%, CKD stage V, TIA, type 2 diabetes mellitus with diabetic neuropathy, history of gangrenous left heel ulcer, history of tactile hallucinations, mild CAD, anemia of chronic disease   Clinical Impression  Pt presents with condition above and deficits mentioned below, see PT Problem List. PTA, she has been living in a hotel room with her husband, who works full-time. Pt reports she has not walked more than taking steps to perform transfers mod I with a SPC for ~6 months. However, chart revealed pt ambulated up to ~130 ft with a RW and supervision with PT during prior admission 10/10/21. Currently, pt displays deficits in lower extremity sensation, coordination, strength, and skin integrity impacting her balance and resulting in her needing up to minA for transfers and to take a few steps to transfer bed > recliner with SPC this date. In addition, pt with low activity tolerance, displaying desat to 83% on RA with bed mobility. Pt requiring up to 2L to maintain sats >/= 94% at this time. Recommending pt continue with HHPT upon d/c. Will continue to follow acutely.     Recommendations for follow up therapy are one component of a multi-disciplinary discharge planning process, led by the attending physician.  Recommendations may be updated based on patient status, additional functional criteria and insurance authorization.  Follow Up Recommendations Home health PT    Assistance Recommended at Discharge Intermittent Supervision/Assistance  Functional Status Assessment Patient has had a recent decline in their functional status and demonstrates the ability to make significant  improvements in function in a reasonable and predictable amount of time.  Equipment Recommendations  None recommended by PT    Recommendations for Other Services       Precautions / Restrictions Precautions Precautions: Fall;Other (comment) Precaution Comments: bil feet ulcers Required Braces or Orthoses: Other Brace Other Brace: L foot PRAFO with ambulation Restrictions Weight Bearing Restrictions: No LLE Weight Bearing: Weight bearing as tolerated Other Position/Activity Restrictions: heel offloading with PRAFO      Mobility  Bed Mobility Overal bed mobility: Needs Assistance Bed Mobility: Supine to Sit     Supine to sit: Min assist;HOB elevated     General bed mobility comments: Cues provided to use bed rail to pull trunk to sit up HOB elevated, minA to complete scooting hips to EOB.    Transfers Overall transfer level: Needs assistance Equipment used: Straight cane Transfers: Sit to/from Stand;Bed to chair/wheelchair/BSC Sit to Stand: Min assist   Step pivot transfers: Min assist       General transfer comment: MinA to power up and steady from EOB with SPC. MinA to steady and step to L bed > recliner.    Ambulation/Gait               General Gait Details: Pt only taking several steps to transfer, recent baseline.  Stairs            Wheelchair Mobility    Modified Rankin (Stroke Patients Only)       Balance Overall balance assessment: Needs assistance Sitting-balance support: No upper extremity supported;Feet supported Sitting balance-Leahy Scale: Fair Sitting balance - Comments: Static sitting EOB with supervision for safety.   Standing balance support: Single extremity supported Standing balance-Leahy  Scale: Poor Standing balance comment: Reliant on SPC for stability and up to minA.                             Pertinent Vitals/Pain Pain Assessment: 0-10 Pain Score: 8  Pain Location: headache, back Pain Descriptors /  Indicators: Headache Pain Intervention(s): Limited activity within patient's tolerance;Monitored during session;Repositioned    Home Living Family/patient expects to be discharged to:: Other (Comment) (hotel) Living Arrangements: Spouse/significant other Available Help at Discharge: Family;Available PRN/intermittently (husband works full time Pharmacologist) Type of Home: Other(Comment) (hotel) Home Access: Level entry       Home Layout: One [place]: Conservation officer, nature (2 wheels);BSC/3in1;Shower seat;Cane - single point;Wheelchair - manual Additional Comments: sleeps in recliner; does not use home O2    Prior Function Prior Level of Function : Needs assist       Physical Assist : Mobility (physical);ADLs (physical) Mobility (physical): Bed mobility;Gait;Stairs ADLs (physical): Bathing;Dressing;Grooming;IADLs Mobility Comments: Uses SPC majority of time. Has not walked more than a couple feet at a time for transfers for ~6 months (since ulcers/wounds occurred/worsened). Mod I using SPC for stand step transfers. Denies any recent falls. However, chart revealed pt ambulating up to ~130 ft with a RW and supervision with PT during prior admission 10/10/21 ADLs Comments: Does not drive.     Hand Dominance   Dominant Hand: Right    Extremity/Trunk Assessment   Upper Extremity Assessment Upper Extremity Assessment: Defer to OT evaluation    Lower Extremity Assessment Lower Extremity Assessment: Generalized weakness (wounds bil feet with pt reporting no sensation in bil feet)    Cervical / Trunk Assessment Cervical / Trunk Assessment: Normal  Communication   Communication: No difficulties  Cognition Arousal/Alertness: Awake/alert Behavior During Therapy: Flat affect Overall Cognitive Status: History of cognitive impairments - at baseline                                 General Comments: Pt with flat affect and slow to process, needing cues or  instructions/questions repeated often. Per chart, pt with hx of cognitive deficits.        General Comments General comments (skin integrity, edema, etc.): Pt on 4L O2 via [place] upon arrival, SpO2 >/= 96%; reduced to RA with SpO2 decreasing to as low as 83%; maintained SpO2 >/= 94% on 2L, remained at 2L end of session    Exercises     Assessment/Plan    PT Assessment Patient needs continued PT services  PT Problem List Decreased strength;Decreased activity tolerance;Decreased balance;Decreased mobility;Decreased coordination;Decreased cognition;Cardiopulmonary status limiting activity;Impaired sensation;Decreased skin integrity       PT Treatment Interventions Gait training;DME instruction;Functional mobility training;Therapeutic exercise;Therapeutic activities;Balance training;Neuromuscular re-education;Cognitive remediation;Patient/family education;Wheelchair mobility training    PT Goals (Current goals can be found in the Care Plan section)  Acute Rehab PT Goals Patient Stated Goal: to get back to getting HHPT PT Goal Formulation: With patient Time For Goal Achievement: 11/08/21 Potential to Achieve Goals: Fair    Frequency Min 3X/week   Barriers to discharge        Co-evaluation               AM-PAC PT "6 Clicks" Mobility  Outcome Measure Help needed turning from your back to your side while in a flat bed without using bedrails?: A Little Help needed moving from lying on your back to  sitting on the side of a flat bed without using bedrails?: A Little Help needed moving to and from a bed to a chair (including a wheelchair)?: A Little Help needed standing up from a chair using your arms (e.g., wheelchair or bedside chair)?: A Little Help needed to walk in hospital room?: Total Help needed climbing 3-5 steps with a railing? : Total 6 Click Score: 14    End of Session Equipment Utilized During Treatment: Oxygen Activity Tolerance: Patient limited by fatigue Patient  left: in chair;with call bell/phone within reach;with chair alarm set Nurse Communication: Mobility status PT Visit Diagnosis: Unsteadiness on feet (R26.81);Other abnormalities of gait and mobility (R26.89);Muscle weakness (generalized) (M62.81);Difficulty in walking, not elsewhere classified (R26.2)    Time: 6754-4920 PT Time Calculation (min) (ACUTE ONLY): 25 min   Charges:   PT Evaluation $PT Eval Moderate Complexity: 1 Mod PT Treatments $Therapeutic Activity: 8-22 mins        Moishe Spice, PT, DPT Acute Rehabilitation Services  Pager: [phone] Office: [phone]   Maretta Bees Pettis 10/25/2021, 11:00 AM

## 2021-10-25 NOTE — Progress Notes (Signed)
Heart Failure Navigator Progress Note  Assessed for Heart & Vascular TOC clinic readiness.  Patient does not meet criteria due to established pt with Dr. Aundra Dubin and AHF clinic.   Navigator available for reassessment of patient.   Pricilla Holm, MSN, RN Heart Failure Nurse Navigator [phone]

## 2021-10-25 NOTE — Consult Note (Signed)
[facility] Nurse Consult Note: Reason for Consult:Patient with critical limb ischemia bilaterally, seen recently by both VVS (Dr. Trula Slade, 9/21) and Orthopedics (Dr. Sharol Given, 9/22) who both recommend amputation.  Patient has refused.   ABIs; right 0.45/left 0.46 indicative of PAD; pain with ambulation  Wound type: Arterial insufficiency Pressure Injury POA: N/A Wound Bed: Left heel pad and left lateral foot with gangrenous ulcerations Left heel: 95% gangrenous/ 10% pink, pal Left lateral foot: 100% yellow soft non viable tissue  last seen 10/19/21 in the [facility] Measurement:Left heel: 5cm x 5.5cm x 1.4cm  Left lateral foot: 2cm x 3.2cm x 0.4cm  Unclear the depth of these wounds in the presence of necrotic tissue; last MRI left foot did not indicate osteomyelitis Drainage: nursing flowsheets do not indicate drainage In the presence of PAD I would recommend conservative/palliative wound care.  Opening these wounds would certainly increase the risk of patient infection.  Paint heel and lateral foot wound with betadine daily, top with DRY dressings. Secure with kerlix. Change daily  Off load heel with Prevalon boot  [facility]  Consider re-evaluation by orthopedics/vascular if patient will consent to amputation? Wounds are certainly not improving with any type of topical care due to her arterial disease.   Discussed POC with bedside nurse.  Re consult if needed, will not follow at this time. Thanks  Albertus Chiarelli [employer], RN,CWOCN, CNS, [place] [phone])

## 2021-10-25 NOTE — Progress Notes (Signed)
   10/25/21 1100  Clinical Encounter Type  Visited With Patient  Visit Type Initial  Referral From Nurse  Consult/Referral To Chaplain   Chaplain Jorene Guest responded to the consult request for an Advance Directive. Chaplain provided A.D. education. The patient said she wants her husband, [name], to be her HCPOA. The chaplain advised the patient since she is legally married, her husband, [name], will be designated by law to speak on her behalf if she becomes incapacitated. Chaplain Owens Shark advised HCPOA document can be done if she chooses someone other than her husband by using the A.D. This note was prepared by Jeanine Luz, M.Div. For questions please contact by phone [phone].

## 2021-10-25 NOTE — Progress Notes (Signed)
Mobility Specialist Progress Note:   10/25/21 1602  Mobility  Activity Ambulated in room  Level of Assistance Standby assist, set-up cues, supervision of patient - no hands on  Assistive Device Front wheel walker  Distance Ambulated (ft) 26 ft  Mobility Ambulated with assistance in room  Mobility Response Tolerated fair  Mobility performed by Mobility specialist  $Mobility charge 1 Mobility   Pt received in chair willing to participate in mobility. No complaints of pain. Pt returned to bed with call bell in reach and all needs met.   [facility] Health and safety inspector Phone [phone]

## 2021-10-25 NOTE — Progress Notes (Signed)
Pt reports staying in her recliner during the day with BSC nearby and set up for meal during the day. Her husband assists her in the shower and for LB ADL, but does not desire to become more independent. Will follow acutely to address UB ADL and BSC transfers.    10/25/21 1200  OT Visit Information  Last OT Received On 10/25/21  Assistance Needed +1  History of Present Illness Pt is a 60 y.o. female who presented 10/24/21 with SOB and CHF exacerbation. PMH: hypertension, hyperlipidemia, bilateral carotid artery stenosis, CVA 2020, HFpEF EF 55 to 60%, CKD stage V, TIA, type 2 diabetes mellitus with diabetic neuropathy, history of gangrenous left heel ulcer, history of tactile hallucinations, mild CAD, anemia of chronic disease  Precautions  Precautions Fall;Other (comment)  Precaution Comments bil feet ulcers  Required Braces or Orthoses Other Brace  Other Brace L foot PRAFO with ambulation  Restrictions  Weight Bearing Restrictions Yes  Other Position/Activity Restrictions heel offloading with PRAFO  Home Living  Family/patient expects to be discharged to: Other (Comment) (motel)  Living Arrangements Spouse/significant other  Available Help at Discharge Family;Available PRN/intermittently (husband works)  Type of Home Other(Comment)  Home Access Level entry (motel)  Home Layout One level  Bathroom Shower/Tub Walk-in shower  [place] (2 wheels);BSC/3in1;Shower seat;Cane - single point;Wheelchair - manual  Additional Comments husband sets her up with BSC, food and phone while he is at work  Prior Function  Prior Level of Function  Needs assist  Mobility Comments reports walking to the bathroom to shower with husband's assist, otherwise transfers only from [facility] to w/c and recliner  ADLs Comments assisted for showering and LB dressing, all IADL, does not drive  Communication  Communication No difficulties  Pain Assessment  Pain  Assessment 0-10  Pain Location headache, back  Pain Descriptors / Indicators Headache  Pain Intervention(s) Monitored during session  Cognition  Arousal/Alertness Awake/alert  Behavior During Therapy Flat affect  Overall Cognitive Status History of cognitive impairments - at baseline  General Comments pt without goals of becoming more mobile or independent when questioned  Upper Extremity Assessment  Upper Extremity Assessment Generalized weakness  Lower Extremity Assessment  Lower Extremity Assessment Defer to PT evaluation  Cervical / Trunk Assessment  Cervical / Trunk Assessment Other exceptions  Cervical / Trunk Exceptions obesity  Vision- History  Ability to See in Adequate Light 0 Adequate  Patient Visual Report No change from baseline  ADL  Overall ADL's  Needs assistance/impaired  Eating/Feeding Set up;Sitting  Grooming Wash/dry hands;Wash/dry face;Sitting;Set up  Upper Body Bathing Sitting;Minimal assistance  Upper Body Bathing Details (indicate cue type and reason) assist for back  Lower Body Bathing Total assistance;Sit to/from stand  Upper Body Dressing  Set up;Sitting  Lower Body Dressing Total assistance;Sit to/from Engineer, maintenance (IT) Details (indicate cue type and reason) hand held  [place] and Hygiene Minimal assistance;Sit to/from stand  Bed Mobility  General bed mobility comments pt received and returned to chair  Transfers  Overall transfer level Needs assistance  Equipment used 1 person hand held assist  Transfers Sit to/from Stand  Sit to [place] assist  Bed to/from chair/wheelchair/BSC transfer type: Stand pivot  Stand pivot transfers Min assist  General transfer comment min assist to rise, hand held assist to pivot  Balance  Overall balance assessment Needs assistance  Sitting balance-Leahy Scale Fair  Standing balance-Leahy Scale Poor  Standing balance comment  reliant on one hand stability to transfer  OT - End of Session  Equipment Utilized During Treatment Gait belt  Activity Tolerance Patient tolerated treatment well  Patient left in chair;with call bell/phone within reach;with chair alarm set  OT Assessment  OT Recommendation/Assessment Patient needs continued OT Services  OT Visit Diagnosis Unsteadiness on feet (R26.81)  OT Problem List Decreased strength;Impaired balance (sitting and/or standing);Decreased activity tolerance;Decreased cognition;Cardiopulmonary status limiting activity;Increased edema  OT Plan  OT Frequency (ACUTE ONLY) Min 2X/week  OT Treatment/Interventions (ACUTE ONLY) Self-care/ADL training;DME and/or AE instruction;Patient/family education;Balance training;Therapeutic activities  AM-PAC OT "6 Clicks" Daily Activity Outcome Measure (Version 2)  Help from another person eating meals? 3  Help from another person taking care of personal grooming? 3  Help from another person toileting, which includes using toliet, bedpan, or urinal? 3  Help from another person bathing (including washing, rinsing, drying)? 2  Help from another person to put on and taking off regular upper body clothing? 3  Help from another person to put on and taking off regular lower body clothing? 2  6 Click Score 16  Progressive Mobility  What is the highest level of mobility based on the progressive mobility assessment? Level 3 (Stands with assist) - Balance while standing  and cannot march in place  Mobility Out of bed to chair with meals  OT Recommendation  Follow Up Recommendations No OT follow up  Assistance recommended at discharge Intermittent Supervision/Assistance  Functional Status Assessent Patient has had a recent decline in their functional status and/or demonstrates limited ability to make significant improvements in function in a reasonable and predictable amount of time  OT Equipment None recommended by OT  Individuals Consulted   Consulted and Agree with Results and Recommendations Patient  Acute Rehab OT Goals  Patient Stated Goal pt's only goal is to get on and off BSC by herself  OT Goal Formulation With patient  Time For Goal Achievement 11/08/21  Potential to Achieve Goals Good  OT Time Calculation  OT Start Time (ACUTE ONLY) 1150  OT Stop Time (ACUTE ONLY) 1211  OT Time Calculation (min) 21 min  OT General Charges  $OT Visit 1 Visit  OT Evaluation  $OT Eval Moderate Complexity 1 Mod  Written Expression  Dominant Hand Right  Nestor Lewandowsky, OTR/L Acute Rehabilitation Services Pager: [phone] Office: [phone]

## 2021-10-25 NOTE — Progress Notes (Signed)
Subjective: I seen and evaluated [name] at bedside.  She was sitting in the chair next to the bed.  She just completed working with physical therapy. She states with standing she has some lightheadedness and dizziness. She states that her breathing has improved.  She is on 2 L of nasal cannula at this time. She states that she still feels very swollen compared to her baseline.  She denies chest pain, shortness of breath, or lower extremity pains.  Patient states that she has a headache, describes it as a throbbing 9/10 pain, localized to the frontal lobe. She states that it began this morning.  She denies any changes in vision, nausea or vomiting. She denies any syncope or recent falls.  She denies any history of previous headaches.  Objective:  Vital signs in last 24 hours: Vitals:   10/25/21 0650 10/25/21 0721 10/25/21 1111 10/25/21 1115  BP:  (!) 165/73 (!) 174/84   Pulse: 85 86 86   Resp: 20 19 20    Temp: (!) 97.5 F (36.4 C) (!) 97.5 F (36.4 C)  98.8 F (37.1 C)  TempSrc: Oral Oral  Oral  SpO2: 100% 100% 100%   Weight:      Height:       Physical Exam Constitutional:      General: She is not in acute distress.    Interventions: Nasal cannula in place.  HENT:     Head: Normocephalic and atraumatic.  Eyes:     General: Lids are normal.  Cardiovascular:     Rate and Rhythm: Normal rate.     Heart sounds: Normal heart sounds.  Pulmonary:     Effort: Pulmonary effort is normal.     Breath sounds: Normal air entry. Decreased breath sounds and rhonchi present.  Chest:     Comments: Nitropaste patch located on left upper chest Abdominal:     General: Abdomen is protuberant.  Genitourinary:    Comments: Purewick present Musculoskeletal:     Right lower leg: 2+ Edema present.     Left lower leg: 2+ Edema present.  Feet:     Right foot:     Skin integrity: Callus and dry skin present.     Toenail Condition: Right toenails are abnormally thick.     Comments:  Bilateral feet wrapped in bandage. The left foot was neatly wrapped and Prevalon boot present. The right foot- dry callus, no open wounds, Skin:    General: Skin is warm and dry.  Neurological:     General: No focal deficit present.     Mental Status: She is alert.  Psychiatric:        Attention and Perception: Attention normal.        Behavior: Behavior normal. Behavior is cooperative.     Assessment/Plan:  Principal Problem:   Acute exacerbation of congestive heart failure (HCC) Active Problems:   Uncontrolled hypertension   Type 2 diabetes mellitus with stage 4 chronic kidney disease, with long-term current use of insulin (HCC)   CKD (chronic kidney disease), stage IV (HCC)   Diabetic ulcer of left foot (HCC)   Gangrene of left foot (HCC)  Acute exacerbation of congestive heart failure Acute hypoxic respiratory failure Patient reports improvement in her breathing. Currently on 2 L of nasal cannula.  On exam patient appears volume overloaded with 2+ nonpitting edema of the lower extremities.  Rhonchi noted throughout bilateral lung fields on auscultation. --IV Lasix 60 mg daily --[place] O2 PRN -- Carvedilol 18.75 mg  twice daily  Hypertension Hyperlipidemia Patient has been hypertensive since admission.  Currently her blood pressure systolic >144 and diastolic >81.  Due to her chronic kidney disease; goal-directed management is limited. --Amlodipine 10 mg daily --Carvedilol 18.75 twice daily --Lipitor 40 mg daily  Insulin-dependent type 2 diabetes Recent A1c 16.8% (September 2022); CBGs in the 140s-150s. --Semglee 15 units --SSI moderate  CKD stage IV GFR <20 -- Avoid nephrotoxic medications --PhosLo 3 times daily  Gangrenous diabetic ulcer of the left foot PAD Arteriogram of the lower extremities deferred due to CKD and risk of kidney damage with the use of contrast --Wound care following --Prevalon boot on left foot --ASA 81mg  daily  Anemia of chronic  disease Baseline hemoglobin ~9.  Last iron panel September 2022 revealed low iron, low percent sat and normal ferritin --Iron supplement daily  Tactile hallucination --Continue home medication risperidone 3 mg nightly  Headache Patient complained of 8/10 throbbing headache in the frontal lobe.  Denies changes in vision, nausea or vomiting.  -- Increase Tylenol from 650 mg to 1000 mg 3 times daily   Prior to Admission Living Arrangement: Anticipated Discharge Location: Barriers to Discharge: Dispo: Anticipated discharge in approximately 1-2 day(s).   Timothy Lasso, MD 10/25/2021, 12:33 PM Pager: [phone] After 5pm on weekdays and 1pm on weekends: On Call pager [phone]

## 2021-10-26 LAB — COMPREHENSIVE METABOLIC PANEL
ALT: 9 U/L (ref 0–44)
AST: 9 U/L — ABNORMAL LOW (ref 15–41)
Albumin: 2.8 g/dL — ABNORMAL LOW (ref 3.5–5.0)
Alkaline Phosphatase: 46 U/L (ref 38–126)
Anion gap: 8 (ref 5–15)
BUN: 38 mg/dL — ABNORMAL HIGH (ref 8–23)
CO2: 27 mmol/L (ref 22–32)
Calcium: 8.8 mg/dL — ABNORMAL LOW (ref 8.9–10.3)
Chloride: 99 mmol/L (ref 98–111)
Creatinine, Ser: 3.08 mg/dL — ABNORMAL HIGH (ref 0.44–1.00)
GFR, Estimated: 17 mL/min — ABNORMAL LOW (ref >60)
Glucose, Bld: 104 mg/dL — ABNORMAL HIGH (ref 70–99)
Potassium: 4.1 mmol/L (ref 3.5–5.1)
Sodium: 134 mmol/L — ABNORMAL LOW (ref 135–145)
Total Bilirubin: 1 mg/dL (ref 0.3–1.2)
Total Protein: 6.7 g/dL (ref 6.5–8.1)

## 2021-10-26 LAB — CBC
HCT: 25.5 % — ABNORMAL LOW (ref 36.0–46.0)
Hemoglobin: 7.9 g/dL — ABNORMAL LOW (ref 12.0–15.0)
MCH: 28.5 pg (ref 26.0–34.0)
MCHC: 31 g/dL (ref 30.0–36.0)
MCV: 92.1 fL (ref 80.0–100.0)
Platelets: 210 10*3/uL (ref 150–400)
RBC: 2.77 MIL/uL — ABNORMAL LOW (ref 3.87–5.11)
RDW: 14.4 % (ref 11.5–15.5)
WBC: 7.2 10*3/uL (ref 4.0–10.5)
nRBC: 0 % (ref 0.0–0.2)

## 2021-10-26 LAB — PHOSPHORUS: Phosphorus: 5.5 mg/dL — ABNORMAL HIGH (ref 2.5–4.6)

## 2021-10-26 LAB — GLUCOSE, CAPILLARY
Glucose-Capillary: 110 mg/dL — ABNORMAL HIGH (ref 70–99)
Glucose-Capillary: 127 mg/dL — ABNORMAL HIGH (ref 70–99)
Glucose-Capillary: 171 mg/dL — ABNORMAL HIGH (ref 70–99)
Glucose-Capillary: 143 mg/dL — ABNORMAL HIGH (ref 70–99)

## 2021-10-26 LAB — MAGNESIUM: Magnesium: 2.2 mg/dL (ref 1.7–2.4)

## 2021-10-26 MED ORDER — FUROSEMIDE 10 MG/ML IJ SOLN
20.0000 mg | Freq: Once | INTRAMUSCULAR | Status: AC
  Administered 2021-10-26: 20 mg via INTRAVENOUS
  Filled 2021-10-26: qty 2

## 2021-10-26 MED ORDER — ACETAMINOPHEN 500 MG PO TABS
1000.0000 mg | ORAL_TABLET | Freq: Three times a day (TID) | ORAL | Status: DC | PRN
  Administered 2021-10-26 – 2021-11-01 (×5): 1000 mg via ORAL
  Filled 2021-10-26 (×5): qty 2

## 2021-10-26 MED ORDER — ACETAMINOPHEN 650 MG RE SUPP: 650.0000 mg | Freq: Three times a day (TID) | RECTAL | Status: DC | PRN

## 2021-10-26 NOTE — Progress Notes (Signed)
Performed bedside debridement of patient's heel. Risk and benefits were explained to the patient. No topical anesthesia was used for the procedure as the patient as significant lower extremity neuropathy. There was minimal blood loss and the patient tolerated it well without complications. On examination of the wound there is considerable hypergranulation on the perimeter of the wound circumferentially. There is a 1 cm sinus tract at the 9:00 and 4:00 position, peripherally . There is a 2 cm sinus tract at the 3:00 position, peripherally. I was able to appreciate bone superficially at the 3:00 position, at half the radius to the wound peripheral margin. Please see pre and post-procedure pictures below.  Lawerance Cruel, D.O.  Internal Medicine Resident, PGY-3 [name]  Pager: [phone] 4:00 PM, 10/26/2021   **Please contact the on call pager after 5 pm and on weekends at [phone].**    Preprocedure:    Postprocedure:

## 2021-10-26 NOTE — Progress Notes (Signed)
  Progress Note   Date: 10/26/2021  Patient Name: Nancy [name]        MRN#: [id number]  Clarification of diagnosis: CKD stage 4

## 2021-10-26 NOTE — Progress Notes (Signed)
Pt refused Bi-PAP for night. Vitals are stable and pt not in any distress. Will continue to monitor as needed.

## 2021-10-26 NOTE — Progress Notes (Addendum)
Subjective: I have seen and evaluated Nancy [name] at bedside.  She was lying comfortably in bed.  The bed was at a 45 degree angle.  Nancy [name] states that her breathing is improving.  She is currently on nasal cannula O2 2L.  She denies any pain or discomfort at this time.  She states that the swelling in her lower extremities appear the same as previous day.  She denies any pain of the lower extremities.  She states overall she feels that she is doing better but not quite at her baseline.  She states she has not ambulated for the day, but will ask for nurse assistance to move from bed to chair.  Objective:  Vital signs in last 24 hours: Vitals:   10/25/21 1115 10/25/21 1952 10/26/21 0058 10/26/21 0403  BP:  125/69 (!) 175/78 139/86  Pulse:  74 62 68  Resp:  20 20 20   Temp: 98.8 F (37.1 C) (!) 97.3 F (36.3 C) 97.9 F (36.6 C) (!) 97.5 F (36.4 C)  TempSrc: Oral  Oral Oral  SpO2:  98% 93% 100%  Weight:    108.8 kg  Height:       Physical Exam Constitutional:      General: She is awake.     Interventions: Nasal cannula in place.  HENT:     Head: Normocephalic and atraumatic.  Eyes:     General: Lids are normal.  Cardiovascular:     Rate and Rhythm: Normal rate and regular rhythm.     Heart sounds: Normal heart sounds.  Pulmonary:     Effort: Pulmonary effort is normal.     Breath sounds: Normal breath sounds.  Musculoskeletal:     Right lower leg: 2+ Edema present.     Left lower leg: 2+ Edema present.       Feet:  Feet:     Comments: Left plantar surface of heel- necrotic, malodorous diabetic wound present. Dead tissue lying within superior surface of wound.  Right planar heel- dry callus present. NO pressure wound or ulcers present. Skin:    General: Skin is warm and dry.  Neurological:     General: No focal deficit present.     Mental Status: She is alert.  Psychiatric:        Behavior: Behavior normal. Behavior is cooperative.      Assessment/Plan:  Principal Problem:   Acute exacerbation of congestive heart failure (HCC) Active Problems:   Uncontrolled hypertension   Type 2 diabetes mellitus with stage 4 chronic kidney disease, with long-term current use of insulin (HCC)   CKD (chronic kidney disease), stage IV (HCC)   Diabetic ulcer of left foot (HCC)   Gangrene of left foot (HCC)   Acute exacerbation of congestive heart failure Acute hypoxic respiratory failure Patient appeared volume overloaded on exam. Patient still presents with 2+ edema of the bilateral lower extremities.  Rhonchi heard throughout bilateral lung fields on auscultation.  Although, patient reports improvement in her breathing and overall condition.  Patient states she is not quite at baseline. --Additional IV Lasix 20 mg given today. -- Continue IV Lasix 60 mg daily --[place] O2 PRN -- Carvedilol 18.75 mg twice daily   Hypertension Hyperlipidemia --Amlodipine 10 mg daily --Carvedilol 18.75 twice daily --Lipitor 40 mg daily   Insulin-dependent type 2 diabetes Recent A1c 16.8% (September 2022); --Semglee 15 units --SSI moderate   CKD stage IV GFR <20 -- Avoid nephrotoxic medications --PhosLo 3 times daily   Gangrenous diabetic  ulcer of the left foot PAD --Bedside debridement today --Wound care following --Prevalon boot on left foot --ASA 81mg  daily   Anemia of chronic disease Baseline hemoglobin ~9.  Last iron panel September 2022 revealed low iron, low percent sat and normal ferritin --Iron supplement daily   Tactile hallucination --Continue home medication risperidone 3 mg nightly   Headache, resolved Patient denies any symptoms at this time. --Tylenol 1000 mg TID PRN      Prior to Admission Living Arrangement: Anticipated Discharge Location: Barriers to Discharge: Dispo: Anticipated discharge in approximately 1-2 day(s).   Timothy Lasso, MD 10/26/2021, 7:27 AM Pager: [phone] After 5pm on weekdays  and 1pm on weekends: On Call pager [phone]

## 2021-10-26 NOTE — TOC Initial Note (Signed)
Transition of Care [facility]) - Initial/Assessment Note    Patient Details  Name: Nancy [name] MRN: [id number] Date of Birth: 18-May-1961  [facility] CM/SW Contact:    Verdell Carmine, RN Phone Number: 10/26/2021, 12:54 PM  Clinical Narrative:                 Patient called to discuss discharge planning,PT getting her up in chair at this time. Was just DC 2 weeks ago and is active with [facility] Levada Dy form [employer] called to make sure she is still active with them. According to Levada Dy the patient lives in a [employer], she has refused HH several times, however picks and chooses when she wants to be seen. They will continue to assist her post discharge. She has all DME needed.. Called patient back no answer at present. Plan to DC back to living situation with continued Home Health.          Patient Goals and CMS Choice        Expected Discharge Plan and [place]( hotel) with [facility]                                          Prior Living Arrangements/Services     Patient language and need for interpreter reviewed:: Yes        Need for Family Participation in Patient Care: Yes (Comment) Care giver support system in place?: Yes (comment)   Criminal Activity/Legal Involvement Pertinent to Current Situation/Hospitalization: No - Comment as needed  Activities of Daily Living Home Assistive Devices/Equipment: Cane (specify quad or straight), Walker (specify type), Eyeglasses, Brace (specify type), Shower chair with back, Wheelchair, Bedside commode/3-in-1 ADL Screening (condition at time of admission) Patient's cognitive ability adequate to safely complete daily activities?: No Is the patient deaf or have difficulty hearing?: Yes Does the patient have difficulty seeing, even when wearing glasses/contacts?: No Does the patient have difficulty concentrating, remembering, or making decisions?: No Patient able to express need for assistance  with ADLs?: Yes Does the patient have difficulty dressing or bathing?: Yes Independently performs ADLs?: No Communication: Needs assistance Is this a change from baseline?: Pre-admission baseline Grooming: Needs assistance Is this a change from baseline?: Pre-admission baseline Feeding: Independent Bathing: Needs assistance Is this a change from baseline?: Pre-admission baseline Toileting: Needs assistance Is this a change from baseline?: Pre-admission baseline In/Out Bed: Needs assistance Is this a change from baseline?: Pre-admission baseline Walks in Home: Needs assistance Is this a change from baseline?: Pre-admission baseline Does the patient have difficulty walking or climbing stairs?: Yes Weakness of Legs: None Weakness of Arms/Hands: None  Permission Sought/Granted                  Emotional Assessment       Orientation: : Oriented to Self, Oriented to Place, Oriented to  Time, Oriented to Situation Alcohol / Substance Use: Not Applicable Psych Involvement: No (comment)  Admission diagnosis:  Acute pulmonary edema (HCC) [J81.0] Acute exacerbation of congestive heart failure ([place]) [I50.9] Patient Active Problem List   Diagnosis Date Noted   Acute exacerbation of congestive heart failure ([place]) 10/24/2021   Acute cardiogenic pulmonary edema ([place]) 10/06/2021   Coronary artery disease involving native coronary artery of native heart without angina pectoris 10/06/2021   Goals of care, counseling/discussion    Gangrene of left  foot ([place])    Cellulitis of left foot    Diabetic ulcer of left foot ([place]) 09/04/2021   CHF (congestive heart failure) ([place]) 07/07/2021   Normocytic anemia 06/30/2021   CKD (chronic kidney disease), stage IV ([place]) 06/11/2021   Acute on chronic diastolic CHF (congestive heart failure) ([place]) 06/10/2021   Abnormal nuclear stress test    Dyspnea on exertion 03/07/2021   Orthopnea 02/25/2021   History of cerebrovascular accident (CVA) with  residual deficit 05/06/2020   Diabetic peripheral neuropathy ([place]) 04/26/2020   AKI (acute kidney injury) ([place]) 04/20/2020   Generalized weakness 04/20/2020   Dehydration 04/20/2020   Generalized abdominal pain    Pancreatitis, acute 02/29/2020   Cerebrovascular accident (CVA) ([place]) 11/08/2019   Stenosis of right carotid artery 11/08/2019   Mixed diabetic hyperlipidemia associated with type 2 diabetes mellitus ([place]) 11/08/2019   Cortical age-related cataract of both eyes 08/30/2019   Gait abnormality 08/30/2019   Statin declined 08/30/2019   Gastroesophageal reflux disease without esophagitis 04/18/2018   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   Type 2 diabetes mellitus with stage 4 chronic kidney disease, with long-term current use of insulin ([place]) 10/29/2017   History of macular degeneration 10/29/2017   Chronic cervical pain 10/29/2016   Myalgia 09/14/2016   Insomnia 09/14/2016   Tinea pedis of both feet 08/20/2016   Macular degeneration 08/17/2016   Low back pain 09/21/2014   Hypertensive urgency 09/21/2014   Diabetic gastroparesis associated with type 2 diabetes mellitus ([place]) 09/14/2014   Uncontrolled hypertension 09/08/2014   Thyroid nodule 08/17/2014   Obesity (BMI 30-39.9) 08/17/2014   Nausea & vomiting 08/04/2014   Type II diabetes mellitus with neurological manifestations, uncontrolled 08/04/2014   PCP:  [name], MD Pharmacy:   [name] [address] [place] [address] Phone: [phone] Fax: [place] [address] [place] [address] Phone: [phone] Fax: [phone]     Social Determinants of Health (SDOH) Interventions    Readmission Risk Interventions Readmission Risk Prevention Plan 09/21/2021 03/07/2020  Transportation Screening Complete Complete  PCP or Specialist Appt within 3-5 Days - Complete  HRI or [place] - (No Data)  Social Work Consult for  [place] Planning/Counseling - Complete  Palliative Care Screening - Not Applicable  Medication Review Press photographer) Complete Complete  PCP or Specialist appointment within 3-5 days of discharge Complete -  [place] or Home Care Consult Complete -  SW Recovery Care/Counseling Consult Complete -  Palliative Care Screening Complete -  [place] Not Applicable -  Some recent data might be hidden

## 2021-10-26 NOTE — Progress Notes (Signed)
Mobility Specialist Progress Note:   10/26/21 1245  Mobility  Activity Transferred:  Bed to chair  Level of Assistance Contact guard assist, steadying assist  Assistive Device Other (Comment) (HHA)  Distance Ambulated (ft) 4 ft  Mobility Sit up in bed/chair position for meals  Mobility Response Tolerated well  Mobility performed by Mobility specialist  Bed Position Chair  $Mobility charge [place] Nancy [name] Mobility Specialist Phone [phone]

## 2021-10-26 NOTE — Progress Notes (Addendum)
Physical Therapy Treatment & Discharge Patient Details Name: Nancy [name] MRN: [id number] DOB: 01/07/1961 Today's Date: 10/26/2021   History of Present Illness Pt is a 60 y.o. female who presented 10/24/21 with SOB and CHF exacerbation. PMH: hypertension, hyperlipidemia, bilateral carotid artery stenosis, CVA 2020, HFpEF EF 55 to 60%, CKD stage V, TIA, type 2 diabetes mellitus with diabetic neuropathy, history of gangrenous left heel ulcer, history of tactile hallucinations, mild CAD, anemia of chronic disease    PT Comments    Pt made significant progress with mobility, not needing any physical assistance for bed mobility, transfers, or bedroom distance gait bout with a RW this date. Pt benefits from using a RW to improve her stability and offload her wounds. Pt does display DOE 3/4 with gait on RA, but SpO2 >/= 90% once sitting following gait bout on RA, re-donned [place] at 2L for pt comfort end of session. All PT goals met. Encouraged pt to continue to mobilize with nursing and mobility specialists. All education completed and questions answered. No further acute PT needs, acute PT will sign off, pt in agreement. Pt would benefit from HHPT follow-up to further progress her mobility.    Recommendations for follow up therapy are one component of a multi-disciplinary discharge planning process, led by the attending physician.  Recommendations may be updated based on patient status, additional functional criteria and insurance authorization.  Follow Up Recommendations  Home health PT     Assistance Recommended at Discharge Intermittent Supervision/Assistance  Equipment Recommendations  None recommended by PT    Recommendations for Other Services       Precautions / Restrictions Precautions Precautions: Fall;Other (comment) Precaution Comments: bil feet ulcers Required Braces or Orthoses: Other Brace Other Brace: L foot PRAFO with ambulation Restrictions Other Position/Activity  Restrictions: heel offloading with PRAFO     Mobility  Bed Mobility Overal bed mobility: Needs Assistance Bed Mobility: Supine to Sit     Supine to sit: HOB elevated;Min guard     General bed mobility comments: Min guard for safety, but pt able to complete with extra time without assist using rails with HOB elevated.    Transfers Overall transfer level: Needs assistance Equipment used: Rolling walker (2 wheels) Transfers: Sit to/from Stand Sit to Stand: Supervision           General transfer comment: Pt able to power up to stand with extra time, 1x from EOB and 1x from recliner, without LOB or assistance, supervision for safety.    Ambulation/Gait Ambulation/Gait assistance: Supervision Gait Distance (Feet): 40 Feet Assistive device: Rolling walker (2 wheels) Gait Pattern/deviations: Step-through pattern;Decreased step length - left;Decreased dorsiflexion - left Gait velocity: decreased Gait velocity interpretation: <1.31 ft/sec, indicative of household ambulator   General Gait Details: Pt with slow gait, dragging L foot majority of time. No LOB, fairly steady, supervision for safety.   Stairs             Wheelchair Mobility    Modified Rankin (Stroke Patients Only)       Balance Overall balance assessment: Needs assistance Sitting-balance support: No upper extremity supported;Feet supported Sitting balance-Leahy Scale: Fair Sitting balance - Comments: Static sitting EOB with supervision for safety.   Standing balance support: Reliant on assistive device for balance Standing balance-Leahy Scale: Poor Standing balance comment: Reliant on RW, no LOB, supervision.                            Cognition  Arousal/Alertness: Awake/alert Behavior During Therapy: Flat affect Overall Cognitive Status: History of cognitive impairments - at baseline                                 General Comments: Pt with flat affect and slow to  process. Per chart, pt with hx of cognitive deficits.        Exercises      General Comments General comments (skin integrity, edema, etc.): Pt on 2L upon arrival, SpO2 >/= 97% on 2L, removed O2 to ambulate SpO2 >/= 90% once sitting following gait on RA; DOE 3/4 though so redonned [place] for pt comfort      Pertinent Vitals/Pain Pain Assessment: Faces Faces Pain Scale: Hurts a little bit Pain Location: heel Pain Descriptors / Indicators: Guarding Pain Intervention(s): Limited activity within patient's tolerance;Monitored during session;Repositioned    Home Living                          Prior Function            PT Goals (current goals can now be found in the care plan section) Acute Rehab PT Goals Patient Stated Goal: to breathe better PT Goal Formulation: All assessment and education complete, DC therapy Time For Goal Achievement: 11/08/21 Potential to Achieve Goals: Fair Progress towards PT goals: Goals met/education completed, patient discharged from PT    Frequency    Min 3X/week      PT Plan Current plan remains appropriate    Co-evaluation              AM-PAC PT "6 Clicks" Mobility   Outcome Measure  Help needed turning from your back to your side while in a flat bed without using bedrails?: A Little Help needed moving from lying on your back to sitting on the side of a flat bed without using bedrails?: A Little Help needed moving to and from a bed to a chair (including a wheelchair)?: A Little Help needed standing up from a chair using your arms (e.g., wheelchair or bedside chair)?: A Little Help needed to walk in hospital room?: A Little Help needed climbing 3-5 steps with a railing? : A Lot 6 Click Score: 17    End of Session Equipment Utilized During Treatment: Oxygen Activity Tolerance: Patient limited by fatigue Patient left: in chair;with call bell/phone within reach;with chair alarm set   PT Visit Diagnosis: Unsteadiness on feet  (R26.81);Other abnormalities of gait and mobility (R26.89);Muscle weakness (generalized) (M62.81);Difficulty in walking, not elsewhere classified (R26.2)     Time: 5053-9767 PT Time Calculation (min) (ACUTE ONLY): 13 min  Charges:  $Therapeutic Activity: 8-22 mins                     Moishe Spice, PT, DPT Acute Rehabilitation Services  Pager: [phone] Office: [place] 10/26/2021, 5:56 PM

## 2021-10-26 NOTE — Progress Notes (Signed)
   10/26/21 1050  Clinical Encounter Type  Visited With Patient  Visit Type Follow-up  Referral From Chaplain  Consult/Referral To Chaplain    Chaplain Jorene Guest followed up with the patient. Chaplain provided spiritual care of prayer and gave her a prayer shawl to take home with her. She appeared to be more talkative today as she spoke of going home. This note was prepared by Jeanine Luz, M.Div..  For questions please contact by phone [phone].

## 2021-10-27 ENCOUNTER — Other Ambulatory Visit: Payer: Self-pay

## 2021-10-27 ENCOUNTER — Inpatient Hospital Stay (HOSPITAL_COMMUNITY): Payer: Medicaid Other

## 2021-10-27 DIAGNOSIS — M869 Osteomyelitis, unspecified: Secondary | ICD-10-CM

## 2021-10-27 DIAGNOSIS — E1122 Type 2 diabetes mellitus with diabetic chronic kidney disease: Secondary | ICD-10-CM

## 2021-10-27 DIAGNOSIS — I739 Peripheral vascular disease, unspecified: Secondary | ICD-10-CM

## 2021-10-27 DIAGNOSIS — E11621 Type 2 diabetes mellitus with foot ulcer: Secondary | ICD-10-CM | POA: Diagnosis not present

## 2021-10-27 DIAGNOSIS — I509 Heart failure, unspecified: Secondary | ICD-10-CM | POA: Diagnosis not present

## 2021-10-27 DIAGNOSIS — Z794 Long term (current) use of insulin: Secondary | ICD-10-CM

## 2021-10-27 DIAGNOSIS — N184 Chronic kidney disease, stage 4 (severe): Secondary | ICD-10-CM | POA: Diagnosis not present

## 2021-10-27 DIAGNOSIS — L97422 Non-pressure chronic ulcer of left heel and midfoot with fat layer exposed: Secondary | ICD-10-CM

## 2021-10-27 LAB — FERRITIN: Ferritin: 316 ng/mL — ABNORMAL HIGH (ref 11–307)

## 2021-10-27 LAB — GLUCOSE, CAPILLARY
Glucose-Capillary: 92 mg/dL (ref 70–99)
Glucose-Capillary: 105 mg/dL — ABNORMAL HIGH (ref 70–99)
Glucose-Capillary: 203 mg/dL — ABNORMAL HIGH (ref 70–99)
Glucose-Capillary: 236 mg/dL — ABNORMAL HIGH (ref 70–99)

## 2021-10-27 LAB — IRON AND TIBC
Iron: 25 ug/dL — ABNORMAL LOW (ref 28–170)
Saturation Ratios: 11 % (ref 10.4–31.8)
TIBC: 231 ug/dL — ABNORMAL LOW (ref 250–450)
UIBC: 206 ug/dL

## 2021-10-27 NOTE — Consult Note (Signed)
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 Patient not improving overtime and given moderate/severe PAD it is unlikely for wound to heal. Recommend consulting orthopedic surgery. Do not recommend antibiotics at this time given no signs of systemic infection.  Acute on chronic HFpEF CKD stage IV: CKD from HTN and T2DM Mod/Severe PAD: Left ABI .5 T2DM on Insulin, with complications of peripheral neuropathy, nephropathy, and gastroparesis. History of uncontrolled diabetes. Most recent Hemoglobin A1c 6.8 in setting of CKD IV   Recommendations: - hold on starting antibiotics given no signs of systemic infection  - recommend consulting orthopedics    Principal Problem:   Acute exacerbation of congestive heart failure (HCC) Active Problems:   Uncontrolled hypertension   Type 2 diabetes mellitus with stage 4 chronic kidney disease, with long-term current use of insulin (HCC)   CKD (chronic kidney disease), stage IV (HCC)   Diabetic ulcer of left foot (HCC)   Gangrene of left foot (HCC)    amLODipine  10 mg Oral Daily   aspirin  81 mg Oral Daily   atorvastatin  40 mg Oral Daily   calcium acetate  667 mg Oral TID WC   carvedilol  18.75 mg Oral BID WC   carvedilol  18.75 mg Oral Once   enoxaparin (LOVENOX) injection  30 mg Subcutaneous Q24H   ferrous sulfate  325 mg Oral Q breakfast   furosemide  60 mg Intravenous Daily   insulin aspart  0-15 Units Subcutaneous TID WC   insulin glargine-yfgn  15 Units Subcutaneous QHS   risperiDONE  3 mg Oral QHS    HPI: Nancy [name] is a 60 y.o. female living with HTN,HLD,PAD, type 2 diabetes complicated by  peripheral neuropathy, CKD stage V, HFpEF, and chronic gangrenous diabetic left foot and ankle ulcers who is admitted for acute on chronic HFpEF exacerbation. Consulted for chronic diabetic foot ulcers. Review of media tabs shows beginnings of heel ulcer 07/05/20 which has progressed per chart review. Admitted Aug-October with these same foot ulcers and MRI suggested severe cellulitis of ankle and hindfoot with possible myositis of lower leg and foot. Patient evaluated by vascular and orthopedic surgery. Transtibial amputation was recommended and patient declined. Initially received IV antibiotic, one dose of IV Vancomycin and then continued on Zosyn. Switched to PO and treated with 21 day course of ( Doxy and Augmentin) , wound care, and Prevalon heel boots. Repeat admission in October for heart failure exacerbation. Readmitted 11/08 for heart failure exacerbation. No further antibiotics. Received bedside debridement on this admission. No fever, chills, pain, erythema or trauma to area.    Review of Systems: A complete ROS was negative except as per HPI  Past Medical History:  Diagnosis Date   Anemia 2006   Depression 2014   previously on amitryptiline    Diabetes mellitus with neurological manifestation ([place]) 2006   Diabetic peripheral neuropathy ([place]) 04/26/2020   Fracture of left ankle 1997    Gastroparesis 07/2016   HOH (hard of hearing) 2004    Hyperlipidemia 2006   Hypertension 2006   IBS (irritable bowel syndrome) 2002  Leukopenia 2015    Macular degeneration 11/2019   Shingles 2009    Stroke [facility]) 2020   Thyroid nodule 2004    Social History   Tobacco Use   Smoking status: Former    Packs/day: 0.25    Years: 0.50    Pack years: 0.13    Types: Cigarettes   Smokeless tobacco: Never   Tobacco comments:    quit yrs ago  Vaping Use   Vaping Use: Never used  Substance Use Topics   Alcohol use: No   Drug use: No    Family History  Problem Relation Age of Onset    Hypertension Mother    Heart disease Mother    Diabetes Mother    Thyroid disease Mother    Congestive Heart Failure Mother    Breast cancer Maternal Grandmother    Colon cancer Maternal Grandfather    Heart attack Sister    Heart disease Brother    Hyperlipidemia Brother    Hypertension Brother    Diabetes Father    Breast cancer Maternal Aunt    Allergies  Allergen Reactions   Elemental Sulfur Hives and Other (See Comments)    PATIENT STATED THIS, MORE THAN LIKELY, SHOULD HAVE BEEN LOGGED AS "SULFA"   Hydralazine Hcl Other (See Comments)    Hair loss   Hydrocodone Itching and Other (See Comments)    Upset stomach   Metformin And Related Nausea And Vomiting and Other (See Comments)    Stomach pains, also   Other Nausea Only and Other (See Comments)    Lettuce- Does not digest this!!   Plaquenil [Hydroxychloroquine Sulfate] Hives   Shellfish-Derived Products Nausea Only and Other (See Comments)    Caused an upset stomach   Shrimp (Diagnostic) Nausea Only and Other (See Comments)    Upset stomach    Sulfa Antibiotics Hives    OBJECTIVE: Blood pressure (!) 142/76, pulse 73, temperature 98.4 F (36.9 C), temperature source Oral, resp. rate 18, height 5\' 3"  (1.6 m), weight 109.1 kg, SpO2 100 %.   General: NAD, chronically ill appearing female reclined in bedside chair with nasal canula out of nostrils HE: Normocephalic, atraumatic ,Conjunctivae normal ENT: No congestion, no rhinorrhea,poor dentition  Cardiovascular: Normal rate, regular rhythm.  No murmurs, rubs, or gallops Pulmonary : Effort normal, no wheezes, rales at bilateral bases  Abdominal: soft, nontender,  bowel sounds present Musculoskeletal: Dressed and heal offloaded in boot. Reviewed images from yesterdays debridement.  Psychiatric/Behavioral:  normal mood, normal behavior    Lab Results Lab Results  Component Value Date   WBC 7.2 10/26/2021   HGB 7.9 (L) 10/26/2021   HCT 25.5 (L) 10/26/2021   MCV 92.1  10/26/2021   PLT 210 10/26/2021    Lab Results  Component Value Date   CREATININE 3.08 (H) 10/26/2021   BUN 38 (H) 10/26/2021   NA 134 (L) 10/26/2021   K 4.1 10/26/2021   CL 99 10/26/2021   CO2 27 10/26/2021    Lab Results  Component Value Date   ALT 9 10/26/2021   AST 9 (L) 10/26/2021   ALKPHOS 46 10/26/2021   BILITOT 1.0 10/26/2021     Microbiology: Recent Results (from the past 240 hour(s))  Resp Panel by RT-PCR (Flu A&B, Covid) Nasopharyngeal Swab     Status: None   Collection Time: 10/24/21  8:14 PM   Specimen: Nasopharyngeal Swab; Nasopharyngeal(NP) swabs in vial transport medium  Result Value Ref Range Status   SARS Coronavirus 2 by RT  PCR NEGATIVE NEGATIVE Final    Comment: (NOTE) SARS-CoV-2 target nucleic acids are NOT DETECTED.  The SARS-CoV-2 RNA is generally detectable in upper respiratory specimens during the acute phase of infection. The lowest concentration of SARS-CoV-2 viral copies this assay can detect is 138 copies/mL. A negative result does not preclude SARS-Cov-2 infection and should not be used as the sole basis for treatment or other patient management decisions. A negative result may occur with  improper specimen collection/handling, submission of specimen other than nasopharyngeal swab, presence of viral mutation(s) within the areas targeted by this assay, and inadequate number of viral copies(<138 copies/mL). A negative result must be combined with clinical observations, patient history, and epidemiological information. The expected result is Negative.  Fact Sheet for Patients:  EntrepreneurPulse.com.au  Fact Sheet for Healthcare Providers:  IncredibleEmployment.be  This test is no t yet approved or cleared by the [place] FDA and  has been authorized for detection and/or diagnosis of SARS-CoV-2 by FDA under an Emergency Use Authorization (EUA). This EUA will remain  in effect (meaning this test can  be used) for the duration of the COVID-19 declaration under Section 564(b)(1) of the Act, 21 U.S.C.section 360bbb-3(b)(1), unless the authorization is terminated  or revoked sooner.       Influenza A by PCR NEGATIVE NEGATIVE Final   Influenza B by PCR NEGATIVE NEGATIVE Final    Comment: (NOTE) The Xpert Xpress SARS-CoV-2/FLU/RSV plus assay is intended as an aid in the diagnosis of influenza from Nasopharyngeal swab specimens and should not be used as a sole basis for treatment. Nasal washings and aspirates are unacceptable for Xpert Xpress SARS-CoV-2/FLU/RSV testing.  Fact Sheet for Patients: EntrepreneurPulse.com.au  Fact Sheet for Healthcare Providers: IncredibleEmployment.be  This test is not yet approved or cleared by the [place] FDA and has been authorized for detection and/or diagnosis of SARS-CoV-2 by FDA under an Emergency Use Authorization (EUA). This EUA will remain in effect (meaning this test can be used) for the duration of the COVID-19 declaration under Section 564(b)(1) of the Act, 21 U.S.C. section 360bbb-3(b)(1), unless the authorization is terminated or revoked.  Performed at [facility], [place] [address] [place], [place] [address]     Tamsen Snider, MD PGY3 Internal Medicine [phone]

## 2021-10-27 NOTE — Hospital Course (Addendum)
Breathing feels better today, feels a little bit lighter and like the swelling is coming down. Working on moving with PT went ok. Already got her xray this morning. Has a crochet blanket that one of the hospital volunteers gave her.   11/16 Back hurts may have slept in a weird position Discussed amputation, still thinking, leaning towards amputation but wants to talk to her husband. Advised to talk to family today and will come back to discuss later today after they discus. No other complaints.

## 2021-10-27 NOTE — Progress Notes (Signed)
Patient ID: Nancy [name], female   DOB: [birth year], 60 y.o.   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 Pt seen with formal consultation 5 weeks ago for same problem; recommendation at that time was amputation but pt refused. Treatment options have not changed. Advised pt again on continued issues with wound care, bouts of sepsis with possible progression of CKD necessitating dialysis, ICU admissions, and even death. Pt still adamant refusing amputation. Orthopedics has nothing else to offer. Please let us know if she changes her mind.    Lisette Abu, PA-C Orthopedic Surgery [phone]

## 2021-10-27 NOTE — Progress Notes (Addendum)
Subjective: I seen and evaluated [name] at bedside.  She was sitting comfortably in the chair next to the bed.  She reports feeling much better.  She states that her breathing has improved.  She states that her hands feel cold and she put on gloves.  She states that this has been ongoing for the past year and is not a new complaint.  There has been no associated color change of the fingers or hands.  Patient denies any other complaints.   Objective:  Vital signs in last 24 hours: Vitals:   10/26/21 0403 10/26/21 1131 10/26/21 2026 10/27/21 0503  BP: 139/86 136/69 140/75 (!) 142/76  Pulse: 68 74 74 73  Resp: 20 18 20 18   Temp: (!) 97.5 F (36.4 C) 97.9 F (36.6 C) 98 F (36.7 C) 98.4 F (36.9 C)  TempSrc: Oral Oral Oral Oral  SpO2: 100% 100% 100% 100%  Weight: 108.8 kg   109.1 kg  Height:       Physical Exam Constitutional:      General: She is awake.     Interventions: Nasal cannula in place.  HENT:     Head: Normocephalic and atraumatic.  Eyes:     General: Lids are normal.  Neck:     Vascular: No JVD.  Cardiovascular:     Rate and Rhythm: Normal rate and regular rhythm.     Pulses:          Radial pulses are 2+ on the right side and 2+ on the left side.     Heart sounds: Normal heart sounds.     Comments: Bilateral hands- capillary refill <2 seconds; good perfusion; no discoloration noted; hands warm to touch. No claudication noted on strength test. Pulmonary:     Breath sounds: Rhonchi present. No decreased breath sounds.  Musculoskeletal:     Right lower leg: 2+ Pitting Edema present.     Left lower leg: 2+ Pitting Edema present.  Feet:     Left foot:     Skin integrity: Ulcer present.     Comments: Left foot- plantar surface stage IV ulcer with necrotic tissue; bone visualization Skin:    General: Skin is warm and dry.  Neurological:     General: No focal deficit present.     Mental Status: She is alert.  Psychiatric:        Behavior: Behavior is  cooperative.     Comments: Improved mood from previous day.     Assessment/Plan:  Principal Problem:   Acute exacerbation of congestive heart failure (HCC) Active Problems:   Uncontrolled hypertension   Type 2 diabetes mellitus with stage 4 chronic kidney disease, with long-term current use of insulin (HCC)   CKD (chronic kidney disease), stage IV (HCC)   Diabetic ulcer of left foot (HCC)   Gangrene of left foot (HCC)   Acute exacerbation of congestive heart failure Acute hypoxic respiratory failure -- IV Lasix 60 mg daily --Hyder O2 PRN -- Carvedilol 18.75 mg twice daily   Hypertension Hyperlipidemia --Amlodipine 10 mg daily --Carvedilol 18.75 twice daily --Lipitor 40 mg daily   Insulin-dependent type 2 diabetes Recent A1c 16.8% (September 2022); --Semglee 15 units --SSI moderate   CKD stage IV GFR <20 -- Avoid nephrotoxic medications --PhosLo 3 times daily   Gangrenous diabetic ulcer of the left foot PAD During bedside debridement yesterday; bone was probed but not visualized. Patient has poor kidney function, MRI with contrast contraindicated. X ray of the left foot reveals  focal rarefaction of the calcaneal tuberosity underlying the large soft tissue ulceration concerning for osteomyelitis. --Consult to ID  --Consult to Ortho --Wound care following --Prevalon boot on left foot --ASA 81mg  daily   Anemia of chronic disease Baseline hemoglobin ~9.  Patient's hemoglobin dropped to 7.9 today.  Consider IV iron supplementation.  However will defer in the setting of acute infection. -- Repeat iron studies --Iron supplement daily   Tactile hallucination --Continue home medication risperidone 3 mg nightly        Prior to Admission Living Arrangement: Anticipated Discharge Location: Barriers to Discharge: Dispo: Anticipated discharge in approximately 1-2 day(s).   Timothy Lasso, MD 10/27/2021, 8:20 AM Pager: [phone] After 5pm on weekdays and 1pm on  weekends: On Call pager [phone]

## 2021-10-27 NOTE — Progress Notes (Signed)
Occupational Therapy Treatment Patient Details Name: Nancy [name] MRN: [id number] DOB: 11/23/1961 Today's Date: 10/27/2021   History of present illness Pt is a 60 y.o. female who presented 10/24/21 with SOB and CHF exacerbation. PMH: hypertension, hyperlipidemia, bilateral carotid artery stenosis, CVA 2020, HFpEF EF 55 to 60%, CKD stage V, TIA, type 2 diabetes mellitus with diabetic neuropathy, history of gangrenous left heel ulcer, history of tactile hallucinations, mild CAD, anemia of chronic disease   OT comments  Pt eager to get OOB. Min assist to stand from bed, min guard to walk with RW to sink for grooming and pericare. Pt remained up in chair at end of session. Much more interactive today and states she is feeling better. VSS   Recommendations for follow up therapy are one component of a multi-disciplinary discharge planning process, led by the attending physician.  Recommendations may be updated based on patient status, additional functional criteria and insurance authorization.    Follow Up Recommendations  No OT follow up    Assistance Recommended at Discharge Intermittent Supervision/Assistance  Equipment Recommendations  None recommended by OT    Recommendations for Other Services      Precautions / Restrictions Precautions Precautions: Fall;Other (comment) Precaution Comments: bil feet ulcers Required Braces or Orthoses: Other Brace Other Brace: L foot PRAFO with ambulation Restrictions Weight Bearing Restrictions: Yes LLE Weight Bearing: Weight bearing as tolerated       Mobility Bed Mobility Overal bed mobility: Modified Independent                  Transfers Overall transfer level: Needs assistance Equipment used: Rolling walker (2 wheels) Transfers: Sit to/from Stand Sit to Stand: Min assist           General transfer comment: cues for hand placement, min assist to rise     Balance Overall balance assessment: Needs assistance    Sitting balance-Leahy Scale: Fair     Standing balance support: Reliant on assistive device for balance Standing balance-Leahy Scale: Poor Standing balance comment: can stand statically for pericare and at sink                           ADL either performed or assessed with clinical judgement   ADL Overall ADL's : Needs assistance/impaired     Grooming: Wash/dry hands;Wash/dry face;Oral care;Standing;Min guard               Lower Body Dressing: Total assistance;Bed level (for PRAFO)       Toileting- Clothing Manipulation and Hygiene: Sit to/from stand;Min guard       Functional mobility during ADLs: Min guard;Rolling walker (2 wheels)      Extremity/Trunk Assessment              Vision       Perception     Praxis      Cognition Arousal/Alertness: Awake/alert Behavior During Therapy: Flat affect Overall Cognitive Status: History of cognitive impairments - at baseline                                            Exercises     Shoulder Instructions       General Comments      Pertinent Vitals/ Pain       Pain Assessment: No/denies pain  Home Living  Prior Functioning/Environment              Frequency  Min 2X/week        Progress Toward Goals  OT Goals(current goals can now be found in the care plan section)  Progress towards OT goals: Progressing toward goals  Acute Rehab OT Goals OT Goal Formulation: With patient Time For Goal Achievement: 11/08/21 Potential to Achieve Goals: Good  Plan Discharge plan remains appropriate    Co-evaluation                 AM-PAC OT "6 Clicks" Daily Activity     Outcome Measure   Help from another person eating meals?: A Little Help from another person taking care of personal grooming?: A Little Help from another person toileting, which includes using toliet, bedpan, or urinal?: A Little Help  from another person bathing (including washing, rinsing, drying)?: A Lot Help from another person to put on and taking off regular upper body clothing?: A Little Help from another person to put on and taking off regular lower body clothing?: A Lot 6 Click Score: 16    End of Session Equipment Utilized During Treatment: Rolling walker (2 wheels);Gait belt;Oxygen  OT Visit Diagnosis: Unsteadiness on feet (R26.81)   Activity Tolerance Patient tolerated treatment well   Patient Left in chair;with call bell/phone within reach;with chair alarm set   Nurse Communication          Time: [phone] OT Time Calculation (min): 25 min  Charges: OT General Charges $OT Visit: 1 Visit OT Treatments $Self Care/Home Management : 23-37 mins  Nestor Lewandowsky, OTR/L Acute Rehabilitation Services Pager: [phone] Office: [phone]   Malka So 10/27/2021, 9:55 AM

## 2021-10-28 DIAGNOSIS — L97426 Non-pressure chronic ulcer of left heel and midfoot with bone involvement without evidence of necrosis: Secondary | ICD-10-CM

## 2021-10-28 DIAGNOSIS — E11621 Type 2 diabetes mellitus with foot ulcer: Secondary | ICD-10-CM | POA: Diagnosis not present

## 2021-10-28 LAB — BASIC METABOLIC PANEL
Anion gap: 7 (ref 5–15)
BUN: 49 mg/dL — ABNORMAL HIGH (ref 8–23)
CO2: 27 mmol/L (ref 22–32)
Calcium: 8.8 mg/dL — ABNORMAL LOW (ref 8.9–10.3)
Chloride: 99 mmol/L (ref 98–111)
Creatinine, Ser: 3.41 mg/dL — ABNORMAL HIGH (ref 0.44–1.00)
GFR, Estimated: 15 mL/min — ABNORMAL LOW (ref >60)
Glucose, Bld: 113 mg/dL — ABNORMAL HIGH (ref 70–99)
Potassium: 4.3 mmol/L (ref 3.5–5.1)
Sodium: 133 mmol/L — ABNORMAL LOW (ref 135–145)

## 2021-10-28 LAB — GLUCOSE, CAPILLARY
Glucose-Capillary: 113 mg/dL — ABNORMAL HIGH (ref 70–99)
Glucose-Capillary: 122 mg/dL — ABNORMAL HIGH (ref 70–99)
Glucose-Capillary: 174 mg/dL — ABNORMAL HIGH (ref 70–99)
Glucose-Capillary: 179 mg/dL — ABNORMAL HIGH (ref 70–99)

## 2021-10-28 LAB — CBC WITH DIFFERENTIAL/PLATELET
Abs Immature Granulocytes: 0.02 10*3/uL (ref 0.00–0.07)
Basophils Absolute: 0.1 10*3/uL (ref 0.0–0.1)
Basophils Relative: 1 %
Eosinophils Absolute: 0.8 10*3/uL — ABNORMAL HIGH (ref 0.0–0.5)
Eosinophils Relative: 11 %
HCT: 24.4 % — ABNORMAL LOW (ref 36.0–46.0)
Hemoglobin: 7.7 g/dL — ABNORMAL LOW (ref 12.0–15.0)
Immature Granulocytes: 0 %
Lymphocytes Relative: 22 %
Lymphs Abs: 1.6 10*3/uL (ref 0.7–4.0)
MCH: 28.8 pg (ref 26.0–34.0)
MCHC: 31.6 g/dL (ref 30.0–36.0)
MCV: 91.4 fL (ref 80.0–100.0)
Monocytes Absolute: 0.7 10*3/uL (ref 0.1–1.0)
Monocytes Relative: 9 %
Neutro Abs: 4.2 10*3/uL (ref 1.7–7.7)
Neutrophils Relative %: 57 %
Platelets: 219 10*3/uL (ref 150–400)
RBC: 2.67 MIL/uL — ABNORMAL LOW (ref 3.87–5.11)
RDW: 14.6 % (ref 11.5–15.5)
WBC: 7.3 10*3/uL (ref 4.0–10.5)
nRBC: 0 % (ref 0.0–0.2)

## 2021-10-28 NOTE — Social Work (Signed)
CSW spoke with pt about DC plan and attempted to provide resources. Pt declined resources, she states she and her husband is on the wait list for housing but until that is approved they are living in a hotel. Pt disability check pays for the hotel for th month.

## 2021-10-28 NOTE — Progress Notes (Signed)
Subjective: No acute events overnight. Patient was evaluated at the bedside sitting comfortably in bed. Reports some shortness of breath at night but none this morning. States she feels more short of breath when she lays flat in the bed.  She denies any leg pain or chest pain.  Reports that orthopedic surgery came back to discuss amputation with her but she is still processing this.  States is okay to reach out to her husband.  Objective:  Vital signs in last 24 hours: Vitals:   10/27/21 0503 10/27/21 1209 10/27/21 1944 10/28/21 0320  BP: (!) 142/76 (!) 154/60 (!) 144/58 (!) 142/73  Pulse: 73 78 76 71  Resp: 18 18 16 16   Temp: 98.4 F (36.9 C) 98.1 F (36.7 C) 98.1 F (36.7 C) 98.2 F (36.8 C)  TempSrc: Oral Oral Oral Oral  SpO2: 100% 100% 100% 100%  Weight: 109.1 kg   111.1 kg  Height:       General: Pleasant, well-appearing elderly woman sitting in chair. No acute distress. CV: RRR. No murmurs, rubs, or gallops. 2+ BLE edema Pulmonary: On room air. Decreased air movement at the bases.  Distant rales at the bases.  No wheezing. Abdominal: Soft, nontender, nondistended. Normal bowel sounds. Extremities: Palpable radial and DP pulses. Normal ROM. Skin: Left foot plantar stage IV ulcer with necrotic tissue covered with a dry dressing with offloaded heel in Prevalon boot Neuro: A&Ox3. Moves all extremities. Normal sensation. No focal deficit. Psych: Reflective and slightly depressed mood   Assessment/Plan:  Principal Problem:   Diabetic ulcer of left foot (HCC) Active Problems:   Uncontrolled hypertension   Type 2 diabetes mellitus with stage 4 chronic kidney disease, with long-term current use of insulin (HCC)   CKD (chronic kidney disease), stage IV (HCC)   Gangrene of left foot (HCC)   Acute exacerbation of congestive heart failure ([place])   Peripheral artery disease ([place])   Acute on chronic diastolic heart failure Echo on 10/06/21 showed EF of 50 to 55%, moderate LVH  and G2DD. Diuresing appropriately. Net I&O of -1.9 L since admission. 650 cc out last 24 hours. Slight bump in creatinine to 3.41 but relatively stable GFR. Off supplemental O2 this morning and maintaining O2 sats above 92%. --Continue IV Lasix 60 mg daily --Continue Coreg 18.75 mg twice daily --Daily weights, strict I&O's --Daily BMP   Left foot osteomyelitis  PAD Patient with a history of diabetic foot ulcer unresponsive to outpatient treatment via wound care center. Status post bedside debridement on 11/10. Found to have osteomyelitis of the left foot during admission.  Patient has moderate to severe PAD making antibiotics treatment essentially futile. Multiple discussions has been had with patient about amputation being the only definite treatment to avoid soft tissue infection and systemic illness. Patient states she is still processing this and needs some time to think about it. No signs of systemic illness at the moment. --Continue to explore thoughts on amputation and her goals of care --ID following, appreciate recs --Ortho available for reconsult if patient agrees to amputation --Continue wound care --Prevalon boots on left foot --Continue ASA 81 mg daily --Monitor temperature and WC   Hypertension Hyperlipidemia Stable.  SBP in the 140s overnight. --Continue amlodipine 10 mg daily --Continue carvedilol 18.75 twice daily --Continue Lipitor 40 mg daily   Insulin-dependent type 2 diabetes Recent A1c of 6.8% 2 months ago. Blood sugars much better.  CBG in the 110s to 120s.  -- Continue Semglee 15 units --SSI moderate  CKD stage IV GFR <20 Relatively stable.  Baseline creatinine around 3.0-3.1. Mild bump in creatinine from 3.08-->3.41. We will continue to monitor urine output. --Continue PhosLo 3 times a day --Avoid nephrotoxic agents -- Daily BMP   Anemia of chronic disease Iron deficiency anemia Baseline hemoglobin ~9. Hemoglobin has been slowly downtrending since  admission. Hgb of 7.7 today from 7.9 yesterday. No signs of active bleed at the moment.  Iron levels 25 with iron sat of 11%. We will continue to monitor closely. --Continue iron supplementation daily --Daily CBC  Tactile hallucination Stable. On home risperidone 3 mg nightly    Prior to Admission Living Arrangement: Anticipated Discharge Location: Barriers to Discharge: Dispo: Anticipated discharge in approximately 1-2 day(s).   Lacinda Axon, MD 10/28/2021, 6:39 AM Pager: [phone] After 5pm on weekdays and 1pm on weekends: On Call pager [phone]

## 2021-10-29 DIAGNOSIS — E11621 Type 2 diabetes mellitus with foot ulcer: Secondary | ICD-10-CM | POA: Diagnosis not present

## 2021-10-29 DIAGNOSIS — M86372 Chronic multifocal osteomyelitis, left ankle and foot: Secondary | ICD-10-CM

## 2021-10-29 DIAGNOSIS — M869 Osteomyelitis, unspecified: Secondary | ICD-10-CM

## 2021-10-29 DIAGNOSIS — M86271 Subacute osteomyelitis, right ankle and foot: Secondary | ICD-10-CM

## 2021-10-29 DIAGNOSIS — I509 Heart failure, unspecified: Secondary | ICD-10-CM | POA: Diagnosis not present

## 2021-10-29 DIAGNOSIS — N184 Chronic kidney disease, stage 4 (severe): Secondary | ICD-10-CM | POA: Diagnosis not present

## 2021-10-29 LAB — BASIC METABOLIC PANEL
Anion gap: 8 (ref 5–15)
BUN: 50 mg/dL — ABNORMAL HIGH (ref 8–23)
CO2: 27 mmol/L (ref 22–32)
Calcium: 8.7 mg/dL — ABNORMAL LOW (ref 8.9–10.3)
Chloride: 98 mmol/L (ref 98–111)
Creatinine, Ser: 3.3 mg/dL — ABNORMAL HIGH (ref 0.44–1.00)
GFR, Estimated: 15 mL/min — ABNORMAL LOW (ref >60)
Glucose, Bld: 100 mg/dL — ABNORMAL HIGH (ref 70–99)
Potassium: 4.2 mmol/L (ref 3.5–5.1)
Sodium: 133 mmol/L — ABNORMAL LOW (ref 135–145)

## 2021-10-29 LAB — GLUCOSE, CAPILLARY
Glucose-Capillary: 150 mg/dL — ABNORMAL HIGH (ref 70–99)
Glucose-Capillary: 124 mg/dL — ABNORMAL HIGH (ref 70–99)
Glucose-Capillary: 104 mg/dL — ABNORMAL HIGH (ref 70–99)
Glucose-Capillary: 169 mg/dL — ABNORMAL HIGH (ref 70–99)
Glucose-Capillary: 160 mg/dL — ABNORMAL HIGH (ref 70–99)

## 2021-10-29 LAB — CBC WITH DIFFERENTIAL/PLATELET
Abs Immature Granulocytes: 0.02 10*3/uL (ref 0.00–0.07)
Basophils Absolute: 0.1 10*3/uL (ref 0.0–0.1)
Basophils Relative: 1 %
Eosinophils Absolute: 0.8 10*3/uL — ABNORMAL HIGH (ref 0.0–0.5)
Eosinophils Relative: 10 %
HCT: 23.4 % — ABNORMAL LOW (ref 36.0–46.0)
Hemoglobin: 7.1 g/dL — ABNORMAL LOW (ref 12.0–15.0)
Immature Granulocytes: 0 %
Lymphocytes Relative: 27 %
Lymphs Abs: 2.1 10*3/uL (ref 0.7–4.0)
MCH: 28.1 pg (ref 26.0–34.0)
MCHC: 30.3 g/dL (ref 30.0–36.0)
MCV: 92.5 fL (ref 80.0–100.0)
Monocytes Absolute: 0.7 10*3/uL (ref 0.1–1.0)
Monocytes Relative: 9 %
Neutro Abs: 4.1 10*3/uL (ref 1.7–7.7)
Neutrophils Relative %: 53 %
Platelets: 224 10*3/uL (ref 150–400)
RBC: 2.53 MIL/uL — ABNORMAL LOW (ref 3.87–5.11)
RDW: 14.6 % (ref 11.5–15.5)
WBC: 7.9 10*3/uL (ref 4.0–10.5)
nRBC: 0 % (ref 0.0–0.2)

## 2021-10-29 MED ORDER — FUROSEMIDE 10 MG/ML IJ SOLN
40.0000 mg | Freq: Once | INTRAMUSCULAR | Status: AC
  Administered 2021-10-29: 40 mg via INTRAVENOUS
  Filled 2021-10-29: qty 4

## 2021-10-29 NOTE — Plan of Care (Signed)
  Problem: Activity: Goal: Ability to tolerate increased activity will improve Outcome: Progressing   Problem: Respiratory: Goal: Levels of oxygenation will improve Outcome: Progressing   Problem: Respiratory: Goal: Ability to maintain adequate ventilation will improve Outcome: Progressing

## 2021-10-29 NOTE — Progress Notes (Signed)
Subjective: I seen and evaluated Nancy [name] at bedside.  She had just finished eating breakfast.  She complains of shortness of breath.  Increased work of breathing noted while speaking with her. She was on [place] 1L.   Objective:  Vital signs in last 24 hours: Vitals:   10/28/21 0918 10/28/21 2057 10/29/21 0321 10/29/21 0832  BP: (!) 144/67 (!) 158/81 (!) 176/81 (!) 137/54  Pulse: 77 74 72 77  Resp:  16 16 20   Temp: 98.3 F (36.8 C) 97.7 F (36.5 C) 98.5 F (36.9 C) 98.9 F (37.2 C)  TempSrc: Oral Oral Oral Oral  SpO2: 92% 100% 100% 100%  Weight:   110.5 kg   Height:       Physical Exam Constitutional:      Interventions: Nasal cannula in place.  HENT:     Head: Normocephalic and atraumatic.  Cardiovascular:     Rate and Rhythm: Normal rate and regular rhythm.     Heart sounds: Normal heart sounds.  Pulmonary:     Effort: Pulmonary effort is normal.     Breath sounds: Decreased breath sounds present.  Musculoskeletal:     Right lower leg: 1+ Edema present.     Left lower leg: 1+ Edema present.  Feet:     Comments: Left foot wrapped in bandage and Prevalon boot present Skin:    General: Skin is warm and dry.  Neurological:     General: No focal deficit present.     Mental Status: She is alert.  Psychiatric:        Behavior: Behavior is cooperative.     Assessment/Plan:  Principal Problem:   Diabetic ulcer of left foot ([place]) Active Problems:   Uncontrolled hypertension   Type 2 diabetes mellitus with stage 4 chronic kidney disease, with long-term current use of insulin (HCC)   CKD (chronic kidney disease), stage IV (HCC)   Gangrene of left foot (HCC)   Acute exacerbation of congestive heart failure (HCC)   Peripheral artery disease (HCC)  Acute on chronic diastolic heart failure 542 cc out last 24 hours.  Patient required supplemental oxygen overnight after satting well on room air previous day.  Today patient noted to have increased work of breathing on  physical exam.  Oxygen increased to 2 L; patient satting at 100%.  Lower extremity swelling has improved.   --IV Lasix 60 mg daily --O2 supplementation PRN; wean off as appropriate --Coreg 18.75 mg twice daily --Daily weights, strict I's and O's --Daily BMP  Left foot osteomyelitis PAD Discussion was had with patient regarding decision about amputation of the left foot.  Patient states she is still thinking about it and is concerned about the pain and suffering following the amputation.  Patient was reassured that adequate medical support will be provided if she decides to go through with left foot amputation.  The risks of delaying amputation was explained to the patient.  Currently, patient denies fever or chills.  No signs of systemic illness at this time. --Continue to explore thoughts on amputation and her goals of care --Continue wound care --Prevalon boot on left foot --Continue ASA 81 mg daily --Monitor temperature and WBC  Hypertension Hyperlipidemia Patient had elevated pressures overnight; SBP >150s.  This morning after receiving morning BP meds patient's blood pressure stabilized. --Amlodipine 10 mg daily --Carvedilol 18.75 twice daily --Lipitor 40 mg daily  Insulin-dependent type 2 diabetes Recent A1c of 6.3%; CBGs acceptable --Semglee 15 units --SSI moderate  CKD stage IV GFR <  20 Baseline creatinine ~3.0-3.2.  Current creatinine 3.3; stable --PhosLo 3 times daily --Avoid nephrotoxic agents --Daily BMP  Anemia of chronic disease Iron deficiency anemia Baseline hemoglobin ~9; hemoglobin has been trending down since admission.  Today hemoglobin is 7.1 down from 7.7 yesterday.  Patient shows no sign of active bleeding.  On repeat iron studies; iron levels 25 with iron sat of 11%.  Iron supplements has a tendency to change the color of stool (darken), unable to determine if patient is experiencing melena.  Consider FOBT --Iron supplementation daily --Daily  CBC  Tactile hallucination --Risperidone 3 mg nightly  Prior to Admission Living Arrangement: Anticipated Discharge Location: Barriers to Discharge: Dispo: Anticipated discharge in approximately 1-2 day(s).   Timothy Lasso, MD 10/29/2021, 9:14 AM Pager: [phone] After 5pm on weekdays and 1pm on weekends: On Call pager [phone]

## 2021-10-29 NOTE — Progress Notes (Signed)
Patient requesting breathing treatment, provider notified.

## 2021-10-29 NOTE — Significant Event (Signed)
Patient and family called out stating that pt was having trouble breathing. when writer and night night arrived to the room for bedside report It felt was hot. No distress applied oxygen and turned heat down. Patient expressed breathing better and stated has history of asthma and take an inhaler once a month.

## 2021-10-30 ENCOUNTER — Inpatient Hospital Stay (HOSPITAL_COMMUNITY): Payer: Medicaid Other

## 2021-10-30 LAB — CBC WITH DIFFERENTIAL/PLATELET
Abs Immature Granulocytes: 0.02 10*3/uL (ref 0.00–0.07)
Basophils Absolute: 0.1 10*3/uL (ref 0.0–0.1)
Basophils Relative: 1 %
Eosinophils Absolute: 0.8 10*3/uL — ABNORMAL HIGH (ref 0.0–0.5)
Eosinophils Relative: 10 %
HCT: 24.5 % — ABNORMAL LOW (ref 36.0–46.0)
Hemoglobin: 7.4 g/dL — ABNORMAL LOW (ref 12.0–15.0)
Immature Granulocytes: 0 %
Lymphocytes Relative: 26 %
Lymphs Abs: 2.1 10*3/uL (ref 0.7–4.0)
MCH: 28.1 pg (ref 26.0–34.0)
MCHC: 30.2 g/dL (ref 30.0–36.0)
MCV: 93.2 fL (ref 80.0–100.0)
Monocytes Absolute: 0.8 10*3/uL (ref 0.1–1.0)
Monocytes Relative: 10 %
Neutro Abs: 4.2 10*3/uL (ref 1.7–7.7)
Neutrophils Relative %: 53 %
Platelets: 245 10*3/uL (ref 150–400)
RBC: 2.63 MIL/uL — ABNORMAL LOW (ref 3.87–5.11)
RDW: 14.6 % (ref 11.5–15.5)
WBC: 8 10*3/uL (ref 4.0–10.5)
nRBC: 0 % (ref 0.0–0.2)

## 2021-10-30 LAB — BASIC METABOLIC PANEL
Anion gap: 8 (ref 5–15)
BUN: 53 mg/dL — ABNORMAL HIGH (ref 8–23)
CO2: 26 mmol/L (ref 22–32)
Calcium: 9.1 mg/dL (ref 8.9–10.3)
Chloride: 101 mmol/L (ref 98–111)
Creatinine, Ser: 3.31 mg/dL — ABNORMAL HIGH (ref 0.44–1.00)
GFR, Estimated: 15 mL/min — ABNORMAL LOW (ref >60)
Glucose, Bld: 111 mg/dL — ABNORMAL HIGH (ref 70–99)
Potassium: 4.5 mmol/L (ref 3.5–5.1)
Sodium: 135 mmol/L (ref 135–145)

## 2021-10-30 LAB — GLUCOSE, CAPILLARY
Glucose-Capillary: 103 mg/dL — ABNORMAL HIGH (ref 70–99)
Glucose-Capillary: 140 mg/dL — ABNORMAL HIGH (ref 70–99)
Glucose-Capillary: 311 mg/dL — ABNORMAL HIGH (ref 70–99)
Glucose-Capillary: 193 mg/dL — ABNORMAL HIGH (ref 70–99)

## 2021-10-30 MED ORDER — COLLAGENASE 250 UNIT/GM EX OINT
TOPICAL_OINTMENT | Freq: Every day | CUTANEOUS | Status: DC
  Filled 2021-10-30: qty 30

## 2021-10-30 NOTE — Consult Note (Signed)
[facility] Nurse wound follow up Contacted via page today by Provider, P.Amponsah who requested that I place an order in the EMR for collagenase (Santyl) for treatment of the left foot wounds.  I have done that at her request. Nursing to perform.  [facility] nursing team will not follow, but will remain available to this patient, the nursing and medical teams.  Please re-consult if needed. Thanks, Maudie Flakes, MSN, RN, [place], Arther Abbott  Pager# [phone]

## 2021-10-30 NOTE — Progress Notes (Signed)
[facility]  Date of Admission:  10/24/2021     Total days of antibiotics 0         ASSESSMENT:  Nancy [name] has a non-healing diabetic foot ulcer with concern for calcaneal osteomyelitis. Orthopedics has recommended amputation which she is continuing to refuse. We discussed that without surgical intervention antibiotics are unlikely to have any benefit. This is further complicated by her PAD would likely need Vascular Surgery evaluation to optimize circulation. As she continues to remain stable there is no immediate indication for antibiotics. Continue diabetes management and remaining medical care per primary team.   PLAN:  Recommend no antibiotics at this time.  Continue discussion regarding surgical intervention. Consider Vascular Surgery evaluation.  Diabetes and remaining medical care per primary team. ID will follow peripherally.   Principal Problem:   Diabetic ulcer of left foot ([place]) Active Problems:   Uncontrolled hypertension   Type 2 diabetes mellitus with stage 4 chronic kidney disease, with long-term current use of insulin (HCC)   CKD (chronic kidney disease), stage IV (HCC)   Gangrene of left foot (HCC)   Acute exacerbation of congestive heart failure (HCC)   Peripheral artery disease (HCC)   Osteomyelitis (HCC)    amLODipine  10 mg Oral Daily   aspirin  81 mg Oral Daily   atorvastatin  40 mg Oral Daily   calcium acetate  667 mg Oral TID WC   carvedilol  18.75 mg Oral BID WC   collagenase   Topical Daily   enoxaparin (LOVENOX) injection  30 mg Subcutaneous Q24H   ferrous sulfate  325 mg Oral Q breakfast   furosemide  60 mg Intravenous Daily   insulin aspart  0-15 Units Subcutaneous TID WC   insulin glargine-yfgn  15 Units Subcutaneous QHS   risperiDONE  3 mg Oral QHS    SUBJECTIVE:  Afebrile overnight with no acute events. Denies pain, fever or chills.   Allergies  Allergen Reactions   Elemental Sulfur Hives and Other  (See Comments)    PATIENT STATED THIS, MORE THAN LIKELY, SHOULD HAVE BEEN LOGGED AS "SULFA"   Hydralazine Hcl Other (See Comments)    Hair loss   Hydrocodone Itching and Other (See Comments)    Upset stomach   Metformin And Related Nausea And Vomiting and Other (See Comments)    Stomach pains, also   Other Nausea Only and Other (See Comments)    Lettuce- Does not digest this!!   Plaquenil [Hydroxychloroquine Sulfate] Hives   Shellfish-Derived Products Nausea Only and Other (See Comments)    Caused an upset stomach   Shrimp (Diagnostic) Nausea Only and Other (See Comments)    Upset stomach    Sulfa Antibiotics Hives     Review of Systems: Review of Systems  Constitutional:  Negative for chills, fever and weight loss.  Respiratory:  Negative for cough, shortness of breath and wheezing.   Cardiovascular:  Negative for chest pain and leg swelling.  Gastrointestinal:  Negative for abdominal pain, constipation, diarrhea, nausea and vomiting.  Skin:  Negative for rash.     OBJECTIVE: Vitals:   10/30/21 0356 10/30/21 0601 10/30/21 0856 10/30/21 1108  BP: (!) 152/59  (!) 156/77 126/78  Pulse: 68  71 68  Resp: (!) 22  16 17   Temp: 98.4 F (36.9 C)  98.3 F (36.8 C)   TempSrc: Oral  Oral   SpO2: 99%  100% 100%  Weight:  110.9 kg    Height:  Body mass index is 43.31 kg/m.  Physical Exam Constitutional:      General: She is not in acute distress.    Appearance: She is well-developed.     Comments: Seated in the chair beside the bed; pleasant; quiet.   Cardiovascular:     Rate and Rhythm: Normal rate and regular rhythm.     Heart sounds: Normal heart sounds.  Pulmonary:     Effort: Pulmonary effort is normal.     Breath sounds: Normal breath sounds.  Skin:    General: Skin is warm and dry.  Neurological:     Mental Status: She is alert.    Lab Results Lab Results  Component Value Date   WBC 8.0 10/30/2021   HGB 7.4 (L) 10/30/2021   HCT 24.5 (L) 10/30/2021    MCV 93.2 10/30/2021   PLT 245 10/30/2021    Lab Results  Component Value Date   CREATININE 3.31 (H) 10/30/2021   BUN 53 (H) 10/30/2021   NA 135 10/30/2021   K 4.5 10/30/2021   CL 101 10/30/2021   CO2 26 10/30/2021    Lab Results  Component Value Date   ALT 9 10/26/2021   AST 9 (L) 10/26/2021   ALKPHOS 46 10/26/2021   BILITOT 1.0 10/26/2021     Microbiology: Recent Results (from the past 240 hour(s))  Resp Panel by RT-PCR (Flu A&B, Covid) Nasopharyngeal Swab     Status: None   Collection Time: 10/24/21  8:14 PM   Specimen: Nasopharyngeal Swab; Nasopharyngeal(NP) swabs in vial transport medium  Result Value Ref Range Status   SARS Coronavirus 2 by RT PCR NEGATIVE NEGATIVE Final    Comment: (NOTE) SARS-CoV-2 target nucleic acids are NOT DETECTED.  The SARS-CoV-2 RNA is generally detectable in upper respiratory specimens during the acute phase of infection. The lowest concentration of SARS-CoV-2 viral copies this assay can detect is 138 copies/mL. A negative result does not preclude SARS-Cov-2 infection and should not be used as the sole basis for treatment or other patient management decisions. A negative result may occur with  improper specimen collection/handling, submission of specimen other than nasopharyngeal swab, presence of viral mutation(s) within the areas targeted by this assay, and inadequate number of viral copies(<138 copies/mL). A negative result must be combined with clinical observations, patient history, and epidemiological information. The expected result is Negative.  Fact Sheet for Patients:  EntrepreneurPulse.com.au  Fact Sheet for Healthcare Providers:  IncredibleEmployment.be  This test is no t yet approved or cleared by the [place] FDA and  has been authorized for detection and/or diagnosis of SARS-CoV-2 by FDA under an Emergency Use Authorization (EUA). This EUA will remain  in effect (meaning this  test can be used) for the duration of the COVID-19 declaration under Section 564(b)(1) of the Act, 21 U.S.C.section 360bbb-3(b)(1), unless the authorization is terminated  or revoked sooner.       Influenza A by PCR NEGATIVE NEGATIVE Final   Influenza B by PCR NEGATIVE NEGATIVE Final    Comment: (NOTE) The Xpert Xpress SARS-CoV-2/FLU/RSV plus assay is intended as an aid in the diagnosis of influenza from Nasopharyngeal swab specimens and should not be used as a sole basis for treatment. Nasal washings and aspirates are unacceptable for Xpert Xpress SARS-CoV-2/FLU/RSV testing.  Fact Sheet for Patients: EntrepreneurPulse.com.au  Fact Sheet for Healthcare Providers: IncredibleEmployment.be  This test is not yet approved or cleared by the [place] FDA and has been authorized for detection and/or diagnosis of SARS-CoV-2 by FDA  under an Emergency Use Authorization (EUA). This EUA will remain in effect (meaning this test can be used) for the duration of the COVID-19 declaration under Section 564(b)(1) of the Act, 21 U.S.C. section 360bbb-3(b)(1), unless the authorization is terminated or revoked.  Performed at [facility], [place] [address], [place], [name] [address]      Terri Piedra, [facility]ious [facility]  10/30/2021  3:59 PM

## 2021-10-30 NOTE — Progress Notes (Signed)
Subjective: I seen and evaluated [name] at bedside.  She states that she is feeling fine.  She she states that she is breathing better.  She is still on nasal cannula 1 L.  She was sitting comfortably in the chair.  She denies any pain at this time.  She expresses concern about amputation of her left foot, she states that she is worried about the pain after the procedure.  She was reassured that we will go into detail discussion tomorrow, she acknowledges and agrees.  Objective:  Vital signs in last 24 hours: Vitals:   10/30/21 0356 10/30/21 0601 10/30/21 0856 10/30/21 1108  BP: (!) 152/59  (!) 156/77 126/78  Pulse: 68  71 68  Resp: (!) 22  16 17   Temp: 98.4 F (36.9 C)  98.3 F (36.8 C)   TempSrc: Oral  Oral   SpO2: 99%  100% 100%  Weight:  110.9 kg    Height:       Physical Exam HENT:     Head: Normocephalic and atraumatic.  Eyes:     General: Lids are normal.  Cardiovascular:     Rate and Rhythm: Normal rate and regular rhythm.     Heart sounds: Normal heart sounds.  Pulmonary:     Effort: Pulmonary effort is normal.     Breath sounds: Decreased breath sounds present.  Musculoskeletal:     Right lower leg: 1+ Edema present.     Left lower leg: 1+ Edema present.  Feet:     Right foot:     Toenail Condition: Right toenails are abnormally thick.     Left foot:     Skin integrity: Ulcer, skin breakdown and dry skin present.     Toenail Condition: Left toenails are abnormally thick.     Comments: Left foot and right foot- see image Skin:    General: Skin is warm and dry.  Neurological:     General: No focal deficit present.     Mental Status: She is alert.  Psychiatric:        Behavior: Behavior normal. Behavior is cooperative.   Left foot  Right foot    Assessment/Plan:  Principal Problem:   Diabetic ulcer of left foot ([place]) Active Problems:   Uncontrolled hypertension   Type 2 diabetes mellitus with stage 4 chronic kidney disease, with long-term  current use of insulin (HCC)   CKD (chronic kidney disease), stage IV (HCC)   Gangrene of left foot (HCC)   Acute exacerbation of congestive heart failure ([place])   Peripheral artery disease (HCC)   Osteomyelitis (HCC)  Acute on chronic diastolic heart failure Overnight patient requested breathing treatment due to shortness of breath.  Today patient states that she is feeling breathing is much better.  She is on nasal cannula 1 L.  On physical exam, lungs sound clear bilaterally.  Lower extremity edema has improved +1 edema nonpitting. --IV Lasix 60 mg daily --O2 supplementation PRN; wean off as appropriate --Coreg 18.75 mg twice daily --Daily weights, strict I's and O's --Daily BMP   Left foot osteomyelitis PAD Patient expresses interest in possible amputation.  Will discuss tomorrow in detail.  On physical exam, ulcer present on the fifth metatarsal on the right foot.  Suspicious for osteomyelitis. --X-ray of right foot ordered --Continue to explore thoughts on amputation and her goals of care --Continue wound care with santyl and daily dressing changes --Prevalon boot on left foot --Continue ASA 81 mg daily --Monitor temperature and WBC  Hypertension Hyperlipidemia Overnight patient's blood pressure elevated SBP 150s.  After morning meds patient currently normotensive --Amlodipine 10 mg daily --Carvedilol 18.75 twice daily --Lipitor 40 mg daily   Insulin-dependent type 2 diabetes Recent A1c of 6.3%; CBGs acceptable --Semglee 15 units --SSI moderate   CKD stage IV GFR <20 Baseline creatinine ~3.0-3.2. Stable --PhosLo 3 times daily --Avoid nephrotoxic agents --Daily BMP   Anemia of chronic disease Iron deficiency anemia Baseline hemoglobin ~9; hemoglobin has been trending down since admission.  Today hemoglobin is 7.4. Patient shows no sign of active bleeding.  --Iron supplementation daily --Daily CBC   Tactile hallucination --Risperidone 3 mg nightly  Prior to  Admission Living Arrangement: Anticipated Discharge Location: Barriers to Discharge: Dispo: Anticipated discharge in approximately 1-2 day(s).   Timothy Lasso, MD 10/30/2021, 12:29 PM Pager: [phone] After 5pm on weekdays and 1pm on weekends: On Call pager [phone]

## 2021-10-30 NOTE — Progress Notes (Signed)
Occupational Therapy Treatment Patient Details Name: Nancy [name] MRN: [id number] DOB: 03-Jan-1961 Today's Date: 10/30/2021   History of present illness Pt is a 60 y.o. female who presented 10/24/21 with SOB and CHF exacerbation. PMH: hypertension, hyperlipidemia, bilateral carotid artery stenosis, CVA 2020, HFpEF EF 55 to 60%, CKD stage V, TIA, type 2 diabetes mellitus with diabetic neuropathy, history of gangrenous left heel ulcer, history of tactile hallucinations, mild CAD, anemia of chronic disease   OT comments  Pt less interactive today, concerned about MD's recommendation for LLE amputation, reinforced benefits and progression of mobility s/p amputation should pt agree to the surgery. Pt ambulated in room with walker, used BSC and performed standing grooming with min guard assist. VSS on 2L 02.   Recommendations for follow up therapy are one component of a multi-disciplinary discharge planning process, led by the attending physician.  Recommendations may be updated based on patient status, additional functional criteria and insurance authorization.    Follow Up Recommendations  No OT follow up    Assistance Recommended at Discharge Intermittent Supervision/Assistance  Equipment Recommendations  None recommended by OT    Recommendations for Other Services      Precautions / Restrictions Precautions Precautions: Fall Precaution Comments: bil feet ulcers Required Braces or Orthoses: Other Brace Other Brace: L foot PRAFO with ambulation Restrictions LLE Weight Bearing: Weight bearing as tolerated       Mobility Bed Mobility               General bed mobility comments: pt received in chair    Transfers Overall transfer level: Needs assistance Equipment used: Rolling walker (2 wheels) Transfers: Sit to/from Stand Sit to Stand: Min guard           General transfer comment: cues for hand placement, no physical assist from [facility] or recliner     Balance  Overall balance assessment: Needs assistance Sitting-balance support: No upper extremity supported;Feet supported Sitting balance-Leahy Scale: Good     Standing balance support: Bilateral upper extremity supported Standing balance-Leahy Scale: Poor Standing balance comment: can stand statically for pericare and at sink                           ADL either performed or assessed with clinical judgement   ADL Overall ADL's : Needs assistance/impaired     Grooming: Wash/dry hands;Oral care;Wash/dry face;Standing;Min guard           Upper Body Dressing : Set up;Sitting   Lower Body Dressing: Total assistance Lower Body Dressing Details (indicate cue type and reason): PRAFO Toilet Transfer: Min guard;Ambulation;Rolling walker (2 wheels)   Toileting- Clothing Manipulation and Hygiene: Min guard;Sit to/from stand       Functional mobility during ADLs: Min guard;Rolling walker (2 wheels)      Extremity/Trunk Assessment              Vision       Perception     Praxis      Cognition Arousal/Alertness: Awake/alert Behavior During Therapy: Flat affect Overall Cognitive Status: History of cognitive impairments - at baseline                                 General Comments: Pt quiet today, stated she has a lot on her mind with dr stating she needs her L foot amputated. Talked with pt at length about the rehabilitation aspect and  progression after amputation and benefits of not having any further foot infection. Pt stating she was still thinking about it.          Exercises     Shoulder Instructions       General Comments      Pertinent Vitals/ Pain       Pain Assessment: No/denies pain  Home Living                                          Prior Functioning/Environment              Frequency  Min 2X/week        Progress Toward Goals  OT Goals(current goals can now be found in the care plan section)   Progress towards OT goals: Progressing toward goals  Acute Rehab OT Goals OT Goal Formulation: With patient Time For Goal Achievement: 11/08/21 Potential to Achieve Goals: Good  Plan Discharge plan remains appropriate    Co-evaluation                 AM-PAC OT "6 Clicks" Daily Activity     Outcome Measure   Help from another person eating meals?: A Little Help from another person taking care of personal grooming?: A Little Help from another person toileting, which includes using toliet, bedpan, or urinal?: A Little Help from another person bathing (including washing, rinsing, drying)?: A Lot Help from another person to put on and taking off regular upper body clothing?: A Little Help from another person to put on and taking off regular lower body clothing?: A Lot 6 Click Score: 16    End of Session Equipment Utilized During Treatment: Rolling walker (2 wheels);Gait belt;Oxygen  OT Visit Diagnosis: Unsteadiness on feet (R26.81)   Activity Tolerance Patient tolerated treatment well   Patient Left in chair;with call bell/phone within reach;with chair alarm set   Nurse Communication          Time: 6222-9798 OT Time Calculation (min): 18 min  Charges: OT General Charges $OT Visit: 1 Visit OT Treatments $Self Care/Home Management : 8-22 mins  Nestor Lewandowsky, OTR/L Acute Rehabilitation Services Pager: [phone] Office: [phone]   Malka So 10/30/2021, 1:42 PM

## 2021-10-30 NOTE — Plan of Care (Signed)
  Problem: Activity: Goal: Ability to tolerate increased activity will improve Outcome: Progressing   Problem: Activity: Goal: Will verbalize the importance of balancing activity with adequate rest periods Outcome: Progressing   Problem: Respiratory: Goal: Ability to maintain a clear airway will improve Outcome: Progressing

## 2021-10-31 LAB — GLUCOSE, CAPILLARY
Glucose-Capillary: 114 mg/dL — ABNORMAL HIGH (ref 70–99)
Glucose-Capillary: 140 mg/dL — ABNORMAL HIGH (ref 70–99)
Glucose-Capillary: 205 mg/dL — ABNORMAL HIGH (ref 70–99)
Glucose-Capillary: 167 mg/dL — ABNORMAL HIGH (ref 70–99)

## 2021-10-31 LAB — CBC WITH DIFFERENTIAL/PLATELET
Abs Immature Granulocytes: 0.03 10*3/uL (ref 0.00–0.07)
Basophils Absolute: 0.1 10*3/uL (ref 0.0–0.1)
Basophils Relative: 1 %
Eosinophils Absolute: 0.9 10*3/uL — ABNORMAL HIGH (ref 0.0–0.5)
Eosinophils Relative: 11 %
HCT: 23.9 % — ABNORMAL LOW (ref 36.0–46.0)
Hemoglobin: 7.2 g/dL — ABNORMAL LOW (ref 12.0–15.0)
Immature Granulocytes: 0 %
Lymphocytes Relative: 24 %
Lymphs Abs: 2.1 10*3/uL (ref 0.7–4.0)
MCH: 28 pg (ref 26.0–34.0)
MCHC: 30.1 g/dL (ref 30.0–36.0)
MCV: 93 fL (ref 80.0–100.0)
Monocytes Absolute: 0.9 10*3/uL (ref 0.1–1.0)
Monocytes Relative: 10 %
Neutro Abs: 4.7 10*3/uL (ref 1.7–7.7)
Neutrophils Relative %: 54 %
Platelets: 254 10*3/uL (ref 150–400)
RBC: 2.57 MIL/uL — ABNORMAL LOW (ref 3.87–5.11)
RDW: 14.7 % (ref 11.5–15.5)
WBC: 8.7 10*3/uL (ref 4.0–10.5)
nRBC: 0 % (ref 0.0–0.2)

## 2021-10-31 LAB — BASIC METABOLIC PANEL
Anion gap: 9 (ref 5–15)
BUN: 62 mg/dL — ABNORMAL HIGH (ref 8–23)
CO2: 26 mmol/L (ref 22–32)
Calcium: 9.1 mg/dL (ref 8.9–10.3)
Chloride: 99 mmol/L (ref 98–111)
Creatinine, Ser: 3.45 mg/dL — ABNORMAL HIGH (ref 0.44–1.00)
GFR, Estimated: 15 mL/min — ABNORMAL LOW (ref >60)
Glucose, Bld: 127 mg/dL — ABNORMAL HIGH (ref 70–99)
Potassium: 4.8 mmol/L (ref 3.5–5.1)
Sodium: 134 mmol/L — ABNORMAL LOW (ref 135–145)

## 2021-10-31 MED ORDER — FUROSEMIDE 40 MG PO TABS
40.0000 mg | ORAL_TABLET | Freq: Two times a day (BID) | ORAL | Status: DC
  Administered 2021-10-31 – 2021-11-01 (×3): 40 mg via ORAL
  Filled 2021-10-31 (×3): qty 1

## 2021-10-31 NOTE — Progress Notes (Signed)
Subjective: I seen and evaluated Nancy [name] at bedside. She states she is feeling better. She is improving. She was sating on RA. She denies any complaints at this time. We had an extensive discussion regarding left foot amputation. She express that she is leaning toward the decision to have the procedure done.  Objective:  Vital signs in last 24 hours: Vitals:   10/30/21 2027 10/31/21 0353 10/31/21 0835 10/31/21 1149  BP: 135/64 (!) 151/65 138/67 134/65  Pulse: 68 66 77 69  Resp: 17 18 20 16   Temp: 97.8 F (36.6 C) 98.4 F (36.9 C) 98 F (36.7 C) 98.2 F (36.8 C)  TempSrc: Oral Oral Oral Oral  SpO2: 100% 98% 100% 99%  Weight:  111 kg    Height:       Physical Exam Constitutional:      General: She is not in acute distress. HENT:     Head: Normocephalic and atraumatic.  Eyes:     General: Lids are normal.  Cardiovascular:     Rate and Rhythm: Normal rate and regular rhythm.     Heart sounds: Normal heart sounds.  Pulmonary:     Effort: Pulmonary effort is normal.     Breath sounds: Normal breath sounds and air entry. No rhonchi or rales.  Musculoskeletal:     Right lower leg: 1+ Pitting Edema present.     Left lower leg: 1+ Edema present.  Feet:     Comments: Bilateral foot are neatly wrapped in bandage. Prevalon boot on left foot Skin:    General: Skin is warm and dry.  Neurological:     General: No focal deficit present.     Mental Status: She is alert.  Psychiatric:        Behavior: Behavior is cooperative.     Assessment/Plan:  Principal Problem:   Diabetic ulcer of left foot ([place]) Active Problems:   Uncontrolled hypertension   Type 2 diabetes mellitus with stage 4 chronic kidney disease, with long-term current use of insulin (HCC)   CKD (chronic kidney disease), stage IV (HCC)   Gangrene of left foot (HCC)   Acute exacerbation of congestive heart failure ([place])   Peripheral artery disease (HCC)   Osteomyelitis (HCC)   Left foot  osteomyelitis PAD There was a concern for osteomyelitis of the right metatarsal due to presence of ulcer noted on exam. X ray of the right foot reveals soft tissue swelling without definitve bony erosive change; no discrete area of bony erosion is identified to suggest osteomyelitis. Patient is leaning towards agreeing to the left foot amputation. --Continue to explore thoughts on amputation and her goals of care --Continue wound care with santyl and daily dressing changes of the left and right foot --Prevalon boot on left foot --Continue ASA 81 mg daily --Monitor temperature and WBC  Acute on chronic diastolic heart failure Patient reports improvement in her breathing. Lungs sounds clear on ascultation. LEE has improved.  --IV Lasix 60 mg switched to lasix 40mg  BID PO --O2 supplementation PRN; wean off as appropriate --Coreg 18.75 mg twice daily --Daily weights, strict I's and O's --Daily BMP   Hypertension Hyperlipidemia Stable; normotensive --Amlodipine 10 mg daily --Carvedilol 18.75 twice daily --Lipitor 40 mg daily   Insulin-dependent type 2 diabetes Recent A1c of 6.3%; CBGs <140 --Semglee 15 units --SSI moderate   CKD stage IV GFR <20 Baseline creatinine ~3.0-3.2. Stable --PhosLo 3 times daily --Avoid nephrotoxic agents --Daily BMP   Anemia of chronic disease Iron deficiency anemia  Baseline hemoglobin ~9; hemoglobin continues to fluctuate around ~7. Patient shows no sign of active bleeding.  --Iron supplementation daily --Daily CBC   Tactile hallucination Consider reviewing this dosage and usage --Risperidone 3 mg nightly   Prior to Admission Living Arrangement: Anticipated Discharge Location: Barriers to Discharge: Dispo: Anticipated discharge in approximately 1-2 day(s).   Timothy Lasso, MD 10/31/2021, 12:07 PM Pager: [phone] After 5pm on weekdays and 1pm on weekends: On Call pager [phone]

## 2021-10-31 NOTE — Progress Notes (Signed)
Mobility Specialist Progress Note:   10/31/21 1551  Mobility  Activity Ambulated in hall  Level of Assistance Standby assist, set-up cues, supervision of patient - no hands on  Assistive Device Front wheel walker  Distance Ambulated (ft) 80 ft  Mobility Ambulated with assistance in hallway  Mobility Response Tolerated fair  Mobility performed by Mobility specialist  Bed Position Chair  $Mobility charge 1 Mobility   Pt received in chair willing to participate in mobility. No complaints of pain and asymptomatic. Pt returned to chair with call bell in reach and all needs met.   [facility] Public librarian Phone [phone] Secondary Phone [phone]

## 2021-10-31 NOTE — Progress Notes (Signed)
Occupational Therapy Treatment Patient Details Name: Nancy [name] MRN: [id number] DOB: 17-Nov-1961 Today's Date: 10/31/2021   History of present illness Pt is a 60 y.o. female who presented 10/24/21 with SOB and CHF exacerbation. PMH: hypertension, hyperlipidemia, bilateral carotid artery stenosis, CVA 2020, HFpEF EF 55 to 60%, CKD stage V, TIA, type 2 diabetes mellitus with diabetic neuropathy, history of gangrenous left heel ulcer, history of tactile hallucinations, mild CAD, anemia of chronic disease   OT comments  Patient seen by skilled OT to address self care tasks. Patient was able to get to eob without assistance and required min assist to power up from EOB.  Patient ambulated to sink and performed grooming and LB bathing standing and UB bathing and dressing seated. Patient is making good progress with OT treatment and will continue to be followed by acute OT.    Recommendations for follow up therapy are one component of a multi-disciplinary discharge planning process, led by the attending physician.  Recommendations may be updated based on patient status, additional functional criteria and insurance authorization.    Follow Up Recommendations  No OT follow up    Assistance Recommended at Discharge Intermittent Supervision/Assistance  Equipment Recommendations  None recommended by OT    Recommendations for Other Services      Precautions / Restrictions Precautions Precautions: Fall Precaution Comments: bil feet ulcers Required Braces or Orthoses: Other Brace Other Brace: L foot PRAFO with ambulation Restrictions Weight Bearing Restrictions: No       Mobility Bed Mobility Overal bed mobility: Modified Independent Bed Mobility: Supine to Sit     Supine to sit: Modified independent (Device/Increase time)     General bed mobility comments: Patient able to get to EOB from supine    Transfers Overall transfer level: Needs assistance Equipment used: Rolling walker  (2 wheels) Transfers: Sit to/from Stand Sit to Stand: Min assist   Step pivot transfers: Min assist       General transfer comment: patient required min assist to stand from all surfaces     Balance Overall balance assessment: Needs assistance Sitting-balance support: No upper extremity supported;Feet supported Sitting balance-Leahy Scale: Good Sitting balance - Comments: able to sit on eob unassisted   Standing balance support: Bilateral upper extremity supported Standing balance-Leahy Scale: Poor Standing balance comment: relied on sink for support when standing for self care                           ADL either performed or assessed with clinical judgement   ADL Overall ADL's : Needs assistance/impaired     Grooming: Wash/dry hands;Oral care;Wash/dry face;Standing;Min guard Grooming Details (indicate cue type and reason): stood at sink for grooming,reliant on sink for balance Upper Body Bathing: Sitting;Minimal assistance Upper Body Bathing Details (indicate cue type and reason): assist for back Lower Body Bathing: Moderate assistance;Sit to/from stand;Sitting/lateral leans Lower Body Bathing Details (indicate cue type and reason): patient was able to bathe perineal area while standing and required assistance to wash buttocks Upper Body Dressing : Set up;Sitting Upper Body Dressing Details (indicate cue type and reason): donned gown Lower Body Dressing: Total assistance Lower Body Dressing Details (indicate cue type and reason): PRAFO             Functional mobility during ADLs: Min guard;Rolling walker (2 wheels) General ADL Comments: patient required min assist to stand from EOB  and chair    Extremity/Trunk Assessment Upper Extremity Assessment Upper Extremity Assessment: Defer  to OT evaluation            Vision       Perception     Praxis      Cognition Arousal/Alertness: Awake/alert Behavior During Therapy: Flat affect Overall  Cognitive Status: History of cognitive impairments - at baseline Area of Impairment: Attention;Memory;Following commands;Safety/judgement;Awareness;Problem solving                   Current Attention Level: Sustained Memory: Decreased short-term memory Following Commands: Follows one step commands with increased time   Awareness: Emergent Problem Solving: Slow processing General Comments: responded to most questions with on word responses          Exercises     Shoulder Instructions       General Comments      Pertinent Vitals/ Pain       Pain Assessment: Faces Faces Pain Scale: Hurts a little bit Pain Location: heel Pain Descriptors / Indicators: Guarding Pain Intervention(s): Monitored during session  Home Living                                          Prior Functioning/Environment              Frequency  Min 2X/week        Progress Toward Goals  OT Goals(current goals can now be found in the care plan section)  Progress towards OT goals: Progressing toward goals  Acute Rehab OT Goals Patient Stated Goal: go home OT Goal Formulation: With patient Time For Goal Achievement: 11/08/21 Potential to Achieve Goals: Good ADL Goals Pt Will Perform Grooming: with modified independence;standing Pt Will Perform Upper Body Bathing: with set-up;sitting Pt Will Perform Upper Body Dressing: with set-up;sitting Pt Will Perform Lower Body Dressing: with modified independence;with adaptive equipment;sit to/from stand Pt Will Transfer to Toilet: with modified independence;stand pivot transfer;bedside commode Pt Will Perform Toileting - Clothing Manipulation and hygiene: with modified independence;sit to/from stand Pt/caregiver will Perform Home Exercise Program: Increased ROM;Increased strength;Both right and left upper extremity  Plan Discharge plan remains appropriate    Co-evaluation                 AM-PAC OT "6 Clicks" Daily  Activity     Outcome Measure   Help from another person eating meals?: A Little Help from another person taking care of personal grooming?: A Little Help from another person toileting, which includes using toliet, bedpan, or urinal?: A Little Help from another person bathing (including washing, rinsing, drying)?: A Lot Help from another person to put on and taking off regular upper body clothing?: A Little Help from another person to put on and taking off regular lower body clothing?: A Lot 6 Click Score: 16    End of Session Equipment Utilized During Treatment: Gait belt;Rolling walker (2 wheels)  OT Visit Diagnosis: Unsteadiness on feet (R26.81)   Activity Tolerance Patient tolerated treatment well   Patient Left in chair;with call bell/phone within reach;with chair alarm set   Nurse Communication Mobility status        Time: 0254-2706 OT Time Calculation (min): 26 min  Charges: OT General Charges $OT Visit: 1 Visit OT Treatments $Self Care/Home Management : 23-37 mins  Lodema Hong, [place]  Pager [phone] Office [place] 10/31/2021, 10:16 AM

## 2021-10-31 NOTE — Progress Notes (Signed)
[facility] for Infectious Disease  Date of Admission:  10/24/2021     Total days of antibiotics 0         ASSESSMENT:  Nancy [name] continues remain stable. We again discussed the recommendation for surgery as antibiotics are not likely to resolve this infection and it does place her at risk for developing further infection with potentially life threatening consequences. She is continuing to think about surgery at this time. Will continue to monitor off antibiotics and arrange for follow up in the ID clinic. ID will sign off. Please re-consult if needed.   PLAN:  Continue watchful waiting without antibiotics.  Continue wound care per Wound RN recommendations.  Follow up in ID clinic.  ID will sign off  Principal Problem:   Diabetic ulcer of left foot ([place]) Active Problems:   Uncontrolled hypertension   Type 2 diabetes mellitus with stage 4 chronic kidney disease, with long-term current use of insulin (HCC)   CKD (chronic kidney disease), stage IV (HCC)   Gangrene of left foot (HCC)   Acute exacerbation of congestive heart failure (HCC)   Peripheral artery disease (HCC)   Osteomyelitis (HCC)    amLODipine  10 mg Oral Daily   aspirin  81 mg Oral Daily   atorvastatin  40 mg Oral Daily   calcium acetate  667 mg Oral TID WC   carvedilol  18.75 mg Oral BID WC   collagenase   Topical Daily   enoxaparin (LOVENOX) injection  30 mg Subcutaneous Q24H   ferrous sulfate  325 mg Oral Q breakfast   furosemide  60 mg Intravenous Daily   insulin aspart  0-15 Units Subcutaneous TID WC   insulin glargine-yfgn  15 Units Subcutaneous QHS   risperiDONE  3 mg Oral QHS    SUBJECTIVE:  Afebrile overnight with no acute events. Feeling okay today with no new concerns/complaints.   Allergies  Allergen Reactions   Elemental Sulfur Hives and Other (See Comments)    PATIENT STATED THIS, MORE THAN LIKELY, SHOULD HAVE BEEN LOGGED AS "SULFA"   Hydralazine Hcl Other (See Comments)    Hair  loss   Hydrocodone Itching and Other (See Comments)    Upset stomach   Metformin And Related Nausea And Vomiting and Other (See Comments)    Stomach pains, also   Other Nausea Only and Other (See Comments)    Lettuce- Does not digest this!!   Plaquenil [Hydroxychloroquine Sulfate] Hives   Shellfish-Derived Products Nausea Only and Other (See Comments)    Caused an upset stomach   Shrimp (Diagnostic) Nausea Only and Other (See Comments)    Upset stomach    Sulfa Antibiotics Hives     Review of Systems: Review of Systems  Constitutional:  Negative for chills, fever and weight loss.  Respiratory:  Negative for cough, shortness of breath and wheezing.   Cardiovascular:  Negative for chest pain and leg swelling.  Gastrointestinal:  Negative for abdominal pain, constipation, diarrhea, nausea and vomiting.  Skin:  Negative for rash.     OBJECTIVE: Vitals:   10/30/21 2027 10/31/21 0353 10/31/21 0835 10/31/21 1149  BP: 135/64 (!) 151/65 138/67 134/65  Pulse: 68 66 77 69  Resp: 17 18 20 16   Temp: 97.8 F (36.6 C) 98.4 F (36.9 C) 98 F (36.7 C) 98.2 F (36.8 C)  TempSrc: Oral Oral Oral Oral  SpO2: 100% 98% 100% 99%  Weight:  111 kg    Height:  Body mass index is 43.35 kg/m.  Physical Exam Constitutional:      General: She is not in acute distress.    Appearance: She is well-developed.  Cardiovascular:     Rate and Rhythm: Normal rate and regular rhythm.     Heart sounds: Normal heart sounds.  Pulmonary:     Effort: Pulmonary effort is normal.     Breath sounds: Normal breath sounds.  Skin:    General: Skin is warm and dry.  Neurological:     Mental Status: She is alert and oriented to person, place, and time.  Psychiatric:        Mood and Affect: Mood normal.        Behavior: Behavior normal.    Lab Results Lab Results  Component Value Date   WBC 8.7 10/31/2021   HGB 7.2 (L) 10/31/2021   HCT 23.9 (L) 10/31/2021   MCV 93.0 10/31/2021   PLT 254  10/31/2021    Lab Results  Component Value Date   CREATININE 3.45 (H) 10/31/2021   BUN 62 (H) 10/31/2021   NA 134 (L) 10/31/2021   K 4.8 10/31/2021   CL 99 10/31/2021   CO2 26 10/31/2021    Lab Results  Component Value Date   ALT 9 10/26/2021   AST 9 (L) 10/26/2021   ALKPHOS 46 10/26/2021   BILITOT 1.0 10/26/2021     Microbiology: Recent Results (from the past 240 hour(s))  Resp Panel by RT-PCR (Flu A&B, Covid) Nasopharyngeal Swab     Status: None   Collection Time: 10/24/21  8:14 PM   Specimen: Nasopharyngeal Swab; Nasopharyngeal(NP) swabs in vial transport medium  Result Value Ref Range Status   SARS Coronavirus 2 by RT PCR NEGATIVE NEGATIVE Final    Comment: (NOTE) SARS-CoV-2 target nucleic acids are NOT DETECTED.  The SARS-CoV-2 RNA is generally detectable in upper respiratory specimens during the acute phase of infection. The lowest concentration of SARS-CoV-2 viral copies this assay can detect is 138 copies/mL. A negative result does not preclude SARS-Cov-2 infection and should not be used as the sole basis for treatment or other patient management decisions. A negative result may occur with  improper specimen collection/handling, submission of specimen other than nasopharyngeal swab, presence of viral mutation(s) within the areas targeted by this assay, and inadequate number of viral copies(<138 copies/mL). A negative result must be combined with clinical observations, patient history, and epidemiological information. The expected result is Negative.  Fact Sheet for Patients:  EntrepreneurPulse.com.au  Fact Sheet for Healthcare Providers:  IncredibleEmployment.be  This test is no t yet approved or cleared by the [place] FDA and  has been authorized for detection and/or diagnosis of SARS-CoV-2 by FDA under an Emergency Use Authorization (EUA). This EUA will remain  in effect (meaning this test can be used) for the  duration of the COVID-19 declaration under Section 564(b)(1) of the Act, 21 U.S.C.section 360bbb-3(b)(1), unless the authorization is terminated  or revoked sooner.       Influenza A by PCR NEGATIVE NEGATIVE Final   Influenza B by PCR NEGATIVE NEGATIVE Final    Comment: (NOTE) The Xpert Xpress SARS-CoV-2/FLU/RSV plus assay is intended as an aid in the diagnosis of influenza from Nasopharyngeal swab specimens and should not be used as a sole basis for treatment. Nasal washings and aspirates are unacceptable for Xpert Xpress SARS-CoV-2/FLU/RSV testing.  Fact Sheet for Patients: EntrepreneurPulse.com.au  Fact Sheet for Healthcare Providers: IncredibleEmployment.be  This test is not yet approved or cleared  by the [place] and has been authorized for detection and/or diagnosis of SARS-CoV-2 by FDA under an Emergency Use Authorization (EUA). This EUA will remain in effect (meaning this test can be used) for the duration of the COVID-19 declaration under Section 564(b)(1) of the Act, 21 U.S.C. section 360bbb-3(b)(1), unless the authorization is terminated or revoked.  Performed at [facility], [place] [address], [place], [place] [address]      Terri Piedra, [facility]  10/31/2021  1:18 PM

## 2021-11-01 ENCOUNTER — Other Ambulatory Visit: Payer: Self-pay

## 2021-11-01 DIAGNOSIS — N184 Chronic kidney disease, stage 4 (severe): Secondary | ICD-10-CM

## 2021-11-01 LAB — BASIC METABOLIC PANEL WITH GFR
Anion gap: 7 (ref 5–15)
BUN: 66 mg/dL — ABNORMAL HIGH (ref 8–23)
CO2: 26 mmol/L (ref 22–32)
Calcium: 8.8 mg/dL — ABNORMAL LOW (ref 8.9–10.3)
Chloride: 98 mmol/L (ref 98–111)
Creatinine, Ser: 3.43 mg/dL — ABNORMAL HIGH (ref 0.44–1.00)
GFR, Estimated: 15 mL/min — ABNORMAL LOW
Glucose, Bld: 136 mg/dL — ABNORMAL HIGH (ref 70–99)
Potassium: 4.8 mmol/L (ref 3.5–5.1)
Sodium: 131 mmol/L — ABNORMAL LOW (ref 135–145)

## 2021-11-01 LAB — CBC WITH DIFFERENTIAL/PLATELET
Abs Immature Granulocytes: 0.02 10*3/uL (ref 0.00–0.07)
Basophils Absolute: 0.1 10*3/uL (ref 0.0–0.1)
Basophils Relative: 1 %
Eosinophils Absolute: 0.8 10*3/uL — ABNORMAL HIGH (ref 0.0–0.5)
Eosinophils Relative: 10 %
HCT: 24.7 % — ABNORMAL LOW (ref 36.0–46.0)
Hemoglobin: 7.5 g/dL — ABNORMAL LOW (ref 12.0–15.0)
Immature Granulocytes: 0 %
Lymphocytes Relative: 25 %
Lymphs Abs: 2.1 10*3/uL (ref 0.7–4.0)
MCH: 28.3 pg (ref 26.0–34.0)
MCHC: 30.4 g/dL (ref 30.0–36.0)
MCV: 93.2 fL (ref 80.0–100.0)
Monocytes Absolute: 0.8 10*3/uL (ref 0.1–1.0)
Monocytes Relative: 10 %
Neutro Abs: 4.5 10*3/uL (ref 1.7–7.7)
Neutrophils Relative %: 54 %
Platelets: 269 10*3/uL (ref 150–400)
RBC: 2.65 MIL/uL — ABNORMAL LOW (ref 3.87–5.11)
RDW: 14.7 % (ref 11.5–15.5)
WBC: 8.3 10*3/uL (ref 4.0–10.5)
nRBC: 0 % (ref 0.0–0.2)

## 2021-11-01 LAB — GLUCOSE, CAPILLARY
Glucose-Capillary: 112 mg/dL — ABNORMAL HIGH (ref 70–99)
Glucose-Capillary: 144 mg/dL — ABNORMAL HIGH (ref 70–99)
Glucose-Capillary: 193 mg/dL — ABNORMAL HIGH (ref 70–99)

## 2021-11-01 NOTE — Plan of Care (Signed)
  Problem: Activity: Goal: Ability to tolerate increased activity will improve Outcome: Progressing Goal: Will verbalize the importance of balancing activity with adequate rest periods Outcome: Progressing   Problem: Respiratory: Goal: Ability to maintain a clear airway will improve Outcome: Progressing Goal: Levels of oxygenation will improve Outcome: Progressing Goal: Ability to maintain adequate ventilation will improve Outcome: Progressing   Problem: Health Behavior/Discharge Planning: Goal: Ability to manage health-related needs will improve Outcome: Progressing

## 2021-11-01 NOTE — Progress Notes (Signed)
Mobility Specialist Progress Note:   11/01/21 1047  Mobility  Activity Transferred:  Bed to chair  Level of Assistance Minimal assist, patient does 75% or more  Assistive Device Front wheel walker  Distance Ambulated (ft) 4 ft  Mobility Out of bed to chair with meals  Mobility Response Tolerated well  Mobility performed by Mobility specialist  Bed Position Chair  $Mobility charge 1 Mobility   [facility] Rajiv Parlato Mobility Specialist Primary Phone [phone] Secondary Phone [phone]

## 2021-11-01 NOTE — Discharge Instructions (Signed)
Please follow-up with your primary care doctor in 1 week.  Please continue to change your foot dressing daily  We advised follow up with the wound care clinic in 1 week.  Please try to avoid salty foods, too much salt will cause your breathing to worsen.  Please continue taking all of your medications daily.

## 2021-11-01 NOTE — TOC Transition Note (Signed)
Transition of Care [facility]) - CM/SW Discharge Note   Patient Details  Name: Nancy [name] MRN: [id number] Date of Birth: 16-Feb-1961  Transition of Care [facility]) CM/SW Contact:  Zenon Mayo, RN Phone Number: 11/01/2021, 3:33 PM   Clinical Narrative:    Patient is for dc today, she is active with [facility] for [place], NCM notified Bronwen Betters of dc today.  Patient states her spouse also helps to dress her wound for her.  Patient friend will be transporting her to the motel at dc today.  Patient states  she has all the DME she needs as well.    Final next level of care: [place] Barriers to Discharge: No Barriers Identified   Patient Goals and CMS Choice Patient states their goals for this hospitalization and ongoing recovery are:: return to motel with spouse CMS Medicare.gov Compare Post Acute Care list provided to:: Patient Choice offered to / list presented to : Patient  Discharge Placement                       Discharge Plan and Services                  DME Agency: NA       HH Arranged: RN, PT [facility] Agency: [facility] Date [place]: 11/01/21 Time [place]: 3662 Representative spoke with at [place]: [place] (SDOH) Interventions     Readmission Risk Interventions Readmission Risk Prevention Plan 09/21/2021 03/07/2020  Transportation Screening Complete Complete  PCP or Specialist Appt within 3-5 Days - Complete  HRI or [place] - (No Data)  Social Work Consult for [place] Planning/Counseling - Complete  Palliative Care Screening - Not Applicable  Medication Review Press photographer) Complete Complete  PCP or Specialist appointment within 3-5 days of discharge Complete -  [place] or Home Care Consult Complete -  SW Recovery Care/Counseling Consult Complete -  Palliative Care Screening Complete -  [place] Not Applicable -  Some recent  data might be hidden

## 2021-11-01 NOTE — Progress Notes (Incomplete)
Subjective: I seen and evaluated Nancy [name] at bedside.  She was sitting comfortably in bed.  She states that she is feeling fine.  She says that her breathing is well and she has not required any oxygen.  She states she is looking forward to working with PT today.  She states that yesterday she walked up and down the hospital hallway, she stated that her breathing was fine but she did feel tired afterwards.  She denies any complaints at this time.  She states that she will discuss with her husband regarding amputation of the left foot.  She states she is still thinking about it and has not made a decision yet.  Objective:  Vital signs in last 24 hours: Vitals:   10/31/21 1702 10/31/21 2013 11/01/21 0459 11/01/21 1019  BP: (!) 154/73 (!) 148/70 (!) 159/65 (!) 154/72  Pulse: 68 68 66 72  Resp: 16 18 18 16   Temp:  98.7 F (37.1 C) 98.2 F (36.8 C) 98.2 F (36.8 C)  TempSrc:  Oral Oral Oral  SpO2:  99% 100% 100%  Weight:   111.7 kg   Height:       Physical Exam Constitutional:      General: She is not in acute distress. HENT:     Head: Normocephalic and atraumatic.  Cardiovascular:     Rate and Rhythm: Normal rate and regular rhythm.     Heart sounds: Normal heart sounds.  Pulmonary:     Effort: Pulmonary effort is normal.     Breath sounds: Normal breath sounds.  Abdominal:     General: Abdomen is protuberant.  Musculoskeletal:     Right lower leg: 1+ Pitting Edema present.     Left lower leg: 1+ Edema present.  Feet:     Comments: Bilateral feet covered in prevalon boot Skin:    General: Skin is warm and dry.  Neurological:     General: No focal deficit present.     Mental Status: She is alert.  Psychiatric:        Behavior: Behavior normal. Behavior is cooperative.     Assessment/Plan:  Principal Problem:   Diabetic ulcer of left foot ([place]) Active Problems:   Uncontrolled hypertension   Type 2 diabetes mellitus with stage 4 chronic kidney disease, with  long-term current use of insulin (HCC)   CKD (chronic kidney disease), stage IV (HCC)   Gangrene of left foot (HCC)   Acute exacerbation of congestive heart failure ([place])   Peripheral artery disease (HCC)   Osteomyelitis (HCC)  Left foot osteomyelitis PAD There was a concern for osteomyelitis of the right metatarsal due to presence of ulcer noted on exam. X ray of the right foot reveals soft tissue swelling without definitve bony erosive change; no discrete area of bony erosion is identified to suggest osteomyelitis. Patient is leaning towards agreeing to the left foot amputation. --Continue to explore thoughts on amputation and her goals of care --Continue wound care with santyl and daily dressing changes of the left and right foot --Prevalon boot on left foot --Continue ASA 81 mg daily --Monitor temperature and WBC   Acute on chronic diastolic heart failure Patient reports improvement in her breathing and has not required oxygen. Lungs sounds clear on ascultation. LEE has improved.  -- lasix 40mg  BID PO --O2 supplementation PRN; wean off as appropriate --Coreg 18.75 mg twice daily --Daily weights, strict I's and O's --Daily BMP   Hypertension Hyperlipidemia Patient has been hypertensive overnight with SBP  greater than 150.  This is new because her blood pressure has been appropriate throughout hospital course.  Could likely be elevated in the setting of stress from decision making regarding amputation of her left foot.  We will continue to monitor. --Amlodipine 10 mg daily --Carvedilol 18.75 twice daily --Lipitor 40 mg daily   Insulin-dependent type 2 diabetes Recent A1c of 6.3%; CBGs <120 --Semglee 15 units --SSI moderate   CKD stage IV GFR <20 Baseline creatinine ~3.0-3.2. Stable --PhosLo 3 times daily --Avoid nephrotoxic agents --Daily BMP   Anemia of chronic disease Iron deficiency anemia Baseline hemoglobin ~9; hemoglobin continues to fluctuate around ~7. Patient  shows no sign of active bleeding.  --Iron supplementation daily --Daily CBC   Tactile hallucination Consider reviewing this dosage and usage --Risperidone 3 mg nightly        Prior to Admission Living Arrangement: Anticipated Discharge Location: Barriers to Discharge: Dispo: Anticipated discharge in approximately 1-2 day(s).   Timothy Lasso, MD 11/01/2021, 10:35 AM Pager: [phone] After 5pm on weekdays and 1pm on weekends: On Call pager [phone]

## 2021-11-01 NOTE — Discharge Summary (Addendum)
Name: Nancy [name] MRN: [id number] DOB: 12/27/1960 60 y.o. PCP: [name], MD  Date of Admission: 10/24/2021  5:20 PM Date of Discharge: 11/01/21 Attending Physician: Angelica Pou, MD  Discharge Diagnosis: 1.  Left foot osteomyelitis/PAD 2.  Acute on chronic diastolic heart failure 3.  CKD stage IV 4.  Hypertension/hyperlipidemia 5.  Anemia of chronic disease/iron deficiency anemia  Discharge Medications: Allergies as of 11/01/2021       Reactions   Elemental Sulfur Hives, Other (See Comments)   PATIENT STATED THIS, MORE THAN LIKELY, SHOULD HAVE BEEN LOGGED AS "SULFA"   Hydralazine Hcl Other (See Comments)   Hair loss   Hydrocodone Itching, Other (See Comments)   Upset stomach   Metformin And Related Nausea And Vomiting, Other (See Comments)   Stomach pains, also   Other Nausea Only, Other (See Comments)   Lettuce- Does not digest this!!   Plaquenil [hydroxychloroquine Sulfate] Hives   Shellfish-derived Products Nausea Only, Other (See Comments)   Caused an upset stomach   Shrimp (diagnostic) Nausea Only, Other (See Comments)   Upset stomach    Sulfa Antibiotics Hives        Medication List     TAKE these medications    acetaminophen 500 MG tablet Commonly known as: TYLENOL Take 500 mg by mouth every 6 (six) hours as needed for moderate pain or headache.   amLODipine 10 MG tablet Commonly known as: NORVASC Take 1 tablet (10 mg total) by mouth daily.   Aspirin Low Dose 81 MG EC tablet Generic drug: aspirin Take 1 tablet (81 mg total) by mouth daily.   atorvastatin 40 MG tablet Commonly known as: LIPITOR Take 1 tablet (40 mg total) by mouth daily.   calcium acetate 667 MG capsule Commonly known as: PHOSLO Take 1 capsule (667 mg total) by mouth 3 (three) times daily with meals.   carvedilol 12.5 MG tablet Commonly known as: COREG Take 1.5 tablets (18.75 mg total) by mouth 2 (two) times daily with a meal.   FeroSul 325 (65 FE)  MG tablet Generic drug: ferrous sulfate Take 1 tablet (325 mg total) by mouth daily with breakfast.   glucose blood test strip Use as instructedCheck blood sugar three times daily. E11.40   insulin lispro 100 UNIT/ML KwikPen Commonly known as: HumaLOG KwikPen Inject 4 units before breakfast and dinner. What changed:  how much to take how to take this when to take this additional instructions   Lantus SoloStar 100 UNIT/ML Solostar Pen Generic drug: insulin glargine Inject 30 Units into the skin at bedtime.   OneTouch Verio w/Device Kit Check blood sugar three times daily. E11.40   Pentips 32G X 4 MM Misc Generic drug: Insulin Pen Needle Use to inject insulin as directed.   risperiDONE 3 MG tablet Commonly known as: RISPERDAL TAKE 1 TABLET (3 MG TOTAL) BY MOUTH AT BEDTIME. What changed:  how much to take how to take this when to take this   torsemide 20 MG tablet Commonly known as: DEMADEX Take 3 tablets (60 mg total) by mouth daily.               Discharge Care Instructions  (From admission, onward)           Start     Ordered   11/01/21 0000  Discharge wound care:       Comments: Paint heel and lateral foot wound with betadine daily, top with DRY dressings. Secure with kerlix. Change daily  Off  load heel with Prevalon boot   11/01/21 1406            Disposition and follow-up:   Ms.Lively H Ognibene was discharged from [facility] in Good condition.  At the hospital follow up visit please address:  Please follow-up with your primary care doctor; upcoming appointment December 04, 2021  1.  Left foot osteomyelitis/PAD-follow-up with wound care clinic 2.  Acute on chronic diastolic heart failure-continue to take your home medications Coreg 18.75 mg twice daily and Lasix 40 mg daily 3.  CKD stage IV-continue taking PhosLo 3 times daily; avoid NSAIDs 4.  Hypertension/hyperlipidemia-continue taking amlodipine 10 mg daily;  carvedilol 18.5 twice daily; Lipitor 40 mg daily 5.  Anemia of chronic disease/iron deficiency anemia-continue taking daily iron supplements.   2.  Labs / imaging needed at time of follow-up: BMP  3.  Pending labs/ test needing follow-up: None  Follow-up Appointments:  Follow-up Information     Laurice Record, MD Follow up.   Specialty: Internal Medicine Why: 12/19 at 10am. Please call to reschedule if you are unable to make this appointment. Contact information: [address] [place] [address] [phone]         [facility]              Follow up in 1 week(s).   Contact information: [address] [address] [place] Hospital Course by problem list: 1. 1.  Left foot osteomyelitis/PAD-patient presented with chronic osteomyelitis of the left foot.  X-ray of the foot confirmed osteomyelitis.  MRI was contraindicated due to poor kidney function.  Patient is aware of the infection of her left foot and on previous hospitalizations was advised for amputation.  Infectious disease was consulted and confirmed that antibiotic treatment is not curative.  Orthopedic surgeon was consulted and recommended amputation for infection source control.  Bedside debridement was performed 10/26/2021; necrotic tissue was removed. During hospital course, wound care addressed the the wound with daily dressing changes and Santyl. Patient again declined amputation on this admission.  Patient was also noted to have a soft tissue ulcer on the right fifth metatarsal.  X-ray imaging of the right excluded osteomyelitis.  Wound care addressed the right foot with daily dressing and Santyl.  Prevalon boots worn throughout hospital course to offload pressure of the feet. 2.  Acute on chronic diastolic heart failure-patient presented to the ED with complaint of shortness of breath, orthopnea and lower  extremity swelling.  Initial labs reveal BNP of 507.  Patient received 80 mg IV Lasix in the ED.  Patient was admitted for further treatment.  Patient received IV 60 mg Lasix daily.  Patient was diuresed over 3 L throughout hospital course.  Patient was then switched to oral Lasix 40 mg twice daily.  Patient required O2 nasal cannula 4 L for oxygen supplementation.  Upon discharge patient was satting 100% on room air.  Lower extremity edema currently at baseline.  Bilateral lung fields clear on auscultation.  Patient reports shortness of breath and is breathing at baseline.  PT recommends home health physical therapy.  No occupational therapy needed. 3.  CKD stage IV-patient's creatinine baseline ~3; patient's creatinine function remained at baseline throughout hospital course.  Nephrotoxic agents were avoided. 4.  Hypertension/hyperlipidemia-on admission patient was hypertensive SBP >150.  Patient was restarted on home medications:  Amlodipine 10 mg, carvedilol 18.75 mg twice daily and blood pressure normalized.  Patient was restarted on Lipitor 40 mg daily, patient tolerated well. 5.  Anemia of chronic disease/iron deficiency anemia-patient's baseline hemoglobin ~9.  Patient's hemoglobin trended down to 7 throughout hospital course.  Patient shows no sign of active bleeding.  Patient was continued on iron supplementation daily.   Discharge Exam:   BP (!) 154/72 (BP Location: Left Arm)   Pulse 72   Temp 98.2 F (36.8 C) (Oral)   Resp 16   Ht $R'5\' 3"'Sg$  (1.6 m)   Wt 111.7 kg   SpO2 100%   BMI 43.62 kg/m  Discharge exam: Physical Exam Constitutional:      General: She is awake. She is not in acute distress. HENT:     Head: Normocephalic and atraumatic.  Eyes:     General: Lids are normal.  Cardiovascular:     Rate and Rhythm: Normal rate and regular rhythm.     Heart sounds: Normal heart sounds.     Comments: LEE at baseline Pulmonary:     Effort: Pulmonary effort is normal.     Breath sounds:  Normal breath sounds and air entry.  Abdominal:     General: Abdomen is protuberant. Bowel sounds are normal.  Musculoskeletal:     Right lower leg: 1+ Pitting Edema present.     Left lower leg: 1+ Edema present.  Skin:    General: Skin is warm and dry.  Neurological:     General: No focal deficit present.     Mental Status: She is alert and oriented to person, place, and time.  Psychiatric:        Attention and Perception: Attention normal.        Behavior: Behavior normal. Behavior is cooperative.     Pertinent Labs, Studies, and Procedures:  CBC Latest Ref Rng & Units 11/01/2021 10/31/2021 10/30/2021  WBC 4.0 - 10.5 K/uL 8.3 8.7 8.0  Hemoglobin 12.0 - 15.0 g/dL 7.5(L) 7.2(L) 7.4(L)  Hematocrit 36.0 - 46.0 % 24.7(L) 23.9(L) 24.5(L)  Platelets 150 - 400 K/uL 269 254 245   CMP Latest Ref Rng & Units 11/01/2021 10/31/2021 10/30/2021  Glucose 70 - 99 mg/dL 136(H) 127(H) 111(H)  BUN 8 - 23 mg/dL 66(H) 62(H) 53(H)  Creatinine 0.44 - 1.00 mg/dL 3.43(H) 3.45(H) 3.31(H)  Sodium 135 - 145 mmol/L 131(L) 134(L) 135  Potassium 3.5 - 5.1 mmol/L 4.8 4.8 4.5  Chloride 98 - 111 mmol/L 98 99 101  CO2 22 - 32 mmol/L $RemoveB'26 26 26  'krsaOwIp$ Calcium 8.9 - 10.3 mg/dL 8.8(L) 9.1 9.1  Total Protein 6.5 - 8.1 g/dL - - -  Total Bilirubin 0.3 - 1.2 mg/dL - - -  Alkaline Phos 38 - 126 U/L - - -  AST 15 - 41 U/L - - -  ALT 0 - 44 U/L - - -   BMP Latest Ref Rng & Units 11/01/2021 10/31/2021 10/30/2021  Glucose 70 - 99 mg/dL 136(H) 127(H) 111(H)  BUN 8 - 23 mg/dL 66(H) 62(H) 53(H)  Creatinine 0.44 - 1.00 mg/dL 3.43(H) 3.45(H) 3.31(H)  BUN/Creat Ratio 12 - 28 - - -  Sodium 135 - 145 mmol/L 131(L) 134(L) 135  Potassium 3.5 - 5.1 mmol/L 4.8 4.8 4.5  Chloride 98 - 111 mmol/L 98 99 101  CO2 22 - 32 mmol/L $RemoveB'26 26 26  'kbFNrUZO$ Calcium 8.9 - 10.3 mg/dL 8.8(L) 9.1 9.1   BNP (last 3 results) Recent Labs    09/26/21 1124 10/05/21 2223  10/24/21 1728  BNP 548.5* 545.6* 507.3*   PORTABLE CHEST 1 VIEW   COMPARISON:   10/09/2021   FINDINGS: 1746 hours. Low volumes. Vascular congestion noted with patchy/nodular bilateral lung opacities. Probable tiny effusions. The visualized bony structures of the thorax show no acute abnormality. Telemetry leads overlie the chest.   IMPRESSION: Low volume film with vascular congestion and patchy/nodular bilateral lung opacities. Imaging features likely reflect pulmonary edema although infection not excluded.   LEFT FOOT - COMPLETE 3+ VIEW   COMPARISON:  None.   FINDINGS: Large defect in the soft tissues overlying the heel consistent with soft tissue ulceration. There is some focal rare fraction of the underlying calcaneal tuberosity concerning for the presence of osteomyelitis. The remaining visualized bones and joints are unremarkable.   IMPRESSION: Focal rarefaction of the calcaneal tuberosity underlying the large soft tissue ulceration concerning for osteomyelitis.    RIGHT FOOT COMPLETE - 3+ VIEW   COMPARISON:  10/07/2018   FINDINGS: Considerable soft tissue swelling is noted particularly in the region of the metatarsals. No discrete area of bony erosion is identified to suggest osteomyelitis. No acute fracture or dislocation is noted.   IMPRESSION: Soft tissue swelling without definitive bony erosive change.    Discharge Instructions: Discharge Instructions     Call MD for:  difficulty breathing, headache or visual disturbances   Complete by: As directed    Call MD for:  extreme fatigue   Complete by: As directed    Call MD for:  hives   Complete by: As directed    Call MD for:  persistant dizziness or light-headedness   Complete by: As directed    Call MD for:  persistant nausea and vomiting   Complete by: As directed    Call MD for:  redness, tenderness, or signs of infection (pain, swelling, redness, odor or green/yellow discharge around incision site)   Complete by: As directed    Call MD for:  severe uncontrolled pain   Complete  by: As directed    Call MD for:  temperature >100.4   Complete by: As directed    Diet - low sodium heart healthy   Complete by: As directed    Discharge instructions   Complete by: As directed    Paint heel and lateral foot wound with betadine daily, top with DRY dressings. Secure with kerlix. Change daily  Off load heel with Prevalon boot   Discharge wound care:   Complete by: As directed    Paint heel and lateral foot wound with betadine daily, top with DRY dressings. Secure with kerlix. Change daily  Off load heel with Prevalon boot   Increase activity slowly   Complete by: As directed        Signed: Timothy Lasso, MD 11/01/2021, 2:12 PM   Pager: [phone]

## 2021-11-02 ENCOUNTER — Telehealth: Payer: Self-pay

## 2021-11-02 ENCOUNTER — Telehealth (HOSPITAL_COMMUNITY): Payer: Self-pay

## 2021-11-02 ENCOUNTER — Encounter (HOSPITAL_BASED_OUTPATIENT_CLINIC_OR_DEPARTMENT_OTHER): Payer: Medicaid Other | Admitting: Internal Medicine

## 2021-11-02 NOTE — Progress Notes (Addendum)
Advanced Heart Failure Clinic Note   PCP: [name], MD PCP-Cardiologist: Quay Burow, MD  HF Cardiologist: Dr. Aundra Dubin  HPI: Nancy [name] 60 y.o. AAF w/ chronic diastolic heart failure, Stage IV CKD (baseline SCr ~2-3), HTN, T2DM, HLD, CVA, PVD (carotid artery duplex 3/22: occluded Rt ICA, mod Lt ICA stenosis) and tobacco abuse.   Echo 11/2019: EF 55-60%, RV normal. No LVH. No significant valvular dysfunction   She was referred to Dr. Gwenlyn Found for [facility] by PCP in April 2022. Complained of progressive exertional dyspnea, LEE and orthopnea. Echo 4/22: EF 60-65%, GIIDD, mild LVH, RV normal. NST w/ findings suggestive of possible anterior ischemia vs breast attenuation. R/LHC on 04/27/21 showed mild nonobstructive CAD and RHC hemodynamics c/w pulmonary HTN (see cath data below). Diuretics were increased w/ plans to refer to the [facility]. Dry wt felt to be ~228 lb.   Unfortunately, she has had 3 consecutive re-admissions for a/c dCHF since that time.   Admitted 5/24-5/27=> discharged home on Lasix 40 daily.   Admitted 6/25-6/30 =>SCr bump to 3.2 (2.9 at d/c). Discharged home on Torsemide 40 daily. Echo EF 55-60%, RV normal.   Admitted 07/07/21= > direct admit from Dr. Kennon Holter office w/ marked fluid overload, progressive wt gain, exertional dyspnea, LEE and orthopnea. Clinic BP elevated 172/85. SCr 2.6 on admit. Started on lasix gtt. AHF team consulted to assist with further management. She underwent RHC, suggests ongoing volume overload with elevated PCWP and RA.  Prominent v-waves in PCWP but think due to severe diastolic dysfunction as there is no significant MR on review of recent echo.  PYP study was not suggestive of TTR cardiac amyloidosis.  Suspect severe diastolic dysfunction due to long-standing and poorly controlled HTN. SCr and potassium continued to climb and nephrology consulted. Her diuretics were adjusted and she was able to be discharged on low dose carvedilol and torsemide 40  daily; weight 226 lbs.  Post hospital follow up 08/04/21 she was volume overloaded in the setting of dietary indiscretion. Torsemide increased to 80 mg x 3 days and unna boots ordered.   Seen in ED 08/13/21 with leg swelling. Given IV lasix with good response. Home torsemide was doubled and she was discharged.  Admitted 09/04/21 with a left heel ulcer secondary to diabetic neuropathy.  Work-up included right ABI of 0.45 and local ABI of 0.5.  She was started on antibiotics, no osteomyelitis on MRI. Vascular consulted and did not pursue angiography as it could potentially push her into renal failure and require initiation of HD. Ortho recommended left transtibial amputation  but she declined. Palliative care met with her but she did not want to discuss her health issues. There was no indication for HD, per nephrology, but she will likely need AV fistula and HD soon. She was not interested in this. PT recommended SNF however patient declined. She was discharged with Home Health (she is Medicaid pending, no insurance).   Recently readmitted 10/22 w/ acute on chronic HFpEF exacerbation w/ acute pulmonary edema in setting of hypertensive urgency. Acute exacerbation felt triggered by poor med compliance and dietary indiscretion w/ sodium + high fluid intake. Echo EF 50-55%, mod LVH, G2DD. Course c/b worsening CKD. SCr bumped to 3.12 (prior baseline ~ 2.5). Nephrology was consulted but felt no emergent indication for HD. She responded well to IV Lasix, though nephrology anticipates that she may bee nearing need for HD soon. Plan is to follow in the outpatient setting. Also noted to have gangrenous pressure ulcers of bilateral  heels left and right dorsal feet. Now followed by wound care. Discharged home on torsemide 60 mg daily. D/c SCr 3.12. D/c wt 231 lb.   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 She was diuresed with IV lasix and transitioned to po. GDMT limited by CKD IV.  Ortho surgery again recommended amputation, she continued to decline. She was also noted to have wound on right metatarsal, XR showed no osteomyelitis. She received bedside debridement and wound care to left foot. She was discharged home with plans for on-going wound care, weight 245 lbs.  Today she returns for post hospitalization HF follow up with her husband. She is unable to bear weight on left leg but can get around the house some with the use of her cane. She is occasionally SOB with this, main issue continues to be fatigue. Denies  CP, dizziness, edema, or PND/Orthopnea. Appetite ok. She is drinking a lot of fluids. No fever or chills. She is not weighing consistently at home. Taking all medications. Her husband is doing her dressing changes to left foot daily. She is followed by Paramedicine.  ReDs: 44%  ECG (personally reviewed):  SR  ROS: All systems reviewed and negative except as per HPI.   Past Medical History:  Diagnosis Date   Anemia 2006   Depression 2014   previously on amitryptiline    Diabetes mellitus with neurological manifestation ([place]) 2006   Diabetic peripheral neuropathy ([place]) 04/26/2020   Fracture of left ankle 1997    Gastroparesis 07/2016   HOH (hard of hearing) 2004    Hyperlipidemia 2006   Hypertension 2006   IBS (irritable bowel syndrome) 2002    Leukopenia 2015    Macular degeneration 11/2019   Shingles 2009    Stroke [facility]) 2020   Thyroid nodule 2004   Current Outpatient Medications  Medication Sig Dispense Refill   acetaminophen (TYLENOL) 500 MG tablet Take 500 mg by mouth every 6 (six) hours as needed for moderate pain or headache.     amLODipine (NORVASC) 10 MG tablet Take 1 tablet (10 mg total) by mouth daily. 30 tablet 3   aspirin 81 MG EC tablet Take 1 tablet (81 mg total) by mouth daily. 60 tablet 1   atorvastatin (LIPITOR) 40 MG tablet Take 1 tablet (40 mg total) by mouth daily. 30 tablet 4   Blood Glucose Monitoring Suppl (ONETOUCH VERIO)  w/Device KIT Check blood sugar three times daily. E11.40 1 kit 0   calcium acetate (PHOSLO) 667 MG capsule Take 1 capsule (667 mg total) by mouth 3 (three) times daily with meals. 90 capsule 0   carvedilol (COREG) 12.5 MG tablet Take 1.5 tablets (18.75 mg total) by mouth 2 (two) times daily with a meal. 90 tablet 5   ferrous sulfate 325 (65 FE) MG tablet Take 1 tablet (325 mg total) by mouth daily with breakfast. 100 tablet 1   glucose blood test strip Use as instructedCheck blood sugar three times daily. E11.40 100 each 12   insulin glargine (LANTUS) 100 UNIT/ML Solostar Pen Inject 30 Units into the skin at bedtime. 15 mL 3   insulin lispro (HUMALOG KWIKPEN) 100 UNIT/ML KwikPen Inject 4 units before breakfast and dinner. (Patient taking differently: Inject 4 Units into the skin every evening.) 15 mL 3   Insulin Pen Needle 32G X 4 MM MISC Use to inject insulin as directed. 100 each 0   risperiDONE (RISPERDAL) 3 MG tablet TAKE 1 TABLET (3 MG TOTAL) BY MOUTH  AT BEDTIME. 30 tablet 2   torsemide (DEMADEX) 20 MG tablet Take 3 tablets (60 mg total) by mouth daily. 90 tablet 0   No current facility-administered medications for this encounter.   Allergies  Allergen Reactions   Elemental Sulfur Hives and Other (See Comments)    PATIENT STATED THIS, MORE THAN LIKELY, SHOULD HAVE BEEN LOGGED AS "SULFA"   Hydralazine Hcl Other (See Comments)    Hair loss   Hydrocodone Itching and Other (See Comments)    Upset stomach   Metformin And Related Nausea And Vomiting and Other (See Comments)    Stomach pains, also   Other Nausea Only and Other (See Comments)    Lettuce- Does not digest this!!   Plaquenil [Hydroxychloroquine Sulfate] Hives   Shellfish-Derived Products Nausea Only and Other (See Comments)    Caused an upset stomach   Shrimp (Diagnostic) Nausea Only and Other (See Comments)    Upset stomach    Sulfa Antibiotics Hives   Social History   Socioeconomic History   Marital status: Legally  Separated    Spouse name: [name]   Number of children: 1   Years of education: some colle   Highest education level: Not on file  Occupational History   Occupation: Employed FT as Landscape architect    Comment: [employer] , NA  Tobacco Use   Smoking status: Former    Packs/day: 0.25    Years: 0.50    Pack years: 0.13    Types: Cigarettes   Smokeless tobacco: Never   Tobacco comments:    Former smoker 11/03/21  Vaping Use   Vaping Use: Never used  Substance and Sexual Activity   Alcohol use: No   Drug use: No   Sexual activity: Yes    Birth control/protection: None  Other Topics Concern   Not on file  Social History Narrative   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    Social Determinants of Health   Financial Resource Strain: Low Risk    Difficulty of Paying Living Expenses: Not very hard  Food Insecurity: No Food Insecurity   Worried About Charity fundraiser in the Last Year: Never true   Ran Out of Food in the Last Year: Never true  Transportation Needs: No Transportation Needs   Lack of Transportation (Medical): No   Lack of Transportation (Non-Medical): No  Physical Activity: Not on file  Stress: Not on file  Social Connections: Not on file  Intimate Partner Violence: Not on file   Family History  Problem Relation Age of Onset   Hypertension Mother    Heart disease Mother    Diabetes Mother    Thyroid disease Mother    Congestive Heart Failure Mother    Breast cancer Maternal Grandmother    Colon cancer Maternal Grandfather    Heart attack Sister    Heart disease Brother    Hyperlipidemia Brother    Hypertension Brother    Diabetes Father    Breast cancer Maternal Aunt    BP 124/74   Pulse 74   Ht $R'5\' 3"'lR$  (1.6 m)   Wt 111.9 kg   SpO2 98%   BMI 43.72 kg/m   Wt Readings from Last 3 Encounters:  11/03/21 111.9 kg  11/01/21 111.7 kg  10/23/21 103.9 kg   General:  NAD. No resp difficulty, fatigued-appearing, arrived in  [facility] HEENT: Normal Neck: Supple. JVP to jaw Carotids 2+ bilat; no bruits. No lymphadenopathy or thryomegaly appreciated. Cor:  PMI nondisplaced. Regular rate & rhythm. No rubs, gallops or murmurs. Lungs: Diminished in bases. Abdomen: Obese, nontender, nondistended. No hepatosplenomegaly. No bruits or masses. Good bowel sounds. Extremities: No cyanosis, clubbing, rash, 2-3+ BLE edema to knees, R>L, left foot in boot. Right foot not visualized, but has ulcer per chart review. Neuro: Alert & oriented x 3, cranial nerves grossly intact. Moves all 4 extremities w/o difficulty. Flat affect.  ASSESSMENT & PLAN: 1. Acute on chronic diastolic CHF: Echo in 5/62 showed EF 55-60%, moderate LVH, normal RV, dilated IVC.  R/LHC in 5/22 showed nonobstructive CAD, pulmonary hypertension .  Multiple admissions recently with CHF, complicated by CKD stage IV.  RHC on 7/29 suggested ongoing volume overload with elevated PCWP and RA.  Prominent v-waves in PCWP but think due to severe diastolic dysfunction as there is no significant MR on review of recent echo.  PYP study was not suggestive of TTR cardiac amyloidosis.  Suspect severe diastolic dysfunction due to long-standing and poorly controlled HTN.  Recent admit for a/c CHF 10/22 & 11/22 w/ fluid overload, c/b CKD. NYHA III, functional class difficult due to LLE wound, but she endorses dyspnea with exertion and occasionally with minimal activity. She is volume overloaded today, ReDs 44%. GDMT limited by CKD IV. - Place compression hose on right leg/elevate as able. - Take metolazone 2.5 mg + 20 KCl today and additional torsemide 60 mg today. BMET/BNP today, repeat in 1 week. - Tomorrow, increase torsemide to 60 mg q AM and 40 mg q PM. - Discussed importance of daily weights, limiting fluid to less than 2L/day and limiting high-sodium foods.  - Continue with paramedicine. - Have had discussions regarding Cardi[facility]ems. Unfortunately, she is currently not a candidate as she  has no insurance. Suspect she is also likely nearing HD.  2. CAD: LHC on 5/22, she has mild to moderate segmental proximal mid and distal LAD disease. Her circumflex and RCA do not have significant disease. - Stable w/o CP. - Continue ASA + statin. 3. HLD: On atorvastatin 40. LDL 99, HDL 52 (7/22). 4. HTN: Well-controlled. No med adjustments today. Avoid aggressive BP lowing given CKD. - Continue carvedilol 18.75 mg bid. - Continue amlodipine 10 mg daily. 5. CKD stage V: Renal US with no evidence of obstruction. Followed by nephrology. Likely nearing HD soon. Will likely need AV fistula placement soon. She is followed by vascular surgery. 6. Carotid disease: Occluded R ICA, moderate L ICA - Continue ASA, statin. 7. PAD: Now with bilateral gangrenous pressure ulcers, L >R. Evaluated by vascular and orthopedic surgery during recent admissions, recommend transtibial amputation however patient declined this, opting for non-surgical healing.  - Followed by Wound Clinic. Needs ABIs. - Continue aspirin + statin. - Continue offloading heels.  Follow up with APP in 3 weeks (ReDs and BMET) and with Dr. Aundra Dubin in  2 months as scheduled  Allena Katz, [facility] 11/03/21

## 2021-11-02 NOTE — Telephone Encounter (Signed)
Spoke to Nancy [name] who reports she is home, discharged from hospital. She states she is feeling much better. She was reminded of appointment with HF clinic tomorrow. I plan to see her Monday in the home. She agreed. Call complete.

## 2021-11-02 NOTE — Telephone Encounter (Signed)
Transition Care Management Unsuccessful Follow-up Telephone Call  Date of discharge and from where:  [facility] on 11/01/2021  Attempts:  1st Attempt  Reason for unsuccessful TCM follow-up call:  Left voice message unable to reach pt at this time. Contact nr and info provided.

## 2021-11-03 ENCOUNTER — Telehealth (HOSPITAL_COMMUNITY): Payer: Self-pay | Admitting: Vascular Surgery

## 2021-11-03 ENCOUNTER — Encounter (HOSPITAL_COMMUNITY): Payer: Self-pay

## 2021-11-03 ENCOUNTER — Ambulatory Visit (HOSPITAL_COMMUNITY)
Admission: RE | Admit: 2021-11-03 | Discharge: 2021-11-03 | Disposition: A | Discharge status: Home / Self Care | Payer: Medicaid Other | Source: Ambulatory Visit | Attending: Family Medicine | Admitting: Family Medicine

## 2021-11-03 ENCOUNTER — Telehealth: Payer: Self-pay

## 2021-11-03 ENCOUNTER — Other Ambulatory Visit: Payer: Self-pay

## 2021-11-03 ENCOUNTER — Other Ambulatory Visit (HOSPITAL_COMMUNITY): Payer: Self-pay

## 2021-11-03 ENCOUNTER — Other Ambulatory Visit: Payer: Self-pay | Admitting: Internal Medicine

## 2021-11-03 ENCOUNTER — Other Ambulatory Visit (HOSPITAL_BASED_OUTPATIENT_CLINIC_OR_DEPARTMENT_OTHER): Payer: Self-pay

## 2021-11-03 VITALS — BP 124/74 | HR 74 | Ht 63.0 in | Wt 246.8 lb

## 2021-11-03 DIAGNOSIS — E1122 Type 2 diabetes mellitus with diabetic chronic kidney disease: Secondary | ICD-10-CM | POA: Insufficient documentation

## 2021-11-03 DIAGNOSIS — Z597 Insufficient social insurance and welfare support: Secondary | ICD-10-CM | POA: Insufficient documentation

## 2021-11-03 DIAGNOSIS — E785 Hyperlipidemia, unspecified: Secondary | ICD-10-CM | POA: Insufficient documentation

## 2021-11-03 DIAGNOSIS — I10 Essential (primary) hypertension: Secondary | ICD-10-CM

## 2021-11-03 DIAGNOSIS — N184 Chronic kidney disease, stage 4 (severe): Secondary | ICD-10-CM | POA: Insufficient documentation

## 2021-11-03 DIAGNOSIS — Z7982 Long term (current) use of aspirin: Secondary | ICD-10-CM | POA: Insufficient documentation

## 2021-11-03 DIAGNOSIS — I272 Pulmonary hypertension, unspecified: Secondary | ICD-10-CM | POA: Insufficient documentation

## 2021-11-03 DIAGNOSIS — E782 Mixed hyperlipidemia: Secondary | ICD-10-CM | POA: Diagnosis not present

## 2021-11-03 DIAGNOSIS — I6523 Occlusion and stenosis of bilateral carotid arteries: Secondary | ICD-10-CM

## 2021-11-03 DIAGNOSIS — I5033 Acute on chronic diastolic (congestive) heart failure: Secondary | ICD-10-CM | POA: Diagnosis not present

## 2021-11-03 DIAGNOSIS — Z8673 Personal history of transient ischemic attack (TIA), and cerebral infarction without residual deficits: Secondary | ICD-10-CM | POA: Insufficient documentation

## 2021-11-03 DIAGNOSIS — N185 Chronic kidney disease, stage 5: Secondary | ICD-10-CM

## 2021-11-03 DIAGNOSIS — I2510 Atherosclerotic heart disease of native coronary artery without angina pectoris: Secondary | ICD-10-CM

## 2021-11-03 DIAGNOSIS — I739 Peripheral vascular disease, unspecified: Secondary | ICD-10-CM

## 2021-11-03 DIAGNOSIS — Z87891 Personal history of nicotine dependence: Secondary | ICD-10-CM | POA: Diagnosis not present

## 2021-11-03 DIAGNOSIS — Z733 Stress, not elsewhere classified: Secondary | ICD-10-CM | POA: Diagnosis not present

## 2021-11-03 DIAGNOSIS — I132 Hypertensive heart and chronic kidney disease with heart failure and with stage 5 chronic kidney disease, or end stage renal disease: Secondary | ICD-10-CM | POA: Insufficient documentation

## 2021-11-03 DIAGNOSIS — L89624 Pressure ulcer of left heel, stage 4: Secondary | ICD-10-CM | POA: Diagnosis not present

## 2021-11-03 DIAGNOSIS — L89614 Pressure ulcer of right heel, stage 4: Secondary | ICD-10-CM | POA: Diagnosis not present

## 2021-11-03 DIAGNOSIS — IMO0002 Reserved for concepts with insufficient information to code with codable children: Secondary | ICD-10-CM

## 2021-11-03 DIAGNOSIS — Z79899 Other long term (current) drug therapy: Secondary | ICD-10-CM | POA: Diagnosis not present

## 2021-11-03 DIAGNOSIS — I5022 Chronic systolic (congestive) heart failure: Secondary | ICD-10-CM

## 2021-11-03 DIAGNOSIS — E1152 Type 2 diabetes mellitus with diabetic peripheral angiopathy with gangrene: Secondary | ICD-10-CM | POA: Insufficient documentation

## 2021-11-03 LAB — BASIC METABOLIC PANEL
Anion gap: 7 (ref 5–15)
BUN: 63 mg/dL — ABNORMAL HIGH (ref 8–23)
CO2: 26 mmol/L (ref 22–32)
Calcium: 8.9 mg/dL (ref 8.9–10.3)
Chloride: 101 mmol/L (ref 98–111)
Creatinine, Ser: 3.54 mg/dL — ABNORMAL HIGH (ref 0.44–1.00)
GFR, Estimated: 14 mL/min — ABNORMAL LOW (ref >60)
Glucose, Bld: 155 mg/dL — ABNORMAL HIGH (ref 70–99)
Potassium: 4.6 mmol/L (ref 3.5–5.1)
Sodium: 134 mmol/L — ABNORMAL LOW (ref 135–145)

## 2021-11-03 LAB — CBC
HCT: 26.3 % — ABNORMAL LOW (ref 36.0–46.0)
Hemoglobin: 8.1 g/dL — ABNORMAL LOW (ref 12.0–15.0)
MCH: 28.9 pg (ref 26.0–34.0)
MCHC: 30.8 g/dL (ref 30.0–36.0)
MCV: 93.9 fL (ref 80.0–100.0)
Platelets: 287 10*3/uL (ref 150–400)
RBC: 2.8 MIL/uL — ABNORMAL LOW (ref 3.87–5.11)
RDW: 14.7 % (ref 11.5–15.5)
WBC: 8.5 10*3/uL (ref 4.0–10.5)
nRBC: 0 % (ref 0.0–0.2)

## 2021-11-03 LAB — BRAIN NATRIURETIC PEPTIDE: B Natriuretic Peptide: 398.8 pg/mL — ABNORMAL HIGH (ref 0.0–100.0)

## 2021-11-03 MED ORDER — TORSEMIDE 20 MG PO TABS
ORAL_TABLET | ORAL | 2 refills | Status: DC
  Filled 2021-11-03: qty 150, 30d supply, fill #0

## 2021-11-03 MED ORDER — METOLAZONE 2.5 MG PO TABS
2.5000 mg | ORAL_TABLET | Freq: Once | ORAL | 0 refills | Status: DC
  Filled 2021-11-03: qty 1, 1d supply, fill #0

## 2021-11-03 NOTE — Telephone Encounter (Signed)
Transition Care Management Unsuccessful Follow-up Telephone Call   Date of discharge and from where:  [facility] on 11/01/2021   Attempts:  2nd Attempt   Reason for unsuccessful TCM follow-up call:  Left voice message unable to reach pt at this time. Contact nr and info provided.

## 2021-11-03 NOTE — Progress Notes (Signed)
ReDS Vest / Clip - 11/03/21 1100       ReDS Vest / Clip   Station Marker B    Ruler Value 33    ReDS Value Range High volume overload    ReDS Actual Value 44

## 2021-11-03 NOTE — Telephone Encounter (Signed)
Called pt to make 1 week lab appt 3 week app/np appt, asked pt to call back to make appointments

## 2021-11-03 NOTE — Telephone Encounter (Signed)
Last rx was sent in by Amy on 07/05/20

## 2021-11-03 NOTE — Patient Instructions (Addendum)
EKG was done today  Labs were done today, if any labs are abnormal the clinic will call you  YOU have a appointment with the [place]  Thursday December 1st, 2022 at 3:00pm [phone]  TAKE Metolazone 1 tablet 2.5 mg today with 20 meq of potassium TODAY only with a extra dose of Torsemide 60 mg TODAY  TOMORROW INCREASE Torsemide to 60 mg 3 tablets every morning and  40 mg 2 tablets every evening  Your physician recommends that you return for lab work in: 2-3 week and please keep appointment scheduled with Dr. Aundra Dubin  Your physician recommends that you return for lab work in: 1 week  At the [facility], you and your health needs are our priority. As part of our continuing mission to provide you with exceptional heart care, we have created designated Provider Care Teams. These Care Teams include your primary Cardiologist (physician) and Advanced Practice Providers (APPs- Physician Assistants and Nurse Practitioners) who all work together to provide you with the care you need, when you need it.   You may see any of the following providers on your designated Care Team at your next follow up: Dr Glori Bickers Dr Haynes Kerns, NP Lyda Jester, [place] [facility] [place], [place] Audry Riles, PharmD   Please be sure to bring in all your medications bottles to every appointment.   If you have any questions or concerns before your next appointment please send [place] a message through [place] or call our office at [phone].    TO LEAVE A MESSAGE FOR THE NURSE SELECT OPTION 2, PLEASE LEAVE A MESSAGE INCLUDING: YOUR NAME DATE OF BIRTH CALL BACK NUMBER REASON FOR CALL**this is important as we prioritize the call backs  YOU WILL RECEIVE A CALL BACK THE SAME DAY AS LONG AS YOU CALL BEFORE 4:00 PM

## 2021-11-04 MED ORDER — INSULIN LISPRO (1 UNIT DIAL) 100 UNIT/ML (KWIKPEN)
PEN_INJECTOR | SUBCUTANEOUS | 3 refills | Status: DC
  Filled 2021-11-04: qty 15, 28d supply, fill #0

## 2021-11-06 ENCOUNTER — Telehealth (HOSPITAL_COMMUNITY): Payer: Self-pay

## 2021-11-06 ENCOUNTER — Telehealth: Payer: Self-pay

## 2021-11-06 ENCOUNTER — Other Ambulatory Visit: Payer: Self-pay

## 2021-11-06 NOTE — Telephone Encounter (Signed)
Transition Care Management Unsuccessful Follow-up Telephone Call  Date of discharge and from where:  11/01/2021, [facility]   Attempts:  3rd Attempt  Reason for unsuccessful TCM follow-up call:  Left voice message on # [phone]. Call back requested.    Letter sent to patient requesting she contact [place] as we have not been able to reach her.

## 2021-11-06 NOTE — Telephone Encounter (Signed)
Spoke to Nancy [name] who requests an appointment for a paramedicine home visit tomorrow at 1230. Confirmed same and I will follow up tomorrow on 11/22 at 1230.   Call complete.

## 2021-11-07 ENCOUNTER — Other Ambulatory Visit: Payer: Self-pay | Admitting: Internal Medicine

## 2021-11-07 ENCOUNTER — Other Ambulatory Visit (HOSPITAL_COMMUNITY): Payer: Self-pay | Admitting: *Deleted

## 2021-11-07 ENCOUNTER — Other Ambulatory Visit (HOSPITAL_COMMUNITY): Payer: Self-pay

## 2021-11-07 DIAGNOSIS — I130 Hypertensive heart and chronic kidney disease with heart failure and stage 1 through stage 4 chronic kidney disease, or unspecified chronic kidney disease: Secondary | ICD-10-CM

## 2021-11-07 DIAGNOSIS — I5030 Unspecified diastolic (congestive) heart failure: Secondary | ICD-10-CM

## 2021-11-07 DIAGNOSIS — Z794 Long term (current) use of insulin: Secondary | ICD-10-CM

## 2021-11-07 DIAGNOSIS — D631 Anemia in chronic kidney disease: Secondary | ICD-10-CM

## 2021-11-07 DIAGNOSIS — E119 Type 2 diabetes mellitus without complications: Secondary | ICD-10-CM

## 2021-11-07 MED ORDER — INSULIN LISPRO (1 UNIT DIAL) 100 UNIT/ML (KWIKPEN): PEN_INJECTOR | SUBCUTANEOUS | 3 refills | Status: DC

## 2021-11-07 MED ORDER — CARVEDILOL 12.5 MG PO TABS: 18.7500 mg | ORAL_TABLET | Freq: Two times a day (BID) | ORAL | 6 refills | Status: DC

## 2021-11-07 MED ORDER — INSULIN GLARGINE 100 UNIT/ML SOLOSTAR PEN: 30.0000 [IU] | PEN_INJECTOR | Freq: Every day | SUBCUTANEOUS | 3 refills | Status: DC

## 2021-11-07 MED ORDER — AMLODIPINE BESYLATE 10 MG PO TABS
10.0000 mg | ORAL_TABLET | Freq: Every day | ORAL | 6 refills | Status: DC
Start: 2021-11-07 — End: 2022-04-10

## 2021-11-07 MED ORDER — CALCIUM ACETATE (PHOS BINDER) 667 MG PO CAPS: 667.0000 mg | ORAL_CAPSULE | Freq: Three times a day (TID) | ORAL | 3 refills | Status: DC

## 2021-11-07 MED ORDER — ATORVASTATIN CALCIUM 40 MG PO TABS: 40.0000 mg | ORAL_TABLET | Freq: Every day | ORAL | 6 refills | Status: DC

## 2021-11-07 MED ORDER — INSULIN PEN NEEDLE 32G X 4 MM MISC: 4 refills | Status: DC

## 2021-11-07 MED ORDER — FERROUS SULFATE 325 (65 FE) MG PO TABS: 325.0000 mg | ORAL_TABLET | Freq: Every day | ORAL | 1 refills | Status: DC

## 2021-11-07 MED ORDER — TORSEMIDE 20 MG PO TABS: ORAL_TABLET | ORAL | 6 refills | Status: DC

## 2021-11-07 NOTE — Progress Notes (Signed)
Paramedicine Encounter    Patient ID: Nancy [name], female    DOB: 06/04/1961, 60 y.o.   MRN: [id number]   Patient Care Team: Johnson, Deborah B, MD as PCP - General (Internal Medicine) Berry, Jonathan J, MD as PCP - Cardiology (Cardiology) McLean, Dalton S, MD as PCP - Advanced Heart Failure (Cardiology)  Patient Active Problem List   Diagnosis Date Noted   Osteomyelitis (HCC)    Peripheral artery disease (HCC) 10/27/2021   Acute exacerbation of congestive heart failure (HCC) 10/24/2021   Acute cardiogenic pulmonary edema (HCC) 10/06/2021   Coronary artery disease involving native coronary artery of native heart without angina pectoris 10/06/2021   Goals of care, counseling/discussion    Gangrene of left foot (HCC)    Cellulitis of left foot    Diabetic ulcer of left foot (HCC) 09/04/2021   CHF (congestive heart failure) (HCC) 07/07/2021   Normocytic anemia 06/30/2021   CKD (chronic kidney disease), stage IV (HCC) 06/11/2021   Acute on chronic diastolic CHF (congestive heart failure) (HCC) 06/10/2021   Abnormal nuclear stress test    Dyspnea on exertion 03/07/2021   Orthopnea 02/25/2021   History of cerebrovascular accident (CVA) with residual deficit 05/06/2020   Diabetic peripheral neuropathy (HCC) 04/26/2020   AKI (acute kidney injury) (HCC) 04/20/2020   Generalized weakness 04/20/2020   Dehydration 04/20/2020   Generalized abdominal pain    Pancreatitis, acute 02/29/2020   Cerebrovascular accident (CVA) (HCC) 11/08/2019   Stenosis of right carotid artery 11/08/2019   Mixed diabetic hyperlipidemia associated with type 2 diabetes mellitus (HCC) 11/08/2019   Cortical age-related cataract of both eyes 08/30/2019   Gait abnormality 08/30/2019   Statin declined 08/30/2019   Gastroesophageal reflux disease without esophagitis 04/18/2018   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   Type 2 diabetes mellitus with stage 4 chronic kidney disease, with long-term current use of insulin (HCC)  10/29/2017   History of macular degeneration 10/29/2017   Chronic cervical pain 10/29/2016   Myalgia 09/14/2016   Insomnia 09/14/2016   Tinea pedis of both feet 08/20/2016   Macular degeneration 08/17/2016   Low back pain 09/21/2014   Hypertensive urgency 09/21/2014   Diabetic gastroparesis associated with type 2 diabetes mellitus (HCC) 09/14/2014   Uncontrolled hypertension 09/08/2014   Thyroid nodule 08/17/2014   Obesity (BMI 30-39.9) 08/17/2014   Nausea & vomiting 08/04/2014   Type II diabetes mellitus with neurological manifestations, uncontrolled 08/04/2014    Current Outpatient Medications:    acetaminophen (TYLENOL) 500 MG tablet, Take 500 mg by mouth every 6 (six) hours as needed for moderate pain or headache., Disp: , Rfl:    amLODipine (NORVASC) 10 MG tablet, Take 1 tablet (10 mg total) by mouth daily., Disp: 30 tablet, Rfl: 3   aspirin 81 MG EC tablet, Take 1 tablet (81 mg total) by mouth daily., Disp: 60 tablet, Rfl: 1   atorvastatin (LIPITOR) 40 MG tablet, Take 1 tablet (40 mg total) by mouth daily., Disp: 30 tablet, Rfl: 4   Blood Glucose Monitoring Suppl (ONETOUCH VERIO) w/Device KIT, Check blood sugar three times daily. E11.40, Disp: 1 kit, Rfl: 0   calcium acetate (PHOSLO) 667 MG capsule, Take 1 capsule (667 mg total) by mouth 3 (three) times daily with meals., Disp: 90 capsule, Rfl: 0   carvedilol (COREG) 12.5 MG tablet, Take 1.5 tablets (18.75 mg total) by mouth 2 (two) times daily with a meal., Disp: 90 tablet, Rfl: 5   ferrous sulfate 325 (65 FE) MG tablet, Take 1 tablet (325 mg   total) by mouth daily with breakfast., Disp: 100 tablet, Rfl: 1   glucose blood test strip, Use as instructedCheck blood sugar three times daily. E11.40, Disp: 100 each, Rfl: 12   insulin glargine (LANTUS) 100 UNIT/ML Solostar Pen, Inject 30 Units into the skin at bedtime., Disp: 15 mL, Rfl: 3   insulin lispro (HUMALOG KWIKPEN) 100 UNIT/ML KwikPen, Inject 4 units before breakfast and dinner.,  Disp: 15 mL, Rfl: 3   Insulin Pen Needle 32G X 4 MM MISC, Use to inject insulin as directed., Disp: 100 each, Rfl: 0   metolazone (ZAROXOLYN) 2.5 MG tablet, Take 1 tablet (2.5 mg total) by mouth once for 1 dose., Disp: 1 tablet, Rfl: 0   risperiDONE (RISPERDAL) 3 MG tablet, TAKE 1 TABLET (3 MG TOTAL) BY MOUTH AT BEDTIME., Disp: 30 tablet, Rfl: 2   torsemide (DEMADEX) 20 MG tablet, Take 3 tablets (60 mg total) by mouth every morning AND 2 tablets (40 mg total) every evening., Disp: 150 tablet, Rfl: 2 Allergies  Allergen Reactions   Elemental Sulfur Hives and Other (See Comments)    PATIENT STATED THIS, MORE THAN LIKELY, SHOULD HAVE BEEN LOGGED AS "SULFA"   Hydralazine Hcl Other (See Comments)    Hair loss   Hydrocodone Itching and Other (See Comments)    Upset stomach   Metformin And Related Nausea And Vomiting and Other (See Comments)    Stomach pains, also   Other Nausea Only and Other (See Comments)    Lettuce- Does not digest this!!   Plaquenil [Hydroxychloroquine Sulfate] Hives   Shellfish-Derived Products Nausea Only and Other (See Comments)    Caused an upset stomach   Shrimp (Diagnostic) Nausea Only and Other (See Comments)    Upset stomach    Sulfa Antibiotics Hives     Social History   Socioeconomic History   Marital status: Legally Separated    Spouse name: [name]   Number of children: 1   Years of education: some colle   Highest education level: Not on file  Occupational History   Occupation: Employed FT as data analyst    Comment: [employer] , NA  Tobacco Use   Smoking status: Former    Packs/day: 0.25    Years: 0.50    Pack years: 0.13    Types: Cigarettes   Smokeless tobacco: Never   Tobacco comments:    Former smoker 11/03/21  Vaping Use   Vaping Use: Never used  Substance and Sexual Activity   Alcohol use: No   Drug use: No   Sexual activity: Yes    Birth control/protection: None  Other Topics Concern   Not on file  Social History Narrative    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  Constitutional:      Appearance: Normal appearance. She is normal weight.  HENT:     Head:   Normocephalic.     Nose: Nose normal.  Eyes:     Conjunctiva/sclera: Conjunctivae normal.     Pupils: Pupils are equal, round, and reactive to light.  Cardiovascular:     Rate and Rhythm: Normal rate and regular rhythm.     Pulses: Normal pulses.     Heart sounds: Normal heart sounds.  Pulmonary:     Effort: Pulmonary effort is normal. No respiratory distress.     Breath sounds: Normal breath sounds. No wheezing, rhonchi or rales.  Abdominal:     General: Abdomen is flat.     Palpations: Abdomen is soft.  Musculoskeletal:        General: Swelling present.     Cervical back: Normal range of motion.     Right lower leg: Edema present.     Left lower leg: Edema present.  Skin:    General: Skin is warm and dry.     Capillary Refill: Capillary refill takes less than 2 seconds.  Neurological:     General: No focal deficit present.     Mental Status: She is alert. Mental status is at baseline.  Psychiatric:        Mood and Affect: Mood normal.    Arrived for home visit for Mrs. Macfarlane who was seated in her chair alert and oriented. Mrs. Knaggs reports feeling much better this week and denied any chest pain, dizziness, or shortness of breath but stated  her legs were still swollen. Mrs. Cavanaugh is seated most of the day and does not properly elevate her feet. I explained the importance of this and she verbalized understanding. No UNNA Boot has been placed yet, I will make HF clinic NP aware. I reviewed medications and filled one week of pill box for Mrs. Christoffersen. She is currently out of Aspirin and is not taking her Humalong because she ran out. She also is not checking her sugar due to not having a glucometer. I will message PCP about same. Mrs. Notz reports she urinates around 6 times a day and that she did not have an increased urine output with the one time dose of metolazone. I will make clinic staff aware. We reviewed upcoming appointments and confirmed same. Housing options being investigated by myself and LCSW Jenna Uris. I will keep patient updated with findings.  Home visit complete.  Refills: Ferrous Sulfate  Humalog Glucometer   Patient wants medications sent to [facility] from now on.     Future Appointments  Date Time Provider Department Center  11/16/2021  3:00 PM Robson, Michael G, MD [facility]  11/29/2021 10:00 AM [facility] - [facility]  11/29/2021 10:30 AM [facility] - [facility]  11/29/2021 11:00 AM Cain, Brandon Christopher, MD [facility]  12/04/2021 10:00 AM Singh, Mayanka, MD [facility]  01/05/2022 12:00 PM McLean, Dalton S, MD [facility] None     ACTION: Home visit completed       

## 2021-11-08 ENCOUNTER — Other Ambulatory Visit (HOSPITAL_COMMUNITY): Payer: Self-pay

## 2021-11-13 ENCOUNTER — Other Ambulatory Visit: Payer: Self-pay

## 2021-11-13 ENCOUNTER — Ambulatory Visit (HOSPITAL_COMMUNITY)
Admission: RE | Admit: 2021-11-13 | Discharge: 2021-11-13 | Disposition: A | Discharge status: Home / Self Care | Payer: Medicaid Other | Source: Ambulatory Visit | Attending: Cardiology | Admitting: Cardiology

## 2021-11-13 DIAGNOSIS — I5022 Chronic systolic (congestive) heart failure: Secondary | ICD-10-CM | POA: Diagnosis not present

## 2021-11-13 LAB — BASIC METABOLIC PANEL
Anion gap: 9 (ref 5–15)
BUN: 47 mg/dL — ABNORMAL HIGH (ref 8–23)
CO2: 25 mmol/L (ref 22–32)
Calcium: 9.1 mg/dL (ref 8.9–10.3)
Chloride: 98 mmol/L (ref 98–111)
Creatinine, Ser: 3.52 mg/dL — ABNORMAL HIGH (ref 0.44–1.00)
GFR, Estimated: 14 mL/min — ABNORMAL LOW (ref >60)
Glucose, Bld: 230 mg/dL — ABNORMAL HIGH (ref 70–99)
Potassium: 4.2 mmol/L (ref 3.5–5.1)
Sodium: 132 mmol/L — ABNORMAL LOW (ref 135–145)

## 2021-11-14 ENCOUNTER — Other Ambulatory Visit: Payer: Self-pay

## 2021-11-15 ENCOUNTER — Other Ambulatory Visit (HOSPITAL_COMMUNITY): Payer: Self-pay

## 2021-11-15 ENCOUNTER — Telehealth: Payer: Self-pay | Admitting: Internal Medicine

## 2021-11-15 MED ORDER — DEXCOM G6 RECEIVER DEVI: 1.0000 | Freq: Every day | 0 refills | Status: DC

## 2021-11-15 MED ORDER — DEXCOM G6 SENSOR MISC: 1.0000 | Freq: Every day | 11 refills | Status: DC

## 2021-11-15 MED ORDER — DEXCOM G6 TRANSMITTER MISC: 1.0000 | Freq: Every day | 4 refills | Status: DC

## 2021-11-15 NOTE — Telephone Encounter (Signed)
-----   Message from Salena Saner, EMT sent at 11/15/2021  5:33 PM EST ----- Regarding: Glucometer Hey Dr. Wynetta Emery, I saw Nancy [name] in the home today and her sugar was 401. She had not eaten or taken any insulin as of 1230 today and was given 4 units of Novolog. She is not checking her sugar at home due to lack of glucometer and supplies. She is requesting a DEXCOM or Freestyle meter but I advised her that would be up to you and the insurance. At present she just needs something to check her sugar. I coached her on diet and med compliance today as well. Please advise as I know she is seeing you next week and I will follow up next Wednesday. Thanks!

## 2021-11-15 NOTE — Progress Notes (Signed)
Paramedicine Encounter    Patient ID: Nancy [name], female    DOB: 12-13-1961, 60 y.o.   MRN: [id number]   Patient Care Team: [name], MD as PCP - General (Internal Medicine) [name], MD as PCP - Cardiology (Cardiology) Larey Dresser, MD as PCP - Advanced Heart Failure (Cardiology)  Patient Active Problem List   Diagnosis Date Noted   Osteomyelitis [facility])    Peripheral artery disease ([place]) 10/27/2021   Acute exacerbation of congestive heart failure ([place]) 10/24/2021   Acute cardiogenic pulmonary edema ([place]) 10/06/2021   Coronary artery disease involving native coronary artery of native heart without angina pectoris 10/06/2021   Goals of care, counseling/discussion    Gangrene of left foot ([place])    Cellulitis of left foot    Diabetic ulcer of left foot ([place]) 09/04/2021   CHF (congestive heart failure) ([place]) 07/07/2021   Normocytic anemia 06/30/2021   CKD (chronic kidney disease), stage IV ([place]) 06/11/2021   Acute on chronic diastolic CHF (congestive heart failure) ([place]) 06/10/2021   Abnormal nuclear stress test    Dyspnea on exertion 03/07/2021   Orthopnea 02/25/2021   History of cerebrovascular accident (CVA) with residual deficit 05/06/2020   Diabetic peripheral neuropathy ([place]) 04/26/2020   AKI (acute kidney injury) ([place]) 04/20/2020   Generalized weakness 04/20/2020   Dehydration 04/20/2020   Generalized abdominal pain    Pancreatitis, acute 02/29/2020   Cerebrovascular accident (CVA) ([place]) 11/08/2019   Stenosis of right carotid artery 11/08/2019   Mixed diabetic hyperlipidemia associated with type 2 diabetes mellitus ([place]) 11/08/2019   Cortical age-related cataract of both eyes 08/30/2019   Gait abnormality 08/30/2019   Statin declined 08/30/2019   Gastroesophageal reflux disease without esophagitis 04/18/2018   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   Type 2 diabetes mellitus with stage 4 chronic kidney disease, with long-term current use of insulin ([place])  10/29/2017   History of macular degeneration 10/29/2017   Chronic cervical pain 10/29/2016   Myalgia 09/14/2016   Insomnia 09/14/2016   Tinea pedis of both feet 08/20/2016   Macular degeneration 08/17/2016   Low back pain 09/21/2014   Hypertensive urgency 09/21/2014   Diabetic gastroparesis associated with type 2 diabetes mellitus ([place]) 09/14/2014   Uncontrolled hypertension 09/08/2014   Thyroid nodule 08/17/2014   Obesity (BMI 30-39.9) 08/17/2014   Nausea & vomiting 08/04/2014   Type II diabetes mellitus with neurological manifestations, uncontrolled 08/04/2014    Current Outpatient Medications:    acetaminophen (TYLENOL) 500 MG tablet, Take 500 mg by mouth every 6 (six) hours as needed for moderate pain or headache., Disp: , Rfl:    amLODipine (NORVASC) 10 MG tablet, Take 1 tablet (10 mg total) by mouth daily., Disp: 30 tablet, Rfl: 6   aspirin 81 MG EC tablet, Take 1 tablet (81 mg total) by mouth daily. (Patient not taking: Reported on 11/07/2021), Disp: 60 tablet, Rfl: 1   atorvastatin (LIPITOR) 40 MG tablet, Take 1 tablet (40 mg total) by mouth daily., Disp: 30 tablet, Rfl: 6   Blood Glucose Monitoring Suppl (ONETOUCH VERIO) w/Device KIT, Check blood sugar three times daily. E11.40 (Patient not taking: Reported on 11/07/2021), Disp: 1 kit, Rfl: 0   calcium acetate (PHOSLO) 667 MG capsule, Take 1 capsule (667 mg total) by mouth 3 (three) times daily with meals., Disp: 90 capsule, Rfl: 3   carvedilol (COREG) 12.5 MG tablet, Take 1.5 tablets (18.75 mg total) by mouth 2 (two) times daily with a meal., Disp: 90 tablet, Rfl: 6  ferrous sulfate 325 (65 FE) MG tablet, Take 1 tablet (325 mg total) by mouth daily with breakfast., Disp: 100 tablet, Rfl: 1   glucose blood test strip, Use as instructedCheck blood sugar three times daily. E11.40, Disp: 100 each, Rfl: 12   insulin glargine (LANTUS) 100 UNIT/ML Solostar Pen, Inject 30 Units into the skin at bedtime., Disp: 15 mL, Rfl: 3   insulin  lispro (HUMALOG KWIKPEN) 100 UNIT/ML KwikPen, Inject 4 units before breakfast and dinner., Disp: 15 mL, Rfl: 3   Insulin Pen Needle 32G X 4 MM MISC, Use to inject insulin as directed., Disp: 100 each, Rfl: 4   metolazone (ZAROXOLYN) 2.5 MG tablet, Take 1 tablet (2.5 mg total) by mouth once for 1 dose., Disp: 1 tablet, Rfl: 0   risperiDONE (RISPERDAL) 3 MG tablet, TAKE 1 TABLET (3 MG TOTAL) BY MOUTH AT BEDTIME., Disp: 30 tablet, Rfl: 2   torsemide (DEMADEX) 20 MG tablet, Take 3 tablets (60 mg total) by mouth every morning AND 2 tablets (40 mg total) every evening., Disp: 150 tablet, Rfl: 6 Allergies  Allergen Reactions   Elemental Sulfur Hives and Other (See Comments)    PATIENT STATED THIS, MORE THAN LIKELY, SHOULD HAVE BEEN LOGGED AS "SULFA"   Hydralazine Hcl Other (See Comments)    Hair loss   Hydrocodone Itching and Other (See Comments)    Upset stomach   Metformin And Related Nausea And Vomiting and Other (See Comments)    Stomach pains, also   Other Nausea Only and Other (See Comments)    Lettuce- Does not digest this!!   Plaquenil [Hydroxychloroquine Sulfate] Hives   Shellfish-Derived Products Nausea Only and Other (See Comments)    Caused an upset stomach   Shrimp (Diagnostic) Nausea Only and Other (See Comments)    Upset stomach    Sulfa Antibiotics Hives     Social History   Socioeconomic History   Marital status: Legally Separated    Spouse name: [name]   Number of children: 1   Years of education: some colle   Highest education level: Not on file  Occupational History   Occupation: Employed FT as Landscape architect    Comment: [employer] , NA  Tobacco Use   Smoking status: Former    Packs/day: 0.25    Years: 0.50    Pack years: 0.13    Types: Cigarettes   Smokeless tobacco: Never   Tobacco comments:    Former smoker 11/03/21  Vaping Use   Vaping Use: Never used  Substance and Sexual Activity   Alcohol use: No   Drug use: No   Sexual activity: Yes    Birth  control/protection: None  Other Topics Concern   Not on file  Social History Narrative   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  Constitutional:      Appearance: Normal  appearance. She is normal weight.  HENT:     Head: Normocephalic.     Nose: Nose normal.     Mouth/Throat:     Mouth: Mucous membranes are moist.     Pharynx: Oropharynx is clear.  Eyes:     Conjunctiva/sclera: Conjunctivae normal.     Pupils: Pupils are equal, round, and reactive to light.  Cardiovascular:     Rate and Rhythm: Normal rate and regular rhythm.     Pulses: Normal pulses.     Heart sounds: Normal heart sounds.  Pulmonary:     Effort: Pulmonary effort is normal.     Breath sounds: Normal breath sounds.  Abdominal:     General: Abdomen is flat.     Palpations: Abdomen is soft.  Musculoskeletal:        General: Swelling present.     Cervical back: Normal range of motion.     Right lower leg: Edema present.     Left lower leg: Edema present.  Skin:    General: Skin is warm and dry.     Capillary Refill: Capillary refill takes less than 2 seconds.  Neurological:     General: No focal deficit present.     Mental Status: She is alert. Mental status is at baseline.  Psychiatric:        Mood and Affect: Mood normal.    Arrived for home visit for [name] where she was seated in a chair alert and  oriented. She reports feeling good with no complaints today. She denies shortness of breath, dizziness, chest pain or trouble sleeping. She reports she is taking her medications. Shristi noted to have missed every night of her risperdal dose. I reminded her the importance of taking all of her medications. She agreed. She has not received a glucometer but is taking her insulin. She has not had insulin today. I checked her sugar and it was 401. She was given 4 units of her Novolog at 1235. I will reach out to PCP about glucometer and insulin doses. Patient and her husband requesting a Dexcom or Freestyle. I will relay this to PCP. I provided coaching on diet, med compliance and management. I also provided a list of housing opportunities in the area within their budget. They were grateful for this and will be exploring those options. I will see Nancy [name] in one week. Home visit complete.    Refills: NONE   [facility] TO [facility]     Future Appointments  Date Time Provider [place]  11/16/2021 10:30 AM Ricard Dillon, MD [facility]  11/20/2021 10:50 AM [name], MD [facility] None  11/29/2021 10:00 AM [facility] 3 - EM [facility]  11/29/2021 10:30 AM [facility] - [facility]  11/29/2021 11:00 AM Waynetta Sandy, MD [facility]  12/04/2021 10:00 AM Laurice Record, MD [facility]  01/05/2022 12:00 PM Larey Dresser, MD [facility] None     ACTION: Home visit completed

## 2021-11-16 ENCOUNTER — Other Ambulatory Visit: Payer: Self-pay

## 2021-11-16 ENCOUNTER — Encounter (HOSPITAL_BASED_OUTPATIENT_CLINIC_OR_DEPARTMENT_OTHER): Payer: Medicaid Other | Attending: Internal Medicine | Admitting: Internal Medicine

## 2021-11-16 DIAGNOSIS — L97428 Non-pressure chronic ulcer of left heel and midfoot with other specified severity: Secondary | ICD-10-CM | POA: Diagnosis not present

## 2021-11-16 DIAGNOSIS — E1152 Type 2 diabetes mellitus with diabetic peripheral angiopathy with gangrene: Secondary | ICD-10-CM | POA: Insufficient documentation

## 2021-11-16 DIAGNOSIS — N185 Chronic kidney disease, stage 5: Secondary | ICD-10-CM | POA: Diagnosis not present

## 2021-11-16 DIAGNOSIS — I5032 Chronic diastolic (congestive) heart failure: Secondary | ICD-10-CM | POA: Insufficient documentation

## 2021-11-16 DIAGNOSIS — E1142 Type 2 diabetes mellitus with diabetic polyneuropathy: Secondary | ICD-10-CM | POA: Insufficient documentation

## 2021-11-16 DIAGNOSIS — E1122 Type 2 diabetes mellitus with diabetic chronic kidney disease: Secondary | ICD-10-CM | POA: Diagnosis not present

## 2021-11-16 DIAGNOSIS — L97522 Non-pressure chronic ulcer of other part of left foot with fat layer exposed: Secondary | ICD-10-CM | POA: Insufficient documentation

## 2021-11-16 DIAGNOSIS — I132 Hypertensive heart and chronic kidney disease with heart failure and with stage 5 chronic kidney disease, or end stage renal disease: Secondary | ICD-10-CM | POA: Diagnosis not present

## 2021-11-16 DIAGNOSIS — E11621 Type 2 diabetes mellitus with foot ulcer: Secondary | ICD-10-CM | POA: Insufficient documentation

## 2021-11-16 NOTE — Progress Notes (Signed)
Nancy [name], BLACKSHER ([id number]) Visit Report for 11/16/2021 Debridement Details Patient Name: Date of Service: Nancy [name] 11/16/2021 10:30 A M Medical Record Number: [id number] Patient Account Number: [id number] Date of Birth/Sex: Treating RN: November 02, 1961 (60 y.o. Nancy [name], Meta.Reding Primary Care Provider: [name] Other Clinician: Referring Provider: Treating Provider/Extender: Nyra Market in Treatment: 6 Debridement Performed for Assessment: Wound #2 Left Calcaneus Performed By: Physician Ricard Dillon., MD Debridement Type: Debridement Severity of Tissue Pre Debridement: Fat layer exposed Level of Consciousness (Pre-procedure): Awake and Alert Pre-procedure Verification/Time Out Yes - 11:35 Taken: Start Time: 11:35 Pain Control: Lidocaine T Area Debrided (L x W): otal 4.2 (cm) x 4.1 (cm) = 17.22 (cm) Tissue and other material debrided: Viable, Non-Viable, Callus, Subcutaneous, Skin: Dermis , Skin: Epidermis Level: Skin/Subcutaneous Tissue Debridement Description: Excisional Instrument: Curette Bleeding: Minimum Hemostasis Achieved: Pressure End Time: 11:35 Procedural Pain: 0 Post Procedural Pain: 0 Response to Treatment: Procedure was tolerated well Level of Consciousness (Post- Awake and Alert procedure): Post Debridement Measurements of Total Wound Length: (cm) 4.2 Width: (cm) 4.1 Depth: (cm) 1.2 Volume: (cm) 16.229 Character of Wound/Ulcer Post Debridement: Improved Severity of Tissue Post Debridement: Fat layer exposed Post Procedure Diagnosis Same as Pre-procedure Electronic Signature(s) Signed: 11/16/2021 4:34:24 PM By: Linton Ham MD Signed: 11/16/2021 5:22:04 PM By: Deon Pilling RN, BSN Entered By: Linton Ham on 11/16/2021 11:56:34 -------------------------------------------------------------------------------- Debridement Details Patient Name: Date of Service: Nancy Nancy Aly H. 11/16/2021  10:30 A M Medical Record Number: [id number] Patient Account Number: [id number] Date of Birth/Sex: Treating RN: Jul 29, 1961 (60 y.o. Nancy [name], Meta.Reding Primary Care Provider: [name] Other Clinician: Referring Provider: Treating Provider/Extender: Nyra Market in Treatment: 6 Debridement Performed for Assessment: Wound #3 Left,Lateral Foot Performed By: Physician Ricard Dillon., MD Debridement Type: Debridement Severity of Tissue Pre Debridement: Fat layer exposed Level of Consciousness (Pre-procedure): Awake and Alert Pre-procedure Verification/Time Out Yes - 11:35 Taken: Start Time: 11:35 Pain Control: Lidocaine T Area Debrided (L x W): otal 1 (cm) x 2 (cm) = 2 (cm) Tissue and other material debrided: Viable, Non-Viable, Callus, Subcutaneous, Skin: Dermis , Skin: Epidermis Level: Skin/Subcutaneous Tissue Debridement Description: Excisional Instrument: Curette Bleeding: Minimum Hemostasis Achieved: Pressure End Time: 11:35 Procedural Pain: 0 Post Procedural Pain: 0 Response to Treatment: Procedure was tolerated well Level of Consciousness (Post- Awake and Alert procedure): Post Debridement Measurements of Total Wound Length: (cm) 1 Width: (cm) 2 Depth: (cm) 0.5 Volume: (cm) 0.785 Character of Wound/Ulcer Post Debridement: Improved Severity of Tissue Post Debridement: Fat layer exposed Post Procedure Diagnosis Same as Pre-procedure Electronic Signature(s) Signed: 11/16/2021 4:34:24 PM By: Linton Ham MD Signed: 11/16/2021 5:22:04 PM By: Deon Pilling RN, BSN Entered By: Linton Ham on 11/16/2021 11:56:53 -------------------------------------------------------------------------------- HPI Details Patient Name: Date of Service: Nancy Nancy Aly H. 11/16/2021 10:30 A M Medical Record Number: [id number] Patient Account Number: [id number] Date of Birth/Sex: Treating RN: June 29, 1961 (60 y.o. [name] Primary Care  Provider: [name] Other Clinician: Referring Provider: Treating Provider/Extender: Nyra Market in Treatment: 6 History of Present Illness HPI Description: ADMISSION 10/04/2021 This is a 60 year old woman with multiple severe medical issues who arrives for review of wounds on her left foot. Recent medical history in epic shows a cellulitis of these left foot with gangrenous change and admission to hospital from 9/19 through 10/6. An MRI of the foot surprisingly did not show osteomyelitis. In the hospital she was  treated with IV antibiotics and then discharged on Augmentin and doxycycline she has completed this. She also had noninvasive arterial studies that showed an ABI in the right of 0.45 on the left 0.46 monophasic waveforms bilaterally. They did not do TBI's. The patient was seen by Dr. Trula Slade in consult in the hospital. He felt she had evidence of arterial occlusive disease with an ABI on the left of 0.5 with monophasic waveforms at the ankle. Noted that any attempt at using dye in this patient would potentially push her into acute renal failure and with the patient who has been refusing dialysis this would be a fatal event. The patient saw palliative care but she was not interested in pursuing pure palliative care or SNF placement she has Medicaid pending but she does have home health The patient also was seen by nephrology in the hospital. She told them she did not want dialysis I think because her mother was on dialysis at 1 point. They did recommend a AV fistula. Again the patient refused. They noted deterioration from stage IV to stage V chronic renal failure but they did not feel there was an urgent need for dialysis at that moment. The patient has a large boggy ischemic wound on most of her left heel. On the left lateral foot there is an open area with covering dry gangrene. On the dorsal foot just distal to the ankle there is a better looking  wound surface that may have been a wrap injury at 1 point. The patient says that her heel and lateral foot have been there for about 2 months and the dorsal foot is uncertain. She has minimal tolerance with walking because of pain in her legs likely claudication. 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 Her discharge creatinine in the hospital was 2.75 with an estimated GFR of 19. It was much higher than this on presentation. Both vascular and Ortho recommended an amputation which the patient refused 11/3; since the patient was last here she was hospitalized for 5 days from 10/05/2021 through 10/10/2021 this was because of acute on chronic congestive heart failure. Post diuresis she was felt to have chronic kidney disease stage IV. Nephrology was consulted during the original admission and felt there was no indication for starting dialysis while an inpatient. Apparently has a follow-up with nephrologist Dr. Hollie Salk in 2 or 3 months. She did not see a vascular during this hospitalization although she previously saw Dr. Trula Slade at a point in time where she was apparently refusing dialysis although I have said previously I do not think she really understood what she was being told We have been using Betadine on the necrotic wounds on the heel and the lateral foot and silver collagen on the dorsal foot wound on the left she does not have any wounds on the right. She comes in today with a necrotic eschar on the heel and the lateral foot separating visibly. 12/1; since the patient was last here almost a month ago she was hospitalized from 10/24/2021 through 11/01/2021. 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 They correctly pointed out that her MRI was contraindicated but did not do anything beyond  this. Orthopedic surgeons recommended an amputation which I am not sure that the patient agreed to. Also saw infectious disease who confirmed that "antibiotic treatment  is not curative" she also had acute on chronic diastolic heart failure and now they are saying her chronic kidney disease stage IV is actually improved. I am not sure why they would not have addressed her vascular issues in the hospital. I looked back on her original arterial studies in September. These showed monophasic waveforms at the PTA and dorsalis pedis on the left with a ABI of 0.50. They are still using Dakin's wet-to-dry. Electronic Signature(s) Signed: 11/16/2021 4:34:24 PM By: Linton Ham MD Entered By: Linton Ham on 11/16/2021 12:02:37 -------------------------------------------------------------------------------- Physical Exam Details Patient Name: Date of Service: Nancy Nancy Aly H. 11/16/2021 10:30 A M Medical Record Number: [id number] Patient Account Number: [id number] Date of Birth/Sex: Treating RN: [birth year] (60 y.o. [name] Primary Care Provider: [name] Other Clinician: Referring Provider: Treating Provider/Extender: Nyra Market in Treatment: 6 Constitutional Sitting or standing Blood Pressure is within target range for patient.. Pulse regular and within target range for patient.Marland Kitchen Respirations regular, non-labored and within target range.. Temperature is normal and within the target range for the patient.Marland Kitchen Appears in no distress. Cardiovascular Pedal pulses are not palpable in the left foot. Nonpitting edema in the left leg. Notes Wound exam; the left heel wound area actually looks better than when I last saw this. It required a light debridement of necrotic debris on the surface but overall surprisingly good. The bottom of the wound is precariously close to bone and would not be impossible to do a bone biopsy here. She has a superficial wound  on the left lateral plantar foot. The wrap injury wound on the dorsal ankle is actually closed Electronic Signature(s) Signed: 11/16/2021 4:34:24 PM By: Linton Ham MD Entered By: Linton Ham on 11/16/2021 12:04:24 -------------------------------------------------------------------------------- Physician Orders Details Patient Name: Date of Service: Nancy Nancy Aly H. 11/16/2021 10:30 A M Medical Record Number: [id number] Patient Account Number: [id number] Date of Birth/Sex: Treating RN: 27-Aug-1961 (60 y.o. Tonita Phoenix, Lauren Primary Care Provider: [name] Other Clinician: Referring Provider: Treating Provider/Extender: Nyra Market in Treatment: 6 Verbal / Phone Orders: No Diagnosis Coding Follow-up Appointments Return A ppointment in 2 weeks. Other: - I am calling Dr. Kennon Holter office today to set up an appt. Bathing/ Shower/ Hygiene May shower with protection but do not get wound dressing(s) wet. Edema Control - Lymphedema / SCD / Other Elevate legs to the level of the heart or above for 30 minutes daily and/or when sitting, a frequency of: Avoid standing for long periods of time. Off-Loading Other: - Offloading boot to left foot; float heel/foot when sitting/laying down. Use pillow and place under your leg so your heel isnt laying against bed!!!! [facility] wound care orders this week; continue Home Health for wound care. May utilize formulary equivalent dressing for wound treatment orders unless otherwise specified. - [place] to change 2-3 x a wk Wound Treatment Wound #2 - Calcaneus Wound Laterality: Left Cleanser: Normal Saline (Home Health) 1 x Per Day/30 Days Discharge Instructions: Cleanse the wound with Normal Saline prior to applying a clean dressing using gauze sponges, not tissue or cotton balls. Cleanser: Soap and Water [facility]) 1 x Per Day/30 Days Discharge Instructions: May shower and wash wound  with dial antibacterial soap and water prior to dressing change. Cleanser: Wound Cleanser (Home Health) 1 x Per Day/30 Days Discharge Instructions: Cleanse the wound with wound cleanser prior to applying a clean dressing using gauze sponges, not tissue  or cotton balls. Prim Dressing: 1/4 strength Dakin's wet to dry (Home Health) 1 x Per Day/30 Days ary Discharge Instructions: apply Dakin's wet to dry apply directly to wound bed. Secondary Dressing: Woven Gauze Sponge, Non-Sterile 4x4 in (Home Health) 1 x Per Day/30 Days Discharge Instructions: Apply over primary dressing as directed. Secondary Dressing: ABD Pad, 5x9 (Home Health) 1 x Per Day/30 Days Discharge Instructions: Apply over primary dressing as directed. Secured With: [employer], 4.5x3.1 (in/yd) (Home Health) 1 x Per Day/30 Days Discharge Instructions: Secure with Kerlix as directed. Secured With: 17M Medipore H Soft Cloth Surgical T ape, 4 x 10 (in/yd) (Home Health) 1 x Per Day/30 Days Discharge Instructions: Secure with tape as directed. Wound #3 - Foot Wound Laterality: Left, Lateral Cleanser: Normal Saline (Home Health) 1 x Per Day/30 Days Discharge Instructions: Cleanse the wound with Normal Saline prior to applying a clean dressing using gauze sponges, not tissue or cotton balls. Cleanser: Soap and Water [facility]) 1 x Per Day/30 Days Discharge Instructions: May shower and wash wound with dial antibacterial soap and water prior to dressing change. Cleanser: Wound Cleanser (Home Health) 1 x Per Day/30 Days Discharge Instructions: Cleanse the wound with wound cleanser prior to applying a clean dressing using gauze sponges, not tissue or cotton balls. Prim Dressing: 1/4 strength Dakin's wet to dry (Home Health) 1 x Per Day/30 Days ary Discharge Instructions: apply Dakin's wet to dry apply directly to wound bed. Secondary Dressing: Woven Gauze Sponge, Non-Sterile 4x4 in (Home Health) 1 x Per Day/30 Days Discharge  Instructions: Apply over primary dressing as directed. Secondary Dressing: ABD Pad, 5x9 (Home Health) 1 x Per Day/30 Days Discharge Instructions: Apply over primary dressing as directed. Secured With: [employer], 4.5x3.1 (in/yd) (Home Health) 1 x Per Day/30 Days Discharge Instructions: Secure with Kerlix as directed. Secured With: 17M Medipore H Soft Cloth Surgical T ape, 4 x 10 (in/yd) (Home Health) 1 x Per Day/30 Days Discharge Instructions: Secure with tape as directed. Electronic Signature(s) Signed: 11/16/2021 4:34:24 PM By: Linton Ham MD Signed: 11/16/2021 5:20:51 PM By: Rhae Hammock RN Signed: 11/16/2021 5:20:51 PM By: Rhae Hammock RN Entered By: Rhae Hammock on 11/16/2021 11:54:10 -------------------------------------------------------------------------------- Problem List Details Patient Name: Date of Service: Nancy Nancy Aly H. 11/16/2021 10:30 A M Medical Record Number: [id number] Patient Account Number: [id number] Date of Birth/Sex: Treating RN: August 02, 1961 (60 y.o. 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 11/16/2021 10:30 A M Medical Record Number: [id number] Patient Account Number: [id number] Date of Birth/Sex: Treating RN: Apr 02, 1961 (60 y.o. [name] Primary Care Provider: [name] Other Clinician: Referring Provider: Treating Provider/Extender: Nyra Market in  Treatment: 6 Subjective History of Present Illness (HPI) ADMISSION 10/04/2021 This is a 60 year old woman with multiple severe medical issues who arrives for review of wounds on her left foot. Recent medical history in epic shows a cellulitis of these left foot with gangrenous change and admission to hospital from 9/19 through 10/6. An MRI of the foot surprisingly did not show osteomyelitis. In the hospital she was treated with IV antibiotics and then discharged on Augmentin and doxycycline she has completed this. She also had noninvasive arterial studies that showed an ABI in the right of 0.45 on the left 0.46 monophasic waveforms bilaterally. They did not do TBI's. The patient was seen by Dr. Trula Slade in consult in the hospital. He felt she had evidence of arterial occlusive disease with an ABI on the left of 0.5 with monophasic waveforms at the ankle. Noted that any attempt at using dye in this patient would potentially push her into acute renal failure and with the patient who has been refusing dialysis this would be a fatal event. The patient saw palliative care but she was not interested in pursuing pure palliative care or SNF placement she has Medicaid pending but she does have home health The patient also was seen by nephrology in the hospital. She told them she did not want dialysis I think because her mother was on dialysis at 1 point. They did recommend a AV fistula. Again the patient  refused. They noted deterioration from stage IV to stage V chronic renal failure but they did not feel there was an urgent need for dialysis at that moment. The patient has a large boggy ischemic wound on most of her left heel. On the left lateral foot there is an open area with covering dry gangrene. On the dorsal foot just distal to the ankle there is a better looking wound surface that may have been a wrap injury at 1 point. The patient says that her heel and lateral foot have been there for about 2 months and the dorsal foot is uncertain. She has minimal tolerance with walking because of pain in her legs likely claudication. 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 Her discharge creatinine in the hospital was 2.75 with an estimated GFR of 19. It was much higher than this on presentation. Both vascular and Ortho recommended an amputation which the patient refused 11/3; since the patient was last here she was hospitalized for 5 days from 10/05/2021 through 10/10/2021 this was because of acute on chronic congestive heart failure. Post diuresis she was felt to have chronic kidney disease stage IV. Nephrology was consulted during the original admission and felt there was no indication for starting dialysis while an inpatient. Apparently has a follow-up with nephrologist Dr. Hollie Salk in 2 or 3 months. She did not see a vascular during this hospitalization although she previously saw Dr. Trula Slade at a point in time where she was apparently refusing dialysis although I have said previously I do not think she really understood what she was being told We have been using Betadine on the necrotic wounds on the heel and the lateral foot and silver collagen on the dorsal foot wound on the left she does not have any wounds on the right. She comes in today with a necrotic eschar on the heel and  the lateral foot separating visibly. 12/1; since the patient was last here almost a month ago she was hospitalized from 10/24/2021 through  11/01/2021. 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 They correctly pointed out that her MRI was contraindicated but did not do anything beyond this. Orthopedic surgeons recommended an amputation which I am not sure that the patient agreed to. Also saw infectious disease who confirmed that "antibiotic treatment is not curative" she also had acute on chronic diastolic heart failure and now they are saying her chronic kidney disease stage IV is actually improved. I am not sure why they would not have addressed her vascular issues in the hospital. I looked back on her original arterial studies in September. These showed monophasic waveforms at the PTA and dorsalis pedis on the left with a ABI of 0.50. They are still using Dakin's wet-to-dry. Objective Constitutional Sitting or standing Blood Pressure is within target range for patient.. Pulse regular and within target range for patient.Marland Kitchen Respirations regular, non-labored and within target range.. Temperature is normal and within the target range for the patient.Marland Kitchen Appears in no distress. Vitals Time Taken: 10:37 AM, Height: 63 in, Weight: 232 lbs, BMI: 41.1, Temperature: 98.5 F, Pulse: 74 bpm, Respiratory Rate: 17 breaths/min, Blood Pressure: 130/73 mmHg, Capillary Blood Glucose: 178 mg/dl. Cardiovascular Pedal pulses are not palpable in the left foot. Nonpitting edema in the left leg. General Notes: Wound exam; the left heel wound area actually looks better than when I last saw this. It required a light debridement of necrotic debris on the surface but overall surprisingly good. The bottom of the wound is precariously close to bone and would not be impossible to do a bone biopsy here. She has a superficial wound on the  left lateral plantar foot. The wrap injury wound on the dorsal ankle is actually closed Integumentary (Hair, Skin) Wound #1 status is Open. Original cause of wound was Gradually Appeared. The date acquired was: 09/04/2021. The wound has been in treatment 6 weeks. The wound is located on the Left,Dorsal Foot. The wound measures 0cm length x 0cm width x 0cm depth; 0cm^2 area and 0cm^3 volume. There is Fat Layer (Subcutaneous Tissue) exposed. There is a medium amount of serosanguineous drainage noted. The wound margin is distinct with the outline attached to the wound base. There is medium (34-66%) granulation within the wound bed. There is a medium (34-66%) amount of necrotic tissue within the wound bed including Adherent Slough. Wound #2 status is Open. Original cause of wound was Gradually Appeared. The date acquired was: 08/04/2021. The wound has been in treatment 6 weeks. The wound is located on the Left Calcaneus. The wound measures 4.2cm length x 4.1cm width x 1.2cm depth; 13.525cm^2 area and 16.229cm^3 volume. There is a large amount of serosanguineous drainage noted. The wound margin is distinct with the outline attached to the wound base. There is no granulation within the wound bed. There is a large (67-100%) amount of necrotic tissue within the wound bed including Eschar and Adherent Slough. Wound #3 status is Open. Original cause of wound was Gradually Appeared. The date acquired was: 08/04/2021. The wound has been in treatment 6 weeks. The wound is located on the Left,Lateral Foot. The wound measures 1cm length x 2cm width x 0.5cm depth; 1.571cm^2 area and 0.785cm^3 volume. There is a medium amount of serosanguineous drainage noted. The wound margin is distinct with the outline attached to the wound base. There is no granulation within the wound bed. There is a large (67-100%) amount  of necrotic tissue within the wound bed including Eschar and Adherent Slough. 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 There was a Excisional Skin/Subcutaneous Tissue Debridement with a total area of 17.22 sq cm performed by Ricard Dillon., MD. With the following instrument(s): Curette to remove Viable and Non-Viable tissue/material. Material removed includes Callus, Subcutaneous Tissue, Skin: Dermis, and Skin: Epidermis after achieving pain control using Lidocaine. No specimens were taken. A time out was conducted at 11:35, prior to the start of the procedure. A Minimum amount of bleeding was controlled with Pressure. The procedure was tolerated well with a pain level of 0 throughout and a pain level of 0 following the procedure. Post Debridement Measurements: 4.2cm length x 4.1cm width x 1.2cm depth; 16.229cm^3 volume. Character of Wound/Ulcer Post Debridement is improved. Severity of Tissue Post Debridement is: Fat layer exposed. 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 There was a Excisional Skin/Subcutaneous Tissue Debridement with a total area of 2 sq cm performed by Ricard Dillon., MD. With the following instrument(s): Curette to remove Viable and Non-Viable tissue/material. Material removed includes Callus, Subcutaneous  Tissue, Skin: Dermis, and Skin: Epidermis after achieving pain control using Lidocaine. No specimens were taken. A time out was conducted at 11:35, prior to the start of the procedure. A Minimum amount of bleeding was controlled with Pressure. The procedure was tolerated well with a pain level of 0 throughout and a pain level of 0 following the procedure. Post Debridement Measurements: 1cm length x 2cm width x 0.5cm depth; 0.785cm^3 volume. Character of Wound/Ulcer Post Debridement is improved. Severity of Tissue Post Debridement is: Fat layer exposed. Post procedure Diagnosis Wound #3: Same as Pre-Procedure Plan Follow-up Appointments: Return Appointment in 2 weeks. Other: - I am calling Dr. Kennon Holter office today to set up an appt. Bathing/ Shower/ Hygiene: May shower with protection but do not get wound dressing(s) wet. Edema Control - Lymphedema / SCD / Other: Elevate legs to the level of the heart or above for 30 minutes daily and/or when sitting, a frequency of: Avoid standing for long periods of time. Off-Loading: Other: - Offloading boot to left foot; float heel/foot when sitting/laying down. Use pillow and place under your leg so your heel isnt laying against bed!!!! Home Health: New wound care orders this week; continue Home Health for wound care. May utilize formulary equivalent dressing for wound treatment orders unless otherwise specified. - [place] to change 2-3 x a wk WOUND #2: - Calcaneus Wound Laterality: Left Cleanser: Normal Saline ([place]) 1 x Per Day/30 Days Discharge Instructions: Cleanse the wound with Normal Saline prior to applying a clean dressing using gauze sponges, not tissue or cotton balls. Cleanser: Soap and Water [facility]) 1 x Per Day/30 Days Discharge Instructions: May shower and wash wound with dial antibacterial soap and water prior  to dressing change. Cleanser: Wound Cleanser (Home Health) 1 x Per Day/30 Days Discharge Instructions:  Cleanse the wound with wound cleanser prior to applying a clean dressing using gauze sponges, not tissue or cotton balls. Prim Dressing: 1/4 strength Dakin's wet to dry (Home Health) 1 x Per Day/30 Days ary Discharge Instructions: apply Dakin's wet to dry apply directly to wound bed. Secondary Dressing: Woven Gauze Sponge, Non-Sterile 4x4 in (Home Health) 1 x Per Day/30 Days Discharge Instructions: Apply over primary dressing as directed. Secondary Dressing: ABD Pad, 5x9 (Home Health) 1 x Per Day/30 Days Discharge Instructions: Apply over primary dressing as directed. Secured With: [employer], 4.5x3.1 (in/yd) (Home Health) 1 x Per Day/30 Days Discharge Instructions: Secure with Kerlix as directed. Secured With: 45M Medipore H Soft Cloth Surgical T ape, 4 x 10 (in/yd) (Home Health) 1 x Per Day/30 Days Discharge Instructions: Secure with tape as directed. WOUND #3: - Foot Wound Laterality: Left, Lateral Cleanser: Normal Saline (Home Health) 1 x Per Day/30 Days Discharge Instructions: Cleanse the wound with Normal Saline prior to applying a clean dressing using gauze sponges, not tissue or cotton balls. Cleanser: Soap and Water [facility]) 1 x Per Day/30 Days Discharge Instructions: May shower and wash wound with dial antibacterial soap and water prior to dressing change. Cleanser: Wound Cleanser (Home Health) 1 x Per Day/30 Days Discharge Instructions: Cleanse the wound with wound cleanser prior to applying a clean dressing using gauze sponges, not tissue or cotton balls. Prim Dressing: 1/4 strength Dakin's wet to dry (Home Health) 1 x Per Day/30 Days ary Discharge Instructions: apply Dakin's wet to dry apply directly to wound bed. Secondary Dressing: Woven Gauze Sponge, Non-Sterile 4x4 in (Home Health) 1 x Per Day/30 Days Discharge Instructions: Apply over primary dressing as directed. Secondary Dressing: ABD Pad, 5x9 (Home Health) 1 x Per Day/30 Days Discharge Instructions: Apply  over primary dressing as directed. Secured With: [employer], 4.5x3.1 (in/yd) (Home Health) 1 x Per Day/30 Days Discharge Instructions: Secure with Kerlix as directed. Secured With: 45M Medipore H Soft Cloth Surgical T ape, 4 x 10 (in/yd) (Home Health) 1 x Per Day/30 Days Discharge Instructions: Secure with tape as directed. 1. I continued with the Dakin's wet-to-dry change daily 2. I am not sure why they did not look into the patient's arterial insufficiency in the left leg while she was in hospital she is going to need some form of angiogram to save her leg. If they do not use standard contrast then CO2 angiography and if they do use standard contrast then it have to be prepared for urgent dialysis if she goes into acute renal failure 3. The wound on the heel although it is large and deep actually looks some better. 4. I am going to communicate with Dr. Gwenlyn Found to see if he will see her. Perhaps he feels that noninvasive studies need to be repeated. The patient has been clear that she would agree to dialysis if that was necessary. This was the original reason that her arterial insufficiency was not addressed during the hospitalization in October 5. I think the osteomyelitis is a soft call here. And I am not sure why they would not given her antibiotics. It would be possible to do a bone biopsy and culture Electronic Signature(s) Signed: 11/16/2021 4:34:24 PM By: Linton Ham MD Entered By: Linton Ham on 11/16/2021 12:15:49 -------------------------------------------------------------------------------- SuperBill Details Patient Name: Date of Service: Nancy Nancy Aly H. 11/16/2021 Medical Record Number: [id number]  Patient Account Number: [id number] Date of Birth/Sex: Treating RN: [id number] (60 y.o. 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 Facility Procedures CPT4 Code: [id number] Description: 61950 - DEB SUBQ TISSUE 20 SQ CM/< ICD-10 Diagnosis Description L97.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

## 2021-11-16 NOTE — Progress Notes (Signed)
MALLORI, ARAQUE ([id number]) Visit Report for 11/16/2021 Arrival Information Details Patient Name: Date of Service: [name] 11/16/2021 10:30 A M Medical Record Number: [id number] Patient Account Number: [id number] Date of Birth/Sex: Treating RN: 24-Jun-1961 (60 y.o. [name], [name] Primary Care Raylynne Cubbage: [name] Other Clinician: Referring Brodin Gelpi: Treating Zimere Dunlevy/Extender: Nyra Market in Treatment: 6 Visit Information History Since Last Visit Added or deleted any medications: No Patient Arrived: Wheel Chair Any new allergies or adverse reactions: No Arrival Time: 10:36 Had a fall or experienced change in No Accompanied By: husband activities of daily living that may affect Transfer Assistance: Manual risk of falls: Patient Identification Verified: Yes Signs or symptoms of abuse/neglect since last visito No Secondary Verification Process Completed: Yes Hospitalized since last visit: No Patient Requires Transmission-Based Precautions: No Implantable device outside of the clinic excluding No Patient Has Alerts: Yes cellular tissue based products placed in the center Patient Alerts: ABI's: 09/22 R:0.45 L0.5 since last visit: Has Dressing in Place as Prescribed: Yes Pain Present Now: No Electronic Signature(s) Signed: 11/16/2021 3:12:15 PM By: Sandre Kitty Entered By: Sandre Kitty on 11/16/2021 10:37:44 -------------------------------------------------------------------------------- Encounter Discharge Information Details Patient Name: Date of Service: [name] 11/16/2021 10:30 A M Medical Record Number: [id number] Patient Account Number: [id number] Date of Birth/Sex: Treating RN: 1961-03-02 (60 y.o. Tonita Phoenix, Lauren Primary Care Deveion Denz: [name] Other Clinician: Referring Rhodie Cienfuegos: Treating Enoc Getter/Extender: Nyra Market in Treatment: 6 Encounter  Discharge Information Items Post Procedure Vitals Discharge Condition: Stable Temperature (F): 97.4 Ambulatory Status: Wheelchair Pulse (bpm): 98 Discharge Destination: Home Respiratory Rate (breaths/min): 17 Transportation: Private Auto Blood Pressure (mmHg): 147/74 Accompanied By: husband Schedule Follow-up Appointment: Yes Clinical Summary of Care: Patient Declined Electronic Signature(s) Signed: 11/16/2021 5:20:51 PM By: Rhae Hammock RN Entered By: Rhae Hammock on 11/16/2021 11:55:34 -------------------------------------------------------------------------------- Lower Extremity Assessment Details Patient Name: Date of Service: [name] 11/16/2021 10:30 A M Medical Record Number: [id number] Patient Account Number: [id number] Date of Birth/Sex: Treating RN: Sep 16, 1961 (60 y.o. Tonita Phoenix, Lauren Primary Care Belva Koziel: [name] Other Clinician: Referring Davita Sublett: Treating Ellamae Lybeck/Extender: Nyra Market in Treatment: 6 Edema Assessment Assessed: Shirlyn Goltz: No] Patrice Paradise: No] Edema: [Left: Ye] [Right: s] Calf Left: Right: Point of Measurement: 33 cm From Medial Instep 45 cm Ankle Left: Right: Point of Measurement: 10 cm From Medial Instep 30 cm Electronic Signature(s) Signed: 11/16/2021 5:20:51 PM By: Rhae Hammock RN Entered By: Rhae Hammock on 11/16/2021 11:30:48 -------------------------------------------------------------------------------- Multi Wound Chart Details Patient Name: Date of Service: [name] 11/16/2021 10:30 A M Medical Record Number: [id number] Patient Account Number: [id number] Date of Birth/Sex: Treating RN: [birth year] (60 y.o. [name], Meta.Reding Primary Care Anicka Stuckert: [name] Other Clinician: Referring Caelan Atchley: Treating Kanchan Gal/Extender: Nyra Market in Treatment: 6 Vital Signs Height(in): 63 Capillary Blood Glucose(mg/dl):  178 Weight(lbs): 232 Pulse(bpm): 20 Body Mass Index(BMI): 5 Blood Pressure(mmHg): 130/73 Temperature(F): 98.5 Respiratory Rate(breaths/min): 17 Photos: Left, Dorsal Foot Left Calcaneus Left, Lateral Foot Wound Location: Gradually Appeared Gradually Appeared Gradually Appeared Wounding Event: Diabetic Wound/Ulcer of the Lower Diabetic Wound/Ulcer of the Lower Diabetic Wound/Ulcer of the Lower Primary Etiology: Extremity Extremity Extremity Congestive Heart Failure, Congestive Heart Failure, Congestive Heart Failure, Comorbid History: Hypertension, Peripheral Arterial Hypertension, Peripheral Arterial Hypertension, Peripheral Arterial Disease, Type II Diabetes, Dementia, Disease, Type II Diabetes, Dementia, Disease, Type II Diabetes, Dementia, Neuropathy Neuropathy Neuropathy 09/04/2021 08/04/2021 08/04/2021  Date Acquired: 6 6 6  Weeks of Treatment: Open Open Open Wound Status: 0x0x0 4.2x4.1x1.2 1x2x0.5 Measurements L x W x D (cm) 0 13.525 1.571 A (cm) : rea 0 16.229 0.785 Volume (cm) : 100.00% 40.60% 68.70% % Reduction in A rea: 100.00% -612.40% -56.10% % Reduction in Volume: Grade 2 Grade 1 Grade 1 Classification: Medium Large Medium Exudate A mount: Serosanguineous Serosanguineous Serosanguineous Exudate Type: red, brown red, brown red, brown Exudate Color: Distinct, outline attached Distinct, outline attached Distinct, outline attached Wound Margin: Medium (34-66%) None Present (0%) None Present (0%) Granulation A mount: Medium (34-66%) Large (67-100%) Large (67-100%) Necrotic A mount: Adherent Slough Eschar, Adherent Slough Eschar, Adherent Slough Necrotic Tissue: Fat Layer (Subcutaneous Tissue): Yes Fascia: No Fascia: No Exposed Structures: Fascia: No Fat Layer (Subcutaneous Tissue): No Fat Layer (Subcutaneous Tissue): No Tendon: No Tendon: No Tendon: No Muscle: No Muscle: No Muscle: No Joint: No Joint: No Joint: No Bone: No Bone: No Bone:  No None Small (1-33%) None Epithelialization: N/A Debridement - Excisional Debridement - Excisional Debridement: Pre-procedure Verification/Time Out N/A 11:35 11:35 Taken: N/A Lidocaine Lidocaine Pain Control: N/A Callus, Subcutaneous Callus, Subcutaneous Tissue Debrided: N/A Skin/Subcutaneous Tissue Skin/Subcutaneous Tissue Level: N/A 17.22 2 Debridement A (sq cm): rea N/A Curette Curette Instrument: N/A Minimum Minimum Bleeding: N/A Pressure Pressure Hemostasis A chieved: N/A 0 0 Procedural Pain: N/A 0 0 Post Procedural Pain: N/A Procedure was tolerated well Procedure was tolerated well Debridement Treatment Response: N/A 4.2x4.1x1.2 1x2x0.5 Post Debridement Measurements L x W x D (cm) N/A 16.229 0.785 Post Debridement Volume: (cm) N/A Debridement Debridement Procedures Performed: Treatment Notes Wound #1 (Foot) Wound Laterality: Dorsal, Left Cleanser Peri-Wound Care Topical Primary Dressing Secondary Dressing Secured With Compression Wrap Compression Stockings Add-Ons Wound #2 (Calcaneus) Wound Laterality: Left Cleanser Normal Saline Discharge Instruction: Cleanse the wound with Normal Saline prior to applying a clean dressing using gauze sponges, not tissue or cotton balls. Soap and Water Discharge Instruction: May shower and wash wound with dial antibacterial soap and water prior to dressing change. Wound Cleanser Discharge Instruction: Cleanse the wound with wound cleanser prior to applying a clean dressing using gauze sponges, not tissue or cotton balls. Peri-Wound Care Topical Primary Dressing 1/4 strength Dakin's wet to dry Discharge Instruction: apply Dakin's wet to dry apply directly to wound bed. Secondary Dressing Woven Gauze Sponge, Non-Sterile 4x4 in Discharge Instruction: Apply over primary dressing as directed. ABD Pad, 5x9 Discharge Instruction: Apply over primary dressing as directed. Secured With [employer], 4.5x3.1  (in/yd) Discharge Instruction: Secure with Kerlix as directed. 34M Medipore H Soft Cloth Surgical T ape, 4 x 10 (in/yd) Discharge Instruction: Secure with tape as directed. Compression Wrap Compression Stockings Add-Ons Wound #3 (Foot) Wound Laterality: Left, Lateral Cleanser Normal Saline Discharge Instruction: Cleanse the wound with Normal Saline prior to applying a clean dressing using gauze sponges, not tissue or cotton balls. Soap and Water Discharge Instruction: May shower and wash wound with dial antibacterial soap and water prior to dressing change. Wound Cleanser Discharge Instruction: Cleanse the wound with wound cleanser prior to applying a clean dressing using gauze sponges, not tissue or cotton balls. Peri-Wound Care Topical Primary Dressing 1/4 strength Dakin's wet to dry Discharge Instruction: apply Dakin's wet to dry apply directly to wound bed. Secondary Dressing Woven Gauze Sponge, Non-Sterile 4x4 in Discharge Instruction: Apply over primary dressing as directed. ABD Pad, 5x9 Discharge Instruction: Apply over primary dressing as directed. Secured With [employer], 4.5x3.1 (in/yd) Discharge Instruction: Secure with Kerlix as  directed. 67M Medipore H Soft Cloth Surgical T ape, 4 x 10 (in/yd) Discharge Instruction: Secure with tape as directed. Compression Wrap Compression Stockings Add-Ons Electronic Signature(s) Signed: 11/16/2021 4:34:24 PM By: Linton Ham MD Signed: 11/16/2021 5:22:04 PM By: Deon Pilling RN, BSN Entered By: Linton Ham on 11/16/2021 11:56:20 -------------------------------------------------------------------------------- Multi-Disciplinary Care Plan Details Patient Name: Date of Service: [address] NICA H. 11/16/2021 10:30 A M Medical Record Number: [id number] Patient Account Number: [id number] Date of Birth/Sex: Treating RN: 07-22-1961 (60 y.o. Tonita Phoenix, Lauren Primary Care Rayvin Abid: [name] Other  Clinician: Referring Tyric Rodeheaver: Treating Ry Moody/Extender: Nyra Market in Treatment: 6 Active Inactive Wound/Skin Impairment Nursing Diagnoses: Impaired tissue integrity Knowledge deficit related to ulceration/compromised skin integrity Goals: Patient will have a decrease in wound volume by X% from date: (specify in notes) Date Initiated: 10/04/2021 Target Resolution Date: 12/15/2021 Goal Status: Active Patient/caregiver will verbalize understanding of skin care regimen Date Initiated: 10/04/2021 Target Resolution Date: 12/08/2021 Goal Status: Active Ulcer/skin breakdown will have a volume reduction of 30% by week 4 Date Initiated: 10/04/2021 Target Resolution Date: 12/14/2021 Goal Status: Active Interventions: Assess patient/caregiver ability to obtain necessary supplies Assess patient/caregiver ability to perform ulcer/skin care regimen upon admission and as needed Assess ulceration(s) every visit Notes: Electronic Signature(s) Signed: 11/16/2021 5:20:51 PM By: Rhae Hammock RN Entered By: Rhae Hammock on 11/16/2021 11:47:00 -------------------------------------------------------------------------------- Pain Assessment Details Patient Name: Date of Service: [name] 11/16/2021 10:30 A M Medical Record Number: [id number] Patient Account Number: [id number] Date of Birth/Sex: Treating RN: 1961-10-20 (60 y.o. [name] Primary Care Evoleth Nordmeyer: [name] Other Clinician: Referring Nalani Andreen: Treating Pinky Ravan/Extender: Nyra Market in Treatment: 6 Active Problems Location of Pain Severity and Description of Pain Patient Has Paino No Site Locations Pain Management and Medication Current Pain Management: Electronic Signature(s) Signed: 11/16/2021 3:12:15 PM By: Sandre Kitty Signed: 11/16/2021 5:22:04 PM By: Deon Pilling RN, BSN Entered By: Sandre Kitty on 11/16/2021  10:38:20 -------------------------------------------------------------------------------- Patient/Caregiver Education Details Patient Name: Date of Service: [name] 12/1/2022andnbsp10:30 [place] Record Number: [id number] Patient Account Number: [id number] Date of Birth/Gender: Treating RN: 12-03-1961 (60 y.o. Tonita Phoenix, Lauren Primary Care Physician: [name] Other Clinician: Referring Physician: Treating Physician/Extender: Nyra Market in Treatment: 6 Education Assessment Education Provided To: Patient Education Topics Provided Basic Hygiene: Methods: Explain/Verbal Responses: Reinforcements needed, State content correctly Electronic Signature(s) Signed: 11/16/2021 5:20:51 PM By: Rhae Hammock RN Entered By: Rhae Hammock on 11/16/2021 11:47:36 -------------------------------------------------------------------------------- Wound Assessment Details Patient Name: Date of Service: [name] 11/16/2021 10:30 A M Medical Record Number: [id number] Patient Account Number: [id number] Date of Birth/Sex: Treating RN: 10-16-1961 (60 y.o. [name], Meta.Reding Primary Care Linsey Arteaga: [name] Other Clinician: Referring Smaran Gaus: Treating Meka Lewan/Extender: Nyra Market in Treatment: 6 Wound Status Wound Number: 1 Primary Diabetic Wound/Ulcer of the Lower Extremity Etiology: Wound Location: Left, Dorsal Foot Wound Open Wounding Event: Gradually Appeared Status: Date Acquired: 09/04/2021 Comorbid Congestive Heart Failure, Hypertension, Peripheral Arterial Weeks Of Treatment: 6 History: Disease, Type II Diabetes, Dementia, Neuropathy Clustered Wound: No Photos Wound Measurements Length: (cm) 0 Width: (cm) 0 Depth: (cm) 0 Area: (cm) 0 Volume: (cm) 0 % Reduction in Area: 100% % Reduction in Volume: 100% Epithelialization: None Wound Description Classification:  Grade 2 Wound Margin: Distinct, outline attached Exudate Amount: Medium Exudate Type: Serosanguineous Exudate Color: red, brown Foul Odor After Cleansing: No Slough/Fibrino  Yes Wound Bed Granulation Amount: Medium (34-66%) Exposed Structure Necrotic Amount: Medium (34-66%) Fascia Exposed: No Necrotic Quality: Adherent Slough Fat Layer (Subcutaneous Tissue) Exposed: Yes Tendon Exposed: No Muscle Exposed: No Joint Exposed: No Bone Exposed: No Electronic Signature(s) Signed: 11/16/2021 3:12:15 PM By: Sandre Kitty Signed: 11/16/2021 5:22:04 PM By: Deon Pilling RN, BSN Entered By: Sandre Kitty on 11/16/2021 10:47:21 -------------------------------------------------------------------------------- Wound Assessment Details Patient Name: Date of Service: [name] 11/16/2021 10:30 A M Medical Record Number: [id number] Patient Account Number: [id number] Date of Birth/Sex: Treating RN: 12-May-1961 (60 y.o. [name] Primary Care Cordelro Gautreau: [name] Other Clinician: Referring Pauletta Pickney: Treating Arliss Hepburn/Extender: Nyra Market in Treatment: 6 Wound Status Wound Number: 2 Primary Diabetic Wound/Ulcer of the Lower Extremity Etiology: Wound Location: Left Calcaneus Wound Open Wounding Event: Gradually Appeared Status: Date Acquired: 08/04/2021 Comorbid Congestive Heart Failure, Hypertension, Peripheral Arterial Weeks Of Treatment: 6 History: Disease, Type II Diabetes, Dementia, Neuropathy Clustered Wound: No Photos Wound Measurements Length: (cm) 4.2 Width: (cm) 4.1 Depth: (cm) 1.2 Area: (cm) 13.525 Volume: (cm) 16.229 % Reduction in Area: 40.6% % Reduction in Volume: -612.4% Epithelialization: Small (1-33%) Wound Description Classification: Grade 1 Wound Margin: Distinct, outline attached Exudate Amount: Large Exudate Type: Serosanguineous Exudate Color: red, brown Foul Odor After Cleansing:  No Slough/Fibrino Yes Wound Bed Granulation Amount: None Present (0%) Exposed Structure Necrotic Amount: Large (67-100%) Fascia Exposed: No Necrotic Quality: Eschar, Adherent Slough Fat Layer (Subcutaneous Tissue) Exposed: No Tendon Exposed: No Muscle Exposed: No Joint Exposed: No Bone Exposed: No Treatment Notes Wound #2 (Calcaneus) Wound Laterality: Left Cleanser Normal Saline Discharge Instruction: Cleanse the wound with Normal Saline prior to applying a clean dressing using gauze sponges, not tissue or cotton balls. Soap and Water Discharge Instruction: May shower and wash wound with dial antibacterial soap and water prior to dressing change. Wound Cleanser Discharge Instruction: Cleanse the wound with wound cleanser prior to applying a clean dressing using gauze sponges, not tissue or cotton balls. Peri-Wound Care Topical Primary Dressing 1/4 strength Dakin's wet to dry Discharge Instruction: apply Dakin's wet to dry apply directly to wound bed. Secondary Dressing Woven Gauze Sponge, Non-Sterile 4x4 in Discharge Instruction: Apply over primary dressing as directed. ABD Pad, 5x9 Discharge Instruction: Apply over primary dressing as directed. Secured With [employer], 4.5x3.1 (in/yd) Discharge Instruction: Secure with Kerlix as directed. 29M Medipore H Soft Cloth Surgical T ape, 4 x 10 (in/yd) Discharge Instruction: Secure with tape as directed. Compression Wrap Compression Stockings Add-Ons Electronic Signature(s) Signed: 11/16/2021 3:12:15 PM By: Sandre Kitty Signed: 11/16/2021 5:22:04 PM By: Deon Pilling RN, BSN Entered By: Sandre Kitty on 11/16/2021 10:46:33 -------------------------------------------------------------------------------- Wound Assessment Details Patient Name: Date of Service: [name] 11/16/2021 10:30 A M Medical Record Number: [id number] Patient Account Number: [id number] Date of Birth/Sex: Treating RN: June 22, 1961  (60 y.o. [name], Meta.Reding Primary Care Makaylyn Sinyard: [name] Other Clinician: Referring Kiely Cousar: Treating Harveer Sadler/Extender: Nyra Market in Treatment: 6 Wound Status Wound Number: 3 Primary Diabetic Wound/Ulcer of the Lower Extremity Etiology: Wound Location: Left, Lateral Foot Wound Open Wounding Event: Gradually Appeared Status: Date Acquired: 08/04/2021 Comorbid Congestive Heart Failure, Hypertension, Peripheral Arterial Weeks Of Treatment: 6 History: Disease, Type II Diabetes, Dementia, Neuropathy Clustered Wound: No Photos Wound Measurements Length: (cm) 1 Width: (cm) 2 Depth: (cm) 0.5 Area: (cm) 1.571 Volume: (cm) 0.785 % Reduction in Area: 68.7% % Reduction in Volume: -56.1% Epithelialization: None Wound Description Classification: Grade 1  Wound Margin: Distinct, outline attached Exudate Amount: Medium Exudate Type: Serosanguineous Exudate Color: red, brown Foul Odor After Cleansing: No Slough/Fibrino Yes Wound Bed Granulation Amount: None Present (0%) Exposed Structure Necrotic Amount: Large (67-100%) Fascia Exposed: No Necrotic Quality: Eschar, Adherent Slough Fat Layer (Subcutaneous Tissue) Exposed: No Tendon Exposed: No Muscle Exposed: No Joint Exposed: No Bone Exposed: No Treatment Notes Wound #3 (Foot) Wound Laterality: Left, Lateral Cleanser Normal Saline Discharge Instruction: Cleanse the wound with Normal Saline prior to applying a clean dressing using gauze sponges, not tissue or cotton balls. Soap and Water Discharge Instruction: May shower and wash wound with dial antibacterial soap and water prior to dressing change. Wound Cleanser Discharge Instruction: Cleanse the wound with wound cleanser prior to applying a clean dressing using gauze sponges, not tissue or cotton balls. Peri-Wound Care Topical Primary Dressing 1/4 strength Dakin's wet to dry Discharge Instruction: apply Dakin's wet to dry apply  directly to wound bed. Secondary Dressing Woven Gauze Sponge, Non-Sterile 4x4 in Discharge Instruction: Apply over primary dressing as directed. ABD Pad, 5x9 Discharge Instruction: Apply over primary dressing as directed. Secured With [employer], 4.5x3.1 (in/yd) Discharge Instruction: Secure with Kerlix as directed. 85M Medipore H Soft Cloth Surgical T ape, 4 x 10 (in/yd) Discharge Instruction: Secure with tape as directed. Compression Wrap Compression Stockings Add-Ons Electronic Signature(s) Signed: 11/16/2021 3:12:15 PM By: Sandre Kitty Signed: 11/16/2021 5:22:04 PM By: Deon Pilling RN, BSN Entered By: Sandre Kitty on 11/16/2021 10:45:40 -------------------------------------------------------------------------------- Vitals Details Patient Name: Date of Service: [name] 11/16/2021 10:30 A M Medical Record Number: [id number] Patient Account Number: [id number] Date of Birth/Sex: Treating RN: [birth year] (60 y.o. [name], Meta.Reding Primary Care Audy Dauphine: [name] Other Clinician: Referring Wilder Amodei: Treating Daysean Tinkham/Extender: Nyra Market in Treatment: 6 Vital Signs Time Taken: 10:37 Temperature (F): 98.5 Height (in): 63 Pulse (bpm): 74 Weight (lbs): 232 Respiratory Rate (breaths/min): 17 Body Mass Index (BMI): 41.1 Blood Pressure (mmHg): 130/73 Capillary Blood Glucose (mg/dl): 178 Reference Range: 80 - 120 mg / dl Electronic Signature(s) Signed: 11/16/2021 3:12:15 PM By: Sandre Kitty Entered By: Sandre Kitty on 11/16/2021 10:38:12

## 2021-11-16 NOTE — Progress Notes (Signed)
  DUPLICATE NOTE.      ACTION: Next visit planned for NEXT WEEK.

## 2021-11-17 ENCOUNTER — Encounter (HOSPITAL_BASED_OUTPATIENT_CLINIC_OR_DEPARTMENT_OTHER): Payer: MEDICAID | Admitting: Internal Medicine

## 2021-11-17 ENCOUNTER — Other Ambulatory Visit: Payer: Self-pay

## 2021-11-17 DIAGNOSIS — I739 Peripheral vascular disease, unspecified: Secondary | ICD-10-CM

## 2021-11-17 NOTE — Progress Notes (Signed)
Left message for pt to call back  °

## 2021-11-20 ENCOUNTER — Ambulatory Visit: Payer: Medicaid Other | Admitting: Internal Medicine

## 2021-11-20 ENCOUNTER — Other Ambulatory Visit (HOSPITAL_COMMUNITY): Payer: Self-pay | Admitting: Cardiovascular Disease

## 2021-11-20 DIAGNOSIS — I739 Peripheral vascular disease, unspecified: Secondary | ICD-10-CM

## 2021-11-22 ENCOUNTER — Ambulatory Visit (HOSPITAL_COMMUNITY): Admission: RE | Admit: 2021-11-22 | Payer: Medicaid Other | Source: Ambulatory Visit

## 2021-11-22 ENCOUNTER — Ambulatory Visit: Payer: Medicaid Other | Admitting: Cardiovascular Disease

## 2021-11-22 ENCOUNTER — Other Ambulatory Visit: Payer: Self-pay

## 2021-11-22 ENCOUNTER — Telehealth: Payer: Self-pay

## 2021-11-22 ENCOUNTER — Encounter: Payer: Self-pay | Admitting: Cardiovascular Disease

## 2021-11-22 ENCOUNTER — Ambulatory Visit (INDEPENDENT_AMBULATORY_CARE_PROVIDER_SITE_OTHER): Payer: Medicaid Other | Admitting: Cardiovascular Disease

## 2021-11-22 ENCOUNTER — Other Ambulatory Visit (HOSPITAL_COMMUNITY): Payer: Self-pay

## 2021-11-22 VITALS — BP 155/83 | HR 83 | Ht 63.0 in | Wt 242.0 lb

## 2021-11-22 DIAGNOSIS — I739 Peripheral vascular disease, unspecified: Secondary | ICD-10-CM | POA: Diagnosis not present

## 2021-11-22 DIAGNOSIS — I5033 Acute on chronic diastolic (congestive) heart failure: Secondary | ICD-10-CM

## 2021-11-22 DIAGNOSIS — I2510 Atherosclerotic heart disease of native coronary artery without angina pectoris: Secondary | ICD-10-CM

## 2021-11-22 DIAGNOSIS — I70223 Atherosclerosis of native arteries of extremities with rest pain, bilateral legs: Secondary | ICD-10-CM | POA: Diagnosis not present

## 2021-11-22 NOTE — Progress Notes (Signed)
11/22/2021 [name]   Jan 04, 1961  [id number]  Primary Physician [name], MD Primary Cardiologist: [name] MD [name], [place]  HPI:  Nancy [name] is a 60 y.o.  moderate to severely overweight married African-American female mother of 1 child with no grandchildren who does not work and was referred by her primary care provider, Carrolyn Meiers PA-C for evaluation of dyspnea on exertion and orthopnea.  She is accompanied by [name] today.  I last saw her in the office 07/07/2021.. She does have a brief history of tobacco abuse having begun smoking at age 22 and stopped 4 months ago.  She has treated hypertension and diabetes and untreated hyperlipidemia.  She does have a family history of heart disease with a mother that had myocardial infarction in brother who has had stents.  She apparently had asymptomatic strokes found on CT but has never been clinically diagnosed with 1.  She is never had a heart attack.  She denies chest pain but does admit to dyspnea on exertion has been getting progressively worse as well as bilateral lower extremity edema for which she was prescribed furosemide.  She also still describes symptoms compatible with orthopnea.  She had blood work performed by her PCP that showed elevated BNP in the 300 range.     I did get a 2D echocardiogram on her 04/05/2021 that showed normal LV systolic function, grade 2 diastolic dysfunction and no valvular abnormalities.  I did increase her furosemide to 20 mg a day which did not substantively change her renal function.  Last creatinine was 1.76.  Carotid Dopplers performed 03/10/2021 showed an occluded right internal carotid artery with moderate left ICA stenosis.  I performed right and left heart cath on her 04/27/2021 that showed nonobstructive CAD with an LVEDP of 33 and high wedge and RA pressures.  She was on torsemide and has been moderate renal insufficiency.  She was admitted to the  hospital in late June for 5 days for volume overload and IV diuresis.  Since being discharged has had progressive weight gain, progressive edema, dyspnea and orthopnea.    Since I saw her in the office 5 months ago she has developed nonhealing wounds on her left leg.  She has seen Dr. Dellia Nims at the wound care center who has been treating her does not feel that they are healing at an appropriate rate.  She did have ABIs performed 09/05/2021 that were in the 0.5 range bilaterally.  She had an AV fistula placed in anticipation of hemodialysis with serum creatinines in the mid 3 range.  Dr. Dellia Nims feels that she would benefit from angiography and potential intervention for limb salvage.   Current Meds  Medication Sig   acetaminophen (TYLENOL) 500 MG tablet Take 500 mg by mouth every 6 (six) hours as needed for moderate pain or headache.   amLODipine (NORVASC) 10 MG tablet Take 1 tablet (10 mg total) by mouth daily.   aspirin 81 MG EC tablet Take 1 tablet (81 mg total) by mouth daily.   atorvastatin (LIPITOR) 40 MG tablet Take 1 tablet (40 mg total) by mouth daily.   Blood Glucose Monitoring Suppl (ONETOUCH VERIO) w/Device KIT Check blood sugar three times daily. E11.40   calcium acetate (PHOSLO) 667 MG capsule Take 1 capsule (667 mg total) by mouth 3 (three) times daily with meals.   carvedilol (COREG) 12.5 MG tablet Take 1.5 tablets (18.75 mg total) by mouth 2 (two)  times daily with a meal.   Continuous Blood Gluc Receiver (DEXCOM G6 RECEIVER) DEVI 1 Device by Does not apply route daily.   Continuous Blood Gluc Sensor (DEXCOM G6 SENSOR) MISC 1 packet by Does not apply route daily.   Continuous Blood Gluc Transmit (DEXCOM G6 TRANSMITTER) MISC 1 packet by Does not apply route daily.   ferrous sulfate 325 (65 FE) MG tablet Take 1 tablet (325 mg total) by mouth daily with breakfast.   glucose blood test strip Use as instructedCheck blood sugar three times daily. E11.40   insulin glargine (LANTUS) 100  UNIT/ML Solostar Pen Inject 30 Units into the skin at bedtime.   insulin lispro (HUMALOG KWIKPEN) 100 UNIT/ML KwikPen Inject 4 units before breakfast and dinner.   Insulin Pen Needle 32G X 4 MM MISC Use to inject insulin as directed.   risperiDONE (RISPERDAL) 3 MG tablet TAKE 1 TABLET (3 MG TOTAL) BY MOUTH AT BEDTIME.   torsemide (DEMADEX) 20 MG tablet Take 3 tablets (60 mg total) by mouth every morning AND 2 tablets (40 mg total) every evening.     Allergies  Allergen Reactions   Elemental Sulfur Hives and Other (See Comments)    PATIENT STATED THIS, MORE THAN LIKELY, SHOULD HAVE BEEN LOGGED AS "SULFA"   Hydralazine Hcl Other (See Comments)    Hair loss   Hydrocodone Itching and Other (See Comments)    Upset stomach   Metformin And Related Nausea And Vomiting and Other (See Comments)    Stomach pains, also   Other Nausea Only and Other (See Comments)    Lettuce- Does not digest this!!   Plaquenil [Hydroxychloroquine Sulfate] Hives   Shellfish-Derived Products Nausea Only and Other (See Comments)    Caused an upset stomach   Shrimp (Diagnostic) Nausea Only and Other (See Comments)    Upset stomach    Sulfa Antibiotics Hives    Social History   Socioeconomic History   Marital status: Legally Separated    Spouse name: [name]   Number of children: 1   Years of education: some colle   Highest education level: Not on file  Occupational History   Occupation: Employed FT as Landscape architect    Comment: [employer] , NA  Tobacco Use   Smoking status: Former    Packs/day: 0.25    Years: 0.50    Pack years: 0.13    Types: Cigarettes   Smokeless tobacco: Never   Tobacco comments:    Former smoker 11/03/21  Vaping Use   Vaping Use: Never used  Substance and Sexual Activity   Alcohol use: No   Drug use: No   Sexual activity: Yes    Birth control/protection: None  Other Topics Concern   Not on file  Social History Narrative   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    Social Determinants of Health   Financial Resource Strain: Low Risk    Difficulty of Paying Living Expenses: Not very hard  Food Insecurity: No Food Insecurity   Worried About Charity fundraiser in the Last Year: Never true   Ran Out of Food in the Last Year: Never true  Transportation Needs: No Transportation Needs   Lack of Transportation (Medical): No   Lack of Transportation (Non-Medical): No  Physical Activity: Not on file  Stress: Not on file  Social Connections: Not on file  Intimate Partner Violence: Not on file     Review of Systems: General: negative for chills,  fever, night sweats or weight changes.  Cardiovascular: negative for chest pain, dyspnea on exertion, edema, orthopnea, palpitations, paroxysmal nocturnal dyspnea or shortness of breath Dermatological: negative for rash Respiratory: negative for cough or wheezing Urologic: negative for hematuria Abdominal: negative for nausea, vomiting, diarrhea, bright red blood per rectum, melena, or hematemesis Neurologic: negative for visual changes, syncope, or dizziness All other systems reviewed and are otherwise negative except as noted above.    Blood pressure (!) 155/83, pulse 83, height $RemoveBe'5\' 3"'TsLiFYfYW$  (1.6 m), weight 242 lb (109.8 kg), SpO2 95 %.  General appearance: alert and no distress Neck: no adenopathy, no carotid bruit, no JVD, supple, symmetrical, trachea midline, and thyroid not enlarged, symmetric, no tenderness/mass/nodules Lungs: clear to auscultation bilaterally Heart: regular rate and rhythm, S1, S2 normal, no murmur, click, rub or gallop Extremities: 2+ lower extreme edema.  Her left leg is wrapped and in a boot. Pulses: Pedal pulses not assessed Skin: Skin color, texture, turgor normal. No rashes or lesions Neurologic: Grossly normal  EKG not performed today  ASSESSMENT AND PLAN:   Critical limb ischemia of both lower extremities (HCC) Ms. Shimizu returns today at the request  of Dr. Dellia Nims, her wound care physician, for consideration of vascular intervention.  She did have a right foot wound which has healed.  She has had wounds on her left foot for last 4 months which according to the patient and her husband are healing nicely.  She had Dopplers performed 09/05/2021 that revealed ABIs in the 0.5 range bilaterally.  She is minimally ambulatory and has CKD with a AV fistula recently placed in anticipation of dialysis.  Given the fact that Dr. Dellia Nims thought that the potential for osteomyelitis was "soft", and the fact that its healing according to the patient and her husband I do not feel compelled to proceed with angiography and intervention.  If I did, I suspect I would cause her kidneys to shut down and her would accelerate her need for dialysis.  I did speak to Dr. Dellia Nims today who felt that she would benefit from angiography and I discussed this with the patient and her husband.  Acute on chronic diastolic CHF (congestive heart failure) ([place]) 2D echo performed 10/06/2021 showed preserved LV function with grade 2 diastolic dysfunction.  She is on torsemide.  Coronary artery disease involving native coronary artery of native heart without angina pectoris I performed right and left heart cath on her 04/27/2021 that showed nonobstructive disease with elevated LVEDP.     [name] MD FACP,FACC,FAHA, [facility] 11/22/2021 11:44 AM

## 2021-11-22 NOTE — Assessment & Plan Note (Signed)
2D echo performed 10/06/2021 showed preserved LV function with grade 2 diastolic dysfunction.  She is on torsemide.

## 2021-11-22 NOTE — Progress Notes (Signed)
Paramedicine Encounter    Patient ID: Nancy [name], female    DOB: [birth year], 60 y.o.   MRN: [id number]  PARAMEDICINE MED REC VISIT-    Met with Nancy [name] in her apartment today where she reports feeling okay stating she was seen by Dr. Gwenlyn [name] earlier today. She stated she was not short of breath, denied chest pain but stated her legs are continuing to swell. Pitting edema noted to both lower legs. Left foot wrapped and in boot.   Medications reviewed and confirmed.  Pill box filled accordingly.  Nancy [name] has not yet received her Dexcom and is not checking her sugar currently but is still giving her self her insulin.   [place] husband Nancy [name] was inquiring about extra assistance in the home to help care for Nancy [name] while he is at work. I explained PCS services. I will discuss with Nancy [name]. At [facility] clinic.   I will discuss with Nancy [name] about getting her Dexcom delivered asap.   Appointments reviewed and written for patient's husband.   Home visit complete.   Todays weight- 242 at Dr. Naida [name] Office.   REFILLS: NONE    Patient Care Team: [name], MD as PCP - General (Internal Medicine) [name], MD as PCP - Cardiology (Cardiology) Larey Dresser, MD as PCP - Advanced Heart Failure (Cardiology)  Patient Active Problem List   Diagnosis Date Noted   Critical limb ischemia of both lower extremities ([place]) 11/22/2021   Osteomyelitis ([place])    Peripheral artery disease ([place]) 10/27/2021   Acute exacerbation of congestive heart failure ([place]) 10/24/2021   Acute cardiogenic pulmonary edema ([place]) 10/06/2021   Coronary artery disease involving native coronary artery of native heart without angina pectoris 10/06/2021   Goals of care, counseling/discussion    Gangrene of left foot ([place])    Cellulitis of left foot    Diabetic ulcer of left foot ([place]) 09/04/2021   CHF (congestive heart failure) ([place]) 07/07/2021   Normocytic anemia 06/30/2021   CKD (chronic  kidney disease), stage IV ([place]) 06/11/2021   Acute on chronic diastolic CHF (congestive heart failure) ([place]) 06/10/2021   Abnormal nuclear stress test    Dyspnea on exertion 03/07/2021   Orthopnea 02/25/2021   History of cerebrovascular accident (CVA) with residual deficit 05/06/2020   Diabetic peripheral neuropathy ([place]) 04/26/2020   AKI (acute kidney injury) ([place]) 04/20/2020   Generalized weakness 04/20/2020   Dehydration 04/20/2020   Generalized abdominal pain    Pancreatitis, acute 02/29/2020   Cerebrovascular accident (CVA) ([place]) 11/08/2019   Stenosis of right carotid artery 11/08/2019   Mixed diabetic hyperlipidemia associated with type 2 diabetes mellitus ([place]) 11/08/2019   Cortical age-related cataract of both eyes 08/30/2019   Gait abnormality 08/30/2019   Statin declined 08/30/2019   Gastroesophageal reflux disease without esophagitis 04/18/2018   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   Type 2 diabetes mellitus with stage 4 chronic kidney disease, with long-term current use of insulin ([place]) 10/29/2017   History of macular degeneration 10/29/2017   Chronic cervical pain 10/29/2016   Myalgia 09/14/2016   Insomnia 09/14/2016   Tinea pedis of both feet 08/20/2016   Macular degeneration 08/17/2016   Low back pain 09/21/2014   Hypertensive urgency 09/21/2014   Diabetic gastroparesis associated with type 2 diabetes mellitus ([place]) 09/14/2014   Uncontrolled hypertension 09/08/2014   Thyroid nodule 08/17/2014   Obesity (BMI 30-39.9) 08/17/2014   Nausea & vomiting 08/04/2014   Type II diabetes mellitus with neurological manifestations, uncontrolled 08/04/2014  Current Outpatient Medications:    acetaminophen (TYLENOL) 500 MG tablet, Take 500 mg by mouth every 6 (six) hours as needed for moderate pain or headache., Disp: , Rfl:    amLODipine (NORVASC) 10 MG tablet, Take 1 tablet (10 mg total) by mouth daily., Disp: 30 tablet, Rfl: 6   aspirin 81 MG EC tablet, Take 1 tablet (81 mg total) by  mouth daily., Disp: 60 tablet, Rfl: 1   atorvastatin (LIPITOR) 40 MG tablet, Take 1 tablet (40 mg total) by mouth daily., Disp: 30 tablet, Rfl: 6   Blood Glucose Monitoring Suppl (ONETOUCH VERIO) w/Device KIT, Check blood sugar three times daily. E11.40, Disp: 1 kit, Rfl: 0   calcium acetate (PHOSLO) 667 MG capsule, Take 1 capsule (667 mg total) by mouth 3 (three) times daily with meals., Disp: 90 capsule, Rfl: 3   carvedilol (COREG) 12.5 MG tablet, Take 1.5 tablets (18.75 mg total) by mouth 2 (two) times daily with a meal., Disp: 90 tablet, Rfl: 6   Continuous Blood Gluc Receiver (DEXCOM G6 RECEIVER) DEVI, 1 Device by Does not apply route daily., Disp: 1 each, Rfl: 0   Continuous Blood Gluc Sensor (DEXCOM G6 SENSOR) MISC, 1 packet by Does not apply route daily., Disp: 3 each, Rfl: 11   Continuous Blood Gluc Transmit (DEXCOM G6 TRANSMITTER) MISC, 1 packet by Does not apply route daily., Disp: 1 each, Rfl: 4   ferrous sulfate 325 (65 FE) MG tablet, Take 1 tablet (325 mg total) by mouth daily with breakfast., Disp: 100 tablet, Rfl: 1   glucose blood test strip, Use as instructedCheck blood sugar three times daily. E11.40, Disp: 100 each, Rfl: 12   insulin glargine (LANTUS) 100 UNIT/ML Solostar Pen, Inject 30 Units into the skin at bedtime., Disp: 15 mL, Rfl: 3   insulin lispro (HUMALOG KWIKPEN) 100 UNIT/ML KwikPen, Inject 4 units before breakfast and dinner., Disp: 15 mL, Rfl: 3   Insulin Pen Needle 32G X 4 MM MISC, Use to inject insulin as directed., Disp: 100 each, Rfl: 4   metolazone (ZAROXOLYN) 2.5 MG tablet, Take 1 tablet (2.5 mg total) by mouth once for 1 dose., Disp: 1 tablet, Rfl: 0   risperiDONE (RISPERDAL) 3 MG tablet, TAKE 1 TABLET (3 MG TOTAL) BY MOUTH AT BEDTIME., Disp: 30 tablet, Rfl: 2   torsemide (DEMADEX) 20 MG tablet, Take 3 tablets (60 mg total) by mouth every morning AND 2 tablets (40 mg total) every evening., Disp: 150 tablet, Rfl: 6 Allergies  Allergen Reactions   Elemental  Sulfur Hives and Other (See Comments)    PATIENT STATED THIS, MORE THAN LIKELY, SHOULD HAVE BEEN LOGGED AS "SULFA"   Hydralazine Hcl Other (See Comments)    Hair loss   Hydrocodone Itching and Other (See Comments)    Upset stomach   Metformin And Related Nausea And Vomiting and Other (See Comments)    Stomach pains, also   Other Nausea Only and Other (See Comments)    Lettuce- Does not digest this!!   Plaquenil [Hydroxychloroquine Sulfate] Hives   Shellfish-Derived Products Nausea Only and Other (See Comments)    Caused an upset stomach   Shrimp (Diagnostic) Nausea Only and Other (See Comments)    Upset stomach    Sulfa Antibiotics Hives     Social History   Socioeconomic History   Marital status: Legally Separated    Spouse name: Nancy [name]   Number of children: 1   Years of education: some colle   Highest education level: Not on  file  Occupational History   Occupation: Employed FT as Landscape architect    Comment: [employer] , NA  Tobacco Use   Smoking status: Former    Packs/day: 0.25    Years: 0.50    Pack years: 0.13    Types: Cigarettes   Smokeless tobacco: Never   Tobacco comments:    Former smoker 11/03/21  Vaping Use   Vaping Use: Never used  Substance and Sexual Activity   Alcohol use: No   Drug use: No   Sexual activity: Yes    Birth control/protection: None  Other Topics Concern   Not on file  Social History Narrative   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    Social Determinants of Health   Financial Resource Strain: Low Risk    Difficulty of Paying Living Expenses: Not very hard  Food Insecurity: No Food Insecurity   Worried About Charity fundraiser in the Last Year: Never true   Ran Out of Food in the Last Year: Never true  Transportation Needs: No Transportation Needs   Lack of Transportation (Medical): No   Lack of Transportation (Non-Medical): No  Physical Activity: Not on file  Stress: Not on file  Social  Connections: Not on file  Intimate Partner Violence: Not on file    Physical Exam      Future Appointments  Date Time Provider [place]  11/29/2021 10:00 AM [facility] HS VASC 3 - EM [facility] VVS  11/29/2021 10:30 AM [facility] HS VASC 3 - EM [facility]I VVS  11/29/2021 11:00 AM Waynetta Sandy, MD [facility]  11/30/2021 10:45 AM Ricard Dillon, MD [facility]  12/04/2021 10:00 AM Laurice Record, MD [facility]  12/08/2021  1:00 PM MC-CV NL VASC 4 [facility]-SE[facility]I [facility]  01/05/2022 12:00 PM Larey Dresser, MD MC-[facility]SC None  05/18/2022 11:15 AM [name], MD [facility]     ACTION: Home visit completed

## 2021-11-22 NOTE — Assessment & Plan Note (Addendum)
Ms. Villagomez returns today at the request of Dr. Dellia Nims, her wound care physician, for consideration of vascular intervention.  She did have a right foot wound which has healed.  She has had wounds on her left foot for last 4 months which according to the patient and her husband are healing nicely.  She had Dopplers performed 09/05/2021 that revealed ABIs in the 0.5 range bilaterally.  She is minimally ambulatory and has CKD with a AV fistula recently placed in anticipation of dialysis.  Given the fact that Dr. Dellia Nims thought that the potential for osteomyelitis was "soft", and the fact that its healing according to the patient and her husband I do not feel compelled to proceed with angiography and intervention.  If I did, I suspect I would cause her kidneys to shut down and her would accelerate her need for dialysis.  I did speak to Dr. Dellia Nims today who felt that she would benefit from angiography and I discussed this with the patient and her husband.

## 2021-11-22 NOTE — Assessment & Plan Note (Signed)
I performed right and left heart cath on her 04/27/2021 that showed nonobstructive disease with elevated LVEDP.

## 2021-11-22 NOTE — Telephone Encounter (Signed)
Call received from Salena Saner, EMT requesting [facility] referral for patient.  Her husband is working 1500-2300 daily and is her primary caregiver. He would appreciate any assistance that is available. Explained to Nira Conn that a PCS request can be submitted if Dr Wynetta Emery is in agreement.

## 2021-11-22 NOTE — Patient Instructions (Signed)
Medication Instructions:  Your physician recommends that you continue on your current medications as directed. Please refer to the Current Medication list given to you today.  *If you need a refill on your cardiac medications before your next appointment, please call your pharmacy*   Testing/Procedures: Your physician has requested that you have a lower extremity arterial duplex. This test is an ultrasound of the arteries in the legs. It looks at arterial blood flow in the legs. Allow one hour for Lower Arterial scans. There are no restrictions or special instructions.   Your physician has requested that you have an ankle brachial index (ABI). During this test an ultrasound and blood pressure cuff are used to evaluate the arteries that supply the arms and legs with blood. Allow thirty minutes for this exam. There are no restrictions or special instructions.  These procedures are done at [facility].   Follow-Up: At [facility], you and your health needs are our priority.  As part of our continuing mission to provide you with exceptional heart care, we have created designated Provider Care Teams.  These Care Teams include your primary Cardiologist (physician) and Advanced Practice Providers (APPs -  Physician Assistants and Nurse Practitioners) who all work together to provide you with the care you need, when you need it.  We recommend signing up for the patient portal called "MyChart".  Sign up information is provided on this After Visit Summary.  MyChart is used to connect with patients for Virtual Visits (Telemedicine).  Patients are able to view lab/test results, encounter notes, upcoming appointments, etc.  Non-urgent messages can be sent to your provider as well.   To learn more about what you can do with MyChart, go to [email or web address].    Your next appointment:   6 month(s)  The format for your next appointment:   In Person  Provider:   Quay Burow, MD

## 2021-11-23 NOTE — Telephone Encounter (Signed)
Completed PCS referral faxed to [facility] °

## 2021-11-28 ENCOUNTER — Telehealth (HOSPITAL_COMMUNITY): Payer: Self-pay

## 2021-11-28 NOTE — Telephone Encounter (Signed)
Received call from Mrs. Wee inquiring about her Dexcom. I was able to talk with [place] who reports they have it ready. I picked up same and will deliver it out tomorrow during home visit.   Mrs. Mazzaferro also complained of some shortness of breath. No increased swelling or work of breathing per [name]. Weight today 237lbs last weight was 242lbs. She understands if symptoms worsen to call 911. She did not feel it was needed at this time. I will see her in the home tomorrow and she will be seen by V&V also in office tomorrow morning. Call complete.

## 2021-11-29 ENCOUNTER — Other Ambulatory Visit (HOSPITAL_COMMUNITY): Payer: Self-pay

## 2021-11-29 ENCOUNTER — Other Ambulatory Visit: Payer: Self-pay

## 2021-11-29 ENCOUNTER — Encounter: Payer: Self-pay | Admitting: Vascular Surgery

## 2021-11-29 ENCOUNTER — Ambulatory Visit (INDEPENDENT_AMBULATORY_CARE_PROVIDER_SITE_OTHER)
Admit: 2021-11-29 | Discharge: 2021-11-29 | Disposition: A | Discharge status: Home / Self Care | Payer: Medicaid Other | Attending: Vascular Surgery | Admitting: Vascular Surgery

## 2021-11-29 ENCOUNTER — Ambulatory Visit (HOSPITAL_COMMUNITY)
Admission: RE | Admit: 2021-11-29 | Discharge: 2021-11-29 | Disposition: A | Discharge status: Home / Self Care | Payer: Medicaid Other | Source: Ambulatory Visit | Attending: Vascular Surgery | Admitting: Vascular Surgery

## 2021-11-29 ENCOUNTER — Ambulatory Visit (INDEPENDENT_AMBULATORY_CARE_PROVIDER_SITE_OTHER): Payer: Medicaid Other | Admitting: Vascular Surgery

## 2021-11-29 VITALS — BP 155/77 | Temp 98.4°F | Resp 20 | Ht 63.0 in | Wt 242.0 lb

## 2021-11-29 DIAGNOSIS — N184 Chronic kidney disease, stage 4 (severe): Secondary | ICD-10-CM

## 2021-11-29 NOTE — H&P (View-Only) (Signed)
Patient ID: Nancy [name], female   DOB: November 25, 1961, 60 y.o.   MRN: [id number]  Reason for Consult: New Patient (Initial Visit)   Referred by [name], MD  Subjective:     HPI:  Nancy [name] is a 60 y.o. female with CKD stage IV.  She has never been on dialysis has not had family members on dialysis.  She has diabetes and hypertension.  She does have a history of a stroke which prevents her from walking much but she does walk with help of a cane.  Today she is in a wheelchair.  She does have a left foot wound followed by Dr. Sharol Given.  She is right-hand dominant.  She is never had any upper extremity, chest or breast surgery in the past.  She denies any previous pacemaker placement reports.  Past Medical History:  Diagnosis Date   Anemia 2006   Depression 2014   previously on amitryptiline    Diabetes mellitus with neurological manifestation ([place]) 2006   Diabetic peripheral neuropathy ([place]) 04/26/2020   Fracture of left ankle 1997    Gastroparesis 07/2016   HOH (hard of hearing) 2004    Hyperlipidemia 2006   Hypertension 2006   IBS (irritable bowel syndrome) 2002    Leukopenia 2015    Macular degeneration 11/2019   Shingles 2009    Stroke [facility]) 2020   Thyroid nodule 2004   Family History  Problem Relation Age of Onset   Hypertension Mother    Heart disease Mother    Diabetes Mother    Thyroid disease Mother    Congestive Heart Failure Mother    Breast cancer Maternal Grandmother    Colon cancer Maternal Grandfather    Heart attack Sister    Heart disease Brother    Hyperlipidemia Brother    Hypertension Brother    Diabetes Father    Breast cancer Maternal Aunt    Past Surgical History:  Procedure Laterality Date   ABDOMINAL HYSTERECTOMY  2005   CATARACT EXTRACTION Left 11/2019   CESAREAN SECTION  1983    CHOLECYSTECTOMY N/A 03/05/2020   Procedure: LAPAROSCOPIC CHOLECYSTECTOMY WITH INTRAOPERATIVE CHOLANGIOGRAM;  Surgeon: Donnie Mesa, MD;   Location: [facility];  Service: General;  Laterality: N/A;   ESOPHAGOGASTRODUODENOSCOPY (EGD) WITH PROPOFOL Left 08/26/2014   Procedure: ESOPHAGOGASTRODUODENOSCOPY (EGD) WITH PROPOFOL;  Surgeon: Arta Silence, MD;  Location: [facility];  Service: Endoscopy;  Laterality: Left;   ESOPHAGOGASTRODUODENOSCOPY (EGD) WITH PROPOFOL N/A 03/03/2020   Procedure: ESOPHAGOGASTRODUODENOSCOPY (EGD) WITH PROPOFOL;  Surgeon: Jerene Bears, MD;  [facility]: [facility];  Service: Gastroenterology;  Laterality: N/A;   RIGHT HEART CATH N/A 07/14/2021   Procedure: RIGHT HEART CATH;  Surgeon: Larey Dresser, MD;  Location: [place] [facility];  Service: Cardiovascular;  Laterality: N/A;   RIGHT/LEFT HEART CATH AND CORONARY ANGIOGRAPHY N/A 04/27/2021   Procedure: RIGHT/LEFT HEART CATH AND CORONARY ANGIOGRAPHY;  Surgeon: [name], MD;  Location: [place] [facility];  Service: Cardiovascular;  Laterality: N/A;    Short Social History:  Social History   Tobacco Use   Smoking status: Former    Packs/day: 0.25    Years: 0.50    Pack years: 0.13    Types: Cigarettes   Smokeless tobacco: Never   Tobacco comments:    Former smoker 11/03/21  Substance Use Topics   Alcohol use: No    Allergies  Allergen Reactions   Elemental Sulfur Hives and Other (See Comments)    PATIENT STATED THIS,  MORE THAN LIKELY, SHOULD HAVE BEEN LOGGED AS "SULFA"   Hydralazine Hcl Other (See Comments)    Hair loss   Hydrocodone Itching and Other (See Comments)    Upset stomach   Metformin And Related Nausea And Vomiting and Other (See Comments)    Stomach pains, also   Other Nausea Only and Other (See Comments)    Lettuce- Does not digest this!!   Plaquenil [Hydroxychloroquine Sulfate] Hives   Shellfish-Derived Products Nausea Only and Other (See Comments)    Caused an upset stomach   Shrimp (Diagnostic) Nausea Only and Other (See Comments)    Upset stomach    Sulfa Antibiotics Hives    Current Outpatient Medications   Medication Sig Dispense Refill   acetaminophen (TYLENOL) 500 MG tablet Take 500 mg by mouth every 6 (six) hours as needed for moderate pain or headache.     amLODipine (NORVASC) 10 MG tablet Take 1 tablet (10 mg total) by mouth daily. 30 tablet 6   aspirin 81 MG EC tablet Take 1 tablet (81 mg total) by mouth daily. 60 tablet 1   atorvastatin (LIPITOR) 40 MG tablet Take 1 tablet (40 mg total) by mouth daily. 30 tablet 6   Blood Glucose Monitoring Suppl (ONETOUCH VERIO) w/Device KIT Check blood sugar three times daily. E11.40 1 kit 0   calcium acetate (PHOSLO) 667 MG capsule Take 1 capsule (667 mg total) by mouth 3 (three) times daily with meals. 90 capsule 3   carvedilol (COREG) 12.5 MG tablet Take 1.5 tablets (18.75 mg total) by mouth 2 (two) times daily with a meal. 90 tablet 6   Continuous Blood Gluc Receiver (DEXCOM G6 RECEIVER) DEVI 1 Device by Does not apply route daily. 1 each 0   Continuous Blood Gluc Sensor (DEXCOM G6 SENSOR) MISC 1 packet by Does not apply route daily. 3 each 11   Continuous Blood Gluc Transmit (DEXCOM G6 TRANSMITTER) MISC 1 packet by Does not apply route daily. 1 each 4   ferrous sulfate 325 (65 FE) MG tablet Take 1 tablet (325 mg total) by mouth daily with breakfast. 100 tablet 1   glucose blood test strip Use as instructedCheck blood sugar three times daily. E11.40 100 each 12   insulin glargine (LANTUS) 100 UNIT/ML Solostar Pen Inject 30 Units into the skin at bedtime. 15 mL 3   insulin lispro (HUMALOG KWIKPEN) 100 UNIT/ML KwikPen Inject 4 units before breakfast and dinner. 15 mL 3   Insulin Pen Needle 32G X 4 MM MISC Use to inject insulin as directed. 100 each 4   risperiDONE (RISPERDAL) 3 MG tablet TAKE 1 TABLET (3 MG TOTAL) BY MOUTH AT BEDTIME. 30 tablet 2   torsemide (DEMADEX) 20 MG tablet Take 3 tablets (60 mg total) by mouth every morning AND 2 tablets (40 mg total) every evening. 150 tablet 6   metolazone (ZAROXOLYN) 2.5 MG tablet Take 1 tablet (2.5 mg total)  by mouth once for 1 dose. 1 tablet 0   No current facility-administered medications for this visit.    Review of Systems  Constitutional:  Constitutional negative. HENT: HENT negative.  Eyes: Eyes negative.  Respiratory: Respiratory negative.  Cardiovascular: Positive for leg swelling.  GI: Gastrointestinal negative.  Musculoskeletal: Musculoskeletal negative.  Skin: Positive for wound.  Hematologic: Hematologic/lymphatic negative.  Psychiatric: Psychiatric negative.       Objective:  Objective   Vitals:   11/29/21 1040  BP: (!) 155/77  Resp: 20  Temp: 98.4 F (36.9 C)  Weight: 242  lb (109.8 kg)  Height: $Remove'5\' 3"'yKcUzBC$  (1.6 m)   Body mass index is 42.87 kg/m.  Physical Exam HENT:     Head: Normocephalic.     Nose:     Comments: Wearing a mask Eyes:     Pupils: Pupils are equal, round, and reactive to light.  Cardiovascular:     Pulses:          Radial pulses are 1+ on the right side and 1+ on the left side.  Pulmonary:     Effort: Pulmonary effort is normal.  Abdominal:     General: Abdomen is flat.     Palpations: Abdomen is soft.  Musculoskeletal:     Cervical back: Normal range of motion.     Comments: Wearing a left leg boot  Skin:    Capillary Refill: Capillary refill takes less than 2 seconds.  Neurological:     General: No focal deficit present.     Mental Status: She is alert.  Psychiatric:        Mood and Affect: Mood normal.        Thought Content: Thought content normal.    Data: I have independently interpreted her preop venous ultrasound for dialysis access.  She appears to have suitable basilic vein on the right no other suitable cephalic or basilic veins in the upper extremities.  Bilateral brachial arteries measure 0.3 cm and are triphasic waveforms.     Assessment/Plan:     61 year old female with CKD 4.  She is here to discuss permanent dialysis access having never had access before.  I discussed with her the options being catheter, graft,  fistula.  She is right-hand dominant but appears to have suitable vein in the right upper extremity.  She does not take any blood thinners.  We will get her set up for right upper extremity fistula versus graft in the near future.  I discussed the risk benefits and alternatives specifically the risk of steal and primary nonfunction given her small arteries in bilateral upper extremities.  She demonstrates good understanding in the presence of her significant other.     Waynetta Sandy MD [facility]

## 2021-11-29 NOTE — Progress Notes (Signed)
Patient ID: Nancy [name], female   DOB: Mar 22, 1961, 60 y.o.   MRN: [id number]  Reason for Consult: New Patient (Initial Visit)   Referred by [name], MD  Subjective:     HPI:  Nancy [name] is a 60 y.o. female with CKD stage IV.  She has never been on dialysis has not had family members on dialysis.  She has diabetes and hypertension.  She does have a history of a stroke which prevents her from walking much but she does walk with help of a cane.  Today she is in a wheelchair.  She does have a left foot wound followed by Dr. Sharol Given.  She is right-hand dominant.  She is never had any upper extremity, chest or breast surgery in the past.  She denies any previous pacemaker placement reports.  Past Medical History:  Diagnosis Date   Anemia 2006   Depression 2014   previously on amitryptiline    Diabetes mellitus with neurological manifestation ([place]) 2006   Diabetic peripheral neuropathy ([place]) 04/26/2020   Fracture of left ankle 1997    Gastroparesis 07/2016   HOH (hard of hearing) 2004    Hyperlipidemia 2006   Hypertension 2006   IBS (irritable bowel syndrome) 2002    Leukopenia 2015    Macular degeneration 11/2019   Shingles 2009    Stroke [facility]) 2020   Thyroid nodule 2004   Family History  Problem Relation Age of Onset   Hypertension Mother    Heart disease Mother    Diabetes Mother    Thyroid disease Mother    Congestive Heart Failure Mother    Breast cancer Maternal Grandmother    Colon cancer Maternal Grandfather    Heart attack Sister    Heart disease Brother    Hyperlipidemia Brother    Hypertension Brother    Diabetes Father    Breast cancer Maternal Aunt    Past Surgical History:  Procedure Laterality Date   ABDOMINAL HYSTERECTOMY  2005   CATARACT EXTRACTION Left 11/2019   CESAREAN SECTION  1983    CHOLECYSTECTOMY N/A 03/05/2020   Procedure: LAPAROSCOPIC CHOLECYSTECTOMY WITH INTRAOPERATIVE CHOLANGIOGRAM;  Surgeon: Donnie Mesa, MD;   [facility];  Service: General;  Laterality: N/A;   ESOPHAGOGASTRODUODENOSCOPY (EGD) WITH PROPOFOL Left 08/26/2014   Procedure: ESOPHAGOGASTRODUODENOSCOPY (EGD) WITH PROPOFOL;  Surgeon: Arta Silence, MD;  [facility];  Service: Endoscopy;  Laterality: Left;   ESOPHAGOGASTRODUODENOSCOPY (EGD) WITH PROPOFOL N/A 03/03/2020   Procedure: ESOPHAGOGASTRODUODENOSCOPY (EGD) WITH PROPOFOL;  Surgeon: Jerene Bears, MD;  [facility];  Service: Gastroenterology;  Laterality: N/A;   RIGHT HEART CATH N/A 07/14/2021   Procedure: RIGHT HEART CATH;  Surgeon: Larey Dresser, MD;  Location: [place] [facility];  Service: Cardiovascular;  Laterality: N/A;   RIGHT/LEFT HEART CATH AND CORONARY ANGIOGRAPHY N/A 04/27/2021   Procedure: RIGHT/LEFT HEART CATH AND CORONARY ANGIOGRAPHY;  Surgeon: [name], MD;  Location: [place] [facility];  Service: Cardiovascular;  Laterality: N/A;    Short Social History:  Social History   Tobacco Use   Smoking status: Former    Packs/day: 0.25    Years: 0.50    Pack years: 0.13    Types: Cigarettes   Smokeless tobacco: Never   Tobacco comments:    Former smoker 11/03/21  Substance Use Topics   Alcohol use: No    Allergies  Allergen Reactions   Elemental Sulfur Hives and Other (See Comments)    PATIENT STATED THIS,  MORE THAN LIKELY, SHOULD HAVE BEEN LOGGED AS "SULFA"   Hydralazine Hcl Other (See Comments)    Hair loss   Hydrocodone Itching and Other (See Comments)    Upset stomach   Metformin And Related Nausea And Vomiting and Other (See Comments)    Stomach pains, also   Other Nausea Only and Other (See Comments)    Lettuce- Does not digest this!!   Plaquenil [Hydroxychloroquine Sulfate] Hives   Shellfish-Derived Products Nausea Only and Other (See Comments)    Caused an upset stomach   Shrimp (Diagnostic) Nausea Only and Other (See Comments)    Upset stomach    Sulfa Antibiotics Hives    Current Outpatient Medications   Medication Sig Dispense Refill   acetaminophen (TYLENOL) 500 MG tablet Take 500 mg by mouth every 6 (six) hours as needed for moderate pain or headache.     amLODipine (NORVASC) 10 MG tablet Take 1 tablet (10 mg total) by mouth daily. 30 tablet 6   aspirin 81 MG EC tablet Take 1 tablet (81 mg total) by mouth daily. 60 tablet 1   atorvastatin (LIPITOR) 40 MG tablet Take 1 tablet (40 mg total) by mouth daily. 30 tablet 6   Blood Glucose Monitoring Suppl (ONETOUCH VERIO) w/Device KIT Check blood sugar three times daily. E11.40 1 kit 0   calcium acetate (PHOSLO) 667 MG capsule Take 1 capsule (667 mg total) by mouth 3 (three) times daily with meals. 90 capsule 3   carvedilol (COREG) 12.5 MG tablet Take 1.5 tablets (18.75 mg total) by mouth 2 (two) times daily with a meal. 90 tablet 6   Continuous Blood Gluc Receiver (DEXCOM G6 RECEIVER) DEVI 1 Device by Does not apply route daily. 1 each 0   Continuous Blood Gluc Sensor (DEXCOM G6 SENSOR) MISC 1 packet by Does not apply route daily. 3 each 11   Continuous Blood Gluc Transmit (DEXCOM G6 TRANSMITTER) MISC 1 packet by Does not apply route daily. 1 each 4   ferrous sulfate 325 (65 FE) MG tablet Take 1 tablet (325 mg total) by mouth daily with breakfast. 100 tablet 1   glucose blood test strip Use as instructedCheck blood sugar three times daily. E11.40 100 each 12   insulin glargine (LANTUS) 100 UNIT/ML Solostar Pen Inject 30 Units into the skin at bedtime. 15 mL 3   insulin lispro (HUMALOG KWIKPEN) 100 UNIT/ML KwikPen Inject 4 units before breakfast and dinner. 15 mL 3   Insulin Pen Needle 32G X 4 MM MISC Use to inject insulin as directed. 100 each 4   risperiDONE (RISPERDAL) 3 MG tablet TAKE 1 TABLET (3 MG TOTAL) BY MOUTH AT BEDTIME. 30 tablet 2   torsemide (DEMADEX) 20 MG tablet Take 3 tablets (60 mg total) by mouth every morning AND 2 tablets (40 mg total) every evening. 150 tablet 6   metolazone (ZAROXOLYN) 2.5 MG tablet Take 1 tablet (2.5 mg total)  by mouth once for 1 dose. 1 tablet 0   No current facility-administered medications for this visit.    Review of Systems  Constitutional:  Constitutional negative. HENT: HENT negative.  Eyes: Eyes negative.  Respiratory: Respiratory negative.  Cardiovascular: Positive for leg swelling.  GI: Gastrointestinal negative.  Musculoskeletal: Musculoskeletal negative.  Skin: Positive for wound.  Hematologic: Hematologic/lymphatic negative.  Psychiatric: Psychiatric negative.       Objective:  Objective   Vitals:   11/29/21 1040  BP: (!) 155/77  Resp: 20  Temp: 98.4 F (36.9 C)  Weight: 242  lb (109.8 kg)  Height: $Remove'5\' 3"'lyeEQpW$  (1.6 m)   Body mass index is 42.87 kg/m.  Physical Exam HENT:     Head: Normocephalic.     Nose:     Comments: Wearing a mask Eyes:     Pupils: Pupils are equal, round, and reactive to light.  Cardiovascular:     Pulses:          Radial pulses are 1+ on the right side and 1+ on the left side.  Pulmonary:     Effort: Pulmonary effort is normal.  Abdominal:     General: Abdomen is flat.     Palpations: Abdomen is soft.  Musculoskeletal:     Cervical back: Normal range of motion.     Comments: Wearing a left leg boot  Skin:    Capillary Refill: Capillary refill takes less than 2 seconds.  Neurological:     General: No focal deficit present.     Mental Status: She is alert.  Psychiatric:        Mood and Affect: Mood normal.        Thought Content: Thought content normal.    Data: I have independently interpreted her preop venous ultrasound for dialysis access.  She appears to have suitable basilic vein on the right no other suitable cephalic or basilic veins in the upper extremities.  Bilateral brachial arteries measure 0.3 cm and are triphasic waveforms.     Assessment/Plan:     60 year old female with CKD 4.  She is here to discuss permanent dialysis access having never had access before.  I discussed with her the options being catheter, graft,  fistula.  She is right-hand dominant but appears to have suitable vein in the right upper extremity.  She does not take any blood thinners.  We will get her set up for right upper extremity fistula versus graft in the near future.  I discussed the risk benefits and alternatives specifically the risk of steal and primary nonfunction given her small arteries in bilateral upper extremities.  She demonstrates good understanding in the presence of her significant other.     Waynetta Sandy MD [facility]

## 2021-11-29 NOTE — Progress Notes (Signed)
Paramedicine Encounter    Patient ID: Nancy [name], female    DOB: September 13, 1961, 60 y.o.   MRN: [id number]  Arrived for med rec for Nancy [name]. I reviewed meds and noted Nancy [name] successfully took all of her medications minus her bedtime Risperdone which she often forgets to take. I coached her on same. She agreed with plan.   Pill box filled accordingly for one week.   Dexcom was picked up by me at [place]. Today I reviewed this with Nancy [name] and placed same on patient.   CBG on Dexcom at 1650: 187   NO REFILLS NEEDED  Appointments reviewed and confirmed.  I will see her in one week.     ACTION: Home visit completed

## 2021-11-30 ENCOUNTER — Encounter (HOSPITAL_BASED_OUTPATIENT_CLINIC_OR_DEPARTMENT_OTHER): Payer: Medicaid Other | Admitting: Internal Medicine

## 2021-11-30 DIAGNOSIS — E11621 Type 2 diabetes mellitus with foot ulcer: Secondary | ICD-10-CM | POA: Diagnosis not present

## 2021-11-30 NOTE — Progress Notes (Signed)
MILILANI, MURTHY ([id number]) Visit Report for 11/30/2021 HPI Details Patient Name: Date of Service: [name] 11/30/2021 10:45 A M Medical Record Number: [id number] Patient Account Number: [id number] Date of Birth/Sex: Treating RN: 10/18/1961 (60 y.o. [name] Primary Care Provider: [name] Other Clinician: Referring Provider: Treating Provider/Extender: Nyra Market in Treatment: 8 History of Present Illness HPI Description: ADMISSION 10/04/2021 This is a 60 year old woman with multiple severe medical issues who arrives for review of wounds on her left foot. Recent medical history in epic shows a cellulitis of these left foot with gangrenous change and admission to hospital from 9/19 through 10/6. An MRI of the foot surprisingly did not show osteomyelitis. In the hospital she was treated with IV antibiotics and then discharged on Augmentin and doxycycline she has completed this. She also had noninvasive arterial studies that showed an ABI in the right of 0.45 on the left 0.46 monophasic waveforms bilaterally. They did not do TBI's. The patient was seen by Dr. Trula Slade in consult in the hospital. He felt she had evidence of arterial occlusive disease with an ABI on the left of 0.5 with monophasic waveforms at the ankle. Noted that any attempt at using dye in this patient would potentially push her into acute renal failure and with the patient who has been refusing dialysis this would be a fatal event. The patient saw palliative care but she was not interested in pursuing pure palliative care or SNF placement she has Medicaid pending but she does have home health The patient also was seen by nephrology in the hospital. She told them she did not want dialysis I think because her mother was on dialysis at 1 point. They did recommend a AV fistula. Again the patient refused. They noted deterioration from stage IV to stage V chronic  renal failure but they did not feel there was an urgent need for dialysis at that moment. The patient has a large boggy ischemic wound on most of her left heel. On the left lateral foot there is an open area with covering dry gangrene. On the dorsal foot just distal to the ankle there is a better looking wound surface that may have been a wrap injury at 1 point. The patient says that her heel and lateral foot have been there for about 2 months and the dorsal foot is uncertain. She has minimal tolerance with walking because of pain in her legs likely claudication. 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 Her discharge creatinine in the hospital was 2.75 with an estimated GFR of 19. It was much higher than this on presentation. Both vascular and Ortho recommended an amputation which the patient refused 11/3; since the patient was last here she was hospitalized for 5 days from 10/05/2021 through 10/10/2021 this was because of acute on chronic congestive heart failure. Post diuresis she was felt to have chronic kidney disease stage IV. Nephrology was consulted during the original admission and felt there was no indication for starting dialysis while an inpatient. Apparently has a follow-up with nephrologist Dr. Hollie Salk in 2 or 3 months. She did not see a vascular during this hospitalization although she previously saw Dr. Trula Slade at a point in time where she was apparently refusing dialysis although I have said previously I do not think she really understood what she was being told We have been using Betadine on the necrotic wounds  on the heel and the lateral foot and silver collagen on the dorsal foot wound on the left she does not have any wounds on the right. She comes in today with a necrotic eschar on the heel and the lateral foot separating visibly. 12/1; since the patient was  last here almost a month ago she was hospitalized from 10/24/2021 through 11/01/2021. 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 They correctly pointed out that her MRI was contraindicated but did not do anything beyond this. Orthopedic surgeons recommended an amputation which I am not sure that the patient agreed to. Also saw infectious disease who confirmed that "antibiotic treatment is not curative" she also had acute on chronic diastolic heart failure and now they are saying her chronic kidney disease stage IV is actually improved. I am not sure why they would not have addressed her vascular issues in the hospital. I looked back on her original arterial studies in September. These showed monophasic waveforms at the PTA and dorsalis pedis on the left with a ABI of 0.50. They are still using Dakin's wet-to-dry. 12/15Since the patient was last here I had a conversation with Dr. Gwenlyn Found. He is going to go ahead with arterial Dopplers on the left leg to see if he can determine the level of obstruction accounting for the ABI of 0.5 during her initial hospitalization. I feel that if there is some target that we can look at it and the risk is acceptable to go ahead and do angiography on her. She understands that this very well could throw her into dialysis dependent chronic renal failure. I previously discussed this at length with her husband and her Electronic Signature(s) Signed: 11/30/2021 4:39:12 PM By: Linton Ham MD Entered By: Linton Ham on 11/30/2021 12:15:00 -------------------------------------------------------------------------------- Physical Exam Details Patient Name: Date of Service: [name] 11/30/2021 10:45 A M Medical Record Number: [id number] Patient Account Number: [id number] Date of Birth/Sex: Treating RN: 1961/05/14 (60 y.o. [name] Primary Care Provider:  [name] Other Clinician: Referring Provider: Treating Provider/Extender: Nyra Market in Treatment: 8 Constitutional Sitting or standing Blood Pressure is within target range for patient.. Pulse regular and within target range for patient.Marland Kitchen Respirations regular, non-labored and within target range.. Temperature is normal and within the target range for the patient.Marland Kitchen Appears in no distress. Notes Wound exam; the plantar left heel wound now has a clean granulated base quite a bit different from when she first came in although this wound still has substantial depth it does not probe to bone. The area on the left lateral foot is a smaller wound still with some depth clean granulation. No evidence of infection bilaterally Electronic Signature(s) Signed: 11/30/2021 4:39:12 PM By: Linton Ham MD Entered By: Linton Ham on 11/30/2021 12:18:11 -------------------------------------------------------------------------------- Physician Orders Details Patient Name: Date of Service: [name] 11/30/2021 10:45 A M Medical Record Number: [id number] Patient Account Number: [id number] Date of Birth/Sex: Treating RN: [birth year] (60 y.o. 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 Bathing/ Shower/ Hygiene May shower with protection but do not get wound dressing(s) wet. Edema Control - Lymphedema / SCD / Other Elevate legs to the level of the heart or above for 30 minutes daily and/or when sitting, a frequency of: Avoid standing for long periods of time. Off-Loading Other: - Offloading boot to left foot; float heel/foot when sitting/laying down. Use pillow and place under your leg so your heel isnt laying against bed!!!! [place] wound care orders this week; continue Home Health for wound care. May utilize formulary equivalent dressing for wound treatment orders unless otherwise specified. - [place] to change 2-3 x a wk Wound Treatment Wound #2 - Calcaneus Wound Laterality: Left Cleanser: Normal Saline (Home Health) 1 x Per Day/30 Days Discharge Instructions: Cleanse the wound with Normal Saline prior to applying a clean dressing using gauze sponges, not tissue or cotton balls. Cleanser: Soap and Water [facility]) 1 x Per Day/30 Days Discharge Instructions: May shower and wash wound with dial antibacterial soap and water prior to dressing change. Cleanser: Wound Cleanser (Home Health) 1 x Per Day/30 Days Discharge Instructions: Cleanse the wound with wound cleanser prior to applying a clean dressing using gauze sponges, not tissue or cotton balls. Prim Dressing: 1/4 strength Dakin's wet to dry (Home Health) 1 x Per Day/30 Days ary Discharge Instructions: apply Dakin's wet to dry apply directly to wound bed. Secondary Dressing: Woven Gauze Sponge, Non-Sterile 4x4 in (Home Health) 1 x Per Day/30 Days Discharge Instructions: Apply over primary dressing as directed. Secondary Dressing: ABD Pad, 5x9 (Home Health) 1 x Per Day/30 Days Discharge Instructions: Apply over primary dressing as directed. Secured With: [employer], 4.5x3.1 (in/yd) (Home Health) 1 x Per Day/30 Days Discharge Instructions: Secure with Kerlix as  directed. Secured With: 42M Medipore H Soft Cloth Surgical T ape, 4 x 10 (in/yd) (Home Health) 1 x Per Day/30 Days Discharge Instructions: Secure with tape as directed. Wound #3 - Foot Wound Laterality: Left, Lateral Cleanser: Normal Saline (Home Health) 1 x Per Day/30 Days Discharge Instructions: Cleanse the wound with Normal Saline prior to applying a clean dressing using gauze sponges, not tissue or cotton balls. Cleanser: Soap and Water [facility]) 1 x Per Day/30 Days Discharge Instructions: May shower and wash wound with dial antibacterial soap and water prior to dressing change. Cleanser: Wound Cleanser (Home Health) 1 x Per Day/30 Days Discharge Instructions: Cleanse the wound with wound cleanser prior to applying a clean dressing using gauze sponges, not tissue or cotton balls. Prim Dressing: 1/4 strength Dakin's wet to dry (Home Health) 1 x Per Day/30 Days ary Discharge Instructions: apply Dakin's wet to dry apply directly to wound bed. Secondary Dressing: Woven Gauze Sponge, Non-Sterile 4x4 in (Home Health) 1 x Per Day/30 Days Discharge Instructions: Apply over primary dressing as directed. Secondary Dressing: ABD Pad, 5x9 (Home Health) 1 x Per Day/30 Days Discharge Instructions: Apply over primary dressing as directed. Secured With: [employer], 4.5x3.1 (in/yd) (Home Health) 1 x Per Day/30 Days Discharge Instructions: Secure with Kerlix as directed. Secured With: 42M Medipore H Soft Cloth Surgical T ape, 4 x 10 (in/yd) (Home Health) 1 x Per Day/30 Days Discharge Instructions: Secure with tape as directed. Electronic Signature(s) Signed: 11/30/2021 4:39:12 PM By: Dellia Nims,  Legrand Como MD Signed: 11/30/2021 5:32:07 PM By: Baruch Gouty RN, BSN Entered By: Baruch Gouty on 11/30/2021 11:52:26 -------------------------------------------------------------------------------- Problem List Details Patient Name: Date of Service: [name] 11/30/2021 10:45 A  M Medical Record Number: [id number] Patient Account Number: [id number] Date of Birth/Sex: Treating RN: December 16, 1961 (60 y.o. Martyn Malay, Linda Primary Care Provider: [name] Other Clinician: Referring Provider: Treating Provider/Extender: Nyra Market in Treatment: 8 Active Problems ICD-10 Encounter Code Description Active Date MDM Diagnosis E11.621 Type 2 diabetes mellitus with foot ulcer 10/04/2021 No Yes E11.52 Type 2 diabetes mellitus with diabetic peripheral angiopathy with gangrene 10/04/2021 No Yes L97.428 Non-pressure chronic ulcer of left heel and midfoot with other specified 10/04/2021 No Yes severity L97.528 Non-pressure chronic ulcer of other part of left foot with other specified 10/04/2021 No Yes severity I12.0 Hypertensive chronic kidney disease with stage 5 chronic kidney disease or 10/04/2021 No Yes end stage renal disease Inactive Problems Resolved Problems Electronic Signature(s) Signed: 11/30/2021 4:39:12 PM By: Linton Ham MD Entered By: Linton Ham on 11/30/2021 12:13:30 -------------------------------------------------------------------------------- Progress Note Details Patient Name: Date of Service: [name] 11/30/2021 10:45 A M Medical Record Number: [id number] Patient Account Number: [id number] Date of Birth/Sex: Treating RN: April 03, 1961 (60 y.o. [name] Primary Care Provider: [name] Other Clinician: Referring Provider: Treating Provider/Extender: Nyra Market in Treatment: 8 Subjective History of Present Illness (HPI) ADMISSION 10/04/2021 This is a 60 year old woman with multiple severe medical issues who arrives for review of wounds on her left foot. Recent medical history in epic shows a cellulitis of these left foot with gangrenous change and admission to hospital from 9/19 through 10/6. An MRI of the foot surprisingly did not  show osteomyelitis. In the hospital she was treated with IV antibiotics and then discharged on Augmentin and doxycycline she has completed this. She also had noninvasive arterial studies that showed an ABI in the right of 0.45 on the left 0.46 monophasic waveforms bilaterally. They did not do TBI's. The patient was seen by Dr. Trula Slade in consult in the hospital. He felt she had evidence of arterial occlusive disease with an ABI on the left of 0.5 with monophasic waveforms at the ankle. Noted that any attempt at using dye in this patient would potentially push her into acute renal failure and with the patient who has been refusing dialysis this would be a fatal event. The patient saw palliative care but she was not interested in pursuing pure palliative care or SNF placement she has Medicaid pending but she does have home health The patient also was seen by nephrology in the hospital. She told them she did not want dialysis I think because her mother was on dialysis at 1 point. They did recommend a AV fistula. Again the patient refused. They noted deterioration from stage IV to stage V chronic renal failure but they did not feel there was an urgent need for dialysis at that moment. The patient has a large boggy ischemic wound on most of her left heel. On the left lateral foot there is an open area with covering dry gangrene. On the dorsal foot just distal to the ankle there is a better looking wound surface that may have been a wrap injury at 1 point. The patient says that her heel and lateral foot have been there for about 2 months and the dorsal foot is uncertain. She has minimal tolerance with walking because of pain in  her legs likely claudication. 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 Her discharge creatinine in the hospital was 2.75 with an estimated GFR of 19. It was much  higher than this on presentation. Both vascular and Ortho recommended an amputation which the patient refused 11/3; since the patient was last here she was hospitalized for 5 days from 10/05/2021 through 10/10/2021 this was because of acute on chronic congestive heart failure. Post diuresis she was felt to have chronic kidney disease stage IV. Nephrology was consulted during the original admission and felt there was no indication for starting dialysis while an inpatient. Apparently has a follow-up with nephrologist Dr. Hollie Salk in 2 or 3 months. She did not see a vascular during this hospitalization although she previously saw Dr. Trula Slade at a point in time where she was apparently refusing dialysis although I have said previously I do not think she really understood what she was being told We have been using Betadine on the necrotic wounds on the heel and the lateral foot and silver collagen on the dorsal foot wound on the left she does not have any wounds on the right. She comes in today with a necrotic eschar on the heel and the lateral foot separating visibly. 12/1; since the patient was last here almost a month ago she was hospitalized from 10/24/2021 through 11/01/2021. 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 They correctly pointed out that her MRI was contraindicated but did not do anything beyond this. Orthopedic surgeons recommended an amputation which I am not sure that the patient agreed to. Also saw infectious disease who confirmed that "antibiotic treatment is not curative" she also had acute on chronic diastolic heart failure and now they are saying her chronic kidney disease stage IV is actually improved. I am not sure why they would not have addressed her vascular issues in the hospital. I looked back on her original arterial studies in September. These showed monophasic waveforms at the PTA and  dorsalis pedis on the left with a ABI of 0.50. They are still using Dakin's wet-to-dry. 12/15Since the patient was last here I had a conversation with Dr. Gwenlyn Found. He is going to go ahead with arterial Dopplers on the left leg to see if he can determine the level of obstruction accounting for the ABI of 0.5 during her initial hospitalization. I feel that if there is some target that we can look at it and the risk is acceptable to go ahead and do angiography on her. She understands that this very well could throw her into dialysis dependent chronic renal failure. I previously discussed this at length with her husband and her Objective Constitutional Sitting or standing Blood Pressure is within target range for patient.. Pulse regular and within target range for patient.Marland Kitchen Respirations regular, non-labored and within target range.. Temperature is normal and within the target range for the patient.Marland Kitchen Appears in no distress. Vitals Time Taken: 11:31 AM, Height: 63 in, Weight: 232 lbs, BMI: 41.1, Temperature: 98.8 F, Pulse: 74 bpm, Respiratory Rate: 17 breaths/min, Blood Pressure: 125/71 mmHg, Capillary Blood Glucose: 187 mg/dl. General Notes: Wound exam; the plantar left heel wound now has a clean granulated base quite a bit different from when she first came in although this wound still has substantial depth it does not probe to bone. The area  on the left lateral foot is a smaller wound still with some depth clean granulation. No evidence of infection bilaterally Integumentary (Hair, Skin) Wound #2 status is Open. Original cause of wound was Gradually Appeared. The date acquired was: 08/04/2021. The wound has been in treatment 8 weeks. The wound is located on the Left Calcaneus. The wound measures 3.6cm length x 4cm width x 1.1cm depth; 11.31cm^2 area and 12.441cm^3 volume. There is Fat Layer (Subcutaneous Tissue) exposed. There is no tunneling or undermining noted. There is a large amount of  serosanguineous drainage noted. The wound margin is distinct with the outline attached to the wound base. There is medium (34-66%) red granulation within the wound bed. There is a medium (34-66%) amount of necrotic tissue within the wound bed including Adherent Slough. Wound #3 status is Open. Original cause of wound was Gradually Appeared. The date acquired was: 08/04/2021. The wound has been in treatment 8 weeks. The wound is located on the Left,Lateral Foot. The wound measures 0.9cm length x 1.2cm width x 0.3cm depth; 0.848cm^2 area and 0.254cm^3 volume. There is Fat Layer (Subcutaneous Tissue) exposed. There is no tunneling or undermining noted. There is a medium amount of serosanguineous drainage noted. The wound margin is distinct with the outline attached to the wound base. There is medium (34-66%) red granulation within the wound bed. There is a medium (34- 66%) amount of necrotic tissue within the wound bed including Adherent Slough. 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 Bathing/ Shower/ Hygiene: May shower with protection but do not get wound dressing(s) wet. Edema Control - Lymphedema / SCD / Other: Elevate legs to the level of the heart or above for 30 minutes daily and/or when sitting, a frequency of: Avoid standing for long periods of time. Off-Loading: Other: - Offloading boot to left foot; float heel/foot when sitting/laying down. Use pillow and place under your leg so your heel isnt laying against bed!!!! Home Health: New wound care orders this  week; continue Home Health for wound care. May utilize formulary equivalent dressing for wound treatment orders unless otherwise specified. - [place] to change 2-3 x a wk WOUND #2: - Calcaneus Wound Laterality: Left Cleanser: Normal Saline ([place]) 1 x Per Day/30 Days Discharge Instructions: Cleanse the wound with Normal Saline prior to applying a clean dressing using gauze sponges, not tissue or cotton balls. Cleanser: Soap and Water [facility]) 1 x Per Day/30 Days Discharge Instructions: May shower and wash wound with dial antibacterial soap and water prior to dressing change. Cleanser: Wound Cleanser (Home Health) 1 x Per Day/30 Days Discharge Instructions: Cleanse the wound with wound cleanser prior to applying a clean dressing using gauze sponges, not tissue or cotton balls. Prim Dressing: 1/4 strength Dakin's wet to dry (Home Health) 1 x Per Day/30 Days ary Discharge Instructions: apply Dakin's wet to dry apply directly to wound bed. Secondary Dressing: Woven Gauze Sponge, Non-Sterile 4x4 in (Home Health) 1 x Per Day/30 Days Discharge Instructions: Apply over primary dressing as directed. Secondary Dressing: ABD Pad, 5x9 (Home Health) 1 x Per Day/30 Days Discharge Instructions: Apply over primary dressing as directed. Secured  With: Kerlix Roll Sterile, 4.5x3.1 (in/yd) (Home Health) 1 x Per Day/30 Days Discharge Instructions: Secure with Kerlix as directed. Secured With: 70M Medipore H Soft Cloth Surgical T ape, 4 x 10 (in/yd) (Home Health) 1 x Per Day/30 Days Discharge Instructions: Secure with tape as directed. WOUND #3: - Foot Wound Laterality: Left, Lateral Cleanser: Normal Saline (Home Health) 1 x Per Day/30 Days Discharge Instructions: Cleanse the wound with Normal Saline prior to applying a clean dressing using gauze sponges, not tissue or cotton balls. Cleanser: Soap and Water [facility]) 1 x Per Day/30 Days Discharge Instructions: May shower and wash wound  with dial antibacterial soap and water prior to dressing change. Cleanser: Wound Cleanser (Home Health) 1 x Per Day/30 Days Discharge Instructions: Cleanse the wound with wound cleanser prior to applying a clean dressing using gauze sponges, not tissue or cotton balls. Prim Dressing: 1/4 strength Dakin's wet to dry (Home Health) 1 x Per Day/30 Days ary Discharge Instructions: apply Dakin's wet to dry apply directly to wound bed. Secondary Dressing: Woven Gauze Sponge, Non-Sterile 4x4 in (Home Health) 1 x Per Day/30 Days Discharge Instructions: Apply over primary dressing as directed. Secondary Dressing: ABD Pad, 5x9 (Home Health) 1 x Per Day/30 Days Discharge Instructions: Apply over primary dressing as directed. Secured With: [employer], 4.5x3.1 (in/yd) (Home Health) 1 x Per Day/30 Days Discharge Instructions: Secure with Kerlix as directed. Secured With: 70M Medipore H Soft Cloth Surgical T ape, 4 x 10 (in/yd) (Home Health) 1 x Per Day/30 Days Discharge Instructions: Secure with tape as directed. 1. After discussion with Dr. Gwenlyn Found he agreed to go ahead with arterial Doppler studies these were not done during her initial arterial studies at which time her ABI in the left leg was 0.5. If there is a target then I think an attempted revascularization might be reasonable even given the possibility of acute on chronic renal failure. 2. Apparently she has arrangements with Dr. Donzetta Matters for a AV shunt I think next week. She has a lot more edema than I remember last visit. 3. Her husband is dressing the wounds with quarter strength Dakin's wet to dry. Once we are more certain about the status of her arterial supply I can either switch to silver collagen or perhaps an advanced treatment product. 4. There is no exposed bone no obvious evidence of infection Electronic Signature(s) Signed: 11/30/2021 4:39:12 PM By: Linton Ham MD Entered By: Linton Ham on 11/30/2021  12:21:56 -------------------------------------------------------------------------------- SuperBill Details Patient Name: Date of Service: Emery. 11/30/2021 Medical Record Number: [id number] Patient Account Number: [id number] Date of Birth/Sex: Treating RN: 06-27-1961 (60 y.o. 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 Facility Procedures CPT4 Code: [id number] Description: 99214 - WOUND CARE VISIT-LEV 4 EST PT Modifier: Quantity: 1 Physician Procedures : CPT4 Code Description Modifier [id number] - WC PHYS LEVEL 4 - EST PT ICD-10 Diagnosis Description 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

## 2021-12-01 ENCOUNTER — Other Ambulatory Visit (HOSPITAL_COMMUNITY): Payer: Self-pay

## 2021-12-01 NOTE — Progress Notes (Signed)
Nancy [name], Nancy [name] ([id number]) Visit Report for 11/30/2021 Arrival Information Details Patient Name: Date of Service: Nancy [name] 11/30/2021 10:45 A M Medical Record Number: [id number] Patient Account Number: [id number] Date of Birth/Sex: Treating RN: [birth year] (61 y.o. Nancy [name], Nancy [name] Primary Care Nancy [name]: Nancy [name] Other Clinician: Referring Nancy [name]: Treating Nancy [name]/Extender: Nancy [name] in Treatment: 8 Visit Information History Since Last Visit Added or deleted any medications: No Patient Arrived: Wheel Chair Any new allergies or adverse reactions: No Arrival Time: 11:29 Had a fall or experienced change in No Accompanied By: husband activities of daily living that may affect Transfer Assistance: None risk of falls: Patient Identification Verified: Yes Signs or symptoms of abuse/neglect since last visito No Secondary Verification Process Completed: Yes Hospitalized since last visit: No Patient Requires Transmission-Based Precautions: No Implantable device outside of the clinic excluding No Patient Has Alerts: Yes cellular tissue based products placed in the center Patient Alerts: ABI's: 09/22 R:0.45 L0.5 since last visit: Has Dressing in Place as Prescribed: Yes Pain Present Now: No Electronic Signature(s) Signed: 12/01/2021 8:55:23 AM By: Nancy [name] Entered By: Nancy [name] on 11/30/2021 11:31:01 -------------------------------------------------------------------------------- Clinic Level of Care Assessment Details Patient Name: Date of Service: Nancy [name] 11/30/2021 10:45 A M Medical Record Number: [id number] Patient Account Number: [id number] Date of Birth/Sex: Treating RN: December 04, 1961 (60 y.o. Nancy [name] Primary Care [name]: Nancy [name] Other Clinician: Referring Nancy [name]: Treating Nancy [name]/Extender: Nancy [name] in Treatment: 8 Clinic Level of  Care Assessment Items TOOL 4 Quantity Score []  - 0 Use when only an EandM is performed on FOLLOW-UP visit ASSESSMENTS - Nursing Assessment / Reassessment X- 1 10 Reassessment of Co-morbidities (includes updates in patient status) X- 1 5 Reassessment of Adherence to Treatment Plan ASSESSMENTS - Wound and Skin A ssessment / Reassessment []  - 0 Simple Wound Assessment / Reassessment - one wound X- 2 5 Complex Wound Assessment / Reassessment - multiple wounds []  - 0 Dermatologic / Skin Assessment (not related to wound area) ASSESSMENTS - Focused Assessment X- 1 5 Circumferential Edema Measurements - multi extremities []  - 0 Nutritional Assessment / Counseling / Intervention X- 1 5 Lower Extremity Assessment (monofilament, tuning fork, pulses) []  - 0 Peripheral Arterial Disease Assessment (using hand held doppler) ASSESSMENTS - Ostomy and/or Continence Assessment and Care []  - 0 Incontinence Assessment and Management []  - 0 Ostomy Care Assessment and Management (repouching, etc.) PROCESS - Coordination of Care X - Simple Patient / Family Education for ongoing care 1 15 []  - 0 Complex (extensive) Patient / Family Education for ongoing care X- 1 10 Staff obtains Programmer, systems, Records, T Results / Process Orders est X- 1 10 Staff telephones HHA, Nursing Homes / Clarify orders / etc []  - 0 Routine Transfer to another Facility (non-emergent condition) []  - 0 Routine Hospital Admission (non-emergent condition) []  - 0 New Admissions / Biomedical engineer / Ordering NPWT Apligraf, etc. , []  - 0 Emergency Hospital Admission (emergent condition) X- 1 10 Simple Discharge Coordination []  - 0 Complex (extensive) Discharge Coordination PROCESS - Special Needs []  - 0 Pediatric / Minor Patient Management []  - 0 Isolation Patient Management []  - 0 Hearing / Language / Visual special needs []  - 0 Assessment of Community assistance (transportation, D/C planning, etc.) []  -  0 Additional assistance / Altered mentation []  - 0 Support Surface(s) Assessment (bed, cushion, seat, etc.) INTERVENTIONS - Wound Cleansing / Measurement []  - 0 Simple Wound Cleansing -  one wound X- 2 5 Complex Wound Cleansing - multiple wounds X- 1 5 Wound Imaging (photographs - any number of wounds) []  - 0 Wound Tracing (instead of photographs) []  - 0 Simple Wound Measurement - one wound X- 2 5 Complex Wound Measurement - multiple wounds INTERVENTIONS - Wound Dressings X - Small Wound Dressing one or multiple wounds 2 10 []  - 0 Medium Wound Dressing one or multiple wounds []  - 0 Large Wound Dressing one or multiple wounds []  - 0 Application of Medications - topical []  - 0 Application of Medications - injection INTERVENTIONS - Miscellaneous []  - 0 External ear exam []  - 0 Specimen Collection (cultures, biopsies, blood, body fluids, etc.) []  - 0 Specimen(s) / Culture(s) sent or taken to Lab for analysis []  - 0 Patient Transfer (multiple staff / Civil Service fast streamer / Similar devices) []  - 0 Simple Staple / Suture removal (25 or less) []  - 0 Complex Staple / Suture removal (26 or more) []  - 0 Hypo / Hyperglycemic Management (close monitor of Blood Glucose) []  - 0 Ankle / Brachial Index (ABI) - do not check if billed separately X- 1 5 Vital Signs Has the patient been seen at the hospital within the last three years: Yes Total Score: 130 Level Of Care: New/Established - Level 4 Electronic Signature(s) Signed: 11/30/2021 5:32:07 PM By: Nancy Gouty RN, BSN Entered By: Nancy [name] on 11/30/2021 12:01:05 -------------------------------------------------------------------------------- Encounter Discharge Information Details Patient Name: Date of Service: Nancy [name] 11/30/2021 10:45 A M Medical Record Number: [id number] Patient Account Number: [id number] Date of Birth/Sex: Treating RN: 1961/03/09 (60 y.o. Nancy [name] Primary Care Francella [name]: Nancy [name] Other Clinician: Referring Nancy [name]: Treating Nancy [name]/Extender: Nancy [name] in Treatment: 8 Encounter Discharge Information Items Discharge Condition: Stable Ambulatory Status: Wheelchair Discharge Destination: Home Transportation: Private Auto Accompanied By: spouse Schedule Follow-up Appointment: Yes Clinical Summary of Care: Patient Declined Electronic Signature(s) Signed: 11/30/2021 5:32:07 PM By: Nancy Gouty RN, BSN Entered By: Nancy [name] on 11/30/2021 12:05:06 -------------------------------------------------------------------------------- Lower Extremity Assessment Details Patient Name: Date of Service: Nancy [name] 11/30/2021 10:45 A M Medical Record Number: [id number] Patient Account Number: [id number] Date of Birth/Sex: Treating RN: [birth year] (60 y.o. Nancy [name] Primary Care Anays Detore: Nancy [name] Other Clinician: Referring Brenden Rudman: Treating Ona Rathert/Extender: Nancy [name] in Treatment: 8 Edema Assessment Assessed: Shirlyn Goltz: No] [Right: No] Edema: [Left: Ye] [Right: s] Calf Left: Right: Point of Measurement: 33 cm From Medial Instep 48 cm Ankle Left: Right: Point of Measurement: 10 cm From Medial Instep 30 cm Vascular Assessment Pulses: Dorsalis Pedis Palpable: [Left:No] Electronic Signature(s) Signed: 11/30/2021 5:32:07 PM By: Nancy Gouty RN, BSN Entered By: Nancy [name] on 11/30/2021 11:40:12 -------------------------------------------------------------------------------- Multi Wound Chart Details Patient Name: Date of Service: Nancy [name] 11/30/2021 10:45 A M Medical Record Number: [id number] Patient Account Number: [id number] Date of Birth/Sex: Treating RN: [birth year] (60 y.o. [name] Primary Care Clarkson Rosselli: Nancy [name] Other Clinician: Referring Ciella Obi: Treating Sharlyne Koeneman/Extender: Nancy [name]  in Treatment: 8 Vital Signs Height(in): 63 Capillary Blood Glucose(mg/dl): 187 Weight(lbs): 232 Pulse(bpm): 43 Body Mass Index(BMI): 26 Blood Pressure(mmHg): 125/71 Temperature(F): 98.8 Respiratory Rate(breaths/min): 17 Photos: [2:No Photos Left Calcaneus] [3:No Photos Left, Lateral Foot] [N/A:N/A N/A] Wound Location: [2:Gradually Appeared] [3:Gradually Appeared] [N/A:N/A] Wounding Event: [2:Diabetic Wound/Ulcer of the Lower] [3:Diabetic Wound/Ulcer of the Lower] [N/A:N/A] Primary Etiology: [2:Extremity Congestive Heart Failure,] [3:Extremity Congestive Heart Failure,] [N/A:N/A] Comorbid History: [2:Hypertension,  Peripheral Arterial Disease, Type II Diabetes, Dementia, Disease, Type II Diabetes, Dementia, Neuropathy 08/04/2021] [3:Hypertension, Peripheral Arterial Neuropathy 08/04/2021] [N/A:N/A] Date Acquired: [2:8] [3:8] [N/A:N/A] Weeks of Treatment: [2:Open] [3:Open] [N/A:N/A] Wound Status: [2:3.6x4x1.1] [3:0.9x1.2x0.3] [N/A:N/A] Measurements L x W x D (cm) [2:11.31] [3:0.848] [N/A:N/A] A (cm) : rea [2:12.441] [3:0.254] [N/A:N/A] Volume (cm) : [2:50.30%] [3:83.10%] [N/A:N/A] % Reduction in A rea: [2:-446.10%] [3:49.50%] [N/A:N/A] % Reduction in Volume: [2:Grade 1] [3:Grade 1] [N/A:N/A] Classification: [2:Large] [3:Medium] [N/A:N/A] Exudate A mount: [2:Serosanguineous] [3:Serosanguineous] [N/A:N/A] Exudate Type: [2:red, brown] [3:red, brown] [N/A:N/A] Exudate Color: [2:Distinct, outline attached] [3:Distinct, outline attached] [N/A:N/A] Wound Margin: [2:Medium (34-66%)] [3:Medium (34-66%)] [N/A:N/A] Granulation A mount: [2:Red] [3:Red] [N/A:N/A] Granulation Quality: [2:Medium (34-66%)] [3:Medium (34-66%)] [N/A:N/A] Necrotic A mount: [2:Fat Layer (Subcutaneous Tissue): Yes Fat Layer (Subcutaneous Tissue): Yes N/A] Exposed Structures: [2:Fascia: No Tendon: No Muscle: No Joint: No Bone: No Small (1-33%)] [3:Fascia: No Tendon: No Muscle: No Joint: No Bone: No None]  [N/A:N/A] Treatment Notes Wound #2 (Calcaneus) Wound Laterality: Left Cleanser Normal Saline Discharge Instruction: Cleanse the wound with Normal Saline prior to applying a clean dressing using gauze sponges, not tissue or cotton balls. Soap and Water Discharge Instruction: May shower and wash wound with dial antibacterial soap and water prior to dressing change. Wound Cleanser Discharge Instruction: Cleanse the wound with wound cleanser prior to applying a clean dressing using gauze sponges, not tissue or cotton balls. Peri-Wound Care Topical Primary Dressing 1/4 strength Dakin's wet to dry Discharge Instruction: apply Dakin's wet to dry apply directly to wound bed. Secondary Dressing Woven Gauze Sponge, Non-Sterile 4x4 in Discharge Instruction: Apply over primary dressing as directed. ABD Pad, 5x9 Discharge Instruction: Apply over primary dressing as directed. Secured With [employer], 4.5x3.1 (in/yd) Discharge Instruction: Secure with Kerlix as directed. 966M Medipore H Soft Cloth Surgical T ape, 4 x 10 (in/yd) Discharge Instruction: Secure with tape as directed. Compression Wrap Compression Stockings Add-Ons Wound #3 (Foot) Wound Laterality: Left, Lateral Cleanser Normal Saline Discharge Instruction: Cleanse the wound with Normal Saline prior to applying a clean dressing using gauze sponges, not tissue or cotton balls. Soap and Water Discharge Instruction: May shower and wash wound with dial antibacterial soap and water prior to dressing change. Wound Cleanser Discharge Instruction: Cleanse the wound with wound cleanser prior to applying a clean dressing using gauze sponges, not tissue or cotton balls. Peri-Wound Care Topical Primary Dressing 1/4 strength Dakin's wet to dry Discharge Instruction: apply Dakin's wet to dry apply directly to wound bed. Secondary Dressing Woven Gauze Sponge, Non-Sterile 4x4 in Discharge Instruction: Apply over primary dressing as  directed. ABD Pad, 5x9 Discharge Instruction: Apply over primary dressing as directed. Secured With [employer], 4.5x3.1 (in/yd) Discharge Instruction: Secure with Kerlix as directed. 966M Medipore H Soft Cloth Surgical T ape, 4 x 10 (in/yd) Discharge Instruction: Secure with tape as directed. Compression Wrap Compression Stockings Add-Ons Electronic Signature(s) Signed: 11/30/2021 4:39:12 PM By: Linton Ham MD Signed: 11/30/2021 5:30:53 PM By: Deon Pilling RN, BSN Entered By: Linton Ham on 11/30/2021 12:13:38 -------------------------------------------------------------------------------- Multi-Disciplinary Care Plan Details Patient Name: Date of Service: [address] NICA H. 11/30/2021 10:45 A M Medical Record Number: [id number] Patient Account Number: [id number] Date of Birth/Sex: Treating RN: 1961/07/30 (60 y.o. Nancy [name] Primary Care Obed Samek: Nancy [name] Other Clinician: Referring Sumiko Ceasar: Treating Jasie Meleski/Extender: Nancy [name] in Treatment: 8 Active Inactive Wound/Skin Impairment Nursing Diagnoses: Impaired tissue integrity Knowledge deficit related to ulceration/compromised skin integrity Goals: Patient will have a decrease  in wound volume by X% from date: (specify in notes) Date Initiated: 10/04/2021 Target Resolution Date: 12/15/2021 Goal Status: Active Patient/caregiver will verbalize understanding of skin care regimen Date Initiated: 10/04/2021 Target Resolution Date: 12/08/2021 Goal Status: Active Ulcer/skin breakdown will have a volume reduction of 30% by week 4 Date Initiated: 10/04/2021 Target Resolution Date: 12/14/2021 Goal Status: Active Interventions: Assess patient/caregiver ability to obtain necessary supplies Assess patient/caregiver ability to perform ulcer/skin care regimen upon admission and as needed Assess ulceration(s) every visit Notes: Electronic Signature(s) Signed:  11/30/2021 5:32:07 PM By: Nancy Gouty RN, BSN Entered By: Nancy [name] on 11/30/2021 11:46:45 -------------------------------------------------------------------------------- Pain Assessment Details Patient Name: Date of Service: Nancy [name] 11/30/2021 10:45 A M Medical Record Number: [id number] Patient Account Number: [id number] Date of Birth/Sex: Treating RN: November 25, 1961 (60 y.o. [name] Primary Care Clair Alfieri: Nancy [name] Other Clinician: Referring Crytal Pensinger: Treating Meka Lewan/Extender: Nancy [name] in Treatment: 8 Active Problems Location of Pain Severity and Description of Pain Patient Has Paino No Site Locations Pain Management and Medication Current Pain Management: Electronic Signature(s) Signed: 11/30/2021 5:30:53 PM By: Deon Pilling RN, BSN Signed: 12/01/2021 8:55:23 AM By: Nancy [name] Entered By: Nancy [name] on 11/30/2021 11:31:55 -------------------------------------------------------------------------------- Patient/Caregiver Education Details Patient Name: Date of Service: Nancy [name] 12/15/2022andnbsp10:45 A M Medical Record Number: [id number] Patient Account Number: [id number] Date of Birth/Gender: Treating RN: 1961/04/14 (60 y.o. Nancy [name] Primary Care Physician: Nancy [name] Other Clinician: Referring Physician: Treating Physician/Extender: Nancy [name] in Treatment: 8 Education Assessment Education Provided To: Patient Education Topics Provided Tissue Oxygenation: Methods: Explain/Verbal Responses: Reinforcements needed, State content correctly Wound/Skin Impairment: Methods: Explain/Verbal Responses: Reinforcements needed, State content correctly Electronic Signature(s) Signed: 11/30/2021 5:32:07 PM By: Nancy Gouty RN, BSN Entered By: Nancy [name] on 11/30/2021  11:47:17 -------------------------------------------------------------------------------- Wound Assessment Details Patient Name: Date of Service: Nancy [name] 11/30/2021 10:45 A M Medical Record Number: [id number] Patient Account Number: [id number] Date of Birth/Sex: Treating RN: 1961/06/03 (60 y.o. Nancy [name], Meta.Reding Primary Care Franciszek Platten: Nancy [name] Other Clinician: Referring Seville Downs: Treating Jearldean Gutt/Extender: Nancy [name] in Treatment: 8 Wound Status Wound Number: 2 Primary Diabetic Wound/Ulcer of the Lower Extremity Etiology: Wound Location: Left Calcaneus Wound Open Wounding Event: Gradually Appeared Status: Date Acquired: 08/04/2021 Comorbid Congestive Heart Failure, Hypertension, Peripheral Arterial Weeks Of Treatment: 8 History: Disease, Type II Diabetes, Dementia, Neuropathy Clustered Wound: No Wound Measurements Length: (cm) 3.6 Width: (cm) 4 Depth: (cm) 1.1 Area: (cm) 11.31 Volume: (cm) 12.441 % Reduction in Area: 50.3% % Reduction in Volume: -446.1% Epithelialization: Small (1-33%) Tunneling: No Undermining: No Wound Description Classification: Grade 1 Wound Margin: Distinct, outline attached Exudate Amount: Large Exudate Type: Serosanguineous Exudate Color: red, brown Foul Odor After Cleansing: No Slough/Fibrino Yes Wound Bed Granulation Amount: Medium (34-66%) Exposed Structure Granulation Quality: Red Fascia Exposed: No Necrotic Amount: Medium (34-66%) Fat Layer (Subcutaneous Tissue) Exposed: Yes Necrotic Quality: Adherent Slough Tendon Exposed: No Muscle Exposed: No Joint Exposed: No Bone Exposed: No Treatment Notes Wound #2 (Calcaneus) Wound Laterality: Left Cleanser Normal Saline Discharge Instruction: Cleanse the wound with Normal Saline prior to applying a clean dressing using gauze sponges, not tissue or cotton balls. Soap and Water Discharge Instruction: May shower and wash wound with  dial antibacterial soap and water prior to dressing change. Wound Cleanser Discharge Instruction: Cleanse the wound with wound cleanser prior to applying a clean dressing using gauze sponges, not tissue or cotton  balls. Peri-Wound Care Topical Primary Dressing 1/4 strength Dakin's wet to dry Discharge Instruction: apply Dakin's wet to dry apply directly to wound bed. Secondary Dressing Woven Gauze Sponge, Non-Sterile 4x4 in Discharge Instruction: Apply over primary dressing as directed. ABD Pad, 5x9 Discharge Instruction: Apply over primary dressing as directed. Secured With [employer], 4.5x3.1 (in/yd) Discharge Instruction: Secure with Kerlix as directed. 52M Medipore H Soft Cloth Surgical T ape, 4 x 10 (in/yd) Discharge Instruction: Secure with tape as directed. Compression Wrap Compression Stockings Add-Ons Electronic Signature(s) Signed: 11/30/2021 5:30:53 PM By: Deon Pilling RN, BSN Signed: 11/30/2021 5:32:07 PM By: Nancy Gouty RN, BSN Entered By: Nancy [name] on 11/30/2021 11:40:59 -------------------------------------------------------------------------------- Wound Assessment Details Patient Name: Date of Service: Nancy [name] 11/30/2021 10:45 A M Medical Record Number: [id number] Patient Account Number: [id number] Date of Birth/Sex: Treating RN: 06-Apr-1961 (60 y.o. Nancy [name], Meta.Reding Primary Care Jotham Ahn: Nancy [name] Other Clinician: Referring Kelleigh Skerritt: Treating Daschel Roughton/Extender: Nancy [name] in Treatment: 8 Wound Status Wound Number: 3 Primary Diabetic Wound/Ulcer of the Lower Extremity Etiology: Wound Location: Left, Lateral Foot Wound Open Wounding Event: Gradually Appeared Status: Date Acquired: 08/04/2021 Comorbid Congestive Heart Failure, Hypertension, Peripheral Arterial Weeks Of Treatment: 8 History: Disease, Type II Diabetes, Dementia, Neuropathy Clustered Wound: No Wound  Measurements Length: (cm) 0.9 Width: (cm) 1.2 Depth: (cm) 0.3 Area: (cm) 0.848 Volume: (cm) 0.254 % Reduction in Area: 83.1% % Reduction in Volume: 49.5% Epithelialization: None Tunneling: No Undermining: No Wound Description Classification: Grade 1 Wound Margin: Distinct, outline attached Exudate Amount: Medium Exudate Type: Serosanguineous Exudate Color: red, brown Foul Odor After Cleansing: No Slough/Fibrino Yes Wound Bed Granulation Amount: Medium (34-66%) Exposed Structure Granulation Quality: Red Fascia Exposed: No Necrotic Amount: Medium (34-66%) Fat Layer (Subcutaneous Tissue) Exposed: Yes Necrotic Quality: Adherent Slough Tendon Exposed: No Muscle Exposed: No Joint Exposed: No Bone Exposed: No Treatment Notes Wound #3 (Foot) Wound Laterality: Left, Lateral Cleanser Normal Saline Discharge Instruction: Cleanse the wound with Normal Saline prior to applying a clean dressing using gauze sponges, not tissue or cotton balls. Soap and Water Discharge Instruction: May shower and wash wound with dial antibacterial soap and water prior to dressing change. Wound Cleanser Discharge Instruction: Cleanse the wound with wound cleanser prior to applying a clean dressing using gauze sponges, not tissue or cotton balls. Peri-Wound Care Topical Primary Dressing 1/4 strength Dakin's wet to dry Discharge Instruction: apply Dakin's wet to dry apply directly to wound bed. Secondary Dressing Woven Gauze Sponge, Non-Sterile 4x4 in Discharge Instruction: Apply over primary dressing as directed. ABD Pad, 5x9 Discharge Instruction: Apply over primary dressing as directed. Secured With [employer], 4.5x3.1 (in/yd) Discharge Instruction: Secure with Kerlix as directed. 52M Medipore H Soft Cloth Surgical T ape, 4 x 10 (in/yd) Discharge Instruction: Secure with tape as directed. Compression Wrap Compression Stockings Add-Ons Electronic Signature(s) Signed: 11/30/2021  5:30:53 PM By: Deon Pilling RN, BSN Signed: 11/30/2021 5:32:07 PM By: Nancy Gouty RN, BSN Entered By: Nancy [name] on 11/30/2021 11:41:37 -------------------------------------------------------------------------------- Vitals Details Patient Name: Date of Service: Nancy [name] 11/30/2021 10:45 A M Medical Record Number: [id number] Patient Account Number: [id number] Date of Birth/Sex: Treating RN: 1961/01/29 (60 y.o. [name] Primary Care Malosi Hemstreet: Nancy [name] Other Clinician: Referring Shaquayla Klimas: Treating Aubreana Cornacchia/Extender: Nancy [name] in Treatment: 8 Vital Signs Time Taken: 11:31 Temperature (F): 98.8 Height (in): 63 Pulse (bpm): 74 Weight (lbs): 232 Respiratory Rate (breaths/min): 17 Body Mass Index (BMI):  41.1 Blood Pressure (mmHg): 125/71 Capillary Blood Glucose (mg/dl): 187 Reference Range: 80 - 120 mg / dl Electronic Signature(s) Signed: 12/01/2021 8:55:23 AM By: Nancy [name] Entered By: Nancy [name] on 11/30/2021 11:31:33

## 2021-12-04 ENCOUNTER — Inpatient Hospital Stay: Payer: Medicaid Other | Admitting: Internal Medicine

## 2021-12-04 ENCOUNTER — Inpatient Hospital Stay (HOSPITAL_COMMUNITY): Admission: RE | Admit: 2021-12-04 | Payer: Medicaid Other | Source: Ambulatory Visit

## 2021-12-04 ENCOUNTER — Encounter (HOSPITAL_COMMUNITY): Payer: Self-pay

## 2021-12-06 ENCOUNTER — Other Ambulatory Visit (HOSPITAL_COMMUNITY): Payer: Self-pay

## 2021-12-06 NOTE — Progress Notes (Signed)
Paramedicine Encounter    Patient ID: Nancy [name], female    DOB: [birth year], 60 y.o.   MRN: [id number]    Arrived for home visit for [name] where she was seated in her chair within their apartment. Nancy [name] reports feeling okay just slightly short of breath with some leg swelling. On assessment swelling appears the same as last visit, weight is down this week from 242lbs to 234lbs.  Lungs had some rhonchi on assessment. She reports she has had some coughing and post nasal drip over the last few days. She denied fever or body aches.   Vitals and assessment obtained.   Meds reviewed, she missed every noon and bedtime dose including Calcium and Risperidone. As well as last nights evening dose including her Torsemide. I reminded her the importance of taking these medications.   Nancy [name]'s husband [name] expressed that appointments are getting scheduled without consultation with him or the patient and he is frustrated with this. I advised him I understood and would assist in calling to cancel the ones they wanted to reschedule and have those clinics reach out to him directly. He appreciated this.   Meds reviewed, pill box filled for one week. Dexcom in place and CBG- 178 Readings all below 300 over the last week.   We reviewed appointments and home visit complete.   Refills: Ferrous Sulfate  Patient Care Team: [name], MD as PCP - General (Internal Medicine) [name], MD as PCP - Cardiology (Cardiology) Larey Dresser, MD as PCP - Advanced Heart Failure (Cardiology)  Patient Active Problem List   Diagnosis Date Noted   Critical limb ischemia of both lower extremities ([place]) 11/22/2021   Osteomyelitis ([place])    Peripheral artery disease ([place]) 10/27/2021   Acute exacerbation of congestive heart failure ([place]) 10/24/2021   Acute cardiogenic pulmonary edema ([place]) 10/06/2021   Coronary artery disease involving native coronary artery of native heart without  angina pectoris 10/06/2021   Goals of care, counseling/discussion    Gangrene of left foot ([place])    Cellulitis of left foot    Diabetic ulcer of left foot ([place]) 09/04/2021   CHF (congestive heart failure) ([place]) 07/07/2021   Normocytic anemia 06/30/2021   CKD (chronic kidney disease), stage IV ([place]) 06/11/2021   Acute on chronic diastolic CHF (congestive heart failure) ([place]) 06/10/2021   Abnormal nuclear stress test    Dyspnea on exertion 03/07/2021   Orthopnea 02/25/2021   History of cerebrovascular accident (CVA) with residual deficit 05/06/2020   Diabetic peripheral neuropathy ([place]) 04/26/2020   AKI (acute kidney injury) ([place]) 04/20/2020   Generalized weakness 04/20/2020   Dehydration 04/20/2020   Generalized abdominal pain    Pancreatitis, acute 02/29/2020   Cerebrovascular accident (CVA) ([place]) 11/08/2019   Stenosis of right carotid artery 11/08/2019   Mixed diabetic hyperlipidemia associated with type 2 diabetes mellitus ([place]) 11/08/2019   Cortical age-related cataract of both eyes 08/30/2019   Gait abnormality 08/30/2019   Statin declined 08/30/2019   Gastroesophageal reflux disease without esophagitis 04/18/2018   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   Type 2 diabetes mellitus with stage 4 chronic kidney disease, with long-term current use of insulin ([place]) 10/29/2017   History of macular degeneration 10/29/2017   Chronic cervical pain 10/29/2016   Myalgia 09/14/2016   Insomnia 09/14/2016   Tinea pedis of both feet 08/20/2016   Macular degeneration 08/17/2016   Low back pain 09/21/2014   Hypertensive urgency 09/21/2014   Diabetic gastroparesis associated with type 2 diabetes mellitus ([place])  09/14/2014   Uncontrolled hypertension 09/08/2014   Thyroid nodule 08/17/2014   Obesity (BMI 30-39.9) 08/17/2014   Nausea & vomiting 08/04/2014   Type II diabetes mellitus with neurological manifestations, uncontrolled 08/04/2014    Current Outpatient Medications:    acetaminophen (TYLENOL)  500 MG tablet, Take 500 mg by mouth every 6 (six) hours as needed for moderate pain or headache., Disp: , Rfl:    amLODipine (NORVASC) 10 MG tablet, Take 1 tablet (10 mg total) by mouth daily., Disp: 30 tablet, Rfl: 6   aspirin 81 MG EC tablet, Take 1 tablet (81 mg total) by mouth daily., Disp: 60 tablet, Rfl: 1   atorvastatin (LIPITOR) 40 MG tablet, Take 1 tablet (40 mg total) by mouth daily., Disp: 30 tablet, Rfl: 6   Blood Glucose Monitoring Suppl (ONETOUCH VERIO) w/Device KIT, Check blood sugar three times daily. E11.40, Disp: 1 kit, Rfl: 0   calcium acetate (PHOSLO) 667 MG capsule, Take 1 capsule (667 mg total) by mouth 3 (three) times daily with meals., Disp: 90 capsule, Rfl: 3   carvedilol (COREG) 12.5 MG tablet, Take 1.5 tablets (18.75 mg total) by mouth 2 (two) times daily with a meal., Disp: 90 tablet, Rfl: 6   Continuous Blood Gluc Receiver (DEXCOM G6 RECEIVER) DEVI, 1 Device by Does not apply route daily., Disp: 1 each, Rfl: 0   Continuous Blood Gluc Sensor (DEXCOM G6 SENSOR) MISC, 1 packet by Does not apply route daily., Disp: 3 each, Rfl: 11   Continuous Blood Gluc Transmit (DEXCOM G6 TRANSMITTER) MISC, 1 packet by Does not apply route daily., Disp: 1 each, Rfl: 4   ferrous sulfate 325 (65 FE) MG tablet, Take 1 tablet (325 mg total) by mouth daily with breakfast., Disp: 100 tablet, Rfl: 1   glucose blood test strip, Use as instructedCheck blood sugar three times daily. E11.40, Disp: 100 each, Rfl: 12   insulin glargine (LANTUS) 100 UNIT/ML Solostar Pen, Inject 30 Units into the skin at bedtime., Disp: 15 mL, Rfl: 3   insulin lispro (HUMALOG KWIKPEN) 100 UNIT/ML KwikPen, Inject 4 units before breakfast and dinner., Disp: 15 mL, Rfl: 3   Insulin Pen Needle 32G X 4 MM MISC, Use to inject insulin as directed., Disp: 100 each, Rfl: 4   metolazone (ZAROXOLYN) 2.5 MG tablet, Take 1 tablet (2.5 mg total) by mouth once for 1 dose., Disp: 1 tablet, Rfl: 0   risperiDONE (RISPERDAL) 3 MG tablet,  TAKE 1 TABLET (3 MG TOTAL) BY MOUTH AT BEDTIME., Disp: 30 tablet, Rfl: 2   torsemide (DEMADEX) 20 MG tablet, Take 3 tablets (60 mg total) by mouth every morning AND 2 tablets (40 mg total) every evening., Disp: 150 tablet, Rfl: 6 Allergies  Allergen Reactions   Elemental Sulfur Hives and Other (See Comments)    PATIENT STATED THIS, MORE THAN LIKELY, SHOULD HAVE BEEN LOGGED AS "SULFA"   Hydralazine Hcl Other (See Comments)    Hair loss   Hydrocodone Itching and Other (See Comments)    Upset stomach   Metformin And Related Nausea And Vomiting and Other (See Comments)    Stomach pains, also   Other Nausea Only and Other (See Comments)    Lettuce- Does not digest this!!   Plaquenil [Hydroxychloroquine Sulfate] Hives   Shellfish-Derived Products Nausea Only and Other (See Comments)    Caused an upset stomach   Shrimp (Diagnostic) Nausea Only and Other (See Comments)    Upset stomach    Sulfa Antibiotics Hives     Social  History   Socioeconomic History   Marital status: Legally Separated    Spouse name: [name]   Number of children: 1   Years of education: some colle   Highest education level: Not on file  Occupational History   Occupation: Employed FT as Landscape architect    Comment: [employer] , NA  Tobacco Use   Smoking status: Former    Packs/day: 0.25    Years: 0.50    Pack years: 0.13    Types: Cigarettes   Smokeless tobacco: Never   Tobacco comments:    Former smoker 11/03/21  Vaping Use   Vaping Use: Never used  Substance and Sexual Activity   Alcohol use: No   Drug use: No   Sexual activity: Yes    Birth control/protection: None  Other Topics Concern   Not on file  Social History Narrative   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  Constitutional:      Appearance: Normal appearance. She is obese.  HENT:     Head: Normocephalic.     Nose: Nose normal.     Mouth/Throat:     Mouth: Mucous membranes are moist.     Pharynx: Oropharynx is clear.  Eyes:     Conjunctiva/sclera: Conjunctivae normal.     Pupils: Pupils are equal, round, and reactive to light.  Cardiovascular:     Rate and Rhythm: Normal rate and regular rhythm.     Pulses: Normal pulses.     Heart sounds: Normal heart sounds.  Pulmonary:     Effort: Pulmonary effort is normal.     Breath sounds: Rhonchi present.  Abdominal:     Palpations: Abdomen is soft.  Musculoskeletal:        General: Swelling present. Normal range of motion.     Cervical back: Normal range of motion.     Right lower leg: Edema present.     Left lower leg: Edema present.  Skin:    General: Skin is warm and dry.     Capillary Refill: Capillary refill takes less than 2 seconds.  Neurological:     General: No focal deficit present.     Mental Status: She is alert. Mental status is at baseline.  Psychiatric:        Mood and Affect: Mood normal.        Future Appointments  Date Time Provider [place]  12/08/2021  1:00 PM MC-CV NL VASC 4 [facility]SE[facility]  12/13/2021 10:45 AM Laurice Record, MD [facility]  12/14/2021 10:45 AM Ricard Dillon, MD [facility]  01/05/2022 12:00 PM Larey Dresser, MD MC-HVSC None  05/18/2022 11:15 AM [name], MD [facility]-NORTH[facility]     ACTION: Home visit completed

## 2021-12-07 ENCOUNTER — Telehealth: Payer: Self-pay | Admitting: Licensed Clinical Social Worker

## 2021-12-07 NOTE — Telephone Encounter (Signed)
LCSW received a message from community paramedic, Nira Conn.  She shares that pt will need to reschedule her appt for tomorrow w/ vascular lab. LCSW contacted [place] and provided her with pt spouse's contact to reschedule appt.  Westley Hummer, MSW, [place]  [phone]- work cell phone (preferred) [phone]- desk phone

## 2021-12-08 ENCOUNTER — Ambulatory Visit (HOSPITAL_COMMUNITY)
Admission: RE | Admit: 2021-12-08 | Payer: Medicaid Other | Source: Ambulatory Visit | Attending: Cardiovascular Disease | Admitting: Cardiovascular Disease

## 2021-12-13 ENCOUNTER — Other Ambulatory Visit (HOSPITAL_COMMUNITY): Payer: Self-pay | Admitting: Family Medicine

## 2021-12-13 ENCOUNTER — Telehealth (HOSPITAL_COMMUNITY): Payer: Self-pay

## 2021-12-13 ENCOUNTER — Other Ambulatory Visit (HOSPITAL_COMMUNITY): Payer: Self-pay

## 2021-12-13 ENCOUNTER — Telehealth (HOSPITAL_COMMUNITY): Payer: Self-pay | Admitting: Cardiology

## 2021-12-13 ENCOUNTER — Inpatient Hospital Stay: Payer: Medicaid Other | Admitting: Internal Medicine

## 2021-12-13 MED ORDER — TORSEMIDE 20 MG PO TABS: 80.0000 mg | ORAL_TABLET | Freq: Two times a day (BID) | ORAL | 6 refills | Status: DC

## 2021-12-13 MED ORDER — METOLAZONE 2.5 MG PO TABS: 2.5000 mg | ORAL_TABLET | Freq: Once | ORAL | 0 refills | Status: DC

## 2021-12-13 MED ORDER — POTASSIUM CHLORIDE CRYS ER 20 MEQ PO TBCR: 20.0000 meq | EXTENDED_RELEASE_TABLET | Freq: Once | ORAL | 0 refills | Status: DC

## 2021-12-13 NOTE — Progress Notes (Signed)
Paramedicine Encounter    Patient ID: Nancy [name], female    DOB: Jul 29, 1961, 60 y.o.   MRN: [id number]   Arrived for home visit where Nancy [name] was seated in her chair alert and oriented reporting to be feeling bad with shortness of breath and swelling. I listened to lung sounds and rales and rhonci present in all lobes. She was a-febrile but had bilateral leg swelling noted, worsened than last week. Weight up 5 lbs today on assessment. Vitals as noted.   I called HF clinic triage and NP [facility] ordered IV lasix today (51m) and one dose of 2.54mMetolazone and one dose of 20MEQ Potassium. Torsemide to be increased tomorrow to 8097mID.   I obtained IV and successfully gave 70m13msix at 11:00. She urinated at 11:13.    HF clinic will call to schedule in person visit and labs with the clinic this week. I will follow up tomorrow to see how she is feeling and if any changes are noted.    17:15- Called her and she reports she feels about the same with no improvements. Not peeing much since IV lasix dose at 11:00. She says she peed 4 times since then. She has not received the Metolazone or Potassium yet. I will follow up with pharmacy to check on med delivery.   Refills: Risperidone     Patient Care Team: JohnLadell Pier as PCP - General (Internal Medicine) BerrLorretta Harp as PCP - Cardiology (Cardiology) McLeLarey Dresser as PCP - Advanced Heart Failure (Cardiology)  Patient Active Problem List   Diagnosis Date Noted   Critical limb ischemia of both lower extremities (HCC)[place]/06/2021   Osteomyelitis (HCC)[place] Peripheral artery disease (HCC)[place]/10/2021   Acute exacerbation of congestive heart failure (HCC)[place]/07/2021   Acute cardiogenic pulmonary edema (HCC)[place]/21/2022   Coronary artery disease involving native coronary artery of native heart without angina pectoris 10/06/2021   Goals of care, counseling/discussion    Gangrene of left foot (HCC)[place]  Cellulitis of left foot    Diabetic ulcer of left foot (HCC)[place]/19/2022   CHF (congestive heart failure) (HCC)[place]/22/2022   Normocytic anemia 06/30/2021   CKD (chronic kidney disease), stage IV (HCC)[place]/26/2022   Acute on chronic diastolic CHF (congestive heart failure) (HCC)[place]/25/2022   Abnormal nuclear stress test    Dyspnea on exertion 03/07/2021   Orthopnea 02/25/2021   History of cerebrovascular accident (CVA) with residual deficit 05/06/2020   Diabetic peripheral neuropathy (HCC)[place]/10/2020   AKI (acute kidney injury) (HCC)[place]/04/2020   Generalized weakness 04/20/2020   Dehydration 04/20/2020   Generalized abdominal pain    Pancreatitis, acute 02/29/2020   Cerebrovascular accident (CVA) (HCC)[place]/22/2020   Stenosis of right carotid artery 11/08/2019   Mixed diabetic hyperlipidemia associated with type 2 diabetes mellitus (HCC)[place]/22/2020   Cortical age-related cataract of both eyes 08/30/2019   Gait abnormality 08/30/2019   Statin declined 08/30/2019   Gastroesophageal reflux disease without esophagitis 04/18/2018   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   Type 2 diabetes mellitus with stage 4 chronic kidney disease, with long-term current use of insulin (HCC)[place]/13/2018   History of macular degeneration 10/29/2017   Chronic cervical pain 10/29/2016   Myalgia 09/14/2016   Insomnia 09/14/2016   Tinea pedis of both feet 08/20/2016   Macular degeneration 08/17/2016   Low back pain 09/21/2014   Hypertensive urgency 09/21/2014   Diabetic gastroparesis associated with type 2 diabetes mellitus (  [place]) 09/14/2014   Uncontrolled hypertension 09/08/2014   Thyroid nodule 08/17/2014   Obesity (BMI 30-39.9) 08/17/2014   Nausea & vomiting 08/04/2014   Type II diabetes mellitus with neurological manifestations, uncontrolled 08/04/2014    Current Outpatient Medications:    acetaminophen (TYLENOL) 500 MG tablet, Take 500 mg by mouth every 6 (six) hours as needed for moderate pain or headache., Disp: ,  Rfl:    amLODipine (NORVASC) 10 MG tablet, Take 1 tablet (10 mg total) by mouth daily., Disp: 30 tablet, Rfl: 6   aspirin 81 MG EC tablet, Take 1 tablet (81 mg total) by mouth daily., Disp: 60 tablet, Rfl: 1   atorvastatin (LIPITOR) 40 MG tablet, Take 1 tablet (40 mg total) by mouth daily., Disp: 30 tablet, Rfl: 6   Blood Glucose Monitoring Suppl (ONETOUCH VERIO) w/Device KIT, Check blood sugar three times daily. E11.40, Disp: 1 kit, Rfl: 0   calcium acetate (PHOSLO) 667 MG capsule, Take 1 capsule (667 mg total) by mouth 3 (three) times daily with meals., Disp: 90 capsule, Rfl: 3   carvedilol (COREG) 12.5 MG tablet, Take 1.5 tablets (18.75 mg total) by mouth 2 (two) times daily with a meal., Disp: 90 tablet, Rfl: 6   Continuous Blood Gluc Receiver (DEXCOM G6 RECEIVER) DEVI, 1 Device by Does not apply route daily., Disp: 1 each, Rfl: 0   Continuous Blood Gluc Sensor (DEXCOM G6 SENSOR) MISC, 1 packet by Does not apply route daily., Disp: 3 each, Rfl: 11   Continuous Blood Gluc Transmit (DEXCOM G6 TRANSMITTER) MISC, 1 packet by Does not apply route daily., Disp: 1 each, Rfl: 4   ferrous sulfate 325 (65 FE) MG tablet, Take 1 tablet (325 mg total) by mouth daily with breakfast., Disp: 100 tablet, Rfl: 1   glucose blood test strip, Use as instructedCheck blood sugar three times daily. E11.40, Disp: 100 each, Rfl: 12   insulin glargine (LANTUS) 100 UNIT/ML Solostar Pen, Inject 30 Units into the skin at bedtime., Disp: 15 mL, Rfl: 3   insulin lispro (HUMALOG KWIKPEN) 100 UNIT/ML KwikPen, Inject 4 units before breakfast and dinner., Disp: 15 mL, Rfl: 3   Insulin Pen Needle 32G X 4 MM MISC, Use to inject insulin as directed., Disp: 100 each, Rfl: 4   metolazone (ZAROXOLYN) 2.5 MG tablet, Take 1 tablet (2.5 mg total) by mouth once for 1 dose., Disp: 1 tablet, Rfl: 0   risperiDONE (RISPERDAL) 3 MG tablet, TAKE 1 TABLET (3 MG TOTAL) BY MOUTH AT BEDTIME., Disp: 30 tablet, Rfl: 2   torsemide (DEMADEX) 20 MG  tablet, Take 3 tablets (60 mg total) by mouth every morning AND 2 tablets (40 mg total) every evening., Disp: 150 tablet, Rfl: 6 Allergies  Allergen Reactions   Elemental Sulfur Hives and Other (See Comments)    PATIENT STATED THIS, MORE THAN LIKELY, SHOULD HAVE BEEN LOGGED AS "SULFA"   Hydralazine Hcl Other (See Comments)    Hair loss   Hydrocodone Itching and Other (See Comments)    Upset stomach   Metformin And Related Nausea And Vomiting and Other (See Comments)    Stomach pains, also   Other Nausea Only and Other (See Comments)    Lettuce- Does not digest this!!   Plaquenil [Hydroxychloroquine Sulfate] Hives   Shellfish-Derived Products Nausea Only and Other (See Comments)    Caused an upset stomach   Shrimp (Diagnostic) Nausea Only and Other (See Comments)    Upset stomach    Sulfa Antibiotics Hives  Social History   Socioeconomic History   Marital status: Legally Separated    Spouse name: [name]   Number of children: 1   Years of education: some colle   Highest education level: Not on file  Occupational History   Occupation: Employed FT as Landscape architect    Comment: [employer] , NA  Tobacco Use   Smoking status: Former    Packs/day: 0.25    Years: 0.50    Pack years: 0.13    Types: Cigarettes   Smokeless tobacco: Never   Tobacco comments:    Former smoker 11/03/21  Vaping Use   Vaping Use: Never used  Substance and Sexual Activity   Alcohol use: No   Drug use: No   Sexual activity: Yes    Birth control/protection: None  Other Topics Concern   Not on file  Social History Narrative   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    Social Determinants of Health   Financial Resource Strain: Low Risk    Difficulty of Paying Living Expenses: Not very hard  Food Insecurity: No Food Insecurity   Worried About Charity fundraiser in the Last Year: Never true   Ran Out of Food in the Last Year: Never true  Transportation Needs: No  Transportation Needs   Lack of Transportation (Medical): No   Lack of Transportation (Non-Medical): No  Physical Activity: Not on file  Stress: Not on file  Social Connections: Not on file  Intimate Partner Violence: Not on file    Physical Exam      Future Appointments  Date Time Provider [place]  12/14/2021 10:45 AM Ricard Dillon, MD [facility]  12/29/2021  3:30 PM Laurice Record, MD [facility]ID [facility]  01/05/2022 10:00 AM MC-CV NL VASC 3 [facility]I [facility]  01/05/2022 12:00 PM Larey Dresser, MD MC-HVSC None  05/18/2022 11:15 AM [name], MD [facility]-NORTHLIN [facility]     ACTION: Home visit completed

## 2021-12-13 NOTE — Telephone Encounter (Signed)
17:15- Nancy [name] Stable and she reports she feels about the same with no improvements. Not peeing much since IV lasix dose at 11:00. She says she peed 4 times since then. She has not received the Metolazone or Potassium yet.  (She did take her morning dose of Torsemide at 0900 prior to my visit today)   I will follow up with pharmacy for med delivery and see patient in person tomorrow for follow up visit.   -This will be fowarded to Janett Billow NP at HF clinic.

## 2021-12-13 NOTE — Telephone Encounter (Signed)
Heather with Paramedicine called to report patient has increased SOB, pt reports she feels really bad, + swelling in all 4 extremities, weight is more than likely increased  Pt reports compliance with medications daily, no longer has unna boots  Will forward to provider for med changes

## 2021-12-14 ENCOUNTER — Other Ambulatory Visit: Payer: Self-pay

## 2021-12-14 ENCOUNTER — Emergency Department (HOSPITAL_COMMUNITY): Payer: Medicaid Other

## 2021-12-14 ENCOUNTER — Emergency Department (HOSPITAL_COMMUNITY)
Admission: EM | Admit: 2021-12-14 | Discharge: 2021-12-14 | Disposition: A | Discharge status: Home / Self Care | Payer: Medicaid Other | Attending: Emergency Medicine | Admitting: Emergency Medicine

## 2021-12-14 ENCOUNTER — Encounter (HOSPITAL_BASED_OUTPATIENT_CLINIC_OR_DEPARTMENT_OTHER): Payer: Medicaid Other | Admitting: Internal Medicine

## 2021-12-14 ENCOUNTER — Other Ambulatory Visit (HOSPITAL_COMMUNITY): Payer: Self-pay

## 2021-12-14 ENCOUNTER — Encounter (HOSPITAL_COMMUNITY): Payer: Self-pay | Admitting: Emergency Medicine

## 2021-12-14 DIAGNOSIS — I130 Hypertensive heart and chronic kidney disease with heart failure and stage 1 through stage 4 chronic kidney disease, or unspecified chronic kidney disease: Secondary | ICD-10-CM | POA: Diagnosis not present

## 2021-12-14 DIAGNOSIS — Z79899 Other long term (current) drug therapy: Secondary | ICD-10-CM | POA: Insufficient documentation

## 2021-12-14 DIAGNOSIS — N184 Chronic kidney disease, stage 4 (severe): Secondary | ICD-10-CM | POA: Diagnosis not present

## 2021-12-14 DIAGNOSIS — R0602 Shortness of breath: Secondary | ICD-10-CM | POA: Insufficient documentation

## 2021-12-14 DIAGNOSIS — R600 Localized edema: Secondary | ICD-10-CM | POA: Diagnosis not present

## 2021-12-14 DIAGNOSIS — I509 Heart failure, unspecified: Secondary | ICD-10-CM

## 2021-12-14 DIAGNOSIS — I5033 Acute on chronic diastolic (congestive) heart failure: Secondary | ICD-10-CM | POA: Diagnosis not present

## 2021-12-14 DIAGNOSIS — Z955 Presence of coronary angioplasty implant and graft: Secondary | ICD-10-CM | POA: Insufficient documentation

## 2021-12-14 DIAGNOSIS — E1143 Type 2 diabetes mellitus with diabetic autonomic (poly)neuropathy: Secondary | ICD-10-CM | POA: Diagnosis not present

## 2021-12-14 DIAGNOSIS — I2510 Atherosclerotic heart disease of native coronary artery without angina pectoris: Secondary | ICD-10-CM | POA: Diagnosis not present

## 2021-12-14 DIAGNOSIS — E1140 Type 2 diabetes mellitus with diabetic neuropathy, unspecified: Secondary | ICD-10-CM | POA: Diagnosis not present

## 2021-12-14 DIAGNOSIS — Z87891 Personal history of nicotine dependence: Secondary | ICD-10-CM | POA: Diagnosis not present

## 2021-12-14 DIAGNOSIS — E1149 Type 2 diabetes mellitus with other diabetic neurological complication: Secondary | ICD-10-CM | POA: Insufficient documentation

## 2021-12-14 DIAGNOSIS — Z7982 Long term (current) use of aspirin: Secondary | ICD-10-CM | POA: Insufficient documentation

## 2021-12-14 DIAGNOSIS — Z794 Long term (current) use of insulin: Secondary | ICD-10-CM | POA: Diagnosis not present

## 2021-12-14 LAB — CBC WITH DIFFERENTIAL/PLATELET
Abs Immature Granulocytes: 0.02 10*3/uL (ref 0.00–0.07)
Basophils Absolute: 0 10*3/uL (ref 0.0–0.1)
Basophils Relative: 0 %
Eosinophils Absolute: 0.6 10*3/uL — ABNORMAL HIGH (ref 0.0–0.5)
Eosinophils Relative: 9 %
HCT: 28.9 % — ABNORMAL LOW (ref 36.0–46.0)
Hemoglobin: 8.8 g/dL — ABNORMAL LOW (ref 12.0–15.0)
Immature Granulocytes: 0 %
Lymphocytes Relative: 19 %
Lymphs Abs: 1.4 10*3/uL (ref 0.7–4.0)
MCH: 29.5 pg (ref 26.0–34.0)
MCHC: 30.4 g/dL (ref 30.0–36.0)
MCV: 97 fL (ref 80.0–100.0)
Monocytes Absolute: 0.7 10*3/uL (ref 0.1–1.0)
Monocytes Relative: 11 %
Neutro Abs: 4.2 10*3/uL (ref 1.7–7.7)
Neutrophils Relative %: 61 %
Platelets: 254 10*3/uL (ref 150–400)
RBC: 2.98 MIL/uL — ABNORMAL LOW (ref 3.87–5.11)
RDW: 15.2 % (ref 11.5–15.5)
WBC: 7 10*3/uL (ref 4.0–10.5)
nRBC: 0 % (ref 0.0–0.2)

## 2021-12-14 LAB — BASIC METABOLIC PANEL
Anion gap: 10 (ref 5–15)
BUN: 39 mg/dL — ABNORMAL HIGH (ref 8–23)
CO2: 26 mmol/L (ref 22–32)
Calcium: 8.9 mg/dL (ref 8.9–10.3)
Chloride: 99 mmol/L (ref 98–111)
Creatinine, Ser: 4.01 mg/dL — ABNORMAL HIGH (ref 0.44–1.00)
GFR, Estimated: 12 mL/min — ABNORMAL LOW (ref >60)
Glucose, Bld: 130 mg/dL — ABNORMAL HIGH (ref 70–99)
Potassium: 4.2 mmol/L (ref 3.5–5.1)
Sodium: 135 mmol/L (ref 135–145)

## 2021-12-14 LAB — TROPONIN I (HIGH SENSITIVITY)
Troponin I (High Sensitivity): 10 ng/L (ref <18)
Troponin I (High Sensitivity): 10 ng/L (ref <18)

## 2021-12-14 LAB — BRAIN NATRIURETIC PEPTIDE: B Natriuretic Peptide: 409.7 pg/mL — ABNORMAL HIGH (ref 0.0–100.0)

## 2021-12-14 MED ORDER — IPRATROPIUM-ALBUTEROL 0.5-2.5 (3) MG/3ML IN SOLN
3.0000 mL | Freq: Once | RESPIRATORY_TRACT | Status: AC
  Administered 2021-12-14: 20:00:00 3 mL via RESPIRATORY_TRACT
  Filled 2021-12-14: qty 3

## 2021-12-14 MED ORDER — FUROSEMIDE 10 MG/ML IJ SOLN
80.0000 mg | Freq: Once | INTRAMUSCULAR | Status: AC
  Administered 2021-12-14: 20:00:00 80 mg via INTRAVENOUS
  Filled 2021-12-14: qty 8

## 2021-12-14 NOTE — ED Provider Notes (Signed)
Emergency Medicine Provider Triage Evaluation Note  Nancy [name] , a 60 y.o. female  was evaluated in triage.  Pt complains of edema noted to bilateral lower extremities x3 days.  Patient reports her lower extremity edema has been going on for 2-3 weeks.  Takes Lasix for CHF.  Denies leg pain, chest pain, shortness of breath, fever, chills, abdominal pain, nausea, vomiting.  She ambulates with a cane at baseline.  Review of Systems  Positive: Bilateral lower extremity edema Negative: Leg pain, nausea, vomiting  Physical Exam  BP (!) 147/70 (BP Location: Left Arm)    Pulse 73    Temp 98.5 F (36.9 C) (Oral)    Resp 18    SpO2 98%  Gen:   Awake, no distress   Resp:  Normal effort, clear to auscultation bilaterally. MSK:   Moves extremities without difficulty  Other:  Bilateral lower extremity edema with 3+ pitting edema.  No tenderness to palpation to bilateral lower extremities.  Medical Decision Making  Medically screening exam initiated at 12:18 PM.  Appropriate orders placed.  [name] was informed that the remainder of the evaluation will be completed by another provider, this initial triage assessment does not replace that evaluation, and the importance of remaining in the ED until their evaluation is complete.   Aloni Chuang A, PA-C 12/14/21 1224    Wyvonnia Dusky, MD 12/14/21 [phone]

## 2021-12-14 NOTE — Discharge Instructions (Addendum)
Return to the ER if you develop new or worsening shortness of breath, shortness of breath does not improve, or if your swelling does not improve or if you develop any other new/concerning symptoms.  Take the increased torsemide as prescribed by cardiology.

## 2021-12-14 NOTE — ED Provider Notes (Signed)
[facility] EMERGENCY DEPARTMENT Provider Note   CSN: [id number] Arrival date & time: 12/14/21  1211     History Chief Complaint  Patient presents with   Edema    Nancy [name] is a 60 y.o. female with history of type 2 diabetes, HTN, stage IV chronic kidney disease, CHF, CAD, peripheral artery disease who presents the emergency department with chief complaint of edema and shortness of breath.  Patient had persistent edema going on for several months now associated with her heart failure.  She was seen by para medicine per her cardiologist yesterday and was given 80 mg IV Lasix and 2.5 mg metolazone, and they changed her home diuretics.  They added metolazone weekly, and increased her torsemide to 80 mg twice daily.  She states she had worsening of her shortness of breath since the medic left her house yesterday. No recent illness or infectious symptoms.   HPI     Past Medical History:  Diagnosis Date   Anemia 2006   Depression 2014   previously on amitryptiline    Diabetes mellitus with neurological manifestation ([place]) 2006   Diabetic peripheral neuropathy ([place]) 04/26/2020   Fracture of left ankle 1997    Gastroparesis 07/2016   HOH (hard of hearing) 2004    Hyperlipidemia 2006   Hypertension 2006   IBS (irritable bowel syndrome) 2002    Leukopenia 2015    Macular degeneration 11/2019   Shingles 2009    Stroke [facility]) 2020   Thyroid nodule 2004    Patient Active Problem List   Diagnosis Date Noted   Critical limb ischemia of both lower extremities ([place]) 11/22/2021   Osteomyelitis ([place])    Peripheral artery disease ([place]) 10/27/2021   Acute exacerbation of congestive heart failure ([place]) 10/24/2021   Acute cardiogenic pulmonary edema ([place]) 10/06/2021   Coronary artery disease involving native coronary artery of native heart without angina pectoris 10/06/2021   Goals of care, counseling/discussion    Gangrene of left foot ([place])    Cellulitis of  left foot    Diabetic ulcer of left foot ([place]) 09/04/2021   CHF (congestive heart failure) ([place]) 07/07/2021   Normocytic anemia 06/30/2021   CKD (chronic kidney disease), stage IV ([place]) 06/11/2021   Acute on chronic diastolic CHF (congestive heart failure) ([place]) 06/10/2021   Abnormal nuclear stress test    Dyspnea on exertion 03/07/2021   Orthopnea 02/25/2021   History of cerebrovascular accident (CVA) with residual deficit 05/06/2020   Diabetic peripheral neuropathy ([place]) 04/26/2020   AKI (acute kidney injury) ([place]) 04/20/2020   Generalized weakness 04/20/2020   Dehydration 04/20/2020   Generalized abdominal pain    Pancreatitis, acute 02/29/2020   Cerebrovascular accident (CVA) ([place]) 11/08/2019   Stenosis of right carotid artery 11/08/2019   Mixed diabetic hyperlipidemia associated with type 2 diabetes mellitus ([place]) 11/08/2019   Cortical age-related cataract of both eyes 08/30/2019   Gait abnormality 08/30/2019   Statin declined 08/30/2019   Gastroesophageal reflux disease without esophagitis 04/18/2018   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   Type 2 diabetes mellitus with stage 4 chronic kidney disease, with long-term current use of insulin ([place]) 10/29/2017   History of macular degeneration 10/29/2017   Chronic cervical pain 10/29/2016   Myalgia 09/14/2016   Insomnia 09/14/2016   Tinea pedis of both feet 08/20/2016   Macular degeneration 08/17/2016   Low back pain 09/21/2014   Hypertensive urgency 09/21/2014   Diabetic gastroparesis associated with type 2 diabetes mellitus ([place]) 09/14/2014   Uncontrolled  hypertension 09/08/2014   Thyroid nodule 08/17/2014   Obesity (BMI 30-39.9) 08/17/2014   Nausea & vomiting 08/04/2014   Type II diabetes mellitus with neurological manifestations, uncontrolled 08/04/2014    Past Surgical History:  Procedure Laterality Date   ABDOMINAL HYSTERECTOMY  2005   CATARACT EXTRACTION Left 11/2019   CESAREAN SECTION  1983    CHOLECYSTECTOMY N/A 03/05/2020    Procedure: LAPAROSCOPIC CHOLECYSTECTOMY WITH INTRAOPERATIVE CHOLANGIOGRAM;  Surgeon: Donnie Mesa, MD;  Location: [facility];  Service: General;  Laterality: N/A;   ESOPHAGOGASTRODUODENOSCOPY (EGD) WITH PROPOFOL Left 08/26/2014   Procedure: ESOPHAGOGASTRODUODENOSCOPY (EGD) WITH PROPOFOL;  Surgeon: Arta Silence, MD;  Location: [facility];  Service: Endoscopy;  Laterality: Left;   ESOPHAGOGASTRODUODENOSCOPY (EGD) WITH PROPOFOL N/A 03/03/2020   Procedure: ESOPHAGOGASTRODUODENOSCOPY (EGD) WITH PROPOFOL;  Surgeon: Jerene Bears, MD;  Location: [facility];  Service: Gastroenterology;  Laterality: N/A;   RIGHT HEART CATH N/A 07/14/2021   Procedure: RIGHT HEART CATH;  Surgeon: Larey Dresser, MD;  Location: [facility] CV LAB;  Service: Cardiovascular;  Laterality: N/A;   RIGHT/LEFT HEART CATH AND CORONARY ANGIOGRAPHY N/A 04/27/2021   Procedure: RIGHT/LEFT HEART CATH AND CORONARY ANGIOGRAPHY;  Surgeon: [name], MD;  Location: [facility] CV LAB;  Service: Cardiovascular;  Laterality: N/A;     OB History   No obstetric history on file.     Family History  Problem Relation Age of Onset   Hypertension Mother    Heart disease Mother    Diabetes Mother    Thyroid disease Mother    Congestive Heart Failure Mother    Breast cancer Maternal Grandmother    Colon cancer Maternal Grandfather    Heart attack Sister    Heart disease Brother    Hyperlipidemia Brother    Hypertension Brother    Diabetes Father    Breast cancer Maternal Aunt     Social History   Tobacco Use   Smoking status: Former    Packs/day: 0.25    Years: 0.50    Pack years: 0.13    Types: Cigarettes   Smokeless tobacco: Never   Tobacco comments:    Former smoker 11/03/21  Vaping Use   Vaping Use: Never used  Substance Use Topics   Alcohol use: No   Drug use: No    Home Medications Prior to Admission medications   Medication Sig Start Date End Date Taking? Authorizing Provider  acetaminophen  (TYLENOL) 500 MG tablet Take 500 mg by mouth every 6 (six) hours as needed for moderate pain or headache.    [provider]  amLODipine (NORVASC) 10 MG tablet Take 1 tablet (10 mg total) by mouth daily. 11/07/21   Larey Dresser, MD  aspirin 81 MG EC tablet Take 1 tablet (81 mg total) by mouth daily. 09/26/21   [place], Maricela Bo, FNP  atorvastatin (LIPITOR) 40 MG tablet Take 1 tablet (40 mg total) by mouth daily. 11/07/21   Larey Dresser, MD  Blood Glucose Monitoring Suppl [facility] VERIO) w/Device KIT Check blood sugar three times daily. E11.40 05/06/20   [name], MD  calcium acetate (PHOSLO) 667 MG capsule Take 1 capsule (667 mg total) by mouth 3 (three) times daily with meals. 11/07/21   [name], MD  carvedilol (COREG) 12.5 MG tablet Take 1.5 tablets (18.75 mg total) by mouth 2 (two) times daily with a meal. 11/07/21   Larey Dresser, MD  Continuous Blood Gluc Receiver (DEXCOM G6 RECEIVER) DEVI 1 Device by Does not  apply route daily. 11/15/21   [name], MD  Continuous Blood Gluc Sensor (DEXCOM G6 SENSOR) MISC 1 packet by Does not apply route daily. 11/15/21   [name], MD  Continuous Blood Gluc Transmit (DEXCOM G6 TRANSMITTER) MISC 1 packet by Does not apply route daily. 11/15/21   [name], MD  ferrous sulfate 325 (65 FE) MG tablet Take 1 tablet (325 mg total) by mouth daily with breakfast. 11/07/21   [name], MD  glucose blood test strip Use as instructedCheck blood sugar three times daily. E11.40 07/11/20   [name], MD  insulin glargine (LANTUS) 100 UNIT/ML Solostar Pen Inject 30 Units into the skin at bedtime. 11/07/21   [name], MD  insulin lispro (HUMALOG KWIKPEN) 100 UNIT/ML KwikPen Inject 4 units before breakfast and dinner. 11/07/21   [name], MD  Insulin Pen Needle 32G X 4 MM MISC Use to inject insulin as directed. 11/07/21   [name], MD  metolazone (ZAROXOLYN) 2.5  MG tablet Take 1 tablet (2.5 mg total) by mouth once for 1 dose. 12/13/21 12/14/21  Rafael Bihari, FNP  potassium chloride SA (KLOR-CON M) 20 MEQ tablet Take 1 tablet (20 mEq total) by mouth once for 1 dose. 12/13/21 12/14/21  Rafael Bihari, FNP  risperiDONE (RISPERDAL) 3 MG tablet TAKE 1 TABLET (3 MG TOTAL) BY MOUTH AT BEDTIME. 02/06/21 02/06/22  Eulis Canner E, NP  torsemide (DEMADEX) 20 MG tablet Take 4 tablets (80 mg total) by mouth 2 (two) times daily. 12/13/21   Rafael Bihari, FNP  Insulin NPH Isophane & Regular (RELION 70/30 [place]) Inject 35 Units into the skin 2 (two) times daily.  08/17/14  [provider]    Allergies    Hydralazine hcl, Hydrocodone, Metformin and related, Other, Plaquenil [hydroxychloroquine sulfate], Shellfish-derived products, Shrimp (diagnostic), and Sulfa antibiotics  Review of Systems   Review of Systems  Constitutional:  Negative for chills and fever.  HENT:  Negative for congestion.   Respiratory:  Positive for cough, shortness of breath and wheezing.   Cardiovascular:  Positive for leg swelling. Negative for chest pain.  Gastrointestinal:  Negative for abdominal pain, constipation, diarrhea, nausea and vomiting.  Genitourinary:  Negative for decreased urine volume and difficulty urinating.  Musculoskeletal:  Positive for back pain.  Neurological:  Negative for dizziness and light-headedness.  All other systems reviewed and are negative.  Physical Exam Updated Vital Signs BP (!) 148/75    Pulse 75    Temp 98.5 F (36.9 C) (Oral)    Resp 19    SpO2 100%   Physical Exam Vitals and nursing note reviewed.  Constitutional:      Appearance: She is obese.     Comments: Patient is chronically ill-appearing  HENT:     Head: Normocephalic and atraumatic.  Eyes:     Conjunctiva/sclera: Conjunctivae normal.  Neck:     Vascular: No JVD.  Cardiovascular:     Rate and Rhythm: Normal rate and regular rhythm.     Comments: Bilateral 3+  pitting edema up to the level of the mid thighs. Pulmonary:     Effort: Pulmonary effort is normal. No respiratory distress.     Comments: Diffuse expiratory wheezing in all lung fields.  Good air movement. Abdominal:     General: There is distension.     Palpations: Abdomen is soft.     Tenderness: There is no abdominal tenderness. There is no guarding or rebound.  Comments: Abdomen is distended, nontender.  Musculoskeletal:     Right lower leg: 3+ Pitting Edema present.     Left lower leg: 3+ Pitting Edema present.  Skin:    General: Skin is warm and dry.  Neurological:     General: No focal deficit present.     Mental Status: She is alert.    ED Results / Procedures / Treatments   Labs (all labs ordered are listed, but only abnormal results are displayed) Labs Reviewed  BASIC METABOLIC PANEL - Abnormal; Notable for the following components:      Result Value   Glucose, Bld 130 (*)    BUN 39 (*)    Creatinine, Ser 4.01 (*)    GFR, Estimated 12 (*)    All other components within normal limits  CBC WITH DIFFERENTIAL/PLATELET - Abnormal; Notable for the following components:   RBC 2.98 (*)    Hemoglobin 8.8 (*)    HCT 28.9 (*)    Eosinophils Absolute 0.6 (*)    All other components within normal limits  BRAIN NATRIURETIC PEPTIDE - Abnormal; Notable for the following components:   B Natriuretic Peptide 409.7 (*)    All other components within normal limits  TROPONIN I (HIGH SENSITIVITY)  TROPONIN I (HIGH SENSITIVITY)    EKG None  Radiology DG Chest 2 View  Result Date: 12/14/2021 CLINICAL DATA:  Shortness of breath EXAM: CHEST - 2 VIEW COMPARISON:  Chest x-ray dated October 24, 2021 FINDINGS: Cardiac and mediastinal contours are unchanged. Bilateral interstitial opacities with mild areas of consolidation. Small right pleural effusion. No evidence of pneumothorax. IMPRESSION: 1. Bilateral interstitial opacities with mild areas of consolidation, likely due to  pulmonary edema. 2. Small right pleural effusion. Electronically Signed   By: Yetta Glassman M.D.   On: 12/14/2021 12:43    Procedures Procedures   Medications Ordered in ED Medications  furosemide (LASIX) injection 80 mg (has no administration in time range)  ipratropium-albuterol (DUONEB) 0.5-2.5 (3) MG/3ML nebulizer solution 3 mL (has no administration in time range)    ED Course  I have reviewed the triage vital signs and the nursing notes.  Pertinent labs & imaging results that were available during my care of the patient were reviewed by me and considered in my medical decision making (see chart for details).    MDM Rules/Calculators/A&P                          Patient is a 60 year old female with history of type 2 diabetes, HTN, stage IV chronic kidney disease, CHF, CAD, peripheral artery disease who presents the emergency department with chief complaint of edema and shortness of breath.  On my exam patient is afebrile, not tachycardic, not hypoxic, and in no acute distress. She has expiratory wheezing in all lung fields. Bilateral 3+ pitting edema in the bilateral lower extremities up to the level of the  mid-thighs.  Will give an additional 80 mg Lasix IV and observe patient.  6:50pm Patient discussed and care transferred to attending physician Dr. Regenia Skeeter at shift change. See his note for disposition. Anticipate observing patient to evaluate if she meets criteria for admission.   Final Clinical Impression(s) / ED Diagnoses Final diagnoses:  Peripheral edema  Shortness of breath    Rx / DC Orders ED Discharge Orders     None      Portions of this report may have been transcribed using voice recognition software. Every effort was  made to ensure accuracy; however, inadvertent computerized transcription errors may be present.    Estill Cotta 12/14/21 1851    Sherwood Gambler, MD 12/14/21 2226

## 2021-12-14 NOTE — ED Notes (Signed)
Pt O2 maintain at 97-98% while standing and walking.

## 2021-12-14 NOTE — ED Triage Notes (Addendum)
Pt to triage via [facility] from home.  C/o increased lower extremity x 3 days.  Taking Lasix as prescribed.  Denies SOB and chest pain at present.  States she occasionally has SOB.  Lower lobe rhonchi.  96% on room air. Hx of CHF.

## 2021-12-14 NOTE — Progress Notes (Signed)
Paramedicine Encounter    Patient ID: Nancy [name], female    DOB: December 04, 1961, 60 y.o.   MRN: [id number]  Follow up visit from yesterday in the home. Nancy [name]'s weight went from 239lbs to 248lbs with her digital at home scale. Her legs are still swollen and tight. Lung sounds improved but rales and rhonchi still noted. She reports not feeling any better despite IV LASIX 66m yesterday at 11:00 and increase in TORSEMIDE to 836mlast night and this morning. She also had single dose of METOLAZONE 2.42m9mnd POTASSIUM 20MEQ last night at 19:00 per Nancy [name] with [facility].   Vitals obtained and reported to Nancy [name] Nancy [name] ER despite our efforts. Nancy [name] agreed stating, "I need to be in the hospital".   CBG- 128  EMS arrived and she was assisted onto stretcher and moved in to their unit and transported to [facility] I will follow up with her after she is discharged. Visit complete.        Patient Care Team: JohLadell PierD as PCP - General (Internal Medicine) BerLorretta HarpD as PCP - Cardiology (Cardiology) McLLarey DresserD as PCP - Advanced Heart Failure (Cardiology)  Patient Active Problem List   Diagnosis Date Noted   Critical limb ischemia of both lower extremities (HCCConcord2/06/2021   Osteomyelitis (HCCGarrett  Peripheral artery disease (HCCHolbrook1/10/2021   Acute exacerbation of congestive heart failure (HCCYatesville1/07/2021   Acute cardiogenic pulmonary edema (HCCNorth Plymouth0/21/2022   Coronary artery disease involving native coronary artery of native heart without angina pectoris 10/06/2021   Goals of care, counseling/discussion    Gangrene of left foot (HCCWalstonburg  Cellulitis of left foot    Diabetic ulcer of left foot (HCCPowder Springs9/19/2022   CHF (congestive heart failure) (HCCCambridge7/22/2022   Normocytic anemia 06/30/2021   CKD (chronic kidney disease), stage IV (HCCLetona6/26/2022   Acute on chronic diastolic CHF (congestive heart failure) (HCCCow Creek06/25/2022   Abnormal nuclear stress test    Dyspnea on exertion 03/07/2021   Orthopnea 02/25/2021   History of cerebrovascular accident (CVA) with residual deficit 05/06/2020   Diabetic peripheral neuropathy (HCCHollowayville5/10/2020   AKI (acute kidney injury) (HCCDuryea5/04/2020   Generalized weakness 04/20/2020   Dehydration 04/20/2020   Generalized abdominal pain    Pancreatitis, acute 02/29/2020   Cerebrovascular accident (CVA) (HCCJordan1/22/2020   Stenosis of right carotid artery 11/08/2019   Mixed diabetic hyperlipidemia associated with type 2 diabetes mellitus (HCCClearmont1/22/2020   Cortical age-related cataract of both eyes 08/30/2019   Gait abnormality 08/30/2019   Statin declined 08/30/2019   Gastroesophageal reflux disease without esophagitis 04/18/2018   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   Type 2 diabetes mellitus with stage 4 chronic kidney disease, with long-term current use of insulin (HCCMoapa Town1/13/2018   History of macular degeneration 10/29/2017   Chronic cervical pain 10/29/2016   Myalgia 09/14/2016   Insomnia 09/14/2016   Tinea pedis of both feet 08/20/2016   Macular degeneration 08/17/2016   Low back pain 09/21/2014   Hypertensive urgency 09/21/2014   Diabetic gastroparesis associated with type 2 diabetes mellitus (HCCWetmore9/29/2015   Uncontrolled hypertension 09/08/2014   Thyroid nodule 08/17/2014   Obesity (BMI 30-39.9) 08/17/2014   Nausea & vomiting 08/04/2014   Type II diabetes mellitus with neurological manifestations, uncontrolled 08/04/2014    Current Outpatient Medications:    acetaminophen (TYLENOL) 500 MG tablet, Take 500 mg by mouth every 6 (six) hours  as needed for moderate pain or headache., Disp: , Rfl:    amLODipine (NORVASC) 10 MG tablet, Take 1 tablet (10 mg total) by mouth daily., Disp: 30 tablet, Rfl: 6   aspirin 81 MG EC tablet, Take 1 tablet (81 mg total) by mouth daily., Disp: 60 tablet, Rfl: 1   atorvastatin (LIPITOR) 40 MG tablet, Take 1 tablet (40 mg total) by  mouth daily., Disp: 30 tablet, Rfl: 6   Blood Glucose Monitoring Suppl (ONETOUCH VERIO) w/Device KIT, Check blood sugar three times daily. E11.40, Disp: 1 kit, Rfl: 0   calcium acetate (PHOSLO) 667 MG capsule, Take 1 capsule (667 mg total) by mouth 3 (three) times daily with meals., Disp: 90 capsule, Rfl: 3   carvedilol (COREG) 12.5 MG tablet, Take 1.5 tablets (18.75 mg total) by mouth 2 (two) times daily with a meal., Disp: 90 tablet, Rfl: 6   Continuous Blood Gluc Receiver (DEXCOM G6 RECEIVER) DEVI, 1 Device by Does not apply route daily., Disp: 1 each, Rfl: 0   Continuous Blood Gluc Sensor (DEXCOM G6 SENSOR) MISC, 1 packet by Does not apply route daily., Disp: 3 each, Rfl: 11   Continuous Blood Gluc Transmit (DEXCOM G6 TRANSMITTER) MISC, 1 packet by Does not apply route daily., Disp: 1 each, Rfl: 4   ferrous sulfate 325 (65 FE) MG tablet, Take 1 tablet (325 mg total) by mouth daily with breakfast., Disp: 100 tablet, Rfl: 1   glucose blood test strip, Use as instructedCheck blood sugar three times daily. E11.40, Disp: 100 each, Rfl: 12   insulin glargine (LANTUS) 100 UNIT/ML Solostar Pen, Inject 30 Units into the skin at bedtime., Disp: 15 mL, Rfl: 3   insulin lispro (HUMALOG KWIKPEN) 100 UNIT/ML KwikPen, Inject 4 units before breakfast and dinner., Disp: 15 mL, Rfl: 3   Insulin Pen Needle 32G X 4 MM MISC, Use to inject insulin as directed., Disp: 100 each, Rfl: 4   metolazone (ZAROXOLYN) 2.5 MG tablet, Take 1 tablet (2.5 mg total) by mouth once for 1 dose., Disp: 1 tablet, Rfl: 0   potassium chloride SA (KLOR-CON M) 20 MEQ tablet, Take 1 tablet (20 mEq total) by mouth once for 1 dose., Disp: 10 tablet, Rfl: 0   risperiDONE (RISPERDAL) 3 MG tablet, TAKE 1 TABLET (3 MG TOTAL) BY MOUTH AT BEDTIME., Disp: 30 tablet, Rfl: 2   torsemide (DEMADEX) 20 MG tablet, Take 4 tablets (80 mg total) by mouth 2 (two) times daily., Disp: 150 tablet, Rfl: 6 Allergies  Allergen Reactions   Hydralazine Hcl Other (See  Comments)    Hair loss   Hydrocodone Itching and Other (See Comments)    Upset stomach   Metformin And Related Nausea And Vomiting and Other (See Comments)    Stomach pains, also   Other Nausea Only and Other (See Comments)    Lettuce- Does not digest this!!   Plaquenil [Hydroxychloroquine Sulfate] Hives   Shellfish-Derived Products Nausea Only and Other (See Comments)    Caused an upset stomach   Shrimp (Diagnostic) Nausea Only and Other (See Comments)    Upset stomach    Sulfa Antibiotics Hives     Social History   Socioeconomic History   Marital status: Legally Separated    Spouse name: Nancy [name]   Number of children: 1   Years of education: some colle   Highest education level: Not on file  Occupational History   Occupation: Employed FT as Landscape architect    Comment: [employer] , NA  Tobacco Use  Smoking status: Former    Packs/day: 0.25    Years: 0.50    Pack years: 0.13    Types: Cigarettes   Smokeless tobacco: Never   Tobacco comments:    Former smoker 11/03/21  Vaping Use   Vaping Use: Never used  Substance and Sexual Activity   Alcohol use: No   Drug use: No   Sexual activity: Yes    Birth control/protection: None  Other Topics Concern   Not on file  Social History Narrative   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  Constitutional:      Appearance: She is  obese.  HENT:     Head: Normocephalic.     Nose: Nose normal.     Mouth/Throat:     Mouth: Mucous membranes are moist.     Pharynx: Oropharynx is clear.  Eyes:     Conjunctiva/sclera: Conjunctivae normal.     Pupils: Pupils are equal, round, and reactive to light.  Cardiovascular:     Rate and Rhythm: Normal rate and regular rhythm.     Pulses: Normal pulses.     Heart sounds: Normal heart sounds.  Pulmonary:     Effort: Respiratory distress present.     Breath sounds: Rhonchi and rales present.  Abdominal:     Palpations: Abdomen is soft.  Musculoskeletal:        General: Swelling present.     Cervical back: Normal range of motion.     Right lower leg: Edema present.     Left lower leg: Edema present.  Skin:    General: Skin is warm and dry.     Capillary Refill: Capillary refill takes less than 2 seconds.  Neurological:     General: No focal deficit present.     Mental Status: She is alert. Mental status is at baseline.  Psychiatric:        Mood and Affect: Mood normal.        Future Appointments  Date Time Provider [place]  12/29/2021  3:30 PM Laurice Record, MD [facility]  01/05/2022 10:00 AM [facility] NL VASC 3 [facility]  01/05/2022 12:00 PM Larey Dresser, MD [facility] None  05/18/2022 11:15 AM [name], MD [facility]     ACTION: Home visit completed

## 2021-12-17 ENCOUNTER — Encounter (HOSPITAL_COMMUNITY): Payer: Self-pay

## 2021-12-20 ENCOUNTER — Other Ambulatory Visit: Payer: Self-pay

## 2021-12-20 ENCOUNTER — Encounter (HOSPITAL_COMMUNITY): Payer: Self-pay | Admitting: Vascular Surgery

## 2021-12-20 ENCOUNTER — Other Ambulatory Visit (HOSPITAL_COMMUNITY): Payer: Self-pay

## 2021-12-20 NOTE — Anesthesia Preprocedure Evaluation (Addendum)
Anesthesia Evaluation  Patient identified by MRN, date of birth, ID band Patient awake    Reviewed: Allergy & Precautions, NPO status , Patient's Chart, lab work & pertinent test results  History of Anesthesia Complications Negative for: history of anesthetic complications  Airway Mallampati: III  TM Distance: >3 FB Neck ROM: Full    Dental  (+) Dental Advisory Given   Pulmonary shortness of breath and with exertion, former smoker,     + decreased breath sounds      Cardiovascular hypertension, Pt. on home beta blockers and Pt. on medications + CAD, + Peripheral Vascular Disease and +CHF  + Valvular Problems/Murmurs  Rhythm:Regular  Echo 10/06/21: IMPRESSIONS  1. LV function low normal to mildly reduced.  2. Left ventricular ejection fraction, by estimation, is 50 to 55%. The  left ventricle has low normal function. The left ventricle has no regional  wall motion abnormalities. There is moderate left ventricular hypertrophy.  Left ventricular diastolic  parameters are consistent with Grade II diastolic dysfunction  (pseudonormalization). Elevated left atrial pressure.  3. Right ventricular systolic function is normal. The right ventricular  size is normal.  4. Left atrial size was mildly dilated.  5. The mitral valve is normal in structure. No evidence of mitral valve  regurgitation. No evidence of mitral stenosis.  6. The aortic valve is tricuspid. Aortic valve regurgitation is not  visualized. No aortic stenosis is present.  7. The inferior vena cava is normal in size with greater than 50%  respiratory variability, suggesting right atrial pressure of 3 mmHg.    1. LV function low normal to mildly reduced.  2. Left ventricular ejection fraction, by estimation, is 50 to 55%. The  left ventricle has low normal function. The left ventricle has no regional  wall motion abnormalities. There is moderate left ventricular  hypertrophy.  Left ventricular diastolic  parameters are consistent with Grade II diastolic dysfunction  (pseudonormalization). Elevated left atrial pressure.  3. Right ventricular systolic function is normal. The right ventricular  size is normal.  4. Left atrial size was mildly dilated.  5. The mitral valve is normal in structure. No evidence of mitral valve  regurgitation. No evidence of mitral stenosis.  6. The aortic valve is tricuspid. Aortic valve regurgitation is not  visualized. No aortic stenosis is present.  7. The inferior vena cava is normal in size with greater than 50%  respiratory variability, suggesting right atrial pressure of 3 mmHg.    Neuro/Psych PSYCHIATRIC DISORDERS Depression  Neuromuscular disease CVA    GI/Hepatic Neg liver ROS, GERD  ,  Endo/Other  diabetes, Insulin DependentMorbid obesity  Renal/GU ESRF and DialysisRenal disease     Musculoskeletal   Abdominal   Peds  Hematology  (+) Blood dyscrasia, anemia , Lab Results      Component                Value               Date                      WBC                      7.0                 12/14/2021                HGB  10.2 (L)            12/22/2021                HCT                      30.0 (L)            12/22/2021                MCV                      97.0                12/14/2021                PLT                      254                 12/14/2021              Anesthesia Other Findings   Reproductive/Obstetrics                            Anesthesia Physical Anesthesia Plan  ASA: 4  Anesthesia Plan: MAC, Regional and General   Post-op Pain Management: Regional block   Induction: Intravenous, Rapid sequence and Cricoid pressure planned  PONV Risk Score and Plan: 2 and Treatment may vary due to age or medical condition and Propofol infusion  Airway Management Planned: Oral ETT  Additional Equipment: None  Intra-op Plan:    Post-operative Plan: Extubation in OR  Informed Consent: I have reviewed the patients History and Physical, chart, labs and discussed the procedure including the risks, benefits and alternatives for the proposed anesthesia with the patient or authorized representative who has indicated his/her understanding and acceptance.     Dental advisory given  Plan Discussed with: CRNA and Anesthesiologist  Anesthesia Plan Comments: (PAT note written 12/20/2021 by Myra Gianotti, PA-C. )      Anesthesia Quick Evaluation

## 2021-12-20 NOTE — Progress Notes (Signed)
Paramedicine Encounter    Patient ID: Nancy [name], female    DOB: 24-Sep-1961, 61 y.o.   MRN: [id number]  Met with Nancy [name] in the home today for paramedicine visit. She is alert and oriented seated in her chair. She reports today feeling much better than last week when she was seen in the ER. She stated her breathing feels better. I assessed her with lungs being clear today, swelling in lower legs bilaterally still present.   Ortho boot on left foot needs replacing I will reach out to PCP about same.   Vitals obtained weight is down 6lbs this week. She is currently on Torsemide 80mg  BID. I reached out to HF clinic about Metolazone weekly as previously mentioned by Allena Katz NP but no reply. We will continue Torsemide 80mg  BID until otherwise instructed. I reviewed chart and medications. Pill box filled for one week and instructions for admission procedure followed. I wrote pre-admission instructions down for Nancy [name] as requested.   We reviewed appointments and confirmed same. Home visit complete. I will see Nancy [name] in one week in the home.   Refills: Risperidone    Patient Care Team: [name], MD as PCP - General (Internal Medicine) [name], MD as PCP - Cardiology (Cardiology) Larey Dresser, MD as PCP - Advanced Heart Failure (Cardiology)  Patient Active Problem List   Diagnosis Date Noted   Critical limb ischemia of both lower extremities ([place]) 11/22/2021   Osteomyelitis ([place])    Peripheral artery disease ([place]) 10/27/2021   Acute exacerbation of congestive heart failure ([place]) 10/24/2021   Acute cardiogenic pulmonary edema ([place]) 10/06/2021   Coronary artery disease involving native coronary artery of native heart without angina pectoris 10/06/2021   Goals of care, counseling/discussion    Gangrene of left foot ([place])    Cellulitis of left foot    Diabetic ulcer of left foot ([place]) 09/04/2021   CHF (congestive heart failure) ([place])  07/07/2021   Normocytic anemia 06/30/2021   CKD (chronic kidney disease), stage IV ([place]) 06/11/2021   Acute on chronic diastolic CHF (congestive heart failure) ([place]) 06/10/2021   Abnormal nuclear stress test    Dyspnea on exertion 03/07/2021   Orthopnea 02/25/2021   History of cerebrovascular accident (CVA) with residual deficit 05/06/2020   Diabetic peripheral neuropathy ([place]) 04/26/2020   AKI (acute kidney injury) ([place]) 04/20/2020   Generalized weakness 04/20/2020   Dehydration 04/20/2020   Generalized abdominal pain    Pancreatitis, acute 02/29/2020   Cerebrovascular accident (CVA) ([place]) 11/08/2019   Stenosis of right carotid artery 11/08/2019   Mixed diabetic hyperlipidemia associated with type 2 diabetes mellitus ([place]) 11/08/2019   Cortical age-related cataract of both eyes 08/30/2019   Gait abnormality 08/30/2019   Statin declined 08/30/2019   Gastroesophageal reflux disease without esophagitis 04/18/2018   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   Type 2 diabetes mellitus with stage 4 chronic kidney disease, with long-term current use of insulin ([place]) 10/29/2017   History of macular degeneration 10/29/2017   Chronic cervical pain 10/29/2016   Myalgia 09/14/2016   Insomnia 09/14/2016   Tinea pedis of both feet 08/20/2016   Macular degeneration 08/17/2016   Low back pain 09/21/2014   Hypertensive urgency 09/21/2014   Diabetic gastroparesis associated with type 2 diabetes mellitus ([place]) 09/14/2014   Uncontrolled hypertension 09/08/2014   Thyroid nodule 08/17/2014   Obesity (BMI 30-39.9) 08/17/2014   Nausea & vomiting 08/04/2014   Type II diabetes mellitus with neurological manifestations, uncontrolled 08/04/2014  Current Outpatient Medications:    acetaminophen (TYLENOL) 500 MG tablet, Take 500 mg by mouth every 6 (six) hours as needed for moderate pain or headache., Disp: , Rfl:    amLODipine (NORVASC) 10 MG tablet, Take 1 tablet (10 mg total) by mouth daily., Disp: 30 tablet, Rfl:  6   aspirin 81 MG EC tablet, Take 1 tablet (81 mg total) by mouth daily., Disp: 60 tablet, Rfl: 1   atorvastatin (LIPITOR) 40 MG tablet, Take 1 tablet (40 mg total) by mouth daily., Disp: 30 tablet, Rfl: 6   Blood Glucose Monitoring Suppl (ONETOUCH VERIO) w/Device KIT, Check blood sugar three times daily. E11.40, Disp: 1 kit, Rfl: 0   calcium acetate (PHOSLO) 667 MG capsule, Take 1 capsule (667 mg total) by mouth 3 (three) times daily with meals., Disp: 90 capsule, Rfl: 3   carvedilol (COREG) 12.5 MG tablet, Take 1.5 tablets (18.75 mg total) by mouth 2 (two) times daily with a meal., Disp: 90 tablet, Rfl: 6   Continuous Blood Gluc Receiver (DEXCOM G6 RECEIVER) DEVI, 1 Device by Does not apply route daily., Disp: 1 each, Rfl: 0   Continuous Blood Gluc Sensor (DEXCOM G6 SENSOR) MISC, 1 packet by Does not apply route daily., Disp: 3 each, Rfl: 11   Continuous Blood Gluc Transmit (DEXCOM G6 TRANSMITTER) MISC, 1 packet by Does not apply route daily., Disp: 1 each, Rfl: 4   ferrous sulfate 325 (65 FE) MG tablet, Take 1 tablet (325 mg total) by mouth daily with breakfast., Disp: 100 tablet, Rfl: 1   glucose blood test strip, Use as instructedCheck blood sugar three times daily. E11.40, Disp: 100 each, Rfl: 12   insulin glargine (LANTUS) 100 UNIT/ML Solostar Pen, Inject 30 Units into the skin at bedtime., Disp: 15 mL, Rfl: 3   insulin lispro (HUMALOG KWIKPEN) 100 UNIT/ML KwikPen, Inject 4 units before breakfast and dinner., Disp: 15 mL, Rfl: 3   Insulin Pen Needle 32G X 4 MM MISC, Use to inject insulin as directed., Disp: 100 each, Rfl: 4   metolazone (ZAROXOLYN) 2.5 MG tablet, Take 1 tablet (2.5 mg total) by mouth once for 1 dose., Disp: 1 tablet, Rfl: 0   potassium chloride SA (KLOR-CON M) 20 MEQ tablet, Take 1 tablet (20 mEq total) by mouth once for 1 dose., Disp: 10 tablet, Rfl: 0   risperiDONE (RISPERDAL) 3 MG tablet, TAKE 1 TABLET (3 MG TOTAL) BY MOUTH AT BEDTIME., Disp: 30 tablet, Rfl: 2   torsemide  (DEMADEX) 20 MG tablet, Take 4 tablets (80 mg total) by mouth 2 (two) times daily., Disp: 150 tablet, Rfl: 6 Allergies  Allergen Reactions   Hydralazine Hcl Other (See Comments)    Hair loss   Hydrocodone Itching and Other (See Comments)    Upset stomach   Metformin And Related Nausea And Vomiting and Other (See Comments)    Stomach pains, also   Other Nausea Only and Other (See Comments)    Lettuce- Does not digest this!!   Plaquenil [Hydroxychloroquine Sulfate] Hives   Shellfish-Derived Products Nausea Only and Other (See Comments)    Caused an upset stomach   Shrimp (Diagnostic) Nausea Only and Other (See Comments)    Upset stomach    Sulfa Antibiotics Hives     Social History   Socioeconomic History   Marital status: Legally Separated    Spouse name: [name]   Number of children: 1   Years of education: some colle   Highest education level: Not on file  Occupational  History   Occupation: Employed FT as Landscape architect    Comment: [employer] , NA  Tobacco Use   Smoking status: Former    Packs/day: 0.25    Years: 0.50    Pack years: 0.13    Types: Cigarettes   Smokeless tobacco: Never   Tobacco comments:    Former smoker 11/03/21  Vaping Use   Vaping Use: Never used  Substance and Sexual Activity   Alcohol use: No   Drug use: No   Sexual activity: Yes    Birth control/protection: None  Other Topics Concern   Not on file  Social History Narrative   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    Social Determinants of Health   Financial Resource Strain: Low Risk    Difficulty of Paying Living Expenses: Not very hard  Food Insecurity: No Food Insecurity   Worried About Charity fundraiser in the Last Year: Never true   Ran Out of Food in the Last Year: Never true  Transportation Needs: No Transportation Needs   Lack of Transportation (Medical): No   Lack of Transportation (Non-Medical): No  Physical Activity: Not on file  Stress:  Not on file  Social Connections: Not on file  Intimate Partner Violence: Not on file    Physical Exam Vitals reviewed.  Constitutional:      General: She is not in acute distress.    Appearance: Normal appearance. She is not ill-appearing.  HENT:     Head: Normocephalic.     Nose: Nose normal.     Mouth/Throat:     Mouth: Mucous membranes are moist.     Pharynx: Oropharynx is clear.  Eyes:     Conjunctiva/sclera: Conjunctivae normal.     Pupils: Pupils are equal, round, and reactive to light.  Cardiovascular:     Rate and Rhythm: Normal rate and regular rhythm.     Pulses: Normal pulses.     Heart sounds: Normal heart sounds.  Pulmonary:     Effort: Pulmonary effort is normal. No respiratory distress.     Breath sounds: Normal breath sounds. No wheezing, rhonchi or rales.  Musculoskeletal:        General: Swelling present. Normal range of motion.     Cervical back: Normal range of motion.     Right lower leg: Edema present.     Left lower leg: Edema present.  Skin:    General: Skin is warm and dry.     Capillary Refill: Capillary refill takes less than 2 seconds.  Neurological:     General: No focal deficit present.     Mental Status: She is alert. Mental status is at baseline.  Psychiatric:        Mood and Affect: Mood normal.        Future Appointments  Date Time Provider [place]  12/29/2021  3:30 PM Laurice Record, MD [facility]  01/05/2022 10:00 AM [facility] VASC 3 [facility]  01/05/2022 12:00 PM Larey Dresser, MD [facility] None  05/18/2022 11:15 AM [name], MD [facility]     ACTION: Home visit completed

## 2021-12-20 NOTE — Progress Notes (Signed)
PCP - [name], MD Cardiologist - Quay Burow, MD  PPM/ICD - Denies  Chest x-ray - 12/06/21 EKG - 12/15/21 Stress Test - "Long time ago" ECHO - 10/06/21 Cardiac Cath - 07/14/21  CPAP - Denies  Fasting Blood Sugar - 145 Checks Blood Sugar 3/day  Aspirin Instructions: Take DOS  ERAS Protcol - NPO  COVID TEST- N/A Ambulatory sx  Anesthesia review: Y  Patient verbally denies any shortness of breath, fever, cough and chest pain during phone call   -------------  SDW INSTRUCTIONS given:  Your procedure is scheduled on 12/22/20.  Report to [facility] Main Entrance "A" at 1045 A.M., and check in at the Admitting office.  Call this number if you have problems the morning of surgery:  [phone]   Remember:  Do not eat after midnight the night before your surgery    Take these medicines the morning of surgery with A SIP OF WATER: Coreg Amlodipine Lipitor Aspirin  Lantus: Take 15 units at bedtime  Humalog: Dinner take 4 units.  Do not take any the morning of surgery   **If blood sugar is above 220**  Please take 2 units of Humalog   .** PLEASE check your blood sugar the morning of your surgery when you wake up and every 2 hours until you get to the Short Stay unit.  If your blood sugar is less than 70 mg/dL, you will need to treat for low blood sugar: Do not take insulin. Treat a low blood sugar (less than 70 mg/dL) with  cup of clear juice (cranberry or apple), 4 glucose tablets, OR glucose gel. Recheck blood sugar in 15 minutes after treatment (to make sure it is greater than 70 mg/dL). If your blood sugar is not greater than 70 mg/dL on recheck, call [phone] for further instructions.  Patient and significant other both verbalized understanding of diabetes instructions  As of today, STOP taking any Aspirin (unless otherwise instructed by your surgeon) Aleve, Naproxen, Ibuprofen, Motrin, Advil, Goody's, BC's, all herbal medications, fish oil, and  all vitamins.                      Do not wear jewelry, make up, or nail polish            Do not wear lotions, powders, perfumes/colognes, or deodorant.            Do not shave 48 hours prior to surgery.  Men may shave face and neck.            Do not bring valuables to the hospital.            [facility] is not responsible for any belongings or valuables.  Do NOT Smoke (Tobacco/Vaping) or drink Alcohol 24 hours prior to your procedure If you use a CPAP at night, you may bring all equipment for your overnight stay.   Contacts, glasses, dentures or bridgework may not be worn into surgery.      For patients admitted to the hospital, discharge time will be determined by your treatment team.   Patients discharged the day of surgery will not be allowed to drive home, and someone needs to stay with them for 24 hours.    Special instructions:   [facility]- Preparing For Surgery  Before surgery, you can play an important role. Because skin is not sterile, your skin needs to be as free of germs as possible. You can reduce the number of germs on your  skin by washing with CHG (chlorahexidine gluconate) Soap before surgery.  CHG is an antiseptic cleaner which kills germs and bonds with the skin to continue killing germs even after washing.    Oral Hygiene is also important to reduce your risk of infection.  Remember - BRUSH YOUR TEETH THE MORNING OF SURGERY WITH YOUR REGULAR TOOTHPASTE  Please do not use if you have an allergy to CHG or antibacterial soaps. If your skin becomes reddened/irritated stop using the CHG.  Do not shave (including legs and underarms) for at least 48 hours prior to first CHG shower. It is OK to shave your face.  Please follow these instructions carefully.   Shower the NIGHT BEFORE SURGERY and the MORNING OF SURGERY with DIAL Soap.   Pat yourself dry with a CLEAN TOWEL.  Wear CLEAN PAJAMAS to bed the night before surgery  Place CLEAN SHEETS on your bed the night  of your first shower and DO NOT SLEEP WITH PETS.   Day of Surgery: Please shower morning of surgery  Wear Clean/Comfortable clothing the morning of surgery Do not apply any deodorants/lotions.   Remember to brush your teeth WITH YOUR REGULAR TOOTHPASTE.   Questions were answered. Patient verbalized understanding of instructions.

## 2021-12-20 NOTE — Progress Notes (Addendum)
Anesthesia Chart Review: Kathleene Hazel  Case: [id number] Date/Time: 12/22/21 1302   Procedure: RIGHT ARM ARTERIOVENOUS (AV) FISTULA CREATION VERSUS GRAFT PLACEMENT (Right)   Anesthesia type: Choice   Pre-op diagnosis: CKD   Location: [facility] OR ROOM 12 / [facility]   Surgeons: Waynetta Sandy, MD       DISCUSSION: Patient is a 61 year old female scheduled for the above procedure.  History includes former smoker, DM (with peripheral neuropathy, nephropathy), CKD (stage IV-V), CAD (non-obstructive 04/2021), chronic diastolic CHF (in setting of CKD and likely due to long-standing and poorly controlled HTN), murmur (no valvular stenosis or regurgitation 10/06/21 echo), dyspnea, carotid artery disease (right ICA occlusion, 40-59% left ICA 02/2021), anemia, HLD, IBS, gastroparesis, CVA (2020), thyroid nodule (2004), hard of hearing. TAH (10/22/02), cholecystectomy (03/05/20 for gallstone pancreatitis), obesity.    Multiple admissions in the past ~ 6 months for CHF exacerbations and/or LLE wound/osteomyelitis including in May, June, July, September, and November. Last [place] admission 10/24/21-11/01/21 for left foot osteomyelitis, acute on chronic diastolic CHF in setting of CKD stage IV and HTN.  Orthopedic surgeon recommended left transtibial amputation; however, patient not ready to consent at that point.  ID assisted with antibiotic recommendations but emphasized this alone was not curative.  Volume overload treated with diuretics.  Overall renal function stable and not felt to represent urgent need for hemodialysis.  Previously she had been resistant to the idea of dialysis. Out-patient follow-up planned including at the [facility].   Last HF cardiology visit 11/03/21. Activity limited by LLE wounds. Diuretic therapy adjusted for volume excess. Not a candidate currently for Cardiomems. Would need AVF placed soon, and felt nearing need for hemodialysis. She was also being followed by Paramedicine EMT.  APP follow-up in ~ 3 weeks recommended and with Dr. Aundra Dubin in 2 months. Last primary cardiology visit with Dr. Gwenlyn Found was on 11/22/21. Still considering whether to proceed with angiography to assess for LLE PAD, but concern this could push her into ESRD.   ED visit 12/14/21 for edema and dyspnea. Given IV Lasix 80 mg and 2.5 metolazone by paramedicine with increased in home diuretics by HF provider the day prior. She was given additional IV Lasix in the ED and felt she would not require admission. Appears she has paramedicine follow-up scheduled for 12/20/21 (not note available in [facility] yet).    Patient with multiple co-morbidities as discussed. Multiple diastolic CHF exacerbations in setting of worsening renal function. She is followed by multiple providers including nephrology, cardiology, HF cardiology, and wound care. She is not yet on hemodialysis, although notes suggests she is nearing need. She is a same day work-up, so anesthesia team to evaluate on the day of surgery.  .   VS: Ht $Remo'5\' 3"'CUuoi$  (1.6 m)    Wt 109.8 kg    BMI 42.87 kg/m  BP Readings from Last 3 Encounters:  12/14/21 (!) 177/81  12/14/21 120/68  12/13/21 140/68   Pulse Readings from Last 3 Encounters:  12/14/21 78  12/14/21 70  12/13/21 72     PROVIDERS: [name], MD is PCP  Quay Burow, MD is primary cardiologist. Last visit 11/22/21.  Loralie Champagne, MD is HF cardiologist. Last visit 11/03/21 with Allena Katz, [place].  Madelon Lips, MD is nephrologist. Last visit seen with Maebelle Munroe, MD is Wound care provider. Last visit 11/30/21 for left foot wound. She declined amputation which had been recommended by Ortho. He spoke with Dr. Gwenlyn Found and  discussed consideration of angiography to assess degree of PAD; however, proceeding would pose some risk to developing ESRD.  Meridee Score, MD is orthopedic surgeon Laurice Record, MD is ID. 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 She was not ready to consent to amputation. Antibiotic coverage discussed but emphasized antibiotics alone would not be curative.      LABS: For ISTAT on arrival as indicated. Last as of 12/14/21 show Creatinine 4.01, BNP 409, H/H 8.8/28.9 (up fro m8.1/26.3 on 11/13/21). A1c 6.8% 09/04/21.     IMAGES: CXR 12/14/21: FINDINGS: Cardiac and mediastinal contours are unchanged. Bilateral interstitial opacities with mild areas of consolidation. Small right pleural effusion. No evidence of pneumothorax. IMPRESSION: 1. Bilateral interstitial opacities with mild areas of consolidation, likely due to pulmonary edema. 2. Small right pleural effusion.    EKG: 12/14/21: Normal sinus rhythm Cannot rule out Anterior infarct , age undetermined Abnormal ECG similar to Nov 2022 Confirmed by Sherwood Gambler [phone]) on 12/14/2021 8:31:56 PM   CV: Echo 10/06/21: IMPRESSIONS   1. LV function low normal to mildly reduced.   2. Left ventricular ejection fraction, by estimation, is 50 to 55%. The  left ventricle has low normal function. The left ventricle has no regional  wall motion abnormalities. There is moderate left ventricular hypertrophy.  Left ventricular diastolic  parameters are consistent with Grade II diastolic dysfunction  (pseudonormalization). Elevated left atrial pressure.   3. Right ventricular systolic function is normal. The right ventricular  size is normal.   4. Left atrial size was mildly dilated.   5. The mitral valve is normal in structure. No evidence of mitral valve  regurgitation. No evidence of mitral stenosis.   6. The aortic valve is tricuspid. Aortic valve regurgitation is not  visualized. No aortic stenosis is present.   7. The inferior vena cava is normal in size with greater than 50%  respiratory variability, suggesting right atrial pressure of 3 mmHg.    LE Arterial Duplex/ABIs 09/05/21: Summary:  - Right: Resting right  ankle-brachial index indicates moderate right lower  extremity arterial disease.  Unable to obtain TBI due to constant patient movement.  - Left: Resting left ankle-brachial index indicates moderate left lower  extremity arterial disease.  Unable to obtain TBI due to constant patient movement.      [place] 07/14/21: 1. Elevated right and left heart filling pressures.  2. Prominent v-wave in PCWP tracing.  3. Primarily pulmonary venous hypertension.  4. Preserved cardiac output.    NM Cardiac Amyloid Scan 07/12/21: IMPRESSION: Visual and quantitative assessment (grade 1, H/CLL equal 1.0) are equivocal suggestive of transthyretin amyloidosis.  - Per Dr. Aundra Dubin, "PYP scan not suggestive of TTR cardiac amyloidosis." 07/11/21 urine immunofixation unremarkable;  07/11/21 myeloma panel showed elevated IgA of 469 and polyclonal increase detected in one or more immunoglobulins with protein electrophoresis scan to follow.    RHC/LHC 04/27/21 Gwenlyn Found, Roderic Palau, MD): Ost LAD to Prox LAD lesion is 40% stenosed. Mid LAD lesion is 50% stenosed. Mid LAD to Dist LAD lesion is 40% stenosed. Hemodynamic findings consistent with pulmonary hypertension. IMPRESSION: Ms. Newburn has pulmonary hypertension with high LVEDP and right atrial pressure as well as wedge.  She has mild to moderate segmental proximal mid and distal LAD disease with out a focal culprit lesion.  Is hard to tell whether her Myoview was truly positive or real represented breast attenuation artifact but in any event there is nothing that requires revascularization.  Her circumflex and  right coronary artery were free of significant disease.  She will need medical therapy for her pulmonary hypertension including diuresis and vasodilators.Marland KitchenMarland KitchenI will arrange for her to see one of the advanced heart failure MDs and the heart failure clinic.      Nuclear stress test 04/21/21: Eugenie Birks stress is electricallly negative for ischemia Myoview scan shows smal  basal anterior defect, base/mid anterolateral defect and basal inferior defect that partially improves consistent with scar and ischemia,; cannot exclude coexistent soft tissue attenuation (breast, shifting breast) LVEF calculated at 46% with lateral hypokinesis OVerall intermediate risk scan Note: patient scheduled for L heart catheterization to define anatomy   US Carotid 03/10/21: Summary:  Right Carotid: Evidence consistent with a total occlusion of the right  ICA.                 Known mid and distal ICA occlusion.  Left Carotid: Velocities in the left ICA are consistent with a 40-59%  stenosis.                Essentially stable LICA velocities. Minimum plaque noted;  elevated                velocities throughout possibly due to contralateral  occlusion.  Vertebrals:  Bilateral vertebral arteries demonstrate antegrade flow.  Subclavians: Normal flow hemodynamics were seen in bilateral subclavian               arteries.   Past Medical History:  Diagnosis Date   Anemia 2006   Carotid artery disease (HCC)    right ICA occlusion, 40-59% LICA 02/2021 [place]   CHF (congestive heart failure) (HCC)    Chronic kidney disease    Coronary artery disease    Depression 2014   previously on amitryptiline    Diabetes mellitus with neurological manifestation (HCC) 2006   Diabetic peripheral neuropathy (HCC) 04/26/2020   Dyspnea    Fracture of left ankle 1997   Gastroparesis 07/2016   Heart murmur    HOH (hard of hearing) 2004   Hyperlipidemia 2006   Hypertension 2006   IBS (irritable bowel syndrome) 2002   Leukopenia 2015   Macular degeneration 11/2019   Shingles 2009   Stroke [facility]) 2020   Thyroid nodule 2004    Past Surgical History:  Procedure Laterality Date   ABDOMINAL HYSTERECTOMY  2005   CATARACT EXTRACTION Left 11/2019   CESAREAN SECTION  1983    CHOLECYSTECTOMY N/A 03/05/2020   Procedure: LAPAROSCOPIC CHOLECYSTECTOMY WITH INTRAOPERATIVE CHOLANGIOGRAM;  Surgeon: Manus Rudd, MD;  Location: [facility] ORS;  Service: General;  Laterality: N/A;   ESOPHAGOGASTRODUODENOSCOPY (EGD) WITH PROPOFOL Left 08/26/2014   Procedure: ESOPHAGOGASTRODUODENOSCOPY (EGD) WITH PROPOFOL;  Surgeon: Willis Modena, MD;  Location: [facility] ENDOSCOPY;  Service: Endoscopy;  Laterality: Left;   ESOPHAGOGASTRODUODENOSCOPY (EGD) WITH PROPOFOL N/A 03/03/2020   Procedure: ESOPHAGOGASTRODUODENOSCOPY (EGD) WITH PROPOFOL;  Surgeon: Beverley Fiedler, MD;  Location: [facility] ENDOSCOPY;  Service: Gastroenterology;  Laterality: N/A;   RIGHT HEART CATH N/A 07/14/2021   Procedure: RIGHT HEART CATH;  Surgeon: Laurey Morale, MD;  Location: [facility] INVASIVE CV LAB;  Service: Cardiovascular;  Laterality: N/A;   RIGHT/LEFT HEART CATH AND CORONARY ANGIOGRAPHY N/A 04/27/2021   Procedure: RIGHT/LEFT HEART CATH AND CORONARY ANGIOGRAPHY;  Surgeon: Runell Gess, MD;  Location: [facility] INVASIVE CV LAB;  Service: Cardiovascular;  Laterality: N/A;    MEDICATIONS: No current facility-administered medications for this encounter.    acetaminophen (TYLENOL) 500 MG tablet   amLODipine (NORVASC) 10 MG tablet  aspirin 81 MG EC tablet   atorvastatin (LIPITOR) 40 MG tablet   calcium acetate (PHOSLO) 667 MG capsule   carvedilol (COREG) 12.5 MG tablet   ferrous sulfate 325 (65 FE) MG tablet   insulin glargine (LANTUS) 100 UNIT/ML Solostar Pen   insulin lispro (HUMALOG KWIKPEN) 100 UNIT/ML KwikPen   risperiDONE (RISPERDAL) 3 MG tablet   torsemide (DEMADEX) 20 MG tablet   Blood Glucose Monitoring Suppl (ONETOUCH VERIO) w/Device KIT   Continuous Blood Gluc Receiver (DEXCOM G6 RECEIVER) DEVI   Continuous Blood Gluc Sensor (DEXCOM G6 SENSOR) MISC   Continuous Blood Gluc Transmit (DEXCOM G6 TRANSMITTER) MISC   glucose blood test strip   Insulin Pen Needle 32G X 4 MM MISC   metolazone (ZAROXOLYN) 2.5 MG tablet   potassium chloride SA (KLOR-CON M) 20 MEQ tablet    Myra Gianotti, PA-C Surgical Short Stay/Anesthesiology [facility] Phone [phone] [facility] Phone [phone] 12/20/2021 1:43 PM

## 2021-12-22 ENCOUNTER — Other Ambulatory Visit: Payer: Self-pay

## 2021-12-22 ENCOUNTER — Ambulatory Visit (HOSPITAL_COMMUNITY): Payer: Medicaid Other | Admitting: Vascular Surgery

## 2021-12-22 ENCOUNTER — Encounter (HOSPITAL_COMMUNITY): Payer: Self-pay

## 2021-12-22 ENCOUNTER — Inpatient Hospital Stay (HOSPITAL_COMMUNITY): Payer: Medicaid Other

## 2021-12-22 ENCOUNTER — Inpatient Hospital Stay (HOSPITAL_COMMUNITY)
Admission: RE | Admit: 2021-12-22 | Discharge: 2021-12-24 | DRG: 264 | Disposition: A | Discharge status: Home / Self Care | Payer: Medicaid Other | Attending: Vascular Surgery | Admitting: Vascular Surgery

## 2021-12-22 ENCOUNTER — Encounter (HOSPITAL_COMMUNITY)
Admission: RE | Disposition: A | Discharge status: Home / Self Care | Payer: Self-pay | Source: Home / Self Care | Attending: Vascular Surgery

## 2021-12-22 ENCOUNTER — Ambulatory Visit (HOSPITAL_COMMUNITY): Payer: Medicaid Other

## 2021-12-22 ENCOUNTER — Inpatient Hospital Stay: Payer: Medicaid Other | Admitting: Internal Medicine

## 2021-12-22 ENCOUNTER — Encounter (HOSPITAL_COMMUNITY): Payer: Self-pay | Admitting: Vascular Surgery

## 2021-12-22 DIAGNOSIS — E785 Hyperlipidemia, unspecified: Secondary | ICD-10-CM | POA: Diagnosis present

## 2021-12-22 DIAGNOSIS — Z90710 Acquired absence of both cervix and uterus: Secondary | ICD-10-CM

## 2021-12-22 DIAGNOSIS — Z79899 Other long term (current) drug therapy: Secondary | ICD-10-CM | POA: Diagnosis not present

## 2021-12-22 DIAGNOSIS — E1143 Type 2 diabetes mellitus with diabetic autonomic (poly)neuropathy: Secondary | ICD-10-CM | POA: Diagnosis present

## 2021-12-22 DIAGNOSIS — I272 Pulmonary hypertension, unspecified: Secondary | ICD-10-CM | POA: Diagnosis present

## 2021-12-22 DIAGNOSIS — I132 Hypertensive heart and chronic kidney disease with heart failure and with stage 5 chronic kidney disease, or end stage renal disease: Principal | ICD-10-CM | POA: Diagnosis present

## 2021-12-22 DIAGNOSIS — J9601 Acute respiratory failure with hypoxia: Secondary | ICD-10-CM | POA: Diagnosis present

## 2021-12-22 DIAGNOSIS — Z803 Family history of malignant neoplasm of breast: Secondary | ICD-10-CM

## 2021-12-22 DIAGNOSIS — Z794 Long term (current) use of insulin: Secondary | ICD-10-CM

## 2021-12-22 DIAGNOSIS — Z800 Family history of malignant neoplasm of digestive organs: Secondary | ICD-10-CM | POA: Diagnosis not present

## 2021-12-22 DIAGNOSIS — I2510 Atherosclerotic heart disease of native coronary artery without angina pectoris: Secondary | ICD-10-CM | POA: Diagnosis present

## 2021-12-22 DIAGNOSIS — E669 Obesity, unspecified: Secondary | ICD-10-CM | POA: Diagnosis present

## 2021-12-22 DIAGNOSIS — I6521 Occlusion and stenosis of right carotid artery: Secondary | ICD-10-CM | POA: Diagnosis present

## 2021-12-22 DIAGNOSIS — Z833 Family history of diabetes mellitus: Secondary | ICD-10-CM | POA: Diagnosis not present

## 2021-12-22 DIAGNOSIS — I771 Stricture of artery: Secondary | ICD-10-CM | POA: Diagnosis present

## 2021-12-22 DIAGNOSIS — I5033 Acute on chronic diastolic (congestive) heart failure: Secondary | ICD-10-CM | POA: Diagnosis present

## 2021-12-22 DIAGNOSIS — E1149 Type 2 diabetes mellitus with other diabetic neurological complication: Secondary | ICD-10-CM | POA: Diagnosis present

## 2021-12-22 DIAGNOSIS — M869 Osteomyelitis, unspecified: Secondary | ICD-10-CM | POA: Diagnosis present

## 2021-12-22 DIAGNOSIS — E1169 Type 2 diabetes mellitus with other specified complication: Secondary | ICD-10-CM | POA: Diagnosis present

## 2021-12-22 DIAGNOSIS — N186 End stage renal disease: Secondary | ICD-10-CM | POA: Diagnosis present

## 2021-12-22 DIAGNOSIS — D509 Iron deficiency anemia, unspecified: Secondary | ICD-10-CM | POA: Diagnosis present

## 2021-12-22 DIAGNOSIS — Z992 Dependence on renal dialysis: Secondary | ICD-10-CM

## 2021-12-22 DIAGNOSIS — E1142 Type 2 diabetes mellitus with diabetic polyneuropathy: Secondary | ICD-10-CM | POA: Diagnosis present

## 2021-12-22 DIAGNOSIS — Z9049 Acquired absence of other specified parts of digestive tract: Secondary | ICD-10-CM

## 2021-12-22 DIAGNOSIS — Z8249 Family history of ischemic heart disease and other diseases of the circulatory system: Secondary | ICD-10-CM

## 2021-12-22 DIAGNOSIS — Z9889 Other specified postprocedural states: Secondary | ICD-10-CM | POA: Diagnosis present

## 2021-12-22 DIAGNOSIS — Z83438 Family history of other disorder of lipoprotein metabolism and other lipidemia: Secondary | ICD-10-CM

## 2021-12-22 DIAGNOSIS — E1122 Type 2 diabetes mellitus with diabetic chronic kidney disease: Secondary | ICD-10-CM | POA: Diagnosis present

## 2021-12-22 DIAGNOSIS — Z01818 Encounter for other preprocedural examination: Secondary | ICD-10-CM

## 2021-12-22 DIAGNOSIS — R0603 Acute respiratory distress: Secondary | ICD-10-CM | POA: Diagnosis present

## 2021-12-22 DIAGNOSIS — H3530 Unspecified macular degeneration: Secondary | ICD-10-CM | POA: Diagnosis present

## 2021-12-22 DIAGNOSIS — N189 Chronic kidney disease, unspecified: Secondary | ICD-10-CM

## 2021-12-22 DIAGNOSIS — R0902 Hypoxemia: Secondary | ICD-10-CM

## 2021-12-22 DIAGNOSIS — Z8349 Family history of other endocrine, nutritional and metabolic diseases: Secondary | ICD-10-CM

## 2021-12-22 DIAGNOSIS — Z87891 Personal history of nicotine dependence: Secondary | ICD-10-CM

## 2021-12-22 DIAGNOSIS — Z8673 Personal history of transient ischemic attack (TIA), and cerebral infarction without residual deficits: Secondary | ICD-10-CM

## 2021-12-22 HISTORY — DX: Disorder of arteries and arterioles, unspecified: I77.9

## 2021-12-22 HISTORY — DX: Cardiac murmur, unspecified: R01.1

## 2021-12-22 HISTORY — DX: Dyspnea, unspecified: R06.00

## 2021-12-22 HISTORY — DX: Heart failure, unspecified: I50.9

## 2021-12-22 HISTORY — DX: Atherosclerotic heart disease of native coronary artery without angina pectoris: I25.10

## 2021-12-22 HISTORY — PX: AV FISTULA PLACEMENT: SHX1204

## 2021-12-22 HISTORY — DX: Chronic kidney disease, unspecified: N18.9

## 2021-12-22 LAB — POCT I-STAT 7, (LYTES, BLD GAS, ICA,H+H)
Acid-Base Excess: 2 mmol/L (ref 0.0–2.0)
Bicarbonate: 28.1 mmol/L — ABNORMAL HIGH (ref 20.0–28.0)
Calcium, Ion: 1.16 mmol/L (ref 1.15–1.40)
HCT: 26 % — ABNORMAL LOW (ref 36.0–46.0)
Hemoglobin: 8.8 g/dL — ABNORMAL LOW (ref 12.0–15.0)
O2 Saturation: 89 %
Patient temperature: 36.6
Potassium: 4.4 mmol/L (ref 3.5–5.1)
Sodium: 136 mmol/L (ref 135–145)
TCO2: 30 mmol/L (ref 22–32)
pCO2 arterial: 50.6 mmHg — ABNORMAL HIGH (ref 32.0–48.0)
pH, Arterial: 7.351 (ref 7.350–7.450)
pO2, Arterial: 60 mmHg — ABNORMAL LOW (ref 83.0–108.0)

## 2021-12-22 LAB — POCT I-STAT, CHEM 8
BUN: 49 mg/dL — ABNORMAL HIGH (ref 8–23)
Calcium, Ion: 0.97 mmol/L — ABNORMAL LOW (ref 1.15–1.40)
Chloride: 100 mmol/L (ref 98–111)
Creatinine, Ser: 4.2 mg/dL — ABNORMAL HIGH (ref 0.44–1.00)
Glucose, Bld: 87 mg/dL (ref 70–99)
HCT: 30 % — ABNORMAL LOW (ref 36.0–46.0)
Hemoglobin: 10.2 g/dL — ABNORMAL LOW (ref 12.0–15.0)
Potassium: 3.9 mmol/L (ref 3.5–5.1)
Sodium: 135 mmol/L (ref 135–145)
TCO2: 27 mmol/L (ref 22–32)

## 2021-12-22 LAB — PHOSPHORUS: Phosphorus: 5.9 mg/dL — ABNORMAL HIGH (ref 2.5–4.6)

## 2021-12-22 LAB — GLUCOSE, CAPILLARY
Glucose-Capillary: 104 mg/dL — ABNORMAL HIGH (ref 70–99)
Glucose-Capillary: 113 mg/dL — ABNORMAL HIGH (ref 70–99)
Glucose-Capillary: 114 mg/dL — ABNORMAL HIGH (ref 70–99)
Glucose-Capillary: 139 mg/dL — ABNORMAL HIGH (ref 70–99)
Glucose-Capillary: 170 mg/dL — ABNORMAL HIGH (ref 70–99)

## 2021-12-22 LAB — MRSA NEXT GEN BY PCR, NASAL: MRSA by PCR Next Gen: NOT DETECTED

## 2021-12-22 LAB — LACTIC ACID, PLASMA
Lactic Acid, Venous: 0.7 mmol/L (ref 0.5–1.9)
Lactic Acid, Venous: 0.9 mmol/L (ref 0.5–1.9)

## 2021-12-22 LAB — MAGNESIUM: Magnesium: 2.3 mg/dL (ref 1.7–2.4)

## 2021-12-22 SURGERY — ARTERIOVENOUS (AV) FISTULA CREATION
Anesthesia: Regional | Site: Arm Upper | Laterality: Right

## 2021-12-22 MED ORDER — ORAL CARE MOUTH RINSE: 15.0000 mL | Freq: Once | OROMUCOSAL | Status: AC

## 2021-12-22 MED ORDER — CHLORHEXIDINE GLUCONATE 4 % EX LIQD: 60.0000 mL | Freq: Once | CUTANEOUS | Status: DC

## 2021-12-22 MED ORDER — PROPOFOL 1000 MG/100ML IV EMUL
5.0000 ug/kg/min | INTRAVENOUS | Status: DC
  Filled 2021-12-22 (×3): qty 100

## 2021-12-22 MED ORDER — MIDAZOLAM HCL 2 MG/2ML IJ SOLN
INTRAMUSCULAR | Status: AC
  Administered 2021-12-22: 1 mg via INTRAVENOUS
  Filled 2021-12-22: qty 2

## 2021-12-22 MED ORDER — PROPOFOL 10 MG/ML IV BOLUS
INTRAVENOUS | Status: DC | PRN
Start: 2021-12-22 — End: 2021-12-22
  Administered 2021-12-22: 80 mg via INTRAVENOUS
  Administered 2021-12-22: 40 mg via INTRAVENOUS

## 2021-12-22 MED ORDER — MEPIVACAINE HCL (PF) 1 % IJ SOLN
INTRAMUSCULAR | Status: DC | PRN
  Administered 2021-12-22: 10 mL via PERINEURAL

## 2021-12-22 MED ORDER — FUROSEMIDE 10 MG/ML IJ SOLN: 80.0000 mg | Freq: Every day | INTRAMUSCULAR | Status: DC

## 2021-12-22 MED ORDER — LIDOCAINE-EPINEPHRINE (PF) 1 %-1:200000 IJ SOLN
INTRAMUSCULAR | Status: AC
  Filled 2021-12-22: qty 30

## 2021-12-22 MED ORDER — ALBUTEROL SULFATE HFA 108 (90 BASE) MCG/ACT IN AERS
INHALATION_SPRAY | RESPIRATORY_TRACT | Status: DC | PRN
  Administered 2021-12-22: 4 via RESPIRATORY_TRACT

## 2021-12-22 MED ORDER — SODIUM CHLORIDE 0.9 % IV SOLN: INTRAVENOUS | Status: DC

## 2021-12-22 MED ORDER — PHENYLEPHRINE 40 MCG/ML (10ML) SYRINGE FOR IV PUSH (FOR BLOOD PRESSURE SUPPORT)
PREFILLED_SYRINGE | INTRAVENOUS | Status: DC | PRN
  Administered 2021-12-22 (×2): 120 ug via INTRAVENOUS

## 2021-12-22 MED ORDER — PROPOFOL 10 MG/ML IV BOLUS
INTRAVENOUS | Status: AC
  Filled 2021-12-22: qty 20

## 2021-12-22 MED ORDER — ALBUTEROL SULFATE (2.5 MG/3ML) 0.083% IN NEBU: 2.5000 mg | INHALATION_SOLUTION | Freq: Once | RESPIRATORY_TRACT | Status: AC

## 2021-12-22 MED ORDER — FENTANYL CITRATE (PF) 250 MCG/5ML IJ SOLN
INTRAMUSCULAR | Status: AC
  Filled 2021-12-22: qty 5

## 2021-12-22 MED ORDER — ACETAMINOPHEN 10 MG/ML IV SOLN: 1000.0000 mg | Freq: Once | INTRAVENOUS | Status: DC | PRN

## 2021-12-22 MED ORDER — FUROSEMIDE 10 MG/ML IJ SOLN
INTRAMUSCULAR | Status: AC
  Filled 2021-12-22: qty 4

## 2021-12-22 MED ORDER — ONDANSETRON HCL 4 MG/2ML IJ SOLN
INTRAMUSCULAR | Status: DC | PRN
Start: 2021-12-22 — End: 2021-12-22
  Administered 2021-12-22: 4 mg via INTRAVENOUS

## 2021-12-22 MED ORDER — FUROSEMIDE 10 MG/ML IJ SOLN
INTRAMUSCULAR | Status: DC | PRN
  Administered 2021-12-22: 40 mg via INTRAMUSCULAR

## 2021-12-22 MED ORDER — 0.9 % SODIUM CHLORIDE (POUR BTL) OPTIME
TOPICAL | Status: DC | PRN
  Administered 2021-12-22: 1000 mL

## 2021-12-22 MED ORDER — FENTANYL CITRATE (PF) 100 MCG/2ML IJ SOLN: 25.0000 ug | INTRAMUSCULAR | Status: DC | PRN

## 2021-12-22 MED ORDER — ALBUTEROL SULFATE (2.5 MG/3ML) 0.083% IN NEBU
INHALATION_SOLUTION | RESPIRATORY_TRACT | Status: AC
  Administered 2021-12-22: 2.5 mg via RESPIRATORY_TRACT
  Filled 2021-12-22: qty 3

## 2021-12-22 MED ORDER — ACETAMINOPHEN 160 MG/5ML PO SOLN: 1000.0000 mg | Freq: Once | ORAL | Status: DC | PRN

## 2021-12-22 MED ORDER — MIDAZOLAM HCL 2 MG/2ML IJ SOLN
INTRAMUSCULAR | Status: AC
  Filled 2021-12-22: qty 2

## 2021-12-22 MED ORDER — PHENYLEPHRINE HCL-NACL 20-0.9 MG/250ML-% IV SOLN
INTRAVENOUS | Status: DC | PRN
  Administered 2021-12-22: 30 ug/min via INTRAVENOUS

## 2021-12-22 MED ORDER — PROPOFOL 500 MG/50ML IV EMUL
INTRAVENOUS | Status: DC | PRN
  Administered 2021-12-22: 80 ug/kg/min via INTRAVENOUS

## 2021-12-22 MED ORDER — DEXAMETHASONE SODIUM PHOSPHATE 10 MG/ML IJ SOLN
INTRAMUSCULAR | Status: DC | PRN
  Administered 2021-12-22: 5 mg via INTRAVENOUS

## 2021-12-22 MED ORDER — FENTANYL CITRATE (PF) 100 MCG/2ML IJ SOLN
INTRAMUSCULAR | Status: AC
  Administered 2021-12-22: 50 ug via INTRAVENOUS
  Filled 2021-12-22: qty 2

## 2021-12-22 MED ORDER — PROPOFOL 1000 MG/100ML IV EMUL
INTRAVENOUS | Status: AC
  Administered 2021-12-22: 50 ug/kg/min via INTRAVENOUS
  Filled 2021-12-22: qty 100

## 2021-12-22 MED ORDER — CEFAZOLIN SODIUM-DEXTROSE 2-4 GM/100ML-% IV SOLN
2.0000 g | INTRAVENOUS | Status: AC
  Administered 2021-12-22: 2 g via INTRAVENOUS

## 2021-12-22 MED ORDER — FENTANYL CITRATE (PF) 100 MCG/2ML IJ SOLN: 50.0000 ug | Freq: Once | INTRAMUSCULAR | Status: AC

## 2021-12-22 MED ORDER — FUROSEMIDE 10 MG/ML IJ SOLN
80.0000 mg | Freq: Once | INTRAMUSCULAR | Status: AC
  Administered 2021-12-22: 80 mg via INTRAVENOUS
  Filled 2021-12-22: qty 8

## 2021-12-22 MED ORDER — SUCCINYLCHOLINE CHLORIDE 200 MG/10ML IV SOSY
PREFILLED_SYRINGE | INTRAVENOUS | Status: DC | PRN
  Administered 2021-12-22: 160 mg via INTRAVENOUS

## 2021-12-22 MED ORDER — MIDAZOLAM HCL 2 MG/2ML IJ SOLN
1.0000 mg | Freq: Once | INTRAMUSCULAR | Status: AC
Start: 2021-12-22 — End: 2021-12-22

## 2021-12-22 MED ORDER — ONDANSETRON HCL 4 MG/2ML IJ SOLN
INTRAMUSCULAR | Status: AC
  Administered 2021-12-22: 4 mg
  Filled 2021-12-22: qty 2

## 2021-12-22 MED ORDER — CEFAZOLIN SODIUM-DEXTROSE 2-4 GM/100ML-% IV SOLN
INTRAVENOUS | Status: AC
  Filled 2021-12-22: qty 100

## 2021-12-22 MED ORDER — HEPARIN 6000 UNIT IRRIGATION SOLUTION
Status: AC
  Filled 2021-12-22: qty 500

## 2021-12-22 MED ORDER — CHLORHEXIDINE GLUCONATE 0.12 % MT SOLN
OROMUCOSAL | Status: AC
  Administered 2021-12-22: 15 mL via OROMUCOSAL
  Filled 2021-12-22: qty 15

## 2021-12-22 MED ORDER — HEPARIN 6000 UNIT IRRIGATION SOLUTION
Status: DC | PRN
  Administered 2021-12-22: 1

## 2021-12-22 MED ORDER — ACETAMINOPHEN 500 MG PO TABS: 1000.0000 mg | ORAL_TABLET | Freq: Once | ORAL | Status: DC | PRN

## 2021-12-22 MED ORDER — LIDOCAINE-EPINEPHRINE 1 %-1:100000 IJ SOLN
INTRAMUSCULAR | Status: DC | PRN
Start: 2021-12-22 — End: 2021-12-22
  Administered 2021-12-22: 20 mL

## 2021-12-22 MED ORDER — CHLORHEXIDINE GLUCONATE 0.12 % MT SOLN: 15.0000 mL | Freq: Once | OROMUCOSAL | Status: AC

## 2021-12-22 SURGICAL SUPPLY — 30 items
ADH SKN CLS APL DERMABOND .7 (GAUZE/BANDAGES/DRESSINGS) ×1
ARMBAND PINK RESTRICT EXTREMIT (MISCELLANEOUS) ×2 IMPLANT
BAG COUNTER SPONGE SURGICOUNT (BAG) ×2 IMPLANT
BAG SPNG CNTER NS LX DISP (BAG) ×1
CANISTER SUCT 3000ML PPV (MISCELLANEOUS) ×2 IMPLANT
CLIP LIGATING EXTRA MED SLVR (CLIP) ×2 IMPLANT
CLIP LIGATING EXTRA SM BLUE (MISCELLANEOUS) ×2 IMPLANT
COVER PROBE W GEL 5X96 (DRAPES) ×1 IMPLANT
DERMABOND ADVANCED (GAUZE/BANDAGES/DRESSINGS) ×1
DERMABOND ADVANCED .7 DNX12 (GAUZE/BANDAGES/DRESSINGS) ×1 IMPLANT
ELECT REM PT RETURN 9FT ADLT (ELECTROSURGICAL) ×2
ELECTRODE REM PT RTRN 9FT ADLT (ELECTROSURGICAL) ×1 IMPLANT
GLOVE SURG ENC MOIS LTX SZ7.5 (GLOVE) ×2 IMPLANT
GOWN STRL REUS W/ TWL LRG LVL3 (GOWN DISPOSABLE) ×2 IMPLANT
GOWN STRL REUS W/ TWL XL LVL3 (GOWN DISPOSABLE) ×1 IMPLANT
GOWN STRL REUS W/TWL LRG LVL3 (GOWN DISPOSABLE) ×4
GOWN STRL REUS W/TWL XL LVL3 (GOWN DISPOSABLE) ×2
INSERT FOGARTY SM (MISCELLANEOUS) IMPLANT
KIT BASIN OR (CUSTOM PROCEDURE TRAY) ×2 IMPLANT
KIT TURNOVER KIT B (KITS) ×2 IMPLANT
NS IRRIG 1000ML POUR BTL (IV SOLUTION) ×2 IMPLANT
PACK CV ACCESS (CUSTOM PROCEDURE TRAY) ×2 IMPLANT
PAD ARMBOARD 7.5X6 YLW CONV (MISCELLANEOUS) ×4 IMPLANT
SUT MNCRL AB 4-0 PS2 18 (SUTURE) ×2 IMPLANT
SUT PROLENE 6 0 BV (SUTURE) ×3 IMPLANT
SUT VIC AB 3-0 SH 27 (SUTURE) ×2
SUT VIC AB 3-0 SH 27X BRD (SUTURE) ×1 IMPLANT
TOWEL GREEN STERILE (TOWEL DISPOSABLE) ×2 IMPLANT
UNDERPAD 30X36 HEAVY ABSORB (UNDERPADS AND DIAPERS) ×2 IMPLANT
WATER STERILE IRR 1000ML POUR (IV SOLUTION) ×2 IMPLANT

## 2021-12-22 NOTE — Interval H&P Note (Signed)
History and Physical Interval Note:  12/22/2021 12:13 PM  Nancy [name]  has presented today for surgery, with the diagnosis of CKD.  The various methods of treatment have been discussed with the patient and family. After consideration of risks, benefits and other options for treatment, the patient has consented to  Procedure(s): RIGHT ARM ARTERIOVENOUS (AV) FISTULA CREATION VERSUS GRAFT PLACEMENT (Right) as a surgical intervention.  The patient's history has been reviewed, patient examined, no change in status, stable for surgery.  I have reviewed the patient's chart and labs.  Questions were answered to the patient's satisfaction.     Servando Snare

## 2021-12-22 NOTE — Anesthesia Procedure Notes (Signed)
Procedure Name: Intubation Date/Time: 12/22/2021 2:49 PM Performed by: Lorie Phenix, CRNA Pre-anesthesia Checklist: Patient identified, Emergency Drugs available, Suction available and Patient being monitored Patient Re-evaluated:Patient Re-evaluated prior to induction Oxygen Delivery Method: Circle system utilized Preoxygenation: Pre-oxygenation with 100% oxygen Induction Type: IV induction, Rapid sequence and Cricoid Pressure applied Laryngoscope Size: Mac and 3 Grade View: Grade II Tube type: Oral Tube size: 7.5 mm Number of attempts: 1 Airway Equipment and Method: Stylet Placement Confirmation: ETT inserted through vocal cords under direct vision, positive ETCO2 and breath sounds checked- equal and bilateral Secured at: 22 cm Tube secured with: Tape Dental Injury: Teeth and Oropharynx as per pre-operative assessment

## 2021-12-22 NOTE — Anesthesia Procedure Notes (Signed)
Anesthesia Regional Block: Supraclavicular block   Pre-Anesthetic Checklist: , timeout performed,  Correct Patient, Correct Site, Correct Laterality,  Correct Procedure, Correct Position, site marked,  Risks and benefits discussed,  Surgical consent,  Pre-op evaluation,  At surgeon's request and post-op pain management  Laterality: Right and Upper  Prep: chloraprep       Needles:  Injection technique: Single-shot      Needle Length: 5cm  Needle Gauge: 21     Additional Needles: Arrow StimuQuik ECHO Echogenic Stimulating PNB Needle  Procedures:,,,, ultrasound used (permanent image in chart),,    Narrative:  Start time: 12/22/2021 12:48 PM End time: 12/22/2021 12:58 PM Injection made incrementally with aspirations every 5 mL.  Performed by: Personally  Anesthesiologist: Oleta Mouse, MD

## 2021-12-22 NOTE — Transfer of Care (Signed)
Immediate Anesthesia Transfer of Care Note  Patient: Nancy [name]  Procedure(s) Performed: RIGHT ARM ARTERIOVENOUS (AV) BRACIOCPHELAIC FISTULA CREATION (Right: Arm Upper)  Patient Location: PACU  Anesthesia Type:General  Level of Consciousness: drowsy and Patient remains intubated per anesthesia plan  Airway & Oxygen Therapy: Patient remains intubated per anesthesia plan  Post-op Assessment: Report given to RN and Post -op Vital signs reviewed and stable  Post vital signs: Reviewed and stable  Last Vitals:  Vitals Value Taken Time  BP 129/78 12/22/21 1659  Temp 36.4 C 12/22/21 1645  Pulse 57 12/22/21 1701  Resp 4 12/22/21 1701  SpO2 97 % 12/22/21 1701  Vitals shown include unvalidated device data.  Last Pain:  Vitals:   12/22/21 1119  TempSrc:   PainSc: 0-No pain         Complications: No notable events documented.

## 2021-12-22 NOTE — Progress Notes (Signed)
Pt was transported to 6C12X with no complications. Pt is currently stable. ICU RT was given report and will continue to monitor

## 2021-12-22 NOTE — Op Note (Signed)
° ° °  Patient name: Nancy [name] MRN: [id number] DOB: [birth year] Sex: female  12/22/2021 Pre-operative Diagnosis: esrd Post-operative diagnosis:  Same Surgeon:  Erlene Quan C. Donzetta Matters, MD Assistant: Arlee Muslim, PA Procedure Performed:  Right arm brachial artery to cephalic vein av fistula creation  Indications: 61 year old female with chronic kidney disease she is now indicated for permanent dialysis access.  Assistant was necessary to facilitate exposure and expedite the case.  Findings: Brachial artery on the right was very diminutive over free of disease.  The cephalic vein measured approximately 3-1/2 mm.  At completion there was some pulsatility in the fistula that could be traced with Doppler and there was no palpable radial artery pulse but there was a strong signal that was augmented with compression of the fistula.   Procedure:  The patient was identified in the holding area and taken to the operating room where general anesthesia was induced.  She was sterilely prepped and draped in the right upper extremity usual fashion, antibiotics were minister timeout was called.  Ultrasound was used to identify a suitable cephalic vein although the brachial artery did appear quite diminutive I elected graft would not be an option.  A transverse incision was made.  Vein was dissected free and marked for orientation.  I dissected through the deep fascia identified the artery placed Vesseloops around this.  The vein was clamped distally transected and tied off.  I flushed with heparinized saline and clamped it.  Clamped the artery distally proximally opened longitudinally flushed with heparinized saline distally.  The vein was sewn end-to-side with 6-0 Prolene suture.  Prior completion the usual flushing techniques were performed.  We then had pulsatility in the vein we freed up some of the soft tissue we could trace the flow with Doppler.  There was a signal at the radial artery at the wrist that augmented  with compression of the fistula.  Satisfied with this we obtain pneumostasis we thoroughly irrigated the wound.  We closed in layers of Vicryl and Monocryl.  After the procedure was terminated the patient cannot be extubated she was transferred to the recovery area intubated with plans for admission to the ICU.  All counts were correct at completion.  EBL: 20cc   Kalee Mcclenathan C. Donzetta Matters, MD [facility] Office: [phone] Pager: [phone]

## 2021-12-22 NOTE — Consult Note (Addendum)
NAME:  Nancy [name], MRN:  [id number], DOB:  1961/04/15, LOS: 0 ADMISSION DATE:  12/22/2021, CONSULTATION DATE:  12/22/2021 REFERRING MD:  Dr. Caffie Damme, CHIEF COMPLAINT: Acute respiratory distress/vent management  History of Present Illness:  Nancy [name] is a 61 y.o. female with history of T2DM, diabetic neuropathy, HTN, CKD stage IV, HFpEF, CAD, PAD, carotid artery disease, HLD, anemia, IBS, CVA (2020) was admitted today for permanent dialysis access with vascular surgery.  Prior to surgery, patient desatted to the 70s 30 minutes after supra clavicle block.  Patient was transition to BiPAP but began to vomit so transition to nonrebreather mask and eventually switched to 2.5 L O2 [place] with O2 sats above 92%.  Patient continued to desat and was eventually intubated before surgery.  After her dialysis access, she was unable to be extubated so she was transferred to the PACU.   PCCM consulted respiratory distress/ventilator management  Pertinent  Medical History   former smoker, DM (with peripheral neuropathy, nephropathy), CKD (stage IV-V), CAD (non-obstructive 04/2021), chronic diastolic CHF (in setting of CKD and likely due to long-standing and poorly controlled HTN), murmur (no valvular stenosis or regurgitation 10/06/21 echo), dyspnea, carotid artery disease (right ICA occlusion, 40-59% left ICA 02/2021), anemia, HLD, IBS, gastroparesis, CVA (2020), thyroid nodule (2004), hard of hearing. TAH (10/22/02), cholecystectomy (03/05/20 for gallstone pancreatitis), obesity.  Significant Hospital Events: Including procedures, antibiotic start and stop dates in addition to other pertinent events   01/06: Permanent HD fistula 01/06: Intubated and PCCM consult  Interim History / Subjective:  Patient sedated on ventilator Per RN, patient unable to be extubated after access placed Per spouse, patient has had some challenges with fluid in her lungs and unable to lay flat at home. Objective    Blood pressure 132/69, pulse (!) 56, temperature (!) 97.5 F (36.4 C), resp. rate 17, height 5' 3" (1.6 m), weight 109.8 kg, SpO2 97 %.        Intake/Output Summary (Last 24 hours) at 12/22/2021 1726 Last data filed at 12/22/2021 1700 Gross per 24 hour  Intake 500 ml  Output 100 ml  Net 400 ml   Filed Weights   12/20/21 1020 12/22/21 1115  Weight: 109.8 kg 109.8 kg    Examination: General: Acutely ill-appearing, obese woman.  Sedated and intubated.  NAD HENT: ET tube in place.  Pupils constricted, sluggish to light Lungs: Mechanical lung sounds.  Distant crackles.  No wheezing. Cardiovascular: Bradycardic. Regular rhythm.  S1, S2.  3+ BLE edema Abdomen: Soft.  Mildly distended.  Normal BS Extremities: Warm.  Edematous.  Feet ulcers wrapped with dressing. Neuro: Sedated.  Moves all extremities.   Resolved Hospital Problem list     Assessment & Plan:  Acute hypoxic respiratory failure Patient with worsening heart failure in the setting of progressive CKD developed respiratory failure status post regional block likely due to fluid backup in lungs from being laid flat in addition to sedation.  Unable to be extubated status post AV fistula placement. CXR showing diffuse interstitial opacities likely secondary to pulmonary edema versus atypical infection.  ABG shows pH 7.35, PCO2 50.6, PO2 60 and bicarb 30.  Plan to adjust patient's vent settings and diurese. -- Continue full vent support, PRVC (increase PEEP from 5->8, increase RR from 17->20) -- Continue propofol for sedation -- Daily SBT -- Continue IV diuresis  Acute on chronic diastolic HF Echo on 35/36/1443 shows EF 50 to 55%, moderate LVH, G2DD.  Patient fluid overloaded on exam.  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  No electrolyte abnormalities. --VVS following, appreciate recs --Strict I&O's -- For nephrotoxic agents  Uncontrolled T2DM Diabetic neuropathy A1c 6.8 three months ago.  Patient with diabetic peripheral neuropathy with diabetic foot wounds followed by Dr. Sharol Given and wound care. --CBG monitoring --Wound care consult  Iron deficiency anemia Follow-up CBC   Best Practice (right click and "Reselect all SmartList Selections" daily)   Diet/type: NPO DVT prophylaxis: DOAC GI prophylaxis: PPI Lines: N/A Foley:  N/A Code Status:  full code Last date of multidisciplinary goals of care discussion [spoke to husband at bedside]  Labs   CBC: Recent Labs  Lab 12/22/21 1139  HGB 10.2*  HCT 30.0*    Basic Metabolic Panel: Recent Labs  Lab 12/22/21 1139  NA 135  K 3.9  CL 100  GLUCOSE 87  BUN 49*  CREATININE 4.20*   GFR: Estimated Creatinine Clearance: 16.7 mL/min (A) (by C-G formula based on SCr of 4.2 mg/dL (H)). No results for input(s): PROCALCITON, WBC, LATICACIDVEN in the last 168 hours.  Liver Function Tests: No results for input(s): AST, ALT, ALKPHOS, BILITOT, PROT, ALBUMIN in the last 168 hours. No results for input(s): LIPASE, AMYLASE in the last 168 hours. No results for input(s): AMMONIA in the last 168 hours.  ABG    Component Value Date/Time   PHART 7.302 (L) 04/27/2021 0811   PCO2ART 52.3 (H) 04/27/2021 0811   PO2ART 135 (H) 04/27/2021 0811   HCO3 25.2 10/24/2021 1750   TCO2 27 12/22/2021 1139   ACIDBASEDEF 1.0 04/27/2021 0811   O2SAT 86.0 10/24/2021 1750     Coagulation Profile: No results for input(s): INR, PROTIME in the last 168 hours.  Cardiac Enzymes: No results for input(s): CKTOTAL, CKMB, CKMBINDEX, TROPONINI in the last 168 hours.  HbA1C: HbA1c, POC (controlled diabetic range)  Date/Time Value Ref Range Status  08/27/2019 04:36 PM 12.5 (A) 0.0 - 7.0 % Final  08/27/2018 11:02 AM 13.2 (A) 0.0 - 7.0 % Final   Hgb A1c  MFr Bld  Date/Time Value Ref Range Status  09/04/2021 12:22 PM 6.8 (H) 4.8 - 5.6 % Final    Comment:    (NOTE) Pre diabetes:          5.7%-6.4%  Diabetes:              >6.4%  Glycemic control for   <7.0% adults with diabetes   07/09/2021 12:39 AM QNSTST 4.8 - 5.6 % Final    Comment:    (NOTE) Test not performed. Insufficient specimen to perform or complete analysis.      Acquanetta Sit notified 06/17/2021         Prediabetes: 5.7 - 6.4         Diabetes: >6.4         Glycemic control for adults with diabetes: <7.0     CBG: Recent Labs  Lab 12/22/21 1115 12/22/21 1323 12/22/21 1644  GLUCAP 104* 113* 139*    Review of Systems:   Unable to assess due to patient being sedated  Past Medical History:  She,  has a past medical history of Anemia (2006), Carotid artery disease ([place]), CHF (congestive heart failure) ([place]), Chronic kidney disease, Coronary artery disease, Depression (2014), Diabetes mellitus with  neurological manifestation ([place]) (2006), Diabetic peripheral neuropathy ([place]) (04/26/2020), Dyspnea, Fracture of left ankle (1997), Gastroparesis (07/2016), Heart murmur, HOH (hard of hearing) (2004), Hyperlipidemia (2006), Hypertension (2006), IBS (irritable bowel syndrome) (2002), Leukopenia (2015), Macular degeneration (11/2019), Shingles (2009), Stroke [facility]) (2020), and Thyroid nodule (2004).   Surgical History:   Past Surgical History:  Procedure Laterality Date   ABDOMINAL HYSTERECTOMY  2005   CATARACT EXTRACTION Left 11/2019   CESAREAN SECTION  1983    CHOLECYSTECTOMY N/A 03/05/2020   Procedure: LAPAROSCOPIC CHOLECYSTECTOMY WITH INTRAOPERATIVE CHOLANGIOGRAM;  Surgeon: Donnie Mesa, MD;  Location: [facility];  Service: General;  Laterality: N/A;   ESOPHAGOGASTRODUODENOSCOPY (EGD) WITH PROPOFOL Left 08/26/2014   Procedure: ESOPHAGOGASTRODUODENOSCOPY (EGD) WITH PROPOFOL;  Surgeon: Arta Silence, MD;  Location: [facility];  Service: Endoscopy;  Laterality: Left;    ESOPHAGOGASTRODUODENOSCOPY (EGD) WITH PROPOFOL N/A 03/03/2020   Procedure: ESOPHAGOGASTRODUODENOSCOPY (EGD) WITH PROPOFOL;  Surgeon: Jerene Bears, MD;  Location: [facility];  Service: Gastroenterology;  Laterality: N/A;   RIGHT HEART CATH N/A 07/14/2021   Procedure: RIGHT HEART CATH;  Surgeon: Larey Dresser, MD;  Location: [facility];  Service: Cardiovascular;  Laterality: N/A;   RIGHT/LEFT HEART CATH AND CORONARY ANGIOGRAPHY N/A 04/27/2021   Procedure: RIGHT/LEFT HEART CATH AND CORONARY ANGIOGRAPHY;  Surgeon: [name], MD;  Location: [facility];  Service: Cardiovascular;  Laterality: N/A;     Social History:   reports that she has quit smoking. Her smoking use included cigarettes. She has a 0.13 pack-year smoking history. She has never used smokeless tobacco. She reports that she does not drink alcohol and does not use drugs.   Family History:  Her family history includes Breast cancer in her maternal aunt and maternal grandmother; Colon cancer in her maternal grandfather; Congestive Heart Failure in her mother; Diabetes in her father and mother; Heart attack in her sister; Heart disease in her brother and mother; Hyperlipidemia in her brother; Hypertension in her brother and mother; Thyroid disease in her mother.   Allergies Allergies  Allergen Reactions   Hydralazine Hcl Other (See Comments)    Hair loss   Hydrocodone Itching and Other (See Comments)    Upset stomach   Metformin And Related Nausea And Vomiting and Other (See Comments)    Stomach pains, also   Other Nausea Only and Other (See Comments)    Lettuce- Does not digest this!!   Plaquenil [Hydroxychloroquine Sulfate] Hives   Shellfish-Derived Products Nausea Only and Other (See Comments)    Caused an upset stomach   Shrimp (Diagnostic) Nausea Only and Other (See Comments)    Upset stomach    Sulfa Antibiotics Hives     Home Medications  Prior to Admission medications   Medication Sig Start  Date End Date Taking? Authorizing Provider  acetaminophen (TYLENOL) 500 MG tablet Take 500 mg by mouth every 6 (six) hours as needed for moderate pain or headache.   Yes [provider]  amLODipine (NORVASC) 10 MG tablet Take 1 tablet (10 mg total) by mouth daily. 11/07/21  Yes Larey Dresser, MD  aspirin 81 MG EC tablet Take 1 tablet (81 mg total) by mouth daily. 09/26/21  Yes [place], Maricela Bo, FNP  atorvastatin (LIPITOR) 40 MG tablet Take 1 tablet (40 mg total) by mouth daily. 11/07/21  Yes Larey Dresser, MD  calcium acetate (PHOSLO) 667 MG capsule Take 1 capsule (667 mg total) by mouth 3 (three) times daily with meals. 11/07/21  Yes [name],  MD  carvedilol (COREG) 12.5 MG tablet Take 1.5 tablets (18.75 mg total) by mouth 2 (two) times daily with a meal. 11/07/21  Yes Larey Dresser, MD  ferrous sulfate 325 (65 FE) MG tablet Take 1 tablet (325 mg total) by mouth daily with breakfast. 11/07/21  Yes [name], MD  insulin glargine (LANTUS) 100 UNIT/ML Solostar Pen Inject 30 Units into the skin at bedtime. 11/07/21  Yes [name], MD  insulin lispro (HUMALOG KWIKPEN) 100 UNIT/ML KwikPen Inject 4 units before breakfast and dinner. 11/07/21  Yes [name], MD  risperiDONE (RISPERDAL) 3 MG tablet TAKE 1 TABLET (3 MG TOTAL) BY MOUTH AT BEDTIME. 02/06/21 02/06/22 Yes Eulis Canner E, NP  torsemide (DEMADEX) 20 MG tablet Take 4 tablets (80 mg total) by mouth 2 (two) times daily. 12/13/21  Yes [place], Maricela Bo, FNP  Blood Glucose Monitoring Suppl (ONETOUCH VERIO) w/Device KIT Check blood sugar three times daily. E11.40 Patient not taking: Reported on 12/20/2021 05/06/20   [name], MD  Continuous Blood Gluc Receiver (DEXCOM G6 RECEIVER) DEVI 1 Device by Does not apply route daily. 11/15/21   [name], MD  Continuous Blood Gluc Sensor (DEXCOM G6 SENSOR) MISC 1 packet by Does not apply route daily. 11/15/21   [name], MD   Continuous Blood Gluc Transmit (DEXCOM G6 TRANSMITTER) MISC 1 packet by Does not apply route daily. 11/15/21   [name], MD  glucose blood test strip Use as instructedCheck blood sugar three times daily. E11.40 Patient not taking: Reported on 12/20/2021 07/11/20   [name], MD  Insulin Pen Needle 32G X 4 MM MISC Use to inject insulin as directed. 11/07/21   [name], MD  metolazone (ZAROXOLYN) 2.5 MG tablet Take 1 tablet (2.5 mg total) by mouth once for 1 dose. 12/13/21 12/14/21  Rafael Bihari, FNP  potassium chloride SA (KLOR-CON M) 20 MEQ tablet Take 1 tablet (20 mEq total) by mouth once for 1 dose. 12/13/21 12/14/21  Rafael Bihari, FNP  Insulin NPH Isophane & Regular (RELION 70/30 [place]) Inject 35 Units into the skin 2 (two) times daily.  08/17/14  [provider]     Critical care time: 8

## 2021-12-22 NOTE — Progress Notes (Addendum)
Patient received 1 mg versed and 50 mcg fentanyl during regional nerve block of operative right arm on 2L O2. Patient stable during and after nerve block. Patient began to desat in the 70's about 30 mins after the block. Turned O2 up to 4L, O2 sat remained 85-86. Called Dr. Ermalene Postin. Obtained portable 1 view chest xray and gave patient albuterol neb treatment. Dr. Ermalene Postin reviewed xray and requested bipap. Patient began to vomit before bipap was placed. Dr. Ermalene Postin requested non-rebreather instead and to give 4mg  zofran for the vomitting. Respiratory came by and said to switch back to nasal cannula if patient gets sleepy. RT on standby if needed. Patient back on 2.5L O2 nasal cannula with O2 sat at 94.

## 2021-12-23 DIAGNOSIS — J9601 Acute respiratory failure with hypoxia: Secondary | ICD-10-CM

## 2021-12-23 LAB — CBC
HCT: 31.2 % — ABNORMAL LOW (ref 36.0–46.0)
HCT: 29.2 % — ABNORMAL LOW (ref 36.0–46.0)
Hemoglobin: 10.3 g/dL — ABNORMAL LOW (ref 12.0–15.0)
Hemoglobin: 9.3 g/dL — ABNORMAL LOW (ref 12.0–15.0)
MCH: 29.7 pg (ref 26.0–34.0)
MCH: 28.8 pg (ref 26.0–34.0)
MCHC: 33 g/dL (ref 30.0–36.0)
MCHC: 31.8 g/dL (ref 30.0–36.0)
MCV: 89.9 fL (ref 80.0–100.0)
MCV: 90.4 fL (ref 80.0–100.0)
Platelets: 291 10*3/uL (ref 150–400)
Platelets: 282 10*3/uL (ref 150–400)
RBC: 3.47 MIL/uL — ABNORMAL LOW (ref 3.87–5.11)
RBC: 3.23 MIL/uL — ABNORMAL LOW (ref 3.87–5.11)
RDW: 13.6 % (ref 11.5–15.5)
RDW: 13.7 % (ref 11.5–15.5)
WBC: 10 10*3/uL (ref 4.0–10.5)
WBC: 12 10*3/uL — ABNORMAL HIGH (ref 4.0–10.5)
nRBC: 0 % (ref 0.0–0.2)
nRBC: 0 % (ref 0.0–0.2)

## 2021-12-23 LAB — HEMOGLOBIN A1C
Hgb A1c MFr Bld: 5.3 % (ref 4.8–5.6)
Mean Plasma Glucose: 105.41 mg/dL

## 2021-12-23 LAB — RENAL FUNCTION PANEL
Albumin: 3.1 g/dL — ABNORMAL LOW (ref 3.5–5.0)
Anion gap: 12 (ref 5–15)
BUN: 52 mg/dL — ABNORMAL HIGH (ref 8–23)
CO2: 23 mmol/L (ref 22–32)
Calcium: 8.9 mg/dL (ref 8.9–10.3)
Chloride: 97 mmol/L — ABNORMAL LOW (ref 98–111)
Creatinine, Ser: 4.21 mg/dL — ABNORMAL HIGH (ref 0.44–1.00)
GFR, Estimated: 11 mL/min — ABNORMAL LOW (ref >60)
Glucose, Bld: 161 mg/dL — ABNORMAL HIGH (ref 70–99)
Phosphorus: 6.1 mg/dL — ABNORMAL HIGH (ref 2.5–4.6)
Potassium: 4.6 mmol/L (ref 3.5–5.1)
Sodium: 132 mmol/L — ABNORMAL LOW (ref 135–145)

## 2021-12-23 LAB — CREATININE, SERUM
Creatinine, Ser: 4.42 mg/dL — ABNORMAL HIGH (ref 0.44–1.00)
GFR, Estimated: 11 mL/min — ABNORMAL LOW (ref >60)

## 2021-12-23 LAB — GLUCOSE, CAPILLARY
Glucose-Capillary: 197 mg/dL — ABNORMAL HIGH (ref 70–99)
Glucose-Capillary: 147 mg/dL — ABNORMAL HIGH (ref 70–99)
Glucose-Capillary: 109 mg/dL — ABNORMAL HIGH (ref 70–99)

## 2021-12-23 LAB — TRIGLYCERIDES: Triglycerides: 73 mg/dL (ref <150)

## 2021-12-23 MED ORDER — INSULIN ASPART 100 UNIT/ML IJ SOLN: 0.0000 [IU] | Freq: Every day | INTRAMUSCULAR | Status: DC

## 2021-12-23 MED ORDER — CARVEDILOL 6.25 MG PO TABS
18.7500 mg | ORAL_TABLET | Freq: Two times a day (BID) | ORAL | Status: DC
  Administered 2021-12-23 – 2021-12-24 (×2): 18.75 mg via ORAL
  Filled 2021-12-23 (×2): qty 1

## 2021-12-23 MED ORDER — ATORVASTATIN CALCIUM 40 MG PO TABS
40.0000 mg | ORAL_TABLET | Freq: Every day | ORAL | Status: DC
  Administered 2021-12-23: 40 mg via ORAL
  Filled 2021-12-23: qty 1

## 2021-12-23 MED ORDER — TORSEMIDE 20 MG PO TABS
80.0000 mg | ORAL_TABLET | Freq: Two times a day (BID) | ORAL | Status: DC
  Administered 2021-12-23 – 2021-12-24 (×2): 80 mg via ORAL
  Filled 2021-12-23 (×2): qty 4

## 2021-12-23 MED ORDER — FERROUS SULFATE 325 (65 FE) MG PO TABS
325.0000 mg | ORAL_TABLET | Freq: Every day | ORAL | Status: DC
  Administered 2021-12-24: 325 mg via ORAL
  Filled 2021-12-23: qty 1

## 2021-12-23 MED ORDER — INSULIN ASPART 100 UNIT/ML IJ SOLN
4.0000 [IU] | Freq: Three times a day (TID) | INTRAMUSCULAR | Status: DC
  Administered 2021-12-23 – 2021-12-24 (×3): 4 [IU] via SUBCUTANEOUS

## 2021-12-23 MED ORDER — CHLORHEXIDINE GLUCONATE CLOTH 2 % EX PADS
6.0000 | MEDICATED_PAD | Freq: Every day | CUTANEOUS | Status: DC
  Administered 2021-12-23: 6 via TOPICAL

## 2021-12-23 MED ORDER — INSULIN DETEMIR 100 UNIT/ML ~~LOC~~ SOLN
20.0000 [IU] | Freq: Every day | SUBCUTANEOUS | Status: DC
  Administered 2021-12-23: 20 [IU] via SUBCUTANEOUS
  Filled 2021-12-23 (×2): qty 0.2

## 2021-12-23 MED ORDER — INSULIN ASPART 100 UNIT/ML IJ SOLN
0.0000 [IU] | Freq: Three times a day (TID) | INTRAMUSCULAR | Status: DC
  Administered 2021-12-23: 1 [IU] via SUBCUTANEOUS

## 2021-12-23 MED ORDER — RISPERIDONE 3 MG PO TABS
3.0000 mg | ORAL_TABLET | Freq: Every day | ORAL | Status: DC
  Administered 2021-12-23: 3 mg via ORAL
  Filled 2021-12-23 (×2): qty 1

## 2021-12-23 MED ORDER — FUROSEMIDE 10 MG/ML IJ SOLN
120.0000 mg | Freq: Every day | INTRAVENOUS | Status: DC
  Administered 2021-12-23 – 2021-12-24 (×2): 120 mg via INTRAVENOUS
  Filled 2021-12-23: qty 12
  Filled 2021-12-23: qty 10

## 2021-12-23 MED ORDER — ACETAMINOPHEN 500 MG PO TABS: 500.0000 mg | ORAL_TABLET | Freq: Four times a day (QID) | ORAL | Status: DC | PRN

## 2021-12-23 MED ORDER — METOLAZONE 2.5 MG PO TABS: 2.5000 mg | ORAL_TABLET | Freq: Once | ORAL | Status: DC

## 2021-12-23 MED ORDER — HEPARIN SODIUM (PORCINE) 5000 UNIT/ML IJ SOLN
5000.0000 [IU] | Freq: Three times a day (TID) | INTRAMUSCULAR | Status: DC
  Administered 2021-12-23 – 2021-12-24 (×4): 5000 [IU] via SUBCUTANEOUS
  Filled 2021-12-23 (×3): qty 1

## 2021-12-23 MED ORDER — CALCIUM ACETATE (PHOS BINDER) 667 MG PO CAPS
667.0000 mg | ORAL_CAPSULE | Freq: Three times a day (TID) | ORAL | Status: DC
  Administered 2021-12-23 – 2021-12-24 (×3): 667 mg via ORAL
  Filled 2021-12-23 (×3): qty 1

## 2021-12-23 MED ORDER — ASPIRIN EC 81 MG PO TBEC
81.0000 mg | DELAYED_RELEASE_TABLET | Freq: Every day | ORAL | Status: DC
  Administered 2021-12-23 – 2021-12-24 (×2): 81 mg via ORAL
  Filled 2021-12-23 (×2): qty 1

## 2021-12-23 MED ORDER — AMLODIPINE BESYLATE 10 MG PO TABS
10.0000 mg | ORAL_TABLET | Freq: Every day | ORAL | Status: DC
  Administered 2021-12-23 – 2021-12-24 (×2): 10 mg via ORAL
  Filled 2021-12-23 (×2): qty 1

## 2021-12-23 NOTE — Plan of Care (Signed)

## 2021-12-23 NOTE — Progress Notes (Signed)
RT called to patient's room due to self-extubation.  Patient is alert, no increased WOB, able to speak.  Patient placed on [place] 3L, no stridor noted.  RN at bedside.  RT will continue to monitor as needed.

## 2021-12-23 NOTE — Progress Notes (Signed)
°  Daily Progress Note  S/p: Right upper extremity brachiocephalic fistula creation  Subjective: Patient remains intubated and sedated  Objective: Vitals:   12/23/21 0813 12/23/21 0922  BP:  (!) 152/88  Pulse:  74  Resp:  18  Temp:    SpO2: 100% 94%    Physical Examination Lightly palpable pulse in the fistula Dopplerable signal at the wrist Intubated, sedated -PEEP 8   ASSESSMENT/PLAN:  1 Day Post-Op right arm brachiocephalic fistula creation Patient remained intubated postoperatively, and initially thought to have phrenic nerve palsy from block, however on chest x-ray patient has significant perihilar airspace disease-pneumonia versus edema  Appreciate critical care involvement in excellent care from nursing. Continue current medication regimen, extubate when able.   Cassandria Santee MD MS [facility] [phone] 12/23/2021  10:25 AM

## 2021-12-23 NOTE — Consult Note (Addendum)
[facility] Nurse Consult Note: Reason for Consult:Chronic, nonhealing wound to left heel. Patient known to [place] from multiple previous and recent admissions. Known osteomyelitis, PAD. Conversations between patient, family and providers have been regarding amputation of this extremity, but patient has not agreed to procedure. WOC consulted for topical care guidance. Neither Unna's boots nor enzymatic debriding agents (Santyl/collagenase) are recommended for this clinical presentation Wound type: pressure injury in the presence of infection and arterial insufficiency Pressure Injury POA: Yes Measurement:Per nursing flow sheet Wound bed: per nursing flow sheet Drainage (amount, consistency, odor)  Periwound: intact, dry Dressing procedure/placement/frequency:  I will provide guidance for nursing for the care of the patient's left heel chronic wounds using a NS cleanse and silver hydrofiber dressing.  This is to be changed daily and foot/feet placed into pressure redistribution heel boots (Prevalon).  A sacral foam dressing is to be placed for pressure injury prevention to this area.  I have discussed the POC with the Bedside RN, Flint Melter.  [facility] nursing team will not follow, but will remain available to this patient, the nursing and medical teams.  Please re-consult if needed. Thanks, Maudie Flakes, MSN, RN, [place], Arther Abbott  Pager# [phone]

## 2021-12-23 NOTE — Consult Note (Signed)
NAME:  [name], MRN:  [id number], DOB:  May 14, 1961, LOS: 1 ADMISSION DATE:  12/22/2021, CONSULTATION DATE:  12/22/2021 REFERRING MD:  Dr. Caffie Damme, CHIEF COMPLAINT: Acute respiratory distress/vent management  History of Present Illness:  Nancy [name] is a 61 y.o. female with history of T2DM, diabetic neuropathy, HTN, CKD stage IV, HFpEF, CAD, PAD, carotid artery disease, HLD, anemia, IBS, CVA (2020) was admitted today for permanent dialysis access with vascular surgery.  Prior to surgery, patient desatted to the 70s 30 minutes after supra clavicle block.  Patient was transition to BiPAP but began to vomit so transition to nonrebreather mask and eventually switched to 2.5 L O2 [place] with O2 sats above 92%.  Patient continued to desat and was eventually intubated before surgery.  After her dialysis access, she was unable to be extubated so she was transferred to the PACU.   PCCM consulted respiratory distress/ventilator management  Pertinent  Medical History   former smoker, DM (with peripheral neuropathy, nephropathy), CKD (stage IV-V), CAD (non-obstructive 04/2021), chronic diastolic CHF (in setting of CKD and likely due to long-standing and poorly controlled HTN), murmur (no valvular stenosis or regurgitation 10/06/21 echo), dyspnea, carotid artery disease (right ICA occlusion, 40-59% left ICA 02/2021), anemia, HLD, IBS, gastroparesis, CVA (2020), thyroid nodule (2004), hard of hearing. TAH (10/22/02), cholecystectomy (03/05/20 for gallstone pancreatitis), obesity.  Significant Hospital Events: Including procedures, antibiotic start and stop dates in addition to other pertinent events   01/06: Permanent HD fistula 01/06: Intubated and PCCM consult 01/07: Diuresed and self extubated.    Interim History / Subjective:  Doing well this morning.  Occasional coughing.  Objective   Blood pressure (!) 162/76, pulse 76, temperature (!) 96.9 F (36.1 C), temperature source Axillary, resp.  rate 15, height 5\' 3"  (1.6 m), weight 109.8 kg, SpO2 96 %.    Vent Mode: PRVC FiO2 (%):  [40 %-100 %] 40 % Set Rate:  [18 bmp-20 bmp] 20 bmp Vt Set:  [410 mL] 410 mL PEEP:  [5 cmH20-8 cmH20] 8 cmH20 Plateau Pressure:  [20 cmH20-37 cmH20] 25 cmH20   Intake/Output Summary (Last 24 hours) at 12/23/2021 1121 Last data filed at 12/23/2021 1034 Gross per 24 hour  Intake 906.16 ml  Output 1525 ml  Net -618.84 ml    Filed Weights   12/20/21 1020 12/22/21 1115  Weight: 109.8 kg 109.8 kg    Examination: General: Acutely ill-appearing, obese woman.  Sedated and intubated.  NAD HENT: ET tube in place.  Pupils constricted, sluggish to light Lungs: Mechanical lung sounds.  Distant crackles.  No wheezing. Cardiovascular: Bradycardic. Regular rhythm.  S1, S2.  3+ BLE edema Abdomen: Soft.  Mildly distended.  Normal BS Extremities: Warm.  Edematous.  Feet ulcers wrapped with dressing. Neuro: Sedated.  Moves all extremities.   Ancillary tests personally reviewed:  Creatinine 4.42 Sodium 132  Assessment & Plan:  Acute hypoxic respiratory failure Acute on chronic diastolic HF CKD stage IV progressing to ESRD Diabetic nephropathy Uncontrolled T2DM Diabetic neuropathy Iron deficiency anemia  Plan:  -Plan is to diurese this morning and proceed to SBT and extubation. -In the interim patient self extubated and appears to be tolerating.  Continue current diuretic plan. -We will advance diet and resume home medications. - Home insulin ordered.  Best Practice (right click and "Reselect all SmartList Selections" daily)   Diet/type: Regular consistency (see orders) DVT prophylaxis: prophylactic heparin  GI prophylaxis: PPI Lines: N/A Foley:  N/A Code Status:  full code Last date  of multidisciplinary goals of care discussion [spoke to husband at bedside]  CRITICAL CARE Performed by: Kipp Brood   Total critical care time: 40 minutes  Critical care time was exclusive of separately  billable procedures and treating other patients.  Critical care was necessary to treat or prevent imminent or life-threatening deterioration.  Critical care was time spent personally by me on the following activities: development of treatment plan with patient and/or surrogate as well as nursing, discussions with consultants, evaluation of patient's response to treatment, examination of patient, obtaining history from patient or surrogate, ordering and performing treatments and interventions, ordering and review of laboratory studies, ordering and review of radiographic studies, pulse oximetry, re-evaluation of patient's condition and participation in multidisciplinary rounds.  Kipp Brood, MD [facility] ICU Physician [place]  Pager: [phone] Mobile: [phone] After hours: [phone].   12/23/2021, 11:29 AM

## 2021-12-24 ENCOUNTER — Encounter (HOSPITAL_COMMUNITY): Payer: Self-pay | Admitting: Vascular Surgery

## 2021-12-24 DIAGNOSIS — J9601 Acute respiratory failure with hypoxia: Secondary | ICD-10-CM | POA: Diagnosis not present

## 2021-12-24 LAB — GLUCOSE, CAPILLARY
Glucose-Capillary: 85 mg/dL (ref 70–99)
Glucose-Capillary: 109 mg/dL — ABNORMAL HIGH (ref 70–99)

## 2021-12-24 MED ORDER — TRAMADOL HCL 50 MG PO TABS: 50.0000 mg | ORAL_TABLET | Freq: Four times a day (QID) | ORAL | 0 refills | Status: DC | PRN

## 2021-12-24 NOTE — Progress Notes (Signed)
°  Daily Progress Note  S/p: Right upper extremity brachiocephalic fistula creation  Subjective: Room air today. Doing well   Objective: Vitals:   12/24/21 0700 12/24/21 0800  BP: 129/68   Pulse: 70 74  Resp: 13 16  Temp:    SpO2: 92% 99%    Physical Examination Lightly palpable pulse in the fistula Dopplerable signal at the wrist Neuro intact Regular rate Non-labored respirations   ASSESSMENT/PLAN:  2 Days Post-Op right arm brachiocephalic fistula creation  Now extubated, doing well PT/OT today, Home once cleared.  Discussed with Dr. Lynetta Mare who agrees.   Cassandria Santee MD MS [facility] [phone] 12/24/2021  9:56 AM

## 2021-12-24 NOTE — Evaluation (Signed)
Physical Therapy Evaluation & Discharge Patient Details Name: LAQUITTA DOMINSKI MRN: [id number] DOB: Apr 25, 1961 Today's Date: 12/24/2021  History of Present Illness  Pt is a 61 y.o. female admitted 12/22/2021 for sx to allow permanent dialysis access; pt desaturated ultimately requiring intubation for procedure. Self-extubated 1/7. PMH includes ESRD, HF, CAD, CKD, DM, peripheral neuropathy, HTN, IBS, stroke, shingles, chronic foot wounds.   Clinical Impression  Patient evaluated by Physical Therapy with no further acute PT needs identified. PTA, pt requires assist from husband from limited household ambulation and transfers; pt typically sits in chair with food and phone set-up by husband while he is at work, uses [facility] for toileting. Today, pt requiring min-modA for transfers and ambulation with SPC. All education has been completed and the patient has no further questions; reports husband able to continue providing necessary assist at home. Acute PT is signing off. Thank you for this referral.     Recommendations for follow up therapy are one component of a multi-disciplinary discharge planning process, led by the attending physician.  Recommendations may be updated based on patient status, additional functional criteria and insurance authorization.  Follow Up Recommendations Home health PT    Assistance Recommended at Discharge Intermittent Supervision/Assistance  Patient can return home with the following  A lot of help with bathing/dressing/bathroom;Assistance with cooking/housework;Assist for transportation;Direct supervision/assist for medications management;Direct supervision/assist for financial management;Help with stairs or ramp for entrance;A lot of help with walking and/or transfers    Equipment Recommendations None recommended by PT  Recommendations for Other Services       Functional Status Assessment       Precautions / Restrictions Precautions Precautions: Fall Precaution  Comments: bilateral foot wounds - reports needing to wear boot on L foot to "protect wounds" Restrictions Weight Bearing Restrictions: No      Mobility  Bed Mobility               General bed mobility comments: Received sitting in recliner    Transfers Overall transfer level: Needs assistance Equipment used: 1 person hand held assist;Straight cane Transfers: Sit to/from Stand Sit to Stand: Mod assist           General transfer comment: Initial minA to stand from recliner, modA for trunk elevation on additional stand as pt with increasing fatigue; poor eccentric control into sitting    Ambulation/Gait Ambulation/Gait assistance: Min assist;Mod assist Gait Distance (Feet): 30 Feet (+ 30') Assistive device: Straight cane;1 person hand held assist Gait Pattern/deviations: Step-through pattern;Decreased stride length;Wide base of support;Trunk flexed Gait velocity: Decreased     General Gait Details: Slow, mildly unsteady gait with SPC and HHA for stability, initial minA via HHA, increasing to modA with fatigue; 1x seated rest break secondary to fatigue  Stairs            Wheelchair Mobility    Modified Rankin (Stroke Patients Only)       Balance Overall balance assessment: Needs assistance   Sitting balance-Leahy Scale: Fair     Standing balance support: Reliant on assistive device for balance Standing balance-Leahy Scale: Poor                               Pertinent Vitals/Pain Pain Assessment: Faces Faces Pain Scale: Hurts a little bit Pain Location: back Pain Descriptors / Indicators: Constant Pain Intervention(s): Limited activity within patient's tolerance    Home Living Family/patient expects to be discharged to:: Private residence Living  Arrangements: Spouse/significant other Available Help at Discharge: Family;Available PRN/intermittently Type of Home: Apartment Home Access: Level entry       Home Layout: One  level Home Equipment: Rolling Walker (2 wheels);BSC/3in1;Shower seat;Cane - single point;Wheelchair - manual Additional Comments: husband sets her up with BSC, food and phone while he is at work    Prior Function Prior Level of Function : Needs assist             Mobility Comments: reports walking to the bathroom to shower with husband's assist, otherwise transfers only from [facility] to w/c and recliner; stays in recliner majority of day while husband at work ADLs Comments: assisted for showering and LB dressing, all IADL, does not drive     Hand Dominance        Extremity/Trunk Assessment   Upper Extremity Assessment Upper Extremity Assessment: Generalized weakness    Lower Extremity Assessment Lower Extremity Assessment: Generalized weakness (chronic LE wounds, neuropathy)       Communication   Communication: No difficulties  Cognition Arousal/Alertness: Awake/alert Behavior During Therapy: Flat affect Overall Cognitive Status: No family/caregiver present to determine baseline cognitive functioning                                 General Comments: Pt following simple commands and answering questions appropriately; suspect slowed processing baseline        General Comments General comments (skin integrity, edema, etc.): SpO2 100% on RA, HR 60s    Exercises     Assessment/Plan    PT Assessment All further PT needs can be met in the next venue of care  PT Problem List Decreased strength;Decreased activity tolerance;Decreased balance;Decreased mobility       PT Treatment Interventions      PT Goals (Current goals can be found in the Care Plan section)  Acute Rehab PT Goals PT Goal Formulation: All assessment and education complete, DC therapy    Frequency       Co-evaluation               AM-PAC PT "6 Clicks" Mobility  Outcome Measure Help needed turning from your back to your side while in a flat bed without using bedrails?: A  Little Help needed moving from lying on your back to sitting on the side of a flat bed without using bedrails?: A Little Help needed moving to and from a bed to a chair (including a wheelchair)?: A Lot Help needed standing up from a chair using your arms (e.g., wheelchair or bedside chair)?: A Lot Help needed to walk in hospital room?: A Lot Help needed climbing 3-5 steps with a railing? : A Lot 6 Click Score: 14    End of Session   Activity Tolerance: Patient tolerated treatment well Patient left: in chair;with call bell/phone within reach Nurse Communication: Mobility status PT Visit Diagnosis: Other abnormalities of gait and mobility (R26.89)    Time: 1100-1117 PT Time Calculation (min) (ACUTE ONLY): 17 min   Charges:   PT Evaluation $PT Eval Moderate Complexity: 1 Mod        Mabeline Caras, PT, DPT Acute Rehabilitation Services  Pager [phone] Office [place] 12/24/2021, 11:33 AM

## 2021-12-24 NOTE — TOC Initial Note (Incomplete)
Transition of Care [facility]) - Initial/Assessment Note    Patient Details  Name: Nancy [name] MRN: [id number] Date of Birth: 1961/04/30  Transition of Care [facility]) CM/SW Contact:    Bethena Roys, RN Phone Number: 12/24/2021, 1:31 PM  Clinical Narrative: Case Manager was reviewing the patients chart and saw discharge orders. Patient is currently active with [facility] for RN/PT services. Patient will need resumption orders and F2F-Staff RN aware and secure chat sent to PA. [facility] is aware that the patient will transition home today. Patient has DME rolling walker and bedside commode in the home. No DME needs identified at this time. Husband to provide transportation home via private vehcixdeek             Expected Discharge Plan: [place] Barriers to Discharge: No Barriers Identified   Patient Goals and CMS Choice Patient states their goals for this hospitalization and ongoing recovery are:: to return home   Choice offered to / list presented to : Patient  Expected Discharge Plan and Services Expected Discharge Plan: [place] In-house Referral: NA Discharge Planning Services: CM Consult Post Acute Care Choice: Resumption of Svcs/PTA Provider, Home Health Living arrangements for the past 2 months: Apartment Expected Discharge Date: 12/24/21               DME Arranged: N/A DME Agency: NA       HH Arranged: RN, PT, Disease Management [place] Agency: [facility] Date [place] Agency Contacted: 12/24/21 Time [place]: [phone] Representative spoke with at [place]: [place]  Prior Living Arrangements/Services Living arrangements for the past 2 months: Apartment Lives with:: Spouse Patient language and need for interpreter reviewed:: Yes Do you feel safe going back to the place where you live?: Yes      Need for Family Participation in Patient Care: Yes (Comment) Care giver support system in place?: Yes  (comment) Current home services: DME (rolling walker and bedside commode in the home) Criminal Activity/Legal Involvement Pertinent to Current Situation/Hospitalization: No - Comment as needed  Activities of Daily Living Home Assistive Devices/Equipment: Environmental consultant (specify type), Cane (specify quad or straight), Wheelchair, CBG Meter ADL Screening (condition at time of admission) Patient's cognitive ability adequate to safely complete daily activities?: Yes Is the patient deaf or have difficulty hearing?: No Does the patient have difficulty seeing, even when wearing glasses/contacts?: No Does the patient have difficulty concentrating, remembering, or making decisions?: No Patient able to express need for assistance with ADLs?: Yes Does the patient have difficulty dressing or bathing?: Yes Independently performs ADLs?: Yes (appropriate for developmental age) Does the patient have difficulty walking or climbing stairs?: Yes Weakness of Legs: None Weakness of Arms/Hands: None  Permission Sought/Granted Permission sought to share information with : Facility Sport and exercise psychologist, Tourist information centre manager, Family Supports Permission granted to share information with : Yes, Verbal Permission Granted     Permission granted to share info w AGENCY: [facility]        Emotional Assessment Appearance:: Appears stated age Attitude/Demeanor/Rapport: Engaged Affect (typically observed): Appropriate Orientation: : Oriented to Situation, Oriented to  Time, Oriented to Place, Oriented to Self Alcohol / Substance Use: Not Applicable Psych Involvement: No (comment)  Admission diagnosis:  S/P arteriovenous (AV) fistula creation [J94.174] Patient Active Problem List   Diagnosis Date Noted   S/P arteriovenous (AV) fistula creation 12/22/2021   Critical limb ischemia of both lower extremities ([place]) 11/22/2021   Osteomyelitis ([place])  Peripheral artery disease ([place]) 10/27/2021   Acute exacerbation of congestive  heart failure ([place]) 10/24/2021   Acute cardiogenic pulmonary edema ([place]) 10/06/2021   Coronary artery disease involving native coronary artery of native heart without angina pectoris 10/06/2021   Goals of care, counseling/discussion    Gangrene of left foot ([place])    Cellulitis of left foot    Diabetic ulcer of left foot ([place]) 09/04/2021   CHF (congestive heart failure) ([place]) 07/07/2021   Normocytic anemia 06/30/2021   CKD (chronic kidney disease), stage IV ([place]) 06/11/2021   Acute on chronic diastolic CHF (congestive heart failure) ([place]) 06/10/2021   Abnormal nuclear stress test    Dyspnea on exertion 03/07/2021   Orthopnea 02/25/2021   History of cerebrovascular accident (CVA) with residual deficit 05/06/2020   Diabetic peripheral neuropathy ([place]) 04/26/2020   AKI (acute kidney injury) ([place]) 04/20/2020   Generalized weakness 04/20/2020   Dehydration 04/20/2020   Generalized abdominal pain    Pancreatitis, acute 02/29/2020   Cerebrovascular accident (CVA) ([place]) 11/08/2019   Stenosis of right carotid artery 11/08/2019   Mixed diabetic hyperlipidemia associated with type 2 diabetes mellitus ([place]) 11/08/2019   Cortical age-related cataract of both eyes 08/30/2019   Gait abnormality 08/30/2019   Statin declined 08/30/2019   Gastroesophageal reflux disease without esophagitis 04/18/2018   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   Type 2 diabetes mellitus with stage 4 chronic kidney disease, with long-term current use of insulin ([place]) 10/29/2017   History of macular degeneration 10/29/2017   Chronic cervical pain 10/29/2016   Myalgia 09/14/2016   Insomnia 09/14/2016   Tinea pedis of both feet 08/20/2016   Macular degeneration 08/17/2016   Low back pain 09/21/2014   Hypertensive urgency 09/21/2014   Diabetic gastroparesis associated with type 2 diabetes mellitus ([place]) 09/14/2014   Uncontrolled hypertension 09/08/2014   Thyroid nodule 08/17/2014   Obesity (BMI 30-39.9) 08/17/2014   Nausea &  vomiting 08/04/2014   Type II diabetes mellitus with neurological manifestations, uncontrolled 08/04/2014   PCP:  [name], MD Pharmacy:   [facility] [address] [place] [address] Phone: [phone] Fax: [place], [place] - [address] [place] [address] Phone: [phone] Fax: [phone]     Social Determinants of Health (SDOH) Interventions    Readmission Risk Interventions Readmission Risk Prevention Plan 09/21/2021 03/07/2020  Transportation Screening Complete Complete  PCP or Specialist Appt within 3-5 Days - Complete  HRI or [place] - (No Data)  Social Work Consult for [place] Planning/Counseling - Complete  Palliative Care Screening - Not Applicable  Medication Review Press photographer) Complete Complete  PCP or Specialist appointment within 3-5 days of discharge Complete -  [place] or Home Care Consult Complete -  SW Recovery Care/Counseling Consult Complete -  Palliative Care Screening Complete -  [place] Not Applicable -  Some recent data might be hidden

## 2021-12-24 NOTE — Consult Note (Signed)
° °  NAME:  RAMEEN GOHLKE, MRN:  [id number], DOB:  [birth year], LOS: 2 ADMISSION DATE:  12/22/2021, CONSULTATION DATE:  12/22/2021 REFERRING MD:  Dr. Caffie Damme, CHIEF COMPLAINT: Acute respiratory distress/vent management  History of Present Illness:  Nancy [name] is a 61 y.o. female with history of T2DM, diabetic neuropathy, HTN, CKD stage IV, HFpEF, CAD, PAD, carotid artery disease, HLD, anemia, IBS, CVA (2020) was admitted today for permanent dialysis access with vascular surgery.  Prior to surgery, patient desatted to the 70s 30 minutes after supra clavicle block.  Patient was transition to BiPAP but began to vomit so transition to nonrebreather mask and eventually switched to 2.5 L O2 [place] with O2 sats above 92%.  Patient continued to desat and was eventually intubated before surgery.  After her dialysis access, she was unable to be extubated so she was transferred to the PACU.   PCCM consulted respiratory distress/ventilator management  Pertinent  Medical History   former smoker, DM (with peripheral neuropathy, nephropathy), CKD (stage IV-V), CAD (non-obstructive 04/2021), chronic diastolic CHF (in setting of CKD and likely due to long-standing and poorly controlled HTN), murmur (no valvular stenosis or regurgitation 10/06/21 echo), dyspnea, carotid artery disease (right ICA occlusion, 40-59% left ICA 02/2021), anemia, HLD, IBS, gastroparesis, CVA (2020), thyroid nodule (2004), hard of hearing. TAH (10/22/02), cholecystectomy (03/05/20 for gallstone pancreatitis), obesity.  Significant Hospital Events: Including procedures, antibiotic start and stop dates in addition to other pertinent events   01/06: Permanent HD fistula 01/06: Intubated and PCCM consult 01/07: Diuresed and self extubated.    Interim History / Subjective:  Feels great on room air. Able to ambulate around unit at baseline level of function.   Objective   Blood pressure 122/64, pulse 69, temperature 98 F (36.7 C),  temperature source Oral, resp. rate 14, height 5\' 3"  (1.6 m), weight 107.2 kg, SpO2 98 %.        Intake/Output Summary (Last 24 hours) at 12/24/2021 1050 Last data filed at 12/24/2021 0900 Gross per 24 hour  Intake 362 ml  Output 1525 ml  Net -1163 ml    Filed Weights   12/20/21 1020 12/22/21 1115 12/24/21 0600  Weight: 109.8 kg 109.8 kg 107.2 kg    Examination: General: obese woman, in no distress.    HENT: moist mucous membranes.  Lungs: clear to auscultation.  Cardiovascular:  Regular rhythm.  S1, S2.  3+ BLE edema Abdomen: Soft.  Mildly distended.  Normal BS Extremities: Warm.  Edematous.  Feet ulcers wrapped with dressing. Neuro: awake with no focal deficits.    Ancillary tests personally reviewed:  Creatinine 4.21 Sodium 132  Assessment & Plan:  Acute hypoxic respiratory failure Acute on chronic diastolic HF CKD stage IV progressing to ESRD Diabetic nephropathy Uncontrolled T2DM Diabetic neuropathy Iron deficiency anemia  Plan:  - Discharge home on usual home medications  - Discussed with Vascular Surgery.   Best Practice (right click and "Reselect all SmartList Selections" daily)   Diet/type: Regular consistency (see orders) DVT prophylaxis: prophylactic heparin  GI prophylaxis: PPI Lines: N/A Foley:  N/A Code Status:  full code Last date of multidisciplinary goals of care discussion [spoke to husband at bedside]   Kipp Brood, MD [facility] ICU Physician [place]  Pager: [phone] Mobile: [phone] After hours: [phone].   12/24/2021, 10:50 AM

## 2021-12-24 NOTE — Discharge Instructions (Signed)
° °  [facility] ° °Discharge Instructions ° °AV Fistula or Graft Surgery for Dialysis Access ° °Please refer to the following instructions for your post-procedure care. Your surgeon or physician assistant will discuss any changes with you. ° °Activity ° °You may drive the day following your surgery, if you are comfortable and no longer taking prescription pain medication. Resume full activity as the soreness in your incision resolves. ° °Bathing/Showering ° °You may shower after you go home. Keep your incision dry for 48 hours. Do not soak in a bathtub, hot tub, or swim until the incision heals completely. You may not shower if you have a hemodialysis catheter. ° °Incision Care ° °Clean your incision with mild soap and water after 48 hours. Pat the area dry with a clean towel. You do not need a bandage unless otherwise instructed. Do not apply any ointments or creams to your incision. You may have skin glue on your incision. Do not peel it off. It will come off on its own in about one week. Your arm may swell a bit after surgery. To reduce swelling use pillows to elevate your arm so it is above your heart. Your doctor will tell you if you need to lightly wrap your arm with an ACE bandage. ° °Diet ° °Resume your normal diet. There are not special food restrictions following this procedure. In order to heal from your surgery, it is CRITICAL to get adequate nutrition. Your body requires vitamins, minerals, and protein. Vegetables are the best source of vitamins and minerals. Vegetables also provide the perfect balance of protein. Processed food has little nutritional value, so try to avoid this. ° °Medications ° °Resume taking all of your medications. If your incision is causing pain, you may take over-the counter pain relievers such as acetaminophen (Tylenol). If you were prescribed a stronger pain medication, please be aware these medications can cause nausea and constipation. Prevent  nausea by taking the medication with a snack or meal. Avoid constipation by drinking plenty of fluids and eating foods with high amount of fiber, such as fruits, vegetables, and grains. Do not take Tylenol if you are taking prescription pain medications. ° ° ° ° °Follow up °Your surgeon may want to see you in the office following your access surgery. If so, this will be arranged at the time of your surgery. ° °Please call us immediately for any of the following conditions: ° °Increased pain, redness, drainage (pus) from your incision site °Fever of 101 degrees or higher °Severe or worsening pain at your incision site °Hand pain or numbness. ° °Reduce your risk of vascular disease: ° °Stop smoking. If you would like help, call [facility] at 1-800-QUIT-NOW (1-800-784-8669) or [place] at 336-586-4000 ° °Manage your cholesterol °Maintain a desired weight °Control your diabetes °Keep your blood pressure down ° °Dialysis ° °It will take several weeks to several months for your new dialysis access to be ready for use. Your surgeon will determine when it is OK to use it. Your nephrologist will continue to direct your dialysis. You can continue to use your Permcath until your new access is ready for use. ° °If you have any questions, please call the office at [phone]. ° °

## 2021-12-24 NOTE — Progress Notes (Incomplete)
Discharge order placed this AM. AVS completed and printed. Medications and hospital course reviewed with patient. All questions answered. VS stable. No visible distress. Denies pain. Transported to lobby via wheelchair with husband at her side. No adverse events.

## 2021-12-24 NOTE — Plan of Care (Signed)
°  Problem: Clinical Measurements: Goal: Respiratory complications will improve Outcome: Progressing   Problem: Cardiovascular: Goal: Ability to achieve and maintain adequate cardiovascular perfusion will improve Outcome: Progressing Goal: Vascular access site(s) Level 0-1 will be maintained Outcome: Progressing   Problem: Pain Managment: Goal: General experience of comfort will improve Outcome: Progressing

## 2021-12-25 NOTE — Anesthesia Postprocedure Evaluation (Signed)
Anesthesia Post Note  Patient: Nancy [name]  Procedure(s) Performed: RIGHT ARM ARTERIOVENOUS (AV) BRACIOCPHELAIC FISTULA CREATION (Right: Arm Upper)     Patient location during evaluation: PACU Anesthesia Type: Regional and General Level of consciousness: sedated Pain management: pain level controlled Vital Signs Assessment: post-procedure vital signs reviewed and stable Respiratory status: patient remains intubated per anesthesia plan Cardiovascular status: blood pressure returned to baseline and stable Postop Assessment: no apparent nausea or vomiting Anesthetic complications: yes Comments: Phrenic involvement post supraclavicular block with SOB and inability to wean from vent support post procedure. Transferred to PACU intubated/sedated and CCM consulted for management. Patient also found to have pulmonary edema vs pneumonia which likely contributed to respiratory failure although patient did not endorse symptoms of either prior to initiation of anesthesia.    No notable events documented.  Last Vitals:  Vitals:   12/24/21 1200 12/24/21 1210  BP:  127/63  Pulse: 63   Resp: 15   Temp:    SpO2: 100%     Last Pain:  Vitals:   12/24/21 1200  TempSrc:   PainSc: 0-No pain                 Terralyn Matsumura

## 2021-12-27 ENCOUNTER — Telehealth (HOSPITAL_COMMUNITY): Payer: Self-pay

## 2021-12-27 ENCOUNTER — Other Ambulatory Visit (HOSPITAL_COMMUNITY): Payer: Self-pay

## 2021-12-27 NOTE — Telephone Encounter (Signed)
Mrs. Corriher inquiring about PCS services and the update of her application for same. I advised her I could reach out to Case Manager at [facility] for same. I will update patient once I hear back. Call complete.

## 2021-12-27 NOTE — Discharge Summary (Signed)
Discharge Summary  Patient ID: Nancy [name] [id number] 61 y.o. 1961-11-18  Admit date: 12/22/2021  Discharge date and time: 12/24/2021  2:15 PM   Admitting Physician: Waynetta Sandy, MD   Discharge Physician: same  Admission Diagnoses: S/P arteriovenous (AV) fistula creation [Z98.890]  Discharge Diagnoses: same  Admission Condition: fair  Discharged Condition: fair  Indication for Admission: post operative respiratory distress  Hospital Course: [name] is a 61 year old female who was brought in as an outpatient for right arm brachiocephalic fistula creation.  She tolerated the procedure well however postoperatively developed respiratory distress requiring intubation.  She was transferred to the ICU.  Critical care team was consulted for ventilation management.  POD #1 patient self extubated however did well on O2 by nasal cannula thereafter.  POD #2 she passed physical therapy evaluation and was ready for discharge home.  She will follow-up in office in 4 to 6 weeks with a right arm fistula duplex.  She was discharged home in stable condition.  Consults: pulmonary/intensive care  Treatments: surgery: Right brachiocephalic fistula creation by Dr. Donzetta Matters on 12/22/2021  Discharge Exam: See progress note 12/24/21 Vitals:   12/24/21 1200 12/24/21 1210  BP:  127/63  Pulse: 63   Resp: 15   Temp:    SpO2: 100%      Disposition: Discharge disposition: 01-Home or Self Care       Patient Instructions:  Allergies as of 12/24/2021       Reactions   Hydralazine Hcl Other (See Comments)   Hair loss   Hydrocodone Itching, Other (See Comments)   Upset stomach   Metformin And Related Nausea And Vomiting, Other (See Comments)   Stomach pains, also   Other Nausea Only, Other (See Comments)   Lettuce- Does not digest this!!   Plaquenil [hydroxychloroquine Sulfate] Hives   Shellfish-derived Products Nausea Only, Other (See Comments)   Caused an upset  stomach   Shrimp (diagnostic) Nausea Only, Other (See Comments)   Upset stomach    Sulfa Antibiotics Hives        Medication List     TAKE these medications    acetaminophen 500 MG tablet Commonly known as: TYLENOL Take 500 mg by mouth every 6 (six) hours as needed for moderate pain or headache.   amLODipine 10 MG tablet Commonly known as: NORVASC Take 1 tablet (10 mg total) by mouth daily.   Aspirin Low Dose 81 MG EC tablet Generic drug: aspirin Take 1 tablet (81 mg total) by mouth daily.   atorvastatin 40 MG tablet Commonly known as: LIPITOR Take 1 tablet (40 mg total) by mouth daily.   calcium acetate 667 MG capsule Commonly known as: PHOSLO Take 1 capsule (667 mg total) by mouth 3 (three) times daily with meals.   carvedilol 12.5 MG tablet Commonly known as: COREG Take 1.5 tablets (18.75 mg total) by mouth 2 (two) times daily with a meal.   Dexcom G6 Receiver Devi 1 Device by Does not apply route daily.   Dexcom G6 Sensor Misc 1 packet by Does not apply route daily.   Dexcom G6 Transmitter Misc 1 packet by Does not apply route daily.   ferrous sulfate 325 (65 FE) MG tablet Take 1 tablet (325 mg total) by mouth daily with breakfast.   glucose blood test strip Use as instructedCheck blood sugar three times daily. E11.40   insulin glargine 100 UNIT/ML Solostar Pen Commonly known as: LANTUS Inject 30 Units into the skin at bedtime.   insulin  lispro 100 UNIT/ML KwikPen Commonly known as: HumaLOG KwikPen Inject 4 units before breakfast and dinner.   Insulin Pen Needle 32G X 4 MM Misc Use to inject insulin as directed.   metolazone 2.5 MG tablet Commonly known as: ZAROXOLYN Take 1 tablet (2.5 mg total) by mouth once for 1 dose.   OneTouch Verio w/Device Kit Check blood sugar three times daily. E11.40   potassium chloride SA 20 MEQ tablet Commonly known as: KLOR-CON M Take 1 tablet (20 mEq total) by mouth once for 1 dose.   risperiDONE 3 MG  tablet Commonly known as: RISPERDAL TAKE 1 TABLET (3 MG TOTAL) BY MOUTH AT BEDTIME.   torsemide 20 MG tablet Commonly known as: DEMADEX Take 4 tablets (80 mg total) by mouth 2 (two) times daily.   traMADol 50 MG tablet Commonly known as: Ultram Take 1 tablet (50 mg total) by mouth every 6 (six) hours as needed.       Activity: activity as tolerated Diet: regular diet Wound Care: keep wound clean and dry  Follow-up with [facility] in 5 weeks.  Signed: Dagoberto Ligas, PA-C 12/27/2021 9:50 AM [facility] Office: [phone]

## 2021-12-27 NOTE — Progress Notes (Signed)
Paramedicine Encounter    Patient ID: Nancy [name], female    DOB: 1961/06/05, 61 y.o.   MRN: [id number]   Arrived for home visit for [name] who is alert and oriented reporting to be feeling okay. She denied shortness of breath, chest pain, dizziness or trouble taking her medications. I reviewed her chart from procedure and we went over follow up appointments coming up. I obtained assessment and vitals.   Lung sounds noted to have some wheezing in the upper lobes. She denied shortness of breath or cough. Lower legs noted to be swollen as her normal. Weight is up 4lbs this week.   I discussed her diet with her and she is still eating only fast food, drinking sodas regularly and eating salty snacks. We discussed the importance of a heart healthy diet. She verbalized understanding.    Dexcom was replaced.   Medications were reviewed and confirmed. Pill box filled accordingly. She will be seen in HF clinic on Friday.   Home visit complete.   Refills -Dexcom Sensors    Patient Care Team: [name], MD as PCP - General (Internal Medicine) [name], MD as PCP - Cardiology (Cardiology) Larey Dresser, MD as PCP - Advanced Heart Failure (Cardiology)  Patient Active Problem List   Diagnosis Date Noted   S/P arteriovenous (AV) fistula creation 12/22/2021   Critical limb ischemia of both lower extremities ([place]) 11/22/2021   Osteomyelitis ([place])    Peripheral artery disease ([place]) 10/27/2021   Acute exacerbation of congestive heart failure ([place]) 10/24/2021   Acute cardiogenic pulmonary edema ([place]) 10/06/2021   Coronary artery disease involving native coronary artery of native heart without angina pectoris 10/06/2021   Goals of care, counseling/discussion    Gangrene of left foot ([place])    Cellulitis of left foot    Diabetic ulcer of left foot ([place]) 09/04/2021   CHF (congestive heart failure) ([place]) 07/07/2021   Normocytic anemia 06/30/2021   CKD (chronic kidney  disease), stage IV ([place]) 06/11/2021   Acute on chronic diastolic CHF (congestive heart failure) ([place]) 06/10/2021   Abnormal nuclear stress test    Dyspnea on exertion 03/07/2021   Orthopnea 02/25/2021   History of cerebrovascular accident (CVA) with residual deficit 05/06/2020   Diabetic peripheral neuropathy ([place]) 04/26/2020   AKI (acute kidney injury) ([place]) 04/20/2020   Generalized weakness 04/20/2020   Dehydration 04/20/2020   Generalized abdominal pain    Pancreatitis, acute 02/29/2020   Cerebrovascular accident (CVA) ([place]) 11/08/2019   Stenosis of right carotid artery 11/08/2019   Mixed diabetic hyperlipidemia associated with type 2 diabetes mellitus ([place]) 11/08/2019   Cortical age-related cataract of both eyes 08/30/2019   Gait abnormality 08/30/2019   Statin declined 08/30/2019   Gastroesophageal reflux disease without esophagitis 04/18/2018   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   Type 2 diabetes mellitus with stage 4 chronic kidney disease, with long-term current use of insulin ([place]) 10/29/2017   History of macular degeneration 10/29/2017   Chronic cervical pain 10/29/2016   Myalgia 09/14/2016   Insomnia 09/14/2016   Tinea pedis of both feet 08/20/2016   Macular degeneration 08/17/2016   Low back pain 09/21/2014   Hypertensive urgency 09/21/2014   Diabetic gastroparesis associated with type 2 diabetes mellitus ([place]) 09/14/2014   Uncontrolled hypertension 09/08/2014   Thyroid nodule 08/17/2014   Obesity (BMI 30-39.9) 08/17/2014   Nausea & vomiting 08/04/2014   Type II diabetes mellitus with neurological manifestations, uncontrolled 08/04/2014    Current Outpatient Medications:    acetaminophen (  TYLENOL) 500 MG tablet, Take 500 mg by mouth every 6 (six) hours as needed for moderate pain or headache., Disp: , Rfl:    amLODipine (NORVASC) 10 MG tablet, Take 1 tablet (10 mg total) by mouth daily., Disp: 30 tablet, Rfl: 6   aspirin 81 MG EC tablet, Take 1 tablet (81 mg total) by mouth  daily., Disp: 60 tablet, Rfl: 1   atorvastatin (LIPITOR) 40 MG tablet, Take 1 tablet (40 mg total) by mouth daily., Disp: 30 tablet, Rfl: 6   Blood Glucose Monitoring Suppl (ONETOUCH VERIO) w/Device KIT, Check blood sugar three times daily. E11.40 (Patient not taking: Reported on 12/20/2021), Disp: 1 kit, Rfl: 0   calcium acetate (PHOSLO) 667 MG capsule, Take 1 capsule (667 mg total) by mouth 3 (three) times daily with meals., Disp: 90 capsule, Rfl: 3   carvedilol (COREG) 12.5 MG tablet, Take 1.5 tablets (18.75 mg total) by mouth 2 (two) times daily with a meal., Disp: 90 tablet, Rfl: 6   Continuous Blood Gluc Receiver (DEXCOM G6 RECEIVER) DEVI, 1 Device by Does not apply route daily., Disp: 1 each, Rfl: 0   Continuous Blood Gluc Sensor (DEXCOM G6 SENSOR) MISC, 1 packet by Does not apply route daily., Disp: 3 each, Rfl: 11   Continuous Blood Gluc Transmit (DEXCOM G6 TRANSMITTER) MISC, 1 packet by Does not apply route daily., Disp: 1 each, Rfl: 4   ferrous sulfate 325 (65 FE) MG tablet, Take 1 tablet (325 mg total) by mouth daily with breakfast., Disp: 100 tablet, Rfl: 1   glucose blood test strip, Use as instructedCheck blood sugar three times daily. E11.40 (Patient not taking: Reported on 12/20/2021), Disp: 100 each, Rfl: 12   insulin glargine (LANTUS) 100 UNIT/ML Solostar Pen, Inject 30 Units into the skin at bedtime., Disp: 15 mL, Rfl: 3   insulin lispro (HUMALOG KWIKPEN) 100 UNIT/ML KwikPen, Inject 4 units before breakfast and dinner., Disp: 15 mL, Rfl: 3   Insulin Pen Needle 32G X 4 MM MISC, Use to inject insulin as directed., Disp: 100 each, Rfl: 4   metolazone (ZAROXOLYN) 2.5 MG tablet, Take 1 tablet (2.5 mg total) by mouth once for 1 dose., Disp: 1 tablet, Rfl: 0   potassium chloride SA (KLOR-CON M) 20 MEQ tablet, Take 1 tablet (20 mEq total) by mouth once for 1 dose., Disp: 10 tablet, Rfl: 0   risperiDONE (RISPERDAL) 3 MG tablet, TAKE 1 TABLET (3 MG TOTAL) BY MOUTH AT BEDTIME., Disp: 30 tablet,  Rfl: 2   torsemide (DEMADEX) 20 MG tablet, Take 4 tablets (80 mg total) by mouth 2 (two) times daily., Disp: 150 tablet, Rfl: 6   traMADol (ULTRAM) 50 MG tablet, Take 1 tablet (50 mg total) by mouth every 6 (six) hours as needed., Disp: 6 tablet, Rfl: 0 Allergies  Allergen Reactions   Hydralazine Hcl Other (See Comments)    Hair loss   Hydrocodone Itching and Other (See Comments)    Upset stomach   Metformin And Related Nausea And Vomiting and Other (See Comments)    Stomach pains, also   Other Nausea Only and Other (See Comments)    Lettuce- Does not digest this!!   Plaquenil [Hydroxychloroquine Sulfate] Hives   Shellfish-Derived Products Nausea Only and Other (See Comments)    Caused an upset stomach   Shrimp (Diagnostic) Nausea Only and Other (See Comments)    Upset stomach    Sulfa Antibiotics Hives     Social History   Socioeconomic History   Marital status:  Legally Separated    Spouse name: [name]   Number of children: 1   Years of education: some colle   Highest education level: Not on file  Occupational History   Occupation: Employed FT as Landscape architect    Comment: [employer] , NA  Tobacco Use   Smoking status: Former    Packs/day: 0.25    Years: 0.50    Pack years: 0.13    Types: Cigarettes   Smokeless tobacco: Never   Tobacco comments:    Former smoker 11/03/21  Vaping Use   Vaping Use: Never used  Substance and Sexual Activity   Alcohol use: No   Drug use: No   Sexual activity: Yes    Birth control/protection: None  Other Topics Concern   Not on file  Social History Narrative   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    Social Determinants of Health   Financial Resource Strain: Low Risk    Difficulty of Paying Living Expenses: Not very hard  Food Insecurity: No Food Insecurity   Worried About Charity fundraiser in the Last Year: Never true   Ran Out of Food in the Last Year: Never true  Transportation Needs: No  Transportation Needs   Lack of Transportation (Medical): No   Lack of Transportation (Non-Medical): No  Physical Activity: Not on file  Stress: Not on file  Social Connections: Not on file  Intimate Partner Violence: Not on file    Physical Exam Vitals reviewed.  Constitutional:      Appearance: Normal appearance.  HENT:     Head: Normocephalic.     Nose: Nose normal.     Mouth/Throat:     Mouth: Mucous membranes are moist.     Pharynx: Oropharynx is clear.  Eyes:     Pupils: Pupils are equal, round, and reactive to light.  Cardiovascular:     Rate and Rhythm: Normal rate and regular rhythm.     Pulses: Normal pulses.     Heart sounds: Normal heart sounds.  Pulmonary:     Effort: Pulmonary effort is normal.     Breath sounds: Wheezing present.  Abdominal:     General: Abdomen is flat.     Palpations: Abdomen is soft.  Musculoskeletal:        General: Swelling present. Normal range of motion.     Cervical back: Normal range of motion.     Right lower leg: Edema present.     Left lower leg: Edema present.  Skin:    General: Skin is warm and dry.     Capillary Refill: Capillary refill takes less than 2 seconds.  Neurological:     General: No focal deficit present.     Mental Status: She is alert. Mental status is at baseline.  Psychiatric:        Mood and Affect: Mood normal.        Future Appointments  Date Time Provider [place]  12/29/2021  1:30 PM [facility] PA/NP [facility] None  01/02/2022 10:00 AM [place], Okey Regal, MD [facility]  01/05/2022 10:00 AM [facility] VASC 3 [facility]  01/05/2022 12:00 PM Larey Dresser, MD [facility] None  01/31/2022 11:00 AM [facility]ASC 2 - [facility]  01/31/2022 11:45 AM [facility] PA [facility]  05/18/2022 11:15 AM [name], MD [facility]     ACTION: Home visit completed

## 2021-12-29 ENCOUNTER — Encounter (HOSPITAL_COMMUNITY): Payer: Self-pay

## 2021-12-29 ENCOUNTER — Inpatient Hospital Stay: Payer: Medicaid Other | Admitting: Internal Medicine

## 2021-12-29 ENCOUNTER — Other Ambulatory Visit (HOSPITAL_COMMUNITY): Payer: Self-pay

## 2021-12-29 ENCOUNTER — Ambulatory Visit (HOSPITAL_COMMUNITY)
Admission: RE | Admit: 2021-12-29 | Discharge: 2021-12-29 | Disposition: A | Discharge status: Home / Self Care | Payer: Medicaid Other | Source: Ambulatory Visit | Attending: Family Medicine | Admitting: Family Medicine

## 2021-12-29 ENCOUNTER — Other Ambulatory Visit: Payer: Self-pay

## 2021-12-29 VITALS — BP 118/70 | HR 72 | Wt 249.0 lb

## 2021-12-29 DIAGNOSIS — I5033 Acute on chronic diastolic (congestive) heart failure: Secondary | ICD-10-CM | POA: Diagnosis present

## 2021-12-29 DIAGNOSIS — I10 Essential (primary) hypertension: Secondary | ICD-10-CM | POA: Diagnosis not present

## 2021-12-29 DIAGNOSIS — I5022 Chronic systolic (congestive) heart failure: Secondary | ICD-10-CM

## 2021-12-29 DIAGNOSIS — I6523 Occlusion and stenosis of bilateral carotid arteries: Secondary | ICD-10-CM

## 2021-12-29 DIAGNOSIS — Z7982 Long term (current) use of aspirin: Secondary | ICD-10-CM | POA: Diagnosis not present

## 2021-12-29 DIAGNOSIS — E785 Hyperlipidemia, unspecified: Secondary | ICD-10-CM | POA: Diagnosis not present

## 2021-12-29 DIAGNOSIS — N185 Chronic kidney disease, stage 5: Secondary | ICD-10-CM

## 2021-12-29 DIAGNOSIS — I2510 Atherosclerotic heart disease of native coronary artery without angina pectoris: Secondary | ICD-10-CM | POA: Diagnosis not present

## 2021-12-29 DIAGNOSIS — I272 Pulmonary hypertension, unspecified: Secondary | ICD-10-CM | POA: Insufficient documentation

## 2021-12-29 DIAGNOSIS — Z79899 Other long term (current) drug therapy: Secondary | ICD-10-CM | POA: Insufficient documentation

## 2021-12-29 DIAGNOSIS — Z8673 Personal history of transient ischemic attack (TIA), and cerebral infarction without residual deficits: Secondary | ICD-10-CM | POA: Insufficient documentation

## 2021-12-29 DIAGNOSIS — I132 Hypertensive heart and chronic kidney disease with heart failure and with stage 5 chronic kidney disease, or end stage renal disease: Secondary | ICD-10-CM | POA: Diagnosis not present

## 2021-12-29 DIAGNOSIS — E782 Mixed hyperlipidemia: Secondary | ICD-10-CM

## 2021-12-29 DIAGNOSIS — I739 Peripheral vascular disease, unspecified: Secondary | ICD-10-CM

## 2021-12-29 DIAGNOSIS — E1151 Type 2 diabetes mellitus with diabetic peripheral angiopathy without gangrene: Secondary | ICD-10-CM | POA: Insufficient documentation

## 2021-12-29 LAB — BASIC METABOLIC PANEL
Anion gap: 12 (ref 5–15)
BUN: 64 mg/dL — ABNORMAL HIGH (ref 8–23)
CO2: 27 mmol/L (ref 22–32)
Calcium: 8.9 mg/dL (ref 8.9–10.3)
Chloride: 99 mmol/L (ref 98–111)
Creatinine, Ser: 4.27 mg/dL — ABNORMAL HIGH (ref 0.44–1.00)
GFR, Estimated: 11 mL/min — ABNORMAL LOW (ref >60)
Glucose, Bld: 146 mg/dL — ABNORMAL HIGH (ref 70–99)
Potassium: 3.6 mmol/L (ref 3.5–5.1)
Sodium: 138 mmol/L (ref 135–145)

## 2021-12-29 MED ORDER — METOLAZONE 2.5 MG PO TABS
2.5000 mg | ORAL_TABLET | ORAL | 3 refills | Status: DC
  Filled 2021-12-29: qty 5, 35d supply, fill #0

## 2021-12-29 MED ORDER — UNNA-FLEX ELASTIC UNNA BOOT EX MISC: 1.0000 | CUTANEOUS | 0 refills | Status: DC

## 2021-12-29 NOTE — Addendum Note (Signed)
Encounter addended by: Rafael Bihari, FNP on: 12/29/2021 3:01 PM  Actions taken: Clinical Note Signed

## 2021-12-29 NOTE — Progress Notes (Signed)
Advanced Heart Failure Clinic Note   PCP: [name], MD PCP-Cardiologist: Quay Burow, MD  HF Cardiologist: Dr. Aundra Dubin  HPI: Nancy [name] 61 y.o. AAF w/ chronic diastolic heart failure, Stage IV CKD (baseline SCr ~2-3), HTN, T2DM, HLD, CVA, PVD (carotid artery duplex 3/22: occluded Rt ICA, mod Lt ICA stenosis) and tobacco abuse.   Echo 11/2019: EF 55-60%, RV normal. No LVH. No significant valvular dysfunction   She was referred to Dr. Gwenlyn Found for [facility] by PCP in April 2022. Complained of progressive exertional dyspnea, LEE and orthopnea. Echo 4/22: EF 60-65%, GIIDD, mild LVH, RV normal. NST w/ findings suggestive of possible anterior ischemia vs breast attenuation. R/LHC on 04/27/21 showed mild nonobstructive CAD and RHC hemodynamics c/w pulmonary HTN (see cath data below). Diuretics were increased w/ plans to refer to the [facility]. Dry wt felt to be ~228 lb.   Unfortunately, she has had 3 consecutive re-admissions for a/c dCHF since that time.   Admitted 5/24-5/27=> discharged home on Lasix 40 daily.   Admitted 6/25-6/30 =>SCr bump to 3.2 (2.9 at d/c). Discharged home on Torsemide 40 daily. Echo EF 55-60%, RV normal.   Admitted 07/07/21= > direct admit from Dr. Kennon Holter office w/ marked fluid overload, progressive wt gain, exertional dyspnea, LEE and orthopnea. Clinic BP elevated 172/85. SCr 2.6 on admit. Started on lasix gtt. AHF team consulted to assist with further management. She underwent RHC, suggests ongoing volume overload with elevated PCWP and RA.  Prominent v-waves in PCWP but think due to severe diastolic dysfunction as there is no significant MR on review of recent echo.  PYP study was not suggestive of TTR cardiac amyloidosis.  Suspect severe diastolic dysfunction due to long-standing and poorly controlled HTN. SCr and potassium continued to climb and nephrology consulted. Her diuretics were adjusted and she was able to be discharged on low dose carvedilol and torsemide 40  daily; weight 226 lbs.  Admitted 09/04/21 with a left heel ulcer secondary to diabetic neuropathy.  Work-up included right ABI of 0.45 and local ABI of 0.5.  She was started on antibiotics, no osteomyelitis on MRI. Vascular consulted and did not pursue angiography as it could potentially push her into renal failure and require initiation of HD. Ortho recommended left transtibial amputation  but she declined. Palliative care met with her but she did not want to discuss her health issues. There was no indication for HD, per nephrology, but she will likely need AV fistula and HD soon. She was not interested in this. PT recommended SNF however patient declined. She was discharged with Home Health (she is Medicaid pending, no insurance).   Admitted 10/22 w/ acute on chronic HFpEF exacerbation w/ acute pulmonary edema in setting of hypertensive urgency. Acute exacerbation felt triggered by poor med compliance and dietary indiscretion w/ sodium + high fluid intake. Echo EF 50-55%, mod LVH, G2DD. Course c/b worsening CKD. SCr bumped to 3.12 (prior baseline ~ 2.5). Nephrology was consulted but felt no emergent indication for HD. She responded well to IV Lasix, though nephrology anticipates that she may bee nearing need for HD soon. Plan is to follow in the outpatient setting. Also noted to have gangrenous pressure ulcers of bilateral heels left and right dorsal feet. Now followed by wound care. Discharged home on torsemide 60 mg daily. D/c SCr 3.12. D/c wt 231 lb.   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 She was diuresed with IV lasix and transitioned to po. GDMT limited by CKD  IV. Ortho surgery again recommended amputation, she continued to decline. She was also noted to have wound on right metatarsal, XR showed no osteomyelitis. She received bedside debridement and wound care to left foot. She was discharged home with plans for on-going wound care, weight 245 lbs.  Received  Lasix 80 mg IV + metolazone 2.5 at home for volume overload 12/13/21 via [facility]. No improvement in symptoms and she was advised to go to ED. Received further IV Lasix and felt better. Discharged home.   Admitted 12/22/21 for respiratory distress requiring intubation after her AVF creation surgery.  Today she returns for post hospitalization HF follow up with her husband. She is unable to bear weight on left leg but can get around the house some with the use of her cane. She is occasionally SOB with this, main issue continues to be fatigue and leg swelling. Denies  CP, dizziness, or PND/Orthopnea. Appetite ok. No fever or chills.Taking all medications. Her husband is doing her dressing changes to left foot daily. She is followed by [facility].   ReDs: 38%  ECG (personally reviewed):  NSR  ROS: All systems reviewed and negative except as per HPI.   Past Medical History:  Diagnosis Date   Anemia 2006   Carotid artery disease ([place])    right ICA occlusion, 66-29% LICA 03/7653 [place]   CHF (congestive heart failure) ([place])    Chronic kidney disease    Coronary artery disease    Depression 2014   previously on amitryptiline    Diabetes mellitus with neurological manifestation ([place]) 2006   Diabetic peripheral neuropathy ([place]) 04/26/2020   Dyspnea    Fracture of left ankle 1997   Gastroparesis 07/2016   Heart murmur    HOH (hard of hearing) 2004   Hyperlipidemia 2006   Hypertension 2006   IBS (irritable bowel syndrome) 2002   Leukopenia 2015   Macular degeneration 11/2019   Shingles 2009   Stroke [facility]) 2020   Thyroid nodule 2004   Current Outpatient Medications  Medication Sig Dispense Refill   acetaminophen (TYLENOL) 500 MG tablet Take 500 mg by mouth every 6 (six) hours as needed for moderate pain or headache.     amLODipine (NORVASC) 10 MG tablet Take 1 tablet (10 mg total) by mouth daily. 30 tablet 6   aspirin 81 MG EC tablet Take 1 tablet (81 mg total) by mouth daily. 60 tablet  1   atorvastatin (LIPITOR) 40 MG tablet Take 1 tablet (40 mg total) by mouth daily. 30 tablet 6   Blood Glucose Monitoring Suppl (ONETOUCH VERIO) w/Device KIT Check blood sugar three times daily. E11.40 1 kit 0   calcium acetate (PHOSLO) 667 MG capsule Take 1 capsule (667 mg total) by mouth 3 (three) times daily with meals. 90 capsule 3   carvedilol (COREG) 12.5 MG tablet Take 1.5 tablets (18.75 mg total) by mouth 2 (two) times daily with a meal. 90 tablet 6   Continuous Blood Gluc Receiver (DEXCOM G6 RECEIVER) DEVI 1 Device by Does not apply route daily. 1 each 0   Continuous Blood Gluc Sensor (DEXCOM G6 SENSOR) MISC 1 packet by Does not apply route daily. 3 each 11   Continuous Blood Gluc Transmit (DEXCOM G6 TRANSMITTER) MISC 1 packet by Does not apply route daily. 1 each 4   ferrous sulfate 325 (65 FE) MG tablet Take 1 tablet (325 mg total) by mouth daily with breakfast. 100 tablet 1   glucose blood test strip Use as instructedCheck blood sugar  three times daily. E11.40 100 each 12   insulin glargine (LANTUS) 100 UNIT/ML Solostar Pen Inject 30 Units into the skin at bedtime. 15 mL 3   insulin lispro (HUMALOG KWIKPEN) 100 UNIT/ML KwikPen Inject 4 units before breakfast and dinner. 15 mL 3   Insulin Pen Needle 32G X 4 MM MISC Use to inject insulin as directed. 100 each 4   risperiDONE (RISPERDAL) 3 MG tablet TAKE 1 TABLET (3 MG TOTAL) BY MOUTH AT BEDTIME. 30 tablet 2   torsemide (DEMADEX) 20 MG tablet Take 4 tablets (80 mg total) by mouth 2 (two) times daily. 150 tablet 6   No current facility-administered medications for this encounter.   Allergies  Allergen Reactions   Hydralazine Hcl Other (See Comments)    Hair loss   Hydrocodone Itching and Other (See Comments)    Upset stomach   Metformin And Related Nausea And Vomiting and Other (See Comments)    Stomach pains, also   Other Nausea Only and Other (See Comments)    Lettuce- Does not digest this!!   Plaquenil [Hydroxychloroquine  Sulfate] Hives   Shellfish-Derived Products Nausea Only and Other (See Comments)    Caused an upset stomach   Shrimp (Diagnostic) Nausea Only and Other (See Comments)    Upset stomach    Sulfa Antibiotics Hives   Social History   Socioeconomic History   Marital status: Legally Separated    Spouse name: [name]   Number of children: 1   Years of education: some colle   Highest education level: Not on file  Occupational History   Occupation: Employed FT as Landscape architect    Comment: [employer] , NA  Tobacco Use   Smoking status: Former    Packs/day: 0.25    Years: 0.50    Pack years: 0.13    Types: Cigarettes   Smokeless tobacco: Never   Tobacco comments:    Former smoker 11/03/21  Vaping Use   Vaping Use: Never used  Substance and Sexual Activity   Alcohol use: No   Drug use: No   Sexual activity: Yes    Birth control/protection: None  Other Topics Concern   Not on file  Social History Narrative   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    Social Determinants of Health   Financial Resource Strain: Low Risk    Difficulty of Paying Living Expenses: Not very hard  Food Insecurity: No Food Insecurity   Worried About Charity fundraiser in the Last Year: Never true   Ran Out of Food in the Last Year: Never true  Transportation Needs: No Transportation Needs   Lack of Transportation (Medical): No   Lack of Transportation (Non-Medical): No  Physical Activity: Not on file  Stress: Not on file  Social Connections: Not on file  Intimate Partner Violence: Not on file   Family History  Problem Relation Age of Onset   Hypertension Mother    Heart disease Mother    Diabetes Mother    Thyroid disease Mother    Congestive Heart Failure Mother    Breast cancer Maternal Grandmother    Colon cancer Maternal Grandfather    Heart attack Sister    Heart disease Brother    Hyperlipidemia Brother    Hypertension Brother    Diabetes Father     Breast cancer Maternal Aunt    BP 118/70    Pulse 72    Wt 112.9 kg (249 lb)  SpO2 98%    BMI 44.11 kg/m   Wt Readings from Last 3 Encounters:  12/29/21 112.9 kg (249 lb)  12/27/21 108.9 kg (240 lb)  12/24/21 107.2 kg (236 lb 5.3 oz)   General:  NAD. No resp difficulty, arrived in [facility], fatigued-appearing. HEENT: Normal Neck: Supple. JVP 6-7. Carotids 2+ bilat; no bruits. No lymphadenopathy or thryomegaly appreciated. Cor: PMI nondisplaced. Regular rate & rhythm. No rubs, gallops or murmurs. Lungs: Clear Abdomen: Obese, nontender, nondistended. No hepatosplenomegaly. No bruits or masses. Good bowel sounds. Extremities: No cyanosis, clubbing, rash, 2-3+ BLE edema, R>L L ortho shoe on Neuro: Alert & oriented x 3, cranial nerves grossly intact. Moves all 4 extremities w/o difficulty. Flat affect.  ASSESSMENT & PLAN: 1. Acute on chronic diastolic CHF: Echo in 9/93 showed EF 55-60%, moderate LVH, normal RV, dilated IVC.  R/LHC in 5/22 showed nonobstructive CAD, pulmonary hypertension .  Multiple admissions recently with CHF, complicated by CKD stage IV.  RHC on 7/29 suggested ongoing volume overload with elevated PCWP and RA.  Prominent v-waves in PCWP but think due to severe diastolic dysfunction as there is no significant MR on review of recent echo.  PYP study was not suggestive of TTR cardiac amyloidosis.  Suspect severe diastolic dysfunction due to long-standing and poorly controlled HTN.  Admit for a/c CHF 10/22 & 11/22 w/ fluid overload, c/b CKD. NYHA III, functional class difficult due to LLE wound and deconditioning. She is volume overloaded today, ReDs 38%. GDMT limited by CKD IV. - Add weekly metolazone 2.5 mg on Fridays. Take a dose today with her torsemide when she gets home. May ultimately need twice weekly dosing. - Elevate right leg. Not sure that she could get a light- compression hose on the right leg, but no UNNA boots with PAD.  - Continue torsemide 80 mg bid. - Discussed  importance of daily weights, limiting fluid to less than 2L/day and limiting high-sodium foods.  - Continue with paramedicine. - Have had discussions regarding [facility]. Unfortunately, she is currently not a candidate as she has no insurance. Suspect she is also likely nearing HD.  2. CAD: LHC on 5/22, she has mild to moderate segmental proximal mid and distal LAD disease. Her circumflex and RCA do not have significant disease. - Stable w/o CP. - Continue ASA + statin. 3. CKD stage V: Renal US with no evidence of obstruction. Followed by nephrology. Now s/p AVF creation. She needs follow up with renal soon. 4. PAD: Now with bilateral gangrenous pressure ulcers, L >R. Evaluated by vascular and orthopedic surgery during recent admissions, recommend transtibial amputation however patient declined this, opting for non-surgical healing.  - Seen at [facility], wounds healing per patient and husband. - ABIs (9/22): ~0.5 bilaterally. Repeat ABIs scheduled for next week. Dr. Gwenlyn Found considering angiography and potential intervention for limb salvage, however hesitant as this would likely hasten need for dialysis. - Continue aspirin + statin. - Continue offloading heels. 5. HLD: On atorvastatin 40. LDL 99, HDL 52 (7/22). 6. HTN: Well-controlled. No med adjustments today. Avoid aggressive BP lowing given CKD. - Continue carvedilol 18.75 mg bid. - Continue amlodipine 10 mg daily. 7. Carotid disease: Occluded R ICA, moderate L ICA. - Continue ASA, statin.  Follow up with Dr. Aundra Dubin as scheduled next week.  Allena Katz, FNP-BC 12/29/21

## 2021-12-29 NOTE — Progress Notes (Signed)
ReDS Vest / Clip - 12/29/21 1342       ReDS Vest / Clip   Station Marker B    Ruler Value 35    ReDS Value Range Moderate volume overload    ReDS Actual Value 38

## 2021-12-29 NOTE — Patient Instructions (Addendum)
START Metolazone 2.5 mg one tab weekly on  Fridays  Labs today We will only contact you if something comes back abnormal or we need to make some changes. Otherwise no news is good news!  Be sure to keep vascular appointment  Elevate your legs  Keep cardiology follow up as scheduled with Dr Aundra Dubin  Do the following things EVERYDAY: Weigh yourself in the morning before breakfast. Write it down and keep it in a log. Take your medicines as prescribed Eat low salt foods--Limit salt (sodium) to 2000 mg per day.  Stay as active as you can everyday Limit all fluids for the day to less than 2 liters  At the [facility], you and your health needs are our priority. As part of our continuing mission to provide you with exceptional heart care, we have created designated Provider Care Teams. These Care Teams include your primary Cardiologist (physician) and Advanced Practice Providers (APPs- Physician Assistants and Nurse Practitioners) who all work together to provide you with the care you need, when you need it.   You may see any of the following providers on your designated Care Team at your next follow up: Dr Glori Bickers Dr Haynes Kerns, NP Lyda Jester, [place] [facility] [place], [place] Audry Riles, PharmD   Please be sure to bring in all your medications bottles to every appointment.   If you have any questions or concerns before your next appointment please send [place] a message through [place] or call our office at [phone].    TO LEAVE A MESSAGE FOR THE NURSE SELECT OPTION 2, PLEASE LEAVE A MESSAGE INCLUDING: YOUR NAME DATE OF BIRTH CALL BACK NUMBER REASON FOR CALL**this is important as we prioritize the call backs  YOU WILL RECEIVE A CALL BACK THE SAME DAY AS LONG AS YOU CALL BEFORE 4:00 PM

## 2022-01-02 ENCOUNTER — Encounter: Payer: Self-pay | Admitting: Internal Medicine

## 2022-01-02 ENCOUNTER — Other Ambulatory Visit: Payer: Self-pay

## 2022-01-02 ENCOUNTER — Ambulatory Visit (INDEPENDENT_AMBULATORY_CARE_PROVIDER_SITE_OTHER): Payer: Medicaid Other | Admitting: Internal Medicine

## 2022-01-02 ENCOUNTER — Telehealth: Payer: Self-pay

## 2022-01-02 VITALS — BP 157/83 | HR 75 | Temp 98.2°F

## 2022-01-02 DIAGNOSIS — M86672 Other chronic osteomyelitis, left ankle and foot: Secondary | ICD-10-CM | POA: Diagnosis present

## 2022-01-02 DIAGNOSIS — I70223 Atherosclerosis of native arteries of extremities with rest pain, bilateral legs: Secondary | ICD-10-CM

## 2022-01-02 DIAGNOSIS — E11621 Type 2 diabetes mellitus with foot ulcer: Secondary | ICD-10-CM | POA: Diagnosis not present

## 2022-01-02 DIAGNOSIS — L97426 Non-pressure chronic ulcer of left heel and midfoot with bone involvement without evidence of necrosis: Secondary | ICD-10-CM | POA: Diagnosis not present

## 2022-01-02 NOTE — Telephone Encounter (Signed)
Call placed to [place] check on status of PCS referral.  Spoke to [facility] who confirmed that the referral has been received and they are ready to schedule the patient for an in home assessment. She said that they have tried to reach the patient at # [phone] multiple times and have left messages requesting her to call back. They tried the number for Nancy [name] which is listed on the referral form but is now different from his number in [place].  Provided Nancy [name] with his current number [phone]. She said she will make note of this change in their system and scheduling may be reaching out to her again. However, she said it would be best for the patient  or Nancy [name] to call [employer] to schedule the assessment.  [place] # [phone] or [phone].   This CM called the numbers for patient and Nancy [name] to inform them of information obtained from [place].  Left messages on each number requesting a call back from patient

## 2022-01-02 NOTE — Assessment & Plan Note (Signed)
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  Treatment though is surgical which she has refused and no benefit from continuing antibiotics which she has been treated with in the past.  She will continue with wound care and her and her husband are hopeful for resolution.   She can follow up here as needed.

## 2022-01-02 NOTE — Progress Notes (Signed)
° °  Subjective:    Patient ID: Nancy [name], female    DOB: [birth year], 61 y.o.   MRN: [id number]  HPI Here for hsfu She has a history of ESRD, PAD and chronic calcaneal osteomyelitis.  She was seen by orthopedics for the heel initially in September 2022 with a gangrenous ulcer of the heel pad and advised she would need amputation as her foot was not salvageable, which she refused.  She was discharged on oral antibiotics for 3 weeks.  She returned to the hospital in October with a CHF exacerbation and seen by ID and again orthopedics with the same recommendation of amputation, which she again refused.  She was hospitalized after getting her fistula placed due to respiratory failure requiring intubation.  She is here now for follow up of the ulcer.  The husband gives much of the history and stresses that he feels there is great improvement in the ulcer and shrinking overall.  He is very pleased with progress and she continues to follow with Dr. Dellia Nims.     Review of Systems  Constitutional:  Negative for chills and fever.  Gastrointestinal:  Negative for diarrhea and nausea.  Skin:  Negative for rash.      Objective:   Physical Exam Eyes:     General: No scleral icterus. Pulmonary:     Effort: Pulmonary effort is normal.  Skin:    Findings: No rash.  Neurological:     Mental Status: She is alert.  Psychiatric:        Mood and Affect: Mood normal.    SH: no tobacco      Assessment & Plan:

## 2022-01-02 NOTE — Assessment & Plan Note (Signed)
Poor blood flow with known critical limb ischemia.  There was discussion of angiography and possible intervention but does not appear that has been done.  Poor chance of healing overall.  She will continue with wound care.

## 2022-01-02 NOTE — Assessment & Plan Note (Signed)
She will need to continue with wound care and good sugar management

## 2022-01-03 ENCOUNTER — Other Ambulatory Visit (HOSPITAL_COMMUNITY): Payer: Self-pay

## 2022-01-03 ENCOUNTER — Telehealth (HOSPITAL_COMMUNITY): Payer: Self-pay | Admitting: Surgery

## 2022-01-03 MED ORDER — POTASSIUM CHLORIDE CRYS ER 20 MEQ PO TBCR
40.0000 meq | EXTENDED_RELEASE_TABLET | ORAL | 0 refills | Status: DC
  Filled 2022-01-03: qty 8, 28d supply, fill #0

## 2022-01-03 NOTE — Telephone Encounter (Signed)
-----   Message from Rafael Bihari, [place] sent at 12/29/2021  4:23 PM EST ----- Labs stable. Please take 40 KCL today with metolazone dose.

## 2022-01-03 NOTE — Progress Notes (Signed)
Paramedicine Encounter    Patient ID: Nancy [name], female    DOB: 1961/08/10, 61 y.o.   MRN: [id number]  Pt here with her husband, pt denies any complaints.  She has a ortho type boot on her left foot, husband said the velcro has became useless so he has taped duct tape around the boot to keep it on, I advised him this was not a good idea and he needed to go to medical supply store to get another one ASAP. Luckily it is not tight or close to constricting any blood flow for her foot, its just on there enough to keep the boot from coming off when she ambulates.   I relayed the [facility] number to husband so they can call to set those services up.  Meds verified and pill box refilled.    Refills needed: Ferrous sulfate   BP (!) 160/84    Pulse 72    Resp 18    Wt 232 lb (105.2 kg)    SpO2 98%    BMI 41.10 kg/m  Weight yesterday-? Last visit weight-249 @ clinic   Patient Care Team: [name], MD as PCP - General (Internal Medicine) [name], MD as PCP - Cardiology (Cardiology) Larey Dresser, MD as PCP - Advanced Heart Failure (Cardiology)  Patient Active Problem List   Diagnosis Date Noted   S/P arteriovenous (AV) fistula creation 12/22/2021   Critical limb ischemia of both lower extremities ([place]) 11/22/2021   Osteomyelitis ([place])    Peripheral artery disease ([place]) 10/27/2021   Acute exacerbation of congestive heart failure ([place]) 10/24/2021   Acute cardiogenic pulmonary edema ([place]) 10/06/2021   Coronary artery disease involving native coronary artery of native heart without angina pectoris 10/06/2021   Goals of care, counseling/discussion    Gangrene of left foot ([place])    Cellulitis of left foot    Diabetic ulcer of left foot ([place]) 09/04/2021   CHF (congestive heart failure) ([place]) 07/07/2021   Normocytic anemia 06/30/2021   CKD (chronic kidney disease), stage IV ([place]) 06/11/2021   Acute on chronic diastolic CHF (congestive heart failure) ([place])  06/10/2021   Abnormal nuclear stress test    Dyspnea on exertion 03/07/2021   Orthopnea 02/25/2021   History of cerebrovascular accident (CVA) with residual deficit 05/06/2020   Diabetic peripheral neuropathy ([place]) 04/26/2020   AKI (acute kidney injury) ([place]) 04/20/2020   Generalized weakness 04/20/2020   Dehydration 04/20/2020   Generalized abdominal pain    Pancreatitis, acute 02/29/2020   Cerebrovascular accident (CVA) ([place]) 11/08/2019   Stenosis of right carotid artery 11/08/2019   Mixed diabetic hyperlipidemia associated with type 2 diabetes mellitus ([place]) 11/08/2019   Cortical age-related cataract of both eyes 08/30/2019   Gait abnormality 08/30/2019   Statin declined 08/30/2019   Gastroesophageal reflux disease without esophagitis 04/18/2018   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   Type 2 diabetes mellitus with stage 4 chronic kidney disease, with long-term current use of insulin ([place]) 10/29/2017   History of macular degeneration 10/29/2017   Chronic cervical pain 10/29/2016   Myalgia 09/14/2016   Insomnia 09/14/2016   Tinea pedis of both feet 08/20/2016   Macular degeneration 08/17/2016   Low back pain 09/21/2014   Hypertensive urgency 09/21/2014   Diabetic gastroparesis associated with type 2 diabetes mellitus ([place]) 09/14/2014   Uncontrolled hypertension 09/08/2014   Thyroid nodule 08/17/2014   Obesity (BMI 30-39.9) 08/17/2014   Nausea & vomiting 08/04/2014   Type II diabetes mellitus with neurological manifestations, uncontrolled  08/04/2014    Current Outpatient Medications:    acetaminophen (TYLENOL) 500 MG tablet, Take 500 mg by mouth every 6 (six) hours as needed for moderate pain or headache., Disp: , Rfl:    amLODipine (NORVASC) 10 MG tablet, Take 1 tablet (10 mg total) by mouth daily., Disp: 30 tablet, Rfl: 6   aspirin 81 MG EC tablet, Take 1 tablet (81 mg total) by mouth daily., Disp: 60 tablet, Rfl: 1   atorvastatin (LIPITOR) 40 MG tablet, Take 1 tablet (40 mg total) by  mouth daily. (Patient taking differently: Take 40 mg by mouth every evening.), Disp: 30 tablet, Rfl: 6   Blood Glucose Monitoring Suppl (ONETOUCH VERIO) w/Device KIT, Check blood sugar three times daily. E11.40, Disp: 1 kit, Rfl: 0   calcium acetate (PHOSLO) 667 MG capsule, Take 1 capsule (667 mg total) by mouth 3 (three) times daily with meals., Disp: 90 capsule, Rfl: 3   carvedilol (COREG) 12.5 MG tablet, Take 1.5 tablets (18.75 mg total) by mouth 2 (two) times daily with a meal., Disp: 90 tablet, Rfl: 6   Continuous Blood Gluc Receiver (DEXCOM G6 RECEIVER) DEVI, 1 Device by Does not apply route daily., Disp: 1 each, Rfl: 0   Continuous Blood Gluc Sensor (DEXCOM G6 SENSOR) MISC, 1 packet by Does not apply route daily., Disp: 3 each, Rfl: 11   Continuous Blood Gluc Transmit (DEXCOM G6 TRANSMITTER) MISC, 1 packet by Does not apply route daily., Disp: 1 each, Rfl: 4   ferrous sulfate 325 (65 FE) MG tablet, Take 1 tablet (325 mg total) by mouth daily with breakfast., Disp: 100 tablet, Rfl: 1   glucose blood test strip, Use as instructedCheck blood sugar three times daily. E11.40, Disp: 100 each, Rfl: 12   insulin glargine (LANTUS) 100 UNIT/ML Solostar Pen, Inject 30 Units into the skin at bedtime., Disp: 15 mL, Rfl: 3   insulin lispro (HUMALOG KWIKPEN) 100 UNIT/ML KwikPen, Inject 4 units before breakfast and dinner., Disp: 15 mL, Rfl: 3   Insulin Pen Needle 32G X 4 MM MISC, Use to inject insulin as directed., Disp: 100 each, Rfl: 4   metolazone (ZAROXOLYN) 2.5 MG tablet, Take 1 tablet (2.5 mg total) by mouth once a week, every Friday., Disp: 5 tablet, Rfl: 3   risperiDONE (RISPERDAL) 3 MG tablet, TAKE 1 TABLET (3 MG TOTAL) BY MOUTH AT BEDTIME., Disp: 30 tablet, Rfl: 2   torsemide (DEMADEX) 20 MG tablet, Take 4 tablets (80 mg total) by mouth 2 (two) times daily., Disp: 150 tablet, Rfl: 6   potassium chloride SA (KLOR-CON M) 20 MEQ tablet, Take 2 tablets (40 mEq total) by mouth once a week with weekly  metolazone dose, Disp: 30 tablet, Rfl: 0 Allergies  Allergen Reactions   Hydralazine Hcl Other (See Comments)    Hair loss   Hydrocodone Itching and Other (See Comments)    Upset stomach   Metformin And Related Nausea And Vomiting and Other (See Comments)    Stomach pains, also   Other Nausea Only and Other (See Comments)    Lettuce- Does not digest this!!   Plaquenil [Hydroxychloroquine Sulfate] Hives   Shellfish-Derived Products Nausea Only and Other (See Comments)    Caused an upset stomach   Shrimp (Diagnostic) Nausea Only and Other (See Comments)    Upset stomach    Sulfa Antibiotics Hives      Social History   Socioeconomic History   Marital status: Legally Separated    Spouse name: [name]   Number of  children: 1   Years of education: some colle   Highest education level: Not on file  Occupational History   Occupation: Employed FT as Landscape architect    Comment: [employer] , NA  Tobacco Use   Smoking status: Former    Packs/day: 0.25    Years: 0.50    Pack years: 0.13    Types: Cigarettes   Smokeless tobacco: Never   Tobacco comments:    Former smoker 11/03/21  Vaping Use   Vaping Use: Never used  Substance and Sexual Activity   Alcohol use: No   Drug use: No   Sexual activity: Yes    Birth control/protection: None  Other Topics Concern   Not on file  Social History Narrative   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    Social Determinants of Health   Financial Resource Strain: Low Risk    Difficulty of Paying Living Expenses: Not very hard  Food Insecurity: No Food Insecurity   Worried About Charity fundraiser in the Last Year: Never true   Ran Out of Food in the Last Year: Never true  Transportation Needs: No Transportation Needs   Lack of Transportation (Medical): No   Lack of Transportation (Non-Medical): No  Physical Activity: Not on file  Stress: Not on file  Social Connections: Not on file  Intimate Partner  Violence: Not on file    Physical Exam      Future Appointments  Date Time Provider [place]  01/05/2022 10:00 AM MC-CV NL VASC 3 MC[facility]SECVI [facility]  01/05/2022 12:00 PM Larey Dresser, MD MC-HVSC None  01/31/2022 11:00 AM MC-CV HS VASC 2 - [facility] MC-HCVI VVS  01/31/2022 11:45 AM [facility] PA [facility] VVS  05/18/2022 11:15 AM [name], MD [facility] [place], [place] [facility] Paramedic  01/03/22

## 2022-01-03 NOTE — Telephone Encounter (Signed)
Patient called regarding lab results and recommendations per Allena Katz NP. She is aware and agreeable.  I will update the medication list in [facility].

## 2022-01-05 ENCOUNTER — Other Ambulatory Visit: Payer: Self-pay

## 2022-01-05 ENCOUNTER — Encounter (HOSPITAL_COMMUNITY): Payer: Self-pay | Admitting: Cardiology

## 2022-01-05 ENCOUNTER — Ambulatory Visit (HOSPITAL_BASED_OUTPATIENT_CLINIC_OR_DEPARTMENT_OTHER)
Admission: RE | Admit: 2022-01-05 | Discharge: 2022-01-05 | Disposition: A | Discharge status: Home / Self Care | Payer: Medicaid Other | Source: Ambulatory Visit | Attending: Cardiovascular Disease | Admitting: Cardiovascular Disease

## 2022-01-05 ENCOUNTER — Other Ambulatory Visit (HOSPITAL_COMMUNITY): Payer: Self-pay

## 2022-01-05 ENCOUNTER — Ambulatory Visit (HOSPITAL_COMMUNITY)
Admission: RE | Admit: 2022-01-05 | Discharge: 2022-01-05 | Disposition: A | Discharge status: Home / Self Care | Payer: Medicaid Other | Source: Ambulatory Visit | Attending: Cardiology | Admitting: Cardiology

## 2022-01-05 VITALS — BP 140/70 | HR 72 | Wt 244.4 lb

## 2022-01-05 DIAGNOSIS — E1140 Type 2 diabetes mellitus with diabetic neuropathy, unspecified: Secondary | ICD-10-CM | POA: Insufficient documentation

## 2022-01-05 DIAGNOSIS — E1152 Type 2 diabetes mellitus with diabetic peripheral angiopathy with gangrene: Secondary | ICD-10-CM | POA: Insufficient documentation

## 2022-01-05 DIAGNOSIS — E785 Hyperlipidemia, unspecified: Secondary | ICD-10-CM | POA: Diagnosis not present

## 2022-01-05 DIAGNOSIS — I739 Peripheral vascular disease, unspecified: Secondary | ICD-10-CM

## 2022-01-05 DIAGNOSIS — I5022 Chronic systolic (congestive) heart failure: Secondary | ICD-10-CM | POA: Insufficient documentation

## 2022-01-05 DIAGNOSIS — Z79899 Other long term (current) drug therapy: Secondary | ICD-10-CM | POA: Diagnosis not present

## 2022-01-05 DIAGNOSIS — I272 Pulmonary hypertension, unspecified: Secondary | ICD-10-CM | POA: Diagnosis not present

## 2022-01-05 DIAGNOSIS — I6523 Occlusion and stenosis of bilateral carotid arteries: Secondary | ICD-10-CM | POA: Insufficient documentation

## 2022-01-05 DIAGNOSIS — E1122 Type 2 diabetes mellitus with diabetic chronic kidney disease: Secondary | ICD-10-CM | POA: Diagnosis not present

## 2022-01-05 DIAGNOSIS — I2510 Atherosclerotic heart disease of native coronary artery without angina pectoris: Secondary | ICD-10-CM | POA: Insufficient documentation

## 2022-01-05 DIAGNOSIS — Z8673 Personal history of transient ischemic attack (TIA), and cerebral infarction without residual deficits: Secondary | ICD-10-CM | POA: Insufficient documentation

## 2022-01-05 DIAGNOSIS — N184 Chronic kidney disease, stage 4 (severe): Secondary | ICD-10-CM | POA: Diagnosis not present

## 2022-01-05 DIAGNOSIS — Z87891 Personal history of nicotine dependence: Secondary | ICD-10-CM | POA: Diagnosis not present

## 2022-01-05 DIAGNOSIS — E1151 Type 2 diabetes mellitus with diabetic peripheral angiopathy without gangrene: Secondary | ICD-10-CM | POA: Insufficient documentation

## 2022-01-05 DIAGNOSIS — I130 Hypertensive heart and chronic kidney disease with heart failure and stage 1 through stage 4 chronic kidney disease, or unspecified chronic kidney disease: Secondary | ICD-10-CM | POA: Insufficient documentation

## 2022-01-05 DIAGNOSIS — I5021 Acute systolic (congestive) heart failure: Secondary | ICD-10-CM

## 2022-01-05 DIAGNOSIS — Z7982 Long term (current) use of aspirin: Secondary | ICD-10-CM | POA: Diagnosis not present

## 2022-01-05 LAB — BASIC METABOLIC PANEL
Anion gap: 14 (ref 5–15)
BUN: 64 mg/dL — ABNORMAL HIGH (ref 8–23)
CO2: 24 mmol/L (ref 22–32)
Calcium: 9.2 mg/dL (ref 8.9–10.3)
Chloride: 98 mmol/L (ref 98–111)
Creatinine, Ser: 4.76 mg/dL — ABNORMAL HIGH (ref 0.44–1.00)
GFR, Estimated: 10 mL/min — ABNORMAL LOW (ref >60)
Glucose, Bld: 119 mg/dL — ABNORMAL HIGH (ref 70–99)
Potassium: 3.8 mmol/L (ref 3.5–5.1)
Sodium: 136 mmol/L (ref 135–145)

## 2022-01-05 LAB — LIPID PANEL
Cholesterol: 160 mg/dL (ref 0–200)
HDL: 43 mg/dL (ref >40)
LDL Cholesterol: 102 mg/dL — ABNORMAL HIGH (ref 0–99)
Total CHOL/HDL Ratio: 3.7 RATIO
Triglycerides: 77 mg/dL (ref <150)
VLDL: 15 mg/dL (ref 0–40)

## 2022-01-05 LAB — BRAIN NATRIURETIC PEPTIDE: B Natriuretic Peptide: 500.5 pg/mL — ABNORMAL HIGH (ref 0.0–100.0)

## 2022-01-05 MED ORDER — TORSEMIDE 20 MG PO TABS
100.0000 mg | ORAL_TABLET | Freq: Two times a day (BID) | ORAL | 6 refills | Status: DC
  Filled 2022-01-05: qty 150, 15d supply, fill #0

## 2022-01-05 MED ORDER — METOLAZONE 2.5 MG PO TABS
2.5000 mg | ORAL_TABLET | ORAL | 3 refills | Status: DC
  Filled 2022-01-05: qty 5, 17d supply, fill #0

## 2022-01-05 MED ORDER — POTASSIUM CHLORIDE CRYS ER 20 MEQ PO TBCR
40.0000 meq | EXTENDED_RELEASE_TABLET | ORAL | 0 refills | Status: DC
  Filled 2022-01-05: qty 30, 52d supply, fill #0

## 2022-01-05 NOTE — Patient Instructions (Signed)
Medication Changes:  Take Metolazone every Monday & Friday.  Take Potassium every Monday & Friday.  Increase Torsemide to 100mg  ( 5 Tab) Twice daily   Lab Work:  Labs done today, your results will be available in MyChart, we will contact you for abnormal readings.   Testing/Procedures:  Repeat blood work in 10 days  Referrals:  none  Special Instructions // Education:  none  Follow-Up in: 3 weeks  At the [facility], you and your health needs are our priority. We have a designated team specialized in the treatment of Heart Failure. This Care Team includes your primary Heart Failure Specialized Cardiologist (physician), Advanced Practice Providers (APPs- Physician Assistants and Nurse Practitioners), and Pharmacist who all work together to provide you with the care you need, when you need it.   You may see any of the following providers on your designated Care Team at your next follow up:  Dr Glori Bickers Dr Haynes Kerns, NP Lyda Jester, [place] [facility] [place], [place] Audry Riles, PharmD   Please be sure to bring in all your medications bottles to every appointment.   Need to Contact us:  If you have any questions or concerns before your next appointment please send [place] a message through [place] or call our office at [phone].    TO LEAVE A MESSAGE FOR THE NURSE SELECT OPTION 2, PLEASE LEAVE A MESSAGE INCLUDING: YOUR NAME DATE OF BIRTH CALL BACK NUMBER REASON FOR CALL**this is important as we prioritize the call backs  YOU WILL RECEIVE A CALL BACK THE SAME DAY AS LONG AS YOU CALL BEFORE 4:00 PM

## 2022-01-07 NOTE — Progress Notes (Signed)
Advanced Heart Failure Clinic Note   PCP: [name], MD PCP-Cardiologist: Quay Burow, MD  HF Cardiologist: Dr. Aundra Dubin  HPI: Nancy [name] 61 y.o. AAF w/ chronic diastolic heart failure, Stage IV CKD (baseline SCr ~2-3), HTN, T2DM, HLD, CVA, PVD, carotid stenosis, and tobacco abuse.   Echo 11/2019 showed EF 55-60%, RV normal. No LVH. No significant valvular dysfunction   She was referred to Dr. Gwenlyn Found for [facility] by PCP in April 2022. Complained of progressive exertional dyspnea, LEE and orthopnea. Echo 4/22 with EF 60-65%, GIIDD, mild LVH, RV normal. Cardiolite showed findings suggestive of possible anterior ischemia vs breast attenuation. R/LHC on 04/27/21 showed mild nonobstructive CAD and RHC hemodynamics c/w pulmonary HTN. Diuretics were increased w/ plans to refer to the [facility]. Dry wt felt to be ~228 lb.   Admitted 5/24-5/27/22=> discharged home on Lasix 40 daily.   Admitted 6/25-6/30/22 =>SCr bump to 3.2 (2.9 at d/c). Discharged home on Torsemide 40 daily. Echo EF 55-60%, RV normal.   Admitted 07/07/21= > direct admit from Dr. Kennon Holter office w/ marked fluid overload, progressive wt gain, exertional dyspnea, LEE and orthopnea. Clinic BP elevated 172/85. SCr 2.6 on admit. Started on lasix gtt. AHF team consulted to assist with further management. She underwent RHC, suggested ongoing volume overload with elevated PCWP and RA.  Prominent v-waves in PCWP but think due to severe diastolic dysfunction as there was no significant MR on review of echo.  PYP study in 7/22 was not suggestive of TTR cardiac amyloidosis.  Suspect severe diastolic dysfunction due to long-standing and poorly controlled HTN. SCr and potassium continued to climb and nephrology consulted. Her diuretics were adjusted and she was able to be discharged on low dose carvedilol and torsemide 40 daily; weight 226 lbs.  Admitted 09/04/21 with a left heel ulcer secondary to diabetic neuropathy.  Work-up included right ABI  of 0.45 and left ABI of 0.5.  She was started on antibiotics, no osteomyelitis on MRI. Vascular consulted and did not pursue angiography as it could potentially push her into renal failure and require initiation of HD. Ortho recommended left transtibial amputation  but she declined. Palliative care met with her but she did not want to discuss her health issues. There was no indication for HD, per nephrology, but AV fistula was recommended. She was not interested in this. PT recommended SNF however patient declined. She was discharged with Home Health.   Admitted 10/22 w/ acute on chronic HFpEF exacerbation w/ acute pulmonary edema in setting of hypertensive urgency. Acute exacerbation felt triggered by poor med compliance and dietary indiscretion w/ sodium + high fluid intake. Echo in 10/22 showed EF 50-55%, mod LVH, G2DD. Course c/b worsening CKD. SCr bumped to 3.12 (prior baseline ~ 2.5). Nephrology was consulted but felt no emergent indication for HD. She responded well to IV Lasix, though nephrology anticipated that she may be nearing need for HD soon. Plan is to follow in the outpatient setting. Also noted to have gangrenous pressure ulcers of bilateral heels left and right dorsal feet. Now followed by wound care. Discharged home on torsemide 60 mg daily. D/c SCr 3.12. D/c wt 231 lb.   Admitted 11/22 with A/C CHF exacerbation and left foot osteomyelitis. She was diuresed with IV lasix and transitioned to po. GDMT limited by CKD IV. Ortho surgery again recommended amputation, she continued to decline. She was also noted to have wound on right metatarsal, XR showed no osteomyelitis. She received bedside debridement and wound care to left foot.  She was discharged home with plans for on-going wound care, weight 245 lbs.  Received Lasix 80 mg IV + metolazone 2.5 at home for volume overload 12/13/21 via Paramedicine. No improvement in symptoms and she was advised to go to ED. Received further IV Lasix and felt  better. Discharged home.   Admitted 12/22/21 for respiratory distress requiring intubation after her AVF creation surgery.  Peripheral arterial dopplers in 1/23 with moderate right and severe left SFA stenosis.    Patient returns for followup of CHF.  Most recent creatinine 4.27, she is moving close to HD.  She has a left foot wound that is being treated.  Dr. Gwenlyn Found has seen, is considering intervention on left leg (see peripheral arterial dopplers above).  She walks around the house but does not go any further.  She is short of breath if she "moves fast."  She has marked peripheral edema.  No chest pain.  No leg pain.   ECG (personally reviewed, 1/23):  NSR, LAFB  Labs (1/23): K 3.6, creatinine 4.27  PMH:  1. CKD stage IV 2. Chronic diastolic CHF: PYP scan (8/46).  - RHC (7/22): mean RA 13, PA 68/22 mean 42, mean PCWP 25, CI 2.65, PVR 3.1. - Echo (10/22): EF 50-55%, moderate LVH, normal RV size and systolic function, normal IVC.  3. HTN 4. Peripheral arterial disease:  - Peripheral arterial dopplers (1/23): right 50-74% distal SFA, left 75-99% SFA, bilateral tibial disease.  5. Carotid stenosis: Dopplers (5/22) with totally occluded RICA, 65-99% LICA.  6. Former smoker.  7. Type 2 diabetes 8. CAD: LHC (5/22) with 50% mLAD.  9. CVA: 2020.  10. Hyperlipidemia 11. Depression  ROS: All systems reviewed and negative except as per HPI.   Current Outpatient Medications  Medication Sig Dispense Refill   acetaminophen (TYLENOL) 500 MG tablet Take 500 mg by mouth every 6 (six) hours as needed for moderate pain or headache.     amLODipine (NORVASC) 10 MG tablet Take 1 tablet (10 mg total) by mouth daily. 30 tablet 6   aspirin 81 MG EC tablet Take 1 tablet (81 mg total) by mouth daily. 60 tablet 1   atorvastatin (LIPITOR) 40 MG tablet Take 1 tablet (40 mg total) by mouth daily. 30 tablet 6   Blood Glucose Monitoring Suppl (ONETOUCH VERIO) w/Device KIT Check blood sugar three times daily.  E11.40 1 kit 0   calcium acetate (PHOSLO) 667 MG capsule Take 1 capsule (667 mg total) by mouth 3 (three) times daily with meals. 90 capsule 3   carvedilol (COREG) 12.5 MG tablet Take 1.5 tablets (18.75 mg total) by mouth 2 (two) times daily with a meal. 90 tablet 6   Continuous Blood Gluc Receiver (DEXCOM G6 RECEIVER) DEVI 1 Device by Does not apply route daily. 1 each 0   Continuous Blood Gluc Sensor (DEXCOM G6 SENSOR) MISC 1 packet by Does not apply route daily. 3 each 11   Continuous Blood Gluc Transmit (DEXCOM G6 TRANSMITTER) MISC 1 packet by Does not apply route daily. 1 each 4   ferrous sulfate 325 (65 FE) MG tablet Take 1 tablet (325 mg total) by mouth daily with breakfast. 100 tablet 1   glucose blood test strip Use as instructedCheck blood sugar three times daily. E11.40 100 each 12   insulin glargine (LANTUS) 100 UNIT/ML Solostar Pen Inject 30 Units into the skin at bedtime. 15 mL 3   insulin lispro (HUMALOG KWIKPEN) 100 UNIT/ML KwikPen Inject 4 units before breakfast and dinner.  15 mL 3   Insulin Pen Needle 32G X 4 MM MISC Use to inject insulin as directed. 100 each 4   risperiDONE (RISPERDAL) 3 MG tablet TAKE 1 TABLET (3 MG TOTAL) BY MOUTH AT BEDTIME. 30 tablet 2   [START ON 01/08/2022] metolazone (ZAROXOLYN) 2.5 MG tablet Take 1 tablet (2.5 mg total) by mouth 2 (two) times a week on Monday and Friday. 5 tablet 3   [START ON 01/08/2022] potassium chloride SA (KLOR-CON M) 20 MEQ tablet Take 2 tablets (40 mEq total) by mouth 2 (two) times a week, on Monday and Friday with Metolazone. 30 tablet 0   torsemide (DEMADEX) 20 MG tablet Take 5 tablets (100 mg total) by mouth 2 (two) times daily. 150 tablet 6   No current facility-administered medications for this encounter.   Allergies  Allergen Reactions   Hydralazine Hcl Other (See Comments)    Hair loss   Hydrocodone Itching and Other (See Comments)    Upset stomach   Metformin And Related Nausea And Vomiting and Other (See Comments)     Stomach pains, also   Other Nausea Only and Other (See Comments)    Lettuce- Does not digest this!!   Plaquenil [Hydroxychloroquine Sulfate] Hives   Shellfish-Derived Products Nausea Only and Other (See Comments)    Caused an upset stomach   Shrimp (Diagnostic) Nausea Only and Other (See Comments)    Upset stomach    Sulfa Antibiotics Hives   Social History   Socioeconomic History   Marital status: Legally Separated    Spouse name: [name]   Number of children: 1   Years of education: some colle   Highest education level: Not on file  Occupational History   Occupation: Employed FT as Landscape architect    Comment: [employer] , NA  Tobacco Use   Smoking status: Former    Packs/day: 0.25    Years: 0.50    Pack years: 0.13    Types: Cigarettes   Smokeless tobacco: Never   Tobacco comments:    Former smoker 11/03/21  Vaping Use   Vaping Use: Never used  Substance and Sexual Activity   Alcohol use: No   Drug use: No   Sexual activity: Yes    Birth control/protection: None  Other Topics Concern   Not on file  Social History Narrative   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    Social Determinants of Health   Financial Resource Strain: Low Risk    Difficulty of Paying Living Expenses: Not very hard  Food Insecurity: No Food Insecurity   Worried About Charity fundraiser in the Last Year: Never true   Ran Out of Food in the Last Year: Never true  Transportation Needs: No Transportation Needs   Lack of Transportation (Medical): No   Lack of Transportation (Non-Medical): No  Physical Activity: Not on file  Stress: Not on file  Social Connections: Not on file  Intimate Partner Violence: Not on file   Family History  Problem Relation Age of Onset   Hypertension Mother    Heart disease Mother    Diabetes Mother    Thyroid disease Mother    Congestive Heart Failure Mother    Breast cancer Maternal Grandmother    Colon cancer Maternal  Grandfather    Heart attack Sister    Heart disease Brother    Hyperlipidemia Brother    Hypertension Brother    Diabetes Father    Breast cancer Maternal  Aunt    BP 140/70    Pulse 72    Wt 110.9 kg (244 lb 6.4 oz)    SpO2 97%    BMI 43.29 kg/m   Wt Readings from Last 3 Encounters:  01/05/22 110.9 kg (244 lb 6.4 oz)  01/03/22 105.2 kg (232 lb)  12/29/21 112.9 kg (249 lb)   General: NAD Neck: JVP 10-12 cm, no thyromegaly or thyroid nodule.  Lungs: Clear to auscultation bilaterally with normal respiratory effort. CV: Nondisplaced PMI.  Heart regular S1/S2, no S3/S4, no murmur.  2+ edema to knees.  No carotid bruit.  Unable to palpate pedal pulses.  Abdomen: Soft, nontender, no hepatosplenomegaly, no distention.  Skin: Intact without lesions or rashes.  Neurologic: Alert and oriented x 3.  Psych: Normal affect. Extremities: No clubbing or cyanosis.  HEENT: Normal.   ASSESSMENT & PLAN: 1. Chronic diastolic CHF: Echo in 6/28 showed EF 55-60%, moderate LVH, normal RV, dilated IVC.  R/LHC in 5/22 showed nonobstructive CAD, pulmonary hypertension .  Multiple admissions recently with CHF, complicated by CKD stage IV.  [place] in 7/22 suggested ongoing volume overload with elevated PCWP and RA.  Prominent v-waves in PCWP but think due to severe diastolic dysfunction as there is no significant MR on review of recent echo.  PYP study in 7/22 was not suggestive of TTR cardiac amyloidosis.  Suspect severe diastolic dysfunction due to long-standing and poorly controlled HTN.  Admit for diastolic CHF 36/62 & 94/76 w/ fluid overload, c/b CKD. NYHA 3-4, functional class difficult due to LLE wound and deconditioning. She is volume overloaded today.  Management limited by CKD IV.  Suspect she is nearing ESRD.  - Increase torsemide to 100 mg bid, increase metolazone to twice weekly on Friday and Monday. She will take KCl 40 with each metolazone dose.  - BMET/BNP today, BMET in 10 days.  - Continue with  paramedicine. 2. CAD: LHC on 5/22, she has mild to moderate segmental proximal mid and distal LAD disease. Her circumflex and RCA did not have significant disease.  No chest pain.  - Continue ASA - Continue atorvastatin 40 mg daily, check lipids today.  3. CKD stage IV: Renal US with no evidence of obstruction. Followed by nephrology. Now s/p AVF creation. She needs follow up with renal soon. 4. PAD: Now with bilateral gangrenous pressure ulcers, L >R. Evaluated by vascular and orthopedic surgery during recent admissions, recommend transtibial amputation however patient declined this, opting for non-surgical healing.  Peripheral arterial dopplers in 1/23 with moderate SFA stenosis on right and severe on left, she has bilateral tibial disease.  - To followup with Dr. Gwenlyn Found to evaluate for peripheral intervention.  Concern about giving her contrast load with advanced CKD, but she is likely to progress to HD soon regardless of contrast bolus.  - Seen at [place], wounds healing per patient and husband. - Continue aspirin + statin. - Continue offloading heels, has boot on left foot. . 5. HLD: Check lipids today.  6. HTN: Mildly elevated, will not change meds today.  - Continue carvedilol 18.75 mg bid. - Continue amlodipine 10 mg daily. 7. Carotid disease: Occluded R ICA, moderate L ICA. - Continue ASA, statin.  Followup 3 wks with NP  Loralie Champagne 01/07/2022 8:50 PM]

## 2022-01-09 ENCOUNTER — Telehealth: Payer: Self-pay | Admitting: *Deleted

## 2022-01-09 ENCOUNTER — Telehealth (HOSPITAL_COMMUNITY): Payer: Self-pay

## 2022-01-09 NOTE — Telephone Encounter (Signed)
Transition Care Management Unsuccessful Follow-up Telephone Call  Date of discharge and from where: [facility] 12/24/21  Attempts:  1st Attempt  Reason for unsuccessful TCM follow-up call:  No answer/busy  Jacqlyn Larsen [facility], BSN RN Case Manager [phone]

## 2022-01-09 NOTE — Telephone Encounter (Signed)
Called Nancy [name] to reschedule our home visit to Thursday rather than tomorrow due to my absence. She agreed with plan. Call complete.

## 2022-01-10 ENCOUNTER — Telehealth: Payer: Self-pay | Admitting: *Deleted

## 2022-01-10 NOTE — Telephone Encounter (Signed)
Transition Care Management Unsuccessful Follow-up Telephone Call  Date of discharge and from where: [facility] 12/24/21  Attempts:  2nd Attempt  Reason for unsuccessful TCM follow-up call:  No answer/busy  Jacqlyn Larsen [facility], BSN RN Case Manager [phone]

## 2022-01-11 ENCOUNTER — Telehealth: Payer: Self-pay | Admitting: Internal Medicine

## 2022-01-11 ENCOUNTER — Other Ambulatory Visit (HOSPITAL_COMMUNITY): Payer: Self-pay

## 2022-01-11 ENCOUNTER — Other Ambulatory Visit (HOSPITAL_COMMUNITY): Payer: Self-pay | Admitting: *Deleted

## 2022-01-11 DIAGNOSIS — E11621 Type 2 diabetes mellitus with foot ulcer: Secondary | ICD-10-CM

## 2022-01-11 DIAGNOSIS — R442 Other hallucinations: Secondary | ICD-10-CM

## 2022-01-11 MED ORDER — METOLAZONE 2.5 MG PO TABS: 2.5000 mg | ORAL_TABLET | ORAL | 3 refills | Status: DC

## 2022-01-11 NOTE — Telephone Encounter (Signed)
-----   Message from Salena Saner, EMT sent at 01/11/2022  5:05 PM EST ----- Regarding: ortho boot Hey Dr. Wynetta Emery, Kashvi's left foot has an ortho boot on it to relieve heel offloading and the boot is torn, ragged and being held up by duct tape. Could we place an order for a new one and send it to [place]?   Thanks!

## 2022-01-11 NOTE — Progress Notes (Signed)
Paramedicine Encounter    Patient ID: Nancy [name], female    DOB: 1961/10/10, 61 y.o.   MRN: [id number]   Arrived for home visit for [place] who reports feeling good with no complaints today. She reports feeling like she has lost weight and her stomach is not as swollen. She states since taking metolazone and increased torsemide her urination output has increased but she is breathing better.   I obtained vitals and assessment. Her scale is showing her weight down 19lbs.   Swelling is still present in her lower legs but improved. Lungs clear.   CBG- 205  Dexcom replaced today.   I reviewed and verified upcoming appointments.   I verified medications and filled one pill box for one week.   She has been missing Respiridone dose so we moved it to evening spot to help with med compliance as she has been taking her evening meds accurately.   Left foot ortho boot noted to be broken and torn being held up with duct tape. I will request a new one with PCP.   Home visit complete. I will see Nancy [name] in one week on Wednesday.   Refills: Resipirdone Ferrous Sulfate Metolazone   Patient Care Team: Marcine Matar, MD as PCP - General (Internal Medicine) Runell Gess, MD as PCP - Cardiology (Cardiology) Laurey Morale, MD as PCP - Advanced Heart Failure (Cardiology)  Patient Active Problem List   Diagnosis Date Noted   S/P arteriovenous (AV) fistula creation 12/22/2021   Critical limb ischemia of both lower extremities (HCC) 11/22/2021   Osteomyelitis (HCC)    Peripheral artery disease (HCC) 10/27/2021   Acute exacerbation of congestive heart failure (HCC) 10/24/2021   Acute cardiogenic pulmonary edema (HCC) 10/06/2021   Coronary artery disease involving native coronary artery of native heart without angina pectoris 10/06/2021   Goals of care, counseling/discussion    Gangrene of left foot (HCC)    Cellulitis of left foot    Diabetic ulcer of left foot (HCC)  09/04/2021   CHF (congestive heart failure) (HCC) 07/07/2021   Normocytic anemia 06/30/2021   CKD (chronic kidney disease), stage IV (HCC) 06/11/2021   Acute on chronic diastolic CHF (congestive heart failure) (HCC) 06/10/2021   Abnormal nuclear stress test    Dyspnea on exertion 03/07/2021   Orthopnea 02/25/2021   History of cerebrovascular accident (CVA) with residual deficit 05/06/2020   Diabetic peripheral neuropathy (HCC) 04/26/2020   AKI (acute kidney injury) (HCC) 04/20/2020   Generalized weakness 04/20/2020   Dehydration 04/20/2020   Generalized abdominal pain    Pancreatitis, acute 02/29/2020   Cerebrovascular accident (CVA) (HCC) 11/08/2019   Stenosis of right carotid artery 11/08/2019   Mixed diabetic hyperlipidemia associated with type 2 diabetes mellitus (HCC) 11/08/2019   Cortical age-related cataract of both eyes 08/30/2019   Gait abnormality 08/30/2019   Statin declined 08/30/2019   Gastroesophageal reflux disease without esophagitis 04/18/2018   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   Type 2 diabetes mellitus with stage 4 chronic kidney disease, with long-term current use of insulin (HCC) 10/29/2017   History of macular degeneration 10/29/2017   Chronic cervical pain 10/29/2016   Myalgia 09/14/2016   Insomnia 09/14/2016   Tinea pedis of both feet 08/20/2016   Macular degeneration 08/17/2016   Low back pain 09/21/2014   Hypertensive urgency 09/21/2014   Diabetic gastroparesis associated with type 2 diabetes mellitus (HCC) 09/14/2014   Uncontrolled hypertension 09/08/2014   Thyroid nodule 08/17/2014   Obesity (BMI 30-39.9) 08/17/2014  Nausea & vomiting 08/04/2014   Type II diabetes mellitus with neurological manifestations, uncontrolled 08/04/2014    Current Outpatient Medications:    acetaminophen (TYLENOL) 500 MG tablet, Take 500 mg by mouth every 6 (six) hours as needed for moderate pain or headache., Disp: , Rfl:    amLODipine (NORVASC) 10 MG tablet, Take 1 tablet (10  mg total) by mouth daily., Disp: 30 tablet, Rfl: 6   aspirin 81 MG EC tablet, Take 1 tablet (81 mg total) by mouth daily., Disp: 60 tablet, Rfl: 1   atorvastatin (LIPITOR) 40 MG tablet, Take 1 tablet (40 mg total) by mouth daily., Disp: 30 tablet, Rfl: 6   Blood Glucose Monitoring Suppl (ONETOUCH VERIO) w/Device KIT, Check blood sugar three times daily. E11.40, Disp: 1 kit, Rfl: 0   calcium acetate (PHOSLO) 667 MG capsule, Take 1 capsule (667 mg total) by mouth 3 (three) times daily with meals., Disp: 90 capsule, Rfl: 3   carvedilol (COREG) 12.5 MG tablet, Take 1.5 tablets (18.75 mg total) by mouth 2 (two) times daily with a meal., Disp: 90 tablet, Rfl: 6   Continuous Blood Gluc Receiver (DEXCOM G6 RECEIVER) DEVI, 1 Device by Does not apply route daily., Disp: 1 each, Rfl: 0   Continuous Blood Gluc Sensor (DEXCOM G6 SENSOR) MISC, 1 packet by Does not apply route daily., Disp: 3 each, Rfl: 11   Continuous Blood Gluc Transmit (DEXCOM G6 TRANSMITTER) MISC, 1 packet by Does not apply route daily., Disp: 1 each, Rfl: 4   ferrous sulfate 325 (65 FE) MG tablet, Take 1 tablet (325 mg total) by mouth daily with breakfast., Disp: 100 tablet, Rfl: 1   glucose blood test strip, Use as instructedCheck blood sugar three times daily. E11.40, Disp: 100 each, Rfl: 12   insulin glargine (LANTUS) 100 UNIT/ML Solostar Pen, Inject 30 Units into the skin at bedtime., Disp: 15 mL, Rfl: 3   insulin lispro (HUMALOG KWIKPEN) 100 UNIT/ML KwikPen, Inject 4 units before breakfast and dinner., Disp: 15 mL, Rfl: 3   Insulin Pen Needle 32G X 4 MM MISC, Use to inject insulin as directed., Disp: 100 each, Rfl: 4   metolazone (ZAROXOLYN) 2.5 MG tablet, Take 1 tablet (2.5 mg total) by mouth 2 (two) times a week on Monday and Friday., Disp: 5 tablet, Rfl: 3   potassium chloride SA (KLOR-CON M) 20 MEQ tablet, Take 2 tablets (40 mEq total) by mouth 2 (two) times a week, on Monday and Friday with Metolazone., Disp: 30 tablet, Rfl: 0    risperiDONE (RISPERDAL) 3 MG tablet, TAKE 1 TABLET (3 MG TOTAL) BY MOUTH AT BEDTIME., Disp: 30 tablet, Rfl: 2   torsemide (DEMADEX) 20 MG tablet, Take 5 tablets (100 mg total) by mouth 2 (two) times daily., Disp: 150 tablet, Rfl: 6 Allergies  Allergen Reactions   Hydralazine Hcl Other (See Comments)    Hair loss   Hydrocodone Itching and Other (See Comments)    Upset stomach   Metformin And Related Nausea And Vomiting and Other (See Comments)    Stomach pains, also   Other Nausea Only and Other (See Comments)    Lettuce- Does not digest this!!   Plaquenil [Hydroxychloroquine Sulfate] Hives   Shellfish-Derived Products Nausea Only and Other (See Comments)    Caused an upset stomach   Shrimp (Diagnostic) Nausea Only and Other (See Comments)    Upset stomach    Sulfa Antibiotics Hives     Social History   Socioeconomic History   Marital status: Legally  Separated    Spouse name: [name]   Number of children: 1   Years of education: some colle   Highest education level: Not on file  Occupational History   Occupation: Employed FT as Landscape architect    Comment: [employer] , NA  Tobacco Use   Smoking status: Former    Packs/day: 0.25    Years: 0.50    Pack years: 0.13    Types: Cigarettes   Smokeless tobacco: Never   Tobacco comments:    Former smoker 11/03/21  Vaping Use   Vaping Use: Never used  Substance and Sexual Activity   Alcohol use: No   Drug use: No   Sexual activity: Yes    Birth control/protection: None  Other Topics Concern   Not on file  Social History Narrative   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  Constitutional:      Appearance: Normal appearance. She is normal weight.  HENT:     Head: Normocephalic.     Nose: Nose normal.     Mouth/Throat:     Mouth: Mucous membranes are moist.     Pharynx: Oropharynx is clear.  Eyes:     Conjunctiva/sclera: Conjunctivae normal.     Pupils: Pupils are equal, round, and reactive to light.  Cardiovascular:     Rate and Rhythm: Normal rate and regular rhythm.     Pulses: Normal pulses.     Heart sounds: Normal heart sounds.  Pulmonary:     Effort: Pulmonary effort is normal.     Breath sounds: Normal breath sounds.  Abdominal:     General: Abdomen is flat.     Palpations: Abdomen is soft.  Musculoskeletal:        General: Swelling present. Normal range of motion.     Cervical back: Normal range of motion.     Right lower leg: Edema present.     Left lower leg: Edema present.  Skin:    General: Skin is warm and dry.     Capillary Refill: Capillary refill takes less than 2 seconds.  Neurological:     General: No focal deficit present.     Mental Status: She is alert. Mental status is at baseline.  Psychiatric:        Mood and Affect: Mood normal.        Future Appointments  Date Time Provider [place]  01/18/2022 11:00 AM Ricard Dillon, MD [facility]  01/19/2022 10:45 AM [facility] LAB [facility] None  01/26/2022 12:00 PM [facility] PA/NP [facility] None  01/31/2022 11:00 AM [facility] HS VASC 2 - [facility] VVS  01/31/2022 11:45 AM [facility] PA [facility]  05/18/2022 11:15 AM [name], MD [facility]     ACTION: Home visit completed

## 2022-01-15 ENCOUNTER — Other Ambulatory Visit (HOSPITAL_COMMUNITY): Payer: Medicaid Other

## 2022-01-15 MED ORDER — RISPERIDONE 3 MG PO TABS: ORAL_TABLET | ORAL | 2 refills | Status: DC

## 2022-01-15 NOTE — Telephone Encounter (Signed)
-----   Message from Salena Saner, EMT sent at 01/15/2022  8:07 AM EST ----- Regarding: RE: refill She does have this at home, it was last filled by [facility] a few months ago but she has missed several doses despite me placing it in her pill box at bedtime. ----- Message ----- From: [name], MD Sent: 01/11/2022  10:03 PM EST To: Salena Saner, EMT Subject: RE: refill                                     I do not think she has been taking this Heather.  Does she have an updated bottle at home that was filled within the last 2 months?  The last prescription for it on her medication list was February 2022 for a 30-day supply.  Please verify whether she has this at home and has been taking it. ----- Message ----- From: Salena Saner, EMT Sent: 01/11/2022   5:09 PM EST To: [name], MD Subject: refill                                         Mrs. Whiting needs Risperidone RX sent to to [place] also please. Thanks!

## 2022-01-16 NOTE — Telephone Encounter (Signed)
Rx was faxed today to [place]

## 2022-01-17 ENCOUNTER — Other Ambulatory Visit (HOSPITAL_COMMUNITY): Payer: Self-pay

## 2022-01-17 ENCOUNTER — Telehealth (HOSPITAL_COMMUNITY): Payer: Self-pay | Admitting: Licensed Clinical Social Worker

## 2022-01-17 NOTE — Telephone Encounter (Signed)
HF Paramedicine Team Based Care Meeting  HF MD- NA  HF NP - [place] NP-C   [facility] admit within the last 30 days for heart failure? Yes- admitted after paramedicine administered IV lasix was insufficient  Medications concerns? Paramedic filling pill boxes at this time eventual bubble packs  Transportation issues? no  Education needs? Difficult to educate at times- has h/o strokes  SDOH - lives at hotel- both have source of income  Eligible for discharge? Not at this time- relatively new  Jorge Ny, [facility] Desk#: [phone] Cell#: [phone]

## 2022-01-17 NOTE — Progress Notes (Signed)
Paramedicine Encounter    Patient ID: Nancy [name], female    DOB: [birth year], 61 y.o.   MRN: [id number]  Arrived for home visit for [name] who was alert and oriented seated in her recliner reporting to be feeling good. She denied shortness of breath, chest pain, dizziness or trouble with her medications. Rain was compliant with all meds over the last week. Her weight is down 1 lb. Swelling still present in both lower legs. Heel boot still in place on the left leg, new one was ordered and should be getting delivered soon. She will be following up with wound care tomorrow.   I obtained vitals and assessment.  CBG-166  I reviewed all meds and filled pill box accordingly.   Refills:  Ferrous Sulfate Metolazone   I wrote down all upcoming appointments in provided note book. Confirmed same with her husband. He agreed.   I plan to see Nancy [name] in one week. Home visit complete.   Patient Care Team: [name], MD as PCP - General (Internal Medicine) [name], MD as PCP - Cardiology (Cardiology) Larey Dresser, MD as PCP - Advanced Heart Failure (Cardiology)  Patient Active Problem List   Diagnosis Date Noted   S/P arteriovenous (AV) fistula creation 12/22/2021   Critical limb ischemia of both lower extremities ([place]) 11/22/2021   Osteomyelitis ([place])    Peripheral artery disease ([place]) 10/27/2021   Acute exacerbation of congestive heart failure ([place]) 10/24/2021   Acute cardiogenic pulmonary edema ([place]) 10/06/2021   Coronary artery disease involving native coronary artery of native heart without angina pectoris 10/06/2021   Goals of care, counseling/discussion    Gangrene of left foot ([place])    Cellulitis of left foot    Diabetic ulcer of left foot ([place]) 09/04/2021   CHF (congestive heart failure) ([place]) 07/07/2021   Normocytic anemia 06/30/2021   CKD (chronic kidney disease), stage IV ([place]) 06/11/2021   Acute on chronic diastolic CHF (congestive heart  failure) ([place]) 06/10/2021   Abnormal nuclear stress test    Dyspnea on exertion 03/07/2021   Orthopnea 02/25/2021   History of cerebrovascular accident (CVA) with residual deficit 05/06/2020   Diabetic peripheral neuropathy ([place]) 04/26/2020   AKI (acute kidney injury) ([place]) 04/20/2020   Generalized weakness 04/20/2020   Dehydration 04/20/2020   Generalized abdominal pain    Pancreatitis, acute 02/29/2020   Cerebrovascular accident (CVA) ([place]) 11/08/2019   Stenosis of right carotid artery 11/08/2019   Mixed diabetic hyperlipidemia associated with type 2 diabetes mellitus ([place]) 11/08/2019   Cortical age-related cataract of both eyes 08/30/2019   Gait abnormality 08/30/2019   Statin declined 08/30/2019   Gastroesophageal reflux disease without esophagitis 04/18/2018   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   Type 2 diabetes mellitus with stage 4 chronic kidney disease, with long-term current use of insulin ([place]) 10/29/2017   History of macular degeneration 10/29/2017   Chronic cervical pain 10/29/2016   Myalgia 09/14/2016   Insomnia 09/14/2016   Tinea pedis of both feet 08/20/2016   Macular degeneration 08/17/2016   Low back pain 09/21/2014   Hypertensive urgency 09/21/2014   Diabetic gastroparesis associated with type 2 diabetes mellitus ([place]) 09/14/2014   Uncontrolled hypertension 09/08/2014   Thyroid nodule 08/17/2014   Obesity (BMI 30-39.9) 08/17/2014   Nausea & vomiting 08/04/2014   Type II diabetes mellitus with neurological manifestations, uncontrolled 08/04/2014    Current Outpatient Medications:    acetaminophen (TYLENOL) 500 MG tablet, Take 500 mg by mouth every 6 (six) hours as needed  for moderate pain or headache., Disp: , Rfl:    amLODipine (NORVASC) 10 MG tablet, Take 1 tablet (10 mg total) by mouth daily., Disp: 30 tablet, Rfl: 6   aspirin 81 MG EC tablet, Take 1 tablet (81 mg total) by mouth daily., Disp: 60 tablet, Rfl: 1   atorvastatin (LIPITOR) 40 MG tablet, Take 1 tablet  (40 mg total) by mouth daily., Disp: 30 tablet, Rfl: 6   Blood Glucose Monitoring Suppl (ONETOUCH VERIO) w/Device KIT, Check blood sugar three times daily. E11.40, Disp: 1 kit, Rfl: 0   calcium acetate (PHOSLO) 667 MG capsule, Take 1 capsule (667 mg total) by mouth 3 (three) times daily with meals., Disp: 90 capsule, Rfl: 3   carvedilol (COREG) 12.5 MG tablet, Take 1.5 tablets (18.75 mg total) by mouth 2 (two) times daily with a meal., Disp: 90 tablet, Rfl: 6   Continuous Blood Gluc Receiver (DEXCOM G6 RECEIVER) DEVI, 1 Device by Does not apply route daily., Disp: 1 each, Rfl: 0   Continuous Blood Gluc Sensor (DEXCOM G6 SENSOR) MISC, 1 packet by Does not apply route daily., Disp: 3 each, Rfl: 11   Continuous Blood Gluc Transmit (DEXCOM G6 TRANSMITTER) MISC, 1 packet by Does not apply route daily., Disp: 1 each, Rfl: 4   ferrous sulfate 325 (65 FE) MG tablet, Take 1 tablet (325 mg total) by mouth daily with breakfast., Disp: 100 tablet, Rfl: 1   glucose blood test strip, Use as instructedCheck blood sugar three times daily. E11.40, Disp: 100 each, Rfl: 12   insulin glargine (LANTUS) 100 UNIT/ML Solostar Pen, Inject 30 Units into the skin at bedtime., Disp: 15 mL, Rfl: 3   insulin lispro (HUMALOG KWIKPEN) 100 UNIT/ML KwikPen, Inject 4 units before breakfast and dinner., Disp: 15 mL, Rfl: 3   Insulin Pen Needle 32G X 4 MM MISC, Use to inject insulin as directed., Disp: 100 each, Rfl: 4   metolazone (ZAROXOLYN) 2.5 MG tablet, Take 1 tablet (2.5 mg total) by mouth 2 (two) times a week on Monday and Friday., Disp: 10 tablet, Rfl: 3   potassium chloride SA (KLOR-CON M) 20 MEQ tablet, Take 2 tablets (40 mEq total) by mouth 2 (two) times a week, on Monday and Friday with Metolazone., Disp: 30 tablet, Rfl: 0   risperiDONE (RISPERDAL) 3 MG tablet, TAKE 1 TABLET (3 MG TOTAL) BY MOUTH AT BEDTIME., Disp: 30 tablet, Rfl: 2   torsemide (DEMADEX) 20 MG tablet, Take 5 tablets (100 mg total) by mouth 2 (two) times  daily., Disp: 150 tablet, Rfl: 6 Allergies  Allergen Reactions   Hydralazine Hcl Other (See Comments)    Hair loss   Hydrocodone Itching and Other (See Comments)    Upset stomach   Metformin And Related Nausea And Vomiting and Other (See Comments)    Stomach pains, also   Other Nausea Only and Other (See Comments)    Lettuce- Does not digest this!!   Plaquenil [Hydroxychloroquine Sulfate] Hives   Shellfish-Derived Products Nausea Only and Other (See Comments)    Caused an upset stomach   Shrimp (Diagnostic) Nausea Only and Other (See Comments)    Upset stomach    Sulfa Antibiotics Hives     Social History   Socioeconomic History   Marital status: Legally Separated    Spouse name: [name]   Number of children: 1   Years of education: some colle   Highest education level: Not on file  Occupational History   Occupation: Employed FT as Landscape architect  Comment: [employer] , NA  Tobacco Use   Smoking status: Former    Packs/day: 0.25    Years: 0.50    Pack years: 0.13    Types: Cigarettes   Smokeless tobacco: Never   Tobacco comments:    Former smoker 11/03/21  Vaping Use   Vaping Use: Never used  Substance and Sexual Activity   Alcohol use: No   Drug use: No   Sexual activity: Yes    Birth control/protection: None  Other Topics Concern   Not on file  Social History Narrative   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  Constitutional:      Appearance: Normal appearance. She is normal weight.  HENT:     Head: Normocephalic.     Nose: Nose normal.     Mouth/Throat:     Mouth: Mucous membranes are moist.     Pharynx: Oropharynx is clear.  Eyes:     Conjunctiva/sclera: Conjunctivae normal.     Pupils: Pupils are equal, round, and reactive to light.  Cardiovascular:     Rate and Rhythm: Normal rate and regular rhythm.     Pulses: Normal pulses.     Heart sounds: Normal heart sounds.  Pulmonary:     Effort: Pulmonary effort is normal.     Breath sounds: Normal breath sounds.  Abdominal:     General: Abdomen is flat.     Palpations: Abdomen is soft.  Musculoskeletal:        General: Swelling present. Normal range of motion.     Cervical back: Normal range of motion.     Right lower leg: Edema present.     Left lower leg: Edema present.  Skin:    General: Skin is warm and dry.     Capillary Refill: Capillary refill takes less than 2 seconds.  Neurological:     General: No focal deficit present.     Mental Status: She is alert. Mental status is at baseline.  Psychiatric:        Mood and Affect: Mood normal.        Future Appointments  Date Time Provider [place]  01/18/2022 11:00 AM Ricard Dillon, MD [facility]  01/19/2022 10:45 AM [facility] LAB [facility] None  01/26/2022 12:00 PM [facility] PA/NP [facility] None  01/31/2022 11:00 AM [facility] HS VASC 2 - [facility] VVS  01/31/2022 11:45 AM [facility] PA [facility]  05/18/2022 11:15 AM [name], MD [facility]     ACTION: Home visit completed

## 2022-01-18 ENCOUNTER — Encounter (HOSPITAL_BASED_OUTPATIENT_CLINIC_OR_DEPARTMENT_OTHER): Payer: Medicaid Other | Attending: Internal Medicine | Admitting: Internal Medicine

## 2022-01-18 ENCOUNTER — Other Ambulatory Visit: Payer: Self-pay

## 2022-01-18 DIAGNOSIS — E1152 Type 2 diabetes mellitus with diabetic peripheral angiopathy with gangrene: Secondary | ICD-10-CM | POA: Insufficient documentation

## 2022-01-18 DIAGNOSIS — I5032 Chronic diastolic (congestive) heart failure: Secondary | ICD-10-CM | POA: Insufficient documentation

## 2022-01-18 DIAGNOSIS — L97428 Non-pressure chronic ulcer of left heel and midfoot with other specified severity: Secondary | ICD-10-CM | POA: Insufficient documentation

## 2022-01-18 DIAGNOSIS — E11621 Type 2 diabetes mellitus with foot ulcer: Secondary | ICD-10-CM | POA: Insufficient documentation

## 2022-01-18 DIAGNOSIS — N189 Chronic kidney disease, unspecified: Secondary | ICD-10-CM | POA: Diagnosis not present

## 2022-01-18 DIAGNOSIS — L97528 Non-pressure chronic ulcer of other part of left foot with other specified severity: Secondary | ICD-10-CM | POA: Diagnosis not present

## 2022-01-18 DIAGNOSIS — E1142 Type 2 diabetes mellitus with diabetic polyneuropathy: Secondary | ICD-10-CM | POA: Diagnosis not present

## 2022-01-18 DIAGNOSIS — Z794 Long term (current) use of insulin: Secondary | ICD-10-CM | POA: Insufficient documentation

## 2022-01-18 DIAGNOSIS — E1122 Type 2 diabetes mellitus with diabetic chronic kidney disease: Secondary | ICD-10-CM | POA: Insufficient documentation

## 2022-01-18 DIAGNOSIS — I132 Hypertensive heart and chronic kidney disease with heart failure and with stage 5 chronic kidney disease, or end stage renal disease: Secondary | ICD-10-CM | POA: Diagnosis not present

## 2022-01-18 NOTE — Progress Notes (Signed)
LYSETTE, LINDENBAUM ([id number]) Visit Report for 01/18/2022 Debridement Details Patient Name: Date of Service: Nancy [name] 01/18/2022 11:00 A M Medical Record Number: [id number] Patient Account Number: [id number] Date of Birth/Sex: Treating RN: 10-10-1961 (61 y.o. Martyn Malay, Linda Primary Care Provider: [name] Other Clinician: Referring Provider: Treating Provider/Extender: Nyra Market in Treatment: 15 Debridement Performed for Assessment: Wound #2 Left Calcaneus Performed By: Physician Ricard Dillon., MD Debridement Type: Debridement Severity of Tissue Pre Debridement: Fat layer exposed Level of Consciousness (Pre-procedure): Awake and Alert Pre-procedure Verification/Time Out Yes - 11:40 Taken: Start Time: 11:42 T Area Debrided (L x W): otal 2.9 (cm) x 1 (cm) = 2.9 (cm) Tissue and other material debrided: Viable, Non-Viable, Slough, Subcutaneous, Skin: Epidermis, Slough Level: Skin/Subcutaneous Tissue Debridement Description: Excisional Instrument: Curette Bleeding: Minimum Hemostasis Achieved: Pressure Procedural Pain: 0 Post Procedural Pain: 0 Response to Treatment: Procedure was tolerated well Level of Consciousness (Post- Awake and Alert procedure): Post Debridement Measurements of Total Wound Length: (cm) 2.9 Width: (cm) 3.2 Depth: (cm) 1.2 Volume: (cm) 8.746 Character of Wound/Ulcer Post Debridement: Improved Severity of Tissue Post Debridement: Fat layer exposed Post Procedure Diagnosis Same as Pre-procedure Electronic Signature(s) Signed: 01/18/2022 4:50:25 PM By: Linton Ham MD Signed: 01/18/2022 6:25:59 PM By: Baruch Gouty RN, BSN Entered By: Linton Ham on 01/18/2022 12:32:41 -------------------------------------------------------------------------------- HPI Details Patient Name: Date of Service: Nancy Nancy Aly H. 01/18/2022 11:00 A M Medical Record Number: [id number] Patient Account  Number: [id number] Date of Birth/Sex: Treating RN: 07-11-1961 (61 y.o. [name] Primary Care Provider: [name] Other Clinician: Referring Provider: Treating Provider/Extender: Nyra Market in Treatment: 15 History of Present Illness HPI Description: ADMISSION 10/04/2021 This is a 61 year old woman with multiple severe medical issues who arrives for review of wounds on her left foot. Recent medical history in epic shows a cellulitis of these left foot with gangrenous change and admission to hospital from 9/19 through 10/6. An MRI of the foot surprisingly did not show osteomyelitis. In the hospital she was treated with IV antibiotics and then discharged on Augmentin and doxycycline she has completed this. She also had noninvasive arterial studies that showed an ABI in the right of 0.45 on the left 0.46 monophasic waveforms bilaterally. They did not do TBI's. The patient was seen by Dr. Trula Slade in consult in the hospital. He felt she had evidence of arterial occlusive disease with an ABI on the left of 0.5 with monophasic waveforms at the ankle. Noted that any attempt at using dye in this patient would potentially push her into acute renal failure and with the patient who has been refusing dialysis this would be a fatal event. The patient saw palliative care but she was not interested in pursuing pure palliative care or SNF placement she has Medicaid pending but she does have home health The patient also was seen by nephrology in the hospital. She told them she did not want dialysis I think because her mother was on dialysis at 1 point. They did recommend a AV fistula. Again the patient refused. They noted deterioration from stage IV to stage V chronic renal failure but they did not feel there was an urgent need for dialysis at that moment. The patient has a large boggy ischemic wound on most of her left heel. On the left lateral foot there is an open  area with covering dry gangrene. On the dorsal foot just distal to the ankle there  is a better looking wound surface that may have been a wrap injury at 1 point. The patient says that her heel and lateral foot have been there for about 2 months and the dorsal foot is uncertain. She has minimal tolerance with walking because of pain in her legs likely claudication. 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 Her discharge creatinine in the hospital was 2.75 with an estimated GFR of 19. It was much higher than this on presentation. Both vascular and Ortho recommended an amputation which the patient refused 11/3; since the patient was last here she was hospitalized for 5 days from 10/05/2021 through 10/10/2021 this was because of acute on chronic congestive heart failure. Post diuresis she was felt to have chronic kidney disease stage IV. Nephrology was consulted during the original admission and felt there was no indication for starting dialysis while an inpatient. Apparently has a follow-up with nephrologist 62. Salk in 2 or 3 months. She did not see a vascular during this hospitalization although she previously saw Dr. Trula Slade at a point in time where she was apparently refusing dialysis although I have said previously I do not think she really understood what she was being told We have been using Betadine on the necrotic wounds on the heel and the lateral foot and silver collagen on the dorsal foot wound on the left she does not have any wounds on the right. She comes in today with a necrotic eschar on the heel and the lateral foot separating visibly. 12/1; since the patient was last here almost a month ago she was hospitalized from 10/24/2021 through 11/01/2021. 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 They correctly pointed out that her MRI was contraindicated but did not do anything beyond this. Orthopedic surgeons recommended an amputation which I am not sure that the patient agreed to. Also saw infectious disease who confirmed that "antibiotic treatment is not curative" she also had acute on chronic diastolic heart failure and now they are saying her chronic kidney disease stage IV is actually improved. I am not sure why they would not have addressed her vascular issues in the hospital. I looked back on her original arterial studies in September. These showed monophasic waveforms at the PTA and dorsalis pedis on the left with a ABI of 0.50. They are still using Dakin's wet-to-dry. 12/15Since the patient was last here I had a conversation with Dr. Gwenlyn Found. He is going to go ahead with arterial Dopplers on the left leg to see if he can determine the level of obstruction accounting for the ABI of 0.5 during her initial hospitalization. I feel that if there is some target that we can look at it and the risk is acceptable to go ahead and do angiography on her. She understands that this very well could throw her into dialysis dependent chronic renal failure. I previously discussed this at length with her husband and her 2/2; almost a 6-week hiatus since we last saw this patient. Since last time she was here she was hospitalized from 12/22/2021 through 12/24/2021. At the time she was having a right arm AV shunt placed. She developed post operative respiratory distress requiring intubation she was transferred to the ICU. Indicates she now has a functioning shunt in the right arm but not ready for use. Her discharge  summary written noted PAD with bilateral gangrenous pressure ulcers left greater than right. She did not to my knowledge have wounds on the right leg but she was dealing with necrotic wounds on the left heel and the left lateral foot. She refused a BKA. Her husband is now using Dakin's  wet-to-dry. She arrives in clinic today with the area on the left lateral foot healed she still has a substantial but generally better looking area on the plantar left heel She had recent noninvasive arterial studies on the left. These were on 01/06/2022. showing a 30 to 49% stenosis in the common femoral artery is 75 to 99% stenosis in the distal femoral artery and a 30- 49% stenosis in the popliteal artery. ABI in the left is 0.97 at the dorsalis pedis pulse with although all of her waveforms were monophasic. She had more significant findings on the right however she does not have an open wound here. Her husband's been using Dakin's wet-to-dry daily. The heel wound surface actually look quite good however she had thick cave then areas of skin and subcutaneous tissue around the margins She is back in clinic today. Electronic Signature(s) Signed: 01/18/2022 4:50:25 PM By: Linton Ham MD Entered By: Linton Ham on 01/18/2022 12:38:07 -------------------------------------------------------------------------------- Physical Exam Details Patient Name: Date of Service: Nancy Nancy Aly H. 01/18/2022 11:00 A M Medical Record Number: [id number] Patient Account Number: [id number] Date of Birth/Sex: Treating RN: 1961-06-29 (61 y.o. [name] Primary Care Provider: [name] Other Clinician: Referring Provider: Treating Provider/Extender: Nyra Market in Treatment: 15 Constitutional Sitting or standing Blood Pressure is within target range for patient.. Pulse regular and within target range for patient.Marland Kitchen Respirations regular, non-labored and within target range.. Temperature is normal and within the target range for the patient.Marland Kitchen Appears in no distress. Notes Wound exam; plantar left heel wound healthy granulated base. Thick areas of skin and subcutaneous tissue caved in around the wound margins which I removed with a #5 curette. Notable for the fact  that she had a lot of bleeding requiring silver nitrate and a pressure dressing. There is no evidence of surrounding infection Electronic Signature(s) Signed: 01/18/2022 4:50:25 PM By: Linton Ham MD Entered By: Linton Ham on 01/18/2022 12:38:58 -------------------------------------------------------------------------------- Physician Orders Details Patient Name: Date of Service: Nancy Nancy Aly H. 01/18/2022 11:00 A M Medical Record Number: [id number] Patient Account Number: [id number] Date of Birth/Sex: Treating RN: 09/06/1961 (61 y.o. 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 - with Dr. Dellia Nims and Vaughan Basta Return A Bathing/ Shower/ Hygiene May shower with protection but do not get wound dressing(s) wet. Edema Control - Lymphedema / SCD / Other Elevate legs to the level of the heart or above for 30 minutes daily and/or when sitting, a frequency of: Avoid standing for long periods of time. Moisturize legs daily. Off-Loading Heel suspension boot to: - globoped heel offloading sandal to left foot Other: - Offloading  boot to left foot; float heel/foot when sitting/laying down. Use pillow and place under your leg so your heel isnt laying against bed!!!! Wound Treatment Wound #2 - Calcaneus Wound Laterality:  Left Cleanser: Normal Saline ([place]) 1 x Per Day/30 Days Discharge Instructions: Cleanse the wound with Normal Saline prior to applying a clean dressing using gauze sponges, not tissue or cotton balls. Cleanser: Soap and Water [facility]) 1 x Per Day/30 Days Discharge Instructions: May shower and wash wound with dial antibacterial soap and water prior to dressing change. Cleanser: Wound Cleanser (Home Health) 1 x Per Day/30 Days Discharge Instructions: Cleanse the wound with wound cleanser prior to applying a clean dressing using gauze sponges, not tissue or cotton balls. Prim Dressing: 1/4 strength Dakin's wet to dry (Home Health) 1 x Per Day/30 Days ary Discharge Instructions: moisten gauze with Dakin's and pack lightly into wound Secondary Dressing: Woven Gauze Sponge, Non-Sterile 4x4 in (Home Health) 1 x Per Day/30 Days Discharge Instructions: Apply over primary dressing as directed. Secondary Dressing: ABD Pad, 5x9 (Home Health) 1 x Per Day/30 Days Discharge Instructions: Apply over primary dressing as directed. Secured With: [employer], 4.5x3.1 (in/yd) (Home Health) 1 x Per Day/30 Days Discharge Instructions: Secure with Kerlix as directed. Secured With: 68M Medipore H Soft Cloth Surgical T ape, 4 x 10 (in/yd) (Home Health) 1 x Per Day/30 Days Discharge Instructions: Secure with tape as directed. Electronic Signature(s) Signed: 01/18/2022 4:50:25 PM By: Linton Ham MD Signed: 01/18/2022 6:25:59 PM By: Baruch Gouty RN, BSN Entered By: Baruch Gouty on 01/18/2022 12:08:42 -------------------------------------------------------------------------------- Problem List Details Patient Name: Date of Service: Nancy Nancy Aly H. 01/18/2022 11:00 A M Medical Record Number: [id number] Patient Account Number: [id number] Date of Birth/Sex: Treating RN: 1961/05/06 (61 y.o. Martyn Malay, Linda Primary Care Provider: [name] Other Clinician: Referring  Provider: Treating Provider/Extender: Nyra Market in Treatment: 15 Active Problems ICD-10 Encounter Code Description Active Date MDM Diagnosis E11.621 Type 2 diabetes mellitus with foot ulcer 10/04/2021 No Yes E11.52 Type 2 diabetes mellitus with diabetic peripheral angiopathy with gangrene 10/04/2021 No Yes L97.428 Non-pressure chronic ulcer of left heel and midfoot with other specified 10/04/2021 No Yes severity L97.528 Non-pressure chronic ulcer of other part of left foot with other specified 10/04/2021 No Yes severity I12.0 Hypertensive chronic kidney disease with stage 5 chronic kidney disease or 10/04/2021 No Yes end stage renal disease Inactive Problems Resolved Problems Electronic Signature(s) Signed: 01/18/2022 4:50:25 PM By: Linton Ham MD Entered By: Linton Ham on 01/18/2022 12:32:14 -------------------------------------------------------------------------------- Progress Note Details Patient Name: Date of Service: Nancy [place]. 01/18/2022 11:00 A M Medical Record Number: [id number] Patient Account Number: [id number] Date of Birth/Sex: Treating RN: 17-Aug-1961 (61 y.o. [name] Primary Care Provider: [name] Other Clinician: Referring Provider: Treating Provider/Extender: Nyra Market in Treatment: 15 Subjective History of Present Illness (HPI) ADMISSION 10/04/2021 This is a 61 year old woman with multiple severe medical issues who arrives for review of wounds on her left foot. Recent medical history in epic shows a cellulitis of these left foot with gangrenous change and admission to hospital from 9/19 through 10/6. An MRI of the foot surprisingly did not show osteomyelitis. In the hospital she was treated with IV antibiotics and then discharged on Augmentin and doxycycline she has completed this. She also had noninvasive arterial studies that showed an ABI in the right of 0.45  on the left 0.46 monophasic waveforms bilaterally. They  did not do TBI's. The patient was seen by Dr. Trula Slade in consult in the hospital. He felt she had evidence of arterial occlusive disease with an ABI on the left of 0.5 with monophasic waveforms at the ankle. Noted that any attempt at using dye in this patient would potentially push her into acute renal failure and with the patient who has been refusing dialysis this would be a fatal event. The patient saw palliative care but she was not interested in pursuing pure palliative care or SNF placement she has Medicaid pending but she does have home health The patient also was seen by nephrology in the hospital. She told them she did not want dialysis I think because her mother was on dialysis at 1 point. They did recommend a AV fistula. Again the patient refused. They noted deterioration from stage IV to stage V chronic renal failure but they did not feel there was an urgent need for dialysis at that moment. The patient has a large boggy ischemic wound on most of her left heel. On the left lateral foot there is an open area with covering dry gangrene. On the dorsal foot just distal to the ankle there is a better looking wound surface that may have been a wrap injury at 1 point. The patient says that her heel and lateral foot have been there for about 2 months and the dorsal foot is uncertain. She has minimal tolerance with walking because of pain in her legs likely claudication. 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 Her discharge creatinine in the hospital was 2.75 with an estimated GFR of 19. It was much higher than this on presentation. Both vascular and Ortho recommended an amputation which the patient refused 11/3; since the patient was last here she was hospitalized for 5 days from 10/05/2021 through 10/10/2021 this was  because of acute on chronic congestive heart failure. Post diuresis she was felt to have chronic kidney disease stage IV. Nephrology was consulted during the original admission and felt there was no indication for starting dialysis while an inpatient. Apparently has a follow-up with nephrologist 62. Salk in 2 or 3 months. She did not see a vascular during this hospitalization although she previously saw Dr. Trula Slade at a point in time where she was apparently refusing dialysis although I have said previously I do not think she really understood what she was being told We have been using Betadine on the necrotic wounds on the heel and the lateral foot and silver collagen on the dorsal foot wound on the left she does not have any wounds on the right. She comes in today with a necrotic eschar on the heel and the lateral foot separating visibly. 12/1; since the patient was last here almost a month ago she was hospitalized from 10/24/2021 through 11/01/2021. 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 They correctly pointed out that her MRI was contraindicated but did not do anything beyond this. Orthopedic surgeons recommended an amputation which I am not sure that the patient agreed to. Also saw infectious disease who confirmed that "antibiotic treatment is not curative" she also had acute on chronic diastolic heart failure and now they are saying her chronic kidney disease stage IV is actually improved. I am not sure why they would not have addressed her vascular issues  in the hospital. I looked back on her original arterial studies in September. These showed monophasic waveforms at the PTA and dorsalis pedis on the left with a ABI of 0.50. They are still using Dakin's wet-to-dry. 12/15Since the patient was last here I had a conversation with Dr. Gwenlyn Found. He is going to go ahead with arterial Dopplers on the left leg  to see if he can determine the level of obstruction accounting for the ABI of 0.5 during her initial hospitalization. I feel that if there is some target that we can look at it and the risk is acceptable to go ahead and do angiography on her. She understands that this very well could throw her into dialysis dependent chronic renal failure. I previously discussed this at length with her husband and her 2/2; almost a 6-week hiatus since we last saw this patient. Since last time she was here she was hospitalized from 12/22/2021 through 12/24/2021. At the time she was having a right arm AV shunt placed. She developed post operative respiratory distress requiring intubation she was transferred to the ICU. Indicates she now has a functioning shunt in the right arm but not ready for use. Her discharge summary written noted PAD with bilateral gangrenous pressure ulcers left greater than right. She did not to my knowledge have wounds on the right leg but she was dealing with necrotic wounds on the left heel and the left lateral foot. She refused a BKA. Her husband is now using Dakin's wet-to-dry. She arrives in clinic today with the area on the left lateral foot healed she still has a substantial but generally better looking area on the plantar left heel She had recent noninvasive arterial studies on the left. These were on 01/06/2022. showing a 30 to 49% stenosis in the common femoral artery is 75 to 99% stenosis in the distal femoral artery and a 30- 49% stenosis in the popliteal artery. ABI in the left is 0.97 at the dorsalis pedis pulse with although all of her waveforms were monophasic. She had more significant findings on the right however she does not have an open wound here. Her husband's been using Dakin's wet-to-dry daily. The heel wound surface actually look quite good however she had thick cave then areas of skin and subcutaneous tissue around the margins She is back in clinic today. Patient  History Unable to Obtain Patient History due to Altered Mental Status. Information obtained from Patient, Caregiver, Chart. Family History Heart Disease - Mother,Father, No family history of Cancer, Diabetes, Hereditary Spherocytosis, Kidney Disease, Lung Disease. Social History Never smoker, Marital Status - Married, Alcohol Use - Never, Drug Use - No History, Caffeine Use - Moderate. Medical History Eyes Denies history of Cataracts, Glaucoma, Optic Neuritis Cardiovascular Patient has history of Congestive Heart Failure, Hypertension, Peripheral Arterial Disease Denies history of Angina, Arrhythmia, Coronary Artery Disease, Deep Vein Thrombosis, Hypotension, Myocardial Infarction, Peripheral Venous Disease, Phlebitis, Vasculitis Endocrine Patient has history of Type II Diabetes Denies history of Type I Diabetes Genitourinary Denies history of End Stage Renal Disease Integumentary (Skin) Denies history of History of Burn Musculoskeletal Denies history of Gout, Rheumatoid Arthritis, Osteoarthritis, Osteomyelitis Neurologic Patient has history of Dementia, Neuropathy Denies history of Quadriplegia, Paraplegia, Seizure Disorder Psychiatric Denies history of Anorexia/bulimia, Confinement Anxiety Hospitalization/Surgery History - CHF fluid overload 10/20-10/25-2022 [facility]. - AV fistula placement right arm1/5/23. 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 Temperature is normal and within the target range for the patient.Marland Kitchen Appears in no distress. Vitals Time Taken: 11:17 AM, Height: 63 in, Source: Stated, Weight: 232 lbs, Source: Stated, BMI: 41.1, Temperature: 98.4 F, Pulse: 71 bpm, Respiratory Rate: 18  breaths/min, Blood Pressure: 131/73 mmHg, Capillary Blood Glucose: 134 mg/dl. General Notes: glucose per pt report General Notes: Wound exam; plantar left heel wound healthy granulated base. Thick areas of skin and subcutaneous tissue caved in around the wound margins which I removed with a #5 curette. Notable for the fact that she had a lot of bleeding requiring silver nitrate and a pressure dressing. There is no evidence of surrounding infection Integumentary (Hair, Skin) Wound #2 status is Open. Original cause of wound was Gradually Appeared. The date acquired was: 08/04/2021. The wound has been in treatment 15 weeks. The wound is located on the Left Calcaneus. The wound measures 2.9cm length x 3.2cm width x 1.2cm depth; 7.288cm^2 area and 8.746cm^3 volume. There is Fat Layer (Subcutaneous Tissue) exposed. There is no tunneling or undermining noted. There is a large amount of serosanguineous drainage noted. The wound margin is thickened. There is large (67-100%) red granulation within the wound bed. There is no necrotic tissue within the wound bed. Wound #3 status is Open. Original cause of wound was Gradually Appeared. The date acquired was: 08/04/2021. The wound has been in treatment 15 weeks. The wound is located on the Left,Lateral Foot. The wound measures 0cm length x 0cm width x 0cm depth; 0cm^2 area and 0cm^3 volume. There is no tunneling or undermining noted. There is a none present amount of drainage noted. There is no granulation within the wound bed. There is no necrotic tissue within the wound bed. 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 There was a Excisional Skin/Subcutaneous Tissue Debridement with a total area of 2.9 sq cm performed by Ricard Dillon., MD. With the following instrument(s): Curette to remove Viable and Non-Viable tissue/material. Material removed includes Subcutaneous Tissue, Slough, and Skin: Epidermis. No specimens were taken. A time out was conducted at 11:40, prior to the start of the procedure. A Minimum amount of bleeding was controlled with Pressure. The procedure was tolerated well with a pain level of 0 throughout and a pain level of 0 following the procedure. Post Debridement Measurements: 2.9cm length x 3.2cm width x 1.2cm depth; 8.746cm^3 volume. Character of Wound/Ulcer Post Debridement is improved. Severity of Tissue Post Debridement is: Fat layer exposed. Post procedure Diagnosis Wound #2: Same as Pre-Procedure Plan Follow-up Appointments: Return Appointment in 2 weeks. - with Dr. Dellia Nims and Royce Macadamia Shower/ Hygiene: May shower with protection but do not get wound dressing(s) wet. Edema Control - Lymphedema / SCD / Other: Elevate legs to the level of the heart or above for 30 minutes daily and/or when sitting, a frequency of: Avoid standing for long periods of time. Moisturize legs daily. Off-Loading: Heel suspension boot to: -  globoped heel offloading sandal to left foot Other: - Offloading boot to left foot; float heel/foot when sitting/laying down. Use pillow and place under your leg so your heel isnt laying against bed!!!! WOUND #2: - Calcaneus Wound Laterality: Left Cleanser: Normal Saline ([facility]) 1 x Per Day/30 Days Discharge Instructions: Cleanse the wound with Normal Saline prior to applying a clean dressing using gauze sponges, not tissue or cotton  balls. Cleanser: Soap and Water [facility]) 1 x Per Day/30 Days Discharge Instructions: May shower and wash wound with dial antibacterial soap and water prior to dressing change. Cleanser: Wound Cleanser (Home Health) 1 x Per Day/30 Days Discharge Instructions: Cleanse the wound with wound cleanser prior to applying a clean dressing using gauze sponges, not tissue or cotton balls. Prim Dressing: 1/4 strength Dakin's wet to dry (Home Health) 1 x Per Day/30 Days ary Discharge Instructions: moisten gauze with Dakin's and pack lightly into wound Secondary Dressing: Woven Gauze Sponge, Non-Sterile 4x4 in (Home Health) 1 x Per Day/30 Days Discharge Instructions: Apply over primary dressing as directed. Secondary Dressing: ABD Pad, 5x9 (Home Health) 1 x Per Day/30 Days Discharge Instructions: Apply over primary dressing as directed. Secured With: [employer], 4.5x3.1 (in/yd) (Home Health) 1 x Per Day/30 Days Discharge Instructions: Secure with Kerlix as directed. Secured With: 29M Medipore H Soft Cloth Surgical T ape, 4 x 10 (in/yd) (Home Health) 1 x Per Day/30 Days Discharge Instructions: Secure with tape as directed. 1. The patient actually looks better than the last time she was here. The area on the left lateral foot which was a substantive wound is actually closed. Plantar heel wound I remove the thick skin and subcutaneous tissue around the margins but did not really feel that the actual wound surface needed debridement. 2. I am a bit surprised about the result of her arterial studies however it appear that she probably has enough blood flow to heal this wound. 3. I am going to allow her husband to continue with the Dakin's wet-to-dry although I will see her in 2 weeks and if the wound is not contracting she will need an alternative dressing perhaps Hydrofera Blue to start. 4. I have also given her one of our standard heel offloading boots. I am hopeful she will be able to tolerate this.  She uses a cane. The boot she had looked as though it had seen better days. It does not sound that she is ambulating all that much in any case 5. No evidence of infection and her remaining wound I do not think any additional imaging studies are necessary for the moment. Her last MRI of the left foot was in September 22 at which time there was no evidence of osteomyelitis. Electronic Signature(s) Signed: 01/18/2022 4:50:25 PM By: Linton Ham MD Entered By: Linton Ham on 01/18/2022 12:43:29 -------------------------------------------------------------------------------- HxROS Details Patient Name: Date of Service: Nancy Nancy Aly H. 01/18/2022 11:00 A M Medical Record Number: [id number] Patient Account Number: [id number] Date of Birth/Sex: Treating RN: 04/23/1961 (61 y.o. [name] Primary Care Provider: [name] Other Clinician: Referring Provider: Treating Provider/Extender: Nyra Market in Treatment: 15 Unable to Obtain Patient History due to Altered Mental Status Information Obtained From Patient Caregiver Chart Eyes Medical History: Negative for: Cataracts; Glaucoma; Optic Neuritis Cardiovascular Medical History: Positive for: Congestive Heart Failure; Hypertension; Peripheral Arterial Disease Negative for: Angina; Arrhythmia; Coronary Artery Disease; Deep Vein Thrombosis; Hypotension; Myocardial Infarction; Peripheral Venous Disease; Phlebitis; Vasculitis Endocrine Medical  History: Positive for: Type II Diabetes Negative for: Type I Diabetes Genitourinary Medical History: Negative for: End Stage Renal Disease Past Medical History Notes: [facility] Integumentary (Skin) Medical History: Negative for: History of Burn Musculoskeletal Medical History: Negative for: Gout; Rheumatoid Arthritis; Osteoarthritis; Osteomyelitis Neurologic Medical History: Positive for: Dementia; Neuropathy Negative for: Quadriplegia;  Paraplegia; Seizure Disorder Psychiatric Medical History: Negative for: Anorexia/bulimia; Confinement Anxiety Past Medical History Notes: hallucinations with risperidone Immunizations Pneumococcal Vaccine: Received Pneumococcal Vaccination: Yes Received Pneumococcal Vaccination On or After 60th Birthday: Yes Implantable Devices None Hospitalization / Surgery History Type of Hospitalization/Surgery CHF fluid overload 10/20-10/25-2022 [place] AV fistula placement right arm1/5/23 Family and Social History Cancer: No; Diabetes: No; Heart Disease: Yes - Mother,Father; Hereditary Spherocytosis: No; Kidney Disease: No; Lung Disease: No; Never smoker; Marital Status - Married; Alcohol Use: Never; Drug Use: No History; Caffeine Use: Moderate; Financial Concerns: No; Food, Clothing or Shelter Needs: No; Support System Lacking: No; Transportation Concerns: No Engineer, maintenance) Signed: 01/18/2022 4:50:25 PM By: Linton Ham MD Signed: 01/18/2022 6:25:59 PM By: Baruch Gouty RN, BSN Entered By: Baruch Gouty on 01/18/2022 11:28:46 -------------------------------------------------------------------------------- [facility] Details Patient Name: Date of Service: Nancy [name] 01/18/2022 Medical Record Number: [id number] Patient Account Number: [id number] Date of Birth/Sex: Treating RN: 1961/05/05 (61 y.o. 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 L97.528 Non-pressure chronic ulcer of other part of left foot with other specified severity I12.0 Hypertensive chronic  kidney disease with stage 5 chronic kidney disease or end stage renal disease Facility Procedures CPT4 Code: [id number] Description: 27062 - DEB SUBQ TISSUE 20 SQ CM/< ICD-10 Diagnosis Description L97.428 Non-pressure chronic ulcer of left heel and midfoot with other specified seve Modifier: rity Quantity: 1 Physician Procedures : CPT4 Code Description Modifier [id number] - WC PHYS SUBQ TISS 20 SQ CM ICD-10 Diagnosis Description L97.428 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

## 2022-01-18 NOTE — Progress Notes (Signed)
EDOM, SCHMUHL ([id number]) Visit Report for 01/18/2022 Arrival Information Details Patient Name: Date of Service: Nancy [name] 01/18/2022 11:00 A M Medical Record Number: [id number] Patient Account Number: [id number] Date of Birth/Sex: Treating RN: 22-Apr-1961 (61 y.o. [name] Primary Care Naviah Belfield: [name] Other Clinician: Referring Barney Gertsch: Treating Samyak Sackmann/Extender: Nyra Market in Treatment: 15 Visit Information History Since Last Visit Added or deleted any medications: No Patient Arrived: Wheel Chair Any new allergies or adverse reactions: No Arrival Time: 11:11 Had a fall or experienced change in No Accompanied By: spouse activities of daily living that may affect Transfer Assistance: Manual risk of falls: Patient Identification Verified: Yes Signs or symptoms of abuse/neglect since last visito No Secondary Verification Process Completed: Yes Hospitalized since last visit: Yes Patient Requires Transmission-Based No Has Dressing in Place as Prescribed: Yes Precautions: Has Footwear/Offloading in Place as Prescribed: Yes Patient Has Alerts: Yes Left: Multipodus Split/Boot Patient Alerts: ABI's: 09/22 R:0.45 L0.5 Pain Present Now: No ABIs 01/05/22 R=.63, L=.97 Electronic Signature(s) Signed: 01/18/2022 6:25:59 PM By: Baruch Gouty RN, BSN Entered By: Baruch Gouty on 01/18/2022 12:11:37 -------------------------------------------------------------------------------- Encounter Discharge Information Details Patient Name: Date of Service: Nancy [place]. 01/18/2022 11:00 A M Medical Record Number: [id number] Patient Account Number: [id number] Date of Birth/Sex: Treating RN: 10-18-1961 (61 y.o. [name] Primary Care Johathon Overturf: [name] Other Clinician: Referring Abriel Geesey: Treating Ascencion Stegner/Extender: Nyra Market in Treatment: 15 Encounter Discharge  Information Items Post Procedure Vitals Discharge Condition: Stable Temperature (F): 98.4 Ambulatory Status: Wheelchair Pulse (bpm): 71 Discharge Destination: Home Respiratory Rate (breaths/min): 18 Transportation: Private Auto Blood Pressure (mmHg): 131/73 Accompanied By: spouse Schedule Follow-up Appointment: Yes Clinical Summary of Care: Patient Declined Electronic Signature(s) Signed: 01/18/2022 6:25:59 PM By: Baruch Gouty RN, BSN Entered By: Baruch Gouty on 01/18/2022 12:18:08 -------------------------------------------------------------------------------- Lower Extremity Assessment Details Patient Name: Date of Service: Nancy Faythe Ghee NICA H. 01/18/2022 11:00 A M Medical Record Number: [id number] Patient Account Number: [id number] Date of Birth/Sex: Treating RN: [birth year] (61 y.o. [name] Primary Care Mariselda Badalamenti: [name] Other Clinician: Referring Jaedin Regina: Treating Dock Baccam/Extender: Nyra Market in Treatment: 15 Edema Assessment Assessed: Shirlyn Goltz: No] Patrice Paradise: No] Edema: [Left: Ye] [Right: s] Calf Left: Right: Point of Measurement: 33 cm From Medial Instep 40 cm Ankle Left: Right: Point of Measurement: 10 cm From Medial Instep 28 cm Vascular Assessment Pulses: Dorsalis Pedis Palpable: [Left:No] Electronic Signature(s) Signed: 01/18/2022 6:25:59 PM By: Baruch Gouty RN, BSN Entered By: Baruch Gouty on 01/18/2022 11:21:30 -------------------------------------------------------------------------------- Multi Wound Chart Details Patient Name: Date of Service: Nancy [name] 01/18/2022 11:00 A M Medical Record Number: [id number] Patient Account Number: [id number] Date of Birth/Sex: Treating RN: 04-04-1961 (61 y.o. Martyn Malay, Linda Primary Care Alanta Scobey: [name] Other Clinician: Referring Taariq Leitz: Treating Murl Zogg/Extender: Nyra Market in Treatment: 15 Vital  Signs Height(in): 63 Capillary Blood Glucose(mg/dl): 134 Weight(lbs): 232 Pulse(bpm): 39 Body Mass Index(BMI): 41.1 Blood Pressure(mmHg): 131/73 Temperature(F): 98.4 Respiratory Rate(breaths/min): 18 Photos: [2:Left Calcaneus] [3:Left, Lateral Foot] [N/A:N/A N/A] Wound Location: [2:Gradually Appeared] [3:Gradually Appeared] [N/A:N/A] Wounding Event: [2:Diabetic Wound/Ulcer of the Lower] [3:Diabetic Wound/Ulcer of the Lower] [N/A:N/A] Primary Etiology: [2:Extremity Congestive Heart Failure,] [3:Extremity Congestive Heart Failure,] [N/A:N/A] Comorbid History: [2:Hypertension, Peripheral Arterial Disease, Type II Diabetes, Dementia, Disease, Type II Diabetes, Dementia, Neuropathy 08/04/2021] [3:Hypertension, Peripheral Arterial Neuropathy 08/04/2021] [N/A:N/A] Date Acquired: [2:15] [3:15] [N/A:N/A] Weeks of Treatment: [2:Open] [3:Open] [N/A:N/A] Wound  Status: [2:No] [3:No] [N/A:N/A] Wound Recurrence: [2:2.9x3.2x1.2] [3:0x0x0] [N/A:N/A] Measurements L x W x D (cm) [2:7.288] [3:0] [N/A:N/A] A (cm) : rea [2:8.746] [3:0] [N/A:N/A] Volume (cm) : [2:68.00%] [3:100.00%] [N/A:N/A] % Reduction in A [2:rea: -283.90%] [3:100.00%] [N/A:N/A] % Reduction in Volume: [2:Grade 2] [3:Grade 1] [N/A:N/A] Classification: [2:Large] [3:None Present] [N/A:N/A] Exudate A mount: [2:Serosanguineous] [3:N/A] [N/A:N/A] Exudate Type: [2:red, brown] [3:N/A] [N/A:N/A] Exudate Color: [2:Thickened] [3:N/A] [N/A:N/A] Wound Margin: [2:Large (67-100%)] [3:None Present (0%)] [N/A:N/A] Granulation A mount: [2:Red] [3:N/A] [N/A:N/A] Granulation Quality: [2:None Present (0%)] [3:None Present (0%)] [N/A:N/A] Necrotic A mount: [2:Fat Layer (Subcutaneous Tissue): Yes Fascia: No] [N/A:N/A] Exposed Structures: [2:Fascia: No Tendon: No Muscle: No Joint: No Bone: No Small (1-33%)] [3:Fat Layer (Subcutaneous Tissue): No Tendon: No Muscle: No Joint: No Bone: No Large (67-100%)] [N/A:N/A] Epithelialization: [2:Debridement -  Excisional] [3:N/A] [N/A:N/A] Debridement: Pre-procedure Verification/Time Out 11:40 [3:N/A] [N/A:N/A] Taken: [2:Subcutaneous, Slough] [3:N/A] [N/A:N/A] Tissue Debrided: [2:Skin/Subcutaneous Tissue] [3:N/A] [N/A:N/A] Level: [2:2.9] [3:N/A] [N/A:N/A] Debridement A (sq cm): [2:rea Curette] [3:N/A] [N/A:N/A] Instrument: [2:Minimum] [3:N/A] [N/A:N/A] Bleeding: [2:Pressure] [3:N/A] [N/A:N/A] Hemostasis A chieved: [2:0] [3:N/A] [N/A:N/A] Procedural Pain: [2:0] [3:N/A] [N/A:N/A] Post Procedural Pain: [2:Procedure was tolerated well] [3:N/A] [N/A:N/A] Debridement Treatment Response: [2:2.9x3.2x1.2] [3:N/A] [N/A:N/A] Post Debridement Measurements L x W x D (cm) [2:8.746] [3:N/A] [N/A:N/A] Post Debridement Volume: (cm) [2:Debridement] [3:N/A] [N/A:N/A] Treatment Notes Wound #2 (Calcaneus) Wound Laterality: Left Cleanser Normal Saline Discharge Instruction: Cleanse the wound with Normal Saline prior to applying a clean dressing using gauze sponges, not tissue or cotton balls. Soap and Water Discharge Instruction: May shower and wash wound with dial antibacterial soap and water prior to dressing change. Wound Cleanser Discharge Instruction: Cleanse the wound with wound cleanser prior to applying a clean dressing using gauze sponges, not tissue or cotton balls. Peri-Wound Care Topical Primary Dressing 1/4 strength Dakin's wet to dry Discharge Instruction: moisten gauze with Dakin's and pack lightly into wound Secondary Dressing Woven Gauze Sponge, Non-Sterile 4x4 in Discharge Instruction: Apply over primary dressing as directed. ABD Pad, 5x9 Discharge Instruction: Apply over primary dressing as directed. Secured With [employer], 4.5x3.1 (in/yd) Discharge Instruction: Secure with Kerlix as directed. 80M Medipore H Soft Cloth Surgical Tape, 4 x 10 (in/yd) Discharge Instruction: Secure with tape as directed. Compression Wrap Compression Stockings Add-Ons Wound #3 (Foot) Wound  Laterality: Left, Lateral Cleanser Peri-Wound Care Topical Primary Dressing Secondary Dressing Secured With Compression Wrap Compression Stockings Add-Ons Electronic Signature(s) Signed: 01/18/2022 4:50:25 PM By: Linton Ham MD Signed: 01/18/2022 6:25:59 PM By: Baruch Gouty RN, BSN Entered By: Linton Ham on 01/18/2022 12:32:26 -------------------------------------------------------------------------------- [place] Details Patient Name: Date of Service: Nancy [address] NICA H. 01/18/2022 11:00 A M Medical Record Number: [id number] Patient Account Number: [id number] Date of Birth/Sex: Treating RN: December 11, 1961 (60 y.o. [name] Primary Care Daanish Copes: [name] Other Clinician: Referring Deontrae Drinkard: Treating Laurence Folz/Extender: Nyra Market in Treatment: 15 Multidisciplinary Care Plan reviewed with physician Active Inactive Nutrition Nursing Diagnoses: Impaired glucose control: actual or potential Potential for alteratiion in Nutrition/Potential for imbalanced nutrition Goals: Patient/caregiver will maintain therapeutic glucose control Date Initiated: 01/18/2022 Target Resolution Date: 02/15/2022 Goal Status: Active Interventions: Assess HgA1c results as ordered upon admission and as needed Assess patient nutrition upon admission and as needed per policy Provide education on elevated blood sugars and impact on wound healing Treatment Activities: Patient referred to Primary Care Physician for further nutritional evaluation : 01/18/2022 Notes: Wound/Skin Impairment Nursing Diagnoses: Impaired tissue integrity Knowledge deficit related to ulceration/compromised skin integrity Goals:  Patient will have a decrease in wound volume by X% from date: (specify in notes) Date Initiated: 10/04/2021 Target Resolution Date: 12/15/2021 Goal Status: Active Patient/caregiver will verbalize understanding of skin care  regimen Date Initiated: 10/04/2021 Target Resolution Date: 12/08/2021 Goal Status: Active Ulcer/skin breakdown will have a volume reduction of 30% by week 4 Date Initiated: 10/04/2021 Target Resolution Date: 12/14/2021 Goal Status: Active Interventions: Assess patient/caregiver ability to obtain necessary supplies Assess patient/caregiver ability to perform ulcer/skin care regimen upon admission and as needed Assess ulceration(s) every visit Notes: Electronic Signature(s) Signed: 01/18/2022 6:25:59 PM By: Baruch Gouty RN, BSN Entered By: Baruch Gouty on 01/18/2022 11:26:18 -------------------------------------------------------------------------------- Pain Assessment Details Patient Name: Date of Service: Nancy [place]. 01/18/2022 11:00 A M Medical Record Number: [id number] Patient Account Number: [id number] Date of Birth/Sex: Treating RN: 1961-06-02 (61 y.o. [name] Primary Care Anastacia Reinecke: [name] Other Clinician: Referring Tannia Contino: Treating Borghild Thaker/Extender: Nyra Market in Treatment: 15 Active Problems Location of Pain Severity and Description of Pain Patient Has Paino No Site Locations Rate the pain. Current Pain Level: 0 Pain Management and Medication Current Pain Management: Electronic Signature(s) Signed: 01/18/2022 6:25:59 PM By: Baruch Gouty RN, BSN Entered By: Baruch Gouty on 01/18/2022 11:18:24 -------------------------------------------------------------------------------- Patient/Caregiver Education Details Patient Name: Date of Service: Nancy [place]. 2/2/2023andnbsp11:00 [place] Record Number: [id number] Patient Account Number: [id number] Date of Birth/Gender: Treating RN: [birth year] (61 y.o. [name] Primary Care Physician: [name] Other Clinician: Referring Physician: Treating Physician/Extender: Nyra Market in Treatment:  15 Education Assessment Education Provided To: Patient Education Topics Provided Elevated Blood Sugar/ Impact on Healing: Methods: Explain/Verbal Responses: Reinforcements needed, State content correctly Wound/Skin Impairment: Methods: Explain/Verbal Responses: Reinforcements needed, State content correctly Electronic Signature(s) Signed: 01/18/2022 6:25:59 PM By: Baruch Gouty RN, BSN Entered By: Baruch Gouty on 01/18/2022 11:27:04 -------------------------------------------------------------------------------- Wound Assessment Details Patient Name: Date of Service: Nancy [name] 01/18/2022 11:00 A M Medical Record Number: [id number] Patient Account Number: [id number] Date of Birth/Sex: Treating RN: Aug 22, 1961 (61 y.o. [name] Primary Care Jonisha Kindig: [name] Other Clinician: Referring Naileah Karg: Treating Aliza Moret/Extender: Nyra Market in Treatment: 15 Wound Status Wound Number: 2 Primary Diabetic Wound/Ulcer of the Lower Extremity Etiology: Wound Location: Left Calcaneus Wound Open Wounding Event: Gradually Appeared Status: Date Acquired: 08/04/2021 Comorbid Congestive Heart Failure, Hypertension, Peripheral Arterial Weeks Of Treatment: 15 History: Disease, Type II Diabetes, Dementia, Neuropathy Clustered Wound: No Photos Wound Measurements Length: (cm) 2.9 Width: (cm) 3.2 Depth: (cm) 1.2 Area: (cm) 7.288 Volume: (cm) 8.746 % Reduction in Area: 68% % Reduction in Volume: -283.9% Epithelialization: Small (1-33%) Tunneling: No Undermining: No Wound Description Classification: Grade 2 Wound Margin: Thickened Exudate Amount: Large Exudate Type: Serosanguineous Exudate Color: red, brown Foul Odor After Cleansing: No Slough/Fibrino Yes Wound Bed Granulation Amount: Large (67-100%) Exposed Structure Granulation Quality: Red Fascia Exposed: No Necrotic Amount: None Present (0%) Fat Layer (Subcutaneous  Tissue) Exposed: Yes Tendon Exposed: No Muscle Exposed: No Joint Exposed: No Bone Exposed: No Treatment Notes Wound #2 (Calcaneus) Wound Laterality: Left Cleanser Normal Saline Discharge Instruction: Cleanse the wound with Normal Saline prior to applying a clean dressing using gauze sponges, not tissue or cotton balls. Soap and Water Discharge Instruction: May shower and wash wound with dial antibacterial soap and water prior to dressing change. Wound Cleanser Discharge Instruction: Cleanse the wound with wound cleanser prior to applying a clean dressing using gauze  sponges, not tissue or cotton balls. Peri-Wound Care Topical Primary Dressing 1/4 strength Dakin's wet to dry Discharge Instruction: moisten gauze with Dakin's and pack lightly into wound Secondary Dressing Woven Gauze Sponge, Non-Sterile 4x4 in Discharge Instruction: Apply over primary dressing as directed. ABD Pad, 5x9 Discharge Instruction: Apply over primary dressing as directed. Secured With [employer], 4.5x3.1 (in/yd) Discharge Instruction: Secure with Kerlix as directed. 18M Medipore H Soft Cloth Surgical T ape, 4 x 10 (in/yd) Discharge Instruction: Secure with tape as directed. Compression Wrap Compression Stockings Add-Ons Electronic Signature(s) Signed: 01/18/2022 6:25:59 PM By: Baruch Gouty RN, BSN Entered By: Baruch Gouty on 01/18/2022 11:25:25 -------------------------------------------------------------------------------- Wound Assessment Details Patient Name: Date of Service: Nancy [name] 01/18/2022 11:00 A M Medical Record Number: [id number] Patient Account Number: [id number] Date of Birth/Sex: Treating RN: 1961/03/15 (61 y.o. [name] Primary Care Silvina Hackleman: [name] Other Clinician: Referring Keandre Linden: Treating Prosperity Darrough/Extender: Nyra Market in Treatment: 15 Wound Status Wound Number: 3 Primary Diabetic Wound/Ulcer of the  Lower Extremity Etiology: Wound Location: Left, Lateral Foot Wound Open Wounding Event: Gradually Appeared Status: Date Acquired: 08/04/2021 Comorbid Congestive Heart Failure, Hypertension, Peripheral Arterial Weeks Of Treatment: 15 History: Disease, Type II Diabetes, Dementia, Neuropathy Clustered Wound: No Photos Wound Measurements Length: (cm) Width: (cm) Depth: (cm) Area: (cm) Volume: (cm) 0 % Reduction in Area: 100% 0 % Reduction in Volume: 100% 0 Epithelialization: Large (67-100%) 0 Tunneling: No 0 Undermining: No Wound Description Classification: Grade 1 Exudate Amount: None Present Foul Odor After Cleansing: No Slough/Fibrino Yes Wound Bed Granulation Amount: None Present (0%) Exposed Structure Necrotic Amount: None Present (0%) Fascia Exposed: No Fat Layer (Subcutaneous Tissue) Exposed: No Tendon Exposed: No Muscle Exposed: No Joint Exposed: No Bone Exposed: No Electronic Signature(s) Signed: 01/18/2022 1:34:24 PM By: Sandre Kitty Signed: 01/18/2022 6:25:59 PM By: Baruch Gouty RN, BSN Entered By: Sandre Kitty on 01/18/2022 11:23:15 -------------------------------------------------------------------------------- Vitals Details Patient Name: Date of Service: Nancy [name] 01/18/2022 11:00 A M Medical Record Number: [id number] Patient Account Number: [id number] Date of Birth/Sex: Treating RN: April 12, 1961 (61 y.o. [name] Primary Care Jazmene Racz: [name] Other Clinician: Referring Jariah Jarmon: Treating Meleena Munroe/Extender: Nyra Market in Treatment: 15 Vital Signs Time Taken: 11:17 Temperature (F): 98.4 Height (in): 63 Pulse (bpm): 71 Source: Stated Respiratory Rate (breaths/min): 18 Weight (lbs): 232 Blood Pressure (mmHg): 131/73 Source: Stated Capillary Blood Glucose (mg/dl): 134 Body Mass Index (BMI): 41.1 Reference Range: 80 - 120 mg / dl Notes glucose per pt report Electronic  Signature(s) Signed: 01/18/2022 6:25:59 PM By: Baruch Gouty RN, BSN Entered By: Baruch Gouty on 01/18/2022 11:18:03

## 2022-01-19 ENCOUNTER — Ambulatory Visit (HOSPITAL_COMMUNITY)
Admission: RE | Admit: 2022-01-19 | Discharge: 2022-01-19 | Disposition: A | Discharge status: Home / Self Care | Payer: Medicaid Other | Source: Ambulatory Visit | Attending: Cardiology | Admitting: Cardiology

## 2022-01-19 DIAGNOSIS — I5022 Chronic systolic (congestive) heart failure: Secondary | ICD-10-CM | POA: Diagnosis present

## 2022-01-19 LAB — BASIC METABOLIC PANEL
Anion gap: 12 (ref 5–15)
BUN: 61 mg/dL — ABNORMAL HIGH (ref 8–23)
CO2: 27 mmol/L (ref 22–32)
Calcium: 9.1 mg/dL (ref 8.9–10.3)
Chloride: 95 mmol/L — ABNORMAL LOW (ref 98–111)
Creatinine, Ser: 5.31 mg/dL — ABNORMAL HIGH (ref 0.44–1.00)
GFR, Estimated: 9 mL/min — ABNORMAL LOW (ref >60)
Glucose, Bld: 153 mg/dL — ABNORMAL HIGH (ref 70–99)
Potassium: 4.3 mmol/L (ref 3.5–5.1)
Sodium: 134 mmol/L — ABNORMAL LOW (ref 135–145)

## 2022-01-23 NOTE — Progress Notes (Signed)
Advanced Heart Failure Clinic Note   PCP: [name], MD PCP-Cardiologist: Quay Burow, MD  HF Cardiologist: Dr. Aundra Dubin  HPI: Nancy [name] 61 y.o. AAF w/ chronic diastolic heart failure, Stage IV CKD (baseline SCr ~2-3), HTN, T2DM, HLD, CVA, PVD, carotid stenosis, and tobacco abuse.   Echo 11/2019 showed EF 55-60%, RV normal. No LVH. No significant valvular dysfunction   She was referred to Dr. Gwenlyn Found for [facility] by PCP in April 2022. Complained of progressive exertional dyspnea, LEE and orthopnea. Echo 4/22 with EF 60-65%, GIIDD, mild LVH, RV normal. Cardiolite showed findings suggestive of possible anterior ischemia vs breast attenuation. R/LHC on 04/27/21 showed mild nonobstructive CAD and RHC hemodynamics c/w pulmonary HTN. Diuretics were increased w/ plans to refer to the [facility]. Dry wt felt to be ~228 lb.   Admitted 5/24-5/27/22=> discharged home on Lasix 40 daily.   Admitted 6/25-6/30/22 =>SCr bump to 3.2 (2.9 at d/c). Discharged home on Torsemide 40 daily. Echo EF 55-60%, RV normal.   Admitted 07/07/21= > direct admit from Dr. Kennon Holter office w/ marked fluid overload, progressive wt gain, exertional dyspnea, LEE and orthopnea. Clinic BP elevated 172/85. SCr 2.6 on admit. Started on lasix gtt. AHF team consulted to assist with further management. She underwent RHC, suggested ongoing volume overload with elevated PCWP and RA.  Prominent v-waves in PCWP but think due to severe diastolic dysfunction as there was no significant MR on review of echo.  PYP study in 7/22 was not suggestive of TTR cardiac amyloidosis.  Suspect severe diastolic dysfunction due to long-standing and poorly controlled HTN. SCr and potassium continued to climb and nephrology consulted. Her diuretics were adjusted and she was able to be discharged on low dose carvedilol and torsemide 40 daily; weight 226 lbs.  Admitted 09/04/21 with a left heel ulcer secondary to diabetic neuropathy.  Work-up included right ABI  of 0.45 and left ABI of 0.5.  She was started on antibiotics, no osteomyelitis on MRI. Vascular consulted and did not pursue angiography as it could potentially push her into renal failure and require initiation of HD. Ortho recommended left transtibial amputation  but she declined. Palliative care met with her but she did not want to discuss her health issues. There was no indication for HD, per nephrology, but AV fistula was recommended. She was not interested in this. PT recommended SNF however patient declined. She was discharged with Home Health.   Admitted 10/22 w/ acute on chronic HFpEF exacerbation w/ acute pulmonary edema in setting of hypertensive urgency. Acute exacerbation felt triggered by poor med compliance and dietary indiscretion w/ sodium + high fluid intake. Echo in 10/22 showed EF 50-55%, mod LVH, G2DD. Course c/b worsening CKD. SCr bumped to 3.12 (prior baseline ~ 2.5). Nephrology was consulted but felt no emergent indication for HD. She responded well to IV Lasix, though nephrology anticipated that she may be nearing need for HD soon. Plan is to follow in the outpatient setting. Also noted to have gangrenous pressure ulcers of bilateral heels left and right dorsal feet. Now followed by wound care. Discharged home on torsemide 60 mg daily. D/c SCr 3.12. D/c wt 231 lb.   Admitted 11/22 with A/C CHF exacerbation and left foot osteomyelitis. She was diuresed with IV lasix and transitioned to po. GDMT limited by CKD IV. Ortho surgery again recommended amputation, she continued to decline. She was also noted to have wound on right metatarsal, XR showed no osteomyelitis. She received bedside debridement and wound care to left foot.  She was discharged home with plans for on-going wound care, weight 245 lbs.  Received Lasix 80 mg IV + metolazone 2.5 at home for volume overload 12/13/21 via Paramedicine. No improvement in symptoms and she was advised to go to ED. Received further IV Lasix and felt  better. Discharged home.   Admitted 12/22/21 for respiratory distress requiring intubation after her AVF creation surgery.  Peripheral arterial dopplers in 1/23 with moderate right and severe left SFA stenosis.    Today she returns for HF follow up with her husband. She is able to get around her house without marked dyspnea, but does not do much more than this. She continues with daily dressing changes to LLE wound and wears a boot. Dr. Gwenlyn Found has seen, is considering intervention on left leg (see peripheral arterial dopplers above).  Continues with significant LE edema. Denies CP, dizziness,or PND/Orthopnea. Appetite ok. No fever or chills. Weight at home better at 234 lbs. Taking all medications. Followed by Paramedicine Nira Conn).  ECG (personally reviewed): none ordered today.  Labs (1/23): K 3.6, creatinine 4.27 Labs (2/23): K 4.3, creatinine 5.31  PMH:  1. CKD stage IV 2. Chronic diastolic CHF: PYP scan (8/28).  - RHC (7/22): mean RA 13, PA 68/22 mean 42, mean PCWP 25, CI 2.65, PVR 3.1. - Echo (10/22): EF 50-55%, moderate LVH, normal RV size and systolic function, normal IVC.  3. HTN 4. Peripheral arterial disease:  - Peripheral arterial dopplers (1/23): right 50-74% distal SFA, left 75-99% SFA, bilateral tibial disease.  5. Carotid stenosis: Dopplers (5/22) with totally occluded RICA, 00-34% LICA.  6. Former smoker.  7. Type 2 diabetes 8. CAD: LHC (5/22) with 50% mLAD.  9. CVA: 2020.  10. Hyperlipidemia 11. Depression  ROS: All systems reviewed and negative except as per HPI.   Current Outpatient Medications  Medication Sig Dispense Refill   acetaminophen (TYLENOL) 500 MG tablet Take 500 mg by mouth every 6 (six) hours as needed for moderate pain or headache.     amLODipine (NORVASC) 10 MG tablet Take 1 tablet (10 mg total) by mouth daily. 30 tablet 6   aspirin 81 MG EC tablet Take 1 tablet (81 mg total) by mouth daily. 60 tablet 1   atorvastatin (LIPITOR) 40 MG tablet Take 1  tablet (40 mg total) by mouth daily. 30 tablet 6   Blood Glucose Monitoring Suppl (ONETOUCH VERIO) w/Device KIT Check blood sugar three times daily. E11.40 1 kit 0   calcium acetate (PHOSLO) 667 MG capsule Take 1 capsule (667 mg total) by mouth 3 (three) times daily with meals. 90 capsule 3   carvedilol (COREG) 12.5 MG tablet Take 1.5 tablets (18.75 mg total) by mouth 2 (two) times daily with a meal. 90 tablet 6   Continuous Blood Gluc Receiver (DEXCOM G6 RECEIVER) DEVI 1 Device by Does not apply route daily. 1 each 0   Continuous Blood Gluc Sensor (DEXCOM G6 SENSOR) MISC 1 packet by Does not apply route daily. 3 each 11   Continuous Blood Gluc Transmit (DEXCOM G6 TRANSMITTER) MISC 1 packet by Does not apply route daily. 1 each 4   ferrous sulfate 325 (65 FE) MG tablet Take 1 tablet (325 mg total) by mouth daily with breakfast. 100 tablet 1   glucose blood test strip Use as instructedCheck blood sugar three times daily. E11.40 100 each 12   insulin glargine (LANTUS) 100 UNIT/ML Solostar Pen Inject 30 Units into the skin at bedtime. 15 mL 3   insulin lispro (HUMALOG  KWIKPEN) 100 UNIT/ML KwikPen Inject 4 units before breakfast and dinner. 15 mL 3   Insulin Pen Needle 32G X 4 MM MISC Use to inject insulin as directed. 100 each 4   metolazone (ZAROXOLYN) 2.5 MG tablet Take 1 tablet (2.5 mg total) by mouth 2 (two) times a week on Monday and Friday. 10 tablet 3   potassium chloride SA (KLOR-CON M) 20 MEQ tablet Take 2 tablets (40 mEq total) by mouth 2 (two) times a week, on Monday and Friday with Metolazone. 30 tablet 0   risperiDONE (RISPERDAL) 3 MG tablet TAKE 1 TABLET (3 MG TOTAL) BY MOUTH AT BEDTIME. 30 tablet 2   torsemide (DEMADEX) 20 MG tablet Take 5 tablets (100 mg total) by mouth 2 (two) times daily. 150 tablet 6   No current facility-administered medications for this encounter.   Allergies  Allergen Reactions   Hydralazine Hcl Other (See Comments)    Hair loss   Hydrocodone Itching and  Other (See Comments)    Upset stomach   Metformin And Related Nausea And Vomiting and Other (See Comments)    Stomach pains, also   Other Nausea Only and Other (See Comments)    Lettuce- Does not digest this!!   Plaquenil [Hydroxychloroquine Sulfate] Hives   Shellfish-Derived Products Nausea Only and Other (See Comments)    Caused an upset stomach   Shrimp (Diagnostic) Nausea Only and Other (See Comments)    Upset stomach    Sulfa Antibiotics Hives   Social History   Socioeconomic History   Marital status: Legally Separated    Spouse name: [name]   Number of children: 1   Years of education: some colle   Highest education level: Not on file  Occupational History   Occupation: Employed FT as Landscape architect    Comment: [employer] , NA  Tobacco Use   Smoking status: Former    Packs/day: 0.25    Years: 0.50    Pack years: 0.13    Types: Cigarettes   Smokeless tobacco: Never   Tobacco comments:    Former smoker 11/03/21  Vaping Use   Vaping Use: Never used  Substance and Sexual Activity   Alcohol use: No   Drug use: No   Sexual activity: Yes    Birth control/protection: None  Other Topics Concern   Not on file  Social History Narrative   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    Social Determinants of Health   Financial Resource Strain: Low Risk    Difficulty of Paying Living Expenses: Not very hard  Food Insecurity: No Food Insecurity   Worried About Charity fundraiser in the Last Year: Never true   Ran Out of Food in the Last Year: Never true  Transportation Needs: No Transportation Needs   Lack of Transportation (Medical): No   Lack of Transportation (Non-Medical): No  Physical Activity: Not on file  Stress: Not on file  Social Connections: Not on file  Intimate Partner Violence: Not on file   Family History  Problem Relation Age of Onset   Hypertension Mother    Heart disease Mother    Diabetes Mother    Thyroid disease  Mother    Congestive Heart Failure Mother    Breast cancer Maternal Grandmother    Colon cancer Maternal Grandfather    Heart attack Sister    Heart disease Brother    Hyperlipidemia Brother    Hypertension Brother    Diabetes Father  Breast cancer Maternal Aunt    BP 128/78    Pulse 72    Wt 107 kg (236 lb)    SpO2 95%    BMI 41.81 kg/m   Wt Readings from Last 3 Encounters:  01/26/22 107 kg (236 lb)  01/24/22 103 kg (227 lb)  01/17/22 101.6 kg (224 lb)   General:  NAD. No resp difficulty HEENT: Normal Neck: Supple. JVP 10-12. Carotids 2+ bilat; no bruits. No lymphadenopathy or thryomegaly appreciated. Cor: PMI nondisplaced. Regular rate & rhythm. No rubs, gallops or murmurs. Lungs: Clear Abdomen: Obese, nontender, nondistended. No hepatosplenomegaly. No bruits or masses. Good bowel sounds. Extremities: No cyanosis, clubbing, rash, 2-3+ BLE edema to knees; left foot in ortho boot. Neuro: Alert & oriented x 3, cranial nerves grossly intact. Moves all 4 extremities w/o difficulty. Flat pleasant.  ASSESSMENT & PLAN: 1. Acute on chronic diastolic CHF: Echo in 7/94 showed EF 55-60%, moderate LVH, normal RV, dilated IVC.  R/LHC in 5/22 showed nonobstructive CAD, pulmonary hypertension .  Multiple admissions recently with CHF, complicated by CKD stage IV.  [place] in 7/22 suggested ongoing volume overload with elevated PCWP and RA.  Prominent v-waves in PCWP but think due to severe diastolic dysfunction as there is no significant MR on review of recent echo.  PYP study in 7/22 was not suggestive of TTR cardiac amyloidosis.  Suspect severe diastolic dysfunction due to long-standing and poorly controlled HTN.  Admit for diastolic CHF 80/16 & 55/37 w/ fluid overload, c/b CKD. NYHA 3-4, functional class difficult due to LLE wound and deconditioning. She is volume overloaded today.  Management limited by CKD IV.  Suspect she is nearing ESRD.  - Continue torsemide to 100 mg bid.  - Increase  metolazone to three times weekly, on Monday, Wednesday and Friday. She will take KCl 40 with each metolazone dose. BMET/BNP today, repeat BMET in 10 days. - Continue with paramedicine. 2. CAD: LHC on 5/22, she has mild to moderate segmental proximal mid and distal LAD disease. Her circumflex and RCA did not have significant disease.  No chest pain.  - Continue ASA - Continue atorvastatin 40 mg daily, LDL102 (1/23). 3. CKD stage IV: Renal US with no evidence of obstruction. Followed by nephrology. Now s/p AVF creation. She sees Dr. Hollie Salk 02/22/22. Likely nearing HD soon. 4. PAD: Now with bilateral gangrenous pressure ulcers, L >R. Evaluated by vascular and orthopedic surgery during recent admissions, recommend transtibial amputation however patient declined this, opting for non-surgical healing.  Peripheral arterial dopplers in 1/23 with moderate SFA stenosis on right and severe on left, she has bilateral tibial disease.  - To followup with Dr. Gwenlyn Found to evaluate for peripheral intervention.  Concern about giving her contrast load with advanced CKD, but she is likely to progress to HD soon regardless of contrast bolus.  - Seen at [place], wounds healing per patient and husband. - Continue aspirin + statin. - Continue offloading heels, has boot on left foot.  5. HLD: LDL 102 1/23. 6. HTN: Controlled. - Continue carvedilol 18.75 mg bid. - Continue amlodipine 10 mg daily. 7. Carotid disease: Occluded R ICA, moderate L ICA. - Continue ASA, statin.  Follow up in 4 weeks with APP and 12 weeks with Dr. Wynema Birch [facility] FNP 01/26/2022 12:15 PM]

## 2022-01-24 ENCOUNTER — Telehealth (HOSPITAL_COMMUNITY): Payer: Self-pay | Admitting: Surgery

## 2022-01-24 ENCOUNTER — Telehealth: Payer: Self-pay

## 2022-01-24 ENCOUNTER — Other Ambulatory Visit: Payer: Self-pay

## 2022-01-24 ENCOUNTER — Other Ambulatory Visit (HOSPITAL_COMMUNITY): Payer: Self-pay

## 2022-01-24 NOTE — Progress Notes (Signed)
Paramedicine Encounter    Patient ID: Nancy [name], female    DOB: 1961-11-02, 61 y.o.   MRN: [id number]  Arrived for home visit for [name] where she was seated in her recliner alert and oriented. Nancy [name] was noted to have some lower leg swelling, no more than her normal. She received a new ortho boot on the left foot. She reports this is more comfortable for her.   Vitals and assessment obtained.  Dexcom replaced today.  New sensors ordered at [facility] but it is needing PA. I will message [place].   Appointments reviewed. Nancy [name] reports needing to reschedule the [facility] on 2/15 due to [facility] coming out for eval. We called and rescheduled to their next aval. In March.   Meds reviewed and confirmed. Pill box filled accordingly.   Refills: Amlodipine- to be delivered   I reminded Nancy [name] the importance of lessening her sodium and sugar in take, but her and her husband are eating mostly prepackaged and fast foods. I will see about [facility] referral for them.   Home visit complete. I will see Nancy [name] in one week.   Patient Care Team: [name], MD as PCP - General (Internal Medicine) [name], MD as PCP - Cardiology (Cardiology) Larey Dresser, MD as PCP - Advanced Heart Failure (Cardiology)  Patient Active Problem List   Diagnosis Date Noted   S/P arteriovenous (AV) fistula creation 12/22/2021   Critical limb ischemia of both lower extremities ([place]) 11/22/2021   Osteomyelitis ([place])    Peripheral artery disease ([place]) 10/27/2021   Acute exacerbation of congestive heart failure ([place]) 10/24/2021   Acute cardiogenic pulmonary edema ([place]) 10/06/2021   Coronary artery disease involving native coronary artery of native heart without angina pectoris 10/06/2021   Goals of care, counseling/discussion    Gangrene of left foot ([place])    Cellulitis of left foot    Diabetic ulcer of left foot ([place]) 09/04/2021   CHF (congestive heart  failure) ([place]) 07/07/2021   Normocytic anemia 06/30/2021   CKD (chronic kidney disease), stage IV ([place]) 06/11/2021   Acute on chronic diastolic CHF (congestive heart failure) ([place]) 06/10/2021   Abnormal nuclear stress test    Dyspnea on exertion 03/07/2021   Orthopnea 02/25/2021   History of cerebrovascular accident (CVA) with residual deficit 05/06/2020   Diabetic peripheral neuropathy ([place]) 04/26/2020   AKI (acute kidney injury) ([place]) 04/20/2020   Generalized weakness 04/20/2020   Dehydration 04/20/2020   Generalized abdominal pain    Pancreatitis, acute 02/29/2020   Cerebrovascular accident (CVA) ([place]) 11/08/2019   Stenosis of right carotid artery 11/08/2019   Mixed diabetic hyperlipidemia associated with type 2 diabetes mellitus (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   Type 2 diabetes mellitus with stage 4 chronic kidney disease, with long-term current use of insulin ([place]) 10/29/2017   History of macular degeneration 10/29/2017   Chronic cervical pain 10/29/2016   Myalgia 09/14/2016   Insomnia 09/14/2016   Tinea pedis of both feet 08/20/2016   Macular degeneration 08/17/2016   Low back pain 09/21/2014   Hypertensive urgency 09/21/2014   Diabetic gastroparesis associated with type 2 diabetes mellitus ([place]) 09/14/2014   Uncontrolled hypertension 09/08/2014   Thyroid nodule 08/17/2014   Obesity (BMI 30-39.9) 08/17/2014   Nausea & vomiting 08/04/2014   Type II diabetes  mellitus with neurological manifestations, uncontrolled 08/04/2014    Current Outpatient Medications:    acetaminophen (TYLENOL) 500 MG tablet, Take 500 mg by mouth every 6 (six) hours as needed for moderate pain or headache., Disp: , Rfl:    amLODipine (NORVASC) 10 MG tablet, Take 1 tablet (10 mg total) by mouth daily., Disp: 30  tablet, Rfl: 6   aspirin 81 MG EC tablet, Take 1 tablet (81 mg total) by mouth daily., Disp: 60 tablet, Rfl: 1   atorvastatin (LIPITOR) 40 MG tablet, Take 1 tablet (40 mg total) by mouth daily., Disp: 30 tablet, Rfl: 6   Blood Glucose Monitoring Suppl (ONETOUCH VERIO) w/Device KIT, Check blood sugar three times daily. E11.40, Disp: 1 kit, Rfl: 0   calcium acetate (PHOSLO) 667 MG capsule, Take 1 capsule (667 mg total) by mouth 3 (three) times daily with meals., Disp: 90 capsule, Rfl: 3   carvedilol (COREG) 12.5 MG tablet, Take 1.5 tablets (18.75 mg total) by mouth 2 (two) times daily with a meal., Disp: 90 tablet, Rfl: 6   Continuous Blood Gluc Receiver (DEXCOM G6 RECEIVER) DEVI, 1 Device by Does not apply route daily., Disp: 1 each, Rfl: 0   Continuous Blood Gluc Sensor (DEXCOM G6 SENSOR) MISC, 1 packet by Does not apply route daily., Disp: 3 each, Rfl: 11   Continuous Blood Gluc Transmit (DEXCOM G6 TRANSMITTER) MISC, 1 packet by Does not apply route daily., Disp: 1 each, Rfl: 4   ferrous sulfate 325 (65 FE) MG tablet, Take 1 tablet (325 mg total) by mouth daily with breakfast., Disp: 100 tablet, Rfl: 1   glucose blood test strip, Use as instructedCheck blood sugar three times daily. E11.40, Disp: 100 each, Rfl: 12   insulin glargine (LANTUS) 100 UNIT/ML Solostar Pen, Inject 30 Units into the skin at bedtime., Disp: 15 mL, Rfl: 3   insulin lispro (HUMALOG KWIKPEN) 100 UNIT/ML KwikPen, Inject 4 units before breakfast and dinner., Disp: 15 mL, Rfl: 3   Insulin Pen Needle 32G X 4 MM MISC, Use to inject insulin as directed., Disp: 100 each, Rfl: 4   metolazone (ZAROXOLYN) 2.5 MG tablet, Take 1 tablet (2.5 mg total) by mouth 2 (two) times a week on Monday and Friday., Disp: 10 tablet, Rfl: 3   potassium chloride SA (KLOR-CON M) 20 MEQ tablet, Take 2 tablets (40 mEq total) by mouth 2 (two) times a week, on Monday and Friday with Metolazone., Disp: 30 tablet, Rfl: 0   risperiDONE (RISPERDAL) 3 MG tablet,  TAKE 1 TABLET (3 MG TOTAL) BY MOUTH AT BEDTIME., Disp: 30 tablet, Rfl: 2   torsemide (DEMADEX) 20 MG tablet, Take 5 tablets (100 mg total) by mouth 2 (two) times daily., Disp: 150 tablet, Rfl: 6 Allergies  Allergen Reactions   Hydralazine Hcl Other (See Comments)    Hair loss   Hydrocodone Itching and Other (See Comments)    Upset stomach   Metformin And Related Nausea And Vomiting and Other (See Comments)    Stomach pains, also   Other Nausea Only and Other (See Comments)    Lettuce- Does not digest this!!   Plaquenil [Hydroxychloroquine Sulfate] Hives   Shellfish-Derived Products Nausea Only and Other (See Comments)    Caused an upset stomach   Shrimp (Diagnostic) Nausea Only and Other (See Comments)    Upset stomach    Sulfa Antibiotics Hives     Social History   Socioeconomic History   Marital status: Legally Separated    Spouse name: [name]  Number of children: 1   Years of education: some colle   Highest education level: Not on file  Occupational History   Occupation: Employed FT as Landscape architect    Comment: [employer] , NA  Tobacco Use   Smoking status: Former    Packs/day: 0.25    Years: 0.50    Pack years: 0.13    Types: Cigarettes   Smokeless tobacco: Never   Tobacco comments:    Former smoker 11/03/21  Vaping Use   Vaping Use: Never used  Substance and Sexual Activity   Alcohol use: No   Drug use: No   Sexual activity: Yes    Birth control/protection: None  Other Topics Concern   Not on file  Social History Narrative   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  Constitutional:      Appearance: Normal appearance. She is normal weight.  HENT:     Head: Normocephalic.     Nose: Nose normal.     Mouth/Throat:     Mouth: Mucous membranes are moist.     Pharynx: Oropharynx is clear.  Eyes:     Conjunctiva/sclera: Conjunctivae normal.     Pupils: Pupils are equal, round, and reactive to light.  Cardiovascular:     Rate and Rhythm: Normal rate and regular rhythm.     Pulses: Normal pulses.     Heart sounds: Normal heart sounds.  Pulmonary:     Effort: Pulmonary effort is normal.     Breath sounds: Normal breath sounds.  Abdominal:     General: Abdomen is flat.     Palpations: Abdomen is soft.  Musculoskeletal:        General: Swelling present. Normal range of motion.     Cervical back: Normal range of motion.     Right lower leg: Edema present.     Left lower leg: Edema present.  Skin:    General: Skin is warm and dry.     Capillary Refill: Capillary refill takes less than 2 seconds.  Neurological:     General: No focal deficit present.     Mental Status: She is alert. Mental status is at baseline.  Psychiatric:        Mood and Affect: Mood normal.        Future Appointments  Date Time Provider [place]  01/26/2022 12:00 PM [facility] PA/NP [facility] None  01/31/2022 11:00 AM [facility] VASC 2 - MC [facility] VVS  01/31/2022 11:45 AM [facility] PA [facility]VS  02/01/2022 11:00 AM Ricard Dillon, MD [facility]  05/18/2022 11:15 AM [name], MD [facility]     ACTION: Home visit completed

## 2022-01-24 NOTE — Telephone Encounter (Signed)
Patient called and made aware of results and recommendations.  Patient was unaware that her nephrologist is Dr. Hollie Salk.  I will attempt to send that office a message to get an appt scheduled for her.

## 2022-01-24 NOTE — Telephone Encounter (Signed)
PA for Dexcom products approved until 07/23/2022

## 2022-01-25 NOTE — Telephone Encounter (Signed)
Called [facility], pt is sch for her 3 m f/u with [name] 02/22/22, she states if we feel pt needs to be sooner we need to fax a request and records to them for [name] to review and move up appt if needed. Advised we are seeing pt on 2/10 and if sooner appt is needed we will fax that request over.

## 2022-01-26 ENCOUNTER — Other Ambulatory Visit: Payer: Self-pay

## 2022-01-26 ENCOUNTER — Encounter (HOSPITAL_COMMUNITY): Payer: Self-pay

## 2022-01-26 ENCOUNTER — Other Ambulatory Visit (HOSPITAL_COMMUNITY): Payer: Self-pay

## 2022-01-26 ENCOUNTER — Ambulatory Visit (HOSPITAL_COMMUNITY)
Admission: RE | Admit: 2022-01-26 | Discharge: 2022-01-26 | Disposition: A | Discharge status: Home / Self Care | Payer: Medicaid Other | Source: Ambulatory Visit | Attending: Family Medicine | Admitting: Family Medicine

## 2022-01-26 VITALS — BP 128/78 | HR 72 | Wt 236.0 lb

## 2022-01-26 DIAGNOSIS — E1122 Type 2 diabetes mellitus with diabetic chronic kidney disease: Secondary | ICD-10-CM | POA: Insufficient documentation

## 2022-01-26 DIAGNOSIS — I272 Pulmonary hypertension, unspecified: Secondary | ICD-10-CM | POA: Insufficient documentation

## 2022-01-26 DIAGNOSIS — E785 Hyperlipidemia, unspecified: Secondary | ICD-10-CM | POA: Insufficient documentation

## 2022-01-26 DIAGNOSIS — I5033 Acute on chronic diastolic (congestive) heart failure: Secondary | ICD-10-CM

## 2022-01-26 DIAGNOSIS — Z9114 Patient's other noncompliance with medication regimen: Secondary | ICD-10-CM | POA: Insufficient documentation

## 2022-01-26 DIAGNOSIS — L89619 Pressure ulcer of right heel, unspecified stage: Secondary | ICD-10-CM | POA: Diagnosis not present

## 2022-01-26 DIAGNOSIS — Z7982 Long term (current) use of aspirin: Secondary | ICD-10-CM | POA: Diagnosis not present

## 2022-01-26 DIAGNOSIS — Z79899 Other long term (current) drug therapy: Secondary | ICD-10-CM | POA: Insufficient documentation

## 2022-01-26 DIAGNOSIS — I6523 Occlusion and stenosis of bilateral carotid arteries: Secondary | ICD-10-CM

## 2022-01-26 DIAGNOSIS — L89629 Pressure ulcer of left heel, unspecified stage: Secondary | ICD-10-CM | POA: Insufficient documentation

## 2022-01-26 DIAGNOSIS — X58XXXD Exposure to other specified factors, subsequent encounter: Secondary | ICD-10-CM | POA: Insufficient documentation

## 2022-01-26 DIAGNOSIS — N185 Chronic kidney disease, stage 5: Secondary | ICD-10-CM | POA: Diagnosis not present

## 2022-01-26 DIAGNOSIS — E1151 Type 2 diabetes mellitus with diabetic peripheral angiopathy without gangrene: Secondary | ICD-10-CM | POA: Diagnosis not present

## 2022-01-26 DIAGNOSIS — I10 Essential (primary) hypertension: Secondary | ICD-10-CM

## 2022-01-26 DIAGNOSIS — N184 Chronic kidney disease, stage 4 (severe): Secondary | ICD-10-CM | POA: Diagnosis not present

## 2022-01-26 DIAGNOSIS — Z87891 Personal history of nicotine dependence: Secondary | ICD-10-CM | POA: Insufficient documentation

## 2022-01-26 DIAGNOSIS — Z6841 Body Mass Index (BMI) 40.0 and over, adult: Secondary | ICD-10-CM | POA: Diagnosis not present

## 2022-01-26 DIAGNOSIS — I2510 Atherosclerotic heart disease of native coronary artery without angina pectoris: Secondary | ICD-10-CM | POA: Diagnosis not present

## 2022-01-26 DIAGNOSIS — I96 Gangrene, not elsewhere classified: Secondary | ICD-10-CM | POA: Diagnosis not present

## 2022-01-26 DIAGNOSIS — E782 Mixed hyperlipidemia: Secondary | ICD-10-CM

## 2022-01-26 DIAGNOSIS — I70208 Unspecified atherosclerosis of native arteries of extremities, other extremity: Secondary | ICD-10-CM | POA: Insufficient documentation

## 2022-01-26 DIAGNOSIS — E11621 Type 2 diabetes mellitus with foot ulcer: Secondary | ICD-10-CM | POA: Diagnosis not present

## 2022-01-26 DIAGNOSIS — E669 Obesity, unspecified: Secondary | ICD-10-CM | POA: Diagnosis not present

## 2022-01-26 DIAGNOSIS — S90934D Unspecified superficial injury of right lesser toe(s), subsequent encounter: Secondary | ICD-10-CM | POA: Diagnosis not present

## 2022-01-26 DIAGNOSIS — I130 Hypertensive heart and chronic kidney disease with heart failure and stage 1 through stage 4 chronic kidney disease, or unspecified chronic kidney disease: Secondary | ICD-10-CM | POA: Diagnosis not present

## 2022-01-26 DIAGNOSIS — I5022 Chronic systolic (congestive) heart failure: Secondary | ICD-10-CM

## 2022-01-26 DIAGNOSIS — E1152 Type 2 diabetes mellitus with diabetic peripheral angiopathy with gangrene: Secondary | ICD-10-CM | POA: Insufficient documentation

## 2022-01-26 DIAGNOSIS — I6529 Occlusion and stenosis of unspecified carotid artery: Secondary | ICD-10-CM | POA: Diagnosis not present

## 2022-01-26 DIAGNOSIS — Z8673 Personal history of transient ischemic attack (TIA), and cerebral infarction without residual deficits: Secondary | ICD-10-CM | POA: Insufficient documentation

## 2022-01-26 DIAGNOSIS — M869 Osteomyelitis, unspecified: Secondary | ICD-10-CM | POA: Diagnosis not present

## 2022-01-26 DIAGNOSIS — E1140 Type 2 diabetes mellitus with diabetic neuropathy, unspecified: Secondary | ICD-10-CM | POA: Insufficient documentation

## 2022-01-26 DIAGNOSIS — I739 Peripheral vascular disease, unspecified: Secondary | ICD-10-CM

## 2022-01-26 LAB — BASIC METABOLIC PANEL
Anion gap: 12 (ref 5–15)
BUN: 67 mg/dL — ABNORMAL HIGH (ref 8–23)
CO2: 25 mmol/L (ref 22–32)
Calcium: 9 mg/dL (ref 8.9–10.3)
Chloride: 94 mmol/L — ABNORMAL LOW (ref 98–111)
Creatinine, Ser: 5.59 mg/dL — ABNORMAL HIGH (ref 0.44–1.00)
GFR, Estimated: 8 mL/min — ABNORMAL LOW (ref >60)
Glucose, Bld: 102 mg/dL — ABNORMAL HIGH (ref 70–99)
Potassium: 4.3 mmol/L (ref 3.5–5.1)
Sodium: 131 mmol/L — ABNORMAL LOW (ref 135–145)

## 2022-01-26 LAB — BRAIN NATRIURETIC PEPTIDE: B Natriuretic Peptide: 303.5 pg/mL — ABNORMAL HIGH (ref 0.0–100.0)

## 2022-01-26 MED ORDER — POTASSIUM CHLORIDE CRYS ER 20 MEQ PO TBCR
40.0000 meq | EXTENDED_RELEASE_TABLET | ORAL | 6 refills | Status: DC
  Filled 2022-01-26: qty 30, 35d supply, fill #0

## 2022-01-26 MED ORDER — METOLAZONE 2.5 MG PO TABS
2.5000 mg | ORAL_TABLET | ORAL | 6 refills | Status: DC
  Filled 2022-01-26: qty 15, 35d supply, fill #0

## 2022-01-26 NOTE — Patient Instructions (Signed)
INCREASE Metolazone 2.5mg  (1 tab) THREE times a WEEK.  Monday, Wednesday, and Friday  INCREASE Potassium to 40 meq (2 tabs) THREE times a WEEK. Monday, Wednesday, and Friday  Labs today and repeat in 10 days We will only contact you if something comes back abnormal or we need to make some changes. Otherwise no news is good news!   You have an appointment with [name] (Kidney doctor) on Thursday March 9th, 2023 at 10:40am.  The address is [address] [place],  [address] [phone]  Your physician recommends that you schedule a follow-up appointment in: 4 weeks and 3 months.   Please call office at [phone] option [phone] if you have any questions or concerns.   Do the following things EVERYDAY: Weigh yourself in the morning before breakfast. Write it down and keep it in a log. Take your medicines as prescribed Eat low salt foods--Limit salt (sodium) to 2000 mg per day.  Stay as active as you can everyday Limit all fluids for the day to less than 2 liters   At the [facility], you and your health needs are our priority. As part of our continuing mission to provide you with exceptional heart care, we have created designated Provider Care Teams. These Care Teams include your primary Cardiologist (physician) and Advanced Practice Providers (APPs- Physician Assistants and Nurse Practitioners) who all work together to provide you with the care you need, when you need it.   You may see any of the following providers on your designated Care Team at your next follow up: Dr Glori Bickers Dr Haynes Kerns, NP Lyda Jester, [place] [facility] [place], [place] Audry Riles, PharmD   Please be sure to bring in all your medications bottles to every appointment.

## 2022-01-29 ENCOUNTER — Telehealth: Payer: Self-pay

## 2022-01-29 NOTE — Telephone Encounter (Signed)
Attempted to contact patient's husband [name] reschedule a Palliative Care appointment. No answer left a message to return call.

## 2022-01-31 ENCOUNTER — Encounter (HOSPITAL_COMMUNITY): Payer: Medicaid Other

## 2022-01-31 ENCOUNTER — Other Ambulatory Visit (HOSPITAL_COMMUNITY): Payer: Self-pay

## 2022-01-31 NOTE — Progress Notes (Signed)
Paramedicine Encounter    Patient ID: Nancy [name], female    DOB: 1960/12/23, 61 y.o.   MRN: [id number]   Arrived for home visit for [name] who reports feeling good today. [facility] RN was in the home when I arrived reporting they would be sending out a letter for approval for home PCS services. Vitals and assessment obtained. Lower leg edema still present. Lungs clear.   CBG- 113  Meds reviewed and confirmed pill box filled for one week.   Refills: Torsemide Atorvastatin Amlodipine  We reviewed diet management. Appointments confirmed. Home visit complete. I will see Nancy [name] in one week.   Patient Care Team: [name], MD as PCP - General (Internal Medicine) [name], MD as PCP - Cardiology (Cardiology) Larey Dresser, MD as PCP - Advanced Heart Failure (Cardiology)  Patient Active Problem List   Diagnosis Date Noted   S/P arteriovenous (AV) fistula creation 12/22/2021   Critical limb ischemia of both lower extremities ([place]) 11/22/2021   Osteomyelitis ([place])    Peripheral artery disease ([place]) 10/27/2021   Acute exacerbation of congestive heart failure ([place]) 10/24/2021   Acute cardiogenic pulmonary edema ([place]) 10/06/2021   Coronary artery disease involving native coronary artery of native heart without angina pectoris 10/06/2021   Goals of care, counseling/discussion    Gangrene of left foot ([place])    Cellulitis of left foot    Diabetic ulcer of left foot ([place]) 09/04/2021   CHF (congestive heart failure) ([place]) 07/07/2021   Normocytic anemia 06/30/2021   CKD (chronic kidney disease), stage IV ([place]) 06/11/2021   Acute on chronic diastolic CHF (congestive heart failure) ([place]) 06/10/2021   Abnormal nuclear stress test    Dyspnea on exertion 03/07/2021   Orthopnea 02/25/2021   History of cerebrovascular accident (CVA) with residual deficit 05/06/2020   Diabetic peripheral neuropathy ([place]) 04/26/2020   AKI (acute kidney injury) ([place]) 04/20/2020    Generalized weakness 04/20/2020   Dehydration 04/20/2020   Generalized abdominal pain    Pancreatitis, acute 02/29/2020   Cerebrovascular accident (CVA) ([place]) 11/08/2019   Stenosis of right carotid artery 11/08/2019   Mixed diabetic hyperlipidemia associated with type 2 diabetes mellitus ([place]) 11/08/2019   Cortical age-related cataract of both eyes 08/30/2019   Gait abnormality 08/30/2019   Statin declined 08/30/2019   Gastroesophageal reflux disease without esophagitis 04/18/2018   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   Type 2 diabetes mellitus with stage 4 chronic kidney disease, with long-term current use of insulin ([place]) 10/29/2017   History of macular degeneration 10/29/2017   Chronic cervical pain 10/29/2016   Myalgia 09/14/2016   Insomnia 09/14/2016   Tinea pedis of both feet 08/20/2016   Macular degeneration 08/17/2016   Low back pain 09/21/2014   Hypertensive urgency 09/21/2014   Diabetic gastroparesis associated with type 2 diabetes mellitus ([place]) 09/14/2014   Uncontrolled hypertension 09/08/2014   Thyroid nodule 08/17/2014   Obesity (BMI 30-39.9) 08/17/2014   Nausea & vomiting 08/04/2014   Type II diabetes mellitus with neurological manifestations, uncontrolled 08/04/2014    Current Outpatient Medications:    acetaminophen (TYLENOL) 500 MG tablet, Take 500 mg by mouth every 6 (six) hours as needed for moderate pain or headache., Disp: , Rfl:    amLODipine (NORVASC) 10 MG tablet, Take 1 tablet (10 mg total) by mouth daily., Disp: 30 tablet, Rfl: 6   aspirin 81 MG EC tablet, Take 1 tablet (81 mg total) by mouth daily., Disp: 60 tablet, Rfl: 1   atorvastatin (LIPITOR) 40 MG  tablet, Take 1 tablet (40 mg total) by mouth daily., Disp: 30 tablet, Rfl: 6   Blood Glucose Monitoring Suppl (ONETOUCH VERIO) w/Device KIT, Check blood sugar three times daily. E11.40, Disp: 1 kit, Rfl: 0   calcium acetate (PHOSLO) 667 MG capsule, Take 1 capsule (667 mg total) by mouth 3 (three) times daily with  meals., Disp: 90 capsule, Rfl: 3   carvedilol (COREG) 12.5 MG tablet, Take 1.5 tablets (18.75 mg total) by mouth 2 (two) times daily with a meal., Disp: 90 tablet, Rfl: 6   Continuous Blood Gluc Receiver (DEXCOM G6 RECEIVER) DEVI, 1 Device by Does not apply route daily., Disp: 1 each, Rfl: 0   Continuous Blood Gluc Sensor (DEXCOM G6 SENSOR) MISC, 1 packet by Does not apply route daily., Disp: 3 each, Rfl: 11   Continuous Blood Gluc Transmit (DEXCOM G6 TRANSMITTER) MISC, 1 packet by Does not apply route daily., Disp: 1 each, Rfl: 4   ferrous sulfate 325 (65 FE) MG tablet, Take 1 tablet (325 mg total) by mouth daily with breakfast., Disp: 100 tablet, Rfl: 1   glucose blood test strip, Use as instructedCheck blood sugar three times daily. E11.40, Disp: 100 each, Rfl: 12   insulin glargine (LANTUS) 100 UNIT/ML Solostar Pen, Inject 30 Units into the skin at bedtime., Disp: 15 mL, Rfl: 3   insulin lispro (HUMALOG KWIKPEN) 100 UNIT/ML KwikPen, Inject 4 units before breakfast and dinner., Disp: 15 mL, Rfl: 3   Insulin Pen Needle 32G X 4 MM MISC, Use to inject insulin as directed., Disp: 100 each, Rfl: 4   metolazone (ZAROXOLYN) 2.5 MG tablet, Take 1 tablet (2.5 mg total) by mouth 3 (three) times a week. Monday, Wednesday and Friday, Disp: 15 tablet, Rfl: 6   potassium chloride SA (KLOR-CON M) 20 MEQ tablet, Take 2 tablets (40 mEq total) by mouth 3 (three) times a week. Monday, Wednesday, and Friday, Disp: 30 tablet, Rfl: 6   risperiDONE (RISPERDAL) 3 MG tablet, TAKE 1 TABLET (3 MG TOTAL) BY MOUTH AT BEDTIME., Disp: 30 tablet, Rfl: 2   torsemide (DEMADEX) 20 MG tablet, Take 5 tablets (100 mg total) by mouth 2 (two) times daily., Disp: 150 tablet, Rfl: 6 Allergies  Allergen Reactions   Hydralazine Hcl Other (See Comments)    Hair loss   Hydrocodone Itching and Other (See Comments)    Upset stomach   Metformin And Related Nausea And Vomiting and Other (See Comments)    Stomach pains, also   Other Nausea  Only and Other (See Comments)    Lettuce- Does not digest this!!   Plaquenil [Hydroxychloroquine Sulfate] Hives   Shellfish-Derived Products Nausea Only and Other (See Comments)    Caused an upset stomach   Shrimp (Diagnostic) Nausea Only and Other (See Comments)    Upset stomach    Sulfa Antibiotics Hives     Social History   Socioeconomic History   Marital status: Legally Separated    Spouse name: [name]   Number of children: 1   Years of education: some colle   Highest education level: Not on file  Occupational History   Occupation: Employed FT as Landscape architect    Comment: [employer] , NA  Tobacco Use   Smoking status: Former    Packs/day: 0.25    Years: 0.50    Pack years: 0.13    Types: Cigarettes   Smokeless tobacco: Never   Tobacco comments:    Former smoker 11/03/21  Vaping Use   Vaping Use: Never used  Substance and Sexual Activity   Alcohol use: No   Drug use: No   Sexual activity: Yes    Birth control/protection: None  Other Topics Concern   Not on file  Social History Narrative   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  Constitutional:      Appearance: Normal appearance. She is normal weight.  HENT:     Head: Normocephalic.     Nose: Nose normal.     Mouth/Throat:     Mouth: Mucous membranes are moist.     Pharynx: Oropharynx is clear.  Eyes:      Conjunctiva/sclera: Conjunctivae normal.     Pupils: Pupils are equal, round, and reactive to light.  Cardiovascular:     Rate and Rhythm: Normal rate and regular rhythm.     Pulses: Normal pulses.     Heart sounds: Normal heart sounds.  Abdominal:     General: Abdomen is flat.     Palpations: Abdomen is soft.  Musculoskeletal:        General: Swelling present. Normal range of motion.     Cervical back: Normal range of motion.     Right lower leg: Edema present.     Left lower leg: Edema present.  Skin:    General: Skin is warm and dry.     Capillary Refill: Capillary refill takes less than 2 seconds.  Neurological:     General: No focal deficit present.     Mental Status: She is alert. Mental status is at baseline.  Psychiatric:        Mood and Affect: Mood normal.        Future Appointments  Date Time Provider [place]  02/01/2022 11:00 AM Ricard Dillon, MD [facility]  02/06/2022 11:30 AM [facility] LAB [facility] None  02/26/2022 11:00 AM [facility] PA/NP [facility] None  03/14/2022  1:00 PM [facility] VASC [facility] - [facility]  03/14/2022  2:00 PM [facility] PA [facility]  04/20/2022 10:40 AM Larey Dresser, MD [facility] None  05/18/2022 11:15 AM [name], MD [facility]     ACTION: Home visit completed

## 2022-02-01 ENCOUNTER — Other Ambulatory Visit: Payer: Self-pay

## 2022-02-01 ENCOUNTER — Ambulatory Visit (HOSPITAL_COMMUNITY)
Admission: RE | Admit: 2022-02-01 | Discharge: 2022-02-01 | Disposition: A | Discharge status: Home / Self Care | Payer: Medicaid Other | Source: Ambulatory Visit | Attending: Internal Medicine | Admitting: Internal Medicine

## 2022-02-01 ENCOUNTER — Other Ambulatory Visit (HOSPITAL_COMMUNITY): Payer: Self-pay | Admitting: Internal Medicine

## 2022-02-01 ENCOUNTER — Encounter (HOSPITAL_BASED_OUTPATIENT_CLINIC_OR_DEPARTMENT_OTHER): Payer: Medicaid Other | Admitting: Internal Medicine

## 2022-02-01 DIAGNOSIS — M869 Osteomyelitis, unspecified: Secondary | ICD-10-CM | POA: Diagnosis present

## 2022-02-01 DIAGNOSIS — E11621 Type 2 diabetes mellitus with foot ulcer: Secondary | ICD-10-CM

## 2022-02-01 DIAGNOSIS — L97509 Non-pressure chronic ulcer of other part of unspecified foot with unspecified severity: Secondary | ICD-10-CM | POA: Insufficient documentation

## 2022-02-01 DIAGNOSIS — E1169 Type 2 diabetes mellitus with other specified complication: Secondary | ICD-10-CM | POA: Insufficient documentation

## 2022-02-02 NOTE — Progress Notes (Signed)
[name], [name] ([id number]) Visit Report for 02/01/2022 Arrival Information Details Patient Name: Date of Service: Nancy [name] 02/01/2022 11:00 A M Medical Record Number: [id number] Patient Account Number: [id number] Date of Birth/Sex: Treating RN: Feb 03, 1961 (61 y.o. Donalda Ewings Primary Care Antonia Jicha: [name] Other Clinician: Referring Ahriana Gunkel: Treating Portia Wisdom/Extender: Nyra Market in Treatment: 59 Visit Information History Since Last Visit Added or deleted any medications: No Patient Arrived: Wheel Chair Any new allergies or adverse reactions: No Arrival Time: 11:03 Had a fall or experienced change in No Accompanied By: husband activities of daily living that may affect Transfer Assistance: Manual risk of falls: Patient Identification Verified: Yes Signs or symptoms of abuse/neglect since last visito No Secondary Verification Process Completed: Yes Hospitalized since last visit: No Patient Requires Transmission-Based No Has Dressing in Place as Prescribed: Yes Precautions: Pain Present Now: No Patient Has Alerts: Yes Patient Alerts: ABI's: 09/22 R:0.45 L0.5 ABIs 01/05/22 R=.63, L=.97 Electronic Signature(s) Signed: 02/01/2022 5:23:57 PM By: Sharyn Creamer RN, BSN Entered By: Sharyn Creamer on 02/01/2022 11:04:36 -------------------------------------------------------------------------------- Clinic Level of Care Assessment Details Patient Name: Date of Service: Nancy [name] 02/01/2022 11:00 A M Medical Record Number: [id number] Patient Account Number: [id number] Date of Birth/Sex: Treating RN: March 05, 1961 (61 y.o. Nancy [name] Primary Care Constantine Ruddick: [name] Other Clinician: Referring Jameelah Watts: Treating Kincaid Tiger/Extender: Nyra Market in Treatment: 17 Clinic Level of Care Assessment Items TOOL 4 Quantity Score X- 1 0 Use when only an EandM is performed on  FOLLOW-UP visit ASSESSMENTS - Nursing Assessment / Reassessment X- 1 10 Reassessment of Co-morbidities (includes updates in patient status) X- 1 5 Reassessment of Adherence to Treatment Plan ASSESSMENTS - Wound and Skin A ssessment / Reassessment X - Simple Wound Assessment / Reassessment - one wound 1 5 []  - 0 Complex Wound Assessment / Reassessment - multiple wounds []  - 0 Dermatologic / Skin Assessment (not related to wound area) ASSESSMENTS - Focused Assessment []  - 0 Circumferential Edema Measurements - multi extremities []  - 0 Nutritional Assessment / Counseling / Intervention []  - 0 Lower Extremity Assessment (monofilament, tuning fork, pulses) []  - 0 Peripheral Arterial Disease Assessment (using hand held doppler) ASSESSMENTS - Ostomy and/or Continence Assessment and Care []  - 0 Incontinence Assessment and Management []  - 0 Ostomy Care Assessment and Management (repouching, etc.) PROCESS - Coordination of Care X - Simple Patient / Family Education for ongoing care 1 15 []  - 0 Complex (extensive) Patient / Family Education for ongoing care X- 1 10 Staff obtains Programmer, systems, Records, T Results / Process Orders est X- 1 10 Staff telephones HHA, Nursing Homes / Clarify orders / etc []  - 0 Routine Transfer to another Facility (non-emergent condition) []  - 0 Routine Hospital Admission (non-emergent condition) []  - 0 New Admissions / Biomedical engineer / Ordering NPWT Apligraf, etc. , []  - 0 Emergency Hospital Admission (emergent condition) []  - 0 Simple Discharge Coordination X- 1 15 Complex (extensive) Discharge Coordination PROCESS - Special Needs []  - 0 Pediatric / Minor Patient Management []  - 0 Isolation Patient Management []  - 0 Hearing / Language / Visual special needs []  - 0 Assessment of Community assistance (transportation, D/C planning, etc.) []  - 0 Additional assistance / Altered mentation []  - 0 Support Surface(s) Assessment (bed, cushion,  seat, etc.) INTERVENTIONS - Wound Cleansing / Measurement X - Simple Wound Cleansing - one wound 1 5 []  - 0 Complex Wound Cleansing - multiple wounds  X- 1 5 Wound Imaging (photographs - any number of wounds) X- 1 5 Wound Tracing (instead of photographs) X- 1 5 Simple Wound Measurement - one wound []  - 0 Complex Wound Measurement - multiple wounds INTERVENTIONS - Wound Dressings X - Small Wound Dressing one or multiple wounds 1 10 []  - 0 Medium Wound Dressing one or multiple wounds []  - 0 Large Wound Dressing one or multiple wounds []  - 0 Application of Medications - topical []  - 0 Application of Medications - injection INTERVENTIONS - Miscellaneous []  - 0 External ear exam []  - 0 Specimen Collection (cultures, biopsies, blood, body fluids, etc.) []  - 0 Specimen(s) / Culture(s) sent or taken to Lab for analysis []  - 0 Patient Transfer (multiple staff / Civil Service fast streamer / Similar devices) []  - 0 Simple Staple / Suture removal (25 or less) []  - 0 Complex Staple / Suture removal (26 or more) []  - 0 Hypo / Hyperglycemic Management (close monitor of Blood Glucose) []  - 0 Ankle / Brachial Index (ABI) - do not check if billed separately X- 1 5 Vital Signs Has the patient been seen at the hospital within the last three years: Yes Total Score: 105 Level Of Care: New/Established - Level 3 Electronic Signature(s) Signed: 02/02/2022 12:44:49 PM By: Dellie Catholic RN Entered By: Dellie Catholic on 02/01/2022 18:21:16 -------------------------------------------------------------------------------- Encounter Discharge Information Details Patient Name: Date of Service: Nancy [name] 02/01/2022 11:00 A M Medical Record Number: [id number] Patient Account Number: [id number] Date of Birth/Sex: Treating RN: 1961/05/24 (61 y.o. Nancy [name] Primary Care Marwah Disbro: [name] Other Clinician: Referring Franciszek Platten: Treating Rebbecca Osuna/Extender: Nyra Market in Treatment: 17 Encounter Discharge Information Items Discharge Condition: Stable Ambulatory Status: Wheelchair Discharge Destination: Home Transportation: Private Auto Accompanied By: spouse Schedule Follow-up Appointment: Yes Clinical Summary of Care: Patient Declined Electronic Signature(s) Signed: 02/02/2022 12:44:49 PM By: Dellie Catholic RN Entered By: Dellie Catholic on 02/01/2022 18:22:31 -------------------------------------------------------------------------------- Lower Extremity Assessment Details Patient Name: Date of Service: Nancy [name] 02/01/2022 11:00 A M Medical Record Number: [id number] Patient Account Number: [id number] Date of Birth/Sex: Treating RN: [birth year] (61 y.o. Donalda Ewings Primary Care Nyemah Watton: [name] Other Clinician: Referring Navy Belay: Treating Reice Bienvenue/Extender: Nyra Market in Treatment: 17 Edema Assessment Assessed: Shirlyn Goltz: No] Patrice Paradise: No] Edema: [Left: Ye] [Right: s] Calf Left: Right: Point of Measurement: 33 cm From Medial Instep 48.2 cm Ankle Left: Right: Point of Measurement: 10 cm From Medial Instep 30.1 cm Vascular Assessment Pulses: Dorsalis Pedis Palpable: [Left:Yes] Electronic Signature(s) Signed: 02/01/2022 5:23:57 PM By: Sharyn Creamer RN, BSN Entered By: Sharyn Creamer on 02/01/2022 11:09:00 -------------------------------------------------------------------------------- Multi Wound Chart Details Patient Name: Date of Service: Nancy [name] 02/01/2022 11:00 A M Medical Record Number: [id number] Patient Account Number: [id number] Date of Birth/Sex: Treating RN: 1961/01/24 (61 y.o. Nancy [name] Primary Care Broc Caspers: [name] Other Clinician: Referring Carah Barrientes: Treating Breyson Kelm/Extender: Nyra Market in Treatment: 17 Vital Signs Height(in): 13 Pulse(bpm): 10 Weight(lbs): 48 Blood Pressure(mmHg):  131/74 Body Mass Index(BMI): 41.1 Temperature(F): 98.8 Respiratory Rate(breaths/min): 20 Photos: [N/A:N/A] Left Calcaneus N/A N/A Wound Location: Gradually Appeared N/A N/A Wounding Event: Diabetic Wound/Ulcer of the Lower N/A N/A Primary Etiology: Extremity Congestive Heart Failure, N/A N/A Comorbid History: Hypertension, Peripheral Arterial Disease, Type II Diabetes, Dementia, Neuropathy 08/04/2021 N/A N/A Date Acquired: 17 N/A N/A Weeks of Treatment: Open N/A N/A Wound Status: No N/A N/A Wound Recurrence: 5.2x3.5x1 N/A  N/A Measurements L x W x D (cm) 14.294 N/A N/A A (cm) : rea 14.294 N/A N/A Volume (cm) : 37.20% N/A N/A % Reduction in A rea: -527.50% N/A N/A % Reduction in Volume: 9 Starting Position 1 (o'clock): 11 Ending Position 1 (o'clock): 1 Maximum Distance 1 (cm): Yes N/A N/A Undermining: Grade 2 N/A N/A Classification: Large N/A N/A Exudate A mount: Serosanguineous N/A N/A Exudate Type: red, [name] N/A N/A Exudate Color: Thickened N/A N/A Wound Margin: Medium (34-66%) N/A N/A Granulation A mount: Red N/A N/A Granulation Quality: Medium (34-66%) N/A N/A Necrotic A mount: Fat Layer (Subcutaneous Tissue): Yes N/A N/A Exposed Structures: Fascia: No Tendon: No Muscle: No Joint: No Bone: No Small (1-33%) N/A N/A Epithelialization: Treatment Notes Electronic Signature(s) Signed: 02/01/2022 5:55:17 PM By: Linton Ham MD Signed: 02/02/2022 12:44:49 PM By: Dellie Catholic RN Entered By: Linton Ham on 02/01/2022 12:24:17 -------------------------------------------------------------------------------- Multi-Disciplinary Care Plan Details Patient Name: Date of Service: [address] ERO NICA H. 02/01/2022 11:00 A M Medical Record Number: [id number] Patient Account Number: [id number] Date of Birth/Sex: Treating RN: [birth year] (60 y.o. Nancy [name] Primary Care Eilene Voigt: [name] Other Clinician: Referring  Wilbur Oakland: Treating Franchot Pollitt/Extender: Nyra Market in Treatment: Disautel reviewed with physician Active Inactive Nutrition Nursing Diagnoses: Impaired glucose control: actual or potential Potential for alteratiion in Nutrition/Potential for imbalanced nutrition Goals: Patient/caregiver will maintain therapeutic glucose control Date Initiated: 01/18/2022 Target Resolution Date: 02/15/2022 Goal Status: Active Interventions: Assess HgA1c results as ordered upon admission and as needed Assess patient nutrition upon admission and as needed per policy Provide education on elevated blood sugars and impact on wound healing Treatment Activities: Patient referred to Primary Care Physician for further nutritional evaluation : 01/18/2022 Notes: Wound/Skin Impairment Nursing Diagnoses: Impaired tissue integrity Knowledge deficit related to ulceration/compromised skin integrity Goals: Patient will have a decrease in wound volume by X% from date: (specify in notes) Date Initiated: 10/04/2021 Target Resolution Date: 02/09/2022 Goal Status: Active Patient/caregiver will verbalize understanding of skin care regimen Date Initiated: 10/04/2021 Target Resolution Date: 02/09/2022 Goal Status: Active Ulcer/skin breakdown will have a volume reduction of 30% by week 4 Date Initiated: 10/04/2021 Target Resolution Date: 02/09/2022 Goal Status: Active Interventions: Assess patient/caregiver ability to obtain necessary supplies Assess patient/caregiver ability to perform ulcer/skin care regimen upon admission and as needed Assess ulceration(s) every visit Notes: Electronic Signature(s) Signed: 02/02/2022 12:44:49 PM By: Dellie Catholic RN Entered By: Dellie Catholic on 02/01/2022 18:19:18 -------------------------------------------------------------------------------- Pain Assessment Details Patient Name: Date of Service: Nancy [name]  02/01/2022 11:00 A M Medical Record Number: [id number] Patient Account Number: [id number] Date of Birth/Sex: Treating RN: October 12, 1961 (61 y.o. Donalda Ewings Primary Care Taquila Leys: [name] Other Clinician: Referring Kylan Liberati: Treating Tandra Rosado/Extender: Nyra Market in Treatment: 17 Active Problems Location of Pain Severity and Description of Pain Patient Has Paino No Site Locations Pain Management and Medication Current Pain Management: Electronic Signature(s) Signed: 02/01/2022 5:23:57 PM By: Sharyn Creamer RN, BSN Entered By: Sharyn Creamer on 02/01/2022 11:05:31 -------------------------------------------------------------------------------- Patient/Caregiver Education Details Patient Name: Date of Service: Nancy [name] 2/16/2023andnbsp11:00 Anita Record Number: [id number] Patient Account Number: [id number] Date of Birth/Gender: Treating RN: 06/07/1961 (61 y.o. Nancy [name] Primary Care Physician: [name] Other Clinician: Referring Physician: Treating Physician/Extender: Nyra Market in Treatment: 17 Education Assessment Education Provided To: Patient Education Topics Provided Elevated Blood Sugar/ Impact on Healing: Methods: Explain/Verbal Responses: Return  demonstration correctly Electronic Signature(s) Signed: 02/02/2022 12:44:49 PM By: Dellie Catholic RN Entered By: Dellie Catholic on 02/01/2022 18:19:51 -------------------------------------------------------------------------------- Wound Assessment Details Patient Name: Date of Service: Nancy [name] 02/01/2022 11:00 A M Medical Record Number: [id number] Patient Account Number: [id number] Date of Birth/Sex: Treating RN: [birth year] (61 y.o. Donalda Ewings Primary Care Donye Dauenhauer: [name] Other Clinician: Referring Norah Devin: Treating Scott Fix/Extender: Nyra Market in  Treatment: 17 Wound Status Wound Number: 2 Primary Diabetic Wound/Ulcer of the Lower Extremity Etiology: Wound Location: Left Calcaneus Wound Open Wounding Event: Gradually Appeared Status: Date Acquired: 08/04/2021 Comorbid Congestive Heart Failure, Hypertension, Peripheral Arterial Weeks Of Treatment: 17 History: Disease, Type II Diabetes, Dementia, Neuropathy Clustered Wound: No Photos Wound Measurements Length: (cm) 5.2 Width: (cm) 3.5 Depth: (cm) 1 Area: (cm) 14.294 Volume: (cm) 14.294 % Reduction in Area: 37.2% % Reduction in Volume: -527.5% Epithelialization: Small (1-33%) Tunneling: No Undermining: Yes Starting Position (o'clock): 9 Ending Position (o'clock): 11 Maximum Distance: (cm) 1 Wound Description Classification: Grade 2 Wound Margin: Thickened Exudate Amount: Large Exudate Type: Serosanguineous Exudate Color: red, [name] Foul Odor After Cleansing: No Slough/Fibrino Yes Wound Bed Granulation Amount: Medium (34-66%) Exposed Structure Granulation Quality: Red Fascia Exposed: No Necrotic Amount: Medium (34-66%) Fat Layer (Subcutaneous Tissue) Exposed: Yes Tendon Exposed: No Muscle Exposed: No Joint Exposed: No Bone Exposed: No Treatment Notes Wound #2 (Calcaneus) Wound Laterality: Left Cleanser Normal Saline Discharge Instruction: Cleanse the wound with Normal Saline prior to applying a clean dressing using gauze sponges, not tissue or cotton balls. Soap and Water Discharge Instruction: May shower and wash wound with dial antibacterial soap and water prior to dressing change. Wound Cleanser Discharge Instruction: Cleanse the wound with wound cleanser prior to applying a clean dressing using gauze sponges, not tissue or cotton balls. Peri-Wound Care Topical Primary Dressing KerraCel Ag Gelling Fiber Dressing, 4x5 in (silver alginate) Discharge Instruction: Apply silver alginate to wound bed as instructed Secondary Dressing Woven Gauze Sponge,  Non-Sterile 4x4 in Discharge Instruction: Apply over primary dressing as directed. ABD Pad, 5x9 Discharge Instruction: Apply over primary dressing as directed. Secured With [employer], 4.5x3.1 (in/yd) Discharge Instruction: Secure with Kerlix as directed. 34M Medipore H Soft Cloth Surgical T ape, 4 x 10 (in/yd) Discharge Instruction: Secure with tape as directed. Compression Wrap Compression Stockings Add-Ons Electronic Signature(s) Signed: 02/01/2022 5:23:57 PM By: Sharyn Creamer RN, BSN Entered By: Sharyn Creamer on 02/01/2022 11:20:15 -------------------------------------------------------------------------------- Vitals Details Patient Name: Date of Service: Nancy [name] 02/01/2022 11:00 A M Medical Record Number: [id number] Patient Account Number: [id number] Date of Birth/Sex: Treating RN: 12/09/1961 (61 y.o. Donalda Ewings Primary Care Ladarion Munyon: [name] Other Clinician: Referring Sumi Lye: Treating Marni Franzoni/Extender: Nyra Market in Treatment: 17 Vital Signs Time Taken: 10:59 Temperature (F): 98.8 Height (in): 63 Pulse (bpm): 73 Weight (lbs): 232 Respiratory Rate (breaths/min): 20 Body Mass Index (BMI): 41.1 Blood Pressure (mmHg): 131/74 Reference Range: 80 - 120 mg / dl Electronic Signature(s) Signed: 02/01/2022 5:23:57 PM By: Sharyn Creamer RN, BSN Entered By: Sharyn Creamer on 02/01/2022 11:05:12

## 2022-02-05 ENCOUNTER — Other Ambulatory Visit: Payer: Self-pay

## 2022-02-05 ENCOUNTER — Encounter (HOSPITAL_BASED_OUTPATIENT_CLINIC_OR_DEPARTMENT_OTHER): Payer: Medicaid Other | Admitting: Internal Medicine

## 2022-02-05 ENCOUNTER — Other Ambulatory Visit (HOSPITAL_BASED_OUTPATIENT_CLINIC_OR_DEPARTMENT_OTHER): Payer: Self-pay | Admitting: Internal Medicine

## 2022-02-05 DIAGNOSIS — E11621 Type 2 diabetes mellitus with foot ulcer: Secondary | ICD-10-CM | POA: Diagnosis not present

## 2022-02-05 LAB — ANAEROBIC CULTURE W GRAM STAIN

## 2022-02-05 NOTE — Progress Notes (Signed)
CHANDNI, GAGAN ([id number]) Visit Report for 02/05/2022 Arrival Information Details Patient Name: Date of Service: [name] 02/05/2022 9:45 A M Medical Record Number: [id number] Patient Account Number: [id number] Date of Birth/Sex: Treating RN: Mar 23, 1961 (61 y.o. Nancy [name] Primary Care Tongela Encinas: [name] Other Clinician: Referring Macel Yearsley: Treating Sheela Mcculley/Extender: Annice Needy in Treatment: 80 Visit Information History Since Last Visit Added or deleted any medications: No Patient Arrived: Wheel Chair Any new allergies or adverse reactions: No Arrival Time: 10:23 Had a fall or experienced change in No Accompanied By: spouse activities of daily living that may affect Transfer Assistance: Manual risk of falls: Patient Identification Verified: Yes Signs or symptoms of abuse/neglect since last visito No Patient Requires Transmission-Based No Hospitalized since last visit: No Precautions: Implantable device outside of the clinic excluding No Patient Has Alerts: Yes cellular tissue based products placed in the center Patient Alerts: ABI's: 09/22 R:0.45 L0.5 since last visit: ABIs 01/05/22 R=.63, Has Dressing in Place as Prescribed: Yes L=.97 Pain Present Now: Yes Electronic Signature(s) Signed: 02/05/2022 5:49:24 PM By: Dellie Catholic RN Entered By: Dellie Catholic on 02/05/2022 10:32:51 -------------------------------------------------------------------------------- Encounter Discharge Information Details Patient Name: Date of Service: [name] 02/05/2022 9:45 A M Medical Record Number: [id number] Patient Account Number: [id number] Date of Birth/Sex: Treating RN: [birth year] (61 y.o. Nancy [name] Primary Care Able Malloy: [name] Other Clinician: Referring Wendell Fiebig: Treating Karry Barrilleaux/Extender: Nyra Market in Treatment: 17 Encounter Discharge Information  Items Post Procedure Vitals Discharge Condition: Stable Temperature (F): 98.8 Ambulatory Status: Wheelchair Pulse (bpm): 67 Discharge Destination: Home Respiratory Rate (breaths/min): 18 Transportation: Private Auto Blood Pressure (mmHg): 137/79 Accompanied By: spouse Schedule Follow-up Appointment: Yes Clinical Summary of Care: Patient Declined Electronic Signature(s) Signed: 02/05/2022 5:49:24 PM By: Dellie Catholic RN Entered By: Dellie Catholic on 02/05/2022 17:48:03 -------------------------------------------------------------------------------- Lower Extremity Assessment Details Patient Name: Date of Service: [name] 02/05/2022 9:45 A M Medical Record Number: [id number] Patient Account Number: [id number] Date of Birth/Sex: Treating RN: 12/19/1960 (61 y.o. Nancy [name] Primary Care Dawnette Mione: [name] Other Clinician: Referring Haadi Santellan: Treating Trenell Concannon/Extender: Nyra Market in Treatment: 17 Edema Assessment Assessed: Shirlyn Goltz: No] Patrice Paradise: No] Edema: [Left: Ye] [Right: s] Calf Left: Right: Point of Measurement: 33 cm From Medial Instep 48.2 cm Ankle Left: Right: Point of Measurement: 10 cm From Medial Instep 31 cm Electronic Signature(s) Signed: 02/05/2022 5:49:24 PM By: Dellie Catholic RN Entered By: Dellie Catholic on 02/05/2022 10:40:07 -------------------------------------------------------------------------------- Multi Wound Chart Details Patient Name: Date of Service: [name] 02/05/2022 9:45 A M Medical Record Number: [id number] Patient Account Number: [id number] Date of Birth/Sex: Treating RN: [birth year] (61 y.o. Nancy [name] Primary Care Jocelyne Reinertsen: [name] Other Clinician: Referring Toneisha Savary: Treating Kadience Macchi/Extender: Nyra Market in Treatment: 17 Vital Signs Height(in): 54 Pulse(bpm): 2 Weight(lbs): 40 Blood Pressure(mmHg):  137/79 Body Mass Index(BMI): 41.1 Temperature(F): 98.8 Respiratory Rate(breaths/min): 18 Photos: [N/A:N/A] Left Calcaneus N/A N/A Wound Location: Gradually Appeared N/A N/A Wounding Event: Diabetic Wound/Ulcer of the Lower N/A N/A Primary Etiology: Extremity Congestive Heart Failure, N/A N/A Comorbid History: Hypertension, Peripheral Arterial Disease, Type II Diabetes, Dementia, Neuropathy 08/04/2021 N/A N/A Date Acquired: 17 N/A N/A Weeks of Treatment: Open N/A N/A Wound Status: No N/A N/A Wound Recurrence: 5.4x4.5x1.2 N/A N/A Measurements L x W x D (cm) 19.085 N/A N/A A (cm) : rea 22.902 N/A N/A Volume (cm) :  16.20% N/A N/A % Reduction in A rea: -905.40% N/A N/A % Reduction in Volume: 1 Starting Position 1 (o'clock): 5 Ending Position 1 (o'clock): 1 Maximum Distance 1 (cm): Yes N/A N/A Undermining: Grade 2 N/A N/A Classification: Large N/A N/A Exudate A mount: Serosanguineous N/A N/A Exudate Type: red, [name] N/A N/A Exudate Color: Thickened N/A N/A Wound Margin: Medium (34-66%) N/A N/A Granulation A mount: Red, Pink, Friable N/A N/A Granulation Quality: Medium (34-66%) N/A N/A Necrotic A mount: Eschar, Adherent Slough N/A N/A Necrotic Tissue: Fat Layer (Subcutaneous Tissue): Yes N/A N/A Exposed Structures: Fascia: No Tendon: No Muscle: No Joint: No Bone: No Small (1-33%) N/A N/A Epithelialization: Debridement - Excisional N/A N/A Debridement: Pre-procedure Verification/Time Out 11:15 N/A N/A Taken: Other N/A N/A Pain Control: Subcutaneous, Slough N/A N/A Tissue Debrided: Skin/Subcutaneous Tissue N/A N/A Level: 24.3 N/A N/A Debridement A (sq cm): rea Blade, Rongeur N/A N/A Instrument: Large N/A N/A Bleeding: Pressure N/A N/A Hemostasis A chieved: 0 N/A N/A Procedural Pain: 0 N/A N/A Post Procedural Pain: Procedure was tolerated well N/A N/A Debridement Treatment Response: 5.4x4.5x1.2 N/A N/A Post Debridement  Measurements L x W x D (cm) 22.902 N/A N/A Post Debridement Volume: (cm) Debridement N/A N/A Procedures Performed: Treatment Notes Electronic Signature(s) Signed: 02/05/2022 2:53:35 PM By: Linton Ham MD Signed: 02/05/2022 5:49:24 PM By: Dellie Catholic RN Entered By: Linton Ham on 02/05/2022 11:41:13 -------------------------------------------------------------------------------- Multi-Disciplinary Care Plan Details Patient Name: Date of Service: [address] ERO NICA H. 02/05/2022 9:45 A M Medical Record Number: [id number] Patient Account Number: [id number] Date of Birth/Sex: Treating RN: Apr 24, 1961 (61 y.o. Nancy [name] Primary Care Clemon Devaul: [name] Other Clinician: Referring Imonie Tuch: Treating Raelee Rossmann/Extender: Nyra Market in Treatment: [place] reviewed with physician Active Inactive Nutrition Nursing Diagnoses: Impaired glucose control: actual or potential Potential for alteratiion in Nutrition/Potential for imbalanced nutrition Goals: Patient/caregiver will maintain therapeutic glucose control Date Initiated: 01/18/2022 Target Resolution Date: 02/15/2022 Goal Status: Active Interventions: Assess HgA1c results as ordered upon admission and as needed Assess patient nutrition upon admission and as needed per policy Provide education on elevated blood sugars and impact on wound healing Treatment Activities: Patient referred to Primary Care Physician for further nutritional evaluation : 01/18/2022 Notes: Wound/Skin Impairment Nursing Diagnoses: Impaired tissue integrity Knowledge deficit related to ulceration/compromised skin integrity Goals: Patient will have a decrease in wound volume by X% from date: (specify in notes) Date Initiated: 10/04/2021 Target Resolution Date: 02/09/2022 Goal Status: Active Patient/caregiver will verbalize understanding of skin care regimen Date Initiated:  10/04/2021 Target Resolution Date: 02/09/2022 Goal Status: Active Ulcer/skin breakdown will have a volume reduction of 30% by week 4 Date Initiated: 10/04/2021 Target Resolution Date: 02/09/2022 Goal Status: Active Interventions: Assess patient/caregiver ability to obtain necessary supplies Assess patient/caregiver ability to perform ulcer/skin care regimen upon admission and as needed Assess ulceration(s) every visit Notes: Electronic Signature(s) Signed: 02/05/2022 5:49:24 PM By: Dellie Catholic RN Entered By: Dellie Catholic on 02/05/2022 17:43:48 -------------------------------------------------------------------------------- Pain Assessment Details Patient Name: Date of Service: [name] 02/05/2022 9:45 A M Medical Record Number: [id number] Patient Account Number: [id number] Date of Birth/Sex: Treating RN: 16-Jun-1961 (61 y.o. Nancy [name] Primary Care Cailen Texeira: [name] Other Clinician: Referring Saffron Busey: Treating Maliek Schellhorn/Extender: Nyra Market in Treatment: 17 Active Problems Location of Pain Severity and Description of Pain Patient Has Paino Yes Site Locations Pain Location: Pain Location: Pain in Ulcers With Dressing Change: Yes Duration of the Pain. Constant /  Intermittento Constant Rate the pain. Current Pain Level: 3 Worst Pain Level: 9 Least Pain Level: 3 Tolerable Pain Level: 10 Character of Pain Describe the Pain: Sharp Pain Management and Medication Current Pain Management: Medication: No Cold Application: No Rest: Yes Massage: No Activity: No T.E.N.S.: No Heat Application: No Leg drop or elevation: No Is the Current Pain Management Adequate: Inadequate How does your wound impact your activities of daily livingo Sleep: No Bathing: No Appetite: No Relationship With Others: No Bladder Continence: No Emotions: No Bowel Continence: No Work: No Toileting: No Drive: No Dressing:  No Hobbies: No Electronic Signature(s) Signed: 02/05/2022 5:49:24 PM By: Dellie Catholic RN Entered By: Dellie Catholic on 02/05/2022 10:34:44 -------------------------------------------------------------------------------- Patient/Caregiver Education Details Patient Name: Date of Service: Nancy [place]oul[place]. 2/20/2023andnbsp9:45 A M Medical Record Number: [id number] Patient Account Number: [id number] Date of Birth/Gender: Treating RN: April 01, 1961 (61 y.o. Nancy [name] Primary Care Physician: [name] Other Clinician: Referring Physician: Treating Physician/Extender: Nyra Market in Treatment: 17 Education Assessment Education Provided To: Patient Education Topics Provided Elevated Blood Sugar/ Impact on Healing: Methods: Explain/Verbal Responses: Return demonstration correctly Electronic Signature(s) Signed: 02/05/2022 5:49:24 PM By: Dellie Catholic RN Entered By: Dellie Catholic on 02/05/2022 17:44:07 -------------------------------------------------------------------------------- Wound Assessment Details Patient Name: Date of Service: [name] 02/05/2022 9:45 A M Medical Record Number: [id number] Patient Account Number: [id number] Date of Birth/Sex: Treating RN: 12-25-1960 (61 y.o. Nancy [name] Primary Care Raevin Wierenga: [name] Other Clinician: Referring Arna Luis: Treating Maurine Mowbray/Extender: Nyra Market in Treatment: 17 Wound Status Wound Number: 2 Primary Diabetic Wound/Ulcer of the Lower Extremity Etiology: Wound Location: Left Calcaneus Wound Open Wounding Event: Gradually Appeared Status: Date Acquired: 08/04/2021 Comorbid Congestive Heart Failure, Hypertension, Peripheral Arterial Weeks Of Treatment: 17 History: Disease, Type II Diabetes, Dementia, Neuropathy Clustered Wound: No Photos Wound Measurements Length: (cm) 5.4 Width: (cm) 4.5 Depth: (cm)  1.2 Area: (cm) 19.085 Volume: (cm) 22.902 % Reduction in Area: 16.2% % Reduction in Volume: -905.4% Epithelialization: Small (1-33%) Undermining: Yes Starting Position (o'clock): 1 Ending Position (o'clock): 5 Maximum Distance: (cm) 1 Wound Description Classification: Grade 2 Wound Margin: Thickened Exudate Amount: Large Exudate Type: Serosanguineous Exudate Color: red, [name] Foul Odor After Cleansing: No Slough/Fibrino Yes Wound Bed Granulation Amount: Medium (34-66%) Exposed Structure Granulation Quality: Red, Pink, Friable Fascia Exposed: No Necrotic Amount: Medium (34-66%) Fat Layer (Subcutaneous Tissue) Exposed: Yes Necrotic Quality: Eschar, Adherent Slough Tendon Exposed: No Muscle Exposed: No Joint Exposed: No Bone Exposed: No Treatment Notes Wound #2 (Calcaneus) Wound Laterality: Left Cleanser Normal Saline Discharge Instruction: Cleanse the wound with Normal Saline prior to applying a clean dressing using gauze sponges, not tissue or cotton balls. Soap and Water Discharge Instruction: May shower and wash wound with dial antibacterial soap and water prior to dressing change. Wound Cleanser Discharge Instruction: Cleanse the wound with wound cleanser prior to applying a clean dressing using gauze sponges, not tissue or cotton balls. Peri-Wound Care Topical Primary Dressing KerraCel Ag Gelling Fiber Dressing, 4x5 in (silver alginate) Discharge Instruction: Apply silver alginate to wound bed as instructed Secondary Dressing Woven Gauze Sponge, Non-Sterile 4x4 in Discharge Instruction: Apply over primary dressing as directed. ABD Pad, 5x9 Discharge Instruction: Apply over primary dressing as directed. Secured With [employer], 4.5x3.1 (in/yd) Discharge Instruction: Secure with Kerlix as directed. 67M Medipore H Soft Cloth Surgical T ape, 4 x 10 (in/yd) Discharge Instruction: Secure with tape as directed. Compression Wrap Compression  Stockings  Add-Ons Electronic Signature(s) Signed: 02/05/2022 5:49:24 PM By: Dellie Catholic RN Entered By: Dellie Catholic on 02/05/2022 10:48:58 -------------------------------------------------------------------------------- Vitals Details Patient Name: Date of Service: [name] 02/05/2022 9:45 A M Medical Record Number: [id number] Patient Account Number: [id number] Date of Birth/Sex: Treating RN: 01/14/1961 (61 y.o. Nancy [name] Primary Care Orian Amberg: [name] Other Clinician: Referring Chiamaka Latka: Treating Ala Kratz/Extender: Nyra Market in Treatment: 17 Vital Signs Time Taken: 10:33 Temperature (F): 98.8 Height (in): 63 Pulse (bpm): 67 Weight (lbs): 232 Respiratory Rate (breaths/min): 18 Body Mass Index (BMI): 41.1 Blood Pressure (mmHg): 137/79 Reference Range: 80 - 120 mg / dl Electronic Signature(s) Signed: 02/05/2022 5:49:24 PM By: Dellie Catholic RN Entered By: Dellie Catholic on 02/05/2022 10:33:22

## 2022-02-05 NOTE — Progress Notes (Signed)
[name], [name] ([id number]) Visit Report for 02/01/2022 HPI Details Patient Name: Date of Service: [name] 02/01/2022 11:00 A M Medical Record Number: [id number] Patient Account Number: [id number] Date of Birth/Sex: Treating RN: 1961-04-24 (61 y.o. Nancy [name] Primary Care Provider: Karle [name] Other Clinician: Referring Provider: Treating Provider/Extender: Nancy [name] in Treatment: 30 History of Present Illness HPI Description: ADMISSION 10/04/2021 This is a 61 year old woman with multiple severe medical issues who arrives for review of wounds on her left foot. Recent medical history in epic shows a cellulitis of these left foot with gangrenous change and admission to hospital from 9/19 through 10/6. An MRI of the foot surprisingly did not show osteomyelitis. In the hospital she was treated with IV antibiotics and then discharged on Augmentin and doxycycline she has completed this. She also had noninvasive arterial studies that showed an ABI in the right of 0.45 on the left 0.46 monophasic waveforms bilaterally. They did not do TBI's. The patient was seen by Dr. Trula [name] in consult in the hospital. He felt she had evidence of arterial occlusive disease with an ABI on the left of 0.5 with monophasic waveforms at the ankle. Noted that any attempt at using dye in this patient would potentially push her into acute renal failure and with the patient who has been refusing dialysis this would be a fatal event. The patient saw palliative care but she was not interested in pursuing pure palliative care or SNF placement she has Medicaid pending but she does have home health The patient also was seen by nephrology in the hospital. She told them she did not want dialysis I think because her mother was on dialysis at 1 point. They did recommend a AV fistula. Again the patient refused. They noted deterioration from stage IV to stage V chronic  renal failure but they did not feel there was an urgent need for dialysis at that moment. The patient has a large boggy ischemic wound on most of her left heel. On the left lateral foot there is an open area with covering dry gangrene. On the dorsal foot just distal to the ankle there is a better looking wound surface that may have been a wrap injury at 1 point. The patient says that her heel and lateral foot have been there for about 2 months and the dorsal foot is uncertain. She has minimal tolerance with walking because of pain in her legs likely claudication. 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 Her discharge creatinine in the hospital was 2.75 with an estimated GFR of 19. It was much higher than this on presentation. Both vascular and Ortho recommended an amputation which the patient refused 11/3; since the patient was last here she was hospitalized for 5 days from 10/05/2021 through 10/10/2021 this was because of acute on chronic congestive heart failure. Post diuresis she was felt to have chronic kidney disease stage IV. Nephrology was consulted during the original admission and felt there was no indication for starting dialysis while an inpatient. Apparently has a follow-up with nephrologist Dr. Hollie [name] in 2 or 3 months. She did not see a vascular during this hospitalization although she previously saw Dr. Trula [name] at a point in time where she was apparently refusing dialysis although I have said previously I do not think she really understood what she was being told We have been using Betadine on the necrotic wounds  on the heel and the lateral foot and silver collagen on the dorsal foot wound on the left she does not have any wounds on the right. She comes in today with a necrotic eschar on the heel and the lateral foot separating visibly. 12/1; since the patient was  last here almost a month ago she was hospitalized from 10/24/2021 through 11/01/2021. 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 They correctly pointed out that her MRI was contraindicated but did not do anything beyond this. Orthopedic surgeons recommended an amputation which I am not sure that the patient agreed to. Also saw infectious disease who confirmed that "antibiotic treatment is not curative" she also had acute on chronic diastolic heart failure and now they are saying her chronic kidney disease stage IV is actually improved. I am not sure why they would not have addressed her vascular issues in the hospital. I looked back on her original arterial studies in September. These showed monophasic waveforms at the PTA and dorsalis pedis on the left with a ABI of 0.50. They are still using Dakin's wet-to-dry. 12/15Since the patient was last here I had a conversation with Dr. Gwenlyn [name]. He is going to go ahead with arterial Dopplers on the left leg to see if he can determine the level of obstruction accounting for the ABI of 0.5 during her initial hospitalization. I feel that if there is some target that we can look at it and the risk is acceptable to go ahead and do angiography on her. She understands that this very well could throw her into dialysis dependent chronic renal failure. I previously discussed this at length with her husband and her 2/2; almost a 6-week hiatus since we last saw this patient. Since last time she was here she was hospitalized from 12/22/2021 through 12/24/2021. At the time she was having a right arm AV shunt placed. She developed post operative respiratory distress requiring intubation she was transferred to the ICU. Indicates she now has a functioning shunt in the right arm but not ready for use. Her discharge summary written noted PAD with bilateral gangrenous pressure ulcers left greater  than right. She did not to my knowledge have wounds on the right leg but she was dealing with necrotic wounds on the left heel and the left lateral foot. She refused a BKA. Her husband is now using Dakin's wet-to-dry. She arrives in clinic today with the area on the left lateral foot healed she still has a substantial but generally better looking area on the plantar left heel She had recent noninvasive arterial studies on the left. These were on 01/06/2022. showing a 30 to 49% stenosis in the common femoral artery is 75 to 99% stenosis in the distal femoral artery and a 30- 49% stenosis in the popliteal artery. ABI in the left is 0.97 at the dorsalis pedis pulse with although all of her waveforms were monophasic. She had more significant findings on the right however she does not have an open wound here. Her husband's been using Dakin's wet-to-dry daily. The heel wound surface actually look quite good however she had thick cave then areas of skin and subcutaneous tissue around the margins She is back in clinic today. 2/16; this is a patient who I saw for the first time in 6 weeks 2 weeks ago. She had been in the hospital after going into respiratory failure at  the time of placement of her AV shunt. Surprisingly when I saw her 2 weeks ago the wound actually looked reasonably healthy with decent looking granulation. Her husband was using wet-to-dry with Dakin's. Our intake nurses noted that a lot of this was stuck to the surface of the wound and they thought he she might of been having Dakin's put on normal skin. In any case the wound bed look a lot worse than 2 weeks ago surface looks necrotic over a large area and there is now exposed bone I reviewed her arterial studies from 01/06/2021 on the left side her ABI at the dorsalis pedis was 0.97 somewhat better than previously recorded however her waveforms were monophasic. Arterial Dopplers showed a 75 to 99% stenosis of the distal left SFA, 30 to 49%  stenosis of the distal popliteal artery. Notable that other areas of stenosis could have been not visualized because of swelling Electronic Signature(s) Signed: 02/01/2022 5:55:17 PM By: Linton Ham MD Entered By: Linton Ham on 02/01/2022 12:30:01 -------------------------------------------------------------------------------- Physical Exam Details Patient Name: Date of Service: [name] 02/01/2022 11:00 A M Medical Record Number: [id number] Patient Account Number: [id number] Date of Birth/Sex: Treating RN: 09/22/1961 (61 y.o. Nancy [name] Primary Care Provider: Karle [name] Other Clinician: Referring Provider: Treating Provider/Extender: Nancy [name] in Treatment: 17 Constitutional Sitting or standing Blood Pressure is within target range for patient.. Pulse regular and within target range for patient.Marland Kitchen Respirations regular, non-labored and within target range.. Temperature is normal and within the target range for the patient.Marland Kitchen Appears in no distress. Cardiovascular Nonpalpable on the left. Tense bilateral edema in both legs extending well up into her thighs. Notes Wound exam; plantar left heel quite a deterioration from 2 weeks ago. Large area of the base of the wound is necrotic. There is exposed bone. No obvious soft tissue infection around the wound although this is deteriorated quite a bit from last time Electronic Signature(s) Signed: 02/01/2022 5:55:17 PM By: Linton Ham MD Entered By: Linton Ham on 02/01/2022 12:31:45 -------------------------------------------------------------------------------- Physician Orders Details Patient Name: Date of Service: [name] 02/01/2022 11:00 A M Medical Record Number: [id number] Patient Account Number: [id number] Date of Birth/Sex: Treating RN: 07-09-1961 (61 y.o. 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 - Dr Dellia Nims *** Return Tuesday please for follow-up appointment**** Return A Bathing/ Shower/ Hygiene May shower with protection but do not get wound dressing(s) wet. Edema Control - Lymphedema / SCD / Other Elevate legs to the level of the heart or above for 30 minutes daily and/or when sitting, a frequency of: Avoid standing for long periods of time. Moisturize legs daily. Off-Loading Heel suspension boot to: - Globoped heel offloading sandal to left foot ( to relieve pressure to left heel) Other: - Offloading boot to left foot; float heel/foot when sitting/laying down. Use pillow and place under your leg so your heel isnt laying against  bed!!!! Wound Treatment Wound #2 - Calcaneus Wound Laterality: Left Cleanser: Normal Saline (Home Health) 1 x Per Day/30 Days Discharge Instructions: Cleanse the wound with Normal Saline prior to applying a clean dressing using gauze sponges, not tissue or cotton balls. Cleanser: Soap and Water [facility]) 1 x Per Day/30 Days Discharge Instructions: May shower and wash wound with dial antibacterial soap and water prior to dressing change. Cleanser: Wound Cleanser (Home Health) 1 x Per Day/30 Days Discharge Instructions: Cleanse the wound with wound cleanser prior to applying a clean dressing using gauze sponges, not tissue or  cotton balls. Prim Dressing: KerraCel Ag Gelling Fiber Dressing, 4x5 in (silver alginate) 1 x Per Day/30 Days ary Discharge Instructions: Apply silver alginate to wound bed as instructed Secondary Dressing: Woven Gauze Sponge, Non-Sterile 4x4 in (Home Health) 1 x Per Day/30 Days Discharge Instructions: Apply over primary dressing as directed. Secondary Dressing: ABD Pad, 5x9 (Home Health) 1 x Per Day/30 Days Discharge Instructions: Apply over primary dressing as directed. Secured With: [employer], 4.5x3.1 (in/yd) (Home Health) 1 x Per Day/30 Days Discharge Instructions: Secure with Kerlix as directed. Secured With: 48M Medipore H Soft Cloth Surgical T ape, 4 x 10 (in/yd) (Home Health) 1 x Per Day/30 Days Discharge Instructions: Secure with tape as directed. Radiology X-ray, left heel - X-Ray Left Calcaneus for Non-healing wound - (ICD10 E11.621 - Type 2 diabetes mellitus with foot ulcer) Electronic Signature(s) Signed: 02/01/2022 5:55:17 PM By: Linton Ham MD Signed: 02/02/2022 12:44:49 PM By: Dellie Catholic RN Entered By: Dellie Catholic on 02/01/2022 12:17:57 Prescription 02/01/2022 -------------------------------------------------------------------------------- Benedetto Coons MD Patient Name: Provider: 11/26/1961 [id number] Date of Birth: NPI#Wanda [id number] [id number] Sex: DEA #: [phone] [id number] Phone #: License #: [place] Patient Address: [address] [place], [place] [address] [place], [place] [address] [phone] Allergies Sulfa (Sulfonamide Antibiotics); hydralazine; hydrocodone; metformin; lettuce; hydroxychloroquine sulfate; Shellfish Containing Products; shrimp; metformin Provider's Orders X-ray, left heel - ICD10: E11.621 - X-Ray Left Calcaneus for Non-healing wound Hand Signature: Date(s): Electronic Signature(s) Signed: 02/01/2022 5:55:17 PM By:  Linton Ham MD Signed: 02/02/2022 12:44:49 PM By: Dellie Catholic RN Entered By: Dellie Catholic on 02/01/2022 12:17:58 -------------------------------------------------------------------------------- Problem List Details Patient Name: Date of Service: [name] 02/01/2022 11:00 A M Medical Record Number: [id number] Patient Account Number: [id number] Date of Birth/Sex: Treating RN: 13-May-1961 (61 y.o. Valarie Cones, Mechele Claude Primary Care Provider: Karle [name] Other Clinician: Referring Provider: Treating Provider/Extender: Nancy [name] in Treatment: 17 Active Problems ICD-10 Encounter Code Description Active Date MDM Diagnosis E11.621 Type 2 diabetes mellitus with foot ulcer 10/04/2021 No Yes E11.52 Type 2 diabetes mellitus with diabetic peripheral angiopathy with gangrene 10/04/2021 No Yes L97.428 Non-pressure chronic ulcer of left heel and midfoot with other specified 10/04/2021 No Yes severity I12.0 Hypertensive chronic kidney disease with stage 5 chronic kidney disease or 10/04/2021 No Yes end stage renal disease Inactive Problems ICD-10 Code Description Active Date Inactive Date L97.528 Non-pressure chronic ulcer of other part of left foot with other specified severity 10/04/2021 10/04/2021 Resolved Problems Electronic Signature(s) Signed: 02/01/2022 5:55:17 PM By: Linton Ham MD Entered By: Linton Ham on 02/01/2022 12:24:06 -------------------------------------------------------------------------------- Progress Note Details Patient Name: Date of Service: [name] 02/01/2022 11:00 A M Medical Record Number: [id number] Patient Account Number: [id number] Date of Birth/Sex: Treating RN: Dec 29, 1960 (61 y.o. F) [place], Mechele Claude Primary  Care Provider: Karle [name] Other Clinician: Referring Provider: Treating Provider/Extender: Nancy [name] in Treatment: 17 Subjective History  of Present Illness (HPI) ADMISSION 10/04/2021 This is a 61 year old woman with multiple severe medical issues who arrives for review of wounds on her left foot. Recent medical history in epic shows a cellulitis of these left foot with gangrenous change and admission to hospital from 9/19 through 10/6. An MRI of the foot surprisingly did not show osteomyelitis. In the hospital she was treated with IV antibiotics and then discharged on Augmentin and doxycycline she has completed this. She also had noninvasive arterial studies that showed an ABI in the right of 0.45 on the left 0.46 monophasic waveforms bilaterally. They did not do TBI's. The patient was seen by Dr. Trula [name] in consult in the hospital. He felt she had evidence of arterial occlusive disease with an ABI on the left of 0.5 with monophasic waveforms at the ankle. Noted that any attempt at using dye in this patient would potentially push her into acute renal failure and with the patient who has been refusing dialysis this would be a fatal event. The patient saw palliative care but she was not interested in pursuing pure palliative care or SNF placement she has Medicaid pending but she does have home health The patient also was seen by nephrology in the hospital. She told them she did not want dialysis I think because her mother was on dialysis at 1 point. They did recommend a AV fistula. Again the patient refused. They noted deterioration from stage IV to stage V chronic renal failure but they did not feel there was an urgent need for dialysis at that moment. The patient has a large boggy ischemic wound on most of her left heel. On the left lateral foot there is an open area with covering dry gangrene. On the dorsal foot just distal to the ankle there is a better looking wound surface that may have been a wrap injury at 1 point. The patient says that her heel and lateral foot have been there for about 2 months and the dorsal foot is  uncertain. She has minimal tolerance with walking because of pain in her legs likely claudication. 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 Her discharge creatinine in the hospital was 2.75 with an estimated GFR of 19. It was much higher than this on presentation. Both vascular and Ortho recommended an amputation which the patient refused 11/3; since the patient was last here she was hospitalized for 5 days from 10/05/2021 through 10/10/2021 this was because of acute on chronic congestive heart failure. Post diuresis she was felt to have chronic kidney disease stage IV. Nephrology was consulted during the original admission and felt there was no indication for starting dialysis while an inpatient. Apparently has a follow-up with nephrologist Dr. Hollie [name] in 2 or 3 months. She did not see a vascular during this hospitalization although she previously saw Dr. Trula [name] at a point in time where she was apparently refusing dialysis although I have said previously I do not think she really understood what she was being told We have been using Betadine on the necrotic wounds on the heel and the lateral foot and silver collagen on the dorsal foot wound on the left she does not have any wounds on the right. She comes in today with a necrotic eschar on the heel and the lateral foot separating visibly. 12/1;  since the patient was last here almost a month ago she was hospitalized from 10/24/2021 through 11/01/2021. 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 They correctly pointed out that her MRI was contraindicated but did not do anything beyond this. Orthopedic surgeons recommended an amputation which I am not sure that the patient agreed to. Also saw infectious disease who confirmed that "antibiotic  treatment is not curative" she also had acute on chronic diastolic heart failure and now they are saying her chronic kidney disease stage IV is actually improved. I am not sure why they would not have addressed her vascular issues in the hospital. I looked back on her original arterial studies in September. These showed monophasic waveforms at the PTA and dorsalis pedis on the left with a ABI of 0.50. They are still using Dakin's wet-to-dry. 12/15Since the patient was last here I had a conversation with Dr. Gwenlyn [name]. He is going to go ahead with arterial Dopplers on the left leg to see if he can determine the level of obstruction accounting for the ABI of 0.5 during her initial hospitalization. I feel that if there is some target that we can look at it and the risk is acceptable to go ahead and do angiography on her. She understands that this very well could throw her into dialysis dependent chronic renal failure. I previously discussed this at length with her husband and her 2/2; almost a 6-week hiatus since we last saw this patient. Since last time she was here she was hospitalized from 12/22/2021 through 12/24/2021. At the time she was having a right arm AV shunt placed. She developed post operative respiratory distress requiring intubation she was transferred to the ICU. Indicates she now has a functioning shunt in the right arm but not ready for use. Her discharge summary written noted PAD with bilateral gangrenous pressure ulcers left greater than right. She did not to my knowledge have wounds on the right leg but she was dealing with necrotic wounds on the left heel and the left lateral foot. She refused a BKA. Her husband is now using Dakin's wet-to-dry. She arrives in clinic today with the area on the left lateral foot healed she still has a substantial but generally better looking area on the plantar left heel She had recent noninvasive arterial studies on the left. These were on 01/06/2022. showing  a 30 to 49% stenosis in the common femoral artery is 75 to 99% stenosis in the distal femoral artery and a 30- 49% stenosis in the popliteal artery. ABI in the left is 0.97 at the dorsalis pedis pulse with although all of her waveforms were monophasic. She had more significant findings on the right however she does not have an open wound here. Her husband's been using Dakin's wet-to-dry daily. The heel wound surface actually look quite good however she had thick cave then areas of skin and subcutaneous tissue around the margins She is back in clinic today. 2/16; this is a patient who I saw for the first time in 6 weeks 2 weeks ago. She had been in the hospital after going into respiratory failure at the time of placement of her AV shunt. Surprisingly when I saw her 2 weeks ago the wound actually looked reasonably healthy with decent looking granulation. Her husband was using wet-to-dry with Dakin's. Our intake nurses noted that a lot of this was stuck to  the surface of the wound and they thought he she might of been having Dakin's put on normal skin. In any case the wound bed look a lot worse than 2 weeks ago surface looks necrotic over a large area and there is now exposed bone I reviewed her arterial studies from 01/06/2021 on the left side her ABI at the dorsalis pedis was 0.97 somewhat better than previously recorded however her waveforms were monophasic. Arterial Dopplers showed a 75 to 99% stenosis of the distal left SFA, 30 to 49% stenosis of the distal popliteal artery. Notable that other areas of stenosis could have been not visualized because of swelling Objective Constitutional Sitting or standing Blood Pressure is within target range for patient.. Pulse regular and within target range for patient.Marland Kitchen Respirations regular, non-labored and within target range.. Temperature is normal and within the target range for the patient.Marland Kitchen Appears in no distress. Vitals Time Taken: 10:59 AM, Height: 63  in, Weight: 232 lbs, BMI: 41.1, Temperature: 98.8 F, Pulse: 73 bpm, Respiratory Rate: 20 breaths/min, Blood Pressure: 131/74 mmHg. Cardiovascular Nonpalpable on the left. Tense bilateral edema in both legs extending well up into her thighs. General Notes: Wound exam; plantar left heel quite a deterioration from 2 weeks ago. Large area of the base of the wound is necrotic. There is exposed bone. No obvious soft tissue infection around the wound although this is deteriorated quite a bit from last time Integumentary (Hair, Skin) Wound #2 status is Open. Original cause of wound was Gradually Appeared. The date acquired was: 08/04/2021. The wound has been in treatment 17 weeks. The wound is located on the Left Calcaneus. The wound measures 5.2cm length x 3.5cm width x 1cm depth; 14.294cm^2 area and 14.294cm^3 volume. There is Fat Layer (Subcutaneous Tissue) exposed. There is no tunneling noted, however, there is undermining starting at 9:00 and ending at 11:00 with a maximum distance of 1cm. There is a large amount of serosanguineous drainage noted. The wound margin is thickened. There is medium (34-66%) red granulation within the wound bed. There is a medium (34-66%) amount of necrotic tissue within the wound bed. 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 - Dr Dellia Nims *** Return Tuesday please for follow-up appointment**** Bathing/ Shower/ Hygiene: May shower with protection but do not get wound dressing(s) wet. Edema Control - Lymphedema / SCD / Other: Elevate legs to the level of the heart or above for 30 minutes daily and/or when sitting, a frequency  of: Avoid standing for long periods of time. Moisturize legs daily. Off-Loading: Heel suspension boot to: - Globoped heel offloading sandal to left foot ( to relieve pressure to left heel) Other: - Offloading boot to left foot; float heel/foot when sitting/laying down. Use pillow and place under your leg so your heel isnt laying against bed!!!! Radiology ordered were: X-ray, left heel - X-Ray Left Calcaneus for Non-healing wound WOUND #2: - Calcaneus Wound Laterality: Left Cleanser: Normal Saline ([place]) 1 x Per Day/30 Days Discharge Instructions: Cleanse the wound with Normal Saline prior to applying a clean dressing using gauze sponges, not tissue or cotton balls. Cleanser: Soap and Water [facility]) 1 x Per Day/30 Days Discharge Instructions: May shower and wash wound with dial  antibacterial soap and water prior to dressing change. Cleanser: Wound Cleanser (Home Health) 1 x Per Day/30 Days Discharge Instructions: Cleanse the wound with wound cleanser prior to applying a clean dressing using gauze sponges, not tissue or cotton balls. Prim Dressing: KerraCel Ag Gelling Fiber Dressing, 4x5 in (silver alginate) 1 x Per Day/30 Days ary Discharge Instructions: Apply silver alginate to wound bed as instructed Secondary Dressing: Woven Gauze Sponge, Non-Sterile 4x4 in (Home Health) 1 x Per Day/30 Days Discharge Instructions: Apply over primary dressing as directed. Secondary Dressing: ABD Pad, 5x9 (Home Health) 1 x Per Day/30 Days Discharge Instructions: Apply over primary dressing as directed. Secured With: [employer], 4.5x3.1 (in/yd) (Home Health) 1 x Per Day/30 Days Discharge Instructions: Secure with Kerlix as directed. Secured With: 79M Medipore H Soft Cloth Surgical T ape, 4 x 10 (in/yd) (Home Health) 1 x Per Day/30 Days Discharge Instructions: Secure with tape as directed. 74marked detereioration from 2 weeks ago 2 xray of the left heel 3 changed dressing to silver  alginate 4needs an angiogramooo Dr. Gwenlyn [name] 12massive edema in both legs on olasix 100 bid. not clear who is following 6 back early next week Electronic Signature(s) Signed: 02/01/2022 5:55:17 PM By: Linton Ham MD Entered By: Linton Ham on 02/01/2022 12:35:16 -------------------------------------------------------------------------------- SuperBill Details Patient Name: Date of Service: [place]. 02/01/2022 Medical Record Number: [id number] Patient Account Number: [id number] Date of Birth/Sex: Treating RN: 1961-01-28 (61 y.o. 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 I12.0 Hypertensive chronic kidney disease with stage 5 chronic kidney disease or end stage renal disease Facility Procedures CPT4 Code: [id number] Description: 99213 - WOUND CARE VISIT-LEV 3 EST PT Modifier: Quantity: 1 Physician Procedures : CPT4 Code Description Modifier [id number] - WC PHYS LEVEL 4 - EST PT ICD-10 Diagnosis Description E11.621 Type 2 diabetes mellitus with foot ulcer E11.52 Type 2 diabetes mellitus with diabetic peripheral angiopathy with gangrene L97.428 Non-pressure  chronic ulcer of left heel and midfoot with other specified severity I12.0 Hypertensive chronic kidney disease with stage 5 chronic kidney disease or end stage renal diseas Quantity: 1 e Electronic Signature(s) Signed: 02/02/2022 12:44:49 PM By: Dellie Catholic RN Signed: 02/05/2022 2:53:35 PM By: Linton Ham MD Previous Signature: 02/01/2022 5:55:17 PM Version By: Linton Ham MD Entered By: Dellie Catholic  on 02/01/2022 18:21:33

## 2022-02-06 ENCOUNTER — Telehealth (HOSPITAL_COMMUNITY): Payer: Self-pay | Admitting: Surgery

## 2022-02-06 ENCOUNTER — Ambulatory Visit (HOSPITAL_COMMUNITY)
Admission: RE | Admit: 2022-02-06 | Discharge: 2022-02-06 | Disposition: A | Discharge status: Home / Self Care | Payer: Medicaid Other | Source: Ambulatory Visit | Attending: Internal Medicine | Admitting: Internal Medicine

## 2022-02-06 DIAGNOSIS — I5022 Chronic systolic (congestive) heart failure: Secondary | ICD-10-CM | POA: Diagnosis present

## 2022-02-06 LAB — BASIC METABOLIC PANEL
Anion gap: 11 (ref 5–15)
BUN: 76 mg/dL — ABNORMAL HIGH (ref 8–23)
CO2: 26 mmol/L (ref 22–32)
Calcium: 8.7 mg/dL — ABNORMAL LOW (ref 8.9–10.3)
Chloride: 94 mmol/L — ABNORMAL LOW (ref 98–111)
Creatinine, Ser: 5.97 mg/dL — ABNORMAL HIGH (ref 0.44–1.00)
GFR, Estimated: 8 mL/min — ABNORMAL LOW (ref >60)
Glucose, Bld: 155 mg/dL — ABNORMAL HIGH (ref 70–99)
Potassium: 4.6 mmol/L (ref 3.5–5.1)
Sodium: 131 mmol/L — ABNORMAL LOW (ref 135–145)

## 2022-02-06 NOTE — Telephone Encounter (Signed)
-----   Message from Rafael Bihari, [place] sent at 02/06/2022  1:17 PM EST ----- SCr and BUN continue to rise, no change in meds. Please ensure she has Nephrology follow up

## 2022-02-06 NOTE — Telephone Encounter (Signed)
I called patient to review results.  She tells me that she thinks she has an appt with nephrologist and is unsure of who it is with.  I reviewed previous notes in chart and called [facility] to confirm her appt on March 9th at 10:25AM.  She will receive reminder calls from that office 3 days out.  I will also call and remind her now of the scheduled appt.

## 2022-02-06 NOTE — Progress Notes (Signed)
ADRIENNE, TROMBETTA ([id number]) Visit Report for 02/05/2022 Debridement Details Patient Name: Date of Service: [name] 02/05/2022 9:45 A M Medical Record Number: [id number] Patient Account Number: [id number] Date of Birth/Sex: Treating RN: Mar 30, 1961 (61 y.o. America Brown Primary Care Provider: [name] Other Clinician: Referring Provider: Treating Provider/Extender: Nyra Market in Treatment: 17 Debridement Performed for Assessment: Wound #2 Left Calcaneus Performed By: Physician Ricard Dillon., MD Debridement Type: Debridement Severity of Tissue Pre Debridement: Fat layer exposed Level of Consciousness (Pre-procedure): Awake and Alert Pre-procedure Verification/Time Out Yes - 11:15 Taken: Start Time: 11:15 Pain Control: Other : benzocaine 20% T Area Debrided (L x W): otal 1 (cm) x 1 (cm) = 1 (cm) Tissue and other material debrided: Non-Viable, Bone, Slough, Subcutaneous, Slough Level: Skin/Subcutaneous Tissue/Muscle/Bone Debridement Description: Excisional Instrument: Blade, Rongeur Specimen: Swab, Tissue Culture Number of Specimens T aken: 2 Bleeding: Large Hemostasis Achieved: Pressure End Time: 11:17 Procedural Pain: 0 Post Procedural Pain: 0 Response to Treatment: Procedure was tolerated well Level of Consciousness (Post- Awake and Alert procedure): Post Debridement Measurements of Total Wound Length: (cm) 5.4 Width: (cm) 4.5 Depth: (cm) 1.2 Volume: (cm) 22.902 Character of Wound/Ulcer Post Debridement: Improved Severity of Tissue Post Debridement: Fat layer exposed Post Procedure Diagnosis Same as Pre-procedure Electronic Signature(s) Signed: 02/05/2022 5:49:24 PM By: Dellie Catholic RN Signed: 02/06/2022 4:44:59 PM By: Linton Ham MD Previous Signature: 02/05/2022 2:53:35 PM Version By: Linton Ham MD Entered By: Dellie Catholic on 02/05/2022  17:46:17 -------------------------------------------------------------------------------- HPI Details Patient Name: Date of Service: [name] 02/05/2022 9:45 A M Medical Record Number: [id number] Patient Account Number: [id number] Date of Birth/Sex: Treating RN: 16-Aug-1961 (61 y.o. America Brown Primary Care Provider: [name] Other Clinician: Referring Provider: Treating Provider/Extender: Nyra Market in Treatment: 65 History of Present Illness HPI Description: ADMISSION 10/04/2021 This is a 61 year old woman with multiple severe medical issues who arrives for review of wounds on her left foot. Recent medical history in epic shows a cellulitis of these left foot with gangrenous change and admission to hospital from 9/19 through 10/6. An MRI of the foot surprisingly did not show osteomyelitis. In the hospital she was treated with IV antibiotics and then discharged on Augmentin and doxycycline she has completed this. She also had noninvasive arterial studies that showed an ABI in the right of 0.45 on the left 0.46 monophasic waveforms bilaterally. They did not do TBI's. The patient was seen by Dr. Trula Slade in consult in the hospital. He felt she had evidence of arterial occlusive disease with an ABI on the left of 0.5 with monophasic waveforms at the ankle. Noted that any attempt at using dye in this patient would potentially push her into acute renal failure and with the patient who has been refusing dialysis this would be a fatal event. The patient saw palliative care but she was not interested in pursuing pure palliative care or SNF placement she has Medicaid pending but she does have home health The patient also was seen by nephrology in the hospital. She told them she did not want dialysis I think because her mother was on dialysis at 1 point. They did recommend a AV fistula. Again the patient refused. They noted deterioration from  stage IV to stage V chronic renal failure but they did not feel there was an urgent need for dialysis at that moment. The patient has a large boggy ischemic wound on most of  her left heel. On the left lateral foot there is an open area with covering dry gangrene. On the dorsal foot just distal to the ankle there is a better looking wound surface that may have been a wrap injury at 1 point. The patient says that her heel and lateral foot have been there for about 2 months and the dorsal foot is uncertain. She has minimal tolerance with walking because of pain in her legs likely claudication. 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 Her discharge creatinine in the hospital was 2.75 with an estimated GFR of 19. It was much higher than this on presentation. Both vascular and Ortho recommended an amputation which the patient refused 11/3; since the patient was last here she was hospitalized for 5 days from 10/05/2021 through 10/10/2021 this was because of acute on chronic congestive heart failure. Post diuresis she was felt to have chronic kidney disease stage IV. Nephrology was consulted during the original admission and felt there was no indication for starting dialysis while an inpatient. Apparently has a follow-up with nephrologist Dr. Hollie Salk in 2 or 3 months. She did not see a vascular during this hospitalization although she previously saw Dr. Trula Slade at a point in time where she was apparently refusing dialysis although I have said previously I do not think she really understood what she was being told We have been using Betadine on the necrotic wounds on the heel and the lateral foot and silver collagen on the dorsal foot wound on the left she does not have any wounds on the right. She comes in today with a necrotic eschar on the heel and the lateral foot separating  visibly. 12/1; since the patient was last here almost a month ago she was hospitalized from 10/24/2021 through 11/01/2021. 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 They correctly pointed out that her MRI was contraindicated but did not do anything beyond this. Orthopedic surgeons recommended an amputation which I am not sure that the patient agreed to. Also saw infectious disease who confirmed that "antibiotic treatment is not curative" she also had acute on chronic diastolic heart failure and now they are saying her chronic kidney disease stage IV is actually improved. I am not sure why they would not have addressed her vascular issues in the hospital. I looked back on her original arterial studies in September. These showed monophasic waveforms at the PTA and dorsalis pedis on the left with a ABI of 0.50. They are still using Dakin's wet-to-dry. 12/15Since the patient was last here I had a conversation with Dr. Gwenlyn Found. He is going to go ahead with arterial Dopplers on the left leg to see if he can determine the level of obstruction accounting for the ABI of 0.5 during her initial hospitalization. I feel that if there is some target that we can look at it and the risk is acceptable to go ahead and do angiography on her. She understands that this very well could throw her into dialysis dependent chronic renal failure. I previously discussed this at length with her husband and her 2/2; almost a 6-week hiatus since we last saw this patient. Since last time she was here she was hospitalized from 12/22/2021 through 12/24/2021. At the time she was having a right arm AV shunt placed. She developed post operative respiratory  distress requiring intubation she was transferred to the ICU. Indicates she now has a functioning shunt in the right arm but not ready for use. Her discharge summary written noted PAD with bilateral  gangrenous pressure ulcers left greater than right. She did not to my knowledge have wounds on the right leg but she was dealing with necrotic wounds on the left heel and the left lateral foot. She refused a BKA. Her husband is now using Dakin's wet-to-dry. She arrives in clinic today with the area on the left lateral foot healed she still has a substantial but generally better looking area on the plantar left heel She had recent noninvasive arterial studies on the left. These were on 01/06/2022. showing a 30 to 49% stenosis in the common femoral artery is 75 to 99% stenosis in the distal femoral artery and a 30- 49% stenosis in the popliteal artery. ABI in the left is 0.97 at the dorsalis pedis pulse with although all of her waveforms were monophasic. She had more significant findings on the right however she does not have an open wound here. Her husband's been using Dakin's wet-to-dry daily. The heel wound surface actually look quite good however she had thick cave then areas of skin and subcutaneous tissue around the margins She is back in clinic today. 2/16; this is a patient who I saw for the first time in 6 weeks 2 weeks ago. She had been in the hospital after going into respiratory failure at the time of placement of her AV shunt. Surprisingly when I saw her 2 weeks ago the wound actually looked reasonably healthy with decent looking granulation. Her husband was using wet-to-dry with Dakin's. Our intake nurses noted that a lot of this was stuck to the surface of the wound and they thought he she might of been having Dakin's put on normal skin. In any case the wound bed look a lot worse than 2 weeks ago surface looks necrotic over a large area and there is now exposed bone I reviewed her arterial studies from 01/06/2021 on the left side her ABI at the dorsalis pedis was 0.97 somewhat better than previously recorded however her waveforms were monophasic. Arterial Dopplers showed a 75 to 99%  stenosis of the distal left SFA, 30 to 49% stenosis of the distal popliteal artery. Notable that other areas of stenosis could have been not visualized because of swelling 2/20; patient returns. The left heel plantar does not look good at allwith continued deterioration. Necrotic tissue. Exposed bone. She is essentially nonambulatory. X-ray ordered last week showed suggestions of only erosion and periosteal elevation compatible with osteomyelitis. We use silver alginate Electronic Signature(s) Signed: 02/05/2022 2:53:35 PM By: Linton Ham MD Entered By: Linton Ham on 02/05/2022 11:44:03 -------------------------------------------------------------------------------- Physical Exam Details Patient Name: Date of Service: [name] 02/05/2022 9:45 A M Medical Record Number: [id number] Patient Account Number: [id number] Date of Birth/Sex: Treating RN: [birth year] (61 y.o. America Brown Primary Care Provider: [name] Other Clinician: Referring Provider: Treating Provider/Extender: Nyra Market in Treatment: 17 Constitutional Sitting or standing Blood Pressure is within target range for patient.. Pulse regular and within target range for patient.Marland Kitchen Respirations regular, non-labored and within target range.. Temperature is normal and within the target range for the patient.Marland Kitchen Appears in no distress. Cardiovascular pedal pulses are not palpable. tense nonpitting edema in both lower legs. Notes exam; plantar left heel. Continued deterioration. There is exposed bone. Necrotic tissues superiorly I removed with  pickups and a #15 scalpel. Then using rongeurs I obtained a specimen of the bone for pathology and culture. Electronic Signature(s) Signed: 02/05/2022 2:53:35 PM By: Linton Ham MD Entered By: Linton Ham on 02/05/2022 11:45:35 -------------------------------------------------------------------------------- Physician Orders  Details Patient Name: Date of Service: [name] 02/05/2022 9:45 A M Medical Record Number: [id number] Patient Account Number: [id number] Date of Birth/Sex: Treating RN: 13-Nov-1961 (61 y.o. 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 - Dr Dellia Nims Return A Bathing/ Shower/ Hygiene May shower with protection but do not get wound dressing(s) wet. Edema Control - Lymphedema / SCD / Other Elevate legs to the level of the heart or above for 30 minutes daily and/or when sitting, a frequency of: Avoid standing for long periods of time. Moisturize legs daily. Off-Loading Heel suspension boot to: - Globoped heel offloading sandal to left foot ( to relieve pressure to left heel) Other: - Offloading boot to left foot; float heel/foot when sitting/laying down. Use pillow and place under your leg so your heel isnt laying against bed!!!! Wound Treatment Wound #2 - Calcaneus Wound Laterality: Left Cleanser: Normal Saline ([place]) 1 x Per Day/30 Days Discharge Instructions: Cleanse the wound with Normal Saline prior to applying a clean dressing using gauze sponges, not tissue or cotton balls. Cleanser: Soap and Water [facility]) 1 x Per Day/30 Days Discharge Instructions: May shower and wash wound with dial antibacterial soap and water prior to  dressing change. Cleanser: Wound Cleanser (Home Health) 1 x Per Day/30 Days Discharge Instructions: Cleanse the wound with wound cleanser prior to applying a clean dressing using gauze sponges, not tissue or cotton balls. Prim Dressing: KerraCel Ag Gelling Fiber Dressing, 4x5 in (silver alginate) 1 x Per Day/30 Days ary Discharge Instructions: Apply silver alginate to wound bed as instructed Secondary Dressing: Woven Gauze Sponge, Non-Sterile 4x4 in (Home Health) 1 x Per Day/30 Days Discharge Instructions: Apply over primary dressing as directed. Secondary Dressing: ABD Pad, 5x9 (Home Health) 1 x Per Day/30 Days Discharge Instructions: Apply over primary dressing as directed. Secured With: [employer], 4.5x3.1 (in/yd) (Home Health) 1 x Per Day/30 Days Discharge Instructions: Secure with Kerlix as directed. Secured With: 71M Medipore H Soft Cloth Surgical T ape, 4 x 10 (in/yd) (Home Health) 1 x Per Day/30 Days Discharge Instructions: Secure with tape as directed. Consults Vascular, Dr Gwenlyn Found - PAD, non-healing wound left heel - (ICD10 E11.621 - Type 2 diabetes mellitus with foot ulcer) Infectious Disease - Osteomyelitis in left heel - (ICD10 E11.621 - Type 2 diabetes mellitus with foot ulcer) Electronic Signature(s) Signed: 02/06/2022 12:34:06 PM By: Dellie Catholic RN Signed: 02/06/2022 4:44:59 PM By: Linton Ham MD Previous Signature: 02/05/2022 2:53:35 PM Version By: Linton Ham MD Previous Signature: 02/05/2022 5:49:24 PM Version By: Dellie Catholic RN Entered By: Dellie Catholic on 02/06/2022 07:39:45 Prescription 02/05/2022 -------------------------------------------------------------------------------- Benedetto Coons MD Patient Name: Provider: May 01, 1961 [id number] Date of Birth: NPI#: [id number] Sex: DEA #: [phone]  [id number] Phone #: License #: [place] Patient Address: [address] [place], [place] [address] [place], [place] [address] [phone] Allergies Sulfa (Sulfonamide Antibiotics); hydralazine; hydrocodone; metformin; lettuce; hydroxychloroquine sulfate; Shellfish Containing Products; shrimp; metformin Provider's Orders Vascular, Dr Gwenlyn Found - ICD10: E11.621 - PAD, non-healing wound left heel Hand Signature: Date(s): Prescription 02/05/2022 Benedetto Coons MD Patient Name: Provider: [birth year] [id number] Date of Birth: NPI#Wanda Plump [id number] Sex: DEA#: [phone] [id number] Phone #: License #: [place] Patient Address: [address] [place], [place] [address] [place], [place] [address] [phone] Allergies Sulfa (Sulfonamide Antibiotics); hydralazine; hydrocodone; metformin; lettuce; hydroxychloroquine sulfate; Shellfish Containing Products; shrimp; metformin Provider's Orders Infectious Disease - ICD10: E11.621 - Osteomyelitis in left heel Hand Signature: Date(s): Electronic Signature(s) Signed: 02/06/2022 12:34:06 PM By: Dellie Catholic RN Signed: 02/06/2022 4:44:59 PM By: Linton Ham MD Entered By: Dellie Catholic on 02/06/2022 07:39:45 -------------------------------------------------------------------------------- Problem List Details Patient Name: Date of Service: [address] H. 02/05/2022 9:45 A M Medical Record Number: [id number] Patient Account Number: [id number] Date of Birth/Sex: Treating RN: 21-Aug-1961 (61 y.o. America Brown Primary Care Provider: [name] Other Clinician: Referring Provider: Treating Provider/Extender: Nyra Market in Treatment: 17 Active Problems ICD-10 Encounter Code Description Active Date MDM Diagnosis E11.621 Type 2 diabetes mellitus with foot ulcer 10/04/2021 No Yes E11.52 Type 2 diabetes mellitus with diabetic  peripheral angiopathy with gangrene 10/04/2021 No Yes L97.428 Non-pressure chronic ulcer of left heel and midfoot with other specified 10/04/2021 No Yes severity Y65.035 Other chronic osteomyelitis, left ankle and foot 02/05/2022 No Yes I12.0 Hypertensive chronic kidney disease with stage 5 chronic kidney disease or 10/04/2021 No Yes end stage renal disease Inactive Problems ICD-10 Code Description Active Date Inactive Date L97.528 Non-pressure chronic ulcer of other part of left foot with other specified severity 10/04/2021 10/04/2021 Resolved Problems Electronic Signature(s) Signed: 02/05/2022 2:53:35 PM By: Linton Ham MD Entered By: Linton Ham on 02/05/2022 11:38:44 -------------------------------------------------------------------------------- Progress Note Details Patient Name: Date of Service: [name] 02/05/2022 9:45 A M Medical Record Number: [id number] Patient Account Number: [id number] Date of Birth/Sex: Treating RN: [birth year] (61 y.o. America Brown Primary Care Provider: [name] Other Clinician: Referring Provider: Treating Provider/Extender: Nyra Market in Treatment: 17 Subjective History of Present Illness (HPI) ADMISSION 10/04/2021 This is a 61 year old woman with multiple severe medical issues who arrives for review of wounds on her left foot. Recent medical history in epic shows a cellulitis of these left foot with gangrenous change and admission to hospital from 9/19 through 10/6. An MRI of the foot surprisingly did not show osteomyelitis. In the hospital she was treated with IV antibiotics and then discharged on Augmentin and doxycycline she has completed this. She also had noninvasive arterial studies that showed an ABI in the right of 0.45 on the left 0.46 monophasic waveforms bilaterally. They did not do TBI's. The patient was seen by Dr. Trula Slade in consult in the hospital. He felt she had evidence  of arterial occlusive disease with an ABI on the left of 0.5 with monophasic waveforms at the ankle. Noted that any attempt at using dye in this patient would potentially push her into acute renal failure and with the patient who has been refusing dialysis this would be a fatal  event. The patient saw palliative care but she was not interested in pursuing pure palliative care or SNF placement she has Medicaid pending but she does have home health The patient also was seen by nephrology in the hospital. She told them she did not want dialysis I think because her mother was on dialysis at 1 point. They did recommend a AV fistula. Again the patient refused. They noted deterioration from stage IV to stage V chronic renal failure but they did not feel there was an urgent need for dialysis at that moment. The patient has a large boggy ischemic wound on most of her left heel. On the left lateral foot there is an open area with covering dry gangrene. On the dorsal foot just distal to the ankle there is a better looking wound surface that may have been a wrap injury at 1 point. The patient says that her heel and lateral foot have been there for about 2 months and the dorsal foot is uncertain. She has minimal tolerance with walking because of pain in her legs likely claudication. 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 Her discharge creatinine in the hospital was 2.75 with an estimated GFR of 19. It was much higher than this on presentation. Both vascular and Ortho recommended an amputation which the patient refused 11/3; since the patient was last here she was hospitalized for 5 days from 10/05/2021 through 10/10/2021 this was because of acute on chronic congestive heart failure. Post diuresis she was felt to have chronic kidney disease stage IV. Nephrology was consulted during the original  admission and felt there was no indication for starting dialysis while an inpatient. Apparently has a follow-up with nephrologist Dr. Hollie Salk in 2 or 3 months. She did not see a vascular during this hospitalization although she previously saw Dr. Trula Slade at a point in time where she was apparently refusing dialysis although I have said previously I do not think she really understood what she was being told We have been using Betadine on the necrotic wounds on the heel and the lateral foot and silver collagen on the dorsal foot wound on the left she does not have any wounds on the right. She comes in today with a necrotic eschar on the heel and the lateral foot separating visibly. 12/1; since the patient was last here almost a month ago she was hospitalized from 10/24/2021 through 11/01/2021. 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 They correctly pointed out that her MRI was contraindicated but did not do anything beyond this. Orthopedic surgeons recommended an amputation which I am not sure that the patient agreed to. Also saw infectious disease who confirmed that "antibiotic treatment is not curative" she also had acute on chronic diastolic heart failure and now they are saying her chronic kidney disease stage IV is actually improved. I am not sure why they would not have addressed her vascular issues in the hospital. I looked back on her original arterial studies in September. These showed monophasic waveforms at the PTA and dorsalis pedis on the left with a ABI of 0.50. They are still using Dakin's wet-to-dry. 12/15Since the patient was last here I had a conversation with Dr. Gwenlyn Found. He is going to go ahead with arterial Dopplers on the left leg to see if he can determine the level  of obstruction accounting for the ABI of 0.5 during her initial hospitalization. I feel that if there is some target that we can  look at it and the risk is acceptable to go ahead and do angiography on her. She understands that this very well could throw her into dialysis dependent chronic renal failure. I previously discussed this at length with her husband and her 2/2; almost a 6-week hiatus since we last saw this patient. Since last time she was here she was hospitalized from 12/22/2021 through 12/24/2021. At the time she was having a right arm AV shunt placed. She developed post operative respiratory distress requiring intubation she was transferred to the ICU. Indicates she now has a functioning shunt in the right arm but not ready for use. Her discharge summary written noted PAD with bilateral gangrenous pressure ulcers left greater than right. She did not to my knowledge have wounds on the right leg but she was dealing with necrotic wounds on the left heel and the left lateral foot. She refused a BKA. Her husband is now using Dakin's wet-to-dry. She arrives in clinic today with the area on the left lateral foot healed she still has a substantial but generally better looking area on the plantar left heel She had recent noninvasive arterial studies on the left. These were on 01/06/2022. showing a 30 to 49% stenosis in the common femoral artery is 75 to 99% stenosis in the distal femoral artery and a 30- 49% stenosis in the popliteal artery. ABI in the left is 0.97 at the dorsalis pedis pulse with although all of her waveforms were monophasic. She had more significant findings on the right however she does not have an open wound here. Her husband's been using Dakin's wet-to-dry daily. The heel wound surface actually look quite good however she had thick cave then areas of skin and subcutaneous tissue around the margins She is back in clinic today. 2/16; this is a patient who I saw for the first time in 6 weeks 2 weeks ago. She had been in the hospital after going into respiratory failure at the time of placement of her AV shunt.  Surprisingly when I saw her 2 weeks ago the wound actually looked reasonably healthy with decent looking granulation. Her husband was using wet-to-dry with Dakin's. Our intake nurses noted that a lot of this was stuck to the surface of the wound and they thought he she might of been having Dakin's put on normal skin. In any case the wound bed look a lot worse than 2 weeks ago surface looks necrotic over a large area and there is now exposed bone I reviewed her arterial studies from 01/06/2021 on the left side her ABI at the dorsalis pedis was 0.97 somewhat better than previously recorded however her waveforms were monophasic. Arterial Dopplers showed a 75 to 99% stenosis of the distal left SFA, 30 to 49% stenosis of the distal popliteal artery. Notable that other areas of stenosis could have been not visualized because of swelling 2/20; patient returns. The left heel plantar does not look good at allwith continued deterioration. Necrotic tissue. Exposed bone. She is essentially nonambulatory. X-ray ordered last week showed suggestions of only erosion and periosteal elevation compatible with osteomyelitis. We use silver alginate Objective Constitutional Sitting or standing Blood Pressure is within target range for patient.. Pulse regular and within target range for patient.Marland Kitchen Respirations regular, non-labored and within target range.. Temperature is normal and within the target range for the patient.Marland Kitchen Appears in  no distress. Vitals Time Taken: 10:33 AM, Height: 63 in, Weight: 232 lbs, BMI: 41.1, Temperature: 98.8 F, Pulse: 67 bpm, Respiratory Rate: 18 breaths/min, Blood Pressure: 137/79 mmHg. Cardiovascular pedal pulses are not palpable. tense nonpitting edema in both lower legs. General Notes: exam; plantar left heel. Continued deterioration. There is exposed bone. Necrotic tissues superiorly I removed with pickups and a #15 scalpel. Then using rongeurs I obtained a specimen of the bone for  pathology and culture. Integumentary (Hair, Skin) Wound #2 status is Open. Original cause of wound was Gradually Appeared. The date acquired was: 08/04/2021. The wound has been in treatment 17 weeks. The wound is located on the Left Calcaneus. The wound measures 5.4cm length x 4.5cm width x 1.2cm depth; 19.085cm^2 area and 22.902cm^3 volume. There is Fat Layer (Subcutaneous Tissue) exposed. There is undermining starting at 1:00 and ending at 5:00 with a maximum distance of 1cm. There is a large amount of serosanguineous drainage noted. The wound margin is thickened. There is medium (34-66%) red, pink, friable granulation within the wound bed. There is a medium (34-66%) amount of necrotic tissue within the wound bed including Eschar and Adherent Slough. 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 There was a Excisional Skin/Subcutaneous Tissue/Muscle/Bone Debridement with a total area of 24.3 sq cm performed by Ricard Dillon., MD. With the following instrument(s): Blade, and Rongeur to remove Non-Viable tissue/material. Material removed includes Bone,Subcutaneous Tissue, and Slough after achieving pain control using Other (benzocaine 20%). 1 specimen was taken by a Tissue Culture and sent to the lab per facility protocol. A time out was conducted at 11:15, prior to the start of the procedure. A Large amount of bleeding was controlled with Pressure. The procedure was  tolerated well with a pain level of 0 throughout and a pain level of 0 following the procedure. Post Debridement Measurements: 5.4cm length x 4.5cm width x 1.2cm depth; 22.902cm^3 volume. Character of Wound/Ulcer Post Debridement is improved. Severity of Tissue Post Debridement is: Fat layer exposed. Post procedure Diagnosis Wound #2: Same as Pre-Procedure Plan Follow-up Appointments: Return Appointment in 1 week. - Dr Arcola Jansky Shower/ Hygiene: May shower with protection but do not get wound dressing(s) wet. Edema Control - Lymphedema / SCD / Other: Elevate legs to the level of the heart or above for 30 minutes daily and/or when sitting, a frequency of: Avoid standing for long periods of time. Moisturize legs daily. Off-Loading: Heel suspension boot to: - Globoped heel offloading sandal to left foot ( to relieve pressure to left heel) Other: - Offloading boot to left foot; float heel/foot when sitting/laying down. Use pillow and place under your leg so your heel isnt laying against bed!!!! WOUND #2: - Calcaneus Wound Laterality: Left Cleanser: Normal Saline ([place]) 1 x Per Day/30 Days Discharge Instructions: Cleanse the wound with Normal Saline prior to applying a clean dressing using gauze sponges, not tissue or cotton balls. Cleanser: Soap and Water [facility]) 1 x Per Day/30 Days Discharge Instructions: May shower and wash wound with dial antibacterial soap and  water prior to dressing change. Cleanser: Wound Cleanser (Home Health) 1 x Per Day/30 Days Discharge Instructions: Cleanse the wound with wound cleanser prior to applying a clean dressing using gauze sponges, not tissue or cotton balls. Prim Dressing: KerraCel Ag Gelling Fiber Dressing, 4x5 in (silver alginate) 1 x Per Day/30 Days ary Discharge Instructions: Apply silver alginate to wound bed as instructed Secondary Dressing: Woven Gauze Sponge, Non-Sterile 4x4 in (Home Health) 1 x Per Day/30 Days Discharge  Instructions: Apply over primary dressing as directed. Secondary Dressing: ABD Pad, 5x9 (Home Health) 1 x Per Day/30 Days Discharge Instructions: Apply over primary dressing as directed. Secured With: [employer], 4.5x3.1 (in/yd) (Home Health) 1 x Per Day/30 Days Discharge Instructions: Secure with Kerlix as directed. Secured With: 21M Medipore H Soft Cloth Surgical T ape, 4 x 10 (in/yd) (Home Health) 1 x Per Day/30 Days Discharge Instructions: Secure with tape as directed. #1 likely osteomyelitis as suggested by the x-ray I did last week 2 specimens for both pathology and culture #3 I talked to the patient and her husband about the likely need for amputation. I do not take these conversations lightly however with infection, some degree of PAD likely requiring an angiogram and the chronic renal insufficiency is really difficult for me to see [place] in area where this wound is going to heal. #4 I looked over her creatinines last on 01/26/22 at 5.59 earlier this year at 4.27. #5 Discussion again next week however if they continue to refuse the idea of left lower extremity amputation and I suspect this is what will happen, I think hospitalizing this woman may be in order. At least then she can have her arterial insufficiency evaluated, started on IV antibiotics and perhaps be evaluated by nephrology. she does have a shunt in her right arm Electronic Signature(s) Signed: 02/05/2022 2:53:35 PM By: Linton Ham MD Entered By: Linton Ham on 02/05/2022 11:51:36 -------------------------------------------------------------------------------- SuperBill Details Patient Name: Date of Service: [address] 02/05/2022 Medical Record Number: [id number] Patient Account Number: [id number] Date of Birth/Sex: Treating RN: May 22, 1961 (61 y.o. 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 M86.672 Other chronic osteomyelitis, left ankle and foot I12.0 Hypertensive chronic kidney disease with stage 5 chronic kidney disease or end stage renal disease Facility Procedures CPT4 Code: [id number] Description: 41962 - DEB BONE 20 SQ CM/< ICD-10 Diagnosis Description E11.621 Type 2 diabetes mellitus with foot ulcer M86.672 Other chronic osteomyelitis, left ankle and foot L97.428 Non-pressure chronic ulcer of left heel and midfoot with other  specified seve Modifier: rity Quantity: 1 Physician Procedures CPT4: Description Modifier Code [id number] Debridement; bone (includes epidermis, dermis, subQ tissue, muscle and/or fascia, if performed) 1st 20 sqcm or less ICD-10 Diagnosis Description E11.621 Type 2 diabetes mellitus with foot ulcer M86.672 Other  chronic osteomyelitis, left ankle and foot L97.428 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

## 2022-02-07 ENCOUNTER — Other Ambulatory Visit (HOSPITAL_COMMUNITY): Payer: Self-pay

## 2022-02-07 ENCOUNTER — Other Ambulatory Visit: Payer: Self-pay | Admitting: Internal Medicine

## 2022-02-07 NOTE — Telephone Encounter (Signed)
Requested medication (s) are due for refill today: yes  Requested medication (s) are on the active medication list: yes  Last refill:  11/06/21 #90 with 3 RF  Future visit scheduled: no, asked to return in 2 months  Notes to clinic:  Please assess, did not return in Jan, no upcoming visit scheduled.      Requested Prescriptions  Pending Prescriptions Disp Refills   calcium acetate (PHOSLO) 667 MG capsule [Pharmacy Med Name: CALCIUM ACETATE (PHOS BINDER) 667 MG ORAL CAPSULE] 90 capsule 3    Sig: Take 1 capsule (667 mg total) by mouth 3 (three) times daily with meals.     Endocrinology:  Minerals - Calcium Supplementation - calcium acetate Failed - 02/07/2022 12:47 PM      Failed - Phosphate in normal range and within 360 days    Phosphorus  Date Value Ref Range Status  12/23/2021 6.1 (H) 2.5 - 4.6 mg/dL Final          Failed - PTH in normal range and within 360 days    PTH Interp  Date Value Ref Range Status  09/07/2021 Comment  Final    Comment:    (NOTE) Interpretation                 Intact PTH    Calcium                                (pg/mL)      (mg/dL) Normal                          15 - 65     8.6 - 10.2 Primary Hyperparathyroidism         >65          >10.2 Secondary Hyperparathyroidism       >65          <10.2 Non-Parathyroid Hypercalcemia       <65          >10.2 Hypoparathyroidism                  <15          < 8.6 Non-Parathyroid Hypocalcemia    15 - 65          < 8.6 Performed At: [facility] [id number] [place], [place] [id number] Rush Farmer MD WU:[id number]    PTH  Date Value Ref Range Status  09/11/2021 83 (H) 15 - 65 pg/mL Final    Comment:    (NOTE) Performed At: [facility] [place], [place] [id number] Rush Farmer MD VH:[id number]           Failed - Ca in normal range and within 360 days    Calcium  Date Value Ref Range Status  02/06/2022 8.7 (L) 8.9 - 10.3 mg/dL Final   Calcium, Total (PTH)   Date Value Ref Range Status  09/07/2021 8.3 (L) 8.7 - 10.3 mg/dL Final   Calcium, Ion  Date Value Ref Range Status  12/22/2021 1.16 1.15 - 1.40 mmol/L Final          Passed - Valid encounter within last 12 months    Recent Outpatient Visits           3 months ago Hospital discharge follow-up   [name], MD   6 months ago [facility]  discharge follow-up   [place], MD   7 months ago Hospital discharge follow-up   [place], MD   1 year ago Uncontrolled type 2 diabetes mellitus with diabetic neuropathy, with long-term current use of insulin [facility])   [place], [place], NP   1 year ago Hospital discharge follow-up   [place], MD       Future Appointments             In 3 months Gwenlyn Found, Pearletha Forge, MD [place] [facility], [facility]

## 2022-02-07 NOTE — Progress Notes (Signed)
Paramedicine Encounter ° ° ° Patient ID: Nancy [name], female    DOB: 10/22/1961, 61 y.o.   MRN: [id number] ° °Arrived for home visit for Nancy [name] who reports feeling okay today she states she does feel a little more short of breath this week, but reports she has had no chest pain, dizziness. [name] did report that she has been eating fast food as well as drinking over 2 liters of fluid. I re-educated her and her husband on the importance of limiting her sodium and fluid levels. She and her husband agreed.  ° °Vitals and assessment as noted. Lower leg edema present as normal and lung sounds noted some slight rhonchi with some upper congestion noted upon coughing as well. No missed doses of metolazone or torsemide noted. Weight stable. Note husband does smoke in the home and ashtrays noted around the patient as well, patient denies smoking.   ° °Meds reviewed and confirmed. Pill box filled for one week.  ° °Appointments reviewed and confirmed. I wrote these down in her appointment book with dates, times and addresses and phone numbers.  ° °Home visit complete. ° °Refills will be delivered by [facility] tomorrow: ° °Amlodipine °Torsemide °Calcium °Atorvastatin  ° °Dexcom replaced today.  ° ° °Patient Care Team: °Johnson, Deborah B, MD as PCP - General (Internal Medicine) °Berry, Jonathan J, MD as PCP - Cardiology (Cardiology) °McLean, Dalton S, MD as PCP - Advanced Heart Failure (Cardiology) ° °Patient Active Problem List  ° Diagnosis Date Noted  ° S/P arteriovenous (AV) fistula creation 12/22/2021  ° Critical limb ischemia of both lower extremities (HCC) 11/22/2021  ° Osteomyelitis (HCC)   ° Peripheral artery disease (HCC) 10/27/2021  ° Acute exacerbation of congestive heart failure (HCC) 10/24/2021  ° Acute cardiogenic pulmonary edema (HCC) 10/06/2021  ° Coronary artery disease involving native coronary artery of native heart without angina pectoris 10/06/2021  ° Goals of care, counseling/discussion   ° Gangrene  of left foot (HCC)   ° Cellulitis of left foot   ° Diabetic ulcer of left foot (HCC) 09/04/2021  ° CHF (congestive heart failure) (HCC) 07/07/2021  ° Normocytic anemia 06/30/2021  ° CKD (chronic kidney disease), stage IV (HCC) 06/11/2021  ° Acute on chronic diastolic CHF (congestive heart failure) (HCC) 06/10/2021  ° Abnormal nuclear stress test   ° Dyspnea on exertion 03/07/2021  ° Orthopnea 02/25/2021  ° History of cerebrovascular accident (CVA) with residual deficit 05/06/2020  ° Diabetic peripheral neuropathy (HCC) 04/26/2020  ° AKI (acute kidney injury) (HCC) 04/20/2020  ° Generalized weakness 04/20/2020  ° Dehydration 04/20/2020  ° Generalized abdominal pain   ° Pancreatitis, acute 02/29/2020  ° Cerebrovascular accident (CVA) (HCC) 11/08/2019  ° Stenosis of right carotid artery 11/08/2019  ° Mixed diabetic hyperlipidemia associated with type 2 diabetes mellitus (HCC) 11/08/2019  ° Cortical age-related cataract of both eyes 08/30/2019  ° Gait abnormality 08/30/2019  ° Statin declined 08/30/2019  ° Gastroesophageal reflux disease without esophagitis 04/18/2018  ° 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  ° Type 2 diabetes mellitus with stage 4 chronic kidney disease, with long-term current use of insulin (HCC) 10/29/2017  ° History of macular degeneration 10/29/2017  ° Chronic cervical pain 10/29/2016  ° Myalgia 09/14/2016  ° Insomnia 09/14/2016  ° Tinea pedis of both feet 08/20/2016  ° Macular degeneration 08/17/2016  ° Low back pain 09/21/2014  ° Hypertensive urgency 09/21/2014  ° Diabetic gastroparesis associated with type 2 diabetes mellitus (HCC) 09/14/2014  ° Uncontrolled hypertension 09/08/2014  ° Thyroid nodule 08/17/2014  °   Obesity (BMI 30-39.9) 08/17/2014   Nausea & vomiting 08/04/2014   Type II diabetes mellitus with neurological manifestations, uncontrolled 08/04/2014    Current Outpatient Medications:    acetaminophen (TYLENOL) 500 MG tablet, Take 500 mg by mouth every 6 (six) hours as needed for moderate  pain or headache., Disp: , Rfl:    amLODipine (NORVASC) 10 MG tablet, Take 1 tablet (10 mg total) by mouth daily., Disp: 30 tablet, Rfl: 6   aspirin 81 MG EC tablet, Take 1 tablet (81 mg total) by mouth daily., Disp: 60 tablet, Rfl: 1   atorvastatin (LIPITOR) 40 MG tablet, Take 1 tablet (40 mg total) by mouth daily., Disp: 30 tablet, Rfl: 6   Blood Glucose Monitoring Suppl (ONETOUCH VERIO) w/Device KIT, Check blood sugar three times daily. E11.40, Disp: 1 kit, Rfl: 0   calcium acetate (PHOSLO) 667 MG capsule, Take 1 capsule (667 mg total) by mouth 3 (three) times daily with meals., Disp: 90 capsule, Rfl: 3   carvedilol (COREG) 12.5 MG tablet, Take 1.5 tablets (18.75 mg total) by mouth 2 (two) times daily with a meal., Disp: 90 tablet, Rfl: 6   Continuous Blood Gluc Receiver (DEXCOM G6 RECEIVER) DEVI, 1 Device by Does not apply route daily., Disp: 1 each, Rfl: 0   Continuous Blood Gluc Sensor (DEXCOM G6 SENSOR) MISC, 1 packet by Does not apply route daily., Disp: 3 each, Rfl: 11   Continuous Blood Gluc Transmit (DEXCOM G6 TRANSMITTER) MISC, 1 packet by Does not apply route daily., Disp: 1 each, Rfl: 4   ferrous sulfate 325 (65 FE) MG tablet, Take 1 tablet (325 mg total) by mouth daily with breakfast., Disp: 100 tablet, Rfl: 1   glucose blood test strip, Use as instructedCheck blood sugar three times daily. E11.40, Disp: 100 each, Rfl: 12   insulin glargine (LANTUS) 100 UNIT/ML Solostar Pen, Inject 30 Units into the skin at bedtime., Disp: 15 mL, Rfl: 3   insulin lispro (HUMALOG KWIKPEN) 100 UNIT/ML KwikPen, Inject 4 units before breakfast and dinner., Disp: 15 mL, Rfl: 3   Insulin Pen Needle 32G X 4 MM MISC, Use to inject insulin as directed., Disp: 100 each, Rfl: 4   metolazone (ZAROXOLYN) 2.5 MG tablet, Take 1 tablet (2.5 mg total) by mouth 3 (three) times a week. Monday, Wednesday and Friday, Disp: 15 tablet, Rfl: 6   potassium chloride SA (KLOR-CON M) 20 MEQ tablet, Take 2 tablets (40 mEq total)  by mouth 3 (three) times a week. Monday, Wednesday, and Friday, Disp: 30 tablet, Rfl: 6   risperiDONE (RISPERDAL) 3 MG tablet, TAKE 1 TABLET (3 MG TOTAL) BY MOUTH AT BEDTIME., Disp: 30 tablet, Rfl: 2   torsemide (DEMADEX) 20 MG tablet, Take 5 tablets (100 mg total) by mouth 2 (two) times daily., Disp: 150 tablet, Rfl: 6 Allergies  Allergen Reactions   Hydralazine Hcl Other (See Comments)    Hair loss   Hydrocodone Itching and Other (See Comments)    Upset stomach   Metformin And Related Nausea And Vomiting and Other (See Comments)    Stomach pains, also   Other Nausea Only and Other (See Comments)    Lettuce- Does not digest this!!   Plaquenil [Hydroxychloroquine Sulfate] Hives   Shellfish-Derived Products Nausea Only and Other (See Comments)    Caused an upset stomach   Shrimp (Diagnostic) Nausea Only and Other (See Comments)    Upset stomach    Sulfa Antibiotics Hives     Social History   Socioeconomic History  Marital status: Legally Separated  °  Spouse name: [name]  ° Number of children: 1  ° Years of education: some colle  ° Highest education level: Not on file  °Occupational History  ° Occupation: Employed FT as data analyst  °  Comment: [employer] , NA  °Tobacco Use  ° Smoking status: Former  °  Packs/day: 0.25  °  Years: 0.50  °  Pack years: 0.13  °  Types: Cigarettes  ° Smokeless tobacco: Never  ° Tobacco comments:  °  Former smoker 11/03/21  °Vaping Use  ° Vaping Use: Never used  °Substance and Sexual Activity  ° Alcohol use: No  ° Drug use: No  ° Sexual activity: Yes  °  Birth control/protection: None  °Other Topics Concern  ° Not on file  °Social History Narrative  ° 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   ° °Social Determinants of Health  ° °Financial Resource Strain: Low Risk   ° Difficulty of Paying Living Expenses: Not very hard  °Food Insecurity: No Food Insecurity  ° Worried About Running Out of Food in the Last Year: Never true  ° Ran  Out of Food in the Last Year: Never true  °Transportation Needs: No Transportation Needs  ° Lack of Transportation (Medical): No  ° Lack of Transportation (Non-Medical): No  °Physical Activity: Not on file  °Stress: Not on file  °Social Connections: Not on file  °Intimate Partner Violence: Not on file  ° ° °Physical Exam °Vitals reviewed.  °Constitutional:   °   Appearance: Normal appearance. She is normal weight.  °HENT:  °   Head: Normocephalic.  °   Nose: Nose normal.  °   Mouth/Throat:  °   Mouth: Mucous membranes are moist.  °   Pharynx: Oropharynx is clear.  °Eyes:  °   Conjunctiva/sclera: Conjunctivae normal.  °   Pupils: Pupils are equal, round, and reactive to light.  °Cardiovascular:  °   Rate and Rhythm: Normal rate and regular rhythm.  °   Pulses: Normal pulses.  °   Heart sounds: Normal heart sounds.  °Pulmonary:  °   Effort: Pulmonary effort is normal.  °   Breath sounds: Rhonchi present.  °Abdominal:  °   General: Abdomen is flat.  °   Palpations: Abdomen is soft.  °Musculoskeletal:     °   General: Swelling present. Normal range of motion.  °   Cervical back: Normal range of motion.  °   Right lower leg: Edema present.  °   Left lower leg: Edema present.  °Skin: °   General: Skin is warm and dry.  °   Capillary Refill: Capillary refill takes less than 2 seconds.  °Neurological:  °   General: No focal deficit present.  °   Mental Status: She is alert. Mental status is at baseline.  °Psychiatric:     °   Mood and Affect: Mood normal.  ° ° ° ° ° ° °Future Appointments  °Date Time Provider Department Center  °02/16/2022 12:30 PM Cannon, Jennifer, MD [facility]  °02/21/2022 10:00 AM Campbell, John, MD [facility]  °02/26/2022 11:00 AM [facility] PA/NP [facility] None  °03/14/2022  1:00 PM [facility] - [facility]  °03/14/2022  2:00 PM [facility] PA [facility]  °04/20/2022 10:40 AM McLean, Dalton S, MD [facility] None  °05/18/2022 11:15 AM Berry, Jonathan J, MD [facility]  ° ° ° °ACTION: °Home visit    completed ° ° ° ° ° ° °

## 2022-02-08 ENCOUNTER — Other Ambulatory Visit (HOSPITAL_COMMUNITY): Payer: Self-pay

## 2022-02-08 LAB — AEROBIC CULTURE W GRAM STAIN (SUPERFICIAL SPECIMEN)

## 2022-02-08 MED ORDER — CEPHALEXIN 500 MG PO CAPS
500.0000 mg | ORAL_CAPSULE | Freq: Two times a day (BID) | ORAL | 0 refills | Status: DC
  Filled 2022-02-08: qty 28, 14d supply, fill #0

## 2022-02-10 LAB — ANAEROBIC CULTURE W GRAM STAIN

## 2022-02-14 ENCOUNTER — Encounter (HOSPITAL_COMMUNITY): Payer: Self-pay

## 2022-02-14 ENCOUNTER — Inpatient Hospital Stay (HOSPITAL_COMMUNITY)
Admission: EM | Admit: 2022-02-14 | Discharge: 2022-02-21 | DRG: 252 | Disposition: A | Discharge status: Home / Self Care | Payer: Medicaid Other | Attending: Family Medicine | Admitting: Family Medicine

## 2022-02-14 ENCOUNTER — Emergency Department (HOSPITAL_COMMUNITY): Payer: Medicaid Other

## 2022-02-14 ENCOUNTER — Other Ambulatory Visit (HOSPITAL_COMMUNITY): Payer: Self-pay

## 2022-02-14 ENCOUNTER — Other Ambulatory Visit: Payer: Self-pay

## 2022-02-14 DIAGNOSIS — F32A Depression, unspecified: Secondary | ICD-10-CM | POA: Diagnosis present

## 2022-02-14 DIAGNOSIS — L89610 Pressure ulcer of right heel, unstageable: Secondary | ICD-10-CM | POA: Diagnosis present

## 2022-02-14 DIAGNOSIS — N186 End stage renal disease: Secondary | ICD-10-CM | POA: Diagnosis present

## 2022-02-14 DIAGNOSIS — Z6838 Body mass index (BMI) 38.0-38.9, adult: Secondary | ICD-10-CM | POA: Diagnosis not present

## 2022-02-14 DIAGNOSIS — K3184 Gastroparesis: Secondary | ICD-10-CM | POA: Diagnosis present

## 2022-02-14 DIAGNOSIS — L89620 Pressure ulcer of left heel, unstageable: Secondary | ICD-10-CM | POA: Diagnosis present

## 2022-02-14 DIAGNOSIS — Z882 Allergy status to sulfonamides status: Secondary | ICD-10-CM

## 2022-02-14 DIAGNOSIS — E785 Hyperlipidemia, unspecified: Secondary | ICD-10-CM | POA: Diagnosis present

## 2022-02-14 DIAGNOSIS — I5032 Chronic diastolic (congestive) heart failure: Secondary | ICD-10-CM | POA: Diagnosis present

## 2022-02-14 DIAGNOSIS — E875 Hyperkalemia: Secondary | ICD-10-CM | POA: Diagnosis present

## 2022-02-14 DIAGNOSIS — I509 Heart failure, unspecified: Secondary | ICD-10-CM | POA: Diagnosis not present

## 2022-02-14 DIAGNOSIS — Z87891 Personal history of nicotine dependence: Secondary | ICD-10-CM

## 2022-02-14 DIAGNOSIS — I2510 Atherosclerotic heart disease of native coronary artery without angina pectoris: Secondary | ICD-10-CM | POA: Diagnosis present

## 2022-02-14 DIAGNOSIS — R011 Cardiac murmur, unspecified: Secondary | ICD-10-CM | POA: Diagnosis present

## 2022-02-14 DIAGNOSIS — D631 Anemia in chronic kidney disease: Secondary | ICD-10-CM | POA: Diagnosis present

## 2022-02-14 DIAGNOSIS — Z992 Dependence on renal dialysis: Secondary | ICD-10-CM | POA: Diagnosis present

## 2022-02-14 DIAGNOSIS — Z20822 Contact with and (suspected) exposure to covid-19: Secondary | ICD-10-CM | POA: Diagnosis present

## 2022-02-14 DIAGNOSIS — N184 Chronic kidney disease, stage 4 (severe): Secondary | ICD-10-CM | POA: Diagnosis not present

## 2022-02-14 DIAGNOSIS — H3530 Unspecified macular degeneration: Secondary | ICD-10-CM | POA: Diagnosis present

## 2022-02-14 DIAGNOSIS — E11621 Type 2 diabetes mellitus with foot ulcer: Secondary | ICD-10-CM | POA: Diagnosis present

## 2022-02-14 DIAGNOSIS — E119 Type 2 diabetes mellitus without complications: Secondary | ICD-10-CM

## 2022-02-14 DIAGNOSIS — E1122 Type 2 diabetes mellitus with diabetic chronic kidney disease: Secondary | ICD-10-CM | POA: Diagnosis present

## 2022-02-14 DIAGNOSIS — Z635 Disruption of family by separation and divorce: Secondary | ICD-10-CM

## 2022-02-14 DIAGNOSIS — E1143 Type 2 diabetes mellitus with diabetic autonomic (poly)neuropathy: Secondary | ICD-10-CM | POA: Diagnosis present

## 2022-02-14 DIAGNOSIS — Z91013 Allergy to seafood: Secondary | ICD-10-CM

## 2022-02-14 DIAGNOSIS — I5033 Acute on chronic diastolic (congestive) heart failure: Secondary | ICD-10-CM | POA: Diagnosis present

## 2022-02-14 DIAGNOSIS — Z8249 Family history of ischemic heart disease and other diseases of the circulatory system: Secondary | ICD-10-CM

## 2022-02-14 DIAGNOSIS — E1142 Type 2 diabetes mellitus with diabetic polyneuropathy: Secondary | ICD-10-CM | POA: Diagnosis present

## 2022-02-14 DIAGNOSIS — E669 Obesity, unspecified: Secondary | ICD-10-CM | POA: Diagnosis present

## 2022-02-14 DIAGNOSIS — T82868A Thrombosis of vascular prosthetic devices, implants and grafts, initial encounter: Secondary | ICD-10-CM | POA: Diagnosis not present

## 2022-02-14 DIAGNOSIS — N049 Nephrotic syndrome with unspecified morphologic changes: Secondary | ICD-10-CM | POA: Diagnosis not present

## 2022-02-14 DIAGNOSIS — Z83438 Family history of other disorder of lipoprotein metabolism and other lipidemia: Secondary | ICD-10-CM

## 2022-02-14 DIAGNOSIS — Z8673 Personal history of transient ischemic attack (TIA), and cerebral infarction without residual deficits: Secondary | ICD-10-CM | POA: Diagnosis not present

## 2022-02-14 DIAGNOSIS — L8990 Pressure ulcer of unspecified site, unspecified stage: Secondary | ICD-10-CM | POA: Insufficient documentation

## 2022-02-14 DIAGNOSIS — I132 Hypertensive heart and chronic kidney disease with heart failure and with stage 5 chronic kidney disease, or end stage renal disease: Secondary | ICD-10-CM | POA: Diagnosis present

## 2022-02-14 DIAGNOSIS — R601 Generalized edema: Secondary | ICD-10-CM

## 2022-02-14 DIAGNOSIS — K589 Irritable bowel syndrome without diarrhea: Secondary | ICD-10-CM | POA: Diagnosis present

## 2022-02-14 DIAGNOSIS — Z833 Family history of diabetes mellitus: Secondary | ICD-10-CM | POA: Diagnosis not present

## 2022-02-14 DIAGNOSIS — Z8619 Personal history of other infectious and parasitic diseases: Secondary | ICD-10-CM

## 2022-02-14 DIAGNOSIS — Z885 Allergy status to narcotic agent status: Secondary | ICD-10-CM

## 2022-02-14 DIAGNOSIS — M7989 Other specified soft tissue disorders: Secondary | ICD-10-CM | POA: Diagnosis present

## 2022-02-14 DIAGNOSIS — Z888 Allergy status to other drugs, medicaments and biological substances status: Secondary | ICD-10-CM

## 2022-02-14 DIAGNOSIS — Z90710 Acquired absence of both cervix and uterus: Secondary | ICD-10-CM

## 2022-02-14 DIAGNOSIS — Z91018 Allergy to other foods: Secondary | ICD-10-CM

## 2022-02-14 DIAGNOSIS — N185 Chronic kidney disease, stage 5: Secondary | ICD-10-CM | POA: Diagnosis not present

## 2022-02-14 DIAGNOSIS — Z794 Long term (current) use of insulin: Secondary | ICD-10-CM

## 2022-02-14 DIAGNOSIS — Z7982 Long term (current) use of aspirin: Secondary | ICD-10-CM

## 2022-02-14 DIAGNOSIS — Z79899 Other long term (current) drug therapy: Secondary | ICD-10-CM

## 2022-02-14 LAB — HEPATIC FUNCTION PANEL
ALT: 9 U/L (ref 0–44)
AST: 13 U/L — ABNORMAL LOW (ref 15–41)
Albumin: 3.2 g/dL — ABNORMAL LOW (ref 3.5–5.0)
Alkaline Phosphatase: 46 U/L (ref 38–126)
Bilirubin, Direct: 0.2 mg/dL (ref 0.0–0.2)
Indirect Bilirubin: 0.4 mg/dL (ref 0.3–0.9)
Total Bilirubin: 0.6 mg/dL (ref 0.3–1.2)
Total Protein: 7.7 g/dL (ref 6.5–8.1)

## 2022-02-14 LAB — RESP PANEL BY RT-PCR (FLU A&B, COVID) ARPGX2
Influenza A by PCR: NEGATIVE
Influenza B by PCR: NEGATIVE
SARS Coronavirus 2 by RT PCR: NEGATIVE

## 2022-02-14 LAB — CREATININE, SERUM
Creatinine, Ser: 6.2 mg/dL — ABNORMAL HIGH (ref 0.44–1.00)
GFR, Estimated: 7 mL/min — ABNORMAL LOW

## 2022-02-14 LAB — BASIC METABOLIC PANEL
Anion gap: 10 (ref 5–15)
BUN: 72 mg/dL — ABNORMAL HIGH (ref 8–23)
CO2: 26 mmol/L (ref 22–32)
Calcium: 8.7 mg/dL — ABNORMAL LOW (ref 8.9–10.3)
Chloride: 95 mmol/L — ABNORMAL LOW (ref 98–111)
Creatinine, Ser: 6.03 mg/dL — ABNORMAL HIGH (ref 0.44–1.00)
GFR, Estimated: 7 mL/min — ABNORMAL LOW (ref >60)
Glucose, Bld: 97 mg/dL (ref 70–99)
Potassium: 6 mmol/L — ABNORMAL HIGH (ref 3.5–5.1)
Sodium: 131 mmol/L — ABNORMAL LOW (ref 135–145)

## 2022-02-14 LAB — CBC WITH DIFFERENTIAL/PLATELET
Abs Immature Granulocytes: 0.04 10*3/uL (ref 0.00–0.07)
Basophils Absolute: 0.1 10*3/uL (ref 0.0–0.1)
Basophils Relative: 1 %
Eosinophils Absolute: 0.6 10*3/uL — ABNORMAL HIGH (ref 0.0–0.5)
Eosinophils Relative: 6 %
HCT: 27 % — ABNORMAL LOW (ref 36.0–46.0)
Hemoglobin: 8.8 g/dL — ABNORMAL LOW (ref 12.0–15.0)
Immature Granulocytes: 0 %
Lymphocytes Relative: 15 %
Lymphs Abs: 1.4 10*3/uL (ref 0.7–4.0)
MCH: 28.9 pg (ref 26.0–34.0)
MCHC: 32.6 g/dL (ref 30.0–36.0)
MCV: 88.5 fL (ref 80.0–100.0)
Monocytes Absolute: 1 10*3/uL (ref 0.1–1.0)
Monocytes Relative: 10 %
Neutro Abs: 6.6 10*3/uL (ref 1.7–7.7)
Neutrophils Relative %: 68 %
Platelets: 320 10*3/uL (ref 150–400)
RBC: 3.05 MIL/uL — ABNORMAL LOW (ref 3.87–5.11)
RDW: 13 % (ref 11.5–15.5)
WBC: 9.6 10*3/uL (ref 4.0–10.5)
nRBC: 0 % (ref 0.0–0.2)

## 2022-02-14 LAB — CBG MONITORING, ED: Glucose-Capillary: 108 mg/dL — ABNORMAL HIGH (ref 70–99)

## 2022-02-14 LAB — BRAIN NATRIURETIC PEPTIDE: B Natriuretic Peptide: 176.4 pg/mL — ABNORMAL HIGH (ref 0.0–100.0)

## 2022-02-14 LAB — MAGNESIUM: Magnesium: 2.7 mg/dL — ABNORMAL HIGH (ref 1.7–2.4)

## 2022-02-14 MED ORDER — METOLAZONE 2.5 MG PO TABS: 2.5000 mg | ORAL_TABLET | ORAL | Status: DC

## 2022-02-14 MED ORDER — ASPIRIN EC 81 MG PO TBEC
81.0000 mg | DELAYED_RELEASE_TABLET | Freq: Every day | ORAL | Status: DC
  Administered 2022-02-15 – 2022-02-21 (×7): 81 mg via ORAL
  Filled 2022-02-14 (×7): qty 1

## 2022-02-14 MED ORDER — ALTEPLASE 2 MG IJ SOLR: 2.0000 mg | Freq: Once | INTRAMUSCULAR | Status: DC | PRN

## 2022-02-14 MED ORDER — CALCIUM ACETATE (PHOS BINDER) 667 MG PO CAPS
667.0000 mg | ORAL_CAPSULE | Freq: Three times a day (TID) | ORAL | Status: DC
  Administered 2022-02-16 – 2022-02-21 (×13): 667 mg via ORAL
  Filled 2022-02-14 (×14): qty 1

## 2022-02-14 MED ORDER — PENTAFLUOROPROP-TETRAFLUOROETH EX AERO
1.0000 "application " | INHALATION_SPRAY | CUTANEOUS | Status: DC | PRN
  Filled 2022-02-14: qty 116

## 2022-02-14 MED ORDER — HEPARIN SODIUM (PORCINE) 1000 UNIT/ML DIALYSIS
1000.0000 [IU] | INTRAMUSCULAR | Status: DC | PRN
  Filled 2022-02-14 (×2): qty 1

## 2022-02-14 MED ORDER — SODIUM CHLORIDE 0.9 % IV SOLN: 100.0000 mL | INTRAVENOUS | Status: DC | PRN

## 2022-02-14 MED ORDER — SODIUM ZIRCONIUM CYCLOSILICATE 10 G PO PACK
10.0000 g | PACK | Freq: Once | ORAL | Status: AC
Start: 2022-02-14 — End: 2022-02-14
  Administered 2022-02-14: 10 g via ORAL
  Filled 2022-02-14: qty 1

## 2022-02-14 MED ORDER — ACETAMINOPHEN 500 MG PO TABS: 500.0000 mg | ORAL_TABLET | Freq: Four times a day (QID) | ORAL | Status: DC | PRN

## 2022-02-14 MED ORDER — INSULIN GLARGINE 100 UNIT/ML SOLOSTAR PEN: 30.0000 [IU] | PEN_INJECTOR | Freq: Every day | SUBCUTANEOUS | Status: DC

## 2022-02-14 MED ORDER — CARVEDILOL 12.5 MG PO TABS
18.7500 mg | ORAL_TABLET | Freq: Two times a day (BID) | ORAL | Status: DC
  Administered 2022-02-14 – 2022-02-21 (×11): 18.75 mg via ORAL
  Filled 2022-02-14 (×3): qty 1
  Filled 2022-02-14: qty 2
  Filled 2022-02-14: qty 6
  Filled 2022-02-14 (×7): qty 1

## 2022-02-14 MED ORDER — SODIUM ZIRCONIUM CYCLOSILICATE 10 G PO PACK
10.0000 g | PACK | Freq: Two times a day (BID) | ORAL | Status: DC
  Administered 2022-02-15 – 2022-02-16 (×3): 10 g via ORAL
  Filled 2022-02-14 (×4): qty 1

## 2022-02-14 MED ORDER — INSULIN GLARGINE-YFGN 100 UNIT/ML ~~LOC~~ SOLN
30.0000 [IU] | Freq: Every day | SUBCUTANEOUS | Status: DC
  Administered 2022-02-14: 30 [IU] via SUBCUTANEOUS
  Filled 2022-02-14 (×2): qty 0.3

## 2022-02-14 MED ORDER — ONDANSETRON HCL 4 MG/2ML IJ SOLN
4.0000 mg | Freq: Four times a day (QID) | INTRAMUSCULAR | Status: DC | PRN
Start: 2022-02-14 — End: 2022-02-21
  Administered 2022-02-19: 4 mg via INTRAVENOUS
  Filled 2022-02-14: qty 2

## 2022-02-14 MED ORDER — CHLORHEXIDINE GLUCONATE CLOTH 2 % EX PADS
6.0000 | MEDICATED_PAD | Freq: Every day | CUTANEOUS | Status: DC
  Administered 2022-02-16 – 2022-02-20 (×5): 6 via TOPICAL

## 2022-02-14 MED ORDER — FERROUS SULFATE 325 (65 FE) MG PO TABS
325.0000 mg | ORAL_TABLET | Freq: Every day | ORAL | Status: DC
  Administered 2022-02-15: 325 mg via ORAL
  Filled 2022-02-14: qty 1

## 2022-02-14 MED ORDER — AMLODIPINE BESYLATE 10 MG PO TABS
10.0000 mg | ORAL_TABLET | Freq: Every day | ORAL | Status: DC
  Administered 2022-02-15 – 2022-02-21 (×7): 10 mg via ORAL
  Filled 2022-02-14 (×2): qty 1
  Filled 2022-02-14: qty 2
  Filled 2022-02-14 (×5): qty 1

## 2022-02-14 MED ORDER — HEPARIN SODIUM (PORCINE) 5000 UNIT/ML IJ SOLN
5000.0000 [IU] | Freq: Three times a day (TID) | INTRAMUSCULAR | Status: DC
  Administered 2022-02-14 – 2022-02-21 (×14): 5000 [IU] via SUBCUTANEOUS
  Filled 2022-02-14 (×16): qty 1

## 2022-02-14 MED ORDER — RISPERIDONE 3 MG PO TABS
3.0000 mg | ORAL_TABLET | Freq: Every day | ORAL | Status: DC
  Administered 2022-02-14 – 2022-02-20 (×7): 3 mg via ORAL
  Filled 2022-02-14 (×10): qty 1

## 2022-02-14 MED ORDER — FUROSEMIDE 10 MG/ML IJ SOLN
60.0000 mg | Freq: Once | INTRAMUSCULAR | Status: DC
  Filled 2022-02-14: qty 6

## 2022-02-14 MED ORDER — LIDOCAINE-PRILOCAINE 2.5-2.5 % EX CREA
1.0000 "application " | TOPICAL_CREAM | CUTANEOUS | Status: DC | PRN
  Filled 2022-02-14: qty 5

## 2022-02-14 MED ORDER — ATORVASTATIN CALCIUM 40 MG PO TABS
40.0000 mg | ORAL_TABLET | Freq: Every day | ORAL | Status: DC
  Administered 2022-02-15 – 2022-02-21 (×7): 40 mg via ORAL
  Filled 2022-02-14 (×7): qty 1

## 2022-02-14 MED ORDER — LIDOCAINE HCL (PF) 1 % IJ SOLN: 5.0000 mL | INTRAMUSCULAR | Status: DC | PRN

## 2022-02-14 MED ORDER — CEPHALEXIN 500 MG PO CAPS
500.0000 mg | ORAL_CAPSULE | Freq: Two times a day (BID) | ORAL | Status: DC
  Administered 2022-02-14 – 2022-02-21 (×13): 500 mg via ORAL
  Filled 2022-02-14 (×7): qty 1
  Filled 2022-02-14: qty 2
  Filled 2022-02-14 (×2): qty 1
  Filled 2022-02-14: qty 2
  Filled 2022-02-14 (×3): qty 1

## 2022-02-14 MED ORDER — FUROSEMIDE 10 MG/ML IJ SOLN
80.0000 mg | Freq: Two times a day (BID) | INTRAMUSCULAR | Status: DC
  Administered 2022-02-14 – 2022-02-16 (×4): 80 mg via INTRAVENOUS
  Filled 2022-02-14 (×4): qty 8

## 2022-02-14 MED ORDER — INSULIN ASPART 100 UNIT/ML IJ SOLN
0.0000 [IU] | Freq: Three times a day (TID) | INTRAMUSCULAR | Status: DC
  Administered 2022-02-16: 3 [IU] via SUBCUTANEOUS
  Administered 2022-02-16: 2 [IU] via SUBCUTANEOUS
  Administered 2022-02-17: 5 [IU] via SUBCUTANEOUS
  Administered 2022-02-18 (×2): 3 [IU] via SUBCUTANEOUS
  Administered 2022-02-19 – 2022-02-20 (×2): 2 [IU] via SUBCUTANEOUS
  Administered 2022-02-21: 3 [IU] via SUBCUTANEOUS

## 2022-02-14 MED ORDER — INSULIN ASPART 100 UNIT/ML IJ SOLN
0.0000 [IU] | Freq: Every day | INTRAMUSCULAR | Status: DC
  Administered 2022-02-16: 2 [IU] via SUBCUTANEOUS
  Administered 2022-02-20: 3 [IU] via SUBCUTANEOUS

## 2022-02-14 MED ORDER — ONDANSETRON HCL 4 MG PO TABS
4.0000 mg | ORAL_TABLET | Freq: Four times a day (QID) | ORAL | Status: DC | PRN
  Administered 2022-02-16: 4 mg via ORAL
  Filled 2022-02-14: qty 1

## 2022-02-14 NOTE — ED Provider Notes (Signed)
[facility] EMERGENCY DEPARTMENT Provider Note   CSN: [id number] Arrival date & time: 02/14/22  1427     History  Chief Complaint  Patient presents with   Leg Swelling    Nancy [name] is a 61 y.o. female with a past medical history of heart failure and CKD stage IV with recent dialysis graft placement in the right arm on 12/22/2021 who presents the emergency department with chief complaint of peripheral edema.  Patient states that she has been taking her torsemide as directed but for the past several weeks has had progressively worsening edema in the lower extremities now all the way up to her mid thigh.  She says that she is only short of breath with exertion denies any active shortness of breath.  Patient states that she is taking potassium however this medication is not listed in her medication list and I am unsure if the patient is fully reliable for this history.  Patient also has a wound on her left heel that she will not let me evaluate however she states this is being taken care of at the wound care center and her husband is changing the dressings daily.  He denies any fever, chills, chest pain.  HPI     Home Medications Prior to Admission medications   Medication Sig Start Date End Date Taking? Authorizing Provider  acetaminophen (TYLENOL) 500 MG tablet Take 500 mg by mouth every 6 (six) hours as needed for moderate pain or headache.    [provider]  amLODipine (NORVASC) 10 MG tablet Take 1 tablet (10 mg total) by mouth daily. 11/07/21   Larey Dresser, MD  aspirin 81 MG EC tablet Take 1 tablet (81 mg total) by mouth daily. 09/26/21   [place], Maricela Bo, FNP  atorvastatin (LIPITOR) 40 MG tablet Take 1 tablet (40 mg total) by mouth daily. 11/07/21   Larey Dresser, MD  Blood Glucose Monitoring Suppl [facility] VERIO) w/Device KIT Check blood sugar three times daily. E11.40 05/06/20   [name], MD  calcium acetate (PHOSLO) 667 MG  capsule TAKE 1 CAPSULE (667 MG TOTAL) BY MOUTH 3 (THREE) TIMES DAILY WITH MEALS. 02/08/22   [name], MD  carvedilol (COREG) 12.5 MG tablet Take 1.5 tablets (18.75 mg total) by mouth 2 (two) times daily with a meal. 11/07/21   Larey Dresser, MD  cephALEXin (KEFLEX) 500 MG capsule Take 1 capsule (500 mg total) by mouth every 12 (twelve) hours. 02/08/22   Ricard Dillon, MD  Continuous Blood Gluc Receiver (DEXCOM G6 RECEIVER) DEVI 1 Device by Does not apply route daily. 11/15/21   [name], MD  Continuous Blood Gluc Sensor (DEXCOM G6 SENSOR) MISC 1 packet by Does not apply route daily. 11/15/21   [name], MD  Continuous Blood Gluc Transmit (DEXCOM G6 TRANSMITTER) MISC 1 packet by Does not apply route daily. 11/15/21   [name], MD  ferrous sulfate 325 (65 FE) MG tablet Take 1 tablet (325 mg total) by mouth daily with breakfast. 11/07/21   [name], MD  glucose blood test strip Use as instructedCheck blood sugar three times daily. E11.40 07/11/20   [name], MD  insulin glargine (LANTUS) 100 UNIT/ML Solostar Pen Inject 30 Units into the skin at bedtime. 11/07/21   [name], MD  insulin lispro (HUMALOG KWIKPEN) 100 UNIT/ML KwikPen Inject 4 units before breakfast and dinner. 11/07/21   [name], MD  Insulin Pen Needle 32G X 4 MM MISC Use to inject insulin as directed. 11/07/21   [name], MD  metolazone (ZAROXOLYN) 2.5 MG tablet Take 1 tablet (2.5 mg total) by mouth 3 (three) times a week. Monday, Wednesday and Friday 01/26/22   Rafael Bihari, FNP  potassium chloride SA (KLOR-CON M) 20 MEQ tablet Take 2 tablets (40 mEq total) by mouth 3 (three) times a week. Monday, Wednesday, and Friday 01/26/22   Rafael Bihari, FNP  risperiDONE (RISPERDAL) 3 MG tablet TAKE 1 TABLET (3 MG TOTAL) BY MOUTH AT BEDTIME. 01/15/22 01/15/23  [name], MD  torsemide (DEMADEX) 20 MG tablet Take 5 tablets (100 mg total) by  mouth 2 (two) times daily. 01/05/22   Larey Dresser, MD  Insulin NPH Isophane & Regular (RELION 70/30 ) Inject 35 Units into the skin 2 (two) times daily.  08/17/14  [provider]      Allergies    Hydralazine hcl, Hydrocodone, Metformin and related, Other, Plaquenil [hydroxychloroquine sulfate], Shellfish-derived products, Shrimp (diagnostic), and Sulfa antibiotics    Review of Systems   Review of Systems As per the HPI Physical Exam Updated Vital Signs BP 140/66    Pulse 66    Temp 98 F (36.7 C) (Oral)    Resp 14    SpO2 100%  Physical Exam Vitals and nursing note reviewed.  Constitutional:      General: She is not in acute distress.    Appearance: She is well-developed. She is not diaphoretic.  HENT:     Head: Normocephalic and atraumatic.     Right Ear: External ear normal.     Left Ear: External ear normal.     Nose: Nose normal.     Mouth/Throat:     Mouth: Mucous membranes are moist.  Eyes:     General: No scleral icterus.    Conjunctiva/sclera: Conjunctivae normal.  Cardiovascular:     Rate and Rhythm: Normal rate and regular rhythm.     Heart sounds: Normal heart sounds. No murmur heard.   No friction rub. No gallop.  Pulmonary:     Effort: Pulmonary effort is normal. No respiratory distress.     Breath sounds: Normal breath sounds. Wheezes: minimal exp wheeze.  Abdominal:     General: Bowel sounds are normal. There is no distension.     Palpations: Abdomen is soft. There is no mass.     Tenderness: There is no abdominal tenderness. There is no guarding.  Musculoskeletal:     Cervical back: Normal range of motion.     Right lower leg: Edema present.     Left lower leg: Edema present.     Comments: Bilateral pitting edema up to the inguinal crease  Skin:    General: Skin is warm and dry.  Neurological:     Mental Status: She is alert and oriented to person, place, and time.  Psychiatric:        Behavior: Behavior normal.    ED Results /  Procedures / Treatments   Labs (all labs ordered are listed, but only abnormal results are displayed) Labs Reviewed  BASIC METABOLIC PANEL - Abnormal; Notable for the following components:      Result Value   Sodium 131 (*)    Potassium 6.0 (*)    Chloride 95 (*)    BUN 72 (*)    Creatinine, Ser 6.03 (*)    Calcium 8.7 (*)    GFR, Estimated 7 (*)  All other components within normal limits  CBC WITH DIFFERENTIAL/PLATELET - Abnormal; Notable for the following components:   RBC 3.05 (*)    Hemoglobin 8.8 (*)    HCT 27.0 (*)    Eosinophils Absolute 0.6 (*)    All other components within normal limits  BRAIN NATRIURETIC PEPTIDE - Abnormal; Notable for the following components:   B Natriuretic Peptide 176.4 (*)    All other components within normal limits  RESP PANEL BY RT-PCR (FLU A&B, COVID) ARPGX2  HEPATIC FUNCTION PANEL  I-STAT CHEM 8, ED    EKG EKG Interpretation  Date/Time:  Wednesday February 14 2022 15:02:54 EST Ventricular Rate:  65 PR Interval:  174 QRS Duration: 80 QT Interval:  422 QTC Calculation: 438 R Axis:   -36 Text Interpretation: Normal sinus rhythm Left axis deviation Abnormal ECG No significant change since last tracing Confirmed by Deno Etienne [id number]) on 02/14/2022 3:32:24 PM  Radiology DG Chest 2 View  Result Date: 02/14/2022 CLINICAL DATA:  Shortness of breath.  Leg swelling. EXAM: CHEST - 2 VIEW COMPARISON:  AP chest 12/22/2021 FINDINGS: Interval extubation. Cardiac silhouette and mediastinal contours are within normal limits. The lungs are clear with resolution of the prior bilateral heterogeneous opacities. No pleural effusion or pneumothorax. No acute skeletal abnormality. Likely cholecystectomy clips on lateral view. IMPRESSION: No active cardiopulmonary disease. Electronically Signed   By: Yvonne Kendall M.D.   On: 02/14/2022 15:31    Procedures Procedures    Medications Ordered in ED Medications - No data to display  ED Course/ Medical Decision  Making/ A&P Clinical Course as of 02/14/22 1737  Wed Feb 14, 2022  8338 Basic metabolic panel(!) Patient's BUN and creatinine appear to be just above baseline.  Her potassium is elevated at 6.  No comment about hemolysis present however will retest with a point-of-care Chem-8 [AH]  1635 Brain natriuretic peptide(!) BNP is not markedly elevated [AH]  1635 CBC with Differential(!) CBC with anemia at 8.8 this appears to be patient's baseline [AH]  1635 DG Chest 2 View I visualized the two-view chest x-ray which shows no evidence of edema [AH]  1637 ED EKG EKG shows normal sinus rhythm at a rate of 65 [AH]  1637 Patient here with anasarca.  I have added on a hepatic function panel to rule out hepatorenal syndrome.  Clearly her GFR is extraordinarily low and she has an elevated potassium.  I suspect that diuresing the patient will be complicated given her very poor kidney function.  I have placed a consult to speak with nephrology regarding her need for volume reduction in the setting of end-stage renal disease and elevated potassium level.  Patient will need admission [AH]  1720 Case discussed with Dr. Candiss Norse Of nephrology who will see the patient.  He recommends inpatient placement.  They will assess for inpatient dialysis at this time and recommends in the meantime beginning IV diuretics for diuresis. [AH]  1735 Case discussed with Dr. Doristine Bosworth the hospitalist service who will admit the patient.  I have ordered diuretics, Lasix 60 mg IV with strict intake and output measurements for the patient.  She will be admitted for further diuresis and nephrology evaluation. [AH]    Clinical Course User Index [AH] Margarita Mail, PA-C                           Medical Decision Making Patient here with peripheral edema, differential diagnosis includes venous insufficiency, heart failure, kidney  dysfunction, liver dysfunction.  In the patient's case I suspect she has worsening renal failure in the setting of  end-stage renal disease and heart failure leading to anasarca.  I have added on a hepatic function panel which is currently pending.  Of note the patient has mildly elevated potassium.  I discussed the case as documented in ED course with nephrology who recommends inpatient admission.  I also discussed the case with Dr. Doristine Bosworth of the hospital service who will admit the patient.  I have placed the patient on IV diuretics.  Amount and/or Complexity of Data Reviewed Labs: ordered. Decision-making details documented in ED Course. Radiology: independent interpretation performed. Decision-making details documented in ED Course. ECG/medicine tests: independent interpretation performed. Decision-making details documented in ED Course.    Details: Normal sinus rhythm at a rate of 65  Risk Prescription drug management. Decision regarding hospitalization.     Final Clinical Impression(s) / ED Diagnoses Final diagnoses:  None    Rx / DC Orders ED Discharge Orders     None         Margarita Mail, PA-C 02/15/22 0027    Deno Etienne, DO 02/15/22 1455

## 2022-02-14 NOTE — ED Triage Notes (Signed)
Pt here via [facility] from home for increased swelling in bilateral legs and abd, taking lasix as prescribed. Pt  c/o chronic back pain, denies cp/shob. 136/70, 65HR, 98% RA ?

## 2022-02-14 NOTE — H&P (Signed)
History and Physical    Patient: Nancy [name] QHK:[id number] DOB: [birth year] DOA: 02/14/2022 DOS: the patient was seen and examined on 02/14/2022 PCP: [name], MD  Patient coming from: Home  Chief Complaint:  Chief Complaint  Patient presents with   Leg Swelling    HPI: Nancy [name] is a 61 y.o. female with medical history significant of CKD stage V, hypertension, type 2 diabetes mellitus, CAD, chronic anemia, depression and many other comorbidities as listed below presented to ED with a complaint of bilateral leg swelling.  Per patient, she has been taking her diuretics as prescribed and she has been making urine as well however she has noticed gradually worsening bilateral lower extremity swelling for past couple of weeks.  She has also noticed to have some exertional shortness of breath but no shortness of breath at rest.  No other complaint such as fever, chills, sweating or any other complaint.  Reportedly patient had graft placement in right arm on 12/22/2021 however that is not mature yet.  Upon arrival to ED, she was found to have severe bilateral lower extremity edema, mild hyperkalemia with potassium of 6, creatinine fairly stable at her baseline.  No other lab abnormalities.  COVID-19 pending.  EDP has discussed with nephrology who is going to see this patient later today however patient is likely going to require dialysis.  EDP has been advised by nephrology to provide patient with IV Lasix.  Review of Systems: As mentioned in the history of present illness. All other systems reviewed and are negative. Past Medical History:  Diagnosis Date   Anemia 2006   Carotid artery disease ([place])    right ICA occlusion, 35-82% LICA 04/1897 [place]   CHF (congestive heart failure) ([place])    Chronic kidney disease    Coronary artery disease    Depression 2014   previously on amitryptiline    Diabetes mellitus with neurological manifestation ([place]) 2006   Diabetic peripheral  neuropathy ([place]) 04/26/2020   Dyspnea    Fracture of left ankle 1997   Gastroparesis 07/2016   Heart murmur    HOH (hard of hearing) 2004   Hyperlipidemia 2006   Hypertension 2006   IBS (irritable bowel syndrome) 2002   Leukopenia 2015   Macular degeneration 11/2019   Shingles 2009   Stroke [facility]) 2020   Thyroid nodule 2004   Past Surgical History:  Procedure Laterality Date   ABDOMINAL HYSTERECTOMY  2005   AV FISTULA PLACEMENT Right 12/22/2021   Procedure: RIGHT ARM ARTERIOVENOUS (AV) BRACIOCPHELAIC FISTULA CREATION;  Surgeon: Waynetta Sandy, MD;  Location: [facility];  Service: Vascular;  Laterality: Right;   CATARACT EXTRACTION Left 11/2019   CESAREAN SECTION  1983    CHOLECYSTECTOMY N/A 03/05/2020   Procedure: LAPAROSCOPIC CHOLECYSTECTOMY WITH INTRAOPERATIVE CHOLANGIOGRAM;  Surgeon: Donnie Mesa, MD;  Location: [facility];  Service: General;  Laterality: N/A;   ESOPHAGOGASTRODUODENOSCOPY (EGD) WITH PROPOFOL Left 08/26/2014   Procedure: ESOPHAGOGASTRODUODENOSCOPY (EGD) WITH PROPOFOL;  Surgeon: Arta Silence, MD;  Location: [facility];  Service: Endoscopy;  Laterality: Left;   ESOPHAGOGASTRODUODENOSCOPY (EGD) WITH PROPOFOL N/A 03/03/2020   Procedure: ESOPHAGOGASTRODUODENOSCOPY (EGD) WITH PROPOFOL;  Surgeon: Jerene Bears, MD;  Location: [facility];  Service: Gastroenterology;  Laterality: N/A;   RIGHT HEART CATH N/A 07/14/2021   Procedure: RIGHT HEART CATH;  Surgeon: Larey Dresser, MD;  Location: [facility] CV LAB;  Service: Cardiovascular;  Laterality: N/A;   RIGHT/LEFT HEART CATH AND CORONARY ANGIOGRAPHY N/A 04/27/2021   Procedure: RIGHT/LEFT  HEART CATH AND CORONARY ANGIOGRAPHY;  Surgeon: [name], MD;  Location: [facility] CV LAB;  Service: Cardiovascular;  Laterality: N/A;   Social History:  reports that she has quit smoking. Her smoking use included cigarettes. She has a 0.13 pack-year smoking history. She has never used smokeless tobacco. She reports that she  does not drink alcohol and does not use drugs.  Allergies  Allergen Reactions   Hydralazine Hcl Other (See Comments)    Hair loss   Hydrocodone Itching and Other (See Comments)    Upset stomach   Metformin And Related Nausea And Vomiting and Other (See Comments)    Stomach pains, also   Other Nausea Only and Other (See Comments)    Lettuce- Does not digest this!!   Plaquenil [Hydroxychloroquine Sulfate] Hives   Shellfish-Derived Products Nausea Only and Other (See Comments)    Caused an upset stomach   Shrimp (Diagnostic) Nausea Only and Other (See Comments)    Upset stomach    Sulfa Antibiotics Hives    Family History  Problem Relation Age of Onset   Hypertension Mother    Heart disease Mother    Diabetes Mother    Thyroid disease Mother    Congestive Heart Failure Mother    Breast cancer Maternal Grandmother    Colon cancer Maternal Grandfather    Heart attack Sister    Heart disease Brother    Hyperlipidemia Brother    Hypertension Brother    Diabetes Father    Breast cancer Maternal Aunt     Prior to Admission medications   Medication Sig Start Date End Date Taking? Authorizing Provider  acetaminophen (TYLENOL) 500 MG tablet Take 500 mg by mouth every 6 (six) hours as needed for moderate pain or headache.    [provider]  amLODipine (NORVASC) 10 MG tablet Take 1 tablet (10 mg total) by mouth daily. 11/07/21   Larey Dresser, MD  aspirin 81 MG EC tablet Take 1 tablet (81 mg total) by mouth daily. 09/26/21   [place]ford, Maricela Bo, FNP  atorvastatin (LIPITOR) 40 MG tablet Take 1 tablet (40 mg total) by mouth daily. 11/07/21   Larey Dresser, MD  Blood Glucose Monitoring Suppl [facility] VERIO) w/Device KIT Check blood sugar three times daily. E11.40 05/06/20   [name], MD  calcium acetate (PHOSLO) 667 MG capsule TAKE 1 CAPSULE (667 MG TOTAL) BY MOUTH 3 (THREE) TIMES DAILY WITH MEALS. 02/08/22   [name], MD  carvedilol (COREG) 12.5 MG  tablet Take 1.5 tablets (18.75 mg total) by mouth 2 (two) times daily with a meal. 11/07/21   Larey Dresser, MD  cephALEXin (KEFLEX) 500 MG capsule Take 1 capsule (500 mg total) by mouth every 12 (twelve) hours. 02/08/22   Ricard Dillon, MD  Continuous Blood Gluc Receiver (DEXCOM G6 RECEIVER) DEVI 1 Device by Does not apply route daily. 11/15/21   [name], MD  Continuous Blood Gluc Sensor (DEXCOM G6 SENSOR) MISC 1 packet by Does not apply route daily. 11/15/21   [name], MD  Continuous Blood Gluc Transmit (DEXCOM G6 TRANSMITTER) MISC 1 packet by Does not apply route daily. 11/15/21   [name], MD  ferrous sulfate 325 (65 FE) MG tablet Take 1 tablet (325 mg total) by mouth daily with breakfast. 11/07/21   [name], MD  glucose blood test strip Use as instructedCheck blood sugar three times daily. E11.40 07/11/20   [name], MD  insulin glargine (LANTUS) 100 UNIT/ML Solostar Pen Inject 30 Units into the skin at bedtime. 11/07/21   [name], MD  insulin lispro (HUMALOG KWIKPEN) 100 UNIT/ML KwikPen Inject 4 units before breakfast and dinner. 11/07/21   [name], MD  Insulin Pen Needle 32G X 4 MM MISC Use to inject insulin as directed. 11/07/21   [name], MD  metolazone (ZAROXOLYN) 2.5 MG tablet Take 1 tablet (2.5 mg total) by mouth 3 (three) times a week. Monday, Wednesday and Friday 01/26/22   Rafael Bihari, FNP  potassium chloride SA (KLOR-CON M) 20 MEQ tablet Take 2 tablets (40 mEq total) by mouth 3 (three) times a week. Monday, Wednesday, and Friday 01/26/22   Rafael Bihari, FNP  risperiDONE (RISPERDAL) 3 MG tablet TAKE 1 TABLET (3 MG TOTAL) BY MOUTH AT BEDTIME. 01/15/22 01/15/23  [name], MD  torsemide (DEMADEX) 20 MG tablet Take 5 tablets (100 mg total) by mouth 2 (two) times daily. 01/05/22   Larey Dresser, MD  Insulin NPH Isophane & Regular (RELION 70/30 [place]) Inject 35 Units into the skin 2  (two) times daily.  08/17/14  [provider]    Physical Exam: Vitals:   02/14/22 1438 02/14/22 1712  BP: 140/66 (!) 148/68  Pulse: 66 72  Resp: 14 18  Temp: 98 F (36.7 C)   TempSrc: Oral   SpO2: 100% 100%   General exam: Appears calm and comfortable  Respiratory system: Clear to auscultation. Respiratory effort normal. Cardiovascular system: S1 & S2 heard, RRR. No JVD, murmurs, rubs, gallops or clicks.  +3 pitting edema bilateral lower extremity Gastrointestinal system: Abdomen is nondistended, soft and nontender. No organomegaly or masses felt. Normal bowel sounds heard. Central nervous system: Alert and oriented. No focal neurological deficits. Extremities: Symmetric 5 x 5 power. Skin: No rashes, lesions or ulcers.  Psychiatry: Judgement and insight appear normal. Mood & affect appropriate.    Data Reviewed:  There are no new results to review at this time.  Assessment and Plan: No notes have been filed under this hospital service. Service: Hospitalist  Renal anasarca/progressively worsening CKD stage V, now ESRD: Despite of making good amount of urine, she is having worsening bilateral lower extremity edema.  She is likely going to require dialysis.  Nephrology is consulted.  Acute on chronic diastolic congestive heart failure: Has history of grade 2 diastolic dysfunction in the past.  Echo done only 5 months ago.  Chest x-ray clear but bilateral lower extremity edema.  She will need dialysis if she does not respond to diuretics in-house.  Per nephrology.  Hyperkalemia: 6.0.  I will go ahead and give her Lokelma 10 g.  Type 2 diabetes mellitus: She appears to be taking Lantus 30 units at night which I will resume and will place her on SSI.  Essential hypertension: Controlled.  Resume home medications.  Morbid obesity: Weight loss counseling provided.  HLD: Resume atorvastatin.  History of CAD: Asymptomatic.  Resume aspirin and Coreg.  Advance Care  Planning:   Code Status: Full Code   Consults: Nephrology  Family Communication: None present  Severity of Illness: The appropriate patient status for this patient is INPATIENT. Inpatient status is judged to be reasonable and necessary in order to provide the required intensity of service to ensure the patient's safety. The patient's presenting symptoms, physical exam findings, and initial radiographic and laboratory data in the context of their chronic comorbidities is felt to place them at high risk  for further clinical deterioration. Furthermore, it is not anticipated that the patient will be medically stable for discharge from the hospital within 2 midnights of admission.   * I certify that at the point of admission it is my clinical judgment that the patient will require inpatient hospital care spanning beyond 2 midnights from the point of admission due to high intensity of service, high risk for further deterioration and high frequency of surveillance required.*  Author: Darliss Cheney, MD 02/14/2022 5:50 PM  For on call review [email or web address].

## 2022-02-14 NOTE — ED Notes (Signed)
Received verbal report from Cyndia Skeeters RN at this time ?

## 2022-02-14 NOTE — ED Notes (Signed)
Attempted PIV x 3 unsuccessful order for IV team has been placed ?

## 2022-02-14 NOTE — ED Notes (Signed)
Per Dr. Bridgett Larsson, labs are to be held and drawn when cath for hemodialysis is placed.  ?

## 2022-02-14 NOTE — ED Notes (Signed)
Verbal report given to Vergia Alcon RN at this time. Pt will be moved to room 46 at this time ?

## 2022-02-14 NOTE — ED Notes (Addendum)
This RN notified by IV team RN that IV access was obtained but blood return limited d/t possibility for vascular collapse. Small blood sample obtained and sent to lab for processing. However, options for access are limited d/t extremity restrictions for hemodialysis. Will notify night floor coverage provider of the same.  ?

## 2022-02-14 NOTE — Progress Notes (Signed)
Paramedicine Encounter    Patient ID: Nancy [name], female    DOB: 03-22-1961, 61 y.o.   MRN: [id number]  Arrived for home visit for Mrs. Devlin who was seated in her chair alert but her head was down and she appeared short of breath. When I approached she would look up and talk very softly but she appeared to not be feeling well. Audibly I could hear some congestion and upon listening to lung sounds she noted to have rhonchi throughout. I obtained vitals and assessment. Lower legs swollen. Left foot drop hell boot in place. She is taking Cephlex for infection on left foot. She started this last week. She reports she has been compliant with all meds. Pill box was empty noting she had taken all meds as placed in box by me last week.   She took her scheduled metolazone this morning and reports peeing often today but when she gets up to pee she gets very fatigued and short of breath. Note her bedside commode is directly in front of her. Minimal movement and she is short of breath with audible rhonchi.   I called HF clinic to have them advise and Allena Katz agreed with evaluation for Mrs. Rehfeld to be seen in the ED. Mrs. Roark refused transport during our visit from [phone].  Pill box was filled accordingly with extra metolazone placed in pill box for tomorrow as per Allena Katz NP as well as 40MEQ of Potassium tomorrow.   I continued to advise her the importance of being seen in the ER and she continued to refuse for now but says she might go later.   Appointments were reviewed and home visit completed. I will continue to follow up.   At 1345 her husband called and notified me that she was ready to go to the ER so I called for an ambulance to take her to [facility]. I contacted [facility] ([name] ext. [phone]) and HF clinic to make them aware of same. I will follow up next week with Mrs. Cheever.   Refills: Carvedilol Ferrous Sulfate  Risperidone  CBG- 105      Patient Care Team: [name], MD as PCP - General (Internal Medicine) [name], MD as PCP - Cardiology (Cardiology) Larey Dresser, MD as PCP - Advanced Heart Failure (Cardiology)  Patient Active Problem List   Diagnosis Date Noted   S/P arteriovenous (AV) fistula creation 12/22/2021   Critical limb ischemia of both lower extremities ([place]) 11/22/2021   Osteomyelitis ([place])    Peripheral artery disease ([place]) 10/27/2021   Acute exacerbation of congestive heart failure ([place]) 10/24/2021   Acute cardiogenic pulmonary edema ([place]) 10/06/2021   Coronary artery disease involving native coronary artery of native heart without angina pectoris 10/06/2021   Goals of care, counseling/discussion    Gangrene of left foot ([place])    Cellulitis of left foot    Diabetic ulcer of left foot ([place]) 09/04/2021   CHF (congestive heart failure) ([place]) 07/07/2021   Normocytic anemia 06/30/2021   CKD (chronic kidney disease), stage IV ([place]) 06/11/2021   Acute on chronic diastolic CHF (congestive heart failure) ([place]) 06/10/2021   Abnormal nuclear stress test    Dyspnea on exertion 03/07/2021   Orthopnea 02/25/2021   History of cerebrovascular accident (CVA) with residual deficit 05/06/2020   Diabetic peripheral neuropathy ([place]) 04/26/2020   AKI (acute kidney injury) ([place]) 04/20/2020   Generalized weakness 04/20/2020   Dehydration 04/20/2020   Generalized abdominal pain  Pancreatitis, acute 02/29/2020   Cerebrovascular accident (CVA) ([place]) 11/08/2019   Stenosis of right carotid artery 11/08/2019   Mixed diabetic hyperlipidemia associated with type 2 diabetes mellitus ([place]) 11/08/2019   Cortical age-related cataract of both eyes 08/30/2019   Gait abnormality 08/30/2019   Statin declined 08/30/2019   Gastroesophageal reflux disease without esophagitis 04/18/2018   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   Type 2 diabetes mellitus with stage 4 chronic kidney disease, with long-term current use of  insulin ([place]) 10/29/2017   History of macular degeneration 10/29/2017   Chronic cervical pain 10/29/2016   Myalgia 09/14/2016   Insomnia 09/14/2016   Tinea pedis of both feet 08/20/2016   Macular degeneration 08/17/2016   Low back pain 09/21/2014   Hypertensive urgency 09/21/2014   Diabetic gastroparesis associated with type 2 diabetes mellitus ([place]) 09/14/2014   Uncontrolled hypertension 09/08/2014   Thyroid nodule 08/17/2014   Obesity (BMI 30-39.9) 08/17/2014   Nausea & vomiting 08/04/2014   Type II diabetes mellitus with neurological manifestations, uncontrolled 08/04/2014    Current Outpatient Medications:    acetaminophen (TYLENOL) 500 MG tablet, Take 500 mg by mouth every 6 (six) hours as needed for moderate pain or headache., Disp: , Rfl:    amLODipine (NORVASC) 10 MG tablet, Take 1 tablet (10 mg total) by mouth daily., Disp: 30 tablet, Rfl: 6   aspirin 81 MG EC tablet, Take 1 tablet (81 mg total) by mouth daily., Disp: 60 tablet, Rfl: 1   atorvastatin (LIPITOR) 40 MG tablet, Take 1 tablet (40 mg total) by mouth daily., Disp: 30 tablet, Rfl: 6   Blood Glucose Monitoring Suppl (ONETOUCH VERIO) w/Device KIT, Check blood sugar three times daily. E11.40, Disp: 1 kit, Rfl: 0   calcium acetate (PHOSLO) 667 MG capsule, TAKE 1 CAPSULE (667 MG TOTAL) BY MOUTH 3 (THREE) TIMES DAILY WITH MEALS., Disp: 90 capsule, Rfl: 3   carvedilol (COREG) 12.5 MG tablet, Take 1.5 tablets (18.75 mg total) by mouth 2 (two) times daily with a meal., Disp: 90 tablet, Rfl: 6   cephALEXin (KEFLEX) 500 MG capsule, Take 1 capsule (500 mg total) by mouth every 12 (twelve) hours., Disp: 28 capsule, Rfl: 0   Continuous Blood Gluc Receiver (DEXCOM G6 RECEIVER) DEVI, 1 Device by Does not apply route daily., Disp: 1 each, Rfl: 0   Continuous Blood Gluc Sensor (DEXCOM G6 SENSOR) MISC, 1 packet by Does not apply route daily., Disp: 3 each, Rfl: 11   Continuous Blood Gluc Transmit (DEXCOM G6 TRANSMITTER) MISC, 1 packet by  Does not apply route daily., Disp: 1 each, Rfl: 4   ferrous sulfate 325 (65 FE) MG tablet, Take 1 tablet (325 mg total) by mouth daily with breakfast., Disp: 100 tablet, Rfl: 1   glucose blood test strip, Use as instructedCheck blood sugar three times daily. E11.40, Disp: 100 each, Rfl: 12   insulin glargine (LANTUS) 100 UNIT/ML Solostar Pen, Inject 30 Units into the skin at bedtime., Disp: 15 mL, Rfl: 3   insulin lispro (HUMALOG KWIKPEN) 100 UNIT/ML KwikPen, Inject 4 units before breakfast and dinner., Disp: 15 mL, Rfl: 3   Insulin Pen Needle 32G X 4 MM MISC, Use to inject insulin as directed., Disp: 100 each, Rfl: 4   metolazone (ZAROXOLYN) 2.5 MG tablet, Take 1 tablet (2.5 mg total) by mouth 3 (three) times a week. Monday, Wednesday and Friday, Disp: 15 tablet, Rfl: 6   potassium chloride SA (KLOR-CON M) 20 MEQ tablet, Take 2 tablets (40 mEq total) by mouth 3 (three) times  a week. Monday, Wednesday, and Friday, Disp: 30 tablet, Rfl: 6   risperiDONE (RISPERDAL) 3 MG tablet, TAKE 1 TABLET (3 MG TOTAL) BY MOUTH AT BEDTIME., Disp: 30 tablet, Rfl: 2   torsemide (DEMADEX) 20 MG tablet, Take 5 tablets (100 mg total) by mouth 2 (two) times daily., Disp: 150 tablet, Rfl: 6 Allergies  Allergen Reactions   Hydralazine Hcl Other (See Comments)    Hair loss   Hydrocodone Itching and Other (See Comments)    Upset stomach   Metformin And Related Nausea And Vomiting and Other (See Comments)    Stomach pains, also   Other Nausea Only and Other (See Comments)    Lettuce- Does not digest this!!   Plaquenil [Hydroxychloroquine Sulfate] Hives   Shellfish-Derived Products Nausea Only and Other (See Comments)    Caused an upset stomach   Shrimp (Diagnostic) Nausea Only and Other (See Comments)    Upset stomach    Sulfa Antibiotics Hives     Social History   Socioeconomic History   Marital status: Legally Separated    Spouse name: [name]   Number of children: 1   Years of education: some colle    Highest education level: Not on file  Occupational History   Occupation: Employed FT as Landscape architect    Comment: [employer] , NA  Tobacco Use   Smoking status: Former    Packs/day: 0.25    Years: 0.50    Pack years: 0.13    Types: Cigarettes   Smokeless tobacco: Never   Tobacco comments:    Former smoker 11/03/21  Vaping Use   Vaping Use: Never used  Substance and Sexual Activity   Alcohol use: No   Drug use: No   Sexual activity: Yes    Birth control/protection: None  Other Topics Concern   Not on file  Social History Narrative   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  Constitutional:      Appearance: Normal appearance. She is normal weight.  HENT:     Head: Normocephalic.     Nose: Nose normal.     Mouth/Throat:     Mouth: Mucous membranes are dry.     Pharynx: Oropharynx is clear.  Eyes:     Conjunctiva/sclera: Conjunctivae normal.     Pupils: Pupils are equal, round, and reactive to light.  Cardiovascular:     Rate and Rhythm: Normal rate and regular rhythm.     Pulses: Normal pulses.     Heart sounds: Normal heart sounds.  Pulmonary:     Effort: Respiratory distress present.     Breath sounds: Rhonchi present.  Abdominal:     General: There is distension.  Musculoskeletal:        General: Swelling present. Normal range of motion.     Cervical back: Normal range  of motion.  Right lower leg: Edema present.     Left lower leg: Edema present.  Skin:    General: Skin is warm and dry.     Capillary Refill: Capillary refill takes less than 2 seconds.  Neurological:     General: No focal deficit present.     Mental Status: She is alert. Mental status is at baseline.  Psychiatric:        Mood and Affect: Mood normal.        Future Appointments  Date Time Provider [place]  02/16/2022 12:30 PM Fredirick Maudlin, MD [facility]  02/21/2022 10:00 AM Michel Bickers, MD [facility]  02/26/2022 11:00 AM [facility] PA/NP [facility] None  03/14/2022  1:00 PM MC-CV HS VASC 2 - [facility]  03/14/2022  2:00 PM [facility] PA [facility]  04/20/2022 10:40 AM Larey Dresser, MD [facility] None  05/18/2022 11:15 AM [name], MD [facility]     ACTION: Home visit completed

## 2022-02-14 NOTE — Consult Note (Signed)
Nephrology Consult   Assessment/Recommendations:   AKI on CKD4 vs progressive CKD: Follows with Dr. Hollie Salk, last seen by Dr. Johnney Ou, underlying CKD likely related DM, HTN, repeated AKI's in the context of CRS. At this junction, seems that she has progressive CKD, now ESRD -had a length discussion with patient in regards to dialysis. At this time, she wishes to start HD during this admission which is reasonable especially in the context of recurrent issues with hypervolemia despite escalating PO diuretics. Will consult IR for [facility] placement and anticipate her to start HD once catheter has been placed. NPO after midnight, hold heparin products and aspirin in anticipation of TDC placement. Will need CLIP for outpatient HD placement -will need contact [facility] tomorrow to take a look at her RUE AVF as there is no bruit or thrill on exam -Continue to monitor daily Cr, Dose meds for GFR<15 -Avoid nephrotoxic medications including NSAIDs and iodinated intravenous contrast exposure unless the latter is absolutely indicated.  Preferred narcotic agents for pain control are hydromorphone, fentanyl, and methadone. Morphine should not be used. Avoid Baclofen and avoid oral sodium phosphate and magnesium citrate based laxatives / bowel preps. Continue strict Input and Output monitoring. Will monitor the patient closely with you and intervene or adjust therapy as indicated by changes in clinical status/labs   Acute on chronic diastolic heart failure exacerbation, anasarca -starting HD as above, starting lasix $RemoveBefore'80mg'[id number]$  IV BID until HD has been started, will d/c her metolazone in the interim  Hyperkalemia -renal diet, lokelma until on HD, lasix, HD as above  Hypertension: -Resume home meds  Hypervolemic hyponatremia -diurese as above  Anemia due to chronic disease: -Transfuse for Hgb<7 g/dL -was supposed to be receiving feraheme and aranesp, not sure she actually has done this -will check iron profile to assess for  Fe/ESA needs  CKD MBD, secondary hyperparathyroidism -PTH ordered, monitor phos, c/w phoslo  DM2 -per primary service   [facility] 02/14/2022 7:35 PM   _____________________________________________________________________________________   History of Present Illness: Nancy [name] is a/an 61 y.o. female with a past medical history of CKD, chronic diastolic heart failure, DM2, hypertension, CAD, PVD/PAD, carotid stenosis, history of CVA, tobacco abuse, hyperlipidemia who presents to [facility] with worsening swelling x2 weeks.  She also reports having dyspnea on exertion as well.  She reports that the swelling has progressed to her abdomen and she feels as if her face is now swollen as well.  She reports that she is still making urine.  Denies any fevers, chills, chest pain, dysuria, nausea/vomiting, dysgeusia, loss of appetite, brain fog, new shakes/tremors. Her RUE AVF was placed with Dr. Donzetta Matters on 12/22/2021, course was complicated by AHRF requiring re-intubation briefly, also required diuresis at that time.   Medications:  Current Facility-Administered Medications  Medication Dose Route Frequency Provider Last Rate Last Admin   acetaminophen (TYLENOL) tablet 500 mg  500 mg Oral Q6H PRN Darliss Cheney, MD       Derrill Memo ON 02/15/2022] amLODipine (NORVASC) tablet 10 mg  10 mg Oral Daily Darliss Cheney, MD       Derrill Memo ON 02/15/2022] aspirin EC tablet 81 mg  81 mg Oral Daily Darliss Cheney, MD       Derrill Memo ON 02/15/2022] atorvastatin (LIPITOR) tablet 40 mg  40 mg Oral Daily Darliss Cheney, MD       [START ON 02/15/2022] calcium acetate (PHOSLO) capsule 667 mg  667 mg Oral TID WC Darliss Cheney, MD  carvedilol (COREG) tablet 18.75 mg  18.75 mg Oral BID WC Pahwani, Einar Grad, MD       cephALEXin (KEFLEX) capsule 500 mg  500 mg Oral Q12H Darliss Cheney, MD       Derrill Memo ON 02/15/2022] ferrous sulfate tablet 325 mg  325 mg Oral Q breakfast Pahwani, Einar Grad, MD       furosemide (LASIX)  injection 60 mg  60 mg Intravenous Once Harris, Abigail, PA-C       heparin injection 5,000 Units  5,000 Units Subcutaneous Q8H Darliss Cheney, MD       Derrill Memo ON 02/15/2022] insulin aspart (novoLOG) injection 0-15 Units  0-15 Units Subcutaneous TID WC Pahwani, Ravi, MD       insulin aspart (novoLOG) injection 0-5 Units  0-5 Units Subcutaneous QHS Pahwani, Einar Grad, MD       insulin glargine (LANTUS) Solostar Pen 30 Units  30 Units Subcutaneous QHS Darliss Cheney, MD       [START ON 02/16/2022] metolazone (ZAROXOLYN) tablet 2.5 mg  2.5 mg Oral Once per day on Mon Wed Fri Darliss Cheney, MD       ondansetron [facility]) tablet 4 mg  4 mg Oral Q6H PRN Darliss Cheney, MD       Or   ondansetron (ZOFRAN) injection 4 mg  4 mg Intravenous Q6H PRN Darliss Cheney, MD       risperiDONE (RISPERDAL) tablet 3 mg  3 mg Oral QHS Pahwani, Ravi, MD       sodium zirconium cyclosilicate (LOKELMA) packet 10 g  10 g Oral Once Darliss Cheney, MD       Current Outpatient Medications  Medication Sig Dispense Refill   acetaminophen (TYLENOL) 500 MG tablet Take 500 mg by mouth every 6 (six) hours as needed for moderate pain or headache.     amLODipine (NORVASC) 10 MG tablet Take 1 tablet (10 mg total) by mouth daily. 30 tablet 6   aspirin 81 MG EC tablet Take 1 tablet (81 mg total) by mouth daily. 60 tablet 1   atorvastatin (LIPITOR) 40 MG tablet Take 1 tablet (40 mg total) by mouth daily. 30 tablet 6   calcium acetate (PHOSLO) 667 MG capsule TAKE 1 CAPSULE (667 MG TOTAL) BY MOUTH 3 (THREE) TIMES DAILY WITH MEALS. 90 capsule 3   carvedilol (COREG) 12.5 MG tablet Take 1.5 tablets (18.75 mg total) by mouth 2 (two) times daily with a meal. 90 tablet 6   cephALEXin (KEFLEX) 500 MG capsule Take 1 capsule (500 mg total) by mouth every 12 (twelve) hours. 28 capsule 0   ferrous sulfate 325 (65 FE) MG tablet Take 1 tablet (325 mg total) by mouth daily with breakfast. 100 tablet 1   insulin glargine (LANTUS) 100 UNIT/ML Solostar Pen Inject 30  Units into the skin at bedtime. 15 mL 3   insulin lispro (HUMALOG KWIKPEN) 100 UNIT/ML KwikPen Inject 4 units before breakfast and dinner. (Patient taking differently: Inject 4 Units into the skin in the morning and at bedtime.) 15 mL 3   metolazone (ZAROXOLYN) 2.5 MG tablet Take 1 tablet (2.5 mg total) by mouth 3 (three) times a week. Monday, Wednesday and Friday (Patient taking differently: Take 2.5 mg by mouth See admin instructions. Take one tablet by mouth on Monday, Wednesday and Friday only per patient) 15 tablet 6   potassium chloride SA (KLOR-CON M) 20 MEQ tablet Take 2 tablets (40 mEq total) by mouth 3 (three) times a week. Monday, Wednesday, and Friday (Patient taking differently: Take 40 mEq  by mouth See admin instructions. Take one tablet by mouth on Monday, Wednesday, and Friday only per patient) 30 tablet 6   risperiDONE (RISPERDAL) 3 MG tablet TAKE 1 TABLET (3 MG TOTAL) BY MOUTH AT BEDTIME. (Patient taking differently: Take 3 mg by mouth at bedtime.) 30 tablet 2   torsemide (DEMADEX) 20 MG tablet Take 5 tablets (100 mg total) by mouth 2 (two) times daily. 150 tablet 6   Blood Glucose Monitoring Suppl (ONETOUCH VERIO) w/Device KIT Check blood sugar three times daily. E11.40 1 kit 0   Continuous Blood Gluc Receiver (DEXCOM G6 RECEIVER) DEVI 1 Device by Does not apply route daily. 1 each 0   Continuous Blood Gluc Sensor (DEXCOM G6 SENSOR) MISC 1 packet by Does not apply route daily. 3 each 11   Continuous Blood Gluc Transmit (DEXCOM G6 TRANSMITTER) MISC 1 packet by Does not apply route daily. 1 each 4   glucose blood test strip Use as instructedCheck blood sugar three times daily. E11.40 100 each 12   Insulin Pen Needle 32G X 4 MM MISC Use to inject insulin as directed. 100 each 4     ALLERGIES Hydralazine hcl, Hydrocodone, Metformin and related, Other, Plaquenil [hydroxychloroquine sulfate], Shellfish-derived products, Shrimp (diagnostic), and Sulfa antibiotics  MEDICAL HISTORY Past  Medical History:  Diagnosis Date   Anemia 2006   Carotid artery disease ([place])    right ICA occlusion, 96-22% LICA 01/9797 [place]   CHF (congestive heart failure) ([place])    Chronic kidney disease    Coronary artery disease    Depression 2014   previously on amitryptiline    Diabetes mellitus with neurological manifestation ([place]) 2006   Diabetic peripheral neuropathy ([place]) 04/26/2020   Dyspnea    Fracture of left ankle 1997   Gastroparesis 07/2016   Heart murmur    HOH (hard of hearing) 2004   Hyperlipidemia 2006   Hypertension 2006   IBS (irritable bowel syndrome) 2002   Leukopenia 2015   Macular degeneration 11/2019   Shingles 2009   Stroke [facility]) 2020   Thyroid nodule 2004     SOCIAL HISTORY Social History   Socioeconomic History   Marital status: Legally Separated    Spouse name: [name]   Number of children: 1   Years of education: some colle   Highest education level: Not on file  Occupational History   Occupation: Employed FT as Landscape architect    Comment: [employer] , NA  Tobacco Use   Smoking status: Former    Packs/day: 0.25    Years: 0.50    Pack years: 0.13    Types: Cigarettes   Smokeless tobacco: Never   Tobacco comments:    Former smoker 11/03/21  Vaping Use   Vaping Use: Never used  Substance and Sexual Activity   Alcohol use: No   Drug use: No   Sexual activity: Yes    Birth control/protection: None  Other Topics Concern   Not on file  Social History Narrative   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    Social Determinants of Health   Financial Resource Strain: Low Risk    Difficulty of Paying Living Expenses: Not very hard  Food Insecurity: No Food Insecurity   Worried About Charity fundraiser in the Last Year: Never true   Ran Out of Food in the Last Year: Never true  Transportation Needs: No Transportation Needs   Lack of Transportation (Medical): No   Lack of Transportation (Non-Medical):  No  Physical  Activity: Not on file  Stress: Not on file  Social Connections: Not on file  Intimate Partner Violence: Not on file     FAMILY HISTORY Family History  Problem Relation Age of Onset   Hypertension Mother    Heart disease Mother    Diabetes Mother    Thyroid disease Mother    Congestive Heart Failure Mother    Breast cancer Maternal Grandmother    Colon cancer Maternal Grandfather    Heart attack Sister    Heart disease Brother    Hyperlipidemia Brother    Hypertension Brother    Diabetes Father    Breast cancer Maternal Aunt      Review of Systems: 12 systems reviewed Otherwise as per HPI, all other systems reviewed and negative  Physical Exam: Vitals:   02/14/22 1712 02/14/22 1900  BP: (!) 148/68   Pulse: 72 74  Resp: 18 15  Temp:    SpO2: 100% 100%   No intake/output data recorded. No intake or output data in the 24 hours ending 02/14/22 1935 General: no acute distress HEENT: anicteric sclera, oropharynx clear without lesions, eyelids swollen CV: regular rate, normal rhythm, no murmurs, no gallops, no rubs Lungs: decreased air entry bibasilar but no w/r/r/c, on RA, b/l chest expansion Abd: soft, non-tender, non-distended Skin: no visible lesions or rashes Ext: anasarca, 3+ pitting edema up to lower abdomen Neuro: normal speech, no gross focal deficits  Dialysis access: RUE AVF   Test Results Reviewed Lab Results  Component Value Date   NA 131 (L) 02/14/2022   K 6.0 (H) 02/14/2022   CL 95 (L) 02/14/2022   CO2 26 02/14/2022   BUN 72 (H) 02/14/2022   CREATININE 6.03 (H) 02/14/2022   CALCIUM 8.7 (L) 02/14/2022   ALBUMIN 3.1 (L) 12/23/2021   PHOS 6.1 (H) 12/23/2021     I have reviewed all relevant outside healthcare records related to the patient's kidney injury.

## 2022-02-14 NOTE — ED Provider Triage Note (Signed)
wEmergency Medicine Provider Triage Evaluation Note ? ?Nancy [name] , a 61 y.o. female  was evaluated in triage.  Pt complains of fluid on lungs, legs.  Patient has history of CHF, she states that she saw her cardiologist 1 month ago and had a normal checkup.  Patient states she is compliant on her home Lasix. ? ?Review of Systems  ?Positive: Leg swelling, shortness of breath ?Negative: Nausea, vomit, diarrhea, fevers ? ?Physical Exam  ?BP 140/66   Pulse 66   Temp 98 ?F (36.7 ?C) (Oral)   Resp 14   SpO2 100%  ?Gen:   Awake, no distress   ?Resp:  Normal effort  ?MSK:   Moves extremities without difficulty  ?Other:  2+ pitting edema bilaterally ? ?Medical Decision Making  ?Medically screening exam initiated at 2:55 PM.  Appropriate orders placed.  Nancy [name] was informed that the remainder of the evaluation will be completed by another provider, this initial triage assessment does not replace that evaluation, and the importance of remaining in the ED until their evaluation is complete. ? ? ?  ?Azucena Cecil, PA-C ?02/14/22 1458 ? ?

## 2022-02-15 ENCOUNTER — Encounter (HOSPITAL_COMMUNITY): Payer: Self-pay

## 2022-02-15 ENCOUNTER — Inpatient Hospital Stay (HOSPITAL_COMMUNITY): Payer: Medicaid Other

## 2022-02-15 DIAGNOSIS — T82868A Thrombosis of vascular prosthetic devices, implants and grafts, initial encounter: Secondary | ICD-10-CM

## 2022-02-15 DIAGNOSIS — N185 Chronic kidney disease, stage 5: Secondary | ICD-10-CM

## 2022-02-15 DIAGNOSIS — N049 Nephrotic syndrome with unspecified morphologic changes: Secondary | ICD-10-CM | POA: Diagnosis not present

## 2022-02-15 HISTORY — PX: IR FLUORO GUIDE CV LINE RIGHT: IMG2283

## 2022-02-15 HISTORY — PX: IR US GUIDE VASC ACCESS RIGHT: IMG2390

## 2022-02-15 LAB — COMPREHENSIVE METABOLIC PANEL
ALT: 15 U/L (ref 0–44)
AST: 20 U/L (ref 15–41)
Albumin: 3.2 g/dL — ABNORMAL LOW (ref 3.5–5.0)
Alkaline Phosphatase: 61 U/L (ref 38–126)
Anion gap: 15 (ref 5–15)
BUN: 72 mg/dL — ABNORMAL HIGH (ref 8–23)
CO2: 21 mmol/L — ABNORMAL LOW (ref 22–32)
Calcium: 9 mg/dL (ref 8.9–10.3)
Chloride: 98 mmol/L (ref 98–111)
Creatinine, Ser: 6.17 mg/dL — ABNORMAL HIGH (ref 0.44–1.00)
GFR, Estimated: 7 mL/min — ABNORMAL LOW (ref >60)
Glucose, Bld: 127 mg/dL — ABNORMAL HIGH (ref 70–99)
Potassium: 4.5 mmol/L (ref 3.5–5.1)
Sodium: 134 mmol/L — ABNORMAL LOW (ref 135–145)
Total Bilirubin: 0.3 mg/dL (ref 0.3–1.2)
Total Protein: 7.8 g/dL (ref 6.5–8.1)

## 2022-02-15 LAB — CBC
HCT: 32 % — ABNORMAL LOW (ref 36.0–46.0)
Hemoglobin: 10.6 g/dL — ABNORMAL LOW (ref 12.0–15.0)
MCH: 29 pg (ref 26.0–34.0)
MCHC: 33.1 g/dL (ref 30.0–36.0)
MCV: 87.4 fL (ref 80.0–100.0)
Platelets: 314 10*3/uL (ref 150–400)
RBC: 3.66 MIL/uL — ABNORMAL LOW (ref 3.87–5.11)
RDW: 12.9 % (ref 11.5–15.5)
WBC: 9.2 10*3/uL (ref 4.0–10.5)
nRBC: 0 % (ref 0.0–0.2)

## 2022-02-15 LAB — IRON AND TIBC
Iron: 52 ug/dL (ref 28–170)
Saturation Ratios: 23 % (ref 10.4–31.8)
TIBC: 227 ug/dL — ABNORMAL LOW (ref 250–450)
UIBC: 175 ug/dL

## 2022-02-15 LAB — GLUCOSE, CAPILLARY
Glucose-Capillary: 37 mg/dL — CL (ref 70–99)
Glucose-Capillary: 181 mg/dL — ABNORMAL HIGH (ref 70–99)
Glucose-Capillary: 186 mg/dL — ABNORMAL HIGH (ref 70–99)

## 2022-02-15 LAB — I-STAT CHEM 8, ED
BUN: 67 mg/dL — ABNORMAL HIGH (ref 8–23)
Calcium, Ion: 0.99 mmol/L — ABNORMAL LOW (ref 1.15–1.40)
Chloride: 102 mmol/L (ref 98–111)
Creatinine, Ser: 7.3 mg/dL — ABNORMAL HIGH (ref 0.44–1.00)
Glucose, Bld: 53 mg/dL — ABNORMAL LOW (ref 70–99)
HCT: 29 % — ABNORMAL LOW (ref 36.0–46.0)
Hemoglobin: 9.9 g/dL — ABNORMAL LOW (ref 12.0–15.0)
Potassium: 4.8 mmol/L (ref 3.5–5.1)
Sodium: 134 mmol/L — ABNORMAL LOW (ref 135–145)
TCO2: 25 mmol/L (ref 22–32)

## 2022-02-15 LAB — FERRITIN: Ferritin: 254 ng/mL (ref 11–307)

## 2022-02-15 LAB — CBG MONITORING, ED
Glucose-Capillary: 58 mg/dL — ABNORMAL LOW (ref 70–99)
Glucose-Capillary: 87 mg/dL (ref 70–99)

## 2022-02-15 LAB — PHOSPHORUS: Phosphorus: 6.5 mg/dL — ABNORMAL HIGH (ref 2.5–4.6)

## 2022-02-15 MED ORDER — DEXTROSE 50 % IV SOLN
INTRAVENOUS | Status: AC
  Administered 2022-02-15: 50 mL
  Filled 2022-02-15: qty 50

## 2022-02-15 MED ORDER — MIDAZOLAM HCL 2 MG/2ML IJ SOLN
INTRAMUSCULAR | Status: AC | PRN
  Administered 2022-02-15: 1 mg via INTRAVENOUS

## 2022-02-15 MED ORDER — INSULIN GLARGINE-YFGN 100 UNIT/ML ~~LOC~~ SOLN
15.0000 [IU] | Freq: Every day | SUBCUTANEOUS | Status: DC
  Administered 2022-02-15 – 2022-02-20 (×6): 15 [IU] via SUBCUTANEOUS
  Filled 2022-02-15 (×7): qty 0.15

## 2022-02-15 MED ORDER — FENTANYL CITRATE (PF) 100 MCG/2ML IJ SOLN
INTRAMUSCULAR | Status: AC
Start: 2022-02-15 — End: 2022-02-16
  Filled 2022-02-15: qty 2

## 2022-02-15 MED ORDER — MIDAZOLAM HCL 2 MG/2ML IJ SOLN
INTRAMUSCULAR | Status: AC
  Filled 2022-02-15: qty 2

## 2022-02-15 MED ORDER — CEFAZOLIN SODIUM-DEXTROSE 2-4 GM/100ML-% IV SOLN
INTRAVENOUS | Status: AC
  Filled 2022-02-15: qty 100

## 2022-02-15 MED ORDER — CLONIDINE HCL 0.2 MG PO TABS: 0.2000 mg | ORAL_TABLET | Freq: Four times a day (QID) | ORAL | Status: DC | PRN

## 2022-02-15 MED ORDER — FENTANYL CITRATE (PF) 100 MCG/2ML IJ SOLN
INTRAMUSCULAR | Status: AC | PRN
  Administered 2022-02-15: 50 ug via INTRAVENOUS

## 2022-02-15 MED ORDER — LIDOCAINE HCL 1 % IJ SOLN
INTRAMUSCULAR | Status: AC
  Administered 2022-02-15: 10 mL via SUBCUTANEOUS
  Filled 2022-02-15: qty 20

## 2022-02-15 MED ORDER — HEPARIN SODIUM (PORCINE) 1000 UNIT/ML IJ SOLN
INTRAMUSCULAR | Status: AC
  Filled 2022-02-15: qty 10

## 2022-02-15 MED ORDER — CEFAZOLIN SODIUM-DEXTROSE 2-4 GM/100ML-% IV SOLN
2.0000 g | INTRAVENOUS | Status: AC
Start: 2022-02-15 — End: 2022-02-15
  Administered 2022-02-15: 2 g via INTRAVENOUS

## 2022-02-15 MED ORDER — DEXTROSE 50 % IV SOLN
1.0000 | Freq: Once | INTRAVENOUS | Status: AC
  Administered 2022-02-15: 50 mL via INTRAVENOUS
  Filled 2022-02-15: qty 50

## 2022-02-15 MED ORDER — CEFAZOLIN SODIUM-DEXTROSE 1-4 GM/50ML-% IV SOLN: 1.0000 g | INTRAVENOUS | Status: DC

## 2022-02-15 MED ORDER — SODIUM CHLORIDE 0.9 % IV SOLN
125.0000 mg | INTRAVENOUS | Status: DC
  Administered 2022-02-15 – 2022-02-20 (×3): 125 mg via INTRAVENOUS
  Filled 2022-02-15 (×3): qty 10

## 2022-02-15 NOTE — H&P (View-Only) (Signed)
?Postoperative hemodialysis access ? ? ? ? ?Date of Surgery:  12/22/2021 ?Surgeon: Donzetta Matters ? ?HPI:  61 year old female who recently underwent right brachiocephalic AVF on 0/05/2375 by Dr. Donzetta Matters.  She has not followed up with [place] post operatively.   ? ?She presents to the hospital with worsening BLE swelling for a couple of weeks. She has had some DOE but no SOB with rest.  In the ER, she was found to by hyperkalemic with K+ of 6.  She is currently in Interventional Radiology for [facility] placement.  ? ?She is right hand dominant.  She has hx of stroke.   ? ?She is back in the ER room after [facility] insertion.  She is wearing a glove on the right hand.  She states that her hand stays cold even before the fistula creation.  She denies any pain in her right hand.   ? ?PHYSICAL EXAMINATION: ? ?Vitals:  ? 02/15/22 0900 02/15/22 1000  ?BP: (!) 134/52 (!) 146/63  ?Pulse: 63 63  ?Resp: 13 19  ?Temp:    ?SpO2: 91% 100%  ? ? ?Incision is healed ?Sensation in digits is intact ?There is not  Thrill  ?There is not bruit. ?The fistula is not palpable  ?The radial pulse is palpable ? ? ?BUE vein mapping 11/29/2021: ?+-----------------+-------------+----------+---------------+  ?Right Cephalic   Diameter (cm)Depth (cm)   Findings      ?+-----------------+-------------+----------+---------------+  ?Shoulder             0.26                                ?+-----------------+-------------+----------+---------------+  ?Prox upper arm       0.27                                ?+-----------------+-------------+----------+---------------+  ?Mid upper arm        0.28               mildly tortuous  ?+-----------------+-------------+----------+---------------+  ?Dist upper arm       0.25                                ?+-----------------+-------------+----------+---------------+  ?Antecubital fossa    0.24                                ?+-----------------+-------------+----------+---------------+  ?Prox forearm         0.19                                 ?+-----------------+-------------+----------+---------------+  ?Mid forearm          0.17                                ?+-----------------+-------------+----------+---------------+  ?Dist forearm         0.23                                ?+-----------------+-------------+----------+---------------+  ? ?+-----------------+-------------+----------+--------+  ?Right Basilic    Diameter (cm)Depth (cm)Findings  ?+-----------------+-------------+----------+--------+  ?Prox upper arm  0.32                         ?+-----------------+-------------+----------+--------+  ?Mid upper arm        0.33                         ?+-----------------+-------------+----------+--------+  ?Dist upper arm       0.31                         ?+-----------------+-------------+----------+--------+  ?Antecubital fossa    0.31                         ?+-----------------+-------------+----------+--------+  ? ?+-----------------+-------------+----------+---------------+  ?Left Cephalic    Diameter (cm)Depth (cm)   Findings      ?+-----------------+-------------+----------+---------------+  ?Shoulder             0.14               thickened walls  ?+-----------------+-------------+----------+---------------+  ?Prox upper arm       0.09               thickened walls  ?+-----------------+-------------+----------+---------------+  ?Mid upper arm        0.13               thickened walls  ?+-----------------+-------------+----------+---------------+  ?Dist upper arm       0.15               thickened walls  ?+-----------------+-------------+----------+---------------+  ?Antecubital fossa    0.19                                ?+-----------------+-------------+----------+---------------+  ?Prox forearm                            no compression   ?+-----------------+-------------+----------+---------------+  ?Mid forearm                              not visualized   ?+-----------------+-------------+----------+---------------+  ?Dist forearm                            not visualized   ?+-----------------+-------------+----------+---------------+  ? ?+-----------------+-------------+----------+--------+  ?Left Basilic     Diameter (cm)Depth (cm)Findings  ?+-----------------+-------------+----------+--------+  ?Prox upper arm       0.25                         ?+-----------------+-------------+----------+--------+  ?Mid upper arm        0.23                         ?+-----------------+-------------+----------+--------+  ?Dist upper arm       0.21                         ?+-----------------+-------------+----------+--------+  ?Antecubital fossa    0.22                         ?+-----------------+-------------+----------+--------+  ? ?ASSESSMENT/PLAN: ? ?BIRTHA HATLER is a 61 y.o. 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 ? ? ?-  pt had TDC placed by IR today.  She will need new HD access.  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  We will schedule her for new right arm access and timing to be determined.   She had sedation for [facility] placement and we will need to talk with her again tomorrow.  ?-pt will be seen by Dr. Stanford Breed later today. ? ? ? ?Leontine Locket, PA-C ?[facility] ?[phone] ? ?VASCULAR STAFF ADDENDUM: ?I have independently interviewed and examined the patient. ?I agree with the above.  ?RUE BC AVF failed to mature. ?Patient in ESRD, needs HD. ?[place] placed today by IR. ?Marginal basilic veins bilaterally. ?Right handed. Prefers left sided access if possible. 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 ?Plan OR tomorrow for new access creation if safe from medicine / anesthesia standpoint. Otherwise, we can dialyze through the weekend and create access Monday.  ? ?Yevonne Aline. Stanford Breed, MD ?[facility] [place] ?Office Phone  Number: [phone] ?02/15/2022 4:07 PM ? ? ?

## 2022-02-15 NOTE — Consult Note (Addendum)
?Postoperative hemodialysis access ? ? ? ? ?Date of Surgery:  12/22/2021 ?Surgeon: Donzetta Matters ? ?HPI:  61 year old female who recently underwent right brachiocephalic AVF on 12/18/9415 by Dr. Donzetta Matters.  She has not followed up with [place] post operatively.   ? ?She presents to the hospital with worsening BLE swelling for a couple of weeks. She has had some DOE but no SOB with rest.  In the ER, she was found to by hyperkalemic with K+ of 6.  She is currently in Interventional Radiology for [facility] placement.  ? ?She is right hand dominant.  She has hx of stroke.   ? ?She is back in the ER room after [facility] insertion.  She is wearing a glove on the right hand.  She states that her hand stays cold even before the fistula creation.  She denies any pain in her right hand.   ? ?PHYSICAL EXAMINATION: ? ?Vitals:  ? 02/15/22 0900 02/15/22 1000  ?BP: (!) 134/52 (!) 146/63  ?Pulse: 63 63  ?Resp: 13 19  ?Temp:    ?SpO2: 91% 100%  ? ? ?Incision is healed ?Sensation in digits is intact ?There is not  Thrill  ?There is not bruit. ?The fistula is not palpable  ?The radial pulse is palpable ? ? ?BUE vein mapping 11/29/2021: ?+-----------------+-------------+----------+---------------+  ?Right Cephalic   Diameter (cm)Depth (cm)   Findings      ?+-----------------+-------------+----------+---------------+  ?Shoulder             0.26                                ?+-----------------+-------------+----------+---------------+  ?Prox upper arm       0.27                                ?+-----------------+-------------+----------+---------------+  ?Mid upper arm        0.28               mildly tortuous  ?+-----------------+-------------+----------+---------------+  ?Dist upper arm       0.25                                ?+-----------------+-------------+----------+---------------+  ?Antecubital fossa    0.24                                ?+-----------------+-------------+----------+---------------+  ?Prox forearm         0.19                                 ?+-----------------+-------------+----------+---------------+  ?Mid forearm          0.17                                ?+-----------------+-------------+----------+---------------+  ?Dist forearm         0.23                                ?+-----------------+-------------+----------+---------------+  ? ?+-----------------+-------------+----------+--------+  ?Right Basilic    Diameter (cm)Depth (cm)Findings  ?+-----------------+-------------+----------+--------+  ?Prox upper arm  0.32                         ?+-----------------+-------------+----------+--------+  ?Mid upper arm        0.33                         ?+-----------------+-------------+----------+--------+  ?Dist upper arm       0.31                         ?+-----------------+-------------+----------+--------+  ?Antecubital fossa    0.31                         ?+-----------------+-------------+----------+--------+  ? ?+-----------------+-------------+----------+---------------+  ?Left Cephalic    Diameter (cm)Depth (cm)   Findings      ?+-----------------+-------------+----------+---------------+  ?Shoulder             0.14               thickened walls  ?+-----------------+-------------+----------+---------------+  ?Prox upper arm       0.09               thickened walls  ?+-----------------+-------------+----------+---------------+  ?Mid upper arm        0.13               thickened walls  ?+-----------------+-------------+----------+---------------+  ?Dist upper arm       0.15               thickened walls  ?+-----------------+-------------+----------+---------------+  ?Antecubital fossa    0.19                                ?+-----------------+-------------+----------+---------------+  ?Prox forearm                            no compression   ?+-----------------+-------------+----------+---------------+  ?Mid forearm                              not visualized   ?+-----------------+-------------+----------+---------------+  ?Dist forearm                            not visualized   ?+-----------------+-------------+----------+---------------+  ? ?+-----------------+-------------+----------+--------+  ?Left Basilic     Diameter (cm)Depth (cm)Findings  ?+-----------------+-------------+----------+--------+  ?Prox upper arm       0.25                         ?+-----------------+-------------+----------+--------+  ?Mid upper arm        0.23                         ?+-----------------+-------------+----------+--------+  ?Dist upper arm       0.21                         ?+-----------------+-------------+----------+--------+  ?Antecubital fossa    0.22                         ?+-----------------+-------------+----------+--------+  ? ?ASSESSMENT/PLAN: ? ?Nancy [name] is a 61 y.o. 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 ? ? ?-  pt had TDC placed by IR today.  She will need new HD access.  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  We will schedule her for new right arm access and timing to be determined.   She had sedation for [facility] placement and we will need to talk with her again tomorrow.  ?-pt will be seen by Dr. Stanford Breed later today. ? ? ? ?Leontine Locket, PA-C ?[facility] ?[phone] ? ?VASCULAR STAFF ADDENDUM: ?I have independently interviewed and examined the patient. ?I agree with the above.  ?RUE BC AVF failed to mature. ?Patient in ESRD, needs HD. ?[place] placed today by IR. ?Marginal basilic veins bilaterally. ?Right handed. Prefers left sided access if possible. 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 ?Plan OR tomorrow for new access creation if safe from medicine / anesthesia standpoint. Otherwise, we can dialyze through the weekend and create access Monday.  ? ?Yevonne Aline. Stanford Breed, MD ?[facility] [place] ?Office Phone  Number: [phone] ?02/15/2022 4:07 PM ? ? ?

## 2022-02-15 NOTE — Progress Notes (Signed)
PROGRESS NOTE    JYLL TOMARO  HEN:[id number] DOB: [birth year] DOA: 02/14/2022 PCP: [name], MD   Brief Narrative:  Nancy [name] is a 61 y.o. female with medical history significant of CKD stage V, hypertension, type 2 diabetes mellitus, CAD, chronic anemia, depression and many other comorbidities as listed below presented to ED with a complaint of bilateral leg swelling.  Per patient, she has been taking her diuretics as prescribed and she has been making urine as well however she has noticed gradually worsening bilateral lower extremity swelling for past couple of weeks.  She has also noticed to have some exertional shortness of breath but no shortness of breath at rest.  No other complaint such as fever, chills, sweating or any other complaint.  Reportedly patient had graft placement in right arm on 12/22/2021 however that is not mature yet.   Upon arrival to ED, she was found to have severe bilateral lower extremity edema, mild hyperkalemia with potassium of 6, creatinine fairly stable at her baseline.  No other lab abnormalities.  COVID-19 pending.  EDP has discussed with nephrology who is going to see this patient later today however patient is likely going to require dialysis.  EDP has been advised by nephrology to provide patient with IV Lasix.  Assessment & Plan:   Principal Problem:   Renal anasarca Active Problems:   Acute on chronic diastolic CHF (congestive heart failure) (HCC)   ESRD (end stage renal disease) (HCC)   Hyperkalemia  Renal anasarca/progressively worsening CKD stage V, now ESRD: Started on diuretics.  Not enough diuresis so far.  Seen by nephrology.  Plan for tunneled dialysis catheter by IR at some point in time today and then dialysis.  Appreciate both IR and nephrology help.   Acute on chronic diastolic congestive heart failure: Has history of grade 2 diastolic dysfunction in the past.  Echo done only 5 months ago.  Chest x-ray clear but  bilateral lower extremity edema.  Continue Lasix 80 mg IV twice daily.  Hopefully dialysis will help.   Hyperkalemia: Received Lokelma 10 g yesterday, BMP this morning pending.   Type 2 diabetes mellitus: She appears to be taking Lantus 30 units at night which was resumed but this morning she was hypoglycemic, likely due to being n.p.o.  Given dextrose this morning.  Will reduce Lantus to 15 units and continue SSI.   Essential hypertension: Controlled.  Continue home medications.   Morbid obesity: Weight loss counseling provided.   HLD: Continue atorvastatin.   History of CAD: Asymptomatic.  Continue aspirin and Coreg.  DVT prophylaxis: heparin injection 5,000 Units Start: 02/14/22 2200   Code Status: Full Code  Family Communication:  None present at bedside.  Plan of care discussed with patient in length and he/she verbalized understanding and agreed with it.  Status is: Inpatient Remains inpatient appropriate because: Needs TDC in dialysis           Estimated body mass index is 38.09 kg/m as calculated from the following:   Height as of 12/22/21: 5\' 3"  (1.6 m).   Weight as of an earlier encounter on 02/14/22: 97.5 kg.    Nutritional Assessment: There is no height or weight on file to calculate BMI.. Seen by dietician.  I agree with the assessment and plan as outlined below: Nutrition Status:        . Skin Assessment: I have examined the patient's skin and I agree with the wound assessment as performed by the wound care  RN as outlined below:    Consultants:  Nephrology IR  Procedures:  None  Antimicrobials:  Anti-infectives (From admission, onward)    Start     Dose/Rate Route Frequency Ordered Stop   02/15/22 0800  ceFAZolin (ANCEF) IVPB 1 g/50 mL premix        1 g 100 mL/hr over 30 Minutes Intravenous To Radiology 02/15/22 0746 02/16/22 0800   02/14/22 2200  cephALEXin (KEFLEX) capsule 500 mg        500 mg Oral Every 12 hours 02/14/22 1747            Subjective: Seen and examined in the ED.  She was complaining of weakness because of low blood sugar.  She was concerned about that.  Otherwise she had no shortness of breath or other complaint.  Objective: Vitals:   02/15/22 0100 02/15/22 0200 02/15/22 0300 02/15/22 0700  BP: (!) 171/84 (!) 152/75 (!) 155/77 (!) 145/60  Pulse: 73 69 70 65  Resp: 15 14 13 15   Temp:      TempSrc:      SpO2: 99% 99% 98% 94%    Intake/Output Summary (Last 24 hours) at 02/15/2022 1014 Last data filed at 02/15/2022 6468 Gross per 24 hour  Intake --  Output 1000 ml  Net -1000 ml   There were no vitals filed for this visit.  Examination:  General exam: Appears calm and comfortable, obese Respiratory system: Clear to auscultation. Respiratory effort normal. Cardiovascular system: S1 & S2 heard, RRR. No JVD, murmurs, rubs, gallops or clicks.  +3 pitting edema bilateral lower extremity Gastrointestinal system: Abdomen is nondistended, soft and nontender. No organomegaly or masses felt. Normal bowel sounds heard. Central nervous system: Alert and oriented. No focal neurological deficits. Extremities: Symmetric 5 x 5 power. Skin: No rashes, lesions or ulcers Psychiatry: Judgement and insight appear normal. Mood & affect appropriate.    Data Reviewed: I have personally reviewed following labs and imaging studies  CBC: Recent Labs  Lab 02/14/22 1500 02/15/22 0838 02/15/22 0846  WBC 9.6  --  7.8  NEUTROABS 6.6  --   --   HGB 8.8* 9.9* 13.4  HCT 27.0* 29.0* 39.2  MCV 88.5  --  93.6  PLT 320  --  032   Basic Metabolic Panel: Recent Labs  Lab 02/14/22 1500 02/14/22 1639 02/15/22 0838  NA 131*  --  134*  K 6.0*  --  4.8  CL 95*  --  102  CO2 26  --   --   GLUCOSE 97  --  53*  BUN 72*  --  67*  CREATININE 6.03* 6.20* 7.30*  CALCIUM 8.7*  --   --   MG  --  2.7*  --    GFR: Estimated Creatinine Clearance: 9 mL/min (A) (by C-G formula based on SCr of 7.3 mg/dL (H)). Liver Function  Tests: Recent Labs  Lab 02/14/22 1639  AST 13*  ALT 9  ALKPHOS 46  BILITOT 0.6  PROT 7.7  ALBUMIN 3.2*   No results for input(s): LIPASE, AMYLASE in the last 168 hours. No results for input(s): AMMONIA in the last 168 hours. Coagulation Profile: No results for input(s): INR, PROTIME in the last 168 hours. Cardiac Enzymes: No results for input(s): CKTOTAL, CKMB, CKMBINDEX, TROPONINI in the last 168 hours. BNP (last 3 results) No results for input(s): PROBNP in the last 8760 hours. HbA1C: No results for input(s): HGBA1C in the last 72 hours. CBG: Recent Labs  Lab 02/14/22 2111  02/15/22 0823  GLUCAP 108* 58*   Lipid Profile: No results for input(s): CHOL, HDL, LDLCALC, TRIG, CHOLHDL, LDLDIRECT in the last 72 hours. Thyroid Function Tests: No results for input(s): TSH, T4TOTAL, FREET4, T3FREE, THYROIDAB in the last 72 hours. Anemia Panel: Recent Labs    02/15/22 0846  FERRITIN 137  TIBC 330  IRON 52   Sepsis Labs: No results for input(s): PROCALCITON, LATICACIDVEN in the last 168 hours.  Recent Results (from the past 240 hour(s))  Aerobic Culture w Gram Stain (superficial specimen)     Status: None   Collection Time: 02/05/22 11:15 AM   Specimen: Wound  Result Value Ref Range Status   Specimen Description WOUND  Final   Special Requests BONE LEFT CANCANEUS  Final   Gram Stain   Final    RARE WBC PRESENT, PREDOMINANTLY MONONUCLEAR RARE GRAM NEGATIVE RODS Performed at [facility], [address] [place], [place] [address]    Culture MODERATE CITROBACTER KOSERI  Final   Report Status 02/08/2022 FINAL  Final   Organism ID, Bacteria CITROBACTER KOSERI  Final      Susceptibility   Citrobacter koseri - MIC*    CEFAZOLIN <=4 SENSITIVE Sensitive     CEFEPIME <=0.12 SENSITIVE Sensitive     CEFTAZIDIME <=1 SENSITIVE Sensitive     CEFTRIAXONE <=0.25 SENSITIVE Sensitive     CIPROFLOXACIN <=0.25 SENSITIVE Sensitive     GENTAMICIN <=1 SENSITIVE Sensitive      IMIPENEM <=0.25 SENSITIVE Sensitive     TRIMETH/SULFA <=20 SENSITIVE Sensitive     PIP/TAZO 16 SENSITIVE Sensitive     * MODERATE CITROBACTER KOSERI  Anaerobic culture w Gram Stain     Status: None   Collection Time: 02/05/22 11:15 AM   Specimen: Wound  Result Value Ref Range Status   Specimen Description WOUND  Final   Special Requests BONE LEFT CANCANEUS  Final   Culture   Final    NO ANAEROBES ISOLATED Performed at [facility], [place] [address], [place], [place] [address]    Report Status 02/10/2022 FINAL  Final  Resp Panel by RT-PCR (Flu A&B, Covid) Nasopharyngeal Swab     Status: None   Collection Time: 02/14/22  4:40 PM   Specimen: Nasopharyngeal Swab; Nasopharyngeal(NP) swabs in vial transport medium  Result Value Ref Range Status   SARS Coronavirus 2 by RT PCR NEGATIVE NEGATIVE Final    Comment: (NOTE) SARS-CoV-2 target nucleic acids are NOT DETECTED.  The SARS-CoV-2 RNA is generally detectable in upper respiratory specimens during the acute phase of infection. The lowest concentration of SARS-CoV-2 viral copies this assay can detect is 138 copies/mL. A negative result does not preclude SARS-Cov-2 infection and should not be used as the sole basis for treatment or other patient management decisions. A negative result may occur with  improper specimen collection/handling, submission of specimen other than nasopharyngeal swab, presence of viral mutation(s) within the areas targeted by this assay, and inadequate number of viral copies(<138 copies/mL). A negative result must be combined with clinical observations, patient history, and epidemiological information. The expected result is Negative.  Fact Sheet for Patients:  EntrepreneurPulse.com.au  Fact Sheet for Healthcare Providers:  IncredibleEmployment.be  This test is no t yet approved or cleared by the [place] FDA and  has been authorized for detection and/or diagnosis  of SARS-CoV-2 by FDA under an Emergency Use Authorization (EUA). This EUA will remain  in effect (meaning this test can be used) for the duration of the  COVID-19 declaration under Section 564(b)(1) of the Act, 21 U.S.C.section 360bbb-3(b)(1), unless the authorization is terminated  or revoked sooner.       Influenza A by PCR NEGATIVE NEGATIVE Final   Influenza B by PCR NEGATIVE NEGATIVE Final    Comment: (NOTE) The Xpert Xpress SARS-CoV-2/FLU/RSV plus assay is intended as an aid in the diagnosis of influenza from Nasopharyngeal swab specimens and should not be used as a sole basis for treatment. Nasal washings and aspirates are unacceptable for Xpert Xpress SARS-CoV-2/FLU/RSV testing.  Fact Sheet for Patients: EntrepreneurPulse.com.au  Fact Sheet for Healthcare Providers: IncredibleEmployment.be  This test is not yet approved or cleared by the [place] FDA and has been authorized for detection and/or diagnosis of SARS-CoV-2 by FDA under an Emergency Use Authorization (EUA). This EUA will remain in effect (meaning this test can be used) for the duration of the COVID-19 declaration under Section 564(b)(1) of the Act, 21 U.S.C. section 360bbb-3(b)(1), unless the authorization is terminated or revoked.  Performed at [facility], [place] [address], [place], [place] [address]      Radiology Studies: DG Chest 2 View  Result Date: 02/14/2022 CLINICAL DATA:  Shortness of breath.  Leg swelling. EXAM: CHEST - 2 VIEW COMPARISON:  AP chest 12/22/2021 FINDINGS: Interval extubation. Cardiac silhouette and mediastinal contours are within normal limits. The lungs are clear with resolution of the prior bilateral heterogeneous opacities. No pleural effusion or pneumothorax. No acute skeletal abnormality. Likely cholecystectomy clips on lateral view. IMPRESSION: No active cardiopulmonary disease. Electronically Signed   By: Yvonne Kendall M.D.   On:  02/14/2022 15:31    Scheduled Meds:  amLODipine  10 mg Oral Daily   aspirin EC  81 mg Oral Daily   atorvastatin  40 mg Oral Daily   calcium acetate  667 mg Oral TID WC   carvedilol  18.75 mg Oral BID WC   cephALEXin  500 mg Oral Q12H   Chlorhexidine Gluconate Cloth  6 each Topical Q0600   ferrous sulfate  325 mg Oral Q breakfast   furosemide  60 mg Intravenous Once   furosemide  80 mg Intravenous Q12H   heparin  5,000 Units Subcutaneous Q8H   insulin aspart  0-15 Units Subcutaneous TID WC   insulin aspart  0-5 Units Subcutaneous QHS   insulin glargine-yfgn  30 Units Subcutaneous QHS   risperiDONE  3 mg Oral QHS   sodium zirconium cyclosilicate  10 g Oral BID   Continuous Infusions:  sodium chloride     sodium chloride      ceFAZolin (ANCEF) IV       LOS: 1 day   Time spent: 30 minutes  Darliss Cheney, MD [facility]  02/15/2022, 10:14 AM  Please page via [facility] and do not message via secure chat for urgent patient care matters. Secure chat can be used for non urgent patient care matters.  How to contact the [facility] Attending or Consulting provider [place] or covering provider during after hours [place], for this patient?  Check the care team in [facility] and look for a) attending/consulting [facility] provider listed and b) the [facility] team listed. Page or secure chat [phone]. Log into [email or web address] and use [facility] universal password to access. If you do not have the password, please contact the hospital operator. Locate the [facility] provider you are looking for under [facility] and page to a number that you can be directly reached. If you still have difficulty reaching the provider,  please page the [facility] (Director on Call) for the Hospitalists listed on amion for assistance.

## 2022-02-15 NOTE — ED Notes (Signed)
Pt noted blood pressure noted to be trending upwards, last reading 171/84, night coverage provider messaged and awaiting new orders for the same.  ?

## 2022-02-15 NOTE — Consult Note (Signed)
° °Chief Complaint: °Patient was seen in consultation today for  °Chief Complaint  °Patient presents with  ° Leg Swelling  ° at the request of * No referring provider recorded for this case * ° °Supervising Physician: Mir, Farhaan ° °Patient Status: [facility] - In-pt ° °History of Present Illness: °Nancy [name] is a 60 y.o. female with medical history significant of CKD stage V, hypertension, type 2 diabetes mellitus, CAD, chronic anemia, depression and many other comorbidities as listed below presented to ED with a complaint of bilateral leg swelling.  She has been taking her diuretics as prescribed and she has been making urine as well however she has noticed gradually worsening bilateral lower extremity swelling for past couple of weeks.  She has also noticed to have some exertional shortness of breath.  No noted fever, chills, or sweating. Reportedly patient had graft placement in right arm on 12/22/2021 however that is not mature yet. °Patient reports back pain this morning and is uncomfortable due to hunger. ° ° °Past Medical History:  °Diagnosis Date  ° Anemia 2006  ° Carotid artery disease (HCC)   ° right ICA occlusion, 40-59% LICA 02/2021 US  ° CHF (congestive heart failure) (HCC)   ° Chronic kidney disease   ° Coronary artery disease   ° Depression 2014  ° previously on amitryptiline   ° Diabetes mellitus with neurological manifestation (HCC) 2006  ° Diabetic peripheral neuropathy (HCC) 04/26/2020  ° Dyspnea   ° Fracture of left ankle 1997  ° Gastroparesis 07/2016  ° Heart murmur   ° HOH (hard of hearing) 2004  ° Hyperlipidemia 2006  ° Hypertension 2006  ° IBS (irritable bowel syndrome) 2002  ° Leukopenia 2015  ° Macular degeneration 11/2019  ° Shingles 2009  ° Stroke (HCC) 2020  ° Thyroid nodule 2004  ° ° °Past Surgical History:  °Procedure Laterality Date  ° ABDOMINAL HYSTERECTOMY  2005  ° AV FISTULA PLACEMENT Right 12/22/2021  ° Procedure: RIGHT ARM ARTERIOVENOUS (AV) BRACIOCPHELAIC FISTULA CREATION;   Surgeon: Cain, Brandon Christopher, MD;  Location: [facility];  Service: Vascular;  Laterality: Right;  ° CATARACT EXTRACTION Left 11/2019  ° CESAREAN SECTION  1983   ° CHOLECYSTECTOMY N/A 03/05/2020  ° Procedure: LAPAROSCOPIC CHOLECYSTECTOMY WITH INTRAOPERATIVE CHOLANGIOGRAM;  Surgeon: Tsuei, Matthew, MD;  Location: [facility];  Service: General;  Laterality: N/A;  ° ESOPHAGOGASTRODUODENOSCOPY (EGD) WITH PROPOFOL Left 08/26/2014  ° Procedure: ESOPHAGOGASTRODUODENOSCOPY (EGD) WITH PROPOFOL;  Surgeon: William Outlaw, MD;  Location: [facility];  Service: Endoscopy;  Laterality: Left;  ° ESOPHAGOGASTRODUODENOSCOPY (EGD) WITH PROPOFOL N/A 03/03/2020  ° Procedure: ESOPHAGOGASTRODUODENOSCOPY (EGD) WITH PROPOFOL;  Surgeon: Pyrtle, Jay M, MD;  Location: [facility];  Service: Gastroenterology;  Laterality: N/A;  ° RIGHT HEART CATH N/A 07/14/2021  ° Procedure: RIGHT HEART CATH;  Surgeon: McLean, Dalton S, MD;  Location: [facility];  Service: Cardiovascular;  Laterality: N/A;  ° RIGHT/LEFT HEART CATH AND CORONARY ANGIOGRAPHY N/A 04/27/2021  ° Procedure: RIGHT/LEFT HEART CATH AND CORONARY ANGIOGRAPHY;  Surgeon: Berry, Jonathan J, MD;  Location: [facility];  Service: Cardiovascular;  Laterality: N/A;  ° ° °Allergies: °Hydralazine hcl, Hydrocodone, Metformin and related, Other, Plaquenil [hydroxychloroquine sulfate], Shellfish-derived products, Shrimp (diagnostic), and Sulfa antibiotics ° °Medications: °Prior to Admission medications   °Medication Sig Start Date End Date Taking? Authorizing Provider  °acetaminophen (TYLENOL) 500 MG tablet Take 500 mg by mouth every 6 (six) hours as needed for moderate pain or headache.   Yes [provider]  °amLODipine (  NORVASC) 10 MG tablet Take 1 tablet (10 mg total) by mouth daily. 11/07/21  Yes McLean, Dalton S, MD  °aspirin 81 MG EC tablet Take 1 tablet (81 mg total) by mouth daily. 09/26/21  Yes Milford, Jessica M, FNP  °atorvastatin (LIPITOR) 40 MG tablet Take 1 tablet (40  mg total) by mouth daily. 11/07/21  Yes McLean, Dalton S, MD  °calcium acetate (PHOSLO) 667 MG capsule TAKE 1 CAPSULE (667 MG TOTAL) BY MOUTH 3 (THREE) TIMES DAILY WITH MEALS. 02/08/22  Yes Johnson, Deborah B, MD  °carvedilol (COREG) 12.5 MG tablet Take 1.5 tablets (18.75 mg total) by mouth 2 (two) times daily with a meal. 11/07/21  Yes McLean, Dalton S, MD  °cephALEXin (KEFLEX) 500 MG capsule Take 1 capsule (500 mg total) by mouth every 12 (twelve) hours. 02/08/22  Yes Robson, Michael G, MD  °ferrous sulfate 325 (65 FE) MG tablet Take 1 tablet (325 mg total) by mouth daily with breakfast. 11/07/21  Yes Johnson, Deborah B, MD  °insulin glargine (LANTUS) 100 UNIT/ML Solostar Pen Inject 30 Units into the skin at bedtime. 11/07/21  Yes Johnson, Deborah B, MD  °insulin lispro (HUMALOG KWIKPEN) 100 UNIT/ML KwikPen Inject 4 units before breakfast and dinner. °Patient taking differently: Inject 4 Units into the skin in the morning and at bedtime. 11/07/21  Yes Johnson, Deborah B, MD  °metolazone (ZAROXOLYN) 2.5 MG tablet Take 1 tablet (2.5 mg total) by mouth 3 (three) times a week. Monday, Wednesday and Friday °Patient taking differently: Take 2.5 mg by mouth See admin instructions. Take one tablet by mouth on Monday, Wednesday and Friday only per patient 01/26/22  Yes Milford, Jessica M, FNP  °potassium chloride SA (KLOR-CON M) 20 MEQ tablet Take 2 tablets (40 mEq total) by mouth 3 (three) times a week. Monday, Wednesday, and Friday °Patient taking differently: Take 40 mEq by mouth See admin instructions. Take one tablet by mouth on Monday, Wednesday, and Friday only per patient 01/26/22  Yes Milford, Jessica M, FNP  °risperiDONE (RISPERDAL) 3 MG tablet TAKE 1 TABLET (3 MG TOTAL) BY MOUTH AT BEDTIME. °Patient taking differently: Take 3 mg by mouth at bedtime. 01/15/22 01/15/23 Yes Johnson, Deborah B, MD  °torsemide (DEMADEX) 20 MG tablet Take 5 tablets (100 mg total) by mouth 2 (two) times daily. 01/05/22  Yes McLean, Dalton S,  MD  °Blood Glucose Monitoring Suppl (ONETOUCH VERIO) w/Device KIT Check blood sugar three times daily. E11.40 05/06/20   Johnson, Deborah B, MD  °Continuous Blood Gluc Receiver (DEXCOM G6 RECEIVER) DEVI 1 Device by Does not apply route daily. 11/15/21   Johnson, Deborah B, MD  °Continuous Blood Gluc Sensor (DEXCOM G6 SENSOR) MISC 1 packet by Does not apply route daily. 11/15/21   Johnson, Deborah B, MD  °Continuous Blood Gluc Transmit (DEXCOM G6 TRANSMITTER) MISC 1 packet by Does not apply route daily. 11/15/21   Johnson, Deborah B, MD  °glucose blood test strip Use as instructedCheck blood sugar three times daily. E11.40 07/11/20   Johnson, Deborah B, MD  °Insulin Pen Needle 32G X 4 MM MISC Use to inject insulin as directed. 11/07/21   Johnson, Deborah B, MD  °Insulin NPH Isophane & Regular (RELION 70/30 [place]) Inject 35 Units into the skin 2 (two) times daily.  08/17/14  [provider]  °  ° °Family History  °Problem Relation Age of Onset  ° Hypertension Mother   ° Heart disease Mother   ° Diabetes Mother   ° Thyroid disease Mother   °   Congestive Heart Failure Mother   ° Breast cancer Maternal Grandmother   ° Colon cancer Maternal Grandfather   ° Heart attack Sister   ° Heart disease Brother   ° Hyperlipidemia Brother   ° Hypertension Brother   ° Diabetes Father   ° Breast cancer Maternal Aunt   ° ° °Social History  ° °Socioeconomic History  ° Marital status: Legally Separated  °  Spouse name: [name]  ° Number of children: 1  ° Years of education: some colle  ° Highest education level: Not on file  °Occupational History  ° Occupation: Employed FT as data analyst  °  Comment: [employer] , NA  °Tobacco Use  ° Smoking status: Former  °  Packs/day: 0.25  °  Years: 0.50  °  Pack years: 0.13  °  Types: Cigarettes  ° Smokeless tobacco: Never  ° Tobacco comments:  °  Former smoker 11/03/21  °Vaping Use  ° Vaping Use: Never used  °Substance and Sexual Activity  ° Alcohol use: No  ° Drug use: No  ° Sexual activity: Yes  °   Birth control/protection: None  °Other Topics Concern  ° Not on file  °Social History Narrative  ° 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   ° °Social Determinants of Health  ° °Financial Resource Strain: Low Risk   ° Difficulty of Paying Living Expenses: Not very hard  °Food Insecurity: No Food Insecurity  ° Worried About Running Out of Food in the Last Year: Never true  ° Ran Out of Food in the Last Year: Never true  °Transportation Needs: No Transportation Needs  ° Lack of Transportation (Medical): No  ° Lack of Transportation (Non-Medical): No  °Physical Activity: Not on file  °Stress: Not on file  °Social Connections: Not on file  ° ° °Review of Systems: A 12 point ROS discussed and pertinent positives are indicated in the HPI above.  All other systems are negative. ° °Review of Systems  °Constitutional:  Positive for activity change and unexpected weight change.  °Respiratory:  Positive for shortness of breath.   °Cardiovascular:  Positive for chest pain and leg swelling.  °Endocrine: Positive for cold intolerance.  °Genitourinary: Negative.   °Musculoskeletal:  Positive for back pain.  °Allergic/Immunologic: Negative.   °Neurological: Negative.   °Hematological: Negative.   °Psychiatric/Behavioral: Negative.    °All other systems reviewed and are negative. ° °Vital Signs: °BP (!) 145/60    Pulse 65    Temp 98 °F (36.7 °C) (Oral)    Resp 15    SpO2 94%  ° °Physical Exam °Constitutional:   °   General: She is not in acute distress. °   Appearance: She is ill-appearing.  °HENT:  °   Head: Normocephalic and atraumatic.  °   Mouth/Throat:  °   Mouth: Mucous membranes are dry.  °   Pharynx: Oropharynx is clear.  °Eyes:  °   Extraocular Movements: Extraocular movements intact.  °   Conjunctiva/sclera: Conjunctivae normal.  °Cardiovascular:  °   Rate and Rhythm: Normal rate and regular rhythm.  °Pulmonary:  °   Effort: Pulmonary effort is normal.  °Musculoskeletal:  °   Right lower  leg: Edema present.  °   Left lower leg: Edema present.  °Skin: °   General: Skin is warm and dry.  °Neurological:  °   General: No focal deficit present.  °   Mental Status: She is alert and oriented to person,   alert and oriented to person, place, and time.  Psychiatric:        Mood and Affect: Affect is flat.    Imaging: DG Chest 2 View  Result Date: 02/14/2022 CLINICAL DATA:  Shortness of breath.  Leg swelling. EXAM: CHEST - 2 VIEW COMPARISON:  AP chest 12/22/2021 FINDINGS: Interval extubation. Cardiac silhouette and mediastinal contours are within normal limits. The lungs are clear with resolution of the prior bilateral heterogeneous opacities. No pleural effusion or pneumothorax. No acute skeletal abnormality. Likely cholecystectomy clips on lateral view. IMPRESSION: No active cardiopulmonary disease. Electronically Signed   By: Yvonne Kendall M.D.   On: 02/14/2022 15:31   DG Os Calcis Left  Result Date: 02/02/2022 CLINICAL DATA:  Diabetic foot ulcer with osteomyelitis. EXAM: LEFT OS CALCIS - 2+ VIEW COMPARISON:  10/27/2021. FINDINGS: A large soft tissue defect is present along the inferior and posterior aspect of the calcaneus to the level of the bone. Periosteal elevations and bony erosion is noted along the plantar aspect of the calcaneus in this region, compatible with osteomyelitis. No acute fracture or dislocation. IMPRESSION: Soft tissue defect along the inferior posterior aspect of the calcaneus. There is bony erosion and periosteal elevation involving the inferior aspect of the calcaneus, compatible with osteomyelitis. Electronically Signed   By: Brett Fairy M.D.   On: 02/02/2022 01:39    Labs:  CBC: Recent Labs    12/14/21 1233 12/22/21 1139 12/23/21 0551 12/23/21 1230 02/14/22 1500 02/15/22 0838  WBC 7.0  --  10.0 12.0* 9.6  --   HGB 8.8*   < > 10.3* 9.3* 8.8* 9.9*  HCT 28.9*   < > 31.2* 29.2* 27.0* 29.0*  PLT 254  --  291 282 320  --    < > = values in this interval not displayed.    COAGS: No  results for input(s): INR, APTT in the last 8760 hours.  BMP: Recent Labs    01/19/22 1235 01/26/22 1255 02/06/22 1158 02/14/22 1500 02/14/22 1639 02/15/22 0838  NA 134* 131* 131* 131*  --  134*  K 4.3 4.3 4.6 6.0*  --  4.8  CL 95* 94* 94* 95*  --  102  CO2 _0 --   --   GLUCOSE 153* 102* 155* 97  --  53*  BUN 61* 67* 76* 72*  --  67*  CALCIUM 9.1 9.0 8.7* 8.7*  --   --   CREATININE 5.31* 5.59* 5.97* 6.03* 6.20* 7.30*  GFRNONAA 9* 8* 8* 7* 7*  --     LIVER FUNCTION TESTS: Recent Labs    10/05/21 2223 10/06/21 0500 10/25/21 0400 10/26/21 0658 12/23/21 0551 02/14/22 1639  BILITOT 0.7 0.9  --  1.0  --  0.6  AST 11* 10*  --  9*  --  13*  ALT 16 16  --  9  --  9  ALKPHOS 55 53  --  46  --  46  PROT 7.2 6.9  --  6.7  --  7.7  ALBUMIN 2.8* 2.7* 3.0* 2.8* 3.1* 3.2*    Assessment and Plan:  ESRD --hypervolemic, in need of dialysis --for tunneled dialysis catheter with sedation, as schedule allows --currently NPO  Risks and benefits discussed with the patient including, but not limited to bleeding, infection, vascular injury, pneumothorax which may require chest tube placement, air embolism or even death  All of the patient's questions were answered, patient is agreeable to proceed. Consent signed and in IR.  this interesting consult.  I greatly enjoyed meeting Nyasia H Quebedeaux and look forward to participating in their care.  A copy of this report was sent to the requesting provider on this date. ° °Electronically Signed: ° , PA °02/15/2022, 9:32 AM ° ° °I spent a total of 40 Minutes in face to face in clinical consultation, greater than 50% of which was counseling/coordinating care for tunneled dialysis catheter. ° °

## 2022-02-15 NOTE — Progress Notes (Signed)
Requested to see pt for out-pt HD needs at d/c. Met with pt at bedside while in the ED. Introduced self and explained role. Pt is from home with husband who works 2nd shift. Pt prefers [facility] on MWF 1st shift if possible. If [facility] is not an option, pt agreeable to go to closest clinic to pt's home. Referral made to [facility] admissions this afternoon. Pt states that husband will assist with transportation when he can but pt will likely need transportation services to/from HD. Will advise TOC staff of this need once pt gets assigned to a room. Discussed TCU program and inquired if pt has a desire to do treatments at home in the future. Pt prefers 3x's a week in-center treatment at this time. Will assist as needed.  ? ?Melven Sartorius ?Renal Navigator ?[phone] ?

## 2022-02-15 NOTE — Procedures (Signed)
Interventional Radiology Procedure Note  Procedure: Tunneled HD Catheter  Indication: Renal Failure  Findings:  Catheter tip is at the cavo-atrial junction. Catheter is ready for sure. Please refer to procedural dictation for full description.  Complications: None  EBL: < 10 mL  Margueritte Guthridge, MD [phone]   

## 2022-02-15 NOTE — Progress Notes (Signed)
NEW ADMISSION NOTE ?New Admission Note:  ? ?Arrival Method: Ed stretcher ?Mental Orientation: AAOx4 ?Telemetry: 5M13 ?Assessment: Completed ?Skin: left heel wound, right heel callus ?IV: L arm ?Pain:0/10 ?Tubes: n/a ?Safety Measures: Safety Fall Prevention Plan has been given, discussed and signed ?Admission: Completed ?[facility] Orientation: Patient has been orientated to the room, unit and staff.  ?Family: none at bedside ? ?Orders have been reviewed and implemented. Will continue to monitor the patient. Call light has been placed within reach and bed alarm has been activated.  ? ?Vira Agar, RN   ?

## 2022-02-15 NOTE — Progress Notes (Signed)
Admit: 02/14/2022 ?LOS: 1 ? ?89F New ESRD ? ?Subjective:  ?For [facility] and HD#1 today ?Recently placed RUE AVF w/o B/T ? K 4.8, [place] up to 7.3, ?On RA, BPs stable ? ?Scheduled Meds: ? amLODipine  10 mg Oral Daily  ? aspirin EC  81 mg Oral Daily  ? atorvastatin  40 mg Oral Daily  ? calcium acetate  667 mg Oral TID WC  ? carvedilol  18.75 mg Oral BID WC  ? cephALEXin  500 mg Oral Q12H  ? Chlorhexidine Gluconate Cloth  6 each Topical Q0600  ? ferrous sulfate  325 mg Oral Q breakfast  ? furosemide  60 mg Intravenous Once  ? furosemide  80 mg Intravenous Q12H  ? heparin  5,000 Units Subcutaneous Q8H  ? insulin aspart  0-15 Units Subcutaneous TID WC  ? insulin aspart  0-5 Units Subcutaneous QHS  ? insulin glargine-yfgn  15 Units Subcutaneous QHS  ? risperiDONE  3 mg Oral QHS  ? sodium zirconium cyclosilicate  10 g Oral BID  ? ?Continuous Infusions: ? sodium chloride    ? sodium chloride    ?  ceFAZolin (ANCEF) IV    ? ?PRN Meds:.sodium chloride, sodium chloride, acetaminophen, alteplase, cloNIDine, heparin, lidocaine (PF), lidocaine-prilocaine, ondansetron **OR** ondansetron (ZOFRAN) IV, pentafluoroprop-tetrafluoroeth ? ?Current Labs: reviewed  ? Latest Reference Range & Units 02/15/22 08:46  ?Saturation Ratios 10.4 - 31.8 % 16  ?Ferritin 11 - 307 ng/mL 137  ? ? ?Physical Exam:  Blood pressure (!) 146/63, pulse 63, temperature 98 ?F (36.7 ?C), temperature source Oral, resp. rate 19, SpO2 100 %. ?Chronically ill appearing, NAD ?RRR ?CTAB but diminished in bases ?S/nt/nd ?3+ LEE, pitting ?RUE AVF NO B/T ?Nonfocal ? ?A ?Progressive CKD, new ESRD ?For [facility] 3/2 ?HD#1 thereafter: 2K 2L UF 250/300, 2.5h, no heparin ?CLIP started ?Failed RUE AVF: TDC today, VVS consulted for new access ?Hypervolemia / HTN: follow with UF ?Hyperkalemia: improved ?Anemia of CKD:   ?last Hb 13.4? CTM for now ?Fe def noted 3/2, start IV Fe with HD ?CKD-BMM: check P and PTH ?DM2: per [facility] ?Chrnic HFpEF: UF ? ?P ?As above ?Medication Issues; ?Preferred narcotic  agents for pain control are hydromorphone, fentanyl, and methadone. Morphine should not be used.  ?Baclofen should be avoided ?Avoid oral sodium phosphate and magnesium citrate based laxatives / bowel preps  ? ? ?Pearson Grippe MD ?02/15/2022, 11:23 AM ? ?Recent Labs  ?Lab 02/14/22 ?1500 02/14/22 ?1639 02/15/22 ?0838  ?NA 131*  --  134*  ?K 6.0*  --  4.8  ?CL 95*  --  102  ?CO2 26  --   --   ?GLUCOSE 97  --  53*  ?BUN 72*  --  67*  ?CREATININE 6.03* 6.20* 7.30*  ?CALCIUM 8.7*  --   --   ? ?Recent Labs  ?Lab 02/14/22 ?1500 02/15/22 ?0100 02/15/22 ?0846  ?WBC 9.6  --  7.8  ?NEUTROABS 6.6  --   --   ?HGB 8.8* 9.9* 13.4  ?HCT 27.0* 29.0* 39.2  ?MCV 88.5  --  93.6  ?PLT 320  --  180  ? ? ? ? ? ? ? ? ? ?  ?

## 2022-02-15 NOTE — ED Notes (Signed)
Pt transported to IR 

## 2022-02-15 NOTE — Progress Notes (Signed)
Heart Failure Navigator Progress Note ? ?Assessed for Heart & Vascular TOC clinic readiness.  ?Patient is already established with AHF clinic - sees Dr. Aundra Dubin. Notified team of admission. ? ?Kerby Nora, PharmD, BCPS ?Heart Failure Stewardship Pharmacist ?Phone [phone] ? ? ? ?

## 2022-02-16 ENCOUNTER — Encounter (HOSPITAL_COMMUNITY)
Admission: EM | Disposition: A | Discharge status: Home / Self Care | Payer: Self-pay | Source: Home / Self Care | Attending: Family Medicine

## 2022-02-16 ENCOUNTER — Encounter (HOSPITAL_BASED_OUTPATIENT_CLINIC_OR_DEPARTMENT_OTHER): Payer: Medicaid Other | Admitting: General Surgery

## 2022-02-16 DIAGNOSIS — N2581 Secondary hyperparathyroidism of renal origin: Secondary | ICD-10-CM | POA: Insufficient documentation

## 2022-02-16 DIAGNOSIS — Z87891 Personal history of nicotine dependence: Secondary | ICD-10-CM | POA: Insufficient documentation

## 2022-02-16 DIAGNOSIS — I132 Hypertensive heart and chronic kidney disease with heart failure and with stage 5 chronic kidney disease, or end stage renal disease: Secondary | ICD-10-CM | POA: Insufficient documentation

## 2022-02-16 DIAGNOSIS — Z992 Dependence on renal dialysis: Secondary | ICD-10-CM | POA: Insufficient documentation

## 2022-02-16 DIAGNOSIS — E1151 Type 2 diabetes mellitus with diabetic peripheral angiopathy without gangrene: Secondary | ICD-10-CM | POA: Insufficient documentation

## 2022-02-16 DIAGNOSIS — E785 Hyperlipidemia, unspecified: Secondary | ICD-10-CM | POA: Insufficient documentation

## 2022-02-16 DIAGNOSIS — N189 Chronic kidney disease, unspecified: Secondary | ICD-10-CM | POA: Insufficient documentation

## 2022-02-16 DIAGNOSIS — N049 Nephrotic syndrome with unspecified morphologic changes: Secondary | ICD-10-CM | POA: Diagnosis not present

## 2022-02-16 DIAGNOSIS — Z8673 Personal history of transient ischemic attack (TIA), and cerebral infarction without residual deficits: Secondary | ICD-10-CM | POA: Insufficient documentation

## 2022-02-16 LAB — CBC
HCT: 26.1 % — ABNORMAL LOW (ref 36.0–46.0)
Hemoglobin: 8.6 g/dL — ABNORMAL LOW (ref 12.0–15.0)
MCH: 29.2 pg (ref 26.0–34.0)
MCHC: 33 g/dL (ref 30.0–36.0)
MCV: 88.5 fL (ref 80.0–100.0)
Platelets: 297 10*3/uL (ref 150–400)
RBC: 2.95 MIL/uL — ABNORMAL LOW (ref 3.87–5.11)
RDW: 12.9 % (ref 11.5–15.5)
WBC: 9.4 10*3/uL (ref 4.0–10.5)
nRBC: 0 % (ref 0.0–0.2)

## 2022-02-16 LAB — HEMOGLOBIN A1C
Hgb A1c MFr Bld: 6.3 % — ABNORMAL HIGH (ref 4.8–5.6)
Mean Plasma Glucose: 134.11 mg/dL

## 2022-02-16 LAB — HEPATITIS B SURFACE ANTIGEN
Hepatitis B Surface Ag: NONREACTIVE
Hepatitis B Surface Ag: NONREACTIVE

## 2022-02-16 LAB — IRON AND TIBC

## 2022-02-16 LAB — GLUCOSE, CAPILLARY
Glucose-Capillary: 172 mg/dL — ABNORMAL HIGH (ref 70–99)
Glucose-Capillary: 121 mg/dL — ABNORMAL HIGH (ref 70–99)
Glucose-Capillary: 103 mg/dL — ABNORMAL HIGH (ref 70–99)
Glucose-Capillary: 232 mg/dL — ABNORMAL HIGH (ref 70–99)

## 2022-02-16 LAB — HEPATITIS B SURFACE ANTIBODY,QUALITATIVE: Hep B S Ab: NONREACTIVE

## 2022-02-16 LAB — HEPATITIS B CORE ANTIBODY, TOTAL: Hep B Core Total Ab: NONREACTIVE

## 2022-02-16 LAB — FERRITIN

## 2022-02-16 SURGERY — ARTERIOVENOUS (AV) FISTULA CREATION
Anesthesia: Monitor Anesthesia Care | Laterality: Left

## 2022-02-16 MED ORDER — COLLAGENASE 250 UNIT/GM EX OINT
TOPICAL_OINTMENT | Freq: Two times a day (BID) | CUTANEOUS | Status: DC
  Administered 2022-02-18: 1 via TOPICAL
  Filled 2022-02-16: qty 30

## 2022-02-16 MED ORDER — DARBEPOETIN ALFA 40 MCG/0.4ML IJ SOSY
40.0000 ug | PREFILLED_SYRINGE | INTRAMUSCULAR | Status: DC
  Administered 2022-02-17: 40 ug via INTRAVENOUS
  Filled 2022-02-16 (×2): qty 0.4

## 2022-02-16 MED ORDER — AQUAPHOR EX OINT
TOPICAL_OINTMENT | Freq: Two times a day (BID) | CUTANEOUS | Status: DC | PRN
  Filled 2022-02-16: qty 50

## 2022-02-16 NOTE — Progress Notes (Signed)
Admit: 02/14/2022 ?LOS: 2 ? ?31F New ESRD ? ?Subjective:  ?HD#1 yesterday after TDC: did well 2L UF ?AVF/G postponed to Monday with VVS ?Pt w/o c/o ?BPs stable ?K 4.5, P 6.5, HCO3 21 ? ?Scheduled Meds: ? amLODipine  10 mg Oral Daily  ? aspirin EC  81 mg Oral Daily  ? atorvastatin  40 mg Oral Daily  ? calcium acetate  667 mg Oral TID WC  ? carvedilol  18.75 mg Oral BID WC  ? cephALEXin  500 mg Oral Q12H  ? Chlorhexidine Gluconate Cloth  6 each Topical Q0600  ? collagenase   Topical BID  ? furosemide  60 mg Intravenous Once  ? furosemide  80 mg Intravenous Q12H  ? heparin  5,000 Units Subcutaneous Q8H  ? insulin aspart  0-15 Units Subcutaneous TID WC  ? insulin aspart  0-5 Units Subcutaneous QHS  ? insulin glargine-yfgn  15 Units Subcutaneous QHS  ? risperiDONE  3 mg Oral QHS  ? sodium zirconium cyclosilicate  10 g Oral BID  ? ?Continuous Infusions: ? ferric gluconate (FERRLECIT) IVPB 125 mg (02/15/22 2036)  ? ?PRN Meds:.acetaminophen, cloNIDine, mineral oil-hydrophilic petrolatum, ondansetron **OR** ondansetron (ZOFRAN) IV ? ?Current Labs: reviewed  ? Latest Reference Range & Units 02/15/22 08:46  ?Saturation Ratios 10.4 - 31.8 % 16  ?Ferritin 11 - 307 ng/mL 137  ? ? ?Physical Exam:  Blood pressure 138/62, pulse 78, temperature 99.5 ?F (37.5 ?C), resp. rate 19, weight 98.6 kg, SpO2 100 %. ?Chronically ill appearing, NAD ?RRR ?CTAB but diminished in bases ?S/nt/nd ?2+ LEE, pitting ?RUE AVF NO B/T ?Nonfocal ? ?A ?Progressive CKD, new ESRD ?S/p [facility] 3/2 with IR ?HD#1 3/3 ?Plan for HD#2 3/4: 2K 2-3L UF 300/500, 3h, no heparin ?CLIP started ?Failed RUE AVF: TDC today, VVS consulted for new access ?For AVF/G on Monday 3/6 ?Hypervolemia / HTN: follow with UF ?Hyperkalemia: improved; stop lokelma ?Anemia of CKD:   ?last Hb 13.4? CTM for now ?Fe def noted 3/2, start IV Fe with HD ?Begin ESA 3/4, qSat, Darbe 42mcg ?CKD-BMM: High P on PhosLo, PTH ordered ?DM2: per TR[facility] ?Chrnic HFpEF: UF with HD, stop furosemide ? ?P ?As  above ?Medication Issues; ?Preferred narcotic agents for pain control are hydromorphone, fentanyl, and methadone. Morphine should not be used.  ?Baclofen should be avoided ?Avoid oral sodium phosphate and magnesium citrate based laxatives / bowel preps  ? ? ?Pearson Grippe MD ?02/16/2022, 10:35 AM ? ?Recent Labs  ?Lab 02/14/22 ?1500 02/14/22 ?1639 02/15/22 ?9381 02/15/22 ?1944  ?NA 131*  --  134* 134*  ?K 6.0*  --  4.8 4.5  ?CL 95*  --  102 98  ?CO2 26  --   --  21*  ?GLUCOSE 97  --  53* 127*  ?BUN 72*  --  67* 72*  ?CREATININE 6.03* 6.20* 7.30* 6.17*  ?CALCIUM 8.7*  --   --  9.0  ?PHOS  --   --   --  6.5*  ? ? ?Recent Labs  ?Lab 02/14/22 ?1500 02/15/22 ?0175 02/15/22 ?1025 02/15/22 ?1944 02/16/22 ?0122  ?WBC 9.6  --  QUESTIONABLE RESULTS, RECOMMEND RECOLLECT TO VERIFY 9.2 9.4  ?NEUTROABS 6.6  --   --   --   --   ?HGB 8.8*   < > QUESTIONABLE RESULTS, RECOMMEND RECOLLECT TO VERIFY 10.6* 8.6*  ?HCT 27.0*   < > QUESTIONABLE RESULTS, RECOMMEND RECOLLECT TO VERIFY 32.0* 26.1*  ?MCV 88.5  --  QUESTIONABLE RESULTS, RECOMMEND RECOLLECT TO VERIFY 87.4 88.5  ?  PLT 320  --  QUESTIONABLE RESULTS, RECOMMEND RECOLLECT TO VERIFY 314 297  ? < > = values in this interval not displayed.  ? ? ? ? ? ? ? ? ? ? ?  ?

## 2022-02-16 NOTE — Progress Notes (Addendum)
PROGRESS NOTE    TA FAIR  MHD:[id number] DOB: [birth year] DOA: 02/14/2022 PCP: [name], MD   Brief Narrative:  Nancy [name] is a 61 y.o. female with medical history significant of CKD stage V, hypertension, type 2 diabetes mellitus, CAD, chronic anemia, depression and many other comorbidities  presented to ED with a complaint of bilateral leg swelling despite of being able to make urine. She has also noticed to have some exertional shortness of breath but no shortness of breath at rest. patient had graft placement in right arm on 12/22/2021 however that failed to mature.  She was admitted for the need of new hemodialysis.  Assessment & Plan:   Principal Problem:   Renal anasarca Active Problems:   Acute on chronic diastolic CHF (congestive heart failure) (HCC)   ESRD (end stage renal disease) (HCC)   Hyperkalemia  Renal anasarca/progressively worsening CKD stage V, now ESRD: TDC was placed by IR on 01/18/2022 and she underwent her first dialysis as well.  Nephrology on board.  Next [employer].  She will need clear process for outpatient hemodialysis.  Her right upper extremity BC AVF failed to mature.  She was seen by vascular surgery.  Plan was to do left upper extremity AVG but she ate breakfast and thus her surgery was canceled today and rescheduled for Monday.   Acute on chronic diastolic congestive heart failure: Has history of grade 2 diastolic dysfunction in the past.  Echo done only 5 months ago.  Chest x-ray clear but bilateral lower extremity edema.  Lasix stopped by nephrology now that she is on HD.   Hyperkalemia: Resolved.   Type 2 diabetes mellitus: She appears to be taking Lantus 30 units.  However she is on 15 units here and her blood sugar is controlled.  Continue SSI.   Essential hypertension: Controlled.  Continue home medications.   Morbid obesity: Weight loss counseling provided.   HLD: Continue atorvastatin.   History of CAD:  Asymptomatic.  Continue aspirin and Coreg.  Unstageable bilateral heel pressure wounds: Wound care on board.  DVT prophylaxis: heparin injection 5,000 Units Start: 02/14/22 2200   Code Status: Full Code  Family Communication:  None present at bedside.  Plan of care discussed with patient in length and he/she verbalized understanding and agreed with it.  Status is: Inpatient Remains inpatient appropriate because: Needs TDC in dialysis  Estimated body mass index is 38.51 kg/m as calculated from the following:   Height as of 12/22/21: 5\' 3"  (1.6 m).   Weight as of this encounter: 98.6 kg.  Pressure Injury 02/15/22 Heel Left Unstageable - Full thickness tissue loss in which the base of the injury is covered by slough (yellow, tan, gray, green or brown) and/or eschar (tan, brown or black) in the wound bed. (Active)  02/15/22 1856  Location: Heel  Location Orientation: Left  Staging: Unstageable - Full thickness tissue loss in which the base of the injury is covered by slough (yellow, tan, gray, green or brown) and/or eschar (tan, brown or black) in the wound bed.  Wound Description (Comments):   Present on Admission: Yes   Nutritional Assessment: Body mass index is 38.51 kg/m.Marland Kitchen Seen by dietician.  I agree with the assessment and plan as outlined below: Nutrition Status:        . Skin Assessment: I have examined the patient's skin and I agree with the wound assessment as performed by the wound care RN as outlined below: Pressure Injury 02/15/22 Heel Left  Unstageable - Full thickness tissue loss in which the base of the injury is covered by slough (yellow, tan, gray, green or brown) and/or eschar (tan, brown or black) in the wound bed. (Active)  02/15/22 1856  Location: Heel  Location Orientation: Left  Staging: Unstageable - Full thickness tissue loss in which the base of the injury is covered by slough (yellow, tan, gray, green or brown) and/or eschar (tan, brown or black) in the  wound bed.  Wound Description (Comments):   Present on Admission: Yes    Consultants:  Nephrology IR  Procedures:  None  Antimicrobials:  Anti-infectives (From admission, onward)    Start     Dose/Rate Route Frequency Ordered Stop   02/15/22 1300  ceFAZolin (ANCEF) IVPB 2g/100 mL premix        2 g 200 mL/hr over 30 Minutes Intravenous To Radiology 02/15/22 1224 02/15/22 1416   02/15/22 0800  ceFAZolin (ANCEF) IVPB 1 g/50 mL premix  Status:  Discontinued        1 g 100 mL/hr over 30 Minutes Intravenous To Radiology 02/15/22 0746 02/15/22 1224   02/14/22 2200  cephALEXin (KEFLEX) capsule 500 mg        500 mg Oral Every 12 hours 02/14/22 1747           Subjective:  Seen and examined.  She appears to be very weak physically.  She has no other complaint.  Objective: Vitals:   02/16/22 0323 02/16/22 0348 02/16/22 0500 02/16/22 0847  BP: 127/89 (!) 157/105  138/62  Pulse: 73 75  78  Resp: (!) 21 19  19   Temp: 98.2 F (36.8 C) 99.9 F (37.7 C)  99.5 F (37.5 C)  TempSrc: Oral Oral    SpO2: 100%   100%  Weight:   98.6 kg     Intake/Output Summary (Last 24 hours) at 02/16/2022 1448 Last data filed at 02/16/2022 1400 Gross per 24 hour  Intake 900 ml  Output 2650 ml  Net -1750 ml    Filed Weights   02/16/22 0023 02/16/22 0500  Weight: 99.6 kg 98.6 kg    Examination:  General exam: Appears calm and comfortable, obese Respiratory system: Clear to auscultation. Respiratory effort normal. Cardiovascular system: S1 & S2 heard, RRR. No JVD, murmurs, rubs, gallops or clicks.  +2 pitting edema bilateral lower extremity. Gastrointestinal system: Abdomen is nondistended, soft and nontender. No organomegaly or masses felt. Normal bowel sounds heard. Central nervous system: Alert and oriented. No focal neurological deficits. Extremities: Symmetric 5 x 5 power. Skin: No rashes, lesions or ulcers.  Psychiatry: Judgement and insight appear normal. Mood & affect appropriate.    Data Reviewed: I have personally reviewed following labs and imaging studies  CBC: Recent Labs  Lab 02/14/22 1500 02/15/22 0838 02/15/22 0846 02/15/22 1944 02/16/22 0122  WBC 9.6  --  QUESTIONABLE RESULTS, RECOMMEND RECOLLECT TO VERIFY 9.2 9.4  NEUTROABS 6.6  --   --   --   --   HGB 8.8* 9.9* QUESTIONABLE RESULTS, RECOMMEND RECOLLECT TO VERIFY 10.6* 8.6*  HCT 27.0* 29.0* QUESTIONABLE RESULTS, RECOMMEND RECOLLECT TO VERIFY 32.0* 26.1*  MCV 88.5  --  QUESTIONABLE RESULTS, RECOMMEND RECOLLECT TO VERIFY 87.4 88.5  PLT 320  --  QUESTIONABLE RESULTS, RECOMMEND RECOLLECT TO VERIFY 314 376    Basic Metabolic Panel: Recent Labs  Lab 02/14/22 1500 02/14/22 1639 02/15/22 0838 02/15/22 1944  NA 131*  --  134* 134*  K 6.0*  --  4.8 4.5  CL 95*  --  102 98  CO2 26  --   --  21*  GLUCOSE 97  --  53* 127*  BUN 72*  --  67* 72*  CREATININE 6.03* 6.20* 7.30* 6.17*  CALCIUM 8.7*  --   --  9.0  MG  --  2.7*  --   --   PHOS  --   --   --  6.5*    GFR: Estimated Creatinine Clearance: 10.7 mL/min (A) (by C-G formula based on SCr of 6.17 mg/dL (H)). Liver Function Tests: Recent Labs  Lab 02/14/22 1639 02/15/22 1944  AST 13* 20  ALT 9 15  ALKPHOS 46 61  BILITOT 0.6 0.3  PROT 7.7 7.8  ALBUMIN 3.2* 3.2*    No results for input(s): LIPASE, AMYLASE in the last 168 hours. No results for input(s): AMMONIA in the last 168 hours. Coagulation Profile: No results for input(s): INR, PROTIME in the last 168 hours. Cardiac Enzymes: No results for input(s): CKTOTAL, CKMB, CKMBINDEX, TROPONINI in the last 168 hours. BNP (last 3 results) No results for input(s): PROBNP in the last 8760 hours. HbA1C: Recent Labs    02/16/22 0123  HGBA1C 6.3*   CBG: Recent Labs  Lab 02/15/22 1820 02/15/22 1830 02/15/22 2111 02/16/22 0726 02/16/22 1126  GLUCAP 37* 181* 186* 172* 121*    Lipid Profile: No results for input(s): CHOL, HDL, LDLCALC, TRIG, CHOLHDL, LDLDIRECT in the last 72  hours. Thyroid Function Tests: No results for input(s): TSH, T4TOTAL, FREET4, T3FREE, THYROIDAB in the last 72 hours. Anemia Panel: Recent Labs    02/15/22 0846 02/15/22 1944  FERRITIN QUESTIONABLE RESULTS, RECOMMEND RECOLLECT TO VERIFY 254  TIBC QUESTIONABLE RESULTS, RECOMMEND RECOLLECT TO VERIFY 227*  IRON QUESTIONABLE RESULTS, RECOMMEND RECOLLECT TO VERIFY 52    Sepsis Labs: No results for input(s): PROCALCITON, LATICACIDVEN in the last 168 hours.  Recent Results (from the past 240 hour(s))  Resp Panel by RT-PCR (Flu A&B, Covid) Nasopharyngeal Swab     Status: None   Collection Time: 02/14/22  4:40 PM   Specimen: Nasopharyngeal Swab; Nasopharyngeal(NP) swabs in vial transport medium  Result Value Ref Range Status   SARS Coronavirus 2 by RT PCR NEGATIVE NEGATIVE Final    Comment: (NOTE) SARS-CoV-2 target nucleic acids are NOT DETECTED.  The SARS-CoV-2 RNA is generally detectable in upper respiratory specimens during the acute phase of infection. The lowest concentration of SARS-CoV-2 viral copies this assay can detect is 138 copies/mL. A negative result does not preclude SARS-Cov-2 infection and should not be used as the sole basis for treatment or other patient management decisions. A negative result may occur with  improper specimen collection/handling, submission of specimen other than nasopharyngeal swab, presence of viral mutation(s) within the areas targeted by this assay, and inadequate number of viral copies(<138 copies/mL). A negative result must be combined with clinical observations, patient history, and epidemiological information. The expected result is Negative.  Fact Sheet for Patients:  EntrepreneurPulse.com.au  Fact Sheet for Healthcare Providers:  IncredibleEmployment.be  This test is no t yet approved or cleared by the [place] FDA and  has been authorized for detection and/or diagnosis of SARS-CoV-2 by FDA  under an Emergency Use Authorization (EUA). This EUA will remain  in effect (meaning this test can be used) for the duration of the COVID-19 declaration under Section 564(b)(1) of the Act, 21 U.S.C.section 360bbb-3(b)(1), unless the authorization is terminated  or revoked sooner.       Influenza A by PCR NEGATIVE NEGATIVE Final  Influenza B by PCR NEGATIVE NEGATIVE Final    Comment: (NOTE) The Xpert Xpress SARS-CoV-2/FLU/RSV plus assay is intended as an aid in the diagnosis of influenza from Nasopharyngeal swab specimens and should not be used as a sole basis for treatment. Nasal washings and aspirates are unacceptable for Xpert Xpress SARS-CoV-2/FLU/RSV testing.  Fact Sheet for Patients: EntrepreneurPulse.com.au  Fact Sheet for Healthcare Providers: IncredibleEmployment.be  This test is not yet approved or cleared by the [place] FDA and has been authorized for detection and/or diagnosis of SARS-CoV-2 by FDA under an Emergency Use Authorization (EUA). This EUA will remain in effect (meaning this test can be used) for the duration of the COVID-19 declaration under Section 564(b)(1) of the Act, 21 U.S.C. section 360bbb-3(b)(1), unless the authorization is terminated or revoked.  Performed at [facility], [place] [address], [place], [place] [address]       Radiology Studies: DG Chest 2 View  Result Date: 02/14/2022 CLINICAL DATA:  Shortness of breath.  Leg swelling. EXAM: CHEST - 2 VIEW COMPARISON:  AP chest 12/22/2021 FINDINGS: Interval extubation. Cardiac silhouette and mediastinal contours are within normal limits. The lungs are clear with resolution of the prior bilateral heterogeneous opacities. No pleural effusion or pneumothorax. No acute skeletal abnormality. Likely cholecystectomy clips on lateral view. IMPRESSION: No active cardiopulmonary disease. Electronically Signed   By: Yvonne Kendall M.D.   On: 02/14/2022 15:31   IR  Fluoro Guide CV Line Right  Result Date: 02/15/2022 INDICATION: Renal failure EXAM: TUNNELED CENTRAL VENOUS HEMODIALYSIS CATHETER PLACEMENT WITH ULTRASOUND AND FLUOROSCOPIC GUIDANCE MEDICATIONS: Ancef 2 g IV. The antibiotic was given in an appropriate time interval prior to skin puncture. ANESTHESIA/SEDATION: Moderate (conscious) sedation was employed during this procedure. A total of Versed 1 mg and Fentanyl 50 mcg was administered intravenously. Moderate Sedation Time: 18 minutes. The patient's level of consciousness and vital signs were monitored continuously by radiology nursing throughout the procedure under my direct supervision. FLUOROSCOPY TIME:  Fluoroscopy Time: 0 minutes 12 seconds (1 mGy). COMPLICATIONS: None immediate. PROCEDURE: Informed written consent was obtained from the patient after a discussion of the risks, benefits, and alternatives to treatment. Questions regarding the procedure were encouraged and answered. The right neck and chest were prepped with chlorhexidine in a sterile fashion, and a sterile drape was applied covering the operative field. Maximum barrier sterile technique with sterile gowns and gloves were used for the procedure. A timeout was performed prior to the initiation of the procedure. The right internal jugular vein was evaluated with ultrasound and shown to be patent. A permanent ultrasound image was obtained and placed in the patient's medical record. Using sterile gel and a sterile probe cover, the right internal jugular vein was entered with a 21 ga needle during real time ultrasound guidance. 0.018 inch guidewire placed and 21 ga needle exchanged for transitional dilator set. Utilizing fluoroscopy, 0.035 inch guidewire advanced through the needle without difficulty. Seriel dilation was performed and peel-away sheath was placed. Attention then turned to the right anterior upper chest. Following local lidocaine administration, the hemodialysis catheter was tunneled from  the chest wall to the venotomy site. The catheter was inserted through the peel-away sheath. The tip of the catheter was positioned within the right atrium using fluoroscopic guidance. All lumens of the catheter aspirated and flushed well. The dialysis lumens were locked with Heparin. The catheter was secured to the skin with suture. The insertion site was covered with sterile dressing. IMPRESSION: Successful placement of 19 cm tip  to cuff tunneled hemodialysis catheter via the right internal jugular vein with tips terminating within the superior aspect of the right atrium. The catheter is ready for immediate use. Electronically Signed   By: Miachel Roux M.D.   On: 02/15/2022 15:59   IR US Guide Vasc Access Right  Result Date: 02/15/2022 INDICATION: Renal failure EXAM: TUNNELED CENTRAL VENOUS HEMODIALYSIS CATHETER PLACEMENT WITH ULTRASOUND AND FLUOROSCOPIC GUIDANCE MEDICATIONS: Ancef 2 g IV. The antibiotic was given in an appropriate time interval prior to skin puncture. ANESTHESIA/SEDATION: Moderate (conscious) sedation was employed during this procedure. A total of Versed 1 mg and Fentanyl 50 mcg was administered intravenously. Moderate Sedation Time: 18 minutes. The patient's level of consciousness and vital signs were monitored continuously by radiology nursing throughout the procedure under my direct supervision. FLUOROSCOPY TIME:  Fluoroscopy Time: 0 minutes 12 seconds (1 mGy). COMPLICATIONS: None immediate. PROCEDURE: Informed written consent was obtained from the patient after a discussion of the risks, benefits, and alternatives to treatment. Questions regarding the procedure were encouraged and answered. The right neck and chest were prepped with chlorhexidine in a sterile fashion, and a sterile drape was applied covering the operative field. Maximum barrier sterile technique with sterile gowns and gloves were used for the procedure. A timeout was performed prior to the initiation of the procedure. The  right internal jugular vein was evaluated with ultrasound and shown to be patent. A permanent ultrasound image was obtained and placed in the patient's medical record. Using sterile gel and a sterile probe cover, the right internal jugular vein was entered with a 21 ga needle during real time ultrasound guidance. 0.018 inch guidewire placed and 21 ga needle exchanged for transitional dilator set. Utilizing fluoroscopy, 0.035 inch guidewire advanced through the needle without difficulty. Seriel dilation was performed and peel-away sheath was placed. Attention then turned to the right anterior upper chest. Following local lidocaine administration, the hemodialysis catheter was tunneled from the chest wall to the venotomy site. The catheter was inserted through the peel-away sheath. The tip of the catheter was positioned within the right atrium using fluoroscopic guidance. All lumens of the catheter aspirated and flushed well. The dialysis lumens were locked with Heparin. The catheter was secured to the skin with suture. The insertion site was covered with sterile dressing. IMPRESSION: Successful placement of 19 cm tip to cuff tunneled hemodialysis catheter via the right internal jugular vein with tips terminating within the superior aspect of the right atrium. The catheter is ready for immediate use. Electronically Signed   By: Miachel Roux M.D.   On: 02/15/2022 15:59    Scheduled Meds:  amLODipine  10 mg Oral Daily   aspirin EC  81 mg Oral Daily   atorvastatin  40 mg Oral Daily   calcium acetate  667 mg Oral TID WC   carvedilol  18.75 mg Oral BID WC   cephALEXin  500 mg Oral Q12H   Chlorhexidine Gluconate Cloth  6 each Topical Q0600   collagenase   Topical BID   [START ON 02/17/2022] darbepoetin (ARANESP) injection - DIALYSIS  40 mcg Intravenous Q Sat-HD   furosemide  60 mg Intravenous Once   heparin  5,000 Units Subcutaneous Q8H   insulin aspart  0-15 Units Subcutaneous TID WC   insulin aspart  0-5  Units Subcutaneous QHS   insulin glargine-yfgn  15 Units Subcutaneous QHS   risperiDONE  3 mg Oral QHS   Continuous Infusions:  ferric gluconate (FERRLECIT) IVPB 125 mg (02/15/22 2036)  LOS: 2 days   Time spent: 28 minutes  Darliss Cheney, MD [facility]  02/16/2022, 2:48 PM  Please page via [place] and do not message via secure chat for urgent patient care matters. Secure chat can be used for non urgent patient care matters.  How to contact the [facility] Attending or Consulting provider [place] or covering provider during after hours [place], for this patient?  Check the care team in [facility] and look for a) attending/consulting [facility] provider listed and b) the [facility] team listed. Page or secure chat [phone]. Log into [email or web address] and use [place] universal password to access. If you do not have the password, please contact the hospital operator. Locate the [facility] provider you are looking for under [facility] and page to a number that you can be directly reached. If you still have difficulty reaching the provider, please page the [facility] (Director on Call) for the Hospitalists listed on amion for assistance.

## 2022-02-16 NOTE — Anesthesia Preprocedure Evaluation (Deleted)
Anesthesia Evaluation  ? ? ?Reviewed: ?Allergy & Precautions, Patient's Chart, lab work & pertinent test results ? ?History of Anesthesia Complications ?Negative for: history of anesthetic complications ? ?Airway ? ? ? ? ? ? ? Dental ?  ?Pulmonary ?neg pulmonary ROS, former smoker,  ?  ? ? ? ? ? ? ? Cardiovascular ?hypertension, Pt. on medications and Pt. on home beta blockers ?+ CAD, + Peripheral Vascular Disease and +CHF  ?+ Valvular Problems/Murmurs  ? ? ?  ?Neuro/Psych ?PSYCHIATRIC DISORDERS Depression  ?Hearing loss ? ?CVA   ? GI/Hepatic ?Neg liver ROS, GERD  , ?IBS ? ?  ?Endo/Other  ?diabetes, Type 2, Insulin Dependent ?Obesity ? ? Renal/GU ?ESRFRenal disease  ? ?  ?Musculoskeletal ?negative musculoskeletal ROS ?(+)  ? Abdominal ?  ?Peds ? Hematology ? ?(+) Blood dyscrasia, anemia ,   ?Anesthesia Other Findings ? ? Reproductive/Obstetrics ? ?  ? ? ? ? ? ? ? ? ? ? ? ? ? ?  ?  ? ? ? ? ? ? ? ? ?Anesthesia Physical ?Anesthesia Plan ? ?ASA: 4 ? ?Anesthesia Plan: MAC  ? ?Post-op Pain Management: Tylenol PO (pre-op)*  ? ?Induction:  ? ?PONV Risk Score and Plan: 2 and Propofol infusion and Treatment may vary due to age or medical condition ? ?Airway Management Planned: Nasal Cannula and Natural Airway ? ?Additional Equipment: None ? ?Intra-op Plan:  ? ?Post-operative Plan:  ? ?Informed Consent:  ? ?Plan Discussed with: CRNA and Anesthesiologist ? ?Anesthesia Plan Comments:   ? ? ? ? ? ? ?Anesthesia Quick Evaluation ? ?

## 2022-02-16 NOTE — Interval H&P Note (Signed)
History and Physical Interval Note: ? ?02/16/2022 ?7:44 AM ? ?Nancy [name]  has presented today for surgery, with the diagnosis of ESRD.  The various methods of treatment have been discussed with the patient and family. After consideration of risks, benefits and other options for treatment, the patient has consented to  Procedure(s): ?LEFT ARTERIOVENOUS (AV) FISTULA VS GRAFT (Left) as a surgical intervention.  The patient's history has been reviewed, patient examined, no change in status, stable for surgery.  I have reviewed the patient's chart and labs.  Questions were answered to the patient's satisfaction.   ? ? ?[name] ? ? ?

## 2022-02-16 NOTE — Progress Notes (Signed)
Pt has been accepted at [facility] on MWF. Pt will need to arrive for first appt at 11:15 to complete paperwork prior to 12:20 chair time. Met with pt and pt's husband at bedside ( I spoke to pt's husband via phone earlier today as well). Discussed out-pt HD arrangements and schedule letter provided. Pt and pt's husband agreeable to plan. Pt's husband plans to transport pt to HD appts at d/c. Pt can start at clinic as soon as Monday but plans for procedure on Monday noted. Update provided to nephrologist. Will assist as needed.  ? ?Melven Sartorius ?Renal Navigator ?[phone] ?

## 2022-02-16 NOTE — Plan of Care (Signed)
?  Problem: Education: ?Goal: Ability to demonstrate management of disease process will improve ?Outcome: Progressing ?  ?Problem: Education: ?Goal: Knowledge of disease and its progression will improve ?Outcome: Progressing ?  ?

## 2022-02-16 NOTE — Consult Note (Signed)
WOC Nurse Consult Note: ?Patient receiving care in [facility] [phone] ?Reason for Consult: Heel wounds ?Wound type: Unstageable bilateral heel pressure injury's ?Pressure Injury POA: Yes ?Measurement: Left heel 7 x 6 x 0.6 ?Right heel 8 x 6 x 0.1 ?Wound bed: Left heel is 90% slough with 10% pink tissue with some odor ?Right heel is 85% DTI with 15% yellow/black ?Drainage (amount, consistency, odor)  ?Periwound: very dry ?Dressing procedure/placement/frequency: ? ?Apply Aquaphor to Both lower legs and feet (not the heels) twice daily. ? ?Apply Santyl in a nickel thick layer to bilateral heels twice daily followed by a moistened saline gauze, ABD pad and wrap with Kerlix ? ?Monitor the wound area(s) for worsening of condition such as: ?Signs/symptoms of infection, increase in size, development of or worsening of odor, ?development of pain, or increased pain at the affected locations.   ?Notify the medical team if any of these develop. ? ?Thank you for the consult. [facility] nurse will not follow at this time.   ?Please re-consult the [place] team if needed. ? ?Cathlean [place]se[place]. Tamala Julian, MSN, RN, CMSRN, AGCNS, WTA ?Wound Treatment Associate ?Pager [phone]   ?

## 2022-02-16 NOTE — Progress Notes (Signed)
? ?  VASCULAR SURGERY ASSESSMENT & PLAN:  ? ?END-STAGE RENAL DISEASE: Her surgery this morning was canceled because she ate breakfast at 8 AM.  Based on her upper extremity arterial duplex and vein map her best chance for a fistula is likely in the right arm.  I will plan placement of new access on Monday.  She has a failed right brachiocephalic fistula which was placed in January.  Her options will either be a for stage basilic vein transposition or an AV graft in the right arm.  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 ? ?Please do not dialyze the patient on Monday as she is scheduled for surgery. ? ? ?SUBJECTIVE:  ? ? No complaints.  She just ate breakfast. ? ?PHYSICAL EXAM:  ? ?Vitals:  ? 02/16/22 0253 02/16/22 0323 02/16/22 0348 02/16/22 0500  ?BP: (!) 114/55 127/89 (!) 157/105   ?Pulse:  73 75   ?Resp: 16 (!) 21 19   ?Temp:  98.2 ?F (36.8 ?C) 99.9 ?F (37.7 ?C)   ?TempSrc:  Oral Oral   ?SpO2: 100% 100%    ?Weight:    98.6 kg  ? ?IV in left arm. ?Palpable right radial pulse. ? ?LABS:  ? ?Lab Results  ?Component Value Date  ? WBC 9.4 02/16/2022  ? HGB 8.6 (L) 02/16/2022  ? HCT 26.1 (L) 02/16/2022  ? MCV 88.5 02/16/2022  ? PLT 297 02/16/2022  ? ?Lab Results  ?Component Value Date  ? CREATININE 6.17 (H) 02/15/2022  ? ?Lab Results  ?Component Value Date  ? INR 0.91 11/13/2017  ? ?CBG (last 3)  ?Recent Labs  ?  02/15/22 ?1830 02/15/22 ?2111 02/16/22 ?0726  ?GLUCAP 181* 186* 172*  ? ? ?PROBLEM LIST:   ? ?Principal Problem: ?  Renal anasarca ?Active Problems: ?  Acute on chronic diastolic CHF (congestive heart failure) ([place]) ?  ESRD (end stage renal disease) ([place]) ?  Hyperkalemia ? ? ?CURRENT MEDS:  ? ? amLODipine  10 mg Oral Daily  ? aspirin EC  81 mg Oral Daily  ? atorvastatin  40 mg Oral Daily  ? calcium acetate  667 mg Oral TID WC  ? carvedilol  18.75 mg Oral BID WC  ? cephALEXin  500 mg Oral Q12H  ? Chlorhexidine Gluconate Cloth  6 each Topical Q0600  ? furosemide  60 mg Intravenous  Once  ? furosemide  80 mg Intravenous Q12H  ? heparin  5,000 Units Subcutaneous Q8H  ? insulin aspart  0-15 Units Subcutaneous TID WC  ? insulin aspart  0-5 Units Subcutaneous QHS  ? insulin glargine-yfgn  15 Units Subcutaneous QHS  ? risperiDONE  3 mg Oral QHS  ? sodium zirconium cyclosilicate  10 g Oral BID  ? ? ?[name] ?Office: [phone] ?02/16/2022 ? ?

## 2022-02-16 NOTE — Interval H&P Note (Signed)
History and Physical Interval Note: ? ?02/16/2022 ?8:03 AM ? ?[name]  has presented today for surgery, with the diagnosis of ESRD.  The various methods of treatment have been discussed with the patient and family. After consideration of risks, benefits and other options for treatment, the patient has consented to  Procedure(s): ?LEFT ARTERIOVENOUS (AV) FISTULA VS GRAFT (Left) as a surgical intervention.  The patient's history has been reviewed, patient examined, no change in status, stable for surgery.  I have reviewed the patient's chart and labs.  Questions were answered to the patient's satisfaction.   ? ? ?[name] ? ? ?

## 2022-02-16 NOTE — TOC Initial Note (Addendum)
Transition of Care (TOC) - Initial/Assessment Note  ? ? ?Patient Details  ?Name: Nancy [name] ?MRN: [id number] ?Date of Birth: 1961-07-23 ? ?Transition of Care (TOC) CM/SW Contact:    ?Tom-Johnson, Renea Ee, RN ?Phone Number: ?02/16/2022, 3:07 PM ? ?Clinical Narrative:                 ? ?Patient is admitted for Renal Anasarca. Lives at [place] with husband. CM called and spoke with husband, [name] as patient is too sleepy to assess. [name] states they have one daughter. Patient is on disability. Has a cane, walker and shower seat at home. PCP is [name], MD and uses [facility]. Goes to dialysis on MWF schedule and husband transports. No recommendations or needs noted at this time. CM will continue to follow with needs. ? ? ?Expected Discharge Plan: Home/Self Care ?Barriers to Discharge: Continued Medical Work up ? ? ?Patient Goals and CMS Choice ?Patient states their goals for this hospitalization and ongoing recovery are:: To return home ?CMS Medicare.gov Compare Post Acute Care list provided to:: Patient ?Choice offered to / list presented to : NA ? ?Expected Discharge Plan and Services ?Expected Discharge Plan: Home/Self Care ?  ?Discharge Planning Services: CM Consult ?Post Acute Care Choice: NA ?Living arrangements for the past 2 months: [place] ?                ?DME Arranged: N/A ?DME Agency: NA ?  ?  ?  ?HH Arranged: NA ?[place] Agency: NA ?  ?  ?  ? ?Prior Living Arrangements/Services ?Living arrangements for the past 2 months: [place] ?Lives with:: Spouse ?Patient language and need for interpreter reviewed:: Yes ?Do you feel safe going back to the place where you live?: Yes      ?Need for Family Participation in Patient Care: Yes (Comment) ?Care giver support system in place?: Yes (comment) ?Current home services:  ([facility], [facility]) ?Criminal Activity/Legal Involvement Pertinent to Current Situation/Hospitalization: No - Comment as  needed ? ?Activities of Daily Living ?Home Assistive Devices/Equipment: Other (Comment) (Pt unable  to tell. Cannot recall) ?ADL Screening (condition at time of admission) ?Patient's cognitive ability adequate to safely complete daily activities?: No ?Is the patient deaf or have difficulty hearing?: Yes ?Does the patient have difficulty seeing, even when wearing glasses/contacts?: No ?Does the patient have difficulty concentrating, remembering, or making decisions?: Yes ?Patient able to express need for assistance with ADLs?: Yes ?Does the patient have difficulty dressing or bathing?: Yes ?Independently performs ADLs?: No ?Communication: Dependent ?Is this a change from baseline?: Pre-admission baseline ?Dressing (OT): Dependent ?Is this a change from baseline?: Pre-admission baseline ?Grooming: Dependent ?Is this a change from baseline?: Pre-admission baseline ?Feeding: Independent ?Bathing: Dependent ?Is this a change from baseline?: Pre-admission baseline ?Toileting: Dependent ?Is this a change from baseline?: Pre-admission baseline ?In/Out Bed: Dependent ?Is this a change from baseline?: Pre-admission baseline ?Walks in Home: Dependent (Pt reports she is unable to walk) ?Is this a change from baseline?: Pre-admission baseline ?Does the patient have difficulty walking or climbing stairs?: Yes ?Weakness of Legs: Both ?Weakness of Arms/Hands: None ? ?Permission Sought/Granted ?Permission sought to share information with : Case Manager, Family Supports ?Permission granted to share information with : Yes, Verbal Permission Granted ?   ?   ?   ?   ? ?Emotional Assessment ?Appearance:: Appears stated age ?Attitude/Demeanor/Rapport: Unable to Assess (Patient too sleepy to assess.) ?Affect (typically observed): Unable to  Assess (Patient too sleepy to assess.) ?Orientation: : Oriented to Self ?Alcohol / Substance Use: Not Applicable ?Psych Involvement: No (comment) ? ?Admission diagnosis:  Anasarca [R60.1] ?ESRD (end  stage renal disease) ([place]) [N18.6] ?Renal anasarca [N04.9] ?Patient Active Problem List  ? Diagnosis Date Noted  ? Renal anasarca 02/14/2022  ? ESRD (end stage renal disease) ([place]) 02/14/2022  ? Hyperkalemia 02/14/2022  ? S/P arteriovenous (AV) fistula creation 12/22/2021  ? Critical limb ischemia of both lower extremities ([place]) 11/22/2021  ? Osteomyelitis ([place])   ? Peripheral artery disease ([place]) 10/27/2021  ? Acute exacerbation of congestive heart failure ([place]) 10/24/2021  ? Acute cardiogenic pulmonary edema ([place]) 10/06/2021  ? Coronary artery disease involving native coronary artery of native heart without angina pectoris 10/06/2021  ? Goals of care, counseling/discussion   ? Gangrene of left foot ([place])   ? Cellulitis of left foot   ? Diabetic ulcer of left foot ([place]) 09/04/2021  ? CHF (congestive heart failure) ([place]) 07/07/2021  ? Normocytic anemia 06/30/2021  ? CKD (chronic kidney disease), stage IV ([place]) 06/11/2021  ? Acute on chronic diastolic CHF (congestive heart failure) ([place]) 06/10/2021  ? Abnormal nuclear stress test   ? Dyspnea on exertion 03/07/2021  ? Orthopnea 02/25/2021  ? History of cerebrovascular accident (CVA) with residual deficit 05/06/2020  ? Diabetic peripheral neuropathy ([place]) 04/26/2020  ? AKI (acute kidney injury) ([place]) 04/20/2020  ? Generalized weakness 04/20/2020  ? Dehydration 04/20/2020  ? Generalized abdominal pain   ? Pancreatitis, acute 02/29/2020  ? Cerebrovascular accident (CVA) ([place]) 11/08/2019  ? Stenosis of right carotid artery 11/08/2019  ? Mixed diabetic hyperlipidemia associated with type 2 diabetes mellitus ([place]) 11/08/2019  ? Cortical age-related cataract of both eyes 08/30/2019  ? Gait abnormality 08/30/2019  ? Statin declined 08/30/2019  ? Gastroesophageal reflux disease without esophagitis 04/18/2018  ? 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 Type 2 diabetes mellitus with stage 4 chronic kidney disease, with long-term current use of insulin ([place]) 10/29/2017  ? History of macular  degeneration 10/29/2017  ? Chronic cervical pain 10/29/2016  ? Myalgia 09/14/2016  ? Insomnia 09/14/2016  ? Tinea pedis of both feet 08/20/2016  ? Macular degeneration 08/17/2016  ? Low back pain 09/21/2014  ? Hypertensive urgency 09/21/2014  ? Diabetic gastroparesis associated with type 2 diabetes mellitus ([place]) 09/14/2014  ? Uncontrolled hypertension 09/08/2014  ? Thyroid nodule 08/17/2014  ? Obesity (BMI 30-39.9) 08/17/2014  ? Nausea & vomiting 08/04/2014  ? Type II diabetes mellitus with neurological manifestations, uncontrolled 08/04/2014  ? ?PCP:  [name], MD ?Pharmacy:   ?[name] ?[address] ?[place] [address] ?Phone: [phone] Fax: [phone] ? ?[place] [place] ?[address] ?[place] [address] ?Phone: [phone] Fax: [phone] ? ? ? ? ?Social Determinants of Health (SDOH) Interventions ?  ? ?Readmission Risk Interventions ?Readmission Risk Prevention Plan 02/16/2022 09/21/2021 03/07/2020  ?Transportation Screening Complete Complete Complete  ?PCP or Specialist Appt within 3-5 Days - - Complete  ?[place] or Home Care Consult - - (No Data)  ?Social Work Consult for [place] Planning/Counseling - - Complete  ?Palliative Care Screening - - Not Applicable  ?Medication Review Press photographer) Complete Complete Complete  ?PCP or Specialist appointment within 3-5 days of discharge - Complete -  ?[place] or [place] - Complete -  ?SW Recovery Care/Counseling Consult - Complete -  ?Palliative Care Screening - Complete -  ?[place] - Not  Applicable -  ?Some recent data might be hidden  ? ? ? ?

## 2022-02-17 DIAGNOSIS — N049 Nephrotic syndrome with unspecified morphologic changes: Secondary | ICD-10-CM | POA: Diagnosis not present

## 2022-02-17 LAB — RENAL FUNCTION PANEL
Albumin: 2.7 g/dL — ABNORMAL LOW (ref 3.5–5.0)
Anion gap: 10 (ref 5–15)
BUN: 49 mg/dL — ABNORMAL HIGH (ref 8–23)
CO2: 29 mmol/L (ref 22–32)
Calcium: 8.4 mg/dL — ABNORMAL LOW (ref 8.9–10.3)
Chloride: 96 mmol/L — ABNORMAL LOW (ref 98–111)
Creatinine, Ser: 5.03 mg/dL — ABNORMAL HIGH (ref 0.44–1.00)
GFR, Estimated: 9 mL/min — ABNORMAL LOW (ref >60)
Glucose, Bld: 117 mg/dL — ABNORMAL HIGH (ref 70–99)
Phosphorus: 5.6 mg/dL — ABNORMAL HIGH (ref 2.5–4.6)
Potassium: 4.2 mmol/L (ref 3.5–5.1)
Sodium: 135 mmol/L (ref 135–145)

## 2022-02-17 LAB — CBC
HCT: 24.8 % — ABNORMAL LOW (ref 36.0–46.0)
Hemoglobin: 8 g/dL — ABNORMAL LOW (ref 12.0–15.0)
MCH: 28.7 pg (ref 26.0–34.0)
MCHC: 32.3 g/dL (ref 30.0–36.0)
MCV: 88.9 fL (ref 80.0–100.0)
Platelets: 284 10*3/uL (ref 150–400)
RBC: 2.79 MIL/uL — ABNORMAL LOW (ref 3.87–5.11)
RDW: 13.1 % (ref 11.5–15.5)
WBC: 11 10*3/uL — ABNORMAL HIGH (ref 4.0–10.5)
nRBC: 0 % (ref 0.0–0.2)

## 2022-02-17 LAB — GLUCOSE, CAPILLARY
Glucose-Capillary: 82 mg/dL (ref 70–99)
Glucose-Capillary: 104 mg/dL — ABNORMAL HIGH (ref 70–99)
Glucose-Capillary: 224 mg/dL — ABNORMAL HIGH (ref 70–99)
Glucose-Capillary: 158 mg/dL — ABNORMAL HIGH (ref 70–99)

## 2022-02-17 LAB — PARATHYROID HORMONE, INTACT (NO CA): PTH: 113 pg/mL — ABNORMAL HIGH (ref 15–65)

## 2022-02-17 LAB — HEPATITIS B SURFACE ANTIBODY, QUANTITATIVE
Hep B S AB Quant (Post): <3.1 m[IU]/mL — ABNORMAL LOW (ref >9.9)
Hep B S AB Quant (Post): <3.1 m[IU]/mL — ABNORMAL LOW (ref >9.9)

## 2022-02-17 MED ORDER — HEPARIN SODIUM (PORCINE) 1000 UNIT/ML IJ SOLN
INTRAMUSCULAR | Status: AC
  Administered 2022-02-17: 3200 [IU] via INTRAVENOUS
  Filled 2022-02-17: qty 4

## 2022-02-17 MED ORDER — HEPARIN SODIUM (PORCINE) 1000 UNIT/ML IJ SOLN
1000.0000 [IU] | INTRAMUSCULAR | Status: DC | PRN
  Filled 2022-02-17: qty 1

## 2022-02-17 NOTE — Progress Notes (Addendum)
Rn noticed HD catheter was bleeding and disc saturated. RN STAT messaged IV team to come and assess and informed charge RN. IV team is here at this time changing dressing. RN will continue to monitor pt.  ? ?Nancy [name] ? ?

## 2022-02-17 NOTE — Procedures (Signed)
I was present at this dialysis session. I have reviewed the session itself and made appropriate changes.  ? ?CLIP to [facility] MWF ?FOr AVF/G 3/6 ? ?Seen on HD, goal UF 2.5L, 2K,  TDC, QB 250 ? ?K 4.2, Hb 8.0; ESA today.  ? ?Plan to keep through access procedure on Monday. RS ? ?Filed Weights  ? 02/16/22 0500 02/17/22 0525 02/17/22 0815  ?Weight: 98.6 kg 95.1 kg 95 kg  ? ? ?Recent Labs  ?Lab 02/17/22 ?7035  ?NA 135  ?K 4.2  ?CL 96*  ?CO2 29  ?GLUCOSE 117*  ?BUN 49*  ?CREATININE 5.03*  ?CALCIUM 8.4*  ?PHOS 5.6*  ? ? ?Recent Labs  ?Lab 02/14/22 ?1500 02/15/22 ?0093 02/15/22 ?1944 02/16/22 ?0122 02/17/22 ?8182  ?WBC 9.6   < > 9.2 9.4 11.0*  ?NEUTROABS 6.6  --   --   --   --   ?HGB 8.8*   < > 10.6* 8.6* 8.0*  ?HCT 27.0*   < > 32.0* 26.1* 24.8*  ?MCV 88.5   < > 87.4 88.5 88.9  ?PLT 320   < > 314 297 284  ? < > = values in this interval not displayed.  ? ? ?Scheduled Meds: ? amLODipine  10 mg Oral Daily  ? aspirin EC  81 mg Oral Daily  ? atorvastatin  40 mg Oral Daily  ? calcium acetate  667 mg Oral TID WC  ? carvedilol  18.75 mg Oral BID WC  ? cephALEXin  500 mg Oral Q12H  ? Chlorhexidine Gluconate Cloth  6 each Topical Q0600  ? collagenase   Topical BID  ? darbepoetin (ARANESP) injection - DIALYSIS  40 mcg Intravenous Q Sat-HD  ? furosemide  60 mg Intravenous Once  ? heparin  5,000 Units Subcutaneous Q8H  ? heparin sodium (porcine)      ? insulin aspart  0-15 Units Subcutaneous TID WC  ? insulin aspart  0-5 Units Subcutaneous QHS  ? insulin glargine-yfgn  15 Units Subcutaneous QHS  ? risperiDONE  3 mg Oral QHS  ? ?Continuous Infusions: ? ferric gluconate (FERRLECIT) IVPB Stopped (02/15/22 2143)  ? ?PRN Meds:.acetaminophen, cloNIDine, heparin sodium (porcine), mineral oil-hydrophilic petrolatum, ondansetron **OR** ondansetron (ZOFRAN) IV   ?Pearson Grippe  MD ?02/17/2022, 10:20 AM ?  ?

## 2022-02-17 NOTE — Progress Notes (Signed)
PROGRESS NOTE    Nancy [name]  CBJ:[id number] DOB: [birth year] DOA: 02/14/2022 PCP: [name], MD   Brief Narrative:  Nancy [name] is a 61 y.o. female with medical history significant of CKD stage V, hypertension, type 2 diabetes mellitus, CAD, chronic anemia, depression and many other comorbidities  presented to ED with a complaint of bilateral leg swelling despite of being able to make urine. She has also noticed to have some exertional shortness of breath but no shortness of breath at rest. patient had graft placement in right arm on 12/22/2021 however that failed to mature.  She was admitted for the need of new hemodialysis.  Assessment & Plan:   Principal Problem:   Renal anasarca Active Problems:   Acute on chronic diastolic CHF (congestive heart failure) (HCC)   ESRD (end stage renal disease) (HCC)   Hyperkalemia  Renal anasarca/progressively worsening CKD stage V, now ESRD: TDC was placed by IR on 01/18/2022 and she underwent her first dialysis as well.  Nephrology on board.  Next [employer].  She will need clear process for outpatient hemodialysis.  Her right upper extremity BC AVF failed to mature.  She was seen by vascular surgery.  Plan for upper extremity AVG Monday.  She received another dialysis today.    Acute on chronic diastolic congestive heart failure: Has history of grade 2 diastolic dysfunction in the past.  Echo done only 5 months ago.  Chest x-ray clear but bilateral lower extremity edema which is improving with dialysis.  Lasix stopped by nephrology now that she is on HD.   Hyperkalemia: Resolved.   Type 2 diabetes mellitus: She appears to be taking Lantus 30 units.  However she is on 15 units here and her blood sugar is controlled with slight hypoglycemia this morning.  Continue SSI.   Essential hypertension: Controlled.  Continue home medications.   Morbid obesity: Weight loss counseling provided.   HLD: Continue atorvastatin.    History of CAD: Asymptomatic.  Continue aspirin and Coreg.  Unstageable bilateral heel pressure wounds: Wound care on board.  DVT prophylaxis: heparin injection 5,000 Units Start: 02/14/22 2200   Code Status: Full Code  Family Communication:  None present at bedside.  Plan of care discussed with patient in length and he/she verbalized understanding and agreed with it.  Status is: Inpatient Remains inpatient appropriate because: Needs TDC in dialysis  Estimated body mass index is 36.08 kg/m as calculated from the following:   Height as of 12/22/21: '5\' 3"'$  (1.6 m).   Weight as of this encounter: 92.4 kg.  Pressure Injury 02/15/22 Heel Left Unstageable - Full thickness tissue loss in which the base of the injury is covered by slough (yellow, tan, gray, green or brown) and/or eschar (tan, brown or black) in the wound bed. (Active)  02/15/22 1856  Location: Heel  Location Orientation: Left  Staging: Unstageable - Full thickness tissue loss in which the base of the injury is covered by slough (yellow, tan, gray, green or brown) and/or eschar (tan, brown or black) in the wound bed.  Wound Description (Comments):   Present on Admission: Yes   Nutritional Assessment: Body mass index is 36.08 kg/m.Marland Kitchen Seen by dietician.  I agree with the assessment and plan as outlined below: Nutrition Status:        . Skin Assessment: I have examined the patient's skin and I agree with the wound assessment as performed by the wound care RN as outlined below: Pressure Injury 02/15/22 Heel Left  Unstageable - Full thickness tissue loss in which the base of the injury is covered by slough (yellow, tan, gray, green or brown) and/or eschar (tan, brown or black) in the wound bed. (Active)  02/15/22 1856  Location: Heel  Location Orientation: Left  Staging: Unstageable - Full thickness tissue loss in which the base of the injury is covered by slough (yellow, tan, gray, green or brown) and/or eschar (tan, brown or  black) in the wound bed.  Wound Description (Comments):   Present on Admission: Yes    Consultants:  Nephrology IR  Procedures:  None  Antimicrobials:  Anti-infectives (From admission, onward)    Start     Dose/Rate Route Frequency Ordered Stop   02/15/22 1300  ceFAZolin (ANCEF) IVPB 2g/100 mL premix        2 g 200 mL/hr over 30 Minutes Intravenous To Radiology 02/15/22 1224 02/15/22 1416   02/15/22 0800  ceFAZolin (ANCEF) IVPB 1 g/50 mL premix  Status:  Discontinued        1 g 100 mL/hr over 30 Minutes Intravenous To Radiology 02/15/22 0746 02/15/22 1224   02/14/22 2200  cephALEXin (KEFLEX) capsule 500 mg        500 mg Oral Every 12 hours 02/14/22 1747           Subjective:  Seen and examined in dialysis unit.  She has no complaints.  Objective: Vitals:   02/17/22 1030 02/17/22 1100 02/17/22 1115 02/17/22 1126  BP: (!) 126/59 (!) 97/53 (!) 102/58 127/65  Pulse: 67 66 70 75  Resp: '14 13 14 15  '$ Temp:    (!) 97 F (36.1 C)  TempSrc:    Oral  SpO2:    98%  Weight:    92.4 kg    Intake/Output Summary (Last 24 hours) at 02/17/2022 1338 Last data filed at 02/17/2022 1126 Gross per 24 hour  Intake 530 ml  Output 3275 ml  Net -2745 ml    Filed Weights   02/17/22 0525 02/17/22 0815 02/17/22 1126  Weight: 95.1 kg 95 kg 92.4 kg    Examination:  General exam: Appears calm and comfortable, obese Respiratory system: Clear to auscultation. Respiratory effort normal. Cardiovascular system: S1 & S2 heard, RRR. No JVD, murmurs, rubs, gallops or clicks.  +2 pitting edema bilateral lower extremity Gastrointestinal system: Abdomen is nondistended, soft and nontender. No organomegaly or masses felt. Normal bowel sounds heard. Central nervous system: Alert and oriented. No focal neurological deficits. Extremities: Symmetric 5 x 5 power. Skin: No rashes, lesions or ulcers.  Psychiatry: Judgement and insight appear normal. Mood & affect appropriate.    Data Reviewed: I have  personally reviewed following labs and imaging studies  CBC: Recent Labs  Lab 02/14/22 1500 02/15/22 0838 02/15/22 0846 02/15/22 1944 02/16/22 0122 02/17/22 0749  WBC 9.6  --  QUESTIONABLE RESULTS, RECOMMEND RECOLLECT TO VERIFY 9.2 9.4 11.0*  NEUTROABS 6.6  --   --   --   --   --   HGB 8.8* 9.9* QUESTIONABLE RESULTS, RECOMMEND RECOLLECT TO VERIFY 10.6* 8.6* 8.0*  HCT 27.0* 29.0* QUESTIONABLE RESULTS, RECOMMEND RECOLLECT TO VERIFY 32.0* 26.1* 24.8*  MCV 88.5  --  QUESTIONABLE RESULTS, RECOMMEND RECOLLECT TO VERIFY 87.4 88.5 88.9  PLT 320  --  QUESTIONABLE RESULTS, RECOMMEND RECOLLECT TO VERIFY [phone]    Basic Metabolic Panel: Recent Labs  Lab 02/14/22 1500 02/14/22 1639 02/15/22 0838 02/15/22 1944 02/17/22 0749  NA 131*  --  134* 134* 135  K 6.0*  --  4.8 4.5 4.2  CL 95*  --  102 98 96*  CO2 26  --   --  21* 29  GLUCOSE 97  --  53* 127* 117*  BUN 72*  --  67* 72* 49*  CREATININE 6.03* 6.20* 7.30* 6.17* 5.03*  CALCIUM 8.7*  --   --  9.0 8.4*  MG  --  2.7*  --   --   --   PHOS  --   --   --  6.5* 5.6*    GFR: Estimated Creatinine Clearance: 12.7 mL/min (A) (by C-G formula based on SCr of 5.03 mg/dL (H)). Liver Function Tests: Recent Labs  Lab 02/14/22 1639 02/15/22 1944 02/17/22 0749  AST 13* 20  --   ALT 9 15  --   ALKPHOS 46 61  --   BILITOT 0.6 0.3  --   PROT 7.7 7.8  --   ALBUMIN 3.2* 3.2* 2.7*    No results for input(s): LIPASE, AMYLASE in the last 168 hours. No results for input(s): AMMONIA in the last 168 hours. Coagulation Profile: No results for input(s): INR, PROTIME in the last 168 hours. Cardiac Enzymes: No results for input(s): CKTOTAL, CKMB, CKMBINDEX, TROPONINI in the last 168 hours. BNP (last 3 results) No results for input(s): PROBNP in the last 8760 hours. HbA1C: Recent Labs    02/16/22 0123  HGBA1C 6.3*    CBG: Recent Labs  Lab 02/16/22 1126 02/16/22 1620 02/16/22 2034 02/17/22 0726 02/17/22 1202  GLUCAP 121* 103* 232*  82 104*    Lipid Profile: No results for input(s): CHOL, HDL, LDLCALC, TRIG, CHOLHDL, LDLDIRECT in the last 72 hours. Thyroid Function Tests: No results for input(s): TSH, T4TOTAL, FREET4, T3FREE, THYROIDAB in the last 72 hours. Anemia Panel: Recent Labs    02/15/22 0846 02/15/22 1944  FERRITIN QUESTIONABLE RESULTS, RECOMMEND RECOLLECT TO VERIFY 254  TIBC QUESTIONABLE RESULTS, RECOMMEND RECOLLECT TO VERIFY 227*  IRON QUESTIONABLE RESULTS, RECOMMEND RECOLLECT TO VERIFY 52    Sepsis Labs: No results for input(s): PROCALCITON, LATICACIDVEN in the last 168 hours.  Recent Results (from the past 240 hour(s))  Resp Panel by RT-PCR (Flu A&B, Covid) Nasopharyngeal Swab     Status: None   Collection Time: 02/14/22  4:40 PM   Specimen: Nasopharyngeal Swab; Nasopharyngeal(NP) swabs in vial transport medium  Result Value Ref Range Status   SARS Coronavirus 2 by RT PCR NEGATIVE NEGATIVE Final    Comment: (NOTE) SARS-CoV-2 target nucleic acids are NOT DETECTED.  The SARS-CoV-2 RNA is generally detectable in upper respiratory specimens during the acute phase of infection. The lowest concentration of SARS-CoV-2 viral copies this assay can detect is 138 copies/mL. A negative result does not preclude SARS-Cov-2 infection and should not be used as the sole basis for treatment or other patient management decisions. A negative result may occur with  improper specimen collection/handling, submission of specimen other than nasopharyngeal swab, presence of viral mutation(s) within the areas targeted by this assay, and inadequate number of viral copies(<138 copies/mL). A negative result must be combined with clinical observations, patient history, and epidemiological information. The expected result is Negative.  Fact Sheet for Patients:  EntrepreneurPulse.com.au  Fact Sheet for Healthcare Providers:  IncredibleEmployment.be  This test is no t yet approved or  cleared by the [place] FDA and  has been authorized for detection and/or diagnosis of SARS-CoV-2 by FDA under an Emergency Use Authorization (EUA). This EUA will remain  in effect (meaning this test can be used) for the  duration of the COVID-19 declaration under Section 564(b)(1) of the Act, 21 U.S.C.section 360bbb-3(b)(1), unless the authorization is terminated  or revoked sooner.       Influenza A by PCR NEGATIVE NEGATIVE Final   Influenza B by PCR NEGATIVE NEGATIVE Final    Comment: (NOTE) The Xpert Xpress SARS-CoV-2/FLU/RSV plus assay is intended as an aid in the diagnosis of influenza from Nasopharyngeal swab specimens and should not be used as a sole basis for treatment. Nasal washings and aspirates are unacceptable for Xpert Xpress SARS-CoV-2/FLU/RSV testing.  Fact Sheet for Patients: EntrepreneurPulse.com.au  Fact Sheet for Healthcare Providers: IncredibleEmployment.be  This test is not yet approved or cleared by the [place] FDA and has been authorized for detection and/or diagnosis of SARS-CoV-2 by FDA under an Emergency Use Authorization (EUA). This EUA will remain in effect (meaning this test can be used) for the duration of the COVID-19 declaration under Section 564(b)(1) of the Act, 21 U.S.C. section 360bbb-3(b)(1), unless the authorization is terminated or revoked.  Performed at [facility], [place] [address] [place], [place] [address]       Radiology Studies: IR Fluoro Guide CV Line Right  Result Date: 02/15/2022 INDICATION: Renal failure EXAM: TUNNELED CENTRAL VENOUS HEMODIALYSIS CATHETER PLACEMENT WITH ULTRASOUND AND FLUOROSCOPIC GUIDANCE MEDICATIONS: Ancef 2 g IV. The antibiotic was given in an appropriate time interval prior to skin puncture. ANESTHESIA/SEDATION: Moderate (conscious) sedation was employed during this procedure. A total of Versed 1 mg and Fentanyl 50 mcg was administered intravenously.  Moderate Sedation Time: 18 minutes. The patient's level of consciousness and vital signs were monitored continuously by radiology nursing throughout the procedure under my direct supervision. FLUOROSCOPY TIME:  Fluoroscopy Time: 0 minutes 12 seconds (1 mGy). COMPLICATIONS: None immediate. PROCEDURE: Informed written consent was obtained from the patient after a discussion of the risks, benefits, and alternatives to treatment. Questions regarding the procedure were encouraged and answered. The right neck and chest were prepped with chlorhexidine in a sterile fashion, and a sterile drape was applied covering the operative field. Maximum barrier sterile technique with sterile gowns and gloves were used for the procedure. A timeout was performed prior to the initiation of the procedure. The right internal jugular vein was evaluated with ultrasound and shown to be patent. A permanent ultrasound image was obtained and placed in the patient's medical record. Using sterile gel and a sterile probe cover, the right internal jugular vein was entered with a 21 ga needle during real time ultrasound guidance. 0.018 inch guidewire placed and 21 ga needle exchanged for transitional dilator set. Utilizing fluoroscopy, 0.035 inch guidewire advanced through the needle without difficulty. Seriel dilation was performed and peel-away sheath was placed. Attention then turned to the right anterior upper chest. Following local lidocaine administration, the hemodialysis catheter was tunneled from the chest wall to the venotomy site. The catheter was inserted through the peel-away sheath. The tip of the catheter was positioned within the right atrium using fluoroscopic guidance. All lumens of the catheter aspirated and flushed well. The dialysis lumens were locked with Heparin. The catheter was secured to the skin with suture. The insertion site was covered with sterile dressing. IMPRESSION: Successful placement of 19 cm tip to cuff tunneled  hemodialysis catheter via the right internal jugular vein with tips terminating within the superior aspect of the right atrium. The catheter is ready for immediate use. Electronically Signed   By: Miachel Roux M.D.   On: 02/15/2022 15:59   IR US Guide Vasc  Access Right  Result Date: 02/15/2022 INDICATION: Renal failure EXAM: TUNNELED CENTRAL VENOUS HEMODIALYSIS CATHETER PLACEMENT WITH ULTRASOUND AND FLUOROSCOPIC GUIDANCE MEDICATIONS: Ancef 2 g IV. The antibiotic was given in an appropriate time interval prior to skin puncture. ANESTHESIA/SEDATION: Moderate (conscious) sedation was employed during this procedure. A total of Versed 1 mg and Fentanyl 50 mcg was administered intravenously. Moderate Sedation Time: 18 minutes. The patient's level of consciousness and vital signs were monitored continuously by radiology nursing throughout the procedure under my direct supervision. FLUOROSCOPY TIME:  Fluoroscopy Time: 0 minutes 12 seconds (1 mGy). COMPLICATIONS: None immediate. PROCEDURE: Informed written consent was obtained from the patient after a discussion of the risks, benefits, and alternatives to treatment. Questions regarding the procedure were encouraged and answered. The right neck and chest were prepped with chlorhexidine in a sterile fashion, and a sterile drape was applied covering the operative field. Maximum barrier sterile technique with sterile gowns and gloves were used for the procedure. A timeout was performed prior to the initiation of the procedure. The right internal jugular vein was evaluated with ultrasound and shown to be patent. A permanent ultrasound image was obtained and placed in the patient's medical record. Using sterile gel and a sterile probe cover, the right internal jugular vein was entered with a 21 ga needle during real time ultrasound guidance. 0.018 inch guidewire placed and 21 ga needle exchanged for transitional dilator set. Utilizing fluoroscopy, 0.035 inch guidewire advanced  through the needle without difficulty. Seriel dilation was performed and peel-away sheath was placed. Attention then turned to the right anterior upper chest. Following local lidocaine administration, the hemodialysis catheter was tunneled from the chest wall to the venotomy site. The catheter was inserted through the peel-away sheath. The tip of the catheter was positioned within the right atrium using fluoroscopic guidance. All lumens of the catheter aspirated and flushed well. The dialysis lumens were locked with Heparin. The catheter was secured to the skin with suture. The insertion site was covered with sterile dressing. IMPRESSION: Successful placement of 19 cm tip to cuff tunneled hemodialysis catheter via the right internal jugular vein with tips terminating within the superior aspect of the right atrium. The catheter is ready for immediate use. Electronically Signed   By: Miachel Roux M.D.   On: 02/15/2022 15:59    Scheduled Meds:  amLODipine  10 mg Oral Daily   aspirin EC  81 mg Oral Daily   atorvastatin  40 mg Oral Daily   calcium acetate  667 mg Oral TID WC   carvedilol  18.75 mg Oral BID WC   cephALEXin  500 mg Oral Q12H   Chlorhexidine Gluconate Cloth  6 each Topical Q0600   collagenase   Topical BID   darbepoetin (ARANESP) injection - DIALYSIS  40 mcg Intravenous Q Sat-HD   furosemide  60 mg Intravenous Once   heparin  5,000 Units Subcutaneous Q8H   insulin aspart  0-15 Units Subcutaneous TID WC   insulin aspart  0-5 Units Subcutaneous QHS   insulin glargine-yfgn  15 Units Subcutaneous QHS   risperiDONE  3 mg Oral QHS   Continuous Infusions:  ferric gluconate (FERRLECIT) IVPB 125 mg (02/17/22 1021)     LOS: 3 days   Time spent: 25 minutes  Darliss Cheney, MD [facility]  02/17/2022, 1:38 PM  Please page via [facility] and do not message via secure chat for urgent patient care matters. Secure chat can be used for non urgent patient care matters.  How to contact  the [facility]  Attending or Consulting provider [place] or covering provider during after hours [place], for this patient?  Check the care team in [facility] and look for a) attending/consulting [facility] provider listed and b) the [facility] team listed. Page or secure chat [phone]. Log into [email or web address] and use [place]'s universal password to access. If you do not have the password, please contact the hospital operator. Locate the [facility] provider you are looking for under [facility] and page to a number that you can be directly reached. If you still have difficulty reaching the provider, please page the [facility] (Director on Call) for the Hospitalists listed on amion for assistance.

## 2022-02-18 DIAGNOSIS — N049 Nephrotic syndrome with unspecified morphologic changes: Secondary | ICD-10-CM | POA: Diagnosis not present

## 2022-02-18 LAB — GLUCOSE, CAPILLARY
Glucose-Capillary: 115 mg/dL — ABNORMAL HIGH (ref 70–99)
Glucose-Capillary: 165 mg/dL — ABNORMAL HIGH (ref 70–99)
Glucose-Capillary: 175 mg/dL — ABNORMAL HIGH (ref 70–99)

## 2022-02-18 NOTE — Progress Notes (Signed)
Admit: 02/14/2022 ?LOS: 4 ? ?15F New ESRD ? ?Subjective:  ?HD yesterday, uneventful, 2.5L UF ?CLIP to [facility] MWF ?AVF/G tomorrow with VVS ?Pt w/o c/o ?BPs stable ? ?Scheduled Meds: ? amLODipine  10 mg Oral Daily  ? aspirin EC  81 mg Oral Daily  ? atorvastatin  40 mg Oral Daily  ? calcium acetate  667 mg Oral TID WC  ? carvedilol  18.75 mg Oral BID WC  ? cephALEXin  500 mg Oral Q12H  ? Chlorhexidine Gluconate Cloth  6 each Topical Q0600  ? collagenase   Topical BID  ? darbepoetin (ARANESP) injection - DIALYSIS  40 mcg Intravenous Q Sat-HD  ? furosemide  60 mg Intravenous Once  ? heparin  5,000 Units Subcutaneous Q8H  ? insulin aspart  0-15 Units Subcutaneous TID WC  ? insulin aspart  0-5 Units Subcutaneous QHS  ? insulin glargine-yfgn  15 Units Subcutaneous QHS  ? risperiDONE  3 mg Oral QHS  ? ?Continuous Infusions: ? ferric gluconate (FERRLECIT) IVPB 125 mg (02/17/22 1021)  ? ?PRN Meds:.acetaminophen, cloNIDine, heparin sodium (porcine), mineral oil-hydrophilic petrolatum, ondansetron **OR** ondansetron (ZOFRAN) IV ? ?Current Labs: reviewed  ? Latest Reference Range & Units 02/15/22 08:46  ?Saturation Ratios 10.4 - 31.8 % 16  ?Ferritin 11 - 307 ng/mL 137  ? ? Latest Reference Range & Units 02/15/22 19:44  ?PTH, Intact 15 - 65 pg/mL 113 (H)  ?(H): Data is abnormally high ?Physical Exam:  Blood pressure 123/66, pulse 72, temperature 98.9 ?F (37.2 ?C), resp. rate 18, weight 92.4 kg, SpO2 94 %. ?Chronically ill appearing, NAD ?RRR ?CTAB but diminished in bases ?S/nt/nd ?1+ LEE, pitting ?RUE AVF NO B/T ?Nonfocal ? 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 ?P ?As above ?Medication Issues; ?Preferred narcotic agents for pain control are hydromorphone, fentanyl, and methadone. Morphine should not be used.  ?Baclofen should be avoided ?Avoid oral sodium phosphate and magnesium citrate based laxatives / bowel preps  ? ? ?RPearson GrippeMD ?02/18/2022, 10:03 AM ? ?Recent Labs  ?Lab 02/14/22 ?1500 02/14/22 ?1639 02/15/22 ?[id number]/23 ?1944 02/17/22 ?0749  ?NA 131*  --  134* 134* 135  ?K 6.0*  --  4.8 4.5 4.2  ?CL 95*  --  102 98 96*  ?CO2 26  --   --  21* 29  ?GLUCOSE 97  --  53* 127* 117*  ?BUN 72*  --  67* 72* 49*  ?CREATININE 6.03*   < > 7.30* 6.17* 5.03*  ?CALCIUM 8.7*  --   --  9.0 8.4*  ?PHOS  --   --   --  6.5* 5.6*  ? < > = values in this interval not displayed.  ? ? ?Recent Labs  ?Lab 02/14/22 ?1500 02/15/22 ?[id number]/23 ?1944 02/16/22 ?0122 02/17/22 ?02025 ?WBC 9.6   < > 9.2 9.4 11.0*  ?NEUTROABS 6.6  --   --   --   --   ?HGB 8.8*   < > 10.6* 8.6* 8.0*  ?  HCT 27.0*   < > 32.0* 26.1* 24.8*  ?MCV 88.5   < > 87.4 88.5 88.9  ?PLT 320   < > 314 297 284  ? < > = values in this interval not displayed.  ? ? ? ? ? ? ? ? ? ? ?  ?

## 2022-02-18 NOTE — Progress Notes (Signed)
PROGRESS NOTE    Nancy [name]  ZSW:[id number] DOB: December 25, 1960 DOA: 02/14/2022 PCP: [name], MD   Brief Narrative:  Nancy [name] is a 61 y.o. female with medical history significant of CKD stage V, hypertension, type 2 diabetes mellitus, CAD, chronic anemia, depression and many other comorbidities  presented to ED with a complaint of bilateral leg swelling despite of being able to make urine. She has also noticed to have some exertional shortness of breath but no shortness of breath at rest. patient had graft placement in right arm on 12/22/2021 however that failed to mature.  She was admitted for the need of new hemodialysis.  Assessment & Plan:   Principal Problem:   Renal anasarca Active Problems:   Acute on chronic diastolic CHF (congestive heart failure) (HCC)   ESRD (end stage renal disease) (HCC)   Hyperkalemia  Renal anasarca/progressively worsening CKD stage V, now ESRD: TDC was placed by IR on 01/18/2022 and she underwent her first dialysis as well.  Her right upper extremity BC AVF failed to mature.  She was seen by vascular surgery.  Plan for upper extremity AVG Monday.  Per nephrology note, CLIP complete [facility] MWF 2nd shift. Plan for HD#3 3/6 (to stay on schedule) or 3/7, if 3/6 must go for AVF/G first  Acute on chronic diastolic congestive heart failure: Volume managed by HD   Hyperkalemia: Resolved.   Type 2 diabetes mellitus: She appears to be taking Lantus 30 units.  However she is on 15 units here and her blood sugar is controlled with slight hypoglycemia this morning.  Continue SSI.   Essential hypertension: Controlled.  Continue home medications.   Morbid obesity: Weight loss counseling provided.   HLD: Continue atorvastatin.   History of CAD: Asymptomatic.  Continue aspirin and Coreg.  Unstageable bilateral heel pressure wounds: Wound care on board.  DVT prophylaxis: heparin injection 5,000 Units Start: 02/14/22 2200   Code Status: Full Code   Family Communication:  None present at bedside.  Plan of care discussed with patient in length and he/she verbalized understanding and agreed with it.  Status is: Inpatient Remains inpatient appropriate because: Needs TDC in dialysis  Estimated body mass index is 36.08 kg/m as calculated from the following:   Height as of 12/22/21: '5\' 3"'$  (1.6 m).   Weight as of this encounter: 92.4 kg.  Pressure Injury 02/15/22 Heel Left Unstageable - Full thickness tissue loss in which the base of the injury is covered by slough (yellow, tan, gray, green or brown) and/or eschar (tan, brown or black) in the wound bed. (Active)  02/15/22 1856  Location: Heel  Location Orientation: Left  Staging: Unstageable - Full thickness tissue loss in which the base of the injury is covered by slough (yellow, tan, gray, green or brown) and/or eschar (tan, brown or black) in the wound bed.  Wound Description (Comments):   Present on Admission: Yes   Nutritional Assessment: Body mass index is 36.08 kg/m.Marland Kitchen Seen by dietician.  I agree with the assessment and plan as outlined below: Nutrition Status:        . Skin Assessment: I have examined the patient's skin and I agree with the wound assessment as performed by the wound care RN as outlined below: Pressure Injury 02/15/22 Heel Left Unstageable - Full thickness tissue loss in which the base of the injury is covered by slough (yellow, tan, gray, green or brown) and/or eschar (tan, brown or black) in the wound bed. (Active)  02/15/22  1856  Location: Heel  Location Orientation: Left  Staging: Unstageable - Full thickness tissue loss in which the base of the injury is covered by slough (yellow, tan, gray, green or brown) and/or eschar (tan, brown or black) in the wound bed.  Wound Description (Comments):   Present on Admission: Yes    Consultants:  Nephrology IR  Procedures:  As above  Antimicrobials:  Anti-infectives (From admission, onward)    Start      Dose/Rate Route Frequency Ordered Stop   02/15/22 1300  ceFAZolin (ANCEF) IVPB 2g/100 mL premix        2 g 200 mL/hr over 30 Minutes Intravenous To Radiology 02/15/22 1224 02/15/22 1416   02/15/22 0800  ceFAZolin (ANCEF) IVPB 1 g/50 mL premix  Status:  Discontinued        1 g 100 mL/hr over 30 Minutes Intravenous To Radiology 02/15/22 0746 02/15/22 1224   02/14/22 2200  cephALEXin (KEFLEX) capsule 500 mg        500 mg Oral Every 12 hours 02/14/22 1747           Subjective:  Seen and examined.  She has no complaints.  Objective: Vitals:   02/17/22 1431 02/17/22 1646 02/17/22 2024 02/18/22 0843  BP: 120/61 (!) 130/57 131/61 123/66  Pulse: 84 74 72 72  Resp: '18 18 18 18  '$ Temp: 99.1 F (37.3 C) 98.2 F (36.8 C) 98.6 F (37 C) 98.9 F (37.2 C)  TempSrc: Oral  Oral   SpO2: 100% 94% 96% 94%  Weight:        Intake/Output Summary (Last 24 hours) at 02/18/2022 1031 Last data filed at 02/18/2022 0700 Gross per 24 hour  Intake 340 ml  Output 3275 ml  Net -2935 ml    Filed Weights   02/17/22 0525 02/17/22 0815 02/17/22 1126  Weight: 95.1 kg 95 kg 92.4 kg    Examination:  General exam: Appears calm and comfortable, obese Respiratory system: Clear to auscultation. Respiratory effort normal. Cardiovascular system: S1 & S2 heard, RRR. No JVD, murmurs, rubs, gallops or clicks.  +1 pitting edema bilateral lower extremity Gastrointestinal system: Abdomen is nondistended, soft and nontender. No organomegaly or masses felt. Normal bowel sounds heard. Central nervous system: Alert and oriented. No focal neurological deficits. Extremities: Symmetric 5 x 5 power. Skin: No rashes, lesions or ulcers.  Psychiatry: Judgement and insight appear poor   Data Reviewed: I have personally reviewed following labs and imaging studies  CBC: Recent Labs  Lab 02/14/22 1500 02/15/22 0838 02/15/22 0846 02/15/22 1944 02/16/22 0122 02/17/22 0749  WBC 9.6  --  QUESTIONABLE RESULTS, RECOMMEND  RECOLLECT TO VERIFY 9.2 9.4 11.0*  NEUTROABS 6.6  --   --   --   --   --   HGB 8.8* 9.9* QUESTIONABLE RESULTS, RECOMMEND RECOLLECT TO VERIFY 10.6* 8.6* 8.0*  HCT 27.0* 29.0* QUESTIONABLE RESULTS, RECOMMEND RECOLLECT TO VERIFY 32.0* 26.1* 24.8*  MCV 88.5  --  QUESTIONABLE RESULTS, RECOMMEND RECOLLECT TO VERIFY 87.4 88.5 88.9  PLT 320  --  QUESTIONABLE RESULTS, RECOMMEND RECOLLECT TO VERIFY [phone]    Basic Metabolic Panel: Recent Labs  Lab 02/14/22 1500 02/14/22 1639 02/15/22 0838 02/15/22 1944 02/17/22 0749  NA 131*  --  134* 134* 135  K 6.0*  --  4.8 4.5 4.2  CL 95*  --  102 98 96*  CO2 26  --   --  21* 29  GLUCOSE 97  --  53* 127* 117*  BUN 72*  --  67* 72* 49*  CREATININE 6.03* 6.20* 7.30* 6.17* 5.03*  CALCIUM 8.7*  --   --  9.0 8.4*  MG  --  2.7*  --   --   --   PHOS  --   --   --  6.5* 5.6*    GFR: Estimated Creatinine Clearance: 12.7 mL/min (A) (by C-G formula based on SCr of 5.03 mg/dL (H)). Liver Function Tests: Recent Labs  Lab 02/14/22 1639 02/15/22 1944 02/17/22 0749  AST 13* 20  --   ALT 9 15  --   ALKPHOS 46 61  --   BILITOT 0.6 0.3  --   PROT 7.7 7.8  --   ALBUMIN 3.2* 3.2* 2.7*    No results for input(s): LIPASE, AMYLASE in the last 168 hours. No results for input(s): AMMONIA in the last 168 hours. Coagulation Profile: No results for input(s): INR, PROTIME in the last 168 hours. Cardiac Enzymes: No results for input(s): CKTOTAL, CKMB, CKMBINDEX, TROPONINI in the last 168 hours. BNP (last 3 results) No results for input(s): PROBNP in the last 8760 hours. HbA1C: Recent Labs    02/16/22 0123  HGBA1C 6.3*    CBG: Recent Labs  Lab 02/16/22 2034 02/17/22 0726 02/17/22 1202 02/17/22 1644 02/17/22 2025  GLUCAP 232* 82 104* 224* 158*    Lipid Profile: No results for input(s): CHOL, HDL, LDLCALC, TRIG, CHOLHDL, LDLDIRECT in the last 72 hours. Thyroid Function Tests: No results for input(s): TSH, T4TOTAL, FREET4, T3FREE, THYROIDAB in  the last 72 hours. Anemia Panel: Recent Labs    02/15/22 1944  FERRITIN 254  TIBC 227*  IRON 52    Sepsis Labs: No results for input(s): PROCALCITON, LATICACIDVEN in the last 168 hours.  Recent Results (from the past 240 hour(s))  Resp Panel by RT-PCR (Flu A&B, Covid) Nasopharyngeal Swab     Status: None   Collection Time: 02/14/22  4:40 PM   Specimen: Nasopharyngeal Swab; Nasopharyngeal(NP) swabs in vial transport medium  Result Value Ref Range Status   SARS Coronavirus 2 by RT PCR NEGATIVE NEGATIVE Final    Comment: (NOTE) SARS-CoV-2 target nucleic acids are NOT DETECTED.  The SARS-CoV-2 RNA is generally detectable in upper respiratory specimens during the acute phase of infection. The lowest concentration of SARS-CoV-2 viral copies this assay can detect is 138 copies/mL. A negative result does not preclude SARS-Cov-2 infection and should not be used as the sole basis for treatment or other patient management decisions. A negative result may occur with  improper specimen collection/handling, submission of specimen other than nasopharyngeal swab, presence of viral mutation(s) within the areas targeted by this assay, and inadequate number of viral copies(<138 copies/mL). A negative result must be combined with clinical observations, patient history, and epidemiological information. The expected result is Negative.  Fact Sheet for Patients:  EntrepreneurPulse.com.au  Fact Sheet for Healthcare Providers:  IncredibleEmployment.be  This test is no t yet approved or cleared by the [place] FDA and  has been authorized for detection and/or diagnosis of SARS-CoV-2 by FDA under an Emergency Use Authorization (EUA). This EUA will remain  in effect (meaning this test can be used) for the duration of the COVID-19 declaration under Section 564(b)(1) of the Act, 21 U.S.C.section 360bbb-3(b)(1), unless the authorization is terminated  or  revoked sooner.       Influenza A by PCR NEGATIVE NEGATIVE Final   Influenza B by PCR NEGATIVE NEGATIVE Final    Comment: (NOTE) The Xpert Xpress SARS-CoV-2/FLU/RSV plus assay is intended  as an aid in the diagnosis of influenza from Nasopharyngeal swab specimens and should not be used as a sole basis for treatment. Nasal washings and aspirates are unacceptable for Xpert Xpress SARS-CoV-2/FLU/RSV testing.  Fact Sheet for Patients: EntrepreneurPulse.com.au  Fact Sheet for Healthcare Providers: IncredibleEmployment.be  This test is not yet approved or cleared by the [place] FDA and has been authorized for detection and/or diagnosis of SARS-CoV-2 by FDA under an Emergency Use Authorization (EUA). This EUA will remain in effect (meaning this test can be used) for the duration of the COVID-19 declaration under Section 564(b)(1) of the Act, 21 U.S.C. section 360bbb-3(b)(1), unless the authorization is terminated or revoked.  Performed at [facility], [place] [address], [place], [place] [address]       Radiology Studies: No results found.  Scheduled Meds:  amLODipine  10 mg Oral Daily   aspirin EC  81 mg Oral Daily   atorvastatin  40 mg Oral Daily   calcium acetate  667 mg Oral TID WC   carvedilol  18.75 mg Oral BID WC   cephALEXin  500 mg Oral Q12H   Chlorhexidine Gluconate Cloth  6 each Topical Q0600   collagenase   Topical BID   darbepoetin (ARANESP) injection - DIALYSIS  40 mcg Intravenous Q Sat-HD   furosemide  60 mg Intravenous Once   heparin  5,000 Units Subcutaneous Q8H   insulin aspart  0-15 Units Subcutaneous TID WC   insulin aspart  0-5 Units Subcutaneous QHS   insulin glargine-yfgn  15 Units Subcutaneous QHS   risperiDONE  3 mg Oral QHS   Continuous Infusions:  ferric gluconate (FERRLECIT) IVPB 125 mg (02/17/22 1021)     LOS: 4 days   Time spent: 24 minutes  Darliss Cheney, MD [facility]  02/18/2022,  10:31 AM  Please page via [facility] and do not message via secure chat for urgent patient care matters. Secure chat can be used for non urgent patient care matters.  How to contact the [facility] Attending or Consulting provider [place] or covering provider during after hours [place], for this patient?  Check the care team in [facility] and look for a) attending/consulting [facility] provider listed and b) the [facility] team listed. Page or secure chat [phone]. Log into [email or web address] and use 's universal password to access. If you do not have the password, please contact the hospital operator. Locate the [facility] provider you are looking for under [facility] and page to a number that you can be directly reached. If you still have difficulty reaching the provider, please page the [facility] (Director on Call) for the Hospitalists listed on amion for assistance.

## 2022-02-18 NOTE — H&P (View-Only) (Signed)
? ?  VASCULAR SURGERY ASSESSMENT & PLAN:  ? ?END-STAGE RENAL DISEASE:  ? ?For right arm AV fistula versus graft creation tomorrow ?N.p.o. midnight ?She has a failed right brachiocephalic fistula which was placed in January.   ?Her options will either be a for stage basilic vein transposition or an AV graft in the right arm.  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 ? ?Please do not dialyze the patient on Monday as she is scheduled for surgery. ? ? ?SUBJECTIVE:  ? ? No complaints.  She just ate breakfast. ? ?PHYSICAL EXAM:  ? ?Vitals:  ? 02/17/22 1431 02/17/22 1646 02/17/22 2024 02/18/22 0843  ?BP: 120/61 (!) 130/57 131/61 123/66  ?Pulse: 84 74 72 72  ?Resp: '18 18 18 18  '$ ?Temp: 99.1 ?F (37.3 ?C) 98.2 ?F (36.8 ?C) 98.6 ?F (37 ?C) 98.9 ?F (37.2 ?C)  ?TempSrc: Oral  Oral   ?SpO2: 100% 94% 96% 94%  ?Weight:      ? ?IV in left arm. ?Palpable right radial pulse. ? ?LABS:  ? ?Lab Results  ?Component Value Date  ? WBC 11.0 (H) 02/17/2022  ? HGB 8.0 (L) 02/17/2022  ? HCT 24.8 (L) 02/17/2022  ? MCV 88.9 02/17/2022  ? PLT 284 02/17/2022  ? ?Lab Results  ?Component Value Date  ? CREATININE 5.03 (H) 02/17/2022  ? ?Lab Results  ?Component Value Date  ? INR 0.91 11/13/2017  ? ?CBG (last 3)  ?Recent Labs  ?  02/17/22 ?1202 02/17/22 ?1644 02/17/22 ?2025  ?GLUCAP 104* 224* 158*  ? ? ? ?PROBLEM LIST:   ? ?Principal Problem: ?  Renal anasarca ?Active Problems: ?  Acute on chronic diastolic CHF (congestive heart failure) ([place]) ?  ESRD (end stage renal disease) ([place]) ?  Hyperkalemia ? ? ?CURRENT MEDS:  ? ? amLODipine  10 mg Oral Daily  ? aspirin EC  81 mg Oral Daily  ? atorvastatin  40 mg Oral Daily  ? calcium acetate  667 mg Oral TID WC  ? carvedilol  18.75 mg Oral BID WC  ? cephALEXin  500 mg Oral Q12H  ? Chlorhexidine Gluconate Cloth  6 each Topical Q0600  ? collagenase   Topical BID  ? darbepoetin (ARANESP) injection - DIALYSIS  40 mcg Intravenous Q Sat-HD  ? furosemide  60 mg Intravenous Once  ? heparin   5,000 Units Subcutaneous Q8H  ? insulin aspart  0-15 Units Subcutaneous TID WC  ? insulin aspart  0-5 Units Subcutaneous QHS  ? insulin glargine-yfgn  15 Units Subcutaneous QHS  ? risperiDONE  3 mg Oral QHS  ? ? ?Broadus John ?Office: [phone] ?02/18/2022 ? ?

## 2022-02-18 NOTE — Progress Notes (Signed)
? ?  VASCULAR SURGERY ASSESSMENT & PLAN:  ? ?END-STAGE RENAL DISEASE:  ? ?For right arm AV fistula versus graft creation tomorrow ?N.p.o. midnight ?She has a failed right brachiocephalic fistula which was placed in January.   ?Her options will either be a for stage basilic vein transposition or an AV graft in the right arm.  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 ? ?Please do not dialyze the patient on Monday as she is scheduled for surgery. ? ? ?SUBJECTIVE:  ? ? No complaints.  She just ate breakfast. ? ?PHYSICAL EXAM:  ? ?Vitals:  ? 02/17/22 1431 02/17/22 1646 02/17/22 2024 02/18/22 0843  ?BP: 120/61 (!) 130/57 131/61 123/66  ?Pulse: 84 74 72 72  ?Resp: '18 18 18 18  '$ ?Temp: 99.1 ?F (37.3 ?C) 98.2 ?F (36.8 ?C) 98.6 ?F (37 ?C) 98.9 ?F (37.2 ?C)  ?TempSrc: Oral  Oral   ?SpO2: 100% 94% 96% 94%  ?Weight:      ? ?IV in left arm. ?Palpable right radial pulse. ? ?LABS:  ? ?Lab Results  ?Component Value Date  ? WBC 11.0 (H) 02/17/2022  ? HGB 8.0 (L) 02/17/2022  ? HCT 24.8 (L) 02/17/2022  ? MCV 88.9 02/17/2022  ? PLT 284 02/17/2022  ? ?Lab Results  ?Component Value Date  ? CREATININE 5.03 (H) 02/17/2022  ? ?Lab Results  ?Component Value Date  ? INR 0.91 11/13/2017  ? ?CBG (last 3)  ?Recent Labs  ?  02/17/22 ?1202 02/17/22 ?1644 02/17/22 ?2025  ?GLUCAP 104* 224* 158*  ? ? ? ?PROBLEM LIST:   ? ?Principal Problem: ?  Renal anasarca ?Active Problems: ?  Acute on chronic diastolic CHF (congestive heart failure) ([place]) ?  ESRD (end stage renal disease) ([place]) ?  Hyperkalemia ? ? ?CURRENT MEDS:  ? ? amLODipine  10 mg Oral Daily  ? aspirin EC  81 mg Oral Daily  ? atorvastatin  40 mg Oral Daily  ? calcium acetate  667 mg Oral TID WC  ? carvedilol  18.75 mg Oral BID WC  ? cephALEXin  500 mg Oral Q12H  ? Chlorhexidine Gluconate Cloth  6 each Topical Q0600  ? collagenase   Topical BID  ? darbepoetin (ARANESP) injection - DIALYSIS  40 mcg Intravenous Q Sat-HD  ? furosemide  60 mg Intravenous Once  ? heparin   5,000 Units Subcutaneous Q8H  ? insulin aspart  0-15 Units Subcutaneous TID WC  ? insulin aspart  0-5 Units Subcutaneous QHS  ? insulin glargine-yfgn  15 Units Subcutaneous QHS  ? risperiDONE  3 mg Oral QHS  ? ? ?Nancy [name] ?Office: [phone] ?02/18/2022 ? ?

## 2022-02-18 NOTE — Progress Notes (Signed)
Wound dressing changed at this time. ? ? ?Nancy [name] Brach [place] ? ?

## 2022-02-18 NOTE — Progress Notes (Signed)
RN noticed HD catheter was bleeding again. RN did a STAT order for IV team, who came and reinforced dressing. IV team stated they was not going to remove the current dressing because it would only continue to bleed, so they reinforced the dressing with more gauze and pressure to stop the bleeding. IV team stated they would come back to monitor catheter. This RN will continue to monitor pt. RN also messaged MD on call to make aware and to see if Heparin SQ still needs to be administered due to actively bleeding. Awaiting response from MD.  ? ?Tennis Must ? ?

## 2022-02-19 ENCOUNTER — Encounter (HOSPITAL_COMMUNITY)
Admission: EM | Disposition: A | Discharge status: Home / Self Care | Payer: Self-pay | Source: Home / Self Care | Attending: Family Medicine

## 2022-02-19 ENCOUNTER — Other Ambulatory Visit: Payer: Self-pay

## 2022-02-19 ENCOUNTER — Inpatient Hospital Stay (HOSPITAL_COMMUNITY): Payer: Medicaid Other | Admitting: Anesthesiology

## 2022-02-19 ENCOUNTER — Encounter (HOSPITAL_COMMUNITY): Payer: Self-pay | Admitting: Family Medicine

## 2022-02-19 DIAGNOSIS — E1122 Type 2 diabetes mellitus with diabetic chronic kidney disease: Secondary | ICD-10-CM

## 2022-02-19 DIAGNOSIS — N184 Chronic kidney disease, stage 4 (severe): Secondary | ICD-10-CM

## 2022-02-19 DIAGNOSIS — I509 Heart failure, unspecified: Secondary | ICD-10-CM

## 2022-02-19 DIAGNOSIS — L8990 Pressure ulcer of unspecified site, unspecified stage: Secondary | ICD-10-CM | POA: Insufficient documentation

## 2022-02-19 DIAGNOSIS — I2510 Atherosclerotic heart disease of native coronary artery without angina pectoris: Secondary | ICD-10-CM

## 2022-02-19 DIAGNOSIS — N049 Nephrotic syndrome with unspecified morphologic changes: Secondary | ICD-10-CM | POA: Diagnosis not present

## 2022-02-19 DIAGNOSIS — I132 Hypertensive heart and chronic kidney disease with heart failure and with stage 5 chronic kidney disease, or end stage renal disease: Secondary | ICD-10-CM

## 2022-02-19 DIAGNOSIS — N185 Chronic kidney disease, stage 5: Secondary | ICD-10-CM | POA: Diagnosis not present

## 2022-02-19 DIAGNOSIS — T82868A Thrombosis of vascular prosthetic devices, implants and grafts, initial encounter: Secondary | ICD-10-CM | POA: Diagnosis not present

## 2022-02-19 HISTORY — PX: AV FISTULA PLACEMENT: SHX1204

## 2022-02-19 LAB — GLUCOSE, CAPILLARY
Glucose-Capillary: 96 mg/dL (ref 70–99)
Glucose-Capillary: 107 mg/dL — ABNORMAL HIGH (ref 70–99)
Glucose-Capillary: 120 mg/dL — ABNORMAL HIGH (ref 70–99)
Glucose-Capillary: 115 mg/dL — ABNORMAL HIGH (ref 70–99)
Glucose-Capillary: 130 mg/dL — ABNORMAL HIGH (ref 70–99)
Glucose-Capillary: 110 mg/dL — ABNORMAL HIGH (ref 70–99)

## 2022-02-19 LAB — POCT I-STAT, CHEM 8
BUN: 46 mg/dL — ABNORMAL HIGH (ref 8–23)
Calcium, Ion: 1.17 mmol/L (ref 1.15–1.40)
Chloride: 98 mmol/L (ref 98–111)
Creatinine, Ser: 5.2 mg/dL — ABNORMAL HIGH (ref 0.44–1.00)
Glucose, Bld: 94 mg/dL (ref 70–99)
HCT: 28 % — ABNORMAL LOW (ref 36.0–46.0)
Hemoglobin: 9.5 g/dL — ABNORMAL LOW (ref 12.0–15.0)
Potassium: 4.2 mmol/L (ref 3.5–5.1)
Sodium: 135 mmol/L (ref 135–145)
TCO2: 28 mmol/L (ref 22–32)

## 2022-02-19 SURGERY — ARTERIOVENOUS (AV) FISTULA CREATION
Anesthesia: Choice | Laterality: Right

## 2022-02-19 SURGERY — ARTERIOVENOUS (AV) FISTULA CREATION
Anesthesia: Monitor Anesthesia Care | Site: Arm Upper | Laterality: Right

## 2022-02-19 MED ORDER — HEPARIN 6000 UNIT IRRIGATION SOLUTION
Status: DC | PRN
  Administered 2022-02-19: 1

## 2022-02-19 MED ORDER — CHLORHEXIDINE GLUCONATE 0.12 % MT SOLN
OROMUCOSAL | Status: AC
  Administered 2022-02-19: 15 mL via OROMUCOSAL
  Filled 2022-02-19: qty 15

## 2022-02-19 MED ORDER — BUPIVACAINE HCL (PF) 0.5 % IJ SOLN
INTRAMUSCULAR | Status: DC | PRN
  Administered 2022-02-19: 20 mL via PERINEURAL

## 2022-02-19 MED ORDER — FENTANYL CITRATE (PF) 250 MCG/5ML IJ SOLN
INTRAMUSCULAR | Status: DC | PRN
  Administered 2022-02-19 (×2): 25 ug via INTRAVENOUS

## 2022-02-19 MED ORDER — STERILE WATER FOR IRRIGATION IR SOLN
Status: DC | PRN
  Administered 2022-02-19: 1000 mL

## 2022-02-19 MED ORDER — LIDOCAINE-EPINEPHRINE (PF) 1 %-1:200000 IJ SOLN
INTRAMUSCULAR | Status: AC
  Filled 2022-02-19: qty 30

## 2022-02-19 MED ORDER — CEFAZOLIN SODIUM-DEXTROSE 2-4 GM/100ML-% IV SOLN: 2.0000 g | Freq: Once | INTRAVENOUS | Status: DC

## 2022-02-19 MED ORDER — ORAL CARE MOUTH RINSE: 15.0000 mL | Freq: Once | OROMUCOSAL | Status: AC

## 2022-02-19 MED ORDER — FENTANYL CITRATE (PF) 100 MCG/2ML IJ SOLN
INTRAMUSCULAR | Status: AC
  Filled 2022-02-19: qty 2

## 2022-02-19 MED ORDER — SODIUM CHLORIDE 0.9 % IV SOLN: INTRAVENOUS | Status: DC

## 2022-02-19 MED ORDER — CHLORHEXIDINE GLUCONATE 0.12 % MT SOLN: 15.0000 mL | Freq: Once | OROMUCOSAL | Status: DC

## 2022-02-19 MED ORDER — CHLORHEXIDINE GLUCONATE 0.12 % MT SOLN: 15.0000 mL | Freq: Once | OROMUCOSAL | Status: AC

## 2022-02-19 MED ORDER — THROMBIN (RECOMBINANT) 20000 UNITS EX SOLR
CUTANEOUS | Status: AC
  Filled 2022-02-19: qty 20000

## 2022-02-19 MED ORDER — ORAL CARE MOUTH RINSE: 15.0000 mL | Freq: Once | OROMUCOSAL | Status: DC

## 2022-02-19 MED ORDER — PROPOFOL 500 MG/50ML IV EMUL
INTRAVENOUS | Status: DC | PRN
  Administered 2022-02-19: 50 ug/kg/min via INTRAVENOUS

## 2022-02-19 MED ORDER — PHENYLEPHRINE 40 MCG/ML (10ML) SYRINGE FOR IV PUSH (FOR BLOOD PRESSURE SUPPORT)
PREFILLED_SYRINGE | INTRAVENOUS | Status: DC | PRN
  Administered 2022-02-19 (×2): 120 ug via INTRAVENOUS

## 2022-02-19 MED ORDER — DEXTROSE 5 % IV SOLN: 2000.0000 mg | Freq: Once | INTRAVENOUS | Status: DC

## 2022-02-19 MED ORDER — HEPARIN SODIUM (PORCINE) 1000 UNIT/ML IJ SOLN
INTRAMUSCULAR | Status: DC | PRN
  Administered 2022-02-19: 8000 [IU] via INTRAVENOUS

## 2022-02-19 MED ORDER — PROPOFOL 10 MG/ML IV BOLUS
INTRAVENOUS | Status: AC
  Filled 2022-02-19: qty 20

## 2022-02-19 MED ORDER — HEPARIN 6000 UNIT IRRIGATION SOLUTION
Status: AC
  Filled 2022-02-19: qty 500

## 2022-02-19 MED ORDER — OXYCODONE-ACETAMINOPHEN 5-325 MG PO TABS
1.0000 | ORAL_TABLET | ORAL | Status: DC | PRN
  Administered 2022-02-19 – 2022-02-21 (×3): 2 via ORAL
  Filled 2022-02-19 (×3): qty 2

## 2022-02-19 MED ORDER — LIDOCAINE HCL (PF) 1 % IJ SOLN
INTRAMUSCULAR | Status: AC
  Filled 2022-02-19: qty 30

## 2022-02-19 MED ORDER — PROTAMINE SULFATE 10 MG/ML IV SOLN
INTRAVENOUS | Status: DC | PRN
  Administered 2022-02-19 (×4): 10 mg via INTRAVENOUS

## 2022-02-19 MED ORDER — FENTANYL CITRATE (PF) 250 MCG/5ML IJ SOLN
INTRAMUSCULAR | Status: AC
  Filled 2022-02-19: qty 5

## 2022-02-19 MED ORDER — LIDOCAINE HCL (PF) 1 % IJ SOLN
INTRAMUSCULAR | Status: DC | PRN
  Administered 2022-02-19: 10 mL

## 2022-02-19 MED ORDER — CEFAZOLIN SODIUM-DEXTROSE 2-3 GM-%(50ML) IV SOLR
INTRAVENOUS | Status: DC | PRN
  Administered 2022-02-19: 2 g via INTRAVENOUS

## 2022-02-19 MED ORDER — PHENYLEPHRINE HCL-NACL 20-0.9 MG/250ML-% IV SOLN
INTRAVENOUS | Status: DC | PRN
Start: 2022-02-19 — End: 2022-02-19
  Administered 2022-02-19: 75 ug/min via INTRAVENOUS

## 2022-02-19 MED ORDER — 0.9 % SODIUM CHLORIDE (POUR BTL) OPTIME
TOPICAL | Status: DC | PRN
  Administered 2022-02-19: 1000 mL

## 2022-02-19 MED ORDER — INSULIN ASPART 100 UNIT/ML IJ SOLN: 0.0000 [IU] | INTRAMUSCULAR | Status: DC | PRN

## 2022-02-19 MED ORDER — CEFAZOLIN SODIUM-DEXTROSE 2-4 GM/100ML-% IV SOLN
INTRAVENOUS | Status: AC
  Filled 2022-02-19: qty 100

## 2022-02-19 MED ORDER — SODIUM CHLORIDE 0.9 % IV SOLN: INTRAVENOUS | Status: DC | PRN

## 2022-02-19 SURGICAL SUPPLY — 48 items
ARMBAND PINK RESTRICT EXTREMIT (MISCELLANEOUS) ×4 IMPLANT
BAG COUNTER SPONGE SURGICOUNT (BAG) ×2 IMPLANT
BAG SPNG CNTER NS LX DISP (BAG) ×1
BIOPATCH RED 1 DISK 7.0 (GAUZE/BANDAGES/DRESSINGS) ×1 IMPLANT
CANISTER SUCT 3000ML PPV (MISCELLANEOUS) ×2 IMPLANT
CANNULA VESSEL 3MM 2 BLNT TIP (CANNULA) ×2 IMPLANT
CLIP LIGATING EXTRA MED SLVR (CLIP) ×1 IMPLANT
CLIP VESOCCLUDE MED 6/CT (CLIP) ×2 IMPLANT
CLIP VESOCCLUDE SM WIDE 6/CT (CLIP) ×2 IMPLANT
COVER PROBE W GEL 5X96 (DRAPES) ×1 IMPLANT
DECANTER SPIKE VIAL GLASS SM (MISCELLANEOUS) ×2 IMPLANT
DERMABOND ADVANCED (GAUZE/BANDAGES/DRESSINGS) ×1
DERMABOND ADVANCED .7 DNX12 (GAUZE/BANDAGES/DRESSINGS) ×1 IMPLANT
DRSG COVADERM 4X6 (GAUZE/BANDAGES/DRESSINGS) ×1 IMPLANT
ELECT REM PT RETURN 9FT ADLT (ELECTROSURGICAL) ×2
ELECTRODE REM PT RTRN 9FT ADLT (ELECTROSURGICAL) ×1 IMPLANT
GAUZE 4X4 16PLY ~~LOC~~+RFID DBL (SPONGE) ×1 IMPLANT
GAUZE SPONGE 2X2 8PLY STRL LF (GAUZE/BANDAGES/DRESSINGS) IMPLANT
GLOVE SRG 8 PF TXTR STRL LF DI (GLOVE) ×1 IMPLANT
GLOVE SURG ENC MOIS LTX SZ7.5 (GLOVE) ×2 IMPLANT
GLOVE SURG MICRO LTX SZ6.5 (GLOVE) ×1 IMPLANT
GLOVE SURG POLY ORTHO LF SZ7.5 (GLOVE) IMPLANT
GLOVE SURG UNDER LTX SZ8 (GLOVE) ×2 IMPLANT
GLOVE SURG UNDER POLY LF SZ7.5 (GLOVE) ×3 IMPLANT
GLOVE SURG UNDER POLY LF SZ8 (GLOVE) ×2
GOWN STRL REUS W/ TWL LRG LVL3 (GOWN DISPOSABLE) ×3 IMPLANT
GOWN STRL REUS W/TWL LRG LVL3 (GOWN DISPOSABLE) ×4
KIT BASIN OR (CUSTOM PROCEDURE TRAY) ×2 IMPLANT
KIT TURNOVER KIT B (KITS) ×2 IMPLANT
LIGACLIP SM TITANIUM (CLIP) ×1 IMPLANT
LOOP VESSEL MINI RED (MISCELLANEOUS) ×1 IMPLANT
NS IRRIG 1000ML POUR BTL (IV SOLUTION) ×2 IMPLANT
PACK CV ACCESS (CUSTOM PROCEDURE TRAY) ×2 IMPLANT
PAD ARMBOARD 7.5X6 YLW CONV (MISCELLANEOUS) ×4 IMPLANT
SLING ARM FOAM STRAP LRG (SOFTGOODS) IMPLANT
SLING ARM FOAM STRAP MED (SOFTGOODS) IMPLANT
SPONGE GAUZE 2X2 STER 10/PKG (GAUZE/BANDAGES/DRESSINGS) ×1
SPONGE T-LAP 18X18 ~~LOC~~+RFID (SPONGE) ×1 IMPLANT
SUT MNCRL AB 4-0 PS2 18 (SUTURE) ×3 IMPLANT
SUT PROLENE 6 0 BV (SUTURE) ×3 IMPLANT
SUT SILK 2 0 (SUTURE) ×2
SUT SILK 2-0 18XBRD TIE 12 (SUTURE) IMPLANT
SUT VIC AB 3-0 SH 27 (SUTURE) ×4
SUT VIC AB 3-0 SH 27X BRD (SUTURE) ×1 IMPLANT
SYR BULB IRRIG 60ML STRL (SYRINGE) ×1 IMPLANT
TOWEL GREEN STERILE (TOWEL DISPOSABLE) ×2 IMPLANT
UNDERPAD 30X36 HEAVY ABSORB (UNDERPADS AND DIAPERS) ×2 IMPLANT
WATER STERILE IRR 1000ML POUR (IV SOLUTION) ×2 IMPLANT

## 2022-02-19 NOTE — Anesthesia Preprocedure Evaluation (Addendum)
Anesthesia Evaluation  ?Patient identified by MRN, date of birth, ID band ?Patient awake ? ? ? ?Reviewed: ?Allergy & Precautions, NPO status , Patient's Chart, lab work & pertinent test results ? ?Airway ?Mallampati: II ? ?TM Distance: >3 FB ?Neck ROM: Full ? ? ? Dental ? ?(+) Poor Dentition ?  ?Pulmonary ?neg pulmonary ROS, former smoker,  ?  ?Pulmonary exam normal ? ? ? ? ? ? ? Cardiovascular ?hypertension, Pt. on medications and Pt. on home beta blockers ?+ CAD, + Peripheral Vascular Disease and +CHF  ? ?Rhythm:Regular Rate:Normal ? ? ?  ?Neuro/Psych ?Depression CVA   ? GI/Hepatic ?Neg liver ROS, GERD  ,  ?Endo/Other  ?diabetes, Type 2, Insulin Dependent ? Renal/GU ?ESRFRenal disease  ?negative genitourinary ?  ?Musculoskeletal ?negative musculoskeletal ROS ?(+)  ? Abdominal ?Normal abdominal exam  (+)   ?Peds ? Hematology ? ?(+) Blood dyscrasia, anemia ,   ?Anesthesia Other Findings ? ? Reproductive/Obstetrics ? ?  ? ? ? ? ? ? ? ? ? ? ? ? ? ?  ?  ? ? ? ? ? ? ? ?Anesthesia Physical ?Anesthesia Plan ? ?ASA: 3 ? ?Anesthesia Plan: Regional and MAC  ? ?Post-op Pain Management: Regional block*  ? ?Induction: Intravenous ? ?PONV Risk Score and Plan: 2 and Propofol infusion, Midazolam, Treatment may vary due to age or medical condition and Ondansetron ? ?Airway Management Planned: Simple Face Mask, Natural Airway and Nasal Cannula ? ?Additional Equipment: None ? ?Intra-op Plan:  ? ?Post-operative Plan:  ? ?Informed Consent: I have reviewed the patients History and Physical, chart, labs and discussed the procedure including the risks, benefits and alternatives for the proposed anesthesia with the patient or authorized representative who has indicated his/her understanding and acceptance.  ? ? ? ?Dental advisory given ? ?Plan Discussed with: CRNA ? ?Anesthesia Plan Comments: (Lab Results ?     Component                Value               Date                 ?     WBC                       11.0 (H)            02/17/2022           ?     HGB                      8.0 (L)             02/17/2022           ?     HCT                      24.8 (L)            02/17/2022           ?     MCV                      88.9                02/17/2022           ?     PLT  284                 02/17/2022           ?Lab Results ?     Component                Value               Date                 ?     NA                       135                 02/17/2022           ?     K                        4.2                 02/17/2022           ?     CO2                      29                  02/17/2022           ?     GLUCOSE                  117 (H)             02/17/2022           ?     BUN                      49 (H)              02/17/2022           ?     CREATININE               5.03 (H)            02/17/2022           ?     CALCIUM                  8.4 (L)             02/17/2022           ?     EGFR                     17 (L)              10/20/2021           ?     GFRNONAA                 9 (L)               02/17/2022          )  ? ? ? ? ? ?Anesthesia Quick Evaluation ? ?

## 2022-02-19 NOTE — Anesthesia Procedure Notes (Signed)
Procedure Name: Christopher ?Date/Time: 02/19/2022 10:15 AM ?Performed by: Eligha Bridegroom, CRNA ?Pre-anesthesia Checklist: Patient identified, Emergency Drugs available, Suction available, Patient being monitored and Timeout performed ?Patient Re-evaluated:Patient Re-evaluated prior to induction ?Oxygen Delivery Method: Nasal cannula ?Induction Type: IV induction ? ? ? ? ?

## 2022-02-19 NOTE — Discharge Instructions (Signed)
° °  [facility] ° °Discharge Instructions ° °AV Fistula or Graft Surgery for Dialysis Access ° °Please refer to the following instructions for your post-procedure care. Your surgeon or physician assistant will discuss any changes with you. ° °Activity ° °You may drive the day following your surgery, if you are comfortable and no longer taking prescription pain medication. Resume full activity as the soreness in your incision resolves. ° °Bathing/Showering ° °You may shower after you go home. Keep your incision dry for 48 hours. Do not soak in a bathtub, hot tub, or swim until the incision heals completely. You may not shower if you have a hemodialysis catheter. ° °Incision Care ° °Clean your incision with mild soap and water after 48 hours. Pat the area dry with a clean towel. You do not need a bandage unless otherwise instructed. Do not apply any ointments or creams to your incision. You may have skin glue on your incision. Do not peel it off. It will come off on its own in about one week. Your arm may swell a bit after surgery. To reduce swelling use pillows to elevate your arm so it is above your heart. Your doctor will tell you if you need to lightly wrap your arm with an ACE bandage. ° °Diet ° °Resume your normal diet. There are not special food restrictions following this procedure. In order to heal from your surgery, it is CRITICAL to get adequate nutrition. Your body requires vitamins, minerals, and protein. Vegetables are the best source of vitamins and minerals. Vegetables also provide the perfect balance of protein. Processed food has little nutritional value, so try to avoid this. ° °Medications ° °Resume taking all of your medications. If your incision is causing pain, you may take over-the counter pain relievers such as acetaminophen (Tylenol). If you were prescribed a stronger pain medication, please be aware these medications can cause nausea and constipation. Prevent  nausea by taking the medication with a snack or meal. Avoid constipation by drinking plenty of fluids and eating foods with high amount of fiber, such as fruits, vegetables, and grains. Do not take Tylenol if you are taking prescription pain medications. ° ° ° ° °Follow up °Your surgeon may want to see you in the office following your access surgery. If so, this will be arranged at the time of your surgery. ° °Please call us immediately for any of the following conditions: ° °Increased pain, redness, drainage (pus) from your incision site °Fever of 101 degrees or higher °Severe or worsening pain at your incision site °Hand pain or numbness. ° °Reduce your risk of vascular disease: ° °Stop smoking. If you would like help, call [facility] at 1-800-QUIT-NOW (1-800-784-8669) or [place] at 336-586-4000 ° °Manage your cholesterol °Maintain a desired weight °Control your diabetes °Keep your blood pressure down ° °Dialysis ° °It will take several weeks to several months for your new dialysis access to be ready for use. Your surgeon will determine when it is OK to use it. Your nephrologist will continue to direct your dialysis. You can continue to use your Permcath until your new access is ready for use. ° °If you have any questions, please call the office at [phone]. ° °

## 2022-02-19 NOTE — Anesthesia Procedure Notes (Signed)
?  Anesthesia Regional Block: Supraclavicular block  ? ?Pre-Anesthetic Checklist: , timeout performed,  Correct Patient, Correct Site, Correct Laterality,  Correct Procedure, Correct Position, site marked,  Risks and benefits discussed,  Surgical consent,  Pre-op evaluation,  At surgeon's request and post-op pain management ? ?Laterality: Right ? ?Prep: Dura Prep     ?  ?Needles:  ?Injection technique: Single-shot ? ?Needle Type: Echogenic Stimulator Needle   ? ? ?Needle Length: 5cm  ?Needle Gauge: 20  ? ? ? ?Additional Needles: ? ? ?Procedures:,,,, ultrasound used (permanent image in chart),,    ?Narrative:  ?Start time: 02/19/2022 9:54 AM ?End time: 02/19/2022 9:58 AM ?Injection made incrementally with aspirations every 5 mL. ? ?Performed by: Personally  ?Anesthesiologist: Darral Dash, DO ? ?Additional Notes: ?Patient identified. Risks/Benefits/Options discussed with patient including but not limited to bleeding, infection, nerve damage, failed block, incomplete pain control. Patient expressed understanding and wished to proceed. All questions were answered. Sterile technique was used throughout the entire procedure. Please see nursing notes for vital signs. Aspirated in 5cc intervals with injection for negative confirmation. Patient was given instructions on fall risk and not to get out of bed. All questions and concerns addressed with instructions to call with any issues or inadequate analgesia.   ?  ? ? ? ?

## 2022-02-19 NOTE — Progress Notes (Signed)
PROGRESS NOTE    Nancy VAZGUEZ  MVE:[id number] DOB: 05/21/1961 DOA: 02/14/2022 PCP: [name], MD   Brief Narrative:  Nancy [name] is a 61 y.o. female with medical history significant of CKD stage V, hypertension, type 2 diabetes mellitus, CAD, chronic anemia, depression and many other comorbidities  presented to ED with a complaint of bilateral leg swelling despite of being able to make urine. She has also noticed to have some exertional shortness of breath but no shortness of breath at rest. patient had graft placement in right arm on 12/22/2021 however that failed to mature.  She was admitted for the need of new hemodialysis.  Assessment & Plan:   Principal Problem:   Renal anasarca Active Problems:   Acute on chronic diastolic CHF (congestive heart failure) (HCC)   ESRD (end stage renal disease) (HCC)   Hyperkalemia   Pressure injury of skin  Renal anasarca/progressively worsening CKD stage V, now ESRD: TDC was placed by IR on 01/18/2022 and she underwent her first dialysis as well.  Her right upper extremity BC AVF failed to mature.  She was seen by vascular surgery and underwent right basilic vein transposition on 02/19/2022. CLIP complete [facility] MWF 2nd shift. Plan for HD#3 3/6.  Patient is now getting closer to discharge and perhaps may discharge in 1 to 2 days.  I will consult PT OT.  Acute on chronic diastolic congestive heart failure: Volume managed by HD   Hyperkalemia: Resolved.   Type 2 diabetes mellitus: She appears to be taking Lantus 30 units.  However she is on 15 units here and her blood sugar is controlled.  Continue SSI.   Essential hypertension: Controlled.  Continue home medications.   Morbid obesity: Weight loss counseling provided.   HLD: Continue atorvastatin.   History of CAD: Asymptomatic.  Continue aspirin and Coreg.  Unstageable bilateral heel pressure wounds: Wound care on board.  DVT prophylaxis: heparin injection 5,000 Units Start:  02/14/22 2200   Code Status: Full Code  Family Communication:  None present at bedside.  Plan of care discussed with patient in length and he/she verbalized understanding and agreed with it.  Status is: Inpatient Remains inpatient appropriate because: Needs TDC in dialysis  Estimated body mass index is 37.45 kg/m as calculated from the following:   Height as of this encounter: '5\' 3"'$  (1.6 m).   Weight as of this encounter: 95.9 kg.  Pressure Injury 02/15/22 Heel Left Unstageable - Full thickness tissue loss in which the base of the injury is covered by slough (yellow, tan, gray, green or brown) and/or eschar (tan, brown or black) in the wound bed. (Active)  02/15/22 1856  Location: Heel  Location Orientation: Left  Staging: Unstageable - Full thickness tissue loss in which the base of the injury is covered by slough (yellow, tan, gray, green or brown) and/or eschar (tan, brown or black) in the wound bed.  Wound Description (Comments):   Present on Admission: Yes     Pressure Injury 02/18/22 Heel Right Stage 3 -  Full thickness tissue loss. Subcutaneous fat may be visible but bone, tendon or muscle are NOT exposed. (Active)  02/18/22 1835  Location: Heel  Location Orientation: Right  Staging: Stage 3 -  Full thickness tissue loss. Subcutaneous fat may be visible but bone, tendon or muscle are NOT exposed.  Wound Description (Comments):   Present on Admission:    Nutritional Assessment: Body mass index is 37.45 kg/m.Marland Kitchen Seen by dietician.  I agree with  the assessment and plan as outlined below: Nutrition Status:        . Skin Assessment: I have examined the patient's skin and I agree with the wound assessment as performed by the wound care RN as outlined below: Pressure Injury 02/15/22 Heel Left Unstageable - Full thickness tissue loss in which the base of the injury is covered by slough (yellow, tan, gray, green or brown) and/or eschar (tan, brown or black) in the wound bed.  (Active)  02/15/22 1856  Location: Heel  Location Orientation: Left  Staging: Unstageable - Full thickness tissue loss in which the base of the injury is covered by slough (yellow, tan, gray, green or brown) and/or eschar (tan, brown or black) in the wound bed.  Wound Description (Comments):   Present on Admission: Yes     Pressure Injury 02/18/22 Heel Right Stage 3 -  Full thickness tissue loss. Subcutaneous fat may be visible but bone, tendon or muscle are NOT exposed. (Active)  02/18/22 1835  Location: Heel  Location Orientation: Right  Staging: Stage 3 -  Full thickness tissue loss. Subcutaneous fat may be visible but bone, tendon or muscle are NOT exposed.  Wound Description (Comments):   Present on Admission:     Consultants:  Nephrology IR  Procedures:  As above  Antimicrobials:  Anti-infectives (From admission, onward)    Start     Dose/Rate Route Frequency Ordered Stop   02/19/22 1015  ceFAZolin (ANCEF) 2,000 mg in dextrose 5 % 100 mL IVPB  Status:  Discontinued        2,000 mg 240 mL/hr over 30 Minutes Intravenous  Once 02/19/22 1004 02/19/22 1010   02/19/22 1015  ceFAZolin (ANCEF) IVPB 2g/100 mL premix  Status:  Discontinued        2 g 200 mL/hr over 30 Minutes Intravenous  Once 02/19/22 1011 02/19/22 1254   02/19/22 1005  ceFAZolin (ANCEF) 2-4 GM/100ML-% IVPB       Note to Pharmacy: Roosvelt Maser N: cabinet override      02/19/22 1005 02/19/22 2214   02/15/22 1300  ceFAZolin (ANCEF) IVPB 2g/100 mL premix        2 g 200 mL/hr over 30 Minutes Intravenous To Radiology 02/15/22 1224 02/15/22 1416   02/15/22 0800  ceFAZolin (ANCEF) IVPB 1 g/50 mL premix  Status:  Discontinued        1 g 100 mL/hr over 30 Minutes Intravenous To Radiology 02/15/22 0746 02/15/22 1224   02/14/22 2200  cephALEXin (KEFLEX) capsule 500 mg        500 mg Oral Every 12 hours 02/14/22 1747           Subjective:  Seen and examined before she went for the procedure.  She had no  complaints.  Objective: Vitals:   02/19/22 1145 02/19/22 1200 02/19/22 1215 02/19/22 1247  BP: (!) 110/54 118/67 (!) 113/55 (!) 128/54  Pulse: 69 68 66 70  Resp: '17 13 14 17  '$ Temp: (!) 97.2 F (36.2 C)  (!) 97.4 F (36.3 C) 98.3 F (36.8 C)  TempSrc:      SpO2: 100% 100% 94% 93%  Weight:      Height:        Intake/Output Summary (Last 24 hours) at 02/19/2022 1346 Last data filed at 02/19/2022 1135 Gross per 24 hour  Intake 2751 ml  Output --  Net 2751 ml    Filed Weights   02/17/22 1126 02/19/22 0500 02/19/22 0927  Weight: 92.4 kg 95.9 kg  95.9 kg    Examination:  General exam: Appears calm and comfortable  Respiratory system: Clear to auscultation. Respiratory effort normal. Cardiovascular system: S1 & S2 heard, RRR. No JVD, murmurs, rubs, gallops or clicks. No pedal edema. Gastrointestinal system: Abdomen is nondistended, soft and nontender. No organomegaly or masses felt. Normal bowel sounds heard. Central nervous system: Alert and oriented. No focal neurological deficits. Extremities: Symmetric 5 x 5 power. Skin: No rashes, lesions or ulcers.  Psychiatry: Judgement and insight appear poor  Data Reviewed: I have personally reviewed following labs and imaging studies  CBC: Recent Labs  Lab 02/14/22 1500 02/15/22 0838 02/15/22 0846 02/15/22 1944 02/16/22 0122 02/17/22 0749 02/19/22 0949  WBC 9.6  --  QUESTIONABLE RESULTS, RECOMMEND RECOLLECT TO VERIFY 9.2 9.4 11.0*  --   NEUTROABS 6.6  --   --   --   --   --   --   HGB 8.8*   < > QUESTIONABLE RESULTS, RECOMMEND RECOLLECT TO VERIFY 10.6* 8.6* 8.0* 9.5*  HCT 27.0*   < > QUESTIONABLE RESULTS, RECOMMEND RECOLLECT TO VERIFY 32.0* 26.1* 24.8* 28.0*  MCV 88.5  --  QUESTIONABLE RESULTS, RECOMMEND RECOLLECT TO VERIFY 87.4 88.5 88.9  --   PLT 320  --  QUESTIONABLE RESULTS, RECOMMEND RECOLLECT TO VERIFY 314 297 284  --    < > = values in this interval not displayed.    Basic Metabolic Panel: Recent Labs  Lab  02/14/22 1500 02/14/22 1639 02/15/22 0838 02/15/22 1944 02/17/22 0749 02/19/22 0949  NA 131*  --  134* 134* 135 135  K 6.0*  --  4.8 4.5 4.2 4.2  CL 95*  --  102 98 96* 98  CO2 26  --   --  21* 29  --   GLUCOSE 97  --  53* 127* 117* 94  BUN 72*  --  67* 72* 49* 46*  CREATININE 6.03* 6.20* 7.30* 6.17* 5.03* 5.20*  CALCIUM 8.7*  --   --  9.0 8.4*  --   MG  --  2.7*  --   --   --   --   PHOS  --   --   --  6.5* 5.6*  --     GFR: Estimated Creatinine Clearance: 12.5 mL/min (A) (by C-G formula based on SCr of 5.2 mg/dL (H)). Liver Function Tests: Recent Labs  Lab 02/14/22 1639 02/15/22 1944 02/17/22 0749  AST 13* 20  --   ALT 9 15  --   ALKPHOS 46 61  --   BILITOT 0.6 0.3  --   PROT 7.7 7.8  --   ALBUMIN 3.2* 3.2* 2.7*    No results for input(s): LIPASE, AMYLASE in the last 168 hours. No results for input(s): AMMONIA in the last 168 hours. Coagulation Profile: No results for input(s): INR, PROTIME in the last 168 hours. Cardiac Enzymes: No results for input(s): CKTOTAL, CKMB, CKMBINDEX, TROPONINI in the last 168 hours. BNP (last 3 results) No results for input(s): PROBNP in the last 8760 hours. HbA1C: No results for input(s): HGBA1C in the last 72 hours.  CBG: Recent Labs  Lab 02/18/22 2133 02/19/22 0727 02/19/22 0904 02/19/22 1144 02/19/22 1248  GLUCAP 175* 96 107* 120* 115*    Lipid Profile: No results for input(s): CHOL, HDL, LDLCALC, TRIG, CHOLHDL, LDLDIRECT in the last 72 hours. Thyroid Function Tests: No results for input(s): TSH, T4TOTAL, FREET4, T3FREE, THYROIDAB in the last 72 hours. Anemia Panel: No results for input(s): VITAMINB12, FOLATE, FERRITIN, TIBC,  IRON, RETICCTPCT in the last 72 hours.  Sepsis Labs: No results for input(s): PROCALCITON, LATICACIDVEN in the last 168 hours.  Recent Results (from the past 240 hour(s))  Resp Panel by RT-PCR (Flu A&B, Covid) Nasopharyngeal Swab     Status: None   Collection Time: 02/14/22  4:40 PM    Specimen: Nasopharyngeal Swab; Nasopharyngeal(NP) swabs in vial transport medium  Result Value Ref Range Status   SARS Coronavirus 2 by RT PCR NEGATIVE NEGATIVE Final    Comment: (NOTE) SARS-CoV-2 target nucleic acids are NOT DETECTED.  The SARS-CoV-2 RNA is generally detectable in upper respiratory specimens during the acute phase of infection. The lowest concentration of SARS-CoV-2 viral copies this assay can detect is 138 copies/mL. A negative result does not preclude SARS-Cov-2 infection and should not be used as the sole basis for treatment or other patient management decisions. A negative result may occur with  improper specimen collection/handling, submission of specimen other than nasopharyngeal swab, presence of viral mutation(s) within the areas targeted by this assay, and inadequate number of viral copies(<138 copies/mL). A negative result must be combined with clinical observations, patient history, and epidemiological information. The expected result is Negative.  Fact Sheet for Patients:  EntrepreneurPulse.com.au  Fact Sheet for Healthcare Providers:  IncredibleEmployment.be  This test is no t yet approved or cleared by the [place] FDA and  has been authorized for detection and/or diagnosis of SARS-CoV-2 by FDA under an Emergency Use Authorization (EUA). This EUA will remain  in effect (meaning this test can be used) for the duration of the COVID-19 declaration under Section 564(b)(1) of the Act, 21 U.S.C.section 360bbb-3(b)(1), unless the authorization is terminated  or revoked sooner.       Influenza A by PCR NEGATIVE NEGATIVE Final   Influenza B by PCR NEGATIVE NEGATIVE Final    Comment: (NOTE) The Xpert Xpress SARS-CoV-2/FLU/RSV plus assay is intended as an aid in the diagnosis of influenza from Nasopharyngeal swab specimens and should not be used as a sole basis for treatment. Nasal washings and aspirates are  unacceptable for Xpert Xpress SARS-CoV-2/FLU/RSV testing.  Fact Sheet for Patients: EntrepreneurPulse.com.au  Fact Sheet for Healthcare Providers: IncredibleEmployment.be  This test is not yet approved or cleared by the [place] FDA and has been authorized for detection and/or diagnosis of SARS-CoV-2 by FDA under an Emergency Use Authorization (EUA). This EUA will remain in effect (meaning this test can be used) for the duration of the COVID-19 declaration under Section 564(b)(1) of the Act, 21 U.S.C. section 360bbb-3(b)(1), unless the authorization is terminated or revoked.  Performed at [facility], [place] [address] [place], [place] [address]       Radiology Studies: No results found.  Scheduled Meds:  amLODipine  10 mg Oral Daily   aspirin EC  81 mg Oral Daily   atorvastatin  40 mg Oral Daily   calcium acetate  667 mg Oral TID WC   carvedilol  18.75 mg Oral BID WC   cephALEXin  500 mg Oral Q12H   Chlorhexidine Gluconate Cloth  6 each Topical Q0600   collagenase   Topical BID   darbepoetin (ARANESP) injection - DIALYSIS  40 mcg Intravenous Q Sat-HD   fentaNYL       furosemide  60 mg Intravenous Once   heparin  5,000 Units Subcutaneous Q8H   insulin aspart  0-15 Units Subcutaneous TID WC   insulin aspart  0-5 Units Subcutaneous QHS   insulin glargine-yfgn  15 Units Subcutaneous  QHS   risperiDONE  3 mg Oral QHS   Continuous Infusions:  ceFAZolin     ferric gluconate (FERRLECIT) IVPB 125 mg (02/17/22 1021)     LOS: 5 days   Time spent: 23 minutes  Darliss Cheney, MD [facility]  02/19/2022, 1:46 PM  Please page via [facility] and do not message via secure chat for urgent patient care matters. Secure chat can be used for non urgent patient care matters.  How to contact the [facility] Attending or Consulting provider [place] or covering provider during after hours [place], for this patient?  Check the care team in [facility] and  look for a) attending/consulting [facility] provider listed and b) the [facility] team listed. Page or secure chat [phone]. Log into [email or web address] and use [place]'s universal password to access. If you do not have the password, please contact the hospital operator. Locate the [facility] provider you are looking for under [facility] and page to a number that you can be directly reached. If you still have difficulty reaching the provider, please page the [facility] (Director on Call) for the Hospitalists listed on amion for assistance.

## 2022-02-19 NOTE — Anesthesia Postprocedure Evaluation (Signed)
Anesthesia Post Note ? ?Patient: Nancy [name] ? ?Procedure(s) Performed: RIGHT ARTERIOVENOUS FISTULA (Right: Arm Upper) ? ?  ? ?Patient location during evaluation: PACU ?Anesthesia Type: Regional and MAC ?Level of consciousness: awake and alert ?Pain management: pain level controlled ?Vital Signs Assessment: post-procedure vital signs reviewed and stable ?Respiratory status: spontaneous breathing, nonlabored ventilation, respiratory function stable and patient connected to nasal cannula oxygen ?Cardiovascular status: stable and blood pressure returned to baseline ?Postop Assessment: no apparent nausea or vomiting ?Anesthetic complications: no ? ? ?No notable events documented. ? ?Last Vitals:  ?Vitals:  ? 02/19/22 1200 02/19/22 1215  ?BP: 118/67 (!) 113/55  ?Pulse: 68 66  ?Resp: 13 14  ?Temp:  (!) 36.3 ?C  ?SpO2: 100% 94%  ?  ?Last Pain:  ?Vitals:  ? 02/19/22 1145  ?TempSrc:   ?PainSc: 0-No pain  ? ? ?  ?  ?  ?  ?  ?  ? ?March Rummage Viveca Beckstrom ? ? ? ? ?

## 2022-02-19 NOTE — Interval H&P Note (Signed)
History and Physical Interval Note: ? ?02/19/2022 ?7:45 AM ? ?Nancy [name]  has presented today for surgery, with the diagnosis of ESRD.  The various methods of treatment have been discussed with the patient and family. After consideration of risks, benefits and other options for treatment, the patient has consented to  Procedure(s): ?RIGHT ARTERIOVENOUS (AV) FISTULA VS GRAFT (Right) as a surgical intervention.  The patient's history has been reviewed, patient examined, no change in status, stable for surgery.  I have reviewed the patient's chart and labs.  Questions were answered to the patient's satisfaction.   ? ? ?[name] ? ? ?

## 2022-02-19 NOTE — Progress Notes (Signed)
Nancy [name] ?ROUNDING NOTE  ? ?Subjective:  ? ?Interval History: 61 year old lady with new start end-stage renal disease.  History of hypertension diabetes.  Status post dialysis 02/16/2022 and 02/17/2022.  Next dialysis will be 02/19/2022.  She has an AV fistula to be placed Monday, 02/19/2022.  She is clipped to [facility] ? ?Blood pressure 139/59 pulse 70 temperature 99 O2 sats 95% ? ?Sodium 135 potassium 4.2 chloride 96 CO2 29 BUN 59 creatinine 5 glucose 117 calcium 8.4 phosphorus 5.6 albumin 2.7 hemoglobin 8 ? ? ?Objective:  ?Vital signs in last 24 hours:  ?Temp:  [98.8 ?F (37.1 ?C)-99.1 ?F (37.3 ?C)] 99 ?F (37.2 ?C) (03/06 0531) ?Pulse Rate:  [70-72] 70 (03/06 0531) ?Resp:  [17-18] 18 (03/06 0531) ?BP: (123-148)/(59-78) 139/59 (03/06 0531) ?SpO2:  [94 %-100 %] 95 % (03/06 0531) ?Weight:  [95.9 kg] 95.9 kg (03/06 0500) ? ?Weight change: 0.9 kg ?Filed Weights  ? 02/17/22 0815 02/17/22 1126 02/19/22 0500  ?Weight: 95 kg 92.4 kg 95.9 kg  ? ? ?Intake/Output: ?I/O last 3 completed shifts: ?In: 2741 [P.O.:2741] ?Out: 925 [Urine:925] ?  ?Intake/Output this shift: ? No intake/output data recorded. ? ?CVS- RRR ?RS- CTA ?ABD- BS present soft non-distended ?EXT-1+ lower extremity edema. ? ? ?Basic Metabolic Panel: ?Recent Labs  ?Lab 02/14/22 ?1500 02/14/22 ?1639 02/15/22 ?0865 02/15/22 ?1944 02/17/22 ?0749  ?NA 131*  --  134* 134* 135  ?K 6.0*  --  4.8 4.5 4.2  ?CL 95*  --  102 98 96*  ?CO2 26  --   --  21* 29  ?GLUCOSE 97  --  53* 127* 117*  ?BUN 72*  --  67* 72* 49*  ?CREATININE 6.03* 6.20* 7.30* 6.17* 5.03*  ?CALCIUM 8.7*  --   --  9.0 8.4*  ?MG  --  2.7*  --   --   --   ?PHOS  --   --   --  6.5* 5.6*  ? ? ?Liver Function Tests: ?Recent Labs  ?Lab 02/14/22 ?1639 02/15/22 ?1944 02/17/22 ?0749  ?AST 13* 20  --   ?ALT 9 15  --   ?ALKPHOS 46 61  --   ?BILITOT 0.6 0.3  --   ?PROT 7.7 7.8  --   ?ALBUMIN 3.2* 3.2* 2.7*  ? ?No results for input(s): LIPASE, AMYLASE in the last 168 hours. ?No results for  input(s): AMMONIA in the last 168 hours. ? ?CBC: ?Recent Labs  ?Lab 02/14/22 ?1500 02/15/22 ?7846 02/15/22 ?9629 02/15/22 ?1944 02/16/22 ?0122 02/17/22 ?5284  ?WBC 9.6  --  QUESTIONABLE RESULTS, RECOMMEND RECOLLECT TO VERIFY 9.2 9.4 11.0*  ?NEUTROABS 6.6  --   --   --   --   --   ?HGB 8.8* 9.9* QUESTIONABLE RESULTS, RECOMMEND RECOLLECT TO VERIFY 10.6* 8.6* 8.0*  ?HCT 27.0* 29.0* QUESTIONABLE RESULTS, RECOMMEND RECOLLECT TO VERIFY 32.0* 26.1* 24.8*  ?MCV 88.5  --  QUESTIONABLE RESULTS, RECOMMEND RECOLLECT TO VERIFY 87.4 88.5 88.9  ?PLT 320  --  QUESTIONABLE RESULTS, RECOMMEND RECOLLECT TO VERIFY 314 297 284  ? ? ?Cardiac Enzymes: ?No results for input(s): CKTOTAL, CKMB, CKMBINDEX, TROPONINI in the last 168 hours. ? ?BNP: ?Invalid input(s): POCBNP ? ?CBG: ?Recent Labs  ?Lab 02/17/22 ?2025 02/18/22 ?1124 02/18/22 ?1623 02/18/22 ?2133 02/19/22 ?1324  ?GLUCAP 158* 115* 165* 175* 96  ? ? ?Microbiology: ?Results for orders placed or performed during the hospital encounter of 02/14/22  ?Resp Panel by RT-PCR (Flu A&B, Covid) Nasopharyngeal Swab     Status: None  ?  Collection Time: 02/14/22  4:40 PM  ? Specimen: Nasopharyngeal Swab; Nasopharyngeal(NP) swabs in vial transport medium  ?Result Value Ref Range Status  ? SARS Coronavirus 2 by RT PCR NEGATIVE NEGATIVE Final  ?  Comment: (NOTE) ?SARS-CoV-2 target nucleic acids are NOT DETECTED. ? ?The SARS-CoV-2 RNA is generally detectable in upper respiratory ?specimens during the acute phase of infection. The lowest ?concentration of SARS-CoV-2 viral copies this assay can detect is ?138 copies/mL. A negative result does not preclude SARS-Cov-2 ?infection and should not be used as the sole basis for treatment or ?other patient management decisions. A negative result may occur with  ?improper specimen collection/handling, submission of specimen other ?than nasopharyngeal swab, presence of viral mutation(s) within the ?areas targeted by this assay, and inadequate number of  viral ?copies(<138 copies/mL). A negative result must be combined with ?clinical observations, patient history, and epidemiological ?information. The expected result is Negative. ? ?Fact Sheet for Patients:  ?EntrepreneurPulse.com.au ? ?Fact Sheet for Healthcare Providers:  ?IncredibleEmployment.be ? ?This test is no t yet approved or cleared by the [place] FDA and  ?has been authorized for detection and/or diagnosis of SARS-CoV-2 by ?FDA under an Emergency Use Authorization (EUA). This EUA will remain  ?in effect (meaning this test can be used) for the duration of the ?COVID-19 declaration under Section 564(b)(1) of the Act, 21 ?U.S.C.section 360bbb-3(b)(1), unless the authorization is terminated  ?or revoked sooner.  ? ? ?  ? Influenza A by PCR NEGATIVE NEGATIVE Final  ? Influenza B by PCR NEGATIVE NEGATIVE Final  ?  Comment: (NOTE) ?The Xpert Xpress SARS-CoV-2/FLU/RSV plus assay is intended as an aid ?in the diagnosis of influenza from Nasopharyngeal swab specimens and ?should not be used as a sole basis for treatment. Nasal washings and ?aspirates are unacceptable for Xpert Xpress SARS-CoV-2/FLU/RSV ?testing. ? ?Fact Sheet for Patients: ?EntrepreneurPulse.com.au ? ?Fact Sheet for Healthcare Providers: ?IncredibleEmployment.be ? ?This test is not yet approved or cleared by the [place] FDA and ?has been authorized for detection and/or diagnosis of SARS-CoV-2 by ?FDA under an Emergency Use Authorization (EUA). This EUA will remain ?in effect (meaning this test can be used) for the duration of the ?COVID-19 declaration under Section 564(b)(1) of the Act, 21 U.S.C. ?section 360bbb-3(b)(1), unless the authorization is terminated or ?revoked. ? ?Performed at [facility], [place] [address] [place], [place] ?[address] ?  ? ? ?Coagulation Studies: ?No results for input(s): LABPROT, INR in the last 72 hours. ? ?Urinalysis: ?No results  for input(s): COLORURINE, LABSPEC, [place], GLUCOSEU, HGBUR, BILIRUBINUR, KETONESUR, PROTEINUR, UROBILINOGEN, NITRITE, LEUKOCYTESUR in the last 72 hours. ? ?Invalid input(s): APPERANCEUR  ? ? ?Imaging: ?No results found. ? ? ?Medications:  ? ? ferric gluconate (FERRLECIT) IVPB 125 mg (02/17/22 1021)  ? ? amLODipine  10 mg Oral Daily  ? aspirin EC  81 mg Oral Daily  ? atorvastatin  40 mg Oral Daily  ? calcium acetate  667 mg Oral TID WC  ? carvedilol  18.75 mg Oral BID WC  ? cephALEXin  500 mg Oral Q12H  ? chlorhexidine  15 mL Mouth/Throat Once  ? Or  ? mouth rinse  15 mL Mouth Rinse Once  ? Chlorhexidine Gluconate Cloth  6 each Topical Q0600  ? collagenase   Topical BID  ? darbepoetin (ARANESP) injection - DIALYSIS  40 mcg Intravenous Q Sat-HD  ? furosemide  60 mg Intravenous Once  ? heparin  5,000 Units Subcutaneous Q8H  ? insulin aspart  0-15 Units Subcutaneous  TID WC  ? insulin aspart  0-5 Units Subcutaneous QHS  ? insulin glargine-yfgn  15 Units Subcutaneous QHS  ? risperiDONE  3 mg Oral QHS  ? ?acetaminophen, cloNIDine, heparin sodium (porcine), mineral oil-hydrophilic petrolatum, ondansetron **OR** ondansetron (ZOFRAN) IV ? ?Assessment/ Plan:  ?ESRD-new start secondary to diabetes.  Looks like patient has been clipped to [facility].  She is scheduled for AV fistula 02/19/2022.  She will get dialysis 02/19/2022. ?ANEMIA-iron and darbepoetin initiated. 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 ?HTN/VOL-improving with ultrafiltration ?ACCESS-AV fistula to be placed 02/19/2022. ? ? ? LOS: 5 ?Sherril Croon ?'@TODAY''@7'$ :51 AM ?  ?

## 2022-02-19 NOTE — Transfer of Care (Signed)
Immediate Anesthesia Transfer of Care Note ? ?Patient: Nancy [name] ? ?Procedure(s) Performed: RIGHT ARTERIOVENOUS FISTULA (Right: Arm Upper) ? ?Patient Location: PACU ? ?Anesthesia Type:MAC combined with regional for post-op pain ? ?Level of Consciousness: awake and alert  ? ?Airway & Oxygen Therapy: Patient Spontanous Breathing and Patient connected to nasal cannula oxygen ? ?Post-op Assessment: Report given to RN and Post -op Vital signs reviewed and stable ? ?Post vital signs: Reviewed and stable ? ?Last Vitals:  ?Vitals Value Taken Time  ?BP 110/54 02/19/22 1145  ?Temp    ?Pulse 66 02/19/22 1148  ?Resp 14 02/19/22 1148  ?SpO2 99 % 02/19/22 1148  ?Vitals shown include unvalidated device data. ? ?Last Pain:  ?Vitals:  ? 02/19/22 0942  ?TempSrc:   ?PainSc: 0-No pain  ?   ? ?  ? ?Complications: No notable events documented. ?

## 2022-02-19 NOTE — TOC Progression Note (Signed)
Transition of Care (TOC) - Progression Note  ? ? ?Patient Details  ?Name: Nancy [name] ?MRN: [id number] ?Date of Birth: 02-Mar-1961 ? ?Transition of Care (TOC) CM/SW Contact  ?Tom-Johnson, Renea Ee, RN ?Phone Number: ?02/19/2022, 12:59 PM ? ?Clinical Narrative:    ? ?Patient went for a Rt Basilic Vein Transposition today. No recommendations noted.CM will continue to follow with needs. ? ?Expected Discharge Plan: Home/Self Care ?Barriers to Discharge: Continued Medical Work up ? ?Expected Discharge Plan and Services ?Expected Discharge Plan: Home/Self Care ?  ?Discharge Planning Services: CM Consult ?Post Acute Care Choice: NA ?Living arrangements for the past 2 months: [place] ?                ?DME Arranged: N/A ?DME Agency: NA ?  ?  ?  ?HH Arranged: NA ?[place] Agency: NA ?  ?  ?  ? ? ?Social Determinants of Health (SDOH) Interventions ?  ? ?Readmission Risk Interventions ?Readmission Risk Prevention Plan 02/16/2022 09/21/2021 03/07/2020  ?Transportation Screening Complete Complete Complete  ?PCP or Specialist Appt within 3-5 Days - - Complete  ?[place] or Home Care Consult - - (No Data)  ?Social Work Consult for [place] Planning/Counseling - - Complete  ?Palliative Care Screening - - Not Applicable  ?Medication Review Press photographer) Complete Complete Complete  ?PCP or Specialist appointment within 3-5 days of discharge - Complete -  ?[place] or [place] - Complete -  ?SW Recovery Care/Counseling Consult - Complete -  ?Palliative Care Screening - Complete -  ?Skilled Nursing Facility - Not Applicable -  ?Some recent data might be hidden  ? ? ?

## 2022-02-19 NOTE — Op Note (Signed)
? ? ?  NAME: Nancy [name]    MRN: [id number] ?DOB: July 20, 1961    DATE OF OPERATION: 02/19/2022 ? ?PREOP DIAGNOSIS:   ? ?End-stage renal disease ? ?POSTOP DIAGNOSIS:   ? ?Same ? ?PROCEDURE:  ?  ?Right for stage basilic vein transposition ? ?SURGEON: Judeth Cornfield. Scot Dock, MD ? ?ASSIST: Arlee Muslim, PA ? ?ANESTHESIA: Axillary block with local ? ?EBL: Minimal ? ?INDICATIONS:  ? ? DANNETTE KINKAID is a 61 y.o. female who presents for new access.  She has had a previous brachiocephalic fistula on the right which is occluded.  Her only remaining option for a fistula in the right arm was a basilic vein transposition which I elected to do in 2 stages.  Of note she has a high bifurcation of the brachial artery bilaterally with small arteries and veins. ? ?FINDINGS:  ? ?The patient was taken to the operating room and I looked at the basilic vein myself with the SonoSite and I felt it was reasonable for a basilic vein transposition.  The ulnar artery was the larger of the 2 arteries and this was patent.  It was about a 2-1/2 mm artery.  The vein was 2-1/2 mm also. ? ?TECHNIQUE:  ? ?The patient was taken to the operating room after an axillary block was placed.  The right arm was prepped and draped in usual sterile fashion.  An oblique incision was made above the antecubital level and here the basilic vein was dissected free.  Branches were divided tween clips and 3-0 silk ties.  The ulnar artery was dissected free beneath the fascia.  The patient was heparinized.  The vein was mobilized over for anastomosis to the ulnar artery after it was ligated distally and irrigated up with heparinized saline.  The ulnar artery was clamped proximally and distally and a longitudinal arteriotomy was made.  The vein was spatulated and sewn end-to-side to the artery using continuous 6-0 Prolene suture.  At the completion there was a good thrill in the fistula and a radial and ulnar signal with the Doppler.  She is at increased risk for  steal given the small size of her arteries in her high brachial artery bifurcation.  Hemostasis was obtained of the wound.  The wound was closed with a deep layer of 3-0 Vicryl and the skin closed with 4-0 Monocryl.  Dermabond was applied.  The patient tolerated the procedure well and was transferred to the recovery room in stable condition.  All needle and sponge counts were correct. ? ?Given the complexity of the case a first assistant was necessary in order to expedient the procedure and safely perform the technical aspects of the operation. ? ?[name], MD, FACS ?[facility] ? ?DATE OF DICTATION:   02/19/2022 ? ?

## 2022-02-20 ENCOUNTER — Encounter (HOSPITAL_COMMUNITY): Payer: Self-pay | Admitting: Vascular Surgery

## 2022-02-20 DIAGNOSIS — N049 Nephrotic syndrome with unspecified morphologic changes: Secondary | ICD-10-CM | POA: Diagnosis not present

## 2022-02-20 LAB — GLUCOSE, CAPILLARY
Glucose-Capillary: 99 mg/dL (ref 70–99)
Glucose-Capillary: 110 mg/dL — ABNORMAL HIGH (ref 70–99)
Glucose-Capillary: 130 mg/dL — ABNORMAL HIGH (ref 70–99)
Glucose-Capillary: 252 mg/dL — ABNORMAL HIGH (ref 70–99)

## 2022-02-20 LAB — RENAL FUNCTION PANEL
Albumin: 2.9 g/dL — ABNORMAL LOW (ref 3.5–5.0)
Anion gap: 11 (ref 5–15)
BUN: 57 mg/dL — ABNORMAL HIGH (ref 8–23)
CO2: 26 mmol/L (ref 22–32)
Calcium: 8.7 mg/dL — ABNORMAL LOW (ref 8.9–10.3)
Chloride: 97 mmol/L — ABNORMAL LOW (ref 98–111)
Creatinine, Ser: 5.54 mg/dL — ABNORMAL HIGH (ref 0.44–1.00)
GFR, Estimated: 8 mL/min — ABNORMAL LOW (ref >60)
Glucose, Bld: 117 mg/dL — ABNORMAL HIGH (ref 70–99)
Phosphorus: 6.4 mg/dL — ABNORMAL HIGH (ref 2.5–4.6)
Potassium: 4.6 mmol/L (ref 3.5–5.1)
Sodium: 134 mmol/L — ABNORMAL LOW (ref 135–145)

## 2022-02-20 LAB — CBC
HCT: 25 % — ABNORMAL LOW (ref 36.0–46.0)
Hemoglobin: 8 g/dL — ABNORMAL LOW (ref 12.0–15.0)
MCH: 28.8 pg (ref 26.0–34.0)
MCHC: 32 g/dL (ref 30.0–36.0)
MCV: 89.9 fL (ref 80.0–100.0)
Platelets: 263 10*3/uL (ref 150–400)
RBC: 2.78 MIL/uL — ABNORMAL LOW (ref 3.87–5.11)
RDW: 13.1 % (ref 11.5–15.5)
WBC: 13.8 10*3/uL — ABNORMAL HIGH (ref 4.0–10.5)
nRBC: 0 % (ref 0.0–0.2)

## 2022-02-20 MED ORDER — LIDOCAINE-PRILOCAINE 2.5-2.5 % EX CREA
1.0000 "application " | TOPICAL_CREAM | CUTANEOUS | Status: DC | PRN
  Filled 2022-02-20: qty 5

## 2022-02-20 MED ORDER — PENTAFLUOROPROP-TETRAFLUOROETH EX AERO: 1.0000 "application " | INHALATION_SPRAY | CUTANEOUS | Status: DC | PRN

## 2022-02-20 MED ORDER — CHLORHEXIDINE GLUCONATE CLOTH 2 % EX PADS
6.0000 | MEDICATED_PAD | Freq: Every day | CUTANEOUS | Status: DC
  Administered 2022-02-20 – 2022-02-21 (×2): 6 via TOPICAL

## 2022-02-20 MED ORDER — ALTEPLASE 2 MG IJ SOLR: 2.0000 mg | Freq: Once | INTRAMUSCULAR | Status: DC | PRN

## 2022-02-20 MED ORDER — SODIUM CHLORIDE 0.9 % IV SOLN: 100.0000 mL | INTRAVENOUS | Status: DC | PRN

## 2022-02-20 MED ORDER — HEPARIN SODIUM (PORCINE) 1000 UNIT/ML DIALYSIS
1000.0000 [IU] | INTRAMUSCULAR | Status: DC | PRN
  Administered 2022-02-20: 1000 [IU] via INTRAVENOUS_CENTRAL

## 2022-02-20 MED ORDER — LIDOCAINE HCL (PF) 1 % IJ SOLN: 5.0000 mL | INTRAMUSCULAR | Status: DC | PRN

## 2022-02-20 NOTE — Evaluation (Signed)
Physical Therapy Evaluation ?Patient Details ?Name: Nancy [name] ?MRN: [id number] ?DOB: [birth year] ?Today's Date: 02/20/2022 ? ?History of Present Illness ? Nancy [name] is a 61 y.o. female presented to ED with a complaint of bilateral leg swelling.  Per patient, she has been taking her diuretics as prescribed and she has been making urine as well however she has noticed gradually worsening bilateral lower extremity swelling for past couple of weeks.  She has also noticed to have some exertional shortness of breath but no shortness of breath at rest. New to HD; with medical history significant of CKD stage V, hypertension, type 2 diabetes mellitus, CAD, chronic anemia, depression and many other comorbidities  ?Clinical Impression ?  ?Pt admitted with above diagnosis. Lives at home with husband who works, in a single-level home with a ramped entrance; Prior to admission, pt was able to functiona at [employer] level, husband helps her walk to the shower and assists with the shower; He works, and sets pt up with [facility] and all of her needs close to her recliner, where she stays while he is at work; Therapist, sports to [employer] with RUE pain/tenderness due to recent surgery for HD access, generalized weakness;  Pt currently with functional limitations due to the deficits listed below (see PT Problem List). Pt will benefit from skilled PT to increase their independence and safety with mobility to allow discharge to the venue listed below.     ? ?She very much wants to dc home as opposed to going to post-acute rehab; for home, recommend getting a regular HHAide; Also recommend setting up with medical Lucianne Lei transport to and from HD, and whatever ADL equipment OT recommends; HHPT/OT would be ideal, but will need to find out if her current insurance covers. ?   ? ?**Please consider having pt undergo HD in the HD recliner** ?   ? ?Recommendations for follow up therapy are one component of a multi-disciplinary discharge  planning process, led by the attending physician.  Recommendations may be updated based on patient status, additional functional criteria and insurance authorization. ? ?Follow Up Recommendations Home health PT ? ?  ?Assistance Recommended at Discharge PRN (Setup assist)  ?Patient can return home with the following ? A little help with walking and/or transfers;Assistance with cooking/housework;Help with stairs or ramp for entrance ? ?  ?Equipment Recommendations Rolling walker (2 wheels)  ?Recommendations for Other Services ? OT consult (as ordered)  ?  ?Functional Status Assessment Patient has had a recent decline in their functional status and demonstrates the ability to make significant improvements in function in a reasonable and predictable amount of time.  ? ?  ?Precautions / Restrictions Precautions ?Precautions: Fall ?Precaution Comments: New RUE first stage HD access done; OK to use lightly, no heavy lifting, no constrictive clothing; Fall risk present, but on the low side  ? ?  ? ?Mobility ? Bed Mobility ?Overal bed mobility: Needs Assistance ?Bed Mobility: Supine to Sit ?  ?  ?Supine to sit: Mod assist, HOB elevated ?  ?  ?General bed mobility comments: light mod assist to square off hips at EOB gettign up and to assist LEs into bed to lay down; Typically sleeps in recliner, so bed mobility here is not pivotal to dc home ?  ? ?Transfers ?Overall transfer level: Needs assistance ?Equipment used: Straight cane, 1 person hand held assist ?Transfers: Sit to/from Stand, Bed to chair/wheelchair/BSC ?Sit to Stand: Mod assist ?  ?Step pivot transfers: Mod assist ?  ?  ?  ?  General transfer comment: Light mod assist to power up and stabilize ?  ? ?Ambulation/Gait ?  ?  ?  ?  ?  ?  ?  ?  ? ?Stairs ?  ?  ?  ?  ?  ? ?Wheelchair Mobility ?  ? ?Modified Rankin (Stroke Patients Only) ?  ? ?  ? ?Balance Overall balance assessment: Needs assistance ?  ?Sitting balance-Leahy Scale: Fair ?  ?  ?  ?Standing balance-Leahy  Scale: Poor ?  ?  ?  ?  ?  ?  ?  ?  ?  ?  ?  ?  ?   ? ? ? ?Pertinent Vitals/Pain Pain Assessment ?Pain Assessment: 0-10 ?Pain Score: 5  ?Pain Location: R UE ?Pain Descriptors / Indicators: Aching, Grimacing, Guarding ?Pain Intervention(s): Monitored during session  ? ? ?Home Living Family/patient expects to be discharged to:: Private residence ?Living Arrangements: Spouse/significant other ?Available Help at Discharge: Family;Available PRN/intermittently ?Type of Home: Apartment ?Home Access: Level entry ?  ?  ?  ?Home Layout: One level ?Home Equipment: Rolling Walker (2 wheels);BSC/3in1;Shower seat;Cane - single point;Wheelchair - manual ?Additional Comments: husband sets her up with [facility], food and phone while he is at work  ?  ?Prior Function Prior Level of Function : Needs assist ?  ?  ?  ?  ?  ?  ?Mobility Comments: reports walking to the bathroom to shower with husband's assist, otherwise transfers only from [facility] to w/c and recliner; stays in recliner majority of day while husband at work ?ADLs Comments: assisted for showering and LB dressing, all IADL, does not drive ?  ? ? ?Hand Dominance  ? Dominant Hand: Right ? ?  ?Extremity/Trunk Assessment  ? Upper Extremity Assessment ?Upper Extremity Assessment: Defer to OT evaluation (RUE sore from new first stage HD access near antecubital space) ?  ? ?Lower Extremity Assessment ?Lower Extremity Assessment: Generalized weakness (Dependent on UEs to pull/push to stand) ?  ? ?   ?Communication  ? Communication: No difficulties (but not very talkative on PT eval)  ?Cognition Arousal/Alertness: Awake/alert ?Behavior During Therapy: [facility] for tasks assessed/performed ?Overall Cognitive Status: Within Functional Limits for tasks assessed ?  ?  ?  ?  ?  ?  ?  ?  ?  ?  ?  ?  ?  ?  ?  ?  ?  ?  ?  ? ?  ?General Comments General comments (skin integrity, edema, etc.): Adjsuted sling for better fit ? ?  ?Exercises    ? ?Assessment/Plan  ?  ?PT Assessment Patient needs continued PT  services  ?PT Problem List Decreased strength;Decreased activity tolerance;Decreased balance;Decreased mobility;Decreased knowledge of use of DME;Pain ? ?   ?  ?PT Treatment Interventions DME instruction;Gait training;Functional mobility training;Therapeutic activities;Therapeutic exercise;Balance training;Patient/family education   ? ?PT Goals (Current goals can be found in the Care Plan section)  ?Acute Rehab PT Goals ?Patient Stated Goal: Wants to tranistion home when leaving the hospital, not to a SNF ?PT Goal Formulation: With patient ?Time For Goal Achievement: 03/06/22 ?Potential to Achieve Goals: Good ? ?  ?Frequency Min 3X/week ?  ? ? ?Co-evaluation   ?  ?  ?  ?  ? ? ?  ?AM-PAC PT "6 Clicks" Mobility  ?Outcome Measure Help needed turning from your back to your side while in a flat bed without using bedrails?: A Little ?Help needed moving from lying on your back to sitting on the side of a flat bed without  using bedrails?: A Lot ?Help needed moving to and from a bed to a chair (including a wheelchair)?: A Lot ?Help needed standing up from a chair using your arms (e.g., wheelchair or bedside chair)?: A Lot ?Help needed to walk in hospital room?: A Lot ?Help needed climbing 3-5 steps with a railing? : Total ?6 Click Score: 12 ? ?  ?End of Session   ?Activity Tolerance: Patient tolerated treatment well ?Patient left: in bed;with call bell/phone within reach;with bed alarm set (bed in semi-chair position) ?Nurse Communication: Mobility status ?PT Visit Diagnosis: Unsteadiness on feet (R26.81);Other abnormalities of gait and mobility (R26.89);Muscle weakness (generalized) (M62.81);Pain ?Pain - Right/Left: Right ?Pain - part of body: Arm ?  ? ?Time: 8003-4917 ?PT Time Calculation (min) (ACUTE ONLY): 22 min ? ? ?Charges:   PT Evaluation ?$PT Eval Moderate Complexity: 1 Mod ?  ?  ?   ? ? ?Roney Marion, PT  ?Acute Rehabilitation Services ?Pager [phone] ?Office [phone] ? ? ?[place] ?02/20/2022, 9:42 AM ? ?

## 2022-02-20 NOTE — Evaluation (Signed)
Occupational Therapy Evaluation Patient Details Name: Nancy [name] MRN: [id number] DOB: 22-Sep-1961 Today's Date: 02/20/2022   History of Present Illness Nancy [name] is a 61 y.o. female presented to ED with a complaint of bilateral leg swelling.  Per patient, she has been taking her diuretics as prescribed and she has been making urine as well however she has noticed gradually worsening bilateral lower extremity swelling for past couple of weeks.  She has also noticed to have some exertional shortness of breath but no shortness of breath at rest. New to HD; with medical history significant of CKD stage V, hypertension, type 2 diabetes mellitus, CAD, chronic anemia, depression and many other comorbidities   Clinical Impression    Pt admitted as above with problems/deficits as listed below. PTA, pt husband performed homemaking/IADL's, pt was set-up assist from her husband meals/meal prep, she performs transfers from her manual w/c or recliner to her 3:1. Will follow acutely for OT to assist in maximizing independence with ADL and self care tasks. Pt will need frequent supervision/assistance at d/c. If family is unable to provide, would recommend SNF Rehab.     Recommendations for follow up therapy are one component of a multi-disciplinary discharge planning process, led by the attending physician.  Recommendations may be updated based on patient status, additional functional criteria and insurance authorization.   Follow Up Recommendations  Home health OT    Assistance Recommended at Discharge Frequent or constant Supervision/Assistance  Patient can return home with the following A little help with walking and/or transfers;A little help with bathing/dressing/bathroom;Assistance with cooking/housework;Assist for transportation;Direct supervision/assist for medications management    Functional Status Assessment  Patient has had a recent decline in their functional status and  demonstrates the ability to make significant improvements in function in a reasonable and predictable amount of time.  Equipment Recommendations  Other (comment) (Continue to assess in functional setting)    Recommendations for Other Services       Precautions / Restrictions Precautions Precautions: Fall Precaution Comments: New RUE first stage HD access done; OK to use lightly, no heavy lifting, no constrictive clothing; Fall risk present, but on the low side      Mobility Bed Mobility Overal bed mobility: Needs Assistance Bed Mobility: Supine to Sit     Supine to sit: Mod assist     General bed mobility comments: light mod assist to square off hips at EOB gettign up and to assist LEs into bed to lay down; Typically sleeps in recliner, so bed mobility here is not pivotal to dc home    Transfers Overall transfer level: Needs assistance Equipment used: Straight cane, 1 person hand held assist Transfers: Sit to/from Stand, Bed to chair/wheelchair/BSC Sit to Stand: Mod assist     Step pivot transfers: Mod assist     General transfer comment: Light mod assist to power up and stabilize      Balance Overall balance assessment: Needs assistance Sitting-balance support: No upper extremity supported Sitting balance-Leahy Scale: Fair Sitting balance - Comments: Pt sitting EOB for UB grooming and bathing     Standing balance-Leahy Scale: Poor     ADL either performed or assessed with clinical judgement   ADL Overall ADL's : Needs assistance/impaired Eating/Feeding: Set up;Modified independent   Grooming: Wash/dry hands;Wash/dry face;Oral care;Set up;Sitting Grooming Details (indicate cue type and reason): Sitting up at EOB Upper Body Bathing: Set up;Modified independent;Sitting Upper Body Bathing Details (indicate cue type and reason): Min A to wash/dry her back Lower  Body Bathing: Minimal assistance;Sitting/lateral leans;Sit to/from stand   Upper Body Dressing : Set  up;Modified independent;Sitting   Lower Body Dressing: Minimal assistance;Moderate assistance;Sit to/from stand;Sitting/lateral leans   Toilet Transfer: Moderate assistance;BSC/3in1;Requires wide/bariatric Armed forces technical officer Details (indicate cue type and reason): Simulated Toileting- Clothing Manipulation and Hygiene: Moderate assistance;Sit to/from stand;Sitting/lateral lean     General ADL Comments: Pt is currently Min - Mod A for functional mobility and transfers related to ADL's. She sat at EOB for groomig nad UB bathing with set-up today. Recommend SNF Rehab however pt declines and plans to d/c home. PTA, pt was set-up assist from her husband for meals, she performs transfers from her manula w/c or recliner to her 3:1. Will focus on this for acute OT - overall goals will be Mod I transfers from w/c or chair - 3:1 and set-up assist UB ADL's.     Vision Baseline Vision/History: 1 Wears glasses (Wears glasses for reading only) Patient Visual Report: No change from baseline              Pertinent Vitals/Pain Pain Assessment Pain Assessment: 0-10 Pain Score: 7  Pain Location: R UE Pain Descriptors / Indicators: Aching, Grimacing, Guarding Pain Intervention(s): Limited activity within patient's tolerance, Monitored during session, Repositioned, Patient requesting pain meds-RN notified, RN gave pain meds during session     Hand Dominance Right   Extremity/Trunk Assessment Upper Extremity Assessment Upper Extremity Assessment: RUE deficits/detail;Generalized weakness RUE Deficits / Details: New RUE first stage HD access done; OK to use lightly, no heavy lifting, no constrictive clothing. Wearing sling RUE: Unable to fully assess due to immobilization (Sling)   Lower Extremity Assessment Lower Extremity Assessment: Defer to PT evaluation;Generalized weakness (Dependent on UE to pull/push to stand)       Communication Communication Communication: No difficulties (Not very talkative  during OT assessment, soft spoken)   Cognition Arousal/Alertness: Awake/alert Behavior During Therapy: WFL for tasks assessed/performed Overall Cognitive Status: Within Functional Limits for tasks assessed       General Comments  Adjusted sling R UE for better fit/comfort            Home Living Family/patient expects to be discharged to:: Private residence Living Arrangements: Spouse/significant other Available Help at Discharge: Family;Available PRN/intermittently Type of Home: Apartment Home Access: Level entry     Home Layout: One level     Bathroom Shower/Tub: Occupational psychologist: Handicapped height Bathroom Accessibility: Yes   Home Equipment: Conservation officer, nature (2 wheels);BSC/3in1;Shower seat;Cane - single point;Wheelchair - manual   Additional Comments: husband sets her up with BSC, food and phone while he is at work      Prior Functioning/Environment Prior Level of Function : Needs assist   Mobility Comments: reports walking to the bathroom to shower with husband's assist, otherwise transfers only from [facility] to w/c and recliner; stays in recliner majority of day while husband at work ADLs Comments: assisted for showering and LB dressing, all IADL, does not drive    OT Problem List: Decreased activity tolerance;Decreased knowledge of use of DME or AE      OT Treatment/Interventions: Self-care/ADL training;Patient/family education;Energy conservation;Therapeutic activities;DME and/or AE instruction    OT Goals(Current goals can be found in the care plan section) Acute Rehab OT Goals Patient Stated Goal: Go home, I don't want to go to Rehab OT Goal Formulation: With patient Time For Goal Achievement: 03/06/22 Potential to Achieve Goals: Good  OT Frequency: Min 2X/week       AM-PAC OT "  6 Clicks" Daily Activity     Outcome Measure Help from another person eating meals?: None Help from another person taking care of personal grooming?: A Little Help  from another person toileting, which includes using toliet, bedpan, or urinal?: A Little Help from another person bathing (including washing, rinsing, drying)?: A Lot Help from another person to put on and taking off regular upper body clothing?: A Little Help from another person to put on and taking off regular lower body clothing?: A Lot 6 Click Score: 17   End of Session Equipment Utilized During Treatment: Gait belt;Rolling walker (2 wheels) Nurse Communication: Patient requests pain meds  Activity Tolerance: Patient tolerated treatment well Patient left: in bed;with call bell/phone within reach;with bed alarm set  OT Visit Diagnosis: Unsteadiness on feet (R26.81);Muscle weakness (generalized) (M62.81)                Time: 9323-5573 OT Time Calculation (min): 22 min Charges:  OT General Charges $OT Visit: 1 Visit OT Evaluation $OT Eval Low Complexity: 1 Low OT Treatments $Self Care/Home Management : 8-22 mins  Alva Broxson Beth Dixon, OTR/L 02/20/2022, 11:02 AM

## 2022-02-20 NOTE — Progress Notes (Signed)
[facility] ?ROUNDING NOTE  ? ?Subjective:  ? ?Interval History: 61 year old lady with new start end-stage renal disease.  History of hypertension diabetes.  Status post dialysis 02/16/2022 and 02/17/2022.  Next dialysis will be 02/20/2022.  She has an AV fistula to be placed Monday, 02/19/2022.  She is clipped to [facility]. ? ?Blood pressure 150/58 pulse 78 temperature 98.9 O2 sats 96% ? ?Pending ? ? ?Objective:  ?Vital signs in last 24 hours:  ?Temp:  [97.2 ?F (36.2 ?C)-98.9 ?F (37.2 ?C)] 98.9 ?F (37.2 ?C) (03/07 0416) ?Pulse Rate:  [66-79] 78 (03/07 0416) ?Resp:  [13-19] 19 (03/07 0416) ?BP: (110-160)/(54-79) 150/58 (03/07 0416) ?SpO2:  [93 %-100 %] 96 % (03/07 0416) ?Weight:  [95.9 kg-98.4 kg] 98.4 kg (03/07 0416) ? ?Weight change: 0 kg ?Filed Weights  ? 02/19/22 0500 02/19/22 0927 02/20/22 0416  ?Weight: 95.9 kg 95.9 kg 98.4 kg  ? ? ?Intake/Output: ?I/O last 3 completed shifts: ?In: 705 [P.O.:355; I.V.:350] ?Out: 252 [Urine:250; Emesis/NG output:2] ?  ?Intake/Output this shift: ? No intake/output data recorded. ? ?CVS- RRR ?RS- CTA ?ABD- BS present soft non-distended ?EXT-1+ lower extremity edema. ? ? ?Basic Metabolic Panel: ?Recent Labs  ?Lab 02/14/22 ?1500 02/14/22 ?1639 02/15/22 ?2836 02/15/22 ?1944 02/17/22 ?0749 02/19/22 ?0949  ?NA 131*  --  134* 134* 135 135  ?K 6.0*  --  4.8 4.5 4.2 4.2  ?CL 95*  --  102 98 96* 98  ?CO2 26  --   --  21* 29  --   ?GLUCOSE 97  --  53* 127* 117* 94  ?BUN 72*  --  67* 72* 49* 46*  ?CREATININE 6.03* 6.20* 7.30* 6.17* 5.03* 5.20*  ?CALCIUM 8.7*  --   --  9.0 8.4*  --   ?MG  --  2.7*  --   --   --   --   ?PHOS  --   --   --  6.5* 5.6*  --   ? ? ? ?Liver Function Tests: ?Recent Labs  ?Lab 02/14/22 ?1639 02/15/22 ?1944 02/17/22 ?0749  ?AST 13* 20  --   ?ALT 9 15  --   ?ALKPHOS 46 61  --   ?BILITOT 0.6 0.3  --   ?PROT 7.7 7.8  --   ?ALBUMIN 3.2* 3.2* 2.7*  ? ? ?No results for input(s): LIPASE, AMYLASE in the last 168 hours. ?No results for input(s): AMMONIA  in the last 168 hours. ? ?CBC: ?Recent Labs  ?Lab 02/14/22 ?1500 02/15/22 ?6294 02/15/22 ?7654 02/15/22 ?1944 02/16/22 ?0122 02/17/22 ?6503 02/19/22 ?0949  ?WBC 9.6  --  QUESTIONABLE RESULTS, RECOMMEND RECOLLECT TO VERIFY 9.2 9.4 11.0*  --   ?NEUTROABS 6.6  --   --   --   --   --   --   ?HGB 8.8*   < > QUESTIONABLE RESULTS, RECOMMEND RECOLLECT TO VERIFY 10.6* 8.6* 8.0* 9.5*  ?HCT 27.0*   < > QUESTIONABLE RESULTS, RECOMMEND RECOLLECT TO VERIFY 32.0* 26.1* 24.8* 28.0*  ?MCV 88.5  --  QUESTIONABLE RESULTS, RECOMMEND RECOLLECT TO VERIFY 87.4 88.5 88.9  --   ?PLT 320  --  QUESTIONABLE RESULTS, RECOMMEND RECOLLECT TO VERIFY 314 297 284  --   ? < > = values in this interval not displayed.  ? ? ? ?Cardiac Enzymes: ?No results for input(s): CKTOTAL, CKMB, CKMBINDEX, TROPONINI in the last 168 hours. ? ?BNP: ?Invalid input(s): POCBNP ? ?CBG: ?Recent Labs  ?Lab 02/19/22 ?1144 02/19/22 ?1248 02/19/22 ?1659 02/19/22 ?2157 02/20/22 ?5465  ?  GLUCAP 120* 115* 130* 110* 99  ? ? ? ?Microbiology: ?Results for orders placed or performed during the hospital encounter of 02/14/22  ?Resp Panel by RT-PCR (Flu A&B, Covid) Nasopharyngeal Swab     Status: None  ? Collection Time: 02/14/22  4:40 PM  ? Specimen: Nasopharyngeal Swab; Nasopharyngeal(NP) swabs in vial transport medium  ?Result Value Ref Range Status  ? SARS Coronavirus 2 by RT PCR NEGATIVE NEGATIVE Final  ?  Comment: (NOTE) ?SARS-CoV-2 target nucleic acids are NOT DETECTED. ? ?The SARS-CoV-2 RNA is generally detectable in upper respiratory ?specimens during the acute phase of infection. The lowest ?concentration of SARS-CoV-2 viral copies this assay can detect is ?138 copies/mL. A negative result does not preclude SARS-Cov-2 ?infection and should not be used as the sole basis for treatment or ?other patient management decisions. A negative result may occur with  ?improper specimen collection/handling, submission of specimen other ?than nasopharyngeal swab, presence of viral  mutation(s) within the ?areas targeted by this assay, and inadequate number of viral ?copies(<138 copies/mL). A negative result must be combined with ?clinical observations, patient history, and epidemiological ?information. The expected result is Negative. ? ?Fact Sheet for Patients:  ?EntrepreneurPulse.com.au ? ?Fact Sheet for Healthcare Providers:  ?IncredibleEmployment.be ? ?This test is no t yet approved or cleared by the [place] FDA and  ?has been authorized for detection and/or diagnosis of SARS-CoV-2 by ?FDA under an Emergency Use Authorization (EUA). This EUA will remain  ?in effect (meaning this test can be used) for the duration of the ?COVID-19 declaration under Section 564(b)(1) of the Act, 21 ?U.S.C.section 360bbb-3(b)(1), unless the authorization is terminated  ?or revoked sooner.  ? ? ?  ? Influenza A by PCR NEGATIVE NEGATIVE Final  ? Influenza B by PCR NEGATIVE NEGATIVE Final  ?  Comment: (NOTE) ?The Xpert Xpress SARS-CoV-2/FLU/RSV plus assay is intended as an aid ?in the diagnosis of influenza from Nasopharyngeal swab specimens and ?should not be used as a sole basis for treatment. Nasal washings and ?aspirates are unacceptable for Xpert Xpress SARS-CoV-2/FLU/RSV ?testing. ? ?Fact Sheet for Patients: ?EntrepreneurPulse.com.au ? ?Fact Sheet for Healthcare Providers: ?IncredibleEmployment.be ? ?This test is not yet approved or cleared by the [place] FDA and ?has been authorized for detection and/or diagnosis of SARS-CoV-2 by ?FDA under an Emergency Use Authorization (EUA). This EUA will remain ?in effect (meaning this test can be used) for the duration of the ?COVID-19 declaration under Section 564(b)(1) of the Act, 21 U.S.C. ?section 360bbb-3(b)(1), unless the authorization is terminated or ?revoked. ? ?Performed at [facility], [place] [address], [place], [place] ?[address] ?  ? ? ?Coagulation Studies: ?No results  for input(s): LABPROT, INR in the last 72 hours. ? ?Urinalysis: ?No results for input(s): COLORURINE, LABSPEC, [place], GLUCOSEU, HGBUR, BILIRUBINUR, KETONESUR, PROTEINUR, UROBILINOGEN, NITRITE, LEUKOCYTESUR in the last 72 hours. ? ?Invalid input(s): APPERANCEUR  ? ? ?Imaging: ?No results found. ? ? ?Medications:  ? ? ferric gluconate (FERRLECIT) IVPB 125 mg (02/17/22 1021)  ? ? amLODipine  10 mg Oral Daily  ? aspirin EC  81 mg Oral Daily  ? atorvastatin  40 mg Oral Daily  ? calcium acetate  667 mg Oral TID WC  ? carvedilol  18.75 mg Oral BID WC  ? cephALEXin  500 mg Oral Q12H  ? Chlorhexidine Gluconate Cloth  6 each Topical Q0600  ? collagenase   Topical BID  ? darbepoetin (ARANESP) injection - DIALYSIS  40 mcg Intravenous Q Sat-HD  ? furosemide  60 mg Intravenous Once  ? heparin  5,000 Units Subcutaneous Q8H  ? insulin aspart  0-15 Units Subcutaneous TID WC  ? insulin aspart  0-5 Units Subcutaneous QHS  ? insulin glargine-yfgn  15 Units Subcutaneous QHS  ? risperiDONE  3 mg Oral QHS  ? ?acetaminophen, cloNIDine, heparin sodium (porcine), mineral oil-hydrophilic petrolatum, ondansetron **OR** ondansetron (ZOFRAN) IV, oxyCODONE-acetaminophen ? ?Assessment/ Plan:  ?ESRD-new start secondary to diabetes.  Looks like patient has been clipped to [facility].  She is scheduled for AV fistula 02/19/2022.  She will get dialysis 02/20/2022 ?ANEMIA-iron and darbepoetin initiated. 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 ?HTN/VOL-improving with ultrafiltration ?ACCESS-AV fistula to be placed 02/19/2022. ? ? ? LOS: 6 ?Sherril Croon ?'@TODAY''@7'$ :33 AM ?  ?

## 2022-02-20 NOTE — Progress Notes (Signed)
Tele reporting HR 35. Upon arrival to room found patient awake but drowsy, vomitting. BP 127/59, HR 70. Drowsy, speech clear, O x 4. Dr Doristine Bosworth notified. Most recent Percocet at [phone].  ?

## 2022-02-20 NOTE — Progress Notes (Signed)
PROGRESS NOTE    Nancy [name]  DPO:[id number] DOB: November 17, 1961 DOA: 02/14/2022 PCP: [name], MD   Brief Narrative:  Nancy [name] is a 61 y.o. female with medical history significant of CKD stage V, hypertension, type 2 diabetes mellitus, CAD, chronic anemia, depression and many other comorbidities  presented to ED with a complaint of bilateral leg swelling despite of being able to make urine. She has also noticed to have some exertional shortness of breath but no shortness of breath at rest. patient had graft placement in right arm on 12/22/2021 however that failed to mature.  She was admitted for the need of new hemodialysis.  Assessment & Plan:   Principal Problem:   Renal anasarca Active Problems:   Acute on chronic diastolic CHF (congestive heart failure) (HCC)   ESRD (end stage renal disease) (HCC)   Hyperkalemia   Pressure injury of skin  Renal anasarca/progressively worsening CKD stage V, now ESRD: TDC was placed by IR on 01/18/2022 and she underwent her first dialysis as well.  Her right upper extremity BC AVF failed to mature.  She was seen by vascular surgery and underwent right basilic vein transposition on 02/19/2022.  Seen by vascular surgery, doing well from their perspective and she is cleared for discharge.  She is going to have her dialysis today.  She is already clipped.  PT OT recommends home health.  TOC working on that and having some difficulty due to patient being medicated.  Husband not picking up the phone.  Acute on chronic diastolic congestive heart failure: Volume managed by HD   Hyperkalemia: Resolved.   Type 2 diabetes mellitus: She appears to be taking Lantus 30 units.  However she is on 15 units here and her blood sugar is controlled.  Continue SSI.   Essential hypertension: Controlled.  Continue home medications.   Morbid obesity: Weight loss counseling provided.   HLD: Continue atorvastatin.   History of CAD: Asymptomatic.  Continue  aspirin and Coreg.  Unstageable bilateral heel pressure wounds: Wound care on board.  DVT prophylaxis: heparin injection 5,000 Units Start: 02/14/22 2200   Code Status: Full Code  Family Communication:  None present at bedside.  Plan of care discussed with patient in length and he/she verbalized understanding and agreed with it.  Status is: Inpatient Remains inpatient appropriate because: Needs dialysis today.  Plan for potential discharge tomorrow once home health is arranged.  Estimated body mass index is 38.43 kg/m as calculated from the following:   Height as of this encounter: '5\' 3"'$  (1.6 m).   Weight as of this encounter: 98.4 kg.  Pressure Injury 02/15/22 Heel Left Unstageable - Full thickness tissue loss in which the base of the injury is covered by slough (yellow, tan, gray, green or brown) and/or eschar (tan, brown or black) in the wound bed. (Active)  02/15/22 1856  Location: Heel  Location Orientation: Left  Staging: Unstageable - Full thickness tissue loss in which the base of the injury is covered by slough (yellow, tan, gray, green or brown) and/or eschar (tan, brown or black) in the wound bed.  Wound Description (Comments):   Present on Admission: Yes     Pressure Injury 02/18/22 Heel Right Stage 3 -  Full thickness tissue loss. Subcutaneous fat may be visible but bone, tendon or muscle are NOT exposed. (Active)  02/18/22 1835  Location: Heel  Location Orientation: Right  Staging: Stage 3 -  Full thickness tissue loss. Subcutaneous fat may be visible but  bone, tendon or muscle are NOT exposed.  Wound Description (Comments):   Present on Admission:    Nutritional Assessment: Body mass index is 38.43 kg/m.Marland Kitchen Seen by dietician.  I agree with the assessment and plan as outlined below: Nutrition Status:        . Skin Assessment: I have examined the patient's skin and I agree with the wound assessment as performed by the wound care RN as outlined below: Pressure  Injury 02/15/22 Heel Left Unstageable - Full thickness tissue loss in which the base of the injury is covered by slough (yellow, tan, gray, green or brown) and/or eschar (tan, brown or black) in the wound bed. (Active)  02/15/22 1856  Location: Heel  Location Orientation: Left  Staging: Unstageable - Full thickness tissue loss in which the base of the injury is covered by slough (yellow, tan, gray, green or brown) and/or eschar (tan, brown or black) in the wound bed.  Wound Description (Comments):   Present on Admission: Yes     Pressure Injury 02/18/22 Heel Right Stage 3 -  Full thickness tissue loss. Subcutaneous fat may be visible but bone, tendon or muscle are NOT exposed. (Active)  02/18/22 1835  Location: Heel  Location Orientation: Right  Staging: Stage 3 -  Full thickness tissue loss. Subcutaneous fat may be visible but bone, tendon or muscle are NOT exposed.  Wound Description (Comments):   Present on Admission:     Consultants:  Nephrology IR Vascular surgery Procedures:  As above  Antimicrobials:  Anti-infectives (From admission, onward)    Start     Dose/Rate Route Frequency Ordered Stop   02/19/22 1015  ceFAZolin (ANCEF) 2,000 mg in dextrose 5 % 100 mL IVPB  Status:  Discontinued        2,000 mg 240 mL/hr over 30 Minutes Intravenous  Once 02/19/22 1004 02/19/22 1010   02/19/22 1015  ceFAZolin (ANCEF) IVPB 2g/100 mL premix  Status:  Discontinued        2 g 200 mL/hr over 30 Minutes Intravenous  Once 02/19/22 1011 02/19/22 1254   02/19/22 1005  ceFAZolin (ANCEF) 2-4 GM/100ML-% IVPB       Note to Pharmacy: Roosvelt Maser N: cabinet override      02/19/22 1005 02/19/22 2214   02/15/22 1300  ceFAZolin (ANCEF) IVPB 2g/100 mL premix        2 g 200 mL/hr over 30 Minutes Intravenous To Radiology 02/15/22 1224 02/15/22 1416   02/15/22 0800  ceFAZolin (ANCEF) IVPB 1 g/50 mL premix  Status:  Discontinued        1 g 100 mL/hr over 30 Minutes Intravenous To Radiology  02/15/22 0746 02/15/22 1224   02/14/22 2200  cephALEXin (KEFLEX) capsule 500 mg        500 mg Oral Every 12 hours 02/14/22 1747           Subjective:  Seen and examined.  She has no complaints.  Objective: Vitals:   02/20/22 1230 02/20/22 1300 02/20/22 1330 02/20/22 1400  BP: (!) 99/55 (!) 129/59 (!) 108/55 (!) 100/54  Pulse: 62 61 60 60  Resp: (!) 6 (!) 8 (!) 8 10  Temp:      TempSrc:      SpO2:      Weight:      Height:        Intake/Output Summary (Last 24 hours) at 02/20/2022 1406 Last data filed at 02/19/2022 1826 Gross per 24 hour  Intake --  Output 251 ml  Net -251 ml    Filed Weights   02/19/22 0927 02/20/22 0416 02/20/22 1200  Weight: 95.9 kg 98.4 kg 98.4 kg    Examination:  General exam: Appears calm and comfortable, obese Respiratory system: Clear to auscultation. Respiratory effort normal. Cardiovascular system: S1 & S2 heard, RRR. No JVD, murmurs, rubs, gallops or clicks.  Trace pitting edema bilateral lower extremity Gastrointestinal system: Abdomen is nondistended, soft and nontender. No organomegaly or masses felt. Normal bowel sounds heard. Central nervous system: Alert and oriented. No focal neurological deficits. Extremities: Symmetric 5 x 5 power. Skin: No rashes, lesions or ulcers.  Psychiatry: Judgement and insight appear poor  Data Reviewed: I have personally reviewed following labs and imaging studies  CBC: Recent Labs  Lab 02/14/22 1500 02/15/22 0838 02/15/22 0846 02/15/22 1944 02/16/22 0122 02/17/22 0749 02/19/22 0949 02/20/22 1216  WBC 9.6  --  QUESTIONABLE RESULTS, RECOMMEND RECOLLECT TO VERIFY 9.2 9.4 11.0*  --  13.8*  NEUTROABS 6.6  --   --   --   --   --   --   --   HGB 8.8*   < > QUESTIONABLE RESULTS, RECOMMEND RECOLLECT TO VERIFY 10.6* 8.6* 8.0* 9.5* 8.0*  HCT 27.0*   < > QUESTIONABLE RESULTS, RECOMMEND RECOLLECT TO VERIFY 32.0* 26.1* 24.8* 28.0* 25.0*  MCV 88.5  --  QUESTIONABLE RESULTS, RECOMMEND RECOLLECT TO VERIFY 87.4  88.5 88.9  --  89.9  PLT 320  --  QUESTIONABLE RESULTS, RECOMMEND RECOLLECT TO VERIFY 314 297 284  --  263   < > = values in this interval not displayed.    Basic Metabolic Panel: Recent Labs  Lab 02/14/22 1500 02/14/22 1639 02/15/22 0838 02/15/22 1944 02/17/22 0749 02/19/22 0949 02/20/22 1216  NA 131*  --  134* 134* 135 135 134*  K 6.0*  --  4.8 4.5 4.2 4.2 4.6  CL 95*  --  102 98 96* 98 97*  CO2 26  --   --  21* 29  --  26  GLUCOSE 97  --  53* 127* 117* 94 117*  BUN 72*  --  67* 72* 49* 46* 57*  CREATININE 6.03* 6.20* 7.30* 6.17* 5.03* 5.20* 5.54*  CALCIUM 8.7*  --   --  9.0 8.4*  --  8.7*  MG  --  2.7*  --   --   --   --   --   PHOS  --   --   --  6.5* 5.6*  --  6.4*    GFR: Estimated Creatinine Clearance: 11.9 mL/min (A) (by C-G formula based on SCr of 5.54 mg/dL (H)). Liver Function Tests: Recent Labs  Lab 02/14/22 1639 02/15/22 1944 02/17/22 0749 02/20/22 1216  AST 13* 20  --   --   ALT 9 15  --   --   ALKPHOS 46 61  --   --   BILITOT 0.6 0.3  --   --   PROT 7.7 7.8  --   --   ALBUMIN 3.2* 3.2* 2.7* 2.9*    No results for input(s): LIPASE, AMYLASE in the last 168 hours. No results for input(s): AMMONIA in the last 168 hours. Coagulation Profile: No results for input(s): INR, PROTIME in the last 168 hours. Cardiac Enzymes: No results for input(s): CKTOTAL, CKMB, CKMBINDEX, TROPONINI in the last 168 hours. BNP (last 3 results) No results for input(s): PROBNP in the last 8760 hours. HbA1C: No results for input(s): HGBA1C in the last 72 hours.  CBG:  Recent Labs  Lab 02/19/22 1248 02/19/22 1659 02/19/22 2157 02/20/22 0728 02/20/22 1130  GLUCAP 115* 130* 110* 99 110*    Lipid Profile: No results for input(s): CHOL, HDL, LDLCALC, TRIG, CHOLHDL, LDLDIRECT in the last 72 hours. Thyroid Function Tests: No results for input(s): TSH, T4TOTAL, FREET4, T3FREE, THYROIDAB in the last 72 hours. Anemia Panel: No results for input(s): VITAMINB12, FOLATE,  FERRITIN, TIBC, IRON, RETICCTPCT in the last 72 hours.  Sepsis Labs: No results for input(s): PROCALCITON, LATICACIDVEN in the last 168 hours.  Recent Results (from the past 240 hour(s))  Resp Panel by RT-PCR (Flu A&B, Covid) Nasopharyngeal Swab     Status: None   Collection Time: 02/14/22  4:40 PM   Specimen: Nasopharyngeal Swab; Nasopharyngeal(NP) swabs in vial transport medium  Result Value Ref Range Status   SARS Coronavirus 2 by RT PCR NEGATIVE NEGATIVE Final    Comment: (NOTE) SARS-CoV-2 target nucleic acids are NOT DETECTED.  The SARS-CoV-2 RNA is generally detectable in upper respiratory specimens during the acute phase of infection. The lowest concentration of SARS-CoV-2 viral copies this assay can detect is 138 copies/mL. A negative result does not preclude SARS-Cov-2 infection and should not be used as the sole basis for treatment or other patient management decisions. A negative result may occur with  improper specimen collection/handling, submission of specimen other than nasopharyngeal swab, presence of viral mutation(s) within the areas targeted by this assay, and inadequate number of viral copies(<138 copies/mL). A negative result must be combined with clinical observations, patient history, and epidemiological information. The expected result is Negative.  Fact Sheet for Patients:  EntrepreneurPulse.com.au  Fact Sheet for Healthcare Providers:  IncredibleEmployment.be  This test is no t yet approved or cleared by the [place] FDA and  has been authorized for detection and/or diagnosis of SARS-CoV-2 by FDA under an Emergency Use Authorization (EUA). This EUA will remain  in effect (meaning this test can be used) for the duration of the COVID-19 declaration under Section 564(b)(1) of the Act, 21 U.S.C.section 360bbb-3(b)(1), unless the authorization is terminated  or revoked sooner.       Influenza A by PCR NEGATIVE  NEGATIVE Final   Influenza B by PCR NEGATIVE NEGATIVE Final    Comment: (NOTE) The Xpert Xpress SARS-CoV-2/FLU/RSV plus assay is intended as an aid in the diagnosis of influenza from Nasopharyngeal swab specimens and should not be used as a sole basis for treatment. Nasal washings and aspirates are unacceptable for Xpert Xpress SARS-CoV-2/FLU/RSV testing.  Fact Sheet for Patients: EntrepreneurPulse.com.au  Fact Sheet for Healthcare Providers: IncredibleEmployment.be  This test is not yet approved or cleared by the [place] FDA and has been authorized for detection and/or diagnosis of SARS-CoV-2 by FDA under an Emergency Use Authorization (EUA). This EUA will remain in effect (meaning this test can be used) for the duration of the COVID-19 declaration under Section 564(b)(1) of the Act, 21 U.S.C. section 360bbb-3(b)(1), unless the authorization is terminated or revoked.  Performed at [facility], [place] [address] [place], [place] [address]       Radiology Studies: No results found.  Scheduled Meds:  amLODipine  10 mg Oral Daily   aspirin EC  81 mg Oral Daily   atorvastatin  40 mg Oral Daily   calcium acetate  667 mg Oral TID WC   carvedilol  18.75 mg Oral BID WC   cephALEXin  500 mg Oral Q12H   Chlorhexidine Gluconate Cloth  6 each Topical Q0600  Chlorhexidine Gluconate Cloth  6 each Topical Q0600   collagenase   Topical BID   darbepoetin (ARANESP) injection - DIALYSIS  40 mcg Intravenous Q Sat-HD   furosemide  60 mg Intravenous Once   heparin  5,000 Units Subcutaneous Q8H   insulin aspart  0-15 Units Subcutaneous TID WC   insulin aspart  0-5 Units Subcutaneous QHS   insulin glargine-yfgn  15 Units Subcutaneous QHS   risperiDONE  3 mg Oral QHS   Continuous Infusions:  sodium chloride     sodium chloride     ferric gluconate (FERRLECIT) IVPB 125 mg (02/17/22 1021)     LOS: 6 days   Time spent: 22 minutes  Darliss Cheney, MD [facility]  02/20/2022, 2:06 PM  Please page via [place] [place] and do not message via secure chat for urgent patient care matters. Secure chat can be used for non urgent patient care matters.  How to contact the [facility] Attending or Consulting provider [place] or covering provider during after hours [place], for this patient?  Check the care team in [facility] and look for a) attending/consulting TRH provider listed and b) the [facility] team listed. Page or secure chat [phone]. Log into [email or web address] and use [place]'s universal password to access. If you do not have the password, please contact the hospital operator. Locate the [facility] provider you are looking for under [facility] and page to a number that you can be directly reached. If you still have difficulty reaching the provider, please page the [facility] (Director on Call) for the Hospitalists listed on amion for assistance.

## 2022-02-20 NOTE — Progress Notes (Signed)
? ?  VASCULAR SURGERY ASSESSMENT & PLAN:  ? ?POD 1 RIGHT FIRST STAGE BASILIC VEIN TRANSPOSITION: The patient has a bruit in her right upper arm fistula.  She has a brisk radial and ulnar signal with the Doppler.  She has no complaints of steal in the right hand.  We will arrange follow-up in 6 weeks to check on the maturation of the vein and consider second stage basilic vein transposition. ?Vascular surgery will be available as needed. ? ? ?SUBJECTIVE:  ? ?No specific complaints. ? ?PHYSICAL EXAM:  ? ?Vitals:  ? 02/19/22 1719 02/19/22 2155 02/20/22 0415 02/20/22 0416  ?BP: 125/61 140/79 (!) 150/58 (!) 150/58  ?Pulse: 73 70 79 78  ?Resp: '17 18  19  '$ ?Temp: 98.2 ?F (36.8 ?C) 98.5 ?F (36.9 ?C) 98.9 ?F (37.2 ?C) 98.9 ?F (37.2 ?C)  ?TempSrc:  Oral Oral Oral  ?SpO2: 95% 100% 96% 96%  ?Weight:    98.4 kg  ?Height:      ? ?Brisk radial and ulnar signal in the right wrist with the Doppler. ?The right upper arm fistula has an audible bruit. ?The incision looks fine. ? ?LABS:  ? ?Lab Results  ?Component Value Date  ? WBC 11.0 (H) 02/17/2022  ? HGB 9.5 (L) 02/19/2022  ? HCT 28.0 (L) 02/19/2022  ? MCV 88.9 02/17/2022  ? PLT 284 02/17/2022  ? ?CBG (last 3)  ?Recent Labs  ?  02/19/22 ?1659 02/19/22 ?2157 02/20/22 ?0728  ?GLUCAP 130* 110* 99  ? ? ?PROBLEM LIST:   ? ?Principal Problem: ?  Renal anasarca ?Active Problems: ?  Acute on chronic diastolic CHF (congestive heart failure) ([place]) ?  ESRD (end stage renal disease) ([place]) ?  Hyperkalemia ?  Pressure injury of skin ? ? ?CURRENT MEDS:  ? ? amLODipine  10 mg Oral Daily  ? aspirin EC  81 mg Oral Daily  ? atorvastatin  40 mg Oral Daily  ? calcium acetate  667 mg Oral TID WC  ? carvedilol  18.75 mg Oral BID WC  ? cephALEXin  500 mg Oral Q12H  ? Chlorhexidine Gluconate Cloth  6 each Topical Q0600  ? Chlorhexidine Gluconate Cloth  6 each Topical Q0600  ? collagenase   Topical BID  ? darbepoetin (ARANESP) injection - DIALYSIS  40 mcg Intravenous Q Sat-HD  ? furosemide  60 mg Intravenous  Once  ? heparin  5,000 Units Subcutaneous Q8H  ? insulin aspart  0-15 Units Subcutaneous TID WC  ? insulin aspart  0-5 Units Subcutaneous QHS  ? insulin glargine-yfgn  15 Units Subcutaneous QHS  ? risperiDONE  3 mg Oral QHS  ? ? ?[name] ?Office: [phone] ?02/20/2022 ? ?

## 2022-02-21 ENCOUNTER — Telehealth (HOSPITAL_COMMUNITY): Payer: Self-pay | Admitting: Licensed Clinical Social Worker

## 2022-02-21 ENCOUNTER — Other Ambulatory Visit (HOSPITAL_COMMUNITY): Payer: Self-pay

## 2022-02-21 ENCOUNTER — Telehealth (HOSPITAL_COMMUNITY): Payer: Self-pay

## 2022-02-21 ENCOUNTER — Ambulatory Visit: Payer: Medicaid Other | Admitting: Internal Medicine

## 2022-02-21 ENCOUNTER — Encounter (HOSPITAL_COMMUNITY): Payer: Self-pay

## 2022-02-21 DIAGNOSIS — N049 Nephrotic syndrome with unspecified morphologic changes: Secondary | ICD-10-CM | POA: Diagnosis not present

## 2022-02-21 LAB — GLUCOSE, CAPILLARY
Glucose-Capillary: 117 mg/dL — ABNORMAL HIGH (ref 70–99)
Glucose-Capillary: 187 mg/dL — ABNORMAL HIGH (ref 70–99)
Glucose-Capillary: 139 mg/dL — ABNORMAL HIGH (ref 70–99)

## 2022-02-21 MED ORDER — CHLORHEXIDINE GLUCONATE CLOTH 2 % EX PADS: 6.0000 | MEDICATED_PAD | Freq: Every day | CUTANEOUS | Status: DC

## 2022-02-21 MED ORDER — INSULIN GLARGINE 100 UNIT/ML SOLOSTAR PEN: 15.0000 [IU] | PEN_INJECTOR | Freq: Every day | SUBCUTANEOUS | 0 refills | Status: DC

## 2022-02-21 MED ORDER — INSULIN GLARGINE 100 UNIT/ML SOLOSTAR PEN
15.0000 [IU] | PEN_INJECTOR | Freq: Every day | SUBCUTANEOUS | 0 refills | Status: DC
  Filled 2022-02-21: qty 6, 40d supply, fill #0

## 2022-02-21 NOTE — Care Management Important Message (Signed)
Important Message ? ?Patient Details  ?Name: Nancy [name] ?MRN: [id number] ?Date of Birth: November 25, 1961 ? ? ?Medicare Important Message Given:  Yes ?Patient discharge prior to IM delivery will mail to the patient home address.  ? ? ? ?Arye Weyenberg ?02/21/2022, 3:28 PM ?

## 2022-02-21 NOTE — Telephone Encounter (Signed)
Patient DC'd from hospital today and went back to hotel where she has been staying with her spouse.  Community Paramedic went out to complete home visit and pt and spouse saying they do not feel as if they can manage pt at home and are wanting pt to go to SNF. ? ?CSW spoke with inpatient team and got information about SNFs that are sometimes willing to accept Medicaid. ? ?Spoke with admission reps for [facility] and [facility]- they are checking with supervisor and left message for [place] ? ?CSW will help to figure out next steps to hopefully get pt into placement.  PCP office involved helping to get fl2 for rehab if pt can be admitted somewhere. ? ?Will continue to follow and assist as needed ? ?Jorge Ny, LCSW ?Clinical Social Worker ?[facility] ?Desk#: [phone] ?Cell#: [phone] ? ?

## 2022-02-21 NOTE — TOC Transition Note (Signed)
Transition of Care (TOC) - CM/SW Discharge Note ? ? ?Patient Details  ?Name: Nancy [name] ?MRN: [id number] ?Date of Birth: Oct 12, 1961 ? ?Transition of Care (TOC) CM/SW Contact:  ?Tom-Johnson, Renea Ee, RN ?Phone Number: ?02/21/2022, 12:47 PM ? ? ?Clinical Narrative:    ? ?Patient is scheduled for discharge today. Home health PT/OT recommended. Referrals denied due to patient's Medicaid insurance. CM spoke with patient's husband, [name] and he states that he is patient's primary caregiver and makes sure everything is setup and reachable by patient's side before he leaves for work. States he comes home and check on patient everyday at his lunch break.  ?CM received a call from Salena Saner, [employer] Paramedic with Dr Claris Gladden office. States she helps with patient's appointments and medication and [name] gave her CM's number to get an update. CM told her patient was discharged, and that patient declined going to rehab. Heather voiced understanding.  ?[name] to transport patient at discharge. No further TOC needs noted.  ? ?Final next level of care: Home/Self Care ?Barriers to Discharge: Barriers Resolved ? ? ?Patient Goals and CMS Choice ?Patient states their goals for this hospitalization and ongoing recovery are:: To return home ?CMS Medicare.gov Compare Post Acute Care list provided to:: Patient ?Choice offered to / list presented to : NA ? ?Discharge Placement ?  ?           ?  ?  ?  ?  ? ?Discharge Plan and Services ?  ?Discharge Planning Services: CM Consult ?Post Acute Care Choice: NA          ?DME Arranged: N/A ?DME Agency: NA ?  ?  ?  ?HH Arranged: NA ?[place] Agency: Other - See comment (Unable to get [facility] due to her Medicaid insurance.) ?  ?  ?  ? ?Social Determinants of Health (SDOH) Interventions ?  ? ? ?Readmission Risk Interventions ?Readmission Risk Prevention Plan 02/16/2022 09/21/2021 03/07/2020  ?Transportation Screening Complete Complete Complete  ?PCP or Specialist Appt within 3-5 Days - -  Complete  ?[place] or Home Care Consult - - (No Data)  ?Social Work Consult for [place] Planning/Counseling - - Complete  ?Palliative Care Screening - - Not Applicable  ?Medication Review Press photographer) Complete Complete Complete  ?PCP or Specialist appointment within 3-5 days of discharge - Complete -  ?[place] or [place] - Complete -  ?SW Recovery Care/Counseling Consult - Complete -  ?Palliative Care Screening - Complete -  ?Skilled Nursing Facility - Not Applicable -  ?Some recent data might be hidden  ? ? ? ? ? ?

## 2022-02-21 NOTE — Progress Notes (Signed)
DISCHARGE NOTE HOME ?Nancy [name] to be discharged Home per MD order. Discussed prescriptions and follow up appointments with the patient. Prescriptions given to patient; medication list explained in detail. Patient verbalized understanding. ? ?Skin clean, dry and intact without evidence of skin break down, no evidence of skin tears noted. IV catheter discontinued intact. Site without signs and symptoms of complications. Dressing and pressure applied. Pt denies pain at the site currently. No complaints noted. ? ?Patient free of lines, drains, and wounds.  ? ?An After Visit Summary (AVS) was printed and given to the patient. ?Patient escorted via wheelchair, and discharged home via private auto. ? ?[name] S Verle Brillhart, RN   ?

## 2022-02-21 NOTE — Progress Notes (Signed)
Pt to d/c to home today. Contacted nephrologist to make him aware of d/c and that pt will not receive next treatment until Friday. Nephrologist agreeable to d/c today with pt's next treatment being Friday as out-pt. Contacted [place] and spoke to [place]. Clinic aware pt for d/c today and will start on Friday. Message left for pt's husband to make him aware that pt will need to arrive at 11:15 on Friday for first appt. Contacted renal NP to request that orders be sent to clinic.  ? ?Melven Sartorius ?Renal Navigator ?[phone] ?

## 2022-02-21 NOTE — Progress Notes (Signed)
Occupational Therapy Treatment ?Patient Details ?Name: Nancy [name] ?MRN: [id number] ?DOB: 1961/01/21 ?Today's Date: 02/21/2022 ? ? ?History of present illness Nancy [name] is a 61 y.o. female presented to ED with a complaint of bilateral leg swelling.  Per patient, she has been taking her diuretics as prescribed and she has been making urine as well however she has noticed gradually worsening bilateral lower extremity swelling for past couple of weeks.  She has also noticed to have some exertional shortness of breath but no shortness of breath at rest. New to HD; with medical history significant of CKD stage V, hypertension, type 2 diabetes mellitus, CAD, chronic anemia, depression and many other comorbidities ?  ?OT comments ? Pt progressing towards acute OT goals. Planned to walk around bed to access recliner but pt with lightheadedness and n/v sitting EOB. Min A to step-pivot to sit up in recliner. D/c recommendation remains appropriate.   ? ?Recommendations for follow up therapy are one component of a multi-disciplinary discharge planning process, led by the attending physician.  Recommendations may be updated based on patient status, additional functional criteria and insurance authorization. ?   ?Follow Up Recommendations ? Home health OT  ?  ?Assistance Recommended at Discharge Frequent or constant Supervision/Assistance  ?Patient can return home with the following ? A little help with walking and/or transfers;A little help with bathing/dressing/bathroom;Assistance with cooking/housework;Assist for transportation;Direct supervision/assist for medications management ?  ?Equipment Recommendations ? None recommended by OT  ?  ?Recommendations for Other Services   ? ?  ?Precautions / Restrictions Precautions ?Precautions: Fall ?Precaution Comments: New RUE first stage HD access done; OK to use lightly, no heavy lifting, no constrictive clothing; Fall risk present, but on the low  side ?Restrictions ?Weight Bearing Restrictions: No  ? ? ?  ? ?Mobility Bed Mobility ?Overal bed mobility: Needs Assistance ?Bed Mobility: Supine to Sit ?  ?  ?Supine to sit: Min assist ?  ?  ?General bed mobility comments: Typically sleeps in recliner, so bed mobility here is not pivotal to dc home ?  ? ?Transfers ?Overall transfer level: Needs assistance ?Equipment used: Straight cane, Rolling walker (2 wheels) ?Transfers: Sit to/from Stand, Bed to chair/wheelchair/BSC ?Sit to Stand: Min assist ?  ?  ?Step pivot transfers: Min assist ?  ?  ?General transfer comment: cues for hand placement with RUE precautions and rw. cued to use L hand to push up. EOB>recliner ?  ?  ?Balance Overall balance assessment: Needs assistance ?Sitting-balance support: No upper extremity supported ?Sitting balance-Leahy Scale: Fair ?  ?  ?Standing balance support: Single extremity supported, Bilateral upper extremity supported ?Standing balance-Leahy Scale: Poor ?  ?  ?  ?  ?  ?  ?  ?  ?  ?  ?  ?  ?   ? ?ADL either performed or assessed with clinical judgement  ? ?ADL Overall ADL's : Needs assistance/impaired ?  ?  ?  ?  ?  ?  ?  ?  ?  ?  ?  ?  ?Toilet Transfer: Minimal assistance;Stand-pivot;BSC/3in1;Rolling walker (2 wheels) ?Toilet Transfer Details (indicate cue type and reason): simulated with SPT to recliner. assist to steady. cues for technique ?  ?  ?  ?  ?  ?General ADL Comments: increased time to complete tasks. ?  ? ?Extremity/Trunk Assessment Upper Extremity Assessment ?Upper Extremity Assessment: Generalized weakness;RUE deficits/detail ?RUE Deficits / Details: New RUE first stage HD access done; OK to use lightly, no heavy lifting, no constrictive  clothing. Wearing sling ?  ?Lower Extremity Assessment ?Lower Extremity Assessment: Defer to PT evaluation ?  ?  ?  ? ?Vision   ?  ?  ?Perception   ?  ?Praxis   ?  ? ?Cognition Arousal/Alertness: Awake/alert ?Behavior During Therapy: Flat affect ?Overall Cognitive Status: No  family/caregiver present to determine baseline cognitive functioning ?  ?  ?  ?  ?  ?  ?  ?  ?  ?  ?  ?  ?  ?  ?  ?  ?General Comments: delayed responses, decreased initiation. STM deficits. baseline? ?  ?  ?   ?Exercises   ? ?  ?Shoulder Instructions   ? ? ?  ?General Comments    ? ? ?Pertinent Vitals/ Pain       Pain Assessment ?Pain Assessment: Faces ?Faces Pain Scale: Hurts a little bit ?Pain Location: R UE ?Pain Descriptors / Indicators: Guarding ?Pain Intervention(s): Monitored during session ? ?Home Living   ?  ?  ?  ?  ?  ?  ?  ?  ?  ?  ?  ?  ?  ?  ?  ?  ?  ?  ? ?  ?Prior Functioning/Environment    ?  ?  ?  ?   ? ?Frequency ? Min 2X/week  ? ? ? ? ?  ?Progress Toward Goals ? ?OT Goals(current goals can now be found in the care plan section) ? Progress towards OT goals: Progressing toward goals ? ?Acute Rehab OT Goals ?Patient Stated Goal: Go home, I don't want to go to Rehab ?OT Goal Formulation: With patient ?Time For Goal Achievement: 03/06/22 ?Potential to Achieve Goals: Good ?ADL Goals ?Pt Will Perform Grooming: Independently;with set-up;sitting ?Pt Will Perform Lower Body Bathing: with adaptive equipment;sitting/lateral leans;sit to/from stand;with caregiver independent in assisting;with modified independence;with supervision ?Pt Will Perform Lower Body Dressing: with modified independence;sitting/lateral leans;sit to/from stand ?Pt Will Transfer to Toilet: with modified independence;bedside commode ?Pt Will Perform Toileting - Clothing Manipulation and hygiene: with modified independence;sitting/lateral leans;sit to/from stand  ?Plan Discharge plan remains appropriate   ? ?Co-evaluation ? ? ?   ?  ?  ?  ?  ? ?  ?AM-PAC OT "6 Clicks" Daily Activity     ?Outcome Measure ? ? Help from another person eating meals?: None ?Help from another person taking care of personal grooming?: A Little ?Help from another person toileting, which includes using toliet, bedpan, or urinal?: A Little ?Help from another person  bathing (including washing, rinsing, drying)?: A Lot ?Help from another person to put on and taking off regular upper body clothing?: A Little ?Help from another person to put on and taking off regular lower body clothing?: A Lot ?6 Click Score: 17 ? ?  ?End of Session Equipment Utilized During Treatment: Other (comment);Rolling walker (2 wheels) (cane) ? ?OT Visit Diagnosis: Unsteadiness on feet (R26.81);Muscle weakness (generalized) (M62.81) ?  ?Activity Tolerance Other (comment) (+ lightheaded and n/v upon sitting EOB) ?  ?Patient Left in chair;with call bell/phone within reach;with chair alarm set ?  ?Nurse Communication   ?  ? ?   ? ?Time: 7035-0093 ?OT Time Calculation (min): 29 min ? ?Charges: OT General Charges ?$OT Visit: 1 Visit ?OT Treatments ?$Self Care/Home Management : 23-37 mins ? ?Tyrone Schimke, OT ?Acute Rehabilitation Services ?Office: [phone] ? ? ?Tyrone Schimke H ?02/21/2022, 11:24 AM ?

## 2022-02-21 NOTE — Progress Notes (Signed)
Paramedicine Encounter ? ? ? Patient ID: Nancy [name], female    DOB: 08/08/1961, 61 y.o.   MRN: [id number] ? ?Arrived for follow up for patient who was just discharged from hospital today. Carlene appeared to be alert sitting in her recliner reporting she was feeling okay but nauseous. She states she vomited once on the way home.  ? ?Zula and I and her husband had discussed skilled rehab option earlier today and I had this discussion again with them in person both stating they felt like she would be better taken care of in a rehab facility short term as she is very weak and unable to ambulate well on her own for long distances (further than 3 feet) without assistance. She is living in an extended stay hotel with her husband who works full time 2nd shift Sunday through Thursday. She eats fast food and prepackaged meals and drinks soda daily. She is getting her medications delivered by [facility] however I come out weekly to fill her pill boxes as she does not know what she is taking, when and how much. Her husband administers her insulin daily. She has a dexcom which I switched out today.  ? ? ?I contacted LCSW Tammy Sours with HF clinic and made her aware of the patients want to go to a rehab facility and her and Opal Sidles B. From [facility] are working on this. I plan to continue to follow up.  ? ? ?I reviewed discharge summary and filled 2 pill boxes for 2 weeks as I will be absent next week. Keniya and her husband were made aware and know to call clinic staff for needs and they can notify Joellen Jersey the other community paramedic.  ? ?[name] will be receiving dialysis and [facility] MWF with an 12:00 chair time. 11:45 arrival. Her husband plans to take her and pick her up at this time. She does not have medical transportation set up.  ? ?I reviewed all appointments with her and her husband and wrote them down for her.  ? ?Next [facility] appointment is April 12.  ? ?I stressed the  importance of being present at every dialysis appointment and the dangers of skipping treatments, she and her husband agreed.  ? ?Refills: ?Carvedilol ?Ferrous Sulfate ?Risperidone ? ? ?I plan to see her in two weeks. Home visit complete.  ? ? ?ACTION: ?Home visit completed ? ? ? ? ? ? ?

## 2022-02-21 NOTE — Discharge Summary (Signed)
PatientPhysician Discharge Summary  Nancy [name] QPR:[id number] DOB: September 22, 1961 DOA: 02/14/2022  PCP: [name], MD  Admit date: 02/14/2022 Discharge date: 02/21/2022 30 Day Unplanned Readmission Risk Score    Flowsheet Row ED to Hosp-Admission (Current) from 02/14/2022 in [facility]  30 Day Unplanned Readmission Risk Score (%) 57.91 Filed at 02/21/2022 0801       This score is the patient's risk of an unplanned readmission within 30 days of being discharged (0 -100%). The score is based on dignosis, age, lab data, medications, orders, and past utilization.   Low:  0-14.9   Medium: 15-21.9   High: 22-29.9   Extreme: 30 and above          Admitted From: Home Disposition: Home  Recommendations for Outpatient Follow-up:  Follow up with PCP in 1-2 weeks Please obtain BMP/CBC in one week Follow-up with outpatient dialysis. Please follow up with your PCP on the following pending results: Unresulted Labs (From admission, onward)     Start     Ordered   Signed and Held  Renal function panel  Once,   R        Signed and Held   Signed and Held  Renal function panel  Once,   R       Question:  Specimen collection method  Answer:  Lab=Lab collect   Signed and Held   Signed and Held  CBC  Once,   R       Question:  Specimen collection method  Answer:  Lab=Lab collect   Signed and Held              Home Health: Yes Equipment/Devices: None  Discharge Condition: Stable CODE STATUS: Full code Diet recommendation: Renal and cardiac  Subjective: Seen and examined.  She has no complaints.  She is ready to go home  Brief/Interim Summary: Nancy [name] is a 61 y.o. female with medical history significant of CKD stage V, hypertension, type 2 diabetes mellitus, CAD, chronic anemia, depression and many other comorbidities  presented to ED with a complaint of bilateral leg swelling despite of being able to make urine associated with some exertional  shortness of breath. patient had graft placement in right arm on 12/22/2021 however that failed to mature.  She was admitted with acute on chronic diastolic congestive heart failure/volume overload and the need for new hemodialysis.  Nephrology consulted. TDC was placed by IR on 01/18/2022 and she underwent her first dialysis as well.  She was seen by vascular surgery and underwent right basilic vein transposition on 02/19/2022.  Seen by vascular surgery, doing well from their perspective and she is cleared for discharge. patient has been clipped to [facility] to initiate dialysis tomorrow with a TTS schedule.  PT OT recommends home health.  She is cleared for nephrology.  She is doing well.  She will be discharged today in stable condition.  Type 2 diabetes mellitus: She appears to be taking Lantus 30 units.  However she is on 15 units here and her blood sugar is controlled.  Based on that, I am discharging her on reduced dose of Lantus of 15 units.   Essential hypertension: Controlled.  Continue home medications.   Morbid obesity: Weight loss counseling provided.   HLD: Continue atorvastatin.   History of CAD: Asymptomatic.  Continue aspirin and Coreg.   Unstageable bilateral heel pressure wounds: Wound care was on board.  Discharge plan was discussed with patient and/or family  member and they verbalized understanding and agreed with it.  Discharge Diagnoses:  Principal Problem:   Renal anasarca Active Problems:   Acute on chronic diastolic CHF (congestive heart failure) (HCC)   ESRD (end stage renal disease) (HCC)   Hyperkalemia   Pressure injury of skin    Discharge Instructions   Allergies as of 02/21/2022       Reactions   Hydralazine Hcl Other (See Comments)   Hair loss   Hydrocodone Itching, Other (See Comments)   Upset stomach   Metformin And Related Nausea And Vomiting, Other (See Comments)   Stomach pains, also   Other Nausea Only, Other (See Comments)    Lettuce- Does not digest this!!   Plaquenil [hydroxychloroquine Sulfate] Hives   Shellfish-derived Products Nausea Only, Other (See Comments)   Caused an upset stomach   Shrimp (diagnostic) Nausea Only, Other (See Comments)   Upset stomach    Sulfa Antibiotics Hives        Medication List     STOP taking these medications    cephALEXin 500 MG capsule Commonly known as: KEFLEX   metolazone 2.5 MG tablet Commonly known as: ZAROXOLYN   potassium chloride SA 20 MEQ tablet Commonly known as: KLOR-CON M   torsemide 20 MG tablet Commonly known as: DEMADEX       TAKE these medications    acetaminophen 500 MG tablet Commonly known as: TYLENOL Take 500 mg by mouth every 6 (six) hours as needed for moderate pain or headache.   amLODipine 10 MG tablet Commonly known as: NORVASC Take 1 tablet (10 mg total) by mouth daily.   Aspirin Low Dose 81 MG EC tablet Generic drug: aspirin Take 1 tablet (81 mg total) by mouth daily.   atorvastatin 40 MG tablet Commonly known as: LIPITOR Take 1 tablet (40 mg total) by mouth daily.   calcium acetate 667 MG capsule Commonly known as: PHOSLO TAKE 1 CAPSULE (667 MG TOTAL) BY MOUTH 3 (THREE) TIMES DAILY WITH MEALS.   carvedilol 12.5 MG tablet Commonly known as: COREG Take 1.5 tablets (18.75 mg total) by mouth 2 (two) times daily with a meal.   Dexcom G6 Receiver Devi 1 Device by Does not apply route daily.   Dexcom G6 Sensor Misc 1 packet by Does not apply route daily.   Dexcom G6 Transmitter Misc 1 packet by Does not apply route daily.   ferrous sulfate 325 (65 FE) MG tablet Take 1 tablet (325 mg total) by mouth daily with breakfast.   glucose blood test strip Use as instructedCheck blood sugar three times daily. E11.40   insulin glargine 100 UNIT/ML Solostar Pen Commonly known as: LANTUS Inject 15 Units into the skin at bedtime. What changed: how much to take   insulin lispro 100 UNIT/ML KwikPen Commonly known as:  HumaLOG KwikPen Inject 4 units before breakfast and dinner. What changed:  how much to take how to take this when to take this additional instructions   Insulin Pen Needle 32G X 4 MM Misc Use to inject insulin as directed.   OneTouch Verio w/Device Kit Check blood sugar three times daily. E11.40   risperiDONE 3 MG tablet Commonly known as: RISPERDAL TAKE 1 TABLET (3 MG TOTAL) BY MOUTH AT BEDTIME. What changed:  how much to take how to take this when to take this        [facility] Follow up.   Why: Schedule is Monday/Wednesday/Friday with 12:20 chair time. For  first appointment, patient needs to arrive at 11:15 to complete paperwork. Contact information: [address] [place] [address] [phone]         [facility] Follow up in 5 week(s).   Specialty: Vascular Surgery Contact information: [address] [place] [phone]        [name], MD Follow up in 1 week(s).   Specialty: Internal Medicine Contact information: [place] [address] [phone]         [name], MD .   Specialties: Cardiology, Radiology Contact information: [address] [place] [address] [phone]         Larey Dresser, MD .   Specialty: Cardiology Contact information: [place] [address] [phone]                Allergies  Allergen Reactions   Hydralazine Hcl Other (See Comments)    Hair loss   Hydrocodone Itching and Other (See Comments)    Upset stomach   Metformin And Related Nausea And Vomiting and Other (See Comments)    Stomach pains, also   Other Nausea Only and Other (See Comments)    Lettuce- Does not digest this!!   Plaquenil [Hydroxychloroquine Sulfate] Hives   Shellfish-Derived Products Nausea Only and Other (See Comments)    Caused an upset stomach    Shrimp (Diagnostic) Nausea Only and Other (See Comments)    Upset stomach    Sulfa Antibiotics Hives    Consultations: Nephrology and vascular surgery and IR   Procedures/Studies: DG Chest 2 View  Result Date: 02/14/2022 CLINICAL DATA:  Shortness of breath.  Leg swelling. EXAM: CHEST - 2 VIEW COMPARISON:  AP chest 12/22/2021 FINDINGS: Interval extubation. Cardiac silhouette and mediastinal contours are within normal limits. The lungs are clear with resolution of the prior bilateral heterogeneous opacities. No pleural effusion or pneumothorax. No acute skeletal abnormality. Likely cholecystectomy clips on lateral view. IMPRESSION: No active cardiopulmonary disease. Electronically Signed   By: Yvonne Kendall M.D.   On: 02/14/2022 15:31   DG Os Calcis Left  Result Date: 02/02/2022 CLINICAL DATA:  Diabetic foot ulcer with osteomyelitis. EXAM: LEFT OS CALCIS - 2+ VIEW COMPARISON:  10/27/2021. FINDINGS: A large soft tissue defect is present along the inferior and posterior aspect of the calcaneus to the level of the bone. Periosteal elevations and bony erosion is noted along the plantar aspect of the calcaneus in this region, compatible with osteomyelitis. No acute fracture or dislocation. IMPRESSION: Soft tissue defect along the inferior posterior aspect of the calcaneus. There is bony erosion and periosteal elevation involving the inferior aspect of the calcaneus, compatible with osteomyelitis. Electronically Signed   By: Brett Fairy M.D.   On: 02/02/2022 01:39   IR Fluoro Guide CV Line Right  Result Date: 02/15/2022 INDICATION: Renal failure EXAM: TUNNELED CENTRAL VENOUS HEMODIALYSIS CATHETER PLACEMENT WITH ULTRASOUND AND FLUOROSCOPIC GUIDANCE MEDICATIONS: Ancef 2 g IV. The antibiotic was given in an appropriate time interval prior to skin puncture. ANESTHESIA/SEDATION: Moderate (conscious) sedation was employed during this procedure. A total of Versed 1 mg and Fentanyl 50 mcg was administered  intravenously. Moderate Sedation Time: 18 minutes. The patient's level of consciousness and vital signs were monitored continuously by radiology nursing throughout the procedure under my direct supervision. FLUOROSCOPY TIME:  Fluoroscopy Time: 0 minutes 12 seconds (1 mGy). COMPLICATIONS: None immediate. PROCEDURE: Informed written consent was obtained from the patient after a  discussion of the risks, benefits, and alternatives to treatment. Questions regarding the procedure were encouraged and answered. The right neck and chest were prepped with chlorhexidine in a sterile fashion, and a sterile drape was applied covering the operative field. Maximum barrier sterile technique with sterile gowns and gloves were used for the procedure. A timeout was performed prior to the initiation of the procedure. The right internal jugular vein was evaluated with ultrasound and shown to be patent. A permanent ultrasound image was obtained and placed in the patient's medical record. Using sterile gel and a sterile probe cover, the right internal jugular vein was entered with a 21 ga needle during real time ultrasound guidance. 0.018 inch guidewire placed and 21 ga needle exchanged for transitional dilator set. Utilizing fluoroscopy, 0.035 inch guidewire advanced through the needle without difficulty. Seriel dilation was performed and peel-away sheath was placed. Attention then turned to the right anterior upper chest. Following local lidocaine administration, the hemodialysis catheter was tunneled from the chest wall to the venotomy site. The catheter was inserted through the peel-away sheath. The tip of the catheter was positioned within the right atrium using fluoroscopic guidance. All lumens of the catheter aspirated and flushed well. The dialysis lumens were locked with Heparin. The catheter was secured to the skin with suture. The insertion site was covered with sterile dressing. IMPRESSION: Successful placement of 19 cm tip to  cuff tunneled hemodialysis catheter via the right internal jugular vein with tips terminating within the superior aspect of the right atrium. The catheter is ready for immediate use. Electronically Signed   By: Miachel Roux M.D.   On: 02/15/2022 15:59   IR US Guide Vasc Access Right  Result Date: 02/15/2022 INDICATION: Renal failure EXAM: TUNNELED CENTRAL VENOUS HEMODIALYSIS CATHETER PLACEMENT WITH ULTRASOUND AND FLUOROSCOPIC GUIDANCE MEDICATIONS: Ancef 2 g IV. The antibiotic was given in an appropriate time interval prior to skin puncture. ANESTHESIA/SEDATION: Moderate (conscious) sedation was employed during this procedure. A total of Versed 1 mg and Fentanyl 50 mcg was administered intravenously. Moderate Sedation Time: 18 minutes. The patient's level of consciousness and vital signs were monitored continuously by radiology nursing throughout the procedure under my direct supervision. FLUOROSCOPY TIME:  Fluoroscopy Time: 0 minutes 12 seconds (1 mGy). COMPLICATIONS: None immediate. PROCEDURE: Informed written consent was obtained from the patient after a discussion of the risks, benefits, and alternatives to treatment. Questions regarding the procedure were encouraged and answered. The right neck and chest were prepped with chlorhexidine in a sterile fashion, and a sterile drape was applied covering the operative field. Maximum barrier sterile technique with sterile gowns and gloves were used for the procedure. A timeout was performed prior to the initiation of the procedure. The right internal jugular vein was evaluated with ultrasound and shown to be patent. A permanent ultrasound image was obtained and placed in the patient's medical record. Using sterile gel and a sterile probe cover, the right internal jugular vein was entered with a 21 ga needle during real time ultrasound guidance. 0.018 inch guidewire placed and 21 ga needle exchanged for transitional dilator set. Utilizing fluoroscopy, 0.035 inch  guidewire advanced through the needle without difficulty. Seriel dilation was performed and peel-away sheath was placed. Attention then turned to the right anterior upper chest. Following local lidocaine administration, the hemodialysis catheter was tunneled from the chest wall to the venotomy site. The catheter was inserted through the peel-away sheath. The tip of the catheter was positioned within the right atrium using fluoroscopic guidance. All  lumens of the catheter aspirated and flushed well. The dialysis lumens were locked with Heparin. The catheter was secured to the skin with suture. The insertion site was covered with sterile dressing. IMPRESSION: Successful placement of 19 cm tip to cuff tunneled hemodialysis catheter via the right internal jugular vein with tips terminating within the superior aspect of the right atrium. The catheter is ready for immediate use. Electronically Signed   By: Miachel Roux M.D.   On: 02/15/2022 15:59     Discharge Exam: Vitals:   02/21/22 0420 02/21/22 0812  BP: (!) 133/59 133/73  Pulse: 66 65  Resp: 18 16  Temp: 98.4 F (36.9 C) 98.4 F (36.9 C)  SpO2: 100% 90%   Vitals:   02/20/22 1508 02/20/22 2030 02/21/22 0420 02/21/22 0812  BP: 128/72 (!) 135/55 (!) 133/59 133/73  Pulse: 65 68 66 65  Resp: _0 Temp: (!) 96.9 F (36.1 C) 98.1 F (36.7 C) 98.4 F (36.9 C) 98.4 F (36.9 C)  TempSrc: Temporal   Oral  SpO2: 95% 98% 100% 90%  Weight: 95.8 kg     Height:        General: Pt is alert, awake, not in acute distress Cardiovascular: RRR, S1/S2 +, no rubs, no gallops Respiratory: CTA bilaterally, no wheezing, no rhonchi Abdominal: Soft, NT, ND, bowel sounds + Extremities: no edema, no cyanosis    The results of significant diagnostics from this hospitalization (including imaging, microbiology, ancillary and laboratory) are listed below for reference.     Microbiology: Recent Results (from the past 240 hour(s))  Resp Panel by RT-PCR  (Flu A&B, Covid) Nasopharyngeal Swab     Status: None   Collection Time: 02/14/22  4:40 PM   Specimen: Nasopharyngeal Swab; Nasopharyngeal(NP) swabs in vial transport medium  Result Value Ref Range Status   SARS Coronavirus 2 by RT PCR NEGATIVE NEGATIVE Final    Comment: (NOTE) SARS-CoV-2 target nucleic acids are NOT DETECTED.  The SARS-CoV-2 RNA is generally detectable in upper respiratory specimens during the acute phase of infection. The lowest concentration of SARS-CoV-2 viral copies this assay can detect is 138 copies/mL. A negative result does not preclude SARS-Cov-2 infection and should not be used as the sole basis for treatment or other patient management decisions. A negative result may occur with  improper specimen collection/handling, submission of specimen other than nasopharyngeal swab, presence of viral mutation(s) within the areas targeted by this assay, and inadequate number of viral copies(<138 copies/mL). A negative result must be combined with clinical observations, patient history, and epidemiological information. The expected result is Negative.  Fact Sheet for Patients:  EntrepreneurPulse.com.au  Fact Sheet for Healthcare Providers:  IncredibleEmployment.be  This test is no t yet approved or cleared by the [place] FDA and  has been authorized for detection and/or diagnosis of SARS-CoV-2 by FDA under an Emergency Use Authorization (EUA). This EUA will remain  in effect (meaning this test can be used) for the duration of the COVID-19 declaration under Section 564(b)(1) of the Act, 21 U.S.C.section 360bbb-3(b)(1), unless the authorization is terminated  or revoked sooner.       Influenza A by PCR NEGATIVE NEGATIVE Final   Influenza B by PCR NEGATIVE NEGATIVE Final    Comment: (NOTE) The Xpert Xpress SARS-CoV-2/FLU/RSV plus assay is intended as an aid in the diagnosis of influenza from Nasopharyngeal swab specimens  and should not be used as a sole basis for treatment. Nasal washings and aspirates are unacceptable for Xpert  Xpress SARS-CoV-2/FLU/RSV testing.  Fact Sheet for Patients: EntrepreneurPulse.com.au  Fact Sheet for Healthcare Providers: IncredibleEmployment.be  This test is not yet approved or cleared by the [place] FDA and has been authorized for detection and/or diagnosis of SARS-CoV-2 by FDA under an Emergency Use Authorization (EUA). This EUA will remain in effect (meaning this test can be used) for the duration of the COVID-19 declaration under Section 564(b)(1) of the Act, 21 U.S.C. section 360bbb-3(b)(1), unless the authorization is terminated or revoked.  Performed at [facility], [place] [address], [place], [place] [address]      Labs: BNP (last 3 results) Recent Labs    01/05/22 1246 01/26/22 1255 02/14/22 1500  BNP 500.5* 303.5* 944.9*   Basic Metabolic Panel: Recent Labs  Lab 02/14/22 1500 02/14/22 1639 02/15/22 0838 02/15/22 1944 02/17/22 0749 02/19/22 0949 02/20/22 1216  NA 131*  --  134* 134* 135 135 134*  K 6.0*  --  4.8 4.5 4.2 4.2 4.6  CL 95*  --  102 98 96* 98 97*  CO2 26  --   --  21* 29  --  26  GLUCOSE 97  --  53* 127* 117* 94 117*  BUN 72*  --  67* 72* 49* 46* 57*  CREATININE 6.03* 6.20* 7.30* 6.17* 5.03* 5.20* 5.54*  CALCIUM 8.7*  --   --  9.0 8.4*  --  8.7*  MG  --  2.7*  --   --   --   --   --   PHOS  --   --   --  6.5* 5.6*  --  6.4*   Liver Function Tests: Recent Labs  Lab 02/14/22 1639 02/15/22 1944 02/17/22 0749 02/20/22 1216  AST 13* 20  --   --   ALT 9 15  --   --   ALKPHOS 46 61  --   --   BILITOT 0.6 0.3  --   --   PROT 7.7 7.8  --   --   ALBUMIN 3.2* 3.2* 2.7* 2.9*   No results for input(s): LIPASE, AMYLASE in the last 168 hours. No results for input(s): AMMONIA in the last 168 hours. CBC: Recent Labs  Lab 02/14/22 1500 02/15/22 0838 02/15/22 0846 02/15/22 1944  02/16/22 0122 02/17/22 0749 02/19/22 0949 02/20/22 1216  WBC 9.6  --  QUESTIONABLE RESULTS, RECOMMEND RECOLLECT TO VERIFY 9.2 9.4 11.0*  --  13.8*  NEUTROABS 6.6  --   --   --   --   --   --   --   HGB 8.8*   < > QUESTIONABLE RESULTS, RECOMMEND RECOLLECT TO VERIFY 10.6* 8.6* 8.0* 9.5* 8.0*  HCT 27.0*   < > QUESTIONABLE RESULTS, RECOMMEND RECOLLECT TO VERIFY 32.0* 26.1* 24.8* 28.0* 25.0*  MCV 88.5  --  QUESTIONABLE RESULTS, RECOMMEND RECOLLECT TO VERIFY 87.4 88.5 88.9  --  89.9  PLT 320  --  QUESTIONABLE RESULTS, RECOMMEND RECOLLECT TO VERIFY 314 297 284  --  263   < > = values in this interval not displayed.   Cardiac Enzymes: No results for input(s): CKTOTAL, CKMB, CKMBINDEX, TROPONINI in the last 168 hours. BNP: Invalid input(s): POCBNP CBG: Recent Labs  Lab 02/20/22 1130 02/20/22 1621 02/20/22 2030 02/21/22 0423 02/21/22 0735  GLUCAP 110* 130* 252* 117* 187*   D-Dimer No results for input(s): DDIMER in the last 72 hours. Hgb A1c No results for input(s): HGBA1C in the last 72 hours. Lipid Profile No results for input(s): CHOL,  HDL, LDLCALC, TRIG, CHOLHDL, LDLDIRECT in the last 72 hours. Thyroid function studies No results for input(s): TSH, T4TOTAL, T3FREE, THYROIDAB in the last 72 hours.  Invalid input(s): FREET3 Anemia work up No results for input(s): VITAMINB12, FOLATE, FERRITIN, TIBC, IRON, RETICCTPCT in the last 72 hours. Urinalysis    Component Value Date/Time   COLORURINE YELLOW 10/06/2021 0138   APPEARANCEUR CLEAR 10/06/2021 0138   LABSPEC 1.009 10/06/2021 0138   PHURINE 5.0 10/06/2021 0138   GLUCOSEU 50 (A) 10/06/2021 0138   HGBUR MODERATE (A) 10/06/2021 0138   BILIRUBINUR NEGATIVE 10/06/2021 0138   BILIRUBINUR negative 12/26/2017 1458   KETONESUR NEGATIVE 10/06/2021 0138   PROTEINUR >=300 (A) 10/06/2021 0138   UROBILINOGEN 0.2 12/26/2017 1458   UROBILINOGEN 0.2 09/07/2014 0857   NITRITE NEGATIVE 10/06/2021 0138   LEUKOCYTESUR NEGATIVE 10/06/2021 0138    Sepsis Labs Invalid input(s): PROCALCITONIN,  WBC,  LACTICIDVEN Microbiology Recent Results (from the past 240 hour(s))  Resp Panel by RT-PCR (Flu A&B, Covid) Nasopharyngeal Swab     Status: None   Collection Time: 02/14/22  4:40 PM   Specimen: Nasopharyngeal Swab; Nasopharyngeal(NP) swabs in vial transport medium  Result Value Ref Range Status   SARS Coronavirus 2 by RT PCR NEGATIVE NEGATIVE Final    Comment: (NOTE) SARS-CoV-2 target nucleic acids are NOT DETECTED.  The SARS-CoV-2 RNA is generally detectable in upper respiratory specimens during the acute phase of infection. The lowest concentration of SARS-CoV-2 viral copies this assay can detect is 138 copies/mL. A negative result does not preclude SARS-Cov-2 infection and should not be used as the sole basis for treatment or other patient management decisions. A negative result may occur with  improper specimen collection/handling, submission of specimen other than nasopharyngeal swab, presence of viral mutation(s) within the areas targeted by this assay, and inadequate number of viral copies(<138 copies/mL). A negative result must be combined with clinical observations, patient history, and epidemiological information. The expected result is Negative.  Fact Sheet for Patients:  EntrepreneurPulse.com.au  Fact Sheet for Healthcare Providers:  IncredibleEmployment.be  This test is no t yet approved or cleared by the [place] FDA and  has been authorized for detection and/or diagnosis of SARS-CoV-2 by FDA under an Emergency Use Authorization (EUA). This EUA will remain  in effect (meaning this test can be used) for the duration of the COVID-19 declaration under Section 564(b)(1) of the Act, 21 U.S.C.section 360bbb-3(b)(1), unless the authorization is terminated  or revoked sooner.       Influenza A by PCR NEGATIVE NEGATIVE Final   Influenza B by PCR NEGATIVE NEGATIVE Final     Comment: (NOTE) The Xpert Xpress SARS-CoV-2/FLU/RSV plus assay is intended as an aid in the diagnosis of influenza from Nasopharyngeal swab specimens and should not be used as a sole basis for treatment. Nasal washings and aspirates are unacceptable for Xpert Xpress SARS-CoV-2/FLU/RSV testing.  Fact Sheet for Patients: EntrepreneurPulse.com.au  Fact Sheet for Healthcare Providers: IncredibleEmployment.be  This test is not yet approved or cleared by the [place] FDA and has been authorized for detection and/or diagnosis of SARS-CoV-2 by FDA under an Emergency Use Authorization (EUA). This EUA will remain in effect (meaning this test can be used) for the duration of the COVID-19 declaration under Section 564(b)(1) of the Act, 21 U.S.C. section 360bbb-3(b)(1), unless the authorization is terminated or revoked.  Performed at [facility], [place] [address], [place], [place] [address]      Time coordinating discharge: Over 30 minutes  SIGNED:  Darliss Cheney, MD  [facility] 02/21/2022, 9:12 AM *Please note that this is a verbal dictation therefore any spelling or grammatical errors are due to the "[employer] One" system interpretation. If 7PM-7AM, please contact night-coverage [email or web address]

## 2022-02-21 NOTE — Progress Notes (Signed)
[facility] ?ROUNDING NOTE  ? ?Subjective:  ? ?Interval History: 61 year old lady with new start end-stage renal disease.  History of hypertension diabetes.  Status post dialysis 02/16/2022 and 02/17/2022.    She has an AV fistula to be placed Monday, 02/19/2022.  She is clipped to [facility].  Patient underwent dialysis 02/20/2022 with 2.6 L removed.  Next dialysis will be 02/22/2022 ? ?Blood pressure 133/59 pulse 79 temperature 98.4 ? ?Sodium 134 potassium 4.6 chloride 97 CO2 26 BUN 57 creatinine 5.5 glucose 117 calcium 8.7 phosphorus 6.4 albumin 2.9 hemoglobin 8.0 ? ? ?Objective:  ?Vital signs in last 24 hours:  ?Temp:  [96.9 ?F (36.1 ?C)-98.4 ?F (36.9 ?C)] 98.4 ?F (36.9 ?C) (03/08 0420) ?Pulse Rate:  [59-70] 66 (03/08 0420) ?Resp:  [6-18] 18 (03/08 0420) ?BP: (87-149)/(50-82) 133/59 (03/08 0420) ?SpO2:  [95 %-100 %] 100 % (03/08 0420) ?Weight:  [95.8 kg-98.4 kg] 95.8 kg (03/07 1508) ? ?Weight change: 2.5 kg ?Filed Weights  ? 02/20/22 0416 02/20/22 1200 02/20/22 1508  ?Weight: 98.4 kg 98.4 kg 95.8 kg  ? ? ?Intake/Output: ?I/O last 3 completed shifts: ?In: -  ?Out: 2974 [Urine:300; Other:2674] ?  ?Intake/Output this shift: ? No intake/output data recorded. ? ?CVS- RRR ?RS- CTA ?ABD- BS present soft non-distended ?EXT-1+ lower extremity edema. ? ? ?Basic Metabolic Panel: ?Recent Labs  ?Lab 02/14/22 ?1500 02/14/22 ?1639 02/15/22 ?1062 02/15/22 ?1944 02/17/22 ?0749 02/19/22 ?6948 02/20/22 ?1216  ?NA 131*  --  134* 134* 135 135 134*  ?K 6.0*  --  4.8 4.5 4.2 4.2 4.6  ?CL 95*  --  102 98 96* 98 97*  ?CO2 26  --   --  21* 29  --  26  ?GLUCOSE 97  --  53* 127* 117* 94 117*  ?BUN 72*  --  67* 72* 49* 46* 57*  ?CREATININE 6.03* 6.20* 7.30* 6.17* 5.03* 5.20* 5.54*  ?CALCIUM 8.7*  --   --  9.0 8.4*  --  8.7*  ?MG  --  2.7*  --   --   --   --   --   ?PHOS  --   --   --  6.5* 5.6*  --  6.4*  ? ? ? ?Liver Function Tests: ?Recent Labs  ?Lab 02/14/22 ?1639 02/15/22 ?1944 02/17/22 ?5462 02/20/22 ?1216  ?AST 13*  20  --   --   ?ALT 9 15  --   --   ?ALKPHOS 46 61  --   --   ?BILITOT 0.6 0.3  --   --   ?PROT 7.7 7.8  --   --   ?ALBUMIN 3.2* 3.2* 2.7* 2.9*  ? ? ?No results for input(s): LIPASE, AMYLASE in the last 168 hours. ?No results for input(s): AMMONIA in the last 168 hours. ? ?CBC: ?Recent Labs  ?Lab 02/14/22 ?1500 02/15/22 ?7035 02/15/22 ?0093 02/15/22 ?1944 02/16/22 ?0122 02/17/22 ?8182 02/19/22 ?9937 02/20/22 ?1216  ?WBC 9.6  --  QUESTIONABLE RESULTS, RECOMMEND RECOLLECT TO VERIFY 9.2 9.4 11.0*  --  13.8*  ?NEUTROABS 6.6  --   --   --   --   --   --   --   ?HGB 8.8*   < > QUESTIONABLE RESULTS, RECOMMEND RECOLLECT TO VERIFY 10.6* 8.6* 8.0* 9.5* 8.0*  ?HCT 27.0*   < > QUESTIONABLE RESULTS, RECOMMEND RECOLLECT TO VERIFY 32.0* 26.1* 24.8* 28.0* 25.0*  ?MCV 88.5  --  QUESTIONABLE RESULTS, RECOMMEND RECOLLECT TO VERIFY 87.4 88.5 88.9  --  89.9  ?  PLT 320  --  QUESTIONABLE RESULTS, RECOMMEND RECOLLECT TO VERIFY 314 297 284  --  263  ? < > = values in this interval not displayed.  ? ? ? ?Cardiac Enzymes: ?No results for input(s): CKTOTAL, CKMB, CKMBINDEX, TROPONINI in the last 168 hours. ? ?BNP: ?Invalid input(s): POCBNP ? ?CBG: ?Recent Labs  ?Lab 02/20/22 ?1130 02/20/22 ?1621 02/20/22 ?2030 02/21/22 ?0423 02/21/22 ?0735  ?GLUCAP 110* 130* 252* 117* 187*  ? ? ? ?Microbiology: ?Results for orders placed or performed during the hospital encounter of 02/14/22  ?Resp Panel by RT-PCR (Flu A&B, Covid) Nasopharyngeal Swab     Status: None  ? Collection Time: 02/14/22  4:40 PM  ? Specimen: Nasopharyngeal Swab; Nasopharyngeal(NP) swabs in vial transport medium  ?Result Value Ref Range Status  ? SARS Coronavirus 2 by RT PCR NEGATIVE NEGATIVE Final  ?  Comment: (NOTE) ?SARS-CoV-2 target nucleic acids are NOT DETECTED. ? ?The SARS-CoV-2 RNA is generally detectable in upper respiratory ?specimens during the acute phase of infection. The lowest ?concentration of SARS-CoV-2 viral copies this assay can detect is ?138 copies/mL. A negative  result does not preclude SARS-Cov-2 ?infection and should not be used as the sole basis for treatment or ?other patient management decisions. A negative result may occur with  ?improper specimen collection/handling, submission of specimen other ?than nasopharyngeal swab, presence of viral mutation(s) within the ?areas targeted by this assay, and inadequate number of viral ?copies(<138 copies/mL). A negative result must be combined with ?clinical observations, patient history, and epidemiological ?information. The expected result is Negative. ? ?Fact Sheet for Patients:  ?EntrepreneurPulse.com.au ? ?Fact Sheet for Healthcare Providers:  ?IncredibleEmployment.be ? ?This test is no t yet approved or cleared by the [place] FDA and  ?has been authorized for detection and/or diagnosis of SARS-CoV-2 by ?FDA under an Emergency Use Authorization (EUA). This EUA will remain  ?in effect (meaning this test can be used) for the duration of the ?COVID-19 declaration under Section 564(b)(1) of the Act, 21 ?U.S.C.section 360bbb-3(b)(1), unless the authorization is terminated  ?or revoked sooner.  ? ? ?  ? Influenza A by PCR NEGATIVE NEGATIVE Final  ? Influenza B by PCR NEGATIVE NEGATIVE Final  ?  Comment: (NOTE) ?The Xpert Xpress SARS-CoV-2/FLU/RSV plus assay is intended as an aid ?in the diagnosis of influenza from Nasopharyngeal swab specimens and ?should not be used as a sole basis for treatment. Nasal washings and ?aspirates are unacceptable for Xpert Xpress SARS-CoV-2/FLU/RSV ?testing. ? ?Fact Sheet for Patients: ?EntrepreneurPulse.com.au ? ?Fact Sheet for Healthcare Providers: ?IncredibleEmployment.be ? ?This test is not yet approved or cleared by the [place] FDA and ?has been authorized for detection and/or diagnosis of SARS-CoV-2 by ?FDA under an Emergency Use Authorization (EUA). This EUA will remain ?in effect (meaning this test can be used)  for the duration of the ?COVID-19 declaration under Section 564(b)(1) of the Act, 21 U.S.C. ?section 360bbb-3(b)(1), unless the authorization is terminated or ?revoked. ? ?Performed at [facility], [place] [address] [place], [place] ?[address] ?  ? ? ?Coagulation Studies: ?No results for input(s): LABPROT, INR in the last 72 hours. ? ?Urinalysis: ?No results for input(s): COLORURINE, LABSPEC, [place], GLUCOSEU, HGBUR, BILIRUBINUR, KETONESUR, PROTEINUR, UROBILINOGEN, NITRITE, LEUKOCYTESUR in the last 72 hours. ? ?Invalid input(s): APPERANCEUR  ? ? ?Imaging: ?No results found. ? ? ?Medications:  ? ? ferric gluconate (FERRLECIT) IVPB Stopped (02/20/22 1551)  ? ? amLODipine  10 mg Oral Daily  ? aspirin EC  81 mg Oral Daily  ?  atorvastatin  40 mg Oral Daily  ? calcium acetate  667 mg Oral TID WC  ? carvedilol  18.75 mg Oral BID WC  ? cephALEXin  500 mg Oral Q12H  ? Chlorhexidine Gluconate Cloth  6 each Topical Q0600  ? Chlorhexidine Gluconate Cloth  6 each Topical Q0600  ? collagenase   Topical BID  ? darbepoetin (ARANESP) injection - DIALYSIS  40 mcg Intravenous Q Sat-HD  ? furosemide  60 mg Intravenous Once  ? heparin  5,000 Units Subcutaneous Q8H  ? insulin aspart  0-15 Units Subcutaneous TID WC  ? insulin aspart  0-5 Units Subcutaneous QHS  ? insulin glargine-yfgn  15 Units Subcutaneous QHS  ? risperiDONE  3 mg Oral QHS  ? ?acetaminophen, cloNIDine, heparin sodium (porcine), mineral oil-hydrophilic petrolatum, ondansetron **OR** ondansetron (ZOFRAN) IV, oxyCODONE-acetaminophen ? ?Assessment/ Plan:  ?ESRD-new start secondary to diabetes.  Looks like patient has been clipped to [facility].  She is scheduled for AV fistula 02/19/2022.  Underwent successful dialysis 02/20/2022 ?SOB 02/22/2022 ?ANEMIA-iron and darbepoetin initiated. 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 ?HTN/VOL-improving with ultrafiltration ?ACCESS-AV fistula placed 02/19/2022 ? ? ? LOS: 7 ?Sherril Croon ?'@TODAY''@7'$ :39 AM ?  ?

## 2022-02-21 NOTE — Telephone Encounter (Signed)
Spoke to Mr. Bolduc who had given me the number for the case manager at [facility] to follow up with about discharge plans.  ? ? ?I contacted Daphne and she reports they had tried to suggest SNF for Mrs. Francis and she initially declined. She also informed me that Medicaid denied [facility] serviced and she would be contacting them.  ? ?I informed Darnelle Bos of Mrs. Moreheads living situation in a hotel, not sleeping in a bed but in a recliner with only access to fast food and using a bedside commode. Daphne made aware that I come out once a week and manage her medications through the heart failure clinic.  ? ? ?Mrs. Holtman and I spoke and she was educated about SNF and benefits rehab and she agreed to same however Darnelle Bos stated that patient was already discharged and this may not be an option now since patient initially refused. Daphne stated she would talk with providers and let me know.  ? ?At 12:00 Mr. Brakebill called me to notify me she would be getting discharged home. ?I plan to follow up to make medication changes today in pill box and will continue to follow up weekly.  ? ? ? ?

## 2022-02-22 ENCOUNTER — Telehealth (HOSPITAL_COMMUNITY): Payer: Self-pay | Admitting: Licensed Clinical Social Worker

## 2022-02-22 ENCOUNTER — Telehealth: Payer: Self-pay

## 2022-02-22 NOTE — Telephone Encounter (Signed)
CSW able to speak with admissions coordinators regarding SNF placement ? ?[facility] accept pt given payor source ? ?[facility]- awaiting discussion with supervisor if they are able to accept ? ?[facility]- left VM for admissions coordinator to discuss ? ?CSW discussed looking further outside of [facility] if no other [place] facilities can accept but pt not agreeable to this currently. ? ?PCS referral had been sent to [facility] by PCP office and home visit was completed a few weeks ago but pt had not heard back from [facility] regarding an aid coming out.  CSW called [employer] and spoke with a rep who states that referral still appears to be untouched with chosen home agency [facility])  she suggest that pt calls to get referral sent elsewhere since they have not responded.  Pt informed of this and will plan to call ASAP to have this changed and inquire about getting expedited services given recent hospital stay ? ?Will continue to follow and assist as needed ? ?Jorge Ny, LCSW ?Clinical Social Worker ?Advanced Heart Failure Clinic ?Desk#: [phone] ?Cell#: [phone] ? ?

## 2022-02-22 NOTE — Telephone Encounter (Signed)
Transition Care Management Unsuccessful Follow-up Telephone Call ? ?Date of discharge and from where:  02/21/2022, [facility]  ? ?Attempts:  1st Attempt ? ?Reason for unsuccessful TCM follow-up call:  Left voice message # [phone] ? ? ? ?

## 2022-02-23 ENCOUNTER — Telehealth: Payer: Self-pay

## 2022-02-23 ENCOUNTER — Other Ambulatory Visit: Payer: Self-pay

## 2022-02-23 DIAGNOSIS — D689 Coagulation defect, unspecified: Secondary | ICD-10-CM | POA: Insufficient documentation

## 2022-02-23 DIAGNOSIS — T782XXA Anaphylactic shock, unspecified, initial encounter: Secondary | ICD-10-CM | POA: Insufficient documentation

## 2022-02-23 DIAGNOSIS — T7849XA Other allergy, initial encounter: Secondary | ICD-10-CM | POA: Insufficient documentation

## 2022-02-23 NOTE — Progress Notes (Incomplete)
Advanced Heart Failure Clinic Note   PCP: [name], MD PCP-Cardiologist: Quay Burow, MD  HF Cardiologist: Dr. Aundra Dubin  HPI: Nancy [name] 61 y.o. AAF w/ chronic diastolic heart failure, Stage IV CKD (baseline SCr ~2-3), HTN, T2DM, HLD, CVA, PVD, carotid stenosis, and tobacco abuse.   Echo 11/2019 showed EF 55-60%, RV normal. No LVH. No significant valvular dysfunction   She was referred to Dr. Gwenlyn Found for [facility] by PCP in April 2022. Complained of progressive exertional dyspnea, LEE and orthopnea. Echo 4/22 with EF 60-65%, GIIDD, mild LVH, RV normal. Cardiolite showed findings suggestive of possible anterior ischemia vs breast attenuation. R/LHC on 04/27/21 showed mild nonobstructive CAD and RHC hemodynamics c/w pulmonary HTN. Diuretics were increased w/ plans to refer to the [facility]. Dry wt felt to be ~228 lb.   Admitted 5/24-5/27/22=> discharged home on Lasix 40 daily.   Admitted 6/25-6/30/22 =>SCr bump to 3.2 (2.9 at d/c). Discharged home on Torsemide 40 daily. Echo EF 55-60%, RV normal.   Admitted 07/07/21= > direct admit from Dr. Kennon Holter office w/ marked fluid overload, progressive wt gain, exertional dyspnea, LEE and orthopnea. Clinic BP elevated 172/85. SCr 2.6 on admit. Started on lasix gtt. AHF team consulted to assist with further management. She underwent RHC, suggested ongoing volume overload with elevated PCWP and RA.  Prominent v-waves in PCWP but think due to severe diastolic dysfunction as there was no significant MR on review of echo.  PYP study in 7/22 was not suggestive of TTR cardiac amyloidosis.  Suspect severe diastolic dysfunction due to long-standing and poorly controlled HTN. SCr and potassium continued to climb and nephrology consulted. Her diuretics were adjusted and she was able to be discharged on low dose carvedilol and torsemide 40 daily; weight 226 lbs.  Admitted 09/04/21 with a left heel ulcer secondary to diabetic neuropathy.  Work-up included right ABI  of 0.45 and left ABI of 0.5.  She was started on antibiotics, no osteomyelitis on MRI. Vascular consulted and did not pursue angiography as it could potentially push her into renal failure and require initiation of HD. Ortho recommended left transtibial amputation  but she declined. Palliative care met with her but she did not want to discuss her health issues. There was no indication for HD, per nephrology, but AV fistula was recommended. She was not interested in this. PT recommended SNF however patient declined. She was discharged with Home Health.   Admitted 10/22 w/ acute on chronic HFpEF exacerbation w/ acute pulmonary edema in setting of hypertensive urgency. Acute exacerbation felt triggered by poor med compliance and dietary indiscretion w/ sodium + high fluid intake. Echo in 10/22 showed EF 50-55%, mod LVH, G2DD. Course c/b worsening CKD. SCr bumped to 3.12 (prior baseline ~ 2.5). Nephrology was consulted but felt no emergent indication for HD. She responded well to IV Lasix, though nephrology anticipated that she may be nearing need for HD soon. Plan is to follow in the outpatient setting. Also noted to have gangrenous pressure ulcers of bilateral heels left and right dorsal feet. Now followed by wound care. Discharged home on torsemide 60 mg daily. D/c SCr 3.12. D/c wt 231 lb.   Admitted 11/22 with A/C CHF exacerbation and left foot osteomyelitis. She was diuresed with IV lasix and transitioned to po. GDMT limited by CKD IV. Ortho surgery again recommended amputation, she continued to decline. She was also noted to have wound on right metatarsal, XR showed no osteomyelitis. She received bedside debridement and wound care to left foot.  She was discharged home with plans for on-going wound care, weight 245 lbs.  Received Lasix 80 mg IV + metolazone 2.5 at home for volume overload 12/13/21 via [facility]. No improvement in symptoms and she was advised to go to ED. Received further IV Lasix and felt  better. Discharged home.   Admitted 12/22/21 for respiratory distress requiring intubation after her AVF creation surgery.  Peripheral arterial dopplers in 1/23 with moderate right and severe left SFA stenosis.    Today she returns for HF follow up with her husband. She is able to get around her house without marked dyspnea, but does not do much more than this. She continues with daily dressing changes to LLE wound and wears a boot. Dr. Gwenlyn Found has seen, is considering intervention on left leg (see peripheral arterial dopplers above).  Continues with significant LE edema. Denies CP, dizziness,or PND/Orthopnea. Appetite ok. No fever or chills. Weight at home better at 234 lbs. Taking all medications. Followed by [facility] Nira Conn).  ECG (personally reviewed): none ordered today.  Labs (1/23): K 3.6, creatinine 4.27 Labs (2/23): K 4.3, creatinine 5.31  PMH:  1. CKD stage IV 2. Chronic diastolic CHF: PYP scan (6/62).  - RHC (7/22): mean RA 13, PA 68/22 mean 42, mean PCWP 25, CI 2.65, PVR 3.1. - Echo (10/22): EF 50-55%, moderate LVH, normal RV size and systolic function, normal IVC.  3. HTN 4. Peripheral arterial disease:  - Peripheral arterial dopplers (1/23): right 50-74% distal SFA, left 75-99% SFA, bilateral tibial disease.  5. Carotid stenosis: Dopplers (5/22) with totally occluded RICA, 94-76% LICA.  6. Former smoker.  7. Type 2 diabetes 8. CAD: LHC (5/22) with 50% mLAD.  9. CVA: 2020.  10. Hyperlipidemia 11. Depression  ROS: All systems reviewed and negative except as per HPI.   Current Outpatient Medications  Medication Sig Dispense Refill   acetaminophen (TYLENOL) 500 MG tablet Take 500 mg by mouth every 6 (six) hours as needed for moderate pain or headache.     amLODipine (NORVASC) 10 MG tablet Take 1 tablet (10 mg total) by mouth daily. 30 tablet 6   aspirin 81 MG EC tablet Take 1 tablet (81 mg total) by mouth daily. 60 tablet 1   atorvastatin (LIPITOR) 40 MG tablet Take 1  tablet (40 mg total) by mouth daily. 30 tablet 6   Blood Glucose Monitoring Suppl (ONETOUCH VERIO) w/Device KIT Check blood sugar three times daily. E11.40 1 kit 0   calcium acetate (PHOSLO) 667 MG capsule TAKE 1 CAPSULE (667 MG TOTAL) BY MOUTH 3 (THREE) TIMES DAILY WITH MEALS. 90 capsule 3   carvedilol (COREG) 12.5 MG tablet Take 1.5 tablets (18.75 mg total) by mouth 2 (two) times daily with a meal. 90 tablet 6   Continuous Blood Gluc Receiver (DEXCOM G6 RECEIVER) DEVI 1 Device by Does not apply route daily. 1 each 0   Continuous Blood Gluc Sensor (DEXCOM G6 SENSOR) MISC 1 packet by Does not apply route daily. 3 each 11   Continuous Blood Gluc Transmit (DEXCOM G6 TRANSMITTER) MISC 1 packet by Does not apply route daily. 1 each 4   ferrous sulfate 325 (65 FE) MG tablet Take 1 tablet (325 mg total) by mouth daily with breakfast. 100 tablet 1   glucose blood test strip Use as instructedCheck blood sugar three times daily. E11.40 100 each 12   insulin glargine (LANTUS) 100 UNIT/ML Solostar Pen Inject 15 Units into the skin at bedtime. 15 mL 0   insulin lispro (HUMALOG  KWIKPEN) 100 UNIT/ML KwikPen Inject 4 units before breakfast and dinner. (Patient taking differently: Inject 4 Units into the skin in the morning and at bedtime.) 15 mL 3   Insulin Pen Needle 32G X 4 MM MISC Use to inject insulin as directed. 100 each 4   risperiDONE (RISPERDAL) 3 MG tablet TAKE 1 TABLET (3 MG TOTAL) BY MOUTH AT BEDTIME. (Patient taking differently: Take 3 mg by mouth at bedtime.) 30 tablet 2   No current facility-administered medications for this visit.   Allergies  Allergen Reactions   Hydralazine Hcl Other (See Comments)    Hair loss   Hydrocodone Itching and Other (See Comments)    Upset stomach   Metformin And Related Nausea And Vomiting and Other (See Comments)    Stomach pains, also   Other Nausea Only and Other (See Comments)    Lettuce- Does not digest this!!   Plaquenil [Hydroxychloroquine Sulfate]  Hives   Shellfish-Derived Products Nausea Only and Other (See Comments)    Caused an upset stomach   Shrimp (Diagnostic) Nausea Only and Other (See Comments)    Upset stomach    Sulfa Antibiotics Hives   Social History   Socioeconomic History   Marital status: Legally Separated    Spouse name: [name]   Number of children: 1   Years of education: some colle   Highest education level: Not on file  Occupational History   Occupation: Employed FT as Landscape architect    Comment: [employer] , NA  Tobacco Use   Smoking status: Former    Packs/day: 0.25    Years: 0.50    Pack years: 0.13    Types: Cigarettes   Smokeless tobacco: Never   Tobacco comments:    Former smoker 11/03/21  Vaping Use   Vaping Use: Never used  Substance and Sexual Activity   Alcohol use: No   Drug use: No   Sexual activity: Yes    Birth control/protection: None  Other Topics Concern   Not on file  Social History Narrative   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    Social Determinants of Health   Financial Resource Strain: Low Risk    Difficulty of Paying Living Expenses: Not very hard  Food Insecurity: No Food Insecurity   Worried About Charity fundraiser in the Last Year: Never true   Ran Out of Food in the Last Year: Never true  Transportation Needs: No Transportation Needs   Lack of Transportation (Medical): No   Lack of Transportation (Non-Medical): No  Physical Activity: Not on file  Stress: Not on file  Social Connections: Not on file  Intimate Partner Violence: Not on file   Family History  Problem Relation Age of Onset   Hypertension Mother    Heart disease Mother    Diabetes Mother    Thyroid disease Mother    Congestive Heart Failure Mother    Breast cancer Maternal Grandmother    Colon cancer Maternal Grandfather    Heart attack Sister    Heart disease Brother    Hyperlipidemia Brother    Hypertension Brother    Diabetes Father    Breast  cancer Maternal Aunt    There were no vitals taken for this visit.  Wt Readings from Last 3 Encounters:  02/20/22 95.8 kg  02/14/22 97.5 kg  02/07/22 101.2 kg   General:  NAD. No resp difficulty HEENT: Normal Neck: Supple. JVP 10-12. Carotids 2+ bilat; no bruits. No  lymphadenopathy or thryomegaly appreciated. Cor: PMI nondisplaced. Regular rate & rhythm. No rubs, gallops or murmurs. Lungs: Clear Abdomen: Obese, nontender, nondistended. No hepatosplenomegaly. No bruits or masses. Good bowel sounds. Extremities: No cyanosis, clubbing, rash, 2-3+ BLE edema to knees; left foot in ortho boot. Neuro: Alert & oriented x 3, cranial nerves grossly intact. Moves all 4 extremities w/o difficulty. Flat pleasant.  ASSESSMENT & PLAN: 1. Acute on chronic diastolic CHF: Echo in 5/05 showed EF 55-60%, moderate LVH, normal RV, dilated IVC.  R/LHC in 5/22 showed nonobstructive CAD, pulmonary hypertension .  Multiple admissions recently with CHF, complicated by CKD stage IV.  [place] in 7/22 suggested ongoing volume overload with elevated PCWP and RA.  Prominent v-waves in PCWP but think due to severe diastolic dysfunction as there is no significant MR on review of recent echo.  PYP study in 7/22 was not suggestive of TTR cardiac amyloidosis.  Suspect severe diastolic dysfunction due to long-standing and poorly controlled HTN.  Admit for diastolic CHF 69/79 & 48/01 w/ fluid overload, c/b CKD. NYHA 3-4, functional class difficult due to LLE wound and deconditioning. She is volume overloaded today.  Management limited by CKD IV.  Suspect she is nearing ESRD.  - Continue torsemide to 100 mg bid.  - Increase metolazone to three times weekly, on Monday, Wednesday and Friday. She will take KCl 40 with each metolazone dose. BMET/BNP today, repeat BMET in 10 days. - Continue with paramedicine. 2. CAD: LHC on 5/22, she has mild to moderate segmental proximal mid and distal LAD disease. Her circumflex and RCA did not have  significant disease.  No chest pain.  - Continue ASA - Continue atorvastatin 40 mg daily, LDL102 (1/23). 3. CKD stage IV: Renal US with no evidence of obstruction. Followed by nephrology. Now s/p AVF creation. She sees Dr. Hollie Salk 02/22/22. Likely nearing HD soon. 4. PAD: Now with bilateral gangrenous pressure ulcers, L >R. Evaluated by vascular and orthopedic surgery during recent admissions, recommend transtibial amputation however patient declined this, opting for non-surgical healing.  Peripheral arterial dopplers in 1/23 with moderate SFA stenosis on right and severe on left, she has bilateral tibial disease.  - To followup with Dr. Gwenlyn Found to evaluate for peripheral intervention.  Concern about giving her contrast load with advanced CKD, but she is likely to progress to HD soon regardless of contrast bolus.  - Seen at [facility], wounds healing per patient and husband. - Continue aspirin + statin. - Continue offloading heels, has boot on left foot.  5. HLD: LDL 102 1/23. 6. HTN: Controlled. - Continue carvedilol 18.75 mg bid. - Continue amlodipine 10 mg daily. 7. Carotid disease: Occluded R ICA, moderate L ICA. - Continue ASA, statin.  Follow up in 4 weeks with APP and 12 weeks with Dr. Wynema Birch [facility] FNP 02/23/2022 8:13 AM]

## 2022-02-23 NOTE — Progress Notes (Signed)
Entry in error

## 2022-02-23 NOTE — Telephone Encounter (Signed)
Transition Care Management Unsuccessful Follow-up Telephone Call ?  ?Date of discharge and from where:  02/21/2022, [facility]  ?  ?Attempts:  2nd Attempt ?  ?Reason for unsuccessful TCM follow-up call:  Left voice message # [phone] ?  ?  ?

## 2022-02-26 ENCOUNTER — Encounter (HOSPITAL_COMMUNITY): Payer: Medicaid Other

## 2022-02-26 ENCOUNTER — Telehealth: Payer: Self-pay

## 2022-02-26 NOTE — Telephone Encounter (Signed)
Transition Care Management Unsuccessful Follow-up Telephone Call ? ?Date of discharge and from where:  02/21/2022, [facility] ? ?Attempts:  3rd Attempt ? ?Reason for unsuccessful TCM follow-up call:  Left voice message on # [phone]. ?Call back requested.  ?Need to discuss scheduling a hospital follow up appointment with PCP.  ? ? ? ?

## 2022-02-28 ENCOUNTER — Telehealth: Payer: Self-pay

## 2022-02-28 NOTE — Telephone Encounter (Signed)
Letter sent to patient instructing her to contact [facility] to schedule a hospital follow up appointment as we have not been able to reach her  ?

## 2022-03-01 ENCOUNTER — Telehealth (HOSPITAL_COMMUNITY): Payer: Self-pay | Admitting: Licensed Clinical Social Worker

## 2022-03-01 NOTE — Telephone Encounter (Signed)
CSW called pt this morning to check in on PCS services status- unable to reach- left Vm requesting return call ? ?Jorge Ny, LCSW ?Clinical Social Worker ?[facility] ?Desk#: [phone] ?Cell#: [phone] ? ?

## 2022-03-05 ENCOUNTER — Telehealth (HOSPITAL_COMMUNITY): Payer: Self-pay | Admitting: Licensed Clinical Social Worker

## 2022-03-05 NOTE — Telephone Encounter (Signed)
CSW called pt to check in.  Reports she has not heard back regarding PCS services at this time. ? ?CSW called [employer] and confirmed that agency had been changed to [facility] [employer] and referral was sent on 3/10.  Per rep referrals are supposed to be responded to in 48 hours but not answer provided. ? ?CSW called [facility] rep and they will look up referral and get back with me on status ? ?Jorge Ny, LCSW ?Clinical Social Worker ?[facility] ?Desk#: [phone] ?Cell#: [phone] ? ?

## 2022-03-08 ENCOUNTER — Other Ambulatory Visit (HOSPITAL_COMMUNITY): Payer: Self-pay

## 2022-03-08 NOTE — Progress Notes (Signed)
Paramedicine Encounter ? ? ? Patient ID: Nancy [name], female    DOB: 1961/05/27, 61 y.o.   MRN: [id number] ? ? ?Arrived for home visit for Nancy [name] who reports to be feeling good with no complaints today aside from anxiety related to going to dialysis. Nancy [name] asked if I could reach out to Dr. Wynetta Emery about something for anxiety due to her being very anxious and nervous before and during dialysis treatments. I will reach out to Dr. Wynetta Emery.  ? ?Vitals and assessment obtained. Some mild lower leg swelling on the left lower leg. Lungs clear.  ? ?Dexcom replaced. Needs new sensors.  ?I will order from [facility].  ? ?Mrs. And Mr. [name] report dialysis center took her off amlodipine and carvedilol. Same was kept out of pill box.  ? ?I reviewed meds and filled two pill boxes for two weeks for Nancy [name].  ? ?She and her husband report that they are moving to [place]. They intend to move in on or around 4/1.  ? ?I spoke to [facility] [place] today and suggested 3 more home health PCS agencies for Nancy [name] as the first three rejected her. I will continue to assist.  ? ?Appointments were wrote down and I reminded them of same. Home visit complete. I will see Nancy [name] in two weeks.  ? ?Refills: ?Aspirin ?Calcium ?Atorvastatin ?Dexcom Sensors  ? ? ? ?Patient Care Team: ?[name], MD as PCP - General (Internal Medicine) ?[name], MD as PCP - Cardiology (Cardiology) ?Larey Dresser, MD as PCP - Advanced Heart Failure (Cardiology) ? ?Patient Active Problem List  ? Diagnosis Date Noted  ? Pressure injury of skin 02/19/2022  ? Renal anasarca 02/14/2022  ? ESRD (end stage renal disease) ([place]) 02/14/2022  ? Hyperkalemia 02/14/2022  ? S/P arteriovenous (AV) fistula creation 12/22/2021  ? Critical limb ischemia of both lower extremities ([place]) 11/22/2021  ? Osteomyelitis ([place])   ? Peripheral artery disease ([place]) 10/27/2021  ? Acute exacerbation of congestive heart failure ([place])  10/24/2021  ? Acute cardiogenic pulmonary edema ([place]) 10/06/2021  ? Coronary artery disease involving native coronary artery of native heart without angina pectoris 10/06/2021  ? Goals of care, counseling/discussion   ? Gangrene of left foot ([place])   ? Cellulitis of left foot   ? Diabetic ulcer of left foot ([place]) 09/04/2021  ? CHF (congestive heart failure) ([place]) 07/07/2021  ? Normocytic anemia 06/30/2021  ? CKD (chronic kidney disease), stage IV ([place]) 06/11/2021  ? Acute on chronic diastolic CHF (congestive heart failure) ([place]) 06/10/2021  ? Abnormal nuclear stress test   ? Dyspnea on exertion 03/07/2021  ? Orthopnea 02/25/2021  ? History of cerebrovascular accident (CVA) with residual deficit 05/06/2020  ? Diabetic peripheral neuropathy ([place]) 04/26/2020  ? AKI (acute kidney injury) ([place]) 04/20/2020  ? Generalized weakness 04/20/2020  ? Dehydration 04/20/2020  ? Generalized abdominal pain   ? Pancreatitis, acute 02/29/2020  ? Cerebrovascular accident (CVA) ([place]) 11/08/2019  ? Stenosis of right carotid artery 11/08/2019  ? Mixed diabetic hyperlipidemia associated with type 2 diabetes mellitus ([place]) 11/08/2019  ? Cortical age-related cataract of both eyes 08/30/2019  ? Gait abnormality 08/30/2019  ? Statin declined 08/30/2019  ? Gastroesophageal reflux disease without esophagitis 04/18/2018  ? 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 Type 2 diabetes mellitus with stage 4 chronic kidney disease, with long-term current use of insulin ([place]) 10/29/2017  ? History of macular degeneration 10/29/2017  ? Chronic cervical pain 10/29/2016  ? Myalgia  09/14/2016  ? Insomnia 09/14/2016  ? Tinea pedis of both feet 08/20/2016  ? Macular degeneration 08/17/2016  ? Low back pain 09/21/2014  ? Hypertensive urgency 09/21/2014  ? Diabetic gastroparesis associated with type 2 diabetes mellitus ([place]) 09/14/2014  ? Uncontrolled hypertension 09/08/2014  ? Thyroid nodule 08/17/2014  ? Obesity (BMI 30-39.9) 08/17/2014  ? Nausea & vomiting 08/04/2014  ?  Type II diabetes mellitus with neurological manifestations, uncontrolled 08/04/2014  ? ? ?Current Outpatient Medications:  ?  acetaminophen (TYLENOL) 500 MG tablet, Take 500 mg by mouth every 6 (six) hours as needed for moderate pain or headache., Disp: , Rfl:  ?  amLODipine (NORVASC) 10 MG tablet, Take 1 tablet (10 mg total) by mouth daily., Disp: 30 tablet, Rfl: 6 ?  aspirin 81 MG EC tablet, Take 1 tablet (81 mg total) by mouth daily., Disp: 60 tablet, Rfl: 1 ?  atorvastatin (LIPITOR) 40 MG tablet, Take 1 tablet (40 mg total) by mouth daily., Disp: 30 tablet, Rfl: 6 ?  Blood Glucose Monitoring Suppl (ONETOUCH VERIO) w/Device KIT, Check blood sugar three times daily. E11.40, Disp: 1 kit, Rfl: 0 ?  calcium acetate (PHOSLO) 667 MG capsule, TAKE 1 CAPSULE (667 MG TOTAL) BY MOUTH 3 (THREE) TIMES DAILY WITH MEALS., Disp: 90 capsule, Rfl: 3 ?  carvedilol (COREG) 12.5 MG tablet, Take 1.5 tablets (18.75 mg total) by mouth 2 (two) times daily with a meal., Disp: 90 tablet, Rfl: 6 ?  Continuous Blood Gluc Receiver (DEXCOM G6 RECEIVER) DEVI, 1 Device by Does not apply route daily., Disp: 1 each, Rfl: 0 ?  Continuous Blood Gluc Sensor (DEXCOM G6 SENSOR) MISC, 1 packet by Does not apply route daily., Disp: 3 each, Rfl: 11 ?  Continuous Blood Gluc Transmit (DEXCOM G6 TRANSMITTER) MISC, 1 packet by Does not apply route daily., Disp: 1 each, Rfl: 4 ?  ferrous sulfate 325 (65 FE) MG tablet, Take 1 tablet (325 mg total) by mouth daily with breakfast., Disp: 100 tablet, Rfl: 1 ?  glucose blood test strip, Use as instructedCheck blood sugar three times daily. E11.40, Disp: 100 each, Rfl: 12 ?  insulin glargine (LANTUS) 100 UNIT/ML Solostar Pen, Inject 15 Units into the skin at bedtime., Disp: 15 mL, Rfl: 0 ?  insulin lispro (HUMALOG KWIKPEN) 100 UNIT/ML KwikPen, Inject 4 units before breakfast and dinner. (Patient taking differently: Inject 4 Units into the skin in the morning and at bedtime.), Disp: 15 mL, Rfl: 3 ?  Insulin Pen  Needle 32G X 4 MM MISC, Use to inject insulin as directed., Disp: 100 each, Rfl: 4 ?  risperiDONE (RISPERDAL) 3 MG tablet, TAKE 1 TABLET (3 MG TOTAL) BY MOUTH AT BEDTIME. (Patient taking differently: Take 3 mg by mouth at bedtime.), Disp: 30 tablet, Rfl: 2 ?Allergies  ?Allergen Reactions  ? Hydralazine Hcl Other (See Comments)  ?  Hair loss  ? Hydrocodone Itching and Other (See Comments)  ?  Upset stomach  ? Metformin And Related Nausea And Vomiting and Other (See Comments)  ?  Stomach pains, also  ? Other Nausea Only and Other (See Comments)  ?  Lettuce- Does not digest this!!  ? Plaquenil [Hydroxychloroquine Sulfate] Hives  ? Shellfish-Derived Products Nausea Only and Other (See Comments)  ?  Caused an upset stomach  ? Shrimp (Diagnostic) Nausea Only and Other (See Comments)  ?  Upset stomach   ? Sulfa Antibiotics Hives  ? ? ? ?Social History  ? ?Socioeconomic History  ? Marital status: Legally Separated  ?  Spouse name: [name]  ? Number of children: 1  ? Years of education: some colle  ? Highest education level: Not on file  ?Occupational History  ? Occupation: Employed FT as Landscape architect  ?  Comment: [employer] , [place]  ?Tobacco Use  ? Smoking status: Former  ?  Packs/day: 0.25  ?  Years: 0.50  ?  Pack years: 0.13  ?  Types: Cigarettes  ? Smokeless tobacco: Never  ? Tobacco comments:  ?  Former smoker 11/03/21  ?Vaping Use  ? Vaping Use: Never used  ?Substance and Sexual Activity  ? Alcohol use: No  ? Drug use: No  ? Sexual activity: Yes  ?  Birth control/protection: None  ?Other Topics Concern  ? Not on file  ?Social History Narrative  ? 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 ?Social Determinants of Health  ? ?Financial Resource Strain: Low Risk   ? Difficulty of Paying Living Expenses: Not very hard  ?Food Insecurity: No Food Insecurity  ? Worried About Charity fundraiser in the Last Year: Never true  ? Ran Out of Food in the Last Year: Never true  ?Transportation Needs: No  Transportation Needs  ? Lack of Transportation (Medical): No  ? Lack of Transportation (Non-Medical): No  ?Physical Activity: Not on file  ?Stress: Not on file  ?Social Connections: Not on file  ?Intimate Partner V

## 2022-03-13 ENCOUNTER — Other Ambulatory Visit (HOSPITAL_COMMUNITY): Payer: Self-pay

## 2022-03-14 ENCOUNTER — Encounter (HOSPITAL_COMMUNITY): Payer: Medicaid Other

## 2022-03-18 ENCOUNTER — Other Ambulatory Visit: Payer: Self-pay

## 2022-03-18 ENCOUNTER — Emergency Department (HOSPITAL_COMMUNITY): Payer: Medicare Other

## 2022-03-18 ENCOUNTER — Emergency Department (HOSPITAL_COMMUNITY)
Admission: EM | Admit: 2022-03-18 | Discharge: 2022-03-18 | Disposition: A | Discharge status: Home / Self Care | Payer: Medicare Other | Attending: Emergency Medicine | Admitting: Emergency Medicine

## 2022-03-18 ENCOUNTER — Encounter (HOSPITAL_COMMUNITY): Payer: Self-pay | Admitting: Emergency Medicine

## 2022-03-18 DIAGNOSIS — E876 Hypokalemia: Secondary | ICD-10-CM | POA: Diagnosis not present

## 2022-03-18 DIAGNOSIS — D631 Anemia in chronic kidney disease: Secondary | ICD-10-CM | POA: Diagnosis not present

## 2022-03-18 DIAGNOSIS — E1122 Type 2 diabetes mellitus with diabetic chronic kidney disease: Secondary | ICD-10-CM | POA: Insufficient documentation

## 2022-03-18 DIAGNOSIS — E871 Hypo-osmolality and hyponatremia: Secondary | ICD-10-CM | POA: Diagnosis not present

## 2022-03-18 DIAGNOSIS — R059 Cough, unspecified: Secondary | ICD-10-CM | POA: Insufficient documentation

## 2022-03-18 DIAGNOSIS — R400 Somnolence: Secondary | ICD-10-CM | POA: Diagnosis not present

## 2022-03-18 DIAGNOSIS — D72829 Elevated white blood cell count, unspecified: Secondary | ICD-10-CM | POA: Insufficient documentation

## 2022-03-18 DIAGNOSIS — N186 End stage renal disease: Secondary | ICD-10-CM | POA: Insufficient documentation

## 2022-03-18 DIAGNOSIS — Z7982 Long term (current) use of aspirin: Secondary | ICD-10-CM | POA: Diagnosis not present

## 2022-03-18 DIAGNOSIS — D649 Anemia, unspecified: Secondary | ICD-10-CM

## 2022-03-18 DIAGNOSIS — R4182 Altered mental status, unspecified: Secondary | ICD-10-CM | POA: Diagnosis present

## 2022-03-18 LAB — CBG MONITORING, ED
Glucose-Capillary: 200 mg/dL — ABNORMAL HIGH (ref 70–99)
Glucose-Capillary: 184 mg/dL — ABNORMAL HIGH (ref 70–99)

## 2022-03-18 LAB — CBC
HCT: 24.5 % — ABNORMAL LOW (ref 36.0–46.0)
Hemoglobin: 7.7 g/dL — ABNORMAL LOW (ref 12.0–15.0)
MCH: 28.3 pg (ref 26.0–34.0)
MCHC: 31.4 g/dL (ref 30.0–36.0)
MCV: 90.1 fL (ref 80.0–100.0)
Platelets: 404 10*3/uL — ABNORMAL HIGH (ref 150–400)
RBC: 2.72 MIL/uL — ABNORMAL LOW (ref 3.87–5.11)
RDW: 15 % (ref 11.5–15.5)
WBC: 19.1 10*3/uL — ABNORMAL HIGH (ref 4.0–10.5)
nRBC: 0 % (ref 0.0–0.2)

## 2022-03-18 LAB — COMPREHENSIVE METABOLIC PANEL
ALT: 12 U/L (ref 0–44)
AST: 13 U/L — ABNORMAL LOW (ref 15–41)
Albumin: 2.5 g/dL — ABNORMAL LOW (ref 3.5–5.0)
Alkaline Phosphatase: 58 U/L (ref 38–126)
Anion gap: 12 (ref 5–15)
BUN: 18 mg/dL (ref 8–23)
CO2: 30 mmol/L (ref 22–32)
Calcium: 8.8 mg/dL — ABNORMAL LOW (ref 8.9–10.3)
Chloride: 91 mmol/L — ABNORMAL LOW (ref 98–111)
Creatinine, Ser: 4.6 mg/dL — ABNORMAL HIGH (ref 0.44–1.00)
GFR, Estimated: 10 mL/min — ABNORMAL LOW (ref >60)
Glucose, Bld: 211 mg/dL — ABNORMAL HIGH (ref 70–99)
Potassium: 3.1 mmol/L — ABNORMAL LOW (ref 3.5–5.1)
Sodium: 133 mmol/L — ABNORMAL LOW (ref 135–145)
Total Bilirubin: 0.5 mg/dL (ref 0.3–1.2)
Total Protein: 7.8 g/dL (ref 6.5–8.1)

## 2022-03-18 LAB — AMMONIA: Ammonia: 21 umol/L (ref 9–35)

## 2022-03-18 LAB — LACTIC ACID, PLASMA: Lactic Acid, Venous: 1.5 mmol/L (ref 0.5–1.9)

## 2022-03-18 NOTE — ED Notes (Signed)
Patient transported to CT 

## 2022-03-18 NOTE — ED Provider Notes (Signed)
D ?Provider Note ?MRN:  [id number]  ?Arrival date & time: 03/18/22    ?ED Course and Medical Decision Making  ?Assumed care from Dr Melina Copa at shift change. ? ?See not from prior team for complete details, in brief:  ?ESRD on HD MWF ?Pt blood sugar low at home per spouse ?Sleepy  ?Labs similar to baseline ?Husband to take her home ? ?Labs are stable.  CT stable.  Patient at her baseline currently ? ?She is interactive, able to eat a full meal, VSS. Reports that overall she feels like she is doing well, back to her baseline and is requesting to go home. ? ?Husband to take her home. ? ? ?The patient improved significantly and was discharged in stable condition. Detailed discussions were had with the patient regarding current findings, and need for close f/u with PCP or on call doctor. The patient has been instructed to return immediately if the symptoms worsen in any way for re-evaluation. Patient verbalized understanding and is in agreement with current care plan. All questions answered prior to discharge. ? ? ?Procedures ? ?Final Clinical Impressions(s) / ED Diagnoses  ? ?  ICD-10-CM   ?1. Somnolence  R40.0   ?  ?2. Anemia, unspecified type  D64.9   ?  ?3. ESRD (end stage renal disease) ([place])  N18.6   ?  ?  ?ED Discharge Orders   ? ? None  ? ?  ?  ? ? ?Discharge Instructions   ? ?  ?You are seen in the emergency department for elevated blood sugars and possible altered mental status.  Your lab work showed you to be more anemic.  Your chest x-ray and CAT scan of your head did not show any acute findings.  Please follow-up with your dialysis center tomorrow.  They will need to be following your blood counts to make sure you do not need a transfusion.  Return if any worsening or concerning symptoms. ? ? ? ? ?  ?Jeanell Sparrow, DO ?03/18/22 2013 ? ?

## 2022-03-18 NOTE — ED Provider Notes (Signed)
?[facility] ?Provider Note ? ? ?CSN: [id number] ?Arrival date & time: 03/18/22  0407 ? ?  ? ?History ? ?Chief Complaint  ?Patient presents with  ? Altered Mental Status  ? ? ?PAYTIENCE BURES is a 61 y.o. female.  Level 5 caveat for altered mental status.  61 year old female with history of end-stage renal disease dialysis Monday Wednesday Friday.  She is not sure why she is here.  Per the triage note husband states she has been more lethargic for the last few days.  Slow to respond.  Blood sugars have been elevated.  Patient herself denies any complaints.  She said she went to dialysis on Friday and knows it is Sunday.  Denies any pain.  Does endorse a cough. ? ?The history is provided by the patient.  ?Altered Mental Status ?Presenting symptoms: lethargy   ?Most recent episode:  2 days ago ?Episode history:  Continuous ?Timing:  Constant ?Progression:  Unchanged ?Chronicity:  New ?Associated symptoms: no abdominal pain, no difficulty breathing, no headaches and no vomiting   ? ?  ? ?Home Medications ?Prior to Admission medications   ?Medication Sig Start Date End Date Taking? Authorizing Provider  ?acetaminophen (TYLENOL) 500 MG tablet Take 500 mg by mouth every 6 (six) hours as needed for moderate pain or headache.    [provider]  ?amLODipine (NORVASC) 10 MG tablet Take 1 tablet (10 mg total) by mouth daily. ?Patient not taking: Reported on 03/08/2022 11/07/21   Larey Dresser, MD  ?aspirin 81 MG EC tablet Take 1 tablet (81 mg total) by mouth daily. 09/26/21   Rafael Bihari, FNP  ?atorvastatin (LIPITOR) 40 MG tablet Take 1 tablet (40 mg total) by mouth daily. 11/07/21   Larey Dresser, MD  ?Blood Glucose Monitoring Suppl (ONETOUCH VERIO) w/Device KIT Check blood sugar three times daily. E11.40 05/06/20   [name], MD  ?calcium acetate (PHOSLO) 667 MG capsule TAKE 1 CAPSULE (667 MG TOTAL) BY MOUTH 3 (THREE) TIMES DAILY WITH MEALS. 02/08/22    [name], MD  ?carvedilol (COREG) 12.5 MG tablet Take 1.5 tablets (18.75 mg total) by mouth 2 (two) times daily with a meal. ?Patient not taking: Reported on 03/08/2022 11/07/21   Larey Dresser, MD  ?Continuous Blood Gluc Receiver (DEXCOM G6 RECEIVER) DEVI 1 Device by Does not apply route daily. 11/15/21   [name], MD  ?Continuous Blood Gluc Sensor (DEXCOM G6 SENSOR) MISC 1 packet by Does not apply route daily. 11/15/21   [name], MD  ?Continuous Blood Gluc Transmit (DEXCOM G6 TRANSMITTER) MISC 1 packet by Does not apply route daily. 11/15/21   [name], MD  ?ferrous sulfate 325 (65 FE) MG tablet Take 1 tablet (325 mg total) by mouth daily with breakfast. 11/07/21   [name], MD  ?glucose blood test strip Use as instructedCheck blood sugar three times daily. E11.40 07/11/20   [name], MD  ?insulin glargine (LANTUS) 100 UNIT/ML Solostar Pen Inject 15 Units into the skin at bedtime. 02/21/22   Darliss Cheney, MD  ?insulin lispro (HUMALOG KWIKPEN) 100 UNIT/ML KwikPen Inject 4 units before breakfast and dinner. ?Patient taking differently: Inject 4 Units into the skin in the morning and at bedtime. 11/07/21   [name], MD  ?Insulin Pen Needle 32G X 4 MM MISC Use to inject insulin as directed. 11/07/21   [name], MD  ?risperiDONE (RISPERDAL) 3 MG tablet TAKE 1  TABLET (3 MG TOTAL) BY MOUTH AT BEDTIME. ?Patient taking differently: Take 3 mg by mouth at bedtime. 01/15/22 01/15/23  [name], MD  ?Insulin NPH Isophane & Regular (RELION 70/30 [place]) Inject 35 Units into the skin 2 (two) times daily.  08/17/14  [provider]  ?   ? ?Allergies    ?Hydralazine hcl, Hydrocodone, Metformin and related, Other, Plaquenil [hydroxychloroquine sulfate], Shellfish-derived products, Shrimp (diagnostic), and Sulfa antibiotics   ? ?Review of Systems   ?Review of Systems  ?Unable to perform ROS: Mental status change  ?Gastrointestinal:  Negative  for abdominal pain and vomiting.  ?Neurological:  Negative for headaches.  ? ?Physical Exam ?Updated Vital Signs ?BP 123/73   Pulse 93   Temp 99.7 ?F (37.6 ?C) (Oral)   Resp 18   SpO2 97%  ?Physical Exam ?Vitals and nursing note reviewed.  ?Constitutional:   ?   General: She is not in acute distress. ?   Appearance: Normal appearance. She is well-developed.  ?HENT:  ?   Head: Normocephalic and atraumatic.  ?Eyes:  ?   Conjunctiva/sclera: Conjunctivae normal.  ?Cardiovascular:  ?   Rate and Rhythm: Normal rate and regular rhythm.  ?   Heart sounds: No murmur heard. ?Pulmonary:  ?   Effort: Pulmonary effort is normal. No respiratory distress.  ?   Breath sounds: Normal breath sounds.  ?   Comments: She has dialysis catheter to her right upper chest.  She also has a fistula in her right upper arm ?Abdominal:  ?   Palpations: Abdomen is soft.  ?   Tenderness: There is no abdominal tenderness. There is no guarding or rebound.  ?Musculoskeletal:     ?   General: No swelling.  ?   Cervical back: Neck supple.  ?   Right lower leg: Edema present.  ?   Left lower leg: Edema present.  ?Skin: ?   General: Skin is warm and dry.  ?   Capillary Refill: Capillary refill takes less than 2 seconds.  ?Neurological:  ?   General: No focal deficit present.  ?   Mental Status: She is easily aroused.  ? ? ?ED Results / Procedures / Treatments   ?Labs ?(all labs ordered are listed, but only abnormal results are displayed) ?Labs Reviewed  ?COMPREHENSIVE METABOLIC PANEL - Abnormal; Notable for the following components:  ?    Result Value  ? Sodium 133 (*)   ? Potassium 3.1 (*)   ? Chloride 91 (*)   ? Glucose, Bld 211 (*)   ? Creatinine, Ser 4.60 (*)   ? Calcium 8.8 (*)   ? Albumin 2.5 (*)   ? AST 13 (*)   ? GFR, Estimated 10 (*)   ? All other components within normal limits  ?CBC - Abnormal; Notable for the following components:  ? WBC 19.1 (*)   ? RBC 2.72 (*)   ? Hemoglobin 7.7 (*)   ? HCT 24.5 (*)   ? Platelets 404 (*)   ? All other  components within normal limits  ?CBG MONITORING, ED - Abnormal; Notable for the following components:  ? Glucose-Capillary 200 (*)   ? All other components within normal limits  ?CBG MONITORING, ED - Abnormal; Notable for the following components:  ? Glucose-Capillary 184 (*)   ? All other components within normal limits  ?CULTURE, BLOOD (ROUTINE X 2)  ?CULTURE, BLOOD (ROUTINE X 2)  ?AMMONIA  ?LACTIC ACID, PLASMA  ?URINALYSIS, ROUTINE W REFLEX MICROSCOPIC  ?LACTIC  ACID, PLASMA  ?CBG MONITORING, ED  ? ? ?EKG ?EKG Interpretation ? ?Date/Time:  Sunday March 18 2022 07:44:28 EDT ?Ventricular Rate:  94 ?PR Interval:  164 ?QRS Duration: 87 ?QT Interval:  354 ?QTC Calculation: 443 ?R Axis:   -25 ?Text Interpretation: Sinus rhythm Borderline left axis deviation Borderline T wave abnormalities No significant change since prior 3/23 Confirmed by Aletta Edouard [phone]) on 03/18/2022 7:46:37 AM ? ?Radiology ?CT Head Wo Contrast ? ?Result Date: 03/18/2022 ?CLINICAL DATA:  61 year old female with cough and altered mental status. Hyperglycemia. EXAM: CT HEAD WITHOUT CONTRAST TECHNIQUE: Contiguous axial images were obtained from the base of the skull through the vertex without intravenous contrast. RADIATION DOSE REDUCTION: This exam was performed according to the departmental dose-optimization program which includes automated exposure control, adjustment of the mA and/or kV according to patient size and/or use of iterative reconstruction technique. COMPARISON:  Brain MRI 05/04/2020. Head CT 10/08/2019. FINDINGS: Brain: Chronic cortical encephalomalacia in the right parietal lobe appears stable since 2021, extended from the 2020 CT. Superimposed chronic bilateral occipital lobe cortical encephalomalacia is stable. No midline shift, ventriculomegaly, mass effect, evidence of mass lesion, intracranial hemorrhage or evidence of cortically based acute infarction. Chronic partially empty sella. Vascular: No suspicious intracranial  vascular hyperdensity. Skull: Motion artifact at the skull base. Hyperostosis of the calvarium. Chronically expanded bony sella turcica. No acute osseous abnormality identified. Sinuses/Orbits: Visualized paranasal si

## 2022-03-18 NOTE — ED Triage Notes (Addendum)
Per EMS, pt from home, according to husband, pt has been "acting differently since Friday."  EMS states she was A&O X3 but lethargic and slow to respond.  Husband states CBG is 300 which is "extremely high for her."  3 weeks ago, decreased Humalog from 30u to 15.   ? ?BP 160/70 ?HR 90 ?RR 18 ? ?RN found pt very slow to respond, while she knew she was at [facility] she could not tell me why, how she felt or what hurt, just answering yes to all questions.  Pt has noticeable lower bilateral pitting edema. ?

## 2022-03-18 NOTE — Discharge Instructions (Addendum)
You are seen in the emergency department for elevated blood sugars and possible altered mental status.  Your lab work showed you to be more anemic.  Your chest x-ray and CAT scan of your head did not show any acute findings.  Please follow-up with your dialysis center tomorrow.  They will need to be following your blood counts to make sure you do not need a transfusion.  Return if any worsening or concerning symptoms. ?

## 2022-03-22 ENCOUNTER — Other Ambulatory Visit (HOSPITAL_COMMUNITY): Payer: Self-pay

## 2022-03-22 ENCOUNTER — Encounter (HOSPITAL_BASED_OUTPATIENT_CLINIC_OR_DEPARTMENT_OTHER): Payer: Medicare Other | Admitting: General Surgery

## 2022-03-22 NOTE — Progress Notes (Signed)
Paramedicine Encounter ? ? ? Patient ID: Nancy [name], female    DOB: 11-Aug-1961, 61 y.o.   MRN: [id number] ? ? ?Arrived for home visit for [name] where she was alert and oriented but tired on assessment. She seemed very sleepy but easily woken by verbal conversation. Vitals were obtained, sugar noted to be 326. She has her morning dose of insulin but ate cheerios for breakfast, drinking orange soda and had [employer] for lunch. I educated her and her husband the importance of eating fresh fruits and veggies, grilled or baked meats as well as avoiding sodas, juices or high sugar and sodium items. They verbalized understanding.  ? ?I obtained new Dexcom transmitter from [employer] as well as Lantus and pen needles. I advised pharmacist that she needs refills on the following: ?Amlodipine ?Atorvastatin ?Calcium Acetate ?Carvedilol  ? ?I reviewed medications and confirmed same with dialysis center RN. I filled pill box for one week with some medications missing. I made patient and husband aware that missing meds will be delivered by [facility] tomorrow. Notes on where to place these meds were left for husband and he understands where to place same until our next visit in one week.  ? ?Appointments reviewed and confirmed.  ? ?New home address updated. 3 bedrooms one bath home with handicap ramp for her wheelchair. Patient & husband are grateful and happy to be in the home.  ? ?I will follow up in one week.  ? ? ?Patient Care Team: ?[name], MD as PCP - General (Internal Medicine) ?[name], MD as PCP - Cardiology (Cardiology) ?Larey Dresser, MD as PCP - Advanced Heart Failure (Cardiology) ? ?Patient Active Problem List  ? Diagnosis Date Noted  ? Pressure injury of skin 02/19/2022  ? Renal anasarca 02/14/2022  ? ESRD (end stage renal disease) ([place]) 02/14/2022  ? Hyperkalemia 02/14/2022  ? S/P arteriovenous (AV) fistula creation 12/22/2021  ? Critical limb ischemia of both lower extremities ([place])  11/22/2021  ? Osteomyelitis ([place])   ? Peripheral artery disease ([place]) 10/27/2021  ? Acute exacerbation of congestive heart failure ([place]) 10/24/2021  ? Acute cardiogenic pulmonary edema ([place]) 10/06/2021  ? Coronary artery disease involving native coronary artery of native heart without angina pectoris 10/06/2021  ? Goals of care, counseling/discussion   ? Gangrene of left foot ([place])   ? Cellulitis of left foot   ? Diabetic ulcer of left foot ([place]) 09/04/2021  ? CHF (congestive heart failure) ([place]) 07/07/2021  ? Normocytic anemia 06/30/2021  ? CKD (chronic kidney disease), stage IV ([place]) 06/11/2021  ? Acute on chronic diastolic CHF (congestive heart failure) ([place]) 06/10/2021  ? Abnormal nuclear stress test   ? Dyspnea on exertion 03/07/2021  ? Orthopnea 02/25/2021  ? History of cerebrovascular accident (CVA) with residual deficit 05/06/2020  ? Diabetic peripheral neuropathy ([place]) 04/26/2020  ? AKI (acute kidney injury) ([place]) 04/20/2020  ? Generalized weakness 04/20/2020  ? Dehydration 04/20/2020  ? Generalized abdominal pain   ? Pancreatitis, acute 02/29/2020  ? Cerebrovascular accident (CVA) ([place]) 11/08/2019  ? Stenosis of right carotid artery 11/08/2019  ? Mixed diabetic hyperlipidemia associated with type 2 diabetes mellitus ([place]) 11/08/2019  ? Cortical age-related cataract of both eyes 08/30/2019  ? Gait abnormality 08/30/2019  ? Statin declined 08/30/2019  ? Gastroesophageal reflux disease without esophagitis 04/18/2018  ? 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 Type 2 diabetes mellitus with stage 4 chronic kidney disease, with long-term current use of insulin ([place]) 10/29/2017  ? History of  macular degeneration 10/29/2017  ? Chronic cervical pain 10/29/2016  ? Myalgia 09/14/2016  ? Insomnia 09/14/2016  ? Tinea pedis of both feet 08/20/2016  ? Macular degeneration 08/17/2016  ? Low back pain 09/21/2014  ? Hypertensive urgency 09/21/2014  ? Diabetic gastroparesis associated with type 2 diabetes mellitus ([place]) 09/14/2014  ?  Uncontrolled hypertension 09/08/2014  ? Thyroid nodule 08/17/2014  ? Obesity (BMI 30-39.9) 08/17/2014  ? Nausea & vomiting 08/04/2014  ? Type II diabetes mellitus with neurological manifestations, uncontrolled 08/04/2014  ? ? ?Current Outpatient Medications:  ?  acetaminophen (TYLENOL) 500 MG tablet, Take 500 mg by mouth every 6 (six) hours as needed for moderate pain or headache., Disp: , Rfl:  ?  amLODipine (NORVASC) 10 MG tablet, Take 1 tablet (10 mg total) by mouth daily., Disp: 30 tablet, Rfl: 6 ?  aspirin 81 MG EC tablet, Take 1 tablet (81 mg total) by mouth daily., Disp: 60 tablet, Rfl: 1 ?  atorvastatin (LIPITOR) 40 MG tablet, Take 1 tablet (40 mg total) by mouth daily., Disp: 30 tablet, Rfl: 6 ?  Blood Glucose Monitoring Suppl (ONETOUCH VERIO) w/Device KIT, Check blood sugar three times daily. E11.40, Disp: 1 kit, Rfl: 0 ?  calcium acetate (PHOSLO) 667 MG capsule, TAKE 1 CAPSULE (667 MG TOTAL) BY MOUTH 3 (THREE) TIMES DAILY WITH MEALS., Disp: 90 capsule, Rfl: 3 ?  carvedilol (COREG) 12.5 MG tablet, Take 1.5 tablets (18.75 mg total) by mouth 2 (two) times daily with a meal. (Patient not taking: Reported on 03/08/2022), Disp: 90 tablet, Rfl: 6 ?  Continuous Blood Gluc Receiver (DEXCOM G6 RECEIVER) DEVI, 1 Device by Does not apply route daily., Disp: 1 each, Rfl: 0 ?  Continuous Blood Gluc Sensor (DEXCOM G6 SENSOR) MISC, 1 packet by Does not apply route daily., Disp: 3 each, Rfl: 11 ?  Continuous Blood Gluc Transmit (DEXCOM G6 TRANSMITTER) MISC, 1 packet by Does not apply route daily., Disp: 1 each, Rfl: 4 ?  ferrous sulfate 325 (65 FE) MG tablet, Take 1 tablet (325 mg total) by mouth daily with breakfast., Disp: 100 tablet, Rfl: 1 ?  glucose blood test strip, Use as instructedCheck blood sugar three times daily. E11.40, Disp: 100 each, Rfl: 12 ?  insulin glargine (LANTUS) 100 UNIT/ML Solostar Pen, Inject 15 Units into the skin at bedtime., Disp: 15 mL, Rfl: 0 ?  insulin lispro (HUMALOG KWIKPEN) 100 UNIT/ML  KwikPen, Inject 4 units before breakfast and dinner. (Patient taking differently: Inject 4 Units into the skin in the morning and at bedtime.), Disp: 15 mL, Rfl: 3 ?  Insulin Pen Needle 32G X 4 MM MISC, Use to inject insulin as directed., Disp: 100 each, Rfl: 4 ?  risperiDONE (RISPERDAL) 3 MG tablet, TAKE 1 TABLET (3 MG TOTAL) BY MOUTH AT BEDTIME. (Patient taking differently: Take 3 mg by mouth at bedtime.), Disp: 30 tablet, Rfl: 2 ?Allergies  ?Allergen Reactions  ? Hydralazine Hcl Other (See Comments)  ?  Hair loss  ? Hydrocodone Itching and Other (See Comments)  ?  Upset stomach  ? Metformin And Related Nausea And Vomiting and Other (See Comments)  ?  Stomach pains, also  ? Other Nausea Only and Other (See Comments)  ?  Lettuce- Does not digest this!!  ? Plaquenil [Hydroxychloroquine Sulfate] Hives  ? Shellfish-Derived Products Nausea Only and Other (See Comments)  ?  Caused an upset stomach  ? Shrimp (Diagnostic) Nausea Only and Other (See Comments)  ?  Upset stomach   ? Sulfa Antibiotics Hives  ? ? ? ?  Social History  ? ?Socioeconomic History  ? Marital status: Legally Separated  ?  Spouse name: [name]  ? Number of children: 1  ? Years of education: some colle  ? Highest education level: Not on file  ?Occupational History  ? Occupation: Employed FT as Landscape architect  ?  Comment: [employer] , [place]  ?Tobacco Use  ? Smoking status: Former  ?  Packs/day: 0.25  ?  Years: 0.50  ?  Pack years: 0.13  ?  Types: Cigarettes  ? Smokeless tobacco: Never  ? Tobacco comments:  ?  Former smoker 11/03/21  ?Vaping Use  ? Vaping Use: Never used  ?Substance and Sexual Activity  ? Alcohol use: No  ? Drug use: No  ? Sexual activity: Yes  ?  Birth control/protection: None  ?Other Topics Concern  ? Not on file  ?Social History Narrative  ? 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 ?Social Determinants of Health  ? ?Financial Resource Strain: Low Risk   ? Difficulty of Paying Living Expenses: Not very hard   ?Food Insecurity: No Food Insecurity  ? Worried About Charity fundraiser in the Last Year: Never true  ? Ran Out of Food in the Last Year: Never true  ?Transportation Needs: No Transportation Needs  ? Lack

## 2022-03-23 ENCOUNTER — Other Ambulatory Visit: Payer: Self-pay | Admitting: Internal Medicine

## 2022-03-23 DIAGNOSIS — R442 Other hallucinations: Secondary | ICD-10-CM

## 2022-03-23 LAB — CULTURE, BLOOD (ROUTINE X 2): Culture: NO GROWTH

## 2022-03-29 ENCOUNTER — Encounter (HOSPITAL_COMMUNITY): Payer: Self-pay

## 2022-03-29 ENCOUNTER — Emergency Department (HOSPITAL_COMMUNITY): Payer: Medicare Other

## 2022-03-29 ENCOUNTER — Encounter (HOSPITAL_BASED_OUTPATIENT_CLINIC_OR_DEPARTMENT_OTHER): Payer: Medicare Other | Attending: General Surgery | Admitting: General Surgery

## 2022-03-29 ENCOUNTER — Other Ambulatory Visit: Payer: Self-pay

## 2022-03-29 ENCOUNTER — Inpatient Hospital Stay (HOSPITAL_COMMUNITY): Payer: Medicare Other

## 2022-03-29 ENCOUNTER — Inpatient Hospital Stay (HOSPITAL_COMMUNITY)
Admission: EM | Admit: 2022-03-29 | Discharge: 2022-04-10 | DRG: 239 | Disposition: A | Discharge status: Skilled Nursing Facility | Payer: Medicare Other | Source: Ambulatory Visit | Attending: Family Medicine | Admitting: Family Medicine

## 2022-03-29 DIAGNOSIS — E1165 Type 2 diabetes mellitus with hyperglycemia: Secondary | ICD-10-CM | POA: Diagnosis present

## 2022-03-29 DIAGNOSIS — E875 Hyperkalemia: Secondary | ICD-10-CM | POA: Diagnosis not present

## 2022-03-29 DIAGNOSIS — L97529 Non-pressure chronic ulcer of other part of left foot with unspecified severity: Secondary | ICD-10-CM

## 2022-03-29 DIAGNOSIS — N186 End stage renal disease: Secondary | ICD-10-CM | POA: Diagnosis present

## 2022-03-29 DIAGNOSIS — E1169 Type 2 diabetes mellitus with other specified complication: Secondary | ICD-10-CM | POA: Diagnosis present

## 2022-03-29 DIAGNOSIS — Z993 Dependence on wheelchair: Secondary | ICD-10-CM

## 2022-03-29 DIAGNOSIS — E119 Type 2 diabetes mellitus without complications: Secondary | ICD-10-CM | POA: Diagnosis not present

## 2022-03-29 DIAGNOSIS — I10 Essential (primary) hypertension: Secondary | ICD-10-CM | POA: Diagnosis not present

## 2022-03-29 DIAGNOSIS — L97419 Non-pressure chronic ulcer of right heel and midfoot with unspecified severity: Secondary | ICD-10-CM | POA: Diagnosis present

## 2022-03-29 DIAGNOSIS — M86272 Subacute osteomyelitis, left ankle and foot: Secondary | ICD-10-CM | POA: Diagnosis not present

## 2022-03-29 DIAGNOSIS — R110 Nausea: Secondary | ICD-10-CM

## 2022-03-29 DIAGNOSIS — M869 Osteomyelitis, unspecified: Secondary | ICD-10-CM | POA: Diagnosis present

## 2022-03-29 DIAGNOSIS — Z8249 Family history of ischemic heart disease and other diseases of the circulatory system: Secondary | ICD-10-CM

## 2022-03-29 DIAGNOSIS — E785 Hyperlipidemia, unspecified: Secondary | ICD-10-CM | POA: Diagnosis present

## 2022-03-29 DIAGNOSIS — E1142 Type 2 diabetes mellitus with diabetic polyneuropathy: Secondary | ICD-10-CM | POA: Diagnosis present

## 2022-03-29 DIAGNOSIS — E1149 Type 2 diabetes mellitus with other diabetic neurological complication: Secondary | ICD-10-CM | POA: Diagnosis present

## 2022-03-29 DIAGNOSIS — I132 Hypertensive heart and chronic kidney disease with heart failure and with stage 5 chronic kidney disease, or end stage renal disease: Secondary | ICD-10-CM | POA: Diagnosis present

## 2022-03-29 DIAGNOSIS — D631 Anemia in chronic kidney disease: Secondary | ICD-10-CM | POA: Diagnosis present

## 2022-03-29 DIAGNOSIS — Z7982 Long term (current) use of aspirin: Secondary | ICD-10-CM

## 2022-03-29 DIAGNOSIS — Z794 Long term (current) use of insulin: Secondary | ICD-10-CM | POA: Diagnosis not present

## 2022-03-29 DIAGNOSIS — I739 Peripheral vascular disease, unspecified: Secondary | ICD-10-CM | POA: Diagnosis not present

## 2022-03-29 DIAGNOSIS — E871 Hypo-osmolality and hyponatremia: Secondary | ICD-10-CM

## 2022-03-29 DIAGNOSIS — Z833 Family history of diabetes mellitus: Secondary | ICD-10-CM

## 2022-03-29 DIAGNOSIS — Z800 Family history of malignant neoplasm of digestive organs: Secondary | ICD-10-CM

## 2022-03-29 DIAGNOSIS — D62 Acute posthemorrhagic anemia: Secondary | ICD-10-CM | POA: Diagnosis not present

## 2022-03-29 DIAGNOSIS — I2510 Atherosclerotic heart disease of native coronary artery without angina pectoris: Secondary | ICD-10-CM

## 2022-03-29 DIAGNOSIS — Z87891 Personal history of nicotine dependence: Secondary | ICD-10-CM | POA: Diagnosis not present

## 2022-03-29 DIAGNOSIS — Z66 Do not resuscitate: Secondary | ICD-10-CM | POA: Diagnosis present

## 2022-03-29 DIAGNOSIS — L03116 Cellulitis of left lower limb: Secondary | ICD-10-CM | POA: Diagnosis present

## 2022-03-29 DIAGNOSIS — R112 Nausea with vomiting, unspecified: Secondary | ICD-10-CM | POA: Diagnosis present

## 2022-03-29 DIAGNOSIS — Z83438 Family history of other disorder of lipoprotein metabolism and other lipidemia: Secondary | ICD-10-CM

## 2022-03-29 DIAGNOSIS — I5032 Chronic diastolic (congestive) heart failure: Secondary | ICD-10-CM

## 2022-03-29 DIAGNOSIS — Z20822 Contact with and (suspected) exposure to covid-19: Secondary | ICD-10-CM | POA: Diagnosis present

## 2022-03-29 DIAGNOSIS — K3184 Gastroparesis: Secondary | ICD-10-CM | POA: Diagnosis present

## 2022-03-29 DIAGNOSIS — M86271 Subacute osteomyelitis, right ankle and foot: Secondary | ICD-10-CM

## 2022-03-29 DIAGNOSIS — E876 Hypokalemia: Secondary | ICD-10-CM

## 2022-03-29 DIAGNOSIS — Z8349 Family history of other endocrine, nutritional and metabolic diseases: Secondary | ICD-10-CM

## 2022-03-29 DIAGNOSIS — E1122 Type 2 diabetes mellitus with diabetic chronic kidney disease: Secondary | ICD-10-CM | POA: Diagnosis present

## 2022-03-29 DIAGNOSIS — L97509 Non-pressure chronic ulcer of other part of unspecified foot with unspecified severity: Secondary | ICD-10-CM

## 2022-03-29 DIAGNOSIS — E1143 Type 2 diabetes mellitus with diabetic autonomic (poly)neuropathy: Secondary | ICD-10-CM | POA: Diagnosis present

## 2022-03-29 DIAGNOSIS — I96 Gangrene, not elsewhere classified: Secondary | ICD-10-CM | POA: Diagnosis not present

## 2022-03-29 DIAGNOSIS — Z992 Dependence on renal dialysis: Secondary | ICD-10-CM | POA: Diagnosis not present

## 2022-03-29 DIAGNOSIS — E11621 Type 2 diabetes mellitus with foot ulcer: Secondary | ICD-10-CM | POA: Diagnosis present

## 2022-03-29 DIAGNOSIS — K5900 Constipation, unspecified: Secondary | ICD-10-CM | POA: Diagnosis present

## 2022-03-29 DIAGNOSIS — Z803 Family history of malignant neoplasm of breast: Secondary | ICD-10-CM

## 2022-03-29 DIAGNOSIS — E1152 Type 2 diabetes mellitus with diabetic peripheral angiopathy with gangrene: Principal | ICD-10-CM | POA: Diagnosis present

## 2022-03-29 DIAGNOSIS — Z8673 Personal history of transient ischemic attack (TIA), and cerebral infarction without residual deficits: Secondary | ICD-10-CM

## 2022-03-29 DIAGNOSIS — R5381 Other malaise: Secondary | ICD-10-CM | POA: Diagnosis present

## 2022-03-29 DIAGNOSIS — D649 Anemia, unspecified: Secondary | ICD-10-CM

## 2022-03-29 DIAGNOSIS — Z90710 Acquired absence of both cervix and uterus: Secondary | ICD-10-CM

## 2022-03-29 DIAGNOSIS — Z6836 Body mass index (BMI) 36.0-36.9, adult: Secondary | ICD-10-CM

## 2022-03-29 DIAGNOSIS — Z635 Disruption of family by separation and divorce: Secondary | ICD-10-CM

## 2022-03-29 DIAGNOSIS — Z79899 Other long term (current) drug therapy: Secondary | ICD-10-CM

## 2022-03-29 DIAGNOSIS — R9431 Abnormal electrocardiogram [ECG] [EKG]: Secondary | ICD-10-CM

## 2022-03-29 DIAGNOSIS — I509 Heart failure, unspecified: Secondary | ICD-10-CM | POA: Diagnosis not present

## 2022-03-29 DIAGNOSIS — L97429 Non-pressure chronic ulcer of left heel and midfoot with unspecified severity: Secondary | ICD-10-CM | POA: Diagnosis present

## 2022-03-29 LAB — COMPREHENSIVE METABOLIC PANEL
ALT: 14 U/L (ref 0–44)
AST: 13 U/L — ABNORMAL LOW (ref 15–41)
Albumin: 2.1 g/dL — ABNORMAL LOW (ref 3.5–5.0)
Alkaline Phosphatase: 86 U/L (ref 38–126)
Anion gap: 13 (ref 5–15)
BUN: 17 mg/dL (ref 8–23)
CO2: 28 mmol/L (ref 22–32)
Calcium: 8.4 mg/dL — ABNORMAL LOW (ref 8.9–10.3)
Chloride: 87 mmol/L — ABNORMAL LOW (ref 98–111)
Creatinine, Ser: 3.95 mg/dL — ABNORMAL HIGH (ref 0.44–1.00)
GFR, Estimated: 12 mL/min — ABNORMAL LOW (ref >60)
Glucose, Bld: 409 mg/dL — ABNORMAL HIGH (ref 70–99)
Potassium: 2.8 mmol/L — ABNORMAL LOW (ref 3.5–5.1)
Sodium: 128 mmol/L — ABNORMAL LOW (ref 135–145)
Total Bilirubin: 0.3 mg/dL (ref 0.3–1.2)
Total Protein: 7.8 g/dL (ref 6.5–8.1)

## 2022-03-29 LAB — CBC WITH DIFFERENTIAL/PLATELET
Abs Immature Granulocytes: 0.55 10*3/uL — ABNORMAL HIGH (ref 0.00–0.07)
Basophils Absolute: 0.1 10*3/uL (ref 0.0–0.1)
Basophils Relative: 0 %
Eosinophils Absolute: 0 10*3/uL (ref 0.0–0.5)
Eosinophils Relative: 0 %
HCT: 25.2 % — ABNORMAL LOW (ref 36.0–46.0)
Hemoglobin: 7.5 g/dL — ABNORMAL LOW (ref 12.0–15.0)
Immature Granulocytes: 2 %
Lymphocytes Relative: 5 %
Lymphs Abs: 1.5 10*3/uL (ref 0.7–4.0)
MCH: 26.8 pg (ref 26.0–34.0)
MCHC: 29.8 g/dL — ABNORMAL LOW (ref 30.0–36.0)
MCV: 90 fL (ref 80.0–100.0)
Monocytes Absolute: 1.7 10*3/uL — ABNORMAL HIGH (ref 0.1–1.0)
Monocytes Relative: 6 %
Neutro Abs: 23.2 10*3/uL — ABNORMAL HIGH (ref 1.7–7.7)
Neutrophils Relative %: 87 %
Platelets: 422 10*3/uL — ABNORMAL HIGH (ref 150–400)
RBC: 2.8 MIL/uL — ABNORMAL LOW (ref 3.87–5.11)
RDW: 15.9 % — ABNORMAL HIGH (ref 11.5–15.5)
WBC: 27 10*3/uL — ABNORMAL HIGH (ref 4.0–10.5)
nRBC: 0 % (ref 0.0–0.2)

## 2022-03-29 LAB — PROTIME-INR
INR: 1.2 (ref 0.8–1.2)
Prothrombin Time: 15.1 seconds (ref 11.4–15.2)

## 2022-03-29 LAB — CBG MONITORING, ED: Glucose-Capillary: 346 mg/dL — ABNORMAL HIGH (ref 70–99)

## 2022-03-29 LAB — LACTIC ACID, PLASMA: Lactic Acid, Venous: 2.5 mmol/L (ref 0.5–1.9)

## 2022-03-29 MED ORDER — VANCOMYCIN HCL 2000 MG/400ML IV SOLN
2000.0000 mg | Freq: Once | INTRAVENOUS | Status: AC
Start: 2022-03-29 — End: 2022-03-30
  Administered 2022-03-29: 2000 mg via INTRAVENOUS
  Filled 2022-03-29: qty 400

## 2022-03-29 MED ORDER — PIPERACILLIN-TAZOBACTAM IN DEX 2-0.25 GM/50ML IV SOLN
2.2500 g | Freq: Four times a day (QID) | INTRAVENOUS | Status: DC
  Administered 2022-03-30 – 2022-03-31 (×4): 2.25 g via INTRAVENOUS
  Filled 2022-03-29 (×9): qty 50

## 2022-03-29 MED ORDER — ACETAMINOPHEN 325 MG PO TABS: 650.0000 mg | ORAL_TABLET | Freq: Four times a day (QID) | ORAL | Status: DC | PRN

## 2022-03-29 MED ORDER — POTASSIUM CHLORIDE 10 MEQ/100ML IV SOLN: 10.0000 meq | INTRAVENOUS | Status: DC

## 2022-03-29 MED ORDER — VANCOMYCIN VARIABLE DOSE PER UNSTABLE RENAL FUNCTION (PHARMACIST DOSING): Status: DC

## 2022-03-29 MED ORDER — PIPERACILLIN-TAZOBACTAM 3.375 G IVPB 30 MIN
3.3750 g | Freq: Once | INTRAVENOUS | Status: AC
  Administered 2022-03-29: 3.375 g via INTRAVENOUS
  Filled 2022-03-29: qty 50

## 2022-03-29 MED ORDER — OXYCODONE HCL 5 MG PO TABS
5.0000 mg | ORAL_TABLET | Freq: Three times a day (TID) | ORAL | Status: DC | PRN
  Administered 2022-03-29 – 2022-04-03 (×7): 5 mg via ORAL
  Filled 2022-03-29 (×8): qty 1

## 2022-03-29 MED ORDER — SCOPOLAMINE 1 MG/3DAYS TD PT72
1.0000 | MEDICATED_PATCH | TRANSDERMAL | Status: DC
  Administered 2022-03-29 – 2022-04-07 (×4): 1.5 mg via TRANSDERMAL
  Filled 2022-03-29 (×4): qty 1

## 2022-03-29 MED ORDER — INSULIN ASPART 100 UNIT/ML IJ SOLN
0.0000 [IU] | INTRAMUSCULAR | Status: DC
  Administered 2022-03-29: 4 [IU] via SUBCUTANEOUS
  Administered 2022-03-30: 1 [IU] via SUBCUTANEOUS
  Administered 2022-03-30: 5 [IU] via SUBCUTANEOUS
  Administered 2022-03-30 – 2022-03-31 (×4): 2 [IU] via SUBCUTANEOUS
  Administered 2022-03-31: 1 [IU] via SUBCUTANEOUS
  Administered 2022-03-31: 3 [IU] via SUBCUTANEOUS
  Administered 2022-04-01 – 2022-04-02 (×3): 1 [IU] via SUBCUTANEOUS
  Administered 2022-04-03 – 2022-04-04 (×2): 2 [IU] via SUBCUTANEOUS
  Administered 2022-04-05 (×2): 3 [IU] via SUBCUTANEOUS
  Administered 2022-04-05 (×2): 1 [IU] via SUBCUTANEOUS
  Administered 2022-04-05: 5 [IU] via SUBCUTANEOUS
  Administered 2022-04-06 – 2022-04-07 (×5): 1 [IU] via SUBCUTANEOUS

## 2022-03-29 MED ORDER — ACETAMINOPHEN 650 MG RE SUPP: 650.0000 mg | Freq: Four times a day (QID) | RECTAL | Status: DC | PRN

## 2022-03-29 MED ORDER — SCOPOLAMINE 1 MG/3DAYS TD PT72
1.0000 | MEDICATED_PATCH | Freq: Once | TRANSDERMAL | Status: DC
  Filled 2022-03-29: qty 1

## 2022-03-29 MED ORDER — SODIUM CHLORIDE 0.9 % IV BOLUS
250.0000 mL | Freq: Once | INTRAVENOUS | Status: AC
Start: 2022-03-29 — End: 2022-03-30
  Administered 2022-03-29: 250 mL via INTRAVENOUS

## 2022-03-29 MED ORDER — LORAZEPAM 2 MG/ML IJ SOLN
0.5000 mg | Freq: Four times a day (QID) | INTRAMUSCULAR | Status: AC | PRN
  Administered 2022-04-01: 0.5 mg via INTRAVENOUS
  Filled 2022-03-29: qty 1

## 2022-03-29 MED ORDER — POTASSIUM CHLORIDE CRYS ER 20 MEQ PO TBCR
40.0000 meq | EXTENDED_RELEASE_TABLET | Freq: Once | ORAL | Status: DC
Start: 2022-03-29 — End: 2022-03-29

## 2022-03-29 MED ORDER — INSULIN ASPART 100 UNIT/ML IJ SOLN: 0.0000 [IU] | INTRAMUSCULAR | Status: DC

## 2022-03-29 MED ORDER — SODIUM CHLORIDE 0.9 % IV BOLUS
500.0000 mL | Freq: Once | INTRAVENOUS | Status: DC
Start: 2022-03-29 — End: 2022-03-29

## 2022-03-29 NOTE — Assessment & Plan Note (Addendum)
Echo done October 2022 showing LVEF 50 to 55% and grade 2 diastolic dysfunction.  No significant volume overload. ?- Volume management per dialysis ?

## 2022-03-29 NOTE — ED Provider Triage Note (Signed)
Emergency Medicine Provider Triage Evaluation Note ? ?Nancy [name] , a 61 y.o. female  was evaluated in triage.  Pt complains of new left foot wound. She has a chronic wound and the sole of her left foot for the past 5-6 months. Noticed a new wound to the lateral aspect of the left foot last night. Went to wound care and they sent to ER for evaluation. ? ?Review of Systems  ?Positive:  ?Negative:  ? ?Physical Exam  ?BP (!) 152/132 (BP Location: Left Arm)   Pulse 91   Temp 97.8 ?F (36.6 ?C) (Oral)   Resp 18   Ht '5\' 3"'$  (1.6 m)   Wt 93.4 kg   SpO2 100%   BMI 36.48 kg/m?  ?Gen:   Awake, no distress   ?Resp:  Normal effort  ?MSK:   Moves extremities without difficulty  ?Other:  Oozing wound to the upper lateral aspect of the foot at the base of the fifth toe. New wound to the upper sole. Large chronic wound at the heel and lower sole. Dopplerable pulses.  ? ?Medical Decision Making  ?Medically screening exam initiated at 6:34 PM.  Appropriate orders placed.  MALETA PACHA was informed that the remainder of the evaluation will be completed by another provider, this initial triage assessment does not replace that evaluation, and the importance of remaining in the ED until their evaluation is complete. ? ?Labs and imaging placed.  ? ?  ?Sherrell Puller, PA-C ?03/29/22 1851 ? ?

## 2022-03-29 NOTE — Assessment & Plan Note (Addendum)
QT prolongation ?K normalized ?

## 2022-03-29 NOTE — Assessment & Plan Note (Addendum)
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 Sharol Given ? ?Completed 24 hours post-op Abx ?- PT eval ?- TOC consult ? ? ? ?

## 2022-03-29 NOTE — ED Notes (Signed)
LA - 2.5, Dr. Roslynn Amble informed. ?

## 2022-03-29 NOTE — Assessment & Plan Note (Addendum)
Presented with a glucose level greater than 400.  HbA1c 6.3 in March. ? ?Glucose normal now ?- Continue Glargine ?- Continue SS corrections ?- Continue mealtime insulin ?- Continue atorvastatin, Zetia, aspirin ?

## 2022-03-29 NOTE — Assessment & Plan Note (Addendum)
New start HD last month ?- Consult Nephrology for routine HD ?- Continue Phoslo ?

## 2022-03-29 NOTE — Progress Notes (Signed)
Pharmacy Antibiotic Note ? ?Nancy [name] is a 61 y.o. female admitted on 03/29/2022 with  osteomyelitis .  Pharmacy has been consulted for vancomycin and zosyn dosing. ? ?WBC elevated, SCr elevated. Patient is beng evaluated for HD but has not started yet.  ? ?Plan: ?-Zosyn 3.375 gm IV load followed by 2.25 IV Q 6 hours  ?-Vancomycin 2 gm IV load followed by dosing per levels. F/u renal plans for HD  ?-Monitor CBC, renal fx, cultures and clinical progress ? ?Height: '5\' 3"'$  (160 cm) ?Weight: 93.4 kg (205 lb 14.6 oz) ?IBW/kg (Calculated) : 52.4 ? ?Temp (24hrs), Avg:97.8 ?F (36.6 ?C), Min:97.8 ?F (36.6 ?C), Max:97.8 ?F (36.6 ?C) ? ?Recent Labs  ?Lab 03/29/22 ?1839  ?WBC 27.0*  ?CREATININE 3.95*  ?LATICACIDVEN 2.5*  ?  ?Estimated Creatinine Clearance: 16.2 mL/min (A) (by C-G formula based on SCr of 3.95 mg/dL (H)).   ? ?Allergies  ?Allergen Reactions  ? Hydralazine Hcl Other (See Comments)  ?  Hair loss  ? Hydrocodone Itching and Other (See Comments)  ?  Upset stomach  ? Metformin And Related Nausea And Vomiting and Other (See Comments)  ?  Stomach pains, also  ? Other Nausea Only and Other (See Comments)  ?  Lettuce- Does not digest this!!  ? Plaquenil [Hydroxychloroquine Sulfate] Hives  ? Shellfish-Derived Products Nausea Only and Other (See Comments)  ?  Caused an upset stomach  ? Shrimp (Diagnostic) Nausea Only and Other (See Comments)  ?  Upset stomach   ? Sulfa Antibiotics Hives  ? ? ?Antimicrobials this admission: ?Zosyn 4/13 >>  ?Vancomycin 4/13 >>  ? ?Dose adjustments this admission: ? ? ?Microbiology results: ?4/13 BCx:  ? ? ?Thank you for allowing pharmacy to be a part of this patient?s care. ? ?Albertina Parr, PharmD., BCCCP ?Clinical Pharmacist ?Please refer to AMION for unit-specific pharmacist  ? ? ?

## 2022-03-29 NOTE — Progress Notes (Addendum)
KORIE, STREAT ([id number]) ?Visit Report for 03/29/2022 ?Chief Complaint Document Details ?Patient Name: Date of Service: ?[name] 03/29/2022 12:30 PM ?Medical Record Number: [id number] ?Patient Account Number: [id number] ?Date of Birth/Sex: Treating RN: ?January 23, 1961 (61 y.o. F) ?Primary Care Provider: [name] Other Clinician: ?Referring Provider: ?Treating Provider/Extender: Fredirick Maudlin ?[name] ?Weeks in Treatment: 25 ?Information Obtained from: Patient ?Chief Complaint ?10/04/2021; patient is here for review of wounds on her left foot x2 and left heel ?Electronic Signature(s) ?Signed: 03/29/2022 2:14:17 PM By: Fredirick Maudlin MD FACS ?Entered By: Fredirick Maudlin on 03/29/2022 14:14:17 ?-------------------------------------------------------------------------------- ?HPI Details ?Patient Name: Date of Service: ?[name] 03/29/2022 12:30 PM ?Medical Record Number: [id number] ?Patient Account Number: [id number] ?Date of Birth/Sex: Treating RN: ?1961-01-13 (61 y.o. F) ?Primary Care Provider: [name] Other Clinician: ?Referring Provider: ?Treating Provider/Extender: Fredirick Maudlin ?[name] ?Weeks in Treatment: 25 ?History of Present Illness ?HPI Description: ADMISSION ?10/04/2021 ?This is a 61 year old woman with multiple severe medical issues who arrives for review of wounds on her left foot. Recent medical history in epic shows a ?cellulitis of these left foot with gangrenous change and admission to hospital from 9/19 through 10/6. An MRI of the foot surprisingly did not show ?osteomyelitis. In the hospital she was treated with IV antibiotics and then discharged on Augmentin and doxycycline she has completed this. She also had ?noninvasive arterial studies that showed an ABI in the right of 0.45 on the left 0.46 monophasic waveforms bilaterally. They did not do TBI's. The patient was ?seen by Dr. Trula Slade in consult in the hospital. He felt  she had evidence of arterial occlusive disease with an ABI on the left of 0.5 with monophasic ?waveforms at the ankle. Noted that any attempt at using dye in this patient would potentially push her into acute renal failure and with the patient who has been ?refusing dialysis this would be a fatal event. The patient saw palliative care but she was not interested in pursuing pure palliative care or SNF placement she ?has Medicaid pending but she does have home health ?The patient also was seen by nephrology in the hospital. She told them she did not want dialysis I think because her mother was on dialysis at 1 point. They did ?recommend a AV fistula. Again the patient refused. They noted deterioration from stage IV to stage V chronic renal failure but they did not feel there was an ?urgent need for dialysis at that moment. ?The patient has a large boggy ischemic wound on most of her left heel. On the left lateral foot there is an open area with covering dry gangrene. On the dorsal ?foot just distal to the ankle there is a better looking wound surface that may have been a wrap injury at 1 point. The patient says that her heel and lateral foot ?have been there for about 2 months and the dorsal foot is uncertain. She has minimal tolerance with walking because of pain in her legs likely claudication. ?Past medical history includes type 2 diabetes with peripheral neuropathy on insulin, diastolically mediated congestive heart failure, hyperlipidemia, ?hypertension. Her discharge creatinine in the hospital was 2.75 with an estimated GFR of 19. It was much higher than this on presentation. Both vascular and ?Ortho recommended an amputation which the patient refused ?11/3; since the patient was last here she was hospitalized for 5 days from 10/05/2021 through 10/10/2021 this was because of acute on chronic congestive ?heart failure. Post diuresis she  was felt to have chronic kidney disease stage IV. Nephrology was consulted  during the original admission and felt there was no ?indication for starting dialysis while an inpatient. Apparently has a follow-up with nephrologist Dr. Hollie Salk in 2 or 3 months. She did not see a vascular during this ?hospitalization although she previously saw Dr. Trula Slade at a point in time where she was apparently refusing dialysis although I have said previously I do not ?think she really understood what she was being told ?We have been using Betadine on the necrotic wounds on the heel and the lateral foot and silver collagen on the dorsal foot wound on the left she does not ?have any wounds on the right. She comes in today with a necrotic eschar on the heel and the lateral foot separating visibly. ?12/1; since the patient was last here almost a month ago she was hospitalized from 10/24/2021 through 11/01/2021. I am not sure at this point how she presented ?however she was felt to have left foot osteomyelitis in her heel based on a plain x-ray. They correctly pointed out that her MRI was contraindicated but did not ?do anything beyond this. Orthopedic surgeons recommended an amputation which I am not sure that the patient agreed to. Also saw infectious disease who ?confirmed that "antibiotic treatment is not curative" she also had acute on chronic diastolic heart failure and now they are saying her chronic kidney disease ?stage IV is actually improved. I am not sure why they would not have addressed her vascular issues in the hospital. ?I looked back on her original arterial studies in September. These showed monophasic waveforms at the PTA and dorsalis pedis on the left with a ABI of 0.50. ?They are still using Dakin's wet-to-dry. ?12/15Since the patient was last here I had a conversation with Dr. Gwenlyn Found. He is going to go ahead with arterial Dopplers on the left leg to see if he can ?determine the level of obstruction accounting for the ABI of 0.5 during her initial hospitalization. I feel that if there is some  target that we can look at it and the ?risk is acceptable to go ahead and do angiography on her. She understands that this very well could throw her into dialysis dependent chronic renal failure. I ?previously discussed this at length with her husband and her ?2/2; almost a 6-week hiatus since we last saw this patient. Since last time she was here she was hospitalized from 12/22/2021 through 12/24/2021. At the time she ?was having a right arm AV shunt placed. She developed post operative respiratory distress requiring intubation she was transferred to the ICU. Indicates she ?now has a functioning shunt in the right arm but not ready for use. Her discharge summary written noted PAD with bilateral gangrenous pressure ulcers left ?greater than right. She did not to my knowledge have wounds on the right leg but she was dealing with necrotic wounds on the left heel and the left lateral foot. ?She refused a BKA. Her husband is now using Dakin's wet-to-dry. ?She arrives in clinic today with the area on the left lateral foot healed she still has a substantial but generally better looking area on the plantar left heel ?She had recent noninvasive arterial studies on the left. These were on 01/06/2022. showing a 30 to 49% stenosis in the common femoral artery is 75 to 99% ?stenosis in the distal femoral artery and a 30- 49% stenosis in the popliteal artery. ABI in the left is 0.97 at the dorsalis  pedis pulse with although all of her ?waveforms were monophasic. She had more significant findings on the right however she does not have an open wound here. ?Her husband's been using Dakin's wet-to-dry daily. The heel wound surface actually look quite good however she had thick cave then areas of skin and ?subcutaneous tissue around the margins ?She is back in clinic today. ?2/16; this is a patient who I saw for the first time in 6 weeks 2 weeks ago. She had been in the hospital after going into respiratory failure at the time  of ?placement of her AV shunt. Surprisingly when I saw her 2 weeks ago the wound actually looked reasonably healthy with decent looking granulation. Her ?husband was using wet-to-dry with Dakin's. Our intake nurses

## 2022-03-29 NOTE — Assessment & Plan Note (Addendum)
-   Continue aspirin, atorvastatin, Zetia, metoprolol ?

## 2022-03-29 NOTE — Progress Notes (Signed)
Nancy [name] ([id number]) ?Visit Report for 03/29/2022 ?Arrival Information Details ?Patient Name: Date of Service: ?Nancy [name] 03/29/2022 12:30 PM ?Medical Record Number: [id number] ?Patient Account Number: [id number] ?Date of Birth/Sex: Treating RN: ?[birth year] (61 y.o. F) [name], Nancy Claude ?Primary Care Cheron Pasquarelli: [name] Other Clinician: ?Referring Dennies Coate: ?Treating Latrece Nitta/Extender: Fredirick Maudlin ?[name] ?Weeks in Treatment: 25 ?Visit Information History Since Last Visit ?Added or deleted any medications: No ?Patient Arrived: Wheel Chair ?Any new allergies or adverse reactions: No ?Arrival Time: 13:00 ?Had a fall or experienced change in No ?Accompanied By: SPOUSE ?activities of daily living that may affect ?Transfer Assistance: Manual ?risk of falls: ?Patient Identification Verified: Yes ?Signs or symptoms of abuse/neglect since last visito No ?Patient Requires Transmission-Based No ?Hospitalized since last visit: No ?Precautions: ?Implantable device outside of the clinic excluding No ?Patient Has Alerts: Yes ?cellular tissue based products placed in the center ?Patient Alerts: ?ABI's: 09/22 R:0.45 L0.5 ?since last visit: ?ABIs 01/05/22 R=.63, ?Has Dressing in Place as Prescribed: Yes ?L=.97 ?Pain Present Now: No ?Notes ?Tried to use the [place] but the spouse did not want that. ?Electronic Signature(s) ?Signed: 03/29/2022 3:00:09 PM By: Dellie Catholic RN ?Entered By: Dellie Catholic on 03/29/2022 13:08:45 ?-------------------------------------------------------------------------------- ?Clinic Level of Care Assessment Details ?Patient Name: Date of Service: ?Nancy [name] 03/29/2022 12:30 PM ?Medical Record Number: [id number] ?Patient Account Number: [id number] ?Date of Birth/Sex: Treating RN: ?[birth year] (61 y.o. F) [name], Nancy Claude ?Primary Care Haleigh Desmith: [name] Other Clinician: ?Referring Whitnie Deleon: ?Treating Kaelynne Christley/Extender: Fredirick Maudlin ?[name] ?Weeks in Treatment: 25 ?Clinic Level of Care Assessment Items ?TOOL 4 Quantity Score ?X- 1 0 ?Use when only an EandM is performed on FOLLOW-UP visit ?ASSESSMENTS - Nursing Assessment / Reassessment ?X- 1 10 ?Reassessment of Co-morbidities (includes updates in patient status) ?X- 1 5 ?Reassessment of Adherence to Treatment Plan ?ASSESSMENTS - Wound and Skin A ssessment / Reassessment ?'[]'$  - 0 ?Simple Wound Assessment / Reassessment - one wound ?X- 4 5 ?Complex Wound Assessment / Reassessment - multiple wounds ?X- 1 10 ?Dermatologic / Skin Assessment (not related to wound area) ?ASSESSMENTS - Focused Assessment ?'[]'$  - 0 ?Circumferential Edema Measurements - multi extremities ?'[]'$  - 0 ?Nutritional Assessment / Counseling / Intervention ?'[]'$  - 0 ?Lower Extremity Assessment (monofilament, tuning fork, pulses) ?'[]'$  - 0 ?Peripheral Arterial Disease Assessment (using hand held doppler) ?ASSESSMENTS - Ostomy and/or Continence Assessment and Care ?'[]'$  - 0 ?Incontinence Assessment and Management ?'[]'$  - 0 ?Ostomy Care Assessment and Management (repouching, etc.) ?PROCESS - Coordination of Care ?'[]'$  - 0 ?Simple Patient / Family Education for ongoing care ?X- 1 20 ?Complex (extensive) Patient / Family Education for ongoing care ?X- 1 10 ?Staff obtains Consents, Records, T Results / Process Orders ?est ?X- 1 10 ?Staff telephones HHA, Nursing Homes / Clarify orders / etc ?'[]'$  - 0 ?Routine Transfer to another Facility (non-emergent condition) ?'[]'$  - 0 ?Routine Hospital Admission (non-emergent condition) ?'[]'$  - 0 ?New Admissions / Biomedical engineer / Ordering NPWT Apligraf, etc. ?, ?X- 1 20 ?Emergency Hospital Admission (emergent condition) ?'[]'$  - 0 ?Simple Discharge Coordination ?X- 1 15 ?Complex (extensive) Discharge Coordination ?PROCESS - Special Needs ?'[]'$  - 0 ?Pediatric / Minor Patient Management ?'[]'$  - 0 ?Isolation Patient Management ?'[]'$  - 0 ?Hearing / Language / Visual special needs ?'[]'$  - 0 ?Assessment of Community  assistance (transportation, D/C planning, etc.) ?'[]'$  - 0 ?Additional assistance / Altered mentation ?'[]'$  - 0 ?Support Surface(s) Assessment (bed, cushion, seat, etc.) ?INTERVENTIONS - Wound  Cleansing / Measurement ?'[]'$  - 0 ?Simple Wound Cleansing - one wound ?X- 4 5 ?Complex Wound Cleansing - multiple wounds ?X- 1 5 ?Wound Imaging (photographs - any number of wounds) ?'[]'$  - 0 ?Wound Tracing (instead of photographs) ?'[]'$  - 0 ?Simple Wound Measurement - one wound ?X- 4 5 ?Complex Wound Measurement - multiple wounds ?INTERVENTIONS - Wound Dressings ?'[]'$  - 0 ?Small Wound Dressing one or multiple wounds ?'[]'$  - 0 ?Medium Wound Dressing one or multiple wounds ?X- 4 20 ?Large Wound Dressing one or multiple wounds ?'[]'$  - 0 ?Application of Medications - topical ?'[]'$  - 0 ?Application of Medications - injection ?INTERVENTIONS - Miscellaneous ?'[]'$  - 0 ?External ear exam ?'[]'$  - 0 ?Specimen Collection (cultures, biopsies, blood, body fluids, etc.) ?'[]'$  - 0 ?Specimen(s) / Culture(s) sent or taken to Lab for analysis ?'[]'$  - 0 ?Patient Transfer (multiple staff / Civil Service fast streamer / Similar devices) ?'[]'$  - 0 ?Simple Staple / Suture removal (25 or less) ?'[]'$  - 0 ?Complex Staple / Suture removal (26 or more) ?'[]'$  - 0 ?Hypo / Hyperglycemic Management (close monitor of Blood Glucose) ?'[]'$  - 0 ?Ankle / Brachial Index (ABI) - do not check if billed separately ?X- 1 5 ?Vital Signs ?Has the patient been seen at the hospital within the last three years: Yes ?Total Score: 250 ?Level Of Care: New/Established - Level 5 ?Electronic Signature(s) ?Signed: 03/29/2022 3:00:09 PM By: Dellie Catholic RN ?Entered By: Dellie Catholic on 03/29/2022 14:32:09 ?-------------------------------------------------------------------------------- ?Encounter Discharge Information Details ?Patient Name: Date of Service: ?Nancy [name] 03/29/2022 12:30 PM ?Medical Record Number: [id number] ?Patient Account Number: [id number] ?Date of Birth/Sex: Treating RN: ?[birth year] (61 y.o. F)  [name], Nancy Claude ?Primary Care Tavien Chestnut: [name] Other Clinician: ?Referring Roniyah Llorens: ?Treating Ladrea Holladay/Extender: Fredirick Maudlin ?[name] ?Weeks in Treatment: 25 ?Encounter Discharge Information Items ?Discharge Condition: Stable ?Ambulatory Status: Wheelchair ?Discharge Destination: Emergency Room ?Telephoned: No ?Orders Sent: Yes ?Transportation: Private Auto ?Accompanied By: spouse ?Schedule Follow-up Appointment: Yes ?Clinical Summary of Care: Patient Declined ?Notes ?Spouse stated that he would transport his spouse (the patient) to [facility] ?Electronic Signature(s) ?Signed: 03/29/2022 3:00:09 PM By: Dellie Catholic RN ?Entered By: Dellie Catholic on 03/29/2022 14:59:45 ?-------------------------------------------------------------------------------- ?Lower Extremity Assessment Details ?Patient Name: Date of Service: ?Nancy [name] 03/29/2022 12:30 PM ?Medical Record Number: [id number] ?Patient Account Number: [id number] ?Date of Birth/Sex: Treating RN: ?1961-05-28 (61 y.o. F) [name], Nancy Claude ?Primary Care Kattaleya Alia: [name] Other Clinician: ?Referring Valeree Leidy: ?Treating Cherell Colvin/Extender: Fredirick Maudlin ?[name] ?Weeks in Treatment: 25 ?Edema Assessment ?Assessed: [Left: No] [Right: No] ?Edema: [Left: Ye] [Right: s] ?Calf ?Left: Right: ?Point of Measurement: 33 cm From Medial Instep 48.5 cm ?Ankle ?Left: Right: ?Point of Measurement: 10 cm From Medial Instep 31.2 cm ?Knee To Floor ?Left: Right: ?From Medial Instep 47 cm ?Electronic Signature(s) ?Signed: 03/29/2022 3:00:09 PM By: Dellie Catholic RN ?Entered By: Dellie Catholic on 03/29/2022 13:10:02 ?-------------------------------------------------------------------------------- ?Multi Wound Chart Details ?Patient Name: ?Date of Service: ?[place]. 03/29/2022 12:30 PM ?Medical Record Number: [id number] ?Patient Account Number: [id number] ?Date of Birth/Sex: ?Treating RN: ?1961/01/05  (61 y.o. F) ?Primary Care Ange Puskas: [name] ?Other Clinician: ?Referring Marc Leichter: ?Treating Laekyn Rayos/Extender: Fredirick Maudlin ?[name] ?Weeks in Treatment: 25 ?Vital Signs ?Height(in): 63 ?

## 2022-03-29 NOTE — ED Triage Notes (Signed)
Pt sent from wound clinic for infected wound on left foot. Wound on posterior of left foot is 2cmx1.7cm and has small amount of serosanguous fluid drainage and has some tissue hanging out of it. Wound on anterior of left foot of the 5th toe is 1.8cmx2.7cm. Pt has samll amount of serosanguous drainage coming from wound. Those are the two wounds the husband is concerned are infected. Pt also has a wound on the posterior of the left foot on the heel of the foot 4.7x5.3cm and the bedding is green escar. ?

## 2022-03-29 NOTE — Assessment & Plan Note (Addendum)
Hgb had been around 8-10 g/dL, was 7.5 g/dL on admission, trasnfused 2 units PRBCs on 4/14, 2 more units post-op on 4/21 ?- Continue aranesp, IV iron per Nephrology ?- Ttransfusion threshold 7 g/dL  ?

## 2022-03-29 NOTE — ED Provider Notes (Signed)
?[facility] ?Provider Note ? ? ?CSN: [id number] ?Arrival date & time: 03/29/22  1542 ? ?  ? ?History ? ?Chief Complaint  ?Patient presents with  ? Wound Infection  ? ? ?Nancy [name] is a 61 y.o. female. ? ?Patient with history of T2DM, CKD presents today with complaints of left foot wound. She states that she has been seeing wound care for a left heel wound for some time now, once admitted for same last September and given IV antibiotics and discharged.  At that time she did not have any evidence of osteomyelitis on MRI.  She was subsequently admitted again in November with x-rays revealing signs of osteomyelitis, it appears that they recommended amputation at this time which the patient refused.  She has been subsequently going to monthly wound care visits for management of this wound.  States that yesterday she noticed development of new wound to the top of her foot which is draining yellow fluid.  States that she then went to wound care for management of same today and they sent her here for further evaluation and management of this wound. ? ? ? ?The history is provided by the patient. No language interpreter was used.  ? ?  ? ?Home Medications ?Prior to Admission medications   ?Medication Sig Start Date End Date Taking? Authorizing Provider  ?acetaminophen (TYLENOL) 500 MG tablet Take 500 mg by mouth every 6 (six) hours as needed for moderate pain or headache.    [provider]  ?amLODipine (NORVASC) 10 MG tablet Take 1 tablet (10 mg total) by mouth daily. 11/07/21   Larey Dresser, MD  ?aspirin 81 MG EC tablet Take 1 tablet (81 mg total) by mouth daily. 09/26/21   Rafael Bihari, FNP  ?atorvastatin (LIPITOR) 40 MG tablet Take 1 tablet (40 mg total) by mouth daily. 11/07/21   Larey Dresser, MD  ?Blood Glucose Monitoring Suppl (ONETOUCH VERIO) w/Device KIT Check blood sugar three times daily. E11.40 05/06/20   [name], MD  ?calcium acetate  (PHOSLO) 667 MG capsule TAKE 1 CAPSULE (667 MG TOTAL) BY MOUTH 3 (THREE) TIMES DAILY WITH MEALS. 02/08/22   [name], MD  ?carvedilol (COREG) 12.5 MG tablet Take 1.5 tablets (18.75 mg total) by mouth 2 (two) times daily with a meal. 11/07/21   Larey Dresser, MD  ?Continuous Blood Gluc Receiver (DEXCOM G6 RECEIVER) DEVI 1 Device by Does not apply route daily. 11/15/21   [name], MD  ?Continuous Blood Gluc Sensor (DEXCOM G6 SENSOR) MISC 1 packet by Does not apply route daily. 11/15/21   [name], MD  ?Continuous Blood Gluc Transmit (DEXCOM G6 TRANSMITTER) MISC 1 packet by Does not apply route daily. 11/15/21   [name], MD  ?ferrous sulfate 325 (65 FE) MG tablet Take 1 tablet (325 mg total) by mouth daily with breakfast. 11/07/21   [name], MD  ?glucose blood test strip Use as instructedCheck blood sugar three times daily. E11.40 07/11/20   [name], MD  ?insulin glargine (LANTUS) 100 UNIT/ML Solostar Pen Inject 15 Units into the skin at bedtime. 02/21/22   Darliss Cheney, MD  ?insulin lispro (HUMALOG KWIKPEN) 100 UNIT/ML KwikPen Inject 4 units before breakfast and dinner. 11/07/21   [name], MD  ?Insulin Pen Needle 32G X 4 MM MISC Use to inject insulin as directed. 11/07/21   [name], MD  ?risperiDONE (RISPERDAL) 3 MG tablet TAKE 1  TABLET (3 MG TOTAL) BY MOUTH AT BEDTIME. 03/25/22 03/25/23  [name], MD  ?Insulin NPH Isophane & Regular (RELION 70/30 [place]) Inject 35 Units into the skin 2 (two) times daily.  08/17/14  [provider]  ?   ? ?Allergies    ?Hydralazine hcl, Hydrocodone, Metformin and related, Other, Plaquenil [hydroxychloroquine sulfate], Shellfish-derived products, Shrimp (diagnostic), and Sulfa antibiotics   ? ?Review of Systems   ?Review of Systems  ?Constitutional:  Negative for chills and fever.  ?Musculoskeletal:  Positive for arthralgias and myalgias.  ?Skin:  Positive for wound.  ?All other systems  reviewed and are negative. ? ?Physical Exam ?Updated Vital Signs ?BP (!) 152/132 (BP Location: Left Arm)   Pulse 91   Temp 97.8 ?F (36.6 ?C) (Oral)   Resp 18   Ht _0  (1.6 m)   Wt 93.4 kg   SpO2 100%   BMI 36.48 kg/m?  ?Physical Exam ?Vitals and nursing note reviewed.  ?Constitutional:   ?   General: She is not in acute distress. ?   Appearance: Normal appearance. She is normal weight. She is not ill-appearing, toxic-appearing or diaphoretic.  ?HENT:  ?   Head: Normocephalic and atraumatic.  ?Cardiovascular:  ?   Rate and Rhythm: Normal rate.  ?Pulmonary:  ?   Effort: Pulmonary effort is normal. No respiratory distress.  ?Musculoskeletal:     ?   General: Normal range of motion.  ?   Cervical back: Normal range of motion.  ?   Comments: Necrotic gangrenous left heel wound actively draining purulence with calcaneous visualized. Additional wound noted to the top of the left foot also draining purulence. Pulses palpated with doppler. See images below for further  ?Skin: ?   General: Skin is warm and dry.  ?Neurological:  ?   General: No focal deficit present.  ?   Mental Status: She is alert.  ?Psychiatric:     ?   Mood and Affect: Mood normal.     ?   Behavior: Behavior normal.  ? ? ? ? ? ?ED Results / Procedures / Treatments   ?Labs ?(all labs ordered are listed, but only abnormal results are displayed) ?Labs Reviewed  ?COMPREHENSIVE METABOLIC PANEL - Abnormal; Notable for the following components:  ?    Result Value  ? Sodium 128 (*)   ? Potassium 2.8 (*)   ? Chloride 87 (*)   ? Glucose, Bld 409 (*)   ? Creatinine, Ser 3.95 (*)   ? Calcium 8.4 (*)   ? Albumin 2.1 (*)   ? AST 13 (*)   ? GFR, Estimated 12 (*)   ? All other components within normal limits  ?LACTIC ACID, PLASMA - Abnormal; Notable for the following components:  ? Lactic Acid, Venous 2.5 (*)   ? All other components within normal limits  ?CBC WITH DIFFERENTIAL/PLATELET - Abnormal; Notable for the following components:  ? WBC 27.0 (*)   ? RBC  2.80 (*)   ? Hemoglobin 7.5 (*)   ? HCT 25.2 (*)   ? MCHC 29.8 (*)   ? RDW 15.9 (*)   ? Platelets 422 (*)   ? Neutro Abs 23.2 (*)   ? Monocytes Absolute 1.7 (*)   ? Abs Immature Granulocytes 0.55 (*)   ? All other components within normal limits  ?CULTURE, BLOOD (ROUTINE X 2)  ?CULTURE, BLOOD (ROUTINE X 2)  ?PROTIME-INR  ?LACTIC ACID, PLASMA  ?URINALYSIS, ROUTINE W REFLEX MICROSCOPIC  ? ? ?EKG ?None ? ?  Radiology ?DG Foot Complete Left ? ?Result Date: 03/29/2022 ?CLINICAL DATA:  Possible osteomyelitis EXAM: LEFT FOOT - COMPLETE 3+ VIEW COMPARISON:  10/27/2021, 02/01/2022 FINDINGS: No acute fracture or dislocation is noted. Considerable soft tissue air is noted about the third through fifth MTP joints and extending along the dorsal and inferior aspects of the foot towards the tarsal bones. Large soft tissue wound over the calcaneus is noted as well. There is again some erosive changes along the calcaneus increased in the interval from the prior exam inferiorly. No definitive bony erosive changes are noted in the distal foot. IMPRESSION: Changes consistent with extensive soft tissue infection with findings of osteomyelitis progressed in the calcaneus. No distal erosive changes are seen. Electronically Signed   By: Inez Catalina M.D.   On: 03/29/2022 19:36   ? ?Procedures ?Procedures  ? ? ?Medications Ordered in ED ?Medications - No data to display ? ?ED Course/ Medical Decision Making/ A&P ?  ?                        ?Medical Decision Making ?Amount and/or Complexity of Data Reviewed ?Labs: ordered. ?Radiology: ordered. ? ? ?This patient presents to the ED for concern of diabetic foot wound, this involves an extensive number of treatment options, and is a complaint that carries with it a high risk of complications and morbidity.  The differential diagnosis includes sepsis, osteomyelitis, skin infection ? ? ?Co morbidities that complicate the patient evaluation ? ?diabetes ? ? ?Additional history obtained: ? ?Additional  history obtained from wound care notes and previous admission notes  ? ? ?Lab Tests: ? ?I Ordered, and personally interpreted labs.  The pertinent results include:  K 2.8, Na 128, glucose 409, bicarb 28. Creatinine 3.

## 2022-03-29 NOTE — ED Notes (Signed)
Called multiple times and unable to locate pt. ?

## 2022-03-29 NOTE — Assessment & Plan Note (Addendum)
Likely due to constipation.  Abdominal film was unremarkable.  Abdomen is benign on examination.  Diabetic gastroparesis is also possibility.  Continue to monitor for now.  Scopolamine patch was ordered.   ?No further episodes of nausea and vomiting noted. ?

## 2022-03-29 NOTE — ED Notes (Signed)
IV team at bedside 

## 2022-03-29 NOTE — H&P (Signed)
?History and Physical  ? ? ?Nancy [name] EVX:[id number] DOB: 07-27-1961 DOA: 03/29/2022 ? ?PCP: Marcine Matar, MD ? ?Patient coming from: Home ? ?Chief Complaint: Left foot wound ? ?HPI: Nancy [name] is a 61 y.o. 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  Patient has chronic wounds of her left foot and heel for which she has previously refused amputation sent to the ED by wound care as her wounds appeared infected.  She was admitted in September last year and given antibiotics and discharged.  At that time, MRI did not show evidence of osteomyelitis.  ABIs revealed moderate bilateral lower extremity arterial disease.  Amputation was recommended but patient refused.  Admitted again in November with x-rays showing signs of osteomyelitis, amputation was again recommended but patient refused. ? ?Not febrile, tachycardic, or hypotensive.  WBC 27.0, hemoglobin 7.5, platelet count 422k.  Sodium 128, potassium 2.8, chloride 87, bicarb 28, BUN 17, creatinine 3.9, glucose 409.  Calcium 8.4, albumin 2.1.  Lactic acid 2.5.  Blood cultures drawn.  X-ray of left foot showing extensive soft tissue infection with findings of osteomyelitis progressed in the calcaneus. Patient was given vancomycin and Zosyn. ? ?Patient is a poor historian.  Reports chronic wounds of her left foot.  Reports new wound to the top of her foot which is draining pus.  Denies fevers.  Reports a lot of pain in her left foot.  Also reports vomiting for the past 2 days and not eating.  States her last bowel movement was 4 days ago.  Denies abdominal pain.  No additional history could be obtained from her. ? ?Review of Systems:  ?Review of Systems  ?All other systems reviewed and are negative. ? ?Past Medical History:  ?Diagnosis Date  ? Anemia 2006  ? Carotid artery  disease (HCC)   ? right ICA occlusion, 40-59% LICA 02/2021 [place]  ? CHF (congestive heart failure) (HCC)   ? Chronic kidney disease   ? Coronary artery disease   ? Depression 2014  ? previously on amitryptiline   ? Diabetes mellitus with neurological manifestation (HCC) 2006  ? Diabetic peripheral neuropathy (HCC) 04/26/2020  ? Dyspnea   ? Fracture of left ankle 1997  ? Gastroparesis 07/2016  ? Heart murmur   ? HOH (hard of hearing) 2004  ? Hyperlipidemia 2006  ? Hypertension 2006  ? IBS (irritable bowel syndrome) 2002  ? Leukopenia 2015  ? Macular degeneration 11/2019  ? Shingles 2009  ? Stroke [facility]) 2020  ? Thyroid nodule 2004  ? ? ?Past Surgical History:  ?Procedure Laterality Date  ? ABDOMINAL HYSTERECTOMY  2005  ? AV FISTULA PLACEMENT Right 12/22/2021  ? Procedure: RIGHT ARM ARTERIOVENOUS (AV) BRACIOCPHELAIC FISTULA CREATION;  Surgeon: Maeola Harman, MD;  Location[facility] OR;  Service: Vascular;  Laterality: Right;  ? AV FISTULA PLACEMENT Right 02/19/2022  ? Procedure: RIGHT ARTERIOVENOUS FISTULA;  Surgeon: Chuck Hint, MD;  Location: [facility] OR;  Service: Vascular;  Laterality: Right;  ? CATARACT EXTRACTION Left 11/2019  ? CESAREAN SECTION  1983   ? CHOLECYSTECTOMY N/A 03/05/2020  ? Procedure: LAPAROSCOPIC CHOLECYSTECTOMY WITH INTRAOPERATIVE CHOLANGIOGRAM;  Surgeon: Manus Rudd, MD;  Location: [facility] ORS;  Service: General;  Laterality: N/A;  ? ESOPHAGOGASTRODUODENOSCOPY (EGD) WITH PROPOFOL Left 08/26/2014  ? Procedure: ESOPHAGOGASTRODUODENOSCOPY (EGD) WITH PROPOFOL;  Surgeon: Willis Modena, MD;  Location: [facility] ENDOSCOPY;  Service: Endoscopy;  Laterality: Left;  ? ESOPHAGOGASTRODUODENOSCOPY (EGD) WITH PROPOFOL N/A 03/03/2020  ? Procedure: ESOPHAGOGASTRODUODENOSCOPY (EGD) WITH PROPOFOL;  Surgeon: Jerene Bears, MD;  Location: [facility] ENDOSCOPY;  Service: Gastroenterology;  Laterality: N/A;  ? IR FLUORO GUIDE CV LINE RIGHT  02/15/2022  ? IR US GUIDE VASC ACCESS RIGHT  02/15/2022  ? RIGHT HEART CATH N/A 07/14/2021  ?  Procedure: RIGHT HEART CATH;  Surgeon: Larey Dresser, MD;  Location: [place] CV LAB;  Service: Cardiovascular;  Laterality: N/A;  ? RIGHT/LEFT HEART CATH AND CORONARY ANGIOGRAPHY N/A 04/27/2021  ? Procedure: RIGHT/LEFT HEART CATH AND CORONARY ANGIOGRAPHY;  Surgeon: [name], MD;  Location: [place] [facility];  Service: Cardiovascular;  Laterality: N/A;  ? ? ? reports that she has quit smoking. Her smoking use included cigarettes. She has a 0.13 pack-year smoking history. She has never used smokeless tobacco. She reports that she does not drink alcohol and does not use drugs. ? ?Allergies  ?Allergen Reactions  ? Hydralazine Hcl Other (See Comments)  ?  Hair loss  ? Hydrocodone Itching and Other (See Comments)  ?  Upset stomach  ? Metformin And Related Nausea And Vomiting and Other (See Comments)  ?  Stomach pains, also  ? Other Nausea Only and Other (See Comments)  ?  Lettuce- Does not digest this!!  ? Plaquenil [Hydroxychloroquine Sulfate] Hives  ? Shellfish-Derived Products Nausea Only and Other (See Comments)  ?  Caused an upset stomach  ? Shrimp (Diagnostic) Nausea Only and Other (See Comments)  ?  Upset stomach   ? Sulfa Antibiotics Hives  ? ? ?Family History  ?Problem Relation Age of Onset  ? Hypertension Mother   ? Heart disease Mother   ? Diabetes Mother   ? Thyroid disease Mother   ? Congestive Heart Failure Mother   ? Breast cancer Maternal Grandmother   ? Colon cancer Maternal Grandfather   ? Heart attack Sister   ? Heart disease Brother   ? Hyperlipidemia Brother   ? Hypertension Brother   ? Diabetes Father   ? Breast cancer Maternal Aunt   ? ? ?Prior to Admission medications   ?Medication Sig Start Date End Date Taking? Authorizing Provider  ?acetaminophen (TYLENOL) 500 MG tablet Take 500 mg by mouth every 6 (six) hours as needed for moderate pain or headache.    [provider]  ?amLODipine (NORVASC) 10 MG tablet Take 1 tablet (10 mg total) by mouth daily. 11/07/21   Larey Dresser, MD  ?aspirin 81 MG EC tablet Take 1 tablet (81 mg total) by mouth daily. 09/26/21   Rafael Bihari, FNP  ?atorvastatin (LIPITOR) 40 MG tablet Take 1 tablet (40 mg total) by mouth daily. 11/07/21   Larey Dresser, MD  ?Blood Glucose Monitoring Suppl (ONETOUCH VERIO) w/Device KIT Check blood sugar three times daily. E11.40 05/06/20   [name], MD  ?calcium acetate (PHOSLO) 667 MG capsule TAKE 1 CAPSULE (667 MG TOTAL) BY MOUTH 3 (THREE) TIMES DAILY WITH MEALS. 02/08/22   [name], MD  ?carvedilol (COREG) 12.5 MG tablet Take 1.5 tablets (18.75 mg total) by mouth 2 (two) times daily with a meal. 11/07/21   Larey Dresser, MD  ?Continuous Blood Gluc Receiver (DEXCOM G6 RECEIVER) DEVI 1 Device by Does not apply route daily. 11/15/21   [name], MD  ?Continuous Blood Gluc Sensor (DEXCOM G6 SENSOR) MISC 1 packet by Does not apply route daily. 11/15/21   [name], MD  ?  Continuous Blood Gluc Transmit (DEXCOM G6 TRANSMITTER) MISC 1 packet by Does not apply route daily. 11/15/21   [name], MD  ?ferrous sulfate 325 (65 FE) MG tablet Take 1 tablet (325 mg total) by mouth daily with breakfast. 11/07/21   [name], MD  ?glucose blood test strip Use as instructedCheck blood sugar three times daily. E11.40 07/11/20   [name], MD  ?insulin glargine (LANTUS) 100 UNIT/ML Solostar Pen Inject 15 Units into the skin at bedtime. 02/21/22   Darliss Cheney, MD  ?insulin lispro (HUMALOG KWIKPEN) 100 UNIT/ML KwikPen Inject 4 units before breakfast and dinner. 11/07/21   [name], MD  ?Insulin Pen Needle 32G X 4 MM MISC Use to inject insulin as directed. 11/07/21   [name], MD  ?risperiDONE (RISPERDAL) 3 MG tablet TAKE 1 TABLET (3 MG TOTAL) BY MOUTH AT BEDTIME. 03/25/22 03/25/23  [name], MD  ?Insulin NPH Isophane & Regular (RELION 70/30 [place]) Inject 35 Units into the skin 2 (two) times daily.  08/17/14  [provider]   ? ? ?Physical Exam: ?Vitals:  ? 03/29/22 1805 03/29/22 1821 03/29/22 2047  ?BP: (!) 152/132  (!) 154/65  ?Pulse: 91  94  ?Resp: 18  15  ?Temp: 97.8 ?F (36.6 ?C)    ?TempSrc: Oral    ?SpO2: 100%  100%  ?Weight:  93.4 kg

## 2022-03-30 ENCOUNTER — Encounter (HOSPITAL_COMMUNITY): Payer: Self-pay | Admitting: Internal Medicine

## 2022-03-30 DIAGNOSIS — E11621 Type 2 diabetes mellitus with foot ulcer: Secondary | ICD-10-CM

## 2022-03-30 DIAGNOSIS — L03116 Cellulitis of left lower limb: Secondary | ICD-10-CM | POA: Diagnosis not present

## 2022-03-30 DIAGNOSIS — I5032 Chronic diastolic (congestive) heart failure: Secondary | ICD-10-CM

## 2022-03-30 DIAGNOSIS — D649 Anemia, unspecified: Secondary | ICD-10-CM

## 2022-03-30 DIAGNOSIS — N186 End stage renal disease: Secondary | ICD-10-CM

## 2022-03-30 DIAGNOSIS — Z992 Dependence on renal dialysis: Secondary | ICD-10-CM

## 2022-03-30 DIAGNOSIS — I10 Essential (primary) hypertension: Secondary | ICD-10-CM

## 2022-03-30 LAB — CBC
HCT: 20.5 % — ABNORMAL LOW (ref 36.0–46.0)
Hemoglobin: 6.2 g/dL — CL (ref 12.0–15.0)
MCH: 26.8 pg (ref 26.0–34.0)
MCHC: 30.2 g/dL (ref 30.0–36.0)
MCV: 88.7 fL (ref 80.0–100.0)
Platelets: 372 10*3/uL (ref 150–400)
RBC: 2.31 MIL/uL — ABNORMAL LOW (ref 3.87–5.11)
RDW: 15.9 % — ABNORMAL HIGH (ref 11.5–15.5)
WBC: 23.7 10*3/uL — ABNORMAL HIGH (ref 4.0–10.5)
nRBC: 0 % (ref 0.0–0.2)

## 2022-03-30 LAB — GLUCOSE, CAPILLARY
Glucose-Capillary: 189 mg/dL — ABNORMAL HIGH (ref 70–99)
Glucose-Capillary: 260 mg/dL — ABNORMAL HIGH (ref 70–99)
Glucose-Capillary: 357 mg/dL — ABNORMAL HIGH (ref 70–99)

## 2022-03-30 LAB — PREPARE RBC (CROSSMATCH)

## 2022-03-30 LAB — RESP PANEL BY RT-PCR (FLU A&B, COVID) ARPGX2
Influenza A by PCR: NEGATIVE
Influenza B by PCR: NEGATIVE
SARS Coronavirus 2 by RT PCR: NEGATIVE

## 2022-03-30 LAB — BASIC METABOLIC PANEL
Anion gap: 8 (ref 5–15)
BUN: 19 mg/dL (ref 8–23)
CO2: 29 mmol/L (ref 22–32)
Calcium: 7.9 mg/dL — ABNORMAL LOW (ref 8.9–10.3)
Chloride: 90 mmol/L — ABNORMAL LOW (ref 98–111)
Creatinine, Ser: 4.15 mg/dL — ABNORMAL HIGH (ref 0.44–1.00)
GFR, Estimated: 12 mL/min — ABNORMAL LOW (ref >60)
Glucose, Bld: 249 mg/dL — ABNORMAL HIGH (ref 70–99)
Potassium: 2.5 mmol/L — CL (ref 3.5–5.1)
Sodium: 127 mmol/L — ABNORMAL LOW (ref 135–145)

## 2022-03-30 LAB — PHOSPHORUS: Phosphorus: 2.7 mg/dL (ref 2.5–4.6)

## 2022-03-30 LAB — CBG MONITORING, ED
Glucose-Capillary: 227 mg/dL — ABNORMAL HIGH (ref 70–99)
Glucose-Capillary: 158 mg/dL — ABNORMAL HIGH (ref 70–99)

## 2022-03-30 LAB — HEPATITIS B SURFACE ANTIGEN: Hepatitis B Surface Ag: NONREACTIVE

## 2022-03-30 LAB — HEPATITIS B SURFACE ANTIBODY,QUALITATIVE: Hep B S Ab: NONREACTIVE

## 2022-03-30 LAB — TROPONIN I (HIGH SENSITIVITY): Troponin I (High Sensitivity): 19 ng/L — ABNORMAL HIGH (ref <18)

## 2022-03-30 LAB — MAGNESIUM: Magnesium: 2.1 mg/dL (ref 1.7–2.4)

## 2022-03-30 MED ORDER — POTASSIUM CHLORIDE CRYS ER 20 MEQ PO TBCR
40.0000 meq | EXTENDED_RELEASE_TABLET | Freq: Once | ORAL | Status: AC
  Administered 2022-03-30: 40 meq via ORAL
  Filled 2022-03-30: qty 2

## 2022-03-30 MED ORDER — CHLORHEXIDINE GLUCONATE CLOTH 2 % EX PADS
6.0000 | MEDICATED_PAD | Freq: Every day | CUTANEOUS | Status: DC
  Administered 2022-03-31 – 2022-04-08 (×8): 6 via TOPICAL

## 2022-03-30 MED ORDER — CALCIUM ACETATE (PHOS BINDER) 667 MG PO CAPS: 667.0000 mg | ORAL_CAPSULE | Freq: Three times a day (TID) | ORAL | Status: DC

## 2022-03-30 MED ORDER — INSULIN GLARGINE-YFGN 100 UNIT/ML ~~LOC~~ SOLN
10.0000 [IU] | Freq: Every day | SUBCUTANEOUS | Status: DC
  Administered 2022-03-30: 10 [IU] via SUBCUTANEOUS
  Filled 2022-03-30 (×2): qty 0.1

## 2022-03-30 MED ORDER — POTASSIUM CHLORIDE 10 MEQ/100ML IV SOLN
10.0000 meq | INTRAVENOUS | Status: AC
  Administered 2022-03-30 (×2): 10 meq via INTRAVENOUS
  Filled 2022-03-30 (×2): qty 100

## 2022-03-30 MED ORDER — SODIUM CHLORIDE 0.9% IV SOLUTION: Freq: Once | INTRAVENOUS | Status: DC

## 2022-03-30 MED ORDER — HEPARIN SODIUM (PORCINE) 1000 UNIT/ML DIALYSIS
2500.0000 [IU] | Freq: Once | INTRAMUSCULAR | Status: AC
  Administered 2022-03-30: 2500 [IU] via INTRAVENOUS_CENTRAL
  Filled 2022-03-30: qty 3

## 2022-03-30 MED ORDER — ATORVASTATIN CALCIUM 40 MG PO TABS
40.0000 mg | ORAL_TABLET | Freq: Every day | ORAL | Status: DC
Start: 2022-03-30 — End: 2022-04-10
  Administered 2022-03-30 – 2022-04-10 (×11): 40 mg via ORAL
  Filled 2022-03-30 (×13): qty 1

## 2022-03-30 MED ORDER — DIPHENHYDRAMINE HCL 25 MG PO CAPS
25.0000 mg | ORAL_CAPSULE | Freq: Once | ORAL | Status: AC
  Administered 2022-03-30: 25 mg via ORAL
  Filled 2022-03-30: qty 1

## 2022-03-30 MED ORDER — VANCOMYCIN HCL IN DEXTROSE 1-5 GM/200ML-% IV SOLN
1000.0000 mg | INTRAVENOUS | Status: AC
  Administered 2022-03-30: 1000 mg via INTRAVENOUS
  Filled 2022-03-30 (×3): qty 200

## 2022-03-30 NOTE — Consult Note (Signed)
Renal Service ?Consult Note ?[facility] ? ?Nancy [name] ?03/30/2022 ? D , MD ?Requesting Physician: Dr. G. Krishnan ? ?Reason for Consult: ESRD pt w/ osteomyelitis of L foot ?HPI: The patient is a 61 y.o. year-old w/ hx of CAD, CHF, ESRD on HD, CAD, depression, DM2, gastroparesis, HOH, HL, HTN, sp CVA presented to ED for infected wound on her L foot yesterday. Pt has had chronic L foot wounds and has previously refused amputation. In ED pt afeb, no ^HR or hypotension. Hb 7.5, WBC 27K, K 2.8. Ca 8.4. Alb 2.1. Xrays showed progression of osteo in the calcaneus. IV vanc/ zosyn were started. Asked to see for ESRD. ? ?Pt seen in room. Very HOH, but otherwise seems to respond appropriately when she understands the questions. Pt l/w her husband. She does not walk, is in the WC.  No tob/ etoh.  No cough, SOB or CP.   ? ? ?ROS - denies CP, no joint pain, no HA, no blurry vision, no rash, no diarrhea, no nausea/ vomiting ? ? ?Past Medical History  ?Past Medical History:  ?Diagnosis Date  ? Anemia 2006  ? Carotid artery disease (HCC)   ? right ICA occlusion, 40-59% LICA 02/2021 [place]  ? CHF (congestive heart failure) (HCC)   ? Chronic kidney disease   ? Coronary artery disease   ? Depression 2014  ? previously on amitryptiline   ? Diabetes mellitus with neurological manifestation (HCC) 2006  ? Diabetic peripheral neuropathy (HCC) 04/26/2020  ? Dyspnea   ? Fracture of left ankle 1997  ? Gastroparesis 07/2016  ? Heart murmur   ? HOH (hard of hearing) 2004  ? Hyperlipidemia 2006  ? Hypertension 2006  ? IBS (irritable bowel syndrome) 2002  ? Leukopenia 2015  ? Macular degeneration 11/2019  ? Shingles 2009  ? Stroke (HCC) 2020  ? Thyroid nodule 2004  ? ?Past Surgical History  ?Past Surgical History:  ?Procedure Laterality Date  ? ABDOMINAL HYSTERECTOMY  2005  ? AV FISTULA PLACEMENT Right 12/22/2021  ? Procedure: RIGHT ARM ARTERIOVENOUS (AV) BRACIOCPHELAIC FISTULA CREATION;  Surgeon: Cain, Brandon  Christopher, MD;  Location: [facility] OR;  Service: Vascular;  Laterality: Right;  ? AV FISTULA PLACEMENT Right 02/19/2022  ? Procedure: RIGHT ARTERIOVENOUS FISTULA;  Surgeon: Dickson, Christopher S, MD;  Location: [facility] OR;  Service: Vascular;  Laterality: Right;  ? CATARACT EXTRACTION Left 11/2019  ? CESAREAN SECTION  1983   ? CHOLECYSTECTOMY N/A 03/05/2020  ? Procedure: LAPAROSCOPIC CHOLECYSTECTOMY WITH INTRAOPERATIVE CHOLANGIOGRAM;  Surgeon: Tsuei, Matthew, MD;  Location: [facility] ORS;  Service: General;  Laterality: N/A;  ? ESOPHAGOGASTRODUODENOSCOPY (EGD) WITH PROPOFOL Left 08/26/2014  ? Procedure: ESOPHAGOGASTRODUODENOSCOPY (EGD) WITH PROPOFOL;  Surgeon: William Outlaw, MD;  Location: [facility] END[facility];  Service: Endoscopy;  Laterality: Left;  ? ESOPHAGOGASTRODUODENOSCOPY (EGD) WITH PROPOFOL N/A 03/03/2020  ? Procedure: ESOPHAGOGASTRODUODENOSCOPY (EGD) WITH PROPOFOL;  Surgeon: Pyrtle, Jay M, MD;  Location: [facility];  Service: Gastroenterology;  Laterality: N/A;  ? IR FLUORO GUIDE CV LINE RIGHT  02/15/2022  ? IR US GUIDE VASC ACCESS RIGHT  02/15/2022  ? RIGHT HEART CATH N/A 07/14/2021  ? Procedure: RIGHT HEART CATH;  Surgeon: McLean, Dalton S, MD;  Location: [facility];  Service: Cardiovascular;  Laterality: N/A;  ? RIGHT/LEFT HEART CATH AND CORONARY ANGIOGRAPHY N/A 04/27/2021  ? Procedure: RIGHT/LEFT HEART CATH AND CORONARY ANGIOGRAPHY;  Surgeon: Berry, Jonathan J, MD;  Location: [facility];  Service: Cardiovascular;  Laterality: N/A;  ? ?Family History  ?Family   History  ?Problem Relation Age of Onset  ? Hypertension Mother   ? Heart disease Mother   ? Diabetes Mother   ? Thyroid disease Mother   ? Congestive Heart Failure Mother   ? Breast cancer Maternal Grandmother   ? Colon cancer Maternal Grandfather   ? Heart attack Sister   ? Heart disease Brother   ? Hyperlipidemia Brother   ? Hypertension Brother   ? Diabetes Father   ? Breast cancer Maternal Aunt   ? ?Social History  reports that she has quit smoking. Her  smoking use included cigarettes. She has a 0.13 pack-year smoking history. She has never used smokeless tobacco. She reports that she does not drink alcohol and does not use drugs. ?Allergies  ?Allergies  ?Allergen Reactions  ? Hydralazine Hcl Other (See Comments)  ?  Hair loss  ? Hydrocodone Itching and Other (See Comments)  ?  Upset stomach  ? Metformin And Related Nausea And Vomiting and Other (See Comments)  ?  Stomach pains, also  ? Other Nausea Only and Other (See Comments)  ?  Lettuce- Does not digest this!!  ? Plaquenil [Hydroxychloroquine Sulfate] Hives  ? Shellfish-Derived Products Nausea Only and Other (See Comments)  ?  Caused an upset stomach  ? Shrimp (Diagnostic) Nausea Only and Other (See Comments)  ?  Upset stomach   ? Sulfa Antibiotics Hives  ? ?Home medications ?Prior to Admission medications   ?Medication Sig Start Date End Date Taking? Authorizing Provider  ?aspirin 81 MG EC tablet Take 1 tablet (81 mg total) by mouth daily. 09/26/21  Yes Milford, Jessica M, FNP  ?atorvastatin (LIPITOR) 40 MG tablet Take 1 tablet (40 mg total) by mouth daily. 11/07/21  Yes McLean, Dalton S, MD  ?calcium acetate (PHOSLO) 667 MG capsule TAKE 1 CAPSULE (667 MG TOTAL) BY MOUTH 3 (THREE) TIMES DAILY WITH MEALS. 02/08/22  Yes Johnson, Deborah B, MD  ?ferrous sulfate 325 (65 FE) MG tablet Take 1 tablet (325 mg total) by mouth daily with breakfast. 11/07/21  Yes Johnson, Deborah B, MD  ?insulin glargine (LANTUS) 100 UNIT/ML Solostar Pen Inject 15 Units into the skin at bedtime. 02/21/22  Yes Pahwani, Ravi, MD  ?insulin lispro (HUMALOG KWIKPEN) 100 UNIT/ML KwikPen Inject 4 units before breakfast and dinner. 11/07/21  Yes Johnson, Deborah B, MD  ?amLODipine (NORVASC) 10 MG tablet Take 1 tablet (10 mg total) by mouth daily. ?Patient not taking: Reported on 03/30/2022 11/07/21   McLean, Dalton S, MD  ?Blood Glucose Monitoring Suppl (ONETOUCH VERIO) w/Device KIT Check blood sugar three times daily. E11.40 05/06/20   Johnson,  Deborah B, MD  ?carvedilol (COREG) 12.5 MG tablet Take 1.5 tablets (18.75 mg total) by mouth 2 (two) times daily with a meal. ?Patient not taking: Reported on 03/30/2022 11/07/21   McLean, Dalton S, MD  ?Continuous Blood Gluc Receiver (DEXCOM G6 RECEIVER) DEVI 1 Device by Does not apply route daily. 11/15/21   Johnson, Deborah B, MD  ?Continuous Blood Gluc Sensor (DEXCOM G6 SENSOR) MISC 1 packet by Does not apply route daily. 11/15/21   Johnson, Deborah B, MD  ?Continuous Blood Gluc Transmit (DEXCOM G6 TRANSMITTER) MISC 1 packet by Does not apply route daily. 11/15/21   Johnson, Deborah B, MD  ?glucose blood test strip Use as instructedCheck blood sugar three times daily. E11.40 07/11/20   Johnson, Deborah B, MD  ?Insulin Pen Needle 32G X 4 MM MISC Use to inject insulin as directed. 11/07/21   Johnson, Deborah B, MD  ?  risperiDONE (RISPERDAL) 3 MG tablet TAKE 1 TABLET (3 MG TOTAL) BY MOUTH AT BEDTIME. ?Patient not taking: Reported on 03/30/2022 03/25/22 03/25/23  [name], MD  ?Insulin NPH Isophane & Regular (RELION 70/30 [place]) Inject 35 Units into the skin 2 (two) times daily.  08/17/14  [provider]  ? ? ? ?Vitals:  ? 03/30/22 0545 03/30/22 0630 03/30/22 0815 03/30/22 0845  ?BP: (!) 160/65 (!) 152/66  (!) 146/65  ?Pulse: 87 88  87  ?Resp: _0 ?Temp:      ?TempSrc:      ?SpO2: 97% 93%  96%  ?Weight:      ?Height:      ? ?Exam ?Gen alert, looks chronically ill, obese ?No rash, cyanosis or gangrene ?Sclera anicteric, throat clear  ?No jvd or bruits ?Chest clear bilat to bases, no rales/ wheezing ?RRR no RG ?Abd soft obese, ntnd no mass or ascites +bs ?GU defer ?MS no joint effusions or deformity ?Ext bilat 1+ pitting edema L > R leg; L foot wrapped ?Neuro is alert, Ox 3 , nf ?   RIJ TDC/ RUA AVF (maturing, +bruit) ? ? ? ? ? Home meds include - asa, lipitor, phoslo 1 ac, lantus and lispro insulin, norvasc 10, coreg 18.75 bid, risperdal 3 mg hs, prns/ vits/ supps ? ? ? OP HD: [facility] MWF ?  4h 15mn   400/1.5   95.5kg  2/2 bath  TDC (RUA AVF healing) Hep 2500 ? - last Hb 6.9 on 4/12 ? - mircera 200 q2, last 4/12, due 4/26 ? - venofer 100 x 10, 4 left ? - compliance= A, 2-4 kg ave uf ? ? ?Assessment/ Plan: ?L foot gang

## 2022-03-30 NOTE — Consult Note (Addendum)
?Hospital Consult ? ?VASCULAR SURGERY ASSESSMENT & PLAN:  ? ?NONSALVAGEABLE LEFT FOOT: Given the exposed calcaneus on the left and her markedly debilitated condition including diabetes, end-stage renal disease, and congestive heart failure I do not think the left foot is salvageable.  She has known infrainguinal arterial occlusive disease but no further vascular work-up is indicated.  As I understand that Dr. Sharol Given has been consulted for amputation.  Vascular surgery will be available as needed. ? ?Gae Gallop, MD ?10:31 AM ? ? ?Reason for Consult:  L foot wound ?Requesting Physician:  Dr. Maryland Pink ?MRN #:  [id number] ? ?History of Present Illness: This is a 61 y.o. female with past medical history significant for insulin-dependent diabetes mellitus, diastolic CHF, hypertension, hyperlipidemia, ESRD on HD, obesity, and PAD.  She is being seen in consultation for evaluation of left foot wound.  Patient is a poor historian and her husband is not currently present.  Patient states she has been living at home and has been wheelchair-bound.  She endorses pain to her left heel.  She is unable to communicate how long she has had this wound however based on chart review she has been in wound care for several months.  Work-up also included lab work demonstrating an elevated white blood cell count to 23,000 as well as an elevated lactate to 2.5. ? ?Past Medical History:  ?Diagnosis Date  ? Anemia 2006  ? Carotid artery disease ([place])   ? right ICA occlusion, 97-35% LICA 02/2991 [place]  ? CHF (congestive heart failure) ([place])   ? Chronic kidney disease   ? Coronary artery disease   ? Depression 2014  ? previously on amitryptiline   ? Diabetes mellitus with neurological manifestation ([place]) 2006  ? Diabetic peripheral neuropathy ([place]) 04/26/2020  ? Dyspnea   ? Fracture of left ankle 1997  ? Gastroparesis 07/2016  ? Heart murmur   ? HOH (hard of hearing) 2004  ? Hyperlipidemia 2006  ? Hypertension 2006  ? IBS (irritable bowel syndrome)  2002  ? Leukopenia 2015  ? Macular degeneration 11/2019  ? Shingles 2009  ? Stroke [facility] 2020  ? Thyroid nodule 2004  ? ? ?Past Surgical History:  ?Procedure Laterality Date  ? ABDOMINAL HYSTERECTOMY  2005  ? AV FISTULA PLACEMENT Right 12/22/2021  ? Procedure: RIGHT ARM ARTERIOVENOUS (AV) [place];  Surgeon: Waynetta Sandy, MD;  Location: [place];  Service: Vascular;  Laterality: Right;  ? AV FISTULA PLACEMENT Right 02/19/2022  ? Procedure: RIGHT ARTERIOVENOUS FISTULA;  Surgeon: Angelia Mould, MD;  Location: [place];  Service: Vascular;  Laterality: Right;  ? CATARACT EXTRACTION Left 11/2019  ? [place]   ? CHOLECYSTECTOMY N/A 03/05/2020  ? Procedure: LAPAROSCOPIC CHOLECYSTECTOMY WITH INTRAOPERATIVE CHOLANGIOGRAM;  Surgeon: Donnie Mesa, MD;  Location: [facility];  Service: General;  Laterality: N/A;  ? ESOPHAGOGASTRODUODENOSCOPY (EGD) WITH PROPOFOL Left 08/26/2014  ? Procedure: ESOPHAGOGASTRODUODENOSCOPY (EGD) WITH PROPOFOL;  Surgeon: Arta Silence, MD;  [facility];  Service: Endoscopy;  Laterality: Left;  ? ESOPHAGOGASTRODUODENOSCOPY (EGD) WITH PROPOFOL N/A 03/03/2020  ? Procedure: ESOPHAGOGASTRODUODENOSCOPY (EGD) WITH PROPOFOL;  Surgeon: Jerene Bears, MD;  [facility];  Service: Gastroenterology;  Laterality: N/A;  ? IR FLUORO GUIDE CV LINE RIGHT  02/15/2022  ? IR US GUIDE VASC ACCESS RIGHT  02/15/2022  ? RIGHT HEART CATH N/A 07/14/2021  ? Procedure: RIGHT HEART CATH;  Surgeon: Larey Dresser, MD;  Location: [place] [facility];  Service: Cardiovascular;  Laterality: N/A;  ?  RIGHT/LEFT HEART CATH AND CORONARY ANGIOGRAPHY N/A 04/27/2021  ? Procedure: RIGHT/LEFT HEART CATH AND CORONARY ANGIOGRAPHY;  Surgeon: [name], MD;  Location: [place] [facility];  Service: Cardiovascular;  Laterality: N/A;  ? ? ?Allergies  ?Allergen Reactions  ? Hydralazine Hcl Other (See Comments)  ?  Hair loss  ? Hydrocodone Itching and Other (See Comments)  ?  Upset  stomach  ? Metformin And Related Nausea And Vomiting and Other (See Comments)  ?  Stomach pains, also  ? Other Nausea Only and Other (See Comments)  ?  Lettuce- Does not digest this!!  ? Plaquenil [Hydroxychloroquine Sulfate] Hives  ? Shellfish-Derived Products Nausea Only and Other (See Comments)  ?  Caused an upset stomach  ? Shrimp (Diagnostic) Nausea Only and Other (See Comments)  ?  Upset stomach   ? Sulfa Antibiotics Hives  ? ? ?Prior to Admission medications   ?Medication Sig Start Date End Date Taking? Authorizing Provider  ?aspirin 81 MG EC tablet Take 1 tablet (81 mg total) by mouth daily. 09/26/21  Yes Milford, Maricela Bo, FNP  ?atorvastatin (LIPITOR) 40 MG tablet Take 1 tablet (40 mg total) by mouth daily. 11/07/21  Yes Larey Dresser, MD  ?calcium acetate (PHOSLO) 667 MG capsule TAKE 1 CAPSULE (667 MG TOTAL) BY MOUTH 3 (THREE) TIMES DAILY WITH MEALS. 02/08/22  Yes [name], MD  ?ferrous sulfate 325 (65 FE) MG tablet Take 1 tablet (325 mg total) by mouth daily with breakfast. 11/07/21  Yes [name], MD  ?insulin glargine (LANTUS) 100 UNIT/ML Solostar Pen Inject 15 Units into the skin at bedtime. 02/21/22  Yes Pahwani, Einar Grad, MD  ?insulin lispro (HUMALOG KWIKPEN) 100 UNIT/ML KwikPen Inject 4 units before breakfast and dinner. 11/07/21  Yes [name], MD  ?amLODipine (NORVASC) 10 MG tablet Take 1 tablet (10 mg total) by mouth daily. ?Patient not taking: Reported on 03/30/2022 11/07/21   Larey Dresser, MD  ?Blood Glucose Monitoring Suppl [facility] VERIO) w/Device KIT Check blood sugar three times daily. E11.40 05/06/20   [name], MD  ?carvedilol (COREG) 12.5 MG tablet Take 1.5 tablets (18.75 mg total) by mouth 2 (two) times daily with a meal. ?Patient not taking: Reported on 03/30/2022 11/07/21   Larey Dresser, MD  ?Continuous Blood Gluc Receiver (DEXCOM G6 RECEIVER) DEVI 1 Device by Does not apply route daily. 11/15/21   [name], MD  ?Continuous Blood Gluc  Sensor (DEXCOM G6 SENSOR) MISC 1 packet by Does not apply route daily. 11/15/21   [name], MD  ?Continuous Blood Gluc Transmit (DEXCOM G6 TRANSMITTER) MISC 1 packet by Does not apply route daily. 11/15/21   [name], MD  ?glucose blood test strip Use as instructedCheck blood sugar three times daily. E11.40 07/11/20   [name], MD  ?Insulin Pen Needle 32G X 4 MM MISC Use to inject insulin as directed. 11/07/21   [name], MD  ?risperiDONE (RISPERDAL) 3 MG tablet TAKE 1 TABLET (3 MG TOTAL) BY MOUTH AT BEDTIME. ?Patient not taking: Reported on 03/30/2022 03/25/22 03/25/23  [name], MD  ?Insulin NPH Isophane & Regular (RELION 70/30 [place]) Inject 35 Units into the skin 2 (two) times daily.  08/17/14  [provider]  ? ? ?Social History  ? ?Socioeconomic History  ? Marital status: Legally Separated  ?  Spouse name: [name]  ? Number of children: 1  ? Years of education: some colle  ? Highest education  level: Not on file  ?Occupational History  ? Occupation: Employed FT as Landscape architect  ?  Comment: [employer] , [place]  ?Tobacco Use  ? Smoking status: Former  ?  Packs/day: 0.25  ?  Years: 0.50  ?  Pack years: 0.13  ?  Types: Cigarettes  ? Smokeless tobacco: Never  ? Tobacco comments:  ?  Former smoker 11/03/21  ?Vaping Use  ? Vaping Use: Never used  ?Substance and Sexual Activity  ? Alcohol use: No  ? Drug use: No  ? Sexual activity: Yes  ?  Birth control/protection: None  ?Other Topics Concern  ? Not on file  ?Social History Narrative  ? 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 ?Social Determinants of Health  ? ?Financial Resource Strain: Low Risk   ? Difficulty of Paying Living Expenses: Not very hard  ?Food Insecurity: No Food Insecurity  ? Worried About Charity fundraiser in the Last Year: Never true  ? Ran Out of Food in the Last Year: Never true  ?Transportation Needs: No Transportation Needs  ? Lack of Transportation (Medical): No  ?  Lack of Transportation (Non-Medical): No  ?Physical Activity: Not on file  ?Stress: Not on file  ?Social Connections: Not on file  ?Intimate Partner Violence: Not on file  ? ? ? ?Family History  ?Probl

## 2022-03-30 NOTE — Procedures (Signed)
? ?  I was present at this dialysis session, have reviewed the session itself and made  appropriate changes ?Kelly Splinter MD ?[employer] ?pager [phone]   ?03/30/2022, 2:37 PM ? ? ?

## 2022-03-30 NOTE — Progress Notes (Addendum)
? ?[facility] ?PROGRESS NOTE ? ? ?Nancy THURLOW WNI:[id number] DOB: 1961/03/21 DOA: 03/29/2022  1 ?DOS: the patient was seen and examined on 03/30/2022 ? ?PCP: [name], MD ? ?Brief History and Hospital Course:  ?61 y.o. 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  Patient has chronic wounds of her left foot and heel for which she has previously refused amputation.  She was sent to the ED by wound care as her wounds appeared infected.  She was admitted in September last year and given antibiotics and discharged.  At that time, MRI did not show evidence of osteomyelitis.  ABIs revealed moderate bilateral lower extremity arterial disease.  Amputation was recommended but patient refused.  Admitted again in November with x-rays showing signs of osteomyelitis, amputation was again recommended but patient refused.  X-ray of the foot showed extensive soft tissue swelling with findings of osteomyelitis in the calcaneus.  Patient was hospitalized for further management.  Patient noted to be a poor historian. ? ?Consultants: Vascular surgery.  Nephrology.  Discussed with Dr. Sharol Given ? ?Procedures: None yet ? ? ? ?Subjective: ?Patient very poor historian.  Not answering all questions appropriately.  Denies any significant pain in the left foot.  Denies any nausea this morning.  No abdominal pain. ? ? ? ?Assessment/Plan: ? ?* Diabetic foot ulcers ([place]) ?Left foot gangrenous diabetic wounds with cellulitis and osteomyelitis ?PAD ? ?X-ray showing extensive soft tissue infection with findings of osteomyelitis progressed in the calcaneus. ?Patient started on vancomycin and Zosyn which will be continued.  WBC was noted to be significantly elevated.  Continue to trend.  No other sepsis criteria present. ?Discussed with Dr. Sharol Given with  orthopedics.   ?Patient noted to have vascular disease on arterial Dopplers done recently in January.  In view of this will consult vascular surgery to assist with management. ?Continue pain medications.  Follow-up on cultures. ? ?Nausea & vomiting ?Likely due to constipation.  Abdominal film was unremarkable.  Abdomen is benign on examination.  Diabetic gastroparesis is also possibility.  Continue to monitor for now.  Scopolamine patch was ordered.   ? ?ESRD on dialysis [facility]) ?Dialyzed on Monday Wednesday Friday schedule.  Nephrology notified.   ? ?Hypokalemia ?QT prolongation ? ?Patient was given potassium overnight. ? ?Chronic diastolic CHF (congestive heart failure) ([place]) ?Echo done October 2022 showing LVEF 50 to 55% and grade 2 diastolic dysfunction.  No significant volume overload. ?-Volume management per dialysis ? ?Insulin dependent type 2 diabetes mellitus ([place]) ?Presented with a glucose level greater than 400.  HbA1c 6.3 in March. ?Continue SSI for now.  Home medication list reviewed.  Noted to be on Lantus 15 units at bedtime.  This can be resumed.   ? ?CAD (coronary artery disease) ?Cardiac status is stable. ? ?Chronic anemia ?Likely due to kidney disease.  Drop in hemoglobin noted from 7.5-6.2.  No overt bleeding identified.  Patient to be transfused PRBC with hemodialysis.  Discussed with nephrology this morning.   ? ?HTN (hypertension) ?Carvedilol and amlodipine mention on her home medication list.  However there is a note stating that these were discontinued by the dialysis clinic recently.   ? ? ?Obesity ?Estimated body mass index is 36.48 kg/m? as calculated from the following: ?  Height as of this encounter: '5\' 3"'$  (1.6 m). ?  Weight as of this encounter: 93.4 kg. ? ? ?DVT  Prophylaxis: None in case a procedure is needed today. ?Code Status: DNR ?Family Communication: Discussed with patient.  No family at bedside. ?Disposition Plan: To be determined ? ?Status is: Inpatient ?Remains inpatient  appropriate because: Cellulitis osteomyelitis left foot ? ? ? ? ?Medications: Scheduled: ? sodium chloride   Intravenous Once  ? atorvastatin  40 mg Oral Daily  ? calcium acetate  667 mg Oral TID WC  ? insulin aspart  0-6 Units Subcutaneous Q4H  ? [START ON 04/01/2022] scopolamine  1 patch Transdermal Q72H  ? ?Continuous: ? piperacillin-tazobactam (ZOSYN)  IV Stopped (03/30/22 0744)  ? vancomycin    ? ?[id number] **OR** acetaminophen, LORazepam, oxyCODONE ? ?Antibiotics: ?Anti-infectives (From admission, onward)  ? ? Start     Dose/Rate Route Frequency Ordered Stop  ? 03/30/22 1200  vancomycin (VANCOCIN) IVPB 1000 mg/200 mL premix       ? 1,000 mg ?200 mL/hr over 60 Minutes Intravenous Every M-W-F (Hemodialysis) 03/30/22 0940    ? 03/30/22 0600  piperacillin-tazobactam (ZOSYN) IVPB 2.25 g       ? 2.25 g ?100 mL/hr over 30 Minutes Intravenous Every 6 hours 03/29/22 2124    ? 03/29/22 2130  vancomycin (VANCOREADY) IVPB 2000 mg/400 mL       ? 2,000 mg ?200 mL/hr over 120 Minutes Intravenous  Once 03/29/22 2103 03/30/22 0139  ? 03/29/22 2119  vancomycin variable dose per unstable renal function (pharmacist dosing)  Status:  Discontinued       ?  Does not apply See admin instructions 03/29/22 2124 03/30/22 0940  ? 03/29/22 2115  piperacillin-tazobactam (ZOSYN) IVPB 3.375 g       ? 3.375 g ?100 mL/hr over 30 Minutes Intravenous  Once 03/29/22 2103 03/29/22 2240  ? ?  ? ? ?Objective: ? ?Vital Signs ? ?Vitals:  ? 03/30/22 0545 03/30/22 0630 03/30/22 0815 03/30/22 0845  ?BP: (!) 160/65 (!) 152/66  (!) 146/65  ?Pulse: 87 88  87  ?Resp: '18 18 20   '$ ?Temp:      ?TempSrc:      ?SpO2: 97% 93%  96%  ?Weight:      ?Height:      ? ?No intake or output data in the 24 hours ending 03/30/22 0940 ?Filed Weights  ? 03/29/22 1821  ?Weight: 93.4 kg  ? ? ?General appearance: Awake alert.  In no distress.  Distracted ?Resp: Clear to auscultation bilaterally.  Normal effort ?Cardio: S1-S2 is normal regular.  No S3-S4.  No rubs murmurs or  bruit ?GI: Abdomen is soft.  Nontender nondistended.  Bowel sounds are present normal.  No masses organomegaly ?Extremities: Left foot covered in dressing ?Neurologic: No focal neurological deficits.  ? ? ?Lab Results: ? ?Data Reviewed: I have personally reviewed labs and imaging study reports ? ?CBC: ?Recent Labs  ?Lab 03/29/22 ?1839 03/30/22 ?0330  ?WBC 27.0* 23.7*  ?NEUTROABS 23.2*  --   ?HGB 7.5* 6.2*  ?HCT 25.2* 20.5*  ?MCV 90.0 88.7  ?PLT 422* 372  ? ? ?Basic Metabolic Panel: ?Recent Labs  ?Lab 03/29/22 ?1839 03/30/22 ?0330  ?NA 128* 127*  ?K 2.8* 2.5*  ?CL 87* 90*  ?CO2 28 29  ?GLUCOSE 409* 249*  ?BUN 17 19  ?CREATININE 3.95* 4.15*  ?CALCIUM 8.4* 7.9*  ?MG  --  2.1  ? ? ?GFR: ?Estimated Creatinine Clearance: 15.5 mL/min (A) (by C-G formula based on SCr of 4.15 mg/dL (H)). ? ?Liver Function Tests: ?Recent Labs  ?Lab 03/29/22 ?1839  ?AST 13*  ?ALT 14  ?  ALKPHOS 86  ?BILITOT 0.3  ?PROT 7.8  ?ALBUMIN 2.1*  ? ? ?Coagulation Profile: ?Recent Labs  ?Lab 03/29/22 ?1839  ?INR 1.2  ? ? ? ?CBG: ?Recent Labs  ?Lab 03/29/22 ?2344 03/30/22 ?0102 03/30/22 ?7253  ?GLUCAP 346* 227* 158*  ? ? ? ?Recent Results (from the past 240 hour(s))  ?Culture, blood (Routine x 2)     Status: None (Preliminary result)  ? Collection Time: 03/29/22  6:39 PM  ? Specimen: BLOOD  ?Result Value Ref Range Status  ? Specimen Description BLOOD LEFT ANTECUBITAL  Final  ? Special Requests   Final  ?  BOTTLES DRAWN AEROBIC ONLY Blood Culture adequate volume  ? Culture   Final  ?  NO GROWTH < 24 HOURS ?Performed at [facility], [place] [address] [place], [place] [address] ?  ? Report Status PENDING  Incomplete  ?Resp Panel by RT-PCR (Flu A&B, Covid) Nasopharyngeal Swab     Status: None  ? Collection Time: 03/30/22  3:50 AM  ? Specimen: Nasopharyngeal Swab; Nasopharyngeal(NP) swabs in vial transport medium  ?Result Value Ref Range Status  ? SARS Coronavirus 2 by RT PCR NEGATIVE NEGATIVE Final  ?  Comment: (NOTE) ?SARS-CoV-2 target nucleic acids are  NOT DETECTED. ? ?The SARS-CoV-2 RNA is generally detectable in upper respiratory ?specimens during the acute phase of infection. The lowest ?concentration of SARS-CoV-2 viral copies this assay can detect is ?138 c

## 2022-03-30 NOTE — Progress Notes (Addendum)
removed 5 liters overall,  3500 net, gave patient 2 units of blood and vancomycin infusion as ordered.  system clotted in between units of blood blood loss approx 190ms.  gave 2500 units of heparin as ordered. pre bp 166/76 post bp 126/81 unable to weight pt on stretcher with no scales and she cannot stand. catheter very positional alarmed every time patient coughed or grunted which was frequent.  did not matter if lines were reg or reversed.  changed dressing to right chest. ?Gave pt po benadryl for itching pre tx as ordered.  ?

## 2022-03-30 NOTE — ED Notes (Signed)
Pt care taken, wants pain medication, is alert, does not speak well ?

## 2022-03-30 NOTE — Assessment & Plan Note (Addendum)
BP controlled ?- Continue metoprolol, amlodipine ?

## 2022-03-30 NOTE — Plan of Care (Signed)
  Problem: Clinical Measurements: Goal: Respiratory complications will improve Outcome: Progressing   Problem: Nutrition: Goal: Adequate nutrition will be maintained Outcome: Progressing   Problem: Pain Managment: Goal: General experience of comfort will improve Outcome: Progressing    

## 2022-03-30 NOTE — Hospital Course (Addendum)
Nancy [name] is a 61 y.o. F with ESRD on HD, DM, neuropathy, dCHF, HTN, HLD, PVD, hx CVA, CAD, and obesity BMI 36 who was sent from wound clinic due to swelling, drainage from her chronically infected heel wound.   ? ? ?4/13: Admitted on antibiotics, found to have exposed calcaneous with extensive surrounding tissue loss ?4/14: Vascular surgery consulted, given above, no role for limb salvage ?4/15: Orthopedics consulted, recommended bilateral BKAs ?4/17: Family and patient decided to proceed with amputation ?4/19: To OR for bilateral amputation by Dr. Sharol Given ?8/91-6/94: Uncomplicated post-op course, pending SNF ?

## 2022-03-30 NOTE — Progress Notes (Signed)
Pt husband, Riannah Stagner at bedside.  Husband request all updates be called to him at [phone].  Husband believes foot is salvageable and does not understand why foot cannot be revascularized.   ?

## 2022-03-31 DIAGNOSIS — E11621 Type 2 diabetes mellitus with foot ulcer: Secondary | ICD-10-CM | POA: Diagnosis not present

## 2022-03-31 DIAGNOSIS — Z992 Dependence on renal dialysis: Secondary | ICD-10-CM

## 2022-03-31 DIAGNOSIS — E119 Type 2 diabetes mellitus without complications: Secondary | ICD-10-CM | POA: Diagnosis not present

## 2022-03-31 DIAGNOSIS — N186 End stage renal disease: Secondary | ICD-10-CM | POA: Diagnosis not present

## 2022-03-31 DIAGNOSIS — D649 Anemia, unspecified: Secondary | ICD-10-CM | POA: Diagnosis not present

## 2022-03-31 DIAGNOSIS — M86271 Subacute osteomyelitis, right ankle and foot: Secondary | ICD-10-CM

## 2022-03-31 DIAGNOSIS — Z794 Long term (current) use of insulin: Secondary | ICD-10-CM

## 2022-03-31 DIAGNOSIS — L97529 Non-pressure chronic ulcer of other part of left foot with unspecified severity: Secondary | ICD-10-CM

## 2022-03-31 DIAGNOSIS — M869 Osteomyelitis, unspecified: Secondary | ICD-10-CM | POA: Diagnosis not present

## 2022-03-31 DIAGNOSIS — I739 Peripheral vascular disease, unspecified: Secondary | ICD-10-CM

## 2022-03-31 LAB — TYPE AND SCREEN
ABO/RH(D): A POS
Antibody Screen: NEGATIVE
Unit division: 0
Unit division: 0

## 2022-03-31 LAB — CBC
HCT: 28.1 % — ABNORMAL LOW (ref 36.0–46.0)
Hemoglobin: 9.2 g/dL — ABNORMAL LOW (ref 12.0–15.0)
MCH: 28.4 pg (ref 26.0–34.0)
MCHC: 32.7 g/dL (ref 30.0–36.0)
MCV: 86.7 fL (ref 80.0–100.0)
Platelets: 355 10*3/uL (ref 150–400)
RBC: 3.24 MIL/uL — ABNORMAL LOW (ref 3.87–5.11)
RDW: 15.9 % — ABNORMAL HIGH (ref 11.5–15.5)
WBC: 24.7 10*3/uL — ABNORMAL HIGH (ref 4.0–10.5)
nRBC: 0.1 % (ref 0.0–0.2)

## 2022-03-31 LAB — BPAM RBC
Blood Product Expiration Date: 202305032359
Blood Product Expiration Date: 202305042359
ISSUE DATE / TIME: 202304141249
ISSUE DATE / TIME: 202304141249
Unit Type and Rh: 6200
Unit Type and Rh: 6200

## 2022-03-31 LAB — BASIC METABOLIC PANEL
Anion gap: 10 (ref 5–15)
BUN: 16 mg/dL (ref 8–23)
CO2: 26 mmol/L (ref 22–32)
Calcium: 7.9 mg/dL — ABNORMAL LOW (ref 8.9–10.3)
Chloride: 97 mmol/L — ABNORMAL LOW (ref 98–111)
Creatinine, Ser: 3.22 mg/dL — ABNORMAL HIGH (ref 0.44–1.00)
GFR, Estimated: 16 mL/min — ABNORMAL LOW (ref >60)
Glucose, Bld: 240 mg/dL — ABNORMAL HIGH (ref 70–99)
Potassium: 3.7 mmol/L (ref 3.5–5.1)
Sodium: 133 mmol/L — ABNORMAL LOW (ref 135–145)

## 2022-03-31 LAB — GLUCOSE, CAPILLARY
Glucose-Capillary: 207 mg/dL — ABNORMAL HIGH (ref 70–99)
Glucose-Capillary: 228 mg/dL — ABNORMAL HIGH (ref 70–99)
Glucose-Capillary: 213 mg/dL — ABNORMAL HIGH (ref 70–99)
Glucose-Capillary: 148 mg/dL — ABNORMAL HIGH (ref 70–99)
Glucose-Capillary: 163 mg/dL — ABNORMAL HIGH (ref 70–99)

## 2022-03-31 LAB — HEPATITIS B SURFACE ANTIBODY, QUANTITATIVE: Hep B S AB Quant (Post): 40.6 m[IU]/mL (ref >9.9)

## 2022-03-31 MED ORDER — INSULIN GLARGINE-YFGN 100 UNIT/ML ~~LOC~~ SOLN
14.0000 [IU] | Freq: Every day | SUBCUTANEOUS | Status: DC
  Administered 2022-03-31 – 2022-04-02 (×3): 14 [IU] via SUBCUTANEOUS
  Filled 2022-03-31 (×4): qty 0.14

## 2022-03-31 MED ORDER — HEPARIN SODIUM (PORCINE) 5000 UNIT/ML IJ SOLN
5000.0000 [IU] | Freq: Three times a day (TID) | INTRAMUSCULAR | Status: DC
  Administered 2022-03-31 – 2022-04-10 (×27): 5000 [IU] via SUBCUTANEOUS
  Filled 2022-03-31 (×27): qty 1

## 2022-03-31 MED ORDER — PIPERACILLIN-TAZOBACTAM IN DEX 2-0.25 GM/50ML IV SOLN
2.2500 g | Freq: Three times a day (TID) | INTRAVENOUS | Status: AC
  Administered 2022-03-31 – 2022-04-05 (×15): 2.25 g via INTRAVENOUS
  Filled 2022-03-31 (×17): qty 50

## 2022-03-31 NOTE — Consult Note (Signed)
? ?ORTHOPAEDIC CONSULTATION ? ?REQUESTING PHYSICIAN: Bonnielee Haff, MD ? ?Chief Complaint: Chronic ulcerations both heels. ? ?HPI: ?Nancy [name] is a 61 y.o. female who presents with diabetes peripheral vascular disease status post stroke with chronic ulceration both lower extremities. ? ?Past Medical History:  ?Diagnosis Date  ? Anemia 2006  ? Carotid artery disease ([place])   ? 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 CHF (congestive heart failure) ([place])   ? Chronic kidney disease   ? Coronary artery disease   ? Depression 2014  ? previously on amitryptiline   ? Diabetes mellitus with neurological manifestation ([place]) 2006  ? Diabetic peripheral neuropathy ([place]) 04/26/2020  ? Dyspnea   ? Fracture of left ankle 1997  ? Gastroparesis 07/2016  ? Heart murmur   ? HOH (hard of hearing) 2004  ? Hyperlipidemia 2006  ? Hypertension 2006  ? IBS (irritable bowel syndrome) 2002  ? Leukopenia 2015  ? Macular degeneration 11/2019  ? Shingles 2009  ? Stroke [facility]) 2020  ? Thyroid nodule 2004  ? ?Past Surgical History:  ?Procedure Laterality Date  ? ABDOMINAL HYSTERECTOMY  2005  ? AV FISTULA PLACEMENT Right 12/22/2021  ? Procedure: RIGHT ARM ARTERIOVENOUS (AV) [place];  Surgeon: Waynetta Sandy, MD;  Location: [facility];  Service: Vascular;  Laterality: Right;  ? AV FISTULA PLACEMENT Right 02/19/2022  ? Procedure: RIGHT ARTERIOVENOUS FISTULA;  Surgeon: Angelia Mould, MD;  Location: [facility];  Service: Vascular;  Laterality: Right;  ? CATARACT EXTRACTION Left 11/2019  ? [place]   ? CHOLECYSTECTOMY N/A 03/05/2020  ? Procedure: LAPAROSCOPIC CHOLECYSTECTOMY WITH INTRAOPERATIVE CHOLANGIOGRAM;  Surgeon: Donnie Mesa, MD;  Location: [facility];  Service: General;  Laterality: N/A;  ? ESOPHAGOGASTRODUODENOSCOPY (EGD) WITH PROPOFOL Left 08/26/2014  ? Procedure: ESOPHAGOGASTRODUODENOSCOPY (EGD) WITH PROPOFOL;  Surgeon: Arta Silence, MD;  [facility];  Service: Endoscopy;   Laterality: Left;  ? ESOPHAGOGASTRODUODENOSCOPY (EGD) WITH PROPOFOL N/A 03/03/2020  ? Procedure: ESOPHAGOGASTRODUODENOSCOPY (EGD) WITH PROPOFOL;  Surgeon: Jerene Bears, MD;  [facility];  Service: Gastroenterology;  Laterality: N/A;  ? IR FLUORO GUIDE CV LINE RIGHT  02/15/2022  ? IR US GUIDE VASC ACCESS RIGHT  02/15/2022  ? RIGHT HEART CATH N/A 07/14/2021  ? Procedure: RIGHT HEART CATH;  Surgeon: Larey Dresser, MD;  Location: [facility];  Service: Cardiovascular;  Laterality: N/A;  ? RIGHT/LEFT HEART CATH AND CORONARY ANGIOGRAPHY N/A 04/27/2021  ? Procedure: RIGHT/LEFT HEART CATH AND CORONARY ANGIOGRAPHY;  Surgeon: [name], MD;  Location: [facility];  Service: Cardiovascular;  Laterality: N/A;  ? ?Social History  ? ?Socioeconomic History  ? Marital status: Legally Separated  ?  Spouse name: [name]  ? Number of children: 1  ? Years of education: some colle  ? Highest education level: Not on file  ?Occupational History  ? Occupation: Employed FT as Landscape architect  ?  Comment: [place] , [place]  ?Tobacco Use  ? Smoking status: Former  ?  Packs/day: 0.25  ?  Years: 0.50  ?  Pack years: 0.13  ?  Types: Cigarettes  ? Smokeless tobacco: Never  ? Tobacco comments:  ?  Former smoker 11/03/21  ?Vaping Use  ? Vaping Use: Never used  ?Substance and Sexual Activity  ? Alcohol use: No  ? Drug use: No  ? Sexual activity: Yes  ?  Birth control/protection: None  ?Other Topics Concern  ? Not on file  ?Social History Narrative  ?  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 ?Social Determinants of Health  ? ?Financial Resource Strain: Low Risk   ? Difficulty of Paying Living Expenses: Not very hard  ?Food Insecurity: No Food Insecurity  ? Worried About Charity fundraiser in the Last Year: Never true  ? Ran Out of Food in the Last Year: Never true  ?Transportation Needs: No Transportation Needs  ? Lack of Transportation (Medical): No  ? Lack of Transportation (Non-Medical):  No  ?Physical Activity: Not on file  ?Stress: Not on file  ?Social Connections: Not on file  ? ?Family History  ?Problem Relation Age of Onset  ? Hypertension Mother   ? Heart disease Mother   ? Diabetes Mother   ? Thyroid disease Mother   ? Congestive Heart Failure Mother   ? Breast cancer Maternal Grandmother   ? Colon cancer Maternal Grandfather   ? Heart attack Sister   ? Heart disease Brother   ? Hyperlipidemia Brother   ? Hypertension Brother   ? Diabetes Father   ? Breast cancer Maternal Aunt   ? ?- negative except otherwise stated in the family history section ?Allergies  ?Allergen Reactions  ? Hydralazine Hcl Other (See Comments)  ?  Hair loss  ? Hydrocodone Itching and Other (See Comments)  ?  Upset stomach  ? Metformin And Related Nausea And Vomiting and Other (See Comments)  ?  Stomach pains, also  ? Other Nausea Only and Other (See Comments)  ?  Lettuce- Does not digest this!!  ? Plaquenil [Hydroxychloroquine Sulfate] Hives  ? Shellfish-Derived Products Nausea Only and Other (See Comments)  ?  Caused an upset stomach  ? Shrimp (Diagnostic) Nausea Only and Other (See Comments)  ?  Upset stomach   ? Sulfa Antibiotics Hives  ? ?Prior to Admission medications   ?Medication Sig Start Date End Date Taking? Authorizing Provider  ?aspirin 81 MG EC tablet Take 1 tablet (81 mg total) by mouth daily. 09/26/21  Yes Mil[place], Maricela Bo, FNP  ?atorvastatin (LIPITOR) 40 MG tablet Take 1 tablet (40 mg total) by mouth daily. 11/07/21  Yes Larey Dresser, MD  ?calcium acetate (PHOSLO) 667 MG capsule TAKE 1 CAPSULE (667 MG TOTAL) BY MOUTH 3 (THREE) TIMES DAILY WITH MEALS. 02/08/22  Yes [name], MD  ?ferrous sulfate 325 (65 FE) MG tablet Take 1 tablet (325 mg total) by mouth daily with breakfast. 11/07/21  Yes [name], MD  ?insulin glargine (LANTUS) 100 UNIT/ML Solostar Pen Inject 15 Units into the skin at bedtime. 02/21/22  Yes Pahwani, Einar Grad, MD  ?insulin lispro (HUMALOG KWIKPEN) 100 UNIT/ML KwikPen  Inject 4 units before breakfast and dinner. 11/07/21  Yes [name], MD  ?amLODipine (NORVASC) 10 MG tablet Take 1 tablet (10 mg total) by mouth daily. ?Patient not taking: Reported on 03/30/2022 11/07/21   Larey Dresser, MD  ?Blood Glucose Monitoring Suppl [facility] VERIO) w/Device KIT Check blood sugar three times daily. E11.40 05/06/20   [name], MD  ?carvedilol (COREG) 12.5 MG tablet Take 1.5 tablets (18.75 mg total) by mouth 2 (two) times daily with a meal. ?Patient not taking: Reported on 03/30/2022 11/07/21   Larey Dresser, MD  ?Continuous Blood Gluc Receiver (DEXCOM G6 RECEIVER) DEVI 1 Device by Does not apply route daily. 11/15/21   [name], MD  ?Continuous Blood Gluc Sensor (DEXCOM G6 SENSOR) MISC 1 packet by Does not apply route daily. 11/15/21  [name], MD  ?Continuous Blood Gluc Transmit (DEXCOM G6 TRANSMITTER) MISC 1 packet by Does not apply route daily. 11/15/21   [name], MD  ?glucose blood test strip Use as instructedCheck blood sugar three times daily. E11.40 07/11/20   [name], MD  ?Insulin Pen Needle 32G X 4 MM MISC Use to inject insulin as directed. 11/07/21   [name], MD  ?risperiDONE (RISPERDAL) 3 MG tablet TAKE 1 TABLET (3 MG TOTAL) BY MOUTH AT BEDTIME. ?Patient not taking: Reported on 03/30/2022 03/25/22 03/25/23  [name], MD  ?Insulin NPH Isophane & Regular (RELION 70/30 [place]) Inject 35 Units into the skin 2 (two) times daily.  08/17/14  [provider]  ? ?DG Abd 1 View ? ?Result Date: 03/29/2022 ?CLINICAL DATA:  Wound infection. EXAM: ABDOMEN - 1 VIEW COMPARISON:  None. FINDINGS: The bowel gas pattern is normal. Cholecystectomy clips are present. Generator overlies the right mid abdomen. Osseous structures are within normal limits. Calcifications in the pelvis are likely vascular. IMPRESSION: 1. Nonobstructive bowel gas pattern. Electronically Signed   By: Ronney Asters M.D.   On: 03/29/2022 23:17   ? ?DG Foot Complete Left ? ?Result Date: 03/29/2022 ?CLINICAL DATA:  Possible osteomyelitis EXAM: LEFT FOOT - COMPLETE 3+ VIEW COMPARISON:  10/27/2021, 02/01/2022 FINDINGS: No acute fracture or dislocation i

## 2022-03-31 NOTE — Progress Notes (Signed)
? ?[facility] ?PROGRESS NOTE ? ? ?JONA ERKKILA UXN:[id number] DOB: Dec 14, 1961 DOA: 03/29/2022  2 ?DOS: the patient was seen and examined on 03/31/2022 ? ?PCP: [name], MD ? ?Brief History and Hospital Course:  ?61 y.o. 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  Patient has chronic wounds of her left foot and heel for which she has previously refused amputation.  She was sent to the ED by wound care as her wounds appeared infected.  She was admitted in September last year and given antibiotics and discharged.  At that time, MRI did not show evidence of osteomyelitis.  ABIs revealed moderate bilateral lower extremity arterial disease.  Amputation was recommended but patient refused.  Admitted again in November with x-rays showing signs of osteomyelitis, amputation was again recommended but patient refused.  X-ray of the foot showed extensive soft tissue swelling with findings of osteomyelitis in the calcaneus.  Patient was hospitalized for further management.  Patient noted to be a poor historian. ? ?Consultants: Vascular surgery.  Nephrology.  Discussed with Dr. Sharol Given ? ?Procedures: None yet ? ? ? ?Subjective: ?Patient very poor historian.  Seems to be lying comfortably on the bed.  Denies any significant pain.   ? ? ? ?Assessment/Plan: ? ?* Diabetic foot ulcers ([place]) ?Gangrenous ulcers of both heels with cellulitis and osteomyelitis ?PAD ? ?X-ray showing extensive soft tissue infection with findings of osteomyelitis progressed in the calcaneus. ?Patient started on vancomycin and Zosyn which will be continued.  WBC was noted to be significantly elevated.  Continue to trend.  No other sepsis criteria present. ?Patient seen by vascular surgery.  They felt that the left foot was nonsalvageable.  Did not feel that she  would benefit from any vascular interventions.  Subsequently Dr. Sharol Given was consulted. ?Continue pain medications.  Follow-up on cultures. ? ?Nausea & vomiting ?Likely due to constipation.  Abdominal film was unremarkable.  Abdomen is benign on examination.  Diabetic gastroparesis is also possibility.  Continue to monitor for now.  Scopolamine patch was ordered.   ?No further episodes of nausea and vomiting noted. ? ?ESRD on dialysis [facility]) ?Dialyzed on Monday Wednesday Friday schedule.  Nephrology following.   ? ?Hypokalemia ?QT prolongation ? ?Improved with supplementation. ? ?Chronic diastolic CHF (congestive heart failure) ([place]) ?Echo done October 2022 showing LVEF 50 to 55% and grade 2 diastolic dysfunction.  No significant volume overload. ?-Volume management per dialysis ? ?Insulin dependent type 2 diabetes mellitus ([place]) ?Presented with a glucose level greater than 400.  HbA1c 6.3 in March. ?Currently on glargine and SSI.  CBGs remain poorly controlled.  Will need dose adjustment. ? ?CAD (coronary artery disease) ?Cardiac status is stable. ? ?Chronic anemia ?Likely due to kidney disease.  ?Hemoglobin did drop from 7.5-6.2.  She was transfused 2 units of PRBC with dialysis.  Hemoglobin 9.2 today.  No overt bleeding noted.  ? ?HTN (hypertension) ?Carvedilol and amlodipine mention on her home medication list.  However there is a note stating that these were discontinued by the dialysis clinic recently.   ?Blood pressure reasonably well controlled.  We will continue to monitor off of antihypertensives. ? ? ?Obesity ?Estimated body mass index is 36.48 kg/m? as calculated from the following: ?  Height as of this encounter: '5\' 3"'$  (1.6 m). ?  Weight as of this encounter: 93.4 kg. ? ? ?DVT Prophylaxis: Initiate subcutaneous heparin ?  Code Status: DNR ?Family Communication: No family at bedside ?Disposition Plan: To be determined ? ?Status is: Inpatient ?Remains inpatient appropriate because: Cellulitis osteomyelitis left  foot ? ? ? ? ?Medications: Scheduled: ? sodium chloride   Intravenous Once  ? sodium chloride   Intravenous Once  ? atorvastatin  40 mg Oral Daily  ? Chlorhexidine Gluconate Cloth  6 each Topical Q0600  ? insulin aspart  0-6 Units Subcutaneous Q4H  ? insulin glargine-yfgn  10 Units Subcutaneous QHS  ? [START ON 04/01/2022] scopolamine  1 patch Transdermal Q72H  ? ?Continuous: ? piperacillin-tazobactam (ZOSYN)  IV Stopped (03/31/22 0617)  ? vancomycin 1,000 mg (03/30/22 1458)  ? ?OQH:[id number] **OR** acetaminophen, LORazepam, oxyCODONE ? ?Antibiotics: ?Anti-infectives (From admission, onward)  ? ? Start     Dose/Rate Route Frequency Ordered Stop  ? 03/30/22 1200  vancomycin (VANCOCIN) IVPB 1000 mg/200 mL premix       ? 1,000 mg ?200 mL/hr over 60 Minutes Intravenous Every M-W-F (Hemodialysis) 03/30/22 0940    ? 03/30/22 0600  piperacillin-tazobactam (ZOSYN) IVPB 2.25 g       ? 2.25 g ?100 mL/hr over 30 Minutes Intravenous Every 6 hours 03/29/22 2124    ? 03/29/22 2130  vancomycin (VANCOREADY) IVPB 2000 mg/400 mL       ? 2,000 mg ?200 mL/hr over 120 Minutes Intravenous  Once 03/29/22 2103 03/30/22 0139  ? 03/29/22 2119  vancomycin variable dose per unstable renal function (pharmacist dosing)  Status:  Discontinued       ?  Does not apply See admin instructions 03/29/22 2124 03/30/22 0940  ? 03/29/22 2115  piperacillin-tazobactam (ZOSYN) IVPB 3.375 g       ? 3.375 g ?100 mL/hr over 30 Minutes Intravenous  Once 03/29/22 2103 03/29/22 2240  ? ?  ? ? ?Objective: ? ?Vital Signs ? ?Vitals:  ? 03/30/22 2008 03/30/22 2341 03/31/22 0321 03/31/22 0825  ?BP: (!) 114/94 (!) 150/65 121/86 120/60  ?Pulse: 96 95 91 89  ?Resp: '17 15 16 20  '$ ?Temp: 98.6 ?F (37 ?C) 100.1 ?F (37.8 ?C) 98.9 ?F (37.2 ?C) 98.8 ?F (37.1 ?C)  ?TempSrc: Oral Oral Oral Oral  ?SpO2: 99% 98% 96% 94%  ?Weight:      ?Height:      ? ? ?Intake/Output Summary (Last 24 hours) at 03/31/2022 1157 ?Last data filed at 03/31/2022 6812 ?Gross per 24 hour  ?Intake 1023.7 ml   ?Output 3333 ml  ?Net -2309.3 ml  ? ?Filed Weights  ? 03/29/22 1821  ?Weight: 93.4 kg  ? ? ?General appearance: Awake alert.  In no distress.  Distracted ?Resp: Clear to auscultation bilaterally.  Normal effort ?Cardio: S1-S2 is normal regular.  No S3-S4.  No rubs murmurs or bruit ?GI: Abdomen is soft.  Nontender nondistended.  Bowel sounds are present normal.  No masses organomegaly ?Extremities: Mild edema bilateral lower extremity.  Left foot covered in dressing. ?Neurologic: No focal neurological deficits.  ? ? ? ?Lab Results: ? ?Data Reviewed: I have personally reviewed labs and imaging study reports ? ?CBC: ?Recent Labs  ?Lab 03/29/22 ?1839 03/30/22 ?0330 03/31/22 ?7517  ?WBC 27.0* 23.7* 24.7*  ?NEUTROABS 23.2*  --   --   ?HGB 7.5* 6.2* 9.2*  ?HCT 25.2* 20.5* 28.1*  ?MCV 90.0 88.7 86.7  ?PLT 422* 372 355  ? ? ? ?Basic Metabolic Panel: ?Recent Labs  ?Lab 03/29/22 ?1839 03/30/22 ?0330 03/30/22 ?0825 03/31/22 ?0017  ?NA 128* 127*  --  133*  ?K 2.8* 2.5*  --  3.7  ?CL 87* 90*  --  97*  ?CO2 28 29  --  26  ?GLUCOSE 409* 249*  --  240*  ?BUN 17 19  --  16  ?CREATININE 3.95* 4.15*  --  3.22*  ?CALCIUM 8.4* 7.9*  --  7.9*  ?MG  --  2.1  --   --   ?PHOS  --   --  2.7  --   ? ? ? ?GFR: ?Estimated Creatinine Clearance: 19.9 mL/min (A) (by C-G formula based on SCr of 3.22 mg/dL (H)). ? ?Liver Function Tests: ?Recent Labs  ?Lab 03/29/22 ?1839  ?AST 13*  ?ALT 14  ?ALKPHOS 86  ?BILITOT 0.3  ?PROT 7.8  ?ALBUMIN 2.1*  ? ? ? ?Coagulation Profile: ?Recent Labs  ?Lab 03/29/22 ?1839  ?INR 1.2  ? ? ? ? ?CBG: ?Recent Labs  ?Lab 03/30/22 ?1642 03/30/22 ?2030 03/30/22 ?2338 03/31/22 ?0320 03/31/22 ?9678  ?GLUCAP 189* 357* 260* 207* 228*  ? ? ? ? ?Recent Results (from the past 240 hour(s))  ?Culture, blood (Routine x 2)     Status: None (Preliminary result)  ? Collection Time: 03/29/22  6:39 PM  ? Specimen: BLOOD  ?Result Value Ref Range Status  ? Specimen Description BLOOD LEFT ANTECUBITAL  Final  ? Special Requests   Final  ?   BOTTLES DRAWN AEROBIC ONLY Blood Culture adequate volume  ? Culture   Final  ?  NO GROWTH 2 DAYS ?Performed at [facility], [place] [address] [place],  [address] ?  ? Report Status PENDING  Incomp

## 2022-03-31 NOTE — Progress Notes (Signed)
[facility] ?Progress Note ? ?Subjective: 3 L off w/ HD yesterday, no sig BP drops on HD. Pt w/o new c/o's.  ? ?Vitals:  ? 03/30/22 1530 03/30/22 2008 03/30/22 2341 03/31/22 0321  ?BP: 126/61 (!) 114/94 (!) 150/65 121/86  ?Pulse: 92 96 95 91  ?Resp: '19 17 15 16  '$ ?Temp:  98.6 ?F (37 ?C) 100.1 ?F (37.8 ?C) 98.9 ?F (37.2 ?C)  ?TempSrc:  Oral Oral Oral  ?SpO2:  99% 98% 96%  ?Weight:      ?Height:      ? ? ?Exam: ?Gen alert, looks chronically ill, obese, HOH ?No jvd or bruits ?Chest clear bilat to bases ?RRR no RG ?Abd soft obese, ntnd no mass or ascites +bs ?Ext bilat mild pitting edema L > R leg; L foot wrapped ?Neuro is alert, Ox 3 , nf ?   RIJ TDC/ RUA AVF (maturing, +bruit) ?  ?  ?  ?  ? Home meds include - asa, lipitor, phoslo 1 ac, lantus and lispro insulin, norvasc 10, coreg 18.75 bid, risperdal 3 mg hs, prns/ vits/ supps ?  ?  ? OP HD: [facility] MWF ?  4h 74mn  400/1.5   95.5kg  2/2 bath  TDC (RUA AVF healing) Hep 2500 ? - last Hb 6.9 on 4/12 ? - mircera 200 q2, last 4/12, due 4/26 ? - venofer 100 x 10, 4 left ? - compliance= A, 2-4 kg ave uf ?  ?  ?Assessment/ Plan: ?L foot gangrene/ wounds/ osteo - vanc/ zosyn, per pmd.  ?ESRD - on HD MWF. Had HD here yesterday. Next HD Monday.  ?N/V - x 4 days ?Hypokalemia - 2.7 K+ in ED, better today 3.7.  Will give 30 meq today.  ?Vol/ HTN - home norvasc/ coreg on hold, BP's controlled. 3L UF yest, slightly under dry wt now.  ?Anemia ckd - Hb down here 6.8 > SP 2u prbc's 4/14, Hb up low 9s. Getting IV fe and ESA at op unit. Next esa due 4/26. Will hold IV Fe load w/ acute infection. ?MBD ckd - CCa in range, phos is low. Hold binder for now.  ?DM2 - on insulin, per pmd ?  ?  ?  ? ? ? ? ?Rob SDoctor, hospital?03/31/2022, 6:28 AM ? ? ?Recent Labs  ?Lab 03/29/22 ?1839 03/30/22 ?0330 03/30/22 ?0825 03/31/22 ?01443 ?HGB 7.5* 6.2*  --  9.2*  ?ALBUMIN 2.1*  --   --   --   ?CALCIUM 8.4* 7.9*  --  7.9*  ?PHOS  --   --  2.7  --   ?CREATININE 3.95* 4.15*  --  3.22*  ?K 2.8* 2.5*  --   3.7  ? ?Inpatient medications: ? sodium chloride   Intravenous Once  ? sodium chloride   Intravenous Once  ? atorvastatin  40 mg Oral Daily  ? calcium acetate  667 mg Oral TID WC  ? Chlorhexidine Gluconate Cloth  6 each Topical Q0600  ? insulin aspart  0-6 Units Subcutaneous Q4H  ? insulin glargine-yfgn  10 Units Subcutaneous QHS  ? [START ON 04/01/2022] scopolamine  1 patch Transdermal Q72H  ? ? piperacillin-tazobactam (ZOSYN)  IV 2.25 g (03/31/22 0547)  ? vancomycin 1,000 mg (03/30/22 1458)  ? ?acetaminophen **OR** acetaminophen, LORazepam, oxyCODONE ? ? ? ? ? ? ?

## 2022-03-31 NOTE — Plan of Care (Signed)
  Problem: Clinical Measurements: Goal: Respiratory complications will improve Outcome: Progressing Goal: Cardiovascular complication will be avoided Outcome: Progressing   Problem: Nutrition: Goal: Adequate nutrition will be maintained Outcome: Progressing   

## 2022-03-31 NOTE — Progress Notes (Signed)
Pharmacy Antibiotic Note ? ?Nancy [name] is a 61 y.o. female admitted on 03/29/2022 with  osteomyelitis .  Pharmacy has been consulted for vancomycin and zosyn dosing. ? ?WBC elevated, SCr elevated. ? ?Pt is on a MWF HD schedule. Not a revascular candidate. Will need BKA. We are going to change zosyn to q8.   ? ?Plan: ?-Change zosyn 2.25g IV q8 ?-Vancomycin 1g IV MWF ?-Monitor CBC, renal fx, cultures and clinical progress ? ?Height: '5\' 3"'$  (160 cm) ?Weight: 93.4 kg (205 lb 14.6 oz) ?IBW/kg (Calculated) : 52.4 ? ?Temp (24hrs), Avg:99.1 ?F (37.3 ?C), Min:98.6 ?F (37 ?C), Max:100.1 ?F (37.8 ?C) ? ?Recent Labs  ?Lab 03/29/22 ?1839 03/30/22 ?0330 03/31/22 ?8185  ?WBC 27.0* 23.7* 24.7*  ?CREATININE 3.95* 4.15* 3.22*  ?LATICACIDVEN 2.5*  --   --   ? ?  ?Estimated Creatinine Clearance: 19.9 mL/min (A) (by C-G formula based on SCr of 3.22 mg/dL (H)).   ? ?Allergies  ?Allergen Reactions  ? Hydralazine Hcl Other (See Comments)  ?  Hair loss  ? Hydrocodone Itching and Other (See Comments)  ?  Upset stomach  ? Metformin And Related Nausea And Vomiting and Other (See Comments)  ?  Stomach pains, also  ? Other Nausea Only and Other (See Comments)  ?  Lettuce- Does not digest this!!  ? Plaquenil [Hydroxychloroquine Sulfate] Hives  ? Shellfish-Derived Products Nausea Only and Other (See Comments)  ?  Caused an upset stomach  ? Shrimp (Diagnostic) Nausea Only and Other (See Comments)  ?  Upset stomach   ? Sulfa Antibiotics Hives  ? ? ?Antimicrobials this admission: ?Zosyn 4/13 >>  ?Vancomycin 4/13 >>  ? ?Dose adjustments this admission: ? ? ?Microbiology results: ?4/13 BCx: ngtd ? ?Onnie Boer, PharmD, BCIDP, AAHIVP, CPP ?Infectious Disease Pharmacist ?03/31/2022 3:06 PM ? ? ? ? ?

## 2022-04-01 DIAGNOSIS — M869 Osteomyelitis, unspecified: Secondary | ICD-10-CM | POA: Diagnosis not present

## 2022-04-01 DIAGNOSIS — E119 Type 2 diabetes mellitus without complications: Secondary | ICD-10-CM | POA: Diagnosis not present

## 2022-04-01 DIAGNOSIS — E11621 Type 2 diabetes mellitus with foot ulcer: Secondary | ICD-10-CM | POA: Diagnosis not present

## 2022-04-01 DIAGNOSIS — D649 Anemia, unspecified: Secondary | ICD-10-CM | POA: Diagnosis not present

## 2022-04-01 LAB — CBC
HCT: 30.1 % — ABNORMAL LOW (ref 36.0–46.0)
Hemoglobin: 9.4 g/dL — ABNORMAL LOW (ref 12.0–15.0)
MCH: 27.5 pg (ref 26.0–34.0)
MCHC: 31.2 g/dL (ref 30.0–36.0)
MCV: 88 fL (ref 80.0–100.0)
Platelets: 382 10*3/uL (ref 150–400)
RBC: 3.42 MIL/uL — ABNORMAL LOW (ref 3.87–5.11)
RDW: 16.4 % — ABNORMAL HIGH (ref 11.5–15.5)
WBC: 23.6 10*3/uL — ABNORMAL HIGH (ref 4.0–10.5)
nRBC: 0 % (ref 0.0–0.2)

## 2022-04-01 LAB — GLUCOSE, CAPILLARY
Glucose-Capillary: 102 mg/dL — ABNORMAL HIGH (ref 70–99)
Glucose-Capillary: 114 mg/dL — ABNORMAL HIGH (ref 70–99)
Glucose-Capillary: 123 mg/dL — ABNORMAL HIGH (ref 70–99)
Glucose-Capillary: 123 mg/dL — ABNORMAL HIGH (ref 70–99)
Glucose-Capillary: 139 mg/dL — ABNORMAL HIGH (ref 70–99)
Glucose-Capillary: 151 mg/dL — ABNORMAL HIGH (ref 70–99)

## 2022-04-01 MED ORDER — HYDROMORPHONE HCL 1 MG/ML IJ SOLN
0.5000 mg | INTRAMUSCULAR | Status: DC | PRN
  Administered 2022-04-01 – 2022-04-04 (×7): 0.5 mg via INTRAVENOUS
  Filled 2022-04-01 (×8): qty 0.5

## 2022-04-01 NOTE — Progress Notes (Signed)
? ?[facility] ?PROGRESS NOTE ? ? ?Nancy [name] TKP:[id number] DOB: December 11, 1961 DOA: 03/29/2022  3 ?DOS: the patient was seen and examined on 04/01/2022 ? ?PCP: [name], MD ? ?Brief History and Hospital Course:  ?61 y.o. 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  Patient has chronic wounds of her left foot and heel for which she has previously refused amputation.  She was sent to the ED by wound care as her wounds appeared infected.  She was admitted in September last year and given antibiotics and discharged.  At that time, MRI did not show evidence of osteomyelitis.  ABIs revealed moderate bilateral lower extremity arterial disease.  Amputation was recommended but patient refused.  Admitted again in November with x-rays showing signs of osteomyelitis, amputation was again recommended but patient refused.  X-ray of the foot showed extensive soft tissue swelling with findings of osteomyelitis in the calcaneus.  Patient was hospitalized for further management.  Patient noted to be a poor historian. ? ?Consultants: Vascular surgery.  Nephrology.  Discussed with Dr. Sharol Given ? ?Procedures: None yet ? ? ? ?Subjective: ?Complains of 8 out of 10 pain in the left foot.  No shortness of breath or chest pain.  No nausea or vomiting. ? ? ? ?Assessment/Plan: ? ?* Diabetic foot ulcers ([place]) ?Gangrenous ulcers of both heels with cellulitis and osteomyelitis of left calcaneus ?PAD ? ?X-ray showing extensive soft tissue infection with findings of osteomyelitis progressed in the calcaneus. ?Patient remains on vancomycin and Zosyn. ?Due to concern for peripheral artery disease vascular surgery was consulted.  They did not have any vascular interventions to offer since they feel that the left foot is nonsalvageable. ?Dr. Sharol Given was consulted.   He feels that patient needs bilateral below-knee amputations due to clinical findings. ?Patient and family not sure how to proceed.  They want to discuss further with the surgeon. ?Adjust pain medications for better control. ?WBC remains elevated.  She is afebrile.  Follow-up on cultures. ? ?Nausea & vomiting ?Likely due to constipation.  Abdominal film was unremarkable.  Abdomen is benign on examination.  Diabetic gastroparesis is also possibility.  Continue to monitor for now.  Scopolamine patch was ordered.   ?No further episodes of nausea and vomiting noted. ? ?ESRD on dialysis [facility]) ?Dialyzed on Monday Wednesday Friday schedule.  Nephrology following.   ? ?Hypokalemia ?QT prolongation ? ?Improved with supplementation. ? ?Chronic diastolic CHF (congestive heart failure) ([place]) ?Echo done October 2022 showing LVEF 50 to 55% and grade 2 diastolic dysfunction.  No significant volume overload. ?-Volume management per dialysis ? ?Insulin dependent type 2 diabetes mellitus ([place]) ?Presented with a glucose level greater than 400.  HbA1c 6.3 in March. ?Currently on glargine and SSI.   ?Glargine dose was adjusted yesterday for poorly controlled glucose levels.  Noted to be much better today.  Continue current dosage for now.   ?Please note that we may have to significantly decrease her basal insulin dose if she is made NPO. ? ?CAD (coronary artery disease) ?Cardiac status is stable. ? ?Chronic anemia ?Likely due to kidney disease.  ?Hemoglobin did drop from 7.5-6.2.  She was transfused 2 units of PRBC with dialysis.   ?Hemoglobin has responded and noted to be stable.   ?No overt bleeding noted. ? ?HTN (hypertension) ?Carvedilol and amlodipine mentioned on her home medication list.  However there is a note  stating that these were discontinued by the dialysis clinic recently.   ?Blood pressure reasonably well controlled.  We will continue to monitor off of antihypertensives. ? ? ?Obesity ?Estimated body mass index is 36.48  kg/m? as calculated from the following: ?  Height as of this encounter: '5\' 3"'$  (1.6 m). ?  Weight as of this encounter: 93.4 kg. ? ? ?DVT Prophylaxis: Subcutaneous heparin ?Code Status: DNR ?Family Communication: No family at bedside ?Disposition Plan: To be determined ? ?Status is: Inpatient ?Remains inpatient appropriate because: Cellulitis osteomyelitis left foot ? ? ? ? ?Medications: Scheduled: ? sodium chloride   Intravenous Once  ? sodium chloride   Intravenous Once  ? atorvastatin  40 mg Oral Daily  ? Chlorhexidine Gluconate Cloth  6 each Topical Q0600  ? heparin injection (subcutaneous)  5,000 Units Subcutaneous Q8H  ? insulin aspart  0-6 Units Subcutaneous Q4H  ? insulin glargine-yfgn  14 Units Subcutaneous QHS  ? scopolamine  1 patch Transdermal Q72H  ? ?Continuous: ? piperacillin-tazobactam (ZOSYN)  IV 2.25 g (04/01/22 0630)  ? vancomycin 1,000 mg (03/30/22 1458)  ? ?[id number] **OR** acetaminophen, HYDROmorphone (DILAUDID) injection, LORazepam, oxyCODONE ? ?Antibiotics: ?Anti-infectives (From admission, onward)  ? ? Start     Dose/Rate Route Frequency Ordered Stop  ? 03/31/22 2200  piperacillin-tazobactam (ZOSYN) IVPB 2.25 g       ? 2.25 g ?100 mL/hr over 30 Minutes Intravenous Every 8 hours 03/31/22 1507    ? 03/30/22 1200  vancomycin (VANCOCIN) IVPB 1000 mg/200 mL premix       ? 1,000 mg ?200 mL/hr over 60 Minutes Intravenous Every M-W-F (Hemodialysis) 03/30/22 0940    ? 03/30/22 0600  piperacillin-tazobactam (ZOSYN) IVPB 2.25 g  Status:  Discontinued       ? 2.25 g ?100 mL/hr over 30 Minutes Intravenous Every 6 hours 03/29/22 2124 03/31/22 1507  ? 03/29/22 2130  vancomycin (VANCOREADY) IVPB 2000 mg/400 mL       ? 2,000 mg ?200 mL/hr over 120 Minutes Intravenous  Once 03/29/22 2103 03/30/22 0139  ? 03/29/22 2119  vancomycin variable dose per unstable renal function (pharmacist dosing)  Status:  Discontinued       ?  Does not apply See admin instructions 03/29/22 2124 03/30/22 0940  ? 03/29/22  2115  piperacillin-tazobactam (ZOSYN) IVPB 3.375 g       ? 3.375 g ?100 mL/hr over 30 Minutes Intravenous  Once 03/29/22 2103 03/29/22 2240  ? ?  ? ? ?Objective: ? ?Vital Signs ? ?Vitals:  ? 03/31/22 1246 03/31/22 2007 03/31/22 2332 04/01/22 0350  ?BP: (!) 141/66 (!) 155/77 130/69 134/77  ?Pulse: 81 85 84 83  ?Resp: '18 20 18 20  '$ ?Temp: 99.2 ?F (37.3 ?C) 98.1 ?F (36.7 ?C) 99 ?F (37.2 ?C) 98.8 ?F (37.1 ?C)  ?TempSrc: Oral Oral Oral Oral  ?SpO2: 96% 98% 92% 100%  ?Weight:      ?Height:      ? ? ?Intake/Output Summary (Last 24 hours) at 04/01/2022 0954 ?Last data filed at 04/01/2022 4193 ?Gross per 24 hour  ?Intake 560 ml  ?Output 0 ml  ?Net 560 ml  ? ? ?Filed Weights  ? 03/29/22 1821  ?Weight: 93.4 kg  ? ? ?General appearance: Awake alert.  In no distress.  Distracted ?Resp: Clear to auscultation bilaterally.  Normal effort ?Cardio: S1-S2 is normal regular.  No S3-S4.  No rubs murmurs or bruit ?GI: Abdomen is soft.  Nontender nondistended.  Bowel sounds are present normal.  No masses  organomegaly ?Extremities: Both feet covered in dressing ?Neurologic:   No focal neurological deficits.  ? ? ? ? ?Lab Results: ? ?Data Reviewed: I have personally reviewed labs and imaging study reports ? ?CBC: ?Recent Labs  ?Lab 03/29/22 ?1839 03/30/22 ?0330 03/31/22 ?8338 04/01/22 ?0337  ?WBC 27.0* 23.7* 24.7* 23.6*  ?NEUTROABS 23.2*  --   --   --   ?HGB 7.5* 6.2* 9.2* 9.4*  ?HCT 25.2* 20.5* 28.1* 30.1*  ?MCV 90.0 88.7 86.7 88.0  ?PLT 422* 372 355 382  ? ? ? ?Basic Metabolic Panel: ?Recent Labs  ?Lab 03/29/22 ?1839 03/30/22 ?0330 03/30/22 ?0825 03/31/22 ?2505  ?NA 128* 127*  --  133*  ?K 2.8* 2.5*  --  3.7  ?CL 87* 90*  --  97*  ?CO2 28 29  --  26  ?GLUCOSE 409* 249*  --  240*  ?BUN 17 19  --  16  ?CREATININE 3.95* 4.15*  --  3.22*  ?CALCIUM 8.4* 7.9*  --  7.9*  ?MG  --  2.1  --   --   ?PHOS  --   --  2.7  --   ? ? ? ?GFR: ?Estimated Creatinine Clearance: 19.9 mL/min (A) (by C-G formula based on SCr of 3.22 mg/dL (H)). ? ?Liver Function  Tests: ?Recent Labs  ?Lab 03/29/22 ?1839  ?AST 13*  ?ALT 14  ?ALKPHOS 86  ?BILITOT 0.3  ?PROT 7.8  ?ALBUMIN 2.1*  ? ? ? ?Coagulation Profile: ?Recent Labs  ?Lab 03/29/22 ?1839  ?INR 1.2  ? ? ? ? ?CBG: ?Re

## 2022-04-01 NOTE — Progress Notes (Signed)
[facility] ?Progress Note ? ?Subjective: no new c/o. Seen by ortho, needs bilat BKA.  ? ?Vitals:  ? 03/31/22 1246 03/31/22 2007 03/31/22 2332 04/01/22 0350  ?BP: (!) 141/66 (!) 155/77 130/69 134/77  ?Pulse: 81 85 84 83  ?Resp: '18 20 18 20  '$ ?Temp: 99.2 ?F (37.3 ?C) 98.1 ?F (36.7 ?C) 99 ?F (37.2 ?C) 98.8 ?F (37.1 ?C)  ?TempSrc: Oral Oral Oral Oral  ?SpO2: 96% 98% 92% 100%  ?Weight:      ?Height:      ? ? ?Exam: ?Gen alert, looks chronically ill, obese, very HOH ?No jvd or bruits ?Chest clear bilat to bases ?RRR no RG ?Abd soft obese, ntnd no mass or ascites +bs ?Ext bilat mild pitting edema L > R leg; L foot wrapped ?Neuro is alert, Ox 3 , nf ?   RIJ TDC/ RUA AVF (maturing, +bruit) ?  ?  ?  ?  ? Home meds include - asa, lipitor, phoslo 1 ac, lantus and lispro insulin, norvasc 10, coreg 18.75 bid, risperdal 3 mg hs, prns/ vits/ supps ?  ?  ? OP HD: [facility] MWF ?  4h 81mn  400/1.5   95.5kg  2/2 bath  TDC (RUA AVF healing) Hep 2500 ? - last Hb 6.9 on 4/12 ? - mircera 200 q2, last 4/12, due 4/26 ? - venofer 100 x 10, 4 left ? - compliance= A, 2-4 kg ave uf ?  ?  ?Assessment/ Plan: ?B foot/ heel ulcers - w/ severe pvd. No revasc candidate. Per ortho will need bilat BKA. IV vanc/ zosyn per pmd.  ?ESRD - on HD MWF. Had HD here Friday. Next HD Monday.  ?Vol/ HTN - home norvasc/ coreg on hold, BP's controlled. 3L UF on HD Sat, slightly under dry wt. Still has some edema, prob lower dry wt further.  ?Anemia ckd - Hb down here 6.8 > SP 2u prbc's 4/14, Hb up low 9s. Getting IV fe and ESA at op unit. Next esa due 4/26. Will hold IV Fe load w/ acute infection. ?MBD ckd - CCa in range, phos is low. Hold binder for now.  ?DM2 - on insulin, per pmd ?  ?  ?  ? ? ? ? ?Rob SDoctor, hospital?04/01/2022, 7:05 AM ? ? ?Recent Labs  ?Lab 03/29/22 ?1839 03/30/22 ?0330 03/30/22 ?0825 03/31/22 ?0169604/16/23 ?0337  ?HGB 7.5* 6.2*  --  9.2* 9.4*  ?ALBUMIN 2.1*  --   --   --   --   ?CALCIUM 8.4* 7.9*  --  7.9*  --   ?PHOS  --   --  2.7  --   --    ?CREATININE 3.95* 4.15*  --  3.22*  --   ?K 2.8* 2.5*  --  3.7  --   ? ? ?Inpatient medications: ? sodium chloride   Intravenous Once  ? sodium chloride   Intravenous Once  ? atorvastatin  40 mg Oral Daily  ? Chlorhexidine Gluconate Cloth  6 each Topical Q0600  ? heparin injection (subcutaneous)  5,000 Units Subcutaneous Q8H  ? insulin aspart  0-6 Units Subcutaneous Q4H  ? insulin glargine-yfgn  14 Units Subcutaneous QHS  ? scopolamine  1 patch Transdermal Q72H  ? ? piperacillin-tazobactam (ZOSYN)  IV 2.25 g (04/01/22 0630)  ? vancomycin 1,000 mg (03/30/22 1458)  ? ?acetaminophen **OR** acetaminophen, LORazepam, oxyCODONE ? ? ? ? ? ? ?

## 2022-04-01 NOTE — Progress Notes (Signed)
?  Transition of Care (TOC) Screening Note ? ? ?Patient Details  ?Name: Nancy [name] ?Date of Birth: 26-Oct-1961 ? ? ?Transition of Care (TOC) CM/SW Contact:    ?Bary Castilla, LCSW ?Phone Number: ?04/01/2022, 11:00 AM ? ? ? ?Transition of Care Department [facility]) has reviewed patient and no TOC needs have been identified at this time. We will continue to monitor patient advancement through interdisciplinary progression rounds. If new patient transition needs arise, please place a TOC consult. ?  ?

## 2022-04-01 NOTE — Consult Note (Signed)
WOC Nurse Consult Note: ?Consult to [place] is made simultaneously to consults for both Vascular Surgery and Orthopedic Surgery. See notes from both [place] and Dr Sharol Given. ? ?[place] will defer to the expertise of those specialists on a POC. ? ?Discussed with Dr. [place] Pink via [place]. ? ?[place] nursing team will not follow, but will remain available to this patient, the nursing and medical teams.  Please re-consult if needed. ?Thanks, ?Maudie Flakes, MSN, RN, [place], [place], CWON-AP, [place]  ?Pager# [phone]  ? ? ?  ?

## 2022-04-02 DIAGNOSIS — I2510 Atherosclerotic heart disease of native coronary artery without angina pectoris: Secondary | ICD-10-CM

## 2022-04-02 DIAGNOSIS — I5032 Chronic diastolic (congestive) heart failure: Secondary | ICD-10-CM | POA: Diagnosis not present

## 2022-04-02 DIAGNOSIS — E876 Hypokalemia: Secondary | ICD-10-CM

## 2022-04-02 DIAGNOSIS — E11621 Type 2 diabetes mellitus with foot ulcer: Secondary | ICD-10-CM | POA: Diagnosis not present

## 2022-04-02 DIAGNOSIS — D649 Anemia, unspecified: Secondary | ICD-10-CM | POA: Diagnosis not present

## 2022-04-02 DIAGNOSIS — R112 Nausea with vomiting, unspecified: Secondary | ICD-10-CM

## 2022-04-02 DIAGNOSIS — L97529 Non-pressure chronic ulcer of other part of left foot with unspecified severity: Secondary | ICD-10-CM | POA: Diagnosis not present

## 2022-04-02 LAB — GLUCOSE, CAPILLARY
Glucose-Capillary: 101 mg/dL — ABNORMAL HIGH (ref 70–99)
Glucose-Capillary: 162 mg/dL — ABNORMAL HIGH (ref 70–99)
Glucose-Capillary: 172 mg/dL — ABNORMAL HIGH (ref 70–99)
Glucose-Capillary: 97 mg/dL (ref 70–99)
Glucose-Capillary: 99 mg/dL (ref 70–99)

## 2022-04-02 LAB — CBC
HCT: 29.2 % — ABNORMAL LOW (ref 36.0–46.0)
Hemoglobin: 9.2 g/dL — ABNORMAL LOW (ref 12.0–15.0)
MCH: 27.7 pg (ref 26.0–34.0)
MCHC: 31.5 g/dL (ref 30.0–36.0)
MCV: 88 fL (ref 80.0–100.0)
Platelets: 331 10*3/uL (ref 150–400)
RBC: 3.32 MIL/uL — ABNORMAL LOW (ref 3.87–5.11)
RDW: 16.1 % — ABNORMAL HIGH (ref 11.5–15.5)
WBC: 20.8 10*3/uL — ABNORMAL HIGH (ref 4.0–10.5)
nRBC: 0 % (ref 0.0–0.2)

## 2022-04-02 MED ORDER — ALTEPLASE 2 MG IJ SOLR
INTRAMUSCULAR | Status: AC
  Filled 2022-04-02: qty 4

## 2022-04-02 MED ORDER — DIPHENHYDRAMINE HCL 25 MG PO CAPS
25.0000 mg | ORAL_CAPSULE | Freq: Three times a day (TID) | ORAL | Status: DC | PRN
  Administered 2022-04-02 – 2022-04-09 (×7): 25 mg via ORAL
  Filled 2022-04-02 (×7): qty 1

## 2022-04-02 NOTE — Progress Notes (Signed)
[facility] ?Progress Note ? ?Subjective: no new c/o.  ? ?Vitals:  ? 04/01/22 1117 04/01/22 2015 04/01/22 2338 04/02/22 0411  ?BP: (!) 103/54 (!) 141/75 (!) 144/61 (!) 149/72  ?Pulse: 82 82 75 90  ?Resp: '17 16 16 17  '$ ?Temp: 98.2 ?F (36.8 ?C) 98 ?F (36.7 ?C) 97.7 ?F (36.5 ?C) 98.1 ?F (36.7 ?C)  ?TempSrc: Oral Oral Oral Oral  ?SpO2: 97% 99% 100% 99%  ?Weight:      ?Height:      ? ? ?Exam: ?Gen alert, looks chronically ill, obese, very HOH ?No jvd or bruits ?Chest clear bilat to bases ?RRR no RG ?Abd soft obese, ntnd no mass or ascites +bs ?Ext bilat mild pitting edema L > R leg; L foot wrapped ?Neuro is alert, Ox 3 , nf ?   RIJ TDC/ RUA AVF (maturing, +bruit) ?  ?  ?  ?  ? Home meds include - asa, lipitor, phoslo 1 ac, lantus and lispro insulin, norvasc 10, coreg 18.75 bid, risperdal 3 mg hs, prns/ vits/ supps ?  ?  ? OP HD: [facility] MWF ?  4h 25mn  400/1.5   95.5kg  2/2 bath  TDC (RUA AVF healing) Hep 2500 ? - last Hb 6.9 on 4/12 ? - mircera 200 q2, last 4/12, due 4/26 ? - venofer 100 x 10, 4 left ? - compliance= A, 2-4 kg ave uf ?  ?  ?Assessment/ Plan: ?B foot/ heel ulcers/ osteo - w/ severe pvd. Not a revasc candidate. Per ortho will need bilat BKA. IV vanc/ zosyn per pmd.  ?ESRD - on HD MWF. Had HD here Friday. HD today.  ?Vol/ HTN - home norvasc/ coreg on hold, BP's controlled. 3L UF on HD Sat, slightly under dry wt. Still has some edema, try to lower dry wt further.  ?Anemia ckd - Hb down here 6.8 > SP 2u prbc's 4/14, Hb better 9s. Was getting IV fe and ESA at op unit. Next esa due 4/26. Will hold IV Fe w/ acute infection. ?MBD ckd - CCa in range, phos is low. Hold binder for now.  ?DM2 - on insulin, per pmd ?  ?  ?  ? ? ? ? ?Rob SDoctor, hospital?04/02/2022, 6:13 AM ? ? ?Recent Labs  ?Lab 03/29/22 ?1839 03/30/22 ?0330 03/30/22 ?0825 03/31/22 ?0245804/16/23 ?0337  ?HGB 7.5* 6.2*  --  9.2* 9.4*  ?ALBUMIN 2.1*  --   --   --   --   ?CALCIUM 8.4* 7.9*  --  7.9*  --   ?PHOS  --   --  2.7  --   --   ?CREATININE  3.95* 4.15*  --  3.22*  --   ?K 2.8* 2.5*  --  3.7  --   ? ? ?Inpatient medications: ? sodium chloride   Intravenous Once  ? sodium chloride   Intravenous Once  ? atorvastatin  40 mg Oral Daily  ? Chlorhexidine Gluconate Cloth  6 each Topical Q0600  ? heparin injection (subcutaneous)  5,000 Units Subcutaneous Q8H  ? insulin aspart  0-6 Units Subcutaneous Q4H  ? insulin glargine-yfgn  14 Units Subcutaneous QHS  ? scopolamine  1 patch Transdermal Q72H  ? ? piperacillin-tazobactam (ZOSYN)  IV 2.25 g (04/02/22 0606)  ? vancomycin 1,000 mg (03/30/22 1458)  ? ?acetaminophen **OR** acetaminophen, HYDROmorphone (DILAUDID) injection, oxyCODONE ? ? ? ? ? ? ?

## 2022-04-02 NOTE — Progress Notes (Signed)
?   04/02/22 1938  ?Vitals  ?BP 110/61  ?Pulse Rate 79  ?Resp 18  ?Post-Hemodialysis Assessment  ?Rinseback Volume (mL) 250 mL  ?KECN 180 V  ?Dialyzer Clearance Lightly streaked  ?Duration of HD Treatment -hour(s) 3.5 hour(s)  ?Hemodialysis Intake (mL) 500 mL  ?UF Total -Machine (mL) 3000 mL  ?Net UF (mL) 2500 mL  ?Tolerated HD Treatment Yes  ?Post-Hemodialysis Comments Tx completed, catheter with frequent alarming, Sluggish blood return bilateral lumens, resistance noted on push. Very little improvement on reversal of lines.  BFR reduced to complete tx. No issues with clotting in ECC.  Cathflo administered to lumens to dwell overnight or until next HD tx.  High dose alert sticker placed on catheter. NAD Irregular Heart Rate, asymptomatic. No other concerns at this time.  ?Education / Care Plan  ?Dialysis Education Provided Yes  ?Hemodialysis Catheter Right Internal jugular Double lumen Permanent (Tunneled)  ?Placement Date/Time: 02/15/22 1333   Time Out: Correct patient;Correct site;Correct procedure  Maximum sterile barrier precautions: Hand hygiene;Cap;Mask;Sterile gown;Sterile gloves;Large sterile sheet  Site Prep: Chlorhexidine (preferred)  Local Anes...  ?Site Condition No complications  ?Blue Lumen Status Flushed;Other (Comment)  ?Red Lumen Status Flushed;Other (Comment)  ?Catheter fill solution  ?(Cathflo)  ?Catheter fill volume (Arterial) 1.6 cc  ?Catheter fill volume (Venous) 1.6  ?Dressing Status Antimicrobial disc in place  ?Interventions Dressing reinforced  ?Post treatment catheter status Capped and Clamped  ? ? ?

## 2022-04-02 NOTE — Progress Notes (Signed)
?PROGRESS NOTE ? ? ? ?Nancy [name]  YSA:[id number] DOB: 02/29/1961 DOA: 03/29/2022 ?PCP: [name], MD  ? ? ?Brief Narrative:  ? ?Nancy [name] is a 62 year old female with past medical history significant for DM2, peripheral neuropathy, chronic diastolic congestive heart failure, essential hypertension, hyperlipidemia, PVD, CVA, ESRD on HD MWF, CAD, chronic anemia, depression, obesity who presented to [facility] ED on 4/13 by direction of physician at wound clinic for progressive leg wounds.  Patient with chronic wounds left foot and heel which she has previously refused amputation.  Admitted September 2022, with MRI not showing evidence of osteomyelitis with ABIs revealing moderate bilateral lower extremity arterial disease; amputation was recommended but patient refused and was and given antibiotics and discharged home. ? ?Patient is a poor historian.  Reports chronic wounds of her left foot.  Reports new wound to the top of her foot which is draining pus.  Denies fevers.  Reports a lot of pain in her left foot.  Also reports vomiting for the past 2 days and not eating.  States her last bowel movement was 4 days ago. Denies abdominal pain.  No additional history could be obtained from her. ? ?In the ED, afebrile, with normal heart rate and blood pressure.  WBC count is 27.0, hemoglobin 7.5, platelet count 422.  Sodium 128, potassium 2.8, chloride 87, bicarb 28, BUN 17, creatinine 3.9, glucose 4 9.  Albumin 2.1, lactic acid 2.5.  Blood cultures drawn.  X-ray left foot showing extensive soft tissue infection with findings of osteomyelitis that has progressed to the calcaneus.  Patient was started on vancomycin and Zosyn.  Hospitalist service consulted for further evaluation and management. ? ? ?Assessment & Plan: ? ?Diabetic, gangrenous foot ulcers with cellulitis/osteomyelitis left calcaneus ?Peripheral artery disease ?Patient presenting from a wound care clinic with progressive soft tissue wounds  concerning for infection.  X-ray with extensive soft tissue infection with findings of osteomyelitis that is progressed in the calcaneus.  Was evaluated by vascular surgery and was deemed left foot nonsalvageable.  Orthopedics was consulted, Dr. Sharol Given recommends bilateral BKA. ?--WBC 27.0>>20.8 ?--Continue vancomycin/Zosyn ?--Orthopedics plans bilateral BKA 4/18 ?--CBC daily ? ?Hyponatremia ?Sodium 128 on admission, likely secondary to poor oral intake in the setting of acute on chronic infection/osteomyelitis as above. ?--Na 128>133 ?--Continue monitor renal panel with HD ? ?Hypokalemia ?Repleted on admission. ? ?Nausea/vomiting: Resolved ?Etiology likely secondary to constipation versus diabetic gastroparesis.  Abdominal x-ray unremarkable.  Physical exam benign.  Now resolved. ?--Scopolamine patch q72h ? ?ESRD on HD MWF ?--Nephrology following for continue HD well and patient ? ?Chronic diastolic congestive heart failure ?TTE 09/2021 with LVEF 32-35%, grade 2 diastolic dysfunction.  No significant volume overload. ?--Continue volume management with HD ? ?Type 2 diabetes mellitus ?Hemoglobin A1c 6.16 February 2022, well controlled.  Home regimen includes Lantus 15 units subcu insulin daily, Humalog 4 units before breakfast/dinner. ?--Semglee 14u [place] daily ?--very sensitive SSI for coverage ?--CBGs qAC/HS ? ?Essential hypertension ?Was previously on carvedilol and amlodipine, that was recently discontinued by dialysis clinic. ?--Continue monitor BP off of antihypertensives ? ?Prolonged Qtc ?--Avoid QTc prolonging medications ? ?Morbid obesity ?Body mass index is 36.48 kg/m?Marland Kitchen  Discussed with patient needs for aggressive lifestyle changes/weight loss as this complicates all facets of care.  Outpatient follow-up with PCP.   ? ? ? ? ? ?DVT prophylaxis: heparin injection 5,000 Units Start: 03/31/22 1400 ?SCDs Start: 03/29/22 2145 ? ?  Code Status: DNR ?Family Communication: Updated patient's brother who is present at  bedside  this morning ? ?Disposition Plan:  ?Level of care: Telemetry Cardiac ?Status is: Inpatient ?Remains inpatient appropriate because: IV antibiotics, orthopedics plans bilateral BKA on 04/04/2022 ?  ? ?Consultants:  ?Vascular surgery ?Orthopedics ?Nephrology ? ?Procedures:  ?None ? ?Antimicrobials:  ?Vancomycin 4/13 ?Zosyn 4/13 ? ? ?Subjective: ?Patient seen examined bedside, resting comfortably.  No specific complaints this morning.  Brother present at bedside.  Seen by orthopedics this morning with plan for bilateral BKA on Wednesday.  Remains on IV antibiotics.  No other specific complaints or concerns at this time.  Denies headache, no chest pain, no shortness of breath, no abdominal pain, no current nausea/vomiting, no diarrhea, no palpitations, no weakness, no fatigue, no paresthesias.  No acute events overnight per nurse staff. ? ?Objective: ?Vitals:  ? 04/01/22 2015 04/01/22 2338 04/02/22 0411 04/02/22 0755  ?BP: (!) 141/75 (!) 144/61 (!) 149/72   ?Pulse: 82 75 90   ?Resp: '16 16 17 18  '$ ?Temp: 98 ?F (36.7 ?C) 97.7 ?F (36.5 ?C) 98.1 ?F (36.7 ?C) 97.8 ?F (36.6 ?C)  ?TempSrc: Oral Oral Oral   ?SpO2: 99% 100% 99%   ?Weight:      ?Height:      ? ? ?Intake/Output Summary (Last 24 hours) at 04/02/2022 1020 ?Last data filed at 04/01/2022 1625 ?Gross per 24 hour  ?Intake 150 ml  ?Output --  ?Net 150 ml  ? ?Filed Weights  ? 03/29/22 1821  ?Weight: 93.4 kg  ? ? ?Examination: ? ?Physical Exam: ?GEN: NAD, alert and oriented x 3, chronically ill appearance, appears older than stated age ?HEENT: NCAT, PERRL, EOMI, sclera clear, MMM ?PULM: CTAB w/o wheezes/crackles, normal respiratory effort, on room air ?CV: RRR w/o M/G/R, HD cath noted right upper chest ?GI: abd soft, NTND, NABS, no R/G/M ?MSK: Bilateral lower extremities with dressing in place, see pictures below ?NEURO: CN II-XII intact, no focal deficits, sensation to light touch intact ?PSYCH: normal mood/affect ? ? ? ? ? ? ? ? ?Data Reviewed: I have personally reviewed  following labs and imaging studies ? ?CBC: ?Recent Labs  ?Lab 03/29/22 ?1839 03/30/22 ?0330 03/31/22 ?4562 04/01/22 ?5638 04/02/22 ?9373  ?WBC 27.0* 23.7* 24.7* 23.6* 20.8*  ?NEUTROABS 23.2*  --   --   --   --   ?HGB 7.5* 6.2* 9.2* 9.4* 9.2*  ?HCT 25.2* 20.5* 28.1* 30.1* 29.2*  ?MCV 90.0 88.7 86.7 88.0 88.0  ?PLT 422* 372 355 382 331  ? ?Basic Metabolic Panel: ?Recent Labs  ?Lab 03/29/22 ?1839 03/30/22 ?0330 03/30/22 ?0825 03/31/22 ?4287  ?NA 128* 127*  --  133*  ?K 2.8* 2.5*  --  3.7  ?CL 87* 90*  --  97*  ?CO2 28 29  --  26  ?GLUCOSE 409* 249*  --  240*  ?BUN 17 19  --  16  ?CREATININE 3.95* 4.15*  --  3.22*  ?CALCIUM 8.4* 7.9*  --  7.9*  ?MG  --  2.1  --   --   ?PHOS  --   --  2.7  --   ? ?GFR: ?Estimated Creatinine Clearance: 19.9 mL/min (A) (by C-G formula based on SCr of 3.22 mg/dL (H)). ?Liver Function Tests: ?Recent Labs  ?Lab 03/29/22 ?1839  ?AST 13*  ?ALT 14  ?ALKPHOS 86  ?BILITOT 0.3  ?PROT 7.8  ?ALBUMIN 2.1*  ? ?No results for input(s): LIPASE, AMYLASE in the last 168 hours. ?No results for input(s): AMMONIA in the last 168 hours. ?Coagulation Profile: ?Recent Labs  ?Lab 03/29/22 ?1839  ?  INR 1.2  ? ?Cardiac Enzymes: ?No results for input(s): CKTOTAL, CKMB, CKMBINDEX, TROPONINI in the last 168 hours. ?BNP (last 3 results) ?No results for input(s): PROBNP in the last 8760 hours. ?HbA1C: ?No results for input(s): HGBA1C in the last 72 hours. ?CBG: ?Recent Labs  ?Lab 04/01/22 ?1638 04/01/22 ?2014 04/02/22 ?0005 04/02/22 ?0409 04/02/22 ?3295  ?GLUCAP 139* 114* 101* 97 172*  ? ?Lipid Profile: ?No results for input(s): CHOL, HDL, LDLCALC, TRIG, CHOLHDL, LDLDIRECT in the last 72 hours. ?Thyroid Function Tests: ?No results for input(s): TSH, T4TOTAL, FREET4, T3FREE, THYROIDAB in the last 72 hours. ?Anemia Panel: ?No results for input(s): VITAMINB12, FOLATE, FERRITIN, TIBC, IRON, RETICCTPCT in the last 72 hours. ?Sepsis Labs: ?Recent Labs  ?Lab 03/29/22 ?1839  ?LATICACIDVEN 2.5*  ? ? ?Recent Results (from the past  240 hour(s))  ?Culture, blood (Routine x 2)     Status: None (Preliminary result)  ? Collection Time: 03/29/22  6:39 PM  ? Specimen: BLOOD  ?Result Value Ref Range Status  ? Specimen Description BLOOD LEFT

## 2022-04-02 NOTE — Progress Notes (Signed)
Patient via bed to hemodialysis in stable condition ?

## 2022-04-02 NOTE — H&P (View-Only) (Signed)
Patient ID: Nancy [name], female   DOB: Aug 31, 1961, 61 y.o.   MRN: [id number] ?Patient is a 61 year old woman who is seen in follow-up for necrotic abscesses both feet with chronic ulceration left worse than right.  Patient's brother was at bedside.  Discussed with the patient and her brother recommendation to proceed with bilateral below-knee amputations.  Discussed that there are not conservative foot salvage intervention options available.  Patient and brother state they understand and wish to proceed with bilateral below-knee amputations on Wednesday. ?

## 2022-04-02 NOTE — Plan of Care (Signed)
?  Problem: Clinical Measurements: ?Goal: Respiratory complications will improve ?Outcome: Progressing ?  ?Problem: Nutrition: ?Goal: Adequate nutrition will be maintained ?Outcome: Progressing ?  ?Problem: Elimination: ?Goal: Will not experience complications related to bowel motility ?Outcome: Progressing ?  ?Problem: Pain Managment: ?Goal: General experience of comfort will improve ?Outcome: Not Progressing ?  ?

## 2022-04-02 NOTE — Progress Notes (Signed)
Patient ID: Nancy [name], female   DOB: 1961-05-28, 61 y.o.   MRN: [id number] ?Patient is a 61 year old woman who is seen in follow-up for necrotic abscesses both feet with chronic ulceration left worse than right.  Patient's brother was at bedside.  Discussed with the patient and her brother recommendation to proceed with bilateral below-knee amputations.  Discussed that there are not conservative foot salvage intervention options available.  Patient and brother state they understand and wish to proceed with bilateral below-knee amputations on Wednesday. ?

## 2022-04-03 DIAGNOSIS — I5032 Chronic diastolic (congestive) heart failure: Secondary | ICD-10-CM | POA: Diagnosis not present

## 2022-04-03 DIAGNOSIS — E11621 Type 2 diabetes mellitus with foot ulcer: Secondary | ICD-10-CM | POA: Diagnosis not present

## 2022-04-03 DIAGNOSIS — D649 Anemia, unspecified: Secondary | ICD-10-CM | POA: Diagnosis not present

## 2022-04-03 DIAGNOSIS — I2510 Atherosclerotic heart disease of native coronary artery without angina pectoris: Secondary | ICD-10-CM | POA: Diagnosis not present

## 2022-04-03 LAB — CBC WITH DIFFERENTIAL/PLATELET
Abs Immature Granulocytes: 0.27 10*3/uL — ABNORMAL HIGH (ref 0.00–0.07)
Basophils Absolute: 0.1 10*3/uL (ref 0.0–0.1)
Basophils Relative: 1 %
Eosinophils Absolute: 0.2 10*3/uL (ref 0.0–0.5)
Eosinophils Relative: 1 %
HCT: 28.2 % — ABNORMAL LOW (ref 36.0–46.0)
Hemoglobin: 8.5 g/dL — ABNORMAL LOW (ref 12.0–15.0)
Immature Granulocytes: 2 %
Lymphocytes Relative: 12 %
Lymphs Abs: 2.1 10*3/uL (ref 0.7–4.0)
MCH: 27.3 pg (ref 26.0–34.0)
MCHC: 30.1 g/dL (ref 30.0–36.0)
MCV: 90.7 fL (ref 80.0–100.0)
Monocytes Absolute: 1.5 10*3/uL — ABNORMAL HIGH (ref 0.1–1.0)
Monocytes Relative: 9 %
Neutro Abs: 12.9 10*3/uL — ABNORMAL HIGH (ref 1.7–7.7)
Neutrophils Relative %: 75 %
Platelets: 323 10*3/uL (ref 150–400)
RBC: 3.11 MIL/uL — ABNORMAL LOW (ref 3.87–5.11)
RDW: 16.4 % — ABNORMAL HIGH (ref 11.5–15.5)
WBC: 17 10*3/uL — ABNORMAL HIGH (ref 4.0–10.5)
nRBC: 0 % (ref 0.0–0.2)

## 2022-04-03 LAB — GLUCOSE, CAPILLARY
Glucose-Capillary: 104 mg/dL — ABNORMAL HIGH (ref 70–99)
Glucose-Capillary: 120 mg/dL — ABNORMAL HIGH (ref 70–99)
Glucose-Capillary: 145 mg/dL — ABNORMAL HIGH (ref 70–99)
Glucose-Capillary: 206 mg/dL — ABNORMAL HIGH (ref 70–99)
Glucose-Capillary: 80 mg/dL (ref 70–99)

## 2022-04-03 LAB — RENAL FUNCTION PANEL
Albumin: 1.6 g/dL — ABNORMAL LOW (ref 3.5–5.0)
Anion gap: 10 (ref 5–15)
BUN: 19 mg/dL (ref 8–23)
CO2: 28 mmol/L (ref 22–32)
Calcium: 8.1 mg/dL — ABNORMAL LOW (ref 8.9–10.3)
Chloride: 96 mmol/L — ABNORMAL LOW (ref 98–111)
Creatinine, Ser: 3.88 mg/dL — ABNORMAL HIGH (ref 0.44–1.00)
GFR, Estimated: 13 mL/min — ABNORMAL LOW (ref >60)
Glucose, Bld: 99 mg/dL (ref 70–99)
Phosphorus: 4.3 mg/dL (ref 2.5–4.6)
Potassium: 4 mmol/L (ref 3.5–5.1)
Sodium: 134 mmol/L — ABNORMAL LOW (ref 135–145)

## 2022-04-03 LAB — CULTURE, BLOOD (ROUTINE X 2)
Culture: NO GROWTH
Special Requests: ADEQUATE

## 2022-04-03 LAB — CBC
HCT: 30.4 % — ABNORMAL LOW (ref 36.0–46.0)
Hemoglobin: 9.2 g/dL — ABNORMAL LOW (ref 12.0–15.0)
MCH: 27.1 pg (ref 26.0–34.0)
MCHC: 30.3 g/dL (ref 30.0–36.0)
MCV: 89.7 fL (ref 80.0–100.0)
Platelets: 322 10*3/uL (ref 150–400)
RBC: 3.39 MIL/uL — ABNORMAL LOW (ref 3.87–5.11)
RDW: 16.3 % — ABNORMAL HIGH (ref 11.5–15.5)
WBC: 17.1 10*3/uL — ABNORMAL HIGH (ref 4.0–10.5)
nRBC: 0 % (ref 0.0–0.2)

## 2022-04-03 MED ORDER — HEPARIN SODIUM (PORCINE) 1000 UNIT/ML DIALYSIS
2500.0000 [IU] | Freq: Once | INTRAMUSCULAR | Status: DC
  Filled 2022-04-03 (×2): qty 3

## 2022-04-03 MED ORDER — INSULIN GLARGINE-YFGN 100 UNIT/ML ~~LOC~~ SOLN
8.0000 [IU] | Freq: Every day | SUBCUTANEOUS | Status: DC
  Administered 2022-04-03 – 2022-04-09 (×7): 8 [IU] via SUBCUTANEOUS
  Filled 2022-04-03 (×9): qty 0.08

## 2022-04-03 NOTE — Progress Notes (Addendum)
?PROGRESS NOTE ? ? ? ?Nancy [name]  LSL:[id number] DOB: 1961/03/15 DOA: 03/29/2022 ?PCP: [name], MD  ? ? ?Brief Narrative:  ? ?Nancy [name] is a 61 year old female with past medical history significant for DM2, peripheral neuropathy, chronic diastolic congestive heart failure, essential hypertension, hyperlipidemia, PVD, CVA, ESRD on HD MWF, CAD, chronic anemia, depression, obesity who presented to [facility] ED on 4/13 by direction of physician at wound clinic for progressive leg wounds.  Patient with chronic wounds left foot and heel which she has previously refused amputation.  Admitted September 2022, with MRI not showing evidence of osteomyelitis with ABIs revealing moderate bilateral lower extremity arterial disease; amputation was recommended but patient refused and was and given antibiotics and discharged home. ? ?Patient is a poor historian.  Reports chronic wounds of her left foot.  Reports new wound to the top of her foot which is draining pus.  Denies fevers.  Reports a lot of pain in her left foot.  Also reports vomiting for the past 2 days and not eating.  States her last bowel movement was 4 days ago. Denies abdominal pain.  No additional history could be obtained from her. ? ?In the ED, afebrile, with normal heart rate and blood pressure.  WBC count is 27.0, hemoglobin 7.5, platelet count 422.  Sodium 128, potassium 2.8, chloride 87, bicarb 28, BUN 17, creatinine 3.9, glucose 4 9.  Albumin 2.1, lactic acid 2.5.  Blood cultures drawn.  X-ray left foot showing extensive soft tissue infection with findings of osteomyelitis that has progressed to the calcaneus.  Patient was started on vancomycin and Zosyn.  Hospitalist service consulted for further evaluation and management. ? ? ?Assessment & Plan: ? ?Diabetic, gangrenous foot ulcers with cellulitis/osteomyelitis left calcaneus ?Peripheral artery disease ?Patient presenting from a wound care clinic with progressive soft tissue wounds  concerning for infection.  X-ray with extensive soft tissue infection with findings of osteomyelitis that is progressed in the calcaneus.  Was evaluated by vascular surgery and was deemed left foot nonsalvageable.  Orthopedics was consulted, Dr. Sharol Given recommends bilateral BKA. ?--WBC 27.0>>20.8>17.1 ?--Continue vancomycin/Zosyn ?--Orthopedics plans bilateral BKA tomorrow, n.p.o. after midnight ?--CBC daily ? ?Hyponatremia ?Sodium 128 on admission, likely secondary to poor oral intake in the setting of acute on chronic infection/osteomyelitis as above. ?--Na 128>133 ?--Continue monitor renal panel with HD ? ?Hypokalemia ?Repleted on admission. ? ?Nausea/vomiting: Resolved ?Etiology likely secondary to constipation versus diabetic gastroparesis.  Abdominal x-ray unremarkable.  Physical exam benign.  Now resolved. ?--Scopolamine patch q72h ? ?ESRD on HD MWF ?--Nephrology following for continue HD while inpatient ? ?Chronic diastolic congestive heart failure ?TTE 09/2021 with LVEF 11-57%, grade 2 diastolic dysfunction.  No significant volume overload. ?--Continue volume management with HD ? ?Type 2 diabetes mellitus ?Hemoglobin A1c 6.16 February 2022, well controlled.  Home regimen includes Lantus 15 units subcu insulin daily, Humalog 4 units before breakfast/dinner. ?--Semglee 14u [place] daily>>reduce to 8 units tonight as will be NPO tomorrow for surgery. ?--very sensitive SSI for coverage ?--CBGs qAC/HS ? ?Essential hypertension ?Was previously on carvedilol and amlodipine, that was recently discontinued by dialysis clinic. ?--Continue monitor BP off of antihypertensives ? ?Prolonged Qtc ?--Avoid QTc prolonging medications ? ?Morbid obesity ?Body mass index is 36.48 kg/m?Marland Kitchen  Discussed with patient needs for aggressive lifestyle changes/weight loss as this complicates all facets of care.  Outpatient follow-up with PCP.   ? ? ? ? ? ?DVT prophylaxis: heparin injection 5,000 Units Start: 03/31/22 1400 ?SCDs Start: 03/29/22  2145 ? ?  Code Status: DNR ?Family Communication: No family present at bedside this morning ? ?Disposition Plan:  ?Level of care: Telemetry Cardiac ?Status is: Inpatient ?Remains inpatient appropriate because: IV antibiotics, orthopedics plans bilateral BKA on 04/04/2022 ?  ? ?Consultants:  ?Vascular surgery ?Orthopedics ?Nephrology ? ?Procedures:  ?None ? ?Antimicrobials:  ?Vancomycin 4/13 ?Zosyn 4/13 ? ? ?Subjective: ?Patient seen examined bedside, resting comfortably.  No specific complaints this morning.  No family present.  Asking for pain medication.  Orthopedics planning bilateral BKA tomorrow.  Remains on IV antibiotics.  No other specific complaints or concerns at this time.  Denies headache, no chest pain, no shortness of breath, no abdominal pain, no current nausea/vomiting, no diarrhea, no palpitations, no weakness, no fatigue, no paresthesias.  No acute events overnight per nurse staff. ? ?Objective: ?Vitals:  ? 04/03/22 0427 04/03/22 0745 04/03/22 1017 04/03/22 1031  ?BP: (!) 152/64 (!) 152/69 (!) 131/57 131/61  ?Pulse: 84 80 78 75  ?Resp: '18 16 17 14  '$ ?Temp: 98.6 ?F (37 ?C) 98 ?F (36.7 ?C) 98.7 ?F (37.1 ?C)   ?TempSrc: Oral Oral Temporal   ?SpO2: 99%     ?Weight:      ?Height:      ? ? ?Intake/Output Summary (Last 24 hours) at 04/03/2022 1049 ?Last data filed at 04/02/2022 1938 ?Gross per 24 hour  ?Intake --  ?Output 2500 ml  ?Net -2500 ml  ? ?Filed Weights  ? 03/29/22 1821  ?Weight: 93.4 kg  ? ? ?Examination: ? ?Physical Exam: ?GEN: NAD, alert and oriented x 3, chronically ill appearance, appears older than stated age ?HEENT: NCAT, PERRL, EOMI, sclera clear, MMM ?PULM: CTAB w/o wheezes/crackles, normal respiratory effort, on room air ?CV: RRR w/o M/G/R, HD cath noted right upper chest ?GI: abd soft, NTND, NABS, no R/G/M ?MSK: Bilateral lower extremities with dressing in place, see pictures below ?NEURO: CN II-XII intact, no focal deficits, sensation to light touch intact ?PSYCH: normal  mood/affect ? ? ? ? ? ? ? ? ?Data Reviewed: I have personally reviewed following labs and imaging studies ? ?CBC: ?Recent Labs  ?Lab 03/29/22 ?1839 03/30/22 ?0330 03/31/22 ?1027 04/01/22 ?2536 04/02/22 ?6440 04/03/22 ?0326  ?WBC 27.0* 23.7* 24.7* 23.6* 20.8* 17.1*  ?NEUTROABS 23.2*  --   --   --   --   --   ?HGB 7.5* 6.2* 9.2* 9.4* 9.2* 9.2*  ?HCT 25.2* 20.5* 28.1* 30.1* 29.2* 30.4*  ?MCV 90.0 88.7 86.7 88.0 88.0 89.7  ?PLT 422* 372 355 382 331 322  ? ?Basic Metabolic Panel: ?Recent Labs  ?Lab 03/29/22 ?1839 03/30/22 ?0330 03/30/22 ?0825 03/31/22 ?3474  ?NA 128* 127*  --  133*  ?K 2.8* 2.5*  --  3.7  ?CL 87* 90*  --  97*  ?CO2 28 29  --  26  ?GLUCOSE 409* 249*  --  240*  ?BUN 17 19  --  16  ?CREATININE 3.95* 4.15*  --  3.22*  ?CALCIUM 8.4* 7.9*  --  7.9*  ?MG  --  2.1  --   --   ?PHOS  --   --  2.7  --   ? ?GFR: ?Estimated Creatinine Clearance: 19.9 mL/min (A) (by C-G formula based on SCr of 3.22 mg/dL (H)). ?Liver Function Tests: ?Recent Labs  ?Lab 03/29/22 ?1839  ?AST 13*  ?ALT 14  ?ALKPHOS 86  ?BILITOT 0.3  ?PROT 7.8  ?ALBUMIN 2.1*  ? ?No results for input(s): LIPASE, AMYLASE in the last 168 hours. ?No results for input(s): AMMONIA in  the last 168 hours. ?Coagulation Profile: ?Recent Labs  ?Lab 03/29/22 ?1839  ?INR 1.2  ? ?Cardiac Enzymes: ?No results for input(s): CKTOTAL, CKMB, CKMBINDEX, TROPONINI in the last 168 hours. ?BNP (last 3 results) ?No results for input(s): PROBNP in the last 8760 hours. ?HbA1C: ?No results for input(s): HGBA1C in the last 72 hours. ?CBG: ?Recent Labs  ?Lab 04/02/22 ?1142 04/02/22 ?2029 04/03/22 ?0007 04/03/22 ?5638 04/03/22 ?7564  ?GLUCAP 162* 99 120* 104* 80  ? ?Lipid Profile: ?No results for input(s): CHOL, HDL, LDLCALC, TRIG, CHOLHDL, LDLDIRECT in the last 72 hours. ?Thyroid Function Tests: ?No results for input(s): TSH, T4TOTAL, FREET4, T3FREE, THYROIDAB in the last 72 hours. ?Anemia Panel: ?No results for input(s): VITAMINB12, FOLATE, FERRITIN, TIBC, IRON, RETICCTPCT in the  last 72 hours. ?Sepsis Labs: ?Recent Labs  ?Lab 03/29/22 ?1839  ?LATICACIDVEN 2.5*  ? ? ?Recent Results (from the past 240 hour(s))  ?Culture, blood (Routine x 2)     Status: None  ? Collection Time: 03/29/22  6:39 PM  ? Specimen:

## 2022-04-03 NOTE — Progress Notes (Signed)
[facility] ?Progress Note ? ?Subjective: no new c/o. 2.5 L off w/ HD yest. No c/o's.  ? ?Vitals:  ? 04/02/22 1954 04/02/22 2030 04/03/22 0001 04/03/22 0427  ?BP: 121/88  139/63 (!) 152/64  ?Pulse: 82  84 84  ?Resp: '18 16 18 18  '$ ?Temp:  98.2 ?F (36.8 ?C) 98.3 ?F (36.8 ?C) 98.6 ?F (37 ?C)  ?TempSrc:  Oral Oral Oral  ?SpO2:  99% 100% 99%  ?Weight:      ?Height:      ? ? ?Exam: ?Gen alert, very HOH ?No jvd or bruits ?Chest clear bilat to bases ?RRR no RG ?Abd soft obese, ntnd no mass or ascites +bs ?Ext bilat mild pitting edema bilat LE ?Neuro is alert, Ox 3 , nf ?   RIJ TDC/ RUA AVF (maturing, +bruit) ?  ?  ?  ?  ? Home meds include - asa, lipitor, phoslo 1 ac, lantus and lispro insulin, norvasc 10, coreg 18.75 bid, risperdal 3 mg hs, prns/ vits/ supps ?  ?  ? OP HD: [facility] MWF ?  4h 12mn  400/1.5   95.5kg  2/2 bath  TDC (RUA AVF healing) Hep 2500 ? - last Hb 6.9 on 4/12 ? - mircera 200 q2, last 4/12, due 4/26 ? - venofer 100 x 10, 4 left ? - compliance= A, 2-4 kg ave uf ?  ?  ?Assessment/ Plan: ?B foot/ heel ulcers w/ osteomyelitis - + severe pvd. Not a revasc candidate. Plan if for bilat BKA on Wed. IV vanc/ zosyn per pmd.  ?ESRD - on HD MWF. Had HD yest. Plan HD off schedule today due to surgery planned for Wed.  ?Vol/ HTN - home norvasc/ coreg on hold, BP's controlled. 2L under dry wt. Diffuse mild edema LE's. Cont to lower vol gradually w/ HD.  ?Anemia ckd - Hb was low, got 2u prbc's 4/14, Hb improved. Was getting IV fe and ESA at op unit. Next esa due 4/26. Holding IV Fe w/ acute infection. ?MBD ckd - CCa in range, phos is low. Hold binder for now.  ?DM2 - on insulin, per pmd ?  ?  ?  ? ? ? ? ?Rob SDoctor, hospital?04/03/2022, 7:05 AM ? ? ?Recent Labs  ?Lab 03/29/22 ?1839 03/30/22 ?0330 03/30/22 ?0825 03/31/22 ?0378504/16/23 ?0885004/17/23 ?0277404/18/23 ?0326  ?HGB 7.5* 6.2*  --  9.2*   < > 9.2* 9.2*  ?ALBUMIN 2.1*  --   --   --   --   --   --   ?CALCIUM 8.4* 7.9*  --  7.9*  --   --   --   ?PHOS  --   --   2.7  --   --   --   --   ?CREATININE 3.95* 4.15*  --  3.22*  --   --   --   ?K 2.8* 2.5*  --  3.7  --   --   --   ? < > = values in this interval not displayed.  ? ? ?Inpatient medications: ? sodium chloride   Intravenous Once  ? sodium chloride   Intravenous Once  ? alteplase      ? atorvastatin  40 mg Oral Daily  ? Chlorhexidine Gluconate Cloth  6 each Topical Q0600  ? heparin injection (subcutaneous)  5,000 Units Subcutaneous Q8H  ? insulin aspart  0-6 Units Subcutaneous Q4H  ? insulin glargine-yfgn  14 Units Subcutaneous QHS  ? scopolamine  1 patch Transdermal Q72H  ? ?  piperacillin-tazobactam (ZOSYN)  IV 2.25 g (04/03/22 0540)  ? vancomycin 1,000 mg (03/30/22 1458)  ? ?acetaminophen **OR** acetaminophen, diphenhydrAMINE, HYDROmorphone (DILAUDID) injection, oxyCODONE ? ? ? ? ? ? ?

## 2022-04-04 ENCOUNTER — Encounter (HOSPITAL_COMMUNITY)
Admission: EM | Disposition: A | Discharge status: Skilled Nursing Facility | Payer: Self-pay | Source: Ambulatory Visit | Attending: Family Medicine

## 2022-04-04 ENCOUNTER — Inpatient Hospital Stay (HOSPITAL_COMMUNITY): Payer: Medicare Other | Admitting: Anesthesiology

## 2022-04-04 ENCOUNTER — Encounter (HOSPITAL_COMMUNITY): Payer: Self-pay | Admitting: Internal Medicine

## 2022-04-04 ENCOUNTER — Other Ambulatory Visit: Payer: Self-pay

## 2022-04-04 DIAGNOSIS — L03116 Cellulitis of left lower limb: Secondary | ICD-10-CM | POA: Diagnosis not present

## 2022-04-04 DIAGNOSIS — D649 Anemia, unspecified: Secondary | ICD-10-CM | POA: Diagnosis not present

## 2022-04-04 DIAGNOSIS — I132 Hypertensive heart and chronic kidney disease with heart failure and with stage 5 chronic kidney disease, or end stage renal disease: Secondary | ICD-10-CM

## 2022-04-04 DIAGNOSIS — I509 Heart failure, unspecified: Secondary | ICD-10-CM

## 2022-04-04 DIAGNOSIS — I2510 Atherosclerotic heart disease of native coronary artery without angina pectoris: Secondary | ICD-10-CM

## 2022-04-04 DIAGNOSIS — E11621 Type 2 diabetes mellitus with foot ulcer: Secondary | ICD-10-CM | POA: Diagnosis not present

## 2022-04-04 DIAGNOSIS — Z87891 Personal history of nicotine dependence: Secondary | ICD-10-CM

## 2022-04-04 DIAGNOSIS — M869 Osteomyelitis, unspecified: Secondary | ICD-10-CM | POA: Diagnosis not present

## 2022-04-04 DIAGNOSIS — E871 Hypo-osmolality and hyponatremia: Secondary | ICD-10-CM

## 2022-04-04 DIAGNOSIS — L97509 Non-pressure chronic ulcer of other part of unspecified foot with unspecified severity: Secondary | ICD-10-CM

## 2022-04-04 DIAGNOSIS — E1152 Type 2 diabetes mellitus with diabetic peripheral angiopathy with gangrene: Secondary | ICD-10-CM

## 2022-04-04 DIAGNOSIS — Z794 Long term (current) use of insulin: Secondary | ICD-10-CM

## 2022-04-04 DIAGNOSIS — M86272 Subacute osteomyelitis, left ankle and foot: Secondary | ICD-10-CM

## 2022-04-04 DIAGNOSIS — M86271 Subacute osteomyelitis, right ankle and foot: Secondary | ICD-10-CM | POA: Diagnosis not present

## 2022-04-04 DIAGNOSIS — R9431 Abnormal electrocardiogram [ECG] [EKG]: Secondary | ICD-10-CM

## 2022-04-04 DIAGNOSIS — I96 Gangrene, not elsewhere classified: Secondary | ICD-10-CM

## 2022-04-04 HISTORY — PX: AMPUTATION: SHX166

## 2022-04-04 LAB — CBC WITH DIFFERENTIAL/PLATELET
Abs Immature Granulocytes: 0.23 10*3/uL — ABNORMAL HIGH (ref 0.00–0.07)
Basophils Absolute: 0.1 10*3/uL (ref 0.0–0.1)
Basophils Relative: 0 %
Eosinophils Absolute: 0.1 10*3/uL (ref 0.0–0.5)
Eosinophils Relative: 1 %
HCT: 29.9 % — ABNORMAL LOW (ref 36.0–46.0)
Hemoglobin: 9.2 g/dL — ABNORMAL LOW (ref 12.0–15.0)
Immature Granulocytes: 1 %
Lymphocytes Relative: 15 %
Lymphs Abs: 2.4 10*3/uL (ref 0.7–4.0)
MCH: 27.5 pg (ref 26.0–34.0)
MCHC: 30.8 g/dL (ref 30.0–36.0)
MCV: 89.5 fL (ref 80.0–100.0)
Monocytes Absolute: 1.5 10*3/uL — ABNORMAL HIGH (ref 0.1–1.0)
Monocytes Relative: 9 %
Neutro Abs: 12.4 10*3/uL — ABNORMAL HIGH (ref 1.7–7.7)
Neutrophils Relative %: 74 %
Platelets: 326 10*3/uL (ref 150–400)
RBC: 3.34 MIL/uL — ABNORMAL LOW (ref 3.87–5.11)
RDW: 16 % — ABNORMAL HIGH (ref 11.5–15.5)
WBC: 16.7 10*3/uL — ABNORMAL HIGH (ref 4.0–10.5)
nRBC: 0 % (ref 0.0–0.2)

## 2022-04-04 LAB — GLUCOSE, CAPILLARY
Glucose-Capillary: 100 mg/dL — ABNORMAL HIGH (ref 70–99)
Glucose-Capillary: 109 mg/dL — ABNORMAL HIGH (ref 70–99)
Glucose-Capillary: 125 mg/dL — ABNORMAL HIGH (ref 70–99)
Glucose-Capillary: 125 mg/dL — ABNORMAL HIGH (ref 70–99)
Glucose-Capillary: 127 mg/dL — ABNORMAL HIGH (ref 70–99)
Glucose-Capillary: 140 mg/dL — ABNORMAL HIGH (ref 70–99)
Glucose-Capillary: 223 mg/dL — ABNORMAL HIGH (ref 70–99)
Glucose-Capillary: 255 mg/dL — ABNORMAL HIGH (ref 70–99)
Glucose-Capillary: 95 mg/dL (ref 70–99)

## 2022-04-04 LAB — COMPREHENSIVE METABOLIC PANEL
ALT: 14 U/L (ref 0–44)
AST: 16 U/L (ref 15–41)
Albumin: 1.8 g/dL — ABNORMAL LOW (ref 3.5–5.0)
Alkaline Phosphatase: 59 U/L (ref 38–126)
Anion gap: 12 (ref 5–15)
BUN: 12 mg/dL (ref 8–23)
CO2: 22 mmol/L (ref 22–32)
Calcium: 7.9 mg/dL — ABNORMAL LOW (ref 8.9–10.3)
Chloride: 98 mmol/L (ref 98–111)
Creatinine, Ser: 2.95 mg/dL — ABNORMAL HIGH (ref 0.44–1.00)
GFR, Estimated: 18 mL/min — ABNORMAL LOW (ref >60)
Glucose, Bld: 114 mg/dL — ABNORMAL HIGH (ref 70–99)
Potassium: 4.2 mmol/L (ref 3.5–5.1)
Sodium: 132 mmol/L — ABNORMAL LOW (ref 135–145)
Total Bilirubin: 0.6 mg/dL (ref 0.3–1.2)
Total Protein: 6.3 g/dL — ABNORMAL LOW (ref 6.5–8.1)

## 2022-04-04 LAB — VITAMIN D 25 HYDROXY (VIT D DEFICIENCY, FRACTURES): Vit D, 25-Hydroxy: 14.28 ng/mL — ABNORMAL LOW (ref 30–100)

## 2022-04-04 LAB — PREALBUMIN: Prealbumin: 8.4 mg/dL — ABNORMAL LOW (ref 18–38)

## 2022-04-04 LAB — MAGNESIUM: Magnesium: 2 mg/dL (ref 1.7–2.4)

## 2022-04-04 SURGERY — AMPUTATION BELOW KNEE
Anesthesia: Regional | Site: Knee | Laterality: Bilateral

## 2022-04-04 MED ORDER — JUVEN PO PACK
1.0000 | PACK | Freq: Two times a day (BID) | ORAL | Status: DC
  Administered 2022-04-05 – 2022-04-07 (×3): 1 via ORAL
  Filled 2022-04-04 (×5): qty 1

## 2022-04-04 MED ORDER — CEFAZOLIN SODIUM-DEXTROSE 2-4 GM/100ML-% IV SOLN: 2.0000 g | Freq: Three times a day (TID) | INTRAVENOUS | Status: DC

## 2022-04-04 MED ORDER — POLYETHYLENE GLYCOL 3350 17 G PO PACK: 17.0000 g | PACK | Freq: Every day | ORAL | Status: DC | PRN

## 2022-04-04 MED ORDER — DEXAMETHASONE SODIUM PHOSPHATE 10 MG/ML IJ SOLN
INTRAMUSCULAR | Status: DC | PRN
  Administered 2022-04-04: 4 mg via INTRAVENOUS

## 2022-04-04 MED ORDER — FENTANYL CITRATE (PF) 100 MCG/2ML IJ SOLN
INTRAMUSCULAR | Status: AC
Start: 2022-04-04 — End: 2022-04-04
  Administered 2022-04-04: 50 ug
  Filled 2022-04-04: qty 2

## 2022-04-04 MED ORDER — HYDROMORPHONE HCL 1 MG/ML IJ SOLN
0.5000 mg | INTRAMUSCULAR | Status: DC | PRN
  Administered 2022-04-04 – 2022-04-05 (×2): 1 mg via INTRAVENOUS
  Filled 2022-04-04 (×2): qty 1

## 2022-04-04 MED ORDER — GUAIFENESIN-DM 100-10 MG/5ML PO SYRP: 15.0000 mL | ORAL_SOLUTION | ORAL | Status: DC | PRN

## 2022-04-04 MED ORDER — MAGNESIUM SULFATE 2 GM/50ML IV SOLN: 2.0000 g | Freq: Every day | INTRAVENOUS | Status: DC | PRN

## 2022-04-04 MED ORDER — ONDANSETRON HCL 4 MG/2ML IJ SOLN
INTRAMUSCULAR | Status: AC
  Filled 2022-04-04: qty 8

## 2022-04-04 MED ORDER — BUPIVACAINE HCL 0.25 % IJ SOLN
INTRAMUSCULAR | Status: DC | PRN
  Administered 2022-04-04: 7 mL
  Administered 2022-04-04: 13 mL
  Administered 2022-04-04: 7 mL
  Administered 2022-04-04: 13 mL

## 2022-04-04 MED ORDER — BISACODYL 5 MG PO TBEC
5.0000 mg | DELAYED_RELEASE_TABLET | Freq: Every day | ORAL | Status: DC | PRN
  Administered 2022-04-05: 5 mg via ORAL
  Filled 2022-04-04: qty 1

## 2022-04-04 MED ORDER — 0.9 % SODIUM CHLORIDE (POUR BTL) OPTIME
TOPICAL | Status: DC | PRN
  Administered 2022-04-04: 1000 mL

## 2022-04-04 MED ORDER — ACETAMINOPHEN 325 MG PO TABS: 325.0000 mg | ORAL_TABLET | Freq: Four times a day (QID) | ORAL | Status: DC | PRN

## 2022-04-04 MED ORDER — MIDAZOLAM HCL 2 MG/2ML IJ SOLN: 1.0000 mg | Freq: Once | INTRAMUSCULAR | Status: AC

## 2022-04-04 MED ORDER — VANCOMYCIN HCL IN DEXTROSE 1-5 GM/200ML-% IV SOLN
1000.0000 mg | Freq: Once | INTRAVENOUS | Status: AC
  Filled 2022-04-04: qty 200

## 2022-04-04 MED ORDER — PANTOPRAZOLE SODIUM 40 MG PO TBEC
40.0000 mg | DELAYED_RELEASE_TABLET | Freq: Every day | ORAL | Status: DC
  Administered 2022-04-04 – 2022-04-10 (×7): 40 mg via ORAL
  Filled 2022-04-04 (×7): qty 1

## 2022-04-04 MED ORDER — MIDAZOLAM HCL 2 MG/2ML IJ SOLN
INTRAMUSCULAR | Status: AC
  Administered 2022-04-04: 1 mg via INTRAVENOUS
  Filled 2022-04-04: qty 2

## 2022-04-04 MED ORDER — ONDANSETRON HCL 4 MG/2ML IJ SOLN
INTRAMUSCULAR | Status: DC | PRN
  Administered 2022-04-04: 4 mg via INTRAVENOUS

## 2022-04-04 MED ORDER — HYDROMORPHONE HCL 2 MG PO TABS
2.0000 mg | ORAL_TABLET | ORAL | Status: DC | PRN
  Administered 2022-04-05: 3 mg via ORAL
  Administered 2022-04-05 – 2022-04-09 (×12): 2 mg via ORAL
  Administered 2022-04-10: 3 mg via ORAL
  Administered 2022-04-10 (×2): 2 mg via ORAL
  Filled 2022-04-04 (×2): qty 1
  Filled 2022-04-04: qty 2
  Filled 2022-04-04 (×8): qty 1
  Filled 2022-04-04: qty 2
  Filled 2022-04-04 (×4): qty 1

## 2022-04-04 MED ORDER — METOPROLOL TARTRATE 5 MG/5ML IV SOLN: 2.0000 mg | INTRAVENOUS | Status: DC | PRN

## 2022-04-04 MED ORDER — ZINC SULFATE 220 (50 ZN) MG PO CAPS
220.0000 mg | ORAL_CAPSULE | Freq: Every day | ORAL | Status: DC
  Administered 2022-04-04 – 2022-04-10 (×7): 220 mg via ORAL
  Filled 2022-04-04 (×7): qty 1

## 2022-04-04 MED ORDER — EPHEDRINE SULFATE (PRESSORS) 50 MG/ML IJ SOLN
INTRAMUSCULAR | Status: DC | PRN
  Administered 2022-04-04: 5 mg via INTRAVENOUS

## 2022-04-04 MED ORDER — FENTANYL CITRATE (PF) 250 MCG/5ML IJ SOLN
INTRAMUSCULAR | Status: AC
  Filled 2022-04-04: qty 5

## 2022-04-04 MED ORDER — LABETALOL HCL 5 MG/ML IV SOLN: 10.0000 mg | INTRAVENOUS | Status: DC | PRN

## 2022-04-04 MED ORDER — DEXAMETHASONE SODIUM PHOSPHATE 10 MG/ML IJ SOLN
INTRAMUSCULAR | Status: AC
  Filled 2022-04-04: qty 2

## 2022-04-04 MED ORDER — CALCIUM ACETATE (PHOS BINDER) 667 MG PO CAPS
667.0000 mg | ORAL_CAPSULE | Freq: Three times a day (TID) | ORAL | Status: DC
  Administered 2022-04-04 – 2022-04-10 (×18): 667 mg via ORAL
  Filled 2022-04-04 (×18): qty 1

## 2022-04-04 MED ORDER — SODIUM CHLORIDE 0.9 % IV SOLN: INTRAVENOUS | Status: DC | PRN

## 2022-04-04 MED ORDER — ALBUMIN HUMAN 5 % IV SOLN: INTRAVENOUS | Status: DC | PRN

## 2022-04-04 MED ORDER — OXYCODONE HCL 5 MG PO TABS
5.0000 mg | ORAL_TABLET | ORAL | Status: DC | PRN
  Administered 2022-04-05: 10 mg via ORAL
  Filled 2022-04-04 (×2): qty 2

## 2022-04-04 MED ORDER — MIDAZOLAM HCL 2 MG/2ML IJ SOLN
INTRAMUSCULAR | Status: AC
  Filled 2022-04-04: qty 2

## 2022-04-04 MED ORDER — VANCOMYCIN HCL IN DEXTROSE 1-5 GM/200ML-% IV SOLN
INTRAVENOUS | Status: AC
  Administered 2022-04-04: 1000 mg via INTRAVENOUS
  Filled 2022-04-04: qty 200

## 2022-04-04 MED ORDER — ALUM & MAG HYDROXIDE-SIMETH 200-200-20 MG/5ML PO SUSP: 15.0000 mL | ORAL | Status: DC | PRN

## 2022-04-04 MED ORDER — CHLORHEXIDINE GLUCONATE 4 % EX LIQD
60.0000 mL | Freq: Once | CUTANEOUS | Status: AC
  Administered 2022-04-04: 4 via TOPICAL
  Filled 2022-04-04: qty 60

## 2022-04-04 MED ORDER — DOCUSATE SODIUM 100 MG PO CAPS
100.0000 mg | ORAL_CAPSULE | Freq: Every day | ORAL | Status: DC
  Administered 2022-04-05 – 2022-04-07 (×2): 100 mg via ORAL
  Filled 2022-04-04 (×4): qty 1

## 2022-04-04 MED ORDER — ASCORBIC ACID 500 MG PO TABS
1000.0000 mg | ORAL_TABLET | Freq: Every day | ORAL | Status: DC
  Administered 2022-04-04 – 2022-04-10 (×7): 1000 mg via ORAL
  Filled 2022-04-04 (×7): qty 2

## 2022-04-04 MED ORDER — PHENOL 1.4 % MT LIQD: 1.0000 | OROMUCOSAL | Status: DC | PRN

## 2022-04-04 MED ORDER — PROPOFOL 10 MG/ML IV BOLUS
INTRAVENOUS | Status: DC | PRN
  Administered 2022-04-04: 110 mg via INTRAVENOUS

## 2022-04-04 MED ORDER — LIDOCAINE 2% (20 MG/ML) 5 ML SYRINGE
INTRAMUSCULAR | Status: DC | PRN
  Administered 2022-04-04: 40 mg via INTRAVENOUS

## 2022-04-04 MED ORDER — MAGNESIUM CITRATE PO SOLN
1.0000 | Freq: Once | ORAL | Status: DC | PRN
  Filled 2022-04-04: qty 296

## 2022-04-04 MED ORDER — BUPIVACAINE LIPOSOME 1.3 % IJ SUSP
INTRAMUSCULAR | Status: DC | PRN
  Administered 2022-04-04: 3 mL
  Administered 2022-04-04 (×2): 7 mL
  Administered 2022-04-04: 3 mL

## 2022-04-04 MED ORDER — POTASSIUM CHLORIDE CRYS ER 20 MEQ PO TBCR: 20.0000 meq | EXTENDED_RELEASE_TABLET | Freq: Every day | ORAL | Status: DC | PRN

## 2022-04-04 MED ORDER — PHENYLEPHRINE HCL-NACL 20-0.9 MG/250ML-% IV SOLN
INTRAVENOUS | Status: DC | PRN
  Administered 2022-04-04: 40 ug/min via INTRAVENOUS

## 2022-04-04 MED ORDER — SODIUM CHLORIDE 0.9 % IV SOLN: INTRAVENOUS | Status: DC

## 2022-04-04 MED ORDER — FENTANYL CITRATE (PF) 100 MCG/2ML IJ SOLN: 50.0000 ug | Freq: Once | INTRAMUSCULAR | Status: DC

## 2022-04-04 SURGICAL SUPPLY — 37 items
BAG COUNTER SPONGE SURGICOUNT (BAG) IMPLANT
BLADE SAW RECIP 87.9 MT (BLADE) ×2 IMPLANT
BLADE SURG 21 STRL SS (BLADE) ×2 IMPLANT
BNDG COHESIVE 6X5 TAN STRL LF (GAUZE/BANDAGES/DRESSINGS) IMPLANT
CANISTER WOUND CARE 500ML ATS (WOUND CARE) ×2 IMPLANT
COVER SURGICAL LIGHT HANDLE (MISCELLANEOUS) ×2 IMPLANT
CUFF TOURN SGL QUICK 34 (TOURNIQUET CUFF) ×2
CUFF TRNQT CYL 34X4.125X (TOURNIQUET CUFF) ×1 IMPLANT
DRAPE INCISE IOBAN 66X45 STRL (DRAPES) ×2 IMPLANT
DRAPE U-SHAPE 47X51 STRL (DRAPES) ×2 IMPLANT
DRESSING PREVENA PLUS CUSTOM (GAUZE/BANDAGES/DRESSINGS) ×1 IMPLANT
DRSG PREVENA PLUS CUSTOM (GAUZE/BANDAGES/DRESSINGS) ×2
DURAPREP 26ML APPLICATOR (WOUND CARE) ×2 IMPLANT
ELECT REM PT RETURN 9FT ADLT (ELECTROSURGICAL) ×2
ELECTRODE REM PT RTRN 9FT ADLT (ELECTROSURGICAL) ×1 IMPLANT
GLOVE BIOGEL PI IND STRL 9 (GLOVE) ×1 IMPLANT
GLOVE BIOGEL PI INDICATOR 9 (GLOVE) ×1
GLOVE SURG ORTHO 9.0 STRL STRW (GLOVE) ×2 IMPLANT
GOWN STRL REUS W/ TWL XL LVL3 (GOWN DISPOSABLE) ×2 IMPLANT
GOWN STRL REUS W/TWL XL LVL3 (GOWN DISPOSABLE) ×4
KIT BASIN OR (CUSTOM PROCEDURE TRAY) ×2 IMPLANT
KIT TURNOVER KIT B (KITS) ×2 IMPLANT
MANIFOLD NEPTUNE II (INSTRUMENTS) ×2 IMPLANT
NS IRRIG 1000ML POUR BTL (IV SOLUTION) ×2 IMPLANT
PACK ORTHO EXTREMITY (CUSTOM PROCEDURE TRAY) ×2 IMPLANT
PAD ARMBOARD 7.5X6 YLW CONV (MISCELLANEOUS) ×2 IMPLANT
PREVENA RESTOR ARTHOFORM 46X30 (CANNISTER) ×2 IMPLANT
SPONGE T-LAP 18X18 ~~LOC~~+RFID (SPONGE) IMPLANT
STAPLER VISISTAT 35W (STAPLE) IMPLANT
STOCKINETTE IMPERVIOUS LG (DRAPES) ×2 IMPLANT
SUT ETHILON 2 0 PSLX (SUTURE) IMPLANT
SUT SILK 2 0 (SUTURE) ×2
SUT SILK 2-0 18XBRD TIE 12 (SUTURE) ×1 IMPLANT
SUT VIC AB 1 CTX 27 (SUTURE) ×4 IMPLANT
TOWEL GREEN STERILE (TOWEL DISPOSABLE) ×2 IMPLANT
TUBE CONNECTING 12X1/4 (SUCTIONS) ×2 IMPLANT
YANKAUER SUCT BULB TIP NO VENT (SUCTIONS) ×2 IMPLANT

## 2022-04-04 NOTE — Assessment & Plan Note (Signed)
See above

## 2022-04-04 NOTE — Progress Notes (Signed)
Pharmacy Antibiotic Note ? ?Nancy [name] is a 61 y.o. female admitted on 03/29/2022 with  osteomyelitis .  Pharmacy has been consulted for vancomycin and zosyn dosing. Pt is on iHD MWF. iHD on 4/17 and also on 4/18 for amputation on 4/19 and vancomycin not given at either session per [facility].  ? ?Plan: ?-Continue zosyn 2.25g IV q8 ?-Vancomycin '1000mg'$  x1 now  ?-Continue vancomycin 1g IV MWF ?-Follow up ortho recommendations on vancomycin after procedure today ?-Monitor CBC, renal fx, cultures and clinical progress ? ?Height: '5\' 3"'$  (160 cm) ?Weight:  (bed scale incorrect, need recalibration, pt unable to stand) ?IBW/kg (Calculated) : 52.4 ? ?Temp (24hrs), Avg:98.5 ?F (36.9 ?C), Min:97.8 ?F (36.6 ?C), Max:99 ?F (37.2 ?C) ? ?Recent Labs  ?Lab 03/29/22 ?1839 03/30/22 ?0330 03/31/22 ?6767 04/01/22 ?2094 04/02/22 ?7096 04/03/22 ?0326 04/03/22 ?1042 04/03/22 ?1043 04/04/22 ?0136 04/04/22 ?2836  ?WBC 27.0* 23.7* 24.7* 23.6* 20.8* 17.1* 17.0*  --   --  16.7*  ?CREATININE 3.95* 4.15* 3.22*  --   --   --   --  3.88* 2.95*  --   ?LATICACIDVEN 2.5*  --   --   --   --   --   --   --   --   --   ? ?  ?Estimated Creatinine Clearance: 21.8 mL/min (A) (by C-G formula based on SCr of 2.95 mg/dL (H)).   ? ?Allergies  ?Allergen Reactions  ? Hydralazine Hcl Other (See Comments)  ?  Hair loss  ? Hydrocodone Itching and Other (See Comments)  ?  Upset stomach  ? Metformin And Related Nausea And Vomiting and Other (See Comments)  ?  Stomach pains, also  ? Other Nausea Only and Other (See Comments)  ?  Lettuce- Does not digest this!!  ? Plaquenil [Hydroxychloroquine Sulfate] Hives  ? Shellfish-Derived Products Nausea Only and Other (See Comments)  ?  Caused an upset stomach  ? Shrimp (Diagnostic) Nausea Only and Other (See Comments)  ?  Upset stomach   ? Sulfa Antibiotics Hives  ? ? ?Antimicrobials this admission: ?Zosyn 4/13 >>  ?Vancomycin 4/13 >>  ? ?Dose adjustments this admission: ? ? ?Microbiology results: ?4/13 BCx: negative ? ?Cristela Felt, PharmD, BCPS ?Clinical Pharmacist ?04/04/2022 11:26 AM ? ? ? ? ? ?

## 2022-04-04 NOTE — Assessment & Plan Note (Addendum)
On admission.   ?- Repeat EKG to verify   ?

## 2022-04-04 NOTE — Anesthesia Procedure Notes (Signed)
Anesthesia Regional Block: Popliteal block  ? ?Pre-Anesthetic Checklist: , timeout performed,  Correct Patient, Correct Site, Correct Laterality,  Correct Procedure, Correct Position, site marked,  Risks and benefits discussed,  Surgical consent,  Pre-op evaluation,  At surgeon's request and post-op pain management ? ?Laterality: Left ? ?Prep: chloraprep     ?  ?Needles:  ?Injection technique: Single-shot ? ?Needle Type: Echogenic Stimulator Needle   ? ? ? ? ? ? ? ?Additional Needles: ? ? ?Procedures:,,,, ultrasound used (permanent image in chart),,    ?Narrative:  ?Start time: 04/04/2022 2:03 PM ?End time: 04/04/2022 2:13 PM ?Injection made incrementally with aspirations every 5 mL. ? ?Performed by: Personally  ?Anesthesiologist: Duane Boston, MD ? ?Additional Notes: ?A functioning IV was confirmed and monitors were applied.  Sterile prep and drape, hand hygiene and sterile gloves were used.  Negative aspiration and test dose prior to incremental administration of local anesthetic. The patient tolerated the procedure well.Ultrasound  guidance: relevant anatomy identified, needle position confirmed, local anesthetic spread visualized around nerve(s), vascular puncture avoided.  Image printed for medical record. ACB supplement. ? ? ? ?

## 2022-04-04 NOTE — Assessment & Plan Note (Addendum)
Na stable ?- Volume status by HD ?

## 2022-04-04 NOTE — Interval H&P Note (Signed)
History and Physical Interval Note: ? ?04/04/2022 ?6:42 AM ? ?Nancy [name]  has presented today for surgery, with the diagnosis of Gangrene Bilateral Feet.  The various methods of treatment have been discussed with the patient and family. After consideration of risks, benefits and other options for treatment, the patient has consented to  Procedure(s): ?BILATERAL BELOW KNEE AMPUTATION (Bilateral) as a surgical intervention.  The patient's history has been reviewed, patient examined, no change in status, stable for surgery.  I have reviewed the patient's chart and labs.  Questions were answered to the patient's satisfaction.   ? ? ?Newt Minion ? ? ?

## 2022-04-04 NOTE — Op Note (Signed)
04/04/2022 ? ?4:01 PM ? ?PATIENT:  Nancy [name]   ? ?PRE-OPERATIVE DIAGNOSIS:  Gangrene Bilateral Feet ? ?POST-OPERATIVE DIAGNOSIS:  Same ? ?PROCEDURE:  BILATERAL BELOW KNEE AMPUTATION ?Application bilateral circumferential negative pressure therapy with compression sock. ? ? ? ?SURGEON:  Newt Minion, MD ? ?ANESTHESIA:   General ? ?PREOPERATIVE INDICATIONS:  Nancy [name] is a  61 y.o. female with a diagnosis of Gangrene Bilateral Feet who failed conservative measures and elected for surgical management.   ? ?The risks benefits and alternatives were discussed with the patient preoperatively including but not limited to the risks of infection, bleeding, nerve injury, cardiopulmonary complications, the need for revision surgery, among others, and the patient was willing to proceed. ? ?OPERATIVE IMPLANTS: No implants ? ? ?OPERATIVE FINDINGS: Good petechial bleeding on both lower extremities. ? ?OPERATIVE PROCEDURE: Patient was brought to the operating room after undergoing a regional anesthetic.  After adequate levels anesthesia were obtained a thigh tourniquet was placed on both lower extremities and both lower extremity was prepped using DuraPrep draped into a sterile field. The feet were draped out of the sterile field with impervious stockinette.  A timeout was called and the tourniquet inflated.  A transverse skin incision was made on the left lower extremity 12 cm distal to the tibial tubercle, the incision curved proximally, and a large posterior flap was created.  The tibia was transected just proximal to the skin incision and beveled anteriorly.  The fibula was transected just proximal to the tibial incision.  The sciatic nerve was pulled cut and allowed to retract.  The vascular bundles were suture ligated with 2-0 silk.  The tourniquet was deflated and hemostasis obtained.  The deep and superficial fascial layers were closed using #1 Vicryl.  The skin was closed using staples.   ? ?Attention  was then focused on the right lower extremity. ? ?A transverse skin incision was made on the right lower extremity 12 cm distal to the tibial tubercle, the incision curved proximally, and a large posterior flap was created.  The tibia was transected just proximal to the skin incision and beveled anteriorly.  The fibula was transected just proximal to the tibial incision.  The sciatic nerve was pulled cut and allowed to retract.  The vascular bundles were suture ligated with 2-0 silk.  The tourniquet was deflated and hemostasis obtained.  The deep and superficial fascial layers were closed using #1 Vicryl.  The skin was closed using staples.   ? ?The Prevena customizable dressing was applied to both lower extremities this was overwrapped with the arthroform sponge.  Charlie Pitter was used to secure the sponges and the circumferential compression was secured to the skin with Dermatac.  This was connected to the wound VAC pumps and had a good suction fit this was covered with a stump shrinker and a limb protector.  Patient was taken to the PACU in stable condition. ? ? ?DISCHARGE PLANNING: ? ?Antibiotic duration: 24-hour antibiotics ? ?Weightbearing: Nonweightbearing on the operative extremity ? ?Pain medication: Opioid pathway ? ?Dressing care/ Wound VAC: Continue wound VAC with the Prevena plus pump at discharge for 1 week ? ?Ambulatory devices: Walker or kneeling scooter ? ?Discharge to: Discharge planning based on recommendations per physical therapy ? ?Follow-up: In the office 1 week after discharge. ? ? ? ? ? ? ? ? ?

## 2022-04-04 NOTE — Assessment & Plan Note (Signed)
BMI 36 with hypertension, diabetes ?

## 2022-04-04 NOTE — Assessment & Plan Note (Addendum)
-   Continue Zetia, aspirin, atorvastatin ?

## 2022-04-04 NOTE — Progress Notes (Signed)
?Progress Note ? ? ?Patient: Nancy [name]:[id number] DOB: 01/11/1961 DOA: 03/29/2022     6 ?DOS: the patient was seen and examined on 04/04/2022 at 1:11PM ?  ? ? ? ?Brief hospital course: ?[name] is a 61 y.o. F with ESRD on HD, DM, neuropathy, dCHF, HTN, HLD, PVD, hx CVA, CAD, and obesity BMI 36 who was sent from wound clinic due to swelling, drainage from her chronically infected heel wound.   ? ?X-ray of the foot showed extensive soft tissue swelling with findings of osteomyelitis in the calcaneus.   ? ? ? ? ? ?Assessment and Plan: ?* Diabetic foot ulcers ([place]) ?Gangrenous ulcers of both heels with cellulitis and osteomyelitis of left calcaneus ?PAD ?- Continue vancomycin and Zosyn ?- Follow-up on cultures. ?- Consult Ortho, plan for amputation today ? ?Nausea & vomiting-resolved as of 04/04/2022 ?Likely due to constipation.  Abdominal film was unremarkable.  Abdomen is benign on examination.  Diabetic gastroparesis is also possibility.  Continue to monitor for now.  Scopolamine patch was ordered.   ?No further episodes of nausea and vomiting noted. ? ?ESRD on dialysis [facility]) ?New start HD last month ?- Cnosult Nephrolgoy for HD ? ?Hypokalemia ?QT prolongation ? ?Improved with supplementation. ? ?Chronic diastolic CHF (congestive heart failure) ([place]) ?Echo done October 2022 showing LVEF 50 to 55% and grade 2 diastolic dysfunction.  No significant volume overload. ?- Volume management per dialysis ? ?Insulin dependent type 2 diabetes mellitus ([place]) ?Presented with a glucose level greater than 400.  HbA1c 6.3 in March. ?-Continue Glargine ?-Continue SS corrections ?-Continue statin ? ?CAD (coronary artery disease) ?No chest pain ? ?Chronic anemia ? Hgb stable, no clinical bleeding ? ?Prolonged QT interval ?  ? ?Hyponatremia ?Na stable ? ?Subacute osteomyelitis, right ankle and foot ([place]) ?See above ? ?Peripheral artery disease ([place]) ?See above ?-Continue statin ? ?Cellulitis of left foot ?See  above ? ?HTN (hypertension) ?BP slightly up ?-Continue metop ?-Hold home amlodipine ? ?Morbid obesity ([place]) ?BMI 36 with hypertension, diabetes ? ? ? ? ? ? ? ? ? ?Subjective: Patient has no dyspnea, headache, chest pain, abdominal pain. ? ? ? ? ?Physical Exam: ?Vitals:  ? 04/04/22 1605 04/04/22 1620 04/04/22 1635 04/04/22 1653  ?BP: (!) 143/66 (!) 143/67 (!) 153/67 (!) 156/74  ?Pulse: 80 79 80 79  ?Resp: '12 20 18   '$ ?Temp:   98.5 ?F (36.9 ?C) 97.9 ?F (36.6 ?C)  ?TempSrc:    Oral  ?SpO2: 100% 100% 100% 100%  ?Weight:      ?Height:      ? ?Elderly adult female, lying in bed, interactive, hard of hearing ?RRR, no murmurs, no peripheral edema ?Respiratory rate normal, lungs clear without rales or wheezes ?Abdomen soft no tenderness palpation or guarding, no ascites or distention ?There is some mild edema of the right heel, has some drainage on the bandage, there is tenderness around the foot ? ?Data Reviewed: ?Nephrology notes reviewed, orthopedics notes reviewed, vital signs reviewed ?Labs are notable for creatinine 2.95, sodium 132 ?Albumin 1.8, prealbumin 8.4 ?Hemoglobin 9, no change ?White blood cell count 17 down to 16 ? ?Family Communication: Sister at the bedside ? ? ? ?Disposition: ?Status is: Inpatient ?Patient presented with osteomyelitis of the left heel.  This will require amputation which will happen today.  Postop, she will need to work with physical therapy, and efforts made to determine her place  of rehabilitation ? ? ? ? ? ? ? ?Author: ?Edwin Dada, MD ?04/04/2022 5:14  PM ? ?For on call review [email or web address].  ? ? ?

## 2022-04-04 NOTE — Progress Notes (Signed)
[facility] ?Progress Note ? ?Subjective: no new c/o. Had HD yest off schedule.  ? ?Vitals:  ? 04/03/22 2300 04/04/22 0006 04/04/22 0331 04/04/22 0406  ?BP:  (!) 144/58 (!) 144/64   ?Pulse:  79 84   ?Resp: '14 17 14 16  '$ ?Temp:  98.8 ?F (37.1 ?C) 98.2 ?F (36.8 ?C)   ?TempSrc:  Oral Oral   ?SpO2:  97% 96%   ?Weight:      ?Height:      ? ? ?Exam: ?Gen alert, very HOH ?No jvd or bruits ?Chest clear bilat to bases ?RRR no RG ?Abd soft obese, ntnd no mass or ascites +bs ?Ext bilat mild pitting edema bilat LE ?Neuro is alert, Ox 3 , nf ?   RIJ TDC/ RUA AVF (maturing, +bruit) ?  ?  ?  ?  ? Home meds include - asa, lipitor, phoslo 1 ac, lantus and lispro insulin, norvasc 10, coreg 18.75 bid, risperdal 3 mg hs, prns/ vits/ supps ?  ?  ? OP HD: [facility] MWF ?  4h 43mn  400/1.5   95.5kg  2/2 bath  TDC (RUA AVF healing) Hep 2500 ? - last Hb 6.9 on 4/12 ? - mircera 200 q2, last 4/12, due 4/26 ? - venofer 100 x 10, 4 left ? - compliance= A, 2-4 kg ave uf ?  ?  ?Assessment/ Plan: ?B foot/ heel ulcers w/ osteomyelitis - + severe pvd. Not a revasc candidate. Plan is for bilat BKA today. IV vanc/ zosyn per pmd.  ?ESRD - on HD MWF. Had HD here Monday and Tuesday. Next HD prob Friday.  ?Vol/ HTN - home norvasc/ coreg on hold, BP's controlled. 2 kg under dry wt. Mild edema LE's. Cont to lower vol gradually w/ HD.  ?Anemia ckd - Hb was low, got 2u prbc's 4/14, Hb improved. Was getting IV fe and ESA at op unit. Next esa due 4/26. No IV Fe w/ acute infection. ?MBD ckd - CCa in range, phos in range. Resume binder.  ?DM2 - on insulin, per pmd ?  ?  ?  ? ? ? ? ?Rob SDoctor, hospital?04/04/2022, 7:54 AM ? ? ?Recent Labs  ?Lab 03/30/22 ?0825 03/31/22 ?0712404/18/23 ?1042 04/03/22 ?1043 04/04/22 ?0136 04/04/22 ?05809 ?HGB  --    < > 8.5*  --   --  9.2*  ?ALBUMIN  --   --   --  1.6* 1.8*  --   ?CALCIUM  --    < >  --  8.1* 7.9*  --   ?PHOS 2.7  --   --  4.3  --   --   ?CREATININE  --    < >  --  3.88* 2.95*  --   ?K  --    < >  --  4.0 4.2  --   ?  < > = values in this interval not displayed.  ? ? ?Inpatient medications: ? sodium chloride   Intravenous Once  ? sodium chloride   Intravenous Once  ? atorvastatin  40 mg Oral Daily  ? Chlorhexidine Gluconate Cloth  6 each Topical Q0600  ? heparin injection (subcutaneous)  5,000 Units Subcutaneous Q8H  ? insulin aspart  0-6 Units Subcutaneous Q4H  ? insulin glargine-yfgn  8 Units Subcutaneous QHS  ? scopolamine  1 patch Transdermal Q72H  ? ? piperacillin-tazobactam (ZOSYN)  IV 2.25 g (04/04/22 0533)  ? vancomycin 1,000 mg (03/30/22 1458)  ? ?acetaminophen **OR** acetaminophen, diphenhydrAMINE, HYDROmorphone (DILAUDID) injection, oxyCODONE ? ? ? ? ? ? ?

## 2022-04-04 NOTE — Anesthesia Preprocedure Evaluation (Addendum)
Anesthesia Evaluation  ?Patient identified by MRN, date of birth, ID band ?Patient awake ? ? ? ?Reviewed: ?Allergy & Precautions, NPO status , Patient's Chart, lab work & pertinent test results ? ?Airway ?Mallampati: III ? ?TM Distance: >3 FB ?Neck ROM: Full ? ? ? Dental ? ?(+) Poor Dentition, Loose, Missing ?  ?Pulmonary ?neg pulmonary ROS, former smoker,  ?  ?Pulmonary exam normal ? ? ? ? ? ? ? Cardiovascular ?hypertension, Pt. on medications and Pt. on home beta blockers ?+ CAD, + Peripheral Vascular Disease and +CHF  ?Normal cardiovascular exam ? ?IMPRESSIONS  ? ? ??1. LV function low normal to mildly reduced.  ??2. Left ventricular ejection fraction, by estimation, is 50 to 55%. The  ?left ventricle has low normal function. The left ventricle has no regional  ?wall motion abnormalities. There is moderate left ventricular hypertrophy.  ?Left ventricular diastolic  ?parameters are consistent with Grade II diastolic dysfunction  ?(pseudonormalization). Elevated left atrial pressure.  ??3. Right ventricular systolic function is normal. The right ventricular  ?size is normal.  ??4. Left atrial size was mildly dilated.  ??5. The mitral valve is normal in structure. No evidence of mitral valve  ?regurgitation. No evidence of mitral stenosis.  ??6. The aortic valve is tricuspid. Aortic valve regurgitation is not  ?visualized. No aortic stenosis is present.  ??7. The inferior vena cava is normal in size with greater than 50%  ?respiratory variability, suggesting right atrial pressure of 3 mmHg.  ?  ?Neuro/Psych ?Depression CVA   ? GI/Hepatic ?Neg liver ROS, GERD  ,  ?Endo/Other  ?diabetes, Type 2, Insulin Dependent ? Renal/GU ?ESRFRenal disease  ?negative genitourinary ?  ?Musculoskeletal ?negative musculoskeletal ROS ?(+)  ? Abdominal ?Normal abdominal exam  (+)   ?Peds ? Hematology ? ?(+) Blood dyscrasia, anemia ,   ?Anesthesia Other Findings ? ? Reproductive/Obstetrics ? ?   ? ? ? ? ? ? ? ? ? ? ? ? ? ?  ?  ? ? ? ? ? ? ? ?Anesthesia Physical ? ?Anesthesia Plan ? ?ASA: 4 ? ?Anesthesia Plan: General  ? ?Post-op Pain Management: Regional block*  ? ?Induction: Intravenous ? ?PONV Risk Score and Plan: 3 and Midazolam, Treatment may vary due to age or medical condition, Ondansetron and Dexamethasone ? ?Airway Management Planned: LMA ? ?Additional Equipment: None ? ?Intra-op Plan:  ? ?Post-operative Plan: Extubation in OR ? ?Informed Consent: I have reviewed the patients History and Physical, chart, labs and discussed the procedure including the risks, benefits and alternatives for the proposed anesthesia with the patient or authorized representative who has indicated his/her understanding and acceptance.  ? ? ? ?Dental advisory given ? ?Plan Discussed with: Anesthesiologist ? ?Anesthesia Plan Comments:   ? ? ? ? ? ?Anesthesia Quick Evaluation ? ?

## 2022-04-04 NOTE — Anesthesia Postprocedure Evaluation (Signed)
Anesthesia Post Note ? ?Patient: Nancy [name] ? ?Procedure(s) Performed: BILATERAL BELOW KNEE AMPUTATION (Bilateral: Knee) ? ?  ? ?Patient location during evaluation: PACU ?Anesthesia Type: General ?Level of consciousness: awake and alert ?Pain management: pain level controlled ?Vital Signs Assessment: post-procedure vital signs reviewed and stable ?Respiratory status: spontaneous breathing, nonlabored ventilation, respiratory function stable and patient connected to nasal cannula oxygen ?Cardiovascular status: blood pressure returned to baseline and stable ?Postop Assessment: no apparent nausea or vomiting ?Anesthetic complications: no ? ? ?No notable events documented. ? ?Last Vitals:  ?Vitals:  ? 04/04/22 1635 04/04/22 1653  ?BP: (!) 153/67 (!) 156/74  ?Pulse: 80 79  ?Resp: 18   ?Temp: 36.9 ?C 36.6 ?C  ?SpO2: 100% 100%  ?  ?Last Pain:  ?Vitals:  ? 04/04/22 1653  ?TempSrc: Oral  ?PainSc: 0-No pain  ? ? ?  ?  ?  ?  ?  ?  ? ?Audry Pili ? ? ? ? ?

## 2022-04-04 NOTE — Anesthesia Procedure Notes (Signed)
Procedure Name: LMA Insertion ?Date/Time: 04/04/2022 2:50 PM ?Performed by: Leonor Liv, CRNA ?Pre-anesthesia Checklist: Patient identified, Emergency Drugs available, Suction available and Patient being monitored ?Patient Re-evaluated:Patient Re-evaluated prior to induction ?Oxygen Delivery Method: Circle System Utilized ?Preoxygenation: Pre-oxygenation with 100% oxygen ?Induction Type: IV induction ?Ventilation: Mask ventilation without difficulty ?LMA: LMA with gastric port inserted ?LMA Size: 4.0 ?Number of attempts: 1 ?Airway Equipment and Method: Bite block ?Placement Confirmation: positive ETCO2 ?Tube secured with: Tape ?Dental Injury: Teeth and Oropharynx as per pre-operative assessment  ? ? ? ? ?

## 2022-04-04 NOTE — Transfer of Care (Signed)
Immediate Anesthesia Transfer of Care Note ? ?Patient: Nancy [name] ? ?Procedure(s) Performed: BILATERAL BELOW KNEE AMPUTATION (Bilateral: Knee) ? ?Patient Location: PACU ? ?Anesthesia Type:General ? ?Level of Consciousness: drowsy ? ?Airway & Oxygen Therapy: Patient Spontanous Breathing and Patient connected to nasal cannula oxygen ? ?Post-op Assessment: Report given to RN and Post -op Vital signs reviewed and stable ? ?Post vital signs: Reviewed and stable ? ?Last Vitals:  ?Vitals Value Taken Time  ?BP 122/57 04/04/22 1552  ?Temp    ?Pulse    ?Resp 16 04/04/22 1554  ?SpO2    ?Vitals shown include unvalidated device data. ? ?Last Pain:  ?Vitals:  ? 04/04/22 1335  ?TempSrc: Oral  ?PainSc:   ?   ? ?Patients Stated Pain Goal: 0 (04/04/22 0406) ? ?Complications: No notable events documented. ?

## 2022-04-04 NOTE — Anesthesia Procedure Notes (Addendum)
Anesthesia Regional Block: Popliteal block  ? ?Pre-Anesthetic Checklist: , timeout performed,  Correct Patient, Correct Site, Correct Laterality,  Correct Procedure, Correct Position, site marked,  Risks and benefits discussed,  Surgical consent,  Pre-op evaluation,  At surgeon's request and post-op pain management ? ?Laterality: Right ? ?Prep: chloraprep     ?  ?Needles:  ?Injection technique: Single-shot ? ?Needle Type: Echogenic Stimulator Needle   ? ? ? ? ? ? ? ?Additional Needles: ? ? ?Procedures:,,,, ultrasound used (permanent image in chart),,    ?Narrative:  ?Start time: 04/04/2022 1:53 PM ?End time: 04/04/2022 2:03 PM ?Injection made incrementally with aspirations every 5 mL. ? ?Performed by: Personally  ?Anesthesiologist: Duane Boston, MD ? ?Additional Notes: ?A functioning IV was confirmed and monitors were applied.  Sterile prep and drape, hand hygiene and sterile gloves were used.  Negative aspiration and test dose prior to incremental administration of local anesthetic. The patient tolerated the procedure well.Ultrasound  guidance: relevant anatomy identified, needle position confirmed, local anesthetic spread visualized around nerve(s), vascular puncture avoided.  Image printed for medical record. ACB supplement. ? ? ? ? ?

## 2022-04-05 ENCOUNTER — Encounter (HOSPITAL_COMMUNITY): Payer: Self-pay | Admitting: Orthopedic Surgery

## 2022-04-05 ENCOUNTER — Other Ambulatory Visit: Payer: Self-pay

## 2022-04-05 DIAGNOSIS — D649 Anemia, unspecified: Secondary | ICD-10-CM | POA: Diagnosis not present

## 2022-04-05 DIAGNOSIS — I2510 Atherosclerotic heart disease of native coronary artery without angina pectoris: Secondary | ICD-10-CM | POA: Diagnosis not present

## 2022-04-05 DIAGNOSIS — E11621 Type 2 diabetes mellitus with foot ulcer: Secondary | ICD-10-CM | POA: Diagnosis not present

## 2022-04-05 DIAGNOSIS — N184 Chronic kidney disease, stage 4 (severe): Secondary | ICD-10-CM

## 2022-04-05 DIAGNOSIS — L03116 Cellulitis of left lower limb: Secondary | ICD-10-CM | POA: Diagnosis not present

## 2022-04-05 LAB — CBC
HCT: 33 % — ABNORMAL LOW (ref 36.0–46.0)
Hemoglobin: 10.2 g/dL — ABNORMAL LOW (ref 12.0–15.0)
MCH: 27.7 pg (ref 26.0–34.0)
MCHC: 30.9 g/dL (ref 30.0–36.0)
MCV: 89.7 fL (ref 80.0–100.0)
Platelets: 322 10*3/uL (ref 150–400)
RBC: 3.68 MIL/uL — ABNORMAL LOW (ref 3.87–5.11)
RDW: 15.8 % — ABNORMAL HIGH (ref 11.5–15.5)
WBC: 13.9 10*3/uL — ABNORMAL HIGH (ref 4.0–10.5)
nRBC: 0 % (ref 0.0–0.2)

## 2022-04-05 LAB — GLUCOSE, CAPILLARY
Glucose-Capillary: 292 mg/dL — ABNORMAL HIGH (ref 70–99)
Glucose-Capillary: 353 mg/dL — ABNORMAL HIGH (ref 70–99)
Glucose-Capillary: 261 mg/dL — ABNORMAL HIGH (ref 70–99)
Glucose-Capillary: 188 mg/dL — ABNORMAL HIGH (ref 70–99)
Glucose-Capillary: 167 mg/dL — ABNORMAL HIGH (ref 70–99)

## 2022-04-05 LAB — BASIC METABOLIC PANEL
Anion gap: 12 (ref 5–15)
BUN: 24 mg/dL — ABNORMAL HIGH (ref 8–23)
CO2: 21 mmol/L — ABNORMAL LOW (ref 22–32)
Calcium: 8 mg/dL — ABNORMAL LOW (ref 8.9–10.3)
Chloride: 99 mmol/L (ref 98–111)
Creatinine, Ser: 4.54 mg/dL — ABNORMAL HIGH (ref 0.44–1.00)
GFR, Estimated: 10 mL/min — ABNORMAL LOW (ref >60)
Glucose, Bld: 282 mg/dL — ABNORMAL HIGH (ref 70–99)
Potassium: 6 mmol/L — ABNORMAL HIGH (ref 3.5–5.1)
Sodium: 132 mmol/L — ABNORMAL LOW (ref 135–145)

## 2022-04-05 MED ORDER — ACETAMINOPHEN 500 MG PO TABS
1000.0000 mg | ORAL_TABLET | Freq: Three times a day (TID) | ORAL | Status: DC
Start: 2022-04-05 — End: 2022-04-10
  Administered 2022-04-05 – 2022-04-10 (×13): 1000 mg via ORAL
  Filled 2022-04-05 (×14): qty 2

## 2022-04-05 MED ORDER — INSULIN ASPART 100 UNIT/ML IJ SOLN
2.0000 [IU] | Freq: Three times a day (TID) | INTRAMUSCULAR | Status: DC
  Administered 2022-04-05 – 2022-04-10 (×12): 2 [IU] via SUBCUTANEOUS

## 2022-04-05 MED ORDER — LACTATED RINGERS IV SOLN: INTRAVENOUS | Status: DC

## 2022-04-05 MED ORDER — SODIUM ZIRCONIUM CYCLOSILICATE 10 G PO PACK
10.0000 g | PACK | Freq: Three times a day (TID) | ORAL | Status: AC
  Administered 2022-04-05 (×2): 10 g via ORAL
  Filled 2022-04-05 (×2): qty 1

## 2022-04-05 NOTE — Progress Notes (Signed)
Pt receives out-pt HD at [facility]) on MWF. Starting next week, pt will need to arrive at 11:25 for 11:45 chair time. Noted pt will need snf at d/c. Will assist as needed.  ? ?Melven Sartorius ?Renal Navigator ?[phone] ?

## 2022-04-05 NOTE — Progress Notes (Signed)
Occupational Therapy Evaluation ?Patient Details ?Name: Nancy [name] ?MRN: [id number] ?DOB: [birth year] ?Today's Date: 04/05/2022 ? ? ?History of Present Illness Pt is a 61 y/o female admitted for surgical mgmt of necrotic abscesses of bilateral feet. 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 PMH: DM2, CVA, ESRD on HD, PVD, HTN, CHF, neuropathy, anemia, depression, and obesity.  ? ?Clinical Impression ?  ?PTA, pt lives with spouse in level entry apartment. Pt poor historian this AM but reports light assist for ADLs and use of cane vs wheelchair at home. Pt presents with significant change in functional status s/p B BKA with deficits in pain, endurance, sitting balance, and strength. Pt requires Mod-Max x 2 assist for bed mobility and scooting attempts along bedside. Pt currently requires Min A for UB ADLs and up to Total A for LB ADLs. Limited in further activities d/t progressive lightheadedness sitting EOB. Based on significant life change w/ surgery, below reported functional baseline and pt reported support at DC, recommend AIR consult to maximize independence/safety with daily tasks. Plan to progress OOB transfers and dynamic sitting balance for LB ADLs in next sessions. ? ?BP supine: 144/67 ?BP sitting EOB: 102/65 ?BP after return to supine: 132/65 ?   ? ?Recommendations for follow up therapy are one component of a multi-disciplinary discharge planning process, led by the attending physician.  Recommendations may be updated based on patient status, additional functional criteria and insurance authorization.  ? ?Follow Up Recommendations ? Acute inpatient rehab (3hours/day)  ?  ?Assistance Recommended at Discharge Frequent or constant Supervision/Assistance  ?Patient can return home with the following Two people to help with walking and/or transfers;A lot of help with bathing/dressing/bathroom ? ?  ?Functional Status Assessment ? Patient has had a recent decline in their functional status and  demonstrates the ability to make significant improvements in function in a reasonable and predictable amount of time.  ?Equipment Recommendations ? Hospital bed (TBD)  ?  ?Recommendations for Other Services Rehab consult ? ? ?  ?Precautions / Restrictions Precautions ?Precautions: Fall;Other (comment) ?Precaution Comments: B wound vacs/limb protectors; monitor orthostatics ?Restrictions ?Weight Bearing Restrictions: Yes ?RLE Weight Bearing: Non weight bearing ?LLE Weight Bearing: Non weight bearing  ? ?  ? ?Mobility Bed Mobility ?Overal bed mobility: Needs Assistance ?Bed Mobility: Supine to Sit, Sit to Supine ?  ?  ?Supine to sit: Mod assist, +2 for physical assistance, HOB elevated ?Sit to supine: Max assist, +2 for physical assistance, HOB elevated ?  ?General bed mobility comments: Able to assist in pulling trunk forward with bed rails. assist for LE mgmt and scooting hips to EOB. Max A x 2 to return to supine due to progressive lightheadedness ?  ? ?Transfers ?Overall transfer level: Needs assistance ?Equipment used: None ?  ?  ?  ?  ?  ?  ?  ?General transfer comment: attempted scooting along HOB with Mod A x 2 though pt limited by progressive lightheadedness and reported need to lay down ?  ? ?  ?Balance Overall balance assessment: Needs assistance ?Sitting-balance support: No upper extremity supported, Feet supported, Bilateral upper extremity supported ?Sitting balance-Leahy Scale: Fair ?Sitting balance - Comments: no LOB with static sitting, close min guard for safety ?  ?  ?  ?  ?  ?  ?  ?  ?  ?  ?  ?  ?  ?  ?  ?   ? ?ADL either performed or assessed with clinical judgement  ? ?ADL Overall ADL's :  Needs assistance/impaired ?Eating/Feeding: Set up;Bed level ?  ?Grooming: Min guard;Sitting ?  ?Upper Body Bathing: Minimal assistance;Sitting ?  ?Lower Body Bathing: Maximal assistance;Sitting/lateral leans;Bed level ?  ?Upper Body Dressing : Minimal assistance;Sitting ?  ?Lower Body Dressing: Maximal  assistance;Sitting/lateral leans;Bed level ?  ?  ?  ?Toileting- Clothing Manipulation and Hygiene: Bed level;Sitting/lateral lean;Total assistance ?  ?  ?  ?  ?General ADL Comments: Pt with new B BKA significantly impacting ability to complete ADLs/transfers as previously done at home. Pt with expected post op soreness, lightheadedness with initial movement and questionable cognitive involvment increasing fall risk and requiring increased assist for daily tasks  ? ? ? ?Vision Baseline Vision/History: 1 Wears glasses ?Ability to See in Adequate Light: 1 Impaired ?Patient Visual Report: No change from baseline ?Vision Assessment?: No apparent visual deficits  ?   ?Perception   ?  ?Praxis   ?  ? ?Pertinent Vitals/Pain Pain Assessment ?Pain Assessment: Faces ?Faces Pain Scale: Hurts a little bit ?Pain Descriptors / Indicators: Sore ?Pain Intervention(s): Limited activity within patient's tolerance, Monitored during session, Premedicated before session, Repositioned  ? ? ? ?Hand Dominance Right ?  ?Extremity/Trunk Assessment Upper Extremity Assessment ?Upper Extremity Assessment: Overall WFL for tasks assessed ?  ?Lower Extremity Assessment ?Lower Extremity Assessment: Defer to PT evaluation ?  ?Cervical / Trunk Assessment ?Cervical / Trunk Assessment: Normal ?  ?Communication Communication ?Communication: Expressive difficulties;Other (comment) (question HOH vs cognitive involvement) ?  ?Cognition Arousal/Alertness: Awake/alert, Lethargic ?Behavior During Therapy: Flat affect ?Overall Cognitive Status: No family/caregiver present to determine baseline cognitive functioning ?  ?  ?  ?  ?  ?  ?  ?  ?  ?  ?  ?  ?  ?  ?  ?  ?General Comments: question HOH with pt intermittently responding to questions though did indicate she was unable to hear therapist at one point. able to follow one step commands, slower processing though may also be due to pain medications. Conflicting PLOF info given during session and in comparison to  previous admission ?  ?  ?General Comments    ? ?  ?Exercises   ?  ?Shoulder Instructions    ? ? ?Home Living Family/patient expects to be discharged to:: Private residence ?Living Arrangements: Spouse/significant other ?Available Help at Discharge: Family ?Type of Home: Apartment ?Home Access: Level entry ?  ?  ?Home Layout: One level ?  ?  ?Bathroom Shower/Tub: Walk-in shower ?  ?Bathroom Toilet: Handicapped height ?Bathroom Accessibility: Yes ?  ?Home Equipment: Rolling Walker (2 wheels);BSC/3in1;Shower seat;Cane - single point;Wheelchair - manual ?  ?Additional Comments: Pt reports husband is at home all of the time to assist but per previous admissions, husband works during the day ?  ? ?  ?Prior Functioning/Environment Prior Level of Function : Needs assist;Patient poor historian/Family not available ?  ?  ?  ?Physical Assist : Mobility (physical);ADLs (physical) ?Mobility (physical): Bed mobility;Gait;Stairs ?ADLs (physical): Bathing;Dressing;IADLs ?Mobility Comments: Pt reports ambulating some with cane in apartment, use of wheelchair outside of apartment but reports using w/c inside as well. per previous notes, pt reports walking to the bathroom to shower with husband's assist, otherwise transfers only from [facility] to w/c and recliner. ?ADLs Comments: Today, pt reports light assist for some ADLs. per previous notes, assisted for showering and LB dressing, all IADL, does not drive ?  ? ?  ?  ?OT Problem List: Decreased activity tolerance;Impaired balance (sitting and/or standing);Decreased cognition;Decreased safety awareness;Decreased knowledge of use of DME  or AE;Decreased knowledge of precautions;Pain ?  ?   ?OT Treatment/Interventions: Self-care/ADL training;Therapeutic exercise;Energy conservation;DME and/or AE instruction;Therapeutic activities;Balance training;Patient/family education  ?  ?OT Goals(Current goals can be found in the care plan section) Acute Rehab OT Goals ?Patient Stated Goal: agreeable to  sit EOB, none specifically stated today ?OT Goal Formulation: With patient ?Time For Goal Achievement: 04/19/22 ?Potential to Achieve Goals: Good ?ADL Goals ?Pt Will Perform Lower Body Bathing: with min assist;s

## 2022-04-05 NOTE — Progress Notes (Signed)
Inpatient Rehabilitation Admissions Coordinator  ? ?I met with patient , brother and sister at bedside. Prior to admit patient was wheelchair level at home and she and spouse were attempting to arrange PCS services at home for he works. She lacks the caregiver supports for CIR admit, therefore SNF is recommended at this time. I will alert acute team and TOC. We will not pursue Cir and will sign off. ? ? , RN, MSN ?Rehab Admissions Coordinator ?[phone] ?04/05/2022 12:03 PM ? ?

## 2022-04-05 NOTE — Progress Notes (Signed)
?Progress Note ? ? ?Patient: Nancy [name] QVZ:[id number] DOB: 07-19-1961 DOA: 03/29/2022     7 ?DOS: the patient was seen and examined on 04/05/2022 at 10:24 AM ?  ? ? ? ?Brief hospital course: ?Nancy [name] is a 61 y.o. F with ESRD on HD, DM, neuropathy, dCHF, HTN, HLD, PVD, hx CVA, CAD, and obesity BMI 36 who was sent from wound clinic due to swelling, drainage from her chronically infected heel wound.   ? ? ?4/13: Admitted on antibiotics, found to have exposed calcaneous with extensive surrounding tissue loss ?4/14: Vascular surgery consulted, given above, no role for limb salvage ?4/15: Orthopedics consulted, recommend bilateral BKAs ?4/17: Family and patient decide to proceed with amputation ?4/19: To OR for bilateral amputation by Dr. Sharol Given ? ? ? ? ?Assessment and Plan: ?* Gangrenous ulcers of both heels with cellulitis and osteomyelitis of left calcaneus ?S/P bilateral BKA on 4/19 by Dr. Sharol Given ? ?- Continue vancomycin and Zosyn for 24 hours post-op then stop ? ? ? . ? ?ESRD on dialysis [facility]) ?New start HD last month ?- Consult Nephrology for routine HD ?-Continue Phoslo ? ?  ? ?Chronic diastolic CHF (congestive heart failure) ([place]) ?Echo done October 2022 showing LVEF 50 to 55% and grade 2 diastolic dysfunction.  No significant volume overload. ?- Volume management per dialysis ? ?Insulin dependent type 2 diabetes mellitus ([place]) ?Presented with a glucose level greater than 400.  HbA1c 6.3 in March. ? ?Glucose trending up with diet restarted ?- Continue Glargine ?- Continue SS corrections ?- Add mealtime insulin ?- Continue statin ? ?CAD (coronary artery disease) ?No chest pain.on atorvastatin ? ?Chronic anemia ? Hgb up to 10, no clinical bleeding ? ?Prolonged QT interval ?   ? ?Hyponatremia ?Na stable ?- Volume status by HD ? ?Hyperkalemia ?K up today.  ?- HD per Nephrology ? ?  ? ?Peripheral artery disease ([place]) ?See above ?-Continue statin ?  ? ?HTN (hypertension) ?BP controlled overnight ?- Continue  metoprolol ?- Hold home amlodipine ? ?Morbid obesity ([place]) ?BMI 36 with hypertension, diabetes ? ? ? ? ? ? ? ? ? ?Subjective: Patient reports pain in her "foot".  Denies chest pain, dyspnea, nursing report no concerns.  No vomiting, no respiratory distress.  No fever. ? ? ? ? ?Physical Exam: ?Vitals:  ? 04/04/22 2000 04/04/22 2329 04/05/22 0337 04/05/22 0800  ?BP: 108/89 119/77 (!) 109/52 110/62  ?Pulse: 81 82 82 75  ?Resp: '15 16 16 10  '$ ?Temp: 97.9 ?F (36.6 ?C) 97.8 ?F (36.6 ?C) 97.9 ?F (36.6 ?C) 97.9 ?F (36.6 ?C)  ?TempSrc: Oral Oral Oral Oral  ?SpO2: 96% 97% 97%   ?Weight:   99.4 kg   ?Height:      ? ?Adult female, lying in bed, appears debilitated ?Partially edentulous, oropharynx moist, no oral lesions ?RRR, no murmurs, no peripheral edema ?Respiratory effort normal, lung sounds clear without rales or wheezes ?Abdomen soft without tenderness palpation or guarding ?Attentive to my entrance, responds to questions, but seems to have some psychomotor slowing and slowed responses.  Moves upper extremities with generalized weakness but symmetric strength, speech dysarthric at baseline, face symmetric. ? ? ? ? ?Data Reviewed: ?Orthopedic and vascular surgery notes reviewed, vital signs reviewed.  Nursing notes reviewed. ?Labs notable for labile glucose, low vitamin D, low sodium, improving white blood cell count, improving hemoglobin. ? ?Family Communication:   ? ? ? ?Disposition: ?Status is: Inpatient ?Patient was admitted for bilateral diabetic foot ulcers with osteomyelitis of the left heel,  exposed bone ? ?Vascular surgery and orthopedics were consulted, there is no possibility for salvage limbs, and so she underwent bilateral amputation yesterday. ? ?She will need rehabilitation prior to returning home ? ? ? ? ? ? ? ? ? ? ? ? ?Author: ?Edwin Dada, MD ?04/05/2022 12:53 PM ? ?For on call review [email or web address].  ? ? ?

## 2022-04-05 NOTE — Progress Notes (Signed)
Patient ID: Nancy [name], female   DOB: 16-Aug-1961, 61 y.o.   MRN: [id number] ?Patient is a 61 year old woman who is seen postoperative day 1 status post bilateral transtibial amputations.  There is no drainage in the wound VAC canisters.  She is total assist for movement.  She did get hypotensive this morning with attempted sitting.  Anticipate patient will need discharge to skilled nursing. ?

## 2022-04-05 NOTE — Progress Notes (Signed)
[facility] ?Progress Note ? ?Subjective: no c/o, had surgery yesterday.  ? ?Vitals:  ? 04/04/22 2000 04/04/22 2329 04/05/22 0337 04/05/22 0800  ?BP: 108/89 119/77 (!) 109/52 110/62  ?Pulse: 81 82 82 75  ?Resp: '15 16 16 10  '$ ?Temp: 97.9 ?F (36.6 ?C) 97.8 ?F (36.6 ?C) 97.9 ?F (36.6 ?C) 97.9 ?F (36.6 ?C)  ?TempSrc: Oral Oral Oral Oral  ?SpO2: 96% 97% 97%   ?Weight:   99.4 kg   ?Height:      ? ? ?Exam: ?Gen alert, very HOH ?No jvd or bruits ?Chest clear bilat to bases ?RRR no RG ?Abd soft obese, ntnd no mass or ascites +bs ?Ext bilat mild pitting edema bilat LE ?Neuro is alert, Ox 3 , nf ?   RIJ TDC/ RUA AVF (maturing, +bruit) ?  ?  ?  ?  ? Home meds include - asa, lipitor, phoslo 1 ac, lantus and lispro insulin, norvasc 10, coreg 18.75 bid, risperdal 3 mg hs, prns/ vits/ supps ?  ?  ? OP HD: [facility] MWF ?  4h 28mn  400/1.5   95.5kg  2/2 bath  TDC (RUA AVF healing) Hep 2500 ? - 4/14 >  hep B Ag neg and hep B Abs were low / not protective  ? - last Hb 6.9 on 4/12 ? - mircera 200 q2, last 4/12, due 4/26 ? - venofer 100 x 10, 4 left ? - compliance= A, 2-4 kg ave uf ?  ?  ?Assessment/ Plan: ?B foot/ heel ulcers w/ osteomyelitis - + severe pvd. Not a revasc candidate. Plan is for bilat BKA today. IV vanc/ zosyn per pmd.  ?ESRD - on HD MWF. Had HD here Monday and Tuesday. Next HD Friday.  ?Vol/ HTN - home norvasc/ coreg on hold, BP's controlled. Mild edema LE's. Cont to lower vol gradually w/ HD.  ?Anemia ckd - Hb was low, got 2u prbc's 4/14, Hb improved. Was getting IV fe and ESA at op unit. Next esa due 4/26. No IV Fe w/ acute infection. ?MBD ckd - CCa in range, phos in range. Resume binder.  ?DM2 - on insulin, per pmd ?  ?  ?  ? ? ? ? ?Rob SDoctor, hospital?04/05/2022, 1:08 PM ? ? ?Recent Labs  ?Lab 03/30/22 ?0825 03/31/22 ?0332904/18/23 ?1043 04/04/22 ?0136 04/04/22 ?0518804/20/23 ?0136  ?HGB  --    < >  --   --  9.2* 10.2*  ?ALBUMIN  --   --  1.6* 1.8*  --   --   ?CALCIUM  --    < > 8.1* 7.9*  --  8.0*  ?PHOS 2.7  --   4.3  --   --   --   ?CREATININE  --    < > 3.88* 2.95*  --  4.54*  ?K  --    < > 4.0 4.2  --  6.0*  ? < > = values in this interval not displayed.  ? ? ?Inpatient medications: ? sodium chloride   Intravenous Once  ? sodium chloride   Intravenous Once  ? acetaminophen  1,000 mg Oral TID  ? vitamin C  1,000 mg Oral Daily  ? atorvastatin  40 mg Oral Daily  ? calcium acetate  667 mg Oral TID WC  ? Chlorhexidine Gluconate Cloth  6 each Topical Q0600  ? docusate sodium  100 mg Oral Daily  ? heparin injection (subcutaneous)  5,000 Units Subcutaneous Q8H  ? insulin aspart  0-6 Units Subcutaneous  Q4H  ? insulin aspart  2 Units Subcutaneous TID WC  ? insulin glargine-yfgn  8 Units Subcutaneous QHS  ? nutrition supplement (JUVEN)  1 packet Oral BID BM  ? pantoprazole  40 mg Oral Daily  ? scopolamine  1 patch Transdermal Q72H  ? zinc sulfate  220 mg Oral Daily  ? ? lactated ringers    ? magnesium sulfate bolus IVPB    ? piperacillin-tazobactam (ZOSYN)  IV 2.25 g (04/05/22 5038)  ? vancomycin 1,000 mg (03/30/22 1458)  ? ?alum & mag hydroxide-simeth, bisacodyl, diphenhydrAMINE, guaiFENesin-dextromethorphan, HYDROmorphone, labetalol, magnesium sulfate bolus IVPB, metoprolol tartrate, phenol, polyethylene glycol ? ? ? ? ? ? ?

## 2022-04-05 NOTE — Evaluation (Signed)
Physical Therapy Evaluation ?Patient Details ?Name: Nancy [name] ?MRN: [id number] ?DOB: [birth year] ?Today's Date: 04/05/2022 ? ?History of Present Illness ? Pt is a 61 y/o female admitted 03/29/22 for surgical mgmt of necrotic abscesses of bilateral feet. 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 PMH: DM2, CVA, ESRD on HD, PVD, HTN, CHF, neuropathy, anemia, depression, and obesity. ?  ?Clinical Impression ? Pt presents with condition above and deficits mentioned below, see PT Problem List. PTA, she was living with her husband in a 1-level apartment with a level entry. Pt is a poor historian this AM but reports using a cane for short household distance mobility and a w/c for other mobility. Currently, pt has had a significant medical and functional change with bil BKAs. She displays deficits in cognition, balance, activity tolerance, and bil lower extremity strength and sensation. She required mod-maxAx2 for bed mobility and modAx2 to scoot slightly along EOB. Pt limited in further mobility by progressive lightheadedness, see BP measures below. As pt has had a significant change in medical and functional status, she would benefit from intensive therapy in the AIR setting to maximize her independence and safety with all functional mobility and reduce her burden of care. Will continue to follow acutely. ? ?BP supine: 144/67 ?BP sitting EOB: 102/65 ?BP after return to supine: 132/65   ?   ? ?Recommendations for follow up therapy are one component of a multi-disciplinary discharge planning process, led by the attending physician.  Recommendations may be updated based on patient status, additional functional criteria and insurance authorization. ? ?Follow Up Recommendations Acute inpatient rehab (3hours/day) ? ?  ?Assistance Recommended at Discharge Frequent or constant Supervision/Assistance  ?Patient can return home with the following ? Two people to help with walking and/or transfers;A lot of help with  bathing/dressing/bathroom;Assistance with cooking/housework;Direct supervision/assist for medications management;Direct supervision/assist for financial management;Assist for transportation ? ?  ?Equipment Recommendations Hospital bed;Other (comment) (hoyer lift; pending progress)  ?Recommendations for Other Services ? Rehab consult  ?  ?Functional Status Assessment Patient has had a recent decline in their functional status and demonstrates the ability to make significant improvements in function in a reasonable and predictable amount of time.  ? ?  ?Precautions / Restrictions Precautions ?Precautions: Fall;Other (comment) ?Precaution Comments: Bil BKA with wound vacs/limb protectors; monitor orthostatics ?Restrictions ?Weight Bearing Restrictions: Yes ?RLE Weight Bearing: Non weight bearing ?LLE Weight Bearing: Non weight bearing  ? ?  ? ?Mobility ? Bed Mobility ?Overal bed mobility: Needs Assistance ?Bed Mobility: Supine to Sit, Sit to Supine ?  ?  ?Supine to sit: Mod assist, +2 for physical assistance, HOB elevated ?Sit to supine: Max assist, +2 for physical assistance, HOB elevated ?  ?General bed mobility comments: Able to assist in pulling trunk forward with bed rails. assist for LE mgmt and scooting hips to EOB. Max A x 2 to return to supine due to progressive lightheadedness ?  ? ?Transfers ?Overall transfer level: Needs assistance ?Equipment used: None ?  ?  ?  ?  ?  ?  ?  ?General transfer comment: attempted scooting along HOB with Mod A x 2 though pt limited by progressive lightheadedness and reported need to lay down ?  ? ?Ambulation/Gait ?  ?  ?  ?  ?  ?  ?  ?General Gait Details: N/A ? ?Stairs ?  ?  ?  ?  ?  ? ?Wheelchair Mobility ?  ? ?Modified Rankin (Stroke Patients Only) ?  ? ?  ? ?  Balance Overall balance assessment: Needs assistance ?Sitting-balance support: No upper extremity supported, Feet supported, Bilateral upper extremity supported ?Sitting balance-Leahy Scale: Fair ?Sitting balance -  Comments: no LOB with static sitting, close min guard-minA for safety ?  ?  ?  ?Standing balance comment: N/A ?  ?  ?  ?  ?  ?  ?  ?  ?  ?  ?  ?   ? ? ? ?Pertinent Vitals/Pain Pain Assessment ?Pain Assessment: Faces ?Faces Pain Scale: Hurts a little bit ?Pain Descriptors / Indicators: Sore ?Pain Intervention(s): Limited activity within patient's tolerance, Monitored during session, Premedicated before session, Repositioned  ? ? ?Home Living Family/patient expects to be discharged to:: Private residence ?Living Arrangements: Spouse/significant other ?Available Help at Discharge: Family ?Type of Home: Apartment ?Home Access: Level entry ?  ?  ?  ?Home Layout: One level ?Home Equipment: Rolling Walker (2 wheels);BSC/3in1;Shower seat;Cane - single point;Wheelchair - manual ?Additional Comments: Pt reports husband is at home all of the time to assist but per previous admissions, husband works during the day  ?  ?Prior Function Prior Level of Function : Needs assist;Patient poor historian/Family not available ?  ?  ?  ?Physical Assist : Mobility (physical);ADLs (physical) ?Mobility (physical): Bed mobility;Gait;Stairs ?ADLs (physical): Bathing;Dressing;IADLs ?Mobility Comments: Pt reports ambulating some with cane in apartment, use of wheelchair outside of apartment but reports using w/c inside as well. per previous notes, pt reports walking to the bathroom to shower with husband's assist, otherwise transfers only from [facility] to w/c and recliner. Reports she does not push her w/c, but rather has someone else push it ?ADLs Comments: Today, pt reports light assist for some ADLs. per previous notes, assisted for showering and LB dressing, all IADL, does not drive ?  ? ? ?Hand Dominance  ? Dominant Hand: Right ? ?  ?Extremity/Trunk Assessment  ? Upper Extremity Assessment ?Upper Extremity Assessment: Defer to OT evaluation ?  ? ?Lower Extremity Assessment ?Lower Extremity Assessment: RLE deficits/detail;LLE deficits/detail ?RLE  Deficits / Details: s/p BKA with pt reporting inability to detect touch distally at residual limb, but able to detect touch proximally at thigh ?LLE Deficits / Details: s/p BKA with pt reporting inability to detect touch distally at residual limb, but able to detect touch proximally at thigh ?  ? ?Cervical / Trunk Assessment ?Cervical / Trunk Assessment: Normal  ?Communication  ? Communication: Expressive difficulties;Other (comment) (question HOH vs cognitive involvement)  ?Cognition Arousal/Alertness: Awake/alert, Lethargic ?Behavior During Therapy: Flat affect ?Overall Cognitive Status: No family/caregiver present to determine baseline cognitive functioning ?  ?  ?  ?  ?  ?  ?  ?  ?  ?  ?  ?  ?  ?  ?  ?  ?General Comments: question HOH with pt intermittently responding to questions though did indicate she was unable to hear therapist at one point. able to follow one step commands, slower processing though may also be due to pain medications. Conflicting PLOF info given during session and in comparison to previous admission ?  ?  ? ?  ?General Comments General comments (skin integrity, edema, etc.): BP supine: 144/67  BP sitting EOB: 102/65  BP after return to supine: 132/65 ? ?  ?Exercises    ? ?Assessment/Plan  ?  ?PT Assessment Patient needs continued PT services  ?PT Problem List Decreased strength;Decreased range of motion;Decreased activity tolerance;Decreased balance;Decreased mobility;Decreased cognition;Cardiopulmonary status limiting activity;Impaired sensation;Decreased skin integrity;Pain ? ?   ?  ?PT Treatment Interventions DME  instruction;Functional mobility training;Therapeutic activities;Therapeutic exercise;Balance training;Neuromuscular re-education;Cognitive remediation;Patient/family education;Wheelchair mobility training   ? ?PT Goals (Current goals can be found in the Care Plan section)  ?Acute Rehab PT Goals ?Patient Stated Goal: to improve ?PT Goal Formulation: With patient ?Time For Goal  Achievement: 04/19/22 ?Potential to Achieve Goals: Fair ? ?  ?Frequency Min 3X/week ?  ? ? ?Co-evaluation PT/OT/SLP Co-Evaluation/Treatment: Yes ?Reason for Co-Treatment: Complexity of the patient's impairme

## 2022-04-05 NOTE — Plan of Care (Signed)

## 2022-04-05 NOTE — Assessment & Plan Note (Addendum)
Resolved

## 2022-04-05 NOTE — Progress Notes (Signed)
Inpatient Diabetes Program Recommendations ? ?AACE/ADA: New Consensus Statement on Inpatient Glycemic Control (2015) ? ?Target Ranges:  Prepandial:   less than 140 mg/dL ?     Peak postprandial:   less than 180 mg/dL (1-2 hours) ?     Critically ill patients:  140 - 180 mg/dL  ? ?Lab Results  ?Component Value Date  ? GLUCAP 353 (H) 04/05/2022  ? HGBA1C 6.3 (H) 02/16/2022  ? ? ?Review of Glycemic Control ? Latest Reference Range & Units 04/04/22 08:18 04/04/22 13:19 04/04/22 15:20 04/04/22 15:56 04/04/22 17:52 04/04/22 20:00 04/04/22 23:30 04/05/22 03:38 04/05/22 08:31  ?Glucose-Capillary 70 - 99 mg/dL 100 (H) 95 125 (H) 109 (H) 127 (H) 223 (H) 255 (H) 292 (H) 353 (H)  ? ?Diabetes history: DM 2 ?Outpatient Diabetes medications: Lantus 15 units qhs, Humalog 4 units breakfast and dinner, Dexcom CGM ?Current orders for Inpatient glycemic control:  ?Semglee 8 units qhs ?Novolog 0-6 units Q4 hours ? ?Inpatient Diabetes Program Recommendations:   ? ?Glucose controlled on current regimen while NPO. Diet started and glucose trends increased. ? ?-  Add Novolog 2 units tid meal coverage if eating >50% of meals ? ?Thanks, ? ?Tama Headings RN, MSN, BC-ADM ?Inpatient Diabetes Coordinator ?Team Pager [phone] (8a-5p) ? ? ? ?

## 2022-04-06 DIAGNOSIS — D649 Anemia, unspecified: Secondary | ICD-10-CM | POA: Diagnosis not present

## 2022-04-06 DIAGNOSIS — I2510 Atherosclerotic heart disease of native coronary artery without angina pectoris: Secondary | ICD-10-CM | POA: Diagnosis not present

## 2022-04-06 DIAGNOSIS — E875 Hyperkalemia: Secondary | ICD-10-CM

## 2022-04-06 DIAGNOSIS — L03116 Cellulitis of left lower limb: Secondary | ICD-10-CM | POA: Diagnosis not present

## 2022-04-06 DIAGNOSIS — E11621 Type 2 diabetes mellitus with foot ulcer: Secondary | ICD-10-CM | POA: Diagnosis not present

## 2022-04-06 LAB — GLUCOSE, CAPILLARY
Glucose-Capillary: 145 mg/dL — ABNORMAL HIGH (ref 70–99)
Glucose-Capillary: 145 mg/dL — ABNORMAL HIGH (ref 70–99)
Glucose-Capillary: 150 mg/dL — ABNORMAL HIGH (ref 70–99)
Glucose-Capillary: 161 mg/dL — ABNORMAL HIGH (ref 70–99)
Glucose-Capillary: 183 mg/dL — ABNORMAL HIGH (ref 70–99)
Glucose-Capillary: 85 mg/dL (ref 70–99)

## 2022-04-06 LAB — SURGICAL PATHOLOGY

## 2022-04-06 LAB — BASIC METABOLIC PANEL
Anion gap: 12 (ref 5–15)
BUN: 34 mg/dL — ABNORMAL HIGH (ref 8–23)
CO2: 24 mmol/L (ref 22–32)
Calcium: 8.2 mg/dL — ABNORMAL LOW (ref 8.9–10.3)
Chloride: 97 mmol/L — ABNORMAL LOW (ref 98–111)
Creatinine, Ser: 5.62 mg/dL — ABNORMAL HIGH (ref 0.44–1.00)
GFR, Estimated: 8 mL/min — ABNORMAL LOW (ref >60)
Glucose, Bld: 162 mg/dL — ABNORMAL HIGH (ref 70–99)
Potassium: 4.4 mmol/L (ref 3.5–5.1)
Sodium: 133 mmol/L — ABNORMAL LOW (ref 135–145)

## 2022-04-06 LAB — CBC
HCT: 22.4 % — ABNORMAL LOW (ref 36.0–46.0)
Hemoglobin: 6.9 g/dL — CL (ref 12.0–15.0)
MCH: 28.2 pg (ref 26.0–34.0)
MCHC: 30.8 g/dL (ref 30.0–36.0)
MCV: 91.4 fL (ref 80.0–100.0)
Platelets: 297 10*3/uL (ref 150–400)
RBC: 2.45 MIL/uL — ABNORMAL LOW (ref 3.87–5.11)
RDW: 15.8 % — ABNORMAL HIGH (ref 11.5–15.5)
WBC: 16 10*3/uL — ABNORMAL HIGH (ref 4.0–10.5)
nRBC: 0 % (ref 0.0–0.2)

## 2022-04-06 LAB — HEMOGLOBIN AND HEMATOCRIT, BLOOD
HCT: 22.1 % — ABNORMAL LOW (ref 36.0–46.0)
Hemoglobin: 6.7 g/dL — CL (ref 12.0–15.0)

## 2022-04-06 LAB — PREPARE RBC (CROSSMATCH)

## 2022-04-06 MED ORDER — EZETIMIBE 10 MG PO TABS
10.0000 mg | ORAL_TABLET | Freq: Every day | ORAL | Status: DC
  Administered 2022-04-06 – 2022-04-10 (×5): 10 mg via ORAL
  Filled 2022-04-06 (×5): qty 1

## 2022-04-06 MED ORDER — HEPARIN SODIUM (PORCINE) 1000 UNIT/ML DIALYSIS
1500.0000 [IU] | INTRAMUSCULAR | Status: DC | PRN
  Administered 2022-04-06: 3200 [IU] via INTRAVENOUS_CENTRAL
  Filled 2022-04-06: qty 2

## 2022-04-06 MED ORDER — SODIUM CHLORIDE 0.9% IV SOLUTION: Freq: Once | INTRAVENOUS | Status: AC

## 2022-04-06 MED ORDER — RENA-VITE PO TABS
1.0000 | ORAL_TABLET | Freq: Every day | ORAL | Status: DC
Start: 2022-04-06 — End: 2022-04-10
  Administered 2022-04-06 – 2022-04-09 (×4): 1 via ORAL
  Filled 2022-04-06 (×4): qty 1

## 2022-04-06 NOTE — Progress Notes (Signed)
Patient off floor at shift change in HD. Will assess patient upon return. Nancy Emerald, RN ? ? ?

## 2022-04-06 NOTE — Progress Notes (Signed)
Re-check of hgbl, has critical result of 6.7, on-call physician notified. Patient is alert with no complaints and no obvious bleeding.Will continue to monitor. ?

## 2022-04-06 NOTE — Progress Notes (Signed)
Received critical hgb of 6.9 down from earlier draw of 10.2 requesting re-draw.  ?

## 2022-04-06 NOTE — Progress Notes (Signed)
?  Progress Note ? ? ?Patient: Nancy [name] DOB: 1961/06/23 DOA: 03/29/2022     8 ?DOS: the patient was seen and examined on 04/06/2022 at 11:07AM ?  ? ? ? ?Brief hospital course: ?Nancy [name] is a 61 y.o. F with ESRD on HD, DM, neuropathy, dCHF, HTN, HLD, PVD, hx CVA, CAD, and obesity BMI 36 who was sent from wound clinic due to swelling, drainage from her chronically infected heel wound.   ? ? ?4/13: Admitted on antibiotics, found to have exposed calcaneous with extensive surrounding tissue loss ?4/14: Vascular surgery consulted, given above, no role for limb salvage ?4/15: Orthopedics consulted, recommend bilateral BKAs ?4/17: Family and patient decide to proceed with amputation ?4/19: To OR for bilateral amputation by Dr. Sharol Given ? ? ? ? ?Assessment and Plan: ?* Diabetic foot ulcers ([place]) ?Gangrenous ulcers of both heels with cellulitis and osteomyelitis of left calcaneus ?Peripheral vascular disease ?S/P bilateral BKA on 4/19 by Dr. Sharol Given ? ?Completed 24 hours post-op Abx ?- PT eval ?- TOC consult ? ? ? ?  ?ESRD on dialysis [facility]) ?- Consult Nephrology for routine HD ?- Continue Phoslo ? ?Insulin dependent type 2 diabetes mellitus ([place]) ?Presented with a glucose level greater than 400.  HbA1c 6.3 in March. ? ?Glucose improved today ?- Continue Glargine ?- Continue SS corrections ?- Continue mealtime insulin ?- Continue statin ? ?Anemia of chronic kidney disease with superimposed mild acute blood loss anemia ?Hgb had been around 10 on admission, near baseline.  ?Today down to 6.7 post-op in setting of wound vac. ?- Transfuse 1 unit ?- Threshold 7 g/dL  ? ?HTN (hypertension) ?BP controlled overnight ?- Continue metoprolol ?- Hold home amlodipine ? ? ? ? ? ? ? ? ? ? ?Subjective: Patient has some mild pain at the site of her stump, well controlled with pain medicine.  No fever, no dyspnea, no sputum. ? ? ? ? ?Physical Exam: ?Vitals:  ? 04/06/22 1041 04/06/22 1052 04/06/22 1058 04/06/22 1135  ?BP: (!)  141/72 (!) 142/97 (!) 153/78 (!) 149/84  ?Pulse: 72 71 81 82  ?Resp: '14  15 12  '$ ?Temp: 97.6 ?F (36.4 ?C)  97.8 ?F (36.6 ?C) 97.7 ?F (36.5 ?C)  ?TempSrc: Oral  Oral Oral  ?SpO2: 99%  100% 100%  ?Weight:   102.2 kg   ?Height:      ? ?Adult female, lying in bed, in the dialysis chair, interactive, appropriate ?RRR, no murmurs, no peripheral edema ?Both lower extremities stumps are in post-op apparatus.  Attention normal, psychomotor slowing is baseline, speech is slightly dysarthric at baseline ? ?Data Reviewed: ?Nephrology notes reviewed, nursing notes reviewed.  ?Hemogram shows Hgb  down to 6.7 overnight ?Na 133 no change ? ?Family Communication:  ? ? ? ? ?Disposition: ?Status is: Inpatient ? ? ? ? ? ? ? ? ?Author: ?Edwin Dada, MD ?04/06/2022 3:07 PM ? ?For on call review [email or web address].  ? ? ?

## 2022-04-06 NOTE — Progress Notes (Signed)
?   04/06/22 1058  ?Vitals  ?Temp 97.8 ?F (36.6 ?C)  ?Temp Source Oral  ?BP (!) 153/78  ?BP Location Left Arm  ?BP Method Automatic  ?Patient Position (if appropriate) Lying  ?Pulse Rate 81  ?Pulse Rate Source Monitor  ?Resp 15  ?Oxygen Therapy  ?SpO2 100 %  ?O2 Device Room Air  ?Dialysis Weight  ?Weight 102.2 kg  ?Type of Weight Post-Dialysis  ?Post-Hemodialysis Assessment  ?Rinseback Volume (mL) 250 mL  ?KECN 268 V  ?Dialyzer Clearance Lightly streaked  ?Duration of HD Treatment -hour(s) 3.5 hour(s)  ?Hemodialysis Intake (mL) 1100 mL  ?UF Total -Machine (mL) 3205 mL  ?Net UF (mL) 2105 mL  ?Tolerated HD Treatment Yes  ?Post-Hemodialysis Comments tx complete, pt stable  ?Hemodialysis Catheter Right Internal jugular Double lumen Permanent (Tunneled)  ?Placement Date/Time: 02/15/22 1333   Time Out: Correct patient;Correct site;Correct procedure  Maximum sterile barrier precautions: Hand hygiene;Cap;Mask;Sterile gown;Sterile gloves;Large sterile sheet  Site Prep: Chlorhexidine (preferred)  Local Anes...  ?Site Condition No complications  ?Blue Lumen Status Flushed;Dead end cap in place;Heparin locked  ?Red Lumen Status Flushed;Dead end cap in place;Heparin locked  ?Catheter fill solution Heparin 1000 units/ml  ?Catheter fill volume (Arterial) 1.6 cc  ?Catheter fill volume (Venous) 1.6  ?Dressing Type Transparent  ?Dressing Status Antimicrobial disc in place  ?Dressing Change Due 04/10/22  ?Post treatment catheter status Capped and Clamped  ? ?HD tx complete, received PRBC 2units during dialysis, no adverse events noted.  ?

## 2022-04-06 NOTE — Progress Notes (Signed)
TRH night cross cover note: ? ?I was notified by RN of this morning's lab results notable for hemoglobin of 6.7, which was redrawn and verified to be accurate.  This is in the context of a history of end-stage renal disease on hemodialysis on Monday, Wednesday, Friday, and complicated by anemia of chronic kidney disease with baseline hemoglobin appearing to be 8-9.  ? ?Per chart review, hemoglobin noted to be 6.2 on 03/30/2022, with most recent prior hemoglobin noted to be 10 when checked yesterday morning. ? ?Patient without any reported associated acute symptoms.  Per my review of vital signs, appears hemodynamically stable, with most recent blood pressure noted to be 127/98, with heart rate 74. ? ?The patient is scheduled for next routine hemodialysis session this morning, at which time it is anticipated this hemoglobin finding will be addressed.  ? ? ?Babs Bertin, DO ?Hospitalist ? ?

## 2022-04-06 NOTE — Progress Notes (Signed)
?[facility] ?Progress Note  ? ?Subjective:   Patient seen and examined at bedside in dialysis.  Hemoglobin dropped overnight and getting 2 units pRBC on HD this AM.  Denies bleeding, SOB, CP, dizziness and fatigue.   ? ?Objective ?Vitals:  ? 04/06/22 0900 04/06/22 0918 04/06/22 0930 04/06/22 0942  ?BP: 99/75 124/64 98/77 121/78  ?Pulse: 69 68 (!) 59 75  ?Resp: '14 15 10 14  '$ ?Temp:  97.8 ?F (36.6 ?C)  97.6 ?F (36.4 ?C)  ?TempSrc:  Oral  Oral  ?SpO2:  100%  100%  ?Weight:      ?Height:      ? ?Physical Exam ?General:chronically ill appearing female in NAD ?Heart:RRR, no mrg ?Lungs:CTAB, nml WOB on RA ?Abdomen:obese, soft, NTND, +BS ?Extremities:1+ b/l LE edema ?Dialysis Access: [facility] in use, RU AVF +b/t  ? ?Filed Weights  ? 04/05/22 0337 04/06/22 0543 04/06/22 0704  ?Weight: 99.4 kg 109.7 kg 104.2 kg  ? ? ?Intake/Output Summary (Last 24 hours) at 04/06/2022 0944 ?Last data filed at 04/06/2022 [phone] ?Gross per 24 hour  ?Intake 720 ml  ?Output --  ?Net 720 ml  ? ? ?Additional Objective ?Labs: ?Basic Metabolic Panel: ?Recent Labs  ?Lab 04/03/22 ?1043 04/04/22 ?0136 04/05/22 ?0136 04/06/22 ?0145  ?NA 134* 132* 132* 133*  ?K 4.0 4.2 6.0* 4.4  ?CL 96* 98 99 97*  ?CO2 28 22 21* 24  ?GLUCOSE 99 114* 282* 162*  ?BUN 19 12 24* 34*  ?CREATININE 3.88* 2.95* 4.54* 5.62*  ?CALCIUM 8.1* 7.9* 8.0* 8.2*  ?PHOS 4.3  --   --   --   ? ?Liver Function Tests: ?Recent Labs  ?Lab 04/03/22 ?1043 04/04/22 ?0136  ?AST  --  16  ?ALT  --  14  ?ALKPHOS  --  59  ?BILITOT  --  0.6  ?PROT  --  6.3*  ?ALBUMIN 1.6* 1.8*  ? ?CBC: ?Recent Labs  ?Lab 04/03/22 ?0326 04/03/22 ?1042 04/04/22 ?2683 04/05/22 ?0136 04/06/22 ?0145 04/06/22 ?4196  ?WBC 17.1* 17.0* 16.7* 13.9* 16.0*  --   ?NEUTROABS  --  12.9* 12.4*  --   --   --   ?HGB 9.2* 8.5* 9.2* 10.2* 6.9* 6.7*  ?HCT 30.4* 28.2* 29.9* 33.0* 22.4* 22.1*  ?MCV 89.7 90.7 89.5 89.7 91.4  --   ?PLT 322 323 326 322 297  --   ? ?Blood Culture ?   ?Component Value Date/Time  ? SDES BLOOD LEFT ANTECUBITAL  03/29/2022 1839  ? SPECREQUEST  03/29/2022 1839  ?  BOTTLES DRAWN AEROBIC ONLY Blood Culture adequate volume  ? CULT  03/29/2022 1839  ?  NO GROWTH 5 DAYS ?Performed at [facility], [place] [address] [place], [place] [address] ?  ? REPTSTATUS 04/03/2022 FINAL 03/29/2022 1839  ? ?CBG: ?Recent Labs  ?Lab 04/05/22 ?1057 04/05/22 ?1647 04/05/22 ?2100 04/06/22 ?0028 04/06/22 ?0559  ?GLUCAP 261* 188* 167* 183* 145*  ? ?Medications: ? magnesium sulfate bolus IVPB    ? ? sodium chloride   Intravenous Once  ? sodium chloride   Intravenous Once  ? acetaminophen  1,000 mg Oral TID  ? vitamin C  1,000 mg Oral Daily  ? atorvastatin  40 mg Oral Daily  ? calcium acetate  667 mg Oral TID WC  ? Chlorhexidine Gluconate Cloth  6 each Topical Q0600  ? docusate sodium  100 mg Oral Daily  ? ezetimibe  10 mg Oral Daily  ? heparin injection (subcutaneous)  5,000 Units Subcutaneous Q8H  ? insulin  aspart  0-6 Units Subcutaneous Q4H  ? insulin aspart  2 Units Subcutaneous TID WC  ? insulin glargine-yfgn  8 Units Subcutaneous QHS  ? nutrition supplement (JUVEN)  1 packet Oral BID BM  ? pantoprazole  40 mg Oral Daily  ? scopolamine  1 patch Transdermal Q72H  ? zinc sulfate  220 mg Oral Daily  ? ? ?Dialysis Orders: ?[place] MWF ?  4h 66mn  400/1.5   95.5kg  2/2 bath  TDC (RUA AVF healing) Hep 2500 ? - 4/14 >  hep B Ag neg and hep B Abs were low / not protective  ? - last Hb 6.9 on 4/12 ? - mircera 200 q2, last 4/12, due 4/26 ? - venofer 100 x 10, 4 left ? - compliance= A, 2-4 kg ave uf ?   ? Home meds include - asa, lipitor, phoslo 1 ac, lantus and lispro insulin, norvasc 10, coreg 18.75 bid, risperdal 3 mg hs, prns/ vits/ supps ?  ?Assessment/ Plan: ?B foot/ heel ulcers w/ osteomyelitis + severe pvd: per vascular not candidate for revascularization.  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 DSharol Given wound vac in place. Plan for SNF. S/p Vancomycin. On Zosyn. ?ESRD - on HD MWF. HD today per regular schedule.  ?Vol/ HTN - BP in goal without home meds. Lower  extremity edema on exam. If weights correct 9kg over dry. UF as tolerated.  Will need lower dry weight on d/c post surgery.  ?Anemia ckd - Hgb initially drop 6.2 on admit, improved to 9-10 s/p 2 units pRBC.  Dropped again overnight to 6.7, getting 2 units pRBC with HD today. Was getting IV fe and ESA at op unit. Next esa due 4/26. No IV Fe w/ acute infection, once ABX completed will restart IV iron.  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  Resume binder.  ?DM2 - on insulin, per pmd ?Nutrition - renal diet w/fluid restrictions. Alb 1.8, +protein supplements, renal vit ? ?LJen Mow PA-C ?[facility] ?04/06/2022,9:44 AM ? LOS: 8 days  ? ? ?

## 2022-04-07 DIAGNOSIS — E11621 Type 2 diabetes mellitus with foot ulcer: Secondary | ICD-10-CM | POA: Diagnosis not present

## 2022-04-07 DIAGNOSIS — L03116 Cellulitis of left lower limb: Secondary | ICD-10-CM | POA: Diagnosis not present

## 2022-04-07 DIAGNOSIS — D649 Anemia, unspecified: Secondary | ICD-10-CM | POA: Diagnosis not present

## 2022-04-07 DIAGNOSIS — I2510 Atherosclerotic heart disease of native coronary artery without angina pectoris: Secondary | ICD-10-CM | POA: Diagnosis not present

## 2022-04-07 LAB — CBC
HCT: 31.8 % — ABNORMAL LOW (ref 36.0–46.0)
Hemoglobin: 10.3 g/dL — ABNORMAL LOW (ref 12.0–15.0)
MCH: 28.8 pg (ref 26.0–34.0)
MCHC: 32.4 g/dL (ref 30.0–36.0)
MCV: 88.8 fL (ref 80.0–100.0)
Platelets: 286 10*3/uL (ref 150–400)
RBC: 3.58 MIL/uL — ABNORMAL LOW (ref 3.87–5.11)
RDW: 15.5 % (ref 11.5–15.5)
WBC: 15 10*3/uL — ABNORMAL HIGH (ref 4.0–10.5)
nRBC: 0 % (ref 0.0–0.2)

## 2022-04-07 LAB — TYPE AND SCREEN
ABO/RH(D): A POS
Antibody Screen: NEGATIVE
Unit division: 0
Unit division: 0

## 2022-04-07 LAB — GLUCOSE, CAPILLARY
Glucose-Capillary: 112 mg/dL — ABNORMAL HIGH (ref 70–99)
Glucose-Capillary: 182 mg/dL — ABNORMAL HIGH (ref 70–99)
Glucose-Capillary: 194 mg/dL — ABNORMAL HIGH (ref 70–99)
Glucose-Capillary: 189 mg/dL — ABNORMAL HIGH (ref 70–99)

## 2022-04-07 LAB — BPAM RBC
Blood Product Expiration Date: 202305062359
Blood Product Expiration Date: 202305062359
ISSUE DATE / TIME: 202304210856
ISSUE DATE / TIME: 202304210856
Unit Type and Rh: 6200
Unit Type and Rh: 6200

## 2022-04-07 MED ORDER — SODIUM CHLORIDE 0.9 % IV SOLN
125.0000 mg | INTRAVENOUS | Status: DC
  Administered 2022-04-09: 125 mg via INTRAVENOUS
  Filled 2022-04-07 (×3): qty 10

## 2022-04-07 NOTE — Progress Notes (Signed)
Physical Therapy Treatment ?Patient Details ?Name: Nancy [name] ?MRN: [id number] ?DOB: 10/02/1961 ?Today's Date: 04/07/2022 ? ? ?History of Present Illness Pt is a 61 y/o female admitted 03/29/22 for surgical mgmt of necrotic abscesses of bilateral feet. 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 PMH: DM2, CVA, ESRD on HD, PVD, HTN, CHF, neuropathy, anemia, depression, and obesity. ? ?  ?PT Comments  ? ? Pt with improved mobility this session able to perform HEP, transition to long sitting and transfer to chair. Noted AIR declined and updated D/C rec to reflect change in plan. Will continue to follow to progress function and mobility.  ?   ?Recommendations for follow up therapy are one component of a multi-disciplinary discharge planning process, led by the attending physician.  Recommendations may be updated based on patient status, additional functional criteria and insurance authorization. ? ?Follow Up Recommendations ? Skilled nursing-short term rehab (<3 hours/day) ?  ?  ?Assistance Recommended at Discharge Frequent or constant Supervision/Assistance  ?Patient can return home with the following Two people to help with walking and/or transfers;A lot of help with bathing/dressing/bathroom;Assistance with cooking/housework;Direct supervision/assist for medications management;Direct supervision/assist for financial management;Assist for transportation ?  ?Equipment Recommendations ? Hospital bed;Wheelchair (measurements PT);Wheelchair cushion (measurements PT)  ?  ?Recommendations for Other Services   ? ? ?  ?Precautions / Restrictions Precautions ?Precautions: Fall;Other (comment) ?Precaution Comments: Bil BKA with wound vacs/limb protectors  ?  ? ?Mobility ? Bed Mobility ?Overal bed mobility: Needs Assistance ?Bed Mobility: Supine to Sit ?  ?  ?Supine to sit: Min assist ?  ?  ?General bed mobility comments: bed flat with transition to long sitting with min assist of hand to elevate to sitting. Mod+2  for pivot to EOb in sitting ?  ? ?Transfers ?Overall transfer level: Needs assistance ?  ?Transfers: Bed to chair/wheelchair/BSC ?  ?  ?  ?  ?Anterior-Posterior transfers: +2 physical assistance, Mod assist ?  ?General transfer comment: mod +2 scoot with pad for A/P transfer from bed to chair ?  ? ?Ambulation/Gait ?  ?  ?  ?  ?  ?  ?  ?  ? ? ?Stairs ?  ?  ?  ?  ?  ? ? ?Wheelchair Mobility ?  ? ?Modified Rankin (Stroke Patients Only) ?  ? ? ?  ?Balance   ?Sitting-balance support: No upper extremity supported ?Sitting balance-Leahy Scale: Fair ?Sitting balance - Comments: static long sitting without LOB ?  ?  ?  ?  ?  ?  ?  ?  ?  ?  ?  ?  ?  ?  ?  ?  ? ?  ?Cognition Arousal/Alertness: Awake/alert ?Behavior During Therapy: Flat affect ?Overall Cognitive Status: Impaired/Different from baseline ?Area of Impairment: Safety/judgement ?  ?  ?  ?  ?  ?  ?  ?  ?  ?  ?  ?  ?Safety/Judgement: Decreased awareness of safety ?  ?  ?General Comments: HOH with decreased awareness of deficits needing repetition for most cues, ?  ?  ? ?  ?Exercises Amputee Exercises ?Hip Extension:  (attempted sidelying hip extension but pt unable to sequence activity) ?Hip ABduction/ADduction: AROM, Both, 10 reps, Supine ?Straight Leg Raises: AROM, Both, Supine, 10 reps ? ?  ?General Comments   ?  ?  ? ?Pertinent Vitals/Pain Pain Assessment ?Faces Pain Scale: Hurts even more ?Pain Descriptors / Indicators: Sore ?Pain Intervention(s): Limited activity within patient's tolerance, Monitored during session, RN gave pain  meds during session, Repositioned  ? ? ?Home Living   ?  ?  ?  ?  ?  ?  ?  ?  ?  ?   ?  ?Prior Function    ?  ?  ?   ? ?PT Goals (current goals can now be found in the care plan section) Progress towards PT goals: Progressing toward goals ? ?  ?Frequency ? ? ? Min 3X/week ? ? ? ?  ?PT Plan Discharge plan needs to be updated  ? ? ?Co-evaluation   ?  ?  ?  ?  ? ?  ?AM-PAC PT "6 Clicks" Mobility   ?Outcome Measure ? Help needed turning from  your back to your side while in a flat bed without using bedrails?: A Little ?Help needed moving from lying on your back to sitting on the side of a flat bed without using bedrails?: A Little ?Help needed moving to and from a bed to a chair (including a wheelchair)?: Total ?Help needed standing up from a chair using your arms (e.g., wheelchair or bedside chair)?: Total ?Help needed to walk in hospital room?: Total ?Help needed climbing 3-5 steps with a railing? : Total ?6 Click Score: 10 ? ?  ?End of Session   ?Activity Tolerance: Patient tolerated treatment well ?Patient left: in chair;with call bell/phone within reach;with chair alarm set ?Nurse Communication: Mobility status (educated for A/P transfer) ?PT Visit Diagnosis: Other abnormalities of gait and mobility (R26.89);Muscle weakness (generalized) (M62.81) ?  ? ? ?Time: 8299-3716 ?PT Time Calculation (min) (ACUTE ONLY): 31 min ? ?Charges:  $Therapeutic Exercise: 8-22 mins ?$Therapeutic Activity: 8-22 mins          ?          ? ?Ottis Sarnowski P, PT ?Acute Rehabilitation Services ?Pager: [phone] ?Office: [phone] ? ? ? ?[name] ?04/07/2022, 10:08 AM ? ?

## 2022-04-07 NOTE — Progress Notes (Signed)
?  Progress Note ? ? ?Patient: Nancy [name] BTY:[id number] DOB: 1961-04-26 DOA: 03/29/2022     9 ?DOS: the patient was seen and examined on 04/07/2022 at 12:30PM ?  ? ? ? ?Brief hospital course: ?Mrs. Blowe is a 61 y.o. F with ESRD on HD, DM, neuropathy, dCHF, HTN, HLD, PVD, hx CVA, CAD, and obesity BMI 36 who was sent from wound clinic due to swelling, drainage from her chronically infected heel wound.   ? ? ?4/13: Admitted on antibiotics, found to have exposed calcaneous with extensive surrounding tissue loss ?4/14: Vascular surgery consulted, given above, no role for limb salvage ?4/15: Orthopedics consulted, recommend bilateral BKAs ?4/17: Family and patient decide to proceed with amputation ?4/19: To OR for bilateral amputation by Dr. Sharol Given ? ? ? ? 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 Sharol Given ? ?  ?- PT eval ?- TOC consult ? ? ? ?  ? ?ESRD on dialysis [facility]) ?  ?- Consult Nephrology for routine HD ?- Continue Phoslo ?  ? ?Chronic diastolic CHF (congestive heart failure) ([place]) ? Euvolemic, asymptomatic ?- Volume management per dialysis ? ?Insulin dependent type 2 diabetes mellitus ([place]) ?Glucose normal ?- Continue Glargine ?- Continue SS corrections ?- Continue mealtime insulin ?- Continue statin ?  ? ?Chronic anemia ?Transfused yesterday, Hgb up to normal today ? ? ?Peripheral artery disease ([place]) ?-Continue statin ? ?HTN (hypertension) ?BP controlled  ?- Continue metoprolol ?- Hold home amlodipine ? ? ? ? ? ? ? ? ?Subjective: No complaints.  Pain is well controlled, no fever, respiratory distress, vomiting, diarrhea ? ? ? ? ?Physical Exam: ?Vitals:  ? 04/06/22 2020 04/07/22 0349 04/07/22 0814 04/07/22 1227  ?BP: 116/70 (!) 146/56 (!) 153/66 (!) 151/64  ?Pulse: 80 79 84 75  ?Resp: '12 14 14 12  '$ ?Temp: 98.1 ?F (36.7 ?C) 97.8 ?F (36.6 ?C) 98.6 ?F (37 ?C)  98.1 ?F (36.7 ?C)  ?TempSrc: Oral Oral Oral Oral  ?SpO2: 100% 100% 100% 100%  ?Weight:  110.2 kg    ?Height:      ? ?Adult female, sitting up in recliner, no acute distress, interactive ?RRR, no murmurs, no peripheral edema ?Lungs clear without rales or wheezes ?  ? ?Data Reviewed: ?Nephrology notes reviewed, vital signs reviewed, nursing notes reviewed ?Labs are notable for hemoglobin up to 10, white blood cell count stable at 15 ? ?Family Communication:   ? ? ? ?Disposition: ?Status is: Inpatient ?The patient has had bilateral lower extremity amputation.  She will need placement for rehabilitation and acclimating to her new functional limitations ? ?She is medically cleared for discharge when a bed is available ? ? ? ? ? ? ? ?Author: ?Edwin Dada, MD ?04/07/2022 2:50 PM ? ?For on call review [email or web address].  ? ? ?

## 2022-04-07 NOTE — [place] FL2 (Signed)
?[place] MEDICAID FL2 LEVEL OF CARE SCREENING TOOL  ?  ? ?IDENTIFICATION  ?Patient Name: ?Nancy [name] Birthdate: 08-02-1961 Sex: female Admission Date (Current Location): ?03/29/2022  ?[place] and [place] Number: ? [place] ?  Facility and Address:  ?[facility] [place], [place] [address] [place],  [address] ?     Provider Number: ?[id number]  ?Attending Physician Name and Address:  ?Edwin Dada, * ? Relative Name and Phone Number:  ?[name] [phone] ?   ?Current Level of Care: ?Hospital Recommended Level of Care: ?[place] Prior Approval Number: ?  ? ?Date Approved/Denied: ?  PASRR Number: ?Pending ? ?Discharge Plan: ?SNF ?  ? ?Current Diagnoses: ?Patient Active Problem List  ? Diagnosis Date Noted  ? Hyponatremia 04/04/2022  ? Prolonged QT interval 04/04/2022  ? Pressure injury of skin 02/19/2022  ? Renal anasarca 02/14/2022  ? ESRD on dialysis [facility]) 02/14/2022  ? Hyperkalemia 02/14/2022  ? S/P arteriovenous (AV) fistula creation 12/22/2021  ? Critical limb ischemia of both lower extremities ([place]) 11/22/2021  ? Subacute osteomyelitis, right ankle and foot ([place])   ? Peripheral artery disease ([place]) 10/27/2021  ? Acute exacerbation of congestive heart failure ([place]) 10/24/2021  ? Acute cardiogenic pulmonary edema ([place]) 10/06/2021  ? CAD (coronary artery disease) 10/06/2021  ? Goals of care, counseling/discussion   ? Gangrene of left foot ([place])   ? Cellulitis of left foot   ? Diabetic foot ulcers ([place]) 09/04/2021  ? CHF (congestive heart failure) ([place]) 07/07/2021  ? Chronic anemia 06/30/2021  ? CKD (chronic kidney disease), stage IV ([place]) 06/11/2021  ? Chronic diastolic CHF (congestive heart failure) ([place]) 06/10/2021  ? Abnormal nuclear stress test   ? Dyspnea on exertion 03/07/2021  ? Orthopnea 02/25/2021  ? History of cerebrovascular accident (CVA) with residual deficit 05/06/2020  ? Diabetic peripheral neuropathy ([place]) 04/26/2020  ? AKI (acute kidney  injury) ([place]) 04/20/2020  ? Generalized weakness 04/20/2020  ? Dehydration 04/20/2020  ? Generalized abdominal pain   ? Pancreatitis, acute 02/29/2020  ? Cerebrovascular accident (CVA) ([place]) 11/08/2019  ? Stenosis of right carotid artery 11/08/2019  ? Mixed diabetic hyperlipidemia associated with type 2 diabetes mellitus ([place]) 11/08/2019  ? Cortical age-related cataract of both eyes 08/30/2019  ? Gait abnormality 08/30/2019  ? Statin declined 08/30/2019  ? Gastroesophageal reflux disease without esophagitis 04/18/2018  ? 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 Insulin dependent type 2 diabetes mellitus ([place]) 10/29/2017  ? History of macular degeneration 10/29/2017  ? Chronic cervical pain 10/29/2016  ? Myalgia 09/14/2016  ? Insomnia 09/14/2016  ? Tinea pedis of both feet 08/20/2016  ? Macular degeneration 08/17/2016  ? Low back pain 09/21/2014  ? Hypertensive urgency 09/21/2014  ? Diabetic gastroparesis associated with type 2 diabetes mellitus ([place]) 09/14/2014  ? HTN (hypertension) 09/08/2014  ? Thyroid nodule 08/17/2014  ? Morbid obesity ([place]) 08/17/2014  ? Hypokalemia 08/06/2014  ? Type II diabetes mellitus with neurological manifestations, uncontrolled 08/04/2014  ? ? ?Orientation RESPIRATION BLADDER Height & Weight   ?  ?Self, Time, Situation, Place ? Normal Continent, External catheter Weight: 242 lb 15.2 oz (110.2 kg) ?Height:  '5\' 3"'$  (160 cm)  ?BEHAVIORAL SYMPTOMS/MOOD NEUROLOGICAL BOWEL NUTRITION STATUS  ?    Continent Diet (See DC summary)  ?AMBULATORY STATUS COMMUNICATION OF NEEDS Skin   ?Extensive Assist Verbally Wound Vac, Surgical wounds, PU Stage and Appropriate Care (Left and right leg incisions, will need wound vacs) ?  ?  ?  ?    ?     ?     ? ? ?  Personal Care Assistance Level of Assistance  ?Bathing, Feeding, Dressing Bathing Assistance: Limited assistance ?Feeding assistance: Independent ?Dressing Assistance: Limited assistance ?   ? ?Functional Limitations Info  ?Sight, Hearing, Speech Sight Info:  Impaired ?Hearing Info: Impaired ?Speech Info: Adequate  ? ? ?SPECIAL CARE FACTORS FREQUENCY  ?PT (By licensed PT), OT (By licensed OT)   ?  ?PT Frequency: 5x per week ?OT Frequency: 5x per week ?  ?  ?  ?   ? ? ?Contractures Contractures Info: Not present  ? ? ?Additional Factors Info  ?Code Status, Allergies, Insulin Sliding Scale Code Status Info: DNR ?Allergies Info: Hydralazine Hcl, Hydrocodone, Metformin And Related, Other, Plaquenil (Hydroxychloroquine Sulfate), Shellfish-derived Products, Shrimp (Diagnostic), Sulfa Antibiotics ?  ?Insulin Sliding Scale Info: See DC summary ?  ?   ? ?Current Medications (04/07/2022):  This is the current hospital active medication list ?Current Facility-Administered Medications  ?Medication Dose Route Frequency Provider Last Rate Last Admin  ? 0.9 %  sodium chloride infusion (Manually program via Guardrails IV Fluids)   Intravenous Once Newt Minion, MD      ? 0.9 %  sodium chloride infusion (Manually program via Guardrails IV Fluids)   Intravenous Once Newt Minion, MD      ? acetaminophen (TYLENOL) tablet 1,000 mg  1,000 mg Oral TID Edwin Dada, MD   1,000 mg at 04/07/22 1025  ? ascorbic acid (VITAMIN C) tablet 1,000 mg  1,000 mg Oral Daily Newt Minion, MD   1,000 mg at 04/07/22 8527  ? atorvastatin (LIPITOR) tablet 40 mg  40 mg Oral Daily Newt Minion, MD   40 mg at 04/07/22 7824  ? bisacodyl (DULCOLAX) EC tablet 5 mg  5 mg Oral Daily PRN Newt Minion, MD   5 mg at 04/05/22 1056  ? calcium acetate (PHOSLO) capsule 667 mg  667 mg Oral TID WC Newt Minion, MD   667 mg at 04/07/22 1402  ? Chlorhexidine Gluconate Cloth 2 % PADS 6 each  6 each Topical Q0600 Newt Minion, MD   6 each at 04/07/22 0919  ? diphenhydrAMINE (BENADRYL) capsule 25 mg  25 mg Oral Q8H PRN Newt Minion, MD   25 mg at 04/06/22 2327  ? docusate sodium (COLACE) capsule 100 mg  100 mg Oral Daily Newt Minion, MD   100 mg at 04/07/22 2353  ? ezetimibe (ZETIA) tablet 10 mg  10 mg  Oral Daily Edwin Dada, MD   10 mg at 04/07/22 0911  ? [START ON 04/09/2022] ferric gluconate (FERRLECIT) 125 mg in sodium chloride 0.9 % 100 mL IVPB  125 mg Intravenous Q M,W,F-HD Penninger, Ria Comment, PA      ? guaiFENesin-dextromethorphan (ROBITUSSIN DM) 100-10 MG/5ML syrup 15 mL  15 mL Oral Q4H PRN Newt Minion, MD      ? heparin injection 5,000 Units  5,000 Units Subcutaneous Q8H Newt Minion, MD   5,000 Units at 04/07/22 1402  ? HYDROmorphone (DILAUDID) tablet 2-3 mg  2-3 mg Oral Q4H PRN Newt Minion, MD   2 mg at 04/07/22 6144  ? insulin aspart (novoLOG) injection 0-6 Units  0-6 Units Subcutaneous Q4H Newt Minion, MD   1 Units at 04/07/22 1403  ? insulin aspart (novoLOG) injection 2 Units  2 Units Subcutaneous TID WC Edwin Dada, MD   2 Units at 04/07/22 1402  ? insulin glargine-yfgn (SEMGLEE) injection 8 Units  8 Units Subcutaneous QHS Newt Minion,  MD   8 Units at 04/06/22 2332  ? labetalol (NORMODYNE) injection 10 mg  10 mg Intravenous Q10 min PRN Newt Minion, MD      ? magnesium sulfate IVPB 2 g 50 mL  2 g Intravenous Daily PRN Newt Minion, MD      ? metoprolol tartrate (LOPRESSOR) injection 2-5 mg  2-5 mg Intravenous Q2H PRN Newt Minion, MD      ? multivitamin (RENA-VIT) tablet 1 tablet  1 tablet Oral QHS Penninger, [place], [place]   1 tablet at 04/06/22 2343  ? nutrition supplement (JUVEN) (JUVEN) powder packet 1 packet  1 packet Oral BID BM Newt Minion, MD   1 packet at 04/07/22 [phone]  ? pantoprazole (PROTONIX) EC tablet 40 mg  40 mg Oral Daily Newt Minion, MD   40 mg at 04/07/22 0911  ? phenol (CHLORASEPTIC) mouth spray 1 spray  1 spray Mouth/Throat PRN Newt Minion, MD      ? polyethylene glycol (MIRALAX / GLYCOLAX) packet 17 g  17 g Oral Daily PRN Newt Minion, MD      ? scopolamine (TRANSDERM-SCOP) 1 MG/3DAYS 1.5 mg  1 patch Transdermal Q72H Newt Minion, MD   1.5 mg at 04/04/22 2255  ? zinc sulfate capsule 220 mg  220 mg Oral Daily Newt Minion, MD    220 mg at 04/07/22 4097  ? ? ? ?Discharge Medications: ?Please see discharge summary for a list of discharge medications. ? ?Relevant Imaging Results: ? ?Relevant Lab Results: ? ? ?Additional Information ?SSN# [id number]

## 2022-04-07 NOTE — Progress Notes (Addendum)
?[facility] ?Progress Note  ? ?Subjective:   Patient seen and examined in room.  Sitting in bedside chair.  Tolerated dialysis well yesterday.  Denies CP, SOB, abdominal pain and n/v/d.  ? ?Objective ?Vitals:  ? 04/06/22 1559 04/06/22 2020 04/07/22 0349 04/07/22 4098  ?BP: 133/74 116/70 (!) 146/56 (!) 153/66  ?Pulse: 80 80 79 84  ?Resp: '16 12 14 14  '$ ?Temp: 98.2 ?F (36.8 ?C) 98.1 ?F (36.7 ?C) 97.8 ?F (36.6 ?C) 98.6 ?F (37 ?C)  ?TempSrc: Oral Oral Oral Oral  ?SpO2: 96% 100% 100% 100%  ?Weight:   110.2 kg   ?Height:      ? ?Physical Exam ?General:chronically ill appearing female in NAD ?Heart:RRR, no mrg ?Lungs:CTAB, nml WOB on RA ?Abdomen:soft, NTND ?Extremities:trace edema b/l LE ?Dialysis Access: [facility] and RU AVF +b/t  ? ?Filed Weights  ? 04/06/22 0704 04/06/22 1058 04/07/22 0349  ?Weight: 104.2 kg 102.2 kg 110.2 kg  ? ? ?Intake/Output Summary (Last 24 hours) at 04/07/2022 1111 ?Last data filed at 04/06/2022 1200 ?Gross per 24 hour  ?Intake --  ?Output 0 ml  ?Net 0 ml  ? ? ?Additional Objective ?Labs: ?Basic Metabolic Panel: ?Recent Labs  ?Lab 04/03/22 ?1043 04/04/22 ?0136 04/05/22 ?0136 04/06/22 ?0145  ?NA 134* 132* 132* 133*  ?K 4.0 4.2 6.0* 4.4  ?CL 96* 98 99 97*  ?CO2 28 22 21* 24  ?GLUCOSE 99 114* 282* 162*  ?BUN 19 12 24* 34*  ?CREATININE 3.88* 2.95* 4.54* 5.62*  ?CALCIUM 8.1* 7.9* 8.0* 8.2*  ?PHOS 4.3  --   --   --   ? ?Liver Function Tests: ?Recent Labs  ?Lab 04/03/22 ?1043 04/04/22 ?0136  ?AST  --  16  ?ALT  --  14  ?ALKPHOS  --  59  ?BILITOT  --  0.6  ?PROT  --  6.3*  ?ALBUMIN 1.6* 1.8*  ? ?CBC: ?Recent Labs  ?Lab 04/03/22 ?1042 04/04/22 ?1191 04/05/22 ?0136 04/06/22 ?0145 04/06/22 ?4782 04/07/22 ?0354  ?WBC 17.0* 16.7* 13.9* 16.0*  --  15.0*  ?NEUTROABS 12.9* 12.4*  --   --   --   --   ?HGB 8.5* 9.2* 10.2* 6.9* 6.7* 10.3*  ?HCT 28.2* 29.9* 33.0* 22.4* 22.1* 31.8*  ?MCV 90.7 89.5 89.7 91.4  --  88.8  ?PLT 323 326 322 297  --  286  ? ? ?CBG: ?Recent Labs  ?Lab 04/06/22 ?1138 04/06/22 ?1555  04/06/22 ?2023 04/06/22 ?2338 04/07/22 ?0819  ?GLUCAP 85 150* 145* 161* 112*  ? ?Medications: ? magnesium sulfate bolus IVPB    ? ? sodium chloride   Intravenous Once  ? sodium chloride   Intravenous Once  ? acetaminophen  1,000 mg Oral TID  ? vitamin C  1,000 mg Oral Daily  ? atorvastatin  40 mg Oral Daily  ? calcium acetate  667 mg Oral TID WC  ? Chlorhexidine Gluconate Cloth  6 each Topical Q0600  ? docusate sodium  100 mg Oral Daily  ? ezetimibe  10 mg Oral Daily  ? heparin injection (subcutaneous)  5,000 Units Subcutaneous Q8H  ? insulin aspart  0-6 Units Subcutaneous Q4H  ? insulin aspart  2 Units Subcutaneous TID WC  ? insulin glargine-yfgn  8 Units Subcutaneous QHS  ? multivitamin  1 tablet Oral QHS  ? nutrition supplement (JUVEN)  1 packet Oral BID BM  ? pantoprazole  40 mg Oral Daily  ? scopolamine  1 patch Transdermal Q72H  ? zinc sulfate  220 mg Oral Daily  ? ? ?  Dialysis Orders: ?[facility] MWF ?  4h 11mn  400/1.5   95.5kg  2/2 bath  TDC (RUA AVF healing) Hep 2500 ? - 4/14 >  hep B Ag neg and hep B Abs were low / not protective  ? - last Hb 6.9 on 4/12 ? - mircera 200 q2, last 4/12, due 4/26 ? - venofer 100 x 10, 4 left ? - compliance= A, 2-4 kg ave uf ?   ? Home meds include - asa, lipitor, phoslo 1 ac, lantus and lispro insulin, norvasc 10, coreg 18.75 bid, risperdal 3 mg hs, prns/ vits/ supps ?  ?Assessment/ Plan: ?B foot/ heel ulcers w/ osteomyelitis + severe pvd: per vascular not candidate for revascularization.  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 DSharol Given Plan for SNF. S/p Vancomycin/Zosyn. ?ESRD - on HD MWF. Next HD on 4/24. ?Vol/ HTN - BP elevated today.  Home meds on hold.  Lower extremity edema on exam. If weights not close to dry.  Will try to maximize UF with next HD.  Will need lower dry weight on d/c post surgery.  ?Anemia ckd - Hgb initially drop 6.2 on admit, improved to 9-10 s/p 2 units pRBC.  Dropped again 4/21 to 6.7, nos improved to 10.3 s/p 2 units pRBC with HD yesterday. Was getting IV fe and ESA at op  unit. Next esa due 4/26. Will restart iron not that ABX completed.  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  Resume binder.  ?DM2 - on insulin, per pmd ?Nutrition - renal diet w/fluid restrictions. Alb 1.8, +protein supplements, renal vit ? ?LJen Mow PA-C ?[facility] ?04/07/2022,11:11 AM ? LOS: 9 days  ? ? ?

## 2022-04-07 NOTE — Social Work (Addendum)
To Whom It May Concern: ? ?Please be advised that the above-named patient will require a short-term nursing home stay - anticipated 30 days or less for rehabilitation and strengthening.  The plan is for return home. ? ? ?MD signature ? ?Date ?

## 2022-04-07 NOTE — Progress Notes (Signed)
Report called to Randall Hiss, RN on [employer]. Will be transferring her to bed 01 on [address].  ?

## 2022-04-07 NOTE — TOC Initial Note (Signed)
Transition of Care (TOC) - Initial/Assessment Note  ? ? ?Patient Details  ?Name: Nancy [name] ?MRN: [id number] ?Date of Birth: 1961-09-01 ? ?Transition of Care (TOC) CM/SW Contact:    ?Bary Castilla, LCSW ?Phone Number: [phone] ?04/07/2022, 1:29 PM ? ?Clinical Narrative:                 ? ?CSW met with patient to discuss PT recommendation of a SNF. Patient was aware of recommendation and in agreement with going to a ST SNF. CSW discussed the SNF process.CSW provided patient with medicare.gov rating list.  Patient gave CSW permission to fax referrals out to local facilities.CSW answered questions about the SNF process and the next steps in the process. ? ? Pt states that she has been to [facility] in the past and does not mind returning. Pt has been vaccinated. ? ?TOC team will continue to assist with discharge planning needs.   ? ?Expected Discharge Plan: [place] ?Barriers to Discharge: Ship broker, Continued Medical Work up, SNF Pending bed offer ? ? ?Patient Goals and CMS Choice ?Patient states their goals for this hospitalization and ongoing recovery are:: To be able to go home ?CMS Medicare.gov Compare Post Acute Care list provided to:: Patient ?Choice offered to / list presented to : Patient ? ?Expected Discharge Plan and Services ?Expected Discharge Plan: [place] ?  ?  ?  ?Living arrangements for the past 2 months: [place] ?                ?  ?  ?  ?  ?  ?  ?  ?  ?  ?  ? ?Prior Living Arrangements/Services ?Living arrangements for the past 2 months: [place] ?Lives with:: Self, Spouse ?Patient language and need for interpreter reviewed:: Yes ?       ?  ?Care giver support system in place?: Yes (comment) ?  ?  ? ?Activities of Daily Living ?Home Assistive Devices/Equipment: Wheelchair ?ADL Screening (condition at time of admission) ?Patient's cognitive ability adequate to safely complete daily activities?: Yes ?Is the patient deaf or  have difficulty hearing?: No ?Does the patient have difficulty seeing, even when wearing glasses/contacts?: No ?Does the patient have difficulty concentrating, remembering, or making decisions?: No ?Patient able to express need for assistance with ADLs?: Yes ?Does the patient have difficulty dressing or bathing?: Yes ?Independently performs ADLs?: No ?Communication: Needs assistance ?Is this a change from baseline?: Pre-admission baseline ?Does the patient have difficulty walking or climbing stairs?: Yes ?Weakness of Legs: Both ?Weakness of Arms/Hands: Both ? ?Permission Sought/Granted ?  ?  ? Share Information with NAME: [name] ? Permission granted to share info w AGENCY: SNFs ? Permission granted to share info w Relationship: Spouse ? Permission granted to share info w Contact Information: [phone] ? ?Emotional Assessment ?Appearance:: Appears older than stated age ?Attitude/Demeanor/Rapport: Engaged ?Affect (typically observed): Accepting, Adaptable ?Orientation: : Oriented to Self, Oriented to Place, Oriented to  Time, Oriented to Situation ?  ?  ? ?Admission diagnosis:  Osteomyelitis ([place]) [M86.9] ?Diabetic foot ulcers ([place]) [H96.222, L97.509] ?Osteomyelitis of left foot, unspecified type ([place]) [M86.9] ?Patient Active Problem List  ? Diagnosis Date Noted  ? Hyponatremia 04/04/2022  ? Prolonged QT interval 04/04/2022  ? Pressure injury of skin 02/19/2022  ? Renal anasarca 02/14/2022  ? ESRD on dialysis [facility]) 02/14/2022  ? Hyperkalemia 02/14/2022  ? S/P arteriovenous (AV) fistula creation 12/22/2021  ? Critical limb ischemia  of both lower extremities ([place]) 11/22/2021  ? Subacute osteomyelitis, right ankle and foot ([place])   ? Peripheral artery disease ([place]) 10/27/2021  ? Acute exacerbation of congestive heart failure ([place]) 10/24/2021  ? Acute cardiogenic pulmonary edema ([place]) 10/06/2021  ? CAD (coronary artery disease) 10/06/2021  ? Goals of care, counseling/discussion   ? Gangrene of left foot ([place])   ?  Cellulitis of left foot   ? Diabetic foot ulcers ([place]) 09/04/2021  ? CHF (congestive heart failure) ([place]) 07/07/2021  ? Chronic anemia 06/30/2021  ? CKD (chronic kidney disease), stage IV ([place]) 06/11/2021  ? Chronic diastolic CHF (congestive heart failure) ([place]) 06/10/2021  ? Abnormal nuclear stress test   ? Dyspnea on exertion 03/07/2021  ? Orthopnea 02/25/2021  ? History of cerebrovascular accident (CVA) with residual deficit 05/06/2020  ? Diabetic peripheral neuropathy ([place]) 04/26/2020  ? AKI (acute kidney injury) ([place]) 04/20/2020  ? Generalized weakness 04/20/2020  ? Dehydration 04/20/2020  ? Generalized abdominal pain   ? Pancreatitis, acute 02/29/2020  ? Cerebrovascular accident (CVA) ([place]) 11/08/2019  ? Stenosis of right carotid artery 11/08/2019  ? Mixed diabetic hyperlipidemia associated with type 2 diabetes mellitus ([place]) 11/08/2019  ? Cortical age-related cataract of both eyes 08/30/2019  ? Gait abnormality 08/30/2019  ? Statin declined 08/30/2019  ? Gastroesophageal reflux disease without esophagitis 04/18/2018  ? 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 Insulin dependent type 2 diabetes mellitus ([place]) 10/29/2017  ? History of macular degeneration 10/29/2017  ? Chronic cervical pain 10/29/2016  ? Myalgia 09/14/2016  ? Insomnia 09/14/2016  ? Tinea pedis of both feet 08/20/2016  ? Macular degeneration 08/17/2016  ? Low back pain 09/21/2014  ? Hypertensive urgency 09/21/2014  ? Diabetic gastroparesis associated with type 2 diabetes mellitus ([place]) 09/14/2014  ? HTN (hypertension) 09/08/2014  ? Thyroid nodule 08/17/2014  ? Morbid obesity ([place]) 08/17/2014  ? Hypokalemia 08/06/2014  ? Type II diabetes mellitus with neurological manifestations, uncontrolled 08/04/2014  ? ?PCP:  [name], MD ?Pharmacy:   ?[place] [place] [facility] [place] ?[address] ?[place] [address] ?Phone: [phone] Fax: [phone] ? ?[name] ?[address] ?[place] [address] ?Phone: [phone] Fax: [phone] ? ? ? ? ?Social Determinants of Health (SDOH) Interventions ?  ? ?Readmission Risk Interventions ? ?  02/16/2022  ? 11:55 AM 09/21/2021  ?  9:05 AM 03/07/2020  ?  2:36 PM  ?Readmission Risk Prevention Plan  ?Transportation Screening Complete Complete Complete  ?PCP or Specialist Appt within 3-5 Days   Complete  ?Social Work Consult for [place] Planning/Counseling   Complete  ?Palliative Care Screening   Not Applicable  ?Medication Review Press photographer) Complete Complete Complete  ?PCP or Specialist appointment within 3-5 days of discharge  Complete   ?[place] or Home Care Consult  Complete   ?SW Recovery Care/Counseling Consult  Complete   ?Palliative Care Screening  Complete   ?[place]  Not Applicable   ? ? ? ?

## 2022-04-08 DIAGNOSIS — E11621 Type 2 diabetes mellitus with foot ulcer: Secondary | ICD-10-CM | POA: Diagnosis not present

## 2022-04-08 DIAGNOSIS — I5032 Chronic diastolic (congestive) heart failure: Secondary | ICD-10-CM | POA: Diagnosis not present

## 2022-04-08 DIAGNOSIS — I10 Essential (primary) hypertension: Secondary | ICD-10-CM | POA: Diagnosis not present

## 2022-04-08 DIAGNOSIS — L03116 Cellulitis of left lower limb: Secondary | ICD-10-CM | POA: Diagnosis not present

## 2022-04-08 LAB — GLUCOSE, CAPILLARY
Glucose-Capillary: 124 mg/dL — ABNORMAL HIGH (ref 70–99)
Glucose-Capillary: 103 mg/dL — ABNORMAL HIGH (ref 70–99)
Glucose-Capillary: 169 mg/dL — ABNORMAL HIGH (ref 70–99)
Glucose-Capillary: 180 mg/dL — ABNORMAL HIGH (ref 70–99)
Glucose-Capillary: 225 mg/dL — ABNORMAL HIGH (ref 70–99)

## 2022-04-08 MED ORDER — INSULIN ASPART 100 UNIT/ML IJ SOLN
0.0000 [IU] | Freq: Every day | INTRAMUSCULAR | Status: DC
  Administered 2022-04-08: 2 [IU] via SUBCUTANEOUS
  Administered 2022-04-09: 3 [IU] via SUBCUTANEOUS

## 2022-04-08 MED ORDER — INSULIN ASPART 100 UNIT/ML IJ SOLN
0.0000 [IU] | Freq: Three times a day (TID) | INTRAMUSCULAR | Status: DC
  Administered 2022-04-08 (×2): 2 [IU] via SUBCUTANEOUS
  Administered 2022-04-09: 1 [IU] via SUBCUTANEOUS
  Administered 2022-04-09: 2 [IU] via SUBCUTANEOUS
  Administered 2022-04-10: 1 [IU] via SUBCUTANEOUS
  Administered 2022-04-10: 2 [IU] via SUBCUTANEOUS

## 2022-04-08 MED ORDER — CHLORHEXIDINE GLUCONATE CLOTH 2 % EX PADS
6.0000 | MEDICATED_PAD | Freq: Every day | CUTANEOUS | Status: DC
  Administered 2022-04-09 – 2022-04-10 (×2): 6 via TOPICAL

## 2022-04-08 MED ORDER — AMLODIPINE BESYLATE 5 MG PO TABS
5.0000 mg | ORAL_TABLET | Freq: Every day | ORAL | Status: DC
  Administered 2022-04-08 – 2022-04-10 (×3): 5 mg via ORAL
  Filled 2022-04-08 (×3): qty 1

## 2022-04-08 NOTE — Progress Notes (Signed)
?  Progress Note ? ? ?Patient: Nancy [name] JAS:[id number] DOB: April 11, 1961 DOA: 03/29/2022     10 ?DOS: the patient was seen and examined on 04/08/2022   ?  ? ? ? ?Brief hospital course: ?Nancy [name] is a 61 y.o. F with ESRD on HD, DM, neuropathy, dCHF, HTN, HLD, PVD, hx CVA, CAD, and obesity BMI 36 who was sent from wound clinic due to swelling, drainage from her chronically infected heel wound.   ? ? ?4/13: Admitted on antibiotics, found to have exposed calcaneous with extensive surrounding tissue loss ?4/14: Vascular surgery consulted, given above, no role for limb salvage ?4/15: Orthopedics consulted, recommend bilateral BKAs ?4/17: Family and patient decide to proceed with amputation ?4/19: To OR for bilateral amputation by Dr. Sharol Given ? ? ? ? ?Assessment and Plan: ? ?Chronic diastolic CHF ?Euvolemic ?- Per HD ? ?Diabetes ?Glucose normal ?- Contineu glargine, SS corrections, mealtime insulin, statin  ? ?PVD ?-Continue statin ? ?HTN ?BP high ?-Continue metoprolol ?- Rseume amlodipine ? ? ? ? ? ? ? ? ?Subjective: Patient is feeling well. ? ? ? ? ?Physical Exam: ?Vitals:  ? 04/07/22 1703 04/07/22 2015 04/08/22 0915 04/08/22 1559  ?BP: (!) 160/75 (!) 158/80 (!) 155/69 (!) 143/69  ?Pulse: 75 84 76 72  ?Resp: '12  17 17  '$ ?Temp: 98.2 ?F (36.8 ?C) 98.2 ?F (36.8 ?C) 98 ?F (36.7 ?C) 98.1 ?F (36.7 ?C)  ?TempSrc: Oral Oral Oral Oral  ?SpO2:  96% 100% 97%  ?Weight:      ?Height:      ? ?Adult female, sleeping, arouses easily, dentition poor, hands and globs, RRR, no murmurs, no peripheral edema, lungs clear without rales or wheezes, attentive to my questions, answers appropriately and falls back asleep. ? ?Data Reviewed: ?Glucose normal ? ? ?Family Communication:   ? ? ? ?Disposition: ?Status is: Inpatient ?The patient has had bilateral lower extremity amputation.  She will need placement for rehabilitation and acclimating to her new functional limitations ? ?She is medically cleared for discharge when a bed is  available ? ? ? ? ? ? ? ?Author: ?Edwin Dada, MD ?04/08/2022 4:05 PM ? ?For on call review [email or web address].  ? ? ?

## 2022-04-08 NOTE — Progress Notes (Addendum)
?[facility] ?Progress Note  ? ?Subjective:   Patient seen and examined in room.  Waiting for breakfast.  Admits to pain pain in LE but otherwise no complaints.  Denies CP, SOB, abdominal pain, n/v/d and fatigue.  ? ?Objective ?Vitals:  ? 04/07/22 1227 04/07/22 1703 04/07/22 2015 04/08/22 0915  ?BP: (!) 151/64 (!) 160/75 (!) 158/80 (!) 155/69  ?Pulse: 75 75 84 76  ?Resp: '12 12  17  '$ ?Temp: 98.1 ?F (36.7 ?C) 98.2 ?F (36.8 ?C) 98.2 ?F (36.8 ?C) 98 ?F (36.7 ?C)  ?TempSrc: Oral Oral Oral Oral  ?SpO2: 100%  96% 100%  ?Weight:      ?Height:      ? ?Physical Exam ?General:chronically ill appearing female in NAD ?Heart:RRR, no mrg ?Lungs:CTAB, nml WOB on RA ?Abdomen:soft, NTND ?Extremities:trace edema in b/l BKA ?Dialysis Access: [facility] and RU AVF +b/t  ? ?Filed Weights  ? 04/06/22 0704 04/06/22 1058 04/07/22 0349  ?Weight: 104.2 kg 102.2 kg 110.2 kg  ? ? ?Intake/Output Summary (Last 24 hours) at 04/08/2022 1037 ?Last data filed at 04/08/2022 0100 ?Gross per 24 hour  ?Intake --  ?Output 1 ml  ?Net -1 ml  ? ? ?Additional Objective ?Labs: ?Basic Metabolic Panel: ?Recent Labs  ?Lab 04/03/22 ?1043 04/04/22 ?0136 04/05/22 ?0136 04/06/22 ?0145  ?NA 134* 132* 132* 133*  ?K 4.0 4.2 6.0* 4.4  ?CL 96* 98 99 97*  ?CO2 28 22 21* 24  ?GLUCOSE 99 114* 282* 162*  ?BUN 19 12 24* 34*  ?CREATININE 3.88* 2.95* 4.54* 5.62*  ?CALCIUM 8.1* 7.9* 8.0* 8.2*  ?PHOS 4.3  --   --   --   ? ?Liver Function Tests: ?Recent Labs  ?Lab 04/03/22 ?1043 04/04/22 ?0136  ?AST  --  16  ?ALT  --  14  ?ALKPHOS  --  59  ?BILITOT  --  0.6  ?PROT  --  6.3*  ?ALBUMIN 1.6* 1.8*  ? ?CBC: ?Recent Labs  ?Lab 04/03/22 ?1042 04/04/22 ?5176 04/05/22 ?0136 04/06/22 ?0145 04/06/22 ?1607 04/07/22 ?0354  ?WBC 17.0* 16.7* 13.9* 16.0*  --  15.0*  ?NEUTROABS 12.9* 12.4*  --   --   --   --   ?HGB 8.5* 9.2* 10.2* 6.9* 6.7* 10.3*  ?HCT 28.2* 29.9* 33.0* 22.4* 22.1* 31.8*  ?MCV 90.7 89.5 89.7 91.4  --  88.8  ?PLT 323 326 322 297  --  286  ? ?CBG: ?Recent Labs  ?Lab  04/07/22 ?1402 04/07/22 ?1706 04/07/22 ?2021 04/08/22 ?3710 04/08/22 ?6269  ?GLUCAP 182* 194* 189* 124* 103*  ? ?Medications: ? [START ON 04/09/2022] ferric gluconate (FERRLECIT) IVPB    ? magnesium sulfate bolus IVPB    ? ? sodium chloride   Intravenous Once  ? sodium chloride   Intravenous Once  ? acetaminophen  1,000 mg Oral TID  ? vitamin C  1,000 mg Oral Daily  ? atorvastatin  40 mg Oral Daily  ? calcium acetate  667 mg Oral TID WC  ? Chlorhexidine Gluconate Cloth  6 each Topical Q0600  ? docusate sodium  100 mg Oral Daily  ? ezetimibe  10 mg Oral Daily  ? heparin injection (subcutaneous)  5,000 Units Subcutaneous Q8H  ? insulin aspart  0-6 Units Subcutaneous Q4H  ? insulin aspart  2 Units Subcutaneous TID WC  ? insulin glargine-yfgn  8 Units Subcutaneous QHS  ? multivitamin  1 tablet Oral QHS  ? nutrition supplement (JUVEN)  1 packet Oral BID BM  ? pantoprazole  40 mg Oral  Daily  ? scopolamine  1 patch Transdermal Q72H  ? zinc sulfate  220 mg Oral Daily  ? ? ?Dialysis Orders: ?[facility] MWF ?  4h 56mn  400/1.5   95.5kg  2/2 bath  TDC (RUA AVF healing) Hep 2500 ? - 4/14 >  hep B Ag neg and hep B Abs were low / not protective  ? - last Hb 6.9 on 4/12 ? - mircera 200 q2, last 4/12, due 4/26 ? - venofer 100 x 10, 4 left ? - compliance= A, 2-4 kg ave uf ?   ? Home meds include - asa, lipitor, phoslo 1 ac, lantus and lispro insulin, norvasc 10, coreg 18.75 bid, risperdal 3 mg hs, prns/ vits/ supps ?  ?Assessment/ Plan: ?B foot/ heel ulcers w/ osteomyelitis + severe pvd: per vascular not candidate for revascularization.  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 DSharol Given Plan for SNF. S/p Vancomycin/Zosyn. ?ESRD - on HD MWF. Next HD on 4/24. ?Vol/ HTN - BP elevated today.  Home meds on hold.  On metoprolol prn. Lower extremity edema on exam. If weights correct not close to dry.  Will try to maximize UF with next HD.  Will need lower dry weight on d/c post surgery.  ?Anemia ckd - Hgb initially drop 6.2 on admit, improved to 9-10 s/p 2 units  pRBC.  Dropped again 4/21 to 6.7, now improved to 10.3 s/p 2 units pRBC with HD 4/21. Was getting IV fe and ESA at op unit. Next esa due 4/26. Will restart iron now that ABX completed.  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  Resume binder.  ?DM2 - on insulin, per pmd ?Nutrition - renal diet w/fluid restrictions. Alb 1.8, +protein supplements, renal vit ? ?LJen Mow PA-C ?[facility] ?04/08/2022,10:37 AM ? LOS: 10 days  ? ? ?

## 2022-04-08 NOTE — Plan of Care (Signed)
  Problem: Education: Goal: Knowledge of General Education information will improve Description: Including pain rating scale, medication(s)/side effects and non-pharmacologic comfort measures Outcome: Progressing   Problem: Clinical Measurements: Goal: Will remain free from infection Outcome: Progressing Goal: Diagnostic test results will improve Outcome: Progressing   

## 2022-04-09 ENCOUNTER — Telehealth (HOSPITAL_COMMUNITY): Payer: Self-pay

## 2022-04-09 DIAGNOSIS — L03116 Cellulitis of left lower limb: Secondary | ICD-10-CM | POA: Diagnosis not present

## 2022-04-09 DIAGNOSIS — I2510 Atherosclerotic heart disease of native coronary artery without angina pectoris: Secondary | ICD-10-CM | POA: Diagnosis not present

## 2022-04-09 DIAGNOSIS — E11621 Type 2 diabetes mellitus with foot ulcer: Secondary | ICD-10-CM | POA: Diagnosis not present

## 2022-04-09 DIAGNOSIS — D649 Anemia, unspecified: Secondary | ICD-10-CM | POA: Diagnosis not present

## 2022-04-09 LAB — RENAL FUNCTION PANEL
Albumin: 1.9 g/dL — ABNORMAL LOW (ref 3.5–5.0)
Anion gap: 11 (ref 5–15)
BUN: 36 mg/dL — ABNORMAL HIGH (ref 8–23)
CO2: 25 mmol/L (ref 22–32)
Calcium: 8.4 mg/dL — ABNORMAL LOW (ref 8.9–10.3)
Chloride: 93 mmol/L — ABNORMAL LOW (ref 98–111)
Creatinine, Ser: 5.2 mg/dL — ABNORMAL HIGH (ref 0.44–1.00)
GFR, Estimated: 9 mL/min — ABNORMAL LOW (ref >60)
Glucose, Bld: 140 mg/dL — ABNORMAL HIGH (ref 70–99)
Phosphorus: 5.7 mg/dL — ABNORMAL HIGH (ref 2.5–4.6)
Potassium: 4.1 mmol/L (ref 3.5–5.1)
Sodium: 129 mmol/L — ABNORMAL LOW (ref 135–145)

## 2022-04-09 LAB — CBC
HCT: 29.9 % — ABNORMAL LOW (ref 36.0–46.0)
Hemoglobin: 9.8 g/dL — ABNORMAL LOW (ref 12.0–15.0)
MCH: 29.4 pg (ref 26.0–34.0)
MCHC: 32.8 g/dL (ref 30.0–36.0)
MCV: 89.8 fL (ref 80.0–100.0)
Platelets: 304 10*3/uL (ref 150–400)
RBC: 3.33 MIL/uL — ABNORMAL LOW (ref 3.87–5.11)
RDW: 15.2 % (ref 11.5–15.5)
WBC: 15.3 10*3/uL — ABNORMAL HIGH (ref 4.0–10.5)
nRBC: 0 % (ref 0.0–0.2)

## 2022-04-09 LAB — GLUCOSE, CAPILLARY
Glucose-Capillary: 144 mg/dL — ABNORMAL HIGH (ref 70–99)
Glucose-Capillary: 161 mg/dL — ABNORMAL HIGH (ref 70–99)
Glucose-Capillary: 255 mg/dL — ABNORMAL HIGH (ref 70–99)

## 2022-04-09 MED ORDER — SODIUM CHLORIDE 0.9 % IV SOLN: 100.0000 mL | INTRAVENOUS | Status: DC | PRN

## 2022-04-09 MED ORDER — HEPARIN SODIUM (PORCINE) 1000 UNIT/ML DIALYSIS
2500.0000 [IU] | Freq: Once | INTRAMUSCULAR | Status: AC
  Administered 2022-04-09: 2500 [IU] via INTRAVENOUS_CENTRAL

## 2022-04-09 MED ORDER — PENTAFLUOROPROP-TETRAFLUOROETH EX AERO: 1.0000 "application " | INHALATION_SPRAY | CUTANEOUS | Status: DC | PRN

## 2022-04-09 MED ORDER — ONDANSETRON HCL 4 MG/2ML IJ SOLN
INTRAMUSCULAR | Status: AC
  Administered 2022-04-10: 4 mg via INTRAVENOUS
  Filled 2022-04-09: qty 2

## 2022-04-09 MED ORDER — HEPARIN SODIUM (PORCINE) 1000 UNIT/ML DIALYSIS
1000.0000 [IU] | INTRAMUSCULAR | Status: DC | PRN
  Filled 2022-04-09 (×3): qty 1

## 2022-04-09 MED ORDER — LIDOCAINE HCL (PF) 1 % IJ SOLN
5.0000 mL | INTRAMUSCULAR | Status: DC | PRN
  Filled 2022-04-09: qty 5

## 2022-04-09 MED ORDER — ONDANSETRON HCL 4 MG/2ML IJ SOLN
4.0000 mg | Freq: Four times a day (QID) | INTRAMUSCULAR | Status: DC | PRN
  Administered 2022-04-09: 4 mg via INTRAVENOUS
  Filled 2022-04-09: qty 2

## 2022-04-09 MED ORDER — ALTEPLASE 2 MG IJ SOLR
2.0000 mg | Freq: Once | INTRAMUSCULAR | Status: AC | PRN
  Administered 2022-04-09: 2 mg
  Filled 2022-04-09 (×2): qty 2

## 2022-04-09 MED ORDER — HEPARIN SODIUM (PORCINE) 1000 UNIT/ML IJ SOLN
2500.0000 [IU] | Freq: Once | INTRAMUSCULAR | Status: AC
  Administered 2022-04-09: 2500 [IU] via INTRAVENOUS

## 2022-04-09 MED ORDER — LIDOCAINE-PRILOCAINE 2.5-2.5 % EX CREA
1.0000 "application " | TOPICAL_CREAM | CUTANEOUS | Status: DC | PRN
  Filled 2022-04-09: qty 5

## 2022-04-09 NOTE — Plan of Care (Signed)

## 2022-04-09 NOTE — Plan of Care (Signed)
?  Problem: Education: ?Goal: Knowledge of General Education information will improve ?Description: Including pain rating scale, medication(s)/side effects and non-pharmacologic comfort measures ?Outcome: Progressing ?  ?Problem: Activity: ?Goal: Risk for activity intolerance will decrease ?Outcome: Progressing ?  ?Problem: Pain Managment: ?Goal: General experience of comfort will improve ?Outcome: Progressing ?  ?Problem: Safety: ?Goal: Ability to remain free from injury will improve ?Outcome: Progressing ?  ?Problem: Skin Integrity: ?Goal: Risk for impaired skin integrity will decrease ?Outcome: Progressing ?  ?Problem: Education: ?Goal: Knowledge of General Education information will improve ?Description: Including pain rating scale, medication(s)/side effects and non-pharmacologic comfort measures ?Outcome: Progressing ?  ?

## 2022-04-09 NOTE — Progress Notes (Signed)
removed 3049ms net fluid unable to remove more due to hypotension.  first 2 hours unevenetful.  system clotted mid tx,  lost 125 mls of blood new setup done,  pt received heparin 25000 before clotting incident and 2500 units after new start .  then hypotension and then catheter not working as well placed cathflo activase to both lumens post tx . appropriately labled.  changed dressing to right chest .  gave dilaudid 2 mg for pain in shoulder, benadryl for itching, zofran for nausea during treatment.,  iv iron was not sent to dialysis in time to be given.  pre bp 144/64 post bp 131/56 pre weight 105.3kg post weight 102.0kg.  bed scales ?

## 2022-04-09 NOTE — TOC Progression Note (Addendum)
Transition of Care (TOC) - Progression Note  ? ? ?Patient Details  ?Name: Nancy [name] ?MRN: [id number] ?Date of Birth: 1961-05-08 ? ?Transition of Care (TOC) CM/SW Contact  ?Joanne Chars, LCSW ?Phone Number: ?04/09/2022, 3:46 PM ? ?Clinical Narrative:    ?CSW spoke with pt and provided bed offers.  Pt chose [facility], said she was familiar with [facility] and would rather be closer to hospital.  CSW LM with pt husband and brother Gwenlyn Perking to discuss this plan as well. ? ?1540: CSW again attempted to call husband, no answer.  CSW was able to reach brother Gwenlyn Perking, who said this plan was OK with him, said pt husband is at work, would get message later.   ? ?CSW spoke with [facility] and she does not have bed today but will look into the [place] process for rehab and get back to CSW.   ? ?PASSR: [id number] ? ? ?Expected Discharge Plan: [place] ?Barriers to Discharge: Ship broker, Continued Medical Work up, SNF Pending bed offer ? ?Expected Discharge Plan and Services ?Expected Discharge Plan: [place] ?  ?  ?  ?Living arrangements for the past 2 months: [place] ?                ?  ?  ?  ?  ?  ?  ?  ?  ?  ?  ? ? ?Social Determinants of Health (SDOH) Interventions ?  ? ?Readmission Risk Interventions ? ?  02/16/2022  ? 11:55 AM 09/21/2021  ?  9:05 AM 03/07/2020  ?  2:36 PM  ?Readmission Risk Prevention Plan  ?Transportation Screening Complete Complete Complete  ?PCP or Specialist Appt within 3-5 Days   Complete  ?Social Work Consult for [place] Planning/Counseling   Complete  ?Palliative Care Screening   Not Applicable  ?Medication Review Press photographer) Complete Complete Complete  ?PCP or Specialist appointment within 3-5 days of discharge  Complete   ?[place] or Home Care Consult  Complete   ?SW Recovery Care/Counseling Consult  Complete   ?Palliative Care Screening  Complete   ?[place]  Not Applicable   ? ? ?

## 2022-04-09 NOTE — Progress Notes (Addendum)
?[facility] ?Progress Note  ? ?Subjective:   Patient seen and examined at bedside in dialysis.  Initially some nausea but now improved with Zofran.  Denies CP, SOB, abdominal pain and n/v/d.  ? ?Objective ?Vitals:  ? 04/09/22 0716 04/09/22 0730 04/09/22 0800 04/09/22 0830  ?BP: (!) 168/79 (!) 97/44 (!) 120/31 131/65  ?Pulse: 74 73 76 75  ?Resp: 16     ?Temp:      ?TempSrc:      ?SpO2:      ?Weight:      ?Height:      ? ?Physical Exam ?General:well appearing female in NAD ?Heart:RRR, no mrg ?Lungs:CTAB, nml WOB ?Abdomen:soft, NTND ?Extremities:b/l BKA, 1-2+ edema from hips down ?Dialysis Access: [facility] in use, RU AVF +b/t ? ?Filed Weights  ? 04/06/22 1058 04/07/22 0349 04/09/22 0706  ?Weight: 102.2 kg 110.2 kg 105.3 kg  ? ?No intake or output data in the 24 hours ending 04/09/22 0859 ? ?Additional Objective ?Labs: ?Basic Metabolic Panel: ?Recent Labs  ?Lab 04/03/22 ?1043 04/04/22 ?0136 04/05/22 ?0136 04/06/22 ?0145 04/09/22 ?0240  ?NA 134*   < > 132* 133* 129*  ?K 4.0   < > 6.0* 4.4 4.1  ?CL 96*   < > 99 97* 93*  ?CO2 28   < > 21* 24 25  ?GLUCOSE 99   < > 282* 162* 140*  ?BUN 19   < > 24* 34* 36*  ?CREATININE 3.88*   < > 4.54* 5.62* 5.20*  ?CALCIUM 8.1*   < > 8.0* 8.2* 8.4*  ?PHOS 4.3  --   --   --  5.7*  ? < > = values in this interval not displayed.  ? ?Liver Function Tests: ?Recent Labs  ?Lab 04/03/22 ?1043 04/04/22 ?0136 04/09/22 ?0724  ?AST  --  16  --   ?ALT  --  14  --   ?ALKPHOS  --  58  --   ?BILITOT  --  0.6  --   ?PROT  --  6.3*  --   ?ALBUMIN 1.6* 1.8* 1.9*  ? ?CBC: ?Recent Labs  ?Lab 04/03/22 ?1042 04/04/22 ?9735 04/05/22 ?0136 04/06/22 ?0145 04/06/22 ?3299 04/07/22 ?0354 04/09/22 ?2426  ?WBC 17.0* 16.7* 13.9* 16.0*  --  15.0* 15.3*  ?NEUTROABS 12.9* 12.4*  --   --   --   --   --   ?HGB 8.5* 9.2* 10.2* 6.9* 6.7* 10.3* 9.8*  ?HCT 28.2* 29.9* 33.0* 22.4* 22.1* 31.8* 29.9*  ?MCV 90.7 89.5 89.7 91.4  --  88.8 89.8  ?PLT 323 326 322 297  --  286 304  ? ? ?Recent Labs  ?Lab 04/08/22 ?0629  04/08/22 ?0727 04/08/22 ?1131 04/08/22 ?1627 04/08/22 ?2156  ?GLUCAP 124* 103* 169* 180* 225*  ? ? ?Medications: ? sodium chloride    ? sodium chloride    ? ferric gluconate (FERRLECIT) IVPB    ? magnesium sulfate bolus IVPB    ? ? sodium chloride   Intravenous Once  ? sodium chloride   Intravenous Once  ? acetaminophen  1,000 mg Oral TID  ? amLODipine  5 mg Oral Daily  ? vitamin C  1,000 mg Oral Daily  ? atorvastatin  40 mg Oral Daily  ? calcium acetate  667 mg Oral TID WC  ? Chlorhexidine Gluconate Cloth  6 each Topical Q0600  ? docusate sodium  100 mg Oral Daily  ? ezetimibe  10 mg Oral Daily  ? heparin injection (subcutaneous)  5,000 Units Subcutaneous Q8H  ?  insulin aspart  0-5 Units Subcutaneous QHS  ? insulin aspart  0-9 Units Subcutaneous TID WC  ? insulin aspart  2 Units Subcutaneous TID WC  ? insulin glargine-yfgn  8 Units Subcutaneous QHS  ? multivitamin  1 tablet Oral QHS  ? nutrition supplement (JUVEN)  1 packet Oral BID BM  ? pantoprazole  40 mg Oral Daily  ? scopolamine  1 patch Transdermal Q72H  ? zinc sulfate  220 mg Oral Daily  ? ? ?Dialysis Orders: ?[facility] MWF ?  4h 32mn  400/1.5   95.5kg  2/2 bath  TDC (RUA AVF healing) Hep 2500 ? - 4/14 >  hep B Ag neg and hep B Abs were low / not protective  ? - last Hb 6.9 on 4/12 ? - mircera 200 q2, last 4/12, due 4/26 ? - venofer 100 x 10, 4 left ? - compliance= A, 2-4 kg ave uf ?   ? Home meds include - asa, lipitor, phoslo 1 ac, lantus and lispro insulin, norvasc 10, coreg 18.75 bid, risperdal 3 mg hs, prns/ vits/ supps ?  ?Assessment/ Plan: ?B foot/ heel ulcers w/ osteomyelitis + severe pvd: per vascular not candidate for revascularization.  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 DSharol Given Plan for SNF. S/p Vancomycin/Zosyn. ?ESRD - on HD MWF. Next HD today, continue regular schedule.  ?Vol/ HTN - BP improving with HD.  Home meds on hold.  On metoprolol prn. Lower extremity edema on exam. If weights correct not close to dry.  Will try to maximize UF with next HD.  Will need  lower dry weight on d/c post surgery.  ?Anemia ckd - Hgb initially drop 6.2 on admit, improved to 9-10 s/p 2 units pRBC.  Dropped again 4/21 to 6.7, now improved to 9.8 s/p 2 units pRBC with HD 4/21. Was getting IV fe and ESA at op unit. Next esa due 4/26. Will restart iron now that ABX completed.  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  Resume binder.  ?DM2 - on insulin, per pmd ?Nutrition - renal diet w/fluid restrictions. Alb 1.8, +protein supplements, renal vit ? ?LJen Mow PA-C ?[facility] ?04/09/2022,8:59 AM ? LOS: 11 days  ? ?Nephrology attending: ?I have personally seen and examined the patient at dialysis unit.  Chart reviewed.  I agree with above. ? ?Status post bilateral BKA, off of antibiotics.  Now awaiting safe discharge plan possibly to SNF.  The system clotted during dialysis therefore adding heparin 2500 units.  No blood flow issue with the catheter.  The patient is clinically stable.  We will continue MWF schedule for dialysis. ? ?D.  BCarolin Sicks MD ?[facility] ? ? ?

## 2022-04-09 NOTE — Progress Notes (Signed)
?  Progress Note ? ? ?Patient: Nancy [name] UTM:[id number] DOB: 1961/09/29 DOA: 03/29/2022     11 ?DOS: the patient was seen and examined on 04/09/2022   ?  ? ? ? ?Brief hospital course: ?Mrs. Shiffman is a 61 y.o. F with ESRD on HD, DM, neuropathy, dCHF, HTN, HLD, PVD, hx CVA, CAD, and obesity BMI 36 who was sent from wound clinic due to swelling, drainage from her chronically infected heel wound.   ? ?See summary from 4/20 ? ? ? ? ?Assessment and Plan: ? ?CHF ?No symptoms ?- Volume by HD ? ?DM ?Glucose good ?- Continue semglee, SS corrections, mealtime insulin ? ?PVD ?- Continue statin ? ?HTN ?BP elevated ?- Continue metoprolol, amlodipine ? ? ? ? ? ? ? ? ? ?Subjective: Patient seen on HD.  She has no complaints.  She is hard of hearing.  No nursing concerns. ? ? ? ? ?Physical Exam: ?Vitals:  ? 04/09/22 1030 04/09/22 1100 04/09/22 1139 04/09/22 1300  ?BP: (!) 141/63 127/62 (!) 126/58 (!) 115/58  ?Pulse: 77 80 73 78  ?Resp:   16 18  ?Temp:   (!) 97.5 ?F (36.4 ?C) 99 ?F (37.2 ?C)  ?TempSrc:    Oral  ?SpO2:    100%  ?Weight:   103.1 kg   ?Height:      ? ?Adult female, bilateral amputee, sleeping on the dialysis chair, arouses, makes eye contact but is hard of hearing, RRR, no murmurs, no pitting edema in the arms, lung sounds clear without rales or wheezes. ? ?Data Reviewed: ?Nephrology notes reviewed, nursing notes reviewed, vital signs reviewed. ?Glucose normal ?Sodium 129 ?White blood cell count 15, no change from previous, hemoglobin 9.8, no change from previous ? ? ?Family Communication:   ? ? ? ?Disposition: ?Status is: Inpatient ?The patient has had bilateral lower extremity amputation.  She will need placement for rehabilitation and acclimating to her new functional limitations ? ?She is medically cleared for discharge when a bed is available ? ? ? ? ? ? ? ?Author: ?Edwin Dada, MD ?04/09/2022 2:09 PM ? ?For on call review [email or web address].  ? ? ?

## 2022-04-09 NOTE — Telephone Encounter (Signed)
Call placed to Mr. Trulock who was with Mrs. Varnadore while in the hospital who reports she will be discharged to SNF for Rehab for an unknown time. I explained the discharge process from paramedicine program while patient is in SNF and that one she returns home to reach out and we would re-establish with Dr.McLean and PCP to start community paramedicine visits back. Mr. Newsham reports he will communicate this to his wife and he understood to reach out once she returns home. Discharge call complete.  ?

## 2022-04-10 DIAGNOSIS — D62 Acute posthemorrhagic anemia: Secondary | ICD-10-CM | POA: Diagnosis not present

## 2022-04-10 DIAGNOSIS — R110 Nausea: Secondary | ICD-10-CM

## 2022-04-10 DIAGNOSIS — L03116 Cellulitis of left lower limb: Secondary | ICD-10-CM | POA: Diagnosis not present

## 2022-04-10 DIAGNOSIS — I2510 Atherosclerotic heart disease of native coronary artery without angina pectoris: Secondary | ICD-10-CM | POA: Diagnosis not present

## 2022-04-10 DIAGNOSIS — E11621 Type 2 diabetes mellitus with foot ulcer: Secondary | ICD-10-CM | POA: Diagnosis not present

## 2022-04-10 LAB — GLUCOSE, CAPILLARY
Glucose-Capillary: 97 mg/dL (ref 70–99)
Glucose-Capillary: 128 mg/dL — ABNORMAL HIGH (ref 70–99)
Glucose-Capillary: 176 mg/dL — ABNORMAL HIGH (ref 70–99)

## 2022-04-10 MED ORDER — INSULIN GLARGINE 100 UNIT/ML SOLOSTAR PEN: 10.0000 [IU] | PEN_INJECTOR | Freq: Every day | SUBCUTANEOUS | 0 refills | Status: DC

## 2022-04-10 MED ORDER — ASPIRIN EC 81 MG PO TBEC
81.0000 mg | DELAYED_RELEASE_TABLET | Freq: Every day | ORAL | Status: DC
  Administered 2022-04-10: 81 mg via ORAL
  Filled 2022-04-10: qty 1

## 2022-04-10 MED ORDER — HYDROMORPHONE HCL 2 MG PO TABS: 2.0000 mg | ORAL_TABLET | ORAL | 0 refills | Status: DC | PRN

## 2022-04-10 MED ORDER — ACETAMINOPHEN 500 MG PO TABS
1000.0000 mg | ORAL_TABLET | Freq: Three times a day (TID) | ORAL | 0 refills | Status: AC
Start: 2022-04-10 — End: 2022-04-17

## 2022-04-10 MED ORDER — PANTOPRAZOLE SODIUM 40 MG PO TBEC: 40.0000 mg | DELAYED_RELEASE_TABLET | Freq: Every day | ORAL | Status: DC

## 2022-04-10 MED ORDER — ASCORBIC ACID 1000 MG PO TABS: 1000.0000 mg | ORAL_TABLET | Freq: Every day | ORAL | Status: DC

## 2022-04-10 MED ORDER — ZINC SULFATE 220 (50 ZN) MG PO CAPS: 220.0000 mg | ORAL_CAPSULE | Freq: Every day | ORAL | Status: DC

## 2022-04-10 MED ORDER — INSULIN LISPRO (1 UNIT DIAL) 100 UNIT/ML (KWIKPEN): PEN_INJECTOR | SUBCUTANEOUS | 3 refills | Status: DC

## 2022-04-10 MED ORDER — AMLODIPINE BESYLATE 5 MG PO TABS: 5.0000 mg | ORAL_TABLET | Freq: Every day | ORAL | Status: DC

## 2022-04-10 MED ORDER — DARBEPOETIN ALFA 150 MCG/0.3ML IJ SOSY: 150.0000 ug | PREFILLED_SYRINGE | INTRAMUSCULAR | Status: DC

## 2022-04-10 MED ORDER — RENA-VITE PO TABS: 1.0000 | ORAL_TABLET | Freq: Every day | ORAL | 0 refills | Status: DC

## 2022-04-10 MED ORDER — EZETIMIBE 10 MG PO TABS: 10.0000 mg | ORAL_TABLET | Freq: Every day | ORAL | Status: DC

## 2022-04-10 MED ORDER — CHLORHEXIDINE GLUCONATE CLOTH 2 % EX PADS
6.0000 | MEDICATED_PAD | Freq: Every day | CUTANEOUS | Status: DC
  Administered 2022-04-10: 6 via TOPICAL

## 2022-04-10 NOTE — TOC Transition Note (Signed)
Transition of Care (TOC) - CM/SW Discharge Note ? ? ?Patient Details  ?Name: Nancy [name] ?MRN: [id number] ?Date of Birth: 1961/01/10 ? ?Transition of Care [facility]) CM/SW Contact:  ?Joanne Chars, LCSW ?Phone Number: ?04/10/2022, 2:26 PM ? ? ?Clinical Narrative:   Pt discharging to [facility], room 164.  RN call report to [phone].  ? ? ? ?Final next level of care: [place] ?Barriers to Discharge: Barriers Resolved ? ? ?Patient Goals and CMS Choice ?Patient states their goals for this hospitalization and ongoing recovery are:: To be able to go home ?CMS Medicare.gov Compare Post Acute Care list provided to:: Patient ?Choice offered to / list presented to : Patient ? ?Discharge Placement ?  ?           ?Patient chooses bed at:  [facility]) ?Patient to be transferred to facility by: [facility] ?Name of family member notified: husband [name] in room ?Patient and family notified of of transfer: 04/10/22 ? ?Discharge Plan and Services ?  ?  ?           ?  ?  ?  ?  ?  ?  ?  ?  ?  ?  ? ?Social Determinants of Health (SDOH) Interventions ?  ? ? ?Readmission Risk Interventions ? ?  02/16/2022  ? 11:55 AM 09/21/2021  ?  9:05 AM 03/07/2020  ?  2:36 PM  ?Readmission Risk Prevention Plan  ?Transportation Screening Complete Complete Complete  ?PCP or Specialist Appt within 3-5 Days   Complete  ?Social Work Consult for [place] Planning/Counseling   Complete  ?Palliative Care Screening   Not Applicable  ?Medication Review Press photographer) Complete Complete Complete  ?PCP or Specialist appointment within 3-5 days of discharge  Complete   ?[place] or Home Care Consult  Complete   ?SW Recovery Care/Counseling Consult  Complete   ?Palliative Care Screening  Complete   ?[place]  Not Applicable   ? ? ? ? ? ?

## 2022-04-10 NOTE — Progress Notes (Signed)
Occupational Therapy Treatment ?Patient Details ?Name: Nancy [name] ?MRN: [id number] ?DOB: [birth year] ?Today's Date: 04/10/2022 ? ? ?History of present illness Pt is a 61 y/o female admitted 03/29/22 for surgical mgmt of necrotic abscesses of bilateral feet. 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 PMH: DM2, CVA, ESRD on HD, PVD, HTN, CHF, neuropathy, anemia, depression, and obesity. ?  ?OT comments ? EKG at bedside and husband eager to assist pt back to bed prior to leaving for work today. Pt's spouse reports he assists pt with scoot transfers at home though receptive to implementing techniques for safety/optimal body mechanics for Total A x 2 scoot back to bed today. As husband eager to participate in pt's care, would benefit from AP transfer education in next session to maximize safety and decrease fall risk during transfers. Noted CIR denial d/t no 24/7 support at DC, updated DC recs to SNF rehab.   ? ?Recommendations for follow up therapy are one component of a multi-disciplinary discharge planning process, led by the attending physician.  Recommendations may be updated based on patient status, additional functional criteria and insurance authorization. ?   ?Follow Up Recommendations ? Skilled nursing-short term rehab (<3 hours/day)  ?  ?Assistance Recommended at Discharge Frequent or constant Supervision/Assistance  ?Patient can return home with the following ? Two people to help with walking and/or transfers;A lot of help with bathing/dressing/bathroom ?  ?Equipment Recommendations ? Hospital bed  ?  ?Recommendations for Other Services   ? ?  ?Precautions / Restrictions Precautions ?Precautions: Fall ?Precaution Comments: Bil BKA with wound vacs/limb protectors ?Restrictions ?Weight Bearing Restrictions: Yes ?RLE Weight Bearing: Non weight bearing ?LLE Weight Bearing: Non weight bearing  ? ? ?  ? ?Mobility Bed Mobility ?Overal bed mobility: Needs Assistance ?Bed Mobility: Sit to Supine ?  ?  ?   ?Sit to supine: Min assist ?  ?General bed mobility comments: to initiate lying back down and slow descent ?  ? ?Transfers ?Overall transfer level: Needs assistance ?Equipment used: None ?Transfers: Bed to chair/wheelchair/BSC ?  ?  ?  ?  ?  ? Lateral/Scoot Transfers: Total assist, +2 physical assistance, +2 safety/equipment ?General transfer comment: Total A x 2 for lateral scoot back to bed (husband reports this is how they do it at home). Husband initially attempted to lift pt directly upwards but encouraged him to use bed pad to assist in pulling pt over while sitting upright. OT supported in front/with bed pad for scoot back to bed. Anticipate pt able to assist more though husband eager to assist ?  ?  ?Balance Overall balance assessment: Needs assistance ?Sitting-balance support: Bilateral upper extremity supported ?Sitting balance-Leahy Scale: Good ?  ?  ?  ?  ?  ?  ?  ?  ?  ?  ?  ?  ?  ?  ?  ?  ?   ? ?ADL either performed or assessed with clinical judgement  ? ?ADL Overall ADL's : Needs assistance/impaired ?  ?  ?  ?  ?  ?  ?  ?  ?  ?  ?Lower Body Dressing: Maximal assistance;Sitting/lateral leans;Bed level ?Lower Body Dressing Details (indicate cue type and reason): for limb protector mgmt ?  ?  ?  ?  ?  ?  ?  ?General ADL Comments: Focus on functional transfer training with husband present and hands on to assist ?  ? ?Extremity/Trunk Assessment Upper Extremity Assessment ?Upper Extremity Assessment: Overall WFL for tasks assessed ?  ?  Lower Extremity Assessment ?Lower Extremity Assessment: Defer to PT evaluation ?  ?  ?  ? ?Vision   ?Vision Assessment?: No apparent visual deficits ?  ?Perception   ?  ?Praxis   ?  ? ?Cognition Arousal/Alertness: Awake/alert ?Behavior During Therapy: Flat affect ?Overall Cognitive Status: Impaired/Different from baseline ?Area of Impairment: Problem solving ?  ?  ?  ?  ?  ?  ?  ?  ?  ?  ?  ?  ?  ?  ?Problem Solving: Slow processing, Decreased initiation, Requires verbal  cues ?General Comments: decreased awareness of deficits, slower processing and requires repetition of cues ?  ?  ?   ?Exercises   ? ?  ?Shoulder Instructions   ? ? ?  ?General Comments Husband present. EKG tech in for test  ? ? ?Pertinent Vitals/ Pain       Pain Assessment ?Pain Assessment: Faces ?Faces Pain Scale: No hurt ?Pain Intervention(s): Patient requesting pain meds-RN notified ? ?Home Living   ?  ?  ?  ?  ?  ?  ?  ?  ?  ?  ?  ?  ?  ?  ?  ?  ?  ?  ? ?  ?Prior Functioning/Environment    ?  ?  ?  ?   ? ?Frequency ? Min 2X/week  ? ? ? ? ?  ?Progress Toward Goals ? ?OT Goals(current goals can now be found in the care plan section) ? Progress towards OT goals: Progressing toward goals ? ?Acute Rehab OT Goals ?Patient Stated Goal: none stated, husband eager to assist and be hands on with pt ?OT Goal Formulation: With patient ?Time For Goal Achievement: 04/19/22 ?Potential to Achieve Goals: Good ?ADL Goals ?Pt Will Perform Lower Body Bathing: with min assist;sitting/lateral leans ?Pt Will Perform Lower Body Dressing: with min assist;sitting/lateral leans ?Pt Will Transfer to Toilet: with min assist;bedside commode;anterior/posterior transfer;with transfer board ?Pt/caregiver will Perform Home Exercise Program: Increased strength;Both right and left upper extremity;With theraband;Independently;With written HEP provided ?Additional ADL Goal #1: Pt to demo ability to maintain dynamic sitting balance > 5 min without LOB during functional tasks  ?Plan Discharge plan needs to be updated   ? ?Co-evaluation ? ? ?   ?  ?  ?  ?  ? ?  ?AM-PAC OT "6 Clicks" Daily Activity     ?Outcome Measure ? ? Help from another person eating meals?: A Little ?Help from another person taking care of personal grooming?: A Little ?Help from another person toileting, which includes using toliet, bedpan, or urinal?: Total ?Help from another person bathing (including washing, rinsing, drying)?: A Lot ?Help from another person to put on and taking  off regular upper body clothing?: A Little ?Help from another person to put on and taking off regular lower body clothing?: A Lot ?6 Click Score: 14 ? ?  ?End of Session Equipment Utilized During Treatment: Gait belt ? ?OT Visit Diagnosis: Other abnormalities of gait and mobility (R26.89) ?  ?Activity Tolerance Patient tolerated treatment well ?  ?Patient Left in bed;with call bell/phone within reach;with bed alarm set;with family/visitor present;Other (comment) (EKG at bedside) ?  ?Nurse Communication Mobility status;Patient requests pain meds ?  ? ?   ? ?Time: 1610-9604 ?OT Time Calculation (min): 11 min ? ?Charges: OT General Charges ?$OT Visit: 1 Visit ?OT Treatments ?$Therapeutic Activity: 8-22 mins ? ?Malachy Chamber, OTR/L ?Acute Rehab Services ?Office: [phone]  ? ?Layla Maw ?04/10/2022, 2:23 PM ? ? ?

## 2022-04-10 NOTE — Plan of Care (Signed)
  Problem: Education: Goal: Knowledge of General Education information will improve Description: Including pain rating scale, medication(s)/side effects and non-pharmacologic comfort measures Outcome: Adequate for Discharge   Problem: Health Behavior/Discharge Planning: Goal: Ability to manage health-related needs will improve Outcome: Adequate for Discharge   Problem: Clinical Measurements: Goal: Ability to maintain clinical measurements within normal limits will improve Outcome: Adequate for Discharge Goal: Will remain free from infection Outcome: Adequate for Discharge Goal: Diagnostic test results will improve Outcome: Adequate for Discharge Goal: Respiratory complications will improve Outcome: Adequate for Discharge Goal: Cardiovascular complication will be avoided Outcome: Adequate for Discharge   Problem: Activity: Goal: Risk for activity intolerance will decrease Outcome: Adequate for Discharge   Problem: Nutrition: Goal: Adequate nutrition will be maintained Outcome: Adequate for Discharge   Problem: Coping: Goal: Level of anxiety will decrease Outcome: Adequate for Discharge   Problem: Elimination: Goal: Will not experience complications related to bowel motility Outcome: Adequate for Discharge Goal: Will not experience complications related to urinary retention Outcome: Adequate for Discharge   Problem: Pain Managment: Goal: General experience of comfort will improve Outcome: Adequate for Discharge   Problem: Safety: Goal: Ability to remain free from injury will improve Outcome: Adequate for Discharge   Problem: Skin Integrity: Goal: Risk for impaired skin integrity will decrease Outcome: Adequate for Discharge   Problem: Education: Goal: Knowledge of General Education information will improve Description: Including pain rating scale, medication(s)/side effects and non-pharmacologic comfort measures Outcome: Adequate for Discharge   Problem: Health  Behavior/Discharge Planning: Goal: Ability to manage health-related needs will improve Outcome: Adequate for Discharge   Problem: Clinical Measurements: Goal: Ability to maintain clinical measurements within normal limits will improve Outcome: Adequate for Discharge Goal: Will remain free from infection Outcome: Adequate for Discharge Goal: Diagnostic test results will improve Outcome: Adequate for Discharge Goal: Respiratory complications will improve Outcome: Adequate for Discharge Goal: Cardiovascular complication will be avoided Outcome: Adequate for Discharge   Problem: Activity: Goal: Risk for activity intolerance will decrease Outcome: Adequate for Discharge   Problem: Nutrition: Goal: Adequate nutrition will be maintained Outcome: Adequate for Discharge   Problem: Coping: Goal: Level of anxiety will decrease Outcome: Adequate for Discharge   Problem: Elimination: Goal: Will not experience complications related to bowel motility Outcome: Adequate for Discharge Goal: Will not experience complications related to urinary retention Outcome: Adequate for Discharge   Problem: Pain Managment: Goal: General experience of comfort will improve Outcome: Adequate for Discharge   Problem: Safety: Goal: Ability to remain free from injury will improve Outcome: Adequate for Discharge   Problem: Skin Integrity: Goal: Risk for impaired skin integrity will decrease Outcome: Adequate for Discharge   

## 2022-04-10 NOTE — Progress Notes (Signed)
?Progress Note ? ? ?Patient: Nancy [name] FMB:[id number] DOB: 17-May-1961 DOA: 03/29/2022     12 ?DOS: the patient was seen and examined on 04/10/2022 at 10:02AM ?  ? ? ? ?Brief hospital course: ?Nancy [name] is a 61 y.o. F with ESRD on HD, DM, neuropathy, dCHF, HTN, HLD, PVD, hx CVA, CAD, and obesity BMI 36 who was sent from wound clinic due to swelling, drainage from her chronically infected heel wound.   ? ? ?4/13: Admitted on antibiotics, found to have exposed calcaneous with extensive surrounding tissue loss ?4/14: Vascular surgery consulted, [name] above, no role for limb salvage ?4/15: Orthopedics consulted, recommended bilateral BKAs ?4/17: Family and patient decided to proceed with amputation ?4/19: To OR for bilateral amputation by Nancy [name] ?7/01-7/79: Uncomplicated post-op course, pending SNF ? ? ? ? 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 ?Completed 24 hours post-op Abx ?- PT eval ?- TOC consult ? ? ? ?  ? ?ESRD on dialysis [facility]) ?New start HD last month ?- Consult Nephrology for routine HD ?- Continue Phoslo ? ?Hypokalemia ?QT prolongation ?K normalized ? ?Chronic diastolic CHF (congestive heart failure) ([place]) ?Echo done October 2022 showing LVEF 50 to 55% and grade 2 diastolic dysfunction.  No significant volume overload. ?- Volume management per dialysis ? ?Insulin dependent type 2 diabetes mellitus ([place]) ?Presented with a glucose level greater than 400.  HbA1c 6.3 in March. ? ?Glucose normal now ?- Continue Glargine ?- Continue SS corrections ?- Continue mealtime insulin ?- Continue atorvastatin, Zetia, aspirin ? ?CAD (coronary artery disease) ?- Continue aspirin, atorvastatin, Zetia, metoprolol ? ?Acute postoperative anemia due to expected blood loss superimposed on anemia of chronic renal insufficiency ?Hgb had been around 8-10  g/dL, was 7.5 g/dL on admission, trasnfused 2 units PRBCs on 4/14, 2 more units post-op on 4/21 ?- Continue aranesp, IV iron per Nephrology ?- Ttransfusion threshold 7 g/dL  ? ?Nausea ?Unclear cause.  Resolved a while ago.  Typical anti-emetics held due QTc.   ?- Stop scopolamine [name] resolution ? ?Prolonged QT interval ?On admission.   ?- Repeat EKG to verify   ? ?Hyponatremia ?Na stable ?- Volume status by HD ? ?Hyperkalemia ?Resolved ? ?Subacute osteomyelitis, right ankle and foot ([place]) ?See above ? ?Peripheral artery disease ([place]) ?- Continue Zetia, aspirin, atorvastatin ? ?Cellulitis of left foot ?See above ? ?HTN (hypertension) ?BP controlled ?- Continue metoprolol, amlodipine ? ?Morbid obesity ([place]) ?BMI 36 with hypertension, diabetes ? ? ? ? ?   ? ? ? ? ? ? ?Subjective: Patient has no complaints, no headache, chest pain, dyspnea, abdominal pain, vomiting, diarrhea. ? ? ? ? ?Physical Exam: ?Vitals:  ? 04/09/22 1500 04/09/22 1958 04/10/22 0500 04/10/22 0813  ?BP: 130/69 118/61  138/76  ?Pulse: 85 85  83  ?Resp: '17 18  17  '$ ?Temp: 99.1 ?F (37.3 ?C) 98.4 ?F (36.9 ?C)  98.6 ?F (37 ?C)  ?TempSrc: Oral Oral  Oral  ?SpO2: 100% 99%  99%  ?Weight:   110.3 kg   ?Height:      ? ?Adult female, sleeping, arouses easily when I enter, interactive, has questions about her postop follow-up, and about her stumps and neck steps after discharge. ?RRR, no murmurs, no peripheral edema ?Respiratory effort normal, lungs clear without rales or wheezes ?Abdomen soft without tenderness palpation or guarding ?Bilateral amputation and postop apparatus ?Attention  normal, affect blunted, judgment and insight appear normal, moves upper extremities with normal strength and coordination, speech dysarthric at baseline, very hard of hearing. ? ? ? ? ? ?Data Reviewed: ?Nursing notes reviewed, nephrology notes reviewed, vital signs reviewed ?Glucose normal ? ? ? ? ? ? ? ? ?Disposition: ?Status is: Inpatient ?She was admitted for osteomyelitis of  the heel and gangrene of the other foot.  She was evaluated by vascular surgery who could provide no limb salvage options. ? ?Orthopedics was consulted and performed bilateral amputations.  The patient will require extensive rehabilitation to regain independence [name] her new functional limitations. ? ?She is medically ready for discharge to SNF when bed is available ? ? ? ? ? ? ? ? ? ? ?Author: ?Edwin Dada, MD ?04/10/2022 1:58 PM ? ?For on call review [email or web address].  ? ? ?

## 2022-04-10 NOTE — TOC Progression Note (Addendum)
Transition of Care (TOC) - Progression Note  ? ? ?Patient Details  ?Name: Nancy [name] ?MRN: [id number] ?Date of Birth: 14-Nov-1961 ? ?Transition of Care (TOC) CM/SW Contact  ?Joanne Chars, LCSW ?Phone Number: ?04/10/2022, 1:02 PM ? ?Clinical Narrative:   HD confirmed through [facility], MWF 1125 arrival for 1145 chair time.   ? ?[facility] working on auth. ? ?1345: CSW informed pt friend is asking CSW to look at different SNF facility, [facility] in [place].   ?CSW informed by [place] that she has [place], ready for pt at [facility]. ? ?1410: CSW spoke with pt husband [name] outside of pt room.  Discussed that pt is ready for transfer to [facility].  Husband does not want to pursue placement in [place] and wants to stick with plan for pt to DC to [facility].  ? ? ? ?Expected Discharge Plan: [place] ?Barriers to Discharge: Ship broker, Continued Medical Work up, SNF Pending bed offer ? ?Expected Discharge Plan and Services ?Expected Discharge Plan: [place] ?  ?  ?  ?Living arrangements for the past 2 months: [place] ?                ?  ?  ?  ?  ?  ?  ?  ?  ?  ?  ? ? ?Social Determinants of Health (SDOH) Interventions ?  ? ?Readmission Risk Interventions ? ?  02/16/2022  ? 11:55 AM 09/21/2021  ?  9:05 AM 03/07/2020  ?  2:36 PM  ?Readmission Risk Prevention Plan  ?Transportation Screening Complete Complete Complete  ?PCP or Specialist Appt within 3-5 Days   Complete  ?Social Work Consult for [place] Planning/Counseling   Complete  ?Palliative Care Screening   Not Applicable  ?Medication Review Press photographer) Complete Complete Complete  ?PCP or Specialist appointment within 3-5 days of discharge  Complete   ?[place] or Home Care Consult  Complete   ?SW Recovery Care/Counseling Consult  Complete   ?Palliative Care Screening  Complete   ?[place]  Not Applicable   ? ? ?

## 2022-04-10 NOTE — Discharge Summary (Signed)
?Physician Discharge Summary ?  ?Patient: Nancy [name] MRN: [id number] DOB: 05-Feb-1961  ?Admit date:     03/29/2022  ?Discharge date: 04/10/22  ?Discharge Physician: Edwin Dada  ? ?PCP: [name], MD  ? ? ? ?Recommendations at discharge:  ?Follow up with Orthopedics Dr. Sharol Given for post-op care of bilateral amputations as directed ?Check Hgb in 1 week ? ? ? ? ?Discharge Diagnoses: ?Principal Problem: ?  Gangrenous ulcers of both heels with cellulitis and osteomyelitis of left calcaneus ?Active Problems: ?  Subacute osteomyelitis, right ankle and foot and Cellulitis of left foot ?  Diabetic food infection ?  Hypokalemia ?  ESRD on dialysis [facility]) ?  Insulin dependent type 2 diabetes mellitus ([place]) ?  Chronic diastolic CHF (congestive heart failure) ([place]) ?  Acute postoperative anemia due to expected blood loss superimposed on anemia of chronic renal insufficiency ?  CAD (coronary artery disease) ?  Morbid obesity ([place]) ?  Essential hypertension ?  Peripheral artery disease ([place]) ?  Hyperkalemia ?  Hyponatremia ?  Prolonged QT interval ?  Nausea ? ? ? ? ? ?Hospital Course: ?Nancy [name] is a 61 y.o. F with ESRD on HD, DM, neuropathy, dCHF, HTN, HLD, PVD, hx CVA, CAD, and obesity BMI 36 who was sent from wound clinic due to swelling, drainage from her chronically infected heel wound.   ? ? ? ? ? ? ? ? 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 Sharol Given ?4/13: Admitted on antibiotics, found to have exposed calcaneous with extensive surrounding tissue loss ?4/14: Vascular surgery consulted, given above, no role for limb salvage ?4/15: Orthopedics consulted, recommended bilateral BKAs ?4/17: Family and patient decided to proceed with amputation ?4/19: To OR for bilateral amputation by Dr. Sharol Given ?4/31-5/40: Uncomplicated post-op course, pending SNF ? ? ? ? ? ?ESRD on  dialysis [facility]) ?New start HD last month ?  ?  ? ?Chronic diastolic CHF (congestive heart failure) ([place]) ?Echo done October 2022 showing LVEF 50 to 55% and grade 2 diastolic dysfunction.  No significant volume overload here post-op. ?  ? ?Insulin dependent type 2 diabetes mellitus ([place]) ?Presented with a glucose level greater than 400.  HbA1c 6.3 in March. ? ?CAD (coronary artery disease) ?Continue aspirin, atorvastatin, Zetia, metoprolol ? ?Acute postoperative anemia due to expected blood loss superimposed on anemia of chronic renal insufficiency ?Hgb had been around 8-10 g/dL, was 7.5 g/dL on admission, trasnfused 2 units PRBCs on 4/14, 2 more units post-op on 4/21 ? ?Nausea ?Unclear cause.  Resolved a while ago.  Typical anti-emetics held due QTc.   ?- Stop scopolamine given resolution ?  ? ? ? ? ?  ? ? ? ? ? ?[facility] Controlled Substances Registry was reviewed for this patient prior to discharge.  ? ?Consultants: Vascular Surgery, Orthopedics ?Procedures performed: Bilateral  BKA with wound vac application ?Disposition: Skilled nursing facility ?Diet recommendation: Renal, diabetic ? ? ?DISCHARGE MEDICATION: ?Allergies as of 04/10/2022   ? ?   Reactions  ? Hydralazine Hcl Other (See Comments)  ? Hair loss  ? Hydrocodone Itching, Other (See Comments)  ? Upset stomach  ? Metformin And Related Nausea And Vomiting, Other (See Comments)  ? Stomach pains, also  ? Other Nausea Only, Other (See Comments)  ? Lettuce- Does not digest this!!  ? Plaquenil [hydroxychloroquine Sulfate] Hives  ? Shellfish-derived Products Nausea Only, Other (See  Comments)  ? Caused an upset stomach  ? Shrimp (diagnostic) Nausea Only, Other (See Comments)  ? Upset stomach   ? Sulfa Antibiotics Hives  ? ?  ? ?  ?Medication List  ?  ? ?STOP taking these medications   ? ?carvedilol 12.5 MG tablet ?Commonly known as: COREG ?  ?risperiDONE 3 MG tablet ?Commonly known as: RISPERDAL ?  ? ?  ? ?TAKE these medications   ? ?acetaminophen 500 MG  tablet ?Commonly known as: TYLENOL ?Take 2 tablets (1,000 mg total) by mouth 3 (three) times daily for 7 days. ?  ?amLODipine 5 MG tablet ?Commonly known as: NORVASC ?Take 1 tablet (5 mg total) by mouth daily. ?Start taking on: April 11, 2022 ?What changed:  ?medication strength ?how much to take ?  ?ascorbic acid 1000 MG tablet ?Commonly known as: VITAMIN C ?Take 1 tablet (1,000 mg total) by mouth daily. ?Start taking on: April 11, 2022 ?  ?Aspirin Low Dose 81 MG EC tablet ?Generic drug: aspirin ?Take 1 tablet (81 mg total) by mouth daily. ?  ?atorvastatin 40 MG tablet ?Commonly known as: LIPITOR ?Take 1 tablet (40 mg total) by mouth daily. ?  ?calcium acetate 667 MG capsule ?Commonly known as: PHOSLO ?TAKE 1 CAPSULE (667 MG TOTAL) BY MOUTH 3 (THREE) TIMES DAILY WITH MEALS. ?  ?Dexcom G6 Receiver Devi ?1 Device by Does not apply route daily. ?  ?Dexcom G6 Sensor Misc ?1 packet by Does not apply route daily. ?  ?Dexcom G6 Transmitter Misc ?1 packet by Does not apply route daily. ?  ?ezetimibe 10 MG tablet ?Commonly known as: ZETIA ?Take 1 tablet (10 mg total) by mouth daily. ?Start taking on: April 11, 2022 ?  ?ferrous sulfate 325 (65 FE) MG tablet ?Take 1 tablet (325 mg total) by mouth daily with breakfast. ?  ?glucose blood test strip ?Use as instructedCheck blood sugar three times daily. E11.40 ?  ?HYDROmorphone 2 MG tablet ?Commonly known as: DILAUDID ?Take 1-1.5 tablets (2-3 mg total) by mouth every 4 (four) hours as needed for severe pain (pain score 7-10). ?  ?insulin glargine 100 UNIT/ML Solostar Pen ?Commonly known as: LANTUS ?Inject 10 Units into the skin at bedtime. ?What changed: how much to take ?  ?insulin lispro 100 UNIT/ML KwikPen ?Commonly known as: HumaLOG KwikPen ?Inject 3 units before breakfast and dinner. ?What changed: additional instructions ?  ?Insulin Pen Needle 32G X 4 MM Misc ?Use to inject insulin as directed. ?  ?multivitamin Tabs tablet ?Take 1 tablet by mouth at bedtime. ?  ?OneTouch  Verio w/Device Kit ?Check blood sugar three times daily. E11.40 ?  ?pantoprazole 40 MG tablet ?Commonly known as: PROTONIX ?Take 1 tablet (40 mg total) by mouth daily. ?Start taking on: April 11, 2022 ?  ?zinc sulfate 220 (50 Zn) MG capsule ?Take 1 capsule (220 mg total) by mouth daily. ?Start taking on: April 11, 2022 ?  ? ?  ? ?  ?  ? ? ?  ?Discharge Care Instructions  ?(From admission, onward)  ?  ? ? ?  ? ?  Start     Ordered  ? 04/10/22 0000  Discharge wound care:       ?Comments: Keep wound vacs in place until follow up with Dr. Sharol Given  ? 04/10/22 1426  ? ?  ?  ? ?  ? ? Contact information for follow-up providers   ? ? Newt Minion, MD Follow up in 1 week(s).   ?Specialty: Orthopedic Surgery ?Contact  information: ?[address] ?[place] [address] ?[phone] ? ? ?  ?  ? ?  ?  ? ? Contact information for after-discharge care   ? ? Destination   ? ? [facility] Preferred SNF .   ?Service: Skilled Nursing ?Contact information: ?[name] ?[place] ?[phone] ? ?  ?  ? ?  ?  ? ?  ?  ? ?  ? ? ?Discharge Instructions   ? ? Discharge instructions   Complete by: As directed ?  ? From Dr. Loleta Books: ?You were admitted for gangrene of the heels. ?This is an infection that is partially caused by lack of blood flow to the feet. ?Our vascular surgeons were consulted who evaluated your legs and felt there was no viable options to save or fix blood flow to the feet. ?Our orthopedic surgeon, Dr. Sharol Given, evaluated your feet and recommended the amputations. ? ?Follow up with the surgeon who did the surgery, Dr. Sharol Given, in his office ?His contact information is listed below in the To Do section. ? ?Start the new cholesterol medicine Zetia ?Continue your other home medicines ?Manage your blood sugars carefully.  ? Discharge wound care:   Complete by: As directed ?  ? Keep wound vacs in place until follow up with Dr. Sharol Given  ? Increase activity slowly   Complete by: As  directed ?  ? ?  ? ? ?Discharge Exam: ?Filed Weights  ? 04/09/22 0706 04/09/22 1139 04/10/22 0500  ?Weight: 105.3 kg 103.1 kg 110.3 kg  ? ? ?General: Pt is alert, awake, not in acute distress ?Cardiovascular: RRR,

## 2022-04-10 NOTE — Progress Notes (Signed)
?[facility] ?Progress Note  ? ?Subjective:   Patient seen and examined at bedside.  Eating breakfast.  Pain is mostly well controlled.  No additional nausea after receiving zofran with HD yesterday.  No specific complaints.  Denies CP, SOB, abdominal pain and n/v/d.  ? ?Objective ?Vitals:  ? 04/09/22 1500 04/09/22 1958 04/10/22 0500 04/10/22 0813  ?BP: 130/69 118/61  138/76  ?Pulse: 85 85  83  ?Resp: '17 18  17  '$ ?Temp: 99.1 ?F (37.3 ?C) 98.4 ?F (36.9 ?C)  98.6 ?F (37 ?C)  ?TempSrc: Oral Oral  Oral  ?SpO2: 100% 99%  99%  ?Weight:   110.3 kg   ?Height:      ? ?Physical Exam ?General:chronically ill appearing female in NAD ?Heart:RRR, no mrg ?Lungs:CTAB, nml WOB  ?Abdomen:soft, NTND ?Extremities:1+edema in hips/thighs, b/l BKA ?Dialysis Access: LU AVF +b/t, TDC  ? ?Filed Weights  ? 04/09/22 0706 04/09/22 1139 04/10/22 0500  ?Weight: 105.3 kg 103.1 kg 110.3 kg  ? ? ?Intake/Output Summary (Last 24 hours) at 04/10/2022 0903 ?Last data filed at 04/09/2022 1700 ?Gross per 24 hour  ?Intake 240 ml  ?Output 2850 ml  ?Net -2610 ml  ? ? ?Additional Objective ?Labs: ?Basic Metabolic Panel: ?Recent Labs  ?Lab 04/03/22 ?1043 04/04/22 ?0136 04/05/22 ?0136 04/06/22 ?0145 04/09/22 ?3354  ?NA 134*   < > 132* 133* 129*  ?K 4.0   < > 6.0* 4.4 4.1  ?CL 96*   < > 99 97* 93*  ?CO2 28   < > 21* 24 25  ?GLUCOSE 99   < > 282* 162* 140*  ?BUN 19   < > 24* 34* 36*  ?CREATININE 3.88*   < > 4.54* 5.62* 5.20*  ?CALCIUM 8.1*   < > 8.0* 8.2* 8.4*  ?PHOS 4.3  --   --   --  5.7*  ? < > = values in this interval not displayed.  ? ?Liver Function Tests: ?Recent Labs  ?Lab 04/03/22 ?1043 04/04/22 ?0136 04/09/22 ?0724  ?AST  --  16  --   ?ALT  --  14  --   ?ALKPHOS  --  80  --   ?BILITOT  --  0.6  --   ?PROT  --  6.3*  --   ?ALBUMIN 1.6* 1.8* 1.9*  ? ?CBC: ?Recent Labs  ?Lab 04/03/22 ?1042 04/04/22 ?5625 04/05/22 ?0136 04/06/22 ?0145 04/06/22 ?6389 04/07/22 ?0354 04/09/22 ?3734  ?WBC 17.0* 16.7* 13.9* 16.0*  --  15.0* 15.3*  ?NEUTROABS 12.9*  12.4*  --   --   --   --   --   ?HGB 8.5* 9.2* 10.2* 6.9* 6.7* 10.3* 9.8*  ?HCT 28.2* 29.9* 33.0* 22.4* 22.1* 31.8* 29.9*  ?MCV 90.7 89.5 89.7 91.4  --  88.8 89.8  ?PLT 323 326 322 297  --  286 304  ? ?CBG: ?Recent Labs  ?Lab 04/08/22 ?2156 04/09/22 ?1220 04/09/22 ?1657 04/09/22 ?2134 04/10/22 ?0710  ?GLUCAP 225* 144* 161* 255* 97  ? ?Medications: ? ferric gluconate (FERRLECIT) IVPB 125 mg (04/09/22 1741)  ? magnesium sulfate bolus IVPB    ? ? sodium chloride   Intravenous Once  ? sodium chloride   Intravenous Once  ? acetaminophen  1,000 mg Oral TID  ? amLODipine  5 mg Oral Daily  ? vitamin C  1,000 mg Oral Daily  ? atorvastatin  40 mg Oral Daily  ? calcium acetate  667 mg Oral TID WC  ? Chlorhexidine Gluconate Cloth  6 each Topical Q0600  ?  docusate sodium  100 mg Oral Daily  ? ezetimibe  10 mg Oral Daily  ? heparin injection (subcutaneous)  5,000 Units Subcutaneous Q8H  ? insulin aspart  0-5 Units Subcutaneous QHS  ? insulin aspart  0-9 Units Subcutaneous TID WC  ? insulin aspart  2 Units Subcutaneous TID WC  ? insulin glargine-yfgn  8 Units Subcutaneous QHS  ? multivitamin  1 tablet Oral QHS  ? nutrition supplement (JUVEN)  1 packet Oral BID BM  ? pantoprazole  40 mg Oral Daily  ? scopolamine  1 patch Transdermal Q72H  ? zinc sulfate  220 mg Oral Daily  ? ? ?Dialysis Orders: ?[facility] MWF ?  4h 74mn  400/1.5   95.5kg  2/2 bath  TDC (RUA AVF healing) Hep 2500 ? - 4/14 >  hep B Ag neg and hep B Abs were low / not protective  ? - last Hb 6.9 on 4/12 ? - mircera 200 q2, last 4/12, due 4/26 ? - venofer 100 x 10, 4 left ? - compliance= A, 2-4 kg ave uf ?   ? Home meds include - asa, lipitor, phoslo 1 ac, lantus and lispro insulin, norvasc 10, coreg 18.75 bid, risperdal 3 mg hs, prns/ vits/ supps ?  ?Assessment/ Plan: ?B foot/ heel ulcers w/ osteomyelitis + severe pvd: per vascular not candidate for revascularization.  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 DSharol Given Plan for SNF. S/p Vancomycin/Zosyn. ?ESRD - on HD MWF. Next tomorrow per  regular schedule. System clotted yesterday, restart Heparin 5000 unit bolus per HD.  Required cath flo as well.   ?Vol/ HTN - BP in goal this AM. Home meds on hold.  On metoprolol prn. Lower extremity edema on exam. If weights correct not close to dry.  Attempt to maximize UF with HD.  Hypotension limited last HD. Will need lower dry weight on d/c post surgery.  ?Anemia ckd - Hgb initially drop 6.2 on admit, improved to 9-10 s/p 2 units pRBC.  Dropped again 4/21 to 6.7, now improved to 9.8 s/p 2 units pRBC with HD 4/21. Was getting IV fe and ESA at op unit. Next esa due 4/26 -ordered. Iron restarted.  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  Resume binder.  ?DM2 - on insulin, per pmd ?Nutrition - renal diet w/fluid restrictions. Alb 1.9, +protein supplements, renal vit. Encouraged nutrition.  ?Dispo - pending SNF placement ? ?LJen Mow PA-C ?[facility] ?04/10/2022,9:03 AM ? LOS: 12 days  ? ? ?

## 2022-04-10 NOTE — Progress Notes (Signed)
Physical Therapy Treatment ?Patient Details ?Name: Nancy [name] ?MRN: [id number] ?DOB: 31-Mar-1961 ?Today's Date: 04/10/2022 ? ? ?History of Present Illness Pt is a 61 y/o female admitted 03/29/22 for surgical mgmt of necrotic abscesses of bilateral feet. 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 PMH: DM2, CVA, ESRD on HD, PVD, HTN, CHF, neuropathy, anemia, depression, and obesity. ? ?  ?PT Comments  ? ? Patient HOH and slow to respond. She is pleasant and agreeable to PT session. Patient requires mod +1-2 assist for scooting in bed to get lined up with recliner and then mod +2 to scoot back into recliner. Patient will continue to benefit from skilled PT while here to improve functional independence and strength.  ?     ?Recommendations for follow up therapy are one component of a multi-disciplinary discharge planning process, led by the attending physician.  Recommendations may be updated based on patient status, additional functional criteria and insurance authorization. ? ?Follow Up Recommendations ? Skilled nursing-short term rehab (<3 hours/day) ?  ?  ?Assistance Recommended at Discharge Frequent or constant Supervision/Assistance  ?Patient can return home with the following A lot of help with bathing/dressing/bathroom;Assistance with cooking/housework;Direct supervision/assist for medications management;Direct supervision/assist for financial management;Assist for transportation;A lot of help with walking and/or transfers ?  ?Equipment Recommendations ? Hospital bed;Wheelchair (measurements PT);Wheelchair cushion (measurements PT)  ?  ?Recommendations for Other Services   ? ? ?  ?Precautions / Restrictions Precautions ?Precautions: Fall ?Precaution Comments: Bil BKA with wound vacs/limb protectors ?Restrictions ?Weight Bearing Restrictions: Yes ?RLE Weight Bearing: Non weight bearing ?LLE Weight Bearing: Non weight bearing  ?  ? ?Mobility ? Bed Mobility ?Overal bed mobility: Needs Assistance ?  ?   ?  ?  ?  ?  ?General bed mobility comments: patient able to long sit independently but requires mod assist with scooting in bed. Limited initiation ?  ? ?Transfers ?Overall transfer level: Needs assistance ?Equipment used: None ?Transfers: Bed to chair/wheelchair/BSC ?  ?  ?  ?  ?Anterior-Posterior transfers: Mod assist, +2 physical assistance ?  ?General transfer comment: ,od +2 assist with bed pat to scoot in bed and back toward recliner. Limited initiation and effort in assisting with task. ?  ? ?Ambulation/Gait ?  ?  ?  ?  ?  ?  ?  ?General Gait Details: N/A ? ? ?Stairs ?  ?  ?  ?  ?  ? ? ?Wheelchair Mobility ?  ? ?Modified Rankin (Stroke Patients Only) ?  ? ? ?  ?Balance Overall balance assessment: Independent ?Sitting-balance support: Bilateral upper extremity supported ?Sitting balance-Leahy Scale: Good ?Sitting balance - Comments: able to sit in bed ?  ?  ?  ?  ?  ?  ?  ?  ?  ?  ?  ?  ?  ?  ?  ?  ? ?  ?Cognition Arousal/Alertness: Awake/alert ?Behavior During Therapy: Flat affect ?Overall Cognitive Status: No family/caregiver present to determine baseline cognitive functioning ?Area of Impairment: Problem solving ?  ?  ?  ?  ?  ?  ?  ?  ?  ?  ?  ?  ?  ?  ?Problem Solving: Slow processing, Decreased initiation, Requires verbal cues ?General Comments: HOH with decreased awareness of deficits needing repetition for most cues, ?  ?  ? ?  ?Exercises   ? ?  ?General Comments   ?  ?  ? ?Pertinent Vitals/Pain Pain Assessment ?Pain Assessment: Faces ?Faces Pain Scale: Hurts  little more ?Breathing: normal ?Negative Vocalization: none ?Facial Expression: smiling or inexpressive ?Body Language: relaxed ?Consolability: no need to console ?PAINAD Score: 0 ?Pain Location: B legs ?Pain Descriptors / Indicators: Discomfort, Operative site guarding ?Pain Intervention(s): Premedicated before session  ? ? ?Home Living   ?  ?  ?  ?  ?  ?  ?  ?  ?  ?   ?  ?Prior Function    ?  ?  ?   ? ?PT Goals (current goals can now be found  in the care plan section) Acute Rehab PT Goals ?Patient Stated Goal: to improve ?PT Goal Formulation: With patient ?Time For Goal Achievement: 04/19/22 ?Potential to Achieve Goals: Fair ?Progress towards PT goals: Progressing toward goals ? ?  ?Frequency ? ? ? Min 3X/week ? ? ? ?  ?PT Plan Current plan remains appropriate  ? ? ?Co-evaluation   ?  ?  ?  ?  ? ?  ?AM-PAC PT "6 Clicks" Mobility   ?Outcome Measure ? Help needed turning from your back to your side while in a flat bed without using bedrails?: None ?Help needed moving from lying on your back to sitting on the side of a flat bed without using bedrails?: A Lot ?Help needed moving to and from a bed to a chair (including a wheelchair)?: A Lot ?Help needed standing up from a chair using your arms (e.g., wheelchair or bedside chair)?: Total ?Help needed to walk in hospital room?: Total ?Help needed climbing 3-5 steps with a railing? : Total ?6 Click Score: 11 ? ?  ?End of Session   ?Activity Tolerance: Patient limited by pain;Patient limited by fatigue ?Patient left: in chair;with call bell/phone within reach ?Nurse Communication: Mobility status ?PT Visit Diagnosis: Other abnormalities of gait and mobility (R26.89);Muscle weakness (generalized) (M62.81) ?Pain - Right/Left:  (B) ?Pain - part of body: Leg ?  ? ? ?Time: 4496-7591 ?PT Time Calculation (min) (ACUTE ONLY): 11 min ? ?Charges:  $Therapeutic Activity: 8-22 mins          ?          ? ?[employer], PT, [employer] ?04/10/22,11:52 AM ? ?

## 2022-04-10 NOTE — Assessment & Plan Note (Signed)
Unclear cause.  Resolved a while ago.  Typical anti-emetics held due QTc.   ?- Stop scopolamine given resolution ?

## 2022-04-10 NOTE — Progress Notes (Signed)
Pt to d/c to snf today.Contacted [place] and spoke to [place] earlier today. Confirmed pt's schedule MWF arrive 11:25 for 11:45 chair time as of this week. Information confirmed with CSW as well to provide to snf. Contacted clinic to make clinic aware of pt's dc today and pt to resume care tomorrow.  ? ?Melven Sartorius ?Renal Navigator ?[phone] ?

## 2022-04-10 NOTE — Plan of Care (Addendum)
@  1329 Patient friend at bedside requesting information on discharge. Patient friend concerned with where the patient is going. Patient requesting that the case manager talks with patient friend about discharge. Patient friend requesting patient to go to [facility] in [place], [place] for rehab. Patient friend educated on places available depending on insurance and level of care. Schuyler Amor [phone], friend at bedside. ? ? ? ?Problem: Education: ?Goal: Knowledge of General Education information will improve ?Description: Including pain rating scale, medication(s)/side effects and non-pharmacologic comfort measures ?Outcome: Progressing ?  ?Problem: Activity: ?Goal: Risk for activity intolerance will decrease ?Outcome: Progressing ?  ?Problem: Pain Managment: ?Goal: General experience of comfort will improve ?Outcome: Progressing ?  ?Problem: Safety: ?Goal: Ability to remain free from injury will improve ?Outcome: Progressing ?  ?Problem: Skin Integrity: ?Goal: Risk for impaired skin integrity will decrease ?Outcome: Progressing ?  ?

## 2022-04-10 NOTE — Progress Notes (Signed)
?[facility] ?Progress Note  ? ?Subjective:   Patient seen and examined at bedside.  Eating breakfast.  Pain is mostly well controlled.  No additional nausea after receiving zofran with HD yesterday.  No specific complaints.  Denies CP, SOB, abdominal pain and n/v/d.  ? ?Objective ?Vitals:  ? 04/09/22 1500 04/09/22 1958 04/10/22 0500 04/10/22 0813  ?BP: 130/69 118/61  138/76  ?Pulse: 85 85  83  ?Resp: '17 18  17  '$ ?Temp: 99.1 ?F (37.3 ?C) 98.4 ?F (36.9 ?C)  98.6 ?F (37 ?C)  ?TempSrc: Oral Oral  Oral  ?SpO2: 100% 99%  99%  ?Weight:   110.3 kg   ?Height:      ? ?Physical Exam ?General:chronically ill appearing female in NAD ?Heart:RRR, no mrg ?Lungs:CTAB, nml WOB  ?Abdomen:soft, NTND ?Extremities:1+edema in hips/thighs, b/l BKA ?Dialysis Access: LU AVF +b/t, TDC  ? ?Filed Weights  ? 04/09/22 0706 04/09/22 1139 04/10/22 0500  ?Weight: 105.3 kg 103.1 kg 110.3 kg  ? ? ?Intake/Output Summary (Last 24 hours) at 04/10/2022 1005 ?Last data filed at 04/09/2022 1700 ?Gross per 24 hour  ?Intake 240 ml  ?Output 2850 ml  ?Net -2610 ml  ? ? ? ?Additional Objective ?Labs: ?Basic Metabolic Panel: ?Recent Labs  ?Lab 04/03/22 ?1043 04/04/22 ?0136 04/05/22 ?0136 04/06/22 ?0145 04/09/22 ?8588  ?NA 134*   < > 132* 133* 129*  ?K 4.0   < > 6.0* 4.4 4.1  ?CL 96*   < > 99 97* 93*  ?CO2 28   < > 21* 24 25  ?GLUCOSE 99   < > 282* 162* 140*  ?BUN 19   < > 24* 34* 36*  ?CREATININE 3.88*   < > 4.54* 5.62* 5.20*  ?CALCIUM 8.1*   < > 8.0* 8.2* 8.4*  ?PHOS 4.3  --   --   --  5.7*  ? < > = values in this interval not displayed.  ? ? ?Liver Function Tests: ?Recent Labs  ?Lab 04/03/22 ?1043 04/04/22 ?0136 04/09/22 ?0724  ?AST  --  16  --   ?ALT  --  14  --   ?ALKPHOS  --  28  --   ?BILITOT  --  0.6  --   ?PROT  --  6.3*  --   ?ALBUMIN 1.6* 1.8* 1.9*  ? ? ?CBC: ?Recent Labs  ?Lab 04/03/22 ?1042 04/04/22 ?5027 04/05/22 ?0136 04/06/22 ?0145 04/06/22 ?7412 04/07/22 ?0354 04/09/22 ?8786  ?WBC 17.0* 16.7* 13.9* 16.0*  --  15.0* 15.3*  ?NEUTROABS  12.9* 12.4*  --   --   --   --   --   ?HGB 8.5* 9.2* 10.2* 6.9* 6.7* 10.3* 9.8*  ?HCT 28.2* 29.9* 33.0* 22.4* 22.1* 31.8* 29.9*  ?MCV 90.7 89.5 89.7 91.4  --  88.8 89.8  ?PLT 323 326 322 297  --  286 304  ? ? ?CBG: ?Recent Labs  ?Lab 04/08/22 ?2156 04/09/22 ?1220 04/09/22 ?1657 04/09/22 ?2134 04/10/22 ?0710  ?GLUCAP 225* 144* 161* 255* 97  ? ? ?Medications: ? ferric gluconate (FERRLECIT) IVPB 125 mg (04/09/22 1741)  ? magnesium sulfate bolus IVPB    ? ? sodium chloride   Intravenous Once  ? sodium chloride   Intravenous Once  ? acetaminophen  1,000 mg Oral TID  ? amLODipine  5 mg Oral Daily  ? vitamin C  1,000 mg Oral Daily  ? atorvastatin  40 mg Oral Daily  ? calcium acetate  667 mg Oral TID WC  ? Chlorhexidine Gluconate Cloth  6 each Topical Q0600  ? [START ON 04/11/2022] darbepoetin (ARANESP) injection - DIALYSIS  150 mcg Intravenous Q Wed-HD  ? docusate sodium  100 mg Oral Daily  ? ezetimibe  10 mg Oral Daily  ? heparin injection (subcutaneous)  5,000 Units Subcutaneous Q8H  ? insulin aspart  0-5 Units Subcutaneous QHS  ? insulin aspart  0-9 Units Subcutaneous TID WC  ? insulin aspart  2 Units Subcutaneous TID WC  ? insulin glargine-yfgn  8 Units Subcutaneous QHS  ? multivitamin  1 tablet Oral QHS  ? nutrition supplement (JUVEN)  1 packet Oral BID BM  ? pantoprazole  40 mg Oral Daily  ? scopolamine  1 patch Transdermal Q72H  ? zinc sulfate  220 mg Oral Daily  ? ? ?Dialysis Orders: ?[facility] MWF ?  4h 57mn  400/1.5   95.5kg  2/2 bath  TDC (RUA AVF healing) Hep 2500 ? - 4/14 >  hep B Ag neg and hep B Abs were low / not protective  ? - last Hb 6.9 on 4/12 ? - mircera 200 q2, last 4/12, due 4/26 ? - venofer 100 x 10, 4 left ? - compliance= A, 2-4 kg ave uf ?   ? Home meds include - asa, lipitor, phoslo 1 ac, lantus and lispro insulin, norvasc 10, coreg 18.75 bid, risperdal 3 mg hs, prns/ vits/ supps ?  ?Assessment/ Plan: ?B foot/ heel ulcers w/ osteomyelitis + severe pvd: per vascular not candidate for revascularization.   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 DSharol Given Plan for SNF. S/p Vancomycin/Zosyn. ?ESRD - on HD MWF. Next tomorrow per regular schedule. System clotted yesterday, restart Heparin 5000 unit bolus per HD.  Required cath flo as well.   ?Vol/ HTN - BP in goal this AM. Home meds on hold.  On metoprolol prn. Lower extremity edema on exam. If weights correct not close to dry.  Attempt to maximize UF with HD.  Hypotension limited last HD. Will need lower dry weight on d/c post surgery.  ?Anemia ckd - Hgb initially drop 6.2 on admit, improved to 9-10 s/p 2 units pRBC.  Dropped again 4/21 to 6.7, now improved to 9.8 s/p 2 units pRBC with HD 4/21. Was getting IV fe and ESA at op unit. Next esa due 4/26 -ordered. Iron restarted.  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  Resume binder.  ?DM2 - on insulin, per pmd ?Nutrition - renal diet w/fluid restrictions. Alb 1.9, +protein supplements, renal vit. Encouraged nutrition.  ?Dispo - pending SNF placement ? ?LJen Mow PA-C ?[facility] ?04/10/2022,10:05 AM ? LOS: 12 days  ? ?Nephrology attending: ?I have personally seen and examined the patient.  Chart reviewed.  I agree with above. ? ?The system was clotted during dialysis yesterday therefore increased heparin dose.  Continue Aranesp, binders, iron.  Monitor lab data plan for regular dialysis tomorrow. ? ?D.  BCarolin Sicks MD ?[facility] ? ?

## 2022-04-11 ENCOUNTER — Encounter (HOSPITAL_COMMUNITY): Payer: Medicaid Other

## 2022-04-18 ENCOUNTER — Encounter: Payer: Medicare Other | Admitting: Family

## 2022-04-18 DIAGNOSIS — E440 Moderate protein-calorie malnutrition: Secondary | ICD-10-CM | POA: Insufficient documentation

## 2022-04-20 ENCOUNTER — Encounter (HOSPITAL_COMMUNITY): Payer: Medicaid Other | Admitting: Cardiology

## 2022-04-26 DIAGNOSIS — D509 Iron deficiency anemia, unspecified: Secondary | ICD-10-CM | POA: Insufficient documentation

## 2022-05-01 ENCOUNTER — Ambulatory Visit (INDEPENDENT_AMBULATORY_CARE_PROVIDER_SITE_OTHER): Payer: Medicare Other | Admitting: Family

## 2022-05-01 ENCOUNTER — Encounter: Payer: Self-pay | Admitting: Family

## 2022-05-01 DIAGNOSIS — Z89511 Acquired absence of right leg below knee: Secondary | ICD-10-CM

## 2022-05-01 DIAGNOSIS — Z89512 Acquired absence of left leg below knee: Secondary | ICD-10-CM

## 2022-05-01 NOTE — Progress Notes (Signed)
? ?Post-Op Visit Note ?  ?Patient: Nancy [name]           ?Date of Birth: February 01, 1961           ?MRN: [id number] ?Visit Date: 05/01/2022 ?PCP: [name], MD ? ?Chief Complaint:  ?Chief Complaint  ?Patient presents with  ? Right Leg - Routine Post Op  ?  04/04/22 right BKA  ? Left Leg - Routine Post Op  ?  04/04/22 left BKA  ? ? ?HPI:  ?HPI ?The patient is a 61 year old woman who presents status post bilateral below-knee amputations on April 19 wound vacs were removed today.  She is residing at [facility]. ? ?Ortho Exam ?On examination bilateral below-knee amputations these are well consolidated the incisions are well-healed there is no erythema no drainage staples do remain in place ? ?Visit Diagnoses:  ?1. S/P bilateral below knee amputation ([place])   ? ? ?Plan: Harvest staples today.  The patient will return in 2 more weeks for an incision check.  Provided her order for his prosthesis set up today. ? ?Follow-Up Instructions: No follow-ups on file.  ? ?Imaging: ?No results found. ? ?Orders:  ?No orders of the defined types were placed in this encounter. ? ?No orders of the defined types were placed in this encounter. ? ? ? ?PMFS History: ?Patient Active Problem List  ? Diagnosis Date Noted  ? Nausea 04/10/2022  ? Hyponatremia 04/04/2022  ? Prolonged QT interval 04/04/2022  ? Pressure injury of skin 02/19/2022  ? Renal anasarca 02/14/2022  ? ESRD on dialysis [facility]) 02/14/2022  ? Hyperkalemia 02/14/2022  ? S/P arteriovenous (AV) fistula creation 12/22/2021  ? Critical limb ischemia of both lower extremities ([place]) 11/22/2021  ? Subacute osteomyelitis, right ankle and foot ([place])   ? Peripheral artery disease ([place]) 10/27/2021  ? Acute exacerbation of congestive heart failure ([place]) 10/24/2021  ? Acute cardiogenic pulmonary edema ([place]) 10/06/2021  ? CAD (coronary artery disease) 10/06/2021  ? Goals of care, counseling/discussion   ? Gangrene of left foot ([place])   ? Cellulitis of left foot   ? Diabetic foot  ulcers ([place]) 09/04/2021  ? CHF (congestive heart failure) ([place]) 07/07/2021  ? Acute postoperative anemia due to expected blood loss superimposed on anemia of chronic renal insufficiency 06/30/2021  ? CKD (chronic kidney disease), stage IV ([place]) 06/11/2021  ? Chronic diastolic CHF (congestive heart failure) ([place]) 06/10/2021  ? Abnormal nuclear stress test   ? Dyspnea on exertion 03/07/2021  ? Orthopnea 02/25/2021  ? History of cerebrovascular accident (CVA) with residual deficit 05/06/2020  ? Diabetic peripheral neuropathy ([place]) 04/26/2020  ? AKI (acute kidney injury) ([place]) 04/20/2020  ? Generalized weakness 04/20/2020  ? Dehydration 04/20/2020  ? Generalized abdominal pain   ? Pancreatitis, acute 02/29/2020  ? Cerebrovascular accident (CVA) ([place]) 11/08/2019  ? Stenosis of right carotid artery 11/08/2019  ? Mixed diabetic hyperlipidemia associated with type 2 diabetes mellitus ([place]) 11/08/2019  ? Cortical age-related cataract of both eyes 08/30/2019  ? Gait abnormality 08/30/2019  ? Statin declined 08/30/2019  ? Gastroesophageal reflux disease without esophagitis 04/18/2018  ? 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 Insulin dependent type 2 diabetes mellitus ([place]) 10/29/2017  ? History of macular degeneration 10/29/2017  ? Chronic cervical pain 10/29/2016  ? Myalgia 09/14/2016  ? Insomnia 09/14/2016  ? Tinea pedis of both feet 08/20/2016  ? Macular degeneration 08/17/2016  ? Low back pain 09/21/2014  ? Hypertensive urgency 09/21/2014  ? Diabetic gastroparesis associated with type 2 diabetes mellitus (  [place]) 09/14/2014  ? HTN (hypertension) 09/08/2014  ? Thyroid nodule 08/17/2014  ? Morbid obesity ([place]) 08/17/2014  ? Hypokalemia 08/06/2014  ? Type II diabetes mellitus with neurological manifestations, uncontrolled 08/04/2014  ? ?Past Medical History:  ?Diagnosis Date  ? Anemia 2006  ? Carotid artery disease ([place])   ? right ICA occlusion, 16-10% LICA 08/6044 [place]  ? CHF (congestive heart failure) ([place])   ? Chronic kidney disease   ?  Coronary artery disease   ? Depression 2014  ? previously on amitryptiline   ? Diabetes mellitus with neurological manifestation ([place]) 2006  ? Diabetic peripheral neuropathy ([place]) 04/26/2020  ? Dyspnea   ? Fracture of left ankle 1997  ? Gastroparesis 07/2016  ? Heart murmur   ? HOH (hard of hearing) 2004  ? Hyperlipidemia 2006  ? Hypertension 2006  ? IBS (irritable bowel syndrome) 2002  ? Leukopenia 2015  ? Macular degeneration 11/2019  ? Shingles 2009  ? Stroke [facility]) 2020  ? Thyroid nodule 2004  ?  ?Family History  ?Problem Relation Age of Onset  ? Hypertension Mother   ? Heart disease Mother   ? Diabetes Mother   ? Thyroid disease Mother   ? Congestive Heart Failure Mother   ? Breast cancer Maternal Grandmother   ? Colon cancer Maternal Grandfather   ? Heart attack Sister   ? Heart disease Brother   ? Hyperlipidemia Brother   ? Hypertension Brother   ? Diabetes Father   ? Breast cancer Maternal Aunt   ?  ?Past Surgical History:  ?Procedure Laterality Date  ? ABDOMINAL HYSTERECTOMY  2005  ? AMPUTATION Bilateral 04/04/2022  ? Procedure: BILATERAL BELOW KNEE AMPUTATION;  Surgeon: Newt Minion, MD;  Location: [place];  Service: Orthopedics;  Laterality: Bilateral;  ? AV FISTULA PLACEMENT Right 12/22/2021  ? Procedure: RIGHT ARM ARTERIOVENOUS (AV) [place];  Surgeon: Waynetta Sandy, MD;  Location: [place];  Service: Vascular;  Laterality: Right;  ? AV FISTULA PLACEMENT Right 02/19/2022  ? Procedure: RIGHT ARTERIOVENOUS FISTULA;  Surgeon: Angelia Mould, MD;  Location: [place];  Service: Vascular;  Laterality: Right;  ? CATARACT EXTRACTION Left 11/2019  ? [place]   ? CHOLECYSTECTOMY N/A 03/05/2020  ? Procedure: LAPAROSCOPIC CHOLECYSTECTOMY WITH INTRAOPERATIVE CHOLANGIOGRAM;  Surgeon: Donnie Mesa, MD;  [facility];  Service: General;  Laterality: N/A;  ? ESOPHAGOGASTRODUODENOSCOPY (EGD) WITH PROPOFOL Left 08/26/2014  ? Procedure: ESOPHAGOGASTRODUODENOSCOPY (EGD) WITH  PROPOFOL;  Surgeon: Arta Silence, MD;  [facility];  Service: Endoscopy;  Laterality: Left;  ? ESOPHAGOGASTRODUODENOSCOPY (EGD) WITH PROPOFOL N/A 03/03/2020  ? Procedure: ESOPHAGOGASTRODUODENOSCOPY (EGD) WITH PROPOFOL;  Surgeon: Jerene Bears, MD;  [facility];  Service: Gastroenterology;  Laterality: N/A;  ? IR FLUORO GUIDE CV LINE RIGHT  02/15/2022  ? IR US GUIDE VASC ACCESS RIGHT  02/15/2022  ? RIGHT HEART CATH N/A 07/14/2021  ? Procedure: RIGHT HEART CATH;  Surgeon: Larey Dresser, MD;  Location: [facility];  Service: Cardiovascular;  Laterality: N/A;  ? RIGHT/LEFT HEART CATH AND CORONARY ANGIOGRAPHY N/A 04/27/2021  ? Procedure: RIGHT/LEFT HEART CATH AND CORONARY ANGIOGRAPHY;  Surgeon: [name], MD;  Location: [facility] [facility];  Service: Cardiovascular;  Laterality: N/A;  ? ?Social History  ? ?Occupational History  ? Occupation: Employed FT as Landscape architect  ?  Comment: [employer] , [place]  ?Tobacco Use  ? Smoking status: Former  ?  Packs/day: 0.25  ?  Years: 0.50  ?  Pack years: 0.13  ?  Types: Cigarettes  ? Smokeless tobacco: Never  ? Tobacco comments:  ?  Former smoker 11/03/21  ?Vaping Use  ? Vaping Use: Never used  ?Substance and Sexual Activity  ? Alcohol use: No  ? Drug use: No  ? Sexual activity: Yes  ?  Birth control/protection: None  ? ? ? ?

## 2022-05-10 ENCOUNTER — Encounter: Payer: Self-pay | Admitting: Orthopedic Surgery

## 2022-05-10 ENCOUNTER — Ambulatory Visit (INDEPENDENT_AMBULATORY_CARE_PROVIDER_SITE_OTHER): Payer: Medicare Other | Admitting: Orthopedic Surgery

## 2022-05-10 DIAGNOSIS — Z89512 Acquired absence of left leg below knee: Secondary | ICD-10-CM

## 2022-05-10 DIAGNOSIS — Z89511 Acquired absence of right leg below knee: Secondary | ICD-10-CM

## 2022-05-10 NOTE — Progress Notes (Signed)
Office Visit Note   Patient: Nancy [name]           Date of Birth: 1961/11/01           MRN: [id number] Visit Date: 05/10/2022              Requested by: [name], MD [address] [place],  [place] [address] PCP: [name], MD  Chief Complaint  Patient presents with   Left Leg - Routine Post Op    04/04/22 left BKA   Right Leg - Routine Post Op    04/04/22 right BKA      HPI: Patient is a 61 year old woman who is about 5 weeks status post bilateral below-knee amputations.  She is currently at skilled nursing using the stump shrinker's and limb protectors.  Assessment & Plan: Visit Diagnoses:  1. S/P bilateral below knee amputation ([place])     Plan: Patient is given a prescription for [employer] for bilateral K2 prosthesis.  She may discontinue the limb protectors and continue the stump shrinker's.  Follow-Up Instructions: Return in about 3 months (around 08/10/2022).   Ortho Exam  Patient is alert, oriented, no adenopathy, well-dressed, normal affect, normal respiratory effort. Examination the legs are well-healed and consolidated she has full active extension.  No drainage no open wounds.  Patient is a new bilateral transtibial  amputee.  Patient's current comorbidities are not expected to impact the ability to function with the prescribed prosthesis. Patient verbally communicates a strong desire to use a prosthesis. Patient currently requires mobility aids to ambulate without a prosthesis.  Expects not to use mobility aids with a new prosthesis.  Patient is a K2 level ambulator that will use a prosthesis to walk around their home and the community over low level environmental barriers.      Imaging: No results found.   Labs: Lab Results  Component Value Date   HGBA1C 6.3 (H) 02/16/2022   HGBA1C 5.3 12/23/2021   HGBA1C 6.8 (H) 09/04/2021   REPTSTATUS 04/03/2022 FINAL 03/29/2022   GRAMSTAIN  02/05/2022    RARE WBC PRESENT,  PREDOMINANTLY MONONUCLEAR RARE GRAM NEGATIVE RODS Performed at [facility], [place] [address] [place], [place] [address]    CULT  03/29/2022    NO GROWTH 5 DAYS Performed at [facility] [address], [place], [place] [address]    [facility] KOSERI 02/05/2022     Lab Results  Component Value Date   ALBUMIN 1.9 (L) 04/09/2022   ALBUMIN 1.8 (L) 04/04/2022   ALBUMIN 1.6 (L) 04/03/2022   PREALBUMIN 8.4 (L) 04/04/2022    Lab Results  Component Value Date   MG 2.0 04/04/2022   MG 2.1 03/30/2022   MG 2.7 (H) 02/14/2022   Lab Results  Component Value Date   VD25OH 14.28 (L) 04/04/2022    Lab Results  Component Value Date   PREALBUMIN 8.4 (L) 04/04/2022      Latest Ref Rng & Units 04/09/2022    7:24 AM 04/07/2022    3:54 AM 04/06/2022    5:23 AM  CBC EXTENDED  WBC 4.0 - 10.5 K/uL 15.3   15.0     RBC 3.87 - 5.11 MIL/uL 3.33   3.58     Hemoglobin 12.0 - 15.0 g/dL 9.8   10.3   6.7    HCT 36.0 - 46.0 % 29.9   31.8   22.1    Platelets 150 - 400 K/uL 304  286        There is no height or weight on file to calculate BMI.  Orders:  No orders of the defined types were placed in this encounter.  No orders of the defined types were placed in this encounter.    Procedures: No procedures performed  Clinical Data: No additional findings.  ROS:  All other systems negative, except as noted in the HPI. Review of Systems  Objective: Vital Signs: There were no vitals taken for this visit.  Specialty Comments:  No specialty comments available.  PMFS History: Patient Active Problem List   Diagnosis Date Noted   Nausea 04/10/2022   Hyponatremia 04/04/2022   Prolonged QT interval 04/04/2022   Pressure injury of skin 02/19/2022   Renal anasarca 02/14/2022   ESRD on dialysis [facility]) 02/14/2022   Hyperkalemia 02/14/2022   S/P arteriovenous (AV) fistula creation 12/22/2021   Critical limb ischemia of both lower extremities ([place]) 11/22/2021    Subacute osteomyelitis, right ankle and foot ([place])    Peripheral artery disease ([place]) 10/27/2021   Acute exacerbation of congestive heart failure ([place]) 10/24/2021   Acute cardiogenic pulmonary edema ([place]) 10/06/2021   CAD (coronary artery disease) 10/06/2021   Goals of care, counseling/discussion    Gangrene of left foot ([place])    Cellulitis of left foot    Diabetic foot ulcers ([place]) 09/04/2021   CHF (congestive heart failure) ([place]) 07/07/2021   Acute postoperative anemia due to expected blood loss superimposed on anemia of chronic renal insufficiency 06/30/2021   CKD (chronic kidney disease), stage IV (HCC) 06/11/2021   Chronic diastolic CHF (congestive heart failure) ([place]) 06/10/2021   Abnormal nuclear stress test    Dyspnea on exertion 03/07/2021   Orthopnea 02/25/2021   History of cerebrovascular accident (CVA) with residual deficit 05/06/2020   Diabetic peripheral neuropathy ([place]) 04/26/2020   AKI (acute kidney injury) ([place]) 04/20/2020   Generalized weakness 04/20/2020   Dehydration 04/20/2020   Generalized abdominal pain    Pancreatitis, acute 02/29/2020   Cerebrovascular accident (CVA) ([place]) 11/08/2019   Stenosis of right carotid artery 11/08/2019   Mixed diabetic hyperlipidemia associated with type 2 diabetes mellitus ([place]) 11/08/2019   Cortical age-related cataract of both eyes 08/30/2019   Gait abnormality 08/30/2019   Statin declined 08/30/2019   Gastroesophageal reflux disease without esophagitis 04/18/2018   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) 10/29/2017   History of macular degeneration 10/29/2017   Chronic cervical pain 10/29/2016   Myalgia 09/14/2016   Insomnia 09/14/2016   Tinea pedis of both feet 08/20/2016   Macular degeneration 08/17/2016   Low back pain 09/21/2014   Hypertensive urgency 09/21/2014   Diabetic gastroparesis associated with type 2 diabetes mellitus ([place]) 09/14/2014   HTN (hypertension) 09/08/2014    Thyroid nodule 08/17/2014   Morbid obesity ([place]) 08/17/2014   Hypokalemia 08/06/2014   Type II diabetes mellitus with neurological manifestations, uncontrolled 08/04/2014   Past Medical History:  Diagnosis Date   Anemia 2006   Carotid artery disease ([place])    right ICA occlusion, 17-79% LICA 02/9029 [place]   CHF (congestive heart failure) ([place])    Chronic kidney disease    Coronary artery disease    Depression 2014   previously on amitryptiline    Diabetes mellitus with neurological manifestation ([place]) 2006   Diabetic peripheral neuropathy ([place]) 04/26/2020   Dyspnea    Fracture of left ankle 1997   Gastroparesis 07/2016   Heart murmur    HOH (hard  of hearing) 2004   Hyperlipidemia 2006   Hypertension 2006   IBS (irritable bowel syndrome) 2002   Leukopenia 2015   Macular degeneration 11/2019   Shingles 2009   Stroke [facility]) 2020   Thyroid nodule 2004    Family History  Problem Relation Age of Onset   Hypertension Mother    Heart disease Mother    Diabetes Mother    Thyroid disease Mother    Congestive Heart Failure Mother    Breast cancer Maternal Grandmother    Colon cancer Maternal Grandfather    Heart attack Sister    Heart disease Brother    Hyperlipidemia Brother    Hypertension Brother    Diabetes Father    Breast cancer Maternal Aunt     Past Surgical History:  Procedure Laterality Date   ABDOMINAL HYSTERECTOMY  2005   AMPUTATION Bilateral 04/04/2022   Procedure: BILATERAL BELOW KNEE AMPUTATION;  Surgeon: Newt Minion, MD;  Location: [place];  Service: Orthopedics;  Laterality: Bilateral;   AV FISTULA PLACEMENT Right 12/22/2021   Procedure: RIGHT ARM ARTERIOVENOUS (AV) BRACIOCPHELAIC FISTULA CREATION;  Surgeon: Waynetta Sandy, MD;  Location: [place];  Service: Vascular;  Laterality: Right;   AV FISTULA PLACEMENT Right 02/19/2022   Procedure: RIGHT ARTERIOVENOUS FISTULA;  Surgeon: Angelia Mould, MD;  Location: [place];  Service: Vascular;  Laterality:  Right;   CATARACT EXTRACTION Left 11/2019   CESAREAN SECTION  1983    CHOLECYSTECTOMY N/A 03/05/2020   Procedure: LAPAROSCOPIC CHOLECYSTECTOMY WITH INTRAOPERATIVE CHOLANGIOGRAM;  Surgeon: Donnie Mesa, MD;  [facility];  Service: General;  Laterality: N/A;   ESOPHAGOGASTRODUODENOSCOPY (EGD) WITH PROPOFOL Left 08/26/2014   Procedure: ESOPHAGOGASTRODUODENOSCOPY (EGD) WITH PROPOFOL;  Surgeon: Arta Silence, MD;  [facility];  Service: Endoscopy;  Laterality: Left;   ESOPHAGOGASTRODUODENOSCOPY (EGD) WITH PROPOFOL N/A 03/03/2020   Procedure: ESOPHAGOGASTRODUODENOSCOPY (EGD) WITH PROPOFOL;  Surgeon: Jerene Bears, MD;  [facility];  Service: Gastroenterology;  Laterality: N/A;   IR FLUORO GUIDE CV LINE RIGHT  02/15/2022   IR US GUIDE VASC ACCESS RIGHT  02/15/2022   RIGHT HEART CATH N/A 07/14/2021   Procedure: RIGHT HEART CATH;  Surgeon: Larey Dresser, MD;  Location: [facility];  Service: Cardiovascular;  Laterality: N/A;   RIGHT/LEFT HEART CATH AND CORONARY ANGIOGRAPHY N/A 04/27/2021   Procedure: RIGHT/LEFT HEART CATH AND CORONARY ANGIOGRAPHY;  Surgeon: [name], MD;  Location: [place] [facility];  Service: Cardiovascular;  Laterality: N/A;   Social History   Occupational History   Occupation: Employed FT as Landscape architect    Comment: [employer] , [place]  Tobacco Use   Smoking status: Former    Packs/day: 0.25    Years: 0.50    Pack years: 0.13    Types: Cigarettes   Smokeless tobacco: Never   Tobacco comments:    Former smoker 11/03/21  Vaping Use   Vaping Use: Never used  Substance and Sexual Activity   Alcohol use: No   Drug use: No   Sexual activity: Yes    Birth control/protection: None

## 2022-05-18 ENCOUNTER — Ambulatory Visit: Payer: Medicaid Other | Admitting: Cardiovascular Disease

## 2022-05-23 ENCOUNTER — Other Ambulatory Visit (HOSPITAL_COMMUNITY): Payer: Self-pay

## 2022-05-23 MED ORDER — ONDANSETRON HCL 8 MG PO TABS
8.0000 mg | ORAL_TABLET | Freq: Three times a day (TID) | ORAL | 0 refills | Status: DC | PRN
  Filled 2022-05-23 – 2023-05-17 (×2): qty 42, 14d supply, fill #0

## 2022-05-29 ENCOUNTER — Other Ambulatory Visit: Payer: Self-pay

## 2022-05-29 ENCOUNTER — Emergency Department (HOSPITAL_COMMUNITY): Payer: Medicare Other

## 2022-05-29 ENCOUNTER — Encounter (HOSPITAL_COMMUNITY): Payer: Self-pay | Admitting: *Deleted

## 2022-05-29 ENCOUNTER — Inpatient Hospital Stay (HOSPITAL_COMMUNITY)
Admission: EM | Admit: 2022-05-29 | Discharge: 2022-06-04 | DRG: 291 | Disposition: A | Discharge status: Skilled Nursing Facility | Payer: Medicare Other | Source: Skilled Nursing Facility | Attending: Internal Medicine | Admitting: Internal Medicine

## 2022-05-29 DIAGNOSIS — I2510 Atherosclerotic heart disease of native coronary artery without angina pectoris: Secondary | ICD-10-CM | POA: Diagnosis present

## 2022-05-29 DIAGNOSIS — I6521 Occlusion and stenosis of right carotid artery: Secondary | ICD-10-CM | POA: Diagnosis present

## 2022-05-29 DIAGNOSIS — Z885 Allergy status to narcotic agent status: Secondary | ICD-10-CM

## 2022-05-29 DIAGNOSIS — N186 End stage renal disease: Secondary | ICD-10-CM

## 2022-05-29 DIAGNOSIS — E1143 Type 2 diabetes mellitus with diabetic autonomic (poly)neuropathy: Secondary | ICD-10-CM | POA: Diagnosis present

## 2022-05-29 DIAGNOSIS — Z7982 Long term (current) use of aspirin: Secondary | ICD-10-CM

## 2022-05-29 DIAGNOSIS — Z803 Family history of malignant neoplasm of breast: Secondary | ICD-10-CM

## 2022-05-29 DIAGNOSIS — R079 Chest pain, unspecified: Principal | ICD-10-CM | POA: Diagnosis present

## 2022-05-29 DIAGNOSIS — Z89511 Acquired absence of right leg below knee: Secondary | ICD-10-CM

## 2022-05-29 DIAGNOSIS — N2581 Secondary hyperparathyroidism of renal origin: Secondary | ICD-10-CM | POA: Diagnosis present

## 2022-05-29 DIAGNOSIS — E119 Type 2 diabetes mellitus without complications: Secondary | ICD-10-CM

## 2022-05-29 DIAGNOSIS — Z882 Allergy status to sulfonamides status: Secondary | ICD-10-CM

## 2022-05-29 DIAGNOSIS — E669 Obesity, unspecified: Secondary | ICD-10-CM | POA: Diagnosis present

## 2022-05-29 DIAGNOSIS — D631 Anemia in chronic kidney disease: Secondary | ICD-10-CM | POA: Diagnosis present

## 2022-05-29 DIAGNOSIS — Z833 Family history of diabetes mellitus: Secondary | ICD-10-CM

## 2022-05-29 DIAGNOSIS — Z79899 Other long term (current) drug therapy: Secondary | ICD-10-CM

## 2022-05-29 DIAGNOSIS — I10 Essential (primary) hypertension: Secondary | ICD-10-CM

## 2022-05-29 DIAGNOSIS — E1151 Type 2 diabetes mellitus with diabetic peripheral angiopathy without gangrene: Secondary | ICD-10-CM | POA: Diagnosis present

## 2022-05-29 DIAGNOSIS — J810 Acute pulmonary edema: Secondary | ICD-10-CM | POA: Diagnosis present

## 2022-05-29 DIAGNOSIS — E1149 Type 2 diabetes mellitus with other diabetic neurological complication: Secondary | ICD-10-CM | POA: Diagnosis present

## 2022-05-29 DIAGNOSIS — I509 Heart failure, unspecified: Secondary | ICD-10-CM

## 2022-05-29 DIAGNOSIS — I132 Hypertensive heart and chronic kidney disease with heart failure and with stage 5 chronic kidney disease, or end stage renal disease: Principal | ICD-10-CM | POA: Diagnosis present

## 2022-05-29 DIAGNOSIS — Z8349 Family history of other endocrine, nutritional and metabolic diseases: Secondary | ICD-10-CM

## 2022-05-29 DIAGNOSIS — E1122 Type 2 diabetes mellitus with diabetic chronic kidney disease: Secondary | ICD-10-CM | POA: Diagnosis present

## 2022-05-29 DIAGNOSIS — Z8673 Personal history of transient ischemic attack (TIA), and cerebral infarction without residual deficits: Secondary | ICD-10-CM

## 2022-05-29 DIAGNOSIS — Z794 Long term (current) use of insulin: Secondary | ICD-10-CM

## 2022-05-29 DIAGNOSIS — Z8249 Family history of ischemic heart disease and other diseases of the circulatory system: Secondary | ICD-10-CM

## 2022-05-29 DIAGNOSIS — Z6841 Body Mass Index (BMI) 40.0 and over, adult: Secondary | ICD-10-CM

## 2022-05-29 DIAGNOSIS — Z888 Allergy status to other drugs, medicaments and biological substances status: Secondary | ICD-10-CM

## 2022-05-29 DIAGNOSIS — Z9049 Acquired absence of other specified parts of digestive tract: Secondary | ICD-10-CM

## 2022-05-29 DIAGNOSIS — Z66 Do not resuscitate: Secondary | ICD-10-CM | POA: Diagnosis present

## 2022-05-29 DIAGNOSIS — Z800 Family history of malignant neoplasm of digestive organs: Secondary | ICD-10-CM

## 2022-05-29 DIAGNOSIS — I639 Cerebral infarction, unspecified: Secondary | ICD-10-CM | POA: Diagnosis present

## 2022-05-29 DIAGNOSIS — E785 Hyperlipidemia, unspecified: Secondary | ICD-10-CM | POA: Diagnosis present

## 2022-05-29 DIAGNOSIS — K219 Gastro-esophageal reflux disease without esophagitis: Secondary | ICD-10-CM | POA: Diagnosis present

## 2022-05-29 DIAGNOSIS — G8929 Other chronic pain: Secondary | ICD-10-CM | POA: Diagnosis present

## 2022-05-29 DIAGNOSIS — E1142 Type 2 diabetes mellitus with diabetic polyneuropathy: Secondary | ICD-10-CM | POA: Diagnosis present

## 2022-05-29 DIAGNOSIS — H3530 Unspecified macular degeneration: Secondary | ICD-10-CM | POA: Diagnosis present

## 2022-05-29 DIAGNOSIS — Z89512 Acquired absence of left leg below knee: Secondary | ICD-10-CM

## 2022-05-29 DIAGNOSIS — I5033 Acute on chronic diastolic (congestive) heart failure: Secondary | ICD-10-CM | POA: Diagnosis present

## 2022-05-29 DIAGNOSIS — M549 Dorsalgia, unspecified: Secondary | ICD-10-CM | POA: Diagnosis present

## 2022-05-29 DIAGNOSIS — I160 Hypertensive urgency: Secondary | ICD-10-CM | POA: Diagnosis present

## 2022-05-29 DIAGNOSIS — K3184 Gastroparesis: Secondary | ICD-10-CM | POA: Diagnosis present

## 2022-05-29 DIAGNOSIS — Z87891 Personal history of nicotine dependence: Secondary | ICD-10-CM

## 2022-05-29 DIAGNOSIS — Z992 Dependence on renal dialysis: Secondary | ICD-10-CM

## 2022-05-29 DIAGNOSIS — Z90710 Acquired absence of both cervix and uterus: Secondary | ICD-10-CM

## 2022-05-29 DIAGNOSIS — E8770 Fluid overload, unspecified: Secondary | ICD-10-CM

## 2022-05-29 LAB — BASIC METABOLIC PANEL
Anion gap: 12 (ref 5–15)
BUN: 11 mg/dL (ref 8–23)
CO2: 30 mmol/L (ref 22–32)
Calcium: 9.8 mg/dL (ref 8.9–10.3)
Chloride: 96 mmol/L — ABNORMAL LOW (ref 98–111)
Creatinine, Ser: 2.42 mg/dL — ABNORMAL HIGH (ref 0.44–1.00)
GFR, Estimated: 22 mL/min — ABNORMAL LOW (ref >60)
Glucose, Bld: 161 mg/dL — ABNORMAL HIGH (ref 70–99)
Potassium: 4 mmol/L (ref 3.5–5.1)
Sodium: 138 mmol/L (ref 135–145)

## 2022-05-29 LAB — CBC
HCT: 46.4 % — ABNORMAL HIGH (ref 36.0–46.0)
Hemoglobin: 14.3 g/dL (ref 12.0–15.0)
MCH: 26.9 pg (ref 26.0–34.0)
MCHC: 30.8 g/dL (ref 30.0–36.0)
MCV: 87.4 fL (ref 80.0–100.0)
Platelets: 249 10*3/uL (ref 150–400)
RBC: 5.31 MIL/uL — ABNORMAL HIGH (ref 3.87–5.11)
RDW: 14.5 % (ref 11.5–15.5)
WBC: 9.2 10*3/uL (ref 4.0–10.5)
nRBC: 0 % (ref 0.0–0.2)

## 2022-05-29 LAB — TROPONIN I (HIGH SENSITIVITY): Troponin I (High Sensitivity): 17 ng/L (ref <18)

## 2022-05-29 MED ORDER — PROCHLORPERAZINE EDISYLATE 10 MG/2ML IJ SOLN
5.0000 mg | Freq: Once | INTRAMUSCULAR | Status: AC
  Administered 2022-05-30: 5 mg via INTRAVENOUS
  Filled 2022-05-29: qty 2

## 2022-05-29 MED ORDER — LABETALOL HCL 5 MG/ML IV SOLN
10.0000 mg | Freq: Once | INTRAVENOUS | Status: AC
  Administered 2022-05-29: 10 mg via INTRAVENOUS
  Filled 2022-05-29: qty 4

## 2022-05-29 MED ORDER — NITROGLYCERIN 0.4 MG SL SUBL
0.4000 mg | SUBLINGUAL_TABLET | Freq: Once | SUBLINGUAL | Status: AC
  Administered 2022-05-29: 0.4 mg via SUBLINGUAL
  Filled 2022-05-29: qty 1

## 2022-05-29 MED ORDER — METOCLOPRAMIDE HCL 5 MG/ML IJ SOLN
10.0000 mg | Freq: Once | INTRAMUSCULAR | Status: AC
  Administered 2022-05-29: 10 mg via INTRAVENOUS
  Filled 2022-05-29: qty 2

## 2022-05-29 MED ORDER — FENTANYL CITRATE PF 50 MCG/ML IJ SOSY
50.0000 ug | PREFILLED_SYRINGE | Freq: Once | INTRAMUSCULAR | Status: AC
  Administered 2022-05-29: 50 ug via INTRAVENOUS
  Filled 2022-05-29: qty 1

## 2022-05-29 NOTE — ED Triage Notes (Signed)
Pt from [place] with EMS, c/o CP that started at [place] today with NV. Pt last dialyzed on Monday, due for treatment on Wednesday. Fistula to R arm. Pt vomiting on arrival. Reports hard IV stick, requiring IV team, has been stuck with EMS x2

## 2022-05-29 NOTE — ED Notes (Signed)
Pt reporting return of nausea with intermittent dry heaves. Pt chest pain ceased post SL nitro and has not returned. Back pain ceased post 50 mcg fentanyl

## 2022-05-29 NOTE — ED Provider Notes (Signed)
[facility] EMERGENCY DEPARTMENT Provider Note   CSN: [id number] Arrival date & time: 05/29/22  2033     History  Chief Complaint  Patient presents with   Chest Pain    Nancy [name] is a 61 y.o. female.  Presented to the emergency room due to concern for chest pain.  Patient reports that she is feeling generally unwell today, having nausea and vomiting.  This evening she started having some chest pain.  Later also developed some back pain.  Pain does not radiate between them.  Chest pain is up to 9 out of 10 in severity.  Pressure, central.  Vomit is nonbloody nonbilious.  No fevers or chills.  Patient was recently admitted to the hospital for osteomyelitis of her left foot.  She has undergone bilateral below-knee amputations.  She also has a history of end-stage renal disease and a couple months ago was started on dialysis.  Additional history obtained from review of recent discharge summary.  Additional history obtained from review of last heart catheterization-May 2022 - Ost LAD to Prox LAD lesion is 40% stenosed. Mid LAD lesion is 50% stenosed. Mid LAD to Dist LAD lesion is 40% stenosed.  HPI     Home Medications Prior to Admission medications   Medication Sig Start Date End Date Taking? Authorizing Provider  amLODipine (NORVASC) 5 MG tablet Take 1 tablet (5 mg total) by mouth daily. 04/11/22  Yes Danford, Suann Larry, MD  aspirin 81 MG EC tablet Take 1 tablet (81 mg total) by mouth daily. 09/26/21  Yes Milford, Maricela Bo, FNP  atorvastatin (LIPITOR) 40 MG tablet Take 1 tablet (40 mg total) by mouth daily. 11/07/21  Yes Larey Dresser, MD  calcium acetate (PHOSLO) 667 MG capsule TAKE 1 CAPSULE (667 MG TOTAL) BY MOUTH 3 (THREE) TIMES DAILY WITH MEALS. 02/08/22  Yes [name], MD  ezetimibe (ZETIA) 10 MG tablet Take 1 tablet (10 mg total) by mouth daily. 04/11/22  Yes Danford, Suann Larry, MD  ferrous sulfate 325 (65 FE) MG tablet Take 1  tablet (325 mg total) by mouth daily with breakfast. 11/07/21  Yes [name], MD  HYDROmorphone (DILAUDID) 2 MG tablet Take 1-1.5 tablets (2-3 mg total) by mouth every 4 (four) hours as needed for severe pain (pain score 7-10). 04/10/22  Yes Danford, Suann Larry, MD  insulin glargine (LANTUS) 100 UNIT/ML Solostar Pen Inject 10 Units into the skin at bedtime. Patient taking differently: Inject 15 Units into the skin at bedtime. 04/10/22  Yes Danford, Suann Larry, MD  insulin lispro (HUMALOG KWIKPEN) 100 UNIT/ML KwikPen Inject 3 units before breakfast and dinner. Patient taking differently: Inject 4 Units into the skin 2 (two) times daily with a meal. 04/10/22  Yes Danford, Suann Larry, MD  multivitamin (RENA-VIT) TABS tablet Take 1 tablet by mouth at bedtime. 04/10/22  Yes Danford, Suann Larry, MD  ondansetron (ZOFRAN) 8 MG tablet Take 1 tablet (8 mg total) by mouth every 8 (eight) hours as needed for nausea 05/23/22  Yes   pantoprazole (PROTONIX) 40 MG tablet Take 1 tablet (40 mg total) by mouth daily. 04/11/22  Yes Danford, Suann Larry, MD  zinc sulfate 220 (50 Zn) MG capsule Take 1 capsule (220 mg total) by mouth daily. 04/11/22  Yes Danford, Suann Larry, MD  ascorbic acid (VITAMIN C) 1000 MG tablet Take 1 tablet (1,000 mg total) by mouth daily. 04/11/22   Danford, Suann Larry, MD  Blood Glucose Monitoring Suppl [facility] VERIO) w/Device  KIT Check blood sugar three times daily. E11.40 05/06/20   [name], MD  Continuous Blood Gluc Receiver (DEXCOM G6 RECEIVER) DEVI 1 Device by Does not apply route daily. 11/15/21   [name], MD  Continuous Blood Gluc Sensor (DEXCOM G6 SENSOR) MISC 1 packet by Does not apply route daily. 11/15/21   [name], MD  Continuous Blood Gluc Transmit (DEXCOM G6 TRANSMITTER) MISC 1 packet by Does not apply route daily. 11/15/21   [name], MD  glucose blood test strip Use as instructedCheck blood sugar three times daily.  E11.40 07/11/20   [name], MD  insulin glargine-yfgn (SEMGLEE) 100 UNIT/ML Pen Inject into the skin. 05/03/22   [provider]  Insulin Pen Needle 32G X 4 MM MISC Use to inject insulin as directed. 11/07/21   [name], MD  promethazine (PHENERGAN) 25 MG tablet Take 25-50 mg by mouth every 8 (eight) hours as needed. 04/23/22   [provider]  Insulin NPH [place] & Regular (RELION 70/30 [place]) Inject 35 Units into the skin 2 (two) times daily.  08/17/14  [provider]      Allergies    Hydralazine hcl, Hydrocodone, Metformin and related, Other, Plaquenil [hydroxychloroquine sulfate], Shellfish-derived products, Shrimp (diagnostic), and Sulfa antibiotics    Review of Systems   Review of Systems  Constitutional:  Negative for chills and fever.  HENT:  Negative for ear pain and sore throat.   Eyes:  Negative for pain and visual disturbance.  Respiratory:  Negative for cough and shortness of breath.   Cardiovascular:  Positive for chest pain. Negative for palpitations.  Gastrointestinal:  Positive for nausea and vomiting. Negative for abdominal pain.  Genitourinary:  Negative for dysuria and hematuria.  Musculoskeletal:  Positive for back pain. Negative for arthralgias.  Skin:  Negative for color change and rash.  Neurological:  Negative for seizures and syncope.  All other systems reviewed and are negative.   Physical Exam Updated Vital Signs BP (!) 201/84   Pulse 100   Temp 97.9 F (36.6 C) (Oral)   Resp 16   SpO2 97%  Physical Exam Vitals and nursing note reviewed.  Constitutional:      Comments: Appears uncomfortable   HENT:     Head: Normocephalic and atraumatic.  Eyes:     Conjunctiva/sclera: Conjunctivae normal.  Cardiovascular:     Rate and Rhythm: Normal rate and regular rhythm.     Heart sounds: No murmur heard. Pulmonary:     Effort: Pulmonary effort is normal. No respiratory distress.     Breath sounds: Normal breath  sounds.  Abdominal:     Palpations: Abdomen is soft.     Tenderness: There is no abdominal tenderness.  Musculoskeletal:        General: No swelling.     Cervical back: Neck supple.  Skin:    General: Skin is warm and dry.     Capillary Refill: Capillary refill takes less than 2 seconds.  Neurological:     Mental Status: She is alert.  Psychiatric:        Mood and Affect: Mood normal.     ED Results / Procedures / Treatments   Labs (all labs ordered are listed, but only abnormal results are displayed) Labs Reviewed  BASIC METABOLIC PANEL - Abnormal; Notable for the following components:      Result Value   Chloride 96 (*)    Glucose, Bld 161 (*)    Creatinine,  Ser 2.42 (*)    GFR, Estimated 22 (*)    All other components within normal limits  CBC - Abnormal; Notable for the following components:   RBC 5.31 (*)    HCT 46.4 (*)    All other components within normal limits  TROPONIN I (HIGH SENSITIVITY)  TROPONIN I (HIGH SENSITIVITY)    EKG EKG Interpretation  Date/Time:  Tuesday May 29 2022 20:40:06 EDT Ventricular Rate:  100 PR Interval:  159 QRS Duration: 87 QT Interval:  360 QTC Calculation: 465 R Axis:   -49 Text Interpretation: Sinus tachycardia Left anterior fascicular block Consider left ventricular hypertrophy Anterior Q waves, possibly due to LVH Confirmed by Madalyn Rob [phone]) on 05/29/2022 9:08:51 PM  Radiology DG Chest 2 View  Result Date: 05/29/2022 CLINICAL DATA:  Chest pain EXAM: CHEST - 2 VIEW COMPARISON:  03/18/2022 FINDINGS: Lateral view degraded by patient arm position. Mild right hemidiaphragm elevation. Right IJ dialysis catheter terminates in right atrium. Midline trachea. Borderline cardiomegaly. No pleural effusion or pneumothorax. Mild perihilar interstitial opacities. IMPRESSION: Borderline cardiomegaly with mild perihilar interstitial opacities, favoring pulmonary edema given dialysis catheter. Electronically Signed   By: Abigail Miyamoto  M.D.   On: 05/29/2022 21:15    Procedures Ultrasound ED Peripheral IV (Provider)  Date/Time: 05/29/2022 9:47 PM  Performed by: Lucrezia Starch, MD Authorized by: Lucrezia Starch, MD   Procedure details:    Indications: hydration     Skin Prep: prepped with other     Location:  Left AC   Angiocath:  20 G   Bedside Ultrasound Guided: Yes     Images: not archived     Patient tolerated procedure without complications: Yes     Dressing applied: Yes       Medications Ordered in ED Medications  labetalol (NORMODYNE) injection 10 mg (has no administration in time range)  fentaNYL (SUBLIMAZE) injection 50 mcg (50 mcg Intravenous Given 05/29/22 2200)  metoCLOPramide (REGLAN) injection 10 mg (10 mg Intravenous Given 05/29/22 2140)  nitroGLYCERIN (NITROSTAT) SL tablet 0.4 mg (0.4 mg Sublingual Given 05/29/22 2145)    ED Course/ Medical Decision Making/ A&P                           Medical Decision Making Amount and/or Complexity of Data Reviewed Labs: ordered. Radiology: ordered.  Risk Prescription drug management.   61 year old lady presents for chest pain. ESRD on HD, DM, neuropathy, dCHF, HTN, HLD, PVD, hx CVA, CAD, and obesity. Recent admission for osteomyelitis s/p b/l BKA.  No obvious ischemic change on EKG.  Difficult IV access, I placed ultrasound IV.  Patient hypertensive, borderline tachycardic.  Provided some nitroglycerin, pain, nausea medicine.  Will check CXR, labs, reassess.  I independently reviewed CXR and interpreted results.  Mild interstitial opacities favoring pulmonary edema per radiology.  Rechecked patient, pain did improve with nitroglycerin.  Still hypertensive.  We will give some additional antihypertensive meds.  Given severity of her initial pain and the fact that pain was resolved with nitro, persistent hypertension, feel patient would benefit from further observation.  I discussed case with cardiology, Dr. Rudi Rummage - he advises if troponin stable and  pain controlled can be discharged or admitted to medicine for observation. Discussed with patient. She is agreeable to admit.         Final Clinical Impression(s) / ED Diagnoses Final diagnoses:  Chest pain, unspecified type  Hypertension, unspecified type    Rx / DC  Orders ED Discharge Orders     None         Lucrezia Starch, MD 05/29/22 2340

## 2022-05-30 ENCOUNTER — Encounter (HOSPITAL_COMMUNITY): Payer: Self-pay | Admitting: Internal Medicine

## 2022-05-30 ENCOUNTER — Observation Stay (HOSPITAL_COMMUNITY): Payer: Medicare Other

## 2022-05-30 DIAGNOSIS — K3184 Gastroparesis: Secondary | ICD-10-CM | POA: Diagnosis present

## 2022-05-30 DIAGNOSIS — I509 Heart failure, unspecified: Secondary | ICD-10-CM | POA: Diagnosis not present

## 2022-05-30 DIAGNOSIS — J810 Acute pulmonary edema: Secondary | ICD-10-CM | POA: Diagnosis present

## 2022-05-30 DIAGNOSIS — Z6841 Body Mass Index (BMI) 40.0 and over, adult: Secondary | ICD-10-CM | POA: Diagnosis not present

## 2022-05-30 DIAGNOSIS — Z89512 Acquired absence of left leg below knee: Secondary | ICD-10-CM | POA: Diagnosis not present

## 2022-05-30 DIAGNOSIS — N186 End stage renal disease: Secondary | ICD-10-CM

## 2022-05-30 DIAGNOSIS — R079 Chest pain, unspecified: Secondary | ICD-10-CM | POA: Diagnosis present

## 2022-05-30 DIAGNOSIS — Z66 Do not resuscitate: Secondary | ICD-10-CM | POA: Diagnosis present

## 2022-05-30 DIAGNOSIS — I2510 Atherosclerotic heart disease of native coronary artery without angina pectoris: Secondary | ICD-10-CM | POA: Diagnosis present

## 2022-05-30 DIAGNOSIS — I132 Hypertensive heart and chronic kidney disease with heart failure and with stage 5 chronic kidney disease, or end stage renal disease: Secondary | ICD-10-CM | POA: Diagnosis present

## 2022-05-30 DIAGNOSIS — H3530 Unspecified macular degeneration: Secondary | ICD-10-CM | POA: Diagnosis present

## 2022-05-30 DIAGNOSIS — E785 Hyperlipidemia, unspecified: Secondary | ICD-10-CM | POA: Diagnosis present

## 2022-05-30 DIAGNOSIS — Z992 Dependence on renal dialysis: Secondary | ICD-10-CM

## 2022-05-30 DIAGNOSIS — E119 Type 2 diabetes mellitus without complications: Secondary | ICD-10-CM

## 2022-05-30 DIAGNOSIS — E1151 Type 2 diabetes mellitus with diabetic peripheral angiopathy without gangrene: Secondary | ICD-10-CM | POA: Diagnosis present

## 2022-05-30 DIAGNOSIS — I6521 Occlusion and stenosis of right carotid artery: Secondary | ICD-10-CM | POA: Diagnosis present

## 2022-05-30 DIAGNOSIS — Z794 Long term (current) use of insulin: Secondary | ICD-10-CM

## 2022-05-30 DIAGNOSIS — I10 Essential (primary) hypertension: Secondary | ICD-10-CM

## 2022-05-30 DIAGNOSIS — E8770 Fluid overload, unspecified: Secondary | ICD-10-CM

## 2022-05-30 DIAGNOSIS — D631 Anemia in chronic kidney disease: Secondary | ICD-10-CM | POA: Diagnosis present

## 2022-05-30 DIAGNOSIS — Z8673 Personal history of transient ischemic attack (TIA), and cerebral infarction without residual deficits: Secondary | ICD-10-CM | POA: Diagnosis not present

## 2022-05-30 DIAGNOSIS — M549 Dorsalgia, unspecified: Secondary | ICD-10-CM | POA: Diagnosis present

## 2022-05-30 DIAGNOSIS — I160 Hypertensive urgency: Secondary | ICD-10-CM | POA: Diagnosis present

## 2022-05-30 DIAGNOSIS — E669 Obesity, unspecified: Secondary | ICD-10-CM | POA: Diagnosis present

## 2022-05-30 DIAGNOSIS — I5033 Acute on chronic diastolic (congestive) heart failure: Secondary | ICD-10-CM | POA: Diagnosis present

## 2022-05-30 DIAGNOSIS — E1149 Type 2 diabetes mellitus with other diabetic neurological complication: Secondary | ICD-10-CM | POA: Diagnosis present

## 2022-05-30 DIAGNOSIS — E1143 Type 2 diabetes mellitus with diabetic autonomic (poly)neuropathy: Secondary | ICD-10-CM | POA: Diagnosis present

## 2022-05-30 DIAGNOSIS — Z89511 Acquired absence of right leg below knee: Secondary | ICD-10-CM | POA: Diagnosis not present

## 2022-05-30 DIAGNOSIS — E1122 Type 2 diabetes mellitus with diabetic chronic kidney disease: Secondary | ICD-10-CM | POA: Diagnosis present

## 2022-05-30 DIAGNOSIS — N2581 Secondary hyperparathyroidism of renal origin: Secondary | ICD-10-CM | POA: Diagnosis present

## 2022-05-30 DIAGNOSIS — E1142 Type 2 diabetes mellitus with diabetic polyneuropathy: Secondary | ICD-10-CM | POA: Diagnosis present

## 2022-05-30 LAB — RENAL FUNCTION PANEL
Albumin: 3.3 g/dL — ABNORMAL LOW (ref 3.5–5.0)
Anion gap: 12 (ref 5–15)
BUN: 11 mg/dL (ref 8–23)
CO2: 28 mmol/L (ref 22–32)
Calcium: 9.3 mg/dL (ref 8.9–10.3)
Chloride: 96 mmol/L — ABNORMAL LOW (ref 98–111)
Creatinine, Ser: 2.58 mg/dL — ABNORMAL HIGH (ref 0.44–1.00)
GFR, Estimated: 20 mL/min — ABNORMAL LOW (ref >60)
Glucose, Bld: 159 mg/dL — ABNORMAL HIGH (ref 70–99)
Phosphorus: 3.4 mg/dL (ref 2.5–4.6)
Potassium: 3.4 mmol/L — ABNORMAL LOW (ref 3.5–5.1)
Sodium: 136 mmol/L (ref 135–145)

## 2022-05-30 LAB — CBG MONITORING, ED
Glucose-Capillary: 155 mg/dL — ABNORMAL HIGH (ref 70–99)
Glucose-Capillary: 165 mg/dL — ABNORMAL HIGH (ref 70–99)
Glucose-Capillary: 149 mg/dL — ABNORMAL HIGH (ref 70–99)

## 2022-05-30 LAB — LIPASE, BLOOD: Lipase: 20 U/L (ref 11–51)

## 2022-05-30 LAB — HEPATIC FUNCTION PANEL
ALT: 35 U/L (ref 0–44)
AST: 37 U/L (ref 15–41)
Albumin: 3.4 g/dL — ABNORMAL LOW (ref 3.5–5.0)
Alkaline Phosphatase: 99 U/L (ref 38–126)
Bilirubin, Direct: 0.3 mg/dL — ABNORMAL HIGH (ref 0.0–0.2)
Indirect Bilirubin: 0.9 mg/dL (ref 0.3–0.9)
Total Bilirubin: 1.2 mg/dL (ref 0.3–1.2)
Total Protein: 7.3 g/dL (ref 6.5–8.1)

## 2022-05-30 LAB — TROPONIN I (HIGH SENSITIVITY)
Troponin I (High Sensitivity): 18 ng/L — ABNORMAL HIGH (ref <18)
Troponin I (High Sensitivity): 20 ng/L — ABNORMAL HIGH (ref <18)

## 2022-05-30 LAB — BRAIN NATRIURETIC PEPTIDE: B Natriuretic Peptide: 654.3 pg/mL — ABNORMAL HIGH (ref 0.0–100.0)

## 2022-05-30 LAB — GLUCOSE, CAPILLARY
Glucose-Capillary: 141 mg/dL — ABNORMAL HIGH (ref 70–99)
Glucose-Capillary: 147 mg/dL — ABNORMAL HIGH (ref 70–99)
Glucose-Capillary: 147 mg/dL — ABNORMAL HIGH (ref 70–99)

## 2022-05-30 LAB — HIV ANTIBODY (ROUTINE TESTING W REFLEX): HIV Screen 4th Generation wRfx: NONREACTIVE

## 2022-05-30 MED ORDER — CALCIUM ACETATE (PHOS BINDER) 667 MG PO CAPS
667.0000 mg | ORAL_CAPSULE | Freq: Three times a day (TID) | ORAL | Status: DC
  Administered 2022-05-30 – 2022-06-04 (×12): 667 mg via ORAL
  Filled 2022-05-30 (×13): qty 1

## 2022-05-30 MED ORDER — PROCHLORPERAZINE EDISYLATE 10 MG/2ML IJ SOLN
5.0000 mg | Freq: Four times a day (QID) | INTRAMUSCULAR | Status: DC | PRN
Start: 2022-05-30 — End: 2022-06-04
  Administered 2022-05-30 – 2022-06-02 (×3): 5 mg via INTRAVENOUS
  Filled 2022-05-30 (×3): qty 2

## 2022-05-30 MED ORDER — INSULIN ASPART 100 UNIT/ML IJ SOLN: 0.0000 [IU] | Freq: Three times a day (TID) | INTRAMUSCULAR | Status: DC

## 2022-05-30 MED ORDER — HEPARIN SODIUM (PORCINE) 5000 UNIT/ML IJ SOLN: 5000.0000 [IU] | Freq: Three times a day (TID) | INTRAMUSCULAR | Status: DC

## 2022-05-30 MED ORDER — INSULIN ASPART 100 UNIT/ML IJ SOLN: 0.0000 [IU] | Freq: Every day | INTRAMUSCULAR | Status: DC

## 2022-05-30 MED ORDER — INSULIN ASPART 100 UNIT/ML IJ SOLN
0.0000 [IU] | INTRAMUSCULAR | Status: DC
  Administered 2022-05-30 (×2): 1 [IU] via SUBCUTANEOUS

## 2022-05-30 MED ORDER — EZETIMIBE 10 MG PO TABS
10.0000 mg | ORAL_TABLET | Freq: Every day | ORAL | Status: DC
  Administered 2022-05-30 – 2022-06-04 (×6): 10 mg via ORAL
  Filled 2022-05-30 (×6): qty 1

## 2022-05-30 MED ORDER — ONDANSETRON HCL 4 MG/2ML IJ SOLN
4.0000 mg | Freq: Four times a day (QID) | INTRAMUSCULAR | Status: DC | PRN
Start: 2022-05-30 — End: 2022-06-04
  Administered 2022-05-31 – 2022-06-02 (×5): 4 mg via INTRAVENOUS
  Filled 2022-05-30 (×5): qty 2

## 2022-05-30 MED ORDER — INSULIN GLARGINE-YFGN 100 UNIT/ML ~~LOC~~ SOLN
8.0000 [IU] | Freq: Every day | SUBCUTANEOUS | Status: DC
  Administered 2022-05-30 – 2022-06-03 (×5): 8 [IU] via SUBCUTANEOUS
  Filled 2022-05-30 (×7): qty 0.08

## 2022-05-30 MED ORDER — ATORVASTATIN CALCIUM 40 MG PO TABS
40.0000 mg | ORAL_TABLET | Freq: Every day | ORAL | Status: DC
  Administered 2022-05-30 – 2022-06-04 (×6): 40 mg via ORAL
  Filled 2022-05-30 (×6): qty 1

## 2022-05-30 MED ORDER — LABETALOL HCL 5 MG/ML IV SOLN: 10.0000 mg | INTRAVENOUS | Status: DC | PRN

## 2022-05-30 MED ORDER — AMLODIPINE BESYLATE 5 MG PO TABS: 5.0000 mg | ORAL_TABLET | Freq: Every day | ORAL | Status: DC

## 2022-05-30 MED ORDER — CHLORHEXIDINE GLUCONATE CLOTH 2 % EX PADS
6.0000 | MEDICATED_PAD | Freq: Every day | CUTANEOUS | Status: DC
  Administered 2022-05-30 – 2022-06-03 (×4): 6 via TOPICAL

## 2022-05-30 MED ORDER — HYDROMORPHONE HCL 2 MG PO TABS
2.0000 mg | ORAL_TABLET | ORAL | Status: DC | PRN
  Administered 2022-05-30 – 2022-06-01 (×6): 2 mg via ORAL
  Administered 2022-06-02 – 2022-06-03 (×2): 3 mg via ORAL
  Administered 2022-06-03: 2 mg via ORAL
  Administered 2022-06-03 – 2022-06-04 (×2): 3 mg via ORAL
  Filled 2022-05-30 (×2): qty 2
  Filled 2022-05-30: qty 1
  Filled 2022-05-30: qty 2
  Filled 2022-05-30 (×2): qty 1
  Filled 2022-05-30: qty 2
  Filled 2022-05-30 (×4): qty 1

## 2022-05-30 MED ORDER — LABETALOL HCL 5 MG/ML IV SOLN
10.0000 mg | INTRAVENOUS | Status: DC | PRN
Start: 2022-05-30 — End: 2022-06-04
  Administered 2022-05-30 – 2022-05-31 (×6): 10 mg via INTRAVENOUS
  Filled 2022-05-30 (×6): qty 4

## 2022-05-30 MED ORDER — PANTOPRAZOLE SODIUM 40 MG IV SOLR
40.0000 mg | INTRAVENOUS | Status: DC
  Administered 2022-05-30 – 2022-05-31 (×2): 40 mg via INTRAVENOUS
  Filled 2022-05-30 (×2): qty 10

## 2022-05-30 MED ORDER — ASPIRIN 81 MG PO TBEC: 81.0000 mg | DELAYED_RELEASE_TABLET | Freq: Every day | ORAL | Status: DC

## 2022-05-30 MED ORDER — AMLODIPINE BESYLATE 10 MG PO TABS
10.0000 mg | ORAL_TABLET | Freq: Every day | ORAL | Status: DC
  Administered 2022-05-30 – 2022-06-04 (×6): 10 mg via ORAL
  Filled 2022-05-30 (×5): qty 1
  Filled 2022-05-30: qty 2

## 2022-05-30 MED ORDER — INSULIN GLARGINE-YFGN 100 UNIT/ML ~~LOC~~ SOLN: 15.0000 [IU] | Freq: Every day | SUBCUTANEOUS | Status: DC

## 2022-05-30 MED ORDER — PANTOPRAZOLE SODIUM 40 MG PO TBEC: 40.0000 mg | DELAYED_RELEASE_TABLET | Freq: Every day | ORAL | Status: DC

## 2022-05-30 MED ORDER — PROMETHAZINE HCL 25 MG PO TABS
25.0000 mg | ORAL_TABLET | Freq: Three times a day (TID) | ORAL | Status: DC | PRN
  Administered 2022-05-30: 50 mg via ORAL
  Filled 2022-05-30: qty 2

## 2022-05-30 MED ORDER — POLYETHYLENE GLYCOL 3350 17 G PO PACK
17.0000 g | PACK | Freq: Two times a day (BID) | ORAL | Status: AC
  Administered 2022-05-30 – 2022-05-31 (×2): 17 g via ORAL
  Filled 2022-05-30 (×4): qty 1

## 2022-05-30 MED ORDER — NITROGLYCERIN 0.4 MG SL SUBL
0.4000 mg | SUBLINGUAL_TABLET | Freq: Once | SUBLINGUAL | Status: AC | PRN
  Administered 2022-05-30: 0.4 mg via SUBLINGUAL

## 2022-05-30 MED ORDER — ASPIRIN 81 MG PO TBEC
81.0000 mg | DELAYED_RELEASE_TABLET | Freq: Every day | ORAL | Status: DC
  Administered 2022-05-30 – 2022-06-04 (×6): 81 mg via ORAL
  Filled 2022-05-30 (×6): qty 1

## 2022-05-30 MED ORDER — ACETAMINOPHEN 650 MG RE SUPP: 650.0000 mg | Freq: Four times a day (QID) | RECTAL | Status: DC | PRN

## 2022-05-30 MED ORDER — ACETAMINOPHEN 325 MG PO TABS
650.0000 mg | ORAL_TABLET | Freq: Four times a day (QID) | ORAL | Status: DC | PRN
  Administered 2022-05-30: 650 mg via ORAL
  Filled 2022-05-30: qty 2

## 2022-05-30 NOTE — H&P (Signed)
History and Physical    Nancy [name] GBT:[id number] DOB: July 01, 1961 DOA: 05/29/2022  PCP: [name], MD  Patient coming from:  [facility]  Chief Complaint: Chest pain  HPI: Nancy [name] is a 61 y.o. 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  Slightly tachycardic on arrival to the ED and hypertensive with blood pressure 186/100.  Afebrile.  Labs showing WBC 9.2, hemoglobin 14.3, platelet count 249k.  Sodium 138, potassium 4.0, chloride 96, bicarb 30, BUN 11, creatinine 2.4, glucose 161.  High-sensitivity troponin 17 and EKG without acute ischemic changes.  Lipase and LFTs normal.  Chest x-ray showing pulmonary edema.  Patient was not hypoxic, maintaining sats in the upper 90s on room air. Patient was given fentanyl, IV labetalol 10 mg, Reglan, sublingual nitroglycerin, and Compazine.  ED physician discussed the case with Dr. Rudi Rummage with cardiology who felt that if the patient's troponin remained stable and pain was controlled, she could be discharged or admitted to medicine for observation.  Repeat troponin 18.  Patient states she was vomiting all day yesterday.  Tonight while resting she experienced sudden onset substernal pressure-like nonradiating chest pain which has now resolved.  Associated with diaphoresis but no dyspnea.  Denies fevers, diarrhea, or abdominal pain.  Reports history of gastroparesis.  Denies any sick contacts.  Patient states she has not missed dialysis and takes her blood pressure medications regularly.  Review of Systems:  Review of Systems  All other systems reviewed and are negative.   Past Medical History:  Diagnosis  Date   Anemia 2006   Carotid artery disease ([place])    right ICA occlusion, 71-06% LICA 01/6947 [place]   CHF (congestive heart failure) ([place])    Chronic kidney disease    Coronary artery disease    Depression 2014   previously on amitryptiline    Diabetes mellitus with neurological manifestation ([place]) 2006   Diabetic peripheral neuropathy ([place]) 04/26/2020   Dyspnea    Fracture of left ankle 1997   Gastroparesis 07/2016   Heart murmur    HOH (hard of hearing) 2004   Hyperlipidemia 2006   Hypertension 2006   IBS (irritable bowel syndrome) 2002   Leukopenia 2015   Macular degeneration 11/2019   Shingles 2009   Stroke [facility]) 2020   Thyroid nodule 2004    Past Surgical History:  Procedure Laterality Date   ABDOMINAL HYSTERECTOMY  2005   AMPUTATION Bilateral 04/04/2022   Procedure: BILATERAL BELOW KNEE AMPUTATION;  Surgeon: Newt Minion, MD;  Location: [place];  Service: Orthopedics;  Laterality: Bilateral;   AV FISTULA PLACEMENT Right 12/22/2021   Procedure: RIGHT ARM ARTERIOVENOUS (AV) BRACIOCPHELAIC FISTULA CREATION;  Surgeon: Waynetta Sandy, MD;  Location: [place];  Service: Vascular;  Laterality: Right;   AV FISTULA PLACEMENT Right 02/19/2022   Procedure: RIGHT ARTERIOVENOUS FISTULA;  Surgeon: Angelia Mould, MD;  Location: [place];  Service: Vascular;  Laterality: Right;   CATARACT EXTRACTION Left 11/2019   CESAREAN SECTION  1983    CHOLECYSTECTOMY N/A 03/05/2020   Procedure: LAPAROSCOPIC CHOLECYSTECTOMY WITH INTRAOPERATIVE CHOLANGIOGRAM;  Surgeon: Donnie Mesa, MD;  Location: [facility];  Service: General;  Laterality: N/A;   ESOPHAGOGASTRODUODENOSCOPY (EGD) WITH PROPOFOL Left 08/26/2014  Procedure: ESOPHAGOGASTRODUODENOSCOPY (EGD) WITH PROPOFOL;  Surgeon: Arta Silence, MD;  Location: [facility];  Service: Endoscopy;  Laterality: Left;   ESOPHAGOGASTRODUODENOSCOPY (EGD) WITH PROPOFOL N/A 03/03/2020   Procedure: ESOPHAGOGASTRODUODENOSCOPY (EGD) WITH PROPOFOL;  Surgeon:  Jerene Bears, MD;  Location: [facility];  Service: Gastroenterology;  Laterality: N/A;   IR FLUORO GUIDE CV LINE RIGHT  02/15/2022   IR US GUIDE VASC ACCESS RIGHT  02/15/2022   RIGHT HEART CATH N/A 07/14/2021   Procedure: RIGHT HEART CATH;  Surgeon: Larey Dresser, MD;  Location: [place] [facility];  Service: Cardiovascular;  Laterality: N/A;   RIGHT/LEFT HEART CATH AND CORONARY ANGIOGRAPHY N/A 04/27/2021   Procedure: RIGHT/LEFT HEART CATH AND CORONARY ANGIOGRAPHY;  Surgeon: [name], MD;  Location: [place] [facility];  Service: Cardiovascular;  Laterality: N/A;     reports that she has quit smoking. Her smoking use included cigarettes. She has a 0.13 pack-year smoking history. She has never used smokeless tobacco. She reports that she does not drink alcohol and does not use drugs.  Allergies  Allergen Reactions   Hydralazine Hcl Other (See Comments)    Hair loss   Hydrocodone Itching and Other (See Comments)    Upset stomach   Metformin And Related Nausea And Vomiting and Other (See Comments)    Stomach pains, also   Other Nausea Only and Other (See Comments)    Lettuce- Does not digest this!!   Plaquenil [Hydroxychloroquine Sulfate] Hives   Shellfish-Derived Products Nausea Only and Other (See Comments)    Caused an upset stomach   Shrimp (Diagnostic) Nausea Only and Other (See Comments)    Upset stomach    Sulfa Antibiotics Hives    Family History  Problem Relation Age of Onset   Hypertension Mother    Heart disease Mother    Diabetes Mother    Thyroid disease Mother    Congestive Heart Failure Mother    Breast cancer Maternal Grandmother    Colon cancer Maternal Grandfather    Heart attack Sister    Heart disease Brother    Hyperlipidemia Brother    Hypertension Brother    Diabetes Father    Breast cancer Maternal Aunt     Prior to Admission medications   Medication Sig Start Date End Date Taking? Authorizing Provider  amLODipine (NORVASC) 5 MG tablet Take  1 tablet (5 mg total) by mouth daily. 04/11/22  Yes Danford, Suann Larry, MD  aspirin 81 MG EC tablet Take 1 tablet (81 mg total) by mouth daily. 09/26/21  Yes Milford, Maricela Bo, FNP  atorvastatin (LIPITOR) 40 MG tablet Take 1 tablet (40 mg total) by mouth daily. 11/07/21  Yes Larey Dresser, MD  calcium acetate (PHOSLO) 667 MG capsule TAKE 1 CAPSULE (667 MG TOTAL) BY MOUTH 3 (THREE) TIMES DAILY WITH MEALS. 02/08/22  Yes [name], MD  Continuous Blood Gluc Receiver (DEXCOM G6 RECEIVER) DEVI 1 Device by Does not apply route daily. 11/15/21  Yes [name], MD  Continuous Blood Gluc Sensor (DEXCOM G6 SENSOR) MISC 1 packet by Does not apply route daily. 11/15/21  Yes [name], MD  Continuous Blood Gluc Transmit (DEXCOM G6 TRANSMITTER) MISC 1 packet by Does not apply route daily. 11/15/21  Yes [name], MD  ezetimibe (ZETIA) 10 MG tablet Take 1 tablet (10 mg total) by mouth daily. 04/11/22  Yes Danford, Suann Larry, MD  ferrous sulfate 325 (65 FE) MG tablet Take 1 tablet (325 mg total) by  mouth daily with breakfast. 11/07/21  Yes [name], MD  HYDROmorphone (DILAUDID) 2 MG tablet Take 1-1.5 tablets (2-3 mg total) by mouth every 4 (four) hours as needed for severe pain (pain score 7-10). 04/10/22  Yes Danford, Suann Larry, MD  insulin glargine (LANTUS) 100 UNIT/ML Solostar Pen Inject 10 Units into the skin at bedtime. Patient taking differently: Inject 15 Units into the skin at bedtime. 04/10/22  Yes Danford, Suann Larry, MD  insulin lispro (HUMALOG KWIKPEN) 100 UNIT/ML KwikPen Inject 3 units before breakfast and dinner. Patient taking differently: Inject 4 Units into the skin 2 (two) times daily with a meal. 04/10/22  Yes Danford, Suann Larry, MD  Insulin Pen Needle 32G X 4 MM MISC Use to inject insulin as directed. 11/07/21  Yes [name], MD  multivitamin (RENA-VIT) TABS tablet Take 1 tablet by mouth at bedtime. 04/10/22  Yes Danford,  Suann Larry, MD  ondansetron (ZOFRAN) 8 MG tablet Take 1 tablet (8 mg total) by mouth every 8 (eight) hours as needed for nausea 05/23/22  Yes   pantoprazole (PROTONIX) 40 MG tablet Take 1 tablet (40 mg total) by mouth daily. 04/11/22  Yes Danford, Suann Larry, MD  promethazine (PHENERGAN) 25 MG tablet Take 25-50 mg by mouth every 8 (eight) hours as needed for vomiting or nausea. 04/23/22  Yes [provider]  zinc sulfate 220 (50 Zn) MG capsule Take 1 capsule (220 mg total) by mouth daily. 04/11/22  Yes Danford, Suann Larry, MD  ascorbic acid (VITAMIN C) 1000 MG tablet Take 1 tablet (1,000 mg total) by mouth daily. Patient not taking: Reported on 05/29/2022 04/11/22   Edwin Dada, MD  Blood Glucose Monitoring Suppl (ONETOUCH VERIO) w/Device KIT Check blood sugar three times daily. E11.40 Patient not taking: Reported on 05/29/2022 05/06/20   [name], MD  glucose blood test strip Use as instructedCheck blood sugar three times daily. E11.40 Patient not taking: Reported on 05/29/2022 07/11/20   [name], MD  Insulin NPH Isophane & Regular (RELION 70/30 Pierce) Inject 35 Units into the skin 2 (two) times daily.  08/17/14  [provider]    Physical Exam: Vitals:   05/29/22 2345 05/30/22 0015 05/30/22 0030 05/30/22 0100  BP: (!) 185/73 (!) 181/78 (!) 192/71 (!) 193/80  Pulse: 99 93 96 95  Resp: $Remo'19 16 14 15  'UlMWw$ Temp:      TempSrc:      SpO2: 98% 100% 100% 97%    Physical Exam Vitals reviewed.  Constitutional:      General: She is not in acute distress. HENT:     Head: Normocephalic and atraumatic.  Eyes:     Extraocular Movements: Extraocular movements intact.  Cardiovascular:     Rate and Rhythm: Normal rate and regular rhythm.     Pulses: Normal pulses.  Pulmonary:     Effort: Pulmonary effort is normal. No respiratory distress.     Breath sounds: Rales present. No wheezing.  Abdominal:     General: Bowel sounds are normal.     Palpations:  Abdomen is soft.     Tenderness: There is abdominal tenderness. There is guarding.     Comments: Generalized tenderness to palpation, moaning when her abdomen is palpated  Musculoskeletal:     Cervical back: Normal range of motion.     Comments: Bilateral BKA  Skin:    General: Skin is warm and dry.  Neurological:     General: No focal deficit present.  Mental Status: She is alert and oriented to person, place, and time.      Labs on Admission: I have personally reviewed following labs and imaging studies  CBC: Recent Labs  Lab 05/29/22 2033  WBC 9.2  HGB 14.3  HCT 46.4*  MCV 87.4  PLT 202   Basic Metabolic Panel: Recent Labs  Lab 05/29/22 2033  NA 138  K 4.0  CL 96*  CO2 30  GLUCOSE 161*  BUN 11  CREATININE 2.42*  CALCIUM 9.8   GFR: CrCl cannot be calculated (Unknown ideal weight.). Liver Function Tests: Recent Labs  Lab 05/29/22 2233  AST 37  ALT 35  ALKPHOS 99  BILITOT 1.2  PROT 7.3  ALBUMIN 3.4*   Recent Labs  Lab 05/29/22 2233  LIPASE 20   No results for input(s): "AMMONIA" in the last 168 hours. Coagulation Profile: No results for input(s): "INR", "PROTIME" in the last 168 hours. Cardiac Enzymes: No results for input(s): "CKTOTAL", "CKMB", "CKMBINDEX", "TROPONINI" in the last 168 hours. BNP (last 3 results) No results for input(s): "PROBNP" in the last 8760 hours. HbA1C: No results for input(s): "HGBA1C" in the last 72 hours. CBG: No results for input(s): "GLUCAP" in the last 168 hours. Lipid Profile: No results for input(s): "CHOL", "HDL", "LDLCALC", "TRIG", "CHOLHDL", "LDLDIRECT" in the last 72 hours. Thyroid Function Tests: No results for input(s): "TSH", "T4TOTAL", "FREET4", "T3FREE", "THYROIDAB" in the last 72 hours. Anemia Panel: No results for input(s): "VITAMINB12", "FOLATE", "FERRITIN", "TIBC", "IRON", "RETICCTPCT" in the last 72 hours. Urine analysis:    Component Value Date/Time   COLORURINE YELLOW 10/06/2021 0138    APPEARANCEUR CLEAR 10/06/2021 0138   LABSPEC 1.009 10/06/2021 0138   PHURINE 5.0 10/06/2021 0138   GLUCOSEU 50 (A) 10/06/2021 0138   HGBUR MODERATE (A) 10/06/2021 0138   BILIRUBINUR NEGATIVE 10/06/2021 0138   BILIRUBINUR negative 12/26/2017 1458   KETONESUR NEGATIVE 10/06/2021 0138   PROTEINUR >=300 (A) 10/06/2021 0138   UROBILINOGEN 0.2 12/26/2017 1458   UROBILINOGEN 0.2 09/07/2014 0857   NITRITE NEGATIVE 10/06/2021 0138   LEUKOCYTESUR NEGATIVE 10/06/2021 0138    Radiological Exams on Admission: I have personally reviewed images DG Chest 2 View  Result Date: 05/29/2022 CLINICAL DATA:  Chest pain EXAM: CHEST - 2 VIEW COMPARISON:  03/18/2022 FINDINGS: Lateral view degraded by patient arm position. Mild right hemidiaphragm elevation. Right IJ dialysis catheter terminates in right atrium. Midline trachea. Borderline cardiomegaly. No pleural effusion or pneumothorax. Mild perihilar interstitial opacities. IMPRESSION: Borderline cardiomegaly with mild perihilar interstitial opacities, favoring pulmonary edema given dialysis catheter. Electronically Signed   By: Abigail Miyamoto M.D.   On: 05/29/2022 21:15    EKG: Independently reviewed.  Sinus tachycardia, LAFB.  No acute ischemic changes.  Assessment and Plan  Volume overload ESRD on HD Acute on chronic HFpEF Echo done October 2022 showing LVEF 50 to 54%, grade 2 diastolic dysfunction. Chest x-ray showing pulmonary edema.  Not hypoxic. -Please consult nephrology in the morning for dialysis. -Dietary fluid restriction -Check BNP -Strict intake and output -Daily weights  Hypertensive urgency Blood pressure continues to be elevated with systolic in the 270W. -IV labetalol prn -Continue amlodipine when no longer n.p.o. and able to tolerate p.o. intake. -Consult nephrology in the morning for dialysis.  Chest pain Likely due to volume overload/hypertensive urgency.  Troponin stable and not consistent with ACS.  PE less likely as she is not  hypoxic.  No active chest pain at this time. -Cardiac monitoring  Nausea and vomiting Abdominal pain  Lipase and LFTs normal.  Although symptoms could be due to gastroparesis, patient is moaning and very uncomfortable when her abdomen is palpated. -Keep n.p.o. at this time -Stat CT abdomen pelvis -Antiemetic as needed -Keep n.p.o. at this time  Insulin-dependent type 2 diabetes A1c 6.3 on 02/16/2022. -Continue home basal insulin at approximately 1/2 of usual dose given poor oral intake (Semglee 8 units at bedtime) -Very sensitive sliding scale insulin every 4 hours  CAD Work-up not suggestive of ACS. -Continue Lipitor and Zetia when no longer n.p.o. and able to tolerate p.o. intake. -Hold aspirin until CT abdomen pelvis is done.  PVD -Continue Lipitor and Zetia when no longer n.p.o. and able to tolerate p.o. intake. -Hold aspirin until CT abdomen pelvis is done.  History of CVA -Continue Lipitor and Zetia when no longer n.p.o. and able to tolerate p.o. intake. -Hold aspirin until CT abdomen pelvis is done.  GERD -IV Protonix 40 mg daily  DVT prophylaxis: Avoid anticoagulation for DVT prophylaxis until CT abdomen pelvis is done.  SCDs cannot be placed due to bilateral BKA. Code Status: Patient wishes to be DNR. Family Communication: No family available at this time. Level of care: Telemetry bed Admission status: It is my clinical opinion that referral for OBSERVATION is reasonable and necessary in this patient based on the above information provided. The aforementioned taken together are felt to place the patient at high risk for further clinical deterioration. However, it is anticipated that the patient may be medically stable for discharge from the hospital within 24 to 48 hours.   Shela Leff MD [facility]  If 7PM-7AM, please contact night-coverage [email or web address]  05/30/2022, 1:17 AM

## 2022-05-30 NOTE — ED Provider Notes (Signed)
I called report to Dr. Marlowe Sax. She requested to check LFTs and lipase.  Those labs are unremarkable.  Patient now resting comfortably.  Awaiting admission   Ripley Fraise, MD 05/30/22 9412

## 2022-05-30 NOTE — Progress Notes (Signed)
[facility] PROGRESS NOTE    Progress Note  Nancy [name]  YPP:[id number] DOB: March 30, 1961 DOA: 05/29/2022 PCP: [name], MD     Brief Narrative:   Nancy [name] is an 61 y.o. female past medical history significant for end-stage renal disease on hemodialysis Monday Wednesday and Friday, insulin-dependent diabetes mellitus type 2, chronic diastolic heart failure, essential hypertension, CVA, discharge in April 2023 for gangrenous foot ulcer bilateral with cellulitis and osteomyelitis, status post BKA bilaterally comes into the ED with acute onset of chest pain nausea and vomiting with a blood pressure of 186/100.  White count of 19 hemoglobin of 14 platelet count of 300 twelve-lead EKG showed no signs of ischemia chest x-ray showed pulmonary edema satting 90% on room air was started on nitro Compazine and IV labetalol.   Assessment/Plan:   Volume overload/  ESRD on dialysis (HCC)/acute pulmonary edema: 2D echo in 2022 showed an EF of 45% grade 2 diastolic heart failure. Chest x-ray showed pulmonary edema. Nephrology has been consulted for HD. Continue daily weights for possible dialysis today. She usually dialyzes Monday Wednesday and Friday. Allow with diet  Hypertensive urgency: Started on IV labetalol,  Nausea & vomiting have resolved. Increase amlodipine  Insulin dependent type 2 diabetes mellitus (HCC) Blood glucose relatively controlled continue sliding scale insulin.  Nausea vomiting and abdominal pain chronic LFTs unremarkable she relates her nausea vomiting has resolved.  Question due to volume overload. CT scan of the abdomen and pelvis showed no acute findings. She relates she is hungry with diet. Antiemetics as needed.  CAD: Twelve-lead EKG showed no signs of ischemia cardiac biomarkers have basically remained flat. Continue Lipitor and Zetia and aspirin.  Peripheral vascular disease: Continue current regimen.  History of CVA: At  resume aspirin Lipitor and Zetia.   Unstageable left heel ulcer present on admission: RN Pressure Injury Documentation: Pressure Injury 02/15/22 Heel Left Unstageable - Full thickness tissue loss in which the base of the injury is covered by slough (yellow, tan, gray, green or brown) and/or eschar (tan, brown or black) in the wound bed. (Active)  02/15/22 1856  Location: Heel  Location Orientation: Left  Staging: Unstageable - Full thickness tissue loss in which the base of the injury is covered by slough (yellow, tan, gray, green or brown) and/or eschar (tan, brown or black) in the wound bed.  Wound Description (Comments):   Present on Admission: Yes    Estimated body mass index is 43.08 kg/m as calculated from the following:   Height as of 03/29/22: '5\' 3"'$  (1.6 m).   Weight as of 04/10/22: 110.3 kg.     DVT prophylaxis: lovenox Family Communication:none Status is: Observation The patient remains OBS appropriate and will d/c before 2 midnights.    Code Status:     Code Status Orders  (From admission, onward)           Start     Ordered   05/30/22 0233  Do not attempt resuscitation (DNR)  Continuous       Question Answer Comment  In the event of cardiac or respiratory ARREST Do not call a "code blue"   In the event of cardiac or respiratory ARREST Do not perform Intubation, CPR, defibrillation or ACLS   In the event of cardiac or respiratory ARREST Use medication by any route, position, wound care, and other measures to relive pain and suffering. May use oxygen, suction and manual treatment of airway obstruction as needed for comfort.  05/30/22 0232           Code Status History     Date Active Date Inactive Code Status Order ID Comments User Context   05/30/2022 0205 05/30/2022 0232 Full Code [id number]  Shela Leff, MD ED   03/29/2022 2207 04/10/2022 2305 DNR [id number]  Shela Leff, MD ED   03/29/2022 2148 03/29/2022 2207 Full Code [id number]   Shela Leff, MD ED   02/14/2022 1748 02/21/2022 1742 Full Code [id number]  Darliss Cheney, MD ED   10/24/2021 2107 11/01/2021 2305 DNR [id number]  Rehman, [place], DO ED   10/06/2021 0419 10/10/2021 1731 Full Code [id number]  Vernelle Emerald, MD ED   09/11/2021 1131 09/21/2021 1816 DNR [id number]  Norberta Keens, PA-C Inpatient   09/04/2021 1613 09/11/2021 1131 Full Code [id number]  Rick Duff, MD ED   07/07/2021 1936 07/23/2021 2303 Full Code [id number]  Tommie Raymond, NP Inpatient   06/10/2021 1708 06/15/2021 1635 Full Code [id number]  Jonnie Finner, DO Inpatient   05/09/2021 [place] 05/12/2021 1720 Full Code [id number]  Harold Hedge, MD ED   04/27/2021 0847 04/27/2021 1712 Full Code [id number]  [name], MD Inpatient   04/20/2020 2315 04/22/2020 1629 Full Code [id number]  Eugenie Filler, MD ED   02/29/2020 1813 03/08/2020 0317 Full Code [id number]  Kayleen Memos, DO ED   07/23/2016 0816 07/26/2016 2124 Full Code [id number]  Rondel Jumbo, PA-C Inpatient   09/07/2014 1800 09/14/2014 1757 Full Code [id number]  Modena Jansky, MD Inpatient   08/23/2014 0236 08/26/2014 2109 Full Code [id number]  Bonnielee Haff, MD Inpatient   08/04/2014 1347 08/08/2014 1810 Full Code [id number]  Rama, Venetia Maxon, MD Inpatient         IV Access:   Peripheral IV   Procedures and diagnostic studies:   CT ABDOMEN PELVIS WO CONTRAST  Result Date: 05/30/2022 CLINICAL DATA:  Abdominal pain. EXAM: CT ABDOMEN AND PELVIS WITHOUT CONTRAST TECHNIQUE: Multidetector CT imaging of the abdomen and pelvis was performed following the standard protocol without IV contrast. RADIATION DOSE REDUCTION: This exam was performed according to the departmental dose-optimization program which includes automated exposure control, adjustment of the mA and/or kV according to patient size and/or use of iterative reconstruction technique. COMPARISON:  CT abdomen pelvis dated 02/29/2020. FINDINGS: Evaluation of this exam is limited in the  absence of intravenous contrast. Lower chest: There are bibasilar linear and streaky atelectasis and scarring. Slight subpleural nodularity of the posterior lung bases likely related to trace pleural effusions. The tip of the dialysis catheter noted in the right atrium. No intra-abdominal free air or free fluid. Hepatobiliary: The liver is unremarkable. No intrahepatic biliary dilatation. Cholecystectomy. No retained calcified stone noted in the central CBD. Pancreas: There is slight prominence of the uncinate process of the pancreas with mild atrophy of the body of the pancreas. Overall decrease in the size of the tissue in the head and uncinate process of the pancreas compared to prior CT. No significant inflammatory changes. No fluid collection. Spleen: Normal in size without focal abnormality. Adrenals/Urinary Tract: The adrenal glands unremarkable. The kidneys, visualized ureters, and urinary bladder appear unremarkable. Stomach/Bowel: There is moderate amount of stool throughout the colon. There is no bowel obstruction or active inflammation. The appendix is normal. Vascular/Lymphatic: The abdominal aorta and IVC are unremarkable. No portal venous gas. There is no adenopathy. Reproductive: Hysterectomy.  No adnexal masses. Other: There is diffuse subcutaneous edema. Musculoskeletal: No acute or significant osseous findings. IMPRESSION: 1.  No acute intra-abdominal or pelvic pathology. 2. Constipation.  No bowel obstruction. Normal appendix. Electronically Signed   By: Anner Crete M.D.   On: 05/30/2022 03:39   DG Chest 2 View  Result Date: 05/29/2022 CLINICAL DATA:  Chest pain EXAM: CHEST - 2 VIEW COMPARISON:  03/18/2022 FINDINGS: Lateral view degraded by patient arm position. Mild right hemidiaphragm elevation. Right IJ dialysis catheter terminates in right atrium. Midline trachea. Borderline cardiomegaly. No pleural effusion or pneumothorax. Mild perihilar interstitial opacities. IMPRESSION:  Borderline cardiomegaly with mild perihilar interstitial opacities, favoring pulmonary edema given dialysis catheter. Electronically Signed   By: Abigail Miyamoto M.D.   On: 05/29/2022 21:15     Medical Consultants:   None.   Subjective:    Nancy [name] her nausea and vomiting has resolved she would like to try diet.  Objective:    Vitals:   05/30/22 0600 05/30/22 0630 05/30/22 0651 05/30/22 0800  BP: (!) 184/77 (!) 161/93  (!) 199/83  Pulse: 96 95 96 96  Resp: 20 (!) '28 12 12  '$ Temp:      TempSrc:      SpO2: 99% 100% 100% 100%   SpO2: 100 %  No intake or output data in the 24 hours ending 05/30/22 0926 There were no vitals filed for this visit.  Exam: General exam: In no acute distress. Respiratory system: Good air movement and clear to auscultation. Cardiovascular system: S1 & S2 heard, RRR. No JVD. Gastrointestinal system: Abdomen is nondistended, soft and nontender.  Extremities: No edema and her thighs she has below the knee bilateral amputation Skin: No rashes, lesions or ulcers Psychiatry: Judgement and insight appear normal. Mood & affect appropriate.    Data Reviewed:    Labs: Basic Metabolic Panel: Recent Labs  Lab 05/29/22 2033 05/30/22 0358  NA 138 136  K 4.0 3.4*  CL 96* 96*  CO2 30 28  GLUCOSE 161* 159*  BUN 11 11  CREATININE 2.42* 2.58*  CALCIUM 9.8 9.3  PHOS  --  3.4   GFR CrCl cannot be calculated (Unknown ideal weight.). Liver Function Tests: Recent Labs  Lab 05/29/22 2233 05/30/22 0358  AST 37  --   ALT 35  --   ALKPHOS 99  --   BILITOT 1.2  --   PROT 7.3  --   ALBUMIN 3.4* 3.3*   Recent Labs  Lab 05/29/22 2233  LIPASE 20   No results for input(s): "AMMONIA" in the last 168 hours. Coagulation profile No results for input(s): "INR", "PROTIME" in the last 168 hours. COVID-19 Labs  No results for input(s): "DDIMER", "FERRITIN", "LDH", "CRP" in the last 72 hours.  Lab Results  Component Value Date   SARSCOV2NAA  NEGATIVE 03/30/2022   SARSCOV2NAA NEGATIVE 02/14/2022   SARSCOV2NAA NEGATIVE 10/24/2021   [place] NEGATIVE 10/05/2021    CBC: Recent Labs  Lab 05/29/22 2033  WBC 9.2  HGB 14.3  HCT 46.4*  MCV 87.4  PLT 249   Cardiac Enzymes: No results for input(s): "CKTOTAL", "CKMB", "CKMBINDEX", "TROPONINI" in the last 168 hours. BNP (last 3 results) No results for input(s): "PROBNP" in the last 8760 hours. CBG: Recent Labs  Lab 05/30/22 0357 05/30/22 0838  GLUCAP 155* 165*   D-Dimer: No results for input(s): "DDIMER" in the last 72 hours. Hgb A1c: No results for input(s): "HGBA1C" in the last 72 hours. Lipid Profile: No results for input(s): "CHOL", "HDL", "LDLCALC", "TRIG", "CHOLHDL", "LDLDIRECT" in the last 72 hours. Thyroid function studies: No results for input(s): "  TSH", "T4TOTAL", "T3FREE", "THYROIDAB" in the last 72 hours.  Invalid input(s): "FREET3" Anemia work up: No results for input(s): "VITAMINB12", "FOLATE", "FERRITIN", "TIBC", "IRON", "RETICCTPCT" in the last 72 hours. Sepsis Labs: Recent Labs  Lab 05/29/22 2033  WBC 9.2   Microbiology No results found for this or any previous visit (from the past 240 hour(s)).   Medications:    amLODipine  5 mg Oral Daily   atorvastatin  40 mg Oral Daily   calcium acetate  667 mg Oral TID WC   ezetimibe  10 mg Oral Daily   insulin aspart  0-6 Units Subcutaneous Q4H   insulin glargine-yfgn  8 Units Subcutaneous QHS   pantoprazole (PROTONIX) IV  40 mg Intravenous Q24H   Continuous Infusions:    LOS: 0 days   Charlynne Cousins  [facility]  05/30/2022, 9:26 AM

## 2022-05-30 NOTE — ED Notes (Signed)
Patient is reporting 10/10 chest pain similar to her arrival complain. MD notified.

## 2022-05-30 NOTE — Progress Notes (Signed)
Pt admitted from ED. Pt having lots of nausea. Pt has bilateral BKA.  Skin is dry and very flaky.  Pt does have a very small pink open spot on her upper inner right thigh close, possibly from depends/diapers. Pt very hard of hearing.  Pt states, "yes" to having hearing aids, but not with her.  Pt attempted to eat a piece of ice, but immediately started to dry heave and vomit clear spit.  After giving phenergan po, pt did the same thing, vomiting up the small bit of water and pills.  Pt states she cannot remember when she last had a BM.  Pt states her husband takes care of her at home and she is completely dependent.  Notified Dr.Ortiz and orders were placed.  Will continue to monitor.

## 2022-05-31 ENCOUNTER — Encounter (HOSPITAL_COMMUNITY): Payer: Self-pay | Admitting: Internal Medicine

## 2022-05-31 DIAGNOSIS — I509 Heart failure, unspecified: Secondary | ICD-10-CM | POA: Diagnosis not present

## 2022-05-31 DIAGNOSIS — E8770 Fluid overload, unspecified: Secondary | ICD-10-CM | POA: Diagnosis not present

## 2022-05-31 DIAGNOSIS — N186 End stage renal disease: Secondary | ICD-10-CM | POA: Diagnosis not present

## 2022-05-31 DIAGNOSIS — J810 Acute pulmonary edema: Secondary | ICD-10-CM | POA: Diagnosis not present

## 2022-05-31 LAB — GLUCOSE, CAPILLARY
Glucose-Capillary: 135 mg/dL — ABNORMAL HIGH (ref 70–99)
Glucose-Capillary: 137 mg/dL — ABNORMAL HIGH (ref 70–99)
Glucose-Capillary: 126 mg/dL — ABNORMAL HIGH (ref 70–99)
Glucose-Capillary: 103 mg/dL — ABNORMAL HIGH (ref 70–99)

## 2022-05-31 LAB — HEPATITIS B SURFACE ANTIBODY,QUALITATIVE: Hep B S Ab: NONREACTIVE

## 2022-05-31 LAB — HEPATITIS C ANTIBODY: HCV Ab: NONREACTIVE

## 2022-05-31 LAB — HEPATITIS B SURFACE ANTIGEN: Hepatitis B Surface Ag: NONREACTIVE

## 2022-05-31 LAB — HEPATITIS B CORE ANTIBODY, TOTAL: Hep B Core Total Ab: NONREACTIVE

## 2022-05-31 MED ORDER — CHLORHEXIDINE GLUCONATE CLOTH 2 % EX PADS: 6.0000 | MEDICATED_PAD | Freq: Every day | CUTANEOUS | Status: DC

## 2022-05-31 MED ORDER — PANTOPRAZOLE SODIUM 40 MG IV SOLR
40.0000 mg | Freq: Two times a day (BID) | INTRAVENOUS | Status: DC
  Administered 2022-05-31 – 2022-06-02 (×5): 40 mg via INTRAVENOUS
  Filled 2022-05-31 (×5): qty 10

## 2022-05-31 MED ORDER — HEPARIN SODIUM (PORCINE) 1000 UNIT/ML IJ SOLN
INTRAMUSCULAR | Status: AC
  Filled 2022-05-31: qty 4

## 2022-05-31 MED ORDER — METOCLOPRAMIDE HCL 5 MG/ML IJ SOLN
5.0000 mg | Freq: Three times a day (TID) | INTRAMUSCULAR | Status: DC
  Administered 2022-05-31 – 2022-06-02 (×6): 5 mg via INTRAVENOUS
  Filled 2022-05-31 (×6): qty 2

## 2022-05-31 MED ORDER — SORBITOL 70 % SOLN
960.0000 mL | TOPICAL_OIL | Freq: Once | ORAL | Status: AC
  Administered 2022-05-31: 960 mL via RECTAL
  Filled 2022-05-31: qty 473

## 2022-05-31 MED ORDER — CHLORHEXIDINE GLUCONATE CLOTH 2 % EX PADS
6.0000 | MEDICATED_PAD | Freq: Every day | CUTANEOUS | Status: DC
  Administered 2022-06-01 – 2022-06-04 (×4): 6 via TOPICAL

## 2022-05-31 MED ORDER — HEPARIN SODIUM (PORCINE) 1000 UNIT/ML DIALYSIS
2500.0000 [IU] | INTRAMUSCULAR | Status: DC | PRN
Start: 2022-05-31 — End: 2022-05-31
  Filled 2022-05-31 (×2): qty 3

## 2022-05-31 MED ORDER — ISOSORBIDE MONONITRATE ER 30 MG PO TB24
30.0000 mg | ORAL_TABLET | Freq: Every day | ORAL | Status: DC
  Administered 2022-05-31: 30 mg via ORAL
  Filled 2022-05-31: qty 1

## 2022-05-31 NOTE — Progress Notes (Signed)
Received patient in bed, alert and oriented. Informed consent signed and in chart.  Time tx initiated:1451  Pre HD weight:unable to obtain  Pre HD VS:see chart  Time tx completed:1846  HD treatment completed. Patient tolerated well. Fistula/Graft/HD catheter without signs and symptoms of complications. Patient transported back to the room, alert and orient and in no acute distress. Report given to bedside RN.  Total UF removed: 2.7   Medication given:None  Post HD VS:see chart  Post HD weight: unable to obtain

## 2022-05-31 NOTE — Consult Note (Signed)
Renal Service Consult Note [facility]  Nancy [name] 05/31/2022 Sol Blazing, MD Requesting Physician: Dr. Aileen Fass  Reason for Consult: ESRD pt w/ chest pain HPI: The patient is a 61 y.o. year-old w/ hx of anemia, CHF, ESRD on HD, HLH, HTN, sp CVA, recent bilat BKA in April 2023 who presented c/o chest pain to ED on 6/13. In ED CXR showed pulm edema but patient was not hypoxic. Pt admitted. We are asked to see for ESRD.    Pt seen in HD.  Pt living at [facility]. Last HD was Monday, she missed Wednesday. Labs showed K 4.0, wBC 9K Hb 14, trop 17, EKG w/o acute changes. CXR showed pulm edema. BP's were high, she got IV labetalol. Pt was vomiting in ED and the day before. She rec'd IV reglan and compazine. Nausea is better today.    ROS - denies CP, no joint pain, no HA, no blurry vision, no rash, no diarrhea, no nausea/ vomiting   Past Medical History  Past Medical History:  Diagnosis Date   Anemia 2006   Carotid artery disease ([place])    right ICA occlusion, 76-16% LICA 0/7371 [place]   CHF (congestive heart failure) ([place])    Chronic kidney disease    Coronary artery disease    Depression 2014   previously on amitryptiline    Diabetes mellitus with neurological manifestation ([place]) 2006   Diabetic peripheral neuropathy ([place]) 04/26/2020   Dyspnea    Fracture of left ankle 1997   Gastroparesis 07/2016   Heart murmur    HOH (hard of hearing) 2004   Hyperlipidemia 2006   Hypertension 2006   IBS (irritable bowel syndrome) 2002   Leukopenia 2015   Macular degeneration 11/2019   Shingles 2009   Stroke [facility]) 2020   Thyroid nodule 2004   Past Surgical History  Past Surgical History:  Procedure Laterality Date   ABDOMINAL HYSTERECTOMY  2005   AMPUTATION Bilateral 04/04/2022   Procedure: BILATERAL BELOW KNEE AMPUTATION;  Surgeon: Newt Minion, MD;  Location: [place];  Service: Orthopedics;  Laterality: Bilateral;   AV FISTULA PLACEMENT Right  12/22/2021   Procedure: RIGHT ARM ARTERIOVENOUS (AV) BRACIOCPHELAIC FISTULA CREATION;  Surgeon: Waynetta Sandy, MD;  Location: [place];  Service: Vascular;  Laterality: Right;   AV FISTULA PLACEMENT Right 02/19/2022   Procedure: RIGHT ARTERIOVENOUS FISTULA;  Surgeon: Angelia Mould, MD;  Location: [place];  Service: Vascular;  Laterality: Right;   CATARACT EXTRACTION Left 11/2019   CESAREAN SECTION  1983    CHOLECYSTECTOMY N/A 03/05/2020   Procedure: LAPAROSCOPIC CHOLECYSTECTOMY WITH INTRAOPERATIVE CHOLANGIOGRAM;  Surgeon: Donnie Mesa, MD;  Location: [facility];  Service: General;  Laterality: N/A;   ESOPHAGOGASTRODUODENOSCOPY (EGD) WITH PROPOFOL Left 08/26/2014   Procedure: ESOPHAGOGASTRODUODENOSCOPY (EGD) WITH PROPOFOL;  Surgeon: Arta Silence, MD;  Location: [facility];  Service: Endoscopy;  Laterality: Left;   ESOPHAGOGASTRODUODENOSCOPY (EGD) WITH PROPOFOL N/A 03/03/2020   Procedure: ESOPHAGOGASTRODUODENOSCOPY (EGD) WITH PROPOFOL;  Surgeon: Jerene Bears, MD;  Location: [facility];  Service: Gastroenterology;  Laterality: N/A;   IR FLUORO GUIDE CV LINE RIGHT  02/15/2022   IR US GUIDE VASC ACCESS RIGHT  02/15/2022   RIGHT HEART CATH N/A 07/14/2021   Procedure: RIGHT HEART CATH;  Surgeon: Larey Dresser, MD;  Location: [place] [facility];  Service: Cardiovascular;  Laterality: N/A;   RIGHT/LEFT HEART CATH AND CORONARY ANGIOGRAPHY N/A 04/27/2021   Procedure: RIGHT/LEFT HEART CATH AND CORONARY ANGIOGRAPHY;  Surgeon: Gwenlyn Found,  Pearletha Forge, MD;  Location: [place] [facility];  Service: Cardiovascular;  Laterality: N/A;   Family History  Family History  Problem Relation Age of Onset   Hypertension Mother    Heart disease Mother    Diabetes Mother    Thyroid disease Mother    Congestive Heart Failure Mother    Breast cancer Maternal Grandmother    Colon cancer Maternal Grandfather    Heart attack Sister    Heart disease Brother    Hyperlipidemia Brother    Hypertension Brother     Diabetes Father    Breast cancer Maternal Aunt    Social History  reports that she has quit smoking. Her smoking use included cigarettes. She has a 0.13 pack-year smoking history. She has never used smokeless tobacco. She reports that she does not drink alcohol and does not use drugs. Allergies  Allergies  Allergen Reactions   Hydralazine Hcl Other (See Comments)    Hair loss   Hydrocodone Itching and Other (See Comments)    Upset stomach   Metformin And Related Nausea And Vomiting and Other (See Comments)    Stomach pains, also   Other Nausea Only and Other (See Comments)    Lettuce- Does not digest this!!   Plaquenil [Hydroxychloroquine Sulfate] Hives   Shellfish-Derived Products Nausea Only and Other (See Comments)    Caused an upset stomach   Shrimp (Diagnostic) Nausea Only and Other (See Comments)    Upset stomach    Sulfa Antibiotics Hives   Home medications Prior to Admission medications   Medication Sig Start Date End Date Taking? Authorizing Provider  amLODipine (NORVASC) 5 MG tablet Take 1 tablet (5 mg total) by mouth daily. 04/11/22  Yes Danford, Suann Larry, MD  aspirin 81 MG EC tablet Take 1 tablet (81 mg total) by mouth daily. 09/26/21  Yes Milford, Maricela Bo, FNP  atorvastatin (LIPITOR) 40 MG tablet Take 1 tablet (40 mg total) by mouth daily. 11/07/21  Yes Larey Dresser, MD  calcium acetate (PHOSLO) 667 MG capsule TAKE 1 CAPSULE (667 MG TOTAL) BY MOUTH 3 (THREE) TIMES DAILY WITH MEALS. 02/08/22  Yes [name], MD  Continuous Blood Gluc Receiver (DEXCOM G6 RECEIVER) DEVI 1 Device by Does not apply route daily. 11/15/21  Yes [name], MD  Continuous Blood Gluc Sensor (DEXCOM G6 SENSOR) MISC 1 packet by Does not apply route daily. 11/15/21  Yes [name], MD  Continuous Blood Gluc Transmit (DEXCOM G6 TRANSMITTER) MISC 1 packet by Does not apply route daily. 11/15/21  Yes [name], MD  ezetimibe (ZETIA) 10 MG tablet Take 1 tablet (10  mg total) by mouth daily. 04/11/22  Yes Danford, Suann Larry, MD  ferrous sulfate 325 (65 FE) MG tablet Take 1 tablet (325 mg total) by mouth daily with breakfast. 11/07/21  Yes [name], MD  HYDROmorphone (DILAUDID) 2 MG tablet Take 1-1.5 tablets (2-3 mg total) by mouth every 4 (four) hours as needed for severe pain (pain score 7-10). 04/10/22  Yes Danford, Suann Larry, MD  insulin glargine (LANTUS) 100 UNIT/ML Solostar Pen Inject 10 Units into the skin at bedtime. Patient taking differently: Inject 15 Units into the skin at bedtime. 04/10/22  Yes Danford, Suann Larry, MD  insulin lispro (HUMALOG KWIKPEN) 100 UNIT/ML KwikPen Inject 3 units before breakfast and dinner. Patient taking differently: Inject 4 Units into the skin 2 (two) times daily with a meal. 04/10/22  Yes Danford, Suann Larry, MD  Insulin Pen  Needle 32G X 4 MM MISC Use to inject insulin as directed. 11/07/21  Yes [name], MD  multivitamin (RENA-VIT) TABS tablet Take 1 tablet by mouth at bedtime. 04/10/22  Yes Danford, Suann Larry, MD  ondansetron (ZOFRAN) 8 MG tablet Take 1 tablet (8 mg total) by mouth every 8 (eight) hours as needed for nausea 05/23/22  Yes   pantoprazole (PROTONIX) 40 MG tablet Take 1 tablet (40 mg total) by mouth daily. 04/11/22  Yes Danford, Suann Larry, MD  promethazine (PHENERGAN) 25 MG tablet Take 25-50 mg by mouth every 8 (eight) hours as needed for vomiting or nausea. 04/23/22  Yes [provider]  zinc sulfate 220 (50 Zn) MG capsule Take 1 capsule (220 mg total) by mouth daily. 04/11/22  Yes Danford, Suann Larry, MD  Blood Glucose Monitoring Suppl (ONETOUCH VERIO) w/Device KIT Check blood sugar three times daily. E11.40 Patient not taking: Reported on 05/29/2022 05/06/20   [name], MD  glucose blood test strip Use as instructedCheck blood sugar three times daily. E11.40 Patient not taking: Reported on 05/29/2022 07/11/20   [name], MD  Insulin NPH Isophane  & Regular (RELION 70/30 [place]) Inject 35 Units into the skin 2 (two) times daily.  08/17/14  [provider]     Vitals:   05/31/22 0616 05/31/22 0740 05/31/22 1040 05/31/22 1120  BP: (!) 193/88 (!) 184/79 (!) 190/88 (!) 161/70  Pulse:  88 91 82  Resp:  18    Temp:  98.3 F (36.8 C)    TempSrc:  Oral    SpO2:  100%    Weight:       Exam Gen alert, no distress, chronically ill appearing, pleasant No rash, cyanosis or gangrene Sclera anicteric, throat clear  No jvd or bruits Chest clear bilat to bases, no rales/ wheezing RRR no MRG Abd soft ntnd no mass or ascites +bs GU defer MS no joint effusions or deformity Ext bilat BKA, wounds intact Neuro is alert, Ox 3 , nf    RIJ TDC in place    Home meds include - amlodipine 5, aspirin, atorvastatin, calcium acetate 3 ac, ezetimibe, ferrous sulfate, hydromorphone, insulin glargine/ lispro, renavite, ondansetron, pantoprazole, promethazine   CXR - IMPRESSION: Borderline cardiomegaly with mild perihilar interstitial opacities, favoring pulmonary edema given dialysis catheter.    OP HD: MWF [facility]  4h 59mn  400/1.5  92kg   2/2 bath  TDC  Hep 5000+ [id number] prn - missed HD 6/14 - mircera 200 ug q2, last 5/17 - recent UF 0, 0.3, 2.0 - last Hb 10 on 6/07   Assessment/ Plan: Pulm edema  - by CXR, in esrd patient here w/ chest pain. Was not hypoxic but BP's quite high. Needs HD today (missed yesterday) and probably needs lowering of dry wt.  Chest pain - ruled out for MI.  N/V - per pmd HTN'sive urgency - getting norvasc here, needs vol reduction w/ HD ESRD - on HD MWF. Missed HD 6/14. HD today and again tomorrow.  Vol - bp's high, vol overload and pulm edema on CXR. As above.  Anemia esrd - Hb 14, no esa needs MBD ckd - Ca and phos in range, cont binder.  Hx CVA IDDM - per pmd      RoKelly SplinterMD 05/31/2022, 12:35 PM Recent Labs  Lab 05/29/22 2033 05/29/22 2233 05/30/22 0358  HGB 14.3  --   --   ALBUMIN  --  3.4*  3.3*  CALCIUM  9.8  --  9.3  PHOS  --   --  3.4  CREATININE 2.42*  --  2.58*  K 4.0  --  3.4*   Inpatient medications:  amLODipine  10 mg Oral Daily   aspirin EC  81 mg Oral Daily   atorvastatin  40 mg Oral Daily   calcium acetate  667 mg Oral TID WC   Chlorhexidine Gluconate Cloth  6 each Topical Daily   ezetimibe  10 mg Oral Daily   insulin glargine-yfgn  8 Units Subcutaneous QHS   isosorbide mononitrate  30 mg Oral Daily   metoCLOPramide (REGLAN) injection  5 mg Intravenous Q8H   pantoprazole (PROTONIX) IV  40 mg Intravenous Q12H   polyethylene glycol  17 g Oral BID    acetaminophen **OR** acetaminophen, HYDROmorphone, labetalol, ondansetron (ZOFRAN) IV, prochlorperazine

## 2022-05-31 NOTE — Procedures (Signed)
   I was present at this dialysis session, have reviewed the session itself and made  appropriate changes Kelly Splinter MD [place] pager [phone]   05/31/2022, 3:20 PM

## 2022-05-31 NOTE — TOC Progression Note (Signed)
Transition of Care [facility]) - Initial/Assessment Note    Patient Details  Name: Nancy [name] MRN: [id number] Date of Birth: Jul 06, 1961  Transition of Care [facility]) CM/SW Contact:    Milinda Antis, LCSWA Phone Number: 05/31/2022, 8:47 AM  Clinical Narrative:                 CSW contacted admissions at [facility].  The patient is ST at the facility and can return when medically ready.  TOC will continue to follow for d/c planning.         Patient Goals and CMS Choice        Expected Discharge Plan and Services                                                Prior Living Arrangements/Services                       Activities of Daily Living Home Assistive Devices/Equipment: None ADL Screening (condition at time of admission) Patient's cognitive ability adequate to safely complete daily activities?: Yes Is the patient deaf or have difficulty hearing?: Yes Does the patient have difficulty seeing, even when wearing glasses/contacts?: No Does the patient have difficulty concentrating, remembering, or making decisions?: Yes Patient able to express need for assistance with ADLs?: Yes Does the patient have difficulty dressing or bathing?: Yes Independently performs ADLs?: No Communication: Independent Dressing (OT): Needs assistance Is this a change from baseline?: Pre-admission baseline Grooming: Needs assistance Is this a change from baseline?: Pre-admission baseline Feeding: Independent Bathing: Needs assistance Is this a change from baseline?: Pre-admission baseline Toileting: Needs assistance Is this a change from baseline?: Pre-admission baseline In/Out Bed: Needs assistance Is this a change from baseline?: Pre-admission baseline Walks in Home: Dependent Is this a change from baseline?: Pre-admission baseline Does the patient have difficulty walking or climbing stairs?: Yes Weakness of Legs: Both (bka) Weakness of Arms/Hands:  Both  Permission Sought/Granted                  Emotional Assessment              Admission diagnosis:  Acute pulmonary edema ([place]) [J81.0] Chest pain [R07.9] Chest pain, unspecified type [R07.9] Hypertension, unspecified type [I10] Patient Active Problem List   Diagnosis Date Noted   Chest pain 05/30/2022   Volume overload 05/30/2022   Acute pulmonary edema ([place]) 05/30/2022   Nausea 04/10/2022   Hyponatremia 04/04/2022   Prolonged QT interval 04/04/2022   Pressure injury of skin 02/19/2022   Renal anasarca 02/14/2022   ESRD on dialysis ([place]) 02/14/2022   Hyperkalemia 02/14/2022   S/P arteriovenous (AV) fistula creation 12/22/2021   Critical limb ischemia of both lower extremities ([employer]) 11/22/2021   Subacute osteomyelitis, right ankle and foot ([place])    Peripheral artery disease ([place]) 10/27/2021   Acute exacerbation of congestive heart failure ([place]) 10/24/2021   Acute cardiogenic pulmonary edema ([place]) 10/06/2021   CAD (coronary artery disease) 10/06/2021   Goals of care, counseling/discussion    Gangrene of left foot ([place])    Cellulitis of left foot    Diabetic foot ulcers ([place]) 09/04/2021   CHF (congestive heart failure) ([place]) 07/07/2021   Acute postoperative anemia due to expected blood loss superimposed on anemia of chronic renal insufficiency 06/30/2021   CKD (chronic kidney disease), stage  IV ([place]) 06/11/2021   Chronic diastolic CHF (congestive heart failure) ([place]) 06/10/2021   Abnormal nuclear stress test    Dyspnea on exertion 03/07/2021   Orthopnea 02/25/2021   History of cerebrovascular accident (CVA) with residual deficit 05/06/2020   Diabetic peripheral neuropathy ([place]) 04/26/2020   AKI (acute kidney injury) ([place]) 04/20/2020   Generalized weakness 04/20/2020   Dehydration 04/20/2020   Generalized abdominal pain    Pancreatitis, acute 02/29/2020   Cerebrovascular accident (CVA) ([place]) 11/08/2019   Stenosis of right carotid artery 11/08/2019    Mixed diabetic hyperlipidemia associated with type 2 diabetes mellitus ([place]) 11/08/2019   Cortical age-related cataract of both eyes 08/30/2019   Gait abnormality 08/30/2019   Statin declined 08/30/2019   Gastroesophageal reflux disease without esophagitis 04/18/2018   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   Insulin dependent type 2 diabetes mellitus ([place]) 10/29/2017   History of macular degeneration 10/29/2017   Chronic cervical pain 10/29/2016   Myalgia 09/14/2016   Insomnia 09/14/2016   Tinea pedis of both feet 08/20/2016   Macular degeneration 08/17/2016   Low back pain 09/21/2014   Hypertensive urgency 09/21/2014   Diabetic gastroparesis associated with type 2 diabetes mellitus ([place]) 09/14/2014   HTN (hypertension) 09/08/2014   Thyroid nodule 08/17/2014   Morbid obesity ([place]) 08/17/2014   Hypokalemia 08/06/2014   Type II diabetes mellitus with neurological manifestations, uncontrolled 08/04/2014   PCP:  [name], MD Pharmacy:   [place], [place] - [address] [place] [address] Phone: [phone] Fax: [facility] [address] [place] [address] Phone: [phone] Fax: [phone]     Social Determinants of Health (SDOH) Interventions    Readmission Risk Interventions    02/16/2022   11:55 AM 09/21/2021    9:05 AM 03/07/2020    2:36 PM  Readmission Risk Prevention Plan  Transportation Screening Complete Complete Complete  PCP or Specialist Appt within 3-5 Days   Complete  Social Work Consult for [place] Planning/Counseling   Complete  Palliative Care Screening   Not Applicable  Medication Review Press photographer) Complete Complete Complete  PCP or Specialist appointment within 3-5 days of discharge  Complete   HRI or [place]  Complete   SW Recovery Care/Counseling Consult  Complete   Palliative Care Screening  Complete   [place]   Not Applicable

## 2022-05-31 NOTE — Progress Notes (Signed)
[facility] PROGRESS NOTE    Progress Note  Nancy [name]  JIR:[id number] DOB: 04-27-1961 DOA: 05/29/2022 PCP: [name], MD     Brief Narrative:   Nancy [name] is an 61 y.o. female past medical history significant for end-stage renal disease on hemodialysis Monday Wednesday and Friday, insulin-dependent diabetes mellitus type 2, chronic diastolic heart failure, essential hypertension, CVA, discharge in April 2023 for gangrenous foot ulcer bilateral with cellulitis and osteomyelitis, status post BKA bilaterally comes into the ED with acute onset of chest pain nausea and vomiting with a blood pressure of 186/100.  White count of 19 hemoglobin of 14 platelet count of 300 twelve-lead EKG showed no signs of ischemia chest x-ray showed pulmonary edema satting 90% on room air was started on nitro Compazine and IV labetalol.   Assessment/Plan:   Volume overload/  ESRD on dialysis (HCC)/acute pulmonary edema: 2D echo in 2022 showed an EF of 75% grade 2 diastolic heart failure. Chest x-ray showed mild pulmonary edema. 100% on room air, awaiting nephrology recommendations. She usually dialyzes Monday Wednesday Friday Nephrology has been consulted for HD.  Intractable nausea vomiting and abdominal pain chronic LFTs unremarkable ,, she relates her nausea and vomiting return she has vomited twice this morning.  Her abdominal pain is unchanged. Started on Reglan and IV Protonix twice daily. She has not had a bowel movement in 5 days we will go ahead and give her a smog enema, she relates she tolerated her MiraLAX. CT scan of the abdomen and pelvis showed no acute findings. Keep her n.p.o.  Hypertensive urgency: Started on IV labetalol,  Continue amlodipine antibiotic Norvasc.  Insulin dependent type 2 diabetes mellitus ([place]) She relates, she has been a diabetic for 4 years Blood glucose relatively controlled continue sliding scale insulin.  CAD: Twelve-lead EKG  showed no signs of ischemia cardiac biomarkers have basically remained flat. Continue Lipitor and Zetia and aspirin.  Peripheral vascular disease: Continue current regimen.  History of CVA: At resume aspirin Lipitor and Zetia.   Unstageable left heel ulcer present on admission: RN Pressure Injury Documentation: Pressure Injury 02/15/22 Heel Left Unstageable - Full thickness tissue loss in which the base of the injury is covered by slough (yellow, tan, gray, green or brown) and/or eschar (tan, brown or black) in the wound bed. (Active)  02/15/22 1856  Location: Heel  Location Orientation: Left  Staging: Unstageable - Full thickness tissue loss in which the base of the injury is covered by slough (yellow, tan, gray, green or brown) and/or eschar (tan, brown or black) in the wound bed.  Wound Description (Comments):   Present on Admission: Yes    Estimated body mass index is 36.01 kg/m as calculated from the following:   Height as of 03/29/22: '5\' 3"'$  (1.6 m).   Weight as of this encounter: 92.2 kg.     DVT prophylaxis: lovenox Family Communication:none Status is: Observation The patient remains OBS appropriate and will d/c before 2 midnights.    Code Status:     Code Status Orders  (From admission, onward)           Start     Ordered   05/30/22 0233  Do not attempt resuscitation (DNR)  Continuous       Question Answer Comment  In the event of cardiac or respiratory ARREST Do not call a "code blue"   In the event of cardiac or respiratory ARREST Do not perform Intubation, CPR, defibrillation or ACLS   In  the event of cardiac or respiratory ARREST Use medication by any route, position, wound care, and other measures to relive pain and suffering. May use oxygen, suction and manual treatment of airway obstruction as needed for comfort.      05/30/22 0232           Code Status History     Date Active Date Inactive Code Status Order ID Comments User Context    05/30/2022 0205 05/30/2022 0232 Full Code [id number]  Shela Leff, MD ED   03/29/2022 2207 04/10/2022 2305 DNR [id number]  Shela Leff, MD ED   03/29/2022 2148 03/29/2022 2207 Full Code [id number]  Shela Leff, MD ED   02/14/2022 1748 02/21/2022 1742 Full Code [id number]  Darliss Cheney, MD ED   10/24/2021 2107 11/01/2021 2305 DNR [id number]  Rehman, [place], DO ED   10/06/2021 0419 10/10/2021 1731 Full Code [id number]  Vernelle Emerald, MD ED   09/11/2021 1131 09/21/2021 1816 DNR [id number]  Norberta Keens, PA-C Inpatient   09/04/2021 1613 09/11/2021 1131 Full Code [id number]  Rick Duff, MD ED   07/07/2021 1936 07/23/2021 2303 Full Code [id number]  Tommie Raymond, NP Inpatient   06/10/2021 1708 06/15/2021 1635 Full Code [id number]  Jonnie Finner, DO Inpatient   05/09/2021 [place] 05/12/2021 1720 Full Code [id number]  Harold Hedge, MD ED   04/27/2021 0847 04/27/2021 1712 Full Code [id number]  [name], MD Inpatient   04/20/2020 2315 04/22/2020 1629 Full Code [id number]  Eugenie Filler, MD ED   02/29/2020 1813 03/08/2020 0317 Full Code [id number]  Kayleen Memos, DO ED   07/23/2016 0816 07/26/2016 2124 Full Code [id number]  Rondel Jumbo, PA-C Inpatient   09/07/2014 1800 09/14/2014 1757 Full Code [id number]  Modena Jansky, MD Inpatient   08/23/2014 0236 08/26/2014 2109 Full Code [id number]  Bonnielee Haff, MD Inpatient   08/04/2014 1347 08/08/2014 1810 Full Code [id number]  Rama, Venetia Maxon, MD Inpatient         IV Access:   Peripheral IV   Procedures and diagnostic studies:   CT ABDOMEN PELVIS WO CONTRAST  Result Date: 05/30/2022 CLINICAL DATA:  Abdominal pain. EXAM: CT ABDOMEN AND PELVIS WITHOUT CONTRAST TECHNIQUE: Multidetector CT imaging of the abdomen and pelvis was performed following the standard protocol without IV contrast. RADIATION DOSE REDUCTION: This exam was performed according to the departmental dose-optimization program which includes automated exposure control,  adjustment of the mA and/or kV according to patient size and/or use of iterative reconstruction technique. COMPARISON:  CT abdomen pelvis dated 02/29/2020. FINDINGS: Evaluation of this exam is limited in the absence of intravenous contrast. Lower chest: There are bibasilar linear and streaky atelectasis and scarring. Slight subpleural nodularity of the posterior lung bases likely related to trace pleural effusions. The tip of the dialysis catheter noted in the right atrium. No intra-abdominal free air or free fluid. Hepatobiliary: The liver is unremarkable. No intrahepatic biliary dilatation. Cholecystectomy. No retained calcified stone noted in the central CBD. Pancreas: There is slight prominence of the uncinate process of the pancreas with mild atrophy of the body of the pancreas. Overall decrease in the size of the tissue in the head and uncinate process of the pancreas compared to prior CT. No significant inflammatory changes. No fluid collection. Spleen: Normal in size without focal abnormality. Adrenals/Urinary Tract: The adrenal glands unremarkable. The kidneys, visualized ureters, and urinary bladder appear unremarkable. Stomach/Bowel: There is moderate amount of stool throughout the colon. There is no bowel obstruction or active  inflammation. The appendix is normal. Vascular/Lymphatic: The abdominal aorta and IVC are unremarkable. No portal venous gas. There is no adenopathy. Reproductive: Hysterectomy.  No adnexal masses. Other: There is diffuse subcutaneous edema. Musculoskeletal: No acute or significant osseous findings. IMPRESSION: 1. No acute intra-abdominal or pelvic pathology. 2. Constipation.  No bowel obstruction. Normal appendix. Electronically Signed   By: Anner Crete M.D.   On: 05/30/2022 03:39   DG Chest 2 View  Result Date: 05/29/2022 CLINICAL DATA:  Chest pain EXAM: CHEST - 2 VIEW COMPARISON:  03/18/2022 FINDINGS: Lateral view degraded by patient arm position. Mild right  hemidiaphragm elevation. Right IJ dialysis catheter terminates in right atrium. Midline trachea. Borderline cardiomegaly. No pleural effusion or pneumothorax. Mild perihilar interstitial opacities. IMPRESSION: Borderline cardiomegaly with mild perihilar interstitial opacities, favoring pulmonary edema given dialysis catheter. Electronically Signed   By: Abigail Miyamoto M.D.   On: 05/29/2022 21:15     Medical Consultants:   None.   Subjective:    AMAURA AUTHIER still nauseated has not been able to tolerate a diet.  Objective:    Vitals:   05/31/22 0447 05/31/22 0500 05/31/22 0616 05/31/22 0740  BP:   (!) 193/88 (!) 184/79  Pulse: 94   88  Resp: 19   18  Temp:    98.3 F (36.8 C)  TempSrc:    Oral  SpO2: 100%   100%  Weight:  92.2 kg     SpO2: 100 %   Intake/Output Summary (Last 24 hours) at 05/31/2022 1007 Last data filed at 05/31/2022 0900 Gross per 24 hour  Intake 80 ml  Output --  Net 80 ml   Filed Weights   05/31/22 0500  Weight: 92.2 kg    Exam: General exam: In no acute distress. Respiratory system: Good air movement and clear to auscultation. Cardiovascular system: S1 & S2 heard, RRR. No JVD. Gastrointestinal system: Abdomen is nondistended, soft and mild epigastric tenderness to deep palpation..  Extremities: No pedal edema. Skin: No rashes, lesions or ulcers Psychiatry: Judgement and insight appear normal. Mood & affect appropriate.   Data Reviewed:    Labs: Basic Metabolic Panel: Recent Labs  Lab 05/29/22 2033 05/30/22 0358  NA 138 136  K 4.0 3.4*  CL 96* 96*  CO2 30 28  GLUCOSE 161* 159*  BUN 11 11  CREATININE 2.42* 2.58*  CALCIUM 9.8 9.3  PHOS  --  3.4    GFR Estimated Creatinine Clearance: 24.4 mL/min (A) (by C-G formula based on SCr of 2.58 mg/dL (H)). Liver Function Tests: Recent Labs  Lab 05/29/22 2233 05/30/22 0358  AST 37  --   ALT 35  --   ALKPHOS 99  --   BILITOT 1.2  --   PROT 7.3  --   ALBUMIN 3.4* 3.3*     Recent Labs  Lab 05/29/22 2233  LIPASE 20    No results for input(s): "AMMONIA" in the last 168 hours. Coagulation profile No results for input(s): "INR", "PROTIME" in the last 168 hours. COVID-19 Labs  No results for input(s): "DDIMER", "FERRITIN", "LDH", "CRP" in the last 72 hours.  Lab Results  Component Value Date   SARSCOV2NAA NEGATIVE 03/30/2022   SARSCOV2NAA NEGATIVE 02/14/2022   SARSCOV2NAA NEGATIVE 10/24/2021   [place] NEGATIVE 10/05/2021    CBC: Recent Labs  Lab 05/29/22 2033  WBC 9.2  HGB 14.3  HCT 46.4*  MCV 87.4  PLT 249    Cardiac Enzymes: No results for input(s): "CKTOTAL", "CKMB", "CKMBINDEX", "TROPONINI" in  the last 168 hours. BNP (last 3 results) No results for input(s): "PROBNP" in the last 8760 hours. CBG: Recent Labs  Lab 05/30/22 1617 05/30/22 2026 05/30/22 2327 05/31/22 0446 05/31/22 0738  GLUCAP 141* 147* 147* 135* 137*    D-Dimer: No results for input(s): "DDIMER" in the last 72 hours. Hgb A1c: No results for input(s): "HGBA1C" in the last 72 hours. Lipid Profile: No results for input(s): "CHOL", "HDL", "LDLCALC", "TRIG", "CHOLHDL", "LDLDIRECT" in the last 72 hours. Thyroid function studies: No results for input(s): "TSH", "T4TOTAL", "T3FREE", "THYROIDAB" in the last 72 hours.  Invalid input(s): "FREET3" Anemia work up: No results for input(s): "VITAMINB12", "FOLATE", "FERRITIN", "TIBC", "IRON", "RETICCTPCT" in the last 72 hours. Sepsis Labs: Recent Labs  Lab 05/29/22 2033  WBC 9.2    Microbiology No results found for this or any previous visit (from the past 240 hour(s)).   Medications:    amLODipine  10 mg Oral Daily   aspirin EC  81 mg Oral Daily   atorvastatin  40 mg Oral Daily   calcium acetate  667 mg Oral TID WC   Chlorhexidine Gluconate Cloth  6 each Topical Daily   ezetimibe  10 mg Oral Daily   insulin glargine-yfgn  8 Units Subcutaneous QHS   pantoprazole (PROTONIX) IV  40 mg Intravenous Q24H    polyethylene glycol  17 g Oral BID   Continuous Infusions:    LOS: 1 day   Charlynne Cousins  [facility]  05/31/2022, 10:07 AM

## 2022-06-01 ENCOUNTER — Other Ambulatory Visit (HOSPITAL_COMMUNITY): Payer: Self-pay

## 2022-06-01 DIAGNOSIS — N186 End stage renal disease: Secondary | ICD-10-CM | POA: Diagnosis not present

## 2022-06-01 DIAGNOSIS — E8770 Fluid overload, unspecified: Secondary | ICD-10-CM | POA: Diagnosis not present

## 2022-06-01 DIAGNOSIS — J810 Acute pulmonary edema: Secondary | ICD-10-CM | POA: Diagnosis not present

## 2022-06-01 DIAGNOSIS — I509 Heart failure, unspecified: Secondary | ICD-10-CM | POA: Diagnosis not present

## 2022-06-01 LAB — GLUCOSE, CAPILLARY
Glucose-Capillary: 101 mg/dL — ABNORMAL HIGH (ref 70–99)
Glucose-Capillary: 130 mg/dL — ABNORMAL HIGH (ref 70–99)
Glucose-Capillary: 133 mg/dL — ABNORMAL HIGH (ref 70–99)
Glucose-Capillary: 156 mg/dL — ABNORMAL HIGH (ref 70–99)
Glucose-Capillary: 84 mg/dL (ref 70–99)
Glucose-Capillary: 99 mg/dL (ref 70–99)

## 2022-06-01 LAB — HEPATITIS B SURFACE ANTIBODY, QUANTITATIVE: Hep B S AB Quant (Post): <3.1 m[IU]/mL — ABNORMAL LOW (ref >9.9)

## 2022-06-01 MED ORDER — ISOSORBIDE MONONITRATE ER 60 MG PO TB24
60.0000 mg | ORAL_TABLET | Freq: Every day | ORAL | Status: DC
  Administered 2022-06-02: 60 mg via ORAL
  Filled 2022-06-01 (×2): qty 1

## 2022-06-01 MED ORDER — LABETALOL HCL 200 MG PO TABS
200.0000 mg | ORAL_TABLET | Freq: Two times a day (BID) | ORAL | Status: DC
  Administered 2022-06-01 – 2022-06-02 (×3): 200 mg via ORAL
  Filled 2022-06-01 (×3): qty 1

## 2022-06-01 MED ORDER — PROSOURCE PLUS PO LIQD
30.0000 mL | Freq: Two times a day (BID) | ORAL | Status: DC
Start: 2022-06-01 — End: 2022-06-04
  Administered 2022-06-01 – 2022-06-04 (×6): 30 mL via ORAL
  Filled 2022-06-01 (×7): qty 30

## 2022-06-01 MED ORDER — LABETALOL HCL 100 MG PO TABS
100.0000 mg | ORAL_TABLET | Freq: Two times a day (BID) | ORAL | Status: DC
Start: 2022-06-01 — End: 2022-06-01

## 2022-06-01 NOTE — Progress Notes (Signed)
[facility] PROGRESS NOTE    Progress Note  [name]  GNO:[id number] DOB: [birth year] DOA: 05/29/2022 PCP: [name], MD     Brief Narrative:   Nancy [name] is an 61 y.o. female past medical history significant for end-stage renal disease on hemodialysis Monday Wednesday and Friday, insulin-dependent diabetes mellitus type 2, chronic diastolic heart failure, essential hypertension, CVA, discharge in April 2023 for gangrenous foot ulcer bilateral with cellulitis and osteomyelitis, status post BKA bilaterally comes into the ED with acute onset of chest pain nausea and vomiting with a blood pressure of 186/100.  White count of 19 hemoglobin of 14 platelet count of 300 twelve-lead EKG showed no signs of ischemia chest x-ray showed pulmonary edema satting 90% on room air was started on nitro Compazine and IV labetalol.   Assessment/Plan:   Volume overload/  ESRD on dialysis (HCC)/acute pulmonary edema: Chest x-ray showed mild pulmonary edema. Nephrology consulted, she was dialyzed on 05/31/2022, they are hoping to dialyze her again. To remove about 1 L. She refused her dialysis today.  Intractable nausea vomiting and abdominal pain chronic LFTs unremarkable, relates her nausea and vomiting are improved this morning.  Her abdominal pain is improved she was started on IV Reglan and Protonix twice a day. She relates this slightly better, she was also given MiraLAX and has had a bowel movement. We will try her on a clear liquid diet.  Hypertensive urgency: Continue Norvasc and Imdur, go ahead and start her on labetalol increase Imdur  Insulin dependent type 2 diabetes mellitus ([place]) She relates, she has been a diabetic for 4 years Blood glucose relatively controlled continue sliding scale insulin.  CAD: Twelve-lead EKG showed no signs of ischemia cardiac biomarkers have basically remained flat. Continue Lipitor and Zetia and aspirin.  Peripheral vascular  disease: Continue current regimen.  History of CVA: At resume aspirin Lipitor and Zetia.   Unstageable left heel ulcer present on admission: RN Pressure Injury Documentation: Pressure Injury 02/15/22 Heel Left Unstageable - Full thickness tissue loss in which the base of the injury is covered by slough (yellow, tan, gray, green or brown) and/or eschar (tan, brown or black) in the wound bed. (Active)  02/15/22 1856  Location: Heel  Location Orientation: Left  Staging: Unstageable - Full thickness tissue loss in which the base of the injury is covered by slough (yellow, tan, gray, green or brown) and/or eschar (tan, brown or black) in the wound bed.  Wound Description (Comments):   Present on Admission: Yes    Estimated body mass index is 35.85 kg/m as calculated from the following:   Height as of 03/29/22: '5\' 3"'$  (1.6 m).   Weight as of this encounter: 91.8 kg.     DVT prophylaxis: lovenox Family Communication:none Status is: Observation The patient remains OBS appropriate and will d/c before 2 midnights.    Code Status:     Code Status Orders  (From admission, onward)           Start     Ordered   05/30/22 0233  Do not attempt resuscitation (DNR)  Continuous       Question Answer Comment  In the event of cardiac or respiratory ARREST Do not call a "code blue"   In the event of cardiac or respiratory ARREST Do not perform Intubation, CPR, defibrillation or ACLS   In the event of cardiac or respiratory ARREST Use medication by any route, position, wound care, and other measures to relive pain and  suffering. May use oxygen, suction and manual treatment of airway obstruction as needed for comfort.      05/30/22 0232           Code Status History     Date Active Date Inactive Code Status Order ID Comments User Context   05/30/2022 0205 05/30/2022 0232 Full Code [id number]  Shela Leff, MD ED   03/29/2022 2207 04/10/2022 2305 DNR [id number]  Shela Leff,  MD ED   03/29/2022 2148 03/29/2022 2207 Full Code [id number]  Shela Leff, MD ED   02/14/2022 1748 02/21/2022 1742 Full Code [id number]  Darliss Cheney, MD ED   10/24/2021 2107 11/01/2021 2305 DNR [id number]  Rehman, Areeg N, DO ED   10/06/2021 0419 10/10/2021 1731 Full Code [id number]  Vernelle Emerald, MD ED   09/11/2021 1131 09/21/2021 1816 DNR [id number]  Norberta Keens, PA-C Inpatient   09/04/2021 1613 09/11/2021 1131 Full Code [id number]  Rick Duff, MD ED   07/07/2021 1936 07/23/2021 2303 Full Code [id number]  Tommie Raymond, NP Inpatient   06/10/2021 1708 06/15/2021 1635 Full Code [id number]  Jonnie Finner, DO Inpatient   05/09/2021 [place] 05/12/2021 1720 Full Code [id number]  Harold Hedge, MD ED   04/27/2021 0847 04/27/2021 1712 Full Code [id number]  [name], MD Inpatient   04/20/2020 2315 04/22/2020 1629 Full Code [id number]  Eugenie Filler, MD ED   02/29/2020 1813 03/08/2020 0317 Full Code [id number]  Kayleen Memos, DO ED   07/23/2016 0816 07/26/2016 2124 Full Code [id number]  Rondel Jumbo, PA-C Inpatient   09/07/2014 1800 09/14/2014 1757 Full Code [id number]  Modena Jansky, MD Inpatient   08/23/2014 0236 08/26/2014 2109 Full Code [id number]  Bonnielee Haff, MD Inpatient   08/04/2014 1347 08/08/2014 1810 Full Code [id number]  Rama, Venetia Maxon, MD Inpatient         IV Access:   Peripheral IV   Procedures and diagnostic studies:   No results found.   Medical Consultants:   None.   Subjective:    Nancy [name] relates her nausea and vomiting are better, she did not want dialysis today.  Objective:    Vitals:   05/31/22 1930 06/01/22 0436 06/01/22 0500 06/01/22 0953  BP: (!) 177/57 (!) 173/77  (!) 172/82  Pulse: 89 91  92  Resp: '18 19  17  '$ Temp: 98.2 F (36.8 C) 98.1 F (36.7 C)    TempSrc:  Oral    SpO2: 98% 94%  98%  Weight:   91.8 kg    SpO2: 98 %   Intake/Output Summary (Last 24 hours) at 06/01/2022 0956 Last data filed at 06/01/2022 6967 Gross  per 24 hour  Intake 120 ml  Output 2632.3 ml  Net -2512.3 ml    Filed Weights   05/31/22 0500 06/01/22 0500  Weight: 92.2 kg 91.8 kg    Exam: General exam: In no acute distress. Respiratory system: Good air movement and clear to auscultation. Cardiovascular system: S1 & S2 heard, RRR. No JVD. Gastrointestinal system: Abdomen is nondistended, soft and nontender.  Extremities: No pedal edema. Skin: No rashes, lesions or ulcers Psychiatry: Judgement and insight appear normal. Mood & affect appropriate.   Data Reviewed:    Labs: Basic Metabolic Panel: Recent Labs  Lab 05/29/22 2033 05/30/22 0358  NA 138 136  K 4.0 3.4*  CL 96* 96*  CO2 30 28  GLUCOSE 161* 159*  BUN 11 11  CREATININE 2.42* 2.58*  CALCIUM 9.8 9.3  PHOS  --  3.4    GFR Estimated Creatinine Clearance: 24.3 mL/min (A) (by C-G formula based on SCr of 2.58 mg/dL (H)). Liver Function Tests: Recent Labs  Lab 05/29/22 2233 05/30/22 0358  AST 37  --   ALT 35  --   ALKPHOS 99  --   BILITOT 1.2  --   PROT 7.3  --   ALBUMIN 3.4* 3.3*    Recent Labs  Lab 05/29/22 2233  LIPASE 20    No results for input(s): "AMMONIA" in the last 168 hours. Coagulation profile No results for input(s): "INR", "PROTIME" in the last 168 hours. COVID-19 Labs  No results for input(s): "DDIMER", "FERRITIN", "LDH", "CRP" in the last 72 hours.  Lab Results  Component Value Date   SARSCOV2NAA NEGATIVE 03/30/2022   SARSCOV2NAA NEGATIVE 02/14/2022   SARSCOV2NAA NEGATIVE 10/24/2021   [place] NEGATIVE 10/05/2021    CBC: Recent Labs  Lab 05/29/22 2033  WBC 9.2  HGB 14.3  HCT 46.4*  MCV 87.4  PLT 249    Cardiac Enzymes: No results for input(s): "CKTOTAL", "CKMB", "CKMBINDEX", "TROPONINI" in the last 168 hours. BNP (last 3 results) No results for input(s): "PROBNP" in the last 8760 hours. CBG: Recent Labs  Lab 05/31/22 1120 05/31/22 1931 06/01/22 0017 06/01/22 0455 06/01/22 0749  GLUCAP 126* 103* 99  101* 84    D-Dimer: No results for input(s): "DDIMER" in the last 72 hours. Hgb A1c: No results for input(s): "HGBA1C" in the last 72 hours. Lipid Profile: No results for input(s): "CHOL", "HDL", "LDLCALC", "TRIG", "CHOLHDL", "LDLDIRECT" in the last 72 hours. Thyroid function studies: No results for input(s): "TSH", "T4TOTAL", "T3FREE", "THYROIDAB" in the last 72 hours.  Invalid input(s): "FREET3" Anemia work up: No results for input(s): "VITAMINB12", "FOLATE", "FERRITIN", "TIBC", "IRON", "RETICCTPCT" in the last 72 hours. Sepsis Labs: Recent Labs  Lab 05/29/22 2033  WBC 9.2    Microbiology No results found for this or any previous visit (from the past 240 hour(s)).   Medications:    (feeding supplement) PROSource Plus  30 mL Oral BID BM   amLODipine  10 mg Oral Daily   aspirin EC  81 mg Oral Daily   atorvastatin  40 mg Oral Daily   calcium acetate  667 mg Oral TID WC   Chlorhexidine Gluconate Cloth  6 each Topical Daily   Chlorhexidine Gluconate Cloth  6 each Topical Q0600   ezetimibe  10 mg Oral Daily   insulin glargine-yfgn  8 Units Subcutaneous QHS   isosorbide mononitrate  30 mg Oral Daily   metoCLOPramide (REGLAN) injection  5 mg Intravenous Q8H   pantoprazole (PROTONIX) IV  40 mg Intravenous Q12H   polyethylene glycol  17 g Oral BID   Continuous Infusions:    LOS: 2 days   Charlynne Cousins  [facility]  06/01/2022, 9:56 AM

## 2022-06-01 NOTE — Progress Notes (Signed)
  [facility] Progress Note   Subjective:  Seen in room. No dyspnea today. No CP, abd pain. + nausea but no vomiting today. Did fine with dialysis yesterday, 2.6L net UF.   Objective Vitals:   05/31/22 1900 05/31/22 1930 06/01/22 0436 06/01/22 0500  BP:  (!) 177/57 (!) 173/77   Pulse:  89 91   Resp:  18 19   Temp: 98.7 F (37.1 C) 98.2 F (36.8 C) 98.1 F (36.7 C)   TempSrc:   Oral   SpO2:  98% 94%   Weight:    91.8 kg   Physical Exam General: Well appearing woman, NAD. Room air. Heart: RRR; no murmur Lungs: CTAB Abdomen: soft, non-tender Extremities: B BKA Dialysis Access: TDC, maturing RUE AVF + bruit  Additional Objective Labs: Basic Metabolic Panel: Recent Labs  Lab 05/29/22 2033 05/30/22 0358  NA 138 136  K 4.0 3.4*  CL 96* 96*  CO2 30 28  GLUCOSE 161* 159*  BUN 11 11  CREATININE 2.42* 2.58*  CALCIUM 9.8 9.3  PHOS  --  3.4   Liver Function Tests: Recent Labs  Lab 05/29/22 2233 05/30/22 0358  AST 37  --   ALT 35  --   ALKPHOS 99  --   BILITOT 1.2  --   PROT 7.3  --   ALBUMIN 3.4* 3.3*   Recent Labs  Lab 05/29/22 2233  LIPASE 20   CBC: Recent Labs  Lab 05/29/22 2033  WBC 9.2  HGB 14.3  HCT 46.4*  MCV 87.4  PLT 249   CBG: Recent Labs  Lab 05/31/22 1120 05/31/22 1931 06/01/22 0017 06/01/22 0455 06/01/22 0749  GLUCAP 126* 103* 99 101* 84   Medications:   amLODipine  10 mg Oral Daily   aspirin EC  81 mg Oral Daily   atorvastatin  40 mg Oral Daily   calcium acetate  667 mg Oral TID WC   Chlorhexidine Gluconate Cloth  6 each Topical Daily   Chlorhexidine Gluconate Cloth  6 each Topical Q0600   ezetimibe  10 mg Oral Daily   insulin glargine-yfgn  8 Units Subcutaneous QHS   isosorbide mononitrate  30 mg Oral Daily   metoCLOPramide (REGLAN) injection  5 mg Intravenous Q8H   pantoprazole (PROTONIX) IV  40 mg Intravenous Q12H   polyethylene glycol  17 g Oral BID    Dialysis Orders: MWF [facility]  4h 66mn  400/1.5   92kg   2/2 bath  TDC  Hep 5000+ 3000 mid-run prn - missed HD 6/14 - mircera 200 ug q2, last 5/17 - recent UF 0, 0.3, 2.0 - last Hb 10 on 6/07  Assessment/Plan: 1. Chest pain: ACS ruled out, resolved today. 2. Pulm edema: Per imaging, patient largely asymptomatic. S/p extra HD yesterday. UF as tolerated, needs EDW lowered on discharge. 3. Abd pain/nausea: On PPI + Reglan, CT abdomen without acute findings 4. ESRD: Continue HD per usual MWF schedule now -> HD today. 5. HTN/volume: BP remains high, continue to UF as tolerated. 6. Anemia: Hgb > 14 on 6/14, higher than usual -> repeat today, no ESA needs.  7. Secondary hyperparathyroidism: Ca/Phos ok - continue Phoslo as binder. 8. Nutrition: Alb low, adding protein supplements 9. Hx CVA 10. PAD/Hx B BKA 11. T2DM   KVeneta Penton PHershal Coria6/16/2023, 9:10 AM  [employer]

## 2022-06-01 NOTE — TOC Initial Note (Signed)
Transition of Care [facility]) - Initial/Assessment Note    Patient Details  Name: Nancy [name] MRN: [id number] Date of Birth: January 13, 1961  Transition of Care [facility]) CM/SW Contact:    Milinda Antis, LCSWA Phone Number: 06/01/2022, 12:30 PM  Clinical Narrative:                 CSW met with the patient and patient's husband at bedside.  The family is willing to return to [facility] when the patient is medically ready.    CSW contacted [facility] and the facility is requesting an FL2 prior to the patient returning.  CSW contacted MD and requested that PT and OT orders be placed.  Pending:  PT/OT recommendations        Patient Goals and CMS Choice        Expected Discharge Plan and Services                                                Prior Living Arrangements/Services                       Activities of Daily Living Home Assistive Devices/Equipment: None ADL Screening (condition at time of admission) Patient's cognitive ability adequate to safely complete daily activities?: Yes Is the patient deaf or have difficulty hearing?: Yes Does the patient have difficulty seeing, even when wearing glasses/contacts?: No Does the patient have difficulty concentrating, remembering, or making decisions?: Yes Patient able to express need for assistance with ADLs?: Yes Does the patient have difficulty dressing or bathing?: Yes Independently performs ADLs?: No Communication: Independent Dressing (OT): Needs assistance Is this a change from baseline?: Pre-admission baseline Grooming: Needs assistance Is this a change from baseline?: Pre-admission baseline Feeding: Independent Bathing: Needs assistance Is this a change from baseline?: Pre-admission baseline Toileting: Needs assistance Is this a change from baseline?: Pre-admission baseline In/Out Bed: Needs assistance Is this a change from baseline?: Pre-admission baseline Walks in Home: Dependent Is this  a change from baseline?: Pre-admission baseline Does the patient have difficulty walking or climbing stairs?: Yes Weakness of Legs: Both (bka) Weakness of Arms/Hands: Both  Permission Sought/Granted                  Emotional Assessment              Admission diagnosis:  Acute pulmonary edema ([place]) [J81.0] Chest pain [R07.9] Chest pain, unspecified type [R07.9] Hypertension, unspecified type [I10] Patient Active Problem List   Diagnosis Date Noted   Chest pain 05/30/2022   Volume overload 05/30/2022   Acute pulmonary edema ([place]) 05/30/2022   Nausea 04/10/2022   Hyponatremia 04/04/2022   Prolonged QT interval 04/04/2022   Pressure injury of skin 02/19/2022   Renal anasarca 02/14/2022   ESRD on dialysis ([place]) 02/14/2022   Hyperkalemia 02/14/2022   S/P arteriovenous (AV) fistula creation 12/22/2021   Critical limb ischemia of both lower extremities ([place]) 11/22/2021   Subacute osteomyelitis, right ankle and foot ([place])    Peripheral artery disease ([place]) 10/27/2021   Acute exacerbation of congestive heart failure ([place]) 10/24/2021   Acute cardiogenic pulmonary edema ([place]) 10/06/2021   CAD (coronary artery disease) 10/06/2021   Goals of care, counseling/discussion    Gangrene of left foot ([place])    Cellulitis of left foot    Diabetic foot ulcers ([place]) 09/04/2021  CHF (congestive heart failure) ([place]) 07/07/2021   Acute postoperative anemia due to expected blood loss superimposed on anemia of chronic renal insufficiency 06/30/2021   CKD (chronic kidney disease), stage IV (HCC) 06/11/2021   Chronic diastolic CHF (congestive heart failure) ([place]) 06/10/2021   Abnormal nuclear stress test    Dyspnea on exertion 03/07/2021   Orthopnea 02/25/2021   History of cerebrovascular accident (CVA) with residual deficit 05/06/2020   Diabetic peripheral neuropathy ([place]) 04/26/2020   AKI (acute kidney injury) ([place]) 04/20/2020   Generalized weakness 04/20/2020   Dehydration 04/20/2020    Generalized abdominal pain    Pancreatitis, acute 02/29/2020   Cerebrovascular accident (CVA) ([place]) 11/08/2019   Stenosis of right carotid artery 11/08/2019   Mixed diabetic hyperlipidemia associated with type 2 diabetes mellitus ([place]) 11/08/2019   Cortical age-related cataract of both eyes 08/30/2019   Gait abnormality 08/30/2019   Statin declined 08/30/2019   Gastroesophageal reflux disease without esophagitis 04/18/2018   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   Insulin dependent type 2 diabetes mellitus ([place]) 10/29/2017   History of macular degeneration 10/29/2017   Chronic cervical pain 10/29/2016   Myalgia 09/14/2016   Insomnia 09/14/2016   Tinea pedis of both feet 08/20/2016   Macular degeneration 08/17/2016   Low back pain 09/21/2014   Hypertensive urgency 09/21/2014   Diabetic gastroparesis associated with type 2 diabetes mellitus ([place]) 09/14/2014   HTN (hypertension) 09/08/2014   Thyroid nodule 08/17/2014   Morbid obesity ([place]) 08/17/2014   Hypokalemia 08/06/2014   Type II diabetes mellitus with neurological manifestations, uncontrolled 08/04/2014   PCP:  [name], MD Pharmacy:   [place], [place] - [address] [place] [address] Phone: [phone] Fax: [facility] [address] [place] [address] Phone: [phone] Fax: [phone]     Social Determinants of Health (SDOH) Interventions    Readmission Risk Interventions    02/16/2022   11:55 AM 09/21/2021    9:05 AM 03/07/2020    2:36 PM  Readmission Risk Prevention Plan  Transportation Screening Complete Complete Complete  PCP or Specialist Appt within 3-5 Days   Complete  Social Work Consult for [place] Planning/Counseling   Complete  Palliative Care Screening   Not Applicable  Medication Review Press photographer) Complete Complete Complete  PCP or Specialist appointment within 3-5 days of  discharge  Complete   HRI or [place]  Complete   SW Recovery Care/Counseling Consult  Complete   Palliative Care Screening  Complete   [place]  Not Applicable

## 2022-06-01 NOTE — Progress Notes (Signed)
Notified by transport that pt refused to have dialysis today.  Stephania Fragmin, PA-C notified.

## 2022-06-02 DIAGNOSIS — J810 Acute pulmonary edema: Secondary | ICD-10-CM | POA: Diagnosis not present

## 2022-06-02 DIAGNOSIS — I509 Heart failure, unspecified: Secondary | ICD-10-CM | POA: Diagnosis not present

## 2022-06-02 DIAGNOSIS — E8770 Fluid overload, unspecified: Secondary | ICD-10-CM | POA: Diagnosis not present

## 2022-06-02 DIAGNOSIS — N186 End stage renal disease: Secondary | ICD-10-CM | POA: Diagnosis not present

## 2022-06-02 LAB — GLUCOSE, CAPILLARY
Glucose-Capillary: 113 mg/dL — ABNORMAL HIGH (ref 70–99)
Glucose-Capillary: 113 mg/dL — ABNORMAL HIGH (ref 70–99)
Glucose-Capillary: 122 mg/dL — ABNORMAL HIGH (ref 70–99)
Glucose-Capillary: 128 mg/dL — ABNORMAL HIGH (ref 70–99)
Glucose-Capillary: 86 mg/dL (ref 70–99)
Glucose-Capillary: 95 mg/dL (ref 70–99)

## 2022-06-02 MED ORDER — HEPARIN SODIUM (PORCINE) 1000 UNIT/ML DIALYSIS
3000.0000 [IU] | INTRAMUSCULAR | Status: DC | PRN
Start: 2022-06-03 — End: 2022-06-03

## 2022-06-02 MED ORDER — POLYETHYLENE GLYCOL 3350 17 G PO PACK
17.0000 g | PACK | Freq: Two times a day (BID) | ORAL | Status: AC
  Administered 2022-06-02 (×2): 17 g via ORAL
  Filled 2022-06-02 (×2): qty 1

## 2022-06-02 MED ORDER — HEPARIN SODIUM (PORCINE) 1000 UNIT/ML IJ SOLN
INTRAMUSCULAR | Status: AC
  Administered 2022-06-02: 3000 [IU]
  Filled 2022-06-02: qty 7

## 2022-06-02 MED ORDER — PANTOPRAZOLE SODIUM 40 MG PO TBEC
40.0000 mg | DELAYED_RELEASE_TABLET | Freq: Every day | ORAL | Status: DC
  Administered 2022-06-03 – 2022-06-04 (×2): 40 mg via ORAL
  Filled 2022-06-02 (×2): qty 1

## 2022-06-02 MED ORDER — METOCLOPRAMIDE HCL 5 MG/ML IJ SOLN
5.0000 mg | Freq: Two times a day (BID) | INTRAMUSCULAR | Status: DC
Start: 2022-06-02 — End: 2022-06-03
  Administered 2022-06-02: 5 mg via INTRAVENOUS
  Filled 2022-06-02: qty 2

## 2022-06-02 MED ORDER — LABETALOL HCL 300 MG PO TABS
300.0000 mg | ORAL_TABLET | Freq: Two times a day (BID) | ORAL | Status: DC
  Administered 2022-06-02 – 2022-06-04 (×4): 300 mg via ORAL
  Filled 2022-06-02 (×4): qty 1

## 2022-06-02 NOTE — Evaluation (Signed)
Physical Therapy Evaluation Patient Details Name: Nancy [name] MRN: [id number] DOB: [birth year] Today's Date: 06/02/2022  History of Present Illness  61 y/o female presented to ED on 05/29/22 for chest pain with N/V. Admitted for volume overload and acute pulmonary edema. PMH: DM2, CVA, ESRD on HD, PVD s/p B BKA 03/2022, HTN, CHF, neuropathy, anemia  Clinical Impression  Patient admitted with the above. PTA, patient was at [facility] for rehab following B BKA in 03/2022. Patient presents with weakness, impaired balance, decreased activity tolerance, and impaired functional mobility. Patient required minA for bed mobility and modA for lateral scoot transfer towards Nancy [name]. Patient wanting to return to SNF for rehab to continue to get stronger and mobilize better. Patient will benefit from skilled PT services during acute stay to address listed deficits. Recommend SNF at discharge to maximize functional mobility and decreased caregiver burden.        Recommendations for follow up therapy are one component of a multi-disciplinary discharge planning process, led by the attending physician.  Recommendations may be updated based on patient status, additional functional criteria and insurance authorization.  Follow Up Recommendations Skilled nursing-short term rehab (<3 hours/day)    Assistance Recommended at Discharge Frequent or constant Supervision/Assistance  Patient can return home with the following       Equipment Recommendations None recommended by PT  Recommendations for Other Services       Functional Status Assessment Patient has had a recent decline in their functional status and demonstrates the ability to make significant improvements in function in a reasonable and predictable amount of time.     Precautions / Restrictions Precautions Precautions: Fall Precaution Comments: B BKA Restrictions Weight Bearing Restrictions: No      Mobility  Bed Mobility Overal bed mobility: Needs  Assistance Bed Mobility: Supine to Sit, Sit to Supine     Supine to sit: Min assist Sit to supine: Min guard   General bed mobility comments: minA for trunk elevation to come to EOB    Transfers Overall transfer level: Needs assistance Equipment used: None Transfers: Bed to chair/wheelchair/BSC            Lateral/Scoot Transfers: Mod assist General transfer comment: Performed lateral scoot on EOB but required modA to complete effective scooting otherwise patient not making progress along bed. No drop arm recliner in room to transfer to recliner this date    Ambulation/Gait                  Stairs            Wheelchair Mobility    Modified Rankin (Stroke Patients Only)       Balance Overall balance assessment: Needs assistance Sitting-balance support: Bilateral upper extremity supported Sitting balance-Leahy Scale: Fair                                       Pertinent Vitals/Pain Pain Assessment Pain Assessment: Faces Faces Pain Scale: Hurts a little bit Pain Location: B LE Pain Descriptors / Indicators: Discomfort Pain Intervention(s): Monitored during session, Repositioned, Limited activity within patient's tolerance    Home Living Family/patient expects to be discharged to:: Skilled nursing facility                        Prior Function Prior Level of Function : Needs assist  Mobility Comments: reports facility was using hoyer lift to get to w/c; working on sliding board transfers with PT       Hand Dominance        Extremity/Trunk Assessment   Upper Extremity Assessment Upper Extremity Assessment: Generalized weakness    Lower Extremity Assessment Lower Extremity Assessment: Generalized weakness    Cervical / Trunk Assessment Cervical / Trunk Assessment: Kyphotic  Communication   Communication: HOH  Cognition Arousal/Alertness: Awake/alert Behavior During Therapy: WFL for tasks  assessed/performed Overall Cognitive Status: No family/caregiver present to determine baseline cognitive functioning                                 General Comments: seems Nancy [name] for tasks assessed. More than likely baseline        General Comments      Exercises     Assessment/Plan    PT Assessment Patient needs continued PT services  PT Problem List Decreased strength;Decreased activity tolerance;Decreased balance;Decreased mobility       PT Treatment Interventions DME instruction;Functional mobility training;Therapeutic activities;Balance training;Therapeutic exercise;Patient/family education;Wheelchair mobility training    PT Goals (Current goals can be found in the Care Plan section)  Acute Rehab PT Goals Patient Stated Goal: to go back to rehab PT Goal Formulation: With patient Time For Goal Achievement: 06/16/22 Potential to Achieve Goals: Good    Frequency Min 2X/week     Co-evaluation               AM-PAC PT "6 Clicks" Mobility  Outcome Measure Help needed turning from your back to your side while in a flat bed without using bedrails?: A Little Help needed moving from lying on your back to sitting on the side of a flat bed without using bedrails?: A Little Help needed moving to and from a bed to a chair (including a wheelchair)?: A Lot Help needed standing up from a chair using your arms (e.g., wheelchair or bedside chair)?: Total Help needed to walk in [name] room?: Total Help needed climbing 3-5 steps with a railing? : Total 6 Click Score: 11    End of Session   Activity Tolerance: Patient limited by fatigue Patient left: in bed;with call bell/phone within reach Nurse Communication: Mobility status PT Visit Diagnosis: Muscle weakness (generalized) (M62.81);Other abnormalities of gait and mobility (R26.89)    Time: 2585-2778 PT Time Calculation (min) (ACUTE ONLY): 24 min   Charges:   PT Evaluation $PT Eval Moderate Complexity: 1  Mod PT Treatments $Therapeutic Activity: 8-22 mins        Nancy [name] PT, DPT Acute Rehabilitation Services Office [phone]   Nancy [name] 06/02/2022, 1:43 PM

## 2022-06-02 NOTE — Progress Notes (Signed)
2136 Hd terminated early. 2hr15mns completed. Pt became unresponsive with blank stare. Blood returned. Pt became awake/alert/responsive. [id number] Rapid response team at bedside. V/S stable. Pt denies pain/discomfort. Hd cath placed on hep lock using aseptic technique. Dr SJonnie Finnernotified. No new orders. [place] to room.  Total uf removed: 18029m Medication given: hep lock Post hd vs: 103/44 (60) 71 100% 13r/r Post hd weight: 89.6kg

## 2022-06-02 NOTE — Progress Notes (Signed)
[facility] Progress Note   Subjective:  Seen in room. Had "refused" HD yesterday, now realizes that her days were wrong in her head. Dialyzed last on Thursday. Agreeable for HD today, then back to usual MWF schedule next week. Denies CP or dyspnea.  Objective Vitals:   06/01/22 1757 06/01/22 2023 06/02/22 0546 06/02/22 0937  BP: (!) 155/83 (!) 159/66 133/88 (!) 160/72  Pulse: 83 81 82 81  Resp: '17 18 18 17  '$ Temp: 98.7 F (37.1 C) 98.5 F (36.9 C) 98.9 F (37.2 C) 98 F (36.7 C)  TempSrc: Oral Oral Oral   SpO2: 97% 100% 93% 100%  Weight:       Physical Exam General: Well appearing woman, NAD. Room air. Heart: RRR; no murmur Lungs: CTAB Abdomen: soft, non-tender Extremities: B BKA Dialysis Access: TDC, maturing RUE AVF + bruit  Additional Objective Labs: Basic Metabolic Panel: Recent Labs  Lab 05/29/22 2033 05/30/22 0358  NA 138 136  K 4.0 3.4*  CL 96* 96*  CO2 30 28  GLUCOSE 161* 159*  BUN 11 11  CREATININE 2.42* 2.58*  CALCIUM 9.8 9.3  PHOS  --  3.4   Liver Function Tests: Recent Labs  Lab 05/29/22 2233 05/30/22 0358  AST 37  --   ALT 35  --   ALKPHOS 99  --   BILITOT 1.2  --   PROT 7.3  --   ALBUMIN 3.4* 3.3*   Recent Labs  Lab 05/29/22 2233  LIPASE 20   CBC: Recent Labs  Lab 05/29/22 2033  WBC 9.2  HGB 14.3  HCT 46.4*  MCV 87.4  PLT 249   Medications:   (feeding supplement) PROSource Plus  30 mL Oral BID BM   amLODipine  10 mg Oral Daily   aspirin EC  81 mg Oral Daily   atorvastatin  40 mg Oral Daily   calcium acetate  667 mg Oral TID WC   Chlorhexidine Gluconate Cloth  6 each Topical Daily   Chlorhexidine Gluconate Cloth  6 each Topical Q0600   ezetimibe  10 mg Oral Daily   insulin glargine-yfgn  8 Units Subcutaneous QHS   isosorbide mononitrate  60 mg Oral Daily   labetalol  300 mg Oral BID   metoCLOPramide (REGLAN) injection  5 mg Intravenous Q12H   [START ON 06/03/2022] pantoprazole  40 mg Oral Daily    polyethylene glycol  17 g Oral BID    Dialysis Orders: MWF [facility]  4h 5mn  400/1.5  92kg   2/2 bath  TDC  Hep 5000+ 3000 mid-run prn - missed HD 6/14 - mircera 200 ug q2, last 5/17 - recent UF 0, 0.3, 2.0 - last Hb 10 on 6/07   Assessment/Plan: 1. Chest pain: ACS ruled out, resolved. 2. Pulm edema: Per imaging, patient largely asymptomatic. S/p extra HD 6/15, then confused on days and refused yesterday. 3. Abd pain/nausea: On PPI + Reglan, CT abdomen without acute findings 4. ESRD: Usual MWF schedule, was confused on HD days - agreeable for HD today, then back to MWF schedule next week. 5. HTN/volume: BP remains high, continue to UF as tolerated. Needs EDW lowered on discharge. 6. Anemia: Hgb > 14 on 6/14, higher than usual -> repeat today, no ESA needs.  7. Secondary hyperparathyroidism: Ca/Phos ok - continue Phoslo as binder. 8. Nutrition: Alb low, continue protein supplements 9. Hx CVA 10. PAD/Hx B BKA 11. T2DM    KVeneta Penton PHershal Coria6/17/2023, 10:04 AM  [employer]

## 2022-06-02 NOTE — Progress Notes (Signed)
TR[facility] night cross cover note:   I was notified by RN that the patient experienced an episode of diminished responsiveness during her scheduled hemodialysis session this evening around 2130.  This episode lasted for less than 1 minute following which the patient was noted to be alert and oriented x4, without any interval confusion, without any acute complaints, and without evidence of tonic-clonic activity.  No report of acute focal neurologic deficits.  Hemodialysis session was stopped at that point, and the patient returned to her room on 68M.   Per chart review, the patient's systolic blood pressures this afternoon leading up to her HD session were noted to be in the 1-teens to 130's mmHg. During HD, it appears that the patient's blood pressures began to decrease, with nadir of 91/48 associated with episode of diminished responsiveness.   Following discontinuation of hemodialysis session, blood pressure has been independently increasing, without any interval IV fluids, with blood pressure now noted to be 117/52.  Other vital signs at this time notable for the following: Afebrile, heart rate in the 70s, respiratory rate 18, oxygen saturation 100% on room air.  Notable additional history includes h/o HTN, with current antihypertensive medications during this hospitalization noted to include amlodipine, Imdur, and scheduled oral labetalol.  Of these 3 antihypertensives, it appears that the Imdur and labetalol are new relative to her outpatient regimen which appears to have been limited to amlodipine.  She also has a history of insulin-dependent type 2 DM on basal insulin as well as sliding scale coverage during this hospitalization.  CBG checked shortly after episode of diminished responsiveness noted to be 113.  Per chart review, no documentation of any hypoglycemic blood sugars during this hospitalization.   Additional chart review notable for the patient receiving her home Dilaudid 3 mg p.o. at 1929 this  evening.    Overall, patient's transient, self-limited episode of diminished responsiveness lasting for less than 1 minute around 2130 this evening during hemodialysis session appears suggestive of presyncopal/ syncopal episode with suspected contribution from relative systemic hypotension with concomitant blood pressure noted to be 91/48 relative to preceding systolic blood pressures in the 1 teens to 130s mmHg. suspected influence from transient intravascular depletion as a consequence of fluid shift during hemodialysis, which appears consistent with the self-limited nature of the patient's hypotension following discontinuation of HD session, with VS otherwise noted to be stable.  Blood pressure now normotensive, and back to pre-dialysis levels from earlier today.  Potential additional contributions towards this episode of hypotension include potential secondary influences from recent modifications to her antihypertensive regimen, including additions of Imdur and labetalol resulting in diminished compensatory vasoconstrictive/tachycardic response, with potential additional exacerbation stemming from dose of Dilaudid received shortly before HD session.  In the absence of any tonic-clonic activity nor any postictal state, seizures appear less likely.  Additionally, no evidence of acute focal neurologic deficits to suggest acute CVA.  Episode not associated with any chest pain, but will check EKG to further evaluate.  Does not appear septic, while noting the self-limited nature of the patient's hypotension is also inconsistent with a septic picture.   Will continue to closely monitor patient's vital signs, volume status, and follow for result of ekg.    Babs Bertin, DO Hospitalist

## 2022-06-02 NOTE — Progress Notes (Signed)
[facility] PROGRESS NOTE    Progress Note  JANKI DIKE  EYC:[id number] DOB: 03/24/1961 DOA: 05/29/2022 PCP: [name], MD     Brief Narrative:   Nancy [name] is an 61 y.o. female past medical history significant for end-stage renal disease on hemodialysis Monday Wednesday and Friday, insulin-dependent diabetes mellitus type 2, chronic diastolic heart failure, essential hypertension, CVA, discharge in April 2023 for gangrenous foot ulcer bilateral with cellulitis and osteomyelitis, status post BKA bilaterally comes into the ED with acute onset of chest pain nausea and vomiting with a blood pressure of 186/100.  White count of 19 hemoglobin of 14 platelet count of 300 twelve-lead EKG showed no signs of ischemia chest x-ray showed pulmonary edema satting 90% on room air was started on nitro Compazine and IV labetalol.   Assessment/Plan:   Volume overload/  ESRD on dialysis (HCC)/acute pulmonary edema: Chest x-ray showed mild pulmonary edema. Nephrology consulted,. To remove about 1 L. Further management per renal.  Hopefully for HD today 06/02/2022.  Intractable nausea vomiting and abdominal pain chronic Continue IV Reglan and change Protonix to oral.  She has been tolerating her diet no nausea and vomiting over the last 18 hours. Continue MiraLAX for an additional day. Trial of full liquid diet.  Hypertensive urgency: Continue Norvasc and Imdur, go ahead and start her on labetalol increase Imdur  Insulin dependent type 2 diabetes mellitus ([place]) She relates, she has been a diabetic for 4 years Blood glucose relatively controlled continue sliding scale insulin.  CAD: Twelve-lead EKG showed no signs of ischemia cardiac biomarkers have basically remained flat. Continue Lipitor and Zetia and aspirin.  Peripheral vascular disease: Continue current regimen.  History of CVA: At resume aspirin Lipitor and Zetia.   Unstageable left heel ulcer present on  admission: RN Pressure Injury Documentation: Pressure Injury 02/15/22 Heel Left Unstageable - Full thickness tissue loss in which the base of the injury is covered by slough (yellow, tan, gray, green or brown) and/or eschar (tan, brown or black) in the wound bed. (Active)  02/15/22 1856  Location: Heel  Location Orientation: Left  Staging: Unstageable - Full thickness tissue loss in which the base of the injury is covered by slough (yellow, tan, gray, green or brown) and/or eschar (tan, brown or black) in the wound bed.  Wound Description (Comments):   Present on Admission: Yes    Estimated body mass index is 35.85 kg/m as calculated from the following:   Height as of 03/29/22: '5\' 3"'$  (1.6 m).   Weight as of this encounter: 91.8 kg.     DVT prophylaxis: lovenox Family Communication:none Status is: Observation The patient remains OBS appropriate and will d/c before 2 midnights.    Code Status:     Code Status Orders  (From admission, onward)           Start     Ordered   05/30/22 0233  Do not attempt resuscitation (DNR)  Continuous       Question Answer Comment  In the event of cardiac or respiratory ARREST Do not call a "code blue"   In the event of cardiac or respiratory ARREST Do not perform Intubation, CPR, defibrillation or ACLS   In the event of cardiac or respiratory ARREST Use medication by any route, position, wound care, and other measures to relive pain and suffering. May use oxygen, suction and manual treatment of airway obstruction as needed for comfort.      05/30/22 0232  Code Status History     Date Active Date Inactive Code Status Order ID Comments User Context   05/30/2022 0205 05/30/2022 0232 Full Code [id number]  Shela Leff, MD ED   03/29/2022 2207 04/10/2022 2305 DNR [id number]  Shela Leff, MD ED   03/29/2022 2148 03/29/2022 2207 Full Code [id number]  Shela Leff, MD ED   02/14/2022 1748 02/21/2022 1742 Full Code [id number]   Darliss Cheney, MD ED   10/24/2021 2107 11/01/2021 2305 DNR [id number]  Rehman, Areeg N, DO ED   10/06/2021 0419 10/10/2021 1731 Full Code [id number]  Vernelle Emerald, MD ED   09/11/2021 1131 09/21/2021 1816 DNR [id number]  Norberta Keens, PA-C Inpatient   09/04/2021 1613 09/11/2021 1131 Full Code [id number]  Rick Duff, MD ED   07/07/2021 1936 07/23/2021 2303 Full Code [id number]  Tommie Raymond, NP Inpatient   06/10/2021 1708 06/15/2021 1635 Full Code [id number]  Jonnie Finner, DO Inpatient   05/09/2021 [place] 05/12/2021 1720 Full Code [id number]  Harold Hedge, MD ED   04/27/2021 0847 04/27/2021 1712 Full Code [id number]  [name], MD Inpatient   04/20/2020 2315 04/22/2020 1629 Full Code [id number]  Eugenie Filler, MD ED   02/29/2020 1813 03/08/2020 0317 Full Code [id number]  Kayleen Memos, DO ED   07/23/2016 0816 07/26/2016 2124 Full Code [id number]  Rondel Jumbo, PA-C Inpatient   09/07/2014 1800 09/14/2014 1757 Full Code [id number]  Modena Jansky, MD Inpatient   08/23/2014 0236 08/26/2014 2109 Full Code [id number]  Bonnielee Haff, MD Inpatient   08/04/2014 1347 08/08/2014 1810 Full Code [id number]  Rama, Venetia Maxon, MD Inpatient         IV Access:   Peripheral IV   Procedures and diagnostic studies:   No results found.   Medical Consultants:   None.   Subjective:    EMELIA SANDOVAL no complaints tolerating her diet had a bowel movement she would like her diet advanced.  Objective:    Vitals:   06/01/22 1757 06/01/22 2023 06/02/22 0546 06/02/22 0937  BP: (!) 155/83 (!) 159/66 133/88 (!) 160/72  Pulse: 83 81 82 81  Resp: '17 18 18 17  '$ Temp: 98.7 F (37.1 C) 98.5 F (36.9 C) 98.9 F (37.2 C) 98 F (36.7 C)  TempSrc: Oral Oral Oral   SpO2: 97% 100% 93% 100%  Weight:       SpO2: 100 %   Intake/Output Summary (Last 24 hours) at 06/02/2022 0953 Last data filed at 06/02/2022 0310 Gross per 24 hour  Intake 60 ml  Output 250 ml  Net -190 ml    Filed Weights    05/31/22 0500 06/01/22 0500  Weight: 92.2 kg 91.8 kg    Exam: General exam: In no acute distress. Respiratory system: Good air movement and clear to auscultation. Cardiovascular system: S1 & S2 heard, RRR. No JVD. Gastrointestinal system: Abdomen is nondistended, soft and nontender.  Extremities: No pedal edema. Skin: No rashes, lesions or ulcers Psychiatry: Judgement and insight appear normal. Mood & affect appropriate.   Data Reviewed:    Labs: Basic Metabolic Panel: Recent Labs  Lab 05/29/22 2033 05/30/22 0358  NA 138 136  K 4.0 3.4*  CL 96* 96*  CO2 30 28  GLUCOSE 161* 159*  BUN 11 11  CREATININE 2.42* 2.58*  CALCIUM 9.8 9.3  PHOS  --  3.4    GFR Estimated Creatinine Clearance: 24.3 mL/min (A) (by C-G formula based on SCr of 2.58 mg/dL (H)). Liver Function  Tests: Recent Labs  Lab 05/29/22 2233 05/30/22 0358  AST 37  --   ALT 35  --   ALKPHOS 99  --   BILITOT 1.2  --   PROT 7.3  --   ALBUMIN 3.4* 3.3*    Recent Labs  Lab 05/29/22 2233  LIPASE 20    No results for input(s): "AMMONIA" in the last 168 hours. Coagulation profile No results for input(s): "INR", "PROTIME" in the last 168 hours. COVID-19 Labs  No results for input(s): "DDIMER", "FERRITIN", "LDH", "CRP" in the last 72 hours.  Lab Results  Component Value Date   SARSCOV2NAA NEGATIVE 03/30/2022   SARSCOV2NAA NEGATIVE 02/14/2022   SARSCOV2NAA NEGATIVE 10/24/2021   [place] NEGATIVE 10/05/2021    CBC: Recent Labs  Lab 05/29/22 2033  WBC 9.2  HGB 14.3  HCT 46.4*  MCV 87.4  PLT 249    Cardiac Enzymes: No results for input(s): "CKTOTAL", "CKMB", "CKMBINDEX", "TROPONINI" in the last 168 hours. BNP (last 3 results) No results for input(s): "PROBNP" in the last 8760 hours. CBG: Recent Labs  Lab 06/01/22 1618 06/01/22 2023 06/02/22 0025 06/02/22 0448 06/02/22 0743  GLUCAP 130* 133* 113* 95 86    D-Dimer: No results for input(s): "DDIMER" in the last 72 hours. Hgb  A1c: No results for input(s): "HGBA1C" in the last 72 hours. Lipid Profile: No results for input(s): "CHOL", "HDL", "LDLCALC", "TRIG", "CHOLHDL", "LDLDIRECT" in the last 72 hours. Thyroid function studies: No results for input(s): "TSH", "T4TOTAL", "T3FREE", "THYROIDAB" in the last 72 hours.  Invalid input(s): "FREET3" Anemia work up: No results for input(s): "VITAMINB12", "FOLATE", "FERRITIN", "TIBC", "IRON", "RETICCTPCT" in the last 72 hours. Sepsis Labs: Recent Labs  Lab 05/29/22 2033  WBC 9.2    Microbiology No results found for this or any previous visit (from the past 240 hour(s)).   Medications:    (feeding supplement) PROSource Plus  30 mL Oral BID BM   amLODipine  10 mg Oral Daily   aspirin EC  81 mg Oral Daily   atorvastatin  40 mg Oral Daily   calcium acetate  667 mg Oral TID WC   Chlorhexidine Gluconate Cloth  6 each Topical Daily   Chlorhexidine Gluconate Cloth  6 each Topical Q0600   ezetimibe  10 mg Oral Daily   insulin glargine-yfgn  8 Units Subcutaneous QHS   isosorbide mononitrate  60 mg Oral Daily   labetalol  200 mg Oral BID   metoCLOPramide (REGLAN) injection  5 mg Intravenous Q8H   pantoprazole (PROTONIX) IV  40 mg Intravenous Q12H   Continuous Infusions:    LOS: 3 days   Charlynne Cousins  [facility]  06/02/2022, 9:53 AM

## 2022-06-03 DIAGNOSIS — E8770 Fluid overload, unspecified: Secondary | ICD-10-CM | POA: Diagnosis not present

## 2022-06-03 DIAGNOSIS — J810 Acute pulmonary edema: Secondary | ICD-10-CM | POA: Diagnosis not present

## 2022-06-03 DIAGNOSIS — N186 End stage renal disease: Secondary | ICD-10-CM | POA: Diagnosis not present

## 2022-06-03 DIAGNOSIS — I509 Heart failure, unspecified: Secondary | ICD-10-CM | POA: Diagnosis not present

## 2022-06-03 LAB — RENAL FUNCTION PANEL
Albumin: 2.9 g/dL — ABNORMAL LOW (ref 3.5–5.0)
Anion gap: 12 (ref 5–15)
BUN: 25 mg/dL — ABNORMAL HIGH (ref 8–23)
CO2: 26 mmol/L (ref 22–32)
Calcium: 8.4 mg/dL — ABNORMAL LOW (ref 8.9–10.3)
Chloride: 93 mmol/L — ABNORMAL LOW (ref 98–111)
Creatinine, Ser: 3.34 mg/dL — ABNORMAL HIGH (ref 0.44–1.00)
GFR, Estimated: 15 mL/min — ABNORMAL LOW (ref >60)
Glucose, Bld: 117 mg/dL — ABNORMAL HIGH (ref 70–99)
Phosphorus: 3.7 mg/dL (ref 2.5–4.6)
Potassium: 4.4 mmol/L (ref 3.5–5.1)
Sodium: 131 mmol/L — ABNORMAL LOW (ref 135–145)

## 2022-06-03 LAB — GLUCOSE, CAPILLARY
Glucose-Capillary: 120 mg/dL — ABNORMAL HIGH (ref 70–99)
Glucose-Capillary: 117 mg/dL — ABNORMAL HIGH (ref 70–99)
Glucose-Capillary: 146 mg/dL — ABNORMAL HIGH (ref 70–99)
Glucose-Capillary: 192 mg/dL — ABNORMAL HIGH (ref 70–99)

## 2022-06-03 LAB — CBC
HCT: 38 % (ref 36.0–46.0)
Hemoglobin: 11.6 g/dL — ABNORMAL LOW (ref 12.0–15.0)
MCH: 26.9 pg (ref 26.0–34.0)
MCHC: 30.5 g/dL (ref 30.0–36.0)
MCV: 88 fL (ref 80.0–100.0)
Platelets: 237 10*3/uL (ref 150–400)
RBC: 4.32 MIL/uL (ref 3.87–5.11)
RDW: 14.9 % (ref 11.5–15.5)
WBC: 10.2 10*3/uL (ref 4.0–10.5)
nRBC: 0 % (ref 0.0–0.2)

## 2022-06-03 MED ORDER — ISOSORBIDE MONONITRATE ER 30 MG PO TB24
30.0000 mg | ORAL_TABLET | Freq: Every day | ORAL | Status: DC
  Administered 2022-06-04: 30 mg via ORAL
  Filled 2022-06-03: qty 1

## 2022-06-03 MED ORDER — POLYETHYLENE GLYCOL 3350 17 G PO PACK
17.0000 g | PACK | Freq: Two times a day (BID) | ORAL | Status: DC
  Administered 2022-06-03 (×2): 17 g via ORAL
  Filled 2022-06-03 (×3): qty 1

## 2022-06-03 NOTE — Progress Notes (Signed)
  [name] Progress Note   Subjective:  Seen in room - no CP/dyspnea. S/p rapid response overnight with syncopal episode at HD, treatment ended early. She reports this happens occasionally at HD.   Objective Vitals:   06/02/22 2200 06/02/22 2220 06/03/22 0434 06/03/22 0548  BP:  (!) 117/52 (!) 111/56   Pulse:  72 72   Resp:  18 16   Temp:  98.4 F (36.9 C) 98.4 F (36.9 C)   TempSrc:  Oral    SpO2:  100%    Weight: 89.6 kg   88.6 kg   Physical Exam General: Well appearing woman, NAD. Room air. Heart: RRR; no murmur Lungs: CTAB Abdomen: soft, non-tender Extremities: B BKA Dialysis Access: TDC, maturing RUE AVF + bruit  Additional Objective Labs: Basic Metabolic Panel: Recent Labs  Lab 05/29/22 2033 05/30/22 0358  NA 138 136  K 4.0 3.4*  CL 96* 96*  CO2 30 28  GLUCOSE 161* 159*  BUN 11 11  CREATININE 2.42* 2.58*  CALCIUM 9.8 9.3  PHOS  --  3.4   Liver Function Tests: Recent Labs  Lab 05/29/22 2233 05/30/22 0358  AST 37  --   ALT 35  --   ALKPHOS 99  --   BILITOT 1.2  --   PROT 7.3  --   ALBUMIN 3.4* 3.3*   Recent Labs  Lab 05/29/22 2233  LIPASE 20   CBC: Recent Labs  Lab 05/29/22 2033  WBC 9.2  HGB 14.3  HCT 46.4*  MCV 87.4  PLT 249   Medications:   (feeding supplement) PROSource Plus  30 mL Oral BID BM   amLODipine  10 mg Oral Daily   aspirin EC  81 mg Oral Daily   atorvastatin  40 mg Oral Daily   calcium acetate  667 mg Oral TID WC   Chlorhexidine Gluconate Cloth  6 each Topical Daily   Chlorhexidine Gluconate Cloth  6 each Topical Q0600   ezetimibe  10 mg Oral Daily   insulin glargine-yfgn  8 Units Subcutaneous QHS   isosorbide mononitrate  60 mg Oral Daily   labetalol  300 mg Oral BID   metoCLOPramide (REGLAN) injection  5 mg Intravenous Q12H   pantoprazole  40 mg Oral Daily    Dialysis Orders: MWF [facility]  4h 75mn  400/1.5  92kg   2/2 bath  TDC  Hep 5000+ 3000 mid-run prn - missed HD 6/14 - mircera 200 ug q2,  last 5/17 - recent UF 0, 0.3, 2.0 - last Hb 10 on 6/07   Assessment/Plan: 1. Chest pain: ACS ruled out, resolved. 2. Pulm edema: Per imaging, patient largely asymptomatic. 3. Abd pain/nausea: On PPI + Reglan, CT abdomen without acute findings 4. ESRD: Back to usual MWF schedule now - next HD 6/19. 5. HTN/volume: BP much improved. Needs EDW lowered on discharge. Syncopal episode during last HD - got down to 88.6kg, too low -> aiming for new EDW ~ 89-90kg.  6. Anemia: Hgb > 14 on 6/14, higher than usual. Drawing labs today. 7. Secondary hyperparathyroidism: Ca/Phos ok - continue Phoslo as binder. 8. Nutrition: Alb low, continue protein supplements 9. Hx CVA 10. PAD/Hx B BKA 11. T2DM 12. Heel ulcer 13. Dispo: Ok from renal standpoint  KVeneta Penton PHershal Coria6/18/2023, 9:22 AM  [employer]

## 2022-06-03 NOTE — Progress Notes (Addendum)
[facility] PROGRESS NOTE    Progress Note  Nancy [name]  HYQ:[id number] DOB: Jan 02, 1961 DOA: 05/29/2022 PCP: [name], MD     Brief Narrative:   Nancy [name] is an 61 y.o. female past medical history significant for end-stage renal disease on hemodialysis Monday Wednesday and Friday, insulin-dependent diabetes mellitus type 2, chronic diastolic heart failure, essential hypertension, CVA, discharge in April 2023 for gangrenous foot ulcer bilateral with cellulitis and osteomyelitis, status post BKA bilaterally comes into the ED with acute onset of chest pain nausea and vomiting with a blood pressure of 186/100.  White count of 19 hemoglobin of 14 platelet count of 300 twelve-lead EKG showed no signs of ischemia chest x-ray showed pulmonary edema satting 90% on room air was started on nitro Compazine and IV labetalol.   Assessment/Plan:   Volume overload/  ESRD on dialysis (HCC)/acute pulmonary edema: Chest x-ray showed mild pulmonary edema. Nephrology consulted,. Patient is largely asymptomatic now. We will continue hemodialysis per nephrology. She will probably need lower estimated dry weight.  Intractable nausea vomiting and abdominal pain chronic Question is her abdominal pain nausea and vomiting was likely due to being volume overloaded She was given MiraLAX and has had multiple bowel movements will change MiraLAX. Continue Protonix. We will DC Reglan she has been tolerating her diet we will advance her to a renal diet monitor for the next 24 hours.  Hypertensive urgency: Continue Norvasc at current dose and labetalol, will decrease Imdur. Blood pressures well controlled.  Insulin dependent type 2 diabetes mellitus ([place]) She relates, she has been a diabetic for 4 years Blood glucose relatively controlled continue sliding scale insulin.  CAD: Twelve-lead EKG showed no signs of ischemia cardiac biomarkers have basically remained flat. Continue  Lipitor and Zetia and aspirin.  Peripheral vascular disease: Continue current regimen.  History of CVA: At resume aspirin Lipitor and Zetia.   Unstageable left heel ulcer present on admission: RN Pressure Injury Documentation: Pressure Injury 02/15/22 Heel Left Unstageable - Full thickness tissue loss in which the base of the injury is covered by slough (yellow, tan, gray, green or brown) and/or eschar (tan, brown or black) in the wound bed. (Active)  02/15/22 1856  Location: Heel  Location Orientation: Left  Staging: Unstageable - Full thickness tissue loss in which the base of the injury is covered by slough (yellow, tan, gray, green or brown) and/or eschar (tan, brown or black) in the wound bed.  Wound Description (Comments):   Present on Admission: Yes    Estimated body mass index is 34.6 kg/m as calculated from the following:   Height as of 03/29/22: '5\' 3"'$  (1.6 m).   Weight as of this encounter: 88.6 kg.     DVT prophylaxis: lovenox Family Communication:none Status is: Observation The patient remains OBS appropriate and will d/c before 2 midnights.    Code Status:     Code Status Orders  (From admission, onward)           Start     Ordered   05/30/22 0233  Do not attempt resuscitation (DNR)  Continuous       Question Answer Comment  In the event of cardiac or respiratory ARREST Do not call a "code blue"   In the event of cardiac or respiratory ARREST Do not perform Intubation, CPR, defibrillation or ACLS   In the event of cardiac or respiratory ARREST Use medication by any route, position, wound care, and other measures to relive pain and suffering.  May use oxygen, suction and manual treatment of airway obstruction as needed for comfort.      05/30/22 0232           Code Status History     Date Active Date Inactive Code Status Order ID Comments User Context   05/30/2022 0205 05/30/2022 0232 Full Code [id number]  Shela Leff, MD ED   03/29/2022  2207 04/10/2022 2305 DNR [id number]  Shela Leff, MD ED   03/29/2022 2148 03/29/2022 2207 Full Code [id number]  Shela Leff, MD ED   02/14/2022 1748 02/21/2022 1742 Full Code [id number]  Darliss Cheney, MD ED   10/24/2021 2107 11/01/2021 2305 DNR [id number]  Rehman, Areeg N, DO ED   10/06/2021 0419 10/10/2021 1731 Full Code [id number]  Vernelle Emerald, MD ED   09/11/2021 1131 09/21/2021 1816 DNR [id number]  Norberta Keens, PA-C Inpatient   09/04/2021 1613 09/11/2021 1131 Full Code [id number]  Rick Duff, MD ED   07/07/2021 1936 07/23/2021 2303 Full Code [id number]  Tommie Raymond, NP Inpatient   06/10/2021 1708 06/15/2021 1635 Full Code [id number]  Jonnie Finner, DO Inpatient   05/09/2021 [place] 05/12/2021 1720 Full Code [id number]  Harold Hedge, MD ED   04/27/2021 0847 04/27/2021 1712 Full Code [id number]  [name], MD Inpatient   04/20/2020 2315 04/22/2020 1629 Full Code [id number]  Eugenie Filler, MD ED   02/29/2020 1813 03/08/2020 0317 Full Code [id number]  Kayleen Memos, DO ED   07/23/2016 0816 07/26/2016 2124 Full Code [id number]  Rondel Jumbo, PA-C Inpatient   09/07/2014 1800 09/14/2014 1757 Full Code [id number]  Modena Jansky, MD Inpatient   08/23/2014 0236 08/26/2014 2109 Full Code [id number]  Bonnielee Haff, MD Inpatient   08/04/2014 1347 08/08/2014 1810 Full Code [id number]  Rama, Venetia Maxon, MD Inpatient         IV Access:   Peripheral IV   Procedures and diagnostic studies:   No results found.   Medical Consultants:   None.   Subjective:    Nancy [name] relates she had a bowel movement, tolerating her diet well, she is requesting her diet be advanced.  Objective:    Vitals:   06/02/22 2200 06/02/22 2220 06/03/22 0434 06/03/22 0548  BP:  (!) 117/52 (!) 111/56   Pulse:  72 72   Resp:  18 16   Temp:  98.4 F (36.9 C) 98.4 F (36.9 C)   TempSrc:  Oral    SpO2:  100%    Weight: 89.6 kg   88.6 kg   SpO2: 100 %   Intake/Output Summary (Last 24  hours) at 06/03/2022 1029 Last data filed at 06/03/2022 0900 Gross per 24 hour  Intake 420 ml  Output 2000 ml  Net -1580 ml    Filed Weights   06/02/22 1915 06/02/22 2200 06/03/22 0548  Weight: 91.4 kg 89.6 kg 88.6 kg    Exam: General exam: In no acute distress. Respiratory system: Good air movement and clear to auscultation. Cardiovascular system: S1 & S2 heard, RRR. No JVD. Gastrointestinal system: Abdomen is nondistended, soft and nontender.  Extremities: Below the knee amputation bilaterally. Skin: No rashes, lesions or ulcers Psychiatry: Judgement and insight appear normal. Mood & affect appropriate.  Data Reviewed:    Labs: Basic Metabolic Panel: Recent Labs  Lab 05/29/22 2033 05/30/22 0358  NA 138 136  K 4.0 3.4*  CL 96* 96*  CO2 30 28  GLUCOSE 161* 159*  BUN 11 11  CREATININE 2.42*  2.58*  CALCIUM 9.8 9.3  PHOS  --  3.4    GFR Estimated Creatinine Clearance: 23.9 mL/min (A) (by C-G formula based on SCr of 2.58 mg/dL (H)). Liver Function Tests: Recent Labs  Lab 05/29/22 2233 05/30/22 0358  AST 37  --   ALT 35  --   ALKPHOS 99  --   BILITOT 1.2  --   PROT 7.3  --   ALBUMIN 3.4* 3.3*    Recent Labs  Lab 05/29/22 2233  LIPASE 20    No results for input(s): "AMMONIA" in the last 168 hours. Coagulation profile No results for input(s): "INR", "PROTIME" in the last 168 hours. COVID-19 Labs  No results for input(s): "DDIMER", "FERRITIN", "LDH", "CRP" in the last 72 hours.  Lab Results  Component Value Date   SARSCOV2NAA NEGATIVE 03/30/2022   SARSCOV2NAA NEGATIVE 02/14/2022   SARSCOV2NAA NEGATIVE 10/24/2021   [place] NEGATIVE 10/05/2021    CBC: Recent Labs  Lab 05/29/22 2033  WBC 9.2  HGB 14.3  HCT 46.4*  MCV 87.4  PLT 249    Cardiac Enzymes: No results for input(s): "CKTOTAL", "CKMB", "CKMBINDEX", "TROPONINI" in the last 168 hours. BNP (last 3 results) No results for input(s): "PROBNP" in the last 8760 hours. CBG: Recent  Labs  Lab 06/02/22 0743 06/02/22 1140 06/02/22 1631 06/02/22 2221 06/03/22 0730  GLUCAP 86 122* 128* 113* 120*    D-Dimer: No results for input(s): "DDIMER" in the last 72 hours. Hgb A1c: No results for input(s): "HGBA1C" in the last 72 hours. Lipid Profile: No results for input(s): "CHOL", "HDL", "LDLCALC", "TRIG", "CHOLHDL", "LDLDIRECT" in the last 72 hours. Thyroid function studies: No results for input(s): "TSH", "T4TOTAL", "T3FREE", "THYROIDAB" in the last 72 hours.  Invalid input(s): "FREET3" Anemia work up: No results for input(s): "VITAMINB12", "FOLATE", "FERRITIN", "TIBC", "IRON", "RETICCTPCT" in the last 72 hours. Sepsis Labs: Recent Labs  Lab 05/29/22 2033  WBC 9.2    Microbiology No results found for this or any previous visit (from the past 240 hour(s)).   Medications:    (feeding supplement) PROSource Plus  30 mL Oral BID BM   amLODipine  10 mg Oral Daily   aspirin EC  81 mg Oral Daily   atorvastatin  40 mg Oral Daily   calcium acetate  667 mg Oral TID WC   Chlorhexidine Gluconate Cloth  6 each Topical Daily   Chlorhexidine Gluconate Cloth  6 each Topical Q0600   ezetimibe  10 mg Oral Daily   insulin glargine-yfgn  8 Units Subcutaneous QHS   isosorbide mononitrate  60 mg Oral Daily   labetalol  300 mg Oral BID   metoCLOPramide (REGLAN) injection  5 mg Intravenous Q12H   pantoprazole  40 mg Oral Daily   Continuous Infusions:    LOS: 4 days   Charlynne Cousins  [facility]  06/03/2022, 10:29 AM

## 2022-06-03 NOTE — Progress Notes (Signed)
[facility] night cross cover note:  Update: EKG ordered as component of evaluation of episode of diminished responsiveness occurring during hemodialysis session last evening demonstrates sinus rhythm with heart rate 74, normal intervals, nonspecific T wave inversion in leads I and aVL, otherwise without any additional T wave changes, and no evidence of ST changes, including no evidence of ST elevation.    Babs Bertin, DO Hospitalist

## 2022-06-04 DIAGNOSIS — E8770 Fluid overload, unspecified: Secondary | ICD-10-CM | POA: Diagnosis not present

## 2022-06-04 LAB — GLUCOSE, CAPILLARY
Glucose-Capillary: 137 mg/dL — ABNORMAL HIGH (ref 70–99)
Glucose-Capillary: 121 mg/dL — ABNORMAL HIGH (ref 70–99)
Glucose-Capillary: 98 mg/dL (ref 70–99)

## 2022-06-04 MED ORDER — HEPARIN SODIUM (PORCINE) 1000 UNIT/ML IJ SOLN
INTRAMUSCULAR | Status: AC
  Administered 2022-06-04: 3200 [IU] via INTRAVENOUS_CENTRAL
  Filled 2022-06-04: qty 5

## 2022-06-04 MED ORDER — HEPARIN SODIUM (PORCINE) 1000 UNIT/ML DIALYSIS
5000.0000 [IU] | INTRAMUSCULAR | Status: DC | PRN
  Administered 2022-06-04: 5000 [IU] via INTRAVENOUS_CENTRAL

## 2022-06-04 MED ORDER — HEPARIN SODIUM (PORCINE) 1000 UNIT/ML IJ SOLN
INTRAMUSCULAR | Status: AC
  Filled 2022-06-04: qty 4

## 2022-06-04 MED ORDER — LABETALOL HCL 300 MG PO TABS: 300.0000 mg | ORAL_TABLET | Freq: Two times a day (BID) | ORAL | Status: DC

## 2022-06-04 MED ORDER — AMLODIPINE BESYLATE 10 MG PO TABS: 10.0000 mg | ORAL_TABLET | Freq: Every day | ORAL | Status: DC

## 2022-06-04 MED ORDER — ISOSORBIDE MONONITRATE ER 30 MG PO TB24: 30.0000 mg | ORAL_TABLET | Freq: Every day | ORAL | Status: DC

## 2022-06-04 MED ORDER — POLYETHYLENE GLYCOL 3350 17 G PO PACK: 17.0000 g | PACK | Freq: Every day | ORAL | 0 refills | Status: DC

## 2022-06-04 NOTE — TOC Progression Note (Addendum)
Transition of Care [facility]) - Initial/Assessment Note    Patient Details  Name: Nancy [name] MRN: [id number] Date of Birth: [birth year]  Transition of Care [facility]) CM/SW Contact:    Milinda Antis, [place] Phone Number: 06/04/2022, 9:58 AM  Clinical Narrative:                 09:58-  CSW contacted [facility] to inquire about whether the patient can return to the facility today and is awaiting a response.    10:05-  Facility can readmit patient today.  MD and floor RN notified.         Patient Goals and CMS Choice        Expected Discharge Plan and Services                                                Prior Living Arrangements/Services                       Activities of Daily Living Home Assistive Devices/Equipment: None ADL Screening (condition at time of admission) Patient's cognitive ability adequate to safely complete daily activities?: Yes Is the patient deaf or have difficulty hearing?: Yes Does the patient have difficulty seeing, even when wearing glasses/contacts?: No Does the patient have difficulty concentrating, remembering, or making decisions?: Yes Patient able to express need for assistance with ADLs?: Yes Does the patient have difficulty dressing or bathing?: Yes Independently performs ADLs?: No Communication: Independent Dressing (OT): Needs assistance Is this a change from baseline?: Pre-admission baseline Grooming: Needs assistance Is this a change from baseline?: Pre-admission baseline Feeding: Independent Bathing: Needs assistance Is this a change from baseline?: Pre-admission baseline Toileting: Needs assistance Is this a change from baseline?: Pre-admission baseline In/Out Bed: Needs assistance Is this a change from baseline?: Pre-admission baseline Walks in Home: Dependent Is this a change from baseline?: Pre-admission baseline Does the patient have difficulty walking or climbing stairs?: Yes Weakness of Legs: Both  (bka) Weakness of Arms/Hands: Both  Permission Sought/Granted                  Emotional Assessment              Admission diagnosis:  Acute pulmonary edema ([place]) [J81.0] Chest pain [R07.9] Chest pain, unspecified type [R07.9] Hypertension, unspecified type [I10] Patient Active Problem List   Diagnosis Date Noted   Chest pain 05/30/2022   Volume overload 05/30/2022   Acute pulmonary edema ([place]) 05/30/2022   Nausea 04/10/2022   Hyponatremia 04/04/2022   Prolonged QT interval 04/04/2022   Pressure injury of skin 02/19/2022   Renal anasarca 02/14/2022   ESRD on dialysis ([place]) 02/14/2022   Hyperkalemia 02/14/2022   S/P arteriovenous (AV) fistula creation 12/22/2021   Critical limb ischemia of both lower extremities ([place]) 11/22/2021   Subacute osteomyelitis, right ankle and foot ([place])    Peripheral artery disease ([place]) 10/27/2021   Acute exacerbation of congestive heart failure ([place]) 10/24/2021   Acute cardiogenic pulmonary edema ([place]) 10/06/2021   CAD (coronary artery disease) 10/06/2021   Goals of care, counseling/discussion    Gangrene of left foot ([place])    Cellulitis of left foot    Diabetic foot ulcers ([place]) 09/04/2021   CHF (congestive heart failure) ([place]) 07/07/2021   Acute postoperative anemia due to expected blood loss superimposed on anemia of chronic  renal insufficiency 06/30/2021   CKD (chronic kidney disease), stage IV (HCC) 06/11/2021   Chronic diastolic CHF (congestive heart failure) ([place]) 06/10/2021   Abnormal nuclear stress test    Dyspnea on exertion 03/07/2021   Orthopnea 02/25/2021   History of cerebrovascular accident (CVA) with residual deficit 05/06/2020   Diabetic peripheral neuropathy ([place]) 04/26/2020   AKI (acute kidney injury) ([place]) 04/20/2020   Generalized weakness 04/20/2020   Dehydration 04/20/2020   Generalized abdominal pain    Pancreatitis, acute 02/29/2020   Cerebrovascular accident (CVA) ([place]) 11/08/2019   Stenosis of right  carotid artery 11/08/2019   Mixed diabetic hyperlipidemia associated with type 2 diabetes mellitus ([place]) 11/08/2019   Cortical age-related cataract of both eyes 08/30/2019   Gait abnormality 08/30/2019   Statin declined 08/30/2019   Gastroesophageal reflux disease without esophagitis 04/18/2018   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   Insulin dependent type 2 diabetes mellitus ([place]) 10/29/2017   History of macular degeneration 10/29/2017   Chronic cervical pain 10/29/2016   Myalgia 09/14/2016   Insomnia 09/14/2016   Tinea pedis of both feet 08/20/2016   Macular degeneration 08/17/2016   Low back pain 09/21/2014   Hypertensive urgency 09/21/2014   Diabetic gastroparesis associated with type 2 diabetes mellitus ([place]) 09/14/2014   HTN (hypertension) 09/08/2014   Thyroid nodule 08/17/2014   Morbid obesity ([place]) 08/17/2014   Hypokalemia 08/06/2014   Type II diabetes mellitus with neurological manifestations, uncontrolled 08/04/2014   PCP:  [name], MD Pharmacy:   [place], [place] - [address] [place] [address] Phone: [phone] Fax: [place] [address] [place] [address] Phone: [phone] Fax: [phone]     Social Determinants of Health (SDOH) Interventions    Readmission Risk Interventions    02/16/2022   11:55 AM 09/21/2021    9:05 AM 03/07/2020    2:36 PM  Readmission Risk Prevention Plan  Transportation Screening Complete Complete Complete  PCP or Specialist Appt within 3-5 Days   Complete  Social Work Consult for [place] Planning/Counseling   Complete  Palliative Care Screening   Not Applicable  Medication Review Press photographer) Complete Complete Complete  PCP or Specialist appointment within 3-5 days of discharge  Complete   HRI or [place]  Complete   SW Recovery Care/Counseling Consult  Complete   Palliative Care Screening   Complete   [place]  Not Applicable

## 2022-06-04 NOTE — Progress Notes (Signed)
Jeronimo Norma to be discharged [facility] per MD order. Patient verbalized understanding.  Skin clean, dry and intact without evidence of skin break down, no evidence of skin tears noted. IV catheter discontinued intact. Site without signs and symptoms of complications. Dressing and pressure applied. Pt denies pain at the site currently. No complaints noted.  Patient free of lines, drains, and wounds.   Discharge packet assembled. An After Visit Summary (AVS) was printed and given to the EMS personnel. Patient escorted via stretcher and discharged to [facility] via ambulance. Report called to accepting facility; all questions and concerns addressed.   Amaryllis Dyke, RN

## 2022-06-04 NOTE — Progress Notes (Addendum)
[facility] Progress Note   Subjective: Seen on HD via TDC. No C/Os. Running even. For discharge today.   Objective Vitals:   06/04/22 1000 06/04/22 1030 06/04/22 1100 06/04/22 1130  BP: 128/87 (!) 98/42 100/85 (!) 116/58  Pulse: 71 70 70 70  Resp: '15 19 11 11  '$ Temp:      TempSrc:      SpO2: 100% 99% 99% 98%  Weight:       Physical Exam General: Chronically ill appearing female in NAD Heart: S1,S2 No M/R/G SR on monitor.  Lungs: CRAB Anteriorly. Abdomen: NABS, NT, ND Extremities: Bilateral BKA, woody appearing skin. No stump edema.  Dialysis Access: RIJ TDC blood lines connected. R AVF + T/B   Additional Objective Labs: Basic Metabolic Panel: Recent Labs  Lab 05/29/22 2033 05/30/22 0358 06/03/22 1040  NA 138 136 131*  K 4.0 3.4* 4.4  CL 96* 96* 93*  CO2 '30 28 26  '$ GLUCOSE 161* 159* 117*  BUN 11 11 25*  CREATININE 2.42* 2.58* 3.34*  CALCIUM 9.8 9.3 8.4*  PHOS  --  3.4 3.7   Liver Function Tests: Recent Labs  Lab 05/29/22 2233 05/30/22 0358 06/03/22 1040  AST 37  --   --   ALT 35  --   --   ALKPHOS 99  --   --   BILITOT 1.2  --   --   PROT 7.3  --   --   ALBUMIN 3.4* 3.3* 2.9*   Recent Labs  Lab 05/29/22 2233  LIPASE 20   CBC: Recent Labs  Lab 05/29/22 2033 06/03/22 1040  WBC 9.2 10.2  HGB 14.3 11.6*  HCT 46.4* 38.0  MCV 87.4 88.0  PLT 249 237   Blood Culture    Component Value Date/Time   SDES BLOOD LEFT ANTECUBITAL 03/29/2022 1839   SPECREQUEST  03/29/2022 1839    BOTTLES DRAWN AEROBIC ONLY Blood Culture adequate volume   CULT  03/29/2022 1839    NO GROWTH 5 DAYS Performed at [facility], [place] [address], [place], [place] [address]    REPTSTATUS 04/03/2022 FINAL 03/29/2022 1839    Cardiac Enzymes: No results for input(s): "CKTOTAL", "CKMB", "CKMBINDEX", "TROPONINI" in the last 168 hours. CBG: Recent Labs  Lab 06/03/22 0730 06/03/22 1138 06/03/22 1638 06/03/22 2052 06/04/22 0716  GLUCAP 120* 117* 146*  192* 121*   Iron Studies: No results for input(s): "IRON", "TIBC", "TRANSFERRIN", "FERRITIN" in the last 72 hours. '@lablastinr3'$ @ Studies/Results: No results found. Medications:   (feeding supplement) PROSource Plus  30 mL Oral BID BM   amLODipine  10 mg Oral Daily   aspirin EC  81 mg Oral Daily   atorvastatin  40 mg Oral Daily   calcium acetate  667 mg Oral TID WC   Chlorhexidine Gluconate Cloth  6 each Topical Daily   Chlorhexidine Gluconate Cloth  6 each Topical Q0600   ezetimibe  10 mg Oral Daily   heparin sodium (porcine)       heparin sodium (porcine)       insulin glargine-yfgn  8 Units Subcutaneous QHS   isosorbide mononitrate  30 mg Oral Daily   labetalol  300 mg Oral BID   pantoprazole  40 mg Oral Daily   polyethylene glycol  17 g Oral BID     Dialysis Orders: MWF [facility]  4h 74mn  400/1.5  92kg   2/2 bath  TDC   -Heparin  5000 units IV Heparin  3000 units IV mid-run  prn - missed HD 6/14 - mircera 200 ug IV q 2 weeks last 5/17 - last Hb 10 on 6/07   Assessment/Plan: 1. Chest pain: ACS ruled out, resolved. 2. Pulm edema: Per imaging, patient largely asymptomatic. Resolved with UF.  3. Abd pain/nausea: On PPI + Reglan, CT abdomen without acute findings 4. ESRD: Back to usual MWF schedule now - next HD 6/19. 5. HTN/volume: BP much improved. Needs EDW lowered on discharge. Syncopal episode during last HD - got down to 88.6kg, too low -> aiming for new EDW ~ 89-90kg.  6. Anemia: HGB 11.6. Recent OP ESA dose. Follow HGB.  7. Secondary hyperparathyroidism: Ca/Phos ok - continue Phoslo as binder. 8. Nutrition: Alb low, continue protein supplements 9. Hx CVA 10. PAD/Hx B BKA 11. T2DM 13. Dispo: Discharge home today.   Rita H. Brown NP-C 06/04/2022, 11:48 AM  [employer] [phone]    Seen and examined independently earlier today.  Agree with note and exam as documented above by physician extender and as noted here.  See also procedure note from  today.   General adult female in bed in no acute distress HEENT normocephalic atraumatic extraocular movements intact sclera anicteric Neck supple trachea midline Lungs clear to auscultation bilaterally normal work of breathing at rest  Heart S1S2 no rub Abdomen soft nontender nondistended Extremities no edema lower extremity or residual limb  Psych normal mood and affect RIJ tunn catheter in place   ESRD on HD - MWF schedule for HD  Abd pain and nausea - advancing diet per pt and doing better  Syncopal event last HD - tolerated HD today per my exam; UF was decreased after hypotension  Anemia ESRD - no acute indication for ESA  Per chart note plans for D/c to SNF today.   Claudia Desanctis, MD 06/04/2022 3:59 PM

## 2022-06-04 NOTE — Progress Notes (Signed)
D/C order noted. Contacted [facility] to make clinic aware of pt's d/c today and that pt will resume care on Wednesday.   Melven Sartorius Renal Navigator [phone]

## 2022-06-04 NOTE — Discharge Summary (Signed)
Physician Discharge Summary  Nancy [name] VVO:[id number] DOB: 1961-05-08 DOA: 05/29/2022  PCP: [name], MD  Admit date: 05/29/2022 Discharge date: 06/04/2022  Admitted From: SNF Disposition:  SNF  Discharge Condition:Stable CODE STATUS:DNR Diet recommendation: renal diet  Brief/Interim Summary:  Nancy [name] is an 61 y.o. female past medical history significant for end-stage renal disease on hemodialysis Monday Wednesday and Friday, insulin-dependent diabetes mellitus type 2, chronic diastolic heart failure, essential hypertension, CVA, discharge in April 2023 for gangrenous foot ulcer bilateral with cellulitis and osteomyelitis, status post BKA bilaterally comes into the ED with acute onset of chest pain nausea and vomiting with a blood pressure of 186/100.  chest x-ray showed pulmonary edema satting 90% on room air  Patient was emergently started on dialysis, nephrology was following.  Currently she euvolemic, blood pressure medications adjusted.  She is medically stable for discharge back to skilled facility.  Following problems were addressed during his hospitalization:  Volume overload/  ESRD on dialysis (HCC)/acute pulmonary edema: Chest x-ray showed mild pulmonary edema. Nephrology was consulted,. Patient is largely asymptomatic now. We will continue hemodialysis per nephrology.  Intractable nausea vomiting and abdominal pain chronic Question is her abdominal pain nausea and vomiting was likely due to being volume overloaded She was given MiraLAX and has had multiple bowel movements .Continue Protonix. Symptoms resolved   Hypertensive urgency: Continue Norvasc, Imdur, labetalol. Blood pressures well controlled.   Insulin dependent type 2 diabetes mellitus ([place]) Continue home regimen  CAD: Twelve-lead EKG showed no signs of ischemia cardiac biomarkers have basically remained flat. Continue Lipitor and Zetia and aspirin.  Peripheral vascular  disease: Continue current regimen.  History of CVA: At resume aspirin Lipitor and Zetia.     Unstageable left heel ulcer present on admission   Discharge Diagnoses:  Principal Problem:   Volume overload Active Problems:   ESRD on dialysis (HCC)   Insulin dependent type 2 diabetes mellitus ([place])   CAD (coronary artery disease)   Cerebrovascular accident (CVA) ([place])   Acute exacerbation of congestive heart failure ([place])   Chest pain   Acute pulmonary edema [facility])    Discharge Instructions  Discharge Instructions     Diet - low sodium heart healthy   Complete by: As directed    Discharge instructions   Complete by: As directed    1)Please take prescribed medications as instructed   Increase activity slowly   Complete by: As directed       Allergies as of 06/04/2022       Reactions   Hydralazine Hcl Other (See Comments)   Hair loss   Hydrocodone Itching, Other (See Comments)   Upset stomach   Metformin And Related Nausea And Vomiting, Other (See Comments)   Stomach pains, also   Other Nausea Only, Other (See Comments)   Lettuce- Does not digest this!!   Plaquenil [hydroxychloroquine Sulfate] Hives   Shellfish-derived Products Nausea Only, Other (See Comments)   Caused an upset stomach   Shrimp (diagnostic) Nausea Only, Other (See Comments)   Upset stomach    Sulfa Antibiotics Hives        Medication List     TAKE these medications    amLODipine 10 MG tablet Commonly known as: NORVASC Take 1 tablet (10 mg total) by mouth daily. What changed:  medication strength how much to take   Aspirin Low Dose 81 MG tablet Generic drug: aspirin EC Take 1 tablet (81 mg total) by mouth daily.   atorvastatin 40 MG tablet  Commonly known as: LIPITOR Take 1 tablet (40 mg total) by mouth daily.   calcium acetate 667 MG capsule Commonly known as: PHOSLO TAKE 1 CAPSULE (667 MG TOTAL) BY MOUTH 3 (THREE) TIMES DAILY WITH MEALS.   Dexcom G6 Receiver Devi 1 Device  by Does not apply route daily.   Dexcom G6 Sensor Misc 1 packet by Does not apply route daily.   Dexcom G6 Transmitter Misc 1 packet by Does not apply route daily.   ezetimibe 10 MG tablet Commonly known as: ZETIA Take 1 tablet (10 mg total) by mouth daily.   ferrous sulfate 325 (65 FE) MG tablet Take 1 tablet (325 mg total) by mouth daily with breakfast.   glucose blood test strip Use as instructedCheck blood sugar three times daily. E11.40   HYDROmorphone 2 MG tablet Commonly known as: DILAUDID Take 1-1.5 tablets (2-3 mg total) by mouth every 4 (four) hours as needed for severe pain (pain score 7-10).   insulin glargine 100 UNIT/ML Solostar Pen Commonly known as: LANTUS Inject 10 Units into the skin at bedtime. What changed: how much to take   insulin lispro 100 UNIT/ML KwikPen Commonly known as: HumaLOG KwikPen Inject 3 units before breakfast and dinner. What changed:  how much to take how to take this when to take this additional instructions   Insulin Pen Needle 32G X 4 MM Misc Use to inject insulin as directed.   isosorbide mononitrate 30 MG 24 hr tablet Commonly known as: IMDUR Take 1 tablet (30 mg total) by mouth daily.   labetalol 300 MG tablet Commonly known as: NORMODYNE Take 1 tablet (300 mg total) by mouth 2 (two) times daily.   multivitamin Tabs tablet Take 1 tablet by mouth at bedtime.   ondansetron 8 MG tablet Commonly known as: ZOFRAN Take 1 tablet (8 mg total) by mouth every 8 (eight) hours as needed for nausea   OneTouch Verio w/Device Kit Check blood sugar three times daily. E11.40   pantoprazole 40 MG tablet Commonly known as: PROTONIX Take 1 tablet (40 mg total) by mouth daily.   polyethylene glycol 17 g packet Commonly known as: MIRALAX / GLYCOLAX Take 17 g by mouth daily.   promethazine 25 MG tablet Commonly known as: PHENERGAN Take 25-50 mg by mouth every 8 (eight) hours as needed for vomiting or nausea.   zinc sulfate 220  (50 Zn) MG capsule Take 1 capsule (220 mg total) by mouth daily.        Allergies  Allergen Reactions   Hydralazine Hcl Other (See Comments)    Hair loss   Hydrocodone Itching and Other (See Comments)    Upset stomach   Metformin And Related Nausea And Vomiting and Other (See Comments)    Stomach pains, also   Other Nausea Only and Other (See Comments)    Lettuce- Does not digest this!!   Plaquenil [Hydroxychloroquine Sulfate] Hives   Shellfish-Derived Products Nausea Only and Other (See Comments)    Caused an upset stomach   Shrimp (Diagnostic) Nausea Only and Other (See Comments)    Upset stomach    Sulfa Antibiotics Hives    Consultations: Neprhology   Procedures/Studies: CT ABDOMEN PELVIS WO CONTRAST  Result Date: 05/30/2022 CLINICAL DATA:  Abdominal pain. EXAM: CT ABDOMEN AND PELVIS WITHOUT CONTRAST TECHNIQUE: Multidetector CT imaging of the abdomen and pelvis was performed following the standard protocol without IV contrast. RADIATION DOSE REDUCTION: This exam was performed according to the departmental dose-optimization program which includes automated exposure control,  adjustment of the mA and/or kV according to patient size and/or use of iterative reconstruction technique. COMPARISON:  CT abdomen pelvis dated 02/29/2020. FINDINGS: Evaluation of this exam is limited in the absence of intravenous contrast. Lower chest: There are bibasilar linear and streaky atelectasis and scarring. Slight subpleural nodularity of the posterior lung bases likely related to trace pleural effusions. The tip of the dialysis catheter noted in the right atrium. No intra-abdominal free air or free fluid. Hepatobiliary: The liver is unremarkable. No intrahepatic biliary dilatation. Cholecystectomy. No retained calcified stone noted in the central CBD. Pancreas: There is slight prominence of the uncinate process of the pancreas with mild atrophy of the body of the pancreas. Overall decrease in the size  of the tissue in the head and uncinate process of the pancreas compared to prior CT. No significant inflammatory changes. No fluid collection. Spleen: Normal in size without focal abnormality. Adrenals/Urinary Tract: The adrenal glands unremarkable. The kidneys, visualized ureters, and urinary bladder appear unremarkable. Stomach/Bowel: There is moderate amount of stool throughout the colon. There is no bowel obstruction or active inflammation. The appendix is normal. Vascular/Lymphatic: The abdominal aorta and IVC are unremarkable. No portal venous gas. There is no adenopathy. Reproductive: Hysterectomy.  No adnexal masses. Other: There is diffuse subcutaneous edema. Musculoskeletal: No acute or significant osseous findings. IMPRESSION: 1. No acute intra-abdominal or pelvic pathology. 2. Constipation.  No bowel obstruction. Normal appendix. Electronically Signed   By: Anner Crete M.D.   On: 05/30/2022 03:39   DG Chest 2 View  Result Date: 05/29/2022 CLINICAL DATA:  Chest pain EXAM: CHEST - 2 VIEW COMPARISON:  03/18/2022 FINDINGS: Lateral view degraded by patient arm position. Mild right hemidiaphragm elevation. Right IJ dialysis catheter terminates in right atrium. Midline trachea. Borderline cardiomegaly. No pleural effusion or pneumothorax. Mild perihilar interstitial opacities. IMPRESSION: Borderline cardiomegaly with mild perihilar interstitial opacities, favoring pulmonary edema given dialysis catheter. Electronically Signed   By: Abigail Miyamoto M.D.   On: 05/29/2022 21:15      Subjective: Patient seen and examined at the bedside this morning.  Hemodynamically stable for discharge today.  Discharge Exam: Vitals:   06/04/22 0930 06/04/22 1000  BP: (!) 121/59 128/87  Pulse: 69 71  Resp: (!) 8 15  Temp:    SpO2: 99% 100%   Vitals:   06/04/22 0856 06/04/22 0914 06/04/22 0930 06/04/22 1000  BP: (!) 121/54 (!) 102/50 (!) 121/59 128/87  Pulse: 73 71 69 71  Resp:  10 (!) 8 15  Temp: 97.9 F  (36.6 C)     TempSrc: Oral     SpO2: 100% 98% 99% 100%  Weight:        General: Pt is alert, awake, not in acute distress Cardiovascular: RRR, S1/S2 +, no rubs, no gallops Respiratory: CTA bilaterally, no wheezing, no rhonchi Abdominal: Soft, NT, ND, bowel sounds + Extremities: no edema, no cyanosis, bilateral BKA, dialysis access on the right upper extremity    The results of significant diagnostics from this hospitalization (including imaging, microbiology, ancillary and laboratory) are listed below for reference.     Microbiology: No results found for this or any previous visit (from the past 240 hour(s)).   Labs: BNP (last 3 results) Recent Labs    01/26/22 1255 02/14/22 1500 05/30/22 0358  BNP 303.5* 176.4* 882.8*   Basic Metabolic Panel: Recent Labs  Lab 05/29/22 2033 05/30/22 0358 06/03/22 1040  NA 138 136 131*  K 4.0 3.4* 4.4  CL 96* 96* 93*  CO2 '30 28 26  '$ GLUCOSE 161* 159* 117*  BUN 11 11 25*  CREATININE 2.42* 2.58* 3.34*  CALCIUM 9.8 9.3 8.4*  PHOS  --  3.4 3.7   Liver Function Tests: Recent Labs  Lab 05/29/22 2233 05/30/22 0358 06/03/22 1040  AST 37  --   --   ALT 35  --   --   ALKPHOS 99  --   --   BILITOT 1.2  --   --   PROT 7.3  --   --   ALBUMIN 3.4* 3.3* 2.9*   Recent Labs  Lab 05/29/22 2233  LIPASE 20   No results for input(s): "AMMONIA" in the last 168 hours. CBC: Recent Labs  Lab 05/29/22 2033 06/03/22 1040  WBC 9.2 10.2  HGB 14.3 11.6*  HCT 46.4* 38.0  MCV 87.4 88.0  PLT 249 237   Cardiac Enzymes: No results for input(s): "CKTOTAL", "CKMB", "CKMBINDEX", "TROPONINI" in the last 168 hours. BNP: Invalid input(s): "POCBNP" CBG: Recent Labs  Lab 06/03/22 0730 06/03/22 1138 06/03/22 1638 06/03/22 2052 06/04/22 0716  GLUCAP 120* 117* 146* 192* 121*   D-Dimer No results for input(s): "DDIMER" in the last 72 hours. Hgb A1c No results for input(s): "HGBA1C" in the last 72 hours. Lipid Profile No results for  input(s): "CHOL", "HDL", "LDLCALC", "TRIG", "CHOLHDL", "LDLDIRECT" in the last 72 hours. Thyroid function studies No results for input(s): "TSH", "T4TOTAL", "T3FREE", "THYROIDAB" in the last 72 hours.  Invalid input(s): "FREET3" Anemia work up No results for input(s): "VITAMINB12", "FOLATE", "FERRITIN", "TIBC", "IRON", "RETICCTPCT" in the last 72 hours. Urinalysis    Component Value Date/Time   COLORURINE YELLOW 10/06/2021 0138   APPEARANCEUR CLEAR 10/06/2021 0138   LABSPEC 1.009 10/06/2021 0138   PHURINE 5.0 10/06/2021 0138   GLUCOSEU 50 (A) 10/06/2021 0138   HGBUR MODERATE (A) 10/06/2021 0138   BILIRUBINUR NEGATIVE 10/06/2021 0138   BILIRUBINUR negative 12/26/2017 1458   KETONESUR NEGATIVE 10/06/2021 0138   PROTEINUR >=300 (A) 10/06/2021 0138   UROBILINOGEN 0.2 12/26/2017 1458   UROBILINOGEN 0.2 09/07/2014 0857   NITRITE NEGATIVE 10/06/2021 0138   LEUKOCYTESUR NEGATIVE 10/06/2021 0138   Sepsis Labs Recent Labs  Lab 05/29/22 2033 06/03/22 1040  WBC 9.2 10.2   Microbiology No results found for this or any previous visit (from the past 240 hour(s)).  Please note: You were cared for by a hospitalist during your hospital stay. Once you are discharged, your primary care physician will handle any further medical issues. Please note that NO REFILLS for any discharge medications will be authorized once you are discharged, as it is imperative that you return to your primary care physician (or establish a relationship with a primary care physician if you do not have one) for your post hospital discharge needs so that they can reassess your need for medications and monitor your lab values.    Time coordinating discharge: 40 minutes  SIGNED:   Shelly Coss, MD  [facility] 06/04/2022, 10:21 AM Pager [phone]  If 7PM-7AM, please contact night-coverage [email or web address] Password [email or web address]

## 2022-06-04 NOTE — Care Management Important Message (Signed)
Important Message  Patient Details  Name: Nancy [name] MRN: [id number] Date of Birth: May 13, 1961   Medicare Important Message Given:  Yes     Memory Argue 06/04/2022, 2:49 PM

## 2022-06-04 NOTE — Procedures (Signed)
Seen and examined on dialysis.  Blood pressure 121/59 and HR 71.  RIJ tunn catheter in use.  Tolerating goal.  She had a syncopal event last HD and states this happened once as an outpatient as well.  Goal is 1 kg and tolerating currently. Spoke with RN.   Claudia Desanctis, MD 06/04/2022  9:51 AM

## 2022-06-04 NOTE — TOC Transition Note (Signed)
Transition of Care [facility]) - CM/SW Discharge Note   Patient Details  Name: Nancy [name] MRN: [id number] Date of Birth: [birth year]  Transition of Care [facility]) CM/SW Contact:  Milinda Antis, LCSWA Phone Number: 06/04/2022, 2:22 PM   Clinical Narrative:    Patient will DC to: [facility] Anticipated DC date:  06/04/2022 Family notified: Yes Transport by: Corey Harold   Per MD patient ready for DC to SNF. RN to call report prior to discharge [phone] room 105.  [facility] to speak with the [facility].   RN, patient, patient's family, and facility notified of DC. Discharge Summary and FL2 sent to facility. DC packet on chart. Ambulance transport requested for patient.   CSW will sign off for now as social work intervention is no longer needed. Please consult [place] again if new needs arise.     Final next level of care: [place] Barriers to Discharge: Barriers Resolved   Patient Goals and CMS Choice        Discharge Placement              Patient chooses bed at:  [facility]) Patient to be transferred to facility by: [place] Name of family member notified: Ahnya, Akre (Spouse)   [phone] Patient and family notified of of transfer: 06/04/22  Discharge Plan and Services                                     Social Determinants of Health (SDOH) Interventions     Readmission Risk Interventions    02/16/2022   11:55 AM 09/21/2021    9:05 AM 03/07/2020    2:36 PM  Readmission Risk Prevention Plan  Transportation Screening Complete Complete Complete  PCP or Specialist Appt within 3-5 Days   Complete  Social Work Consult for [place] Planning/Counseling   Complete  Palliative Care Screening   Not Applicable  Medication Review [employer]) Complete Complete Complete  PCP or Specialist appointment within 3-5 days of discharge  Complete   HRI or [place]  Complete   SW Recovery Care/Counseling Consult  Complete   Palliative Care  Screening  Complete   [place]  Not Applicable

## 2022-07-09 ENCOUNTER — Other Ambulatory Visit (HOSPITAL_COMMUNITY): Payer: Self-pay

## 2022-07-25 NOTE — Progress Notes (Unsigned)
HISTORY AND PHYSICAL     CC:  dialysis access Requesting Provider:  [name], MD  HPI: This is a 61 y.o. female here for evaluation of her hemodialysis access.  Pt has hx of right 1st stage BVT on 02/19/2022 by Dr. Scot Dock.  She has not been seen back in follow up for this.  She has a TDC that was placed 02/15/2022 by IR.     She denies any pain in her hand on the right.  She does have some numbness but this was present prior to surgery.   During her hospitalization in April, she underwent bilateral BKA by Dr. Sharol Given.    Dialysis access history: -12/22/2021- right BC AVF Dr. Donzetta Matters   The pt is right hand dominant.    Pt is on dialysis.   Days of dialysis if applicable:  M/W/F     HD center if applicable:  [facility].   The pt is on a statin for cholesterol management.  The pt is on a daily aspirin.  Other AC:  none The pt is on CCB, BB for hypertension.  The pt does is diabetic.   Tobacco hx:  former  Past Medical History:  Diagnosis Date   Anemia 2006   Carotid artery disease ([place])    right ICA occlusion, 22-97% LICA 08/8920 [place]   CHF (congestive heart failure) ([place])    Chronic kidney disease    Coronary artery disease    Depression 2014   previously on amitryptiline    Diabetes mellitus with neurological manifestation ([place]) 2006   Diabetic peripheral neuropathy ([place]) 04/26/2020   Dyspnea    Fracture of left ankle 1997   Gastroparesis 07/2016   Heart murmur    HOH (hard of hearing) 2004   Hyperlipidemia 2006   Hypertension 2006   IBS (irritable bowel syndrome) 2002   Leukopenia 2015   Macular degeneration 11/2019   Shingles 2009   Stroke [facility]) 2020   Thyroid nodule 2004    Past Surgical History:  Procedure Laterality Date   ABDOMINAL HYSTERECTOMY  2005   AMPUTATION Bilateral 04/04/2022   Procedure: BILATERAL BELOW KNEE AMPUTATION;  Surgeon: Newt Minion, MD;  Location: [place];  Service: Orthopedics;  Laterality: Bilateral;   AV FISTULA PLACEMENT  Right 12/22/2021   Procedure: RIGHT ARM ARTERIOVENOUS (AV) BRACIOCPHELAIC FISTULA CREATION;  Surgeon: Waynetta Sandy, MD;  Location: [place];  Service: Vascular;  Laterality: Right;   AV FISTULA PLACEMENT Right 02/19/2022   Procedure: RIGHT ARTERIOVENOUS FISTULA;  Surgeon: Angelia Mould, MD;  Location: [place];  Service: Vascular;  Laterality: Right;   CATARACT EXTRACTION Left 11/2019   CESAREAN SECTION  1983    CHOLECYSTECTOMY N/A 03/05/2020   Procedure: LAPAROSCOPIC CHOLECYSTECTOMY WITH INTRAOPERATIVE CHOLANGIOGRAM;  Surgeon: Donnie Mesa, MD;  [facility]: [facility];  Service: General;  Laterality: N/A;   ESOPHAGOGASTRODUODENOSCOPY (EGD) WITH PROPOFOL Left 08/26/2014   Procedure: ESOPHAGOGASTRODUODENOSCOPY (EGD) WITH PROPOFOL;  Surgeon: Arta Silence, MD;  [facility];  Service: Endoscopy;  Laterality: Left;   ESOPHAGOGASTRODUODENOSCOPY (EGD) WITH PROPOFOL N/A 03/03/2020   Procedure: ESOPHAGOGASTRODUODENOSCOPY (EGD) WITH PROPOFOL;  Surgeon: Jerene Bears, MD;  [facility];  Service: Gastroenterology;  Laterality: N/A;   IR FLUORO GUIDE CV LINE RIGHT  02/15/2022   IR US GUIDE VASC ACCESS RIGHT  02/15/2022   RIGHT HEART CATH N/A 07/14/2021   Procedure: RIGHT HEART CATH;  Surgeon: Larey Dresser, MD;  Location: [place] [facility];  Service: Cardiovascular;  Laterality:  N/A;   RIGHT/LEFT HEART CATH AND CORONARY ANGIOGRAPHY N/A 04/27/2021   Procedure: RIGHT/LEFT HEART CATH AND CORONARY ANGIOGRAPHY;  Surgeon: [name], MD;  Location: [place] [facility];  Service: Cardiovascular;  Laterality: N/A;    Allergies  Allergen Reactions   Hydralazine Hcl Other (See Comments)    Hair loss   Hydrocodone Itching and Other (See Comments)    Upset stomach   Metformin And Related Nausea And Vomiting and Other (See Comments)    Stomach pains, also   Other Nausea Only and Other (See Comments)    Lettuce- Does not digest this!!   Plaquenil [Hydroxychloroquine Sulfate] Hives    Shellfish-Derived Products Nausea Only and Other (See Comments)    Caused an upset stomach   Shrimp (Diagnostic) Nausea Only and Other (See Comments)    Upset stomach    Sulfa Antibiotics Hives    Current Outpatient Medications  Medication Sig Dispense Refill   amLODipine (NORVASC) 10 MG tablet Take 1 tablet (10 mg total) by mouth daily.     aspirin 81 MG EC tablet Take 1 tablet (81 mg total) by mouth daily. 60 tablet 1   atorvastatin (LIPITOR) 40 MG tablet Take 1 tablet (40 mg total) by mouth daily. 30 tablet 6   Blood Glucose Monitoring Suppl (ONETOUCH VERIO) w/Device KIT Check blood sugar three times daily. E11.40 (Patient not taking: Reported on 05/29/2022) 1 kit 0   calcium acetate (PHOSLO) 667 MG capsule TAKE 1 CAPSULE (667 MG TOTAL) BY MOUTH 3 (THREE) TIMES DAILY WITH MEALS. 90 capsule 3   Continuous Blood Gluc Receiver (DEXCOM G6 RECEIVER) DEVI 1 Device by Does not apply route daily. 1 each 0   Continuous Blood Gluc Sensor (DEXCOM G6 SENSOR) MISC 1 packet by Does not apply route daily. 3 each 11   Continuous Blood Gluc Transmit (DEXCOM G6 TRANSMITTER) MISC 1 packet by Does not apply route daily. 1 each 4   ezetimibe (ZETIA) 10 MG tablet Take 1 tablet (10 mg total) by mouth daily.     ferrous sulfate 325 (65 FE) MG tablet Take 1 tablet (325 mg total) by mouth daily with breakfast. 100 tablet 1   glucose blood test strip Use as instructedCheck blood sugar three times daily. E11.40 (Patient not taking: Reported on 05/29/2022) 100 each 12   HYDROmorphone (DILAUDID) 2 MG tablet Take 1-1.5 tablets (2-3 mg total) by mouth every 4 (four) hours as needed for severe pain (pain score 7-10). 15 tablet 0   insulin glargine (LANTUS) 100 UNIT/ML Solostar Pen Inject 10 Units into the skin at bedtime. (Patient taking differently: Inject 15 Units into the skin at bedtime.) 15 mL 0   insulin lispro (HUMALOG KWIKPEN) 100 UNIT/ML KwikPen Inject 3 units before breakfast and dinner. (Patient taking  differently: Inject 4 Units into the skin 2 (two) times daily with a meal.) 15 mL 3   Insulin Pen Needle 32G X 4 MM MISC Use to inject insulin as directed. 100 each 4   isosorbide mononitrate (IMDUR) 30 MG 24 hr tablet Take 1 tablet (30 mg total) by mouth daily.     labetalol (NORMODYNE) 300 MG tablet Take 1 tablet (300 mg total) by mouth 2 (two) times daily.     multivitamin (RENA-VIT) TABS tablet Take 1 tablet by mouth at bedtime.  0   ondansetron (ZOFRAN) 8 MG tablet Take 1 tablet (8 mg total) by mouth every 8 (eight) hours as needed for nausea 42 tablet 0   pantoprazole (PROTONIX) 40  MG tablet Take 1 tablet (40 mg total) by mouth daily.     polyethylene glycol (MIRALAX / GLYCOLAX) 17 g packet Take 17 g by mouth daily. 14 each 0   promethazine (PHENERGAN) 25 MG tablet Take 25-50 mg by mouth every 8 (eight) hours as needed for vomiting or nausea.     zinc sulfate 220 (50 Zn) MG capsule Take 1 capsule (220 mg total) by mouth daily.     No current facility-administered medications for this visit.    Family History  Problem Relation Age of Onset   Hypertension Mother    Heart disease Mother    Diabetes Mother    Thyroid disease Mother    Congestive Heart Failure Mother    Breast cancer Maternal Grandmother    Colon cancer Maternal Grandfather    Heart attack Sister    Heart disease Brother    Hyperlipidemia Brother    Hypertension Brother    Diabetes Father    Breast cancer Maternal Aunt     Social History   Socioeconomic History   Marital status: Legally Separated    Spouse name: [name]   Number of children: 1   Years of education: some colle   Highest education level: Not on file  Occupational History   Occupation: Employed FT as Landscape architect    Comment: [employer] , NA  Tobacco Use   Smoking status: Former    Packs/day: 0.25    Years: 0.50    Total pack years: 0.13    Types: Cigarettes   Smokeless tobacco: Never   Tobacco comments:    Former smoker 11/03/21  Vaping  Use   Vaping Use: Never used  Substance and Sexual Activity   Alcohol use: No   Drug use: No   Sexual activity: Yes    Birth control/protection: None  Other Topics Concern   Not on file  Social History Narrative   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    Social Determinants of Health   Financial Resource Strain: Low Risk  (09/28/2021)   Overall Financial Resource Strain (CARDIA)    Difficulty of Paying Living Expenses: Not very hard  Recent Concern: Emergency planning/management officer Strain - High Risk (07/11/2021)   Overall Financial Resource Strain (CARDIA)    Difficulty of Paying Living Expenses: Hard  Food Insecurity: No Food Insecurity (09/28/2021)   Hunger Vital Sign    Worried About Running Out of Food in the Last Year: Never true    Ran Out of Food in the Last Year: Never true  Recent Concern: Food Insecurity - Food Insecurity Present (07/11/2021)   Hunger Vital Sign    Worried About Running Out of Food in the Last Year: Sometimes true    Ran Out of Food in the Last Year: Sometimes true  Transportation Needs: No Transportation Needs (09/28/2021)   PRAPARE - Hydrologist (Medical): No    Lack of Transportation (Non-Medical): No  Physical Activity: Not on file  Stress: Not on file  Social Connections: Not on file  Intimate Partner Violence: Not on file     ROS: _0  Positive   _1  Negative   _2  All sytems reviewed and are negative  Cardiac: _3  CHF   Vascular: _4  hx B AKA   Pulmonary: _5  asthma _6  wheezing  Neurologic: _7  hx CVA/TIA  Hematologic: _8  bleeding problems  GI _9  GERD  GU: _10  CKD/renal failure  _11  HD---_12  M/W/F _13  T/T/S  Psychiatric: _0  hx of major depression  Integumentary: _1  rashes _2  ulcers  Constitutional: _3  fever _4  chills   PHYSICAL EXAMINATION:  Today's Vitals   07/26/22 1016  BP: 136/72  Pulse: 77  Resp: 20  Temp: (!) 97.5 F (36.4 C)  TempSrc: Temporal   SpO2: 99%  Height: _5  (1.6 m)   Body mass index is 35.03 kg/m.    General:  WDWN female in NAD Gait: Not observed HENT: WNL Pulmonary: normal non-labored breathing  Cardiac: regular Skin: without rashes Vascular Exam/Pulses:  +palpable right radial pulse Extremities:  excellent thrill in fistula Musculoskeletal: no muscle wasting or atrophy  Neurologic: A&O X 3; Speech is fluent/normal  Non-Invasive Vascular Imaging:   Dialysis duplex 07/26/2022: +------------+----------+-------------+----------+--------+  OUTFLOW VEINPSV (cm/s)Diameter (cm)Depth (cm)Describe  +------------+----------+-------------+----------+--------+  Prox UA        130        0.67        3.61             +------------+----------+-------------+----------+--------+  Mid UA          79        0.68        2.42             +------------+----------+-------------+----------+--------+  Dist UA        213        0.69        1.41             +------------+----------+-------------+----------+--------+    ASSESSMENT/PLAN: 61 y.o. female with ESRD here for evaluation of her hemodialysis access with hx of 1st stage BVT on 02/19/2022 by Dr. Scot Dock.  [place] placed March 2023 by IR.   -fistula is maturing nicely.  She will need 2nd stage BVT and we will schedule this on a non HD day with Dr. Scot Dock.  I discussed with her that this is a bigger operation than the first.  There is chance of nerve damage, bleeding, injury to vein and needing graft.  Pt agrees to proceed.  -pt is on dialysis -discussed with pt that access does not last forever and will need intervention or even new access at some point.  -pt is right hand dominant -pt is not on anticoagulation   Leontine Locket, [facility] [phone]  Clinic MD:   Scot Dock

## 2022-07-25 NOTE — H&P (View-Only) (Signed)
HISTORY AND PHYSICAL     CC:  dialysis access Requesting Provider:  [name], MD  HPI: This is a 61 y.o. female here for evaluation of her hemodialysis access.  Pt has hx of right 1st stage BVT on 02/19/2022 by Dr. Scot Dock.  She has not been seen back in follow up for this.  She has a TDC that was placed 02/15/2022 by IR.     She denies any pain in her hand on the right.  She does have some numbness but this was present prior to surgery.   During her hospitalization in April, she underwent bilateral BKA by Dr. Sharol Given.    Dialysis access history: -12/22/2021- right BC AVF Dr. Donzetta Matters   The pt is right hand dominant.    Pt is on dialysis.   Days of dialysis if applicable:  M/W/F     HD center if applicable:  [facility].   The pt is on a statin for cholesterol management.  The pt is on a daily aspirin.  Other AC:  none The pt is on CCB, BB for hypertension.  The pt does is diabetic.   Tobacco hx:  former  Past Medical History:  Diagnosis Date   Anemia 2006   Carotid artery disease ([place])    right ICA occlusion, 22-97% LICA 08/8920 [place]   CHF (congestive heart failure) ([place])    Chronic kidney disease    Coronary artery disease    Depression 2014   previously on amitryptiline    Diabetes mellitus with neurological manifestation ([place]) 2006   Diabetic peripheral neuropathy ([place]) 04/26/2020   Dyspnea    Fracture of left ankle 1997   Gastroparesis 07/2016   Heart murmur    HOH (hard of hearing) 2004   Hyperlipidemia 2006   Hypertension 2006   IBS (irritable bowel syndrome) 2002   Leukopenia 2015   Macular degeneration 11/2019   Shingles 2009   Stroke [facility]) 2020   Thyroid nodule 2004    Past Surgical History:  Procedure Laterality Date   ABDOMINAL HYSTERECTOMY  2005   AMPUTATION Bilateral 04/04/2022   Procedure: BILATERAL BELOW KNEE AMPUTATION;  Surgeon: Newt Minion, MD;  Location: [place];  Service: Orthopedics;  Laterality: Bilateral;   AV FISTULA PLACEMENT  Right 12/22/2021   Procedure: RIGHT ARM ARTERIOVENOUS (AV) BRACIOCPHELAIC FISTULA CREATION;  Surgeon: Waynetta Sandy, MD;  Location: [place];  Service: Vascular;  Laterality: Right;   AV FISTULA PLACEMENT Right 02/19/2022   Procedure: RIGHT ARTERIOVENOUS FISTULA;  Surgeon: Angelia Mould, MD;  Location: [place];  Service: Vascular;  Laterality: Right;   CATARACT EXTRACTION Left 11/2019   CESAREAN SECTION  1983    CHOLECYSTECTOMY N/A 03/05/2020   Procedure: LAPAROSCOPIC CHOLECYSTECTOMY WITH INTRAOPERATIVE CHOLANGIOGRAM;  Surgeon: Donnie Mesa, MD;  [facility];  Service: General;  Laterality: N/A;   ESOPHAGOGASTRODUODENOSCOPY (EGD) WITH PROPOFOL Left 08/26/2014   Procedure: ESOPHAGOGASTRODUODENOSCOPY (EGD) WITH PROPOFOL;  Surgeon: Arta Silence, MD;  [facility];  Service: Endoscopy;  Laterality: Left;   ESOPHAGOGASTRODUODENOSCOPY (EGD) WITH PROPOFOL N/A 03/03/2020   Procedure: ESOPHAGOGASTRODUODENOSCOPY (EGD) WITH PROPOFOL;  Surgeon: Jerene Bears, MD;  [facility];  Service: Gastroenterology;  Laterality: N/A;   IR FLUORO GUIDE CV LINE RIGHT  02/15/2022   IR US GUIDE VASC ACCESS RIGHT  02/15/2022   RIGHT HEART CATH N/A 07/14/2021   Procedure: RIGHT HEART CATH;  Surgeon: Larey Dresser, MD;  Location: [place] [facility];  Service: Cardiovascular;  Laterality:  N/A;   RIGHT/LEFT HEART CATH AND CORONARY ANGIOGRAPHY N/A 04/27/2021   Procedure: RIGHT/LEFT HEART CATH AND CORONARY ANGIOGRAPHY;  Surgeon: [name], MD;  Location: [place] [facility];  Service: Cardiovascular;  Laterality: N/A;    Allergies  Allergen Reactions   Hydralazine Hcl Other (See Comments)    Hair loss   Hydrocodone Itching and Other (See Comments)    Upset stomach   Metformin And Related Nausea And Vomiting and Other (See Comments)    Stomach pains, also   Other Nausea Only and Other (See Comments)    Lettuce- Does not digest this!!   Plaquenil [Hydroxychloroquine Sulfate] Hives    Shellfish-Derived Products Nausea Only and Other (See Comments)    Caused an upset stomach   Shrimp (Diagnostic) Nausea Only and Other (See Comments)    Upset stomach    Sulfa Antibiotics Hives    Current Outpatient Medications  Medication Sig Dispense Refill   amLODipine (NORVASC) 10 MG tablet Take 1 tablet (10 mg total) by mouth daily.     aspirin 81 MG EC tablet Take 1 tablet (81 mg total) by mouth daily. 60 tablet 1   atorvastatin (LIPITOR) 40 MG tablet Take 1 tablet (40 mg total) by mouth daily. 30 tablet 6   Blood Glucose Monitoring Suppl (ONETOUCH VERIO) w/Device KIT Check blood sugar three times daily. E11.40 (Patient not taking: Reported on 05/29/2022) 1 kit 0   calcium acetate (PHOSLO) 667 MG capsule TAKE 1 CAPSULE (667 MG TOTAL) BY MOUTH 3 (THREE) TIMES DAILY WITH MEALS. 90 capsule 3   Continuous Blood Gluc Receiver (DEXCOM G6 RECEIVER) DEVI 1 Device by Does not apply route daily. 1 each 0   Continuous Blood Gluc Sensor (DEXCOM G6 SENSOR) MISC 1 packet by Does not apply route daily. 3 each 11   Continuous Blood Gluc Transmit (DEXCOM G6 TRANSMITTER) MISC 1 packet by Does not apply route daily. 1 each 4   ezetimibe (ZETIA) 10 MG tablet Take 1 tablet (10 mg total) by mouth daily.     ferrous sulfate 325 (65 FE) MG tablet Take 1 tablet (325 mg total) by mouth daily with breakfast. 100 tablet 1   glucose blood test strip Use as instructedCheck blood sugar three times daily. E11.40 (Patient not taking: Reported on 05/29/2022) 100 each 12   HYDROmorphone (DILAUDID) 2 MG tablet Take 1-1.5 tablets (2-3 mg total) by mouth every 4 (four) hours as needed for severe pain (pain score 7-10). 15 tablet 0   insulin glargine (LANTUS) 100 UNIT/ML Solostar Pen Inject 10 Units into the skin at bedtime. (Patient taking differently: Inject 15 Units into the skin at bedtime.) 15 mL 0   insulin lispro (HUMALOG KWIKPEN) 100 UNIT/ML KwikPen Inject 3 units before breakfast and dinner. (Patient taking  differently: Inject 4 Units into the skin 2 (two) times daily with a meal.) 15 mL 3   Insulin Pen Needle 32G X 4 MM MISC Use to inject insulin as directed. 100 each 4   isosorbide mononitrate (IMDUR) 30 MG 24 hr tablet Take 1 tablet (30 mg total) by mouth daily.     labetalol (NORMODYNE) 300 MG tablet Take 1 tablet (300 mg total) by mouth 2 (two) times daily.     multivitamin (RENA-VIT) TABS tablet Take 1 tablet by mouth at bedtime.  0   ondansetron (ZOFRAN) 8 MG tablet Take 1 tablet (8 mg total) by mouth every 8 (eight) hours as needed for nausea 42 tablet 0   pantoprazole (PROTONIX) 40  MG tablet Take 1 tablet (40 mg total) by mouth daily.     polyethylene glycol (MIRALAX / GLYCOLAX) 17 g packet Take 17 g by mouth daily. 14 each 0   promethazine (PHENERGAN) 25 MG tablet Take 25-50 mg by mouth every 8 (eight) hours as needed for vomiting or nausea.     zinc sulfate 220 (50 Zn) MG capsule Take 1 capsule (220 mg total) by mouth daily.     No current facility-administered medications for this visit.    Family History  Problem Relation Age of Onset   Hypertension Mother    Heart disease Mother    Diabetes Mother    Thyroid disease Mother    Congestive Heart Failure Mother    Breast cancer Maternal Grandmother    Colon cancer Maternal Grandfather    Heart attack Sister    Heart disease Brother    Hyperlipidemia Brother    Hypertension Brother    Diabetes Father    Breast cancer Maternal Aunt     Social History   Socioeconomic History   Marital status: Legally Separated    Spouse name: [name]   Number of children: 1   Years of education: some colle   Highest education level: Not on file  Occupational History   Occupation: Employed FT as Landscape architect    Comment: [employer] , NA  Tobacco Use   Smoking status: Former    Packs/day: 0.25    Years: 0.50    Total pack years: 0.13    Types: Cigarettes   Smokeless tobacco: Never   Tobacco comments:    Former smoker 11/03/21  Vaping  Use   Vaping Use: Never used  Substance and Sexual Activity   Alcohol use: No   Drug use: No   Sexual activity: Yes    Birth control/protection: None  Other Topics Concern   Not on file  Social History Narrative   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    Social Determinants of Health   Financial Resource Strain: Low Risk  (09/28/2021)   Overall Financial Resource Strain (CARDIA)    Difficulty of Paying Living Expenses: Not very hard  Recent Concern: Emergency planning/management officer Strain - High Risk (07/11/2021)   Overall Financial Resource Strain (CARDIA)    Difficulty of Paying Living Expenses: Hard  Food Insecurity: No Food Insecurity (09/28/2021)   Hunger Vital Sign    Worried About Running Out of Food in the Last Year: Never true    Ran Out of Food in the Last Year: Never true  Recent Concern: Food Insecurity - Food Insecurity Present (07/11/2021)   Hunger Vital Sign    Worried About Running Out of Food in the Last Year: Sometimes true    Ran Out of Food in the Last Year: Sometimes true  Transportation Needs: No Transportation Needs (09/28/2021)   PRAPARE - Hydrologist (Medical): No    Lack of Transportation (Non-Medical): No  Physical Activity: Not on file  Stress: Not on file  Social Connections: Not on file  Intimate Partner Violence: Not on file     ROS: _0  Positive   _1  Negative   _2  All sytems reviewed and are negative  Cardiac: _3  CHF   Vascular: _4  hx B AKA   Pulmonary: _5  asthma _6  wheezing  Neurologic: _7  hx CVA/TIA  Hematologic: _8  bleeding problems  GI _9  GERD  GU: _10  CKD/renal failure  _11  HD---_12  M/W/F _13  T/T/S  Psychiatric: _0  hx of major depression  Integumentary: _1  rashes _2  ulcers  Constitutional: _3  fever _4  chills   PHYSICAL EXAMINATION:  Today's Vitals   07/26/22 1016  BP: 136/72  Pulse: 77  Resp: 20  Temp: (!) 97.5 F (36.4 C)  TempSrc: Temporal   SpO2: 99%  Height: _5  (1.6 m)   Body mass index is 35.03 kg/m.    General:  WDWN female in NAD Gait: Not observed HENT: WNL Pulmonary: normal non-labored breathing  Cardiac: regular Skin: without rashes Vascular Exam/Pulses:  +palpable right radial pulse Extremities:  excellent thrill in fistula Musculoskeletal: no muscle wasting or atrophy  Neurologic: A&O X 3; Speech is fluent/normal  Non-Invasive Vascular Imaging:   Dialysis duplex 07/26/2022: +------------+----------+-------------+----------+--------+  OUTFLOW VEINPSV (cm/s)Diameter (cm)Depth (cm)Describe  +------------+----------+-------------+----------+--------+  Prox UA        130        0.67        3.61             +------------+----------+-------------+----------+--------+  Mid UA          79        0.68        2.42             +------------+----------+-------------+----------+--------+  Dist UA        213        0.69        1.41             +------------+----------+-------------+----------+--------+    ASSESSMENT/PLAN: 61 y.o. female with ESRD here for evaluation of her hemodialysis access with hx of 1st stage BVT on 02/19/2022 by Dr. Scot Dock.  [place] placed March 2023 by IR.   -fistula is maturing nicely.  She will need 2nd stage BVT and we will schedule this on a non HD day with Dr. Scot Dock.  I discussed with her that this is a bigger operation than the first.  There is chance of nerve damage, bleeding, injury to vein and needing graft.  Pt agrees to proceed.  -pt is on dialysis -discussed with pt that access does not last forever and will need intervention or even new access at some point.  -pt is right hand dominant -pt is not on anticoagulation   Leontine Locket, [facility] [phone]  Clinic MD:   Scot Dock

## 2022-07-26 ENCOUNTER — Ambulatory Visit (INDEPENDENT_AMBULATORY_CARE_PROVIDER_SITE_OTHER): Payer: Medicare Other | Admitting: Physician Assistant

## 2022-07-26 ENCOUNTER — Ambulatory Visit (HOSPITAL_COMMUNITY)
Admission: RE | Admit: 2022-07-26 | Discharge: 2022-07-26 | Disposition: A | Discharge status: Home / Self Care | Payer: Medicare Other | Source: Ambulatory Visit | Attending: Vascular Surgery | Admitting: Vascular Surgery

## 2022-07-26 ENCOUNTER — Encounter: Payer: Self-pay | Admitting: Physician Assistant

## 2022-07-26 VITALS — BP 136/72 | HR 77 | Temp 97.5°F | Resp 20 | Ht 63.0 in

## 2022-07-26 DIAGNOSIS — N184 Chronic kidney disease, stage 4 (severe): Secondary | ICD-10-CM | POA: Insufficient documentation

## 2022-07-26 DIAGNOSIS — Z992 Dependence on renal dialysis: Secondary | ICD-10-CM

## 2022-07-26 DIAGNOSIS — I6523 Occlusion and stenosis of bilateral carotid arteries: Secondary | ICD-10-CM

## 2022-07-26 DIAGNOSIS — N186 End stage renal disease: Secondary | ICD-10-CM | POA: Diagnosis not present

## 2022-07-30 ENCOUNTER — Other Ambulatory Visit: Payer: Self-pay

## 2022-07-30 DIAGNOSIS — Z992 Dependence on renal dialysis: Secondary | ICD-10-CM

## 2022-08-06 ENCOUNTER — Encounter (HOSPITAL_COMMUNITY): Payer: Self-pay | Admitting: Vascular Surgery

## 2022-08-06 ENCOUNTER — Other Ambulatory Visit: Payer: Self-pay

## 2022-08-06 NOTE — Progress Notes (Addendum)
Spoke to Amy, Therapist, sports at [employer] regarding instructions for patient   PCP - Sees Dr. Posey Pronto there at [place]  PPM/ICD - Denies Device Orders -  Rep Notified -   Chest x-ray - NI EKG - 06/03/22 Stress Test -  ECHO - 10/06/21 Cardiac Cath - 07/14/2021  Sleep Study - No OSA  DM II  Fasting Blood Sugar -  Checks Blood Sugar ___3-4__ times a day  Aspirin Instructions: May take through day of surgery   Anesthesia review: Yes cardiac history  Amy, RN denies patient of having any shortness of breath, fever, cough and chest pain.  All instructions explained Amy, RN, with a verbal understanding of the material.The opportunity to ask questions was provided.

## 2022-08-06 NOTE — Anesthesia Preprocedure Evaluation (Signed)
Anesthesia Evaluation    Airway        Dental   Pulmonary former smoker,           Cardiovascular hypertension,      Neuro/Psych    GI/Hepatic   Endo/Other  diabetes  Renal/GU      Musculoskeletal   Abdominal   Peds  Hematology   Anesthesia Other Findings   Reproductive/Obstetrics                             Anesthesia Physical Anesthesia Plan  ASA:   Anesthesia Plan:    Post-op Pain Management:    Induction:   PONV Risk Score and Plan:   Airway Management Planned:   Additional Equipment:   Intra-op Plan:   Post-operative Plan:   Informed Consent:   Plan Discussed with:   Anesthesia Plan Comments: (PAT note by Karoline Caldwell, PA-C: Patient resides in a skilled nursing facility.  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  Recent admission 6/13 through 06/04/2022 for volume overload/acute pulmonary edema.  Cardiac biomarkers were essentially flat, EKG showed no signs of ischemia.  She was emergently dialyzed with improvement.  Her dry weight was lowered by nephrology during admission (targeting 89-90 kg).  Patient underwent right first stage PVT on 02/19/2022 by Dr. Scot Dock.  She had a TDC that was placed 02/15/2022 by IR.  Patient will need day of surgery labs and evaluation.  EKG 06/03/2022: Normal sinus rhythm.  Rate 74. Left axis deviation Left anterior fasicular block. Nonspecific T wave abnormality  TTE 10/06/2021: 1. LV function low normal to mildly reduced.  2. Left ventricular ejection fraction, by estimation, is 50 to 55%. The  left ventricle has low normal function. The left ventricle has no regional  wall motion  abnormalities. There is moderate left ventricular hypertrophy.  Left ventricular diastolic  parameters are consistent with Grade II diastolic dysfunction  (pseudonormalization). Elevated left atrial pressure.  3. Right ventricular systolic function is normal. The right ventricular  size is normal.  4. Left atrial size was mildly dilated.  5. The mitral valve is normal in structure. No evidence of mitral valve  regurgitation. No evidence of mitral stenosis.  6. The aortic valve is tricuspid. Aortic valve regurgitation is not  visualized. No aortic stenosis is present.  7. The inferior vena cava is normal in size with greater than 50%  respiratory variability, suggesting right atrial pressure of 3 mmHg.   [place] 07/14/21: 1. Elevated right and left heart filling pressures.  2. Prominent v-wave in PCWP tracing.  3. Primarily pulmonary venous hypertension.  4. Preserved cardiac output.  NM Cardiac Amyloid Scan 07/12/21: IMPRESSION: Visual and quantitative assessment (grade 1, H/CLL equal 1.0) are equivocal suggestive of transthyretin amyloidosis. - Per Dr. Aundra Dubin, "PYP scan not suggestive of TTR cardiac amyloidosis." 7/26/22urine immunofixationunremarkable; 07/11/21 myeloma panel showed elevated IgA of 469 and polyclonal increase detected in one or more immunoglobulins with protein electrophoresis scan to follow.  RHC/LHC 04/27/21 Quay Burow, MD): . Ost LAD to Prox LAD lesion is 40% stenosed. . Mid LAD lesion is 50% stenosed. . Mid LAD to Dist LAD lesion is 40% stenosed. . Hemodynamic findings consistent with pulmonary hypertension. IMPRESSION:Ms. Auvil has pulmonary hypertension with high LVEDP and right atrial  pressure as well as wedge. She has mild to moderate segmental proximal mid and distal LAD disease with out a focal culprit lesion. Is hard to tell whether her Myoview was truly positive or real represented breast attenuation artifact but in any event there is  nothing that requires revascularization. Her circumflex and right coronary artery were free of significant disease. She will need medical therapy for her pulmonary hypertension including diuresis and vasodilators.Marland KitchenMarland KitchenI will arrange for her to see one of the advanced heart failure MDs and the heart failure clinic.  Carotid duplex 03/10/2021: Summary:  Right Carotid: Evidence consistent with a total occlusion of the right ICA. Known mid and distal ICA occlusion.  Left Carotid: Velocities in the left ICA are consistent with a 40-59% stenosis. Essentially stable LICA velocities. Minimum plaque noted; elevated velocities throughout possibly due to contralateral occlusion.  Vertebrals: Bilateral vertebral arteries demonstrate antegrade flow.  Subclavians: Normal flow hemodynamics were seen in bilateral subclavian arteries.   )        Anesthesia Quick Evaluation

## 2022-08-06 NOTE — Progress Notes (Signed)
Anesthesia Chart Review: Same day workup  Patient resides in a skilled nursing facility.  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   Recent admission 6/13 through 06/04/2022 for volume overload/acute pulmonary edema.  Cardiac biomarkers were essentially flat, EKG showed no signs of ischemia.  She was emergently dialyzed with improvement.  Her dry weight was lowered by nephrology during admission (targeting 89-90 kg).  Patient underwent right first stage PVT on 02/19/2022 by Dr. Scot Dock.  She had a TDC that was placed 02/15/2022 by IR.  Patient will need day of surgery labs and evaluation.  EKG 06/03/2022: Normal sinus rhythm.  Rate 74. Left axis deviation Left anterior fasicular block. Nonspecific T wave abnormality  TTE 10/06/2021:  1. LV function low normal to mildly reduced.   2. Left ventricular ejection fraction, by estimation, is 50 to 55%. The  left ventricle has low normal function. The left ventricle has no regional  wall motion abnormalities. There is moderate left ventricular hypertrophy.  Left ventricular diastolic  parameters are consistent with Grade II diastolic dysfunction  (pseudonormalization). Elevated left atrial pressure.   3. Right ventricular systolic function is normal. The right ventricular  size is normal.   4. Left atrial size was mildly dilated.   5. The mitral valve is normal in structure. No evidence of mitral valve  regurgitation. No evidence of mitral stenosis.   6. The aortic valve is tricuspid. Aortic valve regurgitation is not  visualized. No aortic stenosis is present.   7. The inferior vena cava is normal in size with greater than 50%  respiratory variability, suggesting right atrial pressure of 3 mmHg.    [place] 07/14/21: 1. Elevated right and left heart filling pressures.  2. Prominent v-wave in PCWP tracing.  3. Primarily pulmonary venous hypertension.  4. Preserved cardiac output.    NM Cardiac Amyloid Scan 07/12/21: IMPRESSION: Visual and quantitative assessment (grade 1, H/CLL equal 1.0) are equivocal suggestive of transthyretin amyloidosis.  - Per Dr. Aundra Dubin, "PYP scan not suggestive of TTR cardiac amyloidosis." 07/11/21 urine immunofixation unremarkable;  07/11/21 myeloma panel showed elevated IgA of 469 and polyclonal increase detected in one or more immunoglobulins with protein electrophoresis scan to follow.   RHC/LHC 04/27/21 Gwenlyn Found, Roderic Palau, MD): Ost LAD to Prox LAD lesion is 40% stenosed. Mid LAD lesion is 50% stenosed. Mid LAD to Dist LAD lesion is 40% stenosed. Hemodynamic findings consistent with pulmonary hypertension. IMPRESSION: Ms. Goodnow has pulmonary hypertension with high LVEDP and right atrial pressure as well as wedge.  She has mild to moderate segmental proximal mid and distal LAD disease with out a focal culprit lesion.  Is hard to tell whether her Myoview was truly positive or real represented breast attenuation artifact but in any event there is nothing that requires revascularization.  Her circumflex and right coronary artery were free of significant disease.  She will need medical therapy for her pulmonary hypertension including diuresis and vasodilators.Marland KitchenMarland KitchenI will arrange for her to see one of the advanced heart failure MDs and the heart failure clinic.     Carotid duplex 03/10/2021: Summary:  Right Carotid: Evidence consistent with a total occlusion of the right ICA. Known mid and distal ICA occlusion.  Left Carotid: Velocities in the left ICA are consistent with a 40-59% stenosis. Essentially stable LICA velocities. Minimum plaque noted;  elevated velocities throughout possibly due to contralateral occlusion.  Vertebrals:  Bilateral vertebral arteries demonstrate  antegrade flow.  Subclavians: Normal flow hemodynamics were seen in bilateral subclavian arteries.    Wynonia Musty [facility] Short Stay Center/Anesthesiology Phone [phone] 08/06/2022 3:42 PM

## 2022-08-06 NOTE — Progress Notes (Signed)
Patient at [employer].  Tried calling patient directly no answer.  Called back and spoke to a caregiver [place] at [employer]. She asked to call me back in 5 min.  [place]

## 2022-08-07 ENCOUNTER — Ambulatory Visit (HOSPITAL_COMMUNITY)
Admission: RE | Admit: 2022-08-07 | Discharge: 2022-08-07 | Disposition: A | Discharge status: Nursing Home (Includes ICF) | Payer: Medicare Other | Attending: Vascular Surgery | Admitting: Vascular Surgery

## 2022-08-07 ENCOUNTER — Encounter (HOSPITAL_COMMUNITY)
Admission: RE | Disposition: A | Discharge status: Nursing Home (Includes ICF) | Payer: Self-pay | Source: Home / Self Care | Attending: Vascular Surgery

## 2022-08-07 ENCOUNTER — Ambulatory Visit (HOSPITAL_BASED_OUTPATIENT_CLINIC_OR_DEPARTMENT_OTHER): Payer: Medicare Other | Admitting: Physician Assistant

## 2022-08-07 ENCOUNTER — Ambulatory Visit (HOSPITAL_COMMUNITY): Payer: Medicare Other | Admitting: Physician Assistant

## 2022-08-07 ENCOUNTER — Encounter (HOSPITAL_COMMUNITY): Payer: Self-pay | Admitting: Vascular Surgery

## 2022-08-07 DIAGNOSIS — I2510 Atherosclerotic heart disease of native coronary artery without angina pectoris: Secondary | ICD-10-CM | POA: Diagnosis not present

## 2022-08-07 DIAGNOSIS — Z87891 Personal history of nicotine dependence: Secondary | ICD-10-CM

## 2022-08-07 DIAGNOSIS — I509 Heart failure, unspecified: Secondary | ICD-10-CM | POA: Diagnosis not present

## 2022-08-07 DIAGNOSIS — Z992 Dependence on renal dialysis: Secondary | ICD-10-CM | POA: Diagnosis not present

## 2022-08-07 DIAGNOSIS — Z794 Long term (current) use of insulin: Secondary | ICD-10-CM | POA: Diagnosis not present

## 2022-08-07 DIAGNOSIS — N186 End stage renal disease: Secondary | ICD-10-CM

## 2022-08-07 DIAGNOSIS — Z7982 Long term (current) use of aspirin: Secondary | ICD-10-CM | POA: Insufficient documentation

## 2022-08-07 DIAGNOSIS — I132 Hypertensive heart and chronic kidney disease with heart failure and with stage 5 chronic kidney disease, or end stage renal disease: Secondary | ICD-10-CM

## 2022-08-07 DIAGNOSIS — T829XXA Unspecified complication of cardiac and vascular prosthetic device, implant and graft, initial encounter: Secondary | ICD-10-CM | POA: Insufficient documentation

## 2022-08-07 DIAGNOSIS — E1151 Type 2 diabetes mellitus with diabetic peripheral angiopathy without gangrene: Secondary | ICD-10-CM | POA: Diagnosis not present

## 2022-08-07 DIAGNOSIS — E1122 Type 2 diabetes mellitus with diabetic chronic kidney disease: Secondary | ICD-10-CM | POA: Diagnosis not present

## 2022-08-07 DIAGNOSIS — N185 Chronic kidney disease, stage 5: Secondary | ICD-10-CM | POA: Diagnosis not present

## 2022-08-07 HISTORY — DX: Angina pectoris, unspecified: I20.9

## 2022-08-07 HISTORY — PX: BASCILIC VEIN TRANSPOSITION: SHX5742

## 2022-08-07 HISTORY — DX: Dysphagia, unspecified: R13.10

## 2022-08-07 HISTORY — DX: Peripheral vascular disease, unspecified: I73.9

## 2022-08-07 LAB — POCT I-STAT, CHEM 8
BUN: 25 mg/dL — ABNORMAL HIGH (ref 8–23)
Calcium, Ion: 1.2 mmol/L (ref 1.15–1.40)
Chloride: 94 mmol/L — ABNORMAL LOW (ref 98–111)
Creatinine, Ser: 3.4 mg/dL — ABNORMAL HIGH (ref 0.44–1.00)
Glucose, Bld: 116 mg/dL — ABNORMAL HIGH (ref 70–99)
HCT: 24 % — ABNORMAL LOW (ref 36.0–46.0)
Hemoglobin: 8.2 g/dL — ABNORMAL LOW (ref 12.0–15.0)
Potassium: 5.2 mmol/L — ABNORMAL HIGH (ref 3.5–5.1)
Sodium: 137 mmol/L (ref 135–145)
TCO2: 32 mmol/L (ref 22–32)

## 2022-08-07 LAB — GLUCOSE, CAPILLARY
Glucose-Capillary: 127 mg/dL — ABNORMAL HIGH (ref 70–99)
Glucose-Capillary: 100 mg/dL — ABNORMAL HIGH (ref 70–99)
Glucose-Capillary: 155 mg/dL — ABNORMAL HIGH (ref 70–99)

## 2022-08-07 SURGERY — TRANSPOSITION, VEIN, BASILIC
Anesthesia: General | Site: Arm Upper | Laterality: Right

## 2022-08-07 MED ORDER — PROPOFOL 10 MG/ML IV BOLUS
INTRAVENOUS | Status: DC | PRN
  Administered 2022-08-07: 100 mg via INTRAVENOUS

## 2022-08-07 MED ORDER — ONDANSETRON HCL 4 MG/2ML IJ SOLN
INTRAMUSCULAR | Status: DC | PRN
  Administered 2022-08-07: 4 mg via INTRAVENOUS

## 2022-08-07 MED ORDER — PROTAMINE SULFATE 10 MG/ML IV SOLN
INTRAVENOUS | Status: AC
  Filled 2022-08-07: qty 5

## 2022-08-07 MED ORDER — FENTANYL CITRATE (PF) 250 MCG/5ML IJ SOLN
INTRAMUSCULAR | Status: AC
  Filled 2022-08-07: qty 5

## 2022-08-07 MED ORDER — LIDOCAINE-EPINEPHRINE (PF) 1 %-1:200000 IJ SOLN
INTRAMUSCULAR | Status: DC | PRN
  Administered 2022-08-07: 30 mL

## 2022-08-07 MED ORDER — CHLORHEXIDINE GLUCONATE 4 % EX LIQD: 60.0000 mL | Freq: Once | CUTANEOUS | Status: DC

## 2022-08-07 MED ORDER — HEPARIN 6000 UNIT IRRIGATION SOLUTION
Status: DC | PRN
  Administered 2022-08-07: 1

## 2022-08-07 MED ORDER — LIDOCAINE-EPINEPHRINE (PF) 1 %-1:200000 IJ SOLN
INTRAMUSCULAR | Status: AC
  Filled 2022-08-07: qty 30

## 2022-08-07 MED ORDER — OXYCODONE-ACETAMINOPHEN 5-325 MG PO TABS: 1.0000 | ORAL_TABLET | Freq: Four times a day (QID) | ORAL | 0 refills | Status: DC | PRN

## 2022-08-07 MED ORDER — FENTANYL CITRATE (PF) 100 MCG/2ML IJ SOLN: 25.0000 ug | INTRAMUSCULAR | Status: DC | PRN

## 2022-08-07 MED ORDER — INSULIN ASPART 100 UNIT/ML IJ SOLN: 0.0000 [IU] | INTRAMUSCULAR | Status: DC | PRN

## 2022-08-07 MED ORDER — ONDANSETRON HCL 4 MG/2ML IJ SOLN
INTRAMUSCULAR | Status: AC
  Filled 2022-08-07: qty 2

## 2022-08-07 MED ORDER — AMISULPRIDE (ANTIEMETIC) 5 MG/2ML IV SOLN
10.0000 mg | Freq: Once | INTRAVENOUS | Status: AC
Start: 2022-08-07 — End: 2022-08-07
  Administered 2022-08-07: 10 mg via INTRAVENOUS

## 2022-08-07 MED ORDER — 0.9 % SODIUM CHLORIDE (POUR BTL) OPTIME
TOPICAL | Status: DC | PRN
  Administered 2022-08-07: 1000 mL

## 2022-08-07 MED ORDER — ACETAMINOPHEN 500 MG PO TABS
1000.0000 mg | ORAL_TABLET | Freq: Once | ORAL | Status: AC
  Administered 2022-08-07: 1000 mg via ORAL
  Filled 2022-08-07: qty 2

## 2022-08-07 MED ORDER — HEPARIN SODIUM (PORCINE) 1000 UNIT/ML IJ SOLN
1600.0000 [IU] | Freq: Once | INTRAMUSCULAR | Status: AC
  Administered 2022-08-07: 1600 [IU]

## 2022-08-07 MED ORDER — OXYCODONE HCL 5 MG PO TABS: 5.0000 mg | ORAL_TABLET | Freq: Once | ORAL | Status: DC | PRN

## 2022-08-07 MED ORDER — LIDOCAINE 2% (20 MG/ML) 5 ML SYRINGE
INTRAMUSCULAR | Status: AC
  Filled 2022-08-07: qty 5

## 2022-08-07 MED ORDER — OXYCODONE HCL 5 MG/5ML PO SOLN: 5.0000 mg | Freq: Once | ORAL | Status: DC | PRN

## 2022-08-07 MED ORDER — PROPOFOL 10 MG/ML IV BOLUS
INTRAVENOUS | Status: AC
  Filled 2022-08-07: qty 20

## 2022-08-07 MED ORDER — EPHEDRINE 5 MG/ML INJ
INTRAVENOUS | Status: AC
  Filled 2022-08-07: qty 5

## 2022-08-07 MED ORDER — CEFAZOLIN SODIUM-DEXTROSE 2-4 GM/100ML-% IV SOLN
2.0000 g | INTRAVENOUS | Status: AC
  Administered 2022-08-07: 2 g via INTRAVENOUS

## 2022-08-07 MED ORDER — ONDANSETRON HCL 4 MG/2ML IJ SOLN: 4.0000 mg | Freq: Once | INTRAMUSCULAR | Status: DC | PRN

## 2022-08-07 MED ORDER — LIDOCAINE 2% (20 MG/ML) 5 ML SYRINGE
INTRAMUSCULAR | Status: DC | PRN
  Administered 2022-08-07: 40 mg via INTRAVENOUS

## 2022-08-07 MED ORDER — AMISULPRIDE (ANTIEMETIC) 5 MG/2ML IV SOLN
INTRAVENOUS | Status: AC
  Filled 2022-08-07: qty 4

## 2022-08-07 MED ORDER — PHENYLEPHRINE 80 MCG/ML (10ML) SYRINGE FOR IV PUSH (FOR BLOOD PRESSURE SUPPORT)
PREFILLED_SYRINGE | INTRAVENOUS | Status: DC | PRN
  Administered 2022-08-07 (×3): 80 ug via INTRAVENOUS

## 2022-08-07 MED ORDER — GLYCOPYRROLATE PF 0.2 MG/ML IJ SOSY
PREFILLED_SYRINGE | INTRAMUSCULAR | Status: AC
  Filled 2022-08-07: qty 2

## 2022-08-07 MED ORDER — HEPARIN SODIUM (PORCINE) 1000 UNIT/ML IJ SOLN
INTRAMUSCULAR | Status: DC | PRN
  Administered 2022-08-07: 9000 [IU] via INTRAVENOUS

## 2022-08-07 MED ORDER — DEXAMETHASONE SODIUM PHOSPHATE 10 MG/ML IJ SOLN
INTRAMUSCULAR | Status: DC | PRN
  Administered 2022-08-07: 5 mg via INTRAVENOUS

## 2022-08-07 MED ORDER — SODIUM CHLORIDE 0.9 % IV SOLN: INTRAVENOUS | Status: DC

## 2022-08-07 MED ORDER — CEFAZOLIN SODIUM-DEXTROSE 2-4 GM/100ML-% IV SOLN
INTRAVENOUS | Status: AC
  Filled 2022-08-07: qty 100

## 2022-08-07 MED ORDER — PHENYLEPHRINE 80 MCG/ML (10ML) SYRINGE FOR IV PUSH (FOR BLOOD PRESSURE SUPPORT)
PREFILLED_SYRINGE | INTRAVENOUS | Status: AC
  Filled 2022-08-07: qty 20

## 2022-08-07 MED ORDER — ROCURONIUM BROMIDE 10 MG/ML (PF) SYRINGE
PREFILLED_SYRINGE | INTRAVENOUS | Status: AC
Start: 2022-08-07 — End: ?
  Filled 2022-08-07: qty 10

## 2022-08-07 MED ORDER — CHLORHEXIDINE GLUCONATE 0.12 % MT SOLN
OROMUCOSAL | Status: AC
  Administered 2022-08-07: 15 mL via OROMUCOSAL
  Filled 2022-08-07: qty 15

## 2022-08-07 MED ORDER — HEPARIN 6000 UNIT IRRIGATION SOLUTION
Status: AC
  Filled 2022-08-07: qty 500

## 2022-08-07 MED ORDER — CHLORHEXIDINE GLUCONATE 0.12 % MT SOLN
15.0000 mL | OROMUCOSAL | Status: AC
  Filled 2022-08-07: qty 15

## 2022-08-07 MED ORDER — PROTAMINE SULFATE 10 MG/ML IV SOLN
INTRAVENOUS | Status: DC | PRN
  Administered 2022-08-07: 20 mg via INTRAVENOUS
  Administered 2022-08-07: 10 mg via INTRAVENOUS
  Administered 2022-08-07: 20 mg via INTRAVENOUS

## 2022-08-07 MED ORDER — FENTANYL CITRATE (PF) 250 MCG/5ML IJ SOLN
INTRAMUSCULAR | Status: DC | PRN
  Administered 2022-08-07 (×2): 25 ug via INTRAVENOUS
  Administered 2022-08-07: 50 ug via INTRAVENOUS

## 2022-08-07 SURGICAL SUPPLY — 31 items
ARMBAND PINK RESTRICT EXTREMIT (MISCELLANEOUS) ×1 IMPLANT
BAG COUNTER SPONGE SURGICOUNT (BAG) ×1 IMPLANT
CANISTER SUCT 3000ML PPV (MISCELLANEOUS) ×1 IMPLANT
CANNULA VESSEL 3MM 2 BLNT TIP (CANNULA) ×1 IMPLANT
CLIP VESOCCLUDE MED 24/CT (CLIP) ×1 IMPLANT
CLIP VESOCCLUDE SM WIDE 24/CT (CLIP) ×1 IMPLANT
DERMABOND ADVANCED (GAUZE/BANDAGES/DRESSINGS) ×3
DERMABOND ADVANCED .7 DNX12 (GAUZE/BANDAGES/DRESSINGS) ×1 IMPLANT
ELECT REM PT RETURN 9FT ADLT (ELECTROSURGICAL) ×1
ELECTRODE REM PT RTRN 9FT ADLT (ELECTROSURGICAL) ×1 IMPLANT
GLOVE BIO SURGEON STRL SZ7.5 (GLOVE) ×1 IMPLANT
GLOVE BIOGEL PI IND STRL 8 (GLOVE) ×1 IMPLANT
GLOVE BIOGEL PI INDICATOR 8 (GLOVE) ×1
GLOVE SURG UNDER LTX SZ8 (GLOVE) ×1 IMPLANT
GOWN STRL REUS W/ TWL LRG LVL3 (GOWN DISPOSABLE) ×3 IMPLANT
GOWN STRL REUS W/TWL LRG LVL3 (GOWN DISPOSABLE) ×3
KIT BASIN OR (CUSTOM PROCEDURE TRAY) ×1 IMPLANT
KIT TURNOVER KIT B (KITS) ×1 IMPLANT
NS IRRIG 1000ML POUR BTL (IV SOLUTION) ×1 IMPLANT
PACK CV ACCESS (CUSTOM PROCEDURE TRAY) ×1 IMPLANT
PAD ARMBOARD 7.5X6 YLW CONV (MISCELLANEOUS) ×2 IMPLANT
SUT MNCRL AB 4-0 PS2 18 (SUTURE) ×2 IMPLANT
SUT PROLENE 5 0 C 1 24 (SUTURE) IMPLANT
SUT PROLENE 6 0 BV (SUTURE) ×2 IMPLANT
SUT SILK 2 0 SH (SUTURE) IMPLANT
SUT VIC AB 3-0 SH 27 (SUTURE) ×1
SUT VIC AB 3-0 SH 27X BRD (SUTURE) ×2 IMPLANT
TAPE UMBILICAL 1/8X30 (MISCELLANEOUS) IMPLANT
TOWEL GREEN STERILE (TOWEL DISPOSABLE) ×1 IMPLANT
UNDERPAD 30X36 HEAVY ABSORB (UNDERPADS AND DIAPERS) ×1 IMPLANT
WATER STERILE IRR 1000ML POUR (IV SOLUTION) ×1 IMPLANT

## 2022-08-07 NOTE — Anesthesia Procedure Notes (Signed)
Procedure Name: LMA Insertion Date/Time: 08/07/2022 11:40 AM  Performed by: Mariea Clonts, CRNAPre-anesthesia Checklist: Patient identified, Emergency Drugs available, Suction available and Patient being monitored Patient Re-evaluated:Patient Re-evaluated prior to induction Oxygen Delivery Method: Circle System Utilized Preoxygenation: Pre-oxygenation with 100% oxygen Induction Type: IV induction Ventilation: Mask ventilation without difficulty LMA: LMA inserted LMA Size: 4.0 Number of attempts: 1 Airway Equipment and Method: Bite block Placement Confirmation: positive ETCO2 Tube secured with: Tape Dental Injury: Teeth and Oropharynx as per pre-operative assessment

## 2022-08-07 NOTE — Anesthesia Preprocedure Evaluation (Addendum)
Anesthesia Evaluation  Patient identified by MRN, date of birth, ID band Patient awake    Reviewed: Allergy & Precautions, NPO status , Patient's Chart, lab work & pertinent test results, reviewed documented beta blocker date and time   Airway Mallampati: III  TM Distance: >3 FB Neck ROM: Full    Dental  (+) Poor Dentition, Loose, Missing   Pulmonary former smoker,    Pulmonary exam normal        Cardiovascular hypertension, Pt. on medications and Pt. on home beta blockers + CAD, + Peripheral Vascular Disease and +CHF  Normal cardiovascular exam   '22 TTE - EF 50 to 55%. There is moderate left ventricular hypertrophy. Grade II diastolic dysfunction (pseudonormalization). Left atrial size was mildly dilated.     Neuro/Psych PSYCHIATRIC DISORDERS Depression CVA    GI/Hepatic GERD  Medicated and Controlled, IBS    Endo/Other  diabetes, Type 2, Insulin Dependent Obesity   Renal/GU ESRF and DialysisRenal disease  negative genitourinary   Musculoskeletal negative musculoskeletal ROS (+) narcotic dependent  Abdominal   Peds  Hematology  (+) Blood dyscrasia, anemia ,   Anesthesia Other Findings   Reproductive/Obstetrics                           Anesthesia Physical  Anesthesia Plan  ASA: 4  Anesthesia Plan: General   Post-op Pain Management: Tylenol PO (pre-op)*   Induction: Intravenous  PONV Risk Score and Plan: 3 and Midazolam, Treatment may vary due to age or medical condition, Ondansetron and Dexamethasone  Airway Management Planned: LMA  Additional Equipment: None  Intra-op Plan:   Post-operative Plan: Extubation in OR  Informed Consent: I have reviewed the patients History and Physical, chart, labs and discussed the procedure including the risks, benefits and alternatives for the proposed anesthesia with the patient or authorized representative who has indicated his/her  understanding and acceptance.     Dental advisory given  Plan Discussed with: CRNA and Anesthesiologist  Anesthesia Plan Comments:        Anesthesia Quick Evaluation

## 2022-08-07 NOTE — Op Note (Signed)
    NAME: [name]    MRN: [id number] DOB: 22-Jul-1961    DATE OF OPERATION: 08/07/2022  PREOP DIAGNOSIS:    End-stage renal disease  POSTOP DIAGNOSIS:    Same  PROCEDURE:    Right second stage basilic vein transposition  SURGEON: Judeth Cornfield. Scot Dock, MD  ASSIST: Luisa Dago, PA  ANESTHESIA: General  EBL: Minimal  INDICATIONS:    Nancy [name] is a 61 y.o. female who had undergone a previous for stage right basilic vein transposition.  This appeared to be maturing adequately and she presents for a second stage  FINDINGS:   The fistula had an excellent thrill at the completion of the procedure with a brisk radial and ulnar signal with the Doppler  TECHNIQUE:   The patient was taken to the operating room and received a general anesthetic.  The right arm was prepped and draped in the usual sterile fashion.  Using 2 incisions along the medial aspect of the right arm the basilic vein was harvested circumferentially from just above the antecubital level to the axilla.  Branches were divided tween ties.  It was mobilized away from the nerve.  I then created a tunnel lateral to these incisions.  The patient was then heparinized.  The fistula was clamped proximally and divided.  It was then removed from the tunnel and then brought through the newly created tunnel using a clamp to prevent twisting.  The vein was then sewn back into end with two 6-0 Prolene sutures.  At the completion there was an excellent thrill in the fistula.  There was a brisk radial and ulnar signal with the Doppler.  Hemostasis was obtained in the wounds.  Each of the wounds was closed with a deep layer of 3-0 Vicryl and the skin closed with 4-0 Monocryl.  Dermabond was applied.  The patient tolerated the procedure well was transferred to the recovery room in stable condition.  All needle and sponge counts were correct.  Given the complexity of the case,  the assistant was necessary in order to  expedient the procedure and safely perform the technical aspects of the operation.  The assistant provided traction and countertraction to assist with exposure of the artery and vein.  They also assisted with suture ligation of multiple venous branches.  They played a critical role in the anastomosis. These skills, especially following the Prolene suture for the anastomosis, could not have been adequately performed by a scrub tech assistant.    [name], MD, FACS [facility]  DATE OF DICTATION:   08/07/2022

## 2022-08-07 NOTE — Anesthesia Postprocedure Evaluation (Signed)
Anesthesia Post Note  Patient: NAASIA WEILBACHER  Procedure(s) Performed: SECOND STAGE RIGHT BASILIC VEIN TRANSPOSITION (Right: Arm Upper)     Patient location during evaluation: PACU Anesthesia Type: General Level of consciousness: awake and alert Pain management: pain level controlled Vital Signs Assessment: post-procedure vital signs reviewed and stable Respiratory status: spontaneous breathing, nonlabored ventilation, respiratory function stable and patient connected to nasal cannula oxygen Cardiovascular status: blood pressure returned to baseline and stable : Postop N/V, treated (see documentation) Anesthetic complications: no   No notable events documented.  Last Vitals:  Vitals:   08/07/22 1415 08/07/22 1430  BP: (!) 144/57 (!) 160/58  Pulse: 79 83  Resp: 11 13  Temp:  36.6 C  SpO2: 100% 100%    Last Pain:  Vitals:   08/07/22 1430  TempSrc:   PainSc: [place]

## 2022-08-07 NOTE — Transfer of Care (Signed)
Immediate Anesthesia Transfer of Care Note  Patient: Nancy [name]  Procedure(s) Performed: SECOND STAGE RIGHT BASILIC VEIN TRANSPOSITION (Right: Arm Upper)  Patient Location: PACU  Anesthesia Type:General  Level of Consciousness: awake, alert  and oriented  Airway & Oxygen Therapy: Patient Spontanous Breathing and Patient connected to face mask oxygen  Post-op Assessment: Report given to RN and Post -op Vital signs reviewed and stable  Post vital signs: Reviewed and stable  Last Vitals:  Vitals Value Taken Time  BP    Temp    Pulse    Resp    SpO2      Last Pain:  Vitals:   08/07/22 0819  TempSrc:   PainSc: 0-No pain      Patients Stated Pain Goal: 0 (03/75/43 6067)  Complications: No notable events documented.

## 2022-08-07 NOTE — Discharge Instructions (Signed)
[facility]  Discharge Instructions  AV Fistula or Graft Surgery for Dialysis Access  Please refer to the following instructions for your post-procedure care. Your surgeon or physician assistant will discuss any changes with you.  Activity  You may drive the day following your surgery, if you are comfortable and no longer taking prescription pain medication. Resume full activity as the soreness in your incision resolves.  Bathing/Showering  You may shower after you go home. Keep your incision dry for 48 hours. Do not soak in a bathtub, hot tub, or swim until the incision heals completely. You may not shower if you have a hemodialysis catheter.  Incision Care  Clean your incision with mild soap and water after 48 hours. Pat the area dry with a clean towel. You do not need a bandage unless otherwise instructed. Do not apply any ointments or creams to your incision. You may have skin glue on your incision. Do not peel it off. It will come off on its own in about one week. Your arm may swell a bit after surgery. To reduce swelling use pillows to elevate your arm so it is above your heart. Your doctor will tell you if you need to lightly wrap your arm with an ACE bandage.  Diet  Resume your normal diet. There are not special food restrictions following this procedure. In order to heal from your surgery, it is CRITICAL to get adequate nutrition. Your body requires vitamins, minerals, and protein. Vegetables are the best source of vitamins and minerals. Vegetables also provide the perfect balance of protein. Processed food has little nutritional value, so try to avoid this.  Medications  Resume taking all of your medications. If your incision is causing pain, you may take over-the counter pain relievers such as acetaminophen (Tylenol). If you were prescribed a stronger pain medication, please be aware these medications can cause nausea and constipation. Prevent  nausea by taking the medication with a snack or meal. Avoid constipation by drinking plenty of fluids and eating foods with high amount of fiber, such as fruits, vegetables, and grains.  Do not take Tylenol if you are taking prescription pain medications.  Follow up Your surgeon may want to see you in the office following your access surgery. If so, this will be arranged at the time of your surgery.  Please call us immediately for any of the following conditions:  Increased pain, redness, drainage (pus) from your incision site Fever of 101 degrees or higher Severe or worsening pain at your incision site Hand pain or numbness.  Reduce your risk of vascular disease:  Stop smoking. If you would like help, call [facility] at 1-800-QUIT-NOW 316 555 1468) or [place] at [place] your cholesterol Maintain a desired weight Control your diabetes Keep your blood pressure down  Dialysis  It will take several weeks to several months for your new dialysis access to be ready for use. Your surgeon will determine when it is okay to use it. Your nephrologist will continue to direct your dialysis. You can continue to use your Permcath until your new access is ready for use.   08/07/2022 Nancy [name] [id number] 1961-02-29  Surgeon(s): Angelia Mould, MD  Procedure(s): SECOND STAGE RIGHT BASILIC VEIN TRANSPOSITION   May stick graft immediately   May stick graft on designated area only:   x Do not stick fistula for 6 weeks    If you have any questions, please call the office at [phone].

## 2022-08-07 NOTE — Interval H&P Note (Signed)
History and Physical Interval Note:  08/07/2022 10:51 AM  Nancy [name]  has presented today for surgery, with the diagnosis of ESRD.  The various methods of treatment have been discussed with the patient and family. After consideration of risks, benefits and other options for treatment, the patient has consented to  Procedure(s): SECOND STAGE RIGHT BASILIC VEIN TRANSPOSITION (Right) as a surgical intervention.  The patient's history has been reviewed, patient examined, no change in status, stable for surgery.  I have reviewed the patient's chart and labs.  Questions were answered to the patient's satisfaction.     [name]

## 2022-08-08 ENCOUNTER — Encounter (HOSPITAL_COMMUNITY): Payer: Self-pay | Admitting: Vascular Surgery

## 2022-08-08 ENCOUNTER — Telehealth: Payer: Self-pay | Admitting: Physician Assistant

## 2022-08-08 NOTE — Telephone Encounter (Signed)
-----   Message from [facility], [place] sent at 08/07/2022  1:48 PM EDT ----- S/p left 2nd stage basilic vein transposition, 8/22  Follow up in 6 weeks with duplex study on [place] office day

## 2022-08-13 ENCOUNTER — Ambulatory Visit: Payer: Medicare Other | Admitting: Orthopedic Surgery

## 2022-08-14 ENCOUNTER — Telehealth (HOSPITAL_COMMUNITY): Payer: Self-pay

## 2022-08-14 NOTE — Telephone Encounter (Signed)
Received a phone call from Mr. Steiner about his wife who was once a paramedicine patient of mine, who reports Helaina will be getting discharged soon from SNF and will need some handicap accessible supplies at home once she arrives home including a hospital bed, motorized chair and hoyer lift as she is now a bilateral BKA and he will need these to get her moved in and around the home. I will forward this message to CM and PCP for follow up as he reports the SNF is telling him they cannot assist the patient or him in placing orders for the equipment.     Salena Saner, [place] 08/14/2022

## 2022-08-15 DIAGNOSIS — R52 Pain, unspecified: Secondary | ICD-10-CM | POA: Insufficient documentation

## 2022-08-21 ENCOUNTER — Telehealth: Payer: Self-pay | Admitting: Family

## 2022-08-21 ENCOUNTER — Ambulatory Visit (INDEPENDENT_AMBULATORY_CARE_PROVIDER_SITE_OTHER): Payer: Medicare Other | Admitting: Family

## 2022-08-21 ENCOUNTER — Encounter: Payer: Self-pay | Admitting: Family

## 2022-08-21 DIAGNOSIS — Z89512 Acquired absence of left leg below knee: Secondary | ICD-10-CM

## 2022-08-21 DIAGNOSIS — Z89511 Acquired absence of right leg below knee: Secondary | ICD-10-CM | POA: Diagnosis not present

## 2022-08-21 DIAGNOSIS — I6523 Occlusion and stenosis of bilateral carotid arteries: Secondary | ICD-10-CM

## 2022-08-21 NOTE — Telephone Encounter (Signed)
Patient called advised [facility] need a Rx sent to them for her prosthesis. Patient said  [facility] will take her to her appointment on 08/30/2022.  The number to contact patient is [phone]

## 2022-08-21 NOTE — Progress Notes (Signed)
Office Visit Note   Patient: Nancy [name]           Date of Birth: 05-May-1961           MRN: [id number] Visit Date: 08/21/2022              Requested by: [name], MD [place],  [place] [address] PCP: [name], MD  Chief Complaint  Patient presents with   Right Leg - Follow-up    04/04/22 right BKA   Left Leg - Follow-up    04/04/22 left BKA      HPI: The patient is a 61 year old woman who presents status post bilateral below-knee amputations April 04, 2022. She is currently residing at [facility].  She states that she has not yet been set up or casted for her prostheses.  No concerns today.  Patient is a new bilateral transtibial  amputee.  Patient's current comorbidities are not expected to impact the ability to function with the prescribed prosthesis. Patient verbally communicates a strong desire to use a prosthesis. Patient currently requires mobility aids to ambulate without a prosthesis.  Expects not to use mobility aids with a new prosthesis.  Patient is a K2 level ambulator that will use a prosthesis to walk around their home and the community over low level environmental barriers.     Assessment & Plan: Visit Diagnoses: No diagnosis found.  Plan: Given another order for her bilateral below-knee amputation prosthesis set up to [facility].  She is to call them for an appointment.  Follow-Up Instructions: No follow-ups on file.   Ortho Exam  Patient is alert, oriented, no adenopathy, well-dressed, normal affect, normal respiratory effort. On examination bilateral below-knee amputations her residual limbs are well consolidated well-healed there is no callus no area of impending skin breakdown  Imaging: No results found.   Labs: Lab Results  Component Value Date   HGBA1C 6.3 (H) 02/16/2022   HGBA1C 5.3 12/23/2021   HGBA1C 6.8 (H) 09/04/2021   REPTSTATUS 04/03/2022 FINAL 03/29/2022   GRAMSTAIN   02/05/2022    RARE WBC PRESENT, PREDOMINANTLY MONONUCLEAR RARE GRAM NEGATIVE RODS Performed at [facility], [place] [address], [place], [place] [address]    CULT  03/29/2022    NO GROWTH 5 DAYS Performed at [facility] [address] [place], [place] [address]    Doy Hutching CITROBACTER KOSERI 02/05/2022     Lab Results  Component Value Date   ALBUMIN 2.9 (L) 06/03/2022   ALBUMIN 3.3 (L) 05/30/2022   ALBUMIN 3.4 (L) 05/29/2022   PREALBUMIN 8.4 (L) 04/04/2022    Lab Results  Component Value Date   MG 2.0 04/04/2022   MG 2.1 03/30/2022   MG 2.7 (H) 02/14/2022   Lab Results  Component Value Date   VD25OH 14.28 (L) 04/04/2022    Lab Results  Component Value Date   PREALBUMIN 8.4 (L) 04/04/2022      Latest Ref Rng & Units 08/07/2022    8:48 AM 06/03/2022   10:40 AM 05/29/2022    8:33 PM  CBC EXTENDED  WBC 4.0 - 10.5 K/uL  10.2  9.2   RBC 3.87 - 5.11 MIL/uL  4.32  5.31   Hemoglobin 12.0 - 15.0 g/dL 8.2  11.6  14.3   HCT 36.0 - 46.0 % 24.0  38.0  46.4   Platelets 150 - 400 K/uL  237  249      There is  no height or weight on file to calculate BMI.  Orders:  No orders of the defined types were placed in this encounter.  No orders of the defined types were placed in this encounter.    Procedures: No procedures performed  Clinical Data: No additional findings.  ROS:  All other systems negative, except as noted in the HPI. Review of Systems  Objective: Vital Signs: There were no vitals taken for this visit.  Specialty Comments:  No specialty comments available.  PMFS History: Patient Active Problem List   Diagnosis Date Noted   Chest pain 05/30/2022   Volume overload 05/30/2022   Acute pulmonary edema (HCC) 05/30/2022   Nausea 04/10/2022   Hyponatremia 04/04/2022   Prolonged QT interval 04/04/2022   Pressure injury of skin 02/19/2022   Renal anasarca 02/14/2022   ESRD on dialysis [facility]) 02/14/2022   Hyperkalemia 02/14/2022   S/P  arteriovenous (AV) fistula creation 12/22/2021   Critical limb ischemia of both lower extremities ([place]) 11/22/2021   Subacute osteomyelitis, right ankle and foot ([place])    Peripheral artery disease ([place]) 10/27/2021   Acute exacerbation of congestive heart failure ([place]) 10/24/2021   Acute cardiogenic pulmonary edema ([place]) 10/06/2021   CAD (coronary artery disease) 10/06/2021   Goals of care, counseling/discussion    Gangrene of left foot ([place])    Cellulitis of left foot    Diabetic foot ulcers ([place]) 09/04/2021   CHF (congestive heart failure) ([place]) 07/07/2021   Acute postoperative anemia due to expected blood loss superimposed on anemia of chronic renal insufficiency 06/30/2021   CKD (chronic kidney disease), stage IV (HCC) 06/11/2021   Chronic diastolic CHF (congestive heart failure) ([place]) 06/10/2021   Abnormal nuclear stress test    Dyspnea on exertion 03/07/2021   Orthopnea 02/25/2021   History of cerebrovascular accident (CVA) with residual deficit 05/06/2020   Diabetic peripheral neuropathy ([place]) 04/26/2020   AKI (acute kidney injury) ([place]) 04/20/2020   Generalized weakness 04/20/2020   Dehydration 04/20/2020   Generalized abdominal pain    Pancreatitis, acute 02/29/2020   Cerebrovascular accident (CVA) ([place]) 11/08/2019   Stenosis of right carotid artery 11/08/2019   Mixed diabetic hyperlipidemia associated with type 2 diabetes mellitus ([place]) 11/08/2019   Cortical age-related cataract of both eyes 08/30/2019   Gait abnormality 08/30/2019   Statin declined 08/30/2019   Gastroesophageal reflux disease without esophagitis 04/18/2018   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   Insulin dependent type 2 diabetes mellitus ([place]) 10/29/2017   History of macular degeneration 10/29/2017   Chronic cervical pain 10/29/2016   Myalgia 09/14/2016   Insomnia 09/14/2016   Tinea pedis of both feet 08/20/2016   Macular degeneration 08/17/2016   Low back pain 09/21/2014   Hypertensive urgency 09/21/2014    Diabetic gastroparesis associated with type 2 diabetes mellitus ([place]) 09/14/2014   HTN (hypertension) 09/08/2014   Thyroid nodule 08/17/2014   Morbid obesity ([place]) 08/17/2014   Hypokalemia 08/06/2014   Type II diabetes mellitus with neurological manifestations, uncontrolled 08/04/2014   Past Medical History:  Diagnosis Date   Anemia 2006   Anginal pain ([place])    Carotid artery disease ([place])    right ICA occlusion, 09-32% LICA 05/7123 [place]   CHF (congestive heart failure) ([place])    Chronic kidney disease    Coronary artery disease    Depression 2014   previously on amitryptiline    Diabetes mellitus with neurological manifestation ([place]) 2006   Diabetic peripheral neuropathy ([place]) 04/26/2020   Dysphagia    Dyspnea    Fracture  of left ankle 1997   Gastroparesis 07/2016   Heart murmur    HOH (hard of hearing) 2004   Hyperlipidemia 2006   Hypertension 2006   IBS (irritable bowel syndrome) 2002   Leukopenia 2015   Macular degeneration 11/2019   Peripheral vascular disease ([place])    Shingles 2009   Stroke [facility]) 2020   Thyroid nodule 2004    Family History  Problem Relation Age of Onset   Hypertension Mother    Heart disease Mother    Diabetes Mother    Thyroid disease Mother    Congestive Heart Failure Mother    Breast cancer Maternal Grandmother    Colon cancer Maternal Grandfather    Heart attack Sister    Heart disease Brother    Hyperlipidemia Brother    Hypertension Brother    Diabetes Father    Breast cancer Maternal Aunt     Past Surgical History:  Procedure Laterality Date   ABDOMINAL HYSTERECTOMY  2005   AMPUTATION Bilateral 04/04/2022   Procedure: BILATERAL BELOW KNEE AMPUTATION;  Surgeon: Newt Minion, MD;  Location: [facility];  Service: Orthopedics;  Laterality: Bilateral;   AV FISTULA PLACEMENT Right 12/22/2021   Procedure: RIGHT ARM ARTERIOVENOUS (AV) BRACIOCPHELAIC FISTULA CREATION;  Surgeon: Waynetta Sandy, MD;  Location: [facility];  Service: Vascular;   Laterality: Right;   AV FISTULA PLACEMENT Right 02/19/2022   Procedure: RIGHT ARTERIOVENOUS FISTULA;  Surgeon: Angelia Mould, MD;  Location: [facility];  Service: Vascular;  Laterality: Right;   [place] Right 08/07/2022   Procedure: SECOND STAGE RIGHT BASILIC VEIN TRANSPOSITION;  Surgeon: Angelia Mould, MD;  Location: [facility];  Service: Vascular;  Laterality: Right;   CATARACT EXTRACTION Left 11/2019   CESAREAN SECTION  1983    CHOLECYSTECTOMY N/A 03/05/2020   Procedure: LAPAROSCOPIC CHOLECYSTECTOMY WITH INTRAOPERATIVE CHOLANGIOGRAM;  Surgeon: Donnie Mesa, MD;  Location: [facility];  Service: General;  Laterality: N/A;   ESOPHAGOGASTRODUODENOSCOPY (EGD) WITH PROPOFOL Left 08/26/2014   Procedure: ESOPHAGOGASTRODUODENOSCOPY (EGD) WITH PROPOFOL;  Surgeon: Arta Silence, MD;  Location[facility];  Service: Endoscopy;  Laterality: Left;   ESOPHAGOGASTRODUODENOSCOPY (EGD) WITH PROPOFOL N/A 03/03/2020   Procedure: ESOPHAGOGASTRODUODENOSCOPY (EGD) WITH PROPOFOL;  Surgeon: Jerene Bears, MD;  Location[facility];  Service: Gastroenterology;  Laterality: N/A;   IR FLUORO GUIDE CV LINE RIGHT  02/15/2022   IR US GUIDE VASC ACCESS RIGHT  02/15/2022   RIGHT HEART CATH N/A 07/14/2021   Procedure: RIGHT HEART CATH;  Surgeon: Larey Dresser, MD;  Location: [facility];  Service: Cardiovascular;  Laterality: N/A;   RIGHT/LEFT HEART CATH AND CORONARY ANGIOGRAPHY N/A 04/27/2021   Procedure: RIGHT/LEFT HEART CATH AND CORONARY ANGIOGRAPHY;  Surgeon: [name], MD;  Location: [facility];  Service: Cardiovascular;  Laterality: N/A;   Social History   Occupational History   Occupation: Employed FT as Landscape architect    Comment: [employer] , NA  Tobacco Use   Smoking status: Former    Packs/day: 0.25    Years: 0.50    Total pack years: 0.13    Types: Cigarettes    Passive exposure: Never   Smokeless tobacco: Never   Tobacco comments:    Former smoker 11/03/21   Vaping Use   Vaping Use: Never used  Substance and Sexual Activity   Alcohol use: No   Drug use: No   Sexual activity: Not Currently    Birth control/protection: None

## 2022-08-21 NOTE — Telephone Encounter (Signed)
Appt has been scheduled.

## 2022-08-21 NOTE — Telephone Encounter (Signed)
Rx has already been picked up my medical records to be faxed.

## 2022-08-22 ENCOUNTER — Telehealth: Payer: Self-pay

## 2022-08-22 NOTE — Telephone Encounter (Signed)
I spoke with Amy and Amber Brown,NP in regards to the pt having swelling in her arm that started today. She had a 2nd stage Right Basilic Vein Transposition. I advised that swelling is typical after that type of procedure. Amber then stated the pt denies having pain, excessive coldness, dark discoloration. Amber noted that pt still has a bruit but she is unsure if she could feel a thrill so she is going to attempt to find it with the doppler. I advised to try a light Ace wrap on her entire arm and elevate. If the swelling doesn't subside or if the pt is unable to move her hand, if the hand gets cold and has dark discoloration by the end of the week to contact our office for pt to be seen.Amber voiced her understanding.

## 2022-09-13 ENCOUNTER — Other Ambulatory Visit: Payer: Self-pay | Admitting: *Deleted

## 2022-09-13 DIAGNOSIS — N186 End stage renal disease: Secondary | ICD-10-CM

## 2022-09-19 NOTE — H&P (View-Only) (Signed)
POST OPERATIVE OFFICE NOTE    CC:  F/u for surgery  HPI:  Nancy [name] is a 61 y.o. female who is s/p second stage right basilic vein fistula on 4/78/2956 by Dr. Scot Dock.  Her first stage procedure was done on 02/19/2022.  She also has a history of right arm brachiocephalic fistula on 01/17/3085 by Dr. Donzetta Matters, however it failed to mature.  Pt returns today for follow up.  Pt states all of her incisions have healed well postop.  She denies any symptoms of steal such as cold hands/fingertips, reduced grip strength, or pain in her hand.  She currently dialyzes on MWF via [facility] [place] at [facility]. This was placed by IR during her hospitalization. Allergies  Allergen Reactions   Hydralazine Hcl Other (See Comments)    Hair loss   Hydrocodone Itching and Other (See Comments)    Upset stomach   Metformin And Related Nausea And Vomiting and Other (See Comments)    Stomach pains, also   Other Nausea Only and Other (See Comments)    Lettuce- Does not digest this!!   Plaquenil [Hydroxychloroquine Sulfate] Hives   Shellfish-Derived Products Nausea Only and Other (See Comments)    Caused an upset stomach   Shrimp (Diagnostic) Nausea Only and Other (See Comments)    Upset stomach    Sulfa Antibiotics Hives    Current Outpatient Medications  Medication Sig Dispense Refill   amLODipine (NORVASC) 10 MG tablet Take 1 tablet (10 mg total) by mouth daily.     aspirin 81 MG EC tablet Take 1 tablet (81 mg total) by mouth daily. 60 tablet 1   atorvastatin (LIPITOR) 40 MG tablet Take 1 tablet (40 mg total) by mouth daily. 30 tablet 6   Blood Glucose Monitoring Suppl (ONETOUCH VERIO) w/Device KIT Check blood sugar three times daily. E11.40 1 kit 0   calcium acetate (PHOSLO) 667 MG capsule TAKE 1 CAPSULE (667 MG TOTAL) BY MOUTH 3 (THREE) TIMES DAILY WITH MEALS. (Patient not taking: Reported on 08/03/2022) 90 capsule 3   Continuous Blood Gluc Receiver (DEXCOM G6 RECEIVER) DEVI 1 Device by Does not apply route  daily. 1 each 0   Continuous Blood Gluc Sensor (DEXCOM G6 SENSOR) MISC 1 packet by Does not apply route daily. 3 each 11   Continuous Blood Gluc Transmit (DEXCOM G6 TRANSMITTER) MISC 1 packet by Does not apply route daily. 1 each 4   ezetimibe (ZETIA) 10 MG tablet Take 1 tablet (10 mg total) by mouth daily.     ferrous sulfate 325 (65 FE) MG tablet Take 1 tablet (325 mg total) by mouth daily with breakfast. 100 tablet 1   glucose blood test strip Use as instructedCheck blood sugar three times daily. E11.40 100 each 12   HYDROmorphone (DILAUDID) 2 MG tablet Take 1-1.5 tablets (2-3 mg total) by mouth every 4 (four) hours as needed for severe pain (pain score 7-10). 15 tablet 0   insulin glargine (LANTUS) 100 UNIT/ML Solostar Pen Inject 10 Units into the skin at bedtime. 15 mL 0   insulin lispro (HUMALOG KWIKPEN) 100 UNIT/ML KwikPen Inject 3 units before breakfast and dinner. (Patient not taking: Reported on 08/03/2022) 15 mL 3   Insulin Pen Needle 32G X 4 MM MISC Use to inject insulin as directed. 100 each 4   isosorbide mononitrate (IMDUR) 30 MG 24 hr tablet Take 1 tablet (30 mg total) by mouth daily.     labetalol (NORMODYNE) 300 MG tablet Take 1 tablet (300 mg  total) by mouth 2 (two) times daily.     multivitamin (RENA-VIT) TABS tablet Take 1 tablet by mouth at bedtime.  0   NOVOLOG FLEXPEN 100 UNIT/ML FlexPen Inject 3 Units into the skin in the morning and at bedtime.     ondansetron (ZOFRAN) 8 MG tablet Take 1 tablet (8 mg total) by mouth every 8 (eight) hours as needed for nausea 42 tablet 0   oxyCODONE-acetaminophen (PERCOCET) 5-325 MG tablet Take 1-2 tablets by mouth every 6 (six) hours as needed for severe pain. 15 tablet 0   pantoprazole (PROTONIX) 40 MG tablet Take 1 tablet (40 mg total) by mouth daily.     polyethylene glycol (MIRALAX / GLYCOLAX) 17 g packet Take 17 g by mouth daily. 14 each 0   promethazine (PHENERGAN) 25 MG tablet Take 25-50 mg by mouth every 8 (eight) hours as needed  for vomiting or nausea.     sevelamer (RENAGEL) 800 MG tablet Take 1,600 mg by mouth 3 (three) times daily.     zinc sulfate 220 (50 Zn) MG capsule Take 1 capsule (220 mg total) by mouth daily.     No current facility-administered medications for this visit.     ROS:  See HPI  Physical Exam:  Incision:  right upper arm incisions healed well. No signs of infection. Extremities:  right basilic fistula with palpable thrill. Palpable R radial pulse Neuro: intact motor and sensation in RUE  Studies: Dialysis Access Duplex (September 21, 2022):  OUTFLOW VEINPSV (cm/s)Diameter (cm)Depth (cm)          Describe              +------------+----------+-------------+----------+-------------------------  ----+  Near axilla    492                           no disease observed;  however                                                   vessel narrows  slightly     +------------+----------+-------------+----------+-------------------------  ----+  Prox UA         67        0.78        0.45                                   +------------+----------+-------------+----------+-------------------------  ----+  Mid UA          97        0.65        0.45                                   +------------+----------+-------------+----------+-------------------------  ----+  Dist UA        398        0.65        0.38                                   +------------+----------+-------------+----------+-------------------------  ----+    Assessment/Plan:  This is a 61 y.o. female who is s/p: 2nd stage R basilic vein fistula on  08/07/2022 by Dr.Dickson   - Right upper arm incisions well-healed with no signs of infection. - Right basilic fistula with palpable thrill on exam - Duplex study today demonstrates elevated velocities in the outflow vein due to stenosis.  Proximal and mid upper arm portion of fistula with reduced velocities less than 100.  The patient is at risk for  fistula failure due to poor flow. - She will be scheduled for a right arm basilic fistulogram with any provider available in the next 1 to 2 weeks.  This will be done on a nondialysis day, Tuesday/Thursday   Vicente Serene, PA-C [facility] [phone]   Clinic MD:  Scot Dock

## 2022-09-19 NOTE — Progress Notes (Unsigned)
POST OPERATIVE OFFICE NOTE    CC:  F/u for surgery  HPI:  Nancy [name] is a 61 y.o. female who is s/p second stage right basilic vein fistula on 6/96/2952 by Dr. Scot Dock.  Her first stage procedure was done on 02/19/2022.  She also has a history of right arm brachiocephalic fistula on 07/21/1323 by Dr. Donzetta Matters, however it failed to mature.  Pt returns today for follow up.  Pt states all of her incisions have healed well postop.  She denies any symptoms of steal such as cold hands/fingertips, reduced grip strength, or pain in her hand.  She currently dialyzes on MWF via [facility] [place] at [facility]. This was placed by IR during her hospitalization. Allergies  Allergen Reactions   Hydralazine Hcl Other (See Comments)    Hair loss   Hydrocodone Itching and Other (See Comments)    Upset stomach   Metformin And Related Nausea And Vomiting and Other (See Comments)    Stomach pains, also   Other Nausea Only and Other (See Comments)    Lettuce- Does not digest this!!   Plaquenil [Hydroxychloroquine Sulfate] Hives   Shellfish-Derived Products Nausea Only and Other (See Comments)    Caused an upset stomach   Shrimp (Diagnostic) Nausea Only and Other (See Comments)    Upset stomach    Sulfa Antibiotics Hives    Current Outpatient Medications  Medication Sig Dispense Refill   amLODipine (NORVASC) 10 MG tablet Take 1 tablet (10 mg total) by mouth daily.     aspirin 81 MG EC tablet Take 1 tablet (81 mg total) by mouth daily. 60 tablet 1   atorvastatin (LIPITOR) 40 MG tablet Take 1 tablet (40 mg total) by mouth daily. 30 tablet 6   Blood Glucose Monitoring Suppl (ONETOUCH VERIO) w/Device KIT Check blood sugar three times daily. E11.40 1 kit 0   calcium acetate (PHOSLO) 667 MG capsule TAKE 1 CAPSULE (667 MG TOTAL) BY MOUTH 3 (THREE) TIMES DAILY WITH MEALS. (Patient not taking: Reported on 08/03/2022) 90 capsule 3   Continuous Blood Gluc Receiver (DEXCOM G6 RECEIVER) DEVI 1 Device by Does not apply route  daily. 1 each 0   Continuous Blood Gluc Sensor (DEXCOM G6 SENSOR) MISC 1 packet by Does not apply route daily. 3 each 11   Continuous Blood Gluc Transmit (DEXCOM G6 TRANSMITTER) MISC 1 packet by Does not apply route daily. 1 each 4   ezetimibe (ZETIA) 10 MG tablet Take 1 tablet (10 mg total) by mouth daily.     ferrous sulfate 325 (65 FE) MG tablet Take 1 tablet (325 mg total) by mouth daily with breakfast. 100 tablet 1   glucose blood test strip Use as instructedCheck blood sugar three times daily. E11.40 100 each 12   HYDROmorphone (DILAUDID) 2 MG tablet Take 1-1.5 tablets (2-3 mg total) by mouth every 4 (four) hours as needed for severe pain (pain score 7-10). 15 tablet 0   insulin glargine (LANTUS) 100 UNIT/ML Solostar Pen Inject 10 Units into the skin at bedtime. 15 mL 0   insulin lispro (HUMALOG KWIKPEN) 100 UNIT/ML KwikPen Inject 3 units before breakfast and dinner. (Patient not taking: Reported on 08/03/2022) 15 mL 3   Insulin Pen Needle 32G X 4 MM MISC Use to inject insulin as directed. 100 each 4   isosorbide mononitrate (IMDUR) 30 MG 24 hr tablet Take 1 tablet (30 mg total) by mouth daily.     labetalol (NORMODYNE) 300 MG tablet Take 1 tablet (300 mg  total) by mouth 2 (two) times daily.     multivitamin (RENA-VIT) TABS tablet Take 1 tablet by mouth at bedtime.  0   NOVOLOG FLEXPEN 100 UNIT/ML FlexPen Inject 3 Units into the skin in the morning and at bedtime.     ondansetron (ZOFRAN) 8 MG tablet Take 1 tablet (8 mg total) by mouth every 8 (eight) hours as needed for nausea 42 tablet 0   oxyCODONE-acetaminophen (PERCOCET) 5-325 MG tablet Take 1-2 tablets by mouth every 6 (six) hours as needed for severe pain. 15 tablet 0   pantoprazole (PROTONIX) 40 MG tablet Take 1 tablet (40 mg total) by mouth daily.     polyethylene glycol (MIRALAX / GLYCOLAX) 17 g packet Take 17 g by mouth daily. 14 each 0   promethazine (PHENERGAN) 25 MG tablet Take 25-50 mg by mouth every 8 (eight) hours as needed  for vomiting or nausea.     sevelamer (RENAGEL) 800 MG tablet Take 1,600 mg by mouth 3 (three) times daily.     zinc sulfate 220 (50 Zn) MG capsule Take 1 capsule (220 mg total) by mouth daily.     No current facility-administered medications for this visit.     ROS:  See HPI  Physical Exam:  Incision:  right upper arm incisions healed well. No signs of infection. Extremities:  right basilic fistula with palpable thrill. Palpable R radial pulse Neuro: intact motor and sensation in RUE  Studies: Dialysis Access Duplex (10/08/2022):  OUTFLOW VEINPSV (cm/s)Diameter (cm)Depth (cm)          Describe              +------------+----------+-------------+----------+-------------------------  ----+  Near axilla    492                           no disease observed;  however                                                   vessel narrows  slightly     +------------+----------+-------------+----------+-------------------------  ----+  Prox UA         67        0.78        0.45                                   +------------+----------+-------------+----------+-------------------------  ----+  Mid UA          97        0.65        0.45                                   +------------+----------+-------------+----------+-------------------------  ----+  Dist UA        398        0.65        0.38                                   +------------+----------+-------------+----------+-------------------------  ----+    Assessment/Plan:  This is a 61 y.o. female who is s/p: 2nd stage R basilic vein fistula on  08/07/2022 by Dr.Dickson   - Right upper arm incisions well-healed with no signs of infection. - Right basilic fistula with palpable thrill on exam - Duplex study today demonstrates elevated velocities in the outflow vein due to stenosis.  Proximal and mid upper arm portion of fistula with reduced velocities less than 100.  The patient is at risk for  fistula failure due to poor flow. - She will be scheduled for a right arm basilic fistulogram with any provider available in the next 1 to 2 weeks.  This will be done on a nondialysis day, Tuesday/Thursday   Vicente Serene, PA-C [facility] [phone]   Clinic MD:  Scot Dock

## 2022-09-20 ENCOUNTER — Ambulatory Visit (HOSPITAL_COMMUNITY)
Admission: RE | Admit: 2022-09-20 | Discharge: 2022-09-20 | Disposition: A | Discharge status: Home / Self Care | Payer: Medicare Other | Source: Ambulatory Visit | Attending: Vascular Surgery | Admitting: Vascular Surgery

## 2022-09-20 ENCOUNTER — Ambulatory Visit (INDEPENDENT_AMBULATORY_CARE_PROVIDER_SITE_OTHER): Payer: Medicare Other | Admitting: Physician Assistant

## 2022-09-20 ENCOUNTER — Other Ambulatory Visit: Payer: Self-pay

## 2022-09-20 VITALS — BP 138/72 | HR 78 | Temp 97.7°F

## 2022-09-20 DIAGNOSIS — N186 End stage renal disease: Secondary | ICD-10-CM

## 2022-09-20 DIAGNOSIS — Z992 Dependence on renal dialysis: Secondary | ICD-10-CM | POA: Insufficient documentation

## 2022-09-25 ENCOUNTER — Ambulatory Visit (HOSPITAL_COMMUNITY)
Admission: RE | Admit: 2022-09-25 | Discharge: 2022-09-25 | Disposition: A | Discharge status: Home / Self Care | Payer: Medicare Other | Attending: Surgery | Admitting: Surgery

## 2022-09-25 ENCOUNTER — Other Ambulatory Visit: Payer: Self-pay

## 2022-09-25 ENCOUNTER — Encounter (HOSPITAL_COMMUNITY)
Admission: RE | Disposition: A | Discharge status: Home / Self Care | Payer: Self-pay | Source: Home / Self Care | Attending: Surgery

## 2022-09-25 DIAGNOSIS — N186 End stage renal disease: Secondary | ICD-10-CM | POA: Diagnosis not present

## 2022-09-25 DIAGNOSIS — T82858A Stenosis of vascular prosthetic devices, implants and grafts, initial encounter: Secondary | ICD-10-CM | POA: Diagnosis present

## 2022-09-25 DIAGNOSIS — N185 Chronic kidney disease, stage 5: Secondary | ICD-10-CM | POA: Diagnosis not present

## 2022-09-25 DIAGNOSIS — Y841 Kidney dialysis as the cause of abnormal reaction of the patient, or of later complication, without mention of misadventure at the time of the procedure: Secondary | ICD-10-CM | POA: Diagnosis not present

## 2022-09-25 DIAGNOSIS — Z992 Dependence on renal dialysis: Secondary | ICD-10-CM | POA: Diagnosis not present

## 2022-09-25 HISTORY — PX: PERIPHERAL VASCULAR BALLOON ANGIOPLASTY: CATH118281

## 2022-09-25 HISTORY — PX: A/V FISTULAGRAM: CATH118298

## 2022-09-25 LAB — POCT I-STAT, CHEM 8
BUN: 15 mg/dL (ref 8–23)
Calcium, Ion: 1.07 mmol/L — ABNORMAL LOW (ref 1.15–1.40)
Chloride: 96 mmol/L — ABNORMAL LOW (ref 98–111)
Creatinine, Ser: 4.5 mg/dL — ABNORMAL HIGH (ref 0.44–1.00)
Glucose, Bld: 82 mg/dL (ref 70–99)
HCT: 33 % — ABNORMAL LOW (ref 36.0–46.0)
Hemoglobin: 11.2 g/dL — ABNORMAL LOW (ref 12.0–15.0)
Potassium: 3.8 mmol/L (ref 3.5–5.1)
Sodium: 135 mmol/L (ref 135–145)
TCO2: 28 mmol/L (ref 22–32)

## 2022-09-25 LAB — GLUCOSE, CAPILLARY: Glucose-Capillary: 86 mg/dL (ref 70–99)

## 2022-09-25 SURGERY — A/V FISTULAGRAM
Anesthesia: LOCAL | Laterality: Right

## 2022-09-25 MED ORDER — HYDRALAZINE HCL 20 MG/ML IJ SOLN
INTRAMUSCULAR | Status: DC | PRN
  Administered 2022-09-25: 10 mg via INTRAVENOUS

## 2022-09-25 MED ORDER — LIDOCAINE HCL (PF) 1 % IJ SOLN
INTRAMUSCULAR | Status: DC | PRN
  Administered 2022-09-25: 5 mL

## 2022-09-25 MED ORDER — SODIUM CHLORIDE 0.9% FLUSH: 3.0000 mL | Freq: Two times a day (BID) | INTRAVENOUS | Status: DC

## 2022-09-25 MED ORDER — LIDOCAINE HCL (PF) 1 % IJ SOLN
INTRAMUSCULAR | Status: AC
  Filled 2022-09-25: qty 30

## 2022-09-25 MED ORDER — HEPARIN (PORCINE) IN NACL 1000-0.9 UT/500ML-% IV SOLN
INTRAVENOUS | Status: DC | PRN
  Administered 2022-09-25: 500 mL

## 2022-09-25 MED ORDER — HYDRALAZINE HCL 20 MG/ML IJ SOLN
INTRAMUSCULAR | Status: AC
  Filled 2022-09-25: qty 1

## 2022-09-25 MED ORDER — SODIUM CHLORIDE 0.9% FLUSH: 3.0000 mL | INTRAVENOUS | Status: DC | PRN

## 2022-09-25 MED ORDER — SODIUM CHLORIDE 0.9 % IV SOLN: 250.0000 mL | INTRAVENOUS | Status: DC | PRN

## 2022-09-25 MED ORDER — IODIXANOL 320 MG/ML IV SOLN
INTRAVENOUS | Status: DC | PRN
  Administered 2022-09-25: 35 mL

## 2022-09-25 SURGICAL SUPPLY — 16 items
BAG SNAP BAND KOVER 36X36 (MISCELLANEOUS) ×1 IMPLANT
BALLN MUSTANG 6X60X75 (BALLOONS) ×1
BALLOON MUSTANG 6X60X75 (BALLOONS) IMPLANT
COVER DOME SNAP 22 D (MISCELLANEOUS) ×1 IMPLANT
GLIDEWIRE NITREX 0.018X80X5 (WIRE) ×1
GUIDEWIRE NITREX 0.018X80X5 (WIRE) IMPLANT
KIT ENCORE 26 ADVANTAGE (KITS) IMPLANT
KIT MICROPUNCTURE NIT STIFF (SHEATH) IMPLANT
PROTECTION STATION PRESSURIZED (MISCELLANEOUS) ×1
SHEATH PINNACLE R/O II 5F 6CM (SHEATH) IMPLANT
SHEATH PROBE COVER 6X72 (BAG) ×1 IMPLANT
STATION PROTECTION PRESSURIZED (MISCELLANEOUS) ×1 IMPLANT
STOPCOCK MORSE 400PSI 3WAY (MISCELLANEOUS) ×1 IMPLANT
TRAY PV CATH (CUSTOM PROCEDURE TRAY) ×1 IMPLANT
TUBING CIL FLEX 10 FLL-RA (TUBING) ×1 IMPLANT
WIRE BENTSON .035X145CM (WIRE) IMPLANT

## 2022-09-25 NOTE — Op Note (Signed)
    Patient name: Nancy [name] MRN: [id number] DOB: [birth year] Sex: female  09/25/2022 Pre-operative Diagnosis: End-stage renal disease Post-operative diagnosis:  Same Surgeon:  [name] Procedure Performed:  1.  Ultrasound-guided access, right basilic vein  2.  Fistulogram  3.  Peripheral venoplasty (basilic vein)  Indications: This is a 61 year old female status post right basilic vein fistula that has not matured and ultrasound revealed a stenosis.  She is here for fistulogram  Procedure:  The patient was identified in the holding area and taken to room 8.  The patient was then placed supine on the table and prepped and draped in the usual sterile fashion.  A time out was called.  ultrasound was used to evaluate the fistula.  The vein was patent and compressible.  A digital ultrasound image was acquired.  The fistula was then accessed under ultrasound guidance using a micropuncture needle.  An 018 wire was then asvanced without resistance and a micropuncture sheath was placed.  Contrast injections were then performed through the sheath.  Findings: The central venous system is widely patent.  The arterial venous anastomosis is widely patent.  The fistula is patent however there does appear to be a greater than 50% stenosis in the midportion of the fistula.  At this point, I switched out to a 5 [place] sheath over a Bentson wire.  I selected a 6 x 40 Mustang balloon and treated the stenosis.  Completion imaging showed significantly improved results with stenosis less than 15%.  Monocryl suture was used for closure.  Impression:  #1  No evidence of central venous stenosis  #2  Arterial venous anastomosis is widely patent  #3  Mid fistula stenosis resolved after balloon venoplasty using a 6 mm balloon   V. [name], M.D., [facility] [place] Office: [phone] Pager:  [phone]

## 2022-09-25 NOTE — Interval H&P Note (Signed)
History and Physical Interval Note:  09/25/2022 10:45 AM  Nancy [name]  has presented today for surgery, with the diagnosis of end stage renal disease.  The various methods of treatment have been discussed with the patient and family. After consideration of risks, benefits and other options for treatment, the patient has consented to  Procedure(s): A/V Fistulagram (Right) as a surgical intervention.  The patient's history has been reviewed, patient examined, no change in status, stable for surgery.  I have reviewed the patient's chart and labs.  Questions were answered to the patient's satisfaction.     [name]

## 2022-09-26 ENCOUNTER — Encounter (HOSPITAL_COMMUNITY): Payer: Self-pay | Admitting: Surgery

## 2022-09-27 ENCOUNTER — Encounter (HOSPITAL_COMMUNITY): Payer: Self-pay | Admitting: Surgery

## 2022-10-04 ENCOUNTER — Other Ambulatory Visit: Payer: Self-pay | Admitting: *Deleted

## 2022-10-04 DIAGNOSIS — N186 End stage renal disease: Secondary | ICD-10-CM

## 2022-10-22 ENCOUNTER — Ambulatory Visit (HOSPITAL_COMMUNITY): Payer: Medicare Other | Attending: Surgery

## 2022-11-04 ENCOUNTER — Emergency Department (HOSPITAL_COMMUNITY): Payer: Medicare Other

## 2022-11-04 ENCOUNTER — Other Ambulatory Visit: Payer: Self-pay

## 2022-11-04 ENCOUNTER — Encounter (HOSPITAL_COMMUNITY): Payer: Self-pay

## 2022-11-04 ENCOUNTER — Inpatient Hospital Stay (HOSPITAL_COMMUNITY)
Admission: EM | Admit: 2022-11-04 | Discharge: 2022-11-05 | DRG: 640 | Disposition: A | Discharge status: Skilled Nursing Facility | Payer: Medicare Other | Source: Skilled Nursing Facility | Attending: Family Medicine | Admitting: Family Medicine

## 2022-11-04 DIAGNOSIS — E1142 Type 2 diabetes mellitus with diabetic polyneuropathy: Secondary | ICD-10-CM | POA: Diagnosis present

## 2022-11-04 DIAGNOSIS — Z8349 Family history of other endocrine, nutritional and metabolic diseases: Secondary | ICD-10-CM

## 2022-11-04 DIAGNOSIS — E1143 Type 2 diabetes mellitus with diabetic autonomic (poly)neuropathy: Secondary | ICD-10-CM | POA: Diagnosis present

## 2022-11-04 DIAGNOSIS — I10 Essential (primary) hypertension: Secondary | ICD-10-CM | POA: Diagnosis not present

## 2022-11-04 DIAGNOSIS — Z8673 Personal history of transient ischemic attack (TIA), and cerebral infarction without residual deficits: Secondary | ICD-10-CM

## 2022-11-04 DIAGNOSIS — Z90710 Acquired absence of both cervix and uterus: Secondary | ICD-10-CM

## 2022-11-04 DIAGNOSIS — Z992 Dependence on renal dialysis: Secondary | ICD-10-CM

## 2022-11-04 DIAGNOSIS — Z89512 Acquired absence of left leg below knee: Secondary | ICD-10-CM

## 2022-11-04 DIAGNOSIS — E782 Mixed hyperlipidemia: Secondary | ICD-10-CM | POA: Diagnosis present

## 2022-11-04 DIAGNOSIS — Z89511 Acquired absence of right leg below knee: Secondary | ICD-10-CM

## 2022-11-04 DIAGNOSIS — Z8249 Family history of ischemic heart disease and other diseases of the circulatory system: Secondary | ICD-10-CM

## 2022-11-04 DIAGNOSIS — Z20822 Contact with and (suspected) exposure to covid-19: Secondary | ICD-10-CM | POA: Diagnosis present

## 2022-11-04 DIAGNOSIS — Z7982 Long term (current) use of aspirin: Secondary | ICD-10-CM | POA: Diagnosis not present

## 2022-11-04 DIAGNOSIS — E1151 Type 2 diabetes mellitus with diabetic peripheral angiopathy without gangrene: Secondary | ICD-10-CM | POA: Diagnosis present

## 2022-11-04 DIAGNOSIS — Z79899 Other long term (current) drug therapy: Secondary | ICD-10-CM | POA: Diagnosis not present

## 2022-11-04 DIAGNOSIS — Z833 Family history of diabetes mellitus: Secondary | ICD-10-CM

## 2022-11-04 DIAGNOSIS — Z83438 Family history of other disorder of lipoprotein metabolism and other lipidemia: Secondary | ICD-10-CM

## 2022-11-04 DIAGNOSIS — J9601 Acute respiratory failure with hypoxia: Secondary | ICD-10-CM | POA: Diagnosis present

## 2022-11-04 DIAGNOSIS — Z91013 Allergy to seafood: Secondary | ICD-10-CM

## 2022-11-04 DIAGNOSIS — I132 Hypertensive heart and chronic kidney disease with heart failure and with stage 5 chronic kidney disease, or end stage renal disease: Secondary | ICD-10-CM | POA: Diagnosis present

## 2022-11-04 DIAGNOSIS — I639 Cerebral infarction, unspecified: Secondary | ICD-10-CM | POA: Diagnosis present

## 2022-11-04 DIAGNOSIS — N2581 Secondary hyperparathyroidism of renal origin: Secondary | ICD-10-CM | POA: Diagnosis present

## 2022-11-04 DIAGNOSIS — N049 Nephrotic syndrome with unspecified morphologic changes: Secondary | ICD-10-CM

## 2022-11-04 DIAGNOSIS — I2510 Atherosclerotic heart disease of native coronary artery without angina pectoris: Secondary | ICD-10-CM | POA: Diagnosis present

## 2022-11-04 DIAGNOSIS — Z800 Family history of malignant neoplasm of digestive organs: Secondary | ICD-10-CM

## 2022-11-04 DIAGNOSIS — E1122 Type 2 diabetes mellitus with diabetic chronic kidney disease: Secondary | ICD-10-CM | POA: Diagnosis present

## 2022-11-04 DIAGNOSIS — E8770 Fluid overload, unspecified: Secondary | ICD-10-CM | POA: Diagnosis present

## 2022-11-04 DIAGNOSIS — E1169 Type 2 diabetes mellitus with other specified complication: Secondary | ICD-10-CM | POA: Diagnosis present

## 2022-11-04 DIAGNOSIS — I509 Heart failure, unspecified: Secondary | ICD-10-CM | POA: Diagnosis present

## 2022-11-04 DIAGNOSIS — N186 End stage renal disease: Secondary | ICD-10-CM

## 2022-11-04 DIAGNOSIS — Z882 Allergy status to sulfonamides status: Secondary | ICD-10-CM

## 2022-11-04 DIAGNOSIS — Z87891 Personal history of nicotine dependence: Secondary | ICD-10-CM

## 2022-11-04 DIAGNOSIS — Z794 Long term (current) use of insulin: Secondary | ICD-10-CM | POA: Diagnosis not present

## 2022-11-04 DIAGNOSIS — D631 Anemia in chronic kidney disease: Secondary | ICD-10-CM | POA: Diagnosis present

## 2022-11-04 DIAGNOSIS — Z803 Family history of malignant neoplasm of breast: Secondary | ICD-10-CM

## 2022-11-04 DIAGNOSIS — E1149 Type 2 diabetes mellitus with other diabetic neurological complication: Secondary | ICD-10-CM | POA: Diagnosis present

## 2022-11-04 DIAGNOSIS — I739 Peripheral vascular disease, unspecified: Secondary | ICD-10-CM | POA: Diagnosis present

## 2022-11-04 DIAGNOSIS — Z91158 Patient's noncompliance with renal dialysis for other reason: Secondary | ICD-10-CM

## 2022-11-04 DIAGNOSIS — K3184 Gastroparesis: Secondary | ICD-10-CM | POA: Diagnosis present

## 2022-11-04 DIAGNOSIS — E11621 Type 2 diabetes mellitus with foot ulcer: Secondary | ICD-10-CM | POA: Diagnosis present

## 2022-11-04 DIAGNOSIS — L97509 Non-pressure chronic ulcer of other part of unspecified foot with unspecified severity: Secondary | ICD-10-CM | POA: Diagnosis present

## 2022-11-04 DIAGNOSIS — E119 Type 2 diabetes mellitus without complications: Secondary | ICD-10-CM

## 2022-11-04 DIAGNOSIS — R269 Unspecified abnormalities of gait and mobility: Secondary | ICD-10-CM

## 2022-11-04 DIAGNOSIS — Z7401 Bed confinement status: Secondary | ICD-10-CM

## 2022-11-04 DIAGNOSIS — Z8669 Personal history of other diseases of the nervous system and sense organs: Secondary | ICD-10-CM

## 2022-11-04 DIAGNOSIS — R0602 Shortness of breath: Secondary | ICD-10-CM | POA: Diagnosis present

## 2022-11-04 LAB — COMPREHENSIVE METABOLIC PANEL
ALT: 13 U/L (ref 0–44)
AST: 16 U/L (ref 15–41)
Albumin: 3.6 g/dL (ref 3.5–5.0)
Alkaline Phosphatase: 108 U/L (ref 38–126)
Anion gap: 13 (ref 5–15)
BUN: 33 mg/dL — ABNORMAL HIGH (ref 8–23)
CO2: 27 mmol/L (ref 22–32)
Calcium: 8.5 mg/dL — ABNORMAL LOW (ref 8.9–10.3)
Chloride: 96 mmol/L — ABNORMAL LOW (ref 98–111)
Creatinine, Ser: 6.87 mg/dL — ABNORMAL HIGH (ref 0.44–1.00)
GFR, Estimated: 6 mL/min — ABNORMAL LOW (ref >60)
Glucose, Bld: 112 mg/dL — ABNORMAL HIGH (ref 70–99)
Potassium: 4.3 mmol/L (ref 3.5–5.1)
Sodium: 136 mmol/L (ref 135–145)
Total Bilirubin: 0.8 mg/dL (ref 0.3–1.2)
Total Protein: 7.2 g/dL (ref 6.5–8.1)

## 2022-11-04 LAB — I-STAT ARTERIAL BLOOD GAS, ED
Acid-Base Excess: 3 mmol/L — ABNORMAL HIGH (ref 0.0–2.0)
Bicarbonate: 29.9 mmol/L — ABNORMAL HIGH (ref 20.0–28.0)
Calcium, Ion: 1.1 mmol/L — ABNORMAL LOW (ref 1.15–1.40)
HCT: 37 % (ref 36.0–46.0)
Hemoglobin: 12.6 g/dL (ref 12.0–15.0)
O2 Saturation: 93 %
Patient temperature: 97.8
Potassium: 4 mmol/L (ref 3.5–5.1)
Sodium: 135 mmol/L (ref 135–145)
TCO2: 32 mmol/L (ref 22–32)
pCO2 arterial: 52.7 mmHg — ABNORMAL HIGH (ref 32–48)
pH, Arterial: 7.36 (ref 7.35–7.45)
pO2, Arterial: 69 mmHg — ABNORMAL LOW (ref 83–108)

## 2022-11-04 LAB — CBC WITH DIFFERENTIAL/PLATELET
Abs Immature Granulocytes: 0.04 10*3/uL (ref 0.00–0.07)
Abs Immature Granulocytes: 0.04 10*3/uL (ref 0.00–0.07)
Basophils Absolute: 0.1 10*3/uL (ref 0.0–0.1)
Basophils Absolute: 0.1 10*3/uL (ref 0.0–0.1)
Basophils Relative: 1 %
Basophils Relative: 0 %
Eosinophils Absolute: 0.7 10*3/uL — ABNORMAL HIGH (ref 0.0–0.5)
Eosinophils Absolute: 0.5 10*3/uL (ref 0.0–0.5)
Eosinophils Relative: 7 %
Eosinophils Relative: 4 %
HCT: 38.7 % (ref 36.0–46.0)
HCT: 39.5 % (ref 36.0–46.0)
Hemoglobin: 11.4 g/dL — ABNORMAL LOW (ref 12.0–15.0)
Hemoglobin: 11.7 g/dL — ABNORMAL LOW (ref 12.0–15.0)
Immature Granulocytes: 0 %
Immature Granulocytes: 0 %
Lymphocytes Relative: 13 %
Lymphocytes Relative: 8 %
Lymphs Abs: 1.3 10*3/uL (ref 0.7–4.0)
Lymphs Abs: 1 10*3/uL (ref 0.7–4.0)
MCH: 27.1 pg (ref 26.0–34.0)
MCH: 27 pg (ref 26.0–34.0)
MCHC: 29.5 g/dL — ABNORMAL LOW (ref 30.0–36.0)
MCHC: 29.6 g/dL — ABNORMAL LOW (ref 30.0–36.0)
MCV: 91.9 fL (ref 80.0–100.0)
MCV: 91 fL (ref 80.0–100.0)
Monocytes Absolute: 0.6 10*3/uL (ref 0.1–1.0)
Monocytes Absolute: 0.9 10*3/uL (ref 0.1–1.0)
Monocytes Relative: 6 %
Monocytes Relative: 7 %
Neutro Abs: 7.1 10*3/uL (ref 1.7–7.7)
Neutro Abs: 9.8 10*3/uL — ABNORMAL HIGH (ref 1.7–7.7)
Neutrophils Relative %: 73 %
Neutrophils Relative %: 81 %
Platelets: 230 10*3/uL (ref 150–400)
Platelets: 251 10*3/uL (ref 150–400)
RBC: 4.21 MIL/uL (ref 3.87–5.11)
RBC: 4.34 MIL/uL (ref 3.87–5.11)
RDW: 17.2 % — ABNORMAL HIGH (ref 11.5–15.5)
RDW: 17.2 % — ABNORMAL HIGH (ref 11.5–15.5)
WBC: 9.7 10*3/uL (ref 4.0–10.5)
WBC: 12.3 10*3/uL — ABNORMAL HIGH (ref 4.0–10.5)
nRBC: 0 % (ref 0.0–0.2)
nRBC: 0 % (ref 0.0–0.2)

## 2022-11-04 LAB — RESP PANEL BY RT-PCR (FLU A&B, COVID) ARPGX2
Influenza A by PCR: NEGATIVE
Influenza B by PCR: NEGATIVE
SARS Coronavirus 2 by RT PCR: NEGATIVE

## 2022-11-04 LAB — CREATININE, SERUM
Creatinine, Ser: 6.89 mg/dL — ABNORMAL HIGH (ref 0.44–1.00)
GFR, Estimated: 6 mL/min — ABNORMAL LOW (ref >60)

## 2022-11-04 LAB — TROPONIN I (HIGH SENSITIVITY)
Troponin I (High Sensitivity): 8 ng/L (ref <18)
Troponin I (High Sensitivity): 7 ng/L (ref <18)

## 2022-11-04 LAB — HEPATITIS B SURFACE ANTIGEN: Hepatitis B Surface Ag: NONREACTIVE

## 2022-11-04 LAB — BRAIN NATRIURETIC PEPTIDE: B Natriuretic Peptide: 676.3 pg/mL — ABNORMAL HIGH (ref 0.0–100.0)

## 2022-11-04 MED ORDER — ISOSORBIDE MONONITRATE ER 30 MG PO TB24
30.0000 mg | ORAL_TABLET | Freq: Every day | ORAL | Status: DC
  Administered 2022-11-05: 30 mg via ORAL
  Filled 2022-11-04: qty 1

## 2022-11-04 MED ORDER — HEPARIN SODIUM (PORCINE) 5000 UNIT/ML IJ SOLN
5000.0000 [IU] | Freq: Three times a day (TID) | INTRAMUSCULAR | Status: DC
  Administered 2022-11-04 – 2022-11-05 (×3): 5000 [IU] via SUBCUTANEOUS
  Filled 2022-11-04 (×4): qty 1

## 2022-11-04 MED ORDER — HEPARIN SODIUM (PORCINE) 1000 UNIT/ML IJ SOLN
INTRAMUSCULAR | Status: AC
  Filled 2022-11-04: qty 4

## 2022-11-04 MED ORDER — IPRATROPIUM-ALBUTEROL 0.5-2.5 (3) MG/3ML IN SOLN: 3.0000 mL | RESPIRATORY_TRACT | Status: DC | PRN

## 2022-11-04 MED ORDER — INSULIN GLARGINE-YFGN 100 UNIT/ML ~~LOC~~ SOLN
10.0000 [IU] | Freq: Every day | SUBCUTANEOUS | Status: DC
  Administered 2022-11-04: 10 [IU] via SUBCUTANEOUS
  Filled 2022-11-04 (×2): qty 0.1

## 2022-11-04 MED ORDER — ASPIRIN 300 MG RE SUPP: 300.0000 mg | Freq: Every day | RECTAL | Status: DC

## 2022-11-04 MED ORDER — AMLODIPINE BESYLATE 5 MG PO TABS: 10.0000 mg | ORAL_TABLET | Freq: Every day | ORAL | Status: DC

## 2022-11-04 MED ORDER — CHLORHEXIDINE GLUCONATE CLOTH 2 % EX PADS
6.0000 | MEDICATED_PAD | Freq: Every day | CUTANEOUS | Status: DC
  Administered 2022-11-05: 6 via TOPICAL

## 2022-11-04 MED ORDER — EZETIMIBE 10 MG PO TABS
10.0000 mg | ORAL_TABLET | Freq: Every day | ORAL | Status: DC
  Administered 2022-11-04 – 2022-11-05 (×2): 10 mg via ORAL
  Filled 2022-11-04 (×2): qty 1

## 2022-11-04 MED ORDER — SEVELAMER CARBONATE 800 MG PO TABS
1600.0000 mg | ORAL_TABLET | Freq: Three times a day (TID) | ORAL | Status: DC
  Administered 2022-11-04 – 2022-11-05 (×3): 1600 mg via ORAL
  Filled 2022-11-04 (×3): qty 2

## 2022-11-04 MED ORDER — SODIUM CHLORIDE 0.9 % IV SOLN: 250.0000 mL | INTRAVENOUS | Status: DC | PRN

## 2022-11-04 MED ORDER — LABETALOL HCL 200 MG PO TABS
300.0000 mg | ORAL_TABLET | Freq: Two times a day (BID) | ORAL | Status: DC
  Administered 2022-11-04 – 2022-11-05 (×2): 300 mg via ORAL
  Filled 2022-11-04 (×2): qty 2

## 2022-11-04 MED ORDER — ATORVASTATIN CALCIUM 40 MG PO TABS
40.0000 mg | ORAL_TABLET | Freq: Every day | ORAL | Status: DC
  Administered 2022-11-04 – 2022-11-05 (×2): 40 mg via ORAL
  Filled 2022-11-04 (×2): qty 1

## 2022-11-04 MED ORDER — ISOSORBIDE MONONITRATE ER 30 MG PO TB24: 30.0000 mg | ORAL_TABLET | Freq: Every day | ORAL | Status: DC

## 2022-11-04 MED ORDER — RENA-VITE PO TABS
1.0000 | ORAL_TABLET | Freq: Every day | ORAL | Status: DC
  Administered 2022-11-04: 1 via ORAL
  Filled 2022-11-04 (×2): qty 1

## 2022-11-04 MED ORDER — SODIUM CHLORIDE 0.9% FLUSH
3.0000 mL | Freq: Two times a day (BID) | INTRAVENOUS | Status: DC
  Administered 2022-11-04 – 2022-11-05 (×3): 3 mL via INTRAVENOUS

## 2022-11-04 MED ORDER — ACETAMINOPHEN 325 MG PO TABS
650.0000 mg | ORAL_TABLET | ORAL | Status: DC | PRN
  Administered 2022-11-04 – 2022-11-05 (×2): 650 mg via ORAL
  Filled 2022-11-04 (×2): qty 2

## 2022-11-04 MED ORDER — PANTOPRAZOLE SODIUM 40 MG PO TBEC
40.0000 mg | DELAYED_RELEASE_TABLET | Freq: Every day | ORAL | Status: DC
  Administered 2022-11-04 – 2022-11-05 (×2): 40 mg via ORAL
  Filled 2022-11-04 (×2): qty 1

## 2022-11-04 MED ORDER — SODIUM CHLORIDE 0.9% FLUSH: 3.0000 mL | INTRAVENOUS | Status: DC | PRN

## 2022-11-04 MED ORDER — ASPIRIN 81 MG PO TBEC: 81.0000 mg | DELAYED_RELEASE_TABLET | Freq: Every day | ORAL | Status: DC

## 2022-11-04 MED ORDER — CALCIUM ACETATE (PHOS BINDER) 667 MG PO CAPS: 667.0000 mg | ORAL_CAPSULE | Freq: Three times a day (TID) | ORAL | Status: DC

## 2022-11-04 MED ORDER — HEPARIN SODIUM (PORCINE) 1000 UNIT/ML IJ SOLN
INTRAMUSCULAR | Status: AC
  Administered 2022-11-04: 4000 [IU]
  Filled 2022-11-04: qty 8

## 2022-11-04 MED ORDER — AMLODIPINE BESYLATE 10 MG PO TABS
10.0000 mg | ORAL_TABLET | Freq: Every day | ORAL | Status: DC
  Administered 2022-11-05: 10 mg via ORAL
  Filled 2022-11-04: qty 1

## 2022-11-04 MED ORDER — DIPHENHYDRAMINE HCL 50 MG/ML IJ SOLN
25.0000 mg | Freq: Once | INTRAMUSCULAR | Status: AC
  Administered 2022-11-04: 25 mg via INTRAVENOUS
  Filled 2022-11-04: qty 1

## 2022-11-04 NOTE — ED Provider Notes (Signed)
[name] EMERGENCY DEPARTMENT Provider Note   CSN: [id number] Arrival date & time: 11/04/22  0105     History {Add pertinent medical, surgical, social history, OB history to HPI:1} No chief complaint on file.   Nancy [name] is a 61 y.o. female.  Patient presents to the emergency department for evaluation of shortness of breath.  Patient reports that she has had progressive shortness of breath through the day today.  She is a dialysis patient, missed her dialysis session yesterday.  She reports that she did not go because she was experiencing nausea, vomiting and diarrhea.  Patient denies chest pain.  She has brought to the ER on CPAP.  EMS reports that she was in severe distress upon their arrival, has improved with CPAP.       Home Medications Prior to Admission medications   Medication Sig Start Date End Date Taking? Authorizing Provider  amLODipine (NORVASC) 10 MG tablet Take 1 tablet (10 mg total) by mouth daily. 06/04/22   Shelly Coss, MD  aspirin 81 MG EC tablet Take 1 tablet (81 mg total) by mouth daily. 09/26/21   Milford, Maricela Bo, FNP  atorvastatin (LIPITOR) 40 MG tablet Take 1 tablet (40 mg total) by mouth daily. 11/07/21   Larey Dresser, MD  Blood Glucose Monitoring Suppl [facility] VERIO) w/Device KIT Check blood sugar three times daily. E11.40 05/06/20   [name], MD  calcium acetate (PHOSLO) 667 MG capsule TAKE 1 CAPSULE (667 MG TOTAL) BY MOUTH 3 (THREE) TIMES DAILY WITH MEALS. 02/08/22   [name], MD  Continuous Blood Gluc Receiver (DEXCOM G6 RECEIVER) DEVI 1 Device by Does not apply route daily. 11/15/21   [name], MD  Continuous Blood Gluc Sensor (DEXCOM G6 SENSOR) MISC 1 packet by Does not apply route daily. 11/15/21   [name], MD  Continuous Blood Gluc Transmit (DEXCOM G6 TRANSMITTER) MISC 1 packet by Does not apply route daily. 11/15/21   [name], MD  ezetimibe (ZETIA) 10 MG  tablet Take 1 tablet (10 mg total) by mouth daily. 04/11/22   Danford, Suann Larry, MD  ferrous sulfate 325 (65 FE) MG tablet Take 1 tablet (325 mg total) by mouth daily with breakfast. 11/07/21   [name], MD  glucose blood test strip Use as instructedCheck blood sugar three times daily. E11.40 07/11/20   [name], MD  HYDROmorphone (DILAUDID) 2 MG tablet Take 1-1.5 tablets (2-3 mg total) by mouth every 4 (four) hours as needed for severe pain (pain score 7-10). 04/10/22   Danford, Suann Larry, MD  insulin glargine (LANTUS) 100 UNIT/ML Solostar Pen Inject 10 Units into the skin at bedtime. 04/10/22   Danford, Suann Larry, MD  insulin lispro (HUMALOG KWIKPEN) 100 UNIT/ML KwikPen Inject 3 units before breakfast and dinner. 04/10/22   Danford, Suann Larry, MD  Insulin Pen Needle 32G X 4 MM MISC Use to inject insulin as directed. 11/07/21   [name], MD  isosorbide mononitrate (IMDUR) 30 MG 24 hr tablet Take 1 tablet (30 mg total) by mouth daily. 06/04/22   Shelly Coss, MD  labetalol (NORMODYNE) 300 MG tablet Take 1 tablet (300 mg total) by mouth 2 (two) times daily. 06/04/22   Shelly Coss, MD  multivitamin (RENA-VIT) TABS tablet Take 1 tablet by mouth at bedtime. 04/10/22   Danford, Suann Larry, MD  NOVOLOG FLEXPEN 100 UNIT/ML FlexPen Inject 3 Units into the skin in the morning and at bedtime.  06/04/22   [provider]  ondansetron (ZOFRAN) 8 MG tablet Take 1 tablet (8 mg total) by mouth every 8 (eight) hours as needed for nausea 05/23/22     oxyCODONE-acetaminophen (PERCOCET) 5-325 MG tablet Take 1-2 tablets by mouth every 6 (six) hours as needed for severe pain. 08/07/22 08/07/23  Schuh, McKenzi P, PA-C  pantoprazole (PROTONIX) 40 MG tablet Take 1 tablet (40 mg total) by mouth daily. 04/11/22   Danford, Suann Larry, MD  polyethylene glycol (MIRALAX / GLYCOLAX) 17 g packet Take 17 g by mouth daily. 06/04/22   Shelly Coss, MD  promethazine (PHENERGAN) 25  MG tablet Take 25-50 mg by mouth every 8 (eight) hours as needed for vomiting or nausea. 04/23/22   [provider]  sevelamer (RENAGEL) 800 MG tablet Take 1,600 mg by mouth 3 (three) times daily. 07/06/22   [provider]  zinc sulfate 220 (50 Zn) MG capsule Take 1 capsule (220 mg total) by mouth daily. 04/11/22   Danford, Suann Larry, MD  Insulin NPH Isophane & Regular (RELION 70/30 [place]) Inject 35 Units into the skin 2 (two) times daily.  08/17/14  [provider]      Allergies    Hydralazine, Hydralazine hcl, Hydrocodone, Metformin and related, Other, Plaquenil [hydroxychloroquine sulfate], Shellfish allergy, Shellfish-derived products, Shrimp (diagnostic), and Sulfa antibiotics    Review of Systems   Review of Systems  Physical Exam Updated Vital Signs There were no vitals taken for this visit. Physical Exam Vitals and nursing note reviewed.  Constitutional:      General: She is not in acute distress.    Appearance: She is well-developed.  HENT:     Head: Normocephalic and atraumatic.     Mouth/Throat:     Mouth: Mucous membranes are moist.  Eyes:     General: Vision grossly intact. Gaze aligned appropriately.     Extraocular Movements: Extraocular movements intact.     Conjunctiva/sclera: Conjunctivae normal.  Cardiovascular:     Rate and Rhythm: Normal rate and regular rhythm.     Pulses: Normal pulses.     Heart sounds: Normal heart sounds, S1 normal and S2 normal. No murmur heard.    No friction rub. No gallop.  Pulmonary:     Effort: Pulmonary effort is normal. No respiratory distress.     Breath sounds: Decreased air movement present. Wheezing and rales present.  Abdominal:     General: Bowel sounds are normal.     Palpations: Abdomen is soft.     Tenderness: There is no abdominal tenderness. There is no guarding or rebound.     Hernia: No hernia is present.  Musculoskeletal:        General: No swelling.     Cervical back: Full passive  range of motion without pain, normal range of motion and neck supple. No spinous process tenderness or muscular tenderness. Normal range of motion.     Right lower leg: No edema.     Left lower leg: No edema.     Right Lower Extremity: Right leg is amputated below knee.     Left Lower Extremity: Left leg is amputated below knee.  Skin:    General: Skin is warm and dry.     Capillary Refill: Capillary refill takes less than 2 seconds.     Findings: No ecchymosis, erythema, rash or wound.  Neurological:     General: No focal deficit present.     Mental Status: She is alert and oriented to person,  place, and time.     GCS: GCS eye subscore is 4. GCS verbal subscore is 5. GCS motor subscore is 6.     Cranial Nerves: Cranial nerves 2-12 are intact.     Sensory: Sensation is intact.     Motor: Motor function is intact.     Coordination: Coordination is intact.  Psychiatric:        Attention and Perception: Attention normal.        Mood and Affect: Mood normal.        Speech: Speech normal.        Behavior: Behavior normal.     ED Results / Procedures / Treatments   Labs (all labs ordered are listed, but only abnormal results are displayed) Labs Reviewed  RESP PANEL BY RT-PCR (FLU A&B, COVID) ARPGX2  CBC WITH DIFFERENTIAL/PLATELET  COMPREHENSIVE METABOLIC PANEL  BRAIN NATRIURETIC PEPTIDE  I-STAT ARTERIAL BLOOD GAS, ED  TROPONIN I (HIGH SENSITIVITY)    EKG None  Radiology No results found.  Procedures Procedures  {Document cardiac monitor, telemetry assessment procedure when appropriate:1}  Medications Ordered in ED Medications - No data to display  ED Course/ Medical Decision Making/ A&P                           Medical Decision Making Amount and/or Complexity of Data Reviewed Labs: ordered. Radiology: ordered.   ***  {Document critical care time when appropriate:1} {Document review of labs and clinical decision tools ie heart score, Chads2Vasc2 etc:1}   {Document your independent review of radiology images, and any outside records:1} {Document your discussion with family members, caretakers, and with consultants:1} {Document social determinants of health affecting pt's care:1} {Document your decision making why or why not admission, treatments were needed:1} Final Clinical Impression(s) / ED Diagnoses Final diagnoses:  None    Rx / DC Orders ED Discharge Orders     None

## 2022-11-04 NOTE — ED Triage Notes (Signed)
Pt BIB [facility], pt has had SOB getting worse through out the day. Ems administered 1 nitro, and 1 albuterol treatment. Pt is on CPAP O2 99  Pt looks to be in distress Denies chest pain 140/98  CBG 124 20 LAC

## 2022-11-04 NOTE — H&P (Signed)
PCP:   [name], MD   Chief Complaint:  Shortness of breath  HPI: This is a 61 year old nursing home resident.  Her past medical history includes ESRD on HD M/W/F, HTN, hyperlipidemia, CAD, history of CHF, diabetes mellitus.  Patient has bilateral BKA's and is bedbound.  On Friday she was having lots of diarrhea, which has since resolved.  She said that she missed her hemodialysis on Friday.  Today she had sudden onset of shortness of breath that became progressively worse.  She was sent to the ER.  Patient arrived to the ER on CPAP in respiratory distress, she was changed to BiPAP and is much more comfortable.  History provided by patient.  Review of Systems:  The patient denies anorexia, fever, weight loss,, vision loss, decreased hearing, hoarseness, chest pain, syncope, dyspnea on exertion, peripheral edema, balance deficits, hemoptysis, abdominal pain, melena, hematochezia, severe indigestion/heartburn, hematuria, incontinence, genital sores, muscle weakness, suspicious skin lesions, transient blindness, difficulty walking, depression, unusual weight change, abnormal bleeding, enlarged lymph nodes, angioedema, and breast masses.  Past Medical History: Past Medical History:  Diagnosis Date   Anemia 2006   Anginal pain ([place])    Carotid artery disease ([place])    right ICA occlusion, 97-67% LICA 02/4192 [place]   CHF (congestive heart failure) ([place])    Chronic kidney disease    Coronary artery disease    Depression 2014   previously on amitryptiline    Diabetes mellitus with neurological manifestation ([place]) 2006   Diabetic peripheral neuropathy ([place]) 04/26/2020   Dysphagia    Dyspnea    Fracture of left ankle 1997   Gastroparesis 07/2016   Heart murmur    HOH (hard of hearing) 2004   Hyperlipidemia 2006   Hypertension 2006   IBS (irritable bowel syndrome) 2002   Leukopenia 2015   Macular degeneration 11/2019   Peripheral vascular disease ([place])    Shingles 2009   Stroke  [facility]) 2020   Thyroid nodule 2004   Past Surgical History:  Procedure Laterality Date   A/V FISTULAGRAM Right 09/25/2022   Procedure: A/V Fistulagram;  Surgeon: Serafina Mitchell, MD;  Location: [place] [facility];  Service: Cardiovascular;  Laterality: Right;   ABDOMINAL HYSTERECTOMY  2005   AMPUTATION Bilateral 04/04/2022   Procedure: BILATERAL BELOW KNEE AMPUTATION;  Surgeon: Newt Minion, MD;  Location: [place];  Service: Orthopedics;  Laterality: Bilateral;   AV FISTULA PLACEMENT Right 12/22/2021   Procedure: RIGHT ARM ARTERIOVENOUS (AV) BRACIOCPHELAIC FISTULA CREATION;  Surgeon: Waynetta Sandy, MD;  Location: [place];  Service: Vascular;  Laterality: Right;   AV FISTULA PLACEMENT Right 02/19/2022   Procedure: RIGHT ARTERIOVENOUS FISTULA;  Surgeon: Angelia Mould, MD;  Location: [place];  Service: Vascular;  Laterality: Right;   [place] Right 08/07/2022   Procedure: SECOND STAGE RIGHT BASILIC VEIN TRANSPOSITION;  Surgeon: Angelia Mould, MD;  Location: [place];  Service: Vascular;  Laterality: Right;   CATARACT EXTRACTION Left 11/2019   CESAREAN SECTION  1983    CHOLECYSTECTOMY N/A 03/05/2020   Procedure: LAPAROSCOPIC CHOLECYSTECTOMY WITH INTRAOPERATIVE CHOLANGIOGRAM;  Surgeon: Donnie Mesa, MD;  Location: [facility];  Service: General;  Laterality: N/A;   ESOPHAGOGASTRODUODENOSCOPY (EGD) WITH PROPOFOL Left 08/26/2014   Procedure: ESOPHAGOGASTRODUODENOSCOPY (EGD) WITH PROPOFOL;  Surgeon: Arta Silence, MD;  Location: [facility];  Service: Endoscopy;  Laterality: Left;   ESOPHAGOGASTRODUODENOSCOPY (EGD) WITH PROPOFOL N/A 03/03/2020   Procedure: ESOPHAGOGASTRODUODENOSCOPY (EGD) WITH PROPOFOL;  Surgeon: Jerene Bears, MD;  [facility];  Service: Gastroenterology;  Laterality: N/A;   IR FLUORO GUIDE CV LINE RIGHT  02/15/2022   IR US GUIDE VASC ACCESS RIGHT  02/15/2022   PERIPHERAL VASCULAR BALLOON ANGIOPLASTY Right 09/25/2022   Procedure: PERIPHERAL  VASCULAR BALLOON ANGIOPLASTY;  Surgeon: Brabham, Vance W, MD;  [facility];  Service: Cardiovascular;  Laterality: Right;  AVF   RIGHT HEART CATH N/A 07/14/2021   Procedure: RIGHT HEART CATH;  Surgeon: McLean, Dalton S, MD;  [facility];  Service: Cardiovascular;  Laterality: N/A;   RIGHT/LEFT HEART CATH AND CORONARY ANGIOGRAPHY N/A 04/27/2021   Procedure: RIGHT/LEFT HEART CATH AND CORONARY ANGIOGRAPHY;  Surgeon: Berry, Jonathan J, MD;  [facility];  Service: Cardiovascular;  Laterality: N/A;    Medications: Prior to Admission medications   Medication Sig Start Date End Date Taking? Authorizing Provider  amLODipine (NORVASC) 10 MG tablet Take 1 tablet (10 mg total) by mouth daily. 06/04/22   Adhikari, Amrit, MD  aspirin 81 MG EC tablet Take 1 tablet (81 mg total) by mouth daily. 09/26/21   Milford, Jessica M, FNP  atorvastatin (LIPITOR) 40 MG tablet Take 1 tablet (40 mg total) by mouth daily. 11/07/21   McLean, Dalton S, MD  Blood Glucose Monitoring Suppl (ONETOUCH VERIO) w/Device KIT Check blood sugar three times daily. E11.40 05/06/20   Johnson, Deborah B, MD  calcium acetate (PHOSLO) 667 MG capsule TAKE 1 CAPSULE (667 MG TOTAL) BY MOUTH 3 (THREE) TIMES DAILY WITH MEALS. 02/08/22   Johnson, Deborah B, MD  Continuous Blood Gluc Receiver (DEXCOM G6 RECEIVER) DEVI 1 Device by Does not apply route daily. 11/15/21   Johnson, Deborah B, MD  Continuous Blood Gluc Sensor (DEXCOM G6 SENSOR) MISC 1 packet by Does not apply route daily. 11/15/21   Johnson, Deborah B, MD  Continuous Blood Gluc Transmit (DEXCOM G6 TRANSMITTER) MISC 1 packet by Does not apply route daily. 11/15/21   Johnson, Deborah B, MD  ezetimibe (ZETIA) 10 MG tablet Take 1 tablet (10 mg total) by mouth daily. 04/11/22   Danford, Christopher P, MD  ferrous sulfate 325 (65 FE) MG tablet Take 1 tablet (325 mg total) by mouth daily with breakfast. 11/07/21   Johnson, Deborah B, MD  glucose blood  test strip Use as instructedCheck blood sugar three times daily. E11.40 07/11/20   Johnson, Deborah B, MD  HYDROmorphone (DILAUDID) 2 MG tablet Take 1-1.5 tablets (2-3 mg total) by mouth every 4 (four) hours as needed for severe pain (pain score 7-10). 04/10/22   Danford, Christopher P, MD  insulin glargine (LANTUS) 100 UNIT/ML Solostar Pen Inject 10 Units into the skin at bedtime. 04/10/22   Danford, Christopher P, MD  insulin lispro (HUMALOG KWIKPEN) 100 UNIT/ML KwikPen Inject 3 units before breakfast and dinner. 04/10/22   Danford, Christopher P, MD  Insulin Pen Needle 32G X 4 MM MISC Use to inject insulin as directed. 11/07/21   Johnson, Deborah B, MD  isosorbide mononitrate (IMDUR) 30 MG 24 hr tablet Take 1 tablet (30 mg total) by mouth daily. 06/04/22   Adhikari, Amrit, MD  labetalol (NORMODYNE) 300 MG tablet Take 1 tablet (300 mg total) by mouth 2 (two) times daily. 06/04/22   Adhikari, Amrit, MD  multivitamin (RENA-VIT) TABS tablet Take 1 tablet by mouth at bedtime. 04/10/22   Danford, Christopher P, MD  NOVOLOG FLEXPEN 100 UNIT/ML FlexPen Inject 3 Units into the skin in the morning and at bedtime. 06/04/22   [provider]    ondansetron (ZOFRAN) 8 MG tablet Take 1 tablet (8 mg total) by mouth every 8 (eight) hours as needed for nausea 05/23/22     oxyCODONE-acetaminophen (PERCOCET) 5-325 MG tablet Take 1-2 tablets by mouth every 6 (six) hours as needed for severe pain. 08/07/22 08/07/23  Schuh, McKenzi P, PA-C  pantoprazole (PROTONIX) 40 MG tablet Take 1 tablet (40 mg total) by mouth daily. 04/11/22   Danford, Christopher P, MD  polyethylene glycol (MIRALAX / GLYCOLAX) 17 g packet Take 17 g by mouth daily. 06/04/22   Adhikari, Amrit, MD  promethazine (PHENERGAN) 25 MG tablet Take 25-50 mg by mouth every 8 (eight) hours as needed for vomiting or nausea. 04/23/22   [provider]  sevelamer (RENAGEL) 800 MG tablet Take 1,600 mg by mouth 3 (three) times daily. 07/06/22   [provider]  zinc sulfate 220 (50 Zn) MG capsule Take 1 capsule (220 mg total) by mouth daily. 04/11/22   Danford, Christopher P, MD  Insulin NPH Isophane & Regular (RELION 70/30 [place]) Inject 35 Units into the skin 2 (two) times daily.  08/17/14  [provider]    Allergies:   Allergies  Allergen Reactions   Hydralazine Other (See Comments)   Hydralazine Hcl Other (See Comments)    Hair loss   Hydrocodone Itching and Other (See Comments)    Upset stomach   Metformin And Related Nausea And Vomiting and Other (See Comments)    Stomach pains, also   Other Nausea Only and Other (See Comments)    Lettuce- Does not digest this!!   Plaquenil [Hydroxychloroquine Sulfate] Hives   Shellfish Allergy Nausea And Vomiting   Shellfish-Derived Products Nausea Only and Other (See Comments)    Caused an upset stomach   Shrimp (Diagnostic) Nausea Only and Other (See Comments)    Upset stomach    Sulfa Antibiotics Hives    Social History:  reports that she has quit smoking. Her smoking use included cigarettes. She has a 0.13 pack-year smoking history. She has never been exposed to tobacco smoke. She has never used smokeless tobacco. She reports that she does not drink alcohol and does not use drugs.  Family History: Family History  Problem Relation Age of Onset   Hypertension Mother    Heart disease Mother    Diabetes Mother    Thyroid disease Mother    Congestive Heart Failure Mother    Breast cancer Maternal Grandmother    Colon cancer Maternal Grandfather    Heart attack Sister    Heart disease Brother    Hyperlipidemia Brother    Hypertension Brother    Diabetes Father    Breast cancer Maternal Aunt     Physical Exam: Vitals:   11/04/22 0100 11/04/22 0113 11/04/22 0230 11/04/22 0245  BP:  (!) 154/75 (!) 150/66 (!) 166/68  Pulse: 81 81 78 78  Resp: 14 (!) 21 15 10  Temp:  97.8 F (36.6 C)    TempSrc:  Axillary    SpO2: 99% 99% 100% 100%    General:  Alert and oriented times  three, well developed and nourished, on BiPAP, ill-appearing, Eyes: PERRLA, pink conjunctiva, no scleral icterus ENT: Dry oral mucosa, neck supple, no thyromegaly Lungs: clear to ascultation, no wheeze, no crackles, no use of accessory muscles Cardiovascular: regular rate and rhythm, no regurgitation, no gallops, no murmurs. No carotid bruits, no JVD Abdomen: soft, positive BS, non-tender, non-distended, no organomegaly, not an acute abdomen GU: not examined Neuro: CN II - XII   grossly intact, sensation intact Musculoskeletal: Bilateral BKA Skin: no rash, no subcutaneous crepitation, no decubitus Psych: appropriate patient  Labs on Admission:  Recent Labs    11/04/22 0117 11/04/22 0134  NA 136 135  K 4.3 4.0  CL 96*  --   CO2 27  --   GLUCOSE 112*  --   BUN 33*  --   CREATININE 6.87*  --   CALCIUM 8.5*  --    Recent Labs    11/04/22 0117  AST 16  ALT 13  ALKPHOS 108  BILITOT 0.8  PROT 7.2  ALBUMIN 3.6   No results for input(s): "LIPASE", "AMYLASE" in the last 72 hours. Recent Labs    11/04/22 0117 11/04/22 0134  WBC 9.7  --   NEUTROABS 7.1  --   HGB 11.4* 12.6  HCT 38.7 37.0  MCV 91.9  --   PLT 230  --       Radiological Exams on Admission: DG Chest Port 1 View  Result Date: 11/04/2022 CLINICAL DATA:  Shortness of breath EXAM: PORTABLE CHEST 1 VIEW COMPARISON:  05/29/2022 FINDINGS: Right dialysis catheter remains in place, unchanged. Cardiomegaly. Diffuse bilateral airspace disease, worsening since prior study, likely pulmonary edema. Low lung volumes. No visible effusions or acute bony abnormality. IMPRESSION: Worsening diffuse bilateral airspace disease, likely edema. Electronically Signed   By: Rolm Baptise M.D.   On: 11/04/2022 01:22    Assessment/Plan Present on Admission:  Acute respiratory failure with hypoxia (HCC)  Fluid overload/ESRD on HD  Secondary hyperparathyroidism of renal origin ([place]) -Admit to progressive -Currently stable, nephrology  consulted for hemodialysis in a.m. -Resume patient's [place],   HTN (hypertension) -Stable, resume Norvasc, labetalol and Imdur -As needed blood pressure meds ordered   Diabetic gastroparesis associated with type 2 diabetes mellitus ([place]) -Sliding scale insulin ordered -Patient's 70/30 resumed   Mixed hyperlipidemia  -Stable, atorvastatin resumed   CVA -Patient Zetia, atorvastatin, aspirin resumed   Peripheral artery disease ([place])  Morbid obesity ([place])  Destine Ambroise 11/04/2022, 3:34 AM

## 2022-11-04 NOTE — ED Notes (Signed)
MD contacted for admission clarification.

## 2022-11-04 NOTE — Consult Note (Signed)
Renal Service Consult Note [facility]  Nancy [name] 11/04/2022 Sol Blazing, MD Requesting Physician: Dr. Claria Dice  Reason for Consult: ESRD pt w/ resp distress HPI: The patient is a 61 y.o. year-old w/ PMH as below who presented to ED overnight last night for sudden onset SOB. Pt missed her dialysis appt on Friday 11/17 due to a diarrhea episode. In ED pt arrived on Cpap and was changed to Bipap. CXR showed bilateral infiltrates possibly pulm edema. We are asked to see for dialysis.   Pt seen in HD unit. Still is SOB, but down to [place] O2 support. Denies any CP or cough or abd pain.   ROS - denies CP, no joint pain, no HA, no blurry vision, no rash, no dysuria, no difficulty voiding   Past Medical History  Past Medical History:  Diagnosis Date   Anemia 2006   Anginal pain (HCC)    Carotid artery disease ([place])    right ICA occlusion, 62-70% LICA 02/5008 [place]   CHF (congestive heart failure) ([place])    Chronic kidney disease    Coronary artery disease    Depression 2014   previously on amitryptiline    Diabetes mellitus with neurological manifestation ([place]) 2006   Diabetic peripheral neuropathy ([place]) 04/26/2020   Dysphagia    Dyspnea    Fracture of left ankle 1997   Gastroparesis 07/2016   Heart murmur    HOH (hard of hearing) 2004   Hyperlipidemia 2006   Hypertension 2006   IBS (irritable bowel syndrome) 2002   Leukopenia 2015   Macular degeneration 11/2019   Peripheral vascular disease ([place])    Shingles 2009   Stroke [facility]) 2020   Thyroid nodule 2004   Past Surgical History  Past Surgical History:  Procedure Laterality Date   A/V FISTULAGRAM Right 09/25/2022   Procedure: A/V Fistulagram;  Surgeon: Serafina Mitchell, MD;  Location: [place] [facility];  Service: Cardiovascular;  Laterality: Right;   ABDOMINAL HYSTERECTOMY  2005   AMPUTATION Bilateral 04/04/2022   Procedure: BILATERAL BELOW KNEE AMPUTATION;  Surgeon: Newt Minion, MD;  Location: [facility];  Service: Orthopedics;  Laterality: Bilateral;   AV FISTULA PLACEMENT Right 12/22/2021   Procedure: RIGHT ARM ARTERIOVENOUS (AV) BRACIOCPHELAIC FISTULA CREATION;  Surgeon: Waynetta Sandy, MD;  Location: [place];  Service: Vascular;  Laterality: Right;   AV FISTULA PLACEMENT Right 02/19/2022   Procedure: RIGHT ARTERIOVENOUS FISTULA;  Surgeon: Angelia Mould, MD;  Location: [place];  Service: Vascular;  Laterality: Right;   [place] Right 08/07/2022   Procedure: SECOND STAGE RIGHT BASILIC VEIN TRANSPOSITION;  Surgeon: Angelia Mould, MD;  Location: [place];  Service: Vascular;  Laterality: Right;   CATARACT EXTRACTION Left 11/2019   CESAREAN SECTION  1983    CHOLECYSTECTOMY N/A 03/05/2020   Procedure: LAPAROSCOPIC CHOLECYSTECTOMY WITH INTRAOPERATIVE CHOLANGIOGRAM;  Surgeon: Donnie Mesa, MD;  [facility]: [facility];  Service: General;  Laterality: N/A;   ESOPHAGOGASTRODUODENOSCOPY (EGD) WITH PROPOFOL Left 08/26/2014   Procedure: ESOPHAGOGASTRODUODENOSCOPY (EGD) WITH PROPOFOL;  Surgeon: Arta Silence, MD;  [facility];  Service: Endoscopy;  Laterality: Left;   ESOPHAGOGASTRODUODENOSCOPY (EGD) WITH PROPOFOL N/A 03/03/2020   Procedure: ESOPHAGOGASTRODUODENOSCOPY (EGD) WITH PROPOFOL;  Surgeon: Jerene Bears, MD;  [facility];  Service: Gastroenterology;  Laterality: N/A;   IR FLUORO GUIDE CV LINE RIGHT  02/15/2022   IR US GUIDE VASC ACCESS RIGHT  02/15/2022   PERIPHERAL VASCULAR BALLOON ANGIOPLASTY Right 09/25/2022   Procedure: PERIPHERAL  VASCULAR BALLOON ANGIOPLASTY;  Surgeon: Serafina Mitchell, MD;  Location: [place] [facility];  Service: Cardiovascular;  Laterality: Right;  AVF   RIGHT HEART CATH N/A 07/14/2021   Procedure: RIGHT HEART CATH;  Surgeon: Larey Dresser, MD;  Location: [place] [facility];  Service: Cardiovascular;  Laterality: N/A;   RIGHT/LEFT HEART CATH AND CORONARY ANGIOGRAPHY N/A 04/27/2021   Procedure: RIGHT/LEFT HEART CATH AND  CORONARY ANGIOGRAPHY;  Surgeon: [name], MD;  Location: [place] [facility];  Service: Cardiovascular;  Laterality: N/A;   Family History  Family History  Problem Relation Age of Onset   Hypertension Mother    Heart disease Mother    Diabetes Mother    Thyroid disease Mother    Congestive Heart Failure Mother    Breast cancer Maternal Grandmother    Colon cancer Maternal Grandfather    Heart attack Sister    Heart disease Brother    Hyperlipidemia Brother    Hypertension Brother    Diabetes Father    Breast cancer Maternal Aunt    Social History  reports that she has quit smoking. Her smoking use included cigarettes. She has a 0.13 pack-year smoking history. She has never been exposed to tobacco smoke. She has never used smokeless tobacco. She reports that she does not drink alcohol and does not use drugs. Allergies  Allergies  Allergen Reactions   Hydralazine Other (See Comments)   Hydralazine Hcl Other (See Comments)    Hair loss   Hydrocodone Itching and Other (See Comments)    Upset stomach   Metformin And Related Nausea And Vomiting and Other (See Comments)    Stomach pains, also   Other Nausea Only and Other (See Comments)    Lettuce- Does not digest this!!   Plaquenil [Hydroxychloroquine Sulfate] Hives   Shellfish Allergy Nausea And Vomiting   Shellfish-Derived Products Nausea Only and Other (See Comments)    Caused an upset stomach   Shrimp (Diagnostic) Nausea Only and Other (See Comments)    Upset stomach    Sulfa Antibiotics Hives   Home medications Prior to Admission medications   Medication Sig Start Date End Date Taking? Authorizing Provider  acetaminophen (TYLENOL) 500 MG tablet Take by mouth. 02/27/22  Yes [provider]  insulin detemir (LEVEMIR FLEXPEN) 100 UNIT/ML FlexPen Inject into the skin. 02/27/22  Yes [provider]  Methoxy PEG-Epoetin Beta (MIRCERA IJ) Mircera 10/31/22 10/30/23 Yes [provider]  risperiDONE  (RISPERDAL) 3 MG tablet Take by mouth. 02/27/22  Yes [provider]  amLODipine (NORVASC) 10 MG tablet Take 1 tablet (10 mg total) by mouth daily. 06/04/22   Shelly Coss, MD  aspirin 81 MG EC tablet Take 1 tablet (81 mg total) by mouth daily. 09/26/21   [place]ford, Maricela Bo, FNP  atorvastatin (LIPITOR) 40 MG tablet Take 1 tablet (40 mg total) by mouth daily. 11/07/21   Larey Dresser, MD  Blood Glucose Monitoring Suppl [facility] VERIO) w/Device KIT Check blood sugar three times daily. E11.40 05/06/20   [name], MD  calcium acetate (PHOSLO) 667 MG capsule TAKE 1 CAPSULE (667 MG TOTAL) BY MOUTH 3 (THREE) TIMES DAILY WITH MEALS. 02/08/22   [name], MD  Continuous Blood Gluc Receiver (DEXCOM G6 RECEIVER) DEVI 1 Device by Does not apply route daily. 11/15/21   [name], MD  Continuous Blood Gluc Sensor (DEXCOM G6 SENSOR) MISC 1 packet by Does not apply route daily. 11/15/21   [name], MD  Continuous Blood Gluc Transmit (DEXCOM G6 TRANSMITTER) MISC 1 packet by Does not apply route daily. 11/15/21   [name], MD  ezetimibe (ZETIA) 10 MG tablet Take 1 tablet (10 mg total) by mouth daily. 04/11/22   Danford, Suann Larry, MD  ferrous sulfate 325 (65 FE) MG tablet Take 1 tablet (325 mg total) by mouth daily with breakfast. 11/07/21   [name], MD  glucose blood test strip Use as instructedCheck blood sugar three times daily. E11.40 07/11/20   [name], MD  HYDROmorphone (DILAUDID) 2 MG tablet Take 1-1.5 tablets (2-3 mg total) by mouth every 4 (four) hours as needed for severe pain (pain score 7-10). 04/10/22   Danford, Suann Larry, MD  insulin glargine (LANTUS) 100 UNIT/ML Solostar Pen Inject 10 Units into the skin at bedtime. 04/10/22   Danford, Suann Larry, MD  insulin lispro (HUMALOG KWIKPEN) 100 UNIT/ML KwikPen Inject 3 units before breakfast and dinner. 04/10/22   Danford, Suann Larry, MD  Insulin Pen Needle 32G X 4 MM  MISC Use to inject insulin as directed. 11/07/21   [name], MD  isosorbide mononitrate (IMDUR) 30 MG 24 hr tablet Take 1 tablet (30 mg total) by mouth daily. 06/04/22   Shelly Coss, MD  labetalol (NORMODYNE) 300 MG tablet Take 1 tablet (300 mg total) by mouth 2 (two) times daily. 06/04/22   Shelly Coss, MD  multivitamin (RENA-VIT) TABS tablet Take 1 tablet by mouth at bedtime. 04/10/22   Danford, Suann Larry, MD  NOVOLOG FLEXPEN 100 UNIT/ML FlexPen Inject 3 Units into the skin in the morning and at bedtime. 06/04/22   [provider]  ondansetron (ZOFRAN) 8 MG tablet Take 1 tablet (8 mg total) by mouth every 8 (eight) hours as needed for nausea 05/23/22     oxyCODONE-acetaminophen (PERCOCET) 5-325 MG tablet Take 1-2 tablets by mouth every 6 (six) hours as needed for severe pain. 08/07/22 08/07/23  Schuh, McKenzi P, PA-C  pantoprazole (PROTONIX) 40 MG tablet Take 1 tablet (40 mg total) by mouth daily. 04/11/22   Danford, Suann Larry, MD  polyethylene glycol (MIRALAX / GLYCOLAX) 17 g packet Take 17 g by mouth daily. 06/04/22   Shelly Coss, MD  promethazine (PHENERGAN) 25 MG tablet Take 25-50 mg by mouth every 8 (eight) hours as needed for vomiting or nausea. 04/23/22   [provider]  sevelamer (RENAGEL) 800 MG tablet Take 1,600 mg by mouth 3 (three) times daily. 07/06/22   [provider]  sevelamer carbonate (RENVELA) 800 MG tablet Take 1,600 mg by mouth 3 (three) times daily.    [provider]  zinc sulfate 220 (50 Zn) MG capsule Take 1 capsule (220 mg total) by mouth daily. 04/11/22   Danford, Suann Larry, MD  Insulin NPH Isophane & Regular (RELION 70/30 [place]) Inject 35 Units into the skin 2 (two) times daily.  08/17/14  [provider]     Vitals:   11/04/22 0245 11/04/22 0400 11/04/22 0445 11/04/22 0516  BP: (!) 166/68 (!) 169/79 (!) 162/81   Pulse: 78 77 81   Resp: _0 Temp:    97.9 F (36.6 C)  TempSrc:      SpO2: 100%  100% 100%    Exam Gen alert, mild ^wob, no distress No rash, cyanosis or gangrene Sclera anicteric, throat clear  No jvd or bruits Chest dec'd BS at bases RRR no MRG Abd soft ntnd no mass or ascites +bs GU defer MS no  joint effusions or deformity Ext no LE or UE edema, no wounds or ulcers Neuro is alert, Ox 3 , nf    TDC RIJ intact, maturing RUA AVF+bruit    Home meds include - norvasc 10, aspiring, lipitor, phoslo 1 ac tid, zetia, insulin glargine, labetalol 300 bid, renavite, protonix, sevelamer 2 ac tid   OP HD: MWF [facility]  4h 64mn  400/1.5  98kg  2/2 bath  TDC/ maturing RUA AVF   Hep 5000+ 30067mrun - last HD 11/15, post wt 97.3kg - may be losing body wt - calcitriol 0.25 po tiw - venofer 100 tiw thru 12/06 - mircera 150 q2, last 11/15, due 11/30    Na 135  K 4.0  creat 6.8  Ca 8.5  alb 3.6   Assessment/ Plan:  Resp distress - likely pulm edema, pt missed her last HD on 11/17. SP bipap in ED, had HD this am on 4L [place] w/ 4 L UF. Next HD would be Tuesday. Will reassess her tomorrow.  ESRD - HD MWF. HD today as above on holiday schedule.  BP/ volume - BP's a bit high, vol overload, prob losing body wt, coming off under dry wt a bit at OP unit.  Anemia esrd - Hb 11-12, no esa needs.  MBD ckd - CCa in range, cont binder.  PAD - bilat amputee, bedbound Deconditioning - lives at SNF H/o CVA DM2 - per pmd    RoKelly SplinterMD 11/04/2022, 6:35 AM Recent Labs  Lab 11/04/22 0117 11/04/22 0134 11/04/22 0357  HGB 11.4* 12.6 11.7*  ALBUMIN 3.6  --   --   CALCIUM 8.5*  --   --   CREATININE 6.87*  --  6.89*  K 4.3 4.0  --    Inpatient medications:  amLODipine  10 mg Oral Daily   atorvastatin  40 mg Oral Daily   ezetimibe  10 mg Oral Daily   heparin  5,000 Units Subcutaneous Q8H   insulin glargine-yfgn  10 Units Subcutaneous QHS   isosorbide mononitrate  30 mg Oral Daily   labetalol  300 mg Oral BID   multivitamin  1 tablet Oral QHS   pantoprazole  40 mg Oral Daily    sevelamer carbonate  1,600 mg Oral TID WC   sodium chloride flush  3 mL Intravenous Q12H    sodium chloride     sodium chloride, acetaminophen, ipratropium-albuterol, sodium chloride flush

## 2022-11-04 NOTE — Progress Notes (Signed)
   11/04/22 1830  Vitals  Temp 99.4 F (37.4 C)  Temp Source Axillary  BP (!) 148/64  MAP (mmHg) 87  BP Location Left Arm  BP Method Automatic  Patient Position (if appropriate) Lying  Pulse Rate 83  Pulse Rate Source Monitor  ECG Heart Rate 82  Resp 14  Level of Consciousness  Level of Consciousness Alert  MEWS COLOR  MEWS Score Color Green  Oxygen Therapy  SpO2 100 %  O2 Device Nasal Cannula  O2 Flow Rate (L/min) 4 L/min  Pain Assessment  Pain Scale 0-10  Pain Score 0  Glasgow Coma Scale  Eye Opening 4  Best Verbal Response (NON-intubated) 5  Best Motor Response 6  Glasgow Coma Scale Score 15  MEWS Score  MEWS Temp 0  MEWS Systolic 0  MEWS Pulse 0  MEWS RR 0  MEWS LOC 0  MEWS Score 0   Pt admitted to [facility] notified CHG bath completed VSS Call bell within reach Bed alarm on Per report, pt legs are at facility, not with the patient while impatient

## 2022-11-04 NOTE — Progress Notes (Signed)
This is a pleasant 61 year old lady with many comorbidities presented to ED last night with shortness of breath and was diagnosed with acute respiratory failure with hypoxia secondary to fluid overload due to missed hemodialysis as she is ESRD patient which is hemodialysis dependent.  Patient seen and examined in dialysis unit, she was receiving her dialysis.  She said that she was feeling better.  On exam, she had very faint crackles at the bases.  She was requiring 4 L of oxygen.  Blood pressure slightly elevated and hope that that will improve after hemodialysis as well.  Continue rest of the management.

## 2022-11-04 NOTE — Progress Notes (Signed)
   11/04/22 1215  Vitals  Temp (!) 97.3 F (36.3 C)  Temp Source Oral  BP (!) 179/151  MAP (mmHg) 81  BP Location Right Arm  BP Method Automatic  Patient Position (if appropriate) Lying  Pulse Rate 81  Pulse Rate Source Monitor  Resp 18  Oxygen Therapy  SpO2 100 %  O2 Device Nasal Cannula  O2 Flow Rate (L/min) 4 L/min  Post Treatment  Dialyzer Clearance Clear  Duration of HD Treatment -hour(s) 3.45 hour(s)  Hemodialysis Intake (mL) 0 mL  Liters Processed 81.1  Fluid Removed (mL) 3500 mL  Tolerated HD Treatment Yes  Post-Hemodialysis Comments tolerated well gola met no s/s of distress to note  Note  Observations no problems to note remain alert states she feels better

## 2022-11-04 NOTE — Procedures (Signed)
   I was present at this dialysis session, have reviewed the session itself and made  appropriate changes Kelly Splinter MD [place] pager [phone]   11/04/2022, 9:05 AM

## 2022-11-04 NOTE — ED Notes (Signed)
Per Hemodiaylsis Nurse Sanjuan Dame there were no complications at dialysis. Pt got 3.5 L of fluids off, Benadryl and Tylenol were given during tx.

## 2022-11-05 DIAGNOSIS — J9601 Acute respiratory failure with hypoxia: Secondary | ICD-10-CM | POA: Diagnosis not present

## 2022-11-05 LAB — BASIC METABOLIC PANEL
Anion gap: 14 (ref 5–15)
BUN: 23 mg/dL (ref 8–23)
CO2: 28 mmol/L (ref 22–32)
Calcium: 8.9 mg/dL (ref 8.9–10.3)
Chloride: 95 mmol/L — ABNORMAL LOW (ref 98–111)
Creatinine, Ser: 4.66 mg/dL — ABNORMAL HIGH (ref 0.44–1.00)
GFR, Estimated: 10 mL/min — ABNORMAL LOW (ref >60)
Glucose, Bld: 133 mg/dL — ABNORMAL HIGH (ref 70–99)
Potassium: 4.3 mmol/L (ref 3.5–5.1)
Sodium: 137 mmol/L (ref 135–145)

## 2022-11-05 MED ORDER — HYDROMORPHONE HCL 2 MG PO TABS: 2.0000 mg | ORAL_TABLET | ORAL | 0 refills | Status: DC | PRN

## 2022-11-05 MED ORDER — HYDRALAZINE HCL 25 MG PO TABS: 25.0000 mg | ORAL_TABLET | Freq: Four times a day (QID) | ORAL | Status: DC | PRN

## 2022-11-05 NOTE — Progress Notes (Signed)
Admit: 11/04/2022 LOS: 1  50F ESRD on iHD with AHRF after missing HD 11/02/22  Subjective:  HD yesterday: 3.5L UF, now on RA, BPs mildly elevated Feels well, no c/o this AM, dyspnea resolved Uses [place] supplemental at home  11/19 0701 - 11/20 0700 In: -  Out: [place] [Urine:250]  Filed Weights   11/05/22 0500  Weight: 91 kg    Scheduled Meds:  amLODipine  10 mg Oral Daily   atorvastatin  40 mg Oral Daily   Chlorhexidine Gluconate Cloth  6 each Topical Q0600   ezetimibe  10 mg Oral Daily   heparin  5,000 Units Subcutaneous Q8H   insulin glargine-yfgn  10 Units Subcutaneous QHS   isosorbide mononitrate  30 mg Oral Daily   labetalol  300 mg Oral BID   multivitamin  1 tablet Oral QHS   pantoprazole  40 mg Oral Daily   sevelamer carbonate  1,600 mg Oral TID WC   sodium chloride flush  3 mL Intravenous Q12H   Continuous Infusions:  sodium chloride     PRN Meds:.sodium chloride, acetaminophen, ipratropium-albuterol, sodium chloride flush  Current Labs: reviewed   Physical Exam:  Blood pressure (!) 160/68, pulse 87, temperature 98.8 F (37.1 C), temperature source Oral, resp. rate 14, weight 91 kg, SpO2 100 %. NAD, awke, alert, pleasant Nl WOB< no wrr Regular R IJ TDC, RUE AVF +B/T Nonfocal, AAO x3 S/nd/nd   OP HD: MWF [facility]  4h 28mn  400/1.5  98kg  2/2 bath  TDC/ maturing RUA AVF   Hep 5000+ 30047mrun - last HD 11/15, post wt 97.3kg - may be losing body wt - calcitriol 0.25 po tiw - venofer 100 tiw thru 12/06 - mircera 150 q2, last 11/15, due 11/30    A Assessment/ Plan:  AHRF at admit 11/19 - likely pulm edema, pt missed her last HD on 11/17. HD 3.5L UF 11/19 with improvement in status.  Back to baseline ESRD - HD MWF. HD today as above on holiday schedule. Next Tx 11/21 either here or as outpt if discharged.  Pt aware of holiday schedule BP/ volume - WIll need to push down EDW at discharge Anemia esrd - Hb 11-12, no esa needs.  MBD ckd - CCa in range, cont binder.   PAD - bilat amputee, bedbound Deconditioning - lives at SNF H/o CVA DM2 - per pmd  P As above, likely to discharge today Medication Issues; Preferred narcotic agents for pain control are hydromorphone, fentanyl, and methadone. Morphine should not be used.  Baclofen should be avoided Avoid oral sodium phosphate and magnesium citrate based laxatives / bowel preps    RyPearson GrippeD 11/05/2022, 9:37 AM  Recent Labs  Lab 11/04/22 0117 11/04/22 0134 11/04/22 0357 11/05/22 0109  NA 136 135  --  137  K 4.3 4.0  --  4.3  CL 96*  --   --  95*  CO2 27  --   --  28  GLUCOSE 112*  --   --  133*  BUN 33*  --   --  23  CREATININE 6.87*  --  6.89* 4.66*  CALCIUM 8.5*  --   --  8.9   Recent Labs  Lab 11/04/22 0117 11/04/22 0134 11/04/22 0357  WBC 9.7  --  12.3*  NEUTROABS 7.1  --  9.8*  HGB 11.4* 12.6 11.7*  HCT 38.7 37.0 39.5  MCV 91.9  --  91.0  PLT 230  --  251

## 2022-11-05 NOTE — Progress Notes (Signed)
Heart Failure Navigator Progress Note  Assessed for Heart & Vascular TOC clinic readiness.  Patient does not meet criteria due to ESRD on hemodialysis .     Earnestine Leys, BSN, Clinical cytogeneticist Only

## 2022-11-05 NOTE — TOC Transition Note (Signed)
Transition of Care [facility]) - CM/SW Discharge Note   Patient Details  Name: Nancy [name] MRN: [id number] Date of Birth: Apr 18, 1961  [facility] of [facility] CM/SW Contact:  Joanne Chars, LCSW Phone Number: 11/05/2022, 2:30 PM   Clinical Narrative:   Pt discharging to [facility].  RN call [phone] for report.   1330: CSW informed pt ready for DC back to [facility].  CSW confirmed with Emily/[facility] that pt is LTC, they are ready to receive her.  CSW spoke with pt in room, confirmed she is planning to return to [facility].  CSW spoke with Tracy/renal and pt outpt HD is in place, CSW confirmed with Raquel Sarna that pt will resume outpt HD tomorrow.  CSW spoke with pt husband and he is aware of DC back to [facility] today.    Final next level of care: Skilled Nursing Facility Barriers to Discharge: No Barriers Identified   Patient Goals and CMS Choice Patient states their goals for this hospitalization and ongoing recovery are:: walk with prosthetics   Choice offered to / list presented to : Patient  Discharge Placement              Patient chooses bed at:  [facility]) Patient to be transferred to facility by: [place] Name of family member notified: husband [name] Patient and family notified of of transfer: 11/05/22  Discharge Plan and Services In-house Referral: Clinical Social Work   Post Acute Care Choice: [place]                               Social Determinants of Health (SDOH) Interventions     Readmission Risk Interventions    02/16/2022   11:55 AM 09/21/2021    9:05 AM 03/07/2020    2:36 PM  Readmission Risk Prevention Plan  Transportation Screening Complete Complete Complete  PCP or Specialist Appt within 3-5 Days   Complete  Social Work Consult for [place] Planning/Counseling   Complete  Palliative Care Screening   Not Applicable  Medication Review Press photographer) Complete Complete Complete  PCP or Specialist  appointment within 3-5 days of discharge  Complete   HRI or [place]  Complete   SW Recovery Care/Counseling Consult  Complete   [place]  Not Applicable

## 2022-11-05 NOTE — NC FL2 (Signed)
[place] LEVEL OF CARE SCREENING TOOL     IDENTIFICATION  Patient Name: Nancy [name] Birthdate: [birth year] Sex: female Admission Date (Current Location): 11/04/2022  [place] and [place] Number:  Kathleen Argue [phone] [place] and Address:  [facility], [place] [address], [place], [place] [address]      Provider Number: [id number]  Attending Physician Name and Address:  Darliss Cheney, MD  Relative Name and Phone Number:  Chrisha, Vogel [phone]    Current Level of Care: Hospital Recommended Level of Care: [facility] Prior Approval Number:    Date Approved/Denied:   PASRR Number: [id number]  Discharge Plan: SNF    Current Diagnoses: Patient Active Problem List   Diagnosis Date Noted   Acute respiratory failure with hypoxia ([place]) 11/04/2022   Pain, unspecified 12/45/8099   Complication of vascular dialysis catheter 08/07/2022   Chest pain 05/30/2022   Fluid overload 05/30/2022   Acute pulmonary edema ([place]) 05/30/2022   Iron deficiency anemia, unspecified 04/26/2022   Moderate protein-calorie malnutrition ([place]) 04/18/2022   Nausea 04/10/2022   Hyponatremia 04/04/2022   Prolonged QT interval 04/04/2022   Anaphylactic shock, unspecified, initial encounter 02/23/2022   Coagulation defect, unspecified ([place]) 02/23/2022   Other allergy, initial encounter 02/23/2022   Pressure injury of skin 02/19/2022   Anemia in chronic kidney disease 02/16/2022   Dependence on renal dialysis ([place]) 02/16/2022   Hyperlipidemia, unspecified 02/16/2022   Hypertensive heart and chronic kidney disease with heart failure and with stage 5 chronic kidney disease, or end stage renal disease ([place]) 02/16/2022   Personal history of nicotine dependence 02/16/2022   Personal history of transient ischemic attack (TIA), and cerebral infarction without residual deficits 02/16/2022   Secondary hyperparathyroidism of renal origin ([place])  02/16/2022   Type 2 diabetes mellitus with diabetic peripheral angiopathy without gangrene ([place]) 02/16/2022   Renal anasarca 02/14/2022   ESRD on dialysis ([place]) 02/14/2022   Hyperkalemia 02/14/2022   S/P arteriovenous (AV) fistula creation 12/22/2021   Critical limb ischemia of both lower extremities ([place]) 11/22/2021   Subacute osteomyelitis, right ankle and foot ([place])    Peripheral artery disease ([place]) 10/27/2021   Acute exacerbation of congestive heart failure ([place]) 10/24/2021   Acute cardiogenic pulmonary edema ([place]) 10/06/2021   CAD (coronary artery disease) 10/06/2021   Goals of care, counseling/discussion    Gangrene of left foot ([place])    Cellulitis of left foot    Diabetic foot ulcers ([place]) 09/04/2021   CHF (congestive heart failure) ([place]) 07/07/2021   Acute postoperative anemia due to expected blood loss superimposed on anemia of chronic renal insufficiency 06/30/2021   CKD (chronic kidney disease), stage IV (HCC) 06/11/2021   Chronic diastolic CHF (congestive heart failure) ([place]) 06/10/2021   Abnormal nuclear stress test    Dyspnea on exertion 03/07/2021   Orthopnea 02/25/2021   History of cerebrovascular accident (CVA) with residual deficit 05/06/2020   Diabetic peripheral neuropathy ([place]) 04/26/2020   AKI (acute kidney injury) ([place]) 04/20/2020   Generalized weakness 04/20/2020   Dehydration 04/20/2020   Generalized abdominal pain    Pancreatitis, acute 02/29/2020   Cerebrovascular accident (CVA) ([place]) 11/08/2019   Stenosis of right carotid artery 11/08/2019   Mixed diabetic hyperlipidemia associated with type 2 diabetes mellitus ([place]) 11/08/2019   Cortical age-related cataract of both eyes 08/30/2019   Gait abnormality 08/30/2019   Statin declined 08/30/2019   Gastroesophageal reflux disease without esophagitis 04/18/2018   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   Insulin dependent type 2 diabetes  mellitus ([place]) 10/29/2017   History of macular degeneration 10/29/2017   Chronic  cervical pain 10/29/2016   Myalgia 09/14/2016   Insomnia 09/14/2016   Tinea pedis of both feet 08/20/2016   Macular degeneration 08/17/2016   Low back pain 09/21/2014   Hypertensive urgency 09/21/2014   Diabetic gastroparesis associated with type 2 diabetes mellitus ([place]) 09/14/2014   HTN (hypertension) 09/08/2014   Thyroid nodule 08/17/2014   Morbid obesity ([place]) 08/17/2014   Hypokalemia 08/06/2014   Type II diabetes mellitus with neurological manifestations, uncontrolled 08/04/2014    Orientation RESPIRATION BLADDER Height & Weight     Self, Time, Situation, Place  Normal Continent, External catheter Weight: 200 lb 9.9 oz (91 kg) Height:     BEHAVIORAL SYMPTOMS/MOOD NEUROLOGICAL BOWEL NUTRITION STATUS      Continent Diet (see discharge summary)  AMBULATORY STATUS COMMUNICATION OF NEEDS Skin   Total Care Verbally Normal                       Personal Care Assistance Level of Assistance  Bathing, Feeding, Dressing Bathing Assistance: Maximum assistance Feeding assistance: Limited assistance Dressing Assistance: Maximum assistance     Functional Limitations Info  Sight, Hearing, Speech Sight Info: Adequate Hearing Info: Adequate Speech Info: Adequate    SPECIAL CARE FACTORS FREQUENCY                       Contractures Contractures Info: Not present    Additional Factors Info  Code Status, Allergies Code Status Info: full Allergies Info: Hydralazine, Hydralazine Hcl, Hydrocodone, Metformin And Related, Other, Plaquenil (Hydroxychloroquine Sulfate), Shellfish Allergy, Shellfish-derived Products, Shrimp (Diagnostic), Sulfa Antibiotics           Current Medications (11/05/2022):  This is the current hospital active medication list Current Facility-Administered Medications  Medication Dose Route Frequency Provider Last Rate Last Admin   0.9 %  sodium chloride infusion  250 mL Intravenous PRN Crosley, Debby, MD       acetaminophen (TYLENOL) tablet 650 mg   650 mg Oral Q4H PRN Crosley, Debby, MD   650 mg at 11/05/22 0924   amLODipine (NORVASC) tablet 10 mg  10 mg Oral Daily Crosley, Debby, MD   10 mg at 11/05/22 1109   atorvastatin (LIPITOR) tablet 40 mg  40 mg Oral Daily Crosley, Debby, MD   40 mg at 11/05/22 0924   Chlorhexidine Gluconate Cloth 2 % PADS 6 each  6 each Topical Q0600 Roney Jaffe, MD   6 each at 11/05/22 0505   ezetimibe (ZETIA) tablet 10 mg  10 mg Oral Daily Crosley, Debby, MD   10 mg at 11/05/22 0924   heparin injection 5,000 Units  5,000 Units Subcutaneous Q8H Crosley, Debby, MD   5,000 Units at 11/05/22 0505   insulin glargine-yfgn (SEMGLEE) injection 10 Units  10 Units Subcutaneous QHS Crosley, Debby, MD   10 Units at 11/04/22 2152   ipratropium-albuterol (DUONEB) 0.5-2.5 (3) MG/3ML nebulizer solution 3 mL  3 mL Nebulization Q4H PRN Crosley, Debby, MD       isosorbide mononitrate (IMDUR) 24 hr tablet 30 mg  30 mg Oral Daily Crosley, Debby, MD   30 mg at 11/05/22 1109   labetalol (NORMODYNE) tablet 300 mg  300 mg Oral BID Crosley, Debby, MD   300 mg at 11/05/22 1109   multivitamin (RENA-VIT) tablet 1 tablet  1 tablet Oral QHS Quintella Baton, MD   1 tablet at 11/04/22 2151   pantoprazole (PROTONIX) EC  tablet 40 mg  40 mg Oral Daily Crosley, Debby, MD   40 mg at 11/05/22 0924   sevelamer carbonate (RENVELA) tablet 1,600 mg  1,600 mg Oral TID WC Crosley, Debby, MD   1,600 mg at 11/05/22 1110   sodium chloride flush (NS) 0.9 % injection 3 mL  3 mL Intravenous Q12H Crosley, Debby, MD   3 mL at 11/05/22 1111   sodium chloride flush (NS) 0.9 % injection 3 mL  3 mL Intravenous PRN Quintella Baton, MD         Discharge Medications: Please see discharge summary for a list of discharge medications.  Relevant Imaging Results:  Relevant Lab Results:   Additional Information SSN: [id number]  Joanne Chars, LCSW

## 2022-11-05 NOTE — Care Management Important Message (Deleted)
Important Message  Patient Details  Name: Nancy [name] MRN: [id number] Date of Birth: 14-Oct-1961   Medicare Important Message Given:  Yes     Shelda Altes 11/05/2022, 11:12 AM

## 2022-11-05 NOTE — Progress Notes (Addendum)
Contacted by CSW that pt will return to snf today. Contacted [facility] and spoke to [place], Therapist, sports. Clinic advised pt will return to snf today and will resume care at clinic tomorrow on pt's holiday schedule. Pt's holiday schedule this week is Sun/Tues/Fri. CSW advised pt will need to go to HD tomorrow due to holiday schedule.   Melven Sartorius Renal Navigator [phone]

## 2022-11-05 NOTE — Discharge Summary (Signed)
Physician Discharge Summary  Nancy [name] ZJQ:[id number] DOB: 1961-01-19 DOA: 11/04/2022  PCP: [name], MD  Admit date: 11/04/2022 Discharge date: 11/05/2022 30 Day Unplanned Readmission Risk Score    Flowsheet Row ED to Hosp-Admission (Current) from 11/04/2022 in [facility] 4E CV SURGICAL PROGRESSIVE CARE  30 Day Unplanned Readmission Risk Score (%) 26.84 Filed at 11/05/2022 0801       This score is the patient's risk of an unplanned readmission within 30 days of being discharged (0 -100%). The score is based on dignosis, age, lab data, medications, orders, and past utilization.   Low:  0-14.9   Medium: 15-21.9   High: 22-29.9   Extreme: 30 and above          Admitted From: SNF Disposition: SNF  Recommendations for Outpatient Follow-up:  Follow up with PCP in 1-2 weeks Please obtain BMP/CBC in one week Resume your outpatient dialysis tomorrow. Please follow up with your PCP on the following pending results: Unresulted Labs (From admission, onward)     Start     Ordered   11/04/22 0644  Hepatitis B surface antibody,quantitative  (New Admission Hemo Labs (Hepatitis B))  Once,   R        11/04/22 0645   11/04/22 0324  CBC  (heparin)  Once,   R       Comments: Baseline for heparin therapy IF NOT ALREADY DRAWN.  Notify MD if PLT < 100 K.    11/04/22 0326              Home Health: None Equipment/Devices: None  Discharge Condition: Stable CODE STATUS: Full code Diet recommendation: Renal  Subjective: Seen and examined.  As well.  No complaints.  She is on 3 L oxygen which is her baseline requirement.  She is agreeable to go back to SNF today.  Brief/Interim Summary: This is a pleasant 61 year old lady with many comorbidities presented to ED with shortness of breath and was diagnosed with acute on chronic respiratory failure with hypoxia (she uses oxygen intermittently and often at night) secondary to fluid overload due to missed hemodialysis as she is ESRD  patient which is hemodialysis dependent.  She was in the hospital service, nephrology consulted, underwent her hemodialysis, feeling much better today, nephrology has cleared her for discharge, she typically gets her hemodialysis on Monday Wednesday Friday schedule however due to Thanksgiving week, her schedule has been switched to Tuesday and Friday this week, her next All[place] will be tomorrow.  Her blood pressure is also acceptable.  She has been discharged in stable condition.  Discharge plan was discussed with patient and/or family member and they verbalized understanding and agreed with it.  Discharge Diagnoses:  Principal Problem:   Acute respiratory failure with hypoxia ([place]) Active Problems:   Diabetic foot ulcers (HCC)   ESRD on dialysis ([place])   Insulin dependent type 2 diabetes mellitus ([place])   Morbid obesity ([place])   HTN (hypertension)   Diabetic gastroparesis associated with type 2 diabetes mellitus ([place])   History of macular degeneration   Gait abnormality   Cerebrovascular accident (CVA) ([place])   Mixed diabetic hyperlipidemia associated with type 2 diabetes mellitus ([place])   Peripheral artery disease ([place])   Renal anasarca   Fluid overload   Secondary hyperparathyroidism of renal origin [facility])    Discharge Instructions   Allergies as of 11/05/2022       Reactions   Hydralazine Other (See Comments)   Hydralazine Hcl Other (See Comments)   Hair  loss   Hydrocodone Itching, Other (See Comments)   Upset stomach   Metformin And Related Nausea And Vomiting, Other (See Comments)   Stomach pains, also   Other Nausea Only, Other (See Comments)   Lettuce- Does not digest this!!   Plaquenil [hydroxychloroquine Sulfate] Hives   Shellfish Allergy Nausea And Vomiting   Shellfish-derived Products Nausea Only, Other (See Comments)   Caused an upset stomach   Shrimp (diagnostic) Nausea Only, Other (See Comments)   Upset stomach    Sulfa Antibiotics Hives        Medication  List     STOP taking these medications    acetaminophen 500 MG tablet Commonly known as: TYLENOL   calcium acetate 667 MG capsule Commonly known as: PHOSLO   Levemir FlexPen 100 UNIT/ML FlexPen Generic drug: insulin detemir       TAKE these medications    amLODipine 10 MG tablet Commonly known as: NORVASC Take 1 tablet (10 mg total) by mouth daily.   Aspirin Low Dose 81 MG tablet Generic drug: aspirin EC Take 1 tablet (81 mg total) by mouth daily.   atorvastatin 40 MG tablet Commonly known as: LIPITOR Take 1 tablet (40 mg total) by mouth daily.   Dexcom G6 Receiver Devi 1 Device by Does not apply route daily.   Dexcom G6 Sensor Misc 1 packet by Does not apply route daily.   Dexcom G6 Transmitter Misc 1 packet by Does not apply route daily.   ezetimibe 10 MG tablet Commonly known as: ZETIA Take 1 tablet (10 mg total) by mouth daily.   ferrous sulfate 325 (65 FE) MG tablet Take 1 tablet (325 mg total) by mouth daily with breakfast.   glucose blood test strip Use as instructedCheck blood sugar three times daily. E11.40   HYDROmorphone 2 MG tablet Commonly known as: DILAUDID Take 1-1.5 tablets (2-3 mg total) by mouth every 4 (four) hours as needed for severe pain (pain score 7-10).   insulin glargine 100 UNIT/ML Solostar Pen Commonly known as: LANTUS Inject 10 Units into the skin at bedtime.   Insulin Pen Needle 32G X 4 MM Misc Use to inject insulin as directed.   isosorbide mononitrate 30 MG 24 hr tablet Commonly known as: IMDUR Take 1 tablet (30 mg total) by mouth daily.   labetalol 300 MG tablet Commonly known as: NORMODYNE Take 1 tablet (300 mg total) by mouth 2 (two) times daily.   multivitamin Tabs tablet Take 1 tablet by mouth at bedtime. What changed: when to take this   NovoLOG FlexPen 100 UNIT/ML FlexPen Generic drug: insulin aspart Inject 3 Units into the skin in the morning and at bedtime.   NyQuil HBP Cold & Flu 15-6.25-325 MG/15ML  Liqd Generic drug: DM-Doxylamine-Acetaminophen Take 30 mLs by mouth every 8 (eight) hours as needed (Cold).   ondansetron 8 MG tablet Commonly known as: ZOFRAN Take 1 tablet (8 mg total) by mouth every 8 (eight) hours as needed for nausea   OneTouch Verio w/Device Kit Check blood sugar three times daily. E11.40   pantoprazole 40 MG tablet Commonly known as: PROTONIX Take 1 tablet (40 mg total) by mouth daily.   polyethylene glycol 17 g packet Commonly known as: MIRALAX / GLYCOLAX Take 17 g by mouth daily.   promethazine 25 MG tablet Commonly known as: PHENERGAN Take 25-50 mg by mouth every 8 (eight) hours as needed for vomiting or nausea.   sevelamer 800 MG tablet Commonly known as: RENAGEL Take 1,600 mg by mouth  3 (three) times daily.   zinc sulfate 220 (50 Zn) MG capsule Take 1 capsule (220 mg total) by mouth daily.        Follow-up Information     [name], MD Follow up in 1 week(s).   Specialty: Internal Medicine Contact information: [address] [place] [address] [phone]                Allergies  Allergen Reactions   Hydralazine Other (See Comments)   Hydralazine Hcl Other (See Comments)    Hair loss   Hydrocodone Itching and Other (See Comments)    Upset stomach   Metformin And Related Nausea And Vomiting and Other (See Comments)    Stomach pains, also   Other Nausea Only and Other (See Comments)    Lettuce- Does not digest this!!   Plaquenil [Hydroxychloroquine Sulfate] Hives   Shellfish Allergy Nausea And Vomiting   Shellfish-Derived Products Nausea Only and Other (See Comments)    Caused an upset stomach   Shrimp (Diagnostic) Nausea Only and Other (See Comments)    Upset stomach    Sulfa Antibiotics Hives    Consultations: Nephrology   Procedures/Studies: DG Chest Port 1 View  Result Date: 11/04/2022 CLINICAL DATA:  Shortness of breath EXAM: PORTABLE CHEST 1 VIEW COMPARISON:  05/29/2022 FINDINGS:  Right dialysis catheter remains in place, unchanged. Cardiomegaly. Diffuse bilateral airspace disease, worsening since prior study, likely pulmonary edema. Low lung volumes. No visible effusions or acute bony abnormality. IMPRESSION: Worsening diffuse bilateral airspace disease, likely edema. Electronically Signed   By: Rolm Baptise M.D.   On: 11/04/2022 01:22     Discharge Exam: Vitals:   11/05/22 0011 11/05/22 0352  BP: (!) 166/78 (!) 160/68  Pulse:    Resp:    Temp: 99 F (37.2 C) 98.8 F (37.1 C)  SpO2:     Vitals:   11/04/22 2151 11/05/22 0011 11/05/22 0352 11/05/22 0500  BP: (!) 167/67 (!) 166/78 (!) 160/68   Pulse: 87     Resp:      Temp:  99 F (37.2 C) 98.8 F (37.1 C)   TempSrc:  Oral Oral   SpO2:      Weight:    91 kg    General: Pt is alert, awake, not in acute distress Cardiovascular: RRR, S1/S2 +, no rubs, no gallops Respiratory: CTA bilaterally, no wheezing, no rhonchi Abdominal: Soft, NT, ND, bowel sounds + Extremities: no edema, no cyanosis, bilateral BKA    The results of significant diagnostics from this hospitalization (including imaging, microbiology, ancillary and laboratory) are listed below for reference.     Microbiology: Recent Results (from the past 240 hour(s))  Resp Panel by RT-PCR (Flu A&B, Covid)     Status: None   Collection Time: 11/04/22  7:27 AM  Result Value Ref Range Status   SARS Coronavirus 2 by RT PCR NEGATIVE NEGATIVE Final    Comment: (NOTE) SARS-CoV-2 target nucleic acids are NOT DETECTED.  The SARS-CoV-2 RNA is generally detectable in upper respiratory specimens during the acute phase of infection. The lowest concentration of SARS-CoV-2 viral copies this assay can detect is 138 copies/mL. A negative result does not preclude SARS-Cov-2 infection and should not be used as the sole basis for treatment or other patient management decisions. A negative result may occur with  improper specimen collection/handling, submission  of specimen other than nasopharyngeal swab, presence of viral mutation(s) within the areas targeted by this assay, and inadequate number  of viral copies(<138 copies/mL). A negative result must be combined with clinical observations, patient history, and epidemiological information. The expected result is Negative.  Fact Sheet for Patients:  EntrepreneurPulse.com.au  Fact Sheet for Healthcare Providers:  IncredibleEmployment.be  This test is no t yet approved or cleared by the [place] FDA and  has been authorized for detection and/or diagnosis of SARS-CoV-2 by FDA under an Emergency Use Authorization (EUA). This EUA will remain  in effect (meaning this test can be used) for the duration of the COVID-19 declaration under Section 564(b)(1) of the Act, 21 U.S.C.section 360bbb-3(b)(1), unless the authorization is terminated  or revoked sooner.       Influenza A by PCR NEGATIVE NEGATIVE Final   Influenza B by PCR NEGATIVE NEGATIVE Final    Comment: (NOTE) The Xpert Xpress SARS-CoV-2/FLU/RSV plus assay is intended as an aid in the diagnosis of influenza from Nasopharyngeal swab specimens and should not be used as a sole basis for treatment. Nasal washings and aspirates are unacceptable for Xpert Xpress SARS-CoV-2/FLU/RSV testing.  Fact Sheet for Patients: EntrepreneurPulse.com.au  Fact Sheet for Healthcare Providers: IncredibleEmployment.be  This test is not yet approved or cleared by the [place] FDA and has been authorized for detection and/or diagnosis of SARS-CoV-2 by FDA under an Emergency Use Authorization (EUA). This EUA will remain in effect (meaning this test can be used) for the duration of the COVID-19 declaration under Section 564(b)(1) of the Act, 21 U.S.C. section 360bbb-3(b)(1), unless the authorization is terminated or revoked.  Performed at [facility], [place] [address]  [place], [place] [address]      Labs: BNP (last 3 results) Recent Labs    02/14/22 1500 05/30/22 0358 11/04/22 0117  BNP 176.4* 654.3* 814.4*   Basic Metabolic Panel: Recent Labs  Lab 11/04/22 0117 11/04/22 0134 11/04/22 0357 11/05/22 0109  NA 136 135  --  137  K 4.3 4.0  --  4.3  CL 96*  --   --  95*  CO2 27  --   --  28  GLUCOSE 112*  --   --  133*  BUN 33*  --   --  23  CREATININE 6.87*  --  6.89* 4.66*  CALCIUM 8.5*  --   --  8.9   Liver Function Tests: Recent Labs  Lab 11/04/22 0117  AST 16  ALT 13  ALKPHOS 108  BILITOT 0.8  PROT 7.2  ALBUMIN 3.6   No results for input(s): "LIPASE", "AMYLASE" in the last 168 hours. No results for input(s): "AMMONIA" in the last 168 hours. CBC: Recent Labs  Lab 11/04/22 0117 11/04/22 0134 11/04/22 0357  WBC 9.7  --  12.3*  NEUTROABS 7.1  --  9.8*  HGB 11.4* 12.6 11.7*  HCT 38.7 37.0 39.5  MCV 91.9  --  91.0  PLT 230  --  251   Cardiac Enzymes: No results for input(s): "CKTOTAL", "CKMB", "CKMBINDEX", "TROPONINI" in the last 168 hours. BNP: Invalid input(s): "POCBNP" CBG: No results for input(s): "GLUCAP" in the last 168 hours. D-Dimer No results for input(s): "DDIMER" in the last 72 hours. Hgb A1c No results for input(s): "HGBA1C" in the last 72 hours. Lipid Profile No results for input(s): "CHOL", "HDL", "LDLCALC", "TRIG", "CHOLHDL", "LDLDIRECT" in the last 72 hours. Thyroid function studies No results for input(s): "TSH", "T4TOTAL", "T3FREE", "THYROIDAB" in the last 72 hours.  Invalid input(s): "FREET3" Anemia work up No results for input(s): "VITAMINB12", "FOLATE", "FERRITIN", "TIBC", "IRON", "RETICCTPCT" in the  last 72 hours. Urinalysis    Component Value Date/Time   COLORURINE YELLOW 10/06/2021 0138   APPEARANCEUR CLEAR 10/06/2021 0138   LABSPEC 1.009 10/06/2021 0138   PHURINE 5.0 10/06/2021 0138   GLUCOSEU 50 (A) 10/06/2021 0138   HGBUR MODERATE (A) 10/06/2021 0138   BILIRUBINUR NEGATIVE 10/06/2021  0138   BILIRUBINUR negative 12/26/2017 1458   KETONESUR NEGATIVE 10/06/2021 0138   PROTEINUR >=300 (A) 10/06/2021 0138   UROBILINOGEN 0.2 12/26/2017 1458   UROBILINOGEN 0.2 09/07/2014 0857   NITRITE NEGATIVE 10/06/2021 0138   LEUKOCYTESUR NEGATIVE 10/06/2021 0138   Sepsis Labs Recent Labs  Lab 11/04/22 0117 11/04/22 0357  WBC 9.7 12.3*   Microbiology Recent Results (from the past 240 hour(s))  Resp Panel by RT-PCR (Flu A&B, Covid)     Status: None   Collection Time: 11/04/22  7:27 AM  Result Value Ref Range Status   SARS Coronavirus 2 by RT PCR NEGATIVE NEGATIVE Final    Comment: (NOTE) SARS-CoV-2 target nucleic acids are NOT DETECTED.  The SARS-CoV-2 RNA is generally detectable in upper respiratory specimens during the acute phase of infection. The lowest concentration of SARS-CoV-2 viral copies this assay can detect is 138 copies/mL. A negative result does not preclude SARS-Cov-2 infection and should not be used as the sole basis for treatment or other patient management decisions. A negative result may occur with  improper specimen collection/handling, submission of specimen other than nasopharyngeal swab, presence of viral mutation(s) within the areas targeted by this assay, and inadequate number of viral copies(<138 copies/mL). A negative result must be combined with clinical observations, patient history, and epidemiological information. The expected result is Negative.  Fact Sheet for Patients:  EntrepreneurPulse.com.au  Fact Sheet for Healthcare Providers:  IncredibleEmployment.be  This test is no t yet approved or cleared by the [place] FDA and  has been authorized for detection and/or diagnosis of SARS-CoV-2 by FDA under an Emergency Use Authorization (EUA). This EUA will remain  in effect (meaning this test can be used) for the duration of the COVID-19 declaration under Section 564(b)(1) of the Act, 21 U.S.C.section  360bbb-3(b)(1), unless the authorization is terminated  or revoked sooner.       Influenza A by PCR NEGATIVE NEGATIVE Final   Influenza B by PCR NEGATIVE NEGATIVE Final    Comment: (NOTE) The Xpert Xpress SARS-CoV-2/FLU/RSV plus assay is intended as an aid in the diagnosis of influenza from Nasopharyngeal swab specimens and should not be used as a sole basis for treatment. Nasal washings and aspirates are unacceptable for Xpert Xpress SARS-CoV-2/FLU/RSV testing.  Fact Sheet for Patients: EntrepreneurPulse.com.au  Fact Sheet for Healthcare Providers: IncredibleEmployment.be  This test is not yet approved or cleared by the [place] FDA and has been authorized for detection and/or diagnosis of SARS-CoV-2 by FDA under an Emergency Use Authorization (EUA). This EUA will remain in effect (meaning this test can be used) for the duration of the COVID-19 declaration under Section 564(b)(1) of the Act, 21 U.S.C. section 360bbb-3(b)(1), unless the authorization is terminated or revoked.  Performed at [facility], [place] [address] [place], [place] [address]      Time coordinating discharge: Over 30 minutes  SIGNED:   Darliss Cheney, MD  [facility] 11/05/2022, 10:02 AM *Please note that this is a verbal dictation therefore any spelling or grammatical errors are due to the "[place] [employer]" system interpretation. If 7PM-7AM, please contact night-coverage [email or web address]

## 2022-11-06 LAB — HEPATITIS B SURFACE ANTIBODY, QUANTITATIVE: Hep B S AB Quant (Post): 19.3 m[IU]/mL (ref >9.9)

## 2022-11-18 ENCOUNTER — Emergency Department (HOSPITAL_COMMUNITY): Payer: Medicare Other

## 2022-11-18 ENCOUNTER — Inpatient Hospital Stay (HOSPITAL_COMMUNITY)
Admission: EM | Admit: 2022-11-18 | Discharge: 2022-11-22 | DRG: 291 | Disposition: A | Discharge status: Skilled Nursing Facility | Payer: Medicare Other | Source: Skilled Nursing Facility | Attending: Internal Medicine | Admitting: Internal Medicine

## 2022-11-18 ENCOUNTER — Encounter (HOSPITAL_COMMUNITY): Payer: Self-pay | Admitting: Internal Medicine

## 2022-11-18 ENCOUNTER — Other Ambulatory Visit: Payer: Self-pay

## 2022-11-18 DIAGNOSIS — Z89511 Acquired absence of right leg below knee: Secondary | ICD-10-CM

## 2022-11-18 DIAGNOSIS — N186 End stage renal disease: Secondary | ICD-10-CM | POA: Diagnosis present

## 2022-11-18 DIAGNOSIS — Z83438 Family history of other disorder of lipoprotein metabolism and other lipidemia: Secondary | ICD-10-CM

## 2022-11-18 DIAGNOSIS — Z8349 Family history of other endocrine, nutritional and metabolic diseases: Secondary | ICD-10-CM

## 2022-11-18 DIAGNOSIS — I132 Hypertensive heart and chronic kidney disease with heart failure and with stage 5 chronic kidney disease, or end stage renal disease: Principal | ICD-10-CM | POA: Diagnosis present

## 2022-11-18 DIAGNOSIS — I501 Left ventricular failure: Secondary | ICD-10-CM | POA: Diagnosis present

## 2022-11-18 DIAGNOSIS — Z87891 Personal history of nicotine dependence: Secondary | ICD-10-CM

## 2022-11-18 DIAGNOSIS — J810 Acute pulmonary edema: Secondary | ICD-10-CM | POA: Diagnosis not present

## 2022-11-18 DIAGNOSIS — K5900 Constipation, unspecified: Secondary | ICD-10-CM | POA: Diagnosis present

## 2022-11-18 DIAGNOSIS — E785 Hyperlipidemia, unspecified: Secondary | ICD-10-CM | POA: Diagnosis present

## 2022-11-18 DIAGNOSIS — J9600 Acute respiratory failure, unspecified whether with hypoxia or hypercapnia: Principal | ICD-10-CM

## 2022-11-18 DIAGNOSIS — Z79899 Other long term (current) drug therapy: Secondary | ICD-10-CM

## 2022-11-18 DIAGNOSIS — I5033 Acute on chronic diastolic (congestive) heart failure: Secondary | ICD-10-CM | POA: Diagnosis present

## 2022-11-18 DIAGNOSIS — Z885 Allergy status to narcotic agent status: Secondary | ICD-10-CM

## 2022-11-18 DIAGNOSIS — Z888 Allergy status to other drugs, medicaments and biological substances status: Secondary | ICD-10-CM

## 2022-11-18 DIAGNOSIS — Z794 Long term (current) use of insulin: Secondary | ICD-10-CM

## 2022-11-18 DIAGNOSIS — E1142 Type 2 diabetes mellitus with diabetic polyneuropathy: Secondary | ICD-10-CM | POA: Diagnosis present

## 2022-11-18 DIAGNOSIS — Z833 Family history of diabetes mellitus: Secondary | ICD-10-CM

## 2022-11-18 DIAGNOSIS — E1149 Type 2 diabetes mellitus with other diabetic neurological complication: Secondary | ICD-10-CM | POA: Diagnosis present

## 2022-11-18 DIAGNOSIS — Z992 Dependence on renal dialysis: Secondary | ICD-10-CM

## 2022-11-18 DIAGNOSIS — J9621 Acute and chronic respiratory failure with hypoxia: Secondary | ICD-10-CM | POA: Diagnosis present

## 2022-11-18 DIAGNOSIS — Z91013 Allergy to seafood: Secondary | ICD-10-CM

## 2022-11-18 DIAGNOSIS — E1165 Type 2 diabetes mellitus with hyperglycemia: Secondary | ICD-10-CM | POA: Diagnosis present

## 2022-11-18 DIAGNOSIS — Z882 Allergy status to sulfonamides status: Secondary | ICD-10-CM

## 2022-11-18 DIAGNOSIS — N184 Chronic kidney disease, stage 4 (severe): Secondary | ICD-10-CM | POA: Diagnosis present

## 2022-11-18 DIAGNOSIS — Z803 Family history of malignant neoplasm of breast: Secondary | ICD-10-CM

## 2022-11-18 DIAGNOSIS — G8929 Other chronic pain: Secondary | ICD-10-CM | POA: Diagnosis present

## 2022-11-18 DIAGNOSIS — I160 Hypertensive urgency: Secondary | ICD-10-CM | POA: Diagnosis present

## 2022-11-18 DIAGNOSIS — Z800 Family history of malignant neoplasm of digestive organs: Secondary | ICD-10-CM

## 2022-11-18 DIAGNOSIS — D631 Anemia in chronic kidney disease: Secondary | ICD-10-CM | POA: Diagnosis present

## 2022-11-18 DIAGNOSIS — I2510 Atherosclerotic heart disease of native coronary artery without angina pectoris: Secondary | ICD-10-CM | POA: Diagnosis present

## 2022-11-18 DIAGNOSIS — R509 Fever, unspecified: Secondary | ICD-10-CM | POA: Diagnosis not present

## 2022-11-18 DIAGNOSIS — I161 Hypertensive emergency: Secondary | ICD-10-CM | POA: Diagnosis present

## 2022-11-18 DIAGNOSIS — E1122 Type 2 diabetes mellitus with diabetic chronic kidney disease: Secondary | ICD-10-CM | POA: Diagnosis present

## 2022-11-18 DIAGNOSIS — E1151 Type 2 diabetes mellitus with diabetic peripheral angiopathy without gangrene: Secondary | ICD-10-CM | POA: Diagnosis present

## 2022-11-18 DIAGNOSIS — Z8249 Family history of ischemic heart disease and other diseases of the circulatory system: Secondary | ICD-10-CM

## 2022-11-18 DIAGNOSIS — Z8673 Personal history of transient ischemic attack (TIA), and cerebral infarction without residual deficits: Secondary | ICD-10-CM

## 2022-11-18 DIAGNOSIS — L299 Pruritus, unspecified: Secondary | ICD-10-CM | POA: Diagnosis present

## 2022-11-18 DIAGNOSIS — F1120 Opioid dependence, uncomplicated: Secondary | ICD-10-CM | POA: Diagnosis present

## 2022-11-18 DIAGNOSIS — I509 Heart failure, unspecified: Secondary | ICD-10-CM

## 2022-11-18 DIAGNOSIS — Z1152 Encounter for screening for COVID-19: Secondary | ICD-10-CM

## 2022-11-18 DIAGNOSIS — Z7982 Long term (current) use of aspirin: Secondary | ICD-10-CM

## 2022-11-18 DIAGNOSIS — Z89512 Acquired absence of left leg below knee: Secondary | ICD-10-CM

## 2022-11-18 HISTORY — DX: End stage renal disease: Z99.2

## 2022-11-18 HISTORY — DX: End stage renal disease: N18.6

## 2022-11-18 LAB — CBC WITH DIFFERENTIAL/PLATELET
Abs Immature Granulocytes: 0.03 10*3/uL (ref 0.00–0.07)
Basophils Absolute: 0.1 10*3/uL (ref 0.0–0.1)
Basophils Relative: 1 %
Eosinophils Absolute: 0.6 10*3/uL — ABNORMAL HIGH (ref 0.0–0.5)
Eosinophils Relative: 6 %
HCT: 36.9 % (ref 36.0–46.0)
Hemoglobin: 10.7 g/dL — ABNORMAL LOW (ref 12.0–15.0)
Immature Granulocytes: 0 %
Lymphocytes Relative: 11 %
Lymphs Abs: 1.2 10*3/uL (ref 0.7–4.0)
MCH: 27 pg (ref 26.0–34.0)
MCHC: 29 g/dL — ABNORMAL LOW (ref 30.0–36.0)
MCV: 92.9 fL (ref 80.0–100.0)
Monocytes Absolute: 0.6 10*3/uL (ref 0.1–1.0)
Monocytes Relative: 6 %
Neutro Abs: 8 10*3/uL — ABNORMAL HIGH (ref 1.7–7.7)
Neutrophils Relative %: 76 %
Platelets: 225 10*3/uL (ref 150–400)
RBC: 3.97 MIL/uL (ref 3.87–5.11)
RDW: 18.6 % — ABNORMAL HIGH (ref 11.5–15.5)
WBC: 10.5 10*3/uL (ref 4.0–10.5)
nRBC: 0 % (ref 0.0–0.2)

## 2022-11-18 LAB — COMPREHENSIVE METABOLIC PANEL
ALT: 9 U/L (ref 0–44)
AST: 23 U/L (ref 15–41)
Albumin: 3.5 g/dL (ref 3.5–5.0)
Alkaline Phosphatase: 89 U/L (ref 38–126)
Anion gap: 11 (ref 5–15)
BUN: 22 mg/dL (ref 8–23)
CO2: 28 mmol/L (ref 22–32)
Calcium: 8.8 mg/dL — ABNORMAL LOW (ref 8.9–10.3)
Chloride: 99 mmol/L (ref 98–111)
Creatinine, Ser: 4.69 mg/dL — ABNORMAL HIGH (ref 0.44–1.00)
GFR, Estimated: 10 mL/min — ABNORMAL LOW (ref >60)
Glucose, Bld: 195 mg/dL — ABNORMAL HIGH (ref 70–99)
Potassium: 4.6 mmol/L (ref 3.5–5.1)
Sodium: 138 mmol/L (ref 135–145)
Total Bilirubin: 1.1 mg/dL (ref 0.3–1.2)
Total Protein: 7 g/dL (ref 6.5–8.1)

## 2022-11-18 LAB — TROPONIN I (HIGH SENSITIVITY)
Troponin I (High Sensitivity): 66 ng/L — ABNORMAL HIGH (ref <18)
Troponin I (High Sensitivity): 75 ng/L — ABNORMAL HIGH (ref <18)

## 2022-11-18 LAB — GLUCOSE, CAPILLARY: Glucose-Capillary: 96 mg/dL (ref 70–99)

## 2022-11-18 LAB — PHOSPHORUS: Phosphorus: 3.9 mg/dL (ref 2.5–4.6)

## 2022-11-18 LAB — I-STAT ARTERIAL BLOOD GAS, ED
Acid-Base Excess: 5 mmol/L — ABNORMAL HIGH (ref 0.0–2.0)
Bicarbonate: 32.3 mmol/L — ABNORMAL HIGH (ref 20.0–28.0)
Calcium, Ion: 1.17 mmol/L (ref 1.15–1.40)
HCT: 35 % — ABNORMAL LOW (ref 36.0–46.0)
Hemoglobin: 11.9 g/dL — ABNORMAL LOW (ref 12.0–15.0)
O2 Saturation: 93 %
Patient temperature: 98.6
Potassium: 4.3 mmol/L (ref 3.5–5.1)
Sodium: 139 mmol/L (ref 135–145)
TCO2: 34 mmol/L — ABNORMAL HIGH (ref 22–32)
pCO2 arterial: 58.7 mmHg — ABNORMAL HIGH (ref 32–48)
pH, Arterial: 7.349 — ABNORMAL LOW (ref 7.35–7.45)
pO2, Arterial: 73 mmHg — ABNORMAL LOW (ref 83–108)

## 2022-11-18 LAB — RESP PANEL BY RT-PCR (FLU A&B, COVID) ARPGX2
Influenza A by PCR: NEGATIVE
Influenza B by PCR: NEGATIVE
SARS Coronavirus 2 by RT PCR: NEGATIVE

## 2022-11-18 LAB — CBG MONITORING, ED: Glucose-Capillary: 116 mg/dL — ABNORMAL HIGH (ref 70–99)

## 2022-11-18 MED ORDER — ATORVASTATIN CALCIUM 40 MG PO TABS
40.0000 mg | ORAL_TABLET | Freq: Every day | ORAL | Status: DC
  Administered 2022-11-19 – 2022-11-22 (×4): 40 mg via ORAL
  Filled 2022-11-18 (×4): qty 1

## 2022-11-18 MED ORDER — FERROUS SULFATE 325 (65 FE) MG PO TABS
325.0000 mg | ORAL_TABLET | Freq: Every day | ORAL | Status: DC
  Administered 2022-11-19 – 2022-11-22 (×4): 325 mg via ORAL
  Filled 2022-11-18 (×4): qty 1

## 2022-11-18 MED ORDER — AMLODIPINE BESYLATE 10 MG PO TABS
10.0000 mg | ORAL_TABLET | Freq: Every day | ORAL | Status: DC
  Administered 2022-11-20 – 2022-11-22 (×3): 10 mg via ORAL
  Filled 2022-11-18 (×3): qty 1

## 2022-11-18 MED ORDER — FUROSEMIDE 10 MG/ML IJ SOLN
80.0000 mg | Freq: Once | INTRAMUSCULAR | Status: AC
  Administered 2022-11-18: 80 mg via INTRAVENOUS
  Filled 2022-11-18: qty 8

## 2022-11-18 MED ORDER — POLYETHYLENE GLYCOL 3350 17 G PO PACK
17.0000 g | PACK | Freq: Every day | ORAL | Status: DC
  Administered 2022-11-20 – 2022-11-22 (×3): 17 g via ORAL
  Filled 2022-11-18 (×3): qty 1

## 2022-11-18 MED ORDER — SODIUM CHLORIDE 0.9% FLUSH: 3.0000 mL | INTRAVENOUS | Status: DC | PRN

## 2022-11-18 MED ORDER — HEPARIN SODIUM (PORCINE) 1000 UNIT/ML DIALYSIS: 4000.0000 [IU] | INTRAMUSCULAR | Status: DC | PRN

## 2022-11-18 MED ORDER — CHLORHEXIDINE GLUCONATE CLOTH 2 % EX PADS
6.0000 | MEDICATED_PAD | Freq: Every day | CUTANEOUS | Status: DC
  Administered 2022-11-19: 6 via TOPICAL

## 2022-11-18 MED ORDER — HEPARIN SODIUM (PORCINE) 1000 UNIT/ML DIALYSIS
4000.0000 [IU] | INTRAMUSCULAR | Status: DC | PRN
  Filled 2022-11-18: qty 4

## 2022-11-18 MED ORDER — PANTOPRAZOLE SODIUM 40 MG PO TBEC
40.0000 mg | DELAYED_RELEASE_TABLET | Freq: Every day | ORAL | Status: DC
  Administered 2022-11-19 – 2022-11-22 (×4): 40 mg via ORAL
  Filled 2022-11-18 (×4): qty 1

## 2022-11-18 MED ORDER — INSULIN ASPART 100 UNIT/ML IJ SOLN
0.0000 [IU] | Freq: Three times a day (TID) | INTRAMUSCULAR | Status: DC
  Administered 2022-11-21: 1 [IU] via SUBCUTANEOUS

## 2022-11-18 MED ORDER — SODIUM CHLORIDE 0.9% FLUSH
3.0000 mL | Freq: Two times a day (BID) | INTRAVENOUS | Status: DC
  Administered 2022-11-18 – 2022-11-22 (×7): 3 mL via INTRAVENOUS

## 2022-11-18 MED ORDER — EZETIMIBE 10 MG PO TABS
10.0000 mg | ORAL_TABLET | Freq: Every day | ORAL | Status: DC
  Administered 2022-11-19 – 2022-11-22 (×4): 10 mg via ORAL
  Filled 2022-11-18 (×4): qty 1

## 2022-11-18 MED ORDER — INSULIN GLARGINE-YFGN 100 UNIT/ML ~~LOC~~ SOLN
10.0000 [IU] | Freq: Every day | SUBCUTANEOUS | Status: DC
  Administered 2022-11-18 – 2022-11-22 (×5): 10 [IU] via SUBCUTANEOUS
  Filled 2022-11-18 (×5): qty 0.1

## 2022-11-18 MED ORDER — ONDANSETRON HCL 4 MG PO TABS
8.0000 mg | ORAL_TABLET | Freq: Three times a day (TID) | ORAL | Status: DC | PRN
  Administered 2022-11-22: 8 mg via ORAL
  Filled 2022-11-18: qty 2

## 2022-11-18 MED ORDER — DIPHENHYDRAMINE HCL 50 MG/ML IJ SOLN
25.0000 mg | Freq: Once | INTRAMUSCULAR | Status: AC
  Administered 2022-11-18: 50 mg via INTRAVENOUS
  Filled 2022-11-18: qty 1

## 2022-11-18 MED ORDER — HEPARIN SODIUM (PORCINE) 5000 UNIT/ML IJ SOLN
5000.0000 [IU] | Freq: Two times a day (BID) | INTRAMUSCULAR | Status: DC
  Administered 2022-11-18 – 2022-11-22 (×8): 5000 [IU] via SUBCUTANEOUS
  Filled 2022-11-18 (×8): qty 1

## 2022-11-18 MED ORDER — ASPIRIN 81 MG PO TBEC
81.0000 mg | DELAYED_RELEASE_TABLET | Freq: Every day | ORAL | Status: DC
  Administered 2022-11-19 – 2022-11-22 (×4): 81 mg via ORAL
  Filled 2022-11-18 (×4): qty 1

## 2022-11-18 MED ORDER — SODIUM CHLORIDE 0.9 % IV SOLN: 250.0000 mL | INTRAVENOUS | Status: DC | PRN

## 2022-11-18 MED ORDER — HYDROMORPHONE HCL 2 MG PO TABS
2.0000 mg | ORAL_TABLET | ORAL | Status: DC | PRN
  Administered 2022-11-19: 2 mg via ORAL
  Filled 2022-11-18: qty 1

## 2022-11-18 MED ORDER — ACETAMINOPHEN 325 MG PO TABS
650.0000 mg | ORAL_TABLET | ORAL | Status: DC | PRN
  Filled 2022-11-18: qty 2

## 2022-11-18 MED ORDER — SODIUM CHLORIDE 0.9 % IV SOLN
125.0000 mg | INTRAVENOUS | Status: AC
  Administered 2022-11-19 – 2022-11-21 (×2): 125 mg via INTRAVENOUS
  Filled 2022-11-18 (×3): qty 10

## 2022-11-18 MED ORDER — CALCITRIOL 0.25 MCG PO CAPS
0.2500 ug | ORAL_CAPSULE | ORAL | Status: DC
  Administered 2022-11-19 – 2022-11-21 (×2): 0.25 ug via ORAL
  Filled 2022-11-18 (×2): qty 1

## 2022-11-18 MED ORDER — SEVELAMER CARBONATE 800 MG PO TABS
800.0000 mg | ORAL_TABLET | Freq: Three times a day (TID) | ORAL | Status: DC
  Administered 2022-11-19 – 2022-11-22 (×7): 800 mg via ORAL
  Filled 2022-11-18 (×8): qty 1

## 2022-11-18 MED ORDER — ISOSORBIDE MONONITRATE ER 30 MG PO TB24: 30.0000 mg | ORAL_TABLET | Freq: Every day | ORAL | Status: DC

## 2022-11-18 MED ORDER — LABETALOL HCL 200 MG PO TABS
300.0000 mg | ORAL_TABLET | Freq: Two times a day (BID) | ORAL | Status: DC
  Administered 2022-11-19 – 2022-11-22 (×5): 300 mg via ORAL
  Filled 2022-11-18 (×5): qty 1

## 2022-11-18 MED ORDER — ISOSORBIDE MONONITRATE ER 60 MG PO TB24
60.0000 mg | ORAL_TABLET | Freq: Every day | ORAL | Status: DC
  Administered 2022-11-20: 60 mg via ORAL
  Filled 2022-11-18: qty 1

## 2022-11-18 NOTE — ED Triage Notes (Signed)
Pt BIB EMS on CPAP. Pt from [facility]. Per facility O2 was in the 30s. With NRB, O2 was 40. With 25L, pt oxygen 75%. Per EMS pt placed on CPAP. Sat now 89%. Pt has a history of COPD.  EMS given 1 nitroglycerin tablet  Vitals 228/148 213 CBG 95 HR

## 2022-11-18 NOTE — Procedures (Signed)
   I was present at this dialysis session, have reviewed the session itself and made  appropriate changes Kelly Splinter MD [place] pager [phone]   11/18/2022, 11:02 PM

## 2022-11-18 NOTE — H&P (Signed)
History and Physical    Nancy [name] GNF:[id number] DOB: 27-Feb-1961 DOA: 11/18/2022  PCP: [name], MD (Confirm with patient/family/NH records and if not entered, this has to be entered at [facility] point of entry) Patient coming from: Home  I have personally briefly reviewed patient's old medical records in [place]  Chief Complaint: SOB  HPI: Nancy [name] is a 61 y.o. female with medical history significant of ESRD on HD MWF, HTN, chronic HFpEF, HLD, CAD, IDDM, PVD status post bilateral BKA, presented with increasing shortness of breath.  Patient underwent normal scheduled hemodialysis, last session was on Friday.  This morning, patient started to have severe shortness of breath, no cough, no chest pain, cannot lie flat because shortness of breath, denies any fever or chills.  She reported that she has been compliant with her BP meds and fluid restriction at home.  ED Course: EMS arrived and found patient blood pressure significantly elevated and O2 saturation in the 30s and placed on NRB.  In the ED, workup showed x-ray showed pulmonary edema and patient was started on BiPAP.  Nephrology consulted for emergency HD.  Blood pressure significant elevated with SBP 180s.  Review of Systems: As per HPI otherwise 10 point review of systems negative.    Past Medical History:  Diagnosis Date   Anemia 2006   Anginal pain ([place])    Carotid artery disease ([place])    right ICA occlusion, 84-69% LICA 05/2951 [place]   CHF (congestive heart failure) ([place])    Chronic kidney disease    Coronary artery disease    Depression 2014   previously on amitryptiline    Diabetes mellitus with neurological manifestation ([place]) 2006   Diabetic peripheral neuropathy ([place]) 04/26/2020   Dysphagia    Dyspnea    Fracture of left ankle 1997   Gastroparesis 07/2016   Heart murmur    HOH (hard of hearing) 2004   Hyperlipidemia 2006   Hypertension 2006   IBS (irritable bowel syndrome) 2002    Leukopenia 2015   Macular degeneration 11/2019   Peripheral vascular disease ([place])    Shingles 2009   Stroke [facility]) 2020   Thyroid nodule 2004    Past Surgical History:  Procedure Laterality Date   A/V FISTULAGRAM Right 09/25/2022   Procedure: A/V Fistulagram;  Surgeon: Serafina Mitchell, MD;  Location: [place] [facility];  Service: Cardiovascular;  Laterality: Right;   ABDOMINAL HYSTERECTOMY  2005   AMPUTATION Bilateral 04/04/2022   Procedure: BILATERAL BELOW KNEE AMPUTATION;  Surgeon: Newt Minion, MD;  Location: [place];  Service: Orthopedics;  Laterality: Bilateral;   AV FISTULA PLACEMENT Right 12/22/2021   Procedure: RIGHT ARM ARTERIOVENOUS (AV) BRACIOCPHELAIC FISTULA CREATION;  Surgeon: Waynetta Sandy, MD;  Location: [place];  Service: Vascular;  Laterality: Right;   AV FISTULA PLACEMENT Right 02/19/2022   Procedure: RIGHT ARTERIOVENOUS FISTULA;  Surgeon: Angelia Mould, MD;  Location: [place];  Service: Vascular;  Laterality: Right;   [place] Right 08/07/2022   Procedure: SECOND STAGE RIGHT BASILIC VEIN TRANSPOSITION;  Surgeon: Angelia Mould, MD;  Location: [place];  Service: Vascular;  Laterality: Right;   CATARACT EXTRACTION Left 11/2019   CESAREAN SECTION  1983    CHOLECYSTECTOMY N/A 03/05/2020   Procedure: LAPAROSCOPIC CHOLECYSTECTOMY WITH INTRAOPERATIVE CHOLANGIOGRAM;  Surgeon: Donnie Mesa, MD;  Location: [facility];  Service: General;  Laterality: N/A;   ESOPHAGOGASTRODUODENOSCOPY (EGD) WITH PROPOFOL Left 08/26/2014   Procedure: ESOPHAGOGASTRODUODENOSCOPY (EGD) WITH PROPOFOL;  Surgeon: Arta Silence, MD;  Location: [facility];  Service: Endoscopy;  Laterality: Left;   ESOPHAGOGASTRODUODENOSCOPY (EGD) WITH PROPOFOL N/A 03/03/2020   Procedure: ESOPHAGOGASTRODUODENOSCOPY (EGD) WITH PROPOFOL;  Surgeon: Jerene Bears, MD;  Location: [facility];  Service: Gastroenterology;  Laterality: N/A;   IR FLUORO GUIDE CV LINE RIGHT  02/15/2022   IR US GUIDE  VASC ACCESS RIGHT  02/15/2022   PERIPHERAL VASCULAR BALLOON ANGIOPLASTY Right 09/25/2022   Procedure: PERIPHERAL VASCULAR BALLOON ANGIOPLASTY;  Surgeon: Serafina Mitchell, MD;  Location: [place] [facility];  Service: Cardiovascular;  Laterality: Right;  AVF   RIGHT HEART CATH N/A 07/14/2021   Procedure: RIGHT HEART CATH;  Surgeon: Larey Dresser, MD;  Location: [place] [facility];  Service: Cardiovascular;  Laterality: N/A;   RIGHT/LEFT HEART CATH AND CORONARY ANGIOGRAPHY N/A 04/27/2021   Procedure: RIGHT/LEFT HEART CATH AND CORONARY ANGIOGRAPHY;  Surgeon: [name], MD;  Location: [place] [facility];  Service: Cardiovascular;  Laterality: N/A;     reports that she has quit smoking. Her smoking use included cigarettes. She has a 0.13 pack-year smoking history. She has never been exposed to tobacco smoke. She has never used smokeless tobacco. She reports that she does not drink alcohol and does not use drugs.  Allergies  Allergen Reactions   Hydralazine Other (See Comments)   Hydralazine Hcl Other (See Comments)    Hair loss   Hydrocodone Itching and Other (See Comments)    Upset stomach   Metformin And Related Nausea And Vomiting and Other (See Comments)    Stomach pains, also   Other Nausea Only and Other (See Comments)    Lettuce- Does not digest this!!   Plaquenil [Hydroxychloroquine Sulfate] Hives   Shellfish Allergy Nausea And Vomiting   Shellfish-Derived Products Nausea Only and Other (See Comments)    Caused an upset stomach   Shrimp (Diagnostic) Nausea Only and Other (See Comments)    Upset stomach    Sulfa Antibiotics Hives    Family History  Problem Relation Age of Onset   Hypertension Mother    Heart disease Mother    Diabetes Mother    Thyroid disease Mother    Congestive Heart Failure Mother    Breast cancer Maternal Grandmother    Colon cancer Maternal Grandfather    Heart attack Sister    Heart disease Brother    Hyperlipidemia Brother    Hypertension  Brother    Diabetes Father    Breast cancer Maternal Aunt      Prior to Admission medications   Medication Sig Start Date End Date Taking? Authorizing Provider  amLODipine (NORVASC) 10 MG tablet Take 1 tablet (10 mg total) by mouth daily. 06/04/22   Shelly Coss, MD  aspirin 81 MG EC tablet Take 1 tablet (81 mg total) by mouth daily. 09/26/21   Milford, Maricela Bo, FNP  atorvastatin (LIPITOR) 40 MG tablet Take 1 tablet (40 mg total) by mouth daily. 11/07/21   Larey Dresser, MD  Blood Glucose Monitoring Suppl [facility] VERIO) w/Device KIT Check blood sugar three times daily. E11.40 05/06/20   [name], MD  Continuous Blood Gluc Receiver (DEXCOM G6 RECEIVER) DEVI 1 Device by Does not apply route daily. 11/15/21   [name], MD  Continuous Blood Gluc Sensor (DEXCOM G6 SENSOR) MISC 1 packet by Does not apply route daily. 11/15/21   [name], MD  Continuous Blood Gluc Transmit (DEXCOM G6 TRANSMITTER) MISC 1 packet by Does not apply route  daily. 11/15/21   [name], MD  DM-Doxylamine-Acetaminophen (NYQUIL HBP COLD & FLU) 15-6.25-325 MG/15ML LIQD Take 30 mLs by mouth every 8 (eight) hours as needed (Cold).    [provider]  ezetimibe (ZETIA) 10 MG tablet Take 1 tablet (10 mg total) by mouth daily. 04/11/22   Danford, Suann Larry, MD  ferrous sulfate 325 (65 FE) MG tablet Take 1 tablet (325 mg total) by mouth daily with breakfast. 11/07/21   [name], MD  glucose blood test strip Use as instructedCheck blood sugar three times daily. E11.40 07/11/20   [name], MD  HYDROmorphone (DILAUDID) 2 MG tablet Take 1-1.5 tablets (2-3 mg total) by mouth every 4 (four) hours as needed for severe pain (pain score 7-10). 11/05/22   Darliss Cheney, MD  insulin glargine (LANTUS) 100 UNIT/ML Solostar Pen Inject 10 Units into the skin at bedtime. 04/10/22   Danford, Suann Larry, MD  Insulin Pen Needle 32G X 4 MM MISC Use to inject insulin as  directed. 11/07/21   [name], MD  isosorbide mononitrate (IMDUR) 30 MG 24 hr tablet Take 1 tablet (30 mg total) by mouth daily. 06/04/22   Shelly Coss, MD  labetalol (NORMODYNE) 300 MG tablet Take 1 tablet (300 mg total) by mouth 2 (two) times daily. 06/04/22   Shelly Coss, MD  multivitamin (RENA-VIT) TABS tablet Take 1 tablet by mouth at bedtime. Patient taking differently: Take 1 tablet by mouth daily. 04/10/22   Danford, Suann Larry, MD  NOVOLOG FLEXPEN 100 UNIT/ML FlexPen Inject 3 Units into the skin in the morning and at bedtime. 06/04/22   [provider]  ondansetron (ZOFRAN) 8 MG tablet Take 1 tablet (8 mg total) by mouth every 8 (eight) hours as needed for nausea 05/23/22     pantoprazole (PROTONIX) 40 MG tablet Take 1 tablet (40 mg total) by mouth daily. 04/11/22   Danford, Suann Larry, MD  polyethylene glycol (MIRALAX / GLYCOLAX) 17 g packet Take 17 g by mouth daily. 06/04/22   Shelly Coss, MD  promethazine (PHENERGAN) 25 MG tablet Take 25-50 mg by mouth every 8 (eight) hours as needed for vomiting or nausea. 04/23/22   [provider]  sevelamer (RENAGEL) 800 MG tablet Take 1,600 mg by mouth 3 (three) times daily. 07/06/22   [provider]  zinc sulfate 220 (50 Zn) MG capsule Take 1 capsule (220 mg total) by mouth daily. 04/11/22   Danford, Suann Larry, MD  Insulin NPH Isophane & Regular (RELION 70/30 [place]) Inject 35 Units into the skin 2 (two) times daily.  08/17/14  [provider]    Physical Exam: Vitals:   11/18/22 1121 11/18/22 1129 11/18/22 1245 11/18/22 1330  BP:  (!) 165/81 (!) 167/101 (!) 188/85  Pulse: 96  89 87  Resp: (!) 23  18 (!) 21  Temp:      TempSrc:      SpO2: 96%  100% 99%  Weight:      Height:        Constitutional: NAD, calm, comfortable Vitals:   11/18/22 1121 11/18/22 1129 11/18/22 1245 11/18/22 1330  BP:  (!) 165/81 (!) 167/101 (!) 188/85  Pulse: 96  89 87  Resp: (!) 23  18 (!) 21  Temp:       TempSrc:      SpO2: 96%  100% 99%  Weight:      Height:       Eyes: PERRL, lids and conjunctivae normal ENMT: Mucous  membranes are moist. Posterior pharynx clear of any exudate or lesions.Normal dentition.  Neck: normal, supple, no masses, no thyromegaly Respiratory: clear to auscultation bilaterally, no wheezing, fine crackles to the bilateral mid levels, with increasing breathing effort, positive signs of using accessory muscle  Cardiovascular: Regular rate and rhythm, no murmurs / rubs / gallops. No extremity edema. 2+ pedal pulses. No carotid bruits.  Abdomen: no tenderness, no masses palpated. No hepatosplenomegaly. Bowel sounds positive.  Musculoskeletal: no clubbing / cyanosis. No joint deformity upper and lower extremities. Good ROM, no contractures. Normal muscle tone.  Skin: no rashes, lesions, ulcers. No induration Neurologic: CN 2-12 grossly intact. Sensation intact, DTR normal. Strength 5/5 in all 4.  Psychiatric: Normal judgment and insight. Alert and oriented x 3. Normal mood.     Labs on Admission: I have personally reviewed following labs and imaging studies  CBC: Recent Labs  Lab 11/18/22 1116 11/18/22 1250  WBC 10.5  --   NEUTROABS 8.0*  --   HGB 10.7* 11.9*  HCT 36.9 35.0*  MCV 92.9  --   PLT 225  --    Basic Metabolic Panel: Recent Labs  Lab 11/18/22 1116 11/18/22 1250  NA 138 139  K 4.6 4.3  CL 99  --   CO2 28  --   GLUCOSE 195*  --   BUN 22  --   CREATININE 4.69*  --   CALCIUM 8.8*  --    GFR: Estimated Creatinine Clearance: 14 mL/min (A) (by C-G formula based on SCr of 4.69 mg/dL (H)). Liver Function Tests: Recent Labs  Lab 11/18/22 1116  AST 23  ALT 9  ALKPHOS 89  BILITOT 1.1  PROT 7.0  ALBUMIN 3.5   No results for input(s): "LIPASE", "AMYLASE" in the last 168 hours. No results for input(s): "AMMONIA" in the last 168 hours. Coagulation Profile: No results for input(s): "INR", "PROTIME" in the last 168 hours. Cardiac  Enzymes: No results for input(s): "CKTOTAL", "CKMB", "CKMBINDEX", "TROPONINI" in the last 168 hours. BNP (last 3 results) No results for input(s): "PROBNP" in the last 8760 hours. HbA1C: No results for input(s): "HGBA1C" in the last 72 hours. CBG: No results for input(s): "GLUCAP" in the last 168 hours. Lipid Profile: No results for input(s): "CHOL", "HDL", "LDLCALC", "TRIG", "CHOLHDL", "LDLDIRECT" in the last 72 hours. Thyroid Function Tests: No results for input(s): "TSH", "T4TOTAL", "FREET4", "T3FREE", "THYROIDAB" in the last 72 hours. Anemia Panel: No results for input(s): "VITAMINB12", "FOLATE", "FERRITIN", "TIBC", "IRON", "RETICCTPCT" in the last 72 hours. Urine analysis:    Component Value Date/Time   COLORURINE YELLOW 10/06/2021 0138   APPEARANCEUR CLEAR 10/06/2021 0138   LABSPEC 1.009 10/06/2021 0138   PHURINE 5.0 10/06/2021 0138   GLUCOSEU 50 (A) 10/06/2021 0138   HGBUR MODERATE (A) 10/06/2021 0138   BILIRUBINUR NEGATIVE 10/06/2021 0138   BILIRUBINUR negative 12/26/2017 1458   KETONESUR NEGATIVE 10/06/2021 0138   PROTEINUR >=300 (A) 10/06/2021 0138   UROBILINOGEN 0.2 12/26/2017 1458   UROBILINOGEN 0.2 09/07/2014 0857   NITRITE NEGATIVE 10/06/2021 0138   LEUKOCYTESUR NEGATIVE 10/06/2021 0138    Radiological Exams on Admission: DG Chest Portable 1 View  Result Date: 11/18/2022 CLINICAL DATA:  Shortness of breath.  Patient is on CPAP. EXAM: PORTABLE CHEST 1 VIEW COMPARISON:  November 04, 2022 FINDINGS: The heart size and mediastinal contours are stable. Right central venous line is unchanged. Diffuse bilateral airspace disease identified in both lungs slightly worse in the left mid and lung base compared to prior exam.  Probable small bilateral pleural effusions. The visualized skeletal structures are stable. IMPRESSION: Diffuse bilateral airspace disease identified in both lungs slightly worse in the left mid and lung base compared to prior exam, favor pulmonary edema.  Superimposed pneumonia especially at the lung base is not excluded. Electronically Signed   By: Abelardo Diesel M.D.   On: 11/18/2022 11:45    EKG: Independently reviewed.  Sinus rhythm, no acute ST changes.  Assessment/Plan Principal Problem:   CHF (congestive heart failure) (HCC) Active Problems:   Hypertensive urgency   CKD (chronic kidney disease), stage IV (HCC)   Acute cardiogenic pulmonary edema (HCC)   Acute exacerbation of congestive heart failure ([place])  (please populate well all problems here in Problem List. (For example, if patient is on BP meds at home and you resume or decide to hold them, it is a problem that needs to be her. Same for CAD, COPD, HLD and so on)  Acute on chronic HFpEF decompensation -Significant fluid overload and hypertension emergency -Contact on-call nephrology for emergency dialysis -1 dose of Lasix IV x 1 given.  HTN emergency -She is already on maximum dose of amlodipine, labetalol, and she is on 30 mg of Imdur.  Unfortunately she is allergic to hydralazine (hair loss?).  For now we will increase Imdur to 60 mg, can be further titrated.  Discussed with nephrology.  IDDM with upper glycemia -Sliding scale  ESRD on HD -As above  Hx of CVA -Continue aspirin Zetia atorvastatin  PVD and B/L BKA -No acute concerns  DVT prophylaxis: Heparin subcu Code Status: Full code Family Communication: None at bedside Disposition Plan: Expect less than 2 midnight hospital stay Consults called: Nephrology Admission status: PCU observation   Lequita Halt MD [facility] Pager [phone]  11/18/2022, 2:34 PM

## 2022-11-18 NOTE — Progress Notes (Signed)
Patient was transported from ER to 3E11 via the BiPAP with no complications.

## 2022-11-18 NOTE — ED Provider Notes (Signed)
[facility] EMERGENCY DEPARTMENT Provider Note   CSN: [id number] Arrival date & time: 11/18/22  1108     History  Chief Complaint  Patient presents with   Respiratory Distress    Nancy [name] is a 61 y.o. female.  HPI Patient brought in short of breath.  Patient cannot provide much history due to dyspnea.  Reportedly short of breath starting today.  Cough without really much sputum production.  Some chest tightness.  Per EMS reportedly had pressure of 230/150 and at the facility had sats of 30% on room air.  On nonrebreather sats of 40% on high flow 25 L oxygen was 75%.  However EMS reportedly on CPAP with sats of 89%.  Upon my arrival to room patient been transferred over to BiPAP on an FiO2 of 41% with sats of 96%. History of dialysis.  Reportedly was dialyzed on Friday with today being Sunday. patient states she feels as if she is carrying too much fluid.   Past Medical History:  Diagnosis Date   Anemia 2006   Anginal pain ([place])    Carotid artery disease ([place])    right ICA occlusion, 62-56% LICA 02/8936 [place]   CHF (congestive heart failure) ([place])    Chronic kidney disease    Coronary artery disease    Depression 2014   previously on amitryptiline    Diabetes mellitus with neurological manifestation ([place]) 2006   Diabetic peripheral neuropathy ([place]) 04/26/2020   Dysphagia    Dyspnea    Fracture of left ankle 1997   Gastroparesis 07/2016   Heart murmur    HOH (hard of hearing) 2004   Hyperlipidemia 2006   Hypertension 2006   IBS (irritable bowel syndrome) 2002   Leukopenia 2015   Macular degeneration 11/2019   Peripheral vascular disease ([place])    Shingles 2009   Stroke [facility]) 2020   Thyroid nodule 2004    Home Medications Prior to Admission medications   Medication Sig Start Date End Date Taking? Authorizing Provider  amLODipine (NORVASC) 10 MG tablet Take 1 tablet (10 mg total) by mouth daily. 06/04/22   Shelly Coss, MD  aspirin 81 MG EC  tablet Take 1 tablet (81 mg total) by mouth daily. 09/26/21   Milford, Maricela Bo, FNP  atorvastatin (LIPITOR) 40 MG tablet Take 1 tablet (40 mg total) by mouth daily. 11/07/21   Larey Dresser, MD  Blood Glucose Monitoring Suppl [facility] VERIO) w/Device KIT Check blood sugar three times daily. E11.40 05/06/20   [name], MD  Continuous Blood Gluc Receiver (DEXCOM G6 RECEIVER) DEVI 1 Device by Does not apply route daily. 11/15/21   [name], MD  Continuous Blood Gluc Sensor (DEXCOM G6 SENSOR) MISC 1 packet by Does not apply route daily. 11/15/21   [name], MD  Continuous Blood Gluc Transmit (DEXCOM G6 TRANSMITTER) MISC 1 packet by Does not apply route daily. 11/15/21   [name], MD  DM-Doxylamine-Acetaminophen (NYQUIL HBP COLD & FLU) 15-6.25-325 MG/15ML LIQD Take 30 mLs by mouth every 8 (eight) hours as needed (Cold).    [provider]  ezetimibe (ZETIA) 10 MG tablet Take 1 tablet (10 mg total) by mouth daily. 04/11/22   Danford, Suann Larry, MD  ferrous sulfate 325 (65 FE) MG tablet Take 1 tablet (325 mg total) by mouth daily with breakfast. 11/07/21   [name], MD  glucose blood test strip Use as instructedCheck blood sugar three times daily. E11.40 07/11/20  [name], MD  HYDROmorphone (DILAUDID) 2 MG tablet Take 1-1.5 tablets (2-3 mg total) by mouth every 4 (four) hours as needed for severe pain (pain score 7-10). 11/05/22   Darliss Cheney, MD  insulin glargine (LANTUS) 100 UNIT/ML Solostar Pen Inject 10 Units into the skin at bedtime. 04/10/22   Danford, Suann Larry, MD  Insulin Pen Needle 32G X 4 MM MISC Use to inject insulin as directed. 11/07/21   [name], MD  isosorbide mononitrate (IMDUR) 30 MG 24 hr tablet Take 1 tablet (30 mg total) by mouth daily. 06/04/22   Shelly Coss, MD  labetalol (NORMODYNE) 300 MG tablet Take 1 tablet (300 mg total) by mouth 2 (two) times daily. 06/04/22   Shelly Coss, MD   multivitamin (RENA-VIT) TABS tablet Take 1 tablet by mouth at bedtime. Patient taking differently: Take 1 tablet by mouth daily. 04/10/22   Danford, Suann Larry, MD  NOVOLOG FLEXPEN 100 UNIT/ML FlexPen Inject 3 Units into the skin in the morning and at bedtime. 06/04/22   [provider]  ondansetron (ZOFRAN) 8 MG tablet Take 1 tablet (8 mg total) by mouth every 8 (eight) hours as needed for nausea 05/23/22     pantoprazole (PROTONIX) 40 MG tablet Take 1 tablet (40 mg total) by mouth daily. 04/11/22   Danford, Suann Larry, MD  polyethylene glycol (MIRALAX / GLYCOLAX) 17 g packet Take 17 g by mouth daily. 06/04/22   Shelly Coss, MD  promethazine (PHENERGAN) 25 MG tablet Take 25-50 mg by mouth every 8 (eight) hours as needed for vomiting or nausea. 04/23/22   [provider]  sevelamer (RENAGEL) 800 MG tablet Take 1,600 mg by mouth 3 (three) times daily. 07/06/22   [provider]  zinc sulfate 220 (50 Zn) MG capsule Take 1 capsule (220 mg total) by mouth daily. 04/11/22   Danford, Suann Larry, MD  Insulin NPH Isophane & Regular (RELION 70/30 [place]) Inject 35 Units into the skin 2 (two) times daily.  08/17/14  [provider]      Allergies    Hydralazine, Hydralazine hcl, Hydrocodone, Metformin and related, Other, Plaquenil [hydroxychloroquine sulfate], Shellfish allergy, Shellfish-derived products, Shrimp (diagnostic), and Sulfa antibiotics    Review of Systems   Review of Systems  Physical Exam Updated Vital Signs BP (!) 167/101   Pulse 89   Temp (!) 97.4 F (36.3 C) (Temporal)   Resp 18   Ht 5' 3" (1.6 m)   Wt 99.8 kg   SpO2 100%   BMI 38.97 kg/m  Physical Exam Vitals and nursing note reviewed.  HENT:     Head: Normocephalic.  Eyes:     Pupils: Pupils are equal, round, and reactive to light.  Cardiovascular:     Rate and Rhythm: Regular rhythm.  Pulmonary:     Comments: Somewhat harsh breath sounds with some rhonchi.  On BiPAP. Abdominal:      Tenderness: There is no abdominal tenderness.  Musculoskeletal:     Comments: Bilateral below the knee amputations.  Neurological:     Mental Status: She is alert and oriented to person, place, and time.     ED Results / Procedures / Treatments   Labs (all labs ordered are listed, but only abnormal results are displayed) Labs Reviewed  COMPREHENSIVE METABOLIC PANEL - Abnormal; Notable for the following components:      Result Value   Glucose, Bld 195 (*)    Creatinine, Ser 4.69 (*)    Calcium 8.8 (*)  GFR, Estimated 10 (*)    All other components within normal limits  CBC WITH DIFFERENTIAL/PLATELET - Abnormal; Notable for the following components:   Hemoglobin 10.7 (*)    MCHC 29.0 (*)    RDW 18.6 (*)    Neutro Abs 8.0 (*)    Eosinophils Absolute 0.6 (*)    All other components within normal limits  I-STAT ARTERIAL BLOOD GAS, ED - Abnormal; Notable for the following components:   pH, Arterial 7.349 (*)    pCO2 arterial 58.7 (*)    pO2, Arterial 73 (*)    Bicarbonate 32.3 (*)    TCO2 34 (*)    Acid-Base Excess 5.0 (*)    HCT 35.0 (*)    Hemoglobin 11.9 (*)    All other components within normal limits  TROPONIN I (HIGH SENSITIVITY) - Abnormal; Notable for the following components:   Troponin I (High Sensitivity) 66 (*)    All other components within normal limits  RESP PANEL BY RT-PCR (FLU A&B, COVID) ARPGX2  TROPONIN I (HIGH SENSITIVITY)    EKG EKG Interpretation  Date/Time:  Sunday November 18 2022 11:15:41 EST Ventricular Rate:  95 PR Interval:  166 QRS Duration: 83 QT Interval:  385 QTC Calculation: 484 R Axis:   1 Text Interpretation: Sinus rhythm No significant change since last tracing Confirmed by Davonna Belling [phone]) on 11/18/2022 11:29:28 AM  Radiology DG Chest Portable 1 View  Result Date: 11/18/2022 CLINICAL DATA:  Shortness of breath.  Patient is on CPAP. EXAM: PORTABLE CHEST 1 VIEW COMPARISON:  November 04, 2022 FINDINGS: The heart size and  mediastinal contours are stable. Right central venous line is unchanged. Diffuse bilateral airspace disease identified in both lungs slightly worse in the left mid and lung base compared to prior exam. Probable small bilateral pleural effusions. The visualized skeletal structures are stable. IMPRESSION: Diffuse bilateral airspace disease identified in both lungs slightly worse in the left mid and lung base compared to prior exam, favor pulmonary edema. Superimposed pneumonia especially at the lung base is not excluded. Electronically Signed   By: Abelardo Diesel M.D.   On: 11/18/2022 11:45    Procedures Procedures    Medications Ordered in ED Medications - No data to display  ED Course/ Medical Decision Making/ A&P                           Medical Decision Making Amount and/or Complexity of Data Reviewed Labs: ordered. Radiology: ordered.   Patient shortness of breath and cough.  History of volume overload requiring treatment.  Reported had severe hypoxia initially but better upon arrival.  We will get x-ray and blood work.  Reviewed recent discharge note.  Differential diagnosis includes pneumonia, volume overload, pneumothorax. Chest x-ray shows likely volume overload.  Reassuring potassium on CMP.  Negative flu and COVID testing.  ABG does show some hypoxia and hypercapnia.  Appears similar to last admission.  Will discuss with hospitalist for admission and will discuss with nephrology for urgent dialysis.  CRITICAL CARE Performed by: Davonna Belling Total critical care time: 30 minutes Critical care time was exclusive of separately billable procedures and treating other patients. Critical care was necessary to treat or prevent imminent or life-threatening deterioration. Critical care was time spent personally by me on the following activities: development of treatment plan with patient and/or surrogate as well as nursing, discussions with consultants, evaluation of patient's response to  treatment, examination of patient, obtaining history from  patient or surrogate, ordering and performing treatments and interventions, ordering and review of laboratory studies, ordering and review of radiographic studies, pulse oximetry and re-evaluation of patient's condition.         Final Clinical Impression(s) / ED Diagnoses Final diagnoses:  Acute respiratory failure, unspecified whether with hypoxia or hypercapnia ([place])  Acute pulmonary edema (HCC)  End stage renal disease on dialysis [facility])    Rx / DC Orders ED Discharge Orders     None         Davonna Belling, MD 11/18/22 1303

## 2022-11-18 NOTE — Consult Note (Signed)
Renal Service Consult Note [facility]  Nancy [name] 11/18/2022 Sol Blazing, MD Requesting Physician: Dr. Roosevelt Locks  Reason for Consult: ESRD pt w/ resp distress HPI: The patient is a 61 y.o. year-old w/ PMH as below who presented to ED from SNF for SOB. Per facility SpO2 in the 30s on RA, w/ NRG SpO2 was 40% and w/ 25L only 75% SpO2. EMS placed pt on CPAP and was switched to bipap in ED. Sat's now > 90%. In ED CXR showed significant CHF/ pulm edema. Last HD was Friday, had not missed HD. We are asked to see for dialysis.   Pt seen in ED. Pt on bipap, denies any CP or cough, fevers, chills, abd pain. Pt has been in the SNF for 8 mos, still planning to get home when she is able to walk w/ bilat prosthetics (had bilat BKA). Lives w/ her husband when not in SNF.     ROS - denies CP, no joint pain, no HA, no blurry vision, no rash, no diarrhea, no nausea/ vomiting, no dysuria, no difficulty voiding   Past Medical History  Past Medical History:  Diagnosis Date   Anemia 2006   Anginal pain (HCC)    Carotid artery disease ([place])    right ICA occlusion, 35-57% LICA 02/2201 [place]   CHF (congestive heart failure) ([place])    Chronic kidney disease    Coronary artery disease    Depression 2014   previously on amitryptiline    Diabetes mellitus with neurological manifestation ([place]) 2006   Diabetic peripheral neuropathy ([place]) 04/26/2020   Dysphagia    Dyspnea    Fracture of left ankle 1997   Gastroparesis 07/2016   Heart murmur    HOH (hard of hearing) 2004   Hyperlipidemia 2006   Hypertension 2006   IBS (irritable bowel syndrome) 2002   Leukopenia 2015   Macular degeneration 11/2019   Peripheral vascular disease ([place])    Shingles 2009   Stroke [facility]) 2020   Thyroid nodule 2004   Past Surgical History  Past Surgical History:  Procedure Laterality Date   A/V FISTULAGRAM Right 09/25/2022   Procedure: A/V Fistulagram;  Surgeon: Serafina Mitchell, MD;  Location: [place] [facility];  Service: Cardiovascular;  Laterality: Right;   ABDOMINAL HYSTERECTOMY  2005   AMPUTATION Bilateral 04/04/2022   Procedure: BILATERAL BELOW KNEE AMPUTATION;  Surgeon: Newt Minion, MD;  Location: [place];  Service: Orthopedics;  Laterality: Bilateral;   AV FISTULA PLACEMENT Right 12/22/2021   Procedure: RIGHT ARM ARTERIOVENOUS (AV) BRACIOCPHELAIC FISTULA CREATION;  Surgeon: Waynetta Sandy, MD;  Location: [place];  Service: Vascular;  Laterality: Right;   AV FISTULA PLACEMENT Right 02/19/2022   Procedure: RIGHT ARTERIOVENOUS FISTULA;  Surgeon: Angelia Mould, MD;  Location: [place];  Service: Vascular;  Laterality: Right;   [place] Right 08/07/2022   Procedure: SECOND STAGE RIGHT BASILIC VEIN TRANSPOSITION;  Surgeon: Angelia Mould, MD;  Location: [place];  Service: Vascular;  Laterality: Right;   CATARACT EXTRACTION Left 11/2019   CESAREAN SECTION  1983    CHOLECYSTECTOMY N/A 03/05/2020   Procedure: LAPAROSCOPIC CHOLECYSTECTOMY WITH INTRAOPERATIVE CHOLANGIOGRAM;  Surgeon: Donnie Mesa, MD;  Location: [facility];  Service: General;  Laterality: N/A;   ESOPHAGOGASTRODUODENOSCOPY (EGD) WITH PROPOFOL Left 08/26/2014   Procedure: ESOPHAGOGASTRODUODENOSCOPY (EGD) WITH PROPOFOL;  Surgeon: Arta Silence, MD;  Location: [facility];  Service: Endoscopy;  Laterality: Left;   ESOPHAGOGASTRODUODENOSCOPY (EGD) WITH PROPOFOL N/A 03/03/2020   Procedure:  ESOPHAGOGASTRODUODENOSCOPY (EGD) WITH PROPOFOL;  Surgeon: Jerene Bears, MD;  Location: [facility];  Service: Gastroenterology;  Laterality: N/A;   IR FLUORO GUIDE CV LINE RIGHT  02/15/2022   IR US GUIDE VASC ACCESS RIGHT  02/15/2022   PERIPHERAL VASCULAR BALLOON ANGIOPLASTY Right 09/25/2022   Procedure: PERIPHERAL VASCULAR BALLOON ANGIOPLASTY;  Surgeon: Serafina Mitchell, MD;  Location: [place] CV LAB;  Service: Cardiovascular;  Laterality: Right;  AVF   RIGHT HEART CATH N/A 07/14/2021   Procedure: RIGHT HEART  CATH;  Surgeon: Larey Dresser, MD;  Location: [place] CV LAB;  Service: Cardiovascular;  Laterality: N/A;   RIGHT/LEFT HEART CATH AND CORONARY ANGIOGRAPHY N/A 04/27/2021   Procedure: RIGHT/LEFT HEART CATH AND CORONARY ANGIOGRAPHY;  Surgeon: [name], MD;  Location: [place] [facility];  Service: Cardiovascular;  Laterality: N/A;   Family History  Family History  Problem Relation Age of Onset   Hypertension Mother    Heart disease Mother    Diabetes Mother    Thyroid disease Mother    Congestive Heart Failure Mother    Breast cancer Maternal Grandmother    Colon cancer Maternal Grandfather    Heart attack Sister    Heart disease Brother    Hyperlipidemia Brother    Hypertension Brother    Diabetes Father    Breast cancer Maternal Aunt    Social History  reports that she has quit smoking. Her smoking use included cigarettes. She has a 0.13 pack-year smoking history. She has never been exposed to tobacco smoke. She has never used smokeless tobacco. She reports that she does not drink alcohol and does not use drugs. Allergies  Allergies  Allergen Reactions   Hydralazine Other (See Comments)   Hydralazine Hcl Other (See Comments)    Hair loss   Hydrocodone Itching and Other (See Comments)    Upset stomach   Metformin And Related Nausea And Vomiting and Other (See Comments)    Stomach pains, also   Other Nausea Only and Other (See Comments)    Lettuce- Does not digest this!!   Plaquenil [Hydroxychloroquine Sulfate] Hives   Shellfish Allergy Nausea And Vomiting   Shellfish-Derived Products Nausea Only and Other (See Comments)    Caused an upset stomach   Shrimp (Diagnostic) Nausea Only and Other (See Comments)    Upset stomach    Sulfa Antibiotics Hives   Home medications Prior to Admission medications   Medication Sig Start Date End Date Taking? Authorizing Provider  amLODipine (NORVASC) 10 MG tablet Take 1 tablet (10 mg total) by mouth daily. 06/04/22   Shelly Coss, MD  aspirin 81 MG EC tablet Take 1 tablet (81 mg total) by mouth daily. 09/26/21   Milford, Maricela Bo, FNP  atorvastatin (LIPITOR) 40 MG tablet Take 1 tablet (40 mg total) by mouth daily. 11/07/21   Larey Dresser, MD  Blood Glucose Monitoring Suppl [facility] VERIO) w/Device KIT Check blood sugar three times daily. E11.40 05/06/20   [name], MD  Continuous Blood Gluc Receiver (DEXCOM G6 RECEIVER) DEVI 1 Device by Does not apply route daily. 11/15/21   [name], MD  Continuous Blood Gluc Sensor (DEXCOM G6 SENSOR) MISC 1 packet by Does not apply route daily. 11/15/21   [name], MD  Continuous Blood Gluc Transmit (DEXCOM G6 TRANSMITTER) MISC 1 packet by Does not apply route daily. 11/15/21   [name], MD  DM-Doxylamine-Acetaminophen (NYQUIL HBP COLD & FLU) 15-6.25-325 MG/15ML LIQD Take 30 mLs  by mouth every 8 (eight) hours as needed (Cold).    [provider]  ezetimibe (ZETIA) 10 MG tablet Take 1 tablet (10 mg total) by mouth daily. 04/11/22   Danford, Suann Larry, MD  ferrous sulfate 325 (65 FE) MG tablet Take 1 tablet (325 mg total) by mouth daily with breakfast. 11/07/21   [name], MD  glucose blood test strip Use as instructedCheck blood sugar three times daily. E11.40 07/11/20   [name], MD  HYDROmorphone (DILAUDID) 2 MG tablet Take 1-1.5 tablets (2-3 mg total) by mouth every 4 (four) hours as needed for severe pain (pain score 7-10). 11/05/22   Darliss Cheney, MD  insulin glargine (LANTUS) 100 UNIT/ML Solostar Pen Inject 10 Units into the skin at bedtime. 04/10/22   Danford, Suann Larry, MD  Insulin Pen Needle 32G X 4 MM MISC Use to inject insulin as directed. 11/07/21   [name], MD  isosorbide mononitrate (IMDUR) 30 MG 24 hr tablet Take 1 tablet (30 mg total) by mouth daily. 06/04/22   Shelly Coss, MD  labetalol (NORMODYNE) 300 MG tablet Take 1 tablet (300 mg total) by mouth 2 (two) times daily. 06/04/22    Shelly Coss, MD  multivitamin (RENA-VIT) TABS tablet Take 1 tablet by mouth at bedtime. Patient taking differently: Take 1 tablet by mouth daily. 04/10/22   Danford, Suann Larry, MD  NOVOLOG FLEXPEN 100 UNIT/ML FlexPen Inject 3 Units into the skin in the morning and at bedtime. 06/04/22   [provider]  ondansetron (ZOFRAN) 8 MG tablet Take 1 tablet (8 mg total) by mouth every 8 (eight) hours as needed for nausea 05/23/22     pantoprazole (PROTONIX) 40 MG tablet Take 1 tablet (40 mg total) by mouth daily. 04/11/22   Danford, Suann Larry, MD  polyethylene glycol (MIRALAX / GLYCOLAX) 17 g packet Take 17 g by mouth daily. 06/04/22   Shelly Coss, MD  promethazine (PHENERGAN) 25 MG tablet Take 25-50 mg by mouth every 8 (eight) hours as needed for vomiting or nausea. 04/23/22   [provider]  sevelamer (RENAGEL) 800 MG tablet Take 1,600 mg by mouth 3 (three) times daily. 07/06/22   [provider]  zinc sulfate 220 (50 Zn) MG capsule Take 1 capsule (220 mg total) by mouth daily. 04/11/22   Danford, Suann Larry, MD  Insulin NPH Isophane & Regular (RELION 70/30 [place]) Inject 35 Units into the skin 2 (two) times daily.  08/17/14  [provider]     Vitals:   11/18/22 1245 11/18/22 1330 11/18/22 1614 11/18/22 1615  BP: (!) 167/101 (!) 188/85  (!) 167/111  Pulse: 89 87  87  Resp: 18 (!) 21  20  Temp:   97.8 F (36.6 C)   TempSrc:   Temporal   SpO2: 100% 99%  99%  Weight:      Height:       Exam Gen alert, on bipap, WOB stable No rash, cyanosis or gangrene Sclera anicteric, throat clear  No jvd or bruits Chest clear bilat to bases, no rales/ wheezing RRR no MRG Abd soft ntnd no mass or ascites +bs GU defer MS bilat BKA, well healed Ext no LE or UE edema, no wounds or ulcers Neuro is alert, Ox 3 , nf   RIJ TDC intact   Home meds include - norvasc 10, aspirin, lipitor, zetia, dilaudid po prn, insulin glargine/ novolog, imdur 30, labetalol 300 bid,  renavite, protonix, sevelamer 2 ac tid, prns/ vits/  supps     OP HD: [facility] MWF 4h 77mn  400/1.5  97.9kg  2/2 bath TDC RIJ  Hep 5000+ 30074mrun - last HD 12/01, post wt 94.2kg - venofer 10084miw thru 12/06 - calcitriol 0.25 ug po tiw - mircera 150 q2, last 11/29, due in 1.5 wks   Assessment/ Plan: SOB / resp distress / pulm edema /vol overload - pt here on bipap and stable now. CXR shows sig pulm edema. Pt coming off below dry wt recently x 2. Losing body wt. Plan HD this evening, max UF to get vol down.  ESRD - on HD MWF. Last HD Friday, has not missed HD. As above.  HTN/ vol - BP's on the high side, cont home meds. Lower vol w hd PAD - sp bilat BKA. In SNF hoping to walk w/ prosthetics eventually MBD ckd - CCa in range, add on phos. Cont renvela as binder, vdra.  Anemia esrd - Hb 10- 12, will cont IV Fe load while here. Esa just given 4 days ago in OP unit.       RobKelly SplinterD 11/18/2022, 5:23 PM Recent Labs  Lab 11/18/22 1116 11/18/22 1250  HGB 10.7* 11.9*  ALBUMIN 3.5  --   CALCIUM 8.8*  --   CREATININE 4.69*  --   K 4.6 4.3   Inpatient medications:  amLODipine  10 mg Oral Daily   aspirin EC  81 mg Oral Daily   atorvastatin  40 mg Oral Daily   ezetimibe  10 mg Oral Daily   [START ON 11/19/2022] ferrous sulfate  325 mg Oral Q breakfast   heparin  5,000 Units Subcutaneous Q12H   insulin aspart  0-6 Units Subcutaneous TID WC   insulin glargine-yfgn  10 Units Subcutaneous Daily   [START ON 11/19/2022] isosorbide mononitrate  60 mg Oral Daily   labetalol  300 mg Oral BID   pantoprazole  40 mg Oral Daily   polyethylene glycol  17 g Oral Daily   sevelamer carbonate  800 mg Oral TID WC   sodium chloride flush  3 mL Intravenous Q12H    sodium chloride     sodium chloride, acetaminophen, HYDROmorphone, ondansetron, sodium chloride flush

## 2022-11-18 NOTE — ED Notes (Signed)
Pt brief soiled. Pt cleaned, dry brief placed, Pure[place] in place.

## 2022-11-19 ENCOUNTER — Inpatient Hospital Stay (HOSPITAL_COMMUNITY): Payer: Medicare Other

## 2022-11-19 DIAGNOSIS — I5033 Acute on chronic diastolic (congestive) heart failure: Secondary | ICD-10-CM | POA: Diagnosis present

## 2022-11-19 DIAGNOSIS — I2510 Atherosclerotic heart disease of native coronary artery without angina pectoris: Secondary | ICD-10-CM | POA: Diagnosis present

## 2022-11-19 DIAGNOSIS — Z800 Family history of malignant neoplasm of digestive organs: Secondary | ICD-10-CM | POA: Diagnosis not present

## 2022-11-19 DIAGNOSIS — L299 Pruritus, unspecified: Secondary | ICD-10-CM | POA: Diagnosis present

## 2022-11-19 DIAGNOSIS — D631 Anemia in chronic kidney disease: Secondary | ICD-10-CM | POA: Diagnosis present

## 2022-11-19 DIAGNOSIS — Z1152 Encounter for screening for COVID-19: Secondary | ICD-10-CM | POA: Diagnosis not present

## 2022-11-19 DIAGNOSIS — I161 Hypertensive emergency: Secondary | ICD-10-CM | POA: Diagnosis present

## 2022-11-19 DIAGNOSIS — E1165 Type 2 diabetes mellitus with hyperglycemia: Secondary | ICD-10-CM | POA: Diagnosis present

## 2022-11-19 DIAGNOSIS — F1120 Opioid dependence, uncomplicated: Secondary | ICD-10-CM | POA: Diagnosis present

## 2022-11-19 DIAGNOSIS — J9621 Acute and chronic respiratory failure with hypoxia: Secondary | ICD-10-CM | POA: Diagnosis present

## 2022-11-19 DIAGNOSIS — Z794 Long term (current) use of insulin: Secondary | ICD-10-CM | POA: Diagnosis not present

## 2022-11-19 DIAGNOSIS — N184 Chronic kidney disease, stage 4 (severe): Secondary | ICD-10-CM | POA: Diagnosis not present

## 2022-11-19 DIAGNOSIS — I132 Hypertensive heart and chronic kidney disease with heart failure and with stage 5 chronic kidney disease, or end stage renal disease: Secondary | ICD-10-CM | POA: Diagnosis present

## 2022-11-19 DIAGNOSIS — Z992 Dependence on renal dialysis: Secondary | ICD-10-CM | POA: Diagnosis not present

## 2022-11-19 DIAGNOSIS — K5900 Constipation, unspecified: Secondary | ICD-10-CM | POA: Diagnosis present

## 2022-11-19 DIAGNOSIS — Z89511 Acquired absence of right leg below knee: Secondary | ICD-10-CM | POA: Diagnosis not present

## 2022-11-19 DIAGNOSIS — E1142 Type 2 diabetes mellitus with diabetic polyneuropathy: Secondary | ICD-10-CM | POA: Diagnosis present

## 2022-11-19 DIAGNOSIS — G8929 Other chronic pain: Secondary | ICD-10-CM | POA: Diagnosis present

## 2022-11-19 DIAGNOSIS — I501 Left ventricular failure: Secondary | ICD-10-CM | POA: Diagnosis not present

## 2022-11-19 DIAGNOSIS — N186 End stage renal disease: Secondary | ICD-10-CM | POA: Diagnosis present

## 2022-11-19 DIAGNOSIS — J810 Acute pulmonary edema: Secondary | ICD-10-CM | POA: Diagnosis present

## 2022-11-19 DIAGNOSIS — Z89512 Acquired absence of left leg below knee: Secondary | ICD-10-CM | POA: Diagnosis not present

## 2022-11-19 DIAGNOSIS — E785 Hyperlipidemia, unspecified: Secondary | ICD-10-CM | POA: Diagnosis present

## 2022-11-19 DIAGNOSIS — E1149 Type 2 diabetes mellitus with other diabetic neurological complication: Secondary | ICD-10-CM | POA: Diagnosis present

## 2022-11-19 DIAGNOSIS — I509 Heart failure, unspecified: Secondary | ICD-10-CM

## 2022-11-19 DIAGNOSIS — E1122 Type 2 diabetes mellitus with diabetic chronic kidney disease: Secondary | ICD-10-CM | POA: Diagnosis present

## 2022-11-19 DIAGNOSIS — I160 Hypertensive urgency: Secondary | ICD-10-CM

## 2022-11-19 DIAGNOSIS — E1151 Type 2 diabetes mellitus with diabetic peripheral angiopathy without gangrene: Secondary | ICD-10-CM | POA: Diagnosis present

## 2022-11-19 LAB — RENAL FUNCTION PANEL
Albumin: 3.5 g/dL (ref 3.5–5.0)
Anion gap: 9 (ref 5–15)
BUN: 17 mg/dL (ref 8–23)
CO2: 29 mmol/L (ref 22–32)
Calcium: 9 mg/dL (ref 8.9–10.3)
Chloride: 99 mmol/L (ref 98–111)
Creatinine, Ser: 4.04 mg/dL — ABNORMAL HIGH (ref 0.44–1.00)
GFR, Estimated: 12 mL/min — ABNORMAL LOW (ref >60)
Glucose, Bld: 153 mg/dL — ABNORMAL HIGH (ref 70–99)
Phosphorus: 3.3 mg/dL (ref 2.5–4.6)
Potassium: 3.7 mmol/L (ref 3.5–5.1)
Sodium: 137 mmol/L (ref 135–145)

## 2022-11-19 LAB — BASIC METABOLIC PANEL
Anion gap: 10 (ref 5–15)
BUN: 13 mg/dL (ref 8–23)
CO2: 32 mmol/L (ref 22–32)
Calcium: 9.3 mg/dL (ref 8.9–10.3)
Chloride: 97 mmol/L — ABNORMAL LOW (ref 98–111)
Creatinine, Ser: 3.33 mg/dL — ABNORMAL HIGH (ref 0.44–1.00)
GFR, Estimated: 15 mL/min — ABNORMAL LOW (ref >60)
Glucose, Bld: 122 mg/dL — ABNORMAL HIGH (ref 70–99)
Potassium: 3.8 mmol/L (ref 3.5–5.1)
Sodium: 139 mmol/L (ref 135–145)

## 2022-11-19 LAB — GLUCOSE, CAPILLARY
Glucose-Capillary: 126 mg/dL — ABNORMAL HIGH (ref 70–99)
Glucose-Capillary: 107 mg/dL — ABNORMAL HIGH (ref 70–99)
Glucose-Capillary: 159 mg/dL — ABNORMAL HIGH (ref 70–99)

## 2022-11-19 LAB — CBC
HCT: 34.2 % — ABNORMAL LOW (ref 36.0–46.0)
Hemoglobin: 9.9 g/dL — ABNORMAL LOW (ref 12.0–15.0)
MCH: 27.1 pg (ref 26.0–34.0)
MCHC: 28.9 g/dL — ABNORMAL LOW (ref 30.0–36.0)
MCV: 93.7 fL (ref 80.0–100.0)
Platelets: 232 10*3/uL (ref 150–400)
RBC: 3.65 MIL/uL — ABNORMAL LOW (ref 3.87–5.11)
RDW: 18.7 % — ABNORMAL HIGH (ref 11.5–15.5)
WBC: 9.8 10*3/uL (ref 4.0–10.5)
nRBC: 0 % (ref 0.0–0.2)

## 2022-11-19 LAB — HEMOGLOBIN A1C
Hgb A1c MFr Bld: 4.9 % (ref 4.8–5.6)
Mean Plasma Glucose: 94 mg/dL

## 2022-11-19 MED ORDER — DICLOFENAC SODIUM 1 % EX GEL: 2.0000 g | Freq: Three times a day (TID) | CUTANEOUS | Status: DC | PRN

## 2022-11-19 MED ORDER — ANTICOAGULANT SODIUM CITRATE 4% (200MG/5ML) IV SOLN
5.0000 mL | Status: DC | PRN
  Filled 2022-11-19: qty 5

## 2022-11-19 MED ORDER — HEPARIN SODIUM (PORCINE) 1000 UNIT/ML IJ SOLN
INTRAMUSCULAR | Status: AC
  Administered 2022-11-19: 1000 [IU]
  Filled 2022-11-19: qty 4

## 2022-11-19 MED ORDER — CHLORHEXIDINE GLUCONATE CLOTH 2 % EX PADS
6.0000 | MEDICATED_PAD | Freq: Every day | CUTANEOUS | Status: DC
  Administered 2022-11-19 – 2022-11-22 (×4): 6 via TOPICAL

## 2022-11-19 MED ORDER — DIPHENHYDRAMINE HCL 25 MG PO CAPS
25.0000 mg | ORAL_CAPSULE | Freq: Four times a day (QID) | ORAL | Status: DC | PRN
  Administered 2022-11-19: 25 mg via ORAL
  Filled 2022-11-19: qty 1

## 2022-11-19 MED ORDER — HEPARIN SODIUM (PORCINE) 1000 UNIT/ML DIALYSIS
1000.0000 [IU] | INTRAMUSCULAR | Status: DC | PRN
  Filled 2022-11-19: qty 1

## 2022-11-19 MED ORDER — ALTEPLASE 2 MG IJ SOLR: 2.0000 mg | Freq: Once | INTRAMUSCULAR | Status: DC | PRN

## 2022-11-19 NOTE — Progress Notes (Signed)
Patient returned from hemodialysis, currently on 3L nasal cannula tolerating well. BIPAP is on stand by for patient use, RT will continue to monitor patient.

## 2022-11-19 NOTE — Progress Notes (Signed)
Patient transported to dialysis on bipap without any complications.

## 2022-11-19 NOTE — NC FL2 (Signed)
[place] LEVEL OF CARE FORM     IDENTIFICATION  Patient Name: Nancy [name] Birthdate: [birth year] Sex: female Admission Date (Current Location): 11/18/2022  [facility] [place] Number:  Herbalist and Address:  [facility] [place]. [facility], [place] [address], [place], [place] [address]      Provider Number: [id number]  Attending Physician Name and Address:  Flora Lipps, MD  Relative Name and Phone Number:       Current Level of Care: Hospital Recommended Level of Care: [place] Prior Approval Number:    Date Approved/Denied:   PASRR Number: [id number]  Discharge Plan: SNF    Current Diagnoses: Patient Active Problem List   Diagnosis Date Noted   Acute respiratory failure with hypoxia ([place]) 11/04/2022   Pain, unspecified 25/00/3704   Complication of vascular dialysis catheter 08/07/2022   Chest pain 05/30/2022   Fluid overload 05/30/2022   Acute pulmonary edema ([place]) 05/30/2022   Iron deficiency anemia, unspecified 04/26/2022   Moderate protein-calorie malnutrition ([place]) 04/18/2022   Nausea 04/10/2022   Hyponatremia 04/04/2022   Prolonged QT interval 04/04/2022   Anaphylactic shock, unspecified, initial encounter 02/23/2022   Coagulation defect, unspecified ([place]) 02/23/2022   Other allergy, initial encounter 02/23/2022   Pressure injury of skin 02/19/2022   Anemia in chronic kidney disease 02/16/2022   Dependence on renal dialysis ([place]) 02/16/2022   Hyperlipidemia, unspecified 02/16/2022   Hypertensive heart and chronic kidney disease with heart failure and with stage 5 chronic kidney disease, or end stage renal disease ([place]) 02/16/2022   Personal history of nicotine dependence 02/16/2022   Personal history of transient ischemic attack (TIA), and cerebral infarction without residual deficits 02/16/2022   Secondary hyperparathyroidism of renal origin ([place]) 02/16/2022   Type 2 diabetes mellitus with diabetic  peripheral angiopathy without gangrene ([place]) 02/16/2022   Renal anasarca 02/14/2022   ESRD on dialysis ([place]) 02/14/2022   Hyperkalemia 02/14/2022   S/P arteriovenous (AV) fistula creation 12/22/2021   Critical limb ischemia of both lower extremities ([place]) 11/22/2021   Subacute osteomyelitis, right ankle and foot ([place])    Peripheral artery disease ([place]) 10/27/2021   Acute exacerbation of congestive heart failure ([place]) 10/24/2021   Acute cardiogenic pulmonary edema ([place]) 10/06/2021   CAD (coronary artery disease) 10/06/2021   Goals of care, counseling/discussion    Gangrene of left foot ([place])    Cellulitis of left foot    Diabetic foot ulcers ([place]) 09/04/2021   CHF (congestive heart failure) ([place]) 07/07/2021   Acute postoperative anemia due to expected blood loss superimposed on anemia of chronic renal insufficiency 06/30/2021   CKD (chronic kidney disease), stage IV (HCC) 06/11/2021   Chronic diastolic CHF (congestive heart failure) ([place]) 06/10/2021   Abnormal nuclear stress test    Dyspnea on exertion 03/07/2021   Orthopnea 02/25/2021   History of cerebrovascular accident (CVA) with residual deficit 05/06/2020   Diabetic peripheral neuropathy ([place]) 04/26/2020   AKI (acute kidney injury) ([place]) 04/20/2020   Generalized weakness 04/20/2020   Dehydration 04/20/2020   Generalized abdominal pain    Pancreatitis, acute 02/29/2020   Cerebrovascular accident (CVA) ([place]) 11/08/2019   Stenosis of right carotid artery 11/08/2019   Mixed diabetic hyperlipidemia associated with type 2 diabetes mellitus ([place]) 11/08/2019   Cortical age-related cataract of both eyes 08/30/2019   Gait abnormality 08/30/2019   Statin declined 08/30/2019   Gastroesophageal reflux disease without esophagitis 04/18/2018   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   Insulin dependent type 2 diabetes mellitus (  [place]) 10/29/2017   History of macular degeneration 10/29/2017   Chronic cervical pain 10/29/2016   Myalgia 09/14/2016    Insomnia 09/14/2016   Tinea pedis of both feet 08/20/2016   Macular degeneration 08/17/2016   Low back pain 09/21/2014   Hypertensive urgency 09/21/2014   Diabetic gastroparesis associated with type 2 diabetes mellitus ([place]) 09/14/2014   HTN (hypertension) 09/08/2014   Thyroid nodule 08/17/2014   Morbid obesity ([place]) 08/17/2014   Hypokalemia 08/06/2014   Type II diabetes mellitus with neurological manifestations, uncontrolled 08/04/2014    Orientation RESPIRATION BLADDER Height & Weight     Self, Time, Situation, Place  O2 (2L [place]) Continent Weight: 200 lb 2.8 oz (90.8 kg) Height:  '5\' 3"'$  (160 cm)  BEHAVIORAL SYMPTOMS/MOOD NEUROLOGICAL BOWEL NUTRITION STATUS      Continent Diet (see d/c summary)  AMBULATORY STATUS COMMUNICATION OF NEEDS Skin   Extensive Assist Verbally Other (Comment) (Irritant dermititis(moisture associated skin damage) anus)                       Personal Care Assistance Level of Assistance  Bathing, Feeding, Dressing Bathing Assistance: Maximum assistance Feeding assistance: Limited assistance Dressing Assistance: Maximum assistance     Functional Limitations Info  Sight, Hearing, Speech Sight Info: Impaired Hearing Info: Adequate Speech Info: Adequate    SPECIAL CARE FACTORS FREQUENCY                       Contractures Contractures Info: Not present    Additional Factors Info  Code Status, Allergies Code Status Info: full code Allergies Info: Hydralazine, Hydralazine Hcl, Hydrocodone, Metformin And Related, Other, Plaquenil (Hydroxychloroquine Sulfate), Shellfish Allergy, Shellfish-derived Products, Shrimp (Diagnostic), Sulfa Antibiotics           Current Medications (11/19/2022):  This is the current hospital active medication list Current Facility-Administered Medications  Medication Dose Route Frequency Provider Last Rate Last Admin   0.9 %  sodium chloride infusion  250 mL Intravenous PRN Wynetta Fines T, MD       acetaminophen  (TYLENOL) tablet 650 mg  650 mg Oral Q4H PRN Wynetta Fines T, MD       alteplase (CATHFLO ACTIVASE) injection 2 mg  2 mg Intracatheter Once PRN Justin Mend, MD       amLODipine (NORVASC) tablet 10 mg  10 mg Oral Daily Wynetta Fines T, MD       anticoagulant sodium citrate solution 5 mL  5 mL Intracatheter PRN Justin Mend, MD       aspirin EC tablet 81 mg  81 mg Oral Daily Wynetta Fines T, MD       atorvastatin (LIPITOR) tablet 40 mg  40 mg Oral Daily Wynetta Fines T, MD       calcitRIOL (ROCALTROL) capsule 0.25 mcg  0.25 mcg Oral Q M,W,F-HD Roney Jaffe, MD       Chlorhexidine Gluconate Cloth 2 % PADS 6 each  6 each Topical Q0600 Justin Mend, MD       ezetimibe (ZETIA) tablet 10 mg  10 mg Oral Daily Wynetta Fines T, MD       ferric gluconate (FERRLECIT) 125 mg in sodium chloride 0.9 % 100 mL IVPB  125 mg Intravenous Q M,W,F-HD Roney Jaffe, MD       ferrous sulfate tablet 325 mg  325 mg Oral Q breakfast Wynetta Fines T, MD       heparin injection 1,000 Units  1,000 Units Intracatheter  PRN Justin Mend, MD       heparin injection 5,000 Units  5,000 Units Subcutaneous Q12H Wynetta Fines T, MD   5,000 Units at 11/18/22 1616   heparin sodium (porcine) 1000 UNIT/ML injection            HYDROmorphone (DILAUDID) tablet 2-3 mg  2-3 mg Oral Q4H PRN Wynetta Fines T, MD   2 mg at 11/19/22 0416   insulin aspart (novoLOG) injection 0-6 Units  0-6 Units Subcutaneous TID WC Wynetta Fines T, MD       insulin glargine-yfgn [facility]) injection 10 Units  10 Units Subcutaneous Daily Wynetta Fines T, MD   10 Units at 11/18/22 1615   isosorbide mononitrate (IMDUR) 24 hr tablet 60 mg  60 mg Oral Daily Wynetta Fines T, MD       labetalol (NORMODYNE) tablet 300 mg  300 mg Oral BID Wynetta Fines T, MD       ondansetron [facility]) tablet 8 mg  8 mg Oral Q8H PRN Wynetta Fines T, MD       pantoprazole (PROTONIX) EC tablet 40 mg  40 mg Oral Daily Zhang, Pearletha Forge T, MD       polyethylene glycol (MIRALAX / GLYCOLAX) packet 17 g   17 g Oral Daily Wynetta Fines T, MD       sevelamer carbonate (RENVELA) tablet 800 mg  800 mg Oral TID WC Wynetta Fines T, MD       sodium chloride flush (NS) 0.9 % injection 3 mL  3 mL Intravenous Q12H Wynetta Fines T, MD   3 mL at 11/18/22 1615   sodium chloride flush (NS) 0.9 % injection 3 mL  3 mL Intravenous PRN Lequita Halt, MD         Discharge Medications: Please see discharge summary for a list of discharge medications.  Relevant Imaging Results:  Relevant Lab Results:   Additional Information SSN: [id number]  Bethann Berkshire, LCSW

## 2022-11-19 NOTE — Progress Notes (Signed)
Received patient in bed to unit.  Alert and oriented.  Informed consent signed and in chart.    Patient tolerated well.  Transported back to the room  Alert, without acute distress.  Hand-off given to patient's nurse.   Access used: [facility] Access issues: none  Total UF removed: 2L Medication(s) given: Calcitriol Post HD VS: BP: 157/81, Pulse: 88, RR: 16, O2sat: [facility]

## 2022-11-19 NOTE — Progress Notes (Signed)
PROGRESS NOTE    Nancy [name]  XBD:[id number] DOB: [birth year] DOA: 11/18/2022 PCP: [name], MD    Brief Narrative:  Nancy [name] is a 61 y.o. female with medical history significant of ESRD on hemodialysis Monday Wednesday Friday, hypertension, chronic heart failure with preserved ejection fraction, hyperlipidemia, coronary artery disease, type 2 diabetes,  PVD status post bilateral BKA, presented to the hospital with shortness of breath.  Patient had normal schedule dialysis on Friday but then he started having acute shortness of breath orthopnea.  EMS noted that patient was hypoxic in the 30s and was put on nonrebreather mask.  Chest x-ray in the ED showed pulmonary edema.  Patient was put on BiPAP and nephrology was consulted for emergent hemodialysis.  Initial blood pressure was 180.  Assessment and plan  Acute on chronic HFpEF decompensation Patient had pulmonary edema, hypertensive emergency.  Nephrology was consulted for emergency dialysis.  Lasix was also given.  COVID and influenza was negative.  ABG showed pH of 7.3 with pCO2 of 58.  No leukocytosis or fever.  Patient will undergo hemodialysis today/tomorrow morning.  Will continue to monitor the patient closely.  HTN emergency Patient is on amlodipine, labetalol,of Imdur.  Unfortunately she is allergic to hydralazine (hair loss?).  Dose of Imdur was increased to 60 blood pressure has improved after hemodialysis therapy.   Diabetes mellitus type 2 with hyperglycemia. Continue sliding scale insulin while in the hospital diabetic diet.  Closely monitor blood glucose levels.  ESRD on HD Continue hemodialysis for volume management.Marland Kitchen   Hx of CVA -Continue aspirin Zetia atorvastatin   PVD and B/L BKA Continue aspirin and Zetia Lipitor.  Working on prosthesis.  Currently at the skilled nursing facility.     DVT prophylaxis: heparin injection 5,000 Units Start: 11/18/22 1500   Code Status:     Code  Status: Full Code  Disposition: Skilled nursing facility likely on 11/20/2022.  Patient is from [facility].  TOC involved.  Status is: Inpatient  Remains inpatient appropriate because: Hemodialysis for volume management, accelerated hypotension   Family Communication: None at bedside.  Consultants:  Nephrology  Procedures:  Hemodialysis  Antimicrobials:  None  Anti-infectives (From admission, onward)    None       Subjective: Today, patient was seen and examined at bedside.  Patient denies any chest pain, nausea, vomiting, fever or chills.  Has mild shortness of breath.  On supplemental oxygen  Objective: Vitals:   11/19/22 0213 11/19/22 0416 11/19/22 0918 11/19/22 1140  BP:  (!) 154/94 (!) 156/82 (!) 158/103  Pulse:  91    Resp:  16    Temp:  99.8 F (37.7 C)    TempSrc:  Oral    SpO2:  99%    Weight: 90.8 kg     Height:        Intake/Output Summary (Last 24 hours) at 11/19/2022 1329 Last data filed at 11/19/2022 0417 Gross per 24 hour  Intake 360 ml  Output 3100 ml  Net -2740 ml   Filed Weights   11/18/22 1113 11/19/22 0213  Weight: 99.8 kg 90.8 kg    Physical Examination: Body mass index is 35.46 kg/m.  General: Obese built, not in obvious distress, nasal cannula oxygen HENT:   No scleral pallor or icterus noted. Oral mucosa is moist.  Chest:    Diminished breath sounds bilaterally. No crackles or wheezes.  CVS: S1 &S2 heard. No murmur.  Regular rate and rhythm. Abdomen: Soft, nontender, nondistended.  Bowel sounds are heard.   Extremities: Bilateral below-knee amputation. Psych: Alert, awake and oriented, normal mood CNS:  No cranial nerve deficits.  Power equal in all extremities.   Skin: Warm and dry.  No rashes noted.  Data Reviewed:   CBC: Recent Labs  Lab 11/18/22 1116 11/18/22 1250  WBC 10.5  --   NEUTROABS 8.0*  --   HGB 10.7* 11.9*  HCT 36.9 35.0*  MCV 92.9  --   PLT 225  --     Basic Metabolic Panel: Recent Labs  Lab  11/18/22 1116 11/18/22 1250 11/18/22 1406 11/19/22 0645  NA 138 139  --  139  K 4.6 4.3  --  3.8  CL 99  --   --  97*  CO2 28  --   --  32  GLUCOSE 195*  --   --  122*  BUN 22  --   --  13  CREATININE 4.69*  --   --  3.33*  CALCIUM 8.8*  --   --  9.3  PHOS  --   --  3.9  --     Liver Function Tests: Recent Labs  Lab 11/18/22 1116  AST 23  ALT 9  ALKPHOS 89  BILITOT 1.1  PROT 7.0  ALBUMIN 3.5     Radiology Studies: DG Chest Portable 1 View  Result Date: 11/18/2022 CLINICAL DATA:  Shortness of breath.  Patient is on CPAP. EXAM: PORTABLE CHEST 1 VIEW COMPARISON:  November 04, 2022 FINDINGS: The heart size and mediastinal contours are stable. Right central venous line is unchanged. Diffuse bilateral airspace disease identified in both lungs slightly worse in the left mid and lung base compared to prior exam. Probable small bilateral pleural effusions. The visualized skeletal structures are stable. IMPRESSION: Diffuse bilateral airspace disease identified in both lungs slightly worse in the left mid and lung base compared to prior exam, favor pulmonary edema. Superimposed pneumonia especially at the lung base is not excluded. Electronically Signed   By: Abelardo Diesel M.D.   On: 11/18/2022 11:45      LOS: 0 days    Flora Lipps, MD [facility] Available via Epic secure chat 7am-7pm After these hours, please refer to coverage provider listed on [email or web address] 11/19/2022, 1:29 PM

## 2022-11-19 NOTE — Progress Notes (Signed)
Heart Failure Navigator Progress Note  Assessed for Heart & Vascular TOC clinic readiness.  Patient does not meet criteria due to ESRD on HD.   HF Navigation team will sign-off.   Raziel Koenigs, MSN, RN Heart Failure Nurse Navigator   

## 2022-11-19 NOTE — Progress Notes (Signed)
[facility] Progress Note   Subjective:   Feeling improved c/w yesterday but not back to baseline.  Only new symptom is axillary pruritus - no rashes noted.    Objective Vitals:   11/19/22 0005 11/19/22 0122 11/19/22 0213 11/19/22 0416  BP: (!) 162/76   (!) 154/94  Pulse: 82   91  Resp: 16   16  Temp:  (P) 98.8 F (37.1 C)  99.8 F (37.7 C)  TempSrc:  (P) Oral  Oral  SpO2: 100%   99%  Weight:   90.8 kg   Height:       Physical Exam General: comfortable in bed sitting up on 2L [place] Heart: RRR Lungs: dec BS bases, normal WOB at rest but sl inc with conversation Abdomen: soft Extremities: trace edema Dialysis Access:  RUE AVF +t/b, [facility] c/d/i  Additional Objective Labs: Basic Metabolic Panel: Recent Labs  Lab 11/18/22 1116 11/18/22 1250 11/18/22 1406 11/19/22 0645  NA 138 139  --  139  K 4.6 4.3  --  3.8  CL 99  --   --  97*  CO2 28  --   --  32  GLUCOSE 195*  --   --  122*  BUN 22  --   --  13  CREATININE 4.69*  --   --  3.33*  CALCIUM 8.8*  --   --  9.3  PHOS  --   --  3.9  --    Liver Function Tests: Recent Labs  Lab 11/18/22 1116  AST 23  ALT 9  ALKPHOS 89  BILITOT 1.1  PROT 7.0  ALBUMIN 3.5   No results for input(s): "LIPASE", "AMYLASE" in the last 168 hours. CBC: Recent Labs  Lab 11/18/22 1116 11/18/22 1250  WBC 10.5  --   NEUTROABS 8.0*  --   HGB 10.7* 11.9*  HCT 36.9 35.0*  MCV 92.9  --   PLT 225  --    Blood Culture    Component Value Date/Time   SDES BLOOD LEFT ANTECUBITAL 03/29/2022 1839   SPECREQUEST  03/29/2022 1839    BOTTLES DRAWN AEROBIC ONLY Blood Culture adequate volume   CULT  03/29/2022 1839    NO GROWTH 5 DAYS Performed at [facility], [place] [address] [place], [place] [address]    REPTSTATUS 04/03/2022 FINAL 03/29/2022 1839    Cardiac Enzymes: No results for input(s): "CKTOTAL", "CKMB", "CKMBINDEX", "TROPONINI" in the last 168 hours. CBG: Recent Labs  Lab 11/18/22 1745 11/18/22 2100  11/19/22 0555  GLUCAP 116* 96 126*   Iron Studies: No results for input(s): "IRON", "TIBC", "TRANSFERRIN", "FERRITIN" in the last 72 hours. '@lablastinr3'$ @ Studies/Results: DG Chest Portable 1 View  Result Date: 11/18/2022 CLINICAL DATA:  Shortness of breath.  Patient is on CPAP. EXAM: PORTABLE CHEST 1 VIEW COMPARISON:  November 04, 2022 FINDINGS: The heart size and mediastinal contours are stable. Right central venous line is unchanged. Diffuse bilateral airspace disease identified in both lungs slightly worse in the left mid and lung base compared to prior exam. Probable small bilateral pleural effusions. The visualized skeletal structures are stable. IMPRESSION: Diffuse bilateral airspace disease identified in both lungs slightly worse in the left mid and lung base compared to prior exam, favor pulmonary edema. Superimposed pneumonia especially at the lung base is not excluded. Electronically Signed   By: Abelardo Diesel M.D.   On: 11/18/2022 11:45   Medications:  sodium chloride     ferric gluconate (FERRLECIT) IVPB  amLODipine  10 mg Oral Daily   aspirin EC  81 mg Oral Daily   atorvastatin  40 mg Oral Daily   calcitRIOL  0.25 mcg Oral Q M,W,F-HD   Chlorhexidine Gluconate Cloth  6 each Topical Q0600   ezetimibe  10 mg Oral Daily   ferrous sulfate  325 mg Oral Q breakfast   heparin  5,000 Units Subcutaneous Q12H   heparin sodium (porcine)       insulin aspart  0-6 Units Subcutaneous TID WC   insulin glargine-yfgn  10 Units Subcutaneous Daily   isosorbide mononitrate  60 mg Oral Daily   labetalol  300 mg Oral BID   pantoprazole  40 mg Oral Daily   polyethylene glycol  17 g Oral Daily   sevelamer carbonate  800 mg Oral TID WC   sodium chloride flush  3 mL Intravenous Q12H    OP HD: [facility] MWF 4h 21mn  400/1.5  97.9kg  2/2 bath TDC RIJ  Hep 5000+ 30056mrun - last HD 12/01, post wt 94.2kg - venofer '100mg'$  tiw thru 12/06 - calcitriol 0.25 ug po tiw - mircera 150 q2, last 11/29, due  in 1.5 wks     Assessment/ Plan: SOB / resp distress / pulm edema /vol overload -  tolerated UF 3.1L overnight - post wt recorded as 90.8kg - bed wt.  Needs lower EDW in setting of recent wt loss. On fluid restriction.  Still with some volume so will plan for extra HD today for additional fluid removal.  SHould be back to baseline after that and hopefully can d/c.  ESRD - on HD MWF. Last HD Friday, has not missed HD. As above had HD Sun PM and will plan for additional UF session today if able based on schedule/ o/w first thing Tues and hopefully can d/c to outpt unit for wed tx with new EDW.  HTN/ vol - BP's on the high side, cont home meds. Lower vol w hd PAD - sp bilat BKA. 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 Cont renvela 1 TIDAC as binder, vdra.  Anemia esrd - Hb 10- 12, will cont IV Fe load while here. Esa just given 4 days ago in OP unit.   LiJannifer HickD 11/19/2022, 9:49 AM  [facility] Pager: [phone]

## 2022-11-19 NOTE — Hospital Course (Addendum)
Nancy [name] is a 61 y.o. female with medical history significant of ESRD on hemodialysis Monday Wednesday Friday, hypertension, chronic heart failure with preserved ejection fraction, hyperlipidemia, coronary artery disease, type 2 diabetes,  PVD status post bilateral BKA, presented to the hospital with shortness of breath.  Patient had normal schedule dialysis on Friday but then he started having acute shortness of breath orthopnea.  EMS noted that patient was hypoxic in the 30s and was put on nonrebreather mask.  Chest x-ray in the ED showed pulmonary edema.  Patient was put on BiPAP and nephrology was consulted for emergent hemodialysis.  Initial blood pressure was 180.  Assessment and plan  Acute on chronic HFpEF decompensation Patient had pulmonary edema hypertensive emergency.  Nephrology was consulted for emergency dialysis.  Lasix was also given.  COVID and influenza was negative.  ABG showed pH of 7.3 with pCO2 of 58.  No leukocytosis or fever.  HTN emergency Patient is on amlodipine, labetalol,of Imdur.  Unfortunately she is allergic to hydralazine (hair loss?).  Dose of Imdur was increased to 60.   Diabetes mellitus type 2 with hyperglycemia. Sliding scale insulin while in the hospital  ESRD on HD Continue hemodialysis.   Hx of CVA -Continue aspirin Zetia atorvastatin   PVD and B/L BKA Continue aspirin and Zetia Lipitor.

## 2022-11-19 NOTE — Progress Notes (Signed)
TRH night cross cover note:   I was notified by patient's RN of temperature of 100.7 shortly after shift change this evening.  Patient is currently without acute complaint, will noting  Per my chart review, including review of most recent rounding hospitalist progress note,  ***      Babs Bertin, DO Hospitalist

## 2022-11-20 DIAGNOSIS — I509 Heart failure, unspecified: Secondary | ICD-10-CM | POA: Diagnosis not present

## 2022-11-20 DIAGNOSIS — I501 Left ventricular failure: Secondary | ICD-10-CM | POA: Diagnosis not present

## 2022-11-20 DIAGNOSIS — I5033 Acute on chronic diastolic (congestive) heart failure: Secondary | ICD-10-CM | POA: Diagnosis not present

## 2022-11-20 DIAGNOSIS — N184 Chronic kidney disease, stage 4 (severe): Secondary | ICD-10-CM | POA: Diagnosis not present

## 2022-11-20 LAB — BASIC METABOLIC PANEL
Anion gap: 12 (ref 5–15)
BUN: 21 mg/dL (ref 8–23)
CO2: 27 mmol/L (ref 22–32)
Calcium: 9.1 mg/dL (ref 8.9–10.3)
Chloride: 98 mmol/L (ref 98–111)
Creatinine, Ser: 4.31 mg/dL — ABNORMAL HIGH (ref 0.44–1.00)
GFR, Estimated: 11 mL/min — ABNORMAL LOW (ref >60)
Glucose, Bld: 137 mg/dL — ABNORMAL HIGH (ref 70–99)
Potassium: 3.8 mmol/L (ref 3.5–5.1)
Sodium: 137 mmol/L (ref 135–145)

## 2022-11-20 LAB — GLUCOSE, CAPILLARY
Glucose-Capillary: 136 mg/dL — ABNORMAL HIGH (ref 70–99)
Glucose-Capillary: 118 mg/dL — ABNORMAL HIGH (ref 70–99)
Glucose-Capillary: 150 mg/dL — ABNORMAL HIGH (ref 70–99)
Glucose-Capillary: 200 mg/dL — ABNORMAL HIGH (ref 70–99)

## 2022-11-20 MED ORDER — ISOSORBIDE MONONITRATE ER 60 MG PO TB24
120.0000 mg | ORAL_TABLET | Freq: Every day | ORAL | Status: DC
  Administered 2022-11-21 – 2022-11-22 (×2): 120 mg via ORAL
  Filled 2022-11-20 (×2): qty 2

## 2022-11-20 NOTE — Progress Notes (Signed)
Patient placed on full face BIPAP 12/6 30% and tolerating well at this time.

## 2022-11-20 NOTE — Progress Notes (Addendum)
Pt receives out-pt HD at [facility] on MWF. Pt to return to snf at d/c. Will assist as needed.   Melven Sartorius Renal Navigator [phone]

## 2022-11-20 NOTE — Progress Notes (Addendum)
[facility] Progress Note   Subjective:   Feeling back to baseline this AM. UF 2L yesterday with HD.  T 100.7 last PM - pt declined CXR.  No new symptoms except nausea this AM but was able to eat breakfast.  WBC 9.8 this AM, T 99 this AM.   Objective Vitals:   11/20/22 0311 11/20/22 0356 11/20/22 0838 11/20/22 0853  BP:  (!) 189/84  (!) 185/97  Pulse:  77 86 87  Resp: '18 18 20 18  '$ Temp:  98.4 F (36.9 C)  99 F (37.2 C)  TempSrc:  Oral  Oral  SpO2:  98% 98% 100%  Weight:  90.2 kg    Height:       Physical Exam General: comfortable in bed sitting up on 3L [place] - says on 4L at night typically Heart: RRR Lungs: dec BS bases, normal WOB at rest Abdomen: soft Extremities: trace edema Dialysis Access:  RUE AVF +t/b - on smaller side, TDC c/d/i  Additional Objective Labs: Basic Metabolic Panel: Recent Labs  Lab 11/18/22 1406 11/19/22 0645 11/19/22 2057 11/20/22 0139  NA  --  139 137 137  K  --  3.8 3.7 3.8  CL  --  97* 99 98  CO2  --  32 29 27  GLUCOSE  --  122* 153* 137*  BUN  --  '13 17 21  '$ CREATININE  --  3.33* 4.04* 4.31*  CALCIUM  --  9.3 9.0 9.1  PHOS 3.9  --  3.3  --     Liver Function Tests: Recent Labs  Lab 11/18/22 1116 11/19/22 2057  AST 23  --   ALT 9  --   ALKPHOS 89  --   BILITOT 1.1  --   PROT 7.0  --   ALBUMIN 3.5 3.5    No results for input(s): "LIPASE", "AMYLASE" in the last 168 hours. CBC: Recent Labs  Lab 11/18/22 1116 11/18/22 1250 11/19/22 1550  WBC 10.5  --  9.8  NEUTROABS 8.0*  --   --   HGB 10.7* 11.9* 9.9*  HCT 36.9 35.0* 34.2*  MCV 92.9  --  93.7  PLT 225  --  232    Blood Culture    Component Value Date/Time   SDES BLOOD LEFT ANTECUBITAL 03/29/2022 1839   SPECREQUEST  03/29/2022 1839    BOTTLES DRAWN AEROBIC ONLY Blood Culture adequate volume   CULT  03/29/2022 1839    NO GROWTH 5 DAYS Performed at [facility], [place] [address], [place], [place] [address]    REPTSTATUS 04/03/2022 FINAL  03/29/2022 1839    Cardiac Enzymes: No results for input(s): "CKTOTAL", "CKMB", "CKMBINDEX", "TROPONINI" in the last 168 hours. CBG: Recent Labs  Lab 11/18/22 2100 11/19/22 0555 11/19/22 1139 11/19/22 2142 11/20/22 0616  GLUCAP 96 126* 107* 159* 136*    Iron Studies: No results for input(s): "IRON", "TIBC", "TRANSFERRIN", "FERRITIN" in the last 72 hours. '@lablastinr3'$ @ Studies/Results: DG Chest Portable 1 View  Result Date: 11/18/2022 CLINICAL DATA:  Shortness of breath.  Patient is on CPAP. EXAM: PORTABLE CHEST 1 VIEW COMPARISON:  November 04, 2022 FINDINGS: The heart size and mediastinal contours are stable. Right central venous line is unchanged. Diffuse bilateral airspace disease identified in both lungs slightly worse in the left mid and lung base compared to prior exam. Probable small bilateral pleural effusions. The visualized skeletal structures are stable. IMPRESSION: Diffuse bilateral airspace disease identified in both lungs slightly worse in the left mid  and lung base compared to prior exam, favor pulmonary edema. Superimposed pneumonia especially at the lung base is not excluded. Electronically Signed   By: Abelardo Diesel M.D.   On: 11/18/2022 11:45   Medications:  sodium chloride     ferric gluconate (FERRLECIT) IVPB 125 mg (11/19/22 2316)    amLODipine  10 mg Oral Daily   aspirin EC  81 mg Oral Daily   atorvastatin  40 mg Oral Daily   calcitRIOL  0.25 mcg Oral Q M,W,F-HD   Chlorhexidine Gluconate Cloth  6 each Topical Q0600   ezetimibe  10 mg Oral Daily   ferrous sulfate  325 mg Oral Q breakfast   heparin  5,000 Units Subcutaneous Q12H   insulin aspart  0-6 Units Subcutaneous TID WC   insulin glargine-yfgn  10 Units Subcutaneous Daily   isosorbide mononitrate  60 mg Oral Daily   labetalol  300 mg Oral BID   pantoprazole  40 mg Oral Daily   polyethylene glycol  17 g Oral Daily   sevelamer carbonate  800 mg Oral TID WC   sodium chloride flush  3 mL Intravenous Q12H     OP HD: [facility] MWF 4h 67mn  400/1.5  97.9kg  2/2 bath TDC RIJ  Hep 5000+ 30046mrun - last HD 12/01, post wt 94.2kg - venofer '100mg'$  tiw thru 12/06 - calcitriol 0.25 ug po tiw - mircera 150 q2, last 11/29, due in 1.5 wks     Assessment/ Plan: SOB / resp distress / pulm edema /vol overload -  HD completed Sun and extra Mon - per I/Os net neg 4.6L this admission.  On O2 qhs typically.  ESRD - on HD MWF. Had HD Sun and Mon mainly for volume - will need new EDW. Wts here significantly below EDW.  97.9kg vs 87.7 post HD yest.  [facility] - has AVF that has been accessed with 17g needle x1 at HD unit.  Ok to continue new AVF protocol at d/c/ HTN/ vol - remains HTN despite volume unloading. ^ imdur.  PAD - sp bilat BKA. 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 Cont renvela 1 TIDAC as binder, vdra.  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 Esa just given 4 days ago in OP unit.  Plans for SNF at discharge - she came from SNF.  If discharges today can go to outpt HD tomorrow.  If not we will do HD here tomorrow.   Nancy [name] 11/20/2022, 10:10 AM  [facility] Pager: [phone]

## 2022-11-20 NOTE — TOC Initial Note (Signed)
Transition of Care [facility]) - Initial/Assessment Note    Patient Details  Name: Nancy [name] MRN: [id number] Date of Birth: 1961-01-22  [facility]) CM/SW Contact:    Bethann Berkshire, LCSW Phone Number: 11/20/2022, 2:02 PM  Clinical Narrative:                  CSW called pt on room phone. She confirmed she is at [facility] for the last 8 months for LTC. She confirms plan to return at DC. CSW updated SNF on possible DC tomorrow. TOC will follow to assist with DC to SNF. Fl2 sent to SNF in hub.   Expected Discharge Plan: Skilled Nursing Facility Barriers to Discharge: Continued Medical Work up   Patient Goals and CMS Choice        Expected Discharge Plan and Services Expected Discharge Plan: [place]                                              Prior Living Arrangements/Services                       Activities of Daily Living      Permission Sought/Granted                  Emotional Assessment       Orientation: : Oriented to Self, Oriented to Place, Oriented to  Time, Oriented to Situation Alcohol / Substance Use: Not Applicable Psych Involvement: No (comment)  Admission diagnosis:  CHF (congestive heart failure) (HCC) [I50.9] Acute pulmonary edema (HCC) [J81.0] End stage renal disease on dialysis ([place]) [N18.6, Z99.2] Acute respiratory failure, unspecified whether with hypoxia or hypercapnia ([place]) [J96.00] Patient Active Problem List   Diagnosis Date Noted   Acute respiratory failure with hypoxia ([place]) 11/04/2022   Pain, unspecified 87/86/7672   Complication of vascular dialysis catheter 08/07/2022   Chest pain 05/30/2022   Fluid overload 05/30/2022   Acute pulmonary edema ([place]) 05/30/2022   Iron deficiency anemia, unspecified 04/26/2022   Moderate protein-calorie malnutrition ([place]) 04/18/2022   Nausea 04/10/2022   Hyponatremia 04/04/2022   Prolonged QT interval 04/04/2022   Anaphylactic shock,  unspecified, initial encounter 02/23/2022   Coagulation defect, unspecified ([place]) 02/23/2022   Other allergy, initial encounter 02/23/2022   Pressure injury of skin 02/19/2022   Anemia in chronic kidney disease 02/16/2022   Dependence on renal dialysis ([place]) 02/16/2022   Hyperlipidemia, unspecified 02/16/2022   Hypertensive heart and chronic kidney disease with heart failure and with stage 5 chronic kidney disease, or end stage renal disease ([place]) 02/16/2022   Personal history of nicotine dependence 02/16/2022   Personal history of transient ischemic attack (TIA), and cerebral infarction without residual deficits 02/16/2022   Secondary hyperparathyroidism of renal origin ([place]) 02/16/2022   Type 2 diabetes mellitus with diabetic peripheral angiopathy without gangrene ([place]) 02/16/2022   Renal anasarca 02/14/2022   ESRD on dialysis ([place]) 02/14/2022   Hyperkalemia 02/14/2022   S/P arteriovenous (AV) fistula creation 12/22/2021   Critical limb ischemia of both lower extremities ([place]) 11/22/2021   Subacute osteomyelitis, right ankle and foot ([place])    Peripheral artery disease ([place]) 10/27/2021   Acute exacerbation of congestive heart failure ([place]) 10/24/2021   Acute cardiogenic pulmonary edema ([place]) 10/06/2021   CAD (coronary artery disease) 10/06/2021   Goals of care, counseling/discussion  Gangrene of left foot (HCC)    Cellulitis of left foot    Diabetic foot ulcers ([place]) 09/04/2021   CHF (congestive heart failure) ([place]) 07/07/2021   Acute postoperative anemia due to expected blood loss superimposed on anemia of chronic renal insufficiency 06/30/2021   CKD (chronic kidney disease), stage IV (HCC) 06/11/2021   Chronic diastolic CHF (congestive heart failure) ([place]) 06/10/2021   Abnormal nuclear stress test    Dyspnea on exertion 03/07/2021   Orthopnea 02/25/2021   History of cerebrovascular accident (CVA) with residual deficit 05/06/2020   Diabetic peripheral neuropathy ([place]) 04/26/2020    AKI (acute kidney injury) ([place]) 04/20/2020   Generalized weakness 04/20/2020   Dehydration 04/20/2020   Generalized abdominal pain    Pancreatitis, acute 02/29/2020   Cerebrovascular accident (CVA) ([place]) 11/08/2019   Stenosis of right carotid artery 11/08/2019   Mixed diabetic hyperlipidemia associated with type 2 diabetes mellitus ([place]) 11/08/2019   Cortical age-related cataract of both eyes 08/30/2019   Gait abnormality 08/30/2019   Statin declined 08/30/2019   Gastroesophageal reflux disease without esophagitis 04/18/2018   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   Insulin dependent type 2 diabetes mellitus ([place]) 10/29/2017   History of macular degeneration 10/29/2017   Chronic cervical pain 10/29/2016   Myalgia 09/14/2016   Insomnia 09/14/2016   Tinea pedis of both feet 08/20/2016   Macular degeneration 08/17/2016   Low back pain 09/21/2014   Hypertensive urgency 09/21/2014   Diabetic gastroparesis associated with type 2 diabetes mellitus ([place]) 09/14/2014   HTN (hypertension) 09/08/2014   Thyroid nodule 08/17/2014   Morbid obesity ([place]) 08/17/2014   Hypokalemia 08/06/2014   Type II diabetes mellitus with neurological manifestations, uncontrolled 08/04/2014   PCP:  [name], MD Pharmacy:  No Pharmacies Listed    Social Determinants of Health (SDOH) Interventions    Readmission Risk Interventions    02/16/2022   11:55 AM 09/21/2021    9:05 AM 03/07/2020    2:36 PM  Readmission Risk Prevention Plan  Transportation Screening Complete Complete Complete  PCP or Specialist Appt within 3-5 Days   Complete  Social Work Consult for [place] Planning/Counseling   Complete  Palliative Care Screening   Not Applicable  Medication Review Press photographer) Complete Complete Complete  PCP or Specialist appointment within 3-5 days of discharge  Complete   HRI or [place]  Complete   SW Recovery Care/Counseling Consult  Complete   Palliative Care Screening  Complete    [place]  Not Applicable

## 2022-11-20 NOTE — Progress Notes (Signed)
Patient on 3L [place] resting comfortably. BIPAP at bedside on standby if needed. RT will monitor as needed.

## 2022-11-20 NOTE — Progress Notes (Signed)
PROGRESS NOTE    [name]  CNO:[id number] DOB: 01-21-1961 DOA: 11/18/2022 PCP: [name], MD    Brief Narrative:  Nancy [name] is a 61 y.o. female with medical history significant of ESRD on hemodialysis Monday Wednesday Friday, hypertension, chronic heart failure with preserved ejection fraction, hyperlipidemia, coronary artery disease, type 2 diabetes,  PVD status post bilateral BKA, presented to the hospital with shortness of breath.  Patient had normal schedule dialysis on Friday but then he started having acute shortness of breath orthopnea.  EMS noted that patient was hypoxic in the 30s and was put on nonrebreather mask.  Chest x-ray in the ED showed pulmonary edema.  Patient was put on BiPAP and nephrology was consulted for emergent hemodialysis.  Initial blood pressure was 180.  Assessment and plan  Acute on chronic HFpEF decompensation Patient had pulmonary edema, hypertensive emergency.  Nephrology was consulted for emergency dialysis.  Lasix was also given.  COVID and influenza was negative.  ABG showed pH of 7.3 with pCO2 of 58.  Patient underwent repeat hemodialysis on 11/19/2022. On 3 L of oxygen by nasal cannula.  States that she uses 4 days of oxygen by nasal cannula at baseline.  HTN emergency Patient is on amlodipine, labetalol,of Imdur.  Unfortunately she is allergic to hydralazine.  Dose of Imdur was increased 120 mg this admission.  Blood pressure still elevated but slightly better.   Diabetes mellitus type 2 with hyperglycemia. Continue sliding scale insulin while in the hospital diabetic diet.  Closely monitor blood glucose levels.  ESRD on HD Continue hemodialysis for volume management.Marland Kitchen   Hx of CVA -Continue aspirin Zetia atorvastatin   PVD and B/L BKA Continue aspirin and Zetia Lipitor.  Working on prosthesis.  Currently at the skilled nursing facility.  New episode of fever.  Temperature max 100.7 F.  Had shortness of breath but  declined a chest x-ray.  If there is further spike of fever will need blood cultures.  Will monitor for 24 hours.  Observe off antibiotic.  Possibility of viral infection but COVID and influenza was negative.  CBC in AM.     DVT prophylaxis: heparin injection 5,000 Units Start: 11/18/22 1500   Code Status:     Code Status: Full Code  Disposition: Skilled nursing facility likely on 11/21/2022 if remains stable and afebrile..  Patient is from [facility].  TOC aware.  Status is: Inpatient  Remains inpatient appropriate because: Hemodialysis for volume management, accelerated hypertension new fever   Family Communication: None at bedside.  Consultants:  Nephrology  Procedures:  Hemodialysis  Antimicrobials:  None  Anti-infectives (From admission, onward)    None       Subjective: Today, patient was seen and examined at bedside.  At the fever and complains of mild chills.  Denies chest pain sputum production but has mild shortness of breath.  On supplemental oxygen.    Objective: Vitals:   11/20/22 0311 11/20/22 0356 11/20/22 0838 11/20/22 0853  BP:  (!) 189/84  (!) 185/97  Pulse:  77 86 87  Resp: '18 18 20 18  '$ Temp:  98.4 F (36.9 C)  99 F (37.2 C)  TempSrc:  Oral  Oral  SpO2:  98% 98% 100%  Weight:  90.2 kg    Height:        Intake/Output Summary (Last 24 hours) at 11/20/2022 1418 Last data filed at 11/20/2022 0300 Gross per 24 hour  Intake 380 ml  Output 2200 ml  Net -1820  ml    Filed Weights   11/19/22 1446 11/19/22 1804 11/20/22 0356  Weight: 90.6 kg 87.7 kg 90.2 kg    Physical Examination: Body mass index is 35.23 kg/m.   General: Obese built, not in obvious distress, on nasal cannula oxygen 3 L/min. HENT:   No scleral pallor or icterus noted. Oral mucosa is moist.  Chest:    Diminished breath sounds bilaterally.  Coarse breath sounds noted. CVS: S1 &S2 heard. No murmur.  Regular rate and rhythm. Abdomen: Soft, nontender, nondistended.  Bowel  sounds are heard.   Extremities: Bilateral lower extremity below-knee amputation. Psych: Alert, awake and oriented, normal mood CNS:  No cranial nerve deficits.  Moves upper extremities. Skin: Warm and dry.  No rashes noted.  Data Reviewed:   CBC: Recent Labs  Lab 11/18/22 1116 11/18/22 1250 11/19/22 1550  WBC 10.5  --  9.8  NEUTROABS 8.0*  --   --   HGB 10.7* 11.9* 9.9*  HCT 36.9 35.0* 34.2*  MCV 92.9  --  93.7  PLT 225  --  232     Basic Metabolic Panel: Recent Labs  Lab 11/18/22 1116 11/18/22 1250 11/18/22 1406 11/19/22 0645 11/19/22 2057 11/20/22 0139  NA 138 139  --  139 137 137  K 4.6 4.3  --  3.8 3.7 3.8  CL 99  --   --  97* 99 98  CO2 28  --   --  32 29 27  GLUCOSE 195*  --   --  122* 153* 137*  BUN 22  --   --  '13 17 21  '$ CREATININE 4.69*  --   --  3.33* 4.04* 4.31*  CALCIUM 8.8*  --   --  9.3 9.0 9.1  PHOS  --   --  3.9  --  3.3  --      Liver Function Tests: Recent Labs  Lab 11/18/22 1116 11/19/22 2057  AST 23  --   ALT 9  --   ALKPHOS 89  --   BILITOT 1.1  --   PROT 7.0  --   ALBUMIN 3.5 3.5      Radiology Studies: No results found.    LOS: 1 day    Flora Lipps, MD [facility] Available via Epic secure chat 7am-7pm After these hours, please refer to coverage provider listed on [email or web address] 11/20/2022, 2:18 PM

## 2022-11-21 ENCOUNTER — Inpatient Hospital Stay (HOSPITAL_COMMUNITY): Payer: Medicare Other

## 2022-11-21 DIAGNOSIS — I5033 Acute on chronic diastolic (congestive) heart failure: Secondary | ICD-10-CM | POA: Diagnosis not present

## 2022-11-21 LAB — BASIC METABOLIC PANEL
Anion gap: 10 (ref 5–15)
BUN: 33 mg/dL — ABNORMAL HIGH (ref 8–23)
CO2: 27 mmol/L (ref 22–32)
Calcium: 8.7 mg/dL — ABNORMAL LOW (ref 8.9–10.3)
Chloride: 97 mmol/L — ABNORMAL LOW (ref 98–111)
Creatinine, Ser: 5.57 mg/dL — ABNORMAL HIGH (ref 0.44–1.00)
GFR, Estimated: 8 mL/min — ABNORMAL LOW (ref >60)
Glucose, Bld: 150 mg/dL — ABNORMAL HIGH (ref 70–99)
Potassium: 3.8 mmol/L (ref 3.5–5.1)
Sodium: 134 mmol/L — ABNORMAL LOW (ref 135–145)

## 2022-11-21 LAB — MAGNESIUM: Magnesium: 2.3 mg/dL (ref 1.7–2.4)

## 2022-11-21 LAB — GLUCOSE, CAPILLARY
Glucose-Capillary: 117 mg/dL — ABNORMAL HIGH (ref 70–99)
Glucose-Capillary: 94 mg/dL (ref 70–99)
Glucose-Capillary: 170 mg/dL — ABNORMAL HIGH (ref 70–99)

## 2022-11-21 LAB — CBC
HCT: 35.3 % — ABNORMAL LOW (ref 36.0–46.0)
Hemoglobin: 10.3 g/dL — ABNORMAL LOW (ref 12.0–15.0)
MCH: 26.5 pg (ref 26.0–34.0)
MCHC: 29.2 g/dL — ABNORMAL LOW (ref 30.0–36.0)
MCV: 91 fL (ref 80.0–100.0)
Platelets: 242 10*3/uL (ref 150–400)
RBC: 3.88 MIL/uL (ref 3.87–5.11)
RDW: 19.5 % — ABNORMAL HIGH (ref 11.5–15.5)
WBC: 9.6 10*3/uL (ref 4.0–10.5)
nRBC: 0 % (ref 0.0–0.2)

## 2022-11-21 MED ORDER — ANTICOAGULANT SODIUM CITRATE 4% (200MG/5ML) IV SOLN
5.0000 mL | Status: DC | PRN
  Filled 2022-11-21: qty 5

## 2022-11-21 MED ORDER — SODIUM CHLORIDE 0.9 % IV SOLN: Freq: Once | INTRAVENOUS | Status: DC

## 2022-11-21 MED ORDER — ALTEPLASE 2 MG IJ SOLR: 2.0000 mg | Freq: Once | INTRAMUSCULAR | Status: DC | PRN

## 2022-11-21 MED ORDER — LACTULOSE 10 GM/15ML PO SOLN
20.0000 g | Freq: Two times a day (BID) | ORAL | Status: AC
  Administered 2022-11-21 (×2): 20 g via ORAL
  Filled 2022-11-21 (×2): qty 30

## 2022-11-21 MED ORDER — HYDROMORPHONE HCL 2 MG PO TABS
1.0000 mg | ORAL_TABLET | ORAL | Status: DC | PRN
  Administered 2022-11-21: 1 mg via ORAL
  Filled 2022-11-21: qty 1

## 2022-11-21 MED ORDER — BISACODYL 10 MG RE SUPP
10.0000 mg | Freq: Once | RECTAL | Status: AC
  Administered 2022-11-21: 10 mg via RECTAL
  Filled 2022-11-21: qty 1

## 2022-11-21 MED ORDER — HEPARIN SODIUM (PORCINE) 1000 UNIT/ML DIALYSIS
1000.0000 [IU] | INTRAMUSCULAR | Status: DC | PRN
  Administered 2022-11-21: 1000 [IU]
  Filled 2022-11-21 (×3): qty 1

## 2022-11-21 MED ORDER — LORAZEPAM 2 MG/ML IJ SOLN: 0.5000 mg | Freq: Once | INTRAMUSCULAR | Status: DC

## 2022-11-21 NOTE — Progress Notes (Signed)
Pt placed on BIPAP for night rest tolerating well. 

## 2022-11-21 NOTE — Progress Notes (Signed)
PROGRESS NOTE    Nancy [name]  RWE:[id number] DOB: [birth year] DOA: 11/18/2022 PCP: [name], MD    Brief Narrative:  Nancy [name] is a 61 y.o. female with medical history significant of ESRD on hemodialysis Monday Wednesday Friday, hypertension, chronic diastolic CHF, chronic hypoxic respiratory failure on 3 to 4 L O2, hyperlipidemia, coronary artery disease, type 2 diabetes,  PVD status post bilateral BKA, resident of SNF presented to the hospital with shortness of breath.  Patient had normal schedule dialysis on Friday but then he started having acute shortness of breath orthopnea.  EMS noted that patient was hypoxic in the 30s and was put on nonrebreather mask.  Chest x-ray in the ED showed pulmonary edema.  Patient was put on BiPAP and nephrology was consulted for emergent hemodialysis.  Initial blood pressure was 180.  Subjective: -Patient seen on dialysis this morning, reports constipation, last BM about 2 weeks ago, mild abdominal discomfort, no nausea or vomiting, no fevers or chills  Assessment and plan  Acute on chronic diastolic CHF Acute on chronic hypoxic respiratory failure ESRD -Admitted with pulmonary edema, hypertensive emergency -Improving with HD -O2 weaned down to 2 L now  Severe constipation -Mild abdominal discomfort, no BM in 2 weeks, no nausea or vomiting, exam not consistent with ileus or SBO, add lactulose today and monitor  HTN emergency -Improving with HD, continue amlodipine, labetalol, Imdur   Diabetes mellitus type 2 with hyperglycemia. Continue sliding scale insulin while in the hospital diabetic diet.  Closely monitor blood glucose levels.  ESRD on HD Continue hemodialysis for volume management.Marland Kitchen   Hx of CVA -Continue aspirin Zetia atorvastatin   PVD and B/L BKA Continue aspirin and Zetia Lipitor.  Working on prosthesis.  Currently at the skilled nursing facility.  New episode of fever.  Temperature max 100.7 F on 12/4.    -Afebrile since then, monitor     DVT prophylaxis: heparin injection 5,000 Units Start: 11/18/22 1500   Code Status:     Code Status: Full Code  Disposition: Skilled nursing facility tomorrow  Status is: Inpatient  Remains inpatient appropriate because: Hemodialysis for volume management, accelerated hypertension new fever   Family Communication: None at bedside.  Consultants:  Nephrology  Procedures:  Hemodialysis  Antimicrobials:  None  Anti-infectives (From admission, onward)    None       Objective: Vitals:   11/21/22 0830 11/21/22 0900 11/21/22 0930 11/21/22 1000  BP: (!) 105/93 119/77 103/69 127/81  Pulse: 73 72 73 71  Resp: 16 (!) '24 14 13  '$ Temp:      TempSrc:      SpO2: 100% 100% 97% 100%  Weight:      Height:        Intake/Output Summary (Last 24 hours) at 11/21/2022 1023 Last data filed at 11/21/2022 0300 Gross per 24 hour  Intake 240 ml  Output 100 ml  Net 140 ml   Filed Weights   11/20/22 0356 11/21/22 0440 11/21/22 0708  Weight: 90.2 kg 91.2 kg 89.2 kg    Physical Examination: Body mass index is 34.84 kg/m.   General: Obese built, not in obvious distress, on nasal cannula oxygen 3 L/min. HENT:   No scleral pallor or icterus noted. Oral mucosa is moist.  Chest:    Diminished breath sounds bilaterally.  Coarse breath sounds noted. CVS: S1 &S2 heard. No murmur.  Regular rate and rhythm. Abdomen: Soft, nontender, nondistended.  Bowel sounds are heard.   Extremities: Bilateral lower extremity  below-knee amputation. Psych: Alert, awake and oriented, normal mood CNS:  No cranial nerve deficits.  Moves upper extremities. Skin: Warm and dry.  No rashes noted.  Data Reviewed:   CBC: Recent Labs  Lab 11/18/22 1116 11/18/22 1250 11/19/22 1550 11/21/22 0103  WBC 10.5  --  9.8 9.6  NEUTROABS 8.0*  --   --   --   HGB 10.7* 11.9* 9.9* 10.3*  HCT 36.9 35.0* 34.2* 35.3*  MCV 92.9  --  93.7 91.0  PLT 225  --  232 242    Basic  Metabolic Panel: Recent Labs  Lab 11/18/22 1116 11/18/22 1250 11/18/22 1406 11/19/22 0645 11/19/22 2057 11/20/22 0139 11/21/22 0103  NA 138 139  --  139 137 137 134*  K 4.6 4.3  --  3.8 3.7 3.8 3.8  CL 99  --   --  97* 99 98 97*  CO2 28  --   --  32 '29 27 27  '$ GLUCOSE 195*  --   --  122* 153* 137* 150*  BUN 22  --   --  '13 17 21 '$ 33*  CREATININE 4.69*  --   --  3.33* 4.04* 4.31* 5.57*  CALCIUM 8.8*  --   --  9.3 9.0 9.1 8.7*  MG  --   --   --   --   --   --  2.3  PHOS  --   --  3.9  --  3.3  --   --     Liver Function Tests: Recent Labs  Lab 11/18/22 1116 11/19/22 2057  AST 23  --   ALT 9  --   ALKPHOS 89  --   BILITOT 1.1  --   PROT 7.0  --   ALBUMIN 3.5 3.5     Radiology Studies: No results found.    LOS: 2 days    Domenic Polite, MD [facility] 11/21/2022, 10:23 AM

## 2022-11-21 NOTE — Progress Notes (Addendum)
[facility] Progress Note   Subjective:   Seen at dialysis - prior to treatment saying more dyspnea --> goal 4L and midway through tx she is feeling better.  Appears constipated too.   Objective Vitals:   11/21/22 1100 11/21/22 1104 11/21/22 1108 11/21/22 1110  BP: (!) 58/20 (!) 84/63 112/77 (!) 143/94  Pulse: 67 72 73 71  Resp: (!) 23 (!) '23 17 20  '$ Temp:    98.1 F (36.7 C)  TempSrc:    Oral  SpO2: 96%  100% 100%  Weight:    85.8 kg  Height:       Physical Exam General: comfortable in bed on 4L  Heart: RRR Lungs: dec BS bases, normal WOB at rest Abdomen: soft Extremities: trace edema Dialysis Access:  RUE AVF +t/b - on smaller side, TDC c/d/I - using TDC for HD  Additional Objective Labs: Basic Metabolic Panel: Recent Labs  Lab 11/18/22 1406 11/19/22 0645 11/19/22 2057 11/20/22 0139 11/21/22 0103  NA  --    < > 137 137 134*  K  --    < > 3.7 3.8 3.8  CL  --    < > 99 98 97*  CO2  --    < > '29 27 27  '$ GLUCOSE  --    < > 153* 137* 150*  BUN  --    < > 17 21 33*  CREATININE  --    < > 4.04* 4.31* 5.57*  CALCIUM  --    < > 9.0 9.1 8.7*  PHOS 3.9  --  3.3  --   --    < > = values in this interval not displayed.    Liver Function Tests: Recent Labs  Lab 11/18/22 1116 11/19/22 2057  AST 23  --   ALT 9  --   ALKPHOS 89  --   BILITOT 1.1  --   PROT 7.0  --   ALBUMIN 3.5 3.5    No results for input(s): "LIPASE", "AMYLASE" in the last 168 hours. CBC: Recent Labs  Lab 11/18/22 1116 11/18/22 1250 11/19/22 1550 11/21/22 0103  WBC 10.5  --  9.8 9.6  NEUTROABS 8.0*  --   --   --   HGB 10.7* 11.9* 9.9* 10.3*  HCT 36.9 35.0* 34.2* 35.3*  MCV 92.9  --  93.7 91.0  PLT 225  --  232 242    Blood Culture    Component Value Date/Time   SDES BLOOD LEFT ANTECUBITAL 03/29/2022 1839   SPECREQUEST  03/29/2022 1839    BOTTLES DRAWN AEROBIC ONLY Blood Culture adequate volume   CULT  03/29/2022 1839    NO GROWTH 5 DAYS Performed at [facility], [place] [address] [place], [place] [address]    REPTSTATUS 04/03/2022 FINAL 03/29/2022 1839    Cardiac Enzymes: No results for input(s): "CKTOTAL", "CKMB", "CKMBINDEX", "TROPONINI" in the last 168 hours. CBG: Recent Labs  Lab 11/20/22 0616 11/20/22 1123 11/20/22 1612 11/20/22 2101 11/21/22 0629  GLUCAP 136* 118* 150* 200* 117*    Iron Studies: No results for input(s): "IRON", "TIBC", "TRANSFERRIN", "FERRITIN" in the last 72 hours. '@lablastinr3'$ @ Studies/Results: No results found. Medications:  sodium chloride     anticoagulant sodium citrate      amLODipine  10 mg Oral Daily   aspirin EC  81 mg Oral Daily   atorvastatin  40 mg Oral Daily   bisacodyl  10 mg Rectal Once   calcitRIOL  0.25 mcg  Oral Q M,W,F-HD   Chlorhexidine Gluconate Cloth  6 each Topical Q0600   ezetimibe  10 mg Oral Daily   ferrous sulfate  325 mg Oral Q breakfast   heparin  5,000 Units Subcutaneous Q12H   insulin aspart  0-6 Units Subcutaneous TID WC   insulin glargine-yfgn  10 Units Subcutaneous Daily   isosorbide mononitrate  120 mg Oral Daily   labetalol  300 mg Oral BID   lactulose  20 g Oral BID   pantoprazole  40 mg Oral Daily   polyethylene glycol  17 g Oral Daily   sevelamer carbonate  800 mg Oral TID WC   sodium chloride flush  3 mL Intravenous Q12H    OP HD: [facility] MWF 4h 57mn  400/1.5  97.9kg  2/2 bath TDC RIJ  Hep 5000+ 300105mrun - last HD 12/01, post wt 94.2kg - venofer '100mg'$  tiw thru 12/06 - calcitriol 0.25 ug po tiw - mircera 150 q2, last 11/29, due in 1.5 wks     Assessment/ Plan: SOB / resp distress / pulm edema /vol overload -  HD completed Sun and extra Mon - per I/Os net neg 4.4L this admission.  On O2 qhs typically.  Maximize UF with HD again today - if still with dyspnea after treatment will plan to repeat CXR.  ADDENDUM: still report of dyspnea at end of HD today per RN - 2 view CXR (port) read as resolved pulm edema.  ESRD - on HD MWF. Had HD Sun and Mon mainly for  volume - will need new EDW. Wts here significantly below EDW.  97.9kg vs 85.8 post HD today.  [facility]F that has been accessed with 17g needle x1 at HD unit.  Ok to continue new AVF protocol at d/c/ HTN/ vol - remains HTN despite volume unloading - cont to maximize UF ^ imdur.  PAD - sp bilat BKA. 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 Cont renvela 1 TIDAC as binder, vdra.  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 Esa just given last week in OP unit.  Plans for SNF at discharge - she came from SNF.    Nancy [name] 11/21/2022, 11:36 AM  [facility] Pager: [phone]

## 2022-11-21 NOTE — Progress Notes (Signed)
   11/21/22 1110  Vitals  Temp 98.1 F (36.7 C)  Temp Source Oral  BP (!) 143/94  MAP (mmHg) 108  BP Location Left Wrist  BP Method Automatic  Patient Position (if appropriate) Lying  Pulse Rate 71  Pulse Rate Source Monitor  ECG Heart Rate 73  Resp 20  Oxygen Therapy  SpO2 100 %  O2 Device Nasal Cannula  O2 Flow Rate (L/min) 4 L/min  Pulse Oximetry Type Continuous   Received patient in bed to unit.  Alert and oriented.  Informed consent signed and in chart.   Treatment initiated: 0720 Treatment completed: 1100  Patient tolerated well.  Transported back to the room  Alert, without acute distress.  Hand-off given to patient's nurse.   Access used: HD Cath Access issues: Venous would not pull back  Total UF removed: 4041m Medication(s) given: Ferric Gluconate '125mg'$  IVBP, Heparin Dwells Post HD VS: see above Post HD weight: 85.8kg   [facility]

## 2022-11-21 NOTE — Progress Notes (Addendum)
Notified for pt's ferric gluconate at 0644. At [place] sent msg bach that the med was out for delivery to floor and to call them to get it sent to me. I didn't see this msg until about [phone] and then called the primary nurse and asked her to send it to me. At [place] I still didn't have the med so I called back to remind her to send it and was told she didn't have it either and couldn't find it. I then called pharmacy back and spoke w/ Santiago Glad, pharmacist and explained the situation. Said she would re make it and send it to me stat  Update: primary nurse found med and tubed it to me.

## 2022-11-21 NOTE — Plan of Care (Signed)
Pt went to dialysis this morning. Pt received suppository and enema; pt had large BM. Chest xray performed due to pt complaining of chest discomfort.  Problem: Education: Goal: Ability to describe self-care measures that may prevent or decrease complications (Diabetes Survival Skills Education) will improve Outcome: Progressing Goal: Individualized Educational Video(s) Outcome: Progressing   Problem: Coping: Goal: Ability to adjust to condition or change in health will improve Outcome: Progressing   Problem: Fluid Volume: Goal: Ability to maintain a balanced intake and output will improve Outcome: Progressing   Problem: Health Behavior/Discharge Planning: Goal: Ability to identify and utilize available resources and services will improve Outcome: Progressing Goal: Ability to manage health-related needs will improve Outcome: Progressing   Problem: Metabolic: Goal: Ability to maintain appropriate glucose levels will improve Outcome: Progressing   Problem: Nutritional: Goal: Maintenance of adequate nutrition will improve Outcome: Progressing Goal: Progress toward achieving an optimal weight will improve Outcome: Progressing   Problem: Skin Integrity: Goal: Risk for impaired skin integrity will decrease Outcome: Progressing   Problem: Tissue Perfusion: Goal: Adequacy of tissue perfusion will improve Outcome: Progressing   Problem: Education: Goal: Knowledge of General Education information will improve Description: Including pain rating scale, medication(s)/side effects and non-pharmacologic comfort measures Outcome: Progressing   Problem: Health Behavior/Discharge Planning: Goal: Ability to manage health-related needs will improve Outcome: Progressing   Problem: Clinical Measurements: Goal: Ability to maintain clinical measurements within normal limits will improve Outcome: Progressing Goal: Will remain free from infection Outcome: Progressing Goal: Diagnostic test  results will improve Outcome: Progressing Goal: Respiratory complications will improve Outcome: Progressing Goal: Cardiovascular complication will be avoided Outcome: Progressing   Problem: Activity: Goal: Risk for activity intolerance will decrease Outcome: Progressing   Problem: Nutrition: Goal: Adequate nutrition will be maintained Outcome: Progressing   Problem: Coping: Goal: Level of anxiety will decrease Outcome: Progressing   Problem: Elimination: Goal: Will not experience complications related to bowel motility Outcome: Progressing Goal: Will not experience complications related to urinary retention Outcome: Progressing   Problem: Pain Managment: Goal: General experience of comfort will improve Outcome: Progressing   Problem: Safety: Goal: Ability to remain free from injury will improve Outcome: Progressing   Problem: Skin Integrity: Goal: Risk for impaired skin integrity will decrease Outcome: Progressing

## 2022-11-22 DIAGNOSIS — I5033 Acute on chronic diastolic (congestive) heart failure: Secondary | ICD-10-CM | POA: Diagnosis not present

## 2022-11-22 LAB — CBC
HCT: 37.4 % (ref 36.0–46.0)
Hemoglobin: 10.9 g/dL — ABNORMAL LOW (ref 12.0–15.0)
MCH: 27.3 pg (ref 26.0–34.0)
MCHC: 29.1 g/dL — ABNORMAL LOW (ref 30.0–36.0)
MCV: 93.5 fL (ref 80.0–100.0)
Platelets: 233 10*3/uL (ref 150–400)
RBC: 4 MIL/uL (ref 3.87–5.11)
RDW: 19.9 % — ABNORMAL HIGH (ref 11.5–15.5)
WBC: 11 10*3/uL — ABNORMAL HIGH (ref 4.0–10.5)
nRBC: 0 % (ref 0.0–0.2)

## 2022-11-22 LAB — GLUCOSE, CAPILLARY
Glucose-Capillary: 122 mg/dL — ABNORMAL HIGH (ref 70–99)
Glucose-Capillary: 146 mg/dL — ABNORMAL HIGH (ref 70–99)

## 2022-11-22 MED ORDER — ISOSORBIDE MONONITRATE ER 60 MG PO TB24: 60.0000 mg | ORAL_TABLET | Freq: Every day | ORAL | Status: DC

## 2022-11-22 MED ORDER — SENNOSIDES-DOCUSATE SODIUM 8.6-50 MG PO TABS: 1.0000 | ORAL_TABLET | Freq: Every day | ORAL | Status: DC

## 2022-11-22 MED ORDER — HYDROMORPHONE HCL 2 MG PO TABS: 1.0000 mg | ORAL_TABLET | Freq: Four times a day (QID) | ORAL | 0 refills | Status: DC | PRN

## 2022-11-22 NOTE — Progress Notes (Signed)
D/C order noted. Contacted [facility] to advise clinic that pt will d/c today and resume care tomorrow.   Melven Sartorius Renal Navigator [phone]

## 2022-11-22 NOTE — Discharge Summary (Signed)
Physician Discharge Summary  Nancy [name] MRN:[id number] DOB: 10/26/1961 DOA: 11/18/2022  PCP: Johnson, Deborah B, MD  Admit date: 11/18/2022 Discharge date: 11/22/2022  Time spent: 35 minutes  Recommendations for Outpatient Follow-up:  Narcotic dose decreased Continue hemodialysis Monday Wednesday Friday   Discharge Diagnoses:  Principal Problem: Pulmonary edema Acute on chronic diastolic CHF ESRD on hemodialysis Bilateral BKA Chronic pain Narcotic dependence   Hypertensive urgency   CKD (chronic kidney disease), stage IV (HCC) Type 2 diabetes mellitus with hyperglycemia   Discharge Condition: Improved  Diet recommendation: Renal, diabetic  Filed Weights   11/21/22 0708 11/21/22 1110 11/22/22 0400  Weight: 89.2 kg 85.8 kg 85.8 kg    History of present illness:  Nancy [name] is a 61 y.o. female with medical history significant of ESRD on hemodialysis Monday Wednesday Friday, hypertension, chronic diastolic CHF, chronic hypoxic respiratory failure on 3 to 4 L O2, hyperlipidemia, coronary artery disease, type 2 diabetes,  PVD status post bilateral BKA, resident of SNF presented to the hospital with shortness of breath.  Patient had normal schedule dialysis on Friday but then he started having acute shortness of breath orthopnea.  EMS noted that patient was hypoxic in the 30s and was put on nonrebreather mask.  Chest x-ray in the ED showed pulmonary edema.  Patient was put on BiPAP and nephrology was consulted for emergent hemodialysis.  Initial blood pressure was 180.   Hospital Course:   Acute on chronic diastolic CHF Acute on chronic hypoxic respiratory failure ESRD -Admitted with pulmonary edema, hypertensive emergency -Improving with HD -O2 weaned down to 2 L now, reportedly uses 3 to 4 L at baseline -Discharged back to SNF in a stable condition   Constipation -Mild abdominal discomfort, no BM in 2 weeks, no nausea or vomiting, exam not consistent with  ileus or SBO, had multiple BMs after enema yesterday, added Senokot to home meds, continue MiraLAX daily   HTN emergency -Improving with HD, continue amlodipine, labetalol, Imdur   Diabetes mellitus type 2 with hyperglycemia. -Continue glargine and NovoLog with meals   ESRD on HD -Continue HD Monday Wednesday Friday   Hx of CVA -Continue aspirin Zetia atorvastatin   PVD and B/L BKA Continue aspirin and Zetia Lipitor.  Working on prosthesis.  Resident of SNF   New episode of fever.  Temperature max 100.7 F on 12/4.   -Resolved, afebrile and stable since then, suspect viral URI, COVID PCR was negative      Procedures:   Consultations: Nephrology  Discharge Exam: Vitals:   11/22/22 0400 11/22/22 0736  BP: 120/66 127/74  Pulse:  82  Resp:  17  Temp: 98 F (36.7 C) 98.3 F (36.8 C)  SpO2:  99%   Expand All Collapse All   PROGRESS NOTE       Nancy [name]             MRN:[id number] DOB: 03/26/1961 DOA: 11/18/2022 PCP: Johnson, Deborah B, MD              Brief Narrative:  Nancy [name] is a 61 y.o. female with medical history significant of ESRD on hemodialysis Monday Wednesday Friday, hypertension, chronic diastolic CHF, chronic hypoxic respiratory failure on 3 to 4 L O2, hyperlipidemia, coronary artery disease, type 2 diabetes,  PVD status post bilateral BKA, resident of SNF presented to the hospital with shortness of breath.  Patient had normal schedule dialysis on Friday but then he started having acute shortness of breath orthopnea.  EMS   noted that patient was hypoxic in the 30s and was put on nonrebreather mask.  Chest x-ray in the ED showed pulmonary edema.  Patient was put on BiPAP and nephrology was consulted for emergent hemodialysis.  Initial blood pressure was 180.   Subjective: -Patient seen on dialysis this morning, reports constipation, last BM about 2 weeks ago, mild abdominal discomfort, no nausea or vomiting, no fevers or chills    Assessment and plan   Acute on chronic diastolic CHF Acute on chronic hypoxic respiratory failure ESRD -Admitted with pulmonary edema, hypertensive emergency -Improving with HD -O2 weaned down to 2 L now   Severe constipation -Mild abdominal discomfort, no BM in 2 weeks, no nausea or vomiting, exam not consistent with ileus or SBO, add lactulose today and monitor   HTN emergency -Improving with HD, continue amlodipine, labetalol, Imdur   Diabetes mellitus type 2 with hyperglycemia. Continue sliding scale insulin while in the hospital diabetic diet.  Closely monitor blood glucose levels.   ESRD on HD Continue hemodialysis for volume management..   Hx of CVA -Continue aspirin Zetia atorvastatin   PVD and B/L BKA Continue aspirin and Zetia Lipitor.  Working on prosthesis.  Currently at the skilled nursing facility.   New episode of fever.  Temperature max 100.7 F on 12/4.   -Afebrile since then, monitor      DVT prophylaxis: heparin injection 5,000 Units Start: 11/18/22 1500     Code Status:     Code Status: Full Code   Disposition: Skilled nursing facility tomorrow   Status is: Inpatient   Remains inpatient appropriate because: Hemodialysis for volume management, accelerated hypertension new fever    Family Communication: None at bedside.   Consultants:  Nephrology   Procedures:  Hemodialysis   Antimicrobials:  None   Anti-infectives (From admission, onward)        None             Objective:       Vitals:    11/21/22 0830 11/21/22 0900 11/21/22 0930 11/21/22 1000  BP: (!) 105/93 119/77 103/69 127/81  Pulse: 73 72 73 71  Resp: 16 (!) 24 14 13  Temp:          TempSrc:          SpO2: 100% 100% 97% 100%  Weight:          Height:              Intake/Output Summary (Last 24 hours) at 11/21/2022 1023 Last data filed at 11/21/2022 0300    Gross per 24 hour  Intake 240 ml  Output 100 ml  Net 140 ml         Filed Weights    11/20/22 0356  11/21/22 0440 11/21/22 0708  Weight: 90.2 kg 91.2 kg 89.2 kg      Physical Examination: Body mass index is 34.84 kg/m.    General: Obese built, no distress, on nasal cannula oxygen 2 L/min. Chest:    Diminished breath sounds bilaterally. Abdomen: Soft, nontender, nondistended.  Bowel sounds are heard.   Extremities: Bilateral lower extremity below-knee amputation. Skin: Warm and dry.  No rashes noted.    Discharge Instructions   Discharge Instructions     Discharge instructions   Complete by: As directed    Renal, Diabetic Diet   Increase activity slowly   Complete by: As directed       Allergies as of 11/22/2022       Reactions     Hydralazine Other (See Comments)   Hydralazine Hcl Other (See Comments)   Hair loss   Hydrocodone Itching, Other (See Comments)   Upset stomach   Metformin And Related Nausea And Vomiting, Other (See Comments)   Stomach pains, also   Other Nausea Only, Other (See Comments)   Lettuce- Does not digest this!!   Plaquenil [hydroxychloroquine Sulfate] Hives   Shellfish Allergy Nausea And Vomiting   Shellfish-derived Products Nausea Only, Other (See Comments)   Caused an upset stomach   Shrimp (diagnostic) Nausea Only, Other (See Comments)   Upset stomach    Sulfa Antibiotics Hives        Medication List     TAKE these medications    amLODipine 10 MG tablet Commonly known as: NORVASC Take 1 tablet (10 mg total) by mouth daily.   Aspirin Low Dose 81 MG tablet Generic drug: aspirin EC Take 1 tablet (81 mg total) by mouth daily.   atorvastatin 40 MG tablet Commonly known as: LIPITOR Take 1 tablet (40 mg total) by mouth daily.   Dexcom G6 Receiver Devi 1 Device by Does not apply route daily.   Dexcom G6 Sensor Misc 1 packet by Does not apply route daily.   Dexcom G6 Transmitter Misc 1 packet by Does not apply route daily.   ezetimibe 10 MG tablet Commonly known as: ZETIA Take 1 tablet (10 mg total) by mouth daily.    ferrous sulfate 325 (65 FE) MG tablet Take 1 tablet (325 mg total) by mouth daily with breakfast.   glucose blood test strip Use as instructedCheck blood sugar three times daily. E11.40   HYDROmorphone 2 MG tablet Commonly known as: DILAUDID Take 0.5 tablets (1 mg total) by mouth every 6 (six) hours as needed for severe pain (pain score 7-10). What changed:  how much to take when to take this   insulin glargine 100 UNIT/ML Solostar Pen Commonly known as: LANTUS Inject 10 Units into the skin at bedtime.   Insulin Pen Needle 32G X 4 MM Misc Use to inject insulin as directed.   isosorbide mononitrate 30 MG 24 hr tablet Commonly known as: IMDUR Take 1 tablet (30 mg total) by mouth daily. What changed: Another medication with the same name was added. Make sure you understand how and when to take each.   isosorbide mononitrate 60 MG 24 hr tablet Commonly known as: IMDUR Take 1 tablet (60 mg total) by mouth daily. Start taking on: November 23, 2022 What changed: You were already taking a medication with the same name, and this prescription was added. Make sure you understand how and when to take each.   labetalol 300 MG tablet Commonly known as: NORMODYNE Take 1 tablet (300 mg total) by mouth 2 (two) times daily.   multivitamin Tabs tablet Take 1 tablet by mouth at bedtime. What changed: when to take this   NovoLOG FlexPen 100 UNIT/ML FlexPen Generic drug: insulin aspart Inject 3 Units into the skin in the morning and at bedtime.   NyQuil HBP Cold & Flu 15-6.25-325 MG/15ML Liqd Generic drug: DM-Doxylamine-Acetaminophen Take 30 mLs by mouth every 8 (eight) hours as needed (Cold).   ondansetron 8 MG tablet Commonly known as: ZOFRAN Take 1 tablet (8 mg total) by mouth every 8 (eight) hours as needed for nausea   OneTouch Verio w/Device Kit Check blood sugar three times daily. E11.40   pantoprazole 40 MG tablet Commonly known as: PROTONIX Take 1 tablet (40 mg total) by  mouth daily.  polyethylene glycol 17 g packet Commonly known as: MIRALAX / GLYCOLAX Take 17 g by mouth daily.   promethazine 25 MG tablet Commonly known as: PHENERGAN Take 25 mg by mouth every 8 (eight) hours as needed for vomiting or nausea.   senna-docusate 8.6-50 MG tablet Commonly known as: Senokot-S Take 1 tablet by mouth at bedtime.   sevelamer 800 MG tablet Commonly known as: RENAGEL Take 1,600 mg by mouth 3 (three) times daily.   zinc sulfate 220 (50 Zn) MG capsule Take 1 capsule (220 mg total) by mouth daily.       Allergies  Allergen Reactions   Hydralazine Other (See Comments)   Hydralazine Hcl Other (See Comments)    Hair loss   Hydrocodone Itching and Other (See Comments)    Upset stomach   Metformin And Related Nausea And Vomiting and Other (See Comments)    Stomach pains, also   Other Nausea Only and Other (See Comments)    Lettuce- Does not digest this!!   Plaquenil [Hydroxychloroquine Sulfate] Hives   Shellfish Allergy Nausea And Vomiting   Shellfish-Derived Products Nausea Only and Other (See Comments)    Caused an upset stomach   Shrimp (Diagnostic) Nausea Only and Other (See Comments)    Upset stomach    Sulfa Antibiotics Hives      The results of significant diagnostics from this hospitalization (including imaging, microbiology, ancillary and laboratory) are listed below for reference.    Significant Diagnostic Studies: DG Chest Port 1 View  Result Date: 11/21/2022 CLINICAL DATA:  Hypoxia. EXAM: PORTABLE CHEST 1 VIEW COMPARISON:  11/18/2022 FINDINGS: There is a right IJ dialysis catheter with tips at the superior cavoatrial junction. Heart size and mediastinal contours appear normal. Interval clearing of bilateral interstitial and airspace opacities. No pleural effusion. IMPRESSION: Interval clearing of bilateral interstitial and airspace opacities compatible with resolved interstitial edema Electronically Signed   By: Taylor  Stroud M.D.   On:  11/21/2022 13:06   DG Chest Portable 1 View  Result Date: 11/18/2022 CLINICAL DATA:  Shortness of breath.  Patient is on CPAP. EXAM: PORTABLE CHEST 1 VIEW COMPARISON:  November 04, 2022 FINDINGS: The heart size and mediastinal contours are stable. Right central venous line is unchanged. Diffuse bilateral airspace disease identified in both lungs slightly worse in the left mid and lung base compared to prior exam. Probable small bilateral pleural effusions. The visualized skeletal structures are stable. IMPRESSION: Diffuse bilateral airspace disease identified in both lungs slightly worse in the left mid and lung base compared to prior exam, favor pulmonary edema. Superimposed pneumonia especially at the lung base is not excluded. Electronically Signed   By: Wei-Chen  Lin M.D.   On: 11/18/2022 11:45   DG Chest Port 1 View  Result Date: 11/04/2022 CLINICAL DATA:  Shortness of breath EXAM: PORTABLE CHEST 1 VIEW COMPARISON:  05/29/2022 FINDINGS: Right dialysis catheter remains in place, unchanged. Cardiomegaly. Diffuse bilateral airspace disease, worsening since prior study, likely pulmonary edema. Low lung volumes. No visible effusions or acute bony abnormality. IMPRESSION: Worsening diffuse bilateral airspace disease, likely edema. Electronically Signed   By: Kevin  Dover M.D.   On: 11/04/2022 01:22    Microbiology: Recent Results (from the past 240 hour(s))  Resp Panel by RT-PCR (Flu A&B, Covid) Anterior Nasal Swab     Status: None   Collection Time: 11/18/22 11:29 AM   Specimen: Anterior Nasal Swab  Result Value Ref Range Status   SARS Coronavirus 2 by RT PCR NEGATIVE NEGATIVE Final      Comment: (NOTE) SARS-CoV-2 target nucleic acids are NOT DETECTED.  The SARS-CoV-2 RNA is generally detectable in upper respiratory specimens during the acute phase of infection. The lowest concentration of SARS-CoV-2 viral copies this assay can detect is 138 copies/mL. A negative result does not preclude  SARS-Cov-2 infection and should not be used as the sole basis for treatment or other patient management decisions. A negative result may occur with  improper specimen collection/handling, submission of specimen other than nasopharyngeal swab, presence of viral mutation(s) within the areas targeted by this assay, and inadequate number of viral copies(<138 copies/mL). A negative result must be combined with clinical observations, patient history, and epidemiological information. The expected result is Negative.  Fact Sheet for Patients:  https://www.fda.gov/media/152166/download  Fact Sheet for Healthcare Providers:  https://www.fda.gov/media/152162/download  This test is no t yet approved or cleared by the [place] FDA and  has been authorized for detection and/or diagnosis of SARS-CoV-2 by FDA under an Emergency Use Authorization (EUA). This EUA will remain  in effect (meaning this test can be used) for the duration of the COVID-19 declaration under Section 564(b)(1) of the Act, 21 U.S.C.section 360bbb-3(b)(1), unless the authorization is terminated  or revoked sooner.       Influenza A by PCR NEGATIVE NEGATIVE Final   Influenza B by PCR NEGATIVE NEGATIVE Final    Comment: (NOTE) The Xpert Xpress SARS-CoV-2/FLU/RSV plus assay is intended as an aid in the diagnosis of influenza from Nasopharyngeal swab specimens and should not be used as a sole basis for treatment. Nasal washings and aspirates are unacceptable for Xpert Xpress SARS-CoV-2/FLU/RSV testing.  Fact Sheet for Patients: https://www.fda.gov/media/152166/download  Fact Sheet for Healthcare Providers: https://www.fda.gov/media/152162/download  This test is not yet approved or cleared by the [place] FDA and has been authorized for detection and/or diagnosis of SARS-CoV-2 by FDA under an Emergency Use Authorization (EUA). This EUA will remain in effect (meaning this test can be used) for the duration of  the COVID-19 declaration under Section 564(b)(1) of the Act, 21 U.S.C. section 360bbb-3(b)(1), unless the authorization is terminated or revoked.  Performed at [facility], [address] [place], [place] [address]      Labs: Basic Metabolic Panel: Recent Labs  Lab 11/18/22 1116 11/18/22 1250 11/18/22 1406 11/19/22 0645 11/19/22 2057 11/20/22 0139 11/21/22 0103  NA 138 139  --  139 137 137 134*  K 4.6 4.3  --  3.8 3.7 3.8 3.8  CL 99  --   --  97* 99 98 97*  CO2 28  --   --  32 29 27 27  GLUCOSE 195*  --   --  122* 153* 137* 150*  BUN 22  --   --  13 17 21 33*  CREATININE 4.69*  --   --  3.33* 4.04* 4.31* 5.57*  CALCIUM 8.8*  --   --  9.3 9.0 9.1 8.7*  MG  --   --   --   --   --   --  2.3  PHOS  --   --  3.9  --  3.3  --   --    Liver Function Tests: Recent Labs  Lab 11/18/22 1116 11/19/22 2057  AST 23  --   ALT 9  --   ALKPHOS 89  --   BILITOT 1.1  --   PROT 7.0  --   ALBUMIN 3.5 3.5   No results for input(s): "LIPASE", "AMYLASE" in the last 168 hours. No results   for input(s): "AMMONIA" in the last 168 hours. CBC: Recent Labs  Lab 11/18/22 1116 11/18/22 1250 11/19/22 1550 11/21/22 0103 11/22/22 0117  WBC 10.5  --  9.8 9.6 11.0*  NEUTROABS 8.0*  --   --   --   --   HGB 10.7* 11.9* 9.9* 10.3* 10.9*  HCT 36.9 35.0* 34.2* 35.3* 37.4  MCV 92.9  --  93.7 91.0 93.5  PLT 225  --  232 242 233   Cardiac Enzymes: No results for input(s): "CKTOTAL", "CKMB", "CKMBINDEX", "TROPONINI" in the last 168 hours. BNP: BNP (last 3 results) Recent Labs    02/14/22 1500 05/30/22 0358 11/04/22 0117  BNP 176.4* 654.3* 676.3*    ProBNP (last 3 results) No results for input(s): "PROBNP" in the last 8760 hours.  CBG: Recent Labs  Lab 11/20/22 2101 11/21/22 0629 11/21/22 1235 11/21/22 1717 11/22/22 0642  GLUCAP 200* 117* 94 170* 122*       Signed:    MD.  [facility] 11/22/2022, 8:59 AM    

## 2022-11-22 NOTE — Care Management Important Message (Signed)
Important Message  Patient Details  Name: BRANDYE INTHAVONG MRN: [id number] Date of Birth: [birth year]   Medicare Important Message Given:  Yes     Shelda Altes 11/22/2022, 12:49 PM

## 2022-11-22 NOTE — Progress Notes (Signed)
[facility] Progress Note   Subjective:   Seen in room. Feels back to baseline after 4L UF yesterday.  For Dc today.   Objective Vitals:   11/21/22 2317 11/22/22 0034 11/22/22 0400 11/22/22 0736  BP: (!) 105/57 105/66 120/66 127/74  Pulse: 76   82  Resp: '16 12  17  '$ Temp:   98 F (36.7 C) 98.3 F (36.8 C)  TempSrc:   Oral Oral  SpO2: 99%   99%  Weight:   85.8 kg   Height:       Physical Exam General: comfortable in bed on RA Heart: RRR Lungs: clear, normal WOB at rest Abdomen: soft Extremities: no  edema Dialysis Access:  RUE AVF +t/b - on smaller side, TDC c/d/I  Additional Objective Labs: Basic Metabolic Panel: Recent Labs  Lab 11/18/22 1406 11/19/22 0645 11/19/22 2057 11/20/22 0139 11/21/22 0103  NA  --    < > 137 137 134*  K  --    < > 3.7 3.8 3.8  CL  --    < > 99 98 97*  CO2  --    < > '29 27 27  '$ GLUCOSE  --    < > 153* 137* 150*  BUN  --    < > 17 21 33*  CREATININE  --    < > 4.04* 4.31* 5.57*  CALCIUM  --    < > 9.0 9.1 8.7*  PHOS 3.9  --  3.3  --   --    < > = values in this interval not displayed.    Liver Function Tests: Recent Labs  Lab 11/18/22 1116 11/19/22 2057  AST 23  --   ALT 9  --   ALKPHOS 89  --   BILITOT 1.1  --   PROT 7.0  --   ALBUMIN 3.5 3.5    No results for input(s): "LIPASE", "AMYLASE" in the last 168 hours. CBC: Recent Labs  Lab 11/18/22 1116 11/18/22 1250 11/19/22 1550 11/21/22 0103 11/22/22 0117  WBC 10.5  --  9.8 9.6 11.0*  NEUTROABS 8.0*  --   --   --   --   HGB 10.7*   < > 9.9* 10.3* 10.9*  HCT 36.9   < > 34.2* 35.3* 37.4  MCV 92.9  --  93.7 91.0 93.5  PLT 225  --  232 242 233   < > = values in this interval not displayed.    Blood Culture    Component Value Date/Time   SDES BLOOD LEFT ANTECUBITAL 03/29/2022 1839   SPECREQUEST  03/29/2022 1839    BOTTLES DRAWN AEROBIC ONLY Blood Culture adequate volume   CULT  03/29/2022 1839    NO GROWTH 5 DAYS Performed at [facility], [place] [address] [place], [place] [address]    REPTSTATUS 04/03/2022 FINAL 03/29/2022 1839    Cardiac Enzymes: No results for input(s): "CKTOTAL", "CKMB", "CKMBINDEX", "TROPONINI" in the last 168 hours. CBG: Recent Labs  Lab 11/20/22 2101 11/21/22 0629 11/21/22 1235 11/21/22 1717 11/22/22 0642  GLUCAP 200* 117* 94 170* 122*    Iron Studies: No results for input(s): "IRON", "TIBC", "TRANSFERRIN", "FERRITIN" in the last 72 hours. '@lablastinr3'$ @ Studies/Results: DG Chest Port 1 View  Result Date: 11/21/2022 CLINICAL DATA:  Hypoxia. EXAM: PORTABLE CHEST 1 VIEW COMPARISON:  11/18/2022 FINDINGS: There is a right IJ dialysis catheter with tips at the superior cavoatrial junction. Heart size and mediastinal contours appear normal. Interval clearing of bilateral  interstitial and airspace opacities. No pleural effusion. IMPRESSION: Interval clearing of bilateral interstitial and airspace opacities compatible with resolved interstitial edema Electronically Signed   By: Kerby Moors M.D.   On: 11/21/2022 13:06   Medications:  sodium chloride     sodium chloride      amLODipine  10 mg Oral Daily   aspirin EC  81 mg Oral Daily   atorvastatin  40 mg Oral Daily   calcitRIOL  0.25 mcg Oral Q M,W,F-HD   Chlorhexidine Gluconate Cloth  6 each Topical Q0600   ezetimibe  10 mg Oral Daily   ferrous sulfate  325 mg Oral Q breakfast   heparin  5,000 Units Subcutaneous Q12H   insulin aspart  0-6 Units Subcutaneous TID WC   insulin glargine-yfgn  10 Units Subcutaneous Daily   isosorbide mononitrate  120 mg Oral Daily   labetalol  300 mg Oral BID   LORazepam  0.5 mg Intravenous Once   pantoprazole  40 mg Oral Daily   polyethylene glycol  17 g Oral Daily   sevelamer carbonate  800 mg Oral TID WC   sodium chloride flush  3 mL Intravenous Q12H    OP HD: [facility] MWF 4h 11mn  400/1.5  97.9kg  2/2 bath TDC RIJ  Hep 5000+ 3009mrun - last HD 12/01, post wt 94.2kg - venofer '100mg'$  tiw thru 12/06 -  calcitriol 0.25 ug po tiw - mircera 150 q2, last 11/29, due in 1.5 wks     Assessment/ Plan: SOB / resp distress / pulm edema /vol overload -  HD completed Sun and extra Mon - per I/Os net neg 8L this admission.  On O2 qhs typically.  12/6 CXR improved and she's clinically much improved.  For d/c now.  EDW much lower ESRD - on HD MWF. Had HD Sun and Mon mainly for volume - will need new EDW. Wts here significantly below EDW.  97.9kg vs 85.8 post HD yest.  [facility] has AVF that has been accessed with 17g needle x1 at HD unit.  Ok to continue new AVF protocol at d/c/ HTN/ vol -BP now normal.  PAD - sp bilat BKA. 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 Cont renvela 1 TIDAC as binder, vdra.  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 Esa just given last week in OP unit.  Plans for SNF at discharge - she came from SNF.   D/c today it appears.    LiJannifer HickD 11/22/2022, 11:22 AM  [facility] Pager: [phone]

## 2022-11-22 NOTE — Progress Notes (Signed)
Attempted to call report to [facility] x2; placed on hold for 5 minutes each time.

## 2022-11-22 NOTE — TOC Transition Note (Signed)
Transition of Care [facility]) - CM/SW Discharge Note   Patient Details  Name: Nancy [name] MRN: [id number] Date of Birth: Jan 26, 1961  [facility]) CM/SW Contact:  Beckey Rutter, MSW, [place] Phone Number: [phone] 11/22/2022, 12:19 PM   Clinical Narrative:   CSW contacted pt husband to inform him of discharge. CSW called [facility] to schedule transportation.     Final next level of care: [place] Barriers to Discharge: No Barriers Identified   Patient Goals and CMS Choice        Discharge Placement              Patient chooses bed at:  [facility]) Patient to be transferred to facility by: [place] Name of family member notified: [name] (Husband) Patient and family notified of of transfer: 11/22/22  Discharge Plan and Services                                     Social Determinants of Health (SDOH) Interventions     Readmission Risk Interventions    02/16/2022   11:55 AM 09/21/2021    9:05 AM 03/07/2020    2:36 PM  Readmission Risk Prevention Plan  Transportation Screening Complete Complete Complete  PCP or Specialist Appt within 3-5 Days   Complete  Social Work Consult for [place] Planning/Counseling   Complete  Palliative Care Screening   Not Applicable  Medication Review [employer]) Complete Complete Complete  PCP or Specialist appointment within 3-5 days of discharge  Complete   HRI or [place]  Complete   SW Recovery Care/Counseling Consult  Complete   Palliative Care Screening  Complete   [place]  Not Applicable     Beckey Rutter, MSW, [place], [place] Transitions of Care  Clinical Social Worker I

## 2023-02-06 ENCOUNTER — Emergency Department (HOSPITAL_COMMUNITY)
Admission: EM | Admit: 2023-02-06 | Discharge: 2023-02-07 | Disposition: A | Discharge status: Home / Self Care | Payer: Medicare Other | Attending: Emergency Medicine | Admitting: Emergency Medicine

## 2023-02-06 ENCOUNTER — Encounter (HOSPITAL_COMMUNITY): Payer: Self-pay | Admitting: Emergency Medicine

## 2023-02-06 ENCOUNTER — Emergency Department (HOSPITAL_COMMUNITY): Payer: Medicare Other

## 2023-02-06 ENCOUNTER — Other Ambulatory Visit: Payer: Self-pay

## 2023-02-06 DIAGNOSIS — D649 Anemia, unspecified: Secondary | ICD-10-CM | POA: Insufficient documentation

## 2023-02-06 DIAGNOSIS — D72829 Elevated white blood cell count, unspecified: Secondary | ICD-10-CM | POA: Insufficient documentation

## 2023-02-06 DIAGNOSIS — E1143 Type 2 diabetes mellitus with diabetic autonomic (poly)neuropathy: Secondary | ICD-10-CM | POA: Diagnosis not present

## 2023-02-06 DIAGNOSIS — Z992 Dependence on renal dialysis: Secondary | ICD-10-CM | POA: Insufficient documentation

## 2023-02-06 DIAGNOSIS — R0602 Shortness of breath: Secondary | ICD-10-CM | POA: Insufficient documentation

## 2023-02-06 DIAGNOSIS — E785 Hyperlipidemia, unspecified: Secondary | ICD-10-CM | POA: Insufficient documentation

## 2023-02-06 DIAGNOSIS — I2510 Atherosclerotic heart disease of native coronary artery without angina pectoris: Secondary | ICD-10-CM | POA: Insufficient documentation

## 2023-02-06 DIAGNOSIS — E1169 Type 2 diabetes mellitus with other specified complication: Secondary | ICD-10-CM | POA: Insufficient documentation

## 2023-02-06 DIAGNOSIS — Z7982 Long term (current) use of aspirin: Secondary | ICD-10-CM | POA: Insufficient documentation

## 2023-02-06 DIAGNOSIS — Z79899 Other long term (current) drug therapy: Secondary | ICD-10-CM | POA: Diagnosis not present

## 2023-02-06 DIAGNOSIS — E1140 Type 2 diabetes mellitus with diabetic neuropathy, unspecified: Secondary | ICD-10-CM | POA: Diagnosis not present

## 2023-02-06 DIAGNOSIS — I5032 Chronic diastolic (congestive) heart failure: Secondary | ICD-10-CM | POA: Insufficient documentation

## 2023-02-06 DIAGNOSIS — E1122 Type 2 diabetes mellitus with diabetic chronic kidney disease: Secondary | ICD-10-CM | POA: Diagnosis not present

## 2023-02-06 DIAGNOSIS — K3184 Gastroparesis: Secondary | ICD-10-CM | POA: Diagnosis not present

## 2023-02-06 DIAGNOSIS — N186 End stage renal disease: Secondary | ICD-10-CM | POA: Insufficient documentation

## 2023-02-06 DIAGNOSIS — Z20822 Contact with and (suspected) exposure to covid-19: Secondary | ICD-10-CM | POA: Insufficient documentation

## 2023-02-06 DIAGNOSIS — E875 Hyperkalemia: Secondary | ICD-10-CM | POA: Insufficient documentation

## 2023-02-06 DIAGNOSIS — I132 Hypertensive heart and chronic kidney disease with heart failure and with stage 5 chronic kidney disease, or end stage renal disease: Secondary | ICD-10-CM | POA: Insufficient documentation

## 2023-02-06 DIAGNOSIS — Z794 Long term (current) use of insulin: Secondary | ICD-10-CM | POA: Diagnosis not present

## 2023-02-06 LAB — I-STAT VENOUS BLOOD GAS, ED
Acid-Base Excess: 4 mmol/L — ABNORMAL HIGH (ref 0.0–2.0)
Bicarbonate: 24.5 mmol/L (ref 20.0–28.0)
Calcium, Ion: 0.8 mmol/L — CL (ref 1.15–1.40)
HCT: 34 % — ABNORMAL LOW (ref 36.0–46.0)
Hemoglobin: 11.6 g/dL — ABNORMAL LOW (ref 12.0–15.0)
O2 Saturation: 100 %
Potassium: 5.7 mmol/L — ABNORMAL HIGH (ref 3.5–5.1)
Sodium: 132 mmol/L — ABNORMAL LOW (ref 135–145)
TCO2: 25 mmol/L (ref 22–32)
pCO2, Ven: 23.1 mmHg — ABNORMAL LOW (ref 44–60)
pH, Ven: 7.632 (ref 7.25–7.43)
pO2, Ven: 190 mmHg — ABNORMAL HIGH (ref 32–45)

## 2023-02-06 LAB — CBC
HCT: 32.4 % — ABNORMAL LOW (ref 36.0–46.0)
Hemoglobin: 9.9 g/dL — ABNORMAL LOW (ref 12.0–15.0)
MCH: 29.8 pg (ref 26.0–34.0)
MCHC: 30.6 g/dL (ref 30.0–36.0)
MCV: 97.6 fL (ref 80.0–100.0)
Platelets: 191 10*3/uL (ref 150–400)
RBC: 3.32 MIL/uL — ABNORMAL LOW (ref 3.87–5.11)
RDW: 17 % — ABNORMAL HIGH (ref 11.5–15.5)
WBC: 13.8 10*3/uL — ABNORMAL HIGH (ref 4.0–10.5)
nRBC: 0 % (ref 0.0–0.2)

## 2023-02-06 LAB — COMPREHENSIVE METABOLIC PANEL
ALT: 14 U/L (ref 0–44)
AST: 40 U/L (ref 15–41)
Albumin: 3.9 g/dL (ref 3.5–5.0)
Alkaline Phosphatase: 109 U/L (ref 38–126)
Anion gap: 14 (ref 5–15)
BUN: 32 mg/dL — ABNORMAL HIGH (ref 8–23)
CO2: 23 mmol/L (ref 22–32)
Calcium: 8.7 mg/dL — ABNORMAL LOW (ref 8.9–10.3)
Chloride: 98 mmol/L (ref 98–111)
Creatinine, Ser: 4.72 mg/dL — ABNORMAL HIGH (ref 0.44–1.00)
GFR, Estimated: 10 mL/min — ABNORMAL LOW (ref >60)
Glucose, Bld: 122 mg/dL — ABNORMAL HIGH (ref 70–99)
Potassium: 5.8 mmol/L — ABNORMAL HIGH (ref 3.5–5.1)
Sodium: 135 mmol/L (ref 135–145)
Total Bilirubin: 1.6 mg/dL — ABNORMAL HIGH (ref 0.3–1.2)
Total Protein: 7.9 g/dL (ref 6.5–8.1)

## 2023-02-06 LAB — RESP PANEL BY RT-PCR (RSV, FLU A&B, COVID)  RVPGX2
Influenza A by PCR: NEGATIVE
Influenza B by PCR: NEGATIVE
Resp Syncytial Virus by PCR: NEGATIVE
SARS Coronavirus 2 by RT PCR: NEGATIVE

## 2023-02-06 LAB — I-STAT CHEM 8, ED
BUN: 37 mg/dL — ABNORMAL HIGH (ref 8–23)
Calcium, Ion: 1.04 mmol/L — ABNORMAL LOW (ref 1.15–1.40)
Chloride: 101 mmol/L (ref 98–111)
Creatinine, Ser: 5.1 mg/dL — ABNORMAL HIGH (ref 0.44–1.00)
Glucose, Bld: 129 mg/dL — ABNORMAL HIGH (ref 70–99)
HCT: 36 % (ref 36.0–46.0)
Hemoglobin: 12.2 g/dL (ref 12.0–15.0)
Potassium: 4.8 mmol/L (ref 3.5–5.1)
Sodium: 138 mmol/L (ref 135–145)
TCO2: 29 mmol/L (ref 22–32)

## 2023-02-06 LAB — HEPATITIS B SURFACE ANTIGEN: Hepatitis B Surface Ag: NONREACTIVE

## 2023-02-06 MED ORDER — HYDROMORPHONE HCL 2 MG PO TABS
2.0000 mg | ORAL_TABLET | Freq: Once | ORAL | Status: AC
  Administered 2023-02-06: 2 mg via ORAL
  Filled 2023-02-06: qty 1

## 2023-02-06 MED ORDER — HEPARIN SODIUM (PORCINE) 1000 UNIT/ML DIALYSIS
5000.0000 [IU] | Freq: Once | INTRAMUSCULAR | Status: AC
  Administered 2023-02-06: 5000 [IU] via INTRAVENOUS_CENTRAL
  Filled 2023-02-06: qty 5

## 2023-02-06 MED ORDER — ONDANSETRON HCL 4 MG/2ML IJ SOLN
4.0000 mg | Freq: Once | INTRAMUSCULAR | Status: AC
  Administered 2023-02-06: 4 mg via INTRAVENOUS
  Filled 2023-02-06: qty 2

## 2023-02-06 MED ORDER — HEPARIN SODIUM (PORCINE) 1000 UNIT/ML DIALYSIS: 3000.0000 [IU] | INTRAMUSCULAR | Status: DC | PRN

## 2023-02-06 MED ORDER — DIPHENHYDRAMINE HCL 50 MG/ML IJ SOLN
25.0000 mg | Freq: Once | INTRAMUSCULAR | Status: AC
  Administered 2023-02-06: 50 mg via INTRAVENOUS
  Filled 2023-02-06: qty 1

## 2023-02-06 MED ORDER — CHLORHEXIDINE GLUCONATE CLOTH 2 % EX PADS: 6.0000 | MEDICATED_PAD | Freq: Every day | CUTANEOUS | Status: DC

## 2023-02-06 MED ORDER — NITROGLYCERIN 2 % TD OINT
1.0000 [in_us] | TOPICAL_OINTMENT | Freq: Once | TRANSDERMAL | Status: AC
  Administered 2023-02-06: 1 [in_us] via TOPICAL
  Filled 2023-02-06: qty 1

## 2023-02-06 MED ORDER — SODIUM POLYSTYRENE SULFONATE 15 GM/60ML PO SUSP
30.0000 g | Freq: Once | ORAL | Status: AC
  Administered 2023-02-06: 30 g via ORAL
  Filled 2023-02-06: qty 120

## 2023-02-06 NOTE — ED Provider Notes (Signed)
From Dr. Leonette Monarch.  62 year old female here with increased shortness of breath.  She is Monday Wednesday Friday dialysis due for dialysis today.  She has mildly elevated potassium given Lokelma.  Elevated blood pressure given Nitropaste.  Plan is for discussion with nephrology regarding dialysis today.  Likely can be discharged after dialysis arranged Physical Exam  BP (!) 188/88   Pulse 86   Temp (!) 97.5 F (36.4 C) (Oral)   Resp 17   SpO2 100%   Physical Exam  Procedures  Procedures  ED Course / MDM    Medical Decision Making Amount and/or Complexity of Data Reviewed Labs: ordered. Radiology: ordered.  Risk Prescription drug management.   9:45 AM.  Discussed with Dr. Burnett Sheng from nephrology who will arrange for patient to get dialysis.       Hayden Rasmussen, MD 02/06/23 [id number]

## 2023-02-06 NOTE — ED Notes (Signed)
PTAR to take pt back to [facility] today.

## 2023-02-06 NOTE — ED Triage Notes (Addendum)
PT BIB EMS from home with c/o SHOB x 1 day. Pt is a dialysis pt, last tx was Monday. Per EMS, snf staff states that she has been taking in more fluids than she is supposed to

## 2023-02-06 NOTE — Progress Notes (Signed)
Asked to see patient for dialysis. Pt here w/ SOB x 1 day. Last HD Monday. Pt is MWF. Drinking more fluids than usual per pt. CXR shows mild-mod pulm edema. Per ED pt may not need admission.  Will plan to dialyze patient this afternoon w/ max UF goal and then return to ED post HD for reassessment by ED team.   OP HD: [facility] MWF 4h 74mn  300/1.5   92kg  2/2 bath  RUE AVF  Hep 5000+ 30024mrun - rocaltrol 0.50 po tiw - venofer 50 q wk - mircera 100 mcg IV q 2 wks, last 2/07, due 2/21    RoKelly SplinterMD 02/06/2023, 9:47 AM  Recent Labs  Lab 02/06/23 0510 02/06/23 0510 02/06/23 0542 02/06/23 0600 02/06/23 0618  HGB  --    < > 11.6* 12.2 9.9*  ALBUMIN 3.9  --   --   --   --   CALCIUM 8.7*  --   --   --   --   CREATININE 4.72*  --   --  5.10*  --   K 5.8*  --  5.7* 4.8  --    < > = values in this interval not displayed.

## 2023-02-06 NOTE — ED Provider Notes (Signed)
  Physical Exam  BP 137/82 (BP Location: Left Arm)   Pulse 75   Temp 98.1 F (36.7 C) (Oral)   Resp 14   SpO2 100%     Procedures  Procedures  ED Course / MDM    Medical Decision Making Amount and/or Complexity of Data Reviewed Labs: ordered. Radiology: ordered.  Risk Prescription drug management.   Patient returned from dialysis hemodynamically stable on her home oxygen.  She feels symptomatically improved postdialysis.  She resides at [facility].  She denies any cough, fever or chills.  She is at her baseline oxygen requirement, has lungs clear to auscultation bilaterally and is in no distress and is stable for discharge back to [facility].       Regan Lemming, MD 02/06/23 1600

## 2023-02-06 NOTE — Discharge Instructions (Signed)
Please follow-up outpatient for regular scheduled dialysis treatments

## 2023-02-06 NOTE — ED Provider Notes (Signed)
[facility] Provider Note  CSN: [id number] Arrival date & time: 02/06/23 0448  Chief Complaint(s) Shortness of Breath PT BIB EMS from home with c/o SHOB x 1 day. Pt is a dialysis pt, last tx was Monday. Per EMS, snf staff states that she has been taking in more fluids than she is supposed to. HPI Nancy SOBOTTA is a 62 y.o. female with a past medical history listed below including ESRD on dialysis MWF, diastolic heart dysfunction, hypertension who presents from skilled nursing facility for shortness of breath.  Patient reported shortness of breath began yesterday and became severe last night.  Patient states that she went to dialysis on Monday and had a full session.  She is unsure of her dry weight.  She denies any associated chest pain.  No coughing or congestion.  No fevers or chills.  States that she is compliant with her medication including "water pill", stating that she still makes urine. (No diuretic noted on [facility])  The history is provided by the patient.    Past Medical History Past Medical History:  Diagnosis Date   Anemia 2006   Anginal pain ([place])    Carotid artery disease ([place])    right ICA occlusion, [id number] LICA 123456 [place]   CHF (congestive heart failure) ([place])    Coronary artery disease    Depression 2014   previously on amitryptiline    Diabetes mellitus with neurological manifestation ([place]) 2006   Diabetic peripheral neuropathy ([place]) 04/26/2020   Dysphagia    Dyspnea    ESRD on hemodialysis ([place])    Fracture of left ankle 1997   Gastroparesis 07/2016   Heart murmur    HOH (hard of hearing) 2004   Hyperlipidemia 2006   Hypertension 2006   IBS (irritable bowel syndrome) 2002   Leukopenia 2015   Macular degeneration 11/2019   Peripheral vascular disease ([place])    Shingles 2009   Stroke [facility]) 2020   Thyroid nodule 2004   Patient Active Problem List   Diagnosis Date Noted   Acute respiratory failure with hypoxia  ([place]) 11/04/2022   Pain, unspecified [id number]   Complication of vascular dialysis catheter 08/07/2022   Chest pain 05/30/2022   Fluid overload 05/30/2022   Acute pulmonary edema ([place]) 05/30/2022   Iron deficiency anemia, unspecified 04/26/2022   Moderate protein-calorie malnutrition ([place]) 04/18/2022   Nausea 04/10/2022   Hyponatremia 04/04/2022   Prolonged QT interval 04/04/2022   Anaphylactic shock, unspecified, initial encounter 02/23/2022   Coagulation defect, unspecified ([place]) 02/23/2022   Other allergy, initial encounter 02/23/2022   Pressure injury of skin 02/19/2022   Anemia in chronic kidney disease 02/16/2022   Dependence on renal dialysis ([place]) 02/16/2022   Hyperlipidemia, unspecified 02/16/2022   Hypertensive heart and chronic kidney disease with heart failure and with stage 5 chronic kidney disease, or end stage renal disease ([place]) 02/16/2022   Personal history of nicotine dependence 02/16/2022   Personal history of transient ischemic attack (TIA), and cerebral infarction without residual deficits 02/16/2022   Secondary hyperparathyroidism of renal origin ([place]) 02/16/2022   Type 2 diabetes mellitus with diabetic peripheral angiopathy without gangrene ([place]) 02/16/2022   Renal anasarca 02/14/2022   ESRD on dialysis ([place]) 02/14/2022   Hyperkalemia 02/14/2022   S/P arteriovenous (AV) fistula creation 12/22/2021   Critical limb ischemia of both lower extremities ([place]) 11/22/2021   Subacute osteomyelitis, right ankle and foot ([place])    Peripheral artery disease ([place]) 10/27/2021   Acute exacerbation  of congestive heart failure ([place]) 10/24/2021   Acute cardiogenic pulmonary edema ([place]) 10/06/2021   CAD (coronary artery disease) 10/06/2021   Goals of care, counseling/discussion    Gangrene of left foot ([place])    Cellulitis of left foot    Diabetic foot ulcers ([place]) 09/04/2021   CHF (congestive heart failure) ([place]) 07/07/2021   Acute postoperative anemia due to expected blood  loss superimposed on anemia of chronic renal insufficiency 06/30/2021   CKD (chronic kidney disease), stage IV (HCC) 06/11/2021   Chronic diastolic CHF (congestive heart failure) ([place]) 06/10/2021   Abnormal nuclear stress test    Dyspnea on exertion 03/07/2021   Orthopnea 02/25/2021   History of cerebrovascular accident (CVA) with residual deficit 05/06/2020   Diabetic peripheral neuropathy ([place]) 04/26/2020   AKI (acute kidney injury) ([place]) 04/20/2020   Generalized weakness 04/20/2020   Dehydration 04/20/2020   Generalized abdominal pain    Pancreatitis, acute 02/29/2020   Cerebrovascular accident (CVA) ([place]) 11/08/2019   Stenosis of right carotid artery 11/08/2019   Mixed diabetic hyperlipidemia associated with type 2 diabetes mellitus ([place]) 11/08/2019   Cortical age-related cataract of both eyes 08/30/2019   Gait abnormality 08/30/2019   Statin declined 08/30/2019   Gastroesophageal reflux disease without esophagitis 04/18/2018   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   Insulin dependent type 2 diabetes mellitus ([place]) 10/29/2017   History of macular degeneration 10/29/2017   Chronic cervical pain 10/29/2016   Myalgia 09/14/2016   Insomnia 09/14/2016   Tinea pedis of both feet 08/20/2016   Macular degeneration 08/17/2016   Low back pain 09/21/2014   Hypertensive urgency 09/21/2014   Diabetic gastroparesis associated with type 2 diabetes mellitus ([place]) 09/14/2014   HTN (hypertension) 09/08/2014   Thyroid nodule 08/17/2014   Morbid obesity ([place]) 08/17/2014   Hypokalemia 08/06/2014   Type II diabetes mellitus with neurological manifestations, uncontrolled 08/04/2014   Home Medication(s) Prior to Admission medications   Medication Sig Start Date End Date Taking? Authorizing Provider  amLODipine (NORVASC) 10 MG tablet Take 1 tablet (10 mg total) by mouth daily. 06/04/22   Shelly Coss, MD  aspirin 81 MG EC tablet Take 1 tablet (81 mg total) by mouth daily. 09/26/21   Milford, Maricela Bo, FNP   atorvastatin (LIPITOR) 40 MG tablet Take 1 tablet (40 mg total) by mouth daily. 11/07/21   Larey Dresser, MD  Blood Glucose Monitoring Suppl [facility] VERIO) w/Device KIT Check blood sugar three times daily. E11.40 05/06/20   [name], MD  Continuous Blood Gluc Receiver (DEXCOM G6 RECEIVER) DEVI 1 Device by Does not apply route daily. 11/15/21   [name], MD  Continuous Blood Gluc Sensor (DEXCOM G6 SENSOR) MISC 1 packet by Does not apply route daily. 11/15/21   [name], MD  Continuous Blood Gluc Transmit (DEXCOM G6 TRANSMITTER) MISC 1 packet by Does not apply route daily. 11/15/21   [name], MD  DM-Doxylamine-Acetaminophen (NYQUIL HBP COLD & FLU) 15-6.25-325 MG/15ML LIQD Take 30 mLs by mouth every 8 (eight) hours as needed (Cold).    [provider]  ezetimibe (ZETIA) 10 MG tablet Take 1 tablet (10 mg total) by mouth daily. 04/11/22   Danford, Suann Larry, MD  ferrous sulfate 325 (65 FE) MG tablet Take 1 tablet (325 mg total) by mouth daily with breakfast. 11/07/21   [name], MD  glucose blood test strip Use as instructedCheck blood sugar three times daily. E11.40 07/11/20   [name], MD  HYDROmorphone (DILAUDID) 2 MG  tablet Take 0.5 tablets (1 mg total) by mouth every 6 (six) hours as needed for severe pain (pain score 7-10). 11/22/22   Domenic Polite, MD  insulin glargine (LANTUS) 100 UNIT/ML Solostar Pen Inject 10 Units into the skin at bedtime. 04/10/22   Danford, Suann Larry, MD  Insulin Pen Needle 32G X 4 MM MISC Use to inject insulin as directed. 11/07/21   [name], MD  isosorbide mononitrate (IMDUR) 30 MG 24 hr tablet Take 1 tablet (30 mg total) by mouth daily. 06/04/22   Shelly Coss, MD  isosorbide mononitrate (IMDUR) 60 MG 24 hr tablet Take 1 tablet (60 mg total) by mouth daily. 11/23/22   Domenic Polite, MD  labetalol (NORMODYNE) 300 MG tablet Take 1 tablet (300 mg total) by mouth 2 (two) times daily.  06/04/22   Shelly Coss, MD  multivitamin (RENA-VIT) TABS tablet Take 1 tablet by mouth at bedtime. Patient taking differently: Take 1 tablet by mouth daily. 04/10/22   Danford, Suann Larry, MD  NOVOLOG FLEXPEN 100 UNIT/ML FlexPen Inject 3 Units into the skin in the morning and at bedtime. 06/04/22   [provider]  ondansetron (ZOFRAN) 8 MG tablet Take 1 tablet (8 mg total) by mouth every 8 (eight) hours as needed for nausea 05/23/22     pantoprazole (PROTONIX) 40 MG tablet Take 1 tablet (40 mg total) by mouth daily. 04/11/22   Danford, Suann Larry, MD  polyethylene glycol (MIRALAX / GLYCOLAX) 17 g packet Take 17 g by mouth daily. 06/04/22   Shelly Coss, MD  promethazine (PHENERGAN) 25 MG tablet Take 25 mg by mouth every 8 (eight) hours as needed for vomiting or nausea. 04/23/22   [provider]  senna-docusate (SENOKOT-S) 8.6-50 MG tablet Take 1 tablet by mouth at bedtime. 11/22/22   Domenic Polite, MD  sevelamer (RENAGEL) 800 MG tablet Take 1,600 mg by mouth 3 (three) times daily. 07/06/22   [provider]  zinc sulfate 220 (50 Zn) MG capsule Take 1 capsule (220 mg total) by mouth daily. 04/11/22   Danford, Suann Larry, MD  Insulin NPH Isophane & Regular (RELION 70/30 [place]) Inject 35 Units into the skin 2 (two) times daily.  08/17/14  [provider]                                                                                                                                    Allergies Hydralazine, Hydralazine hcl, Hydrocodone, Metformin and related, Other, Plaquenil [hydroxychloroquine sulfate], Shellfish allergy, Shellfish-derived products, Shrimp (diagnostic), and Sulfa antibiotics  Review of Systems Review of Systems As noted in HPI  Physical Exam Vital Signs  I have reviewed the triage vital signs BP (!) 188/88  Pulse 83   Temp (!) 97.5 F (36.4 C) (Oral)   Resp 14   SpO2 100%   Physical Exam Vitals reviewed.  Constitutional:       General: She is not in  acute distress.    Appearance: She is well-developed. She is not diaphoretic.  HENT:     Head: Normocephalic and atraumatic.     Nose: Nose normal.  Eyes:     General: No scleral icterus.       Right eye: No discharge.        Left eye: No discharge.     Conjunctiva/sclera: Conjunctivae normal.     Pupils: Pupils are equal, round, and reactive to light.  Cardiovascular:     Rate and Rhythm: Normal rate and regular rhythm.     Heart sounds: No murmur heard.    No friction rub. No gallop.  Pulmonary:     Effort: Pulmonary effort is normal. No respiratory distress.     Breath sounds: Normal breath sounds. No stridor or decreased air movement. No wheezing, rhonchi or rales.  Abdominal:     General: There is no distension.     Palpations: Abdomen is soft.     Tenderness: There is no abdominal tenderness.  Musculoskeletal:        General: No tenderness.     Cervical back: Normal range of motion and neck supple.     Right lower leg: No edema.     Left lower leg: No edema.  Skin:    General: Skin is warm and dry.     Findings: No erythema or rash.  Neurological:     Mental Status: She is alert and oriented to person, place, and time.     ED Results and Treatments Labs (all labs ordered are listed, but only abnormal results are displayed) Labs Reviewed  COMPREHENSIVE METABOLIC PANEL - Abnormal; Notable for the following components:      Result Value   Potassium 5.8 (*)    Glucose, Bld 122 (*)    BUN 32 (*)    Creatinine, Ser 4.72 (*)    Calcium 8.7 (*)    Total Bilirubin 1.6 (*)    GFR, Estimated 10 (*)    All other components within normal limits  CBC - Abnormal; Notable for the following components:   WBC 13.8 (*)    RBC 3.32 (*)    Hemoglobin 9.9 (*)    HCT 32.4 (*)    RDW 17.0 (*)    All other components within normal limits  I-STAT VENOUS BLOOD GAS, ED - Abnormal; Notable for the following components:   pH, Ven 7.632 (*)    pCO2, Ven 23.1  (*)    pO2, Ven 190 (*)    Acid-Base Excess 4.0 (*)    Sodium 132 (*)    Potassium 5.7 (*)    Calcium, Ion 0.80 (*)    HCT 34.0 (*)    Hemoglobin 11.6 (*)    All other components within normal limits  I-STAT CHEM 8, ED - Abnormal; Notable for the following components:   BUN 37 (*)    Creatinine, Ser 5.10 (*)    Glucose, Bld 129 (*)    Calcium, Ion 1.04 (*)    All other components within normal limits  RESP PANEL BY RT-PCR (RSV, FLU A&B, COVID)  RVPGX2  EKG  EKG Interpretation  Date/Time:  Wednesday February 06 2023 04:57:47 EST Ventricular Rate:  99 PR Interval:  168 QRS Duration: 83 QT Interval:  361 QTC Calculation: 464 R Axis:   -28 Text Interpretation: Sinus rhythm Borderline left axis deviation Confirmed by Addison Lank [phone]) on 02/06/2023 6:47:16 AM       Radiology DG Chest Port 1 View  Result Date: 02/06/2023 CLINICAL DATA:  62 year old female dialysis patient with shortness of breath. EXAM: PORTABLE CHEST 1 VIEW COMPARISON:  Portable chest 11/21/2022 and earlier. FINDINGS: Portable AP upright view at 0509 hours. Previously seen right chest dual lumen dialysis type catheter has been removed. Lung volumes have not significantly changed but there is new coarse bilateral but asymmetric pulmonary interstitial opacity, most confluent at the hila. No pneumothorax. No pleural effusion or consolidation is evident. Cardiac and mediastinal contours remain within normal limits. Visualized tracheal air column is within normal limits. No acute osseous abnormality identified. Paucity of bowel gas in the upper abdomen. IMPRESSION: 1. Acute asymmetric pulmonary interstitial opacity, most confluent at the hila. Top differential considerations include pulmonary edema and viral/atypical respiratory infection. 2. Interval removal of right chest dialysis catheter.  Electronically Signed   By: Genevie Ann M.D.   On: 02/06/2023 05:37    Medications Ordered in ED Medications  ondansetron (ZOFRAN) injection 4 mg (4 mg Intravenous Given 02/06/23 0617)  nitroGLYCERIN (NITROGLYN) 2 % ointment 1 inch (1 inch Topical Given 02/06/23 0617)  sodium polystyrene (KAYEXALATE) 15 GM/60ML suspension 30 g (30 g Oral Given 02/06/23 0655)                                                                                                                                     Procedures .Critical Care  Performed by: Fatima Blank, MD Authorized by: Fatima Blank, MD   Critical care provider statement:    Critical care time (minutes):  45   Critical care time was exclusive of:  Separately billable procedures and treating other patients   Critical care was necessary to treat or prevent imminent or life-threatening deterioration of the following conditions:  Respiratory failure, renal failure and circulatory failure   Critical care was time spent personally by me on the following activities:  Development of treatment plan with patient or surrogate, discussions with consultants, evaluation of patient's response to treatment, examination of patient, obtaining history from patient or surrogate, review of old charts, re-evaluation of patient's condition, pulse oximetry, ordering and review of radiographic studies, ordering and review of laboratory studies and ordering and performing treatments and interventions   (including critical care time)  Medical Decision Making / ED Course   Medical Decision Making Amount and/or Complexity of Data Reviewed Labs: ordered. Decision-making details documented in ED Course. Radiology: ordered and independent interpretation performed. Decision-making details documented in ED Course. ECG/medicine tests: ordered and independent interpretation performed. Decision-making details documented in ED Course.  Risk  Prescription drug  management. Decision regarding hospitalization.   This patient presents to the ED for concern of SOB, this involves an extensive number of treatment options, and is a complaint that carries with it a high risk of complications and morbidity. The differential diagnosis includes but not limited to flash pulmonary edema, hypertensive urgency, volume overload, pneumonia, pneumothorax.  EKG without acute ischemic changes, dysrhythmias or blocks. Chest x-ray with a asymmetric pulmonary edema.  Possible viral infection Viral panel negative for COVID, influenza, RSV. CBC with leukocytosis. Mild anemia hemoglobin relatively stable from 2 months ago. VBG without evidence of hypercarbia or respiratory acidosis Metabolic panel with mild hyperkalemia.  Renal function at her baseline.  Patient provided with nitroglycerin to assist with pressure control. Also provided with Kayexalate for hyperkalemia.  After initial treatment, patient is satting well on her home oxygen.  Will consult nephrology for emergent dialysis.  Anticipate discharge home afterward.          Final Clinical Impression(s) / ED Diagnoses Final diagnoses:  SOB (shortness of breath)           This chart was dictated using voice recognition software.  Despite best efforts to proofread,  errors can occur which can change the documentation meaning.    Fatima Blank, MD 02/06/23 0830

## 2023-02-06 NOTE — Progress Notes (Signed)
Received patient in bed to unit.  Alert and oriented.  Informed consent signed and in chart.   TX duration:3.25  Patient tolerated well.  Transported back to the room  Alert, without acute distress.  Hand-off given to patient's nurse.   Access used: right AVF Access issues: NONE  Total UF removed: 2.4L Medication(s) given: 50MG BENADRYL    02/06/23 1515  Vitals  Temp 98.1 F (36.7 C)  Temp Source Oral  BP 137/82  MAP (mmHg) 98  BP Location Left Arm  BP Method Automatic  Patient Position (if appropriate) Lying  Pulse Rate 75  Pulse Rate Source Monitor  ECG Heart Rate 75  Resp 14  Oxygen Therapy  SpO2 100 %  O2 Device Nasal Cannula  O2 Flow Rate (L/min) 3 L/min  During Treatment Monitoring  Blood Flow Rate (mL/min) 400 mL/min  Arterial Pressure (mmHg) -150 mmHg  Venous Pressure (mmHg) 250 mmHg  TMP (mmHg) 9 mmHg  Ultrafiltration Rate (mL/min) 536 mL/min  Dialysate Flow Rate (mL/min) 300 ml/min  HD Safety Checks Performed Yes  Intra-Hemodialysis Comments Tx completed  Dialysis Fluid Bolus Normal Saline  Bolus Amount (mL) 300 mL  Post Treatment  Dialyzer Clearance Lightly streaked  Duration of HD Treatment -hour(s) 3.25 hour(s)  Liters Processed 78  Fluid Removed (mL) 2400 mL  Tolerated HD Treatment Yes  AVG/AVF Arterial Site Held (minutes) 5 minutes  AVG/AVF Venous Site Held (minutes) 5 minutes  Fistula / Graft Right Upper arm Arteriovenous fistula  Placement Date/Time: (c) 12/22/21 1525   Placed prior to admission: No  Orientation: Right  Access Location: Upper arm  Access Type: Arteriovenous fistula  Status Flushed;Deaccessed  Drainage Description None    Lorra Freeman S [facility]

## 2023-02-06 NOTE — Progress Notes (Signed)
Reviewed consult for IV. IV obtained by ED. Consult cleared.

## 2023-02-07 LAB — HEPATITIS B SURFACE ANTIBODY, QUANTITATIVE: Hep B S AB Quant (Post): 46.2 m[IU]/mL (ref >9.9)

## 2023-03-08 DIAGNOSIS — Z4802 Encounter for removal of sutures: Secondary | ICD-10-CM | POA: Insufficient documentation

## 2023-03-11 ENCOUNTER — Encounter (HOSPITAL_COMMUNITY): Payer: Self-pay

## 2023-03-11 ENCOUNTER — Emergency Department (HOSPITAL_COMMUNITY)
Admission: EM | Admit: 2023-03-11 | Discharge: 2023-03-11 | Disposition: A | Discharge status: Home / Self Care | Payer: Medicare Other | Attending: Emergency Medicine | Admitting: Emergency Medicine

## 2023-03-11 ENCOUNTER — Emergency Department (HOSPITAL_COMMUNITY): Payer: Medicare Other

## 2023-03-11 DIAGNOSIS — Z79899 Other long term (current) drug therapy: Secondary | ICD-10-CM | POA: Diagnosis not present

## 2023-03-11 DIAGNOSIS — Z992 Dependence on renal dialysis: Secondary | ICD-10-CM | POA: Diagnosis not present

## 2023-03-11 DIAGNOSIS — N186 End stage renal disease: Secondary | ICD-10-CM | POA: Diagnosis not present

## 2023-03-11 DIAGNOSIS — Z794 Long term (current) use of insulin: Secondary | ICD-10-CM | POA: Diagnosis not present

## 2023-03-11 DIAGNOSIS — I509 Heart failure, unspecified: Secondary | ICD-10-CM | POA: Insufficient documentation

## 2023-03-11 DIAGNOSIS — E875 Hyperkalemia: Secondary | ICD-10-CM

## 2023-03-11 DIAGNOSIS — R0602 Shortness of breath: Secondary | ICD-10-CM

## 2023-03-11 DIAGNOSIS — I132 Hypertensive heart and chronic kidney disease with heart failure and with stage 5 chronic kidney disease, or end stage renal disease: Secondary | ICD-10-CM | POA: Insufficient documentation

## 2023-03-11 DIAGNOSIS — E1122 Type 2 diabetes mellitus with diabetic chronic kidney disease: Secondary | ICD-10-CM | POA: Diagnosis not present

## 2023-03-11 DIAGNOSIS — R1900 Intra-abdominal and pelvic swelling, mass and lump, unspecified site: Secondary | ICD-10-CM | POA: Diagnosis not present

## 2023-03-11 DIAGNOSIS — Z7982 Long term (current) use of aspirin: Secondary | ICD-10-CM | POA: Insufficient documentation

## 2023-03-11 DIAGNOSIS — M546 Pain in thoracic spine: Secondary | ICD-10-CM | POA: Insufficient documentation

## 2023-03-11 LAB — CBC WITH DIFFERENTIAL/PLATELET
Abs Immature Granulocytes: 0.03 10*3/uL (ref 0.00–0.07)
Basophils Absolute: 0.1 10*3/uL (ref 0.0–0.1)
Basophils Relative: 1 %
Eosinophils Absolute: 0.8 10*3/uL — ABNORMAL HIGH (ref 0.0–0.5)
Eosinophils Relative: 8 %
HCT: 39.7 % (ref 36.0–46.0)
Hemoglobin: 11.9 g/dL — ABNORMAL LOW (ref 12.0–15.0)
Immature Granulocytes: 0 %
Lymphocytes Relative: 21 %
Lymphs Abs: 2.2 10*3/uL (ref 0.7–4.0)
MCH: 29.2 pg (ref 26.0–34.0)
MCHC: 30 g/dL (ref 30.0–36.0)
MCV: 97.5 fL (ref 80.0–100.0)
Monocytes Absolute: 1 10*3/uL (ref 0.1–1.0)
Monocytes Relative: 10 %
Neutro Abs: 6.3 10*3/uL (ref 1.7–7.7)
Neutrophils Relative %: 60 %
Platelets: 267 10*3/uL (ref 150–400)
RBC: 4.07 MIL/uL (ref 3.87–5.11)
RDW: 16.3 % — ABNORMAL HIGH (ref 11.5–15.5)
WBC: 10.4 10*3/uL (ref 4.0–10.5)
nRBC: 0 % (ref 0.0–0.2)

## 2023-03-11 LAB — COMPREHENSIVE METABOLIC PANEL
ALT: 16 U/L (ref 0–44)
AST: 24 U/L (ref 15–41)
Albumin: 3.7 g/dL (ref 3.5–5.0)
Alkaline Phosphatase: 92 U/L (ref 38–126)
Anion gap: 16 — ABNORMAL HIGH (ref 5–15)
BUN: 62 mg/dL — ABNORMAL HIGH (ref 8–23)
CO2: 26 mmol/L (ref 22–32)
Calcium: 8.7 mg/dL — ABNORMAL LOW (ref 8.9–10.3)
Chloride: 94 mmol/L — ABNORMAL LOW (ref 98–111)
Creatinine, Ser: 7.39 mg/dL — ABNORMAL HIGH (ref 0.44–1.00)
GFR, Estimated: 6 mL/min — ABNORMAL LOW (ref >60)
Glucose, Bld: 113 mg/dL — ABNORMAL HIGH (ref 70–99)
Potassium: 5.3 mmol/L — ABNORMAL HIGH (ref 3.5–5.1)
Sodium: 136 mmol/L (ref 135–145)
Total Bilirubin: 1 mg/dL (ref 0.3–1.2)
Total Protein: 7.3 g/dL (ref 6.5–8.1)

## 2023-03-11 LAB — TROPONIN I (HIGH SENSITIVITY)
Troponin I (High Sensitivity): 13 ng/L (ref <18)
Troponin I (High Sensitivity): 12 ng/L (ref <18)

## 2023-03-11 LAB — LIPASE, BLOOD: Lipase: 38 U/L (ref 11–51)

## 2023-03-11 MED ORDER — NITROGLYCERIN 2 % TD OINT
1.0000 [in_us] | TOPICAL_OINTMENT | Freq: Once | TRANSDERMAL | Status: AC
  Administered 2023-03-11: 1 [in_us] via TOPICAL
  Filled 2023-03-11: qty 1

## 2023-03-11 MED ORDER — HYDROMORPHONE HCL 2 MG PO TABS
2.0000 mg | ORAL_TABLET | Freq: Once | ORAL | Status: AC
  Administered 2023-03-11: 2 mg via ORAL
  Filled 2023-03-11: qty 1

## 2023-03-11 MED ORDER — FENTANYL CITRATE PF 50 MCG/ML IJ SOSY
50.0000 ug | PREFILLED_SYRINGE | Freq: Once | INTRAMUSCULAR | Status: AC
  Administered 2023-03-11: 50 ug via INTRAVENOUS
  Filled 2023-03-11: qty 1

## 2023-03-11 NOTE — ED Provider Notes (Signed)
Nancy [name]   CSN: [id number] Arrival date & time: 03/11/23  0055     History  Chief Complaint  Patient presents with   Shortness of Breath   Back Pain    Nancy [name] is a 62 y.o. female.  The history is provided by the patient, medical records and the spouse.  Shortness of Breath Back Pain Nancy [name] is a 62 y.o. female who presents to the Emergency Department complaining of shortness of breath.  She presents to the emergency department by EMS from home for evaluation of shortness of breath and back pain.  Symptoms started around noon today.  She complains of pain to the mid upper back with associated difficulty breathing.  She has experienced similar symptoms in the past when she needs dialysis.  She does have ESRD and dialyzes Monday, Wednesday, Friday.  Her last session was on Friday and she received a full session.  She does report increased fluid intake over the last several days due to feeling thirsty and thinks she may have drink too much fluid.  She reports abdominal swelling but no lower extremity swelling.  No fevers, chest pain, abdominal pain.  She does still make urine.  She is compliant with all of her home medications. She has a history of ESRD on dialysis, diabetes, hypertension, CHF.    Home Medications Prior to Admission medications   Medication Sig Start Date End Date Taking? Authorizing Provider  amLODipine (NORVASC) 10 MG tablet Take 1 tablet (10 mg total) by mouth daily. 06/04/22   Shelly Coss, MD  aspirin 81 MG EC tablet Take 1 tablet (81 mg total) by mouth daily. 09/26/21   Milford, Maricela Bo, FNP  atorvastatin (LIPITOR) 40 MG tablet Take 1 tablet (40 mg total) by mouth daily. 11/07/21   Larey Dresser, MD  Blood Glucose Monitoring Suppl [facility] VERIO) w/Device KIT Check blood sugar three times daily. E11.40 05/06/20   [name], MD  Continuous Blood Gluc Receiver  (DEXCOM G6 RECEIVER) DEVI 1 Device by Does not apply route daily. 11/15/21   [name], MD  Continuous Blood Gluc Sensor (DEXCOM G6 SENSOR) MISC 1 packet by Does not apply route daily. 11/15/21   [name], MD  Continuous Blood Gluc Transmit (DEXCOM G6 TRANSMITTER) MISC 1 packet by Does not apply route daily. 11/15/21   [name], MD  DM-Doxylamine-Acetaminophen (NYQUIL HBP COLD & FLU) 15-6.25-325 MG/15ML LIQD Take 30 mLs by mouth every 8 (eight) hours as needed (Cold).    [provider]  ezetimibe (ZETIA) 10 MG tablet Take 1 tablet (10 mg total) by mouth daily. 04/11/22   Danford, Suann Larry, MD  ferrous sulfate 325 (65 FE) MG tablet Take 1 tablet (325 mg total) by mouth daily with breakfast. 11/07/21   [name], MD  glucose blood test strip Use as instructedCheck blood sugar three times daily. E11.40 07/11/20   [name], MD  HYDROmorphone (DILAUDID) 2 MG tablet Take 0.5 tablets (1 mg total) by mouth every 6 (six) hours as needed for severe pain (pain score 7-10). 11/22/22   Domenic Polite, MD  insulin glargine (LANTUS) 100 UNIT/ML Solostar Pen Inject 10 Units into the skin at bedtime. 04/10/22   Danford, Suann Larry, MD  Insulin Pen Needle 32G X 4 MM MISC Use to inject insulin as directed. 11/07/21   [name], MD  isosorbide mononitrate (IMDUR) 30 MG 24 hr tablet  Take 1 tablet (30 mg total) by mouth daily. 06/04/22   Shelly Coss, MD  isosorbide mononitrate (IMDUR) 60 MG 24 hr tablet Take 1 tablet (60 mg total) by mouth daily. 11/23/22   Domenic Polite, MD  labetalol (NORMODYNE) 300 MG tablet Take 1 tablet (300 mg total) by mouth 2 (two) times daily. 06/04/22   Shelly Coss, MD  multivitamin (RENA-VIT) TABS tablet Take 1 tablet by mouth at bedtime. Patient taking differently: Take 1 tablet by mouth daily. 04/10/22   Danford, Suann Larry, MD  NOVOLOG FLEXPEN 100 UNIT/ML FlexPen Inject 3 Units into the skin in the morning and at  bedtime. 06/04/22   [provider]  ondansetron (ZOFRAN) 8 MG tablet Take 1 tablet (8 mg total) by mouth every 8 (eight) hours as needed for nausea 05/23/22     pantoprazole (PROTONIX) 40 MG tablet Take 1 tablet (40 mg total) by mouth daily. 04/11/22   Danford, Suann Larry, MD  polyethylene glycol (MIRALAX / GLYCOLAX) 17 g packet Take 17 g by mouth daily. 06/04/22   Shelly Coss, MD  promethazine (PHENERGAN) 25 MG tablet Take 25 mg by mouth every 8 (eight) hours as needed for vomiting or nausea. 04/23/22   [provider]  senna-docusate (SENOKOT-S) 8.6-50 MG tablet Take 1 tablet by mouth at bedtime. 11/22/22   Domenic Polite, MD  sevelamer (RENAGEL) 800 MG tablet Take 1,600 mg by mouth 3 (three) times daily. 07/06/22   [provider]  zinc sulfate 220 (50 Zn) MG capsule Take 1 capsule (220 mg total) by mouth daily. 04/11/22   Danford, Suann Larry, MD  Insulin NPH Isophane & Regular (RELION 70/30 ) Inject 35 Units into the skin 2 (two) times daily.  08/17/14  [provider]      Allergies    Hydralazine, Hydralazine hcl, Hydrocodone, Metformin and related, Other, Plaquenil [hydroxychloroquine sulfate], Shellfish allergy, Shellfish-derived products, Shrimp (diagnostic), and Sulfa antibiotics    Review of Systems   Review of Systems  Respiratory:  Positive for shortness of breath.   Musculoskeletal:  Positive for back pain.  All other systems reviewed and are negative.   Physical Exam Updated Vital Signs BP (!) 193/100   Pulse 83   Temp 98.5 F (36.9 C) (Oral)   Resp 19   Ht 5\' 3"  (1.6 m)   Wt 96.2 kg   SpO2 100%   BMI 37.55 kg/m  Physical Exam Vitals and nursing [name] reviewed.  Constitutional:      Appearance: She is well-developed.  HENT:     Head: Normocephalic and atraumatic.  Cardiovascular:     Rate and Rhythm: Normal rate and regular rhythm.     Heart sounds: No murmur heard. Pulmonary:     Effort: Pulmonary effort is normal. No  respiratory distress.     Breath sounds: Normal breath sounds.  Abdominal:     Palpations: Abdomen is soft.     Tenderness: There is no abdominal tenderness. There is no guarding or rebound.  Musculoskeletal:        General: No tenderness.     Comments: Bilateral BKA without any lower extremity edema.  Skin:    General: Skin is warm and dry.  Neurological:     Mental Status: She is alert and oriented to person, place, and time.  Psychiatric:        Behavior: Behavior normal.     ED Results / Procedures / Treatments   Labs (all labs ordered are listed, but only abnormal results  are displayed) Labs Reviewed  COMPREHENSIVE METABOLIC PANEL - Abnormal; Notable for the following components:      Result Value   Potassium 5.3 (*)    Chloride 94 (*)    Glucose, Bld 113 (*)    BUN 62 (*)    Creatinine, Ser 7.39 (*)    Calcium 8.7 (*)    GFR, Estimated 6 (*)    Anion gap 16 (*)    All other components within normal limits  CBC WITH DIFFERENTIAL/PLATELET - Abnormal; Notable for the following components:   Hemoglobin 11.9 (*)    RDW 16.3 (*)    Eosinophils Absolute 0.8 (*)    All other components within normal limits  LIPASE, BLOOD  TROPONIN I (HIGH SENSITIVITY)  TROPONIN I (HIGH SENSITIVITY)    EKG EKG Interpretation  Date/Time:  Monday March 11 2023 03:32:25 EDT Ventricular Rate:  81 PR Interval:  160 QRS Duration: 85 QT Interval:  387 QTC Calculation: 450 R Axis:   -39 Text Interpretation: Sinus rhythm Probable left atrial enlargement Abnormal R-wave progression, late transition Left ventricular hypertrophy Abnormal T, consider ischemia, lateral leads Confirmed by Quintella Reichert [phone]) on 03/11/2023 3:59:09 AM  Radiology DG Chest 2 View  Result Date: 03/11/2023 CLINICAL DATA:  Shortness of breath EXAM: CHEST - 2 VIEW COMPARISON:  02/06/2023 FINDINGS: Shallow inspiration. Cardiac enlargement. No vascular congestion, edema, or consolidation. No pleural effusions. No  pneumothorax. Mediastinal contours appear intact. IMPRESSION: Cardiac enlargement.  No evidence of active pulmonary disease. Electronically Signed   By: Lucienne Capers M.D.   On: 03/11/2023 03:25    Procedures Procedures    Medications Ordered in ED Medications  fentaNYL (SUBLIMAZE) injection 50 mcg (50 mcg Intravenous Given 03/11/23 0345)  nitroGLYCERIN (NITROGLYN) 2 % ointment 1 inch (1 inch Topical Given 03/11/23 [id number])    ED Course/ Medical Decision Making/ A&P                             Medical Decision Making Amount and/or Complexity of Data Reviewed Labs: ordered. Radiology: ordered.  Risk Prescription drug management.   Patient with ESRD on hemodialysis, hypertension, diabetes here for evaluation of shortness of breath and upper back pain in the setting of drinking increased fluids over the last several days.  She is nontoxic-appearing on evaluation with no respiratory distress.  She is breathing comfortably on her home oxygen settings with no increased work of breathing.  EKG is abnormal but similar when compared to priors in the system.  Her back pain resolved after treatment in the emergency department.  Chest x-ray without acute pulmonary edema-images personally reviewed and interpreted, agree with radiologist interpretation.  BMP with mild hyperkalemia.  Troponins are negative x 2.  Her blood pressure is elevated in the department, did improve after Nitropaste.  She is due for her morning meds but is not sure what they are.  She is a resident of a skilled nursing facility and current MAR is not available.  Current clinical picture is not consistent with PE, ACS, hypertensive urgency.  Suspect patient needs dialysis today.  Her time per patient is not until 1030.  Feel she is stable to discharge to facility on her home oxygen with plan for dialysis today.  Patient states that she can get her dialysis as scheduled.  Plan to discharge with scheduled dialysis later  today.        Final Clinical Impression(s) / ED Diagnoses Final diagnoses:  SOB (  shortness of breath)  ESRD (end stage renal disease) on dialysis [facility])  Hyperkalemia    Rx / DC Orders ED Discharge Orders     None         Quintella Reichert, MD 03/11/23 [phone]

## 2023-03-11 NOTE — ED Notes (Signed)
Assumed care of pt in hall bed who arrived via ems c/o back pain and sob. Pt is dialysis pt who is due for dialysis today . Pt htn respirations even and non labored 100% on home 2 liters no other issues or complaints noted.

## 2023-03-11 NOTE — ED Notes (Signed)
Attempted several times to call [facility] no answer only able to leave voice mail. [phone]

## 2023-03-11 NOTE — ED Notes (Signed)
Called [facility] to transport patient back to [facility] 

## 2023-03-11 NOTE — ED Notes (Signed)
Attempt to call report to facility no answer

## 2023-03-11 NOTE — Discharge Instructions (Addendum)
Please follow-up with your dialysis later today.  Please continue to take your medications as prescribed.

## 2023-03-11 NOTE — ED Triage Notes (Signed)
Pt earlier today started having SOB and back pain. Pt states she feels like someone is sitting on her back. Per EMS, pt is on 2L at facility normally. Pt is hypertensive but otherwise VSS. Pt does take hypertensive medication, took medication this morning. Pt is dialysis pt, has not missed with a schedule M/W/F.

## 2023-03-22 ENCOUNTER — Encounter (HOSPITAL_COMMUNITY): Payer: Self-pay

## 2023-03-22 ENCOUNTER — Other Ambulatory Visit: Payer: Self-pay

## 2023-03-22 ENCOUNTER — Observation Stay (HOSPITAL_COMMUNITY)
Admission: EM | Admit: 2023-03-22 | Discharge: 2023-03-24 | Disposition: A | Discharge status: Skilled Nursing Facility | Payer: Medicare Other | Attending: Family Medicine | Admitting: Family Medicine

## 2023-03-22 ENCOUNTER — Emergency Department (HOSPITAL_COMMUNITY): Payer: Medicare Other

## 2023-03-22 DIAGNOSIS — Z7982 Long term (current) use of aspirin: Secondary | ICD-10-CM | POA: Diagnosis not present

## 2023-03-22 DIAGNOSIS — Z89511 Acquired absence of right leg below knee: Secondary | ICD-10-CM | POA: Diagnosis not present

## 2023-03-22 DIAGNOSIS — N186 End stage renal disease: Secondary | ICD-10-CM

## 2023-03-22 DIAGNOSIS — Z79899 Other long term (current) drug therapy: Secondary | ICD-10-CM | POA: Diagnosis not present

## 2023-03-22 DIAGNOSIS — E875 Hyperkalemia: Secondary | ICD-10-CM | POA: Diagnosis not present

## 2023-03-22 DIAGNOSIS — J189 Pneumonia, unspecified organism: Principal | ICD-10-CM | POA: Diagnosis present

## 2023-03-22 DIAGNOSIS — E8770 Fluid overload, unspecified: Secondary | ICD-10-CM | POA: Insufficient documentation

## 2023-03-22 DIAGNOSIS — D72829 Elevated white blood cell count, unspecified: Secondary | ICD-10-CM | POA: Diagnosis not present

## 2023-03-22 DIAGNOSIS — Z992 Dependence on renal dialysis: Secondary | ICD-10-CM

## 2023-03-22 DIAGNOSIS — D631 Anemia in chronic kidney disease: Secondary | ICD-10-CM | POA: Diagnosis not present

## 2023-03-22 DIAGNOSIS — Z8673 Personal history of transient ischemic attack (TIA), and cerebral infarction without residual deficits: Secondary | ICD-10-CM

## 2023-03-22 DIAGNOSIS — Z87891 Personal history of nicotine dependence: Secondary | ICD-10-CM | POA: Diagnosis not present

## 2023-03-22 DIAGNOSIS — R0602 Shortness of breath: Secondary | ICD-10-CM | POA: Diagnosis present

## 2023-03-22 DIAGNOSIS — N189 Chronic kidney disease, unspecified: Secondary | ICD-10-CM

## 2023-03-22 DIAGNOSIS — E119 Type 2 diabetes mellitus without complications: Secondary | ICD-10-CM

## 2023-03-22 DIAGNOSIS — I2510 Atherosclerotic heart disease of native coronary artery without angina pectoris: Secondary | ICD-10-CM | POA: Diagnosis not present

## 2023-03-22 DIAGNOSIS — I10 Essential (primary) hypertension: Secondary | ICD-10-CM | POA: Diagnosis present

## 2023-03-22 DIAGNOSIS — I132 Hypertensive heart and chronic kidney disease with heart failure and with stage 5 chronic kidney disease, or end stage renal disease: Secondary | ICD-10-CM | POA: Insufficient documentation

## 2023-03-22 DIAGNOSIS — I5032 Chronic diastolic (congestive) heart failure: Secondary | ICD-10-CM | POA: Diagnosis not present

## 2023-03-22 DIAGNOSIS — Z794 Long term (current) use of insulin: Secondary | ICD-10-CM

## 2023-03-22 DIAGNOSIS — Z89512 Acquired absence of left leg below knee: Secondary | ICD-10-CM | POA: Insufficient documentation

## 2023-03-22 DIAGNOSIS — Z1152 Encounter for screening for COVID-19: Secondary | ICD-10-CM | POA: Insufficient documentation

## 2023-03-22 DIAGNOSIS — E1122 Type 2 diabetes mellitus with diabetic chronic kidney disease: Secondary | ICD-10-CM | POA: Insufficient documentation

## 2023-03-22 LAB — CBC
HCT: 39 % (ref 36.0–46.0)
Hemoglobin: 11.8 g/dL — ABNORMAL LOW (ref 12.0–15.0)
MCH: 29.4 pg (ref 26.0–34.0)
MCHC: 30.3 g/dL (ref 30.0–36.0)
MCV: 97 fL (ref 80.0–100.0)
Platelets: 230 10*3/uL (ref 150–400)
RBC: 4.02 MIL/uL (ref 3.87–5.11)
RDW: 15.5 % (ref 11.5–15.5)
WBC: 17.9 10*3/uL — ABNORMAL HIGH (ref 4.0–10.5)
nRBC: 0 % (ref 0.0–0.2)

## 2023-03-22 LAB — BASIC METABOLIC PANEL
Anion gap: 14 (ref 5–15)
BUN: 35 mg/dL — ABNORMAL HIGH (ref 8–23)
CO2: 28 mmol/L (ref 22–32)
Calcium: 8.2 mg/dL — ABNORMAL LOW (ref 8.9–10.3)
Chloride: 96 mmol/L — ABNORMAL LOW (ref 98–111)
Creatinine, Ser: 6.23 mg/dL — ABNORMAL HIGH (ref 0.44–1.00)
GFR, Estimated: 7 mL/min — ABNORMAL LOW (ref >60)
Glucose, Bld: 102 mg/dL — ABNORMAL HIGH (ref 70–99)
Potassium: 5.6 mmol/L — ABNORMAL HIGH (ref 3.5–5.1)
Sodium: 138 mmol/L (ref 135–145)

## 2023-03-22 LAB — TROPONIN I (HIGH SENSITIVITY)
Troponin I (High Sensitivity): 9 ng/L (ref <18)
Troponin I (High Sensitivity): 7 ng/L (ref <18)

## 2023-03-22 LAB — SARS CORONAVIRUS 2 BY RT PCR: SARS Coronavirus 2 by RT PCR: NEGATIVE

## 2023-03-22 LAB — CBG MONITORING, ED: Glucose-Capillary: 112 mg/dL — ABNORMAL HIGH (ref 70–99)

## 2023-03-22 MED ORDER — AMLODIPINE BESYLATE 10 MG PO TABS
10.0000 mg | ORAL_TABLET | Freq: Every day | ORAL | Status: DC
  Administered 2023-03-23 – 2023-03-24 (×2): 10 mg via ORAL
  Filled 2023-03-22 (×3): qty 1

## 2023-03-22 MED ORDER — HEPARIN SODIUM (PORCINE) 5000 UNIT/ML IJ SOLN
5000.0000 [IU] | Freq: Three times a day (TID) | INTRAMUSCULAR | Status: DC
  Administered 2023-03-22 – 2023-03-24 (×3): 5000 [IU] via SUBCUTANEOUS
  Filled 2023-03-22 (×3): qty 1

## 2023-03-22 MED ORDER — SODIUM ZIRCONIUM CYCLOSILICATE 10 G PO PACK
10.0000 g | PACK | Freq: Once | ORAL | Status: AC
  Administered 2023-03-23: 10 g via ORAL
  Filled 2023-03-22: qty 1

## 2023-03-22 MED ORDER — SENNOSIDES-DOCUSATE SODIUM 8.6-50 MG PO TABS
1.0000 | ORAL_TABLET | Freq: Every day | ORAL | Status: DC
  Administered 2023-03-22 – 2023-03-23 (×2): 1 via ORAL
  Filled 2023-03-22 (×2): qty 1

## 2023-03-22 MED ORDER — AZITHROMYCIN 250 MG PO TABS
500.0000 mg | ORAL_TABLET | Freq: Once | ORAL | Status: AC
  Administered 2023-03-22: 500 mg via ORAL
  Filled 2023-03-22: qty 2

## 2023-03-22 MED ORDER — ATORVASTATIN CALCIUM 40 MG PO TABS
40.0000 mg | ORAL_TABLET | Freq: Every day | ORAL | Status: DC
  Administered 2023-03-22 – 2023-03-24 (×3): 40 mg via ORAL
  Filled 2023-03-22 (×3): qty 1

## 2023-03-22 MED ORDER — EZETIMIBE 10 MG PO TABS
10.0000 mg | ORAL_TABLET | Freq: Every day | ORAL | Status: DC
  Administered 2023-03-22 – 2023-03-24 (×3): 10 mg via ORAL
  Filled 2023-03-22 (×3): qty 1

## 2023-03-22 MED ORDER — POLYETHYLENE GLYCOL 3350 17 G PO PACK
17.0000 g | PACK | Freq: Every day | ORAL | Status: DC
  Administered 2023-03-22 – 2023-03-24 (×2): 17 g via ORAL
  Filled 2023-03-22 (×3): qty 1

## 2023-03-22 MED ORDER — SEVELAMER CARBONATE 800 MG PO TABS
1600.0000 mg | ORAL_TABLET | Freq: Three times a day (TID) | ORAL | Status: DC
  Administered 2023-03-23 – 2023-03-24 (×3): 1600 mg via ORAL
  Filled 2023-03-22 (×3): qty 2

## 2023-03-22 MED ORDER — SODIUM CHLORIDE 0.9 % IV SOLN
2.0000 g | INTRAVENOUS | Status: DC
  Administered 2023-03-23 – 2023-03-24 (×2): 2 g via INTRAVENOUS
  Filled 2023-03-22 (×2): qty 20

## 2023-03-22 MED ORDER — LABETALOL HCL 200 MG PO TABS
300.0000 mg | ORAL_TABLET | Freq: Two times a day (BID) | ORAL | Status: DC
  Administered 2023-03-23 – 2023-03-24 (×3): 300 mg via ORAL
  Filled 2023-03-22 (×4): qty 2

## 2023-03-22 MED ORDER — ASPIRIN 81 MG PO TBEC
81.0000 mg | DELAYED_RELEASE_TABLET | Freq: Every day | ORAL | Status: DC
  Administered 2023-03-22 – 2023-03-24 (×3): 81 mg via ORAL
  Filled 2023-03-22 (×3): qty 1

## 2023-03-22 MED ORDER — MORPHINE SULFATE (PF) 2 MG/ML IV SOLN
1.0000 mg | Freq: Once | INTRAVENOUS | Status: AC
  Administered 2023-03-22: 1 mg via INTRAVENOUS
  Filled 2023-03-22: qty 1

## 2023-03-22 MED ORDER — SODIUM CHLORIDE 0.9 % IV SOLN
500.0000 mg | INTRAVENOUS | Status: DC
  Administered 2023-03-23 – 2023-03-24 (×2): 500 mg via INTRAVENOUS
  Filled 2023-03-22 (×3): qty 5

## 2023-03-22 MED ORDER — SODIUM CHLORIDE 0.9 % IV SOLN
1.0000 g | Freq: Once | INTRAVENOUS | Status: AC
  Administered 2023-03-22: 1 g via INTRAVENOUS
  Filled 2023-03-22: qty 10

## 2023-03-22 MED ORDER — ISOSORBIDE MONONITRATE ER 30 MG PO TB24
30.0000 mg | ORAL_TABLET | Freq: Every day | ORAL | Status: DC
  Administered 2023-03-22 – 2023-03-24 (×3): 30 mg via ORAL
  Filled 2023-03-22 (×3): qty 1

## 2023-03-22 MED ORDER — PANTOPRAZOLE SODIUM 40 MG PO TBEC
40.0000 mg | DELAYED_RELEASE_TABLET | Freq: Every day | ORAL | Status: DC
  Administered 2023-03-22 – 2023-03-24 (×3): 40 mg via ORAL
  Filled 2023-03-22 (×3): qty 1

## 2023-03-22 NOTE — ED Provider Notes (Addendum)
[place] EMERGENCY DEPARTMENT AT [facility] Provider Note   CSN: [id number] Arrival date & time: 03/22/23  1430     History  Chief Complaint  Patient presents with   Shortness of Breath    Nancy [name] is a 62 y.o. female.   Shortness of Breath    Patient has a history of diabetes, hyperlipidemia, hypertension, peripheral vascular disease, chronic kidney disease on dialysis, status post bilateral below the knee amputations.  Patient presents to the ED with complaints of shortness of breath low blood pressure and chest discomfort.  Patient was receiving dialysis.  Patient states while she was there her blood pressure dropped and she became short of breath.  Patient only completed 1 hour and 20 minutes of her dialysis.  She was sent to the ED for evaluation.  Patient states her breathing difficulty has resolved but she did develop pain in her chest as well as into her back.  She is not currently having pain now.  Patient was given fluids as well as her normal supplemental oxygen.  Home Medications Prior to Admission medications   Medication Sig Start Date End Date Taking? Authorizing Provider  amLODipine (NORVASC) 10 MG tablet Take 1 tablet (10 mg total) by mouth daily. 06/04/22   Burnadette Pop, MD  aspirin 81 MG EC tablet Take 1 tablet (81 mg total) by mouth daily. 09/26/21   Milford, Anderson Malta, FNP  atorvastatin (LIPITOR) 40 MG tablet Take 1 tablet (40 mg total) by mouth daily. 11/07/21   Laurey Morale, MD  Blood Glucose Monitoring Suppl [facility] VERIO) w/Device KIT Check blood sugar three times daily. E11.40 05/06/20   Marcine Matar, MD  Continuous Blood Gluc Receiver (DEXCOM G6 RECEIVER) DEVI 1 Device by Does not apply route daily. 11/15/21   Marcine Matar, MD  Continuous Blood Gluc Sensor (DEXCOM G6 SENSOR) MISC 1 packet by Does not apply route daily. 11/15/21   Marcine Matar, MD  Continuous Blood Gluc Transmit (DEXCOM G6 TRANSMITTER) MISC 1  packet by Does not apply route daily. 11/15/21   Marcine Matar, MD  DM-Doxylamine-Acetaminophen (NYQUIL HBP COLD & FLU) 15-6.25-325 MG/15ML LIQD Take 30 mLs by mouth every 8 (eight) hours as needed (Cold).    [provider]  ezetimibe (ZETIA) 10 MG tablet Take 1 tablet (10 mg total) by mouth daily. 04/11/22   Danford, Earl Lites, MD  ferrous sulfate 325 (65 FE) MG tablet Take 1 tablet (325 mg total) by mouth daily with breakfast. 11/07/21   Marcine Matar, MD  glucose blood test strip Use as instructedCheck blood sugar three times daily. E11.40 07/11/20   Marcine Matar, MD  HYDROmorphone (DILAUDID) 2 MG tablet Take 0.5 tablets (1 mg total) by mouth every 6 (six) hours as needed for severe pain (pain score 7-10). 11/22/22   Zannie Cove, MD  insulin glargine (LANTUS) 100 UNIT/ML Solostar Pen Inject 10 Units into the skin at bedtime. 04/10/22   Danford, Earl Lites, MD  Insulin Pen Needle 32G X 4 MM MISC Use to inject insulin as directed. 11/07/21   Marcine Matar, MD  isosorbide mononitrate (IMDUR) 30 MG 24 hr tablet Take 1 tablet (30 mg total) by mouth daily. 06/04/22   Burnadette Pop, MD  isosorbide mononitrate (IMDUR) 60 MG 24 hr tablet Take 1 tablet (60 mg total) by mouth daily. 11/23/22   Zannie Cove, MD  labetalol (NORMODYNE) 300 MG tablet Take 1 tablet (300 mg total) by mouth 2 (two)  times daily. 06/04/22   Burnadette Pop, MD  multivitamin (RENA-VIT) TABS tablet Take 1 tablet by mouth at bedtime. Patient taking differently: Take 1 tablet by mouth daily. 04/10/22   Danford, Earl Lites, MD  NOVOLOG FLEXPEN 100 UNIT/ML FlexPen Inject 3 Units into the skin in the morning and at bedtime. 06/04/22   [provider]  ondansetron (ZOFRAN) 8 MG tablet Take 1 tablet (8 mg total) by mouth every 8 (eight) hours as needed for nausea 05/23/22     pantoprazole (PROTONIX) 40 MG tablet Take 1 tablet (40 mg total) by mouth daily. 04/11/22   Danford, Earl Lites, MD   polyethylene glycol (MIRALAX / GLYCOLAX) 17 g packet Take 17 g by mouth daily. 06/04/22   Burnadette Pop, MD  promethazine (PHENERGAN) 25 MG tablet Take 25 mg by mouth every 8 (eight) hours as needed for vomiting or nausea. 04/23/22   [provider]  senna-docusate (SENOKOT-S) 8.6-50 MG tablet Take 1 tablet by mouth at bedtime. 11/22/22   Zannie Cove, MD  sevelamer (RENAGEL) 800 MG tablet Take 1,600 mg by mouth 3 (three) times daily. 07/06/22   [provider]  zinc sulfate 220 (50 Zn) MG capsule Take 1 capsule (220 mg total) by mouth daily. 04/11/22   Danford, Earl Lites, MD  Insulin NPH Isophane & Regular (RELION 70/30 ) Inject 35 Units into the skin 2 (two) times daily.  08/17/14  [provider]      Allergies    Hydralazine, Hydralazine hcl, Hydrocodone, Metformin and related, Other, Plaquenil [hydroxychloroquine sulfate], Shellfish allergy, Shellfish-derived products, Shrimp (diagnostic), and Sulfa antibiotics    Review of Systems   Review of Systems  Respiratory:  Positive for shortness of breath.     Physical Exam Updated Vital Signs BP (!) 165/76   Pulse 78   Resp 13   SpO2 100%  Physical Exam Vitals and nursing note reviewed.  Constitutional:      Appearance: She is well-developed. She is not diaphoretic.  HENT:     Head: Normocephalic and atraumatic.     Right Ear: External ear normal.     Left Ear: External ear normal.  Eyes:     General: No scleral icterus.       Right eye: No discharge.        Left eye: No discharge.     Conjunctiva/sclera: Conjunctivae normal.  Neck:     Trachea: No tracheal deviation.  Cardiovascular:     Rate and Rhythm: Normal rate and regular rhythm.  Pulmonary:     Effort: Pulmonary effort is normal. No respiratory distress.     Breath sounds: Normal breath sounds. No stridor. No wheezing or rales.  Abdominal:     General: Bowel sounds are normal. There is no distension.     Palpations: Abdomen is soft.      Tenderness: There is no abdominal tenderness. There is no guarding or rebound.  Musculoskeletal:        General: No tenderness or deformity.     Cervical back: Neck supple.  Skin:    General: Skin is warm and dry.     Findings: No rash.  Neurological:     General: No focal deficit present.     Mental Status: She is alert.     Cranial Nerves: No cranial nerve deficit, dysarthria or facial asymmetry.     Sensory: No sensory deficit.     Motor: No abnormal muscle tone or seizure activity.     Coordination: Coordination  normal.  Psychiatric:        Mood and Affect: Mood normal.     ED Results / Procedures / Treatments   Labs (all labs ordered are listed, but only abnormal results are displayed) Labs Reviewed  BASIC METABOLIC PANEL - Abnormal; Notable for the following components:      Result Value   Potassium 5.6 (*)    Chloride 96 (*)    Glucose, Bld 102 (*)    BUN 35 (*)    Creatinine, Ser 6.23 (*)    Calcium 8.2 (*)    GFR, Estimated 7 (*)    All other components within normal limits  CBC - Abnormal; Notable for the following components:   WBC 17.9 (*)    Hemoglobin 11.8 (*)    All other components within normal limits  CBG MONITORING, ED - Abnormal; Notable for the following components:   Glucose-Capillary 112 (*)    All other components within normal limits  SARS CORONAVIRUS 2 BY RT PCR  TROPONIN I (HIGH SENSITIVITY)  TROPONIN I (HIGH SENSITIVITY)    EKG EKG Interpretation  Date/Time:  Friday March 22 2023 15:03:37 EDT Ventricular Rate:  76 PR Interval:  161 QRS Duration: 85 QT Interval:  416 QTC Calculation: 468 R Axis:   -13 Text Interpretation: Sinus rhythm Borderline T wave abnormalities No significant change since last tracing Confirmed by Linwood Dibbles [phone]) on 03/22/2023 3:28:57 PM  Radiology DG Chest 2 View  Result Date: 03/22/2023 CLINICAL DATA:  Chest pain EXAM: CHEST - 2 VIEW COMPARISON:  CXR 02/06/23 FINDINGS: No pleural effusion. No  pneumothorax. Unchanged cardiac and mediastinal contours. No focal airspace opacity. No radiographically apparent displaced rib fractures. Prominent bilateral interstitial opacities are nonspecific and could represent pulmonary venous congestion or atypical infection. Visualized upper abdomen is unremarkable. Vertebral body heights are maintained. IMPRESSION: Prominent bilateral interstitial opacities are nonspecific and could represent pulmonary venous congestion or atypical infection. No focal airspace opacity. Electronically Signed   By: Lorenza Cambridge M.D.   On: 03/22/2023 15:34    Procedures Procedures    Medications Ordered in ED Medications  cefTRIAXone (ROCEPHIN) 1 g in sodium chloride 0.9 % 100 mL IVPB (has no administration in time range)  azithromycin (ZITHROMAX) tablet 500 mg (has no administration in time range)    ED Course/ Medical Decision Making/ A&P Clinical Course as of 03/22/23 1836  Fri Mar 22, 2023  1541 Chest x-ray shows prominent interstitial opacities. [JK]  1736 CBC(!) Wbc elevated [JK]  1743 Troponin I (High Sensitivity)  troponin normal. [JK]  1743 Basic metabolic panel(!) Potassium slightly increased at 5.6. [JK]  1835 Case discussed with Dr Cyndia Bent [JK]    Clinical Course User Index [JK] Linwood Dibbles, MD                             Medical Decision Making Differential diagnosis includes but not limited to pneumonia, CHF, acute coronary syndrome  Problems Addressed: Chronic renal failure, unspecified CKD stage: chronic illness or injury with exacerbation, progression, or side effects of treatment Pneumonia due to infectious organism, unspecified laterality, unspecified part of lung: acute illness or injury that poses a threat to life or bodily functions  Amount and/or Complexity of Data Reviewed Labs: ordered. Decision-making details documented in ED Course. Radiology: ordered and independent interpretation performed.  Risk Prescription drug  management. Decision regarding hospitalization.   Patient presented to ED with complaints of chest pain.  Patient has  history of multiple medical problems.  She was at dialysis today and they had to cut it short because of her shortness of breath and chest pain.  Patient denies any respiratory symptoms.  Workup does show leukocytosis and x-ray suggestive of possible vascular congestion versus pneumonia.  It is not clear if this is infectious in nature as her symptoms are atypical.  Leukocytosis could possibly suggest infection as a source of her symptoms.  Will start the patient on antibiotics in the meantime.  Troponin so far is reassuring.  Will check delta.  Patient was not able to complete her dialysis.  With her risk factors I think it is reasonable start a course of IV antibiotics and monitor overnight.  Patient may need repeat dialysis considering the possibility of vascular congestion that still noted and her incomplete dialysis session today.        Final Clinical Impression(s) / ED Diagnoses Final diagnoses:  Chronic renal failure, unspecified CKD stage  Pneumonia due to infectious organism, unspecified laterality, unspecified part of lung    Rx / DC Orders ED Discharge Orders     None         Linwood DibblesKnapp, Crawford Tamura, MD 03/22/23 1836 Case discussed with nephrology.  Dr Juel BurrowLin.  WIll see pt in the AM   Linwood DibblesKnapp, Caeli Linehan, MD 03/22/23 2118

## 2023-03-22 NOTE — H&P (Signed)
History and Physical    Patient: Nancy [name] XBJ:[id number] DOB: 11-16-1961 DOA: 03/22/2023 DOS: the patient was seen and examined on 03/22/2023 PCP: Marcine MatarJohnson, Deborah B, MD  Patient coming from: Home  Chief Complaint:  Chief Complaint  Patient presents with   Shortness of Breath   HPI: Nancy LandmarkVeronica H [name] is a 62 y.o. female with medical history significant of ESRD on hemodialysis MWF, hypertension, chronic diastolic CHF, chronic hypoxic respiratory failure on 3 to 4 L O2, hyperlipidemia, coronary artery disease, type 2 diabetes,  PVD status post bilateral BKA, resident of SNF presents with shortness of breath, low blood pressure.  Pt completed about an hour and 20 minutes of her dialysis.  She was then told that her blood pressure was low and then began to felt acute shortness of breath.  She has been evaluated in the ED about once a month for similar symptoms of dyspnea while on dialysis.  She reports feeling nauseous almost every morning.  Denies any new cough or runny nose but has felt hot and diaphoretic.  Has been constipated with last bowel movement this morning.  Has not been taking her MiraLAX daily.  In the ED, she was afebrile hypertensive with blood pressure up to 170/75 on room air.  Had leukocytosis of 17.9 K, hemoglobin of 1.8.  Sodium of 138, hyperkalemia 5.6, creatinine stable at 6.23.  Troponin reassuring at 9.  EKG on my review with sinus rhythm.  Chest x-ray showing some mild bilateral interstitial opacity.  COVID PCR is negative.  Patient was initiated on IV Rocephin and azithromycin for presumed pneumonia and hospitalist was consulted for admission. Review of Systems: As mentioned in the history of present illness. All other systems reviewed and are negative. Past Medical History:  Diagnosis Date   Anemia 2006   Anginal pain    Carotid artery disease    right ICA occlusion, 40-59% LICA 02/2021 US   CHF (congestive heart failure)    Coronary artery disease     Depression 2014   previously on amitryptiline    Diabetes mellitus with neurological manifestation 2006   Diabetic peripheral neuropathy 04/26/2020   Dysphagia    Dyspnea    ESRD on hemodialysis    Fracture of left ankle 1997   Gastroparesis 07/2016   Heart murmur    HOH (hard of hearing) 2004   Hyperlipidemia 2006   Hypertension 2006   IBS (irritable bowel syndrome) 2002   Leukopenia 2015   Macular degeneration 11/2019   Peripheral vascular disease    Shingles 2009   Stroke 2020   Thyroid nodule 2004   Past Surgical History:  Procedure Laterality Date   A/V FISTULAGRAM Right 09/25/2022   Procedure: A/V Fistulagram;  Surgeon: Nada LibmanBrabham, Vance W, MD;  Location: [facility];  Service: Cardiovascular;  Laterality: Right;   ABDOMINAL HYSTERECTOMY  2005   AMPUTATION Bilateral 04/04/2022   Procedure: BILATERAL BELOW KNEE AMPUTATION;  Surgeon: Nadara Mustarduda, Marcus V, MD;  [facility];  Service: Orthopedics;  Laterality: Bilateral;   AV FISTULA PLACEMENT Right 12/22/2021   Procedure: RIGHT ARM ARTERIOVENOUS (AV) BRACIOCPHELAIC FISTULA CREATION;  Surgeon: Maeola Harmanain, Brandon Christopher, MD;  [facility];  Service: Vascular;  Laterality: Right;   AV FISTULA PLACEMENT Right 02/19/2022   Procedure: RIGHT ARTERIOVENOUS FISTULA;  Surgeon: Chuck Hintickson, Christopher S, MD;  [facility];  Service: Vascular;  Laterality: Right;   BASCILIC VEIN TRANSPOSITION Right 08/07/2022   Procedure: SECOND STAGE RIGHT BASILIC VEIN TRANSPOSITION;  Surgeon: Waverly Ferrariickson, Christopher  S, MD;  Location: [facility] OR;  Service: Vascular;  Laterality: Right;   CATARACT EXTRACTION Left 11/2019   CESAREAN SECTION  1983    CHOLECYSTECTOMY N/A 03/05/2020   Procedure: LAPAROSCOPIC CHOLECYSTECTOMY WITH INTRAOPERATIVE CHOLANGIOGRAM;  Surgeon: Manus Rudd, MD;  Location: [facility] ORS;  Service: General;  Laterality: N/A;   ESOPHAGOGASTRODUODENOSCOPY (EGD) WITH PROPOFOL Left 08/26/2014   Procedure: ESOPHAGOGASTRODUODENOSCOPY (EGD) WITH  PROPOFOL;  Surgeon: Willis Modena, MD;  Location: [facility];  Service: Endoscopy;  Laterality: Left;   ESOPHAGOGASTRODUODENOSCOPY (EGD) WITH PROPOFOL N/A 03/03/2020   Procedure: ESOPHAGOGASTRODUODENOSCOPY (EGD) WITH PROPOFOL;  Surgeon: Beverley Fiedler, MD;  Location: [facility];  Service: Gastroenterology;  Laterality: N/A;   IR FLUORO GUIDE CV LINE RIGHT  02/15/2022   IR US GUIDE VASC ACCESS RIGHT  02/15/2022   PERIPHERAL VASCULAR BALLOON ANGIOPLASTY Right 09/25/2022   Procedure: PERIPHERAL VASCULAR BALLOON ANGIOPLASTY;  Surgeon: Nada Libman, MD;  Location: [facility];  Service: Cardiovascular;  Laterality: Right;  AVF   RIGHT HEART CATH N/A 07/14/2021   Procedure: RIGHT HEART CATH;  Surgeon: Laurey Morale, MD;  Location: [facility] INV[facility]IVE CV LAB;  Service: Cardiovascular;  Laterality: N/A;   RIGHT/LEFT HEART CATH AND CORONARY ANGIOGRAPHY N/A 04/27/2021   Procedure: RIGHT/LEFT HEART CATH AND CORONARY ANGIOGRAPHY;  Surgeon: Runell Gess, MD;  Location: [facility];  Service: Cardiovascular;  Laterality: N/A;   Social History:  reports that she has quit smoking. Her smoking use included cigarettes. She has a 0.13 pack-year smoking history. She has never been exposed to tobacco smoke. She has never used smokeless tobacco. She reports that she does not drink alcohol and does not use drugs.  Allergies  Allergen Reactions   Hydralazine Other (See Comments)   Hydralazine Hcl Other (See Comments)    Hair loss   Hydrocodone Itching and Other (See Comments)    Upset stomach   Metformin And Related Nausea And Vomiting and Other (See Comments)    Stomach pains, also   Other Nausea Only and Other (See Comments)    Lettuce- Does not digest this!!   Plaquenil [Hydroxychloroquine Sulfate] Hives   Shellfish Allergy Nausea And Vomiting   Shellfish-Derived Products Nausea Only and Other (See Comments)    Caused an upset stomach   Shrimp (Diagnostic) Nausea Only and Other (See Comments)     Upset stomach    Sulfa Antibiotics Hives    Family History  Problem Relation Age of Onset   Hypertension Mother    Heart disease Mother    Diabetes Mother    Thyroid disease Mother    Congestive Heart Failure Mother    Breast cancer Maternal Grandmother    Colon cancer Maternal Grandfather    Heart attack Sister    Heart disease Brother    Hyperlipidemia Brother    Hypertension Brother    Diabetes Father    Breast cancer Maternal Aunt     Prior to Admission medications   Medication Sig Start Date End Date Taking? Authorizing Provider  amLODipine (NORVASC) 10 MG tablet Take 1 tablet (10 mg total) by mouth daily. 06/04/22   Burnadette Pop, MD  aspirin 81 MG EC tablet Take 1 tablet (81 mg total) by mouth daily. 09/26/21   Milford, Anderson Malta, FNP  atorvastatin (LIPITOR) 40 MG tablet Take 1 tablet (40 mg total) by mouth daily. 11/07/21   Laurey Morale, MD  Blood Glucose Monitoring Suppl [facility] VERIO) w/Device KIT Check blood sugar three times daily. E11.40 05/06/20  Marcine Matar, MD  Continuous Blood Gluc Receiver (DEXCOM G6 RECEIVER) DEVI 1 Device by Does not apply route daily. 11/15/21   Marcine Matar, MD  Continuous Blood Gluc Sensor (DEXCOM G6 SENSOR) MISC 1 packet by Does not apply route daily. 11/15/21   Marcine Matar, MD  Continuous Blood Gluc Transmit (DEXCOM G6 TRANSMITTER) MISC 1 packet by Does not apply route daily. 11/15/21   Marcine Matar, MD  DM-Doxylamine-Acetaminophen (NYQUIL HBP COLD & FLU) 15-6.25-325 MG/15ML LIQD Take 30 mLs by mouth every 8 (eight) hours as needed (Cold).    [provider]  ezetimibe (ZETIA) 10 MG tablet Take 1 tablet (10 mg total) by mouth daily. 04/11/22   Danford, Earl Lites, MD  ferrous sulfate 325 (65 FE) MG tablet Take 1 tablet (325 mg total) by mouth daily with breakfast. 11/07/21   Marcine Matar, MD  glucose blood test strip Use as instructedCheck blood sugar three times daily. E11.40 07/11/20    Marcine Matar, MD  HYDROmorphone (DILAUDID) 2 MG tablet Take 0.5 tablets (1 mg total) by mouth every 6 (six) hours as needed for severe pain (pain score 7-10). 11/22/22   Zannie Cove, MD  insulin glargine (LANTUS) 100 UNIT/ML Solostar Pen Inject 10 Units into the skin at bedtime. 04/10/22   Danford, Earl Lites, MD  Insulin Pen Needle 32G X 4 MM MISC Use to inject insulin as directed. 11/07/21   Marcine Matar, MD  isosorbide mononitrate (IMDUR) 30 MG 24 hr tablet Take 1 tablet (30 mg total) by mouth daily. 06/04/22   Burnadette Pop, MD  isosorbide mononitrate (IMDUR) 60 MG 24 hr tablet Take 1 tablet (60 mg total) by mouth daily. 11/23/22   Zannie Cove, MD  labetalol (NORMODYNE) 300 MG tablet Take 1 tablet (300 mg total) by mouth 2 (two) times daily. 06/04/22   Burnadette Pop, MD  multivitamin (RENA-VIT) TABS tablet Take 1 tablet by mouth at bedtime. Patient taking differently: Take 1 tablet by mouth daily. 04/10/22   Danford, Earl Lites, MD  NOVOLOG FLEXPEN 100 UNIT/ML FlexPen Inject 3 Units into the skin in the morning and at bedtime. 06/04/22   [provider]  ondansetron (ZOFRAN) 8 MG tablet Take 1 tablet (8 mg total) by mouth every 8 (eight) hours as needed for nausea 05/23/22     pantoprazole (PROTONIX) 40 MG tablet Take 1 tablet (40 mg total) by mouth daily. 04/11/22   Danford, Earl Lites, MD  polyethylene glycol (MIRALAX / GLYCOLAX) 17 g packet Take 17 g by mouth daily. 06/04/22   Burnadette Pop, MD  promethazine (PHENERGAN) 25 MG tablet Take 25 mg by mouth every 8 (eight) hours as needed for vomiting or nausea. 04/23/22   [provider]  senna-docusate (SENOKOT-S) 8.6-50 MG tablet Take 1 tablet by mouth at bedtime. 11/22/22   Zannie Cove, MD  sevelamer (RENAGEL) 800 MG tablet Take 1,600 mg by mouth 3 (three) times daily. 07/06/22   [provider]  zinc sulfate 220 (50 Zn) MG capsule Take 1 capsule (220 mg total) by mouth daily. 04/11/22    Danford, Earl Lites, MD  Insulin NPH Isophane & Regular (RELION 70/30 [employer]) Inject 35 Units into the skin 2 (two) times daily.  08/17/14  [provider]    Physical Exam: Vitals:   03/22/23 2000 03/22/23 2030 03/22/23 2045 03/22/23 2115  BP: (!) 173/76 (!) 176/75 (!) 144/76 121/71  Pulse: 80 80 79 79  Resp: Temp:  TempSrc:      SpO2: 100% 99% 99% 100%   Constitutional: NAD, calm, comfortable, mildly ill-appearing diaphoretic obese female sitting upright in bed Eyes: lids and conjunctivae normal ENMT: Mucous membranes are moist.  Neck: normal, supple Respiratory: clear to auscultation bilaterally, no wheezing, no crackles. Normal respiratory effort. No accessory muscle use.  Cardiovascular: Regular rate and rhythm, no murmurs / rubs / gallops. No extremity edema. Left UE fistula. Abdomen: no tenderness,  Bowel sounds positive.  Musculoskeletal: no clubbing / cyanosis. Bilateral BKA with prosthesis. Skin: no rashes, lesions, ulcers.  Neurologic: CN 2-12 grossly intact.  Psychiatric: Normal judgment and insight. Alert and oriented x 3. Normal mood. Data Reviewed:  See HPI  Assessment and Plan: * Community acquired pneumonia - Patient presents with dyspnea following dialysis which is a frequent symptom for her.  However she has elevated WBC today with chest x-ray showing mild bilateral opacity concerning for pneumonia.  She does appears somewhat ill-appearing on exam. -Reportedly she is at baseline 3 to 4 L however she is stable here on room air -Will continue IV Rocephin and azithromycin. -will obtain blood culture. She was already given a dose of antibiotics in ED.   ESRD on dialysis -HD MWF. Reports nephrology has plans to potentially add an additional day. -She was only able to complete about an hour and a half for dialysis today.  Will need to consult nephrology in the morning to likely receive an additional unscheduled dialysis tomorrow.  Chronic  diastolic CHF (congestive heart failure) - Appears euvolemic on exam  Insulin dependent type 2 diabetes mellitus - A1c of 4.9 in December -Monitor without any sliding scale insulin at this time  History of CVA (cerebrovascular accident) Continue aspirin, statin and Zetia  Hyperkalemia -Presenting potassium of 5.6.  Administer Lokelma x 1  HTN (hypertension) - Reportedly was hypotensive at dialysis center but unclear how low.  Blood pressure actually uncontrolled here with SBP up to 170. -Continue home amlodipine, labetalol, Imdur      Advance Care Planning: Full  Consults: needs nephrology consult in the morning  Family Communication: none at bedside  Severity of Illness: The appropriate patient status for this patient is OBSERVATION. Observation status is judged to be reasonable and necessary in order to provide the required intensity of service to ensure the patient's safety. The patient's presenting symptoms, physical exam findings, and initial radiographic and laboratory data in the context of their medical condition is felt to place them at decreased risk for further clinical deterioration. Furthermore, it is anticipated that the patient will be medically stable for discharge from the hospital within 2 midnights of admission.   Author: Anselm Jungling, DO 03/22/2023 9:26 PM  For on call review [email or web address].

## 2023-03-22 NOTE — ED Triage Notes (Signed)
Pt came in via POV from dialysis d/t initial SOB that has since resolved while en route to ED. Was only able to complete 1hr 20 min of her Tx. NSR on their monitor, lung sounds good, denies pain, on 2L O2 via n/c at all times & was 100%, SBP 140, A/Ox4, received 900 cc NS in 20g Lt hand.

## 2023-03-22 NOTE — Assessment & Plan Note (Addendum)
-   Patient presents with dyspnea following dialysis which is a frequent symptom for her.  However she has elevated WBC today with chest x-ray showing mild bilateral opacity concerning for pneumonia.  She does appears somewhat ill-appearing on exam. -Reportedly she is at baseline 3 to 4 L however she is stable here on room air -Will continue IV Rocephin and azithromycin. -will obtain blood culture. She was already given a dose of antibiotics in ED.

## 2023-03-22 NOTE — Assessment & Plan Note (Signed)
Continue aspirin, statin and Zetia

## 2023-03-22 NOTE — ED Notes (Signed)
Patient transported to X-ray 

## 2023-03-22 NOTE — ED Notes (Signed)
Report given to Devonne Doughty, RN of [phone]

## 2023-03-22 NOTE — Assessment & Plan Note (Signed)
-  HD MWF. Reports nephrology has plans to potentially add an additional day. -She was only able to complete about an hour and a half for dialysis today.  Will need to consult nephrology in the morning to likely receive an additional unscheduled dialysis tomorrow.

## 2023-03-22 NOTE — Assessment & Plan Note (Signed)
-   Reportedly was hypotensive at dialysis center but unclear how low.  Blood pressure actually uncontrolled here with SBP up to 170. -Continue home amlodipine, labetalol, Imdur

## 2023-03-22 NOTE — Assessment & Plan Note (Signed)
-  Presenting potassium of 5.6.  Administer Lokelma x 1

## 2023-03-22 NOTE — Assessment & Plan Note (Signed)
-   A1c of 4.9 in December -Monitor without any sliding scale insulin at this time

## 2023-03-22 NOTE — Assessment & Plan Note (Signed)
Appears euvolemic on exam. ?

## 2023-03-23 DIAGNOSIS — N186 End stage renal disease: Secondary | ICD-10-CM | POA: Diagnosis not present

## 2023-03-23 DIAGNOSIS — J189 Pneumonia, unspecified organism: Secondary | ICD-10-CM

## 2023-03-23 DIAGNOSIS — Z1152 Encounter for screening for COVID-19: Secondary | ICD-10-CM | POA: Diagnosis not present

## 2023-03-23 DIAGNOSIS — D631 Anemia in chronic kidney disease: Secondary | ICD-10-CM | POA: Diagnosis not present

## 2023-03-23 LAB — BASIC METABOLIC PANEL
Anion gap: 10 (ref 5–15)
BUN: 42 mg/dL — ABNORMAL HIGH (ref 8–23)
CO2: 28 mmol/L (ref 22–32)
Calcium: 8.1 mg/dL — ABNORMAL LOW (ref 8.9–10.3)
Chloride: 100 mmol/L (ref 98–111)
Creatinine, Ser: 6.8 mg/dL — ABNORMAL HIGH (ref 0.44–1.00)
GFR, Estimated: 6 mL/min — ABNORMAL LOW (ref >60)
Glucose, Bld: 90 mg/dL (ref 70–99)
Potassium: 6 mmol/L — ABNORMAL HIGH (ref 3.5–5.1)
Sodium: 138 mmol/L (ref 135–145)

## 2023-03-23 LAB — CBC
HCT: 34.9 % — ABNORMAL LOW (ref 36.0–46.0)
Hemoglobin: 10.8 g/dL — ABNORMAL LOW (ref 12.0–15.0)
MCH: 29.5 pg (ref 26.0–34.0)
MCHC: 30.9 g/dL (ref 30.0–36.0)
MCV: 95.4 fL (ref 80.0–100.0)
Platelets: 216 10*3/uL (ref 150–400)
RBC: 3.66 MIL/uL — ABNORMAL LOW (ref 3.87–5.11)
RDW: 15.8 % — ABNORMAL HIGH (ref 11.5–15.5)
WBC: 13 10*3/uL — ABNORMAL HIGH (ref 4.0–10.5)
nRBC: 0 % (ref 0.0–0.2)

## 2023-03-23 LAB — HEPATITIS B SURFACE ANTIGEN: Hepatitis B Surface Ag: NONREACTIVE

## 2023-03-23 LAB — CULTURE, BLOOD (ROUTINE X 2)

## 2023-03-23 MED ORDER — PENTAFLUOROPROP-TETRAFLUOROETH EX AERO: 1.0000 | INHALATION_SPRAY | CUTANEOUS | Status: DC | PRN

## 2023-03-23 MED ORDER — LIDOCAINE 5 % EX PTCH
2.0000 | MEDICATED_PATCH | CUTANEOUS | Status: DC
  Administered 2023-03-23 – 2023-03-24 (×2): 2 via TRANSDERMAL
  Filled 2023-03-23 (×2): qty 2

## 2023-03-23 MED ORDER — HYDROMORPHONE HCL 2 MG PO TABS
1.0000 mg | ORAL_TABLET | Freq: Four times a day (QID) | ORAL | Status: DC | PRN
  Administered 2023-03-23 – 2023-03-24 (×4): 1 mg via ORAL
  Filled 2023-03-23 (×4): qty 1

## 2023-03-23 MED ORDER — HEPARIN SODIUM (PORCINE) 1000 UNIT/ML DIALYSIS
5000.0000 [IU] | Freq: Once | INTRAMUSCULAR | Status: DC
  Filled 2023-03-23: qty 5

## 2023-03-23 MED ORDER — LIDOCAINE-PRILOCAINE 2.5-2.5 % EX CREA: 1.0000 | TOPICAL_CREAM | CUTANEOUS | Status: DC | PRN

## 2023-03-23 MED ORDER — DIPHENHYDRAMINE HCL 50 MG/ML IJ SOLN
INTRAMUSCULAR | Status: AC
  Filled 2023-03-23: qty 1

## 2023-03-23 MED ORDER — LIDOCAINE HCL (PF) 1 % IJ SOLN: 5.0000 mL | INTRAMUSCULAR | Status: DC | PRN

## 2023-03-23 MED ORDER — CHLORHEXIDINE GLUCONATE CLOTH 2 % EX PADS: 6.0000 | MEDICATED_PAD | Freq: Every day | CUTANEOUS | Status: DC

## 2023-03-23 MED ORDER — DIPHENHYDRAMINE HCL 50 MG/ML IJ SOLN
25.0000 mg | Freq: Once | INTRAMUSCULAR | Status: AC
  Administered 2023-03-23: 25 mg via INTRAVENOUS

## 2023-03-23 MED ORDER — SODIUM ZIRCONIUM CYCLOSILICATE 10 G PO PACK
10.0000 g | PACK | Freq: Once | ORAL | Status: AC
  Administered 2023-03-23: 10 g via ORAL
  Filled 2023-03-23: qty 1

## 2023-03-23 NOTE — Plan of Care (Signed)
  Problem: Clinical Measurements: Goal: Respiratory complications will improve Outcome: Progressing Goal: Cardiovascular complication will be avoided Outcome: Progressing   Problem: Nutrition: Goal: Adequate nutrition will be maintained Outcome: Progressing   Problem: Pain Managment: Goal: General experience of comfort will improve Outcome: Progressing   

## 2023-03-23 NOTE — Consult Note (Signed)
Reason for Consult: ESRD Referring Physician:  Dr. Benita Gutter  Chief Complaint: Shortness of breath  OP HD: [facility] MWF 4h  300/1.5   92kg  2/2 bath  RUE AVF  Hep 5000+ - rocaltrol 0.50 po tiw - venofer 50 q wk - mircera 100 mcg IV q 2 wks, last 4/3  Assessment/Plan: ESRD MWF [facility] with multiple shortened treatments and last outpt treatments past week she has left at 97.4, 97.4 and 99.3 kg with listed EDW of 92 kg. Last time she has reached 92 was 1/31; more recently the lowest she's gotten is 93.5kg.  - Plan on HD today and then MWF regimen. SOB / resp distress / pulm edema /vol overload -  plan on extra treatment today given shortened treatments this past week. HTN/ vol - restart home regimen. PAD - sp bilat BKA. In SNF MBD ckd - CCa in range, will check phos for binder management. Cont renvela for now +  vdra.  Anemia esrd - Hb 10.8 last ESA given 4/3. Plans for SNF at discharge - she came from SNF.   D/c today it appears.   HPI: Nancy [name] is an 62 y.o. female HTN, HFpEF, O2 dependent, HLD, CASHD, DM, PAD s/p b/l BKA's, resident of SNF ESRD MWF at [facility] with Dr. Marisue Humble with 2 shortened treatments now presenting with shortness of breath  and hypotension. Patient has had to come to the ED for similar complaints often. She denies any fever, chills, cough, chest pain, myalgias. She was noted in the ED to be hypertensive and good saturations on RA with K 5.6 and CXR showing interstitial opacities. Patient was started on Rocephin and Zithro for CAP.   Patient ws only on for 3.5hrs on Wednesday and  1.5-2hrs on Friday. ROS Pertinent items are noted in HPI.  Chemistry and CBC: Creat  Date/Time Value Ref Range Status  08/17/2014 04:41 PM 0.73 0.50 - 1.10 mg/dL Final   Creatinine, Ser  Date/Time Value Ref Range Status  03/23/2023 01:27 AM 6.80 (H) 0.44 - 1.00 mg/dL Final  16/09/9603 54:09 PM 6.23 (H) 0.44 - 1.00 mg/dL Final  81/19/1478 29:56 AM 7.39 (H) 0.44 - 1.00  mg/dL Final  21/30/8657 84:69 AM 5.10 (H) 0.44 - 1.00 mg/dL Final  62/95/2841 32:44 AM 4.72 (H) 0.44 - 1.00 mg/dL Final  12/19/7251 66:44 AM 5.57 (H) 0.44 - 1.00 mg/dL Final  03/47/4259 56:38 AM 4.31 (H) 0.44 - 1.00 mg/dL Final  75/64/3329 51:88 PM 4.04 (H) 0.44 - 1.00 mg/dL Final  41/66/0630 16:01 AM 3.33 (H) 0.44 - 1.00 mg/dL Final  09/32/3557 32:20 AM 4.69 (H) 0.44 - 1.00 mg/dL Final  25/42/7062 37:62 AM 4.66 (H) 0.44 - 1.00 mg/dL Final  83/15/1761 60:73 AM 6.89 (H) 0.44 - 1.00 mg/dL Final  71/05/2693 85:46 AM 6.87 (H) 0.44 - 1.00 mg/dL Final  27/02/5008 38:18 AM 4.50 (H) 0.44 - 1.00 mg/dL Final  29/93/7169 67:89 AM 3.40 (H) 0.44 - 1.00 mg/dL Final  38/09/1750 02:58 AM 3.34 (H) 0.44 - 1.00 mg/dL Final  52/77/8242 35:36 AM 2.58 (H) 0.44 - 1.00 mg/dL Final  14/43/1540 08:67 PM 2.42 (H) 0.44 - 1.00 mg/dL Final  61/95/0932 67:12 AM 5.20 (H) 0.44 - 1.00 mg/dL Final  45/80/9983 38:25 AM 5.62 (H) 0.44 - 1.00 mg/dL Final  05/39/7673 41:93 AM 4.54 (H) 0.44 - 1.00 mg/dL Final    Comment:    DELTA CHECK NOTED  04/04/2022 01:36 AM 2.95 (H) 0.44 - 1.00 mg/dL Final  79/01/4096 35:32  AM 3.88 (H) 0.44 - 1.00 mg/dL Final  16/09/9603 54:09 AM 3.22 (H) 0.44 - 1.00 mg/dL Final  81/19/1478 29:56 AM 4.15 (H) 0.44 - 1.00 mg/dL Final  21/30/8657 84:69 PM 3.95 (H) 0.44 - 1.00 mg/dL Final  62/95/2841 32:44 AM 4.60 (H) 0.44 - 1.00 mg/dL Final  12/19/7251 66:44 PM 5.54 (H) 0.44 - 1.00 mg/dL Final  03/47/4259 56:38 AM 5.20 (H) 0.44 - 1.00 mg/dL Final  75/64/3329 51:88 AM 5.03 (H) 0.44 - 1.00 mg/dL Final  41/66/0630 16:01 PM 6.17 (H) 0.44 - 1.00 mg/dL Final  09/32/3557 32:20 AM 7.30 (H) 0.44 - 1.00 mg/dL Final  25/42/7062 37:62 PM 6.20 (H) 0.44 - 1.00 mg/dL Final  83/15/1761 60:73 PM 6.03 (H) 0.44 - 1.00 mg/dL Final  71/05/2693 85:46 AM 5.97 (H) 0.44 - 1.00 mg/dL Final  27/02/5008 38:18 PM 5.59 (H) 0.44 - 1.00 mg/dL Final  29/93/7169 67:89 PM 5.31 (H) 0.44 - 1.00 mg/dL Final  38/09/1750 02:58 PM 4.76 (H)  0.44 - 1.00 mg/dL Final  52/77/8242 35:36 PM 4.27 (H) 0.44 - 1.00 mg/dL Final  14/43/1540 08:67 AM 4.42 (H) 0.44 - 1.00 mg/dL Final  61/95/0932 67:12 AM 4.21 (H) 0.44 - 1.00 mg/dL Final  45/80/9983 38:25 AM 4.20 (H) 0.44 - 1.00 mg/dL Final  05/39/7673 41:93 PM 4.01 (H) 0.44 - 1.00 mg/dL Final  79/01/4096 35:32 AM 3.52 (H) 0.44 - 1.00 mg/dL Final  99/24/2683 41:96 AM 3.54 (H) 0.44 - 1.00 mg/dL Final  22/29/7989 21:19 AM 3.43 (H) 0.44 - 1.00 mg/dL Final  41/74/0814 48:18 AM 3.45 (H) 0.44 - 1.00 mg/dL Final  56/31/4970 26:37 AM 3.31 (H) 0.44 - 1.00 mg/dL Final  85/88/5027 74:12 AM 3.30 (H) 0.44 - 1.00 mg/dL Final  87/86/7672 09:47 AM 3.41 (H) 0.44 - 1.00 mg/dL Final  09/62/8366 29:47 AM 3.08 (H) 0.44 - 1.00 mg/dL Final  65/46/5035 46:56 AM 3.06 (H) 0.44 - 1.00 mg/dL Final   Recent Labs  Lab 03/22/23 1601 03/23/23 0127  NA 138 138  K 5.6* 6.0*  CL 96* 100  CO2 28 28  GLUCOSE 102* 90  BUN 35* 42*  CREATININE 6.23* 6.80*  CALCIUM 8.2* 8.1*   Recent Labs  Lab 03/22/23 1601 03/23/23 0127  WBC 17.9* 13.0*  HGB 11.8* 10.8*  HCT 39.0 34.9*  MCV 97.0 95.4  PLT 230 216   Liver Function Tests: No results for input(s): "AST", "ALT", "ALKPHOS", "BILITOT", "PROT", "ALBUMIN" in the last 168 hours. No results for input(s): "LIPASE", "AMYLASE" in the last 168 hours. No results for input(s): "AMMONIA" in the last 168 hours. Cardiac Enzymes: No results for input(s): "CKTOTAL", "CKMB", "CKMBINDEX", "TROPONINI" in the last 168 hours. Iron Studies: No results for input(s): "IRON", "TIBC", "TRANSFERRIN", "FERRITIN" in the last 72 hours. PT/INR: @LABRCNTIP (inr:5)  Xrays/Other Studies: ) Results for orders placed or performed during the hospital encounter of 03/22/23 (from the past 48 hour(s))  Basic metabolic panel     Status: Abnormal   Collection Time: 03/22/23  4:01 PM  Result Value Ref Range   Sodium 138 135 - 145 mmol/L   Potassium 5.6 (H) 3.5 - 5.1 mmol/L   Chloride 96 (L) 98 -  111 mmol/L   CO2 28 22 - 32 mmol/L   Glucose, Bld 102 (H) 70 - 99 mg/dL    Comment: Glucose reference range applies only to samples taken after fasting for at least 8 hours.   BUN 35 (H) 8 - 23 mg/dL   Creatinine, Ser 8.12 (H) 0.44 - 1.00 mg/dL  Calcium 8.2 (L) 8.9 - 10.3 mg/dL   GFR, Estimated 7 (L) >60 mL/min    Comment: (NOTE) Calculated using the CKD-EPI Creatinine Equation (2021)    Anion gap 14 5 - 15    Comment: Performed at [facility], [address] [place], [place] [address]  Troponin I (High Sensitivity)     Status: None   Collection Time: 03/22/23  4:01 PM  Result Value Ref Range   Troponin I (High Sensitivity) 9 <18 ng/L    Comment: (NOTE) Elevated high sensitivity troponin I (hsTnI) values and significant  changes across serial measurements may suggest ACS but many other  chronic and acute conditions are known to elevate hsTnI results.  Refer to the "Links" section for chest pain algorithms and additional  guidance. Performed at [facility], [address] [place], [place] [address]   CBC     Status: Abnormal   Collection Time: 03/22/23  4:01 PM  Result Value Ref Range   WBC 17.9 (H) 4.0 - 10.5 K/uL   RBC 4.02 3.87 - 5.11 MIL/uL   Hemoglobin 11.8 (L) 12.0 - 15.0 g/dL   HCT 82.9 56.2 - 13.0 %   MCV 97.0 80.0 - 100.0 fL   MCH 29.4 26.0 - 34.0 pg   MCHC 30.3 30.0 - 36.0 g/dL   RDW 86.5 78.4 - 69.6 %   Platelets 230 150 - 400 K/uL   nRBC 0.0 0.0 - 0.2 %    Comment: Performed at [facility], [address] [place], [place] [address]  CBG monitoring, ED     Status: Abnormal   Collection Time: 03/22/23  4:49 PM  Result Value Ref Range   Glucose-Capillary 112 (H) 70 - 99 mg/dL    Comment: Glucose reference range applies only to samples taken after fasting for at least 8 hours.  Troponin I (High Sensitivity)     Status: None   Collection Time: 03/22/23  5:42 PM  Result Value Ref Range   Troponin I (High Sensitivity) 7 <18 ng/L     Comment: (NOTE) Elevated high sensitivity troponin I (hsTnI) values and significant  changes across serial measurements may suggest ACS but many other  chronic and acute conditions are known to elevate hsTnI results.  Refer to the "Links" section for chest pain algorithms and additional  guidance. Performed at [facility], [address], [place], [place] [address]   SARS Coronavirus 2 by RT PCR (hospital order, performed in [facility] hospital lab) *cepheid single result test* Anterior Nasal Swab     Status: None   Collection Time: 03/22/23  5:45 PM   Specimen: Anterior Nasal Swab  Result Value Ref Range   SARS Coronavirus 2 by RT PCR NEGATIVE NEGATIVE    Comment: Performed at [facility] Lab, [address], [place], [place] [address]  CBC     Status: Abnormal   Collection Time: 03/23/23  1:27 AM  Result Value Ref Range   WBC 13.0 (H) 4.0 - 10.5 K/uL   RBC 3.66 (L) 3.87 - 5.11 MIL/uL   Hemoglobin 10.8 (L) 12.0 - 15.0 g/dL   HCT 72.5 (L) 36.6 - 44.0 %   MCV 95.4 80.0 - 100.0 fL   MCH 29.5 26.0 - 34.0 pg   MCHC 30.9 30.0 - 36.0 g/dL   RDW 34.7 (H) 42.5 - 95.6 %   Platelets 216 150 - 400 K/uL   nRBC 0.0 0.0 - 0.2 %  Comment: Performed at [facility], [address] [place], [place] [address]  Basic metabolic panel     Status: Abnormal   Collection Time: 03/23/23  1:27 AM  Result Value Ref Range   Sodium 138 135 - 145 mmol/L   Potassium 6.0 (H) 3.5 - 5.1 mmol/L   Chloride 100 98 - 111 mmol/L   CO2 28 22 - 32 mmol/L   Glucose, Bld 90 70 - 99 mg/dL    Comment: Glucose reference range applies only to samples taken after fasting for at least 8 hours.   BUN 42 (H) 8 - 23 mg/dL   Creatinine, Ser 6.046.80 (H) 0.44 - 1.00 mg/dL   Calcium 8.1 (L) 8.9 - 10.3 mg/dL   GFR, Estimated 6 (L) >60 mL/min    Comment: (NOTE) Calculated using the CKD-EPI Creatinine Equation (2021)    Anion gap 10 5 - 15    Comment: Performed at [facility], [address]  [place], [place] [address]   DG Chest 2 View  Result Date: 03/22/2023 CLINICAL DATA:  Chest pain EXAM: CHEST - 2 VIEW COMPARISON:  CXR 02/06/23 FINDINGS: No pleural effusion. No pneumothorax. Unchanged cardiac and mediastinal contours. No focal airspace opacity. No radiographically apparent displaced rib fractures. Prominent bilateral interstitial opacities are nonspecific and could represent pulmonary venous congestion or atypical infection. Visualized upper abdomen is unremarkable. Vertebral body heights are maintained. IMPRESSION: Prominent bilateral interstitial opacities are nonspecific and could represent pulmonary venous congestion or atypical infection. No focal airspace opacity. Electronically Signed   By: Lorenza CambridgeHemant  Desai M.D.   On: 03/22/2023 15:34    PMH:   Past Medical History:  Diagnosis Date   Anemia 2006   Anginal pain    Carotid artery disease    right ICA occlusion, 40-59% LICA 02/2021 US   CHF (congestive heart failure)    Coronary artery disease    Depression 2014   previously on amitryptiline    Diabetes mellitus with neurological manifestation 2006   Diabetic peripheral neuropathy 04/26/2020   Dysphagia    Dyspnea    ESRD on hemodialysis    Fracture of left ankle 1997   Gastroparesis 07/2016   Heart murmur    HOH (hard of hearing) 2004   Hyperlipidemia 2006   Hypertension 2006   IBS (irritable bowel syndrome) 2002   Leukopenia 2015   Macular degeneration 11/2019   Peripheral vascular disease    Shingles 2009   Stroke 2020   Thyroid nodule 2004    PSH:   Past Surgical History:  Procedure Laterality Date   A/V FISTULAGRAM Right 09/25/2022   Procedure: A/V Fistulagram;  Surgeon: Nada LibmanBrabham, Vance W, MD;  [facility]: [facility] INVASIVE CV LAB;  Service: Cardiovascular;  Laterality: Right;   ABDOMINAL HYSTERECTOMY  2005   AMPUTATION Bilateral 04/04/2022   Procedure: BILATERAL BELOW KNEE AMPUTATION;  Surgeon: Nadara Mustarduda, Marcus V, MD;  [facility];  Service: Orthopedics;  Laterality:  Bilateral;   AV FISTULA PLACEMENT Right 12/22/2021   Procedure: RIGHT ARM ARTERIOVENOUS (AV) BRACIOCPHELAIC FISTULA CREATION;  Surgeon: Maeola Harmanain, Brandon Christopher, MD;  [facility];  Service: Vascular;  Laterality: Right;   AV FISTULA PLACEMENT Right 02/19/2022   Procedure: RIGHT ARTERIOVENOUS FISTULA;  Surgeon: Chuck Hintickson, Christopher S, MD;  [facility];  Service: Vascular;  Laterality: Right;   BASCILIC VEIN TRANSPOSITION Right 08/07/2022   Procedure: SECOND STAGE RIGHT BASILIC VEIN TRANSPOSITION;  Surgeon: Chuck Hintickson, Christopher S, MD;  [facility];  Service: Vascular;  Laterality:  Right;   CATARACT EXTRACTION Left 11/2019   CESAREAN SECTION  1983    CHOLECYSTECTOMY N/A 03/05/2020   Procedure: LAPAROSCOPIC CHOLECYSTECTOMY WITH INTRAOPERATIVE CHOLANGIOGRAM;  Surgeon: Manus Rudd, MD;  Location: [facility] ORS;  Service: General;  Laterality: N/A;   ESOPHAGOGASTRODUODENOSCOPY (EGD) WITH PROPOFOL Left 08/26/2014   Procedure: ESOPHAGOGASTRODUODENOSCOPY (EGD) WITH PROPOFOL;  Surgeon: Willis Modena, MD;  Location: [facility] ENDOSCOPY;  Service: Endoscopy;  Laterality: Left;   ESOPHAGOGASTRODUODENOSCOPY (EGD) WITH PROPOFOL N/A 03/03/2020   Procedure: ESOPHAGOGASTRODUODENOSCOPY (EGD) WITH PROPOFOL;  Surgeon: Beverley Fiedler, MD;  Location: [facility] ENDOSCOPY;  Service: Gastroenterology;  Laterality: N/A;   IR FLUORO GUIDE CV LINE RIGHT  02/15/2022   IR US GUIDE VASC ACCESS RIGHT  02/15/2022   PERIPHERAL VASCULAR BALLOON ANGIOPLASTY Right 09/25/2022   Procedure: PERIPHERAL VASCULAR BALLOON ANGIOPLASTY;  Surgeon: Nada Libman, MD;  [facility];  Service: Cardiovascular;  Laterality: Right;  AVF   RIGHT HEART CATH N/A 07/14/2021   Procedure: RIGHT HEART CATH;  Surgeon: Laurey Morale, MD;  [facility] INVASIVE CV LAB;  Service: Cardiovascular;  Laterality: N/A;   RIGHT/LEFT HEART CATH AND CORONARY ANGIOGRAPHY N/A 04/27/2021   Procedure: RIGHT/LEFT HEART CATH AND CORONARY ANGIOGRAPHY;  Surgeon: Runell Gess, MD;  [facility]: [facility];  Service: Cardiovascular;  Laterality: N/A;    Allergies:  Allergies  Allergen Reactions   Hydralazine Other (See Comments)   Hydralazine Hcl Other (See Comments)    Hair loss   Hydrocodone Itching and Other (See Comments)    Upset stomach   Metformin And Related Nausea And Vomiting and Other (See Comments)    Stomach pains, also   Other Nausea Only and Other (See Comments)    Lettuce- Does not digest this!!   Plaquenil [Hydroxychloroquine Sulfate] Hives   Shellfish Allergy Nausea And Vomiting   Shellfish-Derived Products Nausea Only and Other (See Comments)    Caused an upset stomach   Shrimp (Diagnostic) Nausea Only and Other (See Comments)    Upset stomach    Sulfa Antibiotics Hives    Medications:   Prior to Admission medications   Medication Sig Start Date End Date Taking? Authorizing Provider  amLODipine (NORVASC) 10 MG tablet Take 1 tablet (10 mg total) by mouth daily. 06/04/22   Burnadette Pop, MD  aspirin 81 MG EC tablet Take 1 tablet (81 mg total) by mouth daily. 09/26/21   Milford, Anderson Malta, FNP  atorvastatin (LIPITOR) 40 MG tablet Take 1 tablet (40 mg total) by mouth daily. 11/07/21   Laurey Morale, MD  Blood Glucose Monitoring Suppl [facility] VERIO) w/Device KIT Check blood sugar three times daily. E11.40 05/06/20   Marcine Matar, MD  Continuous Blood Gluc Receiver (DEXCOM G6 RECEIVER) DEVI 1 Device by Does not apply route daily. 11/15/21   Marcine Matar, MD  Continuous Blood Gluc Sensor (DEXCOM G6 SENSOR) MISC 1 packet by Does not apply route daily. 11/15/21   Marcine Matar, MD  Continuous Blood Gluc Transmit (DEXCOM G6 TRANSMITTER) MISC 1 packet by Does not apply route daily. 11/15/21   Marcine Matar, MD  DM-Doxylamine-Acetaminophen (NYQUIL HBP COLD & FLU) 15-6.25-325 MG/15ML LIQD Take 30 mLs by mouth every 8 (eight) hours as needed (Cold).    [provider]  ezetimibe (ZETIA) 10 MG  tablet Take 1 tablet (10 mg total) by mouth daily. 04/11/22   Danford, Earl Lites, MD  ferrous sulfate 325 (65 FE) MG tablet Take 1 tablet (325 mg total) by  mouth daily with breakfast. 11/07/21   Marcine Matar, MD  glucose blood test strip Use as instructedCheck blood sugar three times daily. E11.40 07/11/20   Marcine Matar, MD  HYDROmorphone (DILAUDID) 2 MG tablet Take 0.5 tablets (1 mg total) by mouth every 6 (six) hours as needed for severe pain (pain score 7-10). 11/22/22   Zannie Cove, MD  insulin glargine (LANTUS) 100 UNIT/ML Solostar Pen Inject 10 Units into the skin at bedtime. 04/10/22   Danford, Earl Lites, MD  Insulin Pen Needle 32G X 4 MM MISC Use to inject insulin as directed. 11/07/21   Marcine Matar, MD  isosorbide mononitrate (IMDUR) 30 MG 24 hr tablet Take 1 tablet (30 mg total) by mouth daily. 06/04/22   Burnadette Pop, MD  isosorbide mononitrate (IMDUR) 60 MG 24 hr tablet Take 1 tablet (60 mg total) by mouth daily. 11/23/22   Zannie Cove, MD  labetalol (NORMODYNE) 300 MG tablet Take 1 tablet (300 mg total) by mouth 2 (two) times daily. 06/04/22   Burnadette Pop, MD  multivitamin (RENA-VIT) TABS tablet Take 1 tablet by mouth at bedtime. Patient taking differently: Take 1 tablet by mouth daily. 04/10/22   Danford, Earl Lites, MD  NOVOLOG FLEXPEN 100 UNIT/ML FlexPen Inject 3 Units into the skin in the morning and at bedtime. 06/04/22   [provider]  ondansetron (ZOFRAN) 8 MG tablet Take 1 tablet (8 mg total) by mouth every 8 (eight) hours as needed for nausea 05/23/22     pantoprazole (PROTONIX) 40 MG tablet Take 1 tablet (40 mg total) by mouth daily. 04/11/22   Danford, Earl Lites, MD  polyethylene glycol (MIRALAX / GLYCOLAX) 17 g packet Take 17 g by mouth daily. 06/04/22   Burnadette Pop, MD  promethazine (PHENERGAN) 25 MG tablet Take 25 mg by mouth every 8 (eight) hours as needed for vomiting or nausea. 04/23/22   [provider]   senna-docusate (SENOKOT-S) 8.6-50 MG tablet Take 1 tablet by mouth at bedtime. 11/22/22   Zannie Cove, MD  sevelamer (RENAGEL) 800 MG tablet Take 1,600 mg by mouth 3 (three) times daily. 07/06/22   [provider]  zinc sulfate 220 (50 Zn) MG capsule Take 1 capsule (220 mg total) by mouth daily. 04/11/22   Danford, Earl Lites, MD  Insulin NPH Isophane & Regular (RELION 70/30 [place]) Inject 35 Units into the skin 2 (two) times daily.  08/17/14  [provider]    Discontinued Meds:  There are no discontinued medications.  Social History:  reports that she has quit smoking. Her smoking use included cigarettes. She has a 0.13 pack-year smoking history. She has never been exposed to tobacco smoke. She has never used smokeless tobacco. She reports that she does not drink alcohol and does not use drugs.  Family History:   Family History  Problem Relation Age of Onset   Hypertension Mother    Heart disease Mother    Diabetes Mother    Thyroid disease Mother    Congestive Heart Failure Mother    Breast cancer Maternal Grandmother    Colon cancer Maternal Grandfather    Heart attack Sister    Heart disease Brother    Hyperlipidemia Brother    Hypertension Brother    Diabetes Father    Breast cancer Maternal Aunt     Blood pressure (!) 170/60, pulse 79, temperature 99.1 F (37.3 C), temperature source Oral, resp. rate 14, SpO2 98 %. General: comfortable in bed on RA Heart: RRR  Lungs: clear, normal WOB at rest Abdomen: soft Extremities: no  edema Dialysis Access:  RUE AVF +t/b - on smaller side       Cande Mastropietro, Len Blalock, MD 03/23/2023, 7:43 AM

## 2023-03-23 NOTE — Progress Notes (Signed)
Pt admitted from [facility] where she is a LTC/SNF resident. Per MD, plan for dc tomorrow. Spoke to [employer] with [employer] who confirmed pt is able to return at dc. SW will follow.   Dellie Burns, MSW, LCSW [phone] (coverage)

## 2023-03-23 NOTE — Progress Notes (Signed)
New Admission Note:  Arrival Method: By bed from ED around 2340 Mental Orientation: Alert and oriented x 4 Telemetry: Box 9, CCMD notified Assessment: Completed Skin: Completed, refer to flowsheets IV: Left hand S.L. Pain: 8/10 back Tubes: None Safety Measures: Safety Fall Prevention Plan was given, discussed and signed. Admission: Completed [facility] Orientation: Patient has been orientated to the room, unit and the staff. Family: None  Orders have been reviewed and implemented. Will continue to monitor the patient. Call light has been placed within reach and bed alarm has been activated.   Franky Macho, RN  Phone Number: [phone]

## 2023-03-23 NOTE — Progress Notes (Signed)
   03/23/23 1705  Vitals  Temp 97.8 F (36.6 C)  Temp Source Oral  BP (!) 151/75  MAP (mmHg) 97  Pulse Rate 75  Pulse Rate Source Monitor  ECG Heart Rate 78  Resp 16  Level of Consciousness  Level of Consciousness Alert  MEWS COLOR  MEWS Score Color Green  Oxygen Therapy  SpO2 99 %  O2 Device Nasal Cannula  O2 Flow Rate (L/min) 2 L/min  Patient Activity (if Appropriate) In bed  Pulse Oximetry Type Continuous  Pain Assessment  Pain Scale 0-10  Pain Score 0  PCA/Epidural/Spinal Assessment  Respiratory Pattern Regular;Unlabored  Height and Weight  Weight 100 kg  Type of Weight Post-Dialysis  BMI (Calculated) 39.06  ECG Monitoring  Cardiac Rhythm NSR  MEWS Score  MEWS Temp 0  MEWS Systolic 0  MEWS Pulse 0  MEWS RR 0  MEWS LOC 0  MEWS Score 0   Received patient in bed to unit.  Alert and oriented.  Informed consent signed and in chart.   TX duration:3 hrs  Patient tolerated well.  Transported back to the room  Alert, without acute distress.  Hand-off given to patient's nurse.   Access used:Yes Access issues: No  Total UF removed: 2500 Medication(s) given: Benadryl 25mg  IV Post HD VS: See Above Grid Post HD weight: 100.0kg   [facility]

## 2023-03-23 NOTE — Progress Notes (Addendum)
PROGRESS NOTE    Nancy [name]  BJY:[id number] DOB: 1961-05-31 DOA: 03/22/2023 PCP: Marcine MatarJohnson, Deborah B, MD  Chief Complaint  Patient presents with   Shortness of Breath    Brief Narrative:   Nancy [name] is Nancy [name] 62 y.o. female with medical history significant of ESRD on hemodialysis MWF, hypertension, chronic diastolic CHF, chronic hypoxic respiratory failure on 3 to 4 L O2, hyperlipidemia, coronary artery disease, type 2 diabetes,  PVD status post bilateral BKA, resident of SNF presents with shortness of breath, low blood pressure.   Assessment & Plan:   Principal Problem:   Community acquired pneumonia Active Problems:   ESRD on dialysis   Insulin dependent type 2 diabetes mellitus   Chronic diastolic CHF (congestive heart failure)   HTN (hypertension)   Hyperkalemia   History of CVA (cerebrovascular accident)  Leukocytosis  Volume Overload vs Pneumonia - elevated white count + bilateral interstitial opacities - possible pneumonia vs overload, she's stable today on abx - will complete course of abx given leukocytosis at presentation  - Reportedly she is at baseline 3 to 4 L however she is stable here on room air - Will continue IV Rocephin and azithromycin. - will obtain blood culture. She was already given Mitsuko Luera dose of antibiotics in ED.    ESRD on dialysis -HD MWF.  -dialysis today per renal   Chronic diastolic CHF (congestive heart failure) - Appears euvolemic on exam   Insulin dependent type 2 diabetes mellitus - A1c of 4.9 in December -Monitor without any sliding scale insulin at this time   History of CVA (cerebrovascular accident) Continue aspirin, statin and Zetia   Hyperkalemia -repeat lokelma this AM, follow EKG -dialysis today   HTN (hypertension) - imdur, labetalol, amlodipine --Reportedly was hypotensive at dialysis center but unclear how low.  She typically holds her BP meds prior to dialysis on dialysis days per her RN.      DVT  prophylaxis: heparin Code Status: full Family Communication: none Disposition:   Status is: Observation The patient remains OBS appropriate and will d/c before 2 midnights.   Consultants:  renal  Procedures:  none  Antimicrobials:  Anti-infectives (From admission, onward)    Start     Dose/Rate Route Frequency Ordered Stop   03/23/23 1000  cefTRIAXone (ROCEPHIN) 2 g in sodium chloride 0.9 % 100 mL IVPB        2 g 200 mL/hr over 30 Minutes Intravenous Every 24 hours 03/22/23 2112 03/28/23 0959   03/23/23 1000  azithromycin (ZITHROMAX) 500 mg in sodium chloride 0.9 % 250 mL IVPB        500 mg 250 mL/hr over 60 Minutes Intravenous Every 24 hours 03/22/23 2112 03/28/23 0959   03/22/23 1800  cefTRIAXone (ROCEPHIN) 1 g in sodium chloride 0.9 % 100 mL IVPB        1 g 200 mL/hr over 30 Minutes Intravenous  Once 03/22/23 1749 03/22/23 1926   03/22/23 1800  azithromycin (ZITHROMAX) tablet 500 mg        500 mg Oral  Once 03/22/23 1749 03/22/23 1842       Subjective: C/o some chronic back pain   Objective: Vitals:   03/22/23 2322 03/23/23 0601 03/23/23 0826 03/23/23 0836  BP: (!) 156/91 (!) 170/60 (!) 161/70 (!) 161/70  Pulse: 84 79 84 84  Resp: 17 14  16   Temp: 99 F (37.2 C) 99.1 F (37.3 C)  98.4 F (36.9 C)  TempSrc: Oral Oral  Oral  SpO2:  100% 98%  100%    Intake/Output Summary (Last 24 hours) at 03/23/2023 0917 Last data filed at 03/23/2023 0204 Gross per 24 hour  Intake 300 ml  Output --  Net 300 ml   There were no vitals filed for this visit.  Examination:  General exam: Appears calm and comfortable  Respiratory system: Clear to auscultation. Respiratory effort normal. Cardiovascular system: S1 & S2 heard, RRR.  Gastrointestinal system: Abdomen is nondistended, soft and nontender. . Central nervous system: Alert and oriented. No focal neurological deficits. Extremities: bilateral BKA, trace edema    Data Reviewed: I have personally reviewed following labs  and imaging studies  CBC: Recent Labs  Lab 03/22/23 1601 03/23/23 0127  WBC 17.9* 13.0*  HGB 11.8* 10.8*  HCT 39.0 34.9*  MCV 97.0 95.4  PLT 230 216    Basic Metabolic Panel: Recent Labs  Lab 03/22/23 1601 03/23/23 0127  NA 138 138  K 5.6* 6.0*  CL 96* 100  CO2 28 28  GLUCOSE 102* 90  BUN 35* 42*  CREATININE 6.23* 6.80*  CALCIUM 8.2* 8.1*    GFR: Estimated Creatinine Clearance: 9.5 mL/min (Baily Serpe) (by C-G formula based on SCr of 6.8 mg/dL (H)).  Liver Function Tests: No results for input(s): "AST", "ALT", "ALKPHOS", "BILITOT", "PROT", "ALBUMIN" in the last 168 hours.  CBG: Recent Labs  Lab 03/22/23 1649  GLUCAP 112*     Recent Results (from the past 240 hour(s))  SARS Coronavirus 2 by RT PCR (hospital order, performed in [facility] hospital lab) *cepheid single result test* Anterior Nasal Swab     Status: None   Collection Time: 03/22/23  5:45 PM   Specimen: Anterior Nasal Swab  Result Value Ref Range Status   SARS Coronavirus 2 by RT PCR NEGATIVE NEGATIVE Final    Comment: Performed at [facility], [address], [place], [place] [address]         Radiology Studies: DG Chest 2 View  Result Date: 03/22/2023 CLINICAL DATA:  Chest pain EXAM: CHEST - 2 VIEW COMPARISON:  CXR 02/06/23 FINDINGS: No pleural effusion. No pneumothorax. Unchanged cardiac and mediastinal contours. No focal airspace opacity. No radiographically apparent displaced rib fractures. Prominent bilateral interstitial opacities are nonspecific and could represent pulmonary venous congestion or atypical infection. Visualized upper abdomen is unremarkable. Vertebral body heights are maintained. IMPRESSION: Prominent bilateral interstitial opacities are nonspecific and could represent pulmonary venous congestion or atypical infection. No focal airspace opacity. Electronically Signed   By: Lorenza Cambridge M.D.   On: 03/22/2023 15:34        Scheduled Meds:  amLODipine  10 mg Oral Daily    aspirin EC  81 mg Oral Daily   atorvastatin  40 mg Oral Daily   Chlorhexidine Gluconate Cloth  6 each Topical Q0600   ezetimibe  10 mg Oral Daily   heparin  5,000 Units Subcutaneous Q8H   isosorbide mononitrate  30 mg Oral Daily   labetalol  300 mg Oral BID   lidocaine  2 patch Transdermal Q24H   pantoprazole  40 mg Oral Daily   polyethylene glycol  17 g Oral Daily   senna-docusate  1 tablet Oral QHS   sevelamer carbonate  1,600 mg Oral TID WC   Continuous Infusions:  azithromycin     cefTRIAXone (ROCEPHIN)  IV 2 g (03/23/23 0826)     LOS: 0 days    Time spent: over 30 min    Lacretia Nicks, MD [facility]   To  contact the attending provider between 7A-7P or the covering provider during after hours 7P-7A, please log into the web site [email or web address] and access using universal [place] password for that web site. If you do not have the password, please call the hospital operator.  03/23/2023, 9:17 AM

## 2023-03-24 DIAGNOSIS — J189 Pneumonia, unspecified organism: Secondary | ICD-10-CM | POA: Diagnosis not present

## 2023-03-24 LAB — CBC WITH DIFFERENTIAL/PLATELET
Abs Immature Granulocytes: 0.02 10*3/uL (ref 0.00–0.07)
Basophils Absolute: 0.1 10*3/uL (ref 0.0–0.1)
Basophils Relative: 1 %
Eosinophils Absolute: 0.8 10*3/uL — ABNORMAL HIGH (ref 0.0–0.5)
Eosinophils Relative: 8 %
HCT: 37.1 % (ref 36.0–46.0)
Hemoglobin: 11 g/dL — ABNORMAL LOW (ref 12.0–15.0)
Immature Granulocytes: 0 %
Lymphocytes Relative: 27 %
Lymphs Abs: 2.5 10*3/uL (ref 0.7–4.0)
MCH: 29.5 pg (ref 26.0–34.0)
MCHC: 29.6 g/dL — ABNORMAL LOW (ref 30.0–36.0)
MCV: 99.5 fL (ref 80.0–100.0)
Monocytes Absolute: 1.1 10*3/uL — ABNORMAL HIGH (ref 0.1–1.0)
Monocytes Relative: 12 %
Neutro Abs: 4.8 10*3/uL (ref 1.7–7.7)
Neutrophils Relative %: 52 %
Platelets: 230 10*3/uL (ref 150–400)
RBC: 3.73 MIL/uL — ABNORMAL LOW (ref 3.87–5.11)
RDW: 15.9 % — ABNORMAL HIGH (ref 11.5–15.5)
WBC: 9.2 10*3/uL (ref 4.0–10.5)
nRBC: 0 % (ref 0.0–0.2)

## 2023-03-24 LAB — COMPREHENSIVE METABOLIC PANEL
ALT: 14 U/L (ref 0–44)
AST: 19 U/L (ref 15–41)
Albumin: 3.6 g/dL (ref 3.5–5.0)
Alkaline Phosphatase: 97 U/L (ref 38–126)
Anion gap: 10 (ref 5–15)
BUN: 33 mg/dL — ABNORMAL HIGH (ref 8–23)
CO2: 26 mmol/L (ref 22–32)
Calcium: 8.2 mg/dL — ABNORMAL LOW (ref 8.9–10.3)
Chloride: 99 mmol/L (ref 98–111)
Creatinine, Ser: 6.07 mg/dL — ABNORMAL HIGH (ref 0.44–1.00)
GFR, Estimated: 7 mL/min — ABNORMAL LOW (ref >60)
Glucose, Bld: 168 mg/dL — ABNORMAL HIGH (ref 70–99)
Potassium: 4.8 mmol/L (ref 3.5–5.1)
Sodium: 135 mmol/L (ref 135–145)
Total Bilirubin: 0.8 mg/dL (ref 0.3–1.2)
Total Protein: 7.2 g/dL (ref 6.5–8.1)

## 2023-03-24 LAB — PHOSPHORUS: Phosphorus: 7.4 mg/dL — ABNORMAL HIGH (ref 2.5–4.6)

## 2023-03-24 LAB — MAGNESIUM: Magnesium: 2.6 mg/dL — ABNORMAL HIGH (ref 1.7–2.4)

## 2023-03-24 MED ORDER — AMOXICILLIN 500 MG PO CAPS: 500.0000 mg | ORAL_CAPSULE | Freq: Every day | ORAL | 0 refills | Status: AC

## 2023-03-24 MED ORDER — HYDROMORPHONE HCL 2 MG PO TABS: 1.0000 mg | ORAL_TABLET | Freq: Four times a day (QID) | ORAL | 0 refills | Status: AC | PRN

## 2023-03-24 NOTE — Progress Notes (Addendum)
Nancy [name]   62 y.o. female HTN, HFpEF, O2 dependent, HLD, CASHD, DM, PAD s/p b/l BKA's, resident of SNF ESRD MWF at [facility] with Dr. Marisue Humble with 2 shortened treatments now presenting with shortness of breath  and hypotension. Patient has had to come to the ED for similar complaints often. She denies any fever, chills, cough, chest pain, myalgias. She was noted in the ED to be hypertensive and good saturations on RA with K 5.6 and CXR showing interstitial opacities. Patient was started on Rocephin and Zithro for CAP.   OP HD: [facility] MWF 4h  300/1.5   92kg  2/2 bath  RUE AVF  Hep 5000+ - rocaltrol 0.50 po tiw - venofer 50 q wk - mircera 100 mcg IV q 2 wks, last 4/3  Assessment/ Plan:   ESRD MWF [facility] with multiple shortened treatments and last outpt treatments past week she has left at 97.4, 97.4 and 99.3 kg with listed EDW of 92 kg. Last time she has reached 92 was 1/31; more recently the lowest she's gotten is 93.5kg.  - Tolerated HD on 4/6 with 2.5L tolerated and no cramping - If she's still here will dialyze tomorrow on MWF regimen. No absolute indication for RRT and the patient appears to be  comfortable.  From renal [place] she can receive hd at her center tomorrow.  SOB / resp distress / pulm edema /vol overload -  plan on extra treatment today given shortened treatments this past week. HTN/ vol - restart home regimen. PAD - sp bilat BKA. In SNF MBD ckd - CCa in range, will check phos for binder management. Cont renvela for now +  vdra.  Anemia esrd - Hb 10.8 last ESA given 4/3. Plans for SNF at discharge - she came from SNF.   D/c today it appears.   Subjective:   Feeling better; denies sob/f/c/n/v.   Objective:   BP (!) 162/67 (BP Location: Left Arm)   Pulse 80   Temp 98 F (36.7 C) (Oral)   Resp 15   Wt 95.8 kg   SpO2 99%   BMI 37.41 kg/m   Intake/Output Summary (Last 24 hours) at 03/24/2023 0759 Last data filed at 03/24/2023  0200 Gross per 24 hour  Intake 970 ml  Output 2800 ml  Net -1830 ml   Weight change:   Physical Exam: General: comfortable in bed on RA Heart: RRR Lungs: clear, normal WOB at rest Abdomen: soft Extremities: no  edema Dialysis Access:  RUE AVF +t/b - on smaller side  Imaging: DG Chest 2 View  Result Date: 03/22/2023 CLINICAL DATA:  Chest pain EXAM: CHEST - 2 VIEW COMPARISON:  CXR 02/06/23 FINDINGS: No pleural effusion. No pneumothorax. Unchanged cardiac and mediastinal contours. No focal airspace opacity. No radiographically apparent displaced rib fractures. Prominent bilateral interstitial opacities are nonspecific and could represent pulmonary venous congestion or atypical infection. Visualized upper abdomen is unremarkable. Vertebral body heights are maintained. IMPRESSION: Prominent bilateral interstitial opacities are nonspecific and could represent pulmonary venous congestion or atypical infection. No focal airspace opacity. Electronically Signed   By: Lorenza Cambridge M.D.   On: 03/22/2023 15:34    Labs: BMET Recent Labs  Lab 03/22/23 1601 03/23/23 0127 03/24/23 0328  NA 138 138 135  K 5.6* 6.0* 4.8  CL 96* 100 99  CO2 28 28 26   GLUCOSE 102* 90 168*  BUN 35* 42* 33*  CREATININE 6.23* 6.80* 6.07*  CALCIUM 8.2* 8.1* 8.2*  PHOS  --   --  7.4*   CBC Recent Labs  Lab 03/22/23 1601 03/23/23 0127 03/24/23 0328  WBC 17.9* 13.0* 9.2  NEUTROABS  --   --  4.8  HGB 11.8* 10.8* 11.0*  HCT 39.0 34.9* 37.1  MCV 97.0 95.4 99.5  PLT 230 216 230    Medications:     amLODipine  10 mg Oral Daily   aspirin EC  81 mg Oral Daily   atorvastatin  40 mg Oral Daily   Chlorhexidine Gluconate Cloth  6 each Topical Q0600   ezetimibe  10 mg Oral Daily   heparin  5,000 Units Subcutaneous Q8H   isosorbide mononitrate  30 mg Oral Daily   labetalol  300 mg Oral BID   lidocaine  2 patch Transdermal Q24H   pantoprazole  40 mg Oral Daily   polyethylene glycol  17 g Oral Daily    senna-docusate  1 tablet Oral QHS   sevelamer carbonate  1,600 mg Oral TID WC      Nancy Floor, MD 03/24/2023, 7:59 AM

## 2023-03-24 NOTE — Discharge Summary (Signed)
Physician Discharge Summary  Nancy [name] JXB:[id number] DOB: 23-Nov-1961 DOA: 03/22/2023  PCP: Marcine Matar, MD  Admit date: 03/22/2023 Discharge date: 03/24/2023  Time spent: 40 minutes  Recommendations for Outpatient Follow-up:  Follow outpatient CBC/CMP  insulin discontinued here with A1c 4.9 in December   Follow blood pressure, adjust regimen as needed Follow CXR outpatient   Discharge Diagnoses:  Principal Problem:   Community acquired pneumonia Active Problems:   ESRD on dialysis   Insulin dependent type 2 diabetes mellitus   Chronic diastolic CHF (congestive heart failure)   HTN (hypertension)   Hyperkalemia   History of CVA (cerebrovascular accident)   Discharge Condition: stable  Diet recommendation: renal   Filed Weights   03/23/23 1351 03/23/23 1705 03/24/23 0303  Weight: 100.6 kg 100 kg 95.8 kg    History of present illness:   Nancy [name] is [name] 62 y.o. female with medical history significant of ESRD on hemodialysis MWF, hypertension, chronic diastolic CHF, chronic hypoxic respiratory failure on 3 to 4 L O2, hyperlipidemia, coronary artery disease, type 2 diabetes,  PVD status post bilateral BKA, resident of SNF presents with shortness of breath, low blood pressure.    Admitted for pneumonia vs volume overload.  Improved on 4/7, stable for discharge.   See below for additional details  Hospital Course:  Assessment and Plan:  Leukocytosis  Volume Overload vs Pneumonia - elevated white count + bilateral interstitial opacities - possible pneumonia vs overload, she's stable today on abx - will complete course of abx given leukocytosis at presentation  - Reportedly she is at baseline 3 to 4 L however she is stable here on room air - follow cultures  - renal to manage volume with dialysis outpatient    ESRD on dialysis -HD MWF.  -dialysis today per renal   Chronic diastolic CHF (congestive heart failure) - Appears euvolemic on exam    Insulin dependent type 2 diabetes mellitus - A1c of 4.9 in December -- insulin d/c'd - follow BG's outpatient, but appears no longer need for meds right now   History of CVA (cerebrovascular accident) Continue aspirin, statin and Zetia   Hyperkalemia -resolved -follow per renal outpatient    HTN (hypertension) - imdur, labetalol, amlodipine --Reportedly was hypotensive at dialysis center but unclear how low.  She typically holds her BP meds prior to dialysis on dialysis days per her RN.        Procedures: none   Consultations: Renal   Discharge Exam: Vitals:   03/23/23 2049 03/24/23 0519  BP: (!) 164/79 (!) 162/67  Pulse: 88 80  Resp: 16 15  Temp: 98.8 F (37.1 C) 98 F (36.7 C)  SpO2: 97% 99%   No complaints  General: No acute distress.  Watching Kaamil Morefield video on her phone. Cardiovascular: RRR Lungs: unlabored Abdomen: Soft, nontender, nondistended Neurological: Alert and oriented 3. Moves all extremities 4 with equal strength. Cranial nerves II through XII grossly intact. Extremities:  bilateral BKA  Discharge Instructions   Discharge Instructions     Diet - low sodium heart healthy   Complete by: As directed    Discharge instructions   Complete by: As directed    You were seen for concern for pneumonia.  I'm not sure this was truly pneumonia, but because you had Yvanna Vidas high white count and x ray changes, we'll send you home with Bernhard Koskinen course of antibiotics.  I suspect Jera Headings lot of your presentation was related to your need for dialysis and volume overload.  Continue outpatient dialysis and renal follow up as scheduled.  I've resumed your blood pressure meds.  Follow with your PCP and renal providers at home.  Your A1c in December was 4.9.  I don't think you need anymore treatment for your diabetes at this time.  Stop your insulin.  Follow your blood sugars with your PCP outpatient.  Return for new, recurrent, or worsening symptoms.  Please ask your PCP to request  records from this hospitalization so they know what was done and what the next steps will be.   Increase activity slowly   Complete by: As directed       Allergies as of 03/24/2023       Reactions   Hydralazine Other (See Comments)   Hydralazine Hcl Other (See Comments)   Hair loss   Hydrocodone Itching, Other (See Comments)   Upset stomach   Metformin And Related Nausea And Vomiting, Other (See Comments)   Stomach pains, also   Other Nausea Only, Other (See Comments)   Lettuce- Does not digest this!!   Plaquenil [hydroxychloroquine Sulfate] Hives   Shellfish Allergy Nausea And Vomiting   Shellfish-derived Products Nausea Only, Other (See Comments)   Caused an upset stomach   Shrimp (diagnostic) Nausea Only, Other (See Comments)   Upset stomach    Sulfa Antibiotics Hives        Medication List     STOP taking these medications    insulin glargine 100 UNIT/ML Solostar Pen Commonly known as: LANTUS   NovoLOG FlexPen 100 UNIT/ML FlexPen Generic drug: insulin aspart       TAKE these medications    amLODipine 10 MG tablet Commonly known as: NORVASC Take 1 tablet (10 mg total) by mouth daily.   amoxicillin 500 MG capsule Commonly known as: AMOXIL Take 1 capsule (500 mg total) by mouth daily for 2 days. Start taking on: March 25, 2023   Aspirin Low Dose 81 MG tablet Generic drug: aspirin EC Take 1 tablet (81 mg total) by mouth daily.   atorvastatin 40 MG tablet Commonly known as: LIPITOR Take 1 tablet (40 mg total) by mouth daily.   Dexcom G6 Receiver Devi 1 Device by Does not apply route daily.   Dexcom G6 Sensor Misc 1 packet by Does not apply route daily.   Dexcom G6 Transmitter Misc 1 packet by Does not apply route daily.   ezetimibe 10 MG tablet Commonly known as: ZETIA Take 1 tablet (10 mg total) by mouth daily.   ferrous sulfate 325 (65 FE) MG tablet Take 1 tablet (325 mg total) by mouth daily with breakfast.   glucose blood test strip Use  as instructedCheck blood sugar three times daily. E11.40   HYDROmorphone 2 MG tablet Commonly known as: DILAUDID Take 0.5 tablets (1 mg total) by mouth every 6 (six) hours as needed for severe pain (pain score 7-10). What changed: how much to take   Insulin Pen Needle 32G X 4 MM Misc Use to inject insulin as directed.   isosorbide mononitrate 30 MG 24 hr tablet Commonly known as: IMDUR Take 1 tablet (30 mg total) by mouth daily. What changed: how much to take   labetalol 300 MG tablet Commonly known as: NORMODYNE Take 1 tablet (300 mg total) by mouth 2 (two) times daily.   levothyroxine 25 MCG tablet Commonly known as: SYNTHROID Take 25 mcg by mouth daily.   multivitamin tablet Take 1 tablet by mouth at bedtime.   NyQuil HBP Cold & Flu 15-6.25-325 MG/15ML  Liqd Generic drug: DM-Doxylamine-Acetaminophen Take 30 mLs by mouth every 8 (eight) hours as needed (Cold).   omeprazole 20 MG capsule Commonly known as: PRILOSEC Take 40 mg by mouth daily.   ondansetron 8 MG tablet Commonly known as: ZOFRAN Take 1 tablet (8 mg total) by mouth every 8 (eight) hours as needed for nausea What changed:  when to take this reasons to take this   OneTouch Verio w/Device Kit Check blood sugar three times daily. E11.40   pantoprazole 40 MG tablet Commonly known as: PROTONIX Take 1 tablet (40 mg total) by mouth daily.   polyethylene glycol 17 g packet Commonly known as: MIRALAX / GLYCOLAX Take 17 g by mouth daily.   promethazine 25 MG tablet Commonly known as: PHENERGAN Take 25 mg by mouth every 8 (eight) hours as needed for vomiting or nausea.   senna-docusate 8.6-50 MG tablet Commonly known as: Senokot-S Take 1 tablet by mouth at bedtime.   sevelamer 800 MG tablet Commonly known as: RENAGEL Take 1,600 mg by mouth 3 (three) times daily.   zinc sulfate 220 (50 Zn) MG capsule Take 1 capsule (220 mg total) by mouth daily.       Allergies  Allergen Reactions   Hydralazine  Other (See Comments)   Hydralazine Hcl Other (See Comments)    Hair loss   Hydrocodone Itching and Other (See Comments)    Upset stomach   Metformin And Related Nausea And Vomiting and Other (See Comments)    Stomach pains, also   Other Nausea Only and Other (See Comments)    Lettuce- Does not digest this!!   Plaquenil [Hydroxychloroquine Sulfate] Hives   Shellfish Allergy Nausea And Vomiting   Shellfish-Derived Products Nausea Only and Other (See Comments)    Caused an upset stomach   Shrimp (Diagnostic) Nausea Only and Other (See Comments)    Upset stomach    Sulfa Antibiotics Hives      The results of significant diagnostics from this hospitalization (including imaging, microbiology, ancillary and laboratory) are listed below for reference.    Significant Diagnostic Studies: DG Chest 2 View  Result Date: 03/22/2023 CLINICAL DATA:  Chest pain EXAM: CHEST - 2 VIEW COMPARISON:  CXR 02/06/23 FINDINGS: No pleural effusion. No pneumothorax. Unchanged cardiac and mediastinal contours. No focal airspace opacity. No radiographically apparent displaced rib fractures. Prominent bilateral interstitial opacities are nonspecific and could represent pulmonary venous congestion or atypical infection. Visualized upper abdomen is unremarkable. Vertebral body heights are maintained. IMPRESSION: Prominent bilateral interstitial opacities are nonspecific and could represent pulmonary venous congestion or atypical infection. No focal airspace opacity. Electronically Signed   By: Lorenza Cambridge M.D.   On: 03/22/2023 15:34   DG Chest 2 View  Result Date: 03/11/2023 CLINICAL DATA:  Shortness of breath EXAM: CHEST - 2 VIEW COMPARISON:  02/06/2023 FINDINGS: Shallow inspiration. Cardiac enlargement. No vascular congestion, edema, or consolidation. No pleural effusions. No pneumothorax. Mediastinal contours appear intact. IMPRESSION: Cardiac enlargement.  No evidence of active pulmonary disease. Electronically  Signed   By: Burman Nieves M.D.   On: 03/11/2023 03:25    Microbiology: Recent Results (from the past 240 hour(s))  SARS Coronavirus 2 by RT PCR (hospital order, performed in [facility] hospital lab) *cepheid single result test* Anterior Nasal Swab     Status: None   Collection Time: 03/22/23  5:45 PM   Specimen: Anterior Nasal Swab  Result Value Ref Range Status   SARS Coronavirus 2 by RT PCR NEGATIVE NEGATIVE Final  Comment: Performed at [facility], [address] [place], [place] [address]  Culture, blood (Routine X 2) w Reflex to ID Panel     Status: None (Preliminary result)   Collection Time: 03/23/23 12:21 AM   Specimen: BLOOD  Result Value Ref Range Status   Specimen Description BLOOD BLOOD LEFT ARM  Final   Special Requests   Final    BOTTLES DRAWN AEROBIC AND ANAEROBIC Blood Culture adequate volume   Culture   Final    NO GROWTH < 12 HOURS Performed at [facility], [address] [place], [place] [address]    Report Status PENDING  Incomplete  Culture, blood (Routine X 2) w Reflex to ID Panel     Status: None (Preliminary result)   Collection Time: 03/23/23 12:21 AM   Specimen: BLOOD  Result Value Ref Range Status   Specimen Description BLOOD BLOOD LEFT ARM  Final   Special Requests   Final    BOTTLES DRAWN AEROBIC AND ANAEROBIC Blood Culture adequate volume   Culture   Final    NO GROWTH < 12 HOURS Performed at [facility], [address] [place], [place] [address]    Report Status PENDING  Incomplete     Labs: Basic Metabolic Panel: Recent Labs  Lab 03/22/23 1601 03/23/23 0127 03/24/23 0328  NA 138 138 135  K 5.6* 6.0* 4.8  CL 96* 100 99  CO2 28 28 26   GLUCOSE 102* 90 168*  BUN 35* 42* 33*  CREATININE 6.23* 6.80* 6.07*  CALCIUM 8.2* 8.1* 8.2*  MG  --   --  2.6*  PHOS  --   --  7.4*   Liver Function Tests: Recent Labs  Lab 03/24/23 0328  AST 19  ALT 14  ALKPHOS 97  BILITOT 0.8  PROT 7.2  ALBUMIN 3.6   No  results for input(s): "LIPASE", "AMYLASE" in the last 168 hours. No results for input(s): "AMMONIA" in the last 168 hours. CBC: Recent Labs  Lab 03/22/23 1601 03/23/23 0127 03/24/23 0328  WBC 17.9* 13.0* 9.2  NEUTROABS  --   --  4.8  HGB 11.8* 10.8* 11.0*  HCT 39.0 34.9* 37.1  MCV 97.0 95.4 99.5  PLT 230 216 230   Cardiac Enzymes: No results for input(s): "CKTOTAL", "CKMB", "CKMBINDEX", "TROPONINI" in the last 168 hours. BNP: BNP (last 3 results) Recent Labs    05/30/22 0358 11/04/22 0117  BNP 654.3* 676.3*    ProBNP (last 3 results) No results for input(s): "PROBNP" in the last 8760 hours.  CBG: Recent Labs  Lab 03/22/23 1649  GLUCAP 112*       Signed:  Lacretia Nicksaldwell Powell MD.  [facility] 03/24/2023, 9:13 AM

## 2023-03-24 NOTE — Plan of Care (Signed)

## 2023-03-24 NOTE — TOC Transition Note (Signed)
Transition of Care [facility]) - CM/SW Discharge Note   Patient Details  Name: Nancy [name] MRN: [id number] Date of Birth: October 29, 1961  Transition of Care [facility]) CM/SW Contact:  Deatra Robinson, LCSW Phone Number: 03/24/2023, 10:21 AM   Clinical Narrative:  Pt for dc back to [facility] where she is a LTC resident. Spoke to [employer] with [employer] who confirmed they are prepared to admit pt today. Pt aware of dc and reports agreeable. RN provided with number for report and PTAR arranged for transport. SW signing off at dc.   Dellie Burns, MSW, LCSW [phone] (coverage)      Final next level of care: Skilled Nursing Facility Barriers to Discharge: No Barriers Identified   Patient Goals and CMS Choice      Discharge Placement                Patient chooses bed at: Other - please specify in the comment section below: [facility]) Patient to be transferred to facility by: PTAR Name of family member notified: Pt to update family Patient and family notified of of transfer: 03/24/23  Discharge Plan and Services Additional resources added to the After Visit Summary for                                       Social Determinants of Health (SDOH) Interventions SDOH Screenings   Food Insecurity: No Food Insecurity (09/28/2021)  Recent Concern: Food Insecurity - Food Insecurity Present (07/11/2021)  Housing: Low Risk  (09/28/2021)  Transportation Needs: No Transportation Needs (09/28/2021)  Depression (PHQ2-9): Low Risk  (10/20/2021)  Financial Resource Strain: Low Risk  (09/28/2021)  Recent Concern: Financial Resource Strain - High Risk (07/11/2021)  Tobacco Use: Medium Risk (03/22/2023)     Readmission Risk Interventions    02/16/2022   11:55 AM 09/21/2021    9:05 AM  Readmission Risk Prevention Plan  Transportation Screening Complete Complete  Medication Review (RN Care Manager) Complete Complete  PCP or Specialist appointment within 3-5 days of  discharge  Complete  HRI or Home Care Consult  Complete  SW Recovery Care/Counseling Consult  Complete  Palliative Care Screening  Complete  Skilled Nursing Facility  Not Applicable

## 2023-03-26 LAB — CULTURE, BLOOD (ROUTINE X 2)

## 2023-03-28 LAB — CULTURE, BLOOD (ROUTINE X 2)
Culture: NO GROWTH
Culture: NO GROWTH
Special Requests: ADEQUATE
Special Requests: ADEQUATE

## 2023-04-02 ENCOUNTER — Ambulatory Visit (INDEPENDENT_AMBULATORY_CARE_PROVIDER_SITE_OTHER): Payer: Medicare Other | Admitting: Orthopedic Surgery

## 2023-04-02 DIAGNOSIS — Z89512 Acquired absence of left leg below knee: Secondary | ICD-10-CM | POA: Diagnosis not present

## 2023-04-02 DIAGNOSIS — Z89511 Acquired absence of right leg below knee: Secondary | ICD-10-CM | POA: Diagnosis not present

## 2023-04-07 ENCOUNTER — Encounter: Payer: Self-pay | Admitting: Orthopedic Surgery

## 2023-04-07 NOTE — Progress Notes (Signed)
Office Visit Note   Patient: Nancy [name]           Date of Birth: [birth year]           MRN: [id number] Visit Date: 04/02/2023              Requested by: Marcine Matar, MD [address] [place] [address] [place],  [place] [address] PCP: Marcine Matar, MD  Chief Complaint  Patient presents with   Right Leg - Follow-up    Hx BKA 2023 Needs Rx for prosthetic fitting   Left Leg - Follow-up    Hx BKA 2023 Needs Rx for prosthetic fitting      HPI: Patient is a 62 year old woman with bilateral transtibial amputee.  She has loosening of the prosthesis has instability with gait and pain over the residual limb.  Assessment & Plan: Visit Diagnoses:  1. S/P bilateral below knee amputation     Plan: Prescription was provided for new socket liner materials and supplies for both transtibial prosthesis.  Follow-Up Instructions: No follow-ups on file.   Ortho Exam  Patient is alert, oriented, no adenopathy, well-dressed, normal affect, normal respiratory effort. Examination patient's socket is loose she has decreased residual volume in both legs there is no open ulcers there is and bearing tenderness. Patient is an existing bilateral transtibial  amputee.  Patient's current comorbidities are not expected to impact the ability to function with the prescribed prosthesis. Patient verbally communicates a strong desire to use a prosthesis. Patient currently requires mobility aids to ambulate without a prosthesis.  Expects not to use mobility aids with a new prosthesis.  Patient is a K3 level ambulator that spends a lot of time walking around on uneven terrain over obstacles, up and down stairs, and ambulates with a variable cadence.      Imaging: No results found. No images are attached to the encounter.  Labs: Lab Results  Component Value Date   HGBA1C 4.9 11/18/2022   HGBA1C 6.3 (H) 02/16/2022   HGBA1C 5.3 12/23/2021   REPTSTATUS 03/28/2023 FINAL 03/23/2023    REPTSTATUS 03/28/2023 FINAL 03/23/2023   GRAMSTAIN  02/05/2022    RARE WBC PRESENT, PREDOMINANTLY MONONUCLEAR RARE GRAM NEGATIVE RODS Performed at [facility], [address], [place], [place] [address]    CULT  03/23/2023    NO GROWTH 5 DAYS Performed at [facility], [address] [place], [place] [address]    CULT  03/23/2023    NO GROWTH 5 DAYS Performed at [facility], [address] [place], [place] [address]    Cindie Crumbly KOSERI 02/05/2022     Lab Results  Component Value Date   ALBUMIN 3.6 03/24/2023   ALBUMIN 3.7 03/11/2023   ALBUMIN 3.9 02/06/2023   PREALBUMIN 8.4 (L) 04/04/2022    Lab Results  Component Value Date   MG 2.6 (H) 03/24/2023   MG 2.3 11/21/2022   MG 2.0 04/04/2022   Lab Results  Component Value Date   VD25OH 14.28 (L) 04/04/2022    Lab Results  Component Value Date   PREALBUMIN 8.4 (L) 04/04/2022      Latest Ref Rng & Units 03/24/2023    3:28 AM 03/23/2023    1:27 AM 03/22/2023    4:01 PM  CBC EXTENDED  WBC 4.0 - 10.5 K/uL 9.2  13.0  17.9   RBC 3.87 - 5.11 MIL/uL 3.73  3.66  4.02   Hemoglobin 12.0 - 15.0 g/dL 11.0  10.8  11.8   HCT 36.0 - 46.0 % 37.1  34.9  39.0   Platelets 150 - 400 K/uL 230  216  230   NEUT# 1.7 - 7.7 K/uL 4.8     Lymph# 0.7 - 4.0 K/uL 2.5        There is no height or weight on file to calculate BMI.  Orders:  No orders of the defined types were placed in this encounter.  No orders of the defined types were placed in this encounter.    Procedures: No procedures performed  Clinical Data: No additional findings.  ROS:  All other systems negative, except as noted in the HPI. Review of Systems  Objective: Vital Signs: There were no vitals taken for this visit.  Specialty Comments:  No specialty comments available.  PMFS History: Patient Active Problem List   Diagnosis Date Noted   History of CVA (cerebrovascular accident) 03/22/2023   Community acquired pneumonia  03/22/2023   Acute respiratory failure with hypoxia 11/04/2022   Pain, unspecified 08/15/2022   Complication of vascular dialysis catheter 08/07/2022   Chest pain 05/30/2022   Fluid overload 05/30/2022   Acute pulmonary edema 05/30/2022   Iron deficiency anemia, unspecified 04/26/2022   Moderate protein-calorie malnutrition 04/18/2022   Nausea 04/10/2022   Hyponatremia 04/04/2022   Prolonged QT interval 04/04/2022   Anaphylactic shock, unspecified, initial encounter 02/23/2022   Coagulation defect, unspecified 02/23/2022   Other allergy, initial encounter 02/23/2022   Pressure injury of skin 02/19/2022   Anemia in chronic kidney disease 02/16/2022   Dependence on renal dialysis 02/16/2022   Hyperlipidemia, unspecified 02/16/2022   Hypertensive heart and chronic kidney disease with heart failure and with stage 5 chronic kidney disease, or end stage renal disease 02/16/2022   Personal history of nicotine dependence 02/16/2022   Personal history of transient ischemic attack (TIA), and cerebral infarction without residual deficits 02/16/2022   Secondary hyperparathyroidism of renal origin 02/16/2022   Type 2 diabetes mellitus with diabetic peripheral angiopathy without gangrene 02/16/2022   Renal anasarca 02/14/2022   ESRD on dialysis 02/14/2022   Hyperkalemia 02/14/2022   S/P arteriovenous (AV) fistula creation 12/22/2021   Critical limb ischemia of both lower extremities 11/22/2021   Subacute osteomyelitis, right ankle and foot    Peripheral artery disease 10/27/2021   Acute exacerbation of congestive heart failure 10/24/2021   Acute cardiogenic pulmonary edema 10/06/2021   CAD (coronary artery disease) 10/06/2021   Goals of care, counseling/discussion    Gangrene of left foot    Cellulitis of left foot    Diabetic foot ulcers 09/04/2021   CHF (congestive heart failure) 07/07/2021   Acute postoperative anemia due to expected blood loss superimposed on anemia of chronic renal  insufficiency 06/30/2021   CKD (chronic kidney disease), stage IV 06/11/2021   Chronic diastolic CHF (congestive heart failure) 06/10/2021   Abnormal nuclear stress test    Dyspnea on exertion 03/07/2021   Orthopnea 02/25/2021   History of cerebrovascular accident (CVA) with residual deficit 05/06/2020   Diabetic peripheral neuropathy 04/26/2020   AKI (acute kidney injury) 04/20/2020   Generalized weakness 04/20/2020   Dehydration 04/20/2020   Generalized abdominal pain    Pancreatitis, acute 02/29/2020   Cerebrovascular accident (CVA) 11/08/2019   Stenosis of right carotid artery 11/08/2019   Mixed diabetic hyperlipidemia associated with type 2 diabetes mellitus 11/08/2019   Cortical age-related cataract of both eyes 08/30/2019   Gait abnormality 08/30/2019   Statin declined 08/30/2019   Gastroesophageal reflux disease without  esophagitis 04/18/2018   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   Insulin dependent type 2 diabetes mellitus 10/29/2017   History of macular degeneration 10/29/2017   Chronic cervical pain 10/29/2016   Myalgia 09/14/2016   Insomnia 09/14/2016   Tinea pedis of both feet 08/20/2016   Macular degeneration 08/17/2016   Low back pain 09/21/2014   Hypertensive urgency 09/21/2014   Diabetic gastroparesis associated with type 2 diabetes mellitus 09/14/2014   HTN (hypertension) 09/08/2014   Thyroid nodule 08/17/2014   Morbid obesity 08/17/2014   Hypokalemia 08/06/2014   Type II diabetes mellitus with neurological manifestations, uncontrolled 08/04/2014   Past Medical History:  Diagnosis Date   Anemia 2006   Anginal pain    Carotid artery disease    right ICA occlusion, 40-59% LICA 02/2021 [place]   CHF (congestive heart failure)    Coronary artery disease    Depression 2014   previously on amitryptiline    Diabetes mellitus with neurological manifestation 2006   Diabetic peripheral neuropathy 04/26/2020   Dysphagia    Dyspnea    ESRD on hemodialysis    Fracture of left  ankle 1997   Gastroparesis 07/2016   Heart murmur    HOH (hard of hearing) 2004   Hyperlipidemia 2006   Hypertension 2006   IBS (irritable bowel syndrome) 2002   Leukopenia 2015   Macular degeneration 11/2019   Peripheral vascular disease    Shingles 2009   Stroke 2020   Thyroid nodule 2004    Family History  Problem Relation Age of Onset   Hypertension Mother    Heart disease Mother    Diabetes Mother    Thyroid disease Mother    Congestive Heart Failure Mother    Breast cancer Maternal Grandmother    Colon cancer Maternal Grandfather    Heart attack Sister    Heart disease Brother    Hyperlipidemia Brother    Hypertension Brother    Diabetes Father    Breast cancer Maternal Aunt     Past Surgical History:  Procedure Laterality Date   A/V FISTULAGRAM Right 09/25/2022   Procedure: A/V Fistulagram;  Surgeon: Nada Libman, MD;  Location: [facility]VAS[facility];  Service: Cardiovascular;  Laterality: Right;   ABDOMINAL HYSTERECTOMY  2005   AMPUTATION Bilateral 04/04/2022   Procedure: BILATERAL BELOW KNEE AMPUTATION;  Surgeon: Nadara Mustard, MD;  Location: [facility] OR;  Service: Orthopedics;  Laterality: Bilateral;   AV FISTULA PLACEMENT Right 12/22/2021   Procedure: RIGHT ARM ARTERIOVENOUS (AV) BRACIOCPHELAIC FISTULA CREATION;  Surgeon: Maeola Harman, MD;  Location: [facility] OR;  Service: Vascular;  Laterality: Right;   AV FISTULA PLACEMENT Right 02/19/2022   Procedure: RIGHT ARTERIOVENOUS FISTULA;  Surgeon: Chuck Hint, MD;  Location: [facility] OR;  Service: Vascular;  Laterality: Right;   BASCILIC VEIN TRANSPOSITION Right 08/07/2022   Procedure: SECOND STAGE RIGHT BASILIC VEIN TRANSPOSITION;  Surgeon: Chuck Hint, MD;  Location: [facility] OR;  Service: Vascular;  Laterality: Right;   CATARACT EXTRACTION Left 11/2019   CESAREAN SECTION  1983    CHOLECYSTECTOMY N/A 03/05/2020   Procedure: LAPAROSCOPIC CHOLECYSTECTOMY WITH INTRAOPERATIVE CHOLANGIOGRAM;  Surgeon: Manus Rudd, MD;  Location: [facility] ORS;  Service: General;  Laterality: N/A;   ESOPHAGOGASTRODUODENOSCOPY (EGD) WITH PROPOFOL Left 08/26/2014   Procedure: ESOPHAGOGASTRODUODENOSCOPY (EGD) WITH PROPOFOL;  Surgeon: Willis Modena, MD;  Location: [facility];  Service: Endoscopy;  Laterality: Left;   ESOPHAGOGASTRODUODENOSCOPY (EGD) WITH PROPOFOL N/A 03/03/2020   Procedure: ESOPHAGOGASTRODUODENOSCOPY (EGD) WITH PROPOFOL;  Surgeon: Beverley Fiedler, MD;  Location: [facility];  Service: Gastroenterology;  Laterality: N/A;   IR FLUORO GUIDE CV LINE RIGHT  02/15/2022   IR US GUIDE VASC ACCESS RIGHT  02/15/2022   PERIPHERAL VASCULAR BALLOON ANGIOPLASTY Right 09/25/2022   Procedure: PERIPHERAL VASCULAR BALLOON ANGIOPLASTY;  Surgeon: Nada Libman, MD;  Location: [facility];  Service: Cardiovascular;  Laterality: Right;  AVF   RIGHT HEART CATH N/A 07/14/2021   Procedure: RIGHT HEART CATH;  Surgeon: Laurey Morale, MD;  Location: [facility] INVASIVE CV LAB;  Service: Cardiovascular;  Laterality: N/A;   RIGHT/LEFT HEART CATH AND CORONARY ANGIOGRAPHY N/A 04/27/2021   Procedure: RIGHT/LEFT HEART CATH AND CORONARY ANGIOGRAPHY;  Surgeon: Runell Gess, MD;  Location: [facility];  Service: Cardiovascular;  Laterality: N/A;   Social History   Occupational History   Occupation: Employed FT as Warden/ranger    Comment: [place] , [place]  Tobacco Use   Smoking status: Former    Packs/day: 0.25    Years: 0.50    Additional pack years: 0.00    Total pack years: 0.13    Types: Cigarettes    Passive exposure: Never   Smokeless tobacco: Never   Tobacco comments:    Former smoker 11/03/21  Vaping Use   Vaping Use: Never used  Substance and Sexual Activity   Alcohol use: No   Drug use: No   Sexual activity: Not Currently    Birth control/protection: None

## 2023-04-08 ENCOUNTER — Other Ambulatory Visit (HOSPITAL_COMMUNITY): Payer: Self-pay

## 2023-04-08 MED ORDER — HYDROMORPHONE HCL 2 MG PO TABS
2.0000 mg | ORAL_TABLET | ORAL | 0 refills | Status: DC | PRN
  Filled 2023-04-08: qty 30, 5d supply, fill #0

## 2023-04-08 MED ORDER — POLYETHYLENE GLYCOL 3350 17 G PO PACK
1.0000 | PACK | Freq: Every day | ORAL | 0 refills | Status: DC
  Filled 2023-04-08: qty 14, 14d supply, fill #0

## 2023-04-08 MED ORDER — ONDANSETRON HCL 8 MG PO TABS
8.0000 mg | ORAL_TABLET | Freq: Two times a day (BID) | ORAL | 0 refills | Status: DC | PRN
  Filled 2023-04-08: qty 10, 5d supply, fill #0

## 2023-04-08 MED ORDER — LEVOTHYROXINE SODIUM 25 MCG PO TABS
25.0000 ug | ORAL_TABLET | Freq: Every day | ORAL | 0 refills | Status: DC
  Filled 2023-04-08: qty 14, 14d supply, fill #0

## 2023-04-08 MED ORDER — MULTI-VITAMIN DAILY PO TABS: 1.0000 | ORAL_TABLET | Freq: Every day | ORAL | 0 refills | Status: DC

## 2023-04-08 MED ORDER — LABETALOL HCL 300 MG PO TABS
300.0000 mg | ORAL_TABLET | Freq: Two times a day (BID) | ORAL | 0 refills | Status: DC
  Filled 2023-04-08: qty 28, 14d supply, fill #0

## 2023-04-08 MED ORDER — PROMETHAZINE HCL 25 MG PO TABS
25.0000 mg | ORAL_TABLET | Freq: Three times a day (TID) | ORAL | 3 refills | Status: DC | PRN
  Filled 2023-04-08: qty 15, 5d supply, fill #0
  Filled 2023-05-17: qty 15, 5d supply, fill #1
  Filled 2023-07-23: qty 15, 5d supply, fill #2

## 2023-04-08 MED ORDER — FERROUS SULFATE 325 (65 FE) MG PO TABS
325.0000 mg | ORAL_TABLET | Freq: Every day | ORAL | 0 refills | Status: DC
  Filled 2023-04-08: qty 14, 14d supply, fill #0

## 2023-04-08 MED ORDER — OMEPRAZOLE 20 MG PO CPDR
40.0000 mg | DELAYED_RELEASE_CAPSULE | Freq: Every day | ORAL | 0 refills | Status: DC
  Filled 2023-04-08: qty 28, 14d supply, fill #0

## 2023-04-08 MED ORDER — EZETIMIBE 10 MG PO TABS
10.0000 mg | ORAL_TABLET | Freq: Every day | ORAL | 0 refills | Status: DC
  Filled 2023-04-08: qty 14, 14d supply, fill #0

## 2023-04-08 MED ORDER — AMLODIPINE BESYLATE 10 MG PO TABS
10.0000 mg | ORAL_TABLET | Freq: Every day | ORAL | 0 refills | Status: DC
  Filled 2023-04-08: qty 14, 14d supply, fill #0

## 2023-04-08 MED ORDER — SENNOSIDES-DOCUSATE SODIUM 8.6-50 MG PO TABS
1.0000 | ORAL_TABLET | Freq: Every day | ORAL | 0 refills | Status: DC
  Filled 2023-04-08: qty 14, 14d supply, fill #0

## 2023-04-08 MED ORDER — ISOSORBIDE MONONITRATE ER 30 MG PO TB24
60.0000 mg | ORAL_TABLET | Freq: Every day | ORAL | 0 refills | Status: DC
  Filled 2023-04-08: qty 28, 14d supply, fill #0

## 2023-04-08 MED ORDER — ZINC 220 (50 ZN) MG PO CAPS: 1.0000 | ORAL_CAPSULE | Freq: Every day | ORAL | 0 refills | Status: DC

## 2023-04-08 MED ORDER — ATORVASTATIN CALCIUM 40 MG PO TABS
40.0000 mg | ORAL_TABLET | Freq: Every day | ORAL | 0 refills | Status: DC
  Filled 2023-04-08: qty 14, 14d supply, fill #0

## 2023-04-08 MED ORDER — SEVELAMER CARBONATE 800 MG PO TABS
1600.0000 mg | ORAL_TABLET | Freq: Three times a day (TID) | ORAL | 0 refills | Status: DC
  Filled 2023-04-08: qty 126, 21d supply, fill #0

## 2023-04-16 IMAGING — DX DG FOOT COMPLETE 3+V*L*
3 series · 3 of 3 positions shown · non-contrast
Comparison: November 17, 2013.

CLINICAL DATA: Left foot infection.

EXAM:
LEFT FOOT - COMPLETE 3+ VIEW

[foot ap]
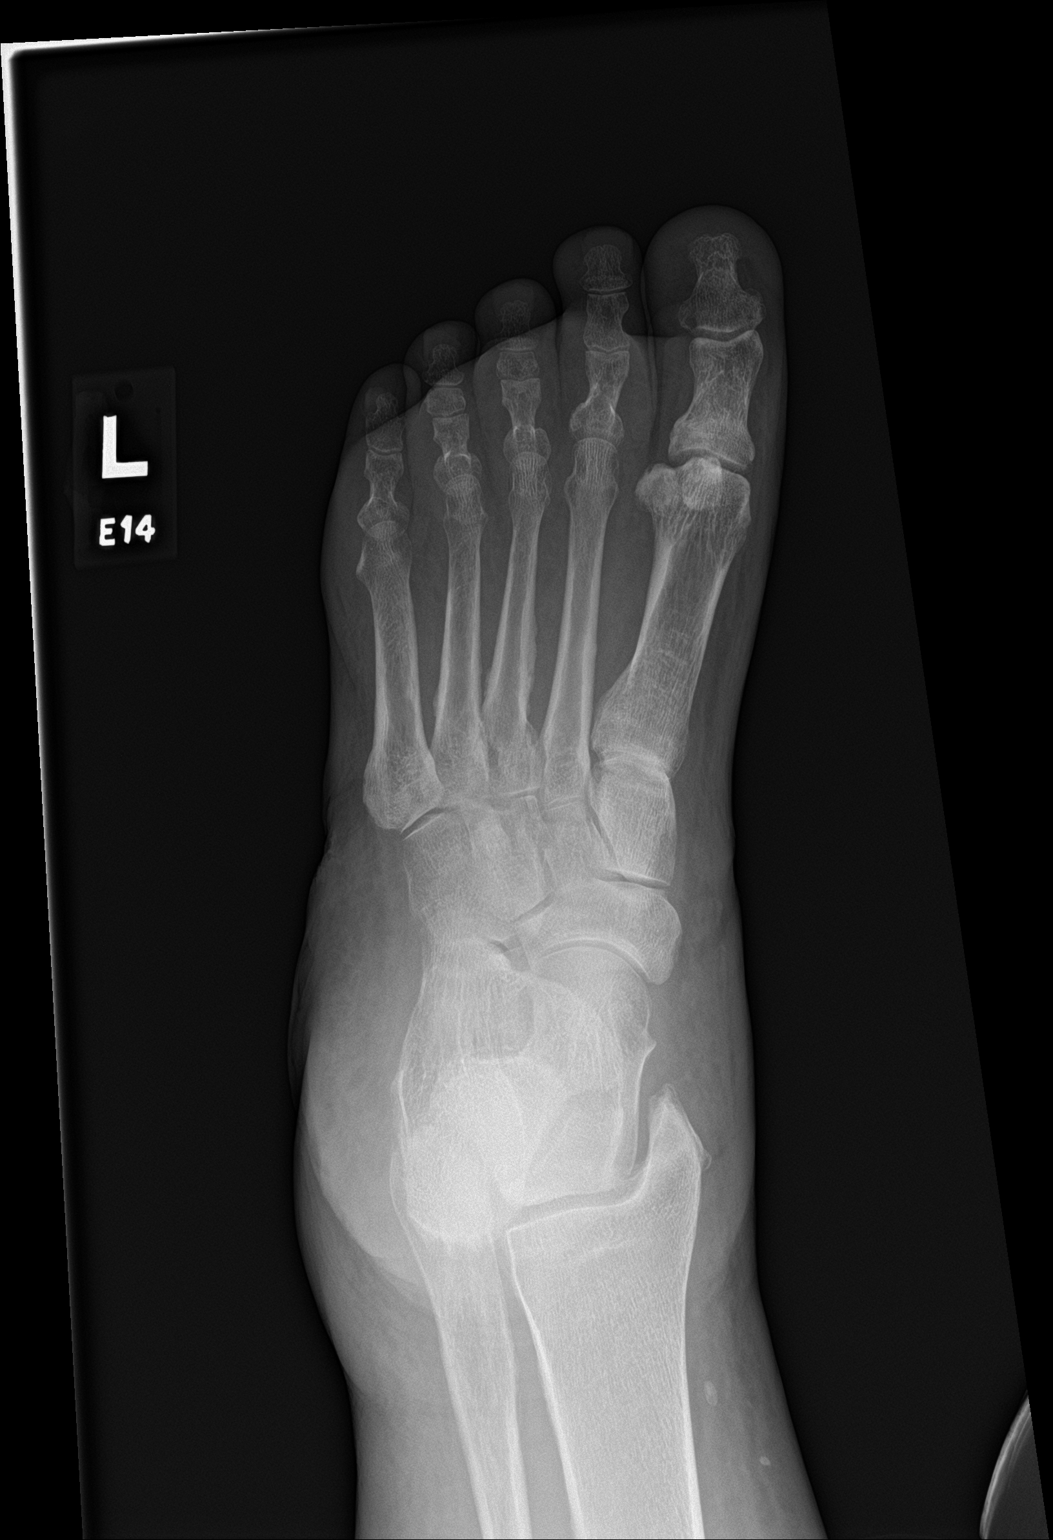

[foot obl]
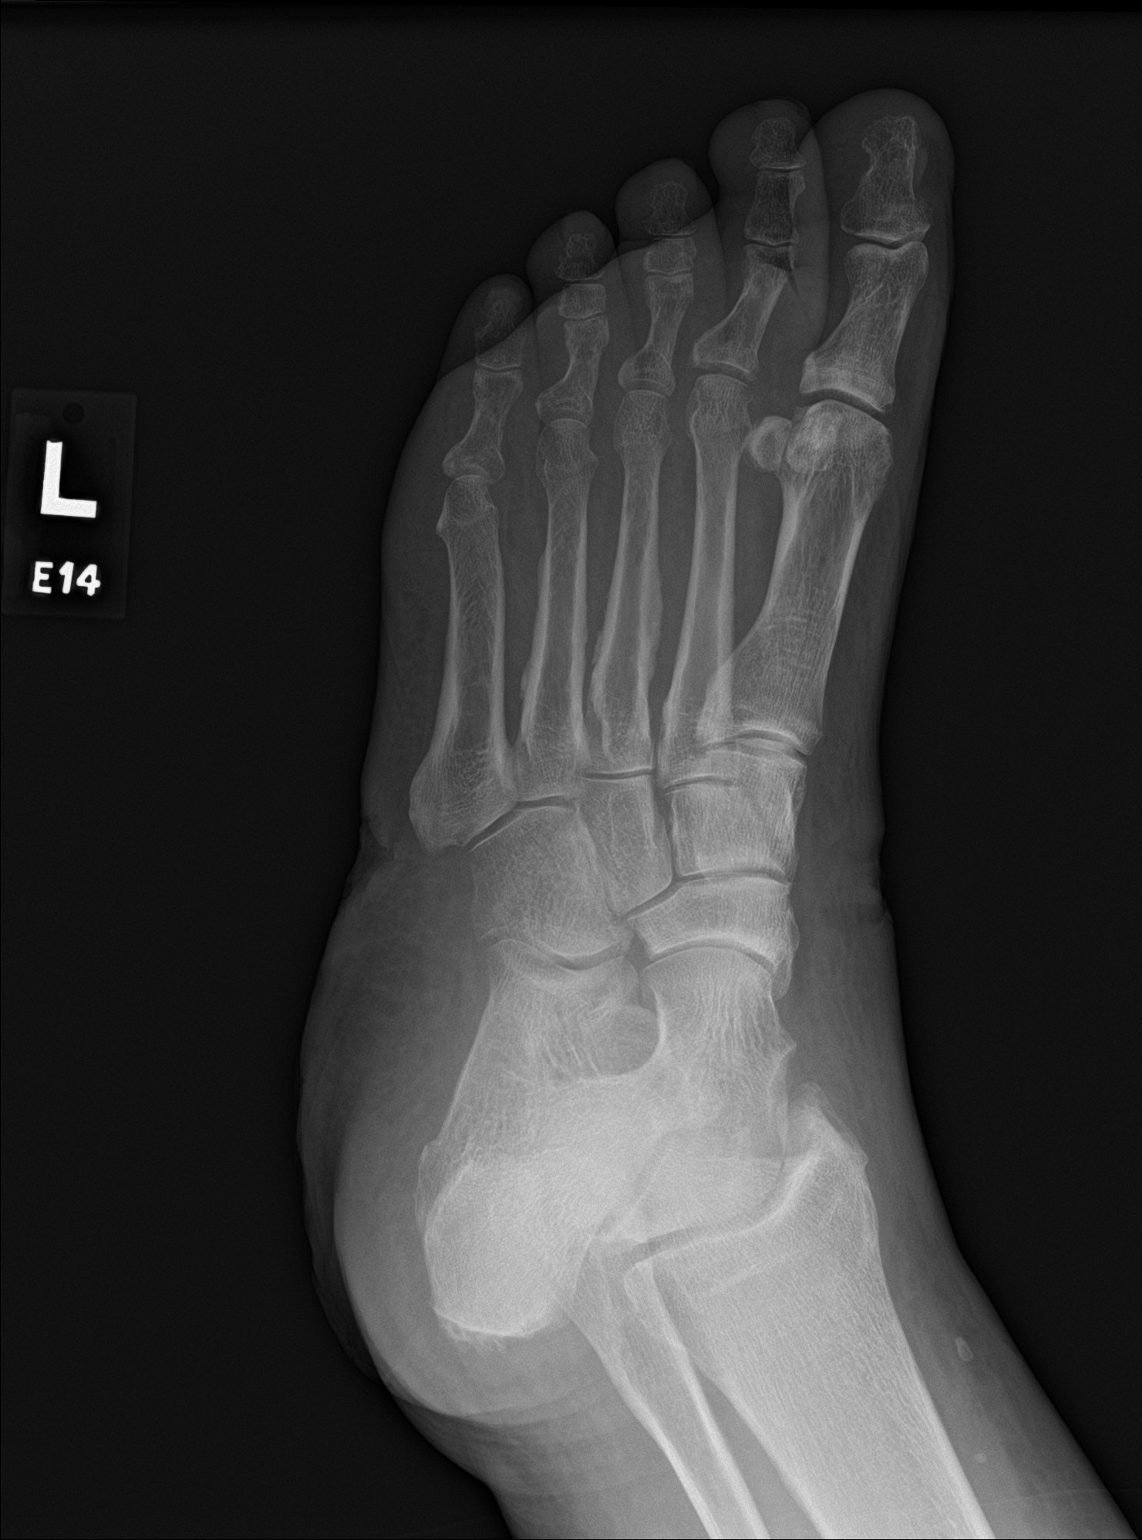

[foot lat]
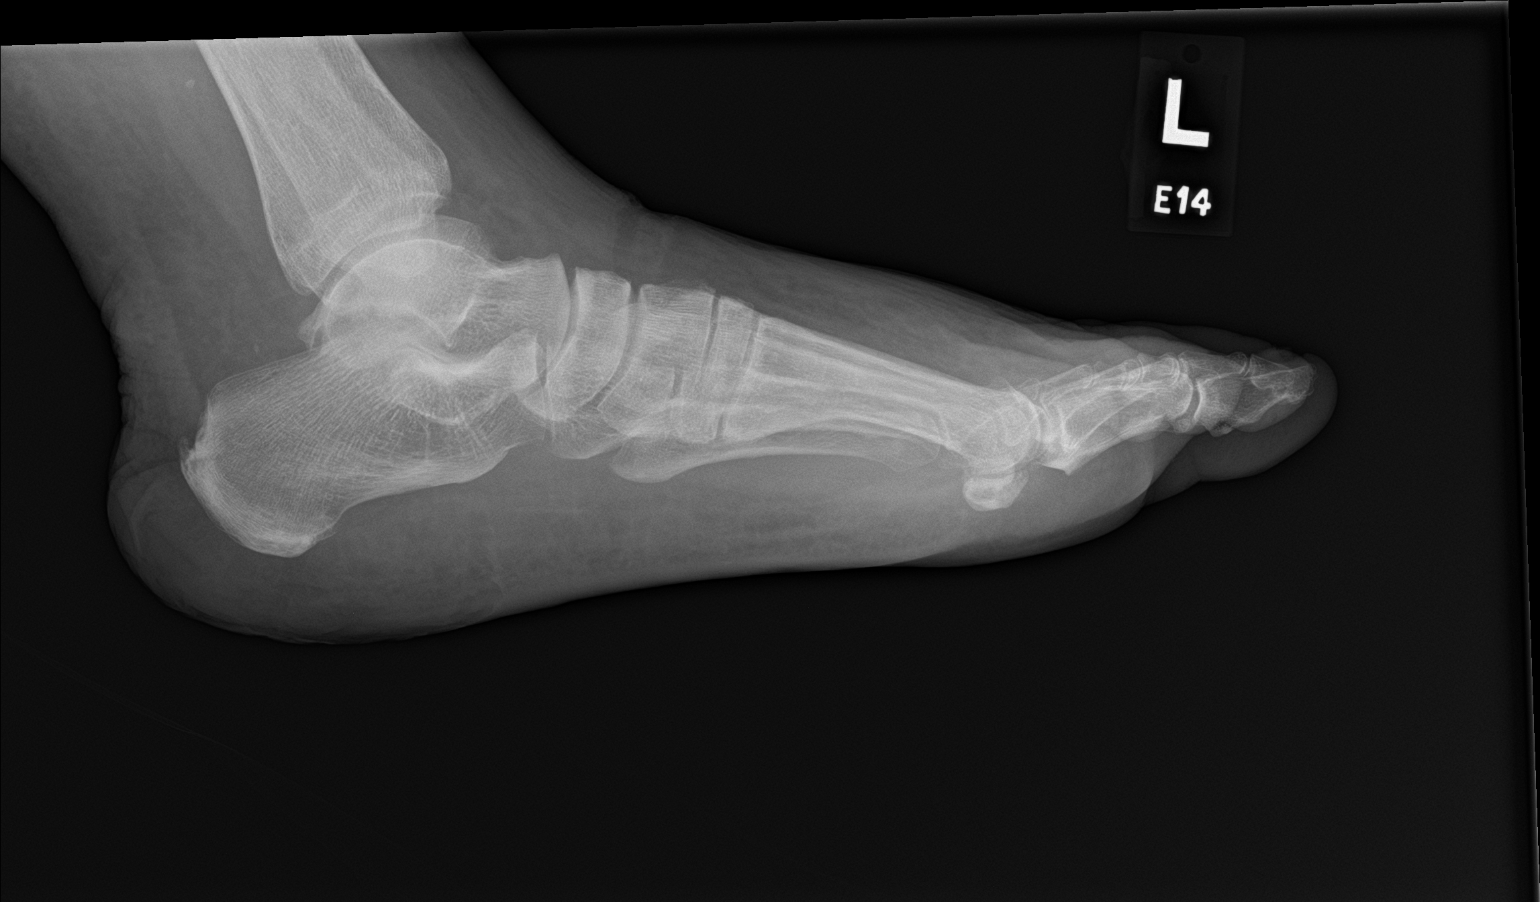

[3 of 3 positions shown; findings below may reference images not displayed]

FINDINGS: There is no evidence of fracture or dislocation. No lytic
destruction is seen to suggest osteomyelitis. There is no evidence
of arthropathy or other focal bone abnormality. Soft tissues are
unremarkable.
IMPRESSION: Negative.

## 2023-04-19 IMAGING — US US RENAL
1 series · 14 of 25 positions shown · non-contrast
Comparison: July 20, 2021

CLINICAL DATA: Acute renal injury.

EXAM:
RENAL / URINARY TRACT ULTRASOUND COMPLETE

[Series 1: us renal · 14 of 34 slices shown]
[im 1/34]
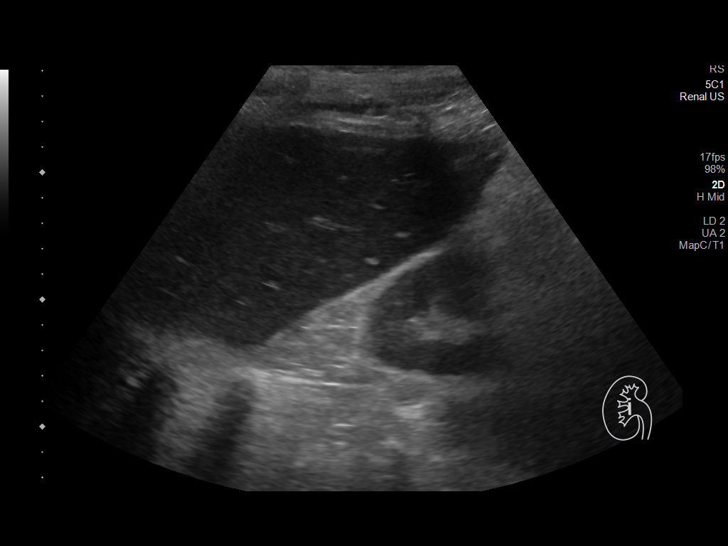
[im 3/34]
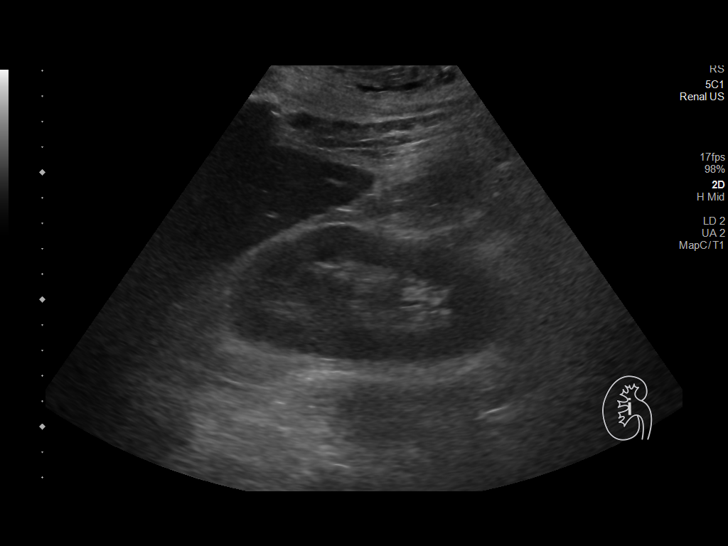
[im 6/34]
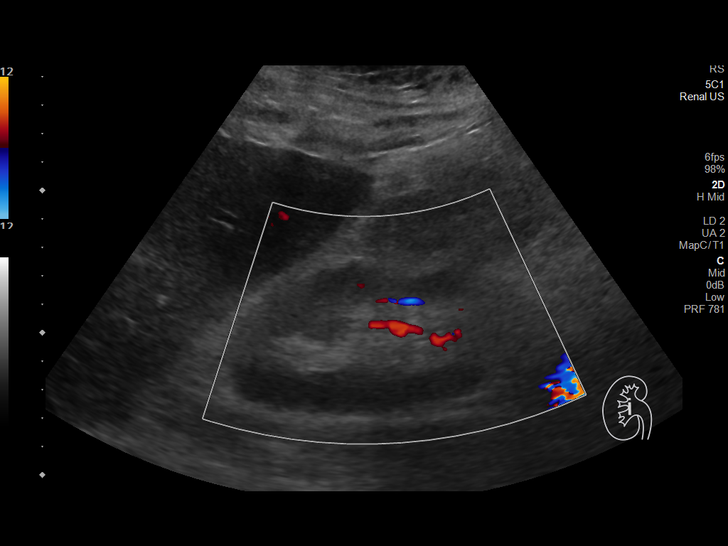
[im 9/34]
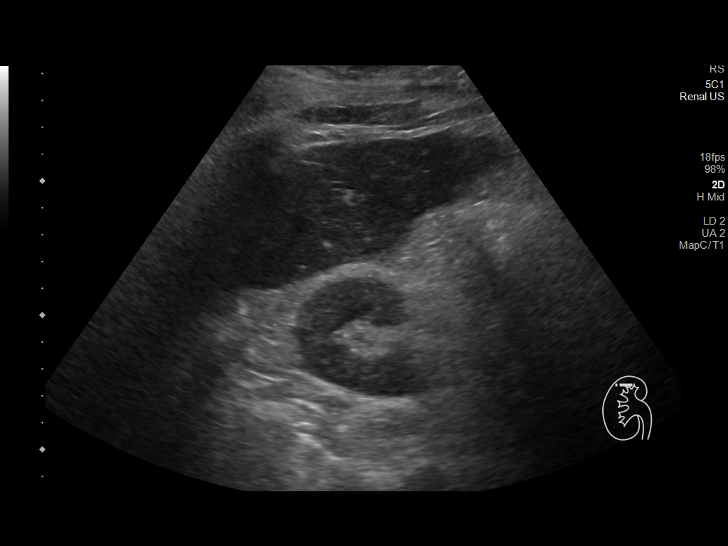
[im 12/34]
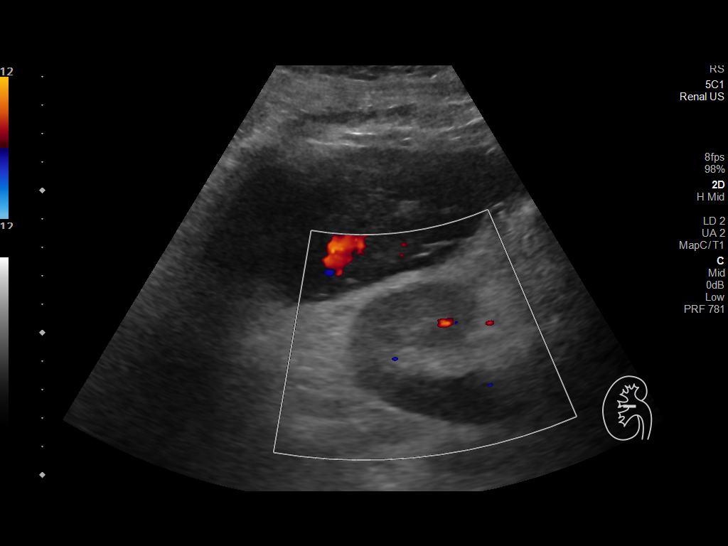
[im 13/34]
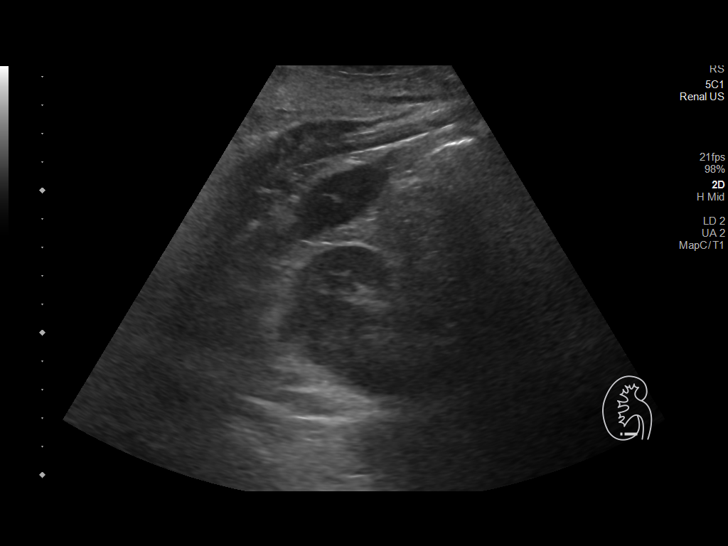
[im 16/34]
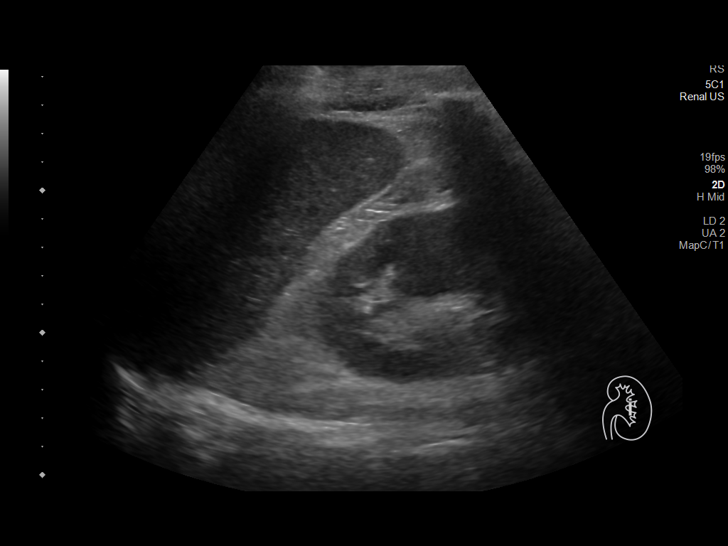
[im 18/34]
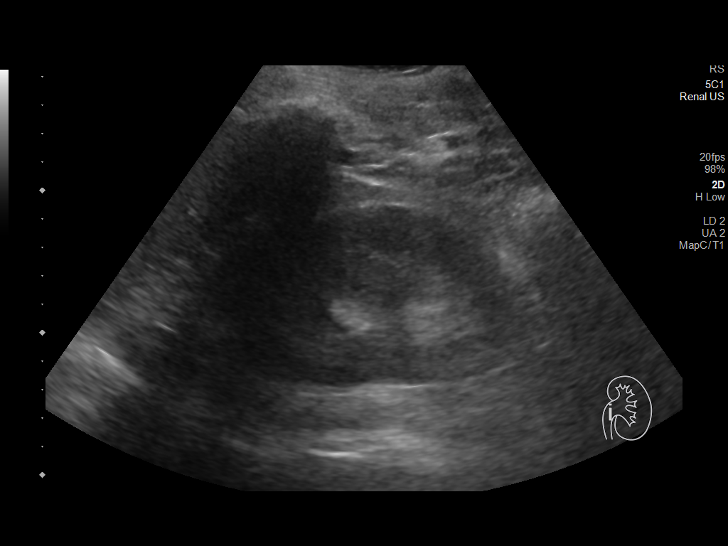
[im 21/34]
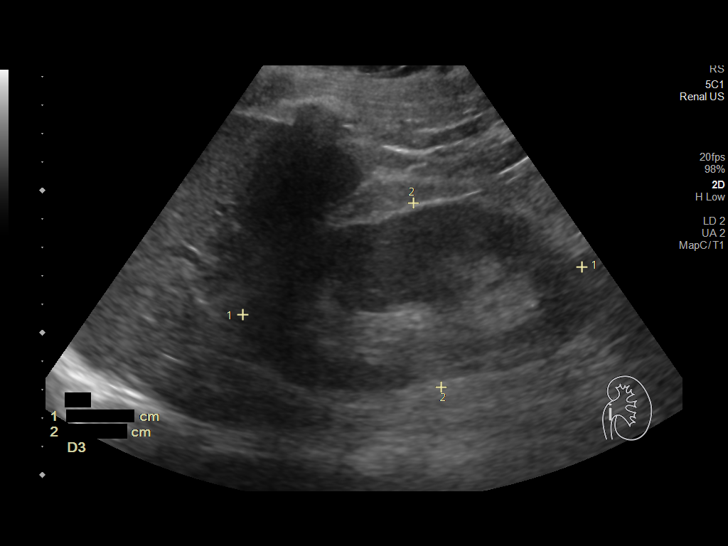
[im 23/34]
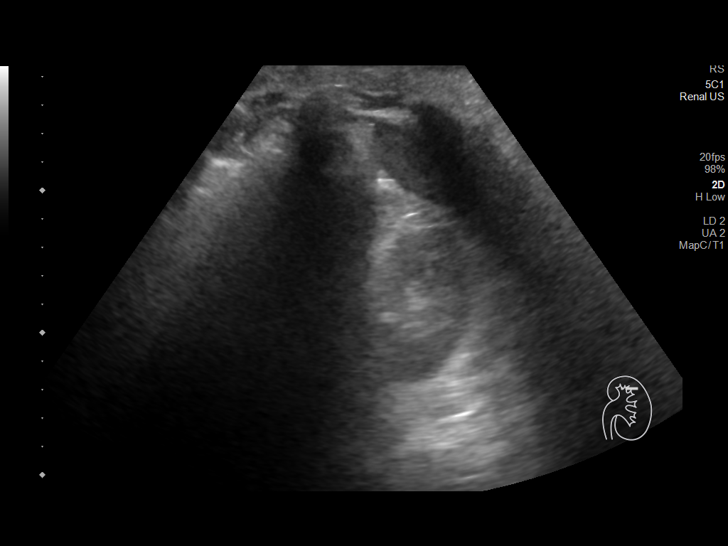
[im 25/34]
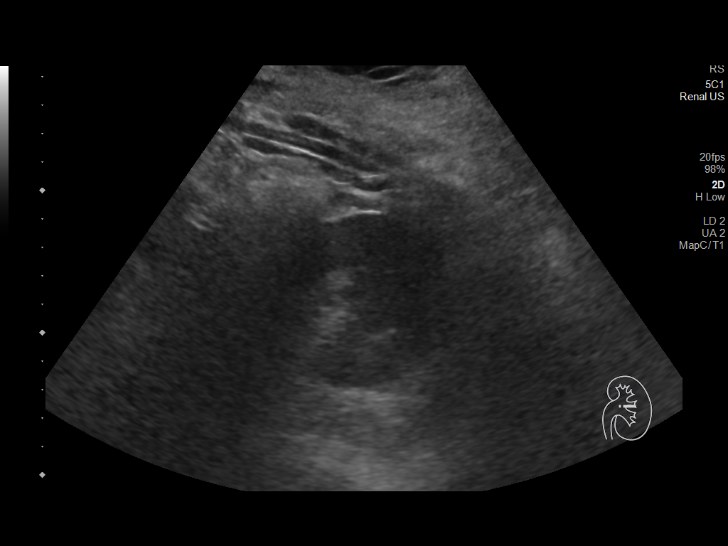
[im 28/34]
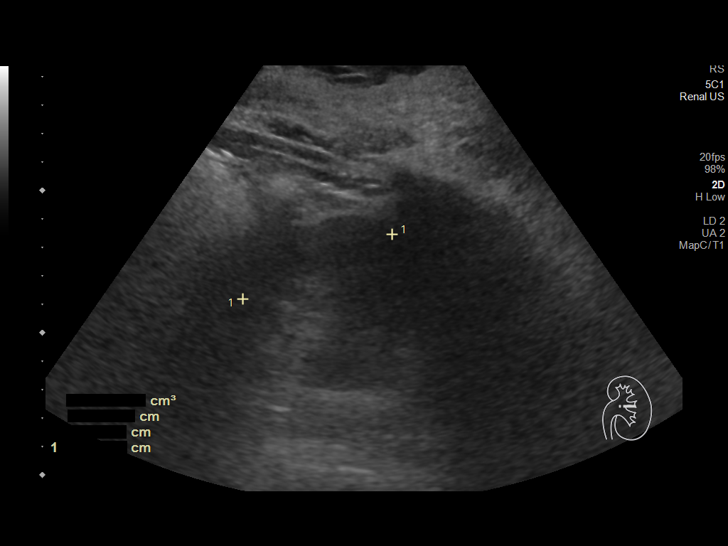
[im 31/34]
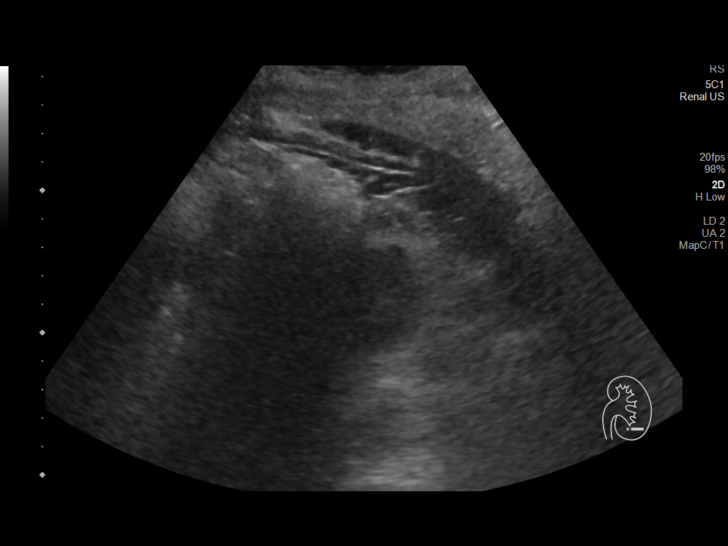
[im 34/34]
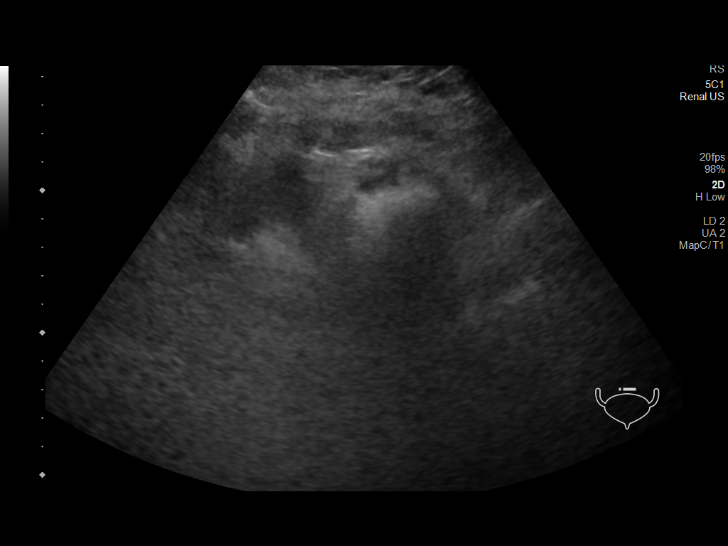

[14 of 25 positions shown; findings below may reference images not displayed]

FINDINGS: Right Kidney:

Renal measurements: 11.2 cm x 5.3 cm x 5.3 cm = volume: 165.7 mL.
Echogenicity within normal limits. No mass or hydronephrosis
visualized.

Left Kidney:

Renal measurements: 12.0 cm x 6.5 cm x 5.1 cm = volume: 235.6 mL.
Echogenicity within normal limits. No mass or hydronephrosis
visualized.

Bladder:

The urinary bladder is empty and subsequently limited in evaluation.

Other:

None.
IMPRESSION: Normal renal ultrasound.

## 2023-04-24 ENCOUNTER — Other Ambulatory Visit (HOSPITAL_COMMUNITY): Payer: Self-pay

## 2023-04-24 MED ORDER — PREDNISONE 20 MG PO TABS
40.0000 mg | ORAL_TABLET | Freq: Every day | ORAL | 3 refills | Status: DC
  Filled 2023-04-24: qty 6, 3d supply, fill #0

## 2023-05-02 ENCOUNTER — Other Ambulatory Visit (HOSPITAL_COMMUNITY): Payer: Self-pay

## 2023-05-06 DIAGNOSIS — R197 Diarrhea, unspecified: Secondary | ICD-10-CM | POA: Insufficient documentation

## 2023-05-08 ENCOUNTER — Other Ambulatory Visit (HOSPITAL_COMMUNITY): Payer: Self-pay

## 2023-05-17 ENCOUNTER — Other Ambulatory Visit (HOSPITAL_COMMUNITY): Payer: Self-pay

## 2023-06-03 DIAGNOSIS — T8743 Infection of amputation stump, right lower extremity: Secondary | ICD-10-CM | POA: Insufficient documentation

## 2023-06-06 DIAGNOSIS — R895 Abnormal microbiological findings in specimens from other organs, systems and tissues: Secondary | ICD-10-CM | POA: Insufficient documentation

## 2023-06-11 IMAGING — CR DG FOOT COMPLETE 3+V*R*
3 series · 3 of 3 positions shown · non-contrast
Comparison: 10/07/2018

CLINICAL DATA: Possible osteomyelitis

EXAM:
RIGHT FOOT COMPLETE - 3+ VIEW

[foot obl (1 of 2)]
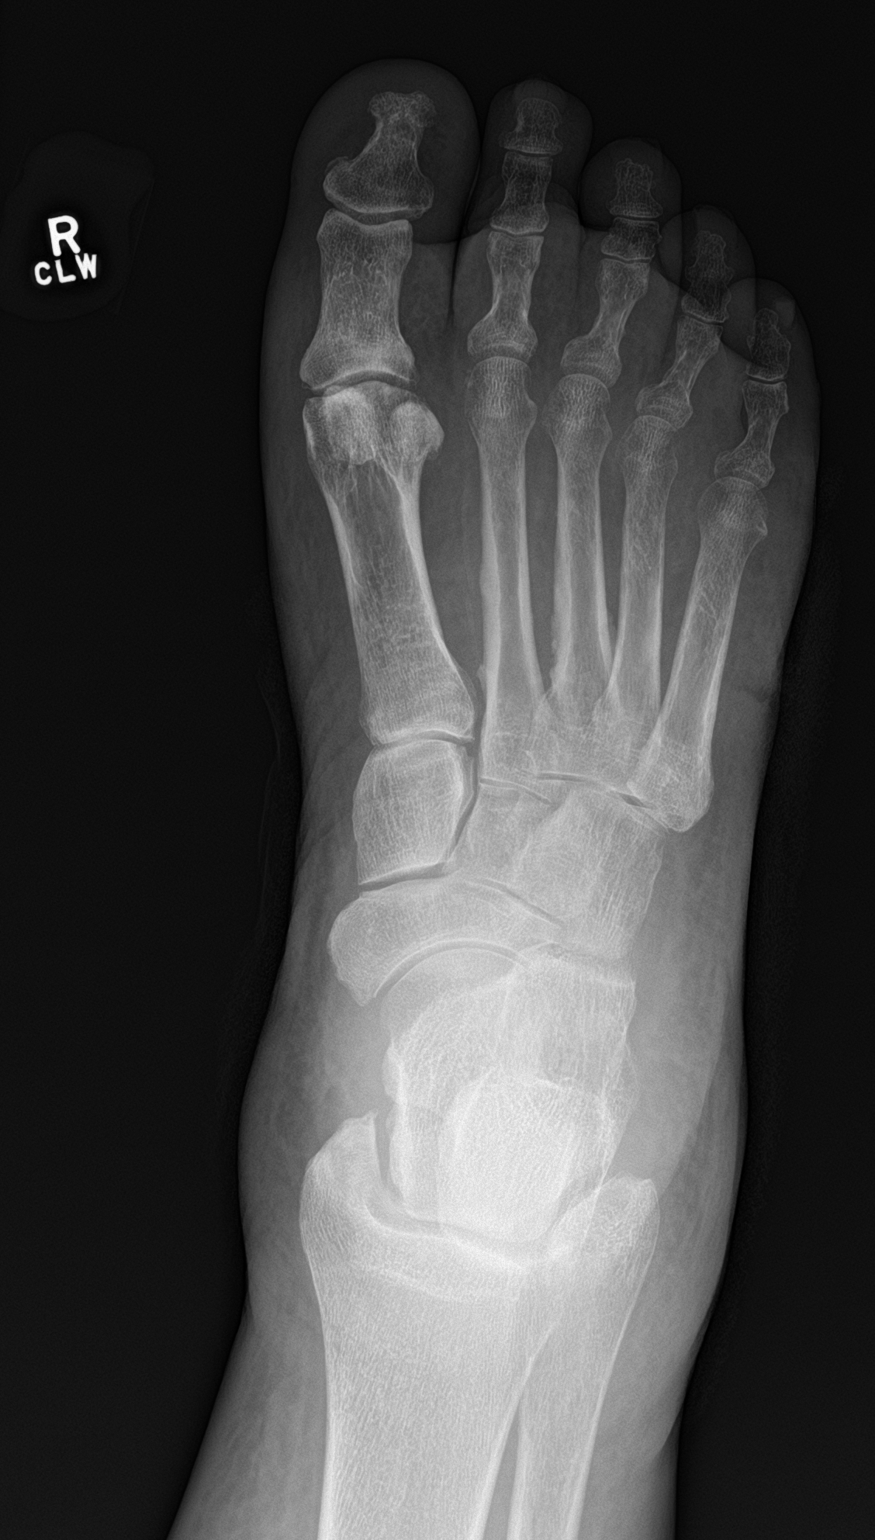

[foot obl (2 of 2)]
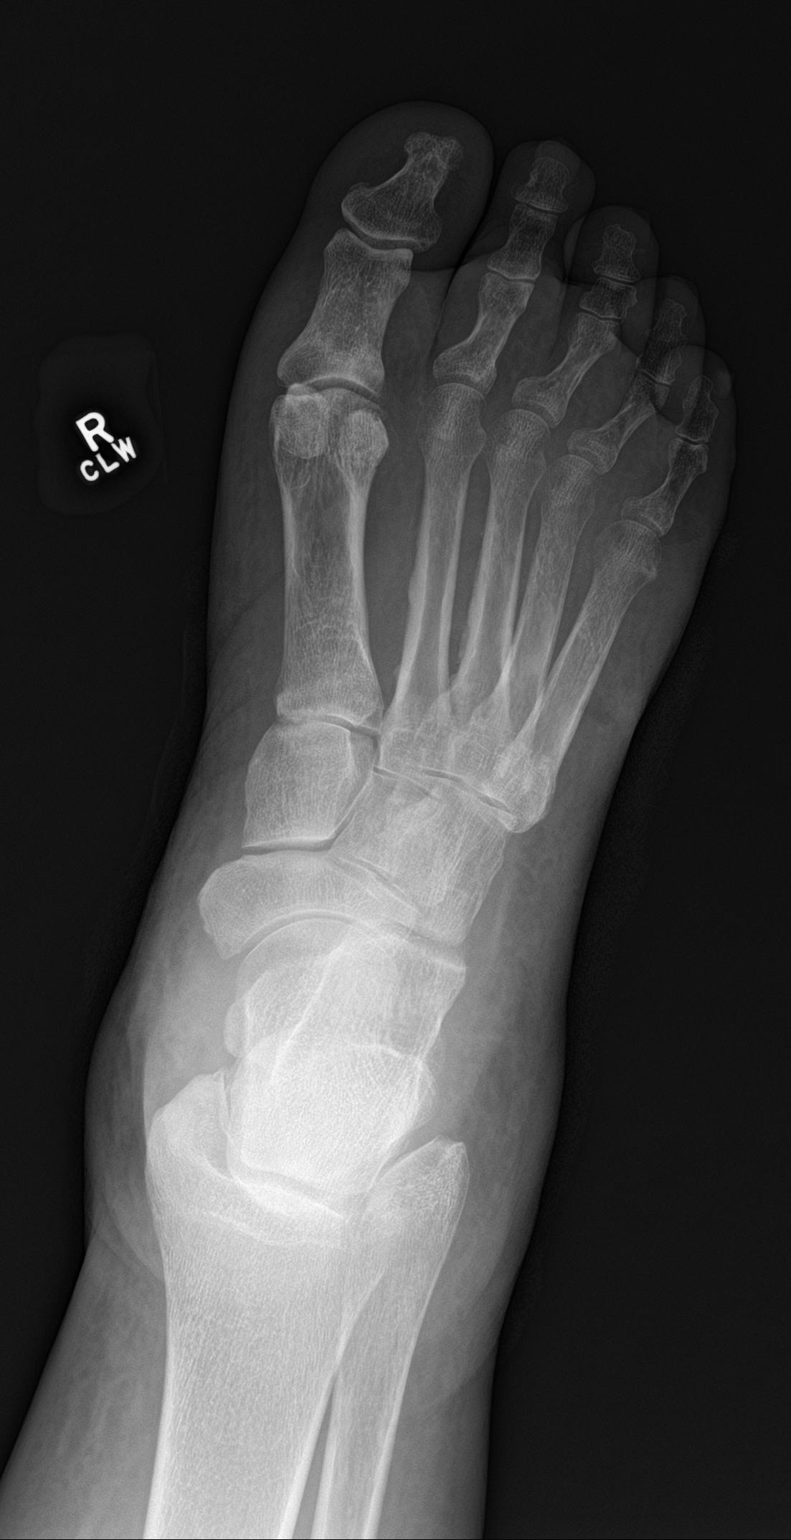

[foot lat]
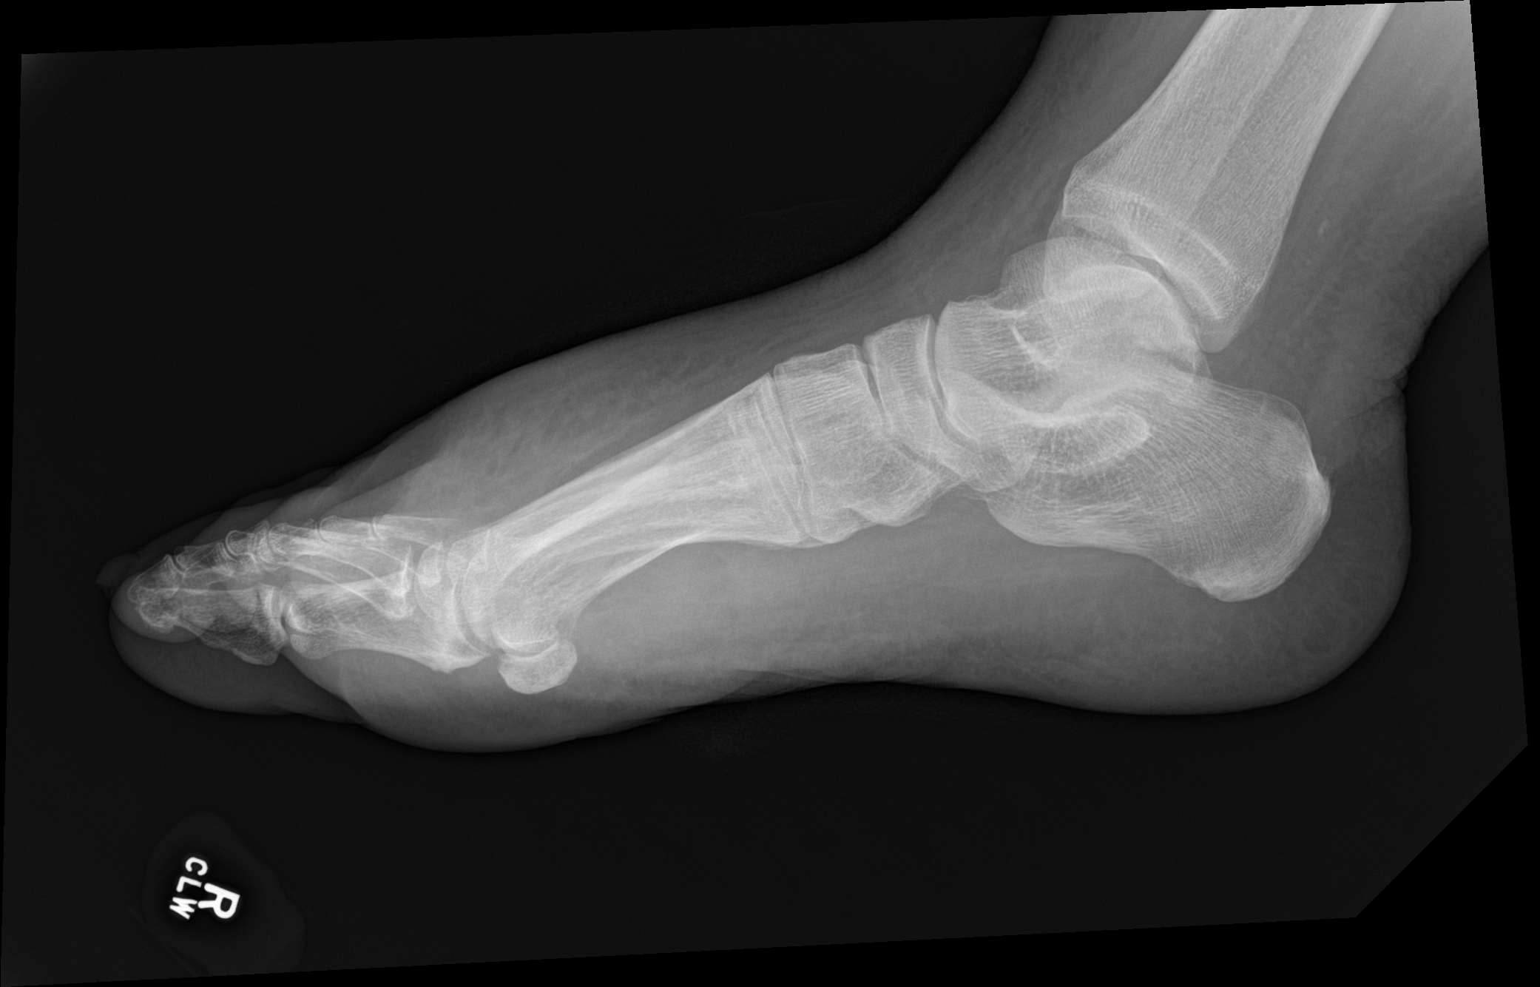

[3 of 3 positions shown; findings below may reference images not displayed]

FINDINGS: Considerable soft tissue swelling is noted particularly in the
region of the metatarsals. No discrete area of bony erosion is
identified to suggest osteomyelitis. No acute fracture or
dislocation is noted.
IMPRESSION: Soft tissue swelling without definitive bony erosive change.

## 2023-06-13 ENCOUNTER — Ambulatory Visit (INDEPENDENT_AMBULATORY_CARE_PROVIDER_SITE_OTHER): Payer: Medicare Other | Admitting: Orthopedic Surgery

## 2023-06-13 ENCOUNTER — Encounter: Payer: Self-pay | Admitting: Orthopedic Surgery

## 2023-06-13 DIAGNOSIS — Z89511 Acquired absence of right leg below knee: Secondary | ICD-10-CM | POA: Diagnosis not present

## 2023-06-13 DIAGNOSIS — Z89512 Acquired absence of left leg below knee: Secondary | ICD-10-CM

## 2023-06-13 NOTE — Progress Notes (Signed)
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  Patient received an antibiotic during dialysis.  Assessment & Plan: Visit Diagnoses: No diagnosis found.  Plan: Prescription is provided for new liner new socket new supplies for the right below-knee amputation.  Recommended trying the prosthetic under liner.  Follow-Up Instructions: Return if symptoms worsen or fail to improve.   Ortho Exam  Patient is alert, oriented, no adenopathy, well-dressed, normal affect, normal respiratory effort. Examination the ulcer on the residual limb on the right is healing well there is no cellulitis drainage or odor.  Prescription is provided for [employer].  Patient is an existing bilateral transtibial  amputee.  Patient's current comorbidities are not expected to impact the ability to function with the prescribed prosthesis. Patient verbally communicates a strong desire to use a prosthesis. Patient currently requires mobility aids to ambulate without a prosthesis.  Expects not to use mobility aids with a new prosthesis.  Patient is a K2 level ambulator that will use a prosthesis to walk around their home and the community over low level environmental barriers.      Imaging: No results found. No images are attached to the encounter.  Labs: Lab Results  Component Value Date   HGBA1C 4.9 11/18/2022   HGBA1C 6.3 (H) 02/16/2022   HGBA1C 5.3 12/23/2021   REPTSTATUS 03/28/2023 FINAL 03/23/2023   REPTSTATUS 03/28/2023 FINAL 03/23/2023   GRAMSTAIN  02/05/2022     RARE WBC PRESENT, PREDOMINANTLY MONONUCLEAR RARE GRAM NEGATIVE RODS Performed at [facility], [address] [place], [place] [address]    CULT  03/23/2023    NO GROWTH 5 DAYS Performed at [facility], [address] [place], [place] [address]    CULT  03/23/2023    NO GROWTH 5 DAYS Performed at [facility], [address] [place], [place] [address]    Cindie Crumbly KOSERI 02/05/2022     Lab Results  Component Value Date   ALBUMIN 3.6 03/24/2023   ALBUMIN 3.7 03/11/2023   ALBUMIN 3.9 02/06/2023   PREALBUMIN 8.4 (L) 04/04/2022    Lab Results  Component Value Date   MG 2.6 (H) 03/24/2023   MG 2.3 11/21/2022   MG 2.0 04/04/2022   Lab Results  Component Value Date   VD25OH 14.28 (L) 04/04/2022    Lab Results  Component Value Date   PREALBUMIN 8.4 (L) 04/04/2022      Latest Ref Rng & Units 03/24/2023    3:28 AM 03/23/2023    1:27 AM 03/22/2023    4:01 PM  CBC EXTENDED  WBC 4.0 - 10.5 K/uL 9.2  13.0  17.9   RBC 3.87 - 5.11 MIL/uL 3.73  3.66  4.02   Hemoglobin 12.0 - 15.0 g/dL 65.7  84.6  96.2   HCT 36.0 - 46.0 % 37.1  34.9  39.0  Platelets 150 - 400 K/uL 230  216  230   NEUT# 1.7 - 7.7 K/uL 4.8     Lymph# 0.7 - 4.0 K/uL 2.5        There is no height or weight on file to calculate BMI.  Orders:  No orders of the defined types were placed in this encounter.  No orders of the defined types were placed in this encounter.    Procedures: No procedures performed  Clinical Data: No additional findings.  ROS:  All other systems negative, except as noted in the HPI. Review of Systems  Objective: Vital Signs: There were no vitals taken for this visit.  Specialty Comments:  No specialty comments available.  PMFS History: Patient Active Problem List   Diagnosis Date Noted   History of CVA (cerebrovascular accident) 03/22/2023   Community acquired pneumonia 03/22/2023   Acute respiratory failure with hypoxia (HCC) 11/04/2022    Pain, unspecified 08/15/2022   Complication of vascular dialysis catheter 08/07/2022   Chest pain 05/30/2022   Fluid overload 05/30/2022   Acute pulmonary edema (HCC) 05/30/2022   Iron deficiency anemia, unspecified 04/26/2022   Moderate protein-calorie malnutrition (HCC) 04/18/2022   Nausea 04/10/2022   Hyponatremia 04/04/2022   Prolonged QT interval 04/04/2022   Anaphylactic shock, unspecified, initial encounter 02/23/2022   Coagulation defect, unspecified (HCC) 02/23/2022   Other allergy, initial encounter 02/23/2022   Pressure injury of skin 02/19/2022   Anemia in chronic kidney disease 02/16/2022   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) 02/16/2022   Hyperlipidemia, unspecified 02/16/2022   Hypertensive heart and chronic kidney disease with heart failure and with stage 5 chronic kidney disease, or end stage renal disease (HCC) 02/16/2022   Personal history of nicotine dependence 02/16/2022   Personal history of transient ischemic attack (TIA), and cerebral infarction without residual deficits 02/16/2022   Secondary hyperparathyroidism of renal origin (HCC) 02/16/2022   Type 2 diabetes mellitus with diabetic peripheral angiopathy without gangrene (HCC) 02/16/2022   Renal anasarca 02/14/2022   ESRD on dialysis (HCC) 02/14/2022   Hyperkalemia 02/14/2022   S/P arteriovenous (AV) fistula creation 12/22/2021   Critical limb ischemia of both lower extremities (HCC) 11/22/2021   Subacute osteomyelitis, right ankle and foot (HCC)    Peripheral artery disease (HCC) 10/27/2021   Acute exacerbation of congestive heart failure (HCC) 10/24/2021   Acute cardiogenic pulmonary edema (HCC) 10/06/2021   CAD (coronary artery disease) 10/06/2021   Goals of care, counseling/discussion    Gangrene of left foot (HCC)    Cellulitis of left foot    Diabetic foot ulcers (HCC) 09/04/2021   CHF (congestive heart failure) (HCC) 07/07/2021   Acute postoperative anemia due to expected blood loss superimposed  on anemia of chronic renal insufficiency 06/30/2021   CKD (chronic kidney disease), stage IV (HCC) 06/11/2021   Chronic diastolic CHF (congestive heart failure) (HCC) 06/10/2021   Abnormal nuclear stress test    Dyspnea on exertion 03/07/2021   Orthopnea 02/25/2021   History of cerebrovascular accident (CVA) with residual deficit 05/06/2020   Diabetic peripheral neuropathy (HCC) 04/26/2020   AKI (acute kidney injury) (HCC) 04/20/2020   Generalized weakness 04/20/2020   Dehydration 04/20/2020   Generalized abdominal pain    Pancreatitis, acute 02/29/2020   Cerebrovascular accident (CVA) (HCC) 11/08/2019   Stenosis of right carotid artery 11/08/2019   Mixed diabetic hyperlipidemia associated with type 2 diabetes mellitus (HCC) 11/08/2019   Cortical age-related cataract of both eyes 08/30/2019   Gait abnormality 08/30/2019   Statin declined 08/30/2019  Gastroesophageal reflux disease without esophagitis 04/18/2018   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   Insulin dependent type 2 diabetes mellitus (HCC) 10/29/2017   History of macular degeneration 10/29/2017   Chronic cervical pain 10/29/2016   Myalgia 09/14/2016   Insomnia 09/14/2016   Tinea pedis of both feet 08/20/2016   Macular degeneration 08/17/2016   Low back pain 09/21/2014   Hypertensive urgency 09/21/2014   Diabetic gastroparesis associated with type 2 diabetes mellitus (HCC) 09/14/2014   HTN (hypertension) 09/08/2014   Thyroid nodule 08/17/2014   Morbid obesity (HCC) 08/17/2014   Hypokalemia 08/06/2014   Type II diabetes mellitus with neurological manifestations, uncontrolled 08/04/2014   Past Medical History:  Diagnosis Date   Anemia 2006   Anginal pain (HCC)    Carotid artery disease (HCC)    right ICA occlusion, 40-59% LICA 02/2021 [place]   CHF (congestive heart failure) (HCC)    Coronary artery disease    Depression 2014   previously on amitryptiline    Diabetes mellitus with neurological manifestation (HCC) 2006   Diabetic  peripheral neuropathy (HCC) 04/26/2020   Dysphagia    Dyspnea    ESRD on hemodialysis (HCC)    Fracture of left ankle 1997   Gastroparesis 07/2016   Heart murmur    HOH (hard of hearing) 2004   Hyperlipidemia 2006   Hypertension 2006   IBS (irritable bowel syndrome) 2002   Leukopenia 2015   Macular degeneration 11/2019   Peripheral vascular disease (HCC)    Shingles 2009   Stroke [facility]) 2020   Thyroid nodule 2004    Family History  Problem Relation Age of Onset   Hypertension Mother    Heart disease Mother    Diabetes Mother    Thyroid disease Mother    Congestive Heart Failure Mother    Breast cancer Maternal Grandmother    Colon cancer Maternal Grandfather    Heart attack Sister    Heart disease Brother    Hyperlipidemia Brother    Hypertension Brother    Diabetes Father    Breast cancer Maternal Aunt     Past Surgical History:  Procedure Laterality Date   A/V FISTULAGRAM Right 09/25/2022   Procedure: A/V Fistulagram;  Surgeon: Nada Libman, MD;  Location: [facility] INVASIVE CV LAB;  Service: Cardiovascular;  Laterality: Right;   ABDOMINAL HYSTERECTOMY  2005   AMPUTATION Bilateral 04/04/2022   Procedure: BILATERAL BELOW KNEE AMPUTATION;  Surgeon: Nadara Mustard, MD;  Location: [facility] OR;  Service: Orthopedics;  Laterality: Bilateral;   AV FISTULA PLACEMENT Right 12/22/2021   Procedure: RIGHT ARM ARTERIOVENOUS (AV) BRACIOCPHELAIC FISTULA CREATION;  Surgeon: Maeola Harman, MD;  Location: [facility] OR;  Service: Vascular;  Laterality: Right;   AV FISTULA PLACEMENT Right 02/19/2022   Procedure: RIGHT ARTERIOVENOUS FISTULA;  Surgeon: Chuck Hint, MD;  Location: [facility] OR;  Service: Vascular;  Laterality: Right;   BASCILIC VEIN TRANSPOSITION Right 08/07/2022   Procedure: SECOND STAGE RIGHT BASILIC VEIN TRANSPOSITION;  Surgeon: Chuck Hint, MD;  Location: [facility] OR;  Service: Vascular;  Laterality: Right;   CATARACT EXTRACTION Left 11/2019   CESAREAN SECTION  1983     CHOLECYSTECTOMY N/A 03/05/2020   Procedure: LAPAROSCOPIC CHOLECYSTECTOMY WITH INTRAOPERATIVE CHOLANGIOGRAM;  Surgeon: Manus Rudd, MD;  Location: [facility] ORS;  Service: General;  Laterality: N/A;   ESOPHAGOGASTRODUODENOSCOPY (EGD) WITH PROPOFOL Left 08/26/2014   Procedure: ESOPHAGOGASTRODUODENOSCOPY (EGD) WITH PROPOFOL;  Surgeon: Willis Modena, MD;  Location: [facility] ENDOSCOPY;  Service: Endoscopy;  Laterality: Left;   ESOPHAGOGASTRODUODENOSCOPY (EGD) WITH PROPOFOL  N/A 03/03/2020   Procedure: ESOPHAGOGASTRODUODENOSCOPY (EGD) WITH PROPOFOL;  Surgeon: Beverley Fiedler, MD;  Location: [facility];  Service: Gastroenterology;  Laterality: N/A;   IR FLUORO GUIDE CV LINE RIGHT  02/15/2022   IR US GUIDE VASC ACCESS RIGHT  02/15/2022   PERIPHERAL VASCULAR BALLOON ANGIOPLASTY Right 09/25/2022   Procedure: PERIPHERAL VASCULAR BALLOON ANGIOPLASTY;  Surgeon: Nada Libman, MD;  Location: [facility];  Service: Cardiovascular;  Laterality: Right;  AVF   RIGHT HEART CATH N/A 07/14/2021   Procedure: RIGHT HEART CATH;  Surgeon: Laurey Morale, MD;  Location: [facility] INVASIVE CV LAB;  Service: Cardiovascular;  Laterality: N/A;   RIGHT/LEFT HEART CATH AND CORONARY ANGIOGRAPHY N/A 04/27/2021   Procedure: RIGHT/LEFT HEART CATH AND CORONARY ANGIOGRAPHY;  Surgeon: Runell Gess, MD;  Location: [facility];  Service: Cardiovascular;  Laterality: N/A;   Social History   Occupational History   Occupation: Employed FT as Warden/ranger    Comment: [place] , NA  Tobacco Use   Smoking status: Former    Packs/day: 0.25    Years: 0.50    Additional pack years: 0.00    Total pack years: 0.13    Types: Cigarettes    Passive exposure: Never   Smokeless tobacco: Never   Tobacco comments:    Former smoker 11/03/21  Vaping Use   Vaping Use: Never used  Substance and Sexual Activity   Alcohol use: No   Drug use: No   Sexual activity: Not Currently    Birth control/protection: None

## 2023-07-02 ENCOUNTER — Other Ambulatory Visit (HOSPITAL_COMMUNITY): Payer: Self-pay | Admitting: Otolaryngology

## 2023-07-02 ENCOUNTER — Encounter (HOSPITAL_BASED_OUTPATIENT_CLINIC_OR_DEPARTMENT_OTHER): Payer: Medicare Other | Admitting: Internal Medicine

## 2023-07-02 DIAGNOSIS — N186 End stage renal disease: Secondary | ICD-10-CM

## 2023-07-02 DIAGNOSIS — K118 Other diseases of salivary glands: Secondary | ICD-10-CM

## 2023-07-15 ENCOUNTER — Other Ambulatory Visit (HOSPITAL_COMMUNITY): Payer: Self-pay

## 2023-07-15 ENCOUNTER — Other Ambulatory Visit: Payer: Self-pay | Admitting: Internal Medicine

## 2023-07-16 ENCOUNTER — Other Ambulatory Visit (HOSPITAL_COMMUNITY): Payer: Self-pay

## 2023-07-16 NOTE — Telephone Encounter (Signed)
Requested medication (s) are due for refill today:   Requested medication (s) are on the active medication list: Yes  Last refill:    Future visit scheduled: No  Notes to clinic:  Called pt. To make appointment, disconnected line.    Requested Prescriptions  Pending Prescriptions Disp Refills   isosorbide mononitrate (IMDUR) 30 MG 24 hr tablet 28 tablet 0    Sig: Take 2 tablets (60 mg total) by mouth daily for blood pressure     Cardiovascular:  Nitrates Failed - 07/15/2023  9:02 AM      Failed - Last BP in normal range    BP Readings from Last 1 Encounters:  03/24/23 (!) 162/67         Failed - Valid encounter within last 12 months    Recent Outpatient Visits           1 year ago Hospital discharge follow-up   [facility] Marcine Matar, MD   1 year ago Hospital discharge follow-up   [facility] Marcine Matar, MD   2 years ago Hospital discharge follow-up   [facility] Jonah Blue B, MD   3 years ago Uncontrolled type 2 diabetes mellitus with diabetic neuropathy, with long-term current use of insulin [facility])   [facility] & [facility] [place], [place] J, NP   3 years ago Hospital discharge follow-up   [facility] Marcine Matar, MD              Passed - Last Heart Rate in normal range    Pulse Readings from Last 1 Encounters:  03/24/23 80          ondansetron (ZOFRAN) 8 MG tablet 10 tablet 0    Sig: Take 1 tablet (8 mg total) by mouth every 12 (twelve) hours as needed for nausea and vomiting, may crush or dissvole in 5ml water if needed     Not Delegated - Gastroenterology: Antiemetics - ondansetron Failed - 07/15/2023  9:02 AM      Failed - This refill cannot be delegated      Failed - Valid encounter within last 6 months    Recent Outpatient Visits           1 year ago Hospital  discharge follow-up   [facility] Marcine Matar, MD   1 year ago Hospital discharge follow-up   [facility] Marcine Matar, MD   2 years ago Hospital discharge follow-up   [facility] & [facility] Jonah Blue B, MD   3 years ago Uncontrolled type 2 diabetes mellitus with diabetic neuropathy, with long-term current use of insulin [facility])   [facility] [place], [place], NP   3 years ago Hospital discharge follow-up   [facility] & [facility] Marcine Matar, MD              Passed - AST in normal range and within 360 days    AST  Date Value Ref Range Status  03/24/2023 19 15 - 41 U/L Final    Comment:    HEMOLYSIS AT THIS LEVEL MAY AFFECT RESULT         Passed - ALT in normal range and within 360 days    ALT  Date  Value Ref Range Status  03/24/2023 14 0 - 44 U/L Final    Comment:    HEMOLYSIS AT THIS LEVEL MAY AFFECT RESULT

## 2023-07-22 ENCOUNTER — Other Ambulatory Visit (HOSPITAL_COMMUNITY): Payer: Self-pay

## 2023-07-22 MED ORDER — AMLODIPINE BESYLATE 10 MG PO TABS
10.0000 mg | ORAL_TABLET | Freq: Every day | ORAL | 3 refills | Status: DC
  Filled 2023-07-22: qty 90, 90d supply, fill #0

## 2023-07-23 ENCOUNTER — Other Ambulatory Visit: Payer: Self-pay | Admitting: Internal Medicine

## 2023-07-23 ENCOUNTER — Other Ambulatory Visit (HOSPITAL_COMMUNITY): Payer: Self-pay

## 2023-07-24 ENCOUNTER — Encounter (HOSPITAL_COMMUNITY): Payer: Self-pay

## 2023-07-24 ENCOUNTER — Other Ambulatory Visit: Payer: Self-pay

## 2023-07-24 ENCOUNTER — Other Ambulatory Visit (HOSPITAL_COMMUNITY): Payer: Self-pay

## 2023-07-24 ENCOUNTER — Emergency Department (HOSPITAL_COMMUNITY)
Admission: EM | Admit: 2023-07-24 | Discharge: 2023-07-24 | Disposition: A | Discharge status: Home / Self Care | Payer: Medicare Other | Attending: Emergency Medicine | Admitting: Emergency Medicine

## 2023-07-24 ENCOUNTER — Emergency Department (HOSPITAL_COMMUNITY): Payer: Medicare Other

## 2023-07-24 DIAGNOSIS — Z992 Dependence on renal dialysis: Secondary | ICD-10-CM | POA: Insufficient documentation

## 2023-07-24 DIAGNOSIS — R0602 Shortness of breath: Secondary | ICD-10-CM | POA: Diagnosis present

## 2023-07-24 DIAGNOSIS — D72829 Elevated white blood cell count, unspecified: Secondary | ICD-10-CM | POA: Insufficient documentation

## 2023-07-24 DIAGNOSIS — Z79899 Other long term (current) drug therapy: Secondary | ICD-10-CM | POA: Diagnosis not present

## 2023-07-24 DIAGNOSIS — R197 Diarrhea, unspecified: Secondary | ICD-10-CM | POA: Insufficient documentation

## 2023-07-24 DIAGNOSIS — I132 Hypertensive heart and chronic kidney disease with heart failure and with stage 5 chronic kidney disease, or end stage renal disease: Secondary | ICD-10-CM | POA: Insufficient documentation

## 2023-07-24 DIAGNOSIS — Z794 Long term (current) use of insulin: Secondary | ICD-10-CM | POA: Diagnosis not present

## 2023-07-24 DIAGNOSIS — Z1152 Encounter for screening for COVID-19: Secondary | ICD-10-CM | POA: Diagnosis not present

## 2023-07-24 DIAGNOSIS — R110 Nausea: Secondary | ICD-10-CM | POA: Insufficient documentation

## 2023-07-24 DIAGNOSIS — I2510 Atherosclerotic heart disease of native coronary artery without angina pectoris: Secondary | ICD-10-CM | POA: Diagnosis not present

## 2023-07-24 DIAGNOSIS — N186 End stage renal disease: Secondary | ICD-10-CM | POA: Diagnosis not present

## 2023-07-24 DIAGNOSIS — J069 Acute upper respiratory infection, unspecified: Secondary | ICD-10-CM | POA: Insufficient documentation

## 2023-07-24 DIAGNOSIS — R051 Acute cough: Secondary | ICD-10-CM

## 2023-07-24 DIAGNOSIS — Z7982 Long term (current) use of aspirin: Secondary | ICD-10-CM | POA: Insufficient documentation

## 2023-07-24 DIAGNOSIS — Z8673 Personal history of transient ischemic attack (TIA), and cerebral infarction without residual deficits: Secondary | ICD-10-CM | POA: Insufficient documentation

## 2023-07-24 DIAGNOSIS — I509 Heart failure, unspecified: Secondary | ICD-10-CM | POA: Diagnosis not present

## 2023-07-24 DIAGNOSIS — E1122 Type 2 diabetes mellitus with diabetic chronic kidney disease: Secondary | ICD-10-CM | POA: Diagnosis not present

## 2023-07-24 LAB — CBC WITH DIFFERENTIAL/PLATELET
Abs Immature Granulocytes: 0.04 10*3/uL (ref 0.00–0.07)
Basophils Absolute: 0.1 10*3/uL (ref 0.0–0.1)
Basophils Relative: 1 %
Eosinophils Absolute: 0.4 10*3/uL (ref 0.0–0.5)
Eosinophils Relative: 3 %
HCT: 36.6 % (ref 36.0–46.0)
Hemoglobin: 11.3 g/dL — ABNORMAL LOW (ref 12.0–15.0)
Immature Granulocytes: 0 %
Lymphocytes Relative: 13 %
Lymphs Abs: 1.5 10*3/uL (ref 0.7–4.0)
MCH: 28.9 pg (ref 26.0–34.0)
MCHC: 30.9 g/dL (ref 30.0–36.0)
MCV: 93.6 fL (ref 80.0–100.0)
Monocytes Absolute: 1.2 10*3/uL — ABNORMAL HIGH (ref 0.1–1.0)
Monocytes Relative: 11 %
Neutro Abs: 8.3 10*3/uL — ABNORMAL HIGH (ref 1.7–7.7)
Neutrophils Relative %: 72 %
Platelets: 223 10*3/uL (ref 150–400)
RBC: 3.91 MIL/uL (ref 3.87–5.11)
RDW: 15 % (ref 11.5–15.5)
WBC: 11.4 10*3/uL — ABNORMAL HIGH (ref 4.0–10.5)
nRBC: 0 % (ref 0.0–0.2)

## 2023-07-24 LAB — COMPREHENSIVE METABOLIC PANEL
ALT: 13 U/L (ref 0–44)
AST: 12 U/L — ABNORMAL LOW (ref 15–41)
Albumin: 3.2 g/dL — ABNORMAL LOW (ref 3.5–5.0)
Alkaline Phosphatase: 81 U/L (ref 38–126)
Anion gap: 19 — ABNORMAL HIGH (ref 5–15)
BUN: 27 mg/dL — ABNORMAL HIGH (ref 8–23)
CO2: 23 mmol/L (ref 22–32)
Calcium: 7.9 mg/dL — ABNORMAL LOW (ref 8.9–10.3)
Chloride: 91 mmol/L — ABNORMAL LOW (ref 98–111)
Creatinine, Ser: 6.57 mg/dL — ABNORMAL HIGH (ref 0.44–1.00)
GFR, Estimated: 7 mL/min — ABNORMAL LOW (ref >60)
Glucose, Bld: 125 mg/dL — ABNORMAL HIGH (ref 70–99)
Potassium: 4.2 mmol/L (ref 3.5–5.1)
Sodium: 133 mmol/L — ABNORMAL LOW (ref 135–145)
Total Bilirubin: 0.8 mg/dL (ref 0.3–1.2)
Total Protein: 7.4 g/dL (ref 6.5–8.1)

## 2023-07-24 LAB — SARS CORONAVIRUS 2 BY RT PCR: SARS Coronavirus 2 by RT PCR: NEGATIVE

## 2023-07-24 MED ORDER — BENZONATATE 100 MG PO CAPS
100.0000 mg | ORAL_CAPSULE | Freq: Three times a day (TID) | ORAL | 0 refills | Status: DC | PRN
Start: 2023-07-24 — End: 2023-12-21
  Filled 2023-07-25: qty 21, 7d supply, fill #0

## 2023-07-24 MED ORDER — ONDANSETRON 4 MG PO TBDP
4.0000 mg | ORAL_TABLET | Freq: Three times a day (TID) | ORAL | 0 refills | Status: DC | PRN
  Filled 2023-07-25: qty 20, 7d supply, fill #0

## 2023-07-24 NOTE — ED Triage Notes (Signed)
Pt bib home with complaints of increased shob. Pt has been feeling flu sx with fatigue, chills, and productive cough x a couple days. Pt is on 2LNC at baseline. Pt has been taking tylenol with some relief of sx. Pt is a dialysis pt on M/W/F but did not make it today.

## 2023-07-24 NOTE — ED Provider Notes (Signed)
[place] EMERGENCY DEPARTMENT AT [facility] Provider Note   CSN: [id number] Arrival date & time: 07/24/23  1155     History  Chief Complaint  Patient presents with   Shortness of Breath    Nancy [name] is a 62 y.o. female.  The history is provided by the patient and medical records. No language interpreter was used.  Shortness of Breath Severity:  Mild Onset quality:  Gradual Duration:  3 days Timing:  Constant Progression:  Waxing and waning Context: URI   Relieved by:  Nothing Worsened by:  Coughing Ineffective treatments:  None tried Associated symptoms: cough, fever (sujective), sputum production and vomiting   Associated symptoms: no abdominal pain, no chest pain, no diaphoresis, no headaches, no neck pain, no rash and no wheezing        Home Medications Prior to Admission medications   Medication Sig Start Date End Date Taking? Authorizing Provider  amLODipine (NORVASC) 10 MG tablet Take 1 tablet (10 mg total) by mouth daily. 06/04/22   Burnadette Pop, MD  amLODipine (NORVASC) 10 MG tablet Take 1 tablet (10 mg total) by mouth daily for hypertension 04/01/23     amLODipine (NORVASC) 10 MG tablet Take 1 tablet (10 mg total) by mouth daily as directed. DO NOT TAKE ON DIALYSIS DAYS, WAIT UNTIL AFTER TREATMENT 07/22/23     aspirin 81 MG EC tablet Take 1 tablet (81 mg total) by mouth daily. 09/26/21   Milford, Anderson Malta, FNP  atorvastatin (LIPITOR) 40 MG tablet Take 1 tablet (40 mg total) by mouth daily. 11/07/21   Laurey Morale, MD  atorvastatin (LIPITOR) 40 MG tablet Take 1 tablet (40 mg total) by mouth daily for antilipids 04/01/23     Blood Glucose Monitoring Suppl (ONETOUCH VERIO) w/Device KIT Check blood sugar three times daily. E11.40 05/06/20   Marcine Matar, MD  Continuous Blood Gluc Receiver (DEXCOM G6 RECEIVER) DEVI 1 Device by Does not apply route daily. 11/15/21   Marcine Matar, MD  Continuous Blood Gluc Sensor (DEXCOM G6 SENSOR)  MISC 1 packet by Does not apply route daily. 11/15/21   Marcine Matar, MD  Continuous Blood Gluc Transmit (DEXCOM G6 TRANSMITTER) MISC 1 packet by Does not apply route daily. 11/15/21   Marcine Matar, MD  DM-Doxylamine-Acetaminophen (NYQUIL HBP COLD & FLU) 15-6.25-325 MG/15ML LIQD Take 30 mLs by mouth every 8 (eight) hours as needed (Cold).    [provider]  ezetimibe (ZETIA) 10 MG tablet Take 1 tablet (10 mg total) by mouth daily. 04/11/22   Danford, Earl Lites, MD  ezetimibe (ZETIA) 10 MG tablet Take 1 tablet (10 mg total) by mouth daily for antilipids 04/01/23     ferrous sulfate (FEROSUL) 325 (65 FE) MG tablet Take 1 tablet (325 mg total) by mouth daily for supplementation 04/01/23     ferrous sulfate 325 (65 FE) MG tablet Take 1 tablet (325 mg total) by mouth daily with breakfast. 11/07/21   Marcine Matar, MD  glucose blood test strip Use as instructedCheck blood sugar three times daily. E11.40 07/11/20   Marcine Matar, MD  HYDROmorphone (DILAUDID) 2 MG tablet Take 1 tablet (2 mg total) by mouth every 4 (four) hours as needed for pain. Max daily dose of 6 tablets. 04/01/23     Insulin Pen Needle 32G X 4 MM MISC Use to inject insulin as directed. 11/07/21   Marcine Matar, MD  isosorbide mononitrate (IMDUR) 30 MG 24 hr tablet Take  1 tablet (30 mg total) by mouth daily. Patient taking differently: Take 60 mg by mouth daily. 06/04/22   Burnadette Pop, MD  isosorbide mononitrate (IMDUR) 30 MG 24 hr tablet Take 2 tablets (60 mg total) by mouth daily for blood pressure 04/01/23     labetalol (NORMODYNE) 300 MG tablet Take 1 tablet (300 mg total) by mouth 2 (two) times daily. 06/04/22   Burnadette Pop, MD  labetalol (NORMODYNE) 300 MG tablet Take 1 tablet (300 mg total) by mouth 2 (two) times daily for beta blockers 04/01/23     levothyroxine (SYNTHROID) 25 MCG tablet Take 25 mcg by mouth daily. 03/11/23   [provider]  levothyroxine (SYNTHROID) 25 MCG tablet  Take 1 tablet (25 mcg total) by mouth daily for low thyroid hormone 04/01/23     Multiple Vitamin (MULTI-VITAMIN DAILY) TABS Take 1 tablet by mouth at bedtime for supplementation 04/01/23     Multiple Vitamin (MULTIVITAMIN) tablet Take 1 tablet by mouth at bedtime.    [provider]  omeprazole (PRILOSEC) 20 MG capsule Take 40 mg by mouth daily. 06/24/19   [provider]  omeprazole (PRILOSEC) 20 MG capsule Take 2 capsules (40 mg total) by mouth daily for GERD 04/01/23     ondansetron (ZOFRAN) 8 MG tablet Take 1 tablet (8 mg total) by mouth every 8 (eight) hours as needed for nausea Patient taking differently: Take 8 mg by mouth every 12 (twelve) hours as needed for nausea or vomiting. 05/23/22     ondansetron (ZOFRAN) 8 MG tablet Take 1 tablet (8 mg total) by mouth every 12 (twelve) hours as needed for nausea and vomiting, may crush or dissvole in 5ml water if needed 04/01/23     pantoprazole (PROTONIX) 40 MG tablet Take 1 tablet (40 mg total) by mouth daily. Patient not taking: Reported on 03/23/2023 04/11/22   Alberteen Sam, MD  polyethylene glycol (MIRALAX / GLYCOLAX) 17 g packet Take 17 g by mouth daily. 06/04/22   Burnadette Pop, MD  polyethylene glycol (MIRALAX / GLYCOLAX) 17 g packet Give 17 gram by mouth one time a day for constipation 04/01/23     predniSONE (DELTASONE) 20 MG tablet Take 2 tablets at 7PM night before procedure,  then take 2 tabs at 11PM then night before procedure, Then take 2 tabs the morning of procedure with benadryl 50 mg 04/24/23   Ethelene Hal, MD  promethazine (PHENERGAN) 25 MG tablet Take 25 mg by mouth every 8 (eight) hours as needed for vomiting or nausea. 04/23/22   [provider]  senna-docusate (SENOKOT-S) 8.6-50 MG tablet Take 1 tablet by mouth at bedtime. 11/22/22   Zannie Cove, MD  senna-docusate (STOOL SOFTENER/LAXATIVE) 8.6-50 MG tablet Take 1 tablet by mouth at bedtime for constipation 04/01/23     sevelamer (RENAGEL) 800 MG  tablet Take 1,600 mg by mouth 3 (three) times daily. 07/06/22   [provider]  sevelamer carbonate (RENVELA) 800 MG tablet Take 2 tablets (1,600 mg total) by mouth 3 (three) times daily. 04/01/23     Zinc 220 (50 Zn) MG CAPS Take 1 capsule by mouth daily for minerals, electrolytes 04/01/23     zinc sulfate 220 (50 Zn) MG capsule Take 1 capsule (220 mg total) by mouth daily. 04/11/22   Danford, Earl Lites, MD  Insulin NPH Isophane & Regular (RELION 70/30 [place]) Inject 35 Units into the skin 2 (two) times daily.  08/17/14  [provider]      Allergies  Hydralazine, Hydralazine hcl, Hydrocodone, Metformin and related, Other, Plaquenil [hydroxychloroquine sulfate], Shellfish allergy, Shellfish-derived products, Shrimp (diagnostic), and Sulfa antibiotics    Review of Systems   Review of Systems  Constitutional:  Positive for chills, fatigue and fever (sujective). Negative for diaphoresis.  HENT:  Positive for congestion.   Eyes:  Negative for visual disturbance.  Respiratory:  Positive for cough, sputum production and shortness of breath. Negative for chest tightness, wheezing and stridor.   Cardiovascular:  Negative for chest pain, palpitations and leg swelling.  Gastrointestinal:  Positive for nausea and vomiting. Negative for abdominal pain, constipation and diarrhea.  Genitourinary:  Negative for dysuria, flank pain and frequency.  Musculoskeletal:  Negative for back pain, neck pain and neck stiffness.  Skin:  Negative for rash and wound.  Neurological:  Negative for headaches.  Psychiatric/Behavioral:  Negative for agitation and confusion.   All other systems reviewed and are negative.   Physical Exam Updated Vital Signs BP (!) 147/89 (BP Location: Right Arm)   Pulse 82   Temp 98.4 F (36.9 C) (Oral)   Resp 18   SpO2 98%  Physical Exam Vitals and nursing note reviewed.  Constitutional:      General: She is not in acute distress.    Appearance: She is  well-developed. She is not ill-appearing, toxic-appearing or diaphoretic.  HENT:     Head: Normocephalic and atraumatic.     Nose: Congestion present.     Mouth/Throat:     Mouth: Mucous membranes are moist.     Pharynx: No oropharyngeal exudate or posterior oropharyngeal erythema.  Eyes:     Extraocular Movements: Extraocular movements intact.     Conjunctiva/sclera: Conjunctivae normal.     Pupils: Pupils are equal, round, and reactive to light.  Cardiovascular:     Rate and Rhythm: Normal rate and regular rhythm.     Heart sounds: No murmur heard. Pulmonary:     Effort: Pulmonary effort is normal. No respiratory distress.     Breath sounds: Normal breath sounds. No wheezing, rhonchi or rales.  Chest:     Chest wall: No tenderness.  Abdominal:     General: Abdomen is flat. There is no distension.     Palpations: Abdomen is soft.     Tenderness: There is no abdominal tenderness. There is no guarding or rebound.  Musculoskeletal:        General: No swelling or tenderness.     Cervical back: Neck supple. No tenderness.     Comments: Bilateral leg amputations  Skin:    General: Skin is warm and dry.     Capillary Refill: Capillary refill takes less than 2 seconds.  Neurological:     General: No focal deficit present.     Mental Status: She is alert. Mental status is at baseline.  Psychiatric:        Mood and Affect: Mood normal.     ED Results / Procedures / Treatments   Labs (all labs ordered are listed, but only abnormal results are displayed) Labs Reviewed  CBC WITH DIFFERENTIAL/PLATELET - Abnormal; Notable for the following components:      Result Value   WBC 11.4 (*)    Hemoglobin 11.3 (*)    Neutro Abs 8.3 (*)    Monocytes Absolute 1.2 (*)    All other components within normal limits  COMPREHENSIVE METABOLIC PANEL - Abnormal; Notable for the following components:   Sodium 133 (*)    Chloride 91 (*)    Glucose, Bld  125 (*)    BUN 27 (*)    Creatinine, Ser 6.57  (*)    Calcium 7.9 (*)    Albumin 3.2 (*)    AST 12 (*)    GFR, Estimated 7 (*)    Anion gap 19 (*)    All other components within normal limits  SARS CORONAVIRUS 2 BY RT PCR    EKG EKG Interpretation Date/Time:  Wednesday July 24 2023 12:03:35 EDT Ventricular Rate:  86 PR Interval:  148 QRS Duration:  82 QT Interval:  396 QTC Calculation: 473 R Axis:   -44  Text Interpretation: Normal sinus rhythm Left axis deviation Moderate voltage criteria for LVH, may be normal variant ( R in aVL , Cornell product ) T wave abnormality, consider lateral ischemia Abnormal ECG When compared with ECG of 23-Mar-2023 11:16, PREVIOUS ECG IS PRESENT when compared to prior, overall similar appearance. No STEMI Confirmed by Theda Belfast ([id number]) on 07/24/2023 5:00:46 PM  Radiology DG Chest 2 View  Result Date: 07/24/2023 CLINICAL DATA:  Several day history of shortness of breath and productive cough EXAM: CHEST - 2 VIEW COMPARISON:  Chest radiograph dated 03/22/2023 FINDINGS: Lines/tubes: Left internal jugular venous catheter tip projects over the right atrium. Lungs: No focal consolidations. Pleura: No pneumothorax or pleural effusion. Heart/mediastinum: The heart size and mediastinal contours are within normal limits. Bones: No acute osseous abnormality. IMPRESSION: No active cardiopulmonary disease. Electronically Signed   By: Agustin Cree M.D.   On: 07/24/2023 13:38    Procedures Procedures    Medications Ordered in ED Medications - No data to display  ED Course/ Medical Decision Making/ A&P                                 Medical Decision Making Risk Prescription drug management.    IMANA MINARIK is a 62 y.o. female with a past medical history significant for diabetes with previous gastroparesis, hypertension, hyperlipidemia, CAD, carotid disease, CHF, ESRD on dialysis MWF, previous stroke, depression, and irritable bowel syndrome who presents with URI symptoms.  According to patient,  her husband had a sore throat the other day and think she may have gotten it from him.  She reports that she has had about 3 to 4 days of fatigue, subjective fevers, chills, productive cough with phlegm, shortness of breath, malaise, nausea, diarrhea, and general illness.  She reports no chest pain or palpitations.  Denies any vomiting.  Denies any urinary changes.  Denies any new pain at this time with no focal report of headache, neck pain, chest pain, or back pain.  No abdominal pain.  She missed dialysis today due to feeling ill but otherwise wanted to make sure she did not have pneumonia or COVID.  Not feeling nauseous now.  On exam, lungs were clear with no rhonchi or wheezing or rales.  Chest and abdomen nontender.  No murmur initially.  Patient has good pulses in upper extremities and legs have her amputations.  No reported swelling or tenderness in the upper legs.  Back nontender.  Chest nontender.  Patient has some rhinorrhea and congestion.  No focal neurologic deficits initially.  Patient had EKG that showed no STEMI.  X-ray of the chest shows no pneumonia or fluid overload.  Labs show leukocytosis but otherwise seems similar to expected .  Sodium slightly low in the setting of diarrhea.  She is COVID-negative.  I do not see reason  for emergent dialysis need today with normal potassium and her remaining with 100% oxygen on her 2 L of home oxygen.  We had a shared decision-making conversation agreed with discharge home if she is able to pass a p.o. challenge to get prescription for some Tessalon Perles as well as some nausea medicine.  She will call her regular team and continue her home dialysis plan.  We agreed to hold on work sensitive workup given her well appearance and reassuring vitals.  Patient agrees.  Anticipate discharge home after p.o. challenge.  Patient passed p.o. challenge and continues to have reassuring oxygen saturations.  Patient be discharged as planned.  Patient  discharged in good condition.         Final Clinical Impression(s) / ED Diagnoses Final diagnoses:  Shortness of breath  Acute cough  Upper respiratory tract infection, unspecified type  Diarrhea, unspecified type  Nausea    Rx / DC Orders ED Discharge Orders          Ordered    ondansetron (ZOFRAN-ODT) 4 MG disintegrating tablet  Every 8 hours PRN        07/24/23 1732    benzonatate (TESSALON) 100 MG capsule  3 times daily PRN        07/24/23 1732            Clinical Impression: 1. Shortness of breath   2. Acute cough   3. Upper respiratory tract infection, unspecified type   4. Diarrhea, unspecified type   5. Nausea     Disposition: Discharge  Condition: Good  I have discussed the results, Dx and Tx plan with the pt(& family if present). He/she/they expressed understanding and agree(s) with the plan. Discharge instructions discussed at great length. Strict return precautions discussed and pt &/or family have verbalized understanding of the instructions. No further questions at time of discharge.    New Prescriptions   BENZONATATE (TESSALON) 100 MG CAPSULE    Take 1 capsule (100 mg total) by mouth 3 (three) times daily as needed for cough.   ONDANSETRON (ZOFRAN-ODT) 4 MG DISINTEGRATING TABLET    Take 1 tablet (4 mg total) by mouth every 8 (eight) hours as needed for nausea or vomiting.    Follow Up: Marcine Matar, MD [address] [place] [address] [place] [address] [phone]         , Canary Brim, MD 07/24/23 [phone]

## 2023-07-24 NOTE — ED Notes (Signed)
Pt received dc papers and prescription. Pt verbalized understanding of f/u care. Pt was taken out to family vehicle w ride home at time of DC. VSS/NAD.

## 2023-07-24 NOTE — ED Provider Triage Note (Signed)
Emergency Medicine Provider Triage Evaluation Note  Nancy [name] , a 62 y.o. female  was evaluated in triage.  Pt complains of runny nose, congestion, body aches since Saturday with fatigue and sweats. Dialysis M/W/F. Completed dialysis on Monday. NO current chest pain  Review of Systems  Positive: SOB Negative: Chest pain  Physical Exam  BP (!) 158/81   Pulse 87   Temp 98.1 F (36.7 C) (Oral)   Resp 20   SpO2 100%  Gen:   Awake, no distress   Resp:  Normal effort , +2L O2 (baseline per patient) MSK:   Moves extremities without difficulty  Medical Decision Making  Medically screening exam initiated at 12:11 PM.  Appropriate orders placed.  Nancy [name] was informed that the remainder of the evaluation will be completed by another provider, this initial triage assessment does not replace that evaluation, and the importance of remaining in the ED until their evaluation is complete.    Pete Pelt, [place] 07/24/23 1215

## 2023-07-24 NOTE — Discharge Instructions (Signed)
Their history, exam, and workup today are consistent with a likely viral upper respiratory infection.  You were negative for COVID and your x-ray did not show pneumonia or fluid overload.  Your labs are consistent with a viral infection with inflammation with a slight elevation your white blood cell count with the rest of your labs are similar to prior.  Given your stability on your home oxygen and no evidence of need for emergent dialysis, we agreed together with a shared decision-making conversation to discharge home with prescription for cough medicine and some nausea medicine.  Please rest and stay hydrated and follow-up with your primary team.  If any symptoms change or worsen acutely, please return to the nearest emergency department.

## 2023-07-25 ENCOUNTER — Other Ambulatory Visit (HOSPITAL_COMMUNITY): Payer: Self-pay

## 2023-07-31 ENCOUNTER — Other Ambulatory Visit (HOSPITAL_COMMUNITY): Payer: Self-pay

## 2023-07-31 MED ORDER — ALBUTEROL SULFATE HFA 108 (90 BASE) MCG/ACT IN AERS
2.0000 | INHALATION_SPRAY | Freq: Four times a day (QID) | RESPIRATORY_TRACT | 3 refills | Status: DC | PRN
  Filled 2023-07-31: qty 18, 25d supply, fill #0

## 2023-08-01 ENCOUNTER — Other Ambulatory Visit (HOSPITAL_COMMUNITY): Payer: Self-pay

## 2023-08-13 DIAGNOSIS — D110 Benign neoplasm of parotid gland: Secondary | ICD-10-CM | POA: Insufficient documentation

## 2023-08-15 ENCOUNTER — Ambulatory Visit: Payer: Medicare Other | Admitting: Physician Assistant

## 2023-08-15 ENCOUNTER — Encounter: Payer: Self-pay | Admitting: Physician Assistant

## 2023-08-15 VITALS — BP 159/84 | HR 92 | Ht 63.0 in | Wt 236.2 lb

## 2023-08-15 DIAGNOSIS — N189 Chronic kidney disease, unspecified: Secondary | ICD-10-CM | POA: Diagnosis not present

## 2023-08-15 DIAGNOSIS — E038 Other specified hypothyroidism: Secondary | ICD-10-CM

## 2023-08-15 DIAGNOSIS — E119 Type 2 diabetes mellitus without complications: Secondary | ICD-10-CM | POA: Diagnosis not present

## 2023-08-15 DIAGNOSIS — K219 Gastro-esophageal reflux disease without esophagitis: Secondary | ICD-10-CM

## 2023-08-15 DIAGNOSIS — I639 Cerebral infarction, unspecified: Secondary | ICD-10-CM

## 2023-08-15 DIAGNOSIS — I10 Essential (primary) hypertension: Secondary | ICD-10-CM

## 2023-08-15 DIAGNOSIS — Z09 Encounter for follow-up examination after completed treatment for conditions other than malignant neoplasm: Secondary | ICD-10-CM

## 2023-08-15 DIAGNOSIS — N186 End stage renal disease: Secondary | ICD-10-CM

## 2023-08-15 DIAGNOSIS — D490 Neoplasm of unspecified behavior of digestive system: Secondary | ICD-10-CM

## 2023-08-15 DIAGNOSIS — Z794 Long term (current) use of insulin: Secondary | ICD-10-CM | POA: Diagnosis not present

## 2023-08-15 LAB — POCT GLYCOSYLATED HEMOGLOBIN (HGB A1C): HbA1c, POC (controlled diabetic range): 6.5 % (ref 0.0–7.0)

## 2023-08-15 LAB — GLUCOSE, POCT (MANUAL RESULT ENTRY): POC Glucose: 181 mg/dl — AB (ref 70–99)

## 2023-08-15 MED ORDER — LEVOTHYROXINE SODIUM 25 MCG PO TABS
25.0000 ug | ORAL_TABLET | Freq: Every day | ORAL | 1 refills | Status: DC
Start: 2023-08-15 — End: 2023-08-21

## 2023-08-15 MED ORDER — OMEPRAZOLE 20 MG PO CPDR
40.0000 mg | DELAYED_RELEASE_CAPSULE | Freq: Every day | ORAL | 1 refills | Status: DC
Start: 2023-08-15 — End: 2023-08-21

## 2023-08-15 MED ORDER — ONDANSETRON HCL 8 MG PO TABS
8.0000 mg | ORAL_TABLET | Freq: Three times a day (TID) | ORAL | 0 refills | Status: DC | PRN
Start: 2023-08-15 — End: 2023-10-15

## 2023-08-15 MED ORDER — DEXCOM G6 TRANSMITTER MISC
1.0000 | Freq: Every day | 4 refills | Status: DC
Start: 2023-08-15 — End: 2023-08-21

## 2023-08-15 MED ORDER — ISOSORBIDE MONONITRATE ER 30 MG PO TB24
60.0000 mg | ORAL_TABLET | Freq: Every day | ORAL | 1 refills | Status: DC
Start: 2023-08-15 — End: 2023-08-21

## 2023-08-15 MED ORDER — LABETALOL HCL 300 MG PO TABS
300.0000 mg | ORAL_TABLET | Freq: Two times a day (BID) | ORAL | 1 refills | Status: DC
Start: 2023-08-15 — End: 2023-08-21

## 2023-08-15 MED ORDER — ATORVASTATIN CALCIUM 40 MG PO TABS
40.0000 mg | ORAL_TABLET | Freq: Every day | ORAL | 1 refills | Status: DC
Start: 2023-08-15 — End: 2023-08-21

## 2023-08-15 MED ORDER — DEXCOM G6 SENSOR MISC
1.0000 | Freq: Every day | 11 refills | Status: DC
Start: 2023-08-15 — End: 2023-08-21

## 2023-08-15 MED ORDER — DEXCOM G6 RECEIVER DEVI
1.0000 | Freq: Every day | 0 refills | Status: DC
Start: 2023-08-15 — End: 2023-10-08

## 2023-08-15 MED ORDER — EZETIMIBE 10 MG PO TABS
10.0000 mg | ORAL_TABLET | Freq: Every day | ORAL | 1 refills | Status: DC
Start: 2023-08-15 — End: 2023-08-21

## 2023-08-15 MED ORDER — AMLODIPINE BESYLATE 10 MG PO TABS
10.0000 mg | ORAL_TABLET | Freq: Every day | ORAL | 1 refills | Status: DC
Start: 2023-08-15 — End: 2023-08-21

## 2023-08-15 NOTE — Progress Notes (Signed)
Patient ID: SEMIAH [name], female   DOB: 1961/07/21, 62 y.o.   MRN: [id number]   Nancy [name], is a 62 y.o. female  AOZ:[id number]  NGE:[id number]  DOB - [birth year]  Chief Complaint  Patient presents with   Medical Management of Chronic Issues    Questions about paramedic coming to help with prescriptions and diabetes monitor        Subjective:   Nancy [name] is a 62 y.o. female here today for a follow up visit and to re establish care after having been in a nursing home for about 1 year (s/p CVA and bilateral BKA)until 03/2023 when she was hospitalized for CAP. She was hospitalized 4/5-03/24/2023.  Insulin was discontinued bc A1C was A1C was down.  She is no longer in the nursing home and needs to reestablish care.  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  Needs RF of meds.  She says no med changes since discharge in April other than them discontinuing insulin. No new issues or concerns.  Medication list is a mess and there are several duplicates  She previously had paramedic service in the home and would like to re-instate that.    From discharge summary: Hospital Course:  Assessment and Plan:   Leukocytosis  Volume Overload vs Pneumonia - elevated white count + bilateral interstitial opacities - possible pneumonia vs overload, she's stable today on abx - will complete course of abx given leukocytosis at presentation  - Reportedly she is at baseline 3 to 4 L however she is stable here on room air - follow cultures  - renal to manage volume with dialysis outpatient    ESRD on dialysis -HD MWF.  -dialysis today per renal   Chronic diastolic CHF (congestive heart failure) - Appears euvolemic on exam   Insulin dependent type 2 diabetes mellitus - A1c of 4.9 in December -- insulin d/c'd - follow BG's outpatient, but appears no longer need for meds right now   History of CVA (cerebrovascular accident) Continue aspirin, statin and Zetia   Hyperkalemia -resolved -follow per  renal outpatient    HTN (hypertension) - imdur, labetalol, amlodipine --Reportedly was hypotensive at dialysis center but unclear how low.  She typically holds her BP meds prior to dialysis on dialysis days per her RN.        No problems updated.  ALLERGIES: Allergies  Allergen Reactions   Hydralazine Other (See Comments)   Hydralazine Hcl Other (See Comments)    Hair loss   Hydrocodone Itching and Other (See Comments)    Upset stomach   Metformin And Related Nausea And Vomiting and Other (See Comments)    Stomach pains, also   Other Nausea Only and Other (See Comments)    Lettuce- Does not digest this!!   Plaquenil [Hydroxychloroquine Sulfate] Hives   Shellfish Allergy Nausea And Vomiting   Shellfish-Derived Products Nausea Only and Other (See Comments)    Caused an upset stomach   Shrimp (Diagnostic) Nausea Only and Other (See Comments)    Upset stomach    Sulfa Antibiotics Hives    PAST MEDICAL HISTORY: Past Medical History:  Diagnosis Date   Anemia 2006   Anginal pain (HCC)    Carotid artery disease (HCC)    right ICA occlusion, 40-59% LICA 02/2021 [place]   CHF (congestive heart failure) (HCC)    Coronary artery disease    Depression 2014   previously on amitryptiline    Diabetes mellitus with neurological manifestation (HCC) 2006   Diabetic peripheral neuropathy (HCC) 04/26/2020  Dysphagia    Dyspnea    ESRD on hemodialysis (HCC)    Fracture of left ankle 1997   Gastroparesis 07/2016   Heart murmur    HOH (hard of hearing) 2004   Hyperlipidemia 2006   Hypertension 2006   IBS (irritable bowel syndrome) 2002   Leukopenia 2015   Macular degeneration 11/2019   Peripheral vascular disease (HCC)    Shingles 2009   Stroke [facility]) 2020   Thyroid nodule 2004    MEDICATIONS AT HOME: Prior to Admission medications   Medication Sig Start Date End Date Taking? Authorizing Provider  albuterol (VENTOLIN HFA) 108 (90 Base) MCG/ACT inhaler Inhale 2 puffs into the lungs  every 6 (six) hours as needed. 07/31/23  Yes   aspirin 81 MG EC tablet Take 1 tablet (81 mg total) by mouth daily. 09/26/21  Yes Milford, Anderson Malta, FNP  benzonatate (TESSALON) 100 MG capsule Take 1 capsule (100 mg total) by mouth 3 (three) times daily as needed for cough. 07/24/23  Yes Tegeler, Canary Brim, MD  Blood Glucose Monitoring Suppl (ONETOUCH VERIO) w/Device KIT Check blood sugar three times daily. E11.40 05/06/20  Yes Marcine Matar, MD  glucose blood test strip Use as instructedCheck blood sugar three times daily. E11.40 07/11/20  Yes Marcine Matar, MD  Insulin Pen Needle 32G X 4 MM MISC Use to inject insulin as directed. 11/07/21  Yes Marcine Matar, MD  Multiple Vitamin (MULTI-VITAMIN DAILY) TABS Take 1 tablet by mouth at bedtime for supplementation 04/01/23  Yes   Multiple Vitamin (MULTIVITAMIN) tablet Take 1 tablet by mouth at bedtime.   Yes [provider]  polyethylene glycol (MIRALAX / GLYCOLAX) 17 g packet Give 17 gram by mouth one time a day for constipation 04/01/23  Yes   senna-docusate (STOOL SOFTENER/LAXATIVE) 8.6-50 MG tablet Take 1 tablet by mouth at bedtime for constipation 04/01/23  Yes   sevelamer carbonate (RENVELA) 800 MG tablet Take 2 tablets (1,600 mg total) by mouth 3 (three) times daily. 04/01/23  Yes   Zinc 220 (50 Zn) MG CAPS Take 1 capsule by mouth daily for minerals, electrolytes 04/01/23  Yes   amLODipine (NORVASC) 10 MG tablet Take 1 tablet (10 mg total) by mouth daily for hypertension 08/15/23   Georgian Co M, PA-C  atorvastatin (LIPITOR) 40 MG tablet Take 1 tablet (40 mg total) by mouth daily for antilipids 08/15/23   Anders Simmonds, PA-C  Continuous Glucose Receiver (DEXCOM G6 RECEIVER) DEVI 1 Device by Does not apply route daily. 08/15/23   Anders Simmonds, PA-C  Continuous Glucose Sensor (DEXCOM G6 SENSOR) MISC 1 packet by Does not apply route daily. 08/15/23   Anders Simmonds, PA-C  Continuous Glucose Transmitter (DEXCOM G6  TRANSMITTER) MISC 1 packet by Does not apply route daily. 08/15/23   Anders Simmonds, PA-C  ezetimibe (ZETIA) 10 MG tablet Take 1 tablet (10 mg total) by mouth daily for antilipids 08/15/23   Georgian Co M, PA-C  isosorbide mononitrate (IMDUR) 30 MG 24 hr tablet Take 2 tablets (60 mg total) by mouth daily for blood pressure 08/15/23   Georgian Co M, PA-C  labetalol (NORMODYNE) 300 MG tablet Take 1 tablet (300 mg total) by mouth 2 (two) times daily for beta blockers 08/15/23   Anders Simmonds, PA-C  levothyroxine (SYNTHROID) 25 MCG tablet Take 1 tablet (25 mcg total) by mouth daily for low thyroid hormone 08/15/23   Georgian Co M, PA-C  omeprazole (PRILOSEC) 20 MG capsule Take  2 capsules (40 mg total) by mouth daily for GERD 08/15/23   Georgian Co M, PA-C  ondansetron (ZOFRAN) 8 MG tablet Take 1 tablet (8 mg total) by mouth every 8 (eight) hours as needed for nausea 08/15/23   Georgian Co M, PA-C  Insulin NPH Isophane & Regular (RELION 70/30 [place]) Inject 35 Units into the skin 2 (two) times daily.  08/17/14  [provider]    ROS: Neg HEENT Neg resp Neg cardiac Neg GI Neg GU Neg MS Neg psych Neg neuro  Objective:   Vitals:   08/15/23 1118 08/15/23 1153  BP: (!) 171/77 (!) 159/84  Pulse: 92   SpO2: 97%   Weight: 236 lb 3.2 oz (107.1 kg)   Height: 5\' 3"  (1.6 m)    Exam General appearance : Awake, alert, not in any distress. Speech Clear. Not toxic looking HEENT: Atraumatic and Normocephalic Neck: Supple, no JVD. No cervical lymphadenopathy.  Chest: Good air entry bilaterally, CTAB.  No rales/rhonchi/wheezing CVS: S1 S2 regular, no murmurs.  Neurology: Awake alert, and oriented X 3, CN II-XII intact, Non focal Skin: No Rash  Data Review Lab Results  Component Value Date   HGBA1C 6.5 08/15/2023   HGBA1C 4.9 11/18/2022   HGBA1C 6.3 (H) 02/16/2022    Assessment & Plan   1. Type 2 diabetes mellitus without complication, with long-term current use of  insulin (HCC) Work at a goal of eliminating sugary drinks, candy, desserts, sweets, refined sugars, processed foods, and white carbohydrates.  The patient expresses understanding.  Will hold off on meds for now-she wanted a new Dexcom to keep an eye on her sugars - Glucose (CBG) - HgB A1c - Continuous Glucose Transmitter (DEXCOM G6 TRANSMITTER) MISC; 1 packet by Does not apply route daily.  Dispense: 1 each; Refill: 4 - Continuous Glucose Sensor (DEXCOM G6 SENSOR) MISC; 1 packet by Does not apply route daily.  Dispense: 3 each; Refill: 11 - Continuous Glucose Receiver (DEXCOM G6 RECEIVER) DEVI; 1 Device by Does not apply route daily.  Dispense: 1 each; Refill: 0  2. Chronic renal failure, unspecified CKD stage On HD M,W,F  3. Other specified hypothyroidism Will assess dosing - levothyroxine (SYNTHROID) 25 MCG tablet; Take 1 tablet (25 mcg total) by mouth daily for low thyroid hormone  Dispense: 90 tablet; Refill: 1 - Thyroid Panel With TSH  4. ESRD (end stage renal disease) (HCC) Same as #2  5. Gastroesophageal reflux disease without esophagitis - ondansetron (ZOFRAN) 8 MG tablet; Take 1 tablet (8 mg total) by mouth every 8 (eight) hours as needed for nausea  Dispense: 42 tablet; Refill: 0 - omeprazole (PRILOSEC) 20 MG capsule; Take 2 capsules (40 mg total) by mouth daily for GERD  Dispense: 180 capsule; Refill: 1  6. 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 Cerebrovascular accident (CVA), unspecified mechanism (HCC) Paperwork to reinstate paramedic home services - ezetimibe (ZETIA) 10 MG tablet; Take 1 tablet (10 mg total) by mouth daily for antilipids  Dispense: 90 tablet; Refill: 1 - atorvastatin (LIPITOR) 40 MG tablet; Take 1 tablet (40 mg total) by mouth daily for antilipids  Dispense: 90 tablet; Refill: 1 - isosorbide mononitrate (IMDUR) 30 MG 24 hr tablet; Take 2 tablets (60 mg total) by mouth daily for blood pressure   Dispense: 180 tablet; Refill: 1  9. Hypertension, unspecified type - amLODipine (NORVASC) 10 MG tablet; Take 1 tablet (10 mg total) by mouth daily for hypertension  Dispense: 90 tablet; Refill: 1 - isosorbide mononitrate (IMDUR) 30 MG 24 hr tablet; Take 2 tablets (60 mg total) by mouth daily for blood pressure  Dispense: 180 tablet; Refill: 1 - labetalol (NORMODYNE) 300 MG tablet; Take 1 tablet (300 mg total) by mouth 2 (two) times daily for beta blockers  Dispense: 180 tablet; Refill: 1  10. Hospital discharge follow-up  Return in about 3 months (around 11/15/2023) for PCP for chronic conditions-Johnosn.  The patient was given clear instructions to go to ER or return to medical center if symptoms don't improve, worsen or new problems develop. The patient verbalized understanding. The patient was told to call to get lab results if they haven't heard anything in the next week.      Georgian Co, PA-C [facility] [place], [place] [phone]   08/15/2023, 12:34 PM

## 2023-08-16 ENCOUNTER — Telehealth (HOSPITAL_COMMUNITY): Payer: Self-pay | Admitting: Emergency Medicine

## 2023-08-16 LAB — THYROID PANEL WITH TSH
Free Thyroxine Index: 1.8 (ref 1.2–4.9)
T3 Uptake Ratio: 26 % (ref 24–39)
T4, Total: 6.9 ug/dL (ref 4.5–12.0)
TSH: 3.41 u[IU]/mL (ref 0.450–4.500)

## 2023-08-16 NOTE — Telephone Encounter (Signed)
Called to schedule new referral home visit.  LVM w/ my name and number.  Would like to schedule appointment 9/3 @ 1:30. Will be out of the office on Monday due to the holiday but back in on Tuesday.    Beatrix Shipper, EMT-Paramedic [phone] 08/16/2023

## 2023-08-20 ENCOUNTER — Telehealth (HOSPITAL_COMMUNITY): Payer: Self-pay | Admitting: Emergency Medicine

## 2023-08-20 ENCOUNTER — Telehealth: Payer: Self-pay | Admitting: Internal Medicine

## 2023-08-20 ENCOUNTER — Other Ambulatory Visit (HOSPITAL_COMMUNITY): Payer: Self-pay | Admitting: Emergency Medicine

## 2023-08-20 ENCOUNTER — Telehealth: Payer: Self-pay

## 2023-08-20 ENCOUNTER — Other Ambulatory Visit (HOSPITAL_COMMUNITY): Payer: Self-pay

## 2023-08-20 NOTE — Telephone Encounter (Signed)
Pt's husband is calling in because pt's prescriptions:  amLODipine (NORVASC) 10 MG tablet [[id number]],   atorvastatin (LIPITOR) 40 MG tablet [[id number]],   Continuous Glucose Receiver (DEXCOM G6 RECEIVER) DEVI [[id number]],   Continuous Glucose Sensor (DEXCOM G6 SENSOR) MISC [[id number]],   Continuous Glucose Transmitter (DEXCOM G6 TRANSMITTER) MISC [[id number]],   ezetimibe (ZETIA) 10 MG tablet [[id number]],  isosorbide mononitrate (IMDUR) 30 MG 24 hr tablet [[id number]],   labetalol (NORMODYNE) 300 MG tablet [[id number]]  levothyroxine (SYNTHROID) 25 MCG tablet [[id number]],   omeprazole (PRILOSEC) 20 MG capsule [[id number]],   ondansetron (ZOFRAN) 8 MG tablet [[id number]]   were sent to the wrong pharmacy. The medications were sent to a pharmacy in [place] and the pt lives in [place]. [name] (Husband) is requesting the prescriptions be sent to [facility] [address]

## 2023-08-20 NOTE — Progress Notes (Unsigned)
SOCIAL/MEDICAL BARRIERS:  PHARMACY USED [facility]   MED ISSUES:  AFFORDABILITY - no issues  PT ASSIST APPS NEEDED NONE  PCP Jonah Blue   INSURANCE Medicaid/Medicare  SOURCE OF INCOME Disability  TRANSPORTATION [employer] [facility]- waiting on doctor to fill out paperwork  FOOD INSECURITIES/NEEDS My need to seek food pantry assistance. FOOD STAMPS will assist her in applying for same  REVIEWED DIET/FLUID/SALT RESTRICTIONS familiar with same  RENT/OWN HOME ISSUES Renting  SOCIAL SUPPORT   SAFETY/DOMESTIC ISSUES NONE  SUBSTANCE ABUSE NONE  DAILY WEIGHTS weighs only at dialysis- plan to provide scale from [facility] Clinic  EDUCATE ON DISEASE PROCESS/SYMPTOMS/PURPOSES OF MEDS - YES  Paramedicine Encounter    Patient ID: MILISSA SWINK, female    DOB: 07-05-1961, 62 y.o.   MRN: [id number]   Complaints NONE  Assessment A*0 x 4. Skin W&D w/ good color.    Compliance with meds 1st visit- she has been taking meds from bottles- switching her over to pill box.  Pill box filled x 1 week  Refills needed NONE already ready @ [facility]  Meds changes since last visit- Taking     Social changes NONE   Pulse 93   Wt 233 lb (105.7 kg)   SpO2 95%   BMI 41.27 kg/m  Weight yesterday- Not taking Last visit weight-236lb  Today is my first day w/ Mrs. Bradham.  She was previously in paramedicine w/ Maralyn Sago.  She and her husband are now renting a home on [address].  She has ramp access that her husband built for her.  She has a hospital bed in the living room, oxygen concentrator and bedside commode.  Her husband is her primary caregiver and is very attentive to her needs.  He works but is able to come home intermitently to assist her when needed. Med box reconciled x 1 week.  Recent refill requests had been sent to the incorrect pharmacy and they are now in the process of being sent to the correct pharmacy and her husband will pick up  same. During home visit Ms. Hosman called her husband to tell him that she has had an accident in her diaper and asked if he could come home from work to clean her up.  I advised her that I would be glad to take care of her and she agreed so that her husband didn't have to leave work again.  Assisted pt to bedside commode.  Helped her clean up from bowel movement with a warm wash cloth and fresh adult diaper.  Her diaper appeared too small and I advised her I had a larger size at my office that I would bring to her at our next visit and she agreed with same.  ACTION: Home visit completed  Bethanie Dicker [phone] 08/20/23  Patient Care Team: Marcine Matar, MD as PCP - General (Internal Medicine) Runell Gess, MD as PCP - Cardiology (Cardiology) Laurey Morale, MD as PCP - Advanced [facility] (Cardiology) [facility], [name]  Patient Active Problem List   Diagnosis Date Noted   History of CVA (cerebrovascular accident) 03/22/2023   Community acquired pneumonia 03/22/2023   Acute respiratory failure with hypoxia (HCC) 11/04/2022   Pain, unspecified 08/15/2022   Complication of vascular dialysis catheter 08/07/2022   Chest pain 05/30/2022   Fluid overload 05/30/2022   Acute pulmonary edema (HCC) 05/30/2022   Iron deficiency anemia, unspecified 04/26/2022   Moderate protein-calorie malnutrition (HCC) 04/18/2022  Nausea 04/10/2022   Hyponatremia 04/04/2022   Prolonged QT interval 04/04/2022   Anaphylactic shock, unspecified, initial encounter 02/23/2022   Coagulation defect, unspecified (HCC) 02/23/2022   Other allergy, initial encounter 02/23/2022   Pressure injury of skin 02/19/2022   Anemia in chronic kidney disease 02/16/2022   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) 02/16/2022   Hyperlipidemia, unspecified 02/16/2022   Hypertensive heart and chronic kidney disease with heart failure and with stage 5 chronic kidney disease, or end stage renal  disease (HCC) 02/16/2022   Personal history of nicotine dependence 02/16/2022   Personal history of transient ischemic attack (TIA), and cerebral infarction without residual deficits 02/16/2022   Secondary hyperparathyroidism of renal origin (HCC) 02/16/2022   Type 2 diabetes mellitus with diabetic peripheral angiopathy without gangrene (HCC) 02/16/2022   Renal anasarca 02/14/2022   ESRD on dialysis (HCC) 02/14/2022   Hyperkalemia 02/14/2022   S/P arteriovenous (AV) fistula creation 12/22/2021   Critical limb ischemia of both lower extremities (HCC) 11/22/2021   Subacute osteomyelitis, right ankle and foot (HCC)    Peripheral artery disease (HCC) 10/27/2021   Acute exacerbation of congestive heart failure (HCC) 10/24/2021   Acute cardiogenic pulmonary edema (HCC) 10/06/2021   CAD (coronary artery disease) 10/06/2021   Goals of care, counseling/discussion    Gangrene of left foot (HCC)    Cellulitis of left foot    Diabetic foot ulcers (HCC) 09/04/2021   CHF (congestive heart failure) (HCC) 07/07/2021   Acute postoperative anemia due to expected blood loss superimposed on anemia of chronic renal insufficiency 06/30/2021   CKD (chronic kidney disease), stage IV (HCC) 06/11/2021   Chronic diastolic CHF (congestive heart failure) (HCC) 06/10/2021   Abnormal nuclear stress test    Dyspnea on exertion 03/07/2021   Orthopnea 02/25/2021   History of cerebrovascular accident (CVA) with residual deficit 05/06/2020   Diabetic peripheral neuropathy (HCC) 04/26/2020   AKI (acute kidney injury) (HCC) 04/20/2020   Generalized weakness 04/20/2020   Dehydration 04/20/2020   Generalized abdominal pain    Pancreatitis, acute 02/29/2020   Cerebrovascular accident (CVA) (HCC) 11/08/2019   Stenosis of right carotid artery 11/08/2019   Mixed diabetic hyperlipidemia associated with type 2 diabetes mellitus (HCC) 11/08/2019   Cortical age-related cataract of both eyes 08/30/2019   Gait abnormality  08/30/2019   Statin declined 08/30/2019   Gastroesophageal reflux disease without esophagitis 04/18/2018   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   Insulin dependent type 2 diabetes mellitus (HCC) 10/29/2017   History of macular degeneration 10/29/2017   Chronic cervical pain 10/29/2016   Myalgia 09/14/2016   Insomnia 09/14/2016   Tinea pedis of both feet 08/20/2016   Macular degeneration 08/17/2016   Low back pain 09/21/2014   Hypertensive urgency 09/21/2014   Diabetic gastroparesis associated with type 2 diabetes mellitus (HCC) 09/14/2014   HTN (hypertension) 09/08/2014   Thyroid nodule 08/17/2014   Morbid obesity (HCC) 08/17/2014   Hypokalemia 08/06/2014   Type II diabetes mellitus with neurological manifestations, uncontrolled 08/04/2014    Current Outpatient Medications:    amLODipine (NORVASC) 10 MG tablet, Take 1 tablet (10 mg total) by mouth daily for hypertension, Disp: 90 tablet, Rfl: 1   atorvastatin (LIPITOR) 40 MG tablet, Take 1 tablet (40 mg total) by mouth daily for antilipids, Disp: 90 tablet, Rfl: 1   benzonatate (TESSALON) 100 MG capsule, Take 1 capsule (100 mg total) by mouth 3 (three) times daily as needed for cough., Disp: 21 capsule, Rfl: 0   ezetimibe (ZETIA) 10 MG tablet, Take 1 tablet (  10 mg total) by mouth daily for antilipids, Disp: 90 tablet, Rfl: 1   Insulin Pen Needle 32G X 4 MM MISC, Use to inject insulin as directed., Disp: 100 each, Rfl: 4   labetalol (NORMODYNE) 300 MG tablet, Take 1 tablet (300 mg total) by mouth 2 (two) times daily for beta blockers, Disp: 180 tablet, Rfl: 1   levothyroxine (SYNTHROID) 25 MCG tablet, Take 1 tablet (25 mcg total) by mouth daily for low thyroid hormone, Disp: 90 tablet, Rfl: 1   ondansetron (ZOFRAN) 8 MG tablet, Take 1 tablet (8 mg total) by mouth every 8 (eight) hours as needed for nausea, Disp: 42 tablet, Rfl: 0   sevelamer carbonate (RENVELA) 800 MG tablet, Take 2 tablets (1,600 mg total) by mouth 3 (three) times daily., Disp: 126  tablet, Rfl: 0   albuterol (VENTOLIN HFA) 108 (90 Base) MCG/ACT inhaler, Inhale 2 puffs into the lungs every 6 (six) hours as needed., Disp: 18 g, Rfl: 3   aspirin 81 MG EC tablet, Take 1 tablet (81 mg total) by mouth daily. (Patient not taking: Reported on 08/20/2023), Disp: 60 tablet, Rfl: 1   Blood Glucose Monitoring Suppl (ONETOUCH VERIO) w/Device KIT, Check blood sugar three times daily. E11.40 (Patient not taking: Reported on 08/20/2023), Disp: 1 kit, Rfl: 0   Continuous Glucose Receiver (DEXCOM G6 RECEIVER) DEVI, 1 Device by Does not apply route daily. (Patient not taking: Reported on 08/20/2023), Disp: 1 each, Rfl: 0   Continuous Glucose Sensor (DEXCOM G6 SENSOR) MISC, 1 packet by Does not apply route daily. (Patient not taking: Reported on 08/20/2023), Disp: 3 each, Rfl: 11   Continuous Glucose Transmitter (DEXCOM G6 TRANSMITTER) MISC, 1 packet by Does not apply route daily. (Patient not taking: Reported on 08/20/2023), Disp: 1 each, Rfl: 4   glucose blood test strip, Use as instructedCheck blood sugar three times daily. E11.40 (Patient not taking: Reported on 08/20/2023), Disp: 100 each, Rfl: 12   isosorbide mononitrate (IMDUR) 30 MG 24 hr tablet, Take 2 tablets (60 mg total) by mouth daily for blood pressure (Patient not taking: Reported on 08/20/2023), Disp: 180 tablet, Rfl: 1   Multiple Vitamin (MULTI-VITAMIN DAILY) TABS, Take 1 tablet by mouth at bedtime for supplementation (Patient not taking: Reported on 08/20/2023), Disp: 14 tablet, Rfl: 0   Multiple Vitamin (MULTIVITAMIN) tablet, Take 1 tablet by mouth at bedtime. (Patient not taking: Reported on 08/20/2023), Disp: , Rfl:    omeprazole (PRILOSEC) 20 MG capsule, Take 2 capsules (40 mg total) by mouth daily for GERD (Patient not taking: Reported on 08/20/2023), Disp: 180 capsule, Rfl: 1   polyethylene glycol (MIRALAX / GLYCOLAX) 17 g packet, Give 17 gram by mouth one time a day for constipation (Patient not taking: Reported on 08/20/2023), Disp: 14 packet,  Rfl: 0   senna-docusate (STOOL SOFTENER/LAXATIVE) 8.6-50 MG tablet, Take 1 tablet by mouth at bedtime for constipation (Patient not taking: Reported on 08/20/2023), Disp: 14 tablet, Rfl: 0   Zinc 220 (50 Zn) MG CAPS, Take 1 capsule by mouth daily for minerals, electrolytes (Patient not taking: Reported on 08/20/2023), Disp: 14 capsule, Rfl: 0 Allergies  Allergen Reactions   Hydralazine Other (See Comments)   Hydralazine Hcl Other (See Comments)    Hair loss   Hydrocodone Itching and Other (See Comments)    Upset stomach   Metformin And Related Nausea And Vomiting and Other (See Comments)    Stomach pains, also   Other Nausea Only and Other (See Comments)    Lettuce- Does  not digest this!!   Plaquenil [Hydroxychloroquine Sulfate] Hives   Shellfish Allergy Nausea And Vomiting   Shellfish-Derived Products Nausea Only and Other (See Comments)    Caused an upset stomach   Shrimp (Diagnostic) Nausea Only and Other (See Comments)    Upset stomach    Sulfa Antibiotics Hives     Social History   Socioeconomic History   Marital status: Legally Separated    Spouse name: [name]   Number of children: 1   Years of education: some colle   Highest education level: Not on file  Occupational History   Occupation: Employed FT as Warden/ranger    Comment: [employer] , NA  Tobacco Use   Smoking status: Former    Current packs/day: 0.25    Average packs/day: 0.3 packs/day for 0.5 years (0.1 ttl pk-yrs)    Types: Cigarettes    Passive exposure: Never   Smokeless tobacco: Never   Tobacco comments:    Former smoker 11/03/21  Vaping Use   Vaping status: Never Used  Substance and Sexual Activity   Alcohol use: No   Drug use: No   Sexual activity: Not Currently    Birth control/protection: None  Other Topics Concern   Not on file  Social History Narrative   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    Social Determinants of Health   Financial Resource Strain:  Low Risk  (09/28/2021)   Overall Financial Resource Strain (CARDIA)    Difficulty of Paying Living Expenses: Not very hard  Recent Concern: Physicist, medical Strain - High Risk (07/11/2021)   Overall Financial Resource Strain (CARDIA)    Difficulty of Paying Living Expenses: Hard  Food Insecurity: No Food Insecurity (09/28/2021)   Hunger Vital Sign    Worried About Running Out of Food in the Last Year: Never true    Ran Out of Food in the Last Year: Never true  Recent Concern: Food Insecurity - Food Insecurity Present (07/11/2021)   Hunger Vital Sign    Worried About Running Out of Food in the Last Year: Sometimes true    Ran Out of Food in the Last Year: Sometimes true  Transportation Needs: No Transportation Needs (09/28/2021)   PRAPARE - Administrator, Civil Service (Medical): No    Lack of Transportation (Non-Medical): No  Physical Activity: Not on file  Stress: Not on file  Social Connections: Not on file  Intimate Partner Violence: Not on file    Physical Exam      No future appointments.

## 2023-08-20 NOTE — Telephone Encounter (Signed)
Spoke with Trula Ore at [facility], [place]. List of medication that were sent to [facility] given. Trula Ore said they were working on getting medication filled for patient.

## 2023-08-20 NOTE — Telephone Encounter (Signed)
Pt was called and is aware of results, DOB was confirmed.  ?

## 2023-08-20 NOTE — Telephone Encounter (Signed)
-----   Message from Georgian Co sent at 08/16/2023  2:58 PM EDT ----- Thyroid is normal. Avoid eating foods with sugar and starches to help blood sugars. Thanks, Georgian Co

## 2023-08-20 NOTE — Telephone Encounter (Signed)
Called, spoke with Nancy [name] and scheduled paramedicine visit today @ 2:30    Beatrix Shipper, EMT-Paramedic [phone] 08/20/2023

## 2023-08-21 ENCOUNTER — Other Ambulatory Visit: Payer: Self-pay | Admitting: Pharmacist

## 2023-08-21 ENCOUNTER — Telehealth (HOSPITAL_COMMUNITY): Payer: Self-pay

## 2023-08-21 ENCOUNTER — Other Ambulatory Visit: Payer: Self-pay

## 2023-08-21 ENCOUNTER — Other Ambulatory Visit (HOSPITAL_COMMUNITY): Payer: Self-pay

## 2023-08-21 DIAGNOSIS — E119 Type 2 diabetes mellitus without complications: Secondary | ICD-10-CM

## 2023-08-21 DIAGNOSIS — I10 Essential (primary) hypertension: Secondary | ICD-10-CM

## 2023-08-21 DIAGNOSIS — I639 Cerebral infarction, unspecified: Secondary | ICD-10-CM

## 2023-08-21 DIAGNOSIS — E038 Other specified hypothyroidism: Secondary | ICD-10-CM

## 2023-08-21 DIAGNOSIS — K219 Gastro-esophageal reflux disease without esophagitis: Secondary | ICD-10-CM

## 2023-08-21 MED ORDER — LABETALOL HCL 300 MG PO TABS
300.0000 mg | ORAL_TABLET | Freq: Two times a day (BID) | ORAL | 1 refills | Status: DC
  Filled 2023-08-21: qty 180, 90d supply, fill #0
  Filled 2023-11-13 – 2023-11-18 (×2): qty 180, 90d supply, fill #1

## 2023-08-21 MED ORDER — ISOSORBIDE MONONITRATE ER 30 MG PO TB24
60.0000 mg | ORAL_TABLET | Freq: Every day | ORAL | 1 refills | Status: DC
Start: 2023-08-21 — End: 2024-02-11
  Filled 2023-08-21: qty 180, 90d supply, fill #0
  Filled 2023-11-13: qty 180, 90d supply, fill #1
  Filled 2023-11-18: qty 60, 30d supply, fill #1
  Filled 2023-11-18: qty 120, 60d supply, fill #1
  Filled 2023-11-19: qty 120, 60d supply, fill #2
  Filled 2023-12-03: qty 120, 60d supply, fill #0

## 2023-08-21 MED ORDER — ALBUTEROL SULFATE HFA 108 (90 BASE) MCG/ACT IN AERS
2.0000 | INHALATION_SPRAY | Freq: Four times a day (QID) | RESPIRATORY_TRACT | 3 refills | Status: AC | PRN
  Filled 2023-08-21 – 2023-11-18 (×2): qty 18, 25d supply, fill #0

## 2023-08-21 MED ORDER — ACCU-CHEK GUIDE W/DEVICE KIT
PACK | 0 refills | Status: DC
Start: 2023-08-21 — End: 2024-08-10
  Filled 2023-08-21: qty 1, fill #0
  Filled 2023-11-18: qty 1, 30d supply, fill #0

## 2023-08-21 MED ORDER — ATORVASTATIN CALCIUM 40 MG PO TABS
40.0000 mg | ORAL_TABLET | Freq: Every day | ORAL | 1 refills | Status: DC
  Filled 2023-08-21: qty 90, 90d supply, fill #0
  Filled 2023-11-13 – 2023-11-18 (×2): qty 90, 90d supply, fill #1

## 2023-08-21 MED ORDER — EZETIMIBE 10 MG PO TABS
10.0000 mg | ORAL_TABLET | Freq: Every day | ORAL | 1 refills | Status: DC
Start: 2023-08-21 — End: 2024-02-11
  Filled 2023-08-21: qty 90, 90d supply, fill #0
  Filled 2023-11-13 – 2023-11-18 (×2): qty 90, 90d supply, fill #1

## 2023-08-21 MED ORDER — ACCU-CHEK GUIDE VI STRP
ORAL_STRIP | 6 refills | Status: DC
Start: 2023-08-21 — End: 2024-08-10
  Filled 2023-08-21 – 2023-10-30 (×2): qty 100, 33d supply, fill #0

## 2023-08-21 MED ORDER — OMEPRAZOLE 20 MG PO CPDR
40.0000 mg | DELAYED_RELEASE_CAPSULE | Freq: Every day | ORAL | 1 refills | Status: DC
  Filled 2023-08-21: qty 180, 90d supply, fill #0
  Filled 2023-11-13 – 2023-11-18 (×2): qty 180, 90d supply, fill #1

## 2023-08-21 MED ORDER — DEXCOM G6 TRANSMITTER MISC
1.0000 | Freq: Every day | 4 refills | Status: DC
  Filled 2023-08-21: qty 1, 1d supply, fill #0
  Filled 2023-08-21: qty 1, 90d supply, fill #0

## 2023-08-21 MED ORDER — AMLODIPINE BESYLATE 10 MG PO TABS
10.0000 mg | ORAL_TABLET | Freq: Every day | ORAL | 1 refills | Status: DC
Start: 2023-08-21 — End: 2024-02-17
  Filled 2023-08-21 – 2023-10-08 (×2): qty 90, 90d supply, fill #0
  Filled 2024-01-13: qty 90, 90d supply, fill #1

## 2023-08-21 MED ORDER — LEVOTHYROXINE SODIUM 25 MCG PO TABS
25.0000 ug | ORAL_TABLET | Freq: Every day | ORAL | 1 refills | Status: DC
  Filled 2023-08-21: qty 90, 90d supply, fill #0
  Filled 2023-11-13 – 2023-11-18 (×2): qty 90, 90d supply, fill #1

## 2023-08-21 MED ORDER — DEXCOM G6 SENSOR MISC
11 refills | Status: DC
Start: 2023-08-21 — End: 2023-10-08
  Filled 2023-08-21: qty 3, 30d supply, fill #0
  Filled 2023-08-21: qty 3, 28d supply, fill #0

## 2023-08-21 MED ORDER — ACCU-CHEK SOFTCLIX LANCETS MISC
6 refills | Status: DC
Start: 2023-08-21 — End: 2024-08-10
  Filled 2023-08-21: qty 100, fill #0
  Filled 2023-10-30: qty 100, 33d supply, fill #0
  Filled 2023-11-18: qty 100, 30d supply, fill #0

## 2023-08-21 NOTE — Telephone Encounter (Signed)
Rxns resent.

## 2023-08-21 NOTE — Telephone Encounter (Signed)
Spoke to [employer] at [facility] to see if they had prescriptions ready for Nancy [name] and they report they had nothing in process and that the provider needed to re-send all her meds to be processed there and to cancel all meds being filled at [facility] in [place]. I will forward to [facility] to assist.   Maralyn Sago, EMT-Paramedic [phone] 08/21/2023

## 2023-08-21 NOTE — Addendum Note (Signed)
Addended by: Lois Huxley, Jeannett Senior L on: 08/21/2023 10:58 AM   Modules accepted: Orders

## 2023-08-22 ENCOUNTER — Other Ambulatory Visit (HOSPITAL_COMMUNITY): Payer: Self-pay

## 2023-08-22 ENCOUNTER — Other Ambulatory Visit: Payer: Self-pay

## 2023-08-22 ENCOUNTER — Telehealth (HOSPITAL_COMMUNITY): Payer: Self-pay | Admitting: Licensed Clinical Social Worker

## 2023-08-22 ENCOUNTER — Telehealth: Payer: Self-pay

## 2023-08-22 NOTE — Telephone Encounter (Signed)
Pt needing talking scale- CSW able to assist- should arrive tomorrow  Burna Sis, LCSW Clinical Social Worker [facility] Desk#: [phone] Cell#: [phone]

## 2023-08-22 NOTE — Telephone Encounter (Signed)
PCS referral faxed to [facility].

## 2023-08-27 ENCOUNTER — Other Ambulatory Visit (HOSPITAL_COMMUNITY): Payer: Self-pay

## 2023-08-27 ENCOUNTER — Other Ambulatory Visit (HOSPITAL_COMMUNITY): Payer: Self-pay | Admitting: Emergency Medicine

## 2023-08-27 NOTE — Progress Notes (Unsigned)
Paramedicine Encounter    Patient ID: Nancy [name], female    DOB: February 12, 1961, 62 y.o.   MRN: [id number]   Complaints NONE  Assessment A&O x 4, skin W&D w/ good color.  Lung sounds clear and no noted edema.  Compliance with meds- missed multiple doses of her Sevelamer Noon and evening doses.  Missed 2 doses of her amlodopine (Mon. & Wed)  Pill box filled x 1 week  Refills needed Sevelamer CALLED IN REFILLS  Meds changes since last visit none    Social changes NONE   There were no vitals taken for this visit. Weight yesterday- not taken Last visit weight-233lb  ACTION: {Paramed Action:[phone]}  Beatrix Shipper, EMT-Paramedic [phone] 08/27/23  Patient Care Team: Marcine Matar, MD as PCP - General (Internal Medicine) Runell Gess, MD as PCP - Cardiology (Cardiology) Laurey Morale, MD as PCP - Advanced Heart Failure (Cardiology) Center, [facility]  Patient Active Problem List   Diagnosis Date Noted  . History of CVA (cerebrovascular accident) 03/22/2023  . Community acquired pneumonia 03/22/2023  . Acute respiratory failure with hypoxia (HCC) 11/04/2022  . Pain, unspecified 08/15/2022  . Complication of vascular dialysis catheter 08/07/2022  . Chest pain 05/30/2022  . Fluid overload 05/30/2022  . Acute pulmonary edema (HCC) 05/30/2022  . Iron deficiency anemia, unspecified 04/26/2022  . Moderate protein-calorie malnutrition (HCC) 04/18/2022  . Nausea 04/10/2022  . Hyponatremia 04/04/2022  . Prolonged QT interval 04/04/2022  . Anaphylactic shock, unspecified, initial encounter 02/23/2022  . Coagulation defect, unspecified (HCC) 02/23/2022  . Other allergy, initial encounter 02/23/2022  . Pressure injury of skin 02/19/2022  . Anemia in chronic kidney disease 02/16/2022  . 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 Hyperlipidemia, unspecified 02/16/2022  . 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 Personal history of nicotine dependence 02/16/2022  . Personal history of transient ischemic attack (TIA), and cerebral infarction without residual deficits 02/16/2022  . Secondary hyperparathyroidism of renal origin (HCC) 02/16/2022  . Type 2 diabetes mellitus with diabetic peripheral angiopathy without gangrene (HCC) 02/16/2022  . Renal anasarca 02/14/2022  . ESRD on dialysis (HCC) 02/14/2022  . Hyperkalemia 02/14/2022  . S/P arteriovenous (AV) fistula creation 12/22/2021  . Critical limb ischemia of both lower extremities (HCC) 11/22/2021  . Subacute osteomyelitis, right ankle and foot (HCC)   . Peripheral artery disease (HCC) 10/27/2021  . Acute exacerbation of congestive heart failure (HCC) 10/24/2021  . Acute cardiogenic pulmonary edema (HCC) 10/06/2021  . CAD (coronary artery disease) 10/06/2021  . Goals of care, counseling/discussion   . Gangrene of left foot (HCC)   . Cellulitis of left foot   . Diabetic foot ulcers (HCC) 09/04/2021  . CHF (congestive heart failure) (HCC) 07/07/2021  . Acute postoperative anemia due to expected blood loss superimposed on anemia of chronic renal insufficiency 06/30/2021  . CKD (chronic kidney disease), stage IV (HCC) 06/11/2021  . Chronic diastolic CHF (congestive heart failure) (HCC) 06/10/2021  . Abnormal nuclear stress test   . Dyspnea on exertion 03/07/2021  . Orthopnea 02/25/2021  . History of cerebrovascular accident (CVA) with residual deficit 05/06/2020  . Diabetic peripheral neuropathy (HCC) 04/26/2020  . AKI (acute kidney injury) (HCC) 04/20/2020  . Generalized weakness 04/20/2020  . Dehydration 04/20/2020  . Generalized abdominal pain   . Pancreatitis, acute 02/29/2020  . Cerebrovascular accident (CVA) (HCC) 11/08/2019  . Stenosis of right  carotid artery 11/08/2019  . Mixed diabetic hyperlipidemia associated with type 2 diabetes mellitus (HCC)  11/08/2019  . Cortical age-related cataract of both eyes 08/30/2019  . Gait abnormality 08/30/2019  . Statin declined 08/30/2019  . Gastroesophageal reflux disease without esophagitis 04/18/2018  . 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 Insulin dependent type 2 diabetes mellitus (HCC) 10/29/2017  . History of macular degeneration 10/29/2017  . Chronic cervical pain 10/29/2016  . Myalgia 09/14/2016  . Insomnia 09/14/2016  . Tinea pedis of both feet 08/20/2016  . Macular degeneration 08/17/2016  . Low back pain 09/21/2014  . Hypertensive urgency 09/21/2014  . Diabetic gastroparesis associated with type 2 diabetes mellitus (HCC) 09/14/2014  . HTN (hypertension) 09/08/2014  . Thyroid nodule 08/17/2014  . Morbid obesity (HCC) 08/17/2014  . Hypokalemia 08/06/2014  . Type II diabetes mellitus with neurological manifestations, uncontrolled 08/04/2014    Current Outpatient Medications:  .  albuterol (VENTOLIN HFA) 108 (90 Base) MCG/ACT inhaler, Inhale 2 puffs into the lungs every 6 (six) hours as needed., Disp: 18 g, Rfl: 3 .  amLODipine (NORVASC) 10 MG tablet, Take 1 tablet (10 mg total) by mouth daily for hypertension, Disp: 90 tablet, Rfl: 1 .  atorvastatin (LIPITOR) 40 MG tablet, Take 1 tablet (40 mg total) by mouth daily for antilipids, Disp: 90 tablet, Rfl: 1 .  benzonatate (TESSALON) 100 MG capsule, Take 1 capsule (100 mg total) by mouth 3 (three) times daily as needed for cough., Disp: 21 capsule, Rfl: 0 .  ezetimibe (ZETIA) 10 MG tablet, Take 1 tablet (10 mg total) by mouth daily for antilipids, Disp: 90 tablet, Rfl: 1 .  isosorbide mononitrate (IMDUR) 30 MG 24 hr tablet, Take 2 tablets (60 mg total) by mouth daily for blood pressure, Disp: 180 tablet, Rfl: 1 .  labetalol (NORMODYNE) 300 MG tablet, Take 1 tablet (300 mg total) by mouth 2 (two) times daily for beta blockers, Disp: 180 tablet, Rfl: 1 .  Accu-Chek Softclix Lancets lancets, Use to check blood sugar 3 times daily., Disp: 100 each,  Rfl: 6 .  aspirin 81 MG EC tablet, Take 1 tablet (81 mg total) by mouth daily. (Patient not taking: Reported on 08/20/2023), Disp: 60 tablet, Rfl: 1 .  Blood Glucose Monitoring Suppl (ACCU-CHEK GUIDE) w/Device KIT, Use to check blood sugar 3 times daily., Disp: 1 kit, Rfl: 0 .  Continuous Glucose Receiver (DEXCOM G6 RECEIVER) DEVI, 1 Device by Does not apply route daily. (Patient not taking: Reported on 08/20/2023), Disp: 1 each, Rfl: 0 .  Continuous Glucose Sensor (DEXCOM G6 SENSOR) MISC, Use as directed daily., Disp: 3 each, Rfl: 11 .  Continuous Glucose Transmitter (DEXCOM G6 TRANSMITTER) MISC, use as directed daily., Disp: 1 each, Rfl: 4 .  glucose blood (ACCU-CHEK GUIDE) test strip, Use to check blood sugar 3 times daily., Disp: 100 each, Rfl: 6 .  Insulin Pen Needle 32G X 4 MM MISC, Use to inject insulin as directed., Disp: 100 each, Rfl: 4 .  levothyroxine (SYNTHROID) 25 MCG tablet, Take 1 tablet (25 mcg total) by mouth daily for low thyroid hormone, Disp: 90 tablet, Rfl: 1 .  Multiple Vitamin (MULTI-VITAMIN DAILY) TABS, Take 1 tablet by mouth at bedtime for supplementation (Patient not taking: Reported on 08/20/2023), Disp: 14 tablet, Rfl: 0 .  Multiple Vitamin (MULTIVITAMIN) tablet, Take 1 tablet by mouth at bedtime. (Patient not taking: Reported on 08/20/2023), Disp: , Rfl:  .  omeprazole (PRILOSEC) 20 MG capsule, Take 2 capsules (40 mg total) by mouth daily for  GERD, Disp: 180 capsule, Rfl: 1 .  ondansetron (ZOFRAN) 8 MG tablet, Take 1 tablet (8 mg total) by mouth every 8 (eight) hours as needed for nausea, Disp: 42 tablet, Rfl: 0 .  polyethylene glycol (MIRALAX / GLYCOLAX) 17 g packet, Give 17 gram by mouth one time a day for constipation (Patient not taking: Reported on 08/20/2023), Disp: 14 packet, Rfl: 0 .  senna-docusate (STOOL SOFTENER/LAXATIVE) 8.6-50 MG tablet, Take 1 tablet by mouth at bedtime for constipation (Patient not taking: Reported on 08/20/2023), Disp: 14 tablet, Rfl: 0 .  sevelamer  carbonate (RENVELA) 800 MG tablet, Take 2 tablets (1,600 mg total) by mouth 3 (three) times daily., Disp: 126 tablet, Rfl: 0 .  Zinc 220 (50 Zn) MG CAPS, Take 1 capsule by mouth daily for minerals, electrolytes (Patient not taking: Reported on 08/20/2023), Disp: 14 capsule, Rfl: 0 Allergies  Allergen Reactions  . Hydralazine Other (See Comments)  . Hydralazine Hcl Other (See Comments)    Hair loss  . Hydrocodone Itching and Other (See Comments)    Upset stomach  . Metformin And Related Nausea And Vomiting and Other (See Comments)    Stomach pains, also  . Other Nausea Only and Other (See Comments)    Lettuce- Does not digest this!!  . Plaquenil [Hydroxychloroquine Sulfate] Hives  . Shellfish Allergy Nausea And Vomiting  . Shellfish-Derived Products Nausea Only and Other (See Comments)    Caused an upset stomach  . Shrimp (Diagnostic) Nausea Only and Other (See Comments)    Upset stomach   . Sulfa Antibiotics Hives     Social History   Socioeconomic History  . Marital status: Legally Separated    Spouse name: [name]  . Number of children: 1  . Years of education: some colle  . Highest education level: Not on file  Occupational History  . Occupation: Employed FT as Warden/ranger    Comment: [employer] , NA  Tobacco Use  . Smoking status: Former    Current packs/day: 0.25    Average packs/day: 0.3 packs/day for 0.5 years (0.1 ttl pk-yrs)    Types: Cigarettes    Passive exposure: Never  . Smokeless tobacco: Never  . Tobacco comments:    Former smoker 11/03/21  Vaping Use  . Vaping status: Never Used  Substance and Sexual Activity  . Alcohol use: No  . Drug use: No  . Sexual activity: Not Currently    Birth control/protection: None  Other Topics Concern  . 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 Difficulty of Paying Living Expenses: Not very hard  Recent Concern: Financial Resource Strain - High Risk (07/11/2021)   Overall Financial Resource Strain (CARDIA)   . Difficulty of Paying Living Expenses: Hard  Food Insecurity: No Food Insecurity (09/28/2021)   Hunger Vital Sign   . Worried About Programme researcher, broadcasting/film/video in the Last Year: Never true   . Ran Out of Food in the Last Year: Never true  Recent Concern: Food Insecurity - Food Insecurity Present (07/11/2021)   Hunger Vital Sign   . Worried About Programme researcher, broadcasting/film/video in the Last Year: Sometimes true   . Ran Out of Food in the Last Year: Sometimes true  Transportation Needs: No Transportation Needs (09/28/2021)   PRAPARE -  Transportation   . Lack of Transportation (Medical): No   . Lack of Transportation (Non-Medical): No  Physical Activity: Not on file  Stress: Not on file  Social Connections: Not on file  Intimate Partner Violence: Not on file    Physical Exam      No future appointments.

## 2023-08-28 ENCOUNTER — Other Ambulatory Visit (HOSPITAL_COMMUNITY): Payer: Self-pay

## 2023-08-28 ENCOUNTER — Telehealth (HOSPITAL_COMMUNITY): Payer: Self-pay

## 2023-08-28 ENCOUNTER — Telehealth: Payer: Self-pay | Admitting: Internal Medicine

## 2023-08-28 NOTE — Telephone Encounter (Signed)
Spoke with patient' husband , [name]  verified that he is on the [place]. Patient is at dialysis today. Advised that [facility] have been trying to contact the patient. Demorie contact information given to [employer].

## 2023-08-28 NOTE — Telephone Encounter (Signed)
Made in error

## 2023-08-28 NOTE — Telephone Encounter (Signed)
Demorie with [facility] is calling, she received a referral for pt to have personal care services. Demorie states that the pt phone number is going to voice mail when she calls the pt. Demorie is wanting to see if anyone from the office is able to get in touch with the pt and have her call Demorie at [facility]   [phone] Ext [phone]

## 2023-09-03 ENCOUNTER — Other Ambulatory Visit (HOSPITAL_COMMUNITY): Payer: Self-pay | Admitting: Emergency Medicine

## 2023-09-03 ENCOUNTER — Other Ambulatory Visit (HOSPITAL_COMMUNITY): Payer: Self-pay

## 2023-09-03 NOTE — Progress Notes (Signed)
Paramedicine Encounter    Patient ID: Nancy [name], female    DOB: 1961/04/22, 62 y.o.   MRN: [id number]   Complaints NONE  Assessment A&O x 4, skin W&D w/ good color.  Lung sounds clear and equal throughout w/ no SOB.  No edema noted.  Compliance with meds Missed one noon dose of Renvela  Pill box filled x 1 week  Refills needed Sevelamer Carbonate 800mg . Called in to pharmacy.  Meds changes since last visit NONE    Social changes NONE   BP (!) 154/96 (BP Location: Left Arm, Patient Position: Sitting, Cuff Size: Normal)   Pulse 88   Resp 16   Wt 224 lb (101.6 kg)   SpO2 98%   BMI 39.68 kg/m  Weight yesterday-not taken Last visit weight-235lb  ATF Nancy [name] sitting in her wheelchair in her living room.  She was smiling and stating that she "felt pretty good."  She was not wearing her home O2.  She stated she didn't feel like she needed it at this time. She missed a noon dose of her Renvela.  She has no complaints of chest pain or SOB.  No edema noted.  Lung sounds clear and equal bilat.  Med box reconciled x 1 week.  She was short several doses of her Renvela and I wrote a note listing which days needed meds and reviewed with pt and her husband and they both advise they understand and I encouraged them to reach out with questions should they have any. No pending appointments.  Next home visit scheduled for 9/24 @ 13:00.   ACTION: Home visit completed  Nancy [name] [phone] 09/03/23  Patient Care Team: Marcine Matar, MD as PCP - General (Internal Medicine) Runell Gess, MD as PCP - Cardiology (Cardiology) Laurey Morale, MD as PCP - Advanced Heart Failure (Cardiology) Center, [facility]  Patient Active Problem List   Diagnosis Date Noted   History of CVA (cerebrovascular accident) 03/22/2023   Community acquired pneumonia 03/22/2023   Acute respiratory failure with hypoxia (HCC) 11/04/2022   Pain, unspecified 08/15/2022    Complication of vascular dialysis catheter 08/07/2022   Chest pain 05/30/2022   Fluid overload 05/30/2022   Acute pulmonary edema (HCC) 05/30/2022   Iron deficiency anemia, unspecified 04/26/2022   Moderate protein-calorie malnutrition (HCC) 04/18/2022   Nausea 04/10/2022   Hyponatremia 04/04/2022   Prolonged QT interval 04/04/2022   Anaphylactic shock, unspecified, initial encounter 02/23/2022   Coagulation defect, unspecified (HCC) 02/23/2022   Other allergy, initial encounter 02/23/2022   Pressure injury of skin 02/19/2022   Anemia in chronic kidney disease 02/16/2022   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) 02/16/2022   Hyperlipidemia, unspecified 02/16/2022   Hypertensive heart and chronic kidney disease with heart failure and with stage 5 chronic kidney disease, or end stage renal disease (HCC) 02/16/2022   Personal history of nicotine dependence 02/16/2022   Personal history of transient ischemic attack (TIA), and cerebral infarction without residual deficits 02/16/2022   Secondary hyperparathyroidism of renal origin (HCC) 02/16/2022   Type 2 diabetes mellitus with diabetic peripheral angiopathy without gangrene (HCC) 02/16/2022   Renal anasarca 02/14/2022   ESRD on dialysis (HCC) 02/14/2022   Hyperkalemia 02/14/2022   S/P arteriovenous (AV) fistula creation 12/22/2021   Critical limb ischemia of both lower extremities (HCC) 11/22/2021   Subacute osteomyelitis, right ankle and foot (HCC)    Peripheral artery disease (HCC) 10/27/2021   Acute exacerbation of congestive heart failure (HCC) 10/24/2021  Acute cardiogenic pulmonary edema (HCC) 10/06/2021   CAD (coronary artery disease) 10/06/2021   Goals of care, counseling/discussion    Gangrene of left foot (HCC)    Cellulitis of left foot    Diabetic foot ulcers (HCC) 09/04/2021   CHF (congestive heart failure) (HCC) 07/07/2021   Acute postoperative anemia due to expected blood loss superimposed on anemia of chronic renal  insufficiency 06/30/2021   CKD (chronic kidney disease), stage IV (HCC) 06/11/2021   Chronic diastolic CHF (congestive heart failure) (HCC) 06/10/2021   Abnormal nuclear stress test    Dyspnea on exertion 03/07/2021   Orthopnea 02/25/2021   History of cerebrovascular accident (CVA) with residual deficit 05/06/2020   Diabetic peripheral neuropathy (HCC) 04/26/2020   AKI (acute kidney injury) (HCC) 04/20/2020   Generalized weakness 04/20/2020   Dehydration 04/20/2020   Generalized abdominal pain    Pancreatitis, acute 02/29/2020   Cerebrovascular accident (CVA) (HCC) 11/08/2019   Stenosis of right carotid artery 11/08/2019   Mixed diabetic hyperlipidemia associated with type 2 diabetes mellitus (HCC) 11/08/2019   Cortical age-related cataract of both eyes 08/30/2019   Gait abnormality 08/30/2019   Statin declined 08/30/2019   Gastroesophageal reflux disease without esophagitis 04/18/2018   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   Insulin dependent type 2 diabetes mellitus (HCC) 10/29/2017   History of macular degeneration 10/29/2017   Chronic cervical pain 10/29/2016   Myalgia 09/14/2016   Insomnia 09/14/2016   Tinea pedis of both feet 08/20/2016   Macular degeneration 08/17/2016   Low back pain 09/21/2014   Hypertensive urgency 09/21/2014   Diabetic gastroparesis associated with type 2 diabetes mellitus (HCC) 09/14/2014   HTN (hypertension) 09/08/2014   Thyroid nodule 08/17/2014   Morbid obesity (HCC) 08/17/2014   Hypokalemia 08/06/2014   Type II diabetes mellitus with neurological manifestations, uncontrolled 08/04/2014    Current Outpatient Medications:    albuterol (VENTOLIN HFA) 108 (90 Base) MCG/ACT inhaler, Inhale 2 puffs into the lungs every 6 (six) hours as needed., Disp: 18 g, Rfl: 3   amLODipine (NORVASC) 10 MG tablet, Take 1 tablet (10 mg total) by mouth daily for hypertension, Disp: 90 tablet, Rfl: 1   aspirin 81 MG EC tablet, Take 1 tablet (81 mg total) by mouth daily., Disp:  60 tablet, Rfl: 1   atorvastatin (LIPITOR) 40 MG tablet, Take 1 tablet (40 mg total) by mouth daily for antilipids, Disp: 90 tablet, Rfl: 1   benzonatate (TESSALON) 100 MG capsule, Take 1 capsule (100 mg total) by mouth 3 (three) times daily as needed for cough., Disp: 21 capsule, Rfl: 0   ezetimibe (ZETIA) 10 MG tablet, Take 1 tablet (10 mg total) by mouth daily for antilipids, Disp: 90 tablet, Rfl: 1   isosorbide mononitrate (IMDUR) 30 MG 24 hr tablet, Take 2 tablets (60 mg total) by mouth daily for blood pressure, Disp: 180 tablet, Rfl: 1   labetalol (NORMODYNE) 300 MG tablet, Take 1 tablet (300 mg total) by mouth 2 (two) times daily for beta blockers, Disp: 180 tablet, Rfl: 1   levothyroxine (SYNTHROID) 25 MCG tablet, Take 1 tablet (25 mcg total) by mouth daily for low thyroid hormone, Disp: 90 tablet, Rfl: 1   omeprazole (PRILOSEC) 20 MG capsule, Take 2 capsules (40 mg total) by mouth daily for GERD, Disp: 180 capsule, Rfl: 1   ondansetron (ZOFRAN) 8 MG tablet, Take 1 tablet (8 mg total) by mouth every 8 (eight) hours as needed for nausea, Disp: 42 tablet, Rfl: 0   sevelamer carbonate (RENVELA) 800 MG  tablet, Take 2 tablets (1,600 mg total) by mouth 3 (three) times daily., Disp: 126 tablet, Rfl: 0   Accu-Chek Softclix Lancets lancets, Use to check blood sugar 3 times daily. (Patient not taking: Reported on 08/28/2023), Disp: 100 each, Rfl: 6   Blood Glucose Monitoring Suppl (ACCU-CHEK GUIDE) w/Device KIT, Use to check blood sugar 3 times daily. (Patient not taking: Reported on 08/28/2023), Disp: 1 kit, Rfl: 0   Continuous Glucose Receiver (DEXCOM G6 RECEIVER) DEVI, 1 Device by Does not apply route daily. (Patient not taking: Reported on 08/20/2023), Disp: 1 each, Rfl: 0   Continuous Glucose Sensor (DEXCOM G6 SENSOR) MISC, Use as directed daily. (Patient not taking: Reported on 08/28/2023), Disp: 3 each, Rfl: 11   Continuous Glucose Transmitter (DEXCOM G6 TRANSMITTER) MISC, use as directed daily.  (Patient not taking: Reported on 08/28/2023), Disp: 1 each, Rfl: 4   glucose blood (ACCU-CHEK GUIDE) test strip, Use to check blood sugar 3 times daily. (Patient not taking: Reported on 08/28/2023), Disp: 100 each, Rfl: 6   Insulin Pen Needle 32G X 4 MM MISC, Use to inject insulin as directed., Disp: 100 each, Rfl: 4   Multiple Vitamin (MULTI-VITAMIN DAILY) TABS, Take 1 tablet by mouth at bedtime for supplementation (Patient not taking: Reported on 08/20/2023), Disp: 14 tablet, Rfl: 0   Multiple Vitamin (MULTIVITAMIN) tablet, Take 1 tablet by mouth at bedtime. (Patient not taking: Reported on 08/20/2023), Disp: , Rfl:    polyethylene glycol (MIRALAX / GLYCOLAX) 17 g packet, Give 17 gram by mouth one time a day for constipation (Patient not taking: Reported on 08/20/2023), Disp: 14 packet, Rfl: 0   senna-docusate (STOOL SOFTENER/LAXATIVE) 8.6-50 MG tablet, Take 1 tablet by mouth at bedtime for constipation (Patient not taking: Reported on 08/20/2023), Disp: 14 tablet, Rfl: 0   Zinc 220 (50 Zn) MG CAPS, Take 1 capsule by mouth daily for minerals, electrolytes (Patient not taking: Reported on 08/20/2023), Disp: 14 capsule, Rfl: 0 Allergies  Allergen Reactions   Hydralazine Other (See Comments)   Hydralazine Hcl Other (See Comments)    Hair loss   Hydrocodone Itching and Other (See Comments)    Upset stomach   Metformin And Related Nausea And Vomiting and Other (See Comments)    Stomach pains, also   Other Nausea Only and Other (See Comments)    Lettuce- Does not digest this!!   Plaquenil [Hydroxychloroquine Sulfate] Hives   Shellfish Allergy Nausea And Vomiting   Shellfish-Derived Products Nausea Only and Other (See Comments)    Caused an upset stomach   Shrimp (Diagnostic) Nausea Only and Other (See Comments)    Upset stomach    Sulfa Antibiotics Hives     Social History   Socioeconomic History   Marital status: Legally Separated    Spouse name: [name]   Number of children: 1   Years of  education: some colle   Highest education level: Not on file  Occupational History   Occupation: Employed FT as Warden/ranger    Comment: [employer] , NA  Tobacco Use   Smoking status: Former    Current packs/day: 0.25    Average packs/day: 0.3 packs/day for 0.5 years (0.1 ttl pk-yrs)    Types: Cigarettes    Passive exposure: Never   Smokeless tobacco: Never   Tobacco comments:    Former smoker 11/03/21  Vaping Use   Vaping status: Never Used  Substance and Sexual Activity   Alcohol use: No   Drug use: No   Sexual activity: Not  Currently    Birth control/protection: None  Other Topics Concern   Not on file  Social History Narrative   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    Social Determinants of Health   Financial Resource Strain: Low Risk  (09/28/2021)   Overall Financial Resource Strain (CARDIA)    Difficulty of Paying Living Expenses: Not very hard  Recent Concern: Physicist, medical Strain - High Risk (07/11/2021)   Overall Financial Resource Strain (CARDIA)    Difficulty of Paying Living Expenses: Hard  Food Insecurity: No Food Insecurity (09/28/2021)   Hunger Vital Sign    Worried About Running Out of Food in the Last Year: Never true    Ran Out of Food in the Last Year: Never true  Recent Concern: Food Insecurity - Food Insecurity Present (07/11/2021)   Hunger Vital Sign    Worried About Running Out of Food in the Last Year: Sometimes true    Ran Out of Food in the Last Year: Sometimes true  Transportation Needs: No Transportation Needs (09/28/2021)   PRAPARE - Administrator, Civil Service (Medical): No    Lack of Transportation (Non-Medical): No  Physical Activity: Not on file  Stress: Not on file  Social Connections: Not on file  Intimate Partner Violence: Not on file    Physical Exam      Future Appointments  Date Time Provider Department Center  09/26/2023 11:00 AM Victorino Sparrow, MD [facility]

## 2023-09-04 ENCOUNTER — Other Ambulatory Visit (HOSPITAL_COMMUNITY): Payer: Self-pay

## 2023-09-06 ENCOUNTER — Other Ambulatory Visit (HOSPITAL_COMMUNITY): Payer: Self-pay

## 2023-09-06 ENCOUNTER — Other Ambulatory Visit: Payer: Self-pay

## 2023-09-06 ENCOUNTER — Telehealth (HOSPITAL_COMMUNITY): Payer: Self-pay

## 2023-09-06 MED ORDER — SEVELAMER CARBONATE 800 MG PO TABS
2400.0000 mg | ORAL_TABLET | Freq: Three times a day (TID) | ORAL | 3 refills | Status: AC
Start: 2023-09-04 — End: ?
  Filled 2023-09-06: qty 1080, 90d supply, fill #0
  Filled 2023-09-06: qty 40, 4d supply, fill #0
  Filled 2023-09-06: qty 1080, 120d supply, fill #0
  Filled 2023-09-06: qty 950, 86d supply, fill #0
  Filled 2023-09-06: qty 900, 90d supply, fill #0
  Filled 2023-12-02 – 2023-12-10 (×7): qty 990, 90d supply, fill #1

## 2023-09-06 NOTE — Telephone Encounter (Signed)
Spoke to [employer] at [facility] to see if they had received same and they report the medication was filled on 9/18 from an outside mail order pharmacy and in order for her to have it filled through [facility] Mrs. Burright would need to call her insurance to have them complete an "awaiting mail order override". I was unable to reach Mrs. Kral to advise her of same so I made her community paramedic Beatrix Shipper aware.   Maralyn Sago, EMT-Paramedic [phone] 09/06/2023

## 2023-09-06 NOTE — Telephone Encounter (Signed)
Contacted the D[facility] Center at [address] [phone]) to request refills for Nancy [name] Renvela to be sent to [facility]. The RN advised they would contact the pharmacy directly.   Call complete.   Maralyn Sago, EMT-Paramedic [phone] 09/06/2023

## 2023-09-09 ENCOUNTER — Other Ambulatory Visit: Payer: Self-pay

## 2023-09-09 ENCOUNTER — Other Ambulatory Visit (HOSPITAL_COMMUNITY): Payer: Self-pay

## 2023-09-10 ENCOUNTER — Other Ambulatory Visit: Payer: Self-pay

## 2023-09-10 ENCOUNTER — Other Ambulatory Visit (HOSPITAL_COMMUNITY): Payer: Self-pay | Admitting: Emergency Medicine

## 2023-09-10 NOTE — Progress Notes (Addendum)
Paramedicine Encounter    Patient ID: Nancy [name], female    DOB: 1961/09/13, 62 y.o.   MRN: [id number]   Complaints NONE  Assessment A&O x 4, skin W&D w/ good color.  Lung sounds clear throughout.  No edema noted.  Compliance with meds YES  Pill box filled x 1 week  Refills needed renvela [id number]  Meds changes since last visit increased Renvela to 3 Tabs TID.    Social changes NONE   BP (!) 140/80 (BP Location: Left Arm, Patient Position: Sitting, Cuff Size: Normal)   Pulse 88   Resp 16   SpO2 98%  Weight yesterday- not taken Last visit weight-224lb  Home visit today finds Nancy [name] w/o complaint.  She has been compliant with all her meds.  She denies chest pain or SOB.  She was not wearing her home O2 and when I asked her about it she stated she really doesn't need it unless she's moving around. Lung sounds clear and equal throughout.  No edema noted. Med box reconciled x 1 week.  Her Renvela dose has increased to 3 tablets TID.  The pharmacy only had 40 qty of this med at last refill.  Her box  needs this med in Sun/Mon TID and Tues a.m.  and then Sat p.m. only.  I spoke with the pharmacy and they advised they will call the pt when the refills are ready to pick up.  Pt. Advises she feels she can fill these on her own. Also during today's visit assisted pt w/ completing application for food stamps.  Nancy [name] emailed me PDF files of his pay stubs for the application. Next home visit scheduled for 09/17/23 @ 1:15.   ACTION: Home visit completed  Bethanie Dicker [phone] 09/10/23  Patient Care Team: Marcine Matar, MD as PCP - General (Internal Medicine) Runell Gess, MD as PCP - Cardiology (Cardiology) Laurey Morale, MD as PCP - Advanced Heart Failure (Cardiology) Center, [facility]  Patient Active Problem List   Diagnosis Date Noted   History of CVA (cerebrovascular accident) 03/22/2023   Community acquired pneumonia  03/22/2023   Acute respiratory failure with hypoxia (HCC) 11/04/2022   Pain, unspecified 08/15/2022   Complication of vascular dialysis catheter 08/07/2022   Chest pain 05/30/2022   Fluid overload 05/30/2022   Acute pulmonary edema (HCC) 05/30/2022   Iron deficiency anemia, unspecified 04/26/2022   Moderate protein-calorie malnutrition (HCC) 04/18/2022   Nausea 04/10/2022   Hyponatremia 04/04/2022   Prolonged QT interval 04/04/2022   Anaphylactic shock, unspecified, initial encounter 02/23/2022   Coagulation defect, unspecified (HCC) 02/23/2022   Other allergy, initial encounter 02/23/2022   Pressure injury of skin 02/19/2022   Anemia in chronic kidney disease 02/16/2022   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) 02/16/2022   Hyperlipidemia, unspecified 02/16/2022   Hypertensive heart and chronic kidney disease with heart failure and with stage 5 chronic kidney disease, or end stage renal disease (HCC) 02/16/2022   Personal history of nicotine dependence 02/16/2022   Personal history of transient ischemic attack (TIA), and cerebral infarction without residual deficits 02/16/2022   Secondary hyperparathyroidism of renal origin (HCC) 02/16/2022   Type 2 diabetes mellitus with diabetic peripheral angiopathy without gangrene (HCC) 02/16/2022   Renal anasarca 02/14/2022   ESRD on dialysis (HCC) 02/14/2022   Hyperkalemia 02/14/2022   S/P arteriovenous (AV) fistula creation 12/22/2021   Critical limb ischemia of both lower extremities (HCC) 11/22/2021   Subacute osteomyelitis, right ankle and foot (HCC)  Peripheral artery disease (HCC) 10/27/2021   Acute exacerbation of congestive heart failure (HCC) 10/24/2021   Acute cardiogenic pulmonary edema (HCC) 10/06/2021   CAD (coronary artery disease) 10/06/2021   Goals of care, counseling/discussion    Gangrene of left foot (HCC)    Cellulitis of left foot    Diabetic foot ulcers (HCC) 09/04/2021   CHF (congestive heart failure) (HCC) 07/07/2021    Acute postoperative anemia due to expected blood loss superimposed on anemia of chronic renal insufficiency 06/30/2021   CKD (chronic kidney disease), stage IV (HCC) 06/11/2021   Chronic diastolic CHF (congestive heart failure) (HCC) 06/10/2021   Abnormal nuclear stress test    Dyspnea on exertion 03/07/2021   Orthopnea 02/25/2021   History of cerebrovascular accident (CVA) with residual deficit 05/06/2020   Diabetic peripheral neuropathy (HCC) 04/26/2020   AKI (acute kidney injury) (HCC) 04/20/2020   Generalized weakness 04/20/2020   Dehydration 04/20/2020   Generalized abdominal pain    Pancreatitis, acute 02/29/2020   Cerebrovascular accident (CVA) (HCC) 11/08/2019   Stenosis of right carotid artery 11/08/2019   Mixed diabetic hyperlipidemia associated with type 2 diabetes mellitus (HCC) 11/08/2019   Cortical age-related cataract of both eyes 08/30/2019   Gait abnormality 08/30/2019   Statin declined 08/30/2019   Gastroesophageal reflux disease without esophagitis 04/18/2018   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   Insulin dependent type 2 diabetes mellitus (HCC) 10/29/2017   History of macular degeneration 10/29/2017   Chronic cervical pain 10/29/2016   Myalgia 09/14/2016   Insomnia 09/14/2016   Tinea pedis of both feet 08/20/2016   Macular degeneration 08/17/2016   Low back pain 09/21/2014   Hypertensive urgency 09/21/2014   Diabetic gastroparesis associated with type 2 diabetes mellitus (HCC) 09/14/2014   HTN (hypertension) 09/08/2014   Thyroid nodule 08/17/2014   Morbid obesity (HCC) 08/17/2014   Hypokalemia 08/06/2014   Type II diabetes mellitus with neurological manifestations, uncontrolled 08/04/2014    Current Outpatient Medications:    albuterol (VENTOLIN HFA) 108 (90 Base) MCG/ACT inhaler, Inhale 2 puffs into the lungs every 6 (six) hours as needed., Disp: 18 g, Rfl: 3   amLODipine (NORVASC) 10 MG tablet, Take 1 tablet (10 mg total) by mouth daily for hypertension, Disp:  90 tablet, Rfl: 1   aspirin 81 MG EC tablet, Take 1 tablet (81 mg total) by mouth daily., Disp: 60 tablet, Rfl: 1   atorvastatin (LIPITOR) 40 MG tablet, Take 1 tablet (40 mg total) by mouth daily for antilipids, Disp: 90 tablet, Rfl: 1   benzonatate (TESSALON) 100 MG capsule, Take 1 capsule (100 mg total) by mouth 3 (three) times daily as needed for cough., Disp: 21 capsule, Rfl: 0   ezetimibe (ZETIA) 10 MG tablet, Take 1 tablet (10 mg total) by mouth daily for antilipids, Disp: 90 tablet, Rfl: 1   isosorbide mononitrate (IMDUR) 30 MG 24 hr tablet, Take 2 tablets (60 mg total) by mouth daily for blood pressure, Disp: 180 tablet, Rfl: 1   labetalol (NORMODYNE) 300 MG tablet, Take 1 tablet (300 mg total) by mouth 2 (two) times daily for beta blockers, Disp: 180 tablet, Rfl: 1   levothyroxine (SYNTHROID) 25 MCG tablet, Take 1 tablet (25 mcg total) by mouth daily for low thyroid hormone, Disp: 90 tablet, Rfl: 1   omeprazole (PRILOSEC) 20 MG capsule, Take 2 capsules (40 mg total) by mouth daily for GERD, Disp: 180 capsule, Rfl: 1   ondansetron (ZOFRAN) 8 MG tablet, Take 1 tablet (8 mg total) by mouth every 8 (eight)  hours as needed for nausea, Disp: 42 tablet, Rfl: 0   sevelamer carbonate (RENVELA) 800 MG tablet, Take 3 tablets (2,400 mg total) by mouth 3 (three) times daily with meals and 1 tablet with snacks., Disp: 1080 tablet, Rfl: 3   Accu-Chek Softclix Lancets lancets, Use to check blood sugar 3 times daily. (Patient not taking: Reported on 08/28/2023), Disp: 100 each, Rfl: 6   Blood Glucose Monitoring Suppl (ACCU-CHEK GUIDE) w/Device KIT, Use to check blood sugar 3 times daily. (Patient not taking: Reported on 08/28/2023), Disp: 1 kit, Rfl: 0   Continuous Glucose Receiver (DEXCOM G6 RECEIVER) DEVI, 1 Device by Does not apply route daily. (Patient not taking: Reported on 08/20/2023), Disp: 1 each, Rfl: 0   Continuous Glucose Sensor (DEXCOM G6 SENSOR) MISC, Use as directed daily. (Patient not taking:  Reported on 08/28/2023), Disp: 3 each, Rfl: 11   Continuous Glucose Transmitter (DEXCOM G6 TRANSMITTER) MISC, use as directed daily. (Patient not taking: Reported on 08/28/2023), Disp: 1 each, Rfl: 4   glucose blood (ACCU-CHEK GUIDE) test strip, Use to check blood sugar 3 times daily. (Patient not taking: Reported on 08/28/2023), Disp: 100 each, Rfl: 6   Insulin Pen Needle 32G X 4 MM MISC, Use to inject insulin as directed., Disp: 100 each, Rfl: 4   Multiple Vitamin (MULTI-VITAMIN DAILY) TABS, Take 1 tablet by mouth at bedtime for supplementation (Patient not taking: Reported on 08/20/2023), Disp: 14 tablet, Rfl: 0   Multiple Vitamin (MULTIVITAMIN) tablet, Take 1 tablet by mouth at bedtime. (Patient not taking: Reported on 08/20/2023), Disp: , Rfl:    polyethylene glycol (MIRALAX / GLYCOLAX) 17 g packet, Give 17 gram by mouth one time a day for constipation (Patient not taking: Reported on 08/20/2023), Disp: 14 packet, Rfl: 0   senna-docusate (STOOL SOFTENER/LAXATIVE) 8.6-50 MG tablet, Take 1 tablet by mouth at bedtime for constipation (Patient not taking: Reported on 08/20/2023), Disp: 14 tablet, Rfl: 0   sevelamer carbonate (RENVELA) 800 MG tablet, Take 2 tablets (1,600 mg total) by mouth 3 (three) times daily., Disp: 126 tablet, Rfl: 0   Zinc 220 (50 Zn) MG CAPS, Take 1 capsule by mouth daily for minerals, electrolytes (Patient not taking: Reported on 08/20/2023), Disp: 14 capsule, Rfl: 0 Allergies  Allergen Reactions   Hydralazine Other (See Comments)   Hydralazine Hcl Other (See Comments)    Hair loss   Hydrocodone Itching and Other (See Comments)    Upset stomach   Metformin And Related Nausea And Vomiting and Other (See Comments)    Stomach pains, also   Other Nausea Only and Other (See Comments)    Lettuce- Does not digest this!!   Plaquenil [Hydroxychloroquine Sulfate] Hives   Shellfish Allergy Nausea And Vomiting   Shellfish-Derived Products Nausea Only and Other (See Comments)    Caused an  upset stomach   Shrimp (Diagnostic) Nausea Only and Other (See Comments)    Upset stomach    Sulfa Antibiotics Hives     Social History   Socioeconomic History   Marital status: Legally Separated    Spouse name: [name]   Number of children: 1   Years of education: some colle   Highest education level: Not on file  Occupational History   Occupation: Employed FT as Warden/ranger    Comment: [employer] , NA  Tobacco Use   Smoking status: Former    Current packs/day: 0.25    Average packs/day: 0.3 packs/day for 0.5 years (0.1 ttl pk-yrs)    Types: Cigarettes  Passive exposure: Never   Smokeless tobacco: Never   Tobacco comments:    Former smoker 11/03/21  Vaping Use   Vaping status: Never Used  Substance and Sexual Activity   Alcohol use: No   Drug use: No   Sexual activity: Not Currently    Birth control/protection: None  Other Topics Concern   Not on file  Social History Narrative   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    Social Determinants of Health   Financial Resource Strain: Low Risk  (09/28/2021)   Overall Financial Resource Strain (CARDIA)    Difficulty of Paying Living Expenses: Not very hard  Recent Concern: Physicist, medical Strain - High Risk (07/11/2021)   Overall Financial Resource Strain (CARDIA)    Difficulty of Paying Living Expenses: Hard  Food Insecurity: No Food Insecurity (09/28/2021)   Hunger Vital Sign    Worried About Running Out of Food in the Last Year: Never true    Ran Out of Food in the Last Year: Never true  Recent Concern: Food Insecurity - Food Insecurity Present (07/11/2021)   Hunger Vital Sign    Worried About Running Out of Food in the Last Year: Sometimes true    Ran Out of Food in the Last Year: Sometimes true  Transportation Needs: No Transportation Needs (09/28/2021)   PRAPARE - Administrator, Civil Service (Medical): No    Lack of Transportation (Non-Medical): No  Physical  Activity: Not on file  Stress: Not on file  Social Connections: Not on file  Intimate Partner Violence: Not on file    Physical Exam      Future Appointments  Date Time Provider Department Center  09/26/2023 11:00 AM Victorino Sparrow, MD [facility]

## 2023-09-11 ENCOUNTER — Other Ambulatory Visit (HOSPITAL_COMMUNITY): Payer: Self-pay

## 2023-09-17 ENCOUNTER — Other Ambulatory Visit (HOSPITAL_COMMUNITY): Payer: Self-pay | Admitting: Emergency Medicine

## 2023-09-17 NOTE — Progress Notes (Unsigned)
Paramedicine Encounter    Patient ID: Nancy [name], female    DOB: 1961/02/02, 62 y.o.   MRN: [id number]   Complaints  NONE  Assessment A&O x 4, skin W&D w/ good color.  No noted edema.  Lung sounds clear throughout   Compliance with meds YES  Pill box filled x 1week  Refills needed NONE  Meds changes since last visit NONE    Social changes NONE   BP (!) 180/90 (BP Location: Left Arm, Patient Position: Sitting, Cuff Size: Normal)   Pulse 92   Resp 16   Wt 237 lb 6.4 oz (107.7 kg)   SpO2 97%   BMI 42.05 kg/m  Weight yesterday-not taken Last visit weight-232.4lb  Pt. Blood pressure was elevated during today's visit.  She states she took her morning meds about 1 hr. Before my arrival because she slept late.  She denies headache, visual disturbances or weakness.  I will follow up with her for a repeat BP check tomorrow after she gets home from dialysis. Med box reconciled x 1 week. Next home visit will be 08/25/23 @ 13:15.  ACTION: Home visit completed  Bethanie Dicker [phone] 09/17/23  Patient Care Team: Marcine Matar, MD as PCP - General (Internal Medicine) Runell Gess, MD as PCP - Cardiology (Cardiology) Laurey Morale, MD as PCP - Advanced Heart Failure (Cardiology) Center, [facility]  Patient Active Problem List   Diagnosis Date Noted   History of CVA (cerebrovascular accident) 03/22/2023   Community acquired pneumonia 03/22/2023   Acute respiratory failure with hypoxia (HCC) 11/04/2022   Pain, unspecified 08/15/2022   Complication of vascular dialysis catheter 08/07/2022   Chest pain 05/30/2022   Fluid overload 05/30/2022   Acute pulmonary edema (HCC) 05/30/2022   Iron deficiency anemia, unspecified 04/26/2022   Moderate protein-calorie malnutrition (HCC) 04/18/2022   Nausea 04/10/2022   Hyponatremia 04/04/2022   Prolonged QT interval 04/04/2022   Anaphylactic shock, unspecified, initial encounter 02/23/2022    Coagulation defect, unspecified (HCC) 02/23/2022   Other allergy, initial encounter 02/23/2022   Pressure injury of skin 02/19/2022   Anemia in chronic kidney disease 02/16/2022   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) 02/16/2022   Hyperlipidemia, unspecified 02/16/2022   Hypertensive heart and chronic kidney disease with heart failure and with stage 5 chronic kidney disease, or end stage renal disease (HCC) 02/16/2022   Personal history of nicotine dependence 02/16/2022   Personal history of transient ischemic attack (TIA), and cerebral infarction without residual deficits 02/16/2022   Secondary hyperparathyroidism of renal origin (HCC) 02/16/2022   Type 2 diabetes mellitus with diabetic peripheral angiopathy without gangrene (HCC) 02/16/2022   Renal anasarca 02/14/2022   ESRD on dialysis (HCC) 02/14/2022   Hyperkalemia 02/14/2022   S/P arteriovenous (AV) fistula creation 12/22/2021   Critical limb ischemia of both lower extremities (HCC) 11/22/2021   Subacute osteomyelitis, right ankle and foot (HCC)    Peripheral artery disease (HCC) 10/27/2021   Acute exacerbation of congestive heart failure (HCC) 10/24/2021   Acute cardiogenic pulmonary edema (HCC) 10/06/2021   CAD (coronary artery disease) 10/06/2021   Goals of care, counseling/discussion    Gangrene of left foot (HCC)    Cellulitis of left foot    Diabetic foot ulcers (HCC) 09/04/2021   CHF (congestive heart failure) (HCC) 07/07/2021   Acute postoperative anemia due to expected blood loss superimposed on anemia of chronic renal insufficiency 06/30/2021   CKD (chronic kidney disease), stage IV (HCC) 06/11/2021   Chronic diastolic CHF (  congestive heart failure) (HCC) 06/10/2021   Abnormal nuclear stress test    Dyspnea on exertion 03/07/2021   Orthopnea 02/25/2021   History of cerebrovascular accident (CVA) with residual deficit 05/06/2020   Diabetic peripheral neuropathy (HCC) 04/26/2020   AKI (acute kidney injury) (HCC)  04/20/2020   Generalized weakness 04/20/2020   Dehydration 04/20/2020   Generalized abdominal pain    Pancreatitis, acute 02/29/2020   Cerebrovascular accident (CVA) (HCC) 11/08/2019   Stenosis of right carotid artery 11/08/2019   Mixed diabetic hyperlipidemia associated with type 2 diabetes mellitus (HCC) 11/08/2019   Cortical age-related cataract of both eyes 08/30/2019   Gait abnormality 08/30/2019   Statin declined 08/30/2019   Gastroesophageal reflux disease without esophagitis 04/18/2018   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   Insulin dependent type 2 diabetes mellitus (HCC) 10/29/2017   History of macular degeneration 10/29/2017   Chronic cervical pain 10/29/2016   Myalgia 09/14/2016   Insomnia 09/14/2016   Tinea pedis of both feet 08/20/2016   Macular degeneration 08/17/2016   Low back pain 09/21/2014   Hypertensive urgency 09/21/2014   Diabetic gastroparesis associated with type 2 diabetes mellitus (HCC) 09/14/2014   HTN (hypertension) 09/08/2014   Thyroid nodule 08/17/2014   Morbid obesity (HCC) 08/17/2014   Hypokalemia 08/06/2014   Type II diabetes mellitus with neurological manifestations, uncontrolled 08/04/2014    Current Outpatient Medications:    albuterol (VENTOLIN HFA) 108 (90 Base) MCG/ACT inhaler, Inhale 2 puffs into the lungs every 6 (six) hours as needed., Disp: 18 g, Rfl: 3   amLODipine (NORVASC) 10 MG tablet, Take 1 tablet (10 mg total) by mouth daily for hypertension, Disp: 90 tablet, Rfl: 1   aspirin 81 MG EC tablet, Take 1 tablet (81 mg total) by mouth daily., Disp: 60 tablet, Rfl: 1   atorvastatin (LIPITOR) 40 MG tablet, Take 1 tablet (40 mg total) by mouth daily for antilipids, Disp: 90 tablet, Rfl: 1   benzonatate (TESSALON) 100 MG capsule, Take 1 capsule (100 mg total) by mouth 3 (three) times daily as needed for cough., Disp: 21 capsule, Rfl: 0   ezetimibe (ZETIA) 10 MG tablet, Take 1 tablet (10 mg total) by mouth daily for antilipids, Disp: 90 tablet, Rfl:  1   isosorbide mononitrate (IMDUR) 30 MG 24 hr tablet, Take 2 tablets (60 mg total) by mouth daily for blood pressure, Disp: 180 tablet, Rfl: 1   labetalol (NORMODYNE) 300 MG tablet, Take 1 tablet (300 mg total) by mouth 2 (two) times daily for beta blockers, Disp: 180 tablet, Rfl: 1   levothyroxine (SYNTHROID) 25 MCG tablet, Take 1 tablet (25 mcg total) by mouth daily for low thyroid hormone, Disp: 90 tablet, Rfl: 1   omeprazole (PRILOSEC) 20 MG capsule, Take 2 capsules (40 mg total) by mouth daily for GERD, Disp: 180 capsule, Rfl: 1   ondansetron (ZOFRAN) 8 MG tablet, Take 1 tablet (8 mg total) by mouth every 8 (eight) hours as needed for nausea, Disp: 42 tablet, Rfl: 0   sevelamer carbonate (RENVELA) 800 MG tablet, Take 3 tablets (2,400 mg total) by mouth 3 (three) times daily with meals and 1 tablet with snacks., Disp: 1080 tablet, Rfl: 3   Accu-Chek Softclix Lancets lancets, Use to check blood sugar 3 times daily. (Patient not taking: Reported on 08/28/2023), Disp: 100 each, Rfl: 6   Blood Glucose Monitoring Suppl (ACCU-CHEK GUIDE) w/Device KIT, Use to check blood sugar 3 times daily. (Patient not taking: Reported on 08/28/2023), Disp: 1 kit, Rfl: 0   Continuous Glucose  Receiver (DEXCOM G6 RECEIVER) DEVI, 1 Device by Does not apply route daily. (Patient not taking: Reported on 08/20/2023), Disp: 1 each, Rfl: 0   Continuous Glucose Sensor (DEXCOM G6 SENSOR) MISC, Use as directed daily. (Patient not taking: Reported on 08/28/2023), Disp: 3 each, Rfl: 11   Continuous Glucose Transmitter (DEXCOM G6 TRANSMITTER) MISC, use as directed daily. (Patient not taking: Reported on 08/28/2023), Disp: 1 each, Rfl: 4   glucose blood (ACCU-CHEK GUIDE) test strip, Use to check blood sugar 3 times daily. (Patient not taking: Reported on 08/28/2023), Disp: 100 each, Rfl: 6   Insulin Pen Needle 32G X 4 MM MISC, Use to inject insulin as directed. (Patient not taking: Reported on 09/17/2023), Disp: 100 each, Rfl: 4   Multiple  Vitamin (MULTI-VITAMIN DAILY) TABS, Take 1 tablet by mouth at bedtime for supplementation (Patient not taking: Reported on 08/20/2023), Disp: 14 tablet, Rfl: 0   Multiple Vitamin (MULTIVITAMIN) tablet, Take 1 tablet by mouth at bedtime. (Patient not taking: Reported on 08/20/2023), Disp: , Rfl:    polyethylene glycol (MIRALAX / GLYCOLAX) 17 g packet, Give 17 gram by mouth one time a day for constipation (Patient not taking: Reported on 08/20/2023), Disp: 14 packet, Rfl: 0   senna-docusate (STOOL SOFTENER/LAXATIVE) 8.6-50 MG tablet, Take 1 tablet by mouth at bedtime for constipation (Patient not taking: Reported on 08/20/2023), Disp: 14 tablet, Rfl: 0   sevelamer carbonate (RENVELA) 800 MG tablet, Take 2 tablets (1,600 mg total) by mouth 3 (three) times daily., Disp: 126 tablet, Rfl: 0   Zinc 220 (50 Zn) MG CAPS, Take 1 capsule by mouth daily for minerals, electrolytes (Patient not taking: Reported on 08/20/2023), Disp: 14 capsule, Rfl: 0 Allergies  Allergen Reactions   Hydralazine Other (See Comments)   Hydralazine Hcl Other (See Comments)    Hair loss   Hydrocodone Itching and Other (See Comments)    Upset stomach   Metformin And Related Nausea And Vomiting and Other (See Comments)    Stomach pains, also   Other Nausea Only and Other (See Comments)    Lettuce- Does not digest this!!   Plaquenil [Hydroxychloroquine Sulfate] Hives   Shellfish Allergy Nausea And Vomiting   Shellfish-Derived Products Nausea Only and Other (See Comments)    Caused an upset stomach   Shrimp (Diagnostic) Nausea Only and Other (See Comments)    Upset stomach    Sulfa Antibiotics Hives     Social History   Socioeconomic History   Marital status: Legally Separated    Spouse name: [name]   Number of children: 1   Years of education: some colle   Highest education level: Not on file  Occupational History   Occupation: Employed FT as Warden/ranger    Comment: [employer] , NA  Tobacco Use   Smoking status: Former     Current packs/day: 0.25    Average packs/day: 0.3 packs/day for 0.5 years (0.1 ttl pk-yrs)    Types: Cigarettes    Passive exposure: Never   Smokeless tobacco: Never   Tobacco comments:    Former smoker 11/03/21  Vaping Use   Vaping status: Never Used  Substance and Sexual Activity   Alcohol use: No   Drug use: No   Sexual activity: Not Currently    Birth control/protection: None  Other Topics Concern   Not on file  Social History Narrative   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    Social Determinants of Health   Financial Resource Strain:  Low Risk  (09/28/2021)   Overall Financial Resource Strain (CARDIA)    Difficulty of Paying Living Expenses: Not very hard  Recent Concern: Financial Resource Strain - High Risk (07/11/2021)   Overall Financial Resource Strain (CARDIA)    Difficulty of Paying Living Expenses: Hard  Food Insecurity: No Food Insecurity (09/28/2021)   Hunger Vital Sign    Worried About Running Out of Food in the Last Year: Never true    Ran Out of Food in the Last Year: Never true  Recent Concern: Food Insecurity - Food Insecurity Present (07/11/2021)   Hunger Vital Sign    Worried About Running Out of Food in the Last Year: Sometimes true    Ran Out of Food in the Last Year: Sometimes true  Transportation Needs: No Transportation Needs (09/28/2021)   PRAPARE - Administrator, Civil Service (Medical): No    Lack of Transportation (Non-Medical): No  Physical Activity: Not on file  Stress: Not on file  Social Connections: Not on file  Intimate Partner Violence: Not on file    Physical Exam      Future Appointments  Date Time Provider Department Center  09/26/2023 11:00 AM Victorino Sparrow, MD [facility]

## 2023-09-19 ENCOUNTER — Other Ambulatory Visit (HOSPITAL_COMMUNITY): Payer: Self-pay | Admitting: Emergency Medicine

## 2023-09-19 NOTE — Progress Notes (Signed)
Visit for blood pressure check to follow up from home visit on 09/17/23 (180/90)  Today's pressure is 160/100. She denies chest pain, SOB.  No headaches or visual disturbances.  No other neurological deficits noted.  [name] took her blood pressure medicine this morning at 9:30. Reached out to Dr. Henriette Combs office regarding possible med changes.  LVM for call back from Nancy [name], Charity fundraiser.    Nancy [name], EMT-Paramedic [phone] 09/19/2023

## 2023-09-20 ENCOUNTER — Telehealth: Payer: Self-pay

## 2023-09-20 NOTE — Telephone Encounter (Signed)
VM Message received for Nancy [name]  [facility],  Paramedic on 09/19/2023 . Nancy [name]  reports blood pressure check home visit on 09/17/23 (180/90) Today's pressure is 160/100. Reports patient denies chest pain, SOB. No headaches or visual disturbances. No other neurological deficits noted. Nancy [name] voiced she took her blood pressure medicine at at 9:30.   Called to patient speak with patient. patient's Husband answered , patient is at dialysis. Patient has dialysis M-W-F   Called placed to

## 2023-09-24 ENCOUNTER — Other Ambulatory Visit: Payer: Self-pay

## 2023-09-24 ENCOUNTER — Other Ambulatory Visit (HOSPITAL_COMMUNITY): Payer: Self-pay | Admitting: Emergency Medicine

## 2023-09-24 NOTE — Progress Notes (Unsigned)
Paramedicine Encounter    Patient ID: Nancy [name], female    DOB: 1961/10/18, 62 y.o.   MRN: [id number]   Complaints NONE  Assessment A&O x 4, skin W&D w/ good color.  Lung sounds clear throughout.    Compliance with meds missed Sun. & Mon evening dose  Pill box filled x 1 week  Refills needed NONE  Meds changes since last visit NONE    Social changes NONE   BP (!) 140/0 Comment: palpated  Pulse 83   Resp 16   Wt 228 lb 3.2 oz (103.5 kg)   SpO2 99%   BMI 40.42 kg/m  Weight yesterday-not taken Last visit weight-232lb  Home visit today finds Nancy [name] w/o complaint of chest pain or SOB. She has been compliant with her meds.  She was hypertensive last visit @ 160/100.  Today she is 140/P.  She denies headache.  No weakness or dizziness.   She missed 2 doses of her Renvela.   Today med box reconciled x 1 week without incident.   Food stamp application has been submitted and is still pending. Next home visit 10/01/23 @ 1:15  ACTION: Home visit completed  Bethanie Dicker [phone] 09/24/23  Patient Care Team: Marcine Matar, MD as PCP - General (Internal Medicine) Runell Gess, MD as PCP - Cardiology (Cardiology) Laurey Morale, MD as PCP - Advanced Heart Failure (Cardiology) Center, [facility]  Patient Active Problem List   Diagnosis Date Noted   History of CVA (cerebrovascular accident) 03/22/2023   Community acquired pneumonia 03/22/2023   Acute respiratory failure with hypoxia (HCC) 11/04/2022   Pain, unspecified 08/15/2022   Complication of vascular dialysis catheter 08/07/2022   Chest pain 05/30/2022   Fluid overload 05/30/2022   Acute pulmonary edema (HCC) 05/30/2022   Iron deficiency anemia, unspecified 04/26/2022   Moderate protein-calorie malnutrition (HCC) 04/18/2022   Nausea 04/10/2022   Hyponatremia 04/04/2022   Prolonged QT interval 04/04/2022   Anaphylactic shock, unspecified, initial encounter  02/23/2022   Coagulation defect, unspecified (HCC) 02/23/2022   Other allergy, initial encounter 02/23/2022   Pressure injury of skin 02/19/2022   Anemia in chronic kidney disease 02/16/2022   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) 02/16/2022   Hyperlipidemia, unspecified 02/16/2022   Hypertensive heart and chronic kidney disease with heart failure and with stage 5 chronic kidney disease, or end stage renal disease (HCC) 02/16/2022   Personal history of nicotine dependence 02/16/2022   Personal history of transient ischemic attack (TIA), and cerebral infarction without residual deficits 02/16/2022   Secondary hyperparathyroidism of renal origin (HCC) 02/16/2022   Type 2 diabetes mellitus with diabetic peripheral angiopathy without gangrene (HCC) 02/16/2022   Renal anasarca 02/14/2022   ESRD on dialysis (HCC) 02/14/2022   Hyperkalemia 02/14/2022   S/P arteriovenous (AV) fistula creation 12/22/2021   Critical limb ischemia of both lower extremities (HCC) 11/22/2021   Subacute osteomyelitis, right ankle and foot (HCC)    Peripheral artery disease (HCC) 10/27/2021   Acute exacerbation of congestive heart failure (HCC) 10/24/2021   Acute cardiogenic pulmonary edema (HCC) 10/06/2021   CAD (coronary artery disease) 10/06/2021   Goals of care, counseling/discussion    Gangrene of left foot (HCC)    Cellulitis of left foot    Diabetic foot ulcers (HCC) 09/04/2021   CHF (congestive heart failure) (HCC) 07/07/2021   Acute postoperative anemia due to expected blood loss superimposed on anemia of chronic renal insufficiency 06/30/2021   CKD (chronic kidney disease), stage IV (HCC)  06/11/2021   Chronic diastolic CHF (congestive heart failure) (HCC) 06/10/2021   Abnormal nuclear stress test    Dyspnea on exertion 03/07/2021   Orthopnea 02/25/2021   History of cerebrovascular accident (CVA) with residual deficit 05/06/2020   Diabetic peripheral neuropathy (HCC) 04/26/2020   AKI (acute kidney injury)  (HCC) 04/20/2020   Generalized weakness 04/20/2020   Dehydration 04/20/2020   Generalized abdominal pain    Pancreatitis, acute 02/29/2020   Cerebrovascular accident (CVA) (HCC) 11/08/2019   Stenosis of right carotid artery 11/08/2019   Mixed diabetic hyperlipidemia associated with type 2 diabetes mellitus (HCC) 11/08/2019   Cortical age-related cataract of both eyes 08/30/2019   Gait abnormality 08/30/2019   Statin declined 08/30/2019   Gastroesophageal reflux disease without esophagitis 04/18/2018   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   Insulin dependent type 2 diabetes mellitus (HCC) 10/29/2017   History of macular degeneration 10/29/2017   Chronic cervical pain 10/29/2016   Myalgia 09/14/2016   Insomnia 09/14/2016   Tinea pedis of both feet 08/20/2016   Macular degeneration 08/17/2016   Low back pain 09/21/2014   Hypertensive urgency 09/21/2014   Diabetic gastroparesis associated with type 2 diabetes mellitus (HCC) 09/14/2014   HTN (hypertension) 09/08/2014   Thyroid nodule 08/17/2014   Morbid obesity (HCC) 08/17/2014   Hypokalemia 08/06/2014   Type II diabetes mellitus with neurological manifestations, uncontrolled 08/04/2014    Current Outpatient Medications:    albuterol (VENTOLIN HFA) 108 (90 Base) MCG/ACT inhaler, Inhale 2 puffs into the lungs every 6 (six) hours as needed., Disp: 18 g, Rfl: 3   amLODipine (NORVASC) 10 MG tablet, Take 1 tablet (10 mg total) by mouth daily for hypertension, Disp: 90 tablet, Rfl: 1   aspirin 81 MG EC tablet, Take 1 tablet (81 mg total) by mouth daily., Disp: 60 tablet, Rfl: 1   atorvastatin (LIPITOR) 40 MG tablet, Take 1 tablet (40 mg total) by mouth daily for antilipids, Disp: 90 tablet, Rfl: 1   benzonatate (TESSALON) 100 MG capsule, Take 1 capsule (100 mg total) by mouth 3 (three) times daily as needed for cough., Disp: 21 capsule, Rfl: 0   ezetimibe (ZETIA) 10 MG tablet, Take 1 tablet (10 mg total) by mouth daily for antilipids, Disp: 90 tablet,  Rfl: 1   isosorbide mononitrate (IMDUR) 30 MG 24 hr tablet, Take 2 tablets (60 mg total) by mouth daily for blood pressure, Disp: 180 tablet, Rfl: 1   labetalol (NORMODYNE) 300 MG tablet, Take 1 tablet (300 mg total) by mouth 2 (two) times daily for beta blockers, Disp: 180 tablet, Rfl: 1   levothyroxine (SYNTHROID) 25 MCG tablet, Take 1 tablet (25 mcg total) by mouth daily for low thyroid hormone, Disp: 90 tablet, Rfl: 1   omeprazole (PRILOSEC) 20 MG capsule, Take 2 capsules (40 mg total) by mouth daily for GERD, Disp: 180 capsule, Rfl: 1   ondansetron (ZOFRAN) 8 MG tablet, Take 1 tablet (8 mg total) by mouth every 8 (eight) hours as needed for nausea, Disp: 42 tablet, Rfl: 0   sevelamer carbonate (RENVELA) 800 MG tablet, Take 3 tablets (2,400 mg total) by mouth 3 (three) times daily with meals and 1 tablet with snacks., Disp: 1080 tablet, Rfl: 3   Accu-Chek Softclix Lancets lancets, Use to check blood sugar 3 times daily. (Patient not taking: Reported on 08/28/2023), Disp: 100 each, Rfl: 6   Blood Glucose Monitoring Suppl (ACCU-CHEK GUIDE) w/Device KIT, Use to check blood sugar 3 times daily. (Patient not taking: Reported on 08/28/2023), Disp: 1 kit,  Rfl: 0   Continuous Glucose Receiver (DEXCOM G6 RECEIVER) DEVI, 1 Device by Does not apply route daily. (Patient not taking: Reported on 08/20/2023), Disp: 1 each, Rfl: 0   Continuous Glucose Sensor (DEXCOM G6 SENSOR) MISC, Use as directed daily. (Patient not taking: Reported on 08/28/2023), Disp: 3 each, Rfl: 11   Continuous Glucose Transmitter (DEXCOM G6 TRANSMITTER) MISC, use as directed daily. (Patient not taking: Reported on 08/28/2023), Disp: 1 each, Rfl: 4   glucose blood (ACCU-CHEK GUIDE) test strip, Use to check blood sugar 3 times daily. (Patient not taking: Reported on 08/28/2023), Disp: 100 each, Rfl: 6   Insulin Pen Needle 32G X 4 MM MISC, Use to inject insulin as directed. (Patient not taking: Reported on 09/17/2023), Disp: 100 each, Rfl: 4    Multiple Vitamin (MULTI-VITAMIN DAILY) TABS, Take 1 tablet by mouth at bedtime for supplementation (Patient not taking: Reported on 08/20/2023), Disp: 14 tablet, Rfl: 0   Multiple Vitamin (MULTIVITAMIN) tablet, Take 1 tablet by mouth at bedtime. (Patient not taking: Reported on 08/20/2023), Disp: , Rfl:    polyethylene glycol (MIRALAX / GLYCOLAX) 17 g packet, Give 17 gram by mouth one time a day for constipation (Patient not taking: Reported on 08/20/2023), Disp: 14 packet, Rfl: 0   senna-docusate (STOOL SOFTENER/LAXATIVE) 8.6-50 MG tablet, Take 1 tablet by mouth at bedtime for constipation (Patient not taking: Reported on 08/20/2023), Disp: 14 tablet, Rfl: 0   sevelamer carbonate (RENVELA) 800 MG tablet, Take 2 tablets (1,600 mg total) by mouth 3 (three) times daily. (Patient not taking: Reported on 09/24/2023), Disp: 126 tablet, Rfl: 0   Zinc 220 (50 Zn) MG CAPS, Take 1 capsule by mouth daily for minerals, electrolytes (Patient not taking: Reported on 08/20/2023), Disp: 14 capsule, Rfl: 0 Allergies  Allergen Reactions   Hydralazine Other (See Comments)   Hydralazine Hcl Other (See Comments)    Hair loss   Hydrocodone Itching and Other (See Comments)    Upset stomach   Metformin And Related Nausea And Vomiting and Other (See Comments)    Stomach pains, also   Other Nausea Only and Other (See Comments)    Lettuce- Does not digest this!!   Plaquenil [Hydroxychloroquine Sulfate] Hives   Shellfish Allergy Nausea And Vomiting   Shellfish-Derived Products Nausea Only and Other (See Comments)    Caused an upset stomach   Shrimp (Diagnostic) Nausea Only and Other (See Comments)    Upset stomach    Sulfa Antibiotics Hives     Social History   Socioeconomic History   Marital status: Legally Separated    Spouse name: [name]   Number of children: 1   Years of education: some colle   Highest education level: Not on file  Occupational History   Occupation: Employed FT as Warden/ranger    Comment:  [employer] , NA  Tobacco Use   Smoking status: Former    Current packs/day: 0.25    Average packs/day: 0.3 packs/day for 0.5 years (0.1 ttl pk-yrs)    Types: Cigarettes    Passive exposure: Never   Smokeless tobacco: Never   Tobacco comments:    Former smoker 11/03/21  Vaping Use   Vaping status: Never Used  Substance and Sexual Activity   Alcohol use: No   Drug use: No   Sexual activity: Not Currently    Birth control/protection: None  Other Topics Concern   Not on file  Social History Narrative   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  Social Determinants of Health   Financial Resource Strain: Low Risk  (09/28/2021)   Overall Financial Resource Strain (CARDIA)    Difficulty of Paying Living Expenses: Not very hard  Recent Concern: Financial Resource Strain - High Risk (07/11/2021)   Overall Financial Resource Strain (CARDIA)    Difficulty of Paying Living Expenses: Hard  Food Insecurity: No Food Insecurity (09/28/2021)   Hunger Vital Sign    Worried About Running Out of Food in the Last Year: Never true    Ran Out of Food in the Last Year: Never true  Recent Concern: Food Insecurity - Food Insecurity Present (07/11/2021)   Hunger Vital Sign    Worried About Running Out of Food in the Last Year: Sometimes true    Ran Out of Food in the Last Year: Sometimes true  Transportation Needs: No Transportation Needs (09/28/2021)   PRAPARE - Administrator, Civil Service (Medical): No    Lack of Transportation (Non-Medical): No  Physical Activity: Not on file  Stress: Not on file  Social Connections: Not on file  Intimate Partner Violence: Not on file    Physical Exam      Future Appointments  Date Time Provider Department Center  09/26/2023 11:00 AM Victorino Sparrow, MD [facility]  10/08/2023 11:00 AM [facility] PA/NP [facility] None

## 2023-09-25 NOTE — Progress Notes (Unsigned)
POST OPERATIVE OFFICE NOTE    CC:  F/u for surgery  HPI:  Nancy [name] is a 62 y.o. female with ESRD in need for new HD access.   Prior history includes: Right brachiocephalic fistula which failed to mature Right brachiobasilic fistula which was used prior to thrombosis.  On exam today, Nancy [name] was doing well, accompanied by her husband.  She has had end-stage renal disease for quite some time, and is currently being dialyzed through a left-sided IJ tunneled dialysis catheter.  Nancy [name] had no complaints.  She stated her left-sided [facility] was working well.  She was unsure if she wanted to pursue fistula creation, noting that she really liked her tunneled dialysis catheter understanding it carries a risk of infection.  Prior vein mapping was completed in 2022. She currently dialyzes on MWF.  Allergies  Allergen Reactions   Hydralazine Other (See Comments)   Hydralazine Hcl Other (See Comments)    Hair loss   Hydrocodone Itching and Other (See Comments)    Upset stomach   Metformin And Related Nausea And Vomiting and Other (See Comments)    Stomach pains, also   Other Nausea Only and Other (See Comments)    Lettuce- Does not digest this!!   Plaquenil [Hydroxychloroquine Sulfate] Hives   Shellfish Allergy Nausea And Vomiting   Shellfish-Derived Products Nausea Only and Other (See Comments)    Caused an upset stomach   Shrimp (Diagnostic) Nausea Only and Other (See Comments)    Upset stomach    Sulfa Antibiotics Hives    Current Outpatient Medications  Medication Sig Dispense Refill   Accu-Chek Softclix Lancets lancets Use to check blood sugar 3 times daily. (Patient not taking: Reported on 08/28/2023) 100 each 6   albuterol (VENTOLIN HFA) 108 (90 Base) MCG/ACT inhaler Inhale 2 puffs into the lungs every 6 (six) hours as needed. 18 g 3   amLODipine (NORVASC) 10 MG tablet Take 1 tablet (10 mg total) by mouth daily for hypertension 90 tablet 1   aspirin 81 MG EC tablet Take 1  tablet (81 mg total) by mouth daily. 60 tablet 1   atorvastatin (LIPITOR) 40 MG tablet Take 1 tablet (40 mg total) by mouth daily for antilipids 90 tablet 1   benzonatate (TESSALON) 100 MG capsule Take 1 capsule (100 mg total) by mouth 3 (three) times daily as needed for cough. 21 capsule 0   Blood Glucose Monitoring Suppl (ACCU-CHEK GUIDE) w/Device KIT Use to check blood sugar 3 times daily. (Patient not taking: Reported on 08/28/2023) 1 kit 0   Continuous Glucose Receiver (DEXCOM G6 RECEIVER) DEVI 1 Device by Does not apply route daily. (Patient not taking: Reported on 08/20/2023) 1 each 0   Continuous Glucose Sensor (DEXCOM G6 SENSOR) MISC Use as directed daily. (Patient not taking: Reported on 08/28/2023) 3 each 11   Continuous Glucose Transmitter (DEXCOM G6 TRANSMITTER) MISC use as directed daily. (Patient not taking: Reported on 08/28/2023) 1 each 4   ezetimibe (ZETIA) 10 MG tablet Take 1 tablet (10 mg total) by mouth daily for antilipids 90 tablet 1   glucose blood (ACCU-CHEK GUIDE) test strip Use to check blood sugar 3 times daily. (Patient not taking: Reported on 08/28/2023) 100 each 6   Insulin Pen Needle 32G X 4 MM MISC Use to inject insulin as directed. (Patient not taking: Reported on 09/17/2023) 100 each 4   isosorbide mononitrate (IMDUR) 30 MG 24 hr tablet Take 2 tablets (60 mg total) by mouth daily for blood pressure 180  tablet 1   labetalol (NORMODYNE) 300 MG tablet Take 1 tablet (300 mg total) by mouth 2 (two) times daily for beta blockers 180 tablet 1   levothyroxine (SYNTHROID) 25 MCG tablet Take 1 tablet (25 mcg total) by mouth daily for low thyroid hormone 90 tablet 1   Multiple Vitamin (MULTI-VITAMIN DAILY) TABS Take 1 tablet by mouth at bedtime for supplementation (Patient not taking: Reported on 08/20/2023) 14 tablet 0   Multiple Vitamin (MULTIVITAMIN) tablet Take 1 tablet by mouth at bedtime. (Patient not taking: Reported on 08/20/2023)     omeprazole (PRILOSEC) 20 MG capsule Take 2  capsules (40 mg total) by mouth daily for GERD 180 capsule 1   ondansetron (ZOFRAN) 8 MG tablet Take 1 tablet (8 mg total) by mouth every 8 (eight) hours as needed for nausea 42 tablet 0   polyethylene glycol (MIRALAX / GLYCOLAX) 17 g packet Give 17 gram by mouth one time a day for constipation (Patient not taking: Reported on 08/20/2023) 14 packet 0   senna-docusate (STOOL SOFTENER/LAXATIVE) 8.6-50 MG tablet Take 1 tablet by mouth at bedtime for constipation (Patient not taking: Reported on 08/20/2023) 14 tablet 0   sevelamer carbonate (RENVELA) 800 MG tablet Take 2 tablets (1,600 mg total) by mouth 3 (three) times daily. (Patient not taking: Reported on 09/24/2023) 126 tablet 0   sevelamer carbonate (RENVELA) 800 MG tablet Take 3 tablets (2,400 mg total) by mouth 3 (three) times daily with meals and 1 tablet with snacks. 1080 tablet 3   Zinc 220 (50 Zn) MG CAPS Take 1 capsule by mouth daily for minerals, electrolytes (Patient not taking: Reported on 08/20/2023) 14 capsule 0   No current facility-administered medications for this visit.     ROS:  See HPI  Physical Exam:  Incision:  right upper arm incisions healed well. No signs of infection. Extremities:  right basilic fistula with palpable thrill. Palpable R radial pulse Neuro: intact motor and sensation in RUE Bilateral below-knee amputations  Studies: Dialysis Access Duplex (2022-10-03):  OUTFLOW VEINPSV (cm/s)Diameter (cm)Depth (cm)          Describe              +------------+----------+-------------+----------+-------------------------  ----+  Near axilla    492                           no disease observed;  however                                                   vessel narrows  slightly     +------------+----------+-------------+----------+-------------------------  ----+  Prox UA         67        0.78        0.45                                    +------------+----------+-------------+----------+-------------------------  ----+  Mid UA          97        0.65        0.45                                   +------------+----------+-------------+----------+-------------------------  ----+  Dist UA        398        0.65        0.38                                   +------------+----------+-------------+----------+-------------------------  ----+    Assessment/Plan:  This is a 62 y.o. 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   She presents today for new access.  Most recent vein mapping completed in 2022.  I had a long conversation with Nancy [name] and her husband regarding new HD access.  In the right arm, this to be limited AV graft, however the left arm, there may have usable vein.  Imaging in 2022 demonstrated small basilic vein, however I do think it is worth evaluating the vein with formal vein mapping prior to pursuing AV graft due to Nancy [name]'s young age.  I have ordered formal vein mapping.  I plan to see her in clinic in the coming weeks to discuss the findings and present options.  She made it very clear that she may choose to continue dialysis through her tunneled dialysis catheter, but is interested in understanding all of her options.  Victorino Sparrow MD [facility] [phone] Total time of patient care including pre-visit research, consultation, and documentation greater than 30 minutes

## 2023-09-26 ENCOUNTER — Ambulatory Visit (INDEPENDENT_AMBULATORY_CARE_PROVIDER_SITE_OTHER): Payer: Medicare Other | Admitting: Vascular Surgery

## 2023-09-26 ENCOUNTER — Encounter: Payer: Self-pay | Admitting: Vascular Surgery

## 2023-09-26 VITALS — BP 148/78 | HR 70 | Temp 98.7°F | Ht 63.0 in | Wt 228.0 lb

## 2023-09-26 DIAGNOSIS — N186 End stage renal disease: Secondary | ICD-10-CM | POA: Diagnosis not present

## 2023-09-26 DIAGNOSIS — Z992 Dependence on renal dialysis: Secondary | ICD-10-CM | POA: Diagnosis not present

## 2023-10-01 ENCOUNTER — Other Ambulatory Visit (HOSPITAL_COMMUNITY): Payer: Self-pay | Admitting: Emergency Medicine

## 2023-10-01 NOTE — Progress Notes (Signed)
Paramedicine Encounter    Patient ID: Nancy [name], female    DOB: 09-10-1961, 62 y.o.   MRN: [id number]   Complaints NONE  Assessment A&O x 4, skin W&D w/ good color.  Lung sounds clear and no edema noted.  No chest pain or SOB.  On O2 @ 2lpm via cannula per normal.    Compliance with meds YES  Pill box filled x 1 week  Refills needed NONE  Meds changes since last visit NONE    Social changes NONE   BP 138/80 (BP Location: Left Arm, Patient Position: Sitting, Cuff Size: Normal)   Pulse 78   Resp 16   Wt 230 lb 12.8 oz (104.7 kg)   SpO2 97%   BMI 40.88 kg/m  Weight yesterday- not taken Last visit weight-228lb  ATF Nancy [name] w/o complaint of chest pain or SOB.  She reports that she is feeling well.  She has been compliant with all meds and reports that her dialysis is going well. Reminded pt of pending appointment on 10/08/23  @ [facility].  Advised her I would attend this visit with her and requested she bring her meds and pill box with her and I will reconcile same at that time.   ACTION: Home visit completed  Bethanie Dicker [phone] 10/01/23  Patient Care Team: Marcine Matar, MD as PCP - General (Internal Medicine) Runell Gess, MD as PCP - Cardiology (Cardiology) Laurey Morale, MD as PCP - Advanced Heart Failure (Cardiology) Center, [facility]  Patient Active Problem List   Diagnosis Date Noted   History of CVA (cerebrovascular accident) 03/22/2023   Community acquired pneumonia 03/22/2023   Acute respiratory failure with hypoxia (HCC) 11/04/2022   Pain, unspecified 08/15/2022   Complication of vascular dialysis catheter 08/07/2022   Chest pain 05/30/2022   Fluid overload 05/30/2022   Acute pulmonary edema (HCC) 05/30/2022   Iron deficiency anemia, unspecified 04/26/2022   Moderate protein-calorie malnutrition (HCC) 04/18/2022   Nausea 04/10/2022   Hyponatremia 04/04/2022   Prolonged QT interval 04/04/2022    Anaphylactic shock, unspecified, initial encounter 02/23/2022   Coagulation defect, unspecified (HCC) 02/23/2022   Other allergy, initial encounter 02/23/2022   Pressure injury of skin 02/19/2022   Anemia in chronic kidney disease 02/16/2022   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) 02/16/2022   Hyperlipidemia, unspecified 02/16/2022   Hypertensive heart and chronic kidney disease with heart failure and with stage 5 chronic kidney disease, or end stage renal disease (HCC) 02/16/2022   Personal history of nicotine dependence 02/16/2022   Personal history of transient ischemic attack (TIA), and cerebral infarction without residual deficits 02/16/2022   Secondary hyperparathyroidism of renal origin (HCC) 02/16/2022   Type 2 diabetes mellitus with diabetic peripheral angiopathy without gangrene (HCC) 02/16/2022   Renal anasarca 02/14/2022   ESRD on dialysis (HCC) 02/14/2022   Hyperkalemia 02/14/2022   S/P arteriovenous (AV) fistula creation 12/22/2021   Critical limb ischemia of both lower extremities (HCC) 11/22/2021   Subacute osteomyelitis, right ankle and foot (HCC)    Peripheral artery disease (HCC) 10/27/2021   Acute exacerbation of congestive heart failure (HCC) 10/24/2021   Acute cardiogenic pulmonary edema (HCC) 10/06/2021   CAD (coronary artery disease) 10/06/2021   Goals of care, counseling/discussion    Gangrene of left foot (HCC)    Cellulitis of left foot    Diabetic foot ulcers (HCC) 09/04/2021   CHF (congestive heart failure) (HCC) 07/07/2021   Acute postoperative anemia due to expected blood loss  superimposed on anemia of chronic renal insufficiency 06/30/2021   CKD (chronic kidney disease), stage IV (HCC) 06/11/2021   Chronic diastolic CHF (congestive heart failure) (HCC) 06/10/2021   Abnormal nuclear stress test    Dyspnea on exertion 03/07/2021   Orthopnea 02/25/2021   History of cerebrovascular accident (CVA) with residual deficit 05/06/2020   Diabetic peripheral  neuropathy (HCC) 04/26/2020   AKI (acute kidney injury) (HCC) 04/20/2020   Generalized weakness 04/20/2020   Dehydration 04/20/2020   Generalized abdominal pain    Pancreatitis, acute 02/29/2020   Cerebrovascular accident (CVA) (HCC) 11/08/2019   Stenosis of right carotid artery 11/08/2019   Mixed diabetic hyperlipidemia associated with type 2 diabetes mellitus (HCC) 11/08/2019   Cortical age-related cataract of both eyes 08/30/2019   Gait abnormality 08/30/2019   Statin declined 08/30/2019   Gastroesophageal reflux disease without esophagitis 04/18/2018   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   Insulin dependent type 2 diabetes mellitus (HCC) 10/29/2017   History of macular degeneration 10/29/2017   Chronic cervical pain 10/29/2016   Myalgia 09/14/2016   Insomnia 09/14/2016   Tinea pedis of both feet 08/20/2016   Macular degeneration 08/17/2016   Low back pain 09/21/2014   Hypertensive urgency 09/21/2014   Diabetic gastroparesis associated with type 2 diabetes mellitus (HCC) 09/14/2014   HTN (hypertension) 09/08/2014   Thyroid nodule 08/17/2014   Morbid obesity (HCC) 08/17/2014   Hypokalemia 08/06/2014   Type II diabetes mellitus with neurological manifestations, uncontrolled 08/04/2014    Current Outpatient Medications:    albuterol (VENTOLIN HFA) 108 (90 Base) MCG/ACT inhaler, Inhale 2 puffs into the lungs every 6 (six) hours as needed., Disp: 18 g, Rfl: 3   amLODipine (NORVASC) 10 MG tablet, Take 1 tablet (10 mg total) by mouth daily for hypertension, Disp: 90 tablet, Rfl: 1   aspirin 81 MG EC tablet, Take 1 tablet (81 mg total) by mouth daily., Disp: 60 tablet, Rfl: 1   atorvastatin (LIPITOR) 40 MG tablet, Take 1 tablet (40 mg total) by mouth daily for antilipids, Disp: 90 tablet, Rfl: 1   benzonatate (TESSALON) 100 MG capsule, Take 1 capsule (100 mg total) by mouth 3 (three) times daily as needed for cough., Disp: 21 capsule, Rfl: 0   ezetimibe (ZETIA) 10 MG tablet, Take 1 tablet (10 mg  total) by mouth daily for antilipids, Disp: 90 tablet, Rfl: 1   isosorbide mononitrate (IMDUR) 30 MG 24 hr tablet, Take 2 tablets (60 mg total) by mouth daily for blood pressure, Disp: 180 tablet, Rfl: 1   labetalol (NORMODYNE) 300 MG tablet, Take 1 tablet (300 mg total) by mouth 2 (two) times daily for beta blockers, Disp: 180 tablet, Rfl: 1   levothyroxine (SYNTHROID) 25 MCG tablet, Take 1 tablet (25 mcg total) by mouth daily for low thyroid hormone, Disp: 90 tablet, Rfl: 1   omeprazole (PRILOSEC) 20 MG capsule, Take 2 capsules (40 mg total) by mouth daily for GERD, Disp: 180 capsule, Rfl: 1   ondansetron (ZOFRAN) 8 MG tablet, Take 1 tablet (8 mg total) by mouth every 8 (eight) hours as needed for nausea, Disp: 42 tablet, Rfl: 0   polyethylene glycol (MIRALAX / GLYCOLAX) 17 g packet, Give 17 gram by mouth one time a day for constipation, Disp: 14 packet, Rfl: 0   sevelamer carbonate (RENVELA) 800 MG tablet, Take 3 tablets (2,400 mg total) by mouth 3 (three) times daily with meals and 1 tablet with snacks., Disp: 1080 tablet, Rfl: 3   Accu-Chek Softclix Lancets lancets, Use to check  blood sugar 3 times daily. (Patient not taking: Reported on 10/01/2023), Disp: 100 each, Rfl: 6   Blood Glucose Monitoring Suppl (ACCU-CHEK GUIDE) w/Device KIT, Use to check blood sugar 3 times daily. (Patient not taking: Reported on 10/01/2023), Disp: 1 kit, Rfl: 0   Continuous Glucose Receiver (DEXCOM G6 RECEIVER) DEVI, 1 Device by Does not apply route daily. (Patient not taking: Reported on 10/01/2023), Disp: 1 each, Rfl: 0   Continuous Glucose Sensor (DEXCOM G6 SENSOR) MISC, Use as directed daily. (Patient not taking: Reported on 10/01/2023), Disp: 3 each, Rfl: 11   Continuous Glucose Transmitter (DEXCOM G6 TRANSMITTER) MISC, use as directed daily. (Patient not taking: Reported on 10/01/2023), Disp: 1 each, Rfl: 4   glucose blood (ACCU-CHEK GUIDE) test strip, Use to check blood sugar 3 times daily. (Patient not taking:  Reported on 10/01/2023), Disp: 100 each, Rfl: 6   Insulin Pen Needle 32G X 4 MM MISC, Use to inject insulin as directed. (Patient not taking: Reported on 10/01/2023), Disp: 100 each, Rfl: 4   Multiple Vitamin (MULTI-VITAMIN DAILY) TABS, Take 1 tablet by mouth at bedtime for supplementation (Patient not taking: Reported on 10/01/2023), Disp: 14 tablet, Rfl: 0   Multiple Vitamin (MULTIVITAMIN) tablet, Take 1 tablet by mouth at bedtime. (Patient not taking: Reported on 10/01/2023), Disp: , Rfl:    senna-docusate (STOOL SOFTENER/LAXATIVE) 8.6-50 MG tablet, Take 1 tablet by mouth at bedtime for constipation (Patient not taking: Reported on 10/01/2023), Disp: 14 tablet, Rfl: 0   sevelamer carbonate (RENVELA) 800 MG tablet, Take 2 tablets (1,600 mg total) by mouth 3 (three) times daily. (Patient not taking: Reported on 10/01/2023), Disp: 126 tablet, Rfl: 0   Zinc 220 (50 Zn) MG CAPS, Take 1 capsule by mouth daily for minerals, electrolytes (Patient not taking: Reported on 10/01/2023), Disp: 14 capsule, Rfl: 0 Allergies  Allergen Reactions   Hydralazine Other (See Comments)   Hydralazine Hcl Other (See Comments)    Hair loss   Hydrocodone Itching and Other (See Comments)    Upset stomach   Metformin And Related Nausea And Vomiting and Other (See Comments)    Stomach pains, also   Other Nausea Only and Other (See Comments)    Lettuce- Does not digest this!!   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    Comment: [employer] , NA  Tobacco Use   Smoking status: Former    Current packs/day: 0.25    Average packs/day: 0.3 packs/day for 0.5 years (0.1 ttl pk-yrs)    Types: Cigarettes    Passive exposure: Never   Smokeless tobacco: Never   Tobacco comments:    Former smoker 11/03/21  Vaping Use   Vaping status: Never Used  Substance and Sexual Activity   Alcohol use: No   Drug use: No   Sexual activity: Not Currently    Birth control/protection: None  Other Topics Concern   Not on file  Social History Narrative  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    Social Determinants of Health   Financial Resource Strain: Low Risk  (09/28/2021)   Overall Financial Resource Strain (CARDIA)    Difficulty of Paying Living Expenses: Not very hard  Recent Concern: Financial Resource Strain - High Risk (07/11/2021)   Overall Financial Resource Strain (CARDIA)    Difficulty of Paying Living Expenses: Hard  Food Insecurity: No Food Insecurity (09/28/2021)   Hunger Vital Sign    Worried About Running Out of Food in the Last Year: Never true    Ran Out of Food in the Last Year: Never true  Recent Concern: Food Insecurity - Food Insecurity Present (07/11/2021)   Hunger Vital Sign    Worried About Running Out of Food in the Last Year: Sometimes true    Ran Out of Food in the Last Year: Sometimes true  Transportation Needs: No Transportation Needs (09/28/2021)   PRAPARE - Administrator, Civil Service (Medical): No    Lack of Transportation (Non-Medical): No  Physical Activity: Not on file  Stress: Not on file  Social Connections: Not on file  Intimate Partner Violence: Not on file    Physical Exam      Future Appointments  Date Time Provider Department Center  10/08/2023 11:00 AM [facility] PA/NP [facility] None  10/31/2023  3:00 PM [facility]  10/31/2023  4:00 PM Victorino Sparrow, MD [facility]

## 2023-10-02 ENCOUNTER — Other Ambulatory Visit (HOSPITAL_COMMUNITY): Payer: Self-pay

## 2023-10-02 MED ORDER — ONDANSETRON HCL 8 MG PO TABS
8.0000 mg | ORAL_TABLET | Freq: Three times a day (TID) | ORAL | 1 refills | Status: DC | PRN
  Filled 2023-10-02: qty 87, 29d supply, fill #0
  Filled 2023-10-02: qty 30, 10d supply, fill #0
  Filled 2023-10-02: qty 60, 20d supply, fill #0

## 2023-10-07 NOTE — Progress Notes (Signed)
Advanced Heart Failure Clinic Note   PCP: Marcine Matar, MD PCP-Cardiologist: Nanetta Batty, MD  HF Cardiologist: Dr. Shirlee Latch  HPI: Nancy [name] 62 y.o. AAF w/ chronic diastolic heart failure, Stage IV CKD (baseline SCr ~2-3), HTN, T2DM, HLD, CVA, PVD, carotid stenosis, and tobacco abuse.   Echo 11/2019 showed EF 55-60%, RV normal. No LVH. No significant valvular dysfunction   She was referred to Dr. Allyson Sabal for [facility] by PCP in April 2022. Complained of progressive exertional dyspnea, LEE and orthopnea. Echo 4/22 with EF 60-65%, GIIDD, mild LVH, RV normal. Cardiolite showed findings suggestive of possible anterior ischemia vs breast attenuation. R/LHC on 04/27/21 showed mild nonobstructive CAD and RHC hemodynamics c/w pulmonary HTN. Diuretics were increased w/ plans to refer to the [facility]. Dry wt felt to be ~228 lb.   Admitted 5/24-5/27/22=> discharged home on Lasix 40 daily.   Admitted 6/25-6/30/22 =>SCr bump to 3.2 (2.9 at d/c). Discharged home on Torsemide 40 daily. Echo EF 55-60%, RV normal.   Admitted 07/07/21= > direct admit from Dr. Hazle Coca office w/ marked fluid overload, progressive wt gain, exertional dyspnea, LEE and orthopnea. Clinic BP elevated 172/85. SCr 2.6 on admit. Started on lasix gtt. AHF team consulted to assist with further management. She underwent RHC, suggested ongoing volume overload with elevated PCWP and RA.  Prominent v-waves in PCWP but think due to severe diastolic dysfunction as there was no significant MR on review of echo.  PYP study in 7/22 was not suggestive of TTR cardiac amyloidosis.  Suspect severe diastolic dysfunction due to long-standing and poorly controlled HTN. SCr and potassium continued to climb and nephrology consulted. Her diuretics were adjusted and she was able to be discharged on low dose carvedilol and torsemide 40 daily; weight 226 lbs.  Admitted 09/04/21 with a left heel ulcer secondary to diabetic neuropathy.  Work-up included right ABI  of 0.45 and left ABI of 0.5.  She was started on antibiotics, no osteomyelitis on MRI. Vascular consulted and did not pursue angiography as it could potentially push her into renal failure and require initiation of HD. Ortho recommended left transtibial amputation  but she declined. Palliative care met with her but she did not want to discuss her health issues. There was no indication for HD, per nephrology, but AV fistula was recommended. She was not interested in this. PT recommended SNF however patient declined. She was discharged with Home Health.   Admitted 10/22 w/ acute on chronic HFpEF exacerbation w/ acute pulmonary edema in setting of hypertensive urgency. Acute exacerbation felt triggered by poor med compliance and dietary indiscretion w/ sodium + high fluid intake. Echo in 10/22 showed EF 50-55%, mod LVH, G2DD. Course c/b worsening CKD. SCr bumped to 3.12 (prior baseline ~ 2.5). Nephrology was consulted but felt no emergent indication for HD. She responded well to IV Lasix, though nephrology anticipated that she may be nearing need for HD soon. Plan is to follow in the outpatient setting. Also noted to have gangrenous pressure ulcers of bilateral heels left and right dorsal feet. Now followed by wound care. Discharged home on torsemide 60 mg daily. D/c SCr 3.12. D/c wt 231 lb.   Admitted 11/22 with A/C CHF exacerbation and left foot osteomyelitis. She was diuresed with IV lasix and transitioned to po. GDMT limited by CKD IV. Ortho surgery again recommended amputation, she continued to decline. She was also noted to have wound on right metatarsal, XR showed no osteomyelitis. She received bedside debridement and wound care to left foot.  She was discharged home with plans for on-going wound care, weight 245 lbs.  Received Lasix 80 mg IV + metolazone 2.5 at home for volume overload 12/13/21 via Paramedicine. No improvement in symptoms and she was advised to go to ED. Received further IV Lasix and felt  better. Discharged home.   Admitted 12/22/21 for respiratory distress requiring intubation after her AVF creation surgery.  Peripheral arterial dopplers in 1/23 with moderate right and severe left SFA stenosis.    Today she returns for HF follow up with her husband. She is able to get around her house without marked dyspnea, but does not do much more than this. She continues with daily dressing changes to LLE wound and wears a boot. Dr. Allyson Sabal has seen, is considering intervention on left leg (see peripheral arterial dopplers above).  Continues with significant LE edema. Denies CP, dizziness,or PND/Orthopnea. Appetite ok. No fever or chills. Weight at home better at 234 lbs. Taking all medications. Followed by Paramedicine Herbert Seta).  ECG (personally reviewed): none ordered today.  Labs (1/23): K 3.6, creatinine 4.27 Labs (2/23): K 4.3, creatinine 5.31  PMH:  1. CKD stage IV 2. Chronic diastolic CHF: PYP scan (7/22).  - RHC (7/22): mean RA 13, PA 68/22 mean 42, mean PCWP 25, CI 2.65, PVR 3.1. - Echo (10/22): EF 50-55%, moderate LVH, normal RV size and systolic function, normal IVC.  3. HTN 4. Peripheral arterial disease:  - Peripheral arterial dopplers (1/23): right 50-74% distal SFA, left 75-99% SFA, bilateral tibial disease.  5. Carotid stenosis: Dopplers (5/22) with totally occluded RICA, 40-59% LICA.  6. Former smoker.  7. Type 2 diabetes 8. CAD: LHC (5/22) with 50% mLAD.  9. CVA: 2020.  10. Hyperlipidemia 11. Depression  ROS: All systems reviewed and negative except as per HPI.   Current Outpatient Medications  Medication Sig Dispense Refill   Accu-Chek Softclix Lancets lancets Use to check blood sugar 3 times daily. (Patient not taking: Reported on 10/01/2023) 100 each 6   albuterol (VENTOLIN HFA) 108 (90 Base) MCG/ACT inhaler Inhale 2 puffs into the lungs every 6 (six) hours as needed. 18 g 3   amLODipine (NORVASC) 10 MG tablet Take 1 tablet (10 mg total) by mouth daily for  hypertension 90 tablet 1   aspirin 81 MG EC tablet Take 1 tablet (81 mg total) by mouth daily. 60 tablet 1   atorvastatin (LIPITOR) 40 MG tablet Take 1 tablet (40 mg total) by mouth daily for antilipids 90 tablet 1   benzonatate (TESSALON) 100 MG capsule Take 1 capsule (100 mg total) by mouth 3 (three) times daily as needed for cough. 21 capsule 0   Blood Glucose Monitoring Suppl (ACCU-CHEK GUIDE) w/Device KIT Use to check blood sugar 3 times daily. (Patient not taking: Reported on 10/01/2023) 1 kit 0   Continuous Glucose Receiver (DEXCOM G6 RECEIVER) DEVI 1 Device by Does not apply route daily. (Patient not taking: Reported on 10/01/2023) 1 each 0   Continuous Glucose Sensor (DEXCOM G6 SENSOR) MISC Use as directed daily. (Patient not taking: Reported on 10/01/2023) 3 each 11   Continuous Glucose Transmitter (DEXCOM G6 TRANSMITTER) MISC use as directed daily. (Patient not taking: Reported on 10/01/2023) 1 each 4   ezetimibe (ZETIA) 10 MG tablet Take 1 tablet (10 mg total) by mouth daily for antilipids 90 tablet 1   glucose blood (ACCU-CHEK GUIDE) test strip Use to check blood sugar 3 times daily. (Patient not taking: Reported on 10/01/2023) 100 each 6   Insulin  Pen Needle 32G X 4 MM MISC Use to inject insulin as directed. (Patient not taking: Reported on 10/01/2023) 100 each 4   isosorbide mononitrate (IMDUR) 30 MG 24 hr tablet Take 2 tablets (60 mg total) by mouth daily for blood pressure 180 tablet 1   labetalol (NORMODYNE) 300 MG tablet Take 1 tablet (300 mg total) by mouth 2 (two) times daily for beta blockers 180 tablet 1   levothyroxine (SYNTHROID) 25 MCG tablet Take 1 tablet (25 mcg total) by mouth daily for low thyroid hormone 90 tablet 1   Multiple Vitamin (MULTI-VITAMIN DAILY) TABS Take 1 tablet by mouth at bedtime for supplementation (Patient not taking: Reported on 10/01/2023) 14 tablet 0   Multiple Vitamin (MULTIVITAMIN) tablet Take 1 tablet by mouth at bedtime. (Patient not taking:  Reported on 10/01/2023)     omeprazole (PRILOSEC) 20 MG capsule Take 2 capsules (40 mg total) by mouth daily for GERD 180 capsule 1   ondansetron (ZOFRAN) 8 MG tablet Take 1 tablet (8 mg total) by mouth every 8 (eight) hours as needed for nausea 42 tablet 0   ondansetron (ZOFRAN) 8 MG tablet Take 1 tablet (8 mg total) by mouth every 8 (eight) hours as needed for nausea 90 tablet 1   polyethylene glycol (MIRALAX / GLYCOLAX) 17 g packet Give 17 gram by mouth one time a day for constipation 14 packet 0   senna-docusate (STOOL SOFTENER/LAXATIVE) 8.6-50 MG tablet Take 1 tablet by mouth at bedtime for constipation (Patient not taking: Reported on 10/01/2023) 14 tablet 0   sevelamer carbonate (RENVELA) 800 MG tablet Take 2 tablets (1,600 mg total) by mouth 3 (three) times daily. (Patient not taking: Reported on 10/01/2023) 126 tablet 0   sevelamer carbonate (RENVELA) 800 MG tablet Take 3 tablets (2,400 mg total) by mouth 3 (three) times daily with meals and 1 tablet with snacks. 1080 tablet 3   Zinc 220 (50 Zn) MG CAPS Take 1 capsule by mouth daily for minerals, electrolytes (Patient not taking: Reported on 10/01/2023) 14 capsule 0   No current facility-administered medications for this visit.   Allergies  Allergen Reactions   Hydralazine Other (See Comments)   Hydralazine Hcl Other (See Comments)    Hair loss   Hydrocodone Itching and Other (See Comments)    Upset stomach   Metformin And Related Nausea And Vomiting and Other (See Comments)    Stomach pains, also   Other Nausea Only and Other (See Comments)    Lettuce- Does not digest this!!   Plaquenil [Hydroxychloroquine Sulfate] Hives   Shellfish Allergy Nausea And Vomiting   Shellfish-Derived Products Nausea Only and Other (See Comments)    Caused an upset stomach   Shrimp (Diagnostic) Nausea Only and Other (See Comments)    Upset stomach    Sulfa Antibiotics Hives   Social History   Socioeconomic History   Marital status: Legally  Separated    Spouse name: [name]   Number of children: 1   Years of education: some colle   Highest education level: Not on file  Occupational History   Occupation: Employed FT as Warden/ranger    Comment: [employer] , NA  Tobacco Use   Smoking status: Former    Current packs/day: 0.25    Average packs/day: 0.3 packs/day for 0.5 years (0.1 ttl pk-yrs)    Types: Cigarettes    Passive exposure: Never   Smokeless tobacco: Never   Tobacco comments:    Former smoker 11/03/21  Vaping Use  Vaping status: Never Used  Substance and Sexual Activity   Alcohol use: No   Drug use: No   Sexual activity: Not Currently    Birth control/protection: None  Other Topics Concern   Not on file  Social History Narrative   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    Social Determinants of Health   Financial Resource Strain: Low Risk  (09/28/2021)   Overall Financial Resource Strain (CARDIA)    Difficulty of Paying Living Expenses: Not very hard  Recent Concern: Physicist, medical Strain - High Risk (07/11/2021)   Overall Financial Resource Strain (CARDIA)    Difficulty of Paying Living Expenses: Hard  Food Insecurity: No Food Insecurity (09/28/2021)   Hunger Vital Sign    Worried About Running Out of Food in the Last Year: Never true    Ran Out of Food in the Last Year: Never true  Recent Concern: Food Insecurity - Food Insecurity Present (07/11/2021)   Hunger Vital Sign    Worried About Running Out of Food in the Last Year: Sometimes true    Ran Out of Food in the Last Year: Sometimes true  Transportation Needs: No Transportation Needs (09/28/2021)   PRAPARE - Administrator, Civil Service (Medical): No    Lack of Transportation (Non-Medical): No  Physical Activity: Not on file  Stress: Not on file  Social Connections: Not on file  Intimate Partner Violence: Not on file   Family History  Problem Relation Age of Onset   Hypertension Mother     Heart disease Mother    Diabetes Mother    Thyroid disease Mother    Congestive Heart Failure Mother    Breast cancer Maternal Grandmother    Colon cancer Maternal Grandfather    Heart attack Sister    Heart disease Brother    Hyperlipidemia Brother    Hypertension Brother    Diabetes Father    Breast cancer Maternal Aunt    There were no vitals taken for this visit.  Wt Readings from Last 3 Encounters:  10/01/23 104.7 kg (230 lb 12.8 oz)  09/26/23 103.4 kg (228 lb)  09/24/23 103.5 kg (228 lb 3.2 oz)   General:  NAD. No resp difficulty HEENT: Normal Neck: Supple. JVP 10-12. Carotids 2+ bilat; no bruits. No lymphadenopathy or thryomegaly appreciated. Cor: PMI nondisplaced. Regular rate & rhythm. No rubs, gallops or murmurs. Lungs: Clear Abdomen: Obese, nontender, nondistended. No hepatosplenomegaly. No bruits or masses. Good bowel sounds. Extremities: No cyanosis, clubbing, rash, 2-3+ BLE edema to knees; left foot in ortho boot. Neuro: Alert & oriented x 3, cranial nerves grossly intact. Moves all 4 extremities w/o difficulty. Flat pleasant.  ASSESSMENT & PLAN: 1. Acute on chronic diastolic CHF: Echo in 6/22 showed EF 55-60%, moderate LVH, normal RV, dilated IVC.  R/LHC in 5/22 showed nonobstructive CAD, pulmonary hypertension .  Multiple admissions recently with CHF, complicated by CKD stage IV.  RHC in 7/22 suggested ongoing volume overload with elevated PCWP and RA.  Prominent v-waves in PCWP but think due to severe diastolic dysfunction as there is no significant MR on review of recent echo.  PYP study in 7/22 was not suggestive of TTR cardiac amyloidosis.  Suspect severe diastolic dysfunction due to long-standing and poorly controlled HTN.  Admit for diastolic CHF 10/22 & 11/22 w/ fluid overload, c/b CKD. NYHA 3-4, functional class difficult due to LLE wound and deconditioning. She is volume overloaded today.  Management limited by CKD  IV.  Suspect she is nearing ESRD.  - Continue  torsemide to 100 mg bid.  - Increase metolazone to three times weekly, on Monday, Wednesday and Friday. She will take KCl 40 with each metolazone dose. BMET/BNP today, repeat BMET in 10 days. - Continue with paramedicine. 2. CAD: LHC on 5/22, she has mild to moderate segmental proximal mid and distal LAD disease. Her circumflex and RCA did not have significant disease.  No chest pain.  - Continue ASA - Continue atorvastatin 40 mg daily, LDL102 (1/23). 3. CKD stage IV: Renal US with no evidence of obstruction. Followed by nephrology. Now s/p AVF creation. She sees Dr. Signe Colt 02/22/22. Likely nearing HD soon. 4. PAD: Now with bilateral gangrenous pressure ulcers, L >R. Evaluated by vascular and orthopedic surgery during recent admissions, recommend transtibial amputation however patient declined this, opting for non-surgical healing.  Peripheral arterial dopplers in 1/23 with moderate SFA stenosis on right and severe on left, she has bilateral tibial disease.  - To followup with Dr. Allyson Sabal to evaluate for peripheral intervention.  Concern about giving her contrast load with advanced CKD, but she is likely to progress to HD soon regardless of contrast bolus.  - [facility] wounds [facility] per patient and husband. - Continue aspirin + statin. - Continue offloading heels, has boot on left foot.  5. HLD: LDL 102 1/23. 6. HTN: Controlled. - Continue carvedilol 18.75 mg bid. - Continue amlodipine 10 mg daily. 7. Carotid disease: Occluded R ICA, moderate L ICA. - Continue ASA, statin.  Follow up in 4 weeks with APP and 12 weeks with Dr. Kathreen Cornfield [facility] FNP 10/07/2023 3:30 PM]

## 2023-10-08 ENCOUNTER — Ambulatory Visit (HOSPITAL_COMMUNITY)
Admission: RE | Admit: 2023-10-08 | Discharge: 2023-10-08 | Disposition: A | Discharge status: Home / Self Care | Payer: Medicare Other | Source: Ambulatory Visit | Attending: Family Medicine | Admitting: Family Medicine

## 2023-10-08 ENCOUNTER — Other Ambulatory Visit (HOSPITAL_COMMUNITY): Payer: Self-pay | Admitting: Emergency Medicine

## 2023-10-08 ENCOUNTER — Encounter (HOSPITAL_COMMUNITY): Payer: Self-pay

## 2023-10-08 ENCOUNTER — Other Ambulatory Visit (HOSPITAL_COMMUNITY): Payer: Self-pay

## 2023-10-08 VITALS — BP 162/84 | HR 83 | Wt 246.4 lb

## 2023-10-08 DIAGNOSIS — Z992 Dependence on renal dialysis: Secondary | ICD-10-CM | POA: Insufficient documentation

## 2023-10-08 DIAGNOSIS — E1122 Type 2 diabetes mellitus with diabetic chronic kidney disease: Secondary | ICD-10-CM | POA: Insufficient documentation

## 2023-10-08 DIAGNOSIS — Z89512 Acquired absence of left leg below knee: Secondary | ICD-10-CM | POA: Insufficient documentation

## 2023-10-08 DIAGNOSIS — I5032 Chronic diastolic (congestive) heart failure: Secondary | ICD-10-CM | POA: Diagnosis present

## 2023-10-08 DIAGNOSIS — E1151 Type 2 diabetes mellitus with diabetic peripheral angiopathy without gangrene: Secondary | ICD-10-CM | POA: Diagnosis not present

## 2023-10-08 DIAGNOSIS — I2510 Atherosclerotic heart disease of native coronary artery without angina pectoris: Secondary | ICD-10-CM | POA: Insufficient documentation

## 2023-10-08 DIAGNOSIS — Z8673 Personal history of transient ischemic attack (TIA), and cerebral infarction without residual deficits: Secondary | ICD-10-CM | POA: Insufficient documentation

## 2023-10-08 DIAGNOSIS — Z87891 Personal history of nicotine dependence: Secondary | ICD-10-CM | POA: Insufficient documentation

## 2023-10-08 DIAGNOSIS — I6523 Occlusion and stenosis of bilateral carotid arteries: Secondary | ICD-10-CM | POA: Insufficient documentation

## 2023-10-08 DIAGNOSIS — N186 End stage renal disease: Secondary | ICD-10-CM | POA: Insufficient documentation

## 2023-10-08 DIAGNOSIS — Z89511 Acquired absence of right leg below knee: Secondary | ICD-10-CM | POA: Diagnosis not present

## 2023-10-08 DIAGNOSIS — I10 Essential (primary) hypertension: Secondary | ICD-10-CM

## 2023-10-08 DIAGNOSIS — E785 Hyperlipidemia, unspecified: Secondary | ICD-10-CM | POA: Diagnosis not present

## 2023-10-08 DIAGNOSIS — I130 Hypertensive heart and chronic kidney disease with heart failure and stage 1 through stage 4 chronic kidney disease, or unspecified chronic kidney disease: Secondary | ICD-10-CM | POA: Diagnosis not present

## 2023-10-08 DIAGNOSIS — I739 Peripheral vascular disease, unspecified: Secondary | ICD-10-CM | POA: Diagnosis not present

## 2023-10-08 DIAGNOSIS — N184 Chronic kidney disease, stage 4 (severe): Secondary | ICD-10-CM | POA: Diagnosis present

## 2023-10-08 DIAGNOSIS — E782 Mixed hyperlipidemia: Secondary | ICD-10-CM

## 2023-10-08 LAB — LIPID PANEL
Cholesterol: 110 mg/dL (ref 0–200)
HDL: 45 mg/dL (ref >40)
LDL Cholesterol: 46 mg/dL (ref 0–99)
Total CHOL/HDL Ratio: 2.4 {ratio}
Triglycerides: 95 mg/dL (ref <150)
VLDL: 19 mg/dL (ref 0–40)

## 2023-10-08 NOTE — Patient Instructions (Addendum)
Thank you for coming in today  If you had labs drawn today, any labs that are abnormal the clinic will call you No news is good news  Medications: No changes  Follow up appointments:  Your physician recommends that you schedule a follow-up appointment in:  6 months With Dr. Earlean Shawl will receive a reminder letter in the mail a few months in advance. If you don't receive a letter, please call our office to schedule the follow-up appointment.    Do the following things EVERYDAY: Weigh yourself in the morning before breakfast. Write it down and keep it in a log. Take your medicines as prescribed Eat low salt foods--Limit salt (sodium) to 2000 mg per day.  Stay as active as you can everyday Limit all fluids for the day to less than 2 liters   At the [facility], you and your health needs are our priority. As part of our continuing mission to provide you with exceptional heart care, we have created designated Provider Care Teams. These Care Teams include your primary Cardiologist (physician) and Advanced Practice Providers (APPs- Physician Assistants and Nurse Practitioners) who all work together to provide you with the care you need, when you need it.   You may see any of the following providers on your designated Care Team at your next follow up: Dr Arvilla Meres Dr Marca Ancona Dr. Marcos Eke, NP Robbie Lis, [place] [facility] [place], [place] Brynda Peon, NP [name], PharmD   Please be sure to bring in all your medications bottles to every appointment.    Thank you for choosing [facility]  If you have any questions or concerns before your next appointment please send [place] a message through [place] or call our office at [phone].    TO LEAVE A MESSAGE FOR THE NURSE SELECT OPTION 2, PLEASE LEAVE A MESSAGE INCLUDING: YOUR NAME DATE OF BIRTH CALL BACK NUMBER REASON FOR  CALL**this is important as we prioritize the call backs  YOU WILL RECEIVE A CALL BACK THE SAME DAY AS LONG AS YOU CALL BEFORE 4:00 PM

## 2023-10-08 NOTE — Progress Notes (Signed)
Paramedicine Encounter   Patient ID: Nancy [name] , female,   DOB: [birth year],62 y.o.,  MRN: [id number]   Met patient in clinic today with provider.   AOZHYQ@ clinic-246lb  (-4.8lb for prosthetics 241.2lb) B/P-162/84 P-83 [id number]%   Med changes today (if any) : No med changes  Attended clinic visit with Mr & Nancy [name].  Per Provider [facility] pt is looking really good and she is happy with her current presentation.  Mr. Chouinard goes above and beyond for his wife to make sure she has what she needs and is an excellent caregiver. Pt. 2 pm doses of meds.   No med changes today and pill box reconciled x 1 week without incident. Mr. Kobrin inquired about moving his wife's dialysis to another location.  He states he really likes the dialysis facility at the hospital but no sure that it is possible to get her Mon/Wed/Friday treatment there?  Shanda Bumps recommended pt reach out to her nephrologist to inquire about same.  Also, pt has questions about pre-requisites for being a transplant recipient and will see further feedback from her kidney doctor. Next home visit scheduled 10/15/23 @ 1:00.    Beatrix Shipper, EMT-Paramedic [phone] 10/08/2023

## 2023-10-15 ENCOUNTER — Other Ambulatory Visit (HOSPITAL_COMMUNITY): Payer: Self-pay | Admitting: Emergency Medicine

## 2023-10-15 ENCOUNTER — Telehealth (HOSPITAL_COMMUNITY): Payer: Self-pay | Admitting: Emergency Medicine

## 2023-10-15 ENCOUNTER — Other Ambulatory Visit: Payer: Self-pay

## 2023-10-15 ENCOUNTER — Other Ambulatory Visit (HOSPITAL_COMMUNITY): Payer: Self-pay

## 2023-10-15 DIAGNOSIS — K219 Gastro-esophageal reflux disease without esophagitis: Secondary | ICD-10-CM

## 2023-10-15 MED ORDER — ONDANSETRON HCL 8 MG PO TABS
8.0000 mg | ORAL_TABLET | Freq: Three times a day (TID) | ORAL | 0 refills | Status: DC | PRN
  Filled 2023-10-15: qty 42, 14d supply, fill #0

## 2023-10-15 NOTE — Progress Notes (Signed)
Paramedicine Encounter    Patient ID: Nancy [name], female    DOB: [birth year], 62 y.o.   MRN: [id number]   Complaints Episode of Nausea w/ vomiting last night none today  Assessment A&O x 4, skin W&D w/ good color.  No chest pain or SOB.  No noted edema  Compliance with meds YES  Pill box filled x 1 week  Refills needed Odansetron  Meds changes since last visit  NONE  Social changes NONE   BP (!) 150/80 (BP Location: Left Arm, Patient Position: Sitting, Cuff Size: Normal)   Pulse 74   Resp 16   Wt 230 lb (104.3 kg)   SpO2 100%   BMI 40.74 kg/m  Weight yesterday-not taken Last visit weight-230lb  Nancy [name] reports to be feeling good today.  She did say that last night she had an episode of nausea w/ vomiting but none today.  I sent message to Dr. Henriette Combs office requesting refill for her Zofran and same was called in for her.   Pt denies chest pain or SOB.  She has done well with her med compliance.  Med box reconciled x 1 week. Next home visit 10/22/23 @ 2:00.  ACTION: Home visit completed  Bethanie Dicker [phone] 10/15/23  Patient Care Team: Marcine Matar, MD as PCP - General (Internal Medicine) Runell Gess, MD as PCP - Cardiology (Cardiology) Laurey Morale, MD as PCP - Advanced Heart Failure (Cardiology) Center, [facility]  Patient Active Problem List   Diagnosis Date Noted   History of CVA (cerebrovascular accident) 03/22/2023   Community acquired pneumonia 03/22/2023   Acute respiratory failure with hypoxia (HCC) 11/04/2022   Pain, unspecified 08/15/2022   Complication of vascular dialysis catheter 08/07/2022   Chest pain 05/30/2022   Fluid overload 05/30/2022   Acute pulmonary edema (HCC) 05/30/2022   Iron deficiency anemia, unspecified 04/26/2022   Moderate protein-calorie malnutrition (HCC) 04/18/2022   Nausea 04/10/2022   Hyponatremia 04/04/2022   Prolonged QT interval 04/04/2022   Anaphylactic shock,  unspecified, initial encounter 02/23/2022   Coagulation defect, unspecified (HCC) 02/23/2022   Other allergy, initial encounter 02/23/2022   Pressure injury of skin 02/19/2022   Anemia in chronic kidney disease 02/16/2022   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) 02/16/2022   Hyperlipidemia, unspecified 02/16/2022   Hypertensive heart and chronic kidney disease with heart failure and with stage 5 chronic kidney disease, or end stage renal disease (HCC) 02/16/2022   Personal history of nicotine dependence 02/16/2022   Personal history of transient ischemic attack (TIA), and cerebral infarction without residual deficits 02/16/2022   Secondary hyperparathyroidism of renal origin (HCC) 02/16/2022   Type 2 diabetes mellitus with diabetic peripheral angiopathy without gangrene (HCC) 02/16/2022   Renal anasarca 02/14/2022   ESRD on dialysis (HCC) 02/14/2022   Hyperkalemia 02/14/2022   S/P arteriovenous (AV) fistula creation 12/22/2021   Critical limb ischemia of both lower extremities (HCC) 11/22/2021   Subacute osteomyelitis, right ankle and foot (HCC)    Peripheral artery disease (HCC) 10/27/2021   Acute exacerbation of congestive heart failure (HCC) 10/24/2021   Acute cardiogenic pulmonary edema (HCC) 10/06/2021   CAD (coronary artery disease) 10/06/2021   Goals of care, counseling/discussion    Gangrene of left foot (HCC)    Cellulitis of left foot    Diabetic foot ulcers (HCC) 09/04/2021   CHF (congestive heart failure) (HCC) 07/07/2021   Acute postoperative anemia due to expected blood loss superimposed on anemia of chronic renal insufficiency 06/30/2021  CKD (chronic kidney disease), stage IV (HCC) 06/11/2021   Chronic diastolic CHF (congestive heart failure) (HCC) 06/10/2021   Abnormal nuclear stress test    Dyspnea on exertion 03/07/2021   Orthopnea 02/25/2021   History of cerebrovascular accident (CVA) with residual deficit 05/06/2020   Diabetic peripheral neuropathy (HCC) 04/26/2020    AKI (acute kidney injury) (HCC) 04/20/2020   Generalized weakness 04/20/2020   Dehydration 04/20/2020   Generalized abdominal pain    Pancreatitis, acute 02/29/2020   Cerebrovascular accident (CVA) (HCC) 11/08/2019   Stenosis of right carotid artery 11/08/2019   Mixed diabetic hyperlipidemia associated with type 2 diabetes mellitus (HCC) 11/08/2019   Cortical age-related cataract of both eyes 08/30/2019   Gait abnormality 08/30/2019   Statin declined 08/30/2019   Gastroesophageal reflux disease without esophagitis 04/18/2018   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   Insulin dependent type 2 diabetes mellitus (HCC) 10/29/2017   History of macular degeneration 10/29/2017   Chronic cervical pain 10/29/2016   Myalgia 09/14/2016   Insomnia 09/14/2016   Tinea pedis of both feet 08/20/2016   Macular degeneration 08/17/2016   Low back pain 09/21/2014   Hypertensive urgency 09/21/2014   Diabetic gastroparesis associated with type 2 diabetes mellitus (HCC) 09/14/2014   HTN (hypertension) 09/08/2014   Thyroid nodule 08/17/2014   Morbid obesity (HCC) 08/17/2014   Hypokalemia 08/06/2014   Type II diabetes mellitus with neurological manifestations, uncontrolled 08/04/2014    Current Outpatient Medications:    albuterol (VENTOLIN HFA) 108 (90 Base) MCG/ACT inhaler, Inhale 2 puffs into the lungs every 6 (six) hours as needed., Disp: 18 g, Rfl: 3   amLODipine (NORVASC) 10 MG tablet, Take 1 tablet (10 mg total) by mouth daily for hypertension, Disp: 90 tablet, Rfl: 1   aspirin 81 MG EC tablet, Take 1 tablet (81 mg total) by mouth daily., Disp: 60 tablet, Rfl: 1   atorvastatin (LIPITOR) 40 MG tablet, Take 1 tablet (40 mg total) by mouth daily for antilipids, Disp: 90 tablet, Rfl: 1   ezetimibe (ZETIA) 10 MG tablet, Take 1 tablet (10 mg total) by mouth daily for antilipids, Disp: 90 tablet, Rfl: 1   isosorbide mononitrate (IMDUR) 30 MG 24 hr tablet, Take 2 tablets (60 mg total) by mouth daily for blood  pressure, Disp: 180 tablet, Rfl: 1   labetalol (NORMODYNE) 300 MG tablet, Take 1 tablet (300 mg total) by mouth 2 (two) times daily for beta blockers, Disp: 180 tablet, Rfl: 1   levothyroxine (SYNTHROID) 25 MCG tablet, Take 1 tablet (25 mcg total) by mouth daily for low thyroid hormone, Disp: 90 tablet, Rfl: 1   omeprazole (PRILOSEC) 20 MG capsule, Take 2 capsules (40 mg total) by mouth daily for GERD, Disp: 180 capsule, Rfl: 1   polyethylene glycol (MIRALAX / GLYCOLAX) 17 g packet, Give 17 gram by mouth one time a day for constipation (Patient taking differently: Take 1 packet by mouth daily. As needed), Disp: 14 packet, Rfl: 0   sevelamer carbonate (RENVELA) 800 MG tablet, Take 3 tablets (2,400 mg total) by mouth 3 (three) times daily with meals and 1 tablet with snacks., Disp: 1080 tablet, Rfl: 3   Accu-Chek Softclix Lancets lancets, Use to check blood sugar 3 times daily., Disp: 100 each, Rfl: 6   benzonatate (TESSALON) 100 MG capsule, Take 1 capsule (100 mg total) by mouth 3 (three) times daily as needed for cough. (Patient not taking: Reported on 10/08/2023), Disp: 21 capsule, Rfl: 0   Blood Glucose Monitoring Suppl (ACCU-CHEK GUIDE) w/Device KIT, Use  to check blood sugar 3 times daily., Disp: 1 kit, Rfl: 0   glucose blood (ACCU-CHEK GUIDE) test strip, Use to check blood sugar 3 times daily., Disp: 100 each, Rfl: 6   Insulin Pen Needle 32G X 4 MM MISC, Use to inject insulin as directed. (Patient not taking: Reported on 10/15/2023), Disp: 100 each, Rfl: 4   Multiple Vitamin (MULTI-VITAMIN DAILY) TABS, Take 1 tablet by mouth at bedtime for supplementation (Patient not taking: Reported on 10/15/2023), Disp: 14 tablet, Rfl: 0   ondansetron (ZOFRAN) 8 MG tablet, Take 1 tablet (8 mg total) by mouth every 8 (eight) hours as needed for nausea, Disp: 42 tablet, Rfl: 0   Zinc 220 (50 Zn) MG CAPS, Take 1 capsule by mouth daily for minerals, electrolytes (Patient not taking: Reported on 10/01/2023), Disp: 14  capsule, Rfl: 0 Allergies  Allergen Reactions   Hydralazine Other (See Comments)   Hydralazine Hcl Other (See Comments)    Hair loss   Hydrocodone Itching and Other (See Comments)    Upset stomach   Metformin And Related Nausea And Vomiting and Other (See Comments)    Stomach pains, also   Other Nausea Only and Other (See Comments)    Lettuce- Does not digest this!!   Plaquenil [Hydroxychloroquine Sulfate] Hives   Shellfish Allergy Nausea And Vomiting   Shellfish-Derived Products Nausea Only and Other (See Comments)    Caused an upset stomach   Shrimp (Diagnostic) Nausea Only and Other (See Comments)    Upset stomach    Sulfa Antibiotics Hives     Social History   Socioeconomic History   Marital status: Legally Separated    Spouse name: [name]   Number of children: 1   Years of education: some colle   Highest education level: Not on file  Occupational History   Occupation: Employed FT as Warden/ranger    Comment: [employer] , NA  Tobacco Use   Smoking status: Former    Current packs/day: 0.25    Average packs/day: 0.3 packs/day for 0.5 years (0.1 ttl pk-yrs)    Types: Cigarettes    Passive exposure: Never   Smokeless tobacco: Never   Tobacco comments:    Former smoker 11/03/21  Vaping Use   Vaping status: Never Used  Substance and Sexual Activity   Alcohol use: No   Drug use: No   Sexual activity: Not Currently    Birth control/protection: None  Other Topics Concern   Not on file  Social History Narrative   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    Social Determinants of Health   Financial Resource Strain: Low Risk  (09/28/2021)   Overall Financial Resource Strain (CARDIA)    Difficulty of Paying Living Expenses: Not very hard  Recent Concern: Physicist, medical Strain - High Risk (07/11/2021)   Overall Financial Resource Strain (CARDIA)    Difficulty of Paying Living Expenses: Hard  Food Insecurity: No Food Insecurity  (09/28/2021)   Hunger Vital Sign    Worried About Running Out of Food in the Last Year: Never true    Ran Out of Food in the Last Year: Never true  Recent Concern: Food Insecurity - Food Insecurity Present (07/11/2021)   Hunger Vital Sign    Worried About Running Out of Food in the Last Year: Sometimes true    Ran Out of Food in the Last Year: Sometimes true  Transportation Needs: No Transportation Needs (09/28/2021)   PRAPARE - Transportation  Lack of Transportation (Medical): No    Lack of Transportation (Non-Medical): No  Physical Activity: Not on file  Stress: Not on file  Social Connections: Not on file  Intimate Partner Violence: Not on file    Physical Exam      Future Appointments  Date Time Provider Department Center  10/24/2023 11:15 AM [facility]  11/21/2023 11:00 AM [facility] V[facility] 6 [facility]  11/21/2023 12:00 PM Victorino Sparrow, MD [facility]

## 2023-10-15 NOTE — Telephone Encounter (Signed)
Called to let [name] know her Zofran refill was called in by Dr. Vassie Moment office. She said her husband would pick it up for her.    Nancy [name], EMT-Paramedic [phone] 10/15/2023

## 2023-10-17 ENCOUNTER — Other Ambulatory Visit (HOSPITAL_COMMUNITY): Payer: Self-pay

## 2023-10-21 ENCOUNTER — Other Ambulatory Visit (HOSPITAL_COMMUNITY): Payer: Self-pay

## 2023-10-22 ENCOUNTER — Other Ambulatory Visit (HOSPITAL_COMMUNITY): Payer: Self-pay | Admitting: Emergency Medicine

## 2023-10-22 ENCOUNTER — Ambulatory Visit: Payer: Medicare Other | Attending: Internal Medicine

## 2023-10-22 VITALS — Ht 63.0 in | Wt 228.0 lb

## 2023-10-22 DIAGNOSIS — Z135 Encounter for screening for eye and ear disorders: Secondary | ICD-10-CM

## 2023-10-22 DIAGNOSIS — Z0000 Encounter for general adult medical examination without abnormal findings: Secondary | ICD-10-CM | POA: Diagnosis not present

## 2023-10-22 DIAGNOSIS — Z1211 Encounter for screening for malignant neoplasm of colon: Secondary | ICD-10-CM

## 2023-10-22 NOTE — Progress Notes (Signed)
Subjective:   Nancy [name] is a 62 y.o. female who presents for an Initial Medicare Annual Wellness Visit.  Visit Complete: Virtual I connected with  Rush Landmark on 10/22/23 by a audio enabled telemedicine application and verified that I am speaking with the correct person using two identifiers.  Patient Location: Home  Provider Location: Office/Clinic  I discussed the limitations of evaluation and management by telemedicine. The patient expressed understanding and agreed to proceed.  Vital Signs: Because this visit was a virtual/telehealth visit, some criteria may be missing or patient reported. Any vitals not documented were not able to be obtained and vitals that have been documented are patient reported.  Cardiac Risk Factors include: advanced age (>66men, >26 women);diabetes mellitus;family history of premature cardiovascular disease;dyslipidemia;hypertension;obesity (BMI >30kg/m2);sedentary lifestyle     Objective:    Today's Vitals   10/22/23 1001  Weight: 228 lb (103.4 kg)  Height: 5\' 3"  (1.6 m)  PainSc: 0-No pain   Body mass index is 40.39 kg/m.     10/22/2023   10:05 AM 07/24/2023   11:59 AM 03/24/2023   12:10 PM 02/06/2023    4:52 AM 09/25/2022   11:24 AM 08/07/2022    8:26 AM 05/29/2022    8:32 PM  Advanced Directives  Does Patient Have a Medical Advance Directive? No No No No No No No  Would patient like information on creating a medical advance directive? No - Patient declined  No - Patient declined  No - Patient declined No - Patient declined     Current Medications (verified) Outpatient Encounter Medications as of 10/22/2023  Medication Sig   Accu-Chek Softclix Lancets lancets Use to check blood sugar 3 times daily. (Patient not taking: Reported on 10/15/2023)   albuterol (VENTOLIN HFA) 108 (90 Base) MCG/ACT inhaler Inhale 2 puffs into the lungs every 6 (six) hours as needed.   amLODipine (NORVASC) 10 MG tablet Take 1 tablet (10 mg total) by  mouth daily for hypertension   aspirin 81 MG EC tablet Take 1 tablet (81 mg total) by mouth daily.   atorvastatin (LIPITOR) 40 MG tablet Take 1 tablet (40 mg total) by mouth daily for antilipids   benzonatate (TESSALON) 100 MG capsule Take 1 capsule (100 mg total) by mouth 3 (three) times daily as needed for cough. (Patient not taking: Reported on 10/08/2023)   Blood Glucose Monitoring Suppl (ACCU-CHEK GUIDE) w/Device KIT Use to check blood sugar 3 times daily. (Patient not taking: Reported on 10/15/2023)   ezetimibe (ZETIA) 10 MG tablet Take 1 tablet (10 mg total) by mouth daily for antilipids   glucose blood (ACCU-CHEK GUIDE) test strip Use to check blood sugar 3 times daily. (Patient not taking: Reported on 10/15/2023)   Insulin Pen Needle 32G X 4 MM MISC Use to inject insulin as directed. (Patient not taking: Reported on 10/15/2023)   isosorbide mononitrate (IMDUR) 30 MG 24 hr tablet Take 2 tablets (60 mg total) by mouth daily for blood pressure   labetalol (NORMODYNE) 300 MG tablet Take 1 tablet (300 mg total) by mouth 2 (two) times daily for beta blockers   levothyroxine (SYNTHROID) 25 MCG tablet Take 1 tablet (25 mcg total) by mouth daily for low thyroid hormone   Multiple Vitamin (MULTI-VITAMIN DAILY) TABS Take 1 tablet by mouth at bedtime for supplementation (Patient not taking: Reported on 10/15/2023)   omeprazole (PRILOSEC) 20 MG capsule Take 2 capsules (40 mg total) by mouth daily for GERD   ondansetron (ZOFRAN) 8 MG tablet  Take 1 tablet (8 mg total) by mouth every 8 (eight) hours as needed for nausea   polyethylene glycol (MIRALAX / GLYCOLAX) 17 g packet Give 17 gram by mouth one time a day for constipation (Patient taking differently: Take 1 packet by mouth daily. As needed)   sevelamer carbonate (RENVELA) 800 MG tablet Take 3 tablets (2,400 mg total) by mouth 3 (three) times daily with meals and 1 tablet with snacks.   Zinc 220 (50 Zn) MG CAPS Take 1 capsule by mouth daily for  minerals, electrolytes (Patient not taking: Reported on 10/01/2023)   [DISCONTINUED] Insulin NPH Isophane & Regular (RELION 70/30 [place]) Inject 35 Units into the skin 2 (two) times daily.   No facility-administered encounter medications on file as of 10/22/2023.    Allergies (verified) Hydralazine, Hydralazine hcl, Hydrocodone, Metformin and related, Other, Plaquenil [hydroxychloroquine sulfate], Shellfish allergy, Shellfish-derived products, Shrimp (diagnostic), and Sulfa antibiotics   History: Past Medical History:  Diagnosis Date   Anemia 2006   Anginal pain (HCC)    Carotid artery disease (HCC)    right ICA occlusion, 40-59% LICA 02/2021 [place]   CHF (congestive heart failure) (HCC)    Coronary artery disease    Depression 2014   previously on amitryptiline    Diabetes mellitus with neurological manifestation (HCC) 2006   Diabetic peripheral neuropathy (HCC) 04/26/2020   Dysphagia    Dyspnea    ESRD on hemodialysis (HCC)    Fracture of left ankle 1997   Gastroparesis 07/2016   Heart murmur    HOH (hard of hearing) 2004   Hyperlipidemia 2006   Hypertension 2006   IBS (irritable bowel syndrome) 2002   Leukopenia 2015   Macular degeneration 11/2019   Peripheral vascular disease (HCC)    Shingles 2009   Stroke [facility]) 2020   Thyroid nodule 2004   Past Surgical History:  Procedure Laterality Date   A/V FISTULAGRAM Right 09/25/2022   Procedure: A/V Fistulagram;  Surgeon: Nada Libman, MD;  Location: [facility] INVASIVE CV LAB;  Service: Cardiovascular;  Laterality: Right;   ABDOMINAL HYSTERECTOMY  2005   AMPUTATION Bilateral 04/04/2022   Procedure: BILATERAL BELOW KNEE AMPUTATION;  Surgeon: Nadara Mustard, MD;  Location: [facility] OR;  Service: Orthopedics;  Laterality: Bilateral;   AV FISTULA PLACEMENT Right 12/22/2021   Procedure: RIGHT ARM ARTERIOVENOUS (AV) BRACIOCPHELAIC FISTULA CREATION;  Surgeon: Maeola Harman, MD;  Location: [facility] OR;  Service: Vascular;  Laterality: Right;   AV  FISTULA PLACEMENT Right 02/19/2022   Procedure: RIGHT ARTERIOVENOUS FISTULA;  Surgeon: Chuck Hint, MD;  Location: [facility] OR;  Service: Vascular;  Laterality: Right;   BASCILIC VEIN TRANSPOSITION Right 08/07/2022   Procedure: SECOND STAGE RIGHT BASILIC VEIN TRANSPOSITION;  Surgeon: Chuck Hint, MD;  Location: [facility] OR;  Service: Vascular;  Laterality: Right;   CATARACT EXTRACTION Left 11/2019   CESAREAN SECTION  1983    CHOLECYSTECTOMY N/A 03/05/2020   Procedure: LAPAROSCOPIC CHOLECYSTECTOMY WITH INTRAOPERATIVE CHOLANGIOGRAM;  Surgeon: Manus Rudd, MD;  Location: [facility] ORS;  Service: General;  Laterality: N/A;   ESOPHAGOGASTRODUODENOSCOPY (EGD) WITH PROPOFOL Left 08/26/2014   Procedure: ESOPHAGOGASTRODUODENOSCOPY (EGD) WITH PROPOFOL;  Surgeon: Willis Modena, MD;  Location: [facility] ENDOSCOPY;  Service: Endoscopy;  Laterality: Left;   ESOPHAGOGASTRODUODENOSCOPY (EGD) WITH PROPOFOL N/A 03/03/2020   Procedure: ESOPHAGOGASTRODUODENOSCOPY (EGD) WITH PROPOFOL;  Surgeon: Beverley Fiedler, MD;  Location: [facility];  Service: Gastroenterology;  Laterality: N/A;   IR FLUORO GUIDE CV LINE RIGHT  02/15/2022   IR US GUIDE VASC ACCESS  RIGHT  02/15/2022   PERIPHERAL VASCULAR BALLOON ANGIOPLASTY Right 09/25/2022   Procedure: PERIPHERAL VASCULAR BALLOON ANGIOPLASTY;  Surgeon: Nada Libman, MD;  Location: [facility];  Service: Cardiovascular;  Laterality: Right;  AVF   RIGHT HEART CATH N/A 07/14/2021   Procedure: RIGHT HEART CATH;  Surgeon: Laurey Morale, MD;  Location: [facility] INVASIVE CV LAB;  Service: Cardiovascular;  Laterality: N/A;   RIGHT/LEFT HEART CATH AND CORONARY ANGIOGRAPHY N/A 04/27/2021   Procedure: RIGHT/LEFT HEART CATH AND CORONARY ANGIOGRAPHY;  Surgeon: Runell Gess, MD;  Location: [facility]AS[facility];  Service: Cardiovascular;  Laterality: N/A;   Family History  Problem Relation Age of Onset   Hypertension Mother    Heart disease Mother    Diabetes Mother    Thyroid disease  Mother    Congestive Heart Failure Mother    Breast cancer Maternal Grandmother    Colon cancer Maternal Grandfather    Heart attack Sister    Heart disease Brother    Hyperlipidemia Brother    Hypertension Brother    Diabetes Father    Breast cancer Maternal Aunt    Social History   Socioeconomic History   Marital status: Legally Separated    Spouse name: [name]   Number of children: 1   Years of education: some colle   Highest education level: Not on file  Occupational History   Occupation: Employed FT as Warden/ranger    Comment: [employer] , NA  Tobacco Use   Smoking status: Former    Current packs/day: 0.25    Average packs/day: 0.3 packs/day for 0.5 years (0.1 ttl pk-yrs)    Types: Cigarettes    Passive exposure: Never   Smokeless tobacco: Never   Tobacco comments:    Former smoker 11/03/21  Vaping Use   Vaping status: Never Used  Substance and Sexual Activity   Alcohol use: No   Drug use: No   Sexual activity: Not Currently    Birth control/protection: None  Other Topics Concern   Not on file  Social History Narrative   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    Social Determinants of Health   Financial Resource Strain: Low Risk  (10/22/2023)   Overall Financial Resource Strain (CARDIA)    Difficulty of Paying Living Expenses: Not very hard  Food Insecurity: No Food Insecurity (10/22/2023)   Hunger Vital Sign    Worried About Running Out of Food in the Last Year: Never true    Ran Out of Food in the Last Year: Never true  Transportation Needs: No Transportation Needs (10/22/2023)   PRAPARE - Administrator, Civil Service (Medical): No    Lack of Transportation (Non-Medical): No  Physical Activity: Inactive (10/22/2023)   Exercise Vital Sign    Days of Exercise per Week: 0 days    Minutes of Exercise per Session: 0 min  Stress: No Stress Concern Present (10/22/2023)   [employer] of Occupational Health -  Occupational Stress Questionnaire    Feeling of Stress : Not at all  Social Connections: Unknown (10/22/2023)   Social Connection and Isolation Panel [NHANES]    Frequency of Communication with Friends and Family: More than three times a week    Frequency of Social Gatherings with Friends and Family: More than three times a week    Attends Religious Services: Not on file    Active Member of Clubs or Organizations: Yes    Attends Club or  Organization Meetings: Not on file    Marital Status: Married    Tobacco Counseling Counseling given: Not Answered Tobacco comments: Former smoker 11/03/21   Clinical Intake:  Pre-visit preparation completed: Yes  Pain : No/denies pain Pain Score: 0-No pain     BMI - recorded: 40.39 Nutritional Status: BMI > 30  Obese Nutritional Risks: None Diabetes: No  How often do you need to have someone help you when you read instructions, pamphlets, or other written materials from your doctor or pharmacy?: 1 - Never What is the last grade level you completed in school?: 1.5 YEARS OF COLLEGE  Interpreter Needed?: No  Information entered by :: Tierria Watson N. Dyllon Henken, LPN.   Activities of Daily Living    10/22/2023   10:07 AM 03/24/2023   12:10 PM  In your present state of health, do you have any difficulty performing the following activities:  Hearing? 1   Vision? 0   Difficulty concentrating or making decisions? 1   Walking or climbing stairs? 1   Dressing or bathing? 1   Doing errands, shopping? 1 0  Preparing Food and eating ? Y   Using the Toilet? Y   In the past six months, have you accidently leaked urine? Y   Comment wear Depends   Do you have problems with loss of bowel control? Y   Managing your Medications? Y   Managing your Finances? Y   Housekeeping or managing your Housekeeping? Y     Patient Care Team: Marcine Matar, MD as PCP - General (Internal Medicine) Runell Gess, MD as PCP - Cardiology (Cardiology) Laurey Morale, MD as PCP - Advanced Heart Failure (Cardiology) Center, [facility]  Indicate any recent Medical Services you may have received from other than [facility] providers in the past year (date may be approximate).     Assessment:   This is a routine wellness examination for [place].  Hearing/Vision screen Hearing Screening - Comments:: Some hearing difficulties; but no hearing aid.  Vision Screening - Comments:: Wears readers- not up to date with routine eye exams with [facility]    Goals Addressed             This Visit's Progress    Client understands the importance of follow-up with providers by attending scheduled visits        Depression Screen    10/22/2023   10:06 AM 10/20/2021   11:36 AM 08/01/2021    9:45 AM 06/30/2021   11:07 AM 02/06/2021   11:08 AM 02/06/2021   10:15 AM 07/05/2020   10:08 AM  PHQ 2/9 Scores  PHQ - 2 Score 0 0 0 1   1  PHQ- 9 Score 2  0    3     Information is confidential and restricted. Go to Review Flowsheets to unlock data.    Fall Risk    10/22/2023   10:06 AM 10/20/2021   11:35 AM 07/05/2020   10:07 AM 08/27/2019    4:37 PM 08/27/2018   11:05 AM  Fall Risk   Falls in the past year? 0 0 0 1 No  Number falls in past yr: 0 0  1   Injury with Fall? 0 0  0   Risk for fall due to : No Fall Risks No Fall Risks     Follow up Falls prevention discussed   Education provided     MEDICARE RISK AT HOME: Medicare Risk at Home Any stairs in or  around the home?: No If so, are there any without handrails?: No Home free of loose throw rugs in walkways, pet beds, electrical cords, etc?: Yes Adequate lighting in your home to reduce risk of falls?: Yes Life alert?: No Use of a cane, walker or w/c?: Yes Grab bars in the bathroom?: No Shower chair or bench in shower?: Yes Elevated toilet seat or a handicapped toilet?: Yes  TIMED UP AND GO:  Was the test performed? No    Cognitive Function:    10/22/2023   10:13 AM  MMSE  - Mini Mental State Exam  Not completed: Unable to complete        10/22/2023   10:13 AM  6CIT Screen  What Year? 0 points  What month? 0 points  What time? 0 points  Count back from 20 0 points  Months in reverse 0 points  Repeat phrase 0 points  Total Score 0 points    Immunizations Immunization History  Administered Date(s) Administered   Hepb-cpg 03/28/2022, 04/25/2022   Influenza Inj Mdck Quad Pf 12/06/2018   Influenza Whole 11/05/2019   Influenza, Quadrivalent, Recombinant, Inj, Pf 10/05/2022   Influenza,inj,Quad PF,6+ Mos 08/17/2014, 08/17/2016, 03/12/2022   PNEUMOCOCCAL CONJUGATE-20 03/16/2022    TDAP status: Due, Education has been provided regarding the importance of this vaccine. Advised may receive this vaccine at local pharmacy or Health Dept. Aware to provide a copy of the vaccination record if obtained from local pharmacy or Health Dept. Verbalized acceptance and understanding.  Flu Vaccine status: Up to date  Pneumococcal vaccine status: Due, Education has been provided regarding the importance of this vaccine. Advised may receive this vaccine at local pharmacy or Health Dept. Aware to provide a copy of the vaccination record if obtained from local pharmacy or Health Dept. Verbalized acceptance and understanding.  Covid-19 vaccine status: Information provided on how to obtain vaccines.   Qualifies for Shingles Vaccine? Yes   Zostavax completed No   Shingrix Completed?: No.    Education has been provided regarding the importance of this vaccine. Patient has been advised to call insurance company to determine out of pocket expense if they have not yet received this vaccine. Advised may also receive vaccine at local pharmacy or Health Dept. Verbalized acceptance and understanding.  Screening Tests Health Maintenance  Topic Date Due   DTaP/Tdap/Td (1 - Tdap) Never done   Colonoscopy  Never done   Zoster Vaccines- Shingrix (1 of 2) Never done   FOOT EXAM   10/29/2018   OPHTHALMOLOGY EXAM  11/17/2020   COVID-19 Vaccine (1 - 2023-24 season) Never done   MAMMOGRAM  10/21/2024 (Originally 03/05/2014)   HEMOGLOBIN A1C  02/14/2024   Medicare Annual Wellness (AWV)  10/21/2024   INFLUENZA VACCINE  Completed   Hepatitis C Screening  Completed   HIV Screening  Completed   HPV VACCINES  Aged Out    Health Maintenance  Health Maintenance Due  Topic Date Due   DTaP/Tdap/Td (1 - Tdap) Never done   Colonoscopy  Never done   Zoster Vaccines- Shingrix (1 of 2) Never done   FOOT EXAM  10/29/2018   OPHTHALMOLOGY EXAM  11/17/2020   COVID-19 Vaccine (1 - 2023-24 season) Never done    Colorectal cancer screening: Referral to GI placed 10/22/2023. Pt aware the office will call re: appt.  Mammogram status: Patient declined.  Bone Density status: Never done.  Lung Cancer Screening: (Low Dose CT Chest recommended if Age 29-80 years, 20 pack-year currently smoking OR have  quit w/in 15years.) does not qualify.   Lung Cancer Screening Referral: no  Additional Screening:  Hepatitis C Screening: does qualify; Completed 05/31/2022  Vision Screening: Recommended annual ophthalmology exams for early detection of glaucoma and other disorders of the eye. Is the patient up to date with their annual eye exam?  No  Who is the provider or what is the name of the office in which the patient attends annual eye exams? [facility] If pt is not established with a provider, would they like to be referred to a provider to establish care? Yes . Possible cataracts.  Dental Screening: Recommended annual dental exams for proper oral hygiene  Diabetic Foot Exam: Diabetic Foot Exam: Completed 10/29/2017  Community Resource Referral / Chronic Care Management: CRR required this visit?  No   CCM required this visit?  PCP informed of CCM need     Plan:     I have personally reviewed and noted the following in the patient's chart:    Medical and social history Use of alcohol, tobacco or illicit drugs  Current medications and supplements including opioid prescriptions. Patient is not currently taking opioid prescriptions. Functional ability and status Nutritional status Physical activity Advanced directives List of other physicians Hospitalizations, surgeries, and ER visits in previous 12 months Vitals Screenings to include cognitive, depression, and falls Referrals and appointments  In addition, I have reviewed and discussed with patient certain preventive protocols, quality metrics, and best practice recommendations. A written personalized care plan for preventive services as well as general preventive health recommendations were provided to patient.     Mickeal Needy, LPN   13/01/4400   After Visit Summary: (MyChart) Due to this being a telephonic visit, the after visit summary with patients personalized plan was offered to patient via MyChart   Nurse Notes: None

## 2023-10-22 NOTE — Progress Notes (Unsigned)
Paramedicine Encounter    Patient ID: Nancy [name], female    DOB: 1961/10/02, 62 y.o.   MRN: [id number]   Complaints Weight and BP elevated.  Constipation   Assessment A&O x 4, skin W&D w/ good color.  Lung sounds clear and equal bilat No bowel movement x 2 days  Compliance with meds Missed 2 doses of Isosorbide  Pill box filled x 1 week  Refills needed NONE  Meds changes since last visit NONE    Social changes NONE   BP (!) 158/76 (BP Location: Right Arm, Patient Position: Sitting, Cuff Size: Normal)   Pulse 85   Resp 16   Wt 242 lb 12.8 oz (110.1 kg)   SpO2 99%   BMI 43.01 kg/m  Weight yesterday-not taken Last visit weight-230lb  ACTION: {Paramed [phone]}  Beatrix Shipper, EMT-Paramedic [phone] 10/22/23  Patient Care Team: Marcine Matar, MD as PCP - General (Internal Medicine) Runell Gess, MD as PCP - Cardiology (Cardiology) Laurey Morale, MD as PCP - Advanced Heart Failure (Cardiology) Center, [facility]  Patient Active Problem List   Diagnosis Date Noted   History of CVA (cerebrovascular accident) 03/22/2023   Community acquired pneumonia 03/22/2023   Acute respiratory failure with hypoxia (HCC) 11/04/2022   Pain, unspecified 08/15/2022   Complication of vascular dialysis catheter 08/07/2022   Chest pain 05/30/2022   Fluid overload 05/30/2022   Acute pulmonary edema (HCC) 05/30/2022   Iron deficiency anemia, unspecified 04/26/2022   Moderate protein-calorie malnutrition (HCC) 04/18/2022   Nausea 04/10/2022   Hyponatremia 04/04/2022   Prolonged QT interval 04/04/2022   Anaphylactic shock, unspecified, initial encounter 02/23/2022   Coagulation defect, unspecified (HCC) 02/23/2022   Other allergy, initial encounter 02/23/2022   Pressure injury of skin 02/19/2022   Anemia in chronic kidney disease 02/16/2022   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) 02/16/2022   Hyperlipidemia, unspecified 02/16/2022   Hypertensive  heart and chronic kidney disease with heart failure and with stage 5 chronic kidney disease, or end stage renal disease (HCC) 02/16/2022   Personal history of nicotine dependence 02/16/2022   Personal history of transient ischemic attack (TIA), and cerebral infarction without residual deficits 02/16/2022   Secondary hyperparathyroidism of renal origin (HCC) 02/16/2022   Type 2 diabetes mellitus with diabetic peripheral angiopathy without gangrene (HCC) 02/16/2022   Renal anasarca 02/14/2022   ESRD on dialysis (HCC) 02/14/2022   Hyperkalemia 02/14/2022   S/P arteriovenous (AV) fistula creation 12/22/2021   Critical limb ischemia of both lower extremities (HCC) 11/22/2021   Subacute osteomyelitis, right ankle and foot (HCC)    Peripheral artery disease (HCC) 10/27/2021   Acute exacerbation of congestive heart failure (HCC) 10/24/2021   Acute cardiogenic pulmonary edema (HCC) 10/06/2021   CAD (coronary artery disease) 10/06/2021   Goals of care, counseling/discussion    Gangrene of left foot (HCC)    Cellulitis of left foot    Diabetic foot ulcers (HCC) 09/04/2021   CHF (congestive heart failure) (HCC) 07/07/2021   Acute postoperative anemia due to expected blood loss superimposed on anemia of chronic renal insufficiency 06/30/2021   CKD (chronic kidney disease), stage IV (HCC) 06/11/2021   Chronic diastolic CHF (congestive heart failure) (HCC) 06/10/2021   Abnormal nuclear stress test    Dyspnea on exertion 03/07/2021   Orthopnea 02/25/2021   History of cerebrovascular accident (CVA) with residual deficit 05/06/2020   Diabetic peripheral neuropathy (HCC) 04/26/2020   AKI (acute kidney injury) (HCC) 04/20/2020   Generalized weakness 04/20/2020   Dehydration  04/20/2020   Generalized abdominal pain    Pancreatitis, acute 02/29/2020   Cerebrovascular accident (CVA) (HCC) 11/08/2019   Stenosis of right carotid artery 11/08/2019   Mixed diabetic hyperlipidemia associated with type 2  diabetes mellitus (HCC) 11/08/2019   Cortical age-related cataract of both eyes 08/30/2019   Gait abnormality 08/30/2019   Statin declined 08/30/2019   Gastroesophageal reflux disease without esophagitis 04/18/2018   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   Insulin dependent type 2 diabetes mellitus (HCC) 10/29/2017   History of macular degeneration 10/29/2017   Chronic cervical pain 10/29/2016   Myalgia 09/14/2016   Insomnia 09/14/2016   Tinea pedis of both feet 08/20/2016   Macular degeneration 08/17/2016   Low back pain 09/21/2014   Hypertensive urgency 09/21/2014   Diabetic gastroparesis associated with type 2 diabetes mellitus (HCC) 09/14/2014   HTN (hypertension) 09/08/2014   Thyroid nodule 08/17/2014   Morbid obesity (HCC) 08/17/2014   Hypokalemia 08/06/2014   Type II diabetes mellitus with neurological manifestations, uncontrolled 08/04/2014    Current Outpatient Medications:    Accu-Chek Softclix Lancets lancets, Use to check blood sugar 3 times daily. (Patient not taking: Reported on 10/15/2023), Disp: 100 each, Rfl: 6   albuterol (VENTOLIN HFA) 108 (90 Base) MCG/ACT inhaler, Inhale 2 puffs into the lungs every 6 (six) hours as needed., Disp: 18 g, Rfl: 3   amLODipine (NORVASC) 10 MG tablet, Take 1 tablet (10 mg total) by mouth daily for hypertension, Disp: 90 tablet, Rfl: 1   aspirin 81 MG EC tablet, Take 1 tablet (81 mg total) by mouth daily., Disp: 60 tablet, Rfl: 1   atorvastatin (LIPITOR) 40 MG tablet, Take 1 tablet (40 mg total) by mouth daily for antilipids, Disp: 90 tablet, Rfl: 1   benzonatate (TESSALON) 100 MG capsule, Take 1 capsule (100 mg total) by mouth 3 (three) times daily as needed for cough. (Patient not taking: Reported on 10/08/2023), Disp: 21 capsule, Rfl: 0   Blood Glucose Monitoring Suppl (ACCU-CHEK GUIDE) w/Device KIT, Use to check blood sugar 3 times daily. (Patient not taking: Reported on 10/15/2023), Disp: 1 kit, Rfl: 0   ezetimibe (ZETIA) 10 MG tablet, Take 1  tablet (10 mg total) by mouth daily for antilipids, Disp: 90 tablet, Rfl: 1   glucose blood (ACCU-CHEK GUIDE) test strip, Use to check blood sugar 3 times daily. (Patient not taking: Reported on 10/15/2023), Disp: 100 each, Rfl: 6   Insulin Pen Needle 32G X 4 MM MISC, Use to inject insulin as directed. (Patient not taking: Reported on 10/15/2023), Disp: 100 each, Rfl: 4   isosorbide mononitrate (IMDUR) 30 MG 24 hr tablet, Take 2 tablets (60 mg total) by mouth daily for blood pressure, Disp: 180 tablet, Rfl: 1   labetalol (NORMODYNE) 300 MG tablet, Take 1 tablet (300 mg total) by mouth 2 (two) times daily for beta blockers, Disp: 180 tablet, Rfl: 1   levothyroxine (SYNTHROID) 25 MCG tablet, Take 1 tablet (25 mcg total) by mouth daily for low thyroid hormone, Disp: 90 tablet, Rfl: 1   Multiple Vitamin (MULTI-VITAMIN DAILY) TABS, Take 1 tablet by mouth at bedtime for supplementation (Patient not taking: Reported on 10/15/2023), Disp: 14 tablet, Rfl: 0   omeprazole (PRILOSEC) 20 MG capsule, Take 2 capsules (40 mg total) by mouth daily for GERD, Disp: 180 capsule, Rfl: 1   ondansetron (ZOFRAN) 8 MG tablet, Take 1 tablet (8 mg total) by mouth every 8 (eight) hours as needed for nausea, Disp: 42 tablet, Rfl: 0   polyethylene glycol (MIRALAX /  GLYCOLAX) 17 g packet, Give 17 gram by mouth one time a day for constipation (Patient taking differently: Take 1 packet by mouth daily. As needed), Disp: 14 packet, Rfl: 0   sevelamer carbonate (RENVELA) 800 MG tablet, Take 3 tablets (2,400 mg total) by mouth 3 (three) times daily with meals and 1 tablet with snacks., Disp: 1080 tablet, Rfl: 3   Zinc 220 (50 Zn) MG CAPS, Take 1 capsule by mouth daily for minerals, electrolytes (Patient not taking: Reported on 10/01/2023), Disp: 14 capsule, Rfl: 0 Allergies  Allergen Reactions   Hydralazine Other (See Comments)   Hydralazine Hcl Other (See Comments)    Hair loss   Hydrocodone Itching and Other (See Comments)    Upset  stomach   Metformin And Related Nausea And Vomiting and Other (See Comments)    Stomach pains, also   Other Nausea Only and Other (See Comments)    Lettuce- Does not digest this!!   Plaquenil [Hydroxychloroquine Sulfate] Hives   Shellfish Allergy Nausea And Vomiting   Shellfish-Derived Products Nausea Only and Other (See Comments)    Caused an upset stomach   Shrimp (Diagnostic) Nausea Only and Other (See Comments)    Upset stomach    Sulfa Antibiotics Hives     Social History   Socioeconomic History   Marital status: Legally Separated    Spouse name: [name]   Number of children: 1   Years of education: some colle   Highest education level: Not on file  Occupational History   Occupation: Employed FT as Warden/ranger    Comment: [employer] , NA  Tobacco Use   Smoking status: Former    Current packs/day: 0.25    Average packs/day: 0.3 packs/day for 0.5 years (0.1 ttl pk-yrs)    Types: Cigarettes    Passive exposure: Never   Smokeless tobacco: Never   Tobacco comments:    Former smoker 11/03/21  Vaping Use   Vaping status: Never Used  Substance and Sexual Activity   Alcohol use: No   Drug use: No   Sexual activity: Not Currently    Birth control/protection: None  Other Topics Concern   Not on file  Social History Narrative   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    Social Determinants of Health   Financial Resource Strain: Low Risk  (10/22/2023)   Overall Financial Resource Strain (CARDIA)    Difficulty of Paying Living Expenses: Not very hard  Food Insecurity: No Food Insecurity (10/22/2023)   Hunger Vital Sign    Worried About Running Out of Food in the Last Year: Never true    Ran Out of Food in the Last Year: Never true  Transportation Needs: No Transportation Needs (10/22/2023)   PRAPARE - Administrator, Civil Service (Medical): No    Lack of Transportation (Non-Medical): No  Physical Activity: Inactive  (10/22/2023)   Exercise Vital Sign    Days of Exercise per Week: 0 days    Minutes of Exercise per Session: 0 min  Stress: No Stress Concern Present (10/22/2023)   [employer] of Occupational Health - Occupational Stress Questionnaire    Feeling of Stress : Not at all  Social Connections: Unknown (10/22/2023)   Social Connection and Isolation Panel [NHANES]    Frequency of Communication with Friends and Family: More than three times a week    Frequency of Social Gatherings with Friends and Family: More than three times a week    Attends  Religious Services: Not on file    Active Member of Clubs or Organizations: Yes    Attends Club or Organization Meetings: Not on file    Marital Status: Married  Intimate Partner Violence: Not At Risk (10/22/2023)   Humiliation, Afraid, Rape, and Kick questionnaire    Fear of Current or Ex-Partner: No    Emotionally Abused: No    Physically Abused: No    Sexually Abused: No    Physical Exam      Future Appointments  Date Time Provider Department Center  10/24/2023 11:15 AM [facility] ECHO/CH OP [facility]  11/21/2023 11:00 AM MC-CV HS VASC 6 [facility]  11/21/2023 12:00 PM Victorino Sparrow, MD [facility]

## 2023-10-22 NOTE — Patient Instructions (Signed)
Ms. Burbano , Thank you for taking time to come for your Medicare Wellness Visit. I appreciate your ongoing commitment to your health goals. Please review the following plan we discussed and let me know if I can assist you in the future.   Referrals/Orders/Follow-Ups/Clinician Recommendations: Yes  This is a list of the screening recommended for you and due dates:  Health Maintenance  Topic Date Due   DTaP/Tdap/Td vaccine (1 - Tdap) Never done   Colon Cancer Screening  Never done   Zoster (Shingles) Vaccine (1 of 2) Never done   Complete foot exam   10/29/2018   Eye exam for diabetics  11/17/2020   COVID-19 Vaccine (1 - 2023-24 season) Never done   Mammogram  10/21/2024*   Hemoglobin A1C  02/14/2024   Medicare Annual Wellness Visit  10/21/2024   Flu Shot  Completed   Hepatitis C Screening  Completed   HIV Screening  Completed   HPV Vaccine  Aged Out  *Topic was postponed. The date shown is not the original due date.    Advanced directives: (Declined) Advance directive discussed with you today. Even though you declined this today, please call our office should you change your mind, and we can give you the proper paperwork for you to fill out.  Next Medicare Annual Wellness Visit scheduled for next year: No

## 2023-10-24 ENCOUNTER — Other Ambulatory Visit: Payer: Self-pay

## 2023-10-24 ENCOUNTER — Ambulatory Visit (HOSPITAL_COMMUNITY)
Admission: RE | Admit: 2023-10-24 | Discharge: 2023-10-24 | Disposition: A | Discharge status: Home / Self Care | Payer: Medicare Other | Source: Ambulatory Visit | Attending: Internal Medicine | Admitting: Internal Medicine

## 2023-10-24 DIAGNOSIS — I509 Heart failure, unspecified: Secondary | ICD-10-CM | POA: Diagnosis not present

## 2023-10-24 DIAGNOSIS — I2510 Atherosclerotic heart disease of native coronary artery without angina pectoris: Secondary | ICD-10-CM | POA: Insufficient documentation

## 2023-10-24 DIAGNOSIS — E785 Hyperlipidemia, unspecified: Secondary | ICD-10-CM | POA: Diagnosis not present

## 2023-10-24 DIAGNOSIS — E119 Type 2 diabetes mellitus without complications: Secondary | ICD-10-CM | POA: Insufficient documentation

## 2023-10-24 DIAGNOSIS — N186 End stage renal disease: Secondary | ICD-10-CM

## 2023-10-24 DIAGNOSIS — I5032 Chronic diastolic (congestive) heart failure: Secondary | ICD-10-CM | POA: Diagnosis not present

## 2023-10-24 DIAGNOSIS — I110 Hypertensive heart disease with heart failure: Secondary | ICD-10-CM | POA: Insufficient documentation

## 2023-10-24 LAB — ECHOCARDIOGRAM COMPLETE
Area-P 1/2: 3.42 cm2
Calc EF: 54.1 %
S' Lateral: 3.1 cm
Single Plane A2C EF: 54.5 %
Single Plane A4C EF: 54.1 %

## 2023-10-29 ENCOUNTER — Other Ambulatory Visit (HOSPITAL_COMMUNITY): Payer: Self-pay | Admitting: Emergency Medicine

## 2023-10-29 ENCOUNTER — Other Ambulatory Visit: Payer: Self-pay

## 2023-10-29 NOTE — Progress Notes (Signed)
Paramedicine Encounter    Patient ID: Nancy [name], female    DOB: 10-11-1961, 63 y.o.   MRN: [id number]   Complaints Constipation   Assessment A&O x 4, skin W&D w/ good color.  Compliance with meds YES  Pill box filled x 1 week  Refills needed:NONE  Meds changes since last visit none    Social changes none   BP (!) 160/90 (BP Location: Right Arm, Patient Position: Sitting, Cuff Size: Normal) Comment: palpated  Pulse 82   Resp 16   Wt 235 lb (106.6 kg)   SpO2 97%   BMI 41.63 kg/m  Weight yesterday- not taken Last visit weight-242lb  ATF Nancy [name] A&O x 4, skin W&D w/ good color.  Nancy [name] has been compliant w/ all meds.  She denies chest pain or SOB.  No neurological deficits noted.  No headaches or weakness noted.   Today her BP is elevated.  She tells me that she stopped 30 minutes short of finishing her dialysis yesterday.   Med box reconciled x 1 week.  Msg sent to HF triage regarding blood pressures and possible desired med changes if any.  ACTION: Home visit completed  Bethanie Dicker [phone] 10/29/23  Patient Care Team: Marcine Matar, MD as PCP - General (Internal Medicine) Runell Gess, MD as PCP - Cardiology (Cardiology) Laurey Morale, MD as PCP - Advanced Heart Failure (Cardiology) Center, [facility]  Patient Active Problem List   Diagnosis Date Noted   History of CVA (cerebrovascular accident) 03/22/2023   Community acquired pneumonia 03/22/2023   Acute respiratory failure with hypoxia (HCC) 11/04/2022   Pain, unspecified 08/15/2022   Complication of vascular dialysis catheter 08/07/2022   Chest pain 05/30/2022   Fluid overload 05/30/2022   Acute pulmonary edema (HCC) 05/30/2022   Iron deficiency anemia, unspecified 04/26/2022   Moderate protein-calorie malnutrition (HCC) 04/18/2022   Nausea 04/10/2022   Hyponatremia 04/04/2022   Prolonged QT interval 04/04/2022   Anaphylactic shock,  unspecified, initial encounter 02/23/2022   Coagulation defect, unspecified (HCC) 02/23/2022   Other allergy, initial encounter 02/23/2022   Pressure injury of skin 02/19/2022   Anemia in chronic kidney disease 02/16/2022   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) 02/16/2022   Hyperlipidemia, unspecified 02/16/2022   Hypertensive heart and chronic kidney disease with heart failure and with stage 5 chronic kidney disease, or end stage renal disease (HCC) 02/16/2022   Personal history of nicotine dependence 02/16/2022   Personal history of transient ischemic attack (TIA), and cerebral infarction without residual deficits 02/16/2022   Secondary hyperparathyroidism of renal origin (HCC) 02/16/2022   Type 2 diabetes mellitus with diabetic peripheral angiopathy without gangrene (HCC) 02/16/2022   Renal anasarca 02/14/2022   ESRD on dialysis (HCC) 02/14/2022   Hyperkalemia 02/14/2022   S/P arteriovenous (AV) fistula creation 12/22/2021   Critical limb ischemia of both lower extremities (HCC) 11/22/2021   Subacute osteomyelitis, right ankle and foot (HCC)    Peripheral artery disease (HCC) 10/27/2021   Acute exacerbation of congestive heart failure (HCC) 10/24/2021   Acute cardiogenic pulmonary edema (HCC) 10/06/2021   CAD (coronary artery disease) 10/06/2021   Goals of care, counseling/discussion    Gangrene of left foot (HCC)    Cellulitis of left foot    Diabetic foot ulcers (HCC) 09/04/2021   CHF (congestive heart failure) (HCC) 07/07/2021   Acute postoperative anemia due to expected blood loss superimposed on anemia of chronic renal insufficiency 06/30/2021   CKD (chronic kidney disease), stage IV (  HCC) 06/11/2021   Chronic diastolic CHF (congestive heart failure) (HCC) 06/10/2021   Abnormal nuclear stress test    Dyspnea on exertion 03/07/2021   Orthopnea 02/25/2021   History of cerebrovascular accident (CVA) with residual deficit 05/06/2020   Diabetic peripheral neuropathy (HCC) 04/26/2020    AKI (acute kidney injury) (HCC) 04/20/2020   Generalized weakness 04/20/2020   Dehydration 04/20/2020   Generalized abdominal pain    Pancreatitis, acute 02/29/2020   Cerebrovascular accident (CVA) (HCC) 11/08/2019   Stenosis of right carotid artery 11/08/2019   Mixed diabetic hyperlipidemia associated with type 2 diabetes mellitus (HCC) 11/08/2019   Cortical age-related cataract of both eyes 08/30/2019   Gait abnormality 08/30/2019   Statin declined 08/30/2019   Gastroesophageal reflux disease without esophagitis 04/18/2018   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   Insulin dependent type 2 diabetes mellitus (HCC) 10/29/2017   History of macular degeneration 10/29/2017   Chronic cervical pain 10/29/2016   Myalgia 09/14/2016   Insomnia 09/14/2016   Tinea pedis of both feet 08/20/2016   Macular degeneration 08/17/2016   Low back pain 09/21/2014   Hypertensive urgency 09/21/2014   Diabetic gastroparesis associated with type 2 diabetes mellitus (HCC) 09/14/2014   HTN (hypertension) 09/08/2014   Thyroid nodule 08/17/2014   Morbid obesity (HCC) 08/17/2014   Hypokalemia 08/06/2014   Type II diabetes mellitus with neurological manifestations, uncontrolled 08/04/2014    Current Outpatient Medications:    albuterol (VENTOLIN HFA) 108 (90 Base) MCG/ACT inhaler, Inhale 2 puffs into the lungs every 6 (six) hours as needed., Disp: 18 g, Rfl: 3   amLODipine (NORVASC) 10 MG tablet, Take 1 tablet (10 mg total) by mouth daily for hypertension, Disp: 90 tablet, Rfl: 1   aspirin 81 MG EC tablet, Take 1 tablet (81 mg total) by mouth daily., Disp: 60 tablet, Rfl: 1   atorvastatin (LIPITOR) 40 MG tablet, Take 1 tablet (40 mg total) by mouth daily for antilipids, Disp: 90 tablet, Rfl: 1   ezetimibe (ZETIA) 10 MG tablet, Take 1 tablet (10 mg total) by mouth daily for antilipids, Disp: 90 tablet, Rfl: 1   isosorbide mononitrate (IMDUR) 30 MG 24 hr tablet, Take 2 tablets (60 mg total) by mouth daily for blood  pressure, Disp: 180 tablet, Rfl: 1   labetalol (NORMODYNE) 300 MG tablet, Take 1 tablet (300 mg total) by mouth 2 (two) times daily for beta blockers, Disp: 180 tablet, Rfl: 1   levothyroxine (SYNTHROID) 25 MCG tablet, Take 1 tablet (25 mcg total) by mouth daily for low thyroid hormone, Disp: 90 tablet, Rfl: 1   omeprazole (PRILOSEC) 20 MG capsule, Take 2 capsules (40 mg total) by mouth daily for GERD, Disp: 180 capsule, Rfl: 1   ondansetron (ZOFRAN) 8 MG tablet, Take 1 tablet (8 mg total) by mouth every 8 (eight) hours as needed for nausea, Disp: 42 tablet, Rfl: 0   polyethylene glycol (MIRALAX / GLYCOLAX) 17 g packet, Give 17 gram by mouth one time a day for constipation (Patient taking differently: Take 1 packet by mouth daily. As needed), Disp: 14 packet, Rfl: 0   sevelamer carbonate (RENVELA) 800 MG tablet, Take 3 tablets (2,400 mg total) by mouth 3 (three) times daily with meals and 1 tablet with snacks., Disp: 1080 tablet, Rfl: 3   Accu-Chek Softclix Lancets lancets, Use to check blood sugar 3 times daily. (Patient not taking: Reported on 10/15/2023), Disp: 100 each, Rfl: 6   benzonatate (TESSALON) 100 MG capsule, Take 1 capsule (100 mg total) by mouth 3 (three)  times daily as needed for cough. (Patient not taking: Reported on 10/08/2023), Disp: 21 capsule, Rfl: 0   Blood Glucose Monitoring Suppl (ACCU-CHEK GUIDE) w/Device KIT, Use to check blood sugar 3 times daily. (Patient not taking: Reported on 10/15/2023), Disp: 1 kit, Rfl: 0   glucose blood (ACCU-CHEK GUIDE) test strip, Use to check blood sugar 3 times daily. (Patient not taking: Reported on 10/15/2023), Disp: 100 each, Rfl: 6   Insulin Pen Needle 32G X 4 MM MISC, Use to inject insulin as directed. (Patient not taking: Reported on 10/15/2023), Disp: 100 each, Rfl: 4   Multiple Vitamin (MULTI-VITAMIN DAILY) TABS, Take 1 tablet by mouth at bedtime for supplementation (Patient not taking: Reported on 10/15/2023), Disp: 14 tablet, Rfl: 0    Zinc 220 (50 Zn) MG CAPS, Take 1 capsule by mouth daily for minerals, electrolytes (Patient not taking: Reported on 10/01/2023), Disp: 14 capsule, Rfl: 0 Allergies  Allergen Reactions   Hydralazine Other (See Comments)   Hydralazine Hcl Other (See Comments)    Hair loss   Hydrocodone Itching and Other (See Comments)    Upset stomach   Metformin And Related Nausea And Vomiting and Other (See Comments)    Stomach pains, also   Other Nausea Only and Other (See Comments)    Lettuce- Does not digest this!!   Plaquenil [Hydroxychloroquine Sulfate] Hives   Shellfish Allergy Nausea And Vomiting   Shellfish-Derived Products Nausea Only and Other (See Comments)    Caused an upset stomach   Shrimp (Diagnostic) Nausea Only and Other (See Comments)    Upset stomach    Sulfa Antibiotics Hives     Social History   Socioeconomic History   Marital status: Legally Separated    Spouse name: [name]   Number of children: 1   Years of education: some colle   Highest education level: Not on file  Occupational History   Occupation: Employed FT as Warden/ranger    Comment: [employer] , NA  Tobacco Use   Smoking status: Former    Current packs/day: 0.25    Average packs/day: 0.3 packs/day for 0.5 years (0.1 ttl pk-yrs)    Types: Cigarettes    Passive exposure: Never   Smokeless tobacco: Never   Tobacco comments:    Former smoker 11/03/21  Vaping Use   Vaping status: Never Used  Substance and Sexual Activity   Alcohol use: No   Drug use: No   Sexual activity: Not Currently    Birth control/protection: None  Other Topics Concern   Not on file  Social History Narrative   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    Social Determinants of Health   Financial Resource Strain: Low Risk  (10/22/2023)   Overall Financial Resource Strain (CARDIA)    Difficulty of Paying Living Expenses: Not very hard  Food Insecurity: No Food Insecurity (10/22/2023)   Hunger Vital  Sign    Worried About Running Out of Food in the Last Year: Never true    Ran Out of Food in the Last Year: Never true  Transportation Needs: No Transportation Needs (10/22/2023)   PRAPARE - Administrator, Civil Service (Medical): No    Lack of Transportation (Non-Medical): No  Physical Activity: Inactive (10/22/2023)   Exercise Vital Sign    Days of Exercise per Week: 0 days    Minutes of Exercise per Session: 0 min  Stress: No Stress Concern Present (10/22/2023)   [employer] of Occupational Health -  Occupational Stress Questionnaire    Feeling of Stress : Not at all  Social Connections: Unknown (10/22/2023)   Social Connection and Isolation Panel [NHANES]    Frequency of Communication with Friends and Family: More than three times a week    Frequency of Social Gatherings with Friends and Family: More than three times a week    Attends Religious Services: Not on file    Active Member of Clubs or Organizations: Yes    Attends Banker Meetings: Not on file    Marital Status: Married  Intimate Partner Violence: Not At Risk (10/22/2023)   Humiliation, Afraid, Rape, and Kick questionnaire    Fear of Current or Ex-Partner: No    Emotionally Abused: No    Physically Abused: No    Sexually Abused: No    Physical Exam      Future Appointments  Date Time Provider Department Center  11/21/2023 11:00 AM [facility] HS V[facility] 6 [facility]  11/21/2023 12:00 PM Victorino Sparrow, MD [facility]

## 2023-10-30 ENCOUNTER — Telehealth (HOSPITAL_COMMUNITY): Payer: Self-pay | Admitting: Cardiology

## 2023-10-30 ENCOUNTER — Other Ambulatory Visit: Payer: Self-pay

## 2023-10-30 NOTE — Telephone Encounter (Signed)
-----   Message from CMA [name] S sent at 10/29/2023  5:48 PM EST ----- Regarding: Elevated Blood Pressures Today's visit finds Nancy [name]'s BP elevated.  11/12 BP 160/90 HR 82 Compliant with all meds.  Did not finish last 30 min of her dialysis today.  11/5 BP 158/76 HR 85 Missed 2 doses of her Isosorbide  10/29 BP 150/80 HR 74 Compliant w/ all meds  10/15 BP 138/80 HR 97 Compliant w/ all meds.  She denies chest pain or SOB.  Just wanted to make someone aware and please advise of any changes. Vernia Buff, EMT-Paramedic [phone] 10/29/2023

## 2023-10-31 ENCOUNTER — Ambulatory Visit: Payer: Medicare Other | Admitting: Vascular Surgery

## 2023-10-31 ENCOUNTER — Encounter (HOSPITAL_COMMUNITY): Payer: Medicare Other

## 2023-11-05 ENCOUNTER — Other Ambulatory Visit (HOSPITAL_COMMUNITY): Payer: Self-pay | Admitting: Emergency Medicine

## 2023-11-05 NOTE — Progress Notes (Unsigned)
Paramedicine Encounter    Patient ID: Nancy [name], female    DOB: 12-Sep-1961, 62 y.o.   MRN: [id number]   Complaints***  Assessment***  Compliance with meds***  Pill box filled***  Refills needed***  Meds changes since last visit***    Social changes***   There were no vitals taken for this visit. Weight yesterday-*** Last visit weight-***  ACTION: {Paramed Action:[phone]}  Beatrix Shipper, EMT-Paramedic [phone] 11/05/23  Patient Care Team: Marcine Matar, MD as PCP - General (Internal Medicine) Runell Gess, MD as PCP - Cardiology (Cardiology) Laurey Morale, MD as PCP - Advanced Heart Failure (Cardiology) Center, [facility]  Patient Active Problem List   Diagnosis Date Noted  . History of CVA (cerebrovascular accident) 03/22/2023  . Community acquired pneumonia 03/22/2023  . Acute respiratory failure with hypoxia (HCC) 11/04/2022  . Pain, unspecified 08/15/2022  . Complication of vascular dialysis catheter 08/07/2022  . Chest pain 05/30/2022  . Fluid overload 05/30/2022  . Acute pulmonary edema (HCC) 05/30/2022  . Iron deficiency anemia, unspecified 04/26/2022  . Moderate protein-calorie malnutrition (HCC) 04/18/2022  . Nausea 04/10/2022  . Hyponatremia 04/04/2022  . Prolonged QT interval 04/04/2022  . Anaphylactic shock, unspecified, initial encounter 02/23/2022  . Coagulation defect, unspecified (HCC) 02/23/2022  . Other allergy, initial encounter 02/23/2022  . Pressure injury of skin 02/19/2022  . Anemia in chronic kidney disease 02/16/2022  . 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 Hyperlipidemia, unspecified 02/16/2022  . Hypertensive heart and chronic kidney disease with heart failure and with stage 5 chronic kidney disease, or end stage renal disease (HCC) 02/16/2022  . Personal history of nicotine dependence 02/16/2022  . Personal history of transient ischemic attack (TIA), and cerebral infarction without  residual deficits 02/16/2022  . Secondary hyperparathyroidism of renal origin (HCC) 02/16/2022  . Type 2 diabetes mellitus with diabetic peripheral angiopathy without gangrene (HCC) 02/16/2022  . Renal anasarca 02/14/2022  . ESRD on dialysis (HCC) 02/14/2022  . Hyperkalemia 02/14/2022  . S/P arteriovenous (AV) fistula creation 12/22/2021  . Critical limb ischemia of both lower extremities (HCC) 11/22/2021  . Subacute osteomyelitis, right ankle and foot (HCC)   . Peripheral artery disease (HCC) 10/27/2021  . Acute exacerbation of congestive heart failure (HCC) 10/24/2021  . Acute cardiogenic pulmonary edema (HCC) 10/06/2021  . CAD (coronary artery disease) 10/06/2021  . Goals of care, counseling/discussion   . Gangrene of left foot (HCC)   . Cellulitis of left foot   . Diabetic foot ulcers (HCC) 09/04/2021  . CHF (congestive heart failure) (HCC) 07/07/2021  . Acute postoperative anemia due to expected blood loss superimposed on anemia of chronic renal insufficiency 06/30/2021  . CKD (chronic kidney disease), stage IV (HCC) 06/11/2021  . Chronic diastolic CHF (congestive heart failure) (HCC) 06/10/2021  . Abnormal nuclear stress test   . Dyspnea on exertion 03/07/2021  . Orthopnea 02/25/2021  . History of cerebrovascular accident (CVA) with residual deficit 05/06/2020  . Diabetic peripheral neuropathy (HCC) 04/26/2020  . AKI (acute kidney injury) (HCC) 04/20/2020  . Generalized weakness 04/20/2020  . Dehydration 04/20/2020  . Generalized abdominal pain   . Pancreatitis, acute 02/29/2020  . Cerebrovascular accident (CVA) (HCC) 11/08/2019  . Stenosis of right carotid artery 11/08/2019  . Mixed diabetic hyperlipidemia associated with type 2 diabetes mellitus (HCC) 11/08/2019  . Cortical age-related cataract of both eyes 08/30/2019  . Gait abnormality 08/30/2019  . Statin declined 08/30/2019  . Gastroesophageal reflux disease without esophagitis 04/18/2018  . 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  Insulin dependent type 2 diabetes mellitus (HCC) 10/29/2017  . History of macular degeneration 10/29/2017  . Chronic cervical pain 10/29/2016  . Myalgia 09/14/2016  . Insomnia 09/14/2016  . Tinea pedis of both feet 08/20/2016  . Macular degeneration 08/17/2016  . Low back pain 09/21/2014  . Hypertensive urgency 09/21/2014  . Diabetic gastroparesis associated with type 2 diabetes mellitus (HCC) 09/14/2014  . HTN (hypertension) 09/08/2014  . Thyroid nodule 08/17/2014  . Morbid obesity (HCC) 08/17/2014  . Hypokalemia 08/06/2014  . Type II diabetes mellitus with neurological manifestations, uncontrolled 08/04/2014    Current Outpatient Medications:  .  albuterol (VENTOLIN HFA) 108 (90 Base) MCG/ACT inhaler, Inhale 2 puffs into the lungs every 6 (six) hours as needed., Disp: 18 g, Rfl: 3 .  amLODipine (NORVASC) 10 MG tablet, Take 1 tablet (10 mg total) by mouth daily for hypertension, Disp: 90 tablet, Rfl: 1 .  aspirin 81 MG EC tablet, Take 1 tablet (81 mg total) by mouth daily., Disp: 60 tablet, Rfl: 1 .  atorvastatin (LIPITOR) 40 MG tablet, Take 1 tablet (40 mg total) by mouth daily for antilipids, Disp: 90 tablet, Rfl: 1 .  ezetimibe (ZETIA) 10 MG tablet, Take 1 tablet (10 mg total) by mouth daily for antilipids, Disp: 90 tablet, Rfl: 1 .  labetalol (NORMODYNE) 300 MG tablet, Take 1 tablet (300 mg total) by mouth 2 (two) times daily for beta blockers, Disp: 180 tablet, Rfl: 1 .  levothyroxine (SYNTHROID) 25 MCG tablet, Take 1 tablet (25 mcg total) by mouth daily for low thyroid hormone, Disp: 90 tablet, Rfl: 1 .  omeprazole (PRILOSEC) 20 MG capsule, Take 2 capsules (40 mg total) by mouth daily for GERD, Disp: 180 capsule, Rfl: 1 .  ondansetron (ZOFRAN) 8 MG tablet, Take 1 tablet (8 mg total) by mouth every 8 (eight) hours as needed for nausea, Disp: 42 tablet, Rfl: 0 .  polyethylene glycol (MIRALAX / GLYCOLAX) 17 g packet, Give 17 gram by mouth one time a day for constipation (Patient  taking differently: Take 1 packet by mouth daily. As needed), Disp: 14 packet, Rfl: 0 .  sevelamer carbonate (RENVELA) 800 MG tablet, Take 3 tablets (2,400 mg total) by mouth 3 (three) times daily with meals and 1 tablet with snacks., Disp: 1080 tablet, Rfl: 3 .  Accu-Chek Softclix Lancets lancets, Use to check blood sugar 3 times daily. (Patient not taking: Reported on 10/15/2023), Disp: 100 each, Rfl: 6 .  benzonatate (TESSALON) 100 MG capsule, Take 1 capsule (100 mg total) by mouth 3 (three) times daily as needed for cough. (Patient not taking: Reported on 10/08/2023), Disp: 21 capsule, Rfl: 0 .  Blood Glucose Monitoring Suppl (ACCU-CHEK GUIDE) w/Device KIT, Use to check blood sugar 3 times daily. (Patient not taking: Reported on 10/15/2023), Disp: 1 kit, Rfl: 0 .  glucose blood (ACCU-CHEK GUIDE) test strip, Use to check blood sugar 3 times daily. (Patient not taking: Reported on 10/15/2023), Disp: 100 each, Rfl: 6 .  Insulin Pen Needle 32G X 4 MM MISC, Use to inject insulin as directed. (Patient not taking: Reported on 10/15/2023), Disp: 100 each, Rfl: 4 .  isosorbide mononitrate (IMDUR) 30 MG 24 hr tablet, Take 2 tablets (60 mg total) by mouth daily for blood pressure, Disp: 180 tablet, Rfl: 1 .  Multiple Vitamin (MULTI-VITAMIN DAILY) TABS, Take 1 tablet by mouth at bedtime for supplementation (Patient not taking: Reported on 10/15/2023), Disp: 14 tablet, Rfl: 0 .  Zinc 220 (50 Zn) MG CAPS, Take 1 capsule  by mouth daily for minerals, electrolytes (Patient not taking: Reported on 10/01/2023), Disp: 14 capsule, Rfl: 0 Allergies  Allergen Reactions  . Hydralazine Other (See Comments)  . Hydralazine Hcl Other (See Comments)    Hair loss  . Hydrocodone Itching and Other (See Comments)    Upset stomach  . Metformin And Related Nausea And Vomiting and Other (See Comments)    Stomach pains, also  . Other Nausea Only and Other (See Comments)    Lettuce- Does not digest this!!  . Plaquenil  [Hydroxychloroquine Sulfate] Hives  . Shellfish Allergy Nausea And Vomiting  . Shellfish-Derived Products Nausea Only and Other (See Comments)    Caused an upset stomach  . Shrimp (Diagnostic) Nausea Only and Other (See Comments)    Upset stomach   . Sulfa Antibiotics Hives     Social History   Socioeconomic History  . Marital status: Legally Separated    Spouse name: [name]  . Number of children: 1  . Years of education: some colle  . Highest education level: Not on file  Occupational History  . Occupation: Employed FT as Warden/ranger    Comment: [employer] , NA  Tobacco Use  . Smoking status: Former    Current packs/day: 0.25    Average packs/day: 0.3 packs/day for 0.5 years (0.1 ttl pk-yrs)    Types: Cigarettes    Passive exposure: Never  . Smokeless tobacco: Never  . Tobacco comments:    Former smoker 11/03/21  Vaping Use  . Vaping status: Never Used  Substance and Sexual Activity  . Alcohol use: No  . Drug use: No  . Sexual activity: Not Currently    Birth control/protection: None  Other Topics Concern  . 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 Difficulty of Paying Living Expenses: Not very hard  Food Insecurity: No Food Insecurity (10/22/2023)   Hunger Vital Sign   . Worried About Programme researcher, broadcasting/film/video in the Last Year: Never true   . Ran Out of Food in the Last Year: Never true  Transportation Needs: No Transportation Needs (10/22/2023)   PRAPARE - Transportation   . Lack of Transportation (Medical): No   . Lack of Transportation (Non-Medical): No  Physical Activity: Inactive (10/22/2023)   Exercise Vital Sign   . Days of Exercise per Week: 0 days   . Minutes of Exercise per Session: 0 min  Stress: No Stress Concern Present (10/22/2023)   [employer] of Occupational Health - Occupational Stress Questionnaire   . Feeling of Stress : Not at all  Social Connections: Unknown (10/22/2023)   Social Connection and Isolation Panel [NHANES]   . Frequency of Communication with Friends and Family: More than three times a week   . Frequency of Social Gatherings with Friends and Family: More than three times a week   . Attends Religious Services: Not on file   . Active Member of Clubs or Organizations: Yes   . Attends Banker Meetings: Not on file   . Marital Status: Married  Catering manager Violence: Not At Risk (10/22/2023)   Humiliation, Afraid, Rape, and Kick questionnaire   . Fear of Current or Ex-Partner: No   . Emotionally Abused: No   . Physically Abused: No   . Sexually Abused:  No    Physical Exam      Future Appointments  Date Time Provider Department Center  11/21/2023 11:00 AM [facility]  11/21/2023 12:00 PM Victorino Sparrow, MD [facility]

## 2023-11-12 ENCOUNTER — Telehealth: Payer: Self-pay

## 2023-11-12 NOTE — Telephone Encounter (Signed)
Called but no answer. LVM to call back.  

## 2023-11-12 NOTE — Telephone Encounter (Signed)
Copied from CRM [id number]. Topic: General - Inquiry >> Nov 12, 2023  9:55 AM Patsy Lager T wrote: Reason for CRM: [name] patients spouse called requesting that provider call him back about [employer] [facility]. He did not give anymore info. Please f/u with patient

## 2023-11-13 ENCOUNTER — Other Ambulatory Visit (HOSPITAL_COMMUNITY): Payer: Self-pay | Admitting: Emergency Medicine

## 2023-11-13 NOTE — Telephone Encounter (Addendum)
Spoke with patient and patient's husband for clarification. They voiced that she was told by [employer] the patient needs a new prescription for her oxygen and It needs to sent be in 7 days. Spoke with Brandy at [employer] , She said the patient's oxygen was order for short -term use and she will need supporting documention and new order sent over. Advised the PCP in not in the office and due to the holiday can the patient's deadline be extended. Gearldine Bienenstock is going to forward to the department that can grant my request.

## 2023-11-13 NOTE — Progress Notes (Signed)
Paramedicine Encounter    Patient ID: Nancy [name], female    DOB: 1961/10/06, 62 y.o.   MRN: [id number]   Complaints NONE  Assessment A&O x [name], skin W&D w/ good color.  Denies chest pain or SOB. Lung sounds clear throughout and no edema noted.  No dizziness.  Bowel movements normal.    Compliance with meds YES  Pill box filled x 1 week  Refills needed   Meds changes since last visit none    Social changes NONE   BP (!) 160/100 (BP Location: Right Arm, Patient Position: Sitting, Cuff Size: Normal)   Pulse 78   Resp 14   SpO2 100%  Weight yesterday- not taken Last visit weight-224lb  ATF Nancy [name], skin W&D w good color.  Pt. Denies chest pain or SOB.  Lung sounds clear throughout and no peripheral edema noted.  Compliant with all meds and missed no doses   She is hypertensive today.  She denies headache, dizziness or visual disturbance.  She will have her next dialysis treatment on Friday because of the holiday.  Advised pt  the clinic will be closed Thursday an d Friday.  Advised her should she have an emergency to call 911.  Next home visit  ACTION: Home visit completed  Bethanie Dicker [phone] 11/13/23  Patient Care Team: Marcine Matar, MD as PCP - General (Internal Medicine) Runell Gess, MD as PCP - Cardiology (Cardiology) Laurey Morale, MD as PCP - Advanced Heart Failure (Cardiology) Center, [facility]  Patient Active Problem List   Diagnosis Date Noted  . History of CVA (cerebrovascular accident) 03/22/2023  . Community acquired pneumonia 03/22/2023  . Acute respiratory failure with hypoxia (HCC) 11/04/2022  . Pain, unspecified 08/15/2022  . Complication of vascular dialysis catheter 08/07/2022  . Chest pain 05/30/2022  . Fluid overload 05/30/2022  . Acute pulmonary edema (HCC) 05/30/2022  . Iron deficiency anemia, unspecified 04/26/2022  . Moderate protein-calorie malnutrition (HCC) 04/18/2022  . Nausea  04/10/2022  . Hyponatremia 04/04/2022  . Prolonged QT interval 04/04/2022  . Anaphylactic shock, unspecified, initial encounter 02/23/2022  . Coagulation defect, unspecified (HCC) 02/23/2022  . Other allergy, initial encounter 02/23/2022  . Pressure injury of skin 02/19/2022  . Anemia in chronic kidney disease 02/16/2022  . 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 Hyperlipidemia, unspecified 02/16/2022  . Hypertensive heart and chronic kidney disease with heart failure and with stage 5 chronic kidney disease, or end stage renal disease (HCC) 02/16/2022  . Personal history of nicotine dependence 02/16/2022  . Personal history of transient ischemic attack (TIA), and cerebral infarction without residual deficits 02/16/2022  . Secondary hyperparathyroidism of renal origin (HCC) 02/16/2022  . Type 2 diabetes mellitus with diabetic peripheral angiopathy without gangrene (HCC) 02/16/2022  . Renal anasarca 02/14/2022  . ESRD on dialysis (HCC) 02/14/2022  . Hyperkalemia 02/14/2022  . S/P arteriovenous (AV) fistula creation 12/22/2021  . Critical limb ischemia of both lower extremities (HCC) 11/22/2021  . Subacute osteomyelitis, right ankle and foot (HCC)   . Peripheral artery disease (HCC) 10/27/2021  . Acute exacerbation of congestive heart failure (HCC) 10/24/2021  . Acute cardiogenic pulmonary edema (HCC) 10/06/2021  . CAD (coronary artery disease) 10/06/2021  . Goals of care, counseling/discussion   . Gangrene of left foot (HCC)   . Cellulitis of left foot   . Diabetic foot ulcers (HCC) 09/04/2021  . CHF (congestive heart failure) (HCC) 07/07/2021  . Acute postoperative anemia due to  expected blood loss superimposed on anemia of chronic renal insufficiency 06/30/2021  . CKD (chronic kidney disease), stage IV (HCC) 06/11/2021  . Chronic diastolic CHF (congestive heart failure) (HCC) 06/10/2021  . Abnormal nuclear stress test   . Dyspnea on exertion 03/07/2021  . Orthopnea  02/25/2021  . History of cerebrovascular accident (CVA) with residual deficit 05/06/2020  . Diabetic peripheral neuropathy (HCC) 04/26/2020  . AKI (acute kidney injury) (HCC) 04/20/2020  . Generalized weakness 04/20/2020  . Dehydration 04/20/2020  . Generalized abdominal pain   . Pancreatitis, acute 02/29/2020  . Cerebrovascular accident (CVA) (HCC) 11/08/2019  . Stenosis of right carotid artery 11/08/2019  . Mixed diabetic hyperlipidemia associated with type 2 diabetes mellitus (HCC) 11/08/2019  . Cortical age-related cataract of both eyes 08/30/2019  . Gait abnormality 08/30/2019  . Statin declined 08/30/2019  . Gastroesophageal reflux disease without esophagitis 04/18/2018  . 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 Insulin dependent type 2 diabetes mellitus (HCC) 10/29/2017  . History of macular degeneration 10/29/2017  . Chronic cervical pain 10/29/2016  . Myalgia 09/14/2016  . Insomnia 09/14/2016  . Tinea pedis of both feet 08/20/2016  . Macular degeneration 08/17/2016  . Low back pain 09/21/2014  . Hypertensive urgency 09/21/2014  . Diabetic gastroparesis associated with type 2 diabetes mellitus (HCC) 09/14/2014  . HTN (hypertension) 09/08/2014  . Thyroid nodule 08/17/2014  . Morbid obesity (HCC) 08/17/2014  . Hypokalemia 08/06/2014  . Type II diabetes mellitus with neurological manifestations, uncontrolled 08/04/2014    Current Outpatient Medications:  .  albuterol (VENTOLIN HFA) 108 (90 Base) MCG/ACT inhaler, Inhale 2 puffs into the lungs every 6 (six) hours as needed., Disp: 18 g, Rfl: 3 .  amLODipine (NORVASC) 10 MG tablet, Take 1 tablet (10 mg total) by mouth daily for hypertension, Disp: 90 tablet, Rfl: 1 .  aspirin 81 MG EC tablet, Take 1 tablet (81 mg total) by mouth daily., Disp: 60 tablet, Rfl: 1 .  atorvastatin (LIPITOR) 40 MG tablet, Take 1 tablet (40 mg total) by mouth daily for antilipids, Disp: 90 tablet, Rfl: 1 .  ezetimibe (ZETIA) 10 MG tablet, Take 1 tablet (10 mg  total) by mouth daily for antilipids, Disp: 90 tablet, Rfl: 1 .  isosorbide mononitrate (IMDUR) 30 MG 24 hr tablet, Take 2 tablets (60 mg total) by mouth daily for blood pressure, Disp: 180 tablet, Rfl: 1 .  labetalol (NORMODYNE) 300 MG tablet, Take 1 tablet (300 mg total) by mouth 2 (two) times daily for beta blockers, Disp: 180 tablet, Rfl: 1 .  levothyroxine (SYNTHROID) 25 MCG tablet, Take 1 tablet (25 mcg total) by mouth daily for low thyroid hormone, Disp: 90 tablet, Rfl: 1 .  omeprazole (PRILOSEC) 20 MG capsule, Take 2 capsules (40 mg total) by mouth daily for GERD, Disp: 180 capsule, Rfl: 1 .  ondansetron (ZOFRAN) 8 MG tablet, Take 1 tablet (8 mg total) by mouth every 8 (eight) hours as needed for nausea, Disp: 42 tablet, Rfl: 0 .  Accu-Chek Softclix Lancets lancets, Use to check blood sugar 3 times daily. (Patient not taking: Reported on 10/15/2023), Disp: 100 each, Rfl: 6 .  benzonatate (TESSALON) 100 MG capsule, Take 1 capsule (100 mg total) by mouth 3 (three) times daily as needed for cough. (Patient not taking: Reported on 10/08/2023), Disp: 21 capsule, Rfl: 0 .  Blood Glucose Monitoring Suppl (ACCU-CHEK GUIDE) w/Device KIT, Use to check blood sugar 3 times daily. (Patient not taking: Reported on 10/15/2023), Disp: 1 kit, Rfl: 0 .  glucose blood (  ACCU-CHEK GUIDE) test strip, Use to check blood sugar 3 times daily. (Patient not taking: Reported on 10/15/2023), Disp: 100 each, Rfl: 6 .  Insulin Pen Needle 32G X [name] MM MISC, Use to inject insulin as directed. (Patient not taking: Reported on 10/15/2023), Disp: 100 each, Rfl: [name] .  Multiple Vitamin (MULTI-VITAMIN DAILY) TABS, Take 1 tablet by mouth at bedtime for supplementation (Patient not taking: Reported on 10/15/2023), Disp: 14 tablet, Rfl: 0 .  polyethylene glycol (MIRALAX / GLYCOLAX) 17 g packet, Give 17 gram by mouth one time a day for constipation (Patient taking differently: Take 1 packet by mouth daily. As needed), Disp: 14 packet, Rfl:  0 .  sevelamer carbonate (RENVELA) 800 MG tablet, Take 3 tablets (2,400 mg total) by mouth 3 (three) times daily with meals and 1 tablet with snacks., Disp: 1080 tablet, Rfl: 3 .  Zinc 220 (50 Zn) MG CAPS, Take 1 capsule by mouth daily for minerals, electrolytes (Patient not taking: Reported on 10/01/2023), Disp: 14 capsule, Rfl: 0 Allergies  Allergen Reactions  . Hydralazine Other (See Comments)  . Hydralazine Hcl Other (See Comments)    Hair loss  . Hydrocodone Itching and Other (See Comments)    Upset stomach  . Metformin And Related Nausea And Vomiting and Other (See Comments)    Stomach pains, also  . Other Nausea Only and Other (See Comments)    Lettuce- Does not digest this!!  . Plaquenil [Hydroxychloroquine Sulfate] Hives  . Shellfish Allergy Nausea And Vomiting  . Shellfish-Derived Products Nausea Only and Other (See Comments)    Caused an upset stomach  . Shrimp (Diagnostic) Nausea Only and Other (See Comments)    Upset stomach   . Sulfa Antibiotics Hives     Social History   Socioeconomic History  . Marital status: Legally Separated    Spouse name: [name]  . Number of children: 1  . Years of education: some colle  . Highest education level: Not on file  Occupational History  . Occupation: Employed FT as Warden/ranger    Comment: [employer] , NA  Tobacco Use  . Smoking status: Former    Current packs/day: 0.25    Average packs/day: 0.3 packs/day for 0.5 years (0.1 ttl pk-yrs)    Types: Cigarettes    Passive exposure: Never  . Smokeless tobacco: Never  . Tobacco comments:    Former smoker 11/03/21  Vaping Use  . Vaping status: Never Used  Substance and Sexual Activity  . Alcohol use: No  . Drug use: No  . Sexual activity: Not Currently    Birth control/protection: None  Other Topics Concern  . 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 Difficulty of Paying Living Expenses: Not very hard  Food Insecurity: No Food Insecurity (10/22/2023)   Hunger Vital Sign   . Worried About Programme researcher, broadcasting/film/video in the Last Year: Never true   . Ran Out of Food in the Last Year: Never true  Transportation Needs: No Transportation Needs (10/22/2023)   PRAPARE - Transportation   . Lack of Transportation (Medical): No   . Lack of Transportation (Non-Medical): No  Physical Activity: Inactive (10/22/2023)   Exercise Vital Sign   . Days of Exercise per Week: 0 days   . Minutes of  Exercise per Session: 0 min  Stress: No Stress Concern Present (10/22/2023)   [employer] of Occupational Health - Occupational Stress Questionnaire   . Feeling of Stress : Not at all  Social Connections: Unknown (10/22/2023)   Social Connection and Isolation Panel [NHANES]   . Frequency of Communication with Friends and Family: More than three times a week   . Frequency of Social Gatherings with Friends and Family: More than three times a week   . Attends Religious Services: Not on file   . Active Member of Clubs or Organizations: Yes   . Attends Banker Meetings: Not on file   . Marital Status: Married  Catering manager Violence: Not At Risk (10/22/2023)   Humiliation, Afraid, Rape, and Kick questionnaire   . Fear of Current or Ex-Partner: No   . Emotionally Abused: No   . Physically Abused: No   . Sexually Abused: No    Physical Exam      Future Appointments  Date Time Provider Department Center  11/21/2023 11:00 AM [facility]  11/21/2023 12:00 PM Victorino Sparrow, MD [facility]

## 2023-11-13 NOTE — Telephone Encounter (Signed)
[name]'s patient's spouse called requesting that the provider call him back about [employer] [facility]. Stated, [employer] [facility] called him because he had requested to the social worker, an oxygen concentrator, and a Geophysicist/field seismologist for the pt.   [facility] is trying to contact Dr. Laural Benes for her to approve the oxygen concentrator and Geophysicist/field seismologist. Please advise.

## 2023-11-13 NOTE — Telephone Encounter (Signed)
I called patient's husband and he explained that his wife really needs her O2.  She has a room concentrator as well as a POC and she needs orders to continue to receive the O2. I explained that Dr Laural Benes has not seen her since 10/2021 and will need to see her to be able to re-certify the home O2. He said he understood and I could schedule her on a T/TH. I was able to schedule her 12/03/2023 and he said he would be there with her.  He said she is ambulating with her 2 prosthetic legs and a walker ( when needed).  She will bring the walker with her to the appointment.

## 2023-11-18 ENCOUNTER — Other Ambulatory Visit: Payer: Self-pay

## 2023-11-18 ENCOUNTER — Other Ambulatory Visit (HOSPITAL_COMMUNITY): Payer: Self-pay

## 2023-11-19 ENCOUNTER — Other Ambulatory Visit: Payer: Self-pay

## 2023-11-19 ENCOUNTER — Other Ambulatory Visit (HOSPITAL_COMMUNITY): Payer: Self-pay | Admitting: Emergency Medicine

## 2023-11-19 ENCOUNTER — Other Ambulatory Visit (HOSPITAL_COMMUNITY): Payer: Self-pay

## 2023-11-19 NOTE — Progress Notes (Unsigned)
Paramedicine Encounter    Patient ID: Nancy [name], female    DOB: November 30, 1961, 62 y.o.   MRN: [id number]   Complaints NONE  Assessment A&O x 4, skin W&D w/ good color.  Denies chest pain or SOB.  Lung sounds clear and equal throughout.  No edema noted.  Compliance with meds Missed 1 amlodopine dose  Pill box filled x 1 week  Refills needed none  Meds changes since last visit NONE    Social changes NONE   BP (!) 180/100 (BP Location: Left Arm, Patient Position: Sitting, Cuff Size: Normal)   Pulse 81   Wt 228 lb 12.8 oz (103.8 kg)   SpO2 96%   BMI 40.53 kg/m  Weight yesterday- not taken Last visit weight-224lb  Pt advises she is feeling well today despite BP being elevated.  She has missed 1 dose of her Amlodopine for the past week.  She denies headache or dizziness or weakness.  No chest pain or SOB noted.  Lung sounds clear bilat.  Pt. Admits to making some poor nutritional choices over thanksgiving eating ham and dressing.   Med box reconciled x 1 week.  Next home visit scheduled 12/10 @ 12:00.   ACTION: Home visit completed  Bethanie Dicker [phone] 11/19/23  Patient Care Team: Marcine Matar, MD as PCP - General (Internal Medicine) Runell Gess, MD as PCP - Cardiology (Cardiology) Laurey Morale, MD as PCP - Advanced Heart Failure (Cardiology) Center, [facility]  Patient Active Problem List   Diagnosis Date Noted   History of CVA (cerebrovascular accident) 03/22/2023   Community acquired pneumonia 03/22/2023   Acute respiratory failure with hypoxia (HCC) 11/04/2022   Pain, unspecified 08/15/2022   Complication of vascular dialysis catheter 08/07/2022   Chest pain 05/30/2022   Fluid overload 05/30/2022   Acute pulmonary edema (HCC) 05/30/2022   Iron deficiency anemia, unspecified 04/26/2022   Moderate protein-calorie malnutrition (HCC) 04/18/2022   Nausea 04/10/2022   Hyponatremia 04/04/2022   Prolonged QT interval  04/04/2022   Anaphylactic shock, unspecified, initial encounter 02/23/2022   Coagulation defect, unspecified (HCC) 02/23/2022   Other allergy, initial encounter 02/23/2022   Pressure injury of skin 02/19/2022   Anemia in chronic kidney disease 02/16/2022   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) 02/16/2022   Hyperlipidemia, unspecified 02/16/2022   Hypertensive heart and chronic kidney disease with heart failure and with stage 5 chronic kidney disease, or end stage renal disease (HCC) 02/16/2022   Personal history of nicotine dependence 02/16/2022   Personal history of transient ischemic attack (TIA), and cerebral infarction without residual deficits 02/16/2022   Secondary hyperparathyroidism of renal origin (HCC) 02/16/2022   Type 2 diabetes mellitus with diabetic peripheral angiopathy without gangrene (HCC) 02/16/2022   Renal anasarca 02/14/2022   ESRD on dialysis (HCC) 02/14/2022   Hyperkalemia 02/14/2022   S/P arteriovenous (AV) fistula creation 12/22/2021   Critical limb ischemia of both lower extremities (HCC) 11/22/2021   Subacute osteomyelitis, right ankle and foot (HCC)    Peripheral artery disease (HCC) 10/27/2021   Acute exacerbation of congestive heart failure (HCC) 10/24/2021   Acute cardiogenic pulmonary edema (HCC) 10/06/2021   CAD (coronary artery disease) 10/06/2021   Goals of care, counseling/discussion    Gangrene of left foot (HCC)    Cellulitis of left foot    Diabetic foot ulcers (HCC) 09/04/2021   CHF (congestive heart failure) (HCC) 07/07/2021   Acute postoperative anemia due to expected blood loss superimposed on anemia of chronic renal insufficiency  06/30/2021   CKD (chronic kidney disease), stage IV (HCC) 06/11/2021   Chronic diastolic CHF (congestive heart failure) (HCC) 06/10/2021   Abnormal nuclear stress test    Dyspnea on exertion 03/07/2021   Orthopnea 02/25/2021   History of cerebrovascular accident (CVA) with residual deficit 05/06/2020   Diabetic  peripheral neuropathy (HCC) 04/26/2020   AKI (acute kidney injury) (HCC) 04/20/2020   Generalized weakness 04/20/2020   Dehydration 04/20/2020   Generalized abdominal pain    Pancreatitis, acute 02/29/2020   Cerebrovascular accident (CVA) (HCC) 11/08/2019   Stenosis of right carotid artery 11/08/2019   Mixed diabetic hyperlipidemia associated with type 2 diabetes mellitus (HCC) 11/08/2019   Cortical age-related cataract of both eyes 08/30/2019   Gait abnormality 08/30/2019   Statin declined 08/30/2019   Gastroesophageal reflux disease without esophagitis 04/18/2018   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   Insulin dependent type 2 diabetes mellitus (HCC) 10/29/2017   History of macular degeneration 10/29/2017   Chronic cervical pain 10/29/2016   Myalgia 09/14/2016   Insomnia 09/14/2016   Tinea pedis of both feet 08/20/2016   Macular degeneration 08/17/2016   Low back pain 09/21/2014   Hypertensive urgency 09/21/2014   Diabetic gastroparesis associated with type 2 diabetes mellitus (HCC) 09/14/2014   HTN (hypertension) 09/08/2014   Thyroid nodule 08/17/2014   Morbid obesity (HCC) 08/17/2014   Hypokalemia 08/06/2014   Type II diabetes mellitus with neurological manifestations, uncontrolled 08/04/2014    Current Outpatient Medications:    albuterol (VENTOLIN HFA) 108 (90 Base) MCG/ACT inhaler, Inhale 2 puffs into the lungs every 6 (six) hours as needed., Disp: 18 g, Rfl: 3   amLODipine (NORVASC) 10 MG tablet, Take 1 tablet (10 mg total) by mouth daily for hypertension, Disp: 90 tablet, Rfl: 1   aspirin 81 MG EC tablet, Take 1 tablet (81 mg total) by mouth daily., Disp: 60 tablet, Rfl: 1   atorvastatin (LIPITOR) 40 MG tablet, Take 1 tablet (40 mg total) by mouth daily for antilipids, Disp: 90 tablet, Rfl: 1   ezetimibe (ZETIA) 10 MG tablet, Take 1 tablet (10 mg total) by mouth daily for antilipids, Disp: 90 tablet, Rfl: 1   isosorbide mononitrate (IMDUR) 30 MG 24 hr tablet, Take 2 tablets (60 mg  total) by mouth daily for blood pressure, Disp: 180 tablet, Rfl: 1   labetalol (NORMODYNE) 300 MG tablet, Take 1 tablet (300 mg total) by mouth 2 (two) times daily for beta blockers, Disp: 180 tablet, Rfl: 1   omeprazole (PRILOSEC) 20 MG capsule, Take 2 capsules (40 mg total) by mouth daily for GERD, Disp: 180 capsule, Rfl: 1   ondansetron (ZOFRAN) 8 MG tablet, Take 1 tablet (8 mg total) by mouth every 8 (eight) hours as needed for nausea, Disp: 42 tablet, Rfl: 0   polyethylene glycol (MIRALAX / GLYCOLAX) 17 g packet, Give 17 gram by mouth one time a day for constipation (Patient taking differently: Take 1 packet by mouth daily. As needed), Disp: 14 packet, Rfl: 0   sevelamer carbonate (RENVELA) 800 MG tablet, Take 3 tablets (2,400 mg total) by mouth 3 (three) times daily with meals and 1 tablet with snacks., Disp: 1080 tablet, Rfl: 3   Accu-Chek Softclix Lancets lancets, Use to check blood sugar 3 times daily. (Patient not taking: Reported on 11/19/2023), Disp: 100 each, Rfl: 6   benzonatate (TESSALON) 100 MG capsule, Take 1 capsule (100 mg total) by mouth 3 (three) times daily as needed for cough. (Patient not taking: Reported on 10/08/2023), Disp: 21 capsule, Rfl:  0   Blood Glucose Monitoring Suppl (ACCU-CHEK GUIDE) w/Device KIT, Use to check blood sugar 3 times daily. (Patient not taking: Reported on 10/15/2023), Disp: 1 kit, Rfl: 0   glucose blood (ACCU-CHEK GUIDE) test strip, Use to check blood sugar 3 times daily. (Patient not taking: Reported on 10/15/2023), Disp: 100 each, Rfl: 6   Insulin Pen Needle 32G X 4 MM MISC, Use to inject insulin as directed. (Patient not taking: Reported on 10/15/2023), Disp: 100 each, Rfl: 4   levothyroxine (SYNTHROID) 25 MCG tablet, Take 1 tablet (25 mcg total) by mouth daily for low thyroid hormone, Disp: 90 tablet, Rfl: 1   Multiple Vitamin (MULTI-VITAMIN DAILY) TABS, Take 1 tablet by mouth at bedtime for supplementation (Patient not taking: Reported on 10/15/2023),  Disp: 14 tablet, Rfl: 0   Zinc 220 (50 Zn) MG CAPS, Take 1 capsule by mouth daily for minerals, electrolytes (Patient not taking: Reported on 10/01/2023), Disp: 14 capsule, Rfl: 0 Allergies  Allergen Reactions   Hydralazine Other (See Comments)   Hydralazine Hcl Other (See Comments)    Hair loss   Hydrocodone Itching and Other (See Comments)    Upset stomach   Metformin And Related Nausea And Vomiting and Other (See Comments)    Stomach pains, also   Other Nausea Only and Other (See Comments)    Lettuce- Does not digest this!!   Plaquenil [Hydroxychloroquine Sulfate] Hives   Shellfish Allergy Nausea And Vomiting   Shellfish-Derived Products Nausea Only and Other (See Comments)    Caused an upset stomach   Shrimp (Diagnostic) Nausea Only and Other (See Comments)    Upset stomach    Sulfa Antibiotics Hives     Social History   Socioeconomic History   Marital status: Legally Separated    Spouse name: [name]   Number of children: 1   Years of education: some colle   Highest education level: Not on file  Occupational History   Occupation: Employed FT as Warden/ranger    Comment: [employer] , NA  Tobacco Use   Smoking status: Former    Current packs/day: 0.25    Average packs/day: 0.3 packs/day for 0.5 years (0.1 ttl pk-yrs)    Types: Cigarettes    Passive exposure: Never   Smokeless tobacco: Never   Tobacco comments:    Former smoker 11/03/21  Vaping Use   Vaping status: Never Used  Substance and Sexual Activity   Alcohol use: No   Drug use: No   Sexual activity: Not Currently    Birth control/protection: None  Other Topics Concern   Not on file  Social History Narrative   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    Social Determinants of Health   Financial Resource Strain: Low Risk  (10/22/2023)   Overall Financial Resource Strain (CARDIA)    Difficulty of Paying Living Expenses: Not very hard  Food Insecurity: No Food Insecurity  (10/22/2023)   Hunger Vital Sign    Worried About Running Out of Food in the Last Year: Never true    Ran Out of Food in the Last Year: Never true  Transportation Needs: No Transportation Needs (10/22/2023)   PRAPARE - Administrator, Civil Service (Medical): No    Lack of Transportation (Non-Medical): No  Physical Activity: Inactive (10/22/2023)   Exercise Vital Sign    Days of Exercise per Week: 0 days    Minutes of Exercise per Session: 0 min  Stress: No Stress Concern  Present (10/22/2023)   [employer] of Occupational Health - Occupational Stress Questionnaire    Feeling of Stress : Not at all  Social Connections: Unknown (10/22/2023)   Social Connection and Isolation Panel [NHANES]    Frequency of Communication with Friends and Family: More than three times a week    Frequency of Social Gatherings with Friends and Family: More than three times a week    Attends Religious Services: Not on file    Active Member of Clubs or Organizations: Yes    Attends Banker Meetings: Not on file    Marital Status: Married  Intimate Partner Violence: Not At Risk (10/22/2023)   Humiliation, Afraid, Rape, and Kick questionnaire    Fear of Current or Ex-Partner: No    Emotionally Abused: No    Physically Abused: No    Sexually Abused: No    Physical Exam      Future Appointments  Date Time Provider Department Center  11/21/2023 11:00 AM [facility]  11/21/2023 12:00 PM Victorino Sparrow, MD [facility]  12/03/2023 10:10 AM Marcine Matar, MD [facility] None

## 2023-11-19 NOTE — Telephone Encounter (Signed)
Spoke with Nancy [name] at  [employer] to follow-up and to advise that patient has an appointment on 12/03/2023 with PCP. Nancy [name] said she spoke with someone and they were going to send an email to the department that handles the oxygen extensions and have them contact us.

## 2023-11-20 ENCOUNTER — Other Ambulatory Visit (HOSPITAL_BASED_OUTPATIENT_CLINIC_OR_DEPARTMENT_OTHER): Payer: Self-pay

## 2023-11-20 NOTE — Progress Notes (Unsigned)
POST OPERATIVE OFFICE NOTE    CC:  F/u for surgery  HPI:  Nancy [name] is a 62 y.o. female with ESRD in need for new HD access.   Prior history includes: Right brachiocephalic fistula which failed to mature Right brachiobasilic fistula which was used prior to thrombosis.  On exam today, Nancy [name] was doing well, accompanied by her husband.  She has had end-stage renal disease for quite some time, and is currently being dialyzed through a left-sided IJ tunneled dialysis catheter.  Nancy [name] had no complaints.  She stated her left-sided [facility] was working well.  She was unsure if she wanted to pursue fistula creation, noting that she really liked her tunneled dialysis catheter understanding it carries a risk of infection.  Prior vein mapping was completed in 2022. She currently dialyzes on MWF.  Allergies  Allergen Reactions   Hydralazine Other (See Comments)   Hydralazine Hcl Other (See Comments)    Hair loss   Hydrocodone Itching and Other (See Comments)    Upset stomach   Metformin And Related Nausea And Vomiting and Other (See Comments)    Stomach pains, also   Other Nausea Only and Other (See Comments)    Lettuce- Does not digest this!!   Plaquenil [Hydroxychloroquine Sulfate] Hives   Shellfish Allergy Nausea And Vomiting   Shellfish-Derived Products Nausea Only and Other (See Comments)    Caused an upset stomach   Shrimp (Diagnostic) Nausea Only and Other (See Comments)    Upset stomach    Sulfa Antibiotics Hives    Current Outpatient Medications  Medication Sig Dispense Refill   Accu-Chek Softclix Lancets lancets Use to check blood sugar 3 times daily. (Patient not taking: Reported on 11/19/2023) 100 each 6   albuterol (VENTOLIN HFA) 108 (90 Base) MCG/ACT inhaler Inhale 2 puffs into the lungs every 6 (six) hours as needed. 18 g 3   amLODipine (NORVASC) 10 MG tablet Take 1 tablet (10 mg total) by mouth daily for hypertension 90 tablet 1   aspirin 81 MG EC tablet Take 1  tablet (81 mg total) by mouth daily. 60 tablet 1   atorvastatin (LIPITOR) 40 MG tablet Take 1 tablet (40 mg total) by mouth daily for antilipids 90 tablet 1   benzonatate (TESSALON) 100 MG capsule Take 1 capsule (100 mg total) by mouth 3 (three) times daily as needed for cough. (Patient not taking: Reported on 10/08/2023) 21 capsule 0   Blood Glucose Monitoring Suppl (ACCU-CHEK GUIDE) w/Device KIT Use to check blood sugar 3 times daily. (Patient not taking: Reported on 10/15/2023) 1 kit 0   ezetimibe (ZETIA) 10 MG tablet Take 1 tablet (10 mg total) by mouth daily for antilipids 90 tablet 1   glucose blood (ACCU-CHEK GUIDE) test strip Use to check blood sugar 3 times daily. (Patient not taking: Reported on 10/15/2023) 100 each 6   Insulin Pen Needle 32G X 4 MM MISC Use to inject insulin as directed. (Patient not taking: Reported on 10/15/2023) 100 each 4   isosorbide mononitrate (IMDUR) 30 MG 24 hr tablet Take 2 tablets (60 mg total) by mouth daily for blood pressure 180 tablet 1   labetalol (NORMODYNE) 300 MG tablet Take 1 tablet (300 mg total) by mouth 2 (two) times daily for beta blockers 180 tablet 1   levothyroxine (SYNTHROID) 25 MCG tablet Take 1 tablet (25 mcg total) by mouth daily for low thyroid hormone 90 tablet 1   Multiple Vitamin (MULTI-VITAMIN DAILY) TABS Take 1 tablet by mouth at bedtime  for supplementation (Patient not taking: Reported on 10/15/2023) 14 tablet 0   omeprazole (PRILOSEC) 20 MG capsule Take 2 capsules (40 mg total) by mouth daily for GERD 180 capsule 1   ondansetron (ZOFRAN) 8 MG tablet Take 1 tablet (8 mg total) by mouth every 8 (eight) hours as needed for nausea 42 tablet 0   polyethylene glycol (MIRALAX / GLYCOLAX) 17 g packet Give 17 gram by mouth one time a day for constipation (Patient taking differently: Take 1 packet by mouth daily. As needed) 14 packet 0   sevelamer carbonate (RENVELA) 800 MG tablet Take 3 tablets (2,400 mg total) by mouth 3 (three) times daily  with meals and 1 tablet with snacks. 1080 tablet 3   Zinc 220 (50 Zn) MG CAPS Take 1 capsule by mouth daily for minerals, electrolytes (Patient not taking: Reported on 10/01/2023) 14 capsule 0   No current facility-administered medications for this visit.     ROS:  See HPI  Physical Exam:  Incision:  right upper arm incisions healed well. No signs of infection. Extremities:  right basilic fistula with palpable thrill. Palpable R radial pulse Neuro: intact motor and sensation in RUE Bilateral below-knee amputations  Studies: Dialysis Access Duplex (23-Sep-2022):  OUTFLOW VEINPSV (cm/s)Diameter (cm)Depth (cm)          Describe              +------------+----------+-------------+----------+-------------------------  ----+  Near axilla    492                           no disease observed;  however                                                   vessel narrows  slightly     +------------+----------+-------------+----------+-------------------------  ----+  Prox UA         67        0.78        0.45                                   +------------+----------+-------------+----------+-------------------------  ----+  Mid UA          97        0.65        0.45                                   +------------+----------+-------------+----------+-------------------------  ----+  Dist UA        398        0.65        0.38                                   +------------+----------+-------------+----------+-------------------------  ----+    Assessment/Plan:  This is a 62 y.o. 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   She presents today for new access.  Most recent vein mapping completed in 2022.  I had a long conversation with Nancy [name] and her husband regarding new HD access.  In the right arm, this to be  limited AV graft, however the left arm, there may have usable vein.  Imaging in 2022  demonstrated small basilic vein, however I do think it is worth evaluating the vein with formal vein mapping prior to pursuing AV graft due to Nancy [name]'s young age.  I have ordered formal vein mapping.  I plan to see her in clinic in the coming weeks to discuss the findings and present options.  She made it very clear that she may choose to continue dialysis through her tunneled dialysis catheter, but is interested in understanding all of her options.  Nancy Sparrow MD [facility] [phone] Total time of patient care including pre-visit research, consultation, and documentation greater than 30 minutes

## 2023-11-21 ENCOUNTER — Ambulatory Visit: Payer: Medicare Other | Admitting: Vascular Surgery

## 2023-11-21 ENCOUNTER — Encounter: Payer: Self-pay | Admitting: Vascular Surgery

## 2023-11-21 ENCOUNTER — Ambulatory Visit (HOSPITAL_COMMUNITY)
Admission: RE | Admit: 2023-11-21 | Discharge: 2023-11-21 | Disposition: A | Discharge status: Home / Self Care | Payer: Medicare Other | Source: Ambulatory Visit | Attending: Vascular Surgery | Admitting: Vascular Surgery

## 2023-11-21 VITALS — BP 158/72 | HR 77 | Resp 20 | Ht 63.0 in | Wt 228.0 lb

## 2023-11-21 DIAGNOSIS — Z992 Dependence on renal dialysis: Secondary | ICD-10-CM

## 2023-11-21 DIAGNOSIS — N186 End stage renal disease: Secondary | ICD-10-CM | POA: Diagnosis not present

## 2023-11-25 ENCOUNTER — Telehealth: Payer: Self-pay | Admitting: *Deleted

## 2023-11-26 ENCOUNTER — Other Ambulatory Visit (HOSPITAL_COMMUNITY): Payer: Self-pay | Admitting: Emergency Medicine

## 2023-11-26 ENCOUNTER — Other Ambulatory Visit: Payer: Self-pay

## 2023-11-26 DIAGNOSIS — N186 End stage renal disease: Secondary | ICD-10-CM

## 2023-11-26 NOTE — Telephone Encounter (Signed)
Spoke with patient and scheduled left arm fistula creation for 12/24/23. Instructions provided and will also be mailed to patient. Pt verbalized understanding.

## 2023-11-26 NOTE — Progress Notes (Signed)
Paramedicine Encounter    Patient ID: Nancy [name], female    DOB: 12-02-1961, 62 y.o.   MRN: [id number]   Complaints NONE  Assessment A&O x 4, skin W&D w/ good color. Denies chest pain or SOB.  Lung sounds clear and equal bilat. No edema noted.    Compliance with meds YES   Pill box filled x 1 week  Refills needed NONE  Meds changes since last visit NONE    Social changes NONE   BP (!) 160/88 (BP Location: Left Arm, Patient Position: Sitting, Cuff Size: Normal)   Pulse 82   Resp 16   Wt 229 lb 12.8 oz (104.2 kg)   SpO2 99%   BMI 40.71 kg/m  Weight yesterday- not taken Last visit weight-228lb  ATF Nancy [name] reporting to be feeling well.  She has been compliant with all meds.  And pill box reconciled x 1 week.   She denies chest pain or SOB.  Lung sounds clear and equal bilat.  No peripheral edema noted.   Pt is doing well with med compliance and is compliant w/ her dialysis treatments.  ACTION: Home visit completed  Nancy [name] [phone] 11/26/23  Patient Care Team: Marcine Matar, MD as PCP - General (Internal Medicine) Runell Gess, MD as PCP - Cardiology (Cardiology) Laurey Morale, MD as PCP - Advanced Heart Failure (Cardiology) Center, [facility]  Patient Active Problem List   Diagnosis Date Noted   History of CVA (cerebrovascular accident) 03/22/2023   Community acquired pneumonia 03/22/2023   Acute respiratory failure with hypoxia (HCC) 11/04/2022   Pain, unspecified 08/15/2022   Complication of vascular dialysis catheter 08/07/2022   Chest pain 05/30/2022   Fluid overload 05/30/2022   Acute pulmonary edema (HCC) 05/30/2022   Iron deficiency anemia, unspecified 04/26/2022   Moderate protein-calorie malnutrition (HCC) 04/18/2022   Nausea 04/10/2022   Hyponatremia 04/04/2022   Prolonged QT interval 04/04/2022   Anaphylactic shock, unspecified, initial encounter 02/23/2022   Coagulation defect, unspecified  (HCC) 02/23/2022   Other allergy, initial encounter 02/23/2022   Pressure injury of skin 02/19/2022   Anemia in chronic kidney disease 02/16/2022   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) 02/16/2022   Hyperlipidemia, unspecified 02/16/2022   Hypertensive heart and chronic kidney disease with heart failure and with stage 5 chronic kidney disease, or end stage renal disease (HCC) 02/16/2022   Personal history of nicotine dependence 02/16/2022   Personal history of transient ischemic attack (TIA), and cerebral infarction without residual deficits 02/16/2022   Secondary hyperparathyroidism of renal origin (HCC) 02/16/2022   Type 2 diabetes mellitus with diabetic peripheral angiopathy without gangrene (HCC) 02/16/2022   Renal anasarca 02/14/2022   ESRD on dialysis (HCC) 02/14/2022   Hyperkalemia 02/14/2022   S/P arteriovenous (AV) fistula creation 12/22/2021   Critical limb ischemia of both lower extremities (HCC) 11/22/2021   Subacute osteomyelitis, right ankle and foot (HCC)    Peripheral artery disease (HCC) 10/27/2021   Acute exacerbation of congestive heart failure (HCC) 10/24/2021   Acute cardiogenic pulmonary edema (HCC) 10/06/2021   CAD (coronary artery disease) 10/06/2021   Goals of care, counseling/discussion    Gangrene of left foot (HCC)    Cellulitis of left foot    Diabetic foot ulcers (HCC) 09/04/2021   CHF (congestive heart failure) (HCC) 07/07/2021   Acute postoperative anemia due to expected blood loss superimposed on anemia of chronic renal insufficiency 06/30/2021   CKD (chronic kidney disease), stage IV (HCC) 06/11/2021   Chronic  diastolic CHF (congestive heart failure) (HCC) 06/10/2021   Abnormal nuclear stress test    Dyspnea on exertion 03/07/2021   Orthopnea 02/25/2021   History of cerebrovascular accident (CVA) with residual deficit 05/06/2020   Diabetic peripheral neuropathy (HCC) 04/26/2020   AKI (acute kidney injury) (HCC) 04/20/2020   Generalized weakness  04/20/2020   Dehydration 04/20/2020   Generalized abdominal pain    Pancreatitis, acute 02/29/2020   Cerebrovascular accident (CVA) (HCC) 11/08/2019   Stenosis of right carotid artery 11/08/2019   Mixed diabetic hyperlipidemia associated with type 2 diabetes mellitus (HCC) 11/08/2019   Cortical age-related cataract of both eyes 08/30/2019   Gait abnormality 08/30/2019   Statin declined 08/30/2019   Gastroesophageal reflux disease without esophagitis 04/18/2018   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   Insulin dependent type 2 diabetes mellitus (HCC) 10/29/2017   History of macular degeneration 10/29/2017   Chronic cervical pain 10/29/2016   Myalgia 09/14/2016   Insomnia 09/14/2016   Tinea pedis of both feet 08/20/2016   Macular degeneration 08/17/2016   Low back pain 09/21/2014   Hypertensive urgency 09/21/2014   Diabetic gastroparesis associated with type 2 diabetes mellitus (HCC) 09/14/2014   HTN (hypertension) 09/08/2014   Thyroid nodule 08/17/2014   Morbid obesity (HCC) 08/17/2014   Hypokalemia 08/06/2014   Type II diabetes mellitus with neurological manifestations, uncontrolled 08/04/2014    Current Outpatient Medications:    albuterol (VENTOLIN HFA) 108 (90 Base) MCG/ACT inhaler, Inhale 2 puffs into the lungs every 6 (six) hours as needed., Disp: 18 g, Rfl: 3   amLODipine (NORVASC) 10 MG tablet, Take 1 tablet (10 mg total) by mouth daily for hypertension, Disp: 90 tablet, Rfl: 1   aspirin 81 MG EC tablet, Take 1 tablet (81 mg total) by mouth daily., Disp: 60 tablet, Rfl: 1   atorvastatin (LIPITOR) 40 MG tablet, Take 1 tablet (40 mg total) by mouth daily for antilipids, Disp: 90 tablet, Rfl: 1   ezetimibe (ZETIA) 10 MG tablet, Take 1 tablet (10 mg total) by mouth daily for antilipids, Disp: 90 tablet, Rfl: 1   isosorbide mononitrate (IMDUR) 30 MG 24 hr tablet, Take 2 tablets (60 mg total) by mouth daily for blood pressure, Disp: 180 tablet, Rfl: 1   labetalol (NORMODYNE) 300 MG tablet,  Take 1 tablet (300 mg total) by mouth 2 (two) times daily for beta blockers, Disp: 180 tablet, Rfl: 1   levothyroxine (SYNTHROID) 25 MCG tablet, Take 1 tablet (25 mcg total) by mouth daily for low thyroid hormone, Disp: 90 tablet, Rfl: 1   omeprazole (PRILOSEC) 20 MG capsule, Take 2 capsules (40 mg total) by mouth daily for GERD, Disp: 180 capsule, Rfl: 1   ondansetron (ZOFRAN) 8 MG tablet, Take 1 tablet (8 mg total) by mouth every 8 (eight) hours as needed for nausea, Disp: 42 tablet, Rfl: 0   sevelamer carbonate (RENVELA) 800 MG tablet, Take 3 tablets (2,400 mg total) by mouth 3 (three) times daily with meals and 1 tablet with snacks., Disp: 1080 tablet, Rfl: 3   Accu-Chek Softclix Lancets lancets, Use to check blood sugar 3 times daily. (Patient not taking: Reported on 11/26/2023), Disp: 100 each, Rfl: 6   benzonatate (TESSALON) 100 MG capsule, Take 1 capsule (100 mg total) by mouth 3 (three) times daily as needed for cough. (Patient not taking: Reported on 11/26/2023), Disp: 21 capsule, Rfl: 0   Blood Glucose Monitoring Suppl (ACCU-CHEK GUIDE) w/Device KIT, Use to check blood sugar 3 times daily. (Patient not taking: Reported on 11/26/2023), Disp:  1 kit, Rfl: 0   glucose blood (ACCU-CHEK GUIDE) test strip, Use to check blood sugar 3 times daily. (Patient not taking: Reported on 11/26/2023), Disp: 100 each, Rfl: 6   Insulin Pen Needle 32G X 4 MM MISC, Use to inject insulin as directed., Disp: 100 each, Rfl: 4   Multiple Vitamin (MULTI-VITAMIN DAILY) TABS, Take 1 tablet by mouth at bedtime for supplementation (Patient not taking: Reported on 11/26/2023), Disp: 14 tablet, Rfl: 0   polyethylene glycol (MIRALAX / GLYCOLAX) 17 g packet, Give 17 gram by mouth one time a day for constipation (Patient not taking: Reported on 11/26/2023), Disp: 14 packet, Rfl: 0   Zinc 220 (50 Zn) MG CAPS, Take 1 capsule by mouth daily for minerals, electrolytes (Patient not taking: Reported on 11/26/2023), Disp: 14 capsule,  Rfl: 0 Allergies  Allergen Reactions   Hydralazine Other (See Comments)   Hydralazine Hcl Other (See Comments)    Hair loss   Hydrocodone Itching and Other (See Comments)    Upset stomach   Metformin And Related Nausea And Vomiting and Other (See Comments)    Stomach pains, also   Other Nausea Only and Other (See Comments)    Lettuce- Does not digest this!!   Plaquenil [Hydroxychloroquine Sulfate] Hives   Shellfish Allergy Nausea And Vomiting   Shellfish-Derived Products Nausea Only and Other (See Comments)    Caused an upset stomach   Shrimp (Diagnostic) Nausea Only and Other (See Comments)    Upset stomach    Sulfa Antibiotics Hives     Social History   Socioeconomic History   Marital status: Legally Separated    Spouse name: [name]   Number of children: 1   Years of education: some colle   Highest education level: Not on file  Occupational History   Occupation: Employed FT as Warden/ranger    Comment: [employer] , [place]  Tobacco Use   Smoking status: Former    Current packs/day: 0.25    Average packs/day: 0.3 packs/day for 0.5 years (0.1 ttl pk-yrs)    Types: Cigarettes    Passive exposure: Never   Smokeless tobacco: Never   Tobacco comments:    Former smoker 11/03/21  Vaping Use   Vaping status: Never Used  Substance and Sexual Activity   Alcohol use: No   Drug use: No   Sexual activity: Not Currently    Birth control/protection: None  Other Topics Concern   Not on file  Social History Narrative   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    Social Determinants of Health   Financial Resource Strain: Low Risk  (10/22/2023)   Overall Financial Resource Strain (CARDIA)    Difficulty of Paying Living Expenses: Not very hard  Food Insecurity: No Food Insecurity (10/22/2023)   Hunger Vital Sign    Worried About Running Out of Food in the Last Year: Never true    Ran Out of Food in the Last Year: Never true  Transportation Needs: No  Transportation Needs (10/22/2023)   PRAPARE - Administrator, Civil Service (Medical): No    Lack of Transportation (Non-Medical): No  Physical Activity: Inactive (10/22/2023)   Exercise Vital Sign    Days of Exercise per Week: 0 days    Minutes of Exercise per Session: 0 min  Stress: No Stress Concern Present (10/22/2023)   [employer] of Occupational Health - Occupational Stress Questionnaire    Feeling of Stress : Not at all  Social  Connections: Unknown (10/22/2023)   Social Connection and Isolation Panel [NHANES]    Frequency of Communication with Friends and Family: More than three times a week    Frequency of Social Gatherings with Friends and Family: More than three times a week    Attends Religious Services: Not on file    Active Member of Clubs or Organizations: Yes    Attends Banker Meetings: Not on file    Marital Status: Married  Intimate Partner Violence: Not At Risk (10/22/2023)   Humiliation, Afraid, Rape, and Kick questionnaire    Fear of Current or Ex-Partner: No    Emotionally Abused: No    Physically Abused: No    Sexually Abused: No    Physical Exam      Future Appointments  Date Time Provider Department Center  12/03/2023 10:10 AM Marcine Matar, MD [id number] None

## 2023-11-28 ENCOUNTER — Other Ambulatory Visit (HOSPITAL_COMMUNITY): Payer: Self-pay

## 2023-12-02 ENCOUNTER — Other Ambulatory Visit (HOSPITAL_COMMUNITY): Payer: Self-pay

## 2023-12-03 ENCOUNTER — Encounter: Payer: Self-pay | Admitting: Internal Medicine

## 2023-12-03 ENCOUNTER — Ambulatory Visit: Payer: Medicare Other | Attending: Internal Medicine | Admitting: Internal Medicine

## 2023-12-03 ENCOUNTER — Other Ambulatory Visit (HOSPITAL_COMMUNITY): Payer: Self-pay | Admitting: Emergency Medicine

## 2023-12-03 ENCOUNTER — Other Ambulatory Visit: Payer: Self-pay

## 2023-12-03 ENCOUNTER — Other Ambulatory Visit (HOSPITAL_COMMUNITY): Payer: Self-pay

## 2023-12-03 VITALS — BP 119/67 | HR 79 | Temp 98.0°F

## 2023-12-03 DIAGNOSIS — K219 Gastro-esophageal reflux disease without esophagitis: Secondary | ICD-10-CM | POA: Insufficient documentation

## 2023-12-03 DIAGNOSIS — F411 Generalized anxiety disorder: Secondary | ICD-10-CM | POA: Insufficient documentation

## 2023-12-03 DIAGNOSIS — K3184 Gastroparesis: Secondary | ICD-10-CM | POA: Insufficient documentation

## 2023-12-03 DIAGNOSIS — Z794 Long term (current) use of insulin: Secondary | ICD-10-CM | POA: Insufficient documentation

## 2023-12-03 DIAGNOSIS — E1140 Type 2 diabetes mellitus with diabetic neuropathy, unspecified: Secondary | ICD-10-CM | POA: Insufficient documentation

## 2023-12-03 DIAGNOSIS — Z89512 Acquired absence of left leg below knee: Secondary | ICD-10-CM | POA: Insufficient documentation

## 2023-12-03 DIAGNOSIS — Z8673 Personal history of transient ischemic attack (TIA), and cerebral infarction without residual deficits: Secondary | ICD-10-CM | POA: Diagnosis not present

## 2023-12-03 DIAGNOSIS — Z992 Dependence on renal dialysis: Secondary | ICD-10-CM | POA: Diagnosis not present

## 2023-12-03 DIAGNOSIS — I152 Hypertension secondary to endocrine disorders: Secondary | ICD-10-CM | POA: Insufficient documentation

## 2023-12-03 DIAGNOSIS — E785 Hyperlipidemia, unspecified: Secondary | ICD-10-CM | POA: Insufficient documentation

## 2023-12-03 DIAGNOSIS — I132 Hypertensive heart and chronic kidney disease with heart failure and with stage 5 chronic kidney disease, or end stage renal disease: Secondary | ICD-10-CM | POA: Diagnosis not present

## 2023-12-03 DIAGNOSIS — Z89511 Acquired absence of right leg below knee: Secondary | ICD-10-CM | POA: Insufficient documentation

## 2023-12-03 DIAGNOSIS — I2510 Atherosclerotic heart disease of native coronary artery without angina pectoris: Secondary | ICD-10-CM | POA: Insufficient documentation

## 2023-12-03 DIAGNOSIS — E039 Hypothyroidism, unspecified: Secondary | ICD-10-CM

## 2023-12-03 DIAGNOSIS — I5032 Chronic diastolic (congestive) heart failure: Secondary | ICD-10-CM | POA: Insufficient documentation

## 2023-12-03 DIAGNOSIS — E1122 Type 2 diabetes mellitus with diabetic chronic kidney disease: Secondary | ICD-10-CM | POA: Insufficient documentation

## 2023-12-03 DIAGNOSIS — Z95810 Presence of automatic (implantable) cardiac defibrillator: Secondary | ICD-10-CM | POA: Diagnosis not present

## 2023-12-03 DIAGNOSIS — N2581 Secondary hyperparathyroidism of renal origin: Secondary | ICD-10-CM | POA: Diagnosis not present

## 2023-12-03 DIAGNOSIS — E1159 Type 2 diabetes mellitus with other circulatory complications: Secondary | ICD-10-CM | POA: Insufficient documentation

## 2023-12-03 DIAGNOSIS — Z9981 Dependence on supplemental oxygen: Secondary | ICD-10-CM | POA: Insufficient documentation

## 2023-12-03 DIAGNOSIS — N186 End stage renal disease: Secondary | ICD-10-CM | POA: Diagnosis not present

## 2023-12-03 DIAGNOSIS — Z23 Encounter for immunization: Secondary | ICD-10-CM

## 2023-12-03 DIAGNOSIS — I272 Pulmonary hypertension, unspecified: Secondary | ICD-10-CM | POA: Insufficient documentation

## 2023-12-03 DIAGNOSIS — F419 Anxiety disorder, unspecified: Secondary | ICD-10-CM | POA: Diagnosis present

## 2023-12-03 DIAGNOSIS — E1143 Type 2 diabetes mellitus with diabetic autonomic (poly)neuropathy: Secondary | ICD-10-CM | POA: Insufficient documentation

## 2023-12-03 DIAGNOSIS — J9611 Chronic respiratory failure with hypoxia: Secondary | ICD-10-CM | POA: Insufficient documentation

## 2023-12-03 DIAGNOSIS — Z2821 Immunization not carried out because of patient refusal: Secondary | ICD-10-CM

## 2023-12-03 LAB — POCT GLYCOSYLATED HEMOGLOBIN (HGB A1C): HbA1c, POC (controlled diabetic range): 7.9 % — AB (ref 0.0–7.0)

## 2023-12-03 LAB — GLUCOSE, POCT (MANUAL RESULT ENTRY): POC Glucose: 193 mg/dL — AB (ref 70–99)

## 2023-12-03 MED ORDER — ZOSTER VAC RECOMB ADJUVANTED 50 MCG/0.5ML IM SUSR: 0.5000 mL | Freq: Once | INTRAMUSCULAR | 0 refills | Status: AC

## 2023-12-03 MED ORDER — ALPRAZOLAM 0.25 MG PO TABS
0.2500 mg | ORAL_TABLET | ORAL | 1 refills | Status: DC | PRN
  Filled 2023-12-03: qty 12, 12d supply, fill #0
  Filled 2023-12-23 – 2023-12-27 (×2): qty 12, 12d supply, fill #1

## 2023-12-03 NOTE — Progress Notes (Signed)
Patient ID: Nancy [name], female    DOB: Oct 09, 1961  MRN: [id number]  CC: Follow-up (Follow-up. /Increased anxiety & acid refux - requesting meds to cope with anxiety/Yes to shingles vax. No to Tdap. Discuss colonoscopy)   Subjective: Nancy [name] is a 62 y.o. female who presents for chronic ds management. 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    Patient was last seen by [place] over 2 years ago.  She did not bring her medicines with her. She tells me that she was in the nursing home for 1 year and was released in April of this year.  Main concern today is that she needs a walk test to recertify for home O2.  She is on 2 L mainly with ambulation.  She thinks she is on it for congestive heart failure.  She has an oxygen concentrator at home and also has a portable oxygen.  She has her portable device with her today.  BKA bilateral: Since last visit with me she has undergone bilateral BKA.  She has prosthetic legs and uses a standard walker.  She has not had any recent falls.  Has hospital bed and bedside commode at home.  Washes in the sink.  Trying to get home health aide and was evaluated by a Medicaid nurse who came out to their house about 3 months ago and told her that she would qualify for a home health aide but they never heard anything further. Husband works Monday through Thursday and does the majority of caregiving for her as well.  Patient needs assistance with bathing, clothing, meal prep and transfers.  ESRD: Since I last saw her, she has started dialysis and goes Monday  Wednesday and Fridays.  She has labs sheet with her from most recent dialysis session.  Parathyroid hormone level was elevated due to secondary hyperparathyroidism. -Reports severe anxiety when she goes to dialysis due to the machines beeping.  When she is hooked up to the machine and hears the beep, "I go into a fight or flight mode."  She states that her anxiety during dialysis is getting worse and it makes her reflux act up as well.  I note that she is on a low-dose of levothyroxine.  She confirms being diagnosed with hypothyroidism while in the nursing home.  Reports taking the levothyroxine 25 mcg daily.  DM: She tells me she is on Humalog 70/30 3 units in the evenings.  A1c in August was 6.5.  Today it is 7.9.  She reports no low blood sugar episodes.  HTN/CHF diastolic/HL/pulmonary HTN: I looked on her medication list, she reports compliance with being on aspirin, atorvastatin 40 mg daily, Zetia, Norvasc 10 mg daily, labetalol 300 mg twice a day, isosorbide 60 mg daily.   Patient Active Problem List   Diagnosis Date Noted   History of CVA (cerebrovascular accident) 03/22/2023   Community acquired pneumonia 03/22/2023   Pain, unspecified 08/15/2022   Complication of vascular dialysis catheter 08/07/2022   Chest pain 05/30/2022   Fluid overload 05/30/2022   Iron deficiency anemia, unspecified 04/26/2022   Nausea 04/10/2022   Hyponatremia 04/04/2022  Prolonged QT interval 04/04/2022   Anaphylactic shock, unspecified, initial encounter 02/23/2022   Other allergy, initial encounter 02/23/2022   Pressure injury of skin 02/19/2022   Anemia in chronic kidney disease 02/16/2022   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) 02/16/2022   Hyperlipidemia, unspecified 02/16/2022   Hypertensive heart and chronic kidney disease with heart failure and with stage 5 chronic kidney disease, or end stage renal disease (HCC) 02/16/2022   Personal history of nicotine dependence 02/16/2022   Personal history  of transient ischemic attack (TIA), and cerebral infarction without residual deficits 02/16/2022   Secondary hyperparathyroidism of renal origin (HCC) 02/16/2022   Renal anasarca 02/14/2022   ESRD on dialysis (HCC) 02/14/2022   Hyperkalemia 02/14/2022   S/P arteriovenous (AV) fistula creation 12/22/2021   Peripheral artery disease (HCC) 10/27/2021   Acute exacerbation of congestive heart failure (HCC) 10/24/2021   Acute cardiogenic pulmonary edema (HCC) 10/06/2021   CAD (coronary artery disease) 10/06/2021   Goals of care, counseling/discussion    Gangrene of left foot (HCC)    Cellulitis of left foot    CHF (congestive heart failure) (HCC) 07/07/2021   Acute postoperative anemia due to expected blood loss superimposed on anemia of chronic renal insufficiency 06/30/2021   CKD (chronic kidney disease), stage IV (HCC) 06/11/2021   Chronic diastolic CHF (congestive heart failure) (HCC) 06/10/2021   Abnormal nuclear stress test    Dyspnea on exertion 03/07/2021   Orthopnea 02/25/2021   History of cerebrovascular accident (CVA) with residual deficit 05/06/2020   AKI (acute kidney injury) (HCC) 04/20/2020   Generalized weakness 04/20/2020   Dehydration 04/20/2020   Generalized abdominal pain    Pancreatitis, acute 02/29/2020   Cerebrovascular accident (CVA) (HCC) 11/08/2019   Stenosis of right carotid artery 11/08/2019   Cortical age-related cataract of both eyes 08/30/2019   Gait abnormality 08/30/2019   Statin declined 08/30/2019   Gastroesophageal reflux disease without esophagitis 04/18/2018   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   Insulin dependent type 2 diabetes mellitus (HCC) 10/29/2017   History of macular degeneration 10/29/2017   Chronic cervical pain 10/29/2016   Myalgia 09/14/2016   Insomnia 09/14/2016   Tinea pedis of both feet 08/20/2016   Macular degeneration 08/17/2016   Low back pain 09/21/2014   Hypertensive urgency 09/21/2014   HTN (hypertension) 09/08/2014   Thyroid  nodule 08/17/2014   Morbid obesity (HCC) 08/17/2014   Hypokalemia 08/06/2014   Type II diabetes mellitus with neurological manifestations, uncontrolled 08/04/2014     Current Outpatient Medications on File Prior to Visit  Medication Sig Dispense Refill   Accu-Chek Softclix Lancets lancets Use to check blood sugar 3 times daily. (Patient not taking: Reported on 12/03/2023) 100 each 6   albuterol (VENTOLIN HFA) 108 (90 Base) MCG/ACT inhaler Inhale 2 puffs into the lungs every 6 (six) hours as needed. 18 g 3   amLODipine (NORVASC) 10 MG tablet Take 1 tablet (10 mg total) by mouth daily for hypertension 90 tablet 1   aspirin 81 MG EC tablet Take 1 tablet (81 mg total) by mouth daily. 60 tablet 1   atorvastatin (LIPITOR) 40 MG tablet Take 1 tablet (40 mg total) by mouth daily for antilipids 90 tablet 1   benzonatate (TESSALON) 100 MG capsule Take 1 capsule (100 mg total) by mouth 3 (three) times daily as needed for cough. (Patient not taking: Reported on 12/03/2023) 21 capsule 0   Blood Glucose Monitoring Suppl (ACCU-CHEK GUIDE) w/Device KIT Use to check blood sugar 3 times daily. (Patient not taking: Reported  on 12/03/2023) 1 kit 0   ezetimibe (ZETIA) 10 MG tablet Take 1 tablet (10 mg total) by mouth daily for antilipids 90 tablet 1   glucose blood (ACCU-CHEK GUIDE) test strip Use to check blood sugar 3 times daily. (Patient not taking: Reported on 12/03/2023) 100 each 6   isosorbide mononitrate (IMDUR) 30 MG 24 hr tablet Take 2 tablets (60 mg total) by mouth daily for blood pressure 180 tablet 1   labetalol (NORMODYNE) 300 MG tablet Take 1 tablet (300 mg total) by mouth 2 (two) times daily for beta blockers 180 tablet 1   levothyroxine (SYNTHROID) 25 MCG tablet Take 1 tablet (25 mcg total) by mouth daily for low thyroid hormone 90 tablet 1   Multiple Vitamin (MULTI-VITAMIN DAILY) TABS Take 1 tablet by mouth at bedtime for supplementation (Patient not taking: Reported on 12/03/2023) 14 tablet 0    omeprazole (PRILOSEC) 20 MG capsule Take 2 capsules (40 mg total) by mouth daily for GERD 180 capsule 1   ondansetron (ZOFRAN) 8 MG tablet Take 1 tablet (8 mg total) by mouth every 8 (eight) hours as needed for nausea 42 tablet 0   polyethylene glycol (MIRALAX / GLYCOLAX) 17 g packet Give 17 gram by mouth one time a day for constipation (Patient not taking: Reported on 12/03/2023) 14 packet 0   sevelamer carbonate (RENVELA) 800 MG tablet Take 3 tablets (2,400 mg total) by mouth 3 (three) times daily with meals and 1 tablet with snacks. 1080 tablet 3   Zinc 220 (50 Zn) MG CAPS Take 1 capsule by mouth daily for minerals, electrolytes (Patient not taking: Reported on 12/03/2023) 14 capsule 0   Insulin Pen Needle 32G X 4 MM MISC Use to inject insulin as directed. (Patient not taking: Reported on 12/03/2023) 100 each 4   [DISCONTINUED] Insulin NPH Isophane & Regular (RELION 70/30 [place]) Inject 35 Units into the skin 2 (two) times daily.     No current facility-administered medications on file prior to visit.    Allergies  Allergen Reactions   Hydralazine Other (See Comments)   Hydralazine Hcl Other (See Comments)    Hair loss   Hydrocodone Itching and Other (See Comments)    Upset stomach   Metformin And Related Nausea And Vomiting and Other (See Comments)    Stomach pains, also   Other Nausea Only and Other (See Comments)    Lettuce- Does not digest this!!   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    Comment: [employer] , NA  Tobacco Use   Smoking status: Former    Current packs/day: 0.25    Average packs/day: 0.3 packs/day for 0.5 years (0.1 ttl pk-yrs)    Types: Cigarettes    Passive exposure: Never   Smokeless tobacco: Never   Tobacco comments:    Former smoker 11/03/21  Vaping Use   Vaping status: Never Used  Substance and Sexual Activity   Alcohol use: No  Drug use: No   Sexual activity: Not Currently    Birth control/protection: None  Other Topics Concern   Not on file  Social History Narrative   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    Social Drivers of Health   Financial Resource Strain: Low Risk  (10/22/2023)   Overall Financial Resource Strain (CARDIA)    Difficulty of Paying Living Expenses: Not very hard  Food Insecurity: No Food Insecurity (10/22/2023)   Hunger Vital Sign    Worried About Running Out of Food in the Last Year: Never true    Ran Out of Food in the Last Year: Never true  Transportation Needs: No Transportation Needs (10/22/2023)   PRAPARE - Administrator, Civil Service (Medical): No    Lack of Transportation (Non-Medical): No  Physical Activity: Inactive (10/22/2023)   Exercise Vital Sign    Days of Exercise per Week: 0 days    Minutes of Exercise per Session: 0 min  Stress: No Stress Concern Present (10/22/2023)   [employer] of Occupational Health - Occupational Stress Questionnaire    Feeling of Stress : Not at all  Social Connections: Unknown (10/22/2023)   Social Connection and Isolation Panel [NHANES]    Frequency of Communication with Friends and Family: More than three times a week    Frequency of Social Gatherings with Friends and Family: More than three times a week    Attends Religious Services: Not on file    Active Member of Clubs or Organizations: Yes    Attends Banker Meetings: Not on file    Marital Status: Married  Intimate Partner Violence: Not At  Risk (10/22/2023)   Humiliation, Afraid, Rape, and Kick questionnaire    Fear of Current or Ex-Partner: No    Emotionally Abused: No    Physically Abused: No    Sexually Abused: No    Family History  Problem Relation Age of Onset   Hypertension Mother    Heart disease Mother    Diabetes Mother    Thyroid disease Mother    Congestive Heart Failure Mother    Breast cancer Maternal Grandmother    Colon cancer Maternal Grandfather    Heart attack Sister    Heart disease Brother    Hyperlipidemia Brother    Hypertension Brother    Diabetes Father    Breast cancer Maternal Aunt     Past Surgical History:  Procedure Laterality Date   A/V FISTULAGRAM Right 09/25/2022   Procedure: A/V Fistulagram;  Surgeon: Nada Libman, MD;  Location: [facility];  Service: Cardiovascular;  Laterality: Right;   ABDOMINAL HYSTERECTOMY  2005   AMPUTATION Bilateral 04/04/2022   Procedure: BILATERAL BELOW KNEE AMPUTATION;  Surgeon: Nadara Mustard, MD;  Location: [facility] OR;  Service: Orthopedics;  Laterality: Bilateral;   AV FISTULA PLACEMENT Right 12/22/2021   Procedure: RIGHT ARM ARTERIOVENOUS (AV) BRACIOCPHELAIC FISTULA CREATION;  Surgeon: Maeola Harman, MD;  Location: [facility] OR;  Service: Vascular;  Laterality: Right;   AV FISTULA PLACEMENT Right 02/19/2022   Procedure: RIGHT ARTERIOVENOUS FISTULA;  Surgeon: Chuck Hint, MD;  Location: [facility] OR;  Service: Vascular;  Laterality: Right;   BASCILIC VEIN TRANSPOSITION Right 08/07/2022   Procedure: SECOND STAGE RIGHT BASILIC VEIN TRANSPOSITION;  Surgeon: Chuck Hint, MD;  Location: [facility] OR;  Service: Vascular;  Laterality: Right;   CATARACT EXTRACTION Left 11/2019   CESAREAN SECTION  1983    CHOLECYSTECTOMY N/A 03/05/2020  Procedure: LAPAROSCOPIC CHOLECYSTECTOMY WITH INTRAOPERATIVE CHOLANGIOGRAM;  Surgeon: Manus Rudd, MD;  Location: [facility] ORS;  Service: General;  Laterality: N/A;   ESOPHAGOGASTRODUODENOSCOPY (EGD) WITH PROPOFOL  Left 08/26/2014   Procedure: ESOPHAGOGASTRODUODENOSCOPY (EGD) WITH PROPOFOL;  Surgeon: Willis Modena, MD;  Location: [facility] ENDOSCOPY;  Service: Endoscopy;  Laterality: Left;   ESOPHAGOGASTRODUODENOSCOPY (EGD) WITH PROPOFOL N/A 03/03/2020   Procedure: ESOPHAGOGASTRODUODENOSCOPY (EGD) WITH PROPOFOL;  Surgeon: Beverley Fiedler, MD;  Location: [facility];  Service: Gastroenterology;  Laterality: N/A;   IR FLUORO GUIDE CV LINE RIGHT  02/15/2022   IR US GUIDE VASC ACCESS RIGHT  02/15/2022   PERIPHERAL VASCULAR BALLOON ANGIOPLASTY Right 09/25/2022   Procedure: PERIPHERAL VASCULAR BALLOON ANGIOPLASTY;  Surgeon: Nada Libman, MD;  Location: [facility];  Service: Cardiovascular;  Laterality: Right;  AVF   RIGHT HEART CATH N/A 07/14/2021   Procedure: RIGHT HEART CATH;  Surgeon: Laurey Morale, MD;  Location: [facility] INVASIVE CV LAB;  Service: Cardiovascular;  Laterality: N/A;   RIGHT/LEFT HEART CATH AND CORONARY ANGIOGRAPHY N/A 04/27/2021   Procedure: RIGHT/LEFT HEART CATH AND CORONARY ANGIOGRAPHY;  Surgeon: Runell Gess, MD;  Location: [facility];  Service: Cardiovascular;  Laterality: N/A;    ROS: Review of Systems Negative except as stated above  PHYSICAL EXAM: BP 119/67 (BP Location: Left Arm, Patient Position: Sitting, Cuff Size: Large)   Pulse 79   Temp 98 F (36.7 C) (Oral)   SpO2 98%   Wt Readings from Last 3 Encounters:  12/03/23 242 lb 9.6 oz (110 kg)  11/26/23 229 lb 12.8 oz (104.2 kg)  11/21/23 228 lb (103.4 kg)   Pulse ox room air at rest is 97%. Pulse ox room air with taking just several steps dropped to 76%. Pulse ox ambulation 3 L O2 95%.  Physical Exam  General appearance -patient sitting in wheelchair.  She appears chronically ill but in no acute distress. Mental status -patient answers questions appropriately. Mouth - mucous membranes moist, pharynx normal without lesions Neck - supple, no significant adenopathy Chest - clear to auscultation, no wheezes,  rales or rhonchi, symmetric air entry Heart - normal rate, regular rhythm, normal S1, S2, no murmurs, rubs, clicks or gallops Extremities -patient is wearing her prosthesis on bilateral BKA       Latest Ref Rng & Units 07/24/2023   12:24 PM 03/24/2023    3:28 AM 03/23/2023    1:27 AM  CMP  Glucose 70 - 99 mg/dL 811  914  90   BUN 8 - 23 mg/dL 27  33  42   Creatinine 0.44 - 1.00 mg/dL 7.82  9.56  2.13   Sodium 135 - 145 mmol/L 133  135  138   Potassium 3.5 - 5.1 mmol/L 4.2  4.8  6.0   Chloride 98 - 111 mmol/L 91  99  100   CO2 22 - 32 mmol/L 23  26  28    Calcium 8.9 - 10.3 mg/dL 7.9  8.2  8.1   Total Protein 6.5 - 8.1 g/dL 7.4  7.2    Total Bilirubin 0.3 - 1.2 mg/dL 0.8  0.8    Alkaline Phos 38 - 126 U/L 81  97    AST 15 - 41 U/L 12  19    ALT 0 - 44 U/L 13  14     Lipid Panel     Component Value Date/Time   CHOL 110 10/08/2023 1145   CHOL 193 02/11/2020 1103   TRIG 95 10/08/2023 1145  HDL 45 10/08/2023 1145   HDL 40 02/11/2020 1103   CHOLHDL 2.4 10/08/2023 1145   VLDL 19 10/08/2023 1145   LDLCALC 46 10/08/2023 1145   LDLCALC 122 (H) 02/11/2020 1103    CBC    Component Value Date/Time   WBC 11.4 (H) 07/24/2023 1224   RBC 3.91 07/24/2023 1224   HGB 11.3 (L) 07/24/2023 1224   HGB 8.8 (L) 06/30/2021 1226   HGB 12.5 07/08/2008 1551   HCT 36.6 07/24/2023 1224   HCT 28.3 (L) 06/30/2021 1226   HCT 37.3 07/08/2008 1551   PLT 223 07/24/2023 1224   PLT 304 06/30/2021 1226   MCV 93.6 07/24/2023 1224   MCV 89 06/30/2021 1226   MCV 84.7 07/08/2008 1551   MCH 28.9 07/24/2023 1224   MCHC 30.9 07/24/2023 1224   RDW 15.0 07/24/2023 1224   RDW 13.6 06/30/2021 1226   RDW 13.0 07/08/2008 1551   LYMPHSABS 1.5 07/24/2023 1224   LYMPHSABS 2.5 02/23/2021 1523   LYMPHSABS 4.6 (H) 07/08/2008 1551   MONOABS 1.2 (H) 07/24/2023 1224   MONOABS 0.6 07/08/2008 1551   EOSABS 0.4 07/24/2023 1224   EOSABS 0.5 (H) 02/23/2021 1523   BASOSABS 0.1 07/24/2023 1224   BASOSABS 0.1 02/23/2021  1523   BASOSABS 0.1 07/08/2008 1551    ASSESSMENT AND PLAN: 1. Chronic respiratory failure with hypoxia, on home O2 therapy (HCC) (Primary) 2. Chronic diastolic CHF (congestive heart failure) (HCC) Patient meets criteria to continue using her O2 more so with ambulation at 3 L.  I will [facility] to [facility] for with this information. -Message sent to our caseworker to assist in getting her home health aide.  3. ESRD on dialysis [facility]) Patient on dialysis 3 days a week.  4. Type 2 diabetes mellitus with other circulatory complication, with long-term current use of insulin (HCC) A1c not at goal.  She tells me she is on Humalog 70/30 3 units in the evenings.  I recommend taking 2 units in the mornings with breakfast and continuing her 3 units in the evenings.  I also request that she bring one of her insulin pens with her on next visit for me to verify what she is taking. - POCT glycosylated hemoglobin (Hb A1C) - POCT glucose (manual entry)  5. Hypothyroidism (acquired) Continue low-dose levothyroxine.  We will check TSH today. - TSH  6. GAD (generalized anxiety disorder) Discussed putting her on very low-dose of Xanax to take before her dialysis sessions just 3 days a week.  Advised that the medication can cause drowsiness.  [place] controlled substance reporting system reviewed. - ALPRAZolam (XANAX) 0.25 MG tablet; Take 1 tablet (0.25 mg total) by mouth on Mon/Wed and Fri at Hemodialysis. (Patient not taking: Reported on 12/03/2023)  Dispense: 12 tablet; Refill: 1  7. S/P bilateral BKA (below knee amputation) [facility]) Patient wearing her prosthesis.  8. Secondary hyperparathyroidism of renal origin (HCC) On dialysis.  9. Hypertension associated with type 2 diabetes mellitus (HCC) Well-controlled on medications which include Norvasc 10 mg daily, labetalol 300 mg twice a day, isosorbide 30 mg 2 tablets daily  10. Tetanus, diphtheria, and acellular pertussis (Tdap)  vaccination declined  11. Need for shingles vaccine Given rxn to get shingrix vaccine from our pharmacy - Zoster Vaccine Adjuvanted [facility]) injection; Inject 0.5 mLs into the muscle once for 1 dose. (Patient not taking: Reported on 12/03/2023)  Dispense: 0.5 mL; Refill: 0  HM:  pt declines MMG and defers on all forms of colon CA  screen  Patient was given the opportunity to ask questions.  Patient verbalized understanding of the plan and was able to repeat key elements of the plan.   This documentation was completed using Paediatric nurse.  Any transcriptional errors are unintentional.  Orders Placed This Encounter  Procedures   TSH   POCT glycosylated hemoglobin (Hb A1C)   POCT glucose (manual entry)     Requested Prescriptions   Signed Prescriptions Disp Refills   ALPRAZolam (XANAX) 0.25 MG tablet 12 tablet 1    Sig: Take 1 tablet (0.25 mg total) by mouth on Mon/Wed and Fri at Hemodialysis.    Patient not taking: Reported on 12/03/2023   Zoster Vaccine Adjuvanted [facility]) injection 0.5 mL 0    Sig: Inject 0.5 mLs into the muscle once for 1 dose.    Patient not taking: Reported on 12/03/2023    Return in about 2 months (around 02/03/2024).  Jonah Blue, MD, FACP

## 2023-12-03 NOTE — Patient Instructions (Signed)
Started low-dose Xanax of 0.25 mg to take at the start of dialysis every Monday Wednesday and Friday.  Please note that the medication will cause some drowsiness.  It is to be used to control anxiety.  Your diabetes is not well-controlled.  I recommend increasing your insulin from 3 units every evening to 2 units with breakfast and 3 units in the evenings with dinner.  Please bring your insulin pen with you on your next visit for me to see it. Please bring all of your medicines with you on your follow-up appointment.  I will have our caseworker follow-up on getting your home health aide.

## 2023-12-03 NOTE — Progress Notes (Signed)
Paramedicine Encounter    Patient ID: Nancy [name], female    DOB: 03-13-1961, 62 y.o.   MRN: [id number]   Complaints NONE  Assessment A&O x 4, skin W&D w/ good color.  Denies chest pain or SOB.  Lung sounds clear throughout and no edema noted.  Compliance with meds YES  Pill box filled x 1 week  Refills needed NONE  Meds changes since last visit NONE    Social changes NONE   BP (!) 160/90 (BP Location: Left Arm, Patient Position: Sitting, Cuff Size: Normal)   Pulse 76   Resp 16   Wt 242 lb 9.6 oz (110 kg)   SpO2 100%   BMI 42.97 kg/m  Weight yesterday- not taken Last visit weight-   ACTION: Home visit completed  Bethanie Dicker [phone] 12/03/23  Patient Care Team: Marcine Matar, MD as PCP - General (Internal Medicine) Runell Gess, MD as PCP - Cardiology (Cardiology) Laurey Morale, MD as PCP - [facility] (Cardiology[facility], [facility]  Patient Active Problem List   Diagnosis Date Noted   History of CVA (cerebrovascular accident) 03/22/2023   Community acquired pneumonia 03/22/2023   Pain, unspecified 08/15/2022   Complication of vascular dialysis catheter 08/07/2022   Chest pain 05/30/2022   Fluid overload 05/30/2022   Iron deficiency anemia, unspecified 04/26/2022   Nausea 04/10/2022   Hyponatremia 04/04/2022   Prolonged QT interval 04/04/2022   Anaphylactic shock, unspecified, initial encounter 02/23/2022   Other allergy, initial encounter 02/23/2022   Pressure injury of skin 02/19/2022   Anemia in chronic kidney disease 02/16/2022   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) 02/16/2022   Hyperlipidemia, unspecified 02/16/2022   Hypertensive heart and chronic kidney disease with heart failure and with stage 5 chronic kidney disease, or end stage renal disease (HCC) 02/16/2022   Personal history of nicotine dependence 02/16/2022   Personal history of transient ischemic attack (TIA), and cerebral infarction  without residual deficits 02/16/2022   Secondary hyperparathyroidism of renal origin (HCC) 02/16/2022   Renal anasarca 02/14/2022   ESRD on dialysis (HCC) 02/14/2022   Hyperkalemia 02/14/2022   S/P arteriovenous (AV) fistula creation 12/22/2021   Peripheral artery disease (HCC) 10/27/2021   Acute exacerbation of congestive heart failure (HCC) 10/24/2021   Acute cardiogenic pulmonary edema (HCC) 10/06/2021   CAD (coronary artery disease) 10/06/2021   Goals of care, counseling/discussion    Gangrene of left foot (HCC)    Cellulitis of left foot    CHF (congestive heart failure) (HCC) 07/07/2021   Acute postoperative anemia due to expected blood loss superimposed on anemia of chronic renal insufficiency 06/30/2021   CKD (chronic kidney disease), stage IV (HCC) 06/11/2021   Chronic diastolic CHF (congestive heart failure) (HCC) 06/10/2021   Abnormal nuclear stress test    Dyspnea on exertion 03/07/2021   Orthopnea 02/25/2021   History of cerebrovascular accident (CVA) with residual deficit 05/06/2020   AKI (acute kidney injury) (HCC) 04/20/2020   Generalized weakness 04/20/2020   Dehydration 04/20/2020   Generalized abdominal pain    Pancreatitis, acute 02/29/2020   Cerebrovascular accident (CVA) (HCC) 11/08/2019   Stenosis of right carotid artery 11/08/2019   Cortical age-related cataract of both eyes 08/30/2019   Gait abnormality 08/30/2019   Statin declined 08/30/2019   Gastroesophageal reflux disease without esophagitis 04/18/2018   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   Insulin dependent type 2 diabetes mellitus (HCC) 10/29/2017   History of macular degeneration 10/29/2017   Chronic cervical pain 10/29/2016  Myalgia 09/14/2016   Insomnia 09/14/2016   Tinea pedis of both feet 08/20/2016   Macular degeneration 08/17/2016   Low back pain 09/21/2014   Hypertensive urgency 09/21/2014   HTN (hypertension) 09/08/2014   Thyroid nodule 08/17/2014   Morbid obesity (HCC) 08/17/2014    Hypokalemia 08/06/2014   Type II diabetes mellitus with neurological manifestations, uncontrolled 08/04/2014    Current Outpatient Medications:    albuterol (VENTOLIN HFA) 108 (90 Base) MCG/ACT inhaler, Inhale 2 puffs into the lungs every 6 (six) hours as needed., Disp: 18 g, Rfl: 3   amLODipine (NORVASC) 10 MG tablet, Take 1 tablet (10 mg total) by mouth daily for hypertension, Disp: 90 tablet, Rfl: 1   aspirin 81 MG EC tablet, Take 1 tablet (81 mg total) by mouth daily., Disp: 60 tablet, Rfl: 1   atorvastatin (LIPITOR) 40 MG tablet, Take 1 tablet (40 mg total) by mouth daily for antilipids, Disp: 90 tablet, Rfl: 1   ezetimibe (ZETIA) 10 MG tablet, Take 1 tablet (10 mg total) by mouth daily for antilipids, Disp: 90 tablet, Rfl: 1   isosorbide mononitrate (IMDUR) 30 MG 24 hr tablet, Take 2 tablets (60 mg total) by mouth daily for blood pressure, Disp: 180 tablet, Rfl: 1   labetalol (NORMODYNE) 300 MG tablet, Take 1 tablet (300 mg total) by mouth 2 (two) times daily for beta blockers, Disp: 180 tablet, Rfl: 1   levothyroxine (SYNTHROID) 25 MCG tablet, Take 1 tablet (25 mcg total) by mouth daily for low thyroid hormone, Disp: 90 tablet, Rfl: 1   omeprazole (PRILOSEC) 20 MG capsule, Take 2 capsules (40 mg total) by mouth daily for GERD, Disp: 180 capsule, Rfl: 1   ondansetron (ZOFRAN) 8 MG tablet, Take 1 tablet (8 mg total) by mouth every 8 (eight) hours as needed for nausea, Disp: 42 tablet, Rfl: 0   sevelamer carbonate (RENVELA) 800 MG tablet, Take 3 tablets (2,400 mg total) by mouth 3 (three) times daily with meals and 1 tablet with snacks., Disp: 1080 tablet, Rfl: 3   Accu-Chek Softclix Lancets lancets, Use to check blood sugar 3 times daily. (Patient not taking: Reported on 12/03/2023), Disp: 100 each, Rfl: 6   ALPRAZolam (XANAX) 0.25 MG tablet, Take 1 tablet (0.25 mg total) by mouth on Mon/Wed and Fri at Hemodialysis. (Patient not taking: Reported on 12/03/2023), Disp: 12 tablet, Rfl: 1    benzonatate (TESSALON) 100 MG capsule, Take 1 capsule (100 mg total) by mouth 3 (three) times daily as needed for cough. (Patient not taking: Reported on 12/03/2023), Disp: 21 capsule, Rfl: 0   Blood Glucose Monitoring Suppl (ACCU-CHEK GUIDE) w/Device KIT, Use to check blood sugar 3 times daily. (Patient not taking: Reported on 12/03/2023), Disp: 1 kit, Rfl: 0   glucose blood (ACCU-CHEK GUIDE) test strip, Use to check blood sugar 3 times daily. (Patient not taking: Reported on 12/03/2023), Disp: 100 each, Rfl: 6   Insulin Pen Needle 32G X 4 MM MISC, Use to inject insulin as directed. (Patient not taking: Reported on 12/03/2023), Disp: 100 each, Rfl: 4   Multiple Vitamin (MULTI-VITAMIN DAILY) TABS, Take 1 tablet by mouth at bedtime for supplementation (Patient not taking: Reported on 12/03/2023), Disp: 14 tablet, Rfl: 0   polyethylene glycol (MIRALAX / GLYCOLAX) 17 g packet, Give 17 gram by mouth one time a day for constipation (Patient not taking: Reported on 12/03/2023), Disp: 14 packet, Rfl: 0   Zinc 220 (50 Zn) MG CAPS, Take 1 capsule by mouth daily for minerals, electrolytes (Patient  not taking: Reported on 12/03/2023), Disp: 14 capsule, Rfl: 0   Zoster Vaccine Adjuvanted [facility]) injection, Inject 0.5 mLs into the muscle once for 1 dose. (Patient not taking: Reported on 12/03/2023), Disp: 0.5 mL, Rfl: 0 Allergies  Allergen Reactions   Hydralazine Other (See Comments)   Hydralazine Hcl Other (See Comments)    Hair loss   Hydrocodone Itching and Other (See Comments)    Upset stomach   Metformin And Related Nausea And Vomiting and Other (See Comments)    Stomach pains, also   Other Nausea Only and Other (See Comments)    Lettuce- Does not digest this!!   Plaquenil [Hydroxychloroquine Sulfate] Hives   Shellfish Allergy Nausea And Vomiting   Shellfish-Derived Products Nausea Only and Other (See Comments)    Caused an upset stomach   Shrimp (Diagnostic) Nausea Only and Other (See Comments)     Upset stomach    Sulfa Antibiotics Hives     Social History   Socioeconomic History   Marital status: Legally Separated    Spouse name: [name]   Number of children: 1   Years of education: some colle   Highest education level: Not on file  Occupational History   Occupation: Employed FT as Warden/ranger    Comment: [employer] , NA  Tobacco Use   Smoking status: Former    Current packs/day: 0.25    Average packs/day: 0.3 packs/day for 0.5 years (0.1 ttl pk-yrs)    Types: Cigarettes    Passive exposure: Never   Smokeless tobacco: Never   Tobacco comments:    Former smoker 11/03/21  Vaping Use   Vaping status: Never Used  Substance and Sexual Activity   Alcohol use: No   Drug use: No   Sexual activity: Not Currently    Birth control/protection: None  Other Topics Concern   Not on file  Social History Narrative   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    Social Drivers of Corporate investment banker Strain: Low Risk  (10/22/2023)   Overall Financial Resource Strain (CARDIA)    Difficulty of Paying Living Expenses: Not very hard  Food Insecurity: No Food Insecurity (10/22/2023)   Hunger Vital Sign    Worried About Running Out of Food in the Last Year: Never true    Ran Out of Food in the Last Year: Never true  Transportation Needs: No Transportation Needs (10/22/2023)   PRAPARE - Administrator, Civil Service (Medical): No    Lack of Transportation (Non-Medical): No  Physical Activity: Inactive (10/22/2023)   Exercise Vital Sign    Days of Exercise per Week: 0 days    Minutes of Exercise per Session: 0 min  Stress: No Stress Concern Present (10/22/2023)   [employer] of Occupational Health - Occupational Stress Questionnaire    Feeling of Stress : Not at all  Social Connections: Unknown (10/22/2023)   Social Connection and Isolation Panel [NHANES]    Frequency of Communication with Friends and Family: More than three  times a week    Frequency of Social Gatherings with Friends and Family: More than three times a week    Attends Religious Services: Not on file    Active Member of Clubs or Organizations: Yes    Attends Banker Meetings: Not on file    Marital Status: Married  Intimate Partner Violence: Not At Risk (10/22/2023)   Humiliation, Afraid, Rape, and Kick questionnaire    Fear of  Current or Ex-Partner: No    Emotionally Abused: No    Physically Abused: No    Sexually Abused: No    Physical Exam      Future Appointments  Date Time Provider Department Center  02/04/2024 10:10 AM Marcine Matar, MD [facility] None

## 2023-12-04 ENCOUNTER — Other Ambulatory Visit: Payer: Self-pay

## 2023-12-04 ENCOUNTER — Telehealth (HOSPITAL_COMMUNITY): Payer: Self-pay | Admitting: Licensed Clinical Social Worker

## 2023-12-04 ENCOUNTER — Other Ambulatory Visit (HOSPITAL_COMMUNITY): Payer: Self-pay

## 2023-12-04 LAB — TSH: TSH: 3.84 u[IU]/mL (ref 0.450–4.500)

## 2023-12-04 NOTE — Telephone Encounter (Signed)
H&V Care Navigation CSW Progress Note  Clinical Social Worker informed by paramedic that patient cannot safely use a normal scale due to patient being unstable and would benefit from talking scale- CSW able to provide- paramedic will take out to patient directly.   SDOH Screenings   Food Insecurity: No Food Insecurity (10/22/2023)  Housing: Low Risk  (10/22/2023)  Transportation Needs: No Transportation Needs (10/22/2023)  Utilities: Not At Risk (10/22/2023)  Depression (PHQ2-9): Low Risk  (12/03/2023)  Financial Resource Strain: Low Risk  (10/22/2023)  Physical Activity: Inactive (10/22/2023)  Social Connections: Unknown (10/22/2023)  Stress: No Stress Concern Present (10/22/2023)  Tobacco Use: Medium Risk (12/03/2023)  Health Literacy: Adequate Health Literacy (10/22/2023)   Burna Sis, LCSW Clinical Social Worker [facility] Desk#: [phone] Cell#: [phone]

## 2023-12-05 ENCOUNTER — Other Ambulatory Visit (HOSPITAL_COMMUNITY): Payer: Self-pay

## 2023-12-06 ENCOUNTER — Other Ambulatory Visit (HOSPITAL_COMMUNITY): Payer: Self-pay

## 2023-12-09 ENCOUNTER — Other Ambulatory Visit (HOSPITAL_COMMUNITY): Payer: Self-pay | Admitting: Emergency Medicine

## 2023-12-09 ENCOUNTER — Other Ambulatory Visit (HOSPITAL_COMMUNITY): Payer: Self-pay

## 2023-12-09 NOTE — Progress Notes (Unsigned)
Paramedicine Encounter    Patient ID: Nancy [name], female    DOB: February 25, 1961, 62 y.o.   MRN: [id number]   Complaints***  Assessment***  Compliance with meds***  Pill box filled***  Refills needed***  Meds changes since last visit***    Social changes***   BP 130/80 (BP Location: Left Arm, Patient Position: Sitting, Cuff Size: Normal)   Pulse 80   Resp 14   Wt 217 lb 3.2 oz (98.5 kg)   SpO2 96%   BMI 38.48 kg/m  Weight yesterday-*** Last visit weight-***  Tues/Sat dialysis ACTION: {Paramed Action:[id number]}  Beatrix Shipper, EMT-Paramedic [phone] 12/09/23  Patient Care Team: Marcine Matar, MD as PCP - General (Internal Medicine) Runell Gess, MD as PCP - Cardiology (Cardiology) Laurey Morale, MD as PCP - Advanced [facility] (Cardiology) Center, [facility]  Patient Active Problem List   Diagnosis Date Noted  . History of CVA (cerebrovascular accident) 03/22/2023  . Community acquired pneumonia 03/22/2023  . Pain, unspecified 08/15/2022  . Complication of vascular dialysis catheter 08/07/2022  . Chest pain 05/30/2022  . Fluid overload 05/30/2022  . Iron deficiency anemia, unspecified 04/26/2022  . Nausea 04/10/2022  . Hyponatremia 04/04/2022  . Prolonged QT interval 04/04/2022  . Anaphylactic shock, unspecified, initial encounter 02/23/2022  . Other allergy, initial encounter 02/23/2022  . Pressure injury of skin 02/19/2022  . Anemia in chronic kidney disease 02/16/2022  . 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 Hyperlipidemia, unspecified 02/16/2022  . Hypertensive heart and chronic kidney disease with heart failure and with stage 5 chronic kidney disease, or end stage renal disease (HCC) 02/16/2022  . Personal history of nicotine dependence 02/16/2022  . Personal history of transient ischemic attack (TIA), and cerebral infarction without residual deficits 02/16/2022  . Secondary hyperparathyroidism of renal  origin (HCC) 02/16/2022  . Renal anasarca 02/14/2022  . ESRD on dialysis (HCC) 02/14/2022  . Hyperkalemia 02/14/2022  . S/P arteriovenous (AV) fistula creation 12/22/2021  . Peripheral artery disease (HCC) 10/27/2021  . Acute exacerbation of congestive heart failure (HCC) 10/24/2021  . Acute cardiogenic pulmonary edema (HCC) 10/06/2021  . CAD (coronary artery disease) 10/06/2021  . Goals of care, counseling/discussion   . Gangrene of left foot (HCC)   . Cellulitis of left foot   . CHF (congestive heart failure) (HCC) 07/07/2021  . Acute postoperative anemia due to expected blood loss superimposed on anemia of chronic renal insufficiency 06/30/2021  . CKD (chronic kidney disease), stage IV (HCC) 06/11/2021  . Chronic diastolic CHF (congestive heart failure) (HCC) 06/10/2021  . Abnormal nuclear stress test   . Dyspnea on exertion 03/07/2021  . Orthopnea 02/25/2021  . History of cerebrovascular accident (CVA) with residual deficit 05/06/2020  . AKI (acute kidney injury) (HCC) 04/20/2020  . Generalized weakness 04/20/2020  . Dehydration 04/20/2020  . Generalized abdominal pain   . Pancreatitis, acute 02/29/2020  . Cerebrovascular accident (CVA) (HCC) 11/08/2019  . Stenosis of right carotid artery 11/08/2019  . Cortical age-related cataract of both eyes 08/30/2019  . Gait abnormality 08/30/2019  . Statin declined 08/30/2019  . Gastroesophageal reflux disease without esophagitis 04/18/2018  . 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 Insulin dependent type 2 diabetes mellitus (HCC) 10/29/2017  . History of macular degeneration 10/29/2017  . Chronic cervical pain 10/29/2016  . Myalgia 09/14/2016  . Insomnia 09/14/2016  . Tinea pedis of both feet 08/20/2016  . Macular degeneration 08/17/2016  . Low back pain 09/21/2014  . Hypertensive urgency 09/21/2014  . HTN (hypertension)  09/08/2014  . Thyroid nodule 08/17/2014  . Morbid obesity (HCC) 08/17/2014  . Hypokalemia 08/06/2014  . Type II diabetes  mellitus with neurological manifestations, uncontrolled 08/04/2014    Current Outpatient Medications:  .  Accu-Chek Softclix Lancets lancets, Use to check blood sugar 3 times daily. (Patient not taking: Reported on 12/09/2023), Disp: 100 each, Rfl: 6 .  albuterol (VENTOLIN HFA) 108 (90 Base) MCG/ACT inhaler, Inhale 2 puffs into the lungs every 6 (six) hours as needed., Disp: 18 g, Rfl: 3 .  ALPRAZolam (XANAX) 0.25 MG tablet, Take 1 tablet (0.25 mg total) by mouth on Mon/Wed and Fri at Hemodialysis., Disp: 12 tablet, Rfl: 1 .  amLODipine (NORVASC) 10 MG tablet, Take 1 tablet (10 mg total) by mouth daily for hypertension, Disp: 90 tablet, Rfl: 1 .  aspirin 81 MG EC tablet, Take 1 tablet (81 mg total) by mouth daily., Disp: 60 tablet, Rfl: 1 .  atorvastatin (LIPITOR) 40 MG tablet, Take 1 tablet (40 mg total) by mouth daily for antilipids, Disp: 90 tablet, Rfl: 1 .  benzonatate (TESSALON) 100 MG capsule, Take 1 capsule (100 mg total) by mouth 3 (three) times daily as needed for cough. (Patient not taking: Reported on 12/09/2023), Disp: 21 capsule, Rfl: 0 .  Blood Glucose Monitoring Suppl (ACCU-CHEK GUIDE) w/Device KIT, Use to check blood sugar 3 times daily. (Patient not taking: Reported on 12/09/2023), Disp: 1 kit, Rfl: 0 .  ezetimibe (ZETIA) 10 MG tablet, Take 1 tablet (10 mg total) by mouth daily for antilipids, Disp: 90 tablet, Rfl: 1 .  glucose blood (ACCU-CHEK GUIDE) test strip, Use to check blood sugar 3 times daily. (Patient not taking: Reported on 12/09/2023), Disp: 100 each, Rfl: 6 .  Insulin Pen Needle 32G X 4 MM MISC, Use to inject insulin as directed. (Patient not taking: Reported on 12/03/2023), Disp: 100 each, Rfl: 4 .  isosorbide mononitrate (IMDUR) 30 MG 24 hr tablet, Take 2 tablets (60 mg total) by mouth daily for blood pressure, Disp: 180 tablet, Rfl: 1 .  labetalol (NORMODYNE) 300 MG tablet, Take 1 tablet (300 mg total) by mouth 2 (two) times daily for beta blockers, Disp: 180 tablet,  Rfl: 1 .  levothyroxine (SYNTHROID) 25 MCG tablet, Take 1 tablet (25 mcg total) by mouth daily for low thyroid hormone, Disp: 90 tablet, Rfl: 1 .  Multiple Vitamin (MULTI-VITAMIN DAILY) TABS, Take 1 tablet by mouth at bedtime for supplementation (Patient not taking: Reported on 12/09/2023), Disp: 14 tablet, Rfl: 0 .  omeprazole (PRILOSEC) 20 MG capsule, Take 2 capsules (40 mg total) by mouth daily for GERD, Disp: 180 capsule, Rfl: 1 .  ondansetron (ZOFRAN) 8 MG tablet, Take 1 tablet (8 mg total) by mouth every 8 (eight) hours as needed for nausea, Disp: 42 tablet, Rfl: 0 .  polyethylene glycol (MIRALAX / GLYCOLAX) 17 g packet, Give 17 gram by mouth one time a day for constipation (Patient not taking: Reported on 12/03/2023), Disp: 14 packet, Rfl: 0 .  sevelamer carbonate (RENVELA) 800 MG tablet, Take 3 tablets (2,400 mg total) by mouth 3 (three) times daily with meals and 1 tablet with snacks., Disp: 1080 tablet, Rfl: 3 .  Zinc 220 (50 Zn) MG CAPS, Take 1 capsule by mouth daily for minerals, electrolytes (Patient not taking: Reported on 12/03/2023), Disp: 14 capsule, Rfl: 0 Allergies  Allergen Reactions  . Hydralazine Other (See Comments)  . Hydralazine Hcl Other (See Comments)    Hair loss  . Hydrocodone Itching and Other (See Comments)  Upset stomach  . Metformin And Related Nausea And Vomiting and Other (See Comments)    Stomach pains, also  . Other Nausea Only and Other (See Comments)    Lettuce- Does not digest this!!  . Plaquenil [Hydroxychloroquine Sulfate] Hives  . Shellfish Allergy Nausea And Vomiting  . Shellfish-Derived Products Nausea Only and Other (See Comments)    Caused an upset stomach  . Shrimp (Diagnostic) Nausea Only and Other (See Comments)    Upset stomach   . Sulfa Antibiotics Hives     Social History   Socioeconomic History  . Marital status: Legally Separated    Spouse name: [name]  . Number of children: 1  . Years of education: some colle  . Highest  education level: Not on file  Occupational History  . Occupation: Employed FT as Warden/ranger    Comment: [employer] , NA  Tobacco Use  . Smoking status: Former    Current packs/day: 0.25    Average packs/day: 0.3 packs/day for 0.5 years (0.1 ttl pk-yrs)    Types: Cigarettes    Passive exposure: Never  . Smokeless tobacco: Never  . Tobacco comments:    Former smoker 11/03/21  Vaping Use  . Vaping status: Never Used  Substance and Sexual Activity  . Alcohol use: No  . Drug use: No  . Sexual activity: Not Currently    Birth control/protection: None  Other Topics Concern  . 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 Difficulty of Paying Living Expenses: Not very hard  Food Insecurity: No Food Insecurity (10/22/2023)   Hunger Vital Sign   . Worried About Programme researcher, broadcasting/film/video in the Last Year: Never true   . Ran Out of Food in the Last Year: Never true  Transportation Needs: No Transportation Needs (10/22/2023)   PRAPARE - Transportation   . Lack of Transportation (Medical): No   . Lack of Transportation (Non-Medical): No  Physical Activity: Inactive (10/22/2023)   Exercise Vital Sign   . Days of Exercise per Week: 0 days   . Minutes of Exercise per Session: 0 min  Stress: No Stress Concern Present (10/22/2023)   [employer] of Occupational Health - Occupational Stress Questionnaire   . Feeling of Stress : Not at all  Social Connections: Unknown (10/22/2023)   Social Connection and Isolation Panel [NHANES]   . Frequency of Communication with Friends and Family: More than three times a week   . Frequency of Social Gatherings with Friends and Family: More than three times a week   . Attends Religious Services: Not on file   . Active Member of Clubs or Organizations: Yes   .  Attends Banker Meetings: Not on file   . Marital Status: Married  Catering manager Violence: Not At Risk (10/22/2023)   Humiliation, Afraid, Rape, and Kick questionnaire   . Fear of Current or Ex-Partner: No   . Emotionally Abused: No   . Physically Abused: No   . Sexually Abused: No    Physical Exam      Future Appointments  Date Time Provider Department Center  02/04/2024 10:10 AM Marcine Matar, MD [facility] None

## 2023-12-10 ENCOUNTER — Other Ambulatory Visit (HOSPITAL_COMMUNITY): Payer: Self-pay

## 2023-12-10 ENCOUNTER — Telehealth: Payer: Self-pay

## 2023-12-10 NOTE — Telephone Encounter (Signed)
Message received from Dr Laural Benes stating the patient  claims someone came out and did the evaluation for PCS  but they never received a call from a [facility] agency.   I tried to reach [facility]: [phone] to check on the status of the PCS referral but their office was closed for the holiday.  I will need to call back after the holiday

## 2023-12-12 NOTE — Telephone Encounter (Signed)
I tried to [employer] again today and the office was still closed for the holiday.

## 2023-12-16 NOTE — Telephone Encounter (Signed)
I spoke to Mark[place]/ [place] [facility] and she confirmed that the PCS eval was done, the patient picked an agency and then the agency declined the referral.  She said they have been trying to reach the patient since November to pick another agency.  She stated that the patient just needs to call [place] [facility].  I called the patient and explained my conversation with [place] [facility] and she said she will be able to call the agency but she did not have a pen/paper to take the number.  I told her to hang up and I will call back and leave the number on her voicemail. She was in agreement and I called back and left the number for [place] [facility] as discussed.

## 2023-12-17 ENCOUNTER — Telehealth: Payer: Self-pay | Admitting: Internal Medicine

## 2023-12-17 ENCOUNTER — Telehealth (HOSPITAL_COMMUNITY): Payer: Self-pay

## 2023-12-17 ENCOUNTER — Other Ambulatory Visit (HOSPITAL_COMMUNITY): Payer: Self-pay

## 2023-12-17 NOTE — Progress Notes (Signed)
 Paramedicine Encounter    Patient ID: Nancy [name], female    DOB: 1961/07/12, 62 y.o.   MRN: [id number]    Arrived for home visit for med rec to follow up on Nancy [name] behalf as she is out today. Patient reports that she is feeling well and had dialysis earlier today. She reports that she has been taking her medication as prescribed. I reviewed medication list and noted that insulin  was not on her list however Dr. Vicci and patient discussed that patient admits she has been taking 3 units of humalog  however she showed me what she has been taking and it appears she has been taking 3 units of Lantus  twice daily not humalog  for several months. She has three expired boxes of the following in the home- lantus , humalog , novolog . The lantus  she has been taking is also expired.    She says that she has needed a glucometer but has not received one. She also was trying to get a dexcom but was told she couldn't because she wasn't on insulin  but she is actively taking it despite it being on her medication list. Dr. Vicci recommended the 3 units of humalog  but no prescription was sent in for same.   I recommend patient be seen by pharmacist clinic ASAP to reconcile this issue due to uncertainty of her dosing's and fact that A1C is elevated and she currently has no way of checking her CBG in the home.   I advise patient to hold any insulin  (as hers in the home is expired) and we are unsure what she is truly to be taking.   I will forward to Nancy [name] and Dr. Vicci for follow up concerning same.   Nancy [name], EMT-Paramedic [phone] 12/17/2023         ACTION: Home visit completed     Patient Care Team: [name], MD as PCP - General (Internal Medicine) Court Dorn PARAS, MD as PCP - Cardiology (Cardiology) Rolan Ezra RAMAN, MD as PCP - Advanced Heart Failure (Cardiology) Center, [facility]  Patient Active Problem List   Diagnosis Date Noted   History of CVA  (cerebrovascular accident) 03/22/2023   Community acquired pneumonia 03/22/2023   Pain, unspecified 08/15/2022   Complication of vascular dialysis catheter 08/07/2022   Chest pain 05/30/2022   Fluid overload 05/30/2022   Iron  deficiency anemia, unspecified 04/26/2022   Nausea 04/10/2022   Hyponatremia 04/04/2022   Prolonged QT interval 04/04/2022   Anaphylactic shock, unspecified, initial encounter 02/23/2022   Other allergy, initial encounter 02/23/2022   Pressure injury of skin 02/19/2022   Anemia in chronic kidney disease 02/16/2022   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) 02/16/2022   Hyperlipidemia, unspecified 02/16/2022   Hypertensive heart and chronic kidney disease with heart failure and with stage 5 chronic kidney disease, or end stage renal disease (HCC) 02/16/2022   Personal history of nicotine dependence 02/16/2022   Personal history of transient ischemic attack (TIA), and cerebral infarction without residual deficits 02/16/2022   Secondary hyperparathyroidism of renal origin (HCC) 02/16/2022   Renal anasarca 02/14/2022   ESRD on dialysis (HCC) 02/14/2022   Hyperkalemia 02/14/2022   S/P arteriovenous (AV) fistula creation 12/22/2021   Peripheral artery disease (HCC) 10/27/2021   Acute exacerbation of congestive heart failure (HCC) 10/24/2021   Acute cardiogenic pulmonary edema (HCC) 10/06/2021   CAD (coronary artery disease) 10/06/2021   Goals of care, counseling/discussion    Gangrene of left foot (HCC)    Cellulitis of left foot  CHF (congestive heart failure) (HCC) 07/07/2021   Acute postoperative anemia due to expected blood loss superimposed on anemia of chronic renal insufficiency 06/30/2021   CKD (chronic kidney disease), stage IV (HCC) 06/11/2021   Chronic diastolic CHF (congestive heart failure) (HCC) 06/10/2021   Abnormal nuclear stress test    Dyspnea on exertion 03/07/2021   Orthopnea 02/25/2021   History of cerebrovascular accident (CVA) with residual  deficit 05/06/2020   AKI (acute kidney injury) (HCC) 04/20/2020   Generalized weakness 04/20/2020   Dehydration 04/20/2020   Generalized abdominal pain    Pancreatitis, acute 02/29/2020   Cerebrovascular accident (CVA) (HCC) 11/08/2019   Stenosis of right carotid artery 11/08/2019   Cortical age-related cataract of both eyes 08/30/2019   Gait abnormality 08/30/2019   Statin declined 08/30/2019   Gastroesophageal reflux disease without esophagitis 04/18/2018   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   Insulin  dependent type 2 diabetes mellitus (HCC) 10/29/2017   History of macular degeneration 10/29/2017   Chronic cervical pain 10/29/2016   Myalgia 09/14/2016   Insomnia 09/14/2016   Tinea pedis of both feet 08/20/2016   Macular degeneration 08/17/2016   Low back pain 09/21/2014   Hypertensive urgency 09/21/2014   HTN (hypertension) 09/08/2014   Thyroid  nodule 08/17/2014   Morbid obesity (HCC) 08/17/2014   Hypokalemia 08/06/2014   Type II diabetes mellitus with neurological manifestations, uncontrolled 08/04/2014    Current Outpatient Medications:    Accu-Chek Softclix Lancets lancets, Use to check blood sugar 3 times daily. (Patient not taking: Reported on 12/09/2023), Disp: 100 each, Rfl: 6   albuterol  (VENTOLIN  HFA) 108 (90 Base) MCG/ACT inhaler, Inhale 2 puffs into the lungs every 6 (six) hours as needed., Disp: 18 g, Rfl: 3   ALPRAZolam  (XANAX ) 0.25 MG tablet, Take 1 tablet (0.25 mg total) by mouth on Mon/Wed and Fri at Hemodialysis., Disp: 12 tablet, Rfl: 1   amLODipine  (NORVASC ) 10 MG tablet, Take 1 tablet (10 mg total) by mouth daily for hypertension, Disp: 90 tablet, Rfl: 1   aspirin  81 MG EC tablet, Take 1 tablet (81 mg total) by mouth daily., Disp: 60 tablet, Rfl: 1   atorvastatin  (LIPITOR) 40 MG tablet, Take 1 tablet (40 mg total) by mouth daily for antilipids, Disp: 90 tablet, Rfl: 1   benzonatate  (TESSALON ) 100 MG capsule, Take 1 capsule (100 mg total) by mouth 3 (three) times daily  as needed for cough. (Patient not taking: Reported on 12/09/2023), Disp: 21 capsule, Rfl: 0   Blood Glucose Monitoring Suppl (ACCU-CHEK GUIDE) w/Device KIT, Use to check blood sugar 3 times daily. (Patient not taking: Reported on 12/09/2023), Disp: 1 kit, Rfl: 0   ezetimibe  (ZETIA ) 10 MG tablet, Take 1 tablet (10 mg total) by mouth daily for antilipids, Disp: 90 tablet, Rfl: 1   glucose blood (ACCU-CHEK GUIDE) test strip, Use to check blood sugar 3 times daily. (Patient not taking: Reported on 12/09/2023), Disp: 100 each, Rfl: 6   Insulin  Pen Needle 32G X 4 MM MISC, Use to inject insulin  as directed. (Patient not taking: Reported on 12/03/2023), Disp: 100 each, Rfl: 4   isosorbide  mononitrate (IMDUR ) 30 MG 24 hr tablet, Take 2 tablets (60 mg total) by mouth daily for blood pressure, Disp: 180 tablet, Rfl: 1   labetalol  (NORMODYNE ) 300 MG tablet, Take 1 tablet (300 mg total) by mouth 2 (two) times daily for beta blockers, Disp: 180 tablet, Rfl: 1   levothyroxine  (SYNTHROID ) 25 MCG tablet, Take 1 tablet (25 mcg total) by mouth daily for low thyroid   hormone, Disp: 90 tablet, Rfl: 1   Multiple Vitamin (MULTI-VITAMIN DAILY) TABS, Take 1 tablet by mouth at bedtime for supplementation (Patient not taking: Reported on 12/09/2023), Disp: 14 tablet, Rfl: 0   omeprazole  (PRILOSEC) 20 MG capsule, Take 2 capsules (40 mg total) by mouth daily for GERD, Disp: 180 capsule, Rfl: 1   ondansetron  (ZOFRAN ) 8 MG tablet, Take 1 tablet (8 mg total) by mouth every 8 (eight) hours as needed for nausea, Disp: 42 tablet, Rfl: 0   polyethylene glycol (MIRALAX  / GLYCOLAX ) 17 g packet, Give 17 gram by mouth one time a day for constipation (Patient not taking: Reported on 12/03/2023), Disp: 14 packet, Rfl: 0   sevelamer  carbonate (RENVELA ) 800 MG tablet, Take 3 tablets (2,400 mg total) by mouth 3 (three) times daily with meals and 1 tablet with snacks., Disp: 1080 tablet, Rfl: 3   Zinc  220 (50 Zn) MG CAPS, Take 1 capsule by mouth  daily for minerals, electrolytes (Patient not taking: Reported on 12/03/2023), Disp: 14 capsule, Rfl: 0 Allergies  Allergen Reactions   Hydralazine  Other (See Comments)   Hydralazine  Hcl Other (See Comments)    Hair loss   Hydrocodone Itching and Other (See Comments)    Upset stomach   Metformin  And Related Nausea And Vomiting and Other (See Comments)    Stomach pains, also   Other Nausea Only and Other (See Comments)    Lettuce- Does not digest this!!   Plaquenil [Hydroxychloroquine Sulfate] Hives   Shellfish Allergy Nausea And Vomiting   Shellfish-Derived Products Nausea Only and Other (See Comments)    Caused an upset stomach   Shrimp (Diagnostic) Nausea Only and Other (See Comments)    Upset stomach    Sulfa Antibiotics Hives     Social History   Socioeconomic History   Marital status: Legally Separated    Spouse name: Lamar   Number of children: 1   Years of education: some colle   Highest education level: Not on file  Occupational History   Occupation: Employed FT as warden/ranger    Comment: [employer] , NA  Tobacco Use   Smoking status: Former    Current packs/day: 0.25    Average packs/day: 0.3 packs/day for 0.5 years (0.1 ttl pk-yrs)    Types: Cigarettes    Passive exposure: Never   Smokeless tobacco: Never   Tobacco comments:    Former smoker 11/03/21  Vaping Use   Vaping status: Never Used  Substance and Sexual Activity   Alcohol use: No   Drug use: No   Sexual activity: Not Currently    Birth control/protection: None  Other Topics Concern   Not on file  Social History Narrative   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    Social Drivers of Corporate Investment Banker Strain: Low Risk  (10/22/2023)   Overall Financial Resource Strain (CARDIA)    Difficulty of Paying Living Expenses: Not very hard  Food Insecurity: No Food Insecurity (10/22/2023)   Hunger Vital Sign    Worried About Running Out of Food in the Last  Year: Never true    Ran Out of Food in the Last Year: Never true  Transportation Needs: No Transportation Needs (10/22/2023)   PRAPARE - Administrator, Civil Service (Medical): No    Lack of Transportation (Non-Medical): No  Physical Activity: Inactive (10/22/2023)   Exercise Vital Sign    Days of Exercise per Week: 0 days  Minutes of Exercise per Session: 0 min  Stress: No Stress Concern Present (10/22/2023)   [employer] of Occupational Health - Occupational Stress Questionnaire    Feeling of Stress : Not at all  Social Connections: Unknown (10/22/2023)   Social Connection and Isolation Panel [NHANES]    Frequency of Communication with Friends and Family: More than three times a week    Frequency of Social Gatherings with Friends and Family: More than three times a week    Attends Religious Services: Not on file    Active Member of Clubs or Organizations: Yes    Attends Banker Meetings: Not on file    Marital Status: Married  Intimate Partner Violence: Not At Risk (10/22/2023)   Humiliation, Afraid, Rape, and Kick questionnaire    Fear of Current or Ex-Partner: No    Emotionally Abused: No    Physically Abused: No    Sexually Abused: No    Physical Exam      Future Appointments  Date Time Provider Department Center  02/04/2024 10:10 AM [name], MD [facility] None

## 2023-12-17 NOTE — Telephone Encounter (Signed)
  Arrived for home visit for med rec to follow up on [name] behalf as she is out today. Patient reports that she is feeling well and had dialysis earlier today. She reports that she has been taking her medication as prescribed. I reviewed medication list and noted that insulin  was not on her list however Dr. Vicci and patient discussed that patient admits she has been taking 3 units of humalog  however she showed me what she has been taking and it appears she has been taking 3 units of Lantus  twice daily not humalog  for several months. She has three expired boxes of the following in the home- lantus , humalog , novolog . The lantus  she has been taking is also expired.    She says that she has needed a glucometer but has not received one. She also was trying to get a dexcom but was told she couldn't because she wasn't on insulin  but she is actively taking it despite it being on her medication list. Dr. Vicci recommended the 3 units of humalog  but no prescription was sent in for same.   I recommend patient be seen by pharmacist clinic ASAP to reconcile this issue due to uncertainty of her dosing's and fact that A1C is elevated and she currently has no way of checking her CBG in the home.   I advise patient to hold any insulin  (as hers in the home is expired) and we are unsure what she is truly to be taking.   I will forward to [name] and Dr. Vicci for follow up concerning same.   Powell Mirza, EMT-Paramedic [phone] 12/17/2023

## 2023-12-17 NOTE — Telephone Encounter (Signed)
 Copied from CRM [id number]. Topic: General - Other >> Dec 16, 2023 12:39 PM Santiya F wrote: Reason for CRM: Jaycee with [employer] is calling in because they sent over a fax for a request of an order for oxygen on 12/12/23. Jaycee says they haven't heard anything back and wanted to make sure that the paperwork was received.  fax:[phone] , phone: [phone]

## 2023-12-19 ENCOUNTER — Telehealth: Payer: Self-pay

## 2023-12-19 NOTE — Telephone Encounter (Signed)
 Will defer to when Dr. Laural Benes is back in the office next week regarding if she would like to keep her on Lantus or Humalog 70/30.

## 2023-12-19 NOTE — Telephone Encounter (Signed)
 Provider is currently out of the office. Awaiting for her return. Forms were received and placed on provider box for signature.

## 2023-12-19 NOTE — Telephone Encounter (Signed)
 Copied from CRM [id number]. Topic: General - Other >> Dec 19, 2023  2:53 PM Victoria B wrote: Reason for CRM: Ronnel from [facility] [employer], called in about if request for medical records have been received for patients Oxygen. Please cb. He states this was sent last week

## 2023-12-21 ENCOUNTER — Other Ambulatory Visit: Payer: Self-pay

## 2023-12-21 MED ORDER — PEN NEEDLES 31G X 8 MM MISC
Freq: Every day | 6 refills | Status: DC
Start: 2023-12-21 — End: 2024-08-10
  Filled 2023-12-21: qty 100, 90d supply, fill #0

## 2023-12-21 MED ORDER — FREESTYLE LIBRE 3 READER DEVI
0 refills | Status: DC
  Filled 2023-12-21: qty 1, 30d supply, fill #0

## 2023-12-21 MED ORDER — FREESTYLE LIBRE 3 PLUS SENSOR MISC
11 refills | Status: DC
  Filled 2023-12-21: qty 2, 30d supply, fill #0
  Filled 2023-12-23: qty 2, 28d supply, fill #0
  Filled 2023-12-24: qty 2, 30d supply, fill #0
  Filled 2023-12-31: qty 2, 28d supply, fill #0
  Filled 2024-01-02: qty 2, 30d supply, fill #0

## 2023-12-21 MED ORDER — LANTUS SOLOSTAR 100 UNIT/ML ~~LOC~~ SOPN
3.0000 [IU] | PEN_INJECTOR | Freq: Two times a day (BID) | SUBCUTANEOUS | 99 refills | Status: DC
  Filled 2023-12-21: qty 3, 28d supply, fill #0
  Filled 2024-01-14: qty 3, 28d supply, fill #1
  Filled 2024-02-11: qty 3, 28d supply, fill #2
  Filled 2024-03-10: qty 3, 28d supply, fill #3
  Filled 2024-04-07: qty 3, 28d supply, fill #4
  Filled 2024-05-08: qty 3, 28d supply, fill #5
  Filled 2024-06-05: qty 3, 28d supply, fill #6

## 2023-12-21 NOTE — Addendum Note (Signed)
 Addended by: Jonah Blue B on: 12/21/2023 10:23 AM   Modules accepted: Orders

## 2023-12-23 ENCOUNTER — Encounter (HOSPITAL_COMMUNITY): Payer: Self-pay

## 2023-12-23 ENCOUNTER — Other Ambulatory Visit: Payer: Self-pay

## 2023-12-23 ENCOUNTER — Telehealth (HOSPITAL_COMMUNITY): Payer: Self-pay

## 2023-12-23 ENCOUNTER — Other Ambulatory Visit (HOSPITAL_COMMUNITY): Payer: Self-pay

## 2023-12-23 NOTE — Progress Notes (Signed)
 Anesthesia Chart Review: Nancy [name]  Case: [id number] Date/Time: 12/24/23 0715   Procedure: LEFT ARM ARTERIOVENOUS (AV) FISTULA CREATION (Left)   Anesthesia type: Choice   Pre-op  diagnosis: ESRD   [facility] / [facility]   Surgeons: Lanis Fonda BRAVO, MD       DISCUSSION: Patient is a 63 year old female scheduled for the above procedure. She needs permanent hemodialysis access. Previous right brachiocephalic AVF and right brachiobasilic AVF failed.    History includes former smoker, HTN, HLD, IDDM (with peripheral neuropathy, nephropathy), ESRD (HD initiated 01/18/22), CAD (non-obstructive 04/2021), chronic diastolic CHF, murmur (no valvular stenosis or regurgitation 10/06/21 echo), PAD (s/p bilateral BKA for gangrene 04/04/22), dyspnea, carotid artery disease (right ICA occlusion, 40-59% left ICA 02/2021), anemia, IBS, gastroparesis, CVA (2020), thyroid  nodule (2004), hard of hearing, TAH (10/22/02), cholecystectomy (03/05/20 for gallstone pancreatitis).     Last HF cardiology visit was on 10/08/23 with Fanny Raisin, FNP. Overall filling fine and tolerating HD MWF with occasional low blood pressures but able to complete treatments. Ambulating with bilateral BKA prostheses. Using O2 at 2L with activity. No chest pain. Compliant with medications. Also followed by paramedicine. Was awaiting AVF creation. Follow-up six month with plans to update echo at next visit.   Anesthesia team to evaluate on the day of surgery. She is for iSTAT on arrival.    VS:  BP Readings from Last 3 Encounters:  12/09/23 130/80  12/03/23 (!) 160/90  12/03/23 119/67   Pulse Readings from Last 3 Encounters:  12/09/23 80  12/03/23 76  12/03/23 79     PROVIDERS: [name], MD is PCP  Court Carrier, MD is primary cardiologist  Rolan Barrack, MD is HF cardiologist  Gearline Norris, MD is nephrologist    LABS: For day of surgery. A1c 7.9% on 12/03/23. HGB 10.8 on 12/17/23 at [facility].     IMAGES: CXR 07/24/23: FINDINGS: - Lines/tubes: Left internal jugular venous catheter tip projects over the right atrium. - Lungs: No focal consolidations. - Pleura: No pneumothorax or pleural effusion. - Heart/mediastinum: The heart size and mediastinal contours are within normal limits. - Bones: No acute osseous abnormality. IMPRESSION: No active cardiopulmonary disease.    EKG: EKG 07/24/23: Normal sinus rhythm Left axis deviation Moderate voltage criteria for LVH, may be normal variant ( R in aVL , Cornell product ) T wave abnormality, consider lateral ischemia Abnormal ECG   CV: Echo 10/24/23: IMPRESSIONS   1. Left ventricular ejection fraction, by estimation, is 55 to 60%. The  left ventricle has normal function. The left ventricle has no regional  wall motion abnormalities. Left ventricular diastolic parameters are  indeterminate.   2. Right ventricular systolic function is normal. The right ventricular  size is normal. Tricuspid regurgitation signal is inadequate for assessing  PA pressure.   3. The mitral valve is normal in structure. No evidence of mitral valve  regurgitation. No evidence of mitral stenosis.   4. The aortic valve was not well visualized. Aortic valve regurgitation  is not visualized. No aortic stenosis is present.   5. The inferior vena cava is normal in size with greater than 50%  respiratory variability, suggesting right atrial pressure of 3 mmHg.  - Comparison(s): A prior study was performed on 11/17/2019. No significant  change from prior study.     RHC 07/14/21: 1. Elevated right and left heart filling pressures.  2. Prominent v-wave in PCWP tracing.  3. Primarily pulmonary venous hypertension.  4.  Preserved cardiac output.      NM Cardiac Amyloid Scan 07/12/21: IMPRESSION: Visual and quantitative assessment (grade 1, H/CLL equal 1.0) are equivocal suggestive of transthyretin amyloidosis.  - Per Dr. Rolan, PYP scan not suggestive  of TTR cardiac amyloidosis. 07/11/21 urine immunofixation unremarkable;  07/11/21 myeloma panel showed elevated IgA of 469 and polyclonal increase detected in one or more immunoglobulins with protein electrophoresis scan to follow.     RHC/LHC 04/27/21 Junnie, Dorn, MD): Ost LAD to Prox LAD lesion is 40% stenosed. Mid LAD lesion is 50% stenosed. Mid LAD to Dist LAD lesion is 40% stenosed. Hemodynamic findings consistent with pulmonary hypertension. IMPRESSION: Ms. Nachreiner has pulmonary hypertension with high LVEDP and right atrial pressure as well as wedge.  She has mild to moderate segmental proximal mid and distal LAD disease with out a focal culprit lesion.  Is hard to tell whether her Myoview  was truly positive or real represented breast attenuation artifact but in any event there is nothing that requires revascularization.  Her circumflex and right coronary artery were free of significant disease.  She will need medical therapy for her pulmonary hypertension including diuresis and vasodilators.SABRASABRAI will arrange for her to see one of the advanced heart failure MDs and the heart failure clinic.        Nuclear stress test 04/21/21: Lexiscan  stress is electricallly negative for ischemia Myoview  scan shows smal basal anterior defect, base/mid anterolateral defect and basal inferior defect that partially improves consistent with scar and ischemia,; cannot exclude coexistent soft tissue attenuation (breast, shifting breast) LVEF calculated at 46% with lateral hypokinesis OVerall intermediate risk scan Note: patient scheduled for L heart catheterization to define anatomy     US  Carotid 03/10/21: Summary:  - Right Carotid: Evidence consistent with a total occlusion of the right ICA. Known mid and distal ICA occlusion.  - Left Carotid: Velocities in the left ICA are consistent with a 40-59%  stenosis. Essentially stable LICA velocities. Minimum plaque noted; elevated velocities throughout possibly  due to contralateral occlusion.  - Vertebrals:  Bilateral vertebral arteries demonstrate antegrade flow.  - Subclavians: Normal flow hemodynamics were seen in bilateral subclavian arteries.    Past Medical History:  Diagnosis Date   Anemia 2006   Anginal pain (HCC)    Carotid artery disease (HCC)    right ICA occlusion, 40-59% LICA 02/2021 US    CHF (congestive heart failure) (HCC)    Coronary artery disease    Depression 2014   previously on amitryptiline    Diabetes mellitus with neurological manifestation (HCC) 2006   Diabetic peripheral neuropathy (HCC) 04/26/2020   Dysphagia    Dyspnea    ESRD on hemodialysis (HCC)    Fracture of left ankle 1997   Gastroparesis 07/2016   Heart murmur    HOH (hard of hearing) 2004   Hyperlipidemia 2006   Hypertension 2006   IBS (irritable bowel syndrome) 2002   Leukopenia 2015   Macular degeneration 11/2019   Peripheral vascular disease (HCC)    Shingles 2009   Stroke [facility]) 2020   Thyroid  nodule 2004    Past Surgical History:  Procedure Laterality Date   A/V FISTULAGRAM Right 09/25/2022   Procedure: A/V Fistulagram;  Surgeon: Serene Gaile ORN, MD;  [facility];  Service: Cardiovascular;  Laterality: Right;   ABDOMINAL HYSTERECTOMY  2005   AMPUTATION Bilateral 04/04/2022   Procedure: BILATERAL BELOW KNEE AMPUTATION;  Surgeon: Harden Jerona GAILS, MD;  Location: [facility] OR;  Service: Orthopedics;  Laterality: Bilateral;  AV FISTULA PLACEMENT Right 12/22/2021   Procedure: RIGHT ARM ARTERIOVENOUS (AV) BRACIOCPHELAIC FISTULA CREATION;  Surgeon: Sheree Penne Bruckner, MD;  Location: [facility] OR;  Service: Vascular;  Laterality: Right;   AV FISTULA PLACEMENT Right 02/19/2022   Procedure: RIGHT ARTERIOVENOUS FISTULA;  Surgeon: Eliza Bruckner RAMAN, MD;  Location: [facility] OR;  Service: Vascular;  Laterality: Right;   BASCILIC VEIN TRANSPOSITION Right 08/07/2022   Procedure: SECOND STAGE RIGHT BASILIC VEIN TRANSPOSITION;  Surgeon: Eliza Bruckner RAMAN, MD;  Location: [facility] OR;  Service: Vascular;  Laterality: Right;   CATARACT EXTRACTION Left 11/2019   CESAREAN SECTION  1983    CHOLECYSTECTOMY N/A 03/05/2020   Procedure: LAPAROSCOPIC CHOLECYSTECTOMY WITH INTRAOPERATIVE CHOLANGIOGRAM;  Surgeon: Belinda Cough, MD;  Location: [facility] ORS;  Service: General;  Laterality: N/A;   ESOPHAGOGASTRODUODENOSCOPY (EGD) WITH PROPOFOL  Left 08/26/2014   Procedure: ESOPHAGOGASTRODUODENOSCOPY (EGD) WITH PROPOFOL ;  Surgeon: Elsie Cree, MD;  Location: [facility] ENDOSCOPY;  Service: Endoscopy;  Laterality: Left;   ESOPHAGOGASTRODUODENOSCOPY (EGD) WITH PROPOFOL  N/A 03/03/2020   Procedure: ESOPHAGOGASTRODUODENOSCOPY (EGD) WITH PROPOFOL ;  Surgeon: Albertus Gordy HERO, MD;  Location: [facility] ENDOSCOPY;  Service: Gastroenterology;  Laterality: N/A;   IR FLUORO GUIDE CV LINE RIGHT  02/15/2022   IR US  GUIDE VASC ACCESS RIGHT  02/15/2022   PERIPHERAL VASCULAR BALLOON ANGIOPLASTY Right 09/25/2022   Procedure: PERIPHERAL VASCULAR BALLOON ANGIOPLASTY;  Surgeon: Serene Gaile ORN, MD;  [facility];  Service: Cardiovascular;  Laterality: Right;  AVF   RIGHT HEART CATH N/A 07/14/2021   Procedure: RIGHT HEART CATH;  Surgeon: Rolan Ezra RAMAN, MD;  Location: [facility] INVASIVE CV LAB;  Service: Cardiovascular;  Laterality: N/A;   RIGHT/LEFT HEART CATH AND CORONARY ANGIOGRAPHY N/A 04/27/2021   Procedure: RIGHT/LEFT HEART CATH AND CORONARY ANGIOGRAPHY;  Surgeon: Court Dorn PARAS, MD;  [facility];  Service: Cardiovascular;  Laterality: N/A;    MEDICATIONS: No current facility-administered medications for this encounter.    acetaminophen  (TYLENOL ) 500 MG tablet   albuterol  (VENTOLIN  HFA) 108 (90 Base) MCG/ACT inhaler   ALPRAZolam  (XANAX ) 0.25 MG tablet   amLODipine  (NORVASC ) 10 MG tablet   aspirin  81 MG EC tablet   atorvastatin  (LIPITOR) 40 MG tablet   ezetimibe  (ZETIA ) 10 MG tablet   isosorbide  mononitrate (IMDUR ) 30 MG 24 hr tablet   labetalol  (NORMODYNE ) 300  MG tablet   levothyroxine  (SYNTHROID ) 25 MCG tablet   omeprazole  (PRILOSEC) 20 MG capsule   ondansetron  (ZOFRAN ) 8 MG tablet   sevelamer  carbonate (RENVELA ) 800 MG tablet   Accu-Chek Softclix Lancets lancets   Blood Glucose Monitoring Suppl (ACCU-CHEK GUIDE) w/Device KIT   Continuous Glucose Receiver (FREESTYLE LIBRE 3 READER) DEVI   Continuous Glucose Sensor (FREESTYLE LIBRE 3 PLUS SENSOR) MISC   glucose blood (ACCU-CHEK GUIDE) test strip   insulin  glargine (LANTUS  SOLOSTAR) 100 UNIT/ML Solostar Pen   Insulin  Pen Needle (PEN NEEDLES) 31G X 8 MM MISC   Multiple Vitamin (MULTI-VITAMIN DAILY) TABS   polyethylene glycol (MIRALAX  / GLYCOLAX ) 17 g packet   Zinc  220 (50 Zn) MG CAPS    Isaiah Ruder, PA-C Surgical Short Stay/Anesthesiology [facility] Phone [phone] [facility] Phone [phone] 12/23/2023 2:33 PM

## 2023-12-23 NOTE — Telephone Encounter (Signed)
 Attempted to call Ashonti to verify insulin dosings and that CGM was available for pick up at [facility].   Will have [name] follow up.   Maralyn Sago, EMT-Paramedic [phone] 12/23/2023

## 2023-12-23 NOTE — Telephone Encounter (Signed)
 Spoke to [facility] and they have to send in a prior authorization for her freestyle to be covered under her medicaid- they will submit same to doctor.   Also her Xanax  was called in for refill and will be ready tomorrow.   Call complete.   Powell Mirza, EMT-Paramedic [phone] 12/23/2023

## 2023-12-23 NOTE — Anesthesia Preprocedure Evaluation (Addendum)
 Anesthesia Evaluation  Patient identified by MRN, date of birth, ID band Patient awake    Reviewed: Allergy & Precautions, NPO status , Patient's Chart, lab work & pertinent test results  History of Anesthesia Complications Negative for: history of anesthetic complications  Airway Mallampati: III  TM Distance: >3 FB Neck ROM: Full   Comment: Previous grade II view with MAC 3 Dental  (+) Dental Advisory Given   Pulmonary shortness of breath (uses 2L O2 with activity) and with exertion, neg sleep apnea, neg recent URI, former smoker   Pulmonary exam normal breath sounds clear to auscultation       Cardiovascular hypertension (amlodipine , ISMN, labetalol ), Pt. on medications and Pt. on home beta blockers pulmonary hypertension(-) angina + CAD, + Peripheral Vascular Disease (s/p bilateral BKA for gangrene 04/04/22), +CHF and + DOE  (-) Past MI, (-) Cardiac Stents and (-) CABG + dysrhythmias (prolonged QT) + Valvular Problems/Murmurs  Rhythm:Regular Rate:Normal  HLD, carotid artery disease  TTE 10/24/2023: IMPRESSIONS    1. Left ventricular ejection fraction, by estimation, is 55 to 60%. The  left ventricle has normal function. The left ventricle has no regional  wall motion abnormalities. Left ventricular diastolic parameters are  indeterminate.   2. Right ventricular systolic function is normal. The right ventricular  size is normal. Tricuspid regurgitation signal is inadequate for assessing  PA pressure.   3. The mitral valve is normal in structure. No evidence of mitral valve  regurgitation. No evidence of mitral stenosis.   4. The aortic valve was not well visualized. Aortic valve regurgitation  is not visualized. No aortic stenosis is present.   5. The inferior vena cava is normal in size with greater than 50%  respiratory variability, suggesting right atrial pressure of 3 mmHg.     Neuro/Psych neg Seizures PSYCHIATRIC  DISORDERS  Depression    TIA Neuromuscular disease (peripheral neuropathy) CVA    GI/Hepatic Neg liver ROS,GERD  Medicated,,Gastroparesis    Endo/Other  diabetes (Hgb A1c 7.9), Poorly Controlled, Type 2, Insulin  DependentHypothyroidism  Secondary hyperparathyroidism  Renal/GU ESRFRenal disease     Musculoskeletal   Abdominal  (+) + obese  Peds  Hematology  (+) Blood dyscrasia, anemia Lab Results      Component                Value               Date                      WBC                      11.4 (H)            07/24/2023                HGB                      11.3 (L)            07/24/2023                HCT                      36.6                07/24/2023                MCV  93.6                07/24/2023                PLT                      223                 07/24/2023              Anesthesia Other Findings   Reproductive/Obstetrics                             Anesthesia Physical Anesthesia Plan  ASA: 4  Anesthesia Plan: General   Post-op Pain Management:    Induction: Intravenous  PONV Risk Score and Plan: 3 and Ondansetron , Dexamethasone  and Treatment may vary due to age or medical condition  Airway Management Planned: LMA  Additional Equipment:   Intra-op Plan:   Post-operative Plan: Extubation in OR  Informed Consent: I have reviewed the patients History and Physical, chart, labs and discussed the procedure including the risks, benefits and alternatives for the proposed anesthesia with the patient or authorized representative who has indicated his/her understanding and acceptance.     Dental advisory given  Plan Discussed with: CRNA and Anesthesiologist  Anesthesia Plan Comments: (PAT note written 12/23/2023 by Allison Zelenak, PA-C.  Discussed potential risks of nerve blocks including, but not limited to, infection, bleeding, nerve damage, seizures, pneumothorax, respiratory depression, and  potential failure of the block. Alternatives to nerve blocks discussed. All questions answered.  Discussed with patient risks of MAC including, but not limited to, minor pain or discomfort, hearing people in the room, and possible need for backup general anesthesia. Risks for general anesthesia also discussed including, but not limited to, sore throat, hoarse voice, chipped/damaged teeth, injury to vocal cords, nausea and vomiting, allergic reactions, lung infection, heart attack, stroke, and death. All questions answered.   )       Anesthesia Quick Evaluation

## 2023-12-23 NOTE — Progress Notes (Signed)
 SDW call  Multiple attempts to contact patient and spouse.  Left detailed message re: arrival time of 0530, NPO, hygiene, medications, Lantus   1 1/2 units the night before and morning of surgery which is 50% of regular dose  Unable to verify allergies, medications, medical history, apnea, thinners.    PCP - Dr. Barnie Louder Cardiologist - Dr. Dorn Lesches HF Cardiologist: Dr. Ezra Shuck Pulmonary:    PPM/ICD -  Device Orders -  Rep Notified -    Chest x-ray - 07/24/2023 EKG -  07/26/2023 Stress Test - ECHO - 10/24/2023 Cardiac Cath - 07/14/2021  ERAS Protcol - NPO  Anesthesia review: Yes. HTN, DM, CAD, CHF, angina, PVD, stroke,   Your procedure is scheduled on Tuesday December 24, 2023  Report to [facility] Main Entrance A at 0530 A.M., then check in with the Admitting office.  Call this number if you have problems the morning of surgery:  [phone]   If you have any questions prior to your surgery date call [phone]: Open Monday-Friday 8am-4pm If you experience any cold or flu symptoms such as cough, fever, chills, shortness of breath, etc. between now and your scheduled surgery, please notify us  at the above number    Remember:  Do not eat or drink after midnight the night before your surgery   Take these medicines the morning of surgery with A SIP OF WATER :  ASA, lipitor, zetia , labetalol , syntroid, omeprazole   As needed: Tylenol , albuterol , zofran   As of today, STOP taking any Aleve , Naproxen , Ibuprofen, Motrin, Advil, Goody's, BC's, all herbal medications, fish oil, and all vitamins.

## 2023-12-24 ENCOUNTER — Encounter (HOSPITAL_COMMUNITY): Payer: Self-pay

## 2023-12-24 ENCOUNTER — Other Ambulatory Visit: Payer: Self-pay

## 2023-12-24 ENCOUNTER — Ambulatory Visit (HOSPITAL_COMMUNITY): Payer: Self-pay | Admitting: Vascular Surgery

## 2023-12-24 ENCOUNTER — Other Ambulatory Visit (HOSPITAL_COMMUNITY): Payer: Self-pay

## 2023-12-24 ENCOUNTER — Ambulatory Visit (HOSPITAL_BASED_OUTPATIENT_CLINIC_OR_DEPARTMENT_OTHER): Payer: Medicare Other | Admitting: Vascular Surgery

## 2023-12-24 ENCOUNTER — Encounter (HOSPITAL_COMMUNITY)
Admission: RE | Disposition: A | Discharge status: Home / Self Care | Payer: Self-pay | Source: Home / Self Care | Attending: Vascular Surgery

## 2023-12-24 ENCOUNTER — Other Ambulatory Visit (HOSPITAL_COMMUNITY): Payer: Self-pay | Admitting: Emergency Medicine

## 2023-12-24 ENCOUNTER — Ambulatory Visit (HOSPITAL_COMMUNITY)
Admission: RE | Admit: 2023-12-24 | Discharge: 2023-12-24 | Disposition: A | Discharge status: Home / Self Care | Payer: Medicare Other | Attending: Vascular Surgery | Admitting: Vascular Surgery

## 2023-12-24 ENCOUNTER — Encounter (HOSPITAL_COMMUNITY): Payer: Self-pay | Admitting: Vascular Surgery

## 2023-12-24 DIAGNOSIS — Z992 Dependence on renal dialysis: Secondary | ICD-10-CM | POA: Insufficient documentation

## 2023-12-24 DIAGNOSIS — E1122 Type 2 diabetes mellitus with diabetic chronic kidney disease: Secondary | ICD-10-CM | POA: Insufficient documentation

## 2023-12-24 DIAGNOSIS — E1142 Type 2 diabetes mellitus with diabetic polyneuropathy: Secondary | ICD-10-CM | POA: Insufficient documentation

## 2023-12-24 DIAGNOSIS — K3184 Gastroparesis: Secondary | ICD-10-CM | POA: Insufficient documentation

## 2023-12-24 DIAGNOSIS — Z8673 Personal history of transient ischemic attack (TIA), and cerebral infarction without residual deficits: Secondary | ICD-10-CM | POA: Insufficient documentation

## 2023-12-24 DIAGNOSIS — E785 Hyperlipidemia, unspecified: Secondary | ICD-10-CM | POA: Insufficient documentation

## 2023-12-24 DIAGNOSIS — Z79899 Other long term (current) drug therapy: Secondary | ICD-10-CM | POA: Insufficient documentation

## 2023-12-24 DIAGNOSIS — Z89512 Acquired absence of left leg below knee: Secondary | ICD-10-CM | POA: Diagnosis not present

## 2023-12-24 DIAGNOSIS — N186 End stage renal disease: Secondary | ICD-10-CM | POA: Diagnosis not present

## 2023-12-24 DIAGNOSIS — Z87891 Personal history of nicotine dependence: Secondary | ICD-10-CM | POA: Insufficient documentation

## 2023-12-24 DIAGNOSIS — K589 Irritable bowel syndrome without diarrhea: Secondary | ICD-10-CM | POA: Diagnosis not present

## 2023-12-24 DIAGNOSIS — F32A Depression, unspecified: Secondary | ICD-10-CM | POA: Insufficient documentation

## 2023-12-24 DIAGNOSIS — I132 Hypertensive heart and chronic kidney disease with heart failure and with stage 5 chronic kidney disease, or end stage renal disease: Secondary | ICD-10-CM

## 2023-12-24 DIAGNOSIS — I272 Pulmonary hypertension, unspecified: Secondary | ICD-10-CM | POA: Insufficient documentation

## 2023-12-24 DIAGNOSIS — Z794 Long term (current) use of insulin: Secondary | ICD-10-CM | POA: Insufficient documentation

## 2023-12-24 DIAGNOSIS — N185 Chronic kidney disease, stage 5: Secondary | ICD-10-CM

## 2023-12-24 DIAGNOSIS — I5032 Chronic diastolic (congestive) heart failure: Secondary | ICD-10-CM | POA: Insufficient documentation

## 2023-12-24 DIAGNOSIS — I2510 Atherosclerotic heart disease of native coronary artery without angina pectoris: Secondary | ICD-10-CM | POA: Diagnosis not present

## 2023-12-24 DIAGNOSIS — E1143 Type 2 diabetes mellitus with diabetic autonomic (poly)neuropathy: Secondary | ICD-10-CM | POA: Diagnosis not present

## 2023-12-24 DIAGNOSIS — Z9049 Acquired absence of other specified parts of digestive tract: Secondary | ICD-10-CM | POA: Insufficient documentation

## 2023-12-24 DIAGNOSIS — Z89511 Acquired absence of right leg below knee: Secondary | ICD-10-CM | POA: Diagnosis not present

## 2023-12-24 HISTORY — PX: AV FISTULA PLACEMENT: SHX1204

## 2023-12-24 LAB — POCT I-STAT, CHEM 8
BUN: 19 mg/dL (ref 8–23)
Calcium, Ion: 0.99 mmol/L — ABNORMAL LOW (ref 1.15–1.40)
Chloride: 95 mmol/L — ABNORMAL LOW (ref 98–111)
Creatinine, Ser: 5.1 mg/dL — ABNORMAL HIGH (ref 0.44–1.00)
Glucose, Bld: 179 mg/dL — ABNORMAL HIGH (ref 70–99)
HCT: 34 % — ABNORMAL LOW (ref 36.0–46.0)
Hemoglobin: 11.6 g/dL — ABNORMAL LOW (ref 12.0–15.0)
Potassium: 4.6 mmol/L (ref 3.5–5.1)
Sodium: 136 mmol/L (ref 135–145)
TCO2: 30 mmol/L (ref 22–32)

## 2023-12-24 LAB — GLUCOSE, CAPILLARY
Glucose-Capillary: 149 mg/dL — ABNORMAL HIGH (ref 70–99)
Glucose-Capillary: 167 mg/dL — ABNORMAL HIGH (ref 70–99)

## 2023-12-24 SURGERY — ARTERIOVENOUS (AV) FISTULA CREATION
Anesthesia: Regional | Site: Arm Upper | Laterality: Left

## 2023-12-24 MED ORDER — TRAMADOL HCL 50 MG PO TABS
50.0000 mg | ORAL_TABLET | Freq: Four times a day (QID) | ORAL | 0 refills | Status: DC | PRN
  Filled 2023-12-24: qty 10, 3d supply, fill #0

## 2023-12-24 MED ORDER — FENTANYL CITRATE (PF) 250 MCG/5ML IJ SOLN
INTRAMUSCULAR | Status: DC | PRN
  Administered 2023-12-24: 100 ug via INTRAVENOUS
  Administered 2023-12-24: 50 ug via INTRAVENOUS

## 2023-12-24 MED ORDER — CHLORHEXIDINE GLUCONATE 4 % EX SOLN: 60.0000 mL | Freq: Once | CUTANEOUS | Status: DC

## 2023-12-24 MED ORDER — SUGAMMADEX SODIUM 200 MG/2ML IV SOLN
INTRAVENOUS | Status: DC | PRN
  Administered 2023-12-24 (×2): 200 mg via INTRAVENOUS

## 2023-12-24 MED ORDER — ONDANSETRON HCL 4 MG/2ML IJ SOLN
INTRAMUSCULAR | Status: AC
  Filled 2023-12-24: qty 2

## 2023-12-24 MED ORDER — FENTANYL CITRATE (PF) 250 MCG/5ML IJ SOLN
INTRAMUSCULAR | Status: AC
  Filled 2023-12-24: qty 5

## 2023-12-24 MED ORDER — HEPARIN 6000 UNIT IRRIGATION SOLUTION
Status: AC
  Filled 2023-12-24: qty 500

## 2023-12-24 MED ORDER — PROPOFOL 10 MG/ML IV BOLUS
INTRAVENOUS | Status: AC
Start: 2023-12-24 — End: ?
  Filled 2023-12-24: qty 20

## 2023-12-24 MED ORDER — ONDANSETRON HCL 4 MG/2ML IJ SOLN
INTRAMUSCULAR | Status: DC | PRN
  Administered 2023-12-24: 4 mg via INTRAVENOUS

## 2023-12-24 MED ORDER — CHLORHEXIDINE GLUCONATE 0.12 % MT SOLN
15.0000 mL | Freq: Once | OROMUCOSAL | Status: AC
Start: 2023-12-24 — End: 2023-12-24
  Administered 2023-12-24: 15 mL via OROMUCOSAL
  Filled 2023-12-24: qty 15

## 2023-12-24 MED ORDER — MIDAZOLAM HCL 2 MG/2ML IJ SOLN
INTRAMUSCULAR | Status: AC
Start: 2023-12-24 — End: ?
  Filled 2023-12-24: qty 2

## 2023-12-24 MED ORDER — CEFAZOLIN SODIUM-DEXTROSE 2-4 GM/100ML-% IV SOLN
2.0000 g | INTRAVENOUS | Status: AC
  Administered 2023-12-24: 2 g via INTRAVENOUS
  Filled 2023-12-24: qty 100

## 2023-12-24 MED ORDER — INSULIN ASPART 100 UNIT/ML IJ SOLN
0.0000 [IU] | INTRAMUSCULAR | Status: DC | PRN
Start: 2023-12-24 — End: 2023-12-24

## 2023-12-24 MED ORDER — ROCURONIUM BROMIDE 10 MG/ML (PF) SYRINGE
PREFILLED_SYRINGE | INTRAVENOUS | Status: AC
  Filled 2023-12-24: qty 10

## 2023-12-24 MED ORDER — STERILE WATER FOR IRRIGATION IR SOLN
Status: DC | PRN
  Administered 2023-12-24: 1000 mL

## 2023-12-24 MED ORDER — PROPOFOL 10 MG/ML IV BOLUS
INTRAVENOUS | Status: DC | PRN
  Administered 2023-12-24: 110 mg via INTRAVENOUS

## 2023-12-24 MED ORDER — LIDOCAINE 2% (20 MG/ML) 5 ML SYRINGE
INTRAMUSCULAR | Status: DC | PRN
  Administered 2023-12-24: 60 mg via INTRAVENOUS

## 2023-12-24 MED ORDER — PHENYLEPHRINE HCL-NACL 20-0.9 MG/250ML-% IV SOLN
INTRAVENOUS | Status: DC | PRN
  Administered 2023-12-24: 30 ug/min via INTRAVENOUS

## 2023-12-24 MED ORDER — ROCURONIUM BROMIDE 10 MG/ML (PF) SYRINGE
PREFILLED_SYRINGE | INTRAVENOUS | Status: DC | PRN
  Administered 2023-12-24 (×2): 20 mg via INTRAVENOUS

## 2023-12-24 MED ORDER — SODIUM CHLORIDE 0.9 % IV SOLN: INTRAVENOUS | Status: DC | PRN

## 2023-12-24 MED ORDER — LIDOCAINE 2% (20 MG/ML) 5 ML SYRINGE
INTRAMUSCULAR | Status: AC
  Filled 2023-12-24: qty 5

## 2023-12-24 MED ORDER — 0.9 % SODIUM CHLORIDE (POUR BTL) OPTIME
TOPICAL | Status: DC | PRN
  Administered 2023-12-24: 1000 mL

## 2023-12-24 MED ORDER — MIDAZOLAM HCL 2 MG/2ML IJ SOLN
INTRAMUSCULAR | Status: DC | PRN
  Administered 2023-12-24: 1 mg via INTRAVENOUS

## 2023-12-24 MED ORDER — LIDOCAINE HCL (PF) 1 % IJ SOLN
INTRAMUSCULAR | Status: DC | PRN
Start: 2023-12-24 — End: 2023-12-24
  Administered 2023-12-24: 8 mL

## 2023-12-24 MED ORDER — AMISULPRIDE (ANTIEMETIC) 5 MG/2ML IV SOLN
10.0000 mg | Freq: Once | INTRAVENOUS | Status: AC | PRN
  Administered 2023-12-24: 10 mg via INTRAVENOUS

## 2023-12-24 MED ORDER — HEPARIN 6000 UNIT IRRIGATION SOLUTION
Status: DC | PRN
  Administered 2023-12-24: 1

## 2023-12-24 MED ORDER — FENTANYL CITRATE (PF) 100 MCG/2ML IJ SOLN: 25.0000 ug | INTRAMUSCULAR | Status: DC | PRN

## 2023-12-24 MED ORDER — LIDOCAINE HCL (PF) 1 % IJ SOLN
INTRAMUSCULAR | Status: AC
  Filled 2023-12-24: qty 30

## 2023-12-24 MED ORDER — LABETALOL HCL 300 MG PO TABS
300.0000 mg | ORAL_TABLET | Freq: Once | ORAL | Status: AC
  Administered 2023-12-24: 300 mg via ORAL
  Filled 2023-12-24: qty 1

## 2023-12-24 MED ORDER — HEPARIN SODIUM (PORCINE) 1000 UNIT/ML IJ SOLN
INTRAMUSCULAR | Status: DC | PRN
  Administered 2023-12-24: 2000 [IU] via INTRAVENOUS

## 2023-12-24 MED ORDER — OXYCODONE HCL 5 MG PO TABS: 5.0000 mg | ORAL_TABLET | Freq: Once | ORAL | Status: DC | PRN

## 2023-12-24 MED ORDER — AMISULPRIDE (ANTIEMETIC) 5 MG/2ML IV SOLN
INTRAVENOUS | Status: AC
  Filled 2023-12-24: qty 4

## 2023-12-24 MED ORDER — OXYCODONE HCL 5 MG/5ML PO SOLN: 5.0000 mg | Freq: Once | ORAL | Status: DC | PRN

## 2023-12-24 MED ORDER — SUCCINYLCHOLINE CHLORIDE 200 MG/10ML IV SOSY
PREFILLED_SYRINGE | INTRAVENOUS | Status: AC
  Filled 2023-12-24: qty 10

## 2023-12-24 MED ORDER — ORAL CARE MOUTH RINSE: 15.0000 mL | Freq: Once | OROMUCOSAL | Status: AC

## 2023-12-24 MED ORDER — SUCCINYLCHOLINE CHLORIDE 200 MG/10ML IV SOSY
PREFILLED_SYRINGE | INTRAVENOUS | Status: DC | PRN
  Administered 2023-12-24: 80 mg via INTRAVENOUS

## 2023-12-24 SURGICAL SUPPLY — 29 items
APPLIER CLIP 9.375 MED OPEN (MISCELLANEOUS) ×1
APPLIER CLIP 9.375 SM OPEN (CLIP) ×2
ARMBAND PINK RESTRICT EXTREMIT (MISCELLANEOUS) ×1 IMPLANT
BAG COUNTER SPONGE SURGICOUNT (BAG) ×1 IMPLANT
BLADE CLIPPER SURG (BLADE) ×1 IMPLANT
BNDG ELASTIC 4X5.8 VLCR STR LF (GAUZE/BANDAGES/DRESSINGS) ×1 IMPLANT
CANISTER SUCT 3000ML PPV (MISCELLANEOUS) ×1 IMPLANT
CLIP APPLIE 9.375 MED OPEN (MISCELLANEOUS) ×1 IMPLANT
CLIP APPLIE 9.375 SM OPEN (CLIP) ×1 IMPLANT
CLIP TI MEDIUM 6 (CLIP) ×2 IMPLANT
CLIP TI WIDE RED SMALL 6 (CLIP) ×1 IMPLANT
COVER PROBE W GEL 5X96 (DRAPES) ×1 IMPLANT
DERMABOND ADVANCED .7 DNX12 (GAUZE/BANDAGES/DRESSINGS) ×1 IMPLANT
ELECT REM PT RETURN 9FT ADLT (ELECTROSURGICAL) ×1
ELECTRODE REM PT RTRN 9FT ADLT (ELECTROSURGICAL) ×1 IMPLANT
GLOVE BIOGEL PI IND STRL 8 (GLOVE) ×1 IMPLANT
GOWN STRL REUS W/ TWL LRG LVL3 (GOWN DISPOSABLE) ×2 IMPLANT
GOWN STRL REUS W/TWL 2XL LVL3 (GOWN DISPOSABLE) ×2 IMPLANT
KIT BASIN OR (CUSTOM PROCEDURE TRAY) ×1 IMPLANT
KIT TURNOVER KIT B (KITS) ×1 IMPLANT
NS IRRIG 1000ML POUR BTL (IV SOLUTION) ×1 IMPLANT
PACK CV ACCESS (CUSTOM PROCEDURE TRAY) ×1 IMPLANT
PAD ARMBOARD 7.5X6 YLW CONV (MISCELLANEOUS) ×2 IMPLANT
SUT MNCRL AB 4-0 PS2 18 (SUTURE) ×1 IMPLANT
SUT PROLENE 6 0 BV (SUTURE) ×1 IMPLANT
SUT VIC AB 3-0 SH 27X BRD (SUTURE) ×1 IMPLANT
TOWEL GREEN STERILE (TOWEL DISPOSABLE) ×1 IMPLANT
UNDERPAD 30X36 HEAVY ABSORB (UNDERPADS AND DIAPERS) ×1 IMPLANT
WATER STERILE IRR 1000ML POUR (IV SOLUTION) ×1 IMPLANT

## 2023-12-24 NOTE — H&P (Signed)
 POST OPERATIVE OFFICE NOTE  Patient seen and examined in preop holding.  No complaints. No changes to medication history or physical exam since last seen in clinic. After discussing the risks and benefits of left arm fistula creation, Nancy [name] elected to proceed.    Nancy FORBES Rim MD   CC:  F/u for surgery  HPI:  Nancy [name] is a 63 y.o. female with ESRD in need for new HD access.   Prior history includes: Right brachiocephalic fistula which failed to mature Right brachiobasilic fistula which was used prior to thrombosis.  At the time of her last visit, she was unsure if she wanted to pursue fistula creation.  On exam today, Nancy [name] was doing well, accompanied by her husband.  She continues to be dialyzed on Monday Wednesday Friday through a left sided tunneled dialysis catheter.  She is interested in possible fistula creation   Allergies  Allergen Reactions   Hydralazine  Other (See Comments)   Hydralazine  Hcl Other (See Comments)    Hair loss   Hydrocodone Itching and Other (See Comments)    Upset stomach   Metformin  And Related Nausea And Vomiting and Other (See Comments)    Stomach pains, also   Other Nausea Only and Other (See Comments)    Lettuce- Does not digest this!!   Plaquenil [Hydroxychloroquine Sulfate] Hives   Shellfish Allergy Nausea And Vomiting   Shellfish-Derived Products Nausea Only and Other (See Comments)    Caused an upset stomach   Shrimp (Diagnostic) Nausea Only and Other (See Comments)    Upset stomach    Sulfa Antibiotics Hives   Sulfur  Hives    Current Facility-Administered Medications  Medication Dose Route Frequency Provider Last Rate Last Admin   ceFAZolin  (ANCEF ) IVPB 2g/100 mL premix  2 g Intravenous 30 min Pre-Op  Dewarren Ledbetter E, MD       chlorhexidine  (HIBICLENS ) 4 % liquid 4 Application  60 mL Topical Once Brentin Shin E, MD       And   chlorhexidine  (HIBICLENS ) 4 % liquid 4 Application  60 mL Topical Once  Murel Shenberger E, MD       insulin  aspart (novoLOG ) injection 0-7 Units  0-7 Units Subcutaneous Q2H PRN Peggye Delon Brunswick, MD       Facility-Administered Medications Ordered in Other Encounters  Medication Dose Route Frequency Provider Last Rate Last Admin   0.9 %  sodium chloride  infusion   Intravenous Continuous PRN Christopher Comings, CRNA   New Bag at 12/24/23 0700     ROS:  See HPI  Physical Exam:  Nonlabored breathing, regular rate. Extremities: Palpable radial arteries bilaterally Bilateral below-knee amputations  Studies:  +-----------------+-------------+----------+--------------+  Right Cephalic   Diameter (cm)Depth (cm)   Findings     +-----------------+-------------+----------+--------------+  Shoulder            0.38        0.60     prior avf     +-----------------+-------------+----------+--------------+  Prox upper arm       0.44        1.21     prior avf     +-----------------+-------------+----------+--------------+  Mid upper arm        0.43        0.75     prior avf     +-----------------+-------------+----------+--------------+  Antecubital fossa                       not visualized  +-----------------+-------------+----------+--------------+  Prox forearm                            not visualized  +-----------------+-------------+----------+--------------+  Mid forearm                             not visualized  +-----------------+-------------+----------+--------------+  Dist forearm                            not visualized  +-----------------+-------------+----------+--------------+   +-----------------+-------------+----------+--------------+  Right Basilic    Diameter (cm)Depth (cm)   Findings     +-----------------+-------------+----------+--------------+  Prox upper arm                            prior avf     +-----------------+-------------+----------+--------------+  Mid upper arm                              prior avf     +-----------------+-------------+----------+--------------+  Dist upper arm                            prior avf     +-----------------+-------------+----------+--------------+  Antecubital fossa                         prior avf     +-----------------+-------------+----------+--------------+  Prox forearm                            not visualized  +-----------------+-------------+----------+--------------+  Mid forearm                             not visualized  +-----------------+-------------+----------+--------------+  Distal forearm                          not visualized  +-----------------+-------------+----------+--------------+  Elbow                                  not visualized  +-----------------+-------------+----------+--------------+   +-----------------+-------------+----------+---------+  Left Cephalic    Diameter (cm)Depth (cm)Findings   +-----------------+-------------+----------+---------+  Shoulder            0.34        1.20              +-----------------+-------------+----------+---------+  Prox upper arm       0.35        1.01              +-----------------+-------------+----------+---------+  Mid upper arm        0.35        0.56              +-----------------+-------------+----------+---------+  Dist upper arm       0.26        0.58   branching  +-----------------+-------------+----------+---------+  Antecubital fossa    0.45        0.42   branching  +-----------------+-------------+----------+---------+  Prox forearm         0.19        0.74              +-----------------+-------------+----------+---------+  Mid forearm          0.13        0.25              +-----------------+-------------+----------+---------+  Dist forearm         0.20        0.21              +-----------------+-------------+----------+---------+    +-----------------+-------------+----------+---------+  Left Basilic     Diameter (cm)Depth (cm)Findings   +-----------------+-------------+----------+---------+  Prox upper arm       0.32        2.73              +-----------------+-------------+----------+---------+  Mid upper arm        0.32        1.88              +-----------------+-------------+----------+---------+  Dist upper arm       0.33        1.40              +-----------------+-------------+----------+---------+  Antecubital fossa    0.23        0.81   branching  +-----------------+-------------+----------+---------+  Prox forearm         0.28        0.20              +-----------------+-------------+----------+---------+  Mid forearm          0.26        0.21              +-----------------+-------------+----------+---------+  Distal forearm       0.23        0.28              +-----------------+-------------+----------+---------+   Assessment/Plan:  This is a 64 y.o. 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   She presents today for new access.  Vein mapping demonstrates adequately sized superficial vessels in the left arm.  Alizae and I discussed left-sided brachiocephalic fistula creation.  We discussed that this may require future superficialization or other interventions. After discussing the risks and benefits of the above, she elected to proceed.   Nancy FORBES Rim MD [facility] [phone] Total time of patient care including pre-visit research, consultation, and documentation greater than 30 minutes

## 2023-12-24 NOTE — Progress Notes (Signed)
 Paramedicine Encounter    Patient ID: [name], female    DOB: Feb 13, 1961, 63 y.o.   MRN: [id number]   Complaints Left arm pain due to Fistula surgery this morning  Assessment A&O x 4, skin W&D w/ good color  Compliance with medsYES  Pill box filled x 1 week  Refills needed NONE  Meds changes since last visit     Social changes NONE   BP (!) 140/78 (BP Location: Right Arm, Patient Position: Sitting, Cuff Size: Normal)   Pulse 78   Resp 16   Wt 245 lb (111.1 kg)   SpO2 94%   BMI 43.40 kg/m  Weight yesterday- not taken Last visit weight-    ATF [name] doing well today.  She had a fistula placed in her left arm earlier today.  She is having some discomfort at the sight but the area does not appear to be red, swollen or hot to the touch.  Discussed w/ pt the that she has now been prescribed a Freestyle Libre 3 and Lantus  Solostar.  At this time she has been instructed not to use the Lantus  until she gets her [facility] and sensors.  She verbalizes that she understands same. Med box reconciled x 1 week.  Next home visit 1/14 @ 1:30  ACTION: Home visit completed  Nancy [name] [phone] 12/25/23  Patient Care Team: [name], MD as PCP - General (Internal Medicine) Court Dorn PARAS, MD as PCP - Cardiology (Cardiology) Rolan Ezra RAMAN, MD as PCP - Advanced Heart Failure (Cardiology) Center, [facility]  Patient Active Problem List   Diagnosis Date Noted   History of CVA (cerebrovascular accident) 03/22/2023   Community acquired pneumonia 03/22/2023   Pain, unspecified 08/15/2022   Complication of vascular dialysis catheter 08/07/2022   Chest pain 05/30/2022   Fluid overload 05/30/2022   Iron  deficiency anemia, unspecified 04/26/2022   Nausea 04/10/2022   Hyponatremia 04/04/2022   Prolonged QT interval 04/04/2022   Anaphylactic shock, unspecified, initial encounter 02/23/2022   Other allergy, initial encounter  02/23/2022   Pressure injury of skin 02/19/2022   Anemia in chronic kidney disease 02/16/2022   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) 02/16/2022   Hyperlipidemia, unspecified 02/16/2022   Hypertensive heart and chronic kidney disease with heart failure and with stage 5 chronic kidney disease, or end stage renal disease (HCC) 02/16/2022   Personal history of nicotine dependence 02/16/2022   Personal history of transient ischemic attack (TIA), and cerebral infarction without residual deficits 02/16/2022   Secondary hyperparathyroidism of renal origin (HCC) 02/16/2022   Renal anasarca 02/14/2022   ESRD on dialysis (HCC) 02/14/2022   Hyperkalemia 02/14/2022   S/P arteriovenous (AV) fistula creation 12/22/2021   Peripheral artery disease (HCC) 10/27/2021   Acute exacerbation of congestive heart failure (HCC) 10/24/2021   Acute cardiogenic pulmonary edema (HCC) 10/06/2021   CAD (coronary artery disease) 10/06/2021   Goals of care, counseling/discussion    Gangrene of left foot (HCC)    Cellulitis of left foot    CHF (congestive heart failure) (HCC) 07/07/2021   Acute postoperative anemia due to expected blood loss superimposed on anemia of chronic renal insufficiency 06/30/2021   CKD (chronic kidney disease), stage IV (HCC) 06/11/2021   Chronic diastolic CHF (congestive heart failure) (HCC) 06/10/2021   Abnormal nuclear stress test    Dyspnea on exertion 03/07/2021   Orthopnea 02/25/2021   History of cerebrovascular accident (CVA) with residual deficit 05/06/2020   AKI (acute kidney injury) (HCC)  04/20/2020   Generalized weakness 04/20/2020   Dehydration 04/20/2020   Generalized abdominal pain    Pancreatitis, acute 02/29/2020   Cerebrovascular accident (CVA) (HCC) 11/08/2019   Stenosis of right carotid artery 11/08/2019   Cortical age-related cataract of both eyes 08/30/2019   Gait abnormality 08/30/2019   Statin declined 08/30/2019   Gastroesophageal reflux disease without esophagitis  04/18/2018   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   Insulin  dependent type 2 diabetes mellitus (HCC) 10/29/2017   History of macular degeneration 10/29/2017   Chronic cervical pain 10/29/2016   Myalgia 09/14/2016   Insomnia 09/14/2016   Tinea pedis of both feet 08/20/2016   Macular degeneration 08/17/2016   Low back pain 09/21/2014   Hypertensive urgency 09/21/2014   HTN (hypertension) 09/08/2014   Thyroid  nodule 08/17/2014   Morbid obesity (HCC) 08/17/2014   Hypokalemia 08/06/2014   Type II diabetes mellitus with neurological manifestations, uncontrolled 08/04/2014    Current Outpatient Medications:    acetaminophen  (TYLENOL ) 500 MG tablet, Take 1,000 mg by mouth every 8 (eight) hours as needed for mild pain (pain score 1-3), moderate pain (pain score 4-6) or headache., Disp: , Rfl:    albuterol  (VENTOLIN  HFA) 108 (90 Base) MCG/ACT inhaler, Inhale 2 puffs into the lungs every 6 (six) hours as needed., Disp: 18 g, Rfl: 3   amLODipine  (NORVASC ) 10 MG tablet, Take 1 tablet (10 mg total) by mouth daily for hypertension, Disp: 90 tablet, Rfl: 1   aspirin  81 MG EC tablet, Take 1 tablet (81 mg total) by mouth daily., Disp: 60 tablet, Rfl: 1   atorvastatin  (LIPITOR) 40 MG tablet, Take 1 tablet (40 mg total) by mouth daily for antilipids, Disp: 90 tablet, Rfl: 1   ezetimibe  (ZETIA ) 10 MG tablet, Take 1 tablet (10 mg total) by mouth daily for antilipids, Disp: 90 tablet, Rfl: 1   isosorbide  mononitrate (IMDUR ) 30 MG 24 hr tablet, Take 2 tablets (60 mg total) by mouth daily for blood pressure, Disp: 180 tablet, Rfl: 1   labetalol  (NORMODYNE ) 300 MG tablet, Take 1 tablet (300 mg total) by mouth 2 (two) times daily for beta blockers, Disp: 180 tablet, Rfl: 1   levothyroxine  (SYNTHROID ) 25 MCG tablet, Take 1 tablet (25 mcg total) by mouth daily for low thyroid  hormone, Disp: 90 tablet, Rfl: 1   omeprazole  (PRILOSEC) 20 MG capsule, Take 2 capsules (40 mg total) by mouth daily for GERD, Disp: 180 capsule, Rfl: 1    ondansetron  (ZOFRAN ) 8 MG tablet, Take 1 tablet (8 mg total) by mouth every 8 (eight) hours as needed for nausea, Disp: 42 tablet, Rfl: 0   sevelamer  carbonate (RENVELA ) 800 MG tablet, Take 3 tablets (2,400 mg total) by mouth 3 (three) times daily with meals and 1 tablet with snacks., Disp: 1080 tablet, Rfl: 3   traMADol  (ULTRAM ) 50 MG tablet, Take 1 tablet (50 mg total) by mouth every 6 (six) hours as needed., Disp: 10 tablet, Rfl: 0   Accu-Chek Softclix Lancets lancets, Use to check blood sugar 3 times daily. (Patient not taking: Reported on 12/03/2023), Disp: 100 each, Rfl: 6   ALPRAZolam  (XANAX ) 0.25 MG tablet, Take 1 tablet (0.25 mg total) by mouth on Mon/Wed and Fri at Hemodialysis. (Patient not taking: Reported on 12/24/2023), Disp: 12 tablet, Rfl: 1   Blood Glucose Monitoring Suppl (ACCU-CHEK GUIDE) w/Device KIT, Use to check blood sugar 3 times daily. (Patient not taking: Reported on 12/03/2023), Disp: 1 kit, Rfl: 0   Continuous Glucose Receiver (FREESTYLE LIBRE 3 READER) DEVI, Use  as directed for continuous glucose monitoring.., Disp: 1 each, Rfl: 0   Continuous Glucose Sensor (FREESTYLE LIBRE 3 PLUS SENSOR) MISC, Change sensor every 15 days. E11.59, Z79.4, Disp: 2 each, Rfl: 11   glucose blood (ACCU-CHEK GUIDE) test strip, Use to check blood sugar 3 times daily. (Patient not taking: Reported on 12/03/2023), Disp: 100 each, Rfl: 6   insulin  glargine (LANTUS  SOLOSTAR) 100 UNIT/ML Solostar Pen, Inject 3 Units into the skin 2 (two) times daily., Disp: 15 mL, Rfl: PRN   Insulin  Pen Needle (PEN NEEDLES) 31G X 8 MM MISC, Use for injection with insulin  pen daily., Disp: 100 each, Rfl: 6   Multiple Vitamin (MULTI-VITAMIN DAILY) TABS, Take 1 tablet by mouth at bedtime for supplementation (Patient not taking: Reported on 12/03/2023), Disp: 14 tablet, Rfl: 0   polyethylene glycol (MIRALAX  / GLYCOLAX ) 17 g packet, Give 17 gram by mouth one time a day for constipation (Patient not taking: Reported on  12/03/2023), Disp: 14 packet, Rfl: 0   Zinc  220 (50 Zn) MG CAPS, Take 1 capsule by mouth daily for minerals, electrolytes (Patient not taking: Reported on 12/03/2023), Disp: 14 capsule, Rfl: 0 Allergies  Allergen Reactions   Hydralazine  Other (See Comments)   Hydralazine  Hcl Other (See Comments)    Hair loss   Hydrocodone Itching and Other (See Comments)    Upset stomach   Metformin  And Related Nausea And Vomiting and Other (See Comments)    Stomach pains, also   Other Nausea Only and Other (See Comments)    Lettuce- Does not digest this!!   Plaquenil [Hydroxychloroquine Sulfate] Hives   Shellfish Allergy Nausea And Vomiting   Shellfish-Derived Products Nausea Only and Other (See Comments)    Caused an upset stomach   Shrimp (Diagnostic) Nausea Only and Other (See Comments)    Upset stomach    Sulfa Antibiotics Hives   Sulfur  Hives     Social History   Socioeconomic History   Marital status: Legally Separated    Spouse name: Lamar   Number of children: 1   Years of education: some colle   Highest education level: Not on file  Occupational History   Occupation: Employed FT as warden/ranger    Comment: [employer] , NA  Tobacco Use   Smoking status: Former    Current packs/day: 0.25    Average packs/day: 0.3 packs/day for 0.5 years (0.1 ttl pk-yrs)    Types: Cigarettes    Passive exposure: Never   Smokeless tobacco: Never   Tobacco comments:    Former smoker 11/03/21  Vaping Use   Vaping status: Never Used  Substance and Sexual Activity   Alcohol use: No   Drug use: No   Sexual activity: Not Currently    Birth control/protection: None  Other Topics Concern   Not on file  Social History Narrative   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    Social Drivers of Corporate Investment Banker Strain: Low Risk  (10/22/2023)   Overall Financial Resource Strain (CARDIA)    Difficulty of Paying Living Expenses: Not very hard  Food  Insecurity: No Food Insecurity (10/22/2023)   Hunger Vital Sign    Worried About Running Out of Food in the Last Year: Never true    Ran Out of Food in the Last Year: Never true  Transportation Needs: No Transportation Needs (10/22/2023)   PRAPARE - Administrator, Civil Service (Medical): No    Lack of Transportation (  Non-Medical): No  Physical Activity: Inactive (10/22/2023)   Exercise Vital Sign    Days of Exercise per Week: 0 days    Minutes of Exercise per Session: 0 min  Stress: No Stress Concern Present (10/22/2023)   [employer] of Occupational Health - Occupational Stress Questionnaire    Feeling of Stress : Not at all  Social Connections: Unknown (10/22/2023)   Social Connection and Isolation Panel [NHANES]    Frequency of Communication with Friends and Family: More than three times a week    Frequency of Social Gatherings with Friends and Family: More than three times a week    Attends Religious Services: Not on file    Active Member of Clubs or Organizations: Yes    Attends Banker Meetings: Not on file    Marital Status: Married  Intimate Partner Violence: Not At Risk (10/22/2023)   Humiliation, Afraid, Rape, and Kick questionnaire    Fear of Current or Ex-Partner: No    Emotionally Abused: No    Physically Abused: No    Sexually Abused: No    Physical Exam      Future Appointments  Date Time Provider Department Center  02/04/2024 10:10 AM [name], MD [facility] None  02/06/2024  9:00 AM [facility]  02/06/2024 10:00 AM [facility]

## 2023-12-24 NOTE — Discharge Instructions (Signed)

## 2023-12-24 NOTE — Op Note (Signed)
    NAME: Nancy [name]    MRN: [id number] DOB: 1961/04/23    DATE OF OPERATION: 12/24/2023  PREOP DIAGNOSIS:    End stage renal disease  POSTOP DIAGNOSIS:    Same  PROCEDURE:    Left arm radiocephalic fistula creation  SURGEON: Fonda FORBES Rim  ASSIST: Donnice Sender PA  ANESTHESIA: General   EBL: 10ml  INDICATIONS:    [name] FRAME is a 63 y.o. female with end stage renal disease in need for long term HD access.  She has had multiple failed HD access attempts in the right upper extremity.  After discussing risks and benefits of left upper extremity fistula creation, [name] elected to proceed.  FINDINGS:   High brachial bifurcation with 2.5 mm radial artery, 2.5 mm ulnar artery 4 mm cephalic vein  TECHNIQUE:   The patient was brought to the operating room and placed in supine position. The left arm was prepped and draped in standard fashion. IV antibiotics were administered. A timeout was performed.   The case began with ultrasound insonation of the brachial artery and cephalic vein.the brachial artery demonstrated a high bifurcation.  In the antecubital fossa the radial artery and ulnar arteries were uncinated.  The vein was of sufficient size, the artery was smaller at 2.5 mm.  A transverse incision was made below the elbow creese in the antecubital fossa. The  cephalic vein was isolated for 4 cm in length, including the median antecubital vein. Next the aponeurosis was partially released and the radial artery secured with a vessel loop. The patient was heparinized. The median antecubital vein had a better size match, therefore this was used. It was transected and ligated distally with a 2-0 silk stick-tie. The vein was dilated with coronary dilators and flushed with heparin  saline. Vascular clamps were placed proximally and distally on the brachial artery and a 5 mm arteriotomy was created on the brachial artery. This was flushed with heparin  saline.  An  anastomosis was created in end to side fashion on the radial artery using running 6-0 Prolene suture.  Prior to completing the anastomosis, the vessels were flushed and the suture line was tied down. There was an excellent thrill in the cephalic vein from the anastomosis to the mid upper bicipital region. The patient had a monophasic radial and multiphasic ulnar signal. He had an excellent doppler signal in the fistula. The incision was irrigated and hemostasis achieved with cautery and suture. The deeper tissue was closed with 3-0 Vicryl and the skin closed with 4-0 Monocryl.  Dermabond was applied to the incisions. The patient was transferred to PACU in stable condition.  Given the complexity of the case,  the assistant was necessary in order to expedient the procedure and safely perform the technical aspects of the operation.  The assistant provided traction and countertraction to assist with exposure of the artery and vein.  They also assisted with suture ligation of multiple venous branches.  They played a critical role in the anastomosis. These skills, especially following the Prolene suture for the anastomosis, could not have been adequately performed by a scrub tech assistant.    Fonda FORBES Rim, MD [facility] DATE OF DICTATION:   12/24/2023

## 2023-12-24 NOTE — Anesthesia Procedure Notes (Signed)
 Procedure Name: Intubation Date/Time: 12/24/2023 8:01 AM  Performed by: Christopher Comings, CRNAPre-anesthesia Checklist: Patient identified, Emergency Drugs available, Suction available and Patient being monitored Patient Re-evaluated:Patient Re-evaluated prior to induction Oxygen Delivery Method: Circle system utilized Preoxygenation: Pre-oxygenation with 100% oxygen Induction Type: IV induction Ventilation: Mask ventilation without difficulty Laryngoscope Size: Mac and 4 Grade View: Grade II Tube type: Oral Tube size: 7.0 mm Number of attempts: 1 Airway Equipment and Method: Stylet and Oral airway Placement Confirmation: ETT inserted through vocal cords under direct vision, positive ETCO2 and breath sounds checked- equal and bilateral Secured at: 21 cm Tube secured with: Tape Dental Injury: Teeth and Oropharynx as per pre-operative assessment

## 2023-12-24 NOTE — Anesthesia Postprocedure Evaluation (Signed)
 Anesthesia Post Note  Patient: Nancy [name]  Procedure(s) Performed: LEFT BRACHIOCEPHALIC ARTERIOVENOUS (AV) FISTULA CREATION (Left: Arm Upper)     Patient location during evaluation: PACU Anesthesia Type: General Level of consciousness: awake Pain management: pain level controlled Vital Signs Assessment: post-procedure vital signs reviewed and stable Respiratory status: spontaneous breathing, nonlabored ventilation and respiratory function stable Cardiovascular status: blood pressure returned to baseline and stable Postop Assessment: no apparent nausea or vomiting Anesthetic complications: no   There were no known notable events for this encounter.  Last Vitals:  Vitals:   12/24/23 0945 12/24/23 1000  BP: (!) 156/74 (!) 150/71  Pulse: 83 83  Resp: 17 15  Temp:  36.6 C  SpO2: 96% 97%    Last Pain:  Vitals:   12/24/23 1000  TempSrc:   PainSc: 0-No pain                 Delon Aisha Arch

## 2023-12-24 NOTE — Transfer of Care (Signed)
 Immediate Anesthesia Transfer of Care Note  Patient: Nancy [name]  Procedure(s) Performed: LEFT BRACHIOCEPHALIC ARTERIOVENOUS (AV) FISTULA CREATION (Left: Arm Upper)  Patient Location: PACU  Anesthesia Type:General  Level of Consciousness: awake, alert , and oriented  Airway & Oxygen Therapy: Patient connected to face mask oxygen  Post-op Assessment: Report given to RN and Post -op Vital signs reviewed and stable  Post vital signs: Reviewed and stable  Last Vitals:  Vitals Value Taken Time  BP 160/89 12/24/23 0932  Temp    Pulse 80 12/24/23 0933  Resp 19 12/24/23 0933  SpO2 96 % 12/24/23 0933  Vitals shown include unfiled device data.  Last Pain:  Vitals:   12/24/23 0629  TempSrc:   PainSc: 0-No pain      Patients Stated Pain Goal: 0 (12/24/23 9370)  Complications: There were no known notable events for this encounter.

## 2023-12-25 ENCOUNTER — Encounter (HOSPITAL_COMMUNITY): Payer: Self-pay | Admitting: Vascular Surgery

## 2023-12-25 NOTE — Telephone Encounter (Signed)
 Request for medical records or signed for? Signed form will be in your forms folder. Did not receive request for medical records.

## 2023-12-26 NOTE — Telephone Encounter (Signed)
 Form successfully faxed back to [facility] on 12/26/2023.

## 2023-12-27 ENCOUNTER — Other Ambulatory Visit: Payer: Self-pay

## 2023-12-31 ENCOUNTER — Other Ambulatory Visit (HOSPITAL_COMMUNITY): Payer: Self-pay

## 2023-12-31 ENCOUNTER — Other Ambulatory Visit: Payer: Self-pay | Admitting: Physician Assistant

## 2023-12-31 ENCOUNTER — Other Ambulatory Visit (HOSPITAL_COMMUNITY): Payer: Self-pay | Admitting: Emergency Medicine

## 2023-12-31 ENCOUNTER — Telehealth: Payer: Self-pay | Admitting: Pharmacist

## 2023-12-31 ENCOUNTER — Telehealth (HOSPITAL_COMMUNITY): Payer: Self-pay | Admitting: Emergency Medicine

## 2023-12-31 MED ORDER — TRAMADOL HCL 50 MG PO TABS
50.0000 mg | ORAL_TABLET | Freq: Four times a day (QID) | ORAL | 0 refills | Status: DC | PRN
  Filled 2023-12-31: qty 6, 2d supply, fill #0

## 2023-12-31 NOTE — Progress Notes (Signed)
 Paramedicine Encounter    Patient ID: Nancy [name], female    DOB: 1961-09-04, 63 y.o.   MRN: [id number]   Complaints NONE  Assessment A&O x 4, skin W&D w/ good color.  Denies chest pain or SOB. Lung sounds clear and equal bilat.  No edema noted.  BP elevated and not symptomatic of same.  Ate a can of Spaghetti & Meatballs for breakfast.  Discussed reading nutrition labels and making good food choices regarding sodium content.  Compliance with meds YES  Pill box filled x 1 week  Refills needed Waiting on prior authorization for Freestyle Libre sensors  Meds changes since last visit Adding Lantus  once [employer] sensors are approved and arrived.    Social changes NONE   BP (!) 160/90 (BP Location: Right Arm, Patient Position: Sitting, Cuff Size: Normal)   Pulse 80   Resp 16   Wt 237 lb (107.5 kg)   SpO2 98%   BMI 41.98 kg/m  Weight yesterday- Not taken Last visit weight-240lb  ACTION: Home visit completed  [name] [phone] 12/31/23  Patient Care Team: [name], MD as PCP - General (Internal Medicine) Court Dorn PARAS, MD as PCP - Cardiology (Cardiology) Rolan Ezra RAMAN, MD as PCP - Advanced Heart Failure (Cardiology) Center, [facility]  Patient Active Problem List   Diagnosis Date Noted   History of CVA (cerebrovascular accident) 03/22/2023   Community acquired pneumonia 03/22/2023   Pain, unspecified 08/15/2022   Complication of vascular dialysis catheter 08/07/2022   Chest pain 05/30/2022   Fluid overload 05/30/2022   Iron  deficiency anemia, unspecified 04/26/2022   Nausea 04/10/2022   Hyponatremia 04/04/2022   Prolonged QT interval 04/04/2022   Anaphylactic shock, unspecified, initial encounter 02/23/2022   Other allergy, initial encounter 02/23/2022   Pressure injury of skin 02/19/2022   Anemia in chronic kidney disease 02/16/2022   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) 02/16/2022   Hyperlipidemia,  unspecified 02/16/2022   Hypertensive heart and chronic kidney disease with heart failure and with stage 5 chronic kidney disease, or end stage renal disease (HCC) 02/16/2022   Personal history of nicotine dependence 02/16/2022   Personal history of transient ischemic attack (TIA), and cerebral infarction without residual deficits 02/16/2022   Secondary hyperparathyroidism of renal origin (HCC) 02/16/2022   Renal anasarca 02/14/2022   ESRD on dialysis (HCC) 02/14/2022   Hyperkalemia 02/14/2022   S/P arteriovenous (AV) fistula creation 12/22/2021   Peripheral artery disease (HCC) 10/27/2021   Acute exacerbation of congestive heart failure (HCC) 10/24/2021   Acute cardiogenic pulmonary edema (HCC) 10/06/2021   CAD (coronary artery disease) 10/06/2021   Goals of care, counseling/discussion    Gangrene of left foot (HCC)    Cellulitis of left foot    CHF (congestive heart failure) (HCC) 07/07/2021   Acute postoperative anemia due to expected blood loss superimposed on anemia of chronic renal insufficiency 06/30/2021   CKD (chronic kidney disease), stage IV (HCC) 06/11/2021   Chronic diastolic CHF (congestive heart failure) (HCC) 06/10/2021   Abnormal nuclear stress test    Dyspnea on exertion 03/07/2021   Orthopnea 02/25/2021   History of cerebrovascular accident (CVA) with residual deficit 05/06/2020   AKI (acute kidney injury) (HCC) 04/20/2020   Generalized weakness 04/20/2020   Dehydration 04/20/2020   Generalized abdominal pain    Pancreatitis, acute 02/29/2020   Cerebrovascular accident (CVA) (HCC) 11/08/2019   Stenosis of right carotid artery 11/08/2019   Cortical age-related cataract of both eyes 08/30/2019   Gait  abnormality 08/30/2019   Statin declined 08/30/2019   Gastroesophageal reflux disease without esophagitis 04/18/2018   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   Insulin  dependent type 2 diabetes mellitus (HCC) 10/29/2017   History of macular degeneration 10/29/2017   Chronic  cervical pain 10/29/2016   Myalgia 09/14/2016   Insomnia 09/14/2016   Tinea pedis of both feet 08/20/2016   Macular degeneration 08/17/2016   Low back pain 09/21/2014   Hypertensive urgency 09/21/2014   HTN (hypertension) 09/08/2014   Thyroid  nodule 08/17/2014   Morbid obesity (HCC) 08/17/2014   Hypokalemia 08/06/2014   Type II diabetes mellitus with neurological manifestations, uncontrolled 08/04/2014    Current Outpatient Medications:    Accu-Chek Softclix Lancets lancets, Use to check blood sugar 3 times daily. (Patient not taking: Reported on 12/03/2023), Disp: 100 each, Rfl: 6   acetaminophen  (TYLENOL ) 500 MG tablet, Take 1,000 mg by mouth every 8 (eight) hours as needed for mild pain (pain score 1-3), moderate pain (pain score 4-6) or headache., Disp: , Rfl:    albuterol  (VENTOLIN  HFA) 108 (90 Base) MCG/ACT inhaler, Inhale 2 puffs into the lungs every 6 (six) hours as needed., Disp: 18 g, Rfl: 3   ALPRAZolam  (XANAX ) 0.25 MG tablet, Take 1 tablet (0.25 mg total) by mouth on Mon/Wed and Fri at Hemodialysis. (Patient not taking: Reported on 12/24/2023), Disp: 12 tablet, Rfl: 1   amLODipine  (NORVASC ) 10 MG tablet, Take 1 tablet (10 mg total) by mouth daily for hypertension, Disp: 90 tablet, Rfl: 1   aspirin  81 MG EC tablet, Take 1 tablet (81 mg total) by mouth daily., Disp: 60 tablet, Rfl: 1   atorvastatin  (LIPITOR) 40 MG tablet, Take 1 tablet (40 mg total) by mouth daily for antilipids, Disp: 90 tablet, Rfl: 1   Blood Glucose Monitoring Suppl (ACCU-CHEK GUIDE) w/Device KIT, Use to check blood sugar 3 times daily. (Patient not taking: Reported on 12/03/2023), Disp: 1 kit, Rfl: 0   Continuous Glucose Receiver (FREESTYLE LIBRE 3 READER) DEVI, Use as directed for continuous glucose monitoring.. (Patient not taking: Reported on 12/25/2023), Disp: 1 each, Rfl: 0   Continuous Glucose Sensor (FREESTYLE LIBRE 3 PLUS SENSOR) MISC, Change sensor every 15 days. E11.59, Z79.4 (Patient not taking: Reported  on 12/25/2023), Disp: 2 each, Rfl: 11   ezetimibe  (ZETIA ) 10 MG tablet, Take 1 tablet (10 mg total) by mouth daily for antilipids, Disp: 90 tablet, Rfl: 1   glucose blood (ACCU-CHEK GUIDE) test strip, Use to check blood sugar 3 times daily. (Patient not taking: Reported on 12/03/2023), Disp: 100 each, Rfl: 6   insulin  glargine (LANTUS  SOLOSTAR) 100 UNIT/ML Solostar Pen, Inject 3 Units into the skin 2 (two) times daily. (Patient not taking: Reported on 12/25/2023), Disp: 15 mL, Rfl: PRN   Insulin  Pen Needle (PEN NEEDLES) 31G X 8 MM MISC, Use for injection with insulin  pen daily. (Patient not taking: Reported on 12/25/2023), Disp: 100 each, Rfl: 6   isosorbide  mononitrate (IMDUR ) 30 MG 24 hr tablet, Take 2 tablets (60 mg total) by mouth daily for blood pressure, Disp: 180 tablet, Rfl: 1   labetalol  (NORMODYNE ) 300 MG tablet, Take 1 tablet (300 mg total) by mouth 2 (two) times daily for beta blockers, Disp: 180 tablet, Rfl: 1   levothyroxine  (SYNTHROID ) 25 MCG tablet, Take 1 tablet (25 mcg total) by mouth daily for low thyroid  hormone, Disp: 90 tablet, Rfl: 1   Multiple Vitamin (MULTI-VITAMIN DAILY) TABS, Take 1 tablet by mouth at bedtime for supplementation (Patient not taking: Reported on 12/03/2023), Disp:  14 tablet, Rfl: 0   omeprazole  (PRILOSEC) 20 MG capsule, Take 2 capsules (40 mg total) by mouth daily for GERD, Disp: 180 capsule, Rfl: 1   ondansetron  (ZOFRAN ) 8 MG tablet, Take 1 tablet (8 mg total) by mouth every 8 (eight) hours as needed for nausea, Disp: 42 tablet, Rfl: 0   polyethylene glycol (MIRALAX  / GLYCOLAX ) 17 g packet, Give 17 gram by mouth one time a day for constipation (Patient not taking: Reported on 12/03/2023), Disp: 14 packet, Rfl: 0   sevelamer  carbonate (RENVELA ) 800 MG tablet, Take 3 tablets (2,400 mg total) by mouth 3 (three) times daily with meals and 1 tablet with snacks., Disp: 1080 tablet, Rfl: 3   traMADol  (ULTRAM ) 50 MG tablet, Take 1 tablet (50 mg total) by mouth every 6 (six)  hours as needed., Disp: 10 tablet, Rfl: 0   Zinc  220 (50 Zn) MG CAPS, Take 1 capsule by mouth daily for minerals, electrolytes (Patient not taking: Reported on 12/03/2023), Disp: 14 capsule, Rfl: 0 Allergies  Allergen Reactions   Hydralazine  Other (See Comments)   Hydralazine  Hcl Other (See Comments)    Hair loss   Hydrocodone Itching and Other (See Comments)    Upset stomach   Metformin  And Related Nausea And Vomiting and Other (See Comments)    Stomach pains, also   Other Nausea Only and Other (See Comments)    Lettuce- Does not digest this!!   Plaquenil [Hydroxychloroquine Sulfate] Hives   Shellfish Allergy Nausea And Vomiting   Shellfish-Derived Products Nausea Only and Other (See Comments)    Caused an upset stomach   Shrimp (Diagnostic) Nausea Only and Other (See Comments)    Upset stomach    Sulfa Antibiotics Hives   Sulfur  Hives     Social History   Socioeconomic History   Marital status: Legally Separated    Spouse name: Lamar   Number of children: 1   Years of education: some colle   Highest education level: Not on file  Occupational History   Occupation: Employed FT as warden/ranger    Comment: [employer] , NA  Tobacco Use   Smoking status: Former    Current packs/day: 0.25    Average packs/day: 0.3 packs/day for 0.5 years (0.1 ttl pk-yrs)    Types: Cigarettes    Passive exposure: Never   Smokeless tobacco: Never   Tobacco comments:    Former smoker 11/03/21  Vaping Use   Vaping status: Never Used  Substance and Sexual Activity   Alcohol use: No   Drug use: No   Sexual activity: Not Currently    Birth control/protection: None  Other Topics Concern   Not on file  Social History Narrative   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    Social Drivers of Corporate Investment Banker Strain: Low Risk  (10/22/2023)   Overall Financial Resource Strain (CARDIA)    Difficulty of Paying Living Expenses: Not very hard  Food  Insecurity: No Food Insecurity (10/22/2023)   Hunger Vital Sign    Worried About Running Out of Food in the Last Year: Never true    Ran Out of Food in the Last Year: Never true  Transportation Needs: No Transportation Needs (10/22/2023)   PRAPARE - Administrator, Civil Service (Medical): No    Lack of Transportation (Non-Medical): No  Physical Activity: Inactive (10/22/2023)   Exercise Vital Sign    Days of Exercise per Week: 0 days  Minutes of Exercise per Session: 0 min  Stress: No Stress Concern Present (10/22/2023)   [employer] of Occupational Health - Occupational Stress Questionnaire    Feeling of Stress : Not at all  Social Connections: Unknown (10/22/2023)   Social Connection and Isolation Panel [NHANES]    Frequency of Communication with Friends and Family: More than three times a week    Frequency of Social Gatherings with Friends and Family: More than three times a week    Attends Religious Services: Not on file    Active Member of Clubs or Organizations: Yes    Attends Banker Meetings: Not on file    Marital Status: Married  Intimate Partner Violence: Not At Risk (10/22/2023)   Humiliation, Afraid, Rape, and Kick questionnaire    Fear of Current or Ex-Partner: No    Emotionally Abused: No    Physically Abused: No    Sexually Abused: No    Physical Exam      Future Appointments  Date Time Provider Department Center  02/04/2024 10:10 AM [name], MD [facility] None  02/06/2024  9:00 AM [facility]  02/06/2024 10:00 AM [facility]

## 2023-12-31 NOTE — Telephone Encounter (Signed)
 Hey friend,   Are you able to help me with this? It looks like she has Medicare and Medicaid. I'm assuming we'll need to bill Medicare first. Would a PA help or is this an issue that would require Medicare B billing?

## 2023-12-31 NOTE — Telephone Encounter (Signed)
-----   Message from [facility] [name] S sent at 12/31/2023  6:01 PM EST ----- Regarding: Freestyle Libre 3 Sensors Hi!  Hope you're doing well.   I was wondering if you could help me/ advise me regarding Mrs. Arpino's prior authorization for her [employer]?  How long does this generally take?  Is there any chance the sensors won't be covered? I saw her today and got the [place] app set up on her phone.  She has not started her Lantus  yet because she doesn't have a glucometer and of course she won't need it if we can get the sensors approved.  Thanks in advance for your help/guidance,    Mary Sharps, EMT-Paramedic [phone] 12/31/2023

## 2023-12-31 NOTE — Telephone Encounter (Signed)
 Called and LVM to inform Mrs. Melhorn that the PA did approve a few more Tramadol for pain control following her Fistula graft from last week.    Beatrix Shipper, EMT-Paramedic [phone] 12/31/2023

## 2024-01-02 ENCOUNTER — Other Ambulatory Visit: Payer: Self-pay

## 2024-01-02 ENCOUNTER — Other Ambulatory Visit: Payer: Self-pay | Admitting: Pharmacist

## 2024-01-02 MED ORDER — FREESTYLE LIBRE 3 READER DEVI: 0 refills | Status: DC

## 2024-01-02 MED ORDER — FREESTYLE LIBRE 3 PLUS SENSOR MISC
11 refills | Status: DC
  Filled 2024-06-04: qty 2, 30d supply, fill #0

## 2024-01-07 ENCOUNTER — Other Ambulatory Visit (HOSPITAL_COMMUNITY): Payer: Self-pay | Admitting: Emergency Medicine

## 2024-01-07 NOTE — Progress Notes (Signed)
Paramedicine Encounter    Patient ID: Nancy [name], female    DOB: 1961/11/13, 63 y.o.   MRN: [id number]   Complaints NONE  Assessment A&O x 4, skin W&D w/ good color.  Denies chest pain or SOB.  Lung sounds clear and equal throughout and no peripheral edema noted.  Compliance with meds YES/ had not yet taken this morning's meds at time of visit, but did so during home visit.  Pill box filled x 1 week  Refills needed NONE  Meds changes since last visit NONE    Social changes NONE   BP (!) 180/88 (BP Location: Right Arm, Patient Position: Sitting, Cuff Size: Large)   Pulse 72   Resp 16   Wt 229 lb 3.2 oz (104 kg)   SpO2 100%   BMI 40.60 kg/m  CBG 216 Weight yesterday- not taken Last visit weight-237lb  Today's visit finds Nancy [name] saying, "I feel really good today."  She has been compliant with her meds although she had not yet taken her morning meds.  She stated that she forgot and took them in my presence. Blood pressure is elevated today.  She states she ate a [employer], egg & cheese croissant for breakfast.  I immediately googled the nutrition content and shared with her the 610mg  of Sodium.  Poor food choices have been a trend for her and we discussed the importance of reading labels and trying to make better choices.  She states she will try. Med box reconciled x 1 week.  Reached out to [facility] to follow up on status of her Freestyle Libre sensors prescription and it is not yet ready.  She is currently still holding her Lantus pending getting her sensors.  I have already downloaded the app on her phone to pair w/ sensors so that she can keep track of her blood sugar readings. Next home visit 01/14/24 @ 12:30.   ACTION: Home visit completed  Bethanie Dicker [phone] 01/07/24  Patient Care Team: Marcine Matar, MD as PCP - General (Internal Medicine) Runell Gess, MD as PCP - Cardiology (Cardiology) Laurey Morale, MD as PCP -  Advanced [facility] (Cardiology) [facility], [facility]  Patient Active Problem List   Diagnosis Date Noted   History of CVA (cerebrovascular accident) 03/22/2023   Community acquired pneumonia 03/22/2023   Pain, unspecified 08/15/2022   Complication of vascular dialysis catheter 08/07/2022   Chest pain 05/30/2022   Fluid overload 05/30/2022   Iron deficiency anemia, unspecified 04/26/2022   Nausea 04/10/2022   Hyponatremia 04/04/2022   Prolonged QT interval 04/04/2022   Anaphylactic shock, unspecified, initial encounter 02/23/2022   Other allergy, initial encounter 02/23/2022   Pressure injury of skin 02/19/2022   Anemia in chronic kidney disease 02/16/2022   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) 02/16/2022   Hyperlipidemia, unspecified 02/16/2022   Hypertensive heart and chronic kidney disease with heart failure and with stage 5 chronic kidney disease, or end stage renal disease (HCC) 02/16/2022   Personal history of nicotine dependence 02/16/2022   Personal history of transient ischemic attack (TIA), and cerebral infarction without residual deficits 02/16/2022   Secondary hyperparathyroidism of renal origin (HCC) 02/16/2022   Renal anasarca 02/14/2022   ESRD on dialysis (HCC) 02/14/2022   Hyperkalemia 02/14/2022   S/P arteriovenous (AV) fistula creation 12/22/2021   Peripheral artery disease (HCC) 10/27/2021   Acute exacerbation of congestive heart failure (HCC) 10/24/2021   Acute cardiogenic pulmonary edema (HCC) 10/06/2021   CAD (coronary  artery disease) 10/06/2021   Goals of care, counseling/discussion    Gangrene of left foot (HCC)    Cellulitis of left foot    CHF (congestive heart failure) (HCC) 07/07/2021   Acute postoperative anemia due to expected blood loss superimposed on anemia of chronic renal insufficiency 06/30/2021   CKD (chronic kidney disease), stage IV (HCC) 06/11/2021   Chronic diastolic CHF (congestive heart failure) (HCC) 06/10/2021   Abnormal  nuclear stress test    Dyspnea on exertion 03/07/2021   Orthopnea 02/25/2021   History of cerebrovascular accident (CVA) with residual deficit 05/06/2020   AKI (acute kidney injury) (HCC) 04/20/2020   Generalized weakness 04/20/2020   Dehydration 04/20/2020   Generalized abdominal pain    Pancreatitis, acute 02/29/2020   Cerebrovascular accident (CVA) (HCC) 11/08/2019   Stenosis of right carotid artery 11/08/2019   Cortical age-related cataract of both eyes 08/30/2019   Gait abnormality 08/30/2019   Statin declined 08/30/2019   Gastroesophageal reflux disease without esophagitis 04/18/2018   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   Insulin dependent type 2 diabetes mellitus (HCC) 10/29/2017   History of macular degeneration 10/29/2017   Chronic cervical pain 10/29/2016   Myalgia 09/14/2016   Insomnia 09/14/2016   Tinea pedis of both feet 08/20/2016   Macular degeneration 08/17/2016   Low back pain 09/21/2014   Hypertensive urgency 09/21/2014   HTN (hypertension) 09/08/2014   Thyroid nodule 08/17/2014   Morbid obesity (HCC) 08/17/2014   Hypokalemia 08/06/2014   Type II diabetes mellitus with neurological manifestations, uncontrolled 08/04/2014    Current Outpatient Medications:    acetaminophen (TYLENOL) 500 MG tablet, Take 1,000 mg by mouth every 8 (eight) hours as needed for mild pain (pain score 1-3), moderate pain (pain score 4-6) or headache., Disp: , Rfl:    albuterol (VENTOLIN HFA) 108 (90 Base) MCG/ACT inhaler, Inhale 2 puffs into the lungs every 6 (six) hours as needed., Disp: 18 g, Rfl: 3   ALPRAZolam (XANAX) 0.25 MG tablet, Take 1 tablet (0.25 mg total) by mouth on Mon/Wed and Fri at Hemodialysis., Disp: 12 tablet, Rfl: 1   amLODipine (NORVASC) 10 MG tablet, Take 1 tablet (10 mg total) by mouth daily for hypertension, Disp: 90 tablet, Rfl: 1   aspirin 81 MG EC tablet, Take 1 tablet (81 mg total) by mouth daily., Disp: 60 tablet, Rfl: 1   atorvastatin (LIPITOR) 40 MG tablet, Take 1  tablet (40 mg total) by mouth daily for antilipids, Disp: 90 tablet, Rfl: 1   ezetimibe (ZETIA) 10 MG tablet, Take 1 tablet (10 mg total) by mouth daily for antilipids, Disp: 90 tablet, Rfl: 1   isosorbide mononitrate (IMDUR) 30 MG 24 hr tablet, Take 2 tablets (60 mg total) by mouth daily for blood pressure, Disp: 180 tablet, Rfl: 1   labetalol (NORMODYNE) 300 MG tablet, Take 1 tablet (300 mg total) by mouth 2 (two) times daily for beta blockers, Disp: 180 tablet, Rfl: 1   levothyroxine (SYNTHROID) 25 MCG tablet, Take 1 tablet (25 mcg total) by mouth daily for low thyroid hormone, Disp: 90 tablet, Rfl: 1   omeprazole (PRILOSEC) 20 MG capsule, Take 2 capsules (40 mg total) by mouth daily for GERD, Disp: 180 capsule, Rfl: 1   ondansetron (ZOFRAN) 8 MG tablet, Take 1 tablet (8 mg total) by mouth every 8 (eight) hours as needed for nausea, Disp: 42 tablet, Rfl: 0   sevelamer carbonate (RENVELA) 800 MG tablet, Take 3 tablets (2,400 mg total) by mouth 3 (three) times daily with meals and  1 tablet with snacks., Disp: 1080 tablet, Rfl: 3   Accu-Chek Softclix Lancets lancets, Use to check blood sugar 3 times daily. (Patient not taking: Reported on 12/03/2023), Disp: 100 each, Rfl: 6   Blood Glucose Monitoring Suppl (ACCU-CHEK GUIDE) w/Device KIT, Use to check blood sugar 3 times daily. (Patient not taking: Reported on 12/03/2023), Disp: 1 kit, Rfl: 0   Continuous Glucose Receiver (FREESTYLE LIBRE 3 READER) DEVI, Use to check blood glucose continuously. Dx code: E11.59, Z79.4 (Patient not taking: Reported on 01/07/2024), Disp: 1 each, Rfl: 0   Continuous Glucose Sensor (FREESTYLE LIBRE 3 PLUS SENSOR) MISC, Change sensor every 15 days. E11.59, Z79.4 (Patient not taking: Reported on 01/07/2024), Disp: 2 each, Rfl: 11   glucose blood (ACCU-CHEK GUIDE) test strip, Use to check blood sugar 3 times daily. (Patient not taking: Reported on 12/03/2023), Disp: 100 each, Rfl: 6   insulin glargine (LANTUS SOLOSTAR) 100 UNIT/ML  Solostar Pen, Inject 3 Units into the skin 2 (two) times daily. (Patient not taking: Reported on 12/25/2023), Disp: 15 mL, Rfl: PRN   Insulin Pen Needle (PEN NEEDLES) 31G X 8 MM MISC, Use for injection with insulin pen daily. (Patient not taking: Reported on 12/25/2023), Disp: 100 each, Rfl: 6   Multiple Vitamin (MULTI-VITAMIN DAILY) TABS, Take 1 tablet by mouth at bedtime for supplementation (Patient not taking: Reported on 12/03/2023), Disp: 14 tablet, Rfl: 0   polyethylene glycol (MIRALAX / GLYCOLAX) 17 g packet, Give 17 gram by mouth one time a day for constipation (Patient not taking: Reported on 12/03/2023), Disp: 14 packet, Rfl: 0   traMADol (ULTRAM) 50 MG tablet, Take 1 tablet (50 mg total) by mouth every 6 (six) hours as needed. (Patient not taking: Reported on 01/07/2024), Disp: 6 tablet, Rfl: 0   Zinc 220 (50 Zn) MG CAPS, Take 1 capsule by mouth daily for minerals, electrolytes (Patient not taking: Reported on 12/03/2023), Disp: 14 capsule, Rfl: 0 Allergies  Allergen Reactions   Hydralazine Other (See Comments)   Hydralazine Hcl Other (See Comments)    Hair loss   Hydrocodone Itching and Other (See Comments)    Upset stomach   Metformin And Related Nausea And Vomiting and Other (See Comments)    Stomach pains, also   Other Nausea Only and Other (See Comments)    Lettuce- Does not digest this!!   Plaquenil [Hydroxychloroquine Sulfate] Hives   Shellfish Allergy Nausea And Vomiting   Shellfish-Derived Products Nausea Only and Other (See Comments)    Caused an upset stomach   Shrimp (Diagnostic) Nausea Only and Other (See Comments)    Upset stomach    Sulfa Antibiotics Hives   Sulfur Hives     Social History   Socioeconomic History   Marital status: Legally Separated    Spouse name: [name]   Number of children: 1   Years of education: some colle   Highest education level: Not on file  Occupational History   Occupation: Employed FT as Warden/ranger    Comment: [employer] , [place]   Tobacco Use   Smoking status: Former    Current packs/day: 0.25    Average packs/day: 0.3 packs/day for 0.5 years (0.1 ttl pk-yrs)    Types: Cigarettes    Passive exposure: Never   Smokeless tobacco: Never   Tobacco comments:    Former smoker 11/03/21  Vaping Use   Vaping status: Never Used  Substance and Sexual Activity   Alcohol use: No   Drug use: No   Sexual activity: Not  Currently    Birth control/protection: None  Other Topics Concern   Not on file  Social History Narrative   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    Social Drivers of Health   Financial Resource Strain: Low Risk  (10/22/2023)   Overall Financial Resource Strain (CARDIA)    Difficulty of Paying Living Expenses: Not very hard  Food Insecurity: No Food Insecurity (10/22/2023)   Hunger Vital Sign    Worried About Running Out of Food in the Last Year: Never true    Ran Out of Food in the Last Year: Never true  Transportation Needs: No Transportation Needs (10/22/2023)   PRAPARE - Administrator, Civil Service (Medical): No    Lack of Transportation (Non-Medical): No  Physical Activity: Inactive (10/22/2023)   Exercise Vital Sign    Days of Exercise per Week: 0 days    Minutes of Exercise per Session: 0 min  Stress: No Stress Concern Present (10/22/2023)   [employer] of Occupational Health - Occupational Stress Questionnaire    Feeling of Stress : Not at all  Social Connections: Unknown (10/22/2023)   Social Connection and Isolation Panel [NHANES]    Frequency of Communication with Friends and Family: More than three times a week    Frequency of Social Gatherings with Friends and Family: More than three times a week    Attends Religious Services: Not on file    Active Member of Clubs or Organizations: Yes    Attends Banker Meetings: Not on file    Marital Status: Married  Intimate Partner Violence: Not At Risk (10/22/2023)   Humiliation,  Afraid, Rape, and Kick questionnaire    Fear of Current or Ex-Partner: No    Emotionally Abused: No    Physically Abused: No    Sexually Abused: No    Physical Exam      Future Appointments  Date Time Provider Department Center  02/04/2024 10:10 AM Marcine Matar, MD [facility] None  02/06/2024  9:00 AM [facility]  02/06/2024 10:00 AM [facility]

## 2024-01-10 ENCOUNTER — Other Ambulatory Visit: Payer: Self-pay | Admitting: Internal Medicine

## 2024-01-11 ENCOUNTER — Other Ambulatory Visit: Payer: Self-pay | Admitting: Internal Medicine

## 2024-01-13 NOTE — Telephone Encounter (Signed)
Requested medication (s) are due for refill today: na  Requested medication (s) are on the active medication list: yes  Last refill:  01/02/24 #2 each 11 refills   Future visit scheduled: na   Notes to clinic:  Pharmacy comment: Alternative Requested:NEED DOCUMENTS FROM MEDICARE.      Requested Prescriptions  Pending Prescriptions Disp Refills   Continuous Glucose Sensor (FREESTYLE LIBRE 3 PLUS SENSOR) MISC [Pharmacy Med Name: FREESTYLE LIBRE 3 PLUS SENSOR] 2 each 11    Sig: Change sensor every 15 days. E11.59, Z79.4     There is no refill protocol information for this order

## 2024-01-14 ENCOUNTER — Other Ambulatory Visit (HOSPITAL_COMMUNITY): Payer: Self-pay | Admitting: Emergency Medicine

## 2024-01-14 ENCOUNTER — Other Ambulatory Visit: Payer: Self-pay | Admitting: Internal Medicine

## 2024-01-14 ENCOUNTER — Telehealth: Payer: Self-pay | Admitting: Pharmacist

## 2024-01-14 NOTE — Telephone Encounter (Signed)
Spoke with Nancy [name] of [facility] Paramedicine team. Pt's pharmacy is faxing over a form to be completed so they can bill her Medicare Part B for the [place] supplies. I wanted you all to have a heads up to be on the lookout.

## 2024-01-14 NOTE — Progress Notes (Signed)
Paramedicine Encounter    Patient ID: Nancy [name], female    DOB: [birth year], 63 y.o.   MRN: [id number]   Complaints NONE  Assessment A&O x 4, skin W&D w/ good color.  Denies chest pain or SOB.  Lung sounds clear throughout.  No edema noted.  Compliance with meds YES  Pill box filled  x 1 week  Refills needed Xanax  Meds changes since last visit NONE    Social changes NONE   BP (!) 160/90 (BP Location: Right Arm, Patient Position: Sitting)   Pulse 80   Wt 226 lb 3.2 oz (102.6 kg)   SpO2 100%   BMI 40.07 kg/m  Weight yesterday- NOT taken Last visit weight-229lb  Pt. CBG was 352.  Pt had pancakes w/ syrup and sugar in her coffee for breakfast.  She took 3 units of her Lantus.  [facility] call to [facility] her [facility] [place] sensors.  [facility] to [facility] Henriette Combs office a form that needs to be filled out in order to get approval.  ACTION: Home visit completed  Nancy [name] advises she feel "really good today."  She has taken her morning meds.  Her breakfast this morning consisted of pancakes w/ syrup and coffee w/ sugar.  CBG 352.  Once again discussed nutritional choices.  She took her Lantus today.   [facility] sensors.  Was advised that they are waiting on doctor to fill out a form so they can file her insurance.  Had them fax form to Dr. Laural Benes and hopefully this will be able to get approved soon. Med box reconciled x 1 week without incident.  Will take her a loaner glucometer to check her sugars with tomorrow pending the arrival of her Freestyle. Next home visit scheduled 2/4 @ 12:30  Nancy [name] [phone] 01/14/24  Patient Care Team: Marcine Matar, MD as PCP - General (Internal Medicine) Runell Gess, MD as PCP - Cardiology (Cardiology) Laurey Morale, MD as PCP - Advanced Heart Failure (Cardiology) Center, [facility]  Patient Active Problem List   Diagnosis Date  Noted   History of CVA (cerebrovascular accident) 03/22/2023   Community acquired pneumonia 03/22/2023   Pain, unspecified 08/15/2022   Complication of vascular dialysis catheter 08/07/2022   Chest pain 05/30/2022   Fluid overload 05/30/2022   Iron deficiency anemia, unspecified 04/26/2022   Nausea 04/10/2022   Hyponatremia 04/04/2022   Prolonged QT interval 04/04/2022   Anaphylactic shock, unspecified, initial encounter 02/23/2022   Other allergy, initial encounter 02/23/2022   Pressure injury of skin 02/19/2022   Anemia in chronic kidney disease 02/16/2022   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) 02/16/2022   Hyperlipidemia, unspecified 02/16/2022   Hypertensive heart and chronic kidney disease with heart failure and with stage 5 chronic kidney disease, or end stage renal disease (HCC) 02/16/2022   Personal history of nicotine dependence 02/16/2022   Personal history of transient ischemic attack (TIA), and cerebral infarction without residual deficits 02/16/2022   Secondary hyperparathyroidism of renal origin (HCC) 02/16/2022   Renal anasarca 02/14/2022   ESRD on dialysis (HCC) 02/14/2022   Hyperkalemia 02/14/2022   S/P arteriovenous (AV) fistula creation 12/22/2021   Peripheral artery disease (HCC) 10/27/2021   Acute exacerbation of congestive heart failure (HCC) 10/24/2021   Acute cardiogenic pulmonary edema (HCC) 10/06/2021   CAD (coronary artery disease) 10/06/2021   Goals of care, counseling/discussion    Gangrene of left foot (HCC)  Cellulitis of left foot    CHF (congestive heart failure) (HCC) 07/07/2021   Acute postoperative anemia due to expected blood loss superimposed on anemia of chronic renal insufficiency 06/30/2021   CKD (chronic kidney disease), stage IV (HCC) 06/11/2021   Chronic diastolic CHF (congestive heart failure) (HCC) 06/10/2021   Abnormal nuclear stress test    Dyspnea on exertion 03/07/2021   Orthopnea 02/25/2021   History of cerebrovascular accident  (CVA) with residual deficit 05/06/2020   AKI (acute kidney injury) (HCC) 04/20/2020   Generalized weakness 04/20/2020   Dehydration 04/20/2020   Generalized abdominal pain    Pancreatitis, acute 02/29/2020   Cerebrovascular accident (CVA) (HCC) 11/08/2019   Stenosis of right carotid artery 11/08/2019   Cortical age-related cataract of both eyes 08/30/2019   Gait abnormality 08/30/2019   Statin declined 08/30/2019   Gastroesophageal reflux disease without esophagitis 04/18/2018   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   Insulin dependent type 2 diabetes mellitus (HCC) 10/29/2017   History of macular degeneration 10/29/2017   Chronic cervical pain 10/29/2016   Myalgia 09/14/2016   Insomnia 09/14/2016   Tinea pedis of both feet 08/20/2016   Macular degeneration 08/17/2016   Low back pain 09/21/2014   Hypertensive urgency 09/21/2014   HTN (hypertension) 09/08/2014   Thyroid nodule 08/17/2014   Morbid obesity (HCC) 08/17/2014   Hypokalemia 08/06/2014   Type II diabetes mellitus with neurological manifestations, uncontrolled 08/04/2014    Current Outpatient Medications:    Accu-Chek Softclix Lancets lancets, Use to check blood sugar 3 times daily. (Patient not taking: Reported on 12/03/2023), Disp: 100 each, Rfl: 6   acetaminophen (TYLENOL) 500 MG tablet, Take 1,000 mg by mouth every 8 (eight) hours as needed for mild pain (pain score 1-3), moderate pain (pain score 4-6) or headache., Disp: , Rfl:    albuterol (VENTOLIN HFA) 108 (90 Base) MCG/ACT inhaler, Inhale 2 puffs into the lungs every 6 (six) hours as needed., Disp: 18 g, Rfl: 3   ALPRAZolam (XANAX) 0.25 MG tablet, Take 1 tablet (0.25 mg total) by mouth on Mon/Wed and Fri at Hemodialysis., Disp: 12 tablet, Rfl: 1   amLODipine (NORVASC) 10 MG tablet, Take 1 tablet (10 mg total) by mouth daily for hypertension, Disp: 90 tablet, Rfl: 1   aspirin 81 MG EC tablet, Take 1 tablet (81 mg total) by mouth daily., Disp: 60 tablet, Rfl: 1   atorvastatin  (LIPITOR) 40 MG tablet, Take 1 tablet (40 mg total) by mouth daily for antilipids, Disp: 90 tablet, Rfl: 1   Blood Glucose Monitoring Suppl (ACCU-CHEK GUIDE) w/Device KIT, Use to check blood sugar 3 times daily. (Patient not taking: Reported on 12/03/2023), Disp: 1 kit, Rfl: 0   Continuous Glucose Receiver (FREESTYLE LIBRE 3 READER) DEVI, Use to check blood glucose continuously. Dx code: E11.59, Z79.4 (Patient not taking: Reported on 01/07/2024), Disp: 1 each, Rfl: 0   Continuous Glucose Sensor (FREESTYLE LIBRE 3 PLUS SENSOR) MISC, Change sensor every 15 days. E11.59, Z79.4 (Patient not taking: Reported on 01/07/2024), Disp: 2 each, Rfl: 11   ezetimibe (ZETIA) 10 MG tablet, Take 1 tablet (10 mg total) by mouth daily for antilipids, Disp: 90 tablet, Rfl: 1   glucose blood (ACCU-CHEK GUIDE) test strip, Use to check blood sugar 3 times daily. (Patient not taking: Reported on 12/03/2023), Disp: 100 each, Rfl: 6   insulin glargine (LANTUS SOLOSTAR) 100 UNIT/ML Solostar Pen, Inject 3 Units into the skin 2 (two) times daily. (Patient not taking: Reported on 12/25/2023), Disp: 15 mL, Rfl: PRN  Insulin Pen Needle (PEN NEEDLES) 31G X 8 MM MISC, Use for injection with insulin pen daily. (Patient not taking: Reported on 12/25/2023), Disp: 100 each, Rfl: 6   isosorbide mononitrate (IMDUR) 30 MG 24 hr tablet, Take 2 tablets (60 mg total) by mouth daily for blood pressure, Disp: 180 tablet, Rfl: 1   labetalol (NORMODYNE) 300 MG tablet, Take 1 tablet (300 mg total) by mouth 2 (two) times daily for beta blockers, Disp: 180 tablet, Rfl: 1   levothyroxine (SYNTHROID) 25 MCG tablet, Take 1 tablet (25 mcg total) by mouth daily for low thyroid hormone, Disp: 90 tablet, Rfl: 1   Multiple Vitamin (MULTI-VITAMIN DAILY) TABS, Take 1 tablet by mouth at bedtime for supplementation (Patient not taking: Reported on 12/03/2023), Disp: 14 tablet, Rfl: 0   omeprazole (PRILOSEC) 20 MG capsule, Take 2 capsules (40 mg total) by mouth daily for  GERD, Disp: 180 capsule, Rfl: 1   ondansetron (ZOFRAN) 8 MG tablet, Take 1 tablet (8 mg total) by mouth every 8 (eight) hours as needed for nausea, Disp: 42 tablet, Rfl: 0   polyethylene glycol (MIRALAX / GLYCOLAX) 17 g packet, Give 17 gram by mouth one time a day for constipation (Patient not taking: Reported on 12/03/2023), Disp: 14 packet, Rfl: 0   sevelamer carbonate (RENVELA) 800 MG tablet, Take 3 tablets (2,400 mg total) by mouth 3 (three) times daily with meals and 1 tablet with snacks., Disp: 1080 tablet, Rfl: 3   traMADol (ULTRAM) 50 MG tablet, Take 1 tablet (50 mg total) by mouth every 6 (six) hours as needed. (Patient not taking: Reported on 01/07/2024), Disp: 6 tablet, Rfl: 0   Zinc 220 (50 Zn) MG CAPS, Take 1 capsule by mouth daily for minerals, electrolytes (Patient not taking: Reported on 12/03/2023), Disp: 14 capsule, Rfl: 0 Allergies  Allergen Reactions   Hydralazine Other (See Comments)   Hydralazine Hcl Other (See Comments)    Hair loss   Hydrocodone Itching and Other (See Comments)    Upset stomach   Metformin And Related Nausea And Vomiting and Other (See Comments)    Stomach pains, also   Other Nausea Only and Other (See Comments)    Lettuce- Does not digest this!!   Plaquenil [Hydroxychloroquine Sulfate] Hives   Shellfish Allergy Nausea And Vomiting   Shellfish-Derived Products Nausea Only and Other (See Comments)    Caused an upset stomach   Shrimp (Diagnostic) Nausea Only and Other (See Comments)    Upset stomach    Sulfa Antibiotics Hives   Sulfur Hives     Social History   Socioeconomic History   Marital status: Legally Separated    Spouse name: [name]   Number of children: 1   Years of education: some colle   Highest education level: Not on file  Occupational History   Occupation: Employed FT as Warden/ranger    Comment: [employer] , NA  Tobacco Use   Smoking status: Former    Current packs/day: 0.25    Average packs/day: 0.3 packs/day for 0.5 years  (0.1 ttl pk-yrs)    Types: Cigarettes    Passive exposure: Never   Smokeless tobacco: Never   Tobacco comments:    Former smoker 11/03/21  Vaping Use   Vaping status: Never Used  Substance and Sexual Activity   Alcohol use: No   Drug use: No   Sexual activity: Not Currently    Birth control/protection: None  Other Topics Concern   Not on file  Social History Narrative  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    Social Drivers of Corporate investment banker Strain: Low Risk  (10/22/2023)   Overall Financial Resource Strain (CARDIA)    Difficulty of Paying Living Expenses: Not very hard  Food Insecurity: No Food Insecurity (10/22/2023)   Hunger Vital Sign    Worried About Running Out of Food in the Last Year: Never true    Ran Out of Food in the Last Year: Never true  Transportation Needs: No Transportation Needs (10/22/2023)   PRAPARE - Administrator, Civil Service (Medical): No    Lack of Transportation (Non-Medical): No  Physical Activity: Inactive (10/22/2023)   Exercise Vital Sign    Days of Exercise per Week: 0 days    Minutes of Exercise per Session: 0 min  Stress: No Stress Concern Present (10/22/2023)   [employer] of Occupational Health - Occupational Stress Questionnaire    Feeling of Stress : Not at all  Social Connections: Unknown (10/22/2023)   Social Connection and Isolation Panel [NHANES]    Frequency of Communication with Friends and Family: More than three times a week    Frequency of Social Gatherings with Friends and Family: More than three times a week    Attends Religious Services: Not on file    Active Member of Clubs or Organizations: Yes    Attends Banker Meetings: Not on file    Marital Status: Married  Intimate Partner Violence: Not At Risk (10/22/2023)   Humiliation, Afraid, Rape, and Kick questionnaire    Fear of Current or Ex-Partner: No    Emotionally Abused: No    Physically  Abused: No    Sexually Abused: No    Physical Exam      Future Appointments  Date Time Provider Department Center  02/04/2024 10:10 AM Marcine Matar, MD [facility] None  02/06/2024  9:00 AM [facility]  02/06/2024 10:00 AM [facility]

## 2024-01-16 NOTE — Telephone Encounter (Signed)
Noted form will be completed once received.

## 2024-01-21 ENCOUNTER — Other Ambulatory Visit: Payer: Self-pay

## 2024-01-21 ENCOUNTER — Other Ambulatory Visit (HOSPITAL_COMMUNITY): Payer: Self-pay | Admitting: Emergency Medicine

## 2024-01-21 DIAGNOSIS — N186 End stage renal disease: Secondary | ICD-10-CM

## 2024-01-21 NOTE — Progress Notes (Signed)
 Paramedicine Encounter    Patient ID: Nancy [name], female    DOB: June 15, 1961, 63 y.o.   MRN: [id number]   Complaints NONE  Assessment A&O x 4, skin W&D w/ good color.  Denies chest pain or SOB.  Lung sounds clear throughout and no peripheral edema noted.  Compliance with meds YES  Pill box filled x 1 week  Refills needed NONE  Meds changes since last visit NONE    Social changes NONE   BP (!) 140/80 (BP Location: Right Arm, Patient Position: Sitting, Cuff Size: Normal)   Pulse 75   Resp 16   Wt 229 lb (103.9 kg)   SpO2 94%   BMI 40.57 kg/m  Weight yesterday-not taken Last visit weight-229lb CBG 217  Continue to discuss nutritional choices w/ Nancy [name].  She had a Location Manager sausage egg and cheese croissant for breakfast again this morning.  She has done well with her med compliance.  She denies chst pain or SOB.  Lung sounds clear and equal bilat.  No edema noted to her lower extremities. Still attempting to get her [employer] [facility].  Seems that prior authorization paperwork is still in process.  I reached out to Dr Ferdie office to f/u on the form that [facility] said they faxed but now word back on this yet.  ACTION: Home visit completed  [name] [phone] 01/21/24  Patient Care Team: [name], MD as PCP - General (Internal Medicine) Court Dorn PARAS, MD as PCP - Cardiology (Cardiology) Rolan Ezra RAMAN, MD as PCP - Advanced Heart Failure (Cardiology) Center, [facility]  Patient Active Problem List   Diagnosis Date Noted   History of CVA (cerebrovascular accident) 03/22/2023   Community acquired pneumonia 03/22/2023   Pain, unspecified 08/15/2022   Complication of vascular dialysis catheter 08/07/2022   Chest pain 05/30/2022   Fluid overload 05/30/2022   Iron  deficiency anemia, unspecified 04/26/2022   Nausea 04/10/2022   Hyponatremia 04/04/2022   Prolonged QT interval 04/04/2022   Anaphylactic  shock, unspecified, initial encounter 02/23/2022   Other allergy, initial encounter 02/23/2022   Pressure injury of skin 02/19/2022   Anemia in chronic kidney disease 02/16/2022   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) 02/16/2022   Hyperlipidemia, unspecified 02/16/2022   Hypertensive heart and chronic kidney disease with heart failure and with stage 5 chronic kidney disease, or end stage renal disease (HCC) 02/16/2022   Personal history of nicotine dependence 02/16/2022   Personal history of transient ischemic attack (TIA), and cerebral infarction without residual deficits 02/16/2022   Secondary hyperparathyroidism of renal origin (HCC) 02/16/2022   Renal anasarca 02/14/2022   ESRD on dialysis (HCC) 02/14/2022   Hyperkalemia 02/14/2022   S/P arteriovenous (AV) fistula creation 12/22/2021   Peripheral artery disease (HCC) 10/27/2021   Acute exacerbation of congestive heart failure (HCC) 10/24/2021   Acute cardiogenic pulmonary edema (HCC) 10/06/2021   CAD (coronary artery disease) 10/06/2021   Goals of care, counseling/discussion    Gangrene of left foot (HCC)    Cellulitis of left foot    CHF (congestive heart failure) (HCC) 07/07/2021   Acute postoperative anemia due to expected blood loss superimposed on anemia of chronic renal insufficiency 06/30/2021   CKD (chronic kidney disease), stage IV (HCC) 06/11/2021   Chronic diastolic CHF (congestive heart failure) (HCC) 06/10/2021   Abnormal nuclear stress test    Dyspnea on exertion 03/07/2021   Orthopnea 02/25/2021   History of cerebrovascular accident (CVA) with residual deficit 05/06/2020  AKI (acute kidney injury) (HCC) 04/20/2020   Generalized weakness 04/20/2020   Dehydration 04/20/2020   Generalized abdominal pain    Pancreatitis, acute 02/29/2020   Cerebrovascular accident (CVA) (HCC) 11/08/2019   Stenosis of right carotid artery 11/08/2019   Cortical age-related cataract of both eyes 08/30/2019   Gait abnormality 08/30/2019    Statin declined 08/30/2019   Gastroesophageal reflux disease without esophagitis 04/18/2018   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   Insulin  dependent type 2 diabetes mellitus (HCC) 10/29/2017   History of macular degeneration 10/29/2017   Chronic cervical pain 10/29/2016   Myalgia 09/14/2016   Insomnia 09/14/2016   Tinea pedis of both feet 08/20/2016   Macular degeneration 08/17/2016   Low back pain 09/21/2014   Hypertensive urgency 09/21/2014   HTN (hypertension) 09/08/2014   Thyroid  nodule 08/17/2014   Morbid obesity (HCC) 08/17/2014   Hypokalemia 08/06/2014   Type II diabetes mellitus with neurological manifestations, uncontrolled 08/04/2014    Current Outpatient Medications:    acetaminophen  (TYLENOL ) 500 MG tablet, Take 1,000 mg by mouth every 8 (eight) hours as needed for mild pain (pain score 1-3), moderate pain (pain score 4-6) or headache., Disp: , Rfl:    albuterol  (VENTOLIN  HFA) 108 (90 Base) MCG/ACT inhaler, Inhale 2 puffs into the lungs every 6 (six) hours as needed., Disp: 18 g, Rfl: 3   amLODipine  (NORVASC ) 10 MG tablet, Take 1 tablet (10 mg total) by mouth daily for hypertension, Disp: 90 tablet, Rfl: 1   aspirin  81 MG EC tablet, Take 1 tablet (81 mg total) by mouth daily., Disp: 60 tablet, Rfl: 1   atorvastatin  (LIPITOR) 40 MG tablet, Take 1 tablet (40 mg total) by mouth daily for antilipids, Disp: 90 tablet, Rfl: 1   ezetimibe  (ZETIA ) 10 MG tablet, Take 1 tablet (10 mg total) by mouth daily for antilipids, Disp: 90 tablet, Rfl: 1   Insulin  Pen Needle (PEN NEEDLES) 31G X 8 MM MISC, Use for injection with insulin  pen daily., Disp: 100 each, Rfl: 6   isosorbide  mononitrate (IMDUR ) 30 MG 24 hr tablet, Take 2 tablets (60 mg total) by mouth daily for blood pressure, Disp: 180 tablet, Rfl: 1   labetalol  (NORMODYNE ) 300 MG tablet, Take 1 tablet (300 mg total) by mouth 2 (two) times daily for beta blockers, Disp: 180 tablet, Rfl: 1   levothyroxine  (SYNTHROID ) 25 MCG tablet, Take 1  tablet (25 mcg total) by mouth daily for low thyroid  hormone, Disp: 90 tablet, Rfl: 1   omeprazole  (PRILOSEC) 20 MG capsule, Take 2 capsules (40 mg total) by mouth daily for GERD, Disp: 180 capsule, Rfl: 1   ondansetron  (ZOFRAN ) 8 MG tablet, Take 1 tablet (8 mg total) by mouth every 8 (eight) hours as needed for nausea, Disp: 42 tablet, Rfl: 0   sevelamer  carbonate (RENVELA ) 800 MG tablet, Take 3 tablets (2,400 mg total) by mouth 3 (three) times daily with meals and 1 tablet with snacks., Disp: 1080 tablet, Rfl: 3   Accu-Chek Softclix Lancets lancets, Use to check blood sugar 3 times daily. (Patient not taking: Reported on 12/03/2023), Disp: 100 each, Rfl: 6   ALPRAZolam  (XANAX ) 0.25 MG tablet, Take 1 tablet (0.25 mg total) by mouth on Mon/Wed and Fri at Hemodialysis., Disp: 12 tablet, Rfl: 1   Blood Glucose Monitoring Suppl (ACCU-CHEK GUIDE) w/Device KIT, Use to check blood sugar 3 times daily. (Patient not taking: Reported on 12/03/2023), Disp: 1 kit, Rfl: 0   Continuous Glucose Receiver (FREESTYLE LIBRE 3 READER) DEVI, Use to check blood  glucose continuously. Dx code: E11.59, Z79.4 (Patient not taking: Reported on 01/07/2024), Disp: 1 each, Rfl: 0   Continuous Glucose Sensor (FREESTYLE LIBRE 3 PLUS SENSOR) MISC, Change sensor every 15 days. E11.59, Z79.4 (Patient not taking: Reported on 01/07/2024), Disp: 2 each, Rfl: 11   glucose blood (ACCU-CHEK GUIDE) test strip, Use to check blood sugar 3 times daily. (Patient not taking: Reported on 12/03/2023), Disp: 100 each, Rfl: 6   insulin  glargine (LANTUS  SOLOSTAR) 100 UNIT/ML Solostar Pen, Inject 3 Units into the skin 2 (two) times daily. (Patient not taking: Reported on 01/21/2024), Disp: 15 mL, Rfl: PRN   Multiple Vitamin (MULTI-VITAMIN DAILY) TABS, Take 1 tablet by mouth at bedtime for supplementation (Patient not taking: Reported on 12/03/2023), Disp: 14 tablet, Rfl: 0   polyethylene glycol (MIRALAX  / GLYCOLAX ) 17 g packet, Give 17 gram by mouth one time a  day for constipation (Patient not taking: Reported on 12/03/2023), Disp: 14 packet, Rfl: 0   traMADol  (ULTRAM ) 50 MG tablet, Take 1 tablet (50 mg total) by mouth every 6 (six) hours as needed. (Patient not taking: Reported on 01/07/2024), Disp: 6 tablet, Rfl: 0   Zinc  220 (50 Zn) MG CAPS, Take 1 capsule by mouth daily for minerals, electrolytes (Patient not taking: Reported on 12/03/2023), Disp: 14 capsule, Rfl: 0 Allergies  Allergen Reactions   Hydralazine  Other (See Comments)   Hydralazine  Hcl Other (See Comments)    Hair loss   Hydrocodone Itching and Other (See Comments)    Upset stomach   Metformin  And Related Nausea And Vomiting and Other (See Comments)    Stomach pains, also   Other Nausea Only and Other (See Comments)    Lettuce- Does not digest this!!   Plaquenil [Hydroxychloroquine Sulfate] Hives   Shellfish Allergy Nausea And Vomiting   Shellfish-Derived Products Nausea Only and Other (See Comments)    Caused an upset stomach   Shrimp (Diagnostic) Nausea Only and Other (See Comments)    Upset stomach    Sulfa Antibiotics Hives   Sulfur  Hives     Social History   Socioeconomic History   Marital status: Legally Separated    Spouse name: Lamar   Number of children: 1   Years of education: some colle   Highest education level: Not on file  Occupational History   Occupation: Employed FT as warden/ranger    Comment: [employer] , NA  Tobacco Use   Smoking status: Former    Current packs/day: 0.25    Average packs/day: 0.3 packs/day for 0.5 years (0.1 ttl pk-yrs)    Types: Cigarettes    Passive exposure: Never   Smokeless tobacco: Never   Tobacco comments:    Former smoker 11/03/21  Vaping Use   Vaping status: Never Used  Substance and Sexual Activity   Alcohol use: No   Drug use: No   Sexual activity: Not Currently    Birth control/protection: None  Other Topics Concern   Not on file  Social History Narrative   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    Social Drivers of Corporate Investment Banker Strain: Low Risk  (10/22/2023)   Overall Financial Resource Strain (CARDIA)    Difficulty of Paying Living Expenses: Not very hard  Food Insecurity: No Food Insecurity (10/22/2023)   Hunger Vital Sign    Worried About Running Out of Food in the Last Year: Never true    Ran Out of Food in the Last Year: Never true  Transportation Needs: No Transportation Needs (10/22/2023)   PRAPARE - Administrator, Civil Service (Medical): No    Lack of Transportation (Non-Medical): No  Physical Activity: Inactive (10/22/2023)   Exercise Vital Sign    Days of Exercise per Week: 0 days    Minutes of Exercise per Session: 0 min  Stress: No Stress Concern Present (10/22/2023)   [employer] of Occupational Health - Occupational Stress Questionnaire    Feeling of Stress : Not at all  Social Connections: Unknown (10/22/2023)   Social Connection and Isolation Panel [NHANES]    Frequency of Communication with Friends and Family: More than three times a week    Frequency of Social Gatherings with Friends and Family: More than three times a week    Attends Religious Services: Not on file    Active Member of Clubs or Organizations: Yes    Attends Banker Meetings: Not on file    Marital Status: Married  Intimate Partner Violence: Not At Risk (10/22/2023)   Humiliation, Afraid, Rape, and Kick questionnaire    Fear of Current or Ex-Partner: No    Emotionally Abused: No    Physically Abused: No    Sexually Abused: No    Physical Exam      Future Appointments  Date Time Provider Department Center  02/04/2024 10:10 AM [name], MD [facility] None  02/06/2024  9:00 AM [facility]  02/06/2024 10:00 AM [facility]

## 2024-01-28 ENCOUNTER — Other Ambulatory Visit (HOSPITAL_COMMUNITY): Payer: Self-pay | Admitting: Emergency Medicine

## 2024-01-28 NOTE — Progress Notes (Signed)
Paramedicine Encounter    Patient ID: Nancy [name], female    DOB: 11-02-1961, 63 y.o.   MRN: [id number]   Complaints NONE  Assessment A&O x 4, skin W&D w/ good color.  Denies chest pain or SOB.  Lung sounds clear and equal bilat on no peripheral edema noted.  Compliance with meds YES  Pill box filled x 1 week  Refills needed Xanax  Meds changes since last visit NONE    Social changes NONE   BP 130/70 (BP Location: Right Arm, Patient Position: Sitting, Cuff Size: Normal)   Pulse 78   Resp 16   Wt 229 lb (103.9 kg) Comment: w/o prosthetics  SpO2 100% Comment: w/o supplemental O2  BMI 40.57 kg/m  Weight yesterday- not taken Last visit weight-229lb   Nancy [name] reports to be doing well today.  Denies chest pain or SOB.  Lung sounds clear bilat and no obvious edema noted.  Sent message to Jonah Blue for refill on Alprazolam.  Will follow up on this later in the day.  She has been taking this when she goes for her dialysis treatments to help w/ anxiety. Med box reconciled x 1 week.  Next home visit 02/04/24 @ 1:00/  ACTION: Home visit completed  Bethanie Dicker [phone] 01/28/24  Patient Care Team: Marcine Matar, MD as PCP - General (Internal Medicine) Runell Gess, MD as PCP - Cardiology (Cardiology) Laurey Morale, MD as PCP - Advanced Heart Failure (Cardiology) Center, [facility]  Patient Active Problem List   Diagnosis Date Noted   History of CVA (cerebrovascular accident) 03/22/2023   Community acquired pneumonia 03/22/2023   Pain, unspecified 08/15/2022   Complication of vascular dialysis catheter 08/07/2022   Chest pain 05/30/2022   Fluid overload 05/30/2022   Iron deficiency anemia, unspecified 04/26/2022   Nausea 04/10/2022   Hyponatremia 04/04/2022   Prolonged QT interval 04/04/2022   Anaphylactic shock, unspecified, initial encounter 02/23/2022   Other allergy, initial encounter 02/23/2022   Pressure  injury of skin 02/19/2022   Anemia in chronic kidney disease 02/16/2022   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) 02/16/2022   Hyperlipidemia, unspecified 02/16/2022   Hypertensive heart and chronic kidney disease with heart failure and with stage 5 chronic kidney disease, or end stage renal disease (HCC) 02/16/2022   Personal history of nicotine dependence 02/16/2022   Personal history of transient ischemic attack (TIA), and cerebral infarction without residual deficits 02/16/2022   Secondary hyperparathyroidism of renal origin (HCC) 02/16/2022   Renal anasarca 02/14/2022   ESRD on dialysis (HCC) 02/14/2022   Hyperkalemia 02/14/2022   S/P arteriovenous (AV) fistula creation 12/22/2021   Peripheral artery disease (HCC) 10/27/2021   Acute exacerbation of congestive heart failure (HCC) 10/24/2021   Acute cardiogenic pulmonary edema (HCC) 10/06/2021   CAD (coronary artery disease) 10/06/2021   Goals of care, counseling/discussion    Gangrene of left foot (HCC)    Cellulitis of left foot    CHF (congestive heart failure) (HCC) 07/07/2021   Acute postoperative anemia due to expected blood loss superimposed on anemia of chronic renal insufficiency 06/30/2021   CKD (chronic kidney disease), stage IV (HCC) 06/11/2021   Chronic diastolic CHF (congestive heart failure) (HCC) 06/10/2021   Abnormal nuclear stress test    Dyspnea on exertion 03/07/2021   Orthopnea 02/25/2021   History of cerebrovascular accident (CVA) with residual deficit 05/06/2020   AKI (acute kidney injury) (HCC) 04/20/2020   Generalized weakness 04/20/2020   Dehydration 04/20/2020   Generalized  abdominal pain    Pancreatitis, acute 02/29/2020   Cerebrovascular accident (CVA) (HCC) 11/08/2019   Stenosis of right carotid artery 11/08/2019   Cortical age-related cataract of both eyes 08/30/2019   Gait abnormality 08/30/2019   Statin declined 08/30/2019   Gastroesophageal reflux disease without esophagitis 04/18/2018   Parotid  tumor 04/18/2018   Insulin dependent type 2 diabetes mellitus (HCC) 10/29/2017   History of macular degeneration 10/29/2017   Chronic cervical pain 10/29/2016   Myalgia 09/14/2016   Insomnia 09/14/2016   Tinea pedis of both feet 08/20/2016   Macular degeneration 08/17/2016   Low back pain 09/21/2014   Hypertensive urgency 09/21/2014   HTN (hypertension) 09/08/2014   Thyroid nodule 08/17/2014   Morbid obesity (HCC) 08/17/2014   Hypokalemia 08/06/2014   Type II diabetes mellitus with neurological manifestations, uncontrolled 08/04/2014    Current Outpatient Medications:    Accu-Chek Softclix Lancets lancets, Use to check blood sugar 3 times daily. (Patient not taking: Reported on 12/03/2023), Disp: 100 each, Rfl: 6   acetaminophen (TYLENOL) 500 MG tablet, Take 1,000 mg by mouth every 8 (eight) hours as needed for mild pain (pain score 1-3), moderate pain (pain score 4-6) or headache., Disp: , Rfl:    albuterol (VENTOLIN HFA) 108 (90 Base) MCG/ACT inhaler, Inhale 2 puffs into the lungs every 6 (six) hours as needed., Disp: 18 g, Rfl: 3   ALPRAZolam (XANAX) 0.25 MG tablet, Take 1 tablet (0.25 mg total) by mouth on Mon/Wed and Fri at Hemodialysis., Disp: 12 tablet, Rfl: 1   amLODipine (NORVASC) 10 MG tablet, Take 1 tablet (10 mg total) by mouth daily for hypertension, Disp: 90 tablet, Rfl: 1   aspirin 81 MG EC tablet, Take 1 tablet (81 mg total) by mouth daily., Disp: 60 tablet, Rfl: 1   atorvastatin (LIPITOR) 40 MG tablet, Take 1 tablet (40 mg total) by mouth daily for antilipids, Disp: 90 tablet, Rfl: 1   Blood Glucose Monitoring Suppl (ACCU-CHEK GUIDE) w/Device KIT, Use to check blood sugar 3 times daily. (Patient not taking: Reported on 12/03/2023), Disp: 1 kit, Rfl: 0   Continuous Glucose Receiver (FREESTYLE LIBRE 3 READER) DEVI, Use to check blood glucose continuously. Dx code: E11.59, Z79.4 (Patient not taking: Reported on 01/07/2024), Disp: 1 each, Rfl: 0   Continuous Glucose Sensor  (FREESTYLE LIBRE 3 PLUS SENSOR) MISC, Change sensor every 15 days. E11.59, Z79.4 (Patient not taking: Reported on 01/07/2024), Disp: 2 each, Rfl: 11   ezetimibe (ZETIA) 10 MG tablet, Take 1 tablet (10 mg total) by mouth daily for antilipids, Disp: 90 tablet, Rfl: 1   glucose blood (ACCU-CHEK GUIDE) test strip, Use to check blood sugar 3 times daily. (Patient not taking: Reported on 12/03/2023), Disp: 100 each, Rfl: 6   insulin glargine (LANTUS SOLOSTAR) 100 UNIT/ML Solostar Pen, Inject 3 Units into the skin 2 (two) times daily. (Patient not taking: Reported on 01/21/2024), Disp: 15 mL, Rfl: PRN   Insulin Pen Needle (PEN NEEDLES) 31G X 8 MM MISC, Use for injection with insulin pen daily., Disp: 100 each, Rfl: 6   isosorbide mononitrate (IMDUR) 30 MG 24 hr tablet, Take 2 tablets (60 mg total) by mouth daily for blood pressure, Disp: 180 tablet, Rfl: 1   labetalol (NORMODYNE) 300 MG tablet, Take 1 tablet (300 mg total) by mouth 2 (two) times daily for beta blockers, Disp: 180 tablet, Rfl: 1   levothyroxine (SYNTHROID) 25 MCG tablet, Take 1 tablet (25 mcg total) by mouth daily for low thyroid hormone, Disp: 90  tablet, Rfl: 1   Multiple Vitamin (MULTI-VITAMIN DAILY) TABS, Take 1 tablet by mouth at bedtime for supplementation (Patient not taking: Reported on 12/03/2023), Disp: 14 tablet, Rfl: 0   omeprazole (PRILOSEC) 20 MG capsule, Take 2 capsules (40 mg total) by mouth daily for GERD, Disp: 180 capsule, Rfl: 1   ondansetron (ZOFRAN) 8 MG tablet, Take 1 tablet (8 mg total) by mouth every 8 (eight) hours as needed for nausea, Disp: 42 tablet, Rfl: 0   polyethylene glycol (MIRALAX / GLYCOLAX) 17 g packet, Give 17 gram by mouth one time a day for constipation (Patient not taking: Reported on 12/03/2023), Disp: 14 packet, Rfl: 0   sevelamer carbonate (RENVELA) 800 MG tablet, Take 3 tablets (2,400 mg total) by mouth 3 (three) times daily with meals and 1 tablet with snacks., Disp: 1080 tablet, Rfl: 3   traMADol  (ULTRAM) 50 MG tablet, Take 1 tablet (50 mg total) by mouth every 6 (six) hours as needed. (Patient not taking: Reported on 01/07/2024), Disp: 6 tablet, Rfl: 0   Zinc 220 (50 Zn) MG CAPS, Take 1 capsule by mouth daily for minerals, electrolytes (Patient not taking: Reported on 12/03/2023), Disp: 14 capsule, Rfl: 0 Allergies  Allergen Reactions   Hydralazine Other (See Comments)   Hydralazine Hcl Other (See Comments)    Hair loss   Hydrocodone Itching and Other (See Comments)    Upset stomach   Metformin And Related Nausea And Vomiting and Other (See Comments)    Stomach pains, also   Other Nausea Only and Other (See Comments)    Lettuce- Does not digest this!!   Plaquenil [Hydroxychloroquine Sulfate] Hives   Shellfish Allergy Nausea And Vomiting   Shellfish-Derived Products Nausea Only and Other (See Comments)    Caused an upset stomach   Shrimp (Diagnostic) Nausea Only and Other (See Comments)    Upset stomach    Sulfa Antibiotics Hives   Sulfur Hives     Social History   Socioeconomic History   Marital status: Legally Separated    Spouse name: [name]   Number of children: 1   Years of education: some colle   Highest education level: Not on file  Occupational History   Occupation: Employed FT as Warden/ranger    Comment: [employer] , NA  Tobacco Use   Smoking status: Former    Current packs/day: 0.25    Average packs/day: 0.3 packs/day for 0.5 years (0.1 ttl pk-yrs)    Types: Cigarettes    Passive exposure: Never   Smokeless tobacco: Never   Tobacco comments:    Former smoker 11/03/21  Vaping Use   Vaping status: Never Used  Substance and Sexual Activity   Alcohol use: No   Drug use: No   Sexual activity: Not Currently    Birth control/protection: None  Other Topics Concern   Not on file  Social History Narrative   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    Social Drivers of Corporate investment banker Strain: Low Risk   (10/22/2023)   Overall Financial Resource Strain (CARDIA)    Difficulty of Paying Living Expenses: Not very hard  Food Insecurity: No Food Insecurity (10/22/2023)   Hunger Vital Sign    Worried About Running Out of Food in the Last Year: Never true    Ran Out of Food in the Last Year: Never true  Transportation Needs: No Transportation Needs (10/22/2023)   PRAPARE - Transportation    Lack of Transportation (  Medical): No    Lack of Transportation (Non-Medical): No  Physical Activity: Inactive (10/22/2023)   Exercise Vital Sign    Days of Exercise per Week: 0 days    Minutes of Exercise per Session: 0 min  Stress: No Stress Concern Present (10/22/2023)   [employer] of Occupational Health - Occupational Stress Questionnaire    Feeling of Stress : Not at all  Social Connections: Unknown (10/22/2023)   Social Connection and Isolation Panel [NHANES]    Frequency of Communication with Friends and Family: More than three times a week    Frequency of Social Gatherings with Friends and Family: More than three times a week    Attends Religious Services: Not on file    Active Member of Clubs or Organizations: Yes    Attends Banker Meetings: Not on file    Marital Status: Married  Intimate Partner Violence: Not At Risk (10/22/2023)   Humiliation, Afraid, Rape, and Kick questionnaire    Fear of Current or Ex-Partner: No    Emotionally Abused: No    Physically Abused: No    Sexually Abused: No    Physical Exam      Future Appointments  Date Time Provider Department Center  02/04/2024 10:10 AM Marcine Matar, MD [facility] None  02/06/2024  9:00 AM [facility]  02/06/2024 10:00 AM [facility]

## 2024-01-29 ENCOUNTER — Other Ambulatory Visit (HOSPITAL_COMMUNITY): Payer: Self-pay

## 2024-01-29 ENCOUNTER — Telehealth: Payer: Self-pay

## 2024-01-29 ENCOUNTER — Telehealth: Payer: Self-pay | Admitting: Pharmacist

## 2024-01-29 DIAGNOSIS — F411 Generalized anxiety disorder: Secondary | ICD-10-CM

## 2024-01-29 MED ORDER — ALPRAZOLAM 0.25 MG PO TABS
0.2500 mg | ORAL_TABLET | ORAL | 2 refills | Status: DC | PRN
  Filled 2024-01-29: qty 12, 28d supply, fill #0
  Filled 2024-02-28: qty 12, 28d supply, fill #1
  Filled 2024-03-24 – 2024-03-25 (×2): qty 12, 28d supply, fill #2

## 2024-01-29 NOTE — Telephone Encounter (Signed)
noted

## 2024-01-29 NOTE — Addendum Note (Signed)
Addended by: Jonah Blue B on: 01/29/2024 04:14 PM   Modules accepted: Orders

## 2024-01-29 NOTE — Telephone Encounter (Signed)
Message received from Beatrix Shipper, Paramedic, [name] reports that  Mrs. Volpi would like to get a refill on her Alprazolam .25mg  She has been taking Mon/Wed/Fri during dialysis to help w/ anxiety.  She uses [facility] [phone].   Please advise.

## 2024-01-29 NOTE — Telephone Encounter (Signed)
I do not think so.  Not in forms that I have completed today.

## 2024-01-29 NOTE — Telephone Encounter (Signed)
Clarisa,   Have you received a fax from patient's pharmacy regarding her CGM supplies? Her pharmacy keeps "faxing" [place] but I have not received anything. Was just checking to see if you have gotten anything sent your way. There is a form her pharmacy needs completed for them to bill her Medicare part B.

## 2024-01-29 NOTE — Telephone Encounter (Signed)
RF sent.

## 2024-01-30 ENCOUNTER — Other Ambulatory Visit (HOSPITAL_COMMUNITY): Payer: Self-pay

## 2024-01-30 NOTE — Telephone Encounter (Signed)
I have not received anything either. I did however resend her faxes today 01/30/24, that were received earlier this month just in case they did not receive it before.

## 2024-02-04 ENCOUNTER — Ambulatory Visit: Payer: Medicare Other | Attending: Internal Medicine | Admitting: Internal Medicine

## 2024-02-04 ENCOUNTER — Other Ambulatory Visit (HOSPITAL_COMMUNITY): Payer: Self-pay | Admitting: Emergency Medicine

## 2024-02-04 ENCOUNTER — Encounter: Payer: Self-pay | Admitting: Internal Medicine

## 2024-02-04 VITALS — BP 130/73 | HR 76 | Temp 97.9°F | Ht 63.0 in

## 2024-02-04 DIAGNOSIS — I5032 Chronic diastolic (congestive) heart failure: Secondary | ICD-10-CM | POA: Insufficient documentation

## 2024-02-04 DIAGNOSIS — I272 Pulmonary hypertension, unspecified: Secondary | ICD-10-CM | POA: Diagnosis not present

## 2024-02-04 DIAGNOSIS — E1136 Type 2 diabetes mellitus with diabetic cataract: Secondary | ICD-10-CM | POA: Insufficient documentation

## 2024-02-04 DIAGNOSIS — Z9981 Dependence on supplemental oxygen: Secondary | ICD-10-CM

## 2024-02-04 DIAGNOSIS — Z23 Encounter for immunization: Secondary | ICD-10-CM

## 2024-02-04 DIAGNOSIS — Z89512 Acquired absence of left leg below knee: Secondary | ICD-10-CM | POA: Insufficient documentation

## 2024-02-04 DIAGNOSIS — E1122 Type 2 diabetes mellitus with diabetic chronic kidney disease: Secondary | ICD-10-CM | POA: Insufficient documentation

## 2024-02-04 DIAGNOSIS — E1159 Type 2 diabetes mellitus with other circulatory complications: Secondary | ICD-10-CM | POA: Insufficient documentation

## 2024-02-04 DIAGNOSIS — E1142 Type 2 diabetes mellitus with diabetic polyneuropathy: Secondary | ICD-10-CM | POA: Diagnosis not present

## 2024-02-04 DIAGNOSIS — Z8673 Personal history of transient ischemic attack (TIA), and cerebral infarction without residual deficits: Secondary | ICD-10-CM | POA: Diagnosis not present

## 2024-02-04 DIAGNOSIS — Z89511 Acquired absence of right leg below knee: Secondary | ICD-10-CM | POA: Insufficient documentation

## 2024-02-04 DIAGNOSIS — N186 End stage renal disease: Secondary | ICD-10-CM | POA: Diagnosis not present

## 2024-02-04 DIAGNOSIS — K3184 Gastroparesis: Secondary | ICD-10-CM | POA: Diagnosis not present

## 2024-02-04 DIAGNOSIS — E1143 Type 2 diabetes mellitus with diabetic autonomic (poly)neuropathy: Secondary | ICD-10-CM | POA: Insufficient documentation

## 2024-02-04 DIAGNOSIS — Z6841 Body Mass Index (BMI) 40.0 and over, adult: Secondary | ICD-10-CM | POA: Insufficient documentation

## 2024-02-04 DIAGNOSIS — J9611 Chronic respiratory failure with hypoxia: Secondary | ICD-10-CM | POA: Diagnosis not present

## 2024-02-04 DIAGNOSIS — I10 Essential (primary) hypertension: Secondary | ICD-10-CM | POA: Diagnosis present

## 2024-02-04 DIAGNOSIS — Z794 Long term (current) use of insulin: Secondary | ICD-10-CM | POA: Diagnosis not present

## 2024-02-04 DIAGNOSIS — I2510 Atherosclerotic heart disease of native coronary artery without angina pectoris: Secondary | ICD-10-CM | POA: Insufficient documentation

## 2024-02-04 DIAGNOSIS — E785 Hyperlipidemia, unspecified: Secondary | ICD-10-CM | POA: Diagnosis not present

## 2024-02-04 DIAGNOSIS — H029 Unspecified disorder of eyelid: Secondary | ICD-10-CM

## 2024-02-04 DIAGNOSIS — Z992 Dependence on renal dialysis: Secondary | ICD-10-CM | POA: Insufficient documentation

## 2024-02-04 DIAGNOSIS — Z95810 Presence of automatic (implantable) cardiac defibrillator: Secondary | ICD-10-CM | POA: Insufficient documentation

## 2024-02-04 DIAGNOSIS — I132 Hypertensive heart and chronic kidney disease with heart failure and with stage 5 chronic kidney disease, or end stage renal disease: Secondary | ICD-10-CM | POA: Diagnosis not present

## 2024-02-04 DIAGNOSIS — F418 Other specified anxiety disorders: Secondary | ICD-10-CM | POA: Diagnosis not present

## 2024-02-04 DIAGNOSIS — H0289 Other specified disorders of eyelid: Secondary | ICD-10-CM | POA: Diagnosis not present

## 2024-02-04 MED ORDER — ZOSTER VAC RECOMB ADJUVANTED 50 MCG/0.5ML IM SUSR
0.5000 mL | Freq: Once | INTRAMUSCULAR | 0 refills | Status: AC
Start: 2024-02-04 — End: 2024-02-04

## 2024-02-04 NOTE — Progress Notes (Unsigned)
Paramedicine Encounter    Patient ID: Nancy [name], female    DOB: October 22, 1961, 63 y.o.   MRN: [id number]   Complaints NONE  Assessment A&O x 4, skin W&D w/ good color.  Denies chest pain or SOB.    Compliance with medsm YES  Pill box filled x 1 week  Refills needed NONE  Meds changes since last visit NONE    Social changes NONE   BP (!) 136/0 (BP Location: Right Arm, Patient Position: Sitting, Cuff Size: Normal) Comment: palpated  Pulse 78   Resp 16   Wt 237 lb 3.2 oz (107.6 kg)   SpO2 96%   BMI 42.02 kg/m  Weight yesterday- Not taken Last visit weight- 229lb  ATF Nancy [name] A&O x 4, skin W&D w/ good color.  She has done well with her med compliance.  We are still waiting on prior authorization for her to get her [employer].  In the meantime, I have provided her with a glucometer so that she can monitor her blood sugars. Med box reconciled x 1 week.    ACTION: Home visit completed  Bethanie Dicker [phone] 02/04/24  Patient Care Team: Marcine Matar, MD as PCP - General (Internal Medicine) Runell Gess, MD as PCP - Cardiology (Cardiology) Laurey Morale, MD as PCP - Advanced Heart Failure (Cardiology) Center, [facility]  Patient Active Problem List   Diagnosis Date Noted   S/P bilateral BKA (below knee amputation) (HCC) 02/04/2024   History of CVA (cerebrovascular accident) 03/22/2023   Community acquired pneumonia 03/22/2023   Pain, unspecified 08/15/2022   Complication of vascular dialysis catheter 08/07/2022   Chest pain 05/30/2022   Fluid overload 05/30/2022   Iron deficiency anemia, unspecified 04/26/2022   Nausea 04/10/2022   Hyponatremia 04/04/2022   Prolonged QT interval 04/04/2022   Anaphylactic shock, unspecified, initial encounter 02/23/2022   Other allergy, initial encounter 02/23/2022   Pressure injury of skin 02/19/2022   Anemia in chronic kidney disease 02/16/2022   Dependence on renal  dialysis (HCC) 02/16/2022   Hyperlipidemia, unspecified 02/16/2022   Hypertensive heart and chronic kidney disease with heart failure and with stage 5 chronic kidney disease, or end stage renal disease (HCC) 02/16/2022   Personal history of nicotine dependence 02/16/2022   Personal history of transient ischemic attack (TIA), and cerebral infarction without residual deficits 02/16/2022   Secondary hyperparathyroidism of renal origin (HCC) 02/16/2022   Renal anasarca 02/14/2022   ESRD on dialysis (HCC) 02/14/2022   Hyperkalemia 02/14/2022   S/P arteriovenous (AV) fistula creation 12/22/2021   Peripheral artery disease (HCC) 10/27/2021   Acute exacerbation of congestive heart failure (HCC) 10/24/2021   Acute cardiogenic pulmonary edema (HCC) 10/06/2021   CAD (coronary artery disease) 10/06/2021   Goals of care, counseling/discussion    Gangrene of left foot (HCC)    Cellulitis of left foot    CHF (congestive heart failure) (HCC) 07/07/2021   Acute postoperative anemia due to expected blood loss superimposed on anemia of chronic renal insufficiency 06/30/2021   CKD (chronic kidney disease), stage IV (HCC) 06/11/2021   Chronic diastolic CHF (congestive heart failure) (HCC) 06/10/2021   Abnormal nuclear stress test    Dyspnea on exertion 03/07/2021   Orthopnea 02/25/2021   History of cerebrovascular accident (CVA) with residual deficit 05/06/2020   AKI (acute kidney injury) (HCC) 04/20/2020   Generalized weakness 04/20/2020   Dehydration 04/20/2020   Generalized abdominal pain    Pancreatitis, acute 02/29/2020   Cerebrovascular accident (  CVA) (HCC) 11/08/2019   Stenosis of right carotid artery 11/08/2019   Cortical age-related cataract of both eyes 08/30/2019   Gait abnormality 08/30/2019   Statin declined 08/30/2019   Gastroesophageal reflux disease without esophagitis 04/18/2018   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   Insulin dependent type 2 diabetes mellitus (HCC) 10/29/2017   History of  macular degeneration 10/29/2017   Chronic cervical pain 10/29/2016   Myalgia 09/14/2016   Insomnia 09/14/2016   Tinea pedis of both feet 08/20/2016   Macular degeneration 08/17/2016   Low back pain 09/21/2014   Hypertensive urgency 09/21/2014   HTN (hypertension) 09/08/2014   Thyroid nodule 08/17/2014   Morbid obesity (HCC) 08/17/2014   Hypokalemia 08/06/2014   Type II diabetes mellitus with neurological manifestations, uncontrolled 08/04/2014    Current Outpatient Medications:    Accu-Chek Softclix Lancets lancets, Use to check blood sugar 3 times daily., Disp: 100 each, Rfl: 6   acetaminophen (TYLENOL) 500 MG tablet, Take 1,000 mg by mouth every 8 (eight) hours as needed for mild pain (pain score 1-3), moderate pain (pain score 4-6) or headache., Disp: , Rfl:    albuterol (VENTOLIN HFA) 108 (90 Base) MCG/ACT inhaler, Inhale 2 puffs into the lungs every 6 (six) hours as needed., Disp: 18 g, Rfl: 3   ALPRAZolam (XANAX) 0.25 MG tablet, Take 1 tablet (0.25 mg total) by mouth on Monday, Wednesday and Friday at Hemodialysis., Disp: 12 tablet, Rfl: 2   amLODipine (NORVASC) 10 MG tablet, Take 1 tablet (10 mg total) by mouth daily for hypertension, Disp: 90 tablet, Rfl: 1   aspirin 81 MG EC tablet, Take 1 tablet (81 mg total) by mouth daily., Disp: 60 tablet, Rfl: 1   atorvastatin (LIPITOR) 40 MG tablet, Take 1 tablet (40 mg total) by mouth daily for antilipids, Disp: 90 tablet, Rfl: 1   Blood Glucose Monitoring Suppl (ACCU-CHEK GUIDE) w/Device KIT, Use to check blood sugar 3 times daily., Disp: 1 kit, Rfl: 0   ezetimibe (ZETIA) 10 MG tablet, Take 1 tablet (10 mg total) by mouth daily for antilipids, Disp: 90 tablet, Rfl: 1   insulin glargine (LANTUS SOLOSTAR) 100 UNIT/ML Solostar Pen, Inject 3 Units into the skin 2 (two) times daily., Disp: 15 mL, Rfl: PRN   Insulin Pen Needle (PEN NEEDLES) 31G X 8 MM MISC, Use for injection with insulin pen daily., Disp: 100 each, Rfl: 6   isosorbide  mononitrate (IMDUR) 30 MG 24 hr tablet, Take 2 tablets (60 mg total) by mouth daily for blood pressure, Disp: 180 tablet, Rfl: 1   labetalol (NORMODYNE) 300 MG tablet, Take 1 tablet (300 mg total) by mouth 2 (two) times daily for beta blockers, Disp: 180 tablet, Rfl: 1   levothyroxine (SYNTHROID) 25 MCG tablet, Take 1 tablet (25 mcg total) by mouth daily for low thyroid hormone, Disp: 90 tablet, Rfl: 1   omeprazole (PRILOSEC) 20 MG capsule, Take 2 capsules (40 mg total) by mouth daily for GERD, Disp: 180 capsule, Rfl: 1   ondansetron (ZOFRAN) 8 MG tablet, Take 1 tablet (8 mg total) by mouth every 8 (eight) hours as needed for nausea, Disp: 42 tablet, Rfl: 0   sevelamer carbonate (RENVELA) 800 MG tablet, Take 3 tablets (2,400 mg total) by mouth 3 (three) times daily with meals and 1 tablet with snacks., Disp: 1080 tablet, Rfl: 3   Continuous Glucose Receiver (FREESTYLE LIBRE 3 READER) DEVI, Use to check blood glucose continuously. Dx code: E11.59, Z79.4, Disp: 1 each, Rfl: 0   Continuous Glucose Sensor (  FREESTYLE LIBRE 3 PLUS SENSOR) MISC, Change sensor every 15 days. E11.59, Z79.4, Disp: 2 each, Rfl: 11   glucose blood (ACCU-CHEK GUIDE) test strip, Use to check blood sugar 3 times daily., Disp: 100 each, Rfl: 6   Multiple Vitamin (MULTI-VITAMIN DAILY) TABS, Take 1 tablet by mouth at bedtime for supplementation (Patient not taking: Reported on 02/04/2024), Disp: 14 tablet, Rfl: 0   polyethylene glycol (MIRALAX / GLYCOLAX) 17 g packet, Give 17 gram by mouth one time a day for constipation (Patient not taking: Reported on 02/04/2024), Disp: 14 packet, Rfl: 0   traMADol (ULTRAM) 50 MG tablet, Take 1 tablet (50 mg total) by mouth every 6 (six) hours as needed. (Patient not taking: Reported on 02/04/2024), Disp: 6 tablet, Rfl: 0   Zinc 220 (50 Zn) MG CAPS, Take 1 capsule by mouth daily for minerals, electrolytes (Patient not taking: Reported on 02/04/2024), Disp: 14 capsule, Rfl: 0   Zoster Vaccine Adjuvanted  (SHINGRIX) injection, Inject 0.5 mLs into the muscle once for 1 dose. (Patient not taking: Reported on 02/04/2024), Disp: 0.5 mL, Rfl: 0 Allergies  Allergen Reactions   Hydralazine Other (See Comments)   Hydralazine Hcl Other (See Comments)    Hair loss   Hydrocodone Itching and Other (See Comments)    Upset stomach   Metformin And Related Nausea And Vomiting and Other (See Comments)    Stomach pains, also   Other Nausea Only and Other (See Comments)    Lettuce- Does not digest this!!   Plaquenil [Hydroxychloroquine Sulfate] Hives   Shellfish Allergy Nausea And Vomiting   Shellfish-Derived Products Nausea Only and Other (See Comments)    Caused an upset stomach   Shrimp (Diagnostic) Nausea Only and Other (See Comments)    Upset stomach    Sulfa Antibiotics Hives   Sulfur Hives     Social History   Socioeconomic History   Marital status: Legally Separated    Spouse name: [name]   Number of children: 1   Years of education: some colle   Highest education level: Not on file  Occupational History   Occupation: Employed FT as Warden/ranger    Comment: [employer] , NA  Tobacco Use   Smoking status: Former    Current packs/day: 0.25    Average packs/day: 0.3 packs/day for 0.5 years (0.1 ttl pk-yrs)    Types: Cigarettes    Passive exposure: Never   Smokeless tobacco: Never   Tobacco comments:    Former smoker 11/03/21  Vaping Use   Vaping status: Never Used  Substance and Sexual Activity   Alcohol use: No   Drug use: No   Sexual activity: Not Currently    Birth control/protection: None  Other Topics Concern   Not on file  Social History Narrative   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    Social Drivers of Health   Financial Resource Strain: Low Risk  (02/04/2024)   Overall Financial Resource Strain (CARDIA)    Difficulty of Paying Living Expenses: Not hard at all  Food Insecurity: No Food Insecurity (02/04/2024)   Hunger Vital Sign     Worried About Running Out of Food in the Last Year: Never true    Ran Out of Food in the Last Year: Never true  Transportation Needs: No Transportation Needs (02/04/2024)   PRAPARE - Administrator, Civil Service (Medical): No    Lack of Transportation (Non-Medical): No  Physical Activity: Inactive (02/04/2024)   Exercise  Vital Sign    Days of Exercise per Week: 0 days    Minutes of Exercise per Session: 0 min  Stress: No Stress Concern Present (02/04/2024)   [employer] of Occupational Health - Occupational Stress Questionnaire    Feeling of Stress : Not at all  Social Connections: Moderately Integrated (02/04/2024)   Social Connection and Isolation Panel [NHANES]    Frequency of Communication with Friends and Family: Twice a week    Frequency of Social Gatherings with Friends and Family: Once a week    Attends Religious Services: Never    Database administrator or Organizations: Yes    Attends Banker Meetings: Never    Marital Status: Married  Catering manager Violence: Not At Risk (02/04/2024)   Humiliation, Afraid, Rape, and Kick questionnaire    Fear of Current or Ex-Partner: No    Emotionally Abused: No    Physically Abused: No    Sexually Abused: No    Physical Exam      Future Appointments  Date Time Provider Department Center  02/06/2024  1:00 PM [facility]  02/06/2024  1:45 PM [facility]  06/04/2024 11:10 AM Marcine Matar, MD [facility] None

## 2024-02-04 NOTE — Progress Notes (Signed)
Patient ID: WAFA MARTES, female    DOB: 11/10/1961  MRN: [id number]  CC: Hypertension (HTN f/u./Discuss ozempic & flexstar monitor /Requesting to check magnesium levels/Yes to shingles vax)   Subjective: Nancy [name] is a 63 y.o. female who presents for 2 mth f/u chronic ds management. 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    HM: Given rxn to get shingles from pharmacy.  She did not get it  CHF/Chronic hypoxia:  did get the portable O2 since last visit; uses 2L when she gets up to move around.Has her portable O2 with her today but does not have it on.  BKA BL: we submitted referral for home health aid on last visit.  Our CW gave her the # to call to set up an appointment for them to come out and do an evaluation..  Pt called and LVMM.  Never received call back.  She misplaced the number and is requesting that again today.  DM:  Lab Results  Component Value Date   HGBA1C 7.9 (A) 12/03/2023  Checks BS 8 p.m 2 hrs after dinner.  Range 199-255 On last visit, I recommended increase Humalog 70/30 from 3 units at bedtime to 2 units with BF and 3 units in evening. [facility] but told they need [place] to fill out a form. -Patient is wondering about getting on Ozempic.  States that her doctor at dialysis told her that she should be able to get on it.  Patient with history of gallstone pancreatitis 02/2020 for which she had cholecystectomy.  She has history of diabetic gastroparesis.  However she reports that she has not been bothered with the gastroparesis lately. -Overdue for  diabetic eye exam.  She is noted to have around ball-like lesion on the medial aspect of the right upper eyelid.  Patient states it has been there for about 2 years and has increased in size.  ESRD: Remains on dialysis.  Recently had fistula placed in the left arm.  She is wondering about having her magnesium level checked.    Patient Active Problem List   Diagnosis Date Noted   S/P bilateral BKA (below knee amputation) (HCC) 02/04/2024   History of CVA (cerebrovascular accident) 03/22/2023   Community acquired pneumonia 03/22/2023   Pain, unspecified 08/15/2022   Complication of vascular dialysis catheter 08/07/2022   Chest pain 05/30/2022   Fluid overload 05/30/2022   Iron deficiency anemia, unspecified 04/26/2022   Nausea 04/10/2022   Hyponatremia 04/04/2022   Prolonged QT interval 04/04/2022   Anaphylactic shock, unspecified, initial encounter 02/23/2022   Other allergy, initial encounter 02/23/2022   Pressure injury of skin 02/19/2022   Anemia in chronic kidney disease 02/16/2022   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) 02/16/2022   Hyperlipidemia, unspecified 02/16/2022   Hypertensive heart and chronic kidney disease with heart failure and with stage 5 chronic kidney disease, or end stage renal disease (HCC) 02/16/2022   Personal history of nicotine dependence 02/16/2022   Personal history of transient ischemic attack (TIA), and cerebral infarction without residual deficits 02/16/2022   Secondary hyperparathyroidism of renal origin (HCC) 02/16/2022  Renal anasarca 02/14/2022   ESRD on dialysis (HCC) 02/14/2022   Hyperkalemia 02/14/2022   S/P arteriovenous (AV) fistula creation 12/22/2021   Peripheral artery disease (HCC) 10/27/2021   Acute exacerbation of congestive heart failure (HCC) 10/24/2021   Acute cardiogenic pulmonary edema (HCC) 10/06/2021   CAD (coronary artery disease) 10/06/2021   Goals of care, counseling/discussion    Gangrene of left foot (HCC)    Cellulitis  of left foot    CHF (congestive heart failure) (HCC) 07/07/2021   Acute postoperative anemia due to expected blood loss superimposed on anemia of chronic renal insufficiency 06/30/2021   CKD (chronic kidney disease), stage IV (HCC) 06/11/2021   Chronic diastolic CHF (congestive heart failure) (HCC) 06/10/2021   Abnormal nuclear stress test    Dyspnea on exertion 03/07/2021   Orthopnea 02/25/2021   History of cerebrovascular accident (CVA) with residual deficit 05/06/2020   AKI (acute kidney injury) (HCC) 04/20/2020   Generalized weakness 04/20/2020   Dehydration 04/20/2020   Generalized abdominal pain    Pancreatitis, acute 02/29/2020   Cerebrovascular accident (CVA) (HCC) 11/08/2019   Stenosis of right carotid artery 11/08/2019   Cortical age-related cataract of both eyes 08/30/2019   Gait abnormality 08/30/2019   Statin declined 08/30/2019   Gastroesophageal reflux disease without esophagitis 04/18/2018   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   Insulin dependent type 2 diabetes mellitus (HCC) 10/29/2017   History of macular degeneration 10/29/2017   Chronic cervical pain 10/29/2016   Myalgia 09/14/2016   Insomnia 09/14/2016   Tinea pedis of both feet 08/20/2016   Macular degeneration 08/17/2016   Low back pain 09/21/2014   Hypertensive urgency 09/21/2014   HTN (hypertension) 09/08/2014   Thyroid nodule 08/17/2014   Morbid obesity (HCC) 08/17/2014   Hypokalemia 08/06/2014   Type II diabetes mellitus with neurological manifestations, uncontrolled 08/04/2014     Current Outpatient Medications on File Prior to Visit  Medication Sig Dispense Refill   Accu-Chek Softclix Lancets lancets Use to check blood sugar 3 times daily. 100 each 6   acetaminophen (TYLENOL) 500 MG tablet Take 1,000 mg by mouth every 8 (eight) hours as needed for mild pain (pain score 1-3), moderate pain (pain score 4-6) or headache.     albuterol (VENTOLIN HFA) 108 (90 Base) MCG/ACT inhaler Inhale 2 puffs into the lungs  every 6 (six) hours as needed. 18 g 3   ALPRAZolam (XANAX) 0.25 MG tablet Take 1 tablet (0.25 mg total) by mouth on Monday, Wednesday and Friday at Hemodialysis. 12 tablet 2   amLODipine (NORVASC) 10 MG tablet Take 1 tablet (10 mg total) by mouth daily for hypertension 90 tablet 1   aspirin 81 MG EC tablet Take 1 tablet (81 mg total) by mouth daily. 60 tablet 1   atorvastatin (LIPITOR) 40 MG tablet Take 1 tablet (40 mg total) by mouth daily for antilipids 90 tablet 1   Blood Glucose Monitoring Suppl (ACCU-CHEK GUIDE) w/Device KIT Use to check blood sugar 3 times daily. 1 kit 0   Continuous Glucose Receiver (FREESTYLE LIBRE 3 READER) DEVI Use to check blood glucose continuously. Dx code: E11.59, Z79.4 1 each 0   Continuous Glucose Sensor (FREESTYLE LIBRE 3 PLUS SENSOR) MISC Change sensor every 15 days. E11.59, Z79.4 2 each 11   ezetimibe (ZETIA) 10 MG tablet Take 1 tablet (10 mg total) by mouth daily for antilipids 90 tablet 1   glucose blood (ACCU-CHEK GUIDE) test strip Use to check blood sugar 3 times daily. 100 each 6   insulin glargine (  LANTUS SOLOSTAR) 100 UNIT/ML Solostar Pen Inject 3 Units into the skin 2 (two) times daily. 15 mL PRN   Insulin Pen Needle (PEN NEEDLES) 31G X 8 MM MISC Use for injection with insulin pen daily. 100 each 6   isosorbide mononitrate (IMDUR) 30 MG 24 hr tablet Take 2 tablets (60 mg total) by mouth daily for blood pressure 180 tablet 1   labetalol (NORMODYNE) 300 MG tablet Take 1 tablet (300 mg total) by mouth 2 (two) times daily for beta blockers 180 tablet 1   levothyroxine (SYNTHROID) 25 MCG tablet Take 1 tablet (25 mcg total) by mouth daily for low thyroid hormone 90 tablet 1   Multiple Vitamin (MULTI-VITAMIN DAILY) TABS Take 1 tablet by mouth at bedtime for supplementation (Patient not taking: Reported on 02/04/2024) 14 tablet 0   omeprazole (PRILOSEC) 20 MG capsule Take 2 capsules (40 mg total) by mouth daily for GERD 180 capsule 1   ondansetron (ZOFRAN) 8 MG  tablet Take 1 tablet (8 mg total) by mouth every 8 (eight) hours as needed for nausea 42 tablet 0   polyethylene glycol (MIRALAX / GLYCOLAX) 17 g packet Give 17 gram by mouth one time a day for constipation (Patient not taking: Reported on 02/04/2024) 14 packet 0   sevelamer carbonate (RENVELA) 800 MG tablet Take 3 tablets (2,400 mg total) by mouth 3 (three) times daily with meals and 1 tablet with snacks. 1080 tablet 3   Zinc 220 (50 Zn) MG CAPS Take 1 capsule by mouth daily for minerals, electrolytes (Patient not taking: Reported on 02/04/2024) 14 capsule 0   [DISCONTINUED] Insulin NPH Isophane & Regular (RELION 70/30 ) Inject 35 Units into the skin 2 (two) times daily.     No current facility-administered medications on file prior to visit.    Allergies  Allergen Reactions   Hydralazine Other (See Comments)   Hydralazine Hcl Other (See Comments)    Hair loss   Hydrocodone Itching and Other (See Comments)    Upset stomach   Metformin And Related Nausea And Vomiting and Other (See Comments)    Stomach pains, also   Other Nausea Only and Other (See Comments)    Lettuce- Does not digest this!!   Plaquenil [Hydroxychloroquine Sulfate] Hives   Shellfish Allergy Nausea And Vomiting   Shellfish-Derived Products Nausea Only and Other (See Comments)    Caused an upset stomach   Shrimp (Diagnostic) Nausea Only and Other (See Comments)    Upset stomach    Sulfa Antibiotics Hives   Sulfur Hives    Social History   Socioeconomic History   Marital status: Legally Separated    Spouse name: [name]   Number of children: 1   Years of education: some colle   Highest education level: Not on file  Occupational History   Occupation: Employed FT as Warden/ranger    Comment: [employer] , NA  Tobacco Use   Smoking status: Former    Current packs/day: 0.25    Average packs/day: 0.3 packs/day for 0.5 years (0.1 ttl pk-yrs)    Types: Cigarettes    Passive exposure: Never   Smokeless tobacco: Never    Tobacco comments:    Former smoker 11/03/21  Vaping Use   Vaping status: Never Used  Substance and Sexual Activity   Alcohol use: No   Drug use: No   Sexual activity: Not Currently    Birth control/protection: None  Other Topics Concern   Not on file  Social History Narrative  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    Social Drivers of Corporate investment banker Strain: Low Risk  (02/04/2024)   Overall Financial Resource Strain (CARDIA)    Difficulty of Paying Living Expenses: Not hard at all  Food Insecurity: No Food Insecurity (02/04/2024)   Hunger Vital Sign    Worried About Running Out of Food in the Last Year: Never true    Ran Out of Food in the Last Year: Never true  Transportation Needs: No Transportation Needs (02/04/2024)   PRAPARE - Administrator, Civil Service (Medical): No    Lack of Transportation (Non-Medical): No  Physical Activity: Inactive (02/04/2024)   Exercise Vital Sign    Days of Exercise per Week: 0 days    Minutes of Exercise per Session: 0 min  Stress: No Stress Concern Present (02/04/2024)   [employer] of Occupational Health - Occupational Stress Questionnaire    Feeling of Stress : Not at all  Social Connections: Moderately Integrated (02/04/2024)   Social Connection and Isolation Panel [NHANES]    Frequency of Communication with Friends and Family: Twice a week    Frequency of Social Gatherings with Friends and Family: Once a week    Attends Religious Services: Never    Database administrator or Organizations: Yes    Attends Banker Meetings: Never    Marital Status: Married  Catering manager Violence: Not At Risk (02/04/2024)   Humiliation, Afraid, Rape, and Kick questionnaire    Fear of Current or Ex-Partner: No    Emotionally Abused: No    Physically Abused: No    Sexually Abused: No    Family History  Problem Relation Age of Onset   Hypertension Mother    Heart  disease Mother    Diabetes Mother    Thyroid disease Mother    Congestive Heart Failure Mother    Breast cancer Maternal Grandmother    Colon cancer Maternal Grandfather    Heart attack Sister    Heart disease Brother    Hyperlipidemia Brother    Hypertension Brother    Diabetes Father    Breast cancer Maternal Aunt     Past Surgical History:  Procedure Laterality Date   A/V FISTULAGRAM Right 09/25/2022   Procedure: A/V Fistulagram;  Surgeon: Nada Libman, MD;  Location: [facility];  Service: Cardiovascular;  Laterality: Right;   ABDOMINAL HYSTERECTOMY  2005   AMPUTATION Bilateral 04/04/2022   Procedure: BILATERAL BELOW KNEE AMPUTATION;  Surgeon: Nadara Mustard, MD;  Location: [facility] OR;  Service: Orthopedics;  Laterality: Bilateral;   AV FISTULA PLACEMENT Right 12/22/2021   Procedure: RIGHT ARM ARTERIOVENOUS (AV) BRACIOCPHELAIC FISTULA CREATION;  Surgeon: Maeola Harman, MD;  Location: [facility] OR;  Service: Vascular;  Laterality: Right;   AV FISTULA PLACEMENT Right 02/19/2022   Procedure: RIGHT ARTERIOVENOUS FISTULA;  Surgeon: Chuck Hint, MD;  Location: [facility] OR;  Service: Vascular;  Laterality: Right;   AV FISTULA PLACEMENT Left 12/24/2023   Procedure: LEFT BRACHIOCEPHALIC ARTERIOVENOUS (AV) FISTULA CREATION;  Surgeon: Victorino Sparrow, MD;  Location: [facility] OR;  Service: Vascular;  Laterality: Left;   BASCILIC VEIN TRANSPOSITION Right 08/07/2022   Procedure: SECOND STAGE RIGHT BASILIC VEIN TRANSPOSITION;  Surgeon: Chuck Hint, MD;  Location: [facility] OR;  Service: Vascular;  Laterality: Right;   CATARACT EXTRACTION Left 11/2019   CESAREAN SECTION  1983    CHOLECYSTECTOMY N/A 03/05/2020   Procedure: LAPAROSCOPIC CHOLECYSTECTOMY WITH  INTRAOPERATIVE CHOLANGIOGRAM;  Surgeon: Manus Rudd, MD;  Location: [facility] ORS;  Service: General;  Laterality: N/A;   ESOPHAGOGASTRODUODENOSCOPY (EGD) WITH PROPOFOL Left 08/26/2014   Procedure: ESOPHAGOGASTRODUODENOSCOPY (EGD) WITH  PROPOFOL;  Surgeon: Willis Modena, MD;  Location: [facility] ENDOSCOPY;  Service: Endoscopy;  Laterality: Left;   ESOPHAGOGASTRODUODENOSCOPY (EGD) WITH PROPOFOL N/A 03/03/2020   Procedure: ESOPHAGOGASTRODUODENOSCOPY (EGD) WITH PROPOFOL;  Surgeon: Beverley Fiedler, MD;  Location: [facility];  Service: Gastroenterology;  Laterality: N/A;   IR FLUORO GUIDE CV LINE RIGHT  02/15/2022   IR US GUIDE VASC ACCESS RIGHT  02/15/2022   PERIPHERAL VASCULAR BALLOON ANGIOPLASTY Right 09/25/2022   Procedure: PERIPHERAL VASCULAR BALLOON ANGIOPLASTY;  Surgeon: Nada Libman, MD;  Location: [facility];  Service: Cardiovascular;  Laterality: Right;  AVF   RIGHT HEART CATH N/A 07/14/2021   Procedure: RIGHT HEART CATH;  Surgeon: Laurey Morale, MD;  Location: [facility] INVASIVE CV LAB;  Service: Cardiovascular;  Laterality: N/A;   RIGHT/LEFT HEART CATH AND CORONARY ANGIOGRAPHY N/A 04/27/2021   Procedure: RIGHT/LEFT HEART CATH AND CORONARY ANGIOGRAPHY;  Surgeon: Runell Gess, MD;  Location: [facility];  Service: Cardiovascular;  Laterality: N/A;    ROS: Review of Systems Negative except as stated above  PHYSICAL EXAM: BP 130/73 (BP Location: Left Arm, Patient Position: Sitting, Cuff Size: Large)   Pulse 76   Temp 97.9 F (36.6 C) (Oral)   Ht 5\' 3"  (1.6 m)   SpO2 97%   BMI 40.57 kg/m   Wt Readings from Last 3 Encounters:  02/04/24 237 lb 3.2 oz (107.6 kg)  01/28/24 229 lb (103.9 kg)  01/21/24 229 lb (103.9 kg)    Physical Exam  General appearance -older obese African-American female sitting in wheelchair in NAD. Mental status -answers most questions appropriately but is sometimes forgetful. Chest - clear to auscultation, no wheezes, rales or rhonchi, symmetric air entry Heart - normal rate, regular rhythm, normal S1, S2, no murmurs, rubs, clicks or gallops Extremities -she has bilateral below-knee amputation.      Latest Ref Rng & Units 12/24/2023    6:07 AM 07/24/2023   12:24 PM 03/24/2023     3:28 AM  CMP  Glucose 70 - 99 mg/dL 409  811  914   BUN 8 - 23 mg/dL 19  27  33   Creatinine 0.44 - 1.00 mg/dL 7.82  9.56  2.13   Sodium 135 - 145 mmol/L 136  133  135   Potassium 3.5 - 5.1 mmol/L 4.6  4.2  4.8   Chloride 98 - 111 mmol/L 95  91  99   CO2 22 - 32 mmol/L  23  26   Calcium 8.9 - 10.3 mg/dL  7.9  8.2   Total Protein 6.5 - 8.1 g/dL  7.4  7.2   Total Bilirubin 0.3 - 1.2 mg/dL  0.8  0.8   Alkaline Phos 38 - 126 U/L  81  97   AST 15 - 41 U/L  12  19   ALT 0 - 44 U/L  13  14    Lipid Panel     Component Value Date/Time   CHOL 110 10/08/2023 1145   CHOL 193 02/11/2020 1103   TRIG 95 10/08/2023 1145   HDL 45 10/08/2023 1145   HDL 40 02/11/2020 1103   CHOLHDL 2.4 10/08/2023 1145   VLDL 19 10/08/2023 1145   LDLCALC 46 10/08/2023 1145   LDLCALC 122 (H) 02/11/2020 1103    CBC  Component Value Date/Time   WBC 11.4 (H) 07/24/2023 1224   RBC 3.91 07/24/2023 1224   HGB 11.6 (L) 12/24/2023 0607   HGB 8.8 (L) 06/30/2021 1226   HGB 12.5 07/08/2008 1551   HCT 34.0 (L) 12/24/2023 0607   HCT 28.3 (L) 06/30/2021 1226   HCT 37.3 07/08/2008 1551   PLT 223 07/24/2023 1224   PLT 304 06/30/2021 1226   MCV 93.6 07/24/2023 1224   MCV 89 06/30/2021 1226   MCV 84.7 07/08/2008 1551   MCH 28.9 07/24/2023 1224   MCHC 30.9 07/24/2023 1224   RDW 15.0 07/24/2023 1224   RDW 13.6 06/30/2021 1226   RDW 13.0 07/08/2008 1551   LYMPHSABS 1.5 07/24/2023 1224   LYMPHSABS 2.5 02/23/2021 1523   LYMPHSABS 4.6 (H) 07/08/2008 1551   MONOABS 1.2 (H) 07/24/2023 1224   MONOABS 0.6 07/08/2008 1551   EOSABS 0.4 07/24/2023 1224   EOSABS 0.5 (H) 02/23/2021 1523   BASOSABS 0.1 07/24/2023 1224   BASOSABS 0.1 02/23/2021 1523   BASOSABS 0.1 07/08/2008 1551    ASSESSMENT AND PLAN: 1. Type 2 diabetes mellitus with other circulatory complication, with long-term current use of insulin (HCC) (Primary) Advised patient to increase Humalog 70/30 insulin to 2 units in the morning and 3 units in the evening  as was discussed on last visit.  Written instructions also given. [facility]re continuous glucose monitor.  Patient states that some of the form needs to be completed.  Will send message to our clinical pharmacist to see if he can problem solve this. - Ambulatory referral to Ophthalmology - Ambulatory referral to Endocrinology  2. Morbid obesity (HCC) Encourage healthy eating habits.  3. S/P bilateral BKA (below knee amputation) (HCC) Pt given phone # to [facility] so that they can set her up with a HH agency.  4. Chronic diastolic CHF (congestive heart failure) (HCC) Stable.  Continue isosorbide 60 mg daily and labetalol 300 mg twice a day  5. Chronic respiratory failure with hypoxia, on home O2 therapy (HCC) Patient was able to get portable O2  6. Lesion of right eyelid - Ambulatory referral to Ophthalmology  7. Need for shingles vaccine Printed rxn given to pt to take to pharmacy - Zoster Vaccine Adjuvanted [facility]) injection; Inject 0.5 mLs into the muscle once for 1 dose. (Patient not taking: Reported on 02/04/2024)  Dispense: 0.5 mL; Refill: 0     Patient was given the opportunity to ask questions.  Patient verbalized understanding of the plan and was able to repeat key elements of the plan.   This documentation was completed using Paediatric nurse.  Any transcriptional errors are unintentional.  Orders Placed This Encounter  Procedures   Ambulatory referral to Ophthalmology   Ambulatory referral to Endocrinology     Requested Prescriptions   Signed Prescriptions Disp Refills   Zoster Vaccine Adjuvanted [facility]) injection 0.5 mL 0    Sig: Inject 0.5 mLs into the muscle once for 1 dose.    Patient not taking: Reported on 02/04/2024    Return in about 4 months (around 06/03/2024).  Jonah Blue, MD, FACP

## 2024-02-04 NOTE — Patient Instructions (Addendum)
[place] [facility]  Ph #: [phone]  Please change your insulin to taking 2 units in the morning and 3 units in the evening as discussed today.  I have referred you to the ophthalmologist for your eye exam and also to have the lesion on the right upper eyelid evaluated.

## 2024-02-06 ENCOUNTER — Ambulatory Visit (HOSPITAL_COMMUNITY): Payer: Medicare Other

## 2024-02-11 ENCOUNTER — Other Ambulatory Visit (HOSPITAL_COMMUNITY): Payer: Self-pay | Admitting: Internal Medicine

## 2024-02-11 ENCOUNTER — Other Ambulatory Visit (HOSPITAL_COMMUNITY): Payer: Self-pay | Admitting: Emergency Medicine

## 2024-02-11 ENCOUNTER — Telehealth: Payer: Self-pay

## 2024-02-11 ENCOUNTER — Other Ambulatory Visit: Payer: Self-pay

## 2024-02-11 DIAGNOSIS — K219 Gastro-esophageal reflux disease without esophagitis: Secondary | ICD-10-CM

## 2024-02-11 DIAGNOSIS — I639 Cerebral infarction, unspecified: Secondary | ICD-10-CM

## 2024-02-11 DIAGNOSIS — I10 Essential (primary) hypertension: Secondary | ICD-10-CM

## 2024-02-11 DIAGNOSIS — E038 Other specified hypothyroidism: Secondary | ICD-10-CM

## 2024-02-11 NOTE — Telephone Encounter (Signed)
 Spoke with patient verified that the number she received fron NT was the correct number for [email or web address]

## 2024-02-11 NOTE — Progress Notes (Signed)
 Paramedicine Encounter    Patient ID: Nancy [name], female    DOB: Apr 24, 1961, 63 y.o.   MRN: [id number]   Complaints NONE  Assessment A&O x 4, skin W&D w/ good color.  Denies chest pain or SOB. Lung sounds clear bilat.  No peripheral edema noted.    Compliance with meds YES  Pill box filled x 1 week  Refills needed NONE  Meds changes since last visit NONE    Social changes NONE   BP (!) 152/80 (BP Location: Right Arm, Patient Position: Sitting, Cuff Size: Normal)   Pulse 80   Resp 16   Wt 230 lb 12.8 oz (104.7 kg)   SpO2 99%   BMI 40.88 kg/m  Weight yesterday- not taken Last visit weight-237lb  Today's visit finds Nancy [name] reporting to be feeling well.  Nancy [name] has done well with med compliance.  Denies chest pain or SOB.  Lung sounds clear bilat.  Pill box reconciled x 1 week.   Today reached out to Nancy [name] at Dr. Henriette Combs office regarding the Prior Authorization for pt's Freestyle sensors. So far no definitive answer.  I will reach out to [facility] to find out if they have received paperwork from Dr. Henriette Combs office and clarify when the sensors might be ready.  Next home visit 02/18/24 @ 1:30  ACTION: Home visit completed  Bethanie Dicker [phone] 02/11/24  Patient Care Team: Marcine Matar, MD as PCP - General (Internal Medicine) Runell Gess, MD as PCP - Cardiology (Cardiology) Laurey Morale, MD as PCP - Advanced Heart Failure (Cardiology) Center, [facility]  Patient Active Problem List   Diagnosis Date Noted   S/P bilateral BKA (below knee amputation) (HCC) 02/04/2024   History of CVA (cerebrovascular accident) 03/22/2023   Community acquired pneumonia 03/22/2023   Pain, unspecified 08/15/2022   Complication of vascular dialysis catheter 08/07/2022   Chest pain 05/30/2022   Fluid overload 05/30/2022   Iron deficiency anemia, unspecified 04/26/2022   Nausea 04/10/2022   Hyponatremia 04/04/2022   Prolonged QT  interval 04/04/2022   Anaphylactic shock, unspecified, initial encounter 02/23/2022   Other allergy, initial encounter 02/23/2022   Pressure injury of skin 02/19/2022   Anemia in chronic kidney disease 02/16/2022   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) 02/16/2022   Hyperlipidemia, unspecified 02/16/2022   Hypertensive heart and chronic kidney disease with heart failure and with stage 5 chronic kidney disease, or end stage renal disease (HCC) 02/16/2022   Personal history of nicotine dependence 02/16/2022   Personal history of transient ischemic attack (TIA), and cerebral infarction without residual deficits 02/16/2022   Secondary hyperparathyroidism of renal origin (HCC) 02/16/2022   Renal anasarca 02/14/2022   ESRD on dialysis (HCC) 02/14/2022   Hyperkalemia 02/14/2022   S/P arteriovenous (AV) fistula creation 12/22/2021   Peripheral artery disease (HCC) 10/27/2021   Acute exacerbation of congestive heart failure (HCC) 10/24/2021   Acute cardiogenic pulmonary edema (HCC) 10/06/2021   CAD (coronary artery disease) 10/06/2021   Goals of care, counseling/discussion    Gangrene of left foot (HCC)    Cellulitis of left foot    CHF (congestive heart failure) (HCC) 07/07/2021   Acute postoperative anemia due to expected blood loss superimposed on anemia of chronic renal insufficiency 06/30/2021   CKD (chronic kidney disease), stage IV (HCC) 06/11/2021   Chronic diastolic CHF (congestive heart failure) (HCC) 06/10/2021   Abnormal nuclear stress test    Dyspnea on exertion 03/07/2021   Orthopnea 02/25/2021   History of  cerebrovascular accident (CVA) with residual deficit 05/06/2020   AKI (acute kidney injury) (HCC) 04/20/2020   Generalized weakness 04/20/2020   Dehydration 04/20/2020   Generalized abdominal pain    Pancreatitis, acute 02/29/2020   Cerebrovascular accident (CVA) (HCC) 11/08/2019   Stenosis of right carotid artery 11/08/2019   Cortical age-related cataract of both eyes  08/30/2019   Gait abnormality 08/30/2019   Statin declined 08/30/2019   Gastroesophageal reflux disease without esophagitis 04/18/2018   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   Insulin dependent type 2 diabetes mellitus (HCC) 10/29/2017   History of macular degeneration 10/29/2017   Chronic cervical pain 10/29/2016   Myalgia 09/14/2016   Insomnia 09/14/2016   Tinea pedis of both feet 08/20/2016   Macular degeneration 08/17/2016   Low back pain 09/21/2014   Hypertensive urgency 09/21/2014   HTN (hypertension) 09/08/2014   Thyroid nodule 08/17/2014   Morbid obesity (HCC) 08/17/2014   Hypokalemia 08/06/2014   Type II diabetes mellitus with neurological manifestations, uncontrolled 08/04/2014    Current Outpatient Medications:    Accu-Chek Softclix Lancets lancets, Use to check blood sugar 3 times daily., Disp: 100 each, Rfl: 6   acetaminophen (TYLENOL) 500 MG tablet, Take 1,000 mg by mouth every 8 (eight) hours as needed for mild pain (pain score 1-3), moderate pain (pain score 4-6) or headache., Disp: , Rfl:    albuterol (VENTOLIN HFA) 108 (90 Base) MCG/ACT inhaler, Inhale 2 puffs into the lungs every 6 (six) hours as needed., Disp: 18 g, Rfl: 3   ALPRAZolam (XANAX) 0.25 MG tablet, Take 1 tablet (0.25 mg total) by mouth on Monday, Wednesday and Friday at Hemodialysis., Disp: 12 tablet, Rfl: 2   amLODipine (NORVASC) 10 MG tablet, Take 1 tablet (10 mg total) by mouth daily for hypertension, Disp: 90 tablet, Rfl: 1   aspirin 81 MG EC tablet, Take 1 tablet (81 mg total) by mouth daily., Disp: 60 tablet, Rfl: 1   atorvastatin (LIPITOR) 40 MG tablet, Take 1 tablet (40 mg total) by mouth daily for antilipids, Disp: 90 tablet, Rfl: 1   Blood Glucose Monitoring Suppl (ACCU-CHEK GUIDE) w/Device KIT, Use to check blood sugar 3 times daily., Disp: 1 kit, Rfl: 0   ezetimibe (ZETIA) 10 MG tablet, Take 1 tablet (10 mg total) by mouth daily for antilipids, Disp: 90 tablet, Rfl: 1   glucose blood (ACCU-CHEK GUIDE)  test strip, Use to check blood sugar 3 times daily., Disp: 100 each, Rfl: 6   insulin glargine (LANTUS SOLOSTAR) 100 UNIT/ML Solostar Pen, Inject 3 Units into the skin 2 (two) times daily., Disp: 15 mL, Rfl: PRN   Insulin Pen Needle (PEN NEEDLES) 31G X 8 MM MISC, Use for injection with insulin pen daily., Disp: 100 each, Rfl: 6   isosorbide mononitrate (IMDUR) 30 MG 24 hr tablet, Take 2 tablets (60 mg total) by mouth daily for blood pressure, Disp: 180 tablet, Rfl: 1   labetalol (NORMODYNE) 300 MG tablet, Take 1 tablet (300 mg total) by mouth 2 (two) times daily for beta blockers, Disp: 180 tablet, Rfl: 1   levothyroxine (SYNTHROID) 25 MCG tablet, Take 1 tablet (25 mcg total) by mouth daily for low thyroid hormone, Disp: 90 tablet, Rfl: 1   omeprazole (PRILOSEC) 20 MG capsule, Take 2 capsules (40 mg total) by mouth daily for GERD, Disp: 180 capsule, Rfl: 1   ondansetron (ZOFRAN) 8 MG tablet, Take 1 tablet (8 mg total) by mouth every 8 (eight) hours as needed for nausea, Disp: 42 tablet, Rfl: 0  sevelamer carbonate (RENVELA) 800 MG tablet, Take 3 tablets (2,400 mg total) by mouth 3 (three) times daily with meals and 1 tablet with snacks., Disp: 1080 tablet, Rfl: 3   Continuous Glucose Receiver (FREESTYLE LIBRE 3 READER) DEVI, Use to check blood glucose continuously. Dx code: E11.59, Z79.4 (Patient not taking: Reported on 02/11/2024), Disp: 1 each, Rfl: 0   Continuous Glucose Sensor (FREESTYLE LIBRE 3 PLUS SENSOR) MISC, Change sensor every 15 days. E11.59, Z79.4 (Patient not taking: Reported on 02/11/2024), Disp: 2 each, Rfl: 11   Multiple Vitamin (MULTI-VITAMIN DAILY) TABS, Take 1 tablet by mouth at bedtime for supplementation (Patient not taking: Reported on 02/04/2024), Disp: 14 tablet, Rfl: 0   polyethylene glycol (MIRALAX / GLYCOLAX) 17 g packet, Give 17 gram by mouth one time a day for constipation (Patient not taking: Reported on 02/04/2024), Disp: 14 packet, Rfl: 0   Zinc 220 (50 Zn) MG CAPS, Take 1  capsule by mouth daily for minerals, electrolytes (Patient not taking: Reported on 02/04/2024), Disp: 14 capsule, Rfl: 0 Allergies  Allergen Reactions   Hydralazine Other (See Comments)   Hydralazine Hcl Other (See Comments)    Hair loss   Hydrocodone Itching and Other (See Comments)    Upset stomach   Metformin And Related Nausea And Vomiting and Other (See Comments)    Stomach pains, also   Other Nausea Only and Other (See Comments)    Lettuce- Does not digest this!!   Plaquenil [Hydroxychloroquine Sulfate] Hives   Shellfish Allergy Nausea And Vomiting   Shellfish-Derived Products Nausea Only and Other (See Comments)    Caused an upset stomach   Shrimp (Diagnostic) Nausea Only and Other (See Comments)    Upset stomach    Sulfa Antibiotics Hives   Sulfur Hives     Social History   Socioeconomic History   Marital status: Legally Separated    Spouse name: [name]   Number of children: 1   Years of education: some colle   Highest education level: Not on file  Occupational History   Occupation: Employed FT as Warden/ranger    Comment: [employer] , NA  Tobacco Use   Smoking status: Former    Current packs/day: 0.25    Average packs/day: 0.3 packs/day for 0.5 years (0.1 ttl pk-yrs)    Types: Cigarettes    Passive exposure: Never   Smokeless tobacco: Never   Tobacco comments:    Former smoker 11/03/21  Vaping Use   Vaping status: Never Used  Substance and Sexual Activity   Alcohol use: No   Drug use: No   Sexual activity: Not Currently    Birth control/protection: None  Other Topics Concern   Not on file  Social History Narrative   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    Social Drivers of Health   Financial Resource Strain: Low Risk  (02/04/2024)   Overall Financial Resource Strain (CARDIA)    Difficulty of Paying Living Expenses: Not hard at all  Food Insecurity: No Food Insecurity (02/04/2024)   Hunger Vital Sign    Worried About  Running Out of Food in the Last Year: Never true    Ran Out of Food in the Last Year: Never true  Transportation Needs: No Transportation Needs (02/04/2024)   PRAPARE - Administrator, Civil Service (Medical): No    Lack of Transportation (Non-Medical): No  Physical Activity: Inactive (02/04/2024)   Exercise Vital Sign    Days of Exercise per  Week: 0 days    Minutes of Exercise per Session: 0 min  Stress: No Stress Concern Present (02/04/2024)   [employer] of Occupational Health - Occupational Stress Questionnaire    Feeling of Stress : Not at all  Social Connections: Moderately Integrated (02/04/2024)   Social Connection and Isolation Panel [NHANES]    Frequency of Communication with Friends and Family: Twice a week    Frequency of Social Gatherings with Friends and Family: Once a week    Attends Religious Services: Never    Database administrator or Organizations: Yes    Attends Banker Meetings: Never    Marital Status: Married  Catering manager Violence: Not At Risk (02/04/2024)   Humiliation, Afraid, Rape, and Kick questionnaire    Fear of Current or Ex-Partner: No    Emotionally Abused: No    Physically Abused: No    Sexually Abused: No    Physical Exam      Future Appointments  Date Time Provider Department Center  06/04/2024 11:10 AM Marcine Matar, MD [facility] None

## 2024-02-11 NOTE — Telephone Encounter (Signed)
 Copied from CRM [id number]. Topic: General - Other >> Feb 11, 2024  9:52 AM Prudencio Pair wrote: Reason for CRM: Patient called stating that Dr. Laural Benes provided her with a number to call to set up a home evaluation. Patient stated she misplaced the number but she thinks it was listed on her after visit summary. Provided her with the number that is listed in the summary for [facility]. Please give patient a call to be sure this is the correct number that she is needing. She could not remember the name of the place. CB #: [id number].

## 2024-02-12 ENCOUNTER — Other Ambulatory Visit: Payer: Self-pay

## 2024-02-12 ENCOUNTER — Other Ambulatory Visit (HOSPITAL_COMMUNITY): Payer: Self-pay

## 2024-02-12 MED ORDER — LEVOTHYROXINE SODIUM 25 MCG PO TABS
25.0000 ug | ORAL_TABLET | Freq: Every day | ORAL | 1 refills | Status: DC
  Filled 2024-02-12: qty 90, 90d supply, fill #0
  Filled 2024-05-11: qty 90, 90d supply, fill #1

## 2024-02-12 MED ORDER — LABETALOL HCL 300 MG PO TABS
300.0000 mg | ORAL_TABLET | Freq: Two times a day (BID) | ORAL | 1 refills | Status: DC
  Filled 2024-02-12: qty 180, 90d supply, fill #0
  Filled 2024-05-11: qty 180, 90d supply, fill #1

## 2024-02-12 MED ORDER — EZETIMIBE 10 MG PO TABS
10.0000 mg | ORAL_TABLET | Freq: Every day | ORAL | 1 refills | Status: DC
  Filled 2024-02-12: qty 90, 90d supply, fill #0
  Filled 2024-05-11: qty 90, 90d supply, fill #1

## 2024-02-12 MED ORDER — OMEPRAZOLE 20 MG PO CPDR
40.0000 mg | DELAYED_RELEASE_CAPSULE | Freq: Every day | ORAL | 1 refills | Status: DC
  Filled 2024-02-12: qty 180, 90d supply, fill #0
  Filled 2024-05-11: qty 180, 90d supply, fill #1

## 2024-02-12 MED ORDER — ISOSORBIDE MONONITRATE ER 30 MG PO TB24
60.0000 mg | ORAL_TABLET | Freq: Every day | ORAL | 1 refills | Status: DC
  Filled 2024-02-12: qty 180, 90d supply, fill #0
  Filled 2024-05-11: qty 180, 90d supply, fill #1

## 2024-02-12 MED ORDER — ATORVASTATIN CALCIUM 40 MG PO TABS
40.0000 mg | ORAL_TABLET | Freq: Every day | ORAL | 1 refills | Status: DC
  Filled 2024-02-12: qty 90, 90d supply, fill #0
  Filled 2024-04-21 – 2024-05-06 (×2): qty 90, 90d supply, fill #1

## 2024-02-17 ENCOUNTER — Other Ambulatory Visit: Payer: Self-pay

## 2024-02-17 ENCOUNTER — Other Ambulatory Visit (HOSPITAL_COMMUNITY): Payer: Self-pay | Admitting: Internal Medicine

## 2024-02-17 ENCOUNTER — Other Ambulatory Visit (HOSPITAL_COMMUNITY): Payer: Self-pay | Admitting: Emergency Medicine

## 2024-02-17 DIAGNOSIS — I10 Essential (primary) hypertension: Secondary | ICD-10-CM

## 2024-02-17 MED ORDER — AMLODIPINE BESYLATE 10 MG PO TABS
10.0000 mg | ORAL_TABLET | Freq: Every day | ORAL | 1 refills | Status: DC
Start: 2024-02-17 — End: 2024-06-04
  Filled 2024-03-17: qty 30, 30d supply, fill #0
  Filled 2024-03-17 (×2): qty 90, 90d supply, fill #0
  Filled 2024-04-06 – 2024-04-21 (×2): qty 30, 30d supply, fill #1

## 2024-02-17 NOTE — Progress Notes (Unsigned)
 Paramedicine Encounter    Patient ID: Nancy [name], female    DOB: 1961-10-31, 63 y.o.   MRN: [id number]   Complaints NONE  Assessment***  Compliance with meds YES  Pill box filled x 1 week  Refills needed Omeprazole, Zetia, Amlodopine, Isosorbide, Atorvastatin  Meds changes since last visit NONE   Social changes NONE   There were no vitals taken for this visit. Weight yesterday-*** Last visit weight-*** CBG 135  Message sent to Dr. Henriette Combs office for refill on amlodopine. ACTION: {Paramed Action:[phone]}  Beatrix Shipper, EMT-Paramedic [phone] 02/17/24  Patient Care Team: Marcine Matar, MD as PCP - General (Internal Medicine) Runell Gess, MD as PCP - Cardiology (Cardiology) Laurey Morale, MD as PCP - Advanced Heart Failure (Cardiology) Center, [facility]  Patient Active Problem List   Diagnosis Date Noted   S/P bilateral BKA (below knee amputation) (HCC) 02/04/2024   History of CVA (cerebrovascular accident) 03/22/2023   Community acquired pneumonia 03/22/2023   Pain, unspecified 08/15/2022   Complication of vascular dialysis catheter 08/07/2022   Chest pain 05/30/2022   Fluid overload 05/30/2022   Iron deficiency anemia, unspecified 04/26/2022   Nausea 04/10/2022   Hyponatremia 04/04/2022   Prolonged QT interval 04/04/2022   Anaphylactic shock, unspecified, initial encounter 02/23/2022   Other allergy, initial encounter 02/23/2022   Pressure injury of skin 02/19/2022   Anemia in chronic kidney disease 02/16/2022   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) 02/16/2022   Hyperlipidemia, unspecified 02/16/2022   Hypertensive heart and chronic kidney disease with heart failure and with stage 5 chronic kidney disease, or end stage renal disease (HCC) 02/16/2022   Personal history of nicotine dependence 02/16/2022   Personal history of transient ischemic attack (TIA), and cerebral infarction without residual deficits 02/16/2022    Secondary hyperparathyroidism of renal origin (HCC) 02/16/2022   Renal anasarca 02/14/2022   ESRD on dialysis (HCC) 02/14/2022   Hyperkalemia 02/14/2022   S/P arteriovenous (AV) fistula creation 12/22/2021   Peripheral artery disease (HCC) 10/27/2021   Acute exacerbation of congestive heart failure (HCC) 10/24/2021   Acute cardiogenic pulmonary edema (HCC) 10/06/2021   CAD (coronary artery disease) 10/06/2021   Goals of care, counseling/discussion    Gangrene of left foot (HCC)    Cellulitis of left foot    CHF (congestive heart failure) (HCC) 07/07/2021   Acute postoperative anemia due to expected blood loss superimposed on anemia of chronic renal insufficiency 06/30/2021   CKD (chronic kidney disease), stage IV (HCC) 06/11/2021   Chronic diastolic CHF (congestive heart failure) (HCC) 06/10/2021   Abnormal nuclear stress test    Dyspnea on exertion 03/07/2021   Orthopnea 02/25/2021   History of cerebrovascular accident (CVA) with residual deficit 05/06/2020   AKI (acute kidney injury) (HCC) 04/20/2020   Generalized weakness 04/20/2020   Dehydration 04/20/2020   Generalized abdominal pain    Pancreatitis, acute 02/29/2020   Cerebrovascular accident (CVA) (HCC) 11/08/2019   Stenosis of right carotid artery 11/08/2019   Cortical age-related cataract of both eyes 08/30/2019   Gait abnormality 08/30/2019   Statin declined 08/30/2019   Gastroesophageal reflux disease without esophagitis 04/18/2018   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   Insulin dependent type 2 diabetes mellitus (HCC) 10/29/2017   History of macular degeneration 10/29/2017   Chronic cervical pain 10/29/2016   Myalgia 09/14/2016   Insomnia 09/14/2016   Tinea pedis of both feet 08/20/2016   Macular degeneration 08/17/2016   Low back pain 09/21/2014   Hypertensive urgency 09/21/2014   HTN (hypertension)  09/08/2014   Thyroid nodule 08/17/2014   Morbid obesity (HCC) 08/17/2014   Hypokalemia 08/06/2014   Type II diabetes  mellitus with neurological manifestations, uncontrolled 08/04/2014    Current Outpatient Medications:    Accu-Chek Softclix Lancets lancets, Use to check blood sugar 3 times daily., Disp: 100 each, Rfl: 6   acetaminophen (TYLENOL) 500 MG tablet, Take 1,000 mg by mouth every 8 (eight) hours as needed for mild pain (pain score 1-3), moderate pain (pain score 4-6) or headache., Disp: , Rfl:    albuterol (VENTOLIN HFA) 108 (90 Base) MCG/ACT inhaler, Inhale 2 puffs into the lungs every 6 (six) hours as needed., Disp: 18 g, Rfl: 3   ALPRAZolam (XANAX) 0.25 MG tablet, Take 1 tablet (0.25 mg total) by mouth on Monday, Wednesday and Friday at Hemodialysis., Disp: 12 tablet, Rfl: 2   amLODipine (NORVASC) 10 MG tablet, Take 1 tablet (10 mg total) by mouth daily for hypertension, Disp: 90 tablet, Rfl: 1   aspirin 81 MG EC tablet, Take 1 tablet (81 mg total) by mouth daily., Disp: 60 tablet, Rfl: 1   atorvastatin (LIPITOR) 40 MG tablet, Take 1 tablet (40 mg total) by mouth daily for cholesterol., Disp: 90 tablet, Rfl: 1   Blood Glucose Monitoring Suppl (ACCU-CHEK GUIDE) w/Device KIT, Use to check blood sugar 3 times daily., Disp: 1 kit, Rfl: 0   ezetimibe (ZETIA) 10 MG tablet, Take 1 tablet (10 mg total) by mouth daily for cholesterol., Disp: 90 tablet, Rfl: 1   glucose blood (ACCU-CHEK GUIDE) test strip, Use to check blood sugar 3 times daily., Disp: 100 each, Rfl: 6   insulin glargine (LANTUS SOLOSTAR) 100 UNIT/ML Solostar Pen, Inject 3 Units into the skin 2 (two) times daily., Disp: 15 mL, Rfl: PRN   Insulin Pen Needle (PEN NEEDLES) 31G X 8 MM MISC, Use for injection with insulin pen daily., Disp: 100 each, Rfl: 6   isosorbide mononitrate (IMDUR) 30 MG 24 hr tablet, Take 2 tablets (60 mg total) by mouth daily for blood pressure., Disp: 180 tablet, Rfl: 1   labetalol (NORMODYNE) 300 MG tablet, Take 1 tablet (300 mg total) by mouth 2 (two) times daily., Disp: 180 tablet, Rfl: 1   levothyroxine (SYNTHROID) 25  MCG tablet, Take 1 tablet (25 mcg total) by mouth daily for low thyroid hormone., Disp: 90 tablet, Rfl: 1   omeprazole (PRILOSEC) 20 MG capsule, Take 2 capsules (40 mg total) by mouth daily for acid reflux., Disp: 180 capsule, Rfl: 1   Continuous Glucose Receiver (FREESTYLE LIBRE 3 READER) DEVI, Use to check blood glucose continuously. Dx code: E11.59, Z79.4 (Patient not taking: Reported on 02/17/2024), Disp: 1 each, Rfl: 0   Continuous Glucose Sensor (FREESTYLE LIBRE 3 PLUS SENSOR) MISC, Change sensor every 15 days. E11.59, Z79.4 (Patient not taking: Reported on 02/17/2024), Disp: 2 each, Rfl: 11   Multiple Vitamin (MULTI-VITAMIN DAILY) TABS, Take 1 tablet by mouth at bedtime for supplementation (Patient not taking: Reported on 02/04/2024), Disp: 14 tablet, Rfl: 0   ondansetron (ZOFRAN) 8 MG tablet, Take 1 tablet (8 mg total) by mouth every 8 (eight) hours as needed for nausea (Patient not taking: Reported on 02/17/2024), Disp: 42 tablet, Rfl: 0   polyethylene glycol (MIRALAX / GLYCOLAX) 17 g packet, Give 17 gram by mouth one time a day for constipation (Patient not taking: Reported on 02/04/2024), Disp: 14 packet, Rfl: 0   sevelamer carbonate (RENVELA) 800 MG tablet, Take 3 tablets (2,400 mg total) by mouth 3 (three) times daily  with meals and 1 tablet with snacks., Disp: 1080 tablet, Rfl: 3   Zinc 220 (50 Zn) MG CAPS, Take 1 capsule by mouth daily for minerals, electrolytes (Patient not taking: Reported on 02/04/2024), Disp: 14 capsule, Rfl: 0 Allergies  Allergen Reactions   Hydralazine Other (See Comments)   Hydralazine Hcl Other (See Comments)    Hair loss   Hydrocodone Itching and Other (See Comments)    Upset stomach   Metformin And Related Nausea And Vomiting and Other (See Comments)    Stomach pains, also   Other Nausea Only and Other (See Comments)    Lettuce- Does not digest this!!   Plaquenil [Hydroxychloroquine Sulfate] Hives   Shellfish Allergy Nausea And Vomiting   Shellfish-Derived  Products Nausea Only and Other (See Comments)    Caused an upset stomach   Shrimp (Diagnostic) Nausea Only and Other (See Comments)    Upset stomach    Sulfa Antibiotics Hives   Sulfur Hives     Social History   Socioeconomic History   Marital status: Legally Separated    Spouse name: [name]   Number of children: 1   Years of education: some colle   Highest education level: Not on file  Occupational History   Occupation: Employed FT as Warden/ranger    Comment: [employer] , NA  Tobacco Use   Smoking status: Former    Current packs/day: 0.25    Average packs/day: 0.3 packs/day for 0.5 years (0.1 ttl pk-yrs)    Types: Cigarettes    Passive exposure: Never   Smokeless tobacco: Never   Tobacco comments:    Former smoker 11/03/21  Vaping Use   Vaping status: Never Used  Substance and Sexual Activity   Alcohol use: No   Drug use: No   Sexual activity: Not Currently    Birth control/protection: None  Other Topics Concern   Not on file  Social History Narrative   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    Social Drivers of Health   Financial Resource Strain: Low Risk  (02/04/2024)   Overall Financial Resource Strain (CARDIA)    Difficulty of Paying Living Expenses: Not hard at all  Food Insecurity: No Food Insecurity (02/04/2024)   Hunger Vital Sign    Worried About Running Out of Food in the Last Year: Never true    Ran Out of Food in the Last Year: Never true  Transportation Needs: No Transportation Needs (02/04/2024)   PRAPARE - Administrator, Civil Service (Medical): No    Lack of Transportation (Non-Medical): No  Physical Activity: Inactive (02/04/2024)   Exercise Vital Sign    Days of Exercise per Week: 0 days    Minutes of Exercise per Session: 0 min  Stress: No Stress Concern Present (02/04/2024)   [employer] of Occupational Health - Occupational Stress Questionnaire    Feeling of Stress : Not at all  Social  Connections: Moderately Integrated (02/04/2024)   Social Connection and Isolation Panel [NHANES]    Frequency of Communication with Friends and Family: Twice a week    Frequency of Social Gatherings with Friends and Family: Once a week    Attends Religious Services: Never    Database administrator or Organizations: Yes    Attends Banker Meetings: Never    Marital Status: Married  Catering manager Violence: Not At Risk (02/04/2024)   Humiliation, Afraid, Rape, and Kick questionnaire    Fear of Current or  Ex-Partner: No    Emotionally Abused: No    Physically Abused: No    Sexually Abused: No    Physical Exam      Future Appointments  Date Time Provider Department Center  02/18/2024  1:30 PM [facility]  03/19/2024 11:00 AM [facility]  06/04/2024 11:10 AM Marcine Matar, MD [facility] None

## 2024-02-17 NOTE — Telephone Encounter (Signed)
 error

## 2024-02-18 ENCOUNTER — Ambulatory Visit (HOSPITAL_COMMUNITY)
Admission: RE | Admit: 2024-02-18 | Discharge: 2024-02-18 | Disposition: A | Discharge status: Home / Self Care | Payer: Medicare Other | Source: Ambulatory Visit | Attending: Vascular Surgery | Admitting: Vascular Surgery

## 2024-02-18 DIAGNOSIS — N186 End stage renal disease: Secondary | ICD-10-CM | POA: Insufficient documentation

## 2024-02-18 DIAGNOSIS — Z992 Dependence on renal dialysis: Secondary | ICD-10-CM | POA: Diagnosis not present

## 2024-02-25 ENCOUNTER — Telehealth: Payer: Self-pay

## 2024-02-25 ENCOUNTER — Other Ambulatory Visit (HOSPITAL_COMMUNITY): Payer: Self-pay | Admitting: Emergency Medicine

## 2024-02-25 ENCOUNTER — Other Ambulatory Visit: Payer: Self-pay

## 2024-02-25 NOTE — Telephone Encounter (Signed)
 Received a message form Beatrix Shipper, Paramedic  Vic Blackbird, RN Good afternoon. Nancy [name] would like to request a refill on her Alprazolam .25mg  to [facility]. @ [employer].  She takes 1 tablet Mon/Wed/Fri during her dialysis treatment  Thanks for all you do,  Routing to PCP. Please advise

## 2024-02-25 NOTE — Progress Notes (Signed)
 Paramedicine Encounter    Patient ID: Nancy [name], female    DOB: Jan 20, 1961, 63 y.o.   MRN: [id number]   Complaints NONE  Assessment A&O x 4, skin W&D w/ good color.  Pt denies chest pain or SOB.  Lung sounds clear and equal bilat and no peripheral edema noted.  Compliance with meds YES  Pill box filled x 1 week  Refills needed:  Amlodopine  Meds changes since last visit NONE    Social changes NONE   BP (!) 120/0 (BP Location: Right Arm, Patient Position: Sitting, Cuff Size: Normal) Comment: palpated  Pulse 84   Wt 228 lb (103.4 kg)   SpO2 93% Comment: room air  BMI 40.39 kg/m  Weight yesterday- not taken Last visit weight-227lb  Nancy Mrs. [name] A&O x 4, skin W&D w/ good color.  Denies chest pain or SOB.  Lung sounds clear and equal bilat.  No edema noted.  She is in great spirits today.  She asked that I reach out to her PCP for refills on her Xanax.  She is currently taking .25mg  M/W/F during her dialysis treatment.  Message sent to Dr. Laural Benes.   Med box reconciled x 1 week.  Still trying to get prior authorization on her Freestyle [place] 3 sensors.  I advised her that I will drop by [facility] to find out what is needed from Dr for Medicare Part B in order to get this prescription approved and relay that info to Dr. Henriette [name] office.  ACTION: Home visit completed  Nancy [name] [phone] 02/25/24  Patient Care Team: Nancy Matar, MD as PCP - General (Internal Medicine) Nancy Gess, MD as PCP - Cardiology (Cardiology) Nancy Morale, MD as PCP - Advanced Heart Failure (Cardiology) Center, [facility]  Patient Active Problem List   Diagnosis Date Noted   S/P bilateral BKA (below knee amputation) (HCC) 02/04/2024   History of CVA (cerebrovascular accident) 03/22/2023   Community acquired pneumonia 03/22/2023   Pain, unspecified 08/15/2022   Complication of vascular dialysis catheter 08/07/2022   Chest pain 05/30/2022   Fluid  overload 05/30/2022   Iron deficiency anemia, unspecified 04/26/2022   Nausea 04/10/2022   Hyponatremia 04/04/2022   Prolonged QT interval 04/04/2022   Anaphylactic shock, unspecified, initial encounter 02/23/2022   Other allergy, initial encounter 02/23/2022   Pressure injury of skin 02/19/2022   Anemia in chronic kidney disease 02/16/2022   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) 02/16/2022   Hyperlipidemia, unspecified 02/16/2022   Hypertensive heart and chronic kidney disease with heart failure and with stage 5 chronic kidney disease, or end stage renal disease (HCC) 02/16/2022   Personal history of nicotine dependence 02/16/2022   Personal history of transient ischemic attack (TIA), and cerebral infarction without residual deficits 02/16/2022   Secondary hyperparathyroidism of renal origin (HCC) 02/16/2022   Renal anasarca 02/14/2022   ESRD on dialysis (HCC) 02/14/2022   Hyperkalemia 02/14/2022   S/P arteriovenous (AV) fistula creation 12/22/2021   Peripheral artery disease (HCC) 10/27/2021   Acute exacerbation of congestive heart failure (HCC) 10/24/2021   Acute cardiogenic pulmonary edema (HCC) 10/06/2021   CAD (coronary artery disease) 10/06/2021   Goals of care, counseling/discussion    Gangrene of left foot (HCC)    Cellulitis of left foot    CHF (congestive heart failure) (HCC) 07/07/2021   Acute postoperative anemia due to expected blood loss superimposed on anemia of chronic renal insufficiency 06/30/2021   CKD (chronic kidney disease), stage IV (HCC) 06/11/2021  Chronic diastolic CHF (congestive heart failure) (HCC) 06/10/2021   Abnormal nuclear stress test    Dyspnea on exertion 03/07/2021   Orthopnea 02/25/2021   History of cerebrovascular accident (CVA) with residual deficit 05/06/2020   AKI (acute kidney injury) (HCC) 04/20/2020   Generalized weakness 04/20/2020   Dehydration 04/20/2020   Generalized abdominal pain    Pancreatitis, acute 02/29/2020    Cerebrovascular accident (CVA) (HCC) 11/08/2019   Stenosis of right carotid artery 11/08/2019   Cortical age-related cataract of both eyes 08/30/2019   Gait abnormality 08/30/2019   Statin declined 08/30/2019   Gastroesophageal reflux disease without esophagitis 04/18/2018   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   Insulin dependent type 2 diabetes mellitus (HCC) 10/29/2017   History of macular degeneration 10/29/2017   Chronic cervical pain 10/29/2016   Myalgia 09/14/2016   Insomnia 09/14/2016   Tinea pedis of both feet 08/20/2016   Macular degeneration 08/17/2016   Low back pain 09/21/2014   Hypertensive urgency 09/21/2014   HTN (hypertension) 09/08/2014   Thyroid nodule 08/17/2014   Morbid obesity (HCC) 08/17/2014   Hypokalemia 08/06/2014   Type II diabetes mellitus with neurological manifestations, uncontrolled 08/04/2014    Current Outpatient Medications:    Accu-Chek Softclix Lancets lancets, Use to check blood sugar 3 times daily., Disp: 100 each, Rfl: 6   acetaminophen (TYLENOL) 500 MG tablet, Take 1,000 mg by mouth every 8 (eight) hours as needed for mild pain (pain score 1-3), moderate pain (pain score 4-6) or headache., Disp: , Rfl:    albuterol (VENTOLIN HFA) 108 (90 Base) MCG/ACT inhaler, Inhale 2 puffs into the lungs every 6 (six) hours as needed., Disp: 18 g, Rfl: 3   amLODipine (NORVASC) 10 MG tablet, Take 1 tablet (10 mg total) by mouth daily for hypertension, Disp: 90 tablet, Rfl: 1   aspirin 81 MG EC tablet, Take 1 tablet (81 mg total) by mouth daily., Disp: 60 tablet, Rfl: 1   atorvastatin (LIPITOR) 40 MG tablet, Take 1 tablet (40 mg total) by mouth daily for cholesterol., Disp: 90 tablet, Rfl: 1   ezetimibe (ZETIA) 10 MG tablet, Take 1 tablet (10 mg total) by mouth daily for cholesterol., Disp: 90 tablet, Rfl: 1   insulin glargine (LANTUS SOLOSTAR) 100 UNIT/ML Solostar Pen, Inject 3 Units into the skin 2 (two) times daily., Disp: 15 mL, Rfl: PRN   Insulin Pen Needle (PEN  NEEDLES) 31G X 8 MM MISC, Use for injection with insulin pen daily., Disp: 100 each, Rfl: 6   isosorbide mononitrate (IMDUR) 30 MG 24 hr tablet, Take 2 tablets (60 mg total) by mouth daily for blood pressure., Disp: 180 tablet, Rfl: 1   labetalol (NORMODYNE) 300 MG tablet, Take 1 tablet (300 mg total) by mouth 2 (two) times daily., Disp: 180 tablet, Rfl: 1   levothyroxine (SYNTHROID) 25 MCG tablet, Take 1 tablet (25 mcg total) by mouth daily for low thyroid hormone., Disp: 90 tablet, Rfl: 1   omeprazole (PRILOSEC) 20 MG capsule, Take 2 capsules (40 mg total) by mouth daily for acid reflux., Disp: 180 capsule, Rfl: 1   sevelamer carbonate (RENVELA) 800 MG tablet, Take 3 tablets (2,400 mg total) by mouth 3 (three) times daily with meals and 1 tablet with snacks., Disp: 1080 tablet, Rfl: 3   ALPRAZolam (XANAX) 0.25 MG tablet, Take 1 tablet (0.25 mg total) by mouth on Monday, Wednesday and Friday at Hemodialysis., Disp: 12 tablet, Rfl: 2   Blood Glucose Monitoring Suppl (ACCU-CHEK GUIDE) w/Device KIT, Use to check blood sugar  3 times daily. (Patient not taking: Reported on 02/25/2024), Disp: 1 kit, Rfl: 0   Continuous Glucose Receiver (FREESTYLE LIBRE 3 READER) DEVI, Use to check blood glucose continuously. Dx code: E11.59, Z79.4 (Patient not taking: Reported on 02/11/2024), Disp: 1 each, Rfl: 0   Continuous Glucose Sensor (FREESTYLE LIBRE 3 PLUS SENSOR) MISC, Change sensor every 15 days. E11.59, Z79.4 (Patient not taking: Reported on 02/11/2024), Disp: 2 each, Rfl: 11   glucose blood (ACCU-CHEK GUIDE) test strip, Use to check blood sugar 3 times daily. (Patient not taking: Reported on 02/25/2024), Disp: 100 each, Rfl: 6   Multiple Vitamin (MULTI-VITAMIN DAILY) TABS, Take 1 tablet by mouth at bedtime for supplementation (Patient not taking: Reported on 02/04/2024), Disp: 14 tablet, Rfl: 0   ondansetron (ZOFRAN) 8 MG tablet, Take 1 tablet (8 mg total) by mouth every 8 (eight) hours as needed for nausea (Patient  not taking: Reported on 02/25/2024), Disp: 42 tablet, Rfl: 0   polyethylene glycol (MIRALAX / GLYCOLAX) 17 g packet, Give 17 gram by mouth one time a day for constipation (Patient not taking: Reported on 02/04/2024), Disp: 14 packet, Rfl: 0   Zinc 220 (50 Zn) MG CAPS, Take 1 capsule by mouth daily for minerals, electrolytes (Patient not taking: Reported on 02/04/2024), Disp: 14 capsule, Rfl: 0 Allergies  Allergen Reactions   Hydralazine Other (See Comments)   Hydralazine Hcl Other (See Comments)    Hair loss   Hydrocodone Itching and Other (See Comments)    Upset stomach   Metformin And Related Nausea And Vomiting and Other (See Comments)    Stomach pains, also   Other Nausea Only and Other (See Comments)    Lettuce- Does not digest this!!   Plaquenil [Hydroxychloroquine Sulfate] Hives   Shellfish Allergy Nausea And Vomiting   Shellfish-Derived Products Nausea Only and Other (See Comments)    Caused an upset stomach   Shrimp (Diagnostic) Nausea Only and Other (See Comments)    Upset stomach    Sulfa Antibiotics Hives   Sulfur Hives     Social History   Socioeconomic History   Marital status: Legally Separated    Spouse name: [name]   Number of children: 1   Years of education: some colle   Highest education level: Not on file  Occupational History   Occupation: Employed FT as Warden/ranger    Comment: [employer] , NA  Tobacco Use   Smoking status: Former    Current packs/day: 0.25    Average packs/day: 0.3 packs/day for 0.5 years (0.1 ttl pk-yrs)    Types: Cigarettes    Passive exposure: Never   Smokeless tobacco: Never   Tobacco comments:    Former smoker 11/03/21  Vaping Use   Vaping status: Never Used  Substance and Sexual Activity   Alcohol use: No   Drug use: No   Sexual activity: Not Currently    Birth control/protection: None  Other Topics Concern   Not on file  Social History Narrative   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     Social Drivers of Health   Financial Resource Strain: Low Risk  (02/04/2024)   Overall Financial Resource Strain (CARDIA)    Difficulty of Paying Living Expenses: Not hard at all  Food Insecurity: No Food Insecurity (02/04/2024)   Hunger Vital Sign    Worried About Running Out of Food in the Last Year: Never true    Ran Out of Food in the Last Year: Never true  Transportation Needs: No Transportation Needs (02/04/2024)   PRAPARE - Administrator, Civil Service (Medical): No    Lack of Transportation (Non-Medical): No  Physical Activity: Inactive (02/04/2024)   Exercise Vital Sign    Days of Exercise per Week: 0 days    Minutes of Exercise per Session: 0 min  Stress: No Stress Concern Present (02/04/2024)   [employer] of Occupational Health - Occupational Stress Questionnaire    Feeling of Stress : Not at all  Social Connections: Moderately Integrated (02/04/2024)   Social Connection and Isolation Panel [NHANES]    Frequency of Communication with Friends and Family: Twice a week    Frequency of Social Gatherings with Friends and Family: Once a week    Attends Religious Services: Never    Database administrator or Organizations: Yes    Attends Banker Meetings: Never    Marital Status: Married  Catering manager Violence: Not At Risk (02/04/2024)   Humiliation, Afraid, Rape, and Kick questionnaire    Fear of Current or Ex-Partner: No    Emotionally Abused: No    Physically Abused: No    Sexually Abused: No    Physical Exam      Future Appointments  Date Time Provider Department Center  03/19/2024 11:00 AM [facility]  06/04/2024 11:10 AM Nancy Matar, MD [facility] None

## 2024-02-26 NOTE — Telephone Encounter (Signed)
 Last alprazolam prescription was written 01/29/2024 for 1 month supply with 2 additional refills.

## 2024-02-28 ENCOUNTER — Other Ambulatory Visit: Payer: Self-pay

## 2024-02-28 ENCOUNTER — Other Ambulatory Visit (HOSPITAL_COMMUNITY): Payer: Self-pay

## 2024-03-03 ENCOUNTER — Other Ambulatory Visit (HOSPITAL_COMMUNITY): Payer: Self-pay

## 2024-03-03 ENCOUNTER — Other Ambulatory Visit (HOSPITAL_COMMUNITY): Payer: Self-pay | Admitting: Emergency Medicine

## 2024-03-03 NOTE — Progress Notes (Unsigned)
 Paramedicine Encounter    Patient ID: Nancy [name], female    DOB: 1961-11-03, 63 y.o.   MRN: [id number]   Complaints NONE  Assessment A&O x 4, skin W&D w/ good color.  Denies chest pain or SOB.  Lung sounds clear and equal bilat. No edema noted  Compliance with meds YES  Pill box filled x 1 week  [facility]  Meds changes since last visit NONE    Social changes NONE   BP (!) 150/0 (BP Location: Right Arm, Patient Position: Sitting, Cuff Size: Normal) Comment: palpated  Pulse 80   Wt 231 lb 3.2 oz (104.9 kg)   SpO2 99%   BMI 40.96 kg/m  Weight yesterday-not taken Last visit weight-228.lb  ACTION: Home visit completed  Bethanie Dicker [phone] 03/03/24  Patient Care Team: Marcine Matar, MD as PCP - General (Internal Medicine) Runell Gess, MD as PCP - Cardiology (Cardiology) Laurey Morale, MD as PCP - Advanced Heart Failure (Cardiology) Center, [facility]  Patient Active Problem List   Diagnosis Date Noted  . S/P bilateral BKA (below knee amputation) (HCC) 02/04/2024  . History of CVA (cerebrovascular accident) 03/22/2023  . Community acquired pneumonia 03/22/2023  . Pain, unspecified 08/15/2022  . Complication of vascular dialysis catheter 08/07/2022  . Chest pain 05/30/2022  . Fluid overload 05/30/2022  . Iron deficiency anemia, unspecified 04/26/2022  . Nausea 04/10/2022  . Hyponatremia 04/04/2022  . Prolonged QT interval 04/04/2022  . Anaphylactic shock, unspecified, initial encounter 02/23/2022  . Other allergy, initial encounter 02/23/2022  . Pressure injury of skin 02/19/2022  . Anemia in chronic kidney disease 02/16/2022  . 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 Hyperlipidemia, unspecified 02/16/2022  . Hypertensive heart and chronic kidney disease with heart failure and with stage 5 chronic kidney disease, or end stage renal  disease (HCC) 02/16/2022  . Personal history of nicotine dependence 02/16/2022  . Personal history of transient ischemic attack (TIA), and cerebral infarction without residual deficits 02/16/2022  . Secondary hyperparathyroidism of renal origin (HCC) 02/16/2022  . Renal anasarca 02/14/2022  . ESRD on dialysis (HCC) 02/14/2022  . Hyperkalemia 02/14/2022  . S/P arteriovenous (AV) fistula creation 12/22/2021  . Peripheral artery disease (HCC) 10/27/2021  . Acute exacerbation of congestive heart failure (HCC) 10/24/2021  . Acute cardiogenic pulmonary edema (HCC) 10/06/2021  . CAD (coronary artery disease) 10/06/2021  . Goals of care, counseling/discussion   . Gangrene of left foot (HCC)   . Cellulitis of left foot   . CHF (congestive heart failure) (HCC) 07/07/2021  . Acute postoperative anemia due to expected blood loss superimposed on anemia of chronic renal insufficiency 06/30/2021  . CKD (chronic kidney disease), stage IV (HCC) 06/11/2021  . Chronic diastolic CHF (congestive heart failure) (HCC) 06/10/2021  . Abnormal nuclear stress test   . Dyspnea on exertion 03/07/2021  . Orthopnea 02/25/2021  . History of cerebrovascular accident (CVA) with residual deficit 05/06/2020  . AKI (acute kidney injury) (HCC) 04/20/2020  . Generalized weakness 04/20/2020  . Dehydration 04/20/2020  . Generalized abdominal pain   . Pancreatitis, acute 02/29/2020  . Cerebrovascular accident (CVA) (HCC) 11/08/2019  . Stenosis of right carotid artery 11/08/2019  . Cortical age-related cataract of both eyes 08/30/2019  . Gait abnormality 08/30/2019  . Statin declined 08/30/2019  . Gastroesophageal reflux disease without esophagitis 04/18/2018  . 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 Insulin dependent type 2 diabetes mellitus (HCC) 10/29/2017  .  History of macular degeneration 10/29/2017  . Chronic cervical pain 10/29/2016  . Myalgia 09/14/2016  . Insomnia 09/14/2016  . Tinea pedis of both feet 08/20/2016  .  Macular degeneration 08/17/2016  . Low back pain 09/21/2014  . Hypertensive urgency 09/21/2014  . HTN (hypertension) 09/08/2014  . Thyroid nodule 08/17/2014  . Morbid obesity (HCC) 08/17/2014  . Hypokalemia 08/06/2014  . Type II diabetes mellitus with neurological manifestations, uncontrolled 08/04/2014    Current Outpatient Medications:  .  Accu-Chek Softclix Lancets lancets, Use to check blood sugar 3 times daily., Disp: 100 each, Rfl: 6 .  acetaminophen (TYLENOL) 500 MG tablet, Take 1,000 mg by mouth every 8 (eight) hours as needed for mild pain (pain score 1-3), moderate pain (pain score 4-6) or headache., Disp: , Rfl:  .  albuterol (VENTOLIN HFA) 108 (90 Base) MCG/ACT inhaler, Inhale 2 puffs into the lungs every 6 (six) hours as needed., Disp: 18 g, Rfl: 3 .  ALPRAZolam (XANAX) 0.25 MG tablet, Take 1 tablet (0.25 mg total) by mouth on Monday, Wednesday and Friday at Hemodialysis., Disp: 12 tablet, Rfl: 2 .  amLODipine (NORVASC) 10 MG tablet, Take 1 tablet (10 mg total) by mouth daily for hypertension, Disp: 90 tablet, Rfl: 1 .  aspirin 81 MG EC tablet, Take 1 tablet (81 mg total) by mouth daily., Disp: 60 tablet, Rfl: 1 .  atorvastatin (LIPITOR) 40 MG tablet, Take 1 tablet (40 mg total) by mouth daily for cholesterol., Disp: 90 tablet, Rfl: 1 .  Blood Glucose Monitoring Suppl (ACCU-CHEK GUIDE) w/Device KIT, Use to check blood sugar 3 times daily. (Patient not taking: Reported on 02/25/2024), Disp: 1 kit, Rfl: 0 .  Continuous Glucose Receiver (FREESTYLE LIBRE 3 READER) DEVI, Use to check blood glucose continuously. Dx code: E11.59, Z79.4 (Patient not taking: Reported on 03/03/2024), Disp: 1 each, Rfl: 0 .  Continuous Glucose Sensor (FREESTYLE LIBRE 3 PLUS SENSOR) MISC, Change sensor every 15 days. E11.59, Z79.4 (Patient not taking: Reported on 03/03/2024), Disp: 2 each, Rfl: 11 .  ezetimibe (ZETIA) 10 MG tablet, Take 1 tablet (10 mg total) by mouth daily for cholesterol., Disp: 90 tablet, Rfl:  1 .  glucose blood (ACCU-CHEK GUIDE) test strip, Use to check blood sugar 3 times daily. (Patient not taking: Reported on 02/25/2024), Disp: 100 each, Rfl: 6 .  insulin glargine (LANTUS SOLOSTAR) 100 UNIT/ML Solostar Pen, Inject 3 Units into the skin 2 (two) times daily., Disp: 15 mL, Rfl: PRN .  Insulin Pen Needle (PEN NEEDLES) 31G X 8 MM MISC, Use for injection with insulin pen daily., Disp: 100 each, Rfl: 6 .  isosorbide mononitrate (IMDUR) 30 MG 24 hr tablet, Take 2 tablets (60 mg total) by mouth daily for blood pressure., Disp: 180 tablet, Rfl: 1 .  labetalol (NORMODYNE) 300 MG tablet, Take 1 tablet (300 mg total) by mouth 2 (two) times daily., Disp: 180 tablet, Rfl: 1 .  levothyroxine (SYNTHROID) 25 MCG tablet, Take 1 tablet (25 mcg total) by mouth daily for low thyroid hormone., Disp: 90 tablet, Rfl: 1 .  Multiple Vitamin (MULTI-VITAMIN DAILY) TABS, Take 1 tablet by mouth at bedtime for supplementation (Patient not taking: Reported on 02/04/2024), Disp: 14 tablet, Rfl: 0 .  omeprazole (PRILOSEC) 20 MG capsule, Take 2 capsules (40 mg total) by mouth daily for acid reflux., Disp: 180 capsule, Rfl: 1 .  ondansetron (ZOFRAN) 8 MG tablet, Take 1 tablet (8 mg total) by mouth every 8 (eight) hours as needed for nausea (Patient not taking: Reported  on 03/03/2024), Disp: 42 tablet, Rfl: 0 .  polyethylene glycol (MIRALAX / GLYCOLAX) 17 g packet, Give 17 gram by mouth one time a day for constipation (Patient not taking: Reported on 02/04/2024), Disp: 14 packet, Rfl: 0 .  sevelamer carbonate (RENVELA) 800 MG tablet, Take 3 tablets (2,400 mg total) by mouth 3 (three) times daily with meals and 1 tablet with snacks., Disp: 1080 tablet, Rfl: 3 .  Zinc 220 (50 Zn) MG CAPS, Take 1 capsule by mouth daily for minerals, electrolytes (Patient not taking: Reported on 02/04/2024), Disp: 14 capsule, Rfl: 0 Allergies  Allergen Reactions  . Hydralazine Other (See Comments)  . Hydralazine Hcl Other (See Comments)    Hair  loss  . Hydrocodone Itching and Other (See Comments)    Upset stomach  . Metformin And Related Nausea And Vomiting and Other (See Comments)    Stomach pains, also  . Other Nausea Only and Other (See Comments)    Lettuce- Does not digest this!!  . Plaquenil [Hydroxychloroquine Sulfate] Hives  . Shellfish Allergy Nausea And Vomiting  . Shellfish-Derived Products Nausea Only and Other (See Comments)    Caused an upset stomach  . Shrimp (Diagnostic) Nausea Only and Other (See Comments)    Upset stomach   . Sulfa Antibiotics Hives  . Sulfur Hives     Social History   Socioeconomic History  . Marital status: Legally Separated    Spouse name: [name]  . Number of children: 1  . Years of education: some colle  . Highest education level: Not on file  Occupational History  . Occupation: Employed FT as Warden/ranger    Comment: [employer] , NA  Tobacco Use  . Smoking status: Former    Current packs/day: 0.25    Average packs/day: 0.3 packs/day for 0.5 years (0.1 ttl pk-yrs)    Types: Cigarettes    Passive exposure: Never  . Smokeless tobacco: Never  . Tobacco comments:    Former smoker 11/03/21  Vaping Use  . Vaping status: Never Used  Substance and Sexual Activity  . Alcohol use: No  . Drug use: No  . Sexual activity: Not Currently    Birth control/protection: None  Other Topics Concern  . 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 Difficulty of Paying Living Expenses: Not hard at all  Food Insecurity: No Food Insecurity (02/04/2024)   Hunger Vital Sign   . Worried About Programme researcher, broadcasting/film/video in the Last Year: Never true   . Ran Out of Food in the Last Year: Never true  Transportation Needs: No Transportation Needs (02/04/2024)   PRAPARE - Transportation   . Lack of  Transportation (Medical): No   . Lack of Transportation (Non-Medical): No  Physical Activity: Inactive (02/04/2024)   Exercise Vital Sign   . Days of Exercise per Week: 0 days   . Minutes of Exercise per Session: 0 min  Stress: No Stress Concern Present (02/04/2024)   [employer] of Occupational Health - Occupational Stress Questionnaire   . Feeling of Stress : Not at all  Social Connections: Moderately Integrated (02/04/2024)   Social Connection and Isolation Panel [NHANES]   . Frequency of Communication with Friends and Family: Twice a week   . Frequency of Social Gatherings with Friends and  Family: Once a week   . Attends Religious Services: Never   . Active Member of Clubs or Organizations: Yes   . Attends Banker Meetings: Never   . Marital Status: Married  Catering manager Violence: Not At Risk (02/04/2024)   Humiliation, Afraid, Rape, and Kick questionnaire   . Fear of Current or Ex-Partner: No   . Emotionally Abused: No   . Physically Abused: No   . Sexually Abused: No    Physical Exam      Future Appointments  Date Time Provider Department Center  03/19/2024 11:00 AM [facility]  06/04/2024 11:10 AM Marcine Matar, MD [facility] None

## 2024-03-10 ENCOUNTER — Other Ambulatory Visit (HOSPITAL_COMMUNITY): Payer: Self-pay

## 2024-03-10 ENCOUNTER — Other Ambulatory Visit (HOSPITAL_COMMUNITY): Payer: Self-pay | Admitting: Internal Medicine

## 2024-03-10 ENCOUNTER — Other Ambulatory Visit (HOSPITAL_COMMUNITY): Payer: Self-pay | Admitting: Emergency Medicine

## 2024-03-10 ENCOUNTER — Other Ambulatory Visit: Payer: Self-pay

## 2024-03-10 ENCOUNTER — Telehealth: Payer: Self-pay

## 2024-03-10 DIAGNOSIS — I10 Essential (primary) hypertension: Secondary | ICD-10-CM

## 2024-03-10 NOTE — Progress Notes (Unsigned)
 Paramedicine Encounter    Patient ID: Nancy [name], female    DOB: 1961/01/23, 63 y.o.   MRN: [id number]   Complaints NONE  Assessment***  Compliance with meds YES  Pill box filled x 1 week  Refills needed NONE  Meds changes since last visit NONE    Social changes NONE   BP (!) 140/0 (BP Location: Right Arm, Patient Position: Sitting, Cuff Size: Normal) Comment: palpated  Pulse 80   Resp 15   Wt 223 lb 6.4 oz (101.3 kg)   SpO2 100%   BMI 39.57 kg/m  Weight yesterday- not taken Last visit weight-231lb CBG 253  Called Nancy [name] regarding paperwork for Nancy [name] for Nancy [name].    ACTION: {Paramed Action:[phone]}  Nancy [name], EMT-Paramedic [phone] 03/10/24  Patient Care Team: Marcine Matar, MD as PCP - General (Internal Medicine) Runell Gess, MD as PCP - Cardiology (Cardiology) Laurey Morale, MD as PCP - Advanced Heart Failure (Cardiology) Center, [facility]  Patient Active Problem List   Diagnosis Date Noted  . S/P bilateral BKA (below knee amputation) (HCC) 02/04/2024  . History of CVA (cerebrovascular accident) 03/22/2023  . Community acquired pneumonia 03/22/2023  . Pain, unspecified 08/15/2022  . Complication of vascular dialysis catheter 08/07/2022  . Chest pain 05/30/2022  . Fluid overload 05/30/2022  . Iron deficiency anemia, unspecified 04/26/2022  . Nausea 04/10/2022  . Hyponatremia 04/04/2022  . Prolonged QT interval 04/04/2022  . Anaphylactic shock, unspecified, initial encounter 02/23/2022  . Other allergy, initial encounter 02/23/2022  . Pressure injury of skin 02/19/2022  . Anemia in chronic kidney disease 02/16/2022  . 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 Hyperlipidemia, unspecified 02/16/2022  . Hypertensive heart and chronic kidney disease with heart failure and with stage 5 chronic kidney disease, or end stage renal disease (HCC) 02/16/2022  . Personal history of nicotine dependence  02/16/2022  . Personal history of transient ischemic attack (TIA), and cerebral infarction without residual deficits 02/16/2022  . Secondary hyperparathyroidism of renal origin (HCC) 02/16/2022  . Renal anasarca 02/14/2022  . ESRD on dialysis (HCC) 02/14/2022  . Hyperkalemia 02/14/2022  . S/P arteriovenous (AV) fistula creation 12/22/2021  . Peripheral artery disease (HCC) 10/27/2021  . Acute exacerbation of congestive heart failure (HCC) 10/24/2021  . Acute cardiogenic pulmonary edema (HCC) 10/06/2021  . CAD (coronary artery disease) 10/06/2021  . Goals of care, counseling/discussion   . Gangrene of left foot (HCC)   . Cellulitis of left foot   . CHF (congestive heart failure) (HCC) 07/07/2021  . Acute postoperative anemia due to expected blood loss superimposed on anemia of chronic renal insufficiency 06/30/2021  . CKD (chronic kidney disease), stage IV (HCC) 06/11/2021  . Chronic diastolic CHF (congestive heart failure) (HCC) 06/10/2021  . Abnormal nuclear stress test   . Dyspnea on exertion 03/07/2021  . Orthopnea 02/25/2021  . History of cerebrovascular accident (CVA) with residual deficit 05/06/2020  . AKI (acute kidney injury) (HCC) 04/20/2020  . Generalized weakness 04/20/2020  . Dehydration 04/20/2020  . Generalized abdominal pain   . Pancreatitis, acute 02/29/2020  . Cerebrovascular accident (CVA) (HCC) 11/08/2019  . Stenosis of right carotid artery 11/08/2019  . Cortical age-related cataract of both eyes 08/30/2019  . Gait abnormality 08/30/2019  . Statin declined 08/30/2019  . Gastroesophageal reflux disease without esophagitis 04/18/2018  . 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 Insulin dependent type 2 diabetes mellitus (HCC) 10/29/2017  . History of macular degeneration 10/29/2017  . Chronic cervical pain 10/29/2016  .  Myalgia 09/14/2016  . Insomnia 09/14/2016  . Tinea pedis of both feet 08/20/2016  . Macular degeneration 08/17/2016  . Low back pain 09/21/2014  .  Hypertensive urgency 09/21/2014  . HTN (hypertension) 09/08/2014  . Thyroid nodule 08/17/2014  . Morbid obesity (HCC) 08/17/2014  . Hypokalemia 08/06/2014  . Type II diabetes mellitus with neurological manifestations, uncontrolled 08/04/2014    Current Outpatient Medications:  .  acetaminophen (TYLENOL) 500 MG tablet, Take 1,000 mg by mouth every 8 (eight) hours as needed for mild pain (pain score 1-3), moderate pain (pain score 4-6) or headache., Disp: , Rfl:  .  albuterol (VENTOLIN HFA) 108 (90 Base) MCG/ACT inhaler, Inhale 2 puffs into the lungs every 6 (six) hours as needed., Disp: 18 g, Rfl: 3 .  ALPRAZolam (XANAX) 0.25 MG tablet, Take 1 tablet (0.25 mg total) by mouth on Monday, Wednesday and Friday at Hemodialysis., Disp: 12 tablet, Rfl: 2 .  amLODipine (NORVASC) 10 MG tablet, Take 1 tablet (10 mg total) by mouth daily for hypertension, Disp: 90 tablet, Rfl: 1 .  aspirin 81 MG EC tablet, Take 1 tablet (81 mg total) by mouth daily., Disp: 60 tablet, Rfl: 1 .  atorvastatin (LIPITOR) 40 MG tablet, Take 1 tablet (40 mg total) by mouth daily for cholesterol., Disp: 90 tablet, Rfl: 1 .  ezetimibe (ZETIA) 10 MG tablet, Take 1 tablet (10 mg total) by mouth daily for cholesterol., Disp: 90 tablet, Rfl: 1 .  insulin glargine (LANTUS SOLOSTAR) 100 UNIT/ML Solostar Pen, Inject 3 Units into the skin 2 (two) times daily., Disp: 15 mL, Rfl: PRN .  Insulin Pen Needle (PEN NEEDLES) 31G X 8 MM MISC, Use for injection with insulin pen daily., Disp: 100 each, Rfl: 6 .  isosorbide mononitrate (IMDUR) 30 MG 24 hr tablet, Take 2 tablets (60 mg total) by mouth daily for blood pressure., Disp: 180 tablet, Rfl: 1 .  labetalol (NORMODYNE) 300 MG tablet, Take 1 tablet (300 mg total) by mouth 2 (two) times daily., Disp: 180 tablet, Rfl: 1 .  levothyroxine (SYNTHROID) 25 MCG tablet, Take 1 tablet (25 mcg total) by mouth daily for low thyroid hormone., Disp: 90 tablet, Rfl: 1 .  omeprazole (PRILOSEC) 20 MG capsule,  Take 2 capsules (40 mg total) by mouth daily for acid reflux., Disp: 180 capsule, Rfl: 1 .  sevelamer carbonate (RENVELA) 800 MG tablet, Take 3 tablets (2,400 mg total) by mouth 3 (three) times daily with meals and 1 tablet with snacks., Disp: 1080 tablet, Rfl: 3 .  Accu-Chek Softclix Lancets lancets, Use to check blood sugar 3 times daily. (Patient not taking: Reported on 03/10/2024), Disp: 100 each, Rfl: 6 .  Blood Glucose Monitoring Suppl (ACCU-CHEK GUIDE) w/Device KIT, Use to check blood sugar 3 times daily. (Patient not taking: Reported on 02/25/2024), Disp: 1 kit, Rfl: 0 .  Continuous Glucose Receiver (FREESTYLE LIBRE 3 READER) DEVI, Use to check blood glucose continuously. Dx code: E11.59, Z79.4 (Patient not taking: Reported on 02/11/2024), Disp: 1 each, Rfl: 0 .  Continuous Glucose Sensor (FREESTYLE LIBRE 3 PLUS SENSOR) MISC, Change sensor every 15 days. E11.59, Z79.4 (Patient not taking: Reported on 02/11/2024), Disp: 2 each, Rfl: 11 .  glucose blood (ACCU-CHEK GUIDE) test strip, Use to check blood sugar 3 times daily. (Patient not taking: Reported on 02/25/2024), Disp: 100 each, Rfl: 6 .  Multiple Vitamin (MULTI-VITAMIN DAILY) TABS, Take 1 tablet by mouth at bedtime for supplementation (Patient not taking: Reported on 02/04/2024), Disp: 14 tablet, Rfl: 0 .  ondansetron (ZOFRAN) 8 MG tablet, Take 1 tablet (8 mg total) by mouth every 8 (eight) hours as needed for nausea (Patient not taking: Reported on 03/03/2024), Disp: 42 tablet, Rfl: 0 .  polyethylene glycol (MIRALAX / GLYCOLAX) 17 g packet, Give 17 gram by mouth one time a day for constipation (Patient not taking: Reported on 02/04/2024), Disp: 14 packet, Rfl: 0 .  Zinc 220 (50 Zn) MG CAPS, Take 1 capsule by mouth daily for minerals, electrolytes (Patient not taking: Reported on 02/04/2024), Disp: 14 capsule, Rfl: 0 Allergies  Allergen Reactions  . Hydralazine Other (See Comments)  . Hydralazine Hcl Other (See Comments)    Hair loss  . Hydrocodone  Itching and Other (See Comments)    Upset stomach  . Metformin And Related Nausea And Vomiting and Other (See Comments)    Stomach pains, also  . Other Nausea Only and Other (See Comments)    Lettuce- Does not digest this!!  . Plaquenil [Hydroxychloroquine Sulfate] Hives  . Shellfish Allergy Nausea And Vomiting  . Shellfish-Derived Products Nausea Only and Other (See Comments)    Caused an upset stomach  . Shrimp (Diagnostic) Nausea Only and Other (See Comments)    Upset stomach   . Sulfa Antibiotics Hives  . Sulfur Hives     Social History   Socioeconomic History  . Marital status: Legally Separated    Spouse name: [name]  . Number of children: 1  . Years of education: some colle  . Highest education level: Not on file  Occupational History  . Occupation: Employed FT as Warden/ranger    Comment: [employer] , NA  Tobacco Use  . Smoking status: Former    Current packs/day: 0.25    Average packs/day: 0.3 packs/day for 0.5 years (0.1 ttl pk-yrs)    Types: Cigarettes    Passive exposure: Never  . Smokeless tobacco: Never  . Tobacco comments:    Former smoker 11/03/21  Vaping Use  . Vaping status: Never Used  Substance and Sexual Activity  . Alcohol use: No  . Drug use: No  . Sexual activity: Not Currently    Birth control/protection: None  Other Topics Concern  . 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 Difficulty of Paying Living Expenses: Not hard at all  Food Insecurity: No Food Insecurity (02/04/2024)   Hunger Vital Sign   . Worried About Programme researcher, broadcasting/film/video in the Last Year: Never true   . Ran Out of Food in the Last Year: Never true  Transportation Needs: No Transportation Needs (02/04/2024)   PRAPARE - Transportation   . Lack of Transportation (Medical): No   .  Lack of Transportation (Non-Medical): No  Physical Activity: Inactive (02/04/2024)   Exercise Vital Sign   . Days of Exercise per Week: 0 days   . Minutes of Exercise per Session: 0 min  Stress: No Stress Concern Present (02/04/2024)   [employer] of Occupational Health - Occupational Stress Questionnaire   . Feeling of Stress : Not at all  Social Connections: Moderately Integrated (02/04/2024)   Social Connection and Isolation Panel [NHANES]   . Frequency of Communication with Friends and Family: Twice a week   . Frequency of Social Gatherings with Friends and Family: Once a week   .  Attends Religious Services: Never   . Active Member of Clubs or Organizations: Yes   . Attends Banker Meetings: Never   . Marital Status: Married  Catering manager Violence: Not At Risk (02/04/2024)   Humiliation, Afraid, Rape, and Kick questionnaire   . Fear of Current or Ex-Partner: No   . Emotionally Abused: No   . Physically Abused: No   . Sexually Abused: No    Physical Exam      Future Appointments  Date Time Provider Department Center  03/19/2024 11:00 AM [facility]  06/04/2024 11:10 AM Marcine Matar, MD [facility] None

## 2024-03-10 NOTE — Telephone Encounter (Signed)
 Spoke with Nancy [name] , Paramedic patient needs Amlodipine sent [facility]. Call to [facility] to request Amlodipine be transferred spoke with [place].

## 2024-03-11 ENCOUNTER — Other Ambulatory Visit (HOSPITAL_COMMUNITY): Payer: Self-pay

## 2024-03-11 ENCOUNTER — Encounter (HOSPITAL_COMMUNITY): Payer: Self-pay

## 2024-03-17 ENCOUNTER — Other Ambulatory Visit (HOSPITAL_COMMUNITY): Payer: Self-pay

## 2024-03-17 ENCOUNTER — Other Ambulatory Visit (HOSPITAL_COMMUNITY): Payer: Self-pay | Admitting: Emergency Medicine

## 2024-03-17 NOTE — Progress Notes (Unsigned)
 Paramedicine Encounter    Patient ID: Nancy [name], female    DOB: 04/30/1961, 63 y.o.   MRN: [id number]   Complaints NONE  Assessment A &O x 4, skin W&D w/ good color. Denies chest pain or SOB.  Lung sounds clear throughout. Np edema noted.  Compliance with meds YES  Pill box filled x1 week  Refills needed Amlodopine  Meds changes since last visit NONE    Social changes NONE   BP (!) 180/100 (BP Location: Right Arm, Patient Position: Sitting, Cuff Size: Normal)   Pulse 81   Resp 16   Wt 226 lb 6.4 oz (102.7 kg)   SpO2 97%   BMI 40.10 kg/m  Weight yesterday- not taken Last visit weight-228lb CBG 248  Pt's BP is elevated today.  She denies headache or visual disturbances.  She has taken her morning meds.  She say she had a sausage, egg and cheese sandwich for breakfast this morning and having a hot ham and cheese sandwich from [employer].  Discussed making better nutritional choices and she advises she understands same.   Pt's med box reconciled x 1 week. Had to report to pharmacy that pt's Amlodopine has been lost.  Will be able to get a 30 day supply from pharmacy for $5.00.  I will p/u later this afternoon and drop them by her house. Also reached out to [employer]  regarding status of Freestyle [place] sensors.  Still no resolution.  I will fax him paperwork received from [facility] to try and figure it out.  ACTION: Home visit completed   Bethanie Dicker [phone] 03/17/24  Patient Care Team: Marcine Matar, MD as PCP - General (Internal Medicine) Runell Gess, MD as PCP - Cardiology (Cardiology) Laurey Morale, MD as PCP - Advanced Heart Failure (Cardiology) Center, [facility]  Patient Active Problem List   Diagnosis Date Noted   S/P bilateral BKA (below knee amputation) (HCC) 02/04/2024   History of CVA (cerebrovascular accident) 03/22/2023   Community acquired pneumonia 03/22/2023   Pain, unspecified 08/15/2022   Complication  of vascular dialysis catheter 08/07/2022   Chest pain 05/30/2022   Fluid overload 05/30/2022   Iron deficiency anemia, unspecified 04/26/2022   Nausea 04/10/2022   Hyponatremia 04/04/2022   Prolonged QT interval 04/04/2022   Anaphylactic shock, unspecified, initial encounter 02/23/2022   Other allergy, initial encounter 02/23/2022   Pressure injury of skin 02/19/2022   Anemia in chronic kidney disease 02/16/2022   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) 02/16/2022   Hyperlipidemia, unspecified 02/16/2022   Hypertensive heart and chronic kidney disease with heart failure and with stage 5 chronic kidney disease, or end stage renal disease (HCC) 02/16/2022   Personal history of nicotine dependence 02/16/2022   Personal history of transient ischemic attack (TIA), and cerebral infarction without residual deficits 02/16/2022   Secondary hyperparathyroidism of renal origin (HCC) 02/16/2022   Renal anasarca 02/14/2022   ESRD on dialysis (HCC) 02/14/2022   Hyperkalemia 02/14/2022   S/P arteriovenous (AV) fistula creation 12/22/2021   Peripheral artery disease (HCC) 10/27/2021   Acute exacerbation of congestive heart failure (HCC) 10/24/2021   Acute cardiogenic pulmonary edema (HCC) 10/06/2021   CAD (coronary artery disease) 10/06/2021   Goals of care, counseling/discussion    Gangrene of left foot (HCC)    Cellulitis of left foot    CHF (congestive heart failure) (HCC) 07/07/2021   Acute postoperative anemia due to expected blood loss superimposed on anemia of chronic renal insufficiency 06/30/2021  CKD (chronic kidney disease), stage IV (HCC) 06/11/2021   Chronic diastolic CHF (congestive heart failure) (HCC) 06/10/2021   Abnormal nuclear stress test    Dyspnea on exertion 03/07/2021   Orthopnea 02/25/2021   History of cerebrovascular accident (CVA) with residual deficit 05/06/2020   AKI (acute kidney injury) (HCC) 04/20/2020   Generalized weakness 04/20/2020   Dehydration 04/20/2020    Generalized abdominal pain    Pancreatitis, acute 02/29/2020   Cerebrovascular accident (CVA) (HCC) 11/08/2019   Stenosis of right carotid artery 11/08/2019   Cortical age-related cataract of both eyes 08/30/2019   Gait abnormality 08/30/2019   Statin declined 08/30/2019   Gastroesophageal reflux disease without esophagitis 04/18/2018   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   Insulin dependent type 2 diabetes mellitus (HCC) 10/29/2017   History of macular degeneration 10/29/2017   Chronic cervical pain 10/29/2016   Myalgia 09/14/2016   Insomnia 09/14/2016   Tinea pedis of both feet 08/20/2016   Macular degeneration 08/17/2016   Low back pain 09/21/2014   Hypertensive urgency 09/21/2014   HTN (hypertension) 09/08/2014   Thyroid nodule 08/17/2014   Morbid obesity (HCC) 08/17/2014   Hypokalemia 08/06/2014   Type II diabetes mellitus with neurological manifestations, uncontrolled 08/04/2014    Current Outpatient Medications:    acetaminophen (TYLENOL) 500 MG tablet, Take 1,000 mg by mouth every 8 (eight) hours as needed for mild pain (pain score 1-3), moderate pain (pain score 4-6) or headache., Disp: , Rfl:    albuterol (VENTOLIN HFA) 108 (90 Base) MCG/ACT inhaler, Inhale 2 puffs into the lungs every 6 (six) hours as needed., Disp: 18 g, Rfl: 3   ALPRAZolam (XANAX) 0.25 MG tablet, Take 1 tablet (0.25 mg total) by mouth on Monday, Wednesday and Friday at Hemodialysis., Disp: 12 tablet, Rfl: 2   aspirin 81 MG EC tablet, Take 1 tablet (81 mg total) by mouth daily., Disp: 60 tablet, Rfl: 1   atorvastatin (LIPITOR) 40 MG tablet, Take 1 tablet (40 mg total) by mouth daily for cholesterol., Disp: 90 tablet, Rfl: 1   ezetimibe (ZETIA) 10 MG tablet, Take 1 tablet (10 mg total) by mouth daily for cholesterol., Disp: 90 tablet, Rfl: 1   insulin glargine (LANTUS SOLOSTAR) 100 UNIT/ML Solostar Pen, Inject 3 Units into the skin 2 (two) times daily., Disp: 15 mL, Rfl: PRN   Insulin Pen Needle (PEN NEEDLES) 31G X 8  MM MISC, Use for injection with insulin pen daily., Disp: 100 each, Rfl: 6   isosorbide mononitrate (IMDUR) 30 MG 24 hr tablet, Take 2 tablets (60 mg total) by mouth daily for blood pressure., Disp: 180 tablet, Rfl: 1   labetalol (NORMODYNE) 300 MG tablet, Take 1 tablet (300 mg total) by mouth 2 (two) times daily., Disp: 180 tablet, Rfl: 1   levothyroxine (SYNTHROID) 25 MCG tablet, Take 1 tablet (25 mcg total) by mouth daily for low thyroid hormone., Disp: 90 tablet, Rfl: 1   omeprazole (PRILOSEC) 20 MG capsule, Take 2 capsules (40 mg total) by mouth daily for acid reflux., Disp: 180 capsule, Rfl: 1   ondansetron (ZOFRAN) 8 MG tablet, Take 1 tablet (8 mg total) by mouth every 8 (eight) hours as needed for nausea, Disp: 42 tablet, Rfl: 0   polyethylene glycol (MIRALAX / GLYCOLAX) 17 g packet, Give 17 gram by mouth one time a day for constipation, Disp: 14 packet, Rfl: 0   sevelamer carbonate (RENVELA) 800 MG tablet, Take 3 tablets (2,400 mg total) by mouth 3 (three) times daily with meals and 1 tablet  with snacks., Disp: 1080 tablet, Rfl: 3   Accu-Chek Softclix Lancets lancets, Use to check blood sugar 3 times daily. (Patient not taking: Reported on 03/10/2024), Disp: 100 each, Rfl: 6   amLODipine (NORVASC) 10 MG tablet, Take 1 tablet (10 mg total) by mouth daily for hypertension (Patient not taking: Reported on 03/17/2024), Disp: 90 tablet, Rfl: 1   Blood Glucose Monitoring Suppl (ACCU-CHEK GUIDE) w/Device KIT, Use to check blood sugar 3 times daily. (Patient not taking: Reported on 02/25/2024), Disp: 1 kit, Rfl: 0   Continuous Glucose Receiver (FREESTYLE LIBRE 3 READER) DEVI, Use to check blood glucose continuously. Dx code: E11.59, Z79.4 (Patient not taking: Reported on 03/17/2024), Disp: 1 each, Rfl: 0   Continuous Glucose Sensor (FREESTYLE LIBRE 3 PLUS SENSOR) MISC, Change sensor every 15 days. E11.59, Z79.4 (Patient not taking: Reported on 03/17/2024), Disp: 2 each, Rfl: 11   glucose blood (ACCU-CHEK  GUIDE) test strip, Use to check blood sugar 3 times daily. (Patient not taking: Reported on 02/25/2024), Disp: 100 each, Rfl: 6   Multiple Vitamin (MULTI-VITAMIN DAILY) TABS, Take 1 tablet by mouth at bedtime for supplementation (Patient not taking: Reported on 02/04/2024), Disp: 14 tablet, Rfl: 0   Zinc 220 (50 Zn) MG CAPS, Take 1 capsule by mouth daily for minerals, electrolytes (Patient not taking: Reported on 02/04/2024), Disp: 14 capsule, Rfl: 0 Allergies  Allergen Reactions   Hydralazine Other (See Comments)   Hydralazine Hcl Other (See Comments)    Hair loss   Hydrocodone Itching and Other (See Comments)    Upset stomach   Metformin And Related Nausea And Vomiting and Other (See Comments)    Stomach pains, also   Other Nausea Only and Other (See Comments)    Lettuce- Does not digest this!!   Plaquenil [Hydroxychloroquine Sulfate] Hives   Shellfish Allergy Nausea And Vomiting   Shellfish-Derived Products Nausea Only and Other (See Comments)    Caused an upset stomach   Shrimp (Diagnostic) Nausea Only and Other (See Comments)    Upset stomach    Sulfa Antibiotics Hives   Sulfur Hives     Social History   Socioeconomic History   Marital status: Legally Separated    Spouse name: [name]   Number of children: 1   Years of education: some colle   Highest education level: Not on file  Occupational History   Occupation: Employed FT as Warden/ranger    Comment: [employer] , NA  Tobacco Use   Smoking status: Former    Current packs/day: 0.25    Average packs/day: 0.3 packs/day for 0.5 years (0.1 ttl pk-yrs)    Types: Cigarettes    Passive exposure: Never   Smokeless tobacco: Never   Tobacco comments:    Former smoker 11/03/21  Vaping Use   Vaping status: Never Used  Substance and Sexual Activity   Alcohol use: No   Drug use: No   Sexual activity: Not Currently    Birth control/protection: None  Other Topics Concern   Not on file  Social History Narrative   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    Social Drivers of Health   Financial Resource Strain: Low Risk  (02/04/2024)   Overall Financial Resource Strain (CARDIA)    Difficulty of Paying Living Expenses: Not hard at all  Food Insecurity: No Food Insecurity (02/04/2024)   Hunger Vital Sign    Worried About Running Out of Food in the Last Year: Never true  Ran Out of Food in the Last Year: Never true  Transportation Needs: No Transportation Needs (02/04/2024)   PRAPARE - Administrator, Civil Service (Medical): No    Lack of Transportation (Non-Medical): No  Physical Activity: Inactive (02/04/2024)   Exercise Vital Sign    Days of Exercise per Week: 0 days    Minutes of Exercise per Session: 0 min  Stress: No Stress Concern Present (02/04/2024)   [employer] of Occupational Health - Occupational Stress Questionnaire    Feeling of Stress : Not at all  Social Connections: Moderately Integrated (02/04/2024)   Social Connection and Isolation Panel [NHANES]    Frequency of Communication with Friends and Family: Twice a week    Frequency of Social Gatherings with Friends and Family: Once a week    Attends Religious Services: Never    Database administrator or Organizations: Yes    Attends Banker Meetings: Never    Marital Status: Married  Catering manager Violence: Not At Risk (02/04/2024)   Humiliation, Afraid, Rape, and Kick questionnaire    Fear of Current or Ex-Partner: No    Emotionally Abused: No    Physically Abused: No    Sexually Abused: No    Physical Exam      Future Appointments  Date Time Provider Department Center  03/19/2024 11:00 AM [facility]  06/04/2024 11:10 AM Marcine Matar, MD [facility] None

## 2024-03-19 ENCOUNTER — Encounter: Payer: Self-pay | Admitting: Physician Assistant

## 2024-03-19 ENCOUNTER — Ambulatory Visit (INDEPENDENT_AMBULATORY_CARE_PROVIDER_SITE_OTHER): Payer: Medicare Other | Admitting: Physician Assistant

## 2024-03-19 VITALS — BP 156/88 | HR 82 | Temp 98.3°F | Ht 63.0 in | Wt 226.0 lb

## 2024-03-19 DIAGNOSIS — N186 End stage renal disease: Secondary | ICD-10-CM

## 2024-03-19 DIAGNOSIS — Z992 Dependence on renal dialysis: Secondary | ICD-10-CM

## 2024-03-19 NOTE — Progress Notes (Signed)
 POST OPERATIVE OFFICE NOTE    CC:  F/u for surgery  HPI:  Nancy [name] is a 63 y.o. female who is s/p creation of left upper arm radiocephalic AV fistula on 12/24/2023 by Dr. Karin Lieu.  This was done for permanent dialysis access.  She has a history of multiple failed dialysis access attempts in the right upper extremity.  Pt returns today for follow up.  Pt states she is felt fine since surgery.  She denies any issues with her left upper arm incision such as tenderness, dehiscence, or drainage.  She denies any fevers or chills.  She denies any symptoms of steal in the left upper extremity such as hand weakness, numbness, excessive coldness, or pain.  She dialyzes on MWF via [facility] at [facility] on [facility].   Allergies  Allergen Reactions   Hydralazine Other (See Comments)   Hydralazine Hcl Other (See Comments)    Hair loss   Hydrocodone Itching and Other (See Comments)    Upset stomach   Metformin And Related Nausea And Vomiting and Other (See Comments)    Stomach pains, also   Other Nausea Only and Other (See Comments)    Lettuce- Does not digest this!!   Plaquenil [Hydroxychloroquine Sulfate] Hives   Shellfish Allergy Nausea And Vomiting   Shellfish-Derived Products Nausea Only and Other (See Comments)    Caused an upset stomach   Shrimp (Diagnostic) Nausea Only and Other (See Comments)    Upset stomach    Sulfa Antibiotics Hives   Sulfur Hives    Current Outpatient Medications  Medication Sig Dispense Refill   Accu-Chek Softclix Lancets lancets Use to check blood sugar 3 times daily. 100 each 6   acetaminophen (TYLENOL) 500 MG tablet Take 1,000 mg by mouth every 8 (eight) hours as needed for mild pain (pain score 1-3), moderate pain (pain score 4-6) or headache.     albuterol (VENTOLIN HFA) 108 (90 Base) MCG/ACT inhaler Inhale 2 puffs into the lungs every 6 (six) hours as needed. 18 g 3   ALPRAZolam (XANAX) 0.25 MG tablet Take 1 tablet (0.25 mg total) by mouth on Monday,  Wednesday and Friday at Hemodialysis. 12 tablet 2   amLODipine (NORVASC) 10 MG tablet Take 1 tablet (10 mg total) by mouth daily for hypertension 90 tablet 1   aspirin 81 MG EC tablet Take 1 tablet (81 mg total) by mouth daily. 60 tablet 1   atorvastatin (LIPITOR) 40 MG tablet Take 1 tablet (40 mg total) by mouth daily for cholesterol. 90 tablet 1   Blood Glucose Monitoring Suppl (ACCU-CHEK GUIDE) w/Device KIT Use to check blood sugar 3 times daily. 1 kit 0   Continuous Glucose Receiver (FREESTYLE LIBRE 3 READER) DEVI Use to check blood glucose continuously. Dx code: E11.59, Z79.4 1 each 0   Continuous Glucose Sensor (FREESTYLE LIBRE 3 PLUS SENSOR) MISC Change sensor every 15 days. E11.59, Z79.4 2 each 11   ezetimibe (ZETIA) 10 MG tablet Take 1 tablet (10 mg total) by mouth daily for cholesterol. 90 tablet 1   glucose blood (ACCU-CHEK GUIDE) test strip Use to check blood sugar 3 times daily. 100 each 6   insulin glargine (LANTUS SOLOSTAR) 100 UNIT/ML Solostar Pen Inject 3 Units into the skin 2 (two) times daily. 15 mL PRN   Insulin Pen Needle (PEN NEEDLES) 31G X 8 MM MISC Use for injection with insulin pen daily. 100 each 6   isosorbide mononitrate (IMDUR) 30 MG 24 hr tablet Take 2 tablets (60 mg  total) by mouth daily for blood pressure. 180 tablet 1   labetalol (NORMODYNE) 300 MG tablet Take 1 tablet (300 mg total) by mouth 2 (two) times daily. 180 tablet 1   levothyroxine (SYNTHROID) 25 MCG tablet Take 1 tablet (25 mcg total) by mouth daily for low thyroid hormone. 90 tablet 1   Multiple Vitamin (MULTI-VITAMIN DAILY) TABS Take 1 tablet by mouth at bedtime for supplementation 14 tablet 0   omeprazole (PRILOSEC) 20 MG capsule Take 2 capsules (40 mg total) by mouth daily for acid reflux. 180 capsule 1   ondansetron (ZOFRAN) 8 MG tablet Take 1 tablet (8 mg total) by mouth every 8 (eight) hours as needed for nausea 42 tablet 0   polyethylene glycol (MIRALAX / GLYCOLAX) 17 g packet Give 17 gram by mouth  one time a day for constipation 14 packet 0   sevelamer carbonate (RENVELA) 800 MG tablet Take 3 tablets (2,400 mg total) by mouth 3 (three) times daily with meals and 1 tablet with snacks. 1080 tablet 3   Zinc 220 (50 Zn) MG CAPS Take 1 capsule by mouth daily for minerals, electrolytes 14 capsule 0   No current facility-administered medications for this visit.     ROS:  See HPI  Physical Exam:   Incision:  well healed left upper arm incision without dehiscence or drainage Extremities:  palpable left radial pulse. Pulsatile thrill in fistula at arterial anastomosis site. No thrill or bruit within fistula past the anastomosis Neuro: intact motor and sensation of LUE  Dialysis Duplex: (02/18/2024) AVF                 PSV (cm/s)Flow Vol (mL/min)Comments  +--------------------+----------+-----------------+--------+  Native artery inflow   125           165                 +--------------------+----------+-----------------+--------+  AVF Anastomosis        156                               +--------------------+----------+-----------------+--------+     +------------+----------+-------------+----------+-------------------------  ----+  OUTFLOW VEINPSV (cm/s)Diameter (cm)Depth (cm)          Describe              +------------+----------+-------------+----------+-------------------------  ----+  Shoulder       31        0.33        1.57                                   +------------+----------+-------------+----------+-------------------------  ----+  Prox UA         25        0.20        0.76    competing branch  measuring                                                            0.11cm               +------------+----------+-------------+----------+-------------------------  ----+  Mid UA          17        0.40  0.73                                   +------------+----------+-------------+----------+-------------------------   ----+  Dist UA        582        0.25        0.85    competing branch  measuring                                                 0.32cm and change in  Diameter  +------------+----------+-------------+----------+-------------------------  ----+  AC Fossa       184        0.64        0.25                                   +------------+----------+-------------+----------+-------------------------  ----+      Assessment/Plan:  This is a 63 y.o. female who is here for a post op visit  -The patient recently underwent creation of a left upper arm radiocephalic fistula for permanent dialysis access.  She has a history of multiple failed previous accesses in the right upper extremity -Duplex from 1 month ago demonstrates a poorly maturing fistula with weak flow volume of 165 mL/min.  The fistula also was not maturing in diameter and had several competing branches.  -On exam the patient's left arm incision has completely healed.  She has a palpable left radial pulse.  Her fistula has a pulsatile thrill at the arterial anastomosis site.  The remainder of the fistula does not have a thrill or bruit, suggestive of occlusion. -I have explained to the patient unfortunately that her fistula appears occluded.  This could have been due to competitive branches off the fistula versus a disease vein.  Her fistula is not a candidate for thrombectomy, given that it never matured for use and we do not know when it occluded. -I have explained to the patient that we would have to pursue dialysis access surgery again, likely in the left upper extremity, if she does not want to be catheter dependent.  At this time she does not want to pursue any more surgeries.  She would like to be catheter dependent for several months and then reevaluate her options with Dr. Karin Lieu -At this time she can continue dialysis through her [facility].  We can schedule her for follow-up with Dr. Karin Lieu in 6 months with repeat vein  mapping   Loel Dubonnet, PA-C [facility] [phone]   Clinic MD:  Karin Lieu

## 2024-03-20 ENCOUNTER — Other Ambulatory Visit: Payer: Self-pay | Admitting: *Deleted

## 2024-03-20 DIAGNOSIS — Z992 Dependence on renal dialysis: Secondary | ICD-10-CM

## 2024-03-24 ENCOUNTER — Telehealth (HOSPITAL_COMMUNITY): Payer: Self-pay | Admitting: Emergency Medicine

## 2024-03-24 ENCOUNTER — Telehealth: Payer: Self-pay | Admitting: Pharmacist

## 2024-03-24 ENCOUNTER — Other Ambulatory Visit: Payer: Self-pay

## 2024-03-24 ENCOUNTER — Telehealth: Payer: Self-pay | Admitting: Internal Medicine

## 2024-03-24 ENCOUNTER — Other Ambulatory Visit (HOSPITAL_COMMUNITY): Payer: Self-pay | Admitting: Emergency Medicine

## 2024-03-24 DIAGNOSIS — K5900 Constipation, unspecified: Secondary | ICD-10-CM

## 2024-03-24 NOTE — Progress Notes (Unsigned)
 Paramedicine Encounter    Patient ID: Nancy [name], female    DOB: 04-Mar-1961, 63 y.o.   MRN: [id number]   Complaints Stomach ache, neck and arm cramps- come and stay hours at a time then the cramping go away. No bowel movement in 3 days.  Assessment A&O x 4, skin W&D w/ good color.  Denies chest pain or SOB.  Lung sounds clear bilat. No peripheral edema noted.  Compliance with meds YES  Pill box filled x 1 week  Refills needed Xanax  Meds changes since last visit NONE    Social changes None   BP (!) 220/90 (BP Location: Right Arm, Patient Position: Supine, Cuff Size: Normal)   Pulse 81   Resp 18   SpO2 100%  Weight yesterday- not taken Last visit weight-226lb  ATF Mrs [employer] in bed.  She is normally`1\\\\\ ACTION: Home visit completed  Bethanie Dicker [phone] 03/24/24  Patient Care Team: Marcine Matar, MD as PCP - General (Internal Medicine) Runell Gess, MD as PCP - Cardiology (Cardiology) Laurey Morale, MD as PCP - Advanced Heart Failure (Cardiology) Center, [facility]  Patient Active Problem List   Diagnosis Date Noted   S/P bilateral BKA (below knee amputation) (HCC) 02/04/2024   History of CVA (cerebrovascular accident) 03/22/2023   Community acquired pneumonia 03/22/2023   Pain, unspecified 08/15/2022   Complication of vascular dialysis catheter 08/07/2022   Chest pain 05/30/2022   Fluid overload 05/30/2022   Iron deficiency anemia, unspecified 04/26/2022   Nausea 04/10/2022   Hyponatremia 04/04/2022   Prolonged QT interval 04/04/2022   Anaphylactic shock, unspecified, initial encounter 02/23/2022   Other allergy, initial encounter 02/23/2022   Pressure injury of skin 02/19/2022   Anemia in chronic kidney disease 02/16/2022   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) 02/16/2022   Hyperlipidemia, unspecified 02/16/2022   Hypertensive heart and chronic kidney disease with heart failure and with stage 5  chronic kidney disease, or end stage renal disease (HCC) 02/16/2022   Personal history of nicotine dependence 02/16/2022   Personal history of transient ischemic attack (TIA), and cerebral infarction without residual deficits 02/16/2022   Secondary hyperparathyroidism of renal origin (HCC) 02/16/2022   Renal anasarca 02/14/2022   ESRD on dialysis (HCC) 02/14/2022   Hyperkalemia 02/14/2022   S/P arteriovenous (AV) fistula creation 12/22/2021   Peripheral artery disease (HCC) 10/27/2021   Acute exacerbation of congestive heart failure (HCC) 10/24/2021   Acute cardiogenic pulmonary edema (HCC) 10/06/2021   CAD (coronary artery disease) 10/06/2021   Goals of care, counseling/discussion    Gangrene of left foot (HCC)    Cellulitis of left foot    CHF (congestive heart failure) (HCC) 07/07/2021   Acute postoperative anemia due to expected blood loss superimposed on anemia of chronic renal insufficiency 06/30/2021   CKD (chronic kidney disease), stage IV (HCC) 06/11/2021   Chronic diastolic CHF (congestive heart failure) (HCC) 06/10/2021   Abnormal nuclear stress test    Dyspnea on exertion 03/07/2021   Orthopnea 02/25/2021   History of cerebrovascular accident (CVA) with residual deficit 05/06/2020   AKI (acute kidney injury) (HCC) 04/20/2020   Generalized weakness 04/20/2020   Dehydration 04/20/2020   Generalized abdominal pain    Pancreatitis, acute 02/29/2020   Cerebrovascular accident (CVA) (HCC) 11/08/2019   Stenosis of right carotid artery 11/08/2019   Cortical age-related cataract of both eyes 08/30/2019   Gait abnormality 08/30/2019   Statin declined 08/30/2019   Gastroesophageal reflux disease without esophagitis 04/18/2018  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   Insulin dependent type 2 diabetes mellitus (HCC) 10/29/2017   History of macular degeneration 10/29/2017   Chronic cervical pain 10/29/2016   Myalgia 09/14/2016   Insomnia 09/14/2016   Tinea pedis of both feet 08/20/2016    Macular degeneration 08/17/2016   Low back pain 09/21/2014   Hypertensive urgency 09/21/2014   HTN (hypertension) 09/08/2014   Thyroid nodule 08/17/2014   Morbid obesity (HCC) 08/17/2014   Hypokalemia 08/06/2014   Type II diabetes mellitus with neurological manifestations, uncontrolled 08/04/2014    Current Outpatient Medications:    acetaminophen (TYLENOL) 500 MG tablet, Take 1,000 mg by mouth every 8 (eight) hours as needed for mild pain (pain score 1-3), moderate pain (pain score 4-6) or headache., Disp: , Rfl:    albuterol (VENTOLIN HFA) 108 (90 Base) MCG/ACT inhaler, Inhale 2 puffs into the lungs every 6 (six) hours as needed., Disp: 18 g, Rfl: 3   ALPRAZolam (XANAX) 0.25 MG tablet, Take 1 tablet (0.25 mg total) by mouth on Monday, Wednesday and Friday at Hemodialysis., Disp: 12 tablet, Rfl: 2   amLODipine (NORVASC) 10 MG tablet, Take 1 tablet (10 mg total) by mouth daily for hypertension, Disp: 90 tablet, Rfl: 1   aspirin 81 MG EC tablet, Take 1 tablet (81 mg total) by mouth daily., Disp: 60 tablet, Rfl: 1   atorvastatin (LIPITOR) 40 MG tablet, Take 1 tablet (40 mg total) by mouth daily for cholesterol., Disp: 90 tablet, Rfl: 1   ezetimibe (ZETIA) 10 MG tablet, Take 1 tablet (10 mg total) by mouth daily for cholesterol., Disp: 90 tablet, Rfl: 1   insulin glargine (LANTUS SOLOSTAR) 100 UNIT/ML Solostar Pen, Inject 3 Units into the skin 2 (two) times daily., Disp: 15 mL, Rfl: PRN   Insulin Pen Needle (PEN NEEDLES) 31G X 8 MM MISC, Use for injection with insulin pen daily., Disp: 100 each, Rfl: 6   isosorbide mononitrate (IMDUR) 30 MG 24 hr tablet, Take 2 tablets (60 mg total) by mouth daily for blood pressure., Disp: 180 tablet, Rfl: 1   labetalol (NORMODYNE) 300 MG tablet, Take 1 tablet (300 mg total) by mouth 2 (two) times daily., Disp: 180 tablet, Rfl: 1   levothyroxine (SYNTHROID) 25 MCG tablet, Take 1 tablet (25 mcg total) by mouth daily for low thyroid hormone., Disp: 90 tablet, Rfl:  1   omeprazole (PRILOSEC) 20 MG capsule, Take 2 capsules (40 mg total) by mouth daily for acid reflux., Disp: 180 capsule, Rfl: 1   ondansetron (ZOFRAN) 8 MG tablet, Take 1 tablet (8 mg total) by mouth every 8 (eight) hours as needed for nausea, Disp: 42 tablet, Rfl: 0   sevelamer carbonate (RENVELA) 800 MG tablet, Take 3 tablets (2,400 mg total) by mouth 3 (three) times daily with meals and 1 tablet with snacks., Disp: 1080 tablet, Rfl: 3   Accu-Chek Softclix Lancets lancets, Use to check blood sugar 3 times daily. (Patient not taking: Reported on 03/24/2024), Disp: 100 each, Rfl: 6   Blood Glucose Monitoring Suppl (ACCU-CHEK GUIDE) w/Device KIT, Use to check blood sugar 3 times daily. (Patient not taking: Reported on 03/24/2024), Disp: 1 kit, Rfl: 0   Continuous Glucose Receiver (FREESTYLE LIBRE 3 READER) DEVI, Use to check blood glucose continuously. Dx code: E11.59, Z79.4 (Patient not taking: Reported on 03/24/2024), Disp: 1 each, Rfl: 0   Continuous Glucose Sensor (FREESTYLE LIBRE 3 PLUS SENSOR) MISC, Change sensor every 15 days. E11.59, Z79.4 (Patient not taking: Reported on 03/24/2024), Disp: 2 each, Rfl: 11  glucose blood (ACCU-CHEK GUIDE) test strip, Use to check blood sugar 3 times daily. (Patient not taking: Reported on 03/24/2024), Disp: 100 each, Rfl: 6   Multiple Vitamin (MULTI-VITAMIN DAILY) TABS, Take 1 tablet by mouth at bedtime for supplementation (Patient not taking: Reported on 03/24/2024), Disp: 14 tablet, Rfl: 0   polyethylene glycol (MIRALAX / GLYCOLAX) 17 g packet, Give 17 gram by mouth one time a day for constipation (Patient not taking: Reported on 03/24/2024), Disp: 14 packet, Rfl: 0   Zinc 220 (50 Zn) MG CAPS, Take 1 capsule by mouth daily for minerals, electrolytes (Patient not taking: Reported on 03/24/2024), Disp: 14 capsule, Rfl: 0 Allergies  Allergen Reactions   Hydralazine Other (See Comments)   Hydralazine Hcl Other (See Comments)    Hair loss   Hydrocodone Itching and Other (See  Comments)    Upset stomach   Metformin And Related Nausea And Vomiting and Other (See Comments)    Stomach pains, also   Other Nausea Only and Other (See Comments)    Lettuce- Does not digest this!!   Plaquenil [Hydroxychloroquine Sulfate] Hives   Shellfish Allergy Nausea And Vomiting   Shellfish-Derived Products Nausea Only and Other (See Comments)    Caused an upset stomach   Shrimp (Diagnostic) Nausea Only and Other (See Comments)    Upset stomach    Sulfa Antibiotics Hives   Sulfur Hives     Social History   Socioeconomic History   Marital status: Legally Separated    Spouse name: [name]   Number of children: 1   Years of education: some colle   Highest education level: Not on file  Occupational History   Occupation: Employed FT as Warden/ranger    Comment: [employer] , NA  Tobacco Use   Smoking status: Former    Current packs/day: 0.25    Average packs/day: 0.3 packs/day for 0.5 years (0.1 ttl pk-yrs)    Types: Cigarettes    Passive exposure: Never   Smokeless tobacco: Never   Tobacco comments:    Former smoker 11/03/21  Vaping Use   Vaping status: Never Used  Substance and Sexual Activity   Alcohol use: No   Drug use: No   Sexual activity: Not Currently    Birth control/protection: None  Other Topics Concern   Not on file  Social History Narrative   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    Social Drivers of Health   Financial Resource Strain: Low Risk  (02/04/2024)   Overall Financial Resource Strain (CARDIA)    Difficulty of Paying Living Expenses: Not hard at all  Food Insecurity: No Food Insecurity (02/04/2024)   Hunger Vital Sign    Worried About Running Out of Food in the Last Year: Never true    Ran Out of Food in the Last Year: Never true  Transportation Needs: No Transportation Needs (02/04/2024)   PRAPARE - Administrator, Civil Service (Medical): No    Lack of Transportation (Non-Medical): No   Physical Activity: Inactive (02/04/2024)   Exercise Vital Sign    Days of Exercise per Week: 0 days    Minutes of Exercise per Session: 0 min  Stress: No Stress Concern Present (02/04/2024)   [employer] of Occupational Health - Occupational Stress Questionnaire    Feeling of Stress : Not at all  Social Connections: Moderately Integrated (02/04/2024)   Social Connection and Isolation Panel [NHANES]    Frequency of Communication with Friends and Family:  Twice a week    Frequency of Social Gatherings with Friends and Family: Once a week    Attends Religious Services: Never    Database administrator or Organizations: Yes    Attends Banker Meetings: Never    Marital Status: Married  Catering manager Violence: Not At Risk (02/04/2024)   Humiliation, Afraid, Rape, and Kick questionnaire    Fear of Current or Ex-Partner: No    Emotionally Abused: No    Physically Abused: No    Sexually Abused: No    Physical Exam      Future Appointments  Date Time Provider Department Center  06/04/2024 11:10 AM Marcine Matar, MD [facility] None  09/17/2024 11:00 AM [facility]  09/17/2024 11:40 AM Victorino Sparrow, MD [facility]

## 2024-03-24 NOTE — Telephone Encounter (Signed)
  Copied from CRM [id number]. Topic: General - Other  >> Mar 24, 2024  3:35 PM Pierre Bali B wrote: Reason for CRM: patient called in stating she would need a GI. does she have to be referred or can she look for a specialist on her own.? Pt would like a call either way please [phone]

## 2024-03-24 NOTE — Telephone Encounter (Signed)
 Hey friend,   Will you let me know when you are able to fax those OV notes to the fax number I handed off to you today? No rush or time crunch, I just want to be able to let the patient know when we faxed what was needed to approve her CGM (libre supplies for blood sugar monitoring) supplies.

## 2024-03-24 NOTE — Telephone Encounter (Signed)
 During today's home visit, Mrs. Akre states she was not feeling well w/ a chief compliant of abdominal discomfort and feeling bloated.  She states she has had a lot of flatulence which does provide some relief.  She has not had a bowel movement in 3 day.  She is taking a stool softener w/o change.  She rates her abdominal discomfort 5/10 and intermittent in nature.  She denies chest pain or SOB.  She is hypertensive @ 220/90.  Denies chest pain or SOB.  No headache or visual disturbances.  No obvious neurological deficits.  She refuses to go to the hospital.  I discussed w/ her the dangers of uncontrolled hypertension.  I encouraged her to call 911 should she change her mind or require further assistance.  She has dialysis tomorrow.     Beatrix Shipper, EMT-Paramedic [phone] 03/24/2024

## 2024-03-25 ENCOUNTER — Other Ambulatory Visit (HOSPITAL_COMMUNITY): Payer: Self-pay

## 2024-03-25 MED ORDER — POLYETHYLENE GLYCOL 3350 17 GM/SCOOP PO POWD
17.0000 g | Freq: Every day | ORAL | 1 refills | Status: DC | PRN
  Filled 2024-03-25: qty 510, 30d supply, fill #0

## 2024-03-25 NOTE — Telephone Encounter (Signed)
 Patient called this morning requesting referral to gastroenterology.  Referral submitted. -Advised use of MiraLAX to help with constipation.  Prescription sent to her pharmacy. -If abdominal pain persists, she should be seen in the emergency room. -Blood pressure reported as elevated.  Please indicate whether patient had taken her blood pressure medications including labetalol, isosorbide and amlodipine already for the morning at the time that the blood pressure was checked.

## 2024-03-25 NOTE — Addendum Note (Signed)
 Addended by: Jonah Blue B on: 03/25/2024 11:57 AM   Modules accepted: Orders

## 2024-03-26 ENCOUNTER — Other Ambulatory Visit (HOSPITAL_COMMUNITY): Payer: Self-pay

## 2024-03-26 NOTE — Telephone Encounter (Signed)
 Called & spoke to the patient. Informed of patient of gastroenterology being submitted. Informed of all recommendations. Patient agreed to go to the ER if abdominal pain persists. Patient also confirmed that she did take all BP medications at the time the blood pressure was checked. She stated that she was in pain at that time.

## 2024-03-31 ENCOUNTER — Other Ambulatory Visit (HOSPITAL_COMMUNITY): Payer: Self-pay | Admitting: Emergency Medicine

## 2024-03-31 NOTE — Progress Notes (Signed)
 Paramedicine Encounter    Patient ID: Nancy [name], female    DOB: 09-Aug-1961, 63 y.o.   MRN: [id number]   Complaints NONE (pre-existing abdominal issues, pending seeing GI dr.)  Assessment A&O x 4, skin W&D w/ good color.  Denies chest pain or SOB. Lung sounds clear.  No edema noted  Compliance with meds YES  Pill box filled x 1 week  Refills needed NONE  Meds changes since last visit NONE    Social changes NONE   BP 130/80   Pulse 82   Resp 18   SpO2 97%  Weight yesterday-234lb @ dialysis Last visit weight- not taken  ATF Ms. Nusser sitting up in her hospital bed which is located in the living room of her home.  She continues to have issues w/ her stomach and is awaiting an appointment w/ gastroenterologist. Unable to weigh due to being in bed and can't get her up w/o her husband to help me as he has gone to work.  Pt advises that she wishes to possibly get some in home help w/ ADL's.  I will send Robyne Peers message regarding same. Also, we are still waiting on pt's Freestyle Libre to be approved.  Pt currently not checking her CBG even though I have provided her w/ a new glucometer.  ACTION: Home visit completed  Bethanie Dicker [phone] 03/31/24  Patient Care Team: Marcine Matar, MD as PCP - General (Internal Medicine) Runell Gess, MD as PCP - Cardiology (Cardiology) Laurey Morale, MD as PCP - Advanced Heart Failure (Cardiology) Center, [facility]  Patient Active Problem List   Diagnosis Date Noted   S/P bilateral BKA (below knee amputation) (HCC) 02/04/2024   History of CVA (cerebrovascular accident) 03/22/2023   Community acquired pneumonia 03/22/2023   Pain, unspecified 08/15/2022   Complication of vascular dialysis catheter 08/07/2022   Chest pain 05/30/2022   Fluid overload 05/30/2022   Iron deficiency anemia, unspecified 04/26/2022   Nausea 04/10/2022   Hyponatremia 04/04/2022   Prolonged QT interval  04/04/2022   Anaphylactic shock, unspecified, initial encounter 02/23/2022   Other allergy, initial encounter 02/23/2022   Pressure injury of skin 02/19/2022   Anemia in chronic kidney disease 02/16/2022   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) 02/16/2022   Hyperlipidemia, unspecified 02/16/2022   Hypertensive heart and chronic kidney disease with heart failure and with stage 5 chronic kidney disease, or end stage renal disease (HCC) 02/16/2022   Personal history of nicotine dependence 02/16/2022   Personal history of transient ischemic attack (TIA), and cerebral infarction without residual deficits 02/16/2022   Secondary hyperparathyroidism of renal origin (HCC) 02/16/2022   Renal anasarca 02/14/2022   ESRD on dialysis (HCC) 02/14/2022   Hyperkalemia 02/14/2022   S/P arteriovenous (AV) fistula creation 12/22/2021   Peripheral artery disease (HCC) 10/27/2021   Acute exacerbation of congestive heart failure (HCC) 10/24/2021   Acute cardiogenic pulmonary edema (HCC) 10/06/2021   CAD (coronary artery disease) 10/06/2021   Goals of care, counseling/discussion    Gangrene of left foot (HCC)    Cellulitis of left foot    CHF (congestive heart failure) (HCC) 07/07/2021   Acute postoperative anemia due to expected blood loss superimposed on anemia of chronic renal insufficiency 06/30/2021   CKD (chronic kidney disease), stage IV (HCC) 06/11/2021   Chronic diastolic CHF (congestive heart failure) (HCC) 06/10/2021   Abnormal nuclear stress test    Dyspnea on exertion 03/07/2021   Orthopnea 02/25/2021   History of cerebrovascular accident (  CVA) with residual deficit 05/06/2020   AKI (acute kidney injury) (HCC) 04/20/2020   Generalized weakness 04/20/2020   Dehydration 04/20/2020   Generalized abdominal pain    Pancreatitis, acute 02/29/2020   Cerebrovascular accident (CVA) (HCC) 11/08/2019   Stenosis of right carotid artery 11/08/2019   Cortical age-related cataract of both eyes 08/30/2019    Gait abnormality 08/30/2019   Statin declined 08/30/2019   Gastroesophageal reflux disease without esophagitis 04/18/2018   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   Insulin dependent type 2 diabetes mellitus (HCC) 10/29/2017   History of macular degeneration 10/29/2017   Chronic cervical pain 10/29/2016   Myalgia 09/14/2016   Insomnia 09/14/2016   Tinea pedis of both feet 08/20/2016   Macular degeneration 08/17/2016   Low back pain 09/21/2014   Hypertensive urgency 09/21/2014   HTN (hypertension) 09/08/2014   Thyroid nodule 08/17/2014   Morbid obesity (HCC) 08/17/2014   Hypokalemia 08/06/2014   Type II diabetes mellitus with neurological manifestations, uncontrolled 08/04/2014    Current Outpatient Medications:    acetaminophen (TYLENOL) 500 MG tablet, Take 1,000 mg by mouth every 8 (eight) hours as needed for mild pain (pain score 1-3), moderate pain (pain score 4-6) or headache., Disp: , Rfl:    albuterol (VENTOLIN HFA) 108 (90 Base) MCG/ACT inhaler, Inhale 2 puffs into the lungs every 6 (six) hours as needed., Disp: 18 g, Rfl: 3   ALPRAZolam (XANAX) 0.25 MG tablet, Take 1 tablet (0.25 mg total) by mouth on Monday, Wednesday and Friday at Hemodialysis., Disp: 12 tablet, Rfl: 2   amLODipine (NORVASC) 10 MG tablet, Take 1 tablet (10 mg total) by mouth daily for hypertension, Disp: 90 tablet, Rfl: 1   aspirin 81 MG EC tablet, Take 1 tablet (81 mg total) by mouth daily., Disp: 60 tablet, Rfl: 1   atorvastatin (LIPITOR) 40 MG tablet, Take 1 tablet (40 mg total) by mouth daily for cholesterol., Disp: 90 tablet, Rfl: 1   ezetimibe (ZETIA) 10 MG tablet, Take 1 tablet (10 mg total) by mouth daily for cholesterol., Disp: 90 tablet, Rfl: 1   insulin glargine (LANTUS SOLOSTAR) 100 UNIT/ML Solostar Pen, Inject 3 Units into the skin 2 (two) times daily., Disp: 15 mL, Rfl: PRN   Insulin Pen Needle (PEN NEEDLES) 31G X 8 MM MISC, Use for injection with insulin pen daily., Disp: 100 each, Rfl: 6   isosorbide  mononitrate (IMDUR) 30 MG 24 hr tablet, Take 2 tablets (60 mg total) by mouth daily for blood pressure., Disp: 180 tablet, Rfl: 1   labetalol (NORMODYNE) 300 MG tablet, Take 1 tablet (300 mg total) by mouth 2 (two) times daily., Disp: 180 tablet, Rfl: 1   levothyroxine (SYNTHROID) 25 MCG tablet, Take 1 tablet (25 mcg total) by mouth daily for low thyroid hormone., Disp: 90 tablet, Rfl: 1   omeprazole (PRILOSEC) 20 MG capsule, Take 2 capsules (40 mg total) by mouth daily for acid reflux., Disp: 180 capsule, Rfl: 1   ondansetron (ZOFRAN) 8 MG tablet, Take 1 tablet (8 mg total) by mouth every 8 (eight) hours as needed for nausea, Disp: 42 tablet, Rfl: 0   polyethylene glycol powder (GLYCOLAX/MIRALAX) 17 GM/SCOOP powder, Mix 1 capful with 8 ounces of fluid and take by mouth daily as needed., Disp: 3350 g, Rfl: 1   sevelamer carbonate (RENVELA) 800 MG tablet, Take 3 tablets (2,400 mg total) by mouth 3 (three) times daily with meals and 1 tablet with snacks., Disp: 1080 tablet, Rfl: 3   Accu-Chek Softclix Lancets lancets, Use to  check blood sugar 3 times daily. (Patient not taking: Reported on 03/24/2024), Disp: 100 each, Rfl: 6   Blood Glucose Monitoring Suppl (ACCU-CHEK GUIDE) w/Device KIT, Use to check blood sugar 3 times daily. (Patient not taking: Reported on 03/24/2024), Disp: 1 kit, Rfl: 0   Continuous Glucose Receiver (FREESTYLE LIBRE 3 READER) DEVI, Use to check blood glucose continuously. Dx code: E11.59, Z79.4 (Patient not taking: Reported on 03/31/2024), Disp: 1 each, Rfl: 0   Continuous Glucose Sensor (FREESTYLE LIBRE 3 PLUS SENSOR) MISC, Change sensor every 15 days. E11.59, Z79.4 (Patient not taking: Reported on 03/31/2024), Disp: 2 each, Rfl: 11   glucose blood (ACCU-CHEK GUIDE) test strip, Use to check blood sugar 3 times daily. (Patient not taking: Reported on 03/24/2024), Disp: 100 each, Rfl: 6   Multiple Vitamin (MULTI-VITAMIN DAILY) TABS, Take 1 tablet by mouth at bedtime for supplementation  (Patient not taking: Reported on 03/24/2024), Disp: 14 tablet, Rfl: 0   Zinc 220 (50 Zn) MG CAPS, Take 1 capsule by mouth daily for minerals, electrolytes (Patient not taking: Reported on 03/24/2024), Disp: 14 capsule, Rfl: 0 Allergies  Allergen Reactions   Hydralazine Other (See Comments)   Hydralazine Hcl Other (See Comments)    Hair loss   Hydrocodone Itching and Other (See Comments)    Upset stomach   Metformin And Related Nausea And Vomiting and Other (See Comments)    Stomach pains, also   Other Nausea Only and Other (See Comments)    Lettuce- Does not digest this!!   Plaquenil [Hydroxychloroquine Sulfate] Hives   Shellfish Allergy Nausea And Vomiting   Shellfish-Derived Products Nausea Only and Other (See Comments)    Caused an upset stomach   Shrimp (Diagnostic) Nausea Only and Other (See Comments)    Upset stomach    Sulfa Antibiotics Hives   Sulfur Hives     Social History   Socioeconomic History   Marital status: Legally Separated    Spouse name: [name]   Number of children: 1   Years of education: some colle   Highest education level: Not on file  Occupational History   Occupation: Employed FT as Warden/ranger    Comment: [employer] , NA  Tobacco Use   Smoking status: Former    Current packs/day: 0.25    Average packs/day: 0.3 packs/day for 0.5 years (0.1 ttl pk-yrs)    Types: Cigarettes    Passive exposure: Never   Smokeless tobacco: Never   Tobacco comments:    Former smoker 11/03/21  Vaping Use   Vaping status: Never Used  Substance and Sexual Activity   Alcohol use: No   Drug use: No   Sexual activity: Not Currently    Birth control/protection: None  Other Topics Concern   Not on file  Social History Narrative   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    Social Drivers of Health   Financial Resource Strain: Low Risk  (02/04/2024)   Overall Financial Resource Strain (CARDIA)    Difficulty of Paying Living Expenses:  Not hard at all  Food Insecurity: No Food Insecurity (02/04/2024)   Hunger Vital Sign    Worried About Running Out of Food in the Last Year: Never true    Ran Out of Food in the Last Year: Never true  Transportation Needs: No Transportation Needs (02/04/2024)   PRAPARE - Administrator, Civil Service (Medical): No    Lack of Transportation (Non-Medical): No  Physical Activity: Inactive (02/04/2024)  Exercise Vital Sign    Days of Exercise per Week: 0 days    Minutes of Exercise per Session: 0 min  Stress: No Stress Concern Present (02/04/2024)   [employer] of Occupational Health - Occupational Stress Questionnaire    Feeling of Stress : Not at all  Social Connections: Moderately Integrated (02/04/2024)   Social Connection and Isolation Panel [NHANES]    Frequency of Communication with Friends and Family: Twice a week    Frequency of Social Gatherings with Friends and Family: Once a week    Attends Religious Services: Never    Database administrator or Organizations: Yes    Attends Banker Meetings: Never    Marital Status: Married  Catering manager Violence: Not At Risk (02/04/2024)   Humiliation, Afraid, Rape, and Kick questionnaire    Fear of Current or Ex-Partner: No    Emotionally Abused: No    Physically Abused: No    Sexually Abused: No    Physical Exam      Future Appointments  Date Time Provider Department Center  06/04/2024 11:10 AM Lawrance Presume, MD [facility] None  09/17/2024 11:00 AM [facility]  09/17/2024 11:40 AM Kayla Part, MD [facility]

## 2024-04-06 ENCOUNTER — Other Ambulatory Visit (HOSPITAL_COMMUNITY): Payer: Self-pay | Admitting: Emergency Medicine

## 2024-04-06 ENCOUNTER — Other Ambulatory Visit (HOSPITAL_COMMUNITY): Payer: Self-pay

## 2024-04-06 NOTE — Progress Notes (Signed)
 Paramedicine Encounter    Patient ID: Nancy [name], female    DOB: June 05, 1961, 63 y.o.   MRN: [id number]   Complaints NONE  Assessment A&O x 4, skin W&D w/ good color.  Denies chest pain or SOB.  Lung sounds clear and equal bilat  Compliance with meds YES  Pill box filled x 1 week  Refills needed NONE  Meds changes since last visit NONE   Social changes NONE   BP 110/80 (BP Location: Right Arm, Patient Position: Sitting, Cuff Size: Normal)   Pulse 86   Wt 233 lb (105.7 kg)   SpO2 95%   BMI 41.27 kg/m  CBG 205 Weight yesterday- not taken Last visit weight-  ATF Nancy [name] A&O x 4, sitting in her wheelchair at home.  She denies chest pain or SOB.  Lung sounds clear and equal bilat and no noted edema.  She has done well w/ her med compliance.  Pill box reconciled x 1 week.  She is still waiting on her [employer].  I will follow up w/ Romona Cobb Ausdall to see if he has any update.  Also, pt. States she is waiting to be referred to a GI doctor.  I will f/u w/ Dr. Anselmo Bast office regarding this referral.  ACTION: Home visit completed  Carlton Chick [phone] 04/06/24  Patient Care Team: Lawrance Presume, MD as PCP - General (Internal Medicine) Avanell Leigh, MD as PCP - Cardiology (Cardiology) Darlis Eisenmenger, MD as PCP - Advanced Heart Failure (Cardiology) Center, [facility]  Patient Active Problem List   Diagnosis Date Noted   S/P bilateral BKA (below knee amputation) (HCC) 02/04/2024   History of CVA (cerebrovascular accident) 03/22/2023   Community acquired pneumonia 03/22/2023   Pain, unspecified 08/15/2022   Complication of vascular dialysis catheter 08/07/2022   Chest pain 05/30/2022   Fluid overload 05/30/2022   Iron  deficiency anemia, unspecified 04/26/2022   Nausea 04/10/2022   Hyponatremia 04/04/2022   Prolonged QT interval 04/04/2022   Anaphylactic shock, unspecified, initial encounter 02/23/2022    Other allergy, initial encounter 02/23/2022   Pressure injury of skin 02/19/2022   Anemia in chronic kidney disease 02/16/2022   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) 02/16/2022   Hyperlipidemia, unspecified 02/16/2022   Hypertensive heart and chronic kidney disease with heart failure and with stage 5 chronic kidney disease, or end stage renal disease (HCC) 02/16/2022   Personal history of nicotine dependence 02/16/2022   Personal history of transient ischemic attack (TIA), and cerebral infarction without residual deficits 02/16/2022   Secondary hyperparathyroidism of renal origin (HCC) 02/16/2022   Renal anasarca 02/14/2022   ESRD on dialysis (HCC) 02/14/2022   Hyperkalemia 02/14/2022   S/P arteriovenous (AV) fistula creation 12/22/2021   Peripheral artery disease (HCC) 10/27/2021   Acute exacerbation of congestive heart failure (HCC) 10/24/2021   Acute cardiogenic pulmonary edema (HCC) 10/06/2021   CAD (coronary artery disease) 10/06/2021   Goals of care, counseling/discussion    Gangrene of left foot (HCC)    Cellulitis of left foot    CHF (congestive heart failure) (HCC) 07/07/2021   Acute postoperative anemia due to expected blood loss superimposed on anemia of chronic renal insufficiency 06/30/2021   CKD (chronic kidney disease), stage IV (HCC) 06/11/2021   Chronic diastolic CHF (congestive heart failure) (HCC) 06/10/2021   Abnormal nuclear stress test    Dyspnea on exertion 03/07/2021   Orthopnea 02/25/2021   History of cerebrovascular accident (CVA) with residual deficit 05/06/2020  AKI (acute kidney injury) (HCC) 04/20/2020   Generalized weakness 04/20/2020   Dehydration 04/20/2020   Generalized abdominal pain    Pancreatitis, acute 02/29/2020   Cerebrovascular accident (CVA) (HCC) 11/08/2019   Stenosis of right carotid artery 11/08/2019   Cortical age-related cataract of both eyes 08/30/2019   Gait abnormality 08/30/2019   Statin declined 08/30/2019   Gastroesophageal  reflux disease without esophagitis 04/18/2018   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   Insulin  dependent type 2 diabetes mellitus (HCC) 10/29/2017   History of macular degeneration 10/29/2017   Chronic cervical pain 10/29/2016   Myalgia 09/14/2016   Insomnia 09/14/2016   Tinea pedis of both feet 08/20/2016   Macular degeneration 08/17/2016   Low back pain 09/21/2014   Hypertensive urgency 09/21/2014   HTN (hypertension) 09/08/2014   Thyroid  nodule 08/17/2014   Morbid obesity (HCC) 08/17/2014   Hypokalemia 08/06/2014   Type II diabetes mellitus with neurological manifestations, uncontrolled 08/04/2014    Current Outpatient Medications:    acetaminophen  (TYLENOL ) 500 MG tablet, Take 1,000 mg by mouth every 8 (eight) hours as needed for mild pain (pain score 1-3), moderate pain (pain score 4-6) or headache., Disp: , Rfl:    albuterol  (VENTOLIN  HFA) 108 (90 Base) MCG/ACT inhaler, Inhale 2 puffs into the lungs every 6 (six) hours as needed., Disp: 18 g, Rfl: 3   ALPRAZolam  (XANAX ) 0.25 MG tablet, Take 1 tablet (0.25 mg total) by mouth on Monday, Wednesday and Friday at Hemodialysis., Disp: 12 tablet, Rfl: 2   amLODipine  (NORVASC ) 10 MG tablet, Take 1 tablet (10 mg total) by mouth daily for hypertension, Disp: 90 tablet, Rfl: 1   aspirin  81 MG EC tablet, Take 1 tablet (81 mg total) by mouth daily., Disp: 60 tablet, Rfl: 1   atorvastatin  (LIPITOR) 40 MG tablet, Take 1 tablet (40 mg total) by mouth daily for cholesterol., Disp: 90 tablet, Rfl: 1   ezetimibe  (ZETIA ) 10 MG tablet, Take 1 tablet (10 mg total) by mouth daily for cholesterol., Disp: 90 tablet, Rfl: 1   insulin  glargine (LANTUS  SOLOSTAR) 100 UNIT/ML Solostar Pen, Inject 3 Units into the skin 2 (two) times daily., Disp: 15 mL, Rfl: PRN   Insulin  Pen Needle (PEN NEEDLES) 31G X 8 MM MISC, Use for injection with insulin  pen daily., Disp: 100 each, Rfl: 6   isosorbide  mononitrate (IMDUR ) 30 MG 24 hr tablet, Take 2 tablets (60 mg total) by mouth daily  for blood pressure., Disp: 180 tablet, Rfl: 1   levothyroxine  (SYNTHROID ) 25 MCG tablet, Take 1 tablet (25 mcg total) by mouth daily for low thyroid  hormone., Disp: 90 tablet, Rfl: 1   omeprazole  (PRILOSEC) 20 MG capsule, Take 2 capsules (40 mg total) by mouth daily for acid reflux., Disp: 180 capsule, Rfl: 1   ondansetron  (ZOFRAN ) 8 MG tablet, Take 1 tablet (8 mg total) by mouth every 8 (eight) hours as needed for nausea, Disp: 42 tablet, Rfl: 0   polyethylene glycol powder (GLYCOLAX /MIRALAX ) 17 GM/SCOOP powder, Mix 1 capful with 8 ounces of fluid and take by mouth daily as needed., Disp: 3350 g, Rfl: 1   sevelamer  carbonate (RENVELA ) 800 MG tablet, Take 3 tablets (2,400 mg total) by mouth 3 (three) times daily with meals and 1 tablet with snacks., Disp: 1080 tablet, Rfl: 3   Accu-Chek Softclix Lancets lancets, Use to check blood sugar 3 times daily. (Patient not taking: Reported on 03/24/2024), Disp: 100 each, Rfl: 6   Blood Glucose Monitoring Suppl (ACCU-CHEK GUIDE) w/Device KIT, Use to check blood  sugar 3 times daily. (Patient not taking: Reported on 03/24/2024), Disp: 1 kit, Rfl: 0   Continuous Glucose Receiver (FREESTYLE LIBRE 3 READER) DEVI, Use to check blood glucose continuously. Dx code: E11.59, Z79.4 (Patient not taking: Reported on 03/24/2024), Disp: 1 each, Rfl: 0   Continuous Glucose Sensor (FREESTYLE LIBRE 3 PLUS SENSOR) MISC, Change sensor every 15 days. E11.59, Z79.4 (Patient not taking: Reported on 03/24/2024), Disp: 2 each, Rfl: 11   glucose blood (ACCU-CHEK GUIDE) test strip, Use to check blood sugar 3 times daily. (Patient not taking: Reported on 03/24/2024), Disp: 100 each, Rfl: 6   labetalol  (NORMODYNE ) 300 MG tablet, Take 1 tablet (300 mg total) by mouth 2 (two) times daily., Disp: 180 tablet, Rfl: 1   Multiple Vitamin (MULTI-VITAMIN DAILY) TABS, Take 1 tablet by mouth at bedtime for supplementation (Patient not taking: Reported on 03/24/2024), Disp: 14 tablet, Rfl: 0   Zinc  220 (50 Zn) MG  CAPS, Take 1 capsule by mouth daily for minerals, electrolytes (Patient not taking: Reported on 03/24/2024), Disp: 14 capsule, Rfl: 0 Allergies  Allergen Reactions   Hydralazine  Other (See Comments)   Hydralazine  Hcl Other (See Comments)    Hair loss   Hydrocodone Itching and Other (See Comments)    Upset stomach   Metformin  And Related Nausea And Vomiting and Other (See Comments)    Stomach pains, also   Other Nausea Only and Other (See Comments)    Lettuce- Does not digest this!!   Plaquenil [Hydroxychloroquine Sulfate] Hives   Shellfish Allergy Nausea And Vomiting   Shellfish-Derived Products Nausea Only and Other (See Comments)    Caused an upset stomach   Shrimp (Diagnostic) Nausea Only and Other (See Comments)    Upset stomach    Sulfa Antibiotics Hives   Sulfur  Hives     Social History   Socioeconomic History   Marital status: Legally Separated    Spouse name: Porfirio Bristol   Number of children: 1   Years of education: some colle   Highest education level: Not on file  Occupational History   Occupation: Employed FT as Warden/ranger    Comment: [employer] , NA  Tobacco Use   Smoking status: Former    Current packs/day: 0.25    Average packs/day: 0.3 packs/day for 0.5 years (0.1 ttl pk-yrs)    Types: Cigarettes    Passive exposure: Never   Smokeless tobacco: Never   Tobacco comments:    Former smoker 11/03/21  Vaping Use   Vaping status: Never Used  Substance and Sexual Activity   Alcohol use: No   Drug use: No   Sexual activity: Not Currently    Birth control/protection: None  Other Topics Concern   Not on file  Social History Narrative   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    Social Drivers of Health   Financial Resource Strain: Low Risk  (02/04/2024)   Overall Financial Resource Strain (CARDIA)    Difficulty of Paying Living Expenses: Not hard at all  Food Insecurity: No Food Insecurity (02/04/2024)   Hunger Vital Sign     Worried About Running Out of Food in the Last Year: Never true    Ran Out of Food in the Last Year: Never true  Transportation Needs: No Transportation Needs (02/04/2024)   PRAPARE - Administrator, Civil Service (Medical): No    Lack of Transportation (Non-Medical): No  Physical Activity: Inactive (02/04/2024)   Exercise Vital Sign  Days of Exercise per Week: 0 days    Minutes of Exercise per Session: 0 min  Stress: No Stress Concern Present (02/04/2024)   [employer] of Occupational Health - Occupational Stress Questionnaire    Feeling of Stress : Not at all  Social Connections: Moderately Integrated (02/04/2024)   Social Connection and Isolation Panel [NHANES]    Frequency of Communication with Friends and Family: Twice a week    Frequency of Social Gatherings with Friends and Family: Once a week    Attends Religious Services: Never    Database administrator or Organizations: Yes    Attends Banker Meetings: Never    Marital Status: Married  Catering manager Violence: Not At Risk (02/04/2024)   Humiliation, Afraid, Rape, and Kick questionnaire    Fear of Current or Ex-Partner: No    Emotionally Abused: No    Physically Abused: No    Sexually Abused: No    Physical Exam      Future Appointments  Date Time Provider Department Center  06/04/2024 11:10 AM Lawrance Presume, MD [facility] None  09/17/2024 11:00 AM [facility]  09/17/2024 11:40 AM Kayla Part, MD [facility]

## 2024-04-07 ENCOUNTER — Telehealth (HOSPITAL_COMMUNITY): Payer: Self-pay | Admitting: Emergency Medicine

## 2024-04-07 NOTE — Telephone Encounter (Signed)
 Called Nancy [name] w/ no answer.  Text her to  relay the information from Dr Anselmo Bast office regarding setting up appointment with [facility].  She can call [phone] and schedule an appointment at her convenience.    Ermalinda Hays, EMT-Paramedic [phone] 04/07/2024

## 2024-04-09 ENCOUNTER — Other Ambulatory Visit (HOSPITAL_COMMUNITY): Payer: Self-pay | Admitting: Emergency Medicine

## 2024-04-09 ENCOUNTER — Other Ambulatory Visit: Payer: Self-pay

## 2024-04-09 NOTE — Progress Notes (Signed)
 Med reconciliation only today.  No medication changes.    Ermalinda Hays, EMT-Paramedic [phone] 04/09/2024

## 2024-04-14 ENCOUNTER — Other Ambulatory Visit (HOSPITAL_COMMUNITY): Payer: Self-pay | Admitting: Emergency Medicine

## 2024-04-14 NOTE — Progress Notes (Unsigned)
 Paramedicine Encounter    Patient ID: Nancy [name], female    DOB: [birth year], 63 y.o.   MRN: [id number]   Complaints NONE  Assessment***  Compliance with meds YES  Pill box filled***  Refills needed amlodopine  Meds changes since last visit Freestyle Libre 3 applied for the first time.  Pt. Using reader app on her phone.    Social changes NONE   BP (!) 150/80 (BP Location: Right Arm, Patient Position: Sitting, Cuff Size: Normal)   Pulse 63   Wt 230 lb (104.3 kg)   SpO2 100%   BMI 40.74 kg/m  Weight yesterday-*** Last visit weight-***  ACTION: {Paramed Action:[phone]}  Ermalinda Hays, EMT-Paramedic [phone] 04/14/24  Patient Care Team: Lawrance Presume, MD as PCP - General (Internal Medicine) Avanell Leigh, MD as PCP - Cardiology (Cardiology) Darlis Eisenmenger, MD as PCP - Advanced Heart Failure (Cardiology) Center, [facility]  Patient Active Problem List   Diagnosis Date Noted  . S/P bilateral BKA (below knee amputation) (HCC) 02/04/2024  . History of CVA (cerebrovascular accident) 03/22/2023  . Community acquired pneumonia 03/22/2023  . Pain, unspecified 08/15/2022  . Complication of vascular dialysis catheter 08/07/2022  . Chest pain 05/30/2022  . Fluid overload 05/30/2022  . Iron  deficiency anemia, unspecified 04/26/2022  . Nausea 04/10/2022  . Hyponatremia 04/04/2022  . Prolonged QT interval 04/04/2022  . Anaphylactic shock, unspecified, initial encounter 02/23/2022  . Other allergy, initial encounter 02/23/2022  . Pressure injury of skin 02/19/2022  . Anemia in chronic kidney disease 02/16/2022  . 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 Hyperlipidemia, unspecified 02/16/2022  . Hypertensive heart and chronic kidney disease with heart failure and with stage 5 chronic kidney disease, or end stage renal disease (HCC) 02/16/2022  . Personal history of nicotine dependence 02/16/2022  . Personal history of transient  ischemic attack (TIA), and cerebral infarction without residual deficits 02/16/2022  . Secondary hyperparathyroidism of renal origin (HCC) 02/16/2022  . Renal anasarca 02/14/2022  . ESRD on dialysis (HCC) 02/14/2022  . Hyperkalemia 02/14/2022  . S/P arteriovenous (AV) fistula creation 12/22/2021  . Peripheral artery disease (HCC) 10/27/2021  . Acute exacerbation of congestive heart failure (HCC) 10/24/2021  . Acute cardiogenic pulmonary edema (HCC) 10/06/2021  . CAD (coronary artery disease) 10/06/2021  . Goals of care, counseling/discussion   . Gangrene of left foot (HCC)   . Cellulitis of left foot   . CHF (congestive heart failure) (HCC) 07/07/2021  . Acute postoperative anemia due to expected blood loss superimposed on anemia of chronic renal insufficiency 06/30/2021  . CKD (chronic kidney disease), stage IV (HCC) 06/11/2021  . Chronic diastolic CHF (congestive heart failure) (HCC) 06/10/2021  . Abnormal nuclear stress test   . Dyspnea on exertion 03/07/2021  . Orthopnea 02/25/2021  . History of cerebrovascular accident (CVA) with residual deficit 05/06/2020  . AKI (acute kidney injury) (HCC) 04/20/2020  . Generalized weakness 04/20/2020  . Dehydration 04/20/2020  . Generalized abdominal pain   . Pancreatitis, acute 02/29/2020  . Cerebrovascular accident (CVA) (HCC) 11/08/2019  . Stenosis of right carotid artery 11/08/2019  . Cortical age-related cataract of both eyes 08/30/2019  . Gait abnormality 08/30/2019  . Statin declined 08/30/2019  . Gastroesophageal reflux disease without esophagitis 04/18/2018  . 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 Insulin  dependent type 2 diabetes mellitus (HCC) 10/29/2017  . History of macular degeneration 10/29/2017  . Chronic cervical pain 10/29/2016  . Myalgia 09/14/2016  . Insomnia 09/14/2016  . Tinea pedis of  both feet 08/20/2016  . Macular degeneration 08/17/2016  . Low back pain 09/21/2014  . Hypertensive urgency 09/21/2014  . HTN  (hypertension) 09/08/2014  . Thyroid  nodule 08/17/2014  . Morbid obesity (HCC) 08/17/2014  . Hypokalemia 08/06/2014  . Type II diabetes mellitus with neurological manifestations, uncontrolled 08/04/2014    Current Outpatient Medications:  .  acetaminophen  (TYLENOL ) 500 MG tablet, Take 1,000 mg by mouth every 8 (eight) hours as needed for mild pain (pain score 1-3), moderate pain (pain score 4-6) or headache., Disp: , Rfl:  .  albuterol  (VENTOLIN  HFA) 108 (90 Base) MCG/ACT inhaler, Inhale 2 puffs into the lungs every 6 (six) hours as needed., Disp: 18 g, Rfl: 3 .  ALPRAZolam  (XANAX ) 0.25 MG tablet, Take 1 tablet (0.25 mg total) by mouth on Monday, Wednesday and Friday at Hemodialysis., Disp: 12 tablet, Rfl: 2 .  amLODipine  (NORVASC ) 10 MG tablet, Take 1 tablet (10 mg total) by mouth daily for hypertension, Disp: 90 tablet, Rfl: 1 .  aspirin  81 MG EC tablet, Take 1 tablet (81 mg total) by mouth daily., Disp: 60 tablet, Rfl: 1 .  atorvastatin  (LIPITOR) 40 MG tablet, Take 1 tablet (40 mg total) by mouth daily for cholesterol., Disp: 90 tablet, Rfl: 1 .  Continuous Glucose Receiver (FREESTYLE LIBRE 3 READER) DEVI, Use to check blood glucose continuously. Dx code: E11.59, Z79.4, Disp: 1 each, Rfl: 0 .  Continuous Glucose Sensor (FREESTYLE LIBRE 3 PLUS SENSOR) MISC, Change sensor every 15 days. E11.59, Z79.4, Disp: 2 each, Rfl: 11 .  ezetimibe  (ZETIA ) 10 MG tablet, Take 1 tablet (10 mg total) by mouth daily for cholesterol., Disp: 90 tablet, Rfl: 1 .  insulin  glargine (LANTUS  SOLOSTAR) 100 UNIT/ML Solostar Pen, Inject 3 Units into the skin 2 (two) times daily., Disp: 15 mL, Rfl: PRN .  Insulin  Pen Needle (PEN NEEDLES) 31G X 8 MM MISC, Use for injection with insulin  pen daily., Disp: 100 each, Rfl: 6 .  isosorbide  mononitrate (IMDUR ) 30 MG 24 hr tablet, Take 2 tablets (60 mg total) by mouth daily for blood pressure., Disp: 180 tablet, Rfl: 1 .  labetalol  (NORMODYNE ) 300 MG tablet, Take 1 tablet (300 mg  total) by mouth 2 (two) times daily., Disp: 180 tablet, Rfl: 1 .  levothyroxine  (SYNTHROID ) 25 MCG tablet, Take 1 tablet (25 mcg total) by mouth daily for low thyroid  hormone., Disp: 90 tablet, Rfl: 1 .  omeprazole  (PRILOSEC) 20 MG capsule, Take 2 capsules (40 mg total) by mouth daily for acid reflux., Disp: 180 capsule, Rfl: 1 .  ondansetron  (ZOFRAN ) 8 MG tablet, Take 1 tablet (8 mg total) by mouth every 8 (eight) hours as needed for nausea, Disp: 42 tablet, Rfl: 0 .  polyethylene glycol powder (GLYCOLAX /MIRALAX ) 17 GM/SCOOP powder, Mix 1 capful with 8 ounces of fluid and take by mouth daily as needed., Disp: 3350 g, Rfl: 1 .  sevelamer  carbonate (RENVELA ) 800 MG tablet, Take 3 tablets (2,400 mg total) by mouth 3 (three) times daily with meals and 1 tablet with snacks., Disp: 1080 tablet, Rfl: 3 .  Accu-Chek Softclix Lancets lancets, Use to check blood sugar 3 times daily. (Patient not taking: Reported on 03/24/2024), Disp: 100 each, Rfl: 6 .  Blood Glucose Monitoring Suppl (ACCU-CHEK GUIDE) w/Device KIT, Use to check blood sugar 3 times daily. (Patient not taking: Reported on 03/24/2024), Disp: 1 kit, Rfl: 0 .  glucose blood (ACCU-CHEK GUIDE) test strip, Use to check blood sugar 3 times daily. (Patient not taking: Reported on 03/24/2024),  Disp: 100 each, Rfl: 6 .  Multiple Vitamin (MULTI-VITAMIN DAILY) TABS, Take 1 tablet by mouth at bedtime for supplementation (Patient not taking: Reported on 03/24/2024), Disp: 14 tablet, Rfl: 0 .  Zinc  220 (50 Zn) MG CAPS, Take 1 capsule by mouth daily for minerals, electrolytes (Patient not taking: Reported on 03/24/2024), Disp: 14 capsule, Rfl: 0 Allergies  Allergen Reactions  . Hydralazine  Other (See Comments)  . Hydralazine  Hcl Other (See Comments)    Hair loss  . Hydrocodone Itching and Other (See Comments)    Upset stomach  . Metformin  And Related Nausea And Vomiting and Other (See Comments)    Stomach pains, also  . Other Nausea Only and Other (See Comments)     Lettuce- Does not digest this!!  . Plaquenil [Hydroxychloroquine Sulfate] Hives  . Shellfish Allergy Nausea And Vomiting  . Shellfish-Derived Products Nausea Only and Other (See Comments)    Caused an upset stomach  . Shrimp (Diagnostic) Nausea Only and Other (See Comments)    Upset stomach   . Sulfa Antibiotics Hives  . Sulfur  Hives     Social History   Socioeconomic History  . Marital status: Legally Separated    Spouse name: Porfirio Bristol  . Number of children: 1  . Years of education: some colle  . Highest education level: Not on file  Occupational History  . Occupation: Employed FT as Warden/ranger    Comment: [employer] , NA  Tobacco Use  . Smoking status: Former    Current packs/day: 0.25    Average packs/day: 0.3 packs/day for 0.5 years (0.1 ttl pk-yrs)    Types: Cigarettes    Passive exposure: Never  . Smokeless tobacco: Never  . Tobacco comments:    Former smoker 11/03/21  Vaping Use  . Vaping status: Never Used  Substance and Sexual Activity  . Alcohol use: No  . Drug use: No  . Sexual activity: Not Currently    Birth control/protection: None  Other Topics Concern  . 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 Difficulty of Paying Living Expenses: Not hard at all  Food Insecurity: No Food Insecurity (02/04/2024)   Hunger Vital Sign   . Worried About Programme researcher, broadcasting/film/video in the Last Year: Never true   . Ran Out of Food in the Last Year: Never true  Transportation Needs: No Transportation Needs (02/04/2024)   PRAPARE - Transportation   . Lack of Transportation (Medical): No   . Lack of Transportation (Non-Medical): No  Physical Activity: Inactive (02/04/2024)   Exercise Vital Sign   . Days of Exercise per Week: 0 days   . Minutes of Exercise per Session: 0 min   Stress: No Stress Concern Present (02/04/2024)   [employer] of Occupational Health - Occupational Stress Questionnaire   . Feeling of Stress : Not at all  Social Connections: Moderately Integrated (02/04/2024)   Social Connection and Isolation Panel [NHANES]   . Frequency of Communication with Friends and Family: Twice a week   . Frequency of Social Gatherings with Friends and Family: Once a week   . Attends Religious Services: Never   . Active Member of Clubs or Organizations: Yes   . Attends Banker Meetings: Never   . Marital Status: Married  Catering manager  Violence: Not At Risk (02/04/2024)   Humiliation, Afraid, Rape, and Kick questionnaire   . Fear of Current or Ex-Partner: No   . Emotionally Abused: No   . Physically Abused: No   . Sexually Abused: No    Physical Exam      Future Appointments  Date Time Provider Department Center  06/04/2024 11:10 AM Lawrance Presume, MD [facility] None  06/22/2024  2:20 PM Jorge Newcomer, MD [facility] None  09/17/2024 11:00 AM [facility]  09/17/2024 11:40 AM Kayla Part, MD [facility]

## 2024-04-17 ENCOUNTER — Other Ambulatory Visit (HOSPITAL_COMMUNITY): Payer: Self-pay

## 2024-04-21 ENCOUNTER — Other Ambulatory Visit (HOSPITAL_COMMUNITY): Payer: Self-pay | Admitting: Emergency Medicine

## 2024-04-21 ENCOUNTER — Other Ambulatory Visit (HOSPITAL_COMMUNITY): Payer: Self-pay

## 2024-04-21 NOTE — Progress Notes (Signed)
 Paramedicine Encounter    Patient ID: Nancy [name], female    DOB: 09/17/1961, 63 y.o.   MRN: [id number]   Complaints NONE  Assessment A&O x 4 skin W&D w/ good color. Lung sounds clear and equal bilat.  No peripheral edema noted.  Compliance with meds YES  Pill box filled x 1 week  Refills needed Zofran , Alprazolam  Pt to add amlodopine M/W/F pm.  All other days am dose. Meds changes since last visit NONE    Social changes NONE   BP (!) 160/90 (BP Location: Left Arm, Patient Position: Sitting, Cuff Size: Normal)   Pulse 74   Wt 232 lb (105.2 kg)   SpO2 99%   BMI 41.10 kg/m  Weight yesterday-not taken Last visit weight-230lb  Pt has done well with med compliance.  Pill box reconciled  x 1 week. Pt has run out of Amlodopine.  It was called into pharmacy last visit but for whatever reason it did not get filled.  Reached out to [facility] regarding this prescription and they advised it would be ready for p/u tomorrow. At last home visit, I assisted pt w/ applying her Freestyle 3 Libre sensor.  She states she was not able to get a reading from same.  I reached out to [facility] support to discuss issue.  They advised they would send a replacement sensor and asked that we return the current sensor in a postage paid package that will be sent with the replacement sensor.  Pt has a second sensor and we were able to apply this to her rt upper arm.  Old sensor was placed in the current sensor box and will be sent back to company as requested.  The sensor has a 1 hour initialization before readings can be obtained.  Pt will reach back out to me to let me know if the sensor is reading.  ACTION: Home visit completed  Carlton Chick [phone] 04/21/24  Patient Care Team: Lawrance Presume, MD as PCP - General (Internal Medicine) Avanell Leigh, MD as PCP - Cardiology (Cardiology) Darlis Eisenmenger, MD as PCP - Advanced Heart Failure (Cardiology) Center,  [facility]  Patient Active Problem List   Diagnosis Date Noted   S/P bilateral BKA (below knee amputation) (HCC) 02/04/2024   History of CVA (cerebrovascular accident) 03/22/2023   Community acquired pneumonia 03/22/2023   Pain, unspecified 08/15/2022   Complication of vascular dialysis catheter 08/07/2022   Chest pain 05/30/2022   Fluid overload 05/30/2022   Iron  deficiency anemia, unspecified 04/26/2022   Nausea 04/10/2022   Hyponatremia 04/04/2022   Prolonged QT interval 04/04/2022   Anaphylactic shock, unspecified, initial encounter 02/23/2022   Other allergy, initial encounter 02/23/2022   Pressure injury of skin 02/19/2022   Anemia in chronic kidney disease 02/16/2022   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) 02/16/2022   Hyperlipidemia, unspecified 02/16/2022   Hypertensive heart and chronic kidney disease with heart failure and with stage 5 chronic kidney disease, or end stage renal disease (HCC) 02/16/2022   Personal history of nicotine dependence 02/16/2022   Personal history of transient ischemic attack (TIA), and cerebral infarction without residual deficits 02/16/2022   Secondary hyperparathyroidism of renal origin (HCC) 02/16/2022   Renal anasarca 02/14/2022   ESRD on dialysis (HCC) 02/14/2022   Hyperkalemia 02/14/2022   S/P arteriovenous (AV) fistula creation 12/22/2021   Peripheral artery disease (HCC) 10/27/2021   Acute exacerbation of congestive heart failure (HCC) 10/24/2021   Acute cardiogenic pulmonary edema (HCC)  10/06/2021   CAD (coronary artery disease) 10/06/2021   Goals of care, counseling/discussion    Gangrene of left foot (HCC)    Cellulitis of left foot    CHF (congestive heart failure) (HCC) 07/07/2021   Acute postoperative anemia due to expected blood loss superimposed on anemia of chronic renal insufficiency 06/30/2021   CKD (chronic kidney disease), stage IV (HCC) 06/11/2021   Chronic diastolic CHF (congestive heart failure) (HCC) 06/10/2021    Abnormal nuclear stress test    Dyspnea on exertion 03/07/2021   Orthopnea 02/25/2021   History of cerebrovascular accident (CVA) with residual deficit 05/06/2020   AKI (acute kidney injury) (HCC) 04/20/2020   Generalized weakness 04/20/2020   Dehydration 04/20/2020   Generalized abdominal pain    Pancreatitis, acute 02/29/2020   Cerebrovascular accident (CVA) (HCC) 11/08/2019   Stenosis of right carotid artery 11/08/2019   Cortical age-related cataract of both eyes 08/30/2019   Gait abnormality 08/30/2019   Statin declined 08/30/2019   Gastroesophageal reflux disease without esophagitis 04/18/2018   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   Insulin  dependent type 2 diabetes mellitus (HCC) 10/29/2017   History of macular degeneration 10/29/2017   Chronic cervical pain 10/29/2016   Myalgia 09/14/2016   Insomnia 09/14/2016   Tinea pedis of both feet 08/20/2016   Macular degeneration 08/17/2016   Low back pain 09/21/2014   Hypertensive urgency 09/21/2014   HTN (hypertension) 09/08/2014   Thyroid  nodule 08/17/2014   Morbid obesity (HCC) 08/17/2014   Hypokalemia 08/06/2014   Type II diabetes mellitus with neurological manifestations, uncontrolled 08/04/2014    Current Outpatient Medications:    ALPRAZolam  (XANAX ) 0.25 MG tablet, Take 1 tablet (0.25 mg total) by mouth on Monday, Wednesday and Friday at Hemodialysis., Disp: 12 tablet, Rfl: 2   amLODipine  (NORVASC ) 10 MG tablet, Take 1 tablet (10 mg total) by mouth daily for hypertension, Disp: 90 tablet, Rfl: 1   aspirin  81 MG EC tablet, Take 1 tablet (81 mg total) by mouth daily., Disp: 60 tablet, Rfl: 1   atorvastatin  (LIPITOR) 40 MG tablet, Take 1 tablet (40 mg total) by mouth daily for cholesterol., Disp: 90 tablet, Rfl: 1   Continuous Glucose Receiver (FREESTYLE LIBRE 3 READER) DEVI, Use to check blood glucose continuously. Dx code: E11.59, Z79.4, Disp: 1 each, Rfl: 0   Continuous Glucose Sensor (FREESTYLE LIBRE 3 PLUS SENSOR) MISC, Change  sensor every 15 days. E11.59, Z79.4, Disp: 2 each, Rfl: 11   ezetimibe  (ZETIA ) 10 MG tablet, Take 1 tablet (10 mg total) by mouth daily for cholesterol., Disp: 90 tablet, Rfl: 1   insulin  glargine (LANTUS  SOLOSTAR) 100 UNIT/ML Solostar Pen, Inject 3 Units into the skin 2 (two) times daily., Disp: 15 mL, Rfl: PRN   Insulin  Pen Needle (PEN NEEDLES) 31G X 8 MM MISC, Use for injection with insulin  pen daily., Disp: 100 each, Rfl: 6   isosorbide  mononitrate (IMDUR ) 30 MG 24 hr tablet, Take 2 tablets (60 mg total) by mouth daily for blood pressure., Disp: 180 tablet, Rfl: 1   labetalol  (NORMODYNE ) 300 MG tablet, Take 1 tablet (300 mg total) by mouth 2 (two) times daily., Disp: 180 tablet, Rfl: 1   levothyroxine  (SYNTHROID ) 25 MCG tablet, Take 1 tablet (25 mcg total) by mouth daily for low thyroid  hormone., Disp: 90 tablet, Rfl: 1   omeprazole  (PRILOSEC) 20 MG capsule, Take 2 capsules (40 mg total) by mouth daily for acid reflux., Disp: 180 capsule, Rfl: 1   sevelamer  carbonate (RENVELA ) 800 MG tablet, Take 3 tablets (2,400  mg total) by mouth 3 (three) times daily with meals and 1 tablet with snacks., Disp: 1080 tablet, Rfl: 3   Accu-Chek Softclix Lancets lancets, Use to check blood sugar 3 times daily. (Patient not taking: Reported on 03/24/2024), Disp: 100 each, Rfl: 6   acetaminophen  (TYLENOL ) 500 MG tablet, Take 1,000 mg by mouth every 8 (eight) hours as needed for mild pain (pain score 1-3), moderate pain (pain score 4-6) or headache., Disp: , Rfl:    albuterol  (VENTOLIN  HFA) 108 (90 Base) MCG/ACT inhaler, Inhale 2 puffs into the lungs every 6 (six) hours as needed., Disp: 18 g, Rfl: 3   Blood Glucose Monitoring Suppl (ACCU-CHEK GUIDE) w/Device KIT, Use to check blood sugar 3 times daily. (Patient not taking: Reported on 03/24/2024), Disp: 1 kit, Rfl: 0   glucose blood (ACCU-CHEK GUIDE) test strip, Use to check blood sugar 3 times daily. (Patient not taking: Reported on 03/24/2024), Disp: 100 each, Rfl: 6    Multiple Vitamin (MULTI-VITAMIN DAILY) TABS, Take 1 tablet by mouth at bedtime for supplementation (Patient not taking: Reported on 03/24/2024), Disp: 14 tablet, Rfl: 0   ondansetron  (ZOFRAN ) 8 MG tablet, Take 1 tablet (8 mg total) by mouth every 8 (eight) hours as needed for nausea (Patient not taking: Reported on 04/21/2024), Disp: 42 tablet, Rfl: 0   polyethylene glycol powder (GLYCOLAX /MIRALAX ) 17 GM/SCOOP powder, Mix 1 capful with 8 ounces of fluid and take by mouth daily as needed. (Patient not taking: Reported on 04/21/2024), Disp: 3350 g, Rfl: 1   Zinc  220 (50 Zn) MG CAPS, Take 1 capsule by mouth daily for minerals, electrolytes (Patient not taking: Reported on 03/24/2024), Disp: 14 capsule, Rfl: 0 Allergies  Allergen Reactions   Hydralazine  Other (See Comments)   Hydralazine  Hcl Other (See Comments)    Hair loss   Hydrocodone Itching and Other (See Comments)    Upset stomach   Metformin  And Related Nausea And Vomiting and Other (See Comments)    Stomach pains, also   Other Nausea Only and Other (See Comments)    Lettuce- Does not digest this!!   Plaquenil [Hydroxychloroquine Sulfate] Hives   Shellfish Allergy Nausea And Vomiting   Shellfish-Derived Products Nausea Only and Other (See Comments)    Caused an upset stomach   Shrimp (Diagnostic) Nausea Only and Other (See Comments)    Upset stomach    Sulfa Antibiotics Hives   Sulfur  Hives     Social History   Socioeconomic History   Marital status: Legally Separated    Spouse name: Porfirio Bristol   Number of children: 1   Years of education: some colle   Highest education level: Not on file  Occupational History   Occupation: Employed FT as Warden/ranger    Comment: [employer] , NA  Tobacco Use   Smoking status: Former    Current packs/day: 0.25    Average packs/day: 0.3 packs/day for 0.5 years (0.1 ttl pk-yrs)    Types: Cigarettes    Passive exposure: Never   Smokeless tobacco: Never   Tobacco comments:    Former smoker 11/03/21   Vaping Use   Vaping status: Never Used  Substance and Sexual Activity   Alcohol use: No   Drug use: No   Sexual activity: Not Currently    Birth control/protection: None  Other Topics Concern   Not on file  Social History Narrative   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  Resource Strain: Low Risk  (02/04/2024)   Overall Financial Resource Strain (CARDIA)    Difficulty of Paying Living Expenses: Not hard at all  Food Insecurity: No Food Insecurity (02/04/2024)   Hunger Vital Sign    Worried About Running Out of Food in the Last Year: Never true    Ran Out of Food in the Last Year: Never true  Transportation Needs: No Transportation Needs (02/04/2024)   PRAPARE - Administrator, Civil Service (Medical): No    Lack of Transportation (Non-Medical): No  Physical Activity: Inactive (02/04/2024)   Exercise Vital Sign    Days of Exercise per Week: 0 days    Minutes of Exercise per Session: 0 min  Stress: No Stress Concern Present (02/04/2024)   [employer] of Occupational Health - Occupational Stress Questionnaire    Feeling of Stress : Not at all  Social Connections: Moderately Integrated (02/04/2024)   Social Connection and Isolation Panel [NHANES]    Frequency of Communication with Friends and Family: Twice a week    Frequency of Social Gatherings with Friends and Family: Once a week    Attends Religious Services: Never    Database administrator or Organizations: Yes    Attends Banker Meetings: Never    Marital Status: Married  Catering manager Violence: Not At Risk (02/04/2024)   Humiliation, Afraid, Rape, and Kick questionnaire    Fear of Current or Ex-Partner: No    Emotionally Abused: No    Physically Abused: No    Sexually Abused: No    Physical Exam      Future Appointments  Date Time Provider Department Center  06/04/2024 11:10 AM Lawrance Presume, MD  [facility] None  06/12/2024  9:40 AM Brigitte Canard, PA-C [facility]  06/22/2024  2:20 PM Jorge Newcomer, MD [facility] None  09/17/2024 11:00 AM [facility]  09/17/2024 11:40 AM Kayla Part, MD [facility]

## 2024-04-22 ENCOUNTER — Other Ambulatory Visit (HOSPITAL_COMMUNITY): Payer: Self-pay

## 2024-04-22 ENCOUNTER — Other Ambulatory Visit: Payer: Self-pay | Admitting: Internal Medicine

## 2024-04-22 DIAGNOSIS — K219 Gastro-esophageal reflux disease without esophagitis: Secondary | ICD-10-CM

## 2024-04-22 DIAGNOSIS — F411 Generalized anxiety disorder: Secondary | ICD-10-CM

## 2024-04-22 MED ORDER — ALPRAZOLAM 0.25 MG PO TABS
0.2500 mg | ORAL_TABLET | ORAL | 2 refills | Status: DC | PRN
  Filled 2024-04-22: qty 12, 28d supply, fill #0
  Filled 2024-05-12 – 2024-05-19 (×2): qty 12, 28d supply, fill #1

## 2024-04-28 ENCOUNTER — Other Ambulatory Visit (HOSPITAL_COMMUNITY): Payer: Self-pay | Admitting: Emergency Medicine

## 2024-04-29 ENCOUNTER — Other Ambulatory Visit (HOSPITAL_COMMUNITY): Payer: Self-pay

## 2024-04-29 MED ORDER — ONDANSETRON HCL 4 MG PO TABS
4.0000 mg | ORAL_TABLET | Freq: Three times a day (TID) | ORAL | 3 refills | Status: DC | PRN
  Filled 2024-04-29: qty 90, 30d supply, fill #0
  Filled 2024-08-19: qty 90, 30d supply, fill #1

## 2024-04-29 NOTE — Progress Notes (Signed)
 Today's visit, med rec only x's 1 week.  Pt has diarrhea and requested not to be evaluated.      Ermalinda Hays, EMT-Paramedic [phone] 04/29/2024

## 2024-05-06 ENCOUNTER — Other Ambulatory Visit (HOSPITAL_COMMUNITY): Payer: Self-pay | Admitting: Emergency Medicine

## 2024-05-06 NOTE — Progress Notes (Signed)
 Today's visit is med rec only.   Met Mrs. Cesaro at her dialysis appointment and completed 1 week med rec.  Also applied a new [employer]. I will be due to be changed in 15 days.    Ermalinda Hays, EMT-Paramedic [phone] 05/06/2024

## 2024-05-12 ENCOUNTER — Other Ambulatory Visit (HOSPITAL_COMMUNITY): Payer: Self-pay | Admitting: Emergency Medicine

## 2024-05-12 ENCOUNTER — Other Ambulatory Visit (HOSPITAL_COMMUNITY): Payer: Self-pay

## 2024-05-12 NOTE — Progress Notes (Unsigned)
 Paramedicine Encounter    Patient ID: Nancy [name], female    DOB: July 22, 1961, 63 y.o.   MRN: [id number]   Complaints NONE  Assessment A&O x 4, skin W&D w/ good color.  Denies chest pain or SOB.  Lung sounds clear throughout w/ no peripheral edema noted  Compliance with meds YES  Pill box filled x 1 week  Refills needed Atorvastatin , Isosorbide , Zetia , Omeprazole  ready for pick up  Meds changes since last visit NONE    Social changes NONE   BP 130/70 (BP Location: Right Arm, Patient Position: Sitting, Cuff Size: Normal)   Pulse 77   Wt 238 lb 3.2 oz (108 kg)   SpO2 96%   BMI 42.20 kg/m  Weight yesterday- not taken Last visit weight-***  ACTION: {Paramed [phone]}  Ermalinda Hays, EMT-Paramedic [phone] 05/12/24  Patient Care Team: Lawrance Presume, MD as PCP - General (Internal Medicine) Avanell Leigh, MD as PCP - Cardiology (Cardiology) Darlis Eisenmenger, MD as PCP - Advanced Heart Failure (Cardiology) Center, [facility]  Patient Active Problem List   Diagnosis Date Noted  . S/P bilateral BKA (below knee amputation) (HCC) 02/04/2024  . History of CVA (cerebrovascular accident) 03/22/2023  . Community acquired pneumonia 03/22/2023  . Pain, unspecified 08/15/2022  . Complication of vascular dialysis catheter 08/07/2022  . Chest pain 05/30/2022  . Fluid overload 05/30/2022  . Iron  deficiency anemia, unspecified 04/26/2022  . Nausea 04/10/2022  . Hyponatremia 04/04/2022  . Prolonged QT interval 04/04/2022  . Anaphylactic shock, unspecified, initial encounter 02/23/2022  . Other allergy, initial encounter 02/23/2022  . Pressure injury of skin 02/19/2022  . Anemia in chronic kidney disease 02/16/2022  . 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 Hyperlipidemia, unspecified 02/16/2022  . Hypertensive heart and chronic kidney disease with heart failure and with stage 5 chronic kidney disease, or end stage renal disease (HCC)  02/16/2022  . Personal history of nicotine dependence 02/16/2022  . Personal history of transient ischemic attack (TIA), and cerebral infarction without residual deficits 02/16/2022  . Secondary hyperparathyroidism of renal origin (HCC) 02/16/2022  . Renal anasarca 02/14/2022  . ESRD on dialysis (HCC) 02/14/2022  . Hyperkalemia 02/14/2022  . S/P arteriovenous (AV) fistula creation 12/22/2021  . Peripheral artery disease (HCC) 10/27/2021  . Acute exacerbation of congestive heart failure (HCC) 10/24/2021  . Acute cardiogenic pulmonary edema (HCC) 10/06/2021  . CAD (coronary artery disease) 10/06/2021  . Goals of care, counseling/discussion   . Gangrene of left foot (HCC)   . Cellulitis of left foot   . CHF (congestive heart failure) (HCC) 07/07/2021  . Acute postoperative anemia due to expected blood loss superimposed on anemia of chronic renal insufficiency 06/30/2021  . CKD (chronic kidney disease), stage IV (HCC) 06/11/2021  . Chronic diastolic CHF (congestive heart failure) (HCC) 06/10/2021  . Abnormal nuclear stress test   . Dyspnea on exertion 03/07/2021  . Orthopnea 02/25/2021  . History of cerebrovascular accident (CVA) with residual deficit 05/06/2020  . AKI (acute kidney injury) (HCC) 04/20/2020  . Generalized weakness 04/20/2020  . Dehydration 04/20/2020  . Generalized abdominal pain   . Pancreatitis, acute 02/29/2020  . Cerebrovascular accident (CVA) (HCC) 11/08/2019  . Stenosis of right carotid artery 11/08/2019  . Cortical age-related cataract of both eyes 08/30/2019  . Gait abnormality 08/30/2019  . Statin declined 08/30/2019  . Gastroesophageal reflux disease without esophagitis 04/18/2018  . 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 Insulin  dependent type 2 diabetes mellitus (HCC) 10/29/2017  . History of  macular degeneration 10/29/2017  . Chronic cervical pain 10/29/2016  . Myalgia 09/14/2016  . Insomnia 09/14/2016  . Tinea pedis of both feet 08/20/2016  . Macular  degeneration 08/17/2016  . Low back pain 09/21/2014  . Hypertensive urgency 09/21/2014  . HTN (hypertension) 09/08/2014  . Thyroid  nodule 08/17/2014  . Morbid obesity (HCC) 08/17/2014  . Hypokalemia 08/06/2014  . Type II diabetes mellitus with neurological manifestations, uncontrolled 08/04/2014    Current Outpatient Medications:  .  Accu-Chek Softclix Lancets lancets, Use to check blood sugar 3 times daily. (Patient not taking: Reported on 03/24/2024), Disp: 100 each, Rfl: 6 .  acetaminophen  (TYLENOL ) 500 MG tablet, Take 1,000 mg by mouth every 8 (eight) hours as needed for mild pain (pain score 1-3), moderate pain (pain score 4-6) or headache., Disp: , Rfl:  .  albuterol  (VENTOLIN  HFA) 108 (90 Base) MCG/ACT inhaler, Inhale 2 puffs into the lungs every 6 (six) hours as needed., Disp: 18 g, Rfl: 3 .  ALPRAZolam  (XANAX ) 0.25 MG tablet, Take 1 tablet (0.25 mg total) by mouth on Monday, Wednesday and Friday at Hemodialysis., Disp: 12 tablet, Rfl: 2 .  amLODipine  (NORVASC ) 10 MG tablet, Take 1 tablet (10 mg total) by mouth daily for hypertension, Disp: 90 tablet, Rfl: 1 .  aspirin  81 MG EC tablet, Take 1 tablet (81 mg total) by mouth daily., Disp: 60 tablet, Rfl: 1 .  atorvastatin  (LIPITOR) 40 MG tablet, Take 1 tablet (40 mg total) by mouth daily for cholesterol., Disp: 90 tablet, Rfl: 1 .  Blood Glucose Monitoring Suppl (ACCU-CHEK GUIDE) w/Device KIT, Use to check blood sugar 3 times daily. (Patient not taking: Reported on 03/24/2024), Disp: 1 kit, Rfl: 0 .  Continuous Glucose Receiver (FREESTYLE LIBRE 3 READER) DEVI, Use to check blood glucose continuously. Dx code: E11.59, Z79.4, Disp: 1 each, Rfl: 0 .  Continuous Glucose Sensor (FREESTYLE LIBRE 3 PLUS SENSOR) MISC, Change sensor every 15 days. E11.59, Z79.4, Disp: 2 each, Rfl: 11 .  ezetimibe  (ZETIA ) 10 MG tablet, Take 1 tablet (10 mg total) by mouth daily for cholesterol., Disp: 90 tablet, Rfl: 1 .  glucose blood (ACCU-CHEK GUIDE) test strip, Use  to check blood sugar 3 times daily. (Patient not taking: Reported on 03/24/2024), Disp: 100 each, Rfl: 6 .  insulin  glargine (LANTUS  SOLOSTAR) 100 UNIT/ML Solostar Pen, Inject 3 Units into the skin 2 (two) times daily., Disp: 15 mL, Rfl: PRN .  Insulin  Pen Needle (PEN NEEDLES) 31G X 8 MM MISC, Use for injection with insulin  pen daily., Disp: 100 each, Rfl: 6 .  isosorbide  mononitrate (IMDUR ) 30 MG 24 hr tablet, Take 2 tablets (60 mg total) by mouth daily for blood pressure., Disp: 180 tablet, Rfl: 1 .  labetalol  (NORMODYNE ) 300 MG tablet, Take 1 tablet (300 mg total) by mouth 2 (two) times daily., Disp: 180 tablet, Rfl: 1 .  levothyroxine  (SYNTHROID ) 25 MCG tablet, Take 1 tablet (25 mcg total) by mouth daily for low thyroid  hormone., Disp: 90 tablet, Rfl: 1 .  Multiple Vitamin (MULTI-VITAMIN DAILY) TABS, Take 1 tablet by mouth at bedtime for supplementation (Patient not taking: Reported on 03/24/2024), Disp: 14 tablet, Rfl: 0 .  omeprazole  (PRILOSEC) 20 MG capsule, Take 2 capsules (40 mg total) by mouth daily for acid reflux., Disp: 180 capsule, Rfl: 1 .  ondansetron  (ZOFRAN ) 4 MG tablet, Take 1 tablet (4 mg total) by mouth every 8 (eight) hours as needed for nausea. (Patient not taking: Reported on 05/12/2024), Disp: 90 tablet, Rfl: 3 .  ondansetron  (ZOFRAN ) 8 MG tablet, Take 1 tablet (8 mg total) by mouth every 8 (eight) hours as needed for nausea (Patient not taking: Reported on 04/21/2024), Disp: 42 tablet, Rfl: 0 .  polyethylene glycol powder (GLYCOLAX /MIRALAX ) 17 GM/SCOOP powder, Mix 1 capful with 8 ounces of fluid and take by mouth daily as needed. (Patient not taking: Reported on 05/12/2024), Disp: 3350 g, Rfl: 1 .  sevelamer  carbonate (RENVELA ) 800 MG tablet, Take 3 tablets (2,400 mg total) by mouth 3 (three) times daily with meals and 1 tablet with snacks., Disp: 1080 tablet, Rfl: 3 .  Zinc  220 (50 Zn) MG CAPS, Take 1 capsule by mouth daily for minerals, electrolytes (Patient not taking: Reported on  03/24/2024), Disp: 14 capsule, Rfl: 0 Allergies  Allergen Reactions  . Hydralazine  Other (See Comments)  . Hydralazine  Hcl Other (See Comments)    Hair loss  . Hydrocodone Itching and Other (See Comments)    Upset stomach  . Metformin  And Related Nausea And Vomiting and Other (See Comments)    Stomach pains, also  . Other Nausea Only and Other (See Comments)    Lettuce- Does not digest this!!  . Plaquenil [Hydroxychloroquine Sulfate] Hives  . Shellfish Allergy Nausea And Vomiting  . Shellfish-Derived Products Nausea Only and Other (See Comments)    Caused an upset stomach  . Shrimp (Diagnostic) Nausea Only and Other (See Comments)    Upset stomach   . Sulfa Antibiotics Hives  . Sulfur  Hives     Social History   Socioeconomic History  . Marital status: Legally Separated    Spouse name: Porfirio Bristol  . Number of children: 1  . Years of education: some colle  . Highest education level: Not on file  Occupational History  . Occupation: Employed FT as Warden/ranger    Comment: [employer] , NA  Tobacco Use  . Smoking status: Former    Current packs/day: 0.25    Average packs/day: 0.3 packs/day for 0.5 years (0.1 ttl pk-yrs)    Types: Cigarettes    Passive exposure: Never  . Smokeless tobacco: Never  . Tobacco comments:    Former smoker 11/03/21  Vaping Use  . Vaping status: Never Used  Substance and Sexual Activity  . Alcohol use: No  . Drug use: No  . Sexual activity: Not Currently    Birth control/protection: None  Other Topics Concern  . 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 Difficulty of Paying Living Expenses: Not hard at all  Food Insecurity: No Food Insecurity (02/04/2024)   Hunger Vital Sign   . Worried About Programme researcher, broadcasting/film/video in the Last Year: Never true   . Ran  Out of Food in the Last Year: Never true  Transportation Needs: No Transportation Needs (02/04/2024)   PRAPARE - Transportation   . Lack of Transportation (Medical): No   . Lack of Transportation (Non-Medical): No  Physical Activity: Inactive (02/04/2024)   Exercise Vital Sign   . Days of Exercise per Week: 0 days   . Minutes of Exercise per Session: 0 min  Stress: No Stress Concern Present (02/04/2024)   [employer] of Occupational Health - Occupational Stress Questionnaire   . Feeling of Stress : Not at all  Social Connections: Moderately Integrated (02/04/2024)   Social  Connection and Isolation Panel [NHANES]   . Frequency of Communication with Friends and Family: Twice a week   . Frequency of Social Gatherings with Friends and Family: Once a week   . Attends Religious Services: Never   . Active Member of Clubs or Organizations: Yes   . Attends Banker Meetings: Never   . Marital Status: Married  Catering manager Violence: Not At Risk (02/04/2024)   Humiliation, Afraid, Rape, and Kick questionnaire   . Fear of Current or Ex-Partner: No   . Emotionally Abused: No   . Physically Abused: No   . Sexually Abused: No    Physical Exam      Future Appointments  Date Time Provider Department Center  06/04/2024 11:10 AM Lawrance Presume, MD [facility] None  06/12/2024  9:40 AM Brigitte Canard, PA-C [facility]  06/22/2024  2:20 PM Jorge Newcomer, MD [facility] None  09/17/2024 11:00 AM [facility]  09/17/2024 11:40 AM Kayla Part, MD [facility]

## 2024-05-14 ENCOUNTER — Encounter: Payer: Self-pay | Admitting: Gastroenterology

## 2024-05-14 ENCOUNTER — Ambulatory Visit: Admitting: Gastroenterology

## 2024-05-14 VITALS — BP 122/76 | HR 76 | Ht 63.0 in | Wt 238.1 lb

## 2024-05-14 DIAGNOSIS — Z89512 Acquired absence of left leg below knee: Secondary | ICD-10-CM

## 2024-05-14 DIAGNOSIS — R1084 Generalized abdominal pain: Secondary | ICD-10-CM

## 2024-05-14 DIAGNOSIS — Z89511 Acquired absence of right leg below knee: Secondary | ICD-10-CM

## 2024-05-14 DIAGNOSIS — Z8719 Personal history of other diseases of the digestive system: Secondary | ICD-10-CM

## 2024-05-14 DIAGNOSIS — Z992 Dependence on renal dialysis: Secondary | ICD-10-CM

## 2024-05-14 DIAGNOSIS — R1030 Lower abdominal pain, unspecified: Secondary | ICD-10-CM

## 2024-05-14 DIAGNOSIS — I5032 Chronic diastolic (congestive) heart failure: Secondary | ICD-10-CM

## 2024-05-14 DIAGNOSIS — N186 End stage renal disease: Secondary | ICD-10-CM

## 2024-05-14 DIAGNOSIS — I2510 Atherosclerotic heart disease of native coronary artery without angina pectoris: Secondary | ICD-10-CM

## 2024-05-14 DIAGNOSIS — I739 Peripheral vascular disease, unspecified: Secondary | ICD-10-CM

## 2024-05-14 DIAGNOSIS — R1011 Right upper quadrant pain: Secondary | ICD-10-CM | POA: Diagnosis not present

## 2024-05-14 DIAGNOSIS — E119 Type 2 diabetes mellitus without complications: Secondary | ICD-10-CM

## 2024-05-14 DIAGNOSIS — R110 Nausea: Secondary | ICD-10-CM

## 2024-05-14 DIAGNOSIS — K5900 Constipation, unspecified: Secondary | ICD-10-CM

## 2024-05-14 DIAGNOSIS — I639 Cerebral infarction, unspecified: Secondary | ICD-10-CM

## 2024-05-14 DIAGNOSIS — R9431 Abnormal electrocardiogram [ECG] [EKG]: Secondary | ICD-10-CM

## 2024-05-14 DIAGNOSIS — D649 Anemia, unspecified: Secondary | ICD-10-CM

## 2024-05-14 NOTE — Progress Notes (Signed)
 Chief Complaint: Constipation/IBS Primary GI MD: Dr. Bridgett Camps  HPI: Discussed the use of AI scribe software for clinical note transcription with the patient, who gave verbal consent to proceed.  History of Present Illness Nancy [name] is a 63 year old female history of ESRD on HD, diabetes, wheelchair-bound, CVA (2020), CHF, CAD, gastroparesis who presents with nausea, cramping, and bloating associated with constipation.  She experiences nausea several times a day, accompanied by cramping and pain primarily on the right side, which worsens with bending. The pain occurs during dialysis sessions and at home.  Her bowel movements are concerning, with constipation being predominant, interspersed with intermittent diarrhea. She has four to six bowel movements per week, which are often hard and in the form of 'balls'. Despite episodes of diarrhea, she experiences persistent bloating that does not significantly decrease.  Her bloating and pain have been ongoing. She has tried some treatments for constipation but finds them insufficient.  She is on a restricted liquid intake due to her dialysis schedule, which occurs on Mondays, Wednesdays, and Fridays. She works second shift from 3 to 11 PM, which influences her availability for appointments.  No significant changes in bowel habits aside from constipation and intermittent diarrhea. Reports bloating and abdominal pain, particularly on the right side, which worsens with movement.   PREVIOUS GI WORKUP   EGD 2021 inpatient for nausea Normal esophagus, stomach, duodenum  Past Medical History:  Diagnosis Date   Anemia 2006   Anginal pain (HCC)    Carotid artery disease (HCC)    right ICA occlusion, 40-59% LICA 02/2021 US    CHF (congestive heart failure) (HCC)    Coronary artery disease    Depression 2014   previously on amitryptiline    Diabetes mellitus with neurological manifestation (HCC) 2006   Diabetic peripheral neuropathy (HCC)  04/26/2020   Dysphagia    Dyspnea    ESRD on hemodialysis (HCC)    Fracture of left ankle 1997   Gastroparesis 07/2016   Heart murmur    HOH (hard of hearing) 2004   Hyperlipidemia 2006   Hypertension 2006   IBS (irritable bowel syndrome) 2002   Leukopenia 2015   Macular degeneration 11/2019   Peripheral vascular disease (HCC)    Shingles 2009   [facility]roke [facility]) 2020   Thyroid  nodule 2004    Past Surgical History:  Procedure Laterality Date   A/V FISTULAGRAM Right 09/25/2022   Procedure: A/V Fistulagram;  Surgeon: Margherita Shell, MD;  Location: [facility] INVASIVE [facility];  Service: Cardiovascular;  Laterality: Right;   ABDOMINAL HYSTERECTOMY  2005   AMPUTATION Bilateral 04/04/2022   Procedure: BILATERAL BELOW KNEE AMPUTATION;  Surgeon: Timothy Ford, MD;  Location: [facility] OR;  Service: Orthopedics;  Laterality: Bilateral;   AV FISTULA PLACEMENT Right 12/22/2021   Procedure: RIGHT ARM ARTERIOVENOUS (AV) BRACIOCPHELAIC FISTULA CREATION;  Surgeon: Adine Hoof, MD;  Location: [facility] OR;  Service: Vascular;  Laterality: Right;   AV FISTULA PLACEMENT Right 02/19/2022   Procedure: RIGHT ARTERIOVENOUS FISTULA;  Surgeon: Dannis Dy, MD;  Location: [facility] OR;  Service: Vascular;  Laterality: Right;   AV FISTULA PLACEMENT Left 12/24/2023   Procedure: LEFT BRACHIOCEPHALIC ARTERIOVENOUS (AV) FISTULA CREATION;  Surgeon: Kayla Part, MD;  Location: [facility] OR;  Service: Vascular;  Laterality: Left;   BASCILIC VEIN TRANSPOSITION Right 08/07/2022   Procedure: SECOND STAGE RIGHT BASILIC VEIN TRANSPOSITION;  Surgeon: Dannis Dy, MD;  Location: [facility] OR;  Service: Vascular;  Laterality: Right;   CATARACT  EXTRACTION Left 11/2019   CESAREAN SECTION  1983   CHOLECYSTECTOMY N/A 03/05/2020   Procedure: LAPAROSCOPIC CHOLECYSTECTOMY WITH INTRAOPERATIVE CHOLANGIOGRAM;  Surgeon: Dareen Ebbing, MD;  Location: [facility] ORS;  Service: General;  Laterality: N/A;   COLONOSCOPY     [facility] said no  polyps were found   ESOPHAGOGASTRODUODENOSCOPY (EGD) WITH PROPOFOL  Left 08/26/2014   Procedure: ESOPHAGOGASTRODUODENOSCOPY (EGD) WITH PROPOFOL ;  Surgeon: Evangeline Hilts, MD;  Location: [facility];  Service: Endoscopy;  Laterality: Left;   ESOPHAGOGASTRODUODENOSCOPY (EGD) WITH PROPOFOL  N/A 03/03/2020   Procedure: ESOPHAGOGASTRODUODENOSCOPY (EGD) WITH PROPOFOL ;  Surgeon: Nannette Babe, MD;  Location: [facility];  Service: Gastroenterology;  Laterality: N/A;   IR FLUORO GUIDE CV LINE RIGHT  02/15/2022   IR US  GUIDE VASC ACCESS RIGHT  02/15/2022   PERIPHERAL VASCULAR BALLOON ANGIOPLASTY Right 09/25/2022   Procedure: PERIPHERAL VASCULAR BALLOON ANGIOPLASTY;  Surgeon: Margherita Shell, MD;  Location: [facility];  Service: Cardiovascular;  Laterality: Right;  AVF   RIGHT HEART CATH N/A 07/14/2021   Procedure: RIGHT HEART CATH;  Surgeon: Darlis Eisenmenger, MD;  Location: [facility] INVASIVE CV LAB;  Service: Cardiovascular;  Laterality: N/A;   RIGHT/LEFT HEART CATH AND CORONARY ANGIOGRAPHY N/A 04/27/2021   Procedure: RIGHT/LEFT HEART CATH AND CORONARY ANGIOGRAPHY;  Surgeon: Avanell Leigh, MD;  Location: [facility];  Service: Cardiovascular;  Laterality: N/A;    Current Outpatient Medications  Medication Sig Dispense Refill   Accu-Chek Softclix Lancets lancets Use to check blood sugar 3 times daily. 100 each 6   acetaminophen  (TYLENOL ) 500 MG tablet Take 1,000 mg by mouth every 8 (eight) hours as needed for mild pain (pain score 1-3), moderate pain (pain score 4-6) or headache.     albuterol  (VENTOLIN  HFA) 108 (90 Base) MCG/ACT inhaler Inhale 2 puffs into the lungs every 6 (six) hours as needed. 18 g 3   ALPRAZolam  (XANAX ) 0.25 MG tablet Take 1 tablet (0.25 mg total) by mouth on Monday, Wednesday and Friday at Hemodialysis. 12 tablet 2   amLODipine  (NORVASC ) 10 MG tablet Take 1 tablet (10 mg total) by mouth daily for hypertension 90 tablet 1   aspirin  81 MG EC tablet Take 1 tablet (81 mg  total) by mouth daily. 60 tablet 1   atorvastatin  (LIPITOR) 40 MG tablet Take 1 tablet (40 mg total) by mouth daily for cholesterol. 90 tablet 1   Blood Glucose Monitoring Suppl (ACCU-CHEK GUIDE) w/Device KIT Use to check blood sugar 3 times daily. 1 kit 0   Continuous Glucose Receiver (FREESTYLE LIBRE 3 READER) DEVI Use to check blood glucose continuously. Dx code: E11.59, Z79.4 1 each 0   Continuous Glucose Sensor (FREESTYLE LIBRE 3 PLUS SENSOR) MISC Change sensor every 15 days. E11.59, Z79.4 2 each 11   ezetimibe  (ZETIA ) 10 MG tablet Take 1 tablet (10 mg total) by mouth daily for cholesterol. 90 tablet 1   glucose blood (ACCU-CHEK GUIDE) test strip Use to check blood sugar 3 times daily. 100 each 6   insulin  glargine (LANTUS  SOLOSTAR) 100 UNIT/ML Solostar Pen Inject 3 Units into the skin 2 (two) times daily. 15 mL PRN   Insulin  Pen Needle (PEN NEEDLES) 31G X 8 MM MISC Use for injection with insulin  pen daily. 100 each 6   isosorbide  mononitrate (IMDUR ) 30 MG 24 hr tablet Take 2 tablets (60 mg total) by mouth daily for blood pressure. 180 tablet 1   labetalol  (NORMODYNE ) 300 MG tablet Take 1 tablet (300 mg total) by mouth 2 (  two) times daily. 180 tablet 1   levothyroxine  (SYNTHROID ) 25 MCG tablet Take 1 tablet (25 mcg total) by mouth daily for low thyroid  hormone. 90 tablet 1   Multiple Vitamin (MULTI-VITAMIN DAILY) TABS Take 1 tablet by mouth at bedtime for supplementation 14 tablet 0   omeprazole  (PRILOSEC) 20 MG capsule Take 2 capsules (40 mg total) by mouth daily for acid reflux. 180 capsule 1   polyethylene glycol powder (GLYCOLAX /MIRALAX ) 17 GM/SCOOP powder Mix 1 capful with 8 ounces of fluid and take by mouth daily as needed. 3350 g 1   sevelamer  carbonate (RENVELA ) 800 MG tablet Take 3 tablets (2,400 mg total) by mouth 3 (three) times daily with meals and 1 tablet with snacks. 1080 tablet 3   ondansetron  (ZOFRAN ) 4 MG tablet Take 1 tablet (4 mg total) by mouth every 8 (eight) hours as  needed for nausea. (Patient not taking: Reported on 05/06/2024) 90 tablet 3   Zinc  220 (50 Zn) MG CAPS Take 1 capsule by mouth daily for minerals, electrolytes (Patient not taking: Reported on 03/24/2024) 14 capsule 0   No current facility-administered medications for this visit.    Allergies as of 05/14/2024 - Review Complete 05/14/2024  Allergen Reaction Noted   Hydralazine  Other (See Comments) 10/22/2022   Hydralazine  hcl Other (See Comments) 11/08/2019   Hydrocodone Itching and Other (See Comments) 11/17/2013   Metformin  and related Nausea And Vomiting and Other (See Comments) 09/21/2014   Other Nausea Only and Other (See Comments) 05/09/2021   Plaquenil [hydroxychloroquine sulfate] Hives 10/07/2018   Shellfish allergy Nausea And Vomiting 10/22/2022   Shellfish-derived products Nausea Only and Other (See Comments) 05/09/2021   Shrimp (diagnostic) Nausea Only and Other (See Comments) 04/24/2021   Sulfa antibiotics Hives 05/09/2021   Sulfur  Hives 11/17/2013    Family History  Problem Relation Age of Onset   Hypertension Mother    Heart disease Mother    Diabetes Mother    Thyroid  disease Mother    Congestive Heart Failure Mother    Breast cancer Maternal Grandmother    Colon cancer Maternal Grandfather    Heart attack Sister    Heart disease Brother    Hyperlipidemia Brother    Hypertension Brother    Diabetes Father    Breast cancer Maternal Aunt     Social History   Socioeconomic History   Marital status: Legally Separated    Spouse name: Porfirio Bristol   Number of children: 1   Years of education: some colle   Highest education level: Not on file  Occupational History   Occupation: Employed FT as Warden/ranger    Comment: [employer] , NA  Tobacco Use   Smoking status: Former    Current packs/day: 0.25    Average packs/day: 0.3 packs/day for 0.5 years (0.1 ttl pk-yrs)    Types: Cigarettes    Passive exposure: Never   Smokeless tobacco: Never   Tobacco comments:     Former smoker 11/03/21  Vaping Use   Vaping status: Never Used  Substance and Sexual Activity   Alcohol use: No   Drug use: No   Sexual activity: Not Currently    Birth control/protection: None  Other Topics Concern   Not on file  Social History Narrative   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    Social Drivers of Health   Financial Resource Strain: Low Risk  (02/04/2024)   Overall Financial Resource Strain (CARDIA)    Difficulty of Paying Living Expenses:  Not hard at all  Food Insecurity: No Food Insecurity (02/04/2024)   Hunger Vital Sign    Worried About Running Out of Food in the Last Year: Never true    Ran Out of Food in the Last Year: Never true  Transportation Needs: No Transportation Needs (02/04/2024)   PRAPARE - Administrator, Civil Service (Medical): No    Lack of Transportation (Non-Medical): No  Physical Activity: Inactive (02/04/2024)   Exercise Vital Sign    Days of Exercise per Week: 0 days    Minutes of Exercise per Session: 0 min  Stress: No Stress Concern Present (02/04/2024)   [employer] of Occupational Health - Occupational Stress Questionnaire    Feeling of Stress : Not at all  Social Connections: Moderately Integrated (02/04/2024)   Social Connection and Isolation Panel [NHANES]    Frequency of Communication with Friends and Family: Twice a week    Frequency of Social Gatherings with Friends and Family: Once a week    Attends Religious Services: Never    Database administrator or Organizations: Yes    Attends Banker Meetings: Never    Marital Status: Married  Catering manager Violence: Not At Risk (02/04/2024)   Humiliation, Afraid, Rape, and Kick questionnaire    Fear of Current or Ex-Partner: No    Emotionally Abused: No    Physically Abused: No    Sexually Abused: No    Review of Systems:    Constitutional: No weight loss, fever, chills, weakness or fatigue HEENT: Eyes: No  change in vision               Ears, Nose, Throat:  No change in hearing or congestion Skin: No rash or itching Cardiovascular: No chest pain, chest pressure or palpitations   Respiratory: No SOB or cough Gastrointestinal: See HPI and otherwise negative Genitourinary: No dysuria or change in urinary frequency Neurological: No headache, dizziness or syncope Musculoskeletal: No new muscle or joint pain Hematologic: No bleeding or bruising Psychiatric: No history of depression or anxiety    Physical Exam:  Vital signs: BP 122/76   Pulse 76   Ht 5\' 3"  (1.6 m)   Wt 238 lb 2 oz (108 kg)   BMI 42.18 kg/m   Constitutional: NAD, alert and cooperative.  Obese and wheelchair-bound Head:  Normocephalic and atraumatic. Eyes:   PEERL, EOMI. No icterus. Conjunctiva pink. Respiratory: Respirations even and unlabored. Lungs clear to auscultation bilaterally.   No wheezes, crackles, or rhonchi.  Cardiovascular:  Regular rate and rhythm. No peripheral edema, cyanosis or pallor.  Gastrointestinal: Soft, mild distention, hypoactive bowel sounds, nontender Rectal:  Declines Msk:  Symmetrical without gross deformities. Without edema, no deformity or joint abnormality.  Neurologic:  Alert and  oriented x4;  grossly normal neurologically.  Skin:   Dry and intact without significant lesions or rashes. Psychiatric: Oriented to person, place and time. Demonstrates good judgement and reason without abnormal affect or behaviors.   RELEVANT LABS AND IMAGING: CBC    Component Value Date/Time   WBC 11.4 (H) 07/24/2023 1224   RBC 3.91 07/24/2023 1224   HGB 11.6 (L) 12/24/2023 0607   HGB 8.8 (L) 06/30/2021 1226   HGB 12.5 07/08/2008 1551   HCT 34.0 (L) 12/24/2023 0607   HCT 28.3 (L) 06/30/2021 1226   HCT 37.3 07/08/2008 1551   PLT 223 07/24/2023 1224   PLT 304 06/30/2021 1226   MCV 93.6 07/24/2023 1224   MCV 89 06/30/2021 1226  MCV 84.7 07/08/2008 1551   MCH 28.9 07/24/2023 1224   MCHC 30.9  07/24/2023 1224   RDW 15.0 07/24/2023 1224   RDW 13.6 06/30/2021 1226   RDW 13.0 07/08/2008 1551   LYMPHSABS 1.5 07/24/2023 1224   LYMPHSABS 2.5 02/23/2021 1523   LYMPHSABS 4.6 (H) 07/08/2008 1551   MONOABS 1.2 (H) 07/24/2023 1224   MONOABS 0.6 07/08/2008 1551   EOSABS 0.4 07/24/2023 1224   EOSABS 0.5 (H) 02/23/2021 1523   BASOSABS 0.1 07/24/2023 1224   BASOSABS 0.1 02/23/2021 1523   BASOSABS 0.1 07/08/2008 1551    CMP     Component Value Date/Time   NA 136 12/24/2023 0607   NA 136 10/20/2021 1206   K 4.6 12/24/2023 0607   CL 95 (L) 12/24/2023 0607   CO2 23 07/24/2023 1224   GLUCOSE 179 (H) 12/24/2023 0607   BUN 19 12/24/2023 0607   BUN 36 (H) 10/20/2021 1206   CREATININE 5.10 (H) 12/24/2023 0607   CREATININE 0.73 08/17/2014 1641   CALCIUM  7.9 (L) 07/24/2023 1224   CALCIUM  8.3 (L) 09/07/2021 1129   PROT 7.4 07/24/2023 1224   PROT 6.6 06/30/2021 1226   ALBUMIN  3.2 (L) 07/24/2023 1224   ALBUMIN  3.5 (L) 06/30/2021 1226   AST 12 (L) 07/24/2023 1224   ALT 13 07/24/2023 1224   ALKPHOS 81 07/24/2023 1224   BILITOT 0.8 07/24/2023 1224   BILITOT 0.3 06/30/2021 1226   GFRNONAA 7 (L) 07/24/2023 1224   GFRAA 58 (L) 05/06/2020 1207     Assessment/Plan:   Nancy [name] is a 63 year old female history of ESRD on HD, type 2 diabetes insulin -dependent, wheelchair-bound, CVA (2020), CHF, CAD, bilateral BKA, PAD, prolonged QT interval gastroparesis who presents with nausea, cramping, and bloating associated with constipation.  RUQ pain RUQ pain worse with movement/bending.  Suspect musculoskeletal in nature. Normal LFTs - Salonpas and heating pad as needed  Bloating Constipation Lower abdominal pain Symptoms suggestive of IBS-C with bloating and lower abdominal pain associated with hard pellet-like stools/intermittent diarrhea likely overflow currently using stool softeners intermittently with no relief.  Mobility is a barrier as she is wheelchair-bound with bilateral BKA  likely worsening her constipation. CTAP WO contrast 2023 with constipation. - MiraLAX  1 capful daily adjust dose based on response - Increase water , increase fiber - Can use IBgard for pain, samples provided - Follow-up 12 weeks - If persistent symptoms will consider prescription strength medication such as Linzess versus Amitiza  Nausea Daily intermittent nausea and early satiety.  Likely diabetic gastroparesis versus worsening constipation.  Extensive discussion about gastroparesis.  EGD in 2021 for nausea was normal.  History of prolonged QT interval -- Discussed diet with small, frequent meals, soft diet with the patient.  -- Can refer to dietician and encouraged weight loss with better control of A1C Will need to optimize bowel regimen. Can consider trial of motegrity.  -- Avoid Zofran  and metoclopramide  due to history of prolonged QT interval  Colon cancer screening Reported colonoscopy sometime within the last 10 years at [facility].  We will try to obtain records. - If patient is due for repeat screening will need to be done at the hospital due to ESRD on HD and since she is wheelchair-bound - Await records - If normal colonoscopy consider Cologuard for screening as patient has multiple comorbidities that would put her at higher risk than the average population for colonoscopies  Normocytic anemia Stable normocytic anemia with Hgb 11.3 likely in the setting of ESRD  on HD.  No overt bleeding.  History of pancreatitis Reported history of gallstone pancreatitis in 2021 with CTAP WO contrast in 2023 showing mild atrophy of pancreas and s/p cholecystectomy. No symptoms at this time.  CVA (2020) Not on anticoagulation  Diabetes ESRD on HD  CHF EF 55 to 60% 10/2023  Bilateral BKA Prolonged QT interval PAD  Gurdeep Keesey [facility] 05/14/2024, 8:56 AM  Cc: Lawrance Presume, MD

## 2024-05-14 NOTE — Patient Instructions (Addendum)
 Start taking Miralax  1 capful (17 grams) 1x / day for 1 week.   If this is not effective, increase to 1 dose 2x / day for 1 week.   If this is still not effective, increase to two capfuls (34 grams) 2x / day.   Can adjust dose as needed based on response. Can take 1/2 cap daily, skip days, or increase per day.    Follow up in 6 months  If your blood pressure at your visit was 140/90 or greater, please contact your primary care physician to follow up on this.  _______________________________________________________  If you are age 63 or older, your body mass index should be between 23-30. Your Body mass index is 42.18 kg/m. If this is out of the aforementioned range listed, please consider follow up with your Primary Care Provider.  If you are age 80 or younger, your body mass index should be between 19-25. Your Body mass index is 42.18 kg/m. If this is out of the aformentioned range listed, please consider follow up with your Primary Care Provider.   ________________________________________________________  The [facility] providers would like to encourage you to use MYCHART to communicate with providers for non-urgent requests or questions.  Due to long hold times on the telephone, sending your provider a message by [facility] may be a faster and more efficient way to get a response.  Please allow 48 business hours for a response.  Please remember that this is for non-urgent requests.  _______________________________________________________  Thank you for entrusting me with your care and choosing [facility].  Bayley Luan Rumpf, PA-C

## 2024-05-14 NOTE — Progress Notes (Signed)
 Addendum: Reviewed and agree with assessment and management plan. Asha Grumbine, Carie Caddy, MD

## 2024-05-19 ENCOUNTER — Other Ambulatory Visit (HOSPITAL_COMMUNITY): Payer: Self-pay

## 2024-05-19 ENCOUNTER — Other Ambulatory Visit (HOSPITAL_COMMUNITY): Payer: Self-pay | Admitting: Emergency Medicine

## 2024-05-19 NOTE — Progress Notes (Unsigned)
 Paramedicine Encounter    Patient ID: Nancy [name], female    DOB: 1961-09-18, 63 y.o.   MRN: [id number]   Complaints stomach cramps, nausea, constipation  Assessment A&O x 4, skin W&D w/ good color.  Denies chest pain or SOB.  Compliance with meds YES  Pill box filled x 1 week  Refills needed Xanax   Meds changes since last visit  Ibgard given by [employer]. (OTC)   Social changes NONE   BP 130/64 (BP Location: Right Arm, Patient Position: Sitting, Cuff Size: Normal)   Pulse 76   Resp 16   Wt 236 lb (107 kg)   SpO2 93%   BMI 41.81 kg/m  Weight yesterday- not taken Last visit weight-238lb  ACTION: Home visit completed  Carlton Chick [phone] 05/19/24  Patient Care Team: Lawrance Presume, MD as PCP - General (Internal Medicine) Avanell Leigh, MD as PCP - Cardiology (Cardiology) Darlis Eisenmenger, MD as PCP - Advanced Heart Failure (Cardiology) Center, [facility]  Patient Active Problem List   Diagnosis Date Noted  . S/P bilateral BKA (below knee amputation) (HCC) 02/04/2024  . History of CVA (cerebrovascular accident) 03/22/2023  . Community acquired pneumonia 03/22/2023  . Pain, unspecified 08/15/2022  . Complication of vascular dialysis catheter 08/07/2022  . Chest pain 05/30/2022  . Fluid overload 05/30/2022  . Iron  deficiency anemia, unspecified 04/26/2022  . Nausea 04/10/2022  . Hyponatremia 04/04/2022  . Prolonged QT interval 04/04/2022  . Anaphylactic shock, unspecified, initial encounter 02/23/2022  . Other allergy, initial encounter 02/23/2022  . Pressure injury of skin 02/19/2022  . Anemia in chronic kidney disease 02/16/2022  . 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 Hyperlipidemia, unspecified 02/16/2022  . Hypertensive heart and chronic kidney disease with heart failure and with stage 5 chronic kidney disease, or end stage renal disease (HCC) 02/16/2022  . Personal history of nicotine dependence  02/16/2022  . Personal history of transient ischemic attack (TIA), and cerebral infarction without residual deficits 02/16/2022  . Secondary hyperparathyroidism of renal origin (HCC) 02/16/2022  . Renal anasarca 02/14/2022  . ESRD on dialysis (HCC) 02/14/2022  . Hyperkalemia 02/14/2022  . S/P arteriovenous (AV) fistula creation 12/22/2021  . Peripheral artery disease (HCC) 10/27/2021  . Acute exacerbation of congestive heart failure (HCC) 10/24/2021  . Acute cardiogenic pulmonary edema (HCC) 10/06/2021  . CAD (coronary artery disease) 10/06/2021  . Goals of care, counseling/discussion   . Gangrene of left foot (HCC)   . Cellulitis of left foot   . CHF (congestive heart failure) (HCC) 07/07/2021  . Acute postoperative anemia due to expected blood loss superimposed on anemia of chronic renal insufficiency 06/30/2021  . CKD (chronic kidney disease), stage IV (HCC) 06/11/2021  . Chronic diastolic CHF (congestive heart failure) (HCC) 06/10/2021  . Abnormal nuclear stress test   . Dyspnea on exertion 03/07/2021  . Orthopnea 02/25/2021  . History of cerebrovascular accident (CVA) with residual deficit 05/06/2020  . AKI (acute kidney injury) (HCC) 04/20/2020  . Generalized weakness 04/20/2020  . Dehydration 04/20/2020  . Generalized abdominal pain   . Pancreatitis, acute 02/29/2020  . Cerebrovascular accident (CVA) (HCC) 11/08/2019  . Stenosis of right carotid artery 11/08/2019  . Cortical age-related cataract of both eyes 08/30/2019  . Gait abnormality 08/30/2019  . Statin declined 08/30/2019  . Gastroesophageal reflux disease without esophagitis 04/18/2018  . 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 Insulin  dependent type 2 diabetes mellitus (HCC) 10/29/2017  . History of macular degeneration 10/29/2017  . Chronic  cervical pain 10/29/2016  . Myalgia 09/14/2016  . Insomnia 09/14/2016  . Tinea pedis of both feet 08/20/2016  . Macular degeneration 08/17/2016  . Low back pain 09/21/2014  .  Hypertensive urgency 09/21/2014  . HTN (hypertension) 09/08/2014  . Thyroid  nodule 08/17/2014  . Morbid obesity (HCC) 08/17/2014  . Hypokalemia 08/06/2014  . Type II diabetes mellitus with neurological manifestations, uncontrolled 08/04/2014    Current Outpatient Medications:  .  acetaminophen  (TYLENOL ) 500 MG tablet, Take 1,000 mg by mouth every 8 (eight) hours as needed for mild pain (pain score 1-3), moderate pain (pain score 4-6) or headache., Disp: , Rfl:  .  albuterol  (VENTOLIN  HFA) 108 (90 Base) MCG/ACT inhaler, Inhale 2 puffs into the lungs every 6 (six) hours as needed., Disp: 18 g, Rfl: 3 .  ALPRAZolam  (XANAX ) 0.25 MG tablet, Take 1 tablet (0.25 mg total) by mouth on Monday, Wednesday and Friday at Hemodialysis., Disp: 12 tablet, Rfl: 2 .  amLODipine  (NORVASC ) 10 MG tablet, Take 1 tablet (10 mg total) by mouth daily for hypertension, Disp: 90 tablet, Rfl: 1 .  aspirin  81 MG EC tablet, Take 1 tablet (81 mg total) by mouth daily., Disp: 60 tablet, Rfl: 1 .  atorvastatin  (LIPITOR) 40 MG tablet, Take 1 tablet (40 mg total) by mouth daily for cholesterol., Disp: 90 tablet, Rfl: 1 .  Continuous Glucose Receiver (FREESTYLE LIBRE 3 READER) DEVI, Use to check blood glucose continuously. Dx code: E11.59, Z79.4, Disp: 1 each, Rfl: 0 .  Continuous Glucose Sensor (FREESTYLE LIBRE 3 PLUS SENSOR) MISC, Change sensor every 15 days. E11.59, Z79.4, Disp: 2 each, Rfl: 11 .  ezetimibe  (ZETIA ) 10 MG tablet, Take 1 tablet (10 mg total) by mouth daily for cholesterol., Disp: 90 tablet, Rfl: 1 .  insulin  glargine (LANTUS  SOLOSTAR) 100 UNIT/ML Solostar Pen, Inject 3 Units into the skin 2 (two) times daily., Disp: 15 mL, Rfl: PRN .  Insulin  Pen Needle (PEN NEEDLES) 31G X 8 MM MISC, Use for injection with insulin  pen daily., Disp: 100 each, Rfl: 6 .  isosorbide  mononitrate (IMDUR ) 30 MG 24 hr tablet, Take 2 tablets (60 mg total) by mouth daily for blood pressure., Disp: 180 tablet, Rfl: 1 .  labetalol   (NORMODYNE ) 300 MG tablet, Take 1 tablet (300 mg total) by mouth 2 (two) times daily., Disp: 180 tablet, Rfl: 1 .  levothyroxine  (SYNTHROID ) 25 MCG tablet, Take 1 tablet (25 mcg total) by mouth daily for low thyroid  hormone., Disp: 90 tablet, Rfl: 1 .  omeprazole  (PRILOSEC) 20 MG capsule, Take 2 capsules (40 mg total) by mouth daily for acid reflux., Disp: 180 capsule, Rfl: 1 .  polyethylene glycol powder (GLYCOLAX /MIRALAX ) 17 GM/SCOOP powder, Mix 1 capful with 8 ounces of fluid and take by mouth daily as needed., Disp: 3350 g, Rfl: 1 .  sevelamer  carbonate (RENVELA ) 800 MG tablet, Take 3 tablets (2,400 mg total) by mouth 3 (three) times daily with meals and 1 tablet with snacks., Disp: 1080 tablet, Rfl: 3 .  Accu-Chek Softclix Lancets lancets, Use to check blood sugar 3 times daily. (Patient not taking: Reported on 05/19/2024), Disp: 100 each, Rfl: 6 .  Blood Glucose Monitoring Suppl (ACCU-CHEK GUIDE) w/Device KIT, Use to check blood sugar 3 times daily. (Patient not taking: Reported on 05/19/2024), Disp: 1 kit, Rfl: 0 .  glucose blood (ACCU-CHEK GUIDE) test strip, Use to check blood sugar 3 times daily. (Patient not taking: Reported on 05/19/2024), Disp: 100 each, Rfl: 6 .  Multiple Vitamin (MULTI-VITAMIN DAILY) TABS,  Take 1 tablet by mouth at bedtime for supplementation (Patient not taking: Reported on 05/19/2024), Disp: 14 tablet, Rfl: 0 .  ondansetron  (ZOFRAN ) 4 MG tablet, Take 1 tablet (4 mg total) by mouth every 8 (eight) hours as needed for nausea. (Patient not taking: Reported on 05/19/2024), Disp: 90 tablet, Rfl: 3 .  Zinc  220 (50 Zn) MG CAPS, Take 1 capsule by mouth daily for minerals, electrolytes (Patient not taking: Reported on 03/24/2024), Disp: 14 capsule, Rfl: 0 Allergies  Allergen Reactions  . Hydralazine  Other (See Comments)  . Hydralazine  Hcl Other (See Comments)    Hair loss  . Hydrocodone Itching and Other (See Comments)    Upset stomach  . Metformin  And Related Nausea And Vomiting and  Other (See Comments)    Stomach pains, also  . Other Nausea Only and Other (See Comments)    Lettuce- Does not digest this!!  . Plaquenil [Hydroxychloroquine Sulfate] Hives  . Shellfish Allergy Nausea And Vomiting  . Shellfish-Derived Products Nausea Only and Other (See Comments)    Caused an upset stomach  . Shrimp (Diagnostic) Nausea Only and Other (See Comments)    Upset stomach   . Sulfa Antibiotics Hives  . Sulfur  Hives     Social History   Socioeconomic History  . Marital status: Legally Separated    Spouse name: Porfirio Bristol  . Number of children: 1  . Years of education: some colle  . Highest education level: Not on file  Occupational History  . Occupation: Employed FT as Warden/ranger    Comment: [employer] , NA  Tobacco Use  . Smoking status: Former    Current packs/day: 0.25    Average packs/day: 0.3 packs/day for 0.5 years (0.1 ttl pk-yrs)    Types: Cigarettes    Passive exposure: Never  . Smokeless tobacco: Never  . Tobacco comments:    Former smoker 11/03/21  Vaping Use  . Vaping status: Never Used  Substance and Sexual Activity  . Alcohol use: No  . Drug use: No  . Sexual activity: Not Currently    Birth control/protection: None  Other Topics Concern  . 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 Difficulty of Paying Living Expenses: Not hard at all  Food Insecurity: No Food Insecurity (02/04/2024)   Hunger Vital Sign   . Worried About Programme researcher, broadcasting/film/video in the Last Year: Never true   . Ran Out of Food in the Last Year: Never true  Transportation Needs: No Transportation Needs (02/04/2024)   PRAPARE - Transportation   . Lack of Transportation (Medical): No   . Lack of Transportation (Non-Medical): No  Physical Activity: Inactive (02/04/2024)   Exercise Vital  Sign   . Days of Exercise per Week: 0 days   . Minutes of Exercise per Session: 0 min  Stress: No Stress Concern Present (02/04/2024)   [employer] of Occupational Health - Occupational Stress Questionnaire   . Feeling of Stress : Not at all  Social Connections: Moderately Integrated (02/04/2024)   Social Connection and Isolation Panel [NHANES]   . Frequency of Communication with Friends and Family: Twice a week   . Frequency of Social Gatherings with Friends and Family: Once a week   . Attends Religious Services: Never   . Active Member of  Clubs or Organizations: Yes   . Attends Banker Meetings: Never   . Marital Status: Married  Catering manager Violence: Not At Risk (02/04/2024)   Humiliation, Afraid, Rape, and Kick questionnaire   . Fear of Current or Ex-Partner: No   . Emotionally Abused: No   . Physically Abused: No   . Sexually Abused: No    Physical Exam      Future Appointments  Date Time Provider Department Center  06/04/2024 11:10 AM Lawrance Presume, MD [facility] None  06/22/2024  2:20 PM Jorge Newcomer, MD [facility] None  09/17/2024 11:00 AM [facility]  09/17/2024 11:40 AM Kayla Part, MD [facility]

## 2024-05-26 ENCOUNTER — Other Ambulatory Visit (HOSPITAL_COMMUNITY): Payer: Self-pay | Admitting: Emergency Medicine

## 2024-05-26 NOTE — Progress Notes (Unsigned)
 Today med rec only at pt's request.  No refills needed at this time.  Will discuss pull packs at next visit.    Ermalinda Hays, EMT-Paramedic [phone] 05/26/2024

## 2024-06-02 ENCOUNTER — Other Ambulatory Visit (HOSPITAL_COMMUNITY): Payer: Self-pay | Admitting: Emergency Medicine

## 2024-06-02 NOTE — Progress Notes (Signed)
  Today's visit for med rec only.  Pt's box filled x 1 week w/o incident.  Refills for [employer] sensor called in to [facility].    Ermalinda Hays, EMT-Paramedic [phone] 06/03/2024

## 2024-06-04 ENCOUNTER — Ambulatory Visit: Payer: Medicare Other | Attending: Internal Medicine | Admitting: Internal Medicine

## 2024-06-04 ENCOUNTER — Encounter: Payer: Self-pay | Admitting: Internal Medicine

## 2024-06-04 ENCOUNTER — Other Ambulatory Visit: Payer: Self-pay

## 2024-06-04 ENCOUNTER — Ambulatory Visit: Admitting: Gastroenterology

## 2024-06-04 ENCOUNTER — Other Ambulatory Visit (HOSPITAL_COMMUNITY): Payer: Self-pay

## 2024-06-04 VITALS — BP 104/63 | HR 80 | Ht 63.0 in | Wt 246.0 lb

## 2024-06-04 DIAGNOSIS — E1143 Type 2 diabetes mellitus with diabetic autonomic (poly)neuropathy: Secondary | ICD-10-CM | POA: Diagnosis not present

## 2024-06-04 DIAGNOSIS — Z89511 Acquired absence of right leg below knee: Secondary | ICD-10-CM | POA: Diagnosis not present

## 2024-06-04 DIAGNOSIS — Z23 Encounter for immunization: Secondary | ICD-10-CM

## 2024-06-04 DIAGNOSIS — I10 Essential (primary) hypertension: Secondary | ICD-10-CM | POA: Diagnosis not present

## 2024-06-04 DIAGNOSIS — Z794 Long term (current) use of insulin: Secondary | ICD-10-CM | POA: Diagnosis not present

## 2024-06-04 DIAGNOSIS — K581 Irritable bowel syndrome with constipation: Secondary | ICD-10-CM | POA: Insufficient documentation

## 2024-06-04 DIAGNOSIS — Z95810 Presence of automatic (implantable) cardiac defibrillator: Secondary | ICD-10-CM | POA: Diagnosis not present

## 2024-06-04 DIAGNOSIS — E039 Hypothyroidism, unspecified: Secondary | ICD-10-CM | POA: Diagnosis not present

## 2024-06-04 DIAGNOSIS — E1159 Type 2 diabetes mellitus with other circulatory complications: Secondary | ICD-10-CM | POA: Diagnosis not present

## 2024-06-04 DIAGNOSIS — E1122 Type 2 diabetes mellitus with diabetic chronic kidney disease: Secondary | ICD-10-CM | POA: Diagnosis not present

## 2024-06-04 DIAGNOSIS — D631 Anemia in chronic kidney disease: Secondary | ICD-10-CM | POA: Diagnosis not present

## 2024-06-04 DIAGNOSIS — Z992 Dependence on renal dialysis: Secondary | ICD-10-CM | POA: Insufficient documentation

## 2024-06-04 DIAGNOSIS — I272 Pulmonary hypertension, unspecified: Secondary | ICD-10-CM | POA: Diagnosis not present

## 2024-06-04 DIAGNOSIS — F418 Other specified anxiety disorders: Secondary | ICD-10-CM | POA: Diagnosis not present

## 2024-06-04 DIAGNOSIS — E1142 Type 2 diabetes mellitus with diabetic polyneuropathy: Secondary | ICD-10-CM | POA: Insufficient documentation

## 2024-06-04 DIAGNOSIS — Z79890 Hormone replacement therapy (postmenopausal): Secondary | ICD-10-CM | POA: Insufficient documentation

## 2024-06-04 DIAGNOSIS — N186 End stage renal disease: Secondary | ICD-10-CM | POA: Diagnosis not present

## 2024-06-04 DIAGNOSIS — K3184 Gastroparesis: Secondary | ICD-10-CM | POA: Insufficient documentation

## 2024-06-04 DIAGNOSIS — I5032 Chronic diastolic (congestive) heart failure: Secondary | ICD-10-CM | POA: Insufficient documentation

## 2024-06-04 DIAGNOSIS — I132 Hypertensive heart and chronic kidney disease with heart failure and with stage 5 chronic kidney disease, or end stage renal disease: Secondary | ICD-10-CM | POA: Insufficient documentation

## 2024-06-04 DIAGNOSIS — I152 Hypertension secondary to endocrine disorders: Secondary | ICD-10-CM | POA: Diagnosis not present

## 2024-06-04 DIAGNOSIS — I2510 Atherosclerotic heart disease of native coronary artery without angina pectoris: Secondary | ICD-10-CM | POA: Insufficient documentation

## 2024-06-04 DIAGNOSIS — Z89512 Acquired absence of left leg below knee: Secondary | ICD-10-CM | POA: Diagnosis not present

## 2024-06-04 DIAGNOSIS — E785 Hyperlipidemia, unspecified: Secondary | ICD-10-CM | POA: Insufficient documentation

## 2024-06-04 DIAGNOSIS — Z8673 Personal history of transient ischemic attack (TIA), and cerebral infarction without residual deficits: Secondary | ICD-10-CM | POA: Insufficient documentation

## 2024-06-04 DIAGNOSIS — I6523 Occlusion and stenosis of bilateral carotid arteries: Secondary | ICD-10-CM | POA: Diagnosis not present

## 2024-06-04 DIAGNOSIS — D649 Anemia, unspecified: Secondary | ICD-10-CM

## 2024-06-04 DIAGNOSIS — E119 Type 2 diabetes mellitus without complications: Secondary | ICD-10-CM | POA: Diagnosis present

## 2024-06-04 LAB — GLUCOSE, POCT (MANUAL RESULT ENTRY): POC Glucose: 296 mg/dL — AB (ref 70–99)

## 2024-06-04 LAB — POCT GLYCOSYLATED HEMOGLOBIN (HGB A1C): HbA1c, POC (controlled diabetic range): 7.7 % — AB (ref 0.0–7.0)

## 2024-06-04 MED ORDER — AMLODIPINE BESYLATE 10 MG PO TABS
10.0000 mg | ORAL_TABLET | Freq: Every day | ORAL | 1 refills | Status: DC
  Filled 2024-06-04 – 2024-09-03 (×2): qty 90, 90d supply, fill #0
  Filled 2024-09-03: qty 90, 90d supply, fill #1
  Filled 2024-09-15 – 2024-09-18 (×2): qty 90, 90d supply, fill #0
  Filled ????-??-??: fill #1

## 2024-06-04 MED ORDER — LABETALOL HCL 300 MG PO TABS
300.0000 mg | ORAL_TABLET | Freq: Two times a day (BID) | ORAL | 1 refills | Status: DC
  Filled 2024-06-04 – 2024-08-10 (×3): qty 180, 90d supply, fill #0
  Filled 2024-11-06: qty 180, 90d supply, fill #1
  Filled ????-??-??: fill #0

## 2024-06-04 MED ORDER — ZOSTER VAC RECOMB ADJUVANTED 50 MCG/0.5ML IM SUSR
0.5000 mL | Freq: Once | INTRAMUSCULAR | 0 refills | Status: AC
Start: 2024-06-04 — End: 2024-06-04

## 2024-06-04 MED ORDER — EZETIMIBE 10 MG PO TABS
10.0000 mg | ORAL_TABLET | Freq: Every day | ORAL | 1 refills | Status: DC
  Filled 2024-06-04 – 2024-08-10 (×3): qty 90, 90d supply, fill #0
  Filled 2024-11-06: qty 90, 90d supply, fill #1
  Filled ????-??-??: fill #0

## 2024-06-04 MED ORDER — LEVOTHYROXINE SODIUM 25 MCG PO TABS
25.0000 ug | ORAL_TABLET | Freq: Every day | ORAL | 1 refills | Status: DC
  Filled 2024-06-04 – 2024-08-10 (×3): qty 90, 90d supply, fill #0
  Filled 2024-11-06: qty 90, 90d supply, fill #1
  Filled ????-??-??: fill #0

## 2024-06-04 MED ORDER — ISOSORBIDE MONONITRATE ER 30 MG PO TB24
60.0000 mg | ORAL_TABLET | Freq: Every day | ORAL | 1 refills | Status: DC
  Filled 2024-06-04 – 2024-08-10 (×3): qty 180, 90d supply, fill #0
  Filled 2024-11-06: qty 180, 90d supply, fill #1
  Filled ????-??-??: fill #0

## 2024-06-04 MED ORDER — ATORVASTATIN CALCIUM 40 MG PO TABS
40.0000 mg | ORAL_TABLET | Freq: Every day | ORAL | 1 refills | Status: DC
  Filled 2024-06-04 – 2024-08-10 (×3): qty 90, 90d supply, fill #0
  Filled 2024-11-06: qty 90, 90d supply, fill #1
  Filled ????-??-??: fill #0

## 2024-06-04 NOTE — Progress Notes (Addendum)
 Patient ID: Nancy [name], female    DOB: 1961-07-22  MRN: [id number]  CC: chronic ds management Diabetes (DM f/u. /No questions . concerns)   Subjective: Nancy [name] is a 63 y.o. female who presents for chronic ds management. Husband is with her. 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    Discussed the use of AI scribe software for clinical note transcription with the patient, who gave verbal consent to proceed.  History of Present Illness Nancy [name] is a 63 year old female with end-stage renal disease who presents for a four-month follow-up visit. She is accompanied by her husband.  ESRD/HTN/CHF: She undergoes dialysis three times a week and takes Xanax  prior to sessions to manage panic attacks, which cause a sensation of heart racing. She denies chest pain or shortness of breath. Her blood pressure is controlled with labetalol , amlodipine , and isosorbide . She has congestive heart failure.  DM: Results for orders placed or performed in visit on 06/04/24  POCT glucose (manual entry)   Collection Time: 06/04/24 11:43 AM  Result Value Ref Range   POC Glucose 296 (A) 70 - 99 mg/dl  POCT glycosylated hemoglobin (Hb A1C)   Collection Time: 06/04/24 11:49 AM  Result Value Ref Range   Hemoglobin A1C     HbA1c POC (<> result, manual entry)     HbA1c, POC (prediabetic range)     HbA1c, POC (controlled diabetic range) 7.7 (A) 0.0 - 7.0 %  Her diabetes is managed with Humalog  70/30 insulin  3 units BID. She has continuous glucose monitor, though she did not bring the reader today. Her recent  A1c is 7.7. She maintains good dietary habits but consumes cranberry juice, which may affect her glucose levels. - Referred to [facility] for eye exam.  They have not been able to reach her.  She takes atorvastatin  and Zetia  for cholesterol and aspirin  daily for HL and hx of CVA.   She has hypothyroidism managed with levothyroxine .   Saw GI for bloating, constipation and abdominal pain.diagnosed with having likely IBS-C. she uses Miralax  and dietary fiber for constipation with good results.  Still has not get home care/PCS services set up.  States that she had called the number several times but never heard back from anybody.  She is enrolled in the EMT community program.  EMT checks on her once a week.  HM: [facility] wants to schedule her for colonoscopy but wanted to get results of previous colonoscopy first.  Patient thinks she had it done through [facility] gastroenterology more than 10 years ago.  Given prescription for Shingrix vaccine on last visit but forgot to take it downstairs to the pharmacy.  She was still wants to get it done.  .    Patient Active Problem List   Diagnosis Date Noted   S/P bilateral BKA (below knee amputation) (HCC) 02/04/2024   History of CVA (cerebrovascular accident) 03/22/2023   Community acquired pneumonia 03/22/2023   Pain, unspecified 08/15/2022   Complication of vascular dialysis catheter 08/07/2022   Chest pain 05/30/2022   Fluid overload 05/30/2022   Iron  deficiency anemia, unspecified 04/26/2022   Nausea 04/10/2022  Hyponatremia 04/04/2022   Prolonged QT interval 04/04/2022   Anaphylactic shock, unspecified, initial encounter 02/23/2022   Other allergy, initial encounter 02/23/2022   Pressure injury of skin 02/19/2022   Anemia in chronic kidney disease 02/16/2022   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) 02/16/2022   Hyperlipidemia, unspecified 02/16/2022   Hypertensive heart and chronic kidney disease with heart failure and with stage 5 chronic  kidney disease, or end stage renal disease (HCC) 02/16/2022   Personal history of nicotine dependence 02/16/2022   Personal history of transient ischemic attack (TIA), and cerebral infarction without residual deficits 02/16/2022   Secondary hyperparathyroidism of renal origin (HCC) 02/16/2022   Renal anasarca 02/14/2022   ESRD on dialysis (HCC) 02/14/2022   Hyperkalemia 02/14/2022   S/P arteriovenous (AV) fistula creation 12/22/2021   Peripheral artery disease (HCC) 10/27/2021   Acute exacerbation of congestive heart failure (HCC) 10/24/2021   Acute cardiogenic pulmonary edema (HCC) 10/06/2021   CAD (coronary artery disease) 10/06/2021   Goals of care, counseling/discussion    Gangrene of left foot (HCC)    Cellulitis of left foot    CHF (congestive heart failure) (HCC) 07/07/2021   Acute postoperative anemia due to expected blood loss superimposed on anemia of chronic renal insufficiency 06/30/2021   CKD (chronic kidney disease), stage IV (HCC) 06/11/2021   Chronic diastolic CHF (congestive heart failure) (HCC) 06/10/2021   Abnormal nuclear stress test    Dyspnea on exertion 03/07/2021   Orthopnea 02/25/2021   History of cerebrovascular accident (CVA) with residual deficit 05/06/2020   AKI (acute kidney injury) (HCC) 04/20/2020   Generalized weakness 04/20/2020   Dehydration 04/20/2020   Generalized abdominal pain    Pancreatitis, acute 02/29/2020   Cerebrovascular accident (CVA) (HCC) 11/08/2019   Stenosis of right carotid artery 11/08/2019   Cortical age-related cataract of both eyes 08/30/2019   Gait abnormality 08/30/2019   Statin declined 08/30/2019   Gastroesophageal reflux disease without esophagitis 04/18/2018   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   Insulin  dependent type 2 diabetes mellitus (HCC) 10/29/2017   History of macular degeneration 10/29/2017   Chronic cervical pain 10/29/2016   Myalgia 09/14/2016   Insomnia 09/14/2016   Tinea pedis of both feet 08/20/2016   Macular  degeneration 08/17/2016   Low back pain 09/21/2014   Hypertensive urgency 09/21/2014   HTN (hypertension) 09/08/2014   Thyroid  nodule 08/17/2014   Morbid obesity (HCC) 08/17/2014   Hypokalemia 08/06/2014   Type II diabetes mellitus with neurological manifestations, uncontrolled 08/04/2014     Current Outpatient Medications on File Prior to Visit  Medication Sig Dispense Refill   acetaminophen  (TYLENOL ) 500 MG tablet Take 1,000 mg by mouth every 8 (eight) hours as needed for mild pain (pain score 1-3), moderate pain (pain score 4-6) or headache.     albuterol  (VENTOLIN  HFA) 108 (90 Base) MCG/ACT inhaler Inhale 2 puffs into the lungs every 6 (six) hours as needed. 18 g 3   ALPRAZolam  (XANAX ) 0.25 MG tablet Take 1 tablet (0.25 mg total) by mouth on Monday, Wednesday and Friday at Hemodialysis. 12 tablet 2   amLODipine  (NORVASC ) 10 MG tablet Take 1 tablet (10 mg total) by mouth daily for hypertension 90 tablet 1   aspirin  81 MG EC tablet Take 1 tablet (81 mg total) by mouth daily. 60 tablet 1   atorvastatin  (LIPITOR) 40 MG tablet Take 1 tablet (40 mg total) by mouth daily for cholesterol. 90 tablet 1   Continuous Glucose Receiver (FREESTYLE LIBRE 3 READER) DEVI Use to check blood glucose  continuously. Dx code: E11.59, Z79.4 1 each 0   Continuous Glucose Sensor (FREESTYLE LIBRE 3 PLUS SENSOR) MISC Change sensor every 15 days. E11.59, Z79.4 2 each 11   ezetimibe  (ZETIA ) 10 MG tablet Take 1 tablet (10 mg total) by mouth daily for cholesterol. 90 tablet 1   insulin  glargine (LANTUS  SOLOSTAR) 100 UNIT/ML Solostar Pen Inject 3 Units into the skin 2 (two) times daily. 15 mL PRN   Insulin  Pen Needle (PEN NEEDLES) 31G X 8 MM MISC Use for injection with insulin  pen daily. 100 each 6   isosorbide  mononitrate (IMDUR ) 30 MG 24 hr tablet Take 2 tablets (60 mg total) by mouth daily for blood pressure. 180 tablet 1   labetalol  (NORMODYNE ) 300 MG tablet Take 1 tablet (300 mg total) by mouth 2 (two) times daily.  180 tablet 1   levothyroxine  (SYNTHROID ) 25 MCG tablet Take 1 tablet (25 mcg total) by mouth daily for low thyroid  hormone. 90 tablet 1   omeprazole  (PRILOSEC) 20 MG capsule Take 2 capsules (40 mg total) by mouth daily for acid reflux. 180 capsule 1   polyethylene glycol powder (GLYCOLAX /MIRALAX ) 17 GM/SCOOP powder Mix 1 capful with 8 ounces of fluid and take by mouth daily as needed. 3350 g 1   sevelamer  carbonate (RENVELA ) 800 MG tablet Take 3 tablets (2,400 mg total) by mouth 3 (three) times daily with meals and 1 tablet with snacks. 1080 tablet 3   Accu-Chek Softclix Lancets lancets Use to check blood sugar 3 times daily. (Patient not taking: Reported on 06/04/2024) 100 each 6   Blood Glucose Monitoring Suppl (ACCU-CHEK GUIDE) w/Device KIT Use to check blood sugar 3 times daily. (Patient not taking: Reported on 06/04/2024) 1 kit 0   glucose blood (ACCU-CHEK GUIDE) test strip Use to check blood sugar 3 times daily. (Patient not taking: Reported on 06/04/2024) 100 each 6   Multiple Vitamin (MULTI-VITAMIN DAILY) TABS Take 1 tablet by mouth at bedtime for supplementation (Patient not taking: Reported on 06/04/2024) 14 tablet 0   ondansetron  (ZOFRAN ) 4 MG tablet Take 1 tablet (4 mg total) by mouth every 8 (eight) hours as needed for nausea. (Patient not taking: Reported on 06/04/2024) 90 tablet 3   Zinc  220 (50 Zn) MG CAPS Take 1 capsule by mouth daily for minerals, electrolytes (Patient not taking: Reported on 06/04/2024) 14 capsule 0   [DISCONTINUED] Insulin  NPH Isophane & Regular (RELION 70/30 [place]) Inject 35 Units into the skin 2 (two) times daily.     No current facility-administered medications on file prior to visit.    Allergies  Allergen Reactions   Hydralazine  Other (See Comments)   Hydralazine  Hcl Other (See Comments)    Hair loss   Hydrocodone Itching and Other (See Comments)    Upset stomach   Metformin  And Related Nausea And Vomiting and Other (See Comments)    Stomach pains, also    Other Nausea Only and Other (See Comments)    Lettuce- Does not digest this!!   Plaquenil [Hydroxychloroquine Sulfate] Hives   Shellfish Allergy Nausea And Vomiting   Shellfish-Derived Products Nausea Only and Other (See Comments)    Caused an upset stomach   Shrimp (Diagnostic) Nausea Only and Other (See Comments)    Upset stomach    Sulfa Antibiotics Hives   Sulfur  Hives    Social History   Socioeconomic History   Marital status: Legally Separated    Spouse name: Porfirio Bristol   Number of children: 1   Years of education: some colle  Highest education level: Not on file  Occupational History   Occupation: Employed FT as Warden/ranger    Comment: [employer] , NA  Tobacco Use   Smoking status: Former    Current packs/day: 0.25    Average packs/day: 0.3 packs/day for 0.5 years (0.1 ttl pk-yrs)    Types: Cigarettes    Passive exposure: Never   Smokeless tobacco: Never   Tobacco comments:    Former smoker 11/03/21  Vaping Use   Vaping status: Never Used  Substance and Sexual Activity   Alcohol use: No   Drug use: No   Sexual activity: Not Currently    Birth control/protection: None  Other Topics Concern   Not on file  Social History Narrative   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    Social Drivers of Health   Financial Resource Strain: Low Risk  (02/04/2024)   Overall Financial Resource Strain (CARDIA)    Difficulty of Paying Living Expenses: Not hard at all  Food Insecurity: No Food Insecurity (02/04/2024)   Hunger Vital Sign    Worried About Running Out of Food in the Last Year: Never true    Ran Out of Food in the Last Year: Never true  Transportation Needs: No Transportation Needs (02/04/2024)   PRAPARE - Administrator, Civil Service (Medical): No    Lack of Transportation (Non-Medical): No  Physical Activity: Inactive (02/04/2024)   Exercise Vital Sign    Days of Exercise per Week: 0 days    Minutes of Exercise per  Session: 0 min  Stress: No Stress Concern Present (02/04/2024)   [employer] of Occupational Health - Occupational Stress Questionnaire    Feeling of Stress : Not at all  Social Connections: Moderately Integrated (02/04/2024)   Social Connection and Isolation Panel    Frequency of Communication with Friends and Family: Twice a week    Frequency of Social Gatherings with Friends and Family: Once a week    Attends Religious Services: Never    Database administrator or Organizations: Yes    Attends Banker Meetings: Never    Marital Status: Married  Catering manager Violence: Not At Risk (02/04/2024)   Humiliation, Afraid, Rape, and Kick questionnaire    Fear of Current or Ex-Partner: No    Emotionally Abused: No    Physically Abused: No    Sexually Abused: No    Family History  Problem Relation Age of Onset   Hypertension Mother    Heart disease Mother    Diabetes Mother    Thyroid  disease Mother    Congestive Heart Failure Mother    Breast cancer Maternal Grandmother    Colon cancer Maternal Grandfather    Heart attack Sister    Heart disease Brother    Hyperlipidemia Brother    Hypertension Brother    Diabetes Father    Breast cancer Maternal Aunt     Past Surgical History:  Procedure Laterality Date   A/V FISTULAGRAM Right 09/25/2022   Procedure: A/V Fistulagram;  Surgeon: Margherita Shell, MD;  Location: [facility] INVASIVE CV LAB;  Service: Cardiovascular;  Laterality: Right;   ABDOMINAL HYSTERECTOMY  2005   AMPUTATION Bilateral 04/04/2022   Procedure: BILATERAL BELOW KNEE AMPUTATION;  Surgeon: Timothy Ford, MD;  Location: [facility] OR;  Service: Orthopedics;  Laterality: Bilateral;   AV FISTULA PLACEMENT Right 12/22/2021   Procedure: RIGHT ARM ARTERIOVENOUS (AV) BRACIOCPHELAIC FISTULA CREATION;  Surgeon: Adine Hoof, MD;  Location: [facility] OR;  Service: Vascular;  Laterality: Right;   AV FISTULA PLACEMENT Right 02/19/2022   Procedure: RIGHT ARTERIOVENOUS  FISTULA;  Surgeon: Dannis Dy, MD;  Location: [facility] OR;  Service: Vascular;  Laterality: Right;   AV FISTULA PLACEMENT Left 12/24/2023   Procedure: LEFT BRACHIOCEPHALIC ARTERIOVENOUS (AV) FISTULA CREATION;  Surgeon: Kayla Part, MD;  Location: [facility] OR;  Service: Vascular;  Laterality: Left;   BASCILIC VEIN TRANSPOSITION Right 08/07/2022   Procedure: SECOND STAGE RIGHT BASILIC VEIN TRANSPOSITION;  Surgeon: Dannis Dy, MD;  Location: [facility] OR;  Service: Vascular;  Laterality: Right;   CATARACT EXTRACTION Left 11/2019   CESAREAN SECTION  1983   CHOLECYSTECTOMY N/A 03/05/2020   Procedure: LAPAROSCOPIC CHOLECYSTECTOMY WITH INTRAOPERATIVE CHOLANGIOGRAM;  Surgeon: Dareen Ebbing, MD;  Location: [facility] ORS;  Service: General;  Laterality: N/A;   COLONOSCOPY     [facility] said no polyps were found   ESOPHAGOGASTRODUODENOSCOPY (EGD) WITH PROPOFOL  Left 08/26/2014   Procedure: ESOPHAGOGASTRODUODENOSCOPY (EGD) WITH PROPOFOL ;  Surgeon: Evangeline Hilts, MD;  Location: [facility] ENDOSCOPY;  Service: Endoscopy;  Laterality: Left;   ESOPHAGOGASTRODUODENOSCOPY (EGD) WITH PROPOFOL  N/A 03/03/2020   Procedure: ESOPHAGOGASTRODUODENOSCOPY (EGD) WITH PROPOFOL ;  Surgeon: Nannette Babe, MD;  Location: [facility] ENDOSCOPY;  Service: Gastroenterology;  Laterality: N/A;   IR FLUORO GUIDE CV LINE RIGHT  02/15/2022   IR US  GUIDE VASC ACCESS RIGHT  02/15/2022   PERIPHERAL VASCULAR BALLOON ANGIOPLASTY Right 09/25/2022   Procedure: PERIPHERAL VASCULAR BALLOON ANGIOPLASTY;  Surgeon: Margherita Shell, MD;  Location: [facility];  Service: Cardiovascular;  Laterality: Right;  AVF   RIGHT HEART CATH N/A 07/14/2021   Procedure: RIGHT HEART CATH;  Surgeon: Darlis Eisenmenger, MD;  Location: [facility] INVASIVE CV LAB;  Service: Cardiovascular;  Laterality: N/A;   RIGHT/LEFT HEART CATH AND CORONARY ANGIOGRAPHY N/A 04/27/2021   Procedure: RIGHT/LEFT HEART CATH AND CORONARY ANGIOGRAPHY;  Surgeon: Avanell Leigh, MD;  Location: [facility]  INVASIVE CV LAB;  Service: Cardiovascular;  Laterality: N/A;    ROS: Review of Systems Negative except as stated above  PHYSICAL EXAM: BP 104/63 (BP Location: Left Arm, Patient Position: Sitting, Cuff Size: Normal)   Pulse 80   Ht 5' 3 (1.6 m)   Wt 246 lb (111.6 kg)   SpO2 95%   BMI 43.58 kg/m   Physical Exam  General appearance - alert, well appearing, and in no distress Mental status - normal mood, behavior, speech, dress, motor activity, and thought processes Neck - supple, no significant adenopathy Chest - clear to auscultation, no wheezes, rales or rhonchi, symmetric air entry Heart - normal rate, regular rhythm, normal S1, S2, no murmurs, rubs, clicks or gallops Extremities -below-knee amputation bilaterally.  She is wearing prosthesis.  She is sitting in wheelchair.     Latest Ref Rng & Units 12/24/2023    6:07 AM 07/24/2023   12:24 PM 03/24/2023    3:28 AM  CMP  Glucose 70 - 99 mg/dL 130  865  784   BUN 8 - 23 mg/dL 19  27  33   Creatinine 0.44 - 1.00 mg/dL 6.96  2.95  2.84   Sodium 135 - 145 mmol/L 136  133  135   Potassium 3.5 - 5.1 mmol/L 4.6  4.2  4.8   Chloride 98 - 111 mmol/L 95  91  99   CO2 22 - 32 mmol/L  23  26   Calcium  8.9 - 10.3 mg/dL  7.9  8.2   Total Protein 6.5 -  8.1 g/dL  7.4  7.2   Total Bilirubin 0.3 - 1.2 mg/dL  0.8  0.8   Alkaline Phos 38 - 126 U/L  81  97   AST 15 - 41 U/L  12  19   ALT 0 - 44 U/L  13  14    Lipid Panel     Component Value Date/Time   CHOL 110 10/08/2023 1145   CHOL 193 02/11/2020 1103   TRIG 95 10/08/2023 1145   HDL 45 10/08/2023 1145   HDL 40 02/11/2020 1103   CHOLHDL 2.4 10/08/2023 1145   VLDL 19 10/08/2023 1145   LDLCALC 46 10/08/2023 1145   LDLCALC 122 (H) 02/11/2020 1103    CBC    Component Value Date/Time   WBC 11.4 (H) 07/24/2023 1224   RBC 3.91 07/24/2023 1224   HGB 11.6 (L) 12/24/2023 0607   HGB 8.8 (L) 06/30/2021 1226   HGB 12.5 07/08/2008 1551   HCT 34.0 (L) 12/24/2023 0607   HCT 28.3 (L)  06/30/2021 1226   HCT 37.3 07/08/2008 1551   PLT 223 07/24/2023 1224   PLT 304 06/30/2021 1226   MCV 93.6 07/24/2023 1224   MCV 89 06/30/2021 1226   MCV 84.7 07/08/2008 1551   MCH 28.9 07/24/2023 1224   MCHC 30.9 07/24/2023 1224   RDW 15.0 07/24/2023 1224   RDW 13.6 06/30/2021 1226   RDW 13.0 07/08/2008 1551   LYMPHSABS 1.5 07/24/2023 1224   LYMPHSABS 2.5 02/23/2021 1523   LYMPHSABS 4.6 (H) 07/08/2008 1551   MONOABS 1.2 (H) 07/24/2023 1224   MONOABS 0.6 07/08/2008 1551   EOSABS 0.4 07/24/2023 1224   EOSABS 0.5 (H) 02/23/2021 1523   BASOSABS 0.1 07/24/2023 1224   BASOSABS 0.1 02/23/2021 1523   BASOSABS 0.1 07/08/2008 1551    ASSESSMENT AND PLAN: 1. Type 2 diabetes mellitus with other circulatory complication, with long-term current use of insulin  (HCC) (Primary) A1c improved from 7.9 to 7.7. A1c may be inaccurate due to ESRD and anemia. Fructosamine level needed for accurate monitoring. - Order fructosamine level for more accurate diabetes monitoring. - Continue Humalog  70/30 insulin , 3 units twice a day. - Advise to avoid sugary drinks and snacks. - Bring continuous glucose monitor to appointments. Given information for [facility] so that she can call them and schedule her diabetic eye exam. - POCT glycosylated hemoglobin (Hb A1C) - POCT glucose (manual entry) - CBC - Fructosamine  2. Hypertension associated with diabetes (HCC) At goal.  Continue labetalol , isosorbide  and amlodipine  - labetalol  (NORMODYNE ) 300 MG tablet; Take 1 tablet (300 mg total) by mouth 2 (two) times daily.  Dispense: 180 tablet; Refill: 1 - isosorbide  mononitrate (IMDUR ) 30 MG 24 hr tablet; Take 2 tablets (60 mg total) by mouth daily for blood pressure.  Dispense: 180 tablet; Refill: 1 - amLODipine  (NORVASC ) 10 MG tablet; Take 1 tablet (10 mg total) by mouth daily for hypertension  Dispense: 90 tablet; Refill: 1  3. ESRD on dialysis (HCC) - CBC  4. Chronic diastolic CHF (congestive  heart failure) (HCC) Stable and compensated. - labetalol  (NORMODYNE ) 300 MG tablet; Take 1 tablet (300 mg total) by mouth 2 (two) times daily.  Dispense: 180 tablet; Refill: 1 - isosorbide  mononitrate (IMDUR ) 30 MG 24 hr tablet; Take 2 tablets (60 mg total) by mouth daily for blood pressure.  Dispense: 180 tablet; Refill: 1  5. Hypothyroidism (acquired) Continue levothyroxine  25 mg daily - TSH  6. Irritable bowel syndrome with constipation Continue MiraLAX  and  increase fiber in the diet.  Will have her sign release for us  to get last colonoscopy report from [facility].  7. Need for shingles vaccine Rxn given to take to pharmacy - Zoster Vaccine Adjuvanted [facility]) injection; Inject 0.5 mLs into the muscle once for 1 dose.  Dispense: 0.5 mL; Refill: 0  Addendum:  iron  studies added due to chronic anemia  Patient was given the opportunity to ask questions.  Patient verbalized understanding of the plan and was able to repeat key elements of the plan.   This documentation was completed using Paediatric nurse.  Any transcriptional errors are unintentional.  Orders Placed This Encounter  Procedures   POCT glycosylated hemoglobin (Hb A1C)   POCT glucose (manual entry)     Requested Prescriptions   Pending Prescriptions Disp Refills   levothyroxine  (SYNTHROID ) 25 MCG tablet 90 tablet 1    Sig: Take 1 tablet (25 mcg total) by mouth daily for low thyroid  hormone.   labetalol  (NORMODYNE ) 300 MG tablet 180 tablet 1    Sig: Take 1 tablet (300 mg total) by mouth 2 (two) times daily.   isosorbide  mononitrate (IMDUR ) 30 MG 24 hr tablet 180 tablet 1    Sig: Take 2 tablets (60 mg total) by mouth daily for blood pressure.   ezetimibe  (ZETIA ) 10 MG tablet 90 tablet 1    Sig: Take 1 tablet (10 mg total) by mouth daily for cholesterol.   atorvastatin  (LIPITOR) 40 MG tablet 90 tablet 1    Sig: Take 1 tablet (40 mg total) by mouth daily for cholesterol.   amLODipine  (NORVASC ) 10  MG tablet 90 tablet 1    Sig: Take 1 tablet (10 mg total) by mouth daily for hypertension    No follow-ups on file.  Concetta Dee, MD, FACP

## 2024-06-04 NOTE — Patient Instructions (Addendum)
 Please call [facility] ph # [phone] address [address]   VISIT SUMMARY:  Today, you had a follow-up visit to manage your end-stage renal disease and other health conditions. We reviewed your current treatments and made some adjustments to ensure your health remains stable.  YOUR PLAN:  -END STAGE RENAL DISEASE (ESRD): End-stage renal disease is the final stage of chronic kidney disease where the kidneys no longer function properly. You will continue with dialysis three times a week to manage this condition.  -HYPERTENSION: Hypertension is high blood pressure. Your blood pressure is well-controlled with your current medications, so you should continue taking labetalol  300 mg twice a day, amlodipine  10 mg daily, and isosorbide  60 mg daily.  -CONGESTIVE HEART FAILURE: Congestive heart failure is a condition where the heart does not pump blood as well as it should. Your symptoms are managed with labetalol  and isosorbide , so you should continue your current medication regimen.  -DIABETES MELLITUS: Diabetes mellitus is a condition that affects how your body processes blood sugar. Your A1c has improved slightly, but we need a fructosamine level for more accurate monitoring due to your ESRD and anemia. Continue taking Humalog  70/30 insulin , 3 units twice a day, avoid sugary drinks and snacks, and bring your continuous glucose monitor to appointments.  -HYPERLIPIDEMIA: Hyperlipidemia is having high levels of fats in the blood. Your cholesterol levels are managed with atorvastatin  and Zetia , so you should continue taking atorvastatin  40 mg daily and Zetia  10 mg daily.  -HYPOTHYROIDISM: Hypothyroidism is a condition where the thyroid  gland does not produce enough thyroid  hormone. Your last thyroid  level was normal, but it is time for another check. We will order a thyroid  level check.  -ANEMIA: Anemia is a condition where you do not have enough healthy red blood cells. This may  affect the accuracy of your A1c. We will order a blood count to assess your anemia.  -IRRITABLE BOWEL SYNDROME WITH CONSTIPATION: Irritable bowel syndrome with constipation is a gastrointestinal disorder causing abdominal pain and altered bowel habits. Your symptoms are managed with Miralax  and dietary fiber, so you should continue with these treatments.  -GENERAL HEALTH MAINTENANCE: For your general health, you need a shingles vaccination, an ophthalmology appointment, and a colonoscopy. We will provide a prescription for the shingles vaccine, contact a care coordinator to assist with your home health aide, provide contact information for [facility] for your eye appointment, and sign a release to obtain your previous colonoscopy report from [facility] before scheduling a new one.  INSTRUCTIONS:  Please visit the lab for blood count, fructosamine, and thyroid  level checks. Follow up with [facility] to schedule your ophthalmology appointment. Continue with your current treatments and follow the advice provided for each condition. Schedule a colonoscopy with a gastroenterologist after we obtain your previous report.

## 2024-06-05 ENCOUNTER — Other Ambulatory Visit (HOSPITAL_COMMUNITY): Payer: Self-pay

## 2024-06-05 ENCOUNTER — Ambulatory Visit: Payer: Self-pay | Admitting: Internal Medicine

## 2024-06-05 LAB — CBC
Hematocrit: 32.3 % — ABNORMAL LOW (ref 34.0–46.6)
Hemoglobin: 10.1 g/dL — ABNORMAL LOW (ref 11.1–15.9)
MCH: 28.6 pg (ref 26.6–33.0)
MCHC: 31.3 g/dL — ABNORMAL LOW (ref 31.5–35.7)
MCV: 92 fL (ref 79–97)
Platelets: 185 10*3/uL (ref 150–450)
RBC: 3.53 x10E6/uL — ABNORMAL LOW (ref 3.77–5.28)
RDW: 12.9 % (ref 11.7–15.4)
WBC: 10.2 10*3/uL (ref 3.4–10.8)

## 2024-06-05 LAB — TSH: TSH: 3.51 u[IU]/mL (ref 0.450–4.500)

## 2024-06-05 LAB — FRUCTOSAMINE: Fructosamine: 410 umol/L — ABNORMAL HIGH (ref 0–285)

## 2024-06-05 NOTE — Addendum Note (Signed)
 Addended by: Concetta Dee B on: 06/05/2024 03:03 PM   Modules accepted: Orders

## 2024-06-08 ENCOUNTER — Telehealth: Payer: Self-pay

## 2024-06-08 NOTE — Telephone Encounter (Signed)
 Message received from Dr Vicci stating patient still does not have PCS.  I called the patient to inquire if she called [facility]: [phone] and had to leave a message for her requesting a call back.   She had been approved for PCS last fall and needed to contact [facility] to select an agency to provide the services but that did not happen. I had provided her with the phone number for [facility] 12/16/2023 and instructed her to call.  She needs to call [facility] and if they say a new referral is needed we can do another one based on her visit with Dr Vicci 06/04/2024.

## 2024-06-09 ENCOUNTER — Other Ambulatory Visit (HOSPITAL_COMMUNITY): Payer: Self-pay | Admitting: Emergency Medicine

## 2024-06-09 NOTE — Telephone Encounter (Signed)
 I am with Nancy [name] doing a home visit today.  I relayed your message regarding her A1C results and goal.  She understands to increase insulin  to 5 units twice a day.     Mary Sharps, EMT-Paramedic [phone] 06/09/2024

## 2024-06-09 NOTE — Telephone Encounter (Signed)
 I assisted Mrs. Hufstetler with calling [facility] [phone].  The does have to reapply.  They are going to be mailing her out an applications which I have Makia know to be looking for it in the mail.  Thanks, Patrcia Schnepp

## 2024-06-09 NOTE — Progress Notes (Unsigned)
 Paramedicine Encounter    Patient ID: Nancy [name], female    DOB: 08/21/1961, 63 y.o.   MRN: [id number]   Complaints***  Assessment***  Compliance with meds***  Pill box filled***  Refills needed***  Meds changes since last visit***    Social changes***   There were no vitals taken for this visit. Weight yesterday-*** Last visit weight-***  ACTION: {Paramed Action:[phone]}  Mary Sharps, EMT-Paramedic [phone] 06/09/24  Patient Care Team: [name], MD as PCP - General (Internal Medicine) Court Dorn PARAS, MD as PCP - Cardiology (Cardiology) Rolan Ezra RAMAN, MD as PCP - Advanced Heart Failure (Cardiology) Center, [facility]  Patient Active Problem List   Diagnosis Date Noted   Irritable bowel syndrome with constipation 06/04/2024   S/P bilateral BKA (below knee amputation) (HCC) 02/04/2024   History of CVA (cerebrovascular accident) 03/22/2023   Community acquired pneumonia 03/22/2023   Pain, unspecified 08/15/2022   Complication of vascular dialysis catheter 08/07/2022   Chest pain 05/30/2022   Fluid overload 05/30/2022   Iron  deficiency anemia, unspecified 04/26/2022   Nausea 04/10/2022   Hyponatremia 04/04/2022   Prolonged QT interval 04/04/2022   Anaphylactic shock, unspecified, initial encounter 02/23/2022   Other allergy, initial encounter 02/23/2022   Pressure injury of skin 02/19/2022   Anemia in chronic kidney disease 02/16/2022   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) 02/16/2022   Hyperlipidemia, unspecified 02/16/2022   Hypertensive heart and chronic kidney disease with heart failure and with stage 5 chronic kidney disease, or end stage renal disease (HCC) 02/16/2022   Personal history of nicotine dependence 02/16/2022   Personal history of transient ischemic attack (TIA), and cerebral infarction without residual deficits 02/16/2022   Secondary hyperparathyroidism of renal origin (HCC) 02/16/2022   Renal anasarca  02/14/2022   ESRD on dialysis (HCC) 02/14/2022   Hyperkalemia 02/14/2022   S/P arteriovenous (AV) fistula creation 12/22/2021   Peripheral artery disease (HCC) 10/27/2021   Acute exacerbation of congestive heart failure (HCC) 10/24/2021   Acute cardiogenic pulmonary edema (HCC) 10/06/2021   CAD (coronary artery disease) 10/06/2021   Goals of care, counseling/discussion    Gangrene of left foot (HCC)    Cellulitis of left foot    CHF (congestive heart failure) (HCC) 07/07/2021   Acute postoperative anemia due to expected blood loss superimposed on anemia of chronic renal insufficiency 06/30/2021   CKD (chronic kidney disease), stage IV (HCC) 06/11/2021   Chronic diastolic CHF (congestive heart failure) (HCC) 06/10/2021   Abnormal nuclear stress test    Dyspnea on exertion 03/07/2021   Orthopnea 02/25/2021   History of cerebrovascular accident (CVA) with residual deficit 05/06/2020   AKI (acute kidney injury) (HCC) 04/20/2020   Generalized weakness 04/20/2020   Dehydration 04/20/2020   Generalized abdominal pain    Pancreatitis, acute 02/29/2020   Cerebrovascular accident (CVA) (HCC) 11/08/2019   Stenosis of right carotid artery 11/08/2019   Cortical age-related cataract of both eyes 08/30/2019   Gait abnormality 08/30/2019   Statin declined 08/30/2019   Gastroesophageal reflux disease without esophagitis 04/18/2018   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   Insulin  dependent type 2 diabetes mellitus (HCC) 10/29/2017   History of macular degeneration 10/29/2017   Chronic cervical pain 10/29/2016   Myalgia 09/14/2016   Insomnia 09/14/2016   Tinea pedis of both feet 08/20/2016   Macular degeneration 08/17/2016   Low back pain 09/21/2014   Hypertensive urgency 09/21/2014   HTN (hypertension) 09/08/2014   Thyroid  nodule 08/17/2014   Morbid obesity (HCC) 08/17/2014   Hypokalemia  08/06/2014   Type II diabetes mellitus with neurological manifestations, uncontrolled 08/04/2014    Current  Outpatient Medications:    acetaminophen  (TYLENOL ) 500 MG tablet, Take 1,000 mg by mouth every 8 (eight) hours as needed for mild pain (pain score 1-3), moderate pain (pain score 4-6) or headache., Disp: , Rfl:    albuterol  (VENTOLIN  HFA) 108 (90 Base) MCG/ACT inhaler, Inhale 2 puffs into the lungs every 6 (six) hours as needed., Disp: 18 g, Rfl: 3   ALPRAZolam  (XANAX ) 0.25 MG tablet, Take 1 tablet (0.25 mg total) by mouth on Monday, Wednesday and Friday at Hemodialysis. (Patient taking differently: Take 0.25 mg by mouth as needed (Mon/Wed/Fri @ dialysis due to anxiety).), Disp: 12 tablet, Rfl: 2   amLODipine  (NORVASC ) 10 MG tablet, Take 1 tablet (10 mg total) by mouth daily for hypertension (Patient taking differently: Take 10 mg by mouth daily. Takes in PM on Monday/Wed/Friday), Disp: 90 tablet, Rfl: 1   aspirin  81 MG EC tablet, Take 1 tablet (81 mg total) by mouth daily., Disp: 60 tablet, Rfl: 1   atorvastatin  (LIPITOR) 40 MG tablet, Take 1 tablet (40 mg total) by mouth daily for cholesterol., Disp: 90 tablet, Rfl: 1   Continuous Glucose Receiver (FREESTYLE LIBRE 3 READER) DEVI, Use to check blood glucose continuously. Dx code: E11.59, Z79.4, Disp: 1 each, Rfl: 0   Continuous Glucose Sensor (FREESTYLE LIBRE 3 PLUS SENSOR) MISC, Use to continuously monitor blood glucose, changing the sensor every 15 days., Disp: 2 each, Rfl: 11   ezetimibe  (ZETIA ) 10 MG tablet, Take 1 tablet (10 mg total) by mouth daily for cholesterol., Disp: 90 tablet, Rfl: 1   insulin  glargine (LANTUS  SOLOSTAR) 100 UNIT/ML Solostar Pen, Inject 3 Units into the skin 2 (two) times daily. (Patient taking differently: Inject 5 Units into the skin 2 (two) times daily. Increased by Provider on 6/24), Disp: 15 mL, Rfl: PRN   Insulin  Pen Needle (PEN NEEDLES) 31G X 8 MM MISC, Use for injection with insulin  pen daily., Disp: 100 each, Rfl: 6   isosorbide  mononitrate (IMDUR ) 30 MG 24 hr tablet, Take 2 tablets (60 mg total) by mouth daily for  blood pressure. (Patient taking differently: Take 60 mg by mouth daily. Mon/wed/fri takes dose in the evening due to dialysis), Disp: 180 tablet, Rfl: 1   labetalol  (NORMODYNE ) 300 MG tablet, Take 1 tablet (300 mg total) by mouth 2 (two) times daily. (Patient taking differently: Take 300 mg by mouth 2 (two) times daily. Takes 1 dose in PM on Mon/Wed/Fri due to dialysis.), Disp: 180 tablet, Rfl: 1   polyethylene glycol powder (GLYCOLAX /MIRALAX ) 17 GM/SCOOP powder, Mix 1 capful with 8 ounces of fluid and take by mouth daily as needed., Disp: 3350 g, Rfl: 1   sevelamer  carbonate (RENVELA ) 800 MG tablet, Take 3 tablets (2,400 mg total) by mouth 3 (three) times daily with meals and 1 tablet with snacks., Disp: 1080 tablet, Rfl: 3   Accu-Chek Softclix Lancets lancets, Use to check blood sugar 3 times daily. (Patient not taking: Reported on 06/09/2024), Disp: 100 each, Rfl: 6   Blood Glucose Monitoring Suppl (ACCU-CHEK GUIDE) w/Device KIT, Use to check blood sugar 3 times daily. (Patient not taking: Reported on 06/09/2024), Disp: 1 kit, Rfl: 0   glucose blood (ACCU-CHEK GUIDE) test strip, Use to check blood sugar 3 times daily. (Patient not taking: Reported on 06/09/2024), Disp: 100 each, Rfl: 6   levothyroxine  (SYNTHROID ) 25 MCG tablet, Take 1 tablet (25 mcg total) by mouth daily for  low thyroid  hormone., Disp: 90 tablet, Rfl: 1   Multiple Vitamin (MULTI-VITAMIN DAILY) TABS, Take 1 tablet by mouth at bedtime for supplementation (Patient not taking: Reported on 06/04/2024), Disp: 14 tablet, Rfl: 0   omeprazole  (PRILOSEC) 20 MG capsule, Take 2 capsules (40 mg total) by mouth daily for acid reflux., Disp: 180 capsule, Rfl: 1   ondansetron  (ZOFRAN ) 4 MG tablet, Take 1 tablet (4 mg total) by mouth every 8 (eight) hours as needed for nausea. (Patient not taking: Reported on 06/04/2024), Disp: 90 tablet, Rfl: 3   Zinc  220 (50 Zn) MG CAPS, Take 1 capsule by mouth daily for minerals, electrolytes (Patient not taking:  Reported on 06/09/2024), Disp: 14 capsule, Rfl: 0 Allergies  Allergen Reactions   Hydralazine  Other (See Comments)   Hydralazine  Hcl Other (See Comments)    Hair loss   Hydrocodone Itching and Other (See Comments)    Upset stomach   Metformin  And Related Nausea And Vomiting and Other (See Comments)    Stomach pains, also   Other Nausea Only and Other (See Comments)    Lettuce- Does not digest this!!   Plaquenil [Hydroxychloroquine Sulfate] Hives   Shellfish Allergy Nausea And Vomiting   Shellfish-Derived Products Nausea Only and Other (See Comments)    Caused an upset stomach   Shrimp (Diagnostic) Nausea Only and Other (See Comments)    Upset stomach    Sulfa Antibiotics Hives   Sulfur  Hives     Social History   Socioeconomic History   Marital status: Legally Separated    Spouse name: Lamar   Number of children: 1   Years of education: some colle   Highest education level: Not on file  Occupational History   Occupation: Employed FT as Warden/ranger    Comment: [employer] , NA  Tobacco Use   Smoking status: Former    Current packs/day: 0.25    Average packs/day: 0.3 packs/day for 0.5 years (0.1 ttl pk-yrs)    Types: Cigarettes    Passive exposure: Never   Smokeless tobacco: Never   Tobacco comments:    Former smoker 11/03/21  Vaping Use   Vaping status: Never Used  Substance and Sexual Activity   Alcohol use: No   Drug use: No   Sexual activity: Not Currently    Birth control/protection: None  Other Topics Concern   Not on file  Social History Narrative   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    Social Drivers of Health   Financial Resource Strain: Low Risk  (02/04/2024)   Overall Financial Resource Strain (CARDIA)    Difficulty of Paying Living Expenses: Not hard at all  Food Insecurity: No Food Insecurity (02/04/2024)   Hunger Vital Sign    Worried About Running Out of Food in the Last Year: Never true    Ran Out of Food  in the Last Year: Never true  Transportation Needs: No Transportation Needs (02/04/2024)   PRAPARE - Administrator, Civil Service (Medical): No    Lack of Transportation (Non-Medical): No  Physical Activity: Inactive (02/04/2024)   Exercise Vital Sign    Days of Exercise per Week: 0 days    Minutes of Exercise per Session: 0 min  Stress: No Stress Concern Present (02/04/2024)   [employer] of Occupational Health - Occupational Stress Questionnaire    Feeling of Stress : Not at all  Social Connections: Moderately Integrated (02/04/2024)   Social Connection and Isolation Panel  Frequency of Communication with Friends and Family: Twice a week    Frequency of Social Gatherings with Friends and Family: Once a week    Attends Religious Services: Never    Database administrator or Organizations: Yes    Attends Banker Meetings: Never    Marital Status: Married  Catering manager Violence: Not At Risk (02/04/2024)   Humiliation, Afraid, Rape, and Kick questionnaire    Fear of Current or Ex-Partner: No    Emotionally Abused: No    Physically Abused: No    Sexually Abused: No    Physical Exam      Future Appointments  Date Time Provider Department Center  08/27/2024  1:20 PM Dartha Ernst, MD [facility] None  09/17/2024 11:00 AM [facility]  09/17/2024 11:40 AM Lanis Fonda BRAVO, MD [facility]V  10/06/2024 11:10 AM [name], MD [facility] None

## 2024-06-10 ENCOUNTER — Other Ambulatory Visit (HOSPITAL_COMMUNITY): Payer: Self-pay

## 2024-06-10 ENCOUNTER — Other Ambulatory Visit: Payer: Self-pay | Admitting: Internal Medicine

## 2024-06-10 DIAGNOSIS — F411 Generalized anxiety disorder: Secondary | ICD-10-CM

## 2024-06-10 MED ORDER — ALPRAZOLAM 0.25 MG PO TABS
0.2500 mg | ORAL_TABLET | ORAL | 2 refills | Status: DC | PRN
  Filled 2024-06-10 – 2024-06-17 (×3): qty 12, 28d supply, fill #0
  Filled 2024-07-14: qty 12, 28d supply, fill #1
  Filled 2024-08-11: qty 12, 28d supply, fill #2

## 2024-06-11 ENCOUNTER — Other Ambulatory Visit (HOSPITAL_COMMUNITY): Payer: Self-pay

## 2024-06-12 ENCOUNTER — Other Ambulatory Visit (HOSPITAL_COMMUNITY): Payer: Self-pay

## 2024-06-12 ENCOUNTER — Ambulatory Visit: Admitting: Physician Assistant

## 2024-06-15 ENCOUNTER — Other Ambulatory Visit (HOSPITAL_COMMUNITY): Payer: Self-pay

## 2024-06-16 ENCOUNTER — Other Ambulatory Visit (HOSPITAL_COMMUNITY): Payer: Self-pay | Admitting: Emergency Medicine

## 2024-06-16 ENCOUNTER — Telehealth: Payer: Self-pay

## 2024-06-16 ENCOUNTER — Other Ambulatory Visit: Payer: Self-pay

## 2024-06-16 ENCOUNTER — Other Ambulatory Visit (HOSPITAL_COMMUNITY): Payer: Self-pay

## 2024-06-16 MED ORDER — LANTUS SOLOSTAR 100 UNIT/ML ~~LOC~~ SOPN
5.0000 [IU] | PEN_INJECTOR | Freq: Two times a day (BID) | SUBCUTANEOUS | 2 refills | Status: AC
  Filled 2024-06-16: qty 3, 30d supply, fill #0
  Filled 2024-06-17: qty 15, 150d supply, fill #0

## 2024-06-16 NOTE — Telephone Encounter (Signed)
 I spoke to Mary Sharps, EMT /Community Paramedicine and she is requesting that an order for the [employer] be sent to [facility] and a Prior Shara is needed.   Herlene- it looks like this order was already sent..  Is there anything else we need to do?

## 2024-06-16 NOTE — Progress Notes (Signed)
 Paramedicine Encounter    Patient ID: Nancy [name], female    DOB: Oct 24, 1961, 63 y.o.   MRN: [id number]   Complaints Gas  Assessment A&O x 4, skin W&D w/ good color. Denies chest pain or SOB.  Lung sounds clear bilat.  Compliance with meds YES  Pill box filled x 1 week  Refills needed Xanax - ready tomorrow morning for p/u, Freestyle Libre sensor  Meds changes since last visit NONE    Social changes NONE   BP (!) 130/90 (BP Location: Right Arm, Patient Position: Sitting, Cuff Size: Normal)   Pulse 78   Resp 16   Wt 237 lb 3.2 oz (107.6 kg)   SpO2 93%   BMI 42.02 kg/m  Weight yesterday- not taken Last visit weight-  [facility] regarding status of Freestyle Libre sensors.  Pt to p/u Xanax  tomorrow morning on the way to dialysis.  ACTION: Home visit completed  [name] [phone] 06/16/24  Patient Care Team: [name], MD as PCP - General (Internal Medicine) Court Dorn PARAS, MD as PCP - Cardiology (Cardiology) Rolan Ezra RAMAN, MD as PCP - Advanced Heart Failure (Cardiology) Center, [facility]  Patient Active Problem List   Diagnosis Date Noted   Irritable bowel syndrome with constipation 06/04/2024   S/P bilateral BKA (below knee amputation) (HCC) 02/04/2024   History of CVA (cerebrovascular accident) 03/22/2023   Community acquired pneumonia 03/22/2023   Pain, unspecified 08/15/2022   Complication of vascular dialysis catheter 08/07/2022   Chest pain 05/30/2022   Fluid overload 05/30/2022   Iron  deficiency anemia, unspecified 04/26/2022   Nausea 04/10/2022   Hyponatremia 04/04/2022   Prolonged QT interval 04/04/2022   Anaphylactic shock, unspecified, initial encounter 02/23/2022   Other allergy, initial encounter 02/23/2022   Pressure injury of skin 02/19/2022   Anemia in chronic kidney disease 02/16/2022   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) 02/16/2022   Hyperlipidemia, unspecified 02/16/2022    Hypertensive heart and chronic kidney disease with heart failure and with stage 5 chronic kidney disease, or end stage renal disease (HCC) 02/16/2022   Personal history of nicotine dependence 02/16/2022   Personal history of transient ischemic attack (TIA), and cerebral infarction without residual deficits 02/16/2022   Secondary hyperparathyroidism of renal origin (HCC) 02/16/2022   Renal anasarca 02/14/2022   ESRD on dialysis (HCC) 02/14/2022   Hyperkalemia 02/14/2022   S/P arteriovenous (AV) fistula creation 12/22/2021   Peripheral artery disease (HCC) 10/27/2021   Acute exacerbation of congestive heart failure (HCC) 10/24/2021   Acute cardiogenic pulmonary edema (HCC) 10/06/2021   CAD (coronary artery disease) 10/06/2021   Goals of care, counseling/discussion    Gangrene of left foot (HCC)    Cellulitis of left foot    CHF (congestive heart failure) (HCC) 07/07/2021   Acute postoperative anemia due to expected blood loss superimposed on anemia of chronic renal insufficiency 06/30/2021   CKD (chronic kidney disease), stage IV (HCC) 06/11/2021   Chronic diastolic CHF (congestive heart failure) (HCC) 06/10/2021   Abnormal nuclear stress test    Dyspnea on exertion 03/07/2021   Orthopnea 02/25/2021   History of cerebrovascular accident (CVA) with residual deficit 05/06/2020   AKI (acute kidney injury) (HCC) 04/20/2020   Generalized weakness 04/20/2020   Dehydration 04/20/2020   Generalized abdominal pain    Pancreatitis, acute 02/29/2020   Cerebrovascular accident (CVA) (HCC) 11/08/2019   Stenosis of right carotid artery 11/08/2019   Cortical age-related cataract of both eyes 08/30/2019   Gait  abnormality 08/30/2019   Statin declined 08/30/2019   Gastroesophageal reflux disease without esophagitis 04/18/2018   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   Insulin  dependent type 2 diabetes mellitus (HCC) 10/29/2017   History of macular degeneration 10/29/2017   Chronic cervical pain 10/29/2016    Myalgia 09/14/2016   Insomnia 09/14/2016   Tinea pedis of both feet 08/20/2016   Macular degeneration 08/17/2016   Low back pain 09/21/2014   Hypertensive urgency 09/21/2014   HTN (hypertension) 09/08/2014   Thyroid  nodule 08/17/2014   Morbid obesity (HCC) 08/17/2014   Hypokalemia 08/06/2014   Type II diabetes mellitus with neurological manifestations, uncontrolled 08/04/2014    Current Outpatient Medications:    acetaminophen  (TYLENOL ) 500 MG tablet, Take 1,000 mg by mouth every 8 (eight) hours as needed for mild pain (pain score 1-3), moderate pain (pain score 4-6) or headache., Disp: , Rfl:    albuterol  (VENTOLIN  HFA) 108 (90 Base) MCG/ACT inhaler, Inhale 2 puffs into the lungs every 6 (six) hours as needed., Disp: 18 g, Rfl: 3   amLODipine  (NORVASC ) 10 MG tablet, Take 1 tablet (10 mg total) by mouth daily for hypertension (Patient taking differently: Take 10 mg by mouth daily. Takes in PM on Monday/Wed/Friday), Disp: 90 tablet, Rfl: 1   aspirin  81 MG EC tablet, Take 1 tablet (81 mg total) by mouth daily., Disp: 60 tablet, Rfl: 1   atorvastatin  (LIPITOR) 40 MG tablet, Take 1 tablet (40 mg total) by mouth daily for cholesterol., Disp: 90 tablet, Rfl: 1   ezetimibe  (ZETIA ) 10 MG tablet, Take 1 tablet (10 mg total) by mouth daily for cholesterol., Disp: 90 tablet, Rfl: 1   insulin  glargine (LANTUS  SOLOSTAR) 100 UNIT/ML Solostar Pen, Inject 3 Units into the skin 2 (two) times daily. (Patient taking differently: Inject 5 Units into the skin 2 (two) times daily. Increased by Provider on 6/24), Disp: 15 mL, Rfl: PRN   Insulin  Pen Needle (PEN NEEDLES) 31G X 8 MM MISC, Use for injection with insulin  pen daily., Disp: 100 each, Rfl: 6   isosorbide  mononitrate (IMDUR ) 30 MG 24 hr tablet, Take 2 tablets (60 mg total) by mouth daily for blood pressure. (Patient taking differently: Take 60 mg by mouth daily. Mon/wed/fri takes dose in the evening due to dialysis), Disp: 180 tablet, Rfl: 1   levothyroxine   (SYNTHROID ) 25 MCG tablet, Take 1 tablet (25 mcg total) by mouth daily for low thyroid  hormone., Disp: 90 tablet, Rfl: 1   omeprazole  (PRILOSEC) 20 MG capsule, Take 2 capsules (40 mg total) by mouth daily for acid reflux., Disp: 180 capsule, Rfl: 1   polyethylene glycol powder (GLYCOLAX /MIRALAX ) 17 GM/SCOOP powder, Mix 1 capful with 8 ounces of fluid and take by mouth daily as needed., Disp: 3350 g, Rfl: 1   sevelamer  carbonate (RENVELA ) 800 MG tablet, Take 3 tablets (2,400 mg total) by mouth 3 (three) times daily with meals and 1 tablet with snacks., Disp: 1080 tablet, Rfl: 3   Accu-Chek Softclix Lancets lancets, Use to check blood sugar 3 times daily. (Patient not taking: Reported on 06/16/2024), Disp: 100 each, Rfl: 6   ALPRAZolam  (XANAX ) 0.25 MG tablet, Take 1 tablet (0.25 mg total) by mouth on Monday, Wednesday and Friday at hemodialysis., Disp: 12 tablet, Rfl: 2   Blood Glucose Monitoring Suppl (ACCU-CHEK GUIDE) w/Device KIT, Use to check blood sugar 3 times daily. (Patient not taking: Reported on 06/16/2024), Disp: 1 kit, Rfl: 0   Continuous Glucose Receiver (FREESTYLE LIBRE 3 READER) DEVI, Use to check blood glucose continuously.  Dx code: E11.59, Z79.4, Disp: 1 each, Rfl: 0   Continuous Glucose Sensor (FREESTYLE LIBRE 3 PLUS SENSOR) MISC, Use to continuously monitor blood glucose, changing the sensor every 15 days., Disp: 2 each, Rfl: 11   glucose blood (ACCU-CHEK GUIDE) test strip, Use to check blood sugar 3 times daily. (Patient not taking: Reported on 06/09/2024), Disp: 100 each, Rfl: 6   labetalol  (NORMODYNE ) 300 MG tablet, Take 1 tablet (300 mg total) by mouth 2 (two) times daily. (Patient taking differently: Take 300 mg by mouth 2 (two) times daily. Takes 1 dose in PM on Mon/Wed/Fri due to dialysis.), Disp: 180 tablet, Rfl: 1   Multiple Vitamin (MULTI-VITAMIN DAILY) TABS, Take 1 tablet by mouth at bedtime for supplementation (Patient not taking: Reported on 06/16/2024), Disp: 14 tablet, Rfl: 0    ondansetron  (ZOFRAN ) 4 MG tablet, Take 1 tablet (4 mg total) by mouth every 8 (eight) hours as needed for nausea. (Patient not taking: Reported on 06/04/2024), Disp: 90 tablet, Rfl: 3   Zinc  220 (50 Zn) MG CAPS, Take 1 capsule by mouth daily for minerals, electrolytes (Patient not taking: Reported on 06/16/2024), Disp: 14 capsule, Rfl: 0 Allergies  Allergen Reactions   Hydralazine  Other (See Comments)   Hydralazine  Hcl Other (See Comments)    Hair loss   Hydrocodone Itching and Other (See Comments)    Upset stomach   Metformin  And Related Nausea And Vomiting and Other (See Comments)    Stomach pains, also   Other Nausea Only and Other (See Comments)    Lettuce- Does not digest this!!   Plaquenil [Hydroxychloroquine Sulfate] Hives   Shellfish Allergy Nausea And Vomiting   Shellfish-Derived Products Nausea Only and Other (See Comments)    Caused an upset stomach   Shrimp (Diagnostic) Nausea Only and Other (See Comments)    Upset stomach    Sulfa Antibiotics Hives   Sulfur  Hives     Social History   Socioeconomic History   Marital status: Legally Separated    Spouse name: Lamar   Number of children: 1   Years of education: some colle   Highest education level: Not on file  Occupational History   Occupation: Employed FT as Warden/ranger    Comment: [employer] , NA  Tobacco Use   Smoking status: Former    Current packs/day: 0.25    Average packs/day: 0.3 packs/day for 0.5 years (0.1 ttl pk-yrs)    Types: Cigarettes    Passive exposure: Never   Smokeless tobacco: Never   Tobacco comments:    Former smoker 11/03/21  Vaping Use   Vaping status: Never Used  Substance and Sexual Activity   Alcohol use: No   Drug use: No   Sexual activity: Not Currently    Birth control/protection: None  Other Topics Concern   Not on file  Social History Narrative   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    Social Drivers of Health   Financial Resource  Strain: Low Risk  (02/04/2024)   Overall Financial Resource Strain (CARDIA)    Difficulty of Paying Living Expenses: Not hard at all  Food Insecurity: No Food Insecurity (02/04/2024)   Hunger Vital Sign    Worried About Running Out of Food in the Last Year: Never true    Ran Out of Food in the Last Year: Never true  Transportation Needs: No Transportation Needs (02/04/2024)   PRAPARE - Administrator, Civil Service (Medical): No  Lack of Transportation (Non-Medical): No  Physical Activity: Inactive (02/04/2024)   Exercise Vital Sign    Days of Exercise per Week: 0 days    Minutes of Exercise per Session: 0 min  Stress: No Stress Concern Present (02/04/2024)   [employer] of Occupational Health - Occupational Stress Questionnaire    Feeling of Stress : Not at all  Social Connections: Moderately Integrated (02/04/2024)   Social Connection and Isolation Panel    Frequency of Communication with Friends and Family: Twice a week    Frequency of Social Gatherings with Friends and Family: Once a week    Attends Religious Services: Never    Database administrator or Organizations: Yes    Attends Banker Meetings: Never    Marital Status: Married  Catering manager Violence: Not At Risk (02/04/2024)   Humiliation, Afraid, Rape, and Kick questionnaire    Fear of Current or Ex-Partner: No    Emotionally Abused: No    Physically Abused: No    Sexually Abused: No    Physical Exam      Future Appointments  Date Time Provider Department Center  08/27/2024  1:20 PM Dartha Ernst, MD [facility] None  09/17/2024 11:00 AM [facility]  09/17/2024 11:40 AM Lanis Fonda BRAVO, MD [facility]  10/06/2024 11:10 AM [name], MD [facility] None

## 2024-06-17 ENCOUNTER — Other Ambulatory Visit (HOSPITAL_COMMUNITY): Payer: Self-pay

## 2024-06-17 NOTE — Telephone Encounter (Signed)
 Conversation continued in another telephone encounter today

## 2024-06-17 NOTE — Telephone Encounter (Signed)
 I called the patient to inform her about Luke's message and to let her know that I have completed the new PCS request and CAP referral for Dr Vicci to sign.  I had to leave a message requesting a call back.

## 2024-06-18 ENCOUNTER — Other Ambulatory Visit (HOSPITAL_COMMUNITY): Payer: Self-pay

## 2024-06-18 NOTE — Telephone Encounter (Signed)
 Requests for PCS and CAP services efaxed to [facility].   I called the patient and explained that those referrals were faxed.  I also explained to her what Herlene said about the order for the [employer]. She understands that she needs to call  Medicare herself to inquire about a preferred distributor for the [place].  [name], can you help her call when you are with her in the home?  Thanks so much.

## 2024-06-22 ENCOUNTER — Ambulatory Visit: Admitting: "Endocrinology

## 2024-06-23 ENCOUNTER — Other Ambulatory Visit (HOSPITAL_COMMUNITY): Payer: Self-pay | Admitting: Emergency Medicine

## 2024-06-23 NOTE — Progress Notes (Signed)
 Paramedicine Encounter    Patient ID: Nancy [name], female    DOB: 25-Oct-1961, 63 y.o.   MRN: [id number]   Complaints NONE  Assessment A&O x 4 skin W&D w/ good color.  Denies chest pain or SOB.  Lung sounds clear and equal bilat No edema noted.  Compliance with meds Yes  Pill box filled x 1 week  Refills needed   Meds changes since last visit none    Social changes none   There were no vitals taken for this visit. Weight yesterday- not taken  Last visit weight-237.2lb   Mr. Gunderman is doing great with her med compliance.  She still needs to work on her nutrition as she is still having fried foods, frozen meals and fast foods.  Reminded her to make better choices and watch her sodium intake. Med box reconciled x 1 week w/o incident.  At her request, I assisted her in filling out paperwork for and upcoming eye appointment and endocrinologist appointment.  Also discussed with her that she needs to reach out to Medicare to determine where her Freestyle Herlene can be filled.  [facility] [address] so I'm hoping she can start getting it there again.  She advises she will call and to let me know if she has difficulty.   ACTION: Home visit completed  [name] [phone] 06/23/24  Patient Care Team: [name], MD as PCP - General (Internal Medicine) Court Dorn PARAS, MD as PCP - Cardiology (Cardiology) Rolan Ezra RAMAN, MD as PCP - Advanced Heart Failure (Cardiology) Center, [facility]  Patient Active Problem List   Diagnosis Date Noted   Irritable bowel syndrome with constipation 06/04/2024   S/P bilateral BKA (below knee amputation) (HCC) 02/04/2024   History of CVA (cerebrovascular accident) 03/22/2023   Community acquired pneumonia 03/22/2023   Pain, unspecified 08/15/2022   Complication of vascular dialysis catheter 08/07/2022   Chest pain 05/30/2022   Fluid overload 05/30/2022   Iron  deficiency  anemia, unspecified 04/26/2022   Nausea 04/10/2022   Hyponatremia 04/04/2022   Prolonged QT interval 04/04/2022   Anaphylactic shock, unspecified, initial encounter 02/23/2022   Other allergy, initial encounter 02/23/2022   Pressure injury of skin 02/19/2022   Anemia in chronic kidney disease 02/16/2022   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) 02/16/2022   Hyperlipidemia, unspecified 02/16/2022   Hypertensive heart and chronic kidney disease with heart failure and with stage 5 chronic kidney disease, or end stage renal disease (HCC) 02/16/2022   Personal history of nicotine dependence 02/16/2022   Personal history of transient ischemic attack (TIA), and cerebral infarction without residual deficits 02/16/2022   Secondary hyperparathyroidism of renal origin (HCC) 02/16/2022   Renal anasarca 02/14/2022   ESRD on dialysis (HCC) 02/14/2022   Hyperkalemia 02/14/2022   S/P arteriovenous (AV) fistula creation 12/22/2021   Peripheral artery disease (HCC) 10/27/2021   Acute exacerbation of congestive heart failure (HCC) 10/24/2021   Acute cardiogenic pulmonary edema (HCC) 10/06/2021   CAD (coronary artery disease) 10/06/2021   Goals of care, counseling/discussion    Gangrene of left foot (HCC)    Cellulitis of left foot    CHF (congestive heart failure) (HCC) 07/07/2021   Acute postoperative anemia due to expected blood loss superimposed on anemia of chronic renal insufficiency 06/30/2021   CKD (chronic kidney disease), stage IV (HCC) 06/11/2021   Chronic diastolic CHF (congestive heart failure) (HCC) 06/10/2021   Abnormal nuclear stress test    Dyspnea on  exertion 03/07/2021   Orthopnea 02/25/2021   History of cerebrovascular accident (CVA) with residual deficit 05/06/2020   AKI (acute kidney injury) (HCC) 04/20/2020   Generalized weakness 04/20/2020   Dehydration 04/20/2020   Generalized abdominal pain    Pancreatitis, acute 02/29/2020   Cerebrovascular accident (CVA) (HCC) 11/08/2019    Stenosis of right carotid artery 11/08/2019   Cortical age-related cataract of both eyes 08/30/2019   Gait abnormality 08/30/2019   Statin declined 08/30/2019   Gastroesophageal reflux disease without esophagitis 04/18/2018   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   Insulin  dependent type 2 diabetes mellitus (HCC) 10/29/2017   History of macular degeneration 10/29/2017   Chronic cervical pain 10/29/2016   Myalgia 09/14/2016   Insomnia 09/14/2016   Tinea pedis of both feet 08/20/2016   Macular degeneration 08/17/2016   Low back pain 09/21/2014   Hypertensive urgency 09/21/2014   HTN (hypertension) 09/08/2014   Thyroid  nodule 08/17/2014   Morbid obesity (HCC) 08/17/2014   Hypokalemia 08/06/2014   Type II diabetes mellitus with neurological manifestations, uncontrolled 08/04/2014    Current Outpatient Medications:    acetaminophen  (TYLENOL ) 500 MG tablet, Take 1,000 mg by mouth every 8 (eight) hours as needed for mild pain (pain score 1-3), moderate pain (pain score 4-6) or headache., Disp: , Rfl:    albuterol  (VENTOLIN  HFA) 108 (90 Base) MCG/ACT inhaler, Inhale 2 puffs into the lungs every 6 (six) hours as needed., Disp: 18 g, Rfl: 3   ALPRAZolam  (XANAX ) 0.25 MG tablet, Take 1 tablet (0.25 mg total) by mouth on Monday, Wednesday and Friday at hemodialysis., Disp: 12 tablet, Rfl: 2   amLODipine  (NORVASC ) 10 MG tablet, Take 1 tablet (10 mg total) by mouth daily for hypertension, Disp: 90 tablet, Rfl: 1   aspirin  81 MG EC tablet, Take 1 tablet (81 mg total) by mouth daily., Disp: 60 tablet, Rfl: 1   atorvastatin  (LIPITOR) 40 MG tablet, Take 1 tablet (40 mg total) by mouth daily for cholesterol., Disp: 90 tablet, Rfl: 1   ezetimibe  (ZETIA ) 10 MG tablet, Take 1 tablet (10 mg total) by mouth daily for cholesterol., Disp: 90 tablet, Rfl: 1   insulin  glargine (LANTUS  SOLOSTAR) 100 UNIT/ML Solostar Pen, Inject 5 Units into the skin 2 (two) times daily. Increased by Provider on 6/24, Disp: 15 mL, Rfl: 2    Insulin  Pen Needle (PEN NEEDLES) 31G X 8 MM MISC, Use for injection with insulin  pen daily., Disp: 100 each, Rfl: 6   isosorbide  mononitrate (IMDUR ) 30 MG 24 hr tablet, Take 2 tablets (60 mg total) by mouth daily for blood pressure. (Patient taking differently: Take 60 mg by mouth daily. Mon/wed/fri takes dose in the evening due to dialysis), Disp: 180 tablet, Rfl: 1   labetalol  (NORMODYNE ) 300 MG tablet, Take 1 tablet (300 mg total) by mouth 2 (two) times daily. (Patient taking differently: Take 300 mg by mouth 2 (two) times daily. Takes 1 dose in PM on Mon/Wed/Fri due to dialysis.), Disp: 180 tablet, Rfl: 1   levothyroxine  (SYNTHROID ) 25 MCG tablet, Take 1 tablet (25 mcg total) by mouth daily for low thyroid  hormone., Disp: 90 tablet, Rfl: 1   omeprazole  (PRILOSEC) 20 MG capsule, Take 2 capsules (40 mg total) by mouth daily for acid reflux., Disp: 180 capsule, Rfl: 1   polyethylene glycol powder (GLYCOLAX /MIRALAX ) 17 GM/SCOOP powder, Mix 1 capful with 8 ounces of fluid and take by mouth daily as needed., Disp: 3350 g, Rfl: 1   Accu-Chek Softclix Lancets lancets, Use to check blood sugar  3 times daily. (Patient not taking: Reported on 06/16/2024), Disp: 100 each, Rfl: 6   Blood Glucose Monitoring Suppl (ACCU-CHEK GUIDE) w/Device KIT, Use to check blood sugar 3 times daily. (Patient not taking: Reported on 06/23/2024), Disp: 1 kit, Rfl: 0   Continuous Glucose Receiver (FREESTYLE LIBRE 3 READER) DEVI, Use to check blood glucose continuously. Dx code: E11.59, Z79.4, Disp: 1 each, Rfl: 0   Continuous Glucose Sensor (FREESTYLE LIBRE 3 PLUS SENSOR) MISC, Use to continuously monitor blood glucose, changing the sensor every 15 days., Disp: 2 each, Rfl: 11   glucose blood (ACCU-CHEK GUIDE) test strip, Use to check blood sugar 3 times daily. (Patient not taking: Reported on 06/23/2024), Disp: 100 each, Rfl: 6   Multiple Vitamin (MULTI-VITAMIN DAILY) TABS, Take 1 tablet by mouth at bedtime for supplementation (Patient  not taking: Reported on 06/23/2024), Disp: 14 tablet, Rfl: 0   ondansetron  (ZOFRAN ) 4 MG tablet, Take 1 tablet (4 mg total) by mouth every 8 (eight) hours as needed for nausea. (Patient not taking: Reported on 06/23/2024), Disp: 90 tablet, Rfl: 3   sevelamer  carbonate (RENVELA ) 800 MG tablet, Take 3 tablets (2,400 mg total) by mouth 3 (three) times daily with meals and 1 tablet with snacks., Disp: 1080 tablet, Rfl: 3   Zinc  220 (50 Zn) MG CAPS, Take 1 capsule by mouth daily for minerals, electrolytes (Patient not taking: Reported on 06/23/2024), Disp: 14 capsule, Rfl: 0 Allergies  Allergen Reactions   Hydralazine  Other (See Comments)   Hydralazine  Hcl Other (See Comments)    Hair loss   Hydrocodone Itching and Other (See Comments)    Upset stomach   Metformin  And Related Nausea And Vomiting and Other (See Comments)    Stomach pains, also   Other Nausea Only and Other (See Comments)    Lettuce- Does not digest this!!   Plaquenil [Hydroxychloroquine Sulfate] Hives   Shellfish Allergy Nausea And Vomiting   Shellfish-Derived Products Nausea Only and Other (See Comments)    Caused an upset stomach   Shrimp (Diagnostic) Nausea Only and Other (See Comments)    Upset stomach    Sulfa Antibiotics Hives   Sulfur  Hives     Social History   Socioeconomic History   Marital status: Legally Separated    Spouse name: Lamar   Number of children: 1   Years of education: some colle   Highest education level: Not on file  Occupational History   Occupation: Employed FT as Warden/ranger    Comment: [employer] , NA  Tobacco Use   Smoking status: Former    Current packs/day: 0.25    Average packs/day: 0.3 packs/day for 0.5 years (0.1 ttl pk-yrs)    Types: Cigarettes    Passive exposure: Never   Smokeless tobacco: Never   Tobacco comments:    Former smoker 11/03/21  Vaping Use   Vaping status: Never Used  Substance and Sexual Activity   Alcohol use: No   Drug use: No   Sexual activity: Not  Currently    Birth control/protection: None  Other Topics Concern   Not on file  Social History Narrative   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    Social Drivers of Health   Financial Resource Strain: Low Risk  (02/04/2024)   Overall Financial Resource Strain (CARDIA)    Difficulty of Paying Living Expenses: Not hard at all  Food Insecurity: No Food Insecurity (02/04/2024)   Hunger Vital Sign    Worried About Running  Out of Food in the Last Year: Never true    Ran Out of Food in the Last Year: Never true  Transportation Needs: No Transportation Needs (02/04/2024)   PRAPARE - Administrator, Civil Service (Medical): No    Lack of Transportation (Non-Medical): No  Physical Activity: Inactive (02/04/2024)   Exercise Vital Sign    Days of Exercise per Week: 0 days    Minutes of Exercise per Session: 0 min  Stress: No Stress Concern Present (02/04/2024)   [employer] of Occupational Health - Occupational Stress Questionnaire    Feeling of Stress : Not at all  Social Connections: Moderately Integrated (02/04/2024)   Social Connection and Isolation Panel    Frequency of Communication with Friends and Family: Twice a week    Frequency of Social Gatherings with Friends and Family: Once a week    Attends Religious Services: Never    Database administrator or Organizations: Yes    Attends Banker Meetings: Never    Marital Status: Married  Catering manager Violence: Not At Risk (02/04/2024)   Humiliation, Afraid, Rape, and Kick questionnaire    Fear of Current or Ex-Partner: No    Emotionally Abused: No    Physically Abused: No    Sexually Abused: No    Physical Exam      Future Appointments  Date Time Provider Department Center  08/27/2024  1:20 PM Dartha Ernst, MD [facility] None  09/17/2024 11:00 AM [facility]  09/17/2024 11:40 AM Lanis Fonda BRAVO, MD [facility]  10/06/2024 11:10 AM [name], MD [facility] None

## 2024-06-30 ENCOUNTER — Other Ambulatory Visit (HOSPITAL_COMMUNITY): Payer: Self-pay | Admitting: Emergency Medicine

## 2024-06-30 ENCOUNTER — Encounter (HOSPITAL_COMMUNITY): Payer: Self-pay | Admitting: Emergency Medicine

## 2024-06-30 NOTE — Progress Notes (Signed)
 Paramedicine Encounter    Patient ID: Nancy [name], female    DOB: Apr 20, 1961, 63 y.o.   MRN: [id number]   Complaints NONE  Assessment A&Ox 4, skin W&D w/ good color. Denies chest pain or SOB.  Lung sounds clear throughout. No edema noted.  Compliance with meds YES  Pill box filled x 1 week  Refills needed NONE  Meds changes since last visit NONE    Social changes NONE   BP (!) 110/0 (BP Location: Right Arm, Patient Position: Sitting, Cuff Size: Normal) Comment: palpated  Pulse 80   Resp 16   Wt 233 lb (105.7 kg)   SpO2 94%   BMI 41.27 kg/m  Weight yesterday-not taken Last visit weight-232lb  ACTION: Home visit completed  [name] [phone] 06/30/24  Patient Care Team: [name], MD as PCP - General (Internal Medicine) Court Dorn PARAS, MD as PCP - Cardiology (Cardiology) Rolan Ezra RAMAN, MD as PCP - Advanced Heart Failure (Cardiology) Center, [facility]  Patient Active Problem List   Diagnosis Date Noted   Irritable bowel syndrome with constipation 06/04/2024   S/P bilateral BKA (below knee amputation) (HCC) 02/04/2024   History of CVA (cerebrovascular accident) 03/22/2023   Community acquired pneumonia 03/22/2023   Pain, unspecified 08/15/2022   Complication of vascular dialysis catheter 08/07/2022   Chest pain 05/30/2022   Fluid overload 05/30/2022   Iron  deficiency anemia, unspecified 04/26/2022   Nausea 04/10/2022   Hyponatremia 04/04/2022   Prolonged QT interval 04/04/2022   Anaphylactic shock, unspecified, initial encounter 02/23/2022   Other allergy, initial encounter 02/23/2022   Pressure injury of skin 02/19/2022   Anemia in chronic kidney disease 02/16/2022   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) 02/16/2022   Hyperlipidemia, unspecified 02/16/2022   Hypertensive heart and chronic kidney disease with heart failure and with stage 5 chronic kidney disease, or end stage renal disease (HCC) 02/16/2022    Personal history of nicotine dependence 02/16/2022   Personal history of transient ischemic attack (TIA), and cerebral infarction without residual deficits 02/16/2022   Secondary hyperparathyroidism of renal origin (HCC) 02/16/2022   Renal anasarca 02/14/2022   ESRD on dialysis (HCC) 02/14/2022   Hyperkalemia 02/14/2022   S/P arteriovenous (AV) fistula creation 12/22/2021   Peripheral artery disease (HCC) 10/27/2021   Acute exacerbation of congestive heart failure (HCC) 10/24/2021   Acute cardiogenic pulmonary edema (HCC) 10/06/2021   CAD (coronary artery disease) 10/06/2021   Goals of care, counseling/discussion    Gangrene of left foot (HCC)    Cellulitis of left foot    CHF (congestive heart failure) (HCC) 07/07/2021   Acute postoperative anemia due to expected blood loss superimposed on anemia of chronic renal insufficiency 06/30/2021   CKD (chronic kidney disease), stage IV (HCC) 06/11/2021   Chronic diastolic CHF (congestive heart failure) (HCC) 06/10/2021   Abnormal nuclear stress test    Dyspnea on exertion 03/07/2021   Orthopnea 02/25/2021   History of cerebrovascular accident (CVA) with residual deficit 05/06/2020   AKI (acute kidney injury) (HCC) 04/20/2020   Generalized weakness 04/20/2020   Dehydration 04/20/2020   Generalized abdominal pain    Pancreatitis, acute 02/29/2020   Cerebrovascular accident (CVA) (HCC) 11/08/2019   Stenosis of right carotid artery 11/08/2019   Cortical age-related cataract of both eyes 08/30/2019   Gait abnormality 08/30/2019   Statin declined 08/30/2019   Gastroesophageal reflux disease without esophagitis 04/18/2018   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   Insulin  dependent type 2 diabetes mellitus (HCC) 10/29/2017   History  of macular degeneration 10/29/2017   Chronic cervical pain 10/29/2016   Myalgia 09/14/2016   Insomnia 09/14/2016   Tinea pedis of both feet 08/20/2016   Macular degeneration 08/17/2016   Low back pain 09/21/2014    Hypertensive urgency 09/21/2014   HTN (hypertension) 09/08/2014   Thyroid  nodule 08/17/2014   Morbid obesity (HCC) 08/17/2014   Hypokalemia 08/06/2014   Type II diabetes mellitus with neurological manifestations, uncontrolled 08/04/2014    Current Outpatient Medications:    acetaminophen  (TYLENOL ) 500 MG tablet, Take 1,000 mg by mouth every 8 (eight) hours as needed for mild pain (pain score 1-3), moderate pain (pain score 4-6) or headache., Disp: , Rfl:    albuterol  (VENTOLIN  HFA) 108 (90 Base) MCG/ACT inhaler, Inhale 2 puffs into the lungs every 6 (six) hours as needed., Disp: 18 g, Rfl: 3   ALPRAZolam  (XANAX ) 0.25 MG tablet, Take 1 tablet (0.25 mg total) by mouth on Monday, Wednesday and Friday at hemodialysis., Disp: 12 tablet, Rfl: 2   amLODipine  (NORVASC ) 10 MG tablet, Take 1 tablet (10 mg total) by mouth daily for hypertension, Disp: 90 tablet, Rfl: 1   aspirin  81 MG EC tablet, Take 1 tablet (81 mg total) by mouth daily., Disp: 60 tablet, Rfl: 1   atorvastatin  (LIPITOR) 40 MG tablet, Take 1 tablet (40 mg total) by mouth daily for cholesterol., Disp: 90 tablet, Rfl: 1   ezetimibe  (ZETIA ) 10 MG tablet, Take 1 tablet (10 mg total) by mouth daily for cholesterol., Disp: 90 tablet, Rfl: 1   insulin  glargine (LANTUS  SOLOSTAR) 100 UNIT/ML Solostar Pen, Inject 5 Units into the skin 2 (two) times daily. Increased by Provider on 6/24, Disp: 15 mL, Rfl: 2   Insulin  Pen Needle (PEN NEEDLES) 31G X 8 MM MISC, Use for injection with insulin  pen daily., Disp: 100 each, Rfl: 6   isosorbide  mononitrate (IMDUR ) 30 MG 24 hr tablet, Take 2 tablets (60 mg total) by mouth daily for blood pressure. (Patient taking differently: Take 60 mg by mouth daily. Mon/wed/fri takes dose in the evening due to dialysis), Disp: 180 tablet, Rfl: 1   labetalol  (NORMODYNE ) 300 MG tablet, Take 1 tablet (300 mg total) by mouth 2 (two) times daily. (Patient taking differently: Take 300 mg by mouth 2 (two) times daily. Takes 1 dose in  PM on Mon/Wed/Fri due to dialysis.), Disp: 180 tablet, Rfl: 1   levothyroxine  (SYNTHROID ) 25 MCG tablet, Take 1 tablet (25 mcg total) by mouth daily for low thyroid  hormone., Disp: 90 tablet, Rfl: 1   omeprazole  (PRILOSEC) 20 MG capsule, Take 2 capsules (40 mg total) by mouth daily for acid reflux., Disp: 180 capsule, Rfl: 1   polyethylene glycol powder (GLYCOLAX /MIRALAX ) 17 GM/SCOOP powder, Mix 1 capful with 8 ounces of fluid and take by mouth daily as needed., Disp: 3350 g, Rfl: 1   sevelamer  carbonate (RENVELA ) 800 MG tablet, Take 3 tablets (2,400 mg total) by mouth 3 (three) times daily with meals and 1 tablet with snacks., Disp: 1080 tablet, Rfl: 3   Accu-Chek Softclix Lancets lancets, Use to check blood sugar 3 times daily. (Patient not taking: Reported on 06/30/2024), Disp: 100 each, Rfl: 6   Blood Glucose Monitoring Suppl (ACCU-CHEK GUIDE) w/Device KIT, Use to check blood sugar 3 times daily. (Patient not taking: Reported on 06/30/2024), Disp: 1 kit, Rfl: 0   Continuous Glucose Receiver (FREESTYLE LIBRE 3 READER) DEVI, Use to check blood glucose continuously. Dx code: E11.59, Z79.4, Disp: 1 each, Rfl: 0   Continuous Glucose Sensor (  FREESTYLE LIBRE 3 PLUS SENSOR) MISC, Use to continuously monitor blood glucose, changing the sensor every 15 days., Disp: 2 each, Rfl: 11   glucose blood (ACCU-CHEK GUIDE) test strip, Use to check blood sugar 3 times daily. (Patient not taking: Reported on 06/30/2024), Disp: 100 each, Rfl: 6   Multiple Vitamin (MULTI-VITAMIN DAILY) TABS, Take 1 tablet by mouth at bedtime for supplementation (Patient not taking: Reported on 06/30/2024), Disp: 14 tablet, Rfl: 0   ondansetron  (ZOFRAN ) 4 MG tablet, Take 1 tablet (4 mg total) by mouth every 8 (eight) hours as needed for nausea. (Patient not taking: Reported on 06/23/2024), Disp: 90 tablet, Rfl: 3   Zinc  220 (50 Zn) MG CAPS, Take 1 capsule by mouth daily for minerals, electrolytes (Patient not taking: Reported on 06/30/2024), Disp:  14 capsule, Rfl: 0 Allergies  Allergen Reactions   Hydralazine  Other (See Comments)   Hydralazine  Hcl Other (See Comments)    Hair loss   Hydrocodone Itching and Other (See Comments)    Upset stomach   Metformin  And Related Nausea And Vomiting and Other (See Comments)    Stomach pains, also   Other Nausea Only and Other (See Comments)    Lettuce- Does not digest this!!   Plaquenil [Hydroxychloroquine Sulfate] Hives   Shellfish Allergy Nausea And Vomiting   Shellfish-Derived Products Nausea Only and Other (See Comments)    Caused an upset stomach   Shrimp (Diagnostic) Nausea Only and Other (See Comments)    Upset stomach    Sulfa Antibiotics Hives   Sulfur  Hives     Social History   Socioeconomic History   Marital status: Legally Separated    Spouse name: Lamar   Number of children: 1   Years of education: some colle   Highest education level: Not on file  Occupational History   Occupation: Employed FT as Warden/ranger    Comment: [employer] , NA  Tobacco Use   Smoking status: Former    Current packs/day: 0.25    Average packs/day: 0.3 packs/day for 0.5 years (0.1 ttl pk-yrs)    Types: Cigarettes    Passive exposure: Never   Smokeless tobacco: Never   Tobacco comments:    Former smoker 11/03/21  Vaping Use   Vaping status: Never Used  Substance and Sexual Activity   Alcohol use: No   Drug use: No   Sexual activity: Not Currently    Birth control/protection: None  Other Topics Concern   Not on file  Social History Narrative   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    Social Drivers of Health   Financial Resource Strain: Low Risk  (02/04/2024)   Overall Financial Resource Strain (CARDIA)    Difficulty of Paying Living Expenses: Not hard at all  Food Insecurity: No Food Insecurity (02/04/2024)   Hunger Vital Sign    Worried About Running Out of Food in the Last Year: Never true    Ran Out of Food in the Last Year: Never true   Transportation Needs: No Transportation Needs (02/04/2024)   PRAPARE - Administrator, Civil Service (Medical): No    Lack of Transportation (Non-Medical): No  Physical Activity: Inactive (02/04/2024)   Exercise Vital Sign    Days of Exercise per Week: 0 days    Minutes of Exercise per Session: 0 min  Stress: No Stress Concern Present (02/04/2024)   [employer] of Occupational Health - Occupational Stress Questionnaire    Feeling of Stress :  Not at all  Social Connections: Moderately Integrated (02/04/2024)   Social Connection and Isolation Panel    Frequency of Communication with Friends and Family: Twice a week    Frequency of Social Gatherings with Friends and Family: Once a week    Attends Religious Services: Never    Database administrator or Organizations: Yes    Attends Banker Meetings: Never    Marital Status: Married  Catering manager Violence: Not At Risk (02/04/2024)   Humiliation, Afraid, Rape, and Kick questionnaire    Fear of Current or Ex-Partner: No    Emotionally Abused: No    Physically Abused: No    Sexually Abused: No    Physical Exam      Future Appointments  Date Time Provider Department Center  08/27/2024  1:20 PM Dartha Ernst, MD [facility] None  09/17/2024 11:00 AM [facility]  09/17/2024 11:40 AM Lanis Fonda BRAVO, MD [facility]  10/06/2024 11:10 AM [name], MD [facility] None

## 2024-07-07 ENCOUNTER — Other Ambulatory Visit: Payer: Self-pay | Admitting: Pharmacist

## 2024-07-07 ENCOUNTER — Other Ambulatory Visit (HOSPITAL_COMMUNITY): Payer: Self-pay | Admitting: Emergency Medicine

## 2024-07-07 MED ORDER — FREESTYLE LIBRE 3 PLUS SENSOR MISC: 11 refills | Status: DC

## 2024-07-07 NOTE — Progress Notes (Unsigned)
 Paramedicine Encounter    Patient ID: Nancy [name], female    DOB: 05-11-1961, 63 y.o.   MRN: [id number]   Complaints NONE  Assessment A&O x 4, skin W&D w/ good color.  Denies chest pain or SOB.  Lung sounds clear and equal bilat.  Compliance with meds YES  Pill box filled x 1 week  Refills needed NONE  Meds changes since last visit NONE    Social changes NONE   BP (!) 140/70 (BP Location: Right Arm, Patient Position: Sitting, Cuff Size: Normal)   Pulse 85   Resp 18   Wt 238 lb 6.4 oz (108.1 kg)   SpO2 95%   BMI 42.23 kg/m  Weight yesterday- not taken Last visit weight-233lb    ACTION: {Paramed Action:[id number]}  Mary Sharps, EMT-Paramedic [phone] 07/07/24  Patient Care Team: [name], MD as PCP - General (Internal Medicine) Court Dorn PARAS, MD as PCP - Cardiology (Cardiology) Rolan Ezra RAMAN, MD as PCP - Advanced Heart Failure (Cardiology) Center, [facility]  Patient Active Problem List   Diagnosis Date Noted  . Irritable bowel syndrome with constipation 06/04/2024  . S/P bilateral BKA (below knee amputation) (HCC) 02/04/2024  . History of CVA (cerebrovascular accident) 03/22/2023  . Community acquired pneumonia 03/22/2023  . Pain, unspecified 08/15/2022  . Complication of vascular dialysis catheter 08/07/2022  . Chest pain 05/30/2022  . Fluid overload 05/30/2022  . Iron  deficiency anemia, unspecified 04/26/2022  . Nausea 04/10/2022  . Hyponatremia 04/04/2022  . Prolonged QT interval 04/04/2022  . Anaphylactic shock, unspecified, initial encounter 02/23/2022  . Other allergy, initial encounter 02/23/2022  . Pressure injury of skin 02/19/2022  . Anemia in chronic kidney disease 02/16/2022  . 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 Hyperlipidemia, unspecified 02/16/2022  . Hypertensive heart and chronic kidney disease with heart failure and with stage 5 chronic kidney disease, or end stage renal disease (HCC)  02/16/2022  . Personal history of nicotine dependence 02/16/2022  . Personal history of transient ischemic attack (TIA), and cerebral infarction without residual deficits 02/16/2022  . Secondary hyperparathyroidism of renal origin (HCC) 02/16/2022  . Renal anasarca 02/14/2022  . ESRD on dialysis (HCC) 02/14/2022  . Hyperkalemia 02/14/2022  . S/P arteriovenous (AV) fistula creation 12/22/2021  . Peripheral artery disease (HCC) 10/27/2021  . Acute exacerbation of congestive heart failure (HCC) 10/24/2021  . Acute cardiogenic pulmonary edema (HCC) 10/06/2021  . CAD (coronary artery disease) 10/06/2021  . Goals of care, counseling/discussion   . Gangrene of left foot (HCC)   . Cellulitis of left foot   . CHF (congestive heart failure) (HCC) 07/07/2021  . Acute postoperative anemia due to expected blood loss superimposed on anemia of chronic renal insufficiency 06/30/2021  . CKD (chronic kidney disease), stage IV (HCC) 06/11/2021  . Chronic diastolic CHF (congestive heart failure) (HCC) 06/10/2021  . Abnormal nuclear stress test   . Dyspnea on exertion 03/07/2021  . Orthopnea 02/25/2021  . History of cerebrovascular accident (CVA) with residual deficit 05/06/2020  . AKI (acute kidney injury) (HCC) 04/20/2020  . Generalized weakness 04/20/2020  . Dehydration 04/20/2020  . Generalized abdominal pain   . Pancreatitis, acute 02/29/2020  . Cerebrovascular accident (CVA) (HCC) 11/08/2019  . Stenosis of right carotid artery 11/08/2019  . Cortical age-related cataract of both eyes 08/30/2019  . Gait abnormality 08/30/2019  . Statin declined 08/30/2019  . Gastroesophageal reflux disease without esophagitis 04/18/2018  . 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 Insulin  dependent type 2 diabetes mellitus (HCC)  10/29/2017  . History of macular degeneration 10/29/2017  . Chronic cervical pain 10/29/2016  . Myalgia 09/14/2016  . Insomnia 09/14/2016  . Tinea pedis of both feet 08/20/2016  . Macular  degeneration 08/17/2016  . Low back pain 09/21/2014  . Hypertensive urgency 09/21/2014  . HTN (hypertension) 09/08/2014  . Thyroid  nodule 08/17/2014  . Morbid obesity (HCC) 08/17/2014  . Hypokalemia 08/06/2014  . Type II diabetes mellitus with neurological manifestations, uncontrolled 08/04/2014    Current Outpatient Medications:  .  acetaminophen  (TYLENOL ) 500 MG tablet, Take 1,000 mg by mouth every 8 (eight) hours as needed for mild pain (pain score 1-3), moderate pain (pain score 4-6) or headache., Disp: , Rfl:  .  albuterol  (VENTOLIN  HFA) 108 (90 Base) MCG/ACT inhaler, Inhale 2 puffs into the lungs every 6 (six) hours as needed., Disp: 18 g, Rfl: 3 .  ALPRAZolam  (XANAX ) 0.25 MG tablet, Take 1 tablet (0.25 mg total) by mouth on Monday, Wednesday and Friday at hemodialysis., Disp: 12 tablet, Rfl: 2 .  amLODipine  (NORVASC ) 10 MG tablet, Take 1 tablet (10 mg total) by mouth daily for hypertension, Disp: 90 tablet, Rfl: 1 .  aspirin  81 MG EC tablet, Take 1 tablet (81 mg total) by mouth daily., Disp: 60 tablet, Rfl: 1 .  atorvastatin  (LIPITOR) 40 MG tablet, Take 1 tablet (40 mg total) by mouth daily for cholesterol., Disp: 90 tablet, Rfl: 1 .  ezetimibe  (ZETIA ) 10 MG tablet, Take 1 tablet (10 mg total) by mouth daily for cholesterol., Disp: 90 tablet, Rfl: 1 .  Insulin  Pen Needle (PEN NEEDLES) 31G X 8 MM MISC, Use for injection with insulin  pen daily., Disp: 100 each, Rfl: 6 .  isosorbide  mononitrate (IMDUR ) 30 MG 24 hr tablet, Take 2 tablets (60 mg total) by mouth daily for blood pressure. (Patient taking differently: Take 60 mg by mouth daily. Mon/wed/fri takes dose in the evening due to dialysis), Disp: 180 tablet, Rfl: 1 .  labetalol  (NORMODYNE ) 300 MG tablet, Take 1 tablet (300 mg total) by mouth 2 (two) times daily. (Patient taking differently: Take 300 mg by mouth 2 (two) times daily. Takes 1 dose in PM on Mon/Wed/Fri due to dialysis.), Disp: 180 tablet, Rfl: 1 .  levothyroxine  (SYNTHROID )  25 MCG tablet, Take 1 tablet (25 mcg total) by mouth daily for low thyroid  hormone., Disp: 90 tablet, Rfl: 1 .  omeprazole  (PRILOSEC) 20 MG capsule, Take 2 capsules (40 mg total) by mouth daily for acid reflux., Disp: 180 capsule, Rfl: 1 .  polyethylene glycol powder (GLYCOLAX /MIRALAX ) 17 GM/SCOOP powder, Mix 1 capful with 8 ounces of fluid and take by mouth daily as needed., Disp: 3350 g, Rfl: 1 .  sevelamer  carbonate (RENVELA ) 800 MG tablet, Take 3 tablets (2,400 mg total) by mouth 3 (three) times daily with meals and 1 tablet with snacks., Disp: 1080 tablet, Rfl: 3 .  Accu-Chek Softclix Lancets lancets, Use to check blood sugar 3 times daily. (Patient not taking: Reported on 06/30/2024), Disp: 100 each, Rfl: 6 .  Blood Glucose Monitoring Suppl (ACCU-CHEK GUIDE) w/Device KIT, Use to check blood sugar 3 times daily. (Patient not taking: Reported on 07/07/2024), Disp: 1 kit, Rfl: 0 .  Continuous Glucose Receiver (FREESTYLE LIBRE 3 READER) DEVI, Use to check blood glucose continuously. Dx code: E11.59, Z79.4, Disp: 1 each, Rfl: 0 .  Continuous Glucose Sensor (FREESTYLE LIBRE 3 PLUS SENSOR) MISC, Use to continuously monitor blood glucose, changing the sensor every 15 days., Disp: 2 each, Rfl: 11 .  glucose  blood (ACCU-CHEK GUIDE) test strip, Use to check blood sugar 3 times daily. (Patient not taking: Reported on 06/30/2024), Disp: 100 each, Rfl: 6 .  insulin  glargine (LANTUS  SOLOSTAR) 100 UNIT/ML Solostar Pen, Inject 5 Units into the skin 2 (two) times daily. Increased by Provider on 6/24, Disp: 15 mL, Rfl: 2 .  Multiple Vitamin (MULTI-VITAMIN DAILY) TABS, Take 1 tablet by mouth at bedtime for supplementation (Patient not taking: Reported on 07/07/2024), Disp: 14 tablet, Rfl: 0 .  ondansetron  (ZOFRAN ) 4 MG tablet, Take 1 tablet (4 mg total) by mouth every 8 (eight) hours as needed for nausea. (Patient not taking: Reported on 07/07/2024), Disp: 90 tablet, Rfl: 3 .  Zinc  220 (50 Zn) MG CAPS, Take 1 capsule by  mouth daily for minerals, electrolytes (Patient not taking: Reported on 07/07/2024), Disp: 14 capsule, Rfl: 0 Allergies  Allergen Reactions  . Hydralazine  Other (See Comments)  . Hydralazine  Hcl Other (See Comments)    Hair loss  . Hydrocodone Itching and Other (See Comments)    Upset stomach  . Metformin  And Related Nausea And Vomiting and Other (See Comments)    Stomach pains, also  . Other Nausea Only and Other (See Comments)    Lettuce- Does not digest this!!  . Plaquenil [Hydroxychloroquine Sulfate] Hives  . Shellfish Allergy Nausea And Vomiting  . Shellfish-Derived Products Nausea Only and Other (See Comments)    Caused an upset stomach  . Shrimp (Diagnostic) Nausea Only and Other (See Comments)    Upset stomach   . Sulfa Antibiotics Hives  . Sulfur  Hives     Social History   Socioeconomic History  . Marital status: Legally Separated    Spouse name: Lamar  . Number of children: 1  . Years of education: some colle  . Highest education level: Not on file  Occupational History  . Occupation: Employed FT as Warden/ranger    Comment: [employer] , NA  Tobacco Use  . Smoking status: Former    Current packs/day: 0.25    Average packs/day: 0.3 packs/day for 0.5 years (0.1 ttl pk-yrs)    Types: Cigarettes    Passive exposure: Never  . Smokeless tobacco: Never  . Tobacco comments:    Former smoker 11/03/21  Vaping Use  . Vaping status: Never Used  Substance and Sexual Activity  . Alcohol use: No  . Drug use: No  . Sexual activity: Not Currently    Birth control/protection: None  Other Topics Concern  . 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 Difficulty of Paying Living Expenses: Not hard at all  Food Insecurity: No Food Insecurity (02/04/2024)   Hunger Vital Sign    . Worried About Programme researcher, broadcasting/film/video in the Last Year: Never true   . Ran Out of Food in the Last Year: Never true  Transportation Needs: No Transportation Needs (02/04/2024)   PRAPARE - Transportation   . Lack of Transportation (Medical): No   . Lack of Transportation (Non-Medical): No  Physical Activity: Inactive (02/04/2024)   Exercise Vital Sign   . Days of Exercise per Week: 0 days   . Minutes of Exercise per Session: 0 min  Stress: No Stress Concern Present (02/04/2024)   [employer] of Occupational Health - Occupational Stress Questionnaire   .  Feeling of Stress : Not at all  Social Connections: Moderately Integrated (02/04/2024)   Social Connection and Isolation Panel   . Frequency of Communication with Friends and Family: Twice a week   . Frequency of Social Gatherings with Friends and Family: Once a week   . Attends Religious Services: Never   . Active Member of Clubs or Organizations: Yes   . Attends Banker Meetings: Never   . Marital Status: Married  Catering manager Violence: Not At Risk (02/04/2024)   Humiliation, Afraid, Rape, and Kick questionnaire   . Fear of Current or Ex-Partner: No   . Emotionally Abused: No   . Physically Abused: No   . Sexually Abused: No    Physical Exam      Future Appointments  Date Time Provider Department Center  07/16/2024 11:30 AM [facility] PA/NP [facility] None  08/27/2024  1:20 PM Dartha Ernst, MD [facility] None  09/17/2024 11:00 AM [facility]  09/17/2024 11:40 AM Lanis Fonda BRAVO, MD [facility]  10/06/2024 11:10 AM [name], MD [facility] None

## 2024-07-13 ENCOUNTER — Other Ambulatory Visit (HOSPITAL_COMMUNITY): Payer: Self-pay | Admitting: Internal Medicine

## 2024-07-13 ENCOUNTER — Other Ambulatory Visit (HOSPITAL_COMMUNITY): Payer: Self-pay

## 2024-07-13 ENCOUNTER — Other Ambulatory Visit: Payer: Self-pay

## 2024-07-13 DIAGNOSIS — K219 Gastro-esophageal reflux disease without esophagitis: Secondary | ICD-10-CM

## 2024-07-13 MED ORDER — OMEPRAZOLE 20 MG PO CPDR
40.0000 mg | DELAYED_RELEASE_CAPSULE | Freq: Every day | ORAL | 0 refills | Status: DC
  Filled 2024-08-10 (×2): qty 180, 90d supply, fill #0
  Filled ????-??-??: fill #0

## 2024-07-14 ENCOUNTER — Other Ambulatory Visit (HOSPITAL_COMMUNITY): Payer: Self-pay

## 2024-07-14 ENCOUNTER — Other Ambulatory Visit (HOSPITAL_COMMUNITY): Payer: Self-pay | Admitting: Emergency Medicine

## 2024-07-14 ENCOUNTER — Other Ambulatory Visit: Payer: Self-pay

## 2024-07-14 NOTE — Progress Notes (Unsigned)
 Paramedicine Encounter    Patient ID: Nancy [name], female    DOB: 20-Mar-1961, 63 y.o.   MRN: [id number]   Complaints***  Assessment***  Compliance with meds***  Pill box filled***  Refills needed Alprazolam   Meds changes since last visit***    Social changes***   There were no vitals taken for this visit. Weight yesterday-*** Last visit weight-***  ACTION: {Paramed Action:[phone]}  Mary Sharps, EMT-Paramedic [phone] 07/14/24  Patient Care Team: [name], MD as PCP - General (Internal Medicine) Court Dorn PARAS, MD as PCP - Cardiology (Cardiology) Rolan Ezra RAMAN, MD as PCP - [facility] (Cardiology) [facility], [facility]  Patient Active Problem List   Diagnosis Date Noted  . Irritable bowel syndrome with constipation 06/04/2024  . S/P bilateral BKA (below knee amputation) (HCC) 02/04/2024  . History of CVA (cerebrovascular accident) 03/22/2023  . Community acquired pneumonia 03/22/2023  . Pain, unspecified 08/15/2022  . Complication of vascular dialysis catheter 08/07/2022  . Chest pain 05/30/2022  . Fluid overload 05/30/2022  . Iron  deficiency anemia, unspecified 04/26/2022  . Nausea 04/10/2022  . Hyponatremia 04/04/2022  . Prolonged QT interval 04/04/2022  . Anaphylactic shock, unspecified, initial encounter 02/23/2022  . Other allergy, initial encounter 02/23/2022  . Pressure injury of skin 02/19/2022  . Anemia in chronic kidney disease 02/16/2022  . 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 Hyperlipidemia, unspecified 02/16/2022  . Hypertensive heart and chronic kidney disease with heart failure and with stage 5 chronic kidney disease, or end stage renal disease (HCC) 02/16/2022  . Personal history of nicotine dependence 02/16/2022  . Personal history of transient ischemic attack (TIA), and cerebral infarction without residual deficits 02/16/2022  . Secondary hyperparathyroidism of renal origin (HCC)  02/16/2022  . Renal anasarca 02/14/2022  . ESRD on dialysis (HCC) 02/14/2022  . Hyperkalemia 02/14/2022  . S/P arteriovenous (AV) fistula creation 12/22/2021  . Peripheral artery disease (HCC) 10/27/2021  . Acute exacerbation of congestive heart failure (HCC) 10/24/2021  . Acute cardiogenic pulmonary edema (HCC) 10/06/2021  . CAD (coronary artery disease) 10/06/2021  . Goals of care, counseling/discussion   . Gangrene of left foot (HCC)   . Cellulitis of left foot   . CHF (congestive heart failure) (HCC) 07/07/2021  . Acute postoperative anemia due to expected blood loss superimposed on anemia of chronic renal insufficiency 06/30/2021  . CKD (chronic kidney disease), stage IV (HCC) 06/11/2021  . Chronic diastolic CHF (congestive heart failure) (HCC) 06/10/2021  . Abnormal nuclear stress test   . Dyspnea on exertion 03/07/2021  . Orthopnea 02/25/2021  . History of cerebrovascular accident (CVA) with residual deficit 05/06/2020  . AKI (acute kidney injury) (HCC) 04/20/2020  . Generalized weakness 04/20/2020  . Dehydration 04/20/2020  . Generalized abdominal pain   . Pancreatitis, acute 02/29/2020  . Cerebrovascular accident (CVA) (HCC) 11/08/2019  . Stenosis of right carotid artery 11/08/2019  . Cortical age-related cataract of both eyes 08/30/2019  . Gait abnormality 08/30/2019  . Statin declined 08/30/2019  . Gastroesophageal reflux disease without esophagitis 04/18/2018  . 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 Insulin  dependent type 2 diabetes mellitus (HCC) 10/29/2017  . History of macular degeneration 10/29/2017  . Chronic cervical pain 10/29/2016  . Myalgia 09/14/2016  . Insomnia 09/14/2016  . Tinea pedis of both feet 08/20/2016  . Macular degeneration 08/17/2016  . Low back pain 09/21/2014  . Hypertensive urgency 09/21/2014  . HTN (hypertension) 09/08/2014  . Thyroid  nodule 08/17/2014  . Morbid obesity (HCC) 08/17/2014  .  Hypokalemia 08/06/2014  . Type II diabetes mellitus  with neurological manifestations, uncontrolled 08/04/2014    Current Outpatient Medications:  .  acetaminophen  (TYLENOL ) 500 MG tablet, Take 1,000 mg by mouth every 8 (eight) hours as needed for mild pain (pain score 1-3), moderate pain (pain score 4-6) or headache., Disp: , Rfl:  .  albuterol  (VENTOLIN  HFA) 108 (90 Base) MCG/ACT inhaler, Inhale 2 puffs into the lungs every 6 (six) hours as needed., Disp: 18 g, Rfl: 3 .  ALPRAZolam  (XANAX ) 0.25 MG tablet, Take 1 tablet (0.25 mg total) by mouth on Monday, Wednesday and Friday at hemodialysis., Disp: 12 tablet, Rfl: 2 .  amLODipine  (NORVASC ) 10 MG tablet, Take 1 tablet (10 mg total) by mouth daily for hypertension, Disp: 90 tablet, Rfl: 1 .  aspirin  81 MG EC tablet, Take 1 tablet (81 mg total) by mouth daily., Disp: 60 tablet, Rfl: 1 .  atorvastatin  (LIPITOR) 40 MG tablet, Take 1 tablet (40 mg total) by mouth daily for cholesterol., Disp: 90 tablet, Rfl: 1 .  ezetimibe  (ZETIA ) 10 MG tablet, Take 1 tablet (10 mg total) by mouth daily for cholesterol., Disp: 90 tablet, Rfl: 1 .  insulin  glargine (LANTUS  SOLOSTAR) 100 UNIT/ML Solostar Pen, Inject 5 Units into the skin 2 (two) times daily. Increased by Provider on 6/24, Disp: 15 mL, Rfl: 2 .  Insulin  Pen Needle (PEN NEEDLES) 31G X 8 MM MISC, Use for injection with insulin  pen daily., Disp: 100 each, Rfl: 6 .  isosorbide  mononitrate (IMDUR ) 30 MG 24 hr tablet, Take 2 tablets (60 mg total) by mouth daily for blood pressure. (Patient taking differently: Take 60 mg by mouth daily. Mon/wed/fri takes dose in the evening due to dialysis), Disp: 180 tablet, Rfl: 1 .  levothyroxine  (SYNTHROID ) 25 MCG tablet, Take 1 tablet (25 mcg total) by mouth daily for low thyroid  hormone., Disp: 90 tablet, Rfl: 1 .  omeprazole  (PRILOSEC) 20 MG capsule, Take 2 capsules (40 mg total) by mouth daily for acid reflux., Disp: 180 capsule, Rfl: 0 .  polyethylene glycol powder (GLYCOLAX /MIRALAX ) 17 GM/SCOOP powder, Mix 1 capful with 8  ounces of fluid and take by mouth daily as needed., Disp: 3350 g, Rfl: 1 .  sevelamer  carbonate (RENVELA ) 800 MG tablet, Take 3 tablets (2,400 mg total) by mouth 3 (three) times daily with meals and 1 tablet with snacks., Disp: 1080 tablet, Rfl: 3 .  Accu-Chek Softclix Lancets lancets, Use to check blood sugar 3 times daily. (Patient not taking: Reported on 07/14/2024), Disp: 100 each, Rfl: 6 .  Blood Glucose Monitoring Suppl (ACCU-CHEK GUIDE) w/Device KIT, Use to check blood sugar 3 times daily. (Patient not taking: Reported on 07/14/2024), Disp: 1 kit, Rfl: 0 .  Continuous Glucose Receiver (FREESTYLE LIBRE 3 READER) DEVI, Use to check blood glucose continuously. Dx code: E11.59, Z79.4, Disp: 1 each, Rfl: 0 .  Continuous Glucose Sensor (FREESTYLE LIBRE 3 PLUS SENSOR) MISC, Use to continuously monitor blood glucose, changing the sensor every 15 days., Disp: 2 each, Rfl: 11 .  glucose blood (ACCU-CHEK GUIDE) test strip, Use to check blood sugar 3 times daily. (Patient not taking: Reported on 07/14/2024), Disp: 100 each, Rfl: 6 .  labetalol  (NORMODYNE ) 300 MG tablet, Take 1 tablet (300 mg total) by mouth 2 (two) times daily. (Patient taking differently: Take 300 mg by mouth 2 (two) times daily. Takes 1 dose in PM on Mon/Wed/Fri due to dialysis.), Disp: 180 tablet, Rfl: 1 .  Multiple Vitamin (MULTI-VITAMIN DAILY) TABS, Take 1 tablet  by mouth at bedtime for supplementation (Patient not taking: Reported on 07/14/2024), Disp: 14 tablet, Rfl: 0 .  ondansetron  (ZOFRAN ) 4 MG tablet, Take 1 tablet (4 mg total) by mouth every 8 (eight) hours as needed for nausea. (Patient not taking: Reported on 07/14/2024), Disp: 90 tablet, Rfl: 3 .  Zinc  220 (50 Zn) MG CAPS, Take 1 capsule by mouth daily for minerals, electrolytes (Patient not taking: Reported on 07/14/2024), Disp: 14 capsule, Rfl: 0 Allergies  Allergen Reactions  . Hydralazine  Other (See Comments)  . Hydralazine  Hcl Other (See Comments)    Hair loss  . Hydrocodone  Itching and Other (See Comments)    Upset stomach  . Metformin  And Related Nausea And Vomiting and Other (See Comments)    Stomach pains, also  . Other Nausea Only and Other (See Comments)    Lettuce- Does not digest this!!  . Plaquenil [Hydroxychloroquine Sulfate] Hives  . Shellfish Allergy Nausea And Vomiting  . Shellfish-Derived Products Nausea Only and Other (See Comments)    Caused an upset stomach  . Shrimp (Diagnostic) Nausea Only and Other (See Comments)    Upset stomach   . Sulfa Antibiotics Hives  . Sulfur  Hives     Social History   Socioeconomic History  . Marital status: Legally Separated    Spouse name: Lamar  . Number of children: 1  . Years of education: some colle  . Highest education level: Not on file  Occupational History  . Occupation: Employed FT as Warden/ranger    Comment: [employer] , [place]  Tobacco Use  . Smoking status: Former    Current packs/day: 0.25    Average packs/day: 0.3 packs/day for 0.5 years (0.1 ttl pk-yrs)    Types: Cigarettes    Passive exposure: Never  . Smokeless tobacco: Never  . Tobacco comments:    Former smoker 11/03/21  Vaping Use  . Vaping status: Never Used  Substance and Sexual Activity  . Alcohol use: No  . Drug use: No  . Sexual activity: Not Currently    Birth control/protection: None  Other Topics Concern  . 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 Difficulty of Paying Living Expenses: Not hard at all  Food Insecurity: No Food Insecurity (02/04/2024)   Hunger Vital Sign   . Worried About Programme researcher, broadcasting/film/video in the Last Year: Never true   . Ran Out of Food in the Last Year: Never true  Transportation Needs: No Transportation Needs (02/04/2024)   PRAPARE - Transportation   . Lack of Transportation (Medical): No   .  Lack of Transportation (Non-Medical): No  Physical Activity: Inactive (02/04/2024)   Exercise Vital Sign   . Days of Exercise per Week: 0 days   . Minutes of Exercise per Session: 0 min  Stress: No Stress Concern Present (02/04/2024)   [employer] of Occupational Health - Occupational Stress Questionnaire   . Feeling of Stress : Not at all  Social Connections: Moderately Integrated (02/04/2024)   Social Connection and Isolation Panel   . Frequency of Communication with Friends and Family: Twice a week   . Frequency of Social Gatherings with Friends and Family: Once a week   . Attends Religious Services: Never   . Active Member of Clubs or Organizations: Yes   .  Attends Banker Meetings: Never   . Marital Status: Married  Catering manager Violence: Not At Risk (02/04/2024)   Humiliation, Afraid, Rape, and Kick questionnaire   . Fear of Current or Ex-Partner: No   . Emotionally Abused: No   . Physically Abused: No   . Sexually Abused: No    Physical Exam      Future Appointments  Date Time Provider Department Center  07/16/2024 11:30 AM [facility] PA/NP [facility] None  08/27/2024  1:20 PM Dartha Ernst, MD [facility] None  09/17/2024 11:00 AM [facility] H&V  09/17/2024 11:40 AM Lanis Fonda BRAVO, MD [facility] H&V  10/06/2024 11:10 AM [name], MD [facility] None

## 2024-07-15 ENCOUNTER — Telehealth (HOSPITAL_COMMUNITY): Payer: Self-pay

## 2024-07-15 ENCOUNTER — Other Ambulatory Visit: Payer: Self-pay

## 2024-07-15 NOTE — Telephone Encounter (Signed)
 Called to confirm/remind patient of their appointment at the [facility] on 07/16/2024 11:30.   Appointment:   [] Confirmed  [x] Left mess   [] No answer/No voice mail  [] VM Full/unable to leave message  [] Phone not in service  Patient reminded to bring all medications and/or complete list.  Confirmed patient has transportation. Gave directions, instructed to utilize valet parking.

## 2024-07-16 ENCOUNTER — Ambulatory Visit (HOSPITAL_COMMUNITY)
Admission: RE | Admit: 2024-07-16 | Discharge: 2024-07-16 | Disposition: A | Discharge status: Home / Self Care | Source: Ambulatory Visit | Attending: Physician Assistant | Admitting: Physician Assistant

## 2024-07-16 ENCOUNTER — Encounter (HOSPITAL_COMMUNITY): Payer: Self-pay

## 2024-07-16 VITALS — BP 130/80 | HR 88 | Wt 248.2 lb

## 2024-07-16 DIAGNOSIS — G8929 Other chronic pain: Secondary | ICD-10-CM | POA: Diagnosis not present

## 2024-07-16 DIAGNOSIS — I132 Hypertensive heart and chronic kidney disease with heart failure and with stage 5 chronic kidney disease, or end stage renal disease: Secondary | ICD-10-CM | POA: Diagnosis not present

## 2024-07-16 DIAGNOSIS — Z89511 Acquired absence of right leg below knee: Secondary | ICD-10-CM | POA: Diagnosis not present

## 2024-07-16 DIAGNOSIS — I5032 Chronic diastolic (congestive) heart failure: Secondary | ICD-10-CM | POA: Insufficient documentation

## 2024-07-16 DIAGNOSIS — Z8249 Family history of ischemic heart disease and other diseases of the circulatory system: Secondary | ICD-10-CM | POA: Diagnosis not present

## 2024-07-16 DIAGNOSIS — Z79899 Other long term (current) drug therapy: Secondary | ICD-10-CM | POA: Insufficient documentation

## 2024-07-16 DIAGNOSIS — I2510 Atherosclerotic heart disease of native coronary artery without angina pectoris: Secondary | ICD-10-CM | POA: Diagnosis not present

## 2024-07-16 DIAGNOSIS — R0601 Orthopnea: Secondary | ICD-10-CM | POA: Diagnosis not present

## 2024-07-16 DIAGNOSIS — I10 Essential (primary) hypertension: Secondary | ICD-10-CM

## 2024-07-16 DIAGNOSIS — Z87891 Personal history of nicotine dependence: Secondary | ICD-10-CM | POA: Diagnosis not present

## 2024-07-16 DIAGNOSIS — M549 Dorsalgia, unspecified: Secondary | ICD-10-CM | POA: Diagnosis not present

## 2024-07-16 DIAGNOSIS — N186 End stage renal disease: Secondary | ICD-10-CM | POA: Insufficient documentation

## 2024-07-16 DIAGNOSIS — Z89512 Acquired absence of left leg below knee: Secondary | ICD-10-CM | POA: Diagnosis not present

## 2024-07-16 DIAGNOSIS — E1151 Type 2 diabetes mellitus with diabetic peripheral angiopathy without gangrene: Secondary | ICD-10-CM | POA: Insufficient documentation

## 2024-07-16 DIAGNOSIS — Z833 Family history of diabetes mellitus: Secondary | ICD-10-CM | POA: Insufficient documentation

## 2024-07-16 DIAGNOSIS — E785 Hyperlipidemia, unspecified: Secondary | ICD-10-CM | POA: Diagnosis not present

## 2024-07-16 DIAGNOSIS — E1140 Type 2 diabetes mellitus with diabetic neuropathy, unspecified: Secondary | ICD-10-CM | POA: Diagnosis not present

## 2024-07-16 DIAGNOSIS — Z8673 Personal history of transient ischemic attack (TIA), and cerebral infarction without residual deficits: Secondary | ICD-10-CM | POA: Insufficient documentation

## 2024-07-16 DIAGNOSIS — E1122 Type 2 diabetes mellitus with diabetic chronic kidney disease: Secondary | ICD-10-CM | POA: Insufficient documentation

## 2024-07-16 DIAGNOSIS — Z992 Dependence on renal dialysis: Secondary | ICD-10-CM | POA: Insufficient documentation

## 2024-07-16 DIAGNOSIS — Z794 Long term (current) use of insulin: Secondary | ICD-10-CM | POA: Diagnosis not present

## 2024-07-16 NOTE — Patient Instructions (Signed)
 There has been no changes to your medications.  Your physician recommends that you schedule a follow-up appointment in: 9 months ( April) ** PLEASE CALL THE OFFICE IN FEBRUARY 2026 TO ARRANGE YOUR FOLLOW UP APPOINTMENT.**  If you have any questions or concerns before your next appointment please send us  a message through [facility] or call our office at [phone].    TO LEAVE A MESSAGE FOR THE NURSE SELECT OPTION 2, PLEASE LEAVE A MESSAGE INCLUDING: YOUR NAME DATE OF BIRTH CALL BACK NUMBER REASON FOR CALL**this is important as we prioritize the call backs  YOU WILL RECEIVE A CALL BACK THE SAME DAY AS LONG AS YOU CALL BEFORE 4:00 PM  At the [facility], you and your health needs are our priority. As part of our continuing mission to provide you with exceptional heart care, we have created designated Provider Care Teams. These Care Teams include your primary Cardiologist (physician) and Advanced Practice Providers (APPs- Physician Assistants and Nurse Practitioners) who all work together to provide you with the care you need, when you need it.   You may see any of the following providers on your designated Care Team at your next follow up: Dr Toribio Fuel Dr Ezra Shuck Dr. Ria Commander Dr. Morene Brownie Amy Lenetta, NP Caffie Shed, [place] [facility] [place], [place] Beckey Coe, NP [place] Lee, NP Ellouise Class, NP Tinnie Redman, PharmD Jaun Bash, PharmD   Please be sure to bring in all your medications bottles to every appointment.    Thank you for choosing [facility]

## 2024-07-16 NOTE — Progress Notes (Addendum)
 Advanced Heart Failure Clinic Note   PCP: [name], MD PCP-Cardiologist: Dorn Lesches, MD  HF Cardiologist: Dr. Rolan  HPI: Nancy [name] 63 y.o. AAF w/ chronic diastolic heart failure, ESRD on HD, HTN, T2DM, HLD, CVA, PVD, carotid stenosis, and tobacco abuse.   Echo 11/2019 showed EF 55-60%, RV normal. No LVH. No significant valvular dysfunction   She was referred to Dr. Lesches for [facility] by PCP in April 2022. Complained of progressive exertional dyspnea, LEE and orthopnea. Echo 4/22 with EF 60-65%, GIIDD, mild LVH, RV normal. Cardiolite showed findings suggestive of possible anterior ischemia vs breast attenuation. R/LHC on 04/27/21 showed mild nonobstructive CAD and RHC hemodynamics c/w pulmonary HTN. Diuretics were increased w/ plans to refer to the [facility]. Dry wt felt to be ~228 lb.   Admitted 5/24-5/27/22=> discharged home on Lasix  40 daily.   Admitted 6/25-6/30/22 =>SCr bump to 3.2 (2.9 at d/c). Discharged home on Torsemide  40 daily. Echo EF 55-60%, RV normal.   Admitted 07/07/21= > direct admit from Dr. Ranee office w/ marked fluid overload, progressive wt gain, exertional dyspnea, LEE and orthopnea. Clinic BP elevated 172/85. SCr 2.6 on admit. Started on lasix  gtt. AHF team consulted to assist with further management. She underwent RHC, suggested ongoing volume overload with elevated PCWP and RA.  Prominent v-waves in PCWP but think due to severe diastolic dysfunction as there was no significant MR on review of echo.  PYP study in 7/22 was not suggestive of TTR cardiac amyloidosis.  Suspect severe diastolic dysfunction due to long-standing and poorly controlled HTN. SCr and potassium continued to climb and nephrology consulted. Her diuretics were adjusted and she was able to be discharged on low dose carvedilol  and torsemide  40 daily; weight 226 lbs.  Admitted 09/04/21 with a left heel ulcer secondary to diabetic neuropathy.  Work-up included right ABI of 0.45 and left ABI  of 0.5.  She was started on antibiotics, no osteomyelitis on MRI. Vascular consulted and did not pursue angiography as it could potentially push her into renal failure and require initiation of HD. Ortho recommended left transtibial amputation but she declined. Palliative care met with her but she did not want to discuss her health issues. There was no indication for HD, per nephrology, but AV fistula was recommended. She was not interested in this. PT recommended SNF however patient declined. She was discharged with Home Health.   Admitted 10/22 w/ acute on chronic HFpEF exacerbation w/ acute pulmonary edema in setting of hypertensive urgency. Acute exacerbation felt triggered by poor med compliance and dietary indiscretion w/ sodium + high fluid intake. Echo in 10/22 showed EF 50-55%, mod LVH, G2DD. Course c/b worsening CKD. SCr bumped to 3.12 (prior baseline ~ 2.5). Nephrology was consulted but felt no emergent indication for HD. She responded well to IV Lasix , though nephrology anticipated that she may be nearing need for HD soon. Plan is to follow in the outpatient setting. Also noted to have gangrenous pressure ulcers of bilateral heels left and right dorsal feet. Now followed by wound care. Discharged home on torsemide  60 mg daily. D/c SCr 3.12. D/c wt 231 lb.   Admitted 11/22 with A/C CHF exacerbation and left foot osteomyelitis. She was diuresed with IV lasix  and transitioned to po. GDMT limited by CKD IV. Ortho surgery again recommended amputation, she continued to decline. She was also noted to have wound on right metatarsal, XR showed no osteomyelitis. She received bedside debridement and wound care to left foot. She was discharged home  with plans for on-going wound care, weight 245 lbs.  Received Lasix  80 mg IV + metolazone  2.5 at home for volume overload 12/13/21 via [facility]. No improvement in symptoms and she was advised to go to ED. Received further IV Lasix  and felt better. Discharged  home.   Admitted 12/22/21 for respiratory distress requiring intubation after her AVF creation surgery.  Peripheral arterial dopplers in 1/23 with moderate right and severe left SFA stenosis.    She is here today for CHF follow-up. Followed by [facility] in the community.  She is accompanied by her husband who assists with the history. Denies dyspnea and lower extremity edema. Notes chronic orthopnea and has been unable to lie flat for about 5 years. She has bilateral prostheses, she is able to ambulate short distances in her house. It is difficult to walk further d/t back pain. Follows with GI for nausea, abdominal cramping and bloating. Felt to likely have IBS +/- gastroparesis. She is compliant with medications. Does not take BP meds until after dialysis on HD days d/t hypotension.    PMH:  1. CKD stage IV 2. Chronic diastolic CHF: PYP scan (7/22).  - RHC (7/22): mean RA 13, PA 68/22 mean 42, mean PCWP 25, CI 2.65, PVR 3.1. - Echo (10/22): EF 50-55%, moderate LVH, normal RV size and systolic function, normal IVC.  3. HTN 4. Peripheral arterial disease:  - Peripheral arterial dopplers (1/23): right 50-74% distal SFA, left 75-99% SFA, bilateral tibial disease.  5. Carotid stenosis: Dopplers (5/22) with totally occluded RICA, 40-59% LICA.  6. Former smoker.  7. Type 2 diabetes 8. CAD: LHC (5/22) with 50% mLAD.  9. CVA: 2020.  10. Hyperlipidemia 11. Depression 12. PAD: s/p bilateral BKA  ROS: All systems reviewed and negative except as per HPI.   Current Outpatient Medications  Medication Sig Dispense Refill   Accu-Chek Softclix Lancets lancets Use to check blood sugar 3 times daily. (Patient not taking: Reported on 07/14/2024) 100 each 6   acetaminophen  (TYLENOL ) 500 MG tablet Take 1,000 mg by mouth every 8 (eight) hours as needed for mild pain (pain score 1-3), moderate pain (pain score 4-6) or headache.     albuterol  (VENTOLIN  HFA) 108 (90 Base) MCG/ACT inhaler Inhale 2 puffs into  the lungs every 6 (six) hours as needed. 18 g 3   ALPRAZolam  (XANAX ) 0.25 MG tablet Take 1 tablet (0.25 mg total) by mouth on Monday, Wednesday and Friday at hemodialysis. 12 tablet 2   amLODipine  (NORVASC ) 10 MG tablet Take 1 tablet (10 mg total) by mouth daily for hypertension 90 tablet 1   aspirin  81 MG EC tablet Take 1 tablet (81 mg total) by mouth daily. 60 tablet 1   atorvastatin  (LIPITOR) 40 MG tablet Take 1 tablet (40 mg total) by mouth daily for cholesterol. 90 tablet 1   Blood Glucose Monitoring Suppl (ACCU-CHEK GUIDE) w/Device KIT Use to check blood sugar 3 times daily. (Patient not taking: Reported on 07/14/2024) 1 kit 0   Continuous Glucose Receiver (FREESTYLE LIBRE 3 READER) DEVI Use to check blood glucose continuously. Dx code: E11.59, Z79.4 1 each 0   Continuous Glucose Sensor (FREESTYLE LIBRE 3 PLUS SENSOR) MISC Use to continuously monitor blood glucose, changing the sensor every 15 days. 2 each 11   ezetimibe  (ZETIA ) 10 MG tablet Take 1 tablet (10 mg total) by mouth daily for cholesterol. 90 tablet 1   glucose blood (ACCU-CHEK GUIDE) test strip Use to check blood sugar 3 times daily. (Patient not taking:  Reported on 07/14/2024) 100 each 6   insulin  glargine (LANTUS  SOLOSTAR) 100 UNIT/ML Solostar Pen Inject 5 Units into the skin 2 (two) times daily. Increased by Provider on 6/24 15 mL 2   Insulin  Pen Needle (PEN NEEDLES) 31G X 8 MM MISC Use for injection with insulin  pen daily. 100 each 6   isosorbide  mononitrate (IMDUR ) 30 MG 24 hr tablet Take 2 tablets (60 mg total) by mouth daily for blood pressure. (Patient taking differently: Take 60 mg by mouth daily. Mon/wed/fri takes dose in the evening due to dialysis) 180 tablet 1   labetalol  (NORMODYNE ) 300 MG tablet Take 1 tablet (300 mg total) by mouth 2 (two) times daily. (Patient taking differently: Take 300 mg by mouth 2 (two) times daily. Takes 1 dose in PM on Mon/Wed/Fri due to dialysis.) 180 tablet 1   levothyroxine  (SYNTHROID ) 25 MCG  tablet Take 1 tablet (25 mcg total) by mouth daily for low thyroid  hormone. 90 tablet 1   Multiple Vitamin (MULTI-VITAMIN DAILY) TABS Take 1 tablet by mouth at bedtime for supplementation (Patient not taking: Reported on 07/14/2024) 14 tablet 0   omeprazole  (PRILOSEC) 20 MG capsule Take 2 capsules (40 mg total) by mouth daily for acid reflux. 180 capsule 0   ondansetron  (ZOFRAN ) 4 MG tablet Take 1 tablet (4 mg total) by mouth every 8 (eight) hours as needed for nausea. (Patient not taking: Reported on 07/14/2024) 90 tablet 3   polyethylene glycol powder (GLYCOLAX /MIRALAX ) 17 GM/SCOOP powder Mix 1 capful with 8 ounces of fluid and take by mouth daily as needed. 3350 g 1   sevelamer  carbonate (RENVELA ) 800 MG tablet Take 3 tablets (2,400 mg total) by mouth 3 (three) times daily with meals and 1 tablet with snacks. 1080 tablet 3   Zinc  220 (50 Zn) MG CAPS Take 1 capsule by mouth daily for minerals, electrolytes (Patient not taking: Reported on 07/14/2024) 14 capsule 0   No current facility-administered medications for this visit.   Allergies  Allergen Reactions   Hydralazine  Other (See Comments)   Hydralazine  Hcl Other (See Comments)    Hair loss   Hydrocodone Itching and Other (See Comments)    Upset stomach   Metformin  And Related Nausea And Vomiting and Other (See Comments)    Stomach pains, also   Other Nausea Only and Other (See Comments)    Lettuce- Does not digest this!!   Plaquenil [Hydroxychloroquine Sulfate] Hives   Shellfish Allergy Nausea And Vomiting   Shellfish-Derived Products Nausea Only and Other (See Comments)    Caused an upset stomach   Shrimp (Diagnostic) Nausea Only and Other (See Comments)    Upset stomach    Sulfa Antibiotics Hives   Sulfur  Hives   Social History   Socioeconomic History   Marital status: Legally Separated    Spouse name: Lamar   Number of children: 1   Years of education: some colle   Highest education level: Not on file  Occupational History    Occupation: Employed FT as Warden/ranger    Comment: [employer] , NA  Tobacco Use   Smoking status: Former    Current packs/day: 0.25    Average packs/day: 0.3 packs/day for 0.5 years (0.1 ttl pk-yrs)    Types: Cigarettes    Passive exposure: Never   Smokeless tobacco: Never   Tobacco comments:    Former smoker 11/03/21  Vaping Use   Vaping status: Never Used  Substance and Sexual Activity   Alcohol use: No   Drug  use: No   Sexual activity: Not Currently    Birth control/protection: None  Other Topics Concern   Not on file  Social History Narrative   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    Social Drivers of Health   Financial Resource Strain: Low Risk  (02/04/2024)   Overall Financial Resource Strain (CARDIA)    Difficulty of Paying Living Expenses: Not hard at all  Food Insecurity: No Food Insecurity (02/04/2024)   Hunger Vital Sign    Worried About Running Out of Food in the Last Year: Never true    Ran Out of Food in the Last Year: Never true  Transportation Needs: No Transportation Needs (02/04/2024)   PRAPARE - Administrator, Civil Service (Medical): No    Lack of Transportation (Non-Medical): No  Physical Activity: Inactive (02/04/2024)   Exercise Vital Sign    Days of Exercise per Week: 0 days    Minutes of Exercise per Session: 0 min  Stress: No Stress Concern Present (02/04/2024)   [employer] of Occupational Health - Occupational Stress Questionnaire    Feeling of Stress : Not at all  Social Connections: Moderately Integrated (02/04/2024)   Social Connection and Isolation Panel    Frequency of Communication with Friends and Family: Twice a week    Frequency of Social Gatherings with Friends and Family: Once a week    Attends Religious Services: Never    Database administrator or Organizations: Yes    Attends Banker Meetings: Never    Marital Status: Married  Catering manager Violence: Not At  Risk (02/04/2024)   Humiliation, Afraid, Rape, and Kick questionnaire    Fear of Current or Ex-Partner: No    Emotionally Abused: No    Physically Abused: No    Sexually Abused: No   Family History  Problem Relation Age of Onset   Hypertension Mother    Heart disease Mother    Diabetes Mother    Thyroid  disease Mother    Congestive Heart Failure Mother    Breast cancer Maternal Grandmother    Colon cancer Maternal Grandfather    Heart attack Sister    Heart disease Brother    Hyperlipidemia Brother    Hypertension Brother    Diabetes Father    Breast cancer Maternal Aunt    There were no vitals taken for this visit.  Wt Readings from Last 3 Encounters:  07/14/24 107.5 kg (237 lb)  07/07/24 108.1 kg (238 lb 6.4 oz)  06/30/24 105.7 kg (233 lb)   Physical Exam General: Chronically ill appearing. Arrived in wheelchair. Cor:  No JVD. Regular rate & rhythm. No murmurs. Lungs: clear Abdomen: obese, soft, nontender, nondistended.  Extremities: B/l BKA, wearing prostheses, no LE edema Neuro: alert & orientedx3. Affect pleasant   ASSESSMENT & PLAN: 1. Chronic diastolic CHF: Echo in 6/22 showed EF 55-60%, moderate LVH, normal RV, dilated IVC.  R/LHC in 5/22 showed nonobstructive CAD, pulmonary hypertension .  Multiple admissions recently with CHF, complicated by CKD stage IV.  RHC in 7/22 suggested ongoing volume overload with elevated PCWP and RA.  Prominent v-waves in PCWP but think due to severe diastolic dysfunction as there is no significant MR on review of recent echo.  PYP study in 7/22 was not suggestive of TTR cardiac amyloidosis.  Echo 11/24: EF 55-60%. Suspect severe diastolic dysfunction due to long-standing and poorly controlled HTN.  NYHA III. Confounded by bilateral BKA, deconditioning and chronic  back pain. She does not appear volume overloaded. Volume managed with HD.  - Continue with paramedicine. Appreciate assistance. 2. CAD: LHC on 5/22, she has mild to moderate  segmental proximal mid and distal LAD disease. Her circumflex and RCA did not have significant disease.  No chest pain. - Continue Aspirin  + statin + zetia  3. ESRD: Now on HD. Reports tolerating it well.  - She sees Dr. Gearline. 4. PAD: Previous bilateral gangrenous pressure ulcers.  - She is now s/p bilateral BKA 03/2022. She has prostheses. - Continue aspirin  + statin. 5. HTN:  - Continue amlodipine  10 mg daily + labetalol  300 mg BID + Imdur  60 mg daily - Holds BP meds AM of dialysis - BP reasonably controlled 6. Carotid disease: Occluded R ICA, moderate L ICA on duplex in 03/22. - Continue ASA, statin. 7. DM II: Insulin  dependent - Last A1c was 7.7  Follow up 9 months with Dr. Rolan. If stable at that time could consider graduating from [facility]. Her volume has been much easier to manage now that she is on HD.  Hussam Muniz N PA-C 07/16/2024 8:40 AM

## 2024-07-20 ENCOUNTER — Other Ambulatory Visit (HOSPITAL_BASED_OUTPATIENT_CLINIC_OR_DEPARTMENT_OTHER): Payer: Self-pay | Admitting: Otolaryngology

## 2024-07-20 DIAGNOSIS — D110 Benign neoplasm of parotid gland: Secondary | ICD-10-CM

## 2024-07-20 DIAGNOSIS — K118 Other diseases of salivary glands: Secondary | ICD-10-CM

## 2024-07-21 ENCOUNTER — Other Ambulatory Visit (HOSPITAL_COMMUNITY): Payer: Self-pay | Admitting: Emergency Medicine

## 2024-07-21 NOTE — Progress Notes (Signed)
 Pt w/ diarrhea today.  She has no other complaints and request only med rec today.  2 med boxes reconciled and follow up visit scheduled for 8/19.  She has an ultrasound appt at [facility] and will call when she gets home from that appointent and I will come by for a home visit.

## 2024-08-04 ENCOUNTER — Ambulatory Visit (HOSPITAL_BASED_OUTPATIENT_CLINIC_OR_DEPARTMENT_OTHER)
Admission: RE | Admit: 2024-08-04 | Discharge: 2024-08-04 | Disposition: A | Discharge status: Home / Self Care | Source: Ambulatory Visit | Attending: Otolaryngology | Admitting: Otolaryngology

## 2024-08-04 DIAGNOSIS — K118 Other diseases of salivary glands: Secondary | ICD-10-CM | POA: Insufficient documentation

## 2024-08-04 DIAGNOSIS — D110 Benign neoplasm of parotid gland: Secondary | ICD-10-CM | POA: Diagnosis present

## 2024-08-09 ENCOUNTER — Inpatient Hospital Stay (HOSPITAL_COMMUNITY)
Admission: EM | Admit: 2024-08-09 | Discharge: 2024-08-14 | DRG: 385 | Disposition: A | Discharge status: Home Health | Attending: Internal Medicine | Admitting: Internal Medicine

## 2024-08-09 ENCOUNTER — Other Ambulatory Visit: Payer: Self-pay

## 2024-08-09 DIAGNOSIS — Z8719 Personal history of other diseases of the digestive system: Secondary | ICD-10-CM

## 2024-08-09 DIAGNOSIS — Z992 Dependence on renal dialysis: Secondary | ICD-10-CM

## 2024-08-09 DIAGNOSIS — I2510 Atherosclerotic heart disease of native coronary artery without angina pectoris: Secondary | ICD-10-CM | POA: Diagnosis present

## 2024-08-09 DIAGNOSIS — Z882 Allergy status to sulfonamides status: Secondary | ICD-10-CM

## 2024-08-09 DIAGNOSIS — K625 Hemorrhage of anus and rectum: Secondary | ICD-10-CM | POA: Diagnosis present

## 2024-08-09 DIAGNOSIS — R6881 Early satiety: Secondary | ICD-10-CM | POA: Diagnosis present

## 2024-08-09 DIAGNOSIS — Z800 Family history of malignant neoplasm of digestive organs: Secondary | ICD-10-CM

## 2024-08-09 DIAGNOSIS — R933 Abnormal findings on diagnostic imaging of other parts of digestive tract: Secondary | ICD-10-CM

## 2024-08-09 DIAGNOSIS — Z89512 Acquired absence of left leg below knee: Secondary | ICD-10-CM

## 2024-08-09 DIAGNOSIS — Z7982 Long term (current) use of aspirin: Secondary | ICD-10-CM

## 2024-08-09 DIAGNOSIS — R1010 Upper abdominal pain, unspecified: Secondary | ICD-10-CM | POA: Diagnosis not present

## 2024-08-09 DIAGNOSIS — K529 Noninfective gastroenteritis and colitis, unspecified: Principal | ICD-10-CM | POA: Diagnosis present

## 2024-08-09 DIAGNOSIS — E785 Hyperlipidemia, unspecified: Secondary | ICD-10-CM | POA: Diagnosis present

## 2024-08-09 DIAGNOSIS — E66813 Obesity, class 3: Secondary | ICD-10-CM | POA: Diagnosis present

## 2024-08-09 DIAGNOSIS — G8929 Other chronic pain: Secondary | ICD-10-CM | POA: Diagnosis present

## 2024-08-09 DIAGNOSIS — Z8619 Personal history of other infectious and parasitic diseases: Secondary | ICD-10-CM

## 2024-08-09 DIAGNOSIS — Z833 Family history of diabetes mellitus: Secondary | ICD-10-CM

## 2024-08-09 DIAGNOSIS — E1151 Type 2 diabetes mellitus with diabetic peripheral angiopathy without gangrene: Secondary | ICD-10-CM | POA: Diagnosis present

## 2024-08-09 DIAGNOSIS — Z79899 Other long term (current) drug therapy: Secondary | ICD-10-CM

## 2024-08-09 DIAGNOSIS — K3184 Gastroparesis: Secondary | ICD-10-CM | POA: Diagnosis present

## 2024-08-09 DIAGNOSIS — E039 Hypothyroidism, unspecified: Secondary | ICD-10-CM | POA: Diagnosis present

## 2024-08-09 DIAGNOSIS — Z79890 Hormone replacement therapy (postmenopausal): Secondary | ICD-10-CM

## 2024-08-09 DIAGNOSIS — Z91018 Allergy to other foods: Secondary | ICD-10-CM

## 2024-08-09 DIAGNOSIS — Z91013 Allergy to seafood: Secondary | ICD-10-CM

## 2024-08-09 DIAGNOSIS — D631 Anemia in chronic kidney disease: Secondary | ICD-10-CM | POA: Diagnosis present

## 2024-08-09 DIAGNOSIS — N186 End stage renal disease: Secondary | ICD-10-CM | POA: Diagnosis present

## 2024-08-09 DIAGNOSIS — K51511 Left sided colitis with rectal bleeding: Secondary | ICD-10-CM | POA: Diagnosis not present

## 2024-08-09 DIAGNOSIS — N250 Renal osteodystrophy: Secondary | ICD-10-CM | POA: Diagnosis present

## 2024-08-09 DIAGNOSIS — I509 Heart failure, unspecified: Secondary | ICD-10-CM | POA: Diagnosis present

## 2024-08-09 DIAGNOSIS — K219 Gastro-esophageal reflux disease without esophagitis: Secondary | ICD-10-CM | POA: Diagnosis present

## 2024-08-09 DIAGNOSIS — Z794 Long term (current) use of insulin: Secondary | ICD-10-CM

## 2024-08-09 DIAGNOSIS — Z993 Dependence on wheelchair: Secondary | ICD-10-CM

## 2024-08-09 DIAGNOSIS — Z888 Allergy status to other drugs, medicaments and biological substances status: Secondary | ICD-10-CM

## 2024-08-09 DIAGNOSIS — F32A Depression, unspecified: Secondary | ICD-10-CM | POA: Diagnosis present

## 2024-08-09 DIAGNOSIS — Z8673 Personal history of transient ischemic attack (TIA), and cerebral infarction without residual deficits: Secondary | ICD-10-CM

## 2024-08-09 DIAGNOSIS — E1142 Type 2 diabetes mellitus with diabetic polyneuropathy: Secondary | ICD-10-CM | POA: Diagnosis present

## 2024-08-09 DIAGNOSIS — Z723 Lack of physical exercise: Secondary | ICD-10-CM

## 2024-08-09 DIAGNOSIS — R9431 Abnormal electrocardiogram [ECG] [EKG]: Secondary | ICD-10-CM | POA: Diagnosis present

## 2024-08-09 DIAGNOSIS — Z9049 Acquired absence of other specified parts of digestive tract: Secondary | ICD-10-CM

## 2024-08-09 DIAGNOSIS — Z635 Disruption of family by separation and divorce: Secondary | ICD-10-CM

## 2024-08-09 DIAGNOSIS — H3530 Unspecified macular degeneration: Secondary | ICD-10-CM | POA: Diagnosis present

## 2024-08-09 DIAGNOSIS — F410 Panic disorder [episodic paroxysmal anxiety] without agoraphobia: Secondary | ICD-10-CM | POA: Diagnosis present

## 2024-08-09 DIAGNOSIS — I132 Hypertensive heart and chronic kidney disease with heart failure and with stage 5 chronic kidney disease, or end stage renal disease: Secondary | ICD-10-CM | POA: Diagnosis present

## 2024-08-09 DIAGNOSIS — E1122 Type 2 diabetes mellitus with diabetic chronic kidney disease: Secondary | ICD-10-CM | POA: Diagnosis present

## 2024-08-09 DIAGNOSIS — R011 Cardiac murmur, unspecified: Secondary | ICD-10-CM | POA: Diagnosis present

## 2024-08-09 DIAGNOSIS — Z6841 Body Mass Index (BMI) 40.0 and over, adult: Secondary | ICD-10-CM

## 2024-08-09 DIAGNOSIS — Z8249 Family history of ischemic heart disease and other diseases of the circulatory system: Secondary | ICD-10-CM

## 2024-08-09 DIAGNOSIS — Z83438 Family history of other disorder of lipoprotein metabolism and other lipidemia: Secondary | ICD-10-CM

## 2024-08-09 DIAGNOSIS — Z885 Allergy status to narcotic agent status: Secondary | ICD-10-CM

## 2024-08-09 DIAGNOSIS — Z87891 Personal history of nicotine dependence: Secondary | ICD-10-CM

## 2024-08-09 DIAGNOSIS — Z90710 Acquired absence of both cervix and uterus: Secondary | ICD-10-CM

## 2024-08-09 DIAGNOSIS — Z89511 Acquired absence of right leg below knee: Secondary | ICD-10-CM

## 2024-08-09 DIAGNOSIS — E1143 Type 2 diabetes mellitus with diabetic autonomic (poly)neuropathy: Secondary | ICD-10-CM | POA: Diagnosis present

## 2024-08-09 LAB — COMPREHENSIVE METABOLIC PANEL WITH GFR
ALT: 18 U/L (ref 0–44)
AST: 17 U/L (ref 15–41)
Albumin: 3.6 g/dL (ref 3.5–5.0)
Alkaline Phosphatase: 93 U/L (ref 38–126)
Anion gap: 13 (ref 5–15)
BUN: 39 mg/dL — ABNORMAL HIGH (ref 8–23)
CO2: 23 mmol/L (ref 22–32)
Calcium: 9.2 mg/dL (ref 8.9–10.3)
Chloride: 97 mmol/L — ABNORMAL LOW (ref 98–111)
Creatinine, Ser: 7.38 mg/dL — ABNORMAL HIGH (ref 0.44–1.00)
GFR, Estimated: 6 mL/min — ABNORMAL LOW (ref >60)
Glucose, Bld: 172 mg/dL — ABNORMAL HIGH (ref 70–99)
Potassium: 5.3 mmol/L — ABNORMAL HIGH (ref 3.5–5.1)
Sodium: 133 mmol/L — ABNORMAL LOW (ref 135–145)
Total Bilirubin: 0.7 mg/dL (ref 0.0–1.2)
Total Protein: 7.5 g/dL (ref 6.5–8.1)

## 2024-08-09 LAB — LIPASE, BLOOD: Lipase: 28 U/L (ref 11–51)

## 2024-08-09 LAB — CBG MONITORING, ED: Glucose-Capillary: 145 mg/dL — ABNORMAL HIGH (ref 70–99)

## 2024-08-09 MED ORDER — FENTANYL CITRATE PF 50 MCG/ML IJ SOSY
50.0000 ug | PREFILLED_SYRINGE | INTRAMUSCULAR | Status: DC | PRN
  Administered 2024-08-09 – 2024-08-10 (×2): 50 ug via INTRAVENOUS
  Filled 2024-08-09 (×2): qty 1

## 2024-08-09 MED ORDER — LACTATED RINGERS IV BOLUS: 1000.0000 mL | Freq: Once | INTRAVENOUS | Status: DC

## 2024-08-09 MED ORDER — ONDANSETRON HCL 4 MG/2ML IJ SOLN
4.0000 mg | Freq: Once | INTRAMUSCULAR | Status: AC
  Administered 2024-08-09: 4 mg via INTRAVENOUS
  Filled 2024-08-09: qty 2

## 2024-08-09 MED ORDER — LACTATED RINGERS IV BOLUS
500.0000 mL | Freq: Once | INTRAVENOUS | Status: AC
  Administered 2024-08-09: 500 mL via INTRAVENOUS

## 2024-08-09 NOTE — ED Provider Notes (Signed)
 [place] EMERGENCY DEPARTMENT AT [facility] Provider Note   CSN: [id number] Arrival date & time: 08/09/24  2241     History Chief Complaint  Patient presents with   Abdominal Pain    HPI Nancy [name] is a 63 y.o. female presenting for abdominal pain/vomitting/diarrhea with trace BRB. Substantial medical history including ESRD, diabetes, hypertension.  Substantial diarrheal output and substantial nausea vomiting today.  Patient's recorded medical, surgical, social, medication list and allergies were reviewed in the Snapshot window as part of the initial history.   Review of Systems   Review of Systems  Constitutional:  Negative for chills and fever.  HENT:  Negative for ear pain and sore throat.   Eyes:  Negative for pain and visual disturbance.  Respiratory:  Negative for cough and shortness of breath.   Cardiovascular:  Negative for chest pain and palpitations.  Gastrointestinal:  Positive for abdominal pain, diarrhea, nausea and vomiting.  Genitourinary:  Negative for dysuria and hematuria.  Musculoskeletal:  Negative for arthralgias and back pain.  Skin:  Negative for color change and rash.  Neurological:  Negative for seizures and syncope.  All other systems reviewed and are negative.   Physical Exam Updated Vital Signs BP (!) 156/94   Pulse 85   Temp 97.8 F (36.6 C) (Oral)   Resp 15   Wt 108 kg   SpO2 99%   BMI 42.16 kg/m  Physical Exam Vitals and nursing note reviewed.  Constitutional:      General: She is not in acute distress.    Appearance: She is well-developed.  HENT:     Head: Normocephalic and atraumatic.  Eyes:     Conjunctiva/sclera: Conjunctivae normal.  Cardiovascular:     Rate and Rhythm: Normal rate and regular rhythm.     Heart sounds: No murmur heard. Pulmonary:     Effort: Pulmonary effort is normal. No respiratory distress.     Breath sounds: Normal breath sounds.  Abdominal:     General: There is no distension.      Palpations: Abdomen is soft.     Tenderness: There is no abdominal tenderness. There is no right CVA tenderness or left CVA tenderness.  Musculoskeletal:        General: No swelling or tenderness. Normal range of motion.     Cervical back: Neck supple.  Skin:    General: Skin is warm and dry.  Neurological:     General: No focal deficit present.     Mental Status: She is alert and oriented to person, place, and time. Mental status is at baseline.     Cranial Nerves: No cranial nerve deficit.      ED Course/ Medical Decision Making/ A&P Clinical Course as of 08/10/24 9350  [facility] Aug 09, 2024  2319 Glucose-Capillary(!): 145 [CC]    Clinical Course User Index [CC] [name], MD    Procedures Procedures   Medications Ordered in ED Medications  prochlorperazine  (COMPAZINE ) injection 5 mg (5 mg Intravenous Given 08/10/24 0527)  sodium chloride  flush (NS) 0.9 % injection 3 mL (has no administration in time range)  acetaminophen  (TYLENOL ) tablet 650 mg (has no administration in time range)    Or  acetaminophen  (TYLENOL ) suppository 650 mg (has no administration in time range)  insulin  aspart (novoLOG ) injection 0-6 Units (has no administration in time range)  HYDROmorphone  (DILAUDID ) injection 0.5 mg (has no administration in time range)  ondansetron  (ZOFRAN ) injection 4 mg (4 mg Intravenous Given 08/09/24 2346)  lactated ringers  bolus 500 mL (0 mLs Intravenous Stopped 08/10/24 0058)  fentaNYL  (SUBLIMAZE ) injection 50 mcg (50 mcg Intravenous Given 08/10/24 0101)  iohexol  (OMNIPAQUE ) 350 MG/ML injection 75 mL (75 mLs Intravenous Contrast Given 08/10/24 0100)  metroNIDAZOLE  (FLAGYL ) IVPB 500 mg (0 mg Intravenous Stopped 08/10/24 0453)   Medical Decision Making:   [name] is a 63 y.o. female who presented to the ED today with abdominal pain, detailed above.    Patient placed on continuous vitals and telemetry monitoring while in ED which was reviewed periodically.   Complete initial physical exam performed, notably the patient  was hemodynamically stable no acute distress.     Reviewed and confirmed nursing documentation for past medical history, family history, social history.    Initial Assessment:   With the patient's presentation of abdominal pain, most likely diagnosis is nonspecific etiology. Other diagnoses were considered including (but not limited to) gastroenteritis, colitis, small bowel obstruction, appendicitis, cholecystitis, pancreatitis, nephrolithiasis, UTI, pyleonephritis. These are considered less likely due to history of present illness and physical exam findings.   This is most consistent with an acute life/limb threatening illness complicated by underlying chronic conditions.   Initial Plan:  CBC/CMP to evaluate for underlying infectious/metabolic etiology for patient's abdominal pain  Lipase to evaluate for pancreatitis  EKG to evaluate for cardiac source of pain  CTAB/Pelvis with contrast to evaluate for structural/surgical etiology of patients' severe abdominal pain.  Urinalysis and repeat physical assessment to evaluate for UTI/Pyelonpehritis  Empiric management of symptoms with escalating pain control and antiemetics as needed.   Initial Study Results:   Laboratory  All laboratory results reviewed without evidence of clinically relevant pathology.   Exceptions include: WBC elevation    EKG EKG was reviewed independently. Rate, rhythm, axis, intervals all examined and without medically relevant abnormality. ST segments without concerns for elevations.    Radiology All images reviewed independently. Agree with radiology report at this time.   CT ABDOMEN PELVIS W CONTRAST Result Date: 08/10/2024 CLINICAL DATA:  upper abdominal pain that started today. Pt reports couple episodes of emesi EXAM: CT ABDOMEN AND PELVIS WITH CONTRAST TECHNIQUE: Multidetector CT imaging of the abdomen and pelvis was performed using the standard  protocol following bolus administration of intravenous contrast. RADIATION DOSE REDUCTION: This exam was performed according to the departmental dose-optimization program which includes automated exposure control, adjustment of the mA and/or kV according to patient size and/or use of iterative reconstruction technique. CONTRAST:  75mL OMNIPAQUE  IOHEXOL  350 MG/ML SOLN COMPARISON:  CT abdomen pelvis 05/30/2022 FINDINGS: Lower chest: Partially visualized central venous catheter with tip terminating within the right atrium. Tiny hiatal hernia. Hepatobiliary: No focal liver abnormality. Status post cholecystectomy. No biliary dilatation. Pancreas: No focal lesion. Normal pancreatic contour. No surrounding inflammatory changes. No main pancreatic ductal dilatation. Spleen: Normal in size without focal abnormality. Adrenals/Urinary Tract: No adrenal nodule bilaterally. Bilateral kidneys enhance symmetrically. No hydronephrosis. No hydroureter. The urinary bladder is unremarkable. Stomach/Bowel: Stomach is within normal limits. No evidence of small bowel wall thickening or dilatation. Few scattered small and large bowel diverticula. Descending colon bowel wall thickening and pericolonic fat stranding. Appendix appears normal. Vascular/Lymphatic: No abdominal aorta or iliac aneurysm. Mild atherosclerotic plaque of the aorta and its branches. No abdominal, pelvic, or inguinal lymphadenopathy. Reproductive: Status post hysterectomy. No adnexal masses. Other: No intraperitoneal free fluid. No intraperitoneal free gas. No organized fluid collection. Musculoskeletal: No abdominal wall hernia or abnormality. No suspicious lytic or blastic osseous lesions. No acute displaced fracture.  IMPRESSION: 1. Descending colitis. 2. Few scattered small and large bowel diverticula. No acute diverticulitis. 3. Tiny hiatal hernia. Electronically Signed   By: Morgane  Naveau M.D.   On: 08/10/2024 01:16   Final Reassessment and Plan:   Patient  findings are most consistent with acute colitis.  Possibly C. difficile given the bloody nature.  May be enteric coccal. Will treat with Flagyl  pending the C. difficile study.  Patient to provide sample. Disposition:   Based on the above findings, I believe this patient is stable for admission.    Patient/family educated about specific findings on our evaluation and explained exact reasons for admission.  Patient/family educated about clinical situation and time was allowed to answer questions.   Admission team communicated with and agreed with need for admission. Patient admitted. Patient ready to move at this time.     Emergency Department Medication Summary:   Medications  prochlorperazine  (COMPAZINE ) injection 5 mg (5 mg Intravenous Given 08/10/24 0527)  sodium chloride  flush (NS) 0.9 % injection 3 mL (has no administration in time range)  acetaminophen  (TYLENOL ) tablet 650 mg (has no administration in time range)    Or  acetaminophen  (TYLENOL ) suppository 650 mg (has no administration in time range)  insulin  aspart (novoLOG ) injection 0-6 Units (has no administration in time range)  HYDROmorphone  (DILAUDID ) injection 0.5 mg (has no administration in time range)  ondansetron  (ZOFRAN ) injection 4 mg (4 mg Intravenous Given 08/09/24 2346)  lactated ringers  bolus 500 mL (0 mLs Intravenous Stopped 08/10/24 0058)  fentaNYL  (SUBLIMAZE ) injection 50 mcg (50 mcg Intravenous Given 08/10/24 0101)  iohexol  (OMNIPAQUE ) 350 MG/ML injection 75 mL (75 mLs Intravenous Contrast Given 08/10/24 0100)  metroNIDAZOLE  (FLAGYL ) IVPB 500 mg (0 mg Intravenous Stopped 08/10/24 0453)           Clinical Impression:  1. Colitis      Admit   Final Clinical Impression(s) / ED Diagnoses Final diagnoses:  Colitis    Rx / DC Orders ED Discharge Orders     None         [name], MD 08/10/24 [phone]

## 2024-08-09 NOTE — ED Provider Notes (Incomplete)
  [place] EMERGENCY DEPARTMENT AT [facility] Provider Note   CSN: [id number] Arrival date & time: 08/09/24  2241     History Chief Complaint  Patient presents with  . Abdominal Pain    HPI Nancy [name] is a 63 y.o. female presenting for ***.   Patient's recorded medical, surgical, social, medication list and allergies were reviewed in the Snapshot window as part of the initial history.   Review of Systems   Review of Systems  Physical Exam Updated Vital Signs BP (!) 171/110   Pulse 92   Temp 98 F (36.7 C) (Oral)   Resp 18   Wt 108 kg   SpO2 94%   BMI 42.16 kg/m  Physical Exam   ED Course/ Medical Decision Making/ A&P Clinical Course as of 08/09/24 2356  Sun Aug 09, 2024  2319 Glucose-Capillary(!): 145 [CC]    Clinical Course User Index [CC] [name], MD    Procedures Procedures   Medications Ordered in ED Medications - No data to display  Medical Decision Making:   Nancy [name] is a 63 y.o. female who presented to the ED today with *** detailed above.    {crccomplexity:27900} Complete initial physical exam performed, notably the patient  was ***.    Reviewed and confirmed nursing documentation for past medical history, family history, social history.    Initial Assessment:   With the patient's presentation of ***, most likely diagnosis is ***. Other diagnoses were considered including (but not limited to) ***. These are considered less likely due to history of present illness and physical exam findings.   {crccopa:27899}  Initial Plan:  ***  ***Screening labs including CBC and Metabolic panel to evaluate for infectious or metabolic etiology of disease.  ***Urinalysis with reflex culture ordered to evaluate for UTI or relevant urologic/nephrologic pathology.  ***CXR to evaluate for structural/infectious intrathoracic pathology.  {crccardiactesting:32591::EKG to evaluate for cardiac pathology} Objective evaluation  as below reviewed   Initial Study Results:   Laboratory  All laboratory results reviewed without evidence of clinically relevant pathology.   ***Exceptions include: ***   ***EKG EKG was reviewed independently. Rate, rhythm, axis, intervals all examined and without medically relevant abnormality. ST segments without concerns for elevations.    Radiology:  All images reviewed independently. ***Agree with radiology report at this time.   No results found.    Consults: Case discussed with ***.   Reassessment and Plan:   ***    ***  Clinical Impression: No diagnosis found.   Data Unavailable   Final Clinical Impression(s) / ED Diagnoses Final diagnoses:  None    Rx / DC Orders ED Discharge Orders     None

## 2024-08-09 NOTE — ED Notes (Signed)
 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

## 2024-08-09 NOTE — ED Triage Notes (Signed)
 Pt arrives EMS with repots of upper abdominal pain that started today. Pt reports couple episodes of emesis and x2 and x2 episodes of bright red stool. No blood thinner use.

## 2024-08-10 ENCOUNTER — Emergency Department (HOSPITAL_COMMUNITY)

## 2024-08-10 ENCOUNTER — Other Ambulatory Visit (HOSPITAL_COMMUNITY): Payer: Self-pay

## 2024-08-10 ENCOUNTER — Other Ambulatory Visit: Payer: Self-pay

## 2024-08-10 DIAGNOSIS — Z992 Dependence on renal dialysis: Secondary | ICD-10-CM | POA: Diagnosis not present

## 2024-08-10 DIAGNOSIS — K625 Hemorrhage of anus and rectum: Secondary | ICD-10-CM | POA: Diagnosis not present

## 2024-08-10 DIAGNOSIS — E785 Hyperlipidemia, unspecified: Secondary | ICD-10-CM | POA: Diagnosis present

## 2024-08-10 DIAGNOSIS — Z794 Long term (current) use of insulin: Secondary | ICD-10-CM | POA: Diagnosis not present

## 2024-08-10 DIAGNOSIS — K529 Noninfective gastroenteritis and colitis, unspecified: Secondary | ICD-10-CM | POA: Diagnosis not present

## 2024-08-10 DIAGNOSIS — E1143 Type 2 diabetes mellitus with diabetic autonomic (poly)neuropathy: Secondary | ICD-10-CM | POA: Diagnosis present

## 2024-08-10 DIAGNOSIS — E66813 Obesity, class 3: Secondary | ICD-10-CM | POA: Diagnosis present

## 2024-08-10 DIAGNOSIS — N186 End stage renal disease: Secondary | ICD-10-CM | POA: Diagnosis present

## 2024-08-10 DIAGNOSIS — R933 Abnormal findings on diagnostic imaging of other parts of digestive tract: Secondary | ICD-10-CM

## 2024-08-10 DIAGNOSIS — Z833 Family history of diabetes mellitus: Secondary | ICD-10-CM | POA: Diagnosis not present

## 2024-08-10 DIAGNOSIS — E1142 Type 2 diabetes mellitus with diabetic polyneuropathy: Secondary | ICD-10-CM | POA: Diagnosis present

## 2024-08-10 DIAGNOSIS — R1010 Upper abdominal pain, unspecified: Secondary | ICD-10-CM | POA: Diagnosis present

## 2024-08-10 DIAGNOSIS — I2510 Atherosclerotic heart disease of native coronary artery without angina pectoris: Secondary | ICD-10-CM | POA: Diagnosis present

## 2024-08-10 DIAGNOSIS — E039 Hypothyroidism, unspecified: Secondary | ICD-10-CM | POA: Diagnosis present

## 2024-08-10 DIAGNOSIS — Z79890 Hormone replacement therapy (postmenopausal): Secondary | ICD-10-CM | POA: Diagnosis not present

## 2024-08-10 DIAGNOSIS — I509 Heart failure, unspecified: Secondary | ICD-10-CM | POA: Diagnosis present

## 2024-08-10 DIAGNOSIS — Z87891 Personal history of nicotine dependence: Secondary | ICD-10-CM | POA: Diagnosis not present

## 2024-08-10 DIAGNOSIS — Z89511 Acquired absence of right leg below knee: Secondary | ICD-10-CM | POA: Diagnosis not present

## 2024-08-10 DIAGNOSIS — I132 Hypertensive heart and chronic kidney disease with heart failure and with stage 5 chronic kidney disease, or end stage renal disease: Secondary | ICD-10-CM | POA: Diagnosis present

## 2024-08-10 DIAGNOSIS — F32A Depression, unspecified: Secondary | ICD-10-CM | POA: Diagnosis present

## 2024-08-10 DIAGNOSIS — Z8249 Family history of ischemic heart disease and other diseases of the circulatory system: Secondary | ICD-10-CM | POA: Diagnosis not present

## 2024-08-10 DIAGNOSIS — K51511 Left sided colitis with rectal bleeding: Secondary | ICD-10-CM | POA: Diagnosis present

## 2024-08-10 DIAGNOSIS — D631 Anemia in chronic kidney disease: Secondary | ICD-10-CM | POA: Diagnosis present

## 2024-08-10 DIAGNOSIS — Z89512 Acquired absence of left leg below knee: Secondary | ICD-10-CM | POA: Diagnosis not present

## 2024-08-10 DIAGNOSIS — G8929 Other chronic pain: Secondary | ICD-10-CM | POA: Diagnosis present

## 2024-08-10 DIAGNOSIS — E1122 Type 2 diabetes mellitus with diabetic chronic kidney disease: Secondary | ICD-10-CM | POA: Diagnosis present

## 2024-08-10 DIAGNOSIS — E1151 Type 2 diabetes mellitus with diabetic peripheral angiopathy without gangrene: Secondary | ICD-10-CM | POA: Diagnosis present

## 2024-08-10 DIAGNOSIS — Z6841 Body Mass Index (BMI) 40.0 and over, adult: Secondary | ICD-10-CM | POA: Diagnosis not present

## 2024-08-10 LAB — CBC
HCT: 40.2 % (ref 36.0–46.0)
Hemoglobin: 12.7 g/dL (ref 12.0–15.0)
MCH: 29.7 pg (ref 26.0–34.0)
MCHC: 31.6 g/dL (ref 30.0–36.0)
MCV: 94.1 fL (ref 80.0–100.0)
Platelets: 212 K/uL (ref 150–400)
RBC: 4.27 MIL/uL (ref 3.87–5.11)
RDW: 14.4 % (ref 11.5–15.5)
WBC: 14.2 K/uL — ABNORMAL HIGH (ref 4.0–10.5)
nRBC: 0 % (ref 0.0–0.2)

## 2024-08-10 LAB — CBC WITH DIFFERENTIAL/PLATELET
Abs Immature Granulocytes: 0.05 K/uL (ref 0.00–0.07)
Basophils Absolute: 0.1 K/uL (ref 0.0–0.1)
Basophils Relative: 1 %
Eosinophils Absolute: 0.3 K/uL (ref 0.0–0.5)
Eosinophils Relative: 2 %
HCT: 41.8 % (ref 36.0–46.0)
Hemoglobin: 13.3 g/dL (ref 12.0–15.0)
Immature Granulocytes: 0 %
Lymphocytes Relative: 12 %
Lymphs Abs: 1.6 K/uL (ref 0.7–4.0)
MCH: 30 pg (ref 26.0–34.0)
MCHC: 31.8 g/dL (ref 30.0–36.0)
MCV: 94.4 fL (ref 80.0–100.0)
Monocytes Absolute: 1 K/uL (ref 0.1–1.0)
Monocytes Relative: 7 %
Neutro Abs: 11 K/uL — ABNORMAL HIGH (ref 1.7–7.7)
Neutrophils Relative %: 78 %
Platelets: 215 K/uL (ref 150–400)
RBC: 4.43 MIL/uL (ref 3.87–5.11)
RDW: 14 % (ref 11.5–15.5)
WBC: 14 K/uL — ABNORMAL HIGH (ref 4.0–10.5)
nRBC: 0 % (ref 0.0–0.2)

## 2024-08-10 LAB — BASIC METABOLIC PANEL WITH GFR
Anion gap: 12 (ref 5–15)
BUN: 41 mg/dL — ABNORMAL HIGH (ref 8–23)
CO2: 23 mmol/L (ref 22–32)
Calcium: 9 mg/dL (ref 8.9–10.3)
Chloride: 97 mmol/L — ABNORMAL LOW (ref 98–111)
Creatinine, Ser: 7.7 mg/dL — ABNORMAL HIGH (ref 0.44–1.00)
GFR, Estimated: 5 mL/min — ABNORMAL LOW (ref >60)
Glucose, Bld: 180 mg/dL — ABNORMAL HIGH (ref 70–99)
Potassium: 5.5 mmol/L — ABNORMAL HIGH (ref 3.5–5.1)
Sodium: 132 mmol/L — ABNORMAL LOW (ref 135–145)

## 2024-08-10 LAB — CBG MONITORING, ED
Glucose-Capillary: 175 mg/dL — ABNORMAL HIGH (ref 70–99)
Glucose-Capillary: 147 mg/dL — ABNORMAL HIGH (ref 70–99)

## 2024-08-10 LAB — PHOSPHORUS: Phosphorus: 6.2 mg/dL — ABNORMAL HIGH (ref 2.5–4.6)

## 2024-08-10 LAB — HEMOGLOBIN A1C
Hgb A1c MFr Bld: 8 % — ABNORMAL HIGH (ref 4.8–5.6)
Mean Plasma Glucose: 182.9 mg/dL

## 2024-08-10 LAB — HEMOGLOBIN AND HEMATOCRIT, BLOOD
HCT: 40.9 % (ref 36.0–46.0)
Hemoglobin: 12.9 g/dL (ref 12.0–15.0)

## 2024-08-10 LAB — HEPATITIS B SURFACE ANTIGEN: Hepatitis B Surface Ag: NONREACTIVE

## 2024-08-10 LAB — HIV ANTIBODY (ROUTINE TESTING W REFLEX): HIV Screen 4th Generation wRfx: NONREACTIVE

## 2024-08-10 LAB — GLUCOSE, CAPILLARY
Glucose-Capillary: 185 mg/dL — ABNORMAL HIGH (ref 70–99)
Glucose-Capillary: 194 mg/dL — ABNORMAL HIGH (ref 70–99)

## 2024-08-10 MED ORDER — ALPRAZOLAM 0.25 MG PO TABS
0.2500 mg | ORAL_TABLET | Freq: Every day | ORAL | Status: DC | PRN
  Administered 2024-08-10 – 2024-08-11 (×2): 0.25 mg via ORAL
  Filled 2024-08-10 (×2): qty 1

## 2024-08-10 MED ORDER — LEVOTHYROXINE SODIUM 25 MCG PO TABS
25.0000 ug | ORAL_TABLET | Freq: Every day | ORAL | Status: DC
  Administered 2024-08-11 – 2024-08-14 (×4): 25 ug via ORAL
  Filled 2024-08-10 (×4): qty 1

## 2024-08-10 MED ORDER — HEPARIN SODIUM (PORCINE) 1000 UNIT/ML DIALYSIS
4000.0000 [IU] | Freq: Once | INTRAMUSCULAR | Status: AC
  Administered 2024-08-10: 4000 [IU] via INTRAVENOUS_CENTRAL

## 2024-08-10 MED ORDER — HEPARIN SODIUM (PORCINE) 1000 UNIT/ML DIALYSIS
2000.0000 [IU] | INTRAMUSCULAR | Status: AC | PRN
  Administered 2024-08-10: 2000 [IU] via INTRAVENOUS_CENTRAL

## 2024-08-10 MED ORDER — CINACALCET HCL 30 MG PO TABS
30.0000 mg | ORAL_TABLET | ORAL | Status: DC
  Administered 2024-08-12: 30 mg via ORAL
  Filled 2024-08-10: qty 1

## 2024-08-10 MED ORDER — PROCHLORPERAZINE EDISYLATE 10 MG/2ML IJ SOLN
5.0000 mg | Freq: Four times a day (QID) | INTRAMUSCULAR | Status: DC | PRN
  Administered 2024-08-10 – 2024-08-14 (×10): 5 mg via INTRAVENOUS
  Filled 2024-08-10 (×5): qty 2
  Filled 2024-08-10: qty 1
  Filled 2024-08-10 (×4): qty 2

## 2024-08-10 MED ORDER — CIPROFLOXACIN IN D5W 400 MG/200ML IV SOLN
400.0000 mg | INTRAVENOUS | Status: DC
  Filled 2024-08-10: qty 200

## 2024-08-10 MED ORDER — HEPARIN SODIUM (PORCINE) 1000 UNIT/ML IJ SOLN
INTRAMUSCULAR | Status: AC
  Filled 2024-08-10: qty 1

## 2024-08-10 MED ORDER — ACETAMINOPHEN 325 MG PO TABS
650.0000 mg | ORAL_TABLET | Freq: Four times a day (QID) | ORAL | Status: DC | PRN
  Administered 2024-08-13 – 2024-08-14 (×2): 650 mg via ORAL
  Filled 2024-08-10 (×2): qty 2

## 2024-08-10 MED ORDER — FENTANYL CITRATE PF 50 MCG/ML IJ SOSY: 25.0000 ug | PREFILLED_SYRINGE | INTRAMUSCULAR | Status: DC | PRN

## 2024-08-10 MED ORDER — AMLODIPINE BESYLATE 10 MG PO TABS
10.0000 mg | ORAL_TABLET | Freq: Every day | ORAL | Status: DC
Start: 2024-08-10 — End: 2024-08-14
  Administered 2024-08-10 – 2024-08-14 (×5): 10 mg via ORAL
  Filled 2024-08-10 (×5): qty 1

## 2024-08-10 MED ORDER — HEPARIN SODIUM (PORCINE) 1000 UNIT/ML IJ SOLN
INTRAMUSCULAR | Status: AC
Start: 2024-08-10 — End: 2024-08-10
  Filled 2024-08-10: qty 4

## 2024-08-10 MED ORDER — INSULIN ASPART 100 UNIT/ML IJ SOLN
0.0000 [IU] | INTRAMUSCULAR | Status: DC
  Administered 2024-08-10 – 2024-08-11 (×4): 1 [IU] via SUBCUTANEOUS
  Administered 2024-08-11: 3 [IU] via SUBCUTANEOUS
  Administered 2024-08-11 – 2024-08-12 (×2): 2 [IU] via SUBCUTANEOUS
  Administered 2024-08-12 (×2): 1 [IU] via SUBCUTANEOUS
  Administered 2024-08-12 – 2024-08-13 (×3): 2 [IU] via SUBCUTANEOUS
  Administered 2024-08-13: 1 [IU] via SUBCUTANEOUS
  Administered 2024-08-13: 2 [IU] via SUBCUTANEOUS
  Administered 2024-08-13 – 2024-08-14 (×4): 1 [IU] via SUBCUTANEOUS

## 2024-08-10 MED ORDER — VANCOMYCIN HCL 125 MG PO CAPS: 125.0000 mg | ORAL_CAPSULE | Freq: Four times a day (QID) | ORAL | Status: DC

## 2024-08-10 MED ORDER — SODIUM CHLORIDE 0.9% FLUSH
3.0000 mL | Freq: Two times a day (BID) | INTRAVENOUS | Status: DC
  Administered 2024-08-10 – 2024-08-13 (×8): 3 mL via INTRAVENOUS

## 2024-08-10 MED ORDER — ACETAMINOPHEN 650 MG RE SUPP: 650.0000 mg | Freq: Four times a day (QID) | RECTAL | Status: DC | PRN

## 2024-08-10 MED ORDER — HEPARIN SODIUM (PORCINE) 1000 UNIT/ML DIALYSIS
1000.0000 [IU] | INTRAMUSCULAR | Status: DC | PRN
  Administered 2024-08-10: 3800 [IU]

## 2024-08-10 MED ORDER — CALCITRIOL 0.25 MCG PO CAPS
2.0000 ug | ORAL_CAPSULE | ORAL | Status: DC
  Administered 2024-08-10 – 2024-08-14 (×3): 2 ug via ORAL
  Filled 2024-08-10: qty 8
  Filled 2024-08-10: qty 4
  Filled 2024-08-10: qty 8

## 2024-08-10 MED ORDER — IOHEXOL 350 MG/ML SOLN
75.0000 mL | Freq: Once | INTRAVENOUS | Status: AC | PRN
  Administered 2024-08-10: 75 mL via INTRAVENOUS

## 2024-08-10 MED ORDER — HYDROMORPHONE HCL 1 MG/ML IJ SOLN
INTRAMUSCULAR | Status: AC
  Filled 2024-08-10: qty 0.5

## 2024-08-10 MED ORDER — ISOSORBIDE MONONITRATE ER 60 MG PO TB24
60.0000 mg | ORAL_TABLET | Freq: Every day | ORAL | Status: DC
  Administered 2024-08-11 – 2024-08-13 (×3): 60 mg via ORAL
  Filled 2024-08-10 (×3): qty 1

## 2024-08-10 MED ORDER — INSULIN GLARGINE 100 UNITS/ML SOLOSTAR PEN: 2.0000 [IU] | PEN_INJECTOR | Freq: Two times a day (BID) | SUBCUTANEOUS | Status: DC

## 2024-08-10 MED ORDER — METRONIDAZOLE 500 MG/100ML IV SOLN
500.0000 mg | Freq: Once | INTRAVENOUS | Status: AC
  Administered 2024-08-10: 500 mg via INTRAVENOUS
  Filled 2024-08-10: qty 100

## 2024-08-10 MED ORDER — HYDROMORPHONE HCL 1 MG/ML IJ SOLN
0.5000 mg | INTRAMUSCULAR | Status: DC | PRN
  Administered 2024-08-10 – 2024-08-14 (×12): 0.5 mg via INTRAVENOUS
  Filled 2024-08-10 (×2): qty 0.5
  Filled 2024-08-10: qty 1
  Filled 2024-08-10 (×8): qty 0.5

## 2024-08-10 MED ORDER — METRONIDAZOLE 500 MG/100ML IV SOLN
500.0000 mg | Freq: Two times a day (BID) | INTRAVENOUS | Status: DC
  Administered 2024-08-11: 500 mg via INTRAVENOUS
  Filled 2024-08-10: qty 100

## 2024-08-10 MED ORDER — LABETALOL HCL 200 MG PO TABS
300.0000 mg | ORAL_TABLET | Freq: Every day | ORAL | Status: DC
  Administered 2024-08-10 – 2024-08-13 (×4): 300 mg via ORAL
  Filled 2024-08-10 (×5): qty 2

## 2024-08-10 MED ORDER — INSULIN GLARGINE 100 UNIT/ML ~~LOC~~ SOLN
2.0000 [IU] | Freq: Two times a day (BID) | SUBCUTANEOUS | Status: DC
  Administered 2024-08-10 – 2024-08-13 (×7): 2 [IU] via SUBCUTANEOUS
  Filled 2024-08-10 (×9): qty 0.02

## 2024-08-10 MED ORDER — FENTANYL CITRATE PF 50 MCG/ML IJ SOSY
50.0000 ug | PREFILLED_SYRINGE | Freq: Once | INTRAMUSCULAR | Status: AC
  Administered 2024-08-10: 50 ug via INTRAVENOUS
  Filled 2024-08-10: qty 1

## 2024-08-10 MED ORDER — CHLORHEXIDINE GLUCONATE CLOTH 2 % EX PADS
6.0000 | MEDICATED_PAD | Freq: Every day | CUTANEOUS | Status: DC
  Administered 2024-08-11 – 2024-08-13 (×3): 6 via TOPICAL

## 2024-08-10 NOTE — Consult Note (Addendum)
 Consultation  Referring Provider: [facility]/ Alto Primary Care Physician:  [name], MD Primary Gastroenterologist:  Dr.Pyrtle  Reason for Consultation: Acute abdominal pain nausea vomiting diarrhea and hematochezia  HPI: Nancy [name] is a 63 y.o. female with history of end-stage renal disease on dialysis, who last had dialysis on Friday, insulin -dependent diabetes mellitus, history of prior CVA/wheelchair-bound, peripheral vascular disease status post BKA, hypertension, and previous history of gallstone pancreatitis in 2021. Patient has history of chronic constipation, and had been seen in our office in May 2025 with primary complaint of constipation.  She was started on MiraLAX  on a daily basis with plan to consider prescription therapy i.e. Linzess if she failed MiraLAX .  She also has a history of chronic nausea which has been present for some time as well as early satiety.  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  Also with history of prolonged QT. She did have EGD in 2021 during an admission with nausea and vomiting and that was a normal exam. She has had prior colonoscopy per notes about 10 years ago through [facility] for screening and had reported that was negative but we do not have a copy of that report.  Patient says that she did well with dialysis on Friday and felt okay on Saturday.  Woke up yesterday morning with abdominal pain which she says she actually felt more on the right side, but describes crampy pains, nausea vomiting dry heaves with frequent retching, and diarrhea.  She says she initially had several episodes of diarrhea and then started noticing bright red blood with the diarrhea and some episodes with just small amounts of blood.  She felt diaphoretic but is not sure that she had fever or chills. No family members ill, had not eaten anything she thought may have been bad.  No recent new medications, no recent antibiotics.  Then into the ER last evening as  she was unable to control her symptoms at home.  Initial labs show WBC of 14,000/hemoglobin 13/hematocrit 42.8 Sodium 133/potassium 5.3/BUN 39/creatinine 7.38 LFTs normal  CT of the abdomen and pelvis was done with contrast-stomach within normal limits, no evidence of small bowel wall thickening or dilation few scattered diverticuli in the small and large bowel, there is descending colonic bowel wall thickening and pericolonic fat stranding.  Labs today-CBC and be met pending GI path panel and C. difficile quick screening ordered and pending.    Past Medical History:  Diagnosis Date   Anemia 2006   Anginal pain (HCC)    Carotid artery disease (HCC)    right ICA occlusion, 40-59% LICA 02/2021 US    CHF (congestive heart failure) (HCC)    Coronary artery disease    Depression 2014   previously on amitryptiline    Diabetes mellitus with neurological manifestation (HCC) 2006   Diabetic peripheral neuropathy (HCC) 04/26/2020   Dysphagia    Dyspnea    ESRD on hemodialysis (HCC)    Fracture of left ankle 1997   Gastroparesis 07/2016   Heart murmur    HOH (hard of hearing) 2004   Hyperlipidemia 2006   Hypertension 2006   IBS (irritable bowel syndrome) 2002   Leukopenia 2015   Macular degeneration 11/2019   Peripheral vascular disease (HCC)    Shingles 2009   Stroke [facility]) 2020   Thyroid  nodule 2004    Past Surgical History:  Procedure Laterality Date   A/V FISTULAGRAM Right 09/25/2022   Procedure: A/V Fistulagram;  Surgeon: Serene Gaile ORN, MD;  Location: [facility] INVASIVE CV LAB;  Service: Cardiovascular;  Laterality: Right;   ABDOMINAL HYSTERECTOMY  2005   AMPUTATION Bilateral 04/04/2022   Procedure: BILATERAL BELOW KNEE AMPUTATION;  Surgeon: Harden Jerona GAILS, MD;  Location: [facility] OR;  Service: Orthopedics;  Laterality: Bilateral;   AV FISTULA PLACEMENT Right 12/22/2021   Procedure: RIGHT ARM ARTERIOVENOUS (AV) BRACIOCPHELAIC FISTULA CREATION;  Surgeon: Sheree Penne Bruckner, MD;   Location: [facility] OR;  Service: Vascular;  Laterality: Right;   AV FISTULA PLACEMENT Right 02/19/2022   Procedure: RIGHT ARTERIOVENOUS FISTULA;  Surgeon: Eliza Bruckner RAMAN, MD;  Location: [facility] OR;  Service: Vascular;  Laterality: Right;   AV FISTULA PLACEMENT Left 12/24/2023   Procedure: LEFT BRACHIOCEPHALIC ARTERIOVENOUS (AV) FISTULA CREATION;  Surgeon: Lanis Fonda BRAVO, MD;  Location: [facility] OR;  Service: Vascular;  Laterality: Left;   BASCILIC VEIN TRANSPOSITION Right 08/07/2022   Procedure: SECOND STAGE RIGHT BASILIC VEIN TRANSPOSITION;  Surgeon: Eliza Bruckner RAMAN, MD;  Location: [facility] OR;  Service: Vascular;  Laterality: Right;   CATARACT EXTRACTION Left 11/2019   CESAREAN SECTION  1983   CHOLECYSTECTOMY N/A 03/05/2020   Procedure: LAPAROSCOPIC CHOLECYSTECTOMY WITH INTRAOPERATIVE CHOLANGIOGRAM;  Surgeon: Belinda Cough, MD;  Location: [facility] ORS;  Service: General;  Laterality: N/A;   COLONOSCOPY     [facility] said no polyps were found   ESOPHAGOGASTRODUODENOSCOPY (EGD) WITH PROPOFOL  Left 08/26/2014   Procedure: ESOPHAGOGASTRODUODENOSCOPY (EGD) WITH PROPOFOL ;  Surgeon: Elsie Cree, MD;  Location: [facility]COPY;  Service: Endoscopy;  Laterality: Left;   ESOPHAGOGASTRODUODENOSCOPY (EGD) WITH PROPOFOL  N/A 03/03/2020   Procedure: ESOPHAGOGASTRODUODENOSCOPY (EGD) WITH PROPOFOL ;  Surgeon: Albertus Gordy HERO, MD;  Location: [facility]OSCOPY;  Service: Gastroenterology;  Laterality: N/A;   IR FLUORO GUIDE CV LINE RIGHT  02/15/2022   IR US  GUIDE VASC ACCESS RIGHT  02/15/2022   PERIPHERAL VASCULAR BALLOON ANGIOPLASTY Right 09/25/2022   Procedure: PERIPHERAL VASCULAR BALLOON ANGIOPLASTY;  Surgeon: Serene Gaile ORN, MD;  Location: [facility];  Service: Cardiovascular;  Laterality: Right;  AVF   RIGHT HEART CATH N/A 07/14/2021   Procedure: RIGHT HEART CATH;  Surgeon: Rolan Ezra RAMAN, MD;  Location: [facility] INVASIVE CV LAB;  Service: Cardiovascular;  Laterality: N/A;   RIGHT/LEFT HEART CATH AND CORONARY  ANGIOGRAPHY N/A 04/27/2021   Procedure: RIGHT/LEFT HEART CATH AND CORONARY ANGIOGRAPHY;  Surgeon: Court Dorn PARAS, MD;  Location: [facility]VAS[facility] CV LAB;  Service: Cardiovascular;  Laterality: N/A;    Prior to Admission medications   Medication Sig Start Date End Date Taking? Authorizing Provider  acetaminophen  (TYLENOL ) 500 MG tablet Take 1,000 mg by mouth every 8 (eight) hours as needed for mild pain (pain score 1-3), moderate pain (pain score 4-6) or headache.   Yes [provider]  albuterol  (VENTOLIN  HFA) 108 (90 Base) MCG/ACT inhaler Inhale 2 puffs into the lungs every 6 (six) hours as needed. 08/21/23  Yes [name], MD  ALPRAZolam  (XANAX ) 0.25 MG tablet Take 1 tablet (0.25 mg total) by mouth on Monday, Wednesday and Friday at hemodialysis. 06/10/24  Yes [name], MD  amLODipine  (NORVASC ) 10 MG tablet Take 1 tablet (10 mg total) by mouth daily for hypertension 06/04/24  Yes [name], MD  aspirin  81 MG EC tablet Take 1 tablet (81 mg total) by mouth daily. 09/26/21  Yes Mil[place], Harlene HERO, FNP  atorvastatin  (LIPITOR) 40 MG tablet Take 1 tablet (40 mg total) by mouth daily for cholesterol. 06/04/24  Yes [name], MD  ezetimibe  (ZETIA ) 10 MG tablet Take 1 tablet (  10 mg total) by mouth daily for cholesterol. 06/04/24  Yes [name], MD  insulin  glargine (LANTUS  SOLOSTAR) 100 UNIT/ML Solostar Pen Inject 5 Units into the skin 2 (two) times daily. Increased by Provider on 6/24 06/16/24  Yes [name], MD  isosorbide  mononitrate (IMDUR ) 30 MG 24 hr tablet Take 2 tablets (60 mg total) by mouth daily for blood pressure. 06/04/24  Yes [name], MD  labetalol  (NORMODYNE ) 300 MG tablet Take 1 tablet (300 mg total) by mouth 2 (two) times daily. Patient taking differently: Take 300 mg by mouth daily. Take one tablet by mouth on Tues, Thurs, Saturday, and Sunday in the morning (non-dialysis days). Take one tablet by mouth on Mon, Wed,and Fri in  the evening after dialysis. 06/04/24  Yes [name], MD  levothyroxine  (SYNTHROID ) 25 MCG tablet Take 1 tablet (25 mcg total) by mouth daily for low thyroid  hormone. 06/04/24  Yes [name], MD  Menthol, Topical Analgesic, (BIOFREEZE COOL THE PAIN) 4 % GEL Apply 1 Application topically as needed (Shoulder and knee pain.).   Yes [provider]  omeprazole  (PRILOSEC) 20 MG capsule Take 2 capsules (40 mg total) by mouth daily for acid reflux. 07/13/24  Yes [name], MD  ondansetron  (ZOFRAN ) 4 MG tablet Take 1 tablet (4 mg total) by mouth every 8 (eight) hours as needed for nausea. 04/29/24  Yes   polyethylene glycol powder (GLYCOLAX /MIRALAX ) 17 GM/SCOOP powder Mix 1 capful with 8 ounces of fluid and take by mouth daily as needed. 03/25/24  Yes [name], MD  sevelamer  carbonate (RENVELA ) 800 MG tablet Take 3 tablets (2,400 mg total) by mouth 3 (three) times daily with meals and 1 tablet with snacks. 09/04/23  Yes   Insulin  NPH Isophane & Regular (RELION 70/30 [place]) Inject 35 Units into the skin 2 (two) times daily.  08/17/14  [provider]    Current Facility-Administered Medications  Medication Dose Route Frequency Provider Last Rate Last Admin   acetaminophen  (TYLENOL ) tablet 650 mg  650 mg Oral Q6H PRN [facility] Isaiah CROME, NP       Or   acetaminophen  (TYLENOL ) suppository 650 mg  650 mg Rectal Q6H PRN [place] Isaiah CROME, NP       calcitRIOL  (ROCALTROL ) capsule 2 mcg  2 mcg Oral Q M,W,F Melia Lynwood ORN, MD       Chlorhexidine  Gluconate Cloth 2 % PADS 6 each  6 each Topical Q0600 Geralynn Charleston, MD       cinacalcet  (SENSIPAR ) tablet 30 mg  30 mg Oral Once per day on Monday Wednesday Friday Melia Lynwood ORN, MD       HYDROmorphone  (DILAUDID ) injection 0.5 mg  0.5 mg Intravenous Q3H PRN Alto Isaiah CROME, NP   0.5 mg at 08/10/24 1002   insulin  aspart (novoLOG ) injection 0-6 Units  0-6 Units Subcutaneous Q4H Alto Isaiah CROME, NP   1 Units at 08/10/24 0749    prochlorperazine  (COMPAZINE ) injection 5 mg  5 mg Intravenous Q6H PRN Opyd, Timothy S, MD   5 mg at 08/10/24 0527   sodium chloride  flush (NS) 0.9 % injection 3 mL  3 mL Intravenous Q12H Alto Isaiah CROME, NP   3 mL at 08/10/24 1004   Current Outpatient Medications  Medication Sig Dispense Refill   acetaminophen  (TYLENOL ) 500 MG tablet Take 1,000 mg by mouth every 8 (eight) hours as needed for mild pain (pain score 1-3), moderate pain (pain score 4-6) or headache.  albuterol  (VENTOLIN  HFA) 108 (90 Base) MCG/ACT inhaler Inhale 2 puffs into the lungs every 6 (six) hours as needed. 18 g 3   ALPRAZolam  (XANAX ) 0.25 MG tablet Take 1 tablet (0.25 mg total) by mouth on Monday, Wednesday and Friday at hemodialysis. 12 tablet 2   amLODipine  (NORVASC ) 10 MG tablet Take 1 tablet (10 mg total) by mouth daily for hypertension 90 tablet 1   aspirin  81 MG EC tablet Take 1 tablet (81 mg total) by mouth daily. 60 tablet 1   atorvastatin  (LIPITOR) 40 MG tablet Take 1 tablet (40 mg total) by mouth daily for cholesterol. 90 tablet 1   ezetimibe  (ZETIA ) 10 MG tablet Take 1 tablet (10 mg total) by mouth daily for cholesterol. 90 tablet 1   insulin  glargine (LANTUS  SOLOSTAR) 100 UNIT/ML Solostar Pen Inject 5 Units into the skin 2 (two) times daily. Increased by Provider on 6/24 15 mL 2   isosorbide  mononitrate (IMDUR ) 30 MG 24 hr tablet Take 2 tablets (60 mg total) by mouth daily for blood pressure. 180 tablet 1   labetalol  (NORMODYNE ) 300 MG tablet Take 1 tablet (300 mg total) by mouth 2 (two) times daily. (Patient taking differently: Take 300 mg by mouth daily. Take one tablet by mouth on Tues, Thurs, Saturday, and Sunday in the morning (non-dialysis days). Take one tablet by mouth on Mon, Wed,and Fri in the evening after dialysis.) 180 tablet 1   levothyroxine  (SYNTHROID ) 25 MCG tablet Take 1 tablet (25 mcg total) by mouth daily for low thyroid  hormone. 90 tablet 1   Menthol, Topical Analgesic, (BIOFREEZE COOL THE  PAIN) 4 % GEL Apply 1 Application topically as needed (Shoulder and knee pain.).     omeprazole  (PRILOSEC) 20 MG capsule Take 2 capsules (40 mg total) by mouth daily for acid reflux. 180 capsule 0   ondansetron  (ZOFRAN ) 4 MG tablet Take 1 tablet (4 mg total) by mouth every 8 (eight) hours as needed for nausea. 90 tablet 3   polyethylene glycol powder (GLYCOLAX /MIRALAX ) 17 GM/SCOOP powder Mix 1 capful with 8 ounces of fluid and take by mouth daily as needed. 3350 g 1   sevelamer  carbonate (RENVELA ) 800 MG tablet Take 3 tablets (2,400 mg total) by mouth 3 (three) times daily with meals and 1 tablet with snacks. 1080 tablet 3    Allergies as of 08/09/2024 - Review Complete 08/09/2024  Allergen Reaction Noted   Hydralazine  Other (See Comments) 10/22/2022   Hydralazine  hcl Other (See Comments) 11/08/2019   Hydrocodone Itching and Other (See Comments) 11/17/2013   Metformin  and related Nausea And Vomiting and Other (See Comments) 09/21/2014   Other Nausea Only and Other (See Comments) 05/09/2021   Plaquenil [hydroxychloroquine sulfate] Hives 10/07/2018   Shellfish allergy Nausea And Vomiting 10/22/2022   Shellfish-derived products Nausea Only and Other (See Comments) 05/09/2021   Shrimp (diagnostic) Nausea Only and Other (See Comments) 04/24/2021   Sulfa antibiotics Hives 05/09/2021   Sulfur  Hives 11/17/2013    Family History  Problem Relation Age of Onset   Hypertension Mother    Heart disease Mother    Diabetes Mother    Thyroid  disease Mother    Congestive Heart Failure Mother    Breast cancer Maternal Grandmother    Colon cancer Maternal Grandfather    Heart attack Sister    Heart disease Brother    Hyperlipidemia Brother    Hypertension Brother    Diabetes Father    Breast cancer Maternal Aunt     Social History  Socioeconomic History   Marital status: Legally Separated    Spouse name: Lamar   Number of children: 1   Years of education: some colle   Highest education  level: Not on file  Occupational History   Occupation: Employed FT as Warden/ranger    Comment: [employer] , NA  Tobacco Use   Smoking status: Former    Current packs/day: 0.25    Average packs/day: 0.3 packs/day for 0.5 years (0.1 ttl pk-yrs)    Types: Cigarettes    Passive exposure: Never   Smokeless tobacco: Never   Tobacco comments:    Former smoker 11/03/21  Vaping Use   Vaping status: Never Used  Substance and Sexual Activity   Alcohol use: No   Drug use: No   Sexual activity: Not Currently    Birth control/protection: None  Other Topics Concern   Not on file  Social History Narrative   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    Social Drivers of Health   Financial Resource Strain: Low Risk  (02/04/2024)   Overall Financial Resource Strain (CARDIA)    Difficulty of Paying Living Expenses: Not hard at all  Food Insecurity: No Food Insecurity (02/04/2024)   Hunger Vital Sign    Worried About Running Out of Food in the Last Year: Never true    Ran Out of Food in the Last Year: Never true  Transportation Needs: No Transportation Needs (02/04/2024)   PRAPARE - Administrator, Civil Service (Medical): No    Lack of Transportation (Non-Medical): No  Physical Activity: Inactive (02/04/2024)   Exercise Vital Sign    Days of Exercise per Week: 0 days    Minutes of Exercise per Session: 0 min  Stress: No Stress Concern Present (02/04/2024)   [employer] of Occupational Health - Occupational Stress Questionnaire    Feeling of Stress : Not at all  Social Connections: Moderately Integrated (02/04/2024)   Social Connection and Isolation Panel    Frequency of Communication with Friends and Family: Twice a week    Frequency of Social Gatherings with Friends and Family: Once a week    Attends Religious Services: Never    Database administrator or Organizations: Yes    Attends Banker Meetings: Never    Marital Status:  Married  Catering manager Violence: Not At Risk (02/04/2024)   Humiliation, Afraid, Rape, and Kick questionnaire    Fear of Current or Ex-Partner: No    Emotionally Abused: No    Physically Abused: No    Sexually Abused: No    Review of Systems: Pertinent positive and negative review of systems were noted in the above HPI section.  All other review of systems was otherwise negative.   Physical Exam: Vital signs in last 24 hours: Temp:  [97.8 F (36.6 C)-98.1 F (36.7 C)] 98.1 F (36.7 C) (08/25 0657) Pulse Rate:  [85-92] 87 (08/25 0922) Resp:  [14-22] 22 (08/25 0922) BP: (130-171)/(81-110) 157/81 (08/25 0922) SpO2:  [94 %-100 %] 100 % (08/25 0922) Weight:  [891 kg] 108 kg (08/24 2246)   General:   Alert,  Well-developed, well-nourished, acute and chronically ill-appearing older African-American female ,pleasant and cooperative in NAD-having intermittent dry heaves during our interview Head:  Normocephalic and atraumatic. Eyes:  Sclera clear, no icterus.   Conjunctiva pink. Ears:  Normal auditory acuity. Nose:  No deformity, discharge,  or lesions. Mouth:  No deformity or lesions.   Neck:  Supple; no masses or thyromegaly. Lungs:  Clear throughout to auscultation.   No wheezes, crackles, or rhonchi.  Heart:  Regular rate and rhythm; no murmurs, clicks, rubs,  or gallops. Abdomen:  Soft, obese, there is no focal tenderness, no guarding or rebound, bowel sounds are present,nonpalp mass or hsm.   Rectal: Not done Msk:  Symmetrical without gross deformities. . Pulses:  Normal pulses noted. Extremities: Status post BKA bilateral Neurologic:  Alert and  oriented x4;  grossly normal neurologically. Skin:  Intact without significant lesions or rashes.. Psych:  Alert and cooperative. Normal mood and affect.  Intake/Output from previous day: 08/24 0701 - 08/25 0700 In: 1098.3 [IV Piggyback:1098.3] Out: -  Intake/Output this shift: No intake/output data recorded.  Lab  Results: Recent Labs    08/09/24 2250  WBC 14.0*  HGB 13.3  HCT 41.8  PLT 215   BMET Recent Labs    08/09/24 2250 08/10/24 0651  NA 133* 132*  K 5.3* 5.5*  CL 97* 97*  CO2 23 23  GLUCOSE 172* 180*  BUN 39* 41*  CREATININE 7.38* 7.70*  CALCIUM  9.2 9.0   LFT Recent Labs    08/09/24 2250  PROT 7.5  ALBUMIN  3.6  AST 17  ALT 18  ALKPHOS 93  BILITOT 0.7   PT/INR No results for input(s): LABPROT, INR in the last 72 hours. Hepatitis Panel Recent Labs    08/10/24 0650  HEPBSAG NON REACTIVE     IMPRESSION:  #14 63 year old female with end-stage renal disease on dialysis, presenting with acute onset yesterday of abdominal pain, initially more right-sided, then generalized, diarrhea, nausea vomiting, dry heaves and eventual hematochezia associated with the diarrhea.  Labs showed leukocytosis and normal hemoglobin. CT abdomen and pelvis done with contrast shows a descending colitis  This may be an infectious colitis versus left-sided segmental ischemic colitis  #2 prior history of CVA #3 peripheral vascular disease status post bilateral BKA's #4 insulin -dependent diabetes mellitus #5 chronic constipation #6 chronic nausea with intermittent vomiting suspected to be diabetic gastroparesis-has not been on any medication has history of prolonged QT  PLAN: Supportive management Clear liquid diet, advance as tolerates IV Zofran  4 mg IV every 6 hours as needed for nausea and vomiting Agree with GI path panel and stool for C. Difficile Repeat CBC and be met in a.m. tomorrow Would not anticipate colonoscopy during this admission as this is an acute colitis either infectious or secondary to segmental ischemia.  GI will follow with you Once she is over this acute episode she may be a good candidate for Motegrity which would help with gastroparesis and her chronic constipation.  Motegrity not available on hospital formulary and not appropriate during this acute  episode.    Amy EsterwoodPA-C  08/10/2024, 10:33 AM     ATTENDING ADDENDUM: 63 y/o female ESRD on HD, admitted with abdominal pain, nausea / vomiting, diarrhea, rectal bleeding. CT scan shows left sided colitis, and labs show leukocytosis without any anemia.   Seen and examined, discussed ddx with her. Ischemic colitis vs. Infectious colitis are most likely given acute onset of symptoms and CT findings. Recommend infectious stool studies - C Diff and GI pathogen panel, to rule out infection acutely and can give empiric antibiotics while this is pending. If infectious workup negative, then assume she may have had ischemic colitis. Will need colonoscopy at some point to evaluate her symptoms and CT scan findings, question is timing. If she resolves her symptoms and improves with empiric antibiotics,  can do as outpatient. If bleeding / symptoms persist, will do as inpatient. Will follow, call with questions.  Marcey Naval, MD [facility]

## 2024-08-10 NOTE — H&P (Addendum)
 History and Physical    Patient: Nancy [name] FMW:[id number] DOB: 1961/12/10 DOA: 08/09/2024 DOS: the patient was seen and examined on 08/10/2024 PCP: [name], MD  Patient coming from: Home  Chief Complaint:  Chief Complaint  Patient presents with   Abdominal Pain   HPI: Nancy [name] is a 63 y.o. 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  GI history significant for chronic constipation and recurrent nausea.  Presented to the ED with upper abdominal pain that started on Sunday.  Has had several episodes of emesis and 2 definitive episodes of bright red blood per rectum prior to arrival.  Since arrival to the ED patient reports every time she has dry heaves she passes bright red blood per rectum.  There is a smear of bright red blood noted on the bedsheet.  In the ED she was afebrile with initial blood pressure readings hypertensive at 171/110 but later decreased to 96 after receiving pain medication.  She was given a 500 cc lactated ringer  bolus.  Initial hemoglobin was 13.3.  Last dialysis Friday and current creatinine 7.38 with potassium of 5.3.  BC 15,000.  CT scan demonstrated ascending colitis.  Hospitalist service was consulted to evaluate the patient for admission.  Review of Systems: As mentioned in the history of present illness. All other systems reviewed and are negative. Past Medical History:  Diagnosis Date   Anemia 2006   Anginal pain (HCC)    Carotid artery disease (HCC)    right ICA occlusion, 40-59% LICA 02/2021 US    CHF (congestive heart failure) (HCC)    Coronary artery disease    Depression 2014   previously on amitryptiline    Diabetes mellitus with neurological manifestation (HCC) 2006   Diabetic peripheral neuropathy (HCC) 04/26/2020   Dysphagia    Dyspnea    ESRD on hemodialysis (HCC)    Fracture of left ankle 1997   Gastroparesis  07/2016   Heart murmur    HOH (hard of hearing) 2004   Hyperlipidemia 2006   Hypertension 2006   IBS (irritable bowel syndrome) 2002   Leukopenia 2015   Macular degeneration 11/2019   Peripheral vascular disease (HCC)    Shingles 2009   Stroke [facility]) 2020   Thyroid  nodule 2004   Past Surgical History:  Procedure Laterality Date   A/V FISTULAGRAM Right 09/25/2022   Procedure: A/V Fistulagram;  Surgeon: Serene Gaile ORN, MD;  Location: [facility];  Service: Cardiovascular;  Laterality: Right;   ABDOMINAL HYSTERECTOMY  2005   AMPUTATION Bilateral 04/04/2022   Procedure: BILATERAL BELOW KNEE AMPUTATION;  Surgeon: Harden Jerona GAILS, MD;  Location: [facility] OR;  Service: Orthopedics;  Laterality: Bilateral;   AV FISTULA PLACEMENT Right 12/22/2021   Procedure: RIGHT ARM ARTERIOVENOUS (AV) BRACIOCPHELAIC FISTULA CREATION;  Surgeon: Sheree Penne Bruckner, MD;  Location: [facility] OR;  Service: Vascular;  Laterality: Right;   AV FISTULA PLACEMENT Right 02/19/2022   Procedure: RIGHT ARTERIOVENOUS FISTULA;  Surgeon: Eliza Bruckner RAMAN, MD;  Location: [facility] OR;  Service: Vascular;  Laterality: Right;   AV FISTULA PLACEMENT Left 12/24/2023   Procedure: LEFT BRACHIOCEPHALIC ARTERIOVENOUS (AV) FISTULA CREATION;  Surgeon: Lanis Fonda BRAVO, MD;  Location: [facility] OR;  Service: Vascular;  Laterality: Left;   BASCILIC VEIN TRANSPOSITION Right 08/07/2022   Procedure: SECOND STAGE RIGHT BASILIC VEIN TRANSPOSITION;  Surgeon: Eliza Bruckner RAMAN, MD;  Location: [facility] OR;  Service: Vascular;  Laterality:  Right;   CATARACT EXTRACTION Left 11/2019   CESAREAN SECTION  1983   CHOLECYSTECTOMY N/A 03/05/2020   Procedure: LAPAROSCOPIC CHOLECYSTECTOMY WITH INTRAOPERATIVE CHOLANGIOGRAM;  Surgeon: Belinda Cough, MD;  Location: [facility] ORS;  Service: General;  Laterality: N/A;   COLONOSCOPY     [facility] said no polyps were found   ESOPHAGOGASTRODUODENOSCOPY (EGD) WITH PROPOFOL  Left 08/26/2014   Procedure:  ESOPHAGOGASTRODUODENOSCOPY (EGD) WITH PROPOFOL ;  Surgeon: Elsie Cree, MD;  Location: [facility]OSCOPY;  Service: Endoscopy;  Laterality: Left;   ESOPHAGOGASTRODUODENOSCOPY (EGD) WITH PROPOFOL  N/A 03/03/2020   Procedure: ESOPHAGOGASTRODUODENOSCOPY (EGD) WITH PROPOFOL ;  Surgeon: Albertus Gordy HERO, MD;  Location: [facility] ENDOSCOPY;  Service: Gastroenterology;  Laterality: N/A;   IR FLUORO GUIDE CV LINE RIGHT  02/15/2022   IR US  GUIDE VASC ACCESS RIGHT  02/15/2022   PERIPHERAL VASCULAR BALLOON ANGIOPLASTY Right 09/25/2022   Procedure: PERIPHERAL VASCULAR BALLOON ANGIOPLASTY;  Surgeon: Serene Gaile ORN, MD;  Location: [facility];  Service: Cardiovascular;  Laterality: Right;  AVF   RIGHT HEART CATH N/A 07/14/2021   Procedure: RIGHT HEART CATH;  Surgeon: Rolan Ezra RAMAN, MD;  Location: [facility] INVASIVE CV LAB;  Service: Cardiovascular;  Laterality: N/A;   RIGHT/LEFT HEART CATH AND CORONARY ANGIOGRAPHY N/A 04/27/2021   Procedure: RIGHT/LEFT HEART CATH AND CORONARY ANGIOGRAPHY;  Surgeon: Court Dorn PARAS, MD;  Location: [facility];  Service: Cardiovascular;  Laterality: N/A;   Social History:  reports that she has quit smoking. Her smoking use included cigarettes. She has a 0.1 pack-year smoking history. She has never been exposed to tobacco smoke. She has never used smokeless tobacco. She reports that she does not drink alcohol and does not use drugs.  Allergies  Allergen Reactions   Hydralazine  Other (See Comments)   Hydralazine  Hcl Other (See Comments)    Hair loss   Hydrocodone Itching and Other (See Comments)    Upset stomach   Metformin  And Related Nausea And Vomiting and Other (See Comments)    Stomach pains, also   Other Nausea Only and Other (See Comments)    Lettuce- Does not digest this!!   Plaquenil [Hydroxychloroquine Sulfate] Hives   Shellfish Allergy Nausea And Vomiting   Shellfish-Derived Products Nausea Only and Other (See Comments)    Caused an upset stomach   Shrimp  (Diagnostic) Nausea Only and Other (See Comments)    Upset stomach    Sulfa Antibiotics Hives   Sulfur  Hives    Family History  Problem Relation Age of Onset   Hypertension Mother    Heart disease Mother    Diabetes Mother    Thyroid  disease Mother    Congestive Heart Failure Mother    Breast cancer Maternal Grandmother    Colon cancer Maternal Grandfather    Heart attack Sister    Heart disease Brother    Hyperlipidemia Brother    Hypertension Brother    Diabetes Father    Breast cancer Maternal Aunt     Prior to Admission medications   Medication Sig Start Date End Date Taking? Authorizing Provider  acetaminophen  (TYLENOL ) 500 MG tablet Take 1,000 mg by mouth every 8 (eight) hours as needed for mild pain (pain score 1-3), moderate pain (pain score 4-6) or headache.   Yes [provider]  albuterol  (VENTOLIN  HFA) 108 (90 Base) MCG/ACT inhaler Inhale 2 puffs into the lungs every 6 (six) hours as needed. 08/21/23  Yes [name], MD  ALPRAZolam  (XANAX ) 0.25 MG tablet Take 1 tablet (0.25 mg total) by  mouth on Monday, Wednesday and Friday at hemodialysis. 06/10/24  Yes [name], MD  amLODipine  (NORVASC ) 10 MG tablet Take 1 tablet (10 mg total) by mouth daily for hypertension 06/04/24  Yes [name], MD  aspirin  81 MG EC tablet Take 1 tablet (81 mg total) by mouth daily. 09/26/21  Yes [place], Harlene HERO, FNP  atorvastatin  (LIPITOR) 40 MG tablet Take 1 tablet (40 mg total) by mouth daily for cholesterol. 06/04/24  Yes [name], MD  ezetimibe  (ZETIA ) 10 MG tablet Take 1 tablet (10 mg total) by mouth daily for cholesterol. 06/04/24  Yes [name], MD  insulin  glargine (LANTUS  SOLOSTAR) 100 UNIT/ML Solostar Pen Inject 5 Units into the skin 2 (two) times daily. Increased by Provider on 6/24 06/16/24  Yes [name], MD  isosorbide  mononitrate (IMDUR ) 30 MG 24 hr tablet Take 2 tablets (60 mg total) by mouth daily for blood pressure.  06/04/24  Yes [name], MD  labetalol  (NORMODYNE ) 300 MG tablet Take 1 tablet (300 mg total) by mouth 2 (two) times daily. Patient taking differently: Take 300 mg by mouth daily. Take one tablet by mouth on Tues, Thurs, Saturday, and Sunday in the morning (non-dialysis days). Take one tablet by mouth on Mon, Wed,and Fri in the evening after dialysis. 06/04/24  Yes [name], MD  levothyroxine  (SYNTHROID ) 25 MCG tablet Take 1 tablet (25 mcg total) by mouth daily for low thyroid  hormone. 06/04/24  Yes [name], MD  Menthol, Topical Analgesic, (BIOFREEZE COOL THE PAIN) 4 % GEL Apply 1 Application topically as needed (Shoulder and knee pain.).   Yes [provider]  omeprazole  (PRILOSEC) 20 MG capsule Take 2 capsules (40 mg total) by mouth daily for acid reflux. 07/13/24  Yes [name], MD  ondansetron  (ZOFRAN ) 4 MG tablet Take 1 tablet (4 mg total) by mouth every 8 (eight) hours as needed for nausea. 04/29/24  Yes   polyethylene glycol powder (GLYCOLAX /MIRALAX ) 17 GM/SCOOP powder Mix 1 capful with 8 ounces of fluid and take by mouth daily as needed. 03/25/24  Yes [name], MD  sevelamer  carbonate (RENVELA ) 800 MG tablet Take 3 tablets (2,400 mg total) by mouth 3 (three) times daily with meals and 1 tablet with snacks. 09/04/23  Yes   Insulin  NPH Isophane & Regular (RELION 70/30 [place]) Inject 35 Units into the skin 2 (two) times daily.  08/17/14  [provider]    Physical Exam: Vitals:   08/10/24 1109 08/10/24 1411 08/10/24 1422 08/10/24 1500  BP: (!) 162/101 (!) 155/87 (!) 158/82 130/75  Pulse: 77 82 77 79  Resp: 15 14 20  (!) 21  Temp: 97.7 F (36.5 C) 97.9 F (36.6 C)    TempSrc: Oral     SpO2: 99% 98% 96% 96%  Weight:       Constitutional: NAD, calm, uncomfortable Respiratory: clear to auscultation bilaterally, no wheezing, no crackles. Normal respiratory effort. No accessory muscle use.  Room air Cardiovascular: Regular rate and  rhythm, no murmurs / rubs / gallops. No extremity edema.  Abdomen: Generalized tenderness with the degree of focal tenderness in the lower abdomen, no masses palpated. No hepatosplenomegaly. Bowel sounds positive.  Musculoskeletal: no clubbing / cyanosis. No joint deformity upper and lower extremities. Good ROM, no contractures. Normal muscle tone.  Skin: no rashes, lesions, ulcers. No induration Neurologic: CN 2-12 grossly intact. Sensation intact,  Strength 5/5 x all 4 extremities.  Psychiatric: Normal judgment and insight.  Alert and oriented x 3. Normal mood.   Vascular: Vas-Cath in place left subclavian  Data Reviewed:  Follow-up labs this morning: Sodium 132, potassium 5.5, chloride 97, CO2 23, glucose 180, BUN 41, creatinine 7.7, anion gap 12, GFR 5  Hemoglobin A1c 8.0  Imaging as above  Assessment and Plan: Lower GI bleeding with abdominal pain/CT consistent with descending colitis Gastroenterology has been consulted Follow-up on GI pathogen panel and C. difficile panel Unclear if infectious or ischemic etiology Current hemoglobin 13.3-will need to repeat hemoglobin while on dialysis Continues to have bleeding but this bleeding is not brisk Was given Flagyl  while in the ED-will wait for GI recommendations before resuming any antibacterial Allow ice chips and sips of clears IV Dilaudid  for abdominal pain Does have history of recurrent constipation and unclear how much this is contributing to the evolution of her colitis  Anemia of chronic kidney disease Continue to trend hemoglobin given above rectal bleed  CKD 5 on dialysis MWF Appreciate assistance of nephrology team No respiratory symptoms but potassium 5.5 and plans are to pursue dialysis today Bone mineral disease management per nephrology team  Diabetes mellitus 2 Hemoglobin A1c 8.0 Resume low-dose Lantus /insulin  glargine but while n.p.o. we will use half home dose Follow CBGs and provide very sensitive  SSI  Hypothyroidism Continue Synthroid   Hypertension Takes Normodyne  on nondialysis days Continue Imdur  and Norvasc  daily  HLD Hold Zetia  and statin until follow diet resume    Advance Care Planning:   Code Status: Full Code   VTE prophylaxis: SCDs during ongoing lower GI bleeding symptoms  Consults: Gastroenterology  Family Communication: Patient only  Severity of Illness: The appropriate patient status for this patient is OBSERVATION. Observation status is judged to be reasonable and necessary in order to provide the required intensity of service to ensure the patient's safety. The patient's presenting symptoms, physical exam findings, and initial radiographic and laboratory data in the context of their medical condition is felt to place them at decreased risk for further clinical deterioration. Furthermore, it is anticipated that the patient will be medically stable for discharge from the hospital within 2 midnights of admission.   Author: Darcel Dawley, MD 08/10/2024 3:21 PM  Attending Note: Agree with above nurse petitioner's note. Patient was seen and examined independently at bedside.  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  GI history significant for chronic constipation and recurrent nausea.  Presented to the ED with upper abdominal pain associated with nausea, vomiting, 2 episodes of bleeding per rectum that started on Sunday.  H&H remained stable.  CT abdomen and pelvis shows findings consistent with descending colitis. Patient is started on empiric antibiotics.All home medications resumed.  Nephrology consulted for continuation of hemodialysis.  GI is consulted suspecting infectious cause at this point.  Obtain C. difficile and GI panel.  if infectious workup is negative.  Patient may require colonoscopy.   Patient was seen and examined at bedside.    For on call review [email or web address].

## 2024-08-10 NOTE — Progress Notes (Signed)
 Pt receives out-pt HD at [facility] on [facility] on MWF 11:50 am chair time. Will assist as needed.   Randine Mungo Dialysis Navigator [phone]

## 2024-08-10 NOTE — Progress Notes (Signed)
 PHARMACY NOTE:  ANTIMICROBIAL RENAL DOSAGE ADJUSTMENT  Current antimicrobial regimen includes a mismatch between antimicrobial dosage and estimated renal function.  As per policy approved by the Pharmacy & Therapeutics and Medical Executive Committees, the antimicrobial dosage will be adjusted accordingly.  Current antimicrobial dosage:  Cipro  400 mg IV q 12h  Indication: Intra-abdominal infection  Renal Function:  Estimated Creatinine Clearance: 8.7 mL/min (A) (by C-G formula based on SCr of 7.7 mg/dL (H)). [x]      On intermittent HD, scheduled: Today []      On CRRT    Antimicrobial dosage has been changed to:  Cipro  400 mg IV q 24h  Additional comments:   Thank you for allowing pharmacy to be a part of this patient's care.  Dorn Poot, [facility] 08/10/2024 1:21 PM

## 2024-08-10 NOTE — Consult Note (Signed)
 Reason for Consult:ESRD Referring Physician: Dr. Jerral  Chief Complaint: diarrhea  HD Orders ([facility]) MWF [facility] 4h15min EDW 106.7kg (last HD 8/22 107.2kg) CVC, no active Mircera (last 50mcg given 7/30) Calcitriol  2mcg PO TID Sensipar  30mg  three times per week Heparin  5000 units   Assessment/Plan: ESRD -Patient is a very compliant patient and last treatment was on Friday for a full treatment leaving very close to her dry weight.  She has reached her dry weight at least twice in the past 3 treatments.  - Dialysis today second shift, orders already written.    Additional recommendations - Dose all meds for creatinine clearance < 10 ml/min  - Unless absolutely necessary, no MRIs with gadolinium.  - Prefer needle sticks in the dorsum of the hands or wrists.  No blood pressure measurements in right arm. - If blood transfusion is requested during hemodialysis sessions, please alert us  prior to the session.  - If a hemodialysis catheter line culture is requested, please alert us  as only hemodialysis nurses are able to collect those specimens.   Avoid nephrotoxic medications including NSAIDs and iodinated intravenous contrast exposure unless the latter is absolutely indicated.  Preferred narcotic agents for pain control are hydromorphone , fentanyl , and methadone. Morphine  should not be used. Avoid Baclofen and avoid oral sodium phosphate  and magnesium  citrate based laxatives / bowel preps. Continue strict Input and Output monitoring. Will monitor the patient closely with you and intervene or adjust therapy as indicated by changes in clinical status/labs   Renal osteodystrophy -check a phosphorus for binder management; for now continue binders. Anemia -Last Mircera was given 7/30 and she has been taken off protocol as her hemoglobin has been above 12. Colitis w/ diarrhea -  DM - per primary HTN -start home regiment.  Ultrafiltration will help as well.  HPI 63 year old woman with a history  of carotid artery stenosis, coronary atherosclerotic heart disease, diabetes, hyperlipidemia, hypertension, diabetic gastroparesis, PAD, CVA; patient dialyzes Monday Wednesday Fridays at [facility] with last treatment on 8/22 for full treatment of 4 hours of 50 minutes leaving at 107.2 kg, her EDW is 106.7 kg and she has reached in the past 3 treatments. She is presenting with abdominal pain, vomiting and diarrhea with bright red blood per rectum as well.  States that the pain is 8 out of 10, worse on the right side and Tylenol  does not alleviate the pain at all.  Patient has noted again bright red blood per rectum, diarrhea and this is all been going on for 24 hours.  She has had abdominal pain chronically but this is much worse than usual.  She denies any fever, chills, myalgias, sick contacts; lives with her husband who helps to take care of her.  She denies any chest pain, shortness of breath; she does still make urine and denies any dysuria or hematuria.  ROS Pertinent items are noted in HPI.  Chemistry and CBC: Creat  Date/Time Value Ref Range Status  08/17/2014 04:41 PM 0.73 0.50 - 1.10 mg/dL Final   Creatinine, Ser  Date/Time Value Ref Range Status  08/10/2024 06:51 AM 7.70 (H) 0.44 - 1.00 mg/dL Final  91/75/7974 89:49 PM 7.38 (H) 0.44 - 1.00 mg/dL Final  98/92/7974 93:92 AM 5.10 (H) 0.44 - 1.00 mg/dL Final  91/92/7975 87:75 PM 6.57 (H) 0.44 - 1.00 mg/dL Final  95/92/7975 96:71 AM 6.07 (H) 0.44 - 1.00 mg/dL Final  95/93/7975 98:72 AM 6.80 (H) 0.44 - 1.00 mg/dL Final  95/94/7975 95:98 PM 6.23 (H) 0.44 -  1.00 mg/dL Final  96/74/7975 96:56 AM 7.39 (H) 0.44 - 1.00 mg/dL Final  97/78/7975 93:99 AM 5.10 (H) 0.44 - 1.00 mg/dL Final  97/78/7975 94:89 AM 4.72 (H) 0.44 - 1.00 mg/dL Final  87/93/7976 98:96 AM 5.57 (H) 0.44 - 1.00 mg/dL Final  87/94/7976 98:60 AM 4.31 (H) 0.44 - 1.00 mg/dL Final  87/95/7976 91:42 PM 4.04 (H) 0.44 - 1.00 mg/dL Final  87/95/7976 93:54 AM 3.33 (H) 0.44 - 1.00 mg/dL Final   87/96/7976 88:83 AM 4.69 (H) 0.44 - 1.00 mg/dL Final  88/79/7976 98:90 AM 4.66 (H) 0.44 - 1.00 mg/dL Final  88/80/7976 96:42 AM 6.89 (H) 0.44 - 1.00 mg/dL Final  88/80/7976 98:82 AM 6.87 (H) 0.44 - 1.00 mg/dL Final  89/89/7976 88:47 AM 4.50 (H) 0.44 - 1.00 mg/dL Final  91/77/7976 91:51 AM 3.40 (H) 0.44 - 1.00 mg/dL Final  93/81/7976 89:59 AM 3.34 (H) 0.44 - 1.00 mg/dL Final  93/85/7976 96:41 AM 2.58 (H) 0.44 - 1.00 mg/dL Final  93/86/7976 91:66 PM 2.42 (H) 0.44 - 1.00 mg/dL Final  95/75/7976 92:75 AM 5.20 (H) 0.44 - 1.00 mg/dL Final  95/78/7976 98:54 AM 5.62 (H) 0.44 - 1.00 mg/dL Final  95/79/7976 98:63 AM 4.54 (H) 0.44 - 1.00 mg/dL Final    Comment:    DELTA CHECK NOTED  04/04/2022 01:36 AM 2.95 (H) 0.44 - 1.00 mg/dL Final  95/81/7976 89:56 AM 3.88 (H) 0.44 - 1.00 mg/dL Final  95/84/7976 87:41 AM 3.22 (H) 0.44 - 1.00 mg/dL Final  95/85/7976 96:69 AM 4.15 (H) 0.44 - 1.00 mg/dL Final  95/86/7976 93:60 PM 3.95 (H) 0.44 - 1.00 mg/dL Final  95/97/7976 95:65 AM 4.60 (H) 0.44 - 1.00 mg/dL Final  96/92/7976 87:83 PM 5.54 (H) 0.44 - 1.00 mg/dL Final  96/93/7976 90:50 AM 5.20 (H) 0.44 - 1.00 mg/dL Final  96/95/7976 92:50 AM 5.03 (H) 0.44 - 1.00 mg/dL Final  96/97/7976 92:55 PM 6.17 (H) 0.44 - 1.00 mg/dL Final  96/97/7976 91:61 AM 7.30 (H) 0.44 - 1.00 mg/dL Final  96/98/7976 95:60 PM 6.20 (H) 0.44 - 1.00 mg/dL Final  96/98/7976 96:99 PM 6.03 (H) 0.44 - 1.00 mg/dL Final  97/78/7976 88:41 AM 5.97 (H) 0.44 - 1.00 mg/dL Final  97/89/7976 87:44 PM 5.59 (H) 0.44 - 1.00 mg/dL Final  97/96/7976 87:64 PM 5.31 (H) 0.44 - 1.00 mg/dL Final  98/79/7976 87:53 PM 4.76 (H) 0.44 - 1.00 mg/dL Final  98/86/7976 97:74 PM 4.27 (H) 0.44 - 1.00 mg/dL Final  98/92/7976 89:99 AM 4.42 (H) 0.44 - 1.00 mg/dL Final  98/92/7976 94:48 AM 4.21 (H) 0.44 - 1.00 mg/dL Final  98/93/7976 88:60 AM 4.20 (H) 0.44 - 1.00 mg/dL Final  87/70/7977 87:66 PM 4.01 (H) 0.44 - 1.00 mg/dL Final  88/71/7977 88:49 AM 3.52 (H) 0.44 - 1.00  mg/dL Final  88/81/7977 88:58 AM 3.54 (H) 0.44 - 1.00 mg/dL Final  88/83/7977 97:61 AM 3.43 (H) 0.44 - 1.00 mg/dL Final  88/84/7977 96:94 AM 3.45 (H) 0.44 - 1.00 mg/dL Final   Recent Labs  Lab 08/09/24 2250 08/10/24 0651  NA 133* 132*  K 5.3* 5.5*  CL 97* 97*  CO2 23 23  GLUCOSE 172* 180*  BUN 39* 41*  CREATININE 7.38* 7.70*  CALCIUM  9.2 9.0   Recent Labs  Lab 08/09/24 2250  WBC 14.0*  NEUTROABS 11.0*  HGB 13.3  HCT 41.8  MCV 94.4  PLT 215   Liver Function Tests: Recent Labs  Lab 08/09/24 2250  AST 17  ALT 18  ALKPHOS 93  BILITOT  0.7  PROT 7.5  ALBUMIN  3.6   Recent Labs  Lab 08/09/24 2250  LIPASE 28   No results for input(s): AMMONIA in the last 168 hours. Cardiac Enzymes: No results for input(s): CKTOTAL, CKMB, CKMBINDEX, TROPONINI in the last 168 hours. Iron  Studies: No results for input(s): IRON , TIBC, TRANSFERRIN, FERRITIN in the last 72 hours. PT/INR: @LABRCNTIP (inr:5)  Xrays/Other Studies: ) Results for orders placed or performed during the hospital encounter of 08/09/24 (from the past 48 hours)  CBG monitoring, ED     Status: Abnormal   Collection Time: 08/09/24 10:49 PM  Result Value Ref Range   Glucose-Capillary 145 (H) 70 - 99 mg/dL    Comment: Glucose reference range applies only to samples taken after fasting for at least 8 hours.  CBC with Differential     Status: Abnormal   Collection Time: 08/09/24 10:50 PM  Result Value Ref Range   WBC 14.0 (H) 4.0 - 10.5 K/uL   RBC 4.43 3.87 - 5.11 MIL/uL   Hemoglobin 13.3 12.0 - 15.0 g/dL   HCT 58.1 63.9 - 53.9 %   MCV 94.4 80.0 - 100.0 fL   MCH 30.0 26.0 - 34.0 pg   MCHC 31.8 30.0 - 36.0 g/dL   RDW 85.9 88.4 - 84.4 %   Platelets 215 150 - 400 K/uL   nRBC 0.0 0.0 - 0.2 %   Neutrophils Relative % 78 %   Neutro Abs 11.0 (H) 1.7 - 7.7 K/uL   Lymphocytes Relative 12 %   Lymphs Abs 1.6 0.7 - 4.0 K/uL   Monocytes Relative 7 %   Monocytes Absolute 1.0 0.1 - 1.0 K/uL    Eosinophils Relative 2 %   Eosinophils Absolute 0.3 0.0 - 0.5 K/uL   Basophils Relative 1 %   Basophils Absolute 0.1 0.0 - 0.1 K/uL   Immature Granulocytes 0 %   Abs Immature Granulocytes 0.05 0.00 - 0.07 K/uL    Comment: Performed at [facility], [address] [place], [place] [address]  Comprehensive metabolic panel     Status: Abnormal   Collection Time: 08/09/24 10:50 PM  Result Value Ref Range   Sodium 133 (L) 135 - 145 mmol/L   Potassium 5.3 (H) 3.5 - 5.1 mmol/L   Chloride 97 (L) 98 - 111 mmol/L   CO2 23 22 - 32 mmol/L   Glucose, Bld 172 (H) 70 - 99 mg/dL    Comment: Glucose reference range applies only to samples taken after fasting for at least 8 hours.   BUN 39 (H) 8 - 23 mg/dL   Creatinine, Ser 2.61 (H) 0.44 - 1.00 mg/dL   Calcium  9.2 8.9 - 10.3 mg/dL   Total Protein 7.5 6.5 - 8.1 g/dL   Albumin  3.6 3.5 - 5.0 g/dL   AST 17 15 - 41 U/L   ALT 18 0 - 44 U/L   Alkaline Phosphatase 93 38 - 126 U/L   Total Bilirubin 0.7 0.0 - 1.2 mg/dL   GFR, Estimated 6 (L) >60 mL/min    Comment: (NOTE) Calculated using the CKD-EPI Creatinine Equation (2021)    Anion gap 13 5 - 15    Comment: Performed at [facility], [address] [place], [place] [address]  Lipase, blood     Status: None   Collection Time: 08/09/24 10:50 PM  Result Value Ref Range   Lipase 28 11 - 51 U/L    Comment: Performed at [facility], [address],  [place], [place] [address]  Basic metabolic panel     Status: Abnormal   Collection Time: 08/10/24  6:51 AM  Result Value Ref Range   Sodium 132 (L) 135 - 145 mmol/L   Potassium 5.5 (H) 3.5 - 5.1 mmol/L   Chloride 97 (L) 98 - 111 mmol/L   CO2 23 22 - 32 mmol/L   Glucose, Bld 180 (H) 70 - 99 mg/dL    Comment: Glucose reference range applies only to samples taken after fasting for at least 8 hours.   BUN 41 (H) 8 - 23 mg/dL   Creatinine, Ser 2.29 (H) 0.44 - 1.00 mg/dL   Calcium  9.0 8.9 - 10.3 mg/dL   GFR, Estimated 5 (L) >60 mL/min     Comment: (NOTE) Calculated using the CKD-EPI Creatinine Equation (2021)    Anion gap 12 5 - 15    Comment: Performed at [facility], [address], [place], [place] [address]  Hemoglobin A1c     Status: Abnormal   Collection Time: 08/10/24  6:51 AM  Result Value Ref Range   Hgb A1c MFr Bld 8.0 (H) 4.8 - 5.6 %    Comment: (NOTE) Diagnosis of Diabetes The following HbA1c ranges recommended by the American Diabetes Association (ADA) may be used as an aid in the diagnosis of diabetes mellitus.  Hemoglobin             Suggested A1C NGSP%              Diagnosis  <5.7                   Non Diabetic  5.7-6.4                Pre-Diabetic  >6.4                   Diabetic  <7.0                   Glycemic control for                       adults with diabetes.     Mean Plasma Glucose 182.9 mg/dL    Comment: Performed at [facility], [address], [place], [place] [address]  CBG monitoring, ED     Status: Abnormal   Collection Time: 08/10/24  7:36 AM  Result Value Ref Range   Glucose-Capillary 175 (H) 70 - 99 mg/dL    Comment: Glucose reference range applies only to samples taken after fasting for at least 8 hours.   CT ABDOMEN PELVIS W CONTRAST Result Date: 08/10/2024 CLINICAL DATA:  upper abdominal pain that started today. Pt reports couple episodes of emesi EXAM: CT ABDOMEN AND PELVIS WITH CONTRAST TECHNIQUE: Multidetector CT imaging of the abdomen and pelvis was performed using the standard protocol following bolus administration of intravenous contrast. RADIATION DOSE REDUCTION: This exam was performed according to the departmental dose-optimization program which includes automated exposure control, adjustment of the mA and/or kV according to patient size and/or use of iterative reconstruction technique. CONTRAST:  75mL OMNIPAQUE  IOHEXOL  350 MG/ML SOLN COMPARISON:  CT abdomen pelvis 05/30/2022 FINDINGS: Lower chest: Partially visualized central venous catheter with tip  terminating within the right atrium. Tiny hiatal hernia. Hepatobiliary: No focal liver abnormality. Status post cholecystectomy. No biliary dilatation. Pancreas: No focal lesion. Normal pancreatic contour. No surrounding inflammatory changes. No main pancreatic ductal dilatation. Spleen: Normal in size without focal abnormality.  Adrenals/Urinary Tract: No adrenal nodule bilaterally. Bilateral kidneys enhance symmetrically. No hydronephrosis. No hydroureter. The urinary bladder is unremarkable. Stomach/Bowel: Stomach is within normal limits. No evidence of small bowel wall thickening or dilatation. Few scattered small and large bowel diverticula. Descending colon bowel wall thickening and pericolonic fat stranding. Appendix appears normal. Vascular/Lymphatic: No abdominal aorta or iliac aneurysm. Mild atherosclerotic plaque of the aorta and its branches. No abdominal, pelvic, or inguinal lymphadenopathy. Reproductive: Status post hysterectomy. No adnexal masses. Other: No intraperitoneal free fluid. No intraperitoneal free gas. No organized fluid collection. Musculoskeletal: No abdominal wall hernia or abnormality. No suspicious lytic or blastic osseous lesions. No acute displaced fracture. IMPRESSION: 1. Descending colitis. 2. Few scattered small and large bowel diverticula. No acute diverticulitis. 3. Tiny hiatal hernia. Electronically Signed   By: Morgane  Naveau M.D.   On: 08/10/2024 01:16    PMH:   Past Medical History:  Diagnosis Date   Anemia 2006   Anginal pain (HCC)    Carotid artery disease (HCC)    right ICA occlusion, 40-59% LICA 02/2021 US    CHF (congestive heart failure) (HCC)    Coronary artery disease    Depression 2014   previously on amitryptiline    Diabetes mellitus with neurological manifestation (HCC) 2006   Diabetic peripheral neuropathy (HCC) 04/26/2020   Dysphagia    Dyspnea    ESRD on hemodialysis (HCC)    Fracture of left ankle 1997   Gastroparesis 07/2016   Heart murmur     HOH (hard of hearing) 2004   Hyperlipidemia 2006   Hypertension 2006   IBS (irritable bowel syndrome) 2002   Leukopenia 2015   Macular degeneration 11/2019   Peripheral vascular disease (HCC)    Shingles 2009   Stroke [facility]) 2020   Thyroid  nodule 2004    PSH:   Past Surgical History:  Procedure Laterality Date   A/V FISTULAGRAM Right 09/25/2022   Procedure: A/V Fistulagram;  Surgeon: Serene Gaile ORN, MD;  Location: [facility] INVASIVE CV LAB;  Service: Cardiovascular;  Laterality: Right;   ABDOMINAL HYSTERECTOMY  2005   AMPUTATION Bilateral 04/04/2022   Procedure: BILATERAL BELOW KNEE AMPUTATION;  Surgeon: Harden Jerona GAILS, MD;  Location: [facility] OR;  Service: Orthopedics;  Laterality: Bilateral;   AV FISTULA PLACEMENT Right 12/22/2021   Procedure: RIGHT ARM ARTERIOVENOUS (AV) BRACIOCPHELAIC FISTULA CREATION;  Surgeon: Sheree Penne Bruckner, MD;  Location: [facility] OR;  Service: Vascular;  Laterality: Right;   AV FISTULA PLACEMENT Right 02/19/2022   Procedure: RIGHT ARTERIOVENOUS FISTULA;  Surgeon: Eliza Bruckner RAMAN, MD;  Location: [facility] OR;  Service: Vascular;  Laterality: Right;   AV FISTULA PLACEMENT Left 12/24/2023   Procedure: LEFT BRACHIOCEPHALIC ARTERIOVENOUS (AV) FISTULA CREATION;  Surgeon: Lanis Fonda BRAVO, MD;  Location: [facility] OR;  Service: Vascular;  Laterality: Left;   BASCILIC VEIN TRANSPOSITION Right 08/07/2022   Procedure: SECOND STAGE RIGHT BASILIC VEIN TRANSPOSITION;  Surgeon: Eliza Bruckner RAMAN, MD;  Location: [facility] OR;  Service: Vascular;  Laterality: Right;   CATARACT EXTRACTION Left 11/2019   CESAREAN SECTION  1983   CHOLECYSTECTOMY N/A 03/05/2020   Procedure: LAPAROSCOPIC CHOLECYSTECTOMY WITH INTRAOPERATIVE CHOLANGIOGRAM;  Surgeon: Belinda Cough, MD;  Location: [facility] ORS;  Service: General;  Laterality: N/A;   COLONOSCOPY     [facility] said no polyps were found   ESOPHAGOGASTRODUODENOSCOPY (EGD) WITH PROPOFOL  Left 08/26/2014   Procedure: ESOPHAGOGASTRODUODENOSCOPY (EGD) WITH  PROPOFOL ;  Surgeon: Elsie Cree, MD;  Location: [facility] ENDOSCOPY;  Service: Endoscopy;  Laterality: Left;   ESOPHAGOGASTRODUODENOSCOPY (EGD) WITH  PROPOFOL  N/A 03/03/2020   Procedure: ESOPHAGOGASTRODUODENOSCOPY (EGD) WITH PROPOFOL ;  Surgeon: Albertus Gordy HERO, MD;  Location: [facility] ENDOSCOPY;  Service: Gastroenterology;  Laterality: N/A;   IR FLUORO GUIDE CV LINE RIGHT  02/15/2022   IR US  GUIDE VASC ACCESS RIGHT  02/15/2022   PERIPHERAL VASCULAR BALLOON ANGIOPLASTY Right 09/25/2022   Procedure: PERIPHERAL VASCULAR BALLOON ANGIOPLASTY;  Surgeon: Serene Gaile ORN, MD;  Location: [facility];  Service: Cardiovascular;  Laterality: Right;  AVF   RIGHT HEART CATH N/A 07/14/2021   Procedure: RIGHT HEART CATH;  Surgeon: Rolan Ezra RAMAN, MD;  Location: [facility] INVASIVE CV LAB;  Service: Cardiovascular;  Laterality: N/A;   RIGHT/LEFT HEART CATH AND CORONARY ANGIOGRAPHY N/A 04/27/2021   Procedure: RIGHT/LEFT HEART CATH AND CORONARY ANGIOGRAPHY;  Surgeon: Court Dorn PARAS, MD;  Location: [facility];  Service: Cardiovascular;  Laterality: N/A;    Allergies:  Allergies  Allergen Reactions   Hydralazine  Other (See Comments)   Hydralazine  Hcl Other (See Comments)    Hair loss   Hydrocodone Itching and Other (See Comments)    Upset stomach   Metformin  And Related Nausea And Vomiting and Other (See Comments)    Stomach pains, also   Other Nausea Only and Other (See Comments)    Lettuce- Does not digest this!!   Plaquenil [Hydroxychloroquine Sulfate] Hives   Shellfish Allergy Nausea And Vomiting   Shellfish-Derived Products Nausea Only and Other (See Comments)    Caused an upset stomach   Shrimp (Diagnostic) Nausea Only and Other (See Comments)    Upset stomach    Sulfa Antibiotics Hives   Sulfur  Hives    Medications:   Prior to Admission medications   Medication Sig Start Date End Date Taking? Authorizing Provider  acetaminophen  (TYLENOL ) 500 MG tablet Take 1,000 mg by mouth every 8 (eight)  hours as needed for mild pain (pain score 1-3), moderate pain (pain score 4-6) or headache.   Yes [provider]  albuterol  (VENTOLIN  HFA) 108 (90 Base) MCG/ACT inhaler Inhale 2 puffs into the lungs every 6 (six) hours as needed. 08/21/23  Yes [name], MD  ALPRAZolam  (XANAX ) 0.25 MG tablet Take 1 tablet (0.25 mg total) by mouth on Monday, Wednesday and Friday at hemodialysis. 06/10/24  Yes [name], MD  amLODipine  (NORVASC ) 10 MG tablet Take 1 tablet (10 mg total) by mouth daily for hypertension 06/04/24  Yes [name], MD  aspirin  81 MG EC tablet Take 1 tablet (81 mg total) by mouth daily. 09/26/21  Yes Milford, Harlene HERO, FNP  atorvastatin  (LIPITOR) 40 MG tablet Take 1 tablet (40 mg total) by mouth daily for cholesterol. 06/04/24  Yes [name], MD  ezetimibe  (ZETIA ) 10 MG tablet Take 1 tablet (10 mg total) by mouth daily for cholesterol. 06/04/24  Yes [name], MD  insulin  glargine (LANTUS  SOLOSTAR) 100 UNIT/ML Solostar Pen Inject 5 Units into the skin 2 (two) times daily. Increased by Provider on 6/24 06/16/24  Yes [name], MD  isosorbide  mononitrate (IMDUR ) 30 MG 24 hr tablet Take 2 tablets (60 mg total) by mouth daily for blood pressure. 06/04/24  Yes [name], MD  labetalol  (NORMODYNE ) 300 MG tablet Take 1 tablet (300 mg total) by mouth 2 (two) times daily. Patient taking differently: Take 300 mg by mouth daily. Take one tablet by mouth on Tues, Thurs, Saturday, and Sunday in the morning (non-dialysis days). Take one tablet by mouth on Mon, Wed,and Fri in the  evening after dialysis. 06/04/24  Yes [name], MD  levothyroxine  (SYNTHROID ) 25 MCG tablet Take 1 tablet (25 mcg total) by mouth daily for low thyroid  hormone. 06/04/24  Yes [name], MD  Menthol, Topical Analgesic, (BIOFREEZE COOL THE PAIN) 4 % GEL Apply 1 Application topically as needed (Shoulder and knee pain.).   Yes [provider]   omeprazole  (PRILOSEC) 20 MG capsule Take 2 capsules (40 mg total) by mouth daily for acid reflux. 07/13/24  Yes [name], MD  ondansetron  (ZOFRAN ) 4 MG tablet Take 1 tablet (4 mg total) by mouth every 8 (eight) hours as needed for nausea. 04/29/24  Yes   polyethylene glycol powder (GLYCOLAX /MIRALAX ) 17 GM/SCOOP powder Mix 1 capful with 8 ounces of fluid and take by mouth daily as needed. 03/25/24  Yes [name], MD  sevelamer  carbonate (RENVELA ) 800 MG tablet Take 3 tablets (2,400 mg total) by mouth 3 (three) times daily with meals and 1 tablet with snacks. 09/04/23  Yes   Insulin  NPH Isophane & Regular (RELION 70/30 [place]) Inject 35 Units into the skin 2 (two) times daily.  08/17/14  [provider]    Discontinued Meds:   Medications Discontinued During This Encounter  Medication Reason   lactated ringers  bolus 1,000 mL    vancomycin  (VANCOCIN ) capsule 125 mg    fentaNYL  (SUBLIMAZE ) injection 50 mcg    Blood Glucose Monitoring Suppl (ACCU-CHEK GUIDE) w/Device KIT Completed Course   Continuous Glucose Receiver (FREESTYLE LIBRE 3 READER) DEVI    Continuous Glucose Sensor (FREESTYLE LIBRE 3 PLUS SENSOR) MISC    Multiple Vitamin (MULTI-VITAMIN DAILY) TABS Patient Preference   Accu-Chek Softclix Lancets lancets    glucose blood (ACCU-CHEK GUIDE) test strip    Insulin  Pen Needle (PEN NEEDLES) 31G X 8 MM MISC    fentaNYL  (SUBLIMAZE ) injection 25-50 mcg     Social History:  reports that she has quit smoking. Her smoking use included cigarettes. She has a 0.1 pack-year smoking history. She has never been exposed to tobacco smoke. She has never used smokeless tobacco. She reports that she does not drink alcohol and does not use drugs.  Family History:   Family History  Problem Relation Age of Onset   Hypertension Mother    Heart disease Mother    Diabetes Mother    Thyroid  disease Mother    Congestive Heart Failure Mother    Breast cancer Maternal Grandmother    Colon  cancer Maternal Grandfather    Heart attack Sister    Heart disease Brother    Hyperlipidemia Brother    Hypertension Brother    Diabetes Father    Breast cancer Maternal Aunt     Blood pressure (!) 156/94, pulse 85, temperature 98.1 F (36.7 C), temperature source Oral, resp. rate 15, weight 108 kg, SpO2 99%. General appearance: alert, cooperative, and appears stated age Head: Normocephalic, without obvious abnormality, atraumatic Eyes: negative Neck: no adenopathy, no carotid bruit, supple, symmetrical, trachea midline, and thyroid  not enlarged, symmetric, no tenderness/mass/nodules Back: symmetric, no curvature. ROM normal. No CVA tenderness. 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

## 2024-08-11 ENCOUNTER — Other Ambulatory Visit (HOSPITAL_COMMUNITY): Payer: Self-pay

## 2024-08-11 ENCOUNTER — Encounter: Payer: Self-pay | Admitting: Nurse Practitioner

## 2024-08-11 ENCOUNTER — Encounter (HOSPITAL_COMMUNITY): Payer: Self-pay | Admitting: Family Medicine

## 2024-08-11 DIAGNOSIS — K625 Hemorrhage of anus and rectum: Secondary | ICD-10-CM | POA: Diagnosis not present

## 2024-08-11 DIAGNOSIS — K529 Noninfective gastroenteritis and colitis, unspecified: Secondary | ICD-10-CM | POA: Diagnosis not present

## 2024-08-11 DIAGNOSIS — R933 Abnormal findings on diagnostic imaging of other parts of digestive tract: Secondary | ICD-10-CM | POA: Diagnosis not present

## 2024-08-11 LAB — COMPREHENSIVE METABOLIC PANEL WITH GFR
ALT: 16 U/L (ref 0–44)
AST: 14 U/L — ABNORMAL LOW (ref 15–41)
Albumin: 3.4 g/dL — ABNORMAL LOW (ref 3.5–5.0)
Alkaline Phosphatase: 84 U/L (ref 38–126)
Anion gap: 13 (ref 5–15)
BUN: 33 mg/dL — ABNORMAL HIGH (ref 8–23)
CO2: 26 mmol/L (ref 22–32)
Calcium: 8.9 mg/dL (ref 8.9–10.3)
Chloride: 93 mmol/L — ABNORMAL LOW (ref 98–111)
Creatinine, Ser: 6.94 mg/dL — ABNORMAL HIGH (ref 0.44–1.00)
GFR, Estimated: 6 mL/min — ABNORMAL LOW (ref >60)
Glucose, Bld: 125 mg/dL — ABNORMAL HIGH (ref 70–99)
Potassium: 4.2 mmol/L (ref 3.5–5.1)
Sodium: 132 mmol/L — ABNORMAL LOW (ref 135–145)
Total Bilirubin: 0.7 mg/dL (ref 0.0–1.2)
Total Protein: 7.1 g/dL (ref 6.5–8.1)

## 2024-08-11 LAB — CBC WITH DIFFERENTIAL/PLATELET
Abs Immature Granulocytes: 0.05 K/uL (ref 0.00–0.07)
Basophils Absolute: 0.1 K/uL (ref 0.0–0.1)
Basophils Relative: 1 %
Eosinophils Absolute: 0.3 K/uL (ref 0.0–0.5)
Eosinophils Relative: 2 %
HCT: 37.8 % (ref 36.0–46.0)
Hemoglobin: 11.9 g/dL — ABNORMAL LOW (ref 12.0–15.0)
Immature Granulocytes: 0 %
Lymphocytes Relative: 20 %
Lymphs Abs: 2.7 K/uL (ref 0.7–4.0)
MCH: 29.5 pg (ref 26.0–34.0)
MCHC: 31.5 g/dL (ref 30.0–36.0)
MCV: 93.8 fL (ref 80.0–100.0)
Monocytes Absolute: 1.3 K/uL — ABNORMAL HIGH (ref 0.1–1.0)
Monocytes Relative: 10 %
Neutro Abs: 8.9 K/uL — ABNORMAL HIGH (ref 1.7–7.7)
Neutrophils Relative %: 67 %
Platelets: 218 K/uL (ref 150–400)
RBC: 4.03 MIL/uL (ref 3.87–5.11)
RDW: 14.5 % (ref 11.5–15.5)
WBC: 13.3 K/uL — ABNORMAL HIGH (ref 4.0–10.5)
nRBC: 0 % (ref 0.0–0.2)

## 2024-08-11 LAB — PROCALCITONIN: Procalcitonin: 0.39 ng/mL

## 2024-08-11 LAB — GLUCOSE, CAPILLARY
Glucose-Capillary: 121 mg/dL — ABNORMAL HIGH (ref 70–99)
Glucose-Capillary: 120 mg/dL — ABNORMAL HIGH (ref 70–99)
Glucose-Capillary: 140 mg/dL — ABNORMAL HIGH (ref 70–99)
Glucose-Capillary: 186 mg/dL — ABNORMAL HIGH (ref 70–99)
Glucose-Capillary: 210 mg/dL — ABNORMAL HIGH (ref 70–99)

## 2024-08-11 LAB — C-REACTIVE PROTEIN: CRP: 2.6 mg/dL — ABNORMAL HIGH (ref <1.0)

## 2024-08-11 LAB — MAGNESIUM: Magnesium: 2.3 mg/dL (ref 1.7–2.4)

## 2024-08-11 MED ORDER — ATORVASTATIN CALCIUM 40 MG PO TABS
40.0000 mg | ORAL_TABLET | Freq: Every day | ORAL | Status: DC
  Administered 2024-08-11 – 2024-08-14 (×4): 40 mg via ORAL
  Filled 2024-08-11 (×4): qty 1

## 2024-08-11 MED ORDER — PANTOPRAZOLE SODIUM 40 MG PO TBEC
40.0000 mg | DELAYED_RELEASE_TABLET | Freq: Every day | ORAL | Status: DC
  Administered 2024-08-11 – 2024-08-14 (×4): 40 mg via ORAL
  Filled 2024-08-11 (×4): qty 1

## 2024-08-11 MED ORDER — EZETIMIBE 10 MG PO TABS
10.0000 mg | ORAL_TABLET | Freq: Every day | ORAL | Status: DC
  Administered 2024-08-11 – 2024-08-14 (×4): 10 mg via ORAL
  Filled 2024-08-11 (×4): qty 1

## 2024-08-11 MED ORDER — HEPARIN SODIUM (PORCINE) 5000 UNIT/ML IJ SOLN: 5000.0000 [IU] | Freq: Three times a day (TID) | INTRAMUSCULAR | Status: DC

## 2024-08-11 MED ORDER — SEVELAMER CARBONATE 800 MG PO TABS
1600.0000 mg | ORAL_TABLET | Freq: Three times a day (TID) | ORAL | Status: DC
  Administered 2024-08-11 – 2024-08-13 (×7): 1600 mg via ORAL
  Filled 2024-08-11 (×7): qty 2

## 2024-08-11 NOTE — Progress Notes (Signed)
 PROGRESS NOTE                                                                                                                                                                                                             Patient Demographics:    Nancy [name], is a 63 y.o. female, DOB - [birth year], FMW:[id number]  Outpatient Primary MD for the patient is [name], MD    LOS - 1  Admit date - 08/09/2024    Chief Complaint  Patient presents with   Abdominal Pain       Brief Narrative (HPI from H&P)   63 y.o. 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  GI history significant for chronic constipation and recurrent nausea.  Presented to the ED with upper abdominal pain that started on Sunday.  Has had several episodes of emesis and 2 definitive episodes of bright red blood per rectum prior to arrival.  Since arrival to the ED patient reports every time she has dry heaves she passes bright red blood per rectum.  There is a smear of bright red blood noted on the bedsheet.  In the ED she was afebrile with initial blood pressure readings hypertensive at 171/110 but later decreased to 96 after receiving pain medication.  She was given a 500 cc lactated ringer  bolus.  Initial hemoglobin was 13.3.  Last dialysis Friday and current creatinine 7.38 with potassium of 5.3.  BC 15,000.  CT scan demonstrated ascending colitis.  Hospitalist service was consulted to evaluate the patient for admission.    Subjective:    Nancy [name] today has, No headache, No chest pain, No abdominal pain - No Nausea, improved diarrhea no new weakness tingling or numbness, no SOB   Assessment  & Plan :   Acute lower GI bleeding with abdominal pain/CT consistent with descending colitis Gastroenterology has been consulted, with supportive care her  symptoms are much better, no diarrhea, stool studies pending however currently no diarrhea and suspicion for C. difficile is low, continue Cipro  Flagyl , continue to monitor CBC, type screen done.  Symptoms much improved.   Anemia of chronic kidney disease Continue to trend hemoglobin given above rectal bleed, she is agreeable for transfusion if needed   CKD 5 on dialysis  MWF Nephrology on board   Hypothyroidism Continue Synthroid    Hypertension Takes Normodyne  on nondialysis days Continue Imdur  and Norvasc  daily   HLD Hold Zetia  and statin until follow diet resume  Diabetes mellitus 2 Hemoglobin A1c 8.0 Resume low-dose Lantus /insulin  glargine but while n.p.o. we will use half home dose Follow CBGs and provide very sensitive SSI  CBG (last 3)  Recent Labs    08/10/24 2334 08/11/24 0439 08/11/24 0805  GLUCAP 185* 121* 120*   Lab Results  Component Value Date   HGBA1C 8.0 (H) 08/10/2024           Condition - Fair  Family Communication  :  None  Code Status :  Full  Consults  :  GI, Renal  PUD Prophylaxis :     Procedures  :     CT 1. Descending colitis. 2. Few scattered small and large bowel diverticula. No acute diverticulitis. 3. Tiny hiatal hernia      Disposition Plan  :    Status is: Inpatient  DVT Prophylaxis  :    SCDs Start: 08/10/24 9379   Lab Results  Component Value Date   PLT 218 08/11/2024    Diet :  Diet Order             DIET SOFT Room service appropriate? Yes; Fluid consistency: Thin  Diet effective now                    Inpatient Medications  Scheduled Meds:  amLODipine   10 mg Oral Daily   calcitRIOL   2 mcg Oral Q M,W,F   Chlorhexidine  Gluconate Cloth  6 each Topical Q0600   cinacalcet   30 mg Oral Once per day on Monday Wednesday Friday   insulin  aspart  0-6 Units Subcutaneous Q4H   insulin  glargine  2 Units Subcutaneous BID   isosorbide  mononitrate  60 mg Oral Daily   labetalol   300 mg Oral Daily    levothyroxine   25 mcg Oral Daily   sevelamer  carbonate  1,600 mg Oral TID WC   sodium chloride  flush  3 mL Intravenous Q12H   Continuous Infusions: PRN Meds:.acetaminophen  **OR** acetaminophen , ALPRAZolam , HYDROmorphone  (DILAUDID ) injection, prochlorperazine     Objective:   Vitals:   08/11/24 0000 08/11/24 0400 08/11/24 0500 08/11/24 0803  BP: (!) 131/117 (!) 113/56  127/63  Pulse: 79 76    Resp: 14 11    Temp: (!) 97.4 F (36.3 C) 98.6 F (37 C)  97.6 F (36.4 C)  TempSrc: Axillary Oral  Oral  SpO2: 100% 99%    Weight:   108.6 kg     Wt Readings from Last 3 Encounters:  08/11/24 108.6 kg  07/16/24 112.6 kg  07/14/24 107.5 kg     Intake/Output Summary (Last 24 hours) at 08/11/2024 1111 Last data filed at 08/10/2024 1649 Gross per 24 hour  Intake --  Output 1000 ml  Net -1000 ml     Physical Exam  Awake Alert, No new F.N deficits, Normal affect [place].AT,PERRAL Supple Neck, No JVD,   Symmetrical Chest wall movement, Good air movement bilaterally, CTAB RRR,No Gallops,Rubs or new Murmurs,  +ve B.Sounds, Abd Soft, No tenderness,   Bilat BKA    Data Review:    Recent Labs  Lab 08/09/24 2250 08/10/24 1037 08/10/24 1427 08/11/24 0542  WBC 14.0*  --  14.2* 13.3*  HGB 13.3 12.9 12.7 11.9*  HCT 41.8 40.9 40.2 37.8  PLT 215  --  212 218  MCV 94.4  --  94.1 93.8  MCH 30.0  --  29.7 29.5  MCHC 31.8  --  31.6 31.5  RDW 14.0  --  14.4 14.5  LYMPHSABS 1.6  --   --  2.7  MONOABS 1.0  --   --  1.3*  EOSABS 0.3  --   --  0.3  BASOSABS 0.1  --   --  0.1    Recent Labs  Lab 08/09/24 2250 08/10/24 0651 08/10/24 1037 08/11/24 0542  NA 133* 132*  --  132*  K 5.3* 5.5*  --  4.2  CL 97* 97*  --  93*  CO2 23 23  --  26  ANIONGAP 13 12  --  13  GLUCOSE 172* 180*  --  125*  BUN 39* 41*  --  33*  CREATININE 7.38* 7.70*  --  6.94*  AST 17  --   --  14*  ALT 18  --   --  16  ALKPHOS 93  --   --  84  BILITOT 0.7  --   --  0.7  ALBUMIN  3.6  --   --  3.4*  CRP  --    --   --  2.6*  PROCALCITON  --   --   --  0.39  HGBA1C  --  8.0*  --   --   MG  --   --   --  2.3  PHOS  --   --  6.2*  --   CALCIUM  9.2 9.0  --  8.9      Recent Labs  Lab 08/09/24 2250 08/10/24 0651 08/11/24 0542  CRP  --   --  2.6*  PROCALCITON  --   --  0.39  HGBA1C  --  8.0*  --   MG  --   --  2.3  CALCIUM  9.2 9.0 8.9    --------------------------------------------------------------------------------------------------------------- Lab Results  Component Value Date   CHOL 110 10/08/2023   HDL 45 10/08/2023   LDLCALC 46 10/08/2023   TRIG 95 10/08/2023   CHOLHDL 2.4 10/08/2023    Lab Results  Component Value Date   HGBA1C 8.0 (H) 08/10/2024   No results for input(s): TSH, T4TOTAL, FREET4, T3FREE, THYROIDAB in the last 72 hours. No results for input(s): VITAMINB12, FOLATE, FERRITIN, TIBC, IRON , RETICCTPCT in the last 72 hours. ------------------------------------------------------------------------------------------------------------------ Cardiac Enzymes No results for input(s): CKMB, TROPONINI, MYOGLOBIN in the last 168 hours.  Invalid input(s): CK  Micro Results No results found for this or any previous visit (from the past 240 hours).  Radiology Report CT ABDOMEN PELVIS W CONTRAST Result Date: 08/10/2024 CLINICAL DATA:  upper abdominal pain that started today. Pt reports couple episodes of emesi EXAM: CT ABDOMEN AND PELVIS WITH CONTRAST TECHNIQUE: Multidetector CT imaging of the abdomen and pelvis was performed using the standard protocol following bolus administration of intravenous contrast. RADIATION DOSE REDUCTION: This exam was performed according to the departmental dose-optimization program which includes automated exposure control, adjustment of the mA and/or kV according to patient size and/or use of iterative reconstruction technique. CONTRAST:  75mL OMNIPAQUE  IOHEXOL  350 MG/ML SOLN COMPARISON:  CT abdomen pelvis 05/30/2022  FINDINGS: Lower chest: Partially visualized central venous catheter with tip terminating within the right atrium. Tiny hiatal hernia. Hepatobiliary: No focal liver abnormality. Status post cholecystectomy. No biliary dilatation. Pancreas: No focal lesion. Normal pancreatic contour. No surrounding inflammatory changes. No main pancreatic ductal dilatation. Spleen: Normal in size without focal abnormality. Adrenals/Urinary Tract: No adrenal nodule bilaterally. Bilateral kidneys  enhance symmetrically. No hydronephrosis. No hydroureter. The urinary bladder is unremarkable. Stomach/Bowel: Stomach is within normal limits. No evidence of small bowel wall thickening or dilatation. Few scattered small and large bowel diverticula. Descending colon bowel wall thickening and pericolonic fat stranding. Appendix appears normal. Vascular/Lymphatic: No abdominal aorta or iliac aneurysm. Mild atherosclerotic plaque of the aorta and its branches. No abdominal, pelvic, or inguinal lymphadenopathy. Reproductive: Status post hysterectomy. No adnexal masses. Other: No intraperitoneal free fluid. No intraperitoneal free gas. No organized fluid collection. Musculoskeletal: No abdominal wall hernia or abnormality. No suspicious lytic or blastic osseous lesions. No acute displaced fracture. IMPRESSION: 1. Descending colitis. 2. Few scattered small and large bowel diverticula. No acute diverticulitis. 3. Tiny hiatal hernia. Electronically Signed   By: Morgane  Naveau M.D.   On: 08/10/2024 01:16     Signature  -   Lavada Stank M.D on 08/11/2024 at 11:11 AM   -  To page go to [email or web address]

## 2024-08-11 NOTE — Progress Notes (Addendum)
 Patient ID: Nancy [name], female   DOB: [birth year], 63 y.o.   MRN: [id number]    Progress Note   Subjective   Day # 2 CC; abdominal pain, nausea vomiting, diarrhea, hematochezia with left-sided colitis on CT  IV Cipro  and metronidazole   GI path panel and C. difficile still pending-not collected WBC 13.3/hemoglobin 11.9/hematocrit 37.8  Says she feels a lot better today nausea and vomiting have stopped, she has not had any diarrhea but has had a few more episodes of small-volume hematochezia.  Says she had a panic attack last night and required Xanax  and would like to try some food today   Objective   Vital signs in last 24 hours: Temp:  [97.4 F (36.3 C)-98.6 F (37 C)] 97.6 F (36.4 C) (08/26 0803) Pulse Rate:  [73-88] 76 (08/26 0400) Resp:  [10-24] 11 (08/26 0400) BP: (105-162)/(56-117) 127/63 (08/26 0803) SpO2:  [92 %-100 %] 99 % (08/26 0400) Weight:  [108.6 kg] 108.6 kg (08/26 0500)   General:    Older African-American female in NAD Heart:  Regular rate and rhythm; no murmurs Lungs: Respirations even and unlabored, lungs CTA bilaterally Abdomen:  Soft, obese, minimally tender in the left lower quadrant, no guarding or rebound normal bowel sounds. Extremities:  post bilateral BKA's Neurologic:  Alert and oriented,  grossly normal neurologically. Psych:  Cooperative. Normal mood and affect.  Intake/Output from previous day: 08/25 0701 - 08/26 0700 In: -  Out: 1000  Intake/Output this shift: No intake/output data recorded.  Lab Results: Recent Labs    08/09/24 2250 08/10/24 1037 08/10/24 1427 08/11/24 0542  WBC 14.0*  --  14.2* 13.3*  HGB 13.3 12.9 12.7 11.9*  HCT 41.8 40.9 40.2 37.8  PLT 215  --  212 218   BMET Recent Labs    08/09/24 2250 08/10/24 0651 08/11/24 0542  NA 133* 132* 132*  K 5.3* 5.5* 4.2  CL 97* 97* 93*  CO2 23 23 26   GLUCOSE 172* 180* 125*  BUN 39* 41* 33*  CREATININE 7.38* 7.70* 6.94*  CALCIUM  9.2 9.0 8.9   LFT Recent  Labs    08/11/24 0542  PROT 7.1  ALBUMIN  3.4*  AST 14*  ALT 16  ALKPHOS 84  BILITOT 0.7   PT/INR No results for input(s): LABPROT, INR in the last 72 hours.  Studies/Results: CT ABDOMEN PELVIS W CONTRAST Result Date: 08/10/2024 CLINICAL DATA:  upper abdominal pain that started today. Pt reports couple episodes of emesi EXAM: CT ABDOMEN AND PELVIS WITH CONTRAST TECHNIQUE: Multidetector CT imaging of the abdomen and pelvis was performed using the standard protocol following bolus administration of intravenous contrast. RADIATION DOSE REDUCTION: This exam was performed according to the departmental dose-optimization program which includes automated exposure control, adjustment of the mA and/or kV according to patient size and/or use of iterative reconstruction technique. CONTRAST:  75mL OMNIPAQUE  IOHEXOL  350 MG/ML SOLN COMPARISON:  CT abdomen pelvis 05/30/2022 FINDINGS: Lower chest: Partially visualized central venous catheter with tip terminating within the right atrium. Tiny hiatal hernia. Hepatobiliary: No focal liver abnormality. Status post cholecystectomy. No biliary dilatation. Pancreas: No focal lesion. Normal pancreatic contour. No surrounding inflammatory changes. No main pancreatic ductal dilatation. Spleen: Normal in size without focal abnormality. Adrenals/Urinary Tract: No adrenal nodule bilaterally. Bilateral kidneys enhance symmetrically. No hydronephrosis. No hydroureter. The urinary bladder is unremarkable. Stomach/Bowel: Stomach is within normal limits. No evidence of small bowel wall thickening or dilatation. Few scattered small and large bowel diverticula. Descending colon bowel wall thickening and  pericolonic fat stranding. Appendix appears normal. Vascular/Lymphatic: No abdominal aorta or iliac aneurysm. Mild atherosclerotic plaque of the aorta and its branches. No abdominal, pelvic, or inguinal lymphadenopathy. Reproductive: Status post hysterectomy. No adnexal masses. Other:  No intraperitoneal free fluid. No intraperitoneal free gas. No organized fluid collection. Musculoskeletal: No abdominal wall hernia or abnormality. No suspicious lytic or blastic osseous lesions. No acute displaced fracture. IMPRESSION: 1. Descending colitis. 2. Few scattered small and large bowel diverticula. No acute diverticulitis. 3. Tiny hiatal hernia. Electronically Signed   By: Morgane  Naveau M.D.   On: 08/10/2024 01:16       Assessment / Plan:    #58 63 year old female with end-stage renal disease on dialysis who came to the ER with acute onset on Sunday with abdominal pain initially more right-sided then generalized, diarrhea nausea vomiting dry heaves and eventual hematochezia.  Workup pertinent for leukocytosis and normal hemoglobin CT abdomen and pelvis with contrast showed a colitis involving the descending colon.  Unclear at this time whether this episode was of infectious etiology/self-limited versus left-sided segmental ischemic colitis  Symptoms much improved today, nausea and vomiting have resolved, diarrhea resolved and unable to collect stool specimens to date. On empiric Cipro  and metronidazole   #2 history of CVA #3 peripheral vascular disease status post bilateral BKA's #4.  Insulin -dependent diabetes mellitus #5.  History of chronic constipation #6.  History of chronic nausea, intermittent vomiting suspected secondary to diabetic gastroparesis, has not been on any medications due to history of prolonged QT  Plan; advance to soft diet today We will discontinue IV antibiotics today, as symptoms much improved / resolved Can arrange for outpatient follow-up with GI and patient should be considered for a trial of Motegrity as an outpatient for chronic constipation and gastroparesis..    Principal Problem:   Acute colitis Active Problems:   Rectal bleeding   Abnormal CT scan, gastrointestinal tract     LOS: 1 day   Amy Esterwood PA-C 08/11/2024, 9:56 AM      ATTENDING ADDENDUM: She is doing much better today - abdominal pain has mostly resolved. No further diarrhea, stool studies not sent. She is eating, feels well. Given her course to date I suspect she more than likely had ischemic colitis, she has multiple risk factors for this. If she does have more diarrhea please send stool studies, otherwise continue supportive measures. If she continues to do well can discharge later today or tomorrow. Avoid constipation, continue Miralax  and consideration for trial of Motegrity as outpatient. Recommend follow up colonoscopy in next few months once she has recovered from this acute issue. We will coordinate outpatient follow up.  We will sign off for now, please call with questions / concerns in the interim.  Marcey Naval, MD [facility]

## 2024-08-11 NOTE — Plan of Care (Signed)

## 2024-08-11 NOTE — Progress Notes (Addendum)
 Nancy [name] is an 63 y.o. female with carotid artery stenosis, CASGD, DM HTN, HLD, diabetic gastroparesis, PAD, CVA; dialyzes MWF at [facility] with last treatment on 8/22 for full treatment of 4 hours of 50 minutes leaving at 107.2 kg, her EDW is 106.7 kg and she has reached in the past 3 treatments. P/W abdominal pain, vomiting and diarrhea with bright red blood per rectum as well.  Has had chronic abd pain.   HD Orders ([facility]) MWF [facility] 4h40min EDW 106.7kg (last HD 8/22 107.2kg) CVC, no active Mircera (last 50mcg given 7/30) Calcitriol  2mcg PO TID Sensipar  30mg  three times per week Heparin  5000 units  Assessment/Plan: ESRD -Patient is a very compliant patient and last treatment was on Friday for a full treatment leaving very close to her dry weight.  She has reached her dry weight at least twice in the past 3 treatments.   - Tolerated dialysis on Monday with 2 L net UF; continue MWF regimen   Additional recommendations - Dose all meds for creatinine clearance < 10 ml/min  - Unless absolutely necessary, no MRIs with gadolinium.  - Prefer needle sticks in the dorsum of the hands or wrists.  No blood pressure measurements in right arm. - If blood transfusion is requested during hemodialysis sessions, please alert us  prior to the session.  - If a hemodialysis catheter line culture is requested, please alert us  as only hemodialysis nurses are able to collect those specimens.    Avoid nephrotoxic medications including NSAIDs and iodinated intravenous contrast exposure unless the latter is absolutely indicated.  Preferred narcotic agents for pain control are hydromorphone , fentanyl , and methadone. Morphine  should not be used. Avoid Baclofen and avoid oral sodium phosphate  and magnesium  citrate based laxatives / bowel preps. Continue strict Input and Output monitoring. Will monitor the patient closely with you and intervene or adjust therapy as indicated by changes in clinical status/labs    Renal  osteodystrophy - phosphorus elevated; restart renvela  at 3 tabs TIDM.  If she is still here we will recheck a phosphorus level in a few days.   Anemia -Last Mircera was given 7/30 and she has been taken off protocol as her hemoglobin has been above 12. Colitis w/ diarrhea -seen by GI, C. difficile and GI pathogen with eventual colonoscopy recommended DM - per primary HTN -start home regiment.  Ultrafiltration will help as well.  Subjective: She feels better, denies any more hematochezia but has not have a bowel movement.  Tolerated dialysis yesterday except for feeling hot.  No cramping.  Denies fever chills nausea vomiting.    Chemistry and CBC: Creat  Date/Time Value Ref Range Status  08/17/2014 04:41 PM 0.73 0.50 - 1.10 mg/dL Final   Creatinine, Ser  Date/Time Value Ref Range Status  08/11/2024 05:42 AM 6.94 (H) 0.44 - 1.00 mg/dL Final  91/74/7974 93:48 AM 7.70 (H) 0.44 - 1.00 mg/dL Final  91/75/7974 89:49 PM 7.38 (H) 0.44 - 1.00 mg/dL Final  98/92/7974 93:92 AM 5.10 (H) 0.44 - 1.00 mg/dL Final  91/92/7975 87:75 PM 6.57 (H) 0.44 - 1.00 mg/dL Final  95/92/7975 96:71 AM 6.07 (H) 0.44 - 1.00 mg/dL Final  95/93/7975 98:72 AM 6.80 (H) 0.44 - 1.00 mg/dL Final  95/94/7975 95:98 PM 6.23 (H) 0.44 - 1.00 mg/dL Final  96/74/7975 96:56 AM 7.39 (H) 0.44 - 1.00 mg/dL Final  97/78/7975 93:99 AM 5.10 (H) 0.44 - 1.00 mg/dL Final  97/78/7975 94:89 AM 4.72 (H) 0.44 - 1.00 mg/dL Final  87/93/7976 98:96 AM  5.57 (H) 0.44 - 1.00 mg/dL Final  87/94/7976 98:60 AM 4.31 (H) 0.44 - 1.00 mg/dL Final  87/95/7976 91:42 PM 4.04 (H) 0.44 - 1.00 mg/dL Final  87/95/7976 93:54 AM 3.33 (H) 0.44 - 1.00 mg/dL Final  87/96/7976 88:83 AM 4.69 (H) 0.44 - 1.00 mg/dL Final  88/79/7976 98:90 AM 4.66 (H) 0.44 - 1.00 mg/dL Final  88/80/7976 96:42 AM 6.89 (H) 0.44 - 1.00 mg/dL Final  88/80/7976 98:82 AM 6.87 (H) 0.44 - 1.00 mg/dL Final  89/89/7976 88:47 AM 4.50 (H) 0.44 - 1.00 mg/dL Final  91/77/7976 91:51 AM 3.40 (H) 0.44 -  1.00 mg/dL Final  93/81/7976 89:59 AM 3.34 (H) 0.44 - 1.00 mg/dL Final  93/85/7976 96:41 AM 2.58 (H) 0.44 - 1.00 mg/dL Final  93/86/7976 91:66 PM 2.42 (H) 0.44 - 1.00 mg/dL Final  95/75/7976 92:75 AM 5.20 (H) 0.44 - 1.00 mg/dL Final  95/78/7976 98:54 AM 5.62 (H) 0.44 - 1.00 mg/dL Final  95/79/7976 98:63 AM 4.54 (H) 0.44 - 1.00 mg/dL Final    Comment:    DELTA CHECK NOTED  04/04/2022 01:36 AM 2.95 (H) 0.44 - 1.00 mg/dL Final  95/81/7976 89:56 AM 3.88 (H) 0.44 - 1.00 mg/dL Final  95/84/7976 87:41 AM 3.22 (H) 0.44 - 1.00 mg/dL Final  95/85/7976 96:69 AM 4.15 (H) 0.44 - 1.00 mg/dL Final  95/86/7976 93:60 PM 3.95 (H) 0.44 - 1.00 mg/dL Final  95/97/7976 95:65 AM 4.60 (H) 0.44 - 1.00 mg/dL Final  96/92/7976 87:83 PM 5.54 (H) 0.44 - 1.00 mg/dL Final  96/93/7976 90:50 AM 5.20 (H) 0.44 - 1.00 mg/dL Final  96/95/7976 92:50 AM 5.03 (H) 0.44 - 1.00 mg/dL Final  96/97/7976 92:55 PM 6.17 (H) 0.44 - 1.00 mg/dL Final  96/97/7976 91:61 AM 7.30 (H) 0.44 - 1.00 mg/dL Final  96/98/7976 95:60 PM 6.20 (H) 0.44 - 1.00 mg/dL Final  96/98/7976 96:99 PM 6.03 (H) 0.44 - 1.00 mg/dL Final  97/78/7976 88:41 AM 5.97 (H) 0.44 - 1.00 mg/dL Final  97/89/7976 87:44 PM 5.59 (H) 0.44 - 1.00 mg/dL Final  97/96/7976 87:64 PM 5.31 (H) 0.44 - 1.00 mg/dL Final  98/79/7976 87:53 PM 4.76 (H) 0.44 - 1.00 mg/dL Final  98/86/7976 97:74 PM 4.27 (H) 0.44 - 1.00 mg/dL Final  98/92/7976 89:99 AM 4.42 (H) 0.44 - 1.00 mg/dL Final  98/92/7976 94:48 AM 4.21 (H) 0.44 - 1.00 mg/dL Final  98/93/7976 88:60 AM 4.20 (H) 0.44 - 1.00 mg/dL Final  87/70/7977 87:66 PM 4.01 (H) 0.44 - 1.00 mg/dL Final  88/71/7977 88:49 AM 3.52 (H) 0.44 - 1.00 mg/dL Final  88/81/7977 88:58 AM 3.54 (H) 0.44 - 1.00 mg/dL Final  88/83/7977 97:61 AM 3.43 (H) 0.44 - 1.00 mg/dL Final   Recent Labs  Lab 08/09/24 2250 08/10/24 0651 08/10/24 1037 08/11/24 0542  NA 133* 132*  --  132*  K 5.3* 5.5*  --  4.2  CL 97* 97*  --  93*  CO2 23 23  --  26  GLUCOSE 172* 180*   --  125*  BUN 39* 41*  --  33*  CREATININE 7.38* 7.70*  --  6.94*  CALCIUM  9.2 9.0  --  8.9  PHOS  --   --  6.2*  --    Recent Labs  Lab 08/09/24 2250 08/10/24 1037 08/10/24 1427 08/11/24 0542  WBC 14.0*  --  14.2* 13.3*  NEUTROABS 11.0*  --   --  8.9*  HGB 13.3 12.9 12.7 11.9*  HCT 41.8 40.9 40.2 37.8  MCV 94.4  --  94.1  93.8  PLT 215  --  212 218   Liver Function Tests: Recent Labs  Lab 08/09/24 2250 08/11/24 0542  AST 17 14*  ALT 18 16  ALKPHOS 93 84  BILITOT 0.7 0.7  PROT 7.5 7.1  ALBUMIN  3.6 3.4*   Recent Labs  Lab 08/09/24 2250  LIPASE 28   No results for input(s): AMMONIA in the last 168 hours. Cardiac Enzymes: No results for input(s): CKTOTAL, CKMB, CKMBINDEX, TROPONINI in the last 168 hours. Iron  Studies: No results for input(s): IRON , TIBC, TRANSFERRIN, FERRITIN in the last 72 hours. PT/INR: @LABRCNTIP (inr:5)  Xrays/Other Studies: ) Results for orders placed or performed during the hospital encounter of 08/09/24 (from the past 48 hours)  CBG monitoring, ED     Status: Abnormal   Collection Time: 08/09/24 10:49 PM  Result Value Ref Range   Glucose-Capillary 145 (H) 70 - 99 mg/dL    Comment: Glucose reference range applies only to samples taken after fasting for at least 8 hours.  CBC with Differential     Status: Abnormal   Collection Time: 08/09/24 10:50 PM  Result Value Ref Range   WBC 14.0 (H) 4.0 - 10.5 K/uL   RBC 4.43 3.87 - 5.11 MIL/uL   Hemoglobin 13.3 12.0 - 15.0 g/dL   HCT 58.1 63.9 - 53.9 %   MCV 94.4 80.0 - 100.0 fL   MCH 30.0 26.0 - 34.0 pg   MCHC 31.8 30.0 - 36.0 g/dL   RDW 85.9 88.4 - 84.4 %   Platelets 215 150 - 400 K/uL   nRBC 0.0 0.0 - 0.2 %   Neutrophils Relative % 78 %   Neutro Abs 11.0 (H) 1.7 - 7.7 K/uL   Lymphocytes Relative 12 %   Lymphs Abs 1.6 0.7 - 4.0 K/uL   Monocytes Relative 7 %   Monocytes Absolute 1.0 0.1 - 1.0 K/uL   Eosinophils Relative 2 %   Eosinophils Absolute 0.3 0.0 - 0.5 K/uL    Basophils Relative 1 %   Basophils Absolute 0.1 0.0 - 0.1 K/uL   Immature Granulocytes 0 %   Abs Immature Granulocytes 0.05 0.00 - 0.07 K/uL    Comment: Performed at [facility], [address] [place], [place] [address]  Comprehensive metabolic panel     Status: Abnormal   Collection Time: 08/09/24 10:50 PM  Result Value Ref Range   Sodium 133 (L) 135 - 145 mmol/L   Potassium 5.3 (H) 3.5 - 5.1 mmol/L   Chloride 97 (L) 98 - 111 mmol/L   CO2 23 22 - 32 mmol/L   Glucose, Bld 172 (H) 70 - 99 mg/dL    Comment: Glucose reference range applies only to samples taken after fasting for at least 8 hours.   BUN 39 (H) 8 - 23 mg/dL   Creatinine, Ser 2.61 (H) 0.44 - 1.00 mg/dL   Calcium  9.2 8.9 - 10.3 mg/dL   Total Protein 7.5 6.5 - 8.1 g/dL   Albumin  3.6 3.5 - 5.0 g/dL   AST 17 15 - 41 U/L   ALT 18 0 - 44 U/L   Alkaline Phosphatase 93 38 - 126 U/L   Total Bilirubin 0.7 0.0 - 1.2 mg/dL   GFR, Estimated 6 (L) >60 mL/min    Comment: (NOTE) Calculated using the CKD-EPI Creatinine Equation (2021)    Anion gap 13 5 - 15    Comment: Performed at [facility], [address], [place], [place] [address]  Lipase, blood  Status: None   Collection Time: 08/09/24 10:50 PM  Result Value Ref Range   Lipase 28 11 - 51 U/L    Comment: Performed at [facility], [address], [place], [place] [address]  Hepatitis B surface antigen     Status: None   Collection Time: 08/10/24  6:50 AM  Result Value Ref Range   Hepatitis B Surface Ag NON REACTIVE NON REACTIVE    Comment: Performed at [facility], [address], [place], [place] [address]  Basic metabolic panel     Status: Abnormal   Collection Time: 08/10/24  6:51 AM  Result Value Ref Range   Sodium 132 (L) 135 - 145 mmol/L   Potassium 5.5 (H) 3.5 - 5.1 mmol/L   Chloride 97 (L) 98 - 111 mmol/L   CO2 23 22 - 32 mmol/L   Glucose, Bld 180 (H) 70 - 99 mg/dL    Comment: Glucose reference range applies only to samples taken  after fasting for at least 8 hours.   BUN 41 (H) 8 - 23 mg/dL   Creatinine, Ser 2.29 (H) 0.44 - 1.00 mg/dL   Calcium  9.0 8.9 - 10.3 mg/dL   GFR, Estimated 5 (L) >60 mL/min    Comment: (NOTE) Calculated using the CKD-EPI Creatinine Equation (2021)    Anion gap 12 5 - 15    Comment: Performed at [facility], [address] [place], [place] [address]  HIV Antibody (routine testing w rflx)     Status: None   Collection Time: 08/10/24  6:51 AM  Result Value Ref Range   HIV Screen 4th Generation wRfx Non Reactive Non Reactive    Comment: Performed at [facility], [address] [place], [place] [address]  Hemoglobin A1c     Status: Abnormal   Collection Time: 08/10/24  6:51 AM  Result Value Ref Range   Hgb A1c MFr Bld 8.0 (H) 4.8 - 5.6 %    Comment: (NOTE) Diagnosis of Diabetes The following HbA1c ranges recommended by the American Diabetes Association (ADA) may be used as an aid in the diagnosis of diabetes mellitus.  Hemoglobin             Suggested A1C NGSP%              Diagnosis  <5.7                   Non Diabetic  5.7-6.4                Pre-Diabetic  >6.4                   Diabetic  <7.0                   Glycemic control for                       adults with diabetes.     Mean Plasma Glucose 182.9 mg/dL    Comment: Performed at [facility], [address], [place], [place] [address]  CBG monitoring, ED     Status: Abnormal   Collection Time: 08/10/24  7:36 AM  Result Value Ref Range   Glucose-Capillary 175 (H) 70 - 99 mg/dL    Comment: Glucose reference range applies only to samples taken after fasting for at least 8 hours.  Phosphorus     Status: Abnormal   Collection Time: 08/10/24  10:37 AM  Result Value Ref Range   Phosphorus 6.2 (H) 2.5 - 4.6 mg/dL    Comment: Performed at [facility], [address] [place], [place] [address]  Hemoglobin and hematocrit, blood     Status: None   Collection Time: 08/10/24 10:37 AM  Result Value  Ref Range   Hemoglobin 12.9 12.0 - 15.0 g/dL   HCT 59.0 63.9 - 53.9 %    Comment: Performed at [facility], [address], [place], [place] [address]  CBG monitoring, ED     Status: Abnormal   Collection Time: 08/10/24 12:45 PM  Result Value Ref Range   Glucose-Capillary 147 (H) 70 - 99 mg/dL    Comment: Glucose reference range applies only to samples taken after fasting for at least 8 hours.  CBC     Status: Abnormal   Collection Time: 08/10/24  2:27 PM  Result Value Ref Range   WBC 14.2 (H) 4.0 - 10.5 K/uL   RBC 4.27 3.87 - 5.11 MIL/uL   Hemoglobin 12.7 12.0 - 15.0 g/dL   HCT 59.7 63.9 - 53.9 %   MCV 94.1 80.0 - 100.0 fL   MCH 29.7 26.0 - 34.0 pg   MCHC 31.6 30.0 - 36.0 g/dL   RDW 85.5 88.4 - 84.4 %   Platelets 212 150 - 400 K/uL   nRBC 0.0 0.0 - 0.2 %    Comment: Performed at [facility], [address] [place], [place] [address]  Glucose, capillary     Status: Abnormal   Collection Time: 08/10/24  8:37 PM  Result Value Ref Range   Glucose-Capillary 194 (H) 70 - 99 mg/dL    Comment: Glucose reference range applies only to samples taken after fasting for at least 8 hours.  Glucose, capillary     Status: Abnormal   Collection Time: 08/10/24 11:34 PM  Result Value Ref Range   Glucose-Capillary 185 (H) 70 - 99 mg/dL    Comment: Glucose reference range applies only to samples taken after fasting for at least 8 hours.  Glucose, capillary     Status: Abnormal   Collection Time: 08/11/24  4:39 AM  Result Value Ref Range   Glucose-Capillary 121 (H) 70 - 99 mg/dL    Comment: Glucose reference range applies only to samples taken after fasting for at least 8 hours.  CBC with Differential/Platelet     Status: Abnormal   Collection Time: 08/11/24  5:42 AM  Result Value Ref Range   WBC 13.3 (H) 4.0 - 10.5 K/uL   RBC 4.03 3.87 - 5.11 MIL/uL   Hemoglobin 11.9 (L) 12.0 - 15.0 g/dL   HCT 62.1 63.9 - 53.9 %   MCV 93.8 80.0 - 100.0 fL   MCH 29.5 26.0 - 34.0 pg   MCHC 31.5  30.0 - 36.0 g/dL   RDW 85.4 88.4 - 84.4 %   Platelets 218 150 - 400 K/uL   nRBC 0.0 0.0 - 0.2 %   Neutrophils Relative % 67 %   Neutro Abs 8.9 (H) 1.7 - 7.7 K/uL   Lymphocytes Relative 20 %   Lymphs Abs 2.7 0.7 - 4.0 K/uL   Monocytes Relative 10 %   Monocytes Absolute 1.3 (H) 0.1 - 1.0 K/uL   Eosinophils Relative 2 %   Eosinophils Absolute 0.3 0.0 - 0.5 K/uL   Basophils Relative 1 %   Basophils Absolute 0.1 0.0 - 0.1 K/uL   Immature Granulocytes 0 %  Abs Immature Granulocytes 0.05 0.00 - 0.07 K/uL    Comment: Performed at [facility], [address] [place], [place] [address]  Comprehensive metabolic panel with GFR     Status: Abnormal   Collection Time: 08/11/24  5:42 AM  Result Value Ref Range   Sodium 132 (L) 135 - 145 mmol/L   Potassium 4.2 3.5 - 5.1 mmol/L   Chloride 93 (L) 98 - 111 mmol/L   CO2 26 22 - 32 mmol/L   Glucose, Bld 125 (H) 70 - 99 mg/dL    Comment: Glucose reference range applies only to samples taken after fasting for at least 8 hours.   BUN 33 (H) 8 - 23 mg/dL   Creatinine, Ser 3.05 (H) 0.44 - 1.00 mg/dL   Calcium  8.9 8.9 - 10.3 mg/dL   Total Protein 7.1 6.5 - 8.1 g/dL   Albumin  3.4 (L) 3.5 - 5.0 g/dL   AST 14 (L) 15 - 41 U/L   ALT 16 0 - 44 U/L   Alkaline Phosphatase 84 38 - 126 U/L   Total Bilirubin 0.7 0.0 - 1.2 mg/dL   GFR, Estimated 6 (L) >60 mL/min    Comment: (NOTE) Calculated using the CKD-EPI Creatinine Equation (2021)    Anion gap 13 5 - 15    Comment: Performed at [facility], [address], [place], [place] [address]  C-reactive protein     Status: Abnormal   Collection Time: 08/11/24  5:42 AM  Result Value Ref Range   CRP 2.6 (H) <1.0 mg/dL    Comment: Performed at [facility], [address] [place], [place] [address]  Magnesium      Status: None   Collection Time: 08/11/24  5:42 AM  Result Value Ref Range   Magnesium  2.3 1.7 - 2.4 mg/dL    Comment: Performed at [facility], [address]  [place], [place] [address]   CT ABDOMEN PELVIS W CONTRAST Result Date: 08/10/2024 CLINICAL DATA:  upper abdominal pain that started today. Pt reports couple episodes of emesi EXAM: CT ABDOMEN AND PELVIS WITH CONTRAST TECHNIQUE: Multidetector CT imaging of the abdomen and pelvis was performed using the standard protocol following bolus administration of intravenous contrast. RADIATION DOSE REDUCTION: This exam was performed according to the departmental dose-optimization program which includes automated exposure control, adjustment of the mA and/or kV according to patient size and/or use of iterative reconstruction technique. CONTRAST:  75mL OMNIPAQUE  IOHEXOL  350 MG/ML SOLN COMPARISON:  CT abdomen pelvis 05/30/2022 FINDINGS: Lower chest: Partially visualized central venous catheter with tip terminating within the right atrium. Tiny hiatal hernia. Hepatobiliary: No focal liver abnormality. Status post cholecystectomy. No biliary dilatation. Pancreas: No focal lesion. Normal pancreatic contour. No surrounding inflammatory changes. No main pancreatic ductal dilatation. Spleen: Normal in size without focal abnormality. Adrenals/Urinary Tract: No adrenal nodule bilaterally. Bilateral kidneys enhance symmetrically. No hydronephrosis. No hydroureter. The urinary bladder is unremarkable. Stomach/Bowel: Stomach is within normal limits. No evidence of small bowel wall thickening or dilatation. Few scattered small and large bowel diverticula. Descending colon bowel wall thickening and pericolonic fat stranding. Appendix appears normal. Vascular/Lymphatic: No abdominal aorta or iliac aneurysm. Mild atherosclerotic plaque of the aorta and its branches. No abdominal, pelvic, or inguinal lymphadenopathy. Reproductive: Status post hysterectomy. No adnexal masses. Other: No intraperitoneal free fluid. No intraperitoneal free gas. No organized fluid collection. Musculoskeletal: No abdominal wall hernia or abnormality. No suspicious lytic  or blastic osseous lesions. No acute displaced fracture. IMPRESSION:  1. Descending colitis. 2. Few scattered small and large bowel diverticula. No acute diverticulitis. 3. Tiny hiatal hernia. Electronically Signed   By: Morgane  Naveau M.D.   On: 08/10/2024 01:16    PMH:   Past Medical History:  Diagnosis Date   Anemia 2006   Anginal pain (HCC)    Carotid artery disease (HCC)    right ICA occlusion, 40-59% LICA 02/2021 US    CHF (congestive heart failure) (HCC)    Coronary artery disease    Depression 2014   previously on amitryptiline    Diabetes mellitus with neurological manifestation (HCC) 2006   Diabetic peripheral neuropathy (HCC) 04/26/2020   Dysphagia    Dyspnea    ESRD on hemodialysis (HCC)    Fracture of left ankle 1997   Gastroparesis 07/2016   Heart murmur    HOH (hard of hearing) 2004   Hyperlipidemia 2006   Hypertension 2006   IBS (irritable bowel syndrome) 2002   Leukopenia 2015   Macular degeneration 11/2019   Peripheral vascular disease (HCC)    Shingles 2009   Stroke [facility]) 2020   Thyroid  nodule 2004    PSH:   Past Surgical History:  Procedure Laterality Date   A/V FISTULAGRAM Right 09/25/2022   Procedure: A/V Fistulagram;  Surgeon: Serene Gaile ORN, MD;  Location: [facility] INVASIVE CV LAB;  Service: Cardiovascular;  Laterality: Right;   ABDOMINAL HYSTERECTOMY  2005   AMPUTATION Bilateral 04/04/2022   Procedure: BILATERAL BELOW KNEE AMPUTATION;  Surgeon: Harden Jerona GAILS, MD;  Location: [facility] OR;  Service: Orthopedics;  Laterality: Bilateral;   AV FISTULA PLACEMENT Right 12/22/2021   Procedure: RIGHT ARM ARTERIOVENOUS (AV) BRACIOCPHELAIC FISTULA CREATION;  Surgeon: Sheree Penne Bruckner, MD;  Location: [facility] OR;  Service: Vascular;  Laterality: Right;   AV FISTULA PLACEMENT Right 02/19/2022   Procedure: RIGHT ARTERIOVENOUS FISTULA;  Surgeon: Eliza Bruckner RAMAN, MD;  Location: [facility] OR;  Service: Vascular;  Laterality: Right;   AV FISTULA PLACEMENT Left 12/24/2023    Procedure: LEFT BRACHIOCEPHALIC ARTERIOVENOUS (AV) FISTULA CREATION;  Surgeon: Lanis Fonda BRAVO, MD;  Location: [facility] OR;  Service: Vascular;  Laterality: Left;   BASCILIC VEIN TRANSPOSITION Right 08/07/2022   Procedure: SECOND STAGE RIGHT BASILIC VEIN TRANSPOSITION;  Surgeon: Eliza Bruckner RAMAN, MD;  Location: [facility] OR;  Service: Vascular;  Laterality: Right;   CATARACT EXTRACTION Left 11/2019   CESAREAN SECTION  1983   CHOLECYSTECTOMY N/A 03/05/2020   Procedure: LAPAROSCOPIC CHOLECYSTECTOMY WITH INTRAOPERATIVE CHOLANGIOGRAM;  Surgeon: Belinda Cough, MD;  Location: [facility] ORS;  Service: General;  Laterality: N/A;   COLONOSCOPY     [facility] said no polyps were found   ESOPHAGOGASTRODUODENOSCOPY (EGD) WITH PROPOFOL  Left 08/26/2014   Procedure: ESOPHAGOGASTRODUODENOSCOPY (EGD) WITH PROPOFOL ;  Surgeon: Elsie Cree, MD;  Location: [facility] ENDOSCOPY;  Service: Endoscopy;  Laterality: Left;   ESOPHAGOGASTRODUODENOSCOPY (EGD) WITH PROPOFOL  N/A 03/03/2020   Procedure: ESOPHAGOGASTRODUODENOSCOPY (EGD) WITH PROPOFOL ;  Surgeon: Albertus Gordy HERO, MD;  Location: [facility] ENDOSCOPY;  Service: Gastroenterology;  Laterality: N/A;   IR FLUORO GUIDE CV LINE RIGHT  02/15/2022   IR US  GUIDE VASC ACCESS RIGHT  02/15/2022   PERIPHERAL VASCULAR BALLOON ANGIOPLASTY Right 09/25/2022   Procedure: PERIPHERAL VASCULAR BALLOON ANGIOPLASTY;  Surgeon: Serene Gaile ORN, MD;  Location: [facility];  Service: Cardiovascular;  Laterality: Right;  AVF   RIGHT HEART CATH N/A 07/14/2021   Procedure: RIGHT HEART CATH;  Surgeon: Rolan Ezra RAMAN, MD;  Location: [facility] INVASIVE CV LAB;  Service: Cardiovascular;  Laterality: N/A;   RIGHT/LEFT  HEART CATH AND CORONARY ANGIOGRAPHY N/A 04/27/2021   Procedure: RIGHT/LEFT HEART CATH AND CORONARY ANGIOGRAPHY;  Surgeon: Court Dorn PARAS, MD;  Location: [facility];  Service: Cardiovascular;  Laterality: N/A;    Allergies:  Allergies  Allergen Reactions   Hydralazine  Other (See Comments)    Hydralazine  Hcl Other (See Comments)    Hair loss   Hydrocodone Itching and Other (See Comments)    Upset stomach   Metformin  And Related Nausea And Vomiting and Other (See Comments)    Stomach pains, also   Other Nausea Only and Other (See Comments)    Lettuce- Does not digest this!!   Plaquenil [Hydroxychloroquine Sulfate] Hives   Shellfish Allergy Nausea And Vomiting   Shellfish-Derived Products Nausea Only and Other (See Comments)    Caused an upset stomach   Shrimp (Diagnostic) Nausea Only and Other (See Comments)    Upset stomach    Sulfa Antibiotics Hives   Sulfur  Hives    Medications:   Prior to Admission medications   Medication Sig Start Date End Date Taking? Authorizing Provider  acetaminophen  (TYLENOL ) 500 MG tablet Take 1,000 mg by mouth every 8 (eight) hours as needed for mild pain (pain score 1-3), moderate pain (pain score 4-6) or headache.   Yes [provider]  albuterol  (VENTOLIN  HFA) 108 (90 Base) MCG/ACT inhaler Inhale 2 puffs into the lungs every 6 (six) hours as needed. 08/21/23  Yes [name], MD  ALPRAZolam  (XANAX ) 0.25 MG tablet Take 1 tablet (0.25 mg total) by mouth on Monday, Wednesday and Friday at hemodialysis. 06/10/24  Yes [name], MD  amLODipine  (NORVASC ) 10 MG tablet Take 1 tablet (10 mg total) by mouth daily for hypertension 06/04/24  Yes [name], MD  aspirin  81 MG EC tablet Take 1 tablet (81 mg total) by mouth daily. 09/26/21  Yes Milford, Harlene HERO, FNP  atorvastatin  (LIPITOR) 40 MG tablet Take 1 tablet (40 mg total) by mouth daily for cholesterol. 06/04/24  Yes [name], MD  ezetimibe  (ZETIA ) 10 MG tablet Take 1 tablet (10 mg total) by mouth daily for cholesterol. 06/04/24  Yes [name], MD  insulin  glargine (LANTUS  SOLOSTAR) 100 UNIT/ML Solostar Pen Inject 5 Units into the skin 2 (two) times daily. Increased by Provider on 6/24 06/16/24  Yes [name], MD  isosorbide  mononitrate (IMDUR ) 30  MG 24 hr tablet Take 2 tablets (60 mg total) by mouth daily for blood pressure. 06/04/24  Yes [name], MD  labetalol  (NORMODYNE ) 300 MG tablet Take 1 tablet (300 mg total) by mouth 2 (two) times daily. Patient taking differently: Take 300 mg by mouth daily. Take one tablet by mouth on Tues, Thurs, Saturday, and Sunday in the morning (non-dialysis days). Take one tablet by mouth on Mon, Wed,and Fri in the evening after dialysis. 06/04/24  Yes [name], MD  levothyroxine  (SYNTHROID ) 25 MCG tablet Take 1 tablet (25 mcg total) by mouth daily for low thyroid  hormone. 06/04/24  Yes [name], MD  Menthol, Topical Analgesic, (BIOFREEZE COOL THE PAIN) 4 % GEL Apply 1 Application topically as needed (Shoulder and knee pain.).   Yes [provider]  omeprazole  (PRILOSEC) 20 MG capsule Take 2 capsules (40 mg total) by mouth daily for acid reflux. 07/13/24  Yes [name], MD  ondansetron  (ZOFRAN ) 4 MG tablet Take 1 tablet (4 mg total) by mouth every 8 (eight) hours as needed for nausea. 04/29/24  Yes  polyethylene glycol powder (GLYCOLAX /MIRALAX ) 17 GM/SCOOP powder Mix 1 capful with 8 ounces of fluid and take by mouth daily as needed. 03/25/24  Yes [name], MD  sevelamer  carbonate (RENVELA ) 800 MG tablet Take 3 tablets (2,400 mg total) by mouth 3 (three) times daily with meals and 1 tablet with snacks. 09/04/23  Yes   Insulin  NPH Isophane & Regular (RELION 70/30 Ramona) Inject 35 Units into the skin 2 (two) times daily.  08/17/14  [provider]    Discontinued Meds:   Medications Discontinued During This Encounter  Medication Reason   lactated ringers  bolus 1,000 mL    vancomycin  (VANCOCIN ) capsule 125 mg    fentaNYL  (SUBLIMAZE ) injection 50 mcg    Blood Glucose Monitoring Suppl (ACCU-CHEK GUIDE) w/Device KIT Completed Course   Continuous Glucose Receiver (FREESTYLE LIBRE 3 READER) DEVI    Continuous Glucose Sensor (FREESTYLE LIBRE 3 PLUS SENSOR) MISC     Multiple Vitamin (MULTI-VITAMIN DAILY) TABS Patient Preference   Accu-Chek Softclix Lancets lancets    glucose blood (ACCU-CHEK GUIDE) test strip    Insulin  Pen Needle (PEN NEEDLES) 31G X 8 MM MISC    fentaNYL  (SUBLIMAZE ) injection 25-50 mcg    heparin  injection 1,000 Units Patient Transfer   insulin  glargine (LANTUS ) Solostar Pen 2 Units Duplicate    Social History:  reports that she has quit smoking. Her smoking use included cigarettes. She has a 0.1 pack-year smoking history. She has never been exposed to tobacco smoke. She has never used smokeless tobacco. She reports that she does not drink alcohol and does not use drugs.  Family History:   Family History  Problem Relation Age of Onset   Hypertension Mother    Heart disease Mother    Diabetes Mother    Thyroid  disease Mother    Congestive Heart Failure Mother    Breast cancer Maternal Grandmother    Colon cancer Maternal Grandfather    Heart attack Sister    Heart disease Brother    Hyperlipidemia Brother    Hypertension Brother    Diabetes Father    Breast cancer Maternal Aunt     Blood pressure (!) 113/56, pulse 76, temperature 98.6 F (37 C), temperature source Oral, resp. rate 11, weight 108.6 kg, SpO2 99%. Physical Exam: General appearance: NAD  Head: NCAT  Neck: trachea midline Back: No CVA tenderness. Resp: CTA  b/l Cardio: regular rate and rhythm GI: Normal tenderness, no rebound Extremities: Bilateral BKA with well-healed wounds, prosthetics off      Lidiya Reise, LYNWOOD ORN, MD 08/11/2024, 7:34 AM

## 2024-08-11 NOTE — Evaluation (Signed)
 Physical Therapy Evaluation Patient Details Name: Nancy [name] MRN: [id number] DOB: 1961/09/13 Today's Date: 08/11/2024  History of Present Illness  63 y.o. female presented to the ED with upper abdominal pain that started on Sunday. 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  Clinical Impression  Pt presents with admitting diagnosis above. Pt today was able to stand and take a few steps in room with RW +2 Min A. Pt reports at baseline she is mostly WC bound and requires assistance from her husband for transfers and all ADLs. Pt appears to be fairly close to baseline however noted with decreased activity tolerance. Recommend HHPT upon DC. PT will continue to follow.          If plan is discharge home, recommend the following: A lot of help with walking and/or transfers;A lot of help with bathing/dressing/bathroom;Assistance with cooking/housework;Direct supervision/assist for medications management;Assist for transportation;Help with stairs or ramp for entrance;Supervision due to cognitive status   Can travel by private vehicle        Equipment Recommendations None recommended by PT  Recommendations for Other Services  OT consult    Functional Status Assessment Patient has had a recent decline in their functional status and demonstrates the ability to make significant improvements in function in a reasonable and predictable amount of time.     Precautions / Restrictions Precautions Precautions: Fall Recall of Precautions/Restrictions: Intact Restrictions Weight Bearing Restrictions Per Provider Order: No      Mobility  Bed Mobility Overal bed mobility: Needs Assistance Bed Mobility: Supine to Sit, Sit to Supine     Supine to sit: Min assist Sit to supine: Min assist   General bed mobility comments: Min A to scoot hips to EOB.    Transfers Overall transfer level: Needs  assistance Equipment used: Rolling walker (2 wheels) Transfers: Sit to/from Stand Sit to Stand: +2 physical assistance, Min assist, From elevated surface           General transfer comment: +2 Min A to stand and take side steps along EOB.    Ambulation/Gait Ambulation/Gait assistance: +2 physical assistance, Min assist Gait Distance (Feet): 5 Feet Assistive device: Rolling walker (2 wheels) Gait Pattern/deviations: Knees buckling, Trunk flexed, Decreased stride length, Step-through pattern Gait velocity: decreased     General Gait Details: Able to take small steps forwards and backwards. L knee noted to buckle with pt requiring frequent cues for safety and proximity to RW.  Stairs            Wheelchair Mobility     Tilt Bed    Modified Rankin (Stroke Patients Only)       Balance Overall balance assessment: Needs assistance Sitting-balance support: Bilateral upper extremity supported, Feet supported Sitting balance-Leahy Scale: Fair     Standing balance support: Bilateral upper extremity supported, During functional activity Standing balance-Leahy Scale: Poor Standing balance comment: Reliant on RW                             Pertinent Vitals/Pain Pain Assessment Pain Assessment: No/denies pain    Home Living Family/patient expects to be discharged to:: Private residence Living Arrangements: Spouse/significant other Available Help at Discharge: Family;Available PRN/intermittently Type of Home: House Home Access: Ramped entrance       Home Layout: Multi-level;Able to live on main level with bedroom/bathroom Home Equipment: Rolling Walker (2 wheels);Shower seat;BSC/3in1;Wheelchair -  manual Additional Comments: Pt reports O2 as needed    Prior Function Prior Level of Function : Needs assist             Mobility Comments: Husband assists with transfers. Pt reports mostly transferring to [facility] however can walk across the room  occasionally. ADLs Comments: Pt reports needing assistance.     Extremity/Trunk Assessment   Upper Extremity Assessment Upper Extremity Assessment: Overall WFL for tasks assessed    Lower Extremity Assessment Lower Extremity Assessment: RLE deficits/detail;LLE deficits/detail RLE Deficits / Details: B BKA LLE Deficits / Details: B BKA    Cervical / Trunk Assessment Cervical / Trunk Assessment: Normal  Communication   Communication Communication: No apparent difficulties    Cognition Arousal: Alert Behavior During Therapy: WFL for tasks assessed/performed                           PT - Cognition Comments: Some slow processing noted. Following commands: Intact       Cueing Cueing Techniques: Verbal cues, Tactile cues     General Comments General comments (skin integrity, edema, etc.): VSS    Exercises     Assessment/Plan    PT Assessment Patient needs continued PT services  PT Problem List Decreased strength;Decreased activity tolerance;Decreased range of motion;Decreased mobility;Decreased balance;Decreased coordination;Decreased cognition;Decreased safety awareness;Decreased knowledge of use of DME;Decreased knowledge of precautions;Cardiopulmonary status limiting activity       PT Treatment Interventions DME instruction;Gait training;Stair training;Functional mobility training;Neuromuscular re-education;Therapeutic activities;Therapeutic exercise;Balance training;Cognitive remediation;Patient/family education    PT Goals (Current goals can be found in the Care Plan section)  Acute Rehab PT Goals Patient Stated Goal: to go home PT Goal Formulation: With patient Time For Goal Achievement: 08/25/24 Potential to Achieve Goals: Fair    Frequency Min 2X/week     Co-evaluation               AM-PAC PT 6 Clicks Mobility  Outcome Measure Help needed turning from your back to your side while in a flat bed without using bedrails?: A Little Help  needed moving from lying on your back to sitting on the side of a flat bed without using bedrails?: A Little Help needed moving to and from a bed to a chair (including a wheelchair)?: A Little Help needed standing up from a chair using your arms (e.g., wheelchair or bedside chair)?: A Lot Help needed to walk in hospital room?: A Lot Help needed climbing 3-5 steps with a railing? : Total 6 Click Score: 14    End of Session Equipment Utilized During Treatment: Gait belt Activity Tolerance: Patient tolerated treatment well Patient left: in bed;with call bell/phone within reach;with bed alarm set;with family/visitor present Nurse Communication: Mobility status PT Visit Diagnosis: Other abnormalities of gait and mobility (R26.89)    Time: 8576-8558 PT Time Calculation (min) (ACUTE ONLY): 18 min   Charges:   PT Evaluation $PT Eval Moderate Complexity: 1 Mod   PT General Charges $$ ACUTE PT VISIT: 1 Visit         Sueellen NOVAK, PT, DPT Acute Rehab Services [phone]   Aurelio Mccamy 08/11/2024, 5:08 PM

## 2024-08-12 ENCOUNTER — Other Ambulatory Visit (HOSPITAL_COMMUNITY): Payer: Self-pay

## 2024-08-12 DIAGNOSIS — K529 Noninfective gastroenteritis and colitis, unspecified: Secondary | ICD-10-CM | POA: Diagnosis not present

## 2024-08-12 DIAGNOSIS — K625 Hemorrhage of anus and rectum: Secondary | ICD-10-CM | POA: Diagnosis not present

## 2024-08-12 DIAGNOSIS — R933 Abnormal findings on diagnostic imaging of other parts of digestive tract: Secondary | ICD-10-CM | POA: Diagnosis not present

## 2024-08-12 LAB — GLUCOSE, CAPILLARY
Glucose-Capillary: 125 mg/dL — ABNORMAL HIGH (ref 70–99)
Glucose-Capillary: 160 mg/dL — ABNORMAL HIGH (ref 70–99)
Glucose-Capillary: 190 mg/dL — ABNORMAL HIGH (ref 70–99)
Glucose-Capillary: 233 mg/dL — ABNORMAL HIGH (ref 70–99)
Glucose-Capillary: 235 mg/dL — ABNORMAL HIGH (ref 70–99)
Glucose-Capillary: 248 mg/dL — ABNORMAL HIGH (ref 70–99)

## 2024-08-12 LAB — CBC WITH DIFFERENTIAL/PLATELET
Abs Immature Granulocytes: 0.04 K/uL (ref 0.00–0.07)
Basophils Absolute: 0.1 K/uL (ref 0.0–0.1)
Basophils Relative: 0 %
Eosinophils Absolute: 0.3 K/uL (ref 0.0–0.5)
Eosinophils Relative: 3 %
HCT: 37.9 % (ref 36.0–46.0)
Hemoglobin: 11.9 g/dL — ABNORMAL LOW (ref 12.0–15.0)
Immature Granulocytes: 0 %
Lymphocytes Relative: 16 %
Lymphs Abs: 1.8 K/uL (ref 0.7–4.0)
MCH: 29.6 pg (ref 26.0–34.0)
MCHC: 31.4 g/dL (ref 30.0–36.0)
MCV: 94.3 fL (ref 80.0–100.0)
Monocytes Absolute: 1.1 K/uL — ABNORMAL HIGH (ref 0.1–1.0)
Monocytes Relative: 10 %
Neutro Abs: 8.3 K/uL — ABNORMAL HIGH (ref 1.7–7.7)
Neutrophils Relative %: 71 %
Platelets: 204 K/uL (ref 150–400)
RBC: 4.02 MIL/uL (ref 3.87–5.11)
RDW: 14.5 % (ref 11.5–15.5)
WBC: 11.6 K/uL — ABNORMAL HIGH (ref 4.0–10.5)
nRBC: 0 % (ref 0.0–0.2)

## 2024-08-12 MED ORDER — HYDROMORPHONE HCL 1 MG/ML IJ SOLN
INTRAMUSCULAR | Status: AC
  Filled 2024-08-12: qty 0.5

## 2024-08-12 NOTE — Progress Notes (Signed)
 PROGRESS NOTE                                                                                                                                                                                                             Patient Demographics:    Nancy [name], is a 63 y.o. female, DOB - 1961/08/10, FMW:[id number]  Outpatient Primary MD for the patient is [name], MD    LOS - 2  Admit date - 08/09/2024    Chief Complaint  Patient presents with   Abdominal Pain       Brief Narrative (HPI from H&P)   63 y.o. 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  GI history significant for chronic constipation and recurrent nausea.  Presented to the ED with upper abdominal pain that started on Sunday.  Has had several episodes of emesis and 2 definitive episodes of bright red blood per rectum prior to arrival.  Since arrival to the ED patient reports every time she has dry heaves she passes bright red blood per rectum.  There is a smear of bright red blood noted on the bedsheet.  In the ED she was afebrile with initial blood pressure readings hypertensive at 171/110 but later decreased to 96 after receiving pain medication.  She was given a 500 cc lactated ringer  bolus.  Initial hemoglobin was 13.3.  Last dialysis Friday and current creatinine 7.38 with potassium of 5.3.  BC 15,000.  CT scan demonstrated ascending colitis.  Hospitalist service was consulted to evaluate the patient for admission.    Subjective:   Patient in bed, appears comfortable, denies any headache, no fever, no chest pain or pressure, no shortness of breath , no abdominal pain. No new focal weakness.  He has had 1 loose bowel movement in the last 24 hours, feeling a whole lot better.   Assessment  & Plan :   Acute lower GI bleeding with abdominal pain/CT  consistent with descending colitis Gastroenterology has been consulted, with supportive care her symptoms are much better, no diarrhea, stool studies pending however currently no diarrhea and suspicion for C. difficile is low, continue Cipro  Flagyl , continue to monitor continue to monitor CBC, clinically much improved, GI on board, type screen done.   Anemia of  chronic kidney disease Continue to trend hemoglobin given above rectal bleed, she is agreeable for transfusion if needed   CKD 5 on dialysis MWF Nephrology on board   Hypothyroidism Continue Synthroid    Hypertension Takes Normodyne  on nondialysis days Continue Imdur  and Norvasc  daily   HLD Hold Zetia  and statin until follow diet resume  Diabetes mellitus 2 Hemoglobin A1c 8.0 Resume low-dose Lantus /insulin  glargine but while n.p.o. we will use half home dose Follow CBGs and provide very sensitive SSI  CBG (last 3)  Recent Labs    08/11/24 1609 08/11/24 2116 08/12/24 0451  GLUCAP 186* 210* 125*   Lab Results  Component Value Date   HGBA1C 8.0 (H) 08/10/2024           Condition - Fair  Family Communication  :  None  Code Status :  Full  Consults  :  GI, Renal  PUD Prophylaxis :     Procedures  :     CT 1. Descending colitis. 2. Few scattered small and large bowel diverticula. No acute diverticulitis. 3. Tiny hiatal hernia      Disposition Plan  :    Status is: Inpatient  DVT Prophylaxis  :    SCDs Start: 08/10/24 9379   Lab Results  Component Value Date   PLT 218 08/11/2024    Diet :  Diet Order             DIET SOFT Room service appropriate? Yes; Fluid consistency: Thin  Diet effective now                    Inpatient Medications  Scheduled Meds:  amLODipine   10 mg Oral Daily   atorvastatin   40 mg Oral Daily   calcitRIOL   2 mcg Oral Q M,W,F   Chlorhexidine  Gluconate Cloth  6 each Topical Q0600   cinacalcet   30 mg Oral Once per day on Monday Wednesday Friday   ezetimibe    10 mg Oral Daily   insulin  aspart  0-6 Units Subcutaneous Q4H   insulin  glargine  2 Units Subcutaneous BID   isosorbide  mononitrate  60 mg Oral Daily   labetalol   300 mg Oral Daily   levothyroxine   25 mcg Oral Daily   pantoprazole   40 mg Oral Daily   sevelamer  carbonate  1,600 mg Oral TID WC   sodium chloride  flush  3 mL Intravenous Q12H   Continuous Infusions: PRN Meds:.acetaminophen  **OR** acetaminophen , ALPRAZolam , HYDROmorphone  (DILAUDID ) injection, prochlorperazine     Objective:   Vitals:   08/12/24 0350 08/12/24 0400 08/12/24 0405 08/12/24 0441  BP: 121/63 (!) 124/46 103/64 125/77  Pulse: 71 73 75 78  Resp: 13 (!) 23 16 18   Temp:  97.7 F (36.5 C)  98.2 F (36.8 C)  TempSrc:  Oral  Oral  SpO2: 98% 100% 98% 94%  Weight:  107.4 kg      Wt Readings from Last 3 Encounters:  08/12/24 107.4 kg  07/16/24 112.6 kg  07/14/24 107.5 kg     Intake/Output Summary (Last 24 hours) at 08/12/2024 0820 Last data filed at 08/12/2024 0400 Gross per 24 hour  Intake --  Output 1600 ml  Net -1600 ml     Physical Exam  Awake Alert, No new F.N deficits, Normal affect .AT,PERRAL Supple Neck, No JVD,   Symmetrical Chest wall movement, Good air movement bilaterally, CTAB RRR,No Gallops,Rubs or new Murmurs,  +ve B.Sounds, Abd Soft, No tenderness,   Bilat BKA    Data Review:  Recent Labs  Lab 08/09/24 2250 08/10/24 1037 08/10/24 1427 08/11/24 0542  WBC 14.0*  --  14.2* 13.3*  HGB 13.3 12.9 12.7 11.9*  HCT 41.8 40.9 40.2 37.8  PLT 215  --  212 218  MCV 94.4  --  94.1 93.8  MCH 30.0  --  29.7 29.5  MCHC 31.8  --  31.6 31.5  RDW 14.0  --  14.4 14.5  LYMPHSABS 1.6  --   --  2.7  MONOABS 1.0  --   --  1.3*  EOSABS 0.3  --   --  0.3  BASOSABS 0.1  --   --  0.1    Recent Labs  Lab 08/09/24 2250 08/10/24 0651 08/10/24 1037 08/11/24 0542  NA 133* 132*  --  132*  K 5.3* 5.5*  --  4.2  CL 97* 97*  --  93*  CO2 23 23  --  26  ANIONGAP 13 12  --  13  GLUCOSE 172*  180*  --  125*  BUN 39* 41*  --  33*  CREATININE 7.38* 7.70*  --  6.94*  AST 17  --   --  14*  ALT 18  --   --  16  ALKPHOS 93  --   --  84  BILITOT 0.7  --   --  0.7  ALBUMIN  3.6  --   --  3.4*  CRP  --   --   --  2.6*  PROCALCITON  --   --   --  0.39  HGBA1C  --  8.0*  --   --   MG  --   --   --  2.3  PHOS  --   --  6.2*  --   CALCIUM  9.2 9.0  --  8.9      Recent Labs  Lab 08/09/24 2250 08/10/24 0651 08/11/24 0542  CRP  --   --  2.6*  PROCALCITON  --   --  0.39  HGBA1C  --  8.0*  --   MG  --   --  2.3  CALCIUM  9.2 9.0 8.9    --------------------------------------------------------------------------------------------------------------- Lab Results  Component Value Date   CHOL 110 10/08/2023   HDL 45 10/08/2023   LDLCALC 46 10/08/2023   TRIG 95 10/08/2023   CHOLHDL 2.4 10/08/2023    Lab Results  Component Value Date   HGBA1C 8.0 (H) 08/10/2024   No results for input(s): TSH, T4TOTAL, FREET4, T3FREE, THYROIDAB in the last 72 hours. No results for input(s): VITAMINB12, FOLATE, FERRITIN, TIBC, IRON , RETICCTPCT in the last 72 hours. ------------------------------------------------------------------------------------------------------------------ Cardiac Enzymes No results for input(s): CKMB, TROPONINI, MYOGLOBIN in the last 168 hours.  Invalid input(s): CK  Micro Results No results found for this or any previous visit (from the past 240 hours).  Radiology Report No results found.    Signature  -   Lavada Stank M.D on 08/12/2024 at 8:20 AM   -  To page go to [email or web address]

## 2024-08-12 NOTE — Progress Notes (Signed)
 Nancy [name] is an 63 y.o. female with carotid artery stenosis, CASGD, DM HTN, HLD, diabetic gastroparesis, PAD, CVA; dialyzes MWF at [facility] with last treatment on 8/22 for full treatment of 4 hours of 50 minutes leaving at 107.2 kg, her EDW is 106.7 kg and she has reached in the past 3 treatments. P/W abdominal pain, vomiting and diarrhea with bright red blood per rectum as well.  Has had chronic abd pain.   HD Orders ([facility]) MWF [facility] 4h80min EDW 106.7kg (last HD 8/22 107.2kg) CVC, no active Mircera (last 50mcg given 7/30) Calcitriol  2mcg PO TID Sensipar  30mg  three times per week Heparin  5000 units  Assessment/Plan: ESRD -Patient is a very compliant patient and last treatment was on Friday for a full treatment leaving very close to her dry weight.  She has reached her dry weight at least twice in the past 3 treatments.   - Tolerated dialysis on Monday with 2 L net UF, finished early Wed 1.6L  continue MWF regimen.  She has already received dialysis twice this week and is a very compliant patient with last outpatient dialysis treatment on Friday.  Plan next treatment on Friday if she is still here.   Additional recommendations - Dose all meds for creatinine clearance < 10 ml/min  - Unless absolutely necessary, no MRIs with gadolinium.  - Prefer needle sticks in the dorsum of the hands or wrists.  No blood pressure measurements in right arm. - If blood transfusion is requested during hemodialysis sessions, please alert us  prior to the session.  - If a hemodialysis catheter line culture is requested, please alert us  as only hemodialysis nurses are able to collect those specimens.    Avoid nephrotoxic medications including NSAIDs and iodinated intravenous contrast exposure unless the latter is absolutely indicated.  Preferred narcotic agents for pain control are hydromorphone , fentanyl , and methadone. Morphine  should not be used. Avoid Baclofen and avoid oral sodium phosphate  and magnesium   citrate based laxatives / bowel preps. Continue strict Input and Output monitoring. Will monitor the patient closely with you and intervene or adjust therapy as indicated by changes in clinical status/labs    Renal osteodystrophy - phosphorus elevated; restart renvela  at 3 tabs TIDM.  If she is still here we will recheck a phosphorus level in a few days (Th).   Anemia -Last Mircera was given 7/30 and she has been taken off protocol as her hemoglobin has been above 12. Colitis w/ diarrhea -seen by GI, C. difficile and GI pathogen with eventual colonoscopy recommended DM - per primary HTN -start home regiment.  Ultrafiltration will help as well.  Subjective: She feels better, denies any more hematochezia but no bowel movement yest.  Tolerated dialysis bleeding early this morning with 1.6 L net UF.  No cramping and only mild discomfort on the right side of the abdomen which has since resolved.  Denies fever chills nausea vomiting.    Chemistry and CBC: Creat  Date/Time Value Ref Range Status  08/17/2014 04:41 PM 0.73 0.50 - 1.10 mg/dL Final   Creatinine, Ser  Date/Time Value Ref Range Status  08/11/2024 05:42 AM 6.94 (H) 0.44 - 1.00 mg/dL Final  91/74/7974 93:48 AM 7.70 (H) 0.44 - 1.00 mg/dL Final  91/75/7974 89:49 PM 7.38 (H) 0.44 - 1.00 mg/dL Final  98/92/7974 93:92 AM 5.10 (H) 0.44 - 1.00 mg/dL Final  91/92/7975 87:75 PM 6.57 (H) 0.44 - 1.00 mg/dL Final  95/92/7975 96:71 AM 6.07 (H) 0.44 - 1.00 mg/dL Final  95/93/7975 98:72  AM 6.80 (H) 0.44 - 1.00 mg/dL Final  95/94/7975 95:98 PM 6.23 (H) 0.44 - 1.00 mg/dL Final  96/74/7975 96:56 AM 7.39 (H) 0.44 - 1.00 mg/dL Final  97/78/7975 93:99 AM 5.10 (H) 0.44 - 1.00 mg/dL Final  97/78/7975 94:89 AM 4.72 (H) 0.44 - 1.00 mg/dL Final  87/93/7976 98:96 AM 5.57 (H) 0.44 - 1.00 mg/dL Final  87/94/7976 98:60 AM 4.31 (H) 0.44 - 1.00 mg/dL Final  87/95/7976 91:42 PM 4.04 (H) 0.44 - 1.00 mg/dL Final  87/95/7976 93:54 AM 3.33 (H) 0.44 - 1.00 mg/dL Final   87/96/7976 88:83 AM 4.69 (H) 0.44 - 1.00 mg/dL Final  88/79/7976 98:90 AM 4.66 (H) 0.44 - 1.00 mg/dL Final  88/80/7976 96:42 AM 6.89 (H) 0.44 - 1.00 mg/dL Final  88/80/7976 98:82 AM 6.87 (H) 0.44 - 1.00 mg/dL Final  89/89/7976 88:47 AM 4.50 (H) 0.44 - 1.00 mg/dL Final  91/77/7976 91:51 AM 3.40 (H) 0.44 - 1.00 mg/dL Final  93/81/7976 89:59 AM 3.34 (H) 0.44 - 1.00 mg/dL Final  93/85/7976 96:41 AM 2.58 (H) 0.44 - 1.00 mg/dL Final  93/86/7976 91:66 PM 2.42 (H) 0.44 - 1.00 mg/dL Final  95/75/7976 92:75 AM 5.20 (H) 0.44 - 1.00 mg/dL Final  95/78/7976 98:54 AM 5.62 (H) 0.44 - 1.00 mg/dL Final  95/79/7976 98:63 AM 4.54 (H) 0.44 - 1.00 mg/dL Final    Comment:    DELTA CHECK NOTED  04/04/2022 01:36 AM 2.95 (H) 0.44 - 1.00 mg/dL Final  95/81/7976 89:56 AM 3.88 (H) 0.44 - 1.00 mg/dL Final  95/84/7976 87:41 AM 3.22 (H) 0.44 - 1.00 mg/dL Final  95/85/7976 96:69 AM 4.15 (H) 0.44 - 1.00 mg/dL Final  95/86/7976 93:60 PM 3.95 (H) 0.44 - 1.00 mg/dL Final  95/97/7976 95:65 AM 4.60 (H) 0.44 - 1.00 mg/dL Final  96/92/7976 87:83 PM 5.54 (H) 0.44 - 1.00 mg/dL Final  96/93/7976 90:50 AM 5.20 (H) 0.44 - 1.00 mg/dL Final  96/95/7976 92:50 AM 5.03 (H) 0.44 - 1.00 mg/dL Final  96/97/7976 92:55 PM 6.17 (H) 0.44 - 1.00 mg/dL Final  96/97/7976 91:61 AM 7.30 (H) 0.44 - 1.00 mg/dL Final  96/98/7976 95:60 PM 6.20 (H) 0.44 - 1.00 mg/dL Final  96/98/7976 96:99 PM 6.03 (H) 0.44 - 1.00 mg/dL Final  97/78/7976 88:41 AM 5.97 (H) 0.44 - 1.00 mg/dL Final  97/89/7976 87:44 PM 5.59 (H) 0.44 - 1.00 mg/dL Final  97/96/7976 87:64 PM 5.31 (H) 0.44 - 1.00 mg/dL Final  98/79/7976 87:53 PM 4.76 (H) 0.44 - 1.00 mg/dL Final  98/86/7976 97:74 PM 4.27 (H) 0.44 - 1.00 mg/dL Final  98/92/7976 89:99 AM 4.42 (H) 0.44 - 1.00 mg/dL Final  98/92/7976 94:48 AM 4.21 (H) 0.44 - 1.00 mg/dL Final  98/93/7976 88:60 AM 4.20 (H) 0.44 - 1.00 mg/dL Final  87/70/7977 87:66 PM 4.01 (H) 0.44 - 1.00 mg/dL Final  88/71/7977 88:49 AM 3.52 (H) 0.44 - 1.00  mg/dL Final  88/81/7977 88:58 AM 3.54 (H) 0.44 - 1.00 mg/dL Final  88/83/7977 97:61 AM 3.43 (H) 0.44 - 1.00 mg/dL Final   Recent Labs  Lab 08/09/24 2250 08/10/24 0651 08/10/24 1037 08/11/24 0542  NA 133* 132*  --  132*  K 5.3* 5.5*  --  4.2  CL 97* 97*  --  93*  CO2 23 23  --  26  GLUCOSE 172* 180*  --  125*  BUN 39* 41*  --  33*  CREATININE 7.38* 7.70*  --  6.94*  CALCIUM  9.2 9.0  --  8.9  PHOS  --   --  6.2*  --    Recent Labs  Lab 08/09/24 2250 08/10/24 1037 08/10/24 1427 08/11/24 0542  WBC 14.0*  --  14.2* 13.3*  NEUTROABS 11.0*  --   --  8.9*  HGB 13.3 12.9 12.7 11.9*  HCT 41.8 40.9 40.2 37.8  MCV 94.4  --  94.1 93.8  PLT 215  --  212 218   Liver Function Tests: Recent Labs  Lab 08/09/24 2250 08/11/24 0542  AST 17 14*  ALT 18 16  ALKPHOS 93 84  BILITOT 0.7 0.7  PROT 7.5 7.1  ALBUMIN  3.6 3.4*   Recent Labs  Lab 08/09/24 2250  LIPASE 28   No results for input(s): AMMONIA in the last 168 hours. Cardiac Enzymes: No results for input(s): CKTOTAL, CKMB, CKMBINDEX, TROPONINI in the last 168 hours. Iron  Studies: No results for input(s): IRON , TIBC, TRANSFERRIN, FERRITIN in the last 72 hours. PT/INR: @LABRCNTIP (inr:5)  Xrays/Other Studies: ) Results for orders placed or performed during the hospital encounter of 08/09/24 (from the past 48 hours)  CBG monitoring, ED     Status: Abnormal   Collection Time: 08/10/24  7:36 AM  Result Value Ref Range   Glucose-Capillary 175 (H) 70 - 99 mg/dL    Comment: Glucose reference range applies only to samples taken after fasting for at least 8 hours.  Phosphorus     Status: Abnormal   Collection Time: 08/10/24 10:37 AM  Result Value Ref Range   Phosphorus 6.2 (H) 2.5 - 4.6 mg/dL    Comment: Performed at [facility], [address] [place], [place] [address]  Hemoglobin and hematocrit, blood     Status: None   Collection Time: 08/10/24 10:37 AM  Result Value Ref Range   Hemoglobin 12.9  12.0 - 15.0 g/dL   HCT 59.0 63.9 - 53.9 %    Comment: Performed at [facility], [address], [place], [place] [address]  CBG monitoring, ED     Status: Abnormal   Collection Time: 08/10/24 12:45 PM  Result Value Ref Range   Glucose-Capillary 147 (H) 70 - 99 mg/dL    Comment: Glucose reference range applies only to samples taken after fasting for at least 8 hours.  CBC     Status: Abnormal   Collection Time: 08/10/24  2:27 PM  Result Value Ref Range   WBC 14.2 (H) 4.0 - 10.5 K/uL   RBC 4.27 3.87 - 5.11 MIL/uL   Hemoglobin 12.7 12.0 - 15.0 g/dL   HCT 59.7 63.9 - 53.9 %   MCV 94.1 80.0 - 100.0 fL   MCH 29.7 26.0 - 34.0 pg   MCHC 31.6 30.0 - 36.0 g/dL   RDW 85.5 88.4 - 84.4 %   Platelets 212 150 - 400 K/uL   nRBC 0.0 0.0 - 0.2 %    Comment: Performed at [facility], [address] [place] [place], [place] [address]  Glucose, capillary     Status: Abnormal   Collection Time: 08/10/24  8:37 PM  Result Value Ref Range   Glucose-Capillary 194 (H) 70 - 99 mg/dL    Comment: Glucose reference range applies only to samples taken after fasting for at least 8 hours.  Glucose, capillary     Status: Abnormal   Collection Time: 08/10/24 11:34 PM  Result Value Ref Range   Glucose-Capillary 185 (H) 70 - 99 mg/dL    Comment: Glucose reference range applies only to samples taken after fasting for at least 8 hours.  Glucose, capillary     Status: Abnormal   Collection Time: 08/11/24  4:39 AM  Result Value Ref Range   Glucose-Capillary 121 (H) 70 - 99 mg/dL    Comment: Glucose reference range applies only to samples taken after fasting for at least 8 hours.  CBC with Differential/Platelet     Status: Abnormal   Collection Time: 08/11/24  5:42 AM  Result Value Ref Range   WBC 13.3 (H) 4.0 - 10.5 K/uL   RBC 4.03 3.87 - 5.11 MIL/uL   Hemoglobin 11.9 (L) 12.0 - 15.0 g/dL   HCT 62.1 63.9 - 53.9 %   MCV 93.8 80.0 - 100.0 fL   MCH 29.5 26.0 - 34.0 pg   MCHC 31.5 30.0 - 36.0 g/dL   RDW 85.4  88.4 - 84.4 %   Platelets 218 150 - 400 K/uL   nRBC 0.0 0.0 - 0.2 %   Neutrophils Relative % 67 %   Neutro Abs 8.9 (H) 1.7 - 7.7 K/uL   Lymphocytes Relative 20 %   Lymphs Abs 2.7 0.7 - 4.0 K/uL   Monocytes Relative 10 %   Monocytes Absolute 1.3 (H) 0.1 - 1.0 K/uL   Eosinophils Relative 2 %   Eosinophils Absolute 0.3 0.0 - 0.5 K/uL   Basophils Relative 1 %   Basophils Absolute 0.1 0.0 - 0.1 K/uL   Immature Granulocytes 0 %   Abs Immature Granulocytes 0.05 0.00 - 0.07 K/uL    Comment: Performed at [facility], [address] [place], [place] [address]  Comprehensive metabolic panel with GFR     Status: Abnormal   Collection Time: 08/11/24  5:42 AM  Result Value Ref Range   Sodium 132 (L) 135 - 145 mmol/L   Potassium 4.2 3.5 - 5.1 mmol/L   Chloride 93 (L) 98 - 111 mmol/L   CO2 26 22 - 32 mmol/L   Glucose, Bld 125 (H) 70 - 99 mg/dL    Comment: Glucose reference range applies only to samples taken after fasting for at least 8 hours.   BUN 33 (H) 8 - 23 mg/dL   Creatinine, Ser 3.05 (H) 0.44 - 1.00 mg/dL   Calcium  8.9 8.9 - 10.3 mg/dL   Total Protein 7.1 6.5 - 8.1 g/dL   Albumin  3.4 (L) 3.5 - 5.0 g/dL   AST 14 (L) 15 - 41 U/L   ALT 16 0 - 44 U/L   Alkaline Phosphatase 84 38 - 126 U/L   Total Bilirubin 0.7 0.0 - 1.2 mg/dL   GFR, Estimated 6 (L) >60 mL/min    Comment: (NOTE) Calculated using the CKD-EPI Creatinine Equation (2021)    Anion gap 13 5 - 15    Comment: Performed at [facility], [address], [place], [place] [address]  C-reactive protein     Status: Abnormal   Collection Time: 08/11/24  5:42 AM  Result Value Ref Range   CRP 2.6 (H) <1.0 mg/dL    Comment: Performed at [facility], [address] [place], [place] [address]  Procalcitonin     Status: None   Collection Time: 08/11/24  5:42 AM  Result Value Ref Range   Procalcitonin 0.39 ng/mL    Comment:        Interpretation: PCT (Procalcitonin) <= 0.5 ng/mL: Systemic infection (sepsis) is  not likely. Local bacterial infection is possible. (NOTE)       Sepsis PCT Algorithm  Lower Respiratory Tract                                      Infection PCT Algorithm    ----------------------------     ----------------------------         PCT < 0.25 ng/mL                PCT < 0.10 ng/mL          Strongly encourage             Strongly discourage   discontinuation of antibiotics    initiation of antibiotics    ----------------------------     -----------------------------       PCT 0.25 - 0.50 ng/mL            PCT 0.10 - 0.25 ng/mL               OR       >80% decrease in PCT            Discourage initiation of                                            antibiotics      Encourage discontinuation           of antibiotics    ----------------------------     -----------------------------         PCT >= 0.50 ng/mL              PCT 0.26 - 0.50 ng/mL               AND        <80% decrease in PCT             Encourage initiation of                                             antibiotics       Encourage continuation           of antibiotics    ----------------------------     -----------------------------        PCT >= 0.50 ng/mL                  PCT > 0.50 ng/mL               AND         increase in PCT                  Strongly encourage                                      initiation of antibiotics    Strongly encourage escalation           of antibiotics                                     -----------------------------  PCT <= 0.25 ng/mL                                                 OR                                        > 80% decrease in PCT                                      Discontinue / Do not initiate                                             antibiotics  Performed at [facility], [address], [place], [place] [address]   Magnesium      Status: None   Collection Time: 08/11/24  5:42 AM  Result Value  Ref Range   Magnesium  2.3 1.7 - 2.4 mg/dL    Comment: Performed at [facility], [address], [place], [place] [address]  Glucose, capillary     Status: Abnormal   Collection Time: 08/11/24  8:05 AM  Result Value Ref Range   Glucose-Capillary 120 (H) 70 - 99 mg/dL    Comment: Glucose reference range applies only to samples taken after fasting for at least 8 hours.   Comment 1 Notify RN   Glucose, capillary     Status: Abnormal   Collection Time: 08/11/24 11:34 AM  Result Value Ref Range   Glucose-Capillary 140 (H) 70 - 99 mg/dL    Comment: Glucose reference range applies only to samples taken after fasting for at least 8 hours.   Comment 1 Notify RN   Glucose, capillary     Status: Abnormal   Collection Time: 08/11/24  4:09 PM  Result Value Ref Range   Glucose-Capillary 186 (H) 70 - 99 mg/dL    Comment: Glucose reference range applies only to samples taken after fasting for at least 8 hours.   Comment 1 Notify RN   Glucose, capillary     Status: Abnormal   Collection Time: 08/11/24  9:16 PM  Result Value Ref Range   Glucose-Capillary 210 (H) 70 - 99 mg/dL    Comment: Glucose reference range applies only to samples taken after fasting for at least 8 hours.   Comment 1 Notify RN    Comment 2 Document in Chart   Glucose, capillary     Status: Abnormal   Collection Time: 08/12/24  4:51 AM  Result Value Ref Range   Glucose-Capillary 125 (H) 70 - 99 mg/dL    Comment: Glucose reference range applies only to samples taken after fasting for at least 8 hours.   No results found.   PMH:   Past Medical History:  Diagnosis Date   Anemia 2006   Anginal pain (HCC)    Carotid artery disease (HCC)    right ICA occlusion, 40-59% LICA 02/2021 US    CHF (congestive heart failure) (HCC)    Coronary artery disease    Depression 2014   previously on amitryptiline    Diabetes  mellitus with neurological manifestation (HCC) 2006   Diabetic peripheral neuropathy (HCC) 04/26/2020    Dysphagia    Dyspnea    ESRD on hemodialysis (HCC)    Fracture of left ankle 1997   Gastroparesis 07/2016   Heart murmur    HOH (hard of hearing) 2004   Hyperlipidemia 2006   Hypertension 2006   IBS (irritable bowel syndrome) 2002   Leukopenia 2015   Macular degeneration 11/2019   Peripheral vascular disease (HCC)    Shingles 2009   Stroke [facility]) 2020   Thyroid  nodule 2004    PSH:   Past Surgical History:  Procedure Laterality Date   A/V FISTULAGRAM Right 09/25/2022   Procedure: A/V Fistulagram;  Surgeon: Serene Gaile ORN, MD;  Location: [facility] INVASIVE CV LAB;  Service: Cardiovascular;  Laterality: Right;   ABDOMINAL HYSTERECTOMY  2005   AMPUTATION Bilateral 04/04/2022   Procedure: BILATERAL BELOW KNEE AMPUTATION;  Surgeon: Harden Jerona GAILS, MD;  Location: [facility] OR;  Service: Orthopedics;  Laterality: Bilateral;   AV FISTULA PLACEMENT Right 12/22/2021   Procedure: RIGHT ARM ARTERIOVENOUS (AV) BRACIOCPHELAIC FISTULA CREATION;  Surgeon: Sheree Penne Bruckner, MD;  Location: [facility] OR;  Service: Vascular;  Laterality: Right;   AV FISTULA PLACEMENT Right 02/19/2022   Procedure: RIGHT ARTERIOVENOUS FISTULA;  Surgeon: Eliza Bruckner RAMAN, MD;  Location: [facility] OR;  Service: Vascular;  Laterality: Right;   AV FISTULA PLACEMENT Left 12/24/2023   Procedure: LEFT BRACHIOCEPHALIC ARTERIOVENOUS (AV) FISTULA CREATION;  Surgeon: Lanis Fonda BRAVO, MD;  Location: [facility] OR;  Service: Vascular;  Laterality: Left;   BASCILIC VEIN TRANSPOSITION Right 08/07/2022   Procedure: SECOND STAGE RIGHT BASILIC VEIN TRANSPOSITION;  Surgeon: Eliza Bruckner RAMAN, MD;  Location: [facility] OR;  Service: Vascular;  Laterality: Right;   CATARACT EXTRACTION Left 11/2019   CESAREAN SECTION  1983   CHOLECYSTECTOMY N/A 03/05/2020   Procedure: LAPAROSCOPIC CHOLECYSTECTOMY WITH INTRAOPERATIVE CHOLANGIOGRAM;  Surgeon: Belinda Cough, MD;  Location: [facility] ORS;  Service: General;  Laterality: N/A;   COLONOSCOPY     [facility] said no polyps  were found   ESOPHAGOGASTRODUODENOSCOPY (EGD) WITH PROPOFOL  Left 08/26/2014   Procedure: ESOPHAGOGASTRODUODENOSCOPY (EGD) WITH PROPOFOL ;  Surgeon: Elsie Cree, MD;  Location: [facility] ENDOSCOPY;  Service: Endoscopy;  Laterality: Left;   ESOPHAGOGASTRODUODENOSCOPY (EGD) WITH PROPOFOL  N/A 03/03/2020   Procedure: ESOPHAGOGASTRODUODENOSCOPY (EGD) WITH PROPOFOL ;  Surgeon: Albertus Gordy HERO, MD;  Location: [facility] ENDOSCOPY;  Service: Gastroenterology;  Laterality: N/A;   IR FLUORO GUIDE CV LINE RIGHT  02/15/2022   IR US  GUIDE VASC ACCESS RIGHT  02/15/2022   PERIPHERAL VASCULAR BALLOON ANGIOPLASTY Right 09/25/2022   Procedure: PERIPHERAL VASCULAR BALLOON ANGIOPLASTY;  Surgeon: Serene Gaile ORN, MD;  Location: [facility];  Service: Cardiovascular;  Laterality: Right;  AVF   RIGHT HEART CATH N/A 07/14/2021   Procedure: RIGHT HEART CATH;  Surgeon: Rolan Ezra RAMAN, MD;  Location: [facility] INVASIVE CV LAB;  Service: Cardiovascular;  Laterality: N/A;   RIGHT/LEFT HEART CATH AND CORONARY ANGIOGRAPHY N/A 04/27/2021   Procedure: RIGHT/LEFT HEART CATH AND CORONARY ANGIOGRAPHY;  Surgeon: Court Dorn PARAS, MD;  Location: [facility];  Service: Cardiovascular;  Laterality: N/A;    Allergies:  Allergies  Allergen Reactions   Hydralazine  Other (See Comments)   Hydralazine  Hcl Other (See Comments)    Hair loss   Hydrocodone Itching and Other (See Comments)    Upset stomach   Metformin  And Related Nausea And Vomiting and Other (See Comments)    Stomach pains, also   Other Nausea  Only and Other (See Comments)    Lettuce- Does not digest this!!   Plaquenil [Hydroxychloroquine Sulfate] Hives   Shellfish Allergy Nausea And Vomiting   Shellfish-Derived Products Nausea Only and Other (See Comments)    Caused an upset stomach   Shrimp (Diagnostic) Nausea Only and Other (See Comments)    Upset stomach    Sulfa Antibiotics Hives   Sulfur  Hives    Medications:   Prior to Admission medications   Medication Sig  Start Date End Date Taking? Authorizing Provider  acetaminophen  (TYLENOL ) 500 MG tablet Take 1,000 mg by mouth every 8 (eight) hours as needed for mild pain (pain score 1-3), moderate pain (pain score 4-6) or headache.   Yes [provider]  albuterol  (VENTOLIN  HFA) 108 (90 Base) MCG/ACT inhaler Inhale 2 puffs into the lungs every 6 (six) hours as needed. 08/21/23  Yes [name], MD  ALPRAZolam  (XANAX ) 0.25 MG tablet Take 1 tablet (0.25 mg total) by mouth on Monday, Wednesday and Friday at hemodialysis. 06/10/24  Yes [name], MD  amLODipine  (NORVASC ) 10 MG tablet Take 1 tablet (10 mg total) by mouth daily for hypertension 06/04/24  Yes [name], MD  aspirin  81 MG EC tablet Take 1 tablet (81 mg total) by mouth daily. 09/26/21  Yes Milford, Harlene HERO, FNP  atorvastatin  (LIPITOR) 40 MG tablet Take 1 tablet (40 mg total) by mouth daily for cholesterol. 06/04/24  Yes [name], MD  ezetimibe  (ZETIA ) 10 MG tablet Take 1 tablet (10 mg total) by mouth daily for cholesterol. 06/04/24  Yes [name], MD  insulin  glargine (LANTUS  SOLOSTAR) 100 UNIT/ML Solostar Pen Inject 5 Units into the skin 2 (two) times daily. Increased by Provider on 6/24 06/16/24  Yes [name], MD  isosorbide  mononitrate (IMDUR ) 30 MG 24 hr tablet Take 2 tablets (60 mg total) by mouth daily for blood pressure. 06/04/24  Yes [name], MD  labetalol  (NORMODYNE ) 300 MG tablet Take 1 tablet (300 mg total) by mouth 2 (two) times daily. Patient taking differently: Take 300 mg by mouth daily. Take one tablet by mouth on Tues, Thurs, Saturday, and Sunday in the morning (non-dialysis days). Take one tablet by mouth on Mon, Wed,and Fri in the evening after dialysis. 06/04/24  Yes [name], MD  levothyroxine  (SYNTHROID ) 25 MCG tablet Take 1 tablet (25 mcg total) by mouth daily for low thyroid  hormone. 06/04/24  Yes [name], MD  Menthol, Topical Analgesic, (BIOFREEZE  COOL THE PAIN) 4 % GEL Apply 1 Application topically as needed (Shoulder and knee pain.).   Yes [provider]  omeprazole  (PRILOSEC) 20 MG capsule Take 2 capsules (40 mg total) by mouth daily for acid reflux. 07/13/24  Yes [name], MD  ondansetron  (ZOFRAN ) 4 MG tablet Take 1 tablet (4 mg total) by mouth every 8 (eight) hours as needed for nausea. 04/29/24  Yes   polyethylene glycol powder (GLYCOLAX /MIRALAX ) 17 GM/SCOOP powder Mix 1 capful with 8 ounces of fluid and take by mouth daily as needed. 03/25/24  Yes [name], MD  sevelamer  carbonate (RENVELA ) 800 MG tablet Take 3 tablets (2,400 mg total) by mouth 3 (three) times daily with meals and 1 tablet with snacks. 09/04/23  Yes   Insulin  NPH Isophane & Regular (RELION 70/30 Harper) Inject 35 Units into the skin 2 (two) times daily.  08/17/14  [provider]    Discontinued Meds:   Medications Discontinued During This  Encounter  Medication Reason   lactated ringers  bolus 1,000 mL    vancomycin  (VANCOCIN ) capsule 125 mg    fentaNYL  (SUBLIMAZE ) injection 50 mcg    Blood Glucose Monitoring Suppl (ACCU-CHEK GUIDE) w/Device KIT Completed Course   Continuous Glucose Receiver (FREESTYLE LIBRE 3 READER) DEVI    Continuous Glucose Sensor (FREESTYLE LIBRE 3 PLUS SENSOR) MISC    Multiple Vitamin (MULTI-VITAMIN DAILY) TABS Patient Preference   Accu-Chek Softclix Lancets lancets    glucose blood (ACCU-CHEK GUIDE) test strip    Insulin  Pen Needle (PEN NEEDLES) 31G X 8 MM MISC    fentaNYL  (SUBLIMAZE ) injection 25-50 mcg    heparin  injection 1,000 Units Patient Transfer   insulin  glargine (LANTUS ) Solostar Pen 2 Units Duplicate   metroNIDAZOLE  (FLAGYL ) IVPB 500 mg    ciprofloxacin  (CIPRO ) IVPB 400 mg    heparin  injection 5,000 Units     Social History:  reports that she has quit smoking. Her smoking use included cigarettes. She has a 0.1 pack-year smoking history. She has never been exposed to tobacco smoke. She has never  used smokeless tobacco. She reports that she does not drink alcohol and does not use drugs.  Family History:   Family History  Problem Relation Age of Onset   Hypertension Mother    Heart disease Mother    Diabetes Mother    Thyroid  disease Mother    Congestive Heart Failure Mother    Breast cancer Maternal Grandmother    Colon cancer Maternal Grandfather    Heart attack Sister    Heart disease Brother    Hyperlipidemia Brother    Hypertension Brother    Diabetes Father    Breast cancer Maternal Aunt     Blood pressure 125/77, pulse 78, temperature 98.2 F (36.8 C), temperature source Oral, resp. rate 17, weight 107.4 kg, SpO2 94%. Physical Exam: General appearance: NAD  Head: NCAT  Neck: trachea midline Back: No CVA tenderness. Resp: CTA  b/l Cardio: regular rate and rhythm GI: Normal tenderness, no rebound Extremities: Bilateral BKA with well-healed wounds, prosthetics off  Access: [facility]     MELIA LYNWOOD ORN, MD 08/12/2024, 7:29 AM

## 2024-08-12 NOTE — Plan of Care (Signed)

## 2024-08-12 NOTE — Progress Notes (Signed)
   08/12/24 1035  Mobility  Activity Stood at bedside;Ambulated with assistance (STSx5)  Level of Assistance Contact guard assist, steadying assist  Assistive Device Front wheel walker  Distance Ambulated (ft) 3 ft  Activity Response Tolerated fair  Mobility Referral Yes  Mobility visit 1 Mobility  Mobility Specialist Start Time (ACUTE ONLY) 1035  Mobility Specialist Stop Time (ACUTE ONLY) 1055  Mobility Specialist Time Calculation (min) (ACUTE ONLY) 20 min   Mobility Specialist: Progress Note  Pre-Mobility:                  SpO2 89 RA During Mobility: HR 85, SpO2 93% 2L Post-Mobility:    HR 92, SpO2 93% 2L  Pt agreeable to mobility session - received in bed. C/o fatigue from HD earlier and winded with exertion. Returned to sitting position in bed with all needs met - call bell within reach. Bed alarm on. Pt required assistance with don/doff prothesis.   Virgle Boards, BS Mobility Specialist Please contact via SecureChat or  Rehab office at [phone].

## 2024-08-12 NOTE — Progress Notes (Signed)
 HD Note:  Some information was entered later than the data was gathered due to patient care needs. The stated time with the data is accurate.  Received patient in bed to unit.   Alert and oriented.   Informed consent signed and in chart.   Access used: [facility] Access issues: None  Patient tolerated treatment well.   TX duration: 3 hours 10 min.  Ended early due to clotting dialyzer.    Alert, without acute distress.  Total UF removed: 1.6L  Hand-off given to patient's nurse.   Transported back to the room   Bobetta Lango RN [facility].

## 2024-08-12 NOTE — TOC Initial Note (Signed)
 Transition of Care [facility]) - Initial/Assessment Note    Patient Details  Name: Nancy [name] MRN: [id number] Date of Birth: June 27, 1961  [facility] of [facility]) CM/SW Contact:    Marval Gell, RN Phone Number: 08/12/2024, 11:33 AM  Clinical Narrative:                 [name] w patient by phone. She is agreeable to home with home health services. She will buy from pharmacy. Sliding board will be delivered to her room. She has no preference for [facility] agency and [facility] accepted.   Expected Discharge Plan: Home w Home Health Services Barriers to Discharge: No Barriers Identified   Patient Goals and CMS Choice Patient states their goals for this hospitalization and ongoing recovery are:: to go home   Choice offered to / list presented to : Patient      Expected Discharge Plan and Services   Discharge Planning Services: CM Consult Post Acute Care Choice: Durable Medical Equipment Living arrangements for the past 2 months: Single Family Home                 DME Arranged:  (sliding board) DME Agency: [employer] Date DME Agency Contacted: 08/12/24 Time DME Agency Contacted: 1133 Representative spoke with at DME Agency: [place] HH Arranged: PT HH Agency: [facility] Date [facility] Agency Contacted: 08/12/24 Time HH Agency Contacted: 1133 Representative spoke with at [facility] Agency: [place]  Prior Living Arrangements/Services Living arrangements for the past 2 months: Single Family Home Lives with:: Self   Do you feel safe going back to the place where you live?: Yes               Activities of Daily Living   ADL Screening (condition at time of admission) Independently performs ADLs?: Yes (appropriate for developmental age) Is the patient deaf or have difficulty hearing?: No Does the patient have difficulty seeing, even when wearing glasses/contacts?: No Does the patient have difficulty concentrating, remembering, or making decisions?: No  Permission  Sought/Granted                  Emotional Assessment              Admission diagnosis:  Colitis [K52.9] Lower GI bleed [K92.2] Acute colitis [K52.9] Patient Active Problem List   Diagnosis Date Noted   Acute colitis 08/10/2024   Rectal bleeding 08/10/2024   Abnormal CT scan, gastrointestinal tract 08/10/2024   Irritable bowel syndrome with constipation 06/04/2024   S/P bilateral BKA (below knee amputation) (HCC) 02/04/2024   History of CVA (cerebrovascular accident) 03/22/2023   Community acquired pneumonia 03/22/2023   Pain, unspecified 08/15/2022   Complication of vascular dialysis catheter 08/07/2022   Chest pain 05/30/2022   Fluid overload 05/30/2022   Iron  deficiency anemia, unspecified 04/26/2022   Nausea 04/10/2022   Hyponatremia 04/04/2022   Prolonged QT interval 04/04/2022   Anaphylactic shock, unspecified, initial encounter 02/23/2022   Other allergy, initial encounter 02/23/2022   Pressure injury of skin 02/19/2022   Anemia in chronic kidney disease 02/16/2022   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) 02/16/2022   Hyperlipidemia, unspecified 02/16/2022   Hypertensive heart and chronic kidney disease with heart failure and with stage 5 chronic kidney disease, or end stage renal disease (HCC) 02/16/2022   Personal history of nicotine dependence 02/16/2022   Personal history of transient ischemic attack (TIA), and cerebral infarction without residual deficits 02/16/2022   Secondary hyperparathyroidism of renal origin (HCC) 02/16/2022  Renal anasarca 02/14/2022   ESRD on dialysis (HCC) 02/14/2022   Hyperkalemia 02/14/2022   S/P arteriovenous (AV) fistula creation 12/22/2021   Peripheral artery disease (HCC) 10/27/2021   Acute exacerbation of congestive heart failure (HCC) 10/24/2021   Acute cardiogenic pulmonary edema (HCC) 10/06/2021   CAD (coronary artery disease) 10/06/2021   Goals of care, counseling/discussion    Gangrene of left foot (HCC)     Cellulitis of left foot    CHF (congestive heart failure) (HCC) 07/07/2021   Acute postoperative anemia due to expected blood loss superimposed on anemia of chronic renal insufficiency 06/30/2021   CKD (chronic kidney disease), stage IV (HCC) 06/11/2021   Chronic diastolic CHF (congestive heart failure) (HCC) 06/10/2021   Abnormal nuclear stress test    Dyspnea on exertion 03/07/2021   Orthopnea 02/25/2021   History of cerebrovascular accident (CVA) with residual deficit 05/06/2020   AKI (acute kidney injury) (HCC) 04/20/2020   Generalized weakness 04/20/2020   Dehydration 04/20/2020   Generalized abdominal pain    Pancreatitis, acute 02/29/2020   Cerebrovascular accident (CVA) (HCC) 11/08/2019   Stenosis of right carotid artery 11/08/2019   Cortical age-related cataract of both eyes 08/30/2019   Gait abnormality 08/30/2019   Statin declined 08/30/2019   Gastroesophageal reflux disease without esophagitis 04/18/2018   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   Insulin  dependent type 2 diabetes mellitus (HCC) 10/29/2017   History of macular degeneration 10/29/2017   Chronic cervical pain 10/29/2016   Myalgia 09/14/2016   Insomnia 09/14/2016   Tinea pedis of both feet 08/20/2016   Macular degeneration 08/17/2016   Low back pain 09/21/2014   Hypertensive urgency 09/21/2014   HTN (hypertension) 09/08/2014   Thyroid  nodule 08/17/2014   Morbid obesity (HCC) 08/17/2014   Hypokalemia 08/06/2014   Type II diabetes mellitus with neurological manifestations, uncontrolled 08/04/2014   PCP:  [name], MD Pharmacy:   [place] [facility] [address] [place] [address] Phone: [phone] Fax: [phone]  [facility] [address], [address] [place] [address] Phone: [phone] Fax: [phone]  [facility] [phone] [place], [place] - [address] [place] [address] Phone: [phone] Fax: [phone]     Social Drivers of Health (SDOH) Social History: SDOH Screenings   Food Insecurity: No Food Insecurity (08/11/2024)  Housing: Low Risk  (08/11/2024)  Transportation Needs: No Transportation Needs (08/11/2024)  Utilities: Not At Risk (08/11/2024)  Alcohol Screen: Low Risk  (02/04/2024)  Depression (PHQ2-9): Low Risk  (06/04/2024)  Financial Resource Strain: Low Risk  (02/04/2024)  Physical Activity: Inactive (02/04/2024)  Social Connections: Moderately Integrated (02/04/2024)  Stress: No Stress Concern Present (02/04/2024)  Tobacco Use: Medium Risk (07/16/2024)  Health Literacy: Adequate Health Literacy (10/22/2023)   SDOH Interventions:     Readmission Risk Interventions    02/16/2022   11:55 AM  Readmission Risk Prevention Plan  Transportation Screening Complete  Medication Review Oceanographer) Complete

## 2024-08-13 ENCOUNTER — Encounter (HOSPITAL_COMMUNITY): Payer: Self-pay | Admitting: Family Medicine

## 2024-08-13 DIAGNOSIS — K625 Hemorrhage of anus and rectum: Secondary | ICD-10-CM | POA: Diagnosis not present

## 2024-08-13 DIAGNOSIS — K529 Noninfective gastroenteritis and colitis, unspecified: Secondary | ICD-10-CM | POA: Diagnosis not present

## 2024-08-13 DIAGNOSIS — R933 Abnormal findings on diagnostic imaging of other parts of digestive tract: Secondary | ICD-10-CM | POA: Diagnosis not present

## 2024-08-13 LAB — CBC WITH DIFFERENTIAL/PLATELET
Abs Immature Granulocytes: 0.05 K/uL (ref 0.00–0.07)
Basophils Absolute: 0.1 K/uL (ref 0.0–0.1)
Basophils Relative: 1 %
Eosinophils Absolute: 0.4 K/uL (ref 0.0–0.5)
Eosinophils Relative: 4 %
HCT: 35.2 % — ABNORMAL LOW (ref 36.0–46.0)
Hemoglobin: 11.1 g/dL — ABNORMAL LOW (ref 12.0–15.0)
Immature Granulocytes: 0 %
Lymphocytes Relative: 15 %
Lymphs Abs: 1.8 K/uL (ref 0.7–4.0)
MCH: 29.4 pg (ref 26.0–34.0)
MCHC: 31.5 g/dL (ref 30.0–36.0)
MCV: 93.4 fL (ref 80.0–100.0)
Monocytes Absolute: 1.2 K/uL — ABNORMAL HIGH (ref 0.1–1.0)
Monocytes Relative: 10 %
Neutro Abs: 8.3 K/uL — ABNORMAL HIGH (ref 1.7–7.7)
Neutrophils Relative %: 70 %
Platelets: 190 K/uL (ref 150–400)
RBC: 3.77 MIL/uL — ABNORMAL LOW (ref 3.87–5.11)
RDW: 14.4 % (ref 11.5–15.5)
WBC: 11.9 K/uL — ABNORMAL HIGH (ref 4.0–10.5)
nRBC: 0 % (ref 0.0–0.2)

## 2024-08-13 LAB — GLUCOSE, CAPILLARY
Glucose-Capillary: 177 mg/dL — ABNORMAL HIGH (ref 70–99)
Glucose-Capillary: 200 mg/dL — ABNORMAL HIGH (ref 70–99)
Glucose-Capillary: 193 mg/dL — ABNORMAL HIGH (ref 70–99)
Glucose-Capillary: 218 mg/dL — ABNORMAL HIGH (ref 70–99)
Glucose-Capillary: 226 mg/dL — ABNORMAL HIGH (ref 70–99)

## 2024-08-13 LAB — HEPATITIS B SURFACE ANTIBODY, QUANTITATIVE: Hep B S AB Quant (Post): 4202 m[IU]/mL

## 2024-08-13 MED ORDER — CHLORHEXIDINE GLUCONATE CLOTH 2 % EX PADS
6.0000 | MEDICATED_PAD | Freq: Every day | CUTANEOUS | Status: DC
  Administered 2024-08-14: 6 via TOPICAL

## 2024-08-13 NOTE — Care Management Important Message (Signed)
 Important Message  Patient Details  Name: ANIELLA WANDREY MRN: [id number] Date of Birth: [birth year]   Important Message Given:  Yes - Medicare IM     Claretta Deed 08/13/2024, 4:15 PM

## 2024-08-13 NOTE — Progress Notes (Signed)
 PROGRESS NOTE                                                                                                                                                                                                             Patient Demographics:    Nancy [name], is a 63 y.o. female, DOB - 11/12/1961, FMW:[id number]  Outpatient Primary MD for the patient is [name], MD    LOS - 3  Admit date - 08/09/2024    Chief Complaint  Patient presents with   Abdominal Pain       Brief Narrative (HPI from H&P)   63 y.o. 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  GI history significant for chronic constipation and recurrent nausea.  Presented to the ED with upper abdominal pain that started on Sunday.  Has had several episodes of emesis and 2 definitive episodes of bright red blood per rectum prior to arrival.  Since arrival to the ED patient reports every time she has dry heaves she passes bright red blood per rectum.  There is a smear of bright red blood noted on the bedsheet.  In the ED she was afebrile with initial blood pressure readings hypertensive at 171/110 but later decreased to 96 after receiving pain medication.  She was given a 500 cc lactated ringer  bolus.  Initial hemoglobin was 13.3.  Last dialysis Friday and current creatinine 7.38 with potassium of 5.3.  BC 15,000.  CT scan demonstrated ascending colitis.  Hospitalist service was consulted to evaluate the patient for admission.    Subjective:   Patient in bed, appears comfortable, denies any headache, no fever, no chest pain or pressure, no shortness of breath , no abdominal pain nauseated this morning has thrown up x 2. No new focal weakness.    Assessment  & Plan :   Acute lower GI bleeding with abdominal pain/CT consistent with descending  colitis Gastroenterology has been consulted, with supportive care her symptoms are much better, no diarrhea, stool studies pending however currently no diarrhea and suspicion for C. difficile is low, CBC stable was initially on Cipro  Flagyl  but currently discontinued by GI, nausea vomiting again on 08/13/2024 exam benign monitor with supportive care..   Anemia of chronic kidney disease Continue to  trend hemoglobin given above rectal bleed, she is agreeable for transfusion if needed   CKD 5 on dialysis MWF Nephrology on board   Hypothyroidism Continue Synthroid    Hypertension Takes Normodyne  on nondialysis days Continue Imdur  and Norvasc  daily   HLD Hold Zetia  and statin until follow diet resume  Diabetes mellitus 2 Hemoglobin A1c 8.0 Resume low-dose Lantus /insulin  glargine but while n.p.o. we will use half home dose Follow CBGs and provide very sensitive SSI  CBG (last 3)  Recent Labs    08/12/24 2340 08/13/24 0333 08/13/24 0855  GLUCAP 233* 177* 200*   Lab Results  Component Value Date   HGBA1C 8.0 (H) 08/10/2024           Condition - Fair  Family Communication  :  None  Code Status :  Full  Consults  :  GI, Renal  PUD Prophylaxis :     Procedures  :     CT 1. Descending colitis. 2. Few scattered small and large bowel diverticula. No acute diverticulitis. 3. Tiny hiatal hernia      Disposition Plan  :    Status is: Inpatient  DVT Prophylaxis  :    SCDs Start: 08/10/24 0620   Lab Results  Component Value Date   PLT 190 08/13/2024    Diet :  Diet Order             DIET SOFT Room service appropriate? Yes; Fluid consistency: Thin  Diet effective now                    Inpatient Medications  Scheduled Meds:  amLODipine   10 mg Oral Daily   atorvastatin   40 mg Oral Daily   calcitRIOL   2 mcg Oral Q M,W,F   Chlorhexidine  Gluconate Cloth  6 each Topical Q0600   cinacalcet   30 mg Oral Once per day on Monday Wednesday Friday   ezetimibe    10 mg Oral Daily   insulin  aspart  0-6 Units Subcutaneous Q4H   insulin  glargine  2 Units Subcutaneous BID   isosorbide  mononitrate  60 mg Oral Daily   labetalol   300 mg Oral Daily   levothyroxine   25 mcg Oral Daily   pantoprazole   40 mg Oral Daily   sevelamer  carbonate  1,600 mg Oral TID WC   sodium chloride  flush  3 mL Intravenous Q12H   Continuous Infusions: PRN Meds:.acetaminophen  **OR** acetaminophen , ALPRAZolam , HYDROmorphone  (DILAUDID ) injection, prochlorperazine     Objective:   Vitals:   08/12/24 1622 08/12/24 2000 08/13/24 0000 08/13/24 0853  BP:  (!) 159/70 102/87   Pulse: 78 78 80 75  Resp:  14 15   Temp: (!) 97.5 F (36.4 C) (!) 97.5 F (36.4 C) 97.9 F (36.6 C) (!) 97.5 F (36.4 C)  TempSrc: Oral  Oral Oral  SpO2:  95% 94%   Weight:        Wt Readings from Last 3 Encounters:  08/12/24 107.4 kg  07/16/24 112.6 kg  07/14/24 107.5 kg     Intake/Output Summary (Last 24 hours) at 08/13/2024 0903 Last data filed at 08/13/2024 0200 Gross per 24 hour  Intake 480 ml  Output --  Net 480 ml     Physical Exam  Awake Alert, No new F.N deficits, Normal affect [place].AT,PERRAL Supple Neck, No JVD,   Symmetrical Chest wall movement, Good air movement bilaterally, CTAB RRR,No Gallops,Rubs or new Murmurs,  +ve B.Sounds, Abd Soft, No tenderness,   Bilat BKA    Data Review:  Recent Labs  Lab 08/09/24 2250 08/10/24 1037 08/10/24 1427 08/11/24 0542 08/12/24 1059 08/13/24 0635  WBC 14.0*  --  14.2* 13.3* 11.6* 11.9*  HGB 13.3 12.9 12.7 11.9* 11.9* 11.1*  HCT 41.8 40.9 40.2 37.8 37.9 35.2*  PLT 215  --  212 218 204 190  MCV 94.4  --  94.1 93.8 94.3 93.4  MCH 30.0  --  29.7 29.5 29.6 29.4  MCHC 31.8  --  31.6 31.5 31.4 31.5  RDW 14.0  --  14.4 14.5 14.5 14.4  LYMPHSABS 1.6  --   --  2.7 1.8 1.8  MONOABS 1.0  --   --  1.3* 1.1* 1.2*  EOSABS 0.3  --   --  0.3 0.3 0.4  BASOSABS 0.1  --   --  0.1 0.1 0.1    Recent Labs  Lab 08/09/24 2250 08/10/24 0651  08/10/24 1037 08/11/24 0542  NA 133* 132*  --  132*  K 5.3* 5.5*  --  4.2  CL 97* 97*  --  93*  CO2 23 23  --  26  ANIONGAP 13 12  --  13  GLUCOSE 172* 180*  --  125*  BUN 39* 41*  --  33*  CREATININE 7.38* 7.70*  --  6.94*  AST 17  --   --  14*  ALT 18  --   --  16  ALKPHOS 93  --   --  84  BILITOT 0.7  --   --  0.7  ALBUMIN  3.6  --   --  3.4*  CRP  --   --   --  2.6*  PROCALCITON  --   --   --  0.39  HGBA1C  --  8.0*  --   --   MG  --   --   --  2.3  PHOS  --   --  6.2*  --   CALCIUM  9.2 9.0  --  8.9      Recent Labs  Lab 08/09/24 2250 08/10/24 0651 08/11/24 0542  CRP  --   --  2.6*  PROCALCITON  --   --  0.39  HGBA1C  --  8.0*  --   MG  --   --  2.3  CALCIUM  9.2 9.0 8.9    --------------------------------------------------------------------------------------------------------------- Lab Results  Component Value Date   CHOL 110 10/08/2023   HDL 45 10/08/2023   LDLCALC 46 10/08/2023   TRIG 95 10/08/2023   CHOLHDL 2.4 10/08/2023    Lab Results  Component Value Date   HGBA1C 8.0 (H) 08/10/2024   No results for input(s): TSH, T4TOTAL, FREET4, T3FREE, THYROIDAB in the last 72 hours. No results for input(s): VITAMINB12, FOLATE, FERRITIN, TIBC, IRON , RETICCTPCT in the last 72 hours. ------------------------------------------------------------------------------------------------------------------ Cardiac Enzymes No results for input(s): CKMB, TROPONINI, MYOGLOBIN in the last 168 hours.  Invalid input(s): CK  Micro Results No results found for this or any previous visit (from the past 240 hours).  Radiology Report No results found.    Signature  -   Lavada Stank M.D on 08/13/2024 at 9:03 AM   -  To page go to [email or web address]

## 2024-08-13 NOTE — Care Management Important Message (Signed)
 Important Message  Patient Details  Name: Nancy [name] MRN: [id number] Date of Birth: 03-Nov-1961   Important Message Given:  Yes - Medicare IM     Claretta Deed 08/13/2024, 4:14 PM

## 2024-08-13 NOTE — Plan of Care (Signed)
  Problem: Health Behavior/Discharge Planning: Goal: Ability to manage health-related needs will improve Outcome: Progressing   Problem: Metabolic: Goal: Ability to maintain appropriate glucose levels will improve Outcome: Progressing   Problem: Nutritional: Goal: Maintenance of adequate nutrition will improve Outcome: Progressing   Problem: Education: Goal: Knowledge of General Education information will improve Description: Including pain rating scale, medication(s)/side effects and non-pharmacologic comfort measures Outcome: Progressing

## 2024-08-13 NOTE — Progress Notes (Signed)
   08/13/24 1114  Mobility  Activity Respositioned in chair  Level of Assistance Standby assist, set-up cues, supervision of patient - no hands on  Assistive Device Other (Comment) (Armrests)  Range of Motion/Exercises Active;Right arm;All extremities;Left arm (BUE Exercises)  Activity Response Tolerated well  Mobility Referral Yes  Mobility visit 1 Mobility  Mobility Specialist Start Time (ACUTE ONLY) 1114  Mobility Specialist Stop Time (ACUTE ONLY) 1120  Mobility Specialist Time Calculation (min) (ACUTE ONLY) 6 min   Mobility Specialist: Progress Note  Pre-Mobility:      HR 75 During Mobility: HR 77  Pt agreeable to mobility session - received in chair. Pt was asymptomatic throughout session with no complaints. Returned to chair with all needs met - call bell within reach. Chair alarm on. Pt deferred further mobility would like to sit in chair for longer period of time.   BUE Exercises (x10): Lateral Raises, Punches, Shoulder Shrugs, Overhead Reaches.   Virgle Boards, BS Mobility Specialist Please contact via SecureChat or  Rehab office at [phone].

## 2024-08-13 NOTE — Progress Notes (Signed)
 Nancy [name] is an 63 y.o. female with carotid artery stenosis, CASGD, DM HTN, HLD, diabetic gastroparesis, PAD, CVA; dialyzes MWF at [facility] with last treatment on 8/22 for full treatment of 4 hours of 50 minutes leaving at 107.2 kg, her EDW is 106.7 kg and she has reached in the past 3 treatments. P/W abdominal pain, vomiting and diarrhea with bright red blood per rectum as well.  Has had chronic abd pain.   HD Orders ([facility]) MWF [facility] 4h54min EDW 106.7kg (last HD 8/22 107.2kg) CVC, no active Mircera (last 50mcg given 7/30) Calcitriol  2mcg PO TID Sensipar  30mg  three times per week Heparin  5000 units  Assessment/Plan: ESRD -Patient is a very compliant patient and last treatment was on Friday for a full treatment leaving very close to her dry weight.  She has reached her dry weight at least twice in the past 3 treatments.   - Tolerated dialysis on Monday with 2 L net UF, finished early Wed 1.6,  continue MWF regimen.  She has already received dialysis twice this week and is a very compliant patient with last outpatient dialysis treatment on Friday.  Plan next treatment on Friday if she is still here.   Additional recommendations - Dose all meds for creatinine clearance < 10 ml/min  - Unless absolutely necessary, no MRIs with gadolinium.  - Prefer needle sticks in the dorsum of the hands or wrists.  No blood pressure measurements in right arm. - If blood transfusion is requested during hemodialysis sessions, please alert us  prior to the session.  - If a hemodialysis catheter line culture is requested, please alert us  as only hemodialysis nurses are able to collect those specimens.    Avoid nephrotoxic medications including NSAIDs and iodinated intravenous contrast exposure unless the latter is absolutely indicated.  Preferred narcotic agents for pain control are hydromorphone , fentanyl , and methadone. Morphine  should not be used. Avoid Baclofen and avoid oral sodium phosphate  and magnesium   citrate based laxatives / bowel preps. Continue strict Input and Output monitoring. Will monitor the patient closely with you and intervene or adjust therapy as indicated by changes in clinical status/labs    Renal osteodystrophy - phosphorus elevated; restart renvela  at 3 tabs TIDM.  If she is still here we will recheck a phosphorus level in a few days (Fr).   Anemia -Last Mircera was given 7/30 and she has been taken off protocol as her hemoglobin has been above 12. Colitis w/ diarrhea -seen by GI, C. difficile and GI pathogen with eventual colonoscopy recommended DM - per primary HTN -start home regiment.  Ultrafiltration will help as well.  Subjective: She feels better, denies any more hematochezia; some nausea but she chronically has nausea. Discomfort on the right side of the abdomen has since resolved and no diarrhea..  Denies fever chills vomiting.    Chemistry and CBC: Creat  Date/Time Value Ref Range Status  08/17/2014 04:41 PM 0.73 0.50 - 1.10 mg/dL Final   Creatinine, Ser  Date/Time Value Ref Range Status  08/11/2024 05:42 AM 6.94 (H) 0.44 - 1.00 mg/dL Final  91/74/7974 93:48 AM 7.70 (H) 0.44 - 1.00 mg/dL Final  91/75/7974 89:49 PM 7.38 (H) 0.44 - 1.00 mg/dL Final  98/92/7974 93:92 AM 5.10 (H) 0.44 - 1.00 mg/dL Final  91/92/7975 87:75 PM 6.57 (H) 0.44 - 1.00 mg/dL Final  95/92/7975 96:71 AM 6.07 (H) 0.44 - 1.00 mg/dL Final  95/93/7975 98:72 AM 6.80 (H) 0.44 - 1.00 mg/dL Final  95/94/7975 95:98 PM 6.23 (H) 0.44 -  1.00 mg/dL Final  96/74/7975 96:56 AM 7.39 (H) 0.44 - 1.00 mg/dL Final  97/78/7975 93:99 AM 5.10 (H) 0.44 - 1.00 mg/dL Final  97/78/7975 94:89 AM 4.72 (H) 0.44 - 1.00 mg/dL Final  87/93/7976 98:96 AM 5.57 (H) 0.44 - 1.00 mg/dL Final  87/94/7976 98:60 AM 4.31 (H) 0.44 - 1.00 mg/dL Final  87/95/7976 91:42 PM 4.04 (H) 0.44 - 1.00 mg/dL Final  87/95/7976 93:54 AM 3.33 (H) 0.44 - 1.00 mg/dL Final  87/96/7976 88:83 AM 4.69 (H) 0.44 - 1.00 mg/dL Final  88/79/7976 98:90  AM 4.66 (H) 0.44 - 1.00 mg/dL Final  88/80/7976 96:42 AM 6.89 (H) 0.44 - 1.00 mg/dL Final  88/80/7976 98:82 AM 6.87 (H) 0.44 - 1.00 mg/dL Final  89/89/7976 88:47 AM 4.50 (H) 0.44 - 1.00 mg/dL Final  91/77/7976 91:51 AM 3.40 (H) 0.44 - 1.00 mg/dL Final  93/81/7976 89:59 AM 3.34 (H) 0.44 - 1.00 mg/dL Final  93/85/7976 96:41 AM 2.58 (H) 0.44 - 1.00 mg/dL Final  93/86/7976 91:66 PM 2.42 (H) 0.44 - 1.00 mg/dL Final  95/75/7976 92:75 AM 5.20 (H) 0.44 - 1.00 mg/dL Final  95/78/7976 98:54 AM 5.62 (H) 0.44 - 1.00 mg/dL Final  95/79/7976 98:63 AM 4.54 (H) 0.44 - 1.00 mg/dL Final    Comment:    DELTA CHECK NOTED  04/04/2022 01:36 AM 2.95 (H) 0.44 - 1.00 mg/dL Final  95/81/7976 89:56 AM 3.88 (H) 0.44 - 1.00 mg/dL Final  95/84/7976 87:41 AM 3.22 (H) 0.44 - 1.00 mg/dL Final  95/85/7976 96:69 AM 4.15 (H) 0.44 - 1.00 mg/dL Final  95/86/7976 93:60 PM 3.95 (H) 0.44 - 1.00 mg/dL Final  95/97/7976 95:65 AM 4.60 (H) 0.44 - 1.00 mg/dL Final  96/92/7976 87:83 PM 5.54 (H) 0.44 - 1.00 mg/dL Final  96/93/7976 90:50 AM 5.20 (H) 0.44 - 1.00 mg/dL Final  96/95/7976 92:50 AM 5.03 (H) 0.44 - 1.00 mg/dL Final  96/97/7976 92:55 PM 6.17 (H) 0.44 - 1.00 mg/dL Final  96/97/7976 91:61 AM 7.30 (H) 0.44 - 1.00 mg/dL Final  96/98/7976 95:60 PM 6.20 (H) 0.44 - 1.00 mg/dL Final  96/98/7976 96:99 PM 6.03 (H) 0.44 - 1.00 mg/dL Final  97/78/7976 88:41 AM 5.97 (H) 0.44 - 1.00 mg/dL Final  97/89/7976 87:44 PM 5.59 (H) 0.44 - 1.00 mg/dL Final  97/96/7976 87:64 PM 5.31 (H) 0.44 - 1.00 mg/dL Final  98/79/7976 87:53 PM 4.76 (H) 0.44 - 1.00 mg/dL Final  98/86/7976 97:74 PM 4.27 (H) 0.44 - 1.00 mg/dL Final  98/92/7976 89:99 AM 4.42 (H) 0.44 - 1.00 mg/dL Final  98/92/7976 94:48 AM 4.21 (H) 0.44 - 1.00 mg/dL Final  98/93/7976 88:60 AM 4.20 (H) 0.44 - 1.00 mg/dL Final  87/70/7977 87:66 PM 4.01 (H) 0.44 - 1.00 mg/dL Final  88/71/7977 88:49 AM 3.52 (H) 0.44 - 1.00 mg/dL Final  88/81/7977 88:58 AM 3.54 (H) 0.44 - 1.00 mg/dL Final   88/83/7977 97:61 AM 3.43 (H) 0.44 - 1.00 mg/dL Final   Recent Labs  Lab 08/09/24 2250 08/10/24 0651 08/10/24 1037 08/11/24 0542  NA 133* 132*  --  132*  K 5.3* 5.5*  --  4.2  CL 97* 97*  --  93*  CO2 23 23  --  26  GLUCOSE 172* 180*  --  125*  BUN 39* 41*  --  33*  CREATININE 7.38* 7.70*  --  6.94*  CALCIUM  9.2 9.0  --  8.9  PHOS  --   --  6.2*  --    Recent Labs  Lab 08/09/24 2250 08/10/24 1037 08/10/24  1427 08/11/24 0542 08/12/24 1059 08/13/24 0635  WBC 14.0*  --  14.2* 13.3* 11.6* 11.9*  NEUTROABS 11.0*  --   --  8.9* 8.3* 8.3*  HGB 13.3   < > 12.7 11.9* 11.9* 11.1*  HCT 41.8   < > 40.2 37.8 37.9 35.2*  MCV 94.4  --  94.1 93.8 94.3 93.4  PLT 215  --  212 218 204 190   < > = values in this interval not displayed.   Liver Function Tests: Recent Labs  Lab 08/09/24 2250 08/11/24 0542  AST 17 14*  ALT 18 16  ALKPHOS 93 84  BILITOT 0.7 0.7  PROT 7.5 7.1  ALBUMIN  3.6 3.4*   Recent Labs  Lab 08/09/24 2250  LIPASE 28   No results for input(s): AMMONIA in the last 168 hours. Cardiac Enzymes: No results for input(s): CKTOTAL, CKMB, CKMBINDEX, TROPONINI in the last 168 hours. Iron  Studies: No results for input(s): IRON , TIBC, TRANSFERRIN, FERRITIN in the last 72 hours. PT/INR: @LABRCNTIP (inr:5)  Xrays/Other Studies: ) Results for orders placed or performed during the hospital encounter of 08/09/24 (from the past 48 hours)  Glucose, capillary     Status: Abnormal   Collection Time: 08/11/24 11:34 AM  Result Value Ref Range   Glucose-Capillary 140 (H) 70 - 99 mg/dL    Comment: Glucose reference range applies only to samples taken after fasting for at least 8 hours.   Comment 1 Notify RN   Glucose, capillary     Status: Abnormal   Collection Time: 08/11/24  4:09 PM  Result Value Ref Range   Glucose-Capillary 186 (H) 70 - 99 mg/dL    Comment: Glucose reference range applies only to samples taken after fasting for at least 8 hours.    Comment 1 Notify RN   Glucose, capillary     Status: Abnormal   Collection Time: 08/11/24  9:16 PM  Result Value Ref Range   Glucose-Capillary 210 (H) 70 - 99 mg/dL    Comment: Glucose reference range applies only to samples taken after fasting for at least 8 hours.   Comment 1 Notify RN    Comment 2 Document in Chart   Glucose, capillary     Status: Abnormal   Collection Time: 08/12/24  4:51 AM  Result Value Ref Range   Glucose-Capillary 125 (H) 70 - 99 mg/dL    Comment: Glucose reference range applies only to samples taken after fasting for at least 8 hours.  Glucose, capillary     Status: Abnormal   Collection Time: 08/12/24  8:47 AM  Result Value Ref Range   Glucose-Capillary 160 (H) 70 - 99 mg/dL    Comment: Glucose reference range applies only to samples taken after fasting for at least 8 hours.  CBC with Differential/Platelet     Status: Abnormal   Collection Time: 08/12/24 10:59 AM  Result Value Ref Range   WBC 11.6 (H) 4.0 - 10.5 K/uL   RBC 4.02 3.87 - 5.11 MIL/uL   Hemoglobin 11.9 (L) 12.0 - 15.0 g/dL   HCT 62.0 63.9 - 53.9 %   MCV 94.3 80.0 - 100.0 fL   MCH 29.6 26.0 - 34.0 pg   MCHC 31.4 30.0 - 36.0 g/dL   RDW 85.4 88.4 - 84.4 %   Platelets 204 150 - 400 K/uL   nRBC 0.0 0.0 - 0.2 %   Neutrophils Relative % 71 %   Neutro Abs 8.3 (H) 1.7 - 7.7 K/uL   Lymphocytes Relative 16 %  Lymphs Abs 1.8 0.7 - 4.0 K/uL   Monocytes Relative 10 %   Monocytes Absolute 1.1 (H) 0.1 - 1.0 K/uL   Eosinophils Relative 3 %   Eosinophils Absolute 0.3 0.0 - 0.5 K/uL   Basophils Relative 0 %   Basophils Absolute 0.1 0.0 - 0.1 K/uL   Immature Granulocytes 0 %   Abs Immature Granulocytes 0.04 0.00 - 0.07 K/uL    Comment: Performed at [facility], [address], [place], [place] [address]  Glucose, capillary     Status: Abnormal   Collection Time: 08/12/24 11:52 AM  Result Value Ref Range   Glucose-Capillary 190 (H) 70 - 99 mg/dL    Comment: Glucose reference range applies only  to samples taken after fasting for at least 8 hours.  Glucose, capillary     Status: Abnormal   Collection Time: 08/12/24  4:25 PM  Result Value Ref Range   Glucose-Capillary 235 (H) 70 - 99 mg/dL    Comment: Glucose reference range applies only to samples taken after fasting for at least 8 hours.  Glucose, capillary     Status: Abnormal   Collection Time: 08/12/24  9:12 PM  Result Value Ref Range   Glucose-Capillary 248 (H) 70 - 99 mg/dL    Comment: Glucose reference range applies only to samples taken after fasting for at least 8 hours.  Glucose, capillary     Status: Abnormal   Collection Time: 08/12/24 11:40 PM  Result Value Ref Range   Glucose-Capillary 233 (H) 70 - 99 mg/dL    Comment: Glucose reference range applies only to samples taken after fasting for at least 8 hours.  Glucose, capillary     Status: Abnormal   Collection Time: 08/13/24  3:33 AM  Result Value Ref Range   Glucose-Capillary 177 (H) 70 - 99 mg/dL    Comment: Glucose reference range applies only to samples taken after fasting for at least 8 hours.  CBC with Differential/Platelet     Status: Abnormal   Collection Time: 08/13/24  6:35 AM  Result Value Ref Range   WBC 11.9 (H) 4.0 - 10.5 K/uL   RBC 3.77 (L) 3.87 - 5.11 MIL/uL   Hemoglobin 11.1 (L) 12.0 - 15.0 g/dL   HCT 64.7 (L) 63.9 - 53.9 %   MCV 93.4 80.0 - 100.0 fL   MCH 29.4 26.0 - 34.0 pg   MCHC 31.5 30.0 - 36.0 g/dL   RDW 85.5 88.4 - 84.4 %   Platelets 190 150 - 400 K/uL   nRBC 0.0 0.0 - 0.2 %   Neutrophils Relative % 70 %   Neutro Abs 8.3 (H) 1.7 - 7.7 K/uL   Lymphocytes Relative 15 %   Lymphs Abs 1.8 0.7 - 4.0 K/uL   Monocytes Relative 10 %   Monocytes Absolute 1.2 (H) 0.1 - 1.0 K/uL   Eosinophils Relative 4 %   Eosinophils Absolute 0.4 0.0 - 0.5 K/uL   Basophils Relative 1 %   Basophils Absolute 0.1 0.0 - 0.1 K/uL   Immature Granulocytes 0 %   Abs Immature Granulocytes 0.05 0.00 - 0.07 K/uL    Comment: Performed at [facility],  [address], [place], [place] [address]  Glucose, capillary     Status: Abnormal   Collection Time: 08/13/24  8:55 AM  Result Value Ref Range   Glucose-Capillary 200 (H) 70 - 99 mg/dL    Comment: Glucose reference range applies only to samples taken after fasting for  at least 8 hours.   Comment 1 Notify RN    No results found.   PMH:   Past Medical History:  Diagnosis Date   Anemia 2006   Anginal pain (HCC)    Carotid artery disease (HCC)    right ICA occlusion, 40-59% LICA 02/2021 US    CHF (congestive heart failure) (HCC)    Coronary artery disease    Depression 2014   previously on amitryptiline    Diabetes mellitus with neurological manifestation (HCC) 2006   Diabetic peripheral neuropathy (HCC) 04/26/2020   Dysphagia    Dyspnea    ESRD on hemodialysis (HCC)    Fracture of left ankle 1997   Gastroparesis 07/2016   Heart murmur    HOH (hard of hearing) 2004   Hyperlipidemia 2006   Hypertension 2006   IBS (irritable bowel syndrome) 2002   Leukopenia 2015   Macular degeneration 11/2019   Peripheral vascular disease (HCC)    Shingles 2009   Stroke [facility]) 2020   Thyroid  nodule 2004    PSH:   Past Surgical History:  Procedure Laterality Date   A/V FISTULAGRAM Right 09/25/2022   Procedure: A/V Fistulagram;  Surgeon: Serene Gaile ORN, MD;  Location: [facility] INVASIVE CV LAB;  Service: Cardiovascular;  Laterality: Right;   ABDOMINAL HYSTERECTOMY  2005   AMPUTATION Bilateral 04/04/2022   Procedure: BILATERAL BELOW KNEE AMPUTATION;  Surgeon: Harden Jerona GAILS, MD;  Location: [facility] OR;  Service: Orthopedics;  Laterality: Bilateral;   AV FISTULA PLACEMENT Right 12/22/2021   Procedure: RIGHT ARM ARTERIOVENOUS (AV) BRACIOCPHELAIC FISTULA CREATION;  Surgeon: Sheree Penne Bruckner, MD;  Location: [facility] OR;  Service: Vascular;  Laterality: Right;   AV FISTULA PLACEMENT Right 02/19/2022   Procedure: RIGHT ARTERIOVENOUS FISTULA;  Surgeon: Eliza Bruckner RAMAN, MD;  Location: [facility] OR;  Service:  Vascular;  Laterality: Right;   AV FISTULA PLACEMENT Left 12/24/2023   Procedure: LEFT BRACHIOCEPHALIC ARTERIOVENOUS (AV) FISTULA CREATION;  Surgeon: Lanis Fonda BRAVO, MD;  Location: [facility] OR;  Service: Vascular;  Laterality: Left;   BASCILIC VEIN TRANSPOSITION Right 08/07/2022   Procedure: SECOND STAGE RIGHT BASILIC VEIN TRANSPOSITION;  Surgeon: Eliza Bruckner RAMAN, MD;  Location: [facility] OR;  Service: Vascular;  Laterality: Right;   CATARACT EXTRACTION Left 11/2019   CESAREAN SECTION  1983   CHOLECYSTECTOMY N/A 03/05/2020   Procedure: LAPAROSCOPIC CHOLECYSTECTOMY WITH INTRAOPERATIVE CHOLANGIOGRAM;  Surgeon: Belinda Cough, MD;  Location: [facility] ORS;  Service: General;  Laterality: N/A;   COLONOSCOPY     [facility] said no polyps were found   ESOPHAGOGASTRODUODENOSCOPY (EGD) WITH PROPOFOL  Left 08/26/2014   Procedure: ESOPHAGOGASTRODUODENOSCOPY (EGD) WITH PROPOFOL ;  Surgeon: Elsie Cree, MD;  Location: [facility] ENDOSCOPY;  Service: Endoscopy;  Laterality: Left;   ESOPHAGOGASTRODUODENOSCOPY (EGD) WITH PROPOFOL  N/A 03/03/2020   Procedure: ESOPHAGOGASTRODUODENOSCOPY (EGD) WITH PROPOFOL ;  Surgeon: Albertus Gordy HERO, MD;  Location: [facility] ENDOSCOPY;  Service: Gastroenterology;  Laterality: N/A;   IR FLUORO GUIDE CV LINE RIGHT  02/15/2022   IR US  GUIDE VASC ACCESS RIGHT  02/15/2022   PERIPHERAL VASCULAR BALLOON ANGIOPLASTY Right 09/25/2022   Procedure: PERIPHERAL VASCULAR BALLOON ANGIOPLASTY;  Surgeon: Serene Gaile ORN, MD;  Location: [facility] INVASIVE CV LAB;  Service: Cardiovascular;  Laterality: Right;  AVF   RIGHT HEART CATH N/A 07/14/2021   Procedure: RIGHT HEART CATH;  Surgeon: Rolan Ezra RAMAN, MD;  Location: [facility] INVASIVE CV LAB;  Service: Cardiovascular;  Laterality: N/A;   RIGHT/LEFT HEART CATH AND CORONARY ANGIOGRAPHY N/A 04/27/2021   Procedure: RIGHT/LEFT HEART CATH AND CORONARY ANGIOGRAPHY;  Surgeon: Court Dorn PARAS, MD;  Location: [facility] INVASIVE CV LAB;  Service: Cardiovascular;  Laterality: N/A;    Allergies:   Allergies  Allergen Reactions   Hydralazine  Other (See Comments)   Hydralazine  Hcl Other (See Comments)    Hair loss   Hydrocodone Itching and Other (See Comments)    Upset stomach   Metformin  And Related Nausea And Vomiting and Other (See Comments)    Stomach pains, also   Other Nausea Only and Other (See Comments)    Lettuce- Does not digest this!!   Plaquenil [Hydroxychloroquine Sulfate] Hives   Shellfish Allergy Nausea And Vomiting   Shellfish-Derived Products Nausea Only and Other (See Comments)    Caused an upset stomach   Shrimp (Diagnostic) Nausea Only and Other (See Comments)    Upset stomach    Sulfa Antibiotics Hives   Sulfur  Hives    Medications:   Prior to Admission medications   Medication Sig Start Date End Date Taking? Authorizing Provider  acetaminophen  (TYLENOL ) 500 MG tablet Take 1,000 mg by mouth every 8 (eight) hours as needed for mild pain (pain score 1-3), moderate pain (pain score 4-6) or headache.   Yes [provider]  albuterol  (VENTOLIN  HFA) 108 (90 Base) MCG/ACT inhaler Inhale 2 puffs into the lungs every 6 (six) hours as needed. 08/21/23  Yes [name], MD  ALPRAZolam  (XANAX ) 0.25 MG tablet Take 1 tablet (0.25 mg total) by mouth on Monday, Wednesday and Friday at hemodialysis. 06/10/24  Yes [name], MD  amLODipine  (NORVASC ) 10 MG tablet Take 1 tablet (10 mg total) by mouth daily for hypertension 06/04/24  Yes [name], MD  aspirin  81 MG EC tablet Take 1 tablet (81 mg total) by mouth daily. 09/26/21  Yes Milford, Harlene HERO, FNP  atorvastatin  (LIPITOR) 40 MG tablet Take 1 tablet (40 mg total) by mouth daily for cholesterol. 06/04/24  Yes [name], MD  ezetimibe  (ZETIA ) 10 MG tablet Take 1 tablet (10 mg total) by mouth daily for cholesterol. 06/04/24  Yes [name], MD  insulin  glargine (LANTUS  SOLOSTAR) 100 UNIT/ML Solostar Pen Inject 5 Units into the skin 2 (two) times daily. Increased by Provider on  6/24 06/16/24  Yes [name], MD  isosorbide  mononitrate (IMDUR ) 30 MG 24 hr tablet Take 2 tablets (60 mg total) by mouth daily for blood pressure. 06/04/24  Yes [name], MD  labetalol  (NORMODYNE ) 300 MG tablet Take 1 tablet (300 mg total) by mouth 2 (two) times daily. Patient taking differently: Take 300 mg by mouth daily. Take one tablet by mouth on Tues, Thurs, Saturday, and Sunday in the morning (non-dialysis days). Take one tablet by mouth on Mon, Wed,and Fri in the evening after dialysis. 06/04/24  Yes [name], MD  levothyroxine  (SYNTHROID ) 25 MCG tablet Take 1 tablet (25 mcg total) by mouth daily for low thyroid  hormone. 06/04/24  Yes [name], MD  Menthol, Topical Analgesic, (BIOFREEZE COOL THE PAIN) 4 % GEL Apply 1 Application topically as needed (Shoulder and knee pain.).   Yes [provider]  omeprazole  (PRILOSEC) 20 MG capsule Take 2 capsules (40 mg total) by mouth daily for acid reflux. 07/13/24  Yes [name], MD  ondansetron  (ZOFRAN ) 4 MG tablet Take 1 tablet (4 mg total) by mouth every 8 (eight) hours as needed for nausea. 04/29/24  Yes   polyethylene glycol powder (GLYCOLAX /MIRALAX ) 17 GM/SCOOP powder Mix 1 capful with 8 ounces of fluid and  take by mouth daily as needed. 03/25/24  Yes [name], MD  sevelamer  carbonate (RENVELA ) 800 MG tablet Take 3 tablets (2,400 mg total) by mouth 3 (three) times daily with meals and 1 tablet with snacks. 09/04/23  Yes   Insulin  NPH Isophane & Regular (RELION 70/30 [place]) Inject 35 Units into the skin 2 (two) times daily.  08/17/14  [provider]    Discontinued Meds:   Medications Discontinued During This Encounter  Medication Reason   lactated ringers  bolus 1,000 mL    vancomycin  (VANCOCIN ) capsule 125 mg    fentaNYL  (SUBLIMAZE ) injection 50 mcg    Blood Glucose Monitoring Suppl (ACCU-CHEK GUIDE) w/Device KIT Completed Course   Continuous Glucose Receiver (FREESTYLE LIBRE 3  READER) DEVI    Continuous Glucose Sensor (FREESTYLE LIBRE 3 PLUS SENSOR) MISC    Multiple Vitamin (MULTI-VITAMIN DAILY) TABS Patient Preference   Accu-Chek Softclix Lancets lancets    glucose blood (ACCU-CHEK GUIDE) test strip    Insulin  Pen Needle (PEN NEEDLES) 31G X 8 MM MISC    fentaNYL  (SUBLIMAZE ) injection 25-50 mcg    heparin  injection 1,000 Units Patient Transfer   insulin  glargine (LANTUS ) Solostar Pen 2 Units Duplicate   metroNIDAZOLE  (FLAGYL ) IVPB 500 mg    ciprofloxacin  (CIPRO ) IVPB 400 mg    heparin  injection 5,000 Units     Social History:  reports that she has quit smoking. Her smoking use included cigarettes. She has a 0.1 pack-year smoking history. She has never been exposed to tobacco smoke. She has never used smokeless tobacco. She reports that she does not drink alcohol and does not use drugs.  Family History:   Family History  Problem Relation Age of Onset   Hypertension Mother    Heart disease Mother    Diabetes Mother    Thyroid  disease Mother    Congestive Heart Failure Mother    Breast cancer Maternal Grandmother    Colon cancer Maternal Grandfather    Heart attack Sister    Heart disease Brother    Hyperlipidemia Brother    Hypertension Brother    Diabetes Father    Breast cancer Maternal Aunt     Blood pressure 102/87, pulse 75, temperature (!) 97.5 F (36.4 C), temperature source Oral, resp. rate 15, weight 107.4 kg, SpO2 94%. Physical Exam: General appearance: NAD  Head: NCAT  Neck: trachea midline Back: No CVA tenderness. Resp: CTA  b/l Cardio: regular rate and rhythm GI: Normal tenderness, no rebound Extremities: Bilateral BKA with well-healed wounds, prosthetics off  Access: [facility] TC     MELIA LYNWOOD ORN, MD 08/13/2024, 9:29 AM

## 2024-08-13 NOTE — Progress Notes (Signed)
 Physical Therapy Treatment Patient Details Name: Nancy [name] MRN: [id number] DOB: 10/17/1961 Today's Date: 08/13/2024   History of Present Illness 63 y.o. female presented to the ED with upper abdominal pain that started on Sunday. 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    PT Comments  Pt tolerated treatment well today. Pt today was able to transfer to chair with RW at supervision level. No change in DC/DME recs at this time. PT will continue to follow.     If plan is discharge home, recommend the following: A lot of help with walking and/or transfers;A lot of help with bathing/dressing/bathroom;Assistance with cooking/housework;Direct supervision/assist for medications management;Assist for transportation;Help with stairs or ramp for entrance;Supervision due to cognitive status   Can travel by private vehicle        Equipment Recommendations  None recommended by PT    Recommendations for Other Services       Precautions / Restrictions Precautions Precautions: Fall Recall of Precautions/Restrictions: Intact Restrictions Weight Bearing Restrictions Per Provider Order: No     Mobility  Bed Mobility Overal bed mobility: Needs Assistance Bed Mobility: Supine to Sit     Supine to sit: Supervision, HOB elevated, Used rails     General bed mobility comments: No physical assistance required    Transfers Overall transfer level: Needs assistance Equipment used: Rolling walker (2 wheels) Transfers: Sit to/from Stand, Bed to chair/wheelchair/BSC Sit to Stand: Supervision   Step pivot transfers: Supervision       General transfer comment: no physical assistance required.    Ambulation/Gait               General Gait Details: deferred   Stairs             Wheelchair Mobility     Tilt Bed    Modified Rankin (Stroke Patients Only)        Balance Overall balance assessment: Needs assistance Sitting-balance support: Bilateral upper extremity supported, Feet supported Sitting balance-Leahy Scale: Fair     Standing balance support: Bilateral upper extremity supported, During functional activity Standing balance-Leahy Scale: Poor Standing balance comment: Reliant on RW                            Communication Communication Communication: No apparent difficulties  Cognition Arousal: Alert Behavior During Therapy: WFL for tasks assessed/performed   PT - Cognitive impairments: No apparent impairments                         Following commands: Intact      Cueing Cueing Techniques: Verbal cues, Tactile cues  Exercises      General Comments General comments (skin integrity, edema, etc.): VSS      Pertinent Vitals/Pain Pain Assessment Pain Assessment: No/denies pain    Home Living                          Prior Function            PT Goals (current goals can now be found in the care plan section) Progress towards PT goals: Progressing toward goals    Frequency    Min 2X/week      PT Plan      Co-evaluation              AM-PAC PT  6 Clicks Mobility   Outcome Measure  Help needed turning from your back to your side while in a flat bed without using bedrails?: A Little Help needed moving from lying on your back to sitting on the side of a flat bed without using bedrails?: A Little Help needed moving to and from a bed to a chair (including a wheelchair)?: A Little Help needed standing up from a chair using your arms (e.g., wheelchair or bedside chair)?: A Little Help needed to walk in hospital room?: A Lot Help needed climbing 3-5 steps with a railing? : Total 6 Click Score: 15    End of Session Equipment Utilized During Treatment: Gait belt Activity Tolerance: Patient tolerated treatment well Patient left: in chair;with call bell/phone within reach;with  chair alarm set Nurse Communication: Mobility status PT Visit Diagnosis: Other abnormalities of gait and mobility (R26.89)     Time: 9069-9057 PT Time Calculation (min) (ACUTE ONLY): 12 min  Charges:    $Therapeutic Activity: 8-22 mins PT General Charges $$ ACUTE PT VISIT: 1 Visit                     Sueellen NOVAK, PT, DPT Acute Rehab Services [phone]    Jeremian Whitby 08/13/2024, 9:53 AM

## 2024-08-14 ENCOUNTER — Other Ambulatory Visit (HOSPITAL_COMMUNITY): Payer: Self-pay

## 2024-08-14 ENCOUNTER — Other Ambulatory Visit (HOSPITAL_COMMUNITY): Payer: Self-pay | Admitting: Emergency Medicine

## 2024-08-14 DIAGNOSIS — K529 Noninfective gastroenteritis and colitis, unspecified: Secondary | ICD-10-CM | POA: Diagnosis not present

## 2024-08-14 LAB — CBC WITH DIFFERENTIAL/PLATELET
Abs Immature Granulocytes: 0.06 K/uL (ref 0.00–0.07)
Basophils Absolute: 0.1 K/uL (ref 0.0–0.1)
Basophils Relative: 1 %
Eosinophils Absolute: 0.5 K/uL (ref 0.0–0.5)
Eosinophils Relative: 3 %
HCT: 36.5 % (ref 36.0–46.0)
Hemoglobin: 11.5 g/dL — ABNORMAL LOW (ref 12.0–15.0)
Immature Granulocytes: 0 %
Lymphocytes Relative: 14 %
Lymphs Abs: 2 K/uL (ref 0.7–4.0)
MCH: 29.6 pg (ref 26.0–34.0)
MCHC: 31.5 g/dL (ref 30.0–36.0)
MCV: 93.8 fL (ref 80.0–100.0)
Monocytes Absolute: 1.1 K/uL — ABNORMAL HIGH (ref 0.1–1.0)
Monocytes Relative: 8 %
Neutro Abs: 10.2 K/uL — ABNORMAL HIGH (ref 1.7–7.7)
Neutrophils Relative %: 74 %
Platelets: 195 K/uL (ref 150–400)
RBC: 3.89 MIL/uL (ref 3.87–5.11)
RDW: 14.1 % (ref 11.5–15.5)
WBC: 13.9 K/uL — ABNORMAL HIGH (ref 4.0–10.5)
nRBC: 0 % (ref 0.0–0.2)

## 2024-08-14 LAB — GLUCOSE, CAPILLARY
Glucose-Capillary: 157 mg/dL — ABNORMAL HIGH (ref 70–99)
Glucose-Capillary: 168 mg/dL — ABNORMAL HIGH (ref 70–99)
Glucose-Capillary: 189 mg/dL — ABNORMAL HIGH (ref 70–99)

## 2024-08-14 MED ORDER — CEPHALEXIN 500 MG PO CAPS
500.0000 mg | ORAL_CAPSULE | Freq: Two times a day (BID) | ORAL | Status: DC
  Administered 2024-08-14: 500 mg via ORAL
  Filled 2024-08-14: qty 1

## 2024-08-14 MED ORDER — CEPHALEXIN 500 MG PO CAPS: 500.0000 mg | ORAL_CAPSULE | Freq: Two times a day (BID) | ORAL | Status: DC

## 2024-08-14 MED ORDER — HYDROMORPHONE HCL 1 MG/ML IJ SOLN
INTRAMUSCULAR | Status: AC
  Filled 2024-08-14: qty 0.5

## 2024-08-14 MED ORDER — METRONIDAZOLE 500 MG PO TABS
500.0000 mg | ORAL_TABLET | Freq: Two times a day (BID) | ORAL | Status: DC
  Administered 2024-08-14: 500 mg via ORAL
  Filled 2024-08-14: qty 1

## 2024-08-14 MED ORDER — METRONIDAZOLE 500 MG PO TABS
500.0000 mg | ORAL_TABLET | Freq: Two times a day (BID) | ORAL | 0 refills | Status: DC
  Filled 2024-08-14: qty 10, 5d supply, fill #0

## 2024-08-14 MED ORDER — CEPHALEXIN 500 MG PO CAPS
500.0000 mg | ORAL_CAPSULE | Freq: Two times a day (BID) | ORAL | 0 refills | Status: DC
  Filled 2024-08-14: qty 10, 5d supply, fill #0

## 2024-08-14 NOTE — Progress Notes (Signed)
Patient arrived back to unit.

## 2024-08-14 NOTE — Progress Notes (Signed)
   08/14/24 1305  Vitals  Temp (!) 97.5 F (36.4 C)  Pulse Rate 74  Resp 17  BP 124/71  SpO2 97 %  Weight 105.3 kg  Type of Weight Post-Dialysis  Post Treatment  Dialyzer Clearance Lightly streaked  Hemodialysis Intake (mL) 0 mL  Liters Processed 94.7  Fluid Removed (mL) 2300 mL  Tolerated HD Treatment Yes   Received patient in bed to unit.  Alert and oriented.  Informed consent signed and in chart.   TX duration:4hrs  Patient tolerated well.  Transported back to the room  Alert, without acute distress.  Hand-off given to patient's nurse.   Access used: [facility] Access issues: NONE  Total UF removed: 2.3L Medication(s) given: NONE   [facility]amin[facility] T [facility]

## 2024-08-14 NOTE — Progress Notes (Signed)
Patient off of unit for dialysis.

## 2024-08-14 NOTE — Progress Notes (Signed)
 D/C order noted. Contacted [facility] on [address] to be advised of pt's d/c today and that pt should resume care on Monday.   Randine Mungo Dialysis Navigator [phone]

## 2024-08-14 NOTE — Plan of Care (Signed)
  Problem: Education: Goal: Ability to describe self-care measures that may prevent or decrease complications (Diabetes Survival Skills Education) will improve Outcome: Progressing   Problem: Health Behavior/Discharge Planning: Goal: Ability to manage health-related needs will improve Outcome: Progressing   Problem: Metabolic: Goal: Ability to maintain appropriate glucose levels will improve Outcome: Progressing   Problem: Education: Goal: Knowledge of General Education information will improve Description: Including pain rating scale, medication(s)/side effects and non-pharmacologic comfort measures Outcome: Progressing

## 2024-08-14 NOTE — Progress Notes (Signed)
 Nancy [name] is an 63 y.o. female with carotid artery stenosis, CASGD, DM HTN, HLD, diabetic gastroparesis, PAD, CVA; dialyzes MWF at [facility] with last treatment on 8/22 for full treatment of 4 hours of 15 minutes leaving at 107.2 kg, her EDW is 106.7 kg and she has reached in the past 3 treatments. P/W abdominal pain, vomiting and diarrhea with bright red blood per rectum as well.  Has had chronic abd pain.   HD Orders ([facility]) MWF [facility] 4h43min EDW 106.7kg (last HD 8/22 107.2kg) CVC, no active Mircera (last 50mcg given 7/30) Calcitriol  2mcg PO TID Sensipar  30mg  three times per week Heparin  5000 units  Assessment/Plan: ESRD -Patient is a very compliant patient and last treatment was on Friday for a full treatment leaving very close to her dry weight.  She has reached her dry weight at least twice in the past 3 treatments.   - On for dialysis today; from renal standpoint stable for discharge   Additional recommendations - Dose all meds for creatinine clearance < 10 ml/min  - Unless absolutely necessary, no MRIs with gadolinium.  - Prefer needle sticks in the dorsum of the hands or wrists.  No blood pressure measurements in right arm. - If blood transfusion is requested during hemodialysis sessions, please alert us  prior to the session.  - If a hemodialysis catheter line culture is requested, please alert us  as only hemodialysis nurses are able to collect those specimens.    Avoid nephrotoxic medications including NSAIDs and iodinated intravenous contrast exposure unless the latter is absolutely indicated.  Preferred narcotic agents for pain control are hydromorphone , fentanyl , and methadone. Morphine  should not be used. Avoid Baclofen and avoid oral sodium phosphate  and magnesium  citrate based laxatives / bowel preps. Continue strict Input and Output monitoring. Will monitor the patient closely with you and intervene or adjust therapy as indicated by changes in clinical status/labs    Renal  osteodystrophy - phosphorus elevated; restarted renvela  at 3 tabs TIDM.   Anemia -Last Mircera was given 7/30 and she has been taken off protocol as her hemoglobin has been above 12. Colitis w/ diarrhea -seen by GI, C. difficile and GI pathogen with eventual colonoscopy recommended DM - per primary HTN -start home regiment.  Ultrafiltration will help as well.  Subjective: She feels better, denies any more hematochezia; some nausea but she chronically has nausea.  She did not have any nausea this morning.  Denies fever chills vomiting.    Chemistry and CBC: Creat  Date/Time Value Ref Range Status  08/17/2014 04:41 PM 0.73 0.50 - 1.10 mg/dL Final   Creatinine, Ser  Date/Time Value Ref Range Status  08/11/2024 05:42 AM 6.94 (H) 0.44 - 1.00 mg/dL Final  91/74/7974 93:48 AM 7.70 (H) 0.44 - 1.00 mg/dL Final  91/75/7974 89:49 PM 7.38 (H) 0.44 - 1.00 mg/dL Final  98/92/7974 93:92 AM 5.10 (H) 0.44 - 1.00 mg/dL Final  91/92/7975 87:75 PM 6.57 (H) 0.44 - 1.00 mg/dL Final  95/92/7975 96:71 AM 6.07 (H) 0.44 - 1.00 mg/dL Final  95/93/7975 98:72 AM 6.80 (H) 0.44 - 1.00 mg/dL Final  95/94/7975 95:98 PM 6.23 (H) 0.44 - 1.00 mg/dL Final  96/74/7975 96:56 AM 7.39 (H) 0.44 - 1.00 mg/dL Final  97/78/7975 93:99 AM 5.10 (H) 0.44 - 1.00 mg/dL Final  97/78/7975 94:89 AM 4.72 (H) 0.44 - 1.00 mg/dL Final  87/93/7976 98:96 AM 5.57 (H) 0.44 - 1.00 mg/dL Final  87/94/7976 98:60 AM 4.31 (H) 0.44 - 1.00 mg/dL Final  87/95/7976 91:42  PM 4.04 (H) 0.44 - 1.00 mg/dL Final  87/95/7976 93:54 AM 3.33 (H) 0.44 - 1.00 mg/dL Final  87/96/7976 88:83 AM 4.69 (H) 0.44 - 1.00 mg/dL Final  88/79/7976 98:90 AM 4.66 (H) 0.44 - 1.00 mg/dL Final  88/80/7976 96:42 AM 6.89 (H) 0.44 - 1.00 mg/dL Final  88/80/7976 98:82 AM 6.87 (H) 0.44 - 1.00 mg/dL Final  89/89/7976 88:47 AM 4.50 (H) 0.44 - 1.00 mg/dL Final  91/77/7976 91:51 AM 3.40 (H) 0.44 - 1.00 mg/dL Final  93/81/7976 89:59 AM 3.34 (H) 0.44 - 1.00 mg/dL Final  93/85/7976 96:41  AM 2.58 (H) 0.44 - 1.00 mg/dL Final  93/86/7976 91:66 PM 2.42 (H) 0.44 - 1.00 mg/dL Final  95/75/7976 92:75 AM 5.20 (H) 0.44 - 1.00 mg/dL Final  95/78/7976 98:54 AM 5.62 (H) 0.44 - 1.00 mg/dL Final  95/79/7976 98:63 AM 4.54 (H) 0.44 - 1.00 mg/dL Final    Comment:    DELTA CHECK NOTED  04/04/2022 01:36 AM 2.95 (H) 0.44 - 1.00 mg/dL Final  95/81/7976 89:56 AM 3.88 (H) 0.44 - 1.00 mg/dL Final  95/84/7976 87:41 AM 3.22 (H) 0.44 - 1.00 mg/dL Final  95/85/7976 96:69 AM 4.15 (H) 0.44 - 1.00 mg/dL Final  95/86/7976 93:60 PM 3.95 (H) 0.44 - 1.00 mg/dL Final  95/97/7976 95:65 AM 4.60 (H) 0.44 - 1.00 mg/dL Final  96/92/7976 87:83 PM 5.54 (H) 0.44 - 1.00 mg/dL Final  96/93/7976 90:50 AM 5.20 (H) 0.44 - 1.00 mg/dL Final  96/95/7976 92:50 AM 5.03 (H) 0.44 - 1.00 mg/dL Final  96/97/7976 92:55 PM 6.17 (H) 0.44 - 1.00 mg/dL Final  96/97/7976 91:61 AM 7.30 (H) 0.44 - 1.00 mg/dL Final  96/98/7976 95:60 PM 6.20 (H) 0.44 - 1.00 mg/dL Final  96/98/7976 96:99 PM 6.03 (H) 0.44 - 1.00 mg/dL Final  97/78/7976 88:41 AM 5.97 (H) 0.44 - 1.00 mg/dL Final  97/89/7976 87:44 PM 5.59 (H) 0.44 - 1.00 mg/dL Final  97/96/7976 87:64 PM 5.31 (H) 0.44 - 1.00 mg/dL Final  98/79/7976 87:53 PM 4.76 (H) 0.44 - 1.00 mg/dL Final  98/86/7976 97:74 PM 4.27 (H) 0.44 - 1.00 mg/dL Final  98/92/7976 89:99 AM 4.42 (H) 0.44 - 1.00 mg/dL Final  98/92/7976 94:48 AM 4.21 (H) 0.44 - 1.00 mg/dL Final  98/93/7976 88:60 AM 4.20 (H) 0.44 - 1.00 mg/dL Final  87/70/7977 87:66 PM 4.01 (H) 0.44 - 1.00 mg/dL Final  88/71/7977 88:49 AM 3.52 (H) 0.44 - 1.00 mg/dL Final  88/81/7977 88:58 AM 3.54 (H) 0.44 - 1.00 mg/dL Final  88/83/7977 97:61 AM 3.43 (H) 0.44 - 1.00 mg/dL Final   Recent Labs  Lab 08/09/24 2250 08/10/24 0651 08/10/24 1037 08/11/24 0542  NA 133* 132*  --  132*  K 5.3* 5.5*  --  4.2  CL 97* 97*  --  93*  CO2 23 23  --  26  GLUCOSE 172* 180*  --  125*  BUN 39* 41*  --  33*  CREATININE 7.38* 7.70*  --  6.94*  CALCIUM  9.2 9.0  --   8.9  PHOS  --   --  6.2*  --    Recent Labs  Lab 08/11/24 0542 08/12/24 1059 08/13/24 0635 08/14/24 0552  WBC 13.3* 11.6* 11.9* 13.9*  NEUTROABS 8.9* 8.3* 8.3* 10.2*  HGB 11.9* 11.9* 11.1* 11.5*  HCT 37.8 37.9 35.2* 36.5  MCV 93.8 94.3 93.4 93.8  PLT 218 204 190 195   Liver Function Tests: Recent Labs  Lab 08/09/24 2250 08/11/24 0542  AST 17 14*  ALT 18 16  ALKPHOS 93 84  BILITOT 0.7 0.7  PROT 7.5 7.1  ALBUMIN  3.6 3.4*   Recent Labs  Lab 08/09/24 2250  LIPASE 28   No results for input(s): AMMONIA in the last 168 hours. Cardiac Enzymes: No results for input(s): CKTOTAL, CKMB, CKMBINDEX, TROPONINI in the last 168 hours. Iron  Studies: No results for input(s): IRON , TIBC, TRANSFERRIN, FERRITIN in the last 72 hours. PT/INR: @LABRCNTIP (inr:5)  Xrays/Other Studies: ) Results for orders placed or performed during the hospital encounter of 08/09/24 (from the past 48 hours)  Glucose, capillary     Status: Abnormal   Collection Time: 08/12/24  8:47 AM  Result Value Ref Range   Glucose-Capillary 160 (H) 70 - 99 mg/dL    Comment: Glucose reference range applies only to samples taken after fasting for at least 8 hours.  CBC with Differential/Platelet     Status: Abnormal   Collection Time: 08/12/24 10:59 AM  Result Value Ref Range   WBC 11.6 (H) 4.0 - 10.5 K/uL   RBC 4.02 3.87 - 5.11 MIL/uL   Hemoglobin 11.9 (L) 12.0 - 15.0 g/dL   HCT 62.0 63.9 - 53.9 %   MCV 94.3 80.0 - 100.0 fL   MCH 29.6 26.0 - 34.0 pg   MCHC 31.4 30.0 - 36.0 g/dL   RDW 85.4 88.4 - 84.4 %   Platelets 204 150 - 400 K/uL   nRBC 0.0 0.0 - 0.2 %   Neutrophils Relative % 71 %   Neutro Abs 8.3 (H) 1.7 - 7.7 K/uL   Lymphocytes Relative 16 %   Lymphs Abs 1.8 0.7 - 4.0 K/uL   Monocytes Relative 10 %   Monocytes Absolute 1.1 (H) 0.1 - 1.0 K/uL   Eosinophils Relative 3 %   Eosinophils Absolute 0.3 0.0 - 0.5 K/uL   Basophils Relative 0 %   Basophils Absolute 0.1 0.0 - 0.1 K/uL    Immature Granulocytes 0 %   Abs Immature Granulocytes 0.04 0.00 - 0.07 K/uL    Comment: Performed at [facility], [address], [place], [place] [address]  Glucose, capillary     Status: Abnormal   Collection Time: 08/12/24 11:52 AM  Result Value Ref Range   Glucose-Capillary 190 (H) 70 - 99 mg/dL    Comment: Glucose reference range applies only to samples taken after fasting for at least 8 hours.  Glucose, capillary     Status: Abnormal   Collection Time: 08/12/24  4:25 PM  Result Value Ref Range   Glucose-Capillary 235 (H) 70 - 99 mg/dL    Comment: Glucose reference range applies only to samples taken after fasting for at least 8 hours.  Glucose, capillary     Status: Abnormal   Collection Time: 08/12/24  9:12 PM  Result Value Ref Range   Glucose-Capillary 248 (H) 70 - 99 mg/dL    Comment: Glucose reference range applies only to samples taken after fasting for at least 8 hours.  Glucose, capillary     Status: Abnormal   Collection Time: 08/12/24 11:40 PM  Result Value Ref Range   Glucose-Capillary 233 (H) 70 - 99 mg/dL    Comment: Glucose reference range applies only to samples taken after fasting for at least 8 hours.  Glucose, capillary     Status: Abnormal   Collection Time: 08/13/24  3:33 AM  Result Value Ref Range   Glucose-Capillary 177 (H) 70 - 99 mg/dL    Comment: Glucose reference range applies only to samples taken after fasting  for at least 8 hours.  CBC with Differential/Platelet     Status: Abnormal   Collection Time: 08/13/24  6:35 AM  Result Value Ref Range   WBC 11.9 (H) 4.0 - 10.5 K/uL   RBC 3.77 (L) 3.87 - 5.11 MIL/uL   Hemoglobin 11.1 (L) 12.0 - 15.0 g/dL   HCT 64.7 (L) 63.9 - 53.9 %   MCV 93.4 80.0 - 100.0 fL   MCH 29.4 26.0 - 34.0 pg   MCHC 31.5 30.0 - 36.0 g/dL   RDW 85.5 88.4 - 84.4 %   Platelets 190 150 - 400 K/uL   nRBC 0.0 0.0 - 0.2 %   Neutrophils Relative % 70 %   Neutro Abs 8.3 (H) 1.7 - 7.7 K/uL   Lymphocytes Relative 15 %   Lymphs  Abs 1.8 0.7 - 4.0 K/uL   Monocytes Relative 10 %   Monocytes Absolute 1.2 (H) 0.1 - 1.0 K/uL   Eosinophils Relative 4 %   Eosinophils Absolute 0.4 0.0 - 0.5 K/uL   Basophils Relative 1 %   Basophils Absolute 0.1 0.0 - 0.1 K/uL   Immature Granulocytes 0 %   Abs Immature Granulocytes 0.05 0.00 - 0.07 K/uL    Comment: Performed at [facility], [address] [place], [place] [address]  Glucose, capillary     Status: Abnormal   Collection Time: 08/13/24  8:55 AM  Result Value Ref Range   Glucose-Capillary 200 (H) 70 - 99 mg/dL    Comment: Glucose reference range applies only to samples taken after fasting for at least 8 hours.   Comment 1 Notify RN   Glucose, capillary     Status: Abnormal   Collection Time: 08/13/24 12:05 PM  Result Value Ref Range   Glucose-Capillary 193 (H) 70 - 99 mg/dL    Comment: Glucose reference range applies only to samples taken after fasting for at least 8 hours.   Comment 1 Notify RN   Glucose, capillary     Status: Abnormal   Collection Time: 08/13/24  4:44 PM  Result Value Ref Range   Glucose-Capillary 218 (H) 70 - 99 mg/dL    Comment: Glucose reference range applies only to samples taken after fasting for at least 8 hours.  Glucose, capillary     Status: Abnormal   Collection Time: 08/13/24  7:44 PM  Result Value Ref Range   Glucose-Capillary 226 (H) 70 - 99 mg/dL    Comment: Glucose reference range applies only to samples taken after fasting for at least 8 hours.  Glucose, capillary     Status: Abnormal   Collection Time: 08/14/24 12:05 AM  Result Value Ref Range   Glucose-Capillary 189 (H) 70 - 99 mg/dL    Comment: Glucose reference range applies only to samples taken after fasting for at least 8 hours.  CBC with Differential/Platelet     Status: Abnormal   Collection Time: 08/14/24  5:52 AM  Result Value Ref Range   WBC 13.9 (H) 4.0 - 10.5 K/uL   RBC 3.89 3.87 - 5.11 MIL/uL   Hemoglobin 11.5 (L) 12.0 - 15.0 g/dL   HCT 63.4 63.9 - 53.9  %   MCV 93.8 80.0 - 100.0 fL   MCH 29.6 26.0 - 34.0 pg   MCHC 31.5 30.0 - 36.0 g/dL   RDW 85.8 88.4 - 84.4 %   Platelets 195 150 - 400 K/uL   nRBC 0.0 0.0 - 0.2 %   Neutrophils Relative % 74 %  Neutro Abs 10.2 (H) 1.7 - 7.7 K/uL   Lymphocytes Relative 14 %   Lymphs Abs 2.0 0.7 - 4.0 K/uL   Monocytes Relative 8 %   Monocytes Absolute 1.1 (H) 0.1 - 1.0 K/uL   Eosinophils Relative 3 %   Eosinophils Absolute 0.5 0.0 - 0.5 K/uL   Basophils Relative 1 %   Basophils Absolute 0.1 0.0 - 0.1 K/uL   Immature Granulocytes 0 %   Abs Immature Granulocytes 0.06 0.00 - 0.07 K/uL    Comment: Performed at [facility], [address] [place], [place] [address]  Glucose, capillary     Status: Abnormal   Collection Time: 08/14/24  6:09 AM  Result Value Ref Range   Glucose-Capillary 168 (H) 70 - 99 mg/dL    Comment: Glucose reference range applies only to samples taken after fasting for at least 8 hours.   No results found.   PMH:   Past Medical History:  Diagnosis Date   Anemia 2006   Anginal pain (HCC)    Carotid artery disease (HCC)    right ICA occlusion, 40-59% LICA 02/2021 US    CHF (congestive heart failure) (HCC)    Coronary artery disease    Depression 2014   previously on amitryptiline    Diabetes mellitus with neurological manifestation (HCC) 2006   Diabetic peripheral neuropathy (HCC) 04/26/2020   Dysphagia    Dyspnea    ESRD on hemodialysis (HCC)    Fracture of left ankle 1997   Gastroparesis 07/2016   Heart murmur    HOH (hard of hearing) 2004   Hyperlipidemia 2006   Hypertension 2006   IBS (irritable bowel syndrome) 2002   Leukopenia 2015   Macular degeneration 11/2019   Peripheral vascular disease (HCC)    Shingles 2009   Stroke [facility]) 2020   Thyroid  nodule 2004    PSH:   Past Surgical History:  Procedure Laterality Date   A/V FISTULAGRAM Right 09/25/2022   Procedure: A/V Fistulagram;  Surgeon: Serene Gaile ORN, MD;  Location: [facility] INVASIVE CV LAB;   Service: Cardiovascular;  Laterality: Right;   ABDOMINAL HYSTERECTOMY  2005   AMPUTATION Bilateral 04/04/2022   Procedure: BILATERAL BELOW KNEE AMPUTATION;  Surgeon: Harden Jerona GAILS, MD;  Location: [facility] OR;  Service: Orthopedics;  Laterality: Bilateral;   AV FISTULA PLACEMENT Right 12/22/2021   Procedure: RIGHT ARM ARTERIOVENOUS (AV) BRACIOCPHELAIC FISTULA CREATION;  Surgeon: Sheree Penne Bruckner, MD;  Location: [facility] OR;  Service: Vascular;  Laterality: Right;   AV FISTULA PLACEMENT Right 02/19/2022   Procedure: RIGHT ARTERIOVENOUS FISTULA;  Surgeon: Eliza Bruckner RAMAN, MD;  Location: [facility] OR;  Service: Vascular;  Laterality: Right;   AV FISTULA PLACEMENT Left 12/24/2023   Procedure: LEFT BRACHIOCEPHALIC ARTERIOVENOUS (AV) FISTULA CREATION;  Surgeon: Lanis Fonda BRAVO, MD;  Location: [facility] OR;  Service: Vascular;  Laterality: Left;   BASCILIC VEIN TRANSPOSITION Right 08/07/2022   Procedure: SECOND STAGE RIGHT BASILIC VEIN TRANSPOSITION;  Surgeon: Eliza Bruckner RAMAN, MD;  Location: [facility] OR;  Service: Vascular;  Laterality: Right;   CATARACT EXTRACTION Left 11/2019   CESAREAN SECTION  1983   CHOLECYSTECTOMY N/A 03/05/2020   Procedure: LAPAROSCOPIC CHOLECYSTECTOMY WITH INTRAOPERATIVE CHOLANGIOGRAM;  Surgeon: Belinda Cough, MD;  Location: [facility] ORS;  Service: General;  Laterality: N/A;   COLONOSCOPY     [facility] said no polyps were found   ESOPHAGOGASTRODUODENOSCOPY (EGD) WITH PROPOFOL  Left 08/26/2014   Procedure: ESOPHAGOGASTRODUODENOSCOPY (EGD) WITH PROPOFOL ;  Surgeon: Elsie Cree, MD;  Location: [facility] ENDOSCOPY;  Service:  Endoscopy;  Laterality: Left;   ESOPHAGOGASTRODUODENOSCOPY (EGD) WITH PROPOFOL  N/A 03/03/2020   Procedure: ESOPHAGOGASTRODUODENOSCOPY (EGD) WITH PROPOFOL ;  Surgeon: Albertus Gordy HERO, MD;  Location: [facility] ENDOSCOPY;  Service: Gastroenterology;  Laterality: N/A;   IR FLUORO GUIDE CV LINE RIGHT  02/15/2022   IR US  GUIDE VASC ACCESS RIGHT  02/15/2022   PERIPHERAL VASCULAR BALLOON  ANGIOPLASTY Right 09/25/2022   Procedure: PERIPHERAL VASCULAR BALLOON ANGIOPLASTY;  Surgeon: Serene Gaile ORN, MD;  Location: [facility];  Service: Cardiovascular;  Laterality: Right;  AVF   RIGHT HEART CATH N/A 07/14/2021   Procedure: RIGHT HEART CATH;  Surgeon: Rolan Ezra RAMAN, MD;  Location: [facility] INVASIVE CV LAB;  Service: Cardiovascular;  Laterality: N/A;   RIGHT/LEFT HEART CATH AND CORONARY ANGIOGRAPHY N/A 04/27/2021   Procedure: RIGHT/LEFT HEART CATH AND CORONARY ANGIOGRAPHY;  Surgeon: Court Dorn PARAS, MD;  Location: [facility];  Service: Cardiovascular;  Laterality: N/A;    Allergies:  Allergies  Allergen Reactions   Hydralazine  Other (See Comments)   Hydralazine  Hcl Other (See Comments)    Hair loss   Hydrocodone Itching and Other (See Comments)    Upset stomach   Metformin  And Related Nausea And Vomiting and Other (See Comments)    Stomach pains, also   Other Nausea Only and Other (See Comments)    Lettuce- Does not digest this!!   Plaquenil [Hydroxychloroquine Sulfate] Hives   Shellfish Allergy Nausea And Vomiting   Shellfish-Derived Products Nausea Only and Other (See Comments)    Caused an upset stomach   Shrimp (Diagnostic) Nausea Only and Other (See Comments)    Upset stomach    Sulfa Antibiotics Hives   Sulfur  Hives    Medications:   Prior to Admission medications   Medication Sig Start Date End Date Taking? Authorizing Provider  acetaminophen  (TYLENOL ) 500 MG tablet Take 1,000 mg by mouth every 8 (eight) hours as needed for mild pain (pain score 1-3), moderate pain (pain score 4-6) or headache.   Yes [provider]  albuterol  (VENTOLIN  HFA) 108 (90 Base) MCG/ACT inhaler Inhale 2 puffs into the lungs every 6 (six) hours as needed. 08/21/23  Yes [name], MD  ALPRAZolam  (XANAX ) 0.25 MG tablet Take 1 tablet (0.25 mg total) by mouth on Monday, Wednesday and Friday at hemodialysis. 06/10/24  Yes [name], MD  amLODipine   (NORVASC ) 10 MG tablet Take 1 tablet (10 mg total) by mouth daily for hypertension 06/04/24  Yes [name], MD  aspirin  81 MG EC tablet Take 1 tablet (81 mg total) by mouth daily. 09/26/21  Yes Milford, Harlene HERO, FNP  atorvastatin  (LIPITOR) 40 MG tablet Take 1 tablet (40 mg total) by mouth daily for cholesterol. 06/04/24  Yes [name], MD  ezetimibe  (ZETIA ) 10 MG tablet Take 1 tablet (10 mg total) by mouth daily for cholesterol. 06/04/24  Yes [name], MD  insulin  glargine (LANTUS  SOLOSTAR) 100 UNIT/ML Solostar Pen Inject 5 Units into the skin 2 (two) times daily. Increased by Provider on 6/24 06/16/24  Yes [name], MD  isosorbide  mononitrate (IMDUR ) 30 MG 24 hr tablet Take 2 tablets (60 mg total) by mouth daily for blood pressure. 06/04/24  Yes [name], MD  labetalol  (NORMODYNE ) 300 MG tablet Take 1 tablet (300 mg total) by mouth 2 (two) times daily. Patient taking differently: Take 300 mg by mouth daily. Take one tablet by mouth on Tues, Thurs, Saturday, and Sunday in the morning (non-dialysis days). Take  one tablet by mouth on Mon, Wed,and Fri in the evening after dialysis. 06/04/24  Yes [name], MD  levothyroxine  (SYNTHROID ) 25 MCG tablet Take 1 tablet (25 mcg total) by mouth daily for low thyroid  hormone. 06/04/24  Yes [name], MD  Menthol, Topical Analgesic, (BIOFREEZE COOL THE PAIN) 4 % GEL Apply 1 Application topically as needed (Shoulder and knee pain.).   Yes [provider]  omeprazole  (PRILOSEC) 20 MG capsule Take 2 capsules (40 mg total) by mouth daily for acid reflux. 07/13/24  Yes [name], MD  ondansetron  (ZOFRAN ) 4 MG tablet Take 1 tablet (4 mg total) by mouth every 8 (eight) hours as needed for nausea. 04/29/24  Yes   polyethylene glycol powder (GLYCOLAX /MIRALAX ) 17 GM/SCOOP powder Mix 1 capful with 8 ounces of fluid and take by mouth daily as needed. 03/25/24  Yes [name], MD  sevelamer   carbonate (RENVELA ) 800 MG tablet Take 3 tablets (2,400 mg total) by mouth 3 (three) times daily with meals and 1 tablet with snacks. 09/04/23  Yes   Insulin  NPH Isophane & Regular (RELION 70/30 [place]) Inject 35 Units into the skin 2 (two) times daily.  08/17/14  [provider]    Discontinued Meds:   Medications Discontinued During This Encounter  Medication Reason   lactated ringers  bolus 1,000 mL    vancomycin  (VANCOCIN ) capsule 125 mg    fentaNYL  (SUBLIMAZE ) injection 50 mcg    Blood Glucose Monitoring Suppl (ACCU-CHEK GUIDE) w/Device KIT Completed Course   Continuous Glucose Receiver (FREESTYLE LIBRE 3 READER) DEVI    Continuous Glucose Sensor (FREESTYLE LIBRE 3 PLUS SENSOR) MISC    Multiple Vitamin (MULTI-VITAMIN DAILY) TABS Patient Preference   Accu-Chek Softclix Lancets lancets    glucose blood (ACCU-CHEK GUIDE) test strip    Insulin  Pen Needle (PEN NEEDLES) 31G X 8 MM MISC    fentaNYL  (SUBLIMAZE ) injection 25-50 mcg    heparin  injection 1,000 Units Patient Transfer   insulin  glargine (LANTUS ) Solostar Pen 2 Units Duplicate   metroNIDAZOLE  (FLAGYL ) IVPB 500 mg    ciprofloxacin  (CIPRO ) IVPB 400 mg    heparin  injection 5,000 Units     Social History:  reports that she quit smoking about 49 years ago. Her smoking use included cigarettes. She started smoking about 43 years ago. She has a 22 pack-year smoking history. She has never been exposed to tobacco smoke. She has never used smokeless tobacco. She reports that she does not drink alcohol and does not use drugs.  Family History:   Family History  Problem Relation Age of Onset   Hypertension Mother    Heart disease Mother    Diabetes Mother    Thyroid  disease Mother    Congestive Heart Failure Mother    Breast cancer Maternal Grandmother    Colon cancer Maternal Grandfather    Heart attack Sister    Heart disease Brother    Hyperlipidemia Brother    Hypertension Brother    Diabetes Father    Breast cancer Maternal  Aunt     Blood pressure (!) 166/85, pulse 79, temperature 98.4 F (36.9 C), temperature source Oral, resp. rate 11, height 5' 3 (1.6 m), weight 106.7 kg, SpO2 99%. Physical Exam: General appearance: NAD  Head: NCAT  Neck: trachea midline Back: No CVA tenderness. Resp: CTA  b/l Cardio: regular rate and rhythm GI: Normal tenderness, no rebound Extremities: Bilateral BKA with well-healed wounds, prosthetics off  Access: [facility] TC   Jorden Minchey, LYNWOOD ORN,  MD 08/14/2024, 7:30 AM

## 2024-08-14 NOTE — Discharge Instructions (Addendum)
 Follow with Primary MD [name], MD in 7 days   Get CBC, CMP, Magnesium   -  checked next visit with your primary MD   Activity: As tolerated with Full fall precautions use walker/cane & assistance as needed  Disposition Home    Diet: Renal-low carbohydrate diet, strict 1.5 L fluid restriction per day.  Check CBGs q. ACHS.  Special Instructions: If you have smoked or chewed Tobacco  in the last 2 yrs please stop smoking, stop any regular Alcohol  and or any Recreational drug use.  On your next visit with your primary care physician please Get Medicines reviewed and adjusted.  Please request your Prim.MD to go over all Hospital Tests and Procedure/Radiological results at the follow up, please get all Hospital records sent to your Prim MD by signing hospital release before you go home.  If you experience worsening of your admission symptoms, develop shortness of breath, life threatening emergency, suicidal or homicidal thoughts you must seek medical attention immediately by calling 911 or calling your MD immediately  if symptoms less severe.  You Must read complete instructions/literature along with all the possible adverse reactions/side effects for all the Medicines you take and that have been prescribed to you. Take any new Medicines after you have completely understood and accpet all the possible adverse reactions/side effects.   Do not drive when taking Pain medications.  Do not take more than prescribed Pain, Sleep and Anxiety Medications  Wear Seat belts while driving.

## 2024-08-14 NOTE — Discharge Summary (Signed)
 Nancy [name] FMW:[id number] DOB: March 28, 1961 DOA: 08/09/2024  PCP: Nancy Barnie NOVAK, MD  Admit date: 08/09/2024  Discharge date: 08/14/2024  Admitted From: Home   Disposition:  Home   Recommendations for Outpatient Follow-up:   Follow up with PCP in 1-2 weeks  PCP Please obtain BMP/CBC, 2 view CXR in 1week,  (see Discharge instructions)   PCP Please follow up on the following pending results:    Home Health: PT, RN if qualifies   Equipment/Devices: as below  Consultations: GI, Renal Discharge Condition: Stable    CODE STATUS: Full    Diet Recommendation: Renal-low carbohydrate, strict 1.5 L fluid restriction per day.  Check CBGs q. ACHS    Chief Complaint  Patient presents with   Abdominal Pain     Brief history of present illness from the day of admission and additional interim summary    63 y.o. 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  GI history significant for chronic constipation and recurrent nausea.  Presented to the ED with upper abdominal pain that started on Sunday.  Has had several episodes of emesis and 2 definitive episodes of bright red blood per rectum prior to arrival.  Since arrival to the ED patient reports every time she has dry heaves she passes bright red blood per rectum.  There is a smear of bright red blood noted on the bedsheet.  In the ED she was afebrile with initial blood pressure readings hypertensive at 171/110 but later decreased to 96 after receiving pain medication.  She was given a 500 cc lactated ringer  bolus.  Initial hemoglobin was 13.3.  Last dialysis Friday and current creatinine 7.38 with potassium of 5.3.  BC 15,000.  CT scan demonstrated ascending colitis.   Hospitalist service was consulted to evaluate the patient for admission.                                                                  Hospital Course   Acute lower GI bleeding with abdominal pain/CT consistent with descending colitis Gastroenterology has been consulted, with supportive care her symptoms are much better, area completely resolved still have some tenderness in the lower quadrants give her 5 more days of oral antibiotics with outpatient GI follow-up, overall significantly improved.   Anemia of chronic kidney disease Remains stable no transfusion PCP to monitor   CKD 5 on dialysis MWF Nephrology on board   Hypothyroidism Continue Synthroid    Hypertension Labile continue home regimen unchanged   HLD Continue home statin and Zetia  combination   Right parotid adenoma.  Requested to follow-up with ENT in a week postdischarge.  PCP to monitor.    Diabetes mellitus 2 Hemoglobin A1c 8.0 Resume home regimen  requested to check CBGs q. ACHS    Discharge diagnosis     Principal Problem:   Acute colitis Active Problems:   Rectal bleeding   Abnormal CT scan, gastrointestinal tract    Discharge instructions    Discharge Instructions     Discharge instructions   Complete by: As directed    Follow with Primary MD Nancy Barnie NOVAK, MD in 7 days   Get CBC, CMP, Magnesium   -  checked next visit with your primary MD   Activity: As tolerated with Full fall precautions use walker/cane & assistance as needed  Disposition Home    Diet: Renal-low carbohydrate diet, strict 1.5 L fluid restriction per day.  Check CBGs q. ACHS.  Special Instructions: If you have smoked or chewed Tobacco  in the last 2 yrs please stop smoking, stop any regular Alcohol  and or any Recreational drug use.  On your next visit with your primary care physician please Get Medicines reviewed and adjusted.  Please request your Prim.MD to go over all Hospital Tests and Procedure/Radiological  results at the follow up, please get all Hospital records sent to your Prim MD by signing hospital release before you go home.  If you experience worsening of your admission symptoms, develop shortness of breath, life threatening emergency, suicidal or homicidal thoughts you must seek medical attention immediately by calling 911 or calling your MD immediately  if symptoms less severe.  You Must read complete instructions/literature along with all the possible adverse reactions/side effects for all the Medicines you take and that have been prescribed to you. Take any new Medicines after you have completely understood and accpet all the possible adverse reactions/side effects.   Do not drive when taking Pain medications.  Do not take more than prescribed Pain, Sleep and Anxiety Medications  Wear Seat belts while driving.   Increase activity slowly   Complete by: As directed        Discharge Medications   Allergies as of 08/14/2024       Reactions   Hydralazine  Other (See Comments)   Hydralazine  Hcl Other (See Comments)   Hair loss   Hydrocodone Itching, Other (See Comments)   Upset stomach   Metformin  And Related Nausea And Vomiting, Other (See Comments)   Stomach pains, also   Other Nausea Only, Other (See Comments)   Lettuce- Does not digest this!!   Plaquenil [hydroxychloroquine Sulfate] Hives   Shellfish Allergy Nausea And Vomiting   Shellfish-derived Products Nausea Only, Other (See Comments)   Caused an upset stomach   Shrimp (diagnostic) Nausea Only, Other (See Comments)   Upset stomach    Sulfa Antibiotics Hives   Sulfur  Hives        Medication List     TAKE these medications    acetaminophen  500 MG tablet Commonly known as: TYLENOL  Take 1,000 mg by mouth every 8 (eight) hours as needed for mild pain (pain score 1-3), moderate pain (pain score 4-6) or headache.   albuterol  108 (90 Base) MCG/ACT inhaler Commonly known as: VENTOLIN  HFA Inhale 2 puffs into the  lungs every 6 (six) hours as needed.   ALPRAZolam  0.25 MG tablet Commonly known as: XANAX  Take 1 tablet (0.25 mg total) by mouth on Monday, Wednesday and Friday at hemodialysis.   amLODipine  10 MG tablet Commonly known as: NORVASC  Take 1 tablet (10 mg total) by mouth daily for hypertension   Aspirin  Low Dose 81 MG tablet Generic drug: aspirin  EC Take 1 tablet (81 mg total)  by mouth daily.   atorvastatin  40 MG tablet Commonly known as: LIPITOR Take 1 tablet (40 mg total) by mouth daily for cholesterol.   Biofreeze Cool The Pain 4 % Gel Generic drug: Menthol (Topical Analgesic) Apply 1 Application topically as needed (Shoulder and knee pain.).   cephALEXin  500 MG capsule Commonly known as: KEFLEX  Take 1 capsule (500 mg total) by mouth every 12 (twelve) hours.   ezetimibe  10 MG tablet Commonly known as: ZETIA  Take 1 tablet (10 mg total) by mouth daily for cholesterol.   isosorbide  mononitrate 30 MG 24 hr tablet Commonly known as: IMDUR  Take 2 tablets (60 mg total) by mouth daily for blood pressure.   labetalol  300 MG tablet Commonly known as: NORMODYNE  Take 1 tablet (300 mg total) by mouth 2 (two) times daily. What changed:  when to take this additional instructions   Lantus  SoloStar 100 UNIT/ML Solostar Pen Generic drug: insulin  glargine Inject 5 Units into the skin 2 (two) times daily. Increased by Provider on 6/24   levothyroxine  25 MCG tablet Commonly known as: SYNTHROID  Take 1 tablet (25 mcg total) by mouth daily for low thyroid  hormone.   metroNIDAZOLE  500 MG tablet Commonly known as: FLAGYL  Take 1 tablet (500 mg total) by mouth every 12 (twelve) hours.   omeprazole  20 MG capsule Commonly known as: PRILOSEC Take 2 capsules (40 mg total) by mouth daily for acid reflux.   ondansetron  4 MG tablet Commonly known as: ZOFRAN  Take 1 tablet (4 mg total) by mouth every 8 (eight) hours as needed for nausea.   polyethylene glycol powder 17 GM/SCOOP  powder Commonly known as: GLYCOLAX /MIRALAX  Mix 1 capful with 8 ounces of fluid and take by mouth daily as needed.   sevelamer  carbonate 800 MG tablet Commonly known as: RENVELA  Take 3 tablets (2,400 mg total) by mouth 3 (three) times daily with meals and 1 tablet with snacks.               Durable Medical Equipment  (From admission, onward)           Start     Ordered   08/12/24 1129  For home use only DME Other see comment  Once       Comments: Sliding Board  Question:  Length of Need  Answer:  Lifetime   08/12/24 1130             Follow-up Information     [facility], [facility] Follow up.   Specialty: Home Health Services Why: for home health services Contact information: [address] [place] [address] [phone]         Nancy Barnie NOVAK, MD. Schedule an appointment as soon as possible for a visit in 1 week(s).   Specialty: Internal Medicine Contact information: [address] [place] [address] [phone]         Leigh Elspeth SQUIBB, MD. Schedule an appointment as soon as possible for a visit in 1 week(s).   Specialty: Gastroenterology Contact information: [address] [place] [address] [phone]         Luciano Elspeth, MD. Schedule an appointment as soon as possible for a visit in 1 week(s).   Specialty: Otolaryngology Contact information: [address] [place] [address] [phone]                 Major procedures and Radiology Reports - PLEASE review detailed and final reports thoroughly  -  US  SOFT TISSUE HEAD & NECK (NON-THYROID ) Result Date: 08/12/2024 CLINICAL DATA:  Right parotid pleomorphic adenoma, surveillance imaging. This lesion was previously biopsied in 2024. EXAM: ULTRASOUND OF HEAD/NECK SOFT TISSUES TECHNIQUE: Ultrasound examination of the head and neck soft tissues was performed in the area of clinical concern. COMPARISON:   None Available. FINDINGS: Sonographic interrogation of the right thyroid  gland demonstrates a solid hypoechoic lobular mass measuring 2.9 x 1.9 x 2.9 cm. No significant internal vascularity. A small intraparotid lymph node is also visualized but appears normal in both size and morphology. IMPRESSION: Approximately 2.9 x 1.9 x 2.9 cm lobular hypoechoic soft tissue mass in the right parotid gland. Per report, this was previously biopsied and confirmed to represent a pleomorphic adenoma. Prior imaging is not available for review. Recommend correlation with outside imaging to assess for interval growth. Electronically Signed   By: Wilkie Lent M.D.   On: 08/12/2024 08:41   CT ABDOMEN PELVIS W CONTRAST Result Date: 08/10/2024 CLINICAL DATA:  upper abdominal pain that started today. Pt reports couple episodes of emesi EXAM: CT ABDOMEN AND PELVIS WITH CONTRAST TECHNIQUE: Multidetector CT imaging of the abdomen and pelvis was performed using the standard protocol following bolus administration of intravenous contrast. RADIATION DOSE REDUCTION: This exam was performed according to the departmental dose-optimization program which includes automated exposure control, adjustment of the mA and/or kV according to patient size and/or use of iterative reconstruction technique. CONTRAST:  75mL OMNIPAQUE  IOHEXOL  350 MG/ML SOLN COMPARISON:  CT abdomen pelvis 05/30/2022 FINDINGS: Lower chest: Partially visualized central venous catheter with tip terminating within the right atrium. Tiny hiatal hernia. Hepatobiliary: No focal liver abnormality. Status post cholecystectomy. No biliary dilatation. Pancreas: No focal lesion. Normal pancreatic contour. No surrounding inflammatory changes. No main pancreatic ductal dilatation. Spleen: Normal in size without focal abnormality. Adrenals/Urinary Tract: No adrenal nodule bilaterally. Bilateral kidneys enhance symmetrically. No hydronephrosis. No hydroureter. The urinary bladder is  unremarkable. Stomach/Bowel: Stomach is within normal limits. No evidence of small bowel wall thickening or dilatation. Few scattered small and large bowel diverticula. Descending colon bowel wall thickening and pericolonic fat stranding. Appendix appears normal. Vascular/Lymphatic: No abdominal aorta or iliac aneurysm. Mild atherosclerotic plaque of the aorta and its branches. No abdominal, pelvic, or inguinal lymphadenopathy. Reproductive: Status post hysterectomy. No adnexal masses. Other: No intraperitoneal free fluid. No intraperitoneal free gas. No organized fluid collection. Musculoskeletal: No abdominal wall hernia or abnormality. No suspicious lytic or blastic osseous lesions. No acute displaced fracture. IMPRESSION: 1. Descending colitis. 2. Few scattered small and large bowel diverticula. No acute diverticulitis. 3. Tiny hiatal hernia. Electronically Signed   By: Morgane  Naveau M.D.   On: 08/10/2024 01:16    Micro Results     No results found for this or any previous visit (from the past 240 hours).  Today   Subjective    Lucienne Schaffer today has no headache,no chest abdominal pain,no new weakness tingling or numbness, feels much better wants to go home today.     Objective   Blood pressure (!) 159/75, pulse 72, temperature (!) 97.4 F (36.3 C), resp. rate 13, height 5' 3 (1.6 m), weight 107.6 kg, SpO2 98%.   Intake/Output Summary (Last 24 hours) at 08/14/2024 0926 Last data filed at 08/14/2024 0400 Gross per 24 hour  Intake 480 ml  Output --  Net 480 ml    Exam  Awake Alert, No new F.N deficits,    [place]an.AT,PERRAL Supple Neck,   Symmetrical Chest wall movement, Good air movement  bilaterally, CTAB RRR,No Gallops,   +ve B.Sounds, Abd Soft, Non tender,  Bilat BKA   Data Review   Recent Labs  Lab 08/09/24 2250 08/10/24 1037 08/10/24 1427 08/11/24 0542 08/12/24 1059 08/13/24 0635 08/14/24 0552  WBC 14.0*  --  14.2* 13.3* 11.6* 11.9* 13.9*  HGB 13.3   < > 12.7  11.9* 11.9* 11.1* 11.5*  HCT 41.8   < > 40.2 37.8 37.9 35.2* 36.5  PLT 215  --  212 218 204 190 195  MCV 94.4  --  94.1 93.8 94.3 93.4 93.8  MCH 30.0  --  29.7 29.5 29.6 29.4 29.6  MCHC 31.8  --  31.6 31.5 31.4 31.5 31.5  RDW 14.0  --  14.4 14.5 14.5 14.4 14.1  LYMPHSABS 1.6  --   --  2.7 1.8 1.8 2.0  MONOABS 1.0  --   --  1.3* 1.1* 1.2* 1.1*  EOSABS 0.3  --   --  0.3 0.3 0.4 0.5  BASOSABS 0.1  --   --  0.1 0.1 0.1 0.1   < > = values in this interval not displayed.    Recent Labs  Lab 08/09/24 2250 08/10/24 0651 08/10/24 1037 08/11/24 0542  NA 133* 132*  --  132*  K 5.3* 5.5*  --  4.2  CL 97* 97*  --  93*  CO2 23 23  --  26  ANIONGAP 13 12  --  13  GLUCOSE 172* 180*  --  125*  BUN 39* 41*  --  33*  CREATININE 7.38* 7.70*  --  6.94*  AST 17  --   --  14*  ALT 18  --   --  16  ALKPHOS 93  --   --  84  BILITOT 0.7  --   --  0.7  ALBUMIN  3.6  --   --  3.4*  CRP  --   --   --  2.6*  PROCALCITON  --   --   --  0.39  HGBA1C  --  8.0*  --   --   MG  --   --   --  2.3  PHOS  --   --  6.2*  --   CALCIUM  9.2 9.0  --  8.9    Total Time in preparing paper work, data evaluation and todays exam - 35 minutes  Signature  -    Lavada Stank M.D on 08/14/2024 at 9:26 AM   -  To page go to [email or web address]

## 2024-08-14 NOTE — Progress Notes (Signed)
 Med rec only.  Pt being discharge from hospital today. Pt in hospital waiting to be discharged.      Mary Sharps, EMT-Paramedic [phone] 08/14/2024

## 2024-08-14 NOTE — Discharge Planning (Signed)
 [facility] Patient Discharge Orders- [facility] CLINIC: [facility]  Patient's name: Nancy [name] Admit/DC Dates: 08/09/2024 - 08/14/2024  Discharge Diagnoses: Lower GIB/Colitis. Completing course of PO antibiotics.    Outpatient Dialysis Orders:  -Heparin : No change -EDW No change   -Bath: No change   Anemia Aranesp : Given: --   Date of last dose/amount: --   PRBC's Given: -- Date/# of units: -- ESA dose for discharge: per protocol   Recent Labs  Lab 08/10/24 1037 08/10/24 1427 08/11/24 0542 08/12/24 1059 08/14/24 0552  HGB 12.9   < > 11.9*   < > 11.5*  K  --   --  4.2  --   --   CALCIUM   --   --  8.9  --   --   PHOS 6.2*  --   --   --   --   ALBUMIN   --   --  3.4*  --   --    < > = values in this interval not displayed.   Access intervention/Change: --   Medications: -IV Antibiotics:  None  -Anticoagulation: None    OTHER/APPTS/LABS    Completed by: Maisie Ronnald Acosta PA-C   D/C Meds to be reconciled by nurse after every discharge.    Reviewed by: MD:______ RN_______

## 2024-08-15 ENCOUNTER — Telehealth (HOSPITAL_COMMUNITY): Payer: Self-pay | Admitting: Nephrology

## 2024-08-15 NOTE — Telephone Encounter (Signed)
 Transition of Care - Initial Contact after Hospitalization  Date of discharge: 08/14/2024 Date of contact: 08/15/24  Method: Phone Spoke to: Patient  Patient contacted to discuss transition of care from recent inpatient hospitalization. Patient was admitted to [facility] from 08/09/24 to 08/14/2024 discharge diagnosis of LGIB/colitis.  The discharge medication list was reviewed. Patient understands the changes and has no concerns. She is feeling well today.  Patient will return to his/her outpatient HD unit on: Monday 08/17/24  No other concerns at this time.  Izetta Boehringer, PA-C [employer] Pager [phone]

## 2024-08-18 ENCOUNTER — Ambulatory Visit: Payer: Self-pay

## 2024-08-18 ENCOUNTER — Other Ambulatory Visit (HOSPITAL_COMMUNITY): Payer: Self-pay

## 2024-08-18 ENCOUNTER — Ambulatory Visit (INDEPENDENT_AMBULATORY_CARE_PROVIDER_SITE_OTHER): Admitting: Family Medicine

## 2024-08-18 ENCOUNTER — Other Ambulatory Visit: Payer: Self-pay | Admitting: Internal Medicine

## 2024-08-18 ENCOUNTER — Encounter: Payer: Self-pay | Admitting: Family Medicine

## 2024-08-18 VITALS — BP 145/82 | HR 74 | Ht 63.0 in

## 2024-08-18 DIAGNOSIS — F411 Generalized anxiety disorder: Secondary | ICD-10-CM

## 2024-08-18 DIAGNOSIS — R1012 Left upper quadrant pain: Secondary | ICD-10-CM

## 2024-08-18 DIAGNOSIS — Z09 Encounter for follow-up examination after completed treatment for conditions other than malignant neoplasm: Secondary | ICD-10-CM | POA: Diagnosis not present

## 2024-08-18 MED ORDER — OXYCODONE-ACETAMINOPHEN 5-325 MG PO TABS
1.0000 | ORAL_TABLET | ORAL | 0 refills | Status: DC | PRN
  Filled 2024-08-18: qty 6, 1d supply, fill #0

## 2024-08-18 NOTE — Telephone Encounter (Signed)
 Noted.  Patient has appointment with PCE.

## 2024-08-18 NOTE — Telephone Encounter (Signed)
 Patient recently discharged from ED for colitis, states is flaring up again, denies blood in stool.  FYI Only or Action Required?: Action required by provider: request for appointment and update on patient condition.  Patient was last seen in primary care on 06/04/2024 by [name], MD.  Called Nurse Triage reporting Abdominal Pain.  Symptoms began several weeks ago.  Interventions attempted: Other: see ED d/c documentation.  Symptoms are: unchanged.  Triage Disposition: See HCP Within 4 Hours (Or PCP Triage)  Patient/caregiver understands and will follow disposition?: Yes Reason for Disposition  [1] MILD-MODERATE pain AND [2] constant AND [3] age > 60 years  Answer Assessment - Initial Assessment Questions States no  more blood in her stool  1. LOCATION: Where does it hurt?      Right side  3. ONSET: When did the pain begin? (e.g., minutes, hours or days ago)      08/09/24  4. SUDDEN: Gradual or sudden onset?     Sudden  5. PATTERN Does the pain come and go, or is it constant?     Intermittent  6. SEVERITY: How bad is the pain?  (e.g., Scale 1-10; mild, moderate, or severe)     Mild, increases to severe  7. RECURRENT SYMPTOM: Have you ever had this type of stomach pain before? If Yes, ask: When was the last time? and What happened that time?      Yes, see ED note. Had to stop dialysis yesterday d/t pain  8. CAUSE: What do you think is causing the stomach pain? (e.g., gallstones, recent abdominal surgery)     Colitis  9. OTHER SYMPTOMS: Do you have any other symptoms? (e.g., back pain, diarrhea, fever, urination pain, vomiting)       Nausea  Protocols used: Abdominal Pain - [facility] Copied from CRM #[id number]. Topic: Clinical - Red Word Triage >> Aug 18, 2024 10:59 AM Tiffany S wrote: [facility] that prompted transfer to Nurse Triage: Abdomen pain

## 2024-08-19 ENCOUNTER — Other Ambulatory Visit: Payer: Self-pay

## 2024-08-19 ENCOUNTER — Other Ambulatory Visit (HOSPITAL_COMMUNITY): Payer: Self-pay

## 2024-08-19 NOTE — Progress Notes (Signed)
 Established Patient Office Visit  Subjective    Patient ID: Nancy [name], female    DOB: 1961-02-24  Age: 63 y.o. MRN: [id number]  CC:  Chief Complaint  Patient presents with   Hospitalization Follow-up    Pt has been having pain from her colitis. Pt reports she had to stop dialysis yesterday because the pain was too severe. Pt reports she has been vomiting this morning and last night.     HPI Nancy [name] presents with complaint of abdominal pain. She was recently in hospital 2/2 symptoms. Since discharge her symptoms have recurred/increased.   Outpatient Encounter Medications as of 08/18/2024  Medication Sig   acetaminophen  (TYLENOL ) 500 MG tablet Take 1,000 mg by mouth every 8 (eight) hours as needed for mild pain (pain score 1-3), moderate pain (pain score 4-6) or headache.   albuterol  (VENTOLIN  HFA) 108 (90 Base) MCG/ACT inhaler Inhale 2 puffs into the lungs every 6 (six) hours as needed.   ALPRAZolam  (XANAX ) 0.25 MG tablet Take 1 tablet (0.25 mg total) by mouth on Monday, Wednesday and Friday at hemodialysis.   amLODipine  (NORVASC ) 10 MG tablet Take 1 tablet (10 mg total) by mouth daily for hypertension   aspirin  81 MG EC tablet Take 1 tablet (81 mg total) by mouth daily.   atorvastatin  (LIPITOR) 40 MG tablet Take 1 tablet (40 mg total) by mouth daily for cholesterol.   cephALEXin  (KEFLEX ) 500 MG capsule Take 1 capsule (500 mg total) by mouth every 12 (twelve) hours for 5 days.   ezetimibe  (ZETIA ) 10 MG tablet Take 1 tablet (10 mg total) by mouth daily for cholesterol.   insulin  glargine (LANTUS  SOLOSTAR) 100 UNIT/ML Solostar Pen Inject 5 Units into the skin 2 (two) times daily. Increased by Provider on 6/24   isosorbide  mononitrate (IMDUR ) 30 MG 24 hr tablet Take 2 tablets (60 mg total) by mouth daily for blood pressure.   labetalol  (NORMODYNE ) 300 MG tablet Take 1 tablet (300 mg total) by mouth 2 (two) times daily. (Patient taking differently: Take 300 mg by mouth  daily. Take one tablet by mouth on Tues, Thurs, Saturday, and Sunday in the morning (non-dialysis days). Take one tablet by mouth on Mon, Wed,and Fri in the evening after dialysis.)   levothyroxine  (SYNTHROID ) 25 MCG tablet Take 1 tablet (25 mcg total) by mouth daily for low thyroid  hormone.   Menthol, Topical Analgesic, (BIOFREEZE COOL THE PAIN) 4 % GEL Apply 1 Application topically as needed (Shoulder and knee pain.).   metroNIDAZOLE  (FLAGYL ) 500 MG tablet Take 1 tablet (500 mg total) by mouth every 12 (twelve) hours for 5 days.   omeprazole  (PRILOSEC) 20 MG capsule Take 2 capsules (40 mg total) by mouth daily for acid reflux.   ondansetron  (ZOFRAN ) 4 MG tablet Take 1 tablet (4 mg total) by mouth every 8 (eight) hours as needed for nausea.   oxyCODONE -acetaminophen  (PERCOCET) 5-325 MG tablet Take 1 tablet by mouth every 4 (four) hours as needed for severe pain (pain score 7-10).   polyethylene glycol powder (GLYCOLAX /MIRALAX ) 17 GM/SCOOP powder Mix 1 capful with 8 ounces of fluid and take by mouth daily as needed.   sevelamer  carbonate (RENVELA ) 800 MG tablet Take 3 tablets (2,400 mg total) by mouth 3 (three) times daily with meals and 1 tablet with snacks.   [DISCONTINUED] Insulin  NPH Isophane & Regular (RELION 70/30 [place]) Inject 35 Units into the skin 2 (two) times daily.   No facility-administered encounter medications on file as of 08/18/2024.  Past Medical History:  Diagnosis Date   Anemia 2006   Anginal pain (HCC)    Carotid artery disease (HCC)    right ICA occlusion, 40-59% LICA 02/2021 US    CHF (congestive heart failure) (HCC)    Coronary artery disease    Depression 2014   previously on amitryptiline    Diabetes mellitus with neurological manifestation (HCC) 2006   Diabetic peripheral neuropathy (HCC) 04/26/2020   Dysphagia    Dyspnea    ESRD on hemodialysis (HCC)    Fracture of left ankle 1997   Gastroparesis 07/2016   Heart murmur    HOH (hard of hearing) 2004    Hyperlipidemia 2006   Hypertension 2006   IBS (irritable bowel syndrome) 2002   Leukopenia 2015   Macular degeneration 11/2019   Peripheral vascular disease (HCC)    Shingles 2009   Stroke [facility]) 2020   Thyroid  nodule 2004    Past Surgical History:  Procedure Laterality Date   A/V FISTULAGRAM Right 09/25/2022   Procedure: A/V Fistulagram;  Surgeon: Serene Gaile ORN, MD;  Location: [facility] INVASIVE CV LAB;  Service: Cardiovascular;  Laterality: Right;   ABDOMINAL HYSTERECTOMY  2005   AMPUTATION Bilateral 04/04/2022   Procedure: BILATERAL BELOW KNEE AMPUTATION;  Surgeon: Harden Jerona GAILS, MD;  Location: [facility] OR;  Service: Orthopedics;  Laterality: Bilateral;   AV FISTULA PLACEMENT Right 12/22/2021   Procedure: RIGHT ARM ARTERIOVENOUS (AV) BRACIOCPHELAIC FISTULA CREATION;  Surgeon: Sheree Penne Bruckner, MD;  Location: [facility] OR;  Service: Vascular;  Laterality: Right;   AV FISTULA PLACEMENT Right 02/19/2022   Procedure: RIGHT ARTERIOVENOUS FISTULA;  Surgeon: Eliza Bruckner RAMAN, MD;  Location: [facility] OR;  Service: Vascular;  Laterality: Right;   AV FISTULA PLACEMENT Left 12/24/2023   Procedure: LEFT BRACHIOCEPHALIC ARTERIOVENOUS (AV) FISTULA CREATION;  Surgeon: Lanis Fonda BRAVO, MD;  Location: [facility] OR;  Service: Vascular;  Laterality: Left;   BASCILIC VEIN TRANSPOSITION Right 08/07/2022   Procedure: SECOND STAGE RIGHT BASILIC VEIN TRANSPOSITION;  Surgeon: Eliza Bruckner RAMAN, MD;  Location: [facility] OR;  Service: Vascular;  Laterality: Right;   CATARACT EXTRACTION Left 11/2019   CESAREAN SECTION  1983   CHOLECYSTECTOMY N/A 03/05/2020   Procedure: LAPAROSCOPIC CHOLECYSTECTOMY WITH INTRAOPERATIVE CHOLANGIOGRAM;  Surgeon: Belinda Cough, MD;  Location: [facility] ORS;  Service: General;  Laterality: N/A;   COLONOSCOPY     [facility] said no polyps were found   ESOPHAGOGASTRODUODENOSCOPY (EGD) WITH PROPOFOL  Left 08/26/2014   Procedure: ESOPHAGOGASTRODUODENOSCOPY (EGD) WITH PROPOFOL ;  Surgeon: Elsie Cree, MD;   Location: [facility];  Service: Endoscopy;  Laterality: Left;   ESOPHAGOGASTRODUODENOSCOPY (EGD) WITH PROPOFOL  N/A 03/03/2020   Procedure: ESOPHAGOGASTRODUODENOSCOPY (EGD) WITH PROPOFOL ;  Surgeon: Albertus Gordy HERO, MD;  Location: [facility];  Service: Gastroenterology;  Laterality: N/A;   IR FLUORO GUIDE CV LINE RIGHT  02/15/2022   IR US  GUIDE VASC ACCESS RIGHT  02/15/2022   PERIPHERAL VASCULAR BALLOON ANGIOPLASTY Right 09/25/2022   Procedure: PERIPHERAL VASCULAR BALLOON ANGIOPLASTY;  Surgeon: Serene Gaile ORN, MD;  Location: [facility];  Service: Cardiovascular;  Laterality: Right;  AVF   RIGHT HEART CATH N/A 07/14/2021   Procedure: RIGHT HEART CATH;  Surgeon: Rolan Ezra RAMAN, MD;  [facility] INVASIVE CV LAB;  Service: Cardiovascular;  Laterality: N/A;   RIGHT/LEFT HEART CATH AND CORONARY ANGIOGRAPHY N/A 04/27/2021   Procedure: RIGHT/LEFT HEART CATH AND CORONARY ANGIOGRAPHY;  Surgeon: Court Dorn PARAS, MD;  Location: [facility]VAS[facility] CV LAB;  Service: Cardiovascular;  Laterality: N/A;    Family History  Problem Relation Age of Onset   Hypertension Mother    Heart disease Mother    Diabetes Mother    Thyroid  disease Mother    Congestive Heart Failure Mother    Breast cancer Maternal Grandmother    Colon cancer Maternal Grandfather    Heart attack Sister    Heart disease Brother    Hyperlipidemia Brother    Hypertension Brother    Diabetes Father    Breast cancer Maternal Aunt     Social History   Socioeconomic History   Marital status: Legally Separated    Spouse name: Lamar   Number of children: 1   Years of education: some colle   Highest education level: Not on file  Occupational History   Occupation: Employed FT as Warden/ranger    Comment: [employer] , NA  Tobacco Use   Smoking status: Former    Current packs/day: 0.50    Average packs/day: 0.5 packs/day for 44.2 years (22.0 ttl pk-yrs)    Types: Cigarettes    Start date: 12/16/1980    Quit date: 12/16/1974     Passive exposure: Never   Smokeless tobacco: Never   Tobacco comments:    Former smoker 11/03/21  Vaping Use   Vaping status: Never Used  Substance and Sexual Activity   Alcohol use: No   Drug use: No   Sexual activity: Not Currently    Birth control/protection: None  Other Topics Concern   Not on file  Social History Narrative   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    Social Drivers of Health   Financial Resource Strain: Low Risk  (02/04/2024)   Overall Financial Resource Strain (CARDIA)    Difficulty of Paying Living Expenses: Not hard at all  Food Insecurity: No Food Insecurity (08/11/2024)   Hunger Vital Sign    Worried About Running Out of Food in the Last Year: Never true    Ran Out of Food in the Last Year: Never true  Transportation Needs: No Transportation Needs (08/11/2024)   PRAPARE - Administrator, Civil Service (Medical): No    Lack of Transportation (Non-Medical): No  Physical Activity: Inactive (02/04/2024)   Exercise Vital Sign    Days of Exercise per Week: 0 days    Minutes of Exercise per Session: 0 min  Stress: No Stress Concern Present (02/04/2024)   [employer] of Occupational Health - Occupational Stress Questionnaire    Feeling of Stress : Not at all  Social Connections: Moderately Integrated (02/04/2024)   Social Connection and Isolation Panel    Frequency of Communication with Friends and Family: Twice a week    Frequency of Social Gatherings with Friends and Family: Once a week    Attends Religious Services: Never    Database administrator or Organizations: Yes    Attends Banker Meetings: Never    Marital Status: Married  Catering manager Violence: Not At Risk (08/11/2024)   Humiliation, Afraid, Rape, and Kick questionnaire    Fear of Current or Ex-Partner: No    Emotionally Abused: No    Physically Abused: No    Sexually Abused: No    Review of Systems  Constitutional:   Negative for chills and fever.  Gastrointestinal:  Positive for abdominal pain. Negative for nausea and vomiting.  All other systems reviewed and are negative.       Objective    BP (!) 145/82   Pulse 74  Ht 5' 3 (1.6 m)   SpO2 98%   BMI 41.12 kg/m   Physical Exam Vitals and nursing note reviewed.  Constitutional:      General: She is in acute distress.     Appearance: She is obese.  Cardiovascular:     Rate and Rhythm: Normal rate and regular rhythm.  Pulmonary:     Effort: Pulmonary effort is normal.     Breath sounds: Normal breath sounds.  Abdominal:     General: There is no distension.     Palpations: Abdomen is soft.     Tenderness: There is abdominal tenderness in the right upper quadrant.  Musculoskeletal:     Comments: Utilizing wheelchair  Neurological:     General: No focal deficit present.     Mental Status: She is alert and oriented to person, place, and time.         Assessment & Plan:   Left upper quadrant abdominal pain  Hospital discharge follow-up  Other orders -     oxyCODONE -Acetaminophen ; Take 1 tablet by mouth every 4 (four) hours as needed for severe pain (pain score 7-10).  Dispense: 6 tablet; Refill: 0   Patient referred to ED for further eval/management but seems some reluctant. Patient had not as of yet made follow up GI appt as recommended at discharge and was encouraged to do so asap. If sx persist or worsen patient again encouraged to return to ED. Patient v.u.  No follow-ups on file.   Tanda Raguel SQUIBB, MD

## 2024-08-20 ENCOUNTER — Other Ambulatory Visit (HOSPITAL_COMMUNITY): Payer: Self-pay

## 2024-08-21 ENCOUNTER — Other Ambulatory Visit (HOSPITAL_COMMUNITY): Payer: Self-pay | Admitting: Emergency Medicine

## 2024-08-21 ENCOUNTER — Other Ambulatory Visit (HOSPITAL_COMMUNITY): Payer: Self-pay

## 2024-08-21 NOTE — Progress Notes (Signed)
 Med rec only.  Filled box  2 weeks.  Reviewed pending appointments w/ Mr. Woodle.   Called pharmacy to f/u to see if her Xanax  has been refilled to discovered that it was not approved.  Message sent to Dr. Vicci regarding additional refills.  Will wait to hear back fro Dr. Vicci.    Mary Sharps, EMT-Paramedic [phone] 08/21/2024

## 2024-08-22 ENCOUNTER — Emergency Department (HOSPITAL_BASED_OUTPATIENT_CLINIC_OR_DEPARTMENT_OTHER)
Admission: EM | Admit: 2024-08-22 | Discharge: 2024-08-23 | Disposition: A | Discharge status: Home / Self Care | Attending: Emergency Medicine | Admitting: Emergency Medicine

## 2024-08-22 ENCOUNTER — Other Ambulatory Visit: Payer: Self-pay

## 2024-08-22 DIAGNOSIS — Z7982 Long term (current) use of aspirin: Secondary | ICD-10-CM | POA: Insufficient documentation

## 2024-08-22 DIAGNOSIS — Z79899 Other long term (current) drug therapy: Secondary | ICD-10-CM | POA: Insufficient documentation

## 2024-08-22 DIAGNOSIS — I2510 Atherosclerotic heart disease of native coronary artery without angina pectoris: Secondary | ICD-10-CM | POA: Insufficient documentation

## 2024-08-22 DIAGNOSIS — Z992 Dependence on renal dialysis: Secondary | ICD-10-CM | POA: Insufficient documentation

## 2024-08-22 DIAGNOSIS — N186 End stage renal disease: Secondary | ICD-10-CM | POA: Diagnosis not present

## 2024-08-22 DIAGNOSIS — I509 Heart failure, unspecified: Secondary | ICD-10-CM | POA: Insufficient documentation

## 2024-08-22 DIAGNOSIS — I132 Hypertensive heart and chronic kidney disease with heart failure and with stage 5 chronic kidney disease, or end stage renal disease: Secondary | ICD-10-CM | POA: Insufficient documentation

## 2024-08-22 DIAGNOSIS — E1122 Type 2 diabetes mellitus with diabetic chronic kidney disease: Secondary | ICD-10-CM | POA: Diagnosis not present

## 2024-08-22 DIAGNOSIS — D696 Thrombocytopenia, unspecified: Secondary | ICD-10-CM | POA: Insufficient documentation

## 2024-08-22 DIAGNOSIS — R112 Nausea with vomiting, unspecified: Secondary | ICD-10-CM | POA: Diagnosis not present

## 2024-08-22 DIAGNOSIS — Z794 Long term (current) use of insulin: Secondary | ICD-10-CM | POA: Insufficient documentation

## 2024-08-22 DIAGNOSIS — R109 Unspecified abdominal pain: Secondary | ICD-10-CM

## 2024-08-22 DIAGNOSIS — R1031 Right lower quadrant pain: Secondary | ICD-10-CM | POA: Diagnosis not present

## 2024-08-22 LAB — URINALYSIS, ROUTINE W REFLEX MICROSCOPIC
Bilirubin Urine: NEGATIVE
Glucose, UA: 100 mg/dL — AB
Ketones, ur: NEGATIVE mg/dL
Leukocytes,Ua: NEGATIVE
Nitrite: NEGATIVE
Protein, ur: 100 mg/dL — AB
Specific Gravity, Urine: 1.009 (ref 1.005–1.030)
pH: 8 (ref 5.0–8.0)

## 2024-08-22 LAB — CBC
HCT: 39.2 % (ref 36.0–46.0)
Hemoglobin: 12.5 g/dL (ref 12.0–15.0)
MCH: 29.6 pg (ref 26.0–34.0)
MCHC: 31.9 g/dL (ref 30.0–36.0)
MCV: 92.9 fL (ref 80.0–100.0)
Platelets: 116 K/uL — ABNORMAL LOW (ref 150–400)
RBC: 4.22 MIL/uL (ref 3.87–5.11)
RDW: 14.6 % (ref 11.5–15.5)
WBC: 8.9 K/uL (ref 4.0–10.5)
nRBC: 0 % (ref 0.0–0.2)

## 2024-08-22 NOTE — ED Triage Notes (Signed)
 Pt POV reporting n/v due to colitis x3 days, recently discharge from hospital r/t same 8/29, concerned for dehydration.

## 2024-08-23 ENCOUNTER — Other Ambulatory Visit (HOSPITAL_COMMUNITY): Payer: Self-pay

## 2024-08-23 ENCOUNTER — Other Ambulatory Visit: Payer: Self-pay | Admitting: Internal Medicine

## 2024-08-23 ENCOUNTER — Emergency Department (HOSPITAL_BASED_OUTPATIENT_CLINIC_OR_DEPARTMENT_OTHER)

## 2024-08-23 DIAGNOSIS — F411 Generalized anxiety disorder: Secondary | ICD-10-CM

## 2024-08-23 LAB — COMPREHENSIVE METABOLIC PANEL WITH GFR
ALT: 19 U/L (ref 0–44)
AST: 31 U/L (ref 15–41)
Albumin: 4.1 g/dL (ref 3.5–5.0)
Alkaline Phosphatase: 92 U/L (ref 38–126)
Anion gap: 18 — ABNORMAL HIGH (ref 5–15)
BUN: 13 mg/dL (ref 8–23)
CO2: 25 mmol/L (ref 22–32)
Calcium: 9 mg/dL (ref 8.9–10.3)
Chloride: 97 mmol/L — ABNORMAL LOW (ref 98–111)
Creatinine, Ser: 5.26 mg/dL — ABNORMAL HIGH (ref 0.44–1.00)
GFR, Estimated: 9 mL/min — ABNORMAL LOW (ref >60)
Glucose, Bld: 118 mg/dL — ABNORMAL HIGH (ref 70–99)
Potassium: 4 mmol/L (ref 3.5–5.1)
Sodium: 139 mmol/L (ref 135–145)
Total Bilirubin: 0.4 mg/dL (ref 0.0–1.2)
Total Protein: 7.2 g/dL (ref 6.5–8.1)

## 2024-08-23 LAB — LIPASE, BLOOD: Lipase: 19 U/L (ref 11–51)

## 2024-08-23 MED ORDER — PROCHLORPERAZINE MALEATE 10 MG PO TABS
10.0000 mg | ORAL_TABLET | Freq: Four times a day (QID) | ORAL | 0 refills | Status: DC | PRN
  Filled 2024-08-23 – 2024-08-24 (×2): qty 20, 5d supply, fill #0

## 2024-08-23 MED ORDER — PROCHLORPERAZINE EDISYLATE 10 MG/2ML IJ SOLN
10.0000 mg | INTRAMUSCULAR | Status: AC
  Administered 2024-08-23: 10 mg via INTRAVENOUS
  Filled 2024-08-23: qty 2

## 2024-08-23 MED ORDER — ONDANSETRON HCL 4 MG/2ML IJ SOLN
4.0000 mg | Freq: Once | INTRAMUSCULAR | Status: AC
  Administered 2024-08-23: 4 mg via INTRAVENOUS
  Filled 2024-08-23: qty 2

## 2024-08-23 MED ORDER — SODIUM CHLORIDE 0.9 % IV BOLUS
1000.0000 mL | Freq: Once | INTRAVENOUS | Status: AC
  Administered 2024-08-23: 1000 mL via INTRAVENOUS

## 2024-08-23 MED ORDER — OXYCODONE-ACETAMINOPHEN 5-325 MG PO TABS
1.0000 | ORAL_TABLET | ORAL | 0 refills | Status: DC | PRN
  Filled 2024-08-23: qty 10, 2d supply, fill #0

## 2024-08-23 MED ORDER — MORPHINE SULFATE (PF) 4 MG/ML IV SOLN
4.0000 mg | Freq: Once | INTRAVENOUS | Status: AC
  Administered 2024-08-23: 4 mg via INTRAVENOUS
  Filled 2024-08-23: qty 1

## 2024-08-23 NOTE — ED Provider Notes (Signed)
 [place] EMERGENCY DEPARTMENT AT [facility] Provider Note   CSN: [id number] Arrival date & time: 08/22/24  2223     Patient presents with: Emesis and Abdominal Pain   Nancy [name] is a 63 y.o. female.   The history is provided by the patient.  Emesis Associated symptoms: abdominal pain   Abdominal Pain Associated symptoms: vomiting    She has history of hypertension, diabetes, hyperlipidemia, heart failure, coronary disease, end-stage renal disease on hemodialysis and was recently hospitalized for colitis and comes in with recurrent nausea and vomiting and right-sided abdominal pain.  She was discharged from the hospital about 10 days ago and started having recurrence of systems 1-2 days later.  She gets dialysis Monday-Wednesday-Friday and states that dialysis 5 days ago had to be cut short because of abdominal pain, but she did complete 2 dialysis sessions following that.  She is complaining of pain in the right side of her abdomen with some radiation to the right flank.  She denies fever but has had some chills and sweats.  She is concerned that she might be getting dehydrated.  She did have diarrhea 2 days ago, but has not had a bowel movement since then.    Prior to Admission medications   Medication Sig Start Date End Date Taking? Authorizing Provider  acetaminophen  (TYLENOL ) 500 MG tablet Take 1,000 mg by mouth every 8 (eight) hours as needed for mild pain (pain score 1-3), moderate pain (pain score 4-6) or headache.    [provider]  albuterol  (VENTOLIN  HFA) 108 (90 Base) MCG/ACT inhaler Inhale 2 puffs into the lungs every 6 (six) hours as needed. 08/21/23   [name], MD  ALPRAZolam  (XANAX ) 0.25 MG tablet Take 1 tablet (0.25 mg total) by mouth on Monday, Wednesday and Friday at hemodialysis. 06/10/24   [name], MD  amLODipine  (NORVASC ) 10 MG tablet Take 1 tablet (10 mg total) by mouth daily for hypertension 06/04/24   [name], MD  aspirin  81 MG EC tablet Take 1 tablet (81 mg total) by mouth daily. 09/26/21   [place], Harlene HERO, FNP  atorvastatin  (LIPITOR) 40 MG tablet Take 1 tablet (40 mg total) by mouth daily for cholesterol. 06/04/24   [name], MD  cephALEXin  (KEFLEX ) 500 MG capsule Take 1 capsule (500 mg total) by mouth every 12 (twelve) hours for 5 days. Patient not taking: Reported on 08/21/2024 08/14/24   Singh, Prashant K, MD  ezetimibe  (ZETIA ) 10 MG tablet Take 1 tablet (10 mg total) by mouth daily for cholesterol. 06/04/24   [name], MD  insulin  glargine (LANTUS  SOLOSTAR) 100 UNIT/ML Solostar Pen Inject 5 Units into the skin 2 (two) times daily. Increased by Provider on 6/24 06/16/24   [name], MD  isosorbide  mononitrate (IMDUR ) 30 MG 24 hr tablet Take 2 tablets (60 mg total) by mouth daily for blood pressure. 06/04/24   [name], MD  labetalol  (NORMODYNE ) 300 MG tablet Take 1 tablet (300 mg total) by mouth 2 (two) times daily. Patient taking differently: Take 300 mg by mouth daily. Take one tablet by mouth on Tues, Thurs, Saturday, and Sunday in the morning (non-dialysis days). Take one tablet by mouth on Mon, Wed,and Fri in the evening after dialysis. 06/04/24   [name], MD  levothyroxine  (SYNTHROID ) 25 MCG tablet Take 1 tablet (25 mcg total) by mouth daily for low thyroid  hormone. 06/04/24   [name], MD  Menthol, Topical Analgesic, (  BIOFREEZE COOL THE PAIN) 4 % GEL Apply 1 Application topically as needed (Shoulder and knee pain.).    [provider]  metroNIDAZOLE  (FLAGYL ) 500 MG tablet Take 1 tablet (500 mg total) by mouth every 12 (twelve) hours for 5 days. Patient not taking: Reported on 08/21/2024 08/14/24   Dennise Lavada POUR, MD  omeprazole  (PRILOSEC) 20 MG capsule Take 2 capsules (40 mg total) by mouth daily for acid reflux. 07/13/24   [name], MD  ondansetron  (ZOFRAN ) 4 MG tablet Take 1 tablet (4 mg total) by mouth every 8 (eight)  hours as needed for nausea. Patient not taking: Reported on 08/21/2024 04/29/24     oxyCODONE -acetaminophen  (PERCOCET) 5-325 MG tablet Take 1 tablet by mouth every 4 (four) hours as needed for severe pain (pain score 7-10). 08/18/24   Tanda Bleacher, MD  polyethylene glycol powder (GLYCOLAX /MIRALAX ) 17 GM/SCOOP powder Mix 1 capful with 8 ounces of fluid and take by mouth daily as needed. 03/25/24   [name], MD  sevelamer  carbonate (RENVELA ) 800 MG tablet Take 3 tablets (2,400 mg total) by mouth 3 (three) times daily with meals and 1 tablet with snacks. 09/04/23     Insulin  NPH Isophane & Regular (RELION 70/30 [place]) Inject 35 Units into the skin 2 (two) times daily.  08/17/14  [provider]    Allergies: Hydralazine , Hydralazine  hcl, Hydrocodone, Metformin  and related, Other, Plaquenil [hydroxychloroquine sulfate], Shellfish allergy, Shellfish-derived products, Shrimp (diagnostic), Sulfa antibiotics, and Sulfur     Review of Systems  Gastrointestinal:  Positive for abdominal pain and vomiting.  All other systems reviewed and are negative.   Updated Vital Signs BP (!) 206/98 (BP Location: Right Arm)   Pulse 82   Temp 97.7 F (36.5 C)   Resp 16   SpO2 98%   Physical Exam Vitals and nursing note reviewed.   63 year old female, resting comfortably and in no acute distress. Vital signs are significant for elevated blood pressure. Oxygen saturation is 98%, which is normal. Head is normocephalic and atraumatic. PERRLA, EOMI.  Back is nontender and there is no CVA tenderness. Lungs are clear without rales, wheezes, or rhonchi. Chest is nontender. Heart has regular rate and rhythm without murmur. Abdomen is soft, flat, with mild to moderate tenderness in the right mid and lower abdomen.  There is no rebound or guarding.  Peristalsis is decreased. Extremities: Bilateral below the knee amputations. Skin is warm and dry without rash. Neurologic: Awake and alert, moves all extremities  equally.  (all labs ordered are listed, but only abnormal results are displayed) Labs Reviewed  CBC - Abnormal; Notable for the following components:      Result Value   Platelets 116 (*)    All other components within normal limits  URINALYSIS, ROUTINE W REFLEX MICROSCOPIC - Abnormal; Notable for the following components:   Color, Urine COLORLESS (*)    Glucose, UA 100 (*)    Hgb urine dipstick SMALL (*)    Protein, ur 100 (*)    Bacteria, UA RARE (*)    All other components within normal limits  COMPREHENSIVE METABOLIC PANEL WITH GFR  LIPASE, BLOOD    EKG: EKG Interpretation Date/Time:  Saturday August 22 2024 22:36:56 EDT Ventricular Rate:  82 PR Interval:  172 QRS Duration:  82 QT Interval:  380 QTC Calculation: 443 R Axis:   -14  Text Interpretation: Normal sinus rhythm Anterior infarct , age undetermined Abnormal ECG When compared with ECG of 09-Aug-2024 22:46, No significant  change was found Confirmed by Raford Lenis ([id number]) on 08/23/2024 12:06:26 AM  Radiology: No results found.   Procedures   Medications Ordered in the ED - No data to display                                  Medical Decision Making Amount and/or Complexity of Data Reviewed Labs: ordered. Radiology: ordered.  Risk Prescription drug management.   Abdominal pain with vomiting in patient with end-stage renal disease and recent hospitalization for colitis.  This is a presentation with a wide range of treatment options and carries with it a high risk for morbidity and complications.  Differential diagnosis includes, but is not limited to, diverticulitis, bowel obstruction, appendicitis, cholecystitis, pancreatitis, recurrent colitis.  I have reviewed her initial laboratory results and my interpretation is normal WBC, normal hemoglobin, mild thrombocytopenia which is new compared with 08/14/2024.  Comprehensive metabolic panel is pending.  Urinalysis was significant for glucosuria and proteinuria.   I have ordered IV fluids, morphine  for pain, ondansetron  for nausea and I have ordered CT of abdomen and pelvis.  I have reviewed her past records and note hospitalization 8/24-8/29/2025 for colitis, CT scan of abdomen and pelvis on 8/25 showing colitis of the descending colon.  She continued to have dry heaves, I ordered a dose of prochlorperazine .  I have reviewed her metabolic panel and my interpretation is elevated creatinine consistent with known history of end-stage renal disease, normal lipase.  Minimal elevation of anion gap secondary to renal failure.  CT of abdomen and pelvis shows no acute findings.  I have independently viewed the images, and agree with the radiologist's interpretation.  Patient is feeling much better following above-noted treatment.  I am discharging her with prescriptions for prochlorperazine  and a small number of oxycodone -acetaminophen  tablets.  I am recommending follow-up with PCP in 3 days, return to the emergency department if symptoms are worsening.     Final diagnoses:  Abdominal pain, unspecified abdominal location  Nausea and vomiting, unspecified vomiting type  End-stage renal disease on hemodialysis (HCC)  Thrombocytopenia [facility])    ED Discharge Orders          Ordered    oxyCODONE -acetaminophen  (PERCOCET) 5-325 MG tablet  Every 4 hours PRN        08/23/24 0132    prochlorperazine  (COMPAZINE ) 10 MG tablet  Every 6 hours PRN        08/23/24 0132               Raford Lenis, MD 08/23/24 [phone]

## 2024-08-23 NOTE — ED Notes (Signed)
 Pt taken to CT.

## 2024-08-23 NOTE — Discharge Instructions (Addendum)
 Your CT scan did not show any serious cause for your abdominal pain.  I am giving you prescriptions for a small number of oxycodone -acetaminophen  tablets to use for pain, and prochlorperazine  to use for nausea.  At any point, if your symptoms are getting worse, please return to the emergency department for further evaluation.

## 2024-08-23 NOTE — ED Notes (Signed)
 Pt is dry heaving. Provider aware and orders placed. Pt is also sweating and complains of being hot. Fan provided for comfort. Husband at bedside

## 2024-08-24 ENCOUNTER — Encounter (HOSPITAL_COMMUNITY): Payer: Self-pay

## 2024-08-24 ENCOUNTER — Other Ambulatory Visit: Payer: Self-pay

## 2024-08-24 ENCOUNTER — Other Ambulatory Visit (HOSPITAL_COMMUNITY): Payer: Self-pay

## 2024-08-25 ENCOUNTER — Other Ambulatory Visit (HOSPITAL_COMMUNITY): Payer: Self-pay

## 2024-08-25 ENCOUNTER — Emergency Department (HOSPITAL_BASED_OUTPATIENT_CLINIC_OR_DEPARTMENT_OTHER)
Admission: EM | Admit: 2024-08-25 | Discharge: 2024-08-26 | Disposition: A | Discharge status: Home / Self Care | Attending: Emergency Medicine | Admitting: Emergency Medicine

## 2024-08-25 ENCOUNTER — Encounter (HOSPITAL_COMMUNITY): Payer: Self-pay

## 2024-08-25 ENCOUNTER — Emergency Department (HOSPITAL_BASED_OUTPATIENT_CLINIC_OR_DEPARTMENT_OTHER)

## 2024-08-25 ENCOUNTER — Other Ambulatory Visit: Payer: Self-pay | Admitting: Family Medicine

## 2024-08-25 ENCOUNTER — Other Ambulatory Visit: Payer: Self-pay

## 2024-08-25 ENCOUNTER — Encounter (HOSPITAL_BASED_OUTPATIENT_CLINIC_OR_DEPARTMENT_OTHER): Payer: Self-pay

## 2024-08-25 DIAGNOSIS — Z7982 Long term (current) use of aspirin: Secondary | ICD-10-CM | POA: Diagnosis not present

## 2024-08-25 DIAGNOSIS — Z794 Long term (current) use of insulin: Secondary | ICD-10-CM | POA: Diagnosis not present

## 2024-08-25 DIAGNOSIS — E1122 Type 2 diabetes mellitus with diabetic chronic kidney disease: Secondary | ICD-10-CM | POA: Diagnosis not present

## 2024-08-25 DIAGNOSIS — N186 End stage renal disease: Secondary | ICD-10-CM | POA: Insufficient documentation

## 2024-08-25 DIAGNOSIS — I509 Heart failure, unspecified: Secondary | ICD-10-CM | POA: Insufficient documentation

## 2024-08-25 DIAGNOSIS — D72829 Elevated white blood cell count, unspecified: Secondary | ICD-10-CM | POA: Insufficient documentation

## 2024-08-25 DIAGNOSIS — R1013 Epigastric pain: Secondary | ICD-10-CM | POA: Diagnosis present

## 2024-08-25 LAB — RESP PANEL BY RT-PCR (RSV, FLU A&B, COVID)  RVPGX2
Influenza A by PCR: NEGATIVE
Influenza B by PCR: NEGATIVE
Resp Syncytial Virus by PCR: NEGATIVE
SARS Coronavirus 2 by RT PCR: NEGATIVE

## 2024-08-25 LAB — CBC WITH DIFFERENTIAL/PLATELET
Abs Immature Granulocytes: 0.12 K/uL — ABNORMAL HIGH (ref 0.00–0.07)
Basophils Absolute: 0.1 K/uL (ref 0.0–0.1)
Basophils Relative: 0 %
Eosinophils Absolute: 0.1 K/uL (ref 0.0–0.5)
Eosinophils Relative: 1 %
HCT: 38.6 % (ref 36.0–46.0)
Hemoglobin: 12.6 g/dL (ref 12.0–15.0)
Immature Granulocytes: 1 %
Lymphocytes Relative: 11 %
Lymphs Abs: 1.5 K/uL (ref 0.7–4.0)
MCH: 29.4 pg (ref 26.0–34.0)
MCHC: 32.6 g/dL (ref 30.0–36.0)
MCV: 90.2 fL (ref 80.0–100.0)
Monocytes Absolute: 0.8 K/uL (ref 0.1–1.0)
Monocytes Relative: 6 %
Neutro Abs: 11 K/uL — ABNORMAL HIGH (ref 1.7–7.7)
Neutrophils Relative %: 81 %
Platelets: 202 K/uL (ref 150–400)
RBC: 4.28 MIL/uL (ref 3.87–5.11)
RDW: 14.5 % (ref 11.5–15.5)
WBC: 13.6 K/uL — ABNORMAL HIGH (ref 4.0–10.5)
nRBC: 0 % (ref 0.0–0.2)

## 2024-08-25 LAB — COMPREHENSIVE METABOLIC PANEL WITH GFR
ALT: 56 U/L — ABNORMAL HIGH (ref 0–44)
AST: 51 U/L — ABNORMAL HIGH (ref 15–41)
Albumin: 4.4 g/dL (ref 3.5–5.0)
Alkaline Phosphatase: 132 U/L — ABNORMAL HIGH (ref 38–126)
Anion gap: 19 — ABNORMAL HIGH (ref 5–15)
BUN: 22 mg/dL (ref 8–23)
CO2: 24 mmol/L (ref 22–32)
Calcium: 9.1 mg/dL (ref 8.9–10.3)
Chloride: 92 mmol/L — ABNORMAL LOW (ref 98–111)
Creatinine, Ser: 6.02 mg/dL — ABNORMAL HIGH (ref 0.44–1.00)
GFR, Estimated: 7 mL/min — ABNORMAL LOW (ref >60)
Glucose, Bld: 115 mg/dL — ABNORMAL HIGH (ref 70–99)
Potassium: 3.7 mmol/L (ref 3.5–5.1)
Sodium: 135 mmol/L (ref 135–145)
Total Bilirubin: 0.6 mg/dL (ref 0.0–1.2)
Total Protein: 7.8 g/dL (ref 6.5–8.1)

## 2024-08-25 LAB — MAGNESIUM: Magnesium: 2.1 mg/dL (ref 1.7–2.4)

## 2024-08-25 LAB — LIPASE, BLOOD: Lipase: 22 U/L (ref 11–51)

## 2024-08-25 LAB — CBG MONITORING, ED: Glucose-Capillary: 110 mg/dL — ABNORMAL HIGH (ref 70–99)

## 2024-08-25 MED ORDER — DIPHENHYDRAMINE HCL 50 MG/ML IJ SOLN
25.0000 mg | Freq: Once | INTRAMUSCULAR | Status: AC
  Administered 2024-08-25: 25 mg via INTRAVENOUS
  Filled 2024-08-25: qty 1

## 2024-08-25 MED ORDER — HYDROMORPHONE HCL 1 MG/ML IJ SOLN
0.5000 mg | Freq: Once | INTRAMUSCULAR | Status: AC
  Administered 2024-08-25: 0.5 mg via INTRAVENOUS
  Filled 2024-08-25: qty 1

## 2024-08-25 MED ORDER — PROCHLORPERAZINE EDISYLATE 10 MG/2ML IJ SOLN
10.0000 mg | Freq: Once | INTRAMUSCULAR | Status: DC
Start: 2024-08-25 — End: 2024-08-25

## 2024-08-25 MED ORDER — PROCHLORPERAZINE EDISYLATE 10 MG/2ML IJ SOLN
10.0000 mg | Freq: Once | INTRAMUSCULAR | Status: AC
  Administered 2024-08-25: 10 mg via INTRAVENOUS
  Filled 2024-08-25: qty 2

## 2024-08-25 MED ORDER — SODIUM CHLORIDE 0.9 % IV BOLUS
1000.0000 mL | Freq: Once | INTRAVENOUS | Status: AC
  Administered 2024-08-25: 1000 mL via INTRAVENOUS

## 2024-08-25 NOTE — Discharge Instructions (Addendum)
 You were seen in the emergency room for abdominal pain and vomiting.  We treated you with nausea medicine, pain medicine, and IV fluids.  Your lab work looked stable.  You can use the Compazine  medicine to help with nausea for the next few days.  We recommend following up with your primary care doctor in the next day or so to monitor for ongoing symptoms.  We also recommend calling the gastroenterology doctors to schedule an appointment.

## 2024-08-25 NOTE — ED Triage Notes (Addendum)
 Ongoing generalized abdominal pains. Says unable to tolerate. They suspect is still result of colitis. Nausea and vomiting in triae.   Dialysis M, W, F with no missed appointments.   She says I just feel weak and unwell. 2L [place] supplemental oxygen.

## 2024-08-25 NOTE — ED Provider Notes (Signed)
 [place] EMERGENCY DEPARTMENT AT [facility] Provider Note   CSN: [id number] Arrival date & time: 08/25/24  2004     Patient presents with: Abdominal Pain   Nancy [name] is a 63 y.o. female.   Patient presents with ongoing abdominal pain that has been ongoing for several days.  Was seen in the ED on 9/7 for same thing, at that time reported abdominal pain recurrence following discharge from hospital for colitis at the end of August.  During that ED visit, labs were WNL and CTAP was negative.  Was discharged with short course of Percocet and Compazine .  States the Compazine  had been helping, the Percocet helped a little bit.  Reports nausea and vomiting all throughout the day, has not been able to eat or drink very much.  Has been attending dialysis on regular schedule though has had to cut sessions short due to abdominal pain (2 hr on Fri/Mon vs normal 4hr sessions).   Abdominal Pain      Prior to Admission medications   Medication Sig Start Date End Date Taking? Authorizing Provider  acetaminophen  (TYLENOL ) 500 MG tablet Take 1,000 mg by mouth every 8 (eight) hours as needed for mild pain (pain score 1-3), moderate pain (pain score 4-6) or headache.    [provider]  albuterol  (VENTOLIN  HFA) 108 (90 Base) MCG/ACT inhaler Inhale 2 puffs into the lungs every 6 (six) hours as needed. 08/21/23   [name], MD  ALPRAZolam  (XANAX ) 0.25 MG tablet Take 1 tablet (0.25 mg total) by mouth on Monday, Wednesday and Friday at hemodialysis. 06/10/24   [name], MD  amLODipine  (NORVASC ) 10 MG tablet Take 1 tablet (10 mg total) by mouth daily for hypertension 06/04/24   [name], MD  aspirin  81 MG EC tablet Take 1 tablet (81 mg total) by mouth daily. 09/26/21   [place], Harlene HERO, FNP  atorvastatin  (LIPITOR) 40 MG tablet Take 1 tablet (40 mg total) by mouth daily for cholesterol. 06/04/24   [name], MD  ezetimibe  (ZETIA ) 10 MG tablet Take 1  tablet (10 mg total) by mouth daily for cholesterol. 06/04/24   [name], MD  insulin  glargine (LANTUS  SOLOSTAR) 100 UNIT/ML Solostar Pen Inject 5 Units into the skin 2 (two) times daily. Increased by Provider on 6/24 06/16/24   [name], MD  isosorbide  mononitrate (IMDUR ) 30 MG 24 hr tablet Take 2 tablets (60 mg total) by mouth daily for blood pressure. 06/04/24   [name], MD  labetalol  (NORMODYNE ) 300 MG tablet Take 1 tablet (300 mg total) by mouth 2 (two) times daily. Patient taking differently: Take 300 mg by mouth daily. Take one tablet by mouth on Tues, Thurs, Saturday, and Sunday in the morning (non-dialysis days). Take one tablet by mouth on Mon, Wed,and Fri in the evening after dialysis. 06/04/24   [name], MD  levothyroxine  (SYNTHROID ) 25 MCG tablet Take 1 tablet (25 mcg total) by mouth daily for low thyroid  hormone. 06/04/24   [name], MD  Menthol, Topical Analgesic, (BIOFREEZE COOL THE PAIN) 4 % GEL Apply 1 Application topically as needed (Shoulder and knee pain.).    [provider]  omeprazole  (PRILOSEC) 20 MG capsule Take 2 capsules (40 mg total) by mouth daily for acid reflux. 07/13/24   [name], MD  ondansetron  (ZOFRAN ) 4 MG tablet Take 1 tablet (4 mg total) by mouth every 8 (eight) hours as needed for nausea. Patient not  taking: Reported on 08/21/2024 04/29/24     oxyCODONE -acetaminophen  (PERCOCET) 5-325 MG tablet Take 1 tablet by mouth every 4 (four) hours as needed for severe pain (pain score 7-10). 08/23/24   Raford Lenis, MD  polyethylene glycol powder (GLYCOLAX /MIRALAX ) 17 GM/SCOOP powder Mix 1 capful with 8 ounces of fluid and take by mouth daily as needed. 03/25/24   [name], MD  prochlorperazine  (COMPAZINE ) 10 MG tablet Take 1 tablet (10 mg total) by mouth every 6 (six) hours as needed for nausea or vomiting. 08/23/24   Raford Lenis, MD  sevelamer  carbonate (RENVELA ) 800 MG tablet Take 3 tablets (2,400 mg  total) by mouth 3 (three) times daily with meals and 1 tablet with snacks. 09/04/23     Insulin  NPH Isophane & Regular (RELION 70/30 [place]) Inject 35 Units into the skin 2 (two) times daily.  08/17/14  [provider]    Allergies: Hydralazine , Hydralazine  hcl, Hydrocodone, Metformin  and related, Other, Plaquenil [hydroxychloroquine sulfate], Shellfish allergy, Shellfish-derived products, Shrimp (diagnostic), Sulfa antibiotics, and Sulfur     Review of Systems  Gastrointestinal:  Positive for abdominal pain.    Updated Vital Signs BP (!) 193/115   Pulse 74   Temp 97.9 F (36.6 C)   Resp 20   Ht 5' 3 (1.6 m)   Wt 104.3 kg   SpO2 94%   BMI 40.74 kg/m   Physical Exam Constitutional:      Appearance: She is ill-appearing.  Cardiovascular:     Rate and Rhythm: Normal rate and regular rhythm.  Pulmonary:     Effort: Pulmonary effort is normal.     Breath sounds: Normal breath sounds.     Comments: On home 2L LFNC Abdominal:     General: Bowel sounds are normal. There is no distension.     Palpations: Abdomen is soft.     Tenderness: There is abdominal tenderness in the epigastric area. There is no guarding or rebound.  Skin:    General: Skin is warm and dry.  Neurological:     General: No focal deficit present.  Psychiatric:        Mood and Affect: Mood normal.        Behavior: Behavior normal.     (all labs ordered are listed, but only abnormal results are displayed) Labs Reviewed  COMPREHENSIVE METABOLIC PANEL WITH GFR - Abnormal; Notable for the following components:      Result Value   Chloride 92 (*)    Glucose, Bld 115 (*)    Creatinine, Ser 6.02 (*)    AST 51 (*)    ALT 56 (*)    Alkaline Phosphatase 132 (*)    GFR, Estimated 7 (*)    Anion gap 19 (*)    All other components within normal limits  CBC WITH DIFFERENTIAL/PLATELET - Abnormal; Notable for the following components:   WBC 13.6 (*)    Neutro Abs 11.0 (*)    Abs Immature Granulocytes 0.12 (*)     All other components within normal limits  CBG MONITORING, ED - Abnormal; Notable for the following components:   Glucose-Capillary 110 (*)    All other components within normal limits  RESP PANEL BY RT-PCR (RSV, FLU A&B, COVID)  RVPGX2  LIPASE, BLOOD  MAGNESIUM   URINALYSIS, ROUTINE W REFLEX MICROSCOPIC    EKG: EKG Interpretation Date/Time:  Tuesday August 25 2024 20:49:45 EDT Ventricular Rate:  79 PR Interval:  166 QRS Duration:  90 QT Interval:  449 QTC Calculation: 515 R  Axis:   -50  Text Interpretation: Sinus rhythm Left anterior fascicular block Probable anterior infarct, age indeterminate Prolonged QT interval Confirmed by Pamella Sharper [phone]) on 08/25/2024 9:24:14 PM  Radiology: [facility] Chest Portable 1 View Result Date: 08/25/2024 CLINICAL DATA:  Generalized abdominal pain EXAM: PORTABLE CHEST 1 VIEW COMPARISON:  07/24/2023 FINDINGS: Left dialysis catheter remains in place, unchanged. Heart and mediastinal contours within normal limits. Small left pleural effusion versus pleural thickening. No confluent airspace opacities, effusions or edema. No acute bony abnormality. IMPRESSION: Small left pleural effusion versus pleural thickening laterally. Electronically Signed   By: Franky Crease M.D.   On: 08/25/2024 21:37     Procedures   Medications Ordered in the ED  prochlorperazine  (COMPAZINE ) injection 10 mg (10 mg Intravenous Given 08/25/24 2228)  sodium chloride  0.9 % bolus 1,000 mL (1,000 mLs Intravenous New Bag/Given 08/25/24 2232)  HYDROmorphone  (DILAUDID ) injection 0.5 mg (0.5 mg Intravenous Given 08/25/24 2306)  diphenhydrAMINE  (BENADRYL ) injection 25 mg (25 mg Intravenous Given 08/25/24 2305)                                    Medical Decision Making Several days of nausea and vomiting in patient with ESRD, CHF, DM 2.  Differential includes DKA, HHS, volume overload, electrolyte abnormality, GI infection.  CTAP 9/7 with no acute abnormalities.  Labs today significant for  glucose WNL, slight leukocytosis of 13.6, lipase WNL, mag WNL.  Viral respiratory panel negative.  CMP with stable kidney function, elevated AST/ALT to 51/56. EKG with NSR and prolonged QT.  Giving 1 dose of Compazine  10 mg given risk of ongoing nausea/vomiting outweighing risk of further prolonging QT.  Chest x-ray showed small L pleural effusion versus pleural thickening laterally.  Will give 0.5mg  Dilaudid  and Benadryl  for ongoing abdominal pain.  Also given 1 L IVF.  Reevaluation after medications, patient feels better and agreeable to discharge home.  Advised that she can continue to use Compazine  from prior ED visit for ongoing nausea and vomiting.  Also recommended PCP follow-up and GI follow-up.  Amount and/or Complexity of Data Reviewed Labs: ordered. Radiology: ordered.  Risk Prescription drug management.       Final diagnoses:  None    ED Discharge Orders     None          Adele Song, MD 08/25/24 2332    Pamella Sharper LABOR, DO 09/03/24 0900

## 2024-08-27 ENCOUNTER — Encounter: Payer: Self-pay | Admitting: "Endocrinology

## 2024-08-27 ENCOUNTER — Ambulatory Visit (INDEPENDENT_AMBULATORY_CARE_PROVIDER_SITE_OTHER): Admitting: "Endocrinology

## 2024-08-27 ENCOUNTER — Other Ambulatory Visit: Payer: Self-pay

## 2024-08-27 VITALS — BP 120/80 | HR 87 | Ht 63.0 in | Wt 247.0 lb

## 2024-08-27 DIAGNOSIS — E1165 Type 2 diabetes mellitus with hyperglycemia: Secondary | ICD-10-CM | POA: Diagnosis not present

## 2024-08-27 DIAGNOSIS — Z794 Long term (current) use of insulin: Secondary | ICD-10-CM | POA: Diagnosis not present

## 2024-08-27 MED ORDER — FREESTYLE LIBRE 3 PLUS SENSOR MISC
3 refills | Status: AC
  Filled 2024-08-27: qty 6, 90d supply, fill #0

## 2024-08-27 MED ORDER — BAQSIMI ONE PACK 3 MG/DOSE NA POWD
1.0000 | NASAL | 3 refills | Status: AC | PRN
  Filled 2024-08-27: qty 2, 2d supply, fill #0

## 2024-08-27 NOTE — Progress Notes (Signed)
 Outpatient Endocrinology Note Nancy Birmingham, MD  08/27/24   Nancy [name] March 10, 1954 [id number]  Referring Provider: [name], MD Primary Care Provider: Tanda Bleacher, MD Reason for consultation: Subjective   Assessment & Plan  Diagnoses and all orders for this visit:  Uncontrolled type 2 diabetes mellitus with hyperglycemia (HCC) -     Microalbumin / creatinine urine ratio  Long-term insulin  use (HCC)  Other orders -     Continuous Glucose Sensor (FREESTYLE LIBRE 3 PLUS SENSOR) MISC; Change sensor every 15 days. -     Glucagon  (BAQSIMI  ONE PACK) 3 MG/DOSE POWD; Place 1 Device into the nose as needed (Low blood sugar with impaired consciousness).   Diabetes Type II complicated by ESRD, TIA?, No results found for: GFR Hba1c goal less than 7, current Hba1c is  Lab Results  Component Value Date   HGBA1C 8.0 (H) 08/10/2024   Will recommend the following: Lantus  5 units bid   No known contraindications/side effects to any of above medications Glucagon  discussed and prescribed with refills on 08/27/24  -Last LD and Tg are as follows: Lab Results  Component Value Date   LDLCALC 46 10/08/2023    Lab Results  Component Value Date   TRIG 95 10/08/2023   -On atorvastatin  40 mg every day, zetia  10 mg every day  -Follow low fat diet and exercise   -Blood pressure goal <140/90 - Microalbumin/creatinine goal is < 30 -Last MA/Cr is as follows: Lab Results  Component Value Date   MICROALBUR 80 10/29/2017   -not on ACE/ARB  -diet changes including salt restriction -limit eating outside -counseled BP targets per standards of diabetes care -uncontrolled blood pressure can lead to retinopathy, nephropathy and cardiovascular and atherosclerotic heart disease  Reviewed and counseled on: -A1C target -Blood sugar targets -Complications of uncontrolled diabetes  -Checking blood sugar before meals and bedtime and bring log next visit -All medications  with mechanism of action and side effects -Hypoglycemia management: rule of 15's, Glucagon  Emergency Kit and medical alert ID -low-carb low-fat plate-method diet -At least 20 minutes of physical activity per day -Annual dilated retinal eye exam and foot exam -compliance and follow up needs -follow up as scheduled or earlier if problem gets worse  Call if blood sugar is less than 70 or consistently above 250    Take a 15 gm snack of carbohydrate at bedtime before you go to sleep if your blood sugar is less than 100.    If you are going to fast after midnight for a test or procedure, ask your physician for instructions on how to reduce/decrease your insulin  dose.    Call if blood sugar is less than 70 or consistently above 250  -Treating a low sugar by rule of 15  (15 gms of sugar every 15 min until sugar is more than 70) If you feel your sugar is low, test your sugar to be sure If your sugar is low (less than 70), then take 15 grams of a fast acting Carbohydrate (3-4 glucose tablets or glucose gel or 4 ounces of juice or regular soda) Recheck your sugar 15 min after treating low to make sure it is more than 70 If sugar is still less than 70, treat again with 15 grams of carbohydrate          Don't drive the hour of hypoglycemia  If unconscious/unable to eat or drink by mouth, use glucagon  injection or nasal spray baqsimi  and call 911. Can repeat again  in 15 min if still unconscious.  Return in about 3 months (around 11/26/2024) for visit, labs today.   I have reviewed current medications, nurse's notes, allergies, vital signs, past medical and surgical history, family medical history, and social history for this encounter. Counseled patient on symptoms, examination findings, lab findings, imaging results, treatment decisions and monitoring and prognosis. The patient understood the recommendations and agrees with the treatment plan. All questions regarding treatment plan were fully  answered.  Nancy Birmingham, MD  08/27/24  History of Present Illness [name] is a 63 y.o. year old female who presents for evaluation of Type II diabetes mellitus.  [name] was first diagnosed around 2019. Diabetes education +  Home diabetes regimen: Lantus  5 units bid   COMPLICATIONS -  MI/ + Stroke/TIA -  retinopathy -  neuropathy +  nephropathy, ESRD on HD M/W/F  SYMPTOMS REVIEWED - Polyuria - Weight loss + Blurred vision  BLOOD SUGAR DATA Did not bring meter Checks about twice a day Per recall: 105-251  Physical Exam  BP 120/80   Pulse 87   Ht 5' 3 (1.6 m)   Wt 247 lb (112 kg)   SpO2 96%   BMI 43.75 kg/m    Constitutional: well developed, well nourished Head: normocephalic, atraumatic Eyes: sclera anicteric, no redness Neck: supple Lungs: normal respiratory effort Neurology: alert and oriented Skin: dry, no appreciable rashes Musculoskeletal: no appreciable defects Psychiatric: normal mood and affect Diabetic Foot Exam - Simple   No data filed      Current Medications Patient's Medications  New Prescriptions   CONTINUOUS GLUCOSE SENSOR (FREESTYLE LIBRE 3 PLUS SENSOR) MISC    Change sensor every 15 days.   GLUCAGON  (BAQSIMI  ONE PACK) 3 MG/DOSE POWD    Place 1 Device into the nose as needed (Low blood sugar with impaired consciousness).  Previous Medications   ACETAMINOPHEN  (TYLENOL ) 500 MG TABLET    Take 1,000 mg by mouth every 8 (eight) hours as needed for mild pain (pain score 1-3), moderate pain (pain score 4-6) or headache.   ALBUTEROL  (VENTOLIN  HFA) 108 (90 BASE) MCG/ACT INHALER    Inhale 2 puffs into the lungs every 6 (six) hours as needed.   ALPRAZOLAM  (XANAX ) 0.25 MG TABLET    Take 1 tablet (0.25 mg total) by mouth on Monday, Wednesday and Friday at hemodialysis.   AMLODIPINE  (NORVASC ) 10 MG TABLET    Take 1 tablet (10 mg total) by mouth daily for hypertension   ASPIRIN  81 MG EC TABLET    Take 1 tablet (81 mg total) by  mouth daily.   ATORVASTATIN  (LIPITOR) 40 MG TABLET    Take 1 tablet (40 mg total) by mouth daily for cholesterol.   CONTINUOUS GLUCOSE SENSOR (FREESTYLE LIBRE 3 SENSOR) MISC    by Does not apply route.   EZETIMIBE  (ZETIA ) 10 MG TABLET    Take 1 tablet (10 mg total) by mouth daily for cholesterol.   INSULIN  GLARGINE (LANTUS  SOLOSTAR) 100 UNIT/ML SOLOSTAR PEN    Inject 5 Units into the skin 2 (two) times daily. Increased by Provider on 6/24   ISOSORBIDE  MONONITRATE (IMDUR ) 30 MG 24 HR TABLET    Take 2 tablets (60 mg total) by mouth daily for blood pressure.   LABETALOL  (NORMODYNE ) 300 MG TABLET    Take 1 tablet (300 mg total) by mouth 2 (two) times daily.   LEVOTHYROXINE  (SYNTHROID ) 25 MCG TABLET    Take 1 tablet (25 mcg total) by mouth daily  for low thyroid  hormone.   MENTHOL, TOPICAL ANALGESIC, (BIOFREEZE COOL THE PAIN) 4 % GEL    Apply 1 Application topically as needed (Shoulder and knee pain.).   OMEPRAZOLE  (PRILOSEC) 20 MG CAPSULE    Take 2 capsules (40 mg total) by mouth daily for acid reflux.   ONDANSETRON  (ZOFRAN ) 4 MG TABLET    Take 1 tablet (4 mg total) by mouth every 8 (eight) hours as needed for nausea.   OXYCODONE -ACETAMINOPHEN  (PERCOCET) 5-325 MG TABLET    Take 1 tablet by mouth every 4 (four) hours as needed for severe pain (pain score 7-10).   POLYETHYLENE GLYCOL POWDER (GLYCOLAX /MIRALAX ) 17 GM/SCOOP POWDER    Mix 1 capful with 8 ounces of fluid and take by mouth daily as needed.   PROCHLORPERAZINE  (COMPAZINE ) 10 MG TABLET    Take 1 tablet (10 mg total) by mouth every 6 (six) hours as needed for nausea or vomiting.   SEVELAMER  CARBONATE (RENVELA ) 800 MG TABLET    Take 3 tablets (2,400 mg total) by mouth 3 (three) times daily with meals and 1 tablet with snacks.  Modified Medications   No medications on file  Discontinued Medications   No medications on file    Allergies Allergies  Allergen Reactions   Hydralazine  Other (See Comments)   Hydralazine  Hcl Other (See Comments)     Hair loss   Hydrocodone Itching and Other (See Comments)    Upset stomach   Metformin  And Related Nausea And Vomiting and Other (See Comments)    Stomach pains, also   Other Nausea Only and Other (See Comments)    Lettuce- Does not digest this!!   Plaquenil [Hydroxychloroquine Sulfate] Hives   Shellfish Allergy Nausea And Vomiting   Shellfish-Derived Products Nausea Only and Other (See Comments)    Caused an upset stomach   Shrimp (Diagnostic) Nausea Only and Other (See Comments)    Upset stomach    Sulfa Antibiotics Hives   Sulfur  Hives    Past Medical History Past Medical History:  Diagnosis Date   Anemia 2006   Anginal pain (HCC)    Carotid artery disease (HCC)    right ICA occlusion, 40-59% LICA 02/2021 US    CHF (congestive heart failure) (HCC)    Coronary artery disease    Depression 2014   previously on amitryptiline    Diabetes mellitus with neurological manifestation (HCC) 2006   Diabetic peripheral neuropathy (HCC) 04/26/2020   Dysphagia    Dyspnea    ESRD on hemodialysis (HCC)    Fracture of left ankle 1997   Gastroparesis 07/2016   Heart murmur    HOH (hard of hearing) 2004   Hyperlipidemia 2006   Hypertension 2006   IBS (irritable bowel syndrome) 2002   Leukopenia 2015   Macular degeneration 11/2019   Peripheral vascular disease (HCC)    Shingles 2009   Stroke [facility]) 2020   Thyroid  nodule 2004    Past Surgical History Past Surgical History:  Procedure Laterality Date   A/V FISTULAGRAM Right 09/25/2022   Procedure: A/V Fistulagram;  Surgeon: Serene Gaile ORN, MD;  Location: [facility] INVASIVE [facility];  Service: Cardiovascular;  Laterality: Right;   ABDOMINAL HYSTERECTOMY  2005   AMPUTATION Bilateral 04/04/2022   Procedure: BILATERAL BELOW KNEE AMPUTATION;  Surgeon: Harden Jerona GAILS, MD;  Location: [facility] OR;  Service: Orthopedics;  Laterality: Bilateral;   AV FISTULA PLACEMENT Right 12/22/2021   Procedure: RIGHT ARM ARTERIOVENOUS (AV) BRACIOCPHELAIC FISTULA  CREATION;  Surgeon: Sheree Penne Bruckner, MD;  Location:  [facility] OR;  Service: Vascular;  Laterality: Right;   AV FISTULA PLACEMENT Right 02/19/2022   Procedure: RIGHT ARTERIOVENOUS FISTULA;  Surgeon: Eliza Lonni RAMAN, MD;  Location: [facility] OR;  Service: Vascular;  Laterality: Right;   AV FISTULA PLACEMENT Left 12/24/2023   Procedure: LEFT BRACHIOCEPHALIC ARTERIOVENOUS (AV) FISTULA CREATION;  Surgeon: Lanis Fonda BRAVO, MD;  Location: [facility] OR;  Service: Vascular;  Laterality: Left;   BASCILIC VEIN TRANSPOSITION Right 08/07/2022   Procedure: SECOND STAGE RIGHT BASILIC VEIN TRANSPOSITION;  Surgeon: Eliza Lonni RAMAN, MD;  Location: [facility] OR;  Service: Vascular;  Laterality: Right;   CATARACT EXTRACTION Left 11/2019   CESAREAN SECTION  1983   CHOLECYSTECTOMY N/A 03/05/2020   Procedure: LAPAROSCOPIC CHOLECYSTECTOMY WITH INTRAOPERATIVE CHOLANGIOGRAM;  Surgeon: Belinda Cough, MD;  Location: [facility] ORS;  Service: General;  Laterality: N/A;   COLONOSCOPY     [facility] said no polyps were found   ESOPHAGOGASTRODUODENOSCOPY (EGD) WITH PROPOFOL  Left 08/26/2014   Procedure: ESOPHAGOGASTRODUODENOSCOPY (EGD) WITH PROPOFOL ;  Surgeon: Elsie Cree, MD;  Location: [facility] ENDOSCOPY;  Service: Endoscopy;  Laterality: Left;   ESOPHAGOGASTRODUODENOSCOPY (EGD) WITH PROPOFOL  N/A 03/03/2020   Procedure: ESOPHAGOGASTRODUODENOSCOPY (EGD) WITH PROPOFOL ;  Surgeon: Albertus Gordy HERO, MD;  Location: [facility] ENDOSCOPY;  Service: Gastroenterology;  Laterality: N/A;   IR FLUORO GUIDE CV LINE RIGHT  02/15/2022   IR US  GUIDE VASC ACCESS RIGHT  02/15/2022   PERIPHERAL VASCULAR BALLOON ANGIOPLASTY Right 09/25/2022   Procedure: PERIPHERAL VASCULAR BALLOON ANGIOPLASTY;  Surgeon: Serene Gaile ORN, MD;  Location: [facility];  Service: Cardiovascular;  Laterality: Right;  AVF   RIGHT HEART CATH N/A 07/14/2021   Procedure: RIGHT HEART CATH;  Surgeon: Rolan Ezra RAMAN, MD;  Location: [facility] INVASIVE CV LAB;  Service: Cardiovascular;  Laterality:  N/A;   RIGHT/LEFT HEART CATH AND CORONARY ANGIOGRAPHY N/A 04/27/2021   Procedure: RIGHT/LEFT HEART CATH AND CORONARY ANGIOGRAPHY;  Surgeon: Court Dorn PARAS, MD;  Location: [facility];  Service: Cardiovascular;  Laterality: N/A;    Family History family history includes Breast cancer in her maternal aunt and maternal grandmother; Colon cancer in her maternal grandfather; Congestive Heart Failure in her mother; Diabetes in her father and mother; Heart attack in her sister; Heart disease in her brother and mother; Hyperlipidemia in her brother; Hypertension in her brother and mother; Thyroid  disease in her mother.  Social History Social History   Socioeconomic History   Marital status: Legally Separated    Spouse name: Lamar   Number of children: 1   Years of education: some colle   Highest education level: Not on file  Occupational History   Occupation: Employed FT as Warden/ranger    Comment: [employer] , NA  Tobacco Use   Smoking status: Former    Current packs/day: 0.50    Average packs/day: 0.5 packs/day for 44.2 years (22.0 ttl pk-yrs)    Types: Cigarettes    Start date: 12/16/1980    Quit date: 12/16/1974    Passive exposure: Never   Smokeless tobacco: Never   Tobacco comments:    Former smoker 11/03/21  Vaping Use   Vaping status: Never Used  Substance and Sexual Activity   Alcohol use: No   Drug use: No   Sexual activity: Not Currently    Birth control/protection: None  Other Topics Concern   Not on file  Social History Narrative   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    Social Drivers of Corporate investment banker  Strain: Low Risk  (02/04/2024)   Overall Financial Resource Strain (CARDIA)    Difficulty of Paying Living Expenses: Not hard at all  Food Insecurity: No Food Insecurity (08/11/2024)   Hunger Vital Sign    Worried About Running Out of Food in the Last Year: Never true    Ran Out of Food in the Last Year:  Never true  Transportation Needs: No Transportation Needs (08/11/2024)   PRAPARE - Administrator, Civil Service (Medical): No    Lack of Transportation (Non-Medical): No  Physical Activity: Inactive (02/04/2024)   Exercise Vital Sign    Days of Exercise per Week: 0 days    Minutes of Exercise per Session: 0 min  Stress: No Stress Concern Present (02/04/2024)   [employer] of Occupational Health - Occupational Stress Questionnaire    Feeling of Stress : Not at all  Social Connections: Moderately Integrated (02/04/2024)   Social Connection and Isolation Panel    Frequency of Communication with Friends and Family: Twice a week    Frequency of Social Gatherings with Friends and Family: Once a week    Attends Religious Services: Never    Database administrator or Organizations: Yes    Attends Banker Meetings: Never    Marital Status: Married  Catering manager Violence: Not At Risk (08/11/2024)   Humiliation, Afraid, Rape, and Kick questionnaire    Fear of Current or Ex-Partner: No    Emotionally Abused: No    Physically Abused: No    Sexually Abused: No    Lab Results  Component Value Date   HGBA1C 8.0 (H) 08/10/2024   HGBA1C 7.7 (A) 06/04/2024   HGBA1C 7.9 (A) 12/03/2023   Lab Results  Component Value Date   CHOL 110 10/08/2023   Lab Results  Component Value Date   HDL 45 10/08/2023   Lab Results  Component Value Date   LDLCALC 46 10/08/2023   Lab Results  Component Value Date   TRIG 95 10/08/2023   Lab Results  Component Value Date   CHOLHDL 2.4 10/08/2023   Lab Results  Component Value Date   CREATININE 6.02 (H) 08/25/2024   No results found for: GFR Lab Results  Component Value Date   MICROALBUR 80 10/29/2017      Component Value Date/Time   NA 135 08/25/2024 2217   NA 136 10/20/2021 1206   K 3.7 08/25/2024 2217   CL 92 (L) 08/25/2024 2217   CO2 24 08/25/2024 2217   GLUCOSE 115 (H) 08/25/2024 2217   BUN 22 08/25/2024  2217   BUN 36 (H) 10/20/2021 1206   CREATININE 6.02 (H) 08/25/2024 2217   CREATININE 0.73 08/17/2014 1641   CALCIUM  9.1 08/25/2024 2217   CALCIUM  8.3 (L) 09/07/2021 1129   PROT 7.8 08/25/2024 2217   PROT 6.6 06/30/2021 1226   ALBUMIN  4.4 08/25/2024 2217   ALBUMIN  3.5 (L) 06/30/2021 1226   AST 51 (H) 08/25/2024 2217   ALT 56 (H) 08/25/2024 2217   ALKPHOS 132 (H) 08/25/2024 2217   BILITOT 0.6 08/25/2024 2217   BILITOT 0.3 06/30/2021 1226   GFRNONAA 7 (L) 08/25/2024 2217   GFRAA 58 (L) 05/06/2020 1207      Latest Ref Rng & Units 08/25/2024   10:17 PM 08/23/2024   12:32 AM 08/11/2024    5:42 AM  BMP  Glucose 70 - 99 mg/dL 884  881  874   BUN 8 - 23 mg/dL 22  13  33  Creatinine 0.44 - 1.00 mg/dL 3.97  4.73  3.05   Sodium 135 - 145 mmol/L 135  139  132   Potassium 3.5 - 5.1 mmol/L 3.7  4.0  4.2   Chloride 98 - 111 mmol/L 92  97  93   CO2 22 - 32 mmol/L 24  25  26    Calcium  8.9 - 10.3 mg/dL 9.1  9.0  8.9        Component Value Date/Time   WBC 13.6 (H) 08/25/2024 2217   RBC 4.28 08/25/2024 2217   HGB 12.6 08/25/2024 2217   HGB 10.1 (L) 06/04/2024 1222   HGB 12.5 07/08/2008 1551   HCT 38.6 08/25/2024 2217   HCT 32.3 (L) 06/04/2024 1222   HCT 37.3 07/08/2008 1551   PLT 202 08/25/2024 2217   PLT 185 06/04/2024 1222   MCV 90.2 08/25/2024 2217   MCV 92 06/04/2024 1222   MCV 84.7 07/08/2008 1551   MCH 29.4 08/25/2024 2217   MCHC 32.6 08/25/2024 2217   RDW 14.5 08/25/2024 2217   RDW 12.9 06/04/2024 1222   RDW 13.0 07/08/2008 1551   LYMPHSABS 1.5 08/25/2024 2217   LYMPHSABS 2.5 02/23/2021 1523   LYMPHSABS 4.6 (H) 07/08/2008 1551   MONOABS 0.8 08/25/2024 2217   MONOABS 0.6 07/08/2008 1551   EOSABS 0.1 08/25/2024 2217   EOSABS 0.5 (H) 02/23/2021 1523   BASOSABS 0.1 08/25/2024 2217   BASOSABS 0.1 02/23/2021 1523   BASOSABS 0.1 07/08/2008 1551     Parts of this note may have been dictated using voice recognition software. There may be variances in spelling and vocabulary  which are unintentional. Not all errors are proofread. Please notify the dino if any discrepancies are noted or if the meaning of any statement is not clear.

## 2024-08-27 NOTE — Patient Instructions (Signed)

## 2024-08-28 LAB — MICROALBUMIN / CREATININE URINE RATIO
Creatinine, Urine: 83 mg/dL (ref 20–275)
Microalb Creat Ratio: 1890 mg/g{creat} — ABNORMAL HIGH (ref <30)
Microalb, Ur: 156.9 mg/dL

## 2024-08-31 ENCOUNTER — Encounter: Payer: Self-pay | Admitting: Gastroenterology

## 2024-09-02 ENCOUNTER — Ambulatory Visit: Admitting: Nurse Practitioner

## 2024-09-03 ENCOUNTER — Other Ambulatory Visit: Payer: Self-pay

## 2024-09-03 ENCOUNTER — Other Ambulatory Visit (HOSPITAL_COMMUNITY): Payer: Self-pay | Admitting: Emergency Medicine

## 2024-09-03 ENCOUNTER — Other Ambulatory Visit (HOSPITAL_COMMUNITY): Payer: Self-pay

## 2024-09-03 NOTE — Progress Notes (Signed)
 Paramedicine Encounter    Patient ID: Nancy [name], female    DOB: 07/24/1961, 63 y.o.   MRN: [id number]   Complaints NONE  Assessment A&O x 4, skin W&D w/ good color.  Denies chest pain or SOB.  Lung sounds clear and equal bilat. No peripheral edema noted  Compliance with meds YES  Pill box filled x 1 week  Refills needed NONE  Meds changes since last visit NONE    Social changes NONE   BP (!) 140/100 (BP Location: Left Arm, Patient Position: Sitting, Cuff Size: Normal)   Pulse 78   Resp 16   SpO2 96%  Weight yesterday- not taken Last visit weight-247lb  ACTION: Home visit completed  [name] [phone] 09/03/24  Patient Care Team: Tanda Bleacher, MD as PCP - General (Family Medicine) Court Dorn PARAS, MD as PCP - Cardiology (Cardiology) Rolan Ezra RAMAN, MD as PCP - Advanced Heart Failure (Cardiology) Center, [facility]  Patient Active Problem List   Diagnosis Date Noted   Acute colitis 08/10/2024   Rectal bleeding 08/10/2024   Abnormal CT scan, gastrointestinal tract 08/10/2024   Irritable bowel syndrome with constipation 06/04/2024   S/P bilateral BKA (below knee amputation) (HCC) 02/04/2024   History of CVA (cerebrovascular accident) 03/22/2023   Community acquired pneumonia 03/22/2023   Pain, unspecified 08/15/2022   Complication of vascular dialysis catheter 08/07/2022   Chest pain 05/30/2022   Fluid overload 05/30/2022   Iron  deficiency anemia, unspecified 04/26/2022   Nausea 04/10/2022   Hyponatremia 04/04/2022   Prolonged QT interval 04/04/2022   Anaphylactic shock, unspecified, initial encounter 02/23/2022   Other allergy, initial encounter 02/23/2022   Pressure injury of skin 02/19/2022   Anemia in chronic kidney disease 02/16/2022   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) 02/16/2022   Hyperlipidemia, unspecified 02/16/2022   Hypertensive heart and chronic kidney disease with heart failure and with stage 5 chronic  kidney disease, or end stage renal disease (HCC) 02/16/2022   Personal history of nicotine dependence 02/16/2022   Personal history of transient ischemic attack (TIA), and cerebral infarction without residual deficits 02/16/2022   Secondary hyperparathyroidism of renal origin (HCC) 02/16/2022   Renal anasarca 02/14/2022   ESRD on dialysis (HCC) 02/14/2022   Hyperkalemia 02/14/2022   S/P arteriovenous (AV) fistula creation 12/22/2021   Peripheral artery disease (HCC) 10/27/2021   Acute exacerbation of congestive heart failure (HCC) 10/24/2021   Acute cardiogenic pulmonary edema (HCC) 10/06/2021   CAD (coronary artery disease) 10/06/2021   Goals of care, counseling/discussion    Gangrene of left foot (HCC)    Cellulitis of left foot    CHF (congestive heart failure) (HCC) 07/07/2021   Acute postoperative anemia due to expected blood loss superimposed on anemia of chronic renal insufficiency 06/30/2021   CKD (chronic kidney disease), stage IV (HCC) 06/11/2021   Chronic diastolic CHF (congestive heart failure) (HCC) 06/10/2021   Abnormal nuclear stress test    Dyspnea on exertion 03/07/2021   Orthopnea 02/25/2021   History of cerebrovascular accident (CVA) with residual deficit 05/06/2020   AKI (acute kidney injury) (HCC) 04/20/2020   Generalized weakness 04/20/2020   Dehydration 04/20/2020   Generalized abdominal pain    Pancreatitis, acute 02/29/2020   Cerebrovascular accident (CVA) (HCC) 11/08/2019   Stenosis of right carotid artery 11/08/2019   Cortical age-related cataract of both eyes 08/30/2019   Gait abnormality 08/30/2019   Statin declined 08/30/2019   Gastroesophageal reflux disease without esophagitis 04/18/2018   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   Insulin   dependent type 2 diabetes mellitus (HCC) 10/29/2017   History of macular degeneration 10/29/2017   Chronic cervical pain 10/29/2016   Myalgia 09/14/2016   Insomnia 09/14/2016   Tinea pedis of both feet 08/20/2016   Macular  degeneration 08/17/2016   Low back pain 09/21/2014   Hypertensive urgency 09/21/2014   HTN (hypertension) 09/08/2014   Thyroid  nodule 08/17/2014   Morbid obesity (HCC) 08/17/2014   Hypokalemia 08/06/2014   Type II diabetes mellitus with neurological manifestations, uncontrolled 08/04/2014    Current Outpatient Medications:    acetaminophen  (TYLENOL ) 500 MG tablet, Take 1,000 mg by mouth every 8 (eight) hours as needed for mild pain (pain score 1-3), moderate pain (pain score 4-6) or headache., Disp: , Rfl:    albuterol  (VENTOLIN  HFA) 108 (90 Base) MCG/ACT inhaler, Inhale 2 puffs into the lungs every 6 (six) hours as needed., Disp: 18 g, Rfl: 3   ALPRAZolam  (XANAX ) 0.25 MG tablet, Take 1 tablet (0.25 mg total) by mouth on Monday, Wednesday and Friday at hemodialysis., Disp: 12 tablet, Rfl: 2   amLODipine  (NORVASC ) 10 MG tablet, Take 1 tablet (10 mg total) by mouth daily for hypertension, Disp: 90 tablet, Rfl: 1   aspirin  81 MG EC tablet, Take 1 tablet (81 mg total) by mouth daily., Disp: 60 tablet, Rfl: 1   atorvastatin  (LIPITOR) 40 MG tablet, Take 1 tablet (40 mg total) by mouth daily for cholesterol., Disp: 90 tablet, Rfl: 1   ezetimibe  (ZETIA ) 10 MG tablet, Take 1 tablet (10 mg total) by mouth daily for cholesterol., Disp: 90 tablet, Rfl: 1   insulin  glargine (LANTUS  SOLOSTAR) 100 UNIT/ML Solostar Pen, Inject 5 Units into the skin 2 (two) times daily. Increased by Provider on 6/24, Disp: 15 mL, Rfl: 2   isosorbide  mononitrate (IMDUR ) 30 MG 24 hr tablet, Take 2 tablets (60 mg total) by mouth daily for blood pressure., Disp: 180 tablet, Rfl: 1   labetalol  (NORMODYNE ) 300 MG tablet, Take 1 tablet (300 mg total) by mouth 2 (two) times daily. (Patient taking differently: Take 300 mg by mouth daily. Take one tablet by mouth on Tues, Thurs, Saturday, and Sunday in the morning (non-dialysis days). Take one tablet by mouth on Mon, Wed,and Fri in the evening after dialysis.), Disp: 180 tablet, Rfl: 1    levothyroxine  (SYNTHROID ) 25 MCG tablet, Take 1 tablet (25 mcg total) by mouth daily for low thyroid  hormone., Disp: 90 tablet, Rfl: 1   omeprazole  (PRILOSEC) 20 MG capsule, Take 2 capsules (40 mg total) by mouth daily for acid reflux., Disp: 180 capsule, Rfl: 0   ondansetron  (ZOFRAN ) 4 MG tablet, Take 1 tablet (4 mg total) by mouth every 8 (eight) hours as needed for nausea., Disp: 90 tablet, Rfl: 3   oxyCODONE -acetaminophen  (PERCOCET) 5-325 MG tablet, Take 1 tablet by mouth every 4 (four) hours as needed for severe pain (pain score 7-10)., Disp: 10 tablet, Rfl: 0   polyethylene glycol powder (GLYCOLAX /MIRALAX ) 17 GM/SCOOP powder, Mix 1 capful with 8 ounces of fluid and take by mouth daily as needed., Disp: 3350 g, Rfl: 1   prochlorperazine  (COMPAZINE ) 10 MG tablet, Take 1 tablet (10 mg total) by mouth every 6 (six) hours as needed for nausea or vomiting., Disp: 20 tablet, Rfl: 0   sevelamer  carbonate (RENVELA ) 800 MG tablet, Take 3 tablets (2,400 mg total) by mouth 3 (three) times daily with meals and 1 tablet with snacks., Disp: 1080 tablet, Rfl: 3   Continuous Glucose Sensor (FREESTYLE LIBRE 3 PLUS SENSOR) MISC, Change  sensor every 15 days., Disp: 6 each, Rfl: 3   Continuous Glucose Sensor (FREESTYLE LIBRE 3 SENSOR) MISC, by Does not apply route., Disp: , Rfl:    Glucagon  (BAQSIMI  ONE PACK) 3 MG/DOSE POWD, Place 1 Device into the nose as needed (Low blood sugar with impaired consciousness). (Patient not taking: Reported on 09/03/2024), Disp: 2 each, Rfl: 3   Menthol, Topical Analgesic, (BIOFREEZE COOL THE PAIN) 4 % GEL, Apply 1 Application topically as needed (Shoulder and knee pain.). (Patient not taking: Reported on 09/03/2024), Disp: , Rfl:  Allergies  Allergen Reactions   Hydralazine  Other (See Comments)   Hydralazine  Hcl Other (See Comments)    Hair loss   Hydrocodone Itching and Other (See Comments)    Upset stomach   Metformin  And Related Nausea And Vomiting and Other (See Comments)     Stomach pains, also   Other Nausea Only and Other (See Comments)    Lettuce- Does not digest this!!   Plaquenil [Hydroxychloroquine Sulfate] Hives   Shellfish Allergy Nausea And Vomiting   Shellfish-Derived Products Nausea Only and Other (See Comments)    Caused an upset stomach   Shrimp (Diagnostic) Nausea Only and Other (See Comments)    Upset stomach    Sulfa Antibiotics Hives   Sulfur  Hives     Social History   Socioeconomic History   Marital status: Legally Separated    Spouse name: Lamar   Number of children: 1   Years of education: some colle   Highest education level: Not on file  Occupational History   Occupation: Employed FT as Warden/ranger    Comment: [employer] , NA  Tobacco Use   Smoking status: Former    Current packs/day: 0.50    Average packs/day: 0.5 packs/day for 44.2 years (22.0 ttl pk-yrs)    Types: Cigarettes    Start date: 12/16/1980    Quit date: 12/16/1974    Passive exposure: Never   Smokeless tobacco: Never   Tobacco comments:    Former smoker 11/03/21  Vaping Use   Vaping status: Never Used  Substance and Sexual Activity   Alcohol use: No   Drug use: No   Sexual activity: Not Currently    Birth control/protection: None  Other Topics Concern   Not on file  Social History Narrative   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    Social Drivers of Health   Financial Resource Strain: Low Risk  (02/04/2024)   Overall Financial Resource Strain (CARDIA)    Difficulty of Paying Living Expenses: Not hard at all  Food Insecurity: No Food Insecurity (08/11/2024)   Hunger Vital Sign    Worried About Running Out of Food in the Last Year: Never true    Ran Out of Food in the Last Year: Never true  Transportation Needs: No Transportation Needs (08/11/2024)   PRAPARE - Administrator, Civil Service (Medical): No    Lack of Transportation (Non-Medical): No  Physical Activity: Inactive (02/04/2024)   Exercise  Vital Sign    Days of Exercise per Week: 0 days    Minutes of Exercise per Session: 0 min  Stress: No Stress Concern Present (02/04/2024)   [employer] of Occupational Health - Occupational Stress Questionnaire    Feeling of Stress : Not at all  Social Connections: Moderately Integrated (02/04/2024)   Social Connection and Isolation Panel    Frequency of Communication with Friends and Family: Twice a week    Frequency  of Social Gatherings with Friends and Family: Once a week    Attends Religious Services: Never    Database administrator or Organizations: Yes    Attends Banker Meetings: Never    Marital Status: Married  Catering manager Violence: Not At Risk (08/11/2024)   Humiliation, Afraid, Rape, and Kick questionnaire    Fear of Current or Ex-Partner: No    Emotionally Abused: No    Physically Abused: No    Sexually Abused: No    Physical Exam      Future Appointments  Date Time Provider Department Center  09/17/2024 11:00 AM [facility]  09/17/2024 11:40 AM Lanis Fonda BRAVO, MD [facility]  10/06/2024 11:10 AM [name], MD [facility] [address]  12/01/2024  1:00 PM Dartha Ernst, MD [facility] None

## 2024-09-08 ENCOUNTER — Other Ambulatory Visit (HOSPITAL_COMMUNITY): Payer: Self-pay | Admitting: Emergency Medicine

## 2024-09-08 ENCOUNTER — Other Ambulatory Visit: Payer: Self-pay

## 2024-09-08 NOTE — Progress Notes (Unsigned)
 Today is med rec only.  Pt was sleeping on my arrival and her husband did not wish to wake her. Med box reconciled x 1 week w/o incident.   Also, discussed w/ Mr. Pun that Freestyle 3 sensors have been approved through medicare and he can pick them up at [facility].  Requested he call me when he has them in his possession and I will drop by and demonstrate how to apply them.  He advises he will do same.    Mary Sharps, EMT-Paramedic [phone] 09/10/2024

## 2024-09-14 ENCOUNTER — Emergency Department (HOSPITAL_BASED_OUTPATIENT_CLINIC_OR_DEPARTMENT_OTHER)

## 2024-09-14 ENCOUNTER — Inpatient Hospital Stay (HOSPITAL_BASED_OUTPATIENT_CLINIC_OR_DEPARTMENT_OTHER)
Admission: EM | Admit: 2024-09-14 | Discharge: 2024-09-18 | DRG: 073 | Disposition: A | Discharge status: Home / Self Care | Attending: Internal Medicine | Admitting: Internal Medicine

## 2024-09-14 ENCOUNTER — Other Ambulatory Visit: Payer: Self-pay

## 2024-09-14 DIAGNOSIS — E039 Hypothyroidism, unspecified: Secondary | ICD-10-CM | POA: Diagnosis present

## 2024-09-14 DIAGNOSIS — Z833 Family history of diabetes mellitus: Secondary | ICD-10-CM

## 2024-09-14 DIAGNOSIS — Z794 Long term (current) use of insulin: Secondary | ICD-10-CM

## 2024-09-14 DIAGNOSIS — Z8619 Personal history of other infectious and parasitic diseases: Secondary | ICD-10-CM

## 2024-09-14 DIAGNOSIS — Z888 Allergy status to other drugs, medicaments and biological substances status: Secondary | ICD-10-CM

## 2024-09-14 DIAGNOSIS — Z6841 Body Mass Index (BMI) 40.0 and over, adult: Secondary | ICD-10-CM

## 2024-09-14 DIAGNOSIS — K219 Gastro-esophageal reflux disease without esophagitis: Secondary | ICD-10-CM | POA: Diagnosis present

## 2024-09-14 DIAGNOSIS — D631 Anemia in chronic kidney disease: Secondary | ICD-10-CM | POA: Diagnosis present

## 2024-09-14 DIAGNOSIS — H3530 Unspecified macular degeneration: Secondary | ICD-10-CM | POA: Diagnosis present

## 2024-09-14 DIAGNOSIS — E1151 Type 2 diabetes mellitus with diabetic peripheral angiopathy without gangrene: Secondary | ICD-10-CM | POA: Diagnosis present

## 2024-09-14 DIAGNOSIS — I5032 Chronic diastolic (congestive) heart failure: Secondary | ICD-10-CM | POA: Diagnosis present

## 2024-09-14 DIAGNOSIS — R1084 Generalized abdominal pain: Secondary | ICD-10-CM | POA: Diagnosis present

## 2024-09-14 DIAGNOSIS — E1143 Type 2 diabetes mellitus with diabetic autonomic (poly)neuropathy: Principal | ICD-10-CM | POA: Diagnosis present

## 2024-09-14 DIAGNOSIS — F419 Anxiety disorder, unspecified: Secondary | ICD-10-CM | POA: Diagnosis present

## 2024-09-14 DIAGNOSIS — K3184 Gastroparesis: Secondary | ICD-10-CM | POA: Diagnosis present

## 2024-09-14 DIAGNOSIS — I160 Hypertensive urgency: Secondary | ICD-10-CM | POA: Diagnosis present

## 2024-09-14 DIAGNOSIS — D72829 Elevated white blood cell count, unspecified: Secondary | ICD-10-CM | POA: Diagnosis present

## 2024-09-14 DIAGNOSIS — R112 Nausea with vomiting, unspecified: Secondary | ICD-10-CM | POA: Diagnosis not present

## 2024-09-14 DIAGNOSIS — Z83438 Family history of other disorder of lipoprotein metabolism and other lipidemia: Secondary | ICD-10-CM

## 2024-09-14 DIAGNOSIS — E1122 Type 2 diabetes mellitus with diabetic chronic kidney disease: Secondary | ICD-10-CM | POA: Diagnosis present

## 2024-09-14 DIAGNOSIS — Z87891 Personal history of nicotine dependence: Secondary | ICD-10-CM

## 2024-09-14 DIAGNOSIS — Z89511 Acquired absence of right leg below knee: Secondary | ICD-10-CM

## 2024-09-14 DIAGNOSIS — Z885 Allergy status to narcotic agent status: Secondary | ICD-10-CM

## 2024-09-14 DIAGNOSIS — E1142 Type 2 diabetes mellitus with diabetic polyneuropathy: Secondary | ICD-10-CM | POA: Diagnosis present

## 2024-09-14 DIAGNOSIS — Z882 Allergy status to sulfonamides status: Secondary | ICD-10-CM

## 2024-09-14 DIAGNOSIS — Z8349 Family history of other endocrine, nutritional and metabolic diseases: Secondary | ICD-10-CM

## 2024-09-14 DIAGNOSIS — Z66 Do not resuscitate: Secondary | ICD-10-CM | POA: Diagnosis present

## 2024-09-14 DIAGNOSIS — I132 Hypertensive heart and chronic kidney disease with heart failure and with stage 5 chronic kidney disease, or end stage renal disease: Secondary | ICD-10-CM | POA: Diagnosis present

## 2024-09-14 DIAGNOSIS — Z7982 Long term (current) use of aspirin: Secondary | ICD-10-CM

## 2024-09-14 DIAGNOSIS — Z91018 Allergy to other foods: Secondary | ICD-10-CM

## 2024-09-14 DIAGNOSIS — N186 End stage renal disease: Secondary | ICD-10-CM | POA: Diagnosis present

## 2024-09-14 DIAGNOSIS — R1013 Epigastric pain: Secondary | ICD-10-CM

## 2024-09-14 DIAGNOSIS — E1169 Type 2 diabetes mellitus with other specified complication: Secondary | ICD-10-CM | POA: Diagnosis not present

## 2024-09-14 DIAGNOSIS — Z90710 Acquired absence of both cervix and uterus: Secondary | ICD-10-CM

## 2024-09-14 DIAGNOSIS — K581 Irritable bowel syndrome with constipation: Secondary | ICD-10-CM | POA: Diagnosis present

## 2024-09-14 DIAGNOSIS — I2510 Atherosclerotic heart disease of native coronary artery without angina pectoris: Secondary | ICD-10-CM | POA: Diagnosis present

## 2024-09-14 DIAGNOSIS — I2489 Other forms of acute ischemic heart disease: Secondary | ICD-10-CM | POA: Diagnosis present

## 2024-09-14 DIAGNOSIS — Z1152 Encounter for screening for COVID-19: Secondary | ICD-10-CM

## 2024-09-14 DIAGNOSIS — Z79899 Other long term (current) drug therapy: Secondary | ICD-10-CM

## 2024-09-14 DIAGNOSIS — Z992 Dependence on renal dialysis: Secondary | ICD-10-CM | POA: Diagnosis not present

## 2024-09-14 DIAGNOSIS — Z91013 Allergy to seafood: Secondary | ICD-10-CM

## 2024-09-14 DIAGNOSIS — E785 Hyperlipidemia, unspecified: Secondary | ICD-10-CM | POA: Diagnosis present

## 2024-09-14 DIAGNOSIS — Z8673 Personal history of transient ischemic attack (TIA), and cerebral infarction without residual deficits: Secondary | ICD-10-CM

## 2024-09-14 DIAGNOSIS — I272 Pulmonary hypertension, unspecified: Secondary | ICD-10-CM | POA: Diagnosis present

## 2024-09-14 DIAGNOSIS — Z8249 Family history of ischemic heart disease and other diseases of the circulatory system: Secondary | ICD-10-CM

## 2024-09-14 DIAGNOSIS — Z89512 Acquired absence of left leg below knee: Secondary | ICD-10-CM

## 2024-09-14 DIAGNOSIS — E119 Type 2 diabetes mellitus without complications: Secondary | ICD-10-CM

## 2024-09-14 DIAGNOSIS — Z79890 Hormone replacement therapy (postmenopausal): Secondary | ICD-10-CM

## 2024-09-14 DIAGNOSIS — F32A Depression, unspecified: Secondary | ICD-10-CM | POA: Diagnosis present

## 2024-09-14 DIAGNOSIS — Z8719 Personal history of other diseases of the digestive system: Secondary | ICD-10-CM

## 2024-09-14 LAB — CBC WITH DIFFERENTIAL/PLATELET
Abs Immature Granulocytes: 0.06 K/uL (ref 0.00–0.07)
Basophils Absolute: 0.1 K/uL (ref 0.0–0.1)
Basophils Relative: 1 %
Eosinophils Absolute: 0.2 K/uL (ref 0.0–0.5)
Eosinophils Relative: 2 %
HCT: 37.7 % (ref 36.0–46.0)
Hemoglobin: 12 g/dL (ref 12.0–15.0)
Immature Granulocytes: 1 %
Lymphocytes Relative: 9 %
Lymphs Abs: 1.2 K/uL (ref 0.7–4.0)
MCH: 29.6 pg (ref 26.0–34.0)
MCHC: 31.8 g/dL (ref 30.0–36.0)
MCV: 93.1 fL (ref 80.0–100.0)
Monocytes Absolute: 0.6 K/uL (ref 0.1–1.0)
Monocytes Relative: 5 %
Neutro Abs: 11 K/uL — ABNORMAL HIGH (ref 1.7–7.7)
Neutrophils Relative %: 82 %
Platelets: 229 K/uL (ref 150–400)
RBC: 4.05 MIL/uL (ref 3.87–5.11)
RDW: 15.6 % — ABNORMAL HIGH (ref 11.5–15.5)
WBC: 13.1 K/uL — ABNORMAL HIGH (ref 4.0–10.5)
nRBC: 0 % (ref 0.0–0.2)

## 2024-09-14 LAB — COMPREHENSIVE METABOLIC PANEL WITH GFR
ALT: 15 U/L (ref 0–44)
AST: 18 U/L (ref 15–41)
Albumin: 4.3 g/dL (ref 3.5–5.0)
Alkaline Phosphatase: 113 U/L (ref 38–126)
Anion gap: 23 — ABNORMAL HIGH (ref 5–15)
BUN: 32 mg/dL — ABNORMAL HIGH (ref 8–23)
CO2: 20 mmol/L — ABNORMAL LOW (ref 22–32)
Calcium: 9.6 mg/dL (ref 8.9–10.3)
Chloride: 94 mmol/L — ABNORMAL LOW (ref 98–111)
Creatinine, Ser: 6.14 mg/dL — ABNORMAL HIGH (ref 0.44–1.00)
GFR, Estimated: 7 mL/min — ABNORMAL LOW (ref >60)
Glucose, Bld: 193 mg/dL — ABNORMAL HIGH (ref 70–99)
Potassium: 5.4 mmol/L — ABNORMAL HIGH (ref 3.5–5.1)
Sodium: 137 mmol/L (ref 135–145)
Total Bilirubin: 0.5 mg/dL (ref 0.0–1.2)
Total Protein: 7.4 g/dL (ref 6.5–8.1)

## 2024-09-14 LAB — TROPONIN T, HIGH SENSITIVITY: Troponin T High Sensitivity: 129 ng/L (ref 0–19)

## 2024-09-14 LAB — LIPASE, BLOOD: Lipase: 17 U/L (ref 11–51)

## 2024-09-14 LAB — CBG MONITORING, ED: Glucose-Capillary: 166 mg/dL — ABNORMAL HIGH (ref 70–99)

## 2024-09-14 MED ORDER — LORAZEPAM 1 MG PO TABS
0.5000 mg | ORAL_TABLET | Freq: Once | ORAL | Status: AC
  Administered 2024-09-14: 0.5 mg via SUBLINGUAL
  Filled 2024-09-14: qty 1

## 2024-09-14 MED ORDER — PROCHLORPERAZINE EDISYLATE 10 MG/2ML IJ SOLN: 5.0000 mg | Freq: Three times a day (TID) | INTRAMUSCULAR | Status: DC | PRN

## 2024-09-14 MED ORDER — INSULIN ASPART 100 UNIT/ML IJ SOLN
0.0000 [IU] | INTRAMUSCULAR | Status: DC
  Administered 2024-09-15 (×2): 1 [IU] via SUBCUTANEOUS
  Administered 2024-09-15: 2 [IU] via SUBCUTANEOUS
  Administered 2024-09-15 – 2024-09-17 (×6): 1 [IU] via SUBCUTANEOUS

## 2024-09-14 MED ORDER — FENTANYL CITRATE PF 50 MCG/ML IJ SOSY
50.0000 ug | PREFILLED_SYRINGE | Freq: Once | INTRAMUSCULAR | Status: AC
  Administered 2024-09-14: 50 ug via INTRAVENOUS
  Filled 2024-09-14: qty 1

## 2024-09-14 MED ORDER — ACETAMINOPHEN 325 MG PO TABS
650.0000 mg | ORAL_TABLET | Freq: Four times a day (QID) | ORAL | Status: DC | PRN
  Administered 2024-09-15 – 2024-09-17 (×5): 650 mg via ORAL
  Filled 2024-09-14 (×6): qty 2

## 2024-09-14 MED ORDER — ACETAMINOPHEN 650 MG RE SUPP: 650.0000 mg | Freq: Four times a day (QID) | RECTAL | Status: DC | PRN

## 2024-09-14 MED ORDER — DROPERIDOL 2.5 MG/ML IJ SOLN
2.5000 mg | Freq: Once | INTRAMUSCULAR | Status: AC
  Administered 2024-09-14: 2.5 mg via INTRAVENOUS
  Filled 2024-09-14: qty 2

## 2024-09-14 MED ORDER — NALOXONE HCL 0.4 MG/ML IJ SOLN: 0.4000 mg | INTRAMUSCULAR | Status: DC | PRN

## 2024-09-14 MED ORDER — HEPARIN SODIUM (PORCINE) 5000 UNIT/ML IJ SOLN
5000.0000 [IU] | Freq: Three times a day (TID) | INTRAMUSCULAR | Status: DC
Start: 2024-09-15 — End: 2024-09-18
  Administered 2024-09-15 – 2024-09-18 (×9): 5000 [IU] via SUBCUTANEOUS
  Filled 2024-09-14 (×9): qty 1

## 2024-09-14 MED ORDER — FENTANYL CITRATE PF 50 MCG/ML IJ SOSY
12.5000 ug | PREFILLED_SYRINGE | INTRAMUSCULAR | Status: DC | PRN
  Administered 2024-09-15: 12.5 ug via INTRAVENOUS
  Filled 2024-09-14: qty 1

## 2024-09-14 MED ORDER — ONDANSETRON HCL 4 MG/2ML IJ SOLN
4.0000 mg | Freq: Once | INTRAMUSCULAR | Status: AC
  Administered 2024-09-14: 4 mg via INTRAVENOUS
  Filled 2024-09-14: qty 2

## 2024-09-14 MED ORDER — SODIUM ZIRCONIUM CYCLOSILICATE 10 G PO PACK: 10.0000 g | PACK | Freq: Once | ORAL | Status: DC

## 2024-09-14 MED ORDER — METOCLOPRAMIDE HCL 5 MG/ML IJ SOLN
5.0000 mg | Freq: Two times a day (BID) | INTRAMUSCULAR | Status: DC
  Administered 2024-09-15 (×2): 5 mg via INTRAVENOUS
  Filled 2024-09-14 (×2): qty 2

## 2024-09-14 NOTE — ED Notes (Signed)
 Patient transported to CT

## 2024-09-14 NOTE — ED Notes (Signed)
 Patient continues with nausea and vomiting. Unable to tolerate PO medications at this time. Provider  notified

## 2024-09-14 NOTE — ED Notes (Signed)
 Called Kim at [facility] to confirm transport

## 2024-09-14 NOTE — ED Notes (Signed)
 Called Thomas at [facility] for transport

## 2024-09-14 NOTE — ED Notes (Signed)
 Ultrasoun IV attempt x3 unsuccessful.

## 2024-09-14 NOTE — H&P (Signed)
 History and Physical    Nancy [name] FMW:[id number] DOB: [birth year] DOA: 09/14/2024  PCP: [name], MD  Patient coming from: [facility] ED  Chief Complaint: Abdominal pain, nausea, vomiting  HPI: Nancy [name] is a 63 y.o. female with medical history significant of type 2 diabetes, hypertension, hypothyroidism, hyperlipidemia, CVA, ESRD on HD MWF, anemia of chronic disease, HFpEF, CAD, PVD status post bilateral BKA, chronic constipation and recurrent nausea, gastroparesis, IBS, depression, anxiety.  She was admitted last month 8/24-8/29 for acute lower GI bleed secondary to colitis.  Patient presents today with 3-day history of generalized abdominal pain, nausea, and vomiting.  Patient states she was admitted for colitis last month but is no longer having any bloody bowel movements.  Denies fevers.  She is reporting 8 out of 10 intensity generalized abdominal pain and requesting pain medications.  States she has not been able to tolerate any p.o. intake or take any p.o. meds as she continued to vomit in the emergency room.  Denies recent sick contacts.  She missed dialysis today.  No other complaints.  Denies chest pain or shortness of breath.  ED Course: Mildly tachycardic but afebrile.  Hypertensive with SBP up to 200s.  Per ED physician note, patient initially saturating 89% on room air, subsequently 100% on room air and was placed on 2 L oxygen after initial pain medication.  Labs notable for WBC count 13.1, potassium 5.4 (sample hemolyzed), chloride 94, bicarb 20, glucose 193, normal lipase and LFTs, troponin 129> 88.  EKG without acute ischemic changes.  CT abdomen pelvis and chest x-ray negative for acute findings. Nephrology was consulted by ED physician as patient is due for hemodialysis today.  Patient was given droperidol, fentanyl , Ativan , and Zofran .  Review of Systems:  Review of Systems  All other systems reviewed and are negative.   Past Medical History:   Diagnosis Date   Anemia 2006   Anginal pain    Carotid artery disease    right ICA occlusion, 40-59% LICA 02/2021 US    CHF (congestive heart failure) (HCC)    Coronary artery disease    Depression 2014   previously on amitryptiline    Diabetes mellitus with neurological manifestation (HCC) 2006   Diabetic peripheral neuropathy (HCC) 04/26/2020   Dysphagia    Dyspnea    ESRD on hemodialysis (HCC)    Fracture of left ankle 1997   Gastroparesis 07/2016   Heart murmur    HOH (hard of hearing) 2004   Hyperlipidemia 2006   Hypertension 2006   IBS (irritable bowel syndrome) 2002   Leukopenia 2015   Macular degeneration 11/2019   Peripheral vascular disease    Shingles 2009   Stroke [facility]) 2020   Thyroid  nodule 2004    Past Surgical History:  Procedure Laterality Date   A/V FISTULAGRAM Right 09/25/2022   Procedure: A/V Fistulagram;  Surgeon: Serene Gaile ORN, MD;  Location: [facility] INVASIVE CV LAB;  Service: Cardiovascular;  Laterality: Right;   ABDOMINAL HYSTERECTOMY  2005   AMPUTATION Bilateral 04/04/2022   Procedure: BILATERAL BELOW KNEE AMPUTATION;  Surgeon: Harden Jerona GAILS, MD;  Location: [facility] OR;  Service: Orthopedics;  Laterality: Bilateral;   AV FISTULA PLACEMENT Right 12/22/2021   Procedure: RIGHT ARM ARTERIOVENOUS (AV) BRACIOCPHELAIC FISTULA CREATION;  Surgeon: Sheree Penne Bruckner, MD;  Location: [facility] OR;  Service: Vascular;  Laterality: Right;   AV FISTULA PLACEMENT Right 02/19/2022   Procedure: RIGHT ARTERIOVENOUS FISTULA;  Surgeon: Eliza Bruckner RAMAN, MD;  Location: [facility] OR;  Service: Vascular;  Laterality: Right;   AV FISTULA PLACEMENT Left 12/24/2023   Procedure: LEFT BRACHIOCEPHALIC ARTERIOVENOUS (AV) FISTULA CREATION;  Surgeon: Lanis Fonda BRAVO, MD;  Location: [facility] OR;  Service: Vascular;  Laterality: Left;   BASCILIC VEIN TRANSPOSITION Right 08/07/2022   Procedure: SECOND STAGE RIGHT BASILIC VEIN TRANSPOSITION;  Surgeon: Eliza Lonni RAMAN, MD;  Location: [facility] OR;   Service: Vascular;  Laterality: Right;   CATARACT EXTRACTION Left 11/2019   CESAREAN SECTION  1983   CHOLECYSTECTOMY N/A 03/05/2020   Procedure: LAPAROSCOPIC CHOLECYSTECTOMY WITH INTRAOPERATIVE CHOLANGIOGRAM;  Surgeon: Belinda Cough, MD;  Location: [facility] ORS;  Service: General;  Laterality: N/A;   COLONOSCOPY     [facility] said no polyps were found   ESOPHAGOGASTRODUODENOSCOPY (EGD) WITH PROPOFOL  Left 08/26/2014   Procedure: ESOPHAGOGASTRODUODENOSCOPY (EGD) WITH PROPOFOL ;  Surgeon: Elsie Cree, MD;  Location: [facility] ENDOSCOPY;  Service: Endoscopy;  Laterality: Left;   ESOPHAGOGASTRODUODENOSCOPY (EGD) WITH PROPOFOL  N/A 03/03/2020   Procedure: ESOPHAGOGASTRODUODENOSCOPY (EGD) WITH PROPOFOL ;  Surgeon: Albertus Gordy HERO, MD;  Location: [facility] ENDOSCOPY;  Service: Gastroenterology;  Laterality: N/A;   IR FLUORO GUIDE CV LINE RIGHT  02/15/2022   IR US  GUIDE VASC ACCESS RIGHT  02/15/2022   PERIPHERAL VASCULAR BALLOON ANGIOPLASTY Right 09/25/2022   Procedure: PERIPHERAL VASCULAR BALLOON ANGIOPLASTY;  Surgeon: Serene Gaile ORN, MD;  Location: [facility];  Service: Cardiovascular;  Laterality: Right;  AVF   RIGHT HEART CATH N/A 07/14/2021   Procedure: RIGHT HEART CATH;  Surgeon: Rolan Ezra RAMAN, MD;  Location: [facility] INVASIVE CV LAB;  Service: Cardiovascular;  Laterality: N/A;   RIGHT/LEFT HEART CATH AND CORONARY ANGIOGRAPHY N/A 04/27/2021   Procedure: RIGHT/LEFT HEART CATH AND CORONARY ANGIOGRAPHY;  Surgeon: Court Dorn PARAS, MD;  Location: [facility] INVAS[facility] CV LAB;  Service: Cardiovascular;  Laterality: N/A;     reports that she quit smoking about 49 years ago. Her smoking use included cigarettes. She started smoking about 43 years ago. She has a 22 pack-year smoking history. She has never been exposed to tobacco smoke. She has never used smokeless tobacco. She reports that she does not drink alcohol and does not use drugs.  Allergies  Allergen Reactions   Hydralazine  Other (See Comments)   Hydralazine  Hcl  Other (See Comments)    Hair loss   Hydrocodone Itching and Other (See Comments)    Upset stomach   Metformin  And Related Nausea And Vomiting and Other (See Comments)    Stomach pains, also   Other Nausea Only and Other (See Comments)    Lettuce- Does not digest this!!   Plaquenil [Hydroxychloroquine Sulfate] Hives   Shellfish Allergy Nausea And Vomiting   Shellfish-Derived Products Nausea Only and Other (See Comments)    Caused an upset stomach   Shrimp (Diagnostic) Nausea Only and Other (See Comments)    Upset stomach    Sulfa Antibiotics Hives   Sulfur  Hives    Family History  Problem Relation Age of Onset   Hypertension Mother    Heart disease Mother    Diabetes Mother    Thyroid  disease Mother    Congestive Heart Failure Mother    Breast cancer Maternal Grandmother    Colon cancer Maternal Grandfather    Heart attack Sister    Heart disease Brother    Hyperlipidemia Brother    Hypertension Brother    Diabetes Father    Breast cancer Maternal Aunt     Prior to Admission medications   Medication Sig Start Date End Date Taking? Authorizing Provider  acetaminophen  (TYLENOL ) 500 MG tablet Take 1,000 mg by mouth every 8 (eight) hours as needed for mild pain (pain score 1-3), moderate pain (pain score 4-6) or headache.    [provider]  albuterol  (VENTOLIN  HFA) 108 (90 Base) MCG/ACT inhaler Inhale 2 puffs into the lungs every 6 (six) hours as needed. 08/21/23   [name], MD  ALPRAZolam  (XANAX ) 0.25 MG tablet Take 1 tablet (0.25 mg total) by mouth on Monday, Wednesday and Friday at hemodialysis. 06/10/24   [name], MD  amLODipine  (NORVASC ) 10 MG tablet Take 1 tablet (10 mg total) by mouth daily for hypertension 06/04/24   [name], MD  aspirin  81 MG EC tablet Take 1 tablet (81 mg total) by mouth daily. 09/26/21   Milford, Harlene HERO, FNP  atorvastatin  (LIPITOR) 40 MG tablet Take 1 tablet (40 mg total) by mouth daily for cholesterol. 06/04/24    [name], MD  Continuous Glucose Sensor (FREESTYLE LIBRE 3 PLUS SENSOR) MISC Change sensor every 15 days. 08/27/24   Motwani, Komal, MD  Continuous Glucose Sensor (FREESTYLE LIBRE 3 SENSOR) MISC by Does not apply route.    [provider]  ezetimibe  (ZETIA ) 10 MG tablet Take 1 tablet (10 mg total) by mouth daily for cholesterol. 06/04/24   [name], MD  Glucagon  (BAQSIMI  ONE PACK) 3 MG/DOSE POWD Place 1 Device into the nose as needed (Low blood sugar with impaired consciousness). Patient not taking: Reported on 09/03/2024 08/27/24   Motwani, Komal, MD  insulin  glargine (LANTUS  SOLOSTAR) 100 UNIT/ML Solostar Pen Inject 5 Units into the skin 2 (two) times daily. Increased by Provider on 6/24 06/16/24   [name], MD  isosorbide  mononitrate (IMDUR ) 30 MG 24 hr tablet Take 2 tablets (60 mg total) by mouth daily for blood pressure. 06/04/24   [name], MD  labetalol  (NORMODYNE ) 300 MG tablet Take 1 tablet (300 mg total) by mouth 2 (two) times daily. Patient taking differently: Take 300 mg by mouth daily. Take one tablet by mouth on Tues, Thurs, Saturday, and Sunday in the morning (non-dialysis days). Take one tablet by mouth on Mon, Wed,and Fri in the evening after dialysis. 06/04/24   [name], MD  levothyroxine  (SYNTHROID ) 25 MCG tablet Take 1 tablet (25 mcg total) by mouth daily for low thyroid  hormone. 06/04/24   [name], MD  Menthol, Topical Analgesic, (BIOFREEZE COOL THE PAIN) 4 % GEL Apply 1 Application topically as needed (Shoulder and knee pain.). Patient not taking: Reported on 09/03/2024    [provider]  omeprazole  (PRILOSEC) 20 MG capsule Take 2 capsules (40 mg total) by mouth daily for acid reflux. 07/13/24   [name], MD  ondansetron  (ZOFRAN ) 4 MG tablet Take 1 tablet (4 mg total) by mouth every 8 (eight) hours as needed for nausea. 04/29/24     oxyCODONE -acetaminophen  (PERCOCET) 5-325 MG tablet Take 1 tablet by  mouth every 4 (four) hours as needed for severe pain (pain score 7-10). 08/23/24   Raford Lenis, MD  polyethylene glycol powder (GLYCOLAX /MIRALAX ) 17 GM/SCOOP powder Mix 1 capful with 8 ounces of fluid and take by mouth daily as needed. 03/25/24   [name], MD  prochlorperazine  (COMPAZINE ) 10 MG tablet Take 1 tablet (10 mg total) by mouth every 6 (six) hours as needed for nausea or vomiting. 08/23/24   Raford Lenis, MD  sevelamer  carbonate (RENVELA ) 800 MG tablet Take 3 tablets (2,400 mg total) by mouth 3 (  three) times daily with meals and 1 tablet with snacks. 09/04/23     Insulin  NPH Isophane & Regular (RELION 70/30 [place]) Inject 35 Units into the skin 2 (two) times daily.  08/17/14  [provider]    Physical Exam: Vitals:   09/14/24 1730 09/14/24 1950 09/14/24 1956 09/14/24 2031  BP: (!) 145/69 (!) 222/87  (!) 198/96  Pulse: (!) 101 96  (!) 101  Resp: 17 (!) 31  18  Temp:   98.2 F (36.8 C) 97.6 F (36.4 C)  TempSrc:   Oral Oral  SpO2: 100% 99%  100%    Physical Exam Vitals reviewed.  Constitutional:      General: She is not in acute distress. HENT:     Head: Normocephalic and atraumatic.  Eyes:     Extraocular Movements: Extraocular movements intact.  Cardiovascular:     Rate and Rhythm: Normal rate and regular rhythm.     Heart sounds: Normal heart sounds.  Pulmonary:     Effort: Pulmonary effort is normal. No respiratory distress.     Breath sounds: Normal breath sounds. No wheezing or rales.  Abdominal:     General: Bowel sounds are normal.     Palpations: Abdomen is soft.     Tenderness: There is abdominal tenderness.     Comments: Generalized tenderness to palpation  Musculoskeletal:     Cervical back: Normal range of motion.     Comments: Status post bilateral BKA  Skin:    General: Skin is warm and dry.  Neurological:     General: No focal deficit present.     Mental Status: She is alert and oriented to person, place, and time.     Labs on  Admission: I have personally reviewed following labs and imaging studies  CBC: Recent Labs  Lab 09/14/24 1047  WBC 13.1*  NEUTROABS 11.0*  HGB 12.0  HCT 37.7  MCV 93.1  PLT 229   Basic Metabolic Panel: Recent Labs  Lab 09/14/24 1047  NA 137  K 5.4*  CL 94*  CO2 20*  GLUCOSE 193*  BUN 32*  CREATININE 6.14*  CALCIUM  9.6   GFR: Estimated Creatinine Clearance: 11.1 mL/min (A) (by C-G formula based on SCr of 6.14 mg/dL (H)). Liver Function Tests: Recent Labs  Lab 09/14/24 1047  AST 18  ALT 15  ALKPHOS 113  BILITOT 0.5  PROT 7.4  ALBUMIN  4.3   Recent Labs  Lab 09/14/24 1047  LIPASE 17   No results for input(s): AMMONIA in the last 168 hours. Coagulation Profile: No results for input(s): INR, PROTIME in the last 168 hours. Cardiac Enzymes: No results for input(s): CKTOTAL, CKMB, CKMBINDEX, TROPONINI in the last 168 hours. BNP (last 3 results) No results for input(s): PROBNP in the last 8760 hours. HbA1C: No results for input(s): HGBA1C in the last 72 hours. CBG: Recent Labs  Lab 09/14/24 0926  GLUCAP 166*   Lipid Profile: No results for input(s): CHOL, HDL, LDLCALC, TRIG, CHOLHDL, LDLDIRECT in the last 72 hours. Thyroid  Function Tests: No results for input(s): TSH, T4TOTAL, FREET4, T3FREE, THYROIDAB in the last 72 hours. Anemia Panel: No results for input(s): VITAMINB12, FOLATE, FERRITIN, TIBC, IRON , RETICCTPCT in the last 72 hours. Urine analysis:    Component Value Date/Time   COLORURINE COLORLESS (A) 08/22/2024 2305   APPEARANCEUR CLEAR 08/22/2024 2305   LABSPEC 1.009 08/22/2024 2305   PHURINE 8.0 08/22/2024 2305   GLUCOSEU 100 (A) 08/22/2024 2305   HGBUR SMALL (A) 08/22/2024 2305  BILIRUBINUR NEGATIVE 08/22/2024 2305   BILIRUBINUR negative 12/26/2017 1458   KETONESUR NEGATIVE 08/22/2024 2305   PROTEINUR 100 (A) 08/22/2024 2305   UROBILINOGEN 0.2 12/26/2017 1458   UROBILINOGEN 0.2  09/07/2014 0857   NITRITE NEGATIVE 08/22/2024 2305   LEUKOCYTESUR NEGATIVE 08/22/2024 2305    Radiological Exams on Admission: DG Chest Portable 1 View Result Date: 09/14/2024 CLINICAL DATA:  ESRD on HD, n/v and weakness EXAM: PORTABLE CHEST - 1 VIEW COMPARISON:  08/25/2024 FINDINGS: Left IJ approach hemodialysis catheter, which terminates in the right atrium, unchanged. No focal airspace consolidation, pleural effusion, or pneumothorax. No cardiomegaly.No acute fracture or destructive lesion. IMPRESSION: No acute cardiopulmonary abnormality. Electronically Signed   By: Rogelia Myers M.D.   On: 09/14/2024 13:27   CT ABDOMEN PELVIS WO CONTRAST Result Date: 09/14/2024 CLINICAL DATA:  Abdominal pain EXAM: CT ABDOMEN AND PELVIS WITHOUT CONTRAST TECHNIQUE: Multidetector CT imaging of the abdomen and pelvis was performed following the standard protocol without IV contrast. RADIATION DOSE REDUCTION: This exam was performed according to the departmental dose-optimization program which includes automated exposure control, adjustment of the mA and/or kV according to patient size and/or use of iterative reconstruction technique. COMPARISON:  CT of the abdomen pelvis performed August 23, 2024 FINDINGS: Lower chest: No acute abnormality. Hepatobiliary: Postsurgical changes from cholecystectomy. Pancreas: Unremarkable. No pancreatic ductal dilatation or surrounding inflammatory changes. Spleen: Normal in size without focal abnormality. Adrenals/Urinary Tract: Adrenal glands are unchanged. No hydronephrosis. Urinary bladder is decompressed. Stomach/Bowel: No dilated loops of bowel are appreciated. The appendix is well seen and within normal limits. Moderate volume of well-formed stool material is present in the colon. Vascular/Lymphatic: Mild atherosclerotic changes in the abdominal aorta. No abdominal aortic aneurysm. No significant lymphadenopathy. Reproductive: Status post hysterectomy. No adnexal masses. Other:  Fat containing umbilical hernia. Musculoskeletal: Nothing significant. IMPRESSION: 1. No CT evidence of acute process. No evidence of appendicitis or diverticulitis. Electronically Signed   By: Maude Naegeli M.D.   On: 09/14/2024 13:22    EKG: Independently reviewed.  Sinus rhythm, LAFB, LVH, T wave abnormalities in 1 and aVL, QTc 489.  No significant change compared to previous tracings.  Assessment and Plan  Intractable nausea and vomiting, abdominal pain    ED Course: Mildly tachycardic but afebrile.  Hypertensive with SBP up to 200s.  Per ED physician note, patient initially saturating 89% on room air, subsequently 100% on room air and was placed on 2 L oxygen after initial pain medication.  Labs notable for WBC count 13.1, potassium 5.4 (sample hemolyzed), chloride 94, bicarb 20, glucose 193, normal lipase and LFTs, troponin 129> 88.  EKG without acute ischemic changes.  CT abdomen pelvis and chest x-ray negative for acute findings. Nephrology was consulted by ED physician as patient is due for hemodialysis today.  Patient was given droperidol, fentanyl , Ativan , and Zofran .  ESRD on HD MWF  Chronic HFpEF Last echo done in November 2024 showing EF 55 to 60%.  Type 2 diabetes A1c 8.0 on 08/10/2024.  Placed on very sensitive sliding scale insulin .  Hypertension  Hypothyroidism  Hyperlipidemia  History of CVA  Anemia of chronic disease  CAD  PVD status post bilateral BKA  Anxiety/depression  DVT prophylaxis: {Blank single:19197::Lovenox ,SQ Heparin ,IV heparin  gtt,Xarelto,Eliquis,Coumadin,SCDs,***} Code Status: {Blank single:19197::Full Code,DNR,DNR/DNI,Comfort Care,***} Family Communication: ***  Consults called: ***  Level of care: {Blank single:19197::Med-Surg,Telemetry bed,Progressive Care Unit,Step Down Unit} Admission status: *** Time Spent: 75+ minutes***  Editha Ram MD [facility]  If 7PM-7AM, please contact  night-coverage [email or web address]  09/14/2024, 10:46 PM

## 2024-09-14 NOTE — ED Triage Notes (Signed)
 Pt arrives with abdominal pain, N/V that started a few days ago. Husband did most of the talking for the patient, wasn't interactive.  Hx CKD with dialysis

## 2024-09-14 NOTE — ED Notes (Signed)
 Reports nausea symptom improved. Juice and crackers given for PO challenge

## 2024-09-14 NOTE — ED Provider Notes (Signed)
 [place] EMERGENCY DEPARTMENT AT [facility] Provider Note   CSN: [id number] Arrival date & time: 09/14/24  9086     Patient presents with: Abdominal Pain and Nausea   ELENORA HAWBAKER is a 63 y.o. female.    Abdominal Pain Associated symptoms: nausea and vomiting      63 year old female with medical history significant for morbid obesity, DM2, HTN, GERD, CVA, pancreatitis, diastolic CHF, ESRD on HD MWF, CAD, colitis, history of bilateral BKA's presenting to the emergency department with epigastric abdominal pain, nausea and vomiting.  The patient has had several days of nausea and vomiting.  She was supposed to go to dialysis today, last had a full dialysis session on Friday.  No fevers.  She endorses epigastric discomfort and feels as if she is mildly dehydrated.  Prior to Admission medications   Medication Sig Start Date End Date Taking? Authorizing Provider  acetaminophen  (TYLENOL ) 500 MG tablet Take 1,000 mg by mouth every 8 (eight) hours as needed for mild pain (pain score 1-3), moderate pain (pain score 4-6) or headache.    [provider]  albuterol  (VENTOLIN  HFA) 108 (90 Base) MCG/ACT inhaler Inhale 2 puffs into the lungs every 6 (six) hours as needed. 08/21/23   [name], MD  ALPRAZolam  (XANAX ) 0.25 MG tablet Take 1 tablet (0.25 mg total) by mouth on Monday, Wednesday and Friday at hemodialysis. 06/10/24   [name], MD  amLODipine  (NORVASC ) 10 MG tablet Take 1 tablet (10 mg total) by mouth daily for hypertension 06/04/24   [name], MD  aspirin  81 MG EC tablet Take 1 tablet (81 mg total) by mouth daily. 09/26/21   [place], Harlene HERO, FNP  atorvastatin  (LIPITOR) 40 MG tablet Take 1 tablet (40 mg total) by mouth daily for cholesterol. 06/04/24   [name], MD  Continuous Glucose Sensor (FREESTYLE LIBRE 3 PLUS SENSOR) MISC Change sensor every 15 days. 08/27/24   Motwani, Komal, MD  Continuous Glucose Sensor (FREESTYLE LIBRE  3 SENSOR) MISC by Does not apply route.    [provider]  ezetimibe  (ZETIA ) 10 MG tablet Take 1 tablet (10 mg total) by mouth daily for cholesterol. 06/04/24   [name], MD  Glucagon  (BAQSIMI  ONE PACK) 3 MG/DOSE POWD Place 1 Device into the nose as needed (Low blood sugar with impaired consciousness). Patient not taking: Reported on 09/03/2024 08/27/24   Motwani, Komal, MD  insulin  glargine (LANTUS  SOLOSTAR) 100 UNIT/ML Solostar Pen Inject 5 Units into the skin 2 (two) times daily. Increased by Provider on 6/24 06/16/24   [name], MD  isosorbide  mononitrate (IMDUR ) 30 MG 24 hr tablet Take 2 tablets (60 mg total) by mouth daily for blood pressure. 06/04/24   [name], MD  labetalol  (NORMODYNE ) 300 MG tablet Take 1 tablet (300 mg total) by mouth 2 (two) times daily. Patient taking differently: Take 300 mg by mouth daily. Take one tablet by mouth on Tues, Thurs, Saturday, and Sunday in the morning (non-dialysis days). Take one tablet by mouth on Mon, Wed,and Fri in the evening after dialysis. 06/04/24   [name], MD  levothyroxine  (SYNTHROID ) 25 MCG tablet Take 1 tablet (25 mcg total) by mouth daily for low thyroid  hormone. 06/04/24   [name], MD  Menthol, Topical Analgesic, (BIOFREEZE COOL THE PAIN) 4 % GEL Apply 1 Application topically as needed (Shoulder and knee pain.). Patient not taking: Reported on 09/03/2024    [provider]  omeprazole  (  PRILOSEC) 20 MG capsule Take 2 capsules (40 mg total) by mouth daily for acid reflux. 07/13/24   [name], MD  ondansetron  (ZOFRAN ) 4 MG tablet Take 1 tablet (4 mg total) by mouth every 8 (eight) hours as needed for nausea. 04/29/24     oxyCODONE -acetaminophen  (PERCOCET) 5-325 MG tablet Take 1 tablet by mouth every 4 (four) hours as needed for severe pain (pain score 7-10). 08/23/24   Raford Lenis, MD  polyethylene glycol powder (GLYCOLAX /MIRALAX ) 17 GM/SCOOP powder Mix 1 capful with 8 ounces  of fluid and take by mouth daily as needed. 03/25/24   [name], MD  prochlorperazine  (COMPAZINE ) 10 MG tablet Take 1 tablet (10 mg total) by mouth every 6 (six) hours as needed for nausea or vomiting. 08/23/24   Raford Lenis, MD  sevelamer  carbonate (RENVELA ) 800 MG tablet Take 3 tablets (2,400 mg total) by mouth 3 (three) times daily with meals and 1 tablet with snacks. 09/04/23     Insulin  NPH Isophane & Regular (RELION 70/30 [place]) Inject 35 Units into the skin 2 (two) times daily.  08/17/14  [provider]    Allergies: Hydralazine , Hydralazine  hcl, Hydrocodone, Metformin  and related, Other, Plaquenil [hydroxychloroquine sulfate], Shellfish allergy, Shellfish-derived products, Shrimp (diagnostic), Sulfa antibiotics, and Sulfur     Review of Systems  Gastrointestinal:  Positive for abdominal pain, nausea and vomiting.  All other systems reviewed and are negative.   Updated Vital Signs BP (!) 192/110   Pulse 97   Temp 98.3 F (36.8 C) (Oral)   Resp 20   SpO2 100%   Physical Exam Vitals and nursing note reviewed.  Constitutional:      General: She is not in acute distress. HENT:     Head: Normocephalic and atraumatic.  Eyes:     Conjunctiva/sclera: Conjunctivae normal.     Pupils: Pupils are equal, round, and reactive to light.  Cardiovascular:     Rate and Rhythm: Normal rate and regular rhythm.  Pulmonary:     Effort: Pulmonary effort is normal. No respiratory distress.  Abdominal:     General: There is no distension.     Tenderness: There is abdominal tenderness in the epigastric area. There is no guarding.  Musculoskeletal:        General: No deformity or signs of injury.     Cervical back: Neck supple.  Skin:    Findings: No lesion or rash.  Neurological:     General: No focal deficit present.     Mental Status: She is alert. Mental status is at baseline.     (all labs ordered are listed, but only abnormal results are displayed) Labs Reviewed  CBC  WITH DIFFERENTIAL/PLATELET - Abnormal; Notable for the following components:      Result Value   WBC 13.1 (*)    RDW 15.6 (*)    Neutro Abs 11.0 (*)    All other components within normal limits  COMPREHENSIVE METABOLIC PANEL WITH GFR - Abnormal; Notable for the following components:   Potassium 5.4 (*)    Chloride 94 (*)    CO2 20 (*)    Glucose, Bld 193 (*)    BUN 32 (*)    Creatinine, Ser 6.14 (*)    GFR, Estimated 7 (*)    Anion gap 23 (*)    All other components within normal limits  CBG MONITORING, ED - Abnormal; Notable for the following components:   Glucose-Capillary 166 (*)    All other components within normal  limits  TROPONIN T, HIGH SENSITIVITY - Abnormal; Notable for the following components:   Troponin T High Sensitivity 129 (*)    All other components within normal limits  TROPONIN T, HIGH SENSITIVITY - Abnormal; Notable for the following components:   Troponin T High Sensitivity 88 (*)    All other components within normal limits  LIPASE, BLOOD    EKG: EKG Interpretation Date/Time:  Monday September 14 2024 12:06:53 EDT Ventricular Rate:  94 PR Interval:  164 QRS Duration:  85 QT Interval:  386 QTC Calculation: 483 R Axis:   -27  Text Interpretation: Sinus rhythm Left ventricular hypertrophy Nonspecific T abnormalities, lateral leads Confirmed by Jerrol Agent ([id number]) on 09/14/2024 12:17:18 PM  Radiology: DG Chest Portable 1 View Result Date: 09/14/2024 CLINICAL DATA:  ESRD on HD, n/v and weakness EXAM: PORTABLE CHEST - 1 VIEW COMPARISON:  08/25/2024 FINDINGS: Left IJ approach hemodialysis catheter, which terminates in the right atrium, unchanged. No focal airspace consolidation, pleural effusion, or pneumothorax. No cardiomegaly.No acute fracture or destructive lesion. IMPRESSION: No acute cardiopulmonary abnormality. Electronically Signed   By: Rogelia Myers M.D.   On: 09/14/2024 13:27   CT ABDOMEN PELVIS WO CONTRAST Result Date: 09/14/2024 CLINICAL  DATA:  Abdominal pain EXAM: CT ABDOMEN AND PELVIS WITHOUT CONTRAST TECHNIQUE: Multidetector CT imaging of the abdomen and pelvis was performed following the standard protocol without IV contrast. RADIATION DOSE REDUCTION: This exam was performed according to the departmental dose-optimization program which includes automated exposure control, adjustment of the mA and/or kV according to patient size and/or use of iterative reconstruction technique. COMPARISON:  CT of the abdomen pelvis performed August 23, 2024 FINDINGS: Lower chest: No acute abnormality. Hepatobiliary: Postsurgical changes from cholecystectomy. Pancreas: Unremarkable. No pancreatic ductal dilatation or surrounding inflammatory changes. Spleen: Normal in size without focal abnormality. Adrenals/Urinary Tract: Adrenal glands are unchanged. No hydronephrosis. Urinary bladder is decompressed. Stomach/Bowel: No dilated loops of bowel are appreciated. The appendix is well seen and within normal limits. Moderate volume of well-formed stool material is present in the colon. Vascular/Lymphatic: Mild atherosclerotic changes in the abdominal aorta. No abdominal aortic aneurysm. No significant lymphadenopathy. Reproductive: Status post hysterectomy. No adnexal masses. Other: Fat containing umbilical hernia. Musculoskeletal: Nothing significant. IMPRESSION: 1. No CT evidence of acute process. No evidence of appendicitis or diverticulitis. Electronically Signed   By: Maude Naegeli M.D.   On: 09/14/2024 13:22     Procedures   Medications Ordered in the ED  fentaNYL  (SUBLIMAZE ) injection 50 mcg (50 mcg Intravenous Given 09/14/24 1058)  LORazepam  (ATIVAN ) tablet 0.5 mg (0.5 mg Sublingual Given 09/14/24 1055)  ondansetron  (ZOFRAN ) injection 4 mg (4 mg Intravenous Given 09/14/24 1133)  droperidol (INAPSINE) 2.5 MG/ML injection 2.5 mg (2.5 mg Intravenous Given 09/14/24 1321)                                    Medical Decision Making Amount and/or  Complexity of Data Reviewed Labs: ordered. Radiology: ordered.  Risk Prescription drug management. Decision regarding hospitalization.    63 year old female with medical history significant for morbid obesity, DM2, HTN, GERD, CVA, pancreatitis, diastolic CHF, ESRD on HD MWF, CAD, colitis, history of bilateral BKA's presenting to the emergency department with epigastric abdominal pain, nausea and vomiting.  The patient has had several days of nausea and vomiting.  She was supposed to go to dialysis today, last had a full dialysis session on Friday.  No fevers.  She endorses epigastric discomfort and feels as if she is mildly dehydrated.  On arrival, the patient was afebrile, not tachycardic, heart rate 85, respiratory rate 15, BP 150/122, initially saturating 89% on room air, subsequently 100% on room air, then placed on 2 L O2 after initial pain medication.  Patient presenting with epigastric pain, uncontrolled nausea and vomiting.  The patient does have a history of gastroparesis.  Differential diagnose includes gastroparesis, viral syndrome, foodborne illness, less likely ACS, less likely bowel obstruction, less likely aortic dissection, consider pancreatitis.  IV access was obtained by ultrasound guidance and the patient was administered IV fentanyl , IV Zofran  initially.  She was also administered sublingual Ativan  due to initial delay in obtaining IV access due to difficulty with multiple manual sticks.  Initial EKG was performed which revealed sinus rhythm, LVH present and nonspecific T wave changes, no significant prolonged QTc with a QTc of 483.  A chest x-ray revealed no acute cardiac or pulmonary abnormality, no evidence of pulmonary edema.  No pneumothorax or pneumonia.  Labs: CBC with a leukocytosis to 13.1, hemoglobin 12, CMP with mild hyperkalemia to 5.4, mild hyperglycemia 193, mild anion gap acidosis with a bicarbonate of 20, anion gap of 23, lipase 17, initial cardiac enzyme 129,  repeat cardiac enzyme 88.  CT abdomen pelvis: FINDINGS:  Lower chest: No acute abnormality.    Hepatobiliary: Postsurgical changes from cholecystectomy.    Pancreas: Unremarkable. No pancreatic ductal dilatation or  surrounding inflammatory changes.    Spleen: Normal in size without focal abnormality.    Adrenals/Urinary Tract: Adrenal glands are unchanged. No  hydronephrosis. Urinary bladder is decompressed.    Stomach/Bowel: No dilated loops of bowel are appreciated. The  appendix is well seen and within normal limits. Moderate volume of  well-formed stool material is present in the colon.    Vascular/Lymphatic: Mild atherosclerotic changes in the abdominal  aorta. No abdominal aortic aneurysm. No significant lymphadenopathy.    Reproductive: Status post hysterectomy. No adnexal masses.    Other: Fat containing umbilical hernia.    Musculoskeletal: Nothing significant.    IMPRESSION:  1. No CT evidence of acute process. No evidence of appendicitis or  diverticulitis.    Patient was administered IV droperidol for persistent nausea and vomiting.  Given her history of diabetes and gastroparesis, suspect gastroparesis flare giving overall reassuring workup.  While feeling somewhat improved, the patient could not pass a p.o. challenge, had persistent vomiting after multiple antiemetics and unable to tolerate liquid intake.  Due to this, hospitalist medicine was consulted for admission, Dr. Fairy accepting.  Nephrology was also consulted as the patient is an ESRD patient and was due for dialysis today, will plan for nonemergent hemodialysis inpatient.  Patient and family updated regarding the plan of care and were in agreement.     Final diagnoses:  ESRD (end stage renal disease) on dialysis (HCC)  Nausea and vomiting, unspecified vomiting type  Epigastric pain  Gastroparesis due to DM [facility])    ED Discharge Orders     None          Jerrol Agent, MD 09/14/24  1456

## 2024-09-15 ENCOUNTER — Other Ambulatory Visit: Payer: Self-pay

## 2024-09-15 DIAGNOSIS — R112 Nausea with vomiting, unspecified: Secondary | ICD-10-CM | POA: Diagnosis not present

## 2024-09-15 LAB — GLUCOSE, CAPILLARY
Glucose-Capillary: 123 mg/dL — ABNORMAL HIGH (ref 70–99)
Glucose-Capillary: 134 mg/dL — ABNORMAL HIGH (ref 70–99)
Glucose-Capillary: 162 mg/dL — ABNORMAL HIGH (ref 70–99)
Glucose-Capillary: 168 mg/dL — ABNORMAL HIGH (ref 70–99)
Glucose-Capillary: 184 mg/dL — ABNORMAL HIGH (ref 70–99)
Glucose-Capillary: 230 mg/dL — ABNORMAL HIGH (ref 70–99)

## 2024-09-15 LAB — BASIC METABOLIC PANEL WITH GFR
Anion gap: 17 — ABNORMAL HIGH (ref 5–15)
BUN: 35 mg/dL — ABNORMAL HIGH (ref 8–23)
CO2: 21 mmol/L — ABNORMAL LOW (ref 22–32)
Calcium: 8.7 mg/dL — ABNORMAL LOW (ref 8.9–10.3)
Chloride: 98 mmol/L (ref 98–111)
Creatinine, Ser: 6.66 mg/dL — ABNORMAL HIGH (ref 0.44–1.00)
GFR, Estimated: 6 mL/min — ABNORMAL LOW (ref >60)
Glucose, Bld: 182 mg/dL — ABNORMAL HIGH (ref 70–99)
Potassium: 4.6 mmol/L (ref 3.5–5.1)
Sodium: 136 mmol/L (ref 135–145)

## 2024-09-15 LAB — CBC
HCT: 32.7 % — ABNORMAL LOW (ref 36.0–46.0)
Hemoglobin: 10.5 g/dL — ABNORMAL LOW (ref 12.0–15.0)
MCH: 28.9 pg (ref 26.0–34.0)
MCHC: 32.1 g/dL (ref 30.0–36.0)
MCV: 90.1 fL (ref 80.0–100.0)
Platelets: 264 K/uL (ref 150–400)
RBC: 3.63 MIL/uL — ABNORMAL LOW (ref 3.87–5.11)
RDW: 15.6 % — ABNORMAL HIGH (ref 11.5–15.5)
WBC: 15.9 K/uL — ABNORMAL HIGH (ref 4.0–10.5)
nRBC: 0 % (ref 0.0–0.2)

## 2024-09-15 LAB — RESP PANEL BY RT-PCR (RSV, FLU A&B, COVID)  RVPGX2
Influenza A by PCR: NEGATIVE
Influenza B by PCR: NEGATIVE
Resp Syncytial Virus by PCR: NEGATIVE
SARS Coronavirus 2 by RT PCR: NEGATIVE

## 2024-09-15 LAB — BETA-HYDROXYBUTYRIC ACID: Beta-Hydroxybutyric Acid: 0.25 mmol/L (ref 0.05–0.27)

## 2024-09-15 LAB — HEPATITIS B SURFACE ANTIGEN: Hepatitis B Surface Ag: NONREACTIVE

## 2024-09-15 LAB — TROPONIN T, HIGH SENSITIVITY: Troponin T High Sensitivity: 88 ng/L — ABNORMAL HIGH (ref 0–19)

## 2024-09-15 MED ORDER — NEPRO/CARBSTEADY PO LIQD: 237.0000 mL | ORAL | Status: DC | PRN

## 2024-09-15 MED ORDER — HEPARIN SODIUM (PORCINE) 1000 UNIT/ML DIALYSIS
5000.0000 [IU] | Freq: Once | INTRAMUSCULAR | Status: AC
  Administered 2024-09-15: 5000 [IU] via INTRAVENOUS_CENTRAL
  Filled 2024-09-15: qty 5

## 2024-09-15 MED ORDER — LABETALOL HCL 200 MG PO TABS
300.0000 mg | ORAL_TABLET | ORAL | Status: DC
  Administered 2024-09-16: 300 mg via ORAL
  Filled 2024-09-15: qty 2

## 2024-09-15 MED ORDER — HEPARIN SODIUM (PORCINE) 1000 UNIT/ML IJ SOLN
INTRAMUSCULAR | Status: AC
  Filled 2024-09-15: qty 4

## 2024-09-15 MED ORDER — AMLODIPINE BESYLATE 10 MG PO TABS
10.0000 mg | ORAL_TABLET | Freq: Every day | ORAL | Status: DC
  Administered 2024-09-15 – 2024-09-18 (×4): 10 mg via ORAL
  Filled 2024-09-15 (×4): qty 1

## 2024-09-15 MED ORDER — LIDOCAINE HCL (PF) 1 % IJ SOLN: 5.0000 mL | INTRAMUSCULAR | Status: DC | PRN

## 2024-09-15 MED ORDER — HEPARIN SODIUM (PORCINE) 1000 UNIT/ML DIALYSIS: 1000.0000 [IU] | INTRAMUSCULAR | Status: DC | PRN

## 2024-09-15 MED ORDER — LABETALOL HCL 5 MG/ML IV SOLN
5.0000 mg | INTRAVENOUS | Status: DC | PRN
  Administered 2024-09-15 – 2024-09-16 (×5): 5 mg via INTRAVENOUS
  Filled 2024-09-15 (×5): qty 4

## 2024-09-15 MED ORDER — HEPARIN SODIUM (PORCINE) 1000 UNIT/ML IJ SOLN
INTRAMUSCULAR | Status: AC
  Filled 2024-09-15: qty 7

## 2024-09-15 MED ORDER — CHLORHEXIDINE GLUCONATE CLOTH 2 % EX PADS
6.0000 | MEDICATED_PAD | Freq: Every day | CUTANEOUS | Status: DC
  Administered 2024-09-15 – 2024-09-18 (×4): 6 via TOPICAL

## 2024-09-15 MED ORDER — HEPARIN SODIUM (PORCINE) 1000 UNIT/ML DIALYSIS
4000.0000 [IU] | Freq: Once | INTRAMUSCULAR | Status: DC
Start: 2024-09-15 — End: 2024-09-15
  Filled 2024-09-15: qty 4

## 2024-09-15 MED ORDER — ALTEPLASE 2 MG IJ SOLR: 2.0000 mg | Freq: Once | INTRAMUSCULAR | Status: DC | PRN

## 2024-09-15 MED ORDER — SEVELAMER CARBONATE 800 MG PO TABS
2400.0000 mg | ORAL_TABLET | Freq: Three times a day (TID) | ORAL | Status: DC
  Administered 2024-09-15 – 2024-09-18 (×8): 2400 mg via ORAL
  Filled 2024-09-15 (×8): qty 3

## 2024-09-15 MED ORDER — HEPARIN SODIUM (PORCINE) 1000 UNIT/ML DIALYSIS: 2500.0000 [IU] | INTRAMUSCULAR | Status: DC | PRN

## 2024-09-15 MED ORDER — SORBITOL 70 % SOLN
30.0000 mL | Freq: Two times a day (BID) | Status: DC
  Administered 2024-09-15 – 2024-09-18 (×7): 30 mL via ORAL
  Filled 2024-09-15 (×8): qty 30

## 2024-09-15 MED ORDER — PENTAFLUOROPROP-TETRAFLUOROETH EX AERO: 1.0000 | INHALATION_SPRAY | CUTANEOUS | Status: DC | PRN

## 2024-09-15 MED ORDER — LABETALOL HCL 200 MG PO TABS
300.0000 mg | ORAL_TABLET | ORAL | Status: DC
  Administered 2024-09-17: 300 mg via ORAL
  Filled 2024-09-15: qty 2

## 2024-09-15 MED ORDER — METOCLOPRAMIDE HCL 5 MG/ML IJ SOLN
5.0000 mg | Freq: Three times a day (TID) | INTRAMUSCULAR | Status: DC
  Administered 2024-09-15 – 2024-09-17 (×5): 5 mg via INTRAVENOUS
  Filled 2024-09-15 (×6): qty 2
  Filled 2024-09-15: qty 1

## 2024-09-15 MED ORDER — ANTICOAGULANT SODIUM CITRATE 4% (200MG/5ML) IV SOLN: 5.0000 mL | Status: DC | PRN

## 2024-09-15 MED ORDER — LIDOCAINE-PRILOCAINE 2.5-2.5 % EX CREA: 1.0000 | TOPICAL_CREAM | CUTANEOUS | Status: DC | PRN

## 2024-09-15 MED ORDER — TRIMETHOBENZAMIDE HCL 100 MG/ML IM SOLN
200.0000 mg | Freq: Three times a day (TID) | INTRAMUSCULAR | Status: DC | PRN
  Administered 2024-09-15 – 2024-09-17 (×7): 200 mg via INTRAMUSCULAR
  Filled 2024-09-15 (×10): qty 2

## 2024-09-15 NOTE — Plan of Care (Signed)
  Problem: Pain Managment: Goal: General experience of comfort will improve and/or be controlled Outcome: Not Progressing   Problem: Fluid Volume: Goal: Ability to maintain a balanced intake and output will improve Outcome: Not Progressing   Problem: Nutritional: Goal: Maintenance of adequate nutrition will improve Outcome: Not Progressing   Problem: Education: Goal: Knowledge of General Education information will improve Description: Including pain rating scale, medication(s)/side effects and non-pharmacologic comfort measures Outcome: Progressing   Problem: Health Behavior/Discharge Planning: Goal: Ability to manage health-related needs will improve Outcome: Progressing   Problem: Clinical Measurements: Goal: Ability to maintain clinical measurements within normal limits will improve Outcome: Progressing Goal: Will remain free from infection Outcome: Progressing Goal: Diagnostic test results will improve Outcome: Progressing Goal: Respiratory complications will improve Outcome: Progressing Goal: Cardiovascular complication will be avoided Outcome: Progressing   Problem: Activity: Goal: Risk for activity intolerance will decrease Outcome: Progressing   Problem: Nutrition: Goal: Adequate nutrition will be maintained Outcome: Progressing   Problem: Coping: Goal: Level of anxiety will decrease Outcome: Progressing   Problem: Elimination: Goal: Will not experience complications related to bowel motility Outcome: Progressing Goal: Will not experience complications related to urinary retention Outcome: Progressing   Problem: Safety: Goal: Ability to remain free from injury will improve Outcome: Progressing   Problem: Skin Integrity: Goal: Risk for impaired skin integrity will decrease Outcome: Progressing   Problem: Education: Goal: Ability to describe self-care measures that may prevent or decrease complications (Diabetes Survival Skills Education) will  improve Outcome: Progressing Goal: Individualized Educational Video(s) Outcome: Progressing   Problem: Coping: Goal: Ability to adjust to condition or change in health will improve Outcome: Progressing   Problem: Health Behavior/Discharge Planning: Goal: Ability to identify and utilize available resources and services will improve Outcome: Progressing Goal: Ability to manage health-related needs will improve Outcome: Progressing   Problem: Metabolic: Goal: Ability to maintain appropriate glucose levels will improve Outcome: Progressing   Problem: Nutritional: Goal: Progress toward achieving an optimal weight will improve Outcome: Progressing   Problem: Skin Integrity: Goal: Risk for impaired skin integrity will decrease Outcome: Progressing   Problem: Tissue Perfusion: Goal: Adequacy of tissue perfusion will improve Outcome: Progressing

## 2024-09-15 NOTE — Progress Notes (Signed)
 Pt receives out-pt HD at [facility], MWF, 1150 chair time. Will continue to assist as needed.   Lavanda Noreta Kue Dialysis Navigator [phone]

## 2024-09-15 NOTE — Progress Notes (Signed)
   09/15/24 1730  Vitals  Temp 97.8 F (36.6 C)  Temp Source Oral  BP (!) 175/72  BP Location Left Arm  BP Method Automatic  Patient Position (if appropriate) Lying  Pulse Rate 87  Pulse Rate Source Monitor  Weight 105.6 kg  Type of Weight Post-Dialysis  Oxygen Therapy  SpO2 100 %  O2 Device Nasal Cannula  O2 Flow Rate (L/min) 2 L/min  During Treatment Monitoring  Blood Flow Rate (mL/min) 0 mL/min  Arterial Pressure (mmHg) -3.03 mmHg  Venous Pressure (mmHg) -2.42 mmHg  TMP (mmHg) -50.5 mmHg  Ultrafiltration Rate (mL/min) 1885 mL/min  Dialysate Flow Rate (mL/min) 299 ml/min  Duration of HD Treatment -hour(s) 3.05 hour(s)  Cumulative Fluid Removed (mL) per Treatment  2615.16  Post Treatment  Dialyzer Clearance Lightly streaked  Hemodialysis Intake (mL) 0 mL  Liters Processed 73.1  Fluid Removed (mL) 2600 mL  Tolerated HD Treatment Yes  Post-Hemodialysis Comments tx ended early vss just states that she is feeling hot and wanted to stop tx  Hemodialysis Catheter Left Subclavian Double lumen Permanent (Tunneled)  No placement date or time found.   Placed prior to admission: Yes  Orientation: Left  Access Location: Subclavian  Hemodialysis Catheter Type: Double lumen Permanent (Tunneled)  Site Condition No complications  Blue Lumen Status Flushed;Heparin  locked  Red Lumen Status Flushed;Heparin  locked  Purple Lumen Status N/A  Catheter fill solution Heparin  1000 units/ml  Catheter fill volume (Arterial) 1.6 cc  Catheter fill volume (Venous) 1.6  Dressing Type Transparent  Dressing Status Clean, Dry, Intact  Drainage Description None  Post treatment catheter status Capped and Clamped   Received patient in bed to unit.  Alert and oriented.  Informed consent signed and in chart.   TX duration:3:03  Patient tolerated well.  Transported back to the room  Alert, without acute distress.  Hand-off given to patient's nurse.   Access used: L  HD catheter Access issues:  none  Total UF removed: 2600 Medication(s) given: none Post HD VS: see above Post HD weight: 105.6kg   Docia CHRISTELLA [facility]

## 2024-09-15 NOTE — Progress Notes (Signed)
 [facility]   ROUNDING   NOTE RONNESHA MESTER FMW:[id number]  DOB: 22-Aug-1961  DOA: 09/14/2024  PCP: [name], MD  09/15/2024,7:09 AM  LOS: 1 day    Code Status: DNR     from: Home   63 year old black female Morbid obesity BMI >35 DM TY 2 diagnosed 2019 followed by Dr. Dartha endocrine uses freestyle libre ESRD started HD 02/2022-MWF anemia renal disease CVA, CAD with cath 04/27/2021 nonobstructive CAD pulmonary hypertension PVD with occluded R right ICA moderate left ICA stenosis Previous tobacco PVD with subsequent bilateral amputation 04/04/2022 Thyroid  disease?  Nodules versus Graves' Right parotid adenoma diagnosed recently to follow-up with Dr. Luciano  Recent hospitalization 824-08/14/2024  for lower GI bleed secondary to probable colitis-seen by [facility] gastroenterology during hospitalization and was scheduled for outpatient follow-up   9/29 Re-presented to hospital with nausea vomiting?  And missed a session of dialysis Gastroparesis in the setting of recent treatment of colitis pain apparently 8/10 Kept n.p.o.  Labs on admit Sodium 137 potassium 5.4 chloride 94 BUN/creatinine 32/6.1 At bedtime troponin 129 cycling to 88 WBC 13 platelet 229 hemoglobin 12 respiratory viral panel negative WBC 13.1 hemoglobin 12 platelet 229  Imaging CT ABD pelvis without contrast no CT evidence of acute process, moderate amount of well-formed stool in colon status post hysterectomy CXR 1 view negative EKG my over read   Assessment  & Plan :    Gastroparesis in the setting of diabetes mellitus?  IBD history Last endoscopy 03/03/2020 showed normal esophagus Dr. Albertus Increase Reglan  if needed to 3 times daily 5 mg IV however keep twice daily for now until we see how she does with a soft diet and graduate to diabetic diet if able-received Tigan 200 every 8 as needed for intractable nausea vomiting Seem more consistent with diabetic gastroparesis-versus large amount of stool on CT  Will give  some sorbitol  30 mL twice daily to see if this helps--was told by PCP to take laxatives which she has been taking but has not passed a stool since 9/27 was able to pass p.o. liquid trial but we will graduate to soft-if no better may need to ask GI and opinion however typically do not do gastric emptying study in the hospital and this may just take some time/IV Reglan  to help with same Given nausea ongoing X 6 months may just need outpatient reevaluation when she stabilizes although this is her second admission for GI related issues Start oral Zofran  if able Leukocytosis NOS Recent treatment for colitis-no fever no chills and no concerns for sepsis physiology at this time although hypotensive She was recently treated for infection Hold further workup at this time-given symptoms ESRD MWF + anemia renal disease Last documented weight 247--- seems about the same weight right now so does not seem to need emergent HD Will ask renal to see for nonemergent HD needs Continue Renvela  2.4 g 3 times daily as able-obtain phosphorus when able Iron  stores etc. as per renal Nonobstructive CAD PVD status post bilateral BKA's as above Holding aspirin  81 atorvastatin  40 Zetia  10 Currently seems to be on amlodipine  10 as well as Imdur -both can be implicated to some degree in gastric emptying issues although less common Given previous nonobstructive CAD may need to change meds? Thyroid  disease?  Graves' For now hold levothyroxine  25-in several days can resume and/or give IV Moderately controlled diabetes mellitus with nephropathy as above CBGs ranging 160-180 Continue sliding scale every 4 alone for now Holding Lantus  5 twice daily  at this time Elevated troponin without damage pattern Etiology unclear, likely related to GI issues-no current chest pain   Data Reviewed:   WBC 15.9 hemoglobin 10.5 platelet 264 Sodium 136 potassium 4.6 bicarb 21 BUN/creatinine 35/6.6 GFR 6  DVT prophylaxis: Heparin   Status  is: Inpatient Remains inpatient appropriate because:   Requires improvement     Current Dispo: Inpatient likely home with husband subsequently     Subjective:   Nursing reports complaining of nausea this morning-not time yet for Tigan-she is able to sip water  right now without vomiting Tells me this has been going on on and off for about 6 months usually goes to the bathroom twice a day Last colonoscopy may be >10 years ago     Objective + exam Vitals:   09/14/24 2031 09/15/24 0116 09/15/24 0325 09/15/24 0422  BP: (!) 198/96 (!) 199/97 (!) 196/104 (!) 194/94  Pulse: (!) 101 94 96 88  Resp: 18 20  18   Temp: 97.6 F (36.4 C) 97.8 F (36.6 C)  97.9 F (36.6 C)  TempSrc: Oral Oral    SpO2: 100% 100%  98%   There were no vitals filed for this visit.   Examination: Awake coherent no icterus no pallor slightly droopy left eye Thick neck Tongue protrudes midline Mallampati 4 S1-S2 no murmur No epigastric or central abdominal tenderness slightly tender in left lower quadrant but objective signs of proportion to subjective symptoms Bilateral BKA noted Chest is clear Psych euthymic patient coherent pleasant     Scheduled Meds:  heparin   5,000 Units Subcutaneous Q8H   insulin  aspart  0-6 Units Subcutaneous Q4H   metoCLOPramide  (REGLAN ) injection  5 mg Intravenous Q12H   Continuous Infusions:  Time 60  Jai-Gurmukh Ibrohim Simmers, MD  [facility]

## 2024-09-15 NOTE — Consult Note (Signed)
 Renal Service Consult Note [facility] Nancy [name] Nancy Fret, MD  Patient: Nancy [name] Date: 09/15/2024 Requesting Physician: Dr. Alfornia  Reason for Consult: ESRD pt w/ abdominal pain HPI: The patient is a 63 y.o. year-old w/ PMH as below who presented to ED yesterday morning c/o abd pain, N/V that started a few days prior. Pt has esrd on HD MWF, last HD was Friday last week.    Pt seen in room. Abd pain persisting. No SOB, no LE edema. No HD transportation issues, her family takes her to HD.     ROS - denies CP, no joint pain, no HA, no blurry vision, no rash, no diarrhea   Past Medical History  Past Medical History:  Diagnosis Date   Anemia 2006   Anginal pain    Carotid artery disease    right ICA occlusion, 40-59% LICA 02/2021 US    CHF (congestive heart failure) (HCC)    Coronary artery disease    Depression 2014   previously on amitryptiline    Diabetes mellitus with neurological manifestation (HCC) 2006   Diabetic peripheral neuropathy (HCC) 04/26/2020   Dysphagia    Dyspnea    ESRD on hemodialysis (HCC)    Fracture of left ankle 1997   Gastroparesis 07/2016   Heart murmur    HOH (hard of hearing) 2004   Hyperlipidemia 2006   Hypertension 2006   IBS (irritable bowel syndrome) 2002   Leukopenia 2015   Macular degeneration 11/2019   Peripheral vascular disease    Shingles 2009   Stroke [facility]) 2020   Thyroid  nodule 2004   Past Surgical History  Past Surgical History:  Procedure Laterality Date   A/V FISTULAGRAM Right 09/25/2022   Procedure: A/V Fistulagram;  Surgeon: Serene Gaile ORN, MD;  [facility]: [facility] INVASIVE CV LAB;  Service: Cardiovascular;  Laterality: Right;   ABDOMINAL HYSTERECTOMY  2005   AMPUTATION Bilateral 04/04/2022   Procedure: BILATERAL BELOW KNEE AMPUTATION;  Surgeon: Harden Jerona GAILS, MD;  Location: [facility] OR;  Service: Orthopedics;  Laterality: Bilateral;   AV FISTULA PLACEMENT Right 12/22/2021   Procedure: RIGHT ARM  ARTERIOVENOUS (AV) BRACIOCPHELAIC FISTULA CREATION;  Surgeon: Sheree Penne Bruckner, MD;  Location: [facility] OR;  Service: Vascular;  Laterality: Right;   AV FISTULA PLACEMENT Right 02/19/2022   Procedure: RIGHT ARTERIOVENOUS FISTULA;  Surgeon: Eliza Bruckner RAMAN, MD;  Location: [facility] OR;  Service: Vascular;  Laterality: Right;   AV FISTULA PLACEMENT Left 12/24/2023   Procedure: LEFT BRACHIOCEPHALIC ARTERIOVENOUS (AV) FISTULA CREATION;  Surgeon: Lanis Fonda BRAVO, MD;  Location: [facility] OR;  Service: Vascular;  Laterality: Left;   BASCILIC VEIN TRANSPOSITION Right 08/07/2022   Procedure: SECOND STAGE RIGHT BASILIC VEIN TRANSPOSITION;  Surgeon: Eliza Bruckner RAMAN, MD;  Location: [facility] OR;  Service: Vascular;  Laterality: Right;   CATARACT EXTRACTION Left 11/2019   CESAREAN SECTION  1983   CHOLECYSTECTOMY N/A 03/05/2020   Procedure: LAPAROSCOPIC CHOLECYSTECTOMY WITH INTRAOPERATIVE CHOLANGIOGRAM;  Surgeon: Belinda Cough, MD;  Location: [facility] ORS;  Service: General;  Laterality: N/A;   COLONOSCOPY     [facility] said no polyps were found   ESOPHAGOGASTRODUODENOSCOPY (EGD) WITH PROPOFOL  Left 08/26/2014   Procedure: ESOPHAGOGASTRODUODENOSCOPY (EGD) WITH PROPOFOL ;  Surgeon: Elsie Cree, MD;  Location: [facility] ENDOSCOPY;  Service: Endoscopy;  Laterality: Left;   ESOPHAGOGASTRODUODENOSCOPY (EGD) WITH PROPOFOL  N/A 03/03/2020   Procedure: ESOPHAGOGASTRODUODENOSCOPY (EGD) WITH PROPOFOL ;  Surgeon: Albertus Gordy HERO, MD;  Location: [facility] ENDOSCOPY;  Service: Gastroenterology;  Laterality: N/A;   IR FLUORO GUIDE CV LINE RIGHT  02/15/2022   IR US  GUIDE VASC ACCESS RIGHT  02/15/2022   PERIPHERAL VASCULAR BALLOON ANGIOPLASTY Right 09/25/2022   Procedure: PERIPHERAL VASCULAR BALLOON ANGIOPLASTY;  Surgeon: Serene Gaile ORN, MD;  Location: [facility] INVASIVE CV LAB;  Service: Cardiovascular;  Laterality: Right;  AVF   RIGHT HEART CATH N/A 07/14/2021   Procedure: RIGHT HEART CATH;  Surgeon: Rolan Ezra RAMAN, MD;  Location: [facility] INVASIVE CV  LAB;  Service: Cardiovascular;  Laterality: N/A;   RIGHT/LEFT HEART CATH AND CORONARY ANGIOGRAPHY N/A 04/27/2021   Procedure: RIGHT/LEFT HEART CATH AND CORONARY ANGIOGRAPHY;  Surgeon: Court Dorn PARAS, MD;  Location: [facility] INVASIVE CV LAB;  Service: Cardiovascular;  Laterality: N/A;   Family History  Family History  Problem Relation Age of Onset   Hypertension Mother    Heart disease Mother    Diabetes Mother    Thyroid  disease Mother    Congestive Heart Failure Mother    Breast cancer Maternal Grandmother    Colon cancer Maternal Grandfather    Heart attack Sister    Heart disease Brother    Hyperlipidemia Brother    Hypertension Brother    Diabetes Father    Breast cancer Maternal Aunt    Social History  reports that she quit smoking about 49 years ago. Her smoking use included cigarettes. She started smoking about 43 years ago. She has a 22 pack-year smoking history. She has never been exposed to tobacco smoke. She has never used smokeless tobacco. She reports that she does not drink alcohol and does not use drugs. Allergies  Allergies  Allergen Reactions   Hydralazine  Other (See Comments)   Hydralazine  Hcl Other (See Comments)    Hair loss   Hydrocodone Itching and Other (See Comments)    Upset stomach   Metformin  And Related Nausea And Vomiting and Other (See Comments)    Stomach pains, also   Other Nausea Only and Other (See Comments)    Lettuce- Does not digest this!!   Plaquenil [Hydroxychloroquine Sulfate] Hives   Shellfish Allergy Nausea And Vomiting   Shellfish-Derived Products Nausea Only and Other (See Comments)    Caused an upset stomach   Shrimp (Diagnostic) Nausea Only and Other (See Comments)    Upset stomach    Sulfa Antibiotics Hives   Sulfur  Hives   Home medications Prior to Admission medications   Medication Sig Start Date End Date Taking? Authorizing Provider  acetaminophen  (TYLENOL ) 500 MG tablet Take 1,000 mg by mouth every 8 (eight) hours as needed  for mild pain (pain score 1-3), moderate pain (pain score 4-6) or headache.   Yes [provider]  albuterol  (VENTOLIN  HFA) 108 (90 Base) MCG/ACT inhaler Inhale 2 puffs into the lungs every 6 (six) hours as needed. Patient taking differently: Inhale 2 puffs into the lungs every 6 (six) hours as needed for wheezing or shortness of breath. 08/21/23  Yes [name], MD  ALPRAZolam  (XANAX ) 0.25 MG tablet Take 1 tablet (0.25 mg total) by mouth on Monday, Wednesday and Friday at hemodialysis. 06/10/24  Yes [name], MD  amLODipine  (NORVASC ) 10 MG tablet Take 1 tablet (10 mg total) by mouth daily for hypertension 06/04/24  Yes [name], MD  aspirin  81 MG EC tablet Take 1 tablet (81 mg total) by mouth daily. 09/26/21  Yes Milford, Harlene HERO, FNP  atorvastatin  (LIPITOR) 40 MG tablet Take 1 tablet (40 mg total) by mouth daily for cholesterol. 06/04/24  Yes [name], MD  ezetimibe  (ZETIA ) 10 MG  tablet Take 1 tablet (10 mg total) by mouth daily for cholesterol. 06/04/24  Yes [name], MD  insulin  glargine (LANTUS  SOLOSTAR) 100 UNIT/ML Solostar Pen Inject 5 Units into the skin 2 (two) times daily. Increased by Provider on 6/24 06/16/24  Yes [name], MD  isosorbide  mononitrate (IMDUR ) 30 MG 24 hr tablet Take 2 tablets (60 mg total) by mouth daily for blood pressure. 06/04/24  Yes [name], MD  labetalol  (NORMODYNE ) 300 MG tablet Take 1 tablet (300 mg total) by mouth 2 (two) times daily. Patient taking differently: Take 300 mg by mouth daily. Take one tablet by mouth on Tues, Thurs, Saturday, and Sunday in the morning (non-dialysis days). Take one tablet by mouth on Mon, Wed,and Fri in the evening after dialysis. 06/04/24  Yes [name], MD  levothyroxine  (SYNTHROID ) 25 MCG tablet Take 1 tablet (25 mcg total) by mouth daily for low thyroid  hormone. 06/04/24  Yes [name], MD  omeprazole  (PRILOSEC) 20 MG capsule Take 2 capsules (40  mg total) by mouth daily for acid reflux. 07/13/24  Yes [name], MD  ondansetron  (ZOFRAN ) 4 MG tablet Take 1 tablet (4 mg total) by mouth every 8 (eight) hours as needed for nausea. 04/29/24  Yes   oxyCODONE -acetaminophen  (PERCOCET) 5-325 MG tablet Take 1 tablet by mouth every 4 (four) hours as needed for severe pain (pain score 7-10). 08/23/24  Yes Raford Lenis, MD  polyethylene glycol powder (GLYCOLAX /MIRALAX ) 17 GM/SCOOP powder Mix 1 capful with 8 ounces of fluid and take by mouth daily as needed. 03/25/24  Yes [name], MD  prochlorperazine  (COMPAZINE ) 10 MG tablet Take 1 tablet (10 mg total) by mouth every 6 (six) hours as needed for nausea or vomiting. 08/23/24  Yes Raford Lenis, MD  sevelamer  carbonate (RENVELA ) 800 MG tablet Take 3 tablets (2,400 mg total) by mouth 3 (three) times daily with meals and 1 tablet with snacks. 09/04/23  Yes   Continuous Glucose Sensor (FREESTYLE LIBRE 3 PLUS SENSOR) MISC Change sensor every 15 days. 08/27/24   Motwani, Komal, MD  Continuous Glucose Sensor (FREESTYLE LIBRE 3 SENSOR) MISC by Does not apply route.    [provider]  Glucagon  (BAQSIMI  ONE PACK) 3 MG/DOSE POWD Place 1 Device into the nose as needed (Low blood sugar with impaired consciousness). Patient not taking: Reported on 09/03/2024 08/27/24   Motwani, Komal, MD  Insulin  NPH Isophane & Regular (RELION 70/30 [place]) Inject 35 Units into the skin 2 (two) times daily.  08/17/14  [provider]     Vitals:   09/15/24 0116 09/15/24 0325 09/15/24 0422 09/15/24 0900  BP: (!) 199/97 (!) 196/104 (!) 194/94 (!) 161/101  Pulse: 94 96 88 90  Resp: 20  18 18   Temp: 97.8 F (36.6 C)  97.9 F (36.6 C) 97.8 F (36.6 C)  TempSrc: Oral   Oral  SpO2: 100%  98% 98%   Exam Gen alert, no distress, on RA Sclera anicteric, throat clear  No jvd or bruits Chest clear bilat to bases RRR no MRG Abd soft ntnd no mass or ascites +bs Ext L BKA, no LE or UE edema Neuro is alert, Ox 3 ,  nf      Home bp meds: Norvasc  10mg  every day Labetalol  200mg     OP HD: [facility] MWF 4h  B400  105.7kg    2K bath   TDC   Heparin  5000+ 3000 midrun Mircera 75 mcg q 4 wks,  has not started yet Last OP HD 9/26, post wt 105.9kg  Gets to the dry wt   Assessment/ Plan: Abdominal pain/ N/V: felt to be gastroparesis. CT abd negative, recent hx of colitis. Per pmd.  ESRD: on HD MWF. Last HD Friday. HD today off schedule.  HTN: bp's high, cont home meds.  Volume: euvolemic on exam, not eating much. Get good wts.  Anemia of esrd: Hb 10-12 here, if Hb drops we can initiate the esa that is due to start at OP unit.   Myer Fret  MD [facility] 09/15/2024, 12:02 PM  Recent Labs  Lab 09/14/24 1047 09/15/24 0206  HGB 12.0 10.5*  ALBUMIN  4.3  --   CALCIUM  9.6 8.7*  CREATININE 6.14* 6.66*  K 5.4* 4.6   Inpatient medications:  amLODipine   10 mg Oral Daily   Chlorhexidine  Gluconate Cloth  6 each Topical Q0600   heparin   4,000 Units Dialysis Once in dialysis   heparin   5,000 Units Subcutaneous Q8H   heparin   5,000 Units Dialysis Once in dialysis   insulin  aspart  0-6 Units Subcutaneous Q4H   [START ON 09/16/2024] labetalol   300 mg Oral Once per day on Monday Wednesday Friday   [START ON 09/17/2024] labetalol   300 mg Oral Once per day on Sunday Tuesday Thursday Saturday   metoCLOPramide  (REGLAN ) injection  5 mg Intravenous Q12H   sevelamer  carbonate  2,400 mg Oral TID WC   sorbitol   30 mL Oral BID    anticoagulant sodium citrate      anticoagulant sodium citrate      acetaminophen  **OR** acetaminophen , alteplase , anticoagulant sodium citrate , anticoagulant sodium citrate , feeding supplement (NEPRO CARB STEADY), feeding supplement (NEPRO CARB STEADY), heparin , heparin , labetalol , lidocaine  (PF), lidocaine  (PF), lidocaine -prilocaine , lidocaine -prilocaine , naLOXone (NARCAN)  injection, pentafluoroprop-tetrafluoroeth, pentafluoroprop-tetrafluoroeth, trimethobenzamide

## 2024-09-16 DIAGNOSIS — Z992 Dependence on renal dialysis: Secondary | ICD-10-CM

## 2024-09-16 DIAGNOSIS — E1169 Type 2 diabetes mellitus with other specified complication: Secondary | ICD-10-CM

## 2024-09-16 DIAGNOSIS — R1084 Generalized abdominal pain: Secondary | ICD-10-CM | POA: Diagnosis not present

## 2024-09-16 DIAGNOSIS — N186 End stage renal disease: Secondary | ICD-10-CM

## 2024-09-16 DIAGNOSIS — R112 Nausea with vomiting, unspecified: Secondary | ICD-10-CM | POA: Diagnosis not present

## 2024-09-16 LAB — GLUCOSE, CAPILLARY
Glucose-Capillary: 142 mg/dL — ABNORMAL HIGH (ref 70–99)
Glucose-Capillary: 168 mg/dL — ABNORMAL HIGH (ref 70–99)
Glucose-Capillary: 135 mg/dL — ABNORMAL HIGH (ref 70–99)
Glucose-Capillary: 192 mg/dL — ABNORMAL HIGH (ref 70–99)
Glucose-Capillary: 138 mg/dL — ABNORMAL HIGH (ref 70–99)

## 2024-09-16 LAB — HEPATITIS B SURFACE ANTIBODY, QUANTITATIVE: Hep B S AB Quant (Post): 1691 m[IU]/mL

## 2024-09-16 MED ORDER — TRAMADOL HCL 50 MG PO TABS
50.0000 mg | ORAL_TABLET | Freq: Once | ORAL | Status: AC
  Administered 2024-09-16: 50 mg via ORAL
  Filled 2024-09-16: qty 1

## 2024-09-16 MED ORDER — ALUM & MAG HYDROXIDE-SIMETH 200-200-20 MG/5ML PO SUSP
30.0000 mL | ORAL | Status: DC | PRN
  Administered 2024-09-16 – 2024-09-17 (×2): 30 mL via ORAL
  Filled 2024-09-16 (×2): qty 30

## 2024-09-16 NOTE — Progress Notes (Signed)
  [name] Progress Note   Subjective:  Completed dialysis Tuesday - net UF 2.6L  Seen in room. On nasal oxygen.  Still nauseated. Denies cp, sob  Objective Vitals:   09/16/24 0522 09/16/24 0802 09/16/24 0822 09/16/24 1000  BP: (!) 142/108 (!) 206/109 (!) 206/109 (!) 165/92  Pulse: 94 96    Resp: 18 18    Temp: 98.5 F (36.9 C) 98.4 F (36.9 C)    TempSrc: Oral Oral    SpO2: 100% (!) 89%    Weight:        Additional Objective Labs: Basic Metabolic Panel: Recent Labs  Lab 09/14/24 1047 09/15/24 0206  NA 137 136  K 5.4* 4.6  CL 94* 98  CO2 20* 21*  GLUCOSE 193* 182*  BUN 32* 35*  CREATININE 6.14* 6.66*  CALCIUM  9.6 8.7*   CBC: Recent Labs  Lab 09/14/24 1047 09/15/24 0206  WBC 13.1* 15.9*  NEUTROABS 11.0*  --   HGB 12.0 10.5*  HCT 37.7 32.7*  MCV 93.1 90.1  PLT 229 264   Blood Culture    Component Value Date/Time   SDES BLOOD BLOOD LEFT ARM 03/23/2023 0021   SDES BLOOD BLOOD LEFT ARM 03/23/2023 0021   SPECREQUEST  03/23/2023 0021    BOTTLES DRAWN AEROBIC AND ANAEROBIC Blood Culture adequate volume   SPECREQUEST  03/23/2023 0021    BOTTLES DRAWN AEROBIC AND ANAEROBIC Blood Culture adequate volume   CULT  03/23/2023 0021    NO GROWTH 5 DAYS Performed at [facility] Lab, [address], [place], [place] [address]    CULT  03/23/2023 0021    NO GROWTH 5 DAYS Performed at [facility] Lab, [address] [place], [place] [address]    REPTSTATUS 03/28/2023 FINAL 03/23/2023 0021   REPTSTATUS 03/28/2023 FINAL 03/23/2023 0021    Physical Exam General: Alert, nad, on nasal oxygen Heart: RRR Lungs: Clear Abdomen: non-tender  Extremities: bilat BKA; no edema Dialysis Access: [facility]  Medications:   amLODipine   10 mg Oral Daily   Chlorhexidine  Gluconate Cloth  6 each Topical Q0600   heparin   5,000 Units Subcutaneous Q8H   insulin  aspart  0-6 Units Subcutaneous Q4H   labetalol   300 mg Oral Once per day on Monday Wednesday Friday    [START ON 09/17/2024] labetalol   300 mg Oral Once per day on Sunday Tuesday Thursday Saturday   metoCLOPramide  (REGLAN ) injection  5 mg Intravenous Q8H   sevelamer  carbonate  2,400 mg Oral TID WC   sorbitol   30 mL Oral BID    Dialysis Orders:  [facility] MWF 4h  B400  105.7kg    2K bath   TDC   Heparin  5000+ 3000 midrun Mircera 75 mcg q 4 wks, has not started yet Last OP HD 9/26, post wt 105.9kg  Gets to the dry wt  Assessment/Plan: Abdominal pain with nausea/vomiting.  Felt to be gastroparesis. CT abd negative, recent hx of colitis. Per pmd.  ESRD: on HD MWF. Last HD Friday. Had HD Tues off schedule. Short HD today to get back on schedule  HTN: BP elevated. Continue home meds. Push UF with HD  Volume: euvolemic on exam, not eating much. Get good wts.  Anemia: Hgb 10s. Follow trends. Start ESA if Hgb drops MBD: Continue home binders when tolerating PO. Check phos with HD.  DMT2: Insulin  per primary       Nancy Ronnald Acosta PA-C [facility] 09/16/2024,10:51 AM

## 2024-09-16 NOTE — Progress Notes (Signed)
 PT Cancellation Note  Patient Details Name: Nancy [name] MRN: [id number] DOB: Apr 16, 1961   Cancelled Treatment:    Reason Eval/Treat Not Completed: PT screened, no needs identified, will sign off; patient reports up earlier today in chair and feels close to her baseline.  RN reports pt's spouse helped her up then back to bed.  She thinks he feels the same.  PT will sign off since pt close to baseline.  Please order again if mobility declines.  Thanks.   Nancy [name] 09/16/2024, 3:18 PM Nancy [name], PT Acute Rehabilitation Services Office:[phone] 09/16/2024

## 2024-09-16 NOTE — Progress Notes (Signed)
 [facility]                                                                               Nancy [name], is a 63 y.o. 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 Repeat CT abd and pelvis does not show any acute pathology.   Assessment & Plan    Assessment and Plan:   Nausea, vomiting and upper back pain Probably secondary gastroparesis.. Patient started on clear liquid diet, continue the same.  Advance diet as tolerated.   Type 2 diabetes mellitus non-insulin -dependent Continue with sliding scale insulin  for now.   Stage renal disease on dialysis Monday Wednesday Friday.    Anemia of chronic disease Transfuse to keep hemoglobin greater than 7.   Essential Continue with Norvasc  10mg  daily.    Leukocytosis Recheck WBC in a.m.   Estimated body mass index is 41.24 kg/m (pended) as calculated from the following:   Height as of 08/27/24: 5' 3 (1.6 m).   Weight as of this encounter: (P) 105.6 kg.  Code Status: DNR  DVT Prophylaxis:  heparin  injection 5,000 Units Start: 09/15/24 0600   Level of Care: Level of care: Telemetry Medical Family Communication: none at bedside.   Disposition Plan:     Remains inpatient appropriate:  pending clinical improvement.   Procedures:  None.   Consultants:   Nephrology.  Antimicrobials:    Anti-infectives (From admission, onward)    None        Medications  Scheduled Meds:  amLODipine   10 mg Oral Daily   Chlorhexidine  Gluconate Cloth  6 each Topical Q0600   heparin   5,000 Units Subcutaneous Q8H   insulin  aspart  0-6 Units Subcutaneous Q4H   labetalol   300 mg Oral Once per day on Monday Wednesday Friday   [START ON 09/17/2024] labetalol   300 mg Oral Once per day on Sunday Tuesday Thursday Saturday   metoCLOPramide  (REGLAN ) injection  5 mg Intravenous Q8H   sevelamer  carbonate  2,400 mg Oral TID WC   sorbitol   30 mL Oral BID   Continuous Infusions: PRN Meds:.acetaminophen  **OR** acetaminophen , alum & mag hydroxide-simeth, labetalol , naLOXone (NARCAN)  injection, trimethobenzamide    Subjective:   Nancy [name] was seen and examined today.  Abdominal pain and some distention. No BM yet.    Objective:   Vitals:   09/16/24 0802 09/16/24 0822 09/16/24 1000 09/16/24 1204  BP: (!) 206/109 (!) 206/109 (!) 165/92 134/79  Pulse: 96   81  Resp: 18   18  Temp: 98.4 F (36.9 C)   97.6  F (36.4 C)  TempSrc: Oral   Oral  SpO2: (!) 89%   100%  Weight:        Intake/Output Summary (Last 24 hours) at 09/16/2024 1454 Last data filed at 09/15/2024 1730 Gross per 24 hour  Intake --  Output 2600 ml  Net -2600 ml   Filed Weights   09/15/24 1400 09/15/24 1730  Weight: 107.1 kg (P) 105.6 kg     Exam General: Alert and oriented x 3, NAD Cardiovascular: S1 S2 auscultated, no murmurs, RRR Respiratory: Clear to auscultation bilaterally, no wheezing, rales or rhonchi Gastrointestinal: Soft, nontender, nondistended, + bowel sounds Ext: no pedal edema bilaterally Neuro: AAOx3, Cr N's II- XII. Strength 5/5 upper and lower extremities bilaterally Skin: No rashes Psych: Normal affect and demeanor, alert and oriented x3    Data Reviewed:  I have personally reviewed following labs and imaging studies   CBC Lab Results  Component Value Date   WBC 15.9 (H)  09/15/2024   RBC 3.63 (L) 09/15/2024   HGB 10.5 (L) 09/15/2024   HCT 32.7 (L) 09/15/2024   MCV 90.1 09/15/2024   MCH 28.9 09/15/2024   PLT 264 09/15/2024   MCHC 32.1 09/15/2024   RDW 15.6 (H) 09/15/2024   LYMPHSABS 1.2 09/14/2024   MONOABS 0.6 09/14/2024   EOSABS 0.2 09/14/2024   BASOSABS 0.1 09/14/2024     Last metabolic panel Lab Results  Component Value Date   NA 136 09/15/2024   K 4.6 09/15/2024   CL 98 09/15/2024   CO2 21 (L) 09/15/2024   BUN 35 (H) 09/15/2024   CREATININE 6.66 (H) 09/15/2024   GLUCOSE 182 (H) 09/15/2024   GFRNONAA 6 (L) 09/15/2024   GFRAA 58 (L) 05/06/2020   CALCIUM  8.7 (L) 09/15/2024   PHOS 6.2 (H) 08/10/2024   PROT 7.4 09/14/2024   ALBUMIN  4.3 09/14/2024   LABGLOB 3.1 07/11/2021   AGRATIO 1.1 (L) 06/30/2021   BILITOT 0.5 09/14/2024   ALKPHOS 113 09/14/2024   AST 18 09/14/2024   ALT 15 09/14/2024   ANIONGAP 17 (H) 09/15/2024    CBG (last 3)  Recent Labs    09/16/24 0402 09/16/24 0800 09/16/24 1203  GLUCAP 142* 168* 135*      Coagulation Profile: No results for input(s): INR, PROTIME in the last 168 hours.   Radiology Studies: No results found.     [name] [facility] 09/16/2024, 2:54 PM  Available via Epic secure chat 7am-7pm After 7 pm, please refer to night coverage provider listed on amion.

## 2024-09-16 NOTE — Plan of Care (Signed)

## 2024-09-17 ENCOUNTER — Inpatient Hospital Stay (HOSPITAL_COMMUNITY): Admission: RE | Admit: 2024-09-17 | Source: Ambulatory Visit

## 2024-09-17 ENCOUNTER — Ambulatory Visit: Admitting: Vascular Surgery

## 2024-09-17 DIAGNOSIS — R1084 Generalized abdominal pain: Secondary | ICD-10-CM | POA: Diagnosis not present

## 2024-09-17 DIAGNOSIS — E1169 Type 2 diabetes mellitus with other specified complication: Secondary | ICD-10-CM | POA: Diagnosis not present

## 2024-09-17 DIAGNOSIS — N186 End stage renal disease: Secondary | ICD-10-CM | POA: Diagnosis not present

## 2024-09-17 DIAGNOSIS — R112 Nausea with vomiting, unspecified: Secondary | ICD-10-CM | POA: Diagnosis not present

## 2024-09-17 LAB — CBC
HCT: 30.8 % — ABNORMAL LOW (ref 36.0–46.0)
Hemoglobin: 10 g/dL — ABNORMAL LOW (ref 12.0–15.0)
MCH: 29.7 pg (ref 26.0–34.0)
MCHC: 32.5 g/dL (ref 30.0–36.0)
MCV: 91.4 fL (ref 80.0–100.0)
Platelets: 248 K/uL (ref 150–400)
RBC: 3.37 MIL/uL — ABNORMAL LOW (ref 3.87–5.11)
RDW: 15.8 % — ABNORMAL HIGH (ref 11.5–15.5)
WBC: 10.7 K/uL — ABNORMAL HIGH (ref 4.0–10.5)
nRBC: 0 % (ref 0.0–0.2)

## 2024-09-17 LAB — GLUCOSE, CAPILLARY
Glucose-Capillary: 158 mg/dL — ABNORMAL HIGH (ref 70–99)
Glucose-Capillary: 118 mg/dL — ABNORMAL HIGH (ref 70–99)
Glucose-Capillary: 94 mg/dL (ref 70–99)
Glucose-Capillary: 122 mg/dL — ABNORMAL HIGH (ref 70–99)
Glucose-Capillary: 160 mg/dL — ABNORMAL HIGH (ref 70–99)

## 2024-09-17 LAB — RENAL FUNCTION PANEL
Albumin: 3.3 g/dL — ABNORMAL LOW (ref 3.5–5.0)
Anion gap: 18 — ABNORMAL HIGH (ref 5–15)
BUN: 35 mg/dL — ABNORMAL HIGH (ref 8–23)
CO2: 22 mmol/L (ref 22–32)
Calcium: 8.4 mg/dL — ABNORMAL LOW (ref 8.9–10.3)
Chloride: 92 mmol/L — ABNORMAL LOW (ref 98–111)
Creatinine, Ser: 6.52 mg/dL — ABNORMAL HIGH (ref 0.44–1.00)
GFR, Estimated: 7 mL/min — ABNORMAL LOW (ref >60)
Glucose, Bld: 116 mg/dL — ABNORMAL HIGH (ref 70–99)
Phosphorus: 5.7 mg/dL — ABNORMAL HIGH (ref 2.5–4.6)
Potassium: 4 mmol/L (ref 3.5–5.1)
Sodium: 132 mmol/L — ABNORMAL LOW (ref 135–145)

## 2024-09-17 MED ORDER — METOCLOPRAMIDE HCL 5 MG/ML IJ SOLN
10.0000 mg | Freq: Three times a day (TID) | INTRAMUSCULAR | Status: DC
  Administered 2024-09-17 – 2024-09-18 (×3): 10 mg via INTRAVENOUS
  Filled 2024-09-17 (×2): qty 2

## 2024-09-17 NOTE — Progress Notes (Signed)
   09/17/24 1107  Vitals  Temp 98.1 F (36.7 C)  Pulse Rate 74  Resp 14  BP 139/72  SpO2 100 %  O2 Device Nasal Cannula  Weight 102.5 kg  Type of Weight Post-Dialysis  Oxygen Therapy  O2 Flow Rate (L/min) 3 L/min  Post Treatment  Dialyzer Clearance Lightly streaked  Hemodialysis Intake (mL) 0 mL  Liters Processed 72  Fluid Removed (mL) 1900 mL  Tolerated HD Treatment Yes   Received patient in bed to unit.  Alert and oriented.  Informed consent signed and in chart.   TX duration:3hrs  Patient tolerated well.  Transported back to the room  Alert, without acute distress.  Hand-off given to patient's nurse.   Access used: [facility] Access issues: none  Total UF removed: 1.9L Medication(s) given: none    Na'Shaminy T Gid [facility]

## 2024-09-17 NOTE — Plan of Care (Signed)
  Problem: Activity: Goal: Risk for activity intolerance will decrease Outcome: Progressing   Problem: Coping: Goal: Level of anxiety will decrease Outcome: Progressing   Problem: Nutrition: Goal: Adequate nutrition will be maintained Outcome: Progressing   Problem: Elimination: Goal: Will not experience complications related to bowel motility Outcome: Progressing

## 2024-09-17 NOTE — Progress Notes (Signed)
 Transition of Care [facility]) - Inpatient Brief Assessment   Patient Details  Name: Nancy [name] MRN: [id number] Date of Birth: 1961/12/07  [facility] CM/SW Contact:    Rosaline JONELLE Joe, RN Phone Number: 09/17/2024, 12:25 PM   Clinical Narrative:    Transition of Care Asessment:

## 2024-09-17 NOTE — Progress Notes (Signed)
 Pt had episode of nausea around 0130, gave prn antiemetic, pt reports little to no improvement.  Pt stools watery w small pieces throughout. Pt was intermittently incontinent after the sorbitol .

## 2024-09-17 NOTE — Plan of Care (Signed)

## 2024-09-17 NOTE — Progress Notes (Signed)
 [facility]                                                                               Nancy [name], is a 63 y.o. 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 Repeat CT abd and pelvis does not show any acute pathology.   Assessment & Plan    Assessment and Plan:   Nausea, vomiting and upper back pain Probably secondary gastroparesis.. Patient started on clear liquid diet, advance diet as tolerated. .   Type 2 diabetes mellitus non-insulin -dependent Continue with sliding scale insulin  for now. CBG (last 3)  Recent Labs    09/17/24 0624 09/17/24 1141 09/17/24 1603  GLUCAP 118* 94 122*      End Stage renal disease on dialysis  Monday Wednesday Friday. Nephrology on board.     Anemia of chronic disease Transfuse to keep hemoglobin greater than 7.    Essential Hypertension: Continue with Norvasc  10mg  daily.    Leukocytosis Improving, unclear etiology .  No signs of infection so far.    Estimated body mass index is 40.03 kg/m as calculated from the following:   Height as of 08/27/24: 5' 3 (1.6 m).   Weight as of this encounter: 102.5 kg.  Code Status: DNR  DVT Prophylaxis:  heparin  injection 5,000 Units Start: 09/15/24 0600   Level of Care: Level of care: Telemetry Medical Family Communication: none at bedside.    Disposition Plan:     Remains inpatient appropriate:  pending clinical improvement.   Procedures:  None.   Consultants:   Nephrology.  Antimicrobials:   Anti-infectives (From admission, onward)    None        Medications  Scheduled Meds:  amLODipine   10 mg Oral Daily   Chlorhexidine  Gluconate Cloth  6 each Topical Q0600   heparin   5,000 Units Subcutaneous Q8H   insulin  aspart  0-6 Units Subcutaneous Q4H   labetalol   300 mg Oral Once per day on Monday Wednesday Friday   labetalol   300 mg Oral Once per day on Sunday Tuesday Thursday Saturday   metoCLOPramide  (REGLAN ) injection  10 mg Intravenous Q8H   sevelamer  carbonate  2,400 mg Oral TID WC   sorbitol   30 mL Oral BID   Continuous Infusions: PRN Meds:.acetaminophen  **OR** acetaminophen , alum & mag hydroxide-simeth, labetalol , naLOXone (NARCAN)  injection, trimethobenzamide    Subjective:   [name] was seen and examined today. No chest pain or sob. No vomiting , still has some nausea. Wants to advance diet.    Objective:   Vitals:  09/17/24 1107 09/17/24 1140 09/17/24 1141 09/17/24 1145  BP: 139/72 139/72 (!) 155/70 (!) 155/70  Pulse: 74 74 79   Resp: 14     Temp: 98.1 F (36.7 C)  98.3 F (36.8 C)   TempSrc:      SpO2: 100%  100%   Weight: 102.5 kg       Intake/Output Summary (Last 24 hours) at 09/17/2024 1551 Last data filed at 09/17/2024 1107 Gross per 24 hour  Intake --  Output 1900 ml  Net -1900 ml   Filed Weights   09/15/24 1730 09/17/24 0756 09/17/24 1107  Weight: (P) 105.6 kg 104.3 kg 102.5 kg     Exam General exam: Appears calm and comfortable  Respiratory system: Clear to auscultation. Respiratory effort normal. Cardiovascular system: S1 & S2 heard, RRR. No JVD Gastrointestinal system: Abdomen is nondistended, soft and nontender. Central nervous system: Alert and oriented. Extremities: no cyanosis.  Skin: No rashes,  Psychiatry: Mood & affect appropriate.    Data  Reviewed:  I have personally reviewed following labs and imaging studies   CBC Lab Results  Component Value Date   WBC 10.7 (H) 09/17/2024   RBC 3.37 (L) 09/17/2024   HGB 10.0 (L) 09/17/2024   HCT 30.8 (L) 09/17/2024   MCV 91.4 09/17/2024   MCH 29.7 09/17/2024   PLT 248 09/17/2024   MCHC 32.5 09/17/2024   RDW 15.8 (H) 09/17/2024   LYMPHSABS 1.2 09/14/2024   MONOABS 0.6 09/14/2024   EOSABS 0.2 09/14/2024   BASOSABS 0.1 09/14/2024     Last metabolic panel Lab Results  Component Value Date   NA 132 (L) 09/17/2024   K 4.0 09/17/2024   CL 92 (L) 09/17/2024   CO2 22 09/17/2024   BUN 35 (H) 09/17/2024   CREATININE 6.52 (H) 09/17/2024   GLUCOSE 116 (H) 09/17/2024   GFRNONAA 7 (L) 09/17/2024   GFRAA 58 (L) 05/06/2020   CALCIUM  8.4 (L) 09/17/2024   PHOS 5.7 (H) 09/17/2024   PROT 7.4 09/14/2024   ALBUMIN  3.3 (L) 09/17/2024   LABGLOB 3.1 07/11/2021   AGRATIO 1.1 (L) 06/30/2021   BILITOT 0.5 09/14/2024   ALKPHOS 113 09/14/2024   AST 18 09/14/2024   ALT 15 09/14/2024   ANIONGAP 18 (H) 09/17/2024    CBG (last 3)  Recent Labs    09/17/24 0120 09/17/24 0624 09/17/24 1141  GLUCAP 158* 118* 94      Coagulation Profile: No results for input(s): INR, PROTIME in the last 168 hours.   Radiology Studies: No results found.     [name] [facility] 09/17/2024, 3:51 PM  Available via Epic secure chat 7am-7pm After 7 pm, please refer to night coverage provider listed on amion.

## 2024-09-17 NOTE — Progress Notes (Signed)
 [facility] Progress Note   Subjective:  Seen in [facility]. On dialysis She continues to have nausea. Hasn't been able advance diet.   Objective Vitals:   09/17/24 0759 09/17/24 0815 09/17/24 0830 09/17/24 0900  BP: (!) 180/79 (!) 183/83 (!) 162/78 (!) 157/82  Pulse: 76 77 78 77  Resp: 15 17 20 16   Temp:      TempSrc:      SpO2: 100% 100% 100% 100%  Weight:        Additional Objective Labs: Basic Metabolic Panel: Recent Labs  Lab 09/14/24 1047 09/15/24 0206 09/17/24 0808  NA 137 136 132*  K 5.4* 4.6 4.0  CL 94* 98 92*  CO2 20* 21* 22  GLUCOSE 193* 182* 116*  BUN 32* 35* 35*  CREATININE 6.14* 6.66* 6.52*  CALCIUM  9.6 8.7* 8.4*  PHOS  --   --  5.7*   CBC: Recent Labs  Lab 09/14/24 1047 09/15/24 0206 09/17/24 0808  WBC 13.1* 15.9* 10.7*  NEUTROABS 11.0*  --   --   HGB 12.0 10.5* 10.0*  HCT 37.7 32.7* 30.8*  MCV 93.1 90.1 91.4  PLT 229 264 248   Blood Culture    Component Value Date/Time   SDES BLOOD BLOOD LEFT ARM 03/23/2023 0021   SDES BLOOD BLOOD LEFT ARM 03/23/2023 0021   SPECREQUEST  03/23/2023 0021    BOTTLES DRAWN AEROBIC AND ANAEROBIC Blood Culture adequate volume   SPECREQUEST  03/23/2023 0021    BOTTLES DRAWN AEROBIC AND ANAEROBIC Blood Culture adequate volume   CULT  03/23/2023 0021    NO GROWTH 5 DAYS Performed at [facility], [address] [place], [place] [address]    CULT  03/23/2023 0021    NO GROWTH 5 DAYS Performed at [facility], [address], [place], [place] [address]    REPTSTATUS 03/28/2023 FINAL 03/23/2023 0021   REPTSTATUS 03/28/2023 FINAL 03/23/2023 0021    Physical Exam General: Alert, nad, on nasal oxygen Heart: RRR Lungs: Clear Abdomen: non-tender  Extremities: bilat BKA; no edema Dialysis Access: [facility]  Medications:   amLODipine   10 mg Oral Daily   Chlorhexidine  Gluconate Cloth  6 each Topical Q0600   heparin   5,000 Units Subcutaneous Q8H   insulin  aspart  0-6 Units Subcutaneous Q4H    labetalol   300 mg Oral Once per day on Monday Wednesday Friday   labetalol   300 mg Oral Once per day on Sunday Tuesday Thursday Saturday   metoCLOPramide  (REGLAN ) injection  5 mg Intravenous Q8H   sevelamer  carbonate  2,400 mg Oral TID WC   sorbitol   30 mL Oral BID    Dialysis Orders:  [facility] MWF 4h  B400  105.7kg    2K bath   TDC   Heparin  5000+ 3000 midrun Mircera 75 mcg q 4 wks, has not started yet Last OP HD 9/26, post wt 105.9kg  Gets to the dry wt  Assessment/Plan: Abdominal pain with nausea/vomiting.  Felt to be gastroparesis. CT abd negative, recent hx of colitis. Per pmd.  ESRD: on HD MWF. Last HD Friday. Had HD Tues off schedule. Off schedule this week. Try to get back on MWF schedule next week.  HTN: BP elevated. Continue home meds. Push UF with HD  Volume: euvolemic on exam, not eating much. Get good wts.  Anemia: Hgb 10s. Follow trends. Start ESA if Hgb drops MBD: Continue home binders when tolerating PO. Check phos with HD.  DMT2: Insulin  per primary  Maisie Ronnald Acosta PA-C [facility] 09/17/2024,9:06 AM

## 2024-09-17 NOTE — Progress Notes (Signed)
 Transition of Care [facility]) - Inpatient Brief Assessment   Patient Details  Name: Nancy [name] MRN: [id number] Date of Birth: May 29, 1961  [facility] CM/SW Contact:    Rosaline JONELLE Joe, RN Phone Number: 09/17/2024, 12:25 PM   Clinical Narrative: Patient admitted to the hospital with nausea, vomiting and abdominal pain with R/O gastroparesis per MD note.  No IP Care management needs at this time.   Transition of Care Asessment: Insurance and Status: (P) Insurance coverage has been reviewed Patient has primary care physician: (P) Yes Home environment has been reviewed: (P) from home with spouse Prior level of function:: (P) self Prior/Current Home Services: (P) No current home services Social Drivers of Health Review: (P) SDOH reviewed no interventions necessary Readmission risk has been reviewed: (P) Yes Transition of care needs: (P) no transition of care needs at this time

## 2024-09-18 ENCOUNTER — Other Ambulatory Visit (HOSPITAL_BASED_OUTPATIENT_CLINIC_OR_DEPARTMENT_OTHER): Payer: Self-pay

## 2024-09-18 ENCOUNTER — Other Ambulatory Visit: Payer: Self-pay

## 2024-09-18 ENCOUNTER — Other Ambulatory Visit (HOSPITAL_COMMUNITY): Payer: Self-pay

## 2024-09-18 DIAGNOSIS — N186 End stage renal disease: Secondary | ICD-10-CM | POA: Diagnosis not present

## 2024-09-18 DIAGNOSIS — R112 Nausea with vomiting, unspecified: Secondary | ICD-10-CM | POA: Diagnosis not present

## 2024-09-18 DIAGNOSIS — R1084 Generalized abdominal pain: Secondary | ICD-10-CM | POA: Diagnosis not present

## 2024-09-18 DIAGNOSIS — E1169 Type 2 diabetes mellitus with other specified complication: Secondary | ICD-10-CM | POA: Diagnosis not present

## 2024-09-18 LAB — GLUCOSE, CAPILLARY
Glucose-Capillary: 105 mg/dL — ABNORMAL HIGH (ref 70–99)
Glucose-Capillary: 117 mg/dL — ABNORMAL HIGH (ref 70–99)
Glucose-Capillary: 118 mg/dL — ABNORMAL HIGH (ref 70–99)
Glucose-Capillary: 94 mg/dL (ref 70–99)

## 2024-09-18 MED ORDER — METOCLOPRAMIDE HCL 5 MG PO TABS
5.0000 mg | ORAL_TABLET | Freq: Four times a day (QID) | ORAL | 1 refills | Status: AC | PRN
  Filled 2024-09-18 (×2): qty 60, 15d supply, fill #0

## 2024-09-18 MED ORDER — ONDANSETRON HCL 4 MG PO TABS
4.0000 mg | ORAL_TABLET | Freq: Three times a day (TID) | ORAL | 3 refills | Status: DC | PRN
  Filled 2024-09-18 (×2): qty 90, 30d supply, fill #0
  Filled 2024-10-22 – 2024-11-04 (×2): qty 90, 30d supply, fill #1

## 2024-09-18 MED ORDER — SORBITOL 70 % SOLN
30.0000 mL | Freq: Two times a day (BID) | 2 refills | Status: AC
  Filled 2024-09-18 (×2): qty 473, 8d supply, fill #0

## 2024-09-18 NOTE — Plan of Care (Signed)
 [facility] Patient Discharge Orders- [facility]: [facility]   Patient's name: Nancy [name] Admit/DC Dates: 09/14/2024 - 09/18/2024  Discharge Diagnoses: Abdominal pain w nausea/vomiting.  Resolved   Outpatient Dialysis Orders:  -Heparin : No change  -EDW 103.5 kg   -Bath: No change   Anemia Aranesp : Given: --   Date of last dose/amount: --   PRBC's Given: -- Date/# of units: -- ESA dose for discharge: per protocol   Recent Labs  Lab 09/17/24 0808  HGB 10.0*  K 4.0  CALCIUM  8.4*  PHOS 5.7*  ALBUMIN  3.3*   Access intervention/Change: none    Medications: -IV Antibiotics:   OTHER/APPTS/LABS     Completed by: Maisie Ronnald Acosta PA-C   D/C Meds to be reconciled by nurse after every discharge.    Reviewed by: MD:______ RN_______

## 2024-09-18 NOTE — Plan of Care (Signed)

## 2024-09-18 NOTE — Care Management Important Message (Signed)
 Important Message  Patient Details  Name: Nancy [name] MRN: [id number] Date of Birth: 12-05-1961   Important Message Given:  Yes - Medicare IM   Patient left prior to IM deliver will mail a copy to the patient home address.   Tyjuan Demetro 09/18/2024, 2:39 PM

## 2024-09-18 NOTE — Progress Notes (Signed)
 D/c noted. Contacted out-pt HD clinic [facility] to inform of pt d/c and to anticipate arrival Monday. NO further support needed at this time.   Evon Dejarnett [facility] [phone]

## 2024-09-19 NOTE — Discharge Summary (Signed)
 Physician Discharge Summary   Patient: Nancy [name] MRN: [id number] DOB: [birth year]  Admit date:     09/14/2024  Discharge date: 09/18/2024  Discharge Physician: Elgie Butter   PCP: [name], MD   Recommendations at discharge:  Please follow up with PCp in one week.  Please follow up with cbc and BMP in one week.   Discharge Diagnoses: Principal Problem:   Intractable nausea and vomiting Active Problems:   ESRD on dialysis (HCC)   Type 2 diabetes mellitus (HCC)   Hypertensive urgency   Generalized abdominal pain  Resolved Problems:   * No resolved hospital problems. 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 Repeat CT abd and pelvis does not show any acute pathology.   Assessment and Plan:  Nausea, vomiting and upper back pain Probably secondary gastroparesis.. Patient started on clear liquid diet, advanced diet as tolerated. SABRA She was asked to see Gastroenterology outpatient for further evalation.  She was also discharged on reglan .      Type 2 diabetes mellitus non-insulin -dependent Resume home  meds.   End Stage renal disease on dialysis  Monday Wednesday Friday. Nephrology on board.        Anemia of chronic disease Transfuse to keep hemoglobin greater than 7.       Essential Hypertension: Continue with Norvasc  10mg  daily.      Leukocytosis Improving, unclear etiology .  No signs of infection so far.      Estimated body mass index is 40.03 kg/m as calculated from the  following:   Height as of 08/27/24: 5' 3 (1.6 m).   Weight as of this encounter: 102.5 kg.   Consultants: nephrology.  Procedures performed: none.   Disposition: Home Diet recommendation:  Discharge Diet Orders (From admission, onward)     Start     Ordered   09/18/24 0000  Diet - low sodium heart healthy        09/18/24 1128           Renal diet DISCHARGE MEDICATION: Allergies as of 09/18/2024       Reactions   Hydralazine  Other (See Comments)   Hydralazine  Hcl Other (See Comments)   Hair loss   Hydrocodone Itching, Other (See Comments)   Upset stomach   Metformin  And Related Nausea And Vomiting, Other (See Comments)   Stomach pains, also   Other Nausea Only, Other (See Comments)   Lettuce- Does not digest this!!   Plaquenil [hydroxychloroquine Sulfate] Hives   Shellfish Allergy Nausea And Vomiting   Shellfish-derived Products Nausea Only, Other (See Comments)   Caused an upset stomach   Shrimp (diagnostic) Nausea Only, Other (See Comments)   Upset stomach    Sulfa Antibiotics Hives   Sulfur  Hives        Medication List     STOP taking these medications    oxyCODONE -acetaminophen  5-325 MG tablet Commonly known as: Percocet   polyethylene glycol powder 17 GM/SCOOP powder Commonly known as: GLYCOLAX /MIRALAX    prochlorperazine  10 MG tablet Commonly known as: COMPAZINE        TAKE  these medications    acetaminophen  500 MG tablet Commonly known as: TYLENOL  Take 1,000 mg by mouth every 8 (eight) hours as needed for mild pain (pain score 1-3), moderate pain (pain score 4-6) or headache.   albuterol  108 (90 Base) MCG/ACT inhaler Commonly known as: VENTOLIN  HFA Inhale 2 puffs into the lungs every 6 (six) hours as needed. What changed: reasons to take this   ALPRAZolam  0.25 MG tablet Commonly known as: XANAX  Take 1 tablet (0.25 mg total) by mouth on Monday, Wednesday and Friday at hemodialysis.   amLODipine  10 MG tablet Commonly known as:  NORVASC  Take 1 tablet (10 mg total) by mouth daily for hypertension   Aspirin  Low Dose 81 MG tablet Generic drug: aspirin  EC Take 1 tablet (81 mg total) by mouth daily.   atorvastatin  40 MG tablet Commonly known as: LIPITOR Take 1 tablet (40 mg total) by mouth daily for cholesterol.   Baqsimi  One Pack 3 MG/DOSE Powd Generic drug: Glucagon  Place 1 Device into the nose as needed (Low blood sugar with impaired consciousness).   ezetimibe  10 MG tablet Commonly known as: ZETIA  Take 1 tablet (10 mg total) by mouth daily for cholesterol.   FreeStyle Libre 3 Sensor Misc by Does not apply route.   FreeStyle Libre 3 Plus Sensor Misc Change sensor every 15 days.   isosorbide  mononitrate 30 MG 24 hr tablet Commonly known as: IMDUR  Take 2 tablets (60 mg total) by mouth daily for blood pressure.   labetalol  300 MG tablet Commonly known as: NORMODYNE  Take 1 tablet (300 mg total) by mouth 2 (two) times daily. What changed:  when to take this additional instructions   Lantus  SoloStar 100 UNIT/ML Solostar Pen Generic drug: insulin  glargine Inject 5 Units into the skin 2 (two) times daily. Increased by Provider on 6/24   levothyroxine  25 MCG tablet Commonly known as: SYNTHROID  Take 1 tablet (25 mcg total) by mouth daily for low thyroid  hormone.   metoCLOPramide  5 MG tablet Commonly known as: Reglan  Take 1 tablet (5 mg total) by mouth every 6 (six) hours as needed for nausea.   omeprazole  20 MG capsule Commonly known as: PRILOSEC Take 2 capsules (40 mg total) by mouth daily for acid reflux.   ondansetron  4 MG tablet Commonly known as: ZOFRAN  Take 1 tablet (4 mg total) by mouth every 8 (eight) hours as needed for nausea.   sevelamer  carbonate 800 MG tablet Commonly known as: RENVELA  Take 3 tablets (2,400 mg total) by mouth 3 (three) times daily with meals and 1 tablet with snacks.   sorbitol  70 % Soln Take 30 mLs by mouth 2 (two) times daily.        Discharge  Exam: Filed Weights   09/15/24 1730 09/17/24 0756 09/17/24 1107  Weight: (P) 105.6 kg 104.3 kg 102.5 kg   General exam: Appears calm and comfortable  Respiratory system: Clear to auscultation. Respiratory effort normal. Cardiovascular system: S1 & S2 heard, RRR.  Gastrointestinal system: Abdomen is nondistended, soft and nontender.  Central nervous system: Alert and oriented. Extremities: bilateral BKA Skin: No rashes,  Psychiatry: Mood & affect appropriate.    Condition at discharge: fair  The results of significant diagnostics from this hospitalization (including imaging, microbiology, ancillary and laboratory) are listed below for reference.   Imaging Studies: DG Chest Portable 1 View Result Date: 09/14/2024 CLINICAL DATA:  ESRD on HD, n/v and weakness EXAM: PORTABLE CHEST - 1 VIEW COMPARISON:  08/25/2024 FINDINGS: Left IJ approach hemodialysis catheter, which terminates in  the right atrium, unchanged. No focal airspace consolidation, pleural effusion, or pneumothorax. No cardiomegaly.No acute fracture or destructive lesion. IMPRESSION: No acute cardiopulmonary abnormality. Electronically Signed   By: Rogelia Myers M.D.   On: 09/14/2024 13:27   CT ABDOMEN PELVIS WO CONTRAST Result Date: 09/14/2024 CLINICAL DATA:  Abdominal pain EXAM: CT ABDOMEN AND PELVIS WITHOUT CONTRAST TECHNIQUE: Multidetector CT imaging of the abdomen and pelvis was performed following the standard protocol without IV contrast. RADIATION DOSE REDUCTION: This exam was performed according to the departmental dose-optimization program which includes automated exposure control, adjustment of the mA and/or kV according to patient size and/or use of iterative reconstruction technique. COMPARISON:  CT of the abdomen pelvis performed August 23, 2024 FINDINGS: Lower chest: No acute abnormality. Hepatobiliary: Postsurgical changes from cholecystectomy. Pancreas: Unremarkable. No pancreatic ductal dilatation or surrounding  inflammatory changes. Spleen: Normal in size without focal abnormality. Adrenals/Urinary Tract: Adrenal glands are unchanged. No hydronephrosis. Urinary bladder is decompressed. Stomach/Bowel: No dilated loops of bowel are appreciated. The appendix is well seen and within normal limits. Moderate volume of well-formed stool material is present in the colon. Vascular/Lymphatic: Mild atherosclerotic changes in the abdominal aorta. No abdominal aortic aneurysm. No significant lymphadenopathy. Reproductive: Status post hysterectomy. No adnexal masses. Other: Fat containing umbilical hernia. Musculoskeletal: Nothing significant. IMPRESSION: 1. No CT evidence of acute process. No evidence of appendicitis or diverticulitis. Electronically Signed   By: Maude Naegeli M.D.   On: 09/14/2024 13:22   DG Chest Portable 1 View Result Date: 08/25/2024 CLINICAL DATA:  Generalized abdominal pain EXAM: PORTABLE CHEST 1 VIEW COMPARISON:  07/24/2023 FINDINGS: Left dialysis catheter remains in place, unchanged. Heart and mediastinal contours within normal limits. Small left pleural effusion versus pleural thickening. No confluent airspace opacities, effusions or edema. No acute bony abnormality. IMPRESSION: Small left pleural effusion versus pleural thickening laterally. Electronically Signed   By: Franky Crease M.D.   On: 08/25/2024 21:37   CT ABDOMEN PELVIS WO CONTRAST Result Date: 08/23/2024 CLINICAL DATA:  Right lower quadrant pain EXAM: CT ABDOMEN AND PELVIS WITHOUT CONTRAST TECHNIQUE: Multidetector CT imaging of the abdomen and pelvis was performed following the standard protocol without IV contrast. RADIATION DOSE REDUCTION: This exam was performed according to the departmental dose-optimization program which includes automated exposure control, adjustment of the mA and/or kV according to patient size and/or use of iterative reconstruction technique. COMPARISON:  08/10/2024 FINDINGS: Lower chest: No acute abnormality.  Hepatobiliary: No focal liver abnormality is seen. Status post cholecystectomy. No biliary dilatation. Pancreas: No focal abnormality or ductal dilatation. Spleen: No focal abnormality.  Normal size. Adrenals/Urinary Tract: No adrenal abnormality. No focal renal abnormality. No stones or hydronephrosis. Urinary bladder is unremarkable. Nonspecific bilateral perinephric stranding, similar to prior study compatible with prior insult. Stomach/Bowel: Stomach, large and small bowel grossly unremarkable. Normal appendix. Vascular/Lymphatic: No evidence of aneurysm or adenopathy. Scattered calcifications. Reproductive: Prior hysterectomy.  No adnexal masses. Other: No free fluid or free air. Musculoskeletal: No acute bony abnormality. IMPRESSION: No acute findings in the abdomen or pelvis. Scattered aortic atherosclerosis. Electronically Signed   By: Franky Crease M.D.   On: 08/23/2024 00:53    Microbiology: Results for orders placed or performed during the hospital encounter of 09/14/24  Resp panel by RT-PCR (RSV, Flu A&B, Covid) Anterior Nasal Swab     Status: None   Collection Time: 09/15/24 12:35 AM   Specimen: Anterior Nasal Swab  Result Value Ref Range Status   SARS Coronavirus 2 by RT PCR NEGATIVE NEGATIVE Final  Influenza A by PCR NEGATIVE NEGATIVE Final   Influenza B by PCR NEGATIVE NEGATIVE Final    Comment: (NOTE) The Xpert Xpress SARS-CoV-2/FLU/RSV plus assay is intended as an aid in the diagnosis of influenza from Nasopharyngeal swab specimens and should not be used as a sole basis for treatment. Nasal washings and aspirates are unacceptable for Xpert Xpress SARS-CoV-2/FLU/RSV testing.  Fact Sheet for Patients: BloggerCourse.com  Fact Sheet for Healthcare Providers: SeriousBroker.it  This test is not yet approved or cleared by the [place]  FDA and has been authorized for detection and/or diagnosis of SARS-CoV-2 by FDA under an  Emergency Use Authorization (EUA). This EUA will remain in effect (meaning this test can be used) for the duration of the COVID-19 declaration under Section 564(b)(1) of the Act, 21 U.S.C. section 360bbb-3(b)(1), unless the authorization is terminated or revoked.     Resp Syncytial Virus by PCR NEGATIVE NEGATIVE Final    Comment: (NOTE) Fact Sheet for Patients: BloggerCourse.com  Fact Sheet for Healthcare Providers: SeriousBroker.it  This test is not yet approved or cleared by the [place]  FDA and has been authorized for detection and/or diagnosis of SARS-CoV-2 by FDA under an Emergency Use Authorization (EUA). This EUA will remain in effect (meaning this test can be used) for the duration of the COVID-19 declaration under Section 564(b)(1) of the Act, 21 U.S.C. section 360bbb-3(b)(1), unless the authorization is terminated or revoked.  Performed at [facility], [address] [place], [place] [address]     Labs: CBC: Recent Labs  Lab 09/14/24 1047 09/15/24 0206 09/17/24 0808  WBC 13.1* 15.9* 10.7*  NEUTROABS 11.0*  --   --   HGB 12.0 10.5* 10.0*  HCT 37.7 32.7* 30.8*  MCV 93.1 90.1 91.4  PLT 229 264 248   Basic Metabolic Panel: Recent Labs  Lab 09/14/24 1047 09/15/24 0206 09/17/24 0808  NA 137 136 132*  K 5.4* 4.6 4.0  CL 94* 98 92*  CO2 20* 21* 22  GLUCOSE 193* 182* 116*  BUN 32* 35* 35*  CREATININE 6.14* 6.66* 6.52*  CALCIUM  9.6 8.7* 8.4*  PHOS  --   --  5.7*   Liver Function Tests: Recent Labs  Lab 09/14/24 1047 09/17/24 0808  AST 18  --   ALT 15  --   ALKPHOS 113  --   BILITOT 0.5  --   PROT 7.4  --   ALBUMIN  4.3 3.3*   CBG: Recent Labs  Lab 09/17/24 1948 09/18/24 0001 09/18/24 0510 09/18/24 0903 09/18/24 1141  GLUCAP 160* 117* 94 105* 118*    Discharge time spent: 36 minutes.   Signed: Jazminn Pomales, MD [facility]

## 2024-09-20 ENCOUNTER — Telehealth: Payer: Self-pay | Admitting: Nephrology

## 2024-09-20 ENCOUNTER — Other Ambulatory Visit: Payer: Self-pay | Admitting: Nephrology

## 2024-09-20 NOTE — Telephone Encounter (Signed)
 Transition of Care Contact from Inpatient Facility   Date of Discharge: 09/18/24 Date of Contact: 09/20/24 Method of contact: phone Talked to patient   Patient contacted to discuss transition of care form recent hospitaliztion. Patient was admitted to [facility] from 9/29 to 09/18/24 with the discharge diagnosis of intractable nausea and vomiting.    Medication changes were reviewed.  Patient will follow up with is outpatient dialysis center 09/21/24.  Other follow up needs include none identified.    Manuelita Labella, PA-C [employer]

## 2024-09-21 ENCOUNTER — Other Ambulatory Visit (HOSPITAL_COMMUNITY): Payer: Self-pay

## 2024-09-21 ENCOUNTER — Telehealth: Payer: Self-pay | Admitting: *Deleted

## 2024-09-21 NOTE — Transitions of Care (Post Inpatient/ED Visit) (Signed)
   09/21/2024  Name: Nancy [name] MRN: [id number] DOB: [birth year]  Today's TOC FU Call Status: Today's TOC FU Call Status:: Unsuccessful Call (1st Attempt) Unsuccessful Call (1st Attempt) Date: 09/21/24  Attempted to reach the patient regarding the most recent Inpatient/ED visit.  Follow Up Plan: Additional outreach attempts will be made to reach the patient to complete the Transitions of Care (Post Inpatient/ED visit) call.   Andrea Dimes RN, BSN [place]  [facility] RN Care Manager [phone]

## 2024-09-22 ENCOUNTER — Telehealth: Payer: Self-pay

## 2024-09-22 ENCOUNTER — Other Ambulatory Visit (HOSPITAL_COMMUNITY): Payer: Self-pay

## 2024-09-22 NOTE — Transitions of Care (Post Inpatient/ED Visit) (Signed)
   09/22/2024  Name: Nancy [name] MRN: [id number] DOB: October 21, 1961  Today's TOC FU Call Status: Today's TOC FU Call Status:: Unsuccessful Call (2nd Attempt) Unsuccessful Call (2nd Attempt) Date: 09/22/24  Attempted to reach the patient regarding the most recent Inpatient/ED visit.  Follow Up Plan: Additional outreach attempts will be made to reach the patient to complete the Transitions of Care (Post Inpatient/ED visit) call.   Alan Ee, RN, BSN, CEN [employer]- Transition of Care Team.  [facility] [phone]

## 2024-09-23 ENCOUNTER — Telehealth: Payer: Self-pay

## 2024-09-23 NOTE — Transitions of Care (Post Inpatient/ED Visit) (Signed)
   09/23/2024  Name: Nancy [name] MRN: [id number] DOB: 07-Nov-1961  Today's TOC FU Call Status: Today's TOC FU Call Status:: Unsuccessful Call (3rd Attempt) Unsuccessful Call (3rd Attempt) Date: 09/23/24  Attempted to reach the patient regarding the most recent Inpatient/ED visit.  Follow Up Plan: No further outreach attempts will be made at this time. We have been unable to contact the patient.  Alan Ee, RN, BSN, CEN [employer]- Transition of Care Team.  [facility] [phone]

## 2024-09-24 ENCOUNTER — Other Ambulatory Visit (HOSPITAL_COMMUNITY): Payer: Self-pay | Admitting: Emergency Medicine

## 2024-09-24 NOTE — Progress Notes (Signed)
 My response to the following coding query: Please clarify the medical condition(s) necessitating the order for SARS,RSV,Flu A&B   Evaluate for viral illness my response to the following coding query:

## 2024-09-24 NOTE — Progress Notes (Unsigned)
 Paramedicine Encounter    Patient ID: Nancy [name], female    DOB: 1961-06-26, 63 y.o.   MRN: [id number]   Complaints Nauseous daily w/ some vomiting but not everyday.  Chronic belly pain   Assessment A&O x 4, skin W&D w/ good color.  Denies chest pain or SOB.  Lung sounds clear.  Compliance with meds Pt. Has been in the hospital so has not taken meds from pill box.    Pill box filled  Filled up the few days that were emptiyinh  Refills needed Aspirin   Meds changes since last visit NONE    Social changes NONE   There were no vitals taken for this visit. Weight yesterday-241lb  Last visit weight-   Trying to get medicaid reinstated  ACTION: {Paramed [phone]}  Mary Sharps, EMT-Paramedic [phone] 09/24/24  Patient Care Team: [name], MD as PCP - General (Internal Medicine) Court Dorn PARAS, MD as PCP - Cardiology (Cardiology) Rolan Ezra RAMAN, MD as PCP - Advanced Heart Failure (Cardiology) Center, [facility]  Patient Active Problem List   Diagnosis Date Noted  . Acute colitis 08/10/2024  . Rectal bleeding 08/10/2024  . Abnormal CT scan, gastrointestinal tract 08/10/2024  . Irritable bowel syndrome with constipation 06/04/2024  . S/P bilateral BKA (below knee amputation) (HCC) 02/04/2024  . History of CVA (cerebrovascular accident) 03/22/2023  . Community acquired pneumonia 03/22/2023  . Pain, unspecified 08/15/2022  . Complication of vascular dialysis catheter 08/07/2022  . Chest pain 05/30/2022  . Fluid overload 05/30/2022  . Iron  deficiency anemia, unspecified 04/26/2022  . Nausea 04/10/2022  . Hyponatremia 04/04/2022  . Prolonged QT interval 04/04/2022  . Anaphylactic shock, unspecified, initial encounter 02/23/2022  . Other allergy, initial encounter 02/23/2022  . Pressure injury of skin 02/19/2022  . Anemia in chronic kidney disease 02/16/2022  . Dependence on renal dialysis 02/16/2022  . Hyperlipidemia, unspecified  02/16/2022  . Hypertensive heart and chronic kidney disease with heart failure and with stage 5 chronic kidney disease, or end stage renal disease (HCC) 02/16/2022  . Personal history of nicotine dependence 02/16/2022  . Personal history of transient ischemic attack (TIA), and cerebral infarction without residual deficits 02/16/2022  . Secondary hyperparathyroidism of renal origin 02/16/2022  . Renal anasarca 02/14/2022  . ESRD on dialysis (HCC) 02/14/2022  . Hyperkalemia 02/14/2022  . S/P arteriovenous (AV) fistula creation 12/22/2021  . Peripheral artery disease 10/27/2021  . Acute exacerbation of congestive heart failure (HCC) 10/24/2021  . Acute cardiogenic pulmonary edema (HCC) 10/06/2021  . CAD (coronary artery disease) 10/06/2021  . Goals of care, counseling/discussion   . Gangrene of left foot (HCC)   . Cellulitis of left foot   . CHF (congestive heart failure) (HCC) 07/07/2021  . Acute postoperative anemia due to expected blood loss superimposed on anemia of chronic renal insufficiency 06/30/2021  . CKD (chronic kidney disease), stage IV (HCC) 06/11/2021  . Chronic diastolic CHF (congestive heart failure) (HCC) 06/10/2021  . Abnormal nuclear stress test   . Dyspnea on exertion 03/07/2021  . Orthopnea 02/25/2021  . History of cerebrovascular accident (CVA) with residual deficit 05/06/2020  . AKI (acute kidney injury) 04/20/2020  . Generalized weakness 04/20/2020  . Dehydration 04/20/2020  . Generalized abdominal pain   . Pancreatitis, acute 02/29/2020  . Cerebrovascular accident (CVA) (HCC) 11/08/2019  . Stenosis of right carotid artery 11/08/2019  . Cortical age-related cataract of both eyes 08/30/2019  . Gait abnormality 08/30/2019  . Statin declined 08/30/2019  . Gastroesophageal reflux disease without  esophagitis 04/18/2018  . 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 Type 2 diabetes mellitus (HCC) 10/29/2017  . History of macular degeneration 10/29/2017  . Chronic cervical pain  10/29/2016  . Myalgia 09/14/2016  . Insomnia 09/14/2016  . Tinea pedis of both feet 08/20/2016  . Macular degeneration 08/17/2016  . Low back pain 09/21/2014  . Hypertensive urgency 09/21/2014  . HTN (hypertension) 09/08/2014  . Intractable nausea and vomiting 09/07/2014  . Thyroid  nodule 08/17/2014  . Morbid obesity (HCC) 08/17/2014  . Hypokalemia 08/06/2014  . Type II diabetes mellitus with neurological manifestations, uncontrolled 08/04/2014    Current Outpatient Medications:  .  acetaminophen  (TYLENOL ) 500 MG tablet, Take 1,000 mg by mouth every 8 (eight) hours as needed for mild pain (pain score 1-3), moderate pain (pain score 4-6) or headache., Disp: , Rfl:  .  albuterol  (VENTOLIN  HFA) 108 (90 Base) MCG/ACT inhaler, Inhale 2 puffs into the lungs every 6 (six) hours as needed. (Patient taking differently: Inhale 2 puffs into the lungs every 6 (six) hours as needed for wheezing or shortness of breath.), Disp: 18 g, Rfl: 3 .  ALPRAZolam  (XANAX ) 0.25 MG tablet, Take 1 tablet (0.25 mg total) by mouth on Monday, Wednesday and Friday at hemodialysis., Disp: 12 tablet, Rfl: 2 .  amLODipine  (NORVASC ) 10 MG tablet, Take 1 tablet (10 mg total) by mouth daily for hypertension, Disp: 90 tablet, Rfl: 1 .  aspirin  81 MG EC tablet, Take 1 tablet (81 mg total) by mouth daily., Disp: 60 tablet, Rfl: 1 .  atorvastatin  (LIPITOR) 40 MG tablet, Take 1 tablet (40 mg total) by mouth daily for cholesterol., Disp: 90 tablet, Rfl: 1 .  ezetimibe  (ZETIA ) 10 MG tablet, Take 1 tablet (10 mg total) by mouth daily for cholesterol., Disp: 90 tablet, Rfl: 1 .  insulin  glargine (LANTUS  SOLOSTAR) 100 UNIT/ML Solostar Pen, Inject 5 Units into the skin 2 (two) times daily. Increased by Provider on 6/24, Disp: 15 mL, Rfl: 2 .  isosorbide  mononitrate (IMDUR ) 30 MG 24 hr tablet, Take 2 tablets (60 mg total) by mouth daily for blood pressure., Disp: 180 tablet, Rfl: 1 .  labetalol  (NORMODYNE ) 300 MG tablet, Take 1 tablet (300  mg total) by mouth 2 (two) times daily. (Patient taking differently: Take 1 tablet (300 mg total) by mouth 2 (two) times daily.), Disp: 180 tablet, Rfl: 1 .  levothyroxine  (SYNTHROID ) 25 MCG tablet, Take 1 tablet (25 mcg total) by mouth daily for low thyroid  hormone., Disp: 90 tablet, Rfl: 1 .  metoCLOPramide  (REGLAN ) 5 MG tablet, Take 1 tablet (5 mg total) by mouth every 6 (six) hours as needed for nausea., Disp: 60 tablet, Rfl: 1 .  omeprazole  (PRILOSEC) 20 MG capsule, Take 2 capsules (40 mg total) by mouth daily for acid reflux., Disp: 180 capsule, Rfl: 0 .  sorbitol  70 % SOLN, Take 30 mLs by mouth 2 (two) times daily., Disp: 473 mL, Rfl: 2 .  Continuous Glucose Sensor (FREESTYLE LIBRE 3 PLUS SENSOR) MISC, Change sensor every 15 days., Disp: 6 each, Rfl: 3 .  Continuous Glucose Sensor (FREESTYLE LIBRE 3 SENSOR) MISC, by Does not apply route., Disp: , Rfl:  .  Glucagon  (BAQSIMI  ONE PACK) 3 MG/DOSE POWD, Place 1 Device into the nose as needed (Low blood sugar with impaired consciousness). (Patient not taking: Reported on 09/24/2024), Disp: 2 each, Rfl: 3 .  ondansetron  (ZOFRAN ) 4 MG tablet, Take 1 tablet (4 mg total) by mouth every 8 (eight) hours as needed for nausea. (Patient not taking:  Reported on 09/24/2024), Disp: 90 tablet, Rfl: 3 .  sevelamer  carbonate (RENVELA ) 800 MG tablet, Take 3 tablets (2,400 mg total) by mouth 3 (three) times daily with meals and 1 tablet with snacks., Disp: 1080 tablet, Rfl: 3 Allergies  Allergen Reactions  . Hydralazine  Other (See Comments)  . Hydralazine  Hcl Other (See Comments)    Hair loss  . Hydrocodone Itching and Other (See Comments)    Upset stomach  . Metformin  And Related Nausea And Vomiting and Other (See Comments)    Stomach pains, also  . Other Nausea Only and Other (See Comments)    Lettuce- Does not digest this!!  . Plaquenil [Hydroxychloroquine Sulfate] Hives  . Shellfish Allergy Nausea And Vomiting  . Shellfish Protein-Containing Drug Products  Nausea Only and Other (See Comments)    Caused an upset stomach  . Shrimp (Diagnostic) Nausea Only and Other (See Comments)    Upset stomach   . Sulfa Antibiotics Hives  . Sulfur  Hives     Social History   Socioeconomic History  . Marital status: Legally Separated    Spouse name: Lamar  . Number of children: 1  . Years of education: some colle  . Highest education level: Not on file  Occupational History  . Occupation: Employed FT as Warden/ranger    Comment: [employer] , NA  Tobacco Use  . Smoking status: Former    Current packs/day: 0.50    Average packs/day: 0.5 packs/day for 44.3 years (22.0 ttl pk-yrs)    Types: Cigarettes    Start date: 12/16/1980    Quit date: 12/16/1974    Passive exposure: Never  . Smokeless tobacco: Never  . Tobacco comments:    Former smoker 11/03/21  Vaping Use  . Vaping status: Never Used  Substance and Sexual Activity  . Alcohol use: No  . Drug use: No  . Sexual activity: Not Currently    Birth control/protection: None  Other Topics Concern  . 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 Difficulty of Paying Living Expenses: Not hard at all  Food Insecurity: No Food Insecurity (09/14/2024)   Hunger Vital Sign   . Worried About Programme researcher, broadcasting/film/video in the Last Year: Never true   . Ran Out of Food in the Last Year: Never true  Transportation Needs: No Transportation Needs (09/14/2024)   PRAPARE - Transportation   . Lack of Transportation (Medical): No   . Lack of Transportation (Non-Medical): No  Physical Activity: Inactive (02/04/2024)   Exercise Vital Sign   . Days of Exercise per Week: 0 days   . Minutes of Exercise per Session: 0 min  Stress: No Stress Concern Present (02/04/2024)   [employer] of Occupational Health - Occupational  Stress Questionnaire   . Feeling of Stress : Not at all  Social Connections: Moderately Integrated (02/04/2024)   Social Connection and Isolation Panel   . Frequency of Communication with Friends and Family: Twice a week   . Frequency of Social Gatherings with Friends and Family: Once a week   . Attends Religious Services: Never   . Active Member of Clubs or Organizations: Yes   . Attends Banker Meetings: Never   . Marital Status: Married  Catering manager Violence: Not At Risk (09/15/2024)  Humiliation, Afraid, Rape, and Kick questionnaire   . Fear of Current or Ex-Partner: No   . Emotionally Abused: No   . Physically Abused: No   . Sexually Abused: No    Physical Exam      Future Appointments  Date Time Provider Department Center  10/06/2024 11:10 AM [name], MD [facility] [address]  12/01/2024  1:00 PM Dartha Ernst, MD [facility] None

## 2024-10-01 ENCOUNTER — Other Ambulatory Visit (HOSPITAL_COMMUNITY): Payer: Self-pay | Admitting: Emergency Medicine

## 2024-10-01 NOTE — Progress Notes (Signed)
 Paramedicine Encounter    Patient ID: Nancy [name], female    DOB: 28-Jul-1961, 63 y.o.   MRN: [id number]   Complaints NONE  Assessment A&O x 4, skin W&D w/ good color.  Denies chest pain or SOB.  Lung sounds clear bilat. No obvious edema  Compliance with meds YES  Pill box filled x 1 week  Refills needed NONE  Meds changes since last visit  NONE   Social changes NONE   BP (!) 140/0 (BP Location: Right Arm, Patient Position: Sitting, Cuff Size: Normal) Comment: Palpated  Pulse 84   Resp 16   SpO2 95%  Weight yesterday- Last visit weight-  ACTION: Home visit completed  Nancy [name] [phone] 10/01/24  Patient Care Team: [name], MD as PCP - General (Internal Medicine) Court Dorn PARAS, MD as PCP - Cardiology (Cardiology) Rolan Ezra RAMAN, MD as PCP - Advanced Heart Failure (Cardiology) Center, [facility]  Patient Active Problem List   Diagnosis Date Noted   Acute colitis 08/10/2024   Rectal bleeding 08/10/2024   Abnormal CT scan, gastrointestinal tract 08/10/2024   Irritable bowel syndrome with constipation 06/04/2024   S/P bilateral BKA (below knee amputation) (HCC) 02/04/2024   History of CVA (cerebrovascular accident) 03/22/2023   Community acquired pneumonia 03/22/2023   Pain, unspecified 08/15/2022   Complication of vascular dialysis catheter 08/07/2022   Chest pain 05/30/2022   Fluid overload 05/30/2022   Iron  deficiency anemia, unspecified 04/26/2022   Nausea 04/10/2022   Hyponatremia 04/04/2022   Prolonged QT interval 04/04/2022   Anaphylactic shock, unspecified, initial encounter 02/23/2022   Other allergy, initial encounter 02/23/2022   Pressure injury of skin 02/19/2022   Anemia in chronic kidney disease 02/16/2022   Dependence on renal dialysis 02/16/2022   Hyperlipidemia, unspecified 02/16/2022   Hypertensive heart and chronic kidney disease with heart failure and with stage 5 chronic kidney disease,  or end stage renal disease (HCC) 02/16/2022   Personal history of nicotine dependence 02/16/2022   Personal history of transient ischemic attack (TIA), and cerebral infarction without residual deficits 02/16/2022   Secondary hyperparathyroidism of renal origin 02/16/2022   Renal anasarca 02/14/2022   ESRD on dialysis (HCC) 02/14/2022   Hyperkalemia 02/14/2022   S/P arteriovenous (AV) fistula creation 12/22/2021   Peripheral artery disease 10/27/2021   Acute exacerbation of congestive heart failure (HCC) 10/24/2021   Acute cardiogenic pulmonary edema (HCC) 10/06/2021   CAD (coronary artery disease) 10/06/2021   Goals of care, counseling/discussion    Gangrene of left foot (HCC)    Cellulitis of left foot    CHF (congestive heart failure) (HCC) 07/07/2021   Acute postoperative anemia due to expected blood loss superimposed on anemia of chronic renal insufficiency 06/30/2021   CKD (chronic kidney disease), stage IV (HCC) 06/11/2021   Chronic diastolic CHF (congestive heart failure) (HCC) 06/10/2021   Abnormal nuclear stress test    Dyspnea on exertion 03/07/2021   Orthopnea 02/25/2021   History of cerebrovascular accident (CVA) with residual deficit 05/06/2020   AKI (acute kidney injury) 04/20/2020   Generalized weakness 04/20/2020   Dehydration 04/20/2020   Generalized abdominal pain    Pancreatitis, acute 02/29/2020   Cerebrovascular accident (CVA) (HCC) 11/08/2019   Stenosis of right carotid artery 11/08/2019   Cortical age-related cataract of both eyes 08/30/2019   Gait abnormality 08/30/2019   Statin declined 08/30/2019   Gastroesophageal reflux disease without esophagitis 04/18/2018   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   Type 2 diabetes mellitus (HCC) 10/29/2017  History of macular degeneration 10/29/2017   Chronic cervical pain 10/29/2016   Myalgia 09/14/2016   Insomnia 09/14/2016   Tinea pedis of both feet 08/20/2016   Macular degeneration 08/17/2016   Low back pain 09/21/2014    Hypertensive urgency 09/21/2014   HTN (hypertension) 09/08/2014   Intractable nausea and vomiting 09/07/2014   Thyroid  nodule 08/17/2014   Morbid obesity (HCC) 08/17/2014   Hypokalemia 08/06/2014   Type II diabetes mellitus with neurological manifestations, uncontrolled 08/04/2014    Current Outpatient Medications:    acetaminophen  (TYLENOL ) 500 MG tablet, Take 1,000 mg by mouth every 8 (eight) hours as needed for mild pain (pain score 1-3), moderate pain (pain score 4-6) or headache., Disp: , Rfl:    albuterol  (VENTOLIN  HFA) 108 (90 Base) MCG/ACT inhaler, Inhale 2 puffs into the lungs every 6 (six) hours as needed., Disp: 18 g, Rfl: 3   ALPRAZolam  (XANAX ) 0.25 MG tablet, Take 1 tablet (0.25 mg total) by mouth on Monday, Wednesday and Friday at hemodialysis., Disp: 12 tablet, Rfl: 2   amLODipine  (NORVASC ) 10 MG tablet, Take 1 tablet (10 mg total) by mouth daily for hypertension, Disp: 90 tablet, Rfl: 1   atorvastatin  (LIPITOR) 40 MG tablet, Take 1 tablet (40 mg total) by mouth daily for cholesterol., Disp: 90 tablet, Rfl: 1   ezetimibe  (ZETIA ) 10 MG tablet, Take 1 tablet (10 mg total) by mouth daily for cholesterol., Disp: 90 tablet, Rfl: 1   insulin  glargine (LANTUS  SOLOSTAR) 100 UNIT/ML Solostar Pen, Inject 5 Units into the skin 2 (two) times daily. Increased by Provider on 6/24, Disp: 15 mL, Rfl: 2   isosorbide  mononitrate (IMDUR ) 30 MG 24 hr tablet, Take 2 tablets (60 mg total) by mouth daily for blood pressure., Disp: 180 tablet, Rfl: 1   labetalol  (NORMODYNE ) 300 MG tablet, Take 1 tablet (300 mg total) by mouth 2 (two) times daily. (Patient taking differently: Take 1 tablet (300 mg total) by mouth 2 (two) times daily.), Disp: 180 tablet, Rfl: 1   levothyroxine  (SYNTHROID ) 25 MCG tablet, Take 1 tablet (25 mcg total) by mouth daily for low thyroid  hormone., Disp: 90 tablet, Rfl: 1   metoCLOPramide  (REGLAN ) 5 MG tablet, Take 1 tablet (5 mg total) by mouth every 6 (six) hours as needed for  nausea., Disp: 60 tablet, Rfl: 1   omeprazole  (PRILOSEC) 20 MG capsule, Take 2 capsules (40 mg total) by mouth daily for acid reflux., Disp: 180 capsule, Rfl: 0   sevelamer  carbonate (RENVELA ) 800 MG tablet, Take 3 tablets (2,400 mg total) by mouth 3 (three) times daily with meals and 1 tablet with snacks., Disp: 1080 tablet, Rfl: 3   sorbitol  70 % SOLN, Take 30 mLs by mouth 2 (two) times daily., Disp: 473 mL, Rfl: 2   aspirin  81 MG EC tablet, Take 1 tablet (81 mg total) by mouth daily. (Patient not taking: Reported on 10/01/2024), Disp: 60 tablet, Rfl: 1   Continuous Glucose Sensor (FREESTYLE LIBRE 3 PLUS SENSOR) MISC, Change sensor every 15 days., Disp: 6 each, Rfl: 3   Continuous Glucose Sensor (FREESTYLE LIBRE 3 SENSOR) MISC, by Does not apply route., Disp: , Rfl:    Glucagon  (BAQSIMI  ONE PACK) 3 MG/DOSE POWD, Place 1 Device into the nose as needed (Low blood sugar with impaired consciousness). (Patient not taking: Reported on 10/01/2024), Disp: 2 each, Rfl: 3   ondansetron  (ZOFRAN ) 4 MG tablet, Take 1 tablet (4 mg total) by mouth every 8 (eight) hours as needed for nausea. (Patient not taking:  Reported on 10/01/2024), Disp: 90 tablet, Rfl: 3 Allergies  Allergen Reactions   Hydralazine  Other (See Comments)   Hydralazine  Hcl Other (See Comments)    Hair loss   Hydrocodone Itching and Other (See Comments)    Upset stomach   Metformin  And Related Nausea And Vomiting and Other (See Comments)    Stomach pains, also   Other Nausea Only and Other (See Comments)    Lettuce- Does not digest this!!   Plaquenil [Hydroxychloroquine Sulfate] Hives   Shellfish Allergy Nausea And Vomiting   Shellfish Protein-Containing Drug Products Nausea Only and Other (See Comments)    Caused an upset stomach   Shrimp (Diagnostic) Nausea Only and Other (See Comments)    Upset stomach    Sulfa Antibiotics Hives   Sulfur  Hives     Social History   Socioeconomic History   Marital status: Legally Separated     Spouse name: Lamar   Number of children: 1   Years of education: some colle   Highest education level: Not on file  Occupational History   Occupation: Employed FT as Warden/ranger    Comment: [employer] , NA  Tobacco Use   Smoking status: Former    Current packs/day: 0.50    Average packs/day: 0.5 packs/day for 44.3 years (22.0 ttl pk-yrs)    Types: Cigarettes    Start date: 12/16/1980    Quit date: 12/16/1974    Passive exposure: Never   Smokeless tobacco: Never   Tobacco comments:    Former smoker 11/03/21  Vaping Use   Vaping status: Never Used  Substance and Sexual Activity   Alcohol use: No   Drug use: No   Sexual activity: Not Currently    Birth control/protection: None  Other Topics Concern   Not on file  Social History Narrative   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    Social Drivers of Health   Financial Resource Strain: Low Risk  (02/04/2024)   Overall Financial Resource Strain (CARDIA)    Difficulty of Paying Living Expenses: Not hard at all  Food Insecurity: No Food Insecurity (09/14/2024)   Hunger Vital Sign    Worried About Running Out of Food in the Last Year: Never true    Ran Out of Food in the Last Year: Never true  Transportation Needs: No Transportation Needs (09/14/2024)   PRAPARE - Administrator, Civil Service (Medical): No    Lack of Transportation (Non-Medical): No  Physical Activity: Inactive (02/04/2024)   Exercise Vital Sign    Days of Exercise per Week: 0 days    Minutes of Exercise per Session: 0 min  Stress: No Stress Concern Present (02/04/2024)   [employer] of Occupational Health - Occupational Stress Questionnaire    Feeling of Stress : Not at all  Social Connections: Moderately Integrated (02/04/2024)   Social Connection and Isolation Panel    Frequency of Communication with Friends and Family: Twice a week    Frequency of Social Gatherings with Friends and Family: Once a week     Attends Religious Services: Never    Database administrator or Organizations: Yes    Attends Banker Meetings: Never    Marital Status: Married  Catering manager Violence: Not At Risk (09/15/2024)   Humiliation, Afraid, Rape, and Kick questionnaire    Fear of Current or Ex-Partner: No    Emotionally Abused: No    Physically Abused: No    Sexually Abused:  No    Physical Exam      Future Appointments  Date Time Provider Department Center  10/06/2024 11:10 AM [name], MD [facility] [address]  12/01/2024  1:00 PM Dartha Ernst, MD [facility] None

## 2024-10-06 ENCOUNTER — Ambulatory Visit: Admitting: Internal Medicine

## 2024-10-08 ENCOUNTER — Other Ambulatory Visit (HOSPITAL_COMMUNITY): Payer: Self-pay | Admitting: Emergency Medicine

## 2024-10-08 ENCOUNTER — Other Ambulatory Visit (HOSPITAL_COMMUNITY): Payer: Self-pay

## 2024-10-08 ENCOUNTER — Other Ambulatory Visit: Payer: Self-pay

## 2024-10-08 ENCOUNTER — Other Ambulatory Visit: Payer: Self-pay | Admitting: Internal Medicine

## 2024-10-08 DIAGNOSIS — F411 Generalized anxiety disorder: Secondary | ICD-10-CM

## 2024-10-08 MED ORDER — BRIMONIDINE TARTRATE 0.2 % OP SOLN
1.0000 [drp] | Freq: Two times a day (BID) | OPHTHALMIC | 6 refills | Status: AC
  Filled 2024-10-08: qty 10, 50d supply, fill #0
  Filled 2024-11-23 – 2024-12-17 (×3): qty 10, 50d supply, fill #1

## 2024-10-08 MED ORDER — TIMOLOL MALEATE 0.5 % OP SOLN
1.0000 [drp] | Freq: Two times a day (BID) | OPHTHALMIC | 6 refills | Status: AC
  Filled 2024-10-08: qty 10, 50d supply, fill #0
  Filled 2024-11-23 – 2024-12-17 (×3): qty 10, 50d supply, fill #1

## 2024-10-08 NOTE — Progress Notes (Signed)
 Paramedicine Encounter    Patient ID: Nancy [name], female    DOB: [birth year], 63 y.o.   MRN: [id number]   Complaints NONE  Assessment A&O x 4, skin W&D w/ good color.  Denies chest pain or SOB.  Lung sounds clear bilat. No edema noted  Compliance with meds YES  Pill box filled x 1 week  Refills needed Xanax - called in by Mr. Lukas  Meds changes since last visit NONE    Social changes NONE   BP (!) 160/100 (BP Location: Right Arm, Patient Position: Sitting, Cuff Size: Normal)   Pulse 88   Resp 16   SpO2 97%   Pt has eye dr appt today @ 1:00.  Dr. Octavia.  Provided Nancy [name] w/ address and phone number of this office.  ACTION: Home visit completed  [name] [phone] 10/08/24  Patient Care Team: [name], MD as PCP - General (Internal Medicine) Court Dorn PARAS, MD as PCP - Cardiology (Cardiology) Rolan Ezra RAMAN, MD as PCP - Advanced Heart Failure (Cardiology) [facility]  Patient Active Problem List   Diagnosis Date Noted   Acute colitis 08/10/2024   Rectal bleeding 08/10/2024   Abnormal CT scan, gastrointestinal tract 08/10/2024   Irritable bowel syndrome with constipation 06/04/2024   S/P bilateral BKA (below knee amputation) (HCC) 02/04/2024   History of CVA (cerebrovascular accident) 03/22/2023   Community acquired pneumonia 03/22/2023   Pain, unspecified 08/15/2022   Complication of vascular dialysis catheter 08/07/2022   Chest pain 05/30/2022   Fluid overload 05/30/2022   Iron  deficiency anemia, unspecified 04/26/2022   Nausea 04/10/2022   Hyponatremia 04/04/2022   Prolonged QT interval 04/04/2022   Anaphylactic shock, unspecified, initial encounter 02/23/2022   Other allergy, initial encounter 02/23/2022   Pressure injury of skin 02/19/2022   Anemia in chronic kidney disease 02/16/2022   Dependence on renal dialysis 02/16/2022   Hyperlipidemia, unspecified 02/16/2022   Hypertensive heart  and chronic kidney disease with heart failure and with stage 5 chronic kidney disease, or end stage renal disease (HCC) 02/16/2022   Personal history of nicotine dependence 02/16/2022   Personal history of transient ischemic attack (TIA), and cerebral infarction without residual deficits 02/16/2022   Secondary hyperparathyroidism of renal origin 02/16/2022   Renal anasarca 02/14/2022   ESRD on dialysis (HCC) 02/14/2022   Hyperkalemia 02/14/2022   S/P arteriovenous (AV) fistula creation 12/22/2021   Peripheral artery disease 10/27/2021   Acute exacerbation of congestive heart failure (HCC) 10/24/2021   Acute cardiogenic pulmonary edema (HCC) 10/06/2021   CAD (coronary artery disease) 10/06/2021   Goals of care, counseling/discussion    Gangrene of left foot (HCC)    Cellulitis of left foot    CHF (congestive heart failure) (HCC) 07/07/2021   Acute postoperative anemia due to expected blood loss superimposed on anemia of chronic renal insufficiency 06/30/2021   CKD (chronic kidney disease), stage IV (HCC) 06/11/2021   Chronic diastolic CHF (congestive heart failure) (HCC) 06/10/2021   Abnormal nuclear stress test    Dyspnea on exertion 03/07/2021   Orthopnea 02/25/2021   History of cerebrovascular accident (CVA) with residual deficit 05/06/2020   AKI (acute kidney injury) 04/20/2020   Generalized weakness 04/20/2020   Dehydration 04/20/2020   Generalized abdominal pain    Pancreatitis, acute 02/29/2020   Cerebrovascular accident (CVA) (HCC) 11/08/2019   Stenosis of right carotid artery 11/08/2019   Cortical age-related cataract of both eyes 08/30/2019   Gait abnormality 08/30/2019   Statin declined 08/30/2019  Gastroesophageal reflux disease without esophagitis 04/18/2018   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   Type 2 diabetes mellitus (HCC) 10/29/2017   History of macular degeneration 10/29/2017   Chronic cervical pain 10/29/2016   Myalgia 09/14/2016   Insomnia 09/14/2016   Tinea pedis  of both feet 08/20/2016   Macular degeneration 08/17/2016   Low back pain 09/21/2014   Hypertensive urgency 09/21/2014   HTN (hypertension) 09/08/2014   Intractable nausea and vomiting 09/07/2014   Thyroid  nodule 08/17/2014   Morbid obesity (HCC) 08/17/2014   Hypokalemia 08/06/2014   Type II diabetes mellitus with neurological manifestations, uncontrolled 08/04/2014    Current Outpatient Medications:    acetaminophen  (TYLENOL ) 500 MG tablet, Take 1,000 mg by mouth every 8 (eight) hours as needed for mild pain (pain score 1-3), moderate pain (pain score 4-6) or headache., Disp: , Rfl:    albuterol  (VENTOLIN  HFA) 108 (90 Base) MCG/ACT inhaler, Inhale 2 puffs into the lungs every 6 (six) hours as needed., Disp: 18 g, Rfl: 3   ALPRAZolam  (XANAX ) 0.25 MG tablet, Take 1 tablet (0.25 mg total) by mouth on Monday, Wednesday and Friday at hemodialysis., Disp: 12 tablet, Rfl: 2   amLODipine  (NORVASC ) 10 MG tablet, Take 1 tablet (10 mg total) by mouth daily for hypertension, Disp: 90 tablet, Rfl: 1   aspirin  81 MG EC tablet, Take 1 tablet (81 mg total) by mouth daily., Disp: 60 tablet, Rfl: 1   atorvastatin  (LIPITOR) 40 MG tablet, Take 1 tablet (40 mg total) by mouth daily for cholesterol., Disp: 90 tablet, Rfl: 1   ezetimibe  (ZETIA ) 10 MG tablet, Take 1 tablet (10 mg total) by mouth daily for cholesterol., Disp: 90 tablet, Rfl: 1   insulin  glargine (LANTUS  SOLOSTAR) 100 UNIT/ML Solostar Pen, Inject 5 Units into the skin 2 (two) times daily. Increased by Provider on 6/24, Disp: 15 mL, Rfl: 2   isosorbide  mononitrate (IMDUR ) 30 MG 24 hr tablet, Take 2 tablets (60 mg total) by mouth daily for blood pressure., Disp: 180 tablet, Rfl: 1   labetalol  (NORMODYNE ) 300 MG tablet, Take 1 tablet (300 mg total) by mouth 2 (two) times daily. (Patient taking differently: Take 1 tablet (300 mg total) by mouth 2 (two) times daily.), Disp: 180 tablet, Rfl: 1   levothyroxine  (SYNTHROID ) 25 MCG tablet, Take 1 tablet (25 mcg  total) by mouth daily for low thyroid  hormone., Disp: 90 tablet, Rfl: 1   omeprazole  (PRILOSEC) 20 MG capsule, Take 2 capsules (40 mg total) by mouth daily for acid reflux., Disp: 180 capsule, Rfl: 0   sevelamer  carbonate (RENVELA ) 800 MG tablet, Take 3 tablets (2,400 mg total) by mouth 3 (three) times daily with meals and 1 tablet with snacks., Disp: 1080 tablet, Rfl: 3   Continuous Glucose Sensor (FREESTYLE LIBRE 3 PLUS SENSOR) MISC, Change sensor every 15 days. (Patient not taking: Reported on 10/08/2024), Disp: 6 each, Rfl: 3   Continuous Glucose Sensor (FREESTYLE LIBRE 3 SENSOR) MISC, by Does not apply route. (Patient not taking: Reported on 10/08/2024), Disp: , Rfl:    Glucagon  (BAQSIMI  ONE PACK) 3 MG/DOSE POWD, Place 1 Device into the nose as needed (Low blood sugar with impaired consciousness). (Patient not taking: Reported on 10/08/2024), Disp: 2 each, Rfl: 3   metoCLOPramide  (REGLAN ) 5 MG tablet, Take 1 tablet (5 mg total) by mouth every 6 (six) hours as needed for nausea., Disp: 60 tablet, Rfl: 1   ondansetron  (ZOFRAN ) 4 MG tablet, Take 1 tablet (4 mg total) by mouth every 8 (eight) hours  as needed for nausea. (Patient not taking: Reported on 10/08/2024), Disp: 90 tablet, Rfl: 3   sorbitol  70 % SOLN, Take 30 mLs by mouth 2 (two) times daily. (Patient not taking: Reported on 10/08/2024), Disp: 473 mL, Rfl: 2 Allergies  Allergen Reactions   Hydralazine  Other (See Comments)   Hydralazine  Hcl Other (See Comments)    Hair loss   Hydrocodone Itching and Other (See Comments)    Upset stomach   Metformin  And Related Nausea And Vomiting and Other (See Comments)    Stomach pains, also   Other Nausea Only and Other (See Comments)    Lettuce- Does not digest this!!   Plaquenil [Hydroxychloroquine Sulfate] Hives   Shellfish Allergy Nausea And Vomiting   Shellfish Protein-Containing Drug Products Nausea Only and Other (See Comments)    Caused an upset stomach   Shrimp (Diagnostic) Nausea Only  and Other (See Comments)    Upset stomach    Sulfa Antibiotics Hives   Sulfur  Hives     Social History   Socioeconomic History   Marital status: Legally Separated    Spouse name: Lamar   Number of children: 1   Years of education: some colle   Highest education level: Not on file  Occupational History   Occupation: Employed FT as warden/ranger    Comment: [employer] , NA  Tobacco Use   Smoking status: Former    Current packs/day: 0.50    Average packs/day: 0.5 packs/day for 44.3 years (22.0 ttl pk-yrs)    Types: Cigarettes    Start date: 12/16/1980    Quit date: 12/16/1974    Passive exposure: Never   Smokeless tobacco: Never   Tobacco comments:    Former smoker 11/03/21  Vaping Use   Vaping status: Never Used  Substance and Sexual Activity   Alcohol use: No   Drug use: No   Sexual activity: Not Currently    Birth control/protection: None  Other Topics Concern   Not on file  Social History Narrative   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    Social Drivers of Health   Financial Resource Strain: Low Risk  (02/04/2024)   Overall Financial Resource Strain (CARDIA)    Difficulty of Paying Living Expenses: Not hard at all  Food Insecurity: No Food Insecurity (09/14/2024)   Hunger Vital Sign    Worried About Running Out of Food in the Last Year: Never true    Ran Out of Food in the Last Year: Never true  Transportation Needs: No Transportation Needs (09/14/2024)   PRAPARE - Administrator, Civil Service (Medical): No    Lack of Transportation (Non-Medical): No  Physical Activity: Inactive (02/04/2024)   Exercise Vital Sign    Days of Exercise per Week: 0 days    Minutes of Exercise per Session: 0 min  Stress: No Stress Concern Present (02/04/2024)   [employer] of Occupational Health - Occupational Stress Questionnaire    Feeling of Stress : Not at all  Social Connections: Moderately Integrated (02/04/2024)   Social  Connection and Isolation Panel    Frequency of Communication with Friends and Family: Twice a week    Frequency of Social Gatherings with Friends and Family: Once a week    Attends Religious Services: Never    Database Administrator or Organizations: Yes    Attends Banker Meetings: Never    Marital Status: Married  Catering Manager Violence: Not At Risk (09/15/2024)  Humiliation, Afraid, Rape, and Kick questionnaire    Fear of Current or Ex-Partner: No    Emotionally Abused: No    Physically Abused: No    Sexually Abused: No    Physical Exam      Future Appointments  Date Time Provider Department Center  11/19/2024  1:50 PM [name], MD [facility] [address]  12/01/2024  1:00 PM Dartha Ernst, MD [facility] None

## 2024-10-09 ENCOUNTER — Encounter (INDEPENDENT_AMBULATORY_CARE_PROVIDER_SITE_OTHER): Admitting: Ophthalmology

## 2024-10-09 NOTE — Telephone Encounter (Signed)
 Requested medications are due for refill today.   Too soon  Requested medications are on the active medications list.  yes  Last refill. 06/10/2024 #12 2 rf  Future visit scheduled.   yes  Notes to clinic.  Refill not delegated.    Requested Prescriptions  Pending Prescriptions Disp Refills   ALPRAZolam  (XANAX ) 0.25 MG tablet 12 tablet 2    Sig: Take 1 tablet (0.25 mg total) by mouth on Monday, Wednesday and Friday at hemodialysis.     Not Delegated - Psychiatry: Anxiolytics/Hypnotics 2 Failed - 10/09/2024  3:28 PM      Failed - This refill cannot be delegated      Failed - Urine Drug Screen completed in last 360 days      Passed - Patient is not pregnant      Passed - Valid encounter within last 6 months    Recent Outpatient Visits           1 month ago Left upper quadrant abdominal pain   [facility], [place], MD   4 months ago Type 2 diabetes mellitus with other circulatory complication, with long-term current use of insulin  [facility])   [facility] [place] Vicci Sober B, MD   8 months ago Type 2 diabetes mellitus with other circulatory complication, with long-term current use of insulin  [facility])   [facility] [place]. [facility] Vicci Sober B, MD   10 months ago Chronic respiratory failure with hypoxia, on home O2 therapy [facility])   [facility] [place]. [facility] Vicci Sober B, MD   1 year ago Type 2 diabetes mellitus without complication, with long-term current use of insulin  [facility])   [facility] [place]. [facility] [place], [place], PA-C

## 2024-10-10 ENCOUNTER — Other Ambulatory Visit: Payer: Self-pay | Admitting: Internal Medicine

## 2024-10-10 DIAGNOSIS — F411 Generalized anxiety disorder: Secondary | ICD-10-CM

## 2024-10-10 MED ORDER — ALPRAZOLAM 0.25 MG PO TABS
0.2500 mg | ORAL_TABLET | ORAL | 2 refills | Status: AC | PRN
  Filled 2024-10-10: qty 12, 28d supply, fill #0
  Filled 2024-11-04 – 2024-11-09 (×2): qty 12, 28d supply, fill #1
  Filled 2025-01-15 (×2): qty 12, 28d supply, fill #2

## 2024-10-11 ENCOUNTER — Other Ambulatory Visit: Payer: Self-pay

## 2024-10-11 ENCOUNTER — Encounter (HOSPITAL_COMMUNITY): Payer: Self-pay

## 2024-10-11 ENCOUNTER — Emergency Department (HOSPITAL_COMMUNITY)

## 2024-10-11 ENCOUNTER — Emergency Department (HOSPITAL_COMMUNITY)
Admission: EM | Admit: 2024-10-11 | Discharge: 2024-10-11 | Disposition: A | Discharge status: Home / Self Care | Attending: Emergency Medicine | Admitting: Emergency Medicine

## 2024-10-11 DIAGNOSIS — K3184 Gastroparesis: Secondary | ICD-10-CM | POA: Insufficient documentation

## 2024-10-11 DIAGNOSIS — Z992 Dependence on renal dialysis: Secondary | ICD-10-CM | POA: Diagnosis not present

## 2024-10-11 DIAGNOSIS — Z794 Long term (current) use of insulin: Secondary | ICD-10-CM | POA: Insufficient documentation

## 2024-10-11 DIAGNOSIS — E1122 Type 2 diabetes mellitus with diabetic chronic kidney disease: Secondary | ICD-10-CM | POA: Diagnosis not present

## 2024-10-11 DIAGNOSIS — I120 Hypertensive chronic kidney disease with stage 5 chronic kidney disease or end stage renal disease: Secondary | ICD-10-CM | POA: Insufficient documentation

## 2024-10-11 DIAGNOSIS — R1084 Generalized abdominal pain: Secondary | ICD-10-CM | POA: Diagnosis present

## 2024-10-11 DIAGNOSIS — Z8673 Personal history of transient ischemic attack (TIA), and cerebral infarction without residual deficits: Secondary | ICD-10-CM | POA: Diagnosis not present

## 2024-10-11 DIAGNOSIS — Z79899 Other long term (current) drug therapy: Secondary | ICD-10-CM | POA: Diagnosis not present

## 2024-10-11 DIAGNOSIS — I2510 Atherosclerotic heart disease of native coronary artery without angina pectoris: Secondary | ICD-10-CM | POA: Diagnosis not present

## 2024-10-11 DIAGNOSIS — N186 End stage renal disease: Secondary | ICD-10-CM | POA: Insufficient documentation

## 2024-10-11 DIAGNOSIS — Z7982 Long term (current) use of aspirin: Secondary | ICD-10-CM | POA: Insufficient documentation

## 2024-10-11 LAB — CBC
HCT: 34.1 % — ABNORMAL LOW (ref 36.0–46.0)
Hemoglobin: 10.8 g/dL — ABNORMAL LOW (ref 12.0–15.0)
MCH: 29.9 pg (ref 26.0–34.0)
MCHC: 31.7 g/dL (ref 30.0–36.0)
MCV: 94.5 fL (ref 80.0–100.0)
Platelets: 267 K/uL (ref 150–400)
RBC: 3.61 MIL/uL — ABNORMAL LOW (ref 3.87–5.11)
RDW: 17.3 % — ABNORMAL HIGH (ref 11.5–15.5)
WBC: 13.1 K/uL — ABNORMAL HIGH (ref 4.0–10.5)
nRBC: 0 % (ref 0.0–0.2)

## 2024-10-11 LAB — COMPREHENSIVE METABOLIC PANEL WITH GFR
ALT: 17 U/L (ref 0–44)
AST: 22 U/L (ref 15–41)
Albumin: 3.5 g/dL (ref 3.5–5.0)
Alkaline Phosphatase: 59 U/L (ref 38–126)
Anion gap: 20 — ABNORMAL HIGH (ref 5–15)
BUN: 25 mg/dL — ABNORMAL HIGH (ref 8–23)
CO2: 21 mmol/L — ABNORMAL LOW (ref 22–32)
Calcium: 8.6 mg/dL — ABNORMAL LOW (ref 8.9–10.3)
Chloride: 98 mmol/L (ref 98–111)
Creatinine, Ser: 7.02 mg/dL — ABNORMAL HIGH (ref 0.44–1.00)
GFR, Estimated: 6 mL/min — ABNORMAL LOW (ref >60)
Glucose, Bld: 107 mg/dL — ABNORMAL HIGH (ref 70–99)
Potassium: 3.3 mmol/L — ABNORMAL LOW (ref 3.5–5.1)
Sodium: 139 mmol/L (ref 135–145)
Total Bilirubin: 0.9 mg/dL (ref 0.0–1.2)
Total Protein: 6.5 g/dL (ref 6.5–8.1)

## 2024-10-11 LAB — LIPASE, BLOOD: Lipase: 18 U/L (ref 11–51)

## 2024-10-11 MED ORDER — HYDROMORPHONE HCL 1 MG/ML IJ SOLN
1.0000 mg | Freq: Once | INTRAMUSCULAR | Status: AC
  Administered 2024-10-11: 1 mg via INTRAVENOUS
  Filled 2024-10-11: qty 1

## 2024-10-11 MED ORDER — OXYCODONE-ACETAMINOPHEN 5-325 MG PO TABS
1.0000 | ORAL_TABLET | Freq: Four times a day (QID) | ORAL | 0 refills | Status: DC | PRN
  Filled 2024-10-11: qty 6, 2d supply, fill #0

## 2024-10-11 NOTE — ED Provider Notes (Signed)
 Care was taken over from Dr. Zackowski.  Patient presents with abdominal pain associate with nausea and vomiting.  She has had similar symptoms in the past.  Was recently admitted for same symptoms.  She is feeling better after her treatment by Dr. Zackowski.  Her CT scan does not show any acute abnormality.  Labs are nonconcerning.  Her potassium is actually a little bit on the low side.  She had a C. difficile ordered but she has not had any ongoing diarrhea.  She has not been able to produce a urine sample but she has not been having any urinary symptoms.  She is tolerating oral fluids without ongoing vomiting.  She was discharged home in good condition.  She was encouraged to follow-up with her PCP or gastroenterologist.  She was encouraged to go to dialysis tomorrow.  Return precautions were given.   Lenor Hollering, MD 10/11/24 [phone]

## 2024-10-11 NOTE — Discharge Instructions (Addendum)
 Make sure that you go to dialysis tomorrow.  Follow-up with your primary care doctor.  Return to the emergency room if you have any worsening symptoms.

## 2024-10-11 NOTE — ED Notes (Signed)
 Pt family came to desk requesting pain medication for pt

## 2024-10-11 NOTE — ED Provider Notes (Addendum)
 [place] EMERGENCY DEPARTMENT AT [facility] Provider Note   CSN: [id number] Arrival date & time: 10/11/24  1736     Patient presents with: Abdominal Pain and Emesis   Nancy [name] is a 63 y.o. female.   Patient is a dialysis patient patient is followed by [facility] gastroenterology.  She has had trouble with gastroparesis on and off.  Patient normally dialyzed Monday Wednesdays and Fridays.  Patient only had partial dialysis on Friday because she had nausea vomiting problems.  Did have complete dialysis on Wednesday.  Patient has a dialysis catheter in her right upper chest area.  Patient with recent admission in September 29 through September 3 for intractable nausea and vomiting end-stage renal disease on dialysis type 2 diabetes and hypertensive emergency generalized abdominal pain.  Patient has CT scan done of her abdomen at that time without contrast which was negative.  Patient was seen by [facility] gastroenterology during the hospitalization.  And was to follow-up with them for further evaluation.  Patient states that 4 days ago she started vomiting fairly consistently.  Occasionally has loose bowel movements.  Has generalized abdominal pain with it as well.  Past medical history is significant for CVA coronary artery disease catheterization May 2022 nonobstructive coronary artery disease hypertension peripheral vascular disease bilateral lower extremity amputations.  Right parotid adenoma.  Was admitted in August for GI bleed secondary to probable colitis.  Patient denies any blood in the bowel movements or vomit.       Prior to Admission medications   Medication Sig Start Date End Date Taking? Authorizing Provider  acetaminophen  (TYLENOL ) 500 MG tablet Take 1,000 mg by mouth every 8 (eight) hours as needed for mild pain (pain score 1-3), moderate pain (pain score 4-6) or headache.    [provider]  albuterol  (VENTOLIN  HFA) 108 (90 Base) MCG/ACT inhaler  Inhale 2 puffs into the lungs every 6 (six) hours as needed. 08/21/23   [name], MD  ALPRAZolam  (XANAX ) 0.25 MG tablet Take 1 tablet (0.25 mg total) by mouth on Monday, Wednesday and Friday at hemodialysis. 10/10/24   [name], MD  amLODipine  (NORVASC ) 10 MG tablet Take 1 tablet (10 mg total) by mouth daily for hypertension 06/04/24   [name], MD  aspirin  81 MG EC tablet Take 1 tablet (81 mg total) by mouth daily. 09/26/21   [place], Harlene HERO, FNP  atorvastatin  (LIPITOR) 40 MG tablet Take 1 tablet (40 mg total) by mouth daily for cholesterol. 06/04/24   [name], MD  brimonidine (ALPHAGAN) 0.2 % ophthalmic solution Place 1 drop into both eyes 2 (two) times daily. 10/08/24     Continuous Glucose Sensor (FREESTYLE LIBRE 3 PLUS SENSOR) MISC Change sensor every 15 days. Patient not taking: Reported on 10/08/2024 08/27/24   Dartha Ernst, MD  Continuous Glucose Sensor (FREESTYLE LIBRE 3 SENSOR) MISC by Does not apply route. Patient not taking: Reported on 10/08/2024    [provider]  ezetimibe  (ZETIA ) 10 MG tablet Take 1 tablet (10 mg total) by mouth daily for cholesterol. 06/04/24   [name], MD  Glucagon  (BAQSIMI  ONE PACK) 3 MG/DOSE POWD Place 1 Device into the nose as needed (Low blood sugar with impaired consciousness). Patient not taking: Reported on 10/08/2024 08/27/24   Motwani, Komal, MD  insulin  glargine (LANTUS  SOLOSTAR) 100 UNIT/ML Solostar Pen Inject 5 Units into the skin 2 (two) times daily. Increased by Provider on 6/24 06/16/24   [name], MD  isosorbide  mononitrate (IMDUR ) 30 MG 24 hr tablet Take 2 tablets (60 mg total) by mouth daily for blood pressure. 06/04/24   [name], MD  labetalol  (NORMODYNE ) 300 MG tablet Take 1 tablet (300 mg total) by mouth 2 (two) times daily. Patient taking differently: Take 1 tablet (300 mg total) by mouth 2 (two) times daily. 06/04/24   [name], MD  levothyroxine   (SYNTHROID ) 25 MCG tablet Take 1 tablet (25 mcg total) by mouth daily for low thyroid  hormone. 06/04/24   [name], MD  metoCLOPramide  (REGLAN ) 5 MG tablet Take 1 tablet (5 mg total) by mouth every 6 (six) hours as needed for nausea. 09/18/24 09/18/25  Akula, Vijaya, MD  omeprazole  (PRILOSEC) 20 MG capsule Take 2 capsules (40 mg total) by mouth daily for acid reflux. 07/13/24   [name], MD  ondansetron  (ZOFRAN ) 4 MG tablet Take 1 tablet (4 mg total) by mouth every 8 (eight) hours as needed for nausea. Patient not taking: Reported on 10/08/2024 09/18/24   Akula, Vijaya, MD  sevelamer  carbonate (RENVELA ) 800 MG tablet Take 3 tablets (2,400 mg total) by mouth 3 (three) times daily with meals and 1 tablet with snacks. 09/04/23     sorbitol  70 % SOLN Take 30 mLs by mouth 2 (two) times daily. Patient not taking: Reported on 10/08/2024 09/18/24   Akula, Vijaya, MD  timolol (TIMOPTIC) 0.5 % ophthalmic solution Place 1 drop into both eyes 2 (two) times daily. 10/08/24     Insulin  NPH Isophane & Regular (RELION 70/30 [place]) Inject 35 Units into the skin 2 (two) times daily.  08/17/14  [provider]    Allergies: Hydralazine , Hydralazine  hcl, Hydrocodone, Metformin  and related, Other, Plaquenil [hydroxychloroquine sulfate], Shellfish allergy, Shellfish protein-containing drug products, Shrimp (diagnostic), Sulfa antibiotics, and Sulfur     Review of Systems  Constitutional:  Negative for chills and fever.  HENT:  Negative for ear pain and sore throat.   Eyes:  Negative for pain and visual disturbance.  Respiratory:  Negative for cough and shortness of breath.   Cardiovascular:  Negative for chest pain and palpitations.  Gastrointestinal:  Positive for abdominal pain, diarrhea, nausea and vomiting.  Genitourinary:  Negative for dysuria and hematuria.  Musculoskeletal:  Negative for arthralgias and back pain.  Skin:  Negative for color change and rash.  Neurological:  Negative for  seizures and syncope.  All other systems reviewed and are negative.   Updated Vital Signs BP (!) 152/141   Pulse 94   Temp 98.3 F (36.8 C) (Oral)   Resp 18   Ht 1.6 m (5' 3)   Wt 108.9 kg   SpO2 100%   BMI 42.51 kg/m   Physical Exam Vitals and nursing note reviewed.  Constitutional:      General: She is not in acute distress.    Appearance: Normal appearance. She is well-developed.  HENT:     Head: Normocephalic and atraumatic.  Eyes:     Extraocular Movements: Extraocular movements intact.     Conjunctiva/sclera: Conjunctivae normal.     Pupils: Pupils are equal, round, and reactive to light.  Cardiovascular:     Rate and Rhythm: Normal rate and regular rhythm.     Heart sounds: No murmur heard. Pulmonary:     Effort: Pulmonary effort is normal. No respiratory distress.     Breath sounds: Normal breath sounds.  Abdominal:     Palpations: Abdomen is soft.     Tenderness: There is abdominal tenderness.  Comments: Generalized tenderness  Musculoskeletal:        General: No swelling.     Cervical back: Normal range of motion and neck supple.  Skin:    General: Skin is warm and dry.     Capillary Refill: Capillary refill takes less than 2 seconds.  Neurological:     General: No focal deficit present.     Mental Status: She is alert and oriented to person, place, and time.  Psychiatric:        Mood and Affect: Mood normal.     (all labs ordered are listed, but only abnormal results are displayed) Labs Reviewed  URINALYSIS, ROUTINE W REFLEX MICROSCOPIC  CBC  COMPREHENSIVE METABOLIC PANEL WITH GFR  LIPASE, BLOOD    EKG: EKG Interpretation Date/Time:  Sunday October 11 2024 17:54:10 EDT Ventricular Rate:  90 PR Interval:  163 QRS Duration:  89 QT Interval:  474 QTC Calculation: 581 R Axis:   -8  Text Interpretation: Sinus rhythm LVH by voltage Anterior Q waves, possibly due to LVH Borderline T abnormalities, inferior leads Prolonged QT interval New  since previous tracing Confirmed by Jevaughn Degollado [phone]) on 10/11/2024 6:59:48 PM  Radiology: No results found.   Procedures   Medications Ordered in the ED - No data to display                                  Medical Decision Making Amount and/or Complexity of Data Reviewed Labs: ordered. Radiology: ordered.  Risk Prescription drug management.   Patient will need chest x-ray will get CT abdomen pelvis without.  Patient will need basic labs.  Once IV established we will give her something for pain.  Patient feeling much better after the Zofran  and the pain medicine.  Chest x-ray without any acute findings or fluid overload which is reassuring.  CT scan abdomen pelvis no acute findings in the abdomen or pelvis.  Very similar to the one previously.  Or waiting on his labs.  Will have to turn her over for lab results.   Final diagnoses:  End stage renal disease on dialysis [facility])  Gastroparesis    ED Discharge Orders     None          Geraldene Hamilton, MD 10/11/24 AVA Geraldene Hamilton, MD 10/11/24 2056

## 2024-10-11 NOTE — ED Triage Notes (Signed)
 Pt coming in from home where she has been having nausea and vomiting. Pt is a dialysis pt . On Friday pt had to skip the end of her treatment due to abdominal pain and vomiting. Pt has diffuse mid upper abdominal pain radiating to lower quadrant. \ Ems  160/110 NSR 84 99% on 2l (baseline  Cbg 140

## 2024-10-11 NOTE — ED Notes (Signed)
 Pt having frequent Bms.

## 2024-10-11 NOTE — ED Notes (Signed)
 Pt used bedpan pt had bm along with urine unable to collect urine due to contamination

## 2024-10-12 ENCOUNTER — Other Ambulatory Visit: Payer: Self-pay

## 2024-10-12 ENCOUNTER — Other Ambulatory Visit (HOSPITAL_COMMUNITY): Payer: Self-pay

## 2024-10-13 ENCOUNTER — Ambulatory Visit: Admitting: Nurse Practitioner

## 2024-10-15 ENCOUNTER — Other Ambulatory Visit (HOSPITAL_COMMUNITY): Payer: Self-pay

## 2024-10-15 ENCOUNTER — Other Ambulatory Visit: Payer: Self-pay | Admitting: Internal Medicine

## 2024-10-15 ENCOUNTER — Other Ambulatory Visit (HOSPITAL_COMMUNITY): Payer: Self-pay | Admitting: Emergency Medicine

## 2024-10-15 DIAGNOSIS — E1159 Type 2 diabetes mellitus with other circulatory complications: Secondary | ICD-10-CM

## 2024-10-15 MED ORDER — AMLODIPINE BESYLATE 10 MG PO TABS
10.0000 mg | ORAL_TABLET | Freq: Every day | ORAL | 0 refills | Status: DC
  Filled 2024-10-15: qty 90, 90d supply, fill #0

## 2024-10-15 NOTE — Progress Notes (Unsigned)
 Paramedicine Encounter    Patient ID: Nancy [name], female    DOB: 1961/10/27, 63 y.o.   MRN: [id number]   Complaints NONE  Assessment  Compliance with meds Yes  Pill box filled x 1 week  Refills needed Amlodopine  Meds changes since last visit Added 2 Eyedrops     Social changes NONE   There were no vitals taken for this visit. Weight yesterday-*** Last visit weight-***  ACTION: {Paramed [phone]}  Mary Sharps, EMT-Paramedic [phone] 10/15/24  Patient Care Team: [name], MD as PCP - General (Internal Medicine) Court Dorn PARAS, MD as PCP - Cardiology (Cardiology) Rolan Ezra RAMAN, MD as PCP - Advanced Heart Failure (Cardiology) Center, [facility]  Patient Active Problem List   Diagnosis Date Noted  . Acute colitis 08/10/2024  . Rectal bleeding 08/10/2024  . Abnormal CT scan, gastrointestinal tract 08/10/2024  . Irritable bowel syndrome with constipation 06/04/2024  . S/P bilateral BKA (below knee amputation) (HCC) 02/04/2024  . History of CVA (cerebrovascular accident) 03/22/2023  . Community acquired pneumonia 03/22/2023  . Pain, unspecified 08/15/2022  . Complication of vascular dialysis catheter 08/07/2022  . Chest pain 05/30/2022  . Fluid overload 05/30/2022  . Iron  deficiency anemia, unspecified 04/26/2022  . Nausea 04/10/2022  . Hyponatremia 04/04/2022  . Prolonged QT interval 04/04/2022  . Anaphylactic shock, unspecified, initial encounter 02/23/2022  . Other allergy, initial encounter 02/23/2022  . Pressure injury of skin 02/19/2022  . Anemia in chronic kidney disease 02/16/2022  . Dependence on renal dialysis 02/16/2022  . Hyperlipidemia, unspecified 02/16/2022  . Hypertensive heart and chronic kidney disease with heart failure and with stage 5 chronic kidney disease, or end stage renal disease (HCC) 02/16/2022  . Personal history of nicotine dependence 02/16/2022  . Personal history of transient ischemic  attack (TIA), and cerebral infarction without residual deficits 02/16/2022  . Secondary hyperparathyroidism of renal origin 02/16/2022  . Renal anasarca 02/14/2022  . ESRD on dialysis (HCC) 02/14/2022  . Hyperkalemia 02/14/2022  . S/P arteriovenous (AV) fistula creation 12/22/2021  . Peripheral artery disease 10/27/2021  . Acute exacerbation of congestive heart failure (HCC) 10/24/2021  . Acute cardiogenic pulmonary edema (HCC) 10/06/2021  . CAD (coronary artery disease) 10/06/2021  . Goals of care, counseling/discussion   . Gangrene of left foot (HCC)   . Cellulitis of left foot   . CHF (congestive heart failure) (HCC) 07/07/2021  . Acute postoperative anemia due to expected blood loss superimposed on anemia of chronic renal insufficiency 06/30/2021  . CKD (chronic kidney disease), stage IV (HCC) 06/11/2021  . Chronic diastolic CHF (congestive heart failure) (HCC) 06/10/2021  . Abnormal nuclear stress test   . Dyspnea on exertion 03/07/2021  . Orthopnea 02/25/2021  . History of cerebrovascular accident (CVA) with residual deficit 05/06/2020  . AKI (acute kidney injury) 04/20/2020  . Generalized weakness 04/20/2020  . Dehydration 04/20/2020  . Generalized abdominal pain   . Pancreatitis, acute 02/29/2020  . Cerebrovascular accident (CVA) (HCC) 11/08/2019  . Stenosis of right carotid artery 11/08/2019  . Cortical age-related cataract of both eyes 08/30/2019  . Gait abnormality 08/30/2019  . Statin declined 08/30/2019  . Gastroesophageal reflux disease without esophagitis 04/18/2018  . 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 Type 2 diabetes mellitus (HCC) 10/29/2017  . History of macular degeneration 10/29/2017  . Chronic cervical pain 10/29/2016  . Myalgia 09/14/2016  . Insomnia 09/14/2016  . Tinea pedis of both feet 08/20/2016  . Macular degeneration 08/17/2016  . Low back pain 09/21/2014  .  Hypertensive urgency 09/21/2014  . HTN (hypertension) 09/08/2014  . Intractable nausea and  vomiting 09/07/2014  . Thyroid  nodule 08/17/2014  . Morbid obesity (HCC) 08/17/2014  . Hypokalemia 08/06/2014  . Type II diabetes mellitus with neurological manifestations, uncontrolled 08/04/2014    Current Outpatient Medications:  .  acetaminophen  (TYLENOL ) 500 MG tablet, Take 1,000 mg by mouth every 8 (eight) hours as needed for mild pain (pain score 1-3), moderate pain (pain score 4-6) or headache., Disp: , Rfl:  .  albuterol  (VENTOLIN  HFA) 108 (90 Base) MCG/ACT inhaler, Inhale 2 puffs into the lungs every 6 (six) hours as needed., Disp: 18 g, Rfl: 3 .  ALPRAZolam  (XANAX ) 0.25 MG tablet, Take 1 tablet (0.25 mg total) by mouth on Monday, Wednesday and Friday at hemodialysis., Disp: 12 tablet, Rfl: 2 .  amLODipine  (NORVASC ) 10 MG tablet, Take 1 tablet (10 mg total) by mouth daily for hypertension, Disp: 90 tablet, Rfl: 1 .  aspirin  81 MG EC tablet, Take 1 tablet (81 mg total) by mouth daily., Disp: 60 tablet, Rfl: 1 .  atorvastatin  (LIPITOR) 40 MG tablet, Take 1 tablet (40 mg total) by mouth daily for cholesterol., Disp: 90 tablet, Rfl: 1 .  brimonidine (ALPHAGAN) 0.2 % ophthalmic solution, Place 1 drop into both eyes 2 (two) times daily., Disp: 10 mL, Rfl: 6 .  ezetimibe  (ZETIA ) 10 MG tablet, Take 1 tablet (10 mg total) by mouth daily for cholesterol., Disp: 90 tablet, Rfl: 1 .  insulin  glargine (LANTUS  SOLOSTAR) 100 UNIT/ML Solostar Pen, Inject 5 Units into the skin 2 (two) times daily. Increased by Provider on 6/24, Disp: 15 mL, Rfl: 2 .  isosorbide  mononitrate (IMDUR ) 30 MG 24 hr tablet, Take 2 tablets (60 mg total) by mouth daily for blood pressure., Disp: 180 tablet, Rfl: 1 .  labetalol  (NORMODYNE ) 300 MG tablet, Take 1 tablet (300 mg total) by mouth 2 (two) times daily. (Patient taking differently: Take 1 tablet (300 mg total) by mouth 2 (two) times daily.), Disp: 180 tablet, Rfl: 1 .  levothyroxine  (SYNTHROID ) 25 MCG tablet, Take 1 tablet (25 mcg total) by mouth daily for low thyroid   hormone., Disp: 90 tablet, Rfl: 1 .  metoCLOPramide  (REGLAN ) 5 MG tablet, Take 1 tablet (5 mg total) by mouth every 6 (six) hours as needed for nausea., Disp: 60 tablet, Rfl: 1 .  omeprazole  (PRILOSEC) 20 MG capsule, Take 2 capsules (40 mg total) by mouth daily for acid reflux., Disp: 180 capsule, Rfl: 0 .  ondansetron  (ZOFRAN ) 4 MG tablet, Take 1 tablet (4 mg total) by mouth every 8 (eight) hours as needed for nausea., Disp: 90 tablet, Rfl: 3 .  oxyCODONE -acetaminophen  (PERCOCET/ROXICET) 5-325 MG tablet, Take 1 tablet by mouth every 6 (six) hours as needed for severe pain (pain score 7-10)., Disp: 6 tablet, Rfl: 0 .  sevelamer  carbonate (RENVELA ) 800 MG tablet, Take 3 tablets (2,400 mg total) by mouth 3 (three) times daily with meals and 1 tablet with snacks., Disp: 1080 tablet, Rfl: 3 .  timolol (TIMOPTIC) 0.5 % ophthalmic solution, Place 1 drop into both eyes 2 (two) times daily., Disp: 10 mL, Rfl: 6 .  Continuous Glucose Sensor (FREESTYLE LIBRE 3 PLUS SENSOR) MISC, Change sensor every 15 days. (Patient not taking: Reported on 10/11/2024), Disp: 6 each, Rfl: 3 .  Continuous Glucose Sensor (FREESTYLE LIBRE 3 SENSOR) MISC, by Does not apply route. (Patient not taking: No sig reported), Disp: , Rfl:  .  Glucagon  (BAQSIMI  ONE PACK) 3 MG/DOSE POWD, Place 1  Device into the nose as needed (Low blood sugar with impaired consciousness). (Patient not taking: Reported on 10/15/2024), Disp: 2 each, Rfl: 3 .  sorbitol  70 % SOLN, Take 30 mLs by mouth 2 (two) times daily. (Patient not taking: Reported on 10/15/2024), Disp: 473 mL, Rfl: 2 Allergies  Allergen Reactions  . Hydralazine  Hcl Other (See Comments)    Hair loss  . Hydrocodone Itching and Other (See Comments)    Upset stomach  . Metformin  And Related Nausea And Vomiting and Other (See Comments)    Stomach pains, also  . Other Nausea Only and Other (See Comments)    Lettuce- Does not digest this!!  . Plaquenil [Hydroxychloroquine Sulfate] Hives  .  Shellfish Allergy Nausea And Vomiting  . Shellfish Protein-Containing Drug Products Nausea Only and Other (See Comments)    Caused an upset stomach  . Shrimp (Diagnostic) Nausea Only and Other (See Comments)    Upset stomach   . Sulfa Antibiotics Hives  . Sulfur  Hives     Social History   Socioeconomic History  . Marital status: Legally Separated    Spouse name: Lamar  . Number of children: 1  . Years of education: some colle  . Highest education level: Not on file  Occupational History  . Occupation: Employed FT as warden/ranger    Comment: [employer] , NA  Tobacco Use  . Smoking status: Former    Current packs/day: 0.50    Average packs/day: 0.5 packs/day for 44.3 years (22.0 ttl pk-yrs)    Types: Cigarettes    Start date: 12/16/1980    Quit date: 12/16/1974    Passive exposure: Never  . Smokeless tobacco: Never  . Tobacco comments:    Former smoker 11/03/21  Vaping Use  . Vaping status: Never Used  Substance and Sexual Activity  . Alcohol use: No  . Drug use: No  . Sexual activity: Not Currently    Birth control/protection: None  Other Topics Concern  . 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 Difficulty of Paying Living Expenses: Not hard at all  Food Insecurity: No Food Insecurity (09/14/2024)   Hunger Vital Sign   . Worried About Programme Researcher, Broadcasting/film/video in the Last Year: Never true   . Ran Out of Food in the Last Year: Never true  Transportation Needs: No Transportation Needs (09/14/2024)   PRAPARE - Transportation   . Lack of Transportation (Medical): No   . Lack of Transportation (Non-Medical): No  Physical Activity: Inactive (02/04/2024)   Exercise Vital Sign   . Days of Exercise per Week: 0 days   . Minutes of Exercise per Session: 0 min  Stress: No Stress  Concern Present (02/04/2024)   [employer] of Occupational Health - Occupational Stress Questionnaire   . Feeling of Stress : Not at all  Social Connections: Moderately Integrated (02/04/2024)   Social Connection and Isolation Panel   . Frequency of Communication with Friends and Family: Twice a week   . Frequency of Social Gatherings with Friends and Family: Once a week   . Attends Religious Services: Never   . Active Member of Clubs or Organizations: Yes   . Attends Banker Meetings: Never   . Marital Status: Married  Catering Manager Violence: Not At Risk (  09/15/2024)   Humiliation, Afraid, Rape, and Kick questionnaire   . Fear of Current or Ex-Partner: No   . Emotionally Abused: No   . Physically Abused: No   . Sexually Abused: No    Physical Exam      Future Appointments  Date Time Provider Department Center  10/20/2024  2:40 PM Leavy Lucas Fox, PA-C [facility] [address]  11/19/2024  1:50 PM [name], MD [facility] [address]  12/01/2024  1:00 PM Dartha Ernst, MD [facility] None

## 2024-10-20 ENCOUNTER — Ambulatory Visit

## 2024-10-20 ENCOUNTER — Telehealth (HOSPITAL_COMMUNITY): Payer: Self-pay | Admitting: Emergency Medicine

## 2024-10-20 VITALS — BP 155/86 | HR 93 | Temp 98.1°F | Resp 16 | Ht 63.5 in | Wt 230.2 lb

## 2024-10-20 DIAGNOSIS — Z794 Long term (current) use of insulin: Secondary | ICD-10-CM | POA: Diagnosis not present

## 2024-10-20 DIAGNOSIS — E1169 Type 2 diabetes mellitus with other specified complication: Secondary | ICD-10-CM

## 2024-10-20 DIAGNOSIS — R1012 Left upper quadrant pain: Secondary | ICD-10-CM

## 2024-10-20 DIAGNOSIS — E119 Type 2 diabetes mellitus without complications: Secondary | ICD-10-CM

## 2024-10-20 DIAGNOSIS — Z7902 Long term (current) use of antithrombotics/antiplatelets: Secondary | ICD-10-CM | POA: Diagnosis not present

## 2024-10-20 DIAGNOSIS — Z7982 Long term (current) use of aspirin: Secondary | ICD-10-CM

## 2024-10-20 DIAGNOSIS — Z79899 Other long term (current) drug therapy: Secondary | ICD-10-CM

## 2024-10-20 LAB — POCT GLYCOSYLATED HEMOGLOBIN (HGB A1C): Hemoglobin A1C: 5.7 % — AB (ref 4.0–5.6)

## 2024-10-20 NOTE — Telephone Encounter (Signed)
 Called to advise Nancy [name] that [employer] are ready to picked up @ [facility].  He should be able to pick up 2 sensors that cost approx. $32.00 and will last a month. He advises that he will pick them up in the morning. Powell Mirza will see Mrs. Collymore on Thursday.    Mary Sharps, EMT-Paramedic [phone] 10/20/2024

## 2024-10-20 NOTE — Progress Notes (Unsigned)
     Patient ID: Nancy [name], female    DOB: 09/18/1961  MRN: [id number]  CC: Medical Management of Chronic Issues (Patient said she stills feels the same since getting out the hospital. Patient has N/V/D)   Subjective: Nancy [name] is a 63 y.o. female with past medical history of *** who presents to clinic for  Sat. 7/10  Allergies  Allergen Reactions   Hydralazine  Hcl Other (See Comments)    Hair loss   Hydrocodone Itching and Other (See Comments)    Upset stomach   Metformin  And Related Nausea And Vomiting and Other (See Comments)    Stomach pains, also   Other Nausea Only and Other (See Comments)    Lettuce- Does not digest this!!   Plaquenil [Hydroxychloroquine Sulfate] Hives   Shellfish Allergy Nausea And Vomiting   Shellfish Protein-Containing Drug Products Nausea Only and Other (See Comments)    Caused an upset stomach   Shrimp (Diagnostic) Nausea Only and Other (See Comments)    Upset stomach    Sulfa Antibiotics Hives   Sulfur  Hives    ROS: Review of Systems Negative except as stated above  PHYSICAL EXAM: BP (!) 155/86   Pulse 93   Temp 98.1 F (36.7 C) (Oral)   Resp 16   Ht 5' 3.5 (1.613 m)   Wt 230 lb 3.2 oz (104.4 kg)   SpO2 97%   BMI 40.14 kg/m   Physical Exam  General: well-appearing, no acute distress Skin: no jaundice, rashes, or lesions Cardiovascular: regular heart rate and rhythm, normal S1/S2, no murmurs, gallops, or rubs, peripheral pulses 2+ bilaterally Chest: no skeletal deformity, lungs clear to auscultation bilaterally, equal breath sounds bilaterally Abdomen: soft, non-distended, non-tender to palpation, no hepatomegaly, no splenomegaly, normoactive bowel sounds Musculoskeletal: normal gait Extremities: no peripheral edema  ASSESSMENT AND PLAN:  There are no diagnoses linked to this encounter.   Patient was given the opportunity to ask questions.  Patient verbalized understanding of the plan and was able to repeat  key elements of the plan.    No orders of the defined types were placed in this encounter.    Requested Prescriptions    No prescriptions requested or ordered in this encounter    No follow-ups on file.  [name], PA-C

## 2024-10-21 LAB — CBC
Hematocrit: 33.5 % — ABNORMAL LOW (ref 34.0–46.6)
Hemoglobin: 10.9 g/dL — ABNORMAL LOW (ref 11.1–15.9)
MCH: 30.4 pg (ref 26.6–33.0)
MCHC: 32.5 g/dL (ref 31.5–35.7)
MCV: 94 fL (ref 79–97)
Platelets: 266 x10E3/uL (ref 150–450)
RBC: 3.58 x10E6/uL — ABNORMAL LOW (ref 3.77–5.28)
RDW: 15.9 % — ABNORMAL HIGH (ref 11.7–15.4)
WBC: 12.1 x10E3/uL — ABNORMAL HIGH (ref 3.4–10.8)

## 2024-10-21 LAB — BASIC METABOLIC PANEL WITH GFR
BUN/Creatinine Ratio: 5 — ABNORMAL LOW (ref 12–28)
BUN: 28 mg/dL — ABNORMAL HIGH (ref 8–27)
CO2: 23 mmol/L (ref 20–29)
Calcium: 8.7 mg/dL (ref 8.7–10.3)
Chloride: 95 mmol/L — ABNORMAL LOW (ref 96–106)
Creatinine, Ser: 5.75 mg/dL — ABNORMAL HIGH (ref 0.57–1.00)
Glucose: 118 mg/dL — ABNORMAL HIGH (ref 70–99)
Potassium: 4.2 mmol/L (ref 3.5–5.2)
Sodium: 138 mmol/L (ref 134–144)
eGFR: 8 mL/min/1.73 — ABNORMAL LOW (ref >59)

## 2024-10-22 ENCOUNTER — Ambulatory Visit: Payer: Self-pay

## 2024-10-22 ENCOUNTER — Other Ambulatory Visit (HOSPITAL_COMMUNITY): Payer: Self-pay

## 2024-10-22 NOTE — Progress Notes (Signed)
 Paramedicine Encounter    Patient ID: Nancy [name], female    DOB: 1961/01/07, 63 y.o.   MRN: [id number]   Compliance with meds- no missed meds   Pill box filled- for one week   Refills needed- zofran  (called in)   Meds changes since last visit- none     Social changes- worried her medicare will lapse- told her that as long as she has no need for changes there is no reason to re-enroll.   VISIT SUMMARY- Met with Kariya in the home today to review meds and fill pill box as [name] is out today. I placed freestyle on right arm, meds reviewed and pill box filled for one week. Refill for Zofran  called in. No concerns at this time. Home visit complete. [name] will follow up next week. She agreed with plans.   There were no vitals taken for this visit. Med rec only       ACTION: Home visit completed     Patient Care Team: [name], MD as PCP - General (Internal Medicine) Court Dorn PARAS, MD as PCP - Cardiology (Cardiology) Rolan Ezra RAMAN, MD as PCP - Advanced Heart Failure (Cardiology) Center, [facility]  Patient Active Problem List   Diagnosis Date Noted   Acute colitis 08/10/2024   Rectal bleeding 08/10/2024   Abnormal CT scan, gastrointestinal tract 08/10/2024   Irritable bowel syndrome with constipation 06/04/2024   S/P bilateral BKA (below knee amputation) (HCC) 02/04/2024   Other disorders of phosphorus metabolism 12/26/2023   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   Abnormal microbiological findings in specimens from other organs, systems and tissues 06/06/2023   Infection of amputation stump, right lower extremity (HCC) 06/03/2023   Diarrhea, unspecified 05/06/2023   History of CVA (cerebrovascular accident) 03/22/2023   Community acquired pneumonia 03/22/2023   Encounter for removal of sutures 03/08/2023   Pain, unspecified 08/15/2022   Complication of vascular dialysis catheter 08/07/2022   Chest pain 05/30/2022   Fluid overload  05/30/2022   Iron  deficiency anemia, unspecified 04/26/2022   Nausea 04/10/2022   Hyponatremia 04/04/2022   Prolonged QT interval 04/04/2022   Anaphylactic shock, unspecified, initial encounter 02/23/2022   Other allergy, initial encounter 02/23/2022   Pressure injury of skin 02/19/2022   Anemia in chronic kidney disease 02/16/2022   Dependence on renal dialysis 02/16/2022   Hyperlipidemia, unspecified 02/16/2022   Hypertensive heart and chronic kidney disease with heart failure and with stage 5 chronic kidney disease, or end stage renal disease (HCC) 02/16/2022   Personal history of nicotine dependence 02/16/2022   Personal history of transient ischemic attack (TIA), and cerebral infarction without residual deficits 02/16/2022   Secondary hyperparathyroidism of renal origin 02/16/2022   Renal anasarca 02/14/2022   ESRD on dialysis (HCC) 02/14/2022   Hyperkalemia 02/14/2022   S/P arteriovenous (AV) fistula creation 12/22/2021   Peripheral artery disease 10/27/2021   Acute exacerbation of congestive heart failure (HCC) 10/24/2021   Acute cardiogenic pulmonary edema (HCC) 10/06/2021   CAD (coronary artery disease) 10/06/2021   Goals of care, counseling/discussion    Gangrene of left foot (HCC)    Cellulitis of left foot    CHF (congestive heart failure) (HCC) 07/07/2021   Acute postoperative anemia due to expected blood loss superimposed on anemia of chronic renal insufficiency 06/30/2021   CKD (chronic kidney disease), stage IV (HCC) 06/11/2021   Chronic diastolic CHF (congestive heart failure) (HCC) 06/10/2021   Abnormal nuclear stress test    Dyspnea on exertion 03/07/2021  Orthopnea 02/25/2021   History of cerebrovascular accident (CVA) with residual deficit 05/06/2020   AKI (acute kidney injury) 04/20/2020   Generalized weakness 04/20/2020   Dehydration 04/20/2020   Generalized abdominal pain    Pancreatitis, acute 02/29/2020   Cerebrovascular accident (CVA) (HCC)  11/08/2019   Stenosis of right carotid artery 11/08/2019   Cortical age-related cataract of both eyes 08/30/2019   Gait abnormality 08/30/2019   Statin declined 08/30/2019   Gastroesophageal reflux disease without esophagitis 04/18/2018   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   Type 2 diabetes mellitus (HCC) 10/29/2017   History of macular degeneration 10/29/2017   Chronic cervical pain 10/29/2016   Myalgia 09/14/2016   Insomnia 09/14/2016   Tinea pedis of both feet 08/20/2016   Macular degeneration 08/17/2016   Low back pain 09/21/2014   Hypertensive urgency 09/21/2014   HTN (hypertension) 09/08/2014   Intractable nausea and vomiting 09/07/2014   Thyroid  nodule 08/17/2014   Morbid obesity (HCC) 08/17/2014   Hypokalemia 08/06/2014   Type II diabetes mellitus with neurological manifestations, uncontrolled 08/04/2014    Current Outpatient Medications:    acetaminophen  (TYLENOL ) 500 MG tablet, Take 1,000 mg by mouth every 8 (eight) hours as needed for mild pain (pain score 1-3), moderate pain (pain score 4-6) or headache., Disp: , Rfl:    albuterol  (VENTOLIN  HFA) 108 (90 Base) MCG/ACT inhaler, Inhale 2 puffs into the lungs every 6 (six) hours as needed., Disp: 18 g, Rfl: 3   ALPRAZolam  (XANAX ) 0.25 MG tablet, Take 1 tablet (0.25 mg total) by mouth on Monday, Wednesday and Friday at hemodialysis., Disp: 12 tablet, Rfl: 2   amLODipine  (NORVASC ) 10 MG tablet, Take 1 tablet (10 mg total) by mouth daily for high blood pressure., Disp: 90 tablet, Rfl: 0   aspirin  81 MG EC tablet, Take 1 tablet (81 mg total) by mouth daily., Disp: 60 tablet, Rfl: 1   atorvastatin  (LIPITOR) 40 MG tablet, Take 1 tablet (40 mg total) by mouth daily for cholesterol., Disp: 90 tablet, Rfl: 1   brimonidine (ALPHAGAN) 0.2 % ophthalmic solution, Place 1 drop into both eyes 2 (two) times daily., Disp: 10 mL, Rfl: 6   Continuous Glucose Sensor (FREESTYLE LIBRE 3 PLUS SENSOR) MISC, Change sensor every 15 days. (Patient not taking:  Reported on 10/11/2024), Disp: 6 each, Rfl: 3   Continuous Glucose Sensor (FREESTYLE LIBRE 3 SENSOR) MISC, by Does not apply route. (Patient not taking: No sig reported), Disp: , Rfl:    ezetimibe  (ZETIA ) 10 MG tablet, Take 1 tablet (10 mg total) by mouth daily for cholesterol., Disp: 90 tablet, Rfl: 1   Glucagon  (BAQSIMI  ONE PACK) 3 MG/DOSE POWD, Place 1 Device into the nose as needed (Low blood sugar with impaired consciousness). (Patient not taking: Reported on 10/15/2024), Disp: 2 each, Rfl: 3   insulin  glargine (LANTUS  SOLOSTAR) 100 UNIT/ML Solostar Pen, Inject 5 Units into the skin 2 (two) times daily. Increased by Provider on 6/24, Disp: 15 mL, Rfl: 2   isosorbide  mononitrate (IMDUR ) 30 MG 24 hr tablet, Take 2 tablets (60 mg total) by mouth daily for blood pressure., Disp: 180 tablet, Rfl: 1   labetalol  (NORMODYNE ) 300 MG tablet, Take 1 tablet (300 mg total) by mouth 2 (two) times daily. (Patient taking differently: Take 1 tablet (300 mg total) by mouth 2 (two) times daily.), Disp: 180 tablet, Rfl: 1   levothyroxine  (SYNTHROID ) 25 MCG tablet, Take 1 tablet (25 mcg total) by mouth daily for low thyroid  hormone., Disp: 90 tablet, Rfl: 1  metoCLOPramide  (REGLAN ) 5 MG tablet, Take 1 tablet (5 mg total) by mouth every 6 (six) hours as needed for nausea., Disp: 60 tablet, Rfl: 1   omeprazole  (PRILOSEC) 20 MG capsule, Take 2 capsules (40 mg total) by mouth daily for acid reflux., Disp: 180 capsule, Rfl: 0   ondansetron  (ZOFRAN ) 4 MG tablet, Take 1 tablet (4 mg total) by mouth every 8 (eight) hours as needed for nausea., Disp: 90 tablet, Rfl: 3   oxyCODONE -acetaminophen  (PERCOCET/ROXICET) 5-325 MG tablet, Take 1 tablet by mouth every 6 (six) hours as needed for severe pain (pain score 7-10)., Disp: 6 tablet, Rfl: 0   sevelamer  carbonate (RENVELA ) 800 MG tablet, Take 3 tablets (2,400 mg total) by mouth 3 (three) times daily with meals and 1 tablet with snacks., Disp: 1080 tablet, Rfl: 3   sorbitol  70 %  SOLN, Take 30 mLs by mouth 2 (two) times daily. (Patient not taking: Reported on 10/15/2024), Disp: 473 mL, Rfl: 2   timolol (TIMOPTIC) 0.5 % ophthalmic solution, Place 1 drop into both eyes 2 (two) times daily., Disp: 10 mL, Rfl: 6 Allergies  Allergen Reactions   Hydralazine  Hcl Other (See Comments)    Hair loss   Hydrocodone Itching and Other (See Comments)    Upset stomach   Metformin  And Related Nausea And Vomiting and Other (See Comments)    Stomach pains, also   Other Nausea Only and Other (See Comments)    Lettuce- Does not digest this!!   Plaquenil [Hydroxychloroquine Sulfate] Hives   Shellfish Allergy Nausea And Vomiting   Shellfish Protein-Containing Drug Products Nausea Only and Other (See Comments)    Caused an upset stomach   Shrimp (Diagnostic) Nausea Only and Other (See Comments)    Upset stomach    Sulfa Antibiotics Hives   Sulfur  Hives     Social History   Socioeconomic History   Marital status: Legally Separated    Spouse name: Lamar   Number of children: 1   Years of education: some colle   Highest education level: Not on file  Occupational History   Occupation: Employed FT as warden/ranger    Comment: [employer] , NA  Tobacco Use   Smoking status: Former    Current packs/day: 0.50    Average packs/day: 0.5 packs/day for 44.3 years (22.0 ttl pk-yrs)    Types: Cigarettes    Start date: 12/16/1980    Quit date: 12/16/1974    Passive exposure: Never   Smokeless tobacco: Never   Tobacco comments:    Former smoker 11/03/21  Vaping Use   Vaping status: Never Used  Substance and Sexual Activity   Alcohol use: No   Drug use: No   Sexual activity: Not Currently    Birth control/protection: None  Other Topics Concern   Not on file  Social History Narrative   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    Social Drivers of Health   Financial Resource Strain: Low Risk  (02/04/2024)   Overall Financial Resource Strain  (CARDIA)    Difficulty of Paying Living Expenses: Not hard at all  Food Insecurity: No Food Insecurity (09/14/2024)   Hunger Vital Sign    Worried About Running Out of Food in the Last Year: Never true    Ran Out of Food in the Last Year: Never true  Transportation Needs: No Transportation Needs (09/14/2024)   PRAPARE - Administrator, Civil Service (Medical): No    Lack of Transportation (  Non-Medical): No  Physical Activity: Inactive (02/04/2024)   Exercise Vital Sign    Days of Exercise per Week: 0 days    Minutes of Exercise per Session: 0 min  Stress: No Stress Concern Present (02/04/2024)   [employer] of Occupational Health - Occupational Stress Questionnaire    Feeling of Stress : Not at all  Social Connections: Moderately Integrated (02/04/2024)   Social Connection and Isolation Panel    Frequency of Communication with Friends and Family: Twice a week    Frequency of Social Gatherings with Friends and Family: Once a week    Attends Religious Services: Never    Database Administrator or Organizations: Yes    Attends Banker Meetings: Never    Marital Status: Married  Catering Manager Violence: Not At Risk (09/15/2024)   Humiliation, Afraid, Rape, and Kick questionnaire    Fear of Current or Ex-Partner: No    Emotionally Abused: No    Physically Abused: No    Sexually Abused: No    Physical Exam      Future Appointments  Date Time Provider Department Center  11/19/2024  1:50 PM [name], MD [facility] [address]  12/01/2024  1:00 PM Dartha Ernst, MD [facility] None

## 2024-10-27 ENCOUNTER — Other Ambulatory Visit (HOSPITAL_COMMUNITY): Payer: Self-pay | Admitting: Emergency Medicine

## 2024-10-27 NOTE — Progress Notes (Signed)
 Paramedicine Encounter    Patient ID: Nancy [name], female    DOB: [birth year], 63 y.o.   MRN: [id number]   Complaints NONE  Assessment A&O x 4, skin W&D w/ good color.  Denies chest pain or SOB.  Lung sounds clear.  No edema noted.  Urination and bowel movements normal.  Compliance with meds YES  Pill box filled x 1 week  Refills needed NONE  Meds changes since last visit NONE    Social changes NONE   BP (!) 110/0 (BP Location: Right Arm, Patient Position: Sitting, Cuff Size: Normal) Comment: Palpated  Pulse 90   Resp 16   SpO2 94%  Weight yesterday- Last visit weight-  ACTION: Home visit completed  [name] [phone] 10/27/24  Patient Care Team: [name], MD as PCP - General (Internal Medicine) Court Dorn PARAS, MD as PCP - Cardiology (Cardiology) Rolan Ezra RAMAN, MD as PCP - Advanced Heart Failure (Cardiology) Center, [facility]  Patient Active Problem List   Diagnosis Date Noted   Acute colitis 08/10/2024   Rectal bleeding 08/10/2024   Abnormal CT scan, gastrointestinal tract 08/10/2024   Irritable bowel syndrome with constipation 06/04/2024   S/P bilateral BKA (below knee amputation) (HCC) 02/04/2024   Other disorders of phosphorus metabolism 12/26/2023   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   Abnormal microbiological findings in specimens from other organs, systems and tissues 06/06/2023   Infection of amputation stump, right lower extremity (HCC) 06/03/2023   Diarrhea, unspecified 05/06/2023   History of CVA (cerebrovascular accident) 03/22/2023   Community acquired pneumonia 03/22/2023   Encounter for removal of sutures 03/08/2023   Pain, unspecified 08/15/2022   Complication of vascular dialysis catheter 08/07/2022   Chest pain 05/30/2022   Fluid overload 05/30/2022   Iron  deficiency anemia, unspecified 04/26/2022   Nausea 04/10/2022   Hyponatremia 04/04/2022   Prolonged QT interval 04/04/2022    Anaphylactic shock, unspecified, initial encounter 02/23/2022   Other allergy, initial encounter 02/23/2022   Pressure injury of skin 02/19/2022   Anemia in chronic kidney disease 02/16/2022   Dependence on renal dialysis 02/16/2022   Hyperlipidemia, unspecified 02/16/2022   Hypertensive heart and chronic kidney disease with heart failure and with stage 5 chronic kidney disease, or end stage renal disease (HCC) 02/16/2022   Personal history of nicotine dependence 02/16/2022   Personal history of transient ischemic attack (TIA), and cerebral infarction without residual deficits 02/16/2022   Secondary hyperparathyroidism of renal origin 02/16/2022   Renal anasarca 02/14/2022   ESRD on dialysis (HCC) 02/14/2022   Hyperkalemia 02/14/2022   S/P arteriovenous (AV) fistula creation 12/22/2021   Peripheral artery disease 10/27/2021   Acute exacerbation of congestive heart failure (HCC) 10/24/2021   Acute cardiogenic pulmonary edema (HCC) 10/06/2021   CAD (coronary artery disease) 10/06/2021   Goals of care, counseling/discussion    Gangrene of left foot (HCC)    Cellulitis of left foot    CHF (congestive heart failure) (HCC) 07/07/2021   Acute postoperative anemia due to expected blood loss superimposed on anemia of chronic renal insufficiency 06/30/2021   CKD (chronic kidney disease), stage IV (HCC) 06/11/2021   Chronic diastolic CHF (congestive heart failure) (HCC) 06/10/2021   Abnormal nuclear stress test    Dyspnea on exertion 03/07/2021   Orthopnea 02/25/2021   History of cerebrovascular accident (CVA) with residual deficit 05/06/2020   AKI (acute kidney injury) 04/20/2020   Generalized weakness 04/20/2020   Dehydration 04/20/2020   Generalized abdominal pain    Pancreatitis, acute  02/29/2020   Cerebrovascular accident (CVA) (HCC) 11/08/2019   Stenosis of right carotid artery 11/08/2019   Cortical age-related cataract of both eyes 08/30/2019   Gait abnormality 08/30/2019   Statin  declined 08/30/2019   Gastroesophageal reflux disease without esophagitis 04/18/2018   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   Type 2 diabetes mellitus (HCC) 10/29/2017   History of macular degeneration 10/29/2017   Chronic cervical pain 10/29/2016   Myalgia 09/14/2016   Insomnia 09/14/2016   Tinea pedis of both feet 08/20/2016   Macular degeneration 08/17/2016   Low back pain 09/21/2014   Hypertensive urgency 09/21/2014   HTN (hypertension) 09/08/2014   Intractable nausea and vomiting 09/07/2014   Thyroid  nodule 08/17/2014   Morbid obesity (HCC) 08/17/2014   Hypokalemia 08/06/2014   Type II diabetes mellitus with neurological manifestations, uncontrolled 08/04/2014    Current Outpatient Medications:    acetaminophen  (TYLENOL ) 500 MG tablet, Take 1,000 mg by mouth every 8 (eight) hours as needed for mild pain (pain score 1-3), moderate pain (pain score 4-6) or headache., Disp: , Rfl:    albuterol  (VENTOLIN  HFA) 108 (90 Base) MCG/ACT inhaler, Inhale 2 puffs into the lungs every 6 (six) hours as needed., Disp: 18 g, Rfl: 3   ALPRAZolam  (XANAX ) 0.25 MG tablet, Take 1 tablet (0.25 mg total) by mouth on Monday, Wednesday and Friday at hemodialysis., Disp: 12 tablet, Rfl: 2   amLODipine  (NORVASC ) 10 MG tablet, Take 1 tablet (10 mg total) by mouth daily for high blood pressure., Disp: 90 tablet, Rfl: 0   aspirin  81 MG EC tablet, Take 1 tablet (81 mg total) by mouth daily., Disp: 60 tablet, Rfl: 1   atorvastatin  (LIPITOR) 40 MG tablet, Take 1 tablet (40 mg total) by mouth daily for cholesterol., Disp: 90 tablet, Rfl: 1   brimonidine (ALPHAGAN) 0.2 % ophthalmic solution, Place 1 drop into both eyes 2 (two) times daily., Disp: 10 mL, Rfl: 6   Continuous Glucose Sensor (FREESTYLE LIBRE 3 PLUS SENSOR) MISC, Change sensor every 15 days., Disp: 6 each, Rfl: 3   ezetimibe  (ZETIA ) 10 MG tablet, Take 1 tablet (10 mg total) by mouth daily for cholesterol., Disp: 90 tablet, Rfl: 1   insulin  glargine (LANTUS  SOLOSTAR)  100 UNIT/ML Solostar Pen, Inject 5 Units into the skin 2 (two) times daily. Increased by Provider on 6/24, Disp: 15 mL, Rfl: 2   isosorbide  mononitrate (IMDUR ) 30 MG 24 hr tablet, Take 2 tablets (60 mg total) by mouth daily for blood pressure., Disp: 180 tablet, Rfl: 1   labetalol  (NORMODYNE ) 300 MG tablet, Take 1 tablet (300 mg total) by mouth 2 (two) times daily. (Patient taking differently: Take 300 mg by mouth 2 (two) times daily. Mon/Wed/Fri takes evening dose only), Disp: 180 tablet, Rfl: 1   levothyroxine  (SYNTHROID ) 25 MCG tablet, Take 1 tablet (25 mcg total) by mouth daily for low thyroid  hormone., Disp: 90 tablet, Rfl: 1   metoCLOPramide  (REGLAN ) 5 MG tablet, Take 1 tablet (5 mg total) by mouth every 6 (six) hours as needed for nausea., Disp: 60 tablet, Rfl: 1   omeprazole  (PRILOSEC) 20 MG capsule, Take 2 capsules (40 mg total) by mouth daily for acid reflux., Disp: 180 capsule, Rfl: 0   ondansetron  (ZOFRAN ) 4 MG tablet, Take 1 tablet (4 mg total) by mouth every 8 (eight) hours as needed for nausea., Disp: 90 tablet, Rfl: 3   sevelamer  carbonate (RENVELA ) 800 MG tablet, Take 3 tablets (2,400 mg total) by mouth 3 (three) times daily with meals and 1  tablet with snacks., Disp: 1080 tablet, Rfl: 3   timolol (TIMOPTIC) 0.5 % ophthalmic solution, Place 1 drop into both eyes 2 (two) times daily., Disp: 10 mL, Rfl: 6   Continuous Glucose Sensor (FREESTYLE LIBRE 3 SENSOR) MISC, by Does not apply route. (Patient not taking: Reported on 10/27/2024), Disp: , Rfl:    Glucagon  (BAQSIMI  ONE PACK) 3 MG/DOSE POWD, Place 1 Device into the nose as needed (Low blood sugar with impaired consciousness). (Patient not taking: Reported on 10/27/2024), Disp: 2 each, Rfl: 3   oxyCODONE -acetaminophen  (PERCOCET/ROXICET) 5-325 MG tablet, Take 1 tablet by mouth every 6 (six) hours as needed for severe pain (pain score 7-10). (Patient not taking: Reported on 10/27/2024), Disp: 6 tablet, Rfl: 0   sorbitol  70 % SOLN, Take 30  mLs by mouth 2 (two) times daily. (Patient not taking: Reported on 10/27/2024), Disp: 473 mL, Rfl: 2 Allergies  Allergen Reactions   Hydralazine  Hcl Other (See Comments)    Hair loss   Hydrocodone Itching and Other (See Comments)    Upset stomach   Metformin  And Related Nausea And Vomiting and Other (See Comments)    Stomach pains, also   Other Nausea Only and Other (See Comments)    Lettuce- Does not digest this!!   Plaquenil [Hydroxychloroquine Sulfate] Hives   Shellfish Allergy Nausea And Vomiting   Shellfish Protein-Containing Drug Products Nausea Only and Other (See Comments)    Caused an upset stomach   Shrimp (Diagnostic) Nausea Only and Other (See Comments)    Upset stomach    Sulfa Antibiotics Hives   Sulfur  Hives     Social History   Socioeconomic History   Marital status: Legally Separated    Spouse name: Lamar   Number of children: 1   Years of education: some colle   Highest education level: Not on file  Occupational History   Occupation: Employed FT as warden/ranger    Comment: [employer] , NA  Tobacco Use   Smoking status: Former    Current packs/day: 0.50    Average packs/day: 0.5 packs/day for 44.4 years (22.1 ttl pk-yrs)    Types: Cigarettes    Start date: 12/16/1980    Quit date: 12/16/1974    Passive exposure: Never   Smokeless tobacco: Never   Tobacco comments:    Former smoker 11/03/21  Vaping Use   Vaping status: Never Used  Substance and Sexual Activity   Alcohol use: No   Drug use: No   Sexual activity: Not Currently    Birth control/protection: None  Other Topics Concern   Not on file  Social History Narrative   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    Social Drivers of Health   Financial Resource Strain: Low Risk  (02/04/2024)   Overall Financial Resource Strain (CARDIA)    Difficulty of Paying Living Expenses: Not hard at all  Food Insecurity: No Food Insecurity (09/14/2024)   Hunger Vital Sign     Worried About Running Out of Food in the Last Year: Never true    Ran Out of Food in the Last Year: Never true  Transportation Needs: No Transportation Needs (09/14/2024)   PRAPARE - Administrator, Civil Service (Medical): No    Lack of Transportation (Non-Medical): No  Physical Activity: Inactive (02/04/2024)   Exercise Vital Sign    Days of Exercise per Week: 0 days    Minutes of Exercise per Session: 0 min  Stress: No Stress Concern  Present (02/04/2024)   [employer] of Occupational Health - Occupational Stress Questionnaire    Feeling of Stress : Not at all  Social Connections: Moderately Integrated (02/04/2024)   Social Connection and Isolation Panel    Frequency of Communication with Friends and Family: Twice a week    Frequency of Social Gatherings with Friends and Family: Once a week    Attends Religious Services: Never    Database Administrator or Organizations: Yes    Attends Banker Meetings: Never    Marital Status: Married  Catering Manager Violence: Not At Risk (09/15/2024)   Humiliation, Afraid, Rape, and Kick questionnaire    Fear of Current or Ex-Partner: No    Emotionally Abused: No    Physically Abused: No    Sexually Abused: No    Physical Exam      Future Appointments  Date Time Provider Department Center  11/19/2024  1:50 PM [name], MD [facility] [address]  12/01/2024  1:00 PM Dartha Ernst, MD [facility] None

## 2024-11-03 ENCOUNTER — Other Ambulatory Visit (HOSPITAL_COMMUNITY): Payer: Self-pay

## 2024-11-04 ENCOUNTER — Other Ambulatory Visit: Payer: Self-pay

## 2024-11-04 ENCOUNTER — Other Ambulatory Visit (HOSPITAL_COMMUNITY): Payer: Self-pay

## 2024-11-06 ENCOUNTER — Other Ambulatory Visit (HOSPITAL_COMMUNITY): Payer: Self-pay | Admitting: Internal Medicine

## 2024-11-06 ENCOUNTER — Other Ambulatory Visit: Payer: Self-pay

## 2024-11-06 ENCOUNTER — Other Ambulatory Visit (HOSPITAL_COMMUNITY): Payer: Self-pay

## 2024-11-06 DIAGNOSIS — K219 Gastro-esophageal reflux disease without esophagitis: Secondary | ICD-10-CM

## 2024-11-06 MED ORDER — OMEPRAZOLE 20 MG PO CPDR
40.0000 mg | DELAYED_RELEASE_CAPSULE | Freq: Every day | ORAL | 0 refills | Status: DC
  Filled 2024-11-06: qty 180, 90d supply, fill #0

## 2024-11-09 ENCOUNTER — Other Ambulatory Visit: Payer: Self-pay

## 2024-11-11 ENCOUNTER — Other Ambulatory Visit (HOSPITAL_COMMUNITY): Payer: Self-pay | Admitting: Emergency Medicine

## 2024-11-11 NOTE — Progress Notes (Signed)
 Med rec only today.  Mr. Millirons had picked up meds from pharmacy.  Pill box reconciled x 1 week. Instructed and observed Mr. Parisien on how to apply and pair Freestyle Libre to her left arm.  He did so w/o difficulty.  Advised him that the sensors last only 15 days and need to be changed.  He advised he felt like he would not have an issue w/ future sensor applications. Pt. Denies chest pain or SOB.  Mrs. Toure and her husband are very much looking forward to tomorrow's Thanksgiving holiday.  Suggested pt be mindful of her food choices and she acknowledges same.    Mary Sharps, EMT-Paramedic [phone] 11/11/2024

## 2024-11-17 ENCOUNTER — Other Ambulatory Visit (HOSPITAL_COMMUNITY): Payer: Self-pay | Admitting: Emergency Medicine

## 2024-11-17 NOTE — Progress Notes (Signed)
 Med rec only.  Pt has diarrhea and doesn't want me to come into the residence.  Pill box reconciled x 1 week.  Per Mr. Kryder pt has no complaints.    Mary Sharps, EMT-Paramedic [phone] 11/17/2024

## 2024-11-18 ENCOUNTER — Telehealth: Payer: Self-pay | Admitting: Internal Medicine

## 2024-11-18 NOTE — Telephone Encounter (Signed)
 CONFIRMED APPT FOR 12/4

## 2024-11-19 ENCOUNTER — Other Ambulatory Visit (HOSPITAL_COMMUNITY): Payer: Self-pay

## 2024-11-19 ENCOUNTER — Ambulatory Visit: Attending: Internal Medicine | Admitting: Internal Medicine

## 2024-11-19 VITALS — BP 161/105 | HR 91 | Temp 98.3°F | Ht 63.0 in | Wt 240.0 lb

## 2024-11-19 DIAGNOSIS — K3184 Gastroparesis: Secondary | ICD-10-CM | POA: Diagnosis not present

## 2024-11-19 DIAGNOSIS — E1159 Type 2 diabetes mellitus with other circulatory complications: Secondary | ICD-10-CM

## 2024-11-19 DIAGNOSIS — I5032 Chronic diastolic (congestive) heart failure: Secondary | ICD-10-CM

## 2024-11-19 DIAGNOSIS — N186 End stage renal disease: Secondary | ICD-10-CM

## 2024-11-19 DIAGNOSIS — H1032 Unspecified acute conjunctivitis, left eye: Secondary | ICD-10-CM

## 2024-11-19 DIAGNOSIS — E039 Hypothyroidism, unspecified: Secondary | ICD-10-CM

## 2024-11-19 DIAGNOSIS — I69354 Hemiplegia and hemiparesis following cerebral infarction affecting left non-dominant side: Secondary | ICD-10-CM | POA: Insufficient documentation

## 2024-11-19 DIAGNOSIS — Z794 Long term (current) use of insulin: Secondary | ICD-10-CM

## 2024-11-19 DIAGNOSIS — Z992 Dependence on renal dialysis: Secondary | ICD-10-CM

## 2024-11-19 DIAGNOSIS — G8929 Other chronic pain: Secondary | ICD-10-CM

## 2024-11-19 DIAGNOSIS — Z5320 Procedure and treatment not carried out because of patient's decision for unspecified reasons: Secondary | ICD-10-CM

## 2024-11-19 DIAGNOSIS — R109 Unspecified abdominal pain: Secondary | ICD-10-CM

## 2024-11-19 DIAGNOSIS — I152 Hypertension secondary to endocrine disorders: Secondary | ICD-10-CM

## 2024-11-19 MED ORDER — ISOSORBIDE MONONITRATE ER 30 MG PO TB24
60.0000 mg | ORAL_TABLET | Freq: Every day | ORAL | 1 refills | Status: AC
  Filled 2024-11-19: qty 180, 90d supply, fill #0

## 2024-11-19 MED ORDER — LEVOTHYROXINE SODIUM 25 MCG PO TABS
25.0000 ug | ORAL_TABLET | Freq: Every day | ORAL | 1 refills | Status: AC
  Filled 2024-11-19: qty 90, 90d supply, fill #0

## 2024-11-19 MED ORDER — POLYMYXIN B-TRIMETHOPRIM 10000-0.1 UNIT/ML-% OP SOLN
1.0000 [drp] | Freq: Four times a day (QID) | OPHTHALMIC | 0 refills | Status: AC
  Filled 2024-11-19: qty 10, 50d supply, fill #0

## 2024-11-19 MED ORDER — ATORVASTATIN CALCIUM 40 MG PO TABS
40.0000 mg | ORAL_TABLET | Freq: Every day | ORAL | 1 refills | Status: AC
  Filled 2024-11-19: qty 90, 90d supply, fill #0

## 2024-11-19 MED ORDER — AMLODIPINE BESYLATE 10 MG PO TABS
10.0000 mg | ORAL_TABLET | Freq: Every day | ORAL | 1 refills | Status: AC
  Filled 2024-11-19 – 2025-01-06 (×4): qty 90, 90d supply, fill #0

## 2024-11-19 MED ORDER — LABETALOL HCL 300 MG PO TABS
300.0000 mg | ORAL_TABLET | Freq: Two times a day (BID) | ORAL | 1 refills | Status: AC
  Filled 2024-11-19: qty 180, 90d supply, fill #0

## 2024-11-19 MED ORDER — OMEPRAZOLE 20 MG PO CPDR
40.0000 mg | DELAYED_RELEASE_CAPSULE | Freq: Every day | ORAL | 0 refills | Status: AC
  Filled 2024-11-19: qty 180, 90d supply, fill #0

## 2024-11-19 MED ORDER — EZETIMIBE 10 MG PO TABS
10.0000 mg | ORAL_TABLET | Freq: Every day | ORAL | 1 refills | Status: AC
  Filled 2024-11-19: qty 90, 90d supply, fill #0

## 2024-11-19 NOTE — Progress Notes (Unsigned)
 Patient ID: Nancy [name], female    DOB: [birth year]  MRN: [id number]  CC: Follow-up (Follow-up. /Intractable nausea & vomiting X2 mo/Already received flu vax)   Subjective: Nancy [name] is a 63 y.o. female who presents for chronic ds management. Husband is with her. 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    Discussed the use of AI scribe software for clinical note transcription with the patient, who gave verbal consent to proceed.  History of Present Illness Nancy [name] is a 63 year old female with diabetes, hypertension, and congestive heart failure who presents for a routine follow-up visit. She is accompanied by her husband.  Has meds with her today. She is on a regimen of multiple medications including Sevelamer  (three tablets three times a day), omeprazole  40 mg daily, isosorbide  60 mg, levothyroxine  25 mcg daily, labetalol  (300 mg twice a day), atorvastatin  (40 mg), amlodipine  (10 mg), aspirin  (81 mg), Zetia  10 mg and Lantus  insulin  5 units BID.  DM: Her diabetes is well-controlled with a recent A1c of 5.7% from one month ago. She uses a continuous glucose monitor (Libre) linked to her phone. She experienced one episode of hypoglycemia with a blood sugar of 69 on Tuesday, which made her feel sleepy and was managed by drinking orange juice. Saw endo Dr. Darrol 08/27/24 and has f/u appt later this mth. Doing ok with eating habits.   HTN/ESRD/CHF: Her blood pressure today  was 161/105. She reported forgetting to take her blood pressure medications, which include isosorbide , labetalol , and amlodipine . She participates in an EMS program where a paramedic checks her blood pressure, with recent readings of 110/unknown on November 11 and 155/86 on November 4.  She attends dialysis sessions on Monday, Wednesday, and Friday for end-stage kidney disease. She was hospitalized twice since last visit with me for intractable vomiting and stomach pain, with a CT scan showing colitis during the first admission and no acute findings during the second. She takes metoclopramide  as needed for vomiting, which she has at home. She inquired about pain management for stomach pain, mentioning she was given oxycodone  during a hospital visit and has three pills left.  She has a cataract in the right eye and has been told she needs surgery on the left eye for a retinal issue. She also reports an eye infection in the left eye, described as crusted and leaking with slight pain over the 24 hrs.      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   Abnormal microbiological findings in specimens from other organs, systems and tissues 06/06/2023   Infection of amputation stump, right lower extremity (HCC) 06/03/2023  Diarrhea, unspecified 05/06/2023   History of CVA (cerebrovascular accident) 03/22/2023   Community acquired pneumonia 03/22/2023   Encounter for removal of sutures 03/08/2023   Pain, unspecified 08/15/2022   Complication of vascular dialysis  catheter 08/07/2022   Chest pain 05/30/2022   Fluid overload 05/30/2022   Iron  deficiency anemia, unspecified 04/26/2022   Nausea 04/10/2022   Hyponatremia 04/04/2022   Prolonged QT interval 04/04/2022   Anaphylactic shock, unspecified, initial encounter 02/23/2022   Other allergy, initial encounter 02/23/2022   Pressure injury of skin 02/19/2022   Anemia in chronic kidney disease 02/16/2022   Dependence on renal dialysis 02/16/2022   Hyperlipidemia, unspecified 02/16/2022   Hypertensive heart and chronic kidney disease with heart failure and with stage 5 chronic kidney disease, or end stage renal disease (HCC) 02/16/2022   Personal history of nicotine dependence 02/16/2022   Personal history of transient ischemic attack (TIA), and cerebral infarction without residual deficits 02/16/2022   Secondary hyperparathyroidism of renal origin 02/16/2022   Renal anasarca 02/14/2022   ESRD on dialysis (HCC) 02/14/2022   Hyperkalemia 02/14/2022   S/P arteriovenous (AV) fistula creation 12/22/2021   Peripheral artery disease 10/27/2021   Acute exacerbation of congestive heart failure (HCC) 10/24/2021   Acute cardiogenic pulmonary edema (HCC) 10/06/2021   CAD (coronary artery disease) 10/06/2021   Goals of care, counseling/discussion    Gangrene of left foot (HCC)    Cellulitis of left foot    CHF (congestive heart failure) (HCC) 07/07/2021   Acute postoperative anemia due to expected blood loss superimposed on anemia of chronic renal insufficiency 06/30/2021   CKD (chronic kidney disease), stage IV (HCC) 06/11/2021   Chronic diastolic CHF (congestive heart failure) (HCC) 06/10/2021   Abnormal nuclear stress test    Dyspnea on exertion 03/07/2021   Orthopnea 02/25/2021   History of cerebrovascular accident (CVA) with residual deficit 05/06/2020   AKI (acute kidney injury) 04/20/2020   Generalized weakness 04/20/2020   Dehydration 04/20/2020   Generalized abdominal pain    Pancreatitis,  acute 02/29/2020   Cerebrovascular accident (CVA) (HCC) 11/08/2019   Stenosis of right carotid artery 11/08/2019   Cortical age-related cataract of both eyes 08/30/2019   Gait abnormality 08/30/2019   Statin declined 08/30/2019   Gastroesophageal reflux disease without esophagitis 04/18/2018   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   Type 2 diabetes mellitus (HCC) 10/29/2017   History of macular degeneration 10/29/2017   Chronic cervical pain 10/29/2016   Myalgia 09/14/2016   Insomnia 09/14/2016   Tinea pedis of both feet 08/20/2016   Macular degeneration 08/17/2016   Low back pain 09/21/2014   Hypertensive urgency 09/21/2014   HTN (hypertension) 09/08/2014   Intractable nausea and vomiting 09/07/2014   Thyroid  nodule 08/17/2014   Morbid obesity (HCC) 08/17/2014   Hypokalemia 08/06/2014   Type II diabetes mellitus with neurological manifestations, uncontrolled 08/04/2014     Current Outpatient Medications on File Prior to Visit  Medication Sig Dispense Refill   acetaminophen  (TYLENOL ) 500 MG tablet Take 1,000 mg by mouth every 8 (eight) hours as needed for mild pain (pain score 1-3), moderate pain (pain score 4-6) or headache.     albuterol  (VENTOLIN  HFA) 108 (90 Base) MCG/ACT inhaler Inhale 2 puffs into the lungs every 6 (six) hours as needed. 18 g 3   ALPRAZolam  (XANAX ) 0.25 MG tablet Take 1 tablet (0.25 mg total) by mouth on Monday, Wednesday and Friday at hemodialysis. 12 tablet 2   aspirin  81 MG EC tablet Take 1 tablet (81 mg total) by  mouth daily. 60 tablet 1   brimonidine  (ALPHAGAN ) 0.2 % ophthalmic solution Place 1 drop into both eyes 2 (two) times daily. 10 mL 6   Continuous Glucose Sensor (FREESTYLE LIBRE 3 PLUS SENSOR) MISC Change sensor every 15 days. 6 each 3   Continuous Glucose Sensor (FREESTYLE LIBRE 3 SENSOR) MISC by Does not apply route.     insulin  glargine (LANTUS  SOLOSTAR) 100 UNIT/ML Solostar Pen Inject 5 Units into the skin 2 (two) times daily. Increased by Provider on 6/24  15 mL 2   metoCLOPramide  (REGLAN ) 5 MG tablet Take 1 tablet (5 mg total) by mouth every 6 (six) hours as needed for nausea. 60 tablet 1   ondansetron  (ZOFRAN ) 4 MG tablet Take 1 tablet (4 mg total) by mouth every 8 (eight) hours as needed for nausea. 90 tablet 3   sevelamer  carbonate (RENVELA ) 800 MG tablet Take 3 tablets (2,400 mg total) by mouth 3 (three) times daily with meals and 1 tablet with snacks. 1080 tablet 3   timolol  (TIMOPTIC ) 0.5 % ophthalmic solution Place 1 drop into both eyes 2 (two) times daily. 10 mL 6   Glucagon  (BAQSIMI  ONE PACK) 3 MG/DOSE POWD Place 1 Device into the nose as needed (Low blood sugar with impaired consciousness). (Patient not taking: Reported on 11/19/2024) 2 each 3   sorbitol  70 % SOLN Take 30 mLs by mouth 2 (two) times daily. (Patient not taking: Reported on 11/19/2024) 473 mL 2   [DISCONTINUED] Insulin  NPH Isophane & Regular (RELION 70/30 ) Inject 35 Units into the skin 2 (two) times daily.     No current facility-administered medications on file prior to visit.    Allergies  Allergen Reactions   Hydralazine  Hcl Other (See Comments)    Hair loss   Hydrocodone Itching and Other (See Comments)    Upset stomach   Metformin  And Related Nausea And Vomiting and Other (See Comments)    Stomach pains, also   Other Nausea Only and Other (See Comments)    Lettuce- Does not digest this!!   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    Comment: [employer] , [place]  Tobacco Use   Smoking status: Former    Current packs/day: 0.50    Average packs/day: 0.5 packs/day for 44.4 years (22.1 ttl pk-yrs)    Types: Cigarettes    Start date: 12/16/1980    Quit date: 12/16/1974    Passive exposure: Never   Smokeless tobacco: Never   Tobacco comments:    Former smoker 11/03/21  Vaping Use   Vaping status: Never Used  Substance and Sexual Activity   Alcohol use: No   Drug use: No   Sexual activity: Not Currently    Birth control/protection: None  Other Topics Concern   Not on file  Social History Narrative   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    Social  Drivers of Health   Financial Resource Strain: Low Risk  (02/04/2024)   Overall Financial Resource Strain (CARDIA)    Difficulty of Paying Living Expenses: Not hard at all  Food Insecurity: No Food Insecurity (09/14/2024)   Hunger Vital Sign    Worried About Running Out of Food in the Last Year: Never true    Ran Out of Food in the Last Year: Never true  Transportation Needs: No Transportation Needs (09/14/2024)   PRAPARE - Administrator, Civil Service (Medical): No    Lack of Transportation (Non-Medical): No  Physical Activity: Inactive (02/04/2024)   Exercise Vital Sign    Days of Exercise per Week: 0 days    Minutes of Exercise per Session: 0 min  Stress: No Stress Concern Present (02/04/2024)   [employer] of Occupational Health - Occupational Stress Questionnaire    Feeling of Stress : Not at all  Social Connections: Moderately Integrated (02/04/2024)   Social Connection and Isolation Panel    Frequency of Communication with Friends and Family: Twice a week    Frequency of Social Gatherings with Friends and Family: Once a week    Attends Religious Services: Never    Database Administrator or Organizations: Yes    Attends Banker Meetings: Never    Marital Status: Married  Catering Manager  Violence: Not At Risk (09/15/2024)   Humiliation, Afraid, Rape, and Kick questionnaire    Fear of Current or Ex-Partner: No    Emotionally Abused: No    Physically Abused: No    Sexually Abused: No    Family History  Problem Relation Age of Onset   Hypertension Mother    Heart disease Mother    Diabetes Mother    Thyroid  disease Mother    Congestive Heart Failure Mother    Breast cancer Maternal Grandmother    Colon cancer Maternal Grandfather    Heart attack Sister    Heart disease Brother    Hyperlipidemia Brother    Hypertension Brother    Diabetes Father    Breast cancer Maternal Aunt     Past Surgical History:  Procedure Laterality Date   A/V FISTULAGRAM Right 09/25/2022   Procedure: A/V Fistulagram;  Surgeon: Serene Gaile ORN, MD;  Location: [facility];  Service: Cardiovascular;  Laterality: Right;   ABDOMINAL HYSTERECTOMY  2005   AMPUTATION Bilateral 04/04/2022   Procedure: BILATERAL BELOW KNEE AMPUTATION;  Surgeon: Harden Jerona GAILS, MD;  Location: [facility] OR;  Service: Orthopedics;  Laterality: Bilateral;   AV FISTULA PLACEMENT Right 12/22/2021   Procedure: RIGHT ARM ARTERIOVENOUS (AV) BRACIOCPHELAIC FISTULA CREATION;  Surgeon: Sheree Penne Bruckner, MD;  Location: [facility] OR;  Service: Vascular;  Laterality: Right;   AV FISTULA PLACEMENT Right 02/19/2022   Procedure: RIGHT ARTERIOVENOUS FISTULA;  Surgeon: Eliza Bruckner RAMAN, MD;  Location: [facility] OR;  Service: Vascular;  Laterality: Right;   AV FISTULA PLACEMENT Left 12/24/2023   Procedure: LEFT BRACHIOCEPHALIC ARTERIOVENOUS (AV) FISTULA CREATION;  Surgeon: Lanis Fonda BRAVO, MD;  Location: [facility] OR;  Service: Vascular;  Laterality: Left;   BASCILIC VEIN TRANSPOSITION Right 08/07/2022   Procedure: SECOND STAGE RIGHT BASILIC VEIN TRANSPOSITION;  Surgeon: Eliza Bruckner RAMAN, MD;  Location: [facility] OR;  Service: Vascular;  Laterality: Right;   CATARACT EXTRACTION Left 11/2019   CESAREAN SECTION  1983   CHOLECYSTECTOMY N/A  03/05/2020   Procedure: LAPAROSCOPIC CHOLECYSTECTOMY WITH INTRAOPERATIVE CHOLANGIOGRAM;  Surgeon: Belinda Cough, MD;  Location: [facility] ORS;  Service: General;  Laterality:  N/A;   COLONOSCOPY     [facility] said no polyps were found   ESOPHAGOGASTRODUODENOSCOPY (EGD) WITH PROPOFOL  Left 08/26/2014   Procedure: ESOPHAGOGASTRODUODENOSCOPY (EGD) WITH PROPOFOL ;  Surgeon: Elsie Cree, MD;  Location: [facility];  Service: Endoscopy;  Laterality: Left;   ESOPHAGOGASTRODUODENOSCOPY (EGD) WITH PROPOFOL  N/A 03/03/2020   Procedure: ESOPHAGOGASTRODUODENOSCOPY (EGD) WITH PROPOFOL ;  Surgeon: Albertus Gordy HERO, MD;  Location: [facility];  Service: Gastroenterology;  Laterality: N/A;   IR FLUORO GUIDE CV LINE RIGHT  02/15/2022   IR US  GUIDE VASC ACCESS RIGHT  02/15/2022   PERIPHERAL VASCULAR BALLOON ANGIOPLASTY Right 09/25/2022   Procedure: PERIPHERAL VASCULAR BALLOON ANGIOPLASTY;  Surgeon: Serene Gaile ORN, MD;  Location: [facility];  Service: Cardiovascular;  Laterality: Right;  AVF   RIGHT HEART CATH N/A 07/14/2021   Procedure: RIGHT HEART CATH;  Surgeon: Rolan Ezra RAMAN, MD;  Location: [facility] INVASIVE CV LAB;  Service: Cardiovascular;  Laterality: N/A;   RIGHT/LEFT HEART CATH AND CORONARY ANGIOGRAPHY N/A 04/27/2021   Procedure: RIGHT/LEFT HEART CATH AND CORONARY ANGIOGRAPHY;  Surgeon: Court Dorn PARAS, MD;  Location: [facility];  Service: Cardiovascular;  Laterality: N/A;    ROS: Review of Systems Negative except as stated above  PHYSICAL EXAM: BP (!) 161/105 (BP Location: Right Arm, Patient Position: Sitting, Cuff Size: Large)   Pulse 91   Temp 98.3 F (36.8 C) (Oral)   Ht 5' 3 (1.6 m)   Wt 240 lb (108.9 kg)   SpO2 99%   BMI 42.51 kg/m   Physical Exam  General appearance - Older AAF sitting in WC who appears chronically ill. Has prosthesis on for BL BKA Mental status - answers most questions but forgetful at times Eyes - LT: moderate conjunctival redness with some white to  yellowish drainage Neck - supple, no significant adenopathy Chest - clear to auscultation, no wheezes, rales or rhonchi, symmetric air entry Heart - RRR Extremities - BKA BL wearing prosthesis  Lab Results  Component Value Date   TSH 3.510 06/04/2024       Latest Ref Rng & Units 10/20/2024    3:09 PM 10/11/2024    9:03 PM 09/17/2024    8:08 AM  CMP  Glucose 70 - 99 mg/dL 881  892  883   BUN 8 - 27 mg/dL 28  25  35   Creatinine 0.57 - 1.00 mg/dL 4.24  2.97  3.47   Sodium 134 - 144 mmol/L 138  139  132   Potassium 3.5 - 5.2 mmol/L 4.2  3.3  4.0   Chloride 96 - 106 mmol/L 95  98  92   CO2 20 - 29 mmol/L 23  21  22    Calcium  8.7 - 10.3 mg/dL 8.7  8.6  8.4   Total Protein 6.5 - 8.1 g/dL  6.5    Total Bilirubin 0.0 - 1.2 mg/dL  0.9    Alkaline Phos 38 - 126 U/L  59    AST 15 - 41 U/L  22    ALT 0 - 44 U/L  17     Lipid Panel     Component Value Date/Time   CHOL 110 10/08/2023 1145   CHOL 193 02/11/2020 1103   TRIG 95 10/08/2023 1145   HDL 45 10/08/2023 1145   HDL 40 02/11/2020 1103   CHOLHDL 2.4 10/08/2023 1145   VLDL 19 10/08/2023 1145   LDLCALC 46 10/08/2023 1145   LDLCALC 122 (H) 02/11/2020 1103    CBC  Component Value Date/Time   WBC 12.1 (H) 10/20/2024 1509   WBC 13.1 (H) 10/11/2024 2103   RBC 3.58 (L) 10/20/2024 1509   RBC 3.61 (L) 10/11/2024 2103   HGB 10.9 (L) 10/20/2024 1509   HGB 12.5 07/08/2008 1551   HCT 33.5 (L) 10/20/2024 1509   HCT 37.3 07/08/2008 1551   PLT 266 10/20/2024 1509   MCV 94 10/20/2024 1509   MCV 84.7 07/08/2008 1551   MCH 30.4 10/20/2024 1509   MCH 29.9 10/11/2024 2103   MCHC 32.5 10/20/2024 1509   MCHC 31.7 10/11/2024 2103   RDW 15.9 (H) 10/20/2024 1509   RDW 13.0 07/08/2008 1551   LYMPHSABS 1.2 09/14/2024 1047   LYMPHSABS 2.5 02/23/2021 1523   LYMPHSABS 4.6 (H) 07/08/2008 1551   MONOABS 0.6 09/14/2024 1047   MONOABS 0.6 07/08/2008 1551   EOSABS 0.2 09/14/2024 1047   EOSABS 0.5 (H) 02/23/2021 1523   BASOSABS 0.1 09/14/2024  1047   BASOSABS 0.1 02/23/2021 1523   BASOSABS 0.1 07/08/2008 1551    ASSESSMENT AND PLAN: 1. Type 2 diabetes mellitus with other circulatory complication, with long-term current use of insulin  (HCC) (Primary) Type 2 diabetes with recent A1c of 5.7%. Recent hypoglycemic episode managed with orange juice. Continuous glucose monitoring in use but forgot to bring reader today. Advise to remember to carry with her for her upcoming f/u appt with Dr. [facility] - Keep carbohydrates available for hypoglycemia management. - ezetimibe  (ZETIA ) 10 MG tablet; Take 1 tablet (10 mg total) by mouth daily for cholesterol.  Dispense: 90 tablet; Refill: 1  2. Hypertension associated with diabetes (HCC) Not at goal Did not take meds as yet for today. Advise to take now while in the office and continue taking them as prescribed daily - labetalol  (NORMODYNE ) 300 MG tablet; Take 1 tablet (300 mg total) by mouth 2 (two) times daily.  Dispense: 180 tablet; Refill: 1 - isosorbide  mononitrate (IMDUR ) 30 MG 24 hr tablet; Take 2 tablets (60 mg total) by mouth daily for blood pressure.  Dispense: 180 tablet; Refill: 1 - amLODipine  (NORVASC ) 10 MG tablet; Take 1 tablet (10 mg total) by mouth daily for high blood pressure.  Dispense: 90 tablet; Refill: 1  3. Chronic diastolic CHF (congestive heart failure) (HCC) Stable  - labetalol  (NORMODYNE ) 300 MG tablet; Take 1 tablet (300 mg total) by mouth 2 (two) times daily.  Dispense: 180 tablet; Refill: 1 - isosorbide  mononitrate (IMDUR ) 30 MG 24 hr tablet; Take 2 tablets (60 mg total) by mouth daily for blood pressure.  Dispense: 180 tablet; Refill: 1  4. ESRD on dialysis (HCC) Continue HD 3x/wk  5. Chronic abdominal pain Likely due to gastroparesis. Advise to take the Reglan  with meals - Ambulatory referral to Gastroenterology  6. Acute bacterial conjunctivitis of left eye Advised to call her ophthalmologist to request UC appt - trimethoprim -polymyxin b  (POLYTRIM )  ophthalmic solution; Place 1 drop into the left eye every 6 (six) hours.  Dispense: 10 mL; Refill: 0  7. Gastroparesis See #5 - Ambulatory referral to Gastroenterology  9. Hypothyroidism (acquired) Continue Levothyroxine . NL TSH in June 2025 - levothyroxine  (SYNTHROID ) 25 MCG tablet; Take 1 tablet (25 mcg total) by mouth daily for low thyroid  hormone.  Dispense: 90 tablet; Refill: 1 - atorvastatin  (LIPITOR) 40 MG tablet; Take 1 tablet (40 mg total) by mouth daily for cholesterol.  Dispense: 90 tablet; Refill: 1  10. Mammogram declined Recommended; pt declined.   Patient was given the opportunity to ask questions.  Patient verbalized  understanding of the plan and was able to repeat key elements of the plan.   This documentation was completed using Paediatric nurse.  Any transcriptional errors are unintentional.  Orders Placed This Encounter  Procedures   Ambulatory referral to Gastroenterology     Requested Prescriptions   Signed Prescriptions Disp Refills   labetalol  (NORMODYNE ) 300 MG tablet 180 tablet 1    Sig: Take 1 tablet (300 mg total) by mouth 2 (two) times daily.   ezetimibe  (ZETIA ) 10 MG tablet 90 tablet 1    Sig: Take 1 tablet (10 mg total) by mouth daily for cholesterol.   isosorbide  mononitrate (IMDUR ) 30 MG 24 hr tablet 180 tablet 1    Sig: Take 2 tablets (60 mg total) by mouth daily for blood pressure.   omeprazole  (PRILOSEC) 20 MG capsule 180 capsule 0    Sig: Take 2 capsules (40 mg total) by mouth daily for acid reflux.   levothyroxine  (SYNTHROID ) 25 MCG tablet 90 tablet 1    Sig: Take 1 tablet (25 mcg total) by mouth daily for low thyroid  hormone.   amLODipine  (NORVASC ) 10 MG tablet 90 tablet 1    Sig: Take 1 tablet (10 mg total) by mouth daily for high blood pressure.   atorvastatin  (LIPITOR) 40 MG tablet 90 tablet 1    Sig: Take 1 tablet (40 mg total) by mouth daily for cholesterol.   trimethoprim -polymyxin b  (POLYTRIM ) ophthalmic  solution 10 mL 0    Sig: Place 1 drop into the left eye every 6 (six) hours.    Return in about 4 months (around 03/20/2025).  Barnie Louder, MD, FACP

## 2024-11-19 NOTE — Patient Instructions (Signed)
  VISIT SUMMARY: Today, you had a routine follow-up visit to review your ongoing health conditions, including diabetes, hypertension, heart failure, and kidney disease. We discussed your recent health events, medication adherence, and addressed your concerns about stomach pain and an eye infection.  YOUR PLAN: -TYPE 2 DIABETES MELLITUS WITH CIRCULATORY COMPLICATIONS: Your diabetes is well-controlled with a recent A1c of 5.7%. You had a recent episode of low blood sugar, which you managed with orange juice. Please continue using your continuous glucose monitor and bring your phone with the [place] data to your endocrinologist appointment on December 16th. Keep fast-acting carbohydrates like juice or candy available in case of low blood sugar.  -CHRONIC DIASTOLIC HEART FAILURE: Your blood pressure was high today at 161/105 mmHg, likely because you missed your medications. High blood pressure can increase your risk of stroke. Please make sure to take your blood pressure medications (isosorbide , labetalol , and amlodipine ) consistently and monitor your blood pressure regularly, especially during your EMS visits.  -END STAGE RENAL DISEASE ON DIALYSIS: You are managing your kidney disease with dialysis sessions on Monday, Wednesday, and Friday. Please continue with this schedule.  -CHRONIC ABDOMINAL PAIN AND DIABETIC GASTROPARESIS: Your chronic stomach pain is likely related to diabetic gastroparesis, which affects how your stomach empties. You are using metoclopramide  for nausea and vomiting. You have been referred to a gastroenterologist for further evaluation. Please take metoclopramide  10-15 minutes before meals, 2-3 times daily. We may also consider a colonoscopy to screen for colon cancer.  -GASTROESOPHAGEAL REFLUX DISEASE: You have gastroesophageal reflux disease, which causes stomach acid to flow back into your esophagus. Please continue taking omeprazole  as prescribed.  -ACUTE BACTERIAL CONJUNCTIVITIS,  LEFT EYE: You have an eye infection in your left eye, which is causing crusting and yellowish drainage. You have been prescribed eye drops for this infection. If your symptoms do not improve, please contact Dr. Eyvonne.  -CATARACT, BILATERAL (PLANNED SURGERY LEFT EYE): You have cataracts in both eyes, and surgery for your left eye was canceled due to a conflict with your dialysis schedule. Please contact Dr. Eyvonne to reschedule your cataract surgery.  INSTRUCTIONS: Please follow up with your endocrinologist on December 16th and bring your phone with the [place] data. Continue your dialysis sessions on Monday, Wednesday, and Friday. Contact Dr. Eyvonne to reschedule your cataract surgery. If your eye infection symptoms do not improve with the prescribed eye drops, contact Dr. Eyvonne. Consider scheduling a colonoscopy for colon cancer screening.                      Contains text generated by [employer]bridge.                                 Contains text generated by Abridge.

## 2024-11-21 ENCOUNTER — Encounter: Payer: Self-pay | Admitting: Internal Medicine

## 2024-11-23 ENCOUNTER — Other Ambulatory Visit: Payer: Self-pay

## 2024-11-23 ENCOUNTER — Emergency Department (HOSPITAL_COMMUNITY)
Admission: EM | Admit: 2024-11-23 | Discharge: 2024-11-23 | Disposition: A | Discharge status: Home / Self Care | Attending: Emergency Medicine | Admitting: Emergency Medicine

## 2024-11-23 ENCOUNTER — Encounter (HOSPITAL_COMMUNITY): Payer: Self-pay | Admitting: *Deleted

## 2024-11-23 DIAGNOSIS — K3184 Gastroparesis: Secondary | ICD-10-CM

## 2024-11-23 DIAGNOSIS — R109 Unspecified abdominal pain: Secondary | ICD-10-CM

## 2024-11-23 DIAGNOSIS — R1084 Generalized abdominal pain: Secondary | ICD-10-CM

## 2024-11-23 LAB — CBC
HCT: 42.9 % (ref 36.0–46.0)
Hemoglobin: 13.8 g/dL (ref 12.0–15.0)
MCH: 30.3 pg (ref 26.0–34.0)
MCHC: 32.2 g/dL (ref 30.0–36.0)
MCV: 94.3 fL (ref 80.0–100.0)
Platelets: 244 K/uL (ref 150–400)
RBC: 4.55 MIL/uL (ref 3.87–5.11)
RDW: 14.3 % (ref 11.5–15.5)
WBC: 13.7 K/uL — ABNORMAL HIGH (ref 4.0–10.5)
nRBC: 0 % (ref 0.0–0.2)

## 2024-11-23 LAB — URINALYSIS, ROUTINE W REFLEX MICROSCOPIC
Bacteria, UA: NONE SEEN
Bilirubin Urine: NEGATIVE
Glucose, UA: 150 mg/dL — AB
Hgb urine dipstick: NEGATIVE
Ketones, ur: 5 mg/dL — AB
Leukocytes,Ua: NEGATIVE
Nitrite: NEGATIVE
Protein, ur: >=300 mg/dL — AB
Specific Gravity, Urine: 1.016 (ref 1.005–1.030)
pH: 8 (ref 5.0–8.0)

## 2024-11-23 LAB — COMPREHENSIVE METABOLIC PANEL WITH GFR
ALT: 12 U/L (ref 0–44)
AST: 12 U/L — ABNORMAL LOW (ref 15–41)
Albumin: 3.7 g/dL (ref 3.5–5.0)
Alkaline Phosphatase: 54 U/L (ref 38–126)
Anion gap: 21 — ABNORMAL HIGH (ref 5–15)
BUN: 39 mg/dL — ABNORMAL HIGH (ref 8–23)
CO2: 20 mmol/L — ABNORMAL LOW (ref 22–32)
Calcium: 9.3 mg/dL (ref 8.9–10.3)
Chloride: 95 mmol/L — ABNORMAL LOW (ref 98–111)
Creatinine, Ser: 7.87 mg/dL — ABNORMAL HIGH (ref 0.44–1.00)
GFR, Estimated: 5 mL/min — ABNORMAL LOW (ref >60)
Glucose, Bld: 154 mg/dL — ABNORMAL HIGH (ref 70–99)
Potassium: 4.6 mmol/L (ref 3.5–5.1)
Sodium: 136 mmol/L (ref 135–145)
Total Bilirubin: 0.9 mg/dL (ref 0.0–1.2)
Total Protein: 7.9 g/dL (ref 6.5–8.1)

## 2024-11-23 LAB — LIPASE, BLOOD: Lipase: 21 U/L (ref 11–51)

## 2024-11-23 MED ORDER — DROPERIDOL 2.5 MG/ML IJ SOLN
2.5000 mg | Freq: Once | INTRAMUSCULAR | Status: AC
  Administered 2024-11-23: 2.5 mg via INTRAVENOUS
  Filled 2024-11-23: qty 2

## 2024-11-23 MED ORDER — FENTANYL CITRATE (PF) 50 MCG/ML IJ SOSY
50.0000 ug | PREFILLED_SYRINGE | Freq: Once | INTRAMUSCULAR | Status: AC
  Administered 2024-11-23: 50 ug via INTRAVENOUS
  Filled 2024-11-23: qty 1

## 2024-11-23 MED ORDER — POLYETHYLENE GLYCOL 3350 17 G PO PACK
17.0000 g | PACK | Freq: Every day | ORAL | Status: DC
  Administered 2024-11-23: 17 g via ORAL
  Filled 2024-11-23: qty 1

## 2024-11-23 NOTE — ED Notes (Signed)
 Pt given water . Pt drank water  with no difficulties

## 2024-11-23 NOTE — ED Triage Notes (Addendum)
 Patient presents to ed via [facility] from home, c/o abd pain with vomiting onset yest am no BM since Sat. Patient is on home o2 at 2 liters always, Patient is a dialysis patient M-W-F has a tunnel cath left chest

## 2024-11-23 NOTE — ED Notes (Signed)
 EDP made aware of patient severe nausea.

## 2024-11-23 NOTE — ED Provider Notes (Addendum)
 [place] EMERGENCY DEPARTMENT AT [facility] Provider Note   CSN: [id number] Arrival date & time: 11/23/24  9191     Patient presents with: Abdominal Pain   Nancy [name] is a 63 y.o. female.   HPI     63 year old female with history of diabetes, ESRD on hemodialysis M WF, CAD, stroke, PVD and gastroparesis comes in with chief complaint of abdominal pain.  Patient also has a history of colitis and hematochezia, which improved with antibiotics.  Patient reports that the current abdominal pain episode started on Saturday.  The pain is constant and worsening.  She could not sleep overnight because of the pain.  Pain is described as sharp pain that is nonradiating.  She had emesis x 4 yesterday that had some brown color to it.  She denies any bloody stools or diarrhea.  In fact patient feels that she is constipated.  Patient states that the pain is similar to her gastroparesis pain.  Prior to Admission medications   Medication Sig Start Date End Date Taking? Authorizing Provider  acetaminophen  (TYLENOL ) 500 MG tablet Take 1,000 mg by mouth every 8 (eight) hours as needed for mild pain (pain score 1-3), moderate pain (pain score 4-6) or headache.    [provider]  albuterol  (VENTOLIN  HFA) 108 (90 Base) MCG/ACT inhaler Inhale 2 puffs into the lungs every 6 (six) hours as needed. 08/21/23   [name], MD  ALPRAZolam  (XANAX ) 0.25 MG tablet Take 1 tablet (0.25 mg total) by mouth on Monday, Wednesday and Friday at hemodialysis. 10/10/24   [name], MD  amLODipine  (NORVASC ) 10 MG tablet Take 1 tablet (10 mg total) by mouth daily for high blood pressure. 11/19/24   [name], MD  aspirin  81 MG EC tablet Take 1 tablet (81 mg total) by mouth daily. 09/26/21   [place], Harlene HERO, FNP  atorvastatin  (LIPITOR) 40 MG tablet Take 1 tablet (40 mg total) by mouth daily for cholesterol. 11/19/24   [name], MD  brimonidine  (ALPHAGAN ) 0.2 %  ophthalmic solution Place 1 drop into both eyes 2 (two) times daily. 10/08/24     Continuous Glucose Sensor (FREESTYLE LIBRE 3 PLUS SENSOR) MISC Change sensor every 15 days. 08/27/24   Komal, Motwani, MD  Continuous Glucose Sensor (FREESTYLE LIBRE 3 SENSOR) MISC by Does not apply route.    [provider]  ezetimibe  (ZETIA ) 10 MG tablet Take 1 tablet (10 mg total) by mouth daily for cholesterol. 11/19/24   [name], MD  Glucagon  (BAQSIMI  ONE PACK) 3 MG/DOSE POWD Place 1 Device into the nose as needed (Low blood sugar with impaired consciousness). Patient not taking: Reported on 11/19/2024 08/27/24   Komal, Motwani, MD  insulin  glargine (LANTUS  SOLOSTAR) 100 UNIT/ML Solostar Pen Inject 5 Units into the skin 2 (two) times daily. Increased by Provider on 6/24 06/16/24   [name], MD  isosorbide  mononitrate (IMDUR ) 30 MG 24 hr tablet Take 2 tablets (60 mg total) by mouth daily for blood pressure. 11/19/24   [name], MD  labetalol  (NORMODYNE ) 300 MG tablet Take 1 tablet (300 mg total) by mouth 2 (two) times daily. 11/19/24   [name], MD  levothyroxine  (SYNTHROID ) 25 MCG tablet Take 1 tablet (25 mcg total) by mouth daily for low thyroid  hormone. 11/19/24   [name], MD  metoCLOPramide  (REGLAN ) 5 MG tablet Take 1 tablet (5 mg total) by mouth every 6 (six) hours as needed for nausea.  09/18/24 09/18/25  Cherlyn Labella, MD  omeprazole  (PRILOSEC) 20 MG capsule Take 2 capsules (40 mg total) by mouth daily for acid reflux. 11/19/24   [name], MD  ondansetron  (ZOFRAN ) 4 MG tablet Take 1 tablet (4 mg total) by mouth every 8 (eight) hours as needed for nausea. 09/18/24   Akula, Vijaya, MD  sevelamer  carbonate (RENVELA ) 800 MG tablet Take 3 tablets (2,400 mg total) by mouth 3 (three) times daily with meals and 1 tablet with snacks. 09/04/23     sorbitol  70 % SOLN Take 30 mLs by mouth 2 (two) times daily. Patient not taking: Reported on 11/19/2024 09/18/24   Akula,  Vijaya, MD  timolol  (TIMOPTIC ) 0.5 % ophthalmic solution Place 1 drop into both eyes 2 (two) times daily. 10/08/24     trimethoprim -polymyxin b  (POLYTRIM ) ophthalmic solution Place 1 drop into the left eye every 6 (six) hours. 11/19/24   [name], MD  Insulin  NPH Isophane & Regular (RELION 70/30 [place]) Inject 35 Units into the skin 2 (two) times daily.  08/17/14  [provider]    Allergies: Hydralazine  hcl, Hydrocodone, Metformin  and related, Other, Plaquenil [hydroxychloroquine sulfate], Shellfish allergy, Shellfish protein-containing drug products, Shrimp (diagnostic), Sulfa antibiotics, and Sulfur     Review of Systems  All other systems reviewed and are negative.   Updated Vital Signs BP (!) 159/118   Pulse 90   Temp 98.2 F (36.8 C) (Oral)   Resp 12   Ht 5' 3 (1.6 m)   Wt 104.8 kg   SpO2 94%   BMI 40.92 kg/m   Physical Exam Vitals and nursing note reviewed.  Constitutional:      General: She is in acute distress.     Appearance: She is well-developed.     Comments: Patient crying and tearful because of pain  HENT:     Head: Atraumatic.  Cardiovascular:     Rate and Rhythm: Normal rate.  Pulmonary:     Effort: Pulmonary effort is normal.  Abdominal:     Tenderness: There is generalized abdominal tenderness and tenderness in the right upper quadrant, epigastric area, periumbilical area and left upper quadrant. There is no guarding or rebound.  Musculoskeletal:     Cervical back: Normal range of motion and neck supple.  Skin:    General: Skin is warm and dry.  Neurological:     Mental Status: She is alert and oriented to person, place, and time.     (all labs ordered are listed, but only abnormal results are displayed) Labs Reviewed  COMPREHENSIVE METABOLIC PANEL WITH GFR - Abnormal; Notable for the following components:      Result Value   Chloride 95 (*)    CO2 20 (*)    Glucose, Bld 154 (*)    BUN 39 (*)    Creatinine, Ser 7.87 (*)    AST 12  (*)    GFR, Estimated 5 (*)    Anion gap 21 (*)    All other components within normal limits  CBC - Abnormal; Notable for the following components:   WBC 13.7 (*)    All other components within normal limits  URINALYSIS, ROUTINE W REFLEX MICROSCOPIC - Abnormal; Notable for the following components:   Glucose, UA 150 (*)    Ketones, ur 5 (*)    Protein, ur >=300 (*)    All other components within normal limits  LIPASE, BLOOD    EKG: EKG Interpretation Date/Time:  Monday November 23 2024 08:36:22 EST  Ventricular Rate:  93 PR Interval:  164 QRS Duration:  82 QT Interval:  386 QTC Calculation: 479 R Axis:   -63  Text Interpretation: Normal sinus rhythm Left anterior fascicular block Moderate voltage criteria for LVH, may be normal variant ( R in aVL , Cornell product ) Abnormal QRS-T angle, consider primary T wave abnormality Abnormal ECG When compared with ECG of 11-Oct-2024 17:54, PREVIOUS ECG IS PRESENT Confirmed by Charlyn Sora [phone]) on 11/23/2024 9:51:20 AM  Radiology: No results found.   Procedures   Medications Ordered in the ED  polyethylene glycol (MIRALAX  / GLYCOLAX ) packet 17 g (17 g Oral Given 11/23/24 1330)  droperidol  (INAPSINE ) 2.5 MG/ML injection 2.5 mg (2.5 mg Intravenous Given 11/23/24 1039)  fentaNYL  (SUBLIMAZE ) injection 50 mcg (50 mcg Intravenous Given 11/23/24 1039)    Clinical Course as of 11/23/24 1432  Mon Nov 23, 2024  1204 Patient reassessed.  She is feeling comfortable now.  She is resting comfortably.  She is not crying.  She states her pain has improved significantly.  Repeat abdominal exam is reassuring.  No peritonitis.  Patient will get MiraLAX  and p.o. challenge now. [AN]  1427 Pt reassessed. Pt's VSS and WNL. Pt's cap refill < 3 seconds. Pt has been hydrated in the ER and now passed po challenge. We will discharge with antiemetic. Strict ER return precautions have been discussed and pt will return if he is unable to tolerate fluids and  symptoms are getting worse.  [AN]    Clinical Course User Index [AN] Charlyn Sora, MD                                 Medical Decision Making Amount and/or Complexity of Data Reviewed Labs: ordered.  Risk OTC drugs. Prescription drug management.   63 year old female with history of ESRD on hemodialysis, CAD, PVD, stroke and previous history of colitis and gastroparesis comes in with chief complaint of abdominal pain, nausea and vomiting.  Differential diagnosis considered includes: Pancreatitis, Hepatobiliary pathology including cholecystitis, Gastritis/PUD, small bowel obstruction, acute colitis -including ischemic colitis, ruptured viscus,  Mesenteric ischemia, gastroparesis related pain  Patient tearful during my assessment.  She thinks that the pain is similar to gastroparesis.  Plan is to get basic labs, give patient droperidol  and fentanyl  and reassess.  If not getting better, we will proceed with CT angio. Per patient's husband, dialysis has been rescheduled for later today.    Final diagnoses:  Gastroparesis  Generalized abdominal pain  Acute abdominal pain    ED Discharge Orders     None          Charlyn Sora, MD 11/23/24 1429    Charlyn Sora, MD 11/23/24 1433

## 2024-11-23 NOTE — Discharge Instructions (Addendum)
 We saw you in the ER for abdominal pain. The workup in the ER is overall reassuring. Our thought is that most likely your pain is because of gastroparesis.  Fortunately, we were able to get your pain in better control and you have passed oral challenge.  The workup in the ER is not complete, and is limited to screening for life threatening and emergent conditions only, so please see a primary care doctor for further evaluation.  Return to the ER if you start having worsening abdominal pain, severe nausea and vomiting, bloody emesis or bloody stool.

## 2024-11-23 NOTE — ED Notes (Signed)
Patient given to warm blankets.

## 2024-11-24 ENCOUNTER — Other Ambulatory Visit (HOSPITAL_COMMUNITY): Payer: Self-pay | Admitting: Emergency Medicine

## 2024-11-24 ENCOUNTER — Other Ambulatory Visit (HOSPITAL_COMMUNITY): Payer: Self-pay

## 2024-11-24 ENCOUNTER — Ambulatory Visit: Payer: Self-pay

## 2024-11-24 NOTE — Telephone Encounter (Signed)
 FYI Only or Action Required?: Action required by provider: requesting a call back from PCP with recommendations in regards to symptoms.  Patient was last seen in primary care on 11/19/2024 by [name], MD.  Called Nurse Triage reporting Constipation.  Symptoms began several days ago.  Interventions attempted: OTC medications: Miralax  and Rest, hydration, or home remedies.  Symptoms are: gradually worsening.  Triage Disposition: See HCP Within 4 Hours (Or PCP Triage)  Patient/caregiver understands and will follow disposition?: No, wishes to speak with PCP  Copied from CRM #[id number]. Topic: Clinical - Red Word Triage >> Nov 24, 2024  9:16 AM Tiffini S wrote: [facility] that prompted transfer to Nurse Triage: severe  congestion, cramps and very pain- not able to have a bowel movement Reason for Disposition  [1] Constant abdominal pain AND [2] present > 2 hours  Answer Assessment - Initial Assessment Questions Patient's husband on the phone for patient. Patient is crying in the background due to abdominal pain and constipation. Patient was seen at [facility] yesterday for abdominal pain. Husband states she was given some IV medication and Miralax -no scans done. Husband is concerned with her symptoms. Husband is requesting recommendations for patient's symptoms. Did give OTC options but husband is requesting a call back from PCP. Did discuss ED again but husband would like to speak to provider first.   1. STOOL PATTERN OR FREQUENCY: How often do you have a bowel movement (BM)?  (Normal range: 3 times a day to every 3 days)  When was your last BM?       Friday 2. STRAINING: Do you have to strain to have a BM?      yes 3. ONSET: When did the constipation begin?     Started Saturday 4. RECTAL PAIN: Does your rectum hurt when the stool comes out? If Yes, ask: Do you have hemorrhoids? How bad is the pain?  (Scale 1-10; or mild, moderate, severe)     Severe pain 5. BM  COMPOSITION: Are the stools hard?      Has been unable to pass any stool 6. BLOOD ON STOOLS: Has there been any blood on the toilet tissue or on the surface of the BM? If Yes, ask: When was the last time?     NA 7. CHRONIC CONSTIPATION: Is this a new problem for you?  If No, ask: How long have you had this problem? (days, weeks, months)      yes 8. CHANGES IN DIET OR HYDRATION: Have there been any recent changes in your diet? How much fluids are you drinking on a daily basis?  How much have you had to drink today?     No recent changes to diet.  9. MEDICINES: Have you been taking any new medicines? Are you taking any narcotic pain medicines? (e.g., Dilaudid , morphine , Percocet, Vicodin)     no 10. LAXATIVES: Have you been using any stool softeners, laxatives, or enemas?  If Yes, ask What are you using, how often, and when was the last time?       Was given Miralax  in the ED 11. ACTIVITY:  How much walking do you do every day?  Has your activity level decreased in the past week?        In a wheelchair 12. CAUSE: What do you think is causing the constipation?        unsure 13. MEDICAL HISTORY: Do you have a history of hemorrhoids, rectal fissures, rectal surgery, or rectal abscess?  no 14. OTHER SYMPTOMS: Do you have any other symptoms? (e.g., abdomen pain, bloating, fever, vomiting)       Abdominal pain  Protocols used: Constipation-A-AH

## 2024-11-25 NOTE — Telephone Encounter (Signed)
 Call to patient to offer an appointment for today. Unable to reach message left

## 2024-11-26 NOTE — Progress Notes (Signed)
 Med rec only.  Pt at dialysis treatment.    Mary Sharps, EMT-Paramedic [phone] 11/26/2024

## 2024-12-01 ENCOUNTER — Ambulatory Visit: Admitting: "Endocrinology

## 2024-12-01 ENCOUNTER — Other Ambulatory Visit (HOSPITAL_COMMUNITY): Payer: Self-pay | Admitting: Emergency Medicine

## 2024-12-01 NOTE — Progress Notes (Signed)
 Med rec only.  No vitals taken.  Pt w/ doctor's appointment w/ Endocrinologist today. Meds reconciled x 2 weeks.    Mary Sharps, EMT-Paramedic [phone] 12/01/2024

## 2024-12-03 ENCOUNTER — Other Ambulatory Visit (HOSPITAL_COMMUNITY): Payer: Self-pay

## 2024-12-11 ENCOUNTER — Other Ambulatory Visit: Payer: Self-pay

## 2024-12-11 ENCOUNTER — Other Ambulatory Visit (HOSPITAL_COMMUNITY): Payer: Self-pay

## 2024-12-11 ENCOUNTER — Encounter (HOSPITAL_COMMUNITY): Payer: Self-pay

## 2024-12-11 ENCOUNTER — Emergency Department (HOSPITAL_COMMUNITY)
Admission: EM | Admit: 2024-12-11 | Discharge: 2024-12-11 | Disposition: A | Discharge status: Home / Self Care | Attending: Emergency Medicine | Admitting: Emergency Medicine

## 2024-12-11 DIAGNOSIS — K5900 Constipation, unspecified: Secondary | ICD-10-CM | POA: Insufficient documentation

## 2024-12-11 DIAGNOSIS — Z7982 Long term (current) use of aspirin: Secondary | ICD-10-CM | POA: Insufficient documentation

## 2024-12-11 DIAGNOSIS — Z794 Long term (current) use of insulin: Secondary | ICD-10-CM | POA: Insufficient documentation

## 2024-12-11 DIAGNOSIS — Z992 Dependence on renal dialysis: Secondary | ICD-10-CM | POA: Insufficient documentation

## 2024-12-11 DIAGNOSIS — N186 End stage renal disease: Secondary | ICD-10-CM | POA: Diagnosis not present

## 2024-12-11 DIAGNOSIS — R1010 Upper abdominal pain, unspecified: Secondary | ICD-10-CM | POA: Insufficient documentation

## 2024-12-11 DIAGNOSIS — R1110 Vomiting, unspecified: Secondary | ICD-10-CM | POA: Insufficient documentation

## 2024-12-11 DIAGNOSIS — R112 Nausea with vomiting, unspecified: Secondary | ICD-10-CM

## 2024-12-11 LAB — COMPREHENSIVE METABOLIC PANEL WITH GFR
ALT: <5 U/L (ref 0–44)
AST: 15 U/L (ref 15–41)
Albumin: 3.5 g/dL (ref 3.5–5.0)
Alkaline Phosphatase: 54 U/L (ref 38–126)
Anion gap: 23 — ABNORMAL HIGH (ref 5–15)
BUN: 32 mg/dL — ABNORMAL HIGH (ref 8–23)
CO2: 15 mmol/L — ABNORMAL LOW (ref 22–32)
Calcium: 8.3 mg/dL — ABNORMAL LOW (ref 8.9–10.3)
Chloride: 101 mmol/L (ref 98–111)
Creatinine, Ser: 6.64 mg/dL — ABNORMAL HIGH (ref 0.44–1.00)
GFR, Estimated: 6 mL/min — ABNORMAL LOW
Glucose, Bld: 172 mg/dL — ABNORMAL HIGH (ref 70–99)
Potassium: 4.4 mmol/L (ref 3.5–5.1)
Sodium: 138 mmol/L (ref 135–145)
Total Bilirubin: 0.3 mg/dL (ref 0.0–1.2)
Total Protein: 6.7 g/dL (ref 6.5–8.1)

## 2024-12-11 LAB — LIPASE, BLOOD: Lipase: 21 U/L (ref 11–51)

## 2024-12-11 LAB — URINALYSIS, ROUTINE W REFLEX MICROSCOPIC
Bilirubin Urine: NEGATIVE
Glucose, UA: >=500 mg/dL — AB
Ketones, ur: 5 mg/dL — AB
Leukocytes,Ua: NEGATIVE
Nitrite: NEGATIVE
Protein, ur: >=300 mg/dL — AB
Specific Gravity, Urine: 1.014 (ref 1.005–1.030)
pH: 7 (ref 5.0–8.0)

## 2024-12-11 LAB — CBC
HCT: 47.6 % — ABNORMAL HIGH (ref 36.0–46.0)
Hemoglobin: 14.9 g/dL (ref 12.0–15.0)
MCH: 29.9 pg (ref 26.0–34.0)
MCHC: 31.3 g/dL (ref 30.0–36.0)
MCV: 95.6 fL (ref 80.0–100.0)
Platelets: 247 K/uL (ref 150–400)
RBC: 4.98 MIL/uL (ref 3.87–5.11)
RDW: 13.7 % (ref 11.5–15.5)
WBC: 17.3 K/uL — ABNORMAL HIGH (ref 4.0–10.5)
nRBC: 0 % (ref 0.0–0.2)

## 2024-12-11 MED ORDER — FAMOTIDINE IN NACL 20-0.9 MG/50ML-% IV SOLN
20.0000 mg | Freq: Once | INTRAVENOUS | Status: AC
  Administered 2024-12-11: 20 mg via INTRAVENOUS
  Filled 2024-12-11: qty 50

## 2024-12-11 MED ORDER — POLYETHYLENE GLYCOL 3350 17 G PO PACK
17.0000 g | PACK | Freq: Every day | ORAL | Status: DC
  Administered 2024-12-11: 17 g via ORAL
  Filled 2024-12-11: qty 1

## 2024-12-11 MED ORDER — FENTANYL CITRATE (PF) 50 MCG/ML IJ SOSY
50.0000 ug | PREFILLED_SYRINGE | Freq: Once | INTRAMUSCULAR | Status: AC
  Administered 2024-12-11: 50 ug via INTRAVENOUS
  Filled 2024-12-11: qty 1

## 2024-12-11 MED ORDER — POLYETHYLENE GLYCOL 3350 17 GM/SCOOP PO POWD
17.0000 g | Freq: Every day | ORAL | 0 refills | Status: AC
  Filled 2024-12-11 – 2024-12-25 (×2): qty 238, 14d supply, fill #0

## 2024-12-11 MED ORDER — DICYCLOMINE HCL 10 MG/ML IM SOLN
20.0000 mg | Freq: Once | INTRAMUSCULAR | Status: AC
  Administered 2024-12-11: 20 mg via INTRAMUSCULAR
  Filled 2024-12-11: qty 2

## 2024-12-11 MED ORDER — HYDROMORPHONE HCL 1 MG/ML IJ SOLN
1.0000 mg | Freq: Once | INTRAMUSCULAR | Status: AC
  Administered 2024-12-11: 1 mg via INTRAVENOUS
  Filled 2024-12-11: qty 1

## 2024-12-11 NOTE — ED Triage Notes (Signed)
 Pt BIB EMS from home for abdominal pain and emesis and diarrhea x2 days. Pt dialysis M-W-F but did not go today. Pt is yelling in pain upon arrival.   EMS Vitals BP 235/120 HR 94 Spo2 94% CBG 237 EMS gave 4mg  zofran  en route

## 2024-12-11 NOTE — Discharge Instructions (Signed)
 You have been seen and discharged from the emergency department.  Your blood work was normal for you.  You were treated with IV medicine and MiraLAX  which has been prescribed to you.  Follow-up with your primary provider for further evaluation and further care. Take home medications as prescribed. If you have any worsening symptoms or further concerns for your health please return to an emergency department for further evaluation.

## 2024-12-11 NOTE — ED Provider Notes (Signed)
 " [facility] EMERGENCY DEPARTMENT AT [facility] Provider Note   CSN: [id number] Arrival date & time: 12/11/24  1702     Patient presents with: Abdominal Pain and Emesis   Nancy [name] is a 63 y.o. female.   HPI   63 year old female with history of gastroparesis, ESRD on HD and compliant presents to the emergency department with upper abdominal pain, emesis for the past 2 days.  Patient states that this feels like her typical gastroparesis flareup.  There is been no blood in the emesis/stools.  No documented fever/chills.  Denies any genitourinary symptoms.  Prior to Admission medications  Medication Sig Start Date End Date Taking? Authorizing Provider  acetaminophen  (TYLENOL ) 500 MG tablet Take 1,000 mg by mouth every 8 (eight) hours as needed for mild pain (pain score 1-3), moderate pain (pain score 4-6) or headache.    [provider]  albuterol  (VENTOLIN  HFA) 108 (90 Base) MCG/ACT inhaler Inhale 2 puffs into the lungs every 6 (six) hours as needed. 08/21/23   [name], MD  ALPRAZolam  (XANAX ) 0.25 MG tablet Take 1 tablet (0.25 mg total) by mouth on Monday, Wednesday and Friday at hemodialysis. 10/10/24   [name], MD  amLODipine  (NORVASC ) 10 MG tablet Take 1 tablet (10 mg total) by mouth daily for high blood pressure. 11/19/24   [name], MD  aspirin  81 MG EC tablet Take 1 tablet (81 mg total) by mouth daily. 09/26/21   Milford, Harlene HERO, FNP  atorvastatin  (LIPITOR) 40 MG tablet Take 1 tablet (40 mg total) by mouth daily for cholesterol. 11/19/24   [name], MD  brimonidine  (ALPHAGAN ) 0.2 % ophthalmic solution Place 1 drop into both eyes 2 (two) times daily. 10/08/24     Continuous Glucose Sensor (FREESTYLE LIBRE 3 PLUS SENSOR) MISC Change sensor every 15 days. 08/27/24   Motwani, Komal, MD  Continuous Glucose Sensor (FREESTYLE LIBRE 3 SENSOR) MISC by Does not apply route.    [provider]  ezetimibe  (ZETIA ) 10 MG  tablet Take 1 tablet (10 mg total) by mouth daily for cholesterol. 11/19/24   [name], MD  Glucagon  (BAQSIMI  ONE PACK) 3 MG/DOSE POWD Place 1 Device into the nose as needed (Low blood sugar with impaired consciousness). Patient not taking: Reported on 11/26/2024 08/27/24   Motwani, Komal, MD  insulin  glargine (LANTUS  SOLOSTAR) 100 UNIT/ML Solostar Pen Inject 5 Units into the skin 2 (two) times daily. Increased by Provider on 6/24 06/16/24   [name], MD  isosorbide  mononitrate (IMDUR ) 30 MG 24 hr tablet Take 2 tablets (60 mg total) by mouth daily for blood pressure. 11/19/24   [name], MD  labetalol  (NORMODYNE ) 300 MG tablet Take 1 tablet (300 mg total) by mouth 2 (two) times daily. 11/19/24   [name], MD  levothyroxine  (SYNTHROID ) 25 MCG tablet Take 1 tablet (25 mcg total) by mouth daily for low thyroid  hormone. 11/19/24   [name], MD  metoCLOPramide  (REGLAN ) 5 MG tablet Take 1 tablet (5 mg total) by mouth every 6 (six) hours as needed for nausea. 09/18/24 09/18/25  Akula, Vijaya, MD  omeprazole  (PRILOSEC) 20 MG capsule Take 2 capsules (40 mg total) by mouth daily for acid reflux. 11/19/24   [name], MD  ondansetron  (ZOFRAN ) 4 MG tablet Take 1 tablet (4 mg total) by mouth every 8 (eight) hours as needed for nausea. Patient not taking: Reported on 11/26/2024 09/18/24   Cherlyn Labella, MD  sevelamer  carbonate (RENVELA ) 800 MG tablet Take 3 tablets (2,400 mg total) by mouth 3 (three) times daily with meals and 1 tablet with snacks. 09/04/23     sorbitol  70 % SOLN Take 30 mLs by mouth 2 (two) times daily. Patient not taking: Reported on 11/26/2024 09/18/24   Akula, Vijaya, MD  timolol  (TIMOPTIC ) 0.5 % ophthalmic solution Place 1 drop into both eyes 2 (two) times daily. 10/08/24     trimethoprim -polymyxin b  (POLYTRIM ) ophthalmic solution Place 1 drop into the left eye every 6 (six) hours. 11/19/24   [name], MD  Insulin  NPH Isophane & Regular  (RELION 70/30 ) Inject 35 Units into the skin 2 (two) times daily.  08/17/14  [provider]    Allergies: Hydralazine  hcl, Hydrocodone, Metformin  and related, Other, Plaquenil [hydroxychloroquine sulfate], Shellfish allergy, Shellfish protein-containing drug products, Shrimp (diagnostic), Sulfa antibiotics, and Sulfur     Review of Systems  Constitutional:  Negative for fever.  Respiratory:  Negative for shortness of breath.   Cardiovascular:  Negative for chest pain.  Gastrointestinal:  Positive for abdominal pain, constipation, nausea and vomiting. Negative for blood in stool.  Skin:  Negative for rash.  Neurological:  Negative for headaches.    Updated Vital Signs BP (!) 157/99   Pulse 86   Temp 98 F (36.7 C) (Oral)   Resp (!) 23   SpO2 100%   Physical Exam Vitals and nursing note reviewed.  Constitutional:      Appearance: Normal appearance.     Comments: Initially crying in pain but redirectable and conversational  HENT:     Head: Normocephalic.     Mouth/Throat:     Mouth: Mucous membranes are moist.  Cardiovascular:     Rate and Rhythm: Normal rate.  Pulmonary:     Effort: Pulmonary effort is normal. No respiratory distress.  Abdominal:     Palpations: Abdomen is soft.     Tenderness: There is generalized abdominal tenderness. There is no guarding or rebound.  Skin:    General: Skin is warm.  Neurological:     Mental Status: She is alert and oriented to person, place, and time. Mental status is at baseline.  Psychiatric:        Mood and Affect: Mood normal.     (all labs ordered are listed, but only abnormal results are displayed) Labs Reviewed  COMPREHENSIVE METABOLIC PANEL WITH GFR - Abnormal; Notable for the following components:      Result Value   CO2 15 (*)    Glucose, Bld 172 (*)    BUN 32 (*)    Creatinine, Ser 6.64 (*)    Calcium  8.3 (*)    GFR, Estimated 6 (*)    Anion gap 23 (*)    All other components within normal limits  CBC -  Abnormal; Notable for the following components:   WBC 17.3 (*)    HCT 47.6 (*)    All other components within normal limits  URINALYSIS, ROUTINE W REFLEX MICROSCOPIC - Abnormal; Notable for the following components:   APPearance CLOUDY (*)    Glucose, UA >=500 (*)    Hgb urine dipstick SMALL (*)    Ketones, ur 5 (*)    Protein, ur >=300 (*)    Bacteria, UA MANY (*)    All other components within normal limits  LIPASE, BLOOD    EKG: EKG Interpretation Date/Time:  Friday December 11 2024 17:53:58 EST Ventricular Rate:  91 PR Interval:  176 QRS Duration:  87 QT Interval:  390 QTC Calculation: 480 R Axis:   -51  Text Interpretation: Sinus rhythm Left anterior fascicular block Left ventricular hypertrophy Anterior Q waves, possibly due to LVH Nonspecific T abnormalities, lateral leads similar to previous Confirmed by Bari Flank [phone]) on 12/11/2024 7:07:54 PM  Radiology: No results found.   Procedures   Medications Ordered in the ED  polyethylene glycol (MIRALAX  / GLYCOLAX ) packet 17 g (has no administration in time range)  HYDROmorphone  (DILAUDID ) injection 1 mg (has no administration in time range)  famotidine  (PEPCID ) IVPB 20 mg premix (0 mg Intravenous Stopped 12/11/24 1840)  fentaNYL  (SUBLIMAZE ) injection 50 mcg (50 mcg Intravenous Given 12/11/24 1803)  dicyclomine  (BENTYL ) injection 20 mg (20 mg Intramuscular Given 12/11/24 1805)                                    Medical Decision Making Amount and/or Complexity of Data Reviewed Labs: ordered.  Risk OTC drugs. Prescription drug management.   63 year old female with past medical history of gastroparesis, chronic abdominal pain presents emergency department with abdominal pain, nonbloody emesis, constipation.  Patient is compliant with dialysis.  Initially moaning in pain but redirectable.  Abdomen is diffusely tender but benign.  She is still passing gas, no obstructive symptoms.  Blood work is baseline for  the patient.  After IV medicine her pain is improved/resolved.  Do not feel she warrants emergent imaging given resolution of symptoms.  Discussed with them daily MiraLAX  in regards to the ongoing constipation.  They did ask for prescription of pain medicine to take at home and I explained how this would not be beneficial in the setting of gastroparesis/chronic abdominal pain/constipation.  Patient at this time appears safe and stable for discharge and close outpatient follow up. Discharge plan and strict return to ED precautions discussed, patient verbalizes understanding and agreement.     Final diagnoses:  None    ED Discharge Orders     None          Bari Flank HERO, DO 12/11/24 2053  "

## 2024-12-12 ENCOUNTER — Other Ambulatory Visit (HOSPITAL_COMMUNITY): Payer: Self-pay

## 2024-12-16 ENCOUNTER — Other Ambulatory Visit (HOSPITAL_COMMUNITY): Payer: Self-pay

## 2024-12-18 ENCOUNTER — Other Ambulatory Visit (HOSPITAL_COMMUNITY): Payer: Self-pay

## 2024-12-23 ENCOUNTER — Other Ambulatory Visit (HOSPITAL_COMMUNITY): Payer: Self-pay

## 2024-12-24 ENCOUNTER — Telehealth (HOSPITAL_COMMUNITY): Payer: Self-pay

## 2024-12-24 ENCOUNTER — Other Ambulatory Visit (HOSPITAL_COMMUNITY): Payer: Self-pay

## 2024-12-24 NOTE — Telephone Encounter (Signed)
 HF Paramedicine Team Based Care Meeting    [facility] HF Paramedicine  Nancy [name] [facility] admit within the last 30 days- No admission but ER visit on 12/8, 12/26 for Nausea and Vomiting.   Medications concerns- None- pill box- biweekly   Transportation issues- husband transports her   Education needs- None   SDOH- recently moved   Eligible for discharge- Pending bubble packs if agreeable   Powell [name], EMT-Paramedic [phone] 12/24/2024

## 2024-12-25 ENCOUNTER — Other Ambulatory Visit (HOSPITAL_COMMUNITY): Payer: Self-pay

## 2024-12-29 ENCOUNTER — Other Ambulatory Visit (HOSPITAL_COMMUNITY): Payer: Self-pay

## 2024-12-30 ENCOUNTER — Other Ambulatory Visit (HOSPITAL_COMMUNITY): Payer: Self-pay | Admitting: Emergency Medicine

## 2025-01-05 ENCOUNTER — Other Ambulatory Visit (HOSPITAL_COMMUNITY): Payer: Self-pay

## 2025-01-06 ENCOUNTER — Other Ambulatory Visit (HOSPITAL_COMMUNITY): Payer: Self-pay

## 2025-01-06 NOTE — Progress Notes (Signed)
 Med rec only.  Pt was not home due to being at dailysis.  Will stop by next week for further follow up.     Mary Sharps, EMT-Paramedic [phone] 01/06/2025

## 2025-01-14 ENCOUNTER — Encounter (HOSPITAL_COMMUNITY): Payer: Self-pay | Admitting: *Deleted

## 2025-01-14 ENCOUNTER — Other Ambulatory Visit: Payer: Self-pay

## 2025-01-14 ENCOUNTER — Other Ambulatory Visit (HOSPITAL_COMMUNITY): Payer: Self-pay

## 2025-01-14 ENCOUNTER — Emergency Department (HOSPITAL_COMMUNITY)

## 2025-01-14 ENCOUNTER — Emergency Department (HOSPITAL_COMMUNITY)
Admission: EM | Admit: 2025-01-14 | Discharge: 2025-01-14 | Disposition: A | Discharge status: Home / Self Care | Attending: Emergency Medicine | Admitting: Emergency Medicine

## 2025-01-14 DIAGNOSIS — R112 Nausea with vomiting, unspecified: Secondary | ICD-10-CM | POA: Insufficient documentation

## 2025-01-14 DIAGNOSIS — R1012 Left upper quadrant pain: Secondary | ICD-10-CM | POA: Diagnosis present

## 2025-01-14 DIAGNOSIS — Z794 Long term (current) use of insulin: Secondary | ICD-10-CM | POA: Diagnosis not present

## 2025-01-14 DIAGNOSIS — N186 End stage renal disease: Secondary | ICD-10-CM | POA: Insufficient documentation

## 2025-01-14 DIAGNOSIS — Z7982 Long term (current) use of aspirin: Secondary | ICD-10-CM | POA: Insufficient documentation

## 2025-01-14 DIAGNOSIS — Z992 Dependence on renal dialysis: Secondary | ICD-10-CM | POA: Diagnosis not present

## 2025-01-14 DIAGNOSIS — I2510 Atherosclerotic heart disease of native coronary artery without angina pectoris: Secondary | ICD-10-CM | POA: Insufficient documentation

## 2025-01-14 DIAGNOSIS — E1122 Type 2 diabetes mellitus with diabetic chronic kidney disease: Secondary | ICD-10-CM | POA: Diagnosis not present

## 2025-01-14 DIAGNOSIS — I10 Essential (primary) hypertension: Secondary | ICD-10-CM

## 2025-01-14 DIAGNOSIS — I120 Hypertensive chronic kidney disease with stage 5 chronic kidney disease or end stage renal disease: Secondary | ICD-10-CM | POA: Diagnosis not present

## 2025-01-14 LAB — CBC WITH DIFFERENTIAL/PLATELET
Abs Immature Granulocytes: 0.07 10*3/uL (ref 0.00–0.07)
Basophils Absolute: 0.1 10*3/uL (ref 0.0–0.1)
Basophils Relative: 0 %
Eosinophils Absolute: 0 10*3/uL (ref 0.0–0.5)
Eosinophils Relative: 0 %
HCT: 44 % (ref 36.0–46.0)
Hemoglobin: 14.1 g/dL (ref 12.0–15.0)
Immature Granulocytes: 1 %
Lymphocytes Relative: 12 %
Lymphs Abs: 1.7 10*3/uL (ref 0.7–4.0)
MCH: 30.2 pg (ref 26.0–34.0)
MCHC: 32 g/dL (ref 30.0–36.0)
MCV: 94.2 fL (ref 80.0–100.0)
Monocytes Absolute: 0.8 10*3/uL (ref 0.1–1.0)
Monocytes Relative: 5 %
Neutro Abs: 12.1 10*3/uL — ABNORMAL HIGH (ref 1.7–7.7)
Neutrophils Relative %: 82 %
Platelets: 219 10*3/uL (ref 150–400)
RBC: 4.67 MIL/uL (ref 3.87–5.11)
RDW: 13.8 % (ref 11.5–15.5)
WBC: 14.7 10*3/uL — ABNORMAL HIGH (ref 4.0–10.5)
nRBC: 0 % (ref 0.0–0.2)

## 2025-01-14 LAB — COMPREHENSIVE METABOLIC PANEL WITH GFR
ALT: 8 U/L (ref 0–44)
AST: 28 U/L (ref 15–41)
Albumin: 4.1 g/dL (ref 3.5–5.0)
Alkaline Phosphatase: 68 U/L (ref 38–126)
Anion gap: 18 — ABNORMAL HIGH (ref 5–15)
BUN: 11 mg/dL (ref 8–23)
CO2: 28 mmol/L (ref 22–32)
Calcium: 9.1 mg/dL (ref 8.9–10.3)
Chloride: 92 mmol/L — ABNORMAL LOW (ref 98–111)
Creatinine, Ser: 4.62 mg/dL — ABNORMAL HIGH (ref 0.44–1.00)
GFR, Estimated: 10 mL/min — ABNORMAL LOW
Glucose, Bld: 195 mg/dL — ABNORMAL HIGH (ref 70–99)
Potassium: 4.2 mmol/L (ref 3.5–5.1)
Sodium: 138 mmol/L (ref 135–145)
Total Bilirubin: 0.4 mg/dL (ref 0.0–1.2)
Total Protein: 7.9 g/dL (ref 6.5–8.1)

## 2025-01-14 LAB — LIPASE, BLOOD: Lipase: <10 U/L — ABNORMAL LOW (ref 11–51)

## 2025-01-14 MED ORDER — OXYCODONE HCL 5 MG PO TABS
5.0000 mg | ORAL_TABLET | ORAL | 0 refills | Status: AC | PRN
  Filled 2025-01-14: qty 6, 1d supply, fill #0

## 2025-01-14 MED ORDER — ONDANSETRON 4 MG PO TBDP
4.0000 mg | ORAL_TABLET | Freq: Three times a day (TID) | ORAL | 0 refills | Status: AC | PRN
  Filled 2025-01-14: qty 20, 7d supply, fill #0

## 2025-01-14 MED ORDER — HYDROMORPHONE HCL 1 MG/ML IJ SOLN
2.0000 mg | Freq: Once | INTRAMUSCULAR | Status: DC
  Filled 2025-01-14: qty 2

## 2025-01-14 MED ORDER — ONDANSETRON HCL 4 MG/2ML IJ SOLN
4.0000 mg | Freq: Once | INTRAMUSCULAR | Status: AC
  Administered 2025-01-14: 4 mg via INTRAVENOUS
  Filled 2025-01-14: qty 2

## 2025-01-14 MED ORDER — PANTOPRAZOLE SODIUM 40 MG PO TBEC
40.0000 mg | DELAYED_RELEASE_TABLET | Freq: Every day | ORAL | 0 refills | Status: AC
  Filled 2025-01-14: qty 30, 30d supply, fill #0

## 2025-01-14 MED ORDER — HYDRALAZINE HCL 20 MG/ML IJ SOLN
20.0000 mg | Freq: Once | INTRAMUSCULAR | Status: DC
  Filled 2025-01-14: qty 1

## 2025-01-14 MED ORDER — PANTOPRAZOLE SODIUM 40 MG IV SOLR
40.0000 mg | Freq: Once | INTRAVENOUS | Status: AC
  Administered 2025-01-14: 40 mg via INTRAVENOUS
  Filled 2025-01-14: qty 10

## 2025-01-14 MED ORDER — MORPHINE SULFATE (PF) 4 MG/ML IV SOLN
4.0000 mg | Freq: Once | INTRAVENOUS | Status: AC
  Administered 2025-01-14: 4 mg via INTRAVENOUS
  Filled 2025-01-14: qty 1

## 2025-01-14 MED ORDER — HYDROMORPHONE HCL 1 MG/ML IJ SOLN
1.0000 mg | Freq: Once | INTRAMUSCULAR | Status: AC
  Administered 2025-01-14: 1 mg via INTRAVENOUS

## 2025-01-14 MED ORDER — IOHEXOL 350 MG/ML SOLN
75.0000 mL | Freq: Once | INTRAVENOUS | Status: AC | PRN
  Administered 2025-01-14: 75 mL via INTRAVENOUS

## 2025-01-14 MED ORDER — HYDRALAZINE HCL 20 MG/ML IJ SOLN
20.0000 mg | Freq: Once | INTRAMUSCULAR | Status: AC
  Administered 2025-01-14: 20 mg via INTRAVENOUS

## 2025-01-14 NOTE — ED Triage Notes (Signed)
 Cbg 230  pt was dialyzed yesterday

## 2025-01-14 NOTE — ED Notes (Signed)
 Sent rainbow of blood work to lab.

## 2025-01-14 NOTE — Discharge Instructions (Signed)
 Your evaluation today did not show any serious cause for your pain.  You are being discharged with prescription for pantoprazole  which should help acid secretion and may help your pain.  However, if symptoms are not being adequately controlled at home, please return to the emergency department.  Make sure you go for your scheduled dialysis tomorrow.

## 2025-01-14 NOTE — ED Triage Notes (Signed)
 The pt arrived by gems from home  she was placed in a w/c on arrival  she is constantly making a noise like she is crying but she is not making any tears.  She is c/o abd pain for 2 days  she is a dialysis pt that has a chest dialysis catheter and a double ak amputation

## 2025-01-14 NOTE — ED Provider Notes (Signed)
 " [name] EMERGENCY DEPARTMENT AT [facility] Provider Note   CSN: [id number] Arrival date & time: 01/14/25  0230     Patient presents with: Abdominal Pain   Nancy [name] is a 64 y.o. female.   The history is provided by the patient.  Abdominal Pain  She has history of hypertension, diabetes, hyperlipidemia, stroke, heart failure, coronary artery disease, end-stage renal disease on hemodialysis and comes in complaining of severe left upper quadrant pain which started about 3 PM.  Pain does radiate to the back.  There is associated nausea and vomiting.  She has vomited about 5 times.  She did have some diarrhea, but states that she had also had some prune juice.  She denies fever, chills, sweats.  She has had similar pain in the past which she thinks was from colitis.  She was last dialyzed yesterday.    Prior to Admission medications  Medication Sig Start Date End Date Taking? Authorizing Provider  acetaminophen  (TYLENOL ) 500 MG tablet Take 1,000 mg by mouth every 8 (eight) hours as needed for mild pain (pain score 1-3), moderate pain (pain score 4-6) or headache.    [provider]  albuterol  (VENTOLIN  HFA) 108 (90 Base) MCG/ACT inhaler Inhale 2 puffs into the lungs every 6 (six) hours as needed. 08/21/23   [name], MD  ALPRAZolam  (XANAX ) 0.25 MG tablet Take 1 tablet (0.25 mg total) by mouth on Monday, Wednesday and Friday at hemodialysis. 10/10/24   [name], MD  amLODipine  (NORVASC ) 10 MG tablet Take 1 tablet (10 mg total) by mouth daily for high blood pressure. 11/19/24   [name], MD  aspirin  81 MG EC tablet Take 1 tablet (81 mg total) by mouth daily. 09/26/21   Milford, Harlene HERO, FNP  atorvastatin  (LIPITOR) 40 MG tablet Take 1 tablet (40 mg total) by mouth daily for cholesterol. 11/19/24   [name], MD  brimonidine  (ALPHAGAN ) 0.2 % ophthalmic solution Place 1 drop into both eyes 2 (two) times daily. 10/08/24      Continuous Glucose Sensor (FREESTYLE LIBRE 3 PLUS SENSOR) MISC Change sensor every 15 days. 08/27/24   Motwani, Komal, MD  Continuous Glucose Sensor (FREESTYLE LIBRE 3 SENSOR) MISC by Does not apply route.    [provider]  ezetimibe  (ZETIA ) 10 MG tablet Take 1 tablet (10 mg total) by mouth daily for cholesterol. 11/19/24   [name], MD  Glucagon  (BAQSIMI  ONE PACK) 3 MG/DOSE POWD Place 1 Device into the nose as needed (Low blood sugar with impaired consciousness). Patient not taking: Reported on 12/30/2024 08/27/24   Motwani, Komal, MD  insulin  glargine (LANTUS  SOLOSTAR) 100 UNIT/ML Solostar Pen Inject 5 Units into the skin 2 (two) times daily. Increased by Provider on 6/24 06/16/24   [name], MD  isosorbide  mononitrate (IMDUR ) 30 MG 24 hr tablet Take 2 tablets (60 mg total) by mouth daily for blood pressure. 11/19/24   [name], MD  labetalol  (NORMODYNE ) 300 MG tablet Take 1 tablet (300 mg total) by mouth 2 (two) times daily. 11/19/24   [name], MD  levothyroxine  (SYNTHROID ) 25 MCG tablet Take 1 tablet (25 mcg total) by mouth daily for low thyroid  hormone. 11/19/24   [name], MD  metoCLOPramide  (REGLAN ) 5 MG tablet Take 1 tablet (5 mg total) by mouth every 6 (six) hours as needed for nausea. 09/18/24 09/18/25  Akula, Vijaya, MD  omeprazole  (PRILOSEC) 20 MG capsule Take 2 capsules (  40 mg total) by mouth daily for acid reflux. 11/19/24   [name], MD  ondansetron  (ZOFRAN ) 4 MG tablet Take 1 tablet (4 mg total) by mouth every 8 (eight) hours as needed for nausea. Patient not taking: Reported on 12/30/2024 09/18/24   Akula, Vijaya, MD  polyethylene glycol powder (MIRALAX ) 17 GM/SCOOP powder Dissolve 1 capful (17g) in 4-8 ounces of liquid and take by mouth daily. 12/11/24   Horton, Roxie HERO, DO  sevelamer  carbonate (RENVELA ) 800 MG tablet Take 3 tablets (2,400 mg total) by mouth 3 (three) times daily with meals and 1 tablet with snacks. 09/04/23      sorbitol  70 % SOLN Take 30 mLs by mouth 2 (two) times daily. Patient not taking: Reported on 11/26/2024 09/18/24   Cherlyn Labella, MD  timolol  (TIMOPTIC ) 0.5 % ophthalmic solution Place 1 drop into both eyes 2 (two) times daily. 10/08/24     trimethoprim -polymyxin b  (POLYTRIM ) ophthalmic solution Place 1 drop into the left eye every 6 (six) hours. 11/19/24   [name], MD  Insulin  NPH Isophane & Regular (RELION 70/30 ) Inject 35 Units into the skin 2 (two) times daily.  08/17/14  [provider]    Allergies: Hydralazine  hcl, Hydrocodone, Metformin  and related, Other, Plaquenil [hydroxychloroquine sulfate], Shellfish allergy, Shellfish protein-containing drug products, Shrimp (diagnostic), Sulfa antibiotics, and Sulfur     Review of Systems  Gastrointestinal:  Positive for abdominal pain.  All other systems reviewed and are negative.   Updated Vital Signs BP (!) 213/127 (BP Location: Right Arm)   Pulse 93   Temp 98.2 F (36.8 C) (Oral)   Resp 18   Ht 5' 3 (1.6 m)   Wt 104.8 kg   SpO2 99%   BMI 40.93 kg/m   Physical Exam Vitals and nursing note reviewed.   64 year old female, crying in pain, but is in no acute distress. Vital signs are significant for elevated blood pressure. Oxygen saturation is 100%, which is normal. Head is normocephalic and atraumatic. PERRLA, EOMI. Oropharynx is clear. Neck is nontender and supple without adenopathy. Lungs are clear without rales, wheezes, or rhonchi. Chest is nontender.  Dialysis access catheters are present on the left. Heart has regular rate and rhythm without murmur. Abdomen is soft, flat, with moderate tenderness across the upper abdomen and into the left lower quadrant.  There is no rebound or guarding. Extremities: Bilateral below the knee amputations. Skin is warm and dry without rash. Neurologic: Awake and alert, moves all extremities equally.  (all labs ordered are listed, but only abnormal results are  displayed) Labs Reviewed  COMPREHENSIVE METABOLIC PANEL WITH GFR  LIPASE, BLOOD  CBC WITH DIFFERENTIAL/PLATELET  URINALYSIS, ROUTINE W REFLEX MICROSCOPIC    EKG: EKG Interpretation Date/Time:  Thursday January 14 2025 03:54:45 EST Ventricular Rate:  91 PR Interval:  166 QRS Duration:  92 QT Interval:  412 QTC Calculation: 507 R Axis:   -43  Text Interpretation: Sinus rhythm Left anterior fascicular block LVH with secondary repolarization abnormality Anterior Q waves, possibly due to LVH Prolonged QT interval When compared with ECG of 12/11/2024, No significant change was found Confirmed by Raford Lenis ([id number]) on 01/14/2025 4:00:38 AM  Radiology: No results found.   Procedures   Medications Ordered in the ED - No data to display  Medical Decision Making Amount and/or Complexity of Data Reviewed Radiology: ordered.  Risk Prescription drug management.   Abdominal pain with vomiting in patient with end-stage renal disease.  This is a presentation with wide range of treatment options and carries with a high risk morbidity and complications.  Differential diagnosis includes, but is not limited to, diverticulitis, colitis, gastritis, peptic ulcer disease GERD, choledocholithiasis, pancreatitis, abdominal aneurysm.  I have reviewed her past records, and note multiple ED visits for abdominal pain and vomiting, most recently on 12/11/2024, hospital admission for gastroparesis on 09/14/2024.  I have ordered workup including CT of abdomen and pelvis.  CT of abdomen and pelvis on 10/11/2024 showed only mild aortic atherosclerosis and no aneurysm.  I have reviewed her electrocardiogram, my interpretation is sinus rhythm, left anterior fascicular block, LVH with secondary repolarization abnormality, borderline prolonged QT interval which has increased slightly compared with prior.  I have reviewed her laboratory tests, and my interpretation is elevated  creatinine consistent with known history of end-stage renal disease, elevated glucose consistent with known history of diabetes, mild leukocytosis which is nonspecific.  CT scan of abdomen and pelvis shows diffuse esophageal wall thickening consistent with esophagitis, and no other acute findings.  Have independently viewed the images, and agree with the radiologist's interpretation.  Based on this, I have ordered a dose of intravenous pantoprazole .  On return from CT scan, patient's blood pressure went exceedingly high and I ordered a dose of intravenous hydralazine  and blood pressures come down to a safe level.  At this point, she is feeling considerably better, she is safe for discharge.  I am discharging her with prescriptions for pantoprazole , ondansetron  oral dissolving tablet, and a small number of oxycodone  tablets.  I have discussed return precautions with her, also the importance of following up for her routine dialysis sessions.   CRITICAL CARE Performed by: Alm Lias Total critical care time: 35 minutes Critical care time was exclusive of separately billable procedures and treating other patients. Critical care was necessary to treat or prevent imminent or life-threatening deterioration. Critical care was time spent personally by me on the following activities: development of treatment plan with patient and/or surrogate as well as nursing, discussions with consultants, evaluation of patient's response to treatment, examination of patient, obtaining history from patient or surrogate, ordering and performing treatments and interventions, ordering and review of laboratory studies, ordering and review of radiographic studies, pulse oximetry and re-evaluation of patient's condition.     Final diagnoses:  LUQ abdominal pain  Nausea and vomiting, unspecified vomiting type  End-stage renal disease on hemodialysis (HCC)  Poorly-controlled hypertension    ED Discharge Orders          Ordered     pantoprazole  (PROTONIX ) 40 MG tablet  Daily        01/14/25 0641    ondansetron  (ZOFRAN -ODT) 4 MG disintegrating tablet  Every 8 hours PRN        01/14/25 0641    oxyCODONE  (ROXICODONE ) 5 MG immediate release tablet  Every 4 hours PRN        01/14/25 0643               Lias Alm, MD 01/14/25 [phone]  "

## 2025-01-14 NOTE — ED Notes (Signed)
 Pt back from CT with IV access lost.

## 2025-01-14 NOTE — ED Notes (Signed)
 Patient transported to CT

## 2025-01-15 ENCOUNTER — Other Ambulatory Visit (HOSPITAL_COMMUNITY): Payer: Self-pay

## 2025-01-18 ENCOUNTER — Other Ambulatory Visit (HOSPITAL_COMMUNITY): Payer: Self-pay

## 2025-01-20 ENCOUNTER — Other Ambulatory Visit (HOSPITAL_COMMUNITY): Payer: Self-pay | Admitting: Emergency Medicine

## 2025-01-20 ENCOUNTER — Telehealth (HOSPITAL_COMMUNITY): Payer: Self-pay

## 2025-01-20 NOTE — Progress Notes (Signed)
 Med rec only x 1 week.  Nancy [name] was seen in the ED for abdominal pain and was discharged.  She was sleeping during my visit and Mr Laflamme asked that I not disturb her.   I advised him that I will need to make actual contact next visit for full vitals.      Mary Sharps, EMT-Paramedic [phone] 01/20/2025

## 2025-01-20 NOTE — Telephone Encounter (Signed)
 HF Paramedicine Team Based Care Meeting    [place] HF Paramedicine  Nancy [name] Nancy [name]  [facility] admit within the last 30 days- ED visits only for GI issues   Medications concerns- pill box by [name] she is compliant- pending bubble packs/adherence packaging   Transportation issues- none   Education needs- none   SDOH- follow up with GI and endocrinology   Eligible for discharge- 30-90 days with provider/pharmacy discretion

## 2025-01-21 ENCOUNTER — Telehealth: Payer: Self-pay | Admitting: Internal Medicine

## 2025-01-21 NOTE — Telephone Encounter (Signed)
Fax received and placed in providers box

## 2025-01-21 NOTE — Telephone Encounter (Signed)
 Copied from CRM [id number]. Topic: General - Phone/Fax/Address >> Jan 21, 2025  1:03 PM Rosaria E wrote:  From previous Reason for Contact - Other: Reason for CRM: Aida from [employer] on behalf of [employer] is faxing over a document requesting office visit notes and appt information. Provided fax  Called from: [phone]

## 2025-02-02 ENCOUNTER — Ambulatory Visit: Payer: Self-pay | Admitting: Internal Medicine

## 2025-03-22 ENCOUNTER — Ambulatory Visit: Admitting: Internal Medicine
